# Patient Record
Sex: Male | Born: 1940 | Race: White | Hispanic: No | Marital: Married | Smoking: Former smoker
Health system: Southern US, Community
[De-identification: ages and names within clinical notes are randomized; demographics above are authoritative.]

## PROBLEM LIST (undated history)

## (undated) DIAGNOSIS — Z8679 Personal history of other diseases of the circulatory system: Secondary | ICD-10-CM

## (undated) DIAGNOSIS — H35039 Hypertensive retinopathy, unspecified eye: Secondary | ICD-10-CM

## (undated) DIAGNOSIS — F32A Depression, unspecified: Secondary | ICD-10-CM

## (undated) DIAGNOSIS — I43 Cardiomyopathy in diseases classified elsewhere: Secondary | ICD-10-CM

## (undated) DIAGNOSIS — I5032 Chronic diastolic (congestive) heart failure: Secondary | ICD-10-CM

## (undated) DIAGNOSIS — K219 Gastro-esophageal reflux disease without esophagitis: Secondary | ICD-10-CM

## (undated) DIAGNOSIS — I2510 Atherosclerotic heart disease of native coronary artery without angina pectoris: Secondary | ICD-10-CM

## (undated) DIAGNOSIS — M1990 Unspecified osteoarthritis, unspecified site: Secondary | ICD-10-CM

## (undated) DIAGNOSIS — Z8719 Personal history of other diseases of the digestive system: Secondary | ICD-10-CM

## (undated) DIAGNOSIS — I484 Atypical atrial flutter: Secondary | ICD-10-CM

## (undated) DIAGNOSIS — Q2112 Patent foramen ovale: Secondary | ICD-10-CM

## (undated) DIAGNOSIS — L0390 Cellulitis, unspecified: Secondary | ICD-10-CM

## (undated) DIAGNOSIS — I119 Hypertensive heart disease without heart failure: Secondary | ICD-10-CM

## (undated) DIAGNOSIS — N183 Chronic kidney disease, stage 3 unspecified: Secondary | ICD-10-CM

## (undated) DIAGNOSIS — K5790 Diverticulosis of intestine, part unspecified, without perforation or abscess without bleeding: Secondary | ICD-10-CM

## (undated) DIAGNOSIS — D649 Anemia, unspecified: Secondary | ICD-10-CM

## (undated) DIAGNOSIS — D3500 Benign neoplasm of unspecified adrenal gland: Secondary | ICD-10-CM

## (undated) DIAGNOSIS — J90 Pleural effusion, not elsewhere classified: Secondary | ICD-10-CM

## (undated) DIAGNOSIS — F419 Anxiety disorder, unspecified: Secondary | ICD-10-CM

## (undated) DIAGNOSIS — Z86010 Personal history of colonic polyps: Secondary | ICD-10-CM

## (undated) DIAGNOSIS — Q211 Atrial septal defect: Secondary | ICD-10-CM

## (undated) DIAGNOSIS — E785 Hyperlipidemia, unspecified: Secondary | ICD-10-CM

## (undated) DIAGNOSIS — E119 Type 2 diabetes mellitus without complications: Secondary | ICD-10-CM

## (undated) DIAGNOSIS — G4733 Obstructive sleep apnea (adult) (pediatric): Secondary | ICD-10-CM

## (undated) DIAGNOSIS — M109 Gout, unspecified: Secondary | ICD-10-CM

## (undated) DIAGNOSIS — Z9289 Personal history of other medical treatment: Secondary | ICD-10-CM

## (undated) DIAGNOSIS — Z860101 Personal history of adenomatous and serrated colon polyps: Secondary | ICD-10-CM

## (undated) DIAGNOSIS — J9690 Respiratory failure, unspecified, unspecified whether with hypoxia or hypercapnia: Secondary | ICD-10-CM

## (undated) DIAGNOSIS — Z9889 Other specified postprocedural states: Secondary | ICD-10-CM

## (undated) DIAGNOSIS — C4491 Basal cell carcinoma of skin, unspecified: Secondary | ICD-10-CM

## (undated) DIAGNOSIS — Q263 Partial anomalous pulmonary venous connection: Secondary | ICD-10-CM

## (undated) DIAGNOSIS — I4819 Other persistent atrial fibrillation: Secondary | ICD-10-CM

## (undated) DIAGNOSIS — I10 Essential (primary) hypertension: Secondary | ICD-10-CM

## (undated) DIAGNOSIS — H3530 Unspecified macular degeneration: Secondary | ICD-10-CM

## (undated) DIAGNOSIS — F329 Major depressive disorder, single episode, unspecified: Secondary | ICD-10-CM

## (undated) HISTORY — DX: Unspecified macular degeneration: H35.30

## (undated) HISTORY — DX: Anemia, unspecified: D64.9

## (undated) HISTORY — DX: Unspecified osteoarthritis, unspecified site: M19.90

## (undated) HISTORY — DX: Major depressive disorder, single episode, unspecified: F32.9

## (undated) HISTORY — PX: OTHER SURGICAL HISTORY: SHX169

## (undated) HISTORY — PX: EYE SURGERY: SHX253

## (undated) HISTORY — PX: GREAT TOE ARTHRODESIS, INTERPHALANGEAL JOINT: SUR55

## (undated) HISTORY — DX: Depression, unspecified: F32.A

## (undated) HISTORY — PX: RETINAL DETACHMENT SURGERY: SHX105

## (undated) HISTORY — DX: Obstructive sleep apnea (adult) (pediatric): G47.33

## (undated) HISTORY — DX: Personal history of colonic polyps: Z86.010

## (undated) HISTORY — PX: POLYPECTOMY: SHX149

## (undated) HISTORY — DX: Type 2 diabetes mellitus without complications: E11.9

## (undated) HISTORY — DX: Partial anomalous pulmonary venous connection: Q26.3

## (undated) HISTORY — DX: Chronic diastolic (congestive) heart failure: I50.32

## (undated) HISTORY — DX: Essential (primary) hypertension: I10

## (undated) HISTORY — DX: Other persistent atrial fibrillation: I48.19

## (undated) HISTORY — DX: Diverticulosis of intestine, part unspecified, without perforation or abscess without bleeding: K57.90

## (undated) HISTORY — DX: Hypertensive heart disease without heart failure: I11.9

## (undated) HISTORY — DX: Cardiomyopathy in diseases classified elsewhere: I43

## (undated) HISTORY — DX: Personal history of adenomatous and serrated colon polyps: Z86.0101

## (undated) HISTORY — DX: Hyperlipidemia, unspecified: E78.5

## (undated) HISTORY — DX: Hypertensive retinopathy, unspecified eye: H35.039

## (undated) HISTORY — PX: TRAPEZIUM RESECTION: SHX2576

## (undated) HISTORY — PX: CATARACT EXTRACTION: SUR2

## (undated) HISTORY — PX: CARDIAC CATHETERIZATION: SHX172

## (undated) HISTORY — PX: BASAL CELL CARCINOMA EXCISION: SHX1214

## (undated) HISTORY — DX: Atypical atrial flutter: I48.4

---

## 1997-12-31 ENCOUNTER — Ambulatory Visit: Admission: RE | Admit: 1997-12-31 | Discharge: 1997-12-31 | Payer: Self-pay | Admitting: Internal Medicine

## 1999-01-06 DIAGNOSIS — K5790 Diverticulosis of intestine, part unspecified, without perforation or abscess without bleeding: Secondary | ICD-10-CM

## 1999-01-06 HISTORY — DX: Diverticulosis of intestine, part unspecified, without perforation or abscess without bleeding: K57.90

## 1999-11-03 ENCOUNTER — Encounter (INDEPENDENT_AMBULATORY_CARE_PROVIDER_SITE_OTHER): Payer: Self-pay

## 1999-11-03 ENCOUNTER — Encounter: Payer: Self-pay | Admitting: Gastroenterology

## 1999-11-03 ENCOUNTER — Other Ambulatory Visit: Admission: RE | Admit: 1999-11-03 | Discharge: 1999-11-03 | Payer: Self-pay | Admitting: Gastroenterology

## 1999-11-04 ENCOUNTER — Ambulatory Visit (HOSPITAL_COMMUNITY): Admission: RE | Admit: 1999-11-04 | Discharge: 1999-11-04 | Payer: Self-pay | Admitting: Orthopedic Surgery

## 1999-11-04 ENCOUNTER — Encounter: Payer: Self-pay | Admitting: Orthopedic Surgery

## 1999-12-16 ENCOUNTER — Ambulatory Visit (HOSPITAL_COMMUNITY): Admission: RE | Admit: 1999-12-16 | Discharge: 1999-12-16 | Payer: Self-pay | Admitting: Orthopedic Surgery

## 1999-12-16 ENCOUNTER — Encounter: Payer: Self-pay | Admitting: Orthopedic Surgery

## 2001-03-08 ENCOUNTER — Encounter: Payer: Self-pay | Admitting: Cardiology

## 2001-03-08 ENCOUNTER — Ambulatory Visit (HOSPITAL_COMMUNITY): Admission: RE | Admit: 2001-03-08 | Discharge: 2001-03-08 | Payer: Self-pay | Admitting: Cardiology

## 2002-05-02 ENCOUNTER — Encounter: Payer: Self-pay | Admitting: *Deleted

## 2002-05-02 ENCOUNTER — Emergency Department (HOSPITAL_COMMUNITY): Admission: EM | Admit: 2002-05-02 | Discharge: 2002-05-02 | Payer: Self-pay | Admitting: *Deleted

## 2003-09-07 ENCOUNTER — Encounter: Payer: Self-pay | Admitting: Gastroenterology

## 2004-07-29 ENCOUNTER — Ambulatory Visit (HOSPITAL_COMMUNITY): Admission: RE | Admit: 2004-07-29 | Discharge: 2004-07-29 | Payer: Self-pay | Admitting: Cardiology

## 2005-05-18 ENCOUNTER — Inpatient Hospital Stay (HOSPITAL_COMMUNITY): Admission: AD | Admit: 2005-05-18 | Discharge: 2005-05-22 | Payer: Self-pay | Admitting: Cardiology

## 2006-06-12 ENCOUNTER — Encounter: Admission: RE | Admit: 2006-06-12 | Discharge: 2006-06-12 | Payer: Self-pay | Admitting: Cardiology

## 2006-10-01 ENCOUNTER — Emergency Department (HOSPITAL_COMMUNITY): Admission: EM | Admit: 2006-10-01 | Discharge: 2006-10-01 | Payer: Self-pay | Admitting: Emergency Medicine

## 2006-12-10 ENCOUNTER — Ambulatory Visit (HOSPITAL_COMMUNITY): Admission: RE | Admit: 2006-12-10 | Discharge: 2006-12-10 | Payer: Self-pay | Admitting: Internal Medicine

## 2006-12-11 ENCOUNTER — Encounter: Payer: Self-pay | Admitting: Gastroenterology

## 2006-12-14 ENCOUNTER — Ambulatory Visit: Admission: RE | Admit: 2006-12-14 | Discharge: 2006-12-14 | Payer: Self-pay | Admitting: Internal Medicine

## 2006-12-20 ENCOUNTER — Ambulatory Visit (HOSPITAL_COMMUNITY): Admission: RE | Admit: 2006-12-20 | Discharge: 2006-12-20 | Payer: Self-pay | Admitting: Internal Medicine

## 2006-12-21 ENCOUNTER — Encounter: Payer: Self-pay | Admitting: Gastroenterology

## 2006-12-21 ENCOUNTER — Ambulatory Visit (HOSPITAL_COMMUNITY): Admission: RE | Admit: 2006-12-21 | Discharge: 2006-12-21 | Payer: Self-pay | Admitting: Gastroenterology

## 2006-12-24 ENCOUNTER — Ambulatory Visit: Payer: Self-pay | Admitting: Gastroenterology

## 2007-01-01 ENCOUNTER — Emergency Department (HOSPITAL_COMMUNITY): Admission: EM | Admit: 2007-01-01 | Discharge: 2007-01-02 | Payer: Self-pay | Admitting: Emergency Medicine

## 2007-01-03 ENCOUNTER — Ambulatory Visit: Payer: Self-pay | Admitting: Gastroenterology

## 2007-01-03 LAB — CONVERTED CEMR LAB
Basophils Absolute: 0 10*3/uL (ref 0.0–0.1)
Basophils Relative: 0.4 % (ref 0.0–1.0)
Eosinophils Relative: 0.9 % (ref 0.0–5.0)
HCT: 27.7 % — ABNORMAL LOW (ref 39.0–52.0)
Monocytes Absolute: 0.6 10*3/uL (ref 0.2–0.7)
Neutrophils Relative %: 72.9 % (ref 43.0–77.0)
Platelets: 265 10*3/uL (ref 150–400)
RDW: 12.5 % (ref 11.5–14.6)
WBC: 10.6 10*3/uL — ABNORMAL HIGH (ref 4.5–10.5)

## 2007-01-04 ENCOUNTER — Ambulatory Visit: Payer: Self-pay | Admitting: Gastroenterology

## 2007-01-04 LAB — CONVERTED CEMR LAB
Basophils Absolute: 0 10*3/uL (ref 0.0–0.1)
Calcium: 8.8 mg/dL (ref 8.4–10.5)
Chloride: 104 meq/L (ref 96–112)
Eosinophils Absolute: 0.1 10*3/uL (ref 0.0–0.6)
GFR calc non Af Amer: 89 mL/min
HCT: 26.1 % — ABNORMAL LOW (ref 39.0–52.0)
Monocytes Relative: 4.6 % (ref 3.0–11.0)
RBC: 2.87 M/uL — ABNORMAL LOW (ref 4.22–5.81)
RDW: 12.6 % (ref 11.5–14.6)
WBC: 7.9 10*3/uL (ref 4.5–10.5)

## 2007-02-19 ENCOUNTER — Emergency Department (HOSPITAL_COMMUNITY): Admission: EM | Admit: 2007-02-19 | Discharge: 2007-02-19 | Payer: Self-pay | Admitting: Emergency Medicine

## 2007-03-15 ENCOUNTER — Encounter: Admission: RE | Admit: 2007-03-15 | Discharge: 2007-03-15 | Payer: Self-pay | Admitting: Cardiology

## 2008-08-30 ENCOUNTER — Telehealth: Payer: Self-pay | Admitting: Gastroenterology

## 2008-08-31 ENCOUNTER — Telehealth: Payer: Self-pay | Admitting: Gastroenterology

## 2008-09-03 ENCOUNTER — Ambulatory Visit: Payer: Self-pay | Admitting: Internal Medicine

## 2008-09-03 DIAGNOSIS — Z860100 Personal history of colon polyps, unspecified: Secondary | ICD-10-CM | POA: Insufficient documentation

## 2008-09-03 DIAGNOSIS — E782 Mixed hyperlipidemia: Secondary | ICD-10-CM

## 2008-09-03 DIAGNOSIS — K5730 Diverticulosis of large intestine without perforation or abscess without bleeding: Secondary | ICD-10-CM | POA: Insufficient documentation

## 2008-09-03 DIAGNOSIS — I480 Paroxysmal atrial fibrillation: Secondary | ICD-10-CM

## 2008-09-04 ENCOUNTER — Ambulatory Visit: Payer: Self-pay | Admitting: Gastroenterology

## 2008-09-04 ENCOUNTER — Ambulatory Visit (HOSPITAL_COMMUNITY): Admission: RE | Admit: 2008-09-04 | Discharge: 2008-09-04 | Payer: Self-pay | Admitting: Gastroenterology

## 2008-11-16 ENCOUNTER — Encounter: Payer: Self-pay | Admitting: Gastroenterology

## 2008-12-03 ENCOUNTER — Telehealth: Payer: Self-pay | Admitting: Gastroenterology

## 2008-12-17 ENCOUNTER — Ambulatory Visit: Payer: Self-pay | Admitting: Gastroenterology

## 2009-02-04 ENCOUNTER — Ambulatory Visit (HOSPITAL_COMMUNITY): Admission: RE | Admit: 2009-02-04 | Discharge: 2009-02-04 | Payer: Self-pay | Admitting: Internal Medicine

## 2009-09-03 ENCOUNTER — Ambulatory Visit: Payer: Self-pay | Admitting: Cardiology

## 2009-09-05 ENCOUNTER — Ambulatory Visit: Payer: Self-pay | Admitting: Cardiology

## 2010-03-07 ENCOUNTER — Ambulatory Visit (INDEPENDENT_AMBULATORY_CARE_PROVIDER_SITE_OTHER): Payer: Medicare Other | Admitting: Cardiology

## 2010-03-07 DIAGNOSIS — I10 Essential (primary) hypertension: Secondary | ICD-10-CM

## 2010-03-07 DIAGNOSIS — I4891 Unspecified atrial fibrillation: Secondary | ICD-10-CM

## 2010-05-08 ENCOUNTER — Other Ambulatory Visit: Payer: Self-pay | Admitting: *Deleted

## 2010-05-08 MED ORDER — VALSARTAN 320 MG PO TABS: 320.0000 mg | ORAL_TABLET | Freq: Every day | ORAL | Status: DC

## 2010-05-08 NOTE — Telephone Encounter (Signed)
Fax received from pharmacy. Refill completed. Jodette Marcie Shearon RN  

## 2010-05-20 NOTE — Assessment & Plan Note (Signed)
[name] OFFICE NOTE   NAME:BECKKamali, Meng                         MRN:          [id number]  DATE:01/04/2007                            DOB:          Aug 09, 1940    PRIMARY GASTROENTEROLOGIST:  Dr. Alben Spittle.   Mr. Normoyle feels great today.  He has no lightheadedness, no shortness of  breath, no chest pressures.  He has not had any rectal bleeding in over  24 hours.  The last was about 10 a.m. yesterday before he saw me in the  office.  He got a CBC this morning, and it shows his hemoglobin is 9.1  which is down from 9.8 yesterday and down from 14 over the weekend.  His  basic metabolic profile was normal.  He is out with his friends  currently, and I contacted him on his cell phone.   The bleeding has clinically stopped.  He feels well and has no organ  symptoms from his anemia.  I suspect holding the 4-8 aspirin a day has  made the difference and allowed him to achieve hemostasis.  He knows to  get in touch if he has any further bleeding or if he is just simply  feeling poorly.     Milus Banister, MD  Electronically Signed    DPJ/MedQ  DD: 01/04/2007  DT: 01/04/2007  Job #: [id number]   cc:   Sandy Salaam. Deatra Ina, MD,FACG

## 2010-05-20 NOTE — Assessment & Plan Note (Signed)
[place] OFFICE NOTE   NAME:[name], [name]                         MRN:          [id number]  DATE:01/03/2007                            DOB:          1940-10-16    PRIMARY CARE PHYSICIAN:  [name], M.D.   PRIMARY GASTROENTEROLOGIST:  Sandy Salaam. Deatra Ina, M.D.   GI PROBLEM LIST:  1. Family history of colorectal cancer. Status post colonoscopy      December 2008. A single polyp was removed. I do not have the path      back from that yet.  2. Foreign body removal from colonoscopy December 21, 2006. The      patient swallowed his dental bridge and it was lodged in his      terminal ileum. This was removed with a Roth basket by Dr. Erskine Emery.   INTERVAL HISTORY:  Mr. Mccredie was last seen with weeks ago by Dr. Erskine Emery for foreign body removal from his colon. He had swallowed a  dental bridge and Dr. Deatra Ina removed it with a Jabier Mutton basket from his  terminal ileum. He also removed a 6-mm sessile polyp with snare and  cautery method. Mr. Sedivy takes 4-8 aspirin a day every day for severe  arthritis pains. He began having overt red rectal bleeding two days ago.  He presented to the emergency room at [facility]. Hemoglobin was found  to be 14, platelets 269. A basic metabolic profile was essentially  normal. Complete metabolic profile was normal. He was sent home and  asked to followup here as he was hemodynamically stable. He has  continued to have red rectal bleeding. He believes it is slowing down.  He said he was going every 2 hours for a while but has not had to move  his bowel and has had no urge to move his bowel for about 5-6 hours. He  has no chest pain, no shortness of breath, no light-headedness when  standing and no dizziness. He had a CBC done just prior to this visit  showing his hemoglobin was now 9.8 (down from 14 two days ago).   CURRENT MEDICATIONS:  Amlodipine, clonidine,  glimepiride, Diovan,  metformin, Minoxidil, potassium, Tikosyn, Lipitor, calcium,  diphenhydramine, vitamin D3, aspirin 325 mg 4-8 a day.   PHYSICAL EXAMINATION:  VITAL SIGNS:  Blood pressure sitting 124/66,  supine 120/60, standing 102/60, heart rate 80s.  CONSTITUTIONAL:  Generally well- appearing.  NEUROLOGIC:  Alert and oriented x3.  ABDOMEN:  Soft, nontender, nondistended, normal bowel sounds.  EXTREMITIES:  No lower extremity edema.   ASSESSMENT/PLAN:  This is a 70 year old man with red rectal bleeding  post polypectomy two weeks.   His 4-8 aspirin a day are undoubtedly contributing to his rectal  bleeding. He stopped the aspirin Saturday and has not had anymore. He  has no orthostatic symptoms and although his hemoglobin has dropped 4  grams in 48 hours, he has not had a bloody bowel movement in 5-6 hours  now, feels well and does not feel like being admitted to  the hospital.  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 He knows that  I am on call and I have instructed him to get another CBC as well as a  basic metabolic profile tomorrow morning here at [facility] and he will  call us in the morning with the report on his bleeding. He knows that if  he begins having more frequent bleeding again to get in touch with me  tonight and my plan at that point would be to admit him to the hospital  and potentially observe him versus prepping him for a colonoscopy.     Milus Banister, MD  Electronically Signed    DPJ/MedQ  DD: 01/03/2007  DT: 01/03/2007  Job #: [id number]   cc:   [name], M.D.  Sandy Salaam. Deatra Ina, MD,FACG

## 2010-05-23 NOTE — Discharge Summary (Signed)
[name], WELL NO.:  [id number]   MEDICAL RECORD NO.:  [id number]          PATIENT TYPE:  INP   LOCATION:  3706                         FACILITY:  [facility]   PHYSICIAN:  Ludwig Lean. Doreatha Lew, M.D.DATE OF BIRTH:  1940/08/02   DATE OF ADMISSION:  05/18/2005  DATE OF DISCHARGE:  05/22/2005                                 DISCHARGE SUMMARY   PRIMARY DISCHARGE DIAGNOSIS:  Atrial fibrillation, subsequently loaded with  Tikosyn and with conversion to normal sinus rhythm with first degree AV  block.   SECONDARY DISCHARGE DIAGNOSES:  1.  Hypertensive heart disease.  2.  Chronic Coumadin therapy.  3.  Noninsulin-dependent diabetes.  4.  Hyperlipidemia.  5.  History of atherosclerotic cardiovascular disease, managed medically.  6.  Past history of short-lived results with cardioversion in July 2006.   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  He has had a history of atrial fibrillation in the past  and has had referral to Dr. Tally Due at [facility] for the possibility of  ablation.  It has been suggested a trial of antiarrhythmics with Tikosyn as  well as possible attempts at repeat electrical cardioversion should be  carried out.  He was subsequently planned for admission earlier this year;  however, we have had difficulty in maintaining therapeutic INRs.  Over this  past month his INR has been therapeutic in the 2-3 range and he was  subsequently admitted for elective loading with Tikosyn.   Please see the dictated history and physical for the patient presentation  and profile.   LABORATORY DATA:  On admission his magnesium was 2.3.  INR was 2.6.  CBC was  normal.  Chemistry showed a sodium of 138, potassium was 4.0, BUN 14,  creatinine 1, glucose was 118.  EKG showed atrial fibrillation initially  with satisfactory intervals.  His chest x-ray showed cardiac enlargement  with mild vascular congestion and no  acute heart failure.  There was mild  chronic lung disease.   HOSPITAL COURSE:  The patient was admitted.  Tikosyn orders were initiated  and the patient was subsequently started on 500 mg q.12h.  This dose was  tolerated well.  We did have a drop in potassium level, and this has been  replaced accordingly.  He has had one short run of seven beats of  ventricular tachycardia in the early morning hours on May 21, 2005, without  recurrence.  He has been asymptomatic.  His overall physical exam is  unremarkable.  Today on May 22, 2005, he is doing well without complaints.  He has had no further episodes of arrhythmia.  He has converted to normal  sinus rhythm with first degree AV block, his rate in the 70s.  His Toprol  has been discontinued.  Potassium is at 4.1.  QT intervals are satisfactory,  and he is felt to be a stable candidate for discharge today.   DISCHARGE CONDITION:  Stable.   DISCHARGE MEDICATIONS:  1.  We will be stopping Toprol.  2.  He is to  resume his Coumadin 10 mg Monday, Wednesday, Friday, 7.5 mg the      other days.  3.  Norvasc 5 mg two times a day.  4.  Diovan 320 mg a day.  5.  Amaryl 1 mg a day.  6.  Lipitor 40 mg three times a week.  7.  Minoxidil 10 mg at bedtime.  8.  Glucophage XR 1000 mg b.i.d.  9.  Tikosyn 500 mcg every 12 hours.  10. Potassium 20 mEq b.i.d.   His activity is not to be restricted.  His diet is diabetic heart-healthy.   We will plan on checking his chemistries in the office on Monday.  We will  plan on seeing him back in less than one week with a repeat EKG and repeat  lab work at that time.  He is to call if any problems arise in the interim.      [name], N.P.      Ludwig Lean. Doreatha Lew, M.D.  Electronically Signed    LC/MEDQ  D:  05/22/2005  T:  05/22/2005  Job:  [id number]   cc:   Tally Due, M.D.  [facility]   Tammi Sou, MD  Fax: [phone]

## 2010-05-23 NOTE — Procedures (Signed)
[place]  Patient:    [name], [name] Visit Number: [id number] MRN: [id number]          Service Type: CAT Location: [place] 01 Attending Physician:  Worthy Flank Dictated by:   Ludwig Lean Doreatha Lew, M.D. Proc. Date: 03/08/01 Admit Date:  03/08/2001 Discharge Date: 03/08/2001                             Procedure Report  REFERRING PHYSICIAN:  Kirtland Bouchard, M.D.  INDICATION FOR PROCEDURE:  Atrial fibrillation.  HISTORY OF PRESENT ILLNESS:  The patient is a 70 year old male with hypertensive heart disease. He was referred for evaluation and treatment of atrial fibrillation and flutter. He has been anticoagulated over the last month. He is referred for elective cardioversion.  PROCEDURE:  Elective cardioversion.  ANESTHESIA:  Ala Dach, M.D. with 300 mg Pentothal IV.  DESECRIPTION OF PROCEDURE:  With anterior and posterior paddles and biphasic shocks, 300 watt seconds of energy was delivered with conversion to normal sinus rhythm. The patient tolerated the procedure well. Dictated by:   Ludwig Lean Doreatha Lew, M.D. Attending Physician:  Worthy Flank DD:  03/08/01 TD:  03/08/01 Job: [id number]

## 2010-05-23 NOTE — H&P (Signed)
[name], [name] NO.:  [id number]   MEDICAL RECORD NO.:  [id number]           PATIENT TYPE:   LOCATION:                                 FACILITY:   PHYSICIAN:  Ludwig Lean. Doreatha Lew, M.D.    DATE OF BIRTH:   DATE OF ADMISSION:  05/18/2005  DATE OF DISCHARGE:                                HISTORY & PHYSICAL   CHIEF COMPLAINT:  None.   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  He has had a history of atrial fibrillation in the past.  He  has been reluctant to have antiarrhythmic therapy and has been referred for  ablative procedures at [facility] with Dr. Tally Due.  It has been suggested  to him that a trial with antiarrhythmic therapy with Tikosyn and possible  attempts at repeat electrical cardioversion prior to the ablation be carried  out.  He is now admitted for elective admission for Tikosyn with plans for  subsequent cardioversion.  We have initially attempted this procedure  earlier this year; however, we have had difficulty in maintaining  therapeutic INRs.  His Coumadin has now been therapeutic over the course of  the past one month.   PAST MEDICAL HISTORY:  1.  Atrial fibrillation.  He had previous cardioversion in July, 2006 with      short-lived results.  2.  Hypertensive heart disease with 2D echocardiogram in July, 2006 showing      moderate left atrial enlargement, moderate right atrial enlargement,      severe LVH, and normal LV systolic function.  3.  Chronic Coumadin.  4.  Non-insulin-dependent diabetes.  5.  Bilateral lens implants in 2000.  6.  Retinal surgery on the right eye in 1996.  7.  History of pilonidal cyst.  8.  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   ALLERGIES:  None.   CURRENT MEDICATIONS:  1.   Tylenol twice daily.  2.  Amaryl 1 mg a day.  3.  Minoxidil 10 mg a day.  4.  Diovan 320 a day.  5.  Paxil 10 mg a day.  6.  Norvasc 5 b.i.d.  7.  Toprol 100 b.i.d.  8.  Lipitor 40 mg 3 times a week only.  9.  Coumadin, currently taking 10 mg three days a week and 7.5 four days a      week.  10. Glucophage 1000 b.i.d.   FAMILY HISTORY:  Noncontributory.   SOCIAL HISTORY:  He is a retired Software engineer.  He has not smoked for over 25  years.  He uses social alcohol.   REVIEW OF SYSTEMS:  Otherwise unremarkable.   PHYSICAL EXAMINATION:  GENERAL:  He is a pleasant middle-aged male currently  in no acute distress.  VITAL SIGNS:  Blood pressure 130/90 sitting, 140/90 standing, heart rate 60  and irregular, respirations 18.  He  is afebrile.  SKIN:  Warm and dry.  Color is unremarkable.  LUNGS:  Clear.  HEART:  Irregularly irregular rhythm.  ABDOMEN:  Soft with positive bowel sounds.  Nontender.  EXTREMITIES:  Without edema.  NEUROLOGIC:  Intact.  There are no gross focal deficits.   Labs are pending.   IMPRESSION:  1.  Atrial fibrillation with plans for upcoming possible ablation.  2.  Chronic Coumadin.  3.  Hypertensive heart disease.  4.  History of atherosclerotic cardiovascular disease.  5.  Non-insulin-dependent diabetes.   PLAN:  He will be admitted on Monday, May 18, 2005 for [place] with Tikosyn.  Plans are made to have tentative cardioversion on Wednesday, May 16th, case  [id number].  Home medicines will be continued.      [name], N.P.      Ludwig Lean. Doreatha Lew, M.D.  Electronically Signed    LC/MEDQ  D:  05/06/2005  T:  05/06/2005  Job:  [id number]   cc:   Tammi Sou, MD  Fax: [phone]   Dr. Tally Due  [facility]

## 2010-05-23 NOTE — Cardiovascular Report (Signed)
NAMENEKHI, OEN NO.:  [id number]   MEDICAL RECORD NO.:  [id number]          PATIENT TYPE:  OIB   LOCATION:  2860                         FACILITY:  [facility]   PHYSICIAN:  Ludwig Lean. Doreatha Lew, M.D.DATE OF BIRTH:  Oct 27, 1940   DATE OF PROCEDURE:  07/29/2004  DATE OF DISCHARGE:                              CARDIAC CATHETERIZATION   PROCEDURE:  Cardioversion.   ANESTHESIA:  Dr. Jenita Seashore with 200 mg of Pentothal IV.   PROCEDURE:  Using anterior and posterior paddles with 150-watt seconds of  biphasic energy, the patient has converted from atrial fibrillation into  normal sinus rhythm.  He tolerated the procedure well.       SNT/MEDQ  D:  07/29/2004  T:  07/29/2004  Job:  [id number]

## 2010-05-23 NOTE — H&P (Signed)
NAMEDAUNDRE, HUNTSBERGER NO.:  [id number]   MEDICAL RECORD NO.:  [id number]          PATIENT TYPE:  OIB   LOCATION:                               FACILITY:  [facility]   PHYSICIAN:  Ludwig Lean. Doreatha Lew, M.D.DATE OF BIRTH:  [birth year]   DATE OF ADMISSION:  07/29/2004  DATE OF DISCHARGE:                                HISTORY & PHYSICAL   CHIEF COMPLAINT:  Fatigue.   HISTORY OF PRESENT ILLNESS:  Mr. Baggott is a very pleasant 70 year old white  male. He has had recurrent atrial fibrillation.  He has been placed back on  Coumadin anticoagulation.  He presents for attempts at elective  cardioversion to restore normal sinus rhythm.  Clinically, he has had  problems with ongoing weakness and fatigue.  He has had no lightheadedness,  no dizziness, no syncope. He has had no chest pain or shortness of breath.   PAST MEDICAL HISTORY:  1.  Prior history of atrial fibrillation.  2.  Past history of Coumadin anticoagulation.  3.  Diabetes.  4.  Previous history of cardiac catheterization dating back to 1996      secondary to chest pain.  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  5.  Chronic PVC's.  6.  Hypertension.  7.  Non-insulin dependent diabetes.  8.  ACE intolerance secondary to cough.   ALLERGIES:  NONE KNOWN.   CURRENT MEDICATIONS:  1.  Coumadin 7.5 x6, 5 x1.  2.  Baby aspirin daily.  3.  Lipitor 40 mg three times a week.  4.  Toprol XL 100 mg b.i.d.  5.  Norvasc 5 b.i.d.  6.  Paxil 20 mg a day.  7.  Glucophage 1000 mg b.i.d.  8.  Diovan 320 mg a day.  9.  Minoxidil 10 mg at bedtime.   FAMILY HISTORY:  Father and mother have both lived up into their 63's.  There is no significant cardiovascular disease.  He does have a significant  family history of colon cancer.   SOCIAL HISTORY:  He is  currently retired as a Software engineer.  He is married.   REVIEW OF SYSTEMS:  As noted above.  There may be some degree of shortness  of breath with overexertion.  No symptoms at rest.  He has had no paroxysmal  nocturnal dyspnea, no orthopnea.  No complaints of edema.  He is not  lightheaded or dizzy.  He does have problems with ongoing weakness and  fatigue.  He does have an awareness of his recurrent arrhythmia.   PHYSICAL EXAMINATION:  GENERAL:  On exam, he is a very pleasant male in no  acute distress.  VITAL SIGNS:  Blood pressure is 130/80 sitting, 130/70 standing, heart rate  72 and irregular.  His weight is 210 pounds.  SKIN:  Warm and dry.  Color is unremarkable.  LUNGS:  Basically clear.  CARDIAC:  Exam shows  an irregular irregular rhythm.  There is no murmur.  ABDOMEN:  Soft.  Positive bowel sounds.  Nontender.  EXTREMITIES:  Without edema.  NEUROLOGICAL:  Intact.  There are no gross focal deficits.   PERTINENT LABORATORIES:  Pending.   OVERALL IMPRESSION:  1.  Recurrent atrial fibrillation.  2.  Coumadin anticoagulation.  3.  Past history of cardiac catheterization.  4.  Hypertension, currently controlled.  5.  Diabetes.   PLAN:  Will proceed on with attempts at cardioversion in order to restore  sinus rhythm.  The procedure has been reviewed in full detail and he is  willing to proceed on Tuesday, July 29, 2004.       LC/MEDQ  D:  07/25/2004  T:  07/25/2004  Job:  [id number]   cc:   [name], M.D.  [address], [place]  [place], [place] [address]  Fax: [phone]

## 2010-05-28 ENCOUNTER — Other Ambulatory Visit: Payer: Self-pay | Admitting: *Deleted

## 2010-05-28 MED ORDER — MINOXIDIL 10 MG PO TABS: ORAL_TABLET | ORAL | Status: DC

## 2010-05-28 NOTE — Telephone Encounter (Signed)
Lotensin refilled

## 2010-06-04 ENCOUNTER — Encounter: Payer: Self-pay | Admitting: Physician Assistant

## 2010-06-04 ENCOUNTER — Ambulatory Visit (INDEPENDENT_AMBULATORY_CARE_PROVIDER_SITE_OTHER): Payer: Medicare Other | Admitting: Physician Assistant

## 2010-06-04 ENCOUNTER — Telehealth: Payer: Self-pay | Admitting: Gastroenterology

## 2010-06-04 ENCOUNTER — Other Ambulatory Visit (INDEPENDENT_AMBULATORY_CARE_PROVIDER_SITE_OTHER): Payer: Medicare Other | Admitting: Physician Assistant

## 2010-06-04 ENCOUNTER — Other Ambulatory Visit (INDEPENDENT_AMBULATORY_CARE_PROVIDER_SITE_OTHER): Payer: Medicare Other

## 2010-06-04 VITALS — BP 128/64 | HR 72 | Temp 99.1°F | Ht 71.0 in | Wt 205.0 lb

## 2010-06-04 DIAGNOSIS — K6289 Other specified diseases of anus and rectum: Secondary | ICD-10-CM

## 2010-06-04 DIAGNOSIS — N410 Acute prostatitis: Secondary | ICD-10-CM

## 2010-06-04 LAB — URINALYSIS, ROUTINE W REFLEX MICROSCOPIC
Ketones, ur: NEGATIVE
Specific Gravity, Urine: >=1.03 (ref 1.000–1.030)
Total Protein, Urine: 30
Urine Glucose: 500
pH: 5.5 (ref 5.0–8.0)

## 2010-06-04 LAB — CBC WITH DIFFERENTIAL/PLATELET
Basophils Relative: 0.5 % (ref 0.0–3.0)
Eosinophils Relative: 3.2 % (ref 0.0–5.0)
Hemoglobin: 13.6 g/dL (ref 13.0–17.0)
Lymphocytes Relative: 22 % (ref 12.0–46.0)
MCHC: 34.6 g/dL (ref 30.0–36.0)
Monocytes Relative: 13 % — ABNORMAL HIGH (ref 3.0–12.0)
Neutro Abs: 3.3 10*3/uL (ref 1.4–7.7)
RBC: 4.21 Mil/uL — ABNORMAL LOW (ref 4.22–5.81)

## 2010-06-04 MED ORDER — AMOXICILLIN 250 MG PO CAPS: 250.0000 mg | ORAL_CAPSULE | Freq: Four times a day (QID) | ORAL | Status: AC

## 2010-06-04 NOTE — Patient Instructions (Addendum)
We have sent a prescription for Amoxicillin to [facility]. We will call you once we get the lab results back.

## 2010-06-04 NOTE — Progress Notes (Signed)
Agree with initial assessment and plan 

## 2010-06-04 NOTE — Telephone Encounter (Signed)
Pt states that he is having the same symptoms that he had a couple of years ago when he saw Dr. Deatra Ina. He is c/o fever, perianal pain, lots of gas, and varying forms and types of stools. Pt states that Dr. Deatra Ina had instructed him to call and be seen if this happened again. Pt scheduled to see Amy Esterwood PA 06/04/10@3pm . Pt aware of appt date and time.

## 2010-06-04 NOTE — Progress Notes (Signed)
Subjective:    Patient ID: [name], male    DOB: 1940/10/26, 70 y.o.   MRN: [id number]  HPI [name] is a pleasant 70 year old white male known to Dr. Deatra Ina who has history of diverticulosis internal hemorrhoids and colon polyps. Last colonoscopy was done in August of 2010 showing mild diverticulosis and internal hemorrhoids with no recurrent polyps. Colonoscopy done in 2008 with removal of a dental bridge from the ileum and one polyp was removed which was benign. Patient comes in today as an acute work in after onset of illness on May 25 with fever and a diffuse body aches. He says he had intermittent fevers to 101 over the weekend which then started to subside these weren't this was associated with chills but he did not have any abdominal pain nausea vomiting diarrhea change in his bowel habits no cough or sputum production or any other localizing symptoms. He says he felt better on the 28th and then had fever come back again on the 29th. Today so far has not had a fever but says he feels hot currently here in the office. He noticed yesterday for the first time that he had some mild perirectal discomfort and perfect pressure after he though his lawn on the riding mower. He had  prior episodeS twice in the past with fever and perirectal discomfort with no definitive diagnosis. He says it was felt that he may have had prostatitis in the past and was treated with amoxicillin which alleviated his symptoms. The last episode was a couple of years ago. On further questioning he has had some mild dysuria as well over the past couple of days. He has not had any rectal bleeding.    Review of Systems  Constitutional: Positive for fever and chills.  HENT: Negative.   Eyes: Negative.   Respiratory: Negative.   Cardiovascular: Negative.   Gastrointestinal: Positive for rectal pain.  Genitourinary: Positive for dysuria.  Musculoskeletal: Negative.   Skin: Negative.   Neurological: Negative.   Hematological:  Negative.   Psychiatric/Behavioral: Negative.    Outpatient Encounter Prescriptions as of 06/04/2010  Medication Sig Dispense Refill  . acetaminophen (TYLENOL) 500 MG tablet Take 500 mg by mouth every 2 (two) hours as needed.        . ALPRAZolam (XANAX) 0.25 MG tablet Take 0.25 mg by mouth at bedtime as needed.        [name] AMLODIPINE BESYLATE PO Take 10 mg by mouth 2 (two) times daily.       [name] aspirin 325 MG tablet Take 2,600 mg by mouth daily.       [name] atorvastatin (LIPITOR) 20 MG tablet Take 20 mg by mouth daily. 3 times weekly       . calcium elemental as carbonate (CALCIUM ANTACID ULTRA MAX ST) 400 MG tablet Chew 1,000 mg by mouth daily.        . Cholecalciferol (VITAMIN D) 2000 UNITS CAPS Take 3 capsules by mouth daily.        . citalopram (CELEXA) 40 MG tablet Take 40 mg by mouth daily.        [name] CLONIDINE HCL PO Take 0.2 mg by mouth 3 (three) times daily.       . diphenhydrAMINE (BENADRYL) 25 MG tablet Take 25 mg by mouth every 6 (six) hours as needed.        . dofetilide (TIKOSYN) 500 MCG capsule Take 500 mcg by mouth 2 (two) times daily.       . furosemide (  LASIX) 20 MG tablet Take 20 mg by mouth every morning.       [name] glimepiride (AMARYL) 2 MG tablet Take 2 mg by mouth daily before breakfast.        . HYDROcodone-acetaminophen (VICODIN) 5-500 MG per tablet Take 1 tablet by mouth every 6 (six) hours as needed.        . metFORMIN (GLUCOPHAGE) 500 MG tablet Take 500 mg by mouth 2 (two) times daily with a meal. Take 2 tablets       . minoxidil (LONITEN) 10 MG tablet Take 2 tabs QHS  60 tablet  5  . Multiple Vitamin (MULTIVITAMIN) capsule Take 1 capsule by mouth daily.        . nitroGLYCERIN (NITROSTAT) 0.4 MG SL tablet Place 0.4 mg under the tongue every 5 (five) minutes as needed.        . potassium chloride SA (KLOR-CON M20) 20 MEQ tablet Take 20 mEq by mouth 3 (three) times daily.       . ranitidine (ZANTAC) 150 MG tablet Take 150 mg by mouth. 1 tab by mouth as needed.       . valsartan  (DIOVAN) 320 MG tablet Take 1 tablet (320 mg total) by mouth daily.  30 tablet  11  . Vitamins-Lipotropics (SUPER STRESS B-COMPLEX CR PO) Take by mouth.        [name] amoxicillin (AMOXIL) 250 MG capsule Take 1 capsule (250 mg total) by mouth 4 (four) times daily.  56 capsule  0  . DISCONTD: aliskiren (TEKTURNA) 300 MG tablet Take 300 mg by mouth at bedtime.        [name] DISCONTD: calcium-vitamin D (OSCAL WITH D) 500-200 MG-UNIT per tablet Take 1 tablet by mouth daily.             Objective:   Physical Exam Well-developed white male in no acute distress, pleasant, alert and oriented x3 HEENT nontraumatic normocephalic EOMI PERRLA sclera anicteric nECK; Supple no JVD  Cardiovascular; regular rate and rhythm with S1-S2 no significant murmur rub or gallop   Pulmonary; clear bilaterally   Abdomen; soft nontender nondistended bowel sounds active no palpable mass or hepatosplenomegaly  Rectal exam;  nontender to exam, no evidence for a perirectal inflammation or abscess prostate is somewhat enlarged and tender particularly on the left, stool trace heme positive. Psych; mood and affect are appropriate        Assessment & Plan:  #16 70 year old male with five-day history of fever generalized aching and 24-hour history of vague perirectal discomfort and dysuria. He has no evidence of any perirectal inflammation abscess Judithe Modest and exam is not consistent with diverticulitis. I suspect he does have prostatitis.  Plan; Check CBC and BMET, UA today Start amoxicillin 250 mg 4 times daily x14 days.  suggested patient be seen by urology.  #2 Diverticulosis  #3 History of colon polyps, and family history of colon cancer Last colonoscopy 2010 no recurrent polyps will be due for followup 2015.

## 2010-06-06 ENCOUNTER — Telehealth: Payer: Self-pay | Admitting: *Deleted

## 2010-06-06 NOTE — Telephone Encounter (Signed)
Message copied by Hulan Saas on Fri Jun 06, 2010  4:18 PM ------      Message from: Nicoletta Ba S      Created: Fri Jun 06, 2010  3:45 PM       Please call pt and see how he is feeling. Labs ok. I  Treated him for prostatitis

## 2010-06-06 NOTE — Telephone Encounter (Signed)
Results given. He is not having any more symptoms. He has gotten a sore throat which his PCP is treating.

## 2010-06-19 ENCOUNTER — Telehealth: Payer: Self-pay | Admitting: Gastroenterology

## 2010-06-19 NOTE — Telephone Encounter (Signed)
Pt states that he is still having problems with the pain that he was having and gas. States there is some mucous on the toilet paper when he wipes. Pt does not have a fever. He is requesting to be seen. Pt given an appt for 06/23/10@2pm . Pt aware of appt date and time.

## 2010-06-23 ENCOUNTER — Ambulatory Visit (INDEPENDENT_AMBULATORY_CARE_PROVIDER_SITE_OTHER): Payer: Medicare Other | Admitting: Nurse Practitioner

## 2010-06-23 ENCOUNTER — Encounter: Payer: Self-pay | Admitting: Nurse Practitioner

## 2010-06-23 VITALS — BP 124/60 | HR 80 | Ht 71.0 in | Wt 202.6 lb

## 2010-06-23 DIAGNOSIS — R109 Unspecified abdominal pain: Secondary | ICD-10-CM

## 2010-06-23 DIAGNOSIS — R102 Pelvic and perineal pain: Secondary | ICD-10-CM

## 2010-06-23 NOTE — Patient Instructions (Signed)
Pt is to call us on an as needed basis.

## 2010-06-23 NOTE — Progress Notes (Signed)
SHAYLON MANGAL [id number] 07-07-1940   HISTORY OR PRESENT ILLNESS : Mr. Worthington is a 70 year old male followed by Dr. Deatra Ina for history of diverticulosis and colon polyps. Patient was last seen in our office 06/04/10 for acute fever, vague perirectal discomfort and dysuria felt to be secondary to prostatitis. His CBC was unremarkable, urinalysis was negative for nitrites, leukocytes. He had 0-2 white blood cells and 0-2 RBCs in his urine.The patient was started on Amoxicillin. A couple of days later patient saw his PCP for a sore throat and there was discussion of patient's lower abdominal pain and dysuria as well. PCP apparently agreed that patient had prostatitis. Amoxicillin was discontinued, Doxycycline started. Patient called [place] on Friday with persistent "pelvic pain" and gas. He was given an appointment for today but explains that his symptoms actually abated over the weekend. No fevers, pain or dysuria.   Current Medications, Allergies, Past Medical History, Past Surgical History, Family History and Social History were reviewed in [employer] record.   PHYSICAL EXAMINATION : General: Well developed white male in no acute distress Head: Normocephalic and atraumatic Eyes:  sclerae anicteric,conjunctive pink. Ears: Normal auditory acuity Mouth: No deformity or lesions Neck: Supple, no masses.  Lungs: Clear throughout to auscultation Heart: Regular rate and rhythm; no murmurs heard Abdomen: Soft, nondistended, nontender. No masses or hepatomegaly noted. Normal bowel sounds Rectal: not done Musculoskeletal: Symmetrical with no gross deformities  Skin: No lesions on visible extremities Extremities: No edema or deformities noted Neurological: Alert oriented x 4, grossly nonfocal Cervical Nodes:  No significant cervical adenopathy Psychological:  Alert and cooperative. Normal mood and affect  ASSESSMENT AND PLAN :

## 2010-06-25 ENCOUNTER — Encounter: Payer: Self-pay | Admitting: Nurse Practitioner

## 2010-06-25 NOTE — Assessment & Plan Note (Signed)
Resolution of pelvic pain, dysuria and fevers after being treated empirically for prostatitis (we gave him Amoxicillin but PCP changed to Doxycycline). If symptoms recur patient will contact PCP for further evaluation.

## 2010-06-25 NOTE — Progress Notes (Signed)
Reviewed and agree.

## 2010-07-18 ENCOUNTER — Other Ambulatory Visit (HOSPITAL_COMMUNITY): Payer: Self-pay | Admitting: Internal Medicine

## 2010-07-18 ENCOUNTER — Ambulatory Visit (HOSPITAL_COMMUNITY)
Admission: RE | Admit: 2010-07-18 | Discharge: 2010-07-18 | Disposition: A | Discharge status: Home / Self Care | Payer: Medicare Other | Source: Ambulatory Visit | Attending: Internal Medicine | Admitting: Internal Medicine

## 2010-07-18 DIAGNOSIS — E119 Type 2 diabetes mellitus without complications: Secondary | ICD-10-CM | POA: Insufficient documentation

## 2010-07-18 DIAGNOSIS — I10 Essential (primary) hypertension: Secondary | ICD-10-CM | POA: Insufficient documentation

## 2010-07-18 DIAGNOSIS — Z77090 Contact with and (suspected) exposure to asbestos: Secondary | ICD-10-CM

## 2010-07-18 DIAGNOSIS — Z87891 Personal history of nicotine dependence: Secondary | ICD-10-CM | POA: Insufficient documentation

## 2010-08-05 ENCOUNTER — Other Ambulatory Visit: Payer: Self-pay | Admitting: *Deleted

## 2010-08-05 MED ORDER — DOFETILIDE 500 MCG PO CAPS: 500.0000 ug | ORAL_CAPSULE | Freq: Two times a day (BID) | ORAL | Status: DC

## 2010-08-05 NOTE — Telephone Encounter (Signed)
escribe medication per fax request  

## 2010-08-06 ENCOUNTER — Telehealth: Payer: Self-pay | Admitting: Cardiovascular Disease

## 2010-08-06 MED ORDER — DOFETILIDE 500 MCG PO CAPS: 500.0000 ug | ORAL_CAPSULE | Freq: Two times a day (BID) | ORAL | Status: DC

## 2010-08-06 NOTE — Telephone Encounter (Signed)
Former pt of Dr. Tawnya Crook, will be seeing Dr. Acie Fredrickson, pt states states [facility] in [place] called to tell patient they're not authorized to dispense , a script however it went to the wrong pharmacy at [place] on [address], this authorization needs to go to [facility] in [place], [place], please call pt to clarify explanation

## 2010-08-06 NOTE — Telephone Encounter (Signed)
Pt called and rx sent to other store.Corwin Levins RN

## 2010-08-19 ENCOUNTER — Encounter (INDEPENDENT_AMBULATORY_CARE_PROVIDER_SITE_OTHER): Payer: Medicare Other | Admitting: Ophthalmology

## 2010-08-21 ENCOUNTER — Encounter (INDEPENDENT_AMBULATORY_CARE_PROVIDER_SITE_OTHER): Payer: Medicare Other | Admitting: Ophthalmology

## 2010-08-21 DIAGNOSIS — H3530 Unspecified macular degeneration: Secondary | ICD-10-CM

## 2010-08-21 DIAGNOSIS — H33009 Unspecified retinal detachment with retinal break, unspecified eye: Secondary | ICD-10-CM

## 2010-08-21 DIAGNOSIS — H35379 Puckering of macula, unspecified eye: Secondary | ICD-10-CM

## 2010-08-21 DIAGNOSIS — E11319 Type 2 diabetes mellitus with unspecified diabetic retinopathy without macular edema: Secondary | ICD-10-CM

## 2010-09-04 ENCOUNTER — Encounter: Payer: Self-pay | Admitting: Cardiovascular Disease

## 2010-09-22 ENCOUNTER — Encounter: Payer: Self-pay | Admitting: Cardiovascular Disease

## 2010-09-22 ENCOUNTER — Ambulatory Visit (INDEPENDENT_AMBULATORY_CARE_PROVIDER_SITE_OTHER): Payer: Medicare Other | Admitting: Cardiovascular Disease

## 2010-09-22 VITALS — BP 146/78 | HR 68 | Wt 208.0 lb

## 2010-09-22 DIAGNOSIS — I4891 Unspecified atrial fibrillation: Secondary | ICD-10-CM

## 2010-09-22 NOTE — Progress Notes (Signed)
[name] Date of Birth  1940-05-03 [facility] [address]    [place], [place]  [address] [phone]  Fax  [phone]  History of Present Illness:  [name] is a 70 year old gentleman with a history of hypertension, atrial fibrillation and a myocardial bridge.  He is a previous patient of Dr. Patricia Pesa. He also has a history of diabetes. He has felt fairly well. He has not had any episodes of chest pain or shortness of breath.     Current Outpatient Prescriptions on File Prior to Visit  Medication Sig Dispense Refill  . acetaminophen (TYLENOL) 500 MG tablet Take 500 mg by mouth every 2 (two) hours as needed.        . ALPRAZolam (XANAX) 0.25 MG tablet Take by mouth at bedtime as needed. Taking 0.25mg  to 1mg  PRN      . AMLODIPINE BESYLATE PO Take 10 mg by mouth 2 (two) times daily.       [name] aspirin 325 MG tablet Take 325 mg by mouth. Taking 4 Tab BID      . atorvastatin (LIPITOR) 20 MG tablet Take 20 mg by mouth daily. 3 times weekly      . calcium elemental as carbonate (CALCIUM ANTACID ULTRA MAX ST) 400 MG tablet Chew by mouth. Taking PRN      . Cholecalciferol (VITAMIN D) 2000 UNITS CAPS Take 3 capsules by mouth daily.        . citalopram (CELEXA) 40 MG tablet Take 40 mg by mouth daily.        [name] CLONIDINE HCL PO Take 0.2 mg by mouth 3 (three) times daily.       . diphenhydrAMINE (BENADRYL) 25 MG tablet Take 25 mg by mouth every 6 (six) hours as needed.        . dofetilide (TIKOSYN) 500 MCG capsule Take 1 capsule (500 mcg total) by mouth 2 (two) times daily.  180 capsule  3  . furosemide (LASIX) 20 MG tablet Take 20 mg by mouth every morning.       [name] glimepiride (AMARYL) 2 MG tablet Take 2 mg by mouth daily before breakfast.        . HYDROcodone-acetaminophen (VICODIN) 5-500 MG per tablet Take 1 tablet by mouth every 6 (six) hours as needed.        . metFORMIN (GLUCOPHAGE) 500 MG tablet Take 500 mg by mouth 2 (two) times daily with a meal. Take 2 tablets      .  minoxidil (LONITEN) 10 MG tablet Take 2 tabs QHS  60 tablet  5  . Multiple Vitamin (MULTIVITAMIN) capsule Take 1 capsule by mouth daily.        . nitroGLYCERIN (NITROSTAT) 0.4 MG SL tablet Place 0.4 mg under the tongue every 5 (five) minutes as needed.        . potassium chloride SA (KLOR-CON M20) 20 MEQ tablet Take 20 mEq by mouth 3 (three) times daily.       . ranitidine (ZANTAC) 150 MG tablet Take 150 mg by mouth. 1 tab by mouth as needed.       . valsartan (DIOVAN) 320 MG tablet Take 1 tablet (320 mg total) by mouth daily.  30 tablet  11  . Vitamins-Lipotropics (SUPER STRESS B-COMPLEX CR PO) Take by mouth.          Allergies  Allergen Reactions  . Horse-Derived Products   . Other     Tetanus Shot    Past Medical History  Diagnosis Date  . Hx of adenomatous colonic polyps   . Diabetes mellitus type II   . Hypertension   . Diverticulosis 2001  . Hyperlipidemia   . Atrial fibrillation   . Joint pain   . Depression     Past Surgical History  Procedure Date  . Polinydal cyst     Removed  . Great toe arthrodesis, interphalangeal joint     Right foot  . Retina repair-right   . Cataract extraction     bilateral  . Basal cell carcinoma excision     x3 on face  . Polypectomy   . Cardiac catheterization     History  Smoking status  . Former Smoker  . Types: Cigarettes  . Quit date: 01/05/1981  Smokeless tobacco  . Never Used  Comment: Stopped 1983    History  Alcohol Use  . Yes    1-3 drinks per week    Family History  Problem Relation Age of Onset  . Colon cancer Mother     Family History/Uncle   . Colon polyps Mother     Family History  . Atrial fibrillation Mother   . Hypertension Mother   . Colon polyps Sister     Family history  . Diabetes Maternal Uncle   . Stroke Paternal Uncle   . Dementia Father     Reviw of Systems:  Reviewed in the HPI.  All other systems are negative.  Physical Exam: BP 146/78  Pulse 68  Wt 208 lb (94.348 kg) The  patient is alert and oriented x 3.  The mood and affect are normal.   Skin: warm and dry.  Color is normal.    HEENT:   the sclera are nonicteric.  The mucous membranes are moist.  The carotids are 2+ without bruits.  There is no thyromegaly.  There is no JVD.    Lungs: clear.  The chest wall is non tender.    Heart: regular rate with a normal S1 and S2.  There are no murmurs, gallops, or rubs. The PMI is not displaced.     Abdomen: good bowel sounds.  There is no guarding or rebound.  There is no hepatosplenomegaly or tenderness.  There are no masses.   Extremities:  no clubbing, cyanosis, or edema.  The legs are without rashes.  The distal pulses are intact.   Neuro:  Cranial nerves II - XII are intact.  Motor and sensory functions are intact.    The gait is normal.  ECG: Normal sinus rhythm. He has no ST or T wave changes. Assessment / Plan:

## 2010-09-22 NOTE — Assessment & Plan Note (Addendum)
His atrial fibrillation as well controlled.  His QTC has been normal.  His EKG today still shows a normal QTC.  He has seen Dr. Rolland Porter in [facility]. He will was going to consider an RF ablation. It was recommended that he try taking Tikosyn prior to the ablation.   That has worked so  well that he has not returned to [place] to have the ablation.  We'll continue the same medications. I'll see him again in 6 months.

## 2010-09-26 ENCOUNTER — Other Ambulatory Visit: Payer: Self-pay | Admitting: *Deleted

## 2010-09-26 MED ORDER — NITROGLYCERIN 0.4 MG SL SUBL: 0.4000 mg | SUBLINGUAL_TABLET | SUBLINGUAL | Status: DC | PRN

## 2010-09-26 NOTE — Telephone Encounter (Signed)
Refilled Meds from fax

## 2010-09-30 ENCOUNTER — Telehealth: Payer: Self-pay | Admitting: Cardiovascular Disease

## 2010-09-30 ENCOUNTER — Other Ambulatory Visit: Payer: Self-pay | Admitting: *Deleted

## 2010-09-30 MED ORDER — NITROGLYCERIN 0.4 MG SL SUBL: 0.4000 mg | SUBLINGUAL_TABLET | SUBLINGUAL | Status: DC | PRN

## 2010-09-30 NOTE — Telephone Encounter (Signed)
Original was sent to [facility], resent to [facility] [address], pt called and informed

## 2010-09-30 NOTE — Telephone Encounter (Signed)
Pt called and stated that phar had asked for refill on Friday for N[facility] [address]/[phone].  Please call this in ASAP

## 2010-10-07 ENCOUNTER — Encounter: Payer: Self-pay | Admitting: Cardiovascular Disease

## 2010-10-10 LAB — TYPE AND SCREEN
ABO/RH(D): O POS
Antibody Screen: NEGATIVE

## 2010-10-10 LAB — DIFFERENTIAL
Lymphocytes Relative: 23
Lymphs Abs: 1.6
Monocytes Absolute: 0.6
Monocytes Relative: 8
Neutro Abs: 4.6

## 2010-10-10 LAB — ABO/RH: ABO/RH(D): O POS

## 2010-10-10 LAB — CBC
HCT: 40.7
Hemoglobin: 14
MCHC: 34.5
MCV: 90.7
Platelets: 269
RDW: 13.5

## 2010-10-10 LAB — COMPREHENSIVE METABOLIC PANEL
Albumin: 4.2
BUN: 16
Calcium: 8.6
Creatinine, Ser: 1.03
Glucose, Bld: 276 — ABNORMAL HIGH
Potassium: 3.7
Total Protein: 6.8

## 2010-10-10 LAB — APTT: aPTT: 31

## 2010-11-19 ENCOUNTER — Other Ambulatory Visit: Payer: Self-pay | Admitting: *Deleted

## 2010-11-19 MED ORDER — MINOXIDIL 10 MG PO TABS: ORAL_TABLET | ORAL | Status: DC

## 2010-11-19 NOTE — Telephone Encounter (Signed)
Fax received from pharmacy. Refill completed. Jodette Kieley Akter RN  

## 2010-12-26 ENCOUNTER — Telehealth: Payer: Self-pay | Admitting: Cardiovascular Disease

## 2010-12-26 MED ORDER — DOFETILIDE 500 MCG PO CAPS: 500.0000 ug | ORAL_CAPSULE | Freq: Two times a day (BID) | ORAL | Status: DC

## 2010-12-26 NOTE — Telephone Encounter (Signed)
New Problem:   Patient called wanting a refill of dofetilide (TIKOSYN) 500 MCG capsule [facility] [address] just for this refill.

## 2010-12-26 NOTE — Telephone Encounter (Signed)
Fax Received. Refill Completed. Collin Hendley Chowoe (R.M.A)   

## 2011-01-28 ENCOUNTER — Other Ambulatory Visit: Payer: Self-pay | Admitting: *Deleted

## 2011-01-28 MED ORDER — AMLODIPINE BESYLATE 10 MG PO TABS: 10.0000 mg | ORAL_TABLET | Freq: Two times a day (BID) | ORAL | Status: DC

## 2011-01-28 NOTE — Telephone Encounter (Signed)
Fax Received. Refill Completed. [name] (R.M.A)   

## 2011-02-18 ENCOUNTER — Other Ambulatory Visit: Payer: Self-pay | Admitting: Cardiovascular Disease

## 2011-02-18 MED ORDER — POTASSIUM CHLORIDE CRYS ER 20 MEQ PO TBCR: 20.0000 meq | EXTENDED_RELEASE_TABLET | Freq: Three times a day (TID) | ORAL | Status: DC

## 2011-02-18 NOTE — Telephone Encounter (Signed)
FU Msg: Pt will be out of medication tomorrow and needs refill called in ASAP.

## 2011-02-20 ENCOUNTER — Other Ambulatory Visit: Payer: Self-pay | Admitting: Cardiovascular Disease

## 2011-02-20 MED ORDER — POTASSIUM CHLORIDE CRYS ER 20 MEQ PO TBCR: 20.0000 meq | EXTENDED_RELEASE_TABLET | Freq: Three times a day (TID) | ORAL | Status: DC

## 2011-02-20 NOTE — Telephone Encounter (Signed)
New Msg: Pt wants [facility] a quantity of 270 tablets. Please call RX in asap.

## 2011-02-20 NOTE — Telephone Encounter (Addendum)
Refilled potassium, advised patient we called for #270 tablets

## 2011-03-31 ENCOUNTER — Ambulatory Visit (INDEPENDENT_AMBULATORY_CARE_PROVIDER_SITE_OTHER): Payer: Medicare Other | Admitting: Cardiovascular Disease

## 2011-03-31 ENCOUNTER — Encounter: Payer: Self-pay | Admitting: Cardiovascular Disease

## 2011-03-31 VITALS — BP 158/85 | HR 69 | Ht 71.0 in | Wt 209.1 lb

## 2011-03-31 DIAGNOSIS — I4891 Unspecified atrial fibrillation: Secondary | ICD-10-CM

## 2011-03-31 MED ORDER — DOFETILIDE 500 MCG PO CAPS: 500.0000 ug | ORAL_CAPSULE | Freq: Two times a day (BID) | ORAL | Status: DC

## 2011-03-31 NOTE — Assessment & Plan Note (Signed)
[name] has maintained normal sinus rhythm. He's currently on Tikosyn. His QT interval is normal. We'll continue with the same medications.  He complains of having some palpitations. These episodes only last for about 15 minutes and I think that it is unlikely that this represents recurrent paroxysmal atrial fibrillation since it only lasts for 15 minutes. I would think that it would last for several hours if it was really atrial fibrillation.  I've asked him to call us and we can place an event monitor on him if he starts having more palpitations. He may just be premature ventricular contractions.  We had checked a basic metabolic profile today patient his potassium is okay and we check a magnesium level.

## 2011-03-31 NOTE — Patient Instructions (Signed)
Your physician wants you to follow-up in: 6 MONTHS You will receive a reminder letter in the mail two months in advance. If you don't receive a letter, please call our office to schedule the follow-up appointment.  Your physician recommends that you return for lab work in: [place]

## 2011-03-31 NOTE — Progress Notes (Signed)
[name] Date of Birth  08-May-1940 [facility] [address]    [place], [place]  [address] [phone]  Fax  [phone]  Problem list: 1. Hypertension 2. Atrial fibrillation-maintained on antiarrhythmic therapy 3. Diabetes mellitus 4. Arthritis 5. 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 He has a hyperdynamic LV with systolic anterior motion of the mitral valve leaflet.  History of Present Illness:  [name] is a 71 year old gentleman with a history of hypertension, atrial fibrillation and a myocardial bridge.  He is a previous patient of Dr. Susa Simmonds. He also has a history of diabetes. He has felt fairly well. He has not had any episodes of chest pain or shortness of breath.    He has had episodes of palpitations that he thinks may be due to a-Fib.  These occur spontaneously and last 15-20 minutes. No associated chest pain although he has some dyspnea.  He is a retired Software engineer.   Current Outpatient Prescriptions on File Prior to Visit  Medication Sig Dispense Refill  . acetaminophen (TYLENOL) 500 MG tablet Take 500 mg by mouth every 2 (two) hours as needed.        . ALPRAZolam (XANAX) 0.25 MG tablet Take by mouth at bedtime as needed. Taking 0.25mg  to 1mg  PRN      . amLODipine (NORVASC) 10 MG tablet Take 1 tablet (10 mg total) by mouth 2 (two) times daily.  180 tablet  3  . aspirin 325 MG tablet Take 325 mg by mouth. Taking 4 Tab BID      . atorvastatin (LIPITOR) 20 MG tablet Take 20 mg by mouth daily. 3 times weekly      . Cholecalciferol (VITAMIN D) 2000 UNITS CAPS Take 3 capsules by mouth daily.        . citalopram (CELEXA) 40 MG tablet Take 40 mg by mouth daily.        [name] CLONIDINE HCL PO Take 0.2 mg by mouth 3 (three) times daily.       . diphenhydrAMINE (BENADRYL) 25 MG tablet Take 25 mg by mouth every 6 (six) hours as needed.        . dofetilide (TIKOSYN) 500 MCG capsule Take 1 capsule (500 mcg total) by mouth 2 (two) times daily.  180 capsule  1  . furosemide  (LASIX) 20 MG tablet Take 20 mg by mouth every morning.       [name] glimepiride (AMARYL) 2 MG tablet Take 2 mg by mouth daily before breakfast.        . HYDROcodone-acetaminophen (VICODIN) 5-500 MG per tablet Take 1 tablet by mouth every 6 (six) hours as needed.        . metFORMIN (GLUCOPHAGE) 500 MG tablet Take 500 mg by mouth 2 (two) times daily with a meal. Take 2 tablets      . minoxidil (LONITEN) 10 MG tablet Take 2 tabs QHS  60 tablet  5  . Multiple Vitamin (MULTIVITAMIN) capsule Take 1 capsule by mouth daily.        . nitroGLYCERIN (NITROSTAT) 0.4 MG SL tablet Place 1 tablet (0.4 mg total) under the tongue every 5 (five) minutes as needed.  25 tablet  3  . potassium chloride SA (KLOR-CON M20) 20 MEQ tablet Take 1 tablet (20 mEq total) by mouth 3 (three) times daily.  90 tablet  5  . ranitidine (ZANTAC) 150 MG tablet Take 150 mg by mouth. 1 tab by mouth as needed.       . valsartan (DIOVAN) 320 MG  tablet Take 1 tablet (320 mg total) by mouth daily.  30 tablet  11  . Vitamins-Lipotropics (SUPER STRESS B-COMPLEX CR PO) Take by mouth.          Allergies  Allergen Reactions  . Horse-Derived Products   . Other     Tetanus Shot    Past Medical History  Diagnosis Date  . Hx of adenomatous colonic polyps   . Diabetes mellitus type II   . Hypertension   . Diverticulosis 2001  . Hyperlipidemia   . Atrial fibrillation   . Joint pain   . Depression     Past Surgical History  Procedure Date  . Polinydal cyst     Removed  . Great toe arthrodesis, interphalangeal joint     Right foot  . Retina repair-right   . Cataract extraction     bilateral  . Basal cell carcinoma excision     x3 on face  . Polypectomy   . Cardiac catheterization     History  Smoking status  . Former Smoker  . Types: Cigarettes  . Quit date: 01/05/1981  Smokeless tobacco  . Never Used  Comment: Stopped 1983    History  Alcohol Use  . Yes    1-3 drinks per week    Family History  Problem Relation  Age of Onset  . Colon cancer Mother     Family History/Uncle   . Colon polyps Mother     Family History  . Atrial fibrillation Mother   . Hypertension Mother   . Colon polyps Sister     Family history  . Diabetes Maternal Uncle   . Stroke Paternal Uncle   . Dementia Father     Reviw of Systems:  Reviewed in the HPI.  All other systems are negative.  Physical Exam: BP 158/85  Pulse 69  Ht 5\' 11"  (1.803 m)  Wt 209 lb 1.9 oz (94.856 kg)  BMI 29.17 kg/m2 The patient is alert and oriented x 3.  The mood and affect are normal.   Skin: warm and dry.  Color is normal.    HEENT:   the sclera are nonicteric.  The mucous membranes are moist.  The carotids are 2+ without bruits.  There is no thyromegaly.  There is no JVD.    Lungs: clear.  The chest wall is non tender.    Heart: regular rate with a normal S1 and S2.  He has a hyperdynamic PMI. He has a very soft but brief 2/6 systolic murmur cholesterol border. 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  Abdomen: good bowel sounds.  There is no guarding or rebound.  There is no hepatosplenomegaly or tenderness.  There are no masses.   Extremities:  no clubbing, cyanosis, or edema.  The legs are without rashes.  The distal pulses are intact.   Neuro:  Cranial nerves II - XII are intact.  Motor and sensory functions are intact.    The gait is normal.  ECG: 03/31/2011. 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 His QTC is 444 ms.  Assessment / Plan:

## 2011-04-01 LAB — BASIC METABOLIC PANEL
BUN: 19 mg/dL (ref 6–23)
CO2: 29 mEq/L (ref 19–32)
Calcium: 9.3 mg/dL (ref 8.4–10.5)
GFR: 84.89 mL/min (ref >60.00)
Glucose, Bld: 192 mg/dL — ABNORMAL HIGH (ref 70–99)

## 2011-04-10 ENCOUNTER — Encounter: Payer: Self-pay | Admitting: Cardiovascular Disease

## 2011-05-15 ENCOUNTER — Emergency Department (INDEPENDENT_AMBULATORY_CARE_PROVIDER_SITE_OTHER)
Admission: EM | Admit: 2011-05-15 | Discharge: 2011-05-15 | Disposition: A | Discharge status: Home / Self Care | Payer: Medicare Other | Source: Home / Self Care | Attending: Emergency Medicine | Admitting: Emergency Medicine

## 2011-05-15 ENCOUNTER — Encounter (HOSPITAL_COMMUNITY): Payer: Self-pay | Admitting: Emergency Medicine

## 2011-05-15 DIAGNOSIS — S61209A Unspecified open wound of unspecified finger without damage to nail, initial encounter: Secondary | ICD-10-CM

## 2011-05-15 DIAGNOSIS — S61219A Laceration without foreign body of unspecified finger without damage to nail, initial encounter: Secondary | ICD-10-CM

## 2011-05-15 MED ORDER — "XEROFORM PETROLAT PATCH 2""X2"" EX PADS": 1.0000 | MEDICATED_PAD | CUTANEOUS | Status: DC

## 2011-05-15 MED ORDER — HYDROCODONE-ACETAMINOPHEN 5-500 MG PO TABS: 1.0000 | ORAL_TABLET | Freq: Four times a day (QID) | ORAL | Status: AC | PRN

## 2011-05-15 NOTE — ED Provider Notes (Signed)
History     CSN: [id number]  Arrival date & time 05/15/11  1113   First MD Initiated Contact with Patient 05/15/11 1114      Chief Complaint  Patient presents with  . Laceration    (Consider location/radiation/quality/duration/timing/severity/associated sxs/prior treatment) HPI Comments: Patient was working this morning with a table saw when he accidentally cut the palmar aspect of his left thumb, he's been bleeding since then has been putting a little bit of pressure and a bandage to stop some blood continues to do so when he removes the bandage. Describes throbbing pain but able to move his finger well.  Patient is a 71 y.o. male presenting with skin laceration. The history is provided by the patient.  Laceration  The incident occurred 3 to 5 hours ago. The laceration is 2 cm in size. The laceration mechanism was a a metal edge. The pain is at a severity of 6/10. The pain is moderate. The pain has been constant since onset. He reports no foreign bodies present. His tetanus status is out of date (Patient has been told that he has a contraindication to the tetanus shot as he has had a reaction as a child).    Past Medical History  Diagnosis Date  . Hx of adenomatous colonic polyps   . Diabetes mellitus type II   . Hypertension   . Diverticulosis 2001  . Hyperlipidemia   . Atrial fibrillation   . Joint pain   . Depression     Past Surgical History  Procedure Date  . Polinydal cyst     Removed  . Great toe arthrodesis, interphalangeal joint     Right foot  . Retina repair-right   . Cataract extraction     bilateral  . Basal cell carcinoma excision     x3 on face  . Polypectomy   . Cardiac catheterization     Family History  Problem Relation Age of Onset  . Colon cancer Mother     Family History/Uncle   . Colon polyps Mother     Family History  . Atrial fibrillation Mother   . Hypertension Mother   . Colon polyps Sister     Family history  . Diabetes Maternal  Uncle   . Stroke Paternal Uncle   . Dementia Father     History  Substance Use Topics  . Smoking status: Former Smoker    Types: Cigarettes    Quit date: 01/05/1981  . Smokeless tobacco: Never Used   Comment: Stopped 1983  . Alcohol Use: Yes     1-3 drinks per week      Review of Systems  Skin: Positive for wound.  Neurological: Negative for weakness and numbness.    Allergies  Horse-derived products and Other  Home Medications   Current Outpatient Rx  Name Route Sig Dispense Refill  . ACETAMINOPHEN 500 MG PO TABS Oral Take 500 mg by mouth every 2 (two) hours as needed.      . ALPRAZOLAM 0.25 MG PO TABS Oral Take by mouth at bedtime as needed. Taking 0.25mg  to 1mg  PRN    . AMLODIPINE BESYLATE 10 MG PO TABS Oral Take 1 tablet (10 mg total) by mouth 2 (two) times daily. 180 tablet 3  . ASPIRIN 325 MG PO TABS Oral Take 325 mg by mouth. Taking 4 Tab BID    . ATORVASTATIN CALCIUM 20 MG PO TABS Oral Take 20 mg by mouth daily. 3 times weekly    . XEROFORM PETROLAT  PATCH 2"X2" EX PADS Apply externally Apply 1 patch topically 1 day or 1 dose. 25 each 0  . VITAMIN D 2000 UNITS PO CAPS Oral Take 3 capsules by mouth daily.      [name] CITALOPRAM HYDROBROMIDE 40 MG PO TABS Oral Take 40 mg by mouth daily.      [name] CLONIDINE HCL PO Oral Take 0.2 mg by mouth 3 (three) times daily.     [name] DIPHENHYDRAMINE HCL 25 MG PO TABS Oral Take 25 mg by mouth every 6 (six) hours as needed.      . DOFETILIDE 500 MCG PO CAPS Oral Take 1 capsule (500 mcg total) by mouth 2 (two) times daily. 180 capsule 1    Dr. Doreatha Lew has retired. Further refills will be b ...  . FUROSEMIDE 20 MG PO TABS Oral Take 20 mg by mouth every morning.     [name] GLIMEPIRIDE 2 MG PO TABS Oral Take 2 mg by mouth daily before breakfast.      . HYDROCODONE-ACETAMINOPHEN 5-500 MG PO TABS Oral Take 1 tablet by mouth every 6 (six) hours as needed.      [name] HYDROCODONE-ACETAMINOPHEN 5-500 MG PO TABS Oral Take 1-2 tablets by mouth every 6 (six) hours  as needed for pain. 15 tablet 0  . METFORMIN HCL 500 MG PO TABS Oral Take 500 mg by mouth 2 (two) times daily with a meal. Take 2 tablets    . MINOXIDIL 10 MG PO TABS  Take 2 tabs QHS 60 tablet 5  . MULTIVITAMINS PO CAPS Oral Take 1 capsule by mouth daily.      [name] NITROGLYCERIN 0.4 MG SL SUBL Sublingual Place 1 tablet (0.4 mg total) under the tongue every 5 (five) minutes as needed. 25 tablet 3  . POTASSIUM CHLORIDE CRYS ER 20 MEQ PO TBCR Oral Take 1 tablet (20 mEq total) by mouth 3 (three) times daily. 90 tablet 5  . RANITIDINE HCL 150 MG PO TABS Oral Take 150 mg by mouth. 1 tab by mouth as needed.     [name] VALSARTAN 320 MG PO TABS Oral Take 1 tablet (320 mg total) by mouth daily. 30 tablet 11  . SUPER STRESS B-COMPLEX CR PO Oral Take by mouth.        BP 124/65  Pulse 70  Temp(Src) 99.4 F (37.4 C) (Oral)  Resp 16  SpO2 97%  Physical Exam  Nursing note and vitals reviewed. Constitutional: He appears well-developed and well-nourished.  Musculoskeletal: He exhibits tenderness.       Hands: Neurological: He is alert.  Skin: Laceration noted. No rash noted.    ED Course  Procedures (including critical care time)  Labs Reviewed - No data to display No results found.   1. Laceration of finger       MDM  Left common avulsion type laceration with no epidermal edges to work with. With some capillary bleeding there was stopped with combination of pressure and silver nitrate. Patient able to extend and flex his PIP. Epidermis disruption looked viable and survival. After proper hygiene and cleaning seen with Hibiclens was wrapped with a thumb and nonadherent dressings. Patient was advised to return when him here so I can make sure the ulcer is healing properly        Rosana Hoes, MD 05/15/11 1535

## 2011-05-15 NOTE — Discharge Instructions (Signed)
This type of laceration is not a candidate for suture repair as discussed. Try to keep her hand elevated as much as possible the next 48 hours. Return Monday after 4 or Tuesday morning for recheck is on the be here. Return sooner if any further concerns or changes.

## 2011-05-15 NOTE — ED Notes (Signed)
Pt here with 3cmx  laceration to left thumb s/p injury 31min ago.pt states he cut finger on table saw.moderated bleeding controlled by bandage.denies pain at this time.beefy tissue exposed and intact

## 2011-05-18 ENCOUNTER — Emergency Department (HOSPITAL_COMMUNITY)
Admission: EM | Admit: 2011-05-18 | Discharge: 2011-05-18 | Disposition: A | Discharge status: Home / Self Care | Payer: Medicare Other | Source: Home / Self Care

## 2011-05-18 ENCOUNTER — Encounter (HOSPITAL_COMMUNITY): Payer: Self-pay | Admitting: Emergency Medicine

## 2011-05-18 DIAGNOSIS — S61019A Laceration without foreign body of unspecified thumb without damage to nail, initial encounter: Secondary | ICD-10-CM

## 2011-05-18 DIAGNOSIS — S61209A Unspecified open wound of unspecified finger without damage to nail, initial encounter: Secondary | ICD-10-CM

## 2011-05-18 NOTE — Discharge Instructions (Signed)
Thank you for coming in today. Please followup with your doctor in about a week. Continue daily dressing changes. Let us know if it starts getting red and painful and that may be a sign of infection. You may want to consider getting one of those safety table saws.

## 2011-05-18 NOTE — ED Notes (Addendum)
Left thumb injury, avulsion of skin on anterior aspect of thumb.  Patient here for recheck

## 2011-05-18 NOTE — ED Provider Notes (Signed)
[name] is a 71 y.o. male who presents to Urgent Care today for a lower pole of left thumb laceration.  Patient was seen on May 10 following a table saw accident that caused a superficial laceration of the volar aspect of his left thumb.  In the interim he has had daily dressing changes and feels well. He denies any redness fever or chills.     PMH reviewed. Significant for hypertension with multiple other medical problems ROS as above otherwise neg.  no chest pains, palpitations, fevers, chills, abdominal pain nausea or vomiting. Medications reviewed. No current facility-administered medications for this encounter.   Current Outpatient Prescriptions  Medication Sig Dispense Refill  . acetaminophen (TYLENOL) 500 MG tablet Take 500 mg by mouth every 2 (two) hours as needed.        . ALPRAZolam (XANAX) 0.25 MG tablet Take by mouth at bedtime as needed. Taking 0.25mg  to 1mg  PRN      . amLODipine (NORVASC) 10 MG tablet Take 1 tablet (10 mg total) by mouth 2 (two) times daily.  180 tablet  3  . aspirin 325 MG tablet Take 325 mg by mouth. Taking 4 Tab BID      . atorvastatin (LIPITOR) 20 MG tablet Take 20 mg by mouth daily. 3 times weekly      . Bismuth Tribromoph-Petrolatum (XEROFORM PETROLAT PATCH 2"X2") PADS Apply 1 patch topically 1 day or 1 dose.  25 each  0  . Cholecalciferol (VITAMIN D) 2000 UNITS CAPS Take 3 capsules by mouth daily.        . citalopram (CELEXA) 40 MG tablet Take 40 mg by mouth daily.        [name] CLONIDINE HCL PO Take 0.2 mg by mouth 3 (three) times daily.       . diphenhydrAMINE (BENADRYL) 25 MG tablet Take 25 mg by mouth every 6 (six) hours as needed.        . dofetilide (TIKOSYN) 500 MCG capsule Take 1 capsule (500 mcg total) by mouth 2 (two) times daily.  180 capsule  1  . furosemide (LASIX) 20 MG tablet Take 20 mg by mouth every morning.       [name] glimepiride (AMARYL) 2 MG tablet Take 2 mg by mouth daily before breakfast.        . HYDROcodone-acetaminophen (VICODIN)  5-500 MG per tablet Take 1 tablet by mouth every 6 (six) hours as needed.        [name] HYDROcodone-acetaminophen (VICODIN) 5-500 MG per tablet Take 1-2 tablets by mouth every 6 (six) hours as needed for pain.  15 tablet  0  . metFORMIN (GLUCOPHAGE) 500 MG tablet Take 500 mg by mouth 2 (two) times daily with a meal. Take 2 tablets      . minoxidil (LONITEN) 10 MG tablet Take 2 tabs QHS  60 tablet  5  . Multiple Vitamin (MULTIVITAMIN) capsule Take 1 capsule by mouth daily.        . nitroGLYCERIN (NITROSTAT) 0.4 MG SL tablet Place 1 tablet (0.4 mg total) under the tongue every 5 (five) minutes as needed.  25 tablet  3  . potassium chloride SA (KLOR-CON M20) 20 MEQ tablet Take 1 tablet (20 mEq total) by mouth 3 (three) times daily.  90 tablet  5  . ranitidine (ZANTAC) 150 MG tablet Take 150 mg by mouth. 1 tab by mouth as needed.       . valsartan (DIOVAN) 320 MG tablet Take 1 tablet (320 mg total) by  mouth daily.  30 tablet  11  . Vitamins-Lipotropics (SUPER STRESS B-COMPLEX CR PO) Take by mouth.          Exam:  BP 137/79  Pulse 61  Temp(Src) 98.4 F (36.9 C) (Oral)  Resp 16  SpO2 99% Gen: Well NAD LEFT THUMB: Well healing with 2 areas of sever nitrate application and large areas of good granulation tissue. No surrounding erythema or tenderness. Normal thumb motion No results found for this or any previous visit (from the past 24 hour(s)). No results found.  Assessment and Plan: 71 y.o. male with recheck of thumb laceration.  Appears to be healing well without any signs or symptoms of infection. Recommend daily dressing changes and followup with primary care doctor in one week. Patient expresses understanding.     Gregor Hams, MD 05/18/11 [phone]

## 2011-05-19 ENCOUNTER — Other Ambulatory Visit: Payer: Self-pay | Admitting: Cardiology

## 2011-05-19 ENCOUNTER — Other Ambulatory Visit: Payer: Self-pay | Admitting: Cardiovascular Disease

## 2011-05-19 MED ORDER — MINOXIDIL 10 MG PO TABS: ORAL_TABLET | ORAL | Status: DC

## 2011-05-19 NOTE — ED Provider Notes (Signed)
Medical screening examination/treatment/procedure(s) were performed by PGY-3 FM resident and as supervising physician I was immediately available for consultation/collaboration.   Randa Spike, MD   Randa Spike, MD 05/19/11 [phone]

## 2011-07-16 ENCOUNTER — Encounter: Payer: Self-pay | Admitting: Cardiovascular Disease

## 2011-07-22 ENCOUNTER — Other Ambulatory Visit: Payer: Self-pay | Admitting: Cardiology

## 2011-07-22 DIAGNOSIS — I4891 Unspecified atrial fibrillation: Secondary | ICD-10-CM

## 2011-07-22 MED ORDER — DOFETILIDE 500 MCG PO CAPS: 500.0000 ug | ORAL_CAPSULE | Freq: Two times a day (BID) | ORAL | Status: DC

## 2011-08-21 ENCOUNTER — Encounter (INDEPENDENT_AMBULATORY_CARE_PROVIDER_SITE_OTHER): Payer: Medicare Other | Admitting: Ophthalmology

## 2011-08-21 DIAGNOSIS — I10 Essential (primary) hypertension: Secondary | ICD-10-CM

## 2011-08-21 DIAGNOSIS — E1139 Type 2 diabetes mellitus with other diabetic ophthalmic complication: Secondary | ICD-10-CM

## 2011-08-21 DIAGNOSIS — H33309 Unspecified retinal break, unspecified eye: Secondary | ICD-10-CM

## 2011-08-21 DIAGNOSIS — H3530 Unspecified macular degeneration: Secondary | ICD-10-CM

## 2011-08-21 DIAGNOSIS — E11319 Type 2 diabetes mellitus with unspecified diabetic retinopathy without macular edema: Secondary | ICD-10-CM

## 2011-08-21 DIAGNOSIS — H43819 Vitreous degeneration, unspecified eye: Secondary | ICD-10-CM

## 2011-08-21 DIAGNOSIS — H35039 Hypertensive retinopathy, unspecified eye: Secondary | ICD-10-CM

## 2011-08-24 ENCOUNTER — Ambulatory Visit (HOSPITAL_COMMUNITY)
Admission: RE | Admit: 2011-08-24 | Discharge: 2011-08-24 | Disposition: A | Discharge status: Home / Self Care | Payer: Medicare Other | Source: Ambulatory Visit | Attending: Internal Medicine | Admitting: Internal Medicine

## 2011-08-24 ENCOUNTER — Other Ambulatory Visit (HOSPITAL_COMMUNITY): Payer: Self-pay | Admitting: Internal Medicine

## 2011-08-24 DIAGNOSIS — S90129A Contusion of unspecified lesser toe(s) without damage to nail, initial encounter: Secondary | ICD-10-CM | POA: Insufficient documentation

## 2011-08-24 DIAGNOSIS — M79673 Pain in unspecified foot: Secondary | ICD-10-CM

## 2011-08-24 DIAGNOSIS — M79609 Pain in unspecified limb: Secondary | ICD-10-CM | POA: Insufficient documentation

## 2011-10-12 ENCOUNTER — Telehealth: Payer: Self-pay | Admitting: Cardiovascular Disease

## 2011-10-12 NOTE — Telephone Encounter (Signed)
Called and spoke with wife/ pt at dentist. I will try to call later.

## 2011-10-12 NOTE — Telephone Encounter (Signed)
Pt feels like his heart may be out of rhythm, went to dentist today and bp was 160/80 unsure of pulse, pt cant come tomorrow morning for ekg nor Wednesday, set up for tue afternoon, and if lori gerhardt np is here she will read or DOD, Pt feels his [name] is not working anymore, he has felt his heart out of rhythm off and on this weekend, pt feels ok now, no SOB,  no cp.

## 2011-10-12 NOTE — Telephone Encounter (Signed)
Follow-up:    Patient called returning your call.  Please call back.

## 2011-10-12 NOTE — Telephone Encounter (Signed)
F/u  Pt would like return call at [phone]

## 2011-10-12 NOTE — Telephone Encounter (Signed)
New problem:  C/O think he's in Afib now.

## 2011-10-13 ENCOUNTER — Other Ambulatory Visit: Payer: Self-pay

## 2011-10-13 ENCOUNTER — Telehealth: Payer: Self-pay

## 2011-10-13 ENCOUNTER — Ambulatory Visit (INDEPENDENT_AMBULATORY_CARE_PROVIDER_SITE_OTHER): Payer: Medicare Other

## 2011-10-13 VITALS — BP 136/72 | HR 61 | Ht 71.0 in | Wt 213.8 lb

## 2011-10-13 DIAGNOSIS — I4891 Unspecified atrial fibrillation: Secondary | ICD-10-CM

## 2011-10-13 LAB — BASIC METABOLIC PANEL
BUN: 17 mg/dL (ref 6–23)
CO2: 27 mEq/L (ref 19–32)
Calcium: 9.2 mg/dL (ref 8.4–10.5)
Chloride: 105 mEq/L (ref 96–112)
Creatinine, Ser: 0.9 mg/dL (ref 0.4–1.5)
GFR: 86.91 mL/min (ref >60.00)
Glucose, Bld: 194 mg/dL — ABNORMAL HIGH (ref 70–99)
Potassium: 4.1 mEq/L (ref 3.5–5.1)
Sodium: 139 mEq/L (ref 135–145)

## 2011-10-13 LAB — CBC WITH DIFFERENTIAL/PLATELET
Basophils Absolute: 0.1 10*3/uL (ref 0.0–0.1)
Basophils Relative: 0.7 % (ref 0.0–3.0)
Eosinophils Absolute: 0.3 10*3/uL (ref 0.0–0.7)
Eosinophils Relative: 3.5 % (ref 0.0–5.0)
HCT: 43.8 % (ref 39.0–52.0)
Hemoglobin: 14.7 g/dL (ref 13.0–17.0)
Lymphocytes Relative: 18.4 % (ref 12.0–46.0)
Lymphs Abs: 1.4 10*3/uL (ref 0.7–4.0)
MCHC: 33.6 g/dL (ref 30.0–36.0)
MCV: 95.6 fl (ref 78.0–100.0)
Monocytes Absolute: 0.5 10*3/uL (ref 0.1–1.0)
Monocytes Relative: 6.8 % (ref 3.0–12.0)
Neutro Abs: 5.5 10*3/uL (ref 1.4–7.7)
Neutrophils Relative %: 70.6 % (ref 43.0–77.0)
Platelets: 254 10*3/uL (ref 150.0–400.0)
RBC: 4.58 Mil/uL (ref 4.22–5.81)
RDW: 13.4 % (ref 11.5–14.6)
WBC: 7.8 10*3/uL (ref 4.5–10.5)

## 2011-10-13 LAB — TSH: TSH: 1.02 u[IU]/mL (ref 0.35–5.50)

## 2011-10-13 MED ORDER — APIXABAN 5 MG PO TABS: 5.0000 mg | ORAL_TABLET | Freq: Two times a day (BID) | ORAL | Status: DC

## 2011-10-13 NOTE — Telephone Encounter (Signed)
Patient came by office today 10/13/11 for a EKG.EKG revealed atrial fib rate 61 beats/min.B/P 136/72.EKG was shown to Truitt Merle NP.She advised cbc,bmet,tsh today.Event Monitor today.Decrease aspirin to 1 daily and stop Thursday 10/15/11.Start Eliquis 5 mg twice a day.Echo to be scheduled and appointment with Dr.Nahser to be scheduled.Patient wanted this note to be sent to PCP Dr.McKeown.Note sent to Dr.McKeown.

## 2011-10-13 NOTE — Addendum Note (Signed)
Addended by: Eulis Foster on: 10/13/2011 03:27 PM   Modules accepted: Orders

## 2011-10-13 NOTE — Progress Notes (Signed)
Patient came to office for a EKG.EKG was done and shown to Truitt Merle NP.EKG revealed Atrial Fib rate 61 beats/min.Cecille Rubin advised bmet,cbc,tsh today.Event Monitor today.Schedule Echo.Decrease aspirin to 1 daily and stop on Thursday 10/15/11.Start Eliquis 5 mg twice a day.Samples given.Appointment to be scheduled with Dr.Nahser.

## 2011-10-15 ENCOUNTER — Ambulatory Visit (HOSPITAL_COMMUNITY): Payer: Medicare Other | Attending: Cardiology

## 2011-10-15 DIAGNOSIS — I379 Nonrheumatic pulmonary valve disorder, unspecified: Secondary | ICD-10-CM | POA: Insufficient documentation

## 2011-10-15 DIAGNOSIS — I369 Nonrheumatic tricuspid valve disorder, unspecified: Secondary | ICD-10-CM | POA: Insufficient documentation

## 2011-10-15 DIAGNOSIS — I4891 Unspecified atrial fibrillation: Secondary | ICD-10-CM | POA: Insufficient documentation

## 2011-10-15 DIAGNOSIS — I080 Rheumatic disorders of both mitral and aortic valves: Secondary | ICD-10-CM | POA: Insufficient documentation

## 2011-10-15 DIAGNOSIS — E119 Type 2 diabetes mellitus without complications: Secondary | ICD-10-CM | POA: Insufficient documentation

## 2011-10-15 DIAGNOSIS — I4892 Unspecified atrial flutter: Secondary | ICD-10-CM

## 2011-10-15 NOTE — Progress Notes (Signed)
Echocardiogram performed.  

## 2011-10-20 ENCOUNTER — Encounter: Payer: Self-pay | Admitting: Cardiovascular Disease

## 2011-11-02 ENCOUNTER — Other Ambulatory Visit: Payer: Self-pay | Admitting: Dermatology

## 2011-11-03 ENCOUNTER — Telehealth: Payer: Self-pay | Admitting: *Deleted

## 2011-11-03 MED ORDER — APIXABAN 5 MG PO TABS: 5.0000 mg | ORAL_TABLET | Freq: Two times a day (BID) | ORAL | Status: DC

## 2011-11-03 NOTE — Telephone Encounter (Signed)
Pharmacy called  and said they don't have eliquis need an alternative please give them a call

## 2011-11-03 NOTE — Telephone Encounter (Signed)
Called and spoke with pharmacy, they where told that the manufacturer was at a shortage but after review they did have medication and would be able to fill, problem resolved.

## 2011-11-05 ENCOUNTER — Telehealth: Payer: Self-pay | Admitting: Cardiovascular Disease

## 2011-11-05 NOTE — Telephone Encounter (Signed)
Pt needs coupon card for the last med dr Acie Fredrickson gave him, he lost the one he was given, and forgot name of the med

## 2011-11-05 NOTE — Telephone Encounter (Signed)
Discount card placed at desk, pt aware

## 2011-11-06 ENCOUNTER — Telehealth: Payer: Self-pay | Admitting: Cardiovascular Disease

## 2011-11-06 ENCOUNTER — Other Ambulatory Visit: Payer: Self-pay

## 2011-11-06 MED ORDER — APIXABAN 5 MG PO TABS: 5.0000 mg | ORAL_TABLET | Freq: Two times a day (BID) | ORAL | Status: DC

## 2011-11-06 NOTE — Telephone Encounter (Signed)
Pt needs eliquis called into [facility] [address]

## 2011-11-06 NOTE — Telephone Encounter (Addendum)
We received a fax from [facility] stating that they are out of 5 mg tablets of Eliquis but that they do have 2.5 mg tablets in stock. I contacted pt. to give new dosing directions and the pt. advised me that he does not want to use mail order pharmacy for this medication and wants [facility] [address]. Gina at [employer] advised that pt. will [facility] [place], [place]. She verbalized understanding. [facility] [address]

## 2011-11-11 NOTE — Telephone Encounter (Signed)
Will forward to [facility] for follow -up.

## 2011-11-11 NOTE — Telephone Encounter (Signed)
Pt would like to know what is the status of getting a prior auth for his eliquis. Last time he spoke with Jodette she told him she was [place] check on getting that for him.

## 2011-11-12 NOTE — Telephone Encounter (Signed)
apologized to pt, i have not received any paperwork asking for prior auth, I called pharmacy to request the number to call, [phone]./// needed to call [phone].

## 2011-11-12 NOTE — Telephone Encounter (Signed)
F/u   Patient calling regarding prior authorization regarding Eloquis Med, plz return call to pt at [phone]

## 2011-11-12 NOTE — Telephone Encounter (Signed)
Prior auth done. Pt informed.

## 2011-11-26 ENCOUNTER — Other Ambulatory Visit: Payer: Self-pay | Admitting: *Deleted

## 2011-11-26 MED ORDER — MINOXIDIL 10 MG PO TABS: ORAL_TABLET | ORAL | Status: DC

## 2011-11-26 NOTE — Telephone Encounter (Signed)
Received call from [facility] stating they have faxed numerous times to have his minoxidil refilled, refill completed.

## 2011-11-30 ENCOUNTER — Encounter: Payer: Self-pay | Admitting: *Deleted

## 2011-11-30 ENCOUNTER — Encounter: Payer: Self-pay | Admitting: Cardiovascular Disease

## 2011-11-30 ENCOUNTER — Ambulatory Visit (INDEPENDENT_AMBULATORY_CARE_PROVIDER_SITE_OTHER): Payer: Medicare Other | Admitting: Cardiovascular Disease

## 2011-11-30 VITALS — BP 138/60 | HR 68 | Ht 71.0 in | Wt 203.0 lb

## 2011-11-30 DIAGNOSIS — I4891 Unspecified atrial fibrillation: Secondary | ICD-10-CM

## 2011-11-30 LAB — CBC WITH DIFFERENTIAL/PLATELET
Basophils Relative: 0.7 % (ref 0.0–3.0)
Eosinophils Relative: 3.5 % (ref 0.0–5.0)
Lymphocytes Relative: 13.9 % (ref 12.0–46.0)
MCV: 94.9 fl (ref 78.0–100.0)
Monocytes Absolute: 0.4 10*3/uL (ref 0.1–1.0)
Monocytes Relative: 5.3 % (ref 3.0–12.0)
Neutrophils Relative %: 76.6 % (ref 43.0–77.0)
Platelets: 313 10*3/uL (ref 150.0–400.0)
RBC: 4.76 Mil/uL (ref 4.22–5.81)
WBC: 6.9 10*3/uL (ref 4.5–10.5)

## 2011-11-30 LAB — BASIC METABOLIC PANEL
BUN: 16 mg/dL (ref 6–23)
Chloride: 102 mEq/L (ref 96–112)
Glucose, Bld: 292 mg/dL — ABNORMAL HIGH (ref 70–99)
Potassium: 4 mEq/L (ref 3.5–5.1)

## 2011-11-30 NOTE — Progress Notes (Signed)
[name] Date of Birth  1940-01-30 [facility] [address]    [place], [place]  [id number] [phone]  Fax  [phone]  Problem list: 1. Hypertension 2. HOCM 3. Aortic insuffuciency 4. Atrial fibrillation-maintained on antiarrhythmic therapy 5. Diabetes mellitus 6. 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 He has a hyperdynamic LV with systolic anterior motion of the mitral valve leaflet.  History of Present Illness:  [name] is a 71 year old gentleman with a history of hypertension, atrial fibrillation and a myocardial bridge.  He is a previous patient of Dr. Susa Simmonds. He also has a history of diabetes. He has felt fairly well. He has not had any episodes of chest pain or shortness of breath.    He has had episodes of palpitations that he thinks may be due to a-Fib.  These occur spontaneously and last 15-20 minutes. No associated chest pain although he has some dyspnea.  He is a retired Software engineer.    Nov. 25, 2013: He is very symptomatic with his Atrial fib.  He gets very winded with walking.  He has been on Eliquis and tikosyn 500 BID.  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  He was diagnosed with panic attack.  Enzymes were normal.  He is ready for his cardioversion.   Current Outpatient Prescriptions on File Prior to Visit  Medication Sig Dispense Refill  . acetaminophen (TYLENOL) 500 MG tablet Take 500 mg by mouth every 2 (two) hours as needed.        . ALPRAZolam (XANAX) 0.25 MG tablet Take by mouth at bedtime as needed. Taking 0.25mg  to 1mg  PRN      . amLODipine (NORVASC) 10 MG tablet Take 1 tablet (10 mg total) by mouth 2 (two) times daily.  180 tablet  3  . apixaban (ELIQUIS) 5 MG TABS tablet Take 1 tablet (5 mg total) by mouth 2 (two) times daily.  180 tablet  1  . atorvastatin (LIPITOR) 20 MG tablet Take 20 mg by mouth daily. 3 times weekly      . Cholecalciferol (VITAMIN D) 2000 UNITS CAPS Take 3 capsules by mouth  daily.        [name] CLONIDINE HCL PO Take 0.2 mg by mouth 3 (three) times daily.       [name] DIOVAN 320 MG tablet TAKE 1 TABLET (320 MG TOTAL) BY MOUTH DAILY.  30 tablet  11  . diphenhydrAMINE (BENADRYL) 25 MG tablet Take 25 mg by mouth every 6 (six) hours as needed.        . dofetilide (TIKOSYN) 500 MCG capsule Take 1 capsule (500 mcg total) by mouth 2 (two) times daily.  180 capsule  1  . furosemide (LASIX) 20 MG tablet Take 20 mg by mouth every morning.       [name] glimepiride (AMARYL) 2 MG tablet Take 2 mg by mouth daily before breakfast.        . HYDROcodone-acetaminophen (VICODIN) 5-500 MG per tablet Take 1 tablet by mouth every 6 (six) hours as needed.        . minoxidil (LONITEN) 10 MG tablet Take 2 tabs QHS  60 tablet  5  . Multiple Vitamin (MULTIVITAMIN) capsule Take 1 capsule by mouth daily.        . nitroGLYCERIN (NITROSTAT) 0.4 MG SL tablet Place 1 tablet (0.4 mg total) under the tongue every 5 (five) minutes as needed.  25 tablet  3  . potassium chloride SA (KLOR-CON  M20) 20 MEQ tablet Take 1 tablet (20 mEq total) by mouth 3 (three) times daily.  90 tablet  5  . ranitidine (ZANTAC) 150 MG tablet Take 150 mg by mouth. 1 tab by mouth as needed.       . sertraline (ZOLOFT) 50 MG tablet Take 50 mg by mouth daily.      . Vitamins-Lipotropics (SUPER STRESS B-COMPLEX CR PO) Take by mouth.          Allergies  Allergen Reactions  . Horse-Derived Products   . Other     Tetanus Shot    Past Medical History  Diagnosis Date  . Hx of adenomatous colonic polyps   . Diabetes mellitus type II   . Hypertension   . Diverticulosis 2001  . Hyperlipidemia   . Atrial fibrillation   . Joint pain   . Depression     Past Surgical History  Procedure Date  . Polinydal cyst     Removed  . Great toe arthrodesis, interphalangeal joint     Right foot  . Retina repair-right   . Cataract extraction     bilateral  . Basal cell carcinoma excision     x3 on face  . Polypectomy   . Cardiac  catheterization     History  Smoking status  . Former Smoker  . Types: Cigarettes  . Quit date: 01/05/1981  Smokeless tobacco  . Never Used    Comment: Stopped 1983    History  Alcohol Use  . Yes    Comment: 1-3 drinks per week    Family History  Problem Relation Age of Onset  . Colon cancer Mother     Family History/Uncle   . Colon polyps Mother     Family History  . Atrial fibrillation Mother   . Hypertension Mother   . Colon polyps Sister     Family history  . Diabetes Maternal Uncle   . Stroke Paternal Uncle   . Dementia Father     Reviw of Systems:  Reviewed in the HPI.  All other systems are negative.  Physical Exam: BP 138/60  Pulse 68  Ht 5\' 11"  (1.803 m)  Wt 203 lb (92.08 kg)  BMI 28.31 kg/m2 The patient is alert and oriented x 3.  The mood and affect are normal.   Skin: warm and dry.  Color is normal.    HEENT:   the sclera are nonicteric.  The mucous membranes are moist.  The carotids are 2+ without bruits.  There is no thyromegaly.  There is no JVD.    Lungs: clear.  The chest wall is non tender.    Heart: irregularly irregular with a normal S1 and S2.  He has a hyperdynamic PMI. He has a very soft but brief 2/6 systolic murmur at the left cardiac  border. 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  Abdomen: good bowel sounds.  There is no guarding or rebound.  There is no hepatosplenomegaly or tenderness.  There are no masses.   Extremities:  no clubbing, cyanosis, or edema.  The legs are without rashes.  The distal pulses are intact.   Neuro:  Cranial nerves II - XII are intact.  Motor and sensory functions are intact.    The gait is normal.  ECG: Nov. 25, 2013 :Atrial Fib at 64.  QTC is 457 ms. Otherwise normal.  Assessment / Plan:

## 2011-11-30 NOTE — Patient Instructions (Addendum)
Cardioversion requested for tomorrow. 12/01/11, @ 8:30 with an arrival time of 7 am. /// hosp called back and changed to 1;30pm case  Your physician recommends that you return for lab work in: today cbc, bmet, no pt pt on eliquis.   Your physician recommends that you schedule a follow-up appointment in: 1 month

## 2011-11-30 NOTE — Assessment & Plan Note (Addendum)
Has been on eliquis for a month.  On tikosyn.  Ready for Cardioversion.   Will get blood today and schedule for tomorrow if possible.  Return visit in 1 month with ECG

## 2011-12-01 ENCOUNTER — Encounter: Payer: Self-pay | Admitting: Cardiovascular Disease

## 2011-12-01 ENCOUNTER — Encounter: Payer: Self-pay | Admitting: Physician Assistant

## 2011-12-01 ENCOUNTER — Ambulatory Visit (HOSPITAL_COMMUNITY)
Admission: RE | Admit: 2011-12-01 | Discharge: 2011-12-01 | Disposition: A | Discharge status: Home / Self Care | Payer: Medicare Other | Source: Ambulatory Visit | Attending: Cardiology | Admitting: Cardiology

## 2011-12-01 ENCOUNTER — Encounter (HOSPITAL_COMMUNITY)
Admission: RE | Disposition: A | Discharge status: Home / Self Care | Payer: Self-pay | Source: Ambulatory Visit | Attending: Cardiology

## 2011-12-01 DIAGNOSIS — Z538 Procedure and treatment not carried out for other reasons: Secondary | ICD-10-CM | POA: Insufficient documentation

## 2011-12-01 DIAGNOSIS — I4891 Unspecified atrial fibrillation: Secondary | ICD-10-CM | POA: Insufficient documentation

## 2011-12-01 SURGERY — CARDIOVERSION
Anesthesia: Monitor Anesthesia Care

## 2011-12-01 NOTE — Progress Notes (Signed)
Patient presented today for TEE-DCCV. I was called to assess EKG in endoscopy. Initial EKG suggestive of NSR with PACs. Slightly irregular so rhythm strip EKG repeated to confirm which showed NSR. Discussed EKGs with Dr. Aundra Dubin who confirmed rhythm and OK to go home (continuing current med regimen). Given the patient's recent symptoms (on Tikosyn but still having paroxysmal afib, occ SOB) will move up follow-up appointment from 12/19 to tomorrow 12/02/11 at 9:30am to determine next course of treatment. Plan discussed with patient who is in agreement. [name] Ladouceur PA-C

## 2011-12-01 NOTE — Progress Notes (Signed)
Opened in error  This encounter was created in error - please disregard. 

## 2011-12-02 ENCOUNTER — Ambulatory Visit (INDEPENDENT_AMBULATORY_CARE_PROVIDER_SITE_OTHER): Payer: Medicare Other | Admitting: Cardiovascular Disease

## 2011-12-02 ENCOUNTER — Encounter: Payer: Self-pay | Admitting: Cardiovascular Disease

## 2011-12-02 VITALS — BP 138/66 | HR 66 | Ht 71.0 in | Wt 204.0 lb

## 2011-12-02 DIAGNOSIS — I4891 Unspecified atrial fibrillation: Secondary | ICD-10-CM

## 2011-12-02 NOTE — Progress Notes (Signed)
[name] Date of Birth  10/19/1940 [facility] [address]    [name], [place]  [address] [phone]  Fax  [phone]  Problem list: 1. Hypertension 2. HOCM 3. Aortic insuffuciency 4. Atrial fibrillation-maintained on antiarrhythmic therapy 5. Diabetes mellitus 6. 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 He has a hyperdynamic LV with systolic anterior motion of the mitral valve leaflet.  History of Present Illness:  [name] is a 71 year old gentleman with a history of hypertension, atrial fibrillation and a myocardial bridge.  He is a previous patient of Dr. Susa Simmonds. He also has a history of diabetes. He has felt fairly well. He has not had any episodes of chest pain or shortness of breath.      Nov. 25, 2013: He is very symptomatic with his Atrial fib.  He gets very winded with walking.  He has been on Eliquis and tikosyn 500 BID.  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  He was diagnosed with panic attack.  Enzymes were normal.  He is ready for his cardioversion.  Nov. 27, 2013: Mr. [name] was seen 2 days ago and was found to have atrial fibrillation. He was scheduled for cardioversion. When he showed up for cardioversion yesterday, he was in normal sinus rhythm.  He related to me that he had some medication changes over the past several weeks. He was previously on Tikosyn and citalopram and his atrial fibrillation seemed to be well-controlled on that.  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  It was at that time that he started having increased palpitations, anxiety, and we noted him to be in atrial fibrillation. He then discontinued the Wellbutrin and was started on Zoloft.  He seems to be tolerating this combination. It was at that time that he showed up in the short stay Center and was found to have already converted  to sinus rhythm.   Current Outpatient Prescriptions on File Prior to Visit  Medication Sig Dispense Refill  . acetaminophen (TYLENOL) 500 MG tablet Take 500 mg by mouth every 2 (two) hours as needed.        . ALPRAZolam (XANAX) 0.25 MG tablet Take by mouth at bedtime as needed. Taking 0.25mg  to 1mg  PRN      . amLODipine (NORVASC) 10 MG tablet Take 1 tablet (10 mg total) by mouth 2 (two) times daily.  180 tablet  3  . apixaban (ELIQUIS) 5 MG TABS tablet Take 1 tablet (5 mg total) by mouth 2 (two) times daily.  180 tablet  1  . atorvastatin (LIPITOR) 20 MG tablet Take 20 mg by mouth daily. 3 times weekly      . Cholecalciferol (VITAMIN D) 2000 UNITS CAPS Take 3 capsules by mouth daily.        [name] CLONIDINE HCL PO Take 0.2 mg by mouth 3 (three) times daily.       [name] DIOVAN 320 MG tablet TAKE 1 TABLET (320 MG TOTAL) BY MOUTH DAILY.  30 tablet  11  . diphenhydrAMINE (BENADRYL) 25 MG tablet Take 25 mg by mouth every 6 (six) hours as needed.        . dofetilide (TIKOSYN) 500 MCG capsule Take 1 capsule (500 mcg total) by mouth 2 (two) times daily.  180 capsule  1  . furosemide (LASIX) 20 MG tablet Take 20 mg by mouth every  morning.       [name] glimepiride (AMARYL) 2 MG tablet Take 2 mg by mouth daily before breakfast.        . HYDROcodone-acetaminophen (VICODIN) 5-500 MG per tablet Take 1 tablet by mouth every 6 (six) hours as needed.        . metFORMIN (GLUCOPHAGE-XR) 500 MG 24 hr tablet Take 500 mg by mouth 2 (two) times daily.      . minoxidil (LONITEN) 10 MG tablet Take 2 tabs QHS  60 tablet  5  . Multiple Vitamin (MULTIVITAMIN) capsule Take 1 capsule by mouth daily.        . nitroGLYCERIN (NITROSTAT) 0.4 MG SL tablet Place 1 tablet (0.4 mg total) under the tongue every 5 (five) minutes as needed.  25 tablet  3  . potassium chloride SA (KLOR-CON M20) 20 MEQ tablet Take 1 tablet (20 mEq total) by mouth 3 (three) times daily.  90 tablet  5  . ranitidine (ZANTAC) 150 MG tablet Take 150 mg by mouth. 1 tab by  mouth as needed.       . sertraline (ZOLOFT) 50 MG tablet Take 50 mg by mouth daily.      . Vitamins-Lipotropics (SUPER STRESS B-COMPLEX CR PO) Take by mouth.          Allergies  Allergen Reactions  . Horse-Derived Products   . Other     Tetanus Shot    Past Medical History  Diagnosis Date  . Hx of adenomatous colonic polyps   . Diabetes mellitus type II   . Hypertension   . Diverticulosis 2001  . Hyperlipidemia   . Atrial fibrillation   . Joint pain   . Depression     Past Surgical History  Procedure Date  . Polinydal cyst     Removed  . Great toe arthrodesis, interphalangeal joint     Right foot  . Retina repair-right   . Cataract extraction     bilateral  . Basal cell carcinoma excision     x3 on face  . Polypectomy   . Cardiac catheterization     History  Smoking status  . Former Smoker  . Types: Cigarettes  . Quit date: 01/05/1981  Smokeless tobacco  . Never Used    Comment: Stopped 1983    History  Alcohol Use  . Yes    Comment: 1-3 drinks per week    Family History  Problem Relation Age of Onset  . Colon cancer Mother     Family History/Uncle   . Colon polyps Mother     Family History  . Atrial fibrillation Mother   . Hypertension Mother   . Colon polyps Sister     Family history  . Diabetes Maternal Uncle   . Stroke Paternal Uncle   . Dementia Father     Reviw of Systems:  Reviewed in the HPI.  All other systems are negative.  Physical Exam: BP 138/66  Pulse 66  Ht 5\' 11"  (1.803 m)  Wt 204 lb (92.534 kg)  BMI 28.45 kg/m2 The patient is alert and oriented x 3.  The mood and affect are normal.   Skin: warm and dry.  Color is normal.    HEENT:   the sclera are nonicteric.  The mucous membranes are moist.  The carotids are 2+ without bruits.  There is no thyromegaly.  There is no JVD.    Lungs: clear.  The chest wall is non tender.    Heart: RR with  a normal S1 and S2.  He has a hyperdynamic PMI. He has a very soft but brief  2/6 systolic murmur at the left cardiac  border. 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  Abdomen: good bowel sounds.  There is no guarding or rebound.  There is no hepatosplenomegaly or tenderness.  There are no masses.   Extremities:  no clubbing, cyanosis, or edema.  The legs are without rashes.  The distal pulses are intact.   Neuro:  Cranial nerves II - XII are intact.  Motor and sensory functions are intact.    The gait is normal.  ECG: 12/02/2011-normal sinus rhythm. He has a first degree AV block. His QT C. is 473 ms. EKG is otherwise normal.  Assessment / Plan:

## 2011-12-02 NOTE — Patient Instructions (Addendum)
Your physician wants you to follow-up in: 6 MONTHS OR SOONER IF NEEDED You will receive a reminder letter in the mail two months in advance. If you don't receive a letter, please call our office to schedule the follow-up appointment.  Your physician recommends that you continue on your current medications as directed. Please refer to the Current Medication list given to you today.

## 2011-12-02 NOTE — Assessment & Plan Note (Signed)
Mr. Suire is doing great.  I think that we have figured out the reason why he converted from sinus rhythm back into atrial fibrillation and now has gone back into normal sinus rhythm. I suspect that has to do with an interaction between Tikosyn and the Citalopram / Wellbutrin / Zoloft.  At present his QT interval is normal I think we should continue with exactly the same dose at this time. I'll see him again in 6 months for followup visit.  Think he should stay on Zoloft since it seems to be working well for him along with the [place].

## 2011-12-24 ENCOUNTER — Ambulatory Visit: Payer: Medicare Other | Admitting: Cardiovascular Disease

## 2012-01-05 ENCOUNTER — Telehealth: Payer: Self-pay | Admitting: Cardiovascular Disease

## 2012-01-05 NOTE — Telephone Encounter (Signed)
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

## 2012-01-05 NOTE — Telephone Encounter (Signed)
New PRoblem:     Patient called in because he is having issues with his apixaban (ELIQUIS) 5 MG TABS tablet.  Please call back.

## 2012-01-07 NOTE — Telephone Encounter (Signed)
Pt was called and informed that coumadin clinic will call later today, pt has 10 supply of eliquis left.

## 2012-01-07 NOTE — Telephone Encounter (Signed)
Will forward to Dr Acie Fredrickson to review.

## 2012-01-07 NOTE — Telephone Encounter (Signed)
It is OK with me if he wants to go back on coumadin.  The risk of GI bleeding is lower with Eliquis than with coumadin.

## 2012-01-07 NOTE — Telephone Encounter (Signed)
Talked with pt and instructed will call tomorrow with dose of Lovenox and Coumadin. Pt states he has 8 more days of Eliquis and would like to start coumadin and lovenox on Monday

## 2012-01-07 NOTE — Telephone Encounter (Signed)
Will forward to coumadin clinic, pt will need to be bridged.

## 2012-01-08 MED ORDER — ENOXAPARIN SODIUM 100 MG/ML ~~LOC~~ SOLN: SUBCUTANEOUS | Status: DC

## 2012-01-08 MED ORDER — WARFARIN SODIUM 5 MG PO TABS: ORAL_TABLET | ORAL | Status: DC

## 2012-01-08 NOTE — Telephone Encounter (Signed)
Talked with pt and instructed him to begin Lovenox and Warfarin 12 hours after he takes last dose of Eliquis . Pt states he will take last dose of Eliquis on Sunday night 01/10/2012. Pt instucted to take Lovenox 90 mg subq  Every 12 hours and Warfarin 5mg  each day until seen in coumadin clinic on Friday 01/15/2012. Pt given instruction regarding how to administer Lovenox and how to discard 90 mg from 100 mg  lovenox syringe and pt verbalized understanding Ht 5 feet 11 inches  Wt 92.534  Kg  SrCr 0.9  CrCl 97.06

## 2012-01-15 ENCOUNTER — Ambulatory Visit (INDEPENDENT_AMBULATORY_CARE_PROVIDER_SITE_OTHER): Payer: Medicare Other

## 2012-01-15 DIAGNOSIS — I4891 Unspecified atrial fibrillation: Secondary | ICD-10-CM

## 2012-01-15 DIAGNOSIS — Z7901 Long term (current) use of anticoagulants: Secondary | ICD-10-CM

## 2012-01-15 LAB — POCT INR: INR: 1.1

## 2012-01-15 NOTE — Patient Instructions (Signed)

## 2012-01-19 ENCOUNTER — Ambulatory Visit (INDEPENDENT_AMBULATORY_CARE_PROVIDER_SITE_OTHER): Payer: Medicare Other | Admitting: *Deleted

## 2012-01-19 DIAGNOSIS — Z7901 Long term (current) use of anticoagulants: Secondary | ICD-10-CM

## 2012-01-19 DIAGNOSIS — I4891 Unspecified atrial fibrillation: Secondary | ICD-10-CM

## 2012-01-26 ENCOUNTER — Ambulatory Visit (INDEPENDENT_AMBULATORY_CARE_PROVIDER_SITE_OTHER): Payer: Medicare Other | Admitting: *Deleted

## 2012-01-26 DIAGNOSIS — Z7901 Long term (current) use of anticoagulants: Secondary | ICD-10-CM

## 2012-01-26 DIAGNOSIS — I4891 Unspecified atrial fibrillation: Secondary | ICD-10-CM

## 2012-01-26 MED ORDER — WARFARIN SODIUM 5 MG PO TABS: ORAL_TABLET | ORAL | Status: DC

## 2012-01-27 ENCOUNTER — Other Ambulatory Visit: Payer: Self-pay | Admitting: *Deleted

## 2012-01-27 MED ORDER — AMLODIPINE BESYLATE 10 MG PO TABS: 10.0000 mg | ORAL_TABLET | Freq: Two times a day (BID) | ORAL | Status: DC

## 2012-01-27 NOTE — Telephone Encounter (Signed)
Fax Received. Refill Completed. [name] (R.M.A)   

## 2012-02-01 ENCOUNTER — Telehealth: Payer: Self-pay

## 2012-02-01 DIAGNOSIS — I4891 Unspecified atrial fibrillation: Secondary | ICD-10-CM

## 2012-02-01 MED ORDER — DOFETILIDE 500 MCG PO CAPS: 500.0000 ug | ORAL_CAPSULE | Freq: Two times a day (BID) | ORAL | Status: DC

## 2012-02-02 ENCOUNTER — Ambulatory Visit (INDEPENDENT_AMBULATORY_CARE_PROVIDER_SITE_OTHER): Payer: Medicare Other | Admitting: *Deleted

## 2012-02-02 DIAGNOSIS — I4891 Unspecified atrial fibrillation: Secondary | ICD-10-CM

## 2012-02-02 DIAGNOSIS — Z7901 Long term (current) use of anticoagulants: Secondary | ICD-10-CM

## 2012-02-04 MED ORDER — DOFETILIDE 500 MCG PO CAPS: 500.0000 ug | ORAL_CAPSULE | Freq: Two times a day (BID) | ORAL | Status: DC

## 2012-02-04 NOTE — Telephone Encounter (Signed)
Fax Received. Refill Completed. Meet Weathington Chowoe (R.M.A)   Message left on patients cell phone to inform patient that is med was sent to his pharmacy

## 2012-02-04 NOTE — Telephone Encounter (Signed)
New problem:  tikosyn  500 mg    [facility]     Need to be ship before Tuesday

## 2012-02-15 ENCOUNTER — Ambulatory Visit (INDEPENDENT_AMBULATORY_CARE_PROVIDER_SITE_OTHER): Payer: Medicare Other | Admitting: *Deleted

## 2012-02-15 DIAGNOSIS — Z7901 Long term (current) use of anticoagulants: Secondary | ICD-10-CM

## 2012-02-15 DIAGNOSIS — I4891 Unspecified atrial fibrillation: Secondary | ICD-10-CM

## 2012-02-15 LAB — POCT INR: INR: 2

## 2012-02-20 ENCOUNTER — Other Ambulatory Visit: Payer: Self-pay

## 2012-03-07 ENCOUNTER — Ambulatory Visit (INDEPENDENT_AMBULATORY_CARE_PROVIDER_SITE_OTHER): Payer: Medicare Other | Admitting: *Deleted

## 2012-03-07 DIAGNOSIS — I4891 Unspecified atrial fibrillation: Secondary | ICD-10-CM

## 2012-03-07 DIAGNOSIS — Z7901 Long term (current) use of anticoagulants: Secondary | ICD-10-CM

## 2012-03-07 LAB — POCT INR: INR: 2.1

## 2012-03-21 ENCOUNTER — Telehealth: Payer: Self-pay | Admitting: Cardiovascular Disease

## 2012-03-21 MED ORDER — POTASSIUM CHLORIDE CRYS ER 20 MEQ PO TBCR: 20.0000 meq | EXTENDED_RELEASE_TABLET | Freq: Three times a day (TID) | ORAL | Status: DC

## 2012-03-21 NOTE — Telephone Encounter (Signed)
Pt out of potassium , [facility] [address] requested last week, needs asap

## 2012-03-24 ENCOUNTER — Other Ambulatory Visit: Payer: Self-pay | Admitting: *Deleted

## 2012-03-24 MED ORDER — WARFARIN SODIUM 5 MG PO TABS: ORAL_TABLET | ORAL | Status: DC

## 2012-03-29 ENCOUNTER — Other Ambulatory Visit: Payer: Self-pay

## 2012-03-29 MED ORDER — WARFARIN SODIUM 5 MG PO TABS: ORAL_TABLET | ORAL | Status: DC

## 2012-04-04 ENCOUNTER — Ambulatory Visit (INDEPENDENT_AMBULATORY_CARE_PROVIDER_SITE_OTHER): Payer: Medicare Other | Admitting: Pharmacist

## 2012-04-04 DIAGNOSIS — I4891 Unspecified atrial fibrillation: Secondary | ICD-10-CM

## 2012-04-04 DIAGNOSIS — Z7901 Long term (current) use of anticoagulants: Secondary | ICD-10-CM

## 2012-04-04 LAB — POCT INR: INR: 1.6

## 2012-04-12 ENCOUNTER — Encounter: Payer: Self-pay | Admitting: Cardiology

## 2012-05-02 ENCOUNTER — Ambulatory Visit (INDEPENDENT_AMBULATORY_CARE_PROVIDER_SITE_OTHER): Payer: Medicare Other | Admitting: Pharmacist

## 2012-05-02 DIAGNOSIS — Z7901 Long term (current) use of anticoagulants: Secondary | ICD-10-CM

## 2012-05-02 DIAGNOSIS — I4891 Unspecified atrial fibrillation: Secondary | ICD-10-CM

## 2012-05-02 MED ORDER — WARFARIN SODIUM 5 MG PO TABS: ORAL_TABLET | ORAL | Status: DC

## 2012-05-07 ENCOUNTER — Encounter: Payer: Self-pay | Admitting: Cardiovascular Disease

## 2012-05-09 ENCOUNTER — Telehealth: Payer: Self-pay | Admitting: *Deleted

## 2012-05-09 NOTE — Telephone Encounter (Signed)
 -----   Message from [name] to Thayer Headings, MD sent at 05/07/2012 11:30 AM -----    I have a coumadin appointment at 3:15 PM on May the 20, and an appointment with Dr. Acie Fredrickson at 3:15 PM on May 27. Please reschedule the coumadin appointment to coincide with the appointment on May 27.   Please call me at [phone] to confirm.       Daiva Nakayama

## 2012-05-09 NOTE — Telephone Encounter (Signed)
Can you please change his schedule, thank you.

## 2012-05-09 NOTE — Telephone Encounter (Signed)
Appt changed.  Left pt message and sent MyChart message

## 2012-05-17 ENCOUNTER — Other Ambulatory Visit: Payer: Self-pay | Admitting: *Deleted

## 2012-05-17 MED ORDER — VALSARTAN 320 MG PO TABS: 320.0000 mg | ORAL_TABLET | Freq: Every day | ORAL | Status: DC

## 2012-05-17 NOTE — Telephone Encounter (Signed)
Fax Received. Refill Completed. Relda Agosto Chowoe (R.M.A)   

## 2012-05-25 ENCOUNTER — Other Ambulatory Visit: Payer: Self-pay | Admitting: *Deleted

## 2012-05-25 MED ORDER — MINOXIDIL 10 MG PO TABS: ORAL_TABLET | ORAL | Status: DC

## 2012-05-25 NOTE — Progress Notes (Signed)
Fax received from pharmacy. Refill completed. Jodette Dougles Kimmey RN  

## 2012-05-31 ENCOUNTER — Ambulatory Visit (INDEPENDENT_AMBULATORY_CARE_PROVIDER_SITE_OTHER): Payer: Medicare Other | Admitting: Cardiovascular Disease

## 2012-05-31 ENCOUNTER — Encounter: Payer: Self-pay | Admitting: Cardiovascular Disease

## 2012-05-31 ENCOUNTER — Ambulatory Visit (INDEPENDENT_AMBULATORY_CARE_PROVIDER_SITE_OTHER): Payer: Medicare Other | Admitting: *Deleted

## 2012-05-31 VITALS — BP 126/78 | HR 71 | Ht 71.0 in | Wt 202.0 lb

## 2012-05-31 DIAGNOSIS — Z7901 Long term (current) use of anticoagulants: Secondary | ICD-10-CM

## 2012-05-31 DIAGNOSIS — I4891 Unspecified atrial fibrillation: Secondary | ICD-10-CM

## 2012-05-31 LAB — POCT INR: INR: 2.1

## 2012-05-31 MED ORDER — AMLODIPINE BESYLATE 10 MG PO TABS: 10.0000 mg | ORAL_TABLET | Freq: Every day | ORAL | Status: DC

## 2012-05-31 NOTE — Progress Notes (Signed)
[name] Date of Birth  1941/01/06  [facility] [address]    [place], [place]  [address] [phone]  Fax  [phone]  Problem list: 1. Hypertension 2. HOCM 3. Aortic insuffuciency 4. Atrial fibrillation-maintained on antiarrhythmic therapy 5. Diabetes mellitus 6. 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 He has a hyperdynamic LV with systolic anterior motion of the mitral valve leaflet.  History of Present Illness:  [name] is a 72 year old gentleman with a history of hypertension, atrial fibrillation and a myocardial bridge.  He is a previous patient of Dr. Susa Simmonds. He also has a history of diabetes. He has felt fairly well. He has not had any episodes of chest pain or shortness of breath.      Nov. 25, 2013: He is very symptomatic with his Atrial fib.  He gets very winded with walking.  He has been on Eliquis and tikosyn 500 BID.  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  He was diagnosed with panic attack.  Enzymes were normal.  He is ready for his cardioversion.  Nov. 27, 2013: [name] was seen 2 days ago and was found to have atrial fibrillation. He was scheduled for cardioversion. When he showed up for cardioversion yesterday, he was in normal sinus rhythm.  He related to me that he had some medication changes over the past several weeks. He was previously on Tikosyn and citalopram and his atrial fibrillation seemed to be well-controlled on that.  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  It was at that time that he started having increased palpitations, anxiety, and we noted him to be in atrial fibrillation. He then discontinued the Wellbutrin and was started on Zoloft.  He seems to be tolerating this combination. It was at that time that he showed up in the short stay Center and was found to have already converted  to sinus rhythm.  May 31, 2012:  [name] is doing well.  Still in NSR.  No leg edema.  He sticks to a low sodium diet.   He has occasional CP - typically associated with arm / chest movement.   He wants to stop coumadin - interferes with his arthritis medications.  He has maintained NSR since his last visit.    Current Outpatient Prescriptions on File Prior to Visit  Medication Sig Dispense Refill  . acetaminophen (TYLENOL) 500 MG tablet Take 500 mg by mouth every 2 (two) hours as needed.        . ALPRAZolam (XANAX) 0.25 MG tablet Take by mouth at bedtime as needed. Taking 0.25mg  to 1mg  PRN      . amLODipine (NORVASC) 10 MG tablet Take 1 tablet (10 mg total) by mouth 2 (two) times daily.  180 tablet  3  . atorvastatin (LIPITOR) 20 MG tablet Take 20 mg by mouth daily. 3 times weekly      . Cholecalciferol (VITAMIN D) 2000 UNITS CAPS Take 3 capsules by mouth daily.        [name] CLONIDINE HCL PO Take 0.2 mg by mouth 3 (three) times daily.       . diphenhydrAMINE (BENADRYL) 25 MG tablet Take 25 mg by mouth every 6 (six) hours as needed.        . dofetilide (TIKOSYN) 500 MCG capsule Take 1 capsule (500 mcg total) by mouth 2 (two) times daily.  180 capsule  3  . furosemide (LASIX) 20 MG tablet Take 20 mg by mouth. Taking 3 Times a week      . glimepiride (AMARYL) 2 MG tablet Take 2 mg by mouth daily before breakfast.        . HYDROcodone-acetaminophen (VICODIN) 5-500 MG per tablet Take 1 tablet by mouth every 6 (six) hours as needed.        . metFORMIN (GLUCOPHAGE-XR) 500 MG 24 hr tablet Take 500 mg by mouth 2 (two) times daily.      . minoxidil (LONITEN) 10 MG tablet Take 2 tabs QHS  60 tablet  11  . nitroGLYCERIN (NITROSTAT) 0.4 MG SL tablet Place 1 tablet (0.4 mg total) under the tongue every 5 (five) minutes as needed.  25 tablet  3  . potassium chloride SA (KLOR-CON M20) 20 MEQ tablet Take 1 tablet (20 mEq total) by mouth 3 (three) times daily.  90 tablet  5  . ranitidine (ZANTAC) 150 MG tablet  Take 150 mg by mouth. 1 tab by mouth as needed.       . sertraline (ZOLOFT) 50 MG tablet Take 50 mg by mouth daily.      . valsartan (DIOVAN) 320 MG tablet Take 1 tablet (320 mg total) by mouth daily.  30 tablet  5  . Vitamins-Lipotropics (SUPER STRESS B-COMPLEX CR PO) Take by mouth.        . warfarin (COUMADIN) 5 MG tablet Take as directed by coumadin clinic  60 tablet  2   No current facility-administered medications on file prior to visit.    Allergies  Allergen Reactions  . Horse-Derived Products   . Other     Tetanus Shot    Past Medical History  Diagnosis Date  . Hx of adenomatous colonic polyps   . Diabetes mellitus type II   . Hypertension   . Diverticulosis 2001  . Hyperlipidemia   . Atrial fibrillation   . Joint pain   . Depression     Past Surgical History  Procedure Laterality Date  . Polinydal cyst      Removed  . Great toe arthrodesis, interphalangeal joint      Right foot  . Retina repair-right    . Cataract extraction      bilateral  . Basal cell carcinoma excision      x3 on face  . Polypectomy    . Cardiac catheterization      History  Smoking status  . Former Smoker  . Types: Cigarettes  . Quit date: 01/05/1981  Smokeless tobacco  . Never Used    Comment: Stopped 1983    History  Alcohol Use  . Yes    Comment: 1-3 drinks per week    Family History  Problem Relation Age of Onset  . Colon cancer Mother     Family History/Uncle   . Colon polyps Mother     Family History  . Atrial fibrillation Mother   . Hypertension Mother   . Colon polyps Sister     Family history  . Diabetes Maternal Uncle   . Stroke Paternal Uncle   . Dementia Father     Reviw of Systems:  Reviewed in the HPI.  All other systems are negative.  Physical Exam: BP 126/78  Pulse 71  Ht 5\' 11"  (1.803 m)  Wt 202 lb (91.627 kg)  BMI 28.19 kg/m2  SpO2 97% The patient is alert and oriented x 3.  The mood and affect are  normal.   Skin: warm and dry.  Color  is normal.    HEENT:   the sclera are nonicteric.  The mucous membranes are moist.  The carotids are 2+ without bruits.  There is no thyromegaly.  There is no JVD.    Lungs: clear.  The chest wall is non tender.    Heart: RR with a normal S1 and S2.  He has a hyperdynamic PMI. He has a very soft but brief 2/6 systolic murmur at the left cardiac  border. 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  Abdomen: good bowel sounds.  There is no guarding or rebound.  There is no hepatosplenomegaly or tenderness.  There are no masses.   Extremities:  no clubbing, cyanosis, or edema.  The legs are without rashes.  The distal pulses are intact.   Neuro:  Cranial nerves II - XII are intact.  Motor and sensory functions are intact.    The gait is normal.  ECG: May  27th 2014: Normal sinus rhythm at 70 beats a minute. He has first-degree AV block.  Assessment / Plan:

## 2012-05-31 NOTE — Patient Instructions (Addendum)
Your physician has recommended you make the following change in your medication:   Stop coumadin Decrease amlodipine to 10 mg daily  Your physician wants you to follow-up in: 6 months with ekg You will receive a reminder letter in the mail two months in advance. If you don't receive a letter, please call our office to schedule the follow-up appointment.

## 2012-05-31 NOTE — Assessment & Plan Note (Addendum)
He seems to be doing well. He has maintained sinus rhythm.  He would like to stop the Coumadin. He has tried Eliquis in the past but had lots of bleeding from superficial skin wounds.  I suggested that he could try Xarelto but he was hesitant.    Since he is maintaining sinus rhythm on the current dose of Tikosyn he would like to discontinue the Coumadin until he does. The Coumadin greatly interferes with his ability to take effective arthritis medications and he states that his quality of life is not very good at this point. He understands that he is still at some increased risk for stroke.  I will see him again in 6 months- sooner if needed.  I'll see him  again in 6 months. We'll check an EKG at that time.

## 2012-07-12 ENCOUNTER — Other Ambulatory Visit: Payer: Self-pay | Admitting: Cardiovascular Disease

## 2012-07-12 NOTE — Telephone Encounter (Signed)
Fax Received. Refill Completed. [name] (R.M.A)   

## 2012-08-02 ENCOUNTER — Other Ambulatory Visit: Payer: Self-pay | Admitting: Dermatology

## 2012-08-10 ENCOUNTER — Other Ambulatory Visit: Payer: Self-pay

## 2012-08-22 ENCOUNTER — Ambulatory Visit (INDEPENDENT_AMBULATORY_CARE_PROVIDER_SITE_OTHER): Payer: Medicare Other | Admitting: Ophthalmology

## 2012-08-30 ENCOUNTER — Ambulatory Visit (INDEPENDENT_AMBULATORY_CARE_PROVIDER_SITE_OTHER): Payer: Self-pay | Admitting: Ophthalmology

## 2012-09-01 ENCOUNTER — Ambulatory Visit (INDEPENDENT_AMBULATORY_CARE_PROVIDER_SITE_OTHER): Payer: Medicare Other | Admitting: Ophthalmology

## 2012-09-01 DIAGNOSIS — H33009 Unspecified retinal detachment with retinal break, unspecified eye: Secondary | ICD-10-CM

## 2012-09-01 DIAGNOSIS — I10 Essential (primary) hypertension: Secondary | ICD-10-CM

## 2012-09-01 DIAGNOSIS — E1139 Type 2 diabetes mellitus with other diabetic ophthalmic complication: Secondary | ICD-10-CM

## 2012-09-01 DIAGNOSIS — E11319 Type 2 diabetes mellitus with unspecified diabetic retinopathy without macular edema: Secondary | ICD-10-CM

## 2012-09-01 DIAGNOSIS — H35039 Hypertensive retinopathy, unspecified eye: Secondary | ICD-10-CM

## 2012-09-01 DIAGNOSIS — H43819 Vitreous degeneration, unspecified eye: Secondary | ICD-10-CM

## 2012-09-01 DIAGNOSIS — H3530 Unspecified macular degeneration: Secondary | ICD-10-CM

## 2012-10-25 ENCOUNTER — Encounter: Payer: Self-pay | Admitting: Cardiovascular Disease

## 2012-11-10 ENCOUNTER — Encounter (INDEPENDENT_AMBULATORY_CARE_PROVIDER_SITE_OTHER): Payer: Medicare Other | Admitting: Ophthalmology

## 2012-11-10 ENCOUNTER — Other Ambulatory Visit: Payer: Self-pay

## 2012-11-10 DIAGNOSIS — E11319 Type 2 diabetes mellitus with unspecified diabetic retinopathy without macular edema: Secondary | ICD-10-CM

## 2012-11-10 DIAGNOSIS — E1139 Type 2 diabetes mellitus with other diabetic ophthalmic complication: Secondary | ICD-10-CM

## 2012-11-10 DIAGNOSIS — I10 Essential (primary) hypertension: Secondary | ICD-10-CM

## 2012-11-10 DIAGNOSIS — H348392 Tributary (branch) retinal vein occlusion, unspecified eye, stable: Secondary | ICD-10-CM

## 2012-11-10 DIAGNOSIS — H43819 Vitreous degeneration, unspecified eye: Secondary | ICD-10-CM

## 2012-11-10 DIAGNOSIS — H35039 Hypertensive retinopathy, unspecified eye: Secondary | ICD-10-CM

## 2012-11-10 DIAGNOSIS — H33009 Unspecified retinal detachment with retinal break, unspecified eye: Secondary | ICD-10-CM

## 2012-11-21 ENCOUNTER — Other Ambulatory Visit: Payer: Self-pay | Admitting: Cardiovascular Disease

## 2012-11-28 ENCOUNTER — Ambulatory Visit (INDEPENDENT_AMBULATORY_CARE_PROVIDER_SITE_OTHER): Payer: Medicare Other | Admitting: Cardiovascular Disease

## 2012-11-28 ENCOUNTER — Encounter: Payer: Self-pay | Admitting: Cardiovascular Disease

## 2012-11-28 VITALS — BP 149/83 | HR 58 | Ht 71.0 in

## 2012-11-28 DIAGNOSIS — I4891 Unspecified atrial fibrillation: Secondary | ICD-10-CM

## 2012-11-28 DIAGNOSIS — I10 Essential (primary) hypertension: Secondary | ICD-10-CM

## 2012-11-28 DIAGNOSIS — E785 Hyperlipidemia, unspecified: Secondary | ICD-10-CM

## 2012-11-28 NOTE — Progress Notes (Signed)
[name] Date of Birth  09-18-1940 [facility] [address]    [place], [place]  [address] [phone]  Fax  [phone]  Problem list: 1. Hypertension 2. HOCM 3. Aortic insuffuciency 4. Atrial fibrillation-maintained on antiarrhythmic therapy 5. Diabetes mellitus 6. 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 He has a hyperdynamic LV with systolic anterior motion of the mitral valve leaflet.  History of Present Illness:  [name] is a 71 year old gentleman with a history of hypertension, atrial fibrillation and a myocardial bridge.  He is a previous patient of Dr. Susa Simmonds. He also has a history of diabetes. He has felt fairly well. He has not had any episodes of chest pain or shortness of breath.      Nov. 25, 2013: He is very symptomatic with his Atrial fib.  He gets very winded with walking.  He has been on Eliquis and tikosyn 500 BID.  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  He was diagnosed with panic attack.  Enzymes were normal.  He is ready for his cardioversion.  Nov. 27, 2013: [name] was seen 2 days ago and was found to have atrial fibrillation. He was scheduled for cardioversion. When he showed up for cardioversion yesterday, he was in normal sinus rhythm.  He related to me that he had some medication changes over the past several weeks. He was previously on Tikosyn and citalopram and his atrial fibrillation seemed to be well-controlled on that.  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  It was at that time that he started having increased palpitations, anxiety, and we noted him to be in atrial fibrillation. He then discontinued the Wellbutrin and was started on Zoloft.  He seems to be tolerating this combination. It was at that time that he showed up in the short stay Center and was found to have already converted  to sinus rhythm.  May 31, 2012:  [name] is doing well.  Still in NSR.  No leg edema.  He sticks to a low sodium diet.   He has occasional CP - typically associated with arm / chest movement.   He wants to stop coumadin - interferes with his arthritis medications.  He has maintained NSR since his last visit.    Nov. 24, 2014:  [name] is doing OK.  He has some brief palps - he thinks they are PVCs.  No sustained episodes of Afib.   He has been getting Avastin injections into his eyes.    Current Outpatient Prescriptions on File Prior to Visit  Medication Sig Dispense Refill  . acetaminophen (TYLENOL) 500 MG tablet Take 1,000 mg by mouth every 6 (six) hours as needed.       . ALPRAZolam (XANAX) 0.25 MG tablet Take by mouth at bedtime as needed. Taking 0.25mg  to 1mg  PRN      . atorvastatin (LIPITOR) 20 MG tablet Take 20 mg by mouth daily. 3 times weekly      . Cholecalciferol (VITAMIN D) 2000 UNITS CAPS Take 3 capsules by mouth daily.        [name] CLONIDINE HCL PO Take 0.2 mg by mouth 3 (three) times daily.       [name] DIOVAN 320 MG tablet TAKE 1 TABLET (320 MG TOTAL) BY MOUTH DAILY.  30 tablet  0  . diphenhydrAMINE (BENADRYL) 25 MG tablet Take 25  mg by mouth every 6 (six) hours as needed.        . dofetilide (TIKOSYN) 500 MCG capsule Take 1 capsule (500 mcg total) by mouth 2 (two) times daily.  180 capsule  3  . furosemide (LASIX) 20 MG tablet Take 20 mg by mouth. Taking 3 Times a week      . glimepiride (AMARYL) 2 MG tablet Take 2 mg by mouth daily before breakfast.        . HYDROcodone-acetaminophen (VICODIN) 5-500 MG per tablet Take 1 tablet by mouth every 6 (six) hours as needed.        . metFORMIN (GLUCOPHAGE-XR) 500 MG 24 hr tablet Take 500 mg by mouth 2 (two) times daily.      . minoxidil (LONITEN) 10 MG tablet Take 2 tabs QHS  60 tablet  11  . nitroGLYCERIN (NITROSTAT) 0.4 MG SL tablet Place 1 tablet (0.4 mg total) under the tongue every 5 (five) minutes as needed.  25 tablet  3  . potassium  chloride SA (K-DUR,KLOR-CON) 20 MEQ tablet TAKE 1 TABLET (20 MEQ TOTAL) BY MOUTH 3 (THREE) TIMES DAILY.  270 tablet  4  . ranitidine (ZANTAC) 150 MG tablet Take 150 mg by mouth. 1 tab by mouth as needed.       . sertraline (ZOLOFT) 50 MG tablet Take 50 mg by mouth daily.      . Vitamins-Lipotropics (SUPER STRESS B-COMPLEX CR PO) Take by mouth.         No current facility-administered medications on file prior to visit.    Allergies  Allergen Reactions  . Horse-Derived Products   . Other     Tetanus Shot    Past Medical History  Diagnosis Date  . Hx of adenomatous colonic polyps   . Diabetes mellitus type II   . Hypertension   . Diverticulosis 2001  . Hyperlipidemia   . Atrial fibrillation   . Joint pain   . Depression     Past Surgical History  Procedure Laterality Date  . Polinydal cyst      Removed  . Great toe arthrodesis, interphalangeal joint      Right foot  . Retina repair-right    . Cataract extraction      bilateral  . Basal cell carcinoma excision      x3 on face  . Polypectomy    . Cardiac catheterization      History  Smoking status  . Former Smoker  . Types: Cigarettes  . Quit date: 01/05/1981  Smokeless tobacco  . Never Used    Comment: Stopped 1983    History  Alcohol Use  . Yes    Comment: 1-3 drinks per week    Family History  Problem Relation Age of Onset  . Colon cancer Mother     Family History/Uncle   . Colon polyps Mother     Family History  . Atrial fibrillation Mother   . Hypertension Mother   . Colon polyps Sister     Family history  . Diabetes Maternal Uncle   . Stroke Paternal Uncle   . Dementia Father     Reviw of Systems:  Reviewed in the HPI.  All other systems are negative.  Physical Exam: BP 149/83  Pulse 58  Ht 5\' 11"  (1.803 m) The patient is alert and oriented x 3.  The mood and affect are normal.   Skin: warm and dry.  Color is normal.    HEENT:   the  sclera are nonicteric.  The mucous membranes are  moist.  The carotids are 2+ without bruits.  There is no thyromegaly.  There is no JVD.    Lungs: clear.  The chest wall is non tender.    Heart: RR with a normal S1 and S2.  He has a hyperdynamic PMI. He has a very soft but brief 2/6 systolic murmur at the left cardiac  border. 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  Abdomen: good bowel sounds.  There is no guarding or rebound.  There is no hepatosplenomegaly or tenderness.  There are no masses.   Extremities:  no clubbing, cyanosis, or edema.  The legs are without rashes.  The distal pulses are intact.   Neuro:  Cranial nerves II - XII are intact.  Motor and sensory functions are intact.    The gait is normal.  ECG: May  27th 2014: Normal sinus rhythm at 70 beats a minute. He has first-degree AV block.  Assessment / Plan:

## 2012-11-28 NOTE — Assessment & Plan Note (Signed)
He is likely in AF today.  It appears that the tikosyn is helping him stay in NSR.  He is basically asymptomatic .  If we find that he is still in AF in 6 months, we can consider stopping the Tikosyn and concentrate on rate control and anticoagulation

## 2012-11-28 NOTE — Patient Instructions (Signed)
Your physician wants you to follow-up in: 6 months You will receive a reminder letter in the mail two months in advance. If you don't receive a letter, please call our office to schedule the follow-up appointment.  Your physician recommends that you return for a FASTING lipid profile: 6 months   Your physician recommends that you continue on your current medications as directed. Please refer to the Current Medication list given to you today.

## 2012-12-08 ENCOUNTER — Encounter (INDEPENDENT_AMBULATORY_CARE_PROVIDER_SITE_OTHER): Payer: Medicare Other | Admitting: Ophthalmology

## 2012-12-12 ENCOUNTER — Encounter (INDEPENDENT_AMBULATORY_CARE_PROVIDER_SITE_OTHER): Payer: Medicare Other | Admitting: Ophthalmology

## 2012-12-12 DIAGNOSIS — I10 Essential (primary) hypertension: Secondary | ICD-10-CM

## 2012-12-12 DIAGNOSIS — H35039 Hypertensive retinopathy, unspecified eye: Secondary | ICD-10-CM

## 2012-12-12 DIAGNOSIS — H348392 Tributary (branch) retinal vein occlusion, unspecified eye, stable: Secondary | ICD-10-CM

## 2012-12-12 DIAGNOSIS — H43819 Vitreous degeneration, unspecified eye: Secondary | ICD-10-CM

## 2012-12-12 DIAGNOSIS — E11319 Type 2 diabetes mellitus with unspecified diabetic retinopathy without macular edema: Secondary | ICD-10-CM

## 2012-12-19 ENCOUNTER — Other Ambulatory Visit: Payer: Self-pay | Admitting: Cardiovascular Disease

## 2013-01-09 ENCOUNTER — Other Ambulatory Visit: Payer: Self-pay | Admitting: Cardiovascular Disease

## 2013-01-09 ENCOUNTER — Encounter (INDEPENDENT_AMBULATORY_CARE_PROVIDER_SITE_OTHER): Payer: Medicare Other | Admitting: Ophthalmology

## 2013-01-09 DIAGNOSIS — H43819 Vitreous degeneration, unspecified eye: Secondary | ICD-10-CM

## 2013-01-09 DIAGNOSIS — I10 Essential (primary) hypertension: Secondary | ICD-10-CM

## 2013-01-09 DIAGNOSIS — E1165 Type 2 diabetes mellitus with hyperglycemia: Secondary | ICD-10-CM

## 2013-01-09 DIAGNOSIS — E11319 Type 2 diabetes mellitus with unspecified diabetic retinopathy without macular edema: Secondary | ICD-10-CM

## 2013-01-09 DIAGNOSIS — E1139 Type 2 diabetes mellitus with other diabetic ophthalmic complication: Secondary | ICD-10-CM

## 2013-01-09 DIAGNOSIS — H35039 Hypertensive retinopathy, unspecified eye: Secondary | ICD-10-CM

## 2013-01-09 DIAGNOSIS — H33009 Unspecified retinal detachment with retinal break, unspecified eye: Secondary | ICD-10-CM

## 2013-01-09 DIAGNOSIS — H348392 Tributary (branch) retinal vein occlusion, unspecified eye, stable: Secondary | ICD-10-CM

## 2013-01-16 ENCOUNTER — Encounter: Payer: Self-pay | Admitting: Cardiovascular Disease

## 2013-01-16 ENCOUNTER — Ambulatory Visit (INDEPENDENT_AMBULATORY_CARE_PROVIDER_SITE_OTHER): Payer: Medicare Other | Admitting: Cardiovascular Disease

## 2013-01-16 VITALS — BP 148/68 | HR 69 | Ht 70.5 in | Wt 202.0 lb

## 2013-01-16 DIAGNOSIS — I509 Heart failure, unspecified: Secondary | ICD-10-CM

## 2013-01-16 DIAGNOSIS — I517 Cardiomegaly: Secondary | ICD-10-CM

## 2013-01-16 DIAGNOSIS — I4891 Unspecified atrial fibrillation: Secondary | ICD-10-CM

## 2013-01-16 DIAGNOSIS — I5032 Chronic diastolic (congestive) heart failure: Secondary | ICD-10-CM

## 2013-01-16 NOTE — Patient Instructions (Addendum)
Your physician has requested that you have an echocardiogram. Echocardiography is a painless test that uses sound waves to create images of your heart. It provides your doctor with information about the size and shape of your heart and how well your heart's chambers and valves are working. This procedure takes approximately one hour. There are no restrictions for this procedure.  Your physician has recommended you make the following change in your medication: STOP [place] physician recommends that you schedule a follow-up appointment in:  [place] OFFICE/ 01/30/13 FOR OV AND EKG

## 2013-01-16 NOTE — Progress Notes (Signed)
[name] Date of Birth  Oct 07, 1940 [facility] [address]    [place], [place]  [address] [phone]  Fax  [phone]  Problem list: 1. Hypertension 2. HOCM 3. Aortic insuffuciency 4. Atrial fibrillation-maintained on antiarrhythmic therapy 5. Diabetes mellitus 6. 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 He has a hyperdynamic LV with systolic anterior motion of the mitral valve leaflet.  History of Present Illness:  [name] is a 73 year old gentleman with a history of hypertension, atrial fibrillation and a myocardial bridge.  He is a previous patient of Dr. Susa Simmonds. He also has a history of diabetes. He has felt fairly well. He has not had any episodes of chest pain or shortness of breath.      Nov. 25, 2013: He is very symptomatic with his Atrial fib.  He gets very winded with walking.  He has been on Eliquis and tikosyn 500 BID.  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  He was diagnosed with panic attack.  Enzymes were normal.  He is ready for his cardioversion.  Nov. 27, 2013: [name] was seen 2 days ago and was found to have atrial fibrillation. He was scheduled for cardioversion. When he showed up for cardioversion yesterday, he was in normal sinus rhythm.  He related to me that he had some medication changes over the past several weeks. He was previously on Tikosyn and citalopram and his atrial fibrillation seemed to be well-controlled on that.  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  It was at that time that he started having increased palpitations, anxiety, and we noted him to be in atrial fibrillation. He then discontinued the Wellbutrin and was started on Zoloft.  He seems to be tolerating this combination. It was at that time that he showed up in the short stay Center and was found to have already converted  to sinus rhythm.  May 31, 2012:  [name] is doing well.  Still in NSR.  No leg edema.  He sticks to a low sodium diet.   He has occasional CP - typically associated with arm / chest movement.   He wants to stop coumadin - interferes with his arthritis medications.  He has maintained NSR since his last visit.    Nov. 24, 2014:  [name] is doing OK.  He has some brief palps - he thinks they are PVCs.  No sustained episodes of Afib.   He has been getting Avastin injections into his eyes.    Jan. 12, 2015:  [name] is seen today as a work in visit for worsening ankle edema.   He has not been able to associate his symptoms with change of diet.  .  he has also been noticing some increasing shortness of breath.  He notes short or shortness of breath with any activity-example take the garbage cans out.   He denies any PND. He uses a CPAP mask.     Current Outpatient Prescriptions on File Prior to Visit  Medication Sig Dispense Refill  . acetaminophen (TYLENOL) 500 MG tablet Take 1,000 mg by mouth every 6 (six) hours as needed.       . ALPRAZolam (XANAX) 0.25 MG tablet Take by mouth at bedtime as needed. Taking 0.25mg  to 1mg  PRN      . amLODipine (NORVASC) 10 MG tablet Take 10 mg by mouth  2 (two) times daily.      [name] aspirin 325 MG EC tablet Take 8 tablets daily. Pt sometimes takes 10-12 tablets a day (01/16/13)      . atorvastatin (LIPITOR) 20 MG tablet Take 20 mg by mouth daily. 3 times weekly      . BESIVANCE 0.6 % SUSP Use in left eye 4 times a day after each injection      . Cholecalciferol (VITAMIN D) 2000 UNITS CAPS Take 3 capsules by mouth daily.        [name] CLONIDINE HCL PO Take 0.2 mg by mouth 3 (three) times daily.       . diphenhydrAMINE (BENADRYL) 25 MG tablet Take 25 mg by mouth every 6 (six) hours as needed.        . furosemide (LASIX) 20 MG tablet Take 20 mg by mouth. Taking 3 Times a week      . glimepiride (AMARYL) 2 MG tablet Take 2 mg by mouth daily before breakfast.        .  HYDROcodone-acetaminophen (VICODIN) 5-500 MG per tablet Take 1 tablet by mouth every 6 (six) hours as needed.        . metFORMIN (GLUCOPHAGE-XR) 500 MG 24 hr tablet Take 500 mg by mouth 2 (two) times daily.      . minoxidil (LONITEN) 10 MG tablet Take 2 tabs QHS  60 tablet  11  . nitroGLYCERIN (NITROSTAT) 0.4 MG SL tablet Place 1 tablet (0.4 mg total) under the tongue every 5 (five) minutes as needed.  25 tablet  3  . potassium chloride SA (K-DUR,KLOR-CON) 20 MEQ tablet TAKE 1 TABLET (20 MEQ TOTAL) BY MOUTH 3 (THREE) TIMES DAILY.  270 tablet  4  . ranitidine (ZANTAC) 150 MG tablet Take 150 mg by mouth. 1 tab by mouth as needed.       . sertraline (ZOLOFT) 50 MG tablet Take 50 mg by mouth daily.      [name] TIKOSYN 500 MCG capsule TAKE 1 CAPSULE (500MCG)   BY MOUTH TWICE A DAY  180 capsule  2  . valsartan (DIOVAN) 320 MG tablet TAKE 1 TABLET (320 MG TOTAL) BY MOUTH DAILY.  30 tablet  3  . Vitamins-Lipotropics (SUPER STRESS B-COMPLEX CR PO) Take by mouth.         No current facility-administered medications on file prior to visit.    Allergies  Allergen Reactions  . Horse-Derived Products   . Other     Tetanus Shot    Past Medical History  Diagnosis Date  . Hx of adenomatous colonic polyps   . Diabetes mellitus type II   . Hypertension   . Diverticulosis 2001  . Hyperlipidemia   . Atrial fibrillation   . Joint pain   . Depression     Past Surgical History  Procedure Laterality Date  . Polinydal cyst      Removed  . Great toe arthrodesis, interphalangeal joint      Right foot  . Retina repair-right    . Cataract extraction      bilateral  . Basal cell carcinoma excision      x3 on face  . Polypectomy    . Cardiac catheterization      History  Smoking status  . Former Smoker -- 4.00 packs/day  . Types: Cigarettes  . Quit date: 01/05/1981  Smokeless tobacco  . Never Used    Comment: Stopped 1983    History  Alcohol Use  . Yes  Comment: 1-3 drinks per week     Family History  Problem Relation Age of Onset  . Colon cancer Mother     Family History/Uncle   . Colon polyps Mother     Family History  . Atrial fibrillation Mother   . Hypertension Mother   . Colon polyps Sister     Family history  . Diabetes Maternal Uncle   . Stroke Paternal Uncle   . Dementia Father     Reviw of Systems:  Reviewed in the HPI.  All other systems are negative.  Physical Exam: BP 148/68  Pulse 69  Ht 5' 10.5" (1.791 m)  Wt 202 lb (91.627 kg)  BMI 28.56 kg/m2 The patient is alert and oriented x 3.  The mood and affect are normal.   Skin: warm and dry.  Color is normal.    HEENT:   the sclera are nonicteric.  The mucous membranes are moist.  The carotids are 2+ without bruits.  There is no thyromegaly.  There is no JVD.    Lungs: clear.  The chest wall is non tender.    Heart: irreg. Irreg.   with a normal S1 and S2.  He has a hyperdynamic PMI. He has a very soft but brief 2/6 systolic murmur at the left cardiac  border. 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  Abdomen: good bowel sounds.  There is no guarding or rebound.  There is no hepatosplenomegaly or tenderness.  There are no masses.   Extremities:  no clubbing, cyanosis, or edema.  The legs are without rashes.  The distal pulses are intact.   Neuro:  Cranial nerves II - XII are intact.  Motor and sensory functions are intact.    The gait is normal.  ECG:    Assessment / Plan:

## 2013-01-16 NOTE — Assessment & Plan Note (Signed)
[name] remains in atrial fib.  In addition, he is also having more dyspnea with exertion, more leg edema and general fatigue.  The [name] is not working very well.  He is on the maximal dose of Tikosyn.  At this point, we will DC the tikosyn .  We will get an echo.  I will see him again in 2 weeks to see if he needs any additional meds added on.   He will call me sooner if his HR increases dramatically.

## 2013-01-16 NOTE — Assessment & Plan Note (Signed)
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  Will get an echo for further evaluation      He has not been able to take beta blockers since being on tikosyn  - we may be able to start a beta blocker after stopping the Tikosyn.

## 2013-01-17 ENCOUNTER — Ambulatory Visit (HOSPITAL_COMMUNITY): Payer: Medicare Other | Attending: Cardiovascular Disease | Admitting: Radiology

## 2013-01-17 ENCOUNTER — Encounter: Payer: Self-pay | Admitting: Cardiovascular Disease

## 2013-01-17 DIAGNOSIS — I10 Essential (primary) hypertension: Secondary | ICD-10-CM | POA: Insufficient documentation

## 2013-01-17 DIAGNOSIS — I4891 Unspecified atrial fibrillation: Secondary | ICD-10-CM | POA: Insufficient documentation

## 2013-01-17 DIAGNOSIS — E119 Type 2 diabetes mellitus without complications: Secondary | ICD-10-CM | POA: Insufficient documentation

## 2013-01-17 DIAGNOSIS — I509 Heart failure, unspecified: Secondary | ICD-10-CM | POA: Insufficient documentation

## 2013-01-17 DIAGNOSIS — E785 Hyperlipidemia, unspecified: Secondary | ICD-10-CM | POA: Insufficient documentation

## 2013-01-17 DIAGNOSIS — I517 Cardiomegaly: Secondary | ICD-10-CM

## 2013-01-17 DIAGNOSIS — I359 Nonrheumatic aortic valve disorder, unspecified: Secondary | ICD-10-CM | POA: Insufficient documentation

## 2013-01-17 DIAGNOSIS — Z87891 Personal history of nicotine dependence: Secondary | ICD-10-CM | POA: Insufficient documentation

## 2013-01-17 NOTE — Progress Notes (Signed)
Echocardiogram performed.  

## 2013-01-30 ENCOUNTER — Encounter: Payer: Self-pay | Admitting: Cardiovascular Disease

## 2013-01-30 ENCOUNTER — Ambulatory Visit (INDEPENDENT_AMBULATORY_CARE_PROVIDER_SITE_OTHER): Payer: Medicare Other | Admitting: Cardiovascular Disease

## 2013-01-30 VITALS — BP 151/87 | HR 60 | Ht 71.0 in | Wt 206.5 lb

## 2013-01-30 DIAGNOSIS — I5032 Chronic diastolic (congestive) heart failure: Secondary | ICD-10-CM

## 2013-01-30 DIAGNOSIS — R002 Palpitations: Secondary | ICD-10-CM

## 2013-01-30 DIAGNOSIS — I4891 Unspecified atrial fibrillation: Secondary | ICD-10-CM

## 2013-01-30 DIAGNOSIS — I509 Heart failure, unspecified: Secondary | ICD-10-CM

## 2013-01-30 MED ORDER — TRIAMTERENE-HCTZ 37.5-25 MG PO TABS
1.0000 | ORAL_TABLET | Freq: Every day | ORAL | Status: DC
Start: 2013-01-30 — End: 2013-04-14

## 2013-01-30 NOTE — Progress Notes (Signed)
[name] Date of Birth  1940-06-05 [facility] [address]    [place], [place]  [id number] [phone]  Fax  [phone]  Problem list: 1. Hypertension 2. HOCM 3. Aortic insuffuciency 4. Atrial fibrillation-failed Tikosyn,   Has seen [facility] in [place], [place] .   5. Diabetes mellitus 6. 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 He has a hyperdynamic LV with systolic anterior motion of the mitral valve leaflet.  History of Present Illness:  [name] is a 73 year old gentleman with a history of hypertension, atrial fibrillation and a myocardial bridge.  He is a previous patient of Dr. Susa Simmonds. He also has a history of diabetes. He has felt fairly well. He has not had any episodes of chest pain or shortness of breath.    Nov. 25, 2013: He is very symptomatic with his Atrial fib.  He gets very winded with walking.  He has been on Eliquis and tikosyn 500 BID.  He had an episode of severe dyspnea while at [place] farm in [place].  He was diagnosed with panic attack.  Enzymes were normal.  He is ready for his cardioversion.  Nov. 27, 2013: [name] was seen 2 days ago and was found to have atrial fibrillation. He was scheduled for cardioversion. When he showed up for cardioversion yesterday, he was in normal sinus rhythm.  He related to me that he had some medication changes over the past several weeks. He was previously on Tikosyn and citalopram and his atrial fibrillation seemed to be well-controlled on that.  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  It was at that time that he started having increased palpitations, anxiety, and we noted him to be in atrial fibrillation. He then discontinued the Wellbutrin and was started on Zoloft.  He seems to be tolerating this combination. It was at that time that he showed up in the short stay Center and was found to  have already converted to sinus rhythm.  May 31, 2012:  [name] is doing well.  Still in NSR.  No leg edema.  He sticks to a low sodium diet.   He has occasional CP - typically associated with arm / chest movement.   He wants to stop coumadin - interferes with his arthritis medications.  He has maintained NSR since his last visit.    Nov. 24, 2014:  [name] is doing OK.  He has some brief palps - he thinks they are PVCs.  No sustained episodes of Afib.   He has been getting Avastin injections into his eyes.    Jan. 12, 2015:  [name] is seen today as a work in visit for worsening ankle edema.   He has not been able to associate his symptoms with change of diet.  .  he has also been noticing some increasing shortness of breath.  He notes short or shortness of breath with any activity-example take the garbage cans out.   He denies any PND. He uses a CPAP mask.    Jan. 26, 2015:  [name] was seen 2 weeks ago with symptoms consistent with congestive heart failure. He had been in persistent atrial fibrillation and so his tikosyn  was stopped.  He still atrial fibrillation. His rate is well controlled.     He had an echocardiogram: Left ventricle: Wall thickness was increased in a pattern of moderate LVH. Systolic function was  vigorous. The estimated ejection fraction was in the range of 75% to 80%. Wall motion was normal; there were no regional wall motion abnormalities. - Aortic valve: Trivial regurgitation. - Left atrium: The atrium was mildly to moderately dilated. - Right ventricle: The cavity size was mildly dilated. Systolic function was mildly reduced. - Right atrium: The atrium was moderately dilated. - Pulmonary arteries: Systolic pressure was mildly increased. PA peak pressure: 4mm Hg  He was found to have mild pulmonary hypertension.   He continues to have some dyspnea.  He has ankle edema.  His BP log shows mildly elevated BP.  Current Outpatient Prescriptions on File Prior to  Visit  Medication Sig Dispense Refill  . acetaminophen (TYLENOL) 500 MG tablet Take 1,000 mg by mouth every 6 (six) hours as needed.       . ALPRAZolam (XANAX) 0.25 MG tablet Take by mouth at bedtime as needed. Taking 0.25mg  to 1mg  PRN      . amLODipine (NORVASC) 10 MG tablet Take 10 mg by mouth 2 (two) times daily.      [name] aspirin 325 MG EC tablet Take 8 tablets daily. Pt sometimes takes 10-12 tablets a day (01/16/13)      . atorvastatin (LIPITOR) 20 MG tablet Take 20 mg by mouth daily. 3 times weekly      . BESIVANCE 0.6 % SUSP Use in left eye 4 times a day after each injection      . Cholecalciferol (VITAMIN D) 2000 UNITS CAPS Take 3 capsules by mouth daily.        [name] CINNAMON PO Take 2,000 mg by mouth daily.      [name] CLONIDINE HCL PO Take 0.2 mg by mouth 3 (three) times daily.       . diphenhydrAMINE (BENADRYL) 25 MG tablet Take 25 mg by mouth every 6 (six) hours as needed.        . furosemide (LASIX) 20 MG tablet Take 20 mg by mouth. Taking 3 Times a week      . glimepiride (AMARYL) 2 MG tablet Take 2 mg by mouth daily before breakfast.        . HYDROcodone-acetaminophen (VICODIN) 5-500 MG per tablet Take 1 tablet by mouth every 6 (six) hours as needed.        . metFORMIN (GLUCOPHAGE-XR) 500 MG 24 hr tablet Take 500 mg by mouth 2 (two) times daily.      . minoxidil (LONITEN) 10 MG tablet Take 2 tabs QHS  60 tablet  11  . nitroGLYCERIN (NITROSTAT) 0.4 MG SL tablet Place 1 tablet (0.4 mg total) under the tongue every 5 (five) minutes as needed.  25 tablet  3  . potassium chloride SA (K-DUR,KLOR-CON) 20 MEQ tablet TAKE 1 TABLET (20 MEQ TOTAL) BY MOUTH 3 (THREE) TIMES DAILY.  270 tablet  4  . ranitidine (ZANTAC) 150 MG tablet Take 150 mg by mouth. 1 tab by mouth as needed.       . sertraline (ZOLOFT) 50 MG tablet Take 50 mg by mouth daily.      . valsartan (DIOVAN) 320 MG tablet TAKE 1 TABLET (320 MG TOTAL) BY MOUTH DAILY.  30 tablet  3  . Vitamins-Lipotropics (SUPER STRESS B-COMPLEX CR PO) Take by  mouth.         No current facility-administered medications on file prior to visit.    Allergies  Allergen Reactions  . Horse-Derived Products   . Other     Tetanus Shot    Past  Medical History  Diagnosis Date  . Hx of adenomatous colonic polyps   . Diabetes mellitus type II   . Hypertension   . Diverticulosis 2001  . Hyperlipidemia   . Atrial fibrillation   . Joint pain   . Depression     Past Surgical History  Procedure Laterality Date  . Polinydal cyst      Removed  . Great toe arthrodesis, interphalangeal joint      Right foot  . Retina repair-right    . Cataract extraction      bilateral  . Basal cell carcinoma excision      x3 on face  . Polypectomy    . Cardiac catheterization      History  Smoking status  . Former Smoker -- 4.00 packs/day  . Types: Cigarettes  . Quit date: 01/05/1981  Smokeless tobacco  . Never Used    Comment: Stopped 1983    History  Alcohol Use  . Yes    Comment: 1-3 drinks per week    Family History  Problem Relation Age of Onset  . Colon cancer Mother     Family History/Uncle   . Colon polyps Mother     Family History  . Atrial fibrillation Mother   . Hypertension Mother   . Colon polyps Sister     Family history  . Diabetes Maternal Uncle   . Stroke Paternal Uncle   . Dementia Father     Reviw of Systems:  Reviewed in the HPI.  All other systems are negative.  Physical Exam: BP 151/87  Pulse 60  Ht 5\' 11"  (1.803 m)  Wt 206 lb 8 oz (93.668 kg)  BMI 28.81 kg/m2 The patient is alert and oriented x 3.  The mood and affect are normal.   Skin: warm and dry.  Color is normal.    HEENT:   the sclera are nonicteric.  The mucous membranes are moist.  The carotids are 2+ without bruits.  There is no thyromegaly.  There is no JVD.    Lungs: clear.  The chest wall is non tender.    Heart: irreg. Irreg.   with a normal S1 and S2.  He has a hyperdynamic PMI. He has a very soft but brief 2/6 systolic murmur at the  left cardiac  border. 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  Abdomen: good bowel sounds.  There is no guarding or rebound.  There is no hepatosplenomegaly or tenderness.  There are no masses.   Extremities:  no clubbing, cyanosis, or edema.  The legs are without rashes.  The distal pulses are intact.   Neuro:  Cranial nerves II - XII are intact.  Motor and sensory functions are intact.    The gait is normal.  ECG:    Assessment / Plan:

## 2013-01-30 NOTE — Assessment & Plan Note (Signed)
He was previously well controlled on Tikosyn.  He has previously seen Dr. Rolland Porter but no plans for atrial fibrillation ablation were canceled when he converted and maintained sinus rhythm on Tikosyn.  Will refer him to Dr. Rayann Heman for further consideration of Afib ablation.  I considered starting him on Amiodarone and attempting cardioversion in 1 month but I will leave further management up to Dr. Rayann Heman for now.

## 2013-01-30 NOTE — Patient Instructions (Signed)
Your physician recommends that you return for lab work in:  1 week   Your physician has recommended you make the following change in your medication:  Stop lasix  Start Maxzide 37.5/25 mg once daily   Your physician recommends that you schedule a follow-up appointment in:  3 months   Please see Dr. Rayann Heman for afib/consideration for ablation.

## 2013-01-30 NOTE — Assessment & Plan Note (Addendum)
Estaban's echo shows well-preserved left ventricular systolic function. He does have some left ventricular hypertrophy. There is no clear-cut evidence of a left ventricular outflow tract obstruction. It is difficult to say whether or not he has diastolic dysfunction given his chronic atrial fibrillation but he has been diagnosed with diastolic dysfunction in the past.  He has been treated with Lasix 3 times a week. There has been some concern about his magnesium and potassium levels so he was not treated aggressively with diuretics because he was on Tikosyn.  No w that he is off tikosyn, i think we can be more aggressive with his diuretics.  Will DC lasix, start maxzide 37.5 / 25 mg a day.    Continue current dose of Kdur.  Will check BMP in 1 week.    Have encouraged him to ambulate.

## 2013-02-06 ENCOUNTER — Other Ambulatory Visit (INDEPENDENT_AMBULATORY_CARE_PROVIDER_SITE_OTHER): Payer: Medicare Other

## 2013-02-06 DIAGNOSIS — E785 Hyperlipidemia, unspecified: Secondary | ICD-10-CM

## 2013-02-06 DIAGNOSIS — I10 Essential (primary) hypertension: Secondary | ICD-10-CM

## 2013-02-06 DIAGNOSIS — I4891 Unspecified atrial fibrillation: Secondary | ICD-10-CM

## 2013-02-06 LAB — LIPID PANEL
CHOLESTEROL: 134 mg/dL (ref 0–200)
HDL: 34.8 mg/dL — ABNORMAL LOW (ref >39.00)
LDL Cholesterol: 73 mg/dL (ref 0–99)
Total CHOL/HDL Ratio: 4
Triglycerides: 133 mg/dL (ref 0.0–149.0)
VLDL: 26.6 mg/dL (ref 0.0–40.0)

## 2013-02-06 LAB — HEPATIC FUNCTION PANEL
ALK PHOS: 68 U/L (ref 39–117)
ALT: 17 U/L (ref 0–53)
AST: 15 U/L (ref 0–37)
Albumin: 4.3 g/dL (ref 3.5–5.2)
BILIRUBIN DIRECT: 0.1 mg/dL (ref 0.0–0.3)
BILIRUBIN TOTAL: 1.1 mg/dL (ref 0.3–1.2)
Total Protein: 7.5 g/dL (ref 6.0–8.3)

## 2013-02-09 ENCOUNTER — Encounter (INDEPENDENT_AMBULATORY_CARE_PROVIDER_SITE_OTHER): Payer: Medicare Other | Admitting: Ophthalmology

## 2013-02-09 DIAGNOSIS — I10 Essential (primary) hypertension: Secondary | ICD-10-CM

## 2013-02-09 DIAGNOSIS — H348392 Tributary (branch) retinal vein occlusion, unspecified eye, stable: Secondary | ICD-10-CM

## 2013-02-09 DIAGNOSIS — H35039 Hypertensive retinopathy, unspecified eye: Secondary | ICD-10-CM

## 2013-02-24 ENCOUNTER — Other Ambulatory Visit (INDEPENDENT_AMBULATORY_CARE_PROVIDER_SITE_OTHER): Payer: Medicare Other

## 2013-02-24 ENCOUNTER — Telehealth: Payer: Self-pay | Admitting: *Deleted

## 2013-02-24 DIAGNOSIS — I4891 Unspecified atrial fibrillation: Secondary | ICD-10-CM

## 2013-02-24 DIAGNOSIS — E785 Hyperlipidemia, unspecified: Secondary | ICD-10-CM

## 2013-02-24 DIAGNOSIS — I10 Essential (primary) hypertension: Secondary | ICD-10-CM

## 2013-02-24 LAB — BASIC METABOLIC PANEL
BUN: 14 mg/dL (ref 6–23)
CO2: 30 mEq/L (ref 19–32)
CREATININE: 1 mg/dL (ref 0.4–1.5)
Calcium: 9.2 mg/dL (ref 8.4–10.5)
Chloride: 101 mEq/L (ref 96–112)
GFR: 78.56 mL/min (ref >60.00)
GLUCOSE: 192 mg/dL — AB (ref 70–99)
POTASSIUM: 3.7 meq/L (ref 3.5–5.1)
Sodium: 138 mEq/L (ref 135–145)

## 2013-02-24 NOTE — Telephone Encounter (Signed)
Informed patient that when he had his last lab draw the BMP was somehow missed at the [address] office. Instructed patient to go to the [address] office to have it done. Per [employer] there should not be a copay.

## 2013-02-27 ENCOUNTER — Ambulatory Visit (INDEPENDENT_AMBULATORY_CARE_PROVIDER_SITE_OTHER): Payer: Medicare Other | Admitting: Internal Medicine

## 2013-02-27 ENCOUNTER — Encounter: Payer: Self-pay | Admitting: Internal Medicine

## 2013-02-27 ENCOUNTER — Other Ambulatory Visit: Payer: Self-pay | Admitting: Cardiovascular Disease

## 2013-02-27 VITALS — BP 142/82 | HR 67 | Ht 71.0 in | Wt 208.0 lb

## 2013-02-27 DIAGNOSIS — Z7901 Long term (current) use of anticoagulants: Secondary | ICD-10-CM

## 2013-02-27 DIAGNOSIS — I10 Essential (primary) hypertension: Secondary | ICD-10-CM

## 2013-02-27 DIAGNOSIS — I4891 Unspecified atrial fibrillation: Secondary | ICD-10-CM

## 2013-02-27 NOTE — Patient Instructions (Signed)
You have been referred to Dr Roxy Manns for afib

## 2013-02-27 NOTE — Progress Notes (Addendum)
Primary Care Physician: Alesia Richards, MD Referring Physician:  Dr Park Meo [name] is a 73 y.o. male with a h/o persistent atrial fibrillation who presents for EP consultation.  He reports that he was initially diagnosed with atrial fibrillation more than 10 years ago.  He was cardioverted several times by Dr Doreatha Lew.  He was evaluated by Dr Rolland Porter who recommended tikosyn and follow-up for ablation.  He did not return for follow-up but did very well on tikosyn for 8-9 years.  He reports that around October of 2014 his atrial arrhythmias progressed.  He has been in afib most of the time since October.  During atrial fibrillation, he reports symptoms of shortness of breath and decreased exercise tolerance.  He has occasional palpitations.  He has not tried other AADs.  Today, he denies symptoms of chest pain, orthopnea, PND, lower extremity edema, dizziness, presyncope, syncope, or neurologic sequela. The patient is tolerating medications without difficulties and is otherwise without complaint today.   Past Medical History  Diagnosis Date  . Hx of adenomatous colonic polyps   . Diabetes mellitus type II   . Hypertension   . Diverticulosis 2001  . Hyperlipidemia   . Persistent atrial fibrillation   . DJD (degenerative joint disease)   . Depression   . Obstructive sleep apnea     compliant with CPAP   Past Surgical History  Procedure Laterality Date  . Polinydal cyst      Removed  . Great toe arthrodesis, interphalangeal joint      Right foot  . Retina repair-right    . Cataract extraction      bilateral  . Basal cell carcinoma excision      x3 on face  . Polypectomy    . Cardiac catheterization      myocardial bridge but no cad    Current Outpatient Prescriptions  Medication Sig Dispense Refill  . acetaminophen (TYLENOL) 500 MG tablet Take 1,000 mg by mouth every 6 (six) hours as needed.       . ALPRAZolam (XANAX) 0.25 MG tablet Take 0.25 mg to 1 mg as needed       . amLODipine (NORVASC) 10 MG tablet Take 10 mg by mouth 2 (two) times daily.      [name] aspirin 325 MG EC tablet Take 8 tablets daily. Pt sometimes takes 10-12 tablets a day (01/16/13)      . atorvastatin (LIPITOR) 20 MG tablet Take 1 tablet 3 times a week      . BESIVANCE 0.6 % SUSP Use in left eye 4 times a day after each injection      . Cholecalciferol (VITAMIN D) 2000 UNITS CAPS Take 3 capsules by mouth daily.        [name] CINNAMON PO Take 2,000 mg by mouth daily.      [name] CLONIDINE HCL PO Take 0.2 mg by mouth 3 (three) times daily.       . diphenhydrAMINE (BENADRYL) 25 MG tablet Take 25 mg by mouth every 6 (six) hours as needed.        [name] glimepiride (AMARYL) 2 MG tablet Take 2 mg by mouth daily before breakfast.        . HYDROcodone-acetaminophen (VICODIN) 5-500 MG per tablet Take 1 tablet by mouth every 6 (six) hours as needed.        . metFORMIN (GLUCOPHAGE-XR) 500 MG 24 hr tablet Take 500 mg by mouth 2 (two) times daily.      . minoxidil (  LONITEN) 10 MG tablet Take 2 tabs QHS  60 tablet  11  . nitroGLYCERIN (NITROSTAT) 0.4 MG SL tablet Place 1 tablet (0.4 mg total) under the tongue every 5 (five) minutes as needed.  25 tablet  3  . potassium chloride SA (K-DUR,KLOR-CON) 20 MEQ tablet TAKE 1 TABLET (20 MEQ TOTAL) BY MOUTH 3 (THREE) TIMES DAILY.  270 tablet  4  . ranitidine (ZANTAC) 150 MG tablet 1 tab by mouth as needed.      . sertraline (ZOLOFT) 50 MG tablet Take 50 mg by mouth daily.      [name] triamterene-hydrochlorothiazide (MAXZIDE-25) 37.5-25 MG per tablet Take 1 tablet by mouth daily.  30 tablet  12  . valsartan (DIOVAN) 320 MG tablet TAKE 1 TABLET (320 MG TOTAL) BY MOUTH DAILY.  30 tablet  3  . Vitamins-Lipotropics (SUPER STRESS B-COMPLEX CR PO) Take 1 capsule by mouth daily.        No current facility-administered medications for this visit.    Allergies  Allergen Reactions  . Horse-Derived Products   . Other     Tetanus Shot  . Sunflower Seed [Sunflower Oil]     History    Social History  . Marital Status: Married    Spouse Name: N/A    Number of Children: 1  . Years of Education: N/A   Occupational History  . retired Software engineer    Social History Main Topics  . Smoking status: Former Smoker -- 4.00 packs/day    Types: Cigarettes    Quit date: 01/05/1981  . Smokeless tobacco: Never Used     Comment: Stopped 1983  . Alcohol Use: Yes     Comment: 1-5 drinks per week  . Drug Use: No  . Sexual Activity: Not on file   Other Topics Concern  . Not on file   Social History Narrative   Daily caffeine-yes   Patient gets regular exercise.   Pt lives in [place] with spouse.  Retired Software engineer.  Family History  Problem Relation Age of Onset  . Colon cancer Mother     Family History/Uncle   . Colon polyps Mother     Family History  . Atrial fibrillation Mother   . Hypertension  Mother   . Colon polyps Sister     Family history  . Diabetes Maternal Uncle   . Stroke Paternal Uncle   . Dementia Father     ROS- All systems are reviewed and negative except as per the HPI above  Physical Exam: Filed Vitals:   02/27/13 0826  BP: 142/82  Pulse: 67  Height: 5\' 11"  (1.803 m)  Weight: 208 lb (94.348 kg)    GEN- The patient is well appearing, alert and oriented x 3 today.   Head- normocephalic, atraumatic Eyes-  Sclera clear, conjunctiva pink Ears- hearing intact Oropharynx- clear Neck- supple, no JVP Lymph- no cervical lymphadenopathy Lungs- Clear to ausculation bilaterally, normal work of breathing Heart- irregular rate and rhythm, no murmurs, rubs or gallops, PMI not laterally displaced GI- soft, NT, ND, + BS Extremities- no clubbing, cyanosis, or edema MS- no significant deformity or atrophy Skin- no rash or lesion Psych- euthymic mood, full affect Neuro- strength and sensation are intact  EKG today reveals afib, V rate 67 bpm, septal infarct, nonspecific ST/ T changes Echo is reviewed Epic records including Dr Lanny Hurst notes are reviewed  Assessment and Plan:   1. persistent afib The patient has a h/o progressive afib.  He has failed medical therapy with tikosyn.  He takes very high doses of aspirin daily for his arthritis and is not willing to considered alternatives.  This significantly limits our ability to anticoagulate the patient.  His chads2vasc score is at least three and he should be anticoagulated long term.  He is clearly symptomatic with his afib despite adequate rate control. Therapeutic strategies for afib including medicine and ablation were discussed in detail with the patient today. His anticipated success rates with ablation are significantly reduced (on the order of 60%).  Amiodarone was discussed as an alternative.  He is aware that either approach would require anticoagulation.  We also had a discussion today about surgical  alternatives.  I think that he might be best served with a surgical mini-MAZE and left atrial appendage clip.  This would certainly offer an alternative to anticoagulation long term. He is interested in surgical maze and LAA ligation.  I will therefore refer the patient to Dr Roxy Manns for further assessment.     2. HTN Stable No change required today   [Addendum: I have been asked by Dr. Roxy Manns to perform a cardiac catheterization to exclude progression of CAD prior to MAZE procedure. Addendum to this note in order to place hospital orders for cath.  Darlina Guys, MD at 4:56pm on 03/03/13]

## 2013-03-01 ENCOUNTER — Encounter: Payer: Self-pay | Admitting: Thoracic Surgery (Cardiothoracic Vascular Surgery)

## 2013-03-01 ENCOUNTER — Institutional Professional Consult (permissible substitution) (INDEPENDENT_AMBULATORY_CARE_PROVIDER_SITE_OTHER): Payer: Medicare Other | Admitting: Thoracic Surgery (Cardiothoracic Vascular Surgery)

## 2013-03-01 VITALS — BP 153/77 | HR 73 | Resp 18 | Ht 71.0 in | Wt 201.0 lb

## 2013-03-01 DIAGNOSIS — I4819 Other persistent atrial fibrillation: Secondary | ICD-10-CM

## 2013-03-01 DIAGNOSIS — I509 Heart failure, unspecified: Secondary | ICD-10-CM

## 2013-03-01 DIAGNOSIS — I4891 Unspecified atrial fibrillation: Secondary | ICD-10-CM

## 2013-03-01 DIAGNOSIS — I428 Other cardiomyopathies: Secondary | ICD-10-CM

## 2013-03-01 DIAGNOSIS — I43 Cardiomyopathy in diseases classified elsewhere: Secondary | ICD-10-CM

## 2013-03-01 DIAGNOSIS — I5032 Chronic diastolic (congestive) heart failure: Secondary | ICD-10-CM | POA: Insufficient documentation

## 2013-03-01 DIAGNOSIS — I119 Hypertensive heart disease without heart failure: Secondary | ICD-10-CM | POA: Insufficient documentation

## 2013-03-01 NOTE — H&P (Addendum)
[address]       [place] [address]             [phone]     CARDIOTHORACIC SURGERY CONSULTATION REPORT  Referring Provider is Thompson Grayer, MD PCP is Alesia Richards, MD  Chief Complaint  Patient presents with  . 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 The patient first began to develop palpitations and chest pain nearly 20 years ago. Initially he was diagnosed with PVCs, and cardiac catheterization performed by Dr. Doreatha Lew revealed no significant coronary artery disease but the presence of a coronary myocardial bridge.  Approximately 12 years ago he first developed atrial fibrillation. He underwent DC cardioversion and maintained sinus rhythm for about a year or two, but he subsequently relapsed into atrial fibrillation. He underwent a total of 2 additional cardioversions, each with progressively shorter period of time where he remained in sinus rhythm.  Ultimately he was referred to Dr. Rolland Porter in [place] for possible ablation approximately 8 or 9 years ago. He was started on Tikosyn and did quite well, maintaining sinus rhythm for several years. Ablation was never performed.  More recently he has been followed by Dr. Acie Fredrickson.  He has always been reluctant to consider long-term anticoagulation with Coumadin. He was treated with Eliquis for a period of time, but he had problems with excessive bleeding from minor cuts and fingersticks for blood glucose monitoring, and he developed rectal bleeding after a colonoscopy.  He chronically takes fairly high dose aspirin for arthritis.  This past fall the patient began to  experience episodes of palpitations with worsening exertional shortness of breath. He has remained in persistent atrial fibrillation since that time. He was seen in followup by Dr. Acie Fredrickson and an echocardiogram was performed demonstrating normal left ventricular systolic function with severe left ventricular hypertrophy and significant diastolic dysfunction. There was no significant valvular disease and no sign of significant dynamic left ventricular output tract obstruction. He was referred to Dr. Rayann Heman to consider atrial fibrillation ablation. However, because of the size of his left atrium and his underlying history, Dr. Rayann Heman is concerned that the likely it likelihood of success with ablation may be relatively low.  Furthermore, with concerns regarding the need for long term anticoagulation the possibility of eliminating the left atrial appendage may be of significant benefit.  The patient has been referred to consider surgical maze procedure.  The patient remains relatively active for a gentleman his age. He does have significant arthritis, but this does not seem to limit him to much.  He describes shortness of breath with moderately strenuous physical activity that has progressed over the last several months since he went back into atrial fibrillation. He does not get short of breath with ordinary physical activities, but he does complain that his exertional shortness of breath bothers him to a significant degree.  He denies any history of resting shortness of breath, PND, orthopnea, dizzy spells, or syncope. He has occasional atypical symptoms of chest pain that are not related to physical exertion.    Past Medical History  Diagnosis Date  . Hx of adenomatous colonic polyps   . Diabetes mellitus type II   .  Hypertension   . Diverticulosis 2001  . Hyperlipidemia   . Persistent atrial fibrillation   . DJD (degenerative joint disease)   . Depression   . Obstructive sleep apnea     compliant with  CPAP  . Chronic diastolic congestive heart failure   . Hypertensive cardiomyopathy   . Coronary-myocardial bridge     Past Surgical History  Procedure Laterality Date  . Polinydal cyst      Removed  . Great toe arthrodesis, interphalangeal joint      Right foot  . Retina repair-right    . Cataract extraction      bilateral  . Basal cell carcinoma excision      x3 on face  . Polypectomy    . Cardiac catheterization      myocardial bridge but no cad    Family History  Problem Relation Age of Onset  . Colon cancer Mother     Family History/Uncle   . Colon polyps Mother     Family History  . Atrial fibrillation Mother   . Hypertension Mother   . Colon polyps Sister     Family history  . Diabetes Maternal Uncle   . Stroke Paternal Uncle   . Dementia Father     History   Social History  . Marital Status: Married    Spouse Name: N/A    Number of Children: 1  . Years of Education: N/A   Occupational History  . retired Software engineer    Social History Main Topics  . Smoking status: Former Smoker -- 4.00 packs/day    Types: Cigarettes    Quit date: 01/05/1981  . Smokeless tobacco: Never Used     Comment: Stopped 1983  . Alcohol Use: Yes     Comment: 1-5 drinks per week  . Drug Use: No  . Sexual Activity: Not on file   Other Topics Concern  . Not on file   Social History Narrative   Daily caffeine-yes   Patient gets regular exercise.   Pt lives in [place] with spouse.  Retired Software engineer.  Current Outpatient Prescriptions  Medication Sig Dispense Refill  . acetaminophen (TYLENOL) 500 MG tablet Take 1,000 mg by mouth every 6 (six) hours as needed.       . ALPRAZolam (XANAX) 0.25 MG tablet Take 0.25 mg to 1 mg as needed      . amLODipine (NORVASC) 10 MG tablet TAKE 1 TABLET (10 MG TOTAL) BY MOUTH 2 (TWO) TIMES DAILY.  180 tablet  0  . aspirin 325 MG EC tablet Take 8 tablets daily. Pt sometimes takes 10-12 tablets a day (01/16/13)      . atorvastatin (LIPITOR) 20 MG tablet Take 1 tablet 3 times a week      . BESIVANCE 0.6 % SUSP Use in left eye 4 times a day after each injection      . Cholecalciferol (VITAMIN D) 2000 UNITS CAPS Take 3 capsules by mouth daily.        [name] CINNAMON PO Take 2,000 mg by mouth daily.      [name] CLONIDINE HCL PO Take 0.2 mg by mouth 3 (three) times daily.       . diphenhydrAMINE (BENADRYL) 25 MG tablet Take 25 mg by mouth every 6 (six) hours as needed.        [name] glimepiride (AMARYL) 2 MG tablet Take 2 mg by mouth daily before breakfast.        . HYDROcodone-acetaminophen (VICODIN) 5-500 MG per tablet Take 1 tablet by mouth every 6 (six) hours as needed.        . metFORMIN (GLUCOPHAGE-XR) 500 MG 24 hr tablet Take 500 mg by mouth 2 (two) times daily.      . minoxidil (LONITEN) 10 MG tablet Take 2 tabs QHS  60 tablet  11  . Multiple Vitamins-Minerals (PRESERVISION  AREDS PO) Take 1 capsule by mouth 2 (two) times daily.      . nitroGLYCERIN (NITROSTAT) 0.4 MG SL tablet Place 1 tablet (0.4 mg total) under the tongue every 5 (five) minutes as needed.  25 tablet  3  . potassium chloride SA (K-DUR,KLOR-CON) 20 MEQ tablet TAKE 1 TABLET (20 MEQ TOTAL) BY MOUTH 3 (THREE) TIMES DAILY.  270 tablet  4  . ranitidine (ZANTAC) 150 MG tablet 1 tab by mouth as needed.      . sertraline (ZOLOFT) 50 MG tablet Take 50 mg by mouth daily.      [name] triamterene-hydrochlorothiazide (MAXZIDE-25) 37.5-25 MG per tablet Take 1 tablet by mouth daily.  30 tablet  12  . valsartan (DIOVAN) 320 MG tablet TAKE 1 TABLET (320 MG TOTAL) BY MOUTH DAILY.  30 tablet  3  . Vitamins-Lipotropics (SUPER STRESS B-COMPLEX CR PO) Take 1 capsule by mouth daily.        No current facility-administered medications for this visit.    Allergies  Allergen Reactions  . Horse-Derived Products   . Other     Tetanus Shot  . Sunflower Seed [Sunflower Oil]       Review of Systems:   General:  normal appetite,  normal energy, no weight gain, no weight loss, no fever  Cardiac:  no chest pain with exertion, no chest pain at rest, + SOB with moderately strenous exertion, no resting SOB, no PND, no orthopnea, + palpitations, + arrhythmia, + atrial fibrillation, + mild LE edema, no dizzy spells, no syncope  Respiratory:  + exertional shortness of breath, no home oxygen, no productive cough, no dry cough, no bronchitis, no wheezing, no hemoptysis, no asthma, no pain with inspiration or cough, +  sleep apnea, + CPAP at night  GI:   no difficulty swallowing, + reflux, no frequent heartburn, + hiatal hernia, no abdominal pain, no constipation, no diarrhea, no hematochezia, no hematemesis, no melena  GU:   no dysuria,  no frequency, no urinary tract infection, no hematuria, no enlarged prostate, no kidney stones, no kidney disease  Vascular:  no pain suggestive of claudication, no pain in feet, no leg cramps, no  varicose veins, no DVT, no non-healing foot ulcer  Neuro:   no stroke, no TIA's, no seizures, no headaches, no temporary blindness one eye,  no slurred speech, no peripheral neuropathy, no chronic pain, no instability of gait, no memory/cognitive dysfunction  Musculoskeletal: + arthritis, no joint swelling, no myalgias, no difficulty walking, normal mobility   Skin:   no rash, no itching, no skin infections, no pressure sores or ulcerations  Psych:   no anxiety, + depression, no nervousness, no unusual recent stress  Eyes:   + blurry vision, no floaters, + recent vision changes, + wears glasses for reading  ENT:   no hearing loss, no loose or painful teeth, no dentures, last saw dentist 3 months ago  Hematologic:  no easy bruising, no abnormal bleeding, no clotting disorder, no frequent epistaxis  Endocrine:  + diabetes, routinely checks CBG's at home     Physical Exam:   BP 153/77  Pulse 73  Resp 18  Ht 5\' 11"  (1.803 m)  Wt 201 lb (91.173 kg)  BMI 28.05 kg/m2  SpO2 96%  General:    well-appearing  HEENT:  Unremarkable   Neck:   no JVD, no bruits, no adenopathy   Chest:   clear to auscultation, symmetrical breath sounds, no wheezes, no rhonchi   CV:   RRR, grade I-II systolic flow murmur best at LSB  Abdomen:  soft, non-tender, no masses   Extremities:  warm, well-perfused, pulses diminished but palpable, no LE edema  Rectal/GU  Deferred  Neuro:   Grossly non-focal and symmetrical throughout  Skin:   Clean and dry, no rashes, no breakdown   Diagnostic Tests:  ECG 12 lead ECG erformed 02/27/2013 demonstrates atrial fibrillation   Echocardiography  Patient:    [name], Sheffler MR #:       [id number] Study Date: 01/17/2013 Gender:     M Age:        18 Height:     177.8cm Weight:     89.4kg BSA:        2.32m^2 Pt. Status: Room:    ATTENDING    Nahser, Aundria Mems     Nahser, Jasmine Awe    Nahser, Philip  SONOGRAPHER  Cindy Hazy, RDCS  PERFORMING   Chmg,  Outpatient cc:  ------------------------------------------------------------ LV EF: 75% -   80%  ------------------------------------------------------------ Indications:      Atrial fibrillation - 427.31.  ------------------------------------------------------------ History:   PMH:  Acquired from the patient and from the patient's chart.  PMH:  Aortic Insufficiency. CHF. Atrial Fibrillation. HOCM.  Risk factors:  Former tobacco use. Hypertension. Diabetes mellitus. Dyslipidemia.  ------------------------------------------------------------ Study Conclusions  - Left ventricle: Wall thickness was increased in a pattern   of moderate LVH. Systolic function was vigorous. The   estimated ejection fraction was in the range of 75% to   80%. Wall motion was normal; there were no regional wall   motion abnormalities. - Aortic valve: Trivial regurgitation. - Left atrium: The atrium was mildly to moderately dilated. - Right ventricle: The cavity size was  mildly dilated.   Systolic function was mildly reduced. - Right atrium: The atrium was moderately dilated. - Pulmonary arteries: Systolic pressure was mildly   increased. PA peak pressure: 73mm Hg (S). Echocardiography.  M-mode, complete 2D, spectral Doppler, and color Doppler.  Height:  Height: 177.8cm. Height: 70in. Weight:  Weight: 89.4kg. Weight: 196.6lb.  Body mass index: BMI: 28.3kg/m^2.  Body surface area:    BSA: 2.39m^2.  Blood pressure:     148/68.  Patient status:  Outpatient. Location:  [facility]  ------------------------------------------------------------  ------------------------------------------------------------ Left ventricle:   Wall thickness was increased in a pattern of moderate LVH.   Systolic function was vigorous. The estimated ejection fraction was in the range of 75% to 80%. Wall motion was normal; there were no regional wall  motion abnormalities.  ------------------------------------------------------------ Aortic valve:   Structurally normal valve.   Cusp separation was normal.  Doppler:  Transvalvular velocity was within the normal range. There was no stenosis.  Trivial regurgitation.   ------------------------------------------------------------ Aorta:  Ascending aorta: The ascending aorta was mildly dilated.  ------------------------------------------------------------ Mitral valve:   Structurally normal valve.   Leaflet separation was normal.  Doppler:  Transvalvular velocity was within the normal range. There was no evidence for stenosis.  Trivial regurgitation.    Peak gradient: 71mm Hg (D).  ------------------------------------------------------------ Left atrium:  The atrium was mildly to moderately dilated.   ------------------------------------------------------------ Right ventricle:  The cavity size was mildly dilated. Systolic function was mildly reduced.  ------------------------------------------------------------ Pulmonic valve:    Structurally normal valve.   Cusp separation was normal.  Doppler:  Transvalvular velocity was within the normal range.  Trivial regurgitation.  ------------------------------------------------------------ Tricuspid valve:   Structurally normal valve.   Leaflet separation was normal.  Doppler:  Transvalvular velocity was within the normal range.  Trivial regurgitation.  ------------------------------------------------------------ Pulmonary artery:   Systolic pressure was mildly increased.   ------------------------------------------------------------ Right atrium:  The atrium was moderately dilated.  ------------------------------------------------------------ Pericardium:  There was no pericardial effusion.  ------------------------------------------------------------  2D measurements        Normal  Doppler measurements   Normal Left ventricle                  Main pulmonary LVID ED,   45.3 mm     43-52   artery chord,                         Pressure, S  42 mm Hg  =30 PLAX                           Left ventricle LVID ES,   23.6 mm     23-38   Ea, lat     17. cm/s   ------ chord,                         ann, tiss     9 PLAX                           DP FS, chord,   48 %      >29     E/Ea, lat   6.8        ------ PLAX                           ann, tiss  2 LVPW, ED   16.9 mm     ------  DP IVS/LVPW   1.25        <1.3    Ea, med     12. cm/s   ------ ratio, ED                      ann, tiss     5 Ventricular septum             DP IVS, ED    21.1 mm     ------  E/Ea, med   9.7        ------ LVOT                           ann, tiss     6 Diam, S      20 mm     ------  DP Area       3.14 cm^2   ------  LVOT Diam         20 mm     ------  Peak vel, S 151 cm/s   ------ Aorta                          VTI, S      29. cm     ------ Root diam,   43 mm     ------                3 ED                             Peak          9 mm Hg  ------ AAo AP       42 mm     ------  gradient, S diam, S                        Stroke vol   92 ml     ------ Left atrium                    Stroke      44. ml/m^2 ------ AP dim       47 mm     ------  index         5 AP dim     2.27 cm/m^2 <2.2    Mitral valve index                          Peak E vel  122 cm/s   ------                                Deceleratio 194 ms     150-23                                n time                 0                                Peak  6 mm Hg  ------                                gradient, D                                Tricuspid valve                                Regurg peak 304 cm/s   ------                                vel                                Peak RV-RA   37 mm Hg  ------                                gradient, S                                Systemic veins                                Estimated     5 mm Hg  ------                                 CVP                                Right ventricle                                Pressure, S  42 mm Hg  <30                                Sa vel, lat 25. cm/s   ------                                ann, tiss     3                                DP   ------------------------------------------------------------ Prepared and Electronically Authenticated by  Mertie Moores 2015-01-13T15:07:52.360     Impression:  The patient has long-standing recurrent persistent atrial fibrillation and has failed multiple cardioversions in the distant past as well as long-term treatment with Tikosyn.  He has remained in persistent atrial fibrillation for several months and is symptomatic with significant exertional shortness of breath, New York Heart Association functional class II.  Symptoms are likely further exacerbated by the presence of long-standing hypertension with hypertensive cardiomyopathy and significant diastolic dysfunction. He does not have significant valvular heart disease and there  was no sign of significant dynamic left ventricular output tract obstruction on recent transthoracic echocardiogram.  He is otherwise relatively healthy and appears to be a good candidate for surgical intervention.    Plan:  I've discussed the indications, risks, and potential benefits of Maze procedure with the patient and his wife in the office this afternoon. Alternative treatment strategies for the management of atrial fibrillation have been discussed in detail.  Risks associated with surgery have been outlined at length and alternative surgical approaches have been discussed. The patient is very interested in proceeding with surgery in the near future. Under the circumstances I feel that diagnostic heart catheterization to definitively rule out the possibility of significant coronary artery disease would be wise. We will also obtain CT angiogram of the aorta to assess possible cannulation  strategies for surgery. The patient will return in 2 weeks to review the results of his catheterization and CT angiograms and make final plans for surgery.  All of his questions been addressed.  I spent in excess of 80 minutes during the conduct of this office consultation and >50% of this time involved direct face-to-face encounter with the patient for counseling and/or coordination of their care.  Valentina Gu. Roxy Manns, MD 03/01/2013 4:24 PM

## 2013-03-01 NOTE — Patient Instructions (Signed)
Stop taking aspirin

## 2013-03-02 ENCOUNTER — Other Ambulatory Visit: Payer: Self-pay | Admitting: *Deleted

## 2013-03-02 DIAGNOSIS — I7100 Dissection of unspecified site of aorta: Secondary | ICD-10-CM

## 2013-03-02 DIAGNOSIS — I4891 Unspecified atrial fibrillation: Secondary | ICD-10-CM

## 2013-03-03 ENCOUNTER — Encounter (HOSPITAL_COMMUNITY): Payer: Self-pay | Admitting: Pharmacy Technician

## 2013-03-03 ENCOUNTER — Encounter: Payer: Self-pay | Admitting: *Deleted

## 2013-03-03 ENCOUNTER — Telehealth: Payer: Self-pay | Admitting: *Deleted

## 2013-03-03 DIAGNOSIS — I4891 Unspecified atrial fibrillation: Secondary | ICD-10-CM

## 2013-03-03 NOTE — Addendum Note (Signed)
Addended by: Lauree Chandler D on: 03/03/2013 04:59 PM   Modules accepted: Orders

## 2013-03-03 NOTE — Telephone Encounter (Signed)
Received message from Dr. Angelena Form to schedule pt for catheterization next week.  I spoke with pt and cath scheduled for March 07, 2013 at 7:30 AM. I verbally went over all instructions with pt who verbalizes understanding. Pt will come to office for lab work on March 06, 2013. He will pick up printed copy of instructions at front desk when here for lab work.

## 2013-03-04 ENCOUNTER — Other Ambulatory Visit: Payer: Self-pay | Admitting: Cardiovascular Disease

## 2013-03-06 ENCOUNTER — Other Ambulatory Visit: Payer: Medicare Other

## 2013-03-06 NOTE — Addendum Note (Signed)
Addended by: Thompson Grayer on: 03/06/2013 10:44 AM   Modules accepted: Orders

## 2013-03-07 ENCOUNTER — Ambulatory Visit (HOSPITAL_COMMUNITY)
Admission: RE | Admit: 2013-03-07 | Discharge: 2013-03-07 | Disposition: A | Discharge status: Home / Self Care | Payer: Medicare Other | Source: Ambulatory Visit | Attending: Cardiovascular Disease | Admitting: Cardiovascular Disease

## 2013-03-07 ENCOUNTER — Encounter (HOSPITAL_COMMUNITY)
Admission: RE | Disposition: A | Discharge status: Home / Self Care | Payer: Self-pay | Source: Ambulatory Visit | Attending: Cardiovascular Disease

## 2013-03-07 DIAGNOSIS — I4891 Unspecified atrial fibrillation: Secondary | ICD-10-CM | POA: Insufficient documentation

## 2013-03-07 DIAGNOSIS — I5032 Chronic diastolic (congestive) heart failure: Secondary | ICD-10-CM | POA: Insufficient documentation

## 2013-03-07 DIAGNOSIS — E785 Hyperlipidemia, unspecified: Secondary | ICD-10-CM | POA: Insufficient documentation

## 2013-03-07 DIAGNOSIS — I110 Hypertensive heart disease with heart failure: Secondary | ICD-10-CM | POA: Insufficient documentation

## 2013-03-07 DIAGNOSIS — I2510 Atherosclerotic heart disease of native coronary artery without angina pectoris: Secondary | ICD-10-CM | POA: Insufficient documentation

## 2013-03-07 DIAGNOSIS — K5730 Diverticulosis of large intestine without perforation or abscess without bleeding: Secondary | ICD-10-CM | POA: Insufficient documentation

## 2013-03-07 DIAGNOSIS — Z860100 Personal history of colon polyps, unspecified: Secondary | ICD-10-CM | POA: Insufficient documentation

## 2013-03-07 DIAGNOSIS — I509 Heart failure, unspecified: Secondary | ICD-10-CM | POA: Insufficient documentation

## 2013-03-07 DIAGNOSIS — I10 Essential (primary) hypertension: Secondary | ICD-10-CM

## 2013-03-07 DIAGNOSIS — Z87891 Personal history of nicotine dependence: Secondary | ICD-10-CM | POA: Insufficient documentation

## 2013-03-07 DIAGNOSIS — F329 Major depressive disorder, single episode, unspecified: Secondary | ICD-10-CM | POA: Insufficient documentation

## 2013-03-07 DIAGNOSIS — F3289 Other specified depressive episodes: Secondary | ICD-10-CM | POA: Insufficient documentation

## 2013-03-07 DIAGNOSIS — I43 Cardiomyopathy in diseases classified elsewhere: Secondary | ICD-10-CM | POA: Insufficient documentation

## 2013-03-07 DIAGNOSIS — G4733 Obstructive sleep apnea (adult) (pediatric): Secondary | ICD-10-CM | POA: Insufficient documentation

## 2013-03-07 DIAGNOSIS — Z7982 Long term (current) use of aspirin: Secondary | ICD-10-CM | POA: Insufficient documentation

## 2013-03-07 DIAGNOSIS — M1990 Unspecified osteoarthritis, unspecified site: Secondary | ICD-10-CM | POA: Insufficient documentation

## 2013-03-07 DIAGNOSIS — E119 Type 2 diabetes mellitus without complications: Secondary | ICD-10-CM | POA: Insufficient documentation

## 2013-03-07 HISTORY — PX: LEFT HEART CATHETERIZATION WITH CORONARY ANGIOGRAM: SHX5451

## 2013-03-07 LAB — CBC
HCT: 40.4 % (ref 39.0–52.0)
HEMOGLOBIN: 14 g/dL (ref 13.0–17.0)
MCH: 31.7 pg (ref 26.0–34.0)
MCHC: 34.7 g/dL (ref 30.0–36.0)
MCV: 91.6 fL (ref 78.0–100.0)
Platelets: 191 10*3/uL (ref 150–400)
RBC: 4.41 MIL/uL (ref 4.22–5.81)
RDW: 13.8 % (ref 11.5–15.5)
WBC: 5.9 10*3/uL (ref 4.0–10.5)

## 2013-03-07 LAB — BASIC METABOLIC PANEL
BUN: 22 mg/dL (ref 6–23)
CALCIUM: 9.7 mg/dL (ref 8.4–10.5)
CO2: 25 mEq/L (ref 19–32)
CREATININE: 0.89 mg/dL (ref 0.50–1.35)
Chloride: 105 mEq/L (ref 96–112)
GFR calc non Af Amer: 83 mL/min — ABNORMAL LOW (ref >90)
Glucose, Bld: 195 mg/dL — ABNORMAL HIGH (ref 70–99)
Potassium: 4.2 mEq/L (ref 3.7–5.3)
SODIUM: 144 meq/L (ref 137–147)

## 2013-03-07 LAB — PROTIME-INR
INR: 1.17 (ref 0.00–1.49)
PROTHROMBIN TIME: 14.7 s (ref 11.6–15.2)

## 2013-03-07 LAB — POCT ACTIVATED CLOTTING TIME
ACTIVATED CLOTTING TIME: 182 s
ACTIVATED CLOTTING TIME: 193 s
Activated Clotting Time: 177 seconds

## 2013-03-07 LAB — GLUCOSE, CAPILLARY: Glucose-Capillary: 168 mg/dL — ABNORMAL HIGH (ref 70–99)

## 2013-03-07 SURGERY — LEFT HEART CATHETERIZATION WITH CORONARY ANGIOGRAM
Anesthesia: LOCAL

## 2013-03-07 MED ORDER — SODIUM CHLORIDE 0.9 % IV SOLN: INTRAVENOUS | Status: AC

## 2013-03-07 MED ORDER — LIDOCAINE HCL (PF) 1 % IJ SOLN
INTRAMUSCULAR | Status: AC
  Filled 2013-03-07: qty 30

## 2013-03-07 MED ORDER — ASPIRIN 81 MG PO CHEW
81.0000 mg | CHEWABLE_TABLET | ORAL | Status: AC
  Administered 2013-03-07: 81 mg via ORAL

## 2013-03-07 MED ORDER — HEPARIN SODIUM (PORCINE) 1000 UNIT/ML IJ SOLN
INTRAMUSCULAR | Status: AC
  Filled 2013-03-07: qty 1

## 2013-03-07 MED ORDER — ASPIRIN 81 MG PO CHEW
CHEWABLE_TABLET | ORAL | Status: AC
  Filled 2013-03-07: qty 1

## 2013-03-07 MED ORDER — DIAZEPAM 5 MG PO TABS
ORAL_TABLET | ORAL | Status: AC
  Filled 2013-03-07: qty 1

## 2013-03-07 MED ORDER — SODIUM CHLORIDE 0.9 % IJ SOLN: 3.0000 mL | Freq: Two times a day (BID) | INTRAMUSCULAR | Status: DC

## 2013-03-07 MED ORDER — FENTANYL CITRATE 0.05 MG/ML IJ SOLN
INTRAMUSCULAR | Status: AC
  Filled 2013-03-07: qty 2

## 2013-03-07 MED ORDER — ADENOSINE 12 MG/4ML IV SOLN
16.0000 mL | Freq: Once | INTRAVENOUS | Status: DC
  Filled 2013-03-07: qty 16

## 2013-03-07 MED ORDER — SODIUM CHLORIDE 0.9 % IV SOLN
INTRAVENOUS | Status: DC
  Administered 2013-03-07 (×2): via INTRAVENOUS

## 2013-03-07 MED ORDER — VERAPAMIL HCL 2.5 MG/ML IV SOLN
INTRAVENOUS | Status: AC
  Filled 2013-03-07: qty 2

## 2013-03-07 MED ORDER — MIDAZOLAM HCL 2 MG/2ML IJ SOLN
INTRAMUSCULAR | Status: AC
  Filled 2013-03-07: qty 2

## 2013-03-07 MED ORDER — HEPARIN (PORCINE) IN NACL 2-0.9 UNIT/ML-% IJ SOLN
INTRAMUSCULAR | Status: AC
  Filled 2013-03-07: qty 1000

## 2013-03-07 MED ORDER — NITROGLYCERIN 0.2 MG/ML ON CALL CATH LAB
INTRAVENOUS | Status: AC
  Filled 2013-03-07: qty 1

## 2013-03-07 MED ORDER — SODIUM CHLORIDE 0.9 % IJ SOLN: 3.0000 mL | INTRAMUSCULAR | Status: DC | PRN

## 2013-03-07 MED ORDER — SODIUM CHLORIDE 0.9 % IV SOLN: 250.0000 mL | INTRAVENOUS | Status: DC | PRN

## 2013-03-07 MED ORDER — DIAZEPAM 5 MG PO TABS
5.0000 mg | ORAL_TABLET | ORAL | Status: AC
  Administered 2013-03-07: 5 mg via ORAL

## 2013-03-07 NOTE — CV Procedure (Signed)
Cardiac Catheterization Operative Report  KACEE LUNDAY [id number] 3/3/20158:57 AM MCKEOWN,WILLIAM DAVID, MD  Procedure Performed:  1. Left Heart Catheterization 2. Selective Coronary Angiography 3. Left ventricular angiogram 4. Fractional flow reserve of the LAD  Operator: Lauree Chandler, MD  Arterial access site:  Right radial artery.   Indication: 73 yo male with history of atrial fibrillation, hypertension with hypertensive cardiomyopathy and chronic diastolic congestive heart failure, type 2 diabetes mellitus, hyperlipidemia, and remote history of tobacco use with plans for MAZE procedure per Dr. Roxy Manns. Cardiac cath today to exclude obstructive CAD.                                      Procedure Details: The risks, benefits, complications, treatment options, and expected outcomes were discussed with the patient. The patient and/or family concurred with the proposed plan, giving informed consent. The patient was brought to the cath lab after IV hydration was begun and oral premedication was given. The patient was further sedated with Versed and Fentanyl. The right wrist was assessed with an Allens test which was positive. The right wrist was prepped and draped in a sterile fashion. 1% lidocaine was used for local anesthesia. Using the modified Seldinger access technique, a 5 French sheath was placed in the right radial artery. 3 mg Verapamil was given through the sheath. 5000 units IV heparin was given. Standard diagnostic catheters were used to perform selective coronary angiography. A pigtail catheter was used to perform a left ventricular angiogram. He was found to have stenosis involving the distal left main with severe stenosis ostial and Circumflex and moderate stenosis ostial LAD. I elected to perform FFR of the LAD.   The patient was given a total of 10,000 units IV heparin. ACT was over 195 and the left main was engaged with a XB LAD 3.5 guiding catheter. I then passed  a Volcano pressure wire into the LAD. Baseline FFR was 0.91-0.93. With infusion of IV adenosine for 2 minutes, FFR was 0.93 suggesting the ostial LAD stenosis was not severe.   The sheath was removed from the right radial artery and a Terumo hemostasis band was applied at the arteriotomy site on the right wrist. There were no immediate complications. The patient was taken to the recovery area in stable condition.   Hemodynamic Findings: Central aortic pressure: 110/61 Left ventricular pressure: 106/9/20  Angiographic Findings:  Left main: 50% distal stenosis.   Left Anterior Descending Artery: Large caliber vessel that does not reach the apex. The ostium of the vessel has a hazy, calcified 50% stenosis. This is best seen in the LAD/caudal views. The mid and distal vessel has no obstructive disease. There are two moderate caliber diagonal branches. The first diagonal branch has ostial 70% stenosis. The second diagonal branch appears to have at least 50% ostial stenosis.   Circumflex Artery: Moderate caliber vessel with intermediate branch and several very small caliber obtuse marginal branches. The ostium of the circumflex has a hazy 95% stenosis. The ostium of the intermediate branch has 95% stenosis.   Right Coronary Artery: Very large caliber dominant vessel with mild diffuse disease.   Left Ventricular Angiogram: LVEF=65-70%.   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 FFR of 0.93 in the LAD suggesting flow into the LAD is reduced but not in a severe range at this time.  2. Mild non-obstructive disease in the RCA 3. Normal LV systolic function 4. Atrial fibrillation  Recommendations: The stenosis in the distal left main involving the LAD and Circumflex/intermediate appears to be significant. Although the FFR of the LAD is 0.93, the stenosis involving the distal left main is concerning. He will need  consideration for CABG at the time of his Maze procedure.        Complications:  None. The patient tolerated the procedure well.

## 2013-03-07 NOTE — H&P (View-Only) (Signed)
Primary Care Physician: Alesia Richards, MD Referring Physician:  Dr Park Meo [name] is a 73 y.o. male with a h/o persistent atrial fibrillation who presents for EP consultation.  He reports that he was initially diagnosed with atrial fibrillation more than 10 years ago.  He was cardioverted several times by Dr Doreatha Lew.  He was evaluated by Dr Rolland Porter who recommended tikosyn and follow-up for ablation.  He did not return for follow-up but did very well on tikosyn for 8-9 years.  He reports that around October of 2014 his atrial arrhythmias progressed.  He has been in afib most of the time since October.  During atrial fibrillation, he reports symptoms of shortness of breath and decreased exercise tolerance.  He has occasional palpitations.  He has not tried other AADs.  Today, he denies symptoms of chest pain, orthopnea, PND, lower extremity edema, dizziness, presyncope, syncope, or neurologic sequela. The patient is tolerating medications without difficulties and is otherwise without complaint today.   Past Medical History  Diagnosis Date  . Hx of adenomatous colonic polyps   . Diabetes mellitus type II   . Hypertension   . Diverticulosis 2001  . Hyperlipidemia   . Persistent atrial fibrillation   . DJD (degenerative joint disease)   . Depression   . Obstructive sleep apnea     compliant with CPAP   Past Surgical History  Procedure Laterality Date  . Polinydal cyst      Removed  . Great toe arthrodesis, interphalangeal joint      Right foot  . Retina repair-right    . Cataract extraction      bilateral  . Basal cell carcinoma excision      x3 on face  . Polypectomy    . Cardiac catheterization      myocardial bridge but no cad    Current Outpatient Prescriptions  Medication Sig Dispense Refill  . acetaminophen (TYLENOL) 500 MG tablet Take 1,000 mg by mouth every 6 (six) hours as needed.       . ALPRAZolam (XANAX) 0.25 MG tablet Take 0.25 mg to 1 mg as needed       . amLODipine (NORVASC) 10 MG tablet Take 10 mg by mouth 2 (two) times daily.      [name] aspirin 325 MG EC tablet Take 8 tablets daily. Pt sometimes takes 10-12 tablets a day (01/16/13)      . atorvastatin (LIPITOR) 20 MG tablet Take 1 tablet 3 times a week      . BESIVANCE 0.6 % SUSP Use in left eye 4 times a day after each injection      . Cholecalciferol (VITAMIN D) 2000 UNITS CAPS Take 3 capsules by mouth daily.        [name] CINNAMON PO Take 2,000 mg by mouth daily.      [name] CLONIDINE HCL PO Take 0.2 mg by mouth 3 (three) times daily.       . diphenhydrAMINE (BENADRYL) 25 MG tablet Take 25 mg by mouth every 6 (six) hours as needed.        [name] glimepiride (AMARYL) 2 MG tablet Take 2 mg by mouth daily before breakfast.        . HYDROcodone-acetaminophen (VICODIN) 5-500 MG per tablet Take 1 tablet by mouth every 6 (six) hours as needed.        . metFORMIN (GLUCOPHAGE-XR) 500 MG 24 hr tablet Take 500 mg by mouth 2 (two) times daily.      . minoxidil (  LONITEN) 10 MG tablet Take 2 tabs QHS  60 tablet  11  . nitroGLYCERIN (NITROSTAT) 0.4 MG SL tablet Place 1 tablet (0.4 mg total) under the tongue every 5 (five) minutes as needed.  25 tablet  3  . potassium chloride SA (K-DUR,KLOR-CON) 20 MEQ tablet TAKE 1 TABLET (20 MEQ TOTAL) BY MOUTH 3 (THREE) TIMES DAILY.  270 tablet  4  . ranitidine (ZANTAC) 150 MG tablet 1 tab by mouth as needed.      . sertraline (ZOLOFT) 50 MG tablet Take 50 mg by mouth daily.      [name] triamterene-hydrochlorothiazide (MAXZIDE-25) 37.5-25 MG per tablet Take 1 tablet by mouth daily.  30 tablet  12  . valsartan (DIOVAN) 320 MG tablet TAKE 1 TABLET (320 MG TOTAL) BY MOUTH DAILY.  30 tablet  3  . Vitamins-Lipotropics (SUPER STRESS B-COMPLEX CR PO) Take 1 capsule by mouth daily.        No current facility-administered medications for this visit.    Allergies  Allergen Reactions  . Horse-Derived Products   . Other     Tetanus Shot  . Sunflower Seed [Sunflower Oil]     History    Social History  . Marital Status: Married    Spouse Name: N/A    Number of Children: 1  . Years of Education: N/A   Occupational History  . retired Software engineer    Social History Main Topics  . Smoking status: Former Smoker -- 4.00 packs/day    Types: Cigarettes    Quit date: 01/05/1981  . Smokeless tobacco: Never Used     Comment: Stopped 1983  . Alcohol Use: Yes     Comment: 1-5 drinks per week  . Drug Use: No  . Sexual Activity: Not on file   Other Topics Concern  . Not on file   Social History Narrative   Daily caffeine-yes   Patient gets regular exercise.   Pt lives in [place] with spouse.  Retired Software engineer.  Family History  Problem Relation Age of Onset  . Colon cancer Mother     Family History/Uncle   . Colon polyps Mother     Family History  . Atrial fibrillation Mother   . Hypertension  Mother   . Colon polyps Sister     Family history  . Diabetes Maternal Uncle   . Stroke Paternal Uncle   . Dementia Father     ROS- All systems are reviewed and negative except as per the HPI above  Physical Exam: Filed Vitals:   02/27/13 0826  BP: 142/82  Pulse: 67  Height: 5\' 11"  (1.803 m)  Weight: 208 lb (94.348 kg)    GEN- The patient is well appearing, alert and oriented x 3 today.   Head- normocephalic, atraumatic Eyes-  Sclera clear, conjunctiva pink Ears- hearing intact Oropharynx- clear Neck- supple, no JVP Lymph- no cervical lymphadenopathy Lungs- Clear to ausculation bilaterally, normal work of breathing Heart- irregular rate and rhythm, no murmurs, rubs or gallops, PMI not laterally displaced GI- soft, NT, ND, + BS Extremities- no clubbing, cyanosis, or edema MS- no significant deformity or atrophy Skin- no rash or lesion Psych- euthymic mood, full affect Neuro- strength and sensation are intact  EKG today reveals afib, V rate 67 bpm, septal infarct, nonspecific ST/ T changes Echo is reviewed Epic records including Dr Lanny Hurst notes are reviewed  Assessment and Plan:   1. persistent afib The patient has a h/o progressive afib.  He has failed medical therapy with tikosyn.  He takes very high doses of aspirin daily for his arthritis and is not willing to considered alternatives.  This significantly limits our ability to anticoagulate the patient.  His chads2vasc score is at least three and he should be anticoagulated long term.  He is clearly symptomatic with his afib despite adequate rate control. Therapeutic strategies for afib including medicine and ablation were discussed in detail with the patient today. His anticipated success rates with ablation are significantly reduced (on the order of 60%).  Amiodarone was discussed as an alternative.  He is aware that either approach would require anticoagulation.  We also had a discussion today about surgical  alternatives.  I think that he might be best served with a surgical mini-MAZE and left atrial appendage clip.  This would certainly offer an alternative to anticoagulation long term. He is interested in surgical maze and LAA ligation.  I will therefore refer the patient to Dr Roxy Manns for further assessment.     2. HTN Stable No change required today   [Addendum: I have been asked by Dr. Roxy Manns to perform a cardiac catheterization to exclude progression of CAD prior to MAZE procedure. Addendum to this note in order to place hospital orders for cath.  Darlina Guys, MD at 4:56pm on 03/03/13]

## 2013-03-07 NOTE — Discharge Instructions (Signed)

## 2013-03-07 NOTE — Interval H&P Note (Signed)
History and Physical Interval Note:  03/07/2013 7:38 AM  [name]  has presented today for cardiac cath with the diagnosis of afib, need for MAZE.   The various methods of treatment have been discussed with the patient and family. After consideration of risks, benefits and other options for treatment, the patient has consented to  Procedure(s): LEFT HEART CATHETERIZATION WITH CORONARY ANGIOGRAM (N/A) as a surgical intervention .  The patient's history has been reviewed, patient examined, no change in status, stable for surgery.  I have reviewed the patient's chart and labs.  Questions were answered to the patient's satisfaction.    Cath Lab Visit (complete for each Cath Lab visit)  Clinical Evaluation Leading to the Procedure:   ACS: no  Non-ACS:    Anginal Classification: No Symptoms  Anti-ischemic medical therapy: Minimal Therapy (1 class of medications)  Non-Invasive Test Results: No non-invasive testing performed  Prior CABG: No previous CABG        MCALHANY,CHRISTOPHER

## 2013-03-09 ENCOUNTER — Ambulatory Visit
Admission: RE | Admit: 2013-03-09 | Discharge: 2013-03-09 | Disposition: A | Discharge status: Home / Self Care | Payer: Medicare Other | Source: Ambulatory Visit | Attending: Thoracic Surgery (Cardiothoracic Vascular Surgery) | Admitting: Thoracic Surgery (Cardiothoracic Vascular Surgery)

## 2013-03-09 ENCOUNTER — Encounter (INDEPENDENT_AMBULATORY_CARE_PROVIDER_SITE_OTHER): Payer: Medicare Other | Admitting: Ophthalmology

## 2013-03-09 DIAGNOSIS — H33009 Unspecified retinal detachment with retinal break, unspecified eye: Secondary | ICD-10-CM

## 2013-03-09 DIAGNOSIS — I7100 Dissection of unspecified site of aorta: Secondary | ICD-10-CM

## 2013-03-09 DIAGNOSIS — H348392 Tributary (branch) retinal vein occlusion, unspecified eye, stable: Secondary | ICD-10-CM

## 2013-03-09 DIAGNOSIS — E1165 Type 2 diabetes mellitus with hyperglycemia: Secondary | ICD-10-CM

## 2013-03-09 DIAGNOSIS — I10 Essential (primary) hypertension: Secondary | ICD-10-CM

## 2013-03-09 DIAGNOSIS — H35039 Hypertensive retinopathy, unspecified eye: Secondary | ICD-10-CM

## 2013-03-09 DIAGNOSIS — E1139 Type 2 diabetes mellitus with other diabetic ophthalmic complication: Secondary | ICD-10-CM

## 2013-03-09 DIAGNOSIS — H43819 Vitreous degeneration, unspecified eye: Secondary | ICD-10-CM

## 2013-03-09 DIAGNOSIS — I4891 Unspecified atrial fibrillation: Secondary | ICD-10-CM

## 2013-03-09 DIAGNOSIS — E11319 Type 2 diabetes mellitus with unspecified diabetic retinopathy without macular edema: Secondary | ICD-10-CM

## 2013-03-09 MED ORDER — IOHEXOL 350 MG/ML SOLN
80.0000 mL | Freq: Once | INTRAVENOUS | Status: AC | PRN
  Administered 2013-03-09: 80 mL via INTRAVENOUS

## 2013-03-10 ENCOUNTER — Ambulatory Visit (INDEPENDENT_AMBULATORY_CARE_PROVIDER_SITE_OTHER): Payer: Medicare Other | Admitting: *Deleted

## 2013-03-10 DIAGNOSIS — Z79899 Other long term (current) drug therapy: Secondary | ICD-10-CM

## 2013-03-10 LAB — BASIC METABOLIC PANEL WITH GFR
BUN: 19 mg/dL (ref 6–23)
CO2: 25 mEq/L (ref 19–32)
CREATININE: 1 mg/dL (ref 0.50–1.35)
Calcium: 9.7 mg/dL (ref 8.4–10.5)
Chloride: 103 mEq/L (ref 96–112)
GFR, EST NON AFRICAN AMERICAN: 74 mL/min
GFR, Est African American: 86 mL/min
Glucose, Bld: 189 mg/dL — ABNORMAL HIGH (ref 70–99)
Potassium: 4.1 mEq/L (ref 3.5–5.3)
Sodium: 143 mEq/L (ref 135–145)

## 2013-03-10 NOTE — Progress Notes (Signed)
Patient ID: [name], male   DOB: 1940/07/15, 73 y.o.   MRN: [id number] Patient presents for NV to recheck BMET after recent CT scan with taking Meformin Rx.  Per Dr. Idell Pickles orders BMET ordered STAT.

## 2013-03-10 NOTE — Addendum Note (Signed)
Addended by: Makya Yurko A on: 03/10/2013 10:50 AM   Modules accepted: Orders

## 2013-03-13 ENCOUNTER — Ambulatory Visit (INDEPENDENT_AMBULATORY_CARE_PROVIDER_SITE_OTHER): Payer: Medicare Other | Admitting: Thoracic Surgery (Cardiothoracic Vascular Surgery)

## 2013-03-13 ENCOUNTER — Encounter: Payer: Self-pay | Admitting: Thoracic Surgery (Cardiothoracic Vascular Surgery)

## 2013-03-13 VITALS — BP 164/88 | HR 62 | Resp 20 | Ht 71.0 in | Wt 201.0 lb

## 2013-03-13 DIAGNOSIS — I4891 Unspecified atrial fibrillation: Secondary | ICD-10-CM

## 2013-03-13 DIAGNOSIS — I4819 Other persistent atrial fibrillation: Secondary | ICD-10-CM

## 2013-03-13 DIAGNOSIS — I5032 Chronic diastolic (congestive) heart failure: Secondary | ICD-10-CM

## 2013-03-13 DIAGNOSIS — I509 Heart failure, unspecified: Secondary | ICD-10-CM

## 2013-03-13 DIAGNOSIS — I2510 Atherosclerotic heart disease of native coronary artery without angina pectoris: Secondary | ICD-10-CM

## 2013-03-13 MED ORDER — AMIODARONE HCL 200 MG PO TABS: 200.0000 mg | ORAL_TABLET | Freq: Two times a day (BID) | ORAL | Status: DC

## 2013-03-13 NOTE — Patient Instructions (Signed)
The patient has been instructed to take aspirin 325 mg daily.  The patient has been instructed to begin taking amiodarone 200 mg by mouth twice daily 7 days prior to surgery.

## 2013-03-13 NOTE — Progress Notes (Addendum)
[address]       [place] [address]             [phone]     CARDIOTHORACIC SURGERY OFFICE NOTE  Referring Provider is Thompson Grayer, MD PCP is Alesia Richards, MD   HPI:  Patient returns for followup of long-standing recurrent persistent atrial fibrillation. He was originally seen in consultation on 03/01/2013. Since then he underwent diagnostic cardiac catheterization which revealed left main disease and severe two-vessel coronary artery disease involving high-grade ostial stenosis of the left circumflex coronary artery, a large ramus intermediate branch, and ostial stenosis of the left anterior descending coronary artery.  He returns for followup today to discuss treatment options further. He reports no new problems or complaints over the last 2 weeks. He specifically denies any history of chest discomfort with activity or at rest.   Current Outpatient Prescriptions  Medication Sig Dispense Refill  . acetaminophen (TYLENOL) 500 MG tablet Take 1,000 mg by mouth every 6 (six) hours as needed for mild pain.       [name] ALPRAZolam (XANAX) 0.25 MG tablet Take 0.25-1 mg by mouth at bedtime as needed for sleep.       [name] amLODipine (NORVASC) 10 MG tablet TAKE 1 TABLET (10 MG TOTAL) BY MOUTH 2 (TWO) TIMES DAILY.  180 tablet  0  . atorvastatin (LIPITOR) 20 MG tablet Take 1 tablet 3 times a week      . BESIVANCE 0.6 % SUSP Place 1 drop into the left eye 4 (four) times daily as needed (after injection). Use in left eye 4 times a day after each injection      . Cholecalciferol (VITAMIN D) 2000 UNITS CAPS Take 3 capsules by mouth daily.        [name] CINNAMON PO Take 2,000 mg by mouth daily.      [name] CLONIDINE HCL PO Take 0.2 mg by mouth 3 (three) times daily.       . diphenhydrAMINE (BENADRYL) 25 MG tablet Take 25 mg by mouth every 6 (six) hours as needed for allergies.       [name] glimepiride (AMARYL) 2 MG tablet Take 2 mg by mouth daily before breakfast.        .  HYDROcodone-acetaminophen (VICODIN) 5-500 MG per tablet Take 1 tablet by mouth every 6 (six) hours as needed for pain.       . metFORMIN (GLUCOPHAGE-XR) 500 MG 24 hr tablet Take 1,000 mg by mouth 2 (two) times daily.       . minoxidil (LONITEN) 10 MG tablet Take 2 tabs QHS  60 tablet  11  . Multiple Vitamins-Minerals (PRESERVISION AREDS PO) Take 1 capsule by mouth 2 (two) times daily.      . nitroGLYCERIN (NITROSTAT) 0.4 MG SL tablet Place 1 tablet (0.4 mg total) under the tongue every 5 (five) minutes as needed.  25 tablet  3  . potassium chloride SA (K-DUR,KLOR-CON) 20 MEQ tablet Take 20 mEq by mouth 3 (three) times daily.      . ranitidine (ZANTAC) 150 MG tablet Take 150 mg by mouth daily as needed for heartburn.       . sertraline (ZOLOFT) 100 MG tablet Take 100 mg by mouth daily.      [name] triamterene-hydrochlorothiazide (MAXZIDE-25) 37.5-25 MG per tablet Take 1 tablet by mouth daily.  30 tablet  12  . valsartan (DIOVAN) 320 MG tablet Take 320 mg by mouth daily.      [name]  Vitamins-Lipotropics (SUPER STRESS B-COMPLEX CR PO) Take 1 capsule by mouth daily.        No current facility-administered medications for this visit.      Physical Exam:   BP 164/88  Pulse 62  Resp 20  Ht 5\' 11"  (1.803 m)  Wt 201 lb (91.173 kg)  BMI 28.05 kg/m2  SpO2 97%  General:  Well-appearing  Chest:   Clear to auscultation  CV:   Irregular rate and rhythm without murmur  Incisions:  n/a  Abdomen:  Soft and nontender  Extremities:  Warm and well-perfused   Diagnostic Tests:  Cardiac Catheterization Operative Report  [name]  [id number]  3/3/20158:57 AM  MCKEOWN,WILLIAM DAVID, MD  Procedure Performed:  1. Left Heart Catheterization 2. Selective Coronary Angiography 3. Left ventricular angiogram 4. Fractional flow reserve of the LAD Operator: Lauree Chandler, MD  Arterial access site: Right radial artery.  Indication: 73 yo male with history of atrial fibrillation, hypertension with  hypertensive cardiomyopathy and chronic diastolic congestive heart failure, type 2 diabetes mellitus, hyperlipidemia, and remote history of tobacco use with plans for MAZE procedure per Dr. Roxy Manns. Cardiac cath today to exclude obstructive CAD.  Procedure Details:  The risks, benefits, complications, treatment options, and expected outcomes were discussed with the patient. The patient and/or family concurred with the proposed plan, giving informed consent. The patient was brought to the cath lab after IV hydration was begun and oral premedication was given. The patient was further sedated with Versed and Fentanyl. The right wrist was assessed with an Allens test which was positive. The right wrist was prepped and draped in a sterile fashion. 1% lidocaine was used for local anesthesia. Using the modified Seldinger access technique, a 5 French sheath was placed in the right radial artery. 3 mg Verapamil was given through the sheath. 5000 units IV heparin was given. Standard diagnostic catheters were used to perform selective coronary angiography. A pigtail catheter was used to perform a left ventricular angiogram. He was found to have stenosis involving the distal left main with severe stenosis ostial and Circumflex and moderate stenosis ostial LAD. I elected to perform FFR of the LAD.  The patient was given a total of 10,000 units IV heparin. ACT was over 195 and the left main was engaged with a XB LAD 3.5 guiding catheter. I then passed a Volcano pressure wire into the LAD. Baseline FFR was 0.91-0.93. With infusion of IV adenosine for 2 minutes, FFR was 0.93 suggesting the ostial LAD stenosis was not severe.  The sheath was removed from the right radial artery and a Terumo hemostasis band was applied at the arteriotomy site on the right wrist. There were no immediate complications. The patient was taken to the recovery area in stable condition.  Hemodynamic Findings:  Central aortic pressure: 110/61  Left  ventricular pressure: 106/9/20  Angiographic Findings:  Left main: 50% distal stenosis.  Left Anterior Descending Artery: Large caliber vessel that does not reach the apex. The ostium of the vessel has a hazy, calcified 50% stenosis. This is best seen in the LAD/caudal views. The mid and distal vessel has no obstructive disease. There are two moderate caliber diagonal branches. The first diagonal branch has ostial 70% stenosis. The second diagonal branch appears to have at least 50% ostial stenosis.  Circumflex Artery: Moderate caliber vessel with intermediate branch and several very small caliber obtuse marginal branches. The ostium of the circumflex has a hazy 95% stenosis. The ostium of the intermediate branch has  95% stenosis.  Right Coronary Artery: Very large caliber dominant vessel with mild diffuse disease.  Left Ventricular Angiogram: LVEF=65-70%.  Impression:  1. 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 FFR of 0.93 in the LAD suggesting flow into the LAD is reduced but not in a severe range at this time.  2. Mild non-obstructive disease in the RCA  3. Normal LV systolic function  4. Atrial fibrillation  Recommendations: The stenosis in the distal left main involving the LAD and Circumflex/intermediate appears to be significant. Although the FFR of the LAD is 0.93, the stenosis involving the distal left main is concerning. He will need consideration for CABG at the time of his Maze procedure.  Complications: None. The patient tolerated the procedure well.     CT ANGIOGRAPHY CHEST, ABDOMEN AND PELVIS  TECHNIQUE:  Multidetector CT imaging through the chest, abdomen and pelvis was  performed using the standard protocol during bolus administration of  intravenous contrast. Multiplanar reconstructed images including  MIPs were obtained and reviewed to evaluate the vascular anatomy.  CONTRAST: 55mL OMNIPAQUE IOHEXOL 350 MG/ML  SOLN  COMPARISON: None.  FINDINGS:  CTA CHEST FINDINGS  Atherosclerotic changes of the thoracic aorta. Initial noncontrast  phase demonstrates No hyperdense intramural hemorrhage within the  thoracic aorta. Postcontrast, the thoracic aorta is patent as well  as the major branch vessels. Negative for significant aneurysm or  dissection. No mediastinal hemorrhage or hematoma. Mild cardiac  enlargement. Coronary calcifications noted. No pericardial or  pleural effusion. Mildly prominent subcarinal lymph nodes and  prevascular lymph nodes. No significant adenopathy or pathologically  enlarged nodes appreciated.  Lung windows demonstrate clear lungs. No airspace process, collapse  or consolidation. No interstitial change or edema. Trachea and  central airways are patent. No suspicious pulmonary nodule or mass.  Review of the MIP images confirms the above findings.  CTA ABDOMEN AND PELVIS FINDINGS  Abdominal aortic atherosclerosis noted with mild tortuosity.  Negative for aneurysm, dissection or occlusion. No retroperitoneal  hemorrhage or hematoma. Celiac, SMA, main renal arteries, accessory  left lower pole renal artery, and IMA are all patent. Aorta  bifurcation is patent without occlusive disease.  Iliac atherosclerosis noted with mild tortuosity. Common, internal  and external iliac arteries remain patent throughout the pelvis.  Negative for iliac occlusion, dissection or aneurysm. Visualized  common femoral, proximal superficial femoral and proximal profunda  femoral branches are all patent in the inguinal regions.  Nonvascular: Liver, gallbladder, biliary system, pancreas, spleen,  right adrenal gland, and kidneys are stable and within normal limits  for age. Left adrenal gland demonstrates a stable hypodense 2.5 cm  nodule compatible with an adrenal adenoma. No interval change.  Negative bowel obstruction, dilatation, ileus, or free air. No  abdominal free fluid, fluid collection,  hemorrhage, abscess, or  adenopathy.  Appendix is unremarkable. Extensive sigmoid diverticulosis with  chronic wall thickening. No definite acute inflammatory process to  suggest diverticulitis. No pelvic free fluid, fluid collection,  hemorrhage, abscess, adenopathy, or significant inguinal  abnormality.  Degenerative changes of the spine and pelvis. No acute osseous  finding.  Review of the MIP images confirms the above findings.  IMPRESSION:  CTA CHEST IMPRESSION  Negative for thoracic aorta dissection. Thoracic aortic  atherosclerosis and coronary atherosclerosis.  No acute intra thoracic finding  CTA ABDOMEN AND PELVIS IMPRESSION  Negative for abdominal aortic or iliac dissection. Abdominal aortic  atherosclerosis diffusely.  Negative for mesenteric or renal vascular occlusive process.  No iliac occlusion or inflow disease.  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  On: 03/09/2013 16:35     Impression:  The patient has left main disease with two-vessel coronary artery disease and preserved left ventricular systolic function.  He also has long-standing recurrent persistent atrial fibrillation and has failed multiple cardioversions in the distant past as well as long-term treatment with Tikosyn. He has remained in persistent atrial fibrillation for several months and is symptomatic with significant exertional shortness of breath, New York Heart Association functional class II. Symptoms are likely further exacerbated by the presence of long-standing hypertension with hypertensive cardiomyopathy and significant diastolic dysfunction. He does not have significant valvular heart disease and there was no sign of significant dynamic left ventricular output tract obstruction on recent transthoracic echocardiogram. He does describe some atypical symptoms of left sided chest pain, but these are not related to  physical exertion.  He is otherwise relatively healthy and appears to be a good candidate for surgical intervention.  Given the findings at catheterization I feel strongly the patient should undergo coronary artery bypass grafting with concomitant Maze procedure.   Plan:  I have reviewed the indications, risks, and potential benefits of coronary artery bypass grafting and maze procedure with the patient and his wife.  Alternative treatment strategies have been discussed.  The patient understands and accepts all potential associated risks of surgery including but not limited to risk of death, stroke or other neurologic complication, myocardial infarction, congestive heart failure, respiratory failure, renal failure, bleeding requiring blood transfusion and/or reexploration, aortic dissection or other major vascular complication, arrhythmia, heart block or bradycardia requiring permanent pacemaker, pneumonia, pleural effusion, wound infection, pulmonary embolus or other thromboembolic complication, chronic pain or other delayed complications related to median sternotomy, or the late recurrence of symptomatic ischemic heart disease, atrial fibrillation/atrial flutter and/or congestive heart failure.  The importance of long term risk modification have been emphasized.  All questions answered.  For personal reasons the patient desires to hold off on surgery until after 04/01/2013. We tentatively plan to proceed with surgery on Wednesday, 04/05/2013. The patient will return for followup prior to surgery on Monday, 04/03/2013.  He has been given a prescription for amiodarone to begin one week prior to surgery.  I spent in excess of 30 minutes during the conduct of this office consultation and >50% of this time involved direct face-to-face encounter with the patient for counseling and/or coordination of their care.   Valentina Gu. Roxy Manns, MD 03/13/2013 5:36 PM

## 2013-03-14 ENCOUNTER — Other Ambulatory Visit: Payer: Self-pay | Admitting: *Deleted

## 2013-03-14 DIAGNOSIS — I2510 Atherosclerotic heart disease of native coronary artery without angina pectoris: Secondary | ICD-10-CM

## 2013-03-14 DIAGNOSIS — I4891 Unspecified atrial fibrillation: Secondary | ICD-10-CM

## 2013-03-27 ENCOUNTER — Encounter (HOSPITAL_COMMUNITY): Payer: Self-pay | Admitting: Pharmacy Technician

## 2013-04-02 NOTE — H&P (Signed)
[address]       ,[place] [address]             [phone]          CARDIOTHORACIC SURGERY HISTORY AND PHYSICAL EXAM  Referring Provider is Thompson Grayer, MD PCP is Alesia Richards, MD    Chief Complaint   Patient presents with   .  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 The patient first began to develop palpitations and chest pain nearly 20 years ago. Initially he was diagnosed with PVCs, and cardiac catheterization performed by Dr. Doreatha Lew revealed no significant coronary artery disease but the presence of a coronary myocardial bridge.  Approximately 12 years ago he first developed atrial fibrillation. He underwent DC cardioversion and maintained sinus rhythm for about a year or two, but he subsequently relapsed into atrial fibrillation. He underwent a total of 2 additional cardioversions, each with progressively shorter period of time where he remained in sinus rhythm.  Ultimately he was referred to Dr. Rolland Porter in [place] for possible ablation approximately 8 or 9 years ago. He was started on Tikosyn and did quite well, maintaining sinus rhythm for several years. Ablation was never performed.  More recently he has been followed by Dr. Acie Fredrickson.  He has always been reluctant to consider long-term anticoagulation with Coumadin. He was treated with Eliquis for a period of time, but he had problems with excessive bleeding from minor cuts and fingersticks for blood glucose monitoring, and he developed rectal bleeding after a colonoscopy.  He chronically takes fairly high dose aspirin for arthritis.  This past fall the  patient began to experience episodes of palpitations with worsening exertional shortness of breath. He has remained in persistent atrial fibrillation since that time. He was seen in followup by Dr. Acie Fredrickson and an echocardiogram was performed demonstrating normal left ventricular systolic function with severe left ventricular hypertrophy and significant diastolic dysfunction. There was no significant valvular disease and no sign of significant dynamic left ventricular output tract obstruction. He was referred to Dr. Rayann Heman to consider atrial fibrillation ablation. However, because of the size of his left atrium and his underlying history, Dr. Rayann Heman is concerned that the likely it likelihood of success with ablation may be relatively low.  Furthermore, with concerns regarding the need for long term anticoagulation the possibility of eliminating the left atrial appendage may be of significant benefit.  The patient was referred to consider surgical maze procedure and originally seen in consultation on 03/01/2013. Since then he underwent diagnostic cardiac catheterization which revealed left main disease and severe two-vessel coronary artery disease involving high-grade ostial stenosis of the left circumflex coronary artery, a large ramus intermediate branch, and ostial stenosis of the left anterior descending coronary artery.    The patient remains relatively active for a gentleman his age. He does have significant arthritis, but this does not seem to limit him to much.  He describes shortness of breath with moderately strenuous physical activity that has progressed over the last several months since he went back into atrial fibrillation. He does not get short of breath with ordinary physical activities, but he does complain that his  exertional shortness of breath bothers him to a significant degree.  He denies any history of resting shortness of breath, PND, orthopnea, dizzy spells, or syncope. He has occasional  atypical symptoms of chest pain that are not related to physical exertion.     Past Medical History  Diagnosis Date  . Hx of adenomatous colonic polyps   . Diabetes mellitus type II   . Hypertension   . Diverticulosis 2001  . Hyperlipidemia   . Persistent atrial fibrillation   . DJD (degenerative joint disease)   . Depression   . Obstructive sleep apnea     compliant with CPAP  . Chronic diastolic congestive heart failure   . Hypertensive cardiomyopathy   . Coronary-myocardial bridge   . 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 Coronary artery disease 03/07/2013    Past Surgical History  Procedure Laterality Date  . Polinydal cyst      Removed  . Great toe arthrodesis, interphalangeal joint      Right foot  . Retina repair-right    . Cataract extraction      bilateral  . Basal cell carcinoma excision      x3 on face  . Polypectomy    . Cardiac catheterization      myocardial bridge but no cad    Family History  Problem Relation Age of Onset  . Colon cancer Mother     Family History/Uncle   . Colon polyps Mother     Family History  . Atrial fibrillation Mother   . Hypertension Mother   . Colon polyps Sister     Family history  . Diabetes Maternal Uncle   . Stroke Paternal Uncle   . Dementia Father     Social History History  Substance Use Topics  . Smoking status: Former Smoker -- 4.00 packs/day    Types: Cigarettes    Quit date: 01/05/1981  . Smokeless tobacco: Never Used     Comment: Stopped 1983  . Alcohol Use: Yes     Comment: 1-5 drinks per week    Prior to Admission medications   Medication Sig Start Date End Date Taking? Authorizing Provider  acetaminophen (TYLENOL) 500 MG tablet Take 1,000 mg by mouth every 6 (six) hours as needed for mild pain.    Yes Historical Provider, MD  ALPRAZolam (XANAX) 0.25 MG tablet Take 0.25-1 mg by mouth at bedtime as needed for sleep.    Yes Historical Provider, MD  amLODipine (NORVASC) 10 MG tablet Take 10  mg by mouth 2 (two) times daily.   Yes Historical Provider, MD  atorvastatin (LIPITOR) 20 MG tablet Take 20 mg by mouth 3 (three) times a week. Monday Wednesday and Friday   Yes Historical Provider, MD  BESIVANCE 0.6 % SUSP Place 1 drop into the left eye 4 (four) times daily as needed (after injection). Use in left eye 4 times a day after each injection 11/10/12  Yes Historical Provider, MD  Cholecalciferol (VITAMIN D3) 5000 UNITS CAPS Take 5,000 Units by mouth daily.   Yes Historical Provider, MD  CINNAMON PO Take 2,000 mg by mouth daily.   Yes Historical Provider, MD  CLONIDINE HCL PO Take 0.2 mg by mouth 3 (three) times daily.    Yes Historical Provider, MD  diphenhydrAMINE (BENADRYL) 25 MG tablet Take 25 mg by mouth every 6 (six) hours as needed for allergies.    Yes Historical Provider, MD  glimepiride (AMARYL) 2 MG tablet Take 2 mg by mouth daily before  breakfast.     Yes Historical Provider, MD  HYDROcodone-acetaminophen (VICODIN) 5-500 MG per tablet Take 1 tablet by mouth every 6 (six) hours as needed for pain.    Yes Historical Provider, MD  metFORMIN (GLUCOPHAGE-XR) 500 MG 24 hr tablet Take 1,000 mg by mouth 2 (two) times daily.    Yes Historical Provider, MD  minoxidil (LONITEN) 10 MG tablet Take 20 mg by mouth at bedtime.   Yes Historical Provider, MD  Multiple Vitamins-Minerals (PRESERVISION AREDS PO) Take 1 capsule by mouth 2 (two) times daily.   Yes Historical Provider, MD  nitroGLYCERIN (NITROSTAT) 0.4 MG SL tablet Place 1 tablet (0.4 mg total) under the tongue every 5 (five) minutes as needed. 09/30/10  Yes Thayer Headings, MD  potassium chloride SA (K-DUR,KLOR-CON) 20 MEQ tablet Take 20 mEq by mouth 3 (three) times daily.   Yes Historical Provider, MD  ranitidine (ZANTAC) 150 MG tablet Take 150 mg by mouth daily as needed for heartburn.    Yes Historical Provider, MD  sertraline (ZOLOFT) 100 MG tablet Take 100 mg by mouth daily.   Yes Historical Provider, MD    triamterene-hydrochlorothiazide (MAXZIDE-25) 37.5-25 MG per tablet Take 1 tablet by mouth daily. 01/30/13  Yes Thayer Headings, MD  valsartan (DIOVAN) 320 MG tablet Take 320 mg by mouth daily.   Yes Historical Provider, MD  Vitamins-Lipotropics (SUPER STRESS B-COMPLEX CR PO) Take 1 capsule by mouth daily.    Yes Historical Provider, MD  amiodarone (PACERONE) 200 MG tablet Take 1 tablet (200 mg total) by mouth 2 (two) times daily. Begin 7 days prior to surgery. 03/13/13   Rexene Alberts, MD    Allergies  Allergen Reactions  . Horse-Derived Products Other (See Comments)    unknown  . Other Other (See Comments)    Tetanus Shot   (unknown reaction)  . Sunflower Seed [Sunflower Oil] Swelling      Review of Systems:              General:                      normal appetite,  normal energy, no weight gain, no weight loss, no fever             Cardiac:                      no chest pain with exertion, no chest pain at rest, + SOB with moderately strenous exertion, no resting SOB, no PND, no orthopnea, + palpitations, + arrhythmia, + atrial fibrillation, + mild LE edema, no dizzy spells, no syncope             Respiratory:                + exertional shortness of breath, no home oxygen, no productive cough, no dry cough, no bronchitis, no wheezing, no hemoptysis, no asthma, no pain with inspiration or cough, + sleep apnea, + CPAP at night             GI:                                no difficulty swallowing, + reflux, no frequent heartburn, + hiatal hernia, no abdominal pain, no constipation, no diarrhea, no hematochezia, no hematemesis, no melena             GU:  no dysuria,  no frequency, no urinary tract infection, no hematuria, no enlarged prostate, no kidney stones, no kidney disease             Vascular:                     no pain suggestive of claudication, no pain in feet, no leg cramps, no varicose veins, no DVT, no non-healing foot ulcer             Neuro:                          no stroke, no TIA's, no seizures, no headaches, no temporary blindness one eye,  no slurred speech, no peripheral neuropathy, no chronic pain, no instability of gait, no memory/cognitive dysfunction             Musculoskeletal:         + arthritis, no joint swelling, no myalgias, no difficulty walking, normal mobility               Skin:                            no rash, no itching, no skin infections, no pressure sores or ulcerations             Psych:                         no anxiety, + depression, no nervousness, no unusual recent stress             Eyes:                           + blurry vision, no floaters, + recent vision changes, + wears glasses for reading             ENT:                            no hearing loss, no loose or painful teeth, no dentures, last saw dentist 3 months ago             Hematologic:               no easy bruising, no abnormal bleeding, no clotting disorder, no frequent epistaxis             Endocrine:                   + diabetes, routinely checks CBG's at home                           Physical Exam:              BP 153/77  Pulse 73  Resp 18  Ht 5\' 11"  (1.803 m)  Wt 201 lb (91.173 kg)  BMI 28.05 kg/m2  SpO2 96%             General:                        well-appearing             HEENT:  Unremarkable               Neck:                           no JVD, no bruits, no adenopathy               Chest:                         clear to auscultation, symmetrical breath sounds, no wheezes, no rhonchi               CV:                              RRR, grade I-II systolic flow murmur best at LSB             Abdomen:                    soft, non-tender, no masses               Extremities:                 warm, well-perfused, pulses diminished but palpable, no LE edema             Rectal/GU                   Deferred             Neuro:                         Grossly non-focal and symmetrical throughout              Skin:                            Clean and dry, no rashes, no breakdown   Diagnostic Tests:  ECG    12 lead ECG erformed 02/27/2013 demonstrates atrial fibrillation   Echocardiography  Patient:    Johnaton, Ormond MR #:       [id number] Study Date: 01/17/2013 Gender:     M Age:        28 Height:     177.8cm Weight:     89.4kg BSA:        2.35m^2 Pt. Status: Room:    ATTENDING    Nahser, Aundria Mems     Nahser, Jasmine Awe    Nahser, Philip  SONOGRAPHER  Cindy Hazy, RDCS  PERFORMING   [facility], Outpatient cc:  ------------------------------------------------------------ LV EF: 75% -   80%  ------------------------------------------------------------ Indications:      Atrial fibrillation - 427.31.  ------------------------------------------------------------ History:   PMH:  Acquired from the patient and from the patient's chart.  PMH:  Aortic Insufficiency. CHF. Atrial Fibrillation. HOCM.  Risk factors:  Former tobacco use. Hypertension. Diabetes mellitus. Dyslipidemia.  ------------------------------------------------------------ Study Conclusions  - Left ventricle: Wall thickness was increased in a pattern   of moderate LVH. Systolic function was vigorous. The   estimated ejection fraction was in the range of 75% to   80%. Wall motion was normal; there were no regional wall   motion abnormalities. - Aortic valve: Trivial regurgitation. - Left atrium: The atrium was mildly to moderately dilated. - Right ventricle: The cavity size was mildly dilated.  Systolic function was mildly reduced. - Right atrium: The atrium was moderately dilated. - Pulmonary arteries: Systolic pressure was mildly   increased. PA peak pressure: 79mm Hg (S). Echocardiography.  M-mode, complete 2D, spectral Doppler, and color Doppler.  Height:  Height: 177.8cm. Height: 70in. Weight:  Weight: 89.4kg. Weight: 196.6lb.  Body mass index: BMI: 28.3kg/m^2.  Body surface area:    BSA:  2.33m^2.  Blood pressure:     148/68.  Patient status:  Outpatient. Location:  [facility]  ------------------------------------------------------------  ------------------------------------------------------------ Left ventricle:   Wall thickness was increased in a pattern of moderate LVH.   Systolic function was vigorous. The estimated ejection fraction was in the range of 75% to 80%. Wall motion was normal; there were no regional wall motion abnormalities.  ------------------------------------------------------------ Aortic valve:   Structurally normal valve.   Cusp separation was normal.  Doppler:  Transvalvular velocity was within the normal range. There was no stenosis.  Trivial regurgitation.   ------------------------------------------------------------ Aorta:  Ascending aorta: The ascending aorta was mildly dilated.  ------------------------------------------------------------ Mitral valve:   Structurally normal valve.   Leaflet separation was normal.  Doppler:  Transvalvular velocity was within the normal range. There was no evidence for stenosis.  Trivial regurgitation.    Peak gradient: 26mm Hg (D).  ------------------------------------------------------------ Left atrium:  The atrium was mildly to moderately dilated.   ------------------------------------------------------------ Right ventricle:  The cavity size was mildly dilated. Systolic function was mildly reduced.  ------------------------------------------------------------ Pulmonic valve:    Structurally normal valve.   Cusp separation was normal.  Doppler:  Transvalvular velocity was within the normal range.  Trivial regurgitation.  ------------------------------------------------------------ Tricuspid valve:   Structurally normal valve.   Leaflet separation was normal.  Doppler:  Transvalvular velocity was within the normal range.  Trivial  regurgitation.  ------------------------------------------------------------ Pulmonary artery:   Systolic pressure was mildly increased.   ------------------------------------------------------------ Right atrium:  The atrium was moderately dilated.  ------------------------------------------------------------ Pericardium:  There was no pericardial effusion.  ------------------------------------------------------------  2D measurements        Normal  Doppler measurements   Normal Left ventricle                 Main pulmonary LVID ED,   45.3 mm     43-52   artery chord,                         Pressure, S  42 mm Hg  =30 PLAX                           Left ventricle LVID ES,   23.6 mm     23-38   Ea, lat     17. cm/s   ------ chord,                         ann, tiss     9 PLAX                           DP FS, chord,   48 %      >29     E/Ea, lat   6.8        ------ PLAX                           ann, tiss  2 LVPW, ED   16.9 mm     ------  DP IVS/LVPW   1.25        <1.3    Ea, med     12. cm/s   ------ ratio, ED                      ann, tiss     5 Ventricular septum             DP IVS, ED    21.1 mm     ------  E/Ea, med   9.7        ------ LVOT                           ann, tiss     6 Diam, S      20 mm     ------  DP Area       3.14 cm^2   ------  LVOT Diam         20 mm     ------  Peak vel, S 151 cm/s   ------ Aorta                          VTI, S      29. cm     ------ Root diam,   43 mm     ------                3 ED                             Peak          9 mm Hg  ------ AAo AP       42 mm     ------  gradient, S diam, S                        Stroke vol   92 ml     ------ Left atrium                    Stroke      44. ml/m^2 ------ AP dim       47 mm     ------  index         5 AP dim     2.27 cm/m^2 <2.2    Mitral valve index                          Peak E vel  122 cm/s   ------                                Deceleratio 194 ms     150-23                                 n time                 0                                Peak  6 mm Hg  ------                                gradient, D                                Tricuspid valve                                Regurg peak 304 cm/s   ------                                vel                                Peak RV-RA   37 mm Hg  ------                                gradient, S                                Systemic veins                                Estimated     5 mm Hg  ------                                CVP                                Right ventricle                                Pressure, S  42 mm Hg  <30                                Sa vel, lat 25. cm/s   ------                                ann, tiss     3                                DP   ------------------------------------------------------------ Prepared and Electronically Authenticated by  Mertie Moores 2015-01-13T15:07:52.360    Cardiac Catheterization Operative Report   RONNELL HORNG   [id number]   3/3/20158:57 AM   MCKEOWN,WILLIAM DAVID, MD   Procedure Performed:  1. Left Heart Catheterization 2. Selective Coronary Angiography 3. Left ventricular angiogram 4. Fractional flow reserve of the LAD Operator: Lauree Chandler, MD  Arterial access site: Right radial artery.   Indication: 73 yo male with history of atrial fibrillation, hypertension with hypertensive cardiomyopathy and chronic diastolic congestive heart failure, type 2 diabetes mellitus, hyperlipidemia, and remote  history of tobacco use with plans for MAZE procedure per Dr. Roxy Manns. Cardiac cath today to exclude obstructive CAD.   Procedure Details:   The risks, benefits, complications, treatment options, and expected outcomes were discussed with the patient. The patient and/or family concurred with the proposed plan, giving informed consent. The patient was brought to the cath lab after IV hydration was begun and oral premedication was  given. The patient was further sedated with Versed and Fentanyl. The right wrist was assessed with an Allens test which was positive. The right wrist was prepped and draped in a sterile fashion. 1% lidocaine was used for local anesthesia. Using the modified Seldinger access technique, a 5 French sheath was placed in the right radial artery. 3 mg Verapamil was given through the sheath. 5000 units IV heparin was given. Standard diagnostic catheters were used to perform selective coronary angiography. A pigtail catheter was used to perform a left ventricular angiogram. He was found to have stenosis involving the distal left main with severe stenosis ostial and Circumflex and moderate stenosis ostial LAD. I elected to perform FFR of the LAD.   The patient was given a total of 10,000 units IV heparin. ACT was over 195 and the left main was engaged with a XB LAD 3.5 guiding catheter. I then passed a Volcano pressure wire into the LAD. Baseline FFR was 0.91-0.93. With infusion of IV adenosine for 2 minutes, FFR was 0.93 suggesting the ostial LAD stenosis was not severe.   The sheath was removed from the right radial artery and a Terumo hemostasis band was applied at the arteriotomy site on the right wrist. There were no immediate complications. The patient was taken to the recovery area in stable condition.   Hemodynamic Findings:   Central aortic pressure: 110/61   Left ventricular pressure: 106/9/20   Angiographic Findings:   Left main: 50% distal stenosis.   Left Anterior Descending Artery: Large caliber vessel that does not reach the apex. The ostium of the vessel has a hazy, calcified 50% stenosis. This is best seen in the LAD/caudal views. The mid and distal vessel has no obstructive disease. There are two moderate caliber diagonal branches. The first diagonal branch has ostial 70% stenosis. The second diagonal branch appears to have at least 50% ostial stenosis.   Circumflex Artery: Moderate caliber vessel  with intermediate branch and several very small caliber obtuse marginal branches. The ostium of the circumflex has a hazy 95% stenosis. The ostium of the intermediate branch has 95% stenosis.   Right Coronary Artery: Very large caliber dominant vessel with mild diffuse disease.   Left Ventricular Angiogram: LVEF=65-70%.   Impression:  1. 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 FFR of 0.93 in the LAD suggesting flow into the LAD is reduced but not in a severe range at this time.   2. Mild non-obstructive disease in the RCA   3. Normal LV systolic function   4. Atrial fibrillation   Recommendations: The stenosis in the distal left main involving the LAD and Circumflex/intermediate appears to be significant. Although the FFR of the LAD is 0.93, the stenosis involving the distal left main is concerning. He will need consideration for CABG at the time of his Maze procedure.   Complications: None. The patient tolerated the procedure well.     CT ANGIOGRAPHY CHEST, ABDOMEN AND PELVIS  TECHNIQUE:   Multidetector CT imaging through the chest, abdomen and pelvis was   performed  using the standard protocol during bolus administration of   intravenous contrast. Multiplanar reconstructed images including   MIPs were obtained and reviewed to evaluate the vascular anatomy.   CONTRAST: 54mL OMNIPAQUE IOHEXOL 350 MG/ML SOLN   COMPARISON: None.   FINDINGS:   CTA CHEST FINDINGS   Atherosclerotic changes of the thoracic aorta. Initial noncontrast   phase demonstrates No hyperdense intramural hemorrhage within the   thoracic aorta. Postcontrast, the thoracic aorta is patent as well   as the major branch vessels. Negative for significant aneurysm or   dissection. No mediastinal hemorrhage or hematoma. Mild cardiac   enlargement. Coronary calcifications noted. No pericardial or   pleural effusion. Mildly prominent subcarinal lymph nodes and    prevascular lymph nodes. No significant adenopathy or pathologically   enlarged nodes appreciated.   Lung windows demonstrate clear lungs. No airspace process, collapse   or consolidation. No interstitial change or edema. Trachea and   central airways are patent. No suspicious pulmonary nodule or mass.   Review of the MIP images confirms the above findings.   CTA ABDOMEN AND PELVIS FINDINGS   Abdominal aortic atherosclerosis noted with mild tortuosity.   Negative for aneurysm, dissection or occlusion. No retroperitoneal   hemorrhage or hematoma. Celiac, SMA, main renal arteries, accessory   left lower pole renal artery, and IMA are all patent. Aorta   bifurcation is patent without occlusive disease.   Iliac atherosclerosis noted with mild tortuosity. Common, internal   and external iliac arteries remain patent throughout the pelvis.   Negative for iliac occlusion, dissection or aneurysm. Visualized   common femoral, proximal superficial femoral and proximal profunda   femoral branches are all patent in the inguinal regions.   Nonvascular: Liver, gallbladder, biliary system, pancreas, spleen,   right adrenal gland, and kidneys are stable and within normal limits   for age. Left adrenal gland demonstrates a stable hypodense 2.5 cm   nodule compatible with an adrenal adenoma. No interval change.   Negative bowel obstruction, dilatation, ileus, or free air. No   abdominal free fluid, fluid collection, hemorrhage, abscess, or   adenopathy.   Appendix is unremarkable. Extensive sigmoid diverticulosis with   chronic wall thickening. No definite acute inflammatory process to   suggest diverticulitis. No pelvic free fluid, fluid collection,   hemorrhage, abscess, adenopathy, or significant inguinal   abnormality.   Degenerative changes of the spine and pelvis. No acute osseous   finding.   Review of the MIP images confirms the above findings.   IMPRESSION:   CTA CHEST IMPRESSION   Negative  for thoracic aorta dissection. Thoracic aortic   atherosclerosis and coronary atherosclerosis.   No acute intra thoracic finding   CTA ABDOMEN AND PELVIS IMPRESSION   Negative for abdominal aortic or iliac dissection. Abdominal aortic   atherosclerosis diffusely.   Negative for mesenteric or renal vascular occlusive process.   No iliac occlusion or inflow disease.   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   On: 03/09/2013 16:35     Impression:  The patient has long-standing recurrent persistent atrial fibrillation and has failed multiple cardioversions in the distant past as well as long-term treatment with Tikosyn. He has remained in persistent atrial fibrillation for several months and is symptomatic with significant exertional shortness of breath, New York Heart Association functional class II.  He also describes atypical symptoms of left sided chest pain, although these are  not related to physical exertion.  Cardiac catheterization demonstrates the presence of significant left main disease and two-vessel coronary artery disease with preserved left ventricular systolic function.  Symptoms are likely further exacerbated by the presence of long-standing hypertension with hypertensive cardiomyopathy and significant diastolic dysfunction. He does not have significant valvular heart disease and there was no sign of significant dynamic left ventricular output tract obstruction on recent transthoracic echocardiogram.  He is otherwise relatively healthy and appears to be a good candidate for surgical intervention.  Given the findings at catheterization I feel strongly the patient should undergo coronary artery bypass grafting with concomitant Maze procedure.   Plan:  I have reviewed the indications, risks, and potential benefits of coronary artery bypass grafting and maze procedure with the patient and his wife.   Alternative treatment strategies have been discussed.  The patient understands and accepts all potential associated risks of surgery including but not limited to risk of death, stroke or other neurologic complication, myocardial infarction, congestive heart failure, respiratory failure, renal failure, bleeding requiring blood transfusion and/or reexploration, aortic dissection or other major vascular complication, arrhythmia, heart block or bradycardia requiring permanent pacemaker, pneumonia, pleural effusion, wound infection, pulmonary embolus or other thromboembolic complication, chronic pain or other delayed complications related to median sternotomy, or the late recurrence of symptomatic ischemic heart disease, atrial fibrillation/atrial flutter and/or congestive heart failure.  The importance of long term risk modification have been emphasized.  All questions answered.  For personal reasons the patient desires to hold off on surgery until after 04/01/2013. We tentatively plan to proceed with surgery on Wednesday, 04/05/2013.   He has been given a prescription for amiodarone to begin one week prior to surgery.    Valentina Gu. Roxy Manns, MD

## 2013-04-03 ENCOUNTER — Ambulatory Visit (HOSPITAL_COMMUNITY)
Admission: RE | Admit: 2013-04-03 | Discharge: 2013-04-03 | Disposition: A | Discharge status: Home / Self Care | Payer: Medicare Other | Source: Ambulatory Visit | Attending: Thoracic Surgery (Cardiothoracic Vascular Surgery) | Admitting: Thoracic Surgery (Cardiothoracic Vascular Surgery)

## 2013-04-03 ENCOUNTER — Encounter (HOSPITAL_COMMUNITY)
Admission: RE | Admit: 2013-04-03 | Discharge: 2013-04-03 | Disposition: A | Discharge status: Home / Self Care | Payer: Medicare Other | Source: Ambulatory Visit | Attending: Thoracic Surgery (Cardiothoracic Vascular Surgery) | Admitting: Thoracic Surgery (Cardiothoracic Vascular Surgery)

## 2013-04-03 ENCOUNTER — Ambulatory Visit (INDEPENDENT_AMBULATORY_CARE_PROVIDER_SITE_OTHER): Payer: Medicare Other | Admitting: Thoracic Surgery (Cardiothoracic Vascular Surgery)

## 2013-04-03 ENCOUNTER — Encounter: Payer: Self-pay | Admitting: Thoracic Surgery (Cardiothoracic Vascular Surgery)

## 2013-04-03 ENCOUNTER — Encounter (HOSPITAL_COMMUNITY): Payer: Self-pay

## 2013-04-03 VITALS — BP 132/70 | HR 58 | Resp 16 | Ht 71.0 in | Wt 200.0 lb

## 2013-04-03 DIAGNOSIS — I2510 Atherosclerotic heart disease of native coronary artery without angina pectoris: Secondary | ICD-10-CM

## 2013-04-03 DIAGNOSIS — I4891 Unspecified atrial fibrillation: Secondary | ICD-10-CM

## 2013-04-03 DIAGNOSIS — Z01810 Encounter for preprocedural cardiovascular examination: Secondary | ICD-10-CM

## 2013-04-03 DIAGNOSIS — I4819 Other persistent atrial fibrillation: Secondary | ICD-10-CM

## 2013-04-03 HISTORY — DX: Gastro-esophageal reflux disease without esophagitis: K21.9

## 2013-04-03 HISTORY — DX: Anxiety disorder, unspecified: F41.9

## 2013-04-03 HISTORY — DX: Other specified postprocedural states: Z98.890

## 2013-04-03 HISTORY — DX: Benign neoplasm of unspecified adrenal gland: D35.00

## 2013-04-03 HISTORY — DX: Personal history of other diseases of the digestive system: Z87.19

## 2013-04-03 HISTORY — DX: Personal history of other medical treatment: Z92.89

## 2013-04-03 LAB — PULMONARY FUNCTION TEST
DL/VA % PRED: 136 %
DL/VA: 6.34 ml/min/mmHg/L
DLCO COR: 34.61 ml/min/mmHg
DLCO UNC % PRED: 102 %
DLCO UNC: 34.61 ml/min/mmHg
DLCO cor % pred: 102 %
FEF 25-75 PRE: 2.01 L/s
FEF 25-75 Post: 2.45 L/sec
FEF2575-%Change-Post: 21 %
FEF2575-%Pred-Post: 103 %
FEF2575-%Pred-Pre: 84 %
FEV1-%CHANGE-POST: 2 %
FEV1-%PRED-POST: 77 %
FEV1-%Pred-Pre: 75 %
FEV1-PRE: 2.43 L
FEV1-Post: 2.49 L
FEV1FVC-%Change-Post: 7 %
FEV1FVC-%PRED-PRE: 106 %
FEV6-%Change-Post: -4 %
FEV6-%PRED-POST: 71 %
FEV6-%PRED-PRE: 74 %
FEV6-PRE: 3.12 L
FEV6-Post: 2.97 L
FEV6FVC-%Change-Post: 0 %
FEV6FVC-%PRED-POST: 106 %
FEV6FVC-%PRED-PRE: 106 %
FVC-%Change-Post: -4 %
FVC-%PRED-PRE: 70 %
FVC-%Pred-Post: 67 %
FVC-POST: 2.98 L
FVC-PRE: 3.14 L
POST FEV6/FVC RATIO: 100 %
PRE FEV6/FVC RATIO: 100 %
Post FEV1/FVC ratio: 83 %
Pre FEV1/FVC ratio: 78 %
RV % PRED: 77 %
RV: 2.01 L
TLC % PRED: 76 %
TLC: 5.52 L

## 2013-04-03 LAB — CBC
HCT: 43.7 % (ref 39.0–52.0)
Hemoglobin: 15.1 g/dL (ref 13.0–17.0)
MCH: 32.1 pg (ref 26.0–34.0)
MCHC: 34.6 g/dL (ref 30.0–36.0)
MCV: 92.8 fL (ref 78.0–100.0)
Platelets: 168 10*3/uL (ref 150–400)
RBC: 4.71 MIL/uL (ref 4.22–5.81)
RDW: 13.8 % (ref 11.5–15.5)
WBC: 10 10*3/uL (ref 4.0–10.5)

## 2013-04-03 LAB — BLOOD GAS, ARTERIAL
ACID-BASE EXCESS: 0.1 mmol/L (ref 0.0–2.0)
Bicarbonate: 23.8 mEq/L (ref 20.0–24.0)
Drawn by: 344381
O2 Saturation: 96.2 %
Patient temperature: 98.6
TCO2: 24.9 mmol/L (ref 0–100)
pCO2 arterial: 36.1 mmHg (ref 35.0–45.0)
pH, Arterial: 7.435 (ref 7.350–7.450)
pO2, Arterial: 75.1 mmHg — ABNORMAL LOW (ref 80.0–100.0)

## 2013-04-03 LAB — HEMOGLOBIN A1C
Hgb A1c MFr Bld: 6.9 % — ABNORMAL HIGH (ref <5.7)
Mean Plasma Glucose: 151 mg/dL — ABNORMAL HIGH (ref <117)

## 2013-04-03 LAB — URINALYSIS, ROUTINE W REFLEX MICROSCOPIC
BILIRUBIN URINE: NEGATIVE
Glucose, UA: NEGATIVE mg/dL
Hgb urine dipstick: NEGATIVE
Ketones, ur: NEGATIVE mg/dL
Leukocytes, UA: NEGATIVE
Nitrite: NEGATIVE
Protein, ur: 30 mg/dL — AB
Specific Gravity, Urine: 1.023 (ref 1.005–1.030)
UROBILINOGEN UA: 0.2 mg/dL (ref 0.0–1.0)
pH: 5.5 (ref 5.0–8.0)

## 2013-04-03 LAB — COMPREHENSIVE METABOLIC PANEL
ALBUMIN: 4.3 g/dL (ref 3.5–5.2)
ALT: 24 U/L (ref 0–53)
AST: 23 U/L (ref 0–37)
Alkaline Phosphatase: 60 U/L (ref 39–117)
BUN: 23 mg/dL (ref 6–23)
CO2: 20 mEq/L (ref 19–32)
CREATININE: 0.85 mg/dL (ref 0.50–1.35)
Calcium: 9.3 mg/dL (ref 8.4–10.5)
Chloride: 104 mEq/L (ref 96–112)
GFR calc Af Amer: >90 mL/min (ref >90)
GFR calc non Af Amer: 84 mL/min — ABNORMAL LOW (ref >90)
Glucose, Bld: 250 mg/dL — ABNORMAL HIGH (ref 70–99)
Potassium: 4.3 mEq/L (ref 3.7–5.3)
Sodium: 140 mEq/L (ref 137–147)
TOTAL PROTEIN: 7.6 g/dL (ref 6.0–8.3)
Total Bilirubin: 0.7 mg/dL (ref 0.3–1.2)

## 2013-04-03 LAB — APTT: APTT: 32 s (ref 24–37)

## 2013-04-03 LAB — URINE MICROSCOPIC-ADD ON

## 2013-04-03 LAB — PROTIME-INR
INR: 1.13 (ref 0.00–1.49)
Prothrombin Time: 14.3 seconds (ref 11.6–15.2)

## 2013-04-03 LAB — TYPE AND SCREEN
ABO/RH(D): O POS
ANTIBODY SCREEN: NEGATIVE

## 2013-04-03 LAB — SURGICAL PCR SCREEN
MRSA, PCR: NEGATIVE
STAPHYLOCOCCUS AUREUS: NEGATIVE

## 2013-04-03 MED ORDER — ALBUTEROL SULFATE (2.5 MG/3ML) 0.083% IN NEBU
2.5000 mg | INHALATION_SOLUTION | Freq: Once | RESPIRATORY_TRACT | Status: AC
  Administered 2013-04-03: 2.5 mg via RESPIRATORY_TRACT

## 2013-04-03 NOTE — Progress Notes (Signed)
[address]       [place] [address]             [phone]     CARDIOTHORACIC SURGERY OFFICE NOTE  Referring Provider is Thompson Grayer, MD PCP is Alesia Richards, MD   HPI:  Patient returns for followup of left main disease and coronary artery disease with long-standing recurrent persistent atrial fibrillation. He was originally seen in consultation on 03/01/2013 and he was seen most recently on 03/13/2013.  He returns for followup today with plans to proceed with surgery later this week. He reports no new problems or complaints.    Current Outpatient Prescriptions  Medication Sig Dispense Refill  . acetaminophen (TYLENOL) 500 MG tablet Take 1,000 mg by mouth every 6 (six) hours as needed for mild pain.       [name] ALPRAZolam (XANAX) 0.25 MG tablet Take 0.25-1 mg by mouth at bedtime as needed for sleep.       [name] amiodarone (PACERONE) 200 MG tablet Take 1 tablet (200 mg total) by mouth 2 (two) times daily. Begin 7 days prior to surgery.  30 tablet  0  . amLODipine (NORVASC) 10 MG tablet Take 10 mg by mouth 2 (two) times daily.      [name] atorvastatin (LIPITOR) 20 MG tablet Take 20 mg by mouth 3 (three) times a week. Monday Wednesday and Friday      . BESIVANCE 0.6 % SUSP Place 1 drop into the left eye 4 (four) times daily as needed (after injection). Use in left eye 4 times a day after each injection      . Cholecalciferol (VITAMIN D3) 5000 UNITS CAPS Take 5,000 Units by mouth daily.      [name] CINNAMON PO Take 2,000 mg by mouth daily.      [name] CLONIDINE HCL PO Take 0.2 mg by mouth 3 (three) times daily.       . diphenhydrAMINE (BENADRYL) 25 MG tablet Take 25 mg by mouth every 6 (six) hours as needed for allergies.       [name] glimepiride (AMARYL) 2 MG tablet Take 2 mg by mouth daily before breakfast.        . HYDROcodone-acetaminophen (VICODIN) 5-500 MG per tablet Take 1 tablet by mouth every 6 (six) hours as needed for pain.       . metFORMIN (GLUCOPHAGE-XR) 500 MG 24  hr tablet Take 1,000 mg by mouth 2 (two) times daily.       . minoxidil (LONITEN) 10 MG tablet Take 20 mg by mouth at bedtime.      . Multiple Vitamins-Minerals (PRESERVISION AREDS PO) Take 1 capsule by mouth 2 (two) times daily.      . potassium chloride SA (K-DUR,KLOR-CON) 20 MEQ tablet Take 20 mEq by mouth 3 (three) times daily.      . ranitidine (ZANTAC) 150 MG tablet Take 150 mg by mouth daily as needed for heartburn.       . sertraline (ZOLOFT) 100 MG tablet Take 100 mg by mouth daily.      [name] triamterene-hydrochlorothiazide (MAXZIDE-25) 37.5-25 MG per tablet Take 1 tablet by mouth daily.  30 tablet  12  . valsartan (DIOVAN) 320 MG tablet Take 320 mg by mouth daily.      . Vitamins-Lipotropics (SUPER STRESS B-COMPLEX CR PO) Take 1 capsule by mouth daily.       . nitroGLYCERIN (NITROSTAT) 0.4 MG SL tablet Place 1 tablet (0.4 mg total) under the tongue every  5 (five) minutes as needed.  25 tablet  3   No current facility-administered medications for this visit.      Physical Exam:   BP 132/70  Pulse 58  Resp 16  Ht 5\' 11"  (1.803 m)  Wt 200 lb (90.719 kg)  BMI 27.91 kg/m2  SpO2 98%  General:  Well-appearing  Chest:   Clear to auscultation  CV:   Irregular rate and rhythm  Incisions:  n/a  Abdomen:  Soft and nontender  Extremities:  Warm and well-perfused  Diagnostic Tests:  n/a   Impression:  The patient has left main disease with two-vessel coronary artery disease and preserved left ventricular systolic function. He also has long-standing recurrent persistent atrial fibrillation and has failed multiple cardioversions in the distant past as well as long-term treatment with Tikosyn. He has remained in persistent atrial fibrillation for several months and is symptomatic with significant exertional shortness of breath, New York Heart Association functional class II. Symptoms are likely further exacerbated by the presence of long-standing hypertension with hypertensive  cardiomyopathy and significant diastolic dysfunction. He does not have significant valvular heart disease and there was no sign of significant dynamic left ventricular output tract obstruction on recent transthoracic echocardiogram. He does describe some atypical symptoms of left sided chest pain, but these are not related to physical exertion. He is otherwise relatively healthy and appears to be a good candidate for surgical intervention. Given the findings at catheterization I feel the patient should undergo coronary artery bypass grafting with concomitant Maze procedure.   Plan:  I have again reviewed the indications, risks, and potential benefits of surgery with the patient and his wife. Alternative treatment strategies been discussed. The understand and accept also said risks and desire to proceed as planned  I spent in excess of 15 minutes during the conduct of this office consultation and >50% of this time involved direct face-to-face encounter with the patient for counseling and/or coordination of their care.    Valentina Gu. Roxy Manns, MD 04/03/2013 4:02 PM

## 2013-04-03 NOTE — Progress Notes (Signed)
Pre-op Cardiac Surgery  Carotid Findings:  1-39% ICA stenosis.  Vertebral artery flow is antegrade.  Upper Extremity Right Left  Brachial Pressures 139T 141T  Radial Waveforms T T  Ulnar Waveforms T T  Palmar Arch (Allen's Test) WNL WNL   Findings:  WNL    Lower  Extremity Right Left  Dorsalis Pedis    Anterior Tibial 163T 157T  Posterior Tibial 151T 162T  Ankle/Brachial Indices >1.0 >1.0    Findings:  WNL

## 2013-04-03 NOTE — Pre-Procedure Instructions (Signed)
[name]  04/03/2013   Your procedure is scheduled on: Wednesday, April 05, 2013 at 8:30 AM  Report to [facility] Short Stay (use Main Entrance "A'') at 6:30 AM.  Call this number if you have problems the morning of surgery: [phone]   Remember:   Do not eat food or drink liquids after midnight.   Take these medicines the morning of surgery with A SIP OF WATER: amiodarone (PACERONE),  amLODipine (NORVASC),CLONIDINE, sertraline (ZOLOFT),  BESIVANCE 0.6 % SUSP If needed: Pain medication, nitroGLYCERIN (NITROSTAT), ranitidine (ZANTAC) for heartburn, Stop taking Multivitamins and herbal medications ( Cinnamon ).  Do not take any NSAIDs ie: Ibuprofen, Advil, Naproxen, or Goody powder  Do not wear jewelry, make-up or nail polish.  Do not wear lotions, powders, or perfumes. You may  NOT wear deodorant.  Do not shave 48 hours prior to surgery. Men may shave face and neck.  Do not bring valuables to the hospital.  [facility] is not responsible for any belongings or valuables.               Contacts, dentures or bridgework may not be worn into surgery.  Leave suitcase in the car. After surgery it may be brought to your room.  For patients admitted to the hospital, discharge time is determined by your treatment team.               Patients discharged the day of surgery will not be allowed to drive home.  Name and phone number of your driver:   Special Instructions:  Special Instructions:Special Instructions: [facility] - Preparing for Surgery  Before surgery, you can play an important role.  Because skin is not sterile, your skin needs to be as free of germs as possible.  You can reduce the number of germs on you skin by washing with CHG (chlorahexidine gluconate) soap before surgery.  CHG is an antiseptic cleaner which kills germs and bonds with the skin to continue killing germs even after washing.  Please DO NOT use if you have an allergy to CHG or antibacterial soaps.  If your skin  becomes reddened/irritated stop using the CHG and inform your nurse when you arrive at Short Stay.  Do not shave (including legs and underarms) for at least 48 hours prior to the first CHG shower.  You may shave your face.  Please follow these instructions carefully:   1.  Shower with CHG Soap the night before surgery and the morning of Surgery.  2.  If you choose to wash your hair, wash your hair first as usual with your normal shampoo.  3.  After you shampoo, rinse your hair and body thoroughly to remove the Shampoo.  4.  Use CHG as you would any other liquid soap.  You can apply chg directly  to the skin and wash gently with scrungie or a clean washcloth.  5.  Apply the CHG Soap to your body ONLY FROM THE NECK DOWN.  Do not use on open wounds or open sores.  Avoid contact with your eyes, ears, mouth and genitals (private parts).  Wash genitals (private parts) with your normal soap.  6.  Wash thoroughly, paying special attention to the area where your surgery will be performed.  7.  Thoroughly rinse your body with warm water from the neck down.  8.  DO NOT shower/wash with your normal soap after using and rinsing off the CHG Soap.  9.  Pat yourself dry with a clean  towel.            10.  Wear clean pajamas.            11.  Place clean sheets on your bed the night of your first shower and do not sleep with pets.  Day of Surgery  Do not apply any lotions/deodorants the morning of surgery.  Please wear clean clothes to the hospital/surgery center.   Please read over the following fact sheets that you were given: Pain Booklet, Coughing and Deep Breathing, Blood Transfusion Information, Open Heart Packet, MRSA Information and Surgical Site Infection Prevention

## 2013-04-03 NOTE — Progress Notes (Signed)
Spoke with Manuela Schwartz, nurse at Dr. Guy Sandifer office to make MD aware that pt glucose was 250.

## 2013-04-04 ENCOUNTER — Encounter (INDEPENDENT_AMBULATORY_CARE_PROVIDER_SITE_OTHER): Payer: Medicare Other | Admitting: Ophthalmology

## 2013-04-04 MED ORDER — METOPROLOL TARTRATE 12.5 MG HALF TABLET
12.5000 mg | ORAL_TABLET | ORAL | Status: AC
  Administered 2013-04-05: 12.5 mg via ORAL
  Filled 2013-04-04: qty 1

## 2013-04-04 MED ORDER — EPINEPHRINE HCL 1 MG/ML IJ SOLN
0.5000 ug/min | INTRAVENOUS | Status: DC
  Filled 2013-04-04: qty 4

## 2013-04-04 MED ORDER — DOPAMINE-DEXTROSE 3.2-5 MG/ML-% IV SOLN
2.0000 ug/kg/min | INTRAVENOUS | Status: DC
  Filled 2013-04-04: qty 250

## 2013-04-04 MED ORDER — CEFUROXIME SODIUM 1.5 G IJ SOLR
1.5000 g | INTRAMUSCULAR | Status: AC
  Administered 2013-04-05: 1.5 g via INTRAVENOUS
  Administered 2013-04-05: .75 g via INTRAVENOUS
  Filled 2013-04-04 (×2): qty 1.5

## 2013-04-04 MED ORDER — VANCOMYCIN HCL 1000 MG IV SOLR
INTRAVENOUS | Status: AC
  Administered 2013-04-05: 11:00:00
  Filled 2013-04-04: qty 1000

## 2013-04-04 MED ORDER — SODIUM CHLORIDE 0.9 % IV SOLN
INTRAVENOUS | Status: DC
  Filled 2013-04-04: qty 1

## 2013-04-04 MED ORDER — DEXMEDETOMIDINE HCL IN NACL 400 MCG/100ML IV SOLN
0.1000 ug/kg/h | INTRAVENOUS | Status: DC
  Filled 2013-04-04: qty 100

## 2013-04-04 MED ORDER — POTASSIUM CHLORIDE 2 MEQ/ML IV SOLN
80.0000 meq | INTRAVENOUS | Status: DC
  Filled 2013-04-04: qty 40

## 2013-04-04 MED ORDER — PLASMA-LYTE 148 IV SOLN
INTRAVENOUS | Status: AC
  Administered 2013-04-05: 10:00:00
  Filled 2013-04-04: qty 2.5

## 2013-04-04 MED ORDER — SODIUM CHLORIDE 0.9 % IV SOLN
INTRAVENOUS | Status: DC
  Filled 2013-04-04: qty 30

## 2013-04-04 MED ORDER — DEXTROSE 5 % IV SOLN
750.0000 mg | INTRAVENOUS | Status: DC
  Filled 2013-04-04: qty 750

## 2013-04-04 MED ORDER — MAGNESIUM SULFATE 50 % IJ SOLN
40.0000 meq | INTRAMUSCULAR | Status: DC
  Filled 2013-04-04: qty 10

## 2013-04-04 MED ORDER — SODIUM CHLORIDE 0.9 % IV SOLN
INTRAVENOUS | Status: AC
  Administered 2013-04-05: 70 mL/h via INTRAVENOUS
  Filled 2013-04-04: qty 40

## 2013-04-04 MED ORDER — NITROGLYCERIN IN D5W 200-5 MCG/ML-% IV SOLN
2.0000 ug/min | INTRAVENOUS | Status: DC
  Filled 2013-04-04: qty 250

## 2013-04-04 MED ORDER — VANCOMYCIN HCL 10 G IV SOLR
1500.0000 mg | INTRAVENOUS | Status: AC
  Administered 2013-04-05: 1500 mg via INTRAVENOUS
  Filled 2013-04-04: qty 1500

## 2013-04-04 MED ORDER — CHLORHEXIDINE GLUCONATE 4 % EX LIQD
30.0000 mL | CUTANEOUS | Status: DC
  Filled 2013-04-04: qty 30

## 2013-04-04 MED ORDER — PHENYLEPHRINE HCL 10 MG/ML IJ SOLN
30.0000 ug/min | INTRAMUSCULAR | Status: DC
  Filled 2013-04-04: qty 2

## 2013-04-05 ENCOUNTER — Encounter (HOSPITAL_COMMUNITY): Payer: Self-pay | Admitting: Certified Registered"

## 2013-04-05 ENCOUNTER — Inpatient Hospital Stay (HOSPITAL_COMMUNITY): Payer: Medicare Other

## 2013-04-05 ENCOUNTER — Encounter (HOSPITAL_COMMUNITY)
Admission: RE | Disposition: A | Discharge status: Home / Self Care | Payer: Medicare Other | Source: Ambulatory Visit | Attending: Thoracic Surgery (Cardiothoracic Vascular Surgery)

## 2013-04-05 ENCOUNTER — Inpatient Hospital Stay (HOSPITAL_COMMUNITY)
Admission: RE | Admit: 2013-04-05 | Discharge: 2013-04-12 | DRG: 229 | Disposition: A | Discharge status: Home / Self Care | Payer: Medicare Other | Source: Ambulatory Visit | Attending: Thoracic Surgery (Cardiothoracic Vascular Surgery) | Admitting: Thoracic Surgery (Cardiothoracic Vascular Surgery)

## 2013-04-05 ENCOUNTER — Encounter (HOSPITAL_COMMUNITY): Payer: Medicare Other | Admitting: Certified Registered"

## 2013-04-05 ENCOUNTER — Inpatient Hospital Stay (HOSPITAL_COMMUNITY): Payer: Medicare Other | Admitting: Certified Registered"

## 2013-04-05 DIAGNOSIS — I4891 Unspecified atrial fibrillation: Secondary | ICD-10-CM

## 2013-04-05 DIAGNOSIS — E1169 Type 2 diabetes mellitus with other specified complication: Secondary | ICD-10-CM | POA: Diagnosis present

## 2013-04-05 DIAGNOSIS — Z9889 Other specified postprocedural states: Secondary | ICD-10-CM

## 2013-04-05 DIAGNOSIS — I480 Paroxysmal atrial fibrillation: Secondary | ICD-10-CM | POA: Diagnosis present

## 2013-04-05 DIAGNOSIS — G4733 Obstructive sleep apnea (adult) (pediatric): Secondary | ICD-10-CM | POA: Diagnosis present

## 2013-04-05 DIAGNOSIS — Z951 Presence of aortocoronary bypass graft: Secondary | ICD-10-CM

## 2013-04-05 DIAGNOSIS — I509 Heart failure, unspecified: Secondary | ICD-10-CM | POA: Diagnosis present

## 2013-04-05 DIAGNOSIS — I2510 Atherosclerotic heart disease of native coronary artery without angina pectoris: Principal | ICD-10-CM | POA: Diagnosis present

## 2013-04-05 DIAGNOSIS — E871 Hypo-osmolality and hyponatremia: Secondary | ICD-10-CM | POA: Diagnosis not present

## 2013-04-05 DIAGNOSIS — E785 Hyperlipidemia, unspecified: Secondary | ICD-10-CM | POA: Diagnosis present

## 2013-04-05 DIAGNOSIS — E119 Type 2 diabetes mellitus without complications: Secondary | ICD-10-CM | POA: Diagnosis present

## 2013-04-05 DIAGNOSIS — D62 Acute posthemorrhagic anemia: Secondary | ICD-10-CM | POA: Diagnosis not present

## 2013-04-05 DIAGNOSIS — K219 Gastro-esophageal reflux disease without esophagitis: Secondary | ICD-10-CM | POA: Diagnosis present

## 2013-04-05 DIAGNOSIS — I359 Nonrheumatic aortic valve disorder, unspecified: Secondary | ICD-10-CM | POA: Diagnosis present

## 2013-04-05 DIAGNOSIS — I43 Cardiomyopathy in diseases classified elsewhere: Secondary | ICD-10-CM | POA: Diagnosis present

## 2013-04-05 DIAGNOSIS — D696 Thrombocytopenia, unspecified: Secondary | ICD-10-CM | POA: Diagnosis not present

## 2013-04-05 DIAGNOSIS — I5032 Chronic diastolic (congestive) heart failure: Secondary | ICD-10-CM | POA: Diagnosis present

## 2013-04-05 DIAGNOSIS — F411 Generalized anxiety disorder: Secondary | ICD-10-CM | POA: Diagnosis present

## 2013-04-05 DIAGNOSIS — F3289 Other specified depressive episodes: Secondary | ICD-10-CM | POA: Diagnosis present

## 2013-04-05 DIAGNOSIS — I119 Hypertensive heart disease without heart failure: Secondary | ICD-10-CM | POA: Diagnosis present

## 2013-04-05 DIAGNOSIS — F329 Major depressive disorder, single episode, unspecified: Secondary | ICD-10-CM | POA: Diagnosis present

## 2013-04-05 DIAGNOSIS — Z8679 Personal history of other diseases of the circulatory system: Secondary | ICD-10-CM | POA: Insufficient documentation

## 2013-04-05 DIAGNOSIS — Z87891 Personal history of nicotine dependence: Secondary | ICD-10-CM

## 2013-04-05 DIAGNOSIS — M1990 Unspecified osteoarthritis, unspecified site: Secondary | ICD-10-CM | POA: Diagnosis present

## 2013-04-05 DIAGNOSIS — E782 Mixed hyperlipidemia: Secondary | ICD-10-CM | POA: Diagnosis present

## 2013-04-05 HISTORY — PX: CORONARY ARTERY BYPASS GRAFT: SHX141

## 2013-04-05 HISTORY — DX: Other specified postprocedural states: Z98.890

## 2013-04-05 HISTORY — PX: INTRAOPERATIVE TRANSESOPHAGEAL ECHOCARDIOGRAM: SHX5062

## 2013-04-05 HISTORY — PX: MAZE: SHX5063

## 2013-04-05 HISTORY — DX: Personal history of other diseases of the circulatory system: Z86.79

## 2013-04-05 LAB — POCT I-STAT 4, (NA,K, GLUC, HGB,HCT)
GLUCOSE: 222 mg/dL — AB (ref 70–99)
GLUCOSE: 81 mg/dL (ref 70–99)
Glucose, Bld: 161 mg/dL — ABNORMAL HIGH (ref 70–99)
Glucose, Bld: 206 mg/dL — ABNORMAL HIGH (ref 70–99)
Glucose, Bld: 136 mg/dL — ABNORMAL HIGH (ref 70–99)
Glucose, Bld: 146 mg/dL — ABNORMAL HIGH (ref 70–99)
HCT: 40 % (ref 39.0–52.0)
HCT: 29 % — ABNORMAL LOW (ref 39.0–52.0)
HCT: 37 % — ABNORMAL LOW (ref 39.0–52.0)
HCT: 30 % — ABNORMAL LOW (ref 39.0–52.0)
HCT: 41 % (ref 39.0–52.0)
HEMATOCRIT: 31 % — AB (ref 39.0–52.0)
HEMOGLOBIN: 12.6 g/dL — AB (ref 13.0–17.0)
HEMOGLOBIN: 13.9 g/dL (ref 13.0–17.0)
Hemoglobin: 13.6 g/dL (ref 13.0–17.0)
Hemoglobin: 9.9 g/dL — ABNORMAL LOW (ref 13.0–17.0)
Hemoglobin: 10.2 g/dL — ABNORMAL LOW (ref 13.0–17.0)
Hemoglobin: 10.5 g/dL — ABNORMAL LOW (ref 13.0–17.0)
POTASSIUM: 4.5 meq/L (ref 3.7–5.3)
POTASSIUM: 4.6 meq/L (ref 3.7–5.3)
POTASSIUM: 3.8 meq/L (ref 3.7–5.3)
Potassium: 4.4 mEq/L (ref 3.7–5.3)
Potassium: 4.2 mEq/L (ref 3.7–5.3)
Potassium: 4.2 mEq/L (ref 3.7–5.3)
SODIUM: 142 meq/L (ref 137–147)
SODIUM: 135 meq/L — AB (ref 137–147)
SODIUM: 140 meq/L (ref 137–147)
Sodium: 141 mEq/L (ref 137–147)
Sodium: 142 mEq/L (ref 137–147)
Sodium: 144 mEq/L (ref 137–147)

## 2013-04-05 LAB — POCT I-STAT 3, ART BLOOD GAS (G3+)
Acid-base deficit: 1 mmol/L (ref 0.0–2.0)
Acid-base deficit: 2 mmol/L (ref 0.0–2.0)
Acid-base deficit: 3 mmol/L — ABNORMAL HIGH (ref 0.0–2.0)
Acid-base deficit: 1 mmol/L (ref 0.0–2.0)
BICARBONATE: 25.3 meq/L — AB (ref 20.0–24.0)
BICARBONATE: 23.4 meq/L (ref 20.0–24.0)
Bicarbonate: 24.8 mEq/L — ABNORMAL HIGH (ref 20.0–24.0)
Bicarbonate: 21.5 mEq/L (ref 20.0–24.0)
O2 SAT: 91 %
O2 Saturation: 100 %
O2 Saturation: 87 %
O2 Saturation: 92 %
PCO2 ART: 52.6 mmHg — AB (ref 35.0–45.0)
PCO2 ART: 36.8 mmHg (ref 35.0–45.0)
PCO2 ART: 39.8 mmHg (ref 35.0–45.0)
PH ART: 7.291 — AB (ref 7.350–7.450)
Patient temperature: 37.2
Patient temperature: 37.2
Patient temperature: 37.4
TCO2: 26 mmol/L (ref 0–100)
TCO2: 27 mmol/L (ref 0–100)
TCO2: 23 mmol/L (ref 0–100)
TCO2: 25 mmol/L (ref 0–100)
pCO2 arterial: 46.8 mmHg — ABNORMAL HIGH (ref 35.0–45.0)
pH, Arterial: 7.332 — ABNORMAL LOW (ref 7.350–7.450)
pH, Arterial: 7.376 (ref 7.350–7.450)
pH, Arterial: 7.379 (ref 7.350–7.450)
pO2, Arterial: 373 mmHg — ABNORMAL HIGH (ref 80.0–100.0)
pO2, Arterial: 61 mmHg — ABNORMAL LOW (ref 80.0–100.0)
pO2, Arterial: 63 mmHg — ABNORMAL LOW (ref 80.0–100.0)
pO2, Arterial: 65 mmHg — ABNORMAL LOW (ref 80.0–100.0)

## 2013-04-05 LAB — CBC
HCT: 32.9 % — ABNORMAL LOW (ref 39.0–52.0)
HEMATOCRIT: 39.1 % (ref 39.0–52.0)
HEMOGLOBIN: 13.6 g/dL (ref 13.0–17.0)
HEMOGLOBIN: 11.4 g/dL — AB (ref 13.0–17.0)
MCH: 31.9 pg (ref 26.0–34.0)
MCH: 31.8 pg (ref 26.0–34.0)
MCHC: 34.8 g/dL (ref 30.0–36.0)
MCHC: 34.7 g/dL (ref 30.0–36.0)
MCV: 91.6 fL (ref 78.0–100.0)
MCV: 91.9 fL (ref 78.0–100.0)
PLATELETS: 149 10*3/uL — AB (ref 150–400)
Platelets: 135 10*3/uL — ABNORMAL LOW (ref 150–400)
RBC: 4.27 MIL/uL (ref 4.22–5.81)
RBC: 3.58 MIL/uL — ABNORMAL LOW (ref 4.22–5.81)
RDW: 13.8 % (ref 11.5–15.5)
RDW: 13.8 % (ref 11.5–15.5)
WBC: 28.6 10*3/uL — ABNORMAL HIGH (ref 4.0–10.5)
WBC: 23.8 10*3/uL — ABNORMAL HIGH (ref 4.0–10.5)

## 2013-04-05 LAB — HEMOGLOBIN AND HEMATOCRIT, BLOOD
HEMATOCRIT: 29.5 % — AB (ref 39.0–52.0)
Hemoglobin: 10.4 g/dL — ABNORMAL LOW (ref 13.0–17.0)

## 2013-04-05 LAB — POCT I-STAT, CHEM 8
BUN: 23 mg/dL (ref 6–23)
CHLORIDE: 107 meq/L (ref 96–112)
Calcium, Ion: 1.1 mmol/L — ABNORMAL LOW (ref 1.13–1.30)
Creatinine, Ser: 1 mg/dL (ref 0.50–1.35)
GLUCOSE: 119 mg/dL — AB (ref 70–99)
HCT: 33 % — ABNORMAL LOW (ref 39.0–52.0)
Hemoglobin: 11.2 g/dL — ABNORMAL LOW (ref 13.0–17.0)
Potassium: 4.6 mEq/L (ref 3.7–5.3)
Sodium: 143 mEq/L (ref 137–147)
TCO2: 23 mmol/L (ref 0–100)

## 2013-04-05 LAB — PLATELET COUNT: PLATELETS: 155 10*3/uL (ref 150–400)

## 2013-04-05 LAB — CREATININE, SERUM
Creatinine, Ser: 0.95 mg/dL (ref 0.50–1.35)
GFR calc non Af Amer: 80 mL/min — ABNORMAL LOW (ref >90)

## 2013-04-05 LAB — PROTIME-INR
INR: 1.5 — ABNORMAL HIGH (ref 0.00–1.49)
Prothrombin Time: 17.7 seconds — ABNORMAL HIGH (ref 11.6–15.2)

## 2013-04-05 LAB — GLUCOSE, CAPILLARY
Glucose-Capillary: 115 mg/dL — ABNORMAL HIGH (ref 70–99)
Glucose-Capillary: 165 mg/dL — ABNORMAL HIGH (ref 70–99)

## 2013-04-05 LAB — MAGNESIUM: MAGNESIUM: 2.9 mg/dL — AB (ref 1.5–2.5)

## 2013-04-05 LAB — APTT: aPTT: 33 seconds (ref 24–37)

## 2013-04-05 SURGERY — CORONARY ARTERY BYPASS GRAFTING (CABG)
Anesthesia: General | Site: Chest

## 2013-04-05 MED ORDER — PROPOFOL 10 MG/ML IV BOLUS
INTRAVENOUS | Status: AC
  Filled 2013-04-05: qty 20

## 2013-04-05 MED ORDER — HEPARIN SODIUM (PORCINE) 1000 UNIT/ML IJ SOLN
INTRAMUSCULAR | Status: AC
  Filled 2013-04-05: qty 1

## 2013-04-05 MED ORDER — SODIUM CHLORIDE 0.9 % IV SOLN
100.0000 [IU] | INTRAVENOUS | Status: DC | PRN
  Administered 2013-04-05: 4.9 [IU]/h via INTRAVENOUS

## 2013-04-05 MED ORDER — SODIUM CHLORIDE 0.9 % IV SOLN: INTRAVENOUS | Status: DC

## 2013-04-05 MED ORDER — VECURONIUM BROMIDE 10 MG IV SOLR
INTRAVENOUS | Status: AC
  Filled 2013-04-05: qty 10

## 2013-04-05 MED ORDER — POTASSIUM CHLORIDE 10 MEQ/50ML IV SOLN
10.0000 meq | Freq: Once | INTRAVENOUS | Status: AC
  Administered 2013-04-05: 10 meq via INTRAVENOUS

## 2013-04-05 MED ORDER — PANTOPRAZOLE SODIUM 40 MG PO TBEC
40.0000 mg | DELAYED_RELEASE_TABLET | Freq: Every day | ORAL | Status: DC
  Administered 2013-04-07 – 2013-04-12 (×6): 40 mg via ORAL
  Filled 2013-04-05 (×7): qty 1

## 2013-04-05 MED ORDER — MORPHINE SULFATE 2 MG/ML IJ SOLN
1.0000 mg | INTRAMUSCULAR | Status: AC | PRN
  Administered 2013-04-05: 4 mg via INTRAVENOUS
  Administered 2013-04-05 (×4): 2 mg via INTRAVENOUS
  Filled 2013-04-05 (×3): qty 1
  Filled 2013-04-05: qty 2

## 2013-04-05 MED ORDER — MIDAZOLAM HCL 2 MG/2ML IJ SOLN
2.0000 mg | INTRAMUSCULAR | Status: DC | PRN
  Administered 2013-04-05: 2 mg via INTRAVENOUS

## 2013-04-05 MED ORDER — ACETAMINOPHEN 500 MG PO TABS
1000.0000 mg | ORAL_TABLET | Freq: Four times a day (QID) | ORAL | Status: AC
  Administered 2013-04-06 – 2013-04-10 (×18): 1000 mg via ORAL
  Filled 2013-04-05 (×21): qty 2

## 2013-04-05 MED ORDER — METOPROLOL TARTRATE 12.5 MG HALF TABLET
12.5000 mg | ORAL_TABLET | Freq: Two times a day (BID) | ORAL | Status: DC
  Filled 2013-04-05 (×3): qty 1

## 2013-04-05 MED ORDER — METOPROLOL TARTRATE 1 MG/ML IV SOLN
2.5000 mg | INTRAVENOUS | Status: DC | PRN
  Filled 2013-04-05: qty 5

## 2013-04-05 MED ORDER — SODIUM CHLORIDE 0.9 % IJ SOLN
INTRAMUSCULAR | Status: DC | PRN
  Administered 2013-04-05 (×7): via TOPICAL

## 2013-04-05 MED ORDER — MIDAZOLAM HCL 2 MG/2ML IJ SOLN
INTRAMUSCULAR | Status: AC
  Filled 2013-04-05: qty 2

## 2013-04-05 MED ORDER — ALBUMIN HUMAN 5 % IV SOLN
INTRAVENOUS | Status: DC | PRN
  Administered 2013-04-05 (×2): via INTRAVENOUS

## 2013-04-05 MED ORDER — LIDOCAINE HCL (CARDIAC) 20 MG/ML IV SOLN
INTRAVENOUS | Status: AC
  Filled 2013-04-05: qty 5

## 2013-04-05 MED ORDER — VANCOMYCIN HCL IN DEXTROSE 1-5 GM/200ML-% IV SOLN
1000.0000 mg | Freq: Once | INTRAVENOUS | Status: AC
  Administered 2013-04-06: 1000 mg via INTRAVENOUS
  Filled 2013-04-05: qty 200

## 2013-04-05 MED ORDER — SODIUM CHLORIDE 0.9 % IV SOLN
INTRAVENOUS | Status: DC | PRN
  Administered 2013-04-05: 14:00:00 via INTRAVENOUS

## 2013-04-05 MED ORDER — PROPOFOL 10 MG/ML IV BOLUS
INTRAVENOUS | Status: DC | PRN
  Administered 2013-04-05 (×2): 50 mg via INTRAVENOUS

## 2013-04-05 MED ORDER — LACTATED RINGERS IV SOLN: 500.0000 mL | Freq: Once | INTRAVENOUS | Status: AC | PRN

## 2013-04-05 MED ORDER — SODIUM CHLORIDE 0.9 % IJ SOLN
3.0000 mL | Freq: Two times a day (BID) | INTRAMUSCULAR | Status: DC
  Administered 2013-04-06 – 2013-04-10 (×9): 3 mL via INTRAVENOUS

## 2013-04-05 MED ORDER — NITROGLYCERIN IN D5W 200-5 MCG/ML-% IV SOLN
0.0000 ug/min | INTRAVENOUS | Status: DC
Start: 2013-04-05 — End: 2013-04-06
  Administered 2013-04-05: 5 ug/min via INTRAVENOUS

## 2013-04-05 MED ORDER — LACTATED RINGERS IV SOLN
INTRAVENOUS | Status: DC | PRN
  Administered 2013-04-05: 08:00:00 via INTRAVENOUS

## 2013-04-05 MED ORDER — ASPIRIN 81 MG PO CHEW: 324.0000 mg | CHEWABLE_TABLET | Freq: Every day | ORAL | Status: DC

## 2013-04-05 MED ORDER — SUCCINYLCHOLINE CHLORIDE 20 MG/ML IJ SOLN
INTRAMUSCULAR | Status: AC
  Filled 2013-04-05: qty 1

## 2013-04-05 MED ORDER — DEXTROSE 5 % IV SOLN
1.5000 g | Freq: Two times a day (BID) | INTRAVENOUS | Status: AC
  Administered 2013-04-05 – 2013-04-07 (×4): 1.5 g via INTRAVENOUS
  Filled 2013-04-05 (×5): qty 1.5

## 2013-04-05 MED ORDER — MAGNESIUM SULFATE 4000MG/100ML IJ SOLN
4.0000 g | Freq: Once | INTRAMUSCULAR | Status: AC
  Administered 2013-04-05: 4 g via INTRAVENOUS
  Filled 2013-04-05: qty 100

## 2013-04-05 MED ORDER — SODIUM CHLORIDE 0.9 % IV SOLN
250.0000 mL | INTRAVENOUS | Status: AC
  Administered 2013-04-05: 1000 mL via INTRAVENOUS

## 2013-04-05 MED ORDER — DEXTROSE 5 % IV SOLN
0.0000 ug/min | INTRAVENOUS | Status: DC
  Administered 2013-04-06: 30 ug/min via INTRAVENOUS
  Filled 2013-04-05 (×2): qty 2

## 2013-04-05 MED ORDER — PROTAMINE SULFATE 10 MG/ML IV SOLN
INTRAVENOUS | Status: AC
  Filled 2013-04-05: qty 25

## 2013-04-05 MED ORDER — LACTATED RINGERS IV SOLN
INTRAVENOUS | Status: DC | PRN
  Administered 2013-04-05 (×2): via INTRAVENOUS

## 2013-04-05 MED ORDER — INSULIN REGULAR BOLUS VIA INFUSION
0.0000 [IU] | Freq: Three times a day (TID) | INTRAVENOUS | Status: DC
  Filled 2013-04-05: qty 10

## 2013-04-05 MED ORDER — BISACODYL 10 MG RE SUPP: 10.0000 mg | Freq: Every day | RECTAL | Status: DC

## 2013-04-05 MED ORDER — SODIUM CHLORIDE 0.45 % IV SOLN
INTRAVENOUS | Status: DC
  Administered 2013-04-05: 15:00:00 via INTRAVENOUS

## 2013-04-05 MED ORDER — NITROGLYCERIN IN D5W 200-5 MCG/ML-% IV SOLN
INTRAVENOUS | Status: DC | PRN
  Administered 2013-04-05: 5 ug/min via INTRAVENOUS

## 2013-04-05 MED ORDER — DOCUSATE SODIUM 100 MG PO CAPS
200.0000 mg | ORAL_CAPSULE | Freq: Every day | ORAL | Status: DC
  Administered 2013-04-06 – 2013-04-11 (×6): 200 mg via ORAL
  Filled 2013-04-05 (×7): qty 2

## 2013-04-05 MED ORDER — ASPIRIN EC 325 MG PO TBEC
325.0000 mg | DELAYED_RELEASE_TABLET | Freq: Every day | ORAL | Status: DC
  Administered 2013-04-06 – 2013-04-08 (×3): 325 mg via ORAL
  Filled 2013-04-05 (×4): qty 1

## 2013-04-05 MED ORDER — ARTIFICIAL TEARS OP OINT
TOPICAL_OINTMENT | OPHTHALMIC | Status: DC | PRN
  Administered 2013-04-05: 1 via OPHTHALMIC

## 2013-04-05 MED ORDER — MORPHINE SULFATE 2 MG/ML IJ SOLN
2.0000 mg | INTRAMUSCULAR | Status: DC | PRN
Start: 2013-04-05 — End: 2013-04-06
  Administered 2013-04-06 (×3): 4 mg via INTRAVENOUS
  Filled 2013-04-05 (×3): qty 2

## 2013-04-05 MED ORDER — 0.9 % SODIUM CHLORIDE (POUR BTL) OPTIME
TOPICAL | Status: DC | PRN
  Administered 2013-04-05: 6000 mL

## 2013-04-05 MED ORDER — PROTAMINE SULFATE 10 MG/ML IV SOLN
INTRAVENOUS | Status: DC | PRN
  Administered 2013-04-05 (×2): 110 mg via INTRAVENOUS

## 2013-04-05 MED ORDER — HEPARIN SODIUM (PORCINE) 1000 UNIT/ML IJ SOLN
INTRAMUSCULAR | Status: DC | PRN
  Administered 2013-04-05: 3000 [IU] via INTRAVENOUS
  Administered 2013-04-05: 23000 [IU] via INTRAVENOUS

## 2013-04-05 MED ORDER — PHENYLEPHRINE 40 MCG/ML (10ML) SYRINGE FOR IV PUSH (FOR BLOOD PRESSURE SUPPORT)
PREFILLED_SYRINGE | INTRAVENOUS | Status: AC
  Filled 2013-04-05: qty 10

## 2013-04-05 MED ORDER — MIDAZOLAM HCL 10 MG/2ML IJ SOLN
INTRAMUSCULAR | Status: AC
  Filled 2013-04-05: qty 2

## 2013-04-05 MED ORDER — ACETAMINOPHEN 160 MG/5ML PO SOLN: 1000.0000 mg | Freq: Four times a day (QID) | ORAL | Status: DC

## 2013-04-05 MED ORDER — DEXMEDETOMIDINE HCL IN NACL 200 MCG/50ML IV SOLN
0.1000 ug/kg/h | INTRAVENOUS | Status: DC
  Administered 2013-04-05: 0.7 ug/kg/h via INTRAVENOUS
  Filled 2013-04-05 (×2): qty 50

## 2013-04-05 MED ORDER — GLYCOPYRROLATE 0.2 MG/ML IJ SOLN
INTRAMUSCULAR | Status: DC | PRN
  Administered 2013-04-05: 0.2 mg via INTRAVENOUS

## 2013-04-05 MED ORDER — SODIUM CHLORIDE 0.9 % IV SOLN
INTRAVENOUS | Status: DC
  Administered 2013-04-05: 1.5 [IU]/h via INTRAVENOUS
  Filled 2013-04-05 (×2): qty 1

## 2013-04-05 MED ORDER — POTASSIUM CHLORIDE 10 MEQ/50ML IV SOLN
10.0000 meq | INTRAVENOUS | Status: AC
  Administered 2013-04-05 (×3): 10 meq via INTRAVENOUS

## 2013-04-05 MED ORDER — VECURONIUM BROMIDE 10 MG IV SOLR
INTRAVENOUS | Status: DC | PRN
  Administered 2013-04-05: 3 mg via INTRAVENOUS
  Administered 2013-04-05: 7 mg via INTRAVENOUS
  Administered 2013-04-05: 3 mg via INTRAVENOUS
  Administered 2013-04-05: 2 mg via INTRAVENOUS
  Administered 2013-04-05: 3 mg via INTRAVENOUS
  Administered 2013-04-05: 2 mg via INTRAVENOUS

## 2013-04-05 MED ORDER — ALBUMIN HUMAN 5 % IV SOLN
250.0000 mL | INTRAVENOUS | Status: AC | PRN
  Administered 2013-04-05 (×3): 250 mL via INTRAVENOUS
  Filled 2013-04-05: qty 250

## 2013-04-05 MED ORDER — BISACODYL 5 MG PO TBEC
10.0000 mg | DELAYED_RELEASE_TABLET | Freq: Every day | ORAL | Status: DC
  Administered 2013-04-06 – 2013-04-09 (×4): 10 mg via ORAL
  Filled 2013-04-05 (×4): qty 2

## 2013-04-05 MED ORDER — METOPROLOL TARTRATE 25 MG/10 ML ORAL SUSPENSION
12.5000 mg | Freq: Two times a day (BID) | ORAL | Status: DC
  Filled 2013-04-05 (×3): qty 5

## 2013-04-05 MED ORDER — PHENYLEPHRINE HCL 10 MG/ML IJ SOLN
20.0000 mg | INTRAVENOUS | Status: DC | PRN
  Administered 2013-04-05: 50 ug/min via INTRAVENOUS

## 2013-04-05 MED ORDER — ARTIFICIAL TEARS OP OINT
TOPICAL_OINTMENT | OPHTHALMIC | Status: AC
  Filled 2013-04-05: qty 3.5

## 2013-04-05 MED ORDER — DEXMEDETOMIDINE HCL IN NACL 200 MCG/50ML IV SOLN
INTRAVENOUS | Status: DC | PRN
  Administered 2013-04-05: 0.2 ug/kg/h via INTRAVENOUS

## 2013-04-05 MED ORDER — ONDANSETRON HCL 4 MG/2ML IJ SOLN
4.0000 mg | Freq: Four times a day (QID) | INTRAMUSCULAR | Status: DC | PRN
  Administered 2013-04-06 – 2013-04-08 (×2): 4 mg via INTRAVENOUS
  Filled 2013-04-05 (×2): qty 2

## 2013-04-05 MED ORDER — MIDAZOLAM HCL 2 MG/2ML IJ SOLN
INTRAMUSCULAR | Status: AC
  Administered 2013-04-05: 2 mg via INTRAVENOUS
  Filled 2013-04-05: qty 2

## 2013-04-05 MED ORDER — FAMOTIDINE IN NACL 20-0.9 MG/50ML-% IV SOLN
20.0000 mg | Freq: Two times a day (BID) | INTRAVENOUS | Status: AC
  Administered 2013-04-05: 20 mg via INTRAVENOUS

## 2013-04-05 MED ORDER — ACETAMINOPHEN 160 MG/5ML PO SOLN
650.0000 mg | Freq: Once | ORAL | Status: AC
  Administered 2013-04-05: 650 mg

## 2013-04-05 MED ORDER — OXYCODONE HCL 5 MG PO TABS
5.0000 mg | ORAL_TABLET | ORAL | Status: DC | PRN
  Administered 2013-04-05 (×2): 5 mg via ORAL
  Administered 2013-04-06 – 2013-04-07 (×7): 10 mg via ORAL
  Filled 2013-04-05: qty 2
  Filled 2013-04-05: qty 1
  Filled 2013-04-05 (×6): qty 2
  Filled 2013-04-05: qty 1

## 2013-04-05 MED ORDER — SODIUM CHLORIDE 0.9 % IJ SOLN
3.0000 mL | INTRAMUSCULAR | Status: DC | PRN
  Administered 2013-04-09: 3 mL via INTRAVENOUS

## 2013-04-05 MED ORDER — GLYCOPYRROLATE 0.2 MG/ML IJ SOLN
INTRAMUSCULAR | Status: AC
  Filled 2013-04-05: qty 1

## 2013-04-05 MED ORDER — SUFENTANIL CITRATE 250 MCG/5ML IV SOLN
INTRAVENOUS | Status: AC
  Filled 2013-04-05: qty 5

## 2013-04-05 MED ORDER — LACTATED RINGERS IV SOLN: INTRAVENOUS | Status: DC

## 2013-04-05 MED ORDER — MIDAZOLAM HCL 5 MG/5ML IJ SOLN
INTRAMUSCULAR | Status: DC | PRN
  Administered 2013-04-05 (×2): 2 mg via INTRAVENOUS
  Administered 2013-04-05 (×2): 3 mg via INTRAVENOUS
  Administered 2013-04-05: 2 mg via INTRAVENOUS

## 2013-04-05 MED ORDER — LACTATED RINGERS IV SOLN
INTRAVENOUS | Status: DC | PRN
  Administered 2013-04-05 (×2): via INTRAVENOUS

## 2013-04-05 MED ORDER — LIDOCAINE HCL (CARDIAC) 20 MG/ML IV SOLN
INTRAVENOUS | Status: DC | PRN
  Administered 2013-04-05: 20 mg via INTRAVENOUS

## 2013-04-05 MED ORDER — ACETAMINOPHEN 650 MG RE SUPP: 650.0000 mg | Freq: Once | RECTAL | Status: AC

## 2013-04-05 MED ORDER — SUFENTANIL CITRATE 50 MCG/ML IV SOLN
INTRAVENOUS | Status: DC | PRN
  Administered 2013-04-05: 10 ug via INTRAVENOUS
  Administered 2013-04-05: 20 ug via INTRAVENOUS
  Administered 2013-04-05: 10 ug via INTRAVENOUS
  Administered 2013-04-05: 5 ug via INTRAVENOUS
  Administered 2013-04-05: 20 ug via INTRAVENOUS
  Administered 2013-04-05: 25 ug via INTRAVENOUS
  Administered 2013-04-05: 40 ug via INTRAVENOUS
  Administered 2013-04-05: 10 ug via INTRAVENOUS

## 2013-04-05 MED ORDER — MORPHINE SULFATE 2 MG/ML IJ SOLN
INTRAMUSCULAR | Status: AC
  Administered 2013-04-05: 2 mg via INTRAVENOUS
  Filled 2013-04-05: qty 1

## 2013-04-05 SURGICAL SUPPLY — 155 items
ADAPTER CARDIO PERF ANTE/RETRO (ADAPTER) ×3 IMPLANT
ADH SKN CLS APL DERMABOND .7 (GAUZE/BANDAGES/DRESSINGS) ×2
ADPR PRFSN 84XANTGRD RTRGD (ADAPTER) ×2
APL SKNCLS STERI-STRIP NONHPOA (GAUZE/BANDAGES/DRESSINGS)
APPLIER CLIP 9.375 MED OPEN (MISCELLANEOUS)
APPLIER CLIP 9.375 SM OPEN (CLIP)
APR CLP MED 9.3 20 MLT OPN (MISCELLANEOUS)
APR CLP SM 9.3 20 MLT OPN (CLIP)
ATRICLIP EXCLUSION 45 FLEX HDL (Clip) ×1 IMPLANT
ATTRACTOMAT 16X20 MAGNETIC DRP (DRAPES) ×5 IMPLANT
BAG DECANTER FOR FLEXI CONT (MISCELLANEOUS) ×5 IMPLANT
BANDAGE ELASTIC 4 VELCRO ST LF (GAUZE/BANDAGES/DRESSINGS) ×3 IMPLANT
BANDAGE ELASTIC 6 VELCRO ST LF (GAUZE/BANDAGES/DRESSINGS) ×3 IMPLANT
BANDAGE GAUZE ELAST BULKY 4 IN (GAUZE/BANDAGES/DRESSINGS) ×3 IMPLANT
BASKET HEART (ORDER IN 25'S) (MISCELLANEOUS) ×1
BASKET HEART (ORDER IN 25S) (MISCELLANEOUS) ×2 IMPLANT
BENZOIN TINCTURE PRP APPL 2/3 (GAUZE/BANDAGES/DRESSINGS) ×2 IMPLANT
BLADE STERNUM SYSTEM 6 (BLADE) ×5 IMPLANT
BLADE SURG 11 STRL SS (BLADE) ×4 IMPLANT
BLADE SURG ROTATE 9660 (MISCELLANEOUS) ×1 IMPLANT
CABLE PACING FASLOC BLUE (MISCELLANEOUS) ×1 IMPLANT
CANISTER SUCTION 2500CC (MISCELLANEOUS) ×5 IMPLANT
CANN PRFSN 3/8X14X24FR PCFC (MISCELLANEOUS) ×2
CANN PRFSN 3/8XCNCT ST RT ANG (MISCELLANEOUS)
CANNULA EZ GLIDE AORTIC 21FR (CANNULA) ×3 IMPLANT
CANNULA FEM VENOUS REMOTE 22FR (CANNULA) ×1 IMPLANT
CANNULA GUNDRY RCSP 15FR (MISCELLANEOUS) ×3 IMPLANT
CANNULA PRFSN 3/8X14X24FR PCFC (MISCELLANEOUS) IMPLANT
CANNULA PRFSN 3/8XCNCT RT ANG (MISCELLANEOUS) IMPLANT
CANNULA VEN MTL TIP RT (MISCELLANEOUS) ×3
CANNULA VENOUS LOW PROF 34X46 (CANNULA) ×2 IMPLANT
CANNULA VESSEL 3MM BLUNT TIP (CANNULA) ×1 IMPLANT
CARDIAC SUCTION (MISCELLANEOUS) ×3 IMPLANT
CATH CPB KIT OWEN (MISCELLANEOUS) ×3 IMPLANT
CATH THORACIC 28FR (CATHETERS) IMPLANT
CATH THORACIC 28FR RT ANG (CATHETERS) IMPLANT
CATH THORACIC 36FR (CATHETERS) ×2 IMPLANT
CATH THORACIC 36FR RT ANG (CATHETERS) ×2 IMPLANT
CLAMP ISOLATOR SYNERGY LG (MISCELLANEOUS) ×3 IMPLANT
CLIP APPLIE 9.375 MED OPEN (MISCELLANEOUS) IMPLANT
CLIP APPLIE 9.375 SM OPEN (CLIP) IMPLANT
CLIP FOGARTY SPRING 6M (CLIP) IMPLANT
CLIP RETRACTION 3.0MM CORONARY (MISCELLANEOUS) ×1 IMPLANT
CLIP TI MEDIUM 24 (CLIP) IMPLANT
CLIP TI WIDE RED SMALL 24 (CLIP) IMPLANT
CONN 1/2X1/2X1/2  BEN (MISCELLANEOUS) ×1
CONN 1/2X1/2X1/2 BEN (MISCELLANEOUS) ×2 IMPLANT
CONN 3/8X1/2 ST GISH (MISCELLANEOUS) ×5 IMPLANT
CONN ST 1/4X3/8  BEN (MISCELLANEOUS) ×2
CONN ST 1/4X3/8 BEN (MISCELLANEOUS) IMPLANT
CONN Y 3/8X3/8X3/8  BEN (MISCELLANEOUS) ×1
CONN Y 3/8X3/8X3/8 BEN (MISCELLANEOUS) IMPLANT
COVER SURGICAL LIGHT HANDLE (MISCELLANEOUS) ×6 IMPLANT
CRADLE DONUT ADULT HEAD (MISCELLANEOUS) ×5 IMPLANT
DERMABOND ADVANCED (GAUZE/BANDAGES/DRESSINGS) ×1
DERMABOND ADVANCED .7 DNX12 (GAUZE/BANDAGES/DRESSINGS) IMPLANT
DRAIN CHANNEL 32F RND 10.7 FF (WOUND CARE) ×4 IMPLANT
DRAPE CARDIOVASCULAR INCISE (DRAPES) ×3
DRAPE INCISE IOBAN 66X45 STRL (DRAPES) ×1 IMPLANT
DRAPE SLUSH/WARMER DISC (DRAPES) ×5 IMPLANT
DRAPE SRG 135X102X78XABS (DRAPES) ×2 IMPLANT
DRSG COVADERM 4X14 (GAUZE/BANDAGES/DRESSINGS) ×5 IMPLANT
ELECT BLADE 4.0 EZ CLEAN MEGAD (MISCELLANEOUS) ×3
ELECT REM PT RETURN 9FT ADLT (ELECTROSURGICAL) ×6
ELECTRODE BLDE 4.0 EZ CLN MEGD (MISCELLANEOUS) IMPLANT
ELECTRODE REM PT RTRN 9FT ADLT (ELECTROSURGICAL) ×8 IMPLANT
GLOVE BIO SURGEON STRL SZ 6 (GLOVE) ×3 IMPLANT
GLOVE BIO SURGEON STRL SZ 6.5 (GLOVE) ×2 IMPLANT
GLOVE BIO SURGEON STRL SZ7 (GLOVE) IMPLANT
GLOVE BIO SURGEON STRL SZ7.5 (GLOVE) ×1 IMPLANT
GLOVE BIOGEL PI IND STRL 6 (GLOVE) IMPLANT
GLOVE BIOGEL PI IND STRL 6.5 (GLOVE) IMPLANT
GLOVE BIOGEL PI IND STRL 7.0 (GLOVE) IMPLANT
GLOVE BIOGEL PI INDICATOR 6 (GLOVE) ×1
GLOVE BIOGEL PI INDICATOR 6.5 (GLOVE)
GLOVE BIOGEL PI INDICATOR 7.0 (GLOVE) ×4
GLOVE EUDERMIC 7 POWDERFREE (GLOVE) IMPLANT
GLOVE ORTHO TXT STRL SZ7.5 (GLOVE) ×6 IMPLANT
GOWN STRL REUS W/ TWL LRG LVL3 (GOWN DISPOSABLE) ×16 IMPLANT
GOWN STRL REUS W/TWL LRG LVL3 (GOWN DISPOSABLE) ×30
HEMOSTAT POWDER SURGIFOAM 1G (HEMOSTASIS) ×17 IMPLANT
INSERT FOGARTY 61MM (MISCELLANEOUS) IMPLANT
INSERT FOGARTY XLG (MISCELLANEOUS) ×5 IMPLANT
KIT BASIN OR (CUSTOM PROCEDURE TRAY) ×5 IMPLANT
KIT DILATOR VASC 18G NDL (KITS) ×1 IMPLANT
KIT DRAINAGE VACCUM ASSIST (KITS) ×1 IMPLANT
KIT ROOM TURNOVER OR (KITS) ×5 IMPLANT
KIT SUCTION CATH 14FR (SUCTIONS) ×19 IMPLANT
KIT VASOVIEW W/TROCAR VH 2000 (KITS) ×3 IMPLANT
LEAD PACING MYOCARDI (MISCELLANEOUS) ×3 IMPLANT
LINE VENT (MISCELLANEOUS) ×1 IMPLANT
LOOP VESSEL SUPERMAXI WHITE (MISCELLANEOUS) ×3 IMPLANT
MARKER GRAFT CORONARY BYPASS (MISCELLANEOUS) ×9 IMPLANT
NS IRRIG 1000ML POUR BTL (IV SOLUTION) ×26 IMPLANT
PACK OPEN HEART (CUSTOM PROCEDURE TRAY) ×5 IMPLANT
PAD ARMBOARD 7.5X6 YLW CONV (MISCELLANEOUS) ×7 IMPLANT
PAD ELECT DEFIB RADIOL ZOLL (MISCELLANEOUS) ×3 IMPLANT
PENCIL BUTTON HOLSTER BLD 10FT (ELECTRODE) ×3 IMPLANT
PROBE CRYO2-ABLATION MALLABLE (MISCELLANEOUS) ×1 IMPLANT
PUNCH AORTIC ROTATE 4.0MM (MISCELLANEOUS) IMPLANT
PUNCH AORTIC ROTATE 4.5MM 8IN (MISCELLANEOUS) ×1 IMPLANT
PUNCH AORTIC ROTATE 5MM 8IN (MISCELLANEOUS) IMPLANT
SET CARDIOPLEGIA MPS 5001102 (MISCELLANEOUS) ×1 IMPLANT
SET IRRIG TUBING LAPAROSCOPIC (IRRIGATION / IRRIGATOR) ×2 IMPLANT
SOLUTION ANTI FOG 6CC (MISCELLANEOUS) IMPLANT
SPONGE GAUZE 4X4 12PLY (GAUZE/BANDAGES/DRESSINGS) ×10 IMPLANT
SPONGE INTESTINAL PEANUT (DISPOSABLE) ×1 IMPLANT
SPONGE LAP 18X18 X RAY DECT (DISPOSABLE) IMPLANT
SPONGE LAP 4X18 X RAY DECT (DISPOSABLE) IMPLANT
SUCKER INTRACARDIAC WEIGHTED (SUCKER) ×4 IMPLANT
SUT BONE WAX W31G (SUTURE) ×5 IMPLANT
SUT ETHIBOND 2 0 SH (SUTURE)
SUT ETHIBOND 2 0 SH 36X2 (SUTURE) ×4 IMPLANT
SUT ETHIBOND X763 2 0 SH 1 (SUTURE) ×11 IMPLANT
SUT MNCRL AB 3-0 PS2 18 (SUTURE) ×10 IMPLANT
SUT MNCRL AB 4-0 PS2 18 (SUTURE) IMPLANT
SUT PDS AB 1 CTX 36 (SUTURE) ×10 IMPLANT
SUT PROLENE 2 0 SH DA (SUTURE) IMPLANT
SUT PROLENE 3 0 SH 1 (SUTURE) ×2 IMPLANT
SUT PROLENE 3 0 SH DA (SUTURE) ×4 IMPLANT
SUT PROLENE 3 0 SH1 36 (SUTURE) ×2 IMPLANT
SUT PROLENE 4 0 RB 1 (SUTURE) ×6
SUT PROLENE 4 0 SH DA (SUTURE) ×6 IMPLANT
SUT PROLENE 4-0 RB1 .5 CRCL 36 (SUTURE) ×4 IMPLANT
SUT PROLENE 5 0 C 1 36 (SUTURE) IMPLANT
SUT PROLENE 6 0 C 1 30 (SUTURE) ×1 IMPLANT
SUT PROLENE 7.0 RB 3 (SUTURE) ×9 IMPLANT
SUT PROLENE 8 0 BV175 6 (SUTURE) IMPLANT
SUT PROLENE BLUE 7 0 (SUTURE) ×3 IMPLANT
SUT PROLENE POLY MONO (SUTURE) IMPLANT
SUT SILK  1 MH (SUTURE) ×1
SUT SILK 1 MH (SUTURE) ×4 IMPLANT
SUT STEEL 6MS V (SUTURE) IMPLANT
SUT STEEL STERNAL CCS#1 18IN (SUTURE) IMPLANT
SUT STEEL SZ 6 DBL 3X14 BALL (SUTURE) ×3 IMPLANT
SUT VIC AB 1 CTX 36 (SUTURE)
SUT VIC AB 1 CTX36XBRD ANBCTR (SUTURE) IMPLANT
SUT VIC AB 2-0 CT1 27 (SUTURE) ×3
SUT VIC AB 2-0 CT1 TAPERPNT 27 (SUTURE) IMPLANT
SUT VIC AB 2-0 CTX 27 (SUTURE) IMPLANT
SUT VIC AB 3-0 SH 27 (SUTURE)
SUT VIC AB 3-0 SH 27X BRD (SUTURE) IMPLANT
SUT VIC AB 3-0 X1 27 (SUTURE) ×2 IMPLANT
SUT VICRYL 4-0 PS2 18IN ABS (SUTURE) IMPLANT
SUTURE E-PAK OPEN HEART (SUTURE) ×3 IMPLANT
SYS ATRICLIP LAA EXCLUSION 45 (CLIP) IMPLANT
SYSTEM SAHARA CHEST DRAIN ATS (WOUND CARE) ×5 IMPLANT
TAPE CLOTH SURG 4X10 WHT LF (GAUZE/BANDAGES/DRESSINGS) ×2 IMPLANT
TOWEL OR 17X24 6PK STRL BLUE (TOWEL DISPOSABLE) ×10 IMPLANT
TOWEL OR 17X26 10 PK STRL BLUE (TOWEL DISPOSABLE) ×10 IMPLANT
TRAY FOLEY IC TEMP SENS 14FR (CATHETERS) ×2 IMPLANT
TRAY FOLEY IC TEMP SENS 16FR (CATHETERS) ×3 IMPLANT
TUBING INSUFFLATION 10FT LAP (TUBING) ×3 IMPLANT
UNDERPAD 30X30 INCONTINENT (UNDERPADS AND DIAPERS) ×5 IMPLANT
WATER STERILE IRR 1000ML POUR (IV SOLUTION) ×10 IMPLANT

## 2013-04-05 NOTE — Progress Notes (Signed)
Recruitment maneuver done x's 2 minutes, patient tolerated well. RT will continue to monitor.

## 2013-04-05 NOTE — Progress Notes (Signed)
Utilization Review Completed.Donne Anon T4/01/2013

## 2013-04-05 NOTE — Interval H&P Note (Signed)
History and Physical Interval Note:  04/05/2013 8:03 AM  [name]  has presented today for surgery, with the diagnosis of CAD A-FIB  The various methods of treatment have been discussed with the patient and family. After consideration of risks, benefits and other options for treatment, the patient has consented to  Procedure(s): CORONARY ARTERY BYPASS GRAFTING (CABG) (N/A) MAZE (N/A) INTRAOPERATIVE TRANSESOPHAGEAL ECHOCARDIOGRAM (N/A) as a surgical intervention .  The patient's history has been reviewed, patient examined, no change in status, stable for surgery.  I have reviewed the patient's chart and labs.  Questions were answered to the patient's satisfaction.     Kwan Shellhammer H

## 2013-04-05 NOTE — Progress Notes (Signed)
[facility] BRIEF SICU PROGRESS NOTE  Day of Surgery  S/P Procedure(s) (LRB): CORONARY ARTERY BYPASS GRAFTING (CABG) TIMES TWO USING LEFT INTERNAL MAMMARY ARTERY AND RIGHT SAPHENOUS LEG VEIN HARVESTED ENDOSCOPICALLY (N/A) MAZE (N/A) INTRAOPERATIVE TRANSESOPHAGEAL ECHOCARDIOGRAM (N/A)   Awake and alert on vent, looks like he's nearly ready for extubation AV paced w/ stable hemodynamics on trace dose Neo drip Chest tube output low UOP adequate Labs okay  Plan: Continue routine early postop  Kurt Hoffmeier H 04/05/2013 6:59 PM

## 2013-04-05 NOTE — Anesthesia Procedure Notes (Signed)
Procedure Name: Intubation Date/Time: 04/05/2013 8:53 AM Performed by: Melina Copa, Khori Rosevear R Pre-anesthesia Checklist: Patient identified, Emergency Drugs available, Suction available, Patient being monitored and Timeout performed Patient Re-evaluated:Patient Re-evaluated prior to inductionOxygen Delivery Method: Circle system utilized Preoxygenation: Pre-oxygenation with 100% oxygen Intubation Type: IV induction Ventilation: Mask ventilation with difficulty, Two handed mask ventilation required and Oral airway inserted - appropriate to patient size Grade View: Grade III Tube size: 8.5 mm Number of attempts: 2 Airway Equipment and Method: Rigid stylet and Video-laryngoscopy Placement Confirmation: ETT inserted through vocal cords under direct vision,  positive ETCO2 and breath sounds checked- equal and bilateral Secured at: 23 cm Tube secured with: Tape Dental Injury: Teeth and Oropharynx as per pre-operative assessment  Difficulty Due To: Difficulty was anticipated, Difficult Airway- due to large tongue, Difficult Airway- due to reduced neck mobility, Difficult Airway- due to anterior larynx and Difficult Airway- due to limited oral opening Future Recommendations: Recommend- induction with short-acting agent, and alternative techniques readily available

## 2013-04-05 NOTE — Op Note (Signed)
CARDIOTHORACIC SURGERY OPERATIVE NOTE  Date of Procedure: 04/05/2013  Preoperative Diagnosis:   Left Main Coronary Artery Disease  Recurrent Persistent Atrial Fibrillation  Postoperative Diagnosis:   Same  Procedure:    Coronary Artery Bypass Grafting x 2  Left Internal Mammary Artery to Distal Left Anterior Descending Coronary Artery Saphenous Vein Graft to Ramus Intermediate Branch Coronary Artery Endoscopic Vein Harvest from Right Thigh   Maze Procedure  Complete bilateral atrial lesion set using bipolar radiofrequency and cryothermy ablation Clipping of LA appendage (Atriclip, size 56mm)  Surgeon: Valentina Gu. 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 The patient first began to develop palpitations and chest pain nearly 20 years ago. Initially he was diagnosed with PVCs, and cardiac catheterization performed by Dr. Doreatha Lew revealed no significant coronary artery disease but the presence of a coronary myocardial bridge. Approximately 12 years ago he first developed atrial fibrillation. He underwent DC cardioversion and maintained sinus rhythm for about a year or two, but he subsequently relapsed into atrial fibrillation. He underwent a total of 2 additional cardioversions, each with progressively shorter period of time where he remained in sinus rhythm. Ultimately he was referred to Dr.  Rolland Porter in [place] for possible ablation approximately 8 or 9 years ago. He was started on Tikosyn and did quite well, maintaining sinus rhythm for several years. Ablation was never performed. More recently he has been followed by Dr. Acie Fredrickson. He has always been reluctant to consider long-term anticoagulation with Coumadin. He was treated with Eliquis for a period of time, but he had problems with excessive bleeding from minor cuts and fingersticks for blood glucose monitoring, and he developed rectal bleeding after a colonoscopy. He chronically takes fairly high dose aspirin for arthritis. This past fall the patient began to experience episodes of palpitations with worsening exertional shortness of breath. He has remained in persistent atrial fibrillation since that time. He was seen in followup by Dr. Acie Fredrickson and an echocardiogram was performed demonstrating normal left ventricular systolic function with severe left ventricular hypertrophy and significant diastolic dysfunction. There was no significant valvular disease and no sign of significant dynamic left ventricular output tract obstruction. He was referred to Dr. Rayann Heman to consider atrial fibrillation ablation. However, because of the size of his left atrium and his underlying history, Dr. Rayann Heman is concerned that the likely it likelihood of success with ablation may be relatively low. Furthermore, with concerns regarding the need for long term anticoagulation the possibility of eliminating the left atrial appendage may be of significant benefit. The patient was referred to consider surgical maze procedure and originally seen in consultation on 03/01/2013. Since then he underwent diagnostic cardiac catheterization which revealed left main disease and severe two-vessel coronary artery disease involving high-grade ostial stenosis of the left circumflex coronary artery, a large ramus intermediate branch, and ostial stenosis of the left anterior descending  coronary artery. The patient has been seen in consultation and counseled at length regarding the indications, risks and  potential benefits of surgery.  All questions have been answered, and the patient provides full informed consent for the operation as described.     DETAILS OF THE OPERATIVE PROCEDURE  Preparation:  The patient is brought to the operating room on the above mentioned date and central monitoring was established by the anesthesia team including placement of Swan-Ganz catheter and radial arterial line. The patient is placed in the supine position on the operating table.  Intravenous antibiotics are administered. General endotracheal anesthesia is induced uneventfully. A Foley catheter is placed.  Baseline transesophageal echocardiogram was performed.  Findings were notable for normal LV systolic function with mild LV hypertrophy.  No other significant abnormalities were noted.  The patient's chest, abdomen, both groins, and both lower extremities are prepared and draped in a sterile manner. A time out procedure is performed.   Surgical Approach and Conduit Harvest:  A median sternotomy incision was performed and the left internal mammary artery is dissected from the chest wall and prepared for bypass grafting. The left internal mammary artery is notably good quality conduit. Simultaneously, saphenous vein is obtained from the patient's right thigh using endoscopic vein harvest technique. The saphenous vein is notably good quality conduit. After removal of the saphenous vein, the small surgical incisions in the lower extremity are closed with absorbable suture. Following systemic heparinization, the left internal mammary artery was transected distally noted to have excellent flow.   Extracorporeal Cardiopulmonary Bypass and Myocardial Protection:  The pericardium is opened. The ascending aorta is mildly dilated but otherwise normal in appearance. The right common femoral vein is  cannulated using the Seldinger technique and a guidewire advanced into the right atrium using TEE guidance.  The patient is heparinized systemically and the femoral vein cannulated using a 22 Fr long femoral venous cannula.  The ascending aorta is cannulated for cardiopulmonary bypass.  Adequate heparinization is verified.   A retrograde cardioplegia cannula is placed through the right atrium into the coronary sinus.  The operative field is continuously flooded with carbon dioxide gas.   The entire pre-bypass portion of the operation was notable for stable hemodynamics.  Cardiopulmonary bypass was begun and the surface of the heart is inspected.  Distal target vessels are inspected for coronary artery bypass.  A second venous cannula is placed directly into the superior vena cava.   A cardioplegia cannula is placed in the ascending aorta.  A temperature probe was placed in the interventricular septum.  The patient is cooled to 32C systemic temperature.  The aortic cross clamp is applied and cold blood cardioplegia is delivered initially in an antegrade fashion through the aortic root.   Supplemental cardioplegia is given retrograde through the coronary sinus catheter.  Iced saline slush is applied for topical hypothermia.  The initial cardioplegic arrest is rapid with early diastolic arrest.  Repeat doses of cardioplegia are administered intermittently throughout the entire cross clamp portion of the operation through the aortic root, down subsequently placed vein grafts, and through the coronary sinus catheter in order to maintain completely flat electrocardiogram and septal myocardial temperature below 15C.  Myocardial protection was felt to be excellent.   Coronary Artery Bypass Grafting:   The ramus intermediate branch coronary artery was grafted using a reversed saphenous vein graft in an end-to-side fashion.  At the site of distal anastomosis the target vessel was good quality and measured  approximately 2.0 mm in diameter.  The distal left anterior coronary artery was grafted with the left internal mammary artery in  an end-to-side fashion.  At the site of distal anastomosis the target vessel was good quality and measured approximately 2.0 mm in diameter.   Maze Procedure:  The Atricure Synergy ablation system (bipolar radiofrequency ablation clamp) is used for all radiofrequency ablation lesions for the maze procedure.  The Atricure CryoICE nitrous oxide cryothermy ablation system is utilized for all cryothermy lesions.  The heart is retracted towards the surgeon's side and the left sided pulmonary veins exposed.  An elliptical ablation lesion is created around the base of the left sided pulmonary veins.  A similar elliptical lesion was created around the base of the left atrial appendage.  The left atrial appendage was obliterated by deploying a 45 mm Atricure left atrial appendage clip (Atriclip) across the appendage.  The heart was replaced into the pericardial sac.  A left atriotomy incision was performed through the interatrial groove and extended partially across the back wall of the left atrium after opening the oblique sinus inferiorly.  The floor of the left atrium and the mitral valve were exposed using a self-retaining retractor.   An ablation lesion was placed around the right sided pulmonary veins using the bipolar clamp with one limb of the clamp along the endocardial surface and one along the epicardial surface posteriorly.  A bipolar ablation lesion was placed across the dome of the left atrium from the cephalad apex of the atriotomy incision to reach the cephalad apex of the elliptical lesion around the left sided pulmonary veins.  A similar bipolar lesion was placed across the back wall of the left atrium from the caudad apex of the atriotomy incision to reach the caudad apex of the elliptical lesion around the left sided pulmonary veins, thereby completing a box.   Finally another bipolar lesion was placed across the back wall of the left atrium from the caudad apex of the atriotomy incision towards the posterior mitral valve annulus.  This lesion was completed along the endocardial surface onto the posterior mitral annulus with a 3 minute duration cryothermy lesion, followed by a second cryothermy lesion along the posterior epicardial surface of the left atrium to the coronary sinus.  This completes the entire left side lesion set of the Cox maze procedure.  The left atriotomy incision is closed using a 2 layer closure of running 3-0 Prolene suture after placing a sump drain across the mitral valve to serve as a left ventricular vent.  All proximal vein graft anastomoses were placed directly to the ascending aorta prior to removal of the aortic cross clamp.  The septal myocardial temperature rose rapidly after reperfusion of the left internal mammary artery graft.  One final dose of warm retrograde "hot shot" cardioplegia was administered retrograde through the coronary sinus catheter while all air was evacuated through the aortic root.  The aortic cross clamp was removed after a total cross clamp time of 83 minutes.  The heart began to be spontaneously without any for cardioversion. The retrograde cardioplegia cannula is removed and the small hole in the right atrium is extended for several centimeters towards the lateral wall of the right atrium. The right side lesion set of the Cox maze procedure is performed using the bipolar radiofrequency ablation clamp to create longitudinal lesion extending from the posterior apex of the right atriotomy incision along the lateral wall the right atrium to reach the lateral wall of the superior vena cava. A second lesion is placed in the opposite direction to reach the lateral wall of the inferior vena  cava. A lesion is then placed extending away from the right atriotomy incision along the lateral wall of the right atrium to reach  the apex of the right atrial appendage. One final lesion is performed using cryothermy along the endocardial surface of the right atrium extending from the anterior apex of the right atriotomy incision at the acute margin to reach the tricuspid annulus at the 2 o'clock position. The right atriotomy incision is closed using a 2 layer closure of running 4-0 Prolene suture.   Procedure Completion:  All proximal and distal coronary anastomoses were inspected for hemostasis and appropriate graft orientation. Epicardial pacing wires are fixed to the right ventricular outflow tract and to the right atrial appendage. The patient is rewarmed to 37C temperature. The left ventricular vent is removed. The superior vena cava cannula was removed.  The patient is weaned and disconnected from cardiopulmonary bypass.  The patient's rhythm at separation from bypass was AV paced.  The patient was weaned from cardioplegic bypass without any inotropic support. Total cardiopulmonary bypass time for the operation was 128 minutes.  Followup transesophageal echocardiogram performed after separation from bypass revealed no changes from the preoperative exam.  The aortic cannula was removed uneventfully. Protamine was administered to reverse the anticoagulation. The femoral venous cannula is removed and manual pressure held on the groin for 30 minutes.  The mediastinum and pleural space were inspected for hemostasis and irrigated with saline solution. The mediastinum and the left pleural space were drained using 3 chest tubes placed through separate stab incisions inferiorly.  The soft tissues anterior to the aorta were reapproximated loosely. The sternum is closed with double strength sternal wire. The soft tissues anterior to the sternum were closed in multiple layers and the skin is closed with a running subcuticular skin closure.  The post-bypass portion of the operation was notable for stable rhythm and hemodynamics.  No  blood products were administered during the operation.   Disposition:  The patient tolerated the procedure well and is transported to the surgical intensive care in stable condition. There are no intraoperative complications. All sponge instrument and needle counts are verified correct at completion of the operation.    Valentina Gu. Roxy Manns MD 04/05/2013 2:27 PM

## 2013-04-05 NOTE — Brief Op Note (Signed)
04/05/2013  2:23 PM  PATIENT:  [name]  73 y.o. male  PRE-OPERATIVE DIAGNOSIS:  Corn[place] Artery Disease, Atrial Fibrillation  POST-OPERATIVE DIAGNOSIS:  Cornary Artery Disease, Atrial Fibrillation  PROCEDURE:  Procedure(s): CORONARY ARTERY BYPASS GRAFTING (CABG) TIMES TWO USING LEFT INTERNAL MAMMARY ARTERY AND RIGHT SAPHENOUS LEG VEIN HARVESTED ENDOSCOPICALLY (N/A) MAZE (N/A) INTRAOPERATIVE TRANSESOPHAGEAL ECHOCARDIOGRAM (N/A)  SURGEON:    Rexene Alberts, MD  ASSISTANTS:  John Giovanni, PA-C  ANESTHESIA:   Midge Minium, MD  CROSSCLAMP TIME:   76'  CARDIOPULMONARY BYPASS TIME: 128'  FINDINGS:  Normal LV systolic function  Mild LV hypertrophy  Good quality left internal mammary conduit for grafting  Good quality saphenous vein conduit for grafting  Good quality target vessels for grafting  Maze Procedure  Radiofrequency:  Yes.  Bipolar: Yes.  Cut-and-sew:  No.  Cryo: Yes     Lesions (select all that apply):    1   Pulmonary Vein Isolation,    2   Box Lesion,   3a  Inferior Pulmonary Vein Connecting Lesion,   3b  Superior Pulmonary Vein Connecting Lesion,     4  Posterior Mirtal Annular Line,     6  Mitral Valve Cryo Lesion,     7  LAA Ligation/Removal,     9   Intercaval Line to Tricuspid Annulus ("T" Lesion),    11  Intercaval Line,    13  Tricuspid Cryo Lesion,   15a  RAA Lateral Wall (Short) and   15b  RAA Lateral Wall to "T" Lesion  COMPLICATIONS: None  BASELINE WEIGHT: 90 kg  PATIENT DISPOSITION:   TO SICU IN STABLE CONDITION  [name],[name] 04/05/2013 2:23 PM

## 2013-04-05 NOTE — Anesthesia Preprocedure Evaluation (Addendum)
Anesthesia Evaluation  Patient identified by MRN, date of birth, ID band Patient awake    Reviewed: Allergy & Precautions, H&P , NPO status , Patient's Chart, lab work & pertinent test results, reviewed documented beta blocker date and time   History of Anesthesia Complications Negative for: history of anesthetic complications  Airway Mallampati: III TM Distance: >3 FB Neck ROM: Limited    Dental  (+) Teeth Intact, Dental Advisory Given   Pulmonary shortness of breath, sleep apnea and Continuous Positive Airway Pressure Ventilation , former smoker (quit '83),  breath sounds clear to auscultation        Cardiovascular hypertension, Pt. on medications + angina + CAD (50% LM and 2 vessels dz) and +CHF + dysrhythmias Atrial Fibrillation Rhythm:Irregular Rate:Normal  1/15 ECHO: LVH with EF 75-80%, valves OK   Neuro/Psych negative neurological ROS     GI/Hepatic Neg liver ROS, hiatal hernia, GERD-  Medicated and Controlled,  Endo/Other  diabetes (glu 165), Well Controlled, Type 2, Oral Hypoglycemic Agents  Renal/GU negative Renal ROS     Musculoskeletal   Abdominal   Peds  Hematology   Anesthesia Other Findings   Reproductive/Obstetrics                       Anesthesia Physical Anesthesia Plan  ASA: III  Anesthesia Plan: General   Post-op Pain Management:    Induction: Intravenous  Airway Management Planned: Oral ETT and Video Laryngoscope Planned  Additional Equipment: Arterial line, CVP, PA Cath, TEE and Ultrasound Guidance Line Placement  Intra-op Plan:   Post-operative Plan: Post-operative intubation/ventilation  Informed Consent: I have reviewed the patients History and Physical, chart, labs and discussed the procedure including the risks, benefits and alternatives for the proposed anesthesia with the patient or authorized representative who has indicated his/her understanding and  acceptance.   Dental advisory given  Plan Discussed with: CRNA, Anesthesiologist and Surgeon  Anesthesia Plan Comments: (Plan routine monitors,  A line, PA cath, GETA with TEE and post op ventilation)       Anesthesia Quick Evaluation

## 2013-04-05 NOTE — OR Nursing (Signed)
CHEST INCISION MADE AT [phone]

## 2013-04-05 NOTE — Progress Notes (Signed)
  Echocardiogram Echocardiogram Transesophageal has been performed.  [name] 04/05/2013, 9:29 AM

## 2013-04-05 NOTE — Transfer of Care (Signed)
Immediate Anesthesia Transfer of Care Note  Patient: [name]  Procedure(s) Performed: Procedure(s): CORONARY ARTERY BYPASS GRAFTING (CABG) TIMES TWO USING LEFT INTERNAL MAMMARY ARTERY AND RIGHT SAPHENOUS LEG VEIN HARVESTED ENDOSCOPICALLY (N/A) MAZE (N/A) INTRAOPERATIVE TRANSESOPHAGEAL ECHOCARDIOGRAM (N/A)  Patient Location: ICU  Anesthesia Type:General  Level of Consciousness: unresponsive and Patient remains intubated per anesthesia plan  Airway & Oxygen Therapy: Patient remains intubated per anesthesia plan and Patient placed on Ventilator (see vital sign flow sheet for setting)  Post-op Assessment: Report given to PACU RN and Post -op Vital signs reviewed and stable  Post vital signs: Reviewed and stable  Complications: No apparent anesthesia complications

## 2013-04-05 NOTE — Procedures (Signed)
Extubation Procedure Note  Patient Details:   Name: [name] DOB: 09-17-1940 MRN: [id number]   Airway Documentation:     Evaluation  O2 sats: stable throughout Complications: No apparent complications Patient did tolerate procedure well. Bilateral Breath Sounds: Clear   Yes  NIF -22, VC [place], [place] 04/05/2013, 7:12 PM

## 2013-04-06 ENCOUNTER — Encounter (HOSPITAL_COMMUNITY): Payer: Self-pay | Admitting: Thoracic Surgery (Cardiothoracic Vascular Surgery)

## 2013-04-06 ENCOUNTER — Encounter (INDEPENDENT_AMBULATORY_CARE_PROVIDER_SITE_OTHER): Payer: Medicare Other | Admitting: Ophthalmology

## 2013-04-06 ENCOUNTER — Inpatient Hospital Stay (HOSPITAL_COMMUNITY): Payer: Medicare Other

## 2013-04-06 LAB — BASIC METABOLIC PANEL
BUN: 21 mg/dL (ref 6–23)
CHLORIDE: 103 meq/L (ref 96–112)
CO2: 20 mEq/L (ref 19–32)
Calcium: 7.2 mg/dL — ABNORMAL LOW (ref 8.4–10.5)
Creatinine, Ser: 0.98 mg/dL (ref 0.50–1.35)
GFR calc non Af Amer: 79 mL/min — ABNORMAL LOW (ref >90)
Glucose, Bld: 142 mg/dL — ABNORMAL HIGH (ref 70–99)
Potassium: 4.6 mEq/L (ref 3.7–5.3)
Sodium: 137 mEq/L (ref 137–147)

## 2013-04-06 LAB — CBC
HCT: 33.4 % — ABNORMAL LOW (ref 39.0–52.0)
HEMATOCRIT: 34.8 % — AB (ref 39.0–52.0)
Hemoglobin: 11.4 g/dL — ABNORMAL LOW (ref 13.0–17.0)
Hemoglobin: 11.8 g/dL — ABNORMAL LOW (ref 13.0–17.0)
MCH: 31.7 pg (ref 26.0–34.0)
MCH: 31.7 pg (ref 26.0–34.0)
MCHC: 34.1 g/dL (ref 30.0–36.0)
MCHC: 33.9 g/dL (ref 30.0–36.0)
MCV: 92.8 fL (ref 78.0–100.0)
MCV: 93.5 fL (ref 78.0–100.0)
PLATELETS: 143 10*3/uL — AB (ref 150–400)
Platelets: 138 10*3/uL — ABNORMAL LOW (ref 150–400)
RBC: 3.6 MIL/uL — AB (ref 4.22–5.81)
RBC: 3.72 MIL/uL — ABNORMAL LOW (ref 4.22–5.81)
RDW: 14.2 % (ref 11.5–15.5)
RDW: 14.5 % (ref 11.5–15.5)
WBC: 25.1 10*3/uL — AB (ref 4.0–10.5)
WBC: 29.9 10*3/uL — AB (ref 4.0–10.5)

## 2013-04-06 LAB — MAGNESIUM
MAGNESIUM: 2.6 mg/dL — AB (ref 1.5–2.5)
MAGNESIUM: 2.4 mg/dL (ref 1.5–2.5)

## 2013-04-06 LAB — POCT I-STAT 3, ART BLOOD GAS (G3+)
Acid-base deficit: 2 mmol/L (ref 0.0–2.0)
Acid-base deficit: 3 mmol/L — ABNORMAL HIGH (ref 0.0–2.0)
Bicarbonate: 23.2 mEq/L (ref 20.0–24.0)
Bicarbonate: 21.7 mEq/L (ref 20.0–24.0)
O2 Saturation: 87 %
O2 Saturation: 92 %
PCO2 ART: 39.2 mmHg (ref 35.0–45.0)
PCO2 ART: 38.1 mmHg (ref 35.0–45.0)
PH ART: 7.379 (ref 7.350–7.450)
PH ART: 7.362 (ref 7.350–7.450)
PO2 ART: 53 mmHg — AB (ref 80.0–100.0)
PO2 ART: 63 mmHg — AB (ref 80.0–100.0)
Patient temperature: 36.9
Patient temperature: 98
TCO2: 24 mmol/L (ref 0–100)
TCO2: 23 mmol/L (ref 0–100)

## 2013-04-06 LAB — CREATININE, SERUM
CREATININE: 1.18 mg/dL (ref 0.50–1.35)
GFR, EST AFRICAN AMERICAN: 68 mL/min — AB (ref >90)
GFR, EST NON AFRICAN AMERICAN: 59 mL/min — AB (ref >90)

## 2013-04-06 LAB — GLUCOSE, CAPILLARY
GLUCOSE-CAPILLARY: 109 mg/dL — AB (ref 70–99)
GLUCOSE-CAPILLARY: 115 mg/dL — AB (ref 70–99)
GLUCOSE-CAPILLARY: 123 mg/dL — AB (ref 70–99)
GLUCOSE-CAPILLARY: 83 mg/dL (ref 70–99)
Glucose-Capillary: 83 mg/dL (ref 70–99)
Glucose-Capillary: 85 mg/dL (ref 70–99)
Glucose-Capillary: 95 mg/dL (ref 70–99)
Glucose-Capillary: 117 mg/dL — ABNORMAL HIGH (ref 70–99)
Glucose-Capillary: 129 mg/dL — ABNORMAL HIGH (ref 70–99)
Glucose-Capillary: 106 mg/dL — ABNORMAL HIGH (ref 70–99)
Glucose-Capillary: 86 mg/dL (ref 70–99)
Glucose-Capillary: 102 mg/dL — ABNORMAL HIGH (ref 70–99)
Glucose-Capillary: 110 mg/dL — ABNORMAL HIGH (ref 70–99)
Glucose-Capillary: 137 mg/dL — ABNORMAL HIGH (ref 70–99)
Glucose-Capillary: 167 mg/dL — ABNORMAL HIGH (ref 70–99)
Glucose-Capillary: 106 mg/dL — ABNORMAL HIGH (ref 70–99)

## 2013-04-06 LAB — POCT I-STAT, CHEM 8
BUN: 31 mg/dL — ABNORMAL HIGH (ref 6–23)
CALCIUM ION: 1.06 mmol/L — AB (ref 1.13–1.30)
Chloride: 99 mEq/L (ref 96–112)
Creatinine, Ser: 1.2 mg/dL (ref 0.50–1.35)
Glucose, Bld: 175 mg/dL — ABNORMAL HIGH (ref 70–99)
HCT: 38 % — ABNORMAL LOW (ref 39.0–52.0)
HEMOGLOBIN: 12.9 g/dL — AB (ref 13.0–17.0)
Potassium: 4.9 mEq/L (ref 3.7–5.3)
SODIUM: 130 meq/L — AB (ref 137–147)
TCO2: 21 mmol/L (ref 0–100)

## 2013-04-06 MED ORDER — MORPHINE SULFATE 2 MG/ML IJ SOLN
2.0000 mg | INTRAMUSCULAR | Status: DC | PRN
Start: 2013-04-06 — End: 2013-04-08
  Administered 2013-04-06 (×3): 2 mg via INTRAVENOUS
  Filled 2013-04-06 (×3): qty 1

## 2013-04-06 MED ORDER — SODIUM CHLORIDE 0.45 % IV SOLN
INTRAVENOUS | Status: DC
  Administered 2013-04-06: 20 mL/h via INTRAVENOUS

## 2013-04-06 MED ORDER — WARFARIN SODIUM 2.5 MG PO TABS
2.5000 mg | ORAL_TABLET | Freq: Every day | ORAL | Status: DC
  Administered 2013-04-06 – 2013-04-07 (×2): 2.5 mg via ORAL
  Filled 2013-04-06 (×3): qty 1

## 2013-04-06 MED ORDER — COUMADIN BOOK
Freq: Once | Status: AC
Start: 2013-04-06 — End: 2013-04-06
  Administered 2013-04-06: 09:00:00
  Filled 2013-04-06: qty 1

## 2013-04-06 MED ORDER — INSULIN ASPART 100 UNIT/ML ~~LOC~~ SOLN
0.0000 [IU] | SUBCUTANEOUS | Status: DC
  Administered 2013-04-06: 4 [IU] via SUBCUTANEOUS
  Administered 2013-04-07: 2 [IU] via SUBCUTANEOUS

## 2013-04-06 MED ORDER — ALPRAZOLAM 0.25 MG PO TABS
0.2500 mg | ORAL_TABLET | Freq: Every evening | ORAL | Status: DC | PRN
  Administered 2013-04-06: 1 mg via ORAL
  Filled 2013-04-06: qty 4

## 2013-04-06 MED ORDER — WARFARIN - PHYSICIAN DOSING INPATIENT
Freq: Every day | Status: DC
  Administered 2013-04-10 – 2013-04-11 (×2)

## 2013-04-06 MED ORDER — SERTRALINE HCL 100 MG PO TABS
100.0000 mg | ORAL_TABLET | Freq: Every day | ORAL | Status: DC
  Administered 2013-04-06 – 2013-04-11 (×6): 100 mg via ORAL
  Filled 2013-04-06 (×7): qty 1

## 2013-04-06 MED ORDER — KETOROLAC TROMETHAMINE 30 MG/ML IJ SOLN
INTRAMUSCULAR | Status: AC
  Filled 2013-04-06: qty 1

## 2013-04-06 MED ORDER — INSULIN DETEMIR 100 UNIT/ML ~~LOC~~ SOLN
20.0000 [IU] | Freq: Two times a day (BID) | SUBCUTANEOUS | Status: DC
  Administered 2013-04-06: 20 [IU] via SUBCUTANEOUS
  Filled 2013-04-06 (×4): qty 0.2

## 2013-04-06 MED ORDER — CLONIDINE HCL 0.2 MG PO TABS
0.2000 mg | ORAL_TABLET | Freq: Three times a day (TID) | ORAL | Status: DC
  Administered 2013-04-06 (×2): 0.2 mg via ORAL
  Filled 2013-04-06 (×3): qty 1

## 2013-04-06 MED ORDER — FUROSEMIDE 10 MG/ML IJ SOLN
20.0000 mg | Freq: Four times a day (QID) | INTRAMUSCULAR | Status: AC
  Administered 2013-04-06 (×3): 20 mg via INTRAVENOUS
  Filled 2013-04-06 (×3): qty 2

## 2013-04-06 MED ORDER — AMIODARONE HCL 200 MG PO TABS
200.0000 mg | ORAL_TABLET | Freq: Two times a day (BID) | ORAL | Status: DC
  Administered 2013-04-06 – 2013-04-09 (×7): 200 mg via ORAL
  Filled 2013-04-06 (×8): qty 1

## 2013-04-06 MED ORDER — KETOROLAC TROMETHAMINE 30 MG/ML IJ SOLN
30.0000 mg | INTRAMUSCULAR | Status: AC
  Administered 2013-04-06: 30 mg via INTRAVENOUS

## 2013-04-06 MED ORDER — ATORVASTATIN CALCIUM 20 MG PO TABS
20.0000 mg | ORAL_TABLET | ORAL | Status: DC
  Administered 2013-04-07 – 2013-04-10 (×2): 20 mg via ORAL
  Filled 2013-04-06 (×3): qty 1

## 2013-04-06 MED ORDER — CLONIDINE HCL 0.1 MG PO TABS
0.1000 mg | ORAL_TABLET | Freq: Three times a day (TID) | ORAL | Status: DC
  Administered 2013-04-06 – 2013-04-08 (×4): 0.1 mg via ORAL
  Filled 2013-04-06 (×7): qty 1

## 2013-04-06 MED FILL — Heparin Sodium (Porcine) Inj 1000 Unit/ML: INTRAMUSCULAR | Qty: 60 | Status: AC

## 2013-04-06 MED FILL — Magnesium Sulfate Inj 50%: INTRAMUSCULAR | Qty: 10 | Status: AC

## 2013-04-06 MED FILL — Potassium Chloride Inj 2 mEq/ML: INTRAVENOUS | Qty: 40 | Status: AC

## 2013-04-06 MED FILL — Sodium Bicarbonate IV Soln 8.4%: INTRAVENOUS | Qty: 50 | Status: AC

## 2013-04-06 MED FILL — Heparin Sodium (Porcine) Inj 1000 Unit/ML: INTRAMUSCULAR | Qty: 30 | Status: AC

## 2013-04-06 MED FILL — Sodium Chloride IV Soln 0.9%: INTRAVENOUS | Qty: 3000 | Status: AC

## 2013-04-06 MED FILL — Lidocaine HCl IV Inj 20 MG/ML: INTRAVENOUS | Qty: 5 | Status: AC

## 2013-04-06 MED FILL — Dexmedetomidine HCl IV Soln 200 MCG/2ML: INTRAVENOUS | Qty: 2 | Status: AC

## 2013-04-06 MED FILL — Electrolyte-R (PH 7.4) Solution: INTRAVENOUS | Qty: 1000 | Status: AC

## 2013-04-06 MED FILL — Mannitol IV Soln 20%: INTRAVENOUS | Qty: 500 | Status: AC

## 2013-04-06 NOTE — Progress Notes (Addendum)
      [address]       [place] [address]             [phone]        CARDIOTHORACIC SURGERY PROGRESS NOTE   R1 Day Post-Op Procedure(s) (LRB): CORONARY ARTERY BYPASS GRAFTING (CABG) TIMES TWO USING LEFT INTERNAL MAMMARY ARTERY AND RIGHT SAPHENOUS LEG VEIN HARVESTED ENDOSCOPICALLY (N/A) MAZE (N/A) INTRAOPERATIVE TRANSESOPHAGEAL ECHOCARDIOGRAM (N/A)  Subjective: Looks good.  Feels sore in chest but o/w okay.  No nausea.  Objective: Vital signs: BP Readings from Last 1 Encounters:  04/06/13 97/47   Pulse Readings from Last 1 Encounters:  04/06/13 70   Resp Readings from Last 1 Encounters:  04/06/13 22   Temp Readings from Last 1 Encounters:  04/06/13 98.2 F (36.8 C)     Hemodynamics: PAP: (33-66)/(16-28) 51/20 mmHg CO:  [6.3 L/min-8.5 L/min] 6.3 L/min CI:  [3 L/min/m2-4 L/min/m2] 3 L/min/m2  Physical Exam:  Rhythm:   Junctional 60's,  AAI pacing  Breath sounds: coarse  Heart sounds:  RRR  Incisions:  Dressing dry, intact  Abdomen:  Soft, non-distended, non-tender  Extremities:  Warm, well-perfused    Intake/Output from previous day: 04/01 0701 - 04/02 0700 In: 8245.4 [I.V.:5604.4; Blood:911; NG/GT:80; IV Piggyback:1650] Out: [id number] [Urine:1635; Emesis/NG output:100; Blood:1750; Chest Tube:1060] Intake/Output this shift:    Lab Results:  CBC: Recent Labs  04/05/13 2000 04/05/13 2018 04/06/13 0340  WBC 23.8*  --  25.1*  HGB 11.4* 11.2* 11.4*  HCT 32.9* 33.0* 33.4*  PLT 135*  --  143*    BMET:  Recent Labs  04/03/13 1503  04/05/13 2018 04/06/13 0340  NA 140  < > 143 137  K 4.3  < > 4.6 4.6  CL 104  --  107 103  CO2 20  --   --  20  GLUCOSE 250*  < > 119* 142*  BUN 23  --  23 21  CREATININE 0.85  < > 1.00 0.98  CALCIUM 9.3  --   --  7.2*  < > = values in this interval not displayed.   CBG (last 3)   Recent Labs  04/05/13 2010 04/05/13 2155 04/05/13 2332  GLUCAP 109* 123* 115*    ABG    Component Value  Date/Time   PHART 7.362 04/06/2013 0608   PCO2ART 38.1 04/06/2013 0608   PO2ART 63.0* 04/06/2013 0608   HCO3 21.7 04/06/2013 0608   TCO2 23 04/06/2013 0608   ACIDBASEDEF 3.0* 04/06/2013 0608   O2SAT 92.0 04/06/2013 0608    CXR: Looks good.  Mild bibasilar atelectasis  Assessment/Plan: S/P Procedure(s) (LRB): CORONARY ARTERY BYPASS GRAFTING (CABG) TIMES TWO USING LEFT INTERNAL MAMMARY ARTERY AND RIGHT SAPHENOUS LEG VEIN HARVESTED ENDOSCOPICALLY (N/A) MAZE (N/A) INTRAOPERATIVE TRANSESOPHAGEAL ECHOCARDIOGRAM (N/A)  Overall doing well POD1 Maintaining NSR - accel junctional rhythm, AAI pacing Expected post op acute blood loss anemia, mild, stable Expected post op volume excess, mild Expected post op atelectasis, mild Longstanding hypertension OSA   Mobilize  D/C lines  Leave chest tubes in today  Continue AAI pacing for new  Diuresis  Restart low dose amiodarone  Restart clonidine  Restart valsartan, minoxidil, then amlodipine selectively over the next few days depending on BP  Start coumadin slowly  [name] H 04/06/2013 7:44 AM

## 2013-04-06 NOTE — Anesthesia Postprocedure Evaluation (Signed)
  Anesthesia Post-op Note  Patient: [name]  Procedure(s) Performed: Procedure(s): CORONARY ARTERY BYPASS GRAFTING (CABG) TIMES TWO USING LEFT INTERNAL MAMMARY ARTERY AND RIGHT SAPHENOUS LEG VEIN HARVESTED ENDOSCOPICALLY (N/A) MAZE (N/A) INTRAOPERATIVE TRANSESOPHAGEAL ECHOCARDIOGRAM (N/A)  Patient Location: SICU  Anesthesia Type:General  Level of Consciousness: awake, alert  and oriented  Airway and Oxygen Therapy: Patient Spontanous Breathing and Patient connected to nasal cannula oxygen  Post-op Pain: mild  Post-op Assessment: Post-op Vital signs reviewed, Patient's Cardiovascular Status Stable, Respiratory Function Stable, Patent Airway, No signs of Nausea or vomiting, Adequate PO intake and Pain level controlled  Post-op Vital Signs: Reviewed and stable  Complications: No apparent anesthesia complications

## 2013-04-06 NOTE — Progress Notes (Signed)
Pt noted to be fidgety and restless since shift change.  Pt continuously moving arms above head.  Pt reminded continually to be mindful of cordis in [place].  Responded to IV alarm in pt room reading occluded.  Pt cordis noted to be pulled out and kinked.  Pt VS remained stable and pt not in distress.  Pressure dressing and Vaseline gauze placed over insertion site.  Will continue to monitor.

## 2013-04-06 NOTE — Progress Notes (Signed)
EKG CRITICAL VALUE     12 lead EKG performed.  Critical value noted Mertha Baars, RN notified.   Keyton Bhat L, CCT 04/06/2013 7:53 AM

## 2013-04-06 NOTE — Progress Notes (Signed)
MD made aware that CTs drainage has been 50-75cc/hr, drainage is sero-sang, no changes in consistency or color of drainage.  While rounding at bedside, MD also made aware that patient stated he felt that fingers on the left arm were numb.  Pt has palpable pulses and good circulation. Will monitor

## 2013-04-06 NOTE — Progress Notes (Signed)
CT surgery p.m. Rounds  Patient resting comfortably in bed status post CABG-maze procedure Remains atrially paced for sinus bradycardia underlying rhythm Chest tube output serous-sanguinous 500 cc over 10 hours, evening hematocrit stable Continue current care

## 2013-04-07 ENCOUNTER — Inpatient Hospital Stay (HOSPITAL_COMMUNITY): Payer: Medicare Other

## 2013-04-07 LAB — BASIC METABOLIC PANEL
BUN: 29 mg/dL — AB (ref 6–23)
BUN: 44 mg/dL — AB (ref 6–23)
CALCIUM: 7.4 mg/dL — AB (ref 8.4–10.5)
CHLORIDE: 91 meq/L — AB (ref 96–112)
CO2: 18 meq/L — AB (ref 19–32)
CO2: 18 meq/L — AB (ref 19–32)
CREATININE: 1.41 mg/dL — AB (ref 0.50–1.35)
Calcium: 7.4 mg/dL — ABNORMAL LOW (ref 8.4–10.5)
Chloride: 90 mEq/L — ABNORMAL LOW (ref 96–112)
Creatinine, Ser: 1.72 mg/dL — ABNORMAL HIGH (ref 0.50–1.35)
GFR calc Af Amer: 43 mL/min — ABNORMAL LOW (ref >90)
GFR calc non Af Amer: 48 mL/min — ABNORMAL LOW (ref >90)
GFR, EST AFRICAN AMERICAN: 55 mL/min — AB (ref >90)
GFR, EST NON AFRICAN AMERICAN: 37 mL/min — AB (ref >90)
GLUCOSE: 72 mg/dL (ref 70–99)
GLUCOSE: 164 mg/dL — AB (ref 70–99)
Potassium: 5.2 mEq/L (ref 3.7–5.3)
Potassium: 5 mEq/L (ref 3.7–5.3)
Sodium: 125 mEq/L — ABNORMAL LOW (ref 137–147)
Sodium: 124 mEq/L — ABNORMAL LOW (ref 137–147)

## 2013-04-07 LAB — GLUCOSE, CAPILLARY
GLUCOSE-CAPILLARY: 76 mg/dL (ref 70–99)
GLUCOSE-CAPILLARY: 193 mg/dL — AB (ref 70–99)
GLUCOSE-CAPILLARY: 124 mg/dL — AB (ref 70–99)
Glucose-Capillary: 151 mg/dL — ABNORMAL HIGH (ref 70–99)
Glucose-Capillary: 154 mg/dL — ABNORMAL HIGH (ref 70–99)

## 2013-04-07 LAB — CBC
HCT: 36.1 % — ABNORMAL LOW (ref 39.0–52.0)
Hemoglobin: 12.4 g/dL — ABNORMAL LOW (ref 13.0–17.0)
MCH: 32.1 pg (ref 26.0–34.0)
MCHC: 34.3 g/dL (ref 30.0–36.0)
MCV: 93.5 fL (ref 78.0–100.0)
Platelets: 138 10*3/uL — ABNORMAL LOW (ref 150–400)
RBC: 3.86 MIL/uL — ABNORMAL LOW (ref 4.22–5.81)
RDW: 14.5 % (ref 11.5–15.5)
WBC: 28.1 10*3/uL — ABNORMAL HIGH (ref 4.0–10.5)

## 2013-04-07 LAB — PROTIME-INR
INR: 1.47 (ref 0.00–1.49)
Prothrombin Time: 17.4 seconds — ABNORMAL HIGH (ref 11.6–15.2)

## 2013-04-07 MED ORDER — INSULIN DETEMIR 100 UNIT/ML ~~LOC~~ SOLN
15.0000 [IU] | Freq: Two times a day (BID) | SUBCUTANEOUS | Status: DC
  Administered 2013-04-07 – 2013-04-09 (×6): 15 [IU] via SUBCUTANEOUS
  Filled 2013-04-07 (×8): qty 0.15

## 2013-04-07 MED ORDER — FUROSEMIDE 10 MG/ML IJ SOLN
20.0000 mg | Freq: Two times a day (BID) | INTRAMUSCULAR | Status: AC
  Administered 2013-04-07 – 2013-04-08 (×3): 20 mg via INTRAVENOUS
  Filled 2013-04-07 (×3): qty 2

## 2013-04-07 MED ORDER — ALUM HYDROXIDE-MAG TRISILICATE 80-20 MG PO CHEW: 2.0000 | CHEWABLE_TABLET | Freq: Three times a day (TID) | ORAL | Status: DC | PRN

## 2013-04-07 MED ORDER — TRAMADOL HCL 50 MG PO TABS
50.0000 mg | ORAL_TABLET | Freq: Four times a day (QID) | ORAL | Status: DC | PRN
  Administered 2013-04-08 – 2013-04-12 (×4): 50 mg via ORAL
  Filled 2013-04-07 (×4): qty 1

## 2013-04-07 MED ORDER — ALUM & MAG HYDROXIDE-SIMETH 200-200-20 MG/5ML PO SUSP: 30.0000 mL | Freq: Four times a day (QID) | ORAL | Status: DC | PRN

## 2013-04-07 MED ORDER — GLUCERNA SHAKE PO LIQD
237.0000 mL | Freq: Two times a day (BID) | ORAL | Status: DC
  Administered 2013-04-07: 237 mL via ORAL

## 2013-04-07 MED ORDER — OXYCODONE HCL 5 MG PO TABS: 5.0000 mg | ORAL_TABLET | ORAL | Status: DC | PRN

## 2013-04-07 MED ORDER — INSULIN ASPART 100 UNIT/ML ~~LOC~~ SOLN
0.0000 [IU] | Freq: Three times a day (TID) | SUBCUTANEOUS | Status: DC
  Administered 2013-04-07 – 2013-04-08 (×3): 3 [IU] via SUBCUTANEOUS
  Administered 2013-04-09: 5 [IU] via SUBCUTANEOUS
  Administered 2013-04-10: 2 [IU] via SUBCUTANEOUS
  Administered 2013-04-10 – 2013-04-11 (×2): 3 [IU] via SUBCUTANEOUS
  Administered 2013-04-11 – 2013-04-12 (×2): 2 [IU] via SUBCUTANEOUS

## 2013-04-07 NOTE — Progress Notes (Signed)
INITIAL NUTRITION ASSESSMENT  DOCUMENTATION CODES Per approved criteria  -Not Applicable   INTERVENTION: Glucerna Shake po BID, each supplement provides 220 kcal and 10 grams of protein RD to follow for nutrition care plan  NUTRITION DIAGNOSIS: Inadequate oral intake related to poor appetite as evidenced by wife report  Goal: Pt to meet >/= 90% of their estimated nutrition needs   Monitor:  PO & supplemental intake, weight, labs, I/O's  Reason for Assessment: Malnutrition Screening Tool Report  73 y.o. male  Admitting Dx: S/P CABG x 2  ASSESSMENT: Patient with PMH of HTN, CHF, DM; underwent diagnostic cardiac catheterization which revealed left main disease and severe two-vessel coronary artery disease.  Patient sleeping upon RD visit; spoke with wife at bedside; reports pt has a poor appetite, has been eating very little; no recent unintentional weight loss reported; amenable to pt receiving Glucerna Shakes.  No muscle or subcutaneous fat depletion noticed.  Height: Ht Readings from Last 1 Encounters:  04/05/13 5\' 11"  (1.803 m)    Weight: Wt Readings from Last 1 Encounters:  04/07/13 225 lb 15.5 oz (102.5 kg)    Ideal Body Weight: 172 lb  % Ideal Body Weight: 130%  Wt Readings from Last 10 Encounters:  04/07/13 225 lb 15.5 oz (102.5 kg)  04/07/13 225 lb 15.5 oz (102.5 kg)  04/03/13 200 lb (90.719 kg)  03/13/13 201 lb (91.173 kg)  03/07/13 201 lb (91.173 kg)  03/07/13 201 lb (91.173 kg)  03/01/13 201 lb (91.173 kg)  02/27/13 208 lb (94.348 kg)  01/30/13 206 lb 8 oz (93.668 kg)  01/16/13 202 lb (91.627 kg)    Usual Body Weight: 201 lb  % Usual Body Weight: 112%  BMI:  Body mass index is 31.53 kg/(m^2). likely falsely skewed   Estimated Nutritional Needs (based on UBW): Kcal: 2050-2250 Protein: 105-115 gm Fluid: per MD  Skin: chest surgical incision  Diet Order: Carb Control  EDUCATION NEEDS: -No education needs identified at this  time   Intake/Output Summary (Last 24 hours) at 04/07/13 1329 Last data filed at 04/07/13 1200  Gross per 24 hour  Intake    580 ml  Output   1249 ml  Net   -669 ml    Labs:   Recent Labs Lab 04/03/13 1503  04/05/13 2000  04/06/13 0340 04/06/13 1540 04/06/13 1542 04/07/13 0230  NA 140  < >  --   < > 137  --  130* 125*  K 4.3  < >  --   < > 4.6  --  4.9 5.2  CL 104  --   --   < > 103  --  99 91*  CO2 20  --   --   --  20  --   --  18*  BUN 23  --   --   < > 21  --  31* 29*  CREATININE 0.85  --  0.95  < > 0.98 1.18 1.20 1.41*  CALCIUM 9.3  --   --   --  7.2*  --   --  7.4*  MG  --   --  2.9*  --  2.6* 2.4  --   --   GLUCOSE 250*  < >  --   < > 142*  --  175* 72  < > = values in this interval not displayed.  CBG (last 3)   Recent Labs  04/07/13 0349 04/07/13 0736 04/07/13 1142  GLUCAP 76 151* 154*  Scheduled Meds: . acetaminophen  1,000 mg Oral 4 times per day  . amiodarone  200 mg Oral BID  . aspirin EC  325 mg Oral Daily  . atorvastatin  20 mg Oral Once per day on Mon Wed Fri  . bisacodyl  10 mg Oral Daily   Or  . bisacodyl  10 mg Rectal Daily  . cloNIDine  0.1 mg Oral TID  . docusate sodium  200 mg Oral Daily  . furosemide  20 mg Intravenous BID  . insulin aspart  0-15 Units Subcutaneous TID WC  . insulin detemir  15 Units Subcutaneous BID  . insulin regular  0-10 Units Intravenous TID WC  . pantoprazole  40 mg Oral Daily  . sertraline  100 mg Oral QHS  . sodium chloride  3 mL Intravenous Q12H  . warfarin  2.5 mg Oral q1800  . Warfarin - Physician Dosing Inpatient   Does not apply q1800    Continuous Infusions: . sodium chloride 20 mL/hr at 04/07/13 1200    Past Medical History  Diagnosis Date  . Hx of adenomatous colonic polyps   . Diabetes mellitus type II   . Hypertension   . Diverticulosis 2001  . Hyperlipidemia   . Persistent atrial fibrillation   . DJD (degenerative joint disease)   . Depression   . Obstructive sleep apnea      compliant with CPAP  . Chronic diastolic congestive heart failure   . Hypertensive cardiomyopathy   . Coronary-myocardial bridge   . 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 Coronary artery disease 03/07/2013  . History of cardioversion   . Shortness of breath     Hx: of with exertion  . Anxiety   . GERD (gastroesophageal reflux disease)   . H/O hiatal hernia   . Cancer     Basal cell carcinoma  . Adrenal adenoma   . 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 S/P Maze operation for atrial fibrillation 04/05/2013    Complete bilateral atrial lesion set using cryothermy and bipolar radiofrequency ablation with clipping of LA appendage    Past Surgical History  Procedure Laterality Date  . Polinydal cyst      Removed  . Great toe arthrodesis, interphalangeal joint      Right foot  . Retina repair-right    . Cataract extraction      bilateral  . Basal cell carcinoma excision      x3 on face  . Polypectomy    . Cardiac catheterization      myocardial bridge but no cad  . Coronary artery bypass graft N/A 04/05/2013    Procedure: CORONARY ARTERY BYPASS GRAFTING (CABG) TIMES TWO USING LEFT INTERNAL MAMMARY ARTERY AND RIGHT SAPHENOUS LEG VEIN HARVESTED ENDOSCOPICALLY;  Surgeon: Rexene Alberts, MD;  Location: [facility];  Service: Open Heart Surgery;  Laterality: N/A;  . Maze N/A 04/05/2013    Procedure: MAZE;  Surgeon: Rexene Alberts, MD;  Location: [facility];  Service: Open Heart Surgery;  Laterality: N/A;  . Intraoperative transesophageal echocardiogram N/A 04/05/2013    Procedure: INTRAOPERATIVE TRANSESOPHAGEAL ECHOCARDIOGRAM;  Surgeon: Rexene Alberts, MD;  Location: [facility];  Service: Open Heart Surgery;  Laterality: N/A;    Arthur Holms, RD, LDN Pager #: [phone] After-Hours Pager #: [phone]

## 2013-04-07 NOTE — Progress Notes (Signed)
MD at bedside to show which CT to be removed. CT marked with tape and removed per protocol. PT tolerated procedure well.

## 2013-04-07 NOTE — Progress Notes (Addendum)
[address]       [place] [address]             [phone]        2 Days Post-Op Procedure(s) (LRB): CORONARY ARTERY BYPASS GRAFTING (CABG) TIMES TWO USING LEFT INTERNAL MAMMARY ARTERY AND RIGHT SAPHENOUS LEG VEIN HARVESTED ENDOSCOPICALLY (N/A) MAZE (N/A) INTRAOPERATIVE TRANSESOPHAGEAL ECHOCARDIOGRAM (N/A)  Subjective: Patient somewhat somnolent. He is arousable. Just received Oxy.  Objective: Vital signs in last 24 hours: Temp:  [97.9 F (36.6 C)-98.9 F (37.2 C)] 97.9 F (36.6 C) (04/03 0734) Pulse Rate:  [67-80] 80 (04/03 0700) Cardiac Rhythm:  [-] Atrial paced (04/03 0800) Resp:  [15-27] 21 (04/03 0700) BP: (88-134)/(43-95) 117/59 mmHg (04/03 0700) SpO2:  [86 %-96 %] 86 % (04/03 0700) Arterial Line BP: (117-160)/(41-58) 119/49 mmHg (04/02 1500) Weight:  [225 lb 15.5 oz (102.5 kg)] 225 lb 15.5 oz (102.5 kg) (04/03 0600)  Pre op weight 90 kg Current Weight  04/07/13 225 lb 15.5 oz (102.5 kg)    Hemodynamic parameters for last 24 hours: PAP: (55)/(23) 55/23 mmHg  Intake/Output from previous day: 04/02 0701 - 04/03 0700 In: 374.9 [P.O.:9; I.V.:265.9; IV Piggyback:100] Out: 1720 [Urine:865; Chest Tube:855]   Physical Exam:  Cardiovascular: RRR Pulmonary: Diminished at bases; no rales, wheezes, or rhonchi. Abdomen: Soft, non tender, distended, tympanic, sporadic bowel sounds present. Extremities: SCDs Wounds: Dressing is clean and dry.   Neurologic: Grossly intact without focal deficits  Lab Results: CBC: Recent Labs  04/06/13 1540 04/06/13 1542 04/07/13 0230  WBC 29.9*  --  28.1*  HGB 11.8* 12.9* 12.4*  HCT 34.8* 38.0* 36.1*  PLT 138*  --  138*   BMET:  Recent Labs  04/06/13 0340  04/06/13 1542 04/07/13 0230  NA 137  --  130* 125*  K 4.6  --  4.9 5.2  CL 103  --  99 91*  CO2 20  --   --  18*  GLUCOSE 142*  --  175* 72  BUN 21  --  31* 29*  CREATININE 0.98  < > 1.20 1.41*  CALCIUM 7.2*  --   --  7.4*  < > =  values in this interval not displayed.  PT/INR:  Lab Results  Component Value Date   INR 1.47 04/07/2013   INR 1.50* 04/05/2013   INR 1.13 04/03/2013   ABG:  INR: Will add last result for INR, ABG once components are confirmed Will add last 4 CBG results once components are confirmed  Assessment/Plan:  1. CV - AAI @ 70. On Amiodarone 200 bid, Clonidine 0.1 tid, adn Coumadin. INR 1.47. 2.  Pulmonary - Chest tubes with 865 cc last 24 hours. CXR shows patient rotated to the right, no pneumothorax, pulmonary edema and bilateral atelectasis L>R, and small left pleural effusion. Per Dr. Prescott Gum, remove posterior MT only.Encourage incentive spirometer. 3. Volume Overload - Will discuss Lasix as needs but is hyponatremic 4.  Acute blood loss anemia - H and H stable at 12.4 and 36.1 5. Mild thrombocytopenia-platelets stable at 138,000 6. Creatinine slightly increased form 1.2 to 1.41 7. Hyponatremia-sodium 125  8. DM-CBGs 83/76/151. On Insulin. Not restarted on Metformin or Amaryl (as taken pre op) as has elevated creatinine. 9.GI-tolerating diet without abdominal pain,nausea, or emesis. Monitor for ileus.  10.Ambulate at least bid 11. Use Oxy for severe pain and encourage Ultram.  ZIMMERMAN,DONIELLE MPA-C 04/07/2013,8:28 AM  Patient seen and examined, agree with above Requesting gaviscon for  reflux which he takes at home Changed CBG/ SSI to [facility] and HS

## 2013-04-07 NOTE — Progress Notes (Signed)
Patient examined and record reviewed.Hemodynamics stable,labs satisfactory.Patient had stable day.Continue current care. VAN TRIGT III,PETER 04/07/2013

## 2013-04-07 NOTE — Evaluation (Signed)
Physical Therapy Evaluation Patient Details Name: [name] MRN: [id number] DOB: Aug 08, 1940 Today's Date: 04/07/2013   History of Present Illness  Admitted with L main CAD and recurrent afib, s/p CABG and maze procedure  Clinical Impression  Pt admitted with/for CABG/Maze.  Pt currently limited functionally due to the problems listed below.  (see problems list.)  Pt will benefit from PT to maximize function and safety to be able to get home safely with available assist of family.     Follow Up Recommendations Home health PT    Equipment Recommendations  Rolling walker with 5" wheels    Recommendations for Other Services       Precautions / Restrictions Precautions Precautions: Sternal;Fall      Mobility  Bed Mobility                  Transfers Overall transfer level: Needs assistance   Transfers: Sit to/from Stand Sit to Stand: Min assist         General transfer comment: steady assist  Ambulation/Gait Ambulation/Gait assistance: Min assist Ambulation Distance (Feet): 180 Feet Assistive device:  (pushed w/c) Gait Pattern/deviations: Step-through pattern;Scissoring;Drifts right/left Gait velocity: slow   General Gait Details: moderately unsteady gait with some scissoring and wardering with RW and within [employer]            Wheelchair Mobility    Modified Rankin (Stroke Patients Only)       Balance                                             Pertinent Vitals/Pain VSS    Home Living Family/patient expects to be discharged to:: Private residence Living Arrangements: Spouse/significant other Available Help at Discharge: Family Type of Home: House Home Access: Stairs to enter Entrance Stairs-Rails: Psychiatric nurse of Steps: 2 Home Layout: One level Home Equipment: None Additional Comments: grab bars in the shower    Prior Function Level of Independence: Independent               Hand  Dominance        Extremity/Trunk Assessment   Upper Extremity Assessment: Overall WFL for tasks assessed           Lower Extremity Assessment: Overall WFL for tasks assessed;Generalized weakness         Communication   Communication: No difficulties  Cognition Arousal/Alertness: Awake/alert Behavior During Therapy: WFL for tasks assessed/performed Overall Cognitive Status: Within Functional Limits for tasks assessed                      General Comments      Exercises        Assessment/Plan    PT Assessment Patient needs continued PT services  PT Diagnosis Difficulty walking;Generalized weakness;Other (comment) (decr. activity tolerance)   PT Problem List Decreased strength;Decreased activity tolerance;Decreased balance;Decreased mobility;Decreased knowledge of use of DME;Cardiopulmonary status limiting activity;Decreased knowledge of precautions  PT Treatment Interventions DME instruction;Gait training;Stair training;Functional mobility training;Therapeutic activities;Patient/family education   PT Goals (Current goals can be found in the Care Plan section) Acute Rehab PT Goals Patient Stated Goal: pt did not state PT Goal Formulation: With patient Time For Goal Achievement: 04/07/13 Potential to Achieve Goals: Good    Frequency Min 3X/week   Barriers to discharge        Co-evaluation  End of Session Equipment Utilized During Treatment: Oxygen Activity Tolerance: Patient tolerated treatment well Patient left: in chair;with call bell/phone within reach Nurse Communication: Mobility status         Time: [id number] PT Time Calculation (min): 16 min   Charges:   PT Evaluation $Initial PT Evaluation Tier I: 1 Procedure PT Treatments $Gait Training: 8-22 mins   PT G Codes:          Merrissa Giacobbe, Tessie Fass 04/07/2013, 4:43 PM 04/07/2013  Donnella Sham, [place] [phone]  (pager)

## 2013-04-08 ENCOUNTER — Inpatient Hospital Stay (HOSPITAL_COMMUNITY): Payer: Medicare Other

## 2013-04-08 LAB — GLUCOSE, CAPILLARY
GLUCOSE-CAPILLARY: 76 mg/dL (ref 70–99)
GLUCOSE-CAPILLARY: 146 mg/dL — AB (ref 70–99)
Glucose-Capillary: 118 mg/dL — ABNORMAL HIGH (ref 70–99)
Glucose-Capillary: 155 mg/dL — ABNORMAL HIGH (ref 70–99)
Glucose-Capillary: 100 mg/dL — ABNORMAL HIGH (ref 70–99)

## 2013-04-08 LAB — CBC
HEMATOCRIT: 31.9 % — AB (ref 39.0–52.0)
Hemoglobin: 11.4 g/dL — ABNORMAL LOW (ref 13.0–17.0)
MCH: 32.4 pg (ref 26.0–34.0)
MCHC: 35.7 g/dL (ref 30.0–36.0)
MCV: 90.6 fL (ref 78.0–100.0)
PLATELETS: 132 10*3/uL — AB (ref 150–400)
RBC: 3.52 MIL/uL — ABNORMAL LOW (ref 4.22–5.81)
RDW: 13.7 % (ref 11.5–15.5)
WBC: 13.1 10*3/uL — ABNORMAL HIGH (ref 4.0–10.5)

## 2013-04-08 LAB — BASIC METABOLIC PANEL
BUN: 52 mg/dL — AB (ref 6–23)
CALCIUM: 7.7 mg/dL — AB (ref 8.4–10.5)
CO2: 16 mEq/L — ABNORMAL LOW (ref 19–32)
CREATININE: 1.58 mg/dL — AB (ref 0.50–1.35)
Chloride: 90 mEq/L — ABNORMAL LOW (ref 96–112)
GFR, EST AFRICAN AMERICAN: 48 mL/min — AB (ref >90)
GFR, EST NON AFRICAN AMERICAN: 41 mL/min — AB (ref >90)
Glucose, Bld: 116 mg/dL — ABNORMAL HIGH (ref 70–99)
Potassium: 5 mEq/L (ref 3.7–5.3)
Sodium: 123 mEq/L — ABNORMAL LOW (ref 137–147)

## 2013-04-08 LAB — PROTIME-INR
INR: 1.41 (ref 0.00–1.49)
Prothrombin Time: 16.9 seconds — ABNORMAL HIGH (ref 11.6–15.2)

## 2013-04-08 MED ORDER — ENOXAPARIN SODIUM 40 MG/0.4ML ~~LOC~~ SOLN: 40.0000 mg | SUBCUTANEOUS | Status: DC

## 2013-04-08 MED ORDER — LACTULOSE 10 GM/15ML PO SOLN
20.0000 g | Freq: Every day | ORAL | Status: AC
  Administered 2013-04-08: 20 g via ORAL
  Filled 2013-04-08: qty 30

## 2013-04-08 MED ORDER — FUROSEMIDE 10 MG/ML IJ SOLN
20.0000 mg | Freq: Two times a day (BID) | INTRAMUSCULAR | Status: DC
  Administered 2013-04-08 – 2013-04-09 (×2): 20 mg via INTRAVENOUS
  Filled 2013-04-08: qty 2

## 2013-04-08 MED ORDER — WARFARIN SODIUM 4 MG PO TABS
4.0000 mg | ORAL_TABLET | Freq: Every day | ORAL | Status: DC
  Administered 2013-04-08: 4 mg via ORAL
  Filled 2013-04-08 (×2): qty 1

## 2013-04-08 MED ORDER — ENOXAPARIN SODIUM 40 MG/0.4ML ~~LOC~~ SOLN
40.0000 mg | SUBCUTANEOUS | Status: DC
  Administered 2013-04-08 – 2013-04-11 (×4): 40 mg via SUBCUTANEOUS
  Filled 2013-04-08 (×5): qty 0.4

## 2013-04-08 MED ORDER — CLONIDINE HCL 0.1 MG PO TABS
0.1000 mg | ORAL_TABLET | Freq: Two times a day (BID) | ORAL | Status: DC
  Administered 2013-04-08: 0.1 mg via ORAL
  Filled 2013-04-08 (×3): qty 1

## 2013-04-08 NOTE — Progress Notes (Signed)
EKG CRITICAL VALUE     12 lead EKG performed.  Critical value noted.  Ivin Poot, MD notified.   Evalee Mutton, CCT 04/08/2013 10:09 AM

## 2013-04-08 NOTE — Progress Notes (Signed)
3 Days Post-Op Procedure(s) (LRB): CORONARY ARTERY BYPASS GRAFTING (CABG) TIMES TWO USING LEFT INTERNAL MAMMARY ARTERY AND RIGHT SAPHENOUS LEG VEIN HARVESTED ENDOSCOPICALLY (N/A) MAZE (N/A) INTRAOPERATIVE TRANSESOPHAGEAL ECHOCARDIOGRAM (N/A) Subjective: Cabg, maze for a-fib nsr 72 Looks better, CXR improved  Objective: Vital signs in last 24 hours: Temp:  [97.4 F (36.3 C)-99.3 F (37.4 C)] 98.6 F (37 C) (04/04 0817) Pulse Rate:  [66-70] 70 (04/04 0800) Cardiac Rhythm:  [-] Atrial paced (04/04 0800) Resp:  [12-27] 19 (04/04 0800) BP: (86-131)/(45-88) 114/62 mmHg (04/04 0800) SpO2:  [88 %-95 %] 95 % (04/04 0800) Weight:  [223 lb 5.2 oz (101.3 kg)] 223 lb 5.2 oz (101.3 kg) (04/04 0625)  Hemodynamic parameters for last 24 hours:  afebrile  Intake/Output from previous day: 04/03 0701 - 04/04 0700 In: 720 [P.O.:240; I.V.:480] Out: [id number]; Chest Tube:348] Intake/Output this shift: Total I/O In: 20 [I.V.:20] Out: 345 [Urine:325; Chest Tube:20]  Incisions clean Mild edema- cont lasix  Lab Results:  Recent Labs  04/07/13 0230 04/08/13 0317  WBC 28.1* 13.1*  HGB 12.4* 11.4*  HCT 36.1* 31.9*  PLT 138* 132*   BMET:  Recent Labs  04/07/13 1910 04/08/13 0317  NA 124* 123*  K 5.0 5.0  CL 90* 90*  CO2 18* 16*  GLUCOSE 164* 116*  BUN 44* 52*  CREATININE 1.72* 1.58*  CALCIUM 7.4* 7.7*    PT/INR:  Recent Labs  04/08/13 0317  LABPROT 16.9*  INR 1.41   ABG    Component Value Date/Time   PHART 7.362 04/06/2013 0608   HCO3 21.7 04/06/2013 0608   TCO2 21 04/06/2013 1542   ACIDBASEDEF 3.0* 04/06/2013 0608   O2SAT 92.0 04/06/2013 0608   CBG (last 3)   Recent Labs  04/07/13 1752 04/07/13 2159 04/08/13 0815  GLUCAP 193* 124* 118*    Assessment/Plan: S/P Procedure(s) (LRB): CORONARY ARTERY BYPASS GRAFTING (CABG) TIMES TWO USING LEFT INTERNAL MAMMARY ARTERY AND RIGHT SAPHENOUS LEG VEIN HARVESTED ENDOSCOPICALLY (N/A) MAZE (N/A) INTRAOPERATIVE  TRANSESOPHAGEAL ECHOCARDIOGRAM (N/A) DC chest tube Cont coumadin 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

## 2013-04-09 ENCOUNTER — Inpatient Hospital Stay (HOSPITAL_COMMUNITY): Payer: Medicare Other

## 2013-04-09 LAB — COMPREHENSIVE METABOLIC PANEL
ALT: 19 U/L (ref 0–53)
AST: 20 U/L (ref 0–37)
Albumin: 3.1 g/dL — ABNORMAL LOW (ref 3.5–5.2)
Alkaline Phosphatase: 59 U/L (ref 39–117)
BUN: 43 mg/dL — ABNORMAL HIGH (ref 6–23)
CO2: 26 mEq/L (ref 19–32)
Calcium: 8.3 mg/dL — ABNORMAL LOW (ref 8.4–10.5)
Chloride: 98 mEq/L (ref 96–112)
Creatinine, Ser: 1.34 mg/dL (ref 0.50–1.35)
GFR calc Af Amer: 59 mL/min — ABNORMAL LOW (ref >90)
GFR calc non Af Amer: 51 mL/min — ABNORMAL LOW (ref >90)
Glucose, Bld: 108 mg/dL — ABNORMAL HIGH (ref 70–99)
Potassium: 4.5 mEq/L (ref 3.7–5.3)
Sodium: 137 mEq/L (ref 137–147)
Total Bilirubin: 0.6 mg/dL (ref 0.3–1.2)
Total Protein: 6.5 g/dL (ref 6.0–8.3)

## 2013-04-09 LAB — CBC
HCT: 33.6 % — ABNORMAL LOW (ref 39.0–52.0)
Hemoglobin: 11.7 g/dL — ABNORMAL LOW (ref 13.0–17.0)
MCH: 31.9 pg (ref 26.0–34.0)
MCHC: 34.8 g/dL (ref 30.0–36.0)
MCV: 91.6 fL (ref 78.0–100.0)
Platelets: 178 10*3/uL (ref 150–400)
RBC: 3.67 MIL/uL — ABNORMAL LOW (ref 4.22–5.81)
RDW: 14.1 % (ref 11.5–15.5)
WBC: 8.8 10*3/uL (ref 4.0–10.5)

## 2013-04-09 LAB — GLUCOSE, CAPILLARY
GLUCOSE-CAPILLARY: 225 mg/dL — AB (ref 70–99)
GLUCOSE-CAPILLARY: 111 mg/dL — AB (ref 70–99)
GLUCOSE-CAPILLARY: 139 mg/dL — AB (ref 70–99)
Glucose-Capillary: 90 mg/dL (ref 70–99)

## 2013-04-09 LAB — PROTIME-INR
INR: 1.19 (ref 0.00–1.49)
Prothrombin Time: 14.8 seconds (ref 11.6–15.2)

## 2013-04-09 MED ORDER — FUROSEMIDE 40 MG PO TABS
40.0000 mg | ORAL_TABLET | Freq: Every day | ORAL | Status: DC
  Administered 2013-04-10 – 2013-04-12 (×3): 40 mg via ORAL
  Filled 2013-04-09 (×3): qty 1

## 2013-04-09 MED ORDER — AMIODARONE HCL 200 MG PO TABS
400.0000 mg | ORAL_TABLET | Freq: Two times a day (BID) | ORAL | Status: DC
  Administered 2013-04-09 – 2013-04-12 (×6): 400 mg via ORAL
  Filled 2013-04-09 (×7): qty 2

## 2013-04-09 MED ORDER — WARFARIN SODIUM 5 MG PO TABS
5.0000 mg | ORAL_TABLET | Freq: Every day | ORAL | Status: DC
  Administered 2013-04-09 – 2013-04-11 (×3): 5 mg via ORAL
  Filled 2013-04-09 (×4): qty 1

## 2013-04-09 MED ORDER — METOPROLOL TARTRATE 12.5 MG HALF TABLET
12.5000 mg | ORAL_TABLET | Freq: Two times a day (BID) | ORAL | Status: DC
  Administered 2013-04-09 – 2013-04-12 (×7): 12.5 mg via ORAL
  Filled 2013-04-09 (×8): qty 1

## 2013-04-09 MED ORDER — CLONIDINE HCL 0.2 MG PO TABS
0.2000 mg | ORAL_TABLET | Freq: Two times a day (BID) | ORAL | Status: DC
  Administered 2013-04-09 – 2013-04-12 (×7): 0.2 mg via ORAL
  Filled 2013-04-09 (×8): qty 1

## 2013-04-09 MED ORDER — MOVING RIGHT ALONG BOOK
Freq: Once | Status: AC
  Administered 2013-04-09: 17:00:00
  Filled 2013-04-09: qty 1

## 2013-04-09 MED ORDER — IRBESARTAN 75 MG PO TABS: 75.0000 mg | ORAL_TABLET | Freq: Every day | ORAL | Status: DC

## 2013-04-09 MED ORDER — SODIUM CHLORIDE 0.9 % IV SOLN: 250.0000 mL | INTRAVENOUS | Status: DC | PRN

## 2013-04-09 MED ORDER — SODIUM CHLORIDE 0.9 % IJ SOLN
3.0000 mL | Freq: Two times a day (BID) | INTRAMUSCULAR | Status: DC
  Administered 2013-04-09 – 2013-04-12 (×6): 3 mL via INTRAVENOUS

## 2013-04-09 MED ORDER — MAGNESIUM HYDROXIDE 400 MG/5ML PO SUSP: 30.0000 mL | Freq: Every day | ORAL | Status: DC | PRN

## 2013-04-09 MED ORDER — ASPIRIN EC 81 MG PO TBEC
81.0000 mg | DELAYED_RELEASE_TABLET | Freq: Every day | ORAL | Status: DC
  Administered 2013-04-09 – 2013-04-12 (×4): 81 mg via ORAL
  Filled 2013-04-09 (×4): qty 1

## 2013-04-09 MED ORDER — SODIUM CHLORIDE 0.9 % IJ SOLN: 3.0000 mL | INTRAMUSCULAR | Status: DC | PRN

## 2013-04-09 NOTE — Progress Notes (Signed)
Patient transferred to 2w23. Report given to Chilton Greathouse, RN and care assumed. CPAP, glasses, and cell phone transported with patient. Pt's wife at bedside and helped with transferring of pt belongings.

## 2013-04-09 NOTE — Progress Notes (Signed)
RT Note: Pt has home CPAP at bedside. He stated he does not need further assistance from RT. Will continue to monitor

## 2013-04-09 NOTE — Progress Notes (Signed)
4 Days Post-Op Procedure(s) (LRB): CORONARY ARTERY BYPASS GRAFTING (CABG) TIMES TWO USING LEFT INTERNAL MAMMARY ARTERY AND RIGHT SAPHENOUS LEG VEIN HARVESTED ENDOSCOPICALLY (N/A) MAZE (N/A) INTRAOPERATIVE TRANSESOPHAGEAL ECHOCARDIOGRAM (N/A) Subjective: Overall improved- NSR Will DC remaining chestn tube and transfer to stepdown Slowly resume preop BP meds- hold ARB until  Bun, creat starts to decline Coumadin load in progress Objective: Vital signs in last 24 hours: Temp:  [97.6 F (36.4 C)-98.4 F (36.9 C)] 97.6 F (36.4 C) (04/05 0733) Pulse Rate:  [70-90] 76 (04/05 0700) Cardiac Rhythm:  [-] Normal sinus rhythm (04/05 0700) Resp:  [14-28] 26 (04/05 0700) BP: (105-155)/(57-90) 141/74 mmHg (04/05 0700) SpO2:  [91 %-100 %] 96 % (04/05 0700) Weight:  [211 lb 10.3 oz (96 kg)] 211 lb 10.3 oz (96 kg) (04/05 0359)  Hemodynamic parameters for last 24 hours:    Intake/Output from previous day: 04/04 0701 - 04/05 0700 In: 1280 [P.O.:980; I.V.:300] Out: 7190 [Urine:6630; Chest Tube:560] Intake/Output this shift:    Incisions clean  Lab Results:  Recent Labs  04/08/13 0317 04/09/13 0313  WBC 13.1* 8.8  HGB 11.4* 11.7*  HCT 31.9* 33.6*  PLT 132* 178   BMET:  Recent Labs  04/08/13 0317 04/09/13 0313  NA 123* 137  K 5.0 4.5  CL 90* 98  CO2 16* 26  GLUCOSE 116* 108*  BUN 52* 43*  CREATININE 1.58* 1.34  CALCIUM 7.7* 8.3*    PT/INR:  Recent Labs  04/09/13 0313  LABPROT 14.8  INR 1.19   ABG    Component Value Date/Time   PHART 7.362 04/06/2013 0608   HCO3 21.7 04/06/2013 0608   TCO2 21 04/06/2013 1542   ACIDBASEDEF 3.0* 04/06/2013 0608   O2SAT 92.0 04/06/2013 0608   CBG (last 3)   Recent Labs  04/08/13 2154 04/08/13 2255 04/09/13 0730  GLUCAP 76 146* 90    Assessment/Plan: S/P Procedure(s) (LRB): CORONARY ARTERY BYPASS GRAFTING (CABG) TIMES TWO USING LEFT INTERNAL MAMMARY ARTERY AND RIGHT SAPHENOUS LEG VEIN HARVESTED ENDOSCOPICALLY (N/A) MAZE  (N/A) INTRAOPERATIVE TRANSESOPHAGEAL ECHOCARDIOGRAM (N/A) Plan for transfer to step-down: see transfer orders   LOS: 4 days    VAN TRIGT III,Shontavia Mickel 04/09/2013

## 2013-04-10 ENCOUNTER — Inpatient Hospital Stay (HOSPITAL_COMMUNITY): Payer: Medicare Other

## 2013-04-10 LAB — BASIC METABOLIC PANEL
BUN: 29 mg/dL — ABNORMAL HIGH (ref 6–23)
CO2: 26 mEq/L (ref 19–32)
Calcium: 7.8 mg/dL — ABNORMAL LOW (ref 8.4–10.5)
Chloride: 99 mEq/L (ref 96–112)
Creatinine, Ser: 0.97 mg/dL (ref 0.50–1.35)
GFR calc Af Amer: >90 mL/min (ref >90)
GFR calc non Af Amer: 79 mL/min — ABNORMAL LOW (ref >90)
Glucose, Bld: 225 mg/dL — ABNORMAL HIGH (ref 70–99)
Potassium: 4.9 mEq/L (ref 3.7–5.3)
Sodium: 137 mEq/L (ref 137–147)

## 2013-04-10 LAB — GLUCOSE, CAPILLARY
GLUCOSE-CAPILLARY: 122 mg/dL — AB (ref 70–99)
GLUCOSE-CAPILLARY: 76 mg/dL (ref 70–99)
Glucose-Capillary: 207 mg/dL — ABNORMAL HIGH (ref 70–99)
Glucose-Capillary: 148 mg/dL — ABNORMAL HIGH (ref 70–99)

## 2013-04-10 LAB — CBC
HCT: 31.6 % — ABNORMAL LOW (ref 39.0–52.0)
Hemoglobin: 10.7 g/dL — ABNORMAL LOW (ref 13.0–17.0)
MCH: 31.7 pg (ref 26.0–34.0)
MCHC: 33.9 g/dL (ref 30.0–36.0)
MCV: 93.5 fL (ref 78.0–100.0)
Platelets: 200 10*3/uL (ref 150–400)
RBC: 3.38 MIL/uL — ABNORMAL LOW (ref 4.22–5.81)
RDW: 14.1 % (ref 11.5–15.5)
WBC: 9.5 10*3/uL (ref 4.0–10.5)

## 2013-04-10 LAB — PROTIME-INR
INR: 1.38 (ref 0.00–1.49)
Prothrombin Time: 16.6 seconds — ABNORMAL HIGH (ref 11.6–15.2)

## 2013-04-10 MED ORDER — INSULIN DETEMIR 100 UNIT/ML ~~LOC~~ SOLN
15.0000 [IU] | Freq: Every day | SUBCUTANEOUS | Status: DC
  Administered 2013-04-10: 15 [IU] via SUBCUTANEOUS
  Filled 2013-04-10 (×2): qty 0.15

## 2013-04-10 MED ORDER — METFORMIN HCL ER 500 MG PO TB24
1000.0000 mg | ORAL_TABLET | Freq: Two times a day (BID) | ORAL | Status: DC
  Administered 2013-04-10 – 2013-04-12 (×5): 1000 mg via ORAL
  Filled 2013-04-10 (×7): qty 2

## 2013-04-10 MED ORDER — IRBESARTAN 300 MG PO TABS
300.0000 mg | ORAL_TABLET | Freq: Every day | ORAL | Status: DC
  Administered 2013-04-10 – 2013-04-12 (×3): 300 mg via ORAL
  Filled 2013-04-10 (×3): qty 1

## 2013-04-10 NOTE — Progress Notes (Signed)
CARDIAC REHAB PHASE I   PRE:  Rate/Rhythm: 76 Afib  BP:  Supine:   Sitting: 140/70  Standing:    SaO2: 97 3L 95 RA  MODE:  Ambulation: 350 ft   POST:  Rate/Rhythm: 87 Afib  BP:  Supine:   Sitting: 156/80  Standing:    SaO2: 90-91 RA 0905-0940 On arrival pt in recliner on O2 3L sat 97%. O2 discontinued sat on room air 95%. Assisted X 1 and used walker to ambulate. Gait steady with walker. He denies SOB with walking. RA sat in hall 93%. Pt was able to walk 350 feet. RA sat after walk 90-91%. Left pt in recliner after walk on room air. Reported sats to RN. Encouraged use of IS and two more walks today.  Rodney Langton RN 04/10/2013 9:43 AM

## 2013-04-10 NOTE — Progress Notes (Signed)
Physical Therapy Treatment Patient Details Name: [name] MRN: [id number] DOB: [birth year] Today's Date: May 07, 2013    History of Present Illness Admitted with L main CAD and recurrent afib, s/p CABG and maze procedure    PT Comments    Patient ambulating on room air with one standing rest break, increased distance this pm.   Follow Up Recommendations  Home health PT     Equipment Recommendations  Rolling walker with 5" wheels    Recommendations for Other Services       Precautions / Restrictions Precautions Precautions: Sternal;Fall Restrictions Weight Bearing Restrictions: Yes (sternal precautions)    Mobility  Bed Mobility                  Transfers Overall transfer level: Needs assistance Equipment used: Rolling walker (2 wheeled) Transfers: Sit to/from Stand Sit to Stand: Min guard         General transfer comment: Good compliance with sternal precautions  Ambulation/Gait Ambulation/Gait assistance: Supervision;Min guard Ambulation Distance (Feet): 380 Feet Assistive device: Rolling walker (2 wheeled) Gait Pattern/deviations: Step-through pattern;Scissoring;Drifts right/left Gait velocity: improved  Gait velocity interpretation: Below normal speed for age/gender General Gait Details: continues to have some instability with gait, but no noted LOB, patient drifts on Fish farm manager    Modified Rankin (Stroke Patients Only)       Balance                                    Cognition Arousal/Alertness: Awake/alert Behavior During Therapy: WFL for tasks assessed/performed Overall Cognitive Status: Within Functional Limits for tasks assessed                      Exercises      General Comments        Pertinent Vitals/Pain 3/10 pain reported    Home Living                      Prior Function            PT Goals (current goals can now be found in the care  plan section) Acute Rehab PT Goals Patient Stated Goal: pt did not state PT Goal Formulation: With patient Time For Goal Achievement: 04/07/13 Potential to Achieve Goals: Good Progress towards PT goals: Progressing toward goals    Frequency  Min 3X/week    PT Plan Current plan remains appropriate    Co-evaluation             End of Session Equipment Utilized During Treatment: Gait belt Activity Tolerance: Patient tolerated treatment well Patient left: in chair;with call bell/phone within reach     Time: 1346-1404 PT Time Calculation (min): 18 min  Charges:  $Gait Training: 8-22 mins                    G CodesDuncan Dull 05-07-2013, 3:14 PM Alben Deeds, [place] DPT  [phone]

## 2013-04-10 NOTE — Progress Notes (Addendum)
[address]       [place] [address]             [phone]      5 Days Post-Op  Procedure(s) (LRB): CORONARY ARTERY BYPASS GRAFTING (CABG) TIMES TWO USING LEFT INTERNAL MAMMARY ARTERY AND RIGHT SAPHENOUS LEG VEIN HARVESTED ENDOSCOPICALLY (N/A) MAZE (N/A) INTRAOPERATIVE TRANSESOPHAGEAL ECHOCARDIOGRAM (N/A) Subjective: Feels better each day  Objective  Telemetry afib with CVR  Temp:  [97.8 F (36.6 C)-98.5 F (36.9 C)] 98.2 F (36.8 C) (04/06 0406) Pulse Rate:  [65-87] 79 (04/06 0406) Resp:  [16-35] 19 (04/06 0406) BP: (108-165)/(52-91) 160/91 mmHg (04/06 0406) SpO2:  [94 %-99 %] 94 % (04/06 0406) Weight:  [207 lb 8 oz (94.121 kg)] 207 lb 8 oz (94.121 kg) (04/06 0406)   Intake/Output Summary (Last 24 hours) at 04/10/13 0749 Last data filed at 04/10/13 0500  Gross per 24 hour  Intake    580 ml  Output   1535 ml  Net   -955 ml       General appearance: alert, cooperative and no distress Heart: irregularly irregular rhythm Lungs: clear to auscultation bilaterally Abdomen: benign exam Extremities: + BLE edema Wound: incis healing well  Lab Results:  Recent Labs  04/09/13 0313 04/10/13 0521  NA 137 137  K 4.5 4.9  CL 98 99  CO2 26 26  GLUCOSE 108* 225*  BUN 43* 29*  CREATININE 1.34 0.97  CALCIUM 8.3* 7.8*    Recent Labs  04/09/13 0313  AST 20  ALT 19  ALKPHOS 59  BILITOT 0.6  PROT 6.5  ALBUMIN 3.1*   No results found for this basename: LIPASE, AMYLASE,  in the last 72 hours  Recent Labs  04/09/13 0313 04/10/13 0521  WBC 8.8 9.5  HGB 11.7* 10.7*  HCT 33.6* 31.6*  MCV 91.6 93.5  PLT 178 200   No results found for this basename: CKTOTAL, CKMB, TROPONINI,  in the last 72 hours No components found with this basename: POCBNP,  No results found for this basename: DDIMER,  in the last 72 hours No results found for this basename: HGBA1C,  in the last 72 hours No results found for this basename: CHOL, HDL, LDLCALC,  TRIG, CHOLHDL,  in the last 72 hours No results found for this basename: TSH, T4TOTAL, FREET3, T3FREE, THYROIDAB,  in the last 72 hours No results found for this basename: VITAMINB12, FOLATE, FERRITIN, TIBC, IRON, RETICCTPCT,  in the last 72 hours  Medications: Scheduled . acetaminophen  1,000 mg Oral 4 times per day  . amiodarone  400 mg Oral BID  . aspirin EC  81 mg Oral Daily  . atorvastatin  20 mg Oral Once per day on Mon Wed Fri  . bisacodyl  10 mg Oral Daily   Or  . bisacodyl  10 mg Rectal Daily  . cloNIDine  0.2 mg Oral BID  . docusate sodium  200 mg Oral Daily  . enoxaparin (LOVENOX) injection  40 mg Subcutaneous Q24H  . feeding supplement (GLUCERNA SHAKE)  237 mL Oral BID BM  . furosemide  40 mg Oral Daily  . insulin aspart  0-15 Units Subcutaneous TID WC  . insulin detemir  15 Units Subcutaneous BID  . metoprolol tartrate  12.5 mg Oral BID  . pantoprazole  40 mg Oral Daily  . sertraline  100 mg Oral QHS  . sodium chloride  3 mL Intravenous Q12H  . sodium chloride  3 mL Intravenous  Q12H  . warfarin  5 mg Oral q1800  . Warfarin - Physician Dosing Inpatient   Does not apply q1800     Radiology/Studies:  Dg Chest 2 View  04/10/2013   CLINICAL DATA:  Status post CABG.  EXAM: CHEST  2 VIEW  COMPARISON:  DG CHEST 2 VIEW dated 04/09/2013  FINDINGS: The heart is mildly enlarged. Pulmonary vascular congestion is slightly increased. Aeration at the bases is improved. Small bilateral pleural effusions are stable. The patient is status post median sternotomy for CABG and atrial clipping.  IMPRESSION: 1. Slight increase in pulmonary vascular congestion. 2. Aeration at the lung bases is improved. 3. Stable bilateral pleural effusions.   Electronically Signed   By: Lawrence Santiago M.D.   On: 04/10/2013 07:41   Dg Chest 2 View  04/09/2013   CLINICAL DATA:  CABG  EXAM: CHEST  2 VIEW  COMPARISON:  DG CHEST 1V PORT dated 04/08/2013  FINDINGS: Low lung volumes. Stable postsurgical changes within the  mediastinum. Decreased in the left basilar atelectasis. Blunting of the left costophrenic angle appreciated. There is decreased prominence of the interstitial markings. Decreased atelectasis in right middle lobe. A chest tube is appreciated left lung base no evidence for pneumothorax. Osseous structures unremarkable. The cardiac silhouette is enlarged.  IMPRESSION: Stable postsurgical changes.  Improved edema  Improved atelectasis within the left lung base and right middle lobe.   Electronically Signed   By: Margaree Mackintosh M.D.   On: 04/09/2013 07:10    INR:1.38 Will add last result for INR, ABG once components are confirmed Will add last 4 CBG results once components are confirmed  Assessment/Plan: S/P Procedure(s) (LRB): CORONARY ARTERY BYPASS GRAFTING (CABG) TIMES TWO USING LEFT INTERNAL MAMMARY ARTERY AND RIGHT SAPHENOUS LEG VEIN HARVESTED ENDOSCOPICALLY (N/A) MAZE (N/A) INTRAOPERATIVE TRANSESOPHAGEAL ECHOCARDIOGRAM (N/A)  1 feel stronger each day 2 push pulm toilet/rehab, wean O2 off 3 labs stable, creat normal - restart ARB with HTN at times 4 cont coumadin 5 reduce insulin and restart metformin   LOS: 5 days    [name],[name] 4/6/20157:49 AM  On coumadin, still afib I have seen and examined [name] and agree with the above assessment  and plan.  Grace Isaac MD Beeper [phone] Office [phone] 04/10/2013 9:55 AM

## 2013-04-11 LAB — CBC
HCT: 32.7 % — ABNORMAL LOW (ref 39.0–52.0)
Hemoglobin: 11.1 g/dL — ABNORMAL LOW (ref 13.0–17.0)
MCH: 31.7 pg (ref 26.0–34.0)
MCHC: 33.9 g/dL (ref 30.0–36.0)
MCV: 93.4 fL (ref 78.0–100.0)
Platelets: 222 10*3/uL (ref 150–400)
RBC: 3.5 MIL/uL — ABNORMAL LOW (ref 4.22–5.81)
RDW: 14.2 % (ref 11.5–15.5)
WBC: 10.2 10*3/uL (ref 4.0–10.5)

## 2013-04-11 LAB — BASIC METABOLIC PANEL
BUN: 23 mg/dL (ref 6–23)
CO2: 26 mEq/L (ref 19–32)
Calcium: 8.1 mg/dL — ABNORMAL LOW (ref 8.4–10.5)
Chloride: 100 mEq/L (ref 96–112)
Creatinine, Ser: 0.97 mg/dL (ref 0.50–1.35)
GFR calc Af Amer: >90 mL/min (ref >90)
GFR calc non Af Amer: 79 mL/min — ABNORMAL LOW (ref >90)
Glucose, Bld: 163 mg/dL — ABNORMAL HIGH (ref 70–99)
Potassium: 4.5 mEq/L (ref 3.7–5.3)
Sodium: 141 mEq/L (ref 137–147)

## 2013-04-11 LAB — GLUCOSE, CAPILLARY
Glucose-Capillary: 146 mg/dL — ABNORMAL HIGH (ref 70–99)
Glucose-Capillary: 104 mg/dL — ABNORMAL HIGH (ref 70–99)
Glucose-Capillary: 185 mg/dL — ABNORMAL HIGH (ref 70–99)
Glucose-Capillary: 118 mg/dL — ABNORMAL HIGH (ref 70–99)

## 2013-04-11 LAB — PROTIME-INR
INR: 1.39 (ref 0.00–1.49)
Prothrombin Time: 16.7 seconds — ABNORMAL HIGH (ref 11.6–15.2)

## 2013-04-11 MED ORDER — GLIMEPIRIDE 2 MG PO TABS
2.0000 mg | ORAL_TABLET | Freq: Every day | ORAL | Status: DC
  Administered 2013-04-12: 2 mg via ORAL
  Filled 2013-04-11 (×2): qty 1

## 2013-04-11 MED ORDER — OXYCODONE HCL 5 MG PO TABS
5.0000 mg | ORAL_TABLET | ORAL | Status: DC | PRN
  Administered 2013-04-11: 5 mg via ORAL
  Administered 2013-04-11: 10 mg via ORAL
  Administered 2013-04-11: 5 mg via ORAL
  Filled 2013-04-11: qty 1
  Filled 2013-04-11: qty 2
  Filled 2013-04-11: qty 1

## 2013-04-11 NOTE — Progress Notes (Signed)
Patient has home CPAP unit at bedside but unable to set up.  RT removed machine from bag, no frayed wires observed, plugged unit into outlet.  Unit is within reach and patient is able to placed himself on/off as needed.

## 2013-04-11 NOTE — Progress Notes (Signed)
PT Cancellation Note  Patient Details Name: [name] MRN: [id number] DOB: 10/20/1940   Cancelled Treatment:    Reason Eval/Treat Not Completed: Other (comment) Pt has been walking with caridac rehab.  Will see tomorrow for balance eval to see how he does without the walker. Likely ready to d/c from PT   [name] 04/11/2013, 3:19 PM

## 2013-04-11 NOTE — Care Management Note (Unsigned)
    Page 1 of 1   04/11/2013     3:54:52 PM   CARE MANAGEMENT NOTE 04/11/2013  Patient:  [name], [name]   Account Number:  [id number]  Date Initiated:  04/06/2013  Documentation initiated by:  MAYO,HENRIETTA  Subjective/Objective Assessment:   S/P CABG x 2, MAZE; lives with spouse     Action/Plan:   Anticipated DC Date:  04/12/2013   Anticipated DC Plan:  [place]  CM consult      Choice offered to / List presented to:             Status of service:  In process, will continue to follow Medicare Important Message given?   (If response is "NO", the following Medicare IM given date fields will be blank) Date Medicare IM given:   Date Additional Medicare IM given:    Discharge Disposition:    Per UR Regulation:  Reviewed for med. necessity/level of care/duration of stay  If discussed at [place] of Stay Meetings, dates discussed:   04/11/2013    Comments:  04/11/13 Lareen Mullings,RN,BSN [phone] MET WITH PT TO DISCUSS DC NEEDS; PT STATES WIFE WILL PROVIDE 24H CARE AT DC.  HE HAS ACCESS TO RW IF NEEDED. DENIES ANY OTHER HOME NEEDS.

## 2013-04-11 NOTE — Progress Notes (Signed)
EPW removed per protocol. Ends intact. VSS. Pt informed of bedrest for one hour. Verbalized understanding. Will continue to monitor pt closely.

## 2013-04-11 NOTE — Progress Notes (Addendum)
       [address]       [place] [address]             [phone]          6 Days Post-Op Procedure(s) (LRB): CORONARY ARTERY BYPASS GRAFTING (CABG) TIMES TWO USING LEFT INTERNAL MAMMARY ARTERY AND RIGHT SAPHENOUS LEG VEIN HARVESTED ENDOSCOPICALLY (N/A) MAZE (N/A) INTRAOPERATIVE TRANSESOPHAGEAL ECHOCARDIOGRAM (N/A)  Subjective: Just back from walk. Feels well, no complaints.   Objective: Vital signs in last 24 hours: Patient Vitals for the past 24 hrs:  BP Temp Temp src Pulse Resp SpO2 Weight  04/11/13 0513 - - - - - - 203 lb 14.8 oz (92.5 kg)  04/11/13 0500 148/93 mmHg 98.3 F (36.8 C) Oral 76 20 94 % -  04/10/13 2025 159/89 mmHg 99.2 F (37.3 C) Oral 84 20 93 % -  04/10/13 1322 107/72 mmHg 98.4 F (36.9 C) Oral 69 18 95 % -   Current Weight  04/11/13 203 lb 14.8 oz (92.5 kg)  BASELINE WEIGHT: 90 kg    Intake/Output from previous day: 04/06 0701 - 04/07 0700 In: 480 [P.O.:480] Out: 800 [Urine:800]  CBGs [id number]   PHYSICAL EXAM:  Heart: Irr irr Lungs: Clear Wound: Clean and dry Extremities: Mild LE edema, some ecchymosis R thigh    Lab Results: CBC: Recent Labs  04/10/13 0521 04/11/13 0615  WBC 9.5 10.2  HGB 10.7* 11.1*  HCT 31.6* 32.7*  PLT 200 222   BMET:  Recent Labs  04/10/13 0521 04/11/13 0615  NA 137 141  K 4.9 4.5  CL 99 100  CO2 26 26  GLUCOSE 225* 163*  BUN 29* 23  CREATININE 0.97 0.97  CALCIUM 7.8* 8.1*    PT/INR:  Recent Labs  04/11/13 0615  LABPROT 16.7*  INR 1.39      Assessment/Plan: S/P Procedure(s) (LRB): CORONARY ARTERY BYPASS GRAFTING (CABG) TIMES TWO USING LEFT INTERNAL MAMMARY ARTERY AND RIGHT SAPHENOUS LEG VEIN HARVESTED ENDOSCOPICALLY (N/A) MAZE (N/A) INTRAOPERATIVE TRANSESOPHAGEAL ECHOCARDIOGRAM (N/A)  CV- rate controlled AF, BPs trending up.  Continue low dose beta blocker, Clonidine, ARB, will resume Norvasc for HTN.  Vol overload- diurese.  DM- CBGs generally stable,  A1C=6.9 D/c insulin and resume po meds.  CRPI, pulm toilet. Hopefully home in next few days if he remains stable.    LOS: 6 days    COLLINS,GINA H 04/11/2013  In afib, on coumadin Walking better Wires are out Poss d/c tomorrow I have seen and examined [name] and agree with the above assessment  and plan.  Grace Isaac MD Beeper [phone] Office [phone] 04/11/2013 3:31 PM

## 2013-04-11 NOTE — Progress Notes (Signed)
CARDIAC REHAB PHASE I   PRE:  Rate/Rhythm: 84 Afib  BP:  Supine:   Sitting: 126/70  Standing:    SaO2: 94A  MODE:  Ambulation: 590 ft   POST:  Rate/Rhythm: 85 Afib  BP:  Supine:   Sitting: 138/86  Standing:    SaO2: 95 RA 0835-0900 Assisted X1 and used walker to ambulate.Gait steady with walker. VS stable. Pt back to recliner after walk with call light in reach.  Rodney Langton RN 04/11/2013 8:55 AM

## 2013-04-11 NOTE — Discharge Summary (Signed)
[address]       [place] [address]             [phone]              Discharge Summary  Name: [name] DOB: 1940-08-20 73 y.o. MRN: [id number]   Admission Date: 04/05/2013 Discharge Date:     Admitting Diagnosis: Left main coronary artery disease Recurrent persistent atrial fibrillation   Discharge Diagnosis:  Left main coronary artery disease Recurrent persistent atrial fibrillation Expected postoperative blood loss anemia  Past Medical History  Diagnosis Date  . Hx of adenomatous colonic polyps   . Diabetes mellitus type II   . Hypertension   . Diverticulosis 2001  . Hyperlipidemia   . Persistent atrial fibrillation   . DJD (degenerative joint disease)   . Depression   . Obstructive sleep apnea     compliant with CPAP  . Chronic diastolic congestive heart failure   . Hypertensive cardiomyopathy   . Coronary-myocardial bridge   . 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 Coronary artery disease 03/07/2013  . History of cardioversion   . Shortness of breath     Hx: of with exertion  . Anxiety   . GERD (gastroesophageal reflux disease)   . H/O hiatal hernia   . Cancer     Basal cell carcinoma  . Adrenal adenoma   . 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 S/P Maze operation for atrial fibrillation 04/05/2013    Complete bilateral atrial lesion set using cryothermy and bipolar radiofrequency ablation with clipping of LA appendage     Procedures: CORONARY ARTERY BYPASS GRAFTING x 2 (Left internal mammary artery to distal left anterior descending, saphenous vein graft to ramus intermediate) ENDOSCOPIC VEIN HARVEST RIGHT THIGH MAZE PROCEDURE (Complete bilateral atrial lesion set using bipolar radiofrequency and cryothermy ablation) CLIPPING OF LEFT ATRIAL APPENDAGE - 04/05/2013   HPI:  The patient is a 73 y.o. male with long history of atrial  fibrillation, hypertension with hypertensive cardiomyopathy and chronic diastolic congestive heart failure, type 2 diabetes mellitus, hyperlipidemia, and remote history of tobacco use. The patient developed palpitations and chest pain nearly 20 years ago. Initially, he was diagnosed with PVCs, and cardiac catheterization performed by Dr. Doreatha Lew revealed no significant coronary artery disease, but the presence of a coronary myocardial bridge. Approximately 12 years ago, he first developed atrial fibrillation. He underwent DC cardioversion and maintained sinus rhythm for about a year or two, but he subsequently relapsed into atrial fibrillation. He underwent a total of 2 additional cardioversions, each with progressively shorter period of time where he remained in sinus rhythm. Ultimately he was referred to Dr. Rolland Porter in [place] for possible ablation approximately 8 or 9 years ago. He was started on Tikosyn and did quite well, maintaining sinus rhythm for several years. Ablation was never performed. More recently, he has been followed by Dr. Acie Fredrickson. He has always been reluctant to consider long-term anticoagulation with Coumadin. He was treated with Eliquis for a period of time, but he had problems with excessive bleeding from minor cuts and fingersticks for blood glucose monitoring, and he developed rectal bleeding after a colonoscopy. He chronically takes fairly high dose aspirin for arthritis.   This past fall, the patient began to experience episodes of palpitations with worsening exertional shortness of breath. He has remained in persistent atrial  fibrillation since that time. He was seen in followup by Dr. Acie Fredrickson and an echocardiogram was performed demonstrating normal left ventricular systolic function with severe left ventricular hypertrophy and significant diastolic dysfunction. There was no significant valvular disease and no sign of significant dynamic left ventricular output tract obstruction.  He was referred to Dr. Rayann Heman to consider atrial fibrillation ablation. However, because of the size of his left atrium and his underlying history, Dr. Rayann Heman was concerned that the likelihood of success with ablation would be relatively low. Furthermore, with concerns regarding the need for long term anticoagulation, the possibility of eliminating the left atrial appendage may be of significant benefit.  The patient underwent diagnostic cardiac catheterization which revealed left main disease and severe two-vessel coronary artery disease involving high-grade ostial stenosis of the left circumflex coronary artery, a large ramus intermediate branch, and ostial stenosis of the left anterior descending coronary artery. The patient was referred to Dr. Roxy Manns for cardiac surgical evaluation, and it was felt that he would benefit from coronary artery bypass grafting and maze procedure.  All risks, benefits and alternatives of surgery were explained in detail, and the patient agreed to proceed.      Hospital Course:  The patient was admitted to [facility] on 04/05/2013. The patient was taken to the operating room and underwent the above procedure.    The postoperative course was notable early on for bradycardia, which required atrial pacing.  His underlying heart rate did improve, and the pacer was discontinued. He initially was in sinus rhythm, but reverted back to rate controlled atrial fibrillation.  He has had some hypertension, and has been restarted on his home anti-hypertensive medications.    Overall, he has done well postoperatively.  He has been started back on his home diabetes medications, and blood sugars have been fairly well controlled.  Incisions are healing well.  He is ambulating in the halls without difficulty and tolerating a regular diet.  He has been volume overloaded, and was started on Lasix, to which he is responding well.  He has had a mild postoperative anemia, which has not required  transfusion. He remains in rate controlled atrial fibrillation. We anticipate discharge home in 24-48 hours provided no acute changes occur.    Recent vital signs:  Filed Vitals:   04/11/13 0946  BP: 154/78  Pulse: 76  Temp:   Resp:     Recent laboratory studies:  CBC: Recent Labs  04/10/13 0521 04/11/13 0615  WBC 9.5 10.2  HGB 10.7* 11.1*  HCT 31.6* 32.7*  PLT 200 222   BMET:  Recent Labs  04/10/13 0521 04/11/13 0615  NA 137 141  K 4.9 4.5  CL 99 100  CO2 26 26  GLUCOSE 225* 163*  BUN 29* 23  CREATININE 0.97 0.97  CALCIUM 7.8* 8.1*    PT/INR:  Recent Labs  04/11/13 0615  LABPROT 16.7*  INR 1.39     Discharge Medications:     Medication List    STOP taking these medications       amLODipine 10 MG tablet  Commonly known as:  NORVASC     HYDROcodone-acetaminophen 5-500 MG per tablet  Commonly known as:  VICODIN     minoxidil 10 MG tablet  Commonly known as:  LONITEN     nitroGLYCERIN 0.4 MG SL tablet  Commonly known as:  NITROSTAT     potassium chloride SA 20 MEQ tablet  Commonly known as:  K-DUR,KLOR-CON     SUPER STRESS  B-COMPLEX CR PO      TAKE these medications       acetaminophen 500 MG tablet  Commonly known as:  TYLENOL  Take 1,000 mg by mouth every 6 (six) hours as needed for mild pain.     ALPRAZolam 0.25 MG tablet  Commonly known as:  XANAX  Take 0.25-1 mg by mouth at bedtime as needed for sleep.     amiodarone 400 MG tablet  Commonly known as:  PACERONE  Take 1 tablet (400 mg total) by mouth 2 (two) times daily. For 7 days, then take 400 mg dailt     aspirin 81 MG EC tablet  Take 1 tablet (81 mg total) by mouth daily.     BESIVANCE 0.6 % Susp  Generic drug:  Besifloxacin HCl  Place 1 drop into the left eye 4 (four) times daily as needed (after injection). Use in left eye 4 times a day after each injection     CINNAMON PO  Take 2,000 mg by mouth daily.     CLONIDINE HCL PO  Take 0.2 mg by mouth 3 (three) times  daily.     diphenhydrAMINE 25 MG tablet  Commonly known as:  BENADRYL  Take 25 mg by mouth every 6 (six) hours as needed for allergies.     glimepiride 2 MG tablet  Commonly known as:  AMARYL  Take 2 mg by mouth daily before breakfast.     LIPITOR 20 MG tablet  Generic drug:  atorvastatin  Take 20 mg by mouth 3 (three) times a week. Monday Wednesday and Friday     metFORMIN 500 MG 24 hr tablet  Commonly known as:  GLUCOPHAGE-XR  Take 1,000 mg by mouth 2 (two) times daily.     metoprolol tartrate 12.5 mg Tabs tablet  Commonly known as:  LOPRESSOR  Take 0.5 tablets (12.5 mg total) by mouth 2 (two) times daily.     oxyCODONE 5 MG immediate release tablet  Commonly known as:  Oxy IR/ROXICODONE  Take 1-2 tablets (5-10 mg total) by mouth every 4 (four) hours as needed for moderate pain.     PRESERVISION AREDS PO  Take 1 capsule by mouth 2 (two) times daily.     ranitidine 150 MG tablet  Commonly known as:  ZANTAC  Take 150 mg by mouth daily as needed for heartburn.     sertraline 100 MG tablet  Commonly known as:  ZOLOFT  Take 100 mg by mouth daily.     triamterene-hydrochlorothiazide 37.5-25 MG per tablet  Commonly known as:  MAXZIDE-25  Take 1 tablet by mouth daily.     valsartan 320 MG tablet  Commonly known as:  DIOVAN  Take 320 mg by mouth daily.     Vitamin D3 5000 UNITS Caps  Take 5,000 Units by mouth daily.     warfarin 5 MG tablet  Commonly known as:  COUMADIN  Take 1 tablet (5 mg total) by mouth daily at 6 PM.         Discharge Instructions:  The patient is to refrain from driving, heavy lifting or strenuous activity.  May shower daily and clean incisions with soap and water.  May resume regular diet.   Follow Up:       Future Appointments Provider Department Dept Phone   05/26/2013 3:30 PM Thayer Headings, MD [place] [phone]   10/30/2013 2:00 PM [name], MD [facility] [phone]  Follow-up Information   Follow up with Darden Amber., MD.   Specialty:  Cardiology   Contact information:   [address] [place] [address] [phone]       Follow up with Rexene Alberts, MD On 05/01/2013. (Have a chest x-ray at [facility] at 3:30, then see MD at 4:30)    Specialty:  Cardiothoracic Surgery   Contact information:   [place] [address] [phone]       Follow up with [facility] On 04/14/2013. (Please have blood work for Coumadin (PT/INR) drawn )    Specialty:  Cardiology   Contact information:   [address] [place] [address] [phone]     The patient has been discharged on:  1.Beta Blocker: Yes [ x ]  No [ ]   If No, reason:    2.Ace Inhibitor/ARB: Yes [ x ]  No [  ]  If No, reason:    3.Statin: Yes [ x ]  No [ ]   If No, reason:    4.Ecasa: Yes [ x ]  No [ ]   If No, reason:     COLLINS,GINA H 04/11/2013, 11:11 AM

## 2013-04-11 NOTE — Progress Notes (Signed)
CT sutures per removed per order. Benzoin and 1/2" steri strips applied. Pt educated to let steri strips fall off on own. Pt verbalized understanding. Will continue to monitor pt closely.

## 2013-04-11 NOTE — Progress Notes (Signed)
Pt amb 750 ft pushing walker. Pt did not have any complaints. Will continue to monitor pt closely.

## 2013-04-11 NOTE — Progress Notes (Signed)
Spoke with pt regarding his home cpap for bedtime.  Pt stated he can put it on/off himself when ready for bed.  No obvious defects of frays noted on cord.  Pt stated he already had water for his machine when offered.  Pt was advised that RT is available all night should he need further assistance.

## 2013-04-12 LAB — CBC
HCT: 33 % — ABNORMAL LOW (ref 39.0–52.0)
Hemoglobin: 11 g/dL — ABNORMAL LOW (ref 13.0–17.0)
MCH: 31 pg (ref 26.0–34.0)
MCHC: 33.3 g/dL (ref 30.0–36.0)
MCV: 93 fL (ref 78.0–100.0)
Platelets: 236 10*3/uL (ref 150–400)
RBC: 3.55 MIL/uL — ABNORMAL LOW (ref 4.22–5.81)
RDW: 14 % (ref 11.5–15.5)
WBC: 12.5 10*3/uL — ABNORMAL HIGH (ref 4.0–10.5)

## 2013-04-12 LAB — BASIC METABOLIC PANEL
BUN: 21 mg/dL (ref 6–23)
CO2: 27 mEq/L (ref 19–32)
Calcium: 8.5 mg/dL (ref 8.4–10.5)
Chloride: 99 mEq/L (ref 96–112)
Creatinine, Ser: 1.1 mg/dL (ref 0.50–1.35)
GFR calc Af Amer: 74 mL/min — ABNORMAL LOW (ref >90)
GFR calc non Af Amer: 64 mL/min — ABNORMAL LOW (ref >90)
Glucose, Bld: 170 mg/dL — ABNORMAL HIGH (ref 70–99)
Potassium: 4.7 mEq/L (ref 3.7–5.3)
Sodium: 138 mEq/L (ref 137–147)

## 2013-04-12 LAB — GLUCOSE, CAPILLARY: Glucose-Capillary: 136 mg/dL — ABNORMAL HIGH (ref 70–99)

## 2013-04-12 LAB — PROTIME-INR
INR: 1.61 — ABNORMAL HIGH (ref 0.00–1.49)
Prothrombin Time: 18.7 seconds — ABNORMAL HIGH (ref 11.6–15.2)

## 2013-04-12 MED ORDER — ASPIRIN 81 MG PO TBEC: 81.0000 mg | DELAYED_RELEASE_TABLET | Freq: Every day | ORAL | Status: DC

## 2013-04-12 MED ORDER — ACETAMINOPHEN 500 MG PO TABS: 500.0000 mg | ORAL_TABLET | Freq: Four times a day (QID) | ORAL | Status: DC | PRN

## 2013-04-12 MED ORDER — OXYCODONE HCL 5 MG PO TABS: 5.0000 mg | ORAL_TABLET | ORAL | Status: DC | PRN

## 2013-04-12 MED ORDER — WARFARIN SODIUM 5 MG PO TABS: 5.0000 mg | ORAL_TABLET | Freq: Every day | ORAL | Status: DC

## 2013-04-12 MED ORDER — METOPROLOL TARTRATE 12.5 MG HALF TABLET: 12.5000 mg | ORAL_TABLET | Freq: Two times a day (BID) | ORAL | Status: DC

## 2013-04-12 MED ORDER — AMIODARONE HCL 400 MG PO TABS: 400.0000 mg | ORAL_TABLET | Freq: Two times a day (BID) | ORAL | Status: DC

## 2013-04-12 NOTE — Discharge Instructions (Signed)
Endoscopic Saphenous Vein Harvesting °Care After °Refer to this sheet in the next few weeks. These instructions provide you with information on caring for yourself after your procedure. Your caregiver may also give you more specific instructions. Your treatment has been planned according to current medical practices, but problems sometimes occur. Call your caregiver if you have any problems or questions after your procedure. °HOME CARE INSTRUCTIONS °Medicine °· Take whatever pain medicine your surgeon prescribes. Follow the directions carefully. Do not take over-the-counter pain medicine unless your surgeon says it is okay. Some pain medicine can cause bleeding problems for several weeks after surgery. °· Follow your surgeon's instructions about driving. You will probably not be permitted to drive after heart surgery. °· Take any medicines your surgeon prescribes. Any medicines you took before your heart surgery should be checked with your caregiver before you start taking them again. °Wound care °· Ask your surgeon how long you should keep wearing your elastic bandage or stocking. °· Check the area around your surgical cuts (incisions) whenever your bandages (dressings) are changed. Look for any redness or swelling. °· You will need to return to have the stitches (sutures) or staples taken out. Ask your surgeon when to do that. °· Ask your surgeon when you can shower or bathe. °Activity °· Try to keep your legs raised when you are sitting. °· Do any exercises your caregivers have given you. These may include deep breathing exercises, coughing, walking, or other exercises. °SEEK MEDICAL CARE IF: °· You have any questions about your medicines. °· You have more leg pain, especially if your pain medicine stops working. °· New or growing bruises develop on your leg. °· Your leg swells, feels tight, or becomes red. °· You have numbness in your leg. °SEEK IMMEDIATE MEDICAL CARE IF: °· Your pain gets much worse. °· Blood  or fluid leaks from any of the incisions. °· Your incisions become warm, swollen, or red. °· You have chest pain. °· You have trouble breathing. °· You have a fever. °· You have more pain near your leg incision. °MAKE SURE YOU: °· Understand these instructions. °· Will watch your condition. °· Will get help right away if you are not doing well or get worse. °Document Released: 09/03/2010 Document Revised: 03/16/2011 Document Reviewed: 09/03/2010 °ExitCare® Patient Information ©2014 [employer]. °Coronary Artery Bypass Grafting, Care After °Refer to this sheet in the next few weeks. These instructions provide you with information on caring for yourself after your procedure. Your health care provider may also give you more specific instructions. Your treatment has been planned according to current medical practices, but problems sometimes occur. Call your health care provider if you have any problems or questions after your procedure. °WHAT TO EXPECT AFTER THE PROCEDURE °Recovery from surgery will be different for everyone. Some people feel well after 3 or 4 weeks, while for others it takes longer. After your procedure, it is typical to have the following: °· Nausea and a lack of appetite.   °· Constipation. °· Weakness and fatigue.   °· Depression or irritability.   °· Pain or discomfort at your incision site. °HOME CARE INSTRUCTIONS °· Only take over-the-counter or prescription medicines as directed by your health care provider. Take all medicines exactly as directed. Do not stop taking medicines or start any new medicines without first checking with your health care provider.   °· Take your pulse as directed by your health care provider. °· Perform deep breathing as directed by your health care provider. If you were   given a device called an incentive spirometer, use it to practice deep breathing several times a day. Support your chest with a pillow or your arms when you take deep breaths or cough. °· Keep  incision areas clean, dry, and protected. Remove or change any bandages (dressings) only as directed by your health care provider. You may have skin adhesive strips over the incision areas. Do not take the strips off. They will fall off on their own. °· Check incision areas daily for any swelling, redness, or drainage. °· If incisions were made in your legs, do the following: °· Avoid crossing your legs.   °· Avoid sitting for long periods of time. Change positions every 30 minutes.   °· Elevate your legs when you are sitting.   °· Wear compression stockings as directed by your health care provider. These stockings help keep blood clots from forming in your legs. °· Take showers once your health care provider approves. Until then, only take sponge baths. Pat incisions dry. Do not rub incisions with a washcloth or towel. Do not take tub baths or go swimming until your health care provider approves. °· Eat foods that are high in fiber, such as raw fruits and vegetables, whole grains, beans, and nuts. Meats should be lean cut. Avoid canned, processed, and fried foods. °· Drink enough fluids to keep your urine clear or pale yellow. °· Weigh yourself every day. This helps identify if you are retaining fluid that may make your heart and lungs work harder.   °· Rest and limit activity as directed by your health care provider. You may be instructed to: °· Stop any activity at once if you have chest pain, shortness of breath, irregular heartbeats, or dizziness. Get help right away if you have any of these symptoms. °· Move around frequently for short periods or take short walks as directed by your health care provider. Increase your activities gradually. You may need physical therapy or cardiac rehabilitation to help strengthen your muscles and build your endurance. °· Avoid lifting, pushing, or pulling anything heavier than 10 lb (4.5 kg) for at least 6 weeks after surgery. °· Do not drive until your health care provider  approves.  °· Ask your health care provider when you may return to work and resume sexual activity. °· Follow up with your health care provider as directed.   °SEEK MEDICAL CARE IF: °· You have swelling, redness, increasing pain, or drainage at the site of an incision.   °· You develop a fever.   °· You have swelling in your ankles or legs.   °· You have pain in your legs.   °· You have weight gain of 2 or more pounds a day. °· You are nauseous or vomit. °· You have diarrhea.  °SEEK IMMEDIATE MEDICAL CARE IF: °· You have chest pain that goes to your jaw or arms. °· You have shortness of breath.   °· You have a fast or irregular heartbeat.   °· You notice a "clicking" in your breastbone (sternum) when you move.   °· You have numbness or weakness in your arms or legs. °· You feel dizzy or lightheaded.   °MAKE SURE YOU: °· Understand these instructions. °· Will watch your condition. °· Will get help right away if you are not doing well or get worse. °Document Released: 07/11/2004 Document Revised: 08/24/2012 Document Reviewed: 05/31/2012 °ExitCare® Patient Information ©2014 [employer]. ° °

## 2013-04-12 NOTE — Progress Notes (Signed)
Nursing note 04/12/13 Patient given discharge instructions, AVS and paper prescriptions with medication list. All questions were answered will discharge home as ordered. Bettina Gavia Necola Bluestein RN

## 2013-04-12 NOTE — Progress Notes (Addendum)
[id number] Cardiac Rehab Completed discharge education with pt. He voices understanding. Pt agrees to [place]. CRP in [place], will send referral. Placed recovery from heart surgery video on for him to watch. Deon Pilling, RN 04/12/2013 9:30 AM

## 2013-04-12 NOTE — Progress Notes (Addendum)
[address]       [place] [address]             [phone]      7 Days Post-Op  Procedure(s) (LRB): CORONARY ARTERY BYPASS GRAFTING (CABG) TIMES TWO USING LEFT INTERNAL MAMMARY ARTERY AND RIGHT SAPHENOUS LEG VEIN HARVESTED ENDOSCOPICALLY (N/A) MAZE (N/A) INTRAOPERATIVE TRANSESOPHAGEAL ECHOCARDIOGRAM (N/A) Subjective: conts to do well  Objective  Telemetry afib with cvr  Temp:  [98.4 F (36.9 C)-98.7 F (37.1 C)] 98.7 F (37.1 C) (04/08 [id number]) Pulse Rate:  [76-79] 79 (04/08 0613) Resp:  [18] 18 (04/08 0613) BP: (133-155)/(55-88) 155/88 mmHg (04/08 0613) SpO2:  [99 %] 99 % (04/08 0613) Weight:  [203 lb 4.2 oz (92.2 kg)] 203 lb 4.2 oz (92.2 kg) (04/08 0553)   Intake/Output Summary (Last 24 hours) at 04/12/13 0815 Last data filed at 04/11/13 2000  Gross per 24 hour  Intake   1323 ml  Output      0 ml  Net   1323 ml       General appearance: alert, cooperative and no distress Heart: irregularly irregular rhythm Lungs: dim in left base Abdomen: benign Extremities: no sig edema Wound: incis healing well  Lab Results:  Recent Labs  04/11/13 0615 04/12/13 0313  NA 141 138  K 4.5 4.7  CL 100 99  CO2 26 27  GLUCOSE 163* 170*  BUN 23 21  CREATININE 0.97 1.10  CALCIUM 8.1* 8.5   No results found for this basename: AST, ALT, ALKPHOS, BILITOT, PROT, ALBUMIN,  in the last 72 hours No results found for this basename: LIPASE, AMYLASE,  in the last 72 hours  Recent Labs  04/11/13 0615 04/12/13 0313  WBC 10.2 12.5*  HGB 11.1* 11.0*  HCT 32.7* 33.0*  MCV 93.4 93.0  PLT 222 236   No results found for this basename: CKTOTAL, CKMB, TROPONINI,  in the last 72 hours No components found with this basename: POCBNP,  No results found for this basename: DDIMER,  in the last 72 hours No results found for this basename: HGBA1C,  in the last 72 hours No results found for this basename: CHOL, HDL, LDLCALC, TRIG, CHOLHDL,  in the last 72 hours No  results found for this basename: TSH, T4TOTAL, FREET3, T3FREE, THYROIDAB,  in the last 72 hours No results found for this basename: VITAMINB12, FOLATE, FERRITIN, TIBC, IRON, RETICCTPCT,  in the last 72 hours  Medications: Scheduled . amiodarone  400 mg Oral BID  . aspirin EC  81 mg Oral Daily  . atorvastatin  20 mg Oral Once per day on Mon Wed Fri  . bisacodyl  10 mg Oral Daily   Or  . bisacodyl  10 mg Rectal Daily  . cloNIDine  0.2 mg Oral BID  . docusate sodium  200 mg Oral Daily  . enoxaparin (LOVENOX) injection  40 mg Subcutaneous Q24H  . feeding supplement (GLUCERNA SHAKE)  237 mL Oral BID BM  . furosemide  40 mg Oral Daily  . glimepiride  2 mg Oral Q breakfast  . insulin aspart  0-15 Units Subcutaneous TID WC  . irbesartan  300 mg Oral Daily  . metFORMIN  1,000 mg Oral BID WC  . metoprolol tartrate  12.5 mg Oral BID  . pantoprazole  40 mg Oral Daily  . sertraline  100 mg Oral QHS  . sodium chloride  3 mL Intravenous Q12H  . sodium chloride  3 mL Intravenous Q12H  .  warfarin  5 mg Oral q1800  . Warfarin - Physician Dosing Inpatient   Does not apply q1800     Radiology/Studies:  No results found.  INR: Will add last result for INR, ABG once components are confirmed Will add last 4 CBG results once components are confirmed  Assessment/Plan: S/P Procedure(s) (LRB): CORONARY ARTERY BYPASS GRAFTING (CABG) TIMES TWO USING LEFT INTERNAL MAMMARY ARTERY AND RIGHT SAPHENOUS LEG VEIN HARVESTED ENDOSCOPICALLY (N/A) MAZE (N/A) INTRAOPERATIVE TRANSESOPHAGEAL ECHOCARDIOGRAM (N/A)  1 doing well, stable for d/c     LOS: 7 days    [name] 4/8/20158:15 AM  Wants to have INR chescked at primary care office Dr [name], Friday Home today I have seen and examined [name] and agree with the above assessment  and plan.  Cory Isaac MD Beeper [phone] Office [phone] 04/12/2013 10:10 AM

## 2013-04-13 ENCOUNTER — Encounter (HOSPITAL_COMMUNITY): Payer: Self-pay | Admitting: Emergency Medicine

## 2013-04-13 ENCOUNTER — Other Ambulatory Visit: Payer: Self-pay

## 2013-04-13 ENCOUNTER — Observation Stay (HOSPITAL_COMMUNITY)
Admission: EM | Admit: 2013-04-13 | Discharge: 2013-04-14 | Disposition: A | Discharge status: Home / Self Care | Payer: Medicare Other | Attending: Cardiology | Admitting: Cardiology

## 2013-04-13 ENCOUNTER — Emergency Department (HOSPITAL_COMMUNITY): Payer: Medicare Other

## 2013-04-13 DIAGNOSIS — I5033 Acute on chronic diastolic (congestive) heart failure: Secondary | ICD-10-CM

## 2013-04-13 DIAGNOSIS — E119 Type 2 diabetes mellitus without complications: Secondary | ICD-10-CM | POA: Insufficient documentation

## 2013-04-13 DIAGNOSIS — I110 Hypertensive heart disease with heart failure: Principal | ICD-10-CM | POA: Insufficient documentation

## 2013-04-13 DIAGNOSIS — I2510 Atherosclerotic heart disease of native coronary artery without angina pectoris: Secondary | ICD-10-CM | POA: Insufficient documentation

## 2013-04-13 DIAGNOSIS — I509 Heart failure, unspecified: Secondary | ICD-10-CM | POA: Insufficient documentation

## 2013-04-13 DIAGNOSIS — Z7901 Long term (current) use of anticoagulants: Secondary | ICD-10-CM | POA: Insufficient documentation

## 2013-04-13 DIAGNOSIS — Z87891 Personal history of nicotine dependence: Secondary | ICD-10-CM | POA: Insufficient documentation

## 2013-04-13 DIAGNOSIS — I43 Cardiomyopathy in diseases classified elsewhere: Secondary | ICD-10-CM | POA: Insufficient documentation

## 2013-04-13 DIAGNOSIS — E785 Hyperlipidemia, unspecified: Secondary | ICD-10-CM | POA: Diagnosis present

## 2013-04-13 DIAGNOSIS — R0602 Shortness of breath: Secondary | ICD-10-CM

## 2013-04-13 DIAGNOSIS — Z85828 Personal history of other malignant neoplasm of skin: Secondary | ICD-10-CM | POA: Insufficient documentation

## 2013-04-13 DIAGNOSIS — I4891 Unspecified atrial fibrillation: Secondary | ICD-10-CM | POA: Insufficient documentation

## 2013-04-13 DIAGNOSIS — K219 Gastro-esophageal reflux disease without esophagitis: Secondary | ICD-10-CM | POA: Insufficient documentation

## 2013-04-13 DIAGNOSIS — Z7982 Long term (current) use of aspirin: Secondary | ICD-10-CM | POA: Insufficient documentation

## 2013-04-13 DIAGNOSIS — E782 Mixed hyperlipidemia: Secondary | ICD-10-CM | POA: Diagnosis present

## 2013-04-13 DIAGNOSIS — I480 Paroxysmal atrial fibrillation: Secondary | ICD-10-CM | POA: Diagnosis present

## 2013-04-13 DIAGNOSIS — I9589 Other hypotension: Secondary | ICD-10-CM | POA: Insufficient documentation

## 2013-04-13 DIAGNOSIS — I369 Nonrheumatic tricuspid valve disorder, unspecified: Secondary | ICD-10-CM

## 2013-04-13 DIAGNOSIS — Z951 Presence of aortocoronary bypass graft: Secondary | ICD-10-CM

## 2013-04-13 DIAGNOSIS — G4733 Obstructive sleep apnea (adult) (pediatric): Secondary | ICD-10-CM | POA: Insufficient documentation

## 2013-04-13 HISTORY — DX: Atherosclerotic heart disease of native coronary artery without angina pectoris: I25.10

## 2013-04-13 LAB — BASIC METABOLIC PANEL
BUN: 22 mg/dL (ref 6–23)
CALCIUM: 8.5 mg/dL (ref 8.4–10.5)
CO2: 25 meq/L (ref 19–32)
CREATININE: 1.09 mg/dL (ref 0.50–1.35)
Chloride: 99 mEq/L (ref 96–112)
GFR calc Af Amer: 75 mL/min — ABNORMAL LOW (ref >90)
GFR calc non Af Amer: 65 mL/min — ABNORMAL LOW (ref >90)
Glucose, Bld: 168 mg/dL — ABNORMAL HIGH (ref 70–99)
Potassium: 4.1 mEq/L (ref 3.7–5.3)
Sodium: 139 mEq/L (ref 137–147)

## 2013-04-13 LAB — PROTIME-INR
INR: 1.78 — ABNORMAL HIGH (ref 0.00–1.49)
INR: 1.8 — AB (ref 0.00–1.49)
Prothrombin Time: 20.2 seconds — ABNORMAL HIGH (ref 11.6–15.2)
Prothrombin Time: 20.4 seconds — ABNORMAL HIGH (ref 11.6–15.2)

## 2013-04-13 LAB — CBC WITH DIFFERENTIAL/PLATELET
BASOS PCT: 0 % (ref 0–1)
Basophils Absolute: 0 10*3/uL (ref 0.0–0.1)
Eosinophils Absolute: 0.1 10*3/uL (ref 0.0–0.7)
Eosinophils Relative: 1 % (ref 0–5)
HEMATOCRIT: 30.5 % — AB (ref 39.0–52.0)
Hemoglobin: 10.4 g/dL — ABNORMAL LOW (ref 13.0–17.0)
Lymphocytes Relative: 11 % — ABNORMAL LOW (ref 12–46)
Lymphs Abs: 1.3 10*3/uL (ref 0.7–4.0)
MCH: 31.3 pg (ref 26.0–34.0)
MCHC: 34.1 g/dL (ref 30.0–36.0)
MCV: 91.9 fL (ref 78.0–100.0)
MONO ABS: 1.1 10*3/uL — AB (ref 0.1–1.0)
MONOS PCT: 9 % (ref 3–12)
NEUTROS ABS: 10 10*3/uL — AB (ref 1.7–7.7)
Neutrophils Relative %: 79 % — ABNORMAL HIGH (ref 43–77)
Platelets: 243 10*3/uL (ref 150–400)
RBC: 3.32 MIL/uL — ABNORMAL LOW (ref 4.22–5.81)
RDW: 13.9 % (ref 11.5–15.5)
WBC: 12.4 10*3/uL — ABNORMAL HIGH (ref 4.0–10.5)

## 2013-04-13 LAB — GLUCOSE, CAPILLARY
Glucose-Capillary: 142 mg/dL — ABNORMAL HIGH (ref 70–99)
Glucose-Capillary: 143 mg/dL — ABNORMAL HIGH (ref 70–99)
Glucose-Capillary: 136 mg/dL — ABNORMAL HIGH (ref 70–99)
Glucose-Capillary: 98 mg/dL (ref 70–99)

## 2013-04-13 LAB — I-STAT TROPONIN, ED: TROPONIN I, POC: 0.47 ng/mL — AB (ref 0.00–0.08)

## 2013-04-13 LAB — CBC
HEMATOCRIT: 31.3 % — AB (ref 39.0–52.0)
HEMOGLOBIN: 10.5 g/dL — AB (ref 13.0–17.0)
MCH: 31.3 pg (ref 26.0–34.0)
MCHC: 33.5 g/dL (ref 30.0–36.0)
MCV: 93.2 fL (ref 78.0–100.0)
Platelets: 241 10*3/uL (ref 150–400)
RBC: 3.36 MIL/uL — ABNORMAL LOW (ref 4.22–5.81)
RDW: 14.2 % (ref 11.5–15.5)
WBC: 11.9 10*3/uL — ABNORMAL HIGH (ref 4.0–10.5)

## 2013-04-13 LAB — TROPONIN I
TROPONIN I: 0.45 ng/mL — AB (ref <0.30)
TROPONIN I: 0.38 ng/mL — AB (ref <0.30)
Troponin I: 0.41 ng/mL (ref <0.30)
Troponin I: 0.4 ng/mL (ref <0.30)

## 2013-04-13 LAB — PRO B NATRIURETIC PEPTIDE: Pro B Natriuretic peptide (BNP): 2226 pg/mL — ABNORMAL HIGH (ref 0–125)

## 2013-04-13 MED ORDER — ASPIRIN 81 MG PO CHEW: 324.0000 mg | CHEWABLE_TABLET | ORAL | Status: DC

## 2013-04-13 MED ORDER — AMIODARONE HCL 200 MG PO TABS: 400.0000 mg | ORAL_TABLET | Freq: Every day | ORAL | Status: DC

## 2013-04-13 MED ORDER — WARFARIN SODIUM 5 MG PO TABS
5.0000 mg | ORAL_TABLET | Freq: Every day | ORAL | Status: DC
  Administered 2013-04-13: 5 mg via ORAL
  Filled 2013-04-13 (×2): qty 1

## 2013-04-13 MED ORDER — ASPIRIN 300 MG RE SUPP: 300.0000 mg | RECTAL | Status: DC

## 2013-04-13 MED ORDER — CLONIDINE HCL 0.2 MG PO TABS
0.2000 mg | ORAL_TABLET | Freq: Three times a day (TID) | ORAL | Status: DC
  Administered 2013-04-13: 0.2 mg via ORAL
  Filled 2013-04-13 (×7): qty 1

## 2013-04-13 MED ORDER — ACETAMINOPHEN 500 MG PO TABS
1000.0000 mg | ORAL_TABLET | Freq: Four times a day (QID) | ORAL | Status: DC | PRN
  Administered 2013-04-13: 1000 mg via ORAL
  Filled 2013-04-13: qty 2

## 2013-04-13 MED ORDER — INSULIN ASPART 100 UNIT/ML ~~LOC~~ SOLN: 0.0000 [IU] | Freq: Every day | SUBCUTANEOUS | Status: DC

## 2013-04-13 MED ORDER — FUROSEMIDE 10 MG/ML IJ SOLN: 40.0000 mg | Freq: Once | INTRAMUSCULAR | Status: DC

## 2013-04-13 MED ORDER — ASPIRIN 81 MG PO CHEW
162.0000 mg | CHEWABLE_TABLET | Freq: Once | ORAL | Status: AC
  Administered 2013-04-13: 162 mg via ORAL
  Filled 2013-04-13: qty 2

## 2013-04-13 MED ORDER — GLIMEPIRIDE 2 MG PO TABS
2.0000 mg | ORAL_TABLET | Freq: Every day | ORAL | Status: DC
  Administered 2013-04-14: 2 mg via ORAL
  Filled 2013-04-13 (×3): qty 1

## 2013-04-13 MED ORDER — SODIUM CHLORIDE 0.9 % IJ SOLN
3.0000 mL | Freq: Two times a day (BID) | INTRAMUSCULAR | Status: DC
  Administered 2013-04-13 (×2): 3 mL via INTRAVENOUS

## 2013-04-13 MED ORDER — ALPRAZOLAM 0.25 MG PO TABS
0.2500 mg | ORAL_TABLET | Freq: Every evening | ORAL | Status: DC | PRN
  Administered 2013-04-13: 0.25 mg via ORAL
  Filled 2013-04-13: qty 1

## 2013-04-13 MED ORDER — ATORVASTATIN CALCIUM 20 MG PO TABS
20.0000 mg | ORAL_TABLET | ORAL | Status: DC
  Administered 2013-04-14: 20 mg via ORAL
  Filled 2013-04-13: qty 1

## 2013-04-13 MED ORDER — AMIODARONE HCL 200 MG PO TABS
400.0000 mg | ORAL_TABLET | Freq: Two times a day (BID) | ORAL | Status: DC
  Administered 2013-04-13 – 2013-04-14 (×2): 400 mg via ORAL
  Filled 2013-04-13 (×3): qty 2

## 2013-04-13 MED ORDER — METFORMIN HCL ER 500 MG PO TB24
1000.0000 mg | ORAL_TABLET | Freq: Two times a day (BID) | ORAL | Status: DC
  Administered 2013-04-13 (×2): 1000 mg via ORAL
  Filled 2013-04-13 (×5): qty 2

## 2013-04-13 MED ORDER — AMIODARONE HCL 200 MG PO TABS
400.0000 mg | ORAL_TABLET | Freq: Two times a day (BID) | ORAL | Status: DC
  Filled 2013-04-13 (×2): qty 2

## 2013-04-13 MED ORDER — ASPIRIN EC 81 MG PO TBEC
81.0000 mg | DELAYED_RELEASE_TABLET | Freq: Every day | ORAL | Status: DC
  Administered 2013-04-14: 81 mg via ORAL
  Filled 2013-04-13: qty 1

## 2013-04-13 MED ORDER — WARFARIN - PHYSICIAN DOSING INPATIENT: Freq: Every day | Status: DC

## 2013-04-13 MED ORDER — INSULIN ASPART 100 UNIT/ML ~~LOC~~ SOLN
0.0000 [IU] | Freq: Three times a day (TID) | SUBCUTANEOUS | Status: DC
  Administered 2013-04-13 – 2013-04-14 (×4): 2 [IU] via SUBCUTANEOUS

## 2013-04-13 MED ORDER — AMIODARONE HCL 200 MG PO TABS
400.0000 mg | ORAL_TABLET | Freq: Every day | ORAL | Status: DC
  Administered 2013-04-13: 400 mg via ORAL
  Filled 2013-04-13: qty 2

## 2013-04-13 MED ORDER — METOPROLOL TARTRATE 12.5 MG HALF TABLET
12.5000 mg | ORAL_TABLET | Freq: Two times a day (BID) | ORAL | Status: DC
  Administered 2013-04-13 – 2013-04-14 (×3): 12.5 mg via ORAL
  Filled 2013-04-13 (×4): qty 1

## 2013-04-13 MED ORDER — TRIAMTERENE-HCTZ 37.5-25 MG PO TABS
1.0000 | ORAL_TABLET | Freq: Every day | ORAL | Status: DC
  Administered 2013-04-13: 1 via ORAL
  Filled 2013-04-13 (×2): qty 1

## 2013-04-13 MED ORDER — FAMOTIDINE 20 MG PO TABS
20.0000 mg | ORAL_TABLET | Freq: Every day | ORAL | Status: DC
  Administered 2013-04-13 – 2013-04-14 (×2): 20 mg via ORAL
  Filled 2013-04-13 (×2): qty 1

## 2013-04-13 MED ORDER — NITROGLYCERIN 0.4 MG SL SUBL: 0.4000 mg | SUBLINGUAL_TABLET | SUBLINGUAL | Status: DC | PRN

## 2013-04-13 MED ORDER — IRBESARTAN 75 MG PO TABS
75.0000 mg | ORAL_TABLET | Freq: Every day | ORAL | Status: DC
  Administered 2013-04-13 – 2013-04-14 (×2): 75 mg via ORAL
  Filled 2013-04-13 (×2): qty 1

## 2013-04-13 MED ORDER — AMIODARONE HCL 200 MG PO TABS: 400.0000 mg | ORAL_TABLET | Freq: Two times a day (BID) | ORAL | Status: DC

## 2013-04-13 MED ORDER — SERTRALINE HCL 100 MG PO TABS
100.0000 mg | ORAL_TABLET | Freq: Every day | ORAL | Status: DC
  Administered 2013-04-13 – 2013-04-14 (×2): 100 mg via ORAL
  Filled 2013-04-13 (×2): qty 1

## 2013-04-13 NOTE — Progress Notes (Signed)
UR completed 

## 2013-04-13 NOTE — ED Notes (Signed)
Cardiologist at the bedside

## 2013-04-13 NOTE — H&P (Signed)
Cardiology H&P  Primary Care Povider: Alesia Richards, MD Primary Cardiologist: Dr. Acie Fredrickson    HPI: [name] is a 73 y.o.male with hx below relevant for recent CABG + MAZE procedure who presented to the ED approximately 12 hours after about discharged from hospital. His procedure and post-operative course appears to have been uneventful and he has done well. This evening, after feeling well all day, he went to lie down. Upon lying down, he felt short of breath. He was unable to get into a position where his breathing was comfortable and he could sleep. Finally, he decided to present to the ED. In the ED, his evaluation was notable for a mildly elevated troponin and pro-BNP. CXR showed cardiomegaly and left sided basal opacification. He currently has no active complaints. He specifically denies angina, palpitations, LE edema.    Past Medical History  Diagnosis Date  . Hx of adenomatous colonic polyps   . Diabetes mellitus type II   . Hypertension   . Diverticulosis 2001  . Hyperlipidemia   . Persistent atrial fibrillation   . DJD (degenerative joint disease)   . Depression   . Obstructive sleep apnea     compliant with CPAP  . Chronic diastolic congestive heart failure   . Hypertensive cardiomyopathy   . Coronary-myocardial bridge   . 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 Coronary artery disease 03/07/2013  . History of cardioversion   . Shortness of breath     Hx: of with exertion  . Anxiety   . GERD (gastroesophageal reflux disease)   . H/O hiatal hernia   . Cancer     Basal cell carcinoma  . Adrenal adenoma   . 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 S/P Maze operation for atrial fibrillation 04/05/2013    Complete bilateral atrial lesion set using cryothermy and bipolar radiofrequency ablation with clipping of LA appendage    Past Surgical History  Procedure Laterality Date  . Polinydal cyst       Removed  . Great toe arthrodesis, interphalangeal joint      Right foot  . Retina repair-right    . Cataract extraction      bilateral  . Basal cell carcinoma excision      x3 on face  . Polypectomy    . Cardiac catheterization      myocardial bridge but no cad  . Coronary artery bypass graft N/A 04/05/2013    Procedure: CORONARY ARTERY BYPASS GRAFTING (CABG) TIMES TWO USING LEFT INTERNAL MAMMARY ARTERY AND RIGHT SAPHENOUS LEG VEIN HARVESTED ENDOSCOPICALLY;  Surgeon: Rexene Alberts, MD;  Location: [facility];  Service: Open Heart Surgery;  Laterality: N/A;  . Maze N/A 04/05/2013    Procedure: MAZE;  Surgeon: Rexene Alberts, MD;  Location: [facility];  Service: Open Heart Surgery;  Laterality: N/A;  . Intraoperative transesophageal echocardiogram N/A 04/05/2013    Procedure: INTRAOPERATIVE TRANSESOPHAGEAL ECHOCARDIOGRAM;  Surgeon: Rexene Alberts, MD;  Location: [facility];  Service: Open Heart Surgery;  Laterality: N/A;    Family History  Problem Relation Age of Onset  . Colon cancer Mother     Family History/Uncle   . Colon polyps Mother     Family History  . Atrial fibrillation Mother   . Hypertension Mother   . Colon polyps Sister     Family history  . Diabetes Maternal Uncle   . Stroke Paternal  Uncle   . Dementia Father     Social History:  reports that he quit smoking about 32 years ago. His smoking use included Cigarettes. He smoked 4.00 packs per day. He has never used smokeless tobacco. He reports that he drinks alcohol. He reports that he does not use illicit drugs.  Allergies:  Allergies  Allergen Reactions  . Horse-Derived Products Other (See Comments)    unknown  . Other Other (See Comments)    Tetanus Shot   (unknown reaction)  . Sunflower Seed [Sunflower Oil] Swelling    Current Facility-Administered Medications  Medication Dose Route Frequency Provider Last Rate Last Dose  . insulin aspart (novoLOG) injection 0-15 Units  0-15 Units Subcutaneous TID WC Theressa Stamps, MD       . insulin aspart (novoLOG) injection 0-5 Units  0-5 Units Subcutaneous QHS Theressa Stamps, MD       Current Outpatient Prescriptions  Medication Sig Dispense Refill  . acetaminophen (TYLENOL) 500 MG tablet Take 1,000 mg by mouth every 6 (six) hours as needed for mild pain.       [name] ALPRAZolam (XANAX) 0.25 MG tablet Take 0.25-1 mg by mouth at bedtime as needed for sleep.       [name] amiodarone (PACERONE) 400 MG tablet Take 1 tablet (400 mg total) by mouth 2 (two) times daily. For 7 days, then take 400 mg dailt  70 tablet  1  . aspirin EC 81 MG EC tablet Take 1 tablet (81 mg total) by mouth daily.      [name] atorvastatin (LIPITOR) 20 MG tablet Take 20 mg by mouth 3 (three) times a week. Monday Wednesday and Friday      . Cholecalciferol (VITAMIN D3) 5000 UNITS CAPS Take 5,000 Units by mouth daily.      [name] CINNAMON PO Take 2,000 mg by mouth daily.      [name] CLONIDINE HCL PO Take 0.2 mg by mouth 3 (three) times daily.       . diphenhydrAMINE (BENADRYL) 25 MG tablet Take 25 mg by mouth every 6 (six) hours as needed for allergies.       [name] glimepiride (AMARYL) 2 MG tablet Take 2 mg by mouth daily before breakfast.        . HYDROcodone-acetaminophen (VICODIN) 5-500 MG per tablet Take 1 tablet by mouth every 6 (six) hours as needed for pain.      . metFORMIN (GLUCOPHAGE-XR) 500 MG 24 hr tablet Take 1,000 mg by mouth 2 (two) times daily.       . metoprolol tartrate (LOPRESSOR) 25 MG tablet Take 12.5 mg by mouth 2 (two) times daily.      . minoxidil (LONITEN) 10 MG tablet Take 20 mg by mouth at bedtime.      . Multiple Vitamins-Minerals (PRESERVISION AREDS PO) Take 1 capsule by mouth 2 (two) times daily.      . ranitidine (ZANTAC) 150 MG tablet Take 150 mg by mouth daily as needed for heartburn.       . sertraline (ZOLOFT) 100 MG tablet Take 100 mg by mouth daily.      [name] triamterene-hydrochlorothiazide (MAXZIDE-25) 37.5-25 MG per tablet Take 1 tablet by mouth daily.  30 tablet  12  . valsartan (DIOVAN) 320 MG  tablet Take 320 mg by mouth daily.      [name] warfarin (COUMADIN) 5 MG tablet Take 1 tablet (5 mg total) by mouth daily at 6 PM.  100 tablet  1  . BESIVANCE 0.6 % SUSP Place  1 drop into the left eye 4 (four) times daily as needed (after injection). Use in left eye 4 times a day after each injection        ROS: A full review of systems is obtained and is negative except as noted in the HPI.  Physical Exam: Blood pressure 113/60, pulse 65, temperature 98.7 F (37.1 C), temperature source Oral, resp. rate 19, SpO2 95.00%.  GENERAL: no acute distress.  EYES: Extra ocular movements are intact. There is no lid lag. Sclera is anicteric.  ENT: Oropharynx is clear. Dentition is within normal limits.  NECK: Supple. The thyroid is not enlarged.  LYMPH: There are no masses or lymphadenopathy present.  HEART: Regular rate and rhythm. + Pericardial rub. Somewhat diminished S1/S2. JVP elevated at 10 cm H20.  CHEST/LUNGS: Midline incision healing nicely. Clean dry. Lungs with crackles at bases that improve with cough. ABDOMEN: Soft, non-tender, and non-distended with normoactive bowel sounds. There is no hepatosplenomegaly.  EXTREMITIES: Trace edema bilaterally, R > L. No clubbing, cyanosis. PULSES: Carotids were +2 and equal bilaterally with no bruits.  SKIN: Warm, dry, and intact.  NEUROLOGIC: The patient was oriented to person, place, and time. No overt neurologic deficits were detected.  PSYCH: Normal judgment and insight, mood is appropriate.   Results: Results for orders placed during the hospital encounter of 04/13/13 (from the past 24 hour(s))  CBC WITH DIFFERENTIAL     Status: Abnormal   Collection Time    04/13/13  1:40 AM      Result Value Ref Range   WBC 12.4 (*) 4.0 - 10.5 K/uL   RBC 3.32 (*) 4.22 - 5.81 MIL/uL   Hemoglobin 10.4 (*) 13.0 - 17.0 g/dL   HCT 30.5 (*) 39.0 - 52.0 %   MCV 91.9  78.0 - 100.0 fL   MCH 31.3  26.0 - 34.0 pg   MCHC 34.1  30.0 - 36.0 g/dL   RDW 13.9  11.5 - 15.5 %    Platelets 243  150 - 400 K/uL   Neutrophils Relative % 79 (*) 43 - 77 %   Neutro Abs 10.0 (*) 1.7 - 7.7 K/uL   Lymphocytes Relative 11 (*) 12 - 46 %   Lymphs Abs 1.3  0.7 - 4.0 K/uL   Monocytes Relative 9  3 - 12 %   Monocytes Absolute 1.1 (*) 0.1 - 1.0 K/uL   Eosinophils Relative 1  0 - 5 %   Eosinophils Absolute 0.1  0.0 - 0.7 K/uL   Basophils Relative 0  0 - 1 %   Basophils Absolute 0.0  0.0 - 0.1 K/uL  BASIC METABOLIC PANEL     Status: Abnormal   Collection Time    04/13/13  1:40 AM      Result Value Ref Range   Sodium 139  137 - 147 mEq/L   Potassium 4.1  3.7 - 5.3 mEq/L   Chloride 99  96 - 112 mEq/L   CO2 25  19 - 32 mEq/L   Glucose, Bld 168 (*) 70 - 99 mg/dL   BUN 22  6 - 23 mg/dL   Creatinine, Ser 1.09  0.50 - 1.35 mg/dL   Calcium 8.5  8.4 - 10.5 mg/dL   GFR calc non Af Amer 65 (*) >90 mL/min   GFR calc Af Amer 75 (*) >90 mL/min  PRO B NATRIURETIC PEPTIDE     Status: Abnormal   Collection Time    04/13/13  1:40 AM  Result Value Ref Range   Pro B Natriuretic peptide (BNP) 2226.0 (*) 0 - 125 pg/mL  PROTIME-INR     Status: Abnormal   Collection Time    04/13/13  1:40 AM      Result Value Ref Range   Prothrombin Time 20.2 (*) 11.6 - 15.2 seconds   INR 1.78 (*) 0.00 - 1.49  TROPONIN I     Status: Abnormal   Collection Time    04/13/13  1:40 AM      Result Value Ref Range   Troponin I 0.45 (*) <0.30 ng/mL  I-STAT TROPOININ, ED     Status: Abnormal   Collection Time    04/13/13  1:43 AM      Result Value Ref Range   Troponin i, poc 0.47 (*) 0.00 - 0.08 ng/mL   Comment NOTIFIED PHYSICIAN     Comment 3             EKG: reviewed CXR: reviewed  Assessment: 1. SOB 2. Recent CABG/MAZE 3. Hx of atrial fibrillation on coumadin 4. Chronic ischemic heart disease 5. DM   Plan: - Will place in obs.  - Trend troponins. - Check echo in AM.  - Provide one time dose of IV Lasix.  - Reassess post eval and mgmt above.   Theressa Stamps 04/13/2013, 4:53 AM

## 2013-04-13 NOTE — Progress Notes (Signed)
Please see Dr Roxanne Gates admission note from this am. The patient denies chest pain or shortness of breath. He did have acute shortness of breath last night prompting his emergency room evaluation. He denies chest pain. His exam does reveal a pericardial friction rub. His right leg shows ecchymoses and 2+ edema. The remainder of his exam is unremarkable. Reviewed his serial EKGs and areas mild diffuse ST elevation which could be suggestive of post-pericardiotomy pericarditis. As he is on warfarin, will check an echocardiogram to evaluate for the possibility of pericardial effusion. His troponin is minimally elevated. Will cycle another set of cardiac enzymes but I suspect this is trending down from surgery.  Sherren Mocha 04/13/2013 8:41 AM

## 2013-04-13 NOTE — Progress Notes (Addendum)
[name]       [name] [address]             [phone]           Subjective: Still intermittently SOB  Objective  Telemetry afib with CVR  Temp:  [97.9 F (36.6 C)-98.7 F (37.1 C)] 97.9 F (36.6 C) (04/09 0609) Pulse Rate:  [63-68] 67 (04/09 0609) Resp:  [14-25] 20 (04/09 0609) BP: (102-125)/(56-78) 125/76 mmHg (04/09 0609) SpO2:  [94 %-98 %] 98 % (04/09 0609) Weight:  [203 lb 0.7 oz (92.1 kg)] 203 lb 0.7 oz (92.1 kg) (04/09 0609)  No intake or output data in the 24 hours ending 04/13/13 0958     General appearance: alert, cooperative and no distress Heart: irregularly irregular rhythm Lungs: dim in left base Abdomen: benign Extremities: minor edema Wound: incis healing well  Lab Results:  Recent Labs  04/12/13 0313 04/13/13 0140  NA 138 139  K 4.7 4.1  CL 99 99  CO2 27 25  GLUCOSE 170* 168*  BUN 21 22  CREATININE 1.10 1.09  CALCIUM 8.5 8.5   No results found for this basename: AST, ALT, ALKPHOS, BILITOT, PROT, ALBUMIN,  in the last 72 hours No results found for this basename: LIPASE, AMYLASE,  in the last 72 hours  Recent Labs  04/12/13 0313 04/13/13 0140  WBC 12.5* 12.4*  NEUTROABS  --  10.0*  HGB 11.0* 10.4*  HCT 33.0* 30.5*  MCV 93.0 91.9  PLT 236 243    Recent Labs  04/13/13 0140  TROPONINI 0.45*   No components found with this basename: POCBNP,  No results found for this basename: DDIMER,  in the last 72 hours No results found for this basename: HGBA1C,  in the last 72 hours No results found for this basename: CHOL, HDL, LDLCALC, TRIG, CHOLHDL,  in the last 72 hours No results found for this basename: TSH, T4TOTAL, FREET3, T3FREE, THYROIDAB,  in the last 72 hours No results found for this basename: VITAMINB12, FOLATE, FERRITIN, TIBC, IRON, RETICCTPCT,  in the last 72 hours  Medications: Scheduled . amiodarone  400 mg Oral BID   And  . amiodarone  400 mg Oral Daily  . [START ON 04/14/2013] aspirin  EC  81 mg Oral Daily  . [START ON 04/14/2013] atorvastatin  20 mg Oral Once per day on Mon Wed Fri  . cloNIDine  0.2 mg Oral 3 times per day  . famotidine  20 mg Oral Daily  . furosemide  40 mg Intravenous Once  . glimepiride  2 mg Oral QAC breakfast  . insulin aspart  0-15 Units Subcutaneous TID WC  . insulin aspart  0-5 Units Subcutaneous QHS  . irbesartan  75 mg Oral Daily  . metFORMIN  1,000 mg Oral BID WC  . metoprolol tartrate  12.5 mg Oral BID  . sertraline  100 mg Oral Daily  . triamterene-hydrochlorothiazide  1 tablet Oral Daily  . warfarin  5 mg Oral q1800  . Warfarin - Physician Dosing Inpatient   Does not apply q1800     Radiology/Studies:  Dg Chest Portable 1 View  04/13/2013   CLINICAL DATA:  Shortness of breath.  EXAM: PORTABLE CHEST - 1 VIEW  COMPARISON:  04/10/2013  FINDINGS: Lungs are adequately inflated and demonstrate mild prominence of the perihilar markings suggesting mild vascular congestion. There is continued opacification over the left base/ retrocardiac region which is unchanged to slightly worse. This may  represent a combination of left effusion with atelectasis although cannot exclude infection. There stable cardiomegaly. Remainder the exam is unchanged.  IMPRESSION: Cardiomegaly with mild vascular congestion. Persistent opacification over the left base which is unchanged to slightly worse and may be due to effusion with atelectasis, although cannot exclude infection.   Electronically Signed   By: Marin Olp M.D.   On: 04/13/2013 02:39    INR: Will add last result for INR, ABG once components are confirmed Will add last 4 CBG results once components are confirmed  Assessment/Plan:  1 appears more stable, received some lasix.  2 cardiology to check ECHO today   LOS: 0 days    [name] 4/9/20159:58 AM  Patient seen and examied Readmitted with shortness of breath CXR shows some opacity in left base- looks more like atelectasis/ infiltrate than a  significant effusion Suspect it is mostly CHF as BNP was 2200 Agree with plan for an echo

## 2013-04-13 NOTE — ED Provider Notes (Signed)
CSN: [id number]     Arrival date & time 04/13/13  0122 History   First MD Initiated Contact with Patient 04/13/13 0151     Chief Complaint  Patient presents with  . Shortness of Breath     (Consider location/radiation/quality/duration/timing/severity/associated sxs/prior Treatment) HPI HX per wife and PT bedside.  H/o afib, recent admit to the hospital for ablation, had cardiac cath and found to have sig disease, underwent CABG and Maze procedure. Doing well post op and was discharged home yesterday.  Tonight going to bed developed SOB and presents here. No CP. No new leg pain or swelling. Has some sternal CP in the area of CABG scar unchanged - no new pain. No F/C, no cough.    Past Medical History  Diagnosis Date  . Hx of adenomatous colonic polyps   . Diabetes mellitus type II   . Hypertension   . Diverticulosis 2001  . Hyperlipidemia   . Persistent atrial fibrillation   . DJD (degenerative joint disease)   . Depression   . Obstructive sleep apnea     compliant with CPAP  . Chronic diastolic congestive heart failure   . Hypertensive cardiomyopathy   . Coronary-myocardial bridge   . 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 Coronary artery disease 03/07/2013  . History of cardioversion   . Shortness of breath     Hx: of with exertion  . Anxiety   . GERD (gastroesophageal reflux disease)   . H/O hiatal hernia   . Cancer     Basal cell carcinoma  . Adrenal adenoma   . 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 S/P Maze operation for atrial fibrillation 04/05/2013    Complete bilateral atrial lesion set using cryothermy and bipolar radiofrequency ablation with clipping of LA appendage   Past Surgical History  Procedure Laterality Date  . Polinydal cyst      Removed  . Great toe arthrodesis, interphalangeal joint      Right foot  . Retina repair-right    . Cataract extraction      bilateral  . Basal cell  carcinoma excision      x3 on face  . Polypectomy    . Cardiac catheterization      myocardial bridge but no cad  . Coronary artery bypass graft N/A 04/05/2013    Procedure: CORONARY ARTERY BYPASS GRAFTING (CABG) TIMES TWO USING LEFT INTERNAL MAMMARY ARTERY AND RIGHT SAPHENOUS LEG VEIN HARVESTED ENDOSCOPICALLY;  Surgeon: Rexene Alberts, MD;  Location: [facility];  Service: Open Heart Surgery;  Laterality: N/A;  . Maze N/A 04/05/2013    Procedure: MAZE;  Surgeon: Rexene Alberts, MD;  Location: [facility];  Service: Open Heart Surgery;  Laterality: N/A;  . Intraoperative transesophageal echocardiogram N/A 04/05/2013    Procedure: INTRAOPERATIVE TRANSESOPHAGEAL ECHOCARDIOGRAM;  Surgeon: Rexene Alberts, MD;  Location: [facility];  Service: Open Heart Surgery;  Laterality: N/A;   Family History  Problem Relation Age of Onset  . Colon cancer Mother     Family History/Uncle   . Colon polyps Mother     Family History  . Atrial fibrillation Mother   . Hypertension Mother   . Colon polyps Sister     Family history  . Diabetes Maternal Uncle   . Stroke Paternal Uncle   . Dementia Father    History  Substance Use Topics  . Smoking status: Former  Smoker -- 4.00 packs/day    Types: Cigarettes    Quit date: 01/05/1981  . Smokeless tobacco: Never Used     Comment: Stopped 1983  . Alcohol Use: Yes     Comment: 1-5 drinks per week    Review of Systems  Constitutional: Negative for fever and chills.  Respiratory: Positive for shortness of breath.   Cardiovascular: Positive for chest pain.  Gastrointestinal: Negative for abdominal pain.  Genitourinary: Negative for flank pain.  Musculoskeletal: Negative for back pain, neck pain and neck stiffness.  Skin: Negative for rash.  Neurological: Negative for headaches.  All other systems reviewed and are negative.     Allergies  Horse-derived products; Other; and Sunflower seed  Home Medications   Current Outpatient Rx  Name  Route  Sig  Dispense  Refill   . acetaminophen (TYLENOL) 500 MG tablet   Oral   Take 1,000 mg by mouth every 6 (six) hours as needed for mild pain.          [name] ALPRAZolam (XANAX) 0.25 MG tablet   Oral   Take 0.25-1 mg by mouth at bedtime as needed for sleep.          [name] amiodarone (PACERONE) 400 MG tablet   Oral   Take 1 tablet (400 mg total) by mouth 2 (two) times daily. For 7 days, then take 400 mg dailt   70 tablet   1   . aspirin EC 81 MG EC tablet   Oral   Take 1 tablet (81 mg total) by mouth daily.         [name] atorvastatin (LIPITOR) 20 MG tablet   Oral   Take 20 mg by mouth 3 (three) times a week. Monday Wednesday and Friday         . BESIVANCE 0.6 % SUSP   Left Eye   Place 1 drop into the left eye 4 (four) times daily as needed (after injection). Use in left eye 4 times a day after each injection         . Cholecalciferol (VITAMIN D3) 5000 UNITS CAPS   Oral   Take 5,000 Units by mouth daily.         [name] CINNAMON PO   Oral   Take 2,000 mg by mouth daily.         [name] CLONIDINE HCL PO   Oral   Take 0.2 mg by mouth 3 (three) times daily.          . diphenhydrAMINE (BENADRYL) 25 MG tablet   Oral   Take 25 mg by mouth every 6 (six) hours as needed for allergies.          [name] glimepiride (AMARYL) 2 MG tablet   Oral   Take 2 mg by mouth daily before breakfast.           . metFORMIN (GLUCOPHAGE-XR) 500 MG 24 hr tablet   Oral   Take 1,000 mg by mouth 2 (two) times daily.          . metoprolol tartrate (LOPRESSOR) 12.5 mg TABS tablet   Oral   Take 0.5 tablets (12.5 mg total) by mouth 2 (two) times daily.   60 each   1   . Multiple Vitamins-Minerals (PRESERVISION AREDS PO)   Oral   Take 1 capsule by mouth 2 (two) times daily.         [name] oxyCODONE (OXY IR/ROXICODONE) 5 MG immediate release tablet   Oral   Take 1-2  tablets (5-10 mg total) by mouth every 4 (four) hours as needed for moderate pain.   50 tablet   0   . ranitidine (ZANTAC) 150 MG tablet   Oral   Take 150 mg  by mouth daily as needed for heartburn.          . sertraline (ZOLOFT) 100 MG tablet   Oral   Take 100 mg by mouth daily.         [name] triamterene-hydrochlorothiazide (MAXZIDE-25) 37.5-25 MG per tablet   Oral   Take 1 tablet by mouth daily.   30 tablet   12   . valsartan (DIOVAN) 320 MG tablet   Oral   Take 320 mg by mouth daily.         [name] warfarin (COUMADIN) 5 MG tablet   Oral   Take 1 tablet (5 mg total) by mouth daily at 6 PM.   100 tablet   1    BP 116/69  Temp(Src) 98.7 F (37.1 C) (Oral)  Resp 25  SpO2 94% Physical Exam  Constitutional: He is oriented to person, place, and time. He appears well-developed and well-nourished.  HENT:  Head: Normocephalic and atraumatic.  Eyes: EOM are normal. Pupils are equal, round, and reactive to light.  Neck: Neck supple.  Cardiovascular: Regular rhythm and intact distal pulses.   Pulmonary/Chest: Effort normal and breath sounds normal. No respiratory distress.  Tenderness over area of CABG scar, no wound drainage/ erythema.  Abdominal: Soft. He exhibits no distension. There is no tenderness.  Musculoskeletal: Normal range of motion.  2 plus LE edema  Neurological: He is alert and oriented to person, place, and time.  Skin: Skin is warm and dry.    ED Course  Procedures (including critical care time) Labs Review Labs Reviewed  CBC WITH DIFFERENTIAL - Abnormal; Notable for the following:    WBC 12.4 (*)    RBC 3.32 (*)    Hemoglobin 10.4 (*)    HCT 30.5 (*)    Neutrophils Relative % 79 (*)    Neutro Abs 10.0 (*)    Lymphocytes Relative 11 (*)    Monocytes Absolute 1.1 (*)    All other components within normal limits  BASIC METABOLIC PANEL - Abnormal; Notable for the following:    Glucose, Bld 168 (*)    GFR calc non Af Amer 65 (*)    GFR calc Af Amer 75 (*)    All other components within normal limits  PRO B NATRIURETIC PEPTIDE - Abnormal; Notable for the following:    Pro B Natriuretic peptide (BNP) 2226.0 (*)     All other components within normal limits  PROTIME-INR - Abnormal; Notable for the following:    Prothrombin Time 20.2 (*)    INR 1.78 (*)    All other components within normal limits  TROPONIN I - Abnormal; Notable for the following:    Troponin I 0.45 (*)    All other components within normal limits  I-STAT TROPOININ, ED - Abnormal; Notable for the following:    Troponin i, poc 0.47 (*)    All other components within normal limits   Imaging Review Dg Chest Portable 1 View  04/13/2013   CLINICAL DATA:  Shortness of breath.  EXAM: PORTABLE CHEST - 1 VIEW  COMPARISON:  04/10/2013  FINDINGS: Lungs are adequately inflated and demonstrate mild prominence of the perihilar markings suggesting mild vascular congestion. There is continued opacification over the left base/ retrocardiac region which is unchanged to  slightly worse. This may represent a combination of left effusion with atelectasis although cannot exclude infection. There stable cardiomegaly. Remainder the exam is unchanged.  IMPRESSION: Cardiomegaly with mild vascular congestion. Persistent opacification over the left base which is unchanged to slightly worse and may be due to effusion with atelectasis, although cannot exclude infection.   Electronically Signed   By: Marin Olp M.D.   On: 04/13/2013 02:39     Date: 04/13/2013  Rate: 67  Rhythm: atrial fibrillation  QRS Axis: normal  Intervals: normal  ST/T Wave abnormalities: ST elevations laterally  Conduction Disutrbances:none  Narrative Interpretation:   Old EKG Reviewed: previous ECG 04/08/13 shows similar STE lateral leads  ECG reviewed, PT evaluated, STEMI MD on call consulted. 2:13 AM d/w Dr Martin[place], he reviewed ECG and agrees no code STEMI, plan CAR admit. [place] fellow consulted, will admit  Aspirin provided. Lasix provided. MDM   Diagnosis: Dyspnea, elevated troponin  EKG, labs and imaging reviewed as above.  EMR records reviewed from recent hospitalization. CABG  and Maze procedure 04/05/2013  Medications provided  cardiology consult/ admission  Teressa Lower, MD 04/13/13 [phone]

## 2013-04-13 NOTE — Progress Notes (Signed)
Pt tolerating oob in chair and to the bathroom on today, continues on O2 at 2 liter sats 98 %. States he has been trying to sleep today, complaint of sob just as he drop off to sleep, no acute distress Alfonzo Feller

## 2013-04-13 NOTE — ED Notes (Signed)
MD Marnette Burgess made aware of critical troponin of .45.

## 2013-04-13 NOTE — Progress Notes (Signed)
  Echocardiogram 2D Echocardiogram has been performed.  [name] 04/13/2013, 11:43 AM

## 2013-04-13 NOTE — ED Notes (Signed)
Per EMS, Pt was d/c this morning after being here for one week post CABG. Pt has been ambulatory at home today. When pt went to sleep tonight he woke up with SOB. Pt denies CP at this time other than incision pain.

## 2013-04-14 ENCOUNTER — Encounter (HOSPITAL_COMMUNITY): Payer: Self-pay | Admitting: Nurse Practitioner

## 2013-04-14 DIAGNOSIS — I5033 Acute on chronic diastolic (congestive) heart failure: Secondary | ICD-10-CM

## 2013-04-14 LAB — PROTIME-INR
INR: 1.93 — AB (ref 0.00–1.49)
Prothrombin Time: 21.5 seconds — ABNORMAL HIGH (ref 11.6–15.2)

## 2013-04-14 LAB — GLUCOSE, CAPILLARY
GLUCOSE-CAPILLARY: 130 mg/dL — AB (ref 70–99)
GLUCOSE-CAPILLARY: 141 mg/dL — AB (ref 70–99)

## 2013-04-14 MED ORDER — FUROSEMIDE 40 MG PO TABS: 40.0000 mg | ORAL_TABLET | Freq: Every day | ORAL | Status: DC

## 2013-04-14 MED ORDER — FUROSEMIDE 40 MG PO TABS
40.0000 mg | ORAL_TABLET | Freq: Every day | ORAL | Status: DC
  Filled 2013-04-14: qty 1

## 2013-04-14 MED ORDER — NITROGLYCERIN 0.4 MG SL SUBL: 0.4000 mg | SUBLINGUAL_TABLET | SUBLINGUAL | Status: DC | PRN

## 2013-04-14 NOTE — Progress Notes (Signed)
[address]       ,[place] [address]             [phone]           Subjective: Feels better, not SOB echo results noted, normal EFx  Objective  Telemetry afib with CVR   Temp:  [98 F (36.7 C)-98.6 F (37 C)] 98.5 F (36.9 C) (04/10 0421) Pulse Rate:  [64-71] 64 (04/10 0421) Resp:  [18] 18 (04/10 0421) BP: (115-140)/(72-76) 115/76 mmHg (04/10 0421) SpO2:  [93 %-98 %] 93 % (04/10 0421) Weight:  [200 lb 9.6 oz (90.992 kg)] 200 lb 9.6 oz (90.992 kg) (04/10 0537)   Intake/Output Summary (Last 24 hours) at 04/14/13 0759 Last data filed at 04/14/13 0302  Gross per 24 hour  Intake    480 ml  Output   1800 ml  Net  -1320 ml       General appearance: alert, cooperative and no distress Heart: irregularly irregular rhythm Lungs: clear to auscultation bilaterally Extremities: + Minor LE edema Wound: incis healing well, some drainage from [facility] site-serosang  Lab Results:  Recent Labs  04/12/13 0313 04/13/13 0140  NA 138 139  K 4.7 4.1  CL 99 99  CO2 27 25  GLUCOSE 170* 168*  BUN 21 22  CREATININE 1.10 1.09  CALCIUM 8.5 8.5   No results found for this basename: AST, ALT, ALKPHOS, BILITOT, PROT, ALBUMIN,  in the last 72 hours No results found for this basename: LIPASE, AMYLASE,  in the last 72 hours  Recent Labs  04/13/13 0140 04/13/13 0830  WBC 12.4* 11.9*  NEUTROABS 10.0*  --   HGB 10.4* 10.5*  HCT 30.5* 31.3*  MCV 91.9 93.2  PLT 243 241    Recent Labs  04/13/13 0140 04/13/13 0830 04/13/13 1235 04/13/13 1955  TROPONINI 0.45* 0.41* 0.40* 0.38*   No components found with this basename: POCBNP,  No results found for this basename: DDIMER,  in the last 72 hours No results found for this basename: HGBA1C,  in the last 72 hours No results found for this basename: CHOL, HDL, LDLCALC, TRIG, CHOLHDL,  in the last 72 hours No results found for this basename: TSH, T4TOTAL, FREET3, T3FREE, THYROIDAB,  in the last 72 hours No  results found for this basename: VITAMINB12, FOLATE, FERRITIN, TIBC, IRON, RETICCTPCT,  in the last 72 hours  Medications: Scheduled . [START ON 04/19/2013] amiodarone  400 mg Oral Daily   And  . amiodarone  400 mg Oral BID  . aspirin EC  81 mg Oral Daily  . atorvastatin  20 mg Oral Once per day on Mon Wed Fri  . cloNIDine  0.2 mg Oral 3 times per day  . famotidine  20 mg Oral Daily  . furosemide  40 mg Intravenous Once  . glimepiride  2 mg Oral QAC breakfast  . insulin aspart  0-15 Units Subcutaneous TID WC  . insulin aspart  0-5 Units Subcutaneous QHS  . irbesartan  75 mg Oral Daily  . metFORMIN  1,000 mg Oral BID WC  . metoprolol tartrate  12.5 mg Oral BID  . sertraline  100 mg Oral Daily  . sodium chloride  3 mL Intravenous Q12H  . triamterene-hydrochlorothiazide  1 tablet Oral Daily  . warfarin  5 mg Oral q1800  . Warfarin - Physician Dosing Inpatient   Does not apply q1800     Radiology/Studies:  Dg Chest Portable 1 View  04/13/2013  CLINICAL DATA:  Shortness of breath.  EXAM: PORTABLE CHEST - 1 VIEW  COMPARISON:  04/10/2013  FINDINGS: Lungs are adequately inflated and demonstrate mild prominence of the perihilar markings suggesting mild vascular congestion. There is continued opacification over the left base/ retrocardiac region which is unchanged to slightly worse. This may represent a combination of left effusion with atelectasis although cannot exclude infection. There stable cardiomegaly. Remainder the exam is unchanged.  IMPRESSION: Cardiomegaly with mild vascular congestion. Persistent opacification over the left base which is unchanged to slightly worse and may be due to effusion with atelectasis, although cannot exclude infection.   Electronically Signed   By: Marin Olp M.D.   On: 04/13/2013 02:39    INR: Will add last result for INR, ABG once components are confirmed Will add last 4 CBG results once components are confirmed  Assessment/Plan: 1 good clinical  improvement. He hopes to go home today    LOS: 1 day    John Giovanni 4/10/20157:59 AM

## 2013-04-14 NOTE — Progress Notes (Signed)
CARDIAC REHAB PHASE I   PRE:  Rate/Rhythm: 74  BP:  Supine:   Sitting: 120/80  Standing:    SaO2: 95 RA  MODE:  Ambulation: 890 ft   POST:  Rate/Rhythm: 80  BP:  Supine:   Sitting: 146/80  Standing:    SaO2: 96 RA 1055-1125 Pt tolerated ambulation well without c/o using walker. VS stable pt able to walk 890 feet. Pt back to recliner after walk. RA sats 95-96%.  Rodney Langton RN 04/14/2013 11:26 AM

## 2013-04-14 NOTE — Progress Notes (Signed)
    Subjective:  Feels better. No chest pain or dyspnea. Slept well last PM.  Objective:  Vital Signs in the last 24 hours: Temp:  [98 F (36.7 C)-98.6 F (37 C)] 98.5 F (36.9 C) (04/10 0421) Pulse Rate:  [64-71] 64 (04/10 0421) Resp:  [18] 18 (04/10 0421) BP: (115-140)/(72-76) 115/76 mmHg (04/10 0421) SpO2:  [93 %-98 %] 93 % (04/10 0421) Weight:  [200 lb 9.6 oz (90.992 kg)] 200 lb 9.6 oz (90.992 kg) (04/10 0537)  Intake/Output from previous day: 04/09 0701 - 04/10 0700 In: 480 [P.O.:480] Out: 1800 [Urine:1800]  Physical Exam: Pt is alert and oriented, NAD HEENT: normal Neck: JVP - normal, carotids 2+= without bruits Lungs: CTA bilaterally CV: irregularly irregular without murmur or gallop Abd: soft, NT, Positive BS, no hepatomegaly Ext: trace pretibial edema bilaterally, distal pulses intact and equal Skin: warm/dry no rash   Lab Results:  Recent Labs  04/13/13 0140 04/13/13 0830  WBC 12.4* 11.9*  HGB 10.4* 10.5*  PLT 243 241    Recent Labs  04/12/13 0313 04/13/13 0140  NA 138 139  K 4.7 4.1  CL 99 99  CO2 27 25  GLUCOSE 170* 168*  BUN 21 22  CREATININE 1.10 1.09    Recent Labs  04/13/13 1235 04/13/13 1955  TROPONINI 0.40* 0.38*    Cardiac Studies: 2D Echo: Study Conclusions  - Left ventricle: The cavity size was normal. Wall thickness was increased in a pattern of severe LVH. Systolic function was normal. The estimated ejection fraction was in the range of 55% to 60%. Wall motion was normal; there were no regional wall motion abnormalities. - Right atrium: The atrium was mildly dilated. - Atrial septum: No subcostal images done - Pericardium, extracardiac: NO pericardial effusion seen on apical or parasternal views No subcostal views done    Tele: Atrial fibrillation, rate-controlled.  Assessment/Plan:  1. Shortness of breath, likely combination of acute diastolic CHF/anxiety, improved with single dose of IV lasix 2. Post-op  CABG/Maze 3. Diabetes, Type 2 4. Atrial fibrillation, persistent in early post-op period after Maze  Echo/CXR reviewed. Clinically improved. Stable for discharge today. Follow-up scheduled with Richardson Dopp 4/22 and Dr Acie Fredrickson in May. Medications reviewed: recommend STOP Maxzide and START furosemide 40 mg daily. Check BMET at f/u 4/22. Otherwise continue home med Rx.   Sherren Mocha, M.D. 04/14/2013, 9:16 AM

## 2013-04-14 NOTE — Discharge Summary (Addendum)
Discharge Summary   Patient ID: [name],  MRN: [id number], DOB/AGE: Nov 18, 1940 73 y.o.  Admit date: 04/13/2013 Discharge date: 04/14/2013  Primary Care Provider: MCKEOWN,WILLIAM DAVID Primary Cardiologist: Joaquim Nam, MD  Thoracic Surgeon: C. Roxy Manns, MD  Discharge Diagnoses Principal Problem:   Acute on chronic diastolic heart failure  **Net negative diuresis 1.3 L this admission.  **Discharge weight 200 lbs (down from 203 lbs).  Active Problems:   CAD (coronary artery disease)   S/P CABG x 2 and maze procedure 04/05/2013   DIABETES MELLITUS, TYPE II   HYPERTENSION   HYPERLIPIDEMIA   Atrial fibrillation  **On amio/coumadin.  **S/P Maze.  Allergies Allergies  Allergen Reactions  . Horse-Derived Products Other (See Comments)    unknown  . Other Other (See Comments)    Tetanus Shot   (unknown reaction)  . Sunflower Seed [Sunflower Oil] Swelling    Procedures  2D Echocardiogram 4.9.2015  Study Conclusions  - Left ventricle: The cavity size was normal. Wall thickness   was increased in a pattern of severe LVH. Systolic   function was normal. The estimated ejection fraction was   in the range of 55% to 60%. Wall motion was normal; there   were no regional wall motion abnormalities. - Right atrium: The atrium was mildly dilated. - Atrial septum: No subcostal images done - Pericardium, extracardiac: NO pericardial effusion seen on   apical or parasternal views No subcostal views done _____________   History of Present Illness  73 year old male with prior history of coronary artery disease status post recent coronary artery bypass grafting x2 on 04/05/2013 with placement of a LIMA to the LAD and a vein graft to the ramus intermedius. Patient also underwent MAZE procedure at that time. His post operative course was relatively uneventful and he was discharged home on April 7. Unfortunately, on the evening of April 8 he developed orthopnea and dyspnea at rest. He presented  to the [facility] where his proBNP was mildly elevated at 2226 with a point-of-care troponin of 0.47. His chest x-ray showed cardiomegaly and left-sided basal opacification. He was felt to have volume overload on exam and was treated with one dose of IV Lasix in the emergency department with good response and symptomatic improvement. He was observed for further evaluation.  Hospital Course  Subsequent troponins trended in a downward fashion and it was felt that elevation was more likely secondary to recent surgery than an acute event. His chest x-ray was reviewed it was felt that the left basal opacity is more likely secondary to atelectasis than a significant effusion. A 2-D echocardiogram was carried out and showed normal LV function with an EF of 55-60% with severe LVH. Ultimately it was felt that his presentation was most consistent with acute on chronic diastolic congestive heart failure. He did not require any further diuresis since leaving the emergency department. He has been ambulating without recurrent symptoms or limitations and will be discharged home today in good condition. We have discontinued his previous home dose of Maxzide and instead have placed him on Lasix 40 mg daily. We will followup a basic metabolic profile when he is seen back in clinic on the 22nd.  Discharge Vitals Blood pressure 115/76, pulse 64, temperature 98.5 F (36.9 C), temperature source Oral, resp. rate 18, weight 200 lb 9.6 oz (90.992 kg), SpO2 93.00%.  Filed Weights   04/13/13 0609 04/14/13 0537  Weight: 203 lb 0.7 oz (92.1 kg) 200 lb 9.6 oz (90.992 kg)  Labs  CBC  Recent Labs  04/13/13 0140 04/13/13 0830  WBC 12.4* 11.9*  NEUTROABS 10.0*  --   HGB 10.4* 10.5*  HCT 30.5* 31.3*  MCV 91.9 93.2  PLT 243 [id number]   Basic Metabolic Panel  Recent Labs  04/12/13 0313 04/13/13 0140  NA 138 139  K 4.7 4.1  CL 99 99  CO2 27 25  GLUCOSE 170* 168*  BUN 21 22  CREATININE 1.10 1.09  CALCIUM 8.5 8.5    Cardiac Enzymes  Recent Labs  04/13/13 0830 04/13/13 1235 04/13/13 1955  TROPONINI 0.41* 0.40* 0.38*   Disposition  Pt is being discharged home today in good condition.  Follow-up Plans & Appointments  Follow-up Information   Follow up with Richardson Dopp, PA-C On 04/26/2013. (11:10 AM)    Specialty:  Physician Assistant   Contact information:   [address] [place] [address] [phone]       Follow up with Rexene Alberts, MD On 05/01/2013. (4:30 PM)    Specialty:  Cardiothoracic Surgery   Contact information:   [address] [place] [address] [phone]       Follow up with Darden Amber., MD On 05/26/2013. (3:30 PM)    Specialty:  Cardiology   Contact information:   [address] [place] [address] [phone]       Follow up with [place] On 04/18/2013. (3:15 PM)    Specialty:  Cardiology   Contact information:   [address] [place] [address] [place] [address] [phone]      Discharge Medications    Medication List    STOP taking these medications       triamterene-hydrochlorothiazide 37.5-25 MG per tablet  Commonly known as:  MAXZIDE-25      TAKE these medications       acetaminophen 500 MG tablet  Commonly known as:  TYLENOL  Take 1,000 mg by mouth every 6 (six) hours as needed for mild pain.     ALPRAZolam 0.25 MG tablet  Commonly known as:  XANAX  Take 0.25-1 mg by mouth at bedtime as needed for sleep.     amiodarone 400 MG tablet  Commonly known as:  PACERONE  Take 1 tablet (400 mg total) by mouth 2 (two) times daily. For 7 days, then take 400 mg dailt     aspirin 81 MG EC tablet  Take 1 tablet (81 mg total) by mouth daily.     BESIVANCE 0.6 % Susp  Generic drug:  Besifloxacin HCl  Place 1 drop into the left eye 4 (four) times daily as needed (after injection). Use in left eye 4 times a day after each injection     CINNAMON PO  Take  2,000 mg by mouth daily.     CLONIDINE HCL PO  Take 0.2 mg by mouth 3 (three) times daily.     diphenhydrAMINE 25 MG tablet  Commonly known as:  BENADRYL  Take 25 mg by mouth every 6 (six) hours as needed for allergies.     furosemide 40 MG tablet  Commonly known as:  LASIX  Take 1 tablet (40 mg total) by mouth daily.     glimepiride 2 MG tablet  Commonly known as:  AMARYL  Take 2 mg by mouth daily before breakfast.     LIPITOR 20 MG tablet  Generic drug:  atorvastatin  Take 20 mg by mouth 3 (three) times  a week. Monday Wednesday and Friday     metFORMIN 500 MG 24 hr tablet  Commonly known as:  GLUCOPHAGE-XR  Take 1,000 mg by mouth 2 (two) times daily.     metoprolol tartrate 25 MG tablet  Commonly known as:  LOPRESSOR  Take 12.5 mg by mouth 2 (two) times daily.     minoxidil 10 MG tablet  Commonly known as:  LONITEN  Take 20 mg by mouth at bedtime.     nitroGLYCERIN 0.4 MG SL tablet  Commonly known as:  NITROSTAT  Place 1 tablet (0.4 mg total) under the tongue every 5 (five) minutes x 3 doses as needed for chest pain.     PRESERVISION AREDS PO  Take 1 capsule by mouth 2 (two) times daily.     ranitidine 150 MG tablet  Commonly known as:  ZANTAC  Take 150 mg by mouth daily as needed for heartburn.     sertraline 100 MG tablet  Commonly known as:  ZOLOFT  Take 100 mg by mouth daily.     valsartan 320 MG tablet  Commonly known as:  DIOVAN  Take 320 mg by mouth daily.     VICODIN 5-500 MG per tablet  Generic drug:  HYDROcodone-acetaminophen  Take 1 tablet by mouth every 6 (six) hours as needed for pain.     Vitamin D3 5000 UNITS Caps  Take 5,000 Units by mouth daily.     warfarin 5 MG tablet  Commonly known as:  COUMADIN  Take 1 tablet (5 mg total) by mouth daily at 6 PM.       Outstanding Labs/Studies  bmet @ f/u 4/22. INR @ coumadin clinic f/u next week.  Duration of Discharge Encounter   Greater than 30 minutes including physician  time.  Signed, Rogelia Mire NP 04/14/2013, 10:04 AM

## 2013-04-14 NOTE — Discharge Instructions (Signed)
***  PLEASE REMEMBER TO BRING ALL OF YOUR MEDICATIONS TO EACH OF YOUR FOLLOW-UP OFFICE VISITS.  

## 2013-04-17 DIAGNOSIS — N184 Chronic kidney disease, stage 4 (severe): Secondary | ICD-10-CM

## 2013-04-17 DIAGNOSIS — K2100 Gastro-esophageal reflux disease with esophagitis, without bleeding: Secondary | ICD-10-CM | POA: Insufficient documentation

## 2013-04-17 DIAGNOSIS — E1122 Type 2 diabetes mellitus with diabetic chronic kidney disease: Secondary | ICD-10-CM | POA: Insufficient documentation

## 2013-04-17 DIAGNOSIS — M1990 Unspecified osteoarthritis, unspecified site: Secondary | ICD-10-CM | POA: Insufficient documentation

## 2013-04-17 DIAGNOSIS — Z794 Long term (current) use of insulin: Secondary | ICD-10-CM

## 2013-04-18 ENCOUNTER — Encounter: Payer: Self-pay | Admitting: Physician Assistant

## 2013-04-18 ENCOUNTER — Ambulatory Visit (INDEPENDENT_AMBULATORY_CARE_PROVIDER_SITE_OTHER): Payer: Medicare Other | Admitting: Physician Assistant

## 2013-04-18 VITALS — BP 102/60 | HR 64 | Temp 98.1°F | Resp 16 | Ht 71.0 in | Wt 208.0 lb

## 2013-04-18 DIAGNOSIS — I2510 Atherosclerotic heart disease of native coronary artery without angina pectoris: Secondary | ICD-10-CM

## 2013-04-18 DIAGNOSIS — I5033 Acute on chronic diastolic (congestive) heart failure: Secondary | ICD-10-CM

## 2013-04-18 DIAGNOSIS — Z79899 Other long term (current) drug therapy: Secondary | ICD-10-CM

## 2013-04-18 DIAGNOSIS — I10 Essential (primary) hypertension: Secondary | ICD-10-CM

## 2013-04-18 LAB — MAGNESIUM: Magnesium: 2 mg/dL (ref 1.5–2.5)

## 2013-04-18 LAB — CBC WITH DIFFERENTIAL/PLATELET
BASOS PCT: 0 % (ref 0–1)
Basophils Absolute: 0 10*3/uL (ref 0.0–0.1)
Eosinophils Absolute: 0 10*3/uL (ref 0.0–0.7)
Eosinophils Relative: 0 % (ref 0–5)
HCT: 31.6 % — ABNORMAL LOW (ref 39.0–52.0)
Hemoglobin: 10.7 g/dL — ABNORMAL LOW (ref 13.0–17.0)
Lymphocytes Relative: 5 % — ABNORMAL LOW (ref 12–46)
Lymphs Abs: 0.7 10*3/uL (ref 0.7–4.0)
MCH: 30.8 pg (ref 26.0–34.0)
MCHC: 33.9 g/dL (ref 30.0–36.0)
MCV: 91.1 fL (ref 78.0–100.0)
Monocytes Absolute: 0.6 10*3/uL (ref 0.1–1.0)
Monocytes Relative: 4 % (ref 3–12)
NEUTROS ABS: 12.6 10*3/uL — AB (ref 1.7–7.7)
NEUTROS PCT: 91 % — AB (ref 43–77)
PLATELETS: 413 10*3/uL — AB (ref 150–400)
RBC: 3.47 MIL/uL — ABNORMAL LOW (ref 4.22–5.81)
RDW: 14.4 % (ref 11.5–15.5)
WBC: 13.9 10*3/uL — AB (ref 4.0–10.5)

## 2013-04-18 LAB — HEPATIC FUNCTION PANEL
ALT: 23 U/L (ref 0–53)
AST: 19 U/L (ref 0–37)
Albumin: 3.3 g/dL — ABNORMAL LOW (ref 3.5–5.2)
Alkaline Phosphatase: 90 U/L (ref 39–117)
BILIRUBIN DIRECT: 0.2 mg/dL (ref 0.0–0.3)
Indirect Bilirubin: 0.4 mg/dL (ref 0.2–1.2)
TOTAL PROTEIN: 5.9 g/dL — AB (ref 6.0–8.3)
Total Bilirubin: 0.6 mg/dL (ref 0.2–1.2)

## 2013-04-18 LAB — BASIC METABOLIC PANEL WITH GFR
BUN: 24 mg/dL — AB (ref 6–23)
CO2: 29 mEq/L (ref 19–32)
Calcium: 8.5 mg/dL (ref 8.4–10.5)
Chloride: 100 mEq/L (ref 96–112)
Creat: 1.27 mg/dL (ref 0.50–1.35)
GFR, EST AFRICAN AMERICAN: 64 mL/min
GFR, Est Non African American: 55 mL/min — ABNORMAL LOW
Glucose, Bld: 196 mg/dL — ABNORMAL HIGH (ref 70–99)
Potassium: 4 mEq/L (ref 3.5–5.3)
SODIUM: 137 meq/L (ref 135–145)

## 2013-04-18 LAB — PROTIME-INR
INR: 3.21 — ABNORMAL HIGH (ref <1.50)
Prothrombin Time: 31.9 seconds — ABNORMAL HIGH (ref 11.6–15.2)

## 2013-04-18 NOTE — Progress Notes (Signed)
Hospital follow up 73 y.o. presents for hospital follow up. S/P CABG x2 on 04/05/2013 with placement of a LIMA to the LAD and a vein graft to the ramus intermedius with a MAZE procedure at the same time. He presented to the ER with SOB/orthopnea/PND and slightly elevated BNP and troponin at that time. He had a normal echo and it was felt to be diastolic dysfunction. His maxide was discontinued and he was placed on Lasix 40mg  daily. He presents today for an INR and BMP check s/p hospital. He follows up with his cardiologist on the 22nd. His weight is up 8 lbs since his hospital discharge on 04/14/2013. He states his mediasternotomy is looking well, his harvest site on left medial leg has some bloody discharge but no erythema,tenderness or pus. He is doing his spirometer 10 times every hour but has not reached 1250 yet, walking 5 mins 3 times a day and he is very tired with dyspnea afterwards. He states when he walks he has exertional dyspnea, he has some edema bilateral legs, he can lay down flat and denies PND, his weight is up 8 lbs from the hospital. He is on 5mg  Coumadin daily. He has a dry cough but states that it feels like it is his throat and states it is allergies.  Patient's last INR is  Lab Results  Component Value Date   INR 1.93* 04/14/2013   INR 1.80* 04/13/2013   INR 1.78* 04/13/2013    Wt Readings from Last 3 Encounters:  04/18/13 208 lb (94.348 kg)  04/14/13 200 lb 9.6 oz (90.992 kg)  04/12/13 203 lb 4.2 oz (92.2 kg)     Current Outpatient Prescriptions on File Prior to Visit  Medication Sig Dispense Refill  . acetaminophen (TYLENOL) 500 MG tablet Take 1,000 mg by mouth every 6 (six) hours as needed for mild pain.       [name] ALPRAZolam (XANAX) 0.25 MG tablet Take 0.25-1 mg by mouth at bedtime as needed for sleep.       [name] amiodarone (PACERONE) 400 MG tablet Take 1 tablet (400 mg total) by mouth 2 (two) times daily. For 7 days, then take 400 mg dailt  70 tablet  1  . aspirin EC 81 MG EC  tablet Take 1 tablet (81 mg total) by mouth daily.      [name] atorvastatin (LIPITOR) 20 MG tablet Take 20 mg by mouth 3 (three) times a week. Monday Wednesday and Friday      . BESIVANCE 0.6 % SUSP Place 1 drop into the left eye 4 (four) times daily as needed (after injection). Use in left eye 4 times a day after each injection      . Cholecalciferol (VITAMIN D3) 5000 UNITS CAPS Take 5,000 Units by mouth daily.      [name] CINNAMON PO Take 2,000 mg by mouth daily.      [name] CLONIDINE HCL PO Take 0.2 mg by mouth 3 (three) times daily.       . diphenhydrAMINE (BENADRYL) 25 MG tablet Take 25 mg by mouth every 6 (six) hours as needed for allergies.       . furosemide (LASIX) 40 MG tablet Take 1 tablet (40 mg total) by mouth daily.  30 tablet  6  . glimepiride (AMARYL) 2 MG tablet Take 2 mg by mouth daily before breakfast.        . HYDROcodone-acetaminophen (VICODIN) 5-500 MG per tablet Take 1 tablet by mouth every 6 (six) hours as needed for  pain.      . metFORMIN (GLUCOPHAGE-XR) 500 MG 24 hr tablet Take 1,000 mg by mouth 2 (two) times daily.       . metoprolol tartrate (LOPRESSOR) 25 MG tablet Take 12.5 mg by mouth 2 (two) times daily.      . minoxidil (LONITEN) 10 MG tablet Take 20 mg by mouth at bedtime.      . Multiple Vitamins-Minerals (PRESERVISION AREDS PO) Take 1 capsule by mouth 2 (two) times daily.      . nitroGLYCERIN (NITROSTAT) 0.4 MG SL tablet Place 1 tablet (0.4 mg total) under the tongue every 5 (five) minutes x 3 doses as needed for chest pain.  25 tablet  3  . ranitidine (ZANTAC) 150 MG tablet Take 150 mg by mouth daily as needed for heartburn.       . sertraline (ZOLOFT) 100 MG tablet Take 100 mg by mouth daily.      . valsartan (DIOVAN) 320 MG tablet Take 320 mg by mouth daily.      [name] warfarin (COUMADIN) 5 MG tablet Take 1 tablet (5 mg total) by mouth daily at 6 PM.  100 tablet  1   No current facility-administered medications on file prior to visit.   Past Medical History  Diagnosis Date   . Hx of adenomatous colonic polyps   . Diabetes mellitus type II   . Hypertension   . Diverticulosis 2001  . Hyperlipidemia   . Persistent atrial fibrillation     a. on amio and coumadin;  b. s/p MAZE 04/2013 in setting of CABG.  . Obstructive sleep apnea     compliant with CPAP  . Chronic diastolic congestive heart failure   . Hypertensive cardiomyopathy   . History of cardioversion   . H/O hiatal hernia   . Cancer     Basal cell carcinoma  . Adrenal adenoma   . CAD (coronary artery disease)     a. 04/2013 CABG x 2: LIMA to LAD, SVG to RI, EVH via R thigh.  . S/P Maze operation for atrial fibrillation     a. 04/2013: Complete bilateral atrial lesion set using cryothermy and bipolar radiofrequency ablation with clipping of LA appendage (@ time of CABG)  . Type II or unspecified type diabetes mellitus without mention of complication, not stated as uncontrolled   . GERD (gastroesophageal reflux disease)   . Depression   . Anxiety   . DJD (degenerative joint disease)    Allergies  Allergen Reactions  . Horse-Derived Products Other (See Comments)    unknown  . Other Other (See Comments)    Tetanus Shot   (unknown reaction)  . Sunflower Seed [Sunflower Oil] Swelling    ROS Constitutional: + Fatigue Denies fever, chills, headaches Cardio: + dyspnea, edema Denies chest pain, palpitations, irregular heartbeat, syncope, diaphoresis, orthopnea, PND, claudication Respiratory: denies cough, dyspnea, DOE, pleurisy, hoarseness, laryngitis, wheezing.  Gastrointestinal: Denies dysphagia, heartburn, reflux, pain, cramps, nausea, diarrhea, constipation, hematemesis, melena, hematochezia Genitourinary: Denies dysuria, frequency, hematuria, flank pain Musculoskeletal: Denies arthralgia, myalgia, stiffness, Jt. Swelling, pain, limp, strain/sprain. Skin: Denies rash, ecchymosis, petechial. Neuro: Denies Weakness, tremor, incoordination, spasms, paresthesia, pain Heme/Lymph: Denies Excessive  bleeding, bruising, enlarged lymph nodes  Physical: Blood pressure 102/60, pulse 64, temperature 98.1 F (36.7 C), resp. rate 16, height 5\' 11"  (1.803 m), weight 208 lb (94.348 kg). Filed Weights   04/18/13 1053  Weight: 208 lb (94.348 kg)    General Appearance: Well nourished, in no apparent distress. ENT/Mouth: Nares clear  with no erythema, swelling, mucus on turbinates. No ulcers, cracking, on lips. No erythema, swelling, or exudate on post pharynx.  Neck: Supple, thyroid normal.  Chest: well healing medisternotomy scar Respiratory: CTAB, decrease breath sounds left lower lobe greater than right lower lobe.  Cardio: RRR, holosystolic murmur, no rubs or gallops. 2+ bilateral edema  Abdomen: Soft, with bowl sounds. Non tender, no guarding, rebound, hernias, masses, or organomegaly.  Skin: Warm, dry without rashes, lesions, ecchymosis. Right medial leg harvest site with some erythema, serious sanguinous discharge without warmth, tenderness.  Neuro: Unremarkable  Assessment and plan: Diastolic CHF- Decrease minoxidil to 1/2 pill daily due to swelling, check BMP and Mag, increase lasix to BID for the time being.  If any increasing shortness of breath, swelling, or chest pressure go to ER immediately. Decrease your sodium intake to less than 2000 mg daily, decrease your fluid intake to less than 2 L daily, elevate legs, weight yourself daily and please remember to always increase your potassium intake with any increase of your fluid pill.  CAD s/p CABG-  Cont spirometer, increase walking, decreased BS left lung base with weight up 8 lbs so lasix is increased, sites look good, follow up with cardio 1 week and will get CXR then Afib s/p maze on Chronic anticoagulation- check INR and will adjust medication according to labs.  Discussed if patient falls to immediately contact office or go to ER. Discussed foods that can increase or decrease Coumadin levels. Patient understands to call the office  before starting a new medication.Follow up in one month.

## 2013-04-18 NOTE — Patient Instructions (Signed)
Decrease minoxidil to 1/2 pill daily Increase lasix to 2 pills daily  Do the following things EVERYDAY: 1) Weigh yourself in the morning before breakfast. Write it down and keep it in a log. 2) Take your medicines as prescribed 3) Eat low salt foods-Limit salt (sodium) to 2000 mg per day.  4) Stay as active as you can everyday 5) Limit all fluids for the day to less than 2 liters   Coronary Artery Bypass Grafting, Care After Refer to this sheet in the next few weeks. These instructions provide you with information on caring for yourself after your procedure. Your health care provider may also give you more specific instructions. Your treatment has been planned according to current medical practices, but problems sometimes occur. Call your health care provider if you have any problems or questions after your procedure. WHAT TO EXPECT AFTER THE PROCEDURE Recovery from surgery will be different for everyone. Some people feel well after 3 or 4 weeks, while for others it takes longer. After your procedure, it is typical to have the following:  Nausea and a lack of appetite.   Constipation.  Weakness and fatigue.   Depression or irritability.   Pain or discomfort at your incision site. HOME CARE INSTRUCTIONS  Only take over-the-counter or prescription medicines as directed by your health care provider. Take all medicines exactly as directed. Do not stop taking medicines or start any new medicines without first checking with your health care provider.   Take your pulse as directed by your health care provider.  Perform deep breathing as directed by your health care provider. If you were given a device called an incentive spirometer, use it to practice deep breathing several times a day. Support your chest with a pillow or your arms when you take deep breaths or cough.  Keep incision areas clean, dry, and protected. Remove or change any bandages (dressings) only as directed by your  health care provider. You may have skin adhesive strips over the incision areas. Do not take the strips off. They will fall off on their own.  Check incision areas daily for any swelling, redness, or drainage.  If incisions were made in your legs, do the following:  Avoid crossing your legs.   Avoid sitting for long periods of time. Change positions every 30 minutes.   Elevate your legs when you are sitting.   Wear compression stockings as directed by your health care provider. These stockings help keep blood clots from forming in your legs.  Take showers once your health care provider approves. Until then, only take sponge baths. Pat incisions dry. Do not rub incisions with a washcloth or towel. Do not take tub baths or go swimming until your health care provider approves.  Eat foods that are high in fiber, such as raw fruits and vegetables, whole grains, beans, and nuts. Meats should be lean cut. Avoid canned, processed, and fried foods.  Drink enough fluids to keep your urine clear or pale yellow.  Weigh yourself every day. This helps identify if you are retaining fluid that may make your heart and lungs work harder.   Rest and limit activity as directed by your health care provider. You may be instructed to:  Stop any activity at once if you have chest pain, shortness of breath, irregular heartbeats, or dizziness. Get help right away if you have any of these symptoms.  Move around frequently for short periods or take short walks as directed by your health care provider.  Increase your activities gradually. You may need physical therapy or cardiac rehabilitation to help strengthen your muscles and build your endurance.  Avoid lifting, pushing, or pulling anything heavier than 10 lb (4.5 kg) for at least 6 weeks after surgery.  Do not drive until your health care provider approves.  Ask your health care provider when you may return to work and resume sexual activity.  Follow  up with your health care provider as directed.  SEEK MEDICAL CARE IF:  You have swelling, redness, increasing pain, or drainage at the site of an incision.   You develop a fever.   You have swelling in your ankles or legs.   You have pain in your legs.   You have weight gain of 2 or more pounds a day.  You are nauseous or vomit.  You have diarrhea. SEEK IMMEDIATE MEDICAL CARE IF:  You have chest pain that goes to your jaw or arms.  You have shortness of breath.   You have a fast or irregular heartbeat.   You notice a "clicking" in your breastbone (sternum) when you move.   You have numbness or weakness in your arms or legs.  You feel dizzy or lightheaded.  MAKE SURE YOU:  Understand these instructions.  Will watch your condition.  Will get help right away if you are not doing well or get worse. Document Released: 07/11/2004 Document Revised: 08/24/2012 Document Reviewed: 05/31/2012 [facility] Patient Information 2014 [place].

## 2013-04-21 ENCOUNTER — Other Ambulatory Visit: Payer: Self-pay | Admitting: Internal Medicine

## 2013-04-25 ENCOUNTER — Other Ambulatory Visit: Payer: Self-pay | Admitting: Thoracic Surgery (Cardiothoracic Vascular Surgery)

## 2013-04-25 DIAGNOSIS — I5032 Chronic diastolic (congestive) heart failure: Secondary | ICD-10-CM

## 2013-04-26 ENCOUNTER — Encounter: Payer: Self-pay | Admitting: Physician Assistant

## 2013-04-26 ENCOUNTER — Ambulatory Visit
Admission: RE | Admit: 2013-04-26 | Discharge: 2013-04-26 | Disposition: A | Discharge status: Home / Self Care | Payer: Medicare Other | Source: Ambulatory Visit | Attending: Thoracic Surgery (Cardiothoracic Vascular Surgery) | Admitting: Thoracic Surgery (Cardiothoracic Vascular Surgery)

## 2013-04-26 ENCOUNTER — Ambulatory Visit (INDEPENDENT_AMBULATORY_CARE_PROVIDER_SITE_OTHER): Payer: Medicare Other | Admitting: Physician Assistant

## 2013-04-26 ENCOUNTER — Other Ambulatory Visit: Payer: Self-pay | Admitting: *Deleted

## 2013-04-26 ENCOUNTER — Ambulatory Visit (INDEPENDENT_AMBULATORY_CARE_PROVIDER_SITE_OTHER): Payer: Medicare Other | Admitting: Thoracic Surgery (Cardiothoracic Vascular Surgery)

## 2013-04-26 VITALS — BP 94/59 | HR 56 | Resp 16

## 2013-04-26 VITALS — BP 110/69 | HR 59 | Ht 71.0 in | Wt 204.0 lb

## 2013-04-26 DIAGNOSIS — Z9889 Other specified postprocedural states: Secondary | ICD-10-CM

## 2013-04-26 DIAGNOSIS — Z8679 Personal history of other diseases of the circulatory system: Secondary | ICD-10-CM

## 2013-04-26 DIAGNOSIS — J90 Pleural effusion, not elsewhere classified: Secondary | ICD-10-CM

## 2013-04-26 DIAGNOSIS — I4891 Unspecified atrial fibrillation: Secondary | ICD-10-CM

## 2013-04-26 DIAGNOSIS — Z951 Presence of aortocoronary bypass graft: Secondary | ICD-10-CM

## 2013-04-26 DIAGNOSIS — I509 Heart failure, unspecified: Secondary | ICD-10-CM

## 2013-04-26 DIAGNOSIS — S91009A Unspecified open wound, unspecified ankle, initial encounter: Secondary | ICD-10-CM

## 2013-04-26 DIAGNOSIS — I2510 Atherosclerotic heart disease of native coronary artery without angina pectoris: Secondary | ICD-10-CM

## 2013-04-26 DIAGNOSIS — E785 Hyperlipidemia, unspecified: Secondary | ICD-10-CM

## 2013-04-26 DIAGNOSIS — I5032 Chronic diastolic (congestive) heart failure: Secondary | ICD-10-CM

## 2013-04-26 DIAGNOSIS — I4819 Other persistent atrial fibrillation: Secondary | ICD-10-CM

## 2013-04-26 DIAGNOSIS — I119 Hypertensive heart disease without heart failure: Secondary | ICD-10-CM

## 2013-04-26 DIAGNOSIS — S81801A Unspecified open wound, right lower leg, initial encounter: Secondary | ICD-10-CM

## 2013-04-26 DIAGNOSIS — S81009A Unspecified open wound, unspecified knee, initial encounter: Secondary | ICD-10-CM

## 2013-04-26 DIAGNOSIS — S81809A Unspecified open wound, unspecified lower leg, initial encounter: Secondary | ICD-10-CM

## 2013-04-26 LAB — BASIC METABOLIC PANEL
BUN: 20 mg/dL (ref 6–23)
CALCIUM: 8.7 mg/dL (ref 8.4–10.5)
CHLORIDE: 103 meq/L (ref 96–112)
CO2: 28 mEq/L (ref 19–32)
CREATININE: 1.3 mg/dL (ref 0.4–1.5)
GFR: 58.38 mL/min — ABNORMAL LOW (ref >60.00)
Glucose, Bld: 194 mg/dL — ABNORMAL HIGH (ref 70–99)
Potassium: 4.4 mEq/L (ref 3.5–5.1)
Sodium: 138 mEq/L (ref 135–145)

## 2013-04-26 LAB — BRAIN NATRIURETIC PEPTIDE: BRAIN NATRIURETIC PEPTIDE: 337.6 pg/mL — AB (ref 0.0–100.0)

## 2013-04-26 MED ORDER — FUROSEMIDE 40 MG PO TABS: 80.0000 mg | ORAL_TABLET | Freq: Two times a day (BID) | ORAL | Status: DC

## 2013-04-26 MED ORDER — AMIODARONE HCL 200 MG PO TABS: 200.0000 mg | ORAL_TABLET | Freq: Two times a day (BID) | ORAL | Status: DC

## 2013-04-26 NOTE — Patient Instructions (Signed)
Increase Lasix to 80 mg by mouth twice daily. Continue to record your weight on a daily basis.

## 2013-04-26 NOTE — Progress Notes (Signed)
[place] [place], [place]  [address] Phone: [phone] Fax:  [phone]  Date:  04/26/2013   ID:  [name], DOB 01-12-1940, MRN [id number]  PCP:  Alesia Richards, MD  Cardiologist:  Dr. Liam Rogers     History of Present Illness: [name] is a 73 y.o. male with a hx of AFib, hypertensive heart disease, HL, diabetes.  He was seen by Dr. Rolland Porter at [facility] in the past but did not pursue ablation for AFib.  He has failed Tikosyn in the past.  He has been reluctant to take anticoagulation in the past due to bleeding problems.   He was seen by Dr. Thompson Grayer recently for alternate therapies for symptomatic AFib.  It was ultimately felt he would benefit from surgical MAZE and was referred to Dr. Roxy Manns.  He underwent cardiac cath as part of his workup for surgical MAZE and was found to have distal LM, ostial LAD, ostial RI, and ostial CFX disease.  He was referred for CABG as well.    He was admitted 4/1-4/8 and underwent CABG (LIMA-LAD, SVG-RI), MAZE procedure and clipping of left atrial appendage with Dr. Roxy Manns.  In the postoperative period, he had sinus rhythm briefly but then reverted back to atrial fibrillation. The remainder of his postoperative course was fairly uneventful. He was readmitted 4/9-4/10 with acute on chronic diastolic CHF. He presented with orthopnea and dyspnea at rest. Point-of-care troponin was mildly elevated at 0.47. This was felt to be related to his recent open-heart surgery and not an acute event. He had symptomatic improvement with one dose of IV Lasix. Echocardiogram demonstrated normal LV function with severe LVH.  He remained in AFib with controlled rate.  Patient notes little improvement since discharge in the hospital. His weights have been stable. He did not feel he has had significant improvement in his LE edema. He has a wound at his right leg SVG harvesting site that continues to ooze. He denies any fever. He does note occasional dyspnea.  He has slept on an incline for years. He denies PND. He uses CPAP at night. His chest is sore but is improving. He denies cough.  Studies:  - LHC (03/07/13):  dLM 50%, ostial LAD 50%, ostial D1 70%, ostial D2 50%, ostial CFX 95%, ostial RI 95%, EF 65-70%.    - Echo (04/13/13):  Severe LVH, EF 55-60%, no RWMA, mild RAE.    - Carotid US (04/03/13):  Bilateral ICA 1-39%.   Recent Labs: 02/06/2013: HDL Cholesterol 34.80*; LDL (calc) 73  04/13/2013: Pro B Natriuretic peptide (BNP) 2226.0*  04/18/2013: ALT 23; Creatinine 1.27; Hemoglobin 10.7*; Potassium 4.0   Wt Readings from Last 3 Encounters:  04/18/13 208 lb (94.348 kg)  04/14/13 200 lb 9.6 oz (90.992 kg)  04/12/13 203 lb 4.2 oz (92.2 kg)     Past Medical History  Diagnosis Date  . Hx of adenomatous colonic polyps   . Diabetes mellitus type II   . Hypertension   . Diverticulosis 2001  . Hyperlipidemia   . Persistent atrial fibrillation     a. on amio and coumadin;  b. s/p MAZE 04/2013 in setting of CABG.  . Obstructive sleep apnea     compliant with CPAP  . Chronic diastolic congestive heart failure   . Hypertensive cardiomyopathy   . History of cardioversion   . H/O hiatal hernia   . Cancer     Basal cell carcinoma  . Adrenal adenoma   .  CAD (coronary artery disease)     a. 04/2013 CABG x 2: LIMA to LAD, SVG to RI, EVH via R thigh.  . S/P Maze operation for atrial fibrillation     a. 04/2013: Complete bilateral atrial lesion set using cryothermy and bipolar radiofrequency ablation with clipping of LA appendage (@ time of CABG)  . Type II or unspecified type diabetes mellitus without mention of complication, not stated as uncontrolled   . GERD (gastroesophageal reflux disease)   . Depression   . Anxiety   . DJD (degenerative joint disease)     Current Outpatient Prescriptions  Medication Sig Dispense Refill  . acetaminophen (TYLENOL) 500 MG tablet Take 1,000 mg by mouth every 6 (six) hours as needed for mild pain.       [name]  ALPRAZolam (XANAX) 0.25 MG tablet Take 0.25-1 mg by mouth at bedtime as needed for sleep.       [name] amiodarone (PACERONE) 400 MG tablet Take 1 tablet (400 mg total) by mouth 2 (two) times daily. For 7 days, then take 400 mg dailt  70 tablet  1  . aspirin EC 81 MG EC tablet Take 1 tablet (81 mg total) by mouth daily.      [name] atorvastatin (LIPITOR) 20 MG tablet Take 20 mg by mouth 3 (three) times a week. Monday Wednesday and Friday      . BESIVANCE 0.6 % SUSP Place 1 drop into the left eye 4 (four) times daily as needed (after injection). Use in left eye 4 times a day after each injection      . Cholecalciferol (VITAMIN D3) 5000 UNITS CAPS Take 5,000 Units by mouth daily.      [name] CINNAMON PO Take 2,000 mg by mouth daily.      [name] CLONIDINE HCL PO Take 0.2 mg by mouth 3 (three) times daily.       . diphenhydrAMINE (BENADRYL) 25 MG tablet Take 25 mg by mouth every 6 (six) hours as needed for allergies.       . furosemide (LASIX) 40 MG tablet Take 1 tablet (40 mg total) by mouth daily.  30 tablet  6  . glimepiride (AMARYL) 2 MG tablet Take 2 mg by mouth daily before breakfast.        . HYDROcodone-acetaminophen (VICODIN) 5-500 MG per tablet Take 1 tablet by mouth every 6 (six) hours as needed for pain.      . metFORMIN (GLUCOPHAGE-XR) 500 MG 24 hr tablet TAKE 1 TO 2 TABLETS TWICE DAILY  360 tablet  4  . metoprolol tartrate (LOPRESSOR) 25 MG tablet Take 12.5 mg by mouth 2 (two) times daily.      . minoxidil (LONITEN) 10 MG tablet Take 20 mg by mouth at bedtime.      . Multiple Vitamins-Minerals (PRESERVISION AREDS PO) Take 1 capsule by mouth 2 (two) times daily.      . nitroGLYCERIN (NITROSTAT) 0.4 MG SL tablet Place 1 tablet (0.4 mg total) under the tongue every 5 (five) minutes x 3 doses as needed for chest pain.  25 tablet  3  . ranitidine (ZANTAC) 150 MG tablet Take 150 mg by mouth daily as needed for heartburn.       . sertraline (ZOLOFT) 100 MG tablet Take 100 mg by mouth daily.      . valsartan  (DIOVAN) 320 MG tablet Take 320 mg by mouth daily.      [name] warfarin (COUMADIN) 5 MG tablet Take 1 tablet (5 mg total) by  mouth daily at 6 PM.  100 tablet  1   No current facility-administered medications for this visit.    Allergies:   Horse-derived products; Other; and Sunflower seed   Social History:  The patient  reports that he quit smoking about 32 years ago. His smoking use included Cigarettes. He smoked 4.00 packs per day. He has never used smokeless tobacco. He reports that he drinks alcohol. He reports that he does not use illicit drugs.   Family History:  The patient's family history includes Atrial fibrillation in his mother; Colon cancer in his mother; Colon polyps in his mother and sister; Dementia in his father; Diabetes in his maternal uncle; Hypertension in his mother; Stroke in his paternal uncle.   ROS:  Please see the history of present illness.      All other systems reviewed and negative.   PHYSICAL EXAM: VS:  BP 110/69  Pulse 59  Ht 5\' 11"  (1.803 m)  Wt 204 lb (92.534 kg)  BMI 28.46 kg/m2 Well nourished, well developed, in no acute distress HEENT: normal Neck:  + JVD Cardiac:  normal S1, S2; irreg irreg rhythm; no murmur Lungs:  Decreased breath sounds at the L base with E->A changes, no rales Abd: soft, nontender, no hepatomegaly Ext: 1-2+ bilateral LE edema; large area of ecchymosis and hematoma surrounding SVG harvesting site; wound is somewhat open with serosanguinous drainage. No purulent drainage noted. Skin: warm and dry Neuro:  CNs 2-12 intact, no focal abnormalities noted  EKG:  AFib, HR 59, rightward axis, inferolateral TWI, no change from prior tracing     ASSESSMENT AND PLAN:  1. Coronary atherosclerosis of native coronary artery:  He is making slow progress after recent CABG. He continues to have LE edema. He has follow up with Dr. Roxy Manns next week.  Continue aspirin, statin. Refer to cardiac rehabilitation. 2. Chronic diastolic CHF (congestive heart  failure): He continues to have significant LE edema. He had a left pleural effusion on recent chest x-ray. His exam suggests that he continues to have this. His PCP recently increased his Lasix to twice a day. I have recommended he continue this dose for now. We will check a follow up basic metabolic panel today. He has follow up with Dr. Acie Fredrickson 5/22. He will keep this appointment. I have asked him to contact our office over the next week if his edema is not improving. 3. Atrial fibrillation:  He remains in atrial fibrillation with controlled rate. He is now on amiodarone 200 mg twice a day. He is status post MAZE procedure.  He will likely have return of NSR as he heals from his surgery. We can certainly consider cardioversion in the next couple of months if he does not return to NSR. 4. Hypertensive Heart Disease:  Blood pressure is controlled. 5. Hyperlipidemia : Continue statin. 6. Leg Wound:  I do not suspect infection. However, I will have him seen at Dr. Guy Sandifer office today to further assess his leg wound. 7. Disposition: Keep follow up with Dr. Acie Fredrickson next month as planned. As noted above, the patient will contact us next week if his edema is not improving. We can see him back sooner at that point.  Signed, Richardson Dopp, PA-C  04/26/2013 11:01 AM

## 2013-04-26 NOTE — Progress Notes (Signed)
[name]       [place] [address]             [phone]     CARDIOTHORACIC SURGERY OFFICE NOTE  Referring Provider is Thompson Grayer, MD PCP is Alesia Richards, MD   HPI:  Patient returns for followup status post coronary artery bypass grafting x2 and Maze procedure on 04/05/2013.  His early postoperative course in the hospital was uncomplicated although he did go back into rate-controlled atrial fibrillation.  After his initial hospital discharge she was readmitted to the hospital briefly on April 9 with increased shortness of breath and orthopnea. He was noted to have a small left pleural effusion on portable chest x-ray at that time and increased lower extremity edema. A followup echocardiogram was performed demonstrating normal left ventricular function with no pericardial effusion. Symptoms improved with intravenous Lasix and he was again discharged home. He was seen in followup last week at his primary care physician's office. His weight was up 8 pounds and he was in instructed to increase his Lasix to 40 mg twice daily.  He was seen earlier today by [name] at the cardiology office and noted to have some drainage from his right thigh saphenous vein harvest incision. He was sent to our office to be seen.  He states that over the past 2 weeks he has made some gradual improvement but he still has exertional shortness of breath and fatigue. He denies resting shortness of breath or orthopnea. He still has significant bilateral lower extremity edema which is considerably worse on the right side in comparison with the left. He has been draining some thin serosanguineous fluid from the incision in his right thigh. He has not had fevers or chills. He has no chest discomfort.   Current Outpatient Prescriptions  Medication Sig Dispense Refill  . acetaminophen (TYLENOL) 500 MG tablet Take 1,000 mg by mouth every 6 (six) hours as needed for mild pain.       [name]  ALPRAZolam (XANAX) 0.25 MG tablet Take 0.25-1 mg by mouth at bedtime as needed for sleep.       [name] amiodarone (PACERONE) 200 MG tablet Take 1 tablet (200 mg total) by mouth 2 (two) times daily.      [name] amLODipine (NORVASC) 10 MG tablet Take 10 mg by mouth 2 (two) times daily.       [name] aspirin EC 81 MG EC tablet Take 1 tablet (81 mg total) by mouth daily.      [name] atorvastatin (LIPITOR) 20 MG tablet Take 20 mg by mouth 3 (three) times a week. Monday Wednesday and Friday      . BESIVANCE 0.6 % SUSP Place 1 drop into the left eye 4 (four) times daily as needed (after injection). Use in left eye 4 times a day after each injection      . Cholecalciferol (VITAMIN D3) 5000 UNITS CAPS Take 5,000 Units by mouth daily.      [name] CINNAMON PO Take 2,000 mg by mouth daily.      [name] CLONIDINE HCL PO Take 0.2 mg by mouth 3 (three) times daily.       . diphenhydrAMINE (BENADRYL) 25 MG tablet Take 25 mg by mouth every 6 (six) hours as needed for allergies.       . furosemide (LASIX) 40 MG tablet Take 40 mg by mouth 2 (two) times daily.      [name] glimepiride (AMARYL) 2 MG tablet Take 2  mg by mouth daily before breakfast.        . HYDROcodone-acetaminophen (VICODIN) 5-500 MG per tablet Take 1 tablet by mouth every 6 (six) hours as needed for pain.      . metFORMIN (GLUCOPHAGE-XR) 500 MG 24 hr tablet TAKE 1 TO 2 TABLETS TWICE DAILY  360 tablet  4  . metoprolol tartrate (LOPRESSOR) 25 MG tablet Take 12.5 mg by mouth 2 (two) times daily.      . minoxidil (LONITEN) 10 MG tablet Take 10 mg by mouth at bedtime.       . Multiple Vitamins-Minerals (PRESERVISION AREDS PO) Take 1 capsule by mouth 2 (two) times daily.      . nitroGLYCERIN (NITROSTAT) 0.4 MG SL tablet Place 1 tablet (0.4 mg total) under the tongue every 5 (five) minutes x 3 doses as needed for chest pain.  25 tablet  3  . ranitidine (ZANTAC) 150 MG tablet Take 150 mg by mouth daily as needed for heartburn.       . sertraline (ZOLOFT) 100 MG tablet Take 100 mg by mouth  daily.      . valsartan (DIOVAN) 320 MG tablet Take 320 mg by mouth daily.      [name] warfarin (COUMADIN) 5 MG tablet Take 5 mg by mouth daily at 6 PM. TAKE 5 MG S,M,W,F,S  AND  1/2 TAB 2.5  T,THURS..OR AS DIRECTED       No current facility-administered medications for this visit.      Physical Exam:   BP 94/59  Pulse 56  Resp 16  SpO2 96%  General:  Well-appearing  Chest:   Slightly diminished breath sounds left lung base, otherwise clear  CV:   Regular rate and rhythm  Incisions:  Sternotomy incision is healing nicely, sternum is stable  Abdomen:  Soft nontender  Extremities:  Warm and well-perfused. There is moderate-severe right lower extremity edema and mild-moderate left lower extremity edema.  There is serosanguineous drainage from one of the small endoscopic vein harvest incisions in the right thigh. There is no surrounding cellulitis. With probing the incision there is hematoma in the subcutaneous tract. There is no sign of purulent drainage nor other signs of infection.    Diagnostic Tests:  PORTABLE CHEST - 1 VIEW   COMPARISON:  04/10/2013   FINDINGS: Lungs are adequately inflated and demonstrate mild prominence of the perihilar markings suggesting mild vascular congestion. There is continued opacification over the left base/ retrocardiac region which is unchanged to slightly worse. This may represent a combination of left effusion with atelectasis although cannot exclude infection. There stable cardiomegaly. Remainder the exam is unchanged.   IMPRESSION: Cardiomegaly with mild vascular congestion. Persistent opacification over the left base which is unchanged to slightly worse and may be due to effusion with atelectasis, although cannot exclude infection.     Electronically Signed   By: Marin Olp M.D.   On: 04/13/2013 02:39   Transthoracic Echocardiography  Patient:    [name], [name] MR #:       [id number] Study Date: 04/13/2013 Gender:     M Age:         73 Height:     180.3cm Weight:     91.2kg BSA:        2.61m^2 Pt. Status: Room:       2W10C    ADMITTING    [place], Marne    [place], Peter  ORDERING     Hermann, Daniel  REFERRING  Theressa Stamps  SONOGRAPHER  Gates, Inpatient cc:  ------------------------------------------------------------ LV EF: 55% -   60%  ------------------------------------------------------------ Indications:      Shortness of breath 786.05.  ------------------------------------------------------------ History:   PMH:  Pericardial Rub.  Atrial fibrillation. Coronary artery disease.  Congestive heart failure.  Risk factors:  Hypertension. Diabetes mellitus. Dyslipidemia.  ------------------------------------------------------------ Study Conclusions  - Left ventricle: The cavity size was normal. Wall thickness   was increased in a pattern of severe LVH. Systolic   function was normal. The estimated ejection fraction was   in the range of 55% to 60%. Wall motion was normal; there   were no regional wall motion abnormalities. - Right atrium: The atrium was mildly dilated. - Atrial septum: No subcostal images done - Pericardium, extracardiac: NO pericardial effusion seen on   apical or parasternal views No subcostal views done  ------------------------------------------------------------ Labs, prior tests, procedures, and surgery: Coronary artery bypass grafting.  Transthoracic echocardiography.  M-mode, complete 2D, spectral Doppler, and color Doppler.  Height:  Height: 180.3cm. Height: 71in.  Weight:  Weight: 91.2kg. Weight: 200.6lb.  Body mass index:  BMI: 28kg/m^2.  Body surface area:    BSA: 2.2m^2.  Patient status:  Inpatient.  ------------------------------------------------------------  ------------------------------------------------------------ Left ventricle:  The cavity size was normal. Wall thickness was increased in a pattern of severe  LVH. Systolic function was normal. The estimated ejection fraction was in the range of 55% to 60%. Wall motion was normal; there were no regional wall motion abnormalities.  ------------------------------------------------------------ Aortic valve:   Trileaflet; normal thickness leaflets. Mobility was not restricted.  Doppler:  Transvalvular velocity was within the normal range. There was no stenosis.  No regurgitation.  ------------------------------------------------------------ Aorta:  Aortic root: The aortic root was normal in size.  ------------------------------------------------------------ Mitral valve:   Structurally normal valve.   Mobility was not restricted.  Doppler:  Transvalvular velocity was within the normal range. There was no evidence for stenosis.  No regurgitation.  ------------------------------------------------------------ Left atrium:  The atrium was normal in size.  ------------------------------------------------------------ Atrial septum:  No subcostal images done  ------------------------------------------------------------ Right ventricle:  The cavity size was normal. Wall thickness was normal. Systolic function was normal.  ------------------------------------------------------------ Pulmonic valve:    Doppler:  Transvalvular velocity was within the normal range. There was no evidence for stenosis.   ------------------------------------------------------------ Tricuspid valve:   Structurally normal valve.    Doppler: Transvalvular velocity was within the normal range.  Mild regurgitation.  ------------------------------------------------------------ Pulmonary artery:   The main pulmonary artery was normal-sized. Systolic pressure was within the normal range.   ------------------------------------------------------------ Right atrium:  The atrium was mildly dilated.  ------------------------------------------------------------ Pericardium:   NO pericardial effusion seen on apical or parasternal views No subcostal views done There was no pericardial effusion.  ------------------------------------------------------------ Systemic veins: Inferior vena cava: The vessel was normal in size.  ------------------------------------------------------------  2D measurements        Normal  Doppler measurements   Normal Left ventricle                 Tricuspid valve LVID ED,   40.2 mm     43-52   Regurg peak   317 cm/s ------ chord,                         vel PLAX  Peak RV-RA     40 mm   ------ LVID ES,   25.1 mm     23-38   gradient, S       Hg chord, PLAX FS, chord,   38 %      >29 PLAX LVPW, ED   15.6 mm     ------ IVS/LVPW   1.17        <1.3 ratio, ED Ventricular septum IVS, ED    18.2 mm     ------ Aorta Root diam,   45 mm     ------ ED Left atrium AP dim       32 mm     ------ AP dim     1.52 cm/m^2 <2.2 index   ------------------------------------------------------------ Prepared and Electronically Authenticated by  Jenkins Rouge 2015-04-09T13:42:12.210   Impression:  Patient has persistent long overload with acute exacerbation of chronic diastolic congestive heart failure. He also has hematoma in the right thigh endoscopic vein harvest tract with associated increased swelling and serosanguineous drainage in his right lower extremity. There is no sign of ongoing infection.  He probably still has a left pleural effusion   Plan:  We will obtain followup chest x-ray and blood work today. I've instructed the patient increase his dose of Lasix to 80 mg twice daily. He will return to the office this coming Monday for followup. Between now and then he will be seen every day by one of our nurses for wound checks.   [place] Gu. Roxy Manns, MD 04/26/2013 1:38 PM

## 2013-04-26 NOTE — Patient Instructions (Addendum)
PER SCOTT WEAVER, PAC WE ARE HAVING YOU SEE DR. [name] TODAY TO LOOK AT YOUR RIGHT LEG AFTER YOUR APPT HERE WITH [place].  LAB WORK TODAY; BMET  You have been referred to [place] IF YOUR SWELLING IS NOT ANY BETTER AFTER 1 WEEK [phone]  MAKE SURE TO KEEP YOUR FOLLOW UP WITH DR. Acie Fredrickson 05/26/13

## 2013-04-27 ENCOUNTER — Telehealth: Payer: Self-pay | Admitting: *Deleted

## 2013-04-27 ENCOUNTER — Other Ambulatory Visit: Payer: Self-pay | Admitting: *Deleted

## 2013-04-27 LAB — CBC
HCT: 33.7 % — ABNORMAL LOW (ref 39.0–52.0)
Hemoglobin: 11 g/dL — ABNORMAL LOW (ref 13.0–17.0)
MCH: 30.1 pg (ref 26.0–34.0)
MCHC: 32.6 g/dL (ref 30.0–36.0)
MCV: 92.3 fL (ref 78.0–100.0)
Platelets: 483 10*3/uL — ABNORMAL HIGH (ref 150–400)
RBC: 3.65 MIL/uL — ABNORMAL LOW (ref 4.22–5.81)
RDW: 14.6 % (ref 11.5–15.5)
WBC: 7.6 10*3/uL (ref 4.0–10.5)

## 2013-04-27 NOTE — Telephone Encounter (Signed)
pt notified about lab results with verbal understanding  

## 2013-04-27 NOTE — Progress Notes (Signed)
ERRONEOUS ENCOUNTER

## 2013-05-01 ENCOUNTER — Ambulatory Visit
Admission: RE | Admit: 2013-05-01 | Discharge: 2013-05-01 | Disposition: A | Discharge status: Home / Self Care | Payer: Medicare Other | Source: Ambulatory Visit | Attending: Thoracic Surgery (Cardiothoracic Vascular Surgery) | Admitting: Thoracic Surgery (Cardiothoracic Vascular Surgery)

## 2013-05-01 ENCOUNTER — Ambulatory Visit (INDEPENDENT_AMBULATORY_CARE_PROVIDER_SITE_OTHER): Payer: Medicare Other | Admitting: Physician Assistant

## 2013-05-01 ENCOUNTER — Ambulatory Visit: Payer: Medicare Other | Admitting: Thoracic Surgery (Cardiothoracic Vascular Surgery)

## 2013-05-01 VITALS — BP 114/72 | HR 67 | Resp 16 | Ht 71.0 in | Wt 208.0 lb

## 2013-05-01 DIAGNOSIS — I2510 Atherosclerotic heart disease of native coronary artery without angina pectoris: Secondary | ICD-10-CM

## 2013-05-01 DIAGNOSIS — I4819 Other persistent atrial fibrillation: Secondary | ICD-10-CM

## 2013-05-01 DIAGNOSIS — I5032 Chronic diastolic (congestive) heart failure: Secondary | ICD-10-CM

## 2013-05-01 DIAGNOSIS — I4891 Unspecified atrial fibrillation: Secondary | ICD-10-CM

## 2013-05-01 DIAGNOSIS — Z9889 Other specified postprocedural states: Secondary | ICD-10-CM

## 2013-05-01 DIAGNOSIS — Z8679 Personal history of other diseases of the circulatory system: Secondary | ICD-10-CM

## 2013-05-01 DIAGNOSIS — Z951 Presence of aortocoronary bypass graft: Secondary | ICD-10-CM

## 2013-05-01 NOTE — Progress Notes (Signed)
HPI:  [name] is S/P CABG x 2 and MAZE procedure, that was performed 04/05/13.   He was evaluated by Dr. Roxy Manns last Monday 04/26/2013 with complaints of oozing from his [facility] site in his right thigh.  He was also complaining of LE edema and some shortness of breath.  He was instructed to pack wound daily and to increase his Lasix to 80 mg BID.  The patient presents to the office today for 1 week follow up.  He states he right EVH site is still draining, but he thinks that improved.  His lower extremity edema remains the same.  However patient states that he feels dry, being that he is unable to open his mouth in the morning since it is so dry.  Therefore, he decreased his Lasix dose to 80 mg once a day.  In regards to being short of breath the patient states that he would say he gets winded instead of shortness of breath.  He states he is able to ambulate with minimal difficulty and can sleep flat with no complaints.   Current Outpatient Prescriptions  Medication Sig Dispense Refill  . acetaminophen (TYLENOL) 500 MG tablet Take 1,000 mg by mouth every 6 (six) hours as needed for mild pain.       [name] ALPRAZolam (XANAX) 0.25 MG tablet Take 0.25-1 mg by mouth at bedtime as needed for sleep.       [name] amiodarone (PACERONE) 200 MG tablet Take 1 tablet (200 mg total) by mouth 2 (two) times daily.      [name] amLODipine (NORVASC) 10 MG tablet Take 10 mg by mouth 2 (two) times daily.       [name] aspirin EC 81 MG EC tablet Take 1 tablet (81 mg total) by mouth daily.      [name] atorvastatin (LIPITOR) 20 MG tablet Take 20 mg by mouth 3 (three) times a week. Monday Wednesday and Friday      . BESIVANCE 0.6 % SUSP Place 1 drop into the left eye 4 (four) times daily as needed (after injection). Use in left eye 4 times a day after each injection      . Cholecalciferol (VITAMIN D3) 5000 UNITS CAPS Take 5,000 Units by mouth daily.      [name] CINNAMON PO Take 2,000 mg by mouth daily.      [name] CLONIDINE HCL PO Take 0.2 mg by mouth 3 (three)  times daily.       . diphenhydrAMINE (BENADRYL) 25 MG tablet Take 25 mg by mouth every 6 (six) hours as needed for allergies.       . furosemide (LASIX) 40 MG tablet Take 2 tablets (80 mg total) by mouth 2 (two) times daily.  30 tablet  1  . glimepiride (AMARYL) 2 MG tablet Take 2 mg by mouth daily before breakfast.        . HYDROcodone-acetaminophen (VICODIN) 5-500 MG per tablet Take 1 tablet by mouth every 6 (six) hours as needed for pain.      . metFORMIN (GLUCOPHAGE-XR) 500 MG 24 hr tablet TAKE 1 TO 2 TABLETS TWICE DAILY  360 tablet  4  . metoprolol tartrate (LOPRESSOR) 25 MG tablet Take 12.5 mg by mouth 2 (two) times daily.      . minoxidil (LONITEN) 10 MG tablet Take 10 mg by mouth at bedtime.       . Multiple Vitamins-Minerals (PRESERVISION AREDS PO) Take 1 capsule by mouth 2 (two) times daily.      . nitroGLYCERIN (  NITROSTAT) 0.4 MG SL tablet Place 1 tablet (0.4 mg total) under the tongue every 5 (five) minutes x 3 doses as needed for chest pain.  25 tablet  3  . Potassium Chloride (KCL-20 PO) Take 20 mEq by mouth 3 (three) times daily.      . ranitidine (ZANTAC) 150 MG tablet Take 150 mg by mouth daily as needed for heartburn.       . sertraline (ZOLOFT) 100 MG tablet Take 100 mg by mouth daily.      . valsartan (DIOVAN) 320 MG tablet Take 320 mg by mouth daily.      [name] warfarin (COUMADIN) 5 MG tablet Take 5 mg by mouth daily at 6 PM. TAKE 5 MG S,M,W,F,S  AND  1/2 TAB 2.5  T,THURS..OR AS DIRECTED       No current facility-administered medications for this visit.    Physical Exam:  BP 114/72  Pulse 67  Resp 16  Ht 5\' 11"  (1.803 m)  Wt 208 lb (94.348 kg)  BMI 29.02 kg/m2  SpO2 96%  Gen: no apparent distress Heart: RRR Lungs: Diminished on Left Skin: right EVH site with 1-2 cm opening, some fatty necrosis vs. Old hematoma break down present, wound tracks 1-2 inches, minimally erythematous, no foul smell present Incision: Sternotomy well healed  Diagnostic Tests:  Moderate  Left sided pleural effusion, no significant change from 04/26/13   A/P:  1. Right EVH site with fatty necrosis and breakdown from old hematoma- no acute signs of infection, continue to hold ABX, continue daily wound care with packing 2. Left Moderate pleural effusion- would benefit from Thoracentesis, however on coumadin and will need to hold prior to proceeding  3. Bilateral LE edema- on Lasix 80 mg daily, will adjust regimen to 80 mg in the morning and 40 mg in the evening, recommend patient place some TED hose as well 4. RTC in 2 weeks, will reassess wound, repeat CXR and can schedule Thoracentesis at that time if no improvement in effusion

## 2013-05-06 ENCOUNTER — Other Ambulatory Visit: Payer: Self-pay | Admitting: Cardiovascular Disease

## 2013-05-08 ENCOUNTER — Ambulatory Visit
Admission: RE | Admit: 2013-05-08 | Discharge: 2013-05-08 | Disposition: A | Discharge status: Home / Self Care | Payer: Medicare Other | Source: Ambulatory Visit | Attending: Thoracic Surgery (Cardiothoracic Vascular Surgery) | Admitting: Thoracic Surgery (Cardiothoracic Vascular Surgery)

## 2013-05-08 ENCOUNTER — Other Ambulatory Visit: Payer: Self-pay | Admitting: *Deleted

## 2013-05-08 ENCOUNTER — Other Ambulatory Visit: Payer: Self-pay

## 2013-05-08 DIAGNOSIS — Z7901 Long term (current) use of anticoagulants: Secondary | ICD-10-CM

## 2013-05-08 DIAGNOSIS — J90 Pleural effusion, not elsewhere classified: Secondary | ICD-10-CM

## 2013-05-08 LAB — PROTIME-INR
INR: 1.78 — ABNORMAL HIGH (ref <1.50)
PROTHROMBIN TIME: 20.3 s — AB (ref 11.6–15.2)

## 2013-05-09 ENCOUNTER — Other Ambulatory Visit: Payer: Self-pay | Admitting: *Deleted

## 2013-05-09 DIAGNOSIS — J90 Pleural effusion, not elsewhere classified: Secondary | ICD-10-CM

## 2013-05-10 ENCOUNTER — Ambulatory Visit (HOSPITAL_COMMUNITY)
Admission: RE | Admit: 2013-05-10 | Discharge: 2013-05-10 | Disposition: A | Discharge status: Home / Self Care | Payer: Medicare Other | Source: Ambulatory Visit | Attending: Thoracic Surgery (Cardiothoracic Vascular Surgery) | Admitting: Thoracic Surgery (Cardiothoracic Vascular Surgery)

## 2013-05-10 ENCOUNTER — Ambulatory Visit (INDEPENDENT_AMBULATORY_CARE_PROVIDER_SITE_OTHER): Payer: Medicare Other | Admitting: Ophthalmology

## 2013-05-10 ENCOUNTER — Ambulatory Visit (HOSPITAL_COMMUNITY)
Admission: RE | Admit: 2013-05-10 | Discharge: 2013-05-10 | Disposition: A | Discharge status: Home / Self Care | Payer: Medicare Other | Source: Ambulatory Visit | Attending: Radiology | Admitting: Radiology

## 2013-05-10 DIAGNOSIS — H43819 Vitreous degeneration, unspecified eye: Secondary | ICD-10-CM

## 2013-05-10 DIAGNOSIS — H35039 Hypertensive retinopathy, unspecified eye: Secondary | ICD-10-CM

## 2013-05-10 DIAGNOSIS — J90 Pleural effusion, not elsewhere classified: Secondary | ICD-10-CM | POA: Insufficient documentation

## 2013-05-10 DIAGNOSIS — H348392 Tributary (branch) retinal vein occlusion, unspecified eye, stable: Secondary | ICD-10-CM

## 2013-05-10 DIAGNOSIS — I10 Essential (primary) hypertension: Secondary | ICD-10-CM

## 2013-05-10 DIAGNOSIS — H3530 Unspecified macular degeneration: Secondary | ICD-10-CM

## 2013-05-10 NOTE — Procedures (Signed)
US guided therapeutic left thoracentesis performed yielding 1.1 liters blood-tinged fluid. F/u CXR pending. No immediate complications.

## 2013-05-12 ENCOUNTER — Other Ambulatory Visit: Payer: Self-pay | Admitting: Thoracic Surgery (Cardiothoracic Vascular Surgery)

## 2013-05-12 DIAGNOSIS — I5033 Acute on chronic diastolic (congestive) heart failure: Secondary | ICD-10-CM

## 2013-05-15 ENCOUNTER — Ambulatory Visit
Admission: RE | Admit: 2013-05-15 | Discharge: 2013-05-15 | Disposition: A | Discharge status: Home / Self Care | Payer: Medicare Other | Source: Ambulatory Visit | Attending: Thoracic Surgery (Cardiothoracic Vascular Surgery) | Admitting: Thoracic Surgery (Cardiothoracic Vascular Surgery)

## 2013-05-15 ENCOUNTER — Encounter: Payer: Self-pay | Admitting: Thoracic Surgery (Cardiothoracic Vascular Surgery)

## 2013-05-15 ENCOUNTER — Ambulatory Visit (INDEPENDENT_AMBULATORY_CARE_PROVIDER_SITE_OTHER): Payer: Medicare Other | Admitting: Thoracic Surgery (Cardiothoracic Vascular Surgery)

## 2013-05-15 VITALS — BP 104/66 | HR 62 | Resp 20 | Ht 71.0 in | Wt 191.0 lb

## 2013-05-15 DIAGNOSIS — Z9889 Other specified postprocedural states: Principal | ICD-10-CM

## 2013-05-15 DIAGNOSIS — I5033 Acute on chronic diastolic (congestive) heart failure: Secondary | ICD-10-CM

## 2013-05-15 DIAGNOSIS — Z8679 Personal history of other diseases of the circulatory system: Secondary | ICD-10-CM

## 2013-05-15 DIAGNOSIS — J90 Pleural effusion, not elsewhere classified: Secondary | ICD-10-CM | POA: Insufficient documentation

## 2013-05-15 DIAGNOSIS — Z951 Presence of aortocoronary bypass graft: Secondary | ICD-10-CM

## 2013-05-15 NOTE — Patient Instructions (Signed)
The patient may return to driving an automobile as long as they are no longer requiring oral narcotic pain relievers during the daytime.  It would be wise to start driving only short distances during the daylight and gradually increase from there as they feel comfortable.  The patient is encouraged to enroll and participate in the outpatient cardiac rehab program beginning as soon as practical.  Decrease amiodarone to 200 mg daily

## 2013-05-15 NOTE — Progress Notes (Signed)
[address]       [place] [address]             [phone]     CARDIOTHORACIC SURGERY OFFICE NOTE  Referring Provider is Nahser, Wonda Cheng, MD PCP is Alesia Richards, MD   HPI:  Patient returns for followup status post coronary artery bypass grafting x2 and Maze procedure on 04/05/2013. His early postoperative course in the hospital was uncomplicated although he did go back into rate-controlled atrial fibrillation.  He later developed shortness of breath, orthopnea, and the left pleural effusion associated with acute exacerbation of chronic diastolic congestive heart failure. He has responded nicely to diuretic therapy, although he did undergo left thoracentesis for his pleural effusion last week yielding 1.2 L of fluid. Since then he has done quite well. He states that his breathing is much improved and his lower extremity edema has now nearly completely resolved. He has no significant pain. His appetite is good. His exercise tolerance is improving. He hopes to start the outpatient cardiac rehabilitation program.    Current Outpatient Prescriptions  Medication Sig Dispense Refill  . acetaminophen (TYLENOL) 500 MG tablet Take 1,000 mg by mouth every 6 (six) hours as needed for mild pain.       [name] ALPRAZolam (XANAX) 0.25 MG tablet Take 0.25-1 mg by mouth at bedtime as needed for sleep.       [name] amiodarone (PACERONE) 200 MG tablet Take 1 tablet (200 mg total) by mouth 2 (two) times daily.      [name] amLODipine (NORVASC) 10 MG tablet Take 10 mg by mouth 2 (two) times daily.       [name] aspirin EC 81 MG EC tablet Take 1 tablet (81 mg total) by mouth daily.      [name] atorvastatin (LIPITOR) 20 MG tablet Take 20 mg by mouth 3 (three) times a week. Monday Wednesday and Friday      . BESIVANCE 0.6 % SUSP Place 1 drop into the left eye 4 (four) times daily as needed (after injection). Use in left eye 4 times a day after each injection      . CLONIDINE HCL PO Take 0.2 mg by mouth  3 (three) times daily.       . diphenhydrAMINE (BENADRYL) 25 MG tablet Take 25 mg by mouth every 6 (six) hours as needed for allergies.       . furosemide (LASIX) 40 MG tablet Take 40 mg by mouth 3 (three) times daily.      [name] glimepiride (AMARYL) 2 MG tablet Take 2 mg by mouth daily before breakfast.        . HYDROcodone-acetaminophen (VICODIN) 5-500 MG per tablet Take 1 tablet by mouth every 6 (six) hours as needed for pain.      . metFORMIN (GLUCOPHAGE-XR) 500 MG 24 hr tablet TAKE 1 TO 2 TABLETS TWICE DAILY  360 tablet  4  . metoprolol tartrate (LOPRESSOR) 25 MG tablet Take 12.5 mg by mouth 2 (two) times daily.      . minoxidil (LONITEN) 10 MG tablet Take 10 mg by mouth at bedtime.       . Multiple Vitamins-Minerals (PRESERVISION AREDS PO) Take 1 capsule by mouth 2 (two) times daily.      . nitroGLYCERIN (NITROSTAT) 0.4 MG SL tablet Place 1 tablet (0.4 mg total) under the tongue every 5 (five) minutes x 3 doses as needed for chest pain.  25 tablet  3  . Potassium Chloride (  KCL-20 PO) Take 20 mEq by mouth 3 (three) times daily.      . ranitidine (ZANTAC) 150 MG tablet Take 150 mg by mouth daily as needed for heartburn.       . sertraline (ZOLOFT) 100 MG tablet Take 100 mg by mouth daily.      . valsartan (DIOVAN) 320 MG tablet TAKE 1 TABLET (320 MG TOTAL) BY MOUTH DAILY.  30 tablet  0  . warfarin (COUMADIN) 5 MG tablet Take 5 mg by mouth daily at 6 PM. TAKE 5 MG S,M,W,F,S  AND  1/2 TAB 2.5  T,THURS..OR AS DIRECTED       No current facility-administered medications for this visit.      Physical Exam:   BP 104/66  Pulse 62  Resp 20  Ht 5\' 11"  (1.803 m)  Wt 191 lb (86.637 kg)  BMI 26.65 kg/m2  SpO2 95%  General:  Well-appearing  Chest:   Clear to auscultation  CV:   Irregular rate and rhythm  Incisions:  Healing nicely, sternum is stable, scant drainage from saphenous vein harvest incision right thigh  Abdomen:  Soft nontender  Extremities:  Warm and well-perfused with trace lower  extremity edema  Diagnostic Tests:  CHEST 2 VIEW  COMPARISON: 05/10/2013  FINDINGS:  Enlargement of cardiac silhouette post CABG.  LEFT atrial appendage clip noted.  Pulmonary vascular congestion.  Mediastinal contour stable.  Bibasilar pleural effusions and atelectasis.  Underlying emphysematous changes suspected.  Upper lungs clear.  No definite pulmonary edema, segmental consolidation, or  pneumothorax.  Bones unremarkable.  IMPRESSION:  Enlargement of cardiac silhouette with pulmonary vascular congestion  post CABG.  Probable COPD changes with bibasilar pleural effusions and  atelectasis.  Electronically Signed  By: Lavonia Dana M.D.  On: 05/15/2013 13:47    Impression:  Patient is now progressing nicely following coronary artery bypass grafting x2 and Maze procedure on 04/05/2013. His pleural effusion is now almost completely gone and his lower extremity edema has resolved. He is probably approaching his baseline dry weight.  He might benefit from DC cardioversion if he remains in atrial fibrillation at the time of his followup appointment with Dr. Acie Fredrickson later this month.    Plan:  I've encouraged patient to continue to increase his physical activity as tolerated. In particular, I think he would benefit from getting started in the outpatient cardiac rehabilitation program. I've instructed him to decrease his dose of amiodarone to 200 mg daily. We otherwise have not made any changes to his current medications.  We would favor checking a 12 lead electrocardiogram at the time of this appointment with Dr. Acie Fredrickson later this month with consideration for possible DC cardioversion if he remains in atrial fibrillation at that time. The patient will return in 2 months for routine followup and rhythm check.   Valentina Gu. Roxy Manns, MD 05/15/2013 2:23 PM

## 2013-05-19 ENCOUNTER — Encounter: Payer: Self-pay | Admitting: Internal Medicine

## 2013-05-19 ENCOUNTER — Ambulatory Visit (INDEPENDENT_AMBULATORY_CARE_PROVIDER_SITE_OTHER): Payer: Medicare Other | Admitting: Internal Medicine

## 2013-05-19 VITALS — BP 108/60 | HR 66 | Temp 98.2°F | Resp 16 | Ht 71.0 in | Wt 191.0 lb

## 2013-05-19 DIAGNOSIS — I10 Essential (primary) hypertension: Secondary | ICD-10-CM

## 2013-05-19 DIAGNOSIS — E1129 Type 2 diabetes mellitus with other diabetic kidney complication: Secondary | ICD-10-CM

## 2013-05-19 DIAGNOSIS — E782 Mixed hyperlipidemia: Secondary | ICD-10-CM

## 2013-05-19 DIAGNOSIS — Z7901 Long term (current) use of anticoagulants: Secondary | ICD-10-CM

## 2013-05-19 DIAGNOSIS — Z79899 Other long term (current) drug therapy: Secondary | ICD-10-CM

## 2013-05-19 DIAGNOSIS — Z5181 Encounter for therapeutic drug level monitoring: Secondary | ICD-10-CM | POA: Insufficient documentation

## 2013-05-19 DIAGNOSIS — E559 Vitamin D deficiency, unspecified: Secondary | ICD-10-CM

## 2013-05-19 LAB — CBC WITH DIFFERENTIAL/PLATELET
BASOS PCT: 0 % (ref 0–1)
Basophils Absolute: 0 10*3/uL (ref 0.0–0.1)
EOS ABS: 0.2 10*3/uL (ref 0.0–0.7)
Eosinophils Relative: 2 % (ref 0–5)
HCT: 36.5 % — ABNORMAL LOW (ref 39.0–52.0)
HEMOGLOBIN: 12 g/dL — AB (ref 13.0–17.0)
Lymphocytes Relative: 9 % — ABNORMAL LOW (ref 12–46)
Lymphs Abs: 0.8 10*3/uL (ref 0.7–4.0)
MCH: 29.3 pg (ref 26.0–34.0)
MCHC: 32.9 g/dL (ref 30.0–36.0)
MCV: 89 fL (ref 78.0–100.0)
MONOS PCT: 7 % (ref 3–12)
Monocytes Absolute: 0.6 10*3/uL (ref 0.1–1.0)
NEUTROS PCT: 82 % — AB (ref 43–77)
Neutro Abs: 7.4 10*3/uL (ref 1.7–7.7)
Platelets: 325 10*3/uL (ref 150–400)
RBC: 4.1 MIL/uL — ABNORMAL LOW (ref 4.22–5.81)
RDW: 14.8 % (ref 11.5–15.5)
WBC: 9 10*3/uL (ref 4.0–10.5)

## 2013-05-19 LAB — HEMOGLOBIN A1C
HEMOGLOBIN A1C: 7 % — AB (ref <5.7)
MEAN PLASMA GLUCOSE: 154 mg/dL — AB (ref <117)

## 2013-05-19 NOTE — Progress Notes (Signed)
Subjective:    Patient ID: [name], male    DOB: Nov 16, 1940, 73 y.o.   MRN: [id number]  HPI Patient is a very nice 73 yo MWM now about 6 week s/p CABG x 2v and MAZE procedure (Apr 05, 2013 by Dr Roxy Manns)  who had recent 1(+) liter Left thoracentesis with improvement in dyspnea. Patient says he has now been released by Dr Roxy Manns to start in the cardiac rehab program. Patient remains for the time being on Coumadin and is here today for monitoring. He denies any problems with undue bleeding or bruising. Likewise he denies any exertional type CP, palpitations or PND/orthopnea. He does admit general fatigability which he appropriately attributes to deconditioning since his limited activity since surgery.    He reports his FBG's range 125-135 mg% since surgery and he's had no diabetic poly's, paresthesias, visual blurring or hypoglycemic reactions.    He is interested in trying to begin tapering of his BP meds and this was discussed    Medication Sig  . acetaminophen (TYLENOL) 500 MG tablet Take 1,000 mgevery 6 (six) hours as needed   . ALPRAZolam (XANAX) 0.25 MG tablet Take 0.25-1 mg at bedtime as needed  . amLODipine 10 MG tablet Take 10 mg  2  times daily.   [name] aspirin EC 81 MG EC tablet Take 1 tablet  daily.  [name] atorvastatin ( 20 MG tablet Take 20 mg  3  times a week. Mon Wed and Fri  . BESIVANCE 0.6 % SUSP Use in left eye 4 times a day after each injection  . CLONIDINE 0.2 mg Take 0.2 mg  3  times daily.   . diphenhydrAMINE  25 MG tablet Take 25 mg  every 6 (six) hours as needed for allergies.   . furosemide  40 MG  Take 40 mg  3 (three) times daily.  [name] glimepiride 2 MG  Take 2 mg daily before breakfast.    . VICODIN 5-500 MG  Take 1 tab every 6 (six) hours as needed for pain.  . metFORMIN-XR 500 MG 24 hr tab TAKE 1 TO 2 TABLETS TWICE DAILY  . metoprolol tartrate  25 MG tablet Take 12.5 mg  2  times daily.  . minoxidil  10 MG tablet Take 10 mg  at bedtime.   . nitroGLYCERIN  0.4 MG SL tablet  Place 1 tablet  as needed for chest pain.  [name] KCL-20  Take 20 mEq 3 ( times daily.  . ranitidine (ZANTAC) 150 MG  Take 150 mg  daily as needed  . sertraline (ZOLOFT) 100 MG Take 100 mgdaily.  . valsartan (DIOVAN) 320 MG tablet TAKE 1 TAB DAILY.      [name] warfarin (COUMADIN) 5 MG tablet Take 5 mg  daily at 6 PM. TAKE 5 MG S,M,W,F,S  AND  1/2 TAB 2.5  T,THURS.    Allergies  Allergen Reactions  . Horse-Derived Products Other (See Comments)    unknown  . Other Other (See Comments)    Tetanus Shot   (unknown reaction)  . Sunflower Seed [Sunflower Oil] Swelling   Past Medical History  Diagnosis Date  . Hx of adenomatous colonic polyps   . Diabetes mellitus type II   . Hypertension   . Diverticulosis 2001  . Hyperlipidemia   . Persistent atrial fibrillation     a. on amio and coumadin;  b. s/p MAZE 04/2013 in setting of CABG.  . Obstructive sleep apnea     compliant with CPAP  .  Chronic diastolic congestive heart failure   . Hypertensive cardiomyopathy   . History of cardioversion   . H/O hiatal hernia   . Cancer     Basal cell carcinoma  . Adrenal adenoma   . CAD (coronary artery disease)     a. 04/2013 CABG x 2: LIMA to LAD, SVG to RI, EVH via R thigh.  . S/P Maze operation for atrial fibrillation     a. 04/2013: Complete bilateral atrial lesion set using cryothermy and bipolar radiofrequency ablation with clipping of LA appendage (@ time of CABG)  . Type II or unspecified type diabetes mellitus without mention of complication, not stated as uncontrolled   . GERD (gastroesophageal reflux disease)   . Depression   . Anxiety   . DJD (degenerative joint disease)    Past Surgical History  Procedure Laterality Date  . Polinydal cyst      Removed  . Great toe arthrodesis, interphalangeal joint      Right foot  . Retina repair-right    . Cataract extraction      bilateral  . Basal cell carcinoma excision      x3 on face  . Polypectomy    . Cardiac catheterization       myocardial bridge but no cad  . Coronary artery bypass graft N/A 04/05/2013    Procedure: CORONARY ARTERY BYPASS GRAFTING (CABG) TIMES TWO USING LEFT INTERNAL MAMMARY ARTERY AND RIGHT SAPHENOUS LEG VEIN HARVESTED ENDOSCOPICALLY;  Surgeon: Rexene Alberts, MD;  Location: [facility];  Service: Open Heart Surgery;  Laterality: N/A;  . Maze N/A 04/05/2013    Procedure: MAZE;  Surgeon: Rexene Alberts, MD;  Location: [facility];  Service: Open Heart Surgery;  Laterality: N/A;  . Intraoperative transesophageal echocardiogram N/A 04/05/2013    Procedure: INTRAOPERATIVE TRANSESOPHAGEAL ECHOCARDIOGRAM;  Surgeon: Rexene Alberts, MD;  Location: [facility];  Service: Open Heart Surgery;  Laterality: N/A;   Review of Systems In addition to the HPI above,  No Fever-chills,  No Headache, No changes with Vision or hearing,  No problems swallowing food or Liquids,  No Chest pain or productive Cough or Shortness of Breath,  No Abdominal pain, No Nausea or Vommitting, Bowel movements are regular,  No Blood in stool or Urine,  No dysuria,  No new skin rashes or bruises,  No new joints pains-aches,  No new weakness, tingling, numbness in any extremity,  No recent weight loss,  No polyuria, polydypsia or polyphagia,  No significant Mental Stressors.  A full 10 point Review of Systems was done, except as stated above, all other Review of Systems were negative  Objective:   Physical Exam  BP 108/60  Pulse 66  Temp98.2 F   Resp 16  Ht 5\' 11"    Wt 191 lb   BMI 26.65 kg/m2  HEENT - Eac's patent. TM's Nl.EOM's full. PERRLA. NasoOroPharynx clear. Neck - supple. Nl Thyroid. No bruits nodes JVD Chest - Clear equal BS Cor - Nl HS. Regular Rate & Rhythm w/o sig MGR. PP 1(+) No edema. Abd - No palpable organomegaly, masses or tenderness. BS nl. MS- FROM. w/o deformities. Muscle power tone and bulk Nl. Gait Nl. Neuro - No obvious Cr N abnormalities. Sensory, motor and Cerebellar functions appear Nl w/o focal  abnormalities.  Assessment & Plan:   1. Hypertension - TSH  2. Hyperlipidemia - Lipid panel  3. 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 Vitamin D Deficiency -  Vit D  25 hydroxy (rtn osteoporosis monitoring)  5. Encounter for long-term (current) use of other medications - CBC with Differential - BASIC METABOLIC PANEL WITH GFR - Hepatic function panel - Magnesium  6. Long term (current) use of anticoagulants - Protime-INR  Discussed with patient (because of sedation & dry mouth)  to begin taper of Clonidine 0.2 mg to 1/2 tab tid for 1-2 weeks and then to 1/2 tab bid for 1-2 weeks,  then d/c if BP's stay down.  Then if BP's stay down (because of dependent edema)  to next try to decrease Amlodipine to 1/2 bid for 2 weeks and the 1/2 qd for 2 weeks and stop.  Then in aperfect world if BP stays down - may next be able to taper his minoxidil.

## 2013-05-19 NOTE — Patient Instructions (Signed)
Warfarin: What You Need to Know Warfarin is an anticoagulant. Anticoagulants help prevent the formation of blood clots. They also help stop the growth of blood clots. Warfarin is sometimes referred to as a "blood thinner."  Normally, when body tissues are cut or damaged, the blood clots in order to prevent blood loss. Sometimes clots form inside your blood vessels and obstruct the flow of blood through your circulatory system (thrombosis). These clots may travel through your bloodstream and become lodged in smaller blood vessels in your brain, which can cause a stroke, or your lungs (pulmonary embolism). WHO SHOULD USE WARFARIN? Warfarin is prescribed for people at risk of developing harmful blood clots:  People with surgically implanted mechanical heart valves, irregular heart rhythms called atrial fibrillation, and certain clotting disorders.  People who have developed harmful blood clotting in the past, including those who have had a stroke or a pulmonary embolism, or thrombosis in their legs (deep vein thrombosis [DVT]).  People with an existing blood clot such as a pulmonary embolism. WARFARIN DOSING Warfarin tablets come in different strengths. Each tablet strength is a different color, with the amount of warfarin (in milligrams) clearly printed on the tablet. If the color of your tablet is different than usual when you receive a new prescription, report it immediately to your pharmacist or health care provider. WARFARIN MONITORING The goal of warfarin therapy is to lessen the clotting tendency of blood but not to prevent clotting completely. Your health care provider will monitor the anticoagulation effect of warfarin closely and adjust your dose as needed. For your safety, blood tests called prothrombin time (PT) or international normalized ratio (INR) are used to measure the effects of warfarin. Both of these tests can be done with a finger stick or a blood draw. The longer it takes the blood  to clot, the higher the PT or INR. Your health care provider will inform you of your "target" PT or INR range. If, at any time, your PT or INR is above the target range, there is a risk of bleeding. If your PT or INR is below the target range, there is a risk of clotting. Whether you are started on warfarin while you are in the hospital, or in your health care provider's office, you will need to have your PT or INR checked within one week of starting the medicine. Initially, some people are asked to have their PT or INR checked as much as twice a week. Once you are on a stable maintenance dose, the PT or INR is checked less often, usually once every 2 to 4 weeks. The warfarin dose may be adjusted if the PT or INR is not within the target range. It is important to keep all laboratory and health care provider follow-up appointments.  WHAT ARE THE SIDE EFFECTS OF WARFARIN?  Too much warfarin can cause bleeding (hemorrhage) from any part of the body. This may include bleeding from the gums, blood in the urine, bloody or dark stools, a nosebleed that is not easily stopped, coughing up blood, or vomiting blood.  Too little warfarin can increase the risk of blood clots.  Too little or too much warfarin can also increase the risk of a stroke.  Warfarin use may cause a skin rash or irritation, an unusual fever, continual nausea or stomach upset, or severe pain in your joints or back. SPECIAL PRECAUTIONS WHILE TAKING WARFARIN Warfarin should be taken exactly as directed:  Take your medicine at the same time every day.   If you forget to take your dose, you can take it if it is within 6 hours of when it was due.  Do not change the dose of warfarin on your own to make up for missed or extra doses.  If you miss more than 2 doses in a row, you should contact your health care provider for advice. Avoid situations that cause bleeding. You may have a tendency to bleed more easily than usual while taking warfarin.  The following actions can limit bleeding:  Using a softer toothbrush.  Flossing with waxed floss rather than unwaxed floss.  Shaving with an electric razor rather than a blade.  Limiting the use of sharp objects.  Avoiding potentially harmful activities such as contact sports. Warfarin and Pregnancy or Breastfeeding  Warfarin is not advised during the first trimester of pregnancy due to an increased risk of birth defects. In certain situations, a woman may take warfarin after her first trimester of pregnancy. A woman who becomes pregnant or plans to become pregnant while taking warfarin should notify her health care provider immediately.  Although warfarin does not pass into breast milk, a woman who wishes to breastfeed while taking warfarin should also consult with her health care provider. Alcohol, Smoking, and Illicit Drug Use  Alcohol affects how warfarin works in the body. It is best to avoid alcoholic drinks or consume very small amounts while taking warfarin. In general, alcohol intake should be limited to 1 oz (30 mL) of liquor, 6 oz (180 mL) of wine, or 12 oz (360 mL) of beer each day. Notify your health care provider if you change your alcohol intake.  Smoking affects how warfarin works. It is best to avoid smoking while taking warfarin. Notify your health care provider if you change your smoking habits.  It is best to avoid all illicit drugs while taking warfarin since there are few studies that show how warfarin interacts with these drugs. Other Medicines and Dietary Supplements Many prescription and over-the-counter medicines can interfere with warfarin. Be sure all of your health care providers know you are taking warfarin. Notify your health care provider who prescribed warfarin for you before starting or stopping any new medicines, including over-the-counter vitamins, dietary supplements, and pain medicines. Your warfarin dose may need to be adjusted. Some common  over-the-counter medicines that may increase the risk of bleeding while taking warfarin include:   Acetaminophen.  Aspirin.  Nonsteroidal anti-inflammatory medicines such as ibuprofen or naproxen.  Vitamin E. Dietary Considerations  Foods that have moderate or high amounts of vitamin K can interfere with warfarin. Avoid major changes in your diet or notify your health care provider before changing your diet. Eat a consistent amount of foods that have moderate or high amounts of vitamin K.Eating less foods containing vitamin K can increase the risk of bleeding. Eating more foods containing vitamin K can increase the risk of blood clots. Additional questions about dietary considerations can be discussed with a dietitian. The serving size for foods containing moderate or high amounts of vitamin K are  cup cooked (120 mL or noted gram weight) or 1 cup raw (240 mL or noted gram weight), unless otherwise noted. These foods include: Proteins  Beef liver, 3.5 oz (100 g).  Pork liver, 3.5 oz (100 g). Legumes  Soybean oil.  Soybeans.  Garbanzo beans.  Green peas.  Black-eyed peas. Leafy green vegetables  Kale.  Spinach.  Nettle greens.  Swiss chard.  Watercress.  Endive.  Parsley, 1 tbsp (4 g).    Turnip greens.  Collard greens.  Seaweed, limit 2 sheets.  Beet greens.  Dandelion greens.  Mustard greens.  Green Lead and Romaine lettuce. Cruciferous vegetables  Broccoli.  Cabbage (green or Chinese).  Brussels sprouts.  Cauliflower.  Asparagus. Miscellaneous  Onions, green onions, or spring onions.  Green tea made with  oz (14 g) or more of dried tea.  Herbal teas containing coumarin.  Spinach noodles.  Okra.  Prunes.  Pickles. CALL YOUR CLINIC OR HEALTH CARE PROVIDER IF YOU:  Plan to have any surgery or procedure.  Feel sick, especially if you have diarrhea or vomiting.  Experience or anticipate any major changes in your diet.  Start or  stop a prescription or over-the-counter medicine.  Become, plan to become, or think you may be pregnant.  Are having heavier than usual menstrual periods.  Have had a fall, accident, or any symptoms of bleeding or unusual bruising.  An unusual fever. CALL 911 IN THE [place] OR GO TO THE EMERGENCY DEPARTMENT IF YOU:   Think you may be having an allergic reaction to warfarin. The signs of an allergic reaction could include itching, rash, hives, swelling, chest tightness, or trouble breathing.  See signs of blood in your urine. The signs could include reddish, pinkish, or tea-colored urine.  See signs of blood in your stools. The signs could include bright red or black stools.  Vomit or cough up blood. In these instances, the blood could have either a bright red or a "coffee-grounds" appearance.  Have bleeding that will not stop after applying pressure for 30 minutes such as cuts, nosebleeds, other injuries.  Have severe pain in your joints or back.  Have a new and severe headache.  Have sudden weakness or numbness of your face, arm, or leg, especially on one side of your body.  Have sudden confusion or trouble understanding.  Have sudden trouble seeing in one or both eyes.  Have sudden trouble walking, dizziness, loss of balance, or coordination.  Have aphasia. Document Released: 12/22/2004 Document Revised: 09/16/2011 Document Reviewed: 06/17/2012 [employer] Patient Information 2014 [employer]   Hypertension As your heart beats, it forces blood through your arteries. This force is your blood pressure. If the pressure is too high, it is called hypertension (HTN) or high blood pressure. HTN is dangerous because you may have it and not know it. High blood pressure may mean that your heart has to work harder to pump blood. Your arteries may be narrow or stiff. The extra work puts you at risk for heart disease, stroke, and other problems.  Blood pressure consists of two numbers, a  higher number over a lower, 110/72, for example. It is stated as "110 over 72." The ideal is below 120 for the top number (systolic) and under 80 for the bottom (diastolic). Write down your blood pressure today. You should pay close attention to your blood pressure if you have certain conditions such as:  Heart failure.  Prior heart attack.  Diabetes  Chronic kidney disease.  Prior stroke.  Multiple risk factors for heart disease. To see if you have HTN, your blood pressure should be measured while you are seated with your arm held at the level of the heart. It should be measured at least twice. A one-time elevated blood pressure reading (especially in the Emergency Department) does not mean that you need treatment. There may be conditions in which the blood pressure is different between your right and left arms. It is important to see your   caregiver soon for a recheck. Most people have essential hypertension which means that there is not a specific cause. This type of high blood pressure may be lowered by changing lifestyle factors such as:  Stress.  Smoking.  Lack of exercise.  Excessive weight.  Drug/tobacco/alcohol use.  Eating less salt. Most people do not have symptoms from high blood pressure until it has caused damage to the body. Effective treatment can often prevent, delay or reduce that damage. TREATMENT  When a cause has been identified, treatment for high blood pressure is directed at the cause. There are a large number of medications to treat HTN. These fall into several categories, and your caregiver will help you select the medicines that are best for you. Medications may have side effects. You should review side effects with your caregiver. If your blood pressure stays high after you have made lifestyle changes or started on medicines,   Your medication(s) may need to be changed.  Other problems may need to be addressed.  Be certain you understand your  prescriptions, and know how and when to take your medicine.  Be sure to follow up with your caregiver within the time frame advised (usually within two weeks) to have your blood pressure rechecked and to review your medications.  If you are taking more than one medicine to lower your blood pressure, make sure you know how and at what times they should be taken. Taking two medicines at the same time can result in blood pressure that is too low. SEEK IMMEDIATE MEDICAL CARE IF:  You develop a severe headache, blurred or changing vision, or confusion.  You have unusual weakness or numbness, or a faint feeling.  You have severe chest or abdominal pain, vomiting, or breathing problems. MAKE SURE YOU:   Understand these instructions.  Will watch your condition.  Will get help right away if you are not doing well or get worse.   Diabetes and Exercise Exercising regularly is important. It is not just about losing weight. It has many health benefits, such as:  Improving your overall fitness, flexibility, and endurance.  Increasing your bone density.  Helping with weight control.  Decreasing your body fat.  Increasing your muscle strength.  Reducing stress and tension.  Improving your overall health. People with diabetes who exercise gain additional benefits because exercise:  Reduces appetite.  Improves the body's use of blood sugar (glucose).  Helps lower or control blood glucose.  Decreases blood pressure.  Helps control blood lipids (such as cholesterol and triglycerides).  Improves the body's use of the hormone insulin by:  Increasing the body's insulin sensitivity.  Reducing the body's insulin needs.  Decreases the risk for heart disease because exercising:  Lowers cholesterol and triglycerides levels.  Increases the levels of good cholesterol (such as high-density lipoproteins [HDL]) in the body.  Lowers blood glucose levels. YOUR ACTIVITY PLAN  Choose an  activity that you enjoy and set realistic goals. Your health care provider or diabetes educator can help you make an activity plan that works for you. You can break activities into 2 or 3 sessions throughout the day. Doing so is as good as one long session. Exercise ideas include:  Taking the dog for a walk.  Taking the stairs instead of the elevator.  Dancing to your favorite song.  Doing your favorite exercise with a friend. RECOMMENDATIONS FOR EXERCISING WITH TYPE 1 OR TYPE 2 DIABETES   Check your blood glucose before exercising. If blood glucose levels are   greater than 240 mg/dL, check for urine ketones. Do not exercise if ketones are present.  Avoid injecting insulin into areas of the body that are going to be exercised. For example, avoid injecting insulin into:  The arms when playing tennis.  The legs when jogging.  Keep a record of:  Food intake before and after you exercise.  Expected peak times of insulin action.  Blood glucose levels before and after you exercise.  The type and amount of exercise you have done.  Review your records with your health care provider. Your health care provider will help you to develop guidelines for adjusting food intake and insulin amounts before and after exercising.  If you take insulin or oral hypoglycemic agents, watch for signs and symptoms of hypoglycemia. They include:  Dizziness.  Shaking.  Sweating.  Chills.  Confusion.  Drink plenty of water while you exercise to prevent dehydration or heat stroke. Body water is lost during exercise and must be replaced.  Talk to your health care provider before starting an exercise program to make sure it is safe for you. Remember, almost any type of activity is better than none.    Cholesterol Cholesterol is a white, waxy, fat-like protein needed by your body in small amounts. The liver makes all the cholesterol you need. It is carried from the liver by the blood through the blood  vessels. Deposits (plaque) may build up on blood vessel walls. This makes the arteries narrower and stiffer. Plaque increases the risk for heart attack and stroke. You cannot feel your cholesterol level even if it is very high. The only way to know is by a blood test to check your lipid (fats) levels. Once you know your cholesterol levels, you should keep a record of the test results. Work with your caregiver to to keep your levels in the desired range. WHAT THE RESULTS MEAN:  Total cholesterol is a rough measure of all the cholesterol in your blood.  LDL is the so-called bad cholesterol. This is the type that deposits cholesterol in the walls of the arteries. You want this level to be low.  HDL is the good cholesterol because it cleans the arteries and carries the LDL away. You want this level to be high.  Triglycerides are fat that the body can either burn for energy or store. High levels are closely linked to heart disease. DESIRED LEVELS:  Total cholesterol below 200.  LDL below 100 for people at risk, below 70 for very high risk.  HDL above 50 is good, above 60 is best.  Triglycerides below 150. HOW TO LOWER YOUR CHOLESTEROL:  Diet.  Choose fish or white meat chicken and turkey, roasted or baked. Limit fatty cuts of red meat, fried foods, and processed meats, such as sausage and lunch meat.  Eat lots of fresh fruits and vegetables. Choose whole grains, beans, pasta, potatoes and cereals.  Use only small amounts of olive, corn or canola oils. Avoid butter, mayonnaise, shortening or palm kernel oils. Avoid foods with trans-fats.  Use skim/nonfat milk and low-fat/nonfat yogurt and cheeses. Avoid whole milk, cream, ice cream, egg yolks and cheeses. Healthy desserts include angel food cake, ginger snaps, animal crackers, hard candy, popsicles, and low-fat/nonfat frozen yogurt. Avoid pastries, cakes, pies and cookies.  Exercise.  A regular program helps decrease LDL and raises  HDL.  Helps with weight control.  Do things that increase your activity level like gardening, walking, or taking the stairs.  Medication.  May be prescribed   by your caregiver to help lowering cholesterol and the risk for heart disease.  You may need medicine even if your levels are normal if you have several risk factors. HOME CARE INSTRUCTIONS   Follow your diet and exercise programs as suggested by your caregiver.  Take medications as directed.  Have blood work done when your caregiver feels it is necessary. MAKE SURE YOU:   Understand these instructions.  Will watch your condition.  Will get help right away if you are not doing well or get worse.      Vitamin D Deficiency Vitamin D is an important vitamin that your body needs. Having too little of it in your body is called a deficiency. A very bad deficiency can make your bones soft and can cause a condition called rickets.  Vitamin D is important to your body for different reasons, such as:   It helps your body absorb 2 minerals called calcium and phosphorus.  It helps make your bones healthy.  It may prevent some diseases, such as diabetes and multiple sclerosis.  It helps your muscles and heart. You can get vitamin D in several ways. It is a natural part of some foods. The vitamin is also added to some dairy products and cereals. Some people take vitamin D supplements. Also, your body makes vitamin D when you are in the sun. It changes the sun's rays into a form of the vitamin that your body can use. CAUSES   Not eating enough foods that contain vitamin D.  Not getting enough sunlight.  Having certain digestive system diseases that make it hard to absorb vitamin D. These diseases include Crohn's disease, chronic pancreatitis, and cystic fibrosis.  Having a surgery in which part of the stomach or small intestine is removed.  Being obese. Fat cells pull vitamin D out of your blood. That means that obese people  may not have enough vitamin D left in their blood and in other body tissues.  Having chronic kidney or liver disease. RISK FACTORS Risk factors are things that make you more likely to develop a vitamin D deficiency. They include:  Being older.  Not being able to get outside very much.  Living in a nursing home.  Having had broken bones.  Having weak or thin bones (osteoporosis).  Having a disease or condition that changes how your body absorbs vitamin D.  Having dark skin.  Some medicines such as seizure medicines or steroids.  Being overweight or obese. SYMPTOMS Mild cases of vitamin D deficiency may not have any symptoms. If you have a very bad case, symptoms may include:  Bone pain.  Muscle pain.  Falling often.  Broken bones caused by a minor injury, due to osteoporosis. DIAGNOSIS A blood test is the best way to tell if you have a vitamin D deficiency. TREATMENT Vitamin D deficiency can be treated in different ways. Treatment for vitamin D deficiency depends on what is causing it. Options include:  Taking vitamin D supplements.  Taking a calcium supplement. Your caregiver will suggest what dose is best for you. HOME CARE INSTRUCTIONS  Take any supplements that your caregiver prescribes. Follow the directions carefully. Take only the suggested amount.  Have your blood tested 2 months after you start taking supplements.  Eat foods that contain vitamin D. Healthy choices include:  Fortified dairy products, cereals, or juices. Fortified means vitamin D has been added to the food. Check the label on the package to be sure.  Fatty fish like   salmon or trout.  Eggs.  Oysters.  Do not use a tanning bed.  Keep your weight at a healthy level. Lose weight if you need to.  Keep all follow-up appointments. Your caregiver will need to perform blood tests to make sure your vitamin D deficiency is going away. SEEK MEDICAL CARE IF:  You have any questions about your  treatment.  You continue to have symptoms of vitamin D deficiency.  You have nausea or vomiting.  You are constipated.  You feel confused.  You have severe abdominal or back pain. MAKE SURE YOU:  Understand these instructions.  Will watch your condition.  Will get help right away if you are not doing well or get worse.   

## 2013-05-20 ENCOUNTER — Other Ambulatory Visit: Payer: Self-pay | Admitting: Internal Medicine

## 2013-05-20 LAB — TSH: TSH: 2.049 u[IU]/mL (ref 0.350–4.500)

## 2013-05-20 LAB — LIPID PANEL
Cholesterol: 112 mg/dL (ref 0–200)
HDL: 31 mg/dL — ABNORMAL LOW (ref >39)
LDL Cholesterol: 61 mg/dL (ref 0–99)
Total CHOL/HDL Ratio: 3.6 Ratio
Triglycerides: 102 mg/dL (ref <150)
VLDL: 20 mg/dL (ref 0–40)

## 2013-05-20 LAB — BASIC METABOLIC PANEL WITH GFR
BUN: 20 mg/dL (ref 6–23)
CALCIUM: 9.4 mg/dL (ref 8.4–10.5)
CO2: 27 mEq/L (ref 19–32)
CREATININE: 1.13 mg/dL (ref 0.50–1.35)
Chloride: 102 mEq/L (ref 96–112)
GFR, EST AFRICAN AMERICAN: 74 mL/min
GFR, Est Non African American: 64 mL/min
GLUCOSE: 239 mg/dL — AB (ref 70–99)
Potassium: 3.9 mEq/L (ref 3.5–5.3)
SODIUM: 139 meq/L (ref 135–145)

## 2013-05-20 LAB — HEPATIC FUNCTION PANEL
ALT: 16 U/L (ref 0–53)
AST: 16 U/L (ref 0–37)
Albumin: 4.2 g/dL (ref 3.5–5.2)
Alkaline Phosphatase: 141 U/L — ABNORMAL HIGH (ref 39–117)
BILIRUBIN DIRECT: 0.1 mg/dL (ref 0.0–0.3)
Indirect Bilirubin: 0.6 mg/dL (ref 0.2–1.2)
TOTAL PROTEIN: 7.2 g/dL (ref 6.0–8.3)
Total Bilirubin: 0.7 mg/dL (ref 0.2–1.2)

## 2013-05-20 LAB — INSULIN, FASTING: Insulin fasting, serum: 12 u[IU]/mL (ref 3–28)

## 2013-05-20 LAB — MAGNESIUM: MAGNESIUM: 1.9 mg/dL (ref 1.5–2.5)

## 2013-05-20 LAB — PROTIME-INR
INR: 1.67 — ABNORMAL HIGH (ref <1.50)
PROTHROMBIN TIME: 19.4 s — AB (ref 11.6–15.2)

## 2013-05-20 LAB — VITAMIN D 25 HYDROXY (VIT D DEFICIENCY, FRACTURES): VIT D 25 HYDROXY: 55 ng/mL (ref 30–89)

## 2013-05-25 ENCOUNTER — Ambulatory Visit
Admission: RE | Admit: 2013-05-25 | Discharge: 2013-05-25 | Disposition: A | Discharge status: Home / Self Care | Payer: Medicare Other | Source: Ambulatory Visit | Attending: Cardiothoracic Surgery | Admitting: Cardiothoracic Surgery

## 2013-05-25 ENCOUNTER — Telehealth (HOSPITAL_COMMUNITY): Payer: Self-pay | Admitting: *Deleted

## 2013-05-25 ENCOUNTER — Encounter (HOSPITAL_COMMUNITY)
Admission: RE | Admit: 2013-05-25 | Discharge: 2013-05-25 | Disposition: A | Discharge status: Home / Self Care | Payer: Medicare Other | Source: Ambulatory Visit | Attending: Cardiovascular Disease | Admitting: Cardiovascular Disease

## 2013-05-25 ENCOUNTER — Other Ambulatory Visit: Payer: Self-pay | Admitting: *Deleted

## 2013-05-25 ENCOUNTER — Encounter: Payer: Self-pay | Admitting: *Deleted

## 2013-05-25 ENCOUNTER — Telehealth: Payer: Self-pay | Admitting: *Deleted

## 2013-05-25 DIAGNOSIS — I5032 Chronic diastolic (congestive) heart failure: Secondary | ICD-10-CM | POA: Insufficient documentation

## 2013-05-25 DIAGNOSIS — Z5189 Encounter for other specified aftercare: Secondary | ICD-10-CM | POA: Insufficient documentation

## 2013-05-25 DIAGNOSIS — I110 Hypertensive heart disease with heart failure: Secondary | ICD-10-CM | POA: Insufficient documentation

## 2013-05-25 DIAGNOSIS — Z8709 Personal history of other diseases of the respiratory system: Secondary | ICD-10-CM

## 2013-05-25 DIAGNOSIS — I509 Heart failure, unspecified: Secondary | ICD-10-CM

## 2013-05-25 DIAGNOSIS — E782 Mixed hyperlipidemia: Secondary | ICD-10-CM | POA: Insufficient documentation

## 2013-05-25 DIAGNOSIS — Z951 Presence of aortocoronary bypass graft: Secondary | ICD-10-CM | POA: Insufficient documentation

## 2013-05-25 DIAGNOSIS — I4891 Unspecified atrial fibrillation: Secondary | ICD-10-CM | POA: Insufficient documentation

## 2013-05-25 NOTE — Telephone Encounter (Signed)
Carlette, R.N. Called from Cardiac Rehab at [facility] with concerns related to [name] pulse ox readings at orientation. It was as low as 87% on arrival, but with rest increased to 90%. All readings were on room air.  Breath sounds were good except diminished in the left base.  Dr. 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 He is on daily Lasix and K.  I  suggested a chest xray at GI after  orientation and have him come to our office for the reading. She agreed.

## 2013-05-25 NOTE — Telephone Encounter (Signed)
Message copied by Rowe Pavy on Thu May 25, 2013 12:44 PM ------      Message from: Darylene Price MD H      Created: Thu May 25, 2013 12:22 PM      Regarding: RE: low o2 sat today at cardiac rehab orientation       No restrictions other than sternal precautions            ----- Message -----         From: Rowe Pavy, RN         Sent: 05/25/2013  11:59 AM           To: Rexene Alberts, MD      Subject: low o2 sat today at cardiac rehab orientation             Pt in today for orientation(no exercise) for cardiac rehab.  Pt with noted low o2 sat 88-90% on room air.  Able to increase O2 to 94-95% with deep breathing.  Lung sounds clear on right diminished on left lower lobe, denies sob.  Due to recent thoracentesis earlier this month, I called and spoke to [employer].  Pt instructed to come over for chest x ray and then to office for plan of care.  Pt sent over to office as directed.  Due to no none pulmonary disease, encouraged and instructed on good pulmonary toilet - incentive spirometer, deep breath.  Pt demonstrated appropriately and had a nice/quick response to O2 saturation increased to 95%.  Subsequent spontaneous checks O2 sat about 90% at rest.            Due to the holiday, pt will start exercise on Wednesday. We will monitor his O2 during exercise            Any restrictions for exercise?            Thanks      [employer] RN       ------

## 2013-05-25 NOTE — Progress Notes (Unsigned)
Patient ID: [name], male   DOB: 04/05/1940, 73 y.o.   MRN: [id number] Mr. Diebel had his chest xray wihich was stable and showed no progression of pleural effusions.  He was breathing easily and had minimal edema of his lower extremities. Dr. Servando Snare was made aware of the chest xray results. He will be seen as scheduled in July.

## 2013-05-25 NOTE — Telephone Encounter (Signed)
Pt in today for orientation ( no exercise).  Pt O2 sat upon arrival was 87%.  Pt was able to get his O2 sat up to 90% with deep breathing.  Subsequent spontaneous checks revealed O2 sat 90%.  Encouraged PLB and deep breathing able to get O2 sat up to 95% but subsequent O2 sat checks showed 90%.   Pt remarked that he had a thoracentesis the first of May.  Lung sounds clear on right but diminished on the left lower base.  Marlana Latus - Dr. Roxy Manns called and message left on voicemail.  Requested call back to cardiac rehab for plan of care.

## 2013-05-25 NOTE — Progress Notes (Signed)
Cardiac Rehab Medication Review by a Pharmacist  Does the patient  feel that his/her medications are working for him/her?  yes  Has the patient been experiencing any side effects to the medications prescribed?  yes  Does the patient measure his/her own blood pressure or blood glucose at home?  yes   Does the patient have any problems obtaining medications due to transportation or finances?   no  Understanding of regimen: good Understanding of indications: good Potential of compliance: good    Pharmacist comments:  Pleasant 73 yo gentleman with great understanding of his medication regimen. He states that he uses a pillbox to remember to take his medications and rarely misses any doses of his medications. He admits to having some weakness/lethargy after using clonidine for which the dose was recently decreased and he does not take it prior to driving. Otherwise, no medication issues noted.    [name] 05/25/2013 8:19 AM

## 2013-05-26 ENCOUNTER — Ambulatory Visit (INDEPENDENT_AMBULATORY_CARE_PROVIDER_SITE_OTHER): Payer: Medicare Other | Admitting: Cardiovascular Disease

## 2013-05-26 ENCOUNTER — Encounter: Payer: Self-pay | Admitting: Cardiovascular Disease

## 2013-05-26 VITALS — BP 116/69 | HR 67 | Ht 71.0 in | Wt 185.5 lb

## 2013-05-26 DIAGNOSIS — I4891 Unspecified atrial fibrillation: Secondary | ICD-10-CM

## 2013-05-26 DIAGNOSIS — I509 Heart failure, unspecified: Secondary | ICD-10-CM

## 2013-05-26 DIAGNOSIS — I5032 Chronic diastolic (congestive) heart failure: Secondary | ICD-10-CM

## 2013-05-26 DIAGNOSIS — Z951 Presence of aortocoronary bypass graft: Secondary | ICD-10-CM

## 2013-05-26 NOTE — Progress Notes (Signed)
[name] Date of Birth  1940/05/27 [facility] [address]    [place], [place]  [address] [phone]  Fax  [phone]  Problem list: 1. Hypertension 2. HOCM 3. Aortic insuffuciency 4. Atrial fibrillation-failed Tikosyn,   Has seen [facility] in [place], [place] .   5. Diabetes mellitus 6. 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 He has a hyperdynamic LV with systolic anterior motion of the mitral valve leaflet.  History of Present Illness:  [name] is a 73 year old gentleman with a history of hypertension, atrial fibrillation and a myocardial bridge.  He is a previous patient of Dr. Susa Simmonds. He also has a history of diabetes. He has felt fairly well. He has not had any episodes of chest pain or shortness of breath.    Nov. 25, 2013: He is very symptomatic with his Atrial fib.  He gets very winded with walking.  He has been on Eliquis and tikosyn 500 BID.  He had an episode of severe dyspnea while at [place] farm in [place].  He was diagnosed with panic attack.  Enzymes were normal.  He is ready for his cardioversion.  Nov. 27, 2013: [name] was seen 2 days ago and was found to have atrial fibrillation. He was scheduled for cardioversion. When he showed up for cardioversion yesterday, he was in normal sinus rhythm.  He related to me that he had some medication changes over the past several weeks. He was previously on Tikosyn and citalopram and his atrial fibrillation seemed to be well-controlled on that.  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  It was at that time that he started having increased palpitations, anxiety, and we noted him to be in atrial fibrillation. He then discontinued the Wellbutrin and was started on Zoloft.  He seems to be tolerating this combination. It was at that time that he showed up in the short stay Center and was found to  have already converted to sinus rhythm.  May 31, 2012:  [name] is doing well.  Still in NSR.  No leg edema.  He sticks to a low sodium diet.   He has occasional CP - typically associated with arm / chest movement.   He wants to stop coumadin - interferes with his arthritis medications.  He has maintained NSR since his last visit.    Nov. 24, 2014:  [name] is doing OK.  He has some brief palps - he thinks they are PVCs.  No sustained episodes of Afib.   He has been getting Avastin injections into his eyes.    Jan. 12, 2015:  [name] is seen today as a work in visit for worsening ankle edema.   He has not been able to associate his symptoms with change of diet.  .  he has also been noticing some increasing shortness of breath.  He notes short or shortness of breath with any activity-example take the garbage cans out.   He denies any PND. He uses a CPAP mask.    Jan. 26, 2015:  [name] was seen 2 weeks ago with symptoms consistent with congestive heart failure. He had been in persistent atrial fibrillation and so his tikosyn  was stopped.  He still atrial fibrillation. His rate is well controlled.     He had an echocardiogram: Left ventricle: Wall thickness was increased in a pattern of moderate LVH. Systolic function was  vigorous. The estimated ejection fraction was in the range of 75% to 80%. Wall motion was normal; there were no regional wall motion abnormalities. - Aortic valve: Trivial regurgitation. - Left atrium: The atrium was mildly to moderately dilated. - Right ventricle: The cavity size was mildly dilated. Systolic function was mildly reduced. - Right atrium: The atrium was moderately dilated. - Pulmonary arteries: Systolic pressure was mildly increased. PA peak pressure: 63mm Hg  He was found to have mild pulmonary hypertension.   He continues to have some dyspnea.  He has ankle edema.  His BP log shows mildly elevated BP.  May 26, 2013: [name] has had 2 V CABG and MAZE   (April 05, 2013)  since I last saw him.   . He has done well He is having some tenderness along the right thigh SVG harvest site.  Also c/o left 4th and 5th finger tingling / numbness.    He has otherwise done well.  No cardiac complaints.     Current Outpatient Prescriptions on File Prior to Visit  Medication Sig Dispense Refill  . acetaminophen (TYLENOL) 500 MG tablet Take 1,000 mg by mouth every 6 (six) hours as needed for mild pain.       [name] ALPRAZolam (XANAX) 0.25 MG tablet Take 0.25 mg by mouth at bedtime as needed for sleep.       [name] amiodarone (PACERONE) 200 MG tablet Take 200 mg by mouth daily.      [name] amLODipine (NORVASC) 10 MG tablet Take 10 mg by mouth 2 (two) times daily.       [name] aspirin EC 81 MG EC tablet Take 1 tablet (81 mg total) by mouth daily.      [name] atorvastatin (LIPITOR) 20 MG tablet Take 20 mg by mouth 3 (three) times a week. Monday Wednesday and Friday      . BESIVANCE 0.6 % SUSP Place 1 drop into the left eye 4 (four) times daily as needed (after injection).       . cloNIDine (CATAPRES) 0.2 MG tablet Take 0.2 mg by mouth 2 (two) times daily.      . diphenhydrAMINE (BENADRYL) 25 MG tablet Take 25 mg by mouth every 6 (six) hours as needed for allergies.       . furosemide (LASIX) 40 MG tablet Take 40 mg by mouth 3 (three) times daily.      [name] glimepiride (AMARYL) 2 MG tablet Take 2 mg by mouth daily with breakfast.      . HYDROcodone-acetaminophen (VICODIN) 5-500 MG per tablet Take 1 tablet by mouth every 6 (six) hours as needed for pain.      . metFORMIN (GLUMETZA) 500 MG (MOD) 24 hr tablet Take 1,000 mg by mouth daily with breakfast.      . metoprolol tartrate (LOPRESSOR) 25 MG tablet Take 12.5 mg by mouth 2 (two) times daily.      . minoxidil (LONITEN) 10 MG tablet Take 10 mg by mouth at bedtime.       . nitroGLYCERIN (NITROSTAT) 0.4 MG SL tablet Place 1 tablet (0.4 mg total) under the tongue every 5 (five) minutes x 3 doses as needed for chest pain.  25 tablet  3  .  Potassium Chloride (KCL-20 PO) Take 20 mEq by mouth 3 (three) times daily.      . ranitidine (ZANTAC) 150 MG tablet Take 150 mg by mouth daily as needed for heartburn.       . sertraline (ZOLOFT) 100 MG tablet Take  100 mg by mouth daily.      . valsartan (DIOVAN) 320 MG tablet Take 320 mg by mouth daily.      [name] warfarin (COUMADIN) 5 MG tablet Take 5 mg by mouth daily at 6 PM.        No current facility-administered medications on file prior to visit.    Allergies  Allergen Reactions  . Horse-Derived Products Other (See Comments)    Based on skin test reaction  . Other Other (See Comments)    Tetanus Shot -- skin test reaction  . Sunflower Seed [Sunflower Oil] Swelling    Tongue and lip swelling    Past Medical History  Diagnosis Date  . Hx of adenomatous colonic polyps   . Diabetes mellitus type II   . Hypertension   . Diverticulosis 2001  . Hyperlipidemia   . Persistent atrial fibrillation     a. on amio and coumadin;  b. s/p MAZE 04/2013 in setting of CABG.  . Obstructive sleep apnea     compliant with CPAP  . Chronic diastolic congestive heart failure   . Hypertensive cardiomyopathy   . History of cardioversion   . H/O hiatal hernia   . Cancer     Basal cell carcinoma  . Adrenal adenoma   . CAD (coronary artery disease)     a. 04/2013 CABG x 2: LIMA to LAD, SVG to RI, EVH via R thigh.  . S/P Maze operation for atrial fibrillation     a. 04/2013: Complete bilateral atrial lesion set using cryothermy and bipolar radiofrequency ablation with clipping of LA appendage (@ time of CABG)  . Type II or unspecified type diabetes mellitus without mention of complication, not stated as uncontrolled   . GERD (gastroesophageal reflux disease)   . Depression   . Anxiety   . DJD (degenerative joint disease)     Past Surgical History  Procedure Laterality Date  . Polinydal cyst      Removed  . Great toe arthrodesis, interphalangeal joint      Right foot  . Retina repair-right     . Cataract extraction      bilateral  . Basal cell carcinoma excision      x3 on face  . Polypectomy    . Cardiac catheterization      myocardial bridge but no cad  . Coronary artery bypass graft N/A 04/05/2013    Procedure: CORONARY ARTERY BYPASS GRAFTING (CABG) TIMES TWO USING LEFT INTERNAL MAMMARY ARTERY AND RIGHT SAPHENOUS LEG VEIN HARVESTED ENDOSCOPICALLY;  Surgeon: Rexene Alberts, MD;  Location: [facility];  Service: Open Heart Surgery;  Laterality: N/A;  . Maze N/A 04/05/2013    Procedure: MAZE;  Surgeon: Rexene Alberts, MD;  Location: [facility];  Service: Open Heart Surgery;  Laterality: N/A;  . Intraoperative transesophageal echocardiogram N/A 04/05/2013    Procedure: INTRAOPERATIVE TRANSESOPHAGEAL ECHOCARDIOGRAM;  Surgeon: Rexene Alberts, MD;  Location: [facility];  Service: Open Heart Surgery;  Laterality: N/A;    History  Smoking status  . Former Smoker -- 4.00 packs/day  . Types: Cigarettes  . Quit date: 01/05/1981  Smokeless tobacco  . Never Used    Comment: Stopped 1983    History  Alcohol Use  . Yes    Comment: 1-5 drinks per week    Family History  Problem Relation Age of Onset  . Colon cancer Mother     Family History/Uncle   . Colon polyps Mother     Family History  .  Atrial fibrillation Mother   . Hypertension Mother   . Colon polyps Sister     Family history  . Diabetes Maternal Uncle   . Stroke Paternal Uncle   . Dementia Father     Reviw of Systems:  Reviewed in the HPI.  All other systems are negative.  Physical Exam: BP 116/69  Pulse 67  Ht 5\' 11"  (1.803 m)  Wt 185 lb 8 oz (84.142 kg)  BMI 25.88 kg/m2 The patient is alert and oriented x 3.  The mood and affect are normal.   Skin: warm and dry.  Color is normal.    HEENT:   the sclera are nonicteric.  The mucous membranes are moist.  The carotids are 2+ without bruits.  There is no thyromegaly.  There is no JVD.   Lungs: clear.  The chest wall is non tender.   Heart: RR.   with a normal S1 and S2.   He has a hyperdynamic PMI. He has a very soft but brief 2/6 systolic murmur at the left cardiac  border. 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 Abdomen: good bowel sounds.  There is no guarding or rebound.  There is no hepatosplenomegaly or tenderness.  There are no masses.  Extremities:  no clubbing, cyanosis, or edema.  The legs are without rashes.  The distal pulses are intact.  Neuro:  Cranial nerves II - XII are intact.  Motor and sensory functions are intact.   The gait is normal.  ECG:  Probably NSR at 67 ( poor baseline, lots of artifact).  NS ST abnl.   Assessment / Plan:

## 2013-05-26 NOTE — Patient Instructions (Addendum)
Your physician recommends that you schedule a follow-up appointment in:  3 months with Dr. Acie Fredrickson  We will do an EKG at that time

## 2013-05-26 NOTE — Assessment & Plan Note (Signed)
He seems to be stable.  ECG has lots of artifact but he appears to have some P waves c/w NSR.   He has recovered nicely from surgery.    I will see him in 3 months with repeat ECG. Continue current meds

## 2013-05-31 ENCOUNTER — Encounter (HOSPITAL_COMMUNITY)
Admission: RE | Admit: 2013-05-31 | Discharge: 2013-05-31 | Disposition: A | Discharge status: Home / Self Care | Payer: Medicare Other | Source: Ambulatory Visit | Attending: Cardiovascular Disease | Admitting: Cardiovascular Disease

## 2013-05-31 DIAGNOSIS — I110 Hypertensive heart disease with heart failure: Secondary | ICD-10-CM | POA: Diagnosis not present

## 2013-05-31 DIAGNOSIS — Z5189 Encounter for other specified aftercare: Secondary | ICD-10-CM | POA: Diagnosis not present

## 2013-05-31 DIAGNOSIS — E782 Mixed hyperlipidemia: Secondary | ICD-10-CM | POA: Diagnosis not present

## 2013-05-31 DIAGNOSIS — I4891 Unspecified atrial fibrillation: Secondary | ICD-10-CM | POA: Diagnosis present

## 2013-05-31 DIAGNOSIS — Z951 Presence of aortocoronary bypass graft: Secondary | ICD-10-CM | POA: Diagnosis not present

## 2013-05-31 DIAGNOSIS — I5032 Chronic diastolic (congestive) heart failure: Secondary | ICD-10-CM | POA: Diagnosis not present

## 2013-05-31 LAB — GLUCOSE, CAPILLARY
Glucose-Capillary: 187 mg/dL — ABNORMAL HIGH (ref 70–99)
Glucose-Capillary: 230 mg/dL — ABNORMAL HIGH (ref 70–99)

## 2013-05-31 NOTE — Progress Notes (Signed)
Pt in today for his first day of exercise in cardiac rehab phase II.  Monitor shows sr with no noted ectopy (wavy baseline with artifact)   Pt tolerated light exercise with no complaints.  Medication list reconciled.  PHQ2 score 0.  Pt  Remarks that improvement has been great in the last 8-9 days.  He can now see himself getting back to before surgery condition.  Short term goal - to increase endurance.  Will be able to track this with MET level reviews. Long term goal is to be at 100%.  Will continue to track pt progress toward this goal.  Maurice Small RN, BSN

## 2013-06-01 ENCOUNTER — Ambulatory Visit (INDEPENDENT_AMBULATORY_CARE_PROVIDER_SITE_OTHER): Payer: Medicare Other

## 2013-06-01 ENCOUNTER — Telehealth: Payer: Self-pay | Admitting: Nurse Practitioner

## 2013-06-01 DIAGNOSIS — Z7901 Long term (current) use of anticoagulants: Secondary | ICD-10-CM

## 2013-06-01 LAB — PROTIME-INR
INR: 1.65 — ABNORMAL HIGH (ref <1.50)
PROTHROMBIN TIME: 19.2 s — AB (ref 11.6–15.2)

## 2013-06-01 NOTE — Telephone Encounter (Signed)
Received fax from [facility] with patient's monitor tracing from exercise on 5/21.  Dr. Acie Fredrickson has noted acknowledgment and no issues

## 2013-06-01 NOTE — Progress Notes (Signed)
Patient ID: [name], male   DOB: 02-Jan-1941, 73 y.o.   MRN: [id number] OP Patient here today to recheck PT-INR. Patient verifies current dosage as Coumadin 5 mg daily.

## 2013-06-02 ENCOUNTER — Other Ambulatory Visit: Payer: Self-pay | Admitting: Thoracic Surgery (Cardiothoracic Vascular Surgery)

## 2013-06-03 ENCOUNTER — Other Ambulatory Visit: Payer: Self-pay | Admitting: Cardiovascular Disease

## 2013-06-05 ENCOUNTER — Encounter (HOSPITAL_COMMUNITY)
Admission: RE | Admit: 2013-06-05 | Discharge: 2013-06-05 | Disposition: A | Discharge status: Home / Self Care | Payer: Medicare Other | Source: Ambulatory Visit | Attending: Cardiovascular Disease | Admitting: Cardiovascular Disease

## 2013-06-05 DIAGNOSIS — Z5189 Encounter for other specified aftercare: Secondary | ICD-10-CM | POA: Insufficient documentation

## 2013-06-05 DIAGNOSIS — I4891 Unspecified atrial fibrillation: Secondary | ICD-10-CM | POA: Insufficient documentation

## 2013-06-05 DIAGNOSIS — Z951 Presence of aortocoronary bypass graft: Secondary | ICD-10-CM | POA: Insufficient documentation

## 2013-06-05 DIAGNOSIS — I2510 Atherosclerotic heart disease of native coronary artery without angina pectoris: Secondary | ICD-10-CM | POA: Insufficient documentation

## 2013-06-05 DIAGNOSIS — E119 Type 2 diabetes mellitus without complications: Secondary | ICD-10-CM | POA: Insufficient documentation

## 2013-06-05 DIAGNOSIS — E785 Hyperlipidemia, unspecified: Secondary | ICD-10-CM | POA: Insufficient documentation

## 2013-06-05 DIAGNOSIS — I10 Essential (primary) hypertension: Secondary | ICD-10-CM | POA: Insufficient documentation

## 2013-06-05 LAB — GLUCOSE, CAPILLARY
Glucose-Capillary: 166 mg/dL — ABNORMAL HIGH (ref 70–99)
Glucose-Capillary: 146 mg/dL — ABNORMAL HIGH (ref 70–99)

## 2013-06-06 ENCOUNTER — Other Ambulatory Visit: Payer: Self-pay | Admitting: Dermatology

## 2013-06-06 ENCOUNTER — Other Ambulatory Visit: Payer: Self-pay | Admitting: Thoracic Surgery (Cardiothoracic Vascular Surgery)

## 2013-06-07 ENCOUNTER — Other Ambulatory Visit: Payer: Self-pay | Admitting: *Deleted

## 2013-06-07 ENCOUNTER — Encounter (HOSPITAL_COMMUNITY): Payer: Medicare Other

## 2013-06-07 ENCOUNTER — Encounter (INDEPENDENT_AMBULATORY_CARE_PROVIDER_SITE_OTHER): Payer: Medicare Other | Admitting: Ophthalmology

## 2013-06-07 DIAGNOSIS — H348392 Tributary (branch) retinal vein occlusion, unspecified eye, stable: Secondary | ICD-10-CM

## 2013-06-07 DIAGNOSIS — H43819 Vitreous degeneration, unspecified eye: Secondary | ICD-10-CM

## 2013-06-07 DIAGNOSIS — H35039 Hypertensive retinopathy, unspecified eye: Secondary | ICD-10-CM

## 2013-06-07 DIAGNOSIS — I10 Essential (primary) hypertension: Secondary | ICD-10-CM

## 2013-06-07 DIAGNOSIS — R609 Edema, unspecified: Secondary | ICD-10-CM

## 2013-06-07 DIAGNOSIS — H3530 Unspecified macular degeneration: Secondary | ICD-10-CM

## 2013-06-07 DIAGNOSIS — H33009 Unspecified retinal detachment with retinal break, unspecified eye: Secondary | ICD-10-CM

## 2013-06-07 MED ORDER — FUROSEMIDE 40 MG PO TABS: 40.0000 mg | ORAL_TABLET | Freq: Three times a day (TID) | ORAL | Status: DC

## 2013-06-12 ENCOUNTER — Encounter (HOSPITAL_COMMUNITY)
Admission: RE | Admit: 2013-06-12 | Discharge: 2013-06-12 | Disposition: A | Discharge status: Home / Self Care | Payer: Medicare Other | Source: Ambulatory Visit | Attending: Cardiovascular Disease | Admitting: Cardiovascular Disease

## 2013-06-12 LAB — GLUCOSE, CAPILLARY: GLUCOSE-CAPILLARY: 186 mg/dL — AB (ref 70–99)

## 2013-06-12 NOTE — Progress Notes (Signed)
Reviewed home exercise with pt today.  Pt plans to continue walking and going to [facility] for exercise.  Reviewed THR, pulse, RPE, sign and symptoms, and when to call 911 or MD.  Pt voiced understanding. Alberteen Sam, MA, ACSM RCEP

## 2013-06-14 ENCOUNTER — Encounter (HOSPITAL_COMMUNITY)
Admission: RE | Admit: 2013-06-14 | Discharge: 2013-06-14 | Disposition: A | Discharge status: Home / Self Care | Payer: Medicare Other | Source: Ambulatory Visit | Attending: Cardiovascular Disease | Admitting: Cardiovascular Disease

## 2013-06-19 ENCOUNTER — Encounter (HOSPITAL_COMMUNITY)
Admission: RE | Admit: 2013-06-19 | Discharge: 2013-06-19 | Disposition: A | Discharge status: Home / Self Care | Payer: Medicare Other | Source: Ambulatory Visit | Attending: Cardiovascular Disease | Admitting: Cardiovascular Disease

## 2013-06-19 NOTE — Progress Notes (Signed)
[name] 73 y.o. male Nutrition Note Spoke with pt. Nutrition Plan and Nutrition Survey goals reviewed with pt. Pt is following Step 2 of the Therapeutic Lifestyle Changes diet. Pt is diabetic. Last A1c indicates blood glucose well-controlled. Pt states he checks his CBG's 3 times/week "unless I notice a trend up or down." Pt reports his fasting CBG's are "usually in the mid-120's." This writer went over Diabetes Education test results. Pt is taking Coumadin. Pt aware of the need to follow a diet consistent in vitamin K intake. Pt expressed understanding of the information reviewed. Pt aware of nutrition education classes offered and plans on attending nutrition classes.  Nutrition Diagnosis   Food-and nutrition-related knowledge deficit related to lack of exposure to information as related to diagnosis of: ? CVD ? DM (A1c 7.0)  Nutrition RX/ Estimated Daily Nutrition Needs for: wt maintenance 2250-2600 Kcal, 75-85 gm fat, 15-17 gm sat fat, 2.2-2.6 gm trans-fat, <1500 mg sodium, 325 gm CHO   Nutrition Intervention   Pt's individual nutrition plan reviewed with pt.   Benefits of adopting Therapeutic Lifestyle Changes discussed when Medficts reviewed.   Pt to attend the Portion Distortion class   Pt to attend the  ? Nutrition I class                     ? Nutrition II class - met; 06/13/13        ? Diabetes Blitz class   Continue client-centered nutrition education by RD, as part of interdisciplinary care. Goal(s)   Pt to describe the benefit of including fruits, vegetables, whole grains, and low-fat dairy products in a heart healthy meal plan.   CBG concentrations in the normal range or as close to normal as is safely possible. Monitor and Evaluate progress toward nutrition goal with team. Nutrition Risk: Change to Moderate [name], M.Ed, RD, LDN, CDE 06/19/2013 12:15 PM

## 2013-06-20 ENCOUNTER — Ambulatory Visit (INDEPENDENT_AMBULATORY_CARE_PROVIDER_SITE_OTHER): Payer: Medicare Other | Admitting: Physician Assistant

## 2013-06-20 ENCOUNTER — Encounter: Payer: Self-pay | Admitting: Physician Assistant

## 2013-06-20 ENCOUNTER — Other Ambulatory Visit: Payer: Self-pay | Admitting: Emergency Medicine

## 2013-06-20 VITALS — BP 118/62 | HR 64 | Temp 98.2°F | Resp 16 | Ht 71.5 in | Wt 192.0 lb

## 2013-06-20 DIAGNOSIS — I4891 Unspecified atrial fibrillation: Secondary | ICD-10-CM

## 2013-06-20 LAB — PROTIME-INR
INR: 1.49 (ref <1.50)
PROTHROMBIN TIME: 17.8 s — AB (ref 11.6–15.2)

## 2013-06-20 NOTE — Progress Notes (Signed)
Coumadin follow up  Patient is on Coumadin for Primary Diagnosis: Atrial fibrillation [427.31] Patient's last INR is  Lab Results  Component Value Date   INR 1.65* 06/01/2013   INR 1.67* 05/19/2013   INR 1.78* 05/08/2013    Patient denies SOB, CP, dizziness, nose bleeds, easy bleeding, and blood in stool/urine. His coumadin dose was changed last visit to 1.5 tab=7.5 mg 3 x week on MWF and 1 tab= 5mg  the other 4 days/week TThSS  He is doing cardiac rehab currently and feels like it is helping.  He states he has left sided lateral hand feels numb, and some shoulder pain.    Current Outpatient Prescriptions on File Prior to Visit  Medication Sig Dispense Refill  . acetaminophen (TYLENOL) 500 MG tablet Take 1,000 mg by mouth every 6 (six) hours as needed for mild pain.       [name] ALPRAZolam (XANAX) 0.25 MG tablet Take 0.25 mg by mouth at bedtime as needed for sleep.       [name] amiodarone (PACERONE) 200 MG tablet Take 200 mg by mouth daily.      [name] amLODipine (NORVASC) 10 MG tablet Take 10 mg by mouth 2 (two) times daily.       [name] aspirin EC 81 MG EC tablet Take 1 tablet (81 mg total) by mouth daily.      [name] atorvastatin (LIPITOR) 20 MG tablet Take 20 mg by mouth 3 (three) times a week. Monday Wednesday and Friday      . BESIVANCE 0.6 % SUSP Place 1 drop into the left eye 4 (four) times daily as needed (after injection).       . cloNIDine (CATAPRES) 0.2 MG tablet Take 0.2 mg by mouth 2 (two) times daily.      . diphenhydrAMINE (BENADRYL) 25 MG tablet Take 25 mg by mouth every 6 (six) hours as needed for allergies.       [name] glimepiride (AMARYL) 2 MG tablet Take 2 mg by mouth daily with breakfast.      . HYDROcodone-acetaminophen (VICODIN) 5-500 MG per tablet Take 1 tablet by mouth every 6 (six) hours as needed for pain.      . metFORMIN (GLUMETZA) 500 MG (MOD) 24 hr tablet Take 1,000 mg by mouth daily with breakfast.      . metoprolol tartrate (LOPRESSOR) 25 MG tablet Take 12.5 mg by mouth 2 (two) times  daily.      . minoxidil (LONITEN) 10 MG tablet Take 10 mg by mouth at bedtime.       . nitroGLYCERIN (NITROSTAT) 0.4 MG SL tablet Place 1 tablet (0.4 mg total) under the tongue every 5 (five) minutes x 3 doses as needed for chest pain.  25 tablet  3  . Potassium Chloride (KCL-20 PO) Take 20 mEq by mouth 3 (three) times daily.      . ranitidine (ZANTAC) 150 MG tablet Take 150 mg by mouth daily as needed for heartburn.       . sertraline (ZOLOFT) 100 MG tablet Take 100 mg by mouth daily.      . valsartan (DIOVAN) 320 MG tablet TAKE 1 TABLET (320 MG TOTAL) BY MOUTH DAILY.  30 tablet  2  . warfarin (COUMADIN) 5 MG tablet Take 5 mg by mouth daily at 6 PM.        No current facility-administered medications on file prior to visit.   Past Medical History  Diagnosis Date  . Hx of adenomatous colonic polyps   . Diabetes  mellitus type II   . Hypertension   . Diverticulosis 2001  . Hyperlipidemia   . Persistent atrial fibrillation     a. on amio and coumadin;  b. s/p MAZE 04/2013 in setting of CABG.  . Obstructive sleep apnea     compliant with CPAP  . Chronic diastolic congestive heart failure   . Hypertensive cardiomyopathy   . History of cardioversion   . H/O hiatal hernia   . Cancer     Basal cell carcinoma  . Adrenal adenoma   . CAD (coronary artery disease)     a. 04/2013 CABG x 2: LIMA to LAD, SVG to RI, EVH via R thigh.  . S/P Maze operation for atrial fibrillation     a. 04/2013: Complete bilateral atrial lesion set using cryothermy and bipolar radiofrequency ablation with clipping of LA appendage (@ time of CABG)  . Type II or unspecified type diabetes mellitus without mention of complication, not stated as uncontrolled   . GERD (gastroesophageal reflux disease)   . Depression   . Anxiety   . DJD (degenerative joint disease)    Allergies  Allergen Reactions  . Horse-Derived Products Other (See Comments)    Based on skin test reaction  . Other Other (See Comments)    Tetanus  Shot -- skin test reaction  . Sunflower Seed [Sunflower Oil] Swelling    Tongue and lip swelling    ROS Constitutional: Denies fever, chills, headaches, fatigue. Cardio: Denies chest pain, palpitations, irregular heartbeat, syncope, dyspnea, diaphoresis, orthopnea, PND, claudication, edema Respiratory: denies cough, dyspnea, DOE, pleurisy, hoarseness, laryngitis, wheezing.  Gastrointestinal: Denies dysphagia, heartburn, reflux, pain, cramps, nausea, diarrhea, constipation, hematemesis, melena, hematochezia Genitourinary: Denies dysuria, frequency, hematuria, flank pain Musculoskeletal: + arthralgia left shoulder and paresthesia left hand Denies myalgia, stiffness, Jt. Swelling, pain, limp, strain/sprain. Skin: Denies rash, ecchymosis, petechial. Neuro: Denies Weakness, tremor, incoordination, spasms, pain Heme/Lymph: Denies Excessive bleeding, bruising, enlarged lymph nodes  Physical: Blood pressure 118/62, pulse 64, temperature 98.2 F (36.8 C), resp. rate 16, height 5' 11.5" (1.816 m), weight 192 lb (87.091 kg). Filed Weights   06/20/13 0901  Weight: 192 lb (87.091 kg)    General Appearance: Well nourished, in no apparent distress. ENT/Mouth: Nares clear with no erythema, swelling, mucus on turbinates. No ulcers, cracking, on lips. No erythema, swelling, or exudate on post pharynx.  Neck: Supple, thyroid normal.  Respiratory: CTAB   Cardio: RRR 3/6 systolic murmur,no  rubs or gallops. mild edema  Abdomen: Soft, obese, with bowl sounds. Non tender, no guarding, rebound, hernias, masses, or organomegaly.  Skin: Warm, dry without rashes, lesions, ecchymosis.  Neuro: Unremarkable  Assessment and plan: Chronic anticoagulation- check INR and will adjust medication according to labs.  Discussed if patient falls to immediately contact office or go to ER. Discussed foods that can increase or decrease Coumadin levels. Patient understands to call the office before starting a new  medication. Follow up in one month.

## 2013-06-20 NOTE — Patient Instructions (Signed)
Your PT/INR is the test we use to check your coumadin level.  Your goal for your INR is between 2-3.  If you number is below 2 than your blood is thick and you need more coumadin. You are at risk for stroke or clotting during this time period.  If you number is above 3 than your blood is too thin and you need less coumadin or can eat more greens at this time. You are at risk for stomach or head bleeds and need to be careful.   Warfarin Coagulopathy Warfarin (Coumadin) coagulopathy refers to bleeding that may occur as a complication of the medicine warfarin. Warfarin is an oral blood thinner (anticoagulant). Warfarin is used for medical conditions where thinning of the blood is needed to prevent blood clots.  CAUSES Bleeding is the most common and most serious complication of warfarin. The amount of bleeding is related to the warfarin dose and length of treatment. In addition, bleeding complications can also occur due to:  Intentional or accidental warfarin overdose.  Underlying medical conditions.  Dietary changes.  Medicine, herbal, supplement, or alcohol interactions. SYMPTOMS Severe bleeding while on warfarin may occur from any tissue or organ. Symptoms of the blood being too thin may include:  Bleeding from the nose or gums.  Blood in bowel movements which may appear as bright red, dark, or black tarry stools.  Blood in the urine which may appear as pink, red, or brown urine.  Unusual bruising or bruising easily.  A cut that does not stop bleeding within 10 minutes.  Vomiting blood or continuous nausea for more than 1 day.  Coughing up blood.  Broken blood vessels in your eye (subconjunctival hemorrhage).  Abdominal or back pain with or without flank bruising.  Sudden, severe headache.  Sudden weakness or numbness of the face, arm, or leg, especially on one side of the body.  Sudden confusion.  Trouble speaking (aphasia) or understanding.  Sudden trouble seeing in  one or both eyes.  Sudden trouble walking.  Dizziness.  Loss of balance or coordination.  Vaginal bleeding.  Swelling or pain at an injection site.  Superficial fat tissue death (necrosis) which may cause skin scarring. This is more common in women and may first present as pain in the waist, thighs, and buttocks.  Fever. HOME CARE INSTRUCTIONS  Always contact your caregiver of any concerns or signs of possible warfarin coagulopathy as soon as possible.  Take warfarin exactly as directed by your caregiver. It is recommended that you take your warfarin dose at the same time of the day. It is preferred that you take warfarin in the late afternoon. If you have been told to stop taking warfarin, do not resume taking warfarin until directed to do so by your caregiver. Follow your caregiver's instructions if you accidentally take an extra dose or miss a dose of warfarin. It is very important to take warfarin as directed since bleeding or blood clots could result in chronic or permanent injury, pain, or disability.  Keep all follow-up appointments with your caregiver as directed. It is very important to keep your appointments. Not keeping appointments could result in a chronic or permanent injury, pain, or disability because warfarin is a medicine that requires close monitoring.  While taking warfarin, you will need to have regular blood tests to measure your blood clotting time. These blood tests usually include both the prothrombin time (PT) and International Normalized Ratio (INR) tests. The PT and INR results allow your caregiver to adjust  your dose of warfarin. The dose can change for many reasons. It is critically important that you have your PT and INR levels drawn exactly as directed. PT and INR lab draws are usually done in the morning. Your warfarin dose may stay the same or change depending on what the PT and INR results are. Be sure to follow up with your caregiver regarding your PT and  INR test results and what your warfarin dosage should be.  Many medicines can interfere with warfarin and affect the PT and INR results. You must tell your caregiver about any and all medicines you take, this includes all vitamins and supplements. Ask your caregiver before taking these. Prescription and over-the-counter medicine consistency is critical to warfarin management. It is important that potential interactions are checked before you start a new medicine. Be especially cautious with aspirin and anti-inflammatory medicines. Ask your caregiver before taking these. Medicines such as antibiotics and acid-reducing medicine can interact with warfarin and can cause an increased warfarin effect. Warfarin can also interfere with the effectiveness of medicines you are taking. Do not take or discontinue any prescribed or over-the-counter medicine except on the advice of your caregiver or pharmacist.  Some vitamins, supplements, and herbal products interfere with the effectiveness of warfarin. Vitamin E may increase the anticoagulant effects of warfarin. Vitamin K may can cause warfarin to be less effective. Do not take or discontinue any vitamin, supplement, or herbal product except on the advice of your caregiver or pharmacist.  Some foods, especially foods high in vitamin K can interfere with the effectiveness of warfarin and affect the PT and INR results. A diet too high in vitamin K can cause warfarin to be less effective. A diet too low in foods containing vitamin K may lead to an excessive warfarin effect. Foods high in vitamin K include spinach, kale, broccoli, cabbage, collard and turnip greens, brussels sprouts, peas, cauliflower, seaweed, and parsley as well as beef and pork liver, green tea, and soybean oil. Eat what you normally eat and keep the vitamin K content of your diet consistent. Avoid major changes in your diet, or notify your caregiver before changing your diet. Arrange a visit with a  dietitian to answer your questions.  If you have a loss of appetite or get the stomach flu (viral gastroenteritis), talk to your caregiver as soon as possible. A decrease in your normal vitamin K intake can make you more sensitive to your usual dose of warfarin.  Some medical conditions may increase your risk for bleeding while you are taking warfarin. A fever, diarrhea lasting more than a day, worsening heart failure, or worsening liver function are some medical conditions that could affect warfarin. Contact your caregiver if you have any of these medical conditions.  Be careful not to cut yourself when using sharp objects or while shaving.  Alcohol can change the body's ability to handle warfarin. It is best to avoid alcoholic drinks or consume only very small amounts while taking warfarin. Notify your caregiver if you change your alcohol intake. A sudden increase in alcohol use can increase your risk of bleeding. Chronic alcohol use can cause warfarin to be less effective.  Limit physical activities or sports that could result in a fall or cause injury.  Do not use warfarin if you are pregnant.  Inform all your caregivers and your dentist that you take warfarin.  Inform all caregivers if you are taking warfarin and aspirin or platelet inhibitor medicines such as clopidogrel, ticagrelor, or  prasugrel. Use of these medicines in conjunction with warfarin can increase your risk of bleeding or death. Taking these medicines together should only be done under the direct care of your caregiver. SEEK IMMEDIATE MEDICAL CARE IF:  You cough up blood.  You have dark or black stools or there is bright red blood coming from your rectum.  You vomit blood or have nausea for more than 1 day.  You have blood in the urine or pink colored urine.  You have unusual bruising or have increased bruising.  You have bleeding from the nose or gums that does not stop quickly.  You have a cut that does not stop  bleeding within a 2 3 minutes.  You have sudden weakness or numbness of the face, arm, or leg, especially on one side of the body.  You have sudden confusion.  You have trouble speaking (aphasia) or understanding.  You have sudden trouble seeing in one or both eyes.  You have sudden trouble walking.  You have dizziness.  You have a loss of balance or coordination.  You have a sudden, severe headache.  You have a serious fall or head injury, even if you are not bleeding.  You have swelling or pain at an injection site.  You have unexplained tenderness or pain in the abdomen, back, waist, thighs or buttocks.  You have a fever. Any of these symptoms may represent a serious problem that is an emergency. Do not wait to see if the symptoms will go away. Get medical help right away. Call your local emergency services (911 in [place] Do not drive yourself to the hospital. Document Released: 11/30/2005 Document Revised: 06/23/2011 Document Reviewed: 06/02/2011 [facility] Patient Information 2014 [place].

## 2013-06-21 ENCOUNTER — Encounter (HOSPITAL_COMMUNITY)
Admission: RE | Admit: 2013-06-21 | Discharge: 2013-06-21 | Disposition: A | Discharge status: Home / Self Care | Payer: Medicare Other | Source: Ambulatory Visit | Attending: Cardiovascular Disease | Admitting: Cardiovascular Disease

## 2013-06-21 LAB — GLUCOSE, CAPILLARY: GLUCOSE-CAPILLARY: 143 mg/dL — AB (ref 70–99)

## 2013-06-26 ENCOUNTER — Encounter (HOSPITAL_COMMUNITY)
Admission: RE | Admit: 2013-06-26 | Discharge: 2013-06-26 | Disposition: A | Discharge status: Home / Self Care | Payer: Medicare Other | Source: Ambulatory Visit | Attending: Cardiovascular Disease | Admitting: Cardiovascular Disease

## 2013-06-28 ENCOUNTER — Encounter: Payer: Self-pay | Admitting: Gastroenterology

## 2013-06-28 ENCOUNTER — Encounter (HOSPITAL_COMMUNITY)
Admission: RE | Admit: 2013-06-28 | Discharge: 2013-06-28 | Disposition: A | Discharge status: Home / Self Care | Payer: Medicare Other | Source: Ambulatory Visit | Attending: Cardiovascular Disease | Admitting: Cardiovascular Disease

## 2013-07-02 ENCOUNTER — Other Ambulatory Visit: Payer: Self-pay | Admitting: Surgical

## 2013-07-03 ENCOUNTER — Encounter (HOSPITAL_COMMUNITY)
Admission: RE | Admit: 2013-07-03 | Discharge: 2013-07-03 | Disposition: A | Discharge status: Home / Self Care | Payer: Medicare Other | Source: Ambulatory Visit | Attending: Cardiovascular Disease | Admitting: Cardiovascular Disease

## 2013-07-03 LAB — GLUCOSE, CAPILLARY: GLUCOSE-CAPILLARY: 221 mg/dL — AB (ref 70–99)

## 2013-07-04 ENCOUNTER — Ambulatory Visit (INDEPENDENT_AMBULATORY_CARE_PROVIDER_SITE_OTHER): Payer: Medicare Other

## 2013-07-04 DIAGNOSIS — Z79899 Other long term (current) drug therapy: Secondary | ICD-10-CM

## 2013-07-04 DIAGNOSIS — I4891 Unspecified atrial fibrillation: Secondary | ICD-10-CM

## 2013-07-04 NOTE — Progress Notes (Signed)
Patient ID: [name], male   DOB: 08-Apr-1940, 73 y.o.   MRN: [id number] Patient here today to recheck PT/INR, patient takes 7.5 mg Coumadin seven days a week.

## 2013-07-05 ENCOUNTER — Encounter (HOSPITAL_COMMUNITY)
Admission: RE | Admit: 2013-07-05 | Discharge: 2013-07-05 | Disposition: A | Discharge status: Home / Self Care | Payer: Medicare Other | Source: Ambulatory Visit | Attending: Cardiovascular Disease | Admitting: Cardiovascular Disease

## 2013-07-05 ENCOUNTER — Encounter (INDEPENDENT_AMBULATORY_CARE_PROVIDER_SITE_OTHER): Payer: Medicare Other | Admitting: Ophthalmology

## 2013-07-05 DIAGNOSIS — E785 Hyperlipidemia, unspecified: Secondary | ICD-10-CM | POA: Diagnosis not present

## 2013-07-05 DIAGNOSIS — H35039 Hypertensive retinopathy, unspecified eye: Secondary | ICD-10-CM

## 2013-07-05 DIAGNOSIS — I10 Essential (primary) hypertension: Secondary | ICD-10-CM | POA: Diagnosis not present

## 2013-07-05 DIAGNOSIS — H348392 Tributary (branch) retinal vein occlusion, unspecified eye, stable: Secondary | ICD-10-CM

## 2013-07-05 DIAGNOSIS — I4891 Unspecified atrial fibrillation: Secondary | ICD-10-CM | POA: Insufficient documentation

## 2013-07-05 DIAGNOSIS — Z5189 Encounter for other specified aftercare: Secondary | ICD-10-CM | POA: Insufficient documentation

## 2013-07-05 DIAGNOSIS — I2510 Atherosclerotic heart disease of native coronary artery without angina pectoris: Secondary | ICD-10-CM | POA: Diagnosis not present

## 2013-07-05 DIAGNOSIS — Z951 Presence of aortocoronary bypass graft: Secondary | ICD-10-CM | POA: Diagnosis not present

## 2013-07-05 DIAGNOSIS — E119 Type 2 diabetes mellitus without complications: Secondary | ICD-10-CM | POA: Diagnosis not present

## 2013-07-05 LAB — PROTIME-INR
INR: 1.85 — ABNORMAL HIGH (ref <1.50)
PROTHROMBIN TIME: 21.3 s — AB (ref 11.6–15.2)

## 2013-07-10 ENCOUNTER — Encounter: Payer: Self-pay | Admitting: Thoracic Surgery (Cardiothoracic Vascular Surgery)

## 2013-07-10 ENCOUNTER — Ambulatory Visit (INDEPENDENT_AMBULATORY_CARE_PROVIDER_SITE_OTHER): Payer: Medicare Other | Admitting: Thoracic Surgery (Cardiothoracic Vascular Surgery)

## 2013-07-10 ENCOUNTER — Encounter (HOSPITAL_COMMUNITY)
Admission: RE | Admit: 2013-07-10 | Discharge: 2013-07-10 | Disposition: A | Discharge status: Home / Self Care | Payer: Medicare Other | Source: Ambulatory Visit | Attending: Cardiovascular Disease | Admitting: Cardiovascular Disease

## 2013-07-10 VITALS — BP 122/65 | HR 60 | Resp 20 | Ht 71.5 in | Wt 190.0 lb

## 2013-07-10 DIAGNOSIS — Z9889 Other specified postprocedural states: Principal | ICD-10-CM

## 2013-07-10 DIAGNOSIS — Z8679 Personal history of other diseases of the circulatory system: Secondary | ICD-10-CM

## 2013-07-10 DIAGNOSIS — Z951 Presence of aortocoronary bypass graft: Secondary | ICD-10-CM

## 2013-07-10 DIAGNOSIS — Z5189 Encounter for other specified aftercare: Secondary | ICD-10-CM | POA: Diagnosis not present

## 2013-07-10 DIAGNOSIS — I2510 Atherosclerotic heart disease of native coronary artery without angina pectoris: Secondary | ICD-10-CM

## 2013-07-10 NOTE — Patient Instructions (Signed)
Stop taking amiodarone when your current prescription runs out

## 2013-07-10 NOTE — Progress Notes (Signed)
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  He was last seen here in the office on 05/15/2013.  Since then he has continued to do very well. He is actively participating in the cardiac rehabilitation program he reports that his exercise tolerance is improving nicely. He never had much pain in his chest. His breathing has returned to normal since he underwent thoracentesis in early May. This exercise tolerance is quite good. He denies any symptoms of tachypalpitations and to his knowledge she has remained in sinus rhythm. Overall he feels well and has no complaints.   Current Outpatient Prescriptions  Medication Sig Dispense Refill  . acetaminophen (TYLENOL) 500 MG tablet Take 1,000 mg by mouth every 6 (six) hours as needed for mild pain.       [name] ALPRAZolam (XANAX) 0.25 MG tablet Take 0.25 mg by mouth at bedtime as needed for sleep.       [name] amiodarone (PACERONE) 200 MG tablet Take 200 mg by mouth daily.      [name] amLODipine (NORVASC) 10 MG tablet Take 10 mg by mouth 2 (two) times daily.       [name] aspirin EC 81 MG EC tablet Take 1 tablet (81 mg total) by mouth daily.      [name] atorvastatin (LIPITOR) 20 MG tablet Take 20 mg by mouth 3 (three) times a week. Monday Wednesday and Friday      . BESIVANCE 0.6 % SUSP Place 1 drop into the left eye 4 (four) times daily as needed (after injection).       . cloNIDine (CATAPRES) 0.2 MG tablet Take 0.2 mg by mouth 2 (two) times daily.      . diphenhydrAMINE (BENADRYL) 25 MG tablet Take 25 mg by mouth every 6 (six) hours as needed for allergies.       . furosemide (LASIX) 40 MG tablet Take 40 mg by mouth 3 (three) times daily.      [name] glimepiride (AMARYL) 2 MG tablet Take 2 mg by mouth  daily with breakfast.      . HYDROcodone-acetaminophen (VICODIN) 5-500 MG per tablet Take 1 tablet by mouth every 6 (six) hours as needed for pain.      . metoprolol tartrate (LOPRESSOR) 25 MG tablet Take 12.5 mg by mouth 2 (two) times daily.      . minoxidil (LONITEN) 10 MG tablet Take 10 mg by mouth at bedtime.       . nitroGLYCERIN (NITROSTAT) 0.4 MG SL tablet Place 1 tablet (0.4 mg total) under the tongue every 5 (five) minutes x 3 doses as needed for chest pain.  25 tablet  3  . Potassium Chloride (KCL-20 PO) Take 20 mEq by mouth 3 (three) times daily.      . ranitidine (ZANTAC) 150 MG tablet Take 150 mg by mouth daily as needed for heartburn.       . sertraline (ZOLOFT) 100 MG tablet Take 100 mg by mouth daily.      . valsartan (DIOVAN) 320 MG tablet TAKE 1 TABLET (320 MG TOTAL) BY MOUTH DAILY.  30 tablet  2  . warfarin (COUMADIN) 5 MG tablet  Take 5 mg by mouth daily at 6 PM.        No current facility-administered medications for this visit.      Physical Exam:   BP 122/65  Pulse 60  Resp 20  Ht 5' 11.5" (1.816 m)  Wt 190 lb (86.183 kg)  BMI 26.13 kg/m2  SpO2 94%  General:  Well-appearing  Chest:   Clear to auscultation  CV:   Regular rate and rhythm without murmur  Incisions:  Completely healed, sternum is stable  Abdomen:  Soft nontender  Extremities:  Warm and well-perfused  Diagnostic Tests:  2 channel telemetry rhythm strip demonstrates normal sinus rhythm   Impression:  Patient is doing well 3 months following coronary artery bypass grafting x2 and Maze procedure. He is maintaining sinus rhythm. He is anticoagulated with Coumadin.    Plan:  I've instructed the patient to stop taking amiodarone when his current prescription runs out. He may resume unrestricted physical activity. The patient will followup in 3 months for rhythm check.    I spent in excess of 15 minutes during the conduct of this office consultation and >50% of this time involved direct  face-to-face encounter with the patient for counseling and/or coordination of their care.   Valentina Gu. Roxy Manns, MD 07/10/2013 2:30 PM

## 2013-07-12 ENCOUNTER — Encounter (HOSPITAL_COMMUNITY)
Admission: RE | Admit: 2013-07-12 | Discharge: 2013-07-12 | Disposition: A | Discharge status: Home / Self Care | Payer: Medicare Other | Source: Ambulatory Visit | Attending: Cardiovascular Disease | Admitting: Cardiovascular Disease

## 2013-07-12 DIAGNOSIS — Z5189 Encounter for other specified aftercare: Secondary | ICD-10-CM | POA: Diagnosis not present

## 2013-07-17 ENCOUNTER — Other Ambulatory Visit: Payer: Self-pay | Admitting: Cardiovascular Disease

## 2013-07-17 ENCOUNTER — Encounter (HOSPITAL_COMMUNITY)
Admission: RE | Admit: 2013-07-17 | Discharge: 2013-07-17 | Disposition: A | Discharge status: Home / Self Care | Payer: Medicare Other | Source: Ambulatory Visit | Attending: Cardiovascular Disease | Admitting: Cardiovascular Disease

## 2013-07-17 DIAGNOSIS — Z5189 Encounter for other specified aftercare: Secondary | ICD-10-CM | POA: Diagnosis not present

## 2013-07-19 ENCOUNTER — Encounter (HOSPITAL_COMMUNITY)
Admission: RE | Admit: 2013-07-19 | Discharge: 2013-07-19 | Disposition: A | Discharge status: Home / Self Care | Payer: Medicare Other | Source: Ambulatory Visit | Attending: Cardiovascular Disease | Admitting: Cardiovascular Disease

## 2013-07-19 DIAGNOSIS — Z5189 Encounter for other specified aftercare: Secondary | ICD-10-CM | POA: Diagnosis not present

## 2013-07-21 ENCOUNTER — Ambulatory Visit (INDEPENDENT_AMBULATORY_CARE_PROVIDER_SITE_OTHER): Payer: Medicare Other | Admitting: Internal Medicine

## 2013-07-21 ENCOUNTER — Encounter: Payer: Self-pay | Admitting: Internal Medicine

## 2013-07-21 VITALS — BP 142/74 | HR 64 | Temp 98.1°F | Resp 16 | Ht 71.0 in | Wt 196.6 lb

## 2013-07-21 DIAGNOSIS — Z79899 Other long term (current) drug therapy: Secondary | ICD-10-CM

## 2013-07-21 DIAGNOSIS — E782 Mixed hyperlipidemia: Secondary | ICD-10-CM

## 2013-07-21 DIAGNOSIS — E559 Vitamin D deficiency, unspecified: Secondary | ICD-10-CM

## 2013-07-21 DIAGNOSIS — Z7901 Long term (current) use of anticoagulants: Secondary | ICD-10-CM

## 2013-07-21 DIAGNOSIS — I10 Essential (primary) hypertension: Secondary | ICD-10-CM

## 2013-07-21 DIAGNOSIS — E1129 Type 2 diabetes mellitus with other diabetic kidney complication: Secondary | ICD-10-CM

## 2013-07-21 LAB — PROTIME-INR
INR: 1.57 — AB (ref <1.50)
Prothrombin Time: 18.8 seconds — ABNORMAL HIGH (ref 11.6–15.2)

## 2013-07-21 NOTE — Patient Instructions (Signed)

## 2013-07-21 NOTE — Progress Notes (Signed)
Patient ID: [name], male   DOB: 03-Oct-1940, 73 y.o.   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   Patient continues in Cardiac Rehab 2 x week and exercise at home 5 x week. BP has been controlled and today's BP: 142/74 mmHg. Patient denies any exertional cardiac type chest pain, palpitations, dyspnea/orthopnea/PND, dizziness, claudication, or dependent edema. Patient states he has been off his Amiodarone since his appt last week with Dr Ricard Dillon and has not had any noted palpitations.    Hyperlipidemia is controlled with diet & meds. Patient denies myalgias or other med SE's. Last Lipids were at goal in May with Chol 112 & LDL 61.      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 Patient denies any symptoms of reactive hypoglycemia, diabetic polys, paresthesias or visual blurring.   Further, Patient has history of Vitamin D Deficiency and last vitamin D was 65 in May 2015. Patient supplements vitamin D without any suspected side-effects.  Allergies  Allergen Reactions  . Horse-Derived Products Other (See Comments)    Based on skin test reaction  . Other Other (See Comments)    Tetanus Shot -- skin test reaction  . Sunflower Seed [Sunflower Oil] Swelling    Tongue and lip swelling   PMHx:   Past Medical History  Diagnosis Date  . Hx of adenomatous colonic polyps   . Diabetes mellitus type II   . Hypertension   . Diverticulosis 2001  . Hyperlipidemia   . Persistent atrial fibrillation     a. on amio and coumadin;  b. s/p MAZE 04/2013 in setting of CABG.  . Obstructive sleep apnea     compliant with CPAP  . Chronic diastolic congestive heart failure   . Hypertensive cardiomyopathy   . History of cardioversion   . H/O hiatal hernia   . Cancer     Basal cell carcinoma  . Adrenal adenoma   . CAD (coronary artery disease)      a. 04/2013 CABG x 2: LIMA to LAD, SVG to RI, EVH via R thigh.  . S/P Maze operation for atrial fibrillation     a. 04/2013: Complete bilateral atrial lesion set using cryothermy and bipolar radiofrequency ablation with clipping of LA appendage (@ time of CABG)  . Type II or unspecified type diabetes mellitus without mention of complication, not stated as uncontrolled   . GERD (gastroesophageal reflux disease)   . Depression   . Anxiety   . DJD (degenerative joint disease)    FHx:    Reviewed / unchanged  SHx:    Reviewed / unchanged  Systems Review:  Constitutional: Denies fever, chills, wt changes, headaches, insomnia, fatigue, night sweats, change in appetite. Eyes: Denies redness, blurred vision, diplopia, discharge, itchy, watery eyes.  ENT: Denies discharge, congestion, post nasal drip, epistaxis, sore throat, earache, hearing loss, dental pain, tinnitus, vertigo, sinus pain, snoring.  CV: Denies chest pain, palpitations, irregular heartbeat, syncope, dyspnea, diaphoresis, orthopnea, PND, claudication or edema. Respiratory: denies cough, dyspnea, DOE, pleurisy, hoarseness, laryngitis, wheezing.  Gastrointestinal: Denies dysphagia, odynophagia, heartburn, reflux, water brash, abdominal pain or cramps, nausea, vomiting, bloating, diarrhea, constipation, hematemesis, melena, hematochezia  or hemorrhoids. Genitourinary: Denies dysuria, frequency, urgency, nocturia, hesitancy, discharge, hematuria or flank pain. Musculoskeletal: Denies arthralgias, myalgias, stiffness, jt. swelling, pain, limping or strain/sprain.  Skin: Denies pruritus, rash, hives, warts, acne, eczema or change in skin lesion(s). Neuro: No weakness, tremor, incoordination, spasms, paresthesia or pain. Psychiatric: Denies confusion, memory loss or sensory loss. Endo: Denies change in weight, skin or hair change.  Heme/Lymph: No excessive bleeding, bruising or enlarged lymph nodes.  Exam:  BP 142/74  Pulse 64  T  98.1 F   R 16  Ht 5\' 11"    Wt 196 lb 9.6 oz   BMI 27.43 kg/m2  Appears well nourished and in no distress. Eyes: PERRLA, EOMs, conjunctiva no swelling or erythema. Sinuses: No frontal/maxillary tenderness ENT/Mouth: EAC's clear, TM's nl w/o erythema, bulging. Nares clear w/o erythema, swelling, exudates. Oropharynx clear without erythema or exudates. Oral hygiene is good. Tongue normal, non obstructing. Hearing intact.  Neck: Supple. Thyroid nl. Car 2+/2+ without bruits, nodes or JVD. Chest: Respirations nl with BS clear & equal w/o rales, rhonchi, wheezing or stridor.  Cor: Heart sounds normal w/ regular rate and rhythm without sig. murmurs, gallops, clicks, or rubs. Peripheral pulses normal and equal  without edema.  Abdomen: Soft & bowel sounds normal. Non-tender w/o guarding, rebound, hernias, masses, or organomegaly.  Lymphatics: Unremarkable.  Musculoskeletal: Full ROM all peripheral extremities, joint stability, 5/5 strength, and normal gait.  Skin: Warm, dry without exposed rashes, lesions or ecchymosis apparent.  Neuro: Cranial nerves intact, reflexes equal bilaterally. Sensory-motor testing grossly intact. Tendon reflexes grossly intact.  Pysch: Alert & oriented x 3. Insight and judgement nl & appropriate. No ideations.  Assessment and Plan:  1. Hypertension - Continue monitor blood pressure at home. Continue diet/meds same.  2. Hyperlipidemia - Continue diet/meds, exercise,& lifestyle modifications. Continue monitor periodic cholesterol/liver & renal functions   3. 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  4. Vitamin D Deficiency - Continue supplementation.  5. ASHD/CABG/Afib- Hx  Recommended regular exercise, BP monitoring, weight control, and discussed med and SE's. Recommended labs to assess and monitor clinical status. Further disposition pending results of  labs.

## 2013-07-23 ENCOUNTER — Other Ambulatory Visit: Payer: Self-pay | Admitting: Physician Assistant

## 2013-07-23 ENCOUNTER — Encounter: Payer: Self-pay | Admitting: Internal Medicine

## 2013-07-23 MED ORDER — WARFARIN SODIUM 5 MG PO TABS: 5.0000 mg | ORAL_TABLET | Freq: Every day | ORAL | Status: DC

## 2013-07-24 ENCOUNTER — Encounter (HOSPITAL_COMMUNITY)
Admission: RE | Admit: 2013-07-24 | Discharge: 2013-07-24 | Disposition: A | Discharge status: Home / Self Care | Payer: Medicare Other | Source: Ambulatory Visit | Attending: Cardiovascular Disease | Admitting: Cardiovascular Disease

## 2013-07-24 DIAGNOSIS — Z5189 Encounter for other specified aftercare: Secondary | ICD-10-CM | POA: Diagnosis not present

## 2013-07-26 ENCOUNTER — Encounter (HOSPITAL_COMMUNITY)
Admission: RE | Admit: 2013-07-26 | Discharge: 2013-07-26 | Disposition: A | Discharge status: Home / Self Care | Payer: Medicare Other | Source: Ambulatory Visit | Attending: Cardiovascular Disease | Admitting: Cardiovascular Disease

## 2013-07-26 DIAGNOSIS — Z5189 Encounter for other specified aftercare: Secondary | ICD-10-CM | POA: Diagnosis not present

## 2013-07-31 ENCOUNTER — Encounter (HOSPITAL_COMMUNITY)
Admission: RE | Admit: 2013-07-31 | Discharge: 2013-07-31 | Disposition: A | Discharge status: Home / Self Care | Payer: Medicare Other | Source: Ambulatory Visit | Attending: Cardiovascular Disease | Admitting: Cardiovascular Disease

## 2013-07-31 ENCOUNTER — Other Ambulatory Visit: Payer: Self-pay | Admitting: Cardiovascular Disease

## 2013-07-31 ENCOUNTER — Other Ambulatory Visit: Payer: Self-pay | Admitting: Surgical

## 2013-07-31 DIAGNOSIS — Z5189 Encounter for other specified aftercare: Secondary | ICD-10-CM | POA: Diagnosis not present

## 2013-08-02 ENCOUNTER — Encounter (HOSPITAL_COMMUNITY)
Admission: RE | Admit: 2013-08-02 | Discharge: 2013-08-02 | Disposition: A | Discharge status: Home / Self Care | Payer: Medicare Other | Source: Ambulatory Visit | Attending: Cardiovascular Disease | Admitting: Cardiovascular Disease

## 2013-08-02 DIAGNOSIS — Z5189 Encounter for other specified aftercare: Secondary | ICD-10-CM | POA: Diagnosis not present

## 2013-08-07 ENCOUNTER — Encounter (HOSPITAL_COMMUNITY)
Admission: RE | Admit: 2013-08-07 | Discharge: 2013-08-07 | Disposition: A | Discharge status: Home / Self Care | Payer: Medicare Other | Source: Ambulatory Visit | Attending: Cardiovascular Disease | Admitting: Cardiovascular Disease

## 2013-08-07 DIAGNOSIS — Z951 Presence of aortocoronary bypass graft: Secondary | ICD-10-CM | POA: Diagnosis not present

## 2013-08-07 DIAGNOSIS — I10 Essential (primary) hypertension: Secondary | ICD-10-CM | POA: Diagnosis not present

## 2013-08-07 DIAGNOSIS — I4891 Unspecified atrial fibrillation: Secondary | ICD-10-CM | POA: Diagnosis not present

## 2013-08-07 DIAGNOSIS — I2510 Atherosclerotic heart disease of native coronary artery without angina pectoris: Secondary | ICD-10-CM | POA: Diagnosis not present

## 2013-08-07 DIAGNOSIS — E785 Hyperlipidemia, unspecified: Secondary | ICD-10-CM | POA: Insufficient documentation

## 2013-08-07 DIAGNOSIS — Z5189 Encounter for other specified aftercare: Secondary | ICD-10-CM | POA: Diagnosis present

## 2013-08-07 DIAGNOSIS — E119 Type 2 diabetes mellitus without complications: Secondary | ICD-10-CM | POA: Insufficient documentation

## 2013-08-08 ENCOUNTER — Encounter (INDEPENDENT_AMBULATORY_CARE_PROVIDER_SITE_OTHER): Payer: Medicare Other | Admitting: Ophthalmology

## 2013-08-08 DIAGNOSIS — H35039 Hypertensive retinopathy, unspecified eye: Secondary | ICD-10-CM

## 2013-08-08 DIAGNOSIS — H33009 Unspecified retinal detachment with retinal break, unspecified eye: Secondary | ICD-10-CM

## 2013-08-08 DIAGNOSIS — H348392 Tributary (branch) retinal vein occlusion, unspecified eye, stable: Secondary | ICD-10-CM

## 2013-08-08 DIAGNOSIS — I10 Essential (primary) hypertension: Secondary | ICD-10-CM

## 2013-08-08 DIAGNOSIS — H43819 Vitreous degeneration, unspecified eye: Secondary | ICD-10-CM

## 2013-08-09 ENCOUNTER — Encounter (HOSPITAL_COMMUNITY)
Admission: RE | Admit: 2013-08-09 | Discharge: 2013-08-09 | Disposition: A | Discharge status: Home / Self Care | Payer: Medicare Other | Source: Ambulatory Visit | Attending: Cardiovascular Disease | Admitting: Cardiovascular Disease

## 2013-08-09 DIAGNOSIS — Z5189 Encounter for other specified aftercare: Secondary | ICD-10-CM | POA: Diagnosis not present

## 2013-08-14 ENCOUNTER — Encounter (HOSPITAL_COMMUNITY)
Admission: RE | Admit: 2013-08-14 | Discharge: 2013-08-14 | Disposition: A | Discharge status: Home / Self Care | Payer: Medicare Other | Source: Ambulatory Visit | Attending: Cardiovascular Disease | Admitting: Cardiovascular Disease

## 2013-08-14 DIAGNOSIS — Z5189 Encounter for other specified aftercare: Secondary | ICD-10-CM | POA: Diagnosis not present

## 2013-08-16 ENCOUNTER — Encounter (HOSPITAL_COMMUNITY)
Admission: RE | Admit: 2013-08-16 | Discharge: 2013-08-16 | Disposition: A | Discharge status: Home / Self Care | Payer: Medicare Other | Source: Ambulatory Visit | Attending: Cardiovascular Disease | Admitting: Cardiovascular Disease

## 2013-08-16 DIAGNOSIS — Z5189 Encounter for other specified aftercare: Secondary | ICD-10-CM | POA: Diagnosis not present

## 2013-08-16 LAB — GLUCOSE, CAPILLARY: Glucose-Capillary: 186 mg/dL — ABNORMAL HIGH (ref 70–99)

## 2013-08-21 ENCOUNTER — Ambulatory Visit (HOSPITAL_COMMUNITY)
Admission: RE | Admit: 2013-08-21 | Discharge: 2013-08-21 | Disposition: A | Discharge status: Home / Self Care | Payer: Medicare Other | Source: Ambulatory Visit | Attending: Cardiovascular Disease | Admitting: Cardiovascular Disease

## 2013-08-21 ENCOUNTER — Inpatient Hospital Stay (HOSPITAL_COMMUNITY)
Admission: EM | Admit: 2013-08-21 | Discharge: 2013-08-24 | DRG: 308 | Disposition: A | Discharge status: Home / Self Care | Payer: Medicare Other | Attending: Cardiovascular Disease | Admitting: Cardiovascular Disease

## 2013-08-21 ENCOUNTER — Encounter (HOSPITAL_COMMUNITY)
Admission: RE | Admit: 2013-08-21 | Discharge: 2013-08-21 | Disposition: A | Discharge status: Home / Self Care | Payer: Medicare Other | Source: Ambulatory Visit | Attending: Cardiovascular Disease | Admitting: Cardiovascular Disease

## 2013-08-21 ENCOUNTER — Emergency Department (HOSPITAL_COMMUNITY): Payer: Medicare Other

## 2013-08-21 ENCOUNTER — Encounter (HOSPITAL_COMMUNITY): Payer: Self-pay | Admitting: Emergency Medicine

## 2013-08-21 DIAGNOSIS — Z6827 Body mass index (BMI) 27.0-27.9, adult: Secondary | ICD-10-CM

## 2013-08-21 DIAGNOSIS — N184 Chronic kidney disease, stage 4 (severe): Secondary | ICD-10-CM

## 2013-08-21 DIAGNOSIS — I483 Typical atrial flutter: Secondary | ICD-10-CM

## 2013-08-21 DIAGNOSIS — I484 Atypical atrial flutter: Secondary | ICD-10-CM | POA: Diagnosis present

## 2013-08-21 DIAGNOSIS — F3289 Other specified depressive episodes: Secondary | ICD-10-CM | POA: Diagnosis present

## 2013-08-21 DIAGNOSIS — E785 Hyperlipidemia, unspecified: Secondary | ICD-10-CM | POA: Diagnosis present

## 2013-08-21 DIAGNOSIS — N182 Chronic kidney disease, stage 2 (mild): Secondary | ICD-10-CM | POA: Diagnosis present

## 2013-08-21 DIAGNOSIS — I4892 Unspecified atrial flutter: Principal | ICD-10-CM

## 2013-08-21 DIAGNOSIS — K219 Gastro-esophageal reflux disease without esophagitis: Secondary | ICD-10-CM | POA: Diagnosis present

## 2013-08-21 DIAGNOSIS — Z7901 Long term (current) use of anticoagulants: Secondary | ICD-10-CM

## 2013-08-21 DIAGNOSIS — I43 Cardiomyopathy in diseases classified elsewhere: Secondary | ICD-10-CM | POA: Diagnosis present

## 2013-08-21 DIAGNOSIS — F329 Major depressive disorder, single episode, unspecified: Secondary | ICD-10-CM | POA: Diagnosis present

## 2013-08-21 DIAGNOSIS — Z85828 Personal history of other malignant neoplasm of skin: Secondary | ICD-10-CM

## 2013-08-21 DIAGNOSIS — E782 Mixed hyperlipidemia: Secondary | ICD-10-CM | POA: Diagnosis present

## 2013-08-21 DIAGNOSIS — G4733 Obstructive sleep apnea (adult) (pediatric): Secondary | ICD-10-CM | POA: Diagnosis present

## 2013-08-21 DIAGNOSIS — I2510 Atherosclerotic heart disease of native coronary artery without angina pectoris: Secondary | ICD-10-CM | POA: Diagnosis present

## 2013-08-21 DIAGNOSIS — Z794 Long term (current) use of insulin: Secondary | ICD-10-CM | POA: Diagnosis present

## 2013-08-21 DIAGNOSIS — Q2111 Secundum atrial septal defect: Secondary | ICD-10-CM

## 2013-08-21 DIAGNOSIS — N189 Chronic kidney disease, unspecified: Secondary | ICD-10-CM

## 2013-08-21 DIAGNOSIS — Q211 Atrial septal defect: Secondary | ICD-10-CM

## 2013-08-21 DIAGNOSIS — Z8249 Family history of ischemic heart disease and other diseases of the circulatory system: Secondary | ICD-10-CM

## 2013-08-21 DIAGNOSIS — I509 Heart failure, unspecified: Secondary | ICD-10-CM

## 2013-08-21 DIAGNOSIS — F411 Generalized anxiety disorder: Secondary | ICD-10-CM | POA: Diagnosis present

## 2013-08-21 DIAGNOSIS — M1990 Unspecified osteoarthritis, unspecified site: Secondary | ICD-10-CM | POA: Diagnosis present

## 2013-08-21 DIAGNOSIS — I119 Hypertensive heart disease without heart failure: Secondary | ICD-10-CM | POA: Diagnosis present

## 2013-08-21 DIAGNOSIS — E119 Type 2 diabetes mellitus without complications: Secondary | ICD-10-CM | POA: Diagnosis present

## 2013-08-21 DIAGNOSIS — Z951 Presence of aortocoronary bypass graft: Secondary | ICD-10-CM

## 2013-08-21 DIAGNOSIS — I130 Hypertensive heart and chronic kidney disease with heart failure and stage 1 through stage 4 chronic kidney disease, or unspecified chronic kidney disease: Secondary | ICD-10-CM | POA: Diagnosis present

## 2013-08-21 DIAGNOSIS — Z91018 Allergy to other foods: Secondary | ICD-10-CM

## 2013-08-21 DIAGNOSIS — I5032 Chronic diastolic (congestive) heart failure: Secondary | ICD-10-CM | POA: Diagnosis present

## 2013-08-21 DIAGNOSIS — I5033 Acute on chronic diastolic (congestive) heart failure: Secondary | ICD-10-CM

## 2013-08-21 DIAGNOSIS — R0602 Shortness of breath: Secondary | ICD-10-CM | POA: Diagnosis not present

## 2013-08-21 DIAGNOSIS — I498 Other specified cardiac arrhythmias: Secondary | ICD-10-CM | POA: Diagnosis not present

## 2013-08-21 DIAGNOSIS — Z87891 Personal history of nicotine dependence: Secondary | ICD-10-CM

## 2013-08-21 DIAGNOSIS — E1129 Type 2 diabetes mellitus with other diabetic kidney complication: Secondary | ICD-10-CM

## 2013-08-21 DIAGNOSIS — I440 Atrioventricular block, first degree: Secondary | ICD-10-CM | POA: Diagnosis not present

## 2013-08-21 DIAGNOSIS — E1169 Type 2 diabetes mellitus with other specified complication: Secondary | ICD-10-CM | POA: Diagnosis present

## 2013-08-21 DIAGNOSIS — I4891 Unspecified atrial fibrillation: Secondary | ICD-10-CM | POA: Diagnosis present

## 2013-08-21 DIAGNOSIS — Z833 Family history of diabetes mellitus: Secondary | ICD-10-CM

## 2013-08-21 DIAGNOSIS — I110 Hypertensive heart disease with heart failure: Secondary | ICD-10-CM

## 2013-08-21 DIAGNOSIS — I480 Paroxysmal atrial fibrillation: Secondary | ICD-10-CM | POA: Diagnosis present

## 2013-08-21 DIAGNOSIS — I421 Obstructive hypertrophic cardiomyopathy: Secondary | ICD-10-CM | POA: Diagnosis present

## 2013-08-21 DIAGNOSIS — Z800 Family history of malignant neoplasm of digestive organs: Secondary | ICD-10-CM

## 2013-08-21 DIAGNOSIS — E1122 Type 2 diabetes mellitus with diabetic chronic kidney disease: Secondary | ICD-10-CM | POA: Diagnosis present

## 2013-08-21 LAB — TSH: TSH: 1.93 u[IU]/mL (ref 0.350–4.500)

## 2013-08-21 LAB — TROPONIN I: Troponin I: <0.3 ng/mL

## 2013-08-21 LAB — CBC WITH DIFFERENTIAL/PLATELET
BASOS PCT: 0 % (ref 0–1)
Basophils Absolute: 0 10*3/uL (ref 0.0–0.1)
EOS PCT: 1 % (ref 0–5)
Eosinophils Absolute: 0.1 10*3/uL (ref 0.0–0.7)
HCT: 37.9 % — ABNORMAL LOW (ref 39.0–52.0)
HEMOGLOBIN: 12.5 g/dL — AB (ref 13.0–17.0)
LYMPHS ABS: 0.9 10*3/uL (ref 0.7–4.0)
Lymphocytes Relative: 11 % — ABNORMAL LOW (ref 12–46)
MCH: 30.5 pg (ref 26.0–34.0)
MCHC: 33 g/dL (ref 30.0–36.0)
MCV: 92.4 fL (ref 78.0–100.0)
MONO ABS: 0.5 10*3/uL (ref 0.1–1.0)
MONOS PCT: 6 % (ref 3–12)
NEUTROS PCT: 82 % — AB (ref 43–77)
Neutro Abs: 6.7 10*3/uL (ref 1.7–7.7)
Platelets: 213 10*3/uL (ref 150–400)
RBC: 4.1 MIL/uL — AB (ref 4.22–5.81)
RDW: 16.7 % — ABNORMAL HIGH (ref 11.5–15.5)
WBC: 8.1 10*3/uL (ref 4.0–10.5)

## 2013-08-21 LAB — BASIC METABOLIC PANEL WITH GFR
Anion gap: 12 (ref 5–15)
BUN: 19 mg/dL (ref 6–23)
CO2: 29 meq/L (ref 19–32)
Calcium: 9.4 mg/dL (ref 8.4–10.5)
Chloride: 105 meq/L (ref 96–112)
Creatinine, Ser: 0.92 mg/dL (ref 0.50–1.35)
GFR calc Af Amer: >90 mL/min
GFR calc non Af Amer: 81 mL/min — ABNORMAL LOW
Glucose, Bld: 187 mg/dL — ABNORMAL HIGH (ref 70–99)
Potassium: 4.1 meq/L (ref 3.7–5.3)
Sodium: 146 meq/L (ref 137–147)

## 2013-08-21 LAB — PRO B NATRIURETIC PEPTIDE: Pro B Natriuretic peptide (BNP): 902.6 pg/mL — ABNORMAL HIGH (ref 0–125)

## 2013-08-21 LAB — PROTIME-INR
INR: 1.57 — ABNORMAL HIGH (ref 0.00–1.49)
Prothrombin Time: 18.8 seconds — ABNORMAL HIGH (ref 11.6–15.2)

## 2013-08-21 LAB — MAGNESIUM: Magnesium: 2.2 mg/dL (ref 1.5–2.5)

## 2013-08-21 LAB — GLUCOSE, CAPILLARY: Glucose-Capillary: 128 mg/dL — ABNORMAL HIGH (ref 70–99)

## 2013-08-21 MED ORDER — SERTRALINE HCL 100 MG PO TABS
100.0000 mg | ORAL_TABLET | Freq: Every day | ORAL | Status: DC
  Administered 2013-08-22 – 2013-08-24 (×3): 100 mg via ORAL
  Filled 2013-08-21 (×3): qty 1

## 2013-08-21 MED ORDER — MINOXIDIL 10 MG PO TABS
10.0000 mg | ORAL_TABLET | Freq: Every day | ORAL | Status: DC
  Administered 2013-08-21 – 2013-08-24 (×4): 10 mg via ORAL
  Filled 2013-08-21 (×4): qty 1

## 2013-08-21 MED ORDER — ATORVASTATIN CALCIUM 20 MG PO TABS
20.0000 mg | ORAL_TABLET | ORAL | Status: DC
  Administered 2013-08-21 – 2013-08-23 (×2): 20 mg via ORAL
  Filled 2013-08-21 (×4): qty 1

## 2013-08-21 MED ORDER — FAMOTIDINE 20 MG PO TABS
20.0000 mg | ORAL_TABLET | Freq: Every day | ORAL | Status: DC | PRN
  Filled 2013-08-21: qty 1

## 2013-08-21 MED ORDER — POTASSIUM CHLORIDE CRYS ER 20 MEQ PO TBCR
20.0000 meq | EXTENDED_RELEASE_TABLET | Freq: Three times a day (TID) | ORAL | Status: DC
  Administered 2013-08-21 – 2013-08-24 (×8): 20 meq via ORAL
  Filled 2013-08-21 (×11): qty 1

## 2013-08-21 MED ORDER — ACETAMINOPHEN 500 MG PO TABS
1000.0000 mg | ORAL_TABLET | Freq: Four times a day (QID) | ORAL | Status: DC | PRN
  Administered 2013-08-21 – 2013-08-23 (×4): 1000 mg via ORAL
  Filled 2013-08-21 (×4): qty 2

## 2013-08-21 MED ORDER — HYDROCODONE-ACETAMINOPHEN 5-325 MG PO TABS
1.0000 | ORAL_TABLET | Freq: Four times a day (QID) | ORAL | Status: DC | PRN
  Administered 2013-08-22: 1 via ORAL
  Filled 2013-08-21: qty 1

## 2013-08-21 MED ORDER — GLIMEPIRIDE 2 MG PO TABS
2.0000 mg | ORAL_TABLET | Freq: Every day | ORAL | Status: DC
  Administered 2013-08-22 – 2013-08-24 (×2): 2 mg via ORAL
  Filled 2013-08-21 (×4): qty 1

## 2013-08-21 MED ORDER — SODIUM CHLORIDE 0.9 % IJ SOLN
3.0000 mL | Freq: Two times a day (BID) | INTRAMUSCULAR | Status: DC
  Administered 2013-08-21 – 2013-08-23 (×5): 3 mL via INTRAVENOUS

## 2013-08-21 MED ORDER — WARFARIN - PHARMACIST DOSING INPATIENT
Freq: Every day | Status: DC
  Administered 2013-08-22 – 2013-08-23 (×2)

## 2013-08-21 MED ORDER — FUROSEMIDE 10 MG/ML IJ SOLN
40.0000 mg | Freq: Once | INTRAMUSCULAR | Status: AC
  Administered 2013-08-21: 40 mg via INTRAVENOUS
  Filled 2013-08-21: qty 4

## 2013-08-21 MED ORDER — ONDANSETRON HCL 4 MG/2ML IJ SOLN
4.0000 mg | Freq: Four times a day (QID) | INTRAMUSCULAR | Status: DC | PRN
Start: 2013-08-21 — End: 2013-08-24

## 2013-08-21 MED ORDER — METOPROLOL TARTRATE 25 MG PO TABS
25.0000 mg | ORAL_TABLET | Freq: Two times a day (BID) | ORAL | Status: DC
  Administered 2013-08-21 – 2013-08-24 (×6): 25 mg via ORAL
  Filled 2013-08-21 (×7): qty 1

## 2013-08-21 MED ORDER — DIPHENHYDRAMINE HCL 25 MG PO TABS
25.0000 mg | ORAL_TABLET | Freq: Four times a day (QID) | ORAL | Status: DC | PRN
  Filled 2013-08-21: qty 1

## 2013-08-21 MED ORDER — SODIUM CHLORIDE 0.9 % IV SOLN
250.0000 mL | INTRAVENOUS | Status: DC | PRN
  Administered 2013-08-23: 500 mL via INTRAVENOUS

## 2013-08-21 MED ORDER — DILTIAZEM HCL 100 MG IV SOLR
5.0000 mg/h | INTRAVENOUS | Status: DC
  Administered 2013-08-21: 5 mg/h via INTRAVENOUS
  Filled 2013-08-21 (×2): qty 100

## 2013-08-21 MED ORDER — WARFARIN SODIUM 10 MG PO TABS
10.0000 mg | ORAL_TABLET | Freq: Once | ORAL | Status: AC
  Administered 2013-08-21: 10 mg via ORAL
  Filled 2013-08-21 (×2): qty 1

## 2013-08-21 MED ORDER — ALPRAZOLAM 0.25 MG PO TABS
0.2500 mg | ORAL_TABLET | Freq: Every evening | ORAL | Status: DC | PRN
  Administered 2013-08-23 (×2): 0.25 mg via ORAL
  Filled 2013-08-21 (×2): qty 1

## 2013-08-21 MED ORDER — SODIUM CHLORIDE 0.9 % IJ SOLN: 3.0000 mL | INTRAMUSCULAR | Status: DC | PRN

## 2013-08-21 MED ORDER — IRBESARTAN 150 MG PO TABS
150.0000 mg | ORAL_TABLET | Freq: Every day | ORAL | Status: DC
  Administered 2013-08-22 – 2013-08-24 (×3): 150 mg via ORAL
  Filled 2013-08-21 (×4): qty 1

## 2013-08-21 MED ORDER — CLONIDINE HCL 0.1 MG PO TABS
0.1000 mg | ORAL_TABLET | Freq: Two times a day (BID) | ORAL | Status: DC
  Administered 2013-08-21 – 2013-08-24 (×6): 0.1 mg via ORAL
  Filled 2013-08-21 (×7): qty 1

## 2013-08-21 MED ORDER — DILTIAZEM LOAD VIA INFUSION
15.0000 mg | Freq: Once | INTRAVENOUS | Status: AC
  Administered 2013-08-21: 15 mg via INTRAVENOUS
  Filled 2013-08-21: qty 15

## 2013-08-21 MED ORDER — INSULIN ASPART 100 UNIT/ML ~~LOC~~ SOLN
0.0000 [IU] | Freq: Three times a day (TID) | SUBCUTANEOUS | Status: DC
  Administered 2013-08-22: 1 [IU] via SUBCUTANEOUS
  Administered 2013-08-22: 2 [IU] via SUBCUTANEOUS
  Administered 2013-08-23: 5 [IU] via SUBCUTANEOUS
  Administered 2013-08-24: 1 [IU] via SUBCUTANEOUS

## 2013-08-21 NOTE — ED Notes (Signed)
PT noted to be in NSR. Repeat EKG performed

## 2013-08-21 NOTE — ED Notes (Signed)
Dr. Campos at bedside   

## 2013-08-21 NOTE — ED Provider Notes (Addendum)
CSN: [id number]     Arrival date & time 08/21/13  1221 History   First MD Initiated Contact with Patient 08/21/13 1224     Chief Complaint  Patient presents with  . Irregular Heart Beat     HPI Patient with increasing exertional shortness of breath over the past week.  He denies fevers and chills.  No productive cough.  Denies orthopnea.  Denies unilateral leg swelling.  Denies history of congestive heart failure.  He is status post CABG.  He's had atrial fibrillation before in the past.  He was found to be in atrial flutter at cardiac rehabilitation today and was sent to the ER for evaluation.  He denies palpitations.  He is on Coumadin.  He previously was on amiodarone and this was recently stopped.   Past Medical History  Diagnosis Date  . Hx of adenomatous colonic polyps   . Diabetes mellitus type II   . Hypertension   . Diverticulosis 2001  . Hyperlipidemia   . Persistent atrial fibrillation     a. on amio and coumadin;  b. s/p MAZE 04/2013 in setting of CABG.  . Obstructive sleep apnea     compliant with CPAP  . Chronic diastolic congestive heart failure   . Hypertensive cardiomyopathy   . History of cardioversion   . H/O hiatal hernia   . Cancer     Basal cell carcinoma  . Adrenal adenoma   . CAD (coronary artery disease)     a. 04/2013 CABG x 2: LIMA to LAD, SVG to RI, EVH via R thigh.  . S/P Maze operation for atrial fibrillation     a. 04/2013: Complete bilateral atrial lesion set using cryothermy and bipolar radiofrequency ablation with clipping of LA appendage (@ time of CABG)  . Type II or unspecified type diabetes mellitus without mention of complication, not stated as uncontrolled   . GERD (gastroesophageal reflux disease)   . Depression   . Anxiety   . DJD (degenerative joint disease)    Past Surgical History  Procedure Laterality Date  . Polinydal cyst      Removed  . Great toe arthrodesis, interphalangeal joint      Right foot  . Retina repair-right    .  Cataract extraction      bilateral  . Basal cell carcinoma excision      x3 on face  . Polypectomy    . Cardiac catheterization      myocardial bridge but no cad  . Coronary artery bypass graft N/A 04/05/2013    Procedure: CORONARY ARTERY BYPASS GRAFTING (CABG) TIMES TWO USING LEFT INTERNAL MAMMARY ARTERY AND RIGHT SAPHENOUS LEG VEIN HARVESTED ENDOSCOPICALLY;  Surgeon: Rexene Alberts, MD;  Location: [facility];  Service: Open Heart Surgery;  Laterality: N/A;  . Maze N/A 04/05/2013    Procedure: MAZE;  Surgeon: Rexene Alberts, MD;  Location: [place];  Service: Open Heart Surgery;  Laterality: N/A;  . Intraoperative transesophageal echocardiogram N/A 04/05/2013    Procedure: INTRAOPERATIVE TRANSESOPHAGEAL ECHOCARDIOGRAM;  Surgeon: Rexene Alberts, MD;  Location: [facility];  Service: Open Heart Surgery;  Laterality: N/A;   Family History  Problem Relation Age of Onset  . Colon cancer Mother     Family History/Uncle   . Colon polyps Mother     Family History  . Atrial fibrillation Mother   . Hypertension Mother   . Colon polyps Sister     Family history  . Diabetes Maternal Uncle   .  Stroke Paternal Uncle   . Dementia Father    History  Substance Use Topics  . Smoking status: Former Smoker -- 4.00 packs/day    Types: Cigarettes    Quit date: 01/05/1981  . Smokeless tobacco: Never Used     Comment: Stopped 1983  . Alcohol Use: Yes     Comment: 1-5 drinks per week    Review of Systems  All other systems reviewed and are negative.     Allergies  Horse-derived products; Other; and Sunflower seed  Home Medications   Prior to Admission medications   Medication Sig Start Date End Date Taking? Authorizing Provider  acetaminophen (TYLENOL) 500 MG tablet Take 1,000 mg by mouth every 6 (six) hours as needed for mild pain.    Yes Historical Provider, MD  ALPRAZolam Duanne Moron) 0.25 MG tablet Take 0.25 mg by mouth at bedtime as needed for sleep.    Yes Historical Provider, MD  amLODipine (NORVASC)  10 MG tablet Take 5 mg by mouth 2 (two) times daily. 06/03/13  Yes Historical Provider, MD  atorvastatin (LIPITOR) 20 MG tablet Take 20 mg by mouth 3 (three) times a week. Monday Wednesday and Friday   Yes Historical Provider, MD  BESIVANCE 0.6 % SUSP Place 1 drop into the left eye 4 (four) times daily as needed (after injection).  11/10/12  Yes Historical Provider, MD  cloNIDine (CATAPRES) 0.2 MG tablet Take 0.1 mg by mouth 2 (two) times daily.    Yes Historical Provider, MD  diphenhydrAMINE (BENADRYL) 25 MG tablet Take 25 mg by mouth every 6 (six) hours as needed for allergies.    Yes Historical Provider, MD  furosemide (LASIX) 40 MG tablet Take 40 mg by mouth 2 (two) times daily.  06/07/13  Yes Rexene Alberts, MD  glimepiride (AMARYL) 2 MG tablet Take 2 mg by mouth daily with breakfast.   Yes Historical Provider, MD  HYDROcodone-acetaminophen (VICODIN) 5-500 MG per tablet Take 1 tablet by mouth every 6 (six) hours as needed for pain.   Yes Historical Provider, MD  KLOR-CON M20 20 MEQ tablet Take 20 mEq by mouth 3 (three) times daily.  07/11/13  Yes Historical Provider, MD  metFORMIN (GLUCOPHAGE-XR) 500 MG 24 hr tablet Take 1,000 mg by mouth 2 (two) times daily before a meal.  07/17/13  Yes Historical Provider, MD  metoprolol tartrate (LOPRESSOR) 25 MG tablet Take 12.5 mg by mouth 2 (two) times daily.   Yes Historical Provider, MD  minoxidil (LONITEN) 10 MG tablet Take 10 mg by mouth daily.   Yes Historical Provider, MD  ranitidine (ZANTAC) 150 MG tablet Take 150 mg by mouth daily as needed for heartburn.    Yes Historical Provider, MD  sertraline (ZOLOFT) 100 MG tablet Take 100 mg by mouth daily.   Yes Historical Provider, MD  valsartan (DIOVAN) 320 MG tablet Take 160 mg by mouth daily.   Yes Historical Provider, MD  warfarin (COUMADIN) 5 MG tablet Take 7.5-10 mg by mouth daily. Take 7.5 mg daily except take 10 mg on sundays   Yes Historical Provider, MD   BP 156/76  Pulse 65  Resp 27  SpO2  96% Physical Exam  Nursing note and vitals reviewed. Constitutional: He is oriented to person, place, and time. He appears well-developed and well-nourished.  HENT:  Head: Normocephalic and atraumatic.  Eyes: EOM are normal.  Neck: Normal range of motion.  Cardiovascular: Intact distal pulses.   Irregular heart rhythm  Pulmonary/Chest: Effort normal and breath sounds normal.  No respiratory distress.  Abdominal: Soft. He exhibits no distension. There is no tenderness.  Musculoskeletal: Normal range of motion.  Neurological: He is alert and oriented to person, place, and time.  Skin: Skin is warm and dry.  Psychiatric: He has a normal mood and affect. Judgment normal.    ED Course  Procedures (including critical care time)  CRITICAL CARE Performed by: Hoy Morn Total critical care time: 32 Critical care time was exclusive of separately billable procedures and treating other patients. Critical care was necessary to treat or prevent imminent or life-threatening deterioration. Critical care was time spent personally by me on the following activities: development of treatment plan with patient and/or surrogate as well as nursing, discussions with consultants, evaluation of patient's response to treatment, examination of patient, obtaining history from patient or surrogate, ordering and performing treatments and interventions, ordering and review of laboratory studies, ordering and review of radiographic studies, pulse oximetry and re-evaluation of patient's condition.   Labs Review Labs Reviewed  CBC WITH DIFFERENTIAL - Abnormal; Notable for the following:    RBC 4.10 (*)    Hemoglobin 12.5 (*)    HCT 37.9 (*)    RDW 16.7 (*)    Neutrophils Relative % 82 (*)    Lymphocytes Relative 11 (*)    All other components within normal limits  BASIC METABOLIC PANEL - Abnormal; Notable for the following:    Glucose, Bld 187 (*)    GFR calc non Af Amer 81 (*)    All other components  within normal limits  PRO B NATRIURETIC PEPTIDE - Abnormal; Notable for the following:    Pro B Natriuretic peptide (BNP) 902.6 (*)    All other components within normal limits  PROTIME-INR - Abnormal; Notable for the following:    Prothrombin Time 18.8 (*)    INR 1.57 (*)    All other components within normal limits  TROPONIN I  TSH    Imaging Review Dg Chest Portable 1 View  08/21/2013   CLINICAL DATA:  Irregular heartbeat.  EXAM: PORTABLE CHEST - 1 VIEW  COMPARISON:  05/25/2013.  FINDINGS: The heart is enlarged. There has been prior CABG. Moderate pulmonary vascular congestion appears slightly worse from priors, likely pulmonary edema. Trace RIGHT effusion. No pneumothorax.  IMPRESSION: Slight worsening aeration, probable mild pulmonary edema. Cardiomegaly.   Electronically Signed   By: Rolla Flatten M.D.   On: 08/21/2013 14:51  I personally reviewed the imaging tests through PACS system I reviewed available ER/hospitalization records through the EMR    EKG Interpretation   Date/Time:  Monday August 21 2013 12:29:26 EDT Ventricular Rate:  79 PR Interval:    QRS Duration: 110 QT Interval:  458 QTC Calculation: 525 R Axis:   105 Text Interpretation:  Atrial flutter Probable lateral infarct, age  indeterminate Prolonged QT interval changed from baseline ecg Confirmed by  Evana Runnels  MD, Lennette Bihari ([id number]) on 08/21/2013 2:01:19 PM      ECG interpretation  Date: 08/21/2013  Rate: 64  Rhythm:atrial flutter with 4:1 block  QRS Axis: normal  Intervals: normal  ST/T Wave abnormalities: normal  Conduction Disutrbances: none  Narrative Interpretation:   Old EKG Reviewed: No significant changes noted     MDM   Final diagnoses:  Typical atrial flutter    Atrial flutter noted on EKG.  Unclear how long he has been in this.  I likely think this is the cause of his shortness of breath.  Patient be started on a Cardizem drip.  Cardiology consultation.  BMP will be added as well.  Will  continue monitor the patient's cardiac arrhythmia closely now that he is on a Cardizem drip.  Low suspicion for pulmonary embolism.  Doubt aortic dissection.    Hoy Morn, MD 08/21/13 [place], MD 08/21/13 [place], MD 08/21/13 [phone]

## 2013-08-21 NOTE — Progress Notes (Signed)
[name] reports feeling increasing short of breath for past ten ten days. Telemetry rhythm appears to be irregular rate 80-100.Will obtain 12 lead EKG. Oxygen saturation 91-93% on room air. Upon assessment diminished breath sound left base. No Exercise today. Upon assessment diminished breath sounds left posterior base 12 lead show Atrial aflutter rate 76. [place] card master called and notified. Patient taken to the ED for further evaluation via stretcher on 2L/min of oxygen. Patient called his wife to notify her of today's events. Report given to ED RN.

## 2013-08-21 NOTE — ED Notes (Signed)
MD at bedside. 

## 2013-08-21 NOTE — Consult Note (Signed)
ANTICOAGULATION CONSULT NOTE - Initial Consult  Pharmacy Consult for Coumadin Indication: afib, aflutter  Allergies  Allergen Reactions  . Horse-Derived Products Other (See Comments)    Based on skin test reaction  . Other Other (See Comments)    Tetanus Shot -- skin test reaction  . Sunflower Seed [Sunflower Oil] Swelling    Tongue and lip swelling   Vital Signs: BP: 149/67 mmHg (08/17 1700) Pulse Rate: 79 (08/17 1700)  Labs:  Recent Labs  08/21/13 1241 08/21/13 1242 08/21/13 1245  HGB 12.5*  --   --   HCT 37.9*  --   --   PLT 213  --   --   LABPROT  --   --  18.8*  INR  --   --  1.57*  CREATININE 0.92  --   --   TROPONINI  --  <0.30  --     The CrCl is unknown because both a height and weight (above a minimum accepted value) are required for this calculation.   Medical History: Past Medical History  Diagnosis Date  . Hx of adenomatous colonic polyps   . Diabetes mellitus type II   . Hypertension   . Diverticulosis 2001  . Hyperlipidemia   . Persistent atrial fibrillation     a. on amio and coumadin;  b. s/p MAZE 04/2013 in setting of CABG.  . Obstructive sleep apnea     compliant with CPAP  . Chronic diastolic congestive heart failure   . Hypertensive cardiomyopathy   . History of cardioversion     x3 (years uncertain)  . H/O hiatal hernia   . Cancer     Basal cell carcinoma  . Adrenal adenoma   . CAD (coronary artery disease)     a. 04/2013 CABG x 2: LIMA to LAD, SVG to RI, EVH via R thigh.  . S/P Maze operation for atrial fibrillation     a. 04/2013: Complete bilateral atrial lesion set using cryothermy and bipolar radiofrequency ablation with clipping of LA appendage (@ time of CABG)  . Type II or unspecified type diabetes mellitus without mention of complication, not stated as uncontrolled   . GERD (gastroesophageal reflux disease)   . Depression   . Anxiety   . DJD (degenerative joint disease)   . Atrial flutter 2015   Assessment: 73yom on  coumadin pta for afib presents to the ED with new onset aflutter. INR on admission subtherapeutic at 1.57 - apparently is a chronic issue for him. May change to NOAC if patient is agreeable, but coumadin to continue for now.  Home dose: 7.5mg  daily except 10mg  on Sunday - last dose 8/16  Goal of Therapy:  INR 2-3 Monitor platelets by anticoagulation protocol: Yes   Plan:  1) Coumadin 10mg  x 1 tonight 2) Daily INR  Deboraha Sprang 08/21/2013,6:12 PM

## 2013-08-21 NOTE — H&P (Signed)
Cardiology H&P Note  Patient ID: [name], MRN: [id number], DOB/AGE: March 31, 1940 73 y.o. Admit date: 08/21/2013   Date of Consult: 08/21/2013 Primary Physician: Alesia Richards, MD Primary Cardiologist: Dr. Acie Fredrickson, MD  Chief Complaint: Increasing SOB over the past 1 week Reason for Consult: A-flutter  HPI:  73 y/o M with h/o CAD s/p CABG 04/2013, HOCM, chronic diastolic CHF, chronic atrial fib s/p MAZE 04/2013 in the setting of CABG (has failed Tikosyn and amiodarone in the past, he has also been reluctant to take [facility] 2/2 bleeding problems), aortic insufficiency, OSA on CPAP, DM2 who presents to [facility] today with new onset a-flutter with variable 2:1 and 3:1 conduction rates.   Recently seen by Dr. Rayann Heman for alternate therapies for symptomatic a-fib. It was ultimately felt he would benefit from surgical MAZE and was referred to Dr. Roxy Manns. As part of this work up he underwent cardiac cath on 03/07/13 and was found to have 50% stenosis LM, LAD calcified 50% stenosis, D1 ostial 70% stenosis, D2 50% ostial stenosis, LCx ostial 95% stenosis, RCA mild diffuse disease, EF 65-70%. It was decided at that time to proceed with MAZE, as well as obtain CBAG consideration. On 04/05/13 he underwent CABG x 2 LIMA-->LAD, SVG-->RI, and EVH. He also underwent MAZE procedure at this time. He maintained NSR until 04/11/13 when he went back into rate controlled AF. He was continued on amiodarone 400 mg bid and metoprolol 12.5 mg bid. He was discharged on 4/8 and returned back to the ED on 4/9 with SOB upon lying down. Echo without pericardial effusion. Symptoms felt to be 2/2 acute diastolic CHF/anxiety and improved with single dose of lasix. D/c'd 4/10.     Today he comes with increased SOB over the past week. While at cardiac rehab today he complained of increasing SOB and was found to be in a-flutter. At that time he was brought to the ED for further evaluation. He has done well from a cardiac standpoint since mid  April, continuing with cardiac rehab and advancing daily activity as tolerated. This morning he could not even make it back to his house from watering the plants on the deck and getting the paper from the mailbox 2/2 SOB. He denies any chest pain, palpitations, nausea, vomiting, diaphoresis, presyncope, or syncope. In the ED he received IV Diltiazem 15 mg gtt. He remained in rate controlled a-flutter. INR has been chronically subtherapeutic, currently 1.57. Ranging between 1.57 to max 1.85 one month ago, this has been a chronic problem. Pro BNP improved to 902 from 2226 four months ago. TSH nl, K+ 4.1, troponin negative x 1, EKG a-flutter variable 2:1 and 3:1 conduction, 79, no st/t changes. Telemetry with rate controlled a-flutter, HR 60s. CXR with slight worsening aeration, probable mild pulmonary edema. Cardiomegaly.             Past Medical History  Diagnosis Date  . Hx of adenomatous colonic polyps   . Diabetes mellitus type II   . Hypertension   . Diverticulosis 2001  . Hyperlipidemia   . Persistent atrial fibrillation     a. on amio and coumadin;  b. s/p MAZE 04/2013 in setting of CABG.  . Obstructive sleep apnea     compliant with CPAP  . Chronic diastolic congestive heart failure   . Hypertensive cardiomyopathy   . History of cardioversion   . H/O hiatal hernia   . Cancer     Basal cell carcinoma  . Adrenal adenoma   .  CAD (coronary artery disease)     a. 04/2013 CABG x 2: LIMA to LAD, SVG to RI, EVH via R thigh.  . S/P Maze operation for atrial fibrillation     a. 04/2013: Complete bilateral atrial lesion set using cryothermy and bipolar radiofrequency ablation with clipping of LA appendage (@ time of CABG)  . Type II or unspecified type diabetes mellitus without mention of complication, not stated as uncontrolled   . GERD (gastroesophageal reflux disease)   . Depression   . Anxiety   . DJD (degenerative joint disease)       Most Recent Cardiac Studies: Cardiac Catheterization  Operative Report  [name]  [id number]  3/3/20158:57 AM  MCKEOWN,WILLIAM DAVID, MD  Procedure Performed:  1. Left Heart Catheterization 2. Selective Coronary Angiography 3. Left ventricular angiogram 4. Fractional flow reserve of the LAD Operator: Lauree Chandler, MD  Arterial access site: Right radial artery.  Indication: 73 yo male with history of atrial fibrillation, hypertension with hypertensive cardiomyopathy and chronic diastolic congestive heart failure, type 2 diabetes mellitus, hyperlipidemia, and remote history of tobacco use with plans for MAZE procedure per Dr. Roxy Manns. Cardiac cath today to exclude obstructive CAD.  Procedure Details:  The risks, benefits, complications, treatment options, and expected outcomes were discussed with the patient. The patient and/or family concurred with the proposed plan, giving informed consent. The patient was brought to the cath lab after IV hydration was begun and oral premedication was given. The patient was further sedated with Versed and Fentanyl. The right wrist was assessed with an Allens test which was positive. The right wrist was prepped and draped in a sterile fashion. 1% lidocaine was used for local anesthesia. Using the modified Seldinger access technique, a 5 French sheath was placed in the right radial artery. 3 mg Verapamil was given through the sheath. 5000 units IV heparin was given. Standard diagnostic catheters were used to perform selective coronary angiography. A pigtail catheter was used to perform a left ventricular angiogram. He was found to have stenosis involving the distal left main with severe stenosis ostial and Circumflex and moderate stenosis ostial LAD. I elected to perform FFR of the LAD.  The patient was given a total of 10,000 units IV heparin. ACT was over 195 and the left main was engaged with a XB LAD 3.5 guiding catheter. I then passed a Volcano pressure wire into the LAD. Baseline FFR was 0.91-0.93. With  infusion of IV adenosine for 2 minutes, FFR was 0.93 suggesting the ostial LAD stenosis was not severe.  The sheath was removed from the right radial artery and a Terumo hemostasis band was applied at the arteriotomy site on the right wrist. There were no immediate complications. The patient was taken to the recovery area in stable condition.  Hemodynamic Findings:  Central aortic pressure: 110/61  Left ventricular pressure: 106/9/20  Angiographic Findings:  Left main: 50% distal stenosis.  Left Anterior Descending Artery: Large caliber vessel that does not reach the apex. The ostium of the vessel has a hazy, calcified 50% stenosis. This is best seen in the LAD/caudal views. The mid and distal vessel has no obstructive disease. There are two moderate caliber diagonal branches. The first diagonal branch has ostial 70% stenosis. The second diagonal branch appears to have at least 50% ostial stenosis.  Circumflex Artery: Moderate caliber vessel with intermediate branch and several very small caliber obtuse marginal branches. The ostium of the circumflex has a hazy 95% stenosis. The ostium of the intermediate  branch has 95% stenosis.  Right Coronary Artery: Very large caliber dominant vessel with mild diffuse disease.  Left Ventricular Angiogram: LVEF=65-70%.  Impression:  1. 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 FFR of 0.93 in the LAD suggesting flow into the LAD is reduced but not in a severe range at this time.  2. Mild non-obstructive disease in the RCA  3. Normal LV systolic function  4. Atrial fibrillation  Recommendations: The stenosis in the distal left main involving the LAD and Circumflex/intermediate appears to be significant. Although the FFR of the LAD is 0.93, the stenosis involving the distal left main is concerning. He will need consideration for CABG at the time of his Maze procedure.  Complications: None. The patient  tolerated the procedure well.     Echo 04/13/13:  [facility]* [address] [place], [place] [address] [phone]  ------------------------------------------------------------ Transthoracic Echocardiography  Patient: [name], [name] MR #: [id number] Study Date: 04/13/2013 Gender: M Age: 39 Height: 180.3cm Weight: 91.2kg BSA: 2.20m^2 Pt. Status: Room: 2W10C  ADMITTING Martinique, Peter ATTENDING Martinique, Peter ORDERING Hermann, Daniel REFERRING Hermann, Daniel SONOGRAPHER Donata Clay PERFORMING [facility], Inpatient cc:  ------------------------------------------------------------ LV EF: 55% - 60%  ------------------------------------------------------------ Indications: Shortness of breath 786.05.  ------------------------------------------------------------ History: PMH: Pericardial Rub. Atrial fibrillation. Coronary artery disease. Congestive heart failure. Risk factors: Hypertension. Diabetes mellitus. Dyslipidemia.  ------------------------------------------------------------ Study Conclusions  - Left ventricle: The cavity size was normal. Wall thickness was increased in a pattern of severe LVH. Systolic function was normal. The estimated ejection fraction was in the range of 55% to 60%. Wall motion was normal; there were no regional wall motion abnormalities. - Right atrium: The atrium was mildly dilated. - Atrial septum: No subcostal images done - Pericardium, extracardiac: NO pericardial effusion seen on apical or parasternal views No subcostal views done      Surgical History:  Past Surgical History  Procedure Laterality Date  . Polinydal cyst      Removed  . Great toe arthrodesis, interphalangeal joint      Right foot  . Retina repair-right    . Cataract extraction      bilateral  . Basal cell carcinoma excision      x3 on face  . Polypectomy    . Cardiac catheterization      myocardial bridge but no cad  .  Coronary artery bypass graft N/A 04/05/2013    Procedure: CORONARY ARTERY BYPASS GRAFTING (CABG) TIMES TWO USING LEFT INTERNAL MAMMARY ARTERY AND RIGHT SAPHENOUS LEG VEIN HARVESTED ENDOSCOPICALLY;  Surgeon: Rexene Alberts, MD;  Location: [place];  Service: Open Heart Surgery;  Laterality: N/A;  . Maze N/A 04/05/2013    Procedure: MAZE;  Surgeon: Rexene Alberts, MD;  Location: [place];  Service: Open Heart Surgery;  Laterality: N/A;  . Intraoperative transesophageal echocardiogram N/A 04/05/2013    Procedure: INTRAOPERATIVE TRANSESOPHAGEAL ECHOCARDIOGRAM;  Surgeon: Rexene Alberts, MD;  Location: [place];  Service: Open Heart Surgery;  Laterality: N/A;     Home Meds: Prior to Admission medications   Medication Sig Start Date End Date Taking? Authorizing Provider  acetaminophen (TYLENOL) 500 MG tablet Take 1,000 mg by mouth every 6 (six) hours as needed for mild pain.    Yes Historical Provider, MD  ALPRAZolam Duanne Moron) 0.25 MG tablet Take 0.25 mg by mouth at bedtime as needed for sleep.    Yes Historical Provider, MD  amLODipine (NORVASC) 10 MG tablet  Take 5 mg by mouth 2 (two) times daily. 06/03/13  Yes Historical Provider, MD  atorvastatin (LIPITOR) 20 MG tablet Take 20 mg by mouth 3 (three) times a week. Monday Wednesday and Friday   Yes Historical Provider, MD  BESIVANCE 0.6 % SUSP Place 1 drop into the left eye 4 (four) times daily as needed (after injection).  11/10/12  Yes Historical Provider, MD  cloNIDine (CATAPRES) 0.2 MG tablet Take 0.1 mg by mouth 2 (two) times daily.    Yes Historical Provider, MD  diphenhydrAMINE (BENADRYL) 25 MG tablet Take 25 mg by mouth every 6 (six) hours as needed for allergies.    Yes Historical Provider, MD  furosemide (LASIX) 40 MG tablet Take 40 mg by mouth 2 (two) times daily.  06/07/13  Yes Rexene Alberts, MD  glimepiride (AMARYL) 2 MG tablet Take 2 mg by mouth daily with breakfast.   Yes Historical Provider, MD  HYDROcodone-acetaminophen (VICODIN) 5-500 MG per tablet  Take 1 tablet by mouth every 6 (six) hours as needed for pain.   Yes Historical Provider, MD  KLOR-CON M20 20 MEQ tablet Take 20 mEq by mouth 3 (three) times daily.  07/11/13  Yes Historical Provider, MD  metFORMIN (GLUCOPHAGE-XR) 500 MG 24 hr tablet Take 1,000 mg by mouth 2 (two) times daily before a meal.  07/17/13  Yes Historical Provider, MD  metoprolol tartrate (LOPRESSOR) 25 MG tablet Take 12.5 mg by mouth 2 (two) times daily.   Yes Historical Provider, MD  minoxidil (LONITEN) 10 MG tablet Take 10 mg by mouth daily.   Yes Historical Provider, MD  ranitidine (ZANTAC) 150 MG tablet Take 150 mg by mouth daily as needed for heartburn.    Yes Historical Provider, MD  sertraline (ZOLOFT) 100 MG tablet Take 100 mg by mouth daily.   Yes Historical Provider, MD  valsartan (DIOVAN) 320 MG tablet Take 160 mg by mouth daily.   Yes Historical Provider, MD  warfarin (COUMADIN) 5 MG tablet Take 7.5-10 mg by mouth daily. Take 7.5 mg daily except take 10 mg on sundays   Yes Historical Provider, MD    Inpatient Medications:    . diltiazem (CARDIZEM) infusion 5 mg/hr (08/21/13 1321)    Allergies:  Allergies  Allergen Reactions  . Horse-Derived Products Other (See Comments)    Based on skin test reaction  . Other Other (See Comments)    Tetanus Shot -- skin test reaction  . Sunflower Seed [Sunflower Oil] Swelling    Tongue and lip swelling    History   Social History  . Marital Status: Married    Spouse Name: N/A    Number of Children: 1  . Years of Education: N/A   Occupational History  . retired Software engineer    Social History Main Topics  . Smoking status: Former Smoker -- 4.00 packs/day    Types: Cigarettes    Quit date: 01/05/1981  . Smokeless tobacco: Never Used     Comment: Stopped 1983  . Alcohol Use: Yes     Comment: 1-5 drinks per week  . Drug Use: No  . Sexual Activity: Not on file   Other Topics Concern  . Not on file   Social History Narrative   Daily caffeine-yes    Patient gets regular exercise.   Pt lives in [place] with spouse.  Retired Software engineer.  Family History  Problem Relation Age of Onset  . Colon cancer Mother     Family History/Uncle   . Colon polyps Mother     Family History  . Atrial fibrillation Mother   . Hypertension Mother   . Colon polyps Sister     Family history  . Diabetes Maternal Uncle   . Stroke Paternal Uncle   . Dementia Father      Review of Systems: General: negative for chills, fever, night sweats or weight changes.  Cardiovascular: negative for chest pain, edema, orthopnea, palpitations, or paroxysmal nocturnal dyspnea Dermatological: negative for rash Respiratory: negative for cough or wheezing Urologic: negative for hematuria Abdominal: negative for nausea, vomiting, diarrhea, bright red blood per rectum, melena, or hematemesis Neurologic:  negative for visual changes, syncope, or dizziness All other systems reviewed and are otherwise negative except as noted above.  Labs:  Recent Labs  08/21/13 1242  TROPONINI <0.30   Lab Results  Component Value Date   WBC 8.1 08/21/2013   HGB 12.5* 08/21/2013   HCT 37.9* 08/21/2013   MCV 92.4 08/21/2013   PLT 213 08/21/2013    Recent Labs Lab 08/21/13 1241  NA 146  K 4.1  CL 105  CO2 29  BUN 19  CREATININE 0.92  CALCIUM 9.4  GLUCOSE 187*   Lab Results  Component Value Date   CHOL 112 05/19/2013   HDL 31* 05/19/2013   LDLCALC 61 05/19/2013   TRIG 102 05/19/2013   No results found for this basename: DDIMER    Radiology/Studies:  No results found.  EKG: atypical a-flutter (p-wave upright in inferior lead along with v1). Variable conduction rates of 2:1/3:1, 79, prolonged QTc 525, no st/t changes  Physical Exam: Blood pressure 156/76, pulse 65, resp. rate 27, SpO2 96.00%. General: Well developed, well nourished, in no acute distress. Head: Normocephalic, atraumatic, sclera non-icteric, no xanthomas, nares are without discharge.  Neck: Negative for carotid bruits. JVD not elevated. Lungs: Clear bilaterally to auscultation without wheezes, rales, or rhonchi. Breathing is unlabored. Heart: RRR with S1 S2. No murmurs, rubs, or gallops appreciated. Abdomen: Soft, non-tender, non-distended with normoactive bowel sounds. No hepatomegaly. No rebound/guarding. No obvious abdominal masses. Msk:  Strength and tone appear normal for age. Extremities: No clubbing or cyanosis. No edema.  Distal pedal pulses are 2+ and equal bilaterally. Neuro: Alert and oriented X 3. No facial asymmetry. No focal deficit. Moves all extremities spontaneously. Psych:  Responds to questions appropriately with a normal affect.     Assessment and Plan:  73 year old male with h/o CAD s/p CABG 04/2013, HOCM, chronic diastolic CHF, chronic atrial fib s/p MAZE 04/2013 in the setting of CABG, aortic  insufficiency, OSA on CPAP, DM2 who presents to [facility] today with new onset a-flutter with variable 2:1 and 3:1 conduction rates.  1) New onset atypical a-flutter with variable conduction, currently rate controlled: -Received IV Diltiazem 15 mg in ED -May not be a good candidate for ablation 2/2 atypical presentation of a-flutter. Dr. Rayann Heman has discussed this with him regarding his a fib in 02/2013.  -INR subtherapeutic 1.57, this is a chronic issue for him. He is reluctant to consider alternative AC. Would be a good candidate for NOAC if he is amenable to taking. Nl renal function.  CHADSVASc=4 -Would continue medical therapy with IV Diltiazem 15 mg gtt. Increase Lopressor to 25 mg BID.    2) Chronic a-fib s/p MAZE 04/2013: -Remained in NSR while inpatient until 4/8 then redeveloped rate controlled  AF -It is unclear how long he remained in AF. In AF on 4/22, in NSR 5/22.  -Stopped taking Amiodarone mid July   3) Possible mild acute on chronic diastolic CHF: -? Slight volume overload on CXR -Pro BNP 902, this is down from April's 2226 -IV Lasix 40 mg daily  4) CAD s/p CABG -Aspirin 81 mg, b-blocker, Lipitor 20 mg  -No anginal symptoms  5) OSA -CPAP  6) DM2 -Stop metformin -SSI   Signed, DUNN,RYAN PA-C 08/21/2013, 2:02 PM  I have personally seen and examined this patient with Christell Faith, PA-C. I agree with the assessment and plan as outlined above. He is admitted with profound dyspnea on exertion. BNP is elevated. Examination c/w mild volume overload. He has a history of atrial fib s/p MAZE but now in atrial flutter. Rate is controlled on IV cardizem. Will admit to telemetry. Continue IV diltiazem tonight. Will likely convert to po Cardizem in am. Hold Norvasc. Increase Lopressor to 25 mg po BID. IV Lasix tonight. Reassess in am. (He takes 40 mg Lasix po daily at home). He is sub-therapeutic on coumadin and does not wish to try a NOAC again (had easy bleeding on Eliquis, not pathological  but unable to control bleeding from cuts on his fingers). Will adjust coumadin dosing. If he does not convert to sinus, will need consideration for DCCV after 4 weeks of anti-coagulation.   MCALHANY,CHRISTOPHER 08/21/2013 4:47 PM

## 2013-08-21 NOTE — ED Notes (Signed)
Attempted report 

## 2013-08-21 NOTE — ED Notes (Signed)
Care transferred, report received Tamera Punt, RN.

## 2013-08-21 NOTE — Progress Notes (Signed)
Pt has his home CPAP and put it on his self tonight.

## 2013-08-21 NOTE — ED Notes (Signed)
Pt presents from cardiac rehab with c/o irregular heartbeat. Pt is a recent CABG patient with history of Afib prior to the surgery. Pt has been in NSR since the surgery but was noted to be back in Afib in Cardiac Rehab this afternoon. Pt denies chest pain or SOB.

## 2013-08-22 DIAGNOSIS — Z91018 Allergy to other foods: Secondary | ICD-10-CM | POA: Diagnosis not present

## 2013-08-22 DIAGNOSIS — E785 Hyperlipidemia, unspecified: Secondary | ICD-10-CM | POA: Diagnosis present

## 2013-08-22 DIAGNOSIS — I2510 Atherosclerotic heart disease of native coronary artery without angina pectoris: Secondary | ICD-10-CM | POA: Diagnosis present

## 2013-08-22 DIAGNOSIS — F3289 Other specified depressive episodes: Secondary | ICD-10-CM | POA: Diagnosis present

## 2013-08-22 DIAGNOSIS — I421 Obstructive hypertrophic cardiomyopathy: Secondary | ICD-10-CM | POA: Diagnosis present

## 2013-08-22 DIAGNOSIS — I110 Hypertensive heart disease with heart failure: Secondary | ICD-10-CM

## 2013-08-22 DIAGNOSIS — F411 Generalized anxiety disorder: Secondary | ICD-10-CM | POA: Diagnosis present

## 2013-08-22 DIAGNOSIS — Q2111 Secundum atrial septal defect: Secondary | ICD-10-CM | POA: Diagnosis not present

## 2013-08-22 DIAGNOSIS — I4891 Unspecified atrial fibrillation: Secondary | ICD-10-CM | POA: Diagnosis present

## 2013-08-22 DIAGNOSIS — Z87891 Personal history of nicotine dependence: Secondary | ICD-10-CM | POA: Diagnosis not present

## 2013-08-22 DIAGNOSIS — Z6827 Body mass index (BMI) 27.0-27.9, adult: Secondary | ICD-10-CM | POA: Diagnosis not present

## 2013-08-22 DIAGNOSIS — Z951 Presence of aortocoronary bypass graft: Secondary | ICD-10-CM

## 2013-08-22 DIAGNOSIS — F329 Major depressive disorder, single episode, unspecified: Secondary | ICD-10-CM | POA: Diagnosis present

## 2013-08-22 DIAGNOSIS — Z7901 Long term (current) use of anticoagulants: Secondary | ICD-10-CM | POA: Diagnosis not present

## 2013-08-22 DIAGNOSIS — R0602 Shortness of breath: Secondary | ICD-10-CM | POA: Diagnosis present

## 2013-08-22 DIAGNOSIS — I5033 Acute on chronic diastolic (congestive) heart failure: Secondary | ICD-10-CM | POA: Diagnosis present

## 2013-08-22 DIAGNOSIS — M1990 Unspecified osteoarthritis, unspecified site: Secondary | ICD-10-CM | POA: Diagnosis present

## 2013-08-22 DIAGNOSIS — Z85828 Personal history of other malignant neoplasm of skin: Secondary | ICD-10-CM | POA: Diagnosis not present

## 2013-08-22 DIAGNOSIS — I509 Heart failure, unspecified: Secondary | ICD-10-CM

## 2013-08-22 DIAGNOSIS — I4892 Unspecified atrial flutter: Secondary | ICD-10-CM | POA: Diagnosis present

## 2013-08-22 DIAGNOSIS — K219 Gastro-esophageal reflux disease without esophagitis: Secondary | ICD-10-CM | POA: Diagnosis present

## 2013-08-22 DIAGNOSIS — I43 Cardiomyopathy in diseases classified elsewhere: Secondary | ICD-10-CM | POA: Diagnosis present

## 2013-08-22 DIAGNOSIS — I5032 Chronic diastolic (congestive) heart failure: Secondary | ICD-10-CM

## 2013-08-22 DIAGNOSIS — I440 Atrioventricular block, first degree: Secondary | ICD-10-CM | POA: Diagnosis not present

## 2013-08-22 DIAGNOSIS — I498 Other specified cardiac arrhythmias: Secondary | ICD-10-CM | POA: Diagnosis not present

## 2013-08-22 DIAGNOSIS — N182 Chronic kidney disease, stage 2 (mild): Secondary | ICD-10-CM | POA: Diagnosis present

## 2013-08-22 DIAGNOSIS — G4733 Obstructive sleep apnea (adult) (pediatric): Secondary | ICD-10-CM | POA: Diagnosis present

## 2013-08-22 DIAGNOSIS — Z800 Family history of malignant neoplasm of digestive organs: Secondary | ICD-10-CM | POA: Diagnosis not present

## 2013-08-22 DIAGNOSIS — Z8249 Family history of ischemic heart disease and other diseases of the circulatory system: Secondary | ICD-10-CM | POA: Diagnosis not present

## 2013-08-22 DIAGNOSIS — E119 Type 2 diabetes mellitus without complications: Secondary | ICD-10-CM | POA: Diagnosis present

## 2013-08-22 DIAGNOSIS — I130 Hypertensive heart and chronic kidney disease with heart failure and stage 1 through stage 4 chronic kidney disease, or unspecified chronic kidney disease: Secondary | ICD-10-CM | POA: Diagnosis present

## 2013-08-22 DIAGNOSIS — Q211 Atrial septal defect: Secondary | ICD-10-CM | POA: Diagnosis not present

## 2013-08-22 DIAGNOSIS — Z833 Family history of diabetes mellitus: Secondary | ICD-10-CM | POA: Diagnosis not present

## 2013-08-22 LAB — BASIC METABOLIC PANEL
Anion gap: 10 (ref 5–15)
BUN: 19 mg/dL (ref 6–23)
CHLORIDE: 103 meq/L (ref 96–112)
CO2: 30 meq/L (ref 19–32)
Calcium: 9 mg/dL (ref 8.4–10.5)
Creatinine, Ser: 0.98 mg/dL (ref 0.50–1.35)
GFR calc Af Amer: >90 mL/min (ref >90)
GFR calc non Af Amer: 79 mL/min — ABNORMAL LOW (ref >90)
GLUCOSE: 150 mg/dL — AB (ref 70–99)
Potassium: 3.8 mEq/L (ref 3.7–5.3)
Sodium: 143 mEq/L (ref 137–147)

## 2013-08-22 LAB — PROTIME-INR
INR: 1.69 — ABNORMAL HIGH (ref 0.00–1.49)
Prothrombin Time: 19.9 seconds — ABNORMAL HIGH (ref 11.6–15.2)

## 2013-08-22 LAB — GLUCOSE, CAPILLARY
GLUCOSE-CAPILLARY: 144 mg/dL — AB (ref 70–99)
Glucose-Capillary: 155 mg/dL — ABNORMAL HIGH (ref 70–99)
Glucose-Capillary: 113 mg/dL — ABNORMAL HIGH (ref 70–99)
Glucose-Capillary: 127 mg/dL — ABNORMAL HIGH (ref 70–99)

## 2013-08-22 LAB — TROPONIN I: Troponin I: <0.3 ng/mL (ref <0.30)

## 2013-08-22 LAB — HEPARIN LEVEL (UNFRACTIONATED): Heparin Unfractionated: <0.1 IU/mL — ABNORMAL LOW (ref 0.30–0.70)

## 2013-08-22 MED ORDER — HEPARIN BOLUS VIA INFUSION
2500.0000 [IU] | Freq: Once | INTRAVENOUS | Status: AC
  Administered 2013-08-22: 2500 [IU] via INTRAVENOUS
  Filled 2013-08-22: qty 2500

## 2013-08-22 MED ORDER — FUROSEMIDE 40 MG PO TABS
40.0000 mg | ORAL_TABLET | Freq: Every day | ORAL | Status: DC
  Administered 2013-08-22 – 2013-08-24 (×3): 40 mg via ORAL
  Filled 2013-08-22 (×3): qty 1

## 2013-08-22 MED ORDER — HEPARIN (PORCINE) IN NACL 100-0.45 UNIT/ML-% IJ SOLN
2000.0000 [IU]/h | INTRAMUSCULAR | Status: DC
  Administered 2013-08-22 (×2): 1500 [IU]/h via INTRAVENOUS
  Administered 2013-08-22: 1200 [IU]/h via INTRAVENOUS
  Administered 2013-08-23 (×2): 2000 [IU]/h via INTRAVENOUS
  Filled 2013-08-22 (×4): qty 250

## 2013-08-22 MED ORDER — WARFARIN SODIUM 10 MG PO TABS
10.0000 mg | ORAL_TABLET | Freq: Once | ORAL | Status: AC
  Administered 2013-08-22: 10 mg via ORAL
  Filled 2013-08-22 (×2): qty 1

## 2013-08-22 MED ORDER — AMIODARONE HCL 200 MG PO TABS
400.0000 mg | ORAL_TABLET | Freq: Three times a day (TID) | ORAL | Status: AC
  Administered 2013-08-22 (×3): 400 mg via ORAL
  Filled 2013-08-22 (×3): qty 2

## 2013-08-22 MED ORDER — DILTIAZEM HCL ER COATED BEADS 120 MG PO CP24
120.0000 mg | ORAL_CAPSULE | Freq: Every day | ORAL | Status: DC
  Filled 2013-08-22: qty 1

## 2013-08-22 MED ORDER — AMIODARONE HCL 200 MG PO TABS
400.0000 mg | ORAL_TABLET | Freq: Two times a day (BID) | ORAL | Status: DC
  Administered 2013-08-23 – 2013-08-24 (×3): 400 mg via ORAL
  Filled 2013-08-22 (×4): qty 2

## 2013-08-22 NOTE — Progress Notes (Signed)
ANTICOAGULATION CONSULT NOTE - Follow Up Consult  Pharmacy Consult for Heparin Indication: aflutter  Allergies  Allergen Reactions  . Horse-Derived Products Other (See Comments)    Based on skin test reaction  . Other Other (See Comments)    Tetanus Shot -- skin test reaction  . Sunflower Seed [Sunflower Oil] Swelling    Tongue and lip swelling    Patient Measurements: Height: 5\' 11"  (180.3 cm) Weight: 194 lb 3.2 oz (88.089 kg) IBW/kg (Calculated) : 75.3 Heparin Dosing Weight: 88.1 kg  Vital Signs: Temp: 98.4 F (36.9 C) (08/18 1453) Temp src: Oral (08/18 1453) BP: 132/73 mmHg (08/18 1453) Pulse Rate: 53 (08/18 1453)  Labs:  Recent Labs  08/21/13 1241  08/21/13 1245 08/21/13 2200 08/22/13 0424 08/22/13 1023 08/22/13 1638  HGB 12.5*  --   --   --   --   --   --   HCT 37.9*  --   --   --   --   --   --   PLT 213  --   --   --   --   --   --   LABPROT  --   --  18.8*  --  19.9*  --   --   INR  --   --  1.57*  --  1.69*  --   --   HEPARINUNFRC  --   --   --   --   --   --  <0.10*  CREATININE 0.92  --   --   --  0.98  --   --   TROPONINI  --   < >  --  <0.30 <0.30 <0.30  --   < > = values in this interval not displayed.  Estimated Creatinine Clearance: 70.4 ml/min (by C-G formula based on Cr of 0.98).   Assessment: Aflutter with heparin>>Coumadin overlap while awaiting therapeutic INR. Heparin level <0.1.   Goal of Therapy:  Heparin level 0.3-0.7 units/ml Monitor platelets by anticoagulation protocol: Yes   Plan:  Increase IV heparin to 1500 units/hr and recheck level in 6 hrs.   Jaceion Aday S. Alford Highland, PharmD, BCPS Clinical Staff Pharmacist Pager [phone]  Eilene Ghazi Stillinger 08/22/2013,5:31 PM

## 2013-08-22 NOTE — Consult Note (Signed)
ANTICOAGULATION CONSULT NOTE - Initial Consult  Pharmacy Consult for Coumadin / Heparin bridge Indication: afib, aflutter  Allergies  Allergen Reactions  . Horse-Derived Products Other (See Comments)    Based on skin test reaction  . Other Other (See Comments)    Tetanus Shot -- skin test reaction  . Sunflower Seed [Sunflower Oil] Swelling    Tongue and lip swelling   Vital Signs: Temp: 98.1 F (36.7 C) (08/18 0703) Temp src: Oral (08/18 0703) BP: 136/64 mmHg (08/18 0703) Pulse Rate: 63 (08/18 0703)  Labs:  Recent Labs  08/21/13 1241 08/21/13 1242 08/21/13 1245 08/21/13 2200 08/22/13 0424  HGB 12.5*  --   --   --   --   HCT 37.9*  --   --   --   --   PLT 213  --   --   --   --   LABPROT  --   --  18.8*  --  19.9*  INR  --   --  1.57*  --  1.69*  CREATININE 0.92  --   --   --  0.98  TROPONINI  --  <0.30  --  <0.30 <0.30    Estimated Creatinine Clearance: 70.4 ml/min (by C-G formula based on Cr of 0.98).   Medical History: Past Medical History  Diagnosis Date  . Hx of adenomatous colonic polyps   . Diabetes mellitus type II   . Hypertension   . Diverticulosis 2001  . Hyperlipidemia   . Persistent atrial fibrillation     a. on amio and coumadin;  b. s/p MAZE 04/2013 in setting of CABG.  . Obstructive sleep apnea     compliant with CPAP  . Chronic diastolic congestive heart failure   . Hypertensive cardiomyopathy   . History of cardioversion     x3 (years uncertain)  . H/O hiatal hernia   . Cancer     Basal cell carcinoma  . Adrenal adenoma   . CAD (coronary artery disease)     a. 04/2013 CABG x 2: LIMA to LAD, SVG to RI, EVH via R thigh.  . S/P Maze operation for atrial fibrillation     a. 04/2013: Complete bilateral atrial lesion set using cryothermy and bipolar radiofrequency ablation with clipping of LA appendage (@ time of CABG)  . Type II or unspecified type diabetes mellitus without mention of complication, not stated as uncontrolled   . GERD  (gastroesophageal reflux disease)   . Depression   . Anxiety   . DJD (degenerative joint disease)   . Atrial flutter 2015   Assessment: 73yom on coumadin pta for afib presents to the ED with new onset aflutter. INR on admission subtherapeutic at 1.57 - apparently is a chronic issue for him. May change to NOAC if patient is agreeable, but coumadin to continue for now.  Home dose: 7.5mg  daily except 10mg  on Sunday - last dose 8/16  INR still low today at 1.69 --> adding heparin bridge  Goal of Therapy:  INR 2-3 Monitor platelets by anticoagulation protocol: Yes Heparin level = 0.3-0.7   Plan:  1) Repeat Coumadin 10mg  x 1 tonight 2) Heparin 2500 unit iv bolus x 1, drip at 1200 units / hr 3) 6 hr heparin level 4) Daily heparin level, CBC, INR  Thank you. Anette Guarneri, PharmD [phone]  08/22/2013,9:55 AM

## 2013-08-22 NOTE — Progress Notes (Signed)
UR completed 

## 2013-08-22 NOTE — Progress Notes (Signed)
Entered room to see if patient was ready to be placed on CPAP. Patient says he places himself on CPAP when he is ready before sleeping.

## 2013-08-22 NOTE — Progress Notes (Addendum)
Subjective:  He feels much better this am. Less SOB  Objective:  Vital Signs in the last 24 hours: Temp:  [98.1 F (36.7 C)-98.8 F (37.1 C)] 98.1 F (36.7 C) (08/18 0703) Pulse Rate:  [54-86] 63 (08/18 0703) Resp:  [12-31] 17 (08/18 0703) BP: (104-167)/(47-99) 136/64 mmHg (08/18 0703) SpO2:  [91 %-99 %] 97 % (08/18 0703) Weight:  [194 lb 3.2 oz (88.089 kg)] 194 lb 3.2 oz (88.089 kg) (08/18 0703)  Intake/Output from previous day:  Intake/Output Summary (Last 24 hours) at 08/22/13 0835 Last data filed at 08/22/13 0755  Gross per 24 hour  Intake 386.08 ml  Output   1845 ml  Net -1458.92 ml    Physical Exam: General appearance: alert, cooperative and no distress Lungs: clear to auscultation bilaterally Heart: irregularly irregular rhythm   Rate: 64  Rhythm: atrial flutter  Lab Results:  Recent Labs  08/21/13 1241  WBC 8.1  HGB 12.5*  PLT 213    Recent Labs  08/21/13 1241 08/22/13 0424  NA 146 143  K 4.1 3.8  CL 105 103  CO2 29 30  GLUCOSE 187* 150*  BUN 19 19  CREATININE 0.92 0.98    Recent Labs  08/21/13 2200 08/22/13 0424  TROPONINI <0.30 <0.30    Recent Labs  08/22/13 0424  INR 1.69*    Imaging: Imaging results have been reviewed  Cardiac Studies:  Assessment/Plan:  73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 He is s/p CABG with Maze 04/2013. He has HTN cardiomyopathy with severe LVH and NL LVF April 2015. He has failed Tikosyn and amiodarone in the past. He has been reluctant to take NOAC secondary to fear of bleeding problems. He is on Coumadin but is chronically sub therapeutic. He presented 08/21/13 with dyspnea, CHF, A flutter with RVR. Norvasc was changed to Diltiazem and his rate is improved. He diuresed 1.4L.    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 He was instructed to stop his Amiodarone when his Rx ran (a few weeks ago).  IV Diltiazem was stopped early this am secondary to low HR. Will start low dose PO Diltiazem (120 mg daily) and ambulate to see what his HR does when up. Most of his pre admission symptoms were exertional. Check 12 lead this am. Resume home dose po Lasix. Coumadin per pharmacy. ? Resume Amiodarone and plan for DCCV in 4 weeks, ? TEE CV- will review with MD.   Kerin Ransom PA-C Beeper [phone] 08/22/2013, 8:35 AM   Personally seen and examined. Agree with above.  73 year old male with bypass surgery in April of 2015, hypertrophic cardiomyopathy, diastolic heart failure, maze procedure, prior ablative procedure in [place], subtherapeutic INR. He has failed Tikosyn in the past.  1. Paroxysmal atrial flutter - a noticeably symptomatic. Status post maze. Reinitiate amiodarone by mouth loading 400 mg 3 times a day. Blood pressure should be able to tolerate. Stop  diltiazem. Heart rate had decreased during the night. Continue with low-dose beta blocker metoprolol 25 mg twice a day. Starting on IV heparin, need therapeutic for cardioversion. Plan n.p.o. past midnight. TEE cardioversion tomorrow. Despite ligation of left atrial appendage, there is a small risk of thrombi development along the left atrial free wall.  After cardioversion tomorrow, continue with Coumadin. He has been subtherapeutic. Continue with amiodarone for at least 2 more months.  2. Coronary artery disease-doing well post bypass. No anginal symptoms.  3. Chronic diastolic heart failure-atrial flutter  exacerbating. Noticeably short of breath when getting his newspaper. Cardiac rehabilitation.  4. Obstructive sleep apnea-continue with CPAP.  5. Hypertrophic cardiomyopathy-severe LVH noted on echocardiogram. It is important for [place] to maintain sinus rhythm.  Candee Furbish, MD

## 2013-08-22 NOTE — Progress Notes (Signed)
Nutrition Brief Note  Patient identified on the Malnutrition Screening Tool (MST) Report  Wt Readings from Last 15 Encounters:  08/22/13 194 lb 3.2 oz (88.089 kg)  07/21/13 196 lb 9.6 oz (89.177 kg)  07/10/13 190 lb (86.183 kg)  06/20/13 192 lb (87.091 kg)  05/26/13 185 lb 8 oz (84.142 kg)  05/25/13 189 lb 2.5 oz (85.8 kg)  05/19/13 191 lb (86.637 kg)  05/15/13 191 lb (86.637 kg)  05/01/13 208 lb (94.348 kg)  04/26/13 204 lb (92.534 kg)  04/18/13 208 lb (94.348 kg)  04/14/13 200 lb 9.6 oz (90.992 kg)  04/12/13 203 lb 4.2 oz (92.2 kg)  04/12/13 203 lb 4.2 oz (92.2 kg)  04/03/13 200 lb (90.719 kg)    Body mass index is 27.1 kg/(m^2). Patient meets criteria for Overweight based on current BMI. Pt reports that after his CABG surgery in April he had a poor appetite for 5 weeks and lost from 208 lbs to 170 lbs. He reports that since then his appetite has been good and he has been maintaining his weight around 190 lbs.   Current diet order is Heart Healthy/Carb Modifed, patient is consuming approximately 80-100% of meals at this time. Labs and medications reviewed.   No nutrition interventions warranted at this time. If nutrition issues arise, please consult RD.   Pryor Ochoa RD, LDN Inpatient Clinical Dietitian Pager: [phone] After Hours Pager: [phone]

## 2013-08-23 ENCOUNTER — Inpatient Hospital Stay (HOSPITAL_COMMUNITY): Payer: Medicare Other | Admitting: Certified Registered"

## 2013-08-23 ENCOUNTER — Encounter (HOSPITAL_COMMUNITY)
Admission: EM | Disposition: A | Discharge status: Home / Self Care | Payer: Self-pay | Source: Home / Self Care | Attending: Cardiovascular Disease

## 2013-08-23 ENCOUNTER — Encounter (HOSPITAL_COMMUNITY): Payer: Medicare Other | Admitting: Certified Registered"

## 2013-08-23 ENCOUNTER — Encounter (HOSPITAL_COMMUNITY): Admission: RE | Admit: 2013-08-23 | Payer: Medicare Other | Source: Ambulatory Visit

## 2013-08-23 ENCOUNTER — Encounter (HOSPITAL_COMMUNITY): Payer: Self-pay | Admitting: *Deleted

## 2013-08-23 HISTORY — PX: TEE WITHOUT CARDIOVERSION: SHX5443

## 2013-08-23 HISTORY — PX: CARDIOVERSION: SHX1299

## 2013-08-23 LAB — GLUCOSE, CAPILLARY
GLUCOSE-CAPILLARY: 121 mg/dL — AB (ref 70–99)
Glucose-Capillary: 130 mg/dL — ABNORMAL HIGH (ref 70–99)
Glucose-Capillary: 168 mg/dL — ABNORMAL HIGH (ref 70–99)
Glucose-Capillary: 136 mg/dL — ABNORMAL HIGH (ref 70–99)
Glucose-Capillary: 287 mg/dL — ABNORMAL HIGH (ref 70–99)

## 2013-08-23 LAB — CBC
HCT: 36.8 % — ABNORMAL LOW (ref 39.0–52.0)
HEMOGLOBIN: 12.5 g/dL — AB (ref 13.0–17.0)
MCH: 30.7 pg (ref 26.0–34.0)
MCHC: 34 g/dL (ref 30.0–36.0)
MCV: 90.4 fL (ref 78.0–100.0)
Platelets: 228 10*3/uL (ref 150–400)
RBC: 4.07 MIL/uL — ABNORMAL LOW (ref 4.22–5.81)
RDW: 16.4 % — ABNORMAL HIGH (ref 11.5–15.5)
WBC: 7.8 10*3/uL (ref 4.0–10.5)

## 2013-08-23 LAB — BASIC METABOLIC PANEL
Anion gap: 14 (ref 5–15)
BUN: 24 mg/dL — ABNORMAL HIGH (ref 6–23)
CALCIUM: 9.3 mg/dL (ref 8.4–10.5)
CO2: 24 mEq/L (ref 19–32)
Chloride: 101 mEq/L (ref 96–112)
Creatinine, Ser: 1.02 mg/dL (ref 0.50–1.35)
GFR calc Af Amer: 82 mL/min — ABNORMAL LOW (ref >90)
GFR, EST NON AFRICAN AMERICAN: 70 mL/min — AB (ref >90)
Glucose, Bld: 153 mg/dL — ABNORMAL HIGH (ref 70–99)
Potassium: 3.9 mEq/L (ref 3.7–5.3)
SODIUM: 139 meq/L (ref 137–147)

## 2013-08-23 LAB — PROTIME-INR
INR: 1.63 — ABNORMAL HIGH (ref 0.00–1.49)
PROTHROMBIN TIME: 19.3 s — AB (ref 11.6–15.2)

## 2013-08-23 LAB — HEPARIN LEVEL (UNFRACTIONATED)
HEPARIN UNFRACTIONATED: 0.27 [IU]/mL — AB (ref 0.30–0.70)
Heparin Unfractionated: 0.2 IU/mL — ABNORMAL LOW (ref 0.30–0.70)
Heparin Unfractionated: <0.1 IU/mL — ABNORMAL LOW (ref 0.30–0.70)

## 2013-08-23 SURGERY — ECHOCARDIOGRAM, TRANSESOPHAGEAL
Anesthesia: Monitor Anesthesia Care

## 2013-08-23 MED ORDER — PROPOFOL 10 MG/ML IV BOLUS
INTRAVENOUS | Status: AC
  Filled 2013-08-23: qty 20

## 2013-08-23 MED ORDER — ONDANSETRON HCL 4 MG/2ML IJ SOLN: 4.0000 mg | Freq: Once | INTRAMUSCULAR | Status: AC | PRN

## 2013-08-23 MED ORDER — DILTIAZEM HCL ER COATED BEADS 120 MG PO CP24
120.0000 mg | ORAL_CAPSULE | Freq: Every day | ORAL | Status: DC
  Administered 2013-08-23 – 2013-08-24 (×2): 120 mg via ORAL
  Filled 2013-08-23 (×2): qty 1

## 2013-08-23 MED ORDER — HEPARIN (PORCINE) IN NACL 100-0.45 UNIT/ML-% IJ SOLN
2150.0000 [IU]/h | INTRAMUSCULAR | Status: DC
  Administered 2013-08-24: 2150 [IU]/h via INTRAVENOUS
  Filled 2013-08-23 (×4): qty 250

## 2013-08-23 MED ORDER — WARFARIN SODIUM 10 MG PO TABS
10.0000 mg | ORAL_TABLET | Freq: Once | ORAL | Status: AC
  Administered 2013-08-23: 10 mg via ORAL
  Filled 2013-08-23: qty 1

## 2013-08-23 MED ORDER — PROPOFOL INFUSION 10 MG/ML OPTIME
INTRAVENOUS | Status: DC | PRN
  Administered 2013-08-23: 100 ug/kg/min via INTRAVENOUS

## 2013-08-23 MED ORDER — BUTAMBEN-TETRACAINE-BENZOCAINE 2-2-14 % EX AERO
INHALATION_SPRAY | CUTANEOUS | Status: DC | PRN
  Administered 2013-08-23: 2 via TOPICAL

## 2013-08-23 MED ORDER — FENTANYL CITRATE 0.05 MG/ML IJ SOLN: 25.0000 ug | INTRAMUSCULAR | Status: DC | PRN

## 2013-08-23 MED ORDER — HEPARIN BOLUS VIA INFUSION
2000.0000 [IU] | Freq: Once | INTRAVENOUS | Status: AC
  Administered 2013-08-23: 2000 [IU] via INTRAVENOUS
  Filled 2013-08-23: qty 2000

## 2013-08-23 MED ORDER — HEPARIN BOLUS VIA INFUSION
1000.0000 [IU] | INTRAVENOUS | Status: AC
  Administered 2013-08-23: 1000 [IU] via INTRAVENOUS
  Filled 2013-08-23: qty 1000

## 2013-08-23 MED ORDER — SODIUM CHLORIDE 0.9 % IV SOLN
INTRAVENOUS | Status: DC | PRN
  Administered 2013-08-23: 12:00:00 via INTRAVENOUS

## 2013-08-23 MED ORDER — ONDANSETRON HCL 4 MG/2ML IJ SOLN
INTRAMUSCULAR | Status: DC | PRN
  Administered 2013-08-23: 4 mg via INTRAVENOUS

## 2013-08-23 MED ORDER — PROPOFOL 10 MG/ML IV BOLUS
INTRAVENOUS | Status: DC | PRN
  Administered 2013-08-23 (×2): 40 mg via INTRAVENOUS

## 2013-08-23 NOTE — Consult Note (Signed)
ANTICOAGULATION CONSULT NOTE - Initial Consult  Pharmacy Consult for Coumadin / Heparin bridge Indication: afib, aflutter  Allergies  Allergen Reactions  . Horse-Derived Products Other (See Comments)    Based on skin test reaction  . Other Other (See Comments)    Tetanus Shot -- skin test reaction  . Sunflower Seed [Sunflower Oil] Swelling    Tongue and lip swelling    Labs:  Recent Labs  08/21/13 1241  08/21/13 1245 08/21/13 2200 08/22/13 0424 08/22/13 1023 08/22/13 1638 08/23/13 0004 08/23/13 0931  HGB 12.5*  --   --   --   --   --   --  12.5*  --   HCT 37.9*  --   --   --   --   --   --  36.8*  --   PLT 213  --   --   --   --   --   --  228  --   LABPROT  --   --  18.8*  --  19.9*  --   --  19.3*  --   INR  --   --  1.57*  --  1.69*  --   --  1.63*  --   HEPARINUNFRC  --   --   --   --   --   --  <0.10* <0.10* 0.20*  CREATININE 0.92  --   --   --  0.98  --   --  1.02  --   TROPONINI  --   < >  --  <0.30 <0.30 <0.30  --   --   --   < > = values in this interval not displayed.  Estimated Creatinine Clearance: 67.7 ml/min (by C-G formula based on Cr of 1.02).   Assessment: 73yom on coumadin pta for afib presents to the ED with new onset aflutter. INR on admission subtherapeutic at 1.57 - apparently is a chronic issue for him. May change to NOAC if patient is agreeable, but coumadin to continue for now.  Home dose: 7.5mg  daily except 10mg  on Sunday - last dose 8/16  INR still low today at 1.63 Heparin level still low at 0.2  TEE/DCCV planned for today  Goal of Therapy:  INR 2-3 Monitor platelets by anticoagulation protocol: Yes Heparin level = 0.3-0.7   Plan:  1) Repeat Coumadin 10mg  x 1 tonight 2) Increase heparin to 2000 units / hr  3) 8 hr heparin level 4) Daily heparin level, CBC, INR  Thank you. Anette Guarneri, PharmD [phone]  08/23/2013,10:28 AM

## 2013-08-23 NOTE — Anesthesia Preprocedure Evaluation (Addendum)
Anesthesia Evaluation  Patient identified by MRN, date of birth, ID band Patient awake    Reviewed: Allergy & Precautions, H&P , NPO status , Patient's Chart, lab work & pertinent test results  Airway Mallampati: II TM Distance: >3 FB Neck ROM: Full    Dental  (+) Teeth Intact, Dental Advisory Given   Pulmonary sleep apnea and Continuous Positive Airway Pressure Ventilation , former smoker,  breath sounds clear to auscultation        Cardiovascular hypertension, Pt. on home beta blockers + CAD and +CHF Rhythm:Regular Rate:Normal     Neuro/Psych    GI/Hepatic hiatal hernia, GERD-  Medicated and Controlled,  Endo/Other  diabetes, Well Controlled, Type 2, Oral Hypoglycemic Agents  Renal/GU      Musculoskeletal   Abdominal   Peds  Hematology   Anesthesia Other Findings   Reproductive/Obstetrics                      Anesthesia Physical Anesthesia Plan  ASA: III  Anesthesia Plan: General   Post-op Pain Management:    Induction: Intravenous  Airway Management Planned: Mask  Additional Equipment:   Intra-op Plan:   Post-operative Plan:   Informed Consent: I have reviewed the patients History and Physical, chart, labs and discussed the procedure including the risks, benefits and alternatives for the proposed anesthesia with the patient or authorized representative who has indicated his/her understanding and acceptance.   Dental advisory given  Plan Discussed with: CRNA, Anesthesiologist and Surgeon  Anesthesia Plan Comments:         Anesthesia Quick Evaluation

## 2013-08-23 NOTE — Transfer of Care (Signed)
Immediate Anesthesia Transfer of Care Note  Patient: [name]  Procedure(s) Performed: Procedure(s): TRANSESOPHAGEAL ECHOCARDIOGRAM (TEE) (N/A) CARDIOVERSION (N/A)  Patient Location: Endoscopy Unit  Anesthesia Type:MAC  Level of Consciousness: awake, alert  and oriented  Airway & Oxygen Therapy: Patient Spontanous Breathing  Post-op Assessment: Report given to PACU RN  Post vital signs: Reviewed and stable  Complications: No apparent anesthesia complications

## 2013-08-23 NOTE — Progress Notes (Signed)
Report given to receiving RN. Patient sitting in recliner. No verbal complaints and no signs or symptoms of distress or discomfort noted.

## 2013-08-23 NOTE — Interval H&P Note (Signed)
History and Physical Interval Note:  08/23/2013 12:25 PM  [name]  has presented today for surgery, with the diagnosis of A FIB   The various methods of treatment have been discussed with the patient and family. After consideration of risks, benefits and other options for treatment, the patient has consented to  Procedure(s): TRANSESOPHAGEAL ECHOCARDIOGRAM (TEE) (N/A) CARDIOVERSION (N/A) as a surgical intervention .  The patient's history has been reviewed, patient examined, no change in status, stable for surgery.  I have reviewed the patient's chart and labs.  Questions were answered to the patient's satisfaction.     Virgilene Stryker

## 2013-08-23 NOTE — Anesthesia Procedure Notes (Signed)
Procedure Name: MAC Date/Time: 08/23/2013 12:37 PM Performed by: Barrington Ellison Pre-anesthesia Checklist: Patient identified, Emergency Drugs available, Suction available, Patient being monitored and Timeout performed Patient Re-evaluated:Patient Re-evaluated prior to inductionOxygen Delivery Method: Nasal cannula

## 2013-08-23 NOTE — Progress Notes (Signed)
ANTICOAGULATION CONSULT NOTE - Follow Up Consult  Pharmacy Consult for Heparin  Indication: A-flutter  Allergies  Allergen Reactions  . Horse-Derived Products Other (See Comments)    Based on skin test reaction  . Other Other (See Comments)    Tetanus Shot -- skin test reaction  . Sunflower Seed [Sunflower Oil] Swelling    Tongue and lip swelling    Patient Measurements: Height: 5\' 11"  (180.3 cm) Weight: 194 lb 3.2 oz (88.089 kg) IBW/kg (Calculated) : 75.3  Vital Signs: Temp: 98.7 F (37.1 C) (08/18 2104) Temp src: Oral (08/18 2104) BP: 149/72 mmHg (08/18 2210) Pulse Rate: 60 (08/18 2104)  Labs:  Recent Labs  08/21/13 1241  08/21/13 1245 08/21/13 2200 08/22/13 0424 08/22/13 1023 08/22/13 1638 08/23/13 0004  HGB 12.5*  --   --   --   --   --   --  12.5*  HCT 37.9*  --   --   --   --   --   --  36.8*  PLT 213  --   --   --   --   --   --  228  LABPROT  --   --  18.8*  --  19.9*  --   --  19.3*  INR  --   --  1.57*  --  1.69*  --   --  1.63*  HEPARINUNFRC  --   --   --   --   --   --  <0.10* <0.10*  CREATININE 0.92  --   --   --  0.98  --   --  1.02  TROPONINI  --   < >  --  <0.30 <0.30 <0.30  --   --   < > = values in this interval not displayed.  Estimated Creatinine Clearance: 67.7 ml/min (by C-G formula based on Cr of 1.02).   Assessment: Sub-therapeutic heparin level, other labs as above.   Goal of Therapy:  Heparin level 0.3-0.7 units/ml Monitor platelets by anticoagulation protocol: Yes   Plan:  -Heparin 2000 units BOLUS -Increase heparin drip to 1750 units/hr -0930 HL -Daily CBC/HL -Monitor for bleeding  Narda Bonds 08/23/2013,1:01 AM

## 2013-08-23 NOTE — Anesthesia Postprocedure Evaluation (Signed)
  Anesthesia Post-op Note  Patient: [name]  Procedure(s) Performed: Procedure(s): TRANSESOPHAGEAL ECHOCARDIOGRAM (TEE) (N/A) CARDIOVERSION (N/A)  Patient Location: PACU  Anesthesia Type: General   Level of Consciousness: awake, alert  and oriented  Airway and Oxygen Therapy: Patient Spontanous Breathing  Post-op Pain: none  Post-op Assessment: Post-op Vital signs reviewed  Post-op Vital Signs: Reviewed  Last Vitals:  Filed Vitals:   08/23/13 1335  BP:   Pulse: 77  Temp:   Resp: 16    Complications: No apparent anesthesia complications

## 2013-08-23 NOTE — Op Note (Signed)
INDICATIONS: atrial flutter  PROCEDURE:   Informed consent was obtained prior to the procedure. The risks, benefits and alternatives for the procedure were discussed and the patient comprehended these risks.  Risks include, but are not limited to, cough, sore throat, vomiting, nausea, somnolence, esophageal and stomach trauma or perforation, bleeding, low blood pressure, aspiration, pneumonia, infection, trauma to the teeth and death.    After a procedural time-out, the oropharynx was anesthetized with 20% benzocaine spray. The patient was given propofol IV infusion (Anesthesiology, Dr. Al Corpus) for deep sedation.   The transesophageal probe was inserted in the esophagus and stomach without difficulty and multiple views were obtained.  The patient was kept under observation until the patient left the procedure room.  The patient left the procedure room in stable condition.   Agitated microbubble saline contrast was not administered.  COMPLICATIONS:    There were no immediate complications.  FINDINGS:  Partially ligated left atrial appendage - the appendage base is open. No clot is seen and flutter appendage emptying velocities are normal. Normal LVEF. No significant valvular abnormalities. Small PFO with tiny bidirectional shunt  RECOMMENDATIONS:     Proceed with cardioversion   Time Spent Directly with the Patient:  30 minutes   [name] 08/23/2013, 12:35 PM

## 2013-08-23 NOTE — Progress Notes (Signed)
    Subjective:  No complaints of SOB  Objective:  Vital Signs in the last 24 hours: Temp:  [97.4 F (36.3 C)-98.7 F (37.1 C)] 97.8 F (36.6 C) (08/19 0623) Pulse Rate:  [53-89] 57 (08/19 0623) Resp:  [16-18] 16 (08/19 0623) BP: (131-168)/(71-88) 139/71 mmHg (08/19 0623) SpO2:  [96 %-99 %] 98 % (08/19 0623) Weight:  [190 lb 7.6 oz (86.4 kg)] 190 lb 7.6 oz (86.4 kg) (08/19 0623)  Intake/Output from previous day:  Intake/Output Summary (Last 24 hours) at 08/23/13 [id number] Last data filed at 08/23/13 0630  Gross per 24 hour  Intake    884 ml  Output   1470 ml  Net   -586 ml    Physical Exam: General appearance: alert, cooperative and no distress Lungs: clear to auscultation bilaterally Heart: irregularly irregular rhythm   Rate: 66  Rhythm: atrial flutter  Lab Results:  Recent Labs  08/21/13 1241 08/23/13 0004  WBC 8.1 7.8  HGB 12.5* 12.5*  PLT 213 228    Recent Labs  08/22/13 0424 08/23/13 0004  NA 143 139  K 3.8 3.9  CL 103 101  CO2 30 24  GLUCOSE 150* 153*  BUN 19 24*  CREATININE 0.98 1.02    Recent Labs  08/22/13 0424 08/22/13 1023  TROPONINI <0.30 <0.30    Recent Labs  08/23/13 0004  INR 1.63*    Imaging: Imaging results have been reviewed  Cardiac Studies:  Assessment/Plan:  73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 He failed Tikosyn fall of 2014. He underwent CABG with Maze and LA ligation 04/24/13. He was put on Amiodarone pre CABG and this was later discontinued in July as he was holding NSR post op. He has HTN cardiomyopathy with severe LVH and NL LVF by echo April 2015.  Marland KitchenHe has been reluctant to take NOAC secondary to fear of bleeding problems. He is on Coumadin but is chronically sub therapeutic. He presented 08/21/13 with dyspnea, CHF, and A flutter with RVR. Norvasc was changed to Diltiazem and his rate is improved. He diuresed 1.4L.    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 I stressed importance of keeping his INR therapeutic after cardioversion (Dr Melford Aase follows his INR).   Kerin Ransom PA-C Beeper [id number] 08/23/2013, 8:22 AM   Personally seen and examined. Agree with above. Cardioversion. Postop maze. IV heparin, INR has been subtherapeutic on Coumadin Continue with treatment of diastolic heart failure, chronic. Hypertrophic cardiomyopathy playing a role. Candee Furbish, MD

## 2013-08-23 NOTE — Progress Notes (Addendum)
PA notified and made aware about patient's heart increasing to the 140s and 150s with activity after TEE and cardioversion today. BP is 151/91 and HR is 72. Patient is asymptomatic with no verbal complaints and no signs of distress or discomfort. No new orders given at this time. Will continue to monitor patient for further changes.

## 2013-08-23 NOTE — CV Procedure (Signed)
Procedure: Electrical Cardioversion Indications:  Atrial Flutter  Procedure Details:  Consent: Risks of procedure as well as the alternatives and risks of each were explained to the (patient/caregiver).  Consent for procedure obtained.  Time Out: Verified patient identification, verified procedure, site/side was marked, verified correct patient position, special equipment/implants available, medications/allergies/relevent history reviewed, required imaging and test results available.  Performed  Patient placed on cardiac monitor, pulse oximetry, supplemental oxygen as necessary.  Sedation given: IV propofol infusion, Dr. Al Corpus Pacer pads placed anterior and posterior chest.  Cardioverted 1 time(s).  Cardioverted at 120J. Sync biphasic  Evaluation: Findings: Post procedure EKG shows: NSR. Brief atrial tachycardia after cardioversion Complications: None Patient did tolerate procedure well.  Time Spent Directly with the Patient:  30 minutes   Deandre Stansel 08/23/2013, 1:00 PM

## 2013-08-23 NOTE — Progress Notes (Signed)
  Echocardiogram Echocardiogram Transesophageal has been performed.  [name] 08/23/2013, 1:32 PM

## 2013-08-23 NOTE — Progress Notes (Signed)
Called because pt's HR up to 140 when up. I suspect he is back in A flutter. Continue Amiodarone loading and Lopressor. Add Diltiazem CD 120 mg daily.   Kerin Ransom PA-C 08/23/2013 4:26 PM

## 2013-08-23 NOTE — Consult Note (Signed)
ANTICOAGULATION CONSULT NOTE - Follow up  Pharmacy Consult for Coumadin / Heparin bridge Indication: afib, aflutter  Allergies  Allergen Reactions  . Horse-Derived Products Other (See Comments)    Based on skin test reaction  . Other Other (See Comments)    Tetanus Shot -- skin test reaction  . Sunflower Seed [Sunflower Oil] Swelling    Tongue and lip swelling   Patient Measurements: Height: 5\' 11"  (180.3 cm) Weight: 190 lb 7.6 oz (86.4 kg) IBW/kg (Calculated) : 75.3 Heparin Dosing Weight: 86.4 kg  Labs:  Recent Labs  08/21/13 1241  08/21/13 1245 08/21/13 2200 08/22/13 0424 08/22/13 1023  08/23/13 0004 08/23/13 0931 08/23/13 1945  HGB 12.5*  --   --   --   --   --   --  12.5*  --   --   HCT 37.9*  --   --   --   --   --   --  36.8*  --   --   PLT 213  --   --   --   --   --   --  228  --   --   LABPROT  --   --  18.8*  --  19.9*  --   --  19.3*  --   --   INR  --   --  1.57*  --  1.69*  --   --  1.63*  --   --   HEPARINUNFRC  --   --   --   --   --   --   < > <0.10* 0.20* 0.27*  CREATININE 0.92  --   --   --  0.98  --   --  1.02  --   --   TROPONINI  --   < >  --  <0.30 <0.30 <0.30  --   --   --   --   < > = values in this interval not displayed.  Estimated Creatinine Clearance: 67.7 ml/min (by C-G formula based on Cr of 1.02).   Assessment: Heparin level 0.27 on IV heparin drip 2000 units/hr in this 73yo male who was on coumadin pta for afib and presented to the ED on 08/21/13 with new onset aflutter.  Heparin level increasing toward therapeutic range 0.3-0.7 after rate increased earlier today. No bleeding noted.  S/p Electrical Cardioversion today- NSR but cardiologist suspects he is back in A flutter. MD is continuing the amiodarone loading, lopressor and added diltiazem.    INR on admission subtherapeutic at 1.57 - apparently is a chronic issue for him. May change to NOAC if patient is agreeable, but coumadin to continue for now. INR today at 1.63.  Started on  amiodarone load 08/22/13.  Home dose: 7.5mg  daily except 10mg  on Sunday - last dose 8/16   Goal of Therapy:  INR 2-3 Monitor platelets by anticoagulation protocol: Yes Heparin level = 0.3-0.7   Plan:  Give 1000 unit IV heparin bolus x1. Increase heparin to 2150 units / hr  8 hr heparin level (with AM labs) Daily heparin level, CBC, INR  Nicole Cella, RPh Clinical Pharmacist Pager: [phone] 08/23/2013,8:48 PM

## 2013-08-24 ENCOUNTER — Encounter (HOSPITAL_COMMUNITY): Payer: Self-pay | Admitting: Cardiovascular Disease

## 2013-08-24 DIAGNOSIS — E1129 Type 2 diabetes mellitus with other diabetic kidney complication: Secondary | ICD-10-CM

## 2013-08-24 LAB — CBC
HCT: 34.3 % — ABNORMAL LOW (ref 39.0–52.0)
HEMOGLOBIN: 11.2 g/dL — AB (ref 13.0–17.0)
MCH: 30 pg (ref 26.0–34.0)
MCHC: 32.7 g/dL (ref 30.0–36.0)
MCV: 92 fL (ref 78.0–100.0)
Platelets: 203 10*3/uL (ref 150–400)
RBC: 3.73 MIL/uL — AB (ref 4.22–5.81)
RDW: 16.8 % — ABNORMAL HIGH (ref 11.5–15.5)
WBC: 7.3 10*3/uL (ref 4.0–10.5)

## 2013-08-24 LAB — GLUCOSE, CAPILLARY
GLUCOSE-CAPILLARY: 144 mg/dL — AB (ref 70–99)
Glucose-Capillary: 108 mg/dL — ABNORMAL HIGH (ref 70–99)

## 2013-08-24 LAB — PROTIME-INR
INR: 1.97 — ABNORMAL HIGH (ref 0.00–1.49)
PROTHROMBIN TIME: 22.4 s — AB (ref 11.6–15.2)

## 2013-08-24 LAB — BASIC METABOLIC PANEL
Anion gap: 10 (ref 5–15)
BUN: 28 mg/dL — AB (ref 6–23)
CHLORIDE: 104 meq/L (ref 96–112)
CO2: 26 meq/L (ref 19–32)
Calcium: 9.3 mg/dL (ref 8.4–10.5)
Creatinine, Ser: 1.25 mg/dL (ref 0.50–1.35)
GFR calc Af Amer: 64 mL/min — ABNORMAL LOW (ref >90)
GFR calc non Af Amer: 55 mL/min — ABNORMAL LOW (ref >90)
GLUCOSE: 149 mg/dL — AB (ref 70–99)
POTASSIUM: 4.2 meq/L (ref 3.7–5.3)
SODIUM: 140 meq/L (ref 137–147)

## 2013-08-24 LAB — HEPARIN LEVEL (UNFRACTIONATED): Heparin Unfractionated: 0.27 IU/mL — ABNORMAL LOW (ref 0.30–0.70)

## 2013-08-24 MED ORDER — AMIODARONE HCL 200 MG PO TABS: ORAL_TABLET | ORAL | Status: DC

## 2013-08-24 MED ORDER — ENOXAPARIN SODIUM 100 MG/ML ~~LOC~~ SOLN
90.0000 mg | Freq: Once | SUBCUTANEOUS | Status: AC
  Administered 2013-08-24: 90 mg via SUBCUTANEOUS
  Filled 2013-08-24: qty 1

## 2013-08-24 MED ORDER — METOPROLOL TARTRATE 25 MG PO TABS
25.0000 mg | ORAL_TABLET | Freq: Two times a day (BID) | ORAL | Status: DC
Start: 2013-08-24 — End: 2013-09-19

## 2013-08-24 MED ORDER — DILTIAZEM HCL ER COATED BEADS 120 MG PO CP24: 120.0000 mg | ORAL_CAPSULE | Freq: Every day | ORAL | Status: DC

## 2013-08-24 MED ORDER — WARFARIN SODIUM 5 MG PO TABS: ORAL_TABLET | ORAL | Status: DC

## 2013-08-24 MED ORDER — WARFARIN SODIUM 10 MG PO TABS
10.0000 mg | ORAL_TABLET | Freq: Once | ORAL | Status: DC
  Filled 2013-08-24: qty 1

## 2013-08-24 NOTE — Progress Notes (Signed)
DC IV, DC Tele, DC Home. Discharge instructions and home medications discussed with patient. Patient denied any questions or concerns at this time. Patient leaving unit via wheelchair and appears in no acute distress.  

## 2013-08-24 NOTE — Progress Notes (Signed)
Pt's HR 46-58 , sinus brady, asymptomatic. Paged MD on call, still awaiting. Will continue to monitor pt

## 2013-08-24 NOTE — Addendum Note (Signed)
Addendum created 08/24/13 1245 by Josephine Igo, CRNA   Modules edited: Anesthesia Medication Administration

## 2013-08-24 NOTE — Consult Note (Addendum)
ANTICOAGULATION CONSULT NOTE - Follow up  Pharmacy Consult for Coumadin / Heparin bridge-->Lovenox Indication: afib, aflutter  Allergies  Allergen Reactions  . Horse-Derived Products Other (See Comments)    Based on skin test reaction  . Other Other (See Comments)    Tetanus Shot -- skin test reaction  . Sunflower Seed [Sunflower Oil] Swelling    Tongue and lip swelling    Patient Measurements: Height: 5\' 11"  (180.3 cm) Weight: 190 lb 7.6 oz (86.4 kg) IBW/kg (Calculated) : 75.3 Heparin Dosing Weight: 86.4 kg  Labs:  Recent Labs  08/21/13 1241  08/21/13 2200 08/22/13 0424 08/22/13 1023  08/23/13 0004 08/23/13 0931 08/23/13 1945 08/24/13 0520  HGB 12.5*  --   --   --   --   --  12.5*  --   --  11.2*  HCT 37.9*  --   --   --   --   --  36.8*  --   --  34.3*  PLT 213  --   --   --   --   --  228  --   --  203  LABPROT  --   < >  --  19.9*  --   --  19.3*  --   --  22.4*  INR  --   < >  --  1.69*  --   --  1.63*  --   --  1.97*  HEPARINUNFRC  --   --   --   --   --   < > <0.10* 0.20* 0.27*  --   CREATININE 0.92  --   --  0.98  --   --  1.02  --   --  1.25  TROPONINI  --   < > <0.30 <0.30 <0.30  --   --   --   --   --   < > = values in this interval not displayed.  Estimated Creatinine Clearance: 55.2 ml/min (by C-G formula based on Cr of 1.25).   Assessment: 73yo male who was on coumadin pta for afib and presented to the ED on 08/21/13 with new onset aflutter.  Heparin level increasing toward therapeutic range 0.3-0.7 after rate increased earlier today. No bleeding noted.  S/p Electrical Cardioversion today- NSR but cardiologist suspects he is back in A flutter. MD is continuing the amiodarone loading, lopressor and added diltiazem.    INR on admission subtherapeutic at 1.57 - apparently is a chronic issue for him. May change to NOAC if patient is agreeable, but coumadin to continue for now. INR today at 1.97.  Started on amiodarone load 08/22/13.  Home dose: 7.5mg  daily  except 10mg  on Sunday - last dose 8/16   Goal of Therapy:  INR 2-3 Monitor platelets by anticoagulation protocol: Yes Heparin level = 0.3-0.7   Plan:  Continue heparin at 2150 units / hr Follow up AM heparin level Repeat Coumadin 10 mg today  If home, recommend Coumadin 10 mg TTSS, 7.5 mg MWF  Thank you. Anette Guarneri, PharmD [phone] 08/24/2013,8:47 AM   Lovenox 90 mg sq x 1 dose ordered

## 2013-08-24 NOTE — Discharge Summary (Signed)
Patient ID: [name],  MRN: [id number], DOB/AGE: [birth year] 72 y.o.  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 He has HTN cardiomyopathy with severe LVH and NL LVF by echo April 2015. He failed Tikosyn fall of 2014. He underwent CABG with Maze and LA ligation 04/24/13. He was put on Amiodarone pre CABG. Amiodarone was discontinued in July as he was holding NSR post op. He has been reluctant to take NOAC secondary to fear of bleeding problems. He is on Coumadin but is chronically sub therapeutic. He presented 08/21/13 with dyspnea, CHF, and A flutter with RVR. Amiodarone was resumed with loading and he underwent TEE CV 8/19 to NSR. There was reported tachycardia to 140 later on the 19th after his cardioversion and we suspected he may have had recurrent atrial flutter but we were unable to locate any telemetry strip showing this. We added low dose Diltiazem as he seemed to respond well to this on admission. EKG on the am of discharge shows NSR 59 with 1st AVB, PR 2.4. Dr Marlou Porch feels we can discharge him today. His INR is 1.97 but has been trending up. We'll give him a dose of Lovenox this am and then discharge him later this morning. He will continue Amiodarone 400 mg BID till 8/24, then cut back to 200 mg BID till he sees Dr Acie Fredrickson in  clinic 8/26. He has been instructed to get his INR checked Monday with Dr Melford Aase We stressed the importance of keeping an INR > 2 post cardioversion.    Discharge Vitals:  Blood pressure 116/52, pulse 55, temperature 98.3 F (36.8 C), temperature source Oral, resp. rate 20, height 5\' 11"  (1.803 m), weight 192 lb 9.6 oz (87.363 kg), SpO2 98.00%.    Labs: Results for orders placed during the hospital encounter of 08/21/13 (from the past 24 hour(s))  HEPARIN LEVEL (UNFRACTIONATED)     Status: Abnormal   Collection Time    08/23/13  9:31 AM      Result Value Ref Range   Heparin Unfractionated 0.20 (*) 0.30 - 0.70 IU/mL  GLUCOSE, CAPILLARY     Status: Abnormal   Collection Time    08/23/13 11:12 AM      Result Value Ref Range   Glucose-Capillary 168 (*) 70 - 99 mg/dL   Comment 1 Documented in Chart     Comment 2 Notify RN    GLUCOSE, CAPILLARY     Status: Abnormal   Collection Time    08/23/13  2:08 PM      Result Value Ref Range   Glucose-Capillary 136 (*) 70 - 99 mg/dL   Comment 1 Notify RN    GLUCOSE, CAPILLARY     Status: Abnormal   Collection Time    08/23/13  4:20 PM      Result Value  Ref Range   Glucose-Capillary 287 (*) 70 - 99 mg/dL   Comment 1 Documented in Chart     Comment 2 Notify RN    HEPARIN LEVEL (UNFRACTIONATED)     Status: Abnormal   Collection Time    08/23/13  7:45 PM      Result Value Ref Range   Heparin Unfractionated 0.27 (*) 0.30 - 0.70 IU/mL  GLUCOSE, CAPILLARY     Status: Abnormal   Collection Time    08/23/13  8:59 PM      Result Value Ref Range   Glucose-Capillary 121 (*) 70 - 99 mg/dL   Comment 1 Notify RN     Comment 2 Documented in Chart    BASIC METABOLIC PANEL     Status: Abnormal   Collection Time    08/24/13  5:20 AM      Result Value Ref Range   Sodium 140  137 - 147 mEq/L   Potassium 4.2  3.7 - 5.3 mEq/L   Chloride 104  96 - 112 mEq/L   CO2 26  19 - 32 mEq/L   Glucose, Bld 149 (*) 70 - 99 mg/dL   BUN 28 (*) 6 - 23 mg/dL    Creatinine, Ser 1.25  0.50 - 1.35 mg/dL   Calcium 9.3  8.4 - 10.5 mg/dL   GFR calc non Af Amer 55 (*) >90 mL/min   GFR calc Af Amer 64 (*) >90 mL/min   Anion gap 10  5 - 15  PROTIME-INR     Status: Abnormal   Collection Time    08/24/13  5:20 AM      Result Value Ref Range   Prothrombin Time 22.4 (*) 11.6 - 15.2 seconds   INR 1.97 (*) 0.00 - 1.49  CBC     Status: Abnormal   Collection Time    08/24/13  5:20 AM      Result Value Ref Range   WBC 7.3  4.0 - 10.5 K/uL   RBC 3.73 (*) 4.22 - 5.81 MIL/uL   Hemoglobin 11.2 (*) 13.0 - 17.0 g/dL   HCT 34.3 (*) 39.0 - 52.0 %   MCV 92.0  78.0 - 100.0 fL   MCH 30.0  26.0 - 34.0 pg   MCHC 32.7  30.0 - 36.0 g/dL   RDW 16.8 (*) 11.5 - 15.5 %   Platelets 203  150 - 400 K/uL  GLUCOSE, CAPILLARY     Status: Abnormal   Collection Time    08/24/13  6:01 AM      Result Value Ref Range   Glucose-Capillary 144 (*) 70 - 99 mg/dL   Comment 1 Notify RN     Comment 2 Documented in Chart      Disposition:  Follow-up Information   Follow up with Darden Amber., MD On 08/30/2013. (11:15)    Specialty:  Cardiology   Contact information:   [address] [place] [address] [phone]       Discharge Medications:    Medication List    STOP taking these medications       amLODipine 10 MG tablet  Commonly known as:  NORVASC      TAKE these medications       acetaminophen 500 MG tablet  Commonly known as:  TYLENOL  Take 1,000 mg by mouth every 6 (six) hours as needed for mild pain.     ALPRAZolam 0.25 MG tablet  Commonly known as:  XANAX  Take  0.25 mg by mouth at bedtime as needed for sleep.     amiodarone 200 MG tablet  Commonly known as:  PACERONE  Take 400 mg twice a day through 08/28/13- then start 200 mg twice a day on 8/25 till seen by Dr Acie Fredrickson.     BESIVANCE 0.6 % Susp  Generic drug:  Besifloxacin HCl  Place 1 drop into the left eye 4 (four) times daily as needed (after injection).     cloNIDine 0.2 MG  tablet  Commonly known as:  CATAPRES  Take 0.1 mg by mouth 2 (two) times daily.     diltiazem 120 MG 24 hr capsule  Commonly known as:  CARDIZEM CD  Take 1 capsule (120 mg total) by mouth daily.     diphenhydrAMINE 25 MG tablet  Commonly known as:  BENADRYL  Take 25 mg by mouth every 6 (six) hours as needed for allergies.     furosemide 40 MG tablet  Commonly known as:  LASIX  Take 40 mg by mouth 2 (two) times daily. Takes one tab daily, but sometimes takes extra tab as needed for swelling     glimepiride 2 MG tablet  Commonly known as:  AMARYL  Take 2 mg by mouth daily with breakfast.     KLOR-CON M20 20 MEQ tablet  Generic drug:  potassium chloride SA  Take 20 mEq by mouth 3 (three) times daily.     LIPITOR 20 MG tablet  Generic drug:  atorvastatin  Take 20 mg by mouth 3 (three) times a week. Monday Wednesday and Friday     metFORMIN 500 MG 24 hr tablet  Commonly known as:  GLUCOPHAGE-XR  Take 1,000 mg by mouth 2 (two) times daily before a meal.     metoprolol tartrate 25 MG tablet  Commonly known as:  LOPRESSOR  Take 1 tablet (25 mg total) by mouth 2 (two) times daily.     minoxidil 10 MG tablet  Commonly known as:  LONITEN  Take 10 mg by mouth daily.     ranitidine 150 MG tablet  Commonly known as:  ZANTAC  Take 150 mg by mouth daily as needed for heartburn.     sertraline 100 MG tablet  Commonly known as:  ZOLOFT  Take 100 mg by mouth daily.     valsartan 320 MG tablet  Commonly known as:  DIOVAN  Take 160 mg by mouth daily.     VICODIN 5-500 MG per tablet  Generic drug:  HYDROcodone-acetaminophen  Take 1 tablet by mouth every 6 (six) hours as needed for pain.     warfarin 5 MG tablet  Commonly known as:  COUMADIN  Take 7.5 mg MWF, 10 mg other days         Duration of Discharge Encounter: Greater than 30 minutes including physician time.  Angelena Form PA-C 08/24/2013 9:13 AM

## 2013-08-24 NOTE — Progress Notes (Addendum)
Subjective:  No complaints this am- he wants to go home today.  Objective:  Vital Signs in the last 24 hours: Temp:  [98.2 F (36.8 C)-98.6 F (37 C)] 98.3 F (36.8 C) (08/20 0720) Pulse Rate:  [55-87] 55 (08/20 0720) Resp:  [12-26] 20 (08/20 0720) BP: (116-166)/(52-91) 116/52 mmHg (08/20 0720) SpO2:  [91 %-100 %] 98 % (08/20 0720) Weight:  [192 lb 9.6 oz (87.363 kg)] 192 lb 9.6 oz (87.363 kg) (08/20 0720)  Intake/Output from previous day:  Intake/Output Summary (Last 24 hours) at 08/24/13 0750 Last data filed at 08/24/13 0659  Gross per 24 hour  Intake   1158 ml  Output    860 ml  Net    298 ml    Physical Exam: General appearance: alert, cooperative and no distress Lungs: clear to auscultation bilaterally Heart: regular rate and rhythm   Rate: 60  Rhythm: normal sinus rhythm and sinus bradycardia  Lab Results:  Recent Labs  08/23/13 0004 08/24/13 0520  WBC 7.8 7.3  HGB 12.5* 11.2*  PLT 228 203    Recent Labs  08/23/13 0004 08/24/13 0520  NA 139 140  K 3.9 4.2  CL 101 104  CO2 24 26  GLUCOSE 153* 149*  BUN 24* 28*  CREATININE 1.02 1.25    Recent Labs  08/22/13 0424 08/22/13 1023  TROPONINI <0.30 <0.30    Recent Labs  08/24/13 0520  INR 1.97*    Imaging: Imaging results have been reviewed  Cardiac Studies:  Assessment/Plan:  73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 He has HTN cardiomyopathy with severe LVH and NL LVF by echo April 2015. He failed Tikosyn fall of 2014. He underwent CABG with Maze and LA ligation 04/24/13. He was put on Amiodarone pre CABG. Amiodarone was discontinued in July as he was holding NSR post op. He has been reluctant to take NOAC secondary to fear of bleeding problems. He is on Coumadin but is chronically sub therapeutic. He presented 08/21/13 with dyspnea, CHF, and A flutter with RVR. Amiodarone was resumed and he underwent  TEE CV 8/19 to NSR.    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 I suspected he was back in A flutter and I added low dose Diltiazem.  This am I could find no strips showing a HR of 140. Document EKG this am- INR is still not therapeutic and he has had "uncomfortable reaction to Lovenox- injection site pain". I briefly discussed trying a NOAC and I think he may be willing to give this another try. He said he bled freely on Eliquis in the past.   Kerin Ransom PA-C Beeper [id number] 08/24/2013, 7:50 AM  Scheduled Meds: . amiodarone  400 mg Oral BID  . atorvastatin  20 mg Oral Once per day on Mon Wed Fri  . cloNIDine  0.1 mg Oral BID  . diltiazem  120 mg Oral Daily  . furosemide  40 mg Oral Daily  . glimepiride  2 mg Oral Q breakfast  . insulin aspart  0-9 Units Subcutaneous TID WC  . irbesartan  150 mg Oral Daily  . metoprolol tartrate  25 mg Oral BID  . minoxidil  10 mg Oral Daily  . potassium chloride SA  20 mEq Oral TID  . sertraline  100 mg Oral Daily  . sodium chloride  3 mL Intravenous Q12H  . Warfarin - Pharmacist Dosing Inpatient   Does not apply q1800   Continuous Infusions: . heparin 2,150 Units/hr (08/24/13 0357)   PRN Meds:.sodium chloride, acetaminophen, ALPRAZolam, diphenhydrAMINE, famotidine, fentaNYL, HYDROcodone-acetaminophen, ondansetron (ZOFRAN) IV, sodium chloride   Personally seen and examined. Agree with above. I think that it is reasonable to send him home today after injection of Lovenox given the trajectory of his INR increasing. He states  that his primary physician checks his PT/INR and he will be seeing him on Monday. He has had a problem in the past of maintaining therapeutic INRs. Stressed the importance of this. He is very eager to go home. I offered him an extra day in the hospital with continued heparin however he truly does not want to pursue this. He seems fine with the injection of Lovenox to cover him.  I agree with Lurena Joiner, I do not see any evidence of tachycardia on telemetry. Could he have been back in a brief period atrial flutter?. Agree with low-dose diltiazem. Currently in sinus rhythm.  EKG performed this morning does show sinus bradycardia rate 59 with first degree AV block approximately 220-240 ms. He responded quite well to diltiazem previously and we will continue this with his low-dose metoprolol. We will need to be careful with AV nodal blocking agents, increasing with his first degree AV block. If he shows signs of worsening bradycardia, we will need to change our medication strategy.  Candee Furbish, MD

## 2013-08-24 NOTE — Discharge Instructions (Signed)
Atrial Fibrillation °Atrial fibrillation is a type of irregular heart rhythm (arrhythmia). During atrial fibrillation, the upper chambers of the heart (atria) quiver continuously in a chaotic pattern. This causes an irregular and often rapid heart rate.  °Atrial fibrillation is the result of the heart becoming overloaded with disorganized signals that tell it to beat. These signals are normally released one at a time by a part of the right atrium called the sinoatrial node. They then travel from the atria to the lower chambers of the heart (ventricles), causing the atria and ventricles to contract and pump blood as they pass. In atrial fibrillation, parts of the atria outside of the sinoatrial node also release these signals. This results in two problems. First, the atria receive so many signals that they do not have time to fully contract. Second, the ventricles, which can only receive one signal at a time, beat irregularly and out of rhythm with the atria.  °There are three types of atrial fibrillation:  °· Paroxysmal. Paroxysmal atrial fibrillation starts suddenly and stops on its own within a week. °· Persistent. Persistent atrial fibrillation lasts for more than a week. It may stop on its own or with treatment. °· Permanent. Permanent atrial fibrillation does not go away. Episodes of atrial fibrillation may lead to permanent atrial fibrillation. °Atrial fibrillation can prevent your heart from pumping blood normally. It increases your risk of stroke and can lead to heart failure.  °CAUSES  °· Heart conditions, including a heart attack, heart failure, coronary artery disease, and heart valve conditions.   °· Inflammation of the sac that surrounds the heart (pericarditis). °· Blockage of an artery in the lungs (pulmonary embolism). °· Pneumonia or other infections. °· Chronic lung disease. °· Thyroid problems, especially if the thyroid is overactive (hyperthyroidism). °· Caffeine, excessive alcohol use, and use  of some illegal drugs.   °· Use of some medicines, including certain decongestants and diet pills. °· Heart surgery.   °· Birth defects.   °Sometimes, no cause can be found. When this happens, the atrial fibrillation is called lone atrial fibrillation. The risk of complications from atrial fibrillation increases if you have lone atrial fibrillation and you are age 60 years or older. °RISK FACTORS °· Heart failure. °· Coronary artery disease. °· Diabetes mellitus.   °· High blood pressure (hypertension).   °· Obesity.   °· Other arrhythmias.   °· Increased age. °SIGNS AND SYMPTOMS  °· A feeling that your heart is beating rapidly or irregularly.   °· A feeling of discomfort or pain in your chest.   °· Shortness of breath.   °· Sudden light-headedness or weakness.   °· Getting tired easily when exercising.   °· Urinating more often than normal (mainly when atrial fibrillation first begins).   °In paroxysmal atrial fibrillation, symptoms may start and suddenly stop. °DIAGNOSIS  °Your health care provider may be able to detect atrial fibrillation when taking your pulse. Your health care provider may have you take a test called an ambulatory electrocardiogram (ECG). An ECG records your heartbeat patterns over a 24-hour period. You may also have other tests, such as: °· Transthoracic echocardiogram (TTE). During echocardiography, sound waves are used to evaluate how blood flows through your heart. °· Transesophageal echocardiogram (TEE). °· Stress test. There is more than one type of stress test. If a stress test is needed, ask your health care provider about which type is best for you. °· Chest X-ray exam. °· Blood tests. °· Computed tomography (CT). °TREATMENT  °Treatment may include: °· Treating any underlying conditions. For example, if you   have an overactive thyroid, treating the condition may correct atrial fibrillation.  Taking medicine. Medicines may be given to control a rapid heart rate or to prevent blood  clots, heart failure, or a stroke.  Having a procedure to correct the rhythm of the heart:  Electrical cardioversion. During electrical cardioversion, a controlled, low-energy shock is delivered to the heart through your skin. If you have chest pain, very low blood pressure, or sudden heart failure, this procedure may need to be done as an emergency.  Catheter ablation. During this procedure, heart tissues that send the signals that cause atrial fibrillation are destroyed.  Surgical ablation. During this surgery, thin lines of heart tissue that carry the abnormal signals are destroyed. This procedure can either be an open-heart surgery or a minimally invasive surgery. With the minimally invasive surgery, small cuts are made to access the heart instead of a large opening.  Pulmonary venous isolation. During this surgery, tissue around the veins that carry blood from the lungs (pulmonary veins) is destroyed. This tissue is thought to carry the abnormal signals. HOME CARE INSTRUCTIONS   Take medicines only as directed by your health care provider. Some medicines can make atrial fibrillation worse or recur.  If blood thinners were prescribed by your health care provider, take them exactly as directed. Too much blood-thinning medicine can cause bleeding. If you take too little, you will not have the needed protection against stroke and other problems.  Perform blood tests at home if directed by your health care provider. Perform blood tests exactly as directed.  Quit smoking if you smoke.  Do not drink alcohol.  Do not drink caffeinated beverages such as coffee, soda, and some teas. You may drink decaffeinated coffee, soda, or tea.   Maintain a healthy weight.Do not use diet pills unless your health care provider approves. They may make heart problems worse.   Follow diet instructions as directed by your health care provider.  Exercise regularly as directed by your health care  provider.  Keep all follow-up visits as directed by your health care provider. This is important. PREVENTION  The following substances can cause atrial fibrillation to recur:   Caffeinated beverages.  Alcohol.  Certain medicines, especially those used for breathing problems.  Certain herbs and herbal medicines, such as those containing ephedra or ginseng.  Illegal drugs, such as cocaine and amphetamines. Sometimes medicines are given to prevent atrial fibrillation from recurring. Proper treatment of any underlying condition is also important in helping prevent recurrence.  SEEK MEDICAL CARE IF:  You notice a change in the rate, rhythm, or strength of your heartbeat.  You suddenly begin urinating more frequently.  You tire more easily when exerting yourself or exercising. SEEK IMMEDIATE MEDICAL CARE IF:   You have chest pain, abdominal pain, sweating, or weakness.  You feel nauseous.  You have shortness of breath.  You suddenly have swollen feet and ankles.  You feel dizzy.  Your face or limbs feel numb or weak.  You have a change in your vision or speech. MAKE SURE YOU:   Understand these instructions.  Will watch your condition.  Will get help right away if you are not doing well or get worse. Document Released: 12/22/2004 Document Revised: 05/08/2013 Document Reviewed: 02/02/2012 [facility] Patient Information 2015 [place], [place]. This information is not intended to replace advice given to you by your health care provider. Make sure you discuss any questions you have with your health care provider.   Have INR checked Monday

## 2013-08-25 ENCOUNTER — Ambulatory Visit: Payer: Self-pay | Admitting: Physician Assistant

## 2013-08-25 NOTE — Discharge Summary (Signed)
Personally seen and examined. Agree with above. Anticoagulation followed by PCP.  Candee Furbish, MD'

## 2013-08-28 ENCOUNTER — Encounter (HOSPITAL_COMMUNITY): Payer: Medicare Other

## 2013-08-28 ENCOUNTER — Ambulatory Visit (INDEPENDENT_AMBULATORY_CARE_PROVIDER_SITE_OTHER): Payer: Medicare Other | Admitting: *Deleted

## 2013-08-28 DIAGNOSIS — Z7901 Long term (current) use of anticoagulants: Secondary | ICD-10-CM

## 2013-08-28 NOTE — Progress Notes (Signed)
Patient ID: [name], male   DOB: 1940-06-20, 73 y.o.   MRN: [id number] Patient presents for recheck PT/INR.  Currently taking 7.5 mg Coumadin 3 days a week and 10 mg Coumadin 4 days per week.

## 2013-08-29 LAB — PROTIME-INR
INR: 3.32 — ABNORMAL HIGH (ref <1.50)
Prothrombin Time: 33.7 seconds — ABNORMAL HIGH (ref 11.6–15.2)

## 2013-08-30 ENCOUNTER — Encounter: Payer: Self-pay | Admitting: Cardiovascular Disease

## 2013-08-30 ENCOUNTER — Encounter (HOSPITAL_COMMUNITY): Payer: Medicare Other

## 2013-08-30 ENCOUNTER — Ambulatory Visit (INDEPENDENT_AMBULATORY_CARE_PROVIDER_SITE_OTHER): Payer: Medicare Other | Admitting: Cardiovascular Disease

## 2013-08-30 VITALS — BP 138/76 | HR 49 | Ht 71.0 in | Wt 196.0 lb

## 2013-08-30 DIAGNOSIS — I483 Typical atrial flutter: Secondary | ICD-10-CM

## 2013-08-30 DIAGNOSIS — I480 Paroxysmal atrial fibrillation: Secondary | ICD-10-CM

## 2013-08-30 DIAGNOSIS — I4891 Unspecified atrial fibrillation: Secondary | ICD-10-CM

## 2013-08-30 DIAGNOSIS — I4892 Unspecified atrial flutter: Secondary | ICD-10-CM

## 2013-08-30 MED ORDER — AMIODARONE HCL 200 MG PO TABS: 200.0000 mg | ORAL_TABLET | Freq: Two times a day (BID) | ORAL | Status: DC

## 2013-08-30 NOTE — Progress Notes (Signed)
[name] Date of Birth  Aug 22, 1940 [facility] [address]    [place], [place]  [address] [phone]  Fax  [phone]  Problem list: 1. Hypertension 2. HOCM 3. Aortic insuffuciency 4. Atrial fibrillation-failed Tikosyn,   Has seen [facility] in [place], [place] .   5. Diabetes mellitus 6. 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 He has a hyperdynamic LV with systolic anterior motion of the mitral valve leaflet. 8. CABG and MAZE 04-05-13  History of Present Illness:  [name] is a 73 year old gentleman with a history of hypertension, atrial fibrillation and a myocardial bridge.  He is a previous patient of Dr. Susa Simmonds. He also has a history of diabetes. He has felt fairly well. He has not had any episodes of chest pain or shortness of breath.    Nov. 25, 2013: He is very symptomatic with his Atrial fib.  He gets very winded with walking.  He has been on Eliquis and tikosyn 500 BID.  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  He was diagnosed with panic attack.  Enzymes were normal.  He is ready for his cardioversion.  Nov. 27, 2013: [name] was seen 2 days ago and was found to have atrial fibrillation. He was scheduled for cardioversion. When he showed up for cardioversion yesterday, he was in normal sinus rhythm.  He related to me that he had some medication changes over the past several weeks. He was previously on Tikosyn and citalopram and his atrial fibrillation seemed to be well-controlled on that.  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  It was at that time that he started having increased palpitations, anxiety, and we noted him to be in atrial fibrillation. He then discontinued the Wellbutrin and was started on Zoloft.  He seems to be tolerating this combination. It was at that time that he showed up in the short  stay Center and was found to have already converted to sinus rhythm.  May 31, 2012:  [name] is doing well.  Still in NSR.  No leg edema.  He sticks to a low sodium diet.   He has occasional CP - typically associated with arm / chest movement.   He wants to stop coumadin - interferes with his arthritis medications.  He has maintained NSR since his last visit.    Nov. 24, 2014:  [name] is doing OK.  He has some brief palps - he thinks they are PVCs.  No sustained episodes of Afib.   He has been getting Avastin injections into his eyes.    Jan. 12, 2015:  [name] is seen today as a work in visit for worsening ankle edema.   He has not been able to associate his symptoms with change of diet.  .  he has also been noticing some increasing shortness of breath.  He notes short or shortness of breath with any activity-example take the garbage cans out.   He denies any PND. He uses a CPAP mask.    Jan. 26, 2015:  [name] is seen back today . He had CABG and MAZE on April 05, 2013 - slow recovery following surgery Had a left pleural tap ~ 4 weeks after surgery - 1.2 liters taken off - did much better after that  Had elective cardioversioin in early August, 2015.   [name] was seen 2  weeks ago with symptoms consistent with congestive heart failure. He had been in persistent atrial fibrillation and so his tikosyn  was stopped.  He is still in  atrial fibrillation. His rate is well controlled.        He had an echocardiogram: Left ventricle: Wall thickness was increased in a pattern of moderate LVH. Systolic function was vigorous. The estimated ejection fraction was in the range of 75% to 80%. Wall motion was normal; there were no regional wall motion abnormalities. - Aortic valve: Trivial regurgitation. - Left atrium: The atrium was mildly to moderately dilated. - Right ventricle: The cavity size was mildly dilated. Systolic function was mildly reduced. - Right atrium: The atrium was moderately  dilated. - Pulmonary arteries: Systolic pressure was mildly increased. PA peak pressure: 67mm Hg  He was found to have mild pulmonary hypertension.   He continues to have some dyspnea.  He has ankle edema.  His BP log shows mildly elevated BP.  May 26, 2013: [name] has had 2 V CABG and MAZE  (April 05, 2013)  since I last saw him.   . He has done well He is having some tenderness along the right thigh SVG harvest site.  Also c/o left 4th and 5th finger tingling / numbness.    He has otherwise done well.  No cardiac complaints.    August 30, 2013:  He was cardioverted several weeks ago.  After the cardioversion, his metoprolol was doubled to 25 BID, and diltizem was added    Current Outpatient Prescriptions on File Prior to Visit  Medication Sig Dispense Refill  . acetaminophen (TYLENOL) 500 MG tablet Take 1,000 mg by mouth every 6 (six) hours as needed for mild pain.       [name] ALPRAZolam (XANAX) 0.25 MG tablet Take 0.25 mg by mouth at bedtime as needed for sleep.       [name] amiodarone (PACERONE) 200 MG tablet Take 400 mg twice a day through 08/28/13- then start 200 mg twice a day on 8/25 till seen by Dr Acie Fredrickson.  60 tablet  5  . atorvastatin (LIPITOR) 20 MG tablet Take 20 mg by mouth 3 (three) times a week. Monday Wednesday and Friday      . BESIVANCE 0.6 % SUSP Place 1 drop into the left eye 4 (four) times daily as needed (after injection).       . cloNIDine (CATAPRES) 0.2 MG tablet Take 0.1 mg by mouth 2 (two) times daily.       [name] diltiazem (CARDIZEM CD) 120 MG 24 hr capsule Take 1 capsule (120 mg total) by mouth daily.  30 capsule  11  . diphenhydrAMINE (BENADRYL) 25 MG tablet Take 25 mg by mouth every 6 (six) hours as needed for allergies.       . furosemide (LASIX) 40 MG tablet Take 40 mg by mouth 2 (two) times daily. Takes one tab daily, but sometimes takes extra tab as needed for swelling      . glimepiride (AMARYL) 2 MG tablet Take 2 mg by mouth daily with breakfast.      .  HYDROcodone-acetaminophen (VICODIN) 5-500 MG per tablet Take 1 tablet by mouth every 6 (six) hours as needed for pain.      [name] KLOR-CON M20 20 MEQ tablet Take 20 mEq by mouth 3 (three) times daily.       . metFORMIN (GLUCOPHAGE-XR) 500 MG 24 hr tablet Take 1,000 mg by mouth 2 (two) times daily before a meal.       .  metoprolol tartrate (LOPRESSOR) 25 MG tablet Take 1 tablet (25 mg total) by mouth 2 (two) times daily.  60 tablet  11  . minoxidil (LONITEN) 10 MG tablet Take 10 mg by mouth daily.      . ranitidine (ZANTAC) 150 MG tablet Take 150 mg by mouth daily as needed for heartburn.       . sertraline (ZOLOFT) 100 MG tablet Take 100 mg by mouth daily.      . valsartan (DIOVAN) 320 MG tablet Take 160 mg by mouth daily.       No current facility-administered medications on file prior to visit.    Allergies  Allergen Reactions  . Horse-Derived Products Other (See Comments)    Based on skin test reaction  . Other Other (See Comments)    Tetanus Shot -- skin test reaction  . Sunflower Seed [Sunflower Oil] Swelling    Tongue and lip swelling    Past Medical History  Diagnosis Date  . Hx of adenomatous colonic polyps   . Diabetes mellitus type II   . Hypertension   . Diverticulosis 2001  . Hyperlipidemia   . Persistent atrial fibrillation     a. on amio and coumadin;  b. s/p MAZE 04/2013 in setting of CABG.  . Obstructive sleep apnea     compliant with CPAP  . Chronic diastolic congestive heart failure   . Hypertensive cardiomyopathy   . History of cardioversion     x3 (years uncertain)  . H/O hiatal hernia   . Cancer     Basal cell carcinoma  . Adrenal adenoma   . CAD (coronary artery disease)     a. 04/2013 CABG x 2: LIMA to LAD, SVG to RI, EVH via R thigh.  . S/P Maze operation for atrial fibrillation     a. 04/2013: Complete bilateral atrial lesion set using cryothermy and bipolar radiofrequency ablation with clipping of LA appendage (@ time of CABG)  . Type II or unspecified  type diabetes mellitus without mention of complication, not stated as uncontrolled   . GERD (gastroesophageal reflux disease)   . Depression   . Anxiety   . DJD (degenerative joint disease)   . Atrial flutter 2015    Past Surgical History  Procedure Laterality Date  . Polinydal cyst      Removed  . Great toe arthrodesis, interphalangeal joint      Right foot  . Retina repair-right    . Cataract extraction      bilateral  . Basal cell carcinoma excision      x3 on face  . Polypectomy    . Cardiac catheterization      myocardial bridge but no cad  . Coronary artery bypass graft N/A 04/05/2013    Procedure: CORONARY ARTERY BYPASS GRAFTING (CABG) TIMES TWO USING LEFT INTERNAL MAMMARY ARTERY AND RIGHT SAPHENOUS LEG VEIN HARVESTED ENDOSCOPICALLY;  Surgeon: Rexene Alberts, MD;  Location: [facility];  Service: Open Heart Surgery;  Laterality: N/A;  . Maze N/A 04/05/2013    Procedure: MAZE;  Surgeon: Rexene Alberts, MD;  Location: [facility];  Service: Open Heart Surgery;  Laterality: N/A;  . Intraoperative transesophageal echocardiogram N/A 04/05/2013    Procedure: INTRAOPERATIVE TRANSESOPHAGEAL ECHOCARDIOGRAM;  Surgeon: Rexene Alberts, MD;  Location: [facility];  Service: Open Heart Surgery;  Laterality: N/A;  . Tee without cardioversion N/A 08/23/2013    Procedure: TRANSESOPHAGEAL ECHOCARDIOGRAM (TEE);  Surgeon: Sanda Klein, MD;  Location: [facility];  Service: Cardiovascular;  Laterality:  N/A;  . Cardioversion N/A 08/23/2013    Procedure: CARDIOVERSION;  Surgeon: Sanda Klein, MD;  Location: [facility];  Service: Cardiovascular;  Laterality: N/A;    History  Smoking status  . Former Smoker -- 4.00 packs/day  . Types: Cigarettes  . Quit date: 01/05/1981  Smokeless tobacco  . Never Used    Comment: Stopped 1983    History  Alcohol Use  . Yes    Comment: 1-5 drinks per week    Family History  Problem Relation Age of Onset  . Colon cancer Mother     Family History/Uncle   . Colon  polyps Mother     Family History  . Atrial fibrillation Mother   . Hypertension Mother   . Colon polyps Sister     Family history  . Diabetes Maternal Uncle   . Stroke Paternal Uncle   . Dementia Father     Reviw of Systems:  Reviewed in the HPI.  All other systems are negative.  Physical Exam: BP 138/76  Pulse 49  Ht 5\' 11"  (1.803 m)  Wt 196 lb (88.905 kg)  BMI 27.35 kg/m2 The patient is alert and oriented x 3.  The mood and affect are normal.   Skin: warm and dry.  Color is normal.    HEENT:   the sclera are nonicteric.  The mucous membranes are moist.  The carotids are 2+ without bruits.  There is no thyromegaly.  There is no JVD.   Lungs: clear.  The chest wall is non tender.   Heart: RR.   with a normal S1 and S2.  He has a hyperdynamic PMI. He has a very soft but brief 2/6 systolic murmur at the left cardiac  border. 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 Abdomen: good bowel sounds.  There is no guarding or rebound.  There is no hepatosplenomegaly or tenderness.  There are no masses.  Extremities:  no clubbing, cyanosis, or edema.  The legs are without rashes.  The distal pulses are intact.  Neuro:  Cranial nerves II - XII are intact.  Motor and sensory functions are intact.   The gait is normal.  ECG: August 30, 2013:  Sinus bradycardia at 49.    Assessment / Plan:

## 2013-08-30 NOTE — Assessment & Plan Note (Signed)
Remains in   sinus rhythm - sinus bradycardia with a very slow heart rate. I think that this hasn't somewhat fatigued. He thought that he may be back in atrial fibrillation that we are able to see P waves on his EKG today.  We will decrease his amiodarone to 200 mg twice a day. We'll continue the current dose of metoprolol but discontinued the diltiazem. Hopefully his heart rate will increase up to the 60 range.  I'll see him back in one month. At that time I anticipate further decreasing the amiodarone to 200 mg a day.  He's had a very rough and slow recovery following his bypass surgery and maze procedure. I've encouraged him to get back out and exercise. At present he still too winded to exercise and gets short of breath and fatigue going out to get the newspaper. We will send back to cardiac rehabilitation once he is made some progress and is feeling a little bit more like exercising.

## 2013-08-30 NOTE — Patient Instructions (Signed)
Your physician has recommended you make the following change in your medication:  Stop Diltiazem  Decrease Amiodarone to 200 mg twice daily    Your physician recommends that you schedule a follow-up appointment in:  1 month with Dr. Acie Fredrickson

## 2013-08-31 ENCOUNTER — Other Ambulatory Visit: Payer: Self-pay | Admitting: Cardiovascular Disease

## 2013-09-04 ENCOUNTER — Encounter (HOSPITAL_COMMUNITY): Payer: Medicare Other

## 2013-09-04 ENCOUNTER — Ambulatory Visit (INDEPENDENT_AMBULATORY_CARE_PROVIDER_SITE_OTHER): Payer: Medicare Other | Admitting: *Deleted

## 2013-09-04 DIAGNOSIS — Z7901 Long term (current) use of anticoagulants: Secondary | ICD-10-CM

## 2013-09-04 NOTE — Progress Notes (Signed)
Patient ID: BLONG PIKE, male   DOB: 1940-01-14, 73 y.o.   MRN: [id number] Patient presents for recheck PT/INR.  Patient currently taking 7.5 mg Coumadin QD.

## 2013-09-05 ENCOUNTER — Other Ambulatory Visit: Payer: Self-pay | Admitting: Internal Medicine

## 2013-09-05 LAB — PROTIME-INR
INR: 1.73 — AB (ref <1.50)
Prothrombin Time: 20.3 seconds — ABNORMAL HIGH (ref 11.6–15.2)

## 2013-09-06 ENCOUNTER — Ambulatory Visit: Payer: Self-pay

## 2013-09-06 ENCOUNTER — Encounter (HOSPITAL_COMMUNITY): Payer: Medicare Other

## 2013-09-07 ENCOUNTER — Telehealth (HOSPITAL_COMMUNITY): Payer: Self-pay | Admitting: *Deleted

## 2013-09-07 NOTE — Telephone Encounter (Signed)
Checking in with pt to see how he is feeling and when he felt he was ready to return to rehab.  Contact information given.

## 2013-09-08 ENCOUNTER — Telehealth (HOSPITAL_COMMUNITY): Payer: Self-pay | Admitting: *Deleted

## 2013-09-08 NOTE — Telephone Encounter (Signed)
Message copied by Rowe Pavy on Fri Sep 08, 2013  3:30 PM ------      Message from: Thayer Headings      Created: Fri Sep 08, 2013  9:08 AM      Regarding: RE: ok to return to rehab       [name] may start back in cardiac rehab at this point since he feels that he is ready.            [name],                   ----- Message -----         From: Rowe Pavy, RN         Sent: 09/08/2013   8:46 AM           To: Thayer Headings, MD      Subject: ok to return to rehab                                          Dr. Cathie Olden,            Pt called and expressed his desire to return to cardiac rehab next Wednesday. I reviewed your office note from 8/26.  Pt feels he is ready to return to finish his participation.  His end date is 9/25.            Thanks for your input, we truly enjoy working with him!            Carlette       ------

## 2013-09-12 ENCOUNTER — Encounter (INDEPENDENT_AMBULATORY_CARE_PROVIDER_SITE_OTHER): Payer: Medicare Other | Admitting: Ophthalmology

## 2013-09-12 DIAGNOSIS — H43819 Vitreous degeneration, unspecified eye: Secondary | ICD-10-CM

## 2013-09-12 DIAGNOSIS — H348392 Tributary (branch) retinal vein occlusion, unspecified eye, stable: Secondary | ICD-10-CM

## 2013-09-12 DIAGNOSIS — H35039 Hypertensive retinopathy, unspecified eye: Secondary | ICD-10-CM

## 2013-09-12 DIAGNOSIS — H33009 Unspecified retinal detachment with retinal break, unspecified eye: Secondary | ICD-10-CM

## 2013-09-12 DIAGNOSIS — I10 Essential (primary) hypertension: Secondary | ICD-10-CM

## 2013-09-13 ENCOUNTER — Encounter (HOSPITAL_COMMUNITY)
Admission: RE | Admit: 2013-09-13 | Discharge: 2013-09-13 | Disposition: A | Discharge status: Home / Self Care | Payer: Medicare Other | Source: Ambulatory Visit | Attending: Cardiovascular Disease | Admitting: Cardiovascular Disease

## 2013-09-13 DIAGNOSIS — Z5189 Encounter for other specified aftercare: Secondary | ICD-10-CM | POA: Insufficient documentation

## 2013-09-13 DIAGNOSIS — I10 Essential (primary) hypertension: Secondary | ICD-10-CM | POA: Insufficient documentation

## 2013-09-13 DIAGNOSIS — E785 Hyperlipidemia, unspecified: Secondary | ICD-10-CM | POA: Diagnosis not present

## 2013-09-13 DIAGNOSIS — I4891 Unspecified atrial fibrillation: Secondary | ICD-10-CM | POA: Insufficient documentation

## 2013-09-13 DIAGNOSIS — Z951 Presence of aortocoronary bypass graft: Secondary | ICD-10-CM | POA: Insufficient documentation

## 2013-09-13 DIAGNOSIS — E119 Type 2 diabetes mellitus without complications: Secondary | ICD-10-CM | POA: Diagnosis not present

## 2013-09-13 DIAGNOSIS — I2510 Atherosclerotic heart disease of native coronary artery without angina pectoris: Secondary | ICD-10-CM | POA: Diagnosis not present

## 2013-09-13 LAB — GLUCOSE, CAPILLARY: GLUCOSE-CAPILLARY: 175 mg/dL — AB (ref 70–99)

## 2013-09-18 ENCOUNTER — Telehealth: Payer: Self-pay | Admitting: Cardiovascular Disease

## 2013-09-18 ENCOUNTER — Other Ambulatory Visit: Payer: Self-pay | Admitting: Cardiovascular Disease

## 2013-09-18 ENCOUNTER — Encounter (HOSPITAL_COMMUNITY)
Admission: RE | Admit: 2013-09-18 | Discharge: 2013-09-18 | Disposition: A | Discharge status: Home / Self Care | Payer: Medicare Other | Source: Ambulatory Visit | Attending: Cardiovascular Disease | Admitting: Cardiovascular Disease

## 2013-09-18 ENCOUNTER — Other Ambulatory Visit: Payer: Self-pay | Admitting: Surgical

## 2013-09-18 DIAGNOSIS — Z5189 Encounter for other specified aftercare: Secondary | ICD-10-CM | POA: Diagnosis not present

## 2013-09-18 NOTE — Telephone Encounter (Signed)
I spoke with Carletta, RN in Cardiac Rehab who states that insurance requires patients to complete their cardiac rehab program 18 weeks after start date.  I called patient who states he talked with a representative from [facility] who told him that he could file an extension.  Patient states he believes our office will receive this paperwork.  I advised him that if and when we do receive, I will have Dr. Acie Fredrickson sign and fax back and we will notify the patient.  Patient verbalized understanding and gratitude.

## 2013-09-18 NOTE — Telephone Encounter (Signed)
New message     Pt missed approx  3wks of cardiac rehab because he was in the hosp.  He want to know if we will extend his cardiac rehab order to go an extra 3 weeks to make up for the time he was out?

## 2013-09-20 ENCOUNTER — Encounter (HOSPITAL_COMMUNITY)
Admission: RE | Admit: 2013-09-20 | Discharge: 2013-09-20 | Disposition: A | Discharge status: Home / Self Care | Payer: Medicare Other | Source: Ambulatory Visit | Attending: Cardiovascular Disease | Admitting: Cardiovascular Disease

## 2013-09-20 ENCOUNTER — Ambulatory Visit (HOSPITAL_COMMUNITY)
Admission: RE | Admit: 2013-09-20 | Discharge: 2013-09-20 | Disposition: A | Discharge status: Home / Self Care | Payer: Medicare Other | Source: Ambulatory Visit | Attending: Internal Medicine | Admitting: Internal Medicine

## 2013-09-20 ENCOUNTER — Telehealth: Payer: Self-pay | Admitting: Cardiology

## 2013-09-20 DIAGNOSIS — I4892 Unspecified atrial flutter: Secondary | ICD-10-CM | POA: Insufficient documentation

## 2013-09-20 DIAGNOSIS — I4891 Unspecified atrial fibrillation: Secondary | ICD-10-CM | POA: Insufficient documentation

## 2013-09-20 NOTE — Progress Notes (Signed)
Pt returned to cardiac rehab on 09/13/13 after hospitalization for the return of Afib.  On today during exercise, it was difficult to interpret pt's rhythm.  Pulse palpated regular but difficult to see p waves. Pt placed on Zoll for further clarification.  Still unable to determine rhythm. P waves present but did not appear to correlate to the QRS completed.  Pt asymptomatic but did feel some shortness of breath on exertion.  o2 sat checked on RA - 90-93%. O2 applied at 2lncc. Stat 12 lead EKG obtained.  Unable to determine rhythm on 12 lead.  Trish, card master called.  Received immediate return of call.  Advised to contact [employer] PA.  Luke paged, returned call.  Updated with events.  Reviewed 12 lead.  Aflutter with flutter waves most notable in V1.  No further treatment warranted.  Pt may be d/c home and to see Dr. Acie Fredrickson as scheduled on 10/2.  Ok to return to rehab.  Will in basket Dr. Acie Fredrickson and advise him of today's event. Cherre Huger, BSN

## 2013-09-20 NOTE — Telephone Encounter (Signed)
Called from rehab- pt's EKG looks abnormal. I reviewed, he is in A flutter with CVR. He is not symptomatic. I suggested he continue the same medications and follow up with Dr Acie Fredrickson as scheduled Oct 2nd. He knows to call if he develops tachycardia or syncope.   Kerin Ransom PA-C 09/20/2013 12:38 PM

## 2013-09-22 ENCOUNTER — Encounter (HOSPITAL_COMMUNITY)
Admission: RE | Admit: 2013-09-22 | Discharge: 2013-09-22 | Disposition: A | Discharge status: Home / Self Care | Payer: Medicare Other | Source: Ambulatory Visit | Attending: Cardiovascular Disease | Admitting: Cardiovascular Disease

## 2013-09-22 DIAGNOSIS — Z5189 Encounter for other specified aftercare: Secondary | ICD-10-CM | POA: Diagnosis not present

## 2013-09-22 LAB — GLUCOSE, CAPILLARY: GLUCOSE-CAPILLARY: 221 mg/dL — AB (ref 70–99)

## 2013-09-25 ENCOUNTER — Encounter (HOSPITAL_COMMUNITY)
Admission: RE | Admit: 2013-09-25 | Discharge: 2013-09-25 | Disposition: A | Discharge status: Home / Self Care | Payer: Medicare Other | Source: Ambulatory Visit | Attending: Cardiovascular Disease | Admitting: Cardiovascular Disease

## 2013-09-25 DIAGNOSIS — Z5189 Encounter for other specified aftercare: Secondary | ICD-10-CM | POA: Diagnosis not present

## 2013-09-25 NOTE — Progress Notes (Signed)
Pt given copy of his rehab report for his appointment on Tuesday with his primary MD.  Pt plans to pursue possible pulmonary work up due to symptoms of sob and decrease o2 sat on exertion. Cherre Huger, BSN

## 2013-09-26 ENCOUNTER — Ambulatory Visit (INDEPENDENT_AMBULATORY_CARE_PROVIDER_SITE_OTHER): Payer: Medicare Other | Admitting: Physician Assistant

## 2013-09-26 ENCOUNTER — Encounter: Payer: Self-pay | Admitting: Physician Assistant

## 2013-09-26 ENCOUNTER — Ambulatory Visit (HOSPITAL_COMMUNITY)
Admission: RE | Admit: 2013-09-26 | Discharge: 2013-09-26 | Disposition: A | Discharge status: Home / Self Care | Payer: Medicare Other | Source: Ambulatory Visit | Attending: Physician Assistant | Admitting: Physician Assistant

## 2013-09-26 VITALS — BP 140/80 | HR 72 | Temp 97.9°F | Resp 16 | Ht 71.0 in | Wt 198.0 lb

## 2013-09-26 DIAGNOSIS — F329 Major depressive disorder, single episode, unspecified: Secondary | ICD-10-CM

## 2013-09-26 DIAGNOSIS — F419 Anxiety disorder, unspecified: Secondary | ICD-10-CM

## 2013-09-26 DIAGNOSIS — Z23 Encounter for immunization: Secondary | ICD-10-CM

## 2013-09-26 DIAGNOSIS — R0602 Shortness of breath: Secondary | ICD-10-CM | POA: Diagnosis present

## 2013-09-26 DIAGNOSIS — I5033 Acute on chronic diastolic (congestive) heart failure: Secondary | ICD-10-CM

## 2013-09-26 DIAGNOSIS — F32A Depression, unspecified: Secondary | ICD-10-CM

## 2013-09-26 DIAGNOSIS — K5730 Diverticulosis of large intestine without perforation or abscess without bleeding: Secondary | ICD-10-CM

## 2013-09-26 DIAGNOSIS — E782 Mixed hyperlipidemia: Secondary | ICD-10-CM

## 2013-09-26 DIAGNOSIS — Z789 Other specified health status: Secondary | ICD-10-CM

## 2013-09-26 DIAGNOSIS — Z0000 Encounter for general adult medical examination without abnormal findings: Secondary | ICD-10-CM

## 2013-09-26 DIAGNOSIS — I43 Cardiomyopathy in diseases classified elsewhere: Secondary | ICD-10-CM

## 2013-09-26 DIAGNOSIS — J9819 Other pulmonary collapse: Secondary | ICD-10-CM | POA: Insufficient documentation

## 2013-09-26 DIAGNOSIS — J811 Chronic pulmonary edema: Secondary | ICD-10-CM | POA: Insufficient documentation

## 2013-09-26 DIAGNOSIS — Z79899 Other long term (current) drug therapy: Secondary | ICD-10-CM

## 2013-09-26 DIAGNOSIS — K2100 Gastro-esophageal reflux disease with esophagitis, without bleeding: Secondary | ICD-10-CM

## 2013-09-26 DIAGNOSIS — J90 Pleural effusion, not elsewhere classified: Secondary | ICD-10-CM | POA: Diagnosis not present

## 2013-09-26 DIAGNOSIS — I4891 Unspecified atrial fibrillation: Secondary | ICD-10-CM

## 2013-09-26 DIAGNOSIS — I119 Hypertensive heart disease without heart failure: Secondary | ICD-10-CM

## 2013-09-26 DIAGNOSIS — E559 Vitamin D deficiency, unspecified: Secondary | ICD-10-CM

## 2013-09-26 DIAGNOSIS — Z7901 Long term (current) use of anticoagulants: Secondary | ICD-10-CM

## 2013-09-26 DIAGNOSIS — Z951 Presence of aortocoronary bypass graft: Secondary | ICD-10-CM

## 2013-09-26 DIAGNOSIS — I480 Paroxysmal atrial fibrillation: Secondary | ICD-10-CM

## 2013-09-26 DIAGNOSIS — E1129 Type 2 diabetes mellitus with other diabetic kidney complication: Secondary | ICD-10-CM

## 2013-09-26 DIAGNOSIS — M1990 Unspecified osteoarthritis, unspecified site: Secondary | ICD-10-CM

## 2013-09-26 MED ORDER — WARFARIN SODIUM 5 MG PO TABS: 5.0000 mg | ORAL_TABLET | Freq: Two times a day (BID) | ORAL | Status: DC

## 2013-09-26 NOTE — Progress Notes (Signed)
MEDICARE ANNUAL WELLNESS VISIT AND FOLLOW UP Assessment:   1. Acute on chronic diastolic CHF (congestive heart failure), NYHA class 3 Weight stable.   2. Hypertensive cardiomyopathy, without heart failure - CBC with Differential - BASIC METABOLIC PANEL WITH GFR - Hepatic function panel - TSH  3. PAF (paroxysmal atrial fibrillation) - Protime-INR  4. Pleural effusion on left With continuing SOB and some decreased breath sounds left, get CXR and patient requesting pulmonary referral.  He is on amiodarone, will request PFT/pulmonary referrral. - DG Chest 2 View; Future - Ambulatory referral to Pulmonology  5. DIVERTICULOSIS-COLON Increase fiber  6. GERD Controlled diet, occ medication  7. 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 - Hemoglobin A1c - Insulin, fasting - HM DIABETES FOOT EXAM  8. Osteoarthritis, unspecified osteoarthritis type, unspecified site controlled  9. Anxiety Controlled with zoloft  10. Chronic anticoagulation Check INR  11. Depression Remission with zoloft  12. Encounter for long-term (current) use of other medications - Magnesium  13. Hyperlipidemia - Lipid panel  14. Vitamin D Deficiency - Vit D  25 hydroxy (rtn osteoporosis monitoring)  15. 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   16. Need for prophylactic vaccination and inoculation against influenza - Flu vaccine HIGH DOSE PF   Plan:   During the course of the visit the patient was educated and counseled about appropriate screening and preventive services including:    Pneumococcal vaccine   Influenza vaccine  Td vaccine  Screening electrocardiogram  Colorectal cancer  screening  Diabetes screening  Glaucoma screening  Nutrition counseling   Screening recommendations, referrals: Vaccinations: Tdap vaccine needs next visit Influenza vaccine ordered Pneumococcal vaccine needs prevnar next visit Shingles vaccine not indicated Hep B vaccine not indicated  Nutrition assessed and recommended  Colonoscopy due this year Recommended yearly ophthalmology/optometry visit for glaucoma screening and checkup Recommended yearly dental visit for hygiene and checkup Advanced directives - requested  Conditions/risks identified: BMI: Discussed weight loss, diet, and increase physical activity.  Increase physical activity: AHA recommends 150 minutes of physical activity a week.  Medications reviewed Diabetes is not at goal, ACE/ARB therapy: Yes. Urinary Incontinence is not an issue: discussed non pharmacology and pharmacology options.  Fall risk: low- discussed PT, home fall assessment, medications.    Subjective:  [name] is a 73 y.o. male who presents for Medicare Annual Wellness Visit and 3 month follow up for HTN, hyperlipidemia, prediabetes, and vitamin D Def.  Date of last medicare wellness visit was is unknown.  His blood pressure has not been controlled at home running 130's-110's at rehab, today their BP is BP: 140/80 mmHg He has ASHD sp CABG and MAZE in April 2015, he has cardioversion Aug 2015 for Afib and has been in NSR and doing cardiac rehab. He states he can not get his O2 above 93%. He is on Coumadin, he takes 5 mg tablets, 7.5 mg daily. He gets his coumadin checked here, denies any bleeding, bruising.  Lab Results  Component Value Date   INR 1.73* 09/04/2013   INR 3.32* 08/28/2013   INR 1.97* 08/24/2013   He does not workout, but he is doing cardiac rehab. He denies chest pain, dizziness. He has been having mild dyspnea at rehab and has  been unable to get his O2 above 93%, he did have a left pleural effusion and had 1.5 L drained from  this.   He is on cholesterol medication and denies myalgias. His cholesterol is at goal. The cholesterol last visit was:   Lab Results  Component Value Date   CHOL 112 05/19/2013   HDL 31* 05/19/2013   LDLCALC 61 05/19/2013   TRIG 102 05/19/2013   CHOLHDL 3.6 05/19/2013   He has been working on diet and exercise for diabetes, he has CKD due to DM and HTN, he is on MF, amaryl with sugars at home running 110's-120's, he is on Diovan, and denies polydipsia and polyuria. Last A1C in the office was:  Lab Results  Component Value Date   HGBA1C 7.0* 05/19/2013   Patient is on Vitamin D supplement.   Lab Results  Component Value Date   VD25OH 55 05/19/2013     He has macular degeneration and gets injections monthly with Dr. Zigmund Daniel.   Names of Other Physician/Practitioners you currently use: 1. [facility] here for primary care Patient Care Team: [name], MD as PCP - General (Internal Medicine) Inda Castle, MD as Consulting Physician (Gastroenterology) Peter M Martinique, MD as Consulting Physician (Cardiology) Rexene Alberts, MD as Consulting Physician (Cardiothoracic Surgery) Ina Homes, DDS as Referring Physician Hayden Pedro, MD as Consulting Physician (Ophthalmology) Lavonna Monarch, MD as Consulting Physician (Dermatology) Josue Hector, MD as Consulting Physician (Cardiology)  Medication Review: Current Outpatient Prescriptions on File Prior to Visit  Medication Sig Dispense Refill  . acetaminophen (TYLENOL) 500 MG tablet Take 1,000 mg by mouth every 6 (six) hours as needed for mild pain.       [name] ALPRAZolam (XANAX) 0.25 MG tablet Take 0.25 mg by mouth at bedtime as needed for sleep.       [name] amiodarone (PACERONE) 200 MG tablet Take 1 tablet (200 mg total) by mouth 2 (two) times daily.  60 tablet  5  . atorvastatin (LIPITOR) 20 MG tablet Take 20 mg by mouth 3 (three) times a week. Monday Wednesday and Friday      . BESIVANCE  0.6 % SUSP Place 1 drop into the left eye 4 (four) times daily as needed (after injection).       . cloNIDine (CATAPRES) 0.2 MG tablet Take 0.1 mg by mouth 2 (two) times daily.       . diphenhydrAMINE (BENADRYL) 25 MG tablet Take 25 mg by mouth every 6 (six) hours as needed for allergies.       . furosemide (LASIX) 40 MG tablet Take 40 mg by mouth 2 (two) times daily. Takes one tab daily, but sometimes takes extra tab as needed for swelling      . glimepiride (AMARYL) 2 MG tablet Take 2 mg by mouth daily with breakfast.      . HYDROcodone-acetaminophen (VICODIN) 5-500 MG per tablet Take 1 tablet by mouth every 6 (six) hours as needed for pain.      [name] KLOR-CON M20 20 MEQ tablet Take 20 mEq by mouth 3 (three) times daily.       . metFORMIN (GLUCOPHAGE-XR) 500 MG 24 hr tablet Take 1,000 mg by mouth 2 (two) times daily before a meal.       . metoprolol tartrate (LOPRESSOR) 25 MG tablet TAKE 1/2 TABLET BY MOUTH TWICE DAILY  60 tablet  2  . minoxidil (LONITEN) 10 MG tablet Take 10 mg by mouth  daily.      . ranitidine (ZANTAC) 150 MG tablet Take 150 mg by mouth daily as needed for heartburn.       . sertraline (ZOLOFT) 100 MG tablet Take 100 mg by mouth daily.      . valsartan (DIOVAN) 320 MG tablet Take 160 mg by mouth daily.      [name] warfarin (COUMADIN) 5 MG tablet as directed.       No current facility-administered medications on file prior to visit.    Current Problems (verified) Patient Active Problem List   Diagnosis Date Noted  . Chronic anticoagulation 08/22/2013  . Atrial flutter 08/22/2013  . Acute on chronic diastolic CHF (congestive heart failure), NYHA class 3 08/21/2013  . Atrial flutter with rapid ventricular response 08/21/2013  . Vitamin D Deficiency 05/19/2013  . Encounter for long-term (current) use of other medications 05/19/2013  . Pleural effusion on left 05/15/2013  . 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 GERD   . Depression   . Anxiety   . DJD (degenerative joint  disease)   . 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 Chronic diastolic congestive heart failure   . Hypertensive cardiomyopathy- LVH, Nl LVF April 2015   . Hyperlipidemia 09/03/2008  . PAF (paroxysmal atrial fibrillation) 09/03/2008  . DIVERTICULOSIS-COLON 09/03/2008  . Colonic Polyps 09/03/2008    Screening Tests Health Maintenance  Topic Date Due  . Foot Exam  03/31/1950  . Ophthalmology Exam  03[birth year]31/1949  . Zostavax  04/01/1999  . Tetanus/tdap  01/05/2010  . Urine Microalbumin  10/19/2012  . Influenza Vaccine  08/05/2013  . Hemoglobin A1c  11/19/2013  . Colonoscopy  12/10/2016  . Pneumococcal Polysaccharide Vaccine Age 34 And Over  Completed    Immunization History  Administered Date(s) Administered  . Influenza Split 10/25/2012  . Pneumococcal Polysaccharide-23 10/20/2011  . Td 01/06/2000   Preventative care: Last colonoscopy: 2010  Prior vaccinations: TD or Tdap: 2002 Influenza: 2014 DUE Pneumococcal: 2013 Shingles/Zostavax: declines  History reviewed: allergies, current medications, past family history, past medical history, past social history, past surgical history and problem list   Risk Factors: Tobacco History  Substance Use Topics  . Smoking status: Former Smoker -- 4.00 packs/day    Types: Cigarettes    Quit date: 01/05/1981  . Smokeless tobacco: Never Used     Comment: Stopped 1983  . Alcohol Use: Yes     Comment: 1-5 drinks per week   He does not smoke.  Patient is a former smoker. Are there smokers in your home (other than you)?  No  Alcohol Current alcohol use: social drinker  Caffeine Current caffeine use: coffee 3 /day  Exercise Current exercise: doing cardiac rehab  Nutrition/Diet Current diet: in general, a "healthy" diet    Cardiac risk factors: advanced age (older than 41 for men, 1 for women), diabetes mellitus, dyslipidemia, family history of premature cardiovascular disease, hypertension, male gender,  obesity (BMI >= 30 kg/m2) and sedentary lifestyle.  Depression Screen (Note: if answer to either of the following is "Yes", a more complete depression screening is indicated)   Q1: Over the past two weeks, have you felt down, depressed or hopeless? No  Q2: Over the past two weeks, have you felt little interest or pleasure in doing things? No  Have you lost interest or pleasure in daily life? No  Do you often feel hopeless? No  Do you cry easily over simple problems? No  Activities of Daily Living In your present state of health, do you have any difficulty performing the following activities?:  Driving? No Managing money?  No Feeding yourself? No Getting from bed to chair? No Climbing a flight of stairs? No Preparing food and eating?: No Bathing or showering? No Getting dressed: No Getting to the toilet? No Using the toilet:No Moving around from place to place: No In the past year have you fallen or had a near fall?:No   Are you sexually active?  No  Do you have more than one partner?  No  Vision Difficulties: No  Hearing Difficulties: No Do you often ask people to speak up or repeat themselves? No Do you experience ringing or noises in your ears? No Do you have difficulty understanding soft or whispered voices? No  Cognition  Do you feel that you have a problem with memory?No  Do you often misplace items? No  Do you feel safe at home?  Yes  Advanced directives Does patient have a [place]? No Does patient have a Living Will? No   Objective:   Blood pressure 140/80, pulse 72, temperature 97.9 F (36.6 C), resp. rate 16, height 5\' 11"  (1.803 m), weight 198 lb (89.812 kg). Body mass index is 27.63 kg/(m^2).  General appearance: alert, no distress, WD/WN, male Cognitive Testing  Alert? Yes  Normal Appearance?Yes  Oriented to person? Yes  Place? Yes   Time? Yes  Recall of three objects?  Yes  Can perform simple calculations? Yes  Displays  appropriate judgment?Yes  Can read the correct time from a watch face?Yes  HEENT: normocephalic, sclerae anicteric, TMs pearly, nares patent, no discharge or erythema, pharynx normal Oral cavity: MMM, no lesions Neck: supple, no lymphadenopathy, no thyromegaly, no masses Heart: RRR, normal S1, S2, systolic murmur Lungs: CTA bilaterally, with decreased breath sounds LLL.  Abdomen: +bs, soft, non tender, non distended, no masses, no hepatomegaly, no splenomegaly Musculoskeletal: nontender, no swelling, no obvious deformity Extremities: mild bilateral edema with stasis dermatitis, no cyanosis, no clubbing Pulses: 2+ symmetric, upper and lower extremities, normal cap refill Neurological: alert, oriented x 3, CN2-12 intact, strength normal upper extremities and lower extremities, sensation normal throughout, DTRs 2+ throughout, no cerebellar signs, gait normal Psychiatric: normal affect, behavior normal, pleasant   Medicare Attestation I have personally reviewed: The patient's medical and social history Their use of alcohol, tobacco or illicit drugs Their current medications and supplements The patient's functional ability including ADLs,fall risks, home safety risks, cognitive, and hearing and visual impairment Diet and physical activities Evidence for depression or mood disorders  The patient's weight, height, BMI, and visual acuity have been recorded in the chart.  I have made referrals, counseling, and provided education to the patient based on review of the above and I have provided the patient with a written personalized care plan for preventive services.     Vicie Mutters, PA-C   09/26/2013

## 2013-09-27 ENCOUNTER — Encounter (HOSPITAL_COMMUNITY)
Admission: RE | Admit: 2013-09-27 | Discharge: 2013-09-27 | Disposition: A | Discharge status: Home / Self Care | Payer: Medicare Other | Source: Ambulatory Visit | Attending: Cardiovascular Disease | Admitting: Cardiovascular Disease

## 2013-09-27 DIAGNOSIS — Z5189 Encounter for other specified aftercare: Secondary | ICD-10-CM | POA: Diagnosis not present

## 2013-09-27 LAB — HEPATIC FUNCTION PANEL
ALT: 16 U/L (ref 0–53)
AST: 16 U/L (ref 0–37)
Albumin: 4.1 g/dL (ref 3.5–5.2)
Alkaline Phosphatase: 74 U/L (ref 39–117)
BILIRUBIN TOTAL: 0.7 mg/dL (ref 0.2–1.2)
Bilirubin, Direct: 0.2 mg/dL (ref 0.0–0.3)
Indirect Bilirubin: 0.5 mg/dL (ref 0.2–1.2)
Total Protein: 6.9 g/dL (ref 6.0–8.3)

## 2013-09-27 LAB — TSH: TSH: 1.393 u[IU]/mL (ref 0.350–4.500)

## 2013-09-27 LAB — BASIC METABOLIC PANEL WITH GFR
BUN: 30 mg/dL — ABNORMAL HIGH (ref 6–23)
CALCIUM: 9.3 mg/dL (ref 8.4–10.5)
CO2: 29 meq/L (ref 19–32)
Chloride: 105 mEq/L (ref 96–112)
Creat: 1.21 mg/dL (ref 0.50–1.35)
GFR, EST AFRICAN AMERICAN: 68 mL/min
GFR, Est Non African American: 59 mL/min — ABNORMAL LOW
GLUCOSE: 178 mg/dL — AB (ref 70–99)
Potassium: 4.4 mEq/L (ref 3.5–5.3)
Sodium: 143 mEq/L (ref 135–145)

## 2013-09-27 LAB — HEMOGLOBIN A1C
HEMOGLOBIN A1C: 6.1 % — AB (ref <5.7)
MEAN PLASMA GLUCOSE: 128 mg/dL — AB (ref <117)

## 2013-09-27 LAB — CBC WITH DIFFERENTIAL/PLATELET
Basophils Absolute: 0 K/uL (ref 0.0–0.1)
Basophils Relative: 0 % (ref 0–1)
Eosinophils Absolute: 0.2 K/uL (ref 0.0–0.7)
Eosinophils Relative: 2 % (ref 0–5)
HCT: 39.5 % (ref 39.0–52.0)
Hemoglobin: 12.8 g/dL — ABNORMAL LOW (ref 13.0–17.0)
Lymphocytes Relative: 13 % (ref 12–46)
Lymphs Abs: 1 K/uL (ref 0.7–4.0)
MCH: 30.4 pg (ref 26.0–34.0)
MCHC: 32.4 g/dL (ref 30.0–36.0)
MCV: 93.8 fL (ref 78.0–100.0)
Monocytes Absolute: 0.5 K/uL (ref 0.1–1.0)
Monocytes Relative: 6 % (ref 3–12)
Neutro Abs: 6 K/uL (ref 1.7–7.7)
Neutrophils Relative %: 79 % — ABNORMAL HIGH (ref 43–77)
Platelets: 212 K/uL (ref 150–400)
RBC: 4.21 MIL/uL — ABNORMAL LOW (ref 4.22–5.81)
RDW: 16 % — ABNORMAL HIGH (ref 11.5–15.5)
WBC: 7.6 K/uL (ref 4.0–10.5)

## 2013-09-27 LAB — GLUCOSE, CAPILLARY: Glucose-Capillary: 263 mg/dL — ABNORMAL HIGH (ref 70–99)

## 2013-09-27 LAB — LIPID PANEL
Cholesterol: 100 mg/dL (ref 0–200)
HDL: 31 mg/dL — ABNORMAL LOW
LDL Cholesterol: 51 mg/dL (ref 0–99)
Total CHOL/HDL Ratio: 3.2 ratio
Triglycerides: 90 mg/dL
VLDL: 18 mg/dL (ref 0–40)

## 2013-09-27 LAB — MAGNESIUM: MAGNESIUM: 2 mg/dL (ref 1.5–2.5)

## 2013-09-27 LAB — VITAMIN D 25 HYDROXY (VIT D DEFICIENCY, FRACTURES): Vit D, 25-Hydroxy: 71 ng/mL (ref 30–89)

## 2013-09-27 LAB — PROTIME-INR
INR: 2.31 — ABNORMAL HIGH
Prothrombin Time: 25.4 s — ABNORMAL HIGH (ref 11.6–15.2)

## 2013-09-27 LAB — INSULIN, FASTING: Insulin fasting, serum: 11.2 u[IU]/mL (ref 2.0–19.6)

## 2013-09-27 NOTE — Progress Notes (Signed)
Pt will graduate on Friday 9/25 with the completion of 29 exercise sessions during 18 week period of participation in cardiac rehab.  Pt made some progress however this was slow due to attending 2 x week and hospitalization. Overall he feels good about his progress with his heart but is eager to determine the cause of his shortness of breath on exertion.  Pt plans to pursue pulmonary work up in the near future.  Pt  Plans to focus attention toward his right shoulder with physical therapy.  Pt will continue home exercise with walking and possible silver sneakers at the local [employer].  Encouraged pt to consider cardiac rehab maintenance if he feels he is having difficulty with attendance consistency.  Pt would benefit from a group setting for exercise due to the social component.  Repeat PHQ2 score 0.  Pt has met his goals of increased endurance and feels his is making progress toward being at 100%.  Pt is a delightful pt and was a joy to work with.  Cherre Huger, BSN

## 2013-09-29 ENCOUNTER — Encounter (HOSPITAL_COMMUNITY)
Admission: RE | Admit: 2013-09-29 | Discharge: 2013-09-29 | Disposition: A | Discharge status: Home / Self Care | Payer: Medicare Other | Source: Ambulatory Visit | Attending: Cardiovascular Disease | Admitting: Cardiovascular Disease

## 2013-09-29 ENCOUNTER — Ambulatory Visit: Payer: Self-pay | Admitting: Internal Medicine

## 2013-09-29 DIAGNOSIS — Z5189 Encounter for other specified aftercare: Secondary | ICD-10-CM | POA: Diagnosis not present

## 2013-09-29 NOTE — Progress Notes (Signed)
[name]'s entry blood pressure was 152/72 today. Patient reports taking all of his medications this morning.  [name] denied any complaints during exercise today. [name] reported that he took his medications today. Maximum blood pressure noted at 162/80 on the track. Patient instructed to exercise at a lower work load.Subsequent blood pressures improved.  Exit blood pressure 132/78.  [facility] graduates today and will continue to monitor his blood pressures at home.

## 2013-10-02 ENCOUNTER — Other Ambulatory Visit: Payer: Self-pay | Admitting: Internal Medicine

## 2013-10-05 ENCOUNTER — Ambulatory Visit (INDEPENDENT_AMBULATORY_CARE_PROVIDER_SITE_OTHER)
Admission: RE | Admit: 2013-10-05 | Discharge: 2013-10-05 | Disposition: A | Discharge status: Home / Self Care | Payer: Medicare Other | Source: Ambulatory Visit | Attending: Internal Medicine | Admitting: Internal Medicine

## 2013-10-05 ENCOUNTER — Encounter: Payer: Self-pay | Admitting: Internal Medicine

## 2013-10-05 ENCOUNTER — Ambulatory Visit (INDEPENDENT_AMBULATORY_CARE_PROVIDER_SITE_OTHER): Payer: Medicare Other | Admitting: Internal Medicine

## 2013-10-05 ENCOUNTER — Other Ambulatory Visit (INDEPENDENT_AMBULATORY_CARE_PROVIDER_SITE_OTHER): Payer: Medicare Other

## 2013-10-05 VITALS — BP 150/82 | HR 68 | Temp 99.0°F | Ht 71.0 in | Wt 198.0 lb

## 2013-10-05 DIAGNOSIS — J90 Pleural effusion, not elsewhere classified: Secondary | ICD-10-CM

## 2013-10-05 DIAGNOSIS — R0600 Dyspnea, unspecified: Secondary | ICD-10-CM

## 2013-10-05 DIAGNOSIS — J948 Other specified pleural conditions: Secondary | ICD-10-CM

## 2013-10-05 LAB — BRAIN NATRIURETIC PEPTIDE: Pro B Natriuretic peptide (BNP): 309 pg/mL — ABNORMAL HIGH (ref 0.0–100.0)

## 2013-10-05 LAB — SEDIMENTATION RATE: Sed Rate: 22 mm/hr (ref 0–22)

## 2013-10-05 NOTE — Progress Notes (Signed)
Subjective:    Patient ID: [name], male    DOB: 1940-05-06,   MRN: [id number]  HPI  84 yowm quit smoking 1983 no resp problems then s/p MAZE 99991111 complicated by bloody L effusion s/p tap 1.1 liters 05/10/13 and did well with rehab until mid August 2015 and then  gradually worse doe since  so referred to pulmonary clinic 10/05/2013   by Dr Nahser/ [place]   10/05/2013 1st [facility] office visit/ [facility]   Chief Complaint  Patient presents with  . Pulmonary Consult    Referred by Dr. [name] for eval of pleural effusion.  Pt c/o SOB since June 2015- worse for the past 2 wks. He states that he is mainly only SOB with exertion- out of breath walking approx 150 ft.   occ pnd despite cpap better if gets off back and rotates to L side down/ leg swelling post op better with lasix Also now on amiodarone  Doe is variable - sometimes gets up from street to house much better than others but overall activity tol /abilitiy to do rehab steadily worse. Onset was gradual.  No obvious   patterns in day to day or daytime variabilty or assoc chronic cough or cp or chest tightness, subjective wheeze overt sinus or hb symptoms. No unusual exp hx or h/o childhood pna/ asthma or knowledge of premature birth.  Sleeping ok without nocturnal  or early am exacerbation  of respiratory  c/o's or need for noct saba. Also denies any obvious fluctuation of symptoms with weather or environmental changes or other aggravating or alleviating factors except as outlined above   Current Medications, Allergies, Complete Past Medical History, Past Surgical History, Family History, and Social History were reviewed in [employer] record.             Review of Systems  Constitutional: Negative for fever, chills, activity change, appetite change and unexpected weight change.  HENT: Positive for congestion. Negative for dental problem, postnasal drip, rhinorrhea, sneezing, sore throat,  trouble swallowing and voice change.   Eyes: Negative for visual disturbance.  Respiratory: Positive for shortness of breath. Negative for cough and choking.   Cardiovascular: Negative for chest pain and leg swelling.  Gastrointestinal: Negative for nausea, vomiting and abdominal pain.  Genitourinary: Negative for difficulty urinating.  Musculoskeletal: Positive for arthralgias.  Skin: Negative for rash.  Psychiatric/Behavioral: Negative for behavioral problems and confusion.       Objective:   Physical Exam  slt hoarse wm nad  Wt Readings from Last 3 Encounters:  10/06/13 198 lb (89.812 kg)  10/05/13 198 lb (89.812 kg)  09/26/13 198 lb (89.812 kg)     HEENT: nl dentition, turbinates, and orophanx. Nl external ear canals without cough reflex   NECK :  without JVD/Nodes/TM/ nl carotid upstrokes bilaterally   LUNGS: no acc muscle use, decreased bs with dulless L Base    CV:  RRR  no s3 or murmur or increase in P2, no edema   ABD:  soft and nontender with nl excursion in the supine position. No bruits or organomegaly, bowel sounds nl  MS:  warm without deformities, calf tenderness, cyanosis or clubbing  SKIN: warm and dry without lesions    NEURO:  alert, approp, no deficits     CXR  10/05/2013 : Cardiomegaly and pulmonary edema. Some increase in a small left effusion and basilar airspace disease, likely atelectasis.  No results found for this basename: NA, K, CL, CO2,  BUN, CREATININE, GLUCOSE,  in the last 168 hours No results found for this basename: HGB, HCT, WBC, PLT,  in the last 168 hours   Lab Results  Component Value Date   TSH 1.393 09/26/2013     Lab Results  Component Value Date   PROBNP 309.0* 10/05/2013     Lab Results  Component Value Date   ESRSEDRATE 22 10/05/2013             Assessment & Plan:

## 2013-10-05 NOTE — Patient Instructions (Addendum)
Please remember to go to the lab and x-ray department downstairs for your tests - we will call you with the results when they are available.    Pace yourself so you don't push your 02 sats much below 90%   Options for managing the fluid include removing it again vs placing a pleurex catheter in to drain it all permanently - since you have to be off coumadin first for each procedure I would recommend you discuss this with Dr Roxy Manns so you only stop the coumadin once.   I will write Dr Roxy Manns with my recs once I review your studies

## 2013-10-05 NOTE — Progress Notes (Signed)
Quick Note:  Patient notified of lab results. Verbalized understanding. ______

## 2013-10-06 ENCOUNTER — Ambulatory Visit (INDEPENDENT_AMBULATORY_CARE_PROVIDER_SITE_OTHER): Payer: Medicare Other | Admitting: Cardiovascular Disease

## 2013-10-06 ENCOUNTER — Encounter: Payer: Self-pay | Admitting: Cardiovascular Disease

## 2013-10-06 VITALS — BP 144/90 | HR 65 | Ht 71.0 in | Wt 198.0 lb

## 2013-10-06 DIAGNOSIS — I483 Typical atrial flutter: Secondary | ICD-10-CM

## 2013-10-06 DIAGNOSIS — I480 Paroxysmal atrial fibrillation: Secondary | ICD-10-CM

## 2013-10-06 NOTE — Assessment & Plan Note (Signed)
He is s/p MAZE procedure. Is back in A-fib. He did not want to have a cardioversion but will call us back if he decides to.

## 2013-10-06 NOTE — Assessment & Plan Note (Signed)
[name]  is back in atrial fibrillation. His rate is 60. He continues on amiodarone 200 mg a day. He's had more problems with shortness of breath recently. He has been to see Dr. Melvyn Novas .  I offered to do a cardioversion but he wanted to wait and see what Dr. Melvyn Novas has to say about his lungs .  His pro-BNP is 309 - only minimally elevated.  Pro-BNP was 902 in August which suggests that this decompensation is not due to worsening CHF.    He had a CXR yesterday and has cardiomegaly - no significant left pleural effusion.  Mild  pulmonary edema.   I will see him again in 3 months.  He will see what Dr. Melvyn Novas says and he will call us back if he wants to have a cardioversion.   His rate is well controlled but he the a-fib may be the etiology of his recent decompensation.

## 2013-10-06 NOTE — Progress Notes (Addendum)
[name] Date of Birth  Feb 13, 1940 [facility] [address]    [place], [place]  [address] [phone]  Fax  [phone]  Problem list: 1. Hypertension 2. HOCM 3. Aortic insuffuciency 4. Atrial fibrillation-failed Tikosyn,   Has seen [facility] in [place], [place] .   5. Diabetes mellitus 6. 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 He has a hyperdynamic LV with systolic anterior motion of the mitral valve leaflet. 8. CABG and MAZE 04-05-13  History of Present Illness:  [name] is a 73 year old gentleman with a history of hypertension, atrial fibrillation and a myocardial bridge.  He is a previous patient of Dr. Susa Simmonds. He also has a history of diabetes. He has felt fairly well. He has not had any episodes of chest pain or shortness of breath.    Nov. 25, 2013: He is very symptomatic with his Atrial fib.  He gets very winded with walking.  He has been on Eliquis and tikosyn 500 BID.  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  He was diagnosed with panic attack.  Enzymes were normal.  He is ready for his cardioversion.  Nov. 27, 2013: Mr. [name] was seen 2 days ago and was found to have atrial fibrillation. He was scheduled for cardioversion. When he showed up for cardioversion yesterday, he was in normal sinus rhythm.  He related to me that he had some medication changes over the past several weeks. He was previously on Tikosyn and citalopram and his atrial fibrillation seemed to be well-controlled on that.  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  It was at that time that he started having increased palpitations, anxiety, and we noted him to be in atrial fibrillation. He then discontinued the Wellbutrin and was started on Zoloft.  He seems to be tolerating this combination. It was at that time that he showed up in the short  stay Center and was found to have already converted to sinus rhythm.  May 31, 2012:  [name] is doing well.  Still in NSR.  No leg edema.  He sticks to a low sodium diet.   He has occasional CP - typically associated with arm / chest movement.   He wants to stop coumadin - interferes with his arthritis medications.  He has maintained NSR since his last visit.    Nov. 24, 2014:  [name] is doing OK.  He has some brief palps - he thinks they are PVCs.  No sustained episodes of Afib.   He has been getting Avastin injections into his eyes.    Jan. 12, 2015:  [name] is seen today as a work in visit for worsening ankle edema.   He has not been able to associate his symptoms with change of diet.  .  he has also been noticing some increasing shortness of breath.  He notes short or shortness of breath with any activity-example take the garbage cans out.   He denies any PND. He uses a CPAP mask.    Jan. 26, 2015:  [name] is seen back today . He had CABG and MAZE on April 05, 2013 - slow recovery following surgery Had a left pleural tap ~ 4 weeks after surgery - 1.2 liters taken off - did much better after that  Had elective cardioversioin in early August, 2015.   [name] was seen 2  weeks ago with symptoms consistent with congestive heart failure. He had been in persistent atrial fibrillation and so his tikosyn  was stopped.  He is still in  atrial fibrillation. His rate is well controlled.     He had an echocardiogram: Left ventricle: Wall thickness was increased in a pattern of moderate LVH. Systolic function was vigorous. The estimated ejection fraction was in the range of 75% to 80%. Wall motion was normal; there were no regional wall motion abnormalities. - Aortic valve: Trivial regurgitation. - Left atrium: The atrium was mildly to moderately dilated. - Right ventricle: The cavity size was mildly dilated. Systolic function was mildly reduced. - Right atrium: The atrium was moderately dilated. -  Pulmonary arteries: Systolic pressure was mildly increased. PA peak pressure: 51mm Hg  He was found to have mild pulmonary hypertension.   He continues to have some dyspnea.  He has ankle edema.  His BP log shows mildly elevated BP.  May 26, 2013: [name] has had 2 V CABG and MAZE  (April 05, 2013)  since I last saw him.   . He has done well He is having some tenderness along the right thigh SVG harvest site.  Also c/o left 4th and 5th finger tingling / numbness.    He has otherwise done well.  No cardiac complaints.    August 30, 2013:  He was cardioverted several weeks ago.  After the cardioversion, his metoprolol was doubled to 25 BID, and diltizem was added   Oct. 2, 2015:  He has gone back into atrial fib.  He has known left pleural effusion. He has had more dyspnea.  Saw Dr. Melvyn Novas yesterday.  He is more lethargic.  Has hypoxemia ( O2 sat of 88% with walking 50 feet)     Current Outpatient Prescriptions on File Prior to Visit  Medication Sig Dispense Refill  . acetaminophen (TYLENOL) 500 MG tablet Take 1,000 mg by mouth every 6 (six) hours as needed for mild pain.       [name] ALPRAZolam (XANAX) 1 MG tablet TAKE ONE-HALF TO ONE TABLET BY MOUTH THREE TIMES DAILY  90 tablet  0  . amiodarone (PACERONE) 200 MG tablet Take 200 mg by mouth daily.      [name] atorvastatin (LIPITOR) 20 MG tablet Take 20 mg by mouth 3 (three) times a week. Monday Wednesday and Friday      . BESIVANCE 0.6 % SUSP Place 1 drop into the left eye 4 (four) times daily as needed (after injection).       . cloNIDine (CATAPRES) 0.2 MG tablet Take 0.1 mg by mouth 2 (two) times daily.       . diphenhydrAMINE (BENADRYL) 25 MG tablet Take 25 mg by mouth every 6 (six) hours as needed for allergies.       . furosemide (LASIX) 40 MG tablet Take 40 mg by mouth 2 (two) times daily. Takes one tab daily, but sometimes takes extra tab as needed for swelling      . glimepiride (AMARYL) 2 MG tablet Take 2 mg by mouth daily with breakfast.  If am fasting blood glucose is less than 120 hold amaryl dose.  For fasting blood glucose over 120 take 1/2 amaryl (1mg ).      . HYDROcodone-acetaminophen (VICODIN) 5-500 MG per tablet Take 1 tablet by mouth every 6 (six) hours as needed for pain.      [name] KLOR-CON M20 20 MEQ tablet Take 20 mEq by mouth 3 (three) times daily.       [name]  metFORMIN (GLUCOPHAGE-XR) 500 MG 24 hr tablet Take 1,000 mg by mouth 2 (two) times daily before a meal.       . metoprolol tartrate (LOPRESSOR) 25 MG tablet TAKE 1/2 TABLET BY MOUTH TWICE DAILY  60 tablet  2  . minoxidil (LONITEN) 10 MG tablet Take 10 mg by mouth daily.      . ranitidine (ZANTAC) 150 MG tablet Take 150 mg by mouth daily as needed for heartburn.       . sertraline (ZOLOFT) 100 MG tablet Take 100 mg by mouth daily.      . valsartan (DIOVAN) 320 MG tablet Take 160 mg by mouth daily.      [name] warfarin (COUMADIN) 5 MG tablet Take 5 mg by mouth 2 (two) times daily. 7.5 mg daily according to inr results       No current facility-administered medications on file prior to visit.    Allergies  Allergen Reactions  . Horse-Derived Products Other (See Comments)    Based on skin test reaction  . Other Other (See Comments)    Tetanus Shot -- skin test reaction  . Sunflower Seed [Sunflower Oil] Swelling    Tongue and lip swelling    Past Medical History  Diagnosis Date  . Hx of adenomatous colonic polyps   . Diabetes mellitus type II   . Hypertension   . Diverticulosis 2001  . Hyperlipidemia   . Persistent atrial fibrillation     a. on amio and coumadin;  b. s/p MAZE 04/2013 in setting of CABG.  . Obstructive sleep apnea     compliant with CPAP  . Chronic diastolic congestive heart failure   . Hypertensive cardiomyopathy   . History of cardioversion     x3 (years uncertain)  . H/O hiatal hernia   . Cancer     Basal cell carcinoma  . Adrenal adenoma   . CAD (coronary artery disease)     a. 04/2013 CABG x 2: LIMA to LAD, SVG to RI, EVH via R thigh.   . S/P Maze operation for atrial fibrillation     a. 04/2013: Complete bilateral atrial lesion set using cryothermy and bipolar radiofrequency ablation with clipping of LA appendage (@ time of CABG)  . Type II or unspecified type diabetes mellitus without mention of complication, not stated as uncontrolled   . GERD (gastroesophageal reflux disease)   . Depression   . Anxiety   . DJD (degenerative joint disease)   . Atrial flutter 2015    Past Surgical History  Procedure Laterality Date  . Polinydal cyst      Removed  . Great toe arthrodesis, interphalangeal joint      Right foot  . Retina repair-right    . Cataract extraction      bilateral  . Basal cell carcinoma excision      x3 on face  . Polypectomy    . Cardiac catheterization      myocardial bridge but no cad  . Coronary artery bypass graft N/A 04/05/2013    Procedure: CORONARY ARTERY BYPASS GRAFTING (CABG) TIMES TWO USING LEFT INTERNAL MAMMARY ARTERY AND RIGHT SAPHENOUS LEG VEIN HARVESTED ENDOSCOPICALLY;  Surgeon: Rexene Alberts, MD;  Location: [facility];  Service: Open Heart Surgery;  Laterality: N/A;  . Maze N/A 04/05/2013    Procedure: MAZE;  Surgeon: Rexene Alberts, MD;  Location: [facility];  Service: Open Heart Surgery;  Laterality: N/A;  . Intraoperative transesophageal echocardiogram N/A 04/05/2013    Procedure: INTRAOPERATIVE  TRANSESOPHAGEAL ECHOCARDIOGRAM;  Surgeon: Rexene Alberts, MD;  Location: [facility];  Service: Open Heart Surgery;  Laterality: N/A;  . Tee without cardioversion N/A 08/23/2013    Procedure: TRANSESOPHAGEAL ECHOCARDIOGRAM (TEE);  Surgeon: Sanda Klein, MD;  Location: [facility];  Service: Cardiovascular;  Laterality: N/A;  . Cardioversion N/A 08/23/2013    Procedure: CARDIOVERSION;  Surgeon: Sanda Klein, MD;  Location: [facility];  Service: Cardiovascular;  Laterality: N/A;    History  Smoking status  . Former Smoker -- 4.00 packs/day for 25 years  . Types: Cigarettes  . Quit date: 01/05/1981   Smokeless tobacco  . Never Used    History  Alcohol Use  . Yes    Comment: 1-5 drinks per week    Family History  Problem Relation Age of Onset  . Colon cancer Mother     Family History/Uncle   . Colon polyps Mother     Family History  . Atrial fibrillation Mother   . Hypertension Mother   . Colon polyps Sister     Family history  . Diabetes Maternal Uncle   . Stroke Paternal Uncle   . Dementia Father     Reviw of Systems:  Reviewed in the HPI.  All other systems are negative.  Physical Exam: BP 144/90  Pulse 65  Ht 5\' 11"  (1.803 m)  Wt 198 lb (89.812 kg)  BMI 27.63 kg/m2 The patient is alert and oriented x 3.  The mood and affect are normal.   Skin: warm and dry.  Color is normal.    HEENT:   the sclera are nonicteric.  The mucous membranes are moist.  The carotids are 2+ without bruits.  There is no thyromegaly.  There is no JVD.   Lungs: clear.  The chest wall is non tender.   Heart: RR.   with a normal S1 and S2.  He has a hyperdynamic PMI. He has a very soft but brief 2/6 systolic murmur at the left cardiac  border. 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 Abdomen: good bowel sounds.  There is no guarding or rebound.  There is no hepatosplenomegaly or tenderness.  There are no masses.  Extremities:  no clubbing, cyanosis, or edema.  The legs are without rashes.  The distal pulses are intact.  Neuro:  Cranial nerves II - XII are intact.  Motor and sensory functions are intact.   The gait is normal.  ECG: 10/06/2013: Atrial Fibrillation with a ventricular rate of 61.  Assessment / Plan:

## 2013-10-06 NOTE — Patient Instructions (Signed)
Your physician recommends that you schedule a follow-up appointment in: 3 months. Your physician recommends that you continue on your current medications as directed. Please refer to the Current Medication list given to you today. 

## 2013-10-07 ENCOUNTER — Encounter: Payer: Self-pay | Admitting: Internal Medicine

## 2013-10-07 NOTE — Assessment & Plan Note (Addendum)
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  Would send fluid either waty for cell count and diff, glucose, protein, LDH, cytology to be complete.

## 2013-10-07 NOTE — Assessment & Plan Note (Signed)
Always concerned about amio but believe most of his problem is related to effusion/ atx on L

## 2013-10-08 ENCOUNTER — Other Ambulatory Visit: Payer: Self-pay | Admitting: Cardiovascular Disease

## 2013-10-16 ENCOUNTER — Encounter: Payer: Self-pay | Admitting: Thoracic Surgery (Cardiothoracic Vascular Surgery)

## 2013-10-16 ENCOUNTER — Ambulatory Visit (INDEPENDENT_AMBULATORY_CARE_PROVIDER_SITE_OTHER): Payer: Medicare Other | Admitting: Thoracic Surgery (Cardiothoracic Vascular Surgery)

## 2013-10-16 VITALS — BP 140/71 | HR 60 | Resp 16 | Ht 71.0 in | Wt 198.0 lb

## 2013-10-16 DIAGNOSIS — Z9889 Other specified postprocedural states: Secondary | ICD-10-CM

## 2013-10-16 DIAGNOSIS — I484 Atypical atrial flutter: Secondary | ICD-10-CM

## 2013-10-16 DIAGNOSIS — I481 Persistent atrial fibrillation: Secondary | ICD-10-CM

## 2013-10-16 DIAGNOSIS — Z951 Presence of aortocoronary bypass graft: Secondary | ICD-10-CM

## 2013-10-16 DIAGNOSIS — I43 Cardiomyopathy in diseases classified elsewhere: Secondary | ICD-10-CM

## 2013-10-16 DIAGNOSIS — Z8679 Personal history of other diseases of the circulatory system: Secondary | ICD-10-CM

## 2013-10-16 DIAGNOSIS — I429 Cardiomyopathy, unspecified: Secondary | ICD-10-CM

## 2013-10-16 DIAGNOSIS — I110 Hypertensive heart disease with heart failure: Secondary | ICD-10-CM

## 2013-10-16 DIAGNOSIS — I2510 Atherosclerotic heart disease of native coronary artery without angina pectoris: Secondary | ICD-10-CM

## 2013-10-16 DIAGNOSIS — I4819 Other persistent atrial fibrillation: Secondary | ICD-10-CM

## 2013-10-16 DIAGNOSIS — I5032 Chronic diastolic (congestive) heart failure: Secondary | ICD-10-CM

## 2013-10-16 NOTE — Progress Notes (Signed)
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 His early postoperative course in the hospital was uncomplicated although he did go back into rate-controlled atrial fibrillation or atrial flutter. He later developed shortness of breath, orthopnea, and the left pleural effusion associated with acute exacerbation of chronic diastolic congestive heart failure. He underwent left thoracentesis for his pleural effusion in early May yielding 1.2 L of fluid and he spontaneously converted to sinus rhythm prior to his appointment with Dr. Acie Fredrickson on 05/26/2013.  Over the next 3 months the patient did quite well. He participated actively in the cardiac rehabilitation program and made good progress.  He was last seen here in our office on 07/10/2013 at which time he was maintaining sinus rhythm and doing quite well.  However, he subsequently developed atrial flutter which was associated with acute exacerbation of chronic diastolic congestive heart failure. He was hospitalized briefly in August, during which time he underwent DC cardioversion.  He initially felt better again after cardioversion, but within a few weeks he began to experience worsening symptoms of exertional shortness of breath. He was referred to Dr. Melvyn Novas from the Pulmonary Medicine team because of his pleural effusion who felt that a small pleural effusion was might most likely related to his previous surgery and recommended followup with [place]. The patient has also been seen in followup recently by Dr. Acie Fredrickson who felt that he had gone back into atrial fibrillation and discussed possible repeat cardioversion.   The patient returns to our  office for further followup today.  He describes persistent exertional shortness of breath. He denies resting shortness of breath, PND, or orthopnea. He has not had any chest pain or chest tightness either with activity or at rest. He has not had any dizzy spells, nor syncope. He has not been checking his weight on a regular basis, but he states that his weight had gone up a few pounds and last week he increased his dose of Lasix accordingly.  He denies any fevers chills or productive cough.   Current Outpatient Prescriptions  Medication Sig Dispense Refill  . acetaminophen (TYLENOL) 500 MG tablet Take 1,000 mg by mouth every 6 (six) hours as needed for mild pain.       [name] ALPRAZolam (XANAX) 1 MG tablet TAKE ONE-HALF TO ONE TABLET BY MOUTH THREE TIMES DAILY  90 tablet  0  . amiodarone (PACERONE) 200 MG tablet Take 200 mg by mouth daily.      [name] atorvastatin (LIPITOR) 20 MG tablet Take 20 mg by mouth 3 (three) times a week. Monday Wednesday and Friday      . BESIVANCE 0.6 % SUSP Place 1 drop into the left eye 4 (four) times daily as needed (after injection).       . cloNIDine (CATAPRES) 0.2 MG tablet Take 0.2 mg by mouth 2 (two) times daily.       . diphenhydrAMINE (BENADRYL) 25 MG tablet Take 25 mg by mouth every 6 (six) hours as needed for allergies.       . furosemide (LASIX) 40 MG tablet Take 40 mg by mouth 2 (  two) times daily. Takes one tab daily, but sometimes takes extra tab as needed for swelling      . glimepiride (AMARYL) 2 MG tablet Take 2 mg by mouth daily with breakfast. If am fasting blood glucose is less than 120 hold amaryl dose.  For fasting blood glucose over 120 take 1/2 amaryl (1mg ).      . HYDROcodone-acetaminophen (VICODIN) 5-500 MG per tablet Take 1 tablet by mouth every 6 (six) hours as needed for pain.      [name] KLOR-CON M20 20 MEQ tablet Take 20 mEq by mouth 3 (three) times daily.       . metFORMIN (GLUCOPHAGE-XR) 500 MG 24 hr tablet Take 1,000 mg by mouth 2 (two) times daily  before a meal.       . metoprolol tartrate (LOPRESSOR) 25 MG tablet TAKE 1/2 TABLET BY MOUTH TWICE DAILY  60 tablet  2  . minoxidil (LONITEN) 10 MG tablet Take 20 mg by mouth.       . ranitidine (ZANTAC) 150 MG tablet Take 150 mg by mouth daily as needed for heartburn.       . sertraline (ZOLOFT) 100 MG tablet Take 100 mg by mouth daily.      . valsartan (DIOVAN) 320 MG tablet Take 160 mg by mouth daily.      [name] warfarin (COUMADIN) 5 MG tablet Take 5 mg by mouth 2 (two) times daily. 7.5 mg daily according to inr results..*OR AS DIRECTED       No current facility-administered medications for this visit.      Physical Exam:   BP 140/71  Pulse 60  Resp 16  Ht 5\' 11"  (1.803 m)  Wt 198 lb (89.812 kg)  BMI 27.63 kg/m2  SpO2 94%  General:  Well-appearing  Chest:   Few bibasilar inspiratory crackles, slightly diminished breath sounds left lung base  CV:   Irregular rate and rhythm  Incisions:  Completely healed, sternum is stable  Abdomen:  Soft and nontender  Extremities:  Warm and well-perfused, no lower extremity edema  Diagnostic Tests:  Transesophageal Echocardiography  Patient: [name], [name] MR #: [id number] Study Date: 08/23/2013 Gender: M Age: 73 Height: 180.3 cm Weight: BSA: Pt. Status: Room: 3E13C  Bernette Redbird K ADMITTING Darlina Guys, MD ATTENDING Darlina Guys, MD PERFORMING Sanda Klein, MD SONOGRAPHER Donata Clay  cc:  ------------------------------------------------------------------- LV EF: 65% - 70%  ------------------------------------------------------------------- Indications: Atrial flutter 427.32.  ------------------------------------------------------------------- History: PMH: Atrial fibrillation. Congestive heart failure. Risk factors: Hypertension. Dyslipidemia.  ------------------------------------------------------------------- Study Conclusions  - Left ventricle: The cavity size was normal.  Wall thickness was increased in a pattern of moderate LVH. Systolic function was normal. The estimated ejection fraction was in the range of 65% to 70%. There was no dynamic obstruction. - Aortic valve: No evidence of vegetation. There was trivial regurgitation. - Mitral valve: There was mild systolic anterior motion of the anterior leaflet. No evidence of vegetation. - Left atrium: The atrium was mildly to moderately dilated. No evidence of thrombus in the atrial cavity or appendage. No spontaneous echo contrast was observed. - Right atrium: No evidence of thrombus in the atrial cavity or appendage. No evidence of thrombus in the atrial cavity or appendage. - Atrial septum: There was a very small patent foramen ovale. 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 - Tricuspid valve: No evidence of vegetation. No evidence of vegetation. - Pulmonic valve: No evidence of vegetation. - Pulmonary arteries:  PA peak pressure: 35 mm Hg (S). - Pericardium, extracardiac: There was a left pleural effusion.  ------------------------------------------------------------------- Labs, prior tests, procedures, and surgery: Coronary artery bypass grafting.  Diagnostic transesophageal echocardiography. 2D and color Doppler. Birthdate: Patient birthdate: 04-Nov-1940. Age: Patient is 73 yr old. Sex: Gender: male. Blood pressure: 162/80 Patient status: Inpatient. Study date: Study date: 08/23/2013. Study time: 12:24 PM. Location: Endoscopy.  -------------------------------------------------------------------  ------------------------------------------------------------------- Left ventricle: The cavity size was normal. Wall thickness was increased in a pattern of moderate LVH. Systolic function was normal. The estimated ejection fraction was in the range of 65% to 70%. There was no dynamic obstruction.  ------------------------------------------------------------------- Aortic  valve: Structurally normal valve. Cusp separation was normal. No evidence of vegetation. Doppler: There was trivial regurgitation.  ------------------------------------------------------------------- Aorta: The aorta was normal, not dilated, and non-diseased.  ------------------------------------------------------------------- Mitral valve: Structurally normal valve. Leaflet separation was normal. There was mild systolic anterior motion of the anterior leaflet. There was no LVOT obstruction. No evidence of vegetation. Doppler: There was trivial regurgitation.  ------------------------------------------------------------------- Left atrium: The left atrial appendage is ligated in its mid portion. The basal appendage is still patent. The atrium was mildly to moderately dilated. No evidence of thrombus in the atrial cavity or appendage. No spontaneous echo contrast was observed. Emptying velocity was normal.  ------------------------------------------------------------------- Atrial septum: There was a very small patent foramen ovale. 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  ------------------------------------------------------------------- Right ventricle: The cavity size was normal. Wall thickness was normal. Systolic function was normal.  ------------------------------------------------------------------- Pulmonic valve: Structurally normal valve. Cusp separation was normal. No evidence of vegetation.  ------------------------------------------------------------------- Tricuspid valve: Structurally normal valve. Leaflet separation was normal. No evidence of vegetation. No evidence of vegetation. Doppler: There was mild regurgitation.  ------------------------------------------------------------------- Right atrium: The atrium was normal in size. No evidence of thrombus in the atrial cavity or appendage. No evidence of thrombus in the atrial cavity  or appendage.  ------------------------------------------------------------------- Pericardium: There was no pericardial effusion.  ------------------------------------------------------------------- Pleura: There was a left pleural effusion.  ------------------------------------------------------------------- Post procedure conclusions Ascending Aorta:  - The aorta was normal, not dilated, and non-diseased.  ------------------------------------------------------------------- Measurements  Pulmonary arteries Value 04/13/2013 Reference PA pressure, S, DP (H) 35 mm Hg ---------- <=30  Tricuspid valve Value 04/13/2013 Reference Tricuspid regurg peak 284.31 cm/s 317 --------- velocity Tricuspid peak RV-RA 32 mm Hg 40 --------- gradient Tricuspid maximal regurg 284.31 cm/s ---------- --------- velocity, PISA  Systemic veins Value 04/13/2013 Reference Estimated CVP 3 mm Hg ---------- ---------  Right ventricle Value 04/13/2013 Reference RV pressure, S, DP (H) 35 mm Hg ---------- <=30  Legend: (L) and (H) mark values outside specified reference range.  ------------------------------------------------------------------- Prepared and Electronically Authenticated by  Sanda Klein, MD 2015-08-19T18:18:49    CHEST 2 VIEW  COMPARISON: PA and lateral chest 09/26/2013. Single view of the  chest 08/21/2013. CT chest 03/09/2013.  FINDINGS:  The patient is status post CABG and clipping of the atrial  appendage. There is cardiomegaly and pulmonary edema. Left pleural  effusion and basilar airspace disease persist and appear slightly  worsened.  IMPRESSION:  Cardiomegaly and pulmonary edema.  Some increase in a small left effusion and basilar airspace disease,  likely atelectasis.  Electronically Signed  By: Inge Rise M.D.  On: 10/05/2013 13:27    2 channel telemetry rhythm strip demonstrates what appears to be atrial flutter with heart rate stable in the  60s   Impression:  The patient remains in atrial flutter which I suspect may be  atypical, perhaps left-sided.  He failed an attempt at cardioversion in August despite amiodarone therapy. He remains very symptomatic with persistent symptoms of exertional shortness of breath and fluid retention consistent with an acute exacerbation of chronic diastolic congestive heart failure. The patient has previously demonstrated the fact that he does not do well clinically when he is out of sinus rhythm.  I would favor an aggressive approach to attempt to reestablish sinus rhythm.  Followup chest x-ray performed 10/05/2013 revealed pulmonary edema and a relatively small persistent left pleural effusion.  I do not feel that the patient's small pleural effusion is large enough to mandate drainage at this time.    Plan:  We will plan to have the patient return in one month with repeat chest x-ray to make certain that his pleural effusion does not reaccumulate. I have instructed the patient to weigh himself every day to assist with long-term management of diuretic therapy.  We have not made any recommendations to change his medications at this time, but the patient understands that keeping his blood pressure under satisfactory control will be mandatory.  He will continue to followup with Dr. Acie Fredrickson for further adjustment in his medical therapy. Finally, we will refer the patient to Dr. Rayann Heman to consider possible EP mapping and ablation for his refractory atypical atrial flutter.   I spent in excess of 30 minutes during the conduct of this office consultation and >50% of this time involved direct face-to-face encounter with the patient for counseling and/or coordination of their care.  Valentina Gu. Roxy Manns, MD 10/16/2013 2:48 PM

## 2013-10-17 ENCOUNTER — Encounter (INDEPENDENT_AMBULATORY_CARE_PROVIDER_SITE_OTHER): Payer: Medicare Other | Admitting: Ophthalmology

## 2013-10-17 DIAGNOSIS — H338 Other retinal detachments: Secondary | ICD-10-CM

## 2013-10-17 DIAGNOSIS — I10 Essential (primary) hypertension: Secondary | ICD-10-CM

## 2013-10-17 DIAGNOSIS — H35033 Hypertensive retinopathy, bilateral: Secondary | ICD-10-CM

## 2013-10-17 DIAGNOSIS — H34832 Tributary (branch) retinal vein occlusion, left eye: Secondary | ICD-10-CM

## 2013-10-17 DIAGNOSIS — H43813 Vitreous degeneration, bilateral: Secondary | ICD-10-CM

## 2013-10-18 ENCOUNTER — Ambulatory Visit (INDEPENDENT_AMBULATORY_CARE_PROVIDER_SITE_OTHER): Payer: Medicare Other | Admitting: Internal Medicine

## 2013-10-18 ENCOUNTER — Other Ambulatory Visit: Payer: Self-pay | Admitting: Internal Medicine

## 2013-10-18 ENCOUNTER — Encounter: Payer: Self-pay | Admitting: Internal Medicine

## 2013-10-18 ENCOUNTER — Encounter (HOSPITAL_COMMUNITY): Payer: Self-pay | Admitting: Pharmacy Technician

## 2013-10-18 ENCOUNTER — Encounter: Payer: Self-pay | Admitting: *Deleted

## 2013-10-18 VITALS — BP 122/74 | HR 70 | Ht 71.0 in | Wt 192.0 lb

## 2013-10-18 DIAGNOSIS — Z951 Presence of aortocoronary bypass graft: Secondary | ICD-10-CM

## 2013-10-18 DIAGNOSIS — I480 Paroxysmal atrial fibrillation: Secondary | ICD-10-CM

## 2013-10-18 DIAGNOSIS — J90 Pleural effusion, not elsewhere classified: Secondary | ICD-10-CM

## 2013-10-18 DIAGNOSIS — Z7901 Long term (current) use of anticoagulants: Secondary | ICD-10-CM

## 2013-10-18 DIAGNOSIS — I119 Hypertensive heart disease without heart failure: Secondary | ICD-10-CM

## 2013-10-18 DIAGNOSIS — I43 Cardiomyopathy in diseases classified elsewhere: Secondary | ICD-10-CM

## 2013-10-18 DIAGNOSIS — I429 Cardiomyopathy, unspecified: Secondary | ICD-10-CM

## 2013-10-18 DIAGNOSIS — I483 Typical atrial flutter: Secondary | ICD-10-CM

## 2013-10-18 DIAGNOSIS — J948 Other specified pleural conditions: Secondary | ICD-10-CM

## 2013-10-18 LAB — BASIC METABOLIC PANEL
BUN: 30 mg/dL — AB (ref 6–23)
CO2: 31 mEq/L (ref 19–32)
CREATININE: 1.3 mg/dL (ref 0.4–1.5)
Calcium: 9.3 mg/dL (ref 8.4–10.5)
Chloride: 101 mEq/L (ref 96–112)
GFR: 55.78 mL/min — ABNORMAL LOW (ref >60.00)
Glucose, Bld: 174 mg/dL — ABNORMAL HIGH (ref 70–99)
Potassium: 3.6 mEq/L (ref 3.5–5.1)
Sodium: 139 mEq/L (ref 135–145)

## 2013-10-18 LAB — CBC WITH DIFFERENTIAL/PLATELET
BASOS ABS: 0 10*3/uL (ref 0.0–0.1)
Basophils Relative: 0.3 % (ref 0.0–3.0)
EOS ABS: 0.2 10*3/uL (ref 0.0–0.7)
Eosinophils Relative: 2.3 % (ref 0.0–5.0)
HEMATOCRIT: 38.7 % — AB (ref 39.0–52.0)
Hemoglobin: 12.7 g/dL — ABNORMAL LOW (ref 13.0–17.0)
Lymphocytes Relative: 9.7 % — ABNORMAL LOW (ref 12.0–46.0)
Lymphs Abs: 0.8 10*3/uL (ref 0.7–4.0)
MCHC: 32.8 g/dL (ref 30.0–36.0)
MCV: 93.6 fl (ref 78.0–100.0)
MONO ABS: 0.5 10*3/uL (ref 0.1–1.0)
Monocytes Relative: 6.6 % (ref 3.0–12.0)
Neutro Abs: 6.6 10*3/uL (ref 1.4–7.7)
Neutrophils Relative %: 81.1 % — ABNORMAL HIGH (ref 43.0–77.0)
PLATELETS: 323 10*3/uL (ref 150.0–400.0)
RBC: 4.13 Mil/uL — ABNORMAL LOW (ref 4.22–5.81)
RDW: 15.8 % — AB (ref 11.5–15.5)
WBC: 8.2 10*3/uL (ref 4.0–10.5)

## 2013-10-18 LAB — PROTIME-INR
INR: 2.1 ratio — AB (ref 0.8–1.0)
Prothrombin Time: 23.2 s — ABNORMAL HIGH (ref 9.6–13.1)

## 2013-10-18 NOTE — Progress Notes (Signed)
Primary Care Physician: Alesia Richards, MD Primary Cardiologist:  Dr Acie Fredrickson Referring Physician: Dr Juanda Crumble [name] is a 73 y.o. male with a h/o persistent atrial fibrillation s/p MAZE and LAA clipping 4/15 who presents for EP evaluation of atypical atrial flutter.  He reports that he was initially diagnosed with atrial fibrillation more than 10 years ago.  He was cardioverted several times by Dr Doreatha Lew.  He was evaluated by Dr Rolland Porter who recommended tikosyn and follow-up for ablation.  He did not return for follow-up but did very well on tikosyn for 8-9 years.  His afib became persistent and he underwent surgical MAZE and LAA clipping with CABG 4/15.  He was discharged on coumadin and amiodarone.  He has done reasonably well since that time.  He had L pleural effusion with subsequent drainage.  He has ongoing difficulty with SOB.  He has a small residual effusion.  8/15 he presented with atypical atrial flutter.  He was subsequently cardioverted and has improvement clinically.  Unfortunately, he has since returned to what appears to be the same atypical atrial flutter.  Though he is rate controlled, he remains symptomatic.  He is therefore referral for further EP evaluation. Today, he denies symptoms of chest pain, orthopnea, PND, lower extremity edema, dizziness, presyncope, syncope, or neurologic sequela. The patient is tolerating medications without difficulties and is otherwise without complaint today.    04/05/13  MAZE description (per Dr Guy Sandifer surgical note):  The Atricure Synergy ablation system (bipolar radiofrequency ablation clamp) is used for all radiofrequency ablation lesions for the maze procedure. The Atricure CryoICE nitrous oxide cryothermy ablation system is utilized for all cryothermy lesions.   An elliptical ablation lesion is created around the base of the left sided pulmonary veins. A similar elliptical lesion was created around the base of the left atrial appendage.    The left atrial appendage was obliterated by deploying a 45 mm Atricure left atrial appendage clip (Atriclip) across the appendage.  A left atriotomy incision was performed through the interatrial groove and extended partially across the back wall of the left atrium after opening the oblique sinus inferiorly.   An ablation lesion was placed around the right sided pulmonary veins using the bipolar clamp with one limb of the clamp along the endocardial surface and one along the epicardial surface posteriorly. A bipolar ablation lesion was placed across the dome of the left atrium from the cephalad apex of the atriotomy incision to reach the cephalad apex of the elliptical lesion around the left sided pulmonary veins. A similar bipolar lesion was placed across the back wall of the left atrium from the caudad apex of the atriotomy incision to reach the caudad apex of the elliptical lesion around the left sided pulmonary veins, thereby completing a box. Finally another bipolar lesion was placed across the back wall of the left atrium from the caudad apex of the atriotomy incision towards the posterior mitral valve annulus. This lesion was completed along the endocardial surface onto the posterior mitral annulus with a 3 minute duration cryothermy lesion, followed by a second cryothermy lesion along the posterior epicardial surface of the left atrium to the coronary sinus. This completes the entire left side lesion set of the Cox maze procedure.   A small hole was created in the right atrium and extended for several centimeters towards the lateral wall of the right atrium. The right side lesion set of the Cox maze procedure was performed using the bipolar radiofrequency ablation clamp  to create longitudinal lesion extending from the posterior apex of the right atriotomy incision along the lateral wall the right atrium to reach the lateral wall of the superior vena cava. A second lesion was placed in the opposite  direction to reach the lateral wall of the inferior vena cava. A lesion was then placed extending away from the right atriotomy incision along the lateral wall of the right atrium to reach the apex of the right atrial appendage. One final lesion was performed using cryothermy along the endocardial surface of the right atrium extending from the anterior apex of the right atriotomy incision at the acute margin to reach the tricuspid annulus at the 2 o'clock position.     Past Medical History  Diagnosis Date  . Hx of adenomatous colonic polyps   . Diabetes mellitus type II   . Hypertension   . Diverticulosis 2001  . Hyperlipidemia   . Persistent atrial fibrillation     a. on amio and coumadin;  b. s/p MAZE 04/2013 in setting of CABG.  . Obstructive sleep apnea     compliant with CPAP  . Chronic diastolic congestive heart failure   . Hypertensive cardiomyopathy   . History of cardioversion     x3 (years uncertain)  . H/O hiatal hernia   . Cancer     Basal cell carcinoma  . Adrenal adenoma   . CAD (coronary artery disease)     a. 04/2013 CABG x 2: LIMA to LAD, SVG to RI, EVH via R thigh.  . S/P Maze operation for atrial fibrillation     a. 04/2013: Complete bilateral atrial lesion set using cryothermy and bipolar radiofrequency ablation with clipping of LA appendage (@ time of CABG)  . Type II or unspecified type diabetes mellitus without mention of complication, not stated as uncontrolled   . GERD (gastroesophageal reflux disease)   . Depression   . Anxiety   . DJD (degenerative joint disease)   . Atypical atrial flutter 8/15, 10/15   Past Surgical History  Procedure Laterality Date  . Polinydal cyst      Removed  . Great toe arthrodesis, interphalangeal joint      Right foot  . Retina repair-right    . Cataract extraction      bilateral  . Basal cell carcinoma excision      x3 on face  . Polypectomy    . Cardiac catheterization      myocardial bridge but no cad  . Coronary  artery bypass graft N/A 04/05/2013    Procedure: CORONARY ARTERY BYPASS GRAFTING (CABG) TIMES TWO USING LEFT INTERNAL MAMMARY ARTERY AND RIGHT SAPHENOUS LEG VEIN HARVESTED ENDOSCOPICALLY;  Surgeon: Rexene Alberts, MD;  Location: [facility];  Service: Open Heart Surgery;  Laterality: N/A;  . Maze N/A 04/05/2013    Procedure: MAZE;  Surgeon: Rexene Alberts, MD;  Location: [facility];  Service: Open Heart Surgery;  Laterality: N/A;  . Intraoperative transesophageal echocardiogram N/A 04/05/2013    Procedure: INTRAOPERATIVE TRANSESOPHAGEAL ECHOCARDIOGRAM;  Surgeon: Rexene Alberts, MD;  Location: [facility];  Service: Open Heart Surgery;  Laterality: N/A;  . Tee without cardioversion N/A 08/23/2013    Procedure: TRANSESOPHAGEAL ECHOCARDIOGRAM (TEE);  Surgeon: Sanda Klein, MD;  Location: [facility];  Service: Cardiovascular;  Laterality: N/A;  . Cardioversion N/A 08/23/2013    Procedure: CARDIOVERSION;  Surgeon: Sanda Klein, MD;  Location: [facility];  Service: Cardiovascular;  Laterality: N/A;    Current Outpatient Prescriptions  Medication Sig Dispense Refill  .  acetaminophen (TYLENOL) 500 MG tablet Take 1,000 mg by mouth every 6 (six) hours as needed for mild pain.       [name] amiodarone (PACERONE) 200 MG tablet Take 200 mg by mouth daily.      [name] atorvastatin (LIPITOR) 20 MG tablet Take 20 mg by mouth 3 (three) times a week. Monday Wednesday and Friday      . BESIVANCE 0.6 % SUSP Place 1 drop into the left eye 4 (four) times daily as needed (after injection).       . cloNIDine (CATAPRES) 0.2 MG tablet Take 0.2 mg by mouth 2 (two) times daily.       . diphenhydrAMINE (BENADRYL) 25 MG tablet Take 25 mg by mouth every 6 (six) hours as needed for allergies.       . furosemide (LASIX) 40 MG tablet Take 40-80 mg by mouth 2 (two) times daily. Take 80 mg in the morning and 40 mg in the evening      . glimepiride (AMARYL) 2 MG tablet Take 2 mg by mouth daily with breakfast. If am fasting blood glucose is less than 120  hold amaryl dose.  For fasting blood glucose over 120 take 1/2 amaryl (1mg ).      . HYDROcodone-acetaminophen (VICODIN) 5-500 MG per tablet Take 1 tablet by mouth every 6 (six) hours as needed for pain.      . metFORMIN (GLUCOPHAGE-XR) 500 MG 24 hr tablet Take 1,000 mg by mouth 2 (two) times daily before a meal.       . minoxidil (LONITEN) 10 MG tablet Take 20 mg by mouth daily.       . ranitidine (ZANTAC) 150 MG tablet Take 150 mg by mouth daily as needed for heartburn.       . sertraline (ZOLOFT) 100 MG tablet Take 100 mg by mouth daily.      . valsartan (DIOVAN) 320 MG tablet Take 160 mg by mouth daily.      [name] warfarin (COUMADIN) 5 MG tablet Take 7.5 mg by mouth daily.       [name] ALPRAZolam (XANAX) 1 MG tablet Take 1 mg by mouth at bedtime as needed for anxiety or sleep.      . metoprolol tartrate (LOPRESSOR) 25 MG tablet Take 12.5 mg by mouth 2 (two) times daily.      . potassium chloride SA (K-DUR,KLOR-CON) 20 MEQ tablet Take 20 mEq by mouth 3 (three) times daily.       No current facility-administered medications for this visit.    Allergies  Allergen Reactions  . Sunflower Seed [Sunflower Oil] Swelling and Other (See Comments)    Tongue and lip swelling  . Horse-Derived Products Other (See Comments)    Based on skin test reaction  . Other Other (See Comments)    Tetanus Shot -- skin test reaction    History   Social History  . Marital Status: Married    Spouse Name: N/A    Number of Children: 1  . Years of Education: N/A   Occupational History  . retired Software engineer    Social History Main Topics  . Smoking status: Former Smoker -- 4.00 packs/day for 25 years    Types: Cigarettes    Quit date: 01/05/1981  . Smokeless tobacco: Never Used  . Alcohol Use: Yes     Comment: 1-5 drinks per week  . Drug Use: No  . Sexual Activity: Not on file   Other Topics Concern  . Not on file  Social History Narrative   Daily caffeine-yes   Patient gets regular exercise.   Pt lives  in [place] with spouse.  Retired Software engineer.  Family History  Problem Relation Age of Onset  . Colon cancer Mother     Family History/Uncle   . Colon polyps Mother     Family History  . Atrial fibrillation Mother   . Hypertension Mother   . Colon polyps Sister     Family history  . Diabetes Maternal Uncle   . Stroke Paternal Uncle   . Dementia Father     ROS- All systems are reviewed and negative except as per the HPI above  Physical Exam: Filed Vitals:   10/18/13 1028  BP: 122/74  Pulse: 70  Height: 5\' 11"  (1.803 m)  Weight: 192 lb (87.091 kg)  SpO2: 94%    GEN- The patient is well appearing, alert and oriented x 3 today.   Head- normocephalic, atraumatic Eyes-  Sclera clear, conjunctiva pink Ears- hearing intact Oropharynx- clear Neck- supple, no JVP Lungs- decreased BS at the L base with dullness  to percussion about 1/4 way up suggesting a small effusion, normal work of breathing Heart- irregular rate and rhythm, no murmurs, rubs or gallops, PMI not laterally displaced GI- soft, NT, ND, + BS Extremities- no clubbing, cyanosis, or edema MS- no significant deformity or atrophy Skin- no rash or lesion Psych- euthymic mood, full affect Neuro- strength and sensation are intact  EKG today reveals atypical atrial flutter  CXR 10/05/13 is personally reviewed and reveals a small effusion Echo 4/15 and TEE 8/15 are reviewed Epic records including Drs Roxy Manns, Melvyn Novas, and Nahsers notes are reviewed  Assessment and Plan:  1. Atypical atrial flutter post maze The patient presents for further EP evaluation of atypical atrial flutter.  He is s/p surgical maze (lesion set as described above).  I do not see any documented afib post ablation but have reviewed ekgs from 8/15 and 10/15 which reveal likely the same atypical atrial flutter.  His arrhythmia persists despite amiodarone.  He is symptomatic.  He is appropriately anticoagulated. I would advise EP study with ablation for his atrial arrhythmia at this time.  Therapeutic strategies including medicine and ablation were discussed in detail with the patient and his spouse today. Risk, benefits, and alternatives to EP study and radiofrequency ablation were also discussed in detail today. These risks include but are not limited to stroke, bleeding, vascular damage, tamponade, perforation, damage to the esophagus, lungs, and other structures, pulmonary vein stenosis, worsening renal function, radiation exposure, and death. The patient understands these risk and wishes to proceed.  We will therefore proceed with catheter ablation at the next available time.  Prior to catheter manipulation within the left atrium, TEE will again be performed.  2. Hypertensive cardiomyopathy Stable No change required today  3. CAD No ischemic symptoms No changes today  4.  Left pleural effusion Small by exam today Management per Dr Loetta Rough

## 2013-10-18 NOTE — Patient Instructions (Addendum)
Your physician has recommended that you have an ablation. Catheter ablation is a medical procedure used to treat some cardiac arrhythmias (irregular heartbeats). During catheter ablation, a long, thin, flexible tube is put into a blood vessel in your groin (upper thigh), or neck. This tube is called an ablation catheter. It is then guided to your heart through the blood vessel. Radio frequency waves destroy small areas of heart tissue where abnormal heartbeats may cause an arrhythmia to start. Please see the instruction sheet given to you today.  See instruction sheet for procedure  INR Wed 10/25/13  Your physician recommends that you return for lab work today BMP/CBC/INR   Cardiac Ablation Cardiac ablation is a procedure to disable a small amount of heart tissue in very specific places. The heart has many electrical connections. Sometimes these connections are abnormal and can cause the heart to beat very fast or irregularly. By disabling some of the problem areas, heart rhythm can be improved or made normal. Ablation is done for people who:   Have Wolff-Parkinson-White syndrome.   Have other fast heart rhythms (tachycardia).   Have taken medicines for an abnormal heart rhythm (arrhythmia) that resulted in:   No success.   Side effects.   May have a high-risk heartbeat that could result in death.  LET [facility] CARE PROVIDER KNOW ABOUT:   Any allergies you have or any previous reactions you have had to X-ray dye, food (such as seafood), medicine, or tape.   All medicines you are taking, including vitamins, herbs, eye drops, creams, and over-the-counter medicines.   Previous problems you or members of your family have had with the use of anesthetics.   Any blood disorders you have.   Previous surgeries or procedures (such as a kidney transplant) you have had.   Medical conditions you have (such as kidney failure).  RISKS AND COMPLICATIONS Generally, cardiac ablation  is a safe procedure. However, problems can occur and include:   Increased risk of cancer. Depending on how long it takes to do the ablation, the dose of radiation can be high.  Bruising and bleeding where a thin, flexible tube (catheter) was inserted during the procedure.   Bleeding into the chest, especially into the sac that surrounds the heart (serious).  Need for a permanent pacemaker if the normal electrical system is damaged.   The procedure may not be fully effective, and this may not be recognized for months. Repeat ablation procedures are sometimes required. BEFORE THE PROCEDURE   Follow any instructions from your health care provider regarding eating and drinking before the procedure.   Take your medicines as directed at regular times with water, unless instructed otherwise by your health care provider. If you are taking diabetes medicine, including insulin, ask how you are to take it and if there are any special instructions you should follow. It is common to adjust insulin dosing the day of the ablation.  PROCEDURE  An ablation is usually performed in a catheterization laboratory with the guidance of fluoroscopy. Fluoroscopy is a type of X-ray that helps your health care provider see images of your heart during the procedure.   An ablation is a minimally invasive procedure. This means a small cut (incision) is made in either your neck or groin. Your health care provider will decide where to make the incision based on your medical history and physical exam.  An IV tube will be started before the procedure begins. You will be given an anesthetic or medicine to  help you relax (sedative).  The skin on your neck or groin will be numbed. A needle will be inserted into a large vein in your neck or groin and catheters will be threaded to your heart.  A special dye that shows up on fluoroscopy pictures may be injected through the catheter. The dye helps your health care provider  see the area of the heart that needs treatment.  The catheter has electrodes on the tip. When the area of heart tissue that is causing the arrhythmia is found, the catheter tip will send an electrical current to the area and "scar" the tissue. Three types of energy can be used to ablate the heart tissue:   Heat (radiofrequency energy).   Laser energy.   Extreme cold (cryoablation).   When the area of the heart has been ablated, the catheter will be taken out. Pressure will be held on the insertion site. This will help the insertion site clot and keep it from bleeding. A bandage will be placed on the insertion site.  AFTER THE PROCEDURE   After the procedure, you will be taken to a recovery area where your vital signs (blood pressure, heart rate, and breathing) will be monitored. The insertion site will also be monitored for bleeding.   You will need to lie still for 4-6 hours. This is to ensure you do not bleed from the catheter insertion site.  Document Released: 05/10/2008 Document Revised: 05/08/2013 Document Reviewed: 05/16/2012 [facility] Patient Information 2015 [place], [place]. This information is not intended to replace advice given to you by your health care provider. Make sure you discuss any questions you have with your health care provider.

## 2013-10-21 ENCOUNTER — Encounter: Payer: Self-pay | Admitting: Internal Medicine

## 2013-10-23 ENCOUNTER — Ambulatory Visit (INDEPENDENT_AMBULATORY_CARE_PROVIDER_SITE_OTHER): Payer: Medicare Other | Admitting: Ophthalmology

## 2013-10-25 ENCOUNTER — Ambulatory Visit (INDEPENDENT_AMBULATORY_CARE_PROVIDER_SITE_OTHER): Payer: Medicare Other | Admitting: Internal Medicine

## 2013-10-25 ENCOUNTER — Other Ambulatory Visit: Payer: Self-pay | Admitting: *Deleted

## 2013-10-25 ENCOUNTER — Encounter: Payer: Self-pay | Admitting: Internal Medicine

## 2013-10-25 VITALS — BP 142/78 | HR 64 | Temp 98.1°F | Resp 16 | Ht 71.0 in | Wt 201.6 lb

## 2013-10-25 DIAGNOSIS — I4891 Unspecified atrial fibrillation: Secondary | ICD-10-CM

## 2013-10-25 DIAGNOSIS — I10 Essential (primary) hypertension: Secondary | ICD-10-CM

## 2013-10-25 DIAGNOSIS — Z7901 Long term (current) use of anticoagulants: Secondary | ICD-10-CM

## 2013-10-25 DIAGNOSIS — I483 Typical atrial flutter: Secondary | ICD-10-CM

## 2013-10-25 LAB — PROTIME-INR
INR: 2.22 — ABNORMAL HIGH (ref <1.50)
Prothrombin Time: 24.6 seconds — ABNORMAL HIGH (ref 11.6–15.2)

## 2013-10-25 NOTE — Progress Notes (Signed)
Patient ID: [name], male   DOB: Sep 16, 1940, 73 y.o.   MRN: [id number] Coumadin follow up Patient uis s/p CABG and MAZE for Multivessel CAD and Afib and has developed AFlutter and is scheduled for an Ablation tomorrow.  Patient is on Coumadin for Primary Diagnosis: Essential hypertension [I10] Patient's last INR is  Lab Results  Component Value Date   INR 2.1* 10/18/2013   INR 2.31* 09/26/2013   INR 1.73* 09/04/2013    Patient is self monitoring O2sats and reports resting O2 sat usu about 93%, but quickly drops to 86-88% if walks an incline or carries trash to the street. Patient denies SOB, CP, dizziness, nose bleeds, easy bleeding, and blood in stool/urine. His coumadin dose was not changed last visit.    Medication Sig  . acetaminophen 500 MG tablet Take 1,000 mg every 6  hours as needed   . ALPRAZolam  1 MG tablet Take 1 mg at bedtime as needed for anxiety.  [name] amiodarone  200 MG tablet Take 200 mg daily.  [name] atorvastatin  20 MG tablet Take 20 mg  3  times a week. Monday Wednesday and Friday  . BESIVANCE 0.6 % SUSP Place 1 drop into the left eye 4  times daily as needed (after injection).   . cloNIDine  0.2 MG tablet Take 0.2 mg  2 (two) times daily.   . diphenhydrAMINE 25 MG tablet Take 25 mg  every 6  hours as needed   . furosemide (LASIX) 40 MG tablet Take 80 mg in the morning and 40 mg in the evening  . glimepiride  2 MG tablet Take 2 mg by mouth daily with breakfast.  .  VICODIN 5-500  Take 1 tablet  every 6  hours as needed for pain.  . metFORMIN -XR 500 MG  Take 1,000 mg  2  times daily before a meal.   . metoprolol tartrate  25 MG tablet Take 12.5 mg  2 times daily.  . minoxidil 10 MG tablet Take 20 mg  daily.   . KCl  20 MEQ tablet Take 20 mEq  3  times daily.  . ranitidine 150 MG tablet Take 150 mg  daily as needed  . sertraline ( 100 MG tablet Take 100 mg  daily.  . valsartan  320 MG tablet Take 160 mgdaily.  [name] warfarin 5 MG tablet Take 7.5 mg  daily.    Past Medical  History  Diagnosis Date  . Hx of adenomatous colonic polyps   . Diabetes mellitus type II   . Hypertension   . Diverticulosis 2001  . Hyperlipidemia   . Persistent atrial fibrillation     a. on amio and coumadin;  b. s/p MAZE 04/2013 in setting of CABG.  . Obstructive sleep apnea     compliant with CPAP  . Chronic diastolic congestive heart failure   . Hypertensive cardiomyopathy   . History of cardioversion     x3 (years uncertain)  . H/O hiatal hernia   . Cancer     Basal cell carcinoma  . Adrenal adenoma   . CAD (coronary artery disease)     a. 04/2013 CABG x 2: LIMA to LAD, SVG to RI, EVH via R thigh.  . S/P Maze operation for atrial fibrillation     a. 04/2013: Complete bilateral atrial lesion set using cryothermy and bipolar radiofrequency ablation with clipping of LA appendage (@ time of CABG)  . Type II or unspecified type diabetes mellitus without mention  of complication, not stated as uncontrolled   . GERD (gastroesophageal reflux disease)   . Depression   . Anxiety   . DJD (degenerative joint disease)   . Atypical atrial flutter 8/15, 10/15   Allergies  Allergen Reactions  . Sunflower Seed [Sunflower Oil] Swelling and Other (See Comments)    Tongue and lip swelling  . Horse-Derived Products Other (See Comments)    Based on skin test reaction  . Other Other (See Comments)    Tetanus Shot -- skin test reaction   ROS Constitutional: Denies fever, chills, headaches, fatigue. Cardio: Denies chest pain, palpitations, irregular heartbeat, syncope, dyspnea, diaphoresis, orthopnea, PND, claudication, edema Respiratory: denies cough, pleurisy, hoarseness, laryngitis, wheezing.  Gastrointestinal: Denies dysphagia, heartburn, reflux, pain, cramps, nausea, diarrhea, constipation, hematemesis, melena, hematochezia Genitourinary: Denies dysuria, frequency, hematuria, flank pain Musculoskeletal: Denies arthralgia, myalgia, stiffness, Jt. Swelling, pain, limp,  strain/sprain. Skin: Denies rash, ecchymosis, petechial. Neuro: Denies Weakness, tremor, incoordination, spasms, paresthesia, pain Heme/Lymph: Denies Excessive bleeding, bruising, enlarged lymph nodes  Physical: Blood pressure 142/78, pulse 64, temperature 98.1 F (36.7 C), temperature source Temporal, resp. rate 16, height 5\' 11"  (1.803 m), weight 201 lb 9.6 oz (91.445 kg). Filed Weights   10/25/13 0901  Weight: 201 lb 9.6 oz (91.445 kg)    General Appearance: Well nourished, in no apparent distress. ENT/Mouth: Nares clear with no erythema, swelling, mucus on turbinates. No ulcers, cracking, on lips. No erythema, swelling, or exudate on post pharynx.  Neck: Supple, thyroid normal.  Respiratory: CTAB   Cardio: RRR, Gr 2 Sys  Murmurs & 1(+) ankle edema  Abdomen: Soft, with bowl sounds. Non tender, no guarding, rebound, hernias, masses, or organomegaly.  Skin: Warm, dry without rashes, lesions, ecchymosis.  Neuro: Unremarkable  Assessment and plan:  HTN ASHD s/p CABG & MAZE procedure A. Flutter Chronic anticoagulation- check INR and will adjust medication according to labs.  Discussed if patient falls to immediately contact office or go to ER. Discussed foods that can increase or decrease Coumadin levels. Patient understands to call the office before starting a new medication. Follow up in one month.

## 2013-10-26 ENCOUNTER — Ambulatory Visit (HOSPITAL_COMMUNITY): Payer: Medicare Other

## 2013-10-26 ENCOUNTER — Inpatient Hospital Stay (HOSPITAL_COMMUNITY)
Admission: RE | Admit: 2013-10-26 | Discharge: 2013-10-30 | DRG: 228 | Disposition: A | Discharge status: Home / Self Care | Payer: Medicare Other | Source: Ambulatory Visit | Attending: Internal Medicine | Admitting: Internal Medicine

## 2013-10-26 ENCOUNTER — Encounter (HOSPITAL_COMMUNITY)
Admission: RE | Disposition: A | Discharge status: Home / Self Care | Payer: Self-pay | Source: Ambulatory Visit | Attending: Internal Medicine

## 2013-10-26 ENCOUNTER — Ambulatory Visit (HOSPITAL_COMMUNITY): Payer: Medicare Other | Admitting: Certified Registered"

## 2013-10-26 ENCOUNTER — Encounter (HOSPITAL_COMMUNITY): Payer: Medicare Other | Admitting: Certified Registered"

## 2013-10-26 ENCOUNTER — Encounter (HOSPITAL_COMMUNITY): Payer: Self-pay | Admitting: Certified Registered"

## 2013-10-26 DIAGNOSIS — I5033 Acute on chronic diastolic (congestive) heart failure: Secondary | ICD-10-CM

## 2013-10-26 DIAGNOSIS — Q2112 Patent foramen ovale: Secondary | ICD-10-CM

## 2013-10-26 DIAGNOSIS — I2510 Atherosclerotic heart disease of native coronary artery without angina pectoris: Secondary | ICD-10-CM | POA: Diagnosis present

## 2013-10-26 DIAGNOSIS — I481 Persistent atrial fibrillation: Secondary | ICD-10-CM

## 2013-10-26 DIAGNOSIS — I4891 Unspecified atrial fibrillation: Secondary | ICD-10-CM

## 2013-10-26 DIAGNOSIS — Z794 Long term (current) use of insulin: Secondary | ICD-10-CM

## 2013-10-26 DIAGNOSIS — I484 Atypical atrial flutter: Secondary | ICD-10-CM

## 2013-10-26 DIAGNOSIS — I4892 Unspecified atrial flutter: Secondary | ICD-10-CM

## 2013-10-26 DIAGNOSIS — J90 Pleural effusion, not elsewhere classified: Secondary | ICD-10-CM

## 2013-10-26 DIAGNOSIS — J9621 Acute and chronic respiratory failure with hypoxia: Secondary | ICD-10-CM | POA: Diagnosis not present

## 2013-10-26 DIAGNOSIS — E876 Hypokalemia: Secondary | ICD-10-CM | POA: Diagnosis not present

## 2013-10-26 DIAGNOSIS — I43 Cardiomyopathy in diseases classified elsewhere: Secondary | ICD-10-CM | POA: Diagnosis present

## 2013-10-26 DIAGNOSIS — Z7901 Long term (current) use of anticoagulants: Secondary | ICD-10-CM

## 2013-10-26 DIAGNOSIS — D649 Anemia, unspecified: Secondary | ICD-10-CM | POA: Diagnosis present

## 2013-10-26 DIAGNOSIS — I110 Hypertensive heart disease with heart failure: Secondary | ICD-10-CM | POA: Diagnosis present

## 2013-10-26 DIAGNOSIS — F419 Anxiety disorder, unspecified: Secondary | ICD-10-CM | POA: Diagnosis present

## 2013-10-26 DIAGNOSIS — I480 Paroxysmal atrial fibrillation: Secondary | ICD-10-CM | POA: Diagnosis present

## 2013-10-26 DIAGNOSIS — E1121 Type 2 diabetes mellitus with diabetic nephropathy: Secondary | ICD-10-CM | POA: Diagnosis present

## 2013-10-26 DIAGNOSIS — F329 Major depressive disorder, single episode, unspecified: Secondary | ICD-10-CM | POA: Diagnosis present

## 2013-10-26 DIAGNOSIS — J9610 Chronic respiratory failure, unspecified whether with hypoxia or hypercapnia: Secondary | ICD-10-CM | POA: Diagnosis present

## 2013-10-26 DIAGNOSIS — E785 Hyperlipidemia, unspecified: Secondary | ICD-10-CM | POA: Diagnosis present

## 2013-10-26 DIAGNOSIS — E782 Mixed hyperlipidemia: Secondary | ICD-10-CM | POA: Diagnosis present

## 2013-10-26 DIAGNOSIS — I501 Left ventricular failure: Secondary | ICD-10-CM | POA: Diagnosis present

## 2013-10-26 DIAGNOSIS — Z833 Family history of diabetes mellitus: Secondary | ICD-10-CM

## 2013-10-26 DIAGNOSIS — I10 Essential (primary) hypertension: Secondary | ICD-10-CM | POA: Diagnosis present

## 2013-10-26 DIAGNOSIS — Z800 Family history of malignant neoplasm of digestive organs: Secondary | ICD-10-CM

## 2013-10-26 DIAGNOSIS — I341 Nonrheumatic mitral (valve) prolapse: Secondary | ICD-10-CM

## 2013-10-26 DIAGNOSIS — Z85828 Personal history of other malignant neoplasm of skin: Secondary | ICD-10-CM

## 2013-10-26 DIAGNOSIS — G4733 Obstructive sleep apnea (adult) (pediatric): Secondary | ICD-10-CM | POA: Diagnosis present

## 2013-10-26 DIAGNOSIS — N184 Chronic kidney disease, stage 4 (severe): Secondary | ICD-10-CM

## 2013-10-26 DIAGNOSIS — R0602 Shortness of breath: Secondary | ICD-10-CM

## 2013-10-26 DIAGNOSIS — E1169 Type 2 diabetes mellitus with other specified complication: Secondary | ICD-10-CM | POA: Diagnosis present

## 2013-10-26 DIAGNOSIS — I119 Hypertensive heart disease without heart failure: Secondary | ICD-10-CM

## 2013-10-26 DIAGNOSIS — K219 Gastro-esophageal reflux disease without esophagitis: Secondary | ICD-10-CM | POA: Diagnosis present

## 2013-10-26 DIAGNOSIS — Z8249 Family history of ischemic heart disease and other diseases of the circulatory system: Secondary | ICD-10-CM

## 2013-10-26 DIAGNOSIS — Q211 Atrial septal defect: Secondary | ICD-10-CM

## 2013-10-26 DIAGNOSIS — E1122 Type 2 diabetes mellitus with diabetic chronic kidney disease: Secondary | ICD-10-CM

## 2013-10-26 DIAGNOSIS — M1990 Unspecified osteoarthritis, unspecified site: Secondary | ICD-10-CM | POA: Diagnosis present

## 2013-10-26 DIAGNOSIS — Z951 Presence of aortocoronary bypass graft: Secondary | ICD-10-CM

## 2013-10-26 DIAGNOSIS — Z823 Family history of stroke: Secondary | ICD-10-CM

## 2013-10-26 HISTORY — DX: Atrial septal defect: Q21.1

## 2013-10-26 HISTORY — DX: Respiratory failure, unspecified, unspecified whether with hypoxia or hypercapnia: J96.90

## 2013-10-26 HISTORY — PX: TEE WITHOUT CARDIOVERSION: SHX5443

## 2013-10-26 HISTORY — DX: Basal cell carcinoma of skin, unspecified: C44.91

## 2013-10-26 HISTORY — DX: Patent foramen ovale: Q21.12

## 2013-10-26 HISTORY — DX: Pleural effusion, not elsewhere classified: J90

## 2013-10-26 HISTORY — PX: ATRIAL FIBRILLATION ABLATION: SHX5456

## 2013-10-26 LAB — POCT ACTIVATED CLOTTING TIME
ACTIVATED CLOTTING TIME: 180 s
Activated Clotting Time: 264 seconds
Activated Clotting Time: 259 seconds
Activated Clotting Time: 281 seconds

## 2013-10-26 LAB — GLUCOSE, CAPILLARY: GLUCOSE-CAPILLARY: 159 mg/dL — AB (ref 70–99)

## 2013-10-26 LAB — PROTIME-INR
INR: 2.24 — ABNORMAL HIGH (ref 0.00–1.49)
PROTHROMBIN TIME: 25 s — AB (ref 11.6–15.2)

## 2013-10-26 LAB — MRSA PCR SCREENING: MRSA by PCR: NEGATIVE

## 2013-10-26 SURGERY — ECHOCARDIOGRAM, TRANSESOPHAGEAL
Anesthesia: Moderate Sedation

## 2013-10-26 SURGERY — ATRIAL FIBRILLATION ABLATION
Anesthesia: General

## 2013-10-26 MED ORDER — FENTANYL CITRATE 0.05 MG/ML IJ SOLN
INTRAMUSCULAR | Status: AC
  Filled 2013-10-26: qty 2

## 2013-10-26 MED ORDER — SODIUM CHLORIDE 0.9 % IV SOLN
2.0000 ug/min | INTRAVENOUS | Status: DC
  Filled 2013-10-26: qty 2

## 2013-10-26 MED ORDER — WARFARIN SODIUM 7.5 MG PO TABS
7.5000 mg | ORAL_TABLET | Freq: Once | ORAL | Status: AC
  Administered 2013-10-26: 7.5 mg via ORAL
  Filled 2013-10-26: qty 1

## 2013-10-26 MED ORDER — MIDAZOLAM HCL 5 MG/5ML IJ SOLN
INTRAMUSCULAR | Status: AC
  Filled 2013-10-26: qty 5

## 2013-10-26 MED ORDER — OXYCODONE HCL 5 MG PO TABS: 5.0000 mg | ORAL_TABLET | Freq: Once | ORAL | Status: AC | PRN

## 2013-10-26 MED ORDER — MIDAZOLAM HCL 5 MG/ML IJ SOLN
INTRAMUSCULAR | Status: AC
  Filled 2013-10-26: qty 2

## 2013-10-26 MED ORDER — FUROSEMIDE 80 MG PO TABS
80.0000 mg | ORAL_TABLET | Freq: Every day | ORAL | Status: DC
  Filled 2013-10-26 (×2): qty 1

## 2013-10-26 MED ORDER — WARFARIN - PHYSICIAN DOSING INPATIENT: Freq: Every day | Status: DC

## 2013-10-26 MED ORDER — SODIUM CHLORIDE 0.9 % IV SOLN
INTRAVENOUS | Status: DC
  Administered 2013-10-26: 20 mL/h via INTRAVENOUS
  Administered 2013-10-26: 10:00:00 via INTRAVENOUS

## 2013-10-26 MED ORDER — ALPRAZOLAM 0.5 MG PO TABS
1.0000 mg | ORAL_TABLET | Freq: Every evening | ORAL | Status: DC | PRN
  Administered 2013-10-26 – 2013-10-29 (×4): 1 mg via ORAL
  Filled 2013-10-26 (×4): qty 2

## 2013-10-26 MED ORDER — PROTAMINE SULFATE 10 MG/ML IV SOLN
INTRAVENOUS | Status: DC | PRN
  Administered 2013-10-26: 30 mg via INTRAVENOUS

## 2013-10-26 MED ORDER — FUROSEMIDE 10 MG/ML IJ SOLN
INTRAMUSCULAR | Status: AC
  Administered 2013-10-26: 80 mg via INTRAVENOUS
  Filled 2013-10-26: qty 8

## 2013-10-26 MED ORDER — HEPARIN SODIUM (PORCINE) 1000 UNIT/ML IJ SOLN
INTRAMUSCULAR | Status: AC
  Filled 2013-10-26: qty 1

## 2013-10-26 MED ORDER — FENTANYL CITRATE 0.05 MG/ML IJ SOLN: 25.0000 ug | INTRAMUSCULAR | Status: DC | PRN

## 2013-10-26 MED ORDER — LIDOCAINE HCL (CARDIAC) 20 MG/ML IV SOLN
INTRAVENOUS | Status: DC | PRN
  Administered 2013-10-26: 60 mg via INTRAVENOUS

## 2013-10-26 MED ORDER — BUTAMBEN-TETRACAINE-BENZOCAINE 2-2-14 % EX AERO
INHALATION_SPRAY | CUTANEOUS | Status: DC | PRN
  Administered 2013-10-26: 2 via TOPICAL

## 2013-10-26 MED ORDER — SODIUM CHLORIDE 0.9 % IJ SOLN
3.0000 mL | Freq: Two times a day (BID) | INTRAMUSCULAR | Status: DC
  Administered 2013-10-26 – 2013-10-30 (×8): 3 mL via INTRAVENOUS

## 2013-10-26 MED ORDER — SODIUM CHLORIDE 0.9 % IV SOLN: 250.0000 mL | INTRAVENOUS | Status: DC | PRN

## 2013-10-26 MED ORDER — ONDANSETRON HCL 4 MG/2ML IJ SOLN
INTRAMUSCULAR | Status: DC | PRN
  Administered 2013-10-26: 4 mg via INTRAVENOUS

## 2013-10-26 MED ORDER — POTASSIUM CHLORIDE CRYS ER 20 MEQ PO TBCR
20.0000 meq | EXTENDED_RELEASE_TABLET | Freq: Three times a day (TID) | ORAL | Status: DC
  Administered 2013-10-26 – 2013-10-28 (×8): 20 meq via ORAL
  Filled 2013-10-26 (×11): qty 1

## 2013-10-26 MED ORDER — CLONIDINE HCL 0.2 MG PO TABS
0.2000 mg | ORAL_TABLET | Freq: Two times a day (BID) | ORAL | Status: DC
  Administered 2013-10-27 – 2013-10-30 (×7): 0.2 mg via ORAL
  Filled 2013-10-26 (×8): qty 1

## 2013-10-26 MED ORDER — MIDAZOLAM HCL 10 MG/2ML IJ SOLN
INTRAMUSCULAR | Status: DC | PRN
Start: 2013-10-26 — End: 2013-10-26
  Administered 2013-10-26 (×2): 1 mg via INTRAVENOUS
  Administered 2013-10-26: 2 mg via INTRAVENOUS

## 2013-10-26 MED ORDER — SODIUM CHLORIDE 0.9 % IJ SOLN
3.0000 mL | INTRAMUSCULAR | Status: DC | PRN
  Administered 2013-10-29: 3 mL via INTRAVENOUS

## 2013-10-26 MED ORDER — HEPARIN SODIUM (PORCINE) 1000 UNIT/ML IJ SOLN
INTRAMUSCULAR | Status: DC | PRN
  Administered 2013-10-26: 3000 [IU] via INTRAVENOUS
  Administered 2013-10-26: 4000 [IU] via INTRAVENOUS
  Administered 2013-10-26: 12000 [IU] via INTRAVENOUS

## 2013-10-26 MED ORDER — ACETAMINOPHEN 325 MG PO TABS
650.0000 mg | ORAL_TABLET | ORAL | Status: DC | PRN
  Administered 2013-10-28: 650 mg via ORAL
  Administered 2013-10-28: 325 mg via ORAL
  Filled 2013-10-26 (×3): qty 2

## 2013-10-26 MED ORDER — FUROSEMIDE 10 MG/ML IJ SOLN
80.0000 mg | Freq: Once | INTRAMUSCULAR | Status: AC
  Administered 2013-10-26: 80 mg via INTRAVENOUS

## 2013-10-26 MED ORDER — OXYCODONE HCL 5 MG/5ML PO SOLN: 5.0000 mg | Freq: Once | ORAL | Status: AC | PRN

## 2013-10-26 MED ORDER — FUROSEMIDE 40 MG PO TABS
40.0000 mg | ORAL_TABLET | Freq: Every day | ORAL | Status: DC
  Administered 2013-10-26 – 2013-10-27 (×2): 40 mg via ORAL
  Filled 2013-10-26 (×3): qty 1

## 2013-10-26 MED ORDER — FUROSEMIDE 10 MG/ML IJ SOLN
INTRAMUSCULAR | Status: AC
  Filled 2013-10-26: qty 4

## 2013-10-26 MED ORDER — HYDROCODONE-ACETAMINOPHEN 5-325 MG PO TABS
1.0000 | ORAL_TABLET | ORAL | Status: DC | PRN
  Administered 2013-10-26 – 2013-10-27 (×4): 2 via ORAL
  Administered 2013-10-27 – 2013-10-28 (×2): 1 via ORAL
  Administered 2013-10-28 (×2): 2 via ORAL
  Administered 2013-10-29: 1 via ORAL
  Filled 2013-10-26 (×3): qty 2
  Filled 2013-10-26 (×3): qty 1
  Filled 2013-10-26 (×3): qty 2

## 2013-10-26 MED ORDER — ONDANSETRON HCL 4 MG/2ML IJ SOLN: 4.0000 mg | Freq: Four times a day (QID) | INTRAMUSCULAR | Status: DC | PRN

## 2013-10-26 MED ORDER — SERTRALINE HCL 100 MG PO TABS
100.0000 mg | ORAL_TABLET | Freq: Every day | ORAL | Status: DC
  Administered 2013-10-26 – 2013-10-30 (×5): 100 mg via ORAL
  Filled 2013-10-26 (×5): qty 1

## 2013-10-26 MED ORDER — FUROSEMIDE 10 MG/ML IJ SOLN
INTRAMUSCULAR | Status: DC | PRN
  Administered 2013-10-26: 20 mg via INTRAMUSCULAR

## 2013-10-26 MED ORDER — GLIMEPIRIDE 2 MG PO TABS
2.0000 mg | ORAL_TABLET | Freq: Every day | ORAL | Status: DC
  Administered 2013-10-27 – 2013-10-30 (×4): 2 mg via ORAL
  Filled 2013-10-26 (×5): qty 1

## 2013-10-26 MED ORDER — PROPOFOL 10 MG/ML IV BOLUS
INTRAVENOUS | Status: DC | PRN
  Administered 2013-10-26: 150 mg via INTRAVENOUS
  Administered 2013-10-26: 50 mg via INTRAVENOUS

## 2013-10-26 MED ORDER — FENTANYL CITRATE 0.05 MG/ML IJ SOLN
INTRAMUSCULAR | Status: DC | PRN
  Administered 2013-10-26: 25 ug via INTRAVENOUS
  Administered 2013-10-26 (×2): 12.5 ug via INTRAVENOUS

## 2013-10-26 MED ORDER — ISOPROTERENOL HCL 0.2 MG/ML IJ SOLN
2.0000 ug/min | INTRAVENOUS | Status: DC
  Filled 2013-10-26: qty 5

## 2013-10-26 MED ORDER — BUPIVACAINE HCL (PF) 0.25 % IJ SOLN
INTRAMUSCULAR | Status: AC
  Filled 2013-10-26: qty 30

## 2013-10-26 MED ORDER — FUROSEMIDE 40 MG PO TABS: 40.0000 mg | ORAL_TABLET | Freq: Two times a day (BID) | ORAL | Status: DC

## 2013-10-26 NOTE — Anesthesia Preprocedure Evaluation (Addendum)
Anesthesia Evaluation  Patient identified by MRN, date of birth, ID band Patient awake    Reviewed: Allergy & Precautions, H&P , NPO status , Patient's Chart, lab work & pertinent test results  Airway Mallampati: II  Neck ROM: full    Dental  (+) Dental Advisory Given   Pulmonary shortness of breath, sleep apnea , former smoker,          Cardiovascular hypertension, + CAD, + CABG and +CHF     Neuro/Psych Anxiety Depression    GI/Hepatic hiatal hernia, GERD-  ,  Endo/Other  diabetes, Type 2  Renal/GU      Musculoskeletal  (+) Arthritis -,   Abdominal   Peds  Hematology   Anesthesia Other Findings   Reproductive/Obstetrics                          Anesthesia Physical Anesthesia Plan  ASA: III  Anesthesia Plan: General   Post-op Pain Management:    Induction: Intravenous  Airway Management Planned: LMA  Additional Equipment:   Intra-op Plan:   Post-operative Plan:   Informed Consent: I have reviewed the patients History and Physical, chart, labs and discussed the procedure including the risks, benefits and alternatives for the proposed anesthesia with the patient or authorized representative who has indicated his/her understanding and acceptance.     Plan Discussed with: CRNA, Anesthesiologist and Surgeon  Anesthesia Plan Comments:         Anesthesia Quick Evaluation

## 2013-10-26 NOTE — Progress Notes (Signed)
Site area: RFV Site Prior to Removal:  Level 0 Pressure Applied For: 20MIN Manual:   YES Patient Status During Pull:  STABLE Post Pull Site:  Level0 Post Pull Instructions Given:  YES Post Pull Pulses Present: YES PALPABLE RT DP  Dressing Applied:  YES Bedrest begins @ 14:50 Comments:

## 2013-10-26 NOTE — Anesthesia Postprocedure Evaluation (Signed)
  Anesthesia Post-op Note  Patient: [name]  Procedure(s) Performed: Procedure(s): ATRIAL FIBRILLATION ABLATION (N/A)  Patient Location: PACU  Anesthesia Type:General  Level of Consciousness: awake, alert  and oriented  Airway and Oxygen Therapy: Patient Spontanous Breathing  Post-op Pain: none  Post-op Assessment: Post-op Vital signs reviewed, Patient's Cardiovascular Status Stable and Respiratory Function Stable  Post-op Vital Signs: Reviewed and stable  Last Vitals:  Filed Vitals:   10/26/13 1600  BP: 161/79  Pulse:   Temp:   Resp: 27    Complications: No apparent anesthesia complications

## 2013-10-26 NOTE — CV Procedure (Signed)
    PROCEDURE NOTE  Procedure:Transesophageal echocardiogram Operator: Fransico Him, MD Indications:  Afib ablation IV Meds:Versed 3mg , Fentanyl [id number] IV Complications:None  Results: Normal LV size and function Normal RV size and function Normal TV with mild TR Normal MV with mild MR Normal PV with mild PR Normal trileaflet AV with no AR RA is moderately dilated with no evidence of thrombus. LA is moderately dilated.  No evidence of thrombus.  The LA appendage was clipped during his MAZE procedure but he still has a small residual appendage with no thrombus noted.   The interatrial septum is hypermobile with evidence of small PFO by color flow doppler and agitated saline contrast injection with bidirectional flow. Moderate sized left pleural effusion Normal thoracic and ascending aorta  The patient tolerated the procedure well with no complications and was transferred back to his room in stable condition.  Will check a chest xray prior to ablation to further assess pleural effusion.  Signed: Fransico Him, MD [facility] 10/26/2013

## 2013-10-26 NOTE — Progress Notes (Signed)
Pt sent to radiology for chest xray, and then to cath lab for ablation.VSS, denies pain...sats 93%

## 2013-10-26 NOTE — Interval H&P Note (Signed)
History and Physical Interval Note:  10/26/2013 8:03 AM  [name]  has presented today for surgery, with the diagnosis of afib  The various methods of treatment have been discussed with the patient and family. After consideration of risks, benefits and other options for treatment, the patient has consented to  Procedure(s): ATRIAL FIBRILLATION ABLATION (N/A) as a surgical intervention .  The patient's history has been reviewed, patient examined, no change in status, stable for surgery.  I have reviewed the patient's chart and labs.  Questions were answered to the patient's satisfaction.     Thompson Grayer

## 2013-10-26 NOTE — H&P (View-Only) (Signed)
Primary Care Physician: Alesia Richards, MD Primary Cardiologist:  Dr Acie Fredrickson Referring Physician: Dr Juanda Crumble [name] is a 73 y.o. male with a h/o persistent atrial fibrillation s/p MAZE and LAA clipping 4/15 who presents for EP evaluation of atypical atrial flutter.  He reports that he was initially diagnosed with atrial fibrillation more than 10 years ago.  He was cardioverted several times by Dr Doreatha Lew.  He was evaluated by Dr Rolland Porter who recommended tikosyn and follow-up for ablation.  He did not return for follow-up but did very well on tikosyn for 8-9 years.  His afib became persistent and he underwent surgical MAZE and LAA clipping with CABG 4/15.  He was discharged on coumadin and amiodarone.  He has done reasonably well since that time.  He had L pleural effusion with subsequent drainage.  He has ongoing difficulty with SOB.  He has a small residual effusion.  8/15 he presented with atypical atrial flutter.  He was subsequently cardioverted and has improvement clinically.  Unfortunately, he has since returned to what appears to be the same atypical atrial flutter.  Though he is rate controlled, he remains symptomatic.  He is therefore referral for further EP evaluation. Today, he denies symptoms of chest pain, orthopnea, PND, lower extremity edema, dizziness, presyncope, syncope, or neurologic sequela. The patient is tolerating medications without difficulties and is otherwise without complaint today.    04/05/13  MAZE description (per Dr Guy Sandifer surgical note):  The Atricure Synergy ablation system (bipolar radiofrequency ablation clamp) is used for all radiofrequency ablation lesions for the maze procedure. The Atricure CryoICE nitrous oxide cryothermy ablation system is utilized for all cryothermy lesions.   An elliptical ablation lesion is created around the base of the left sided pulmonary veins. A similar elliptical lesion was created around the base of the left atrial appendage.    The left atrial appendage was obliterated by deploying a 45 mm Atricure left atrial appendage clip (Atriclip) across the appendage.  A left atriotomy incision was performed through the interatrial groove and extended partially across the back wall of the left atrium after opening the oblique sinus inferiorly.   An ablation lesion was placed around the right sided pulmonary veins using the bipolar clamp with one limb of the clamp along the endocardial surface and one along the epicardial surface posteriorly. A bipolar ablation lesion was placed across the dome of the left atrium from the cephalad apex of the atriotomy incision to reach the cephalad apex of the elliptical lesion around the left sided pulmonary veins. A similar bipolar lesion was placed across the back wall of the left atrium from the caudad apex of the atriotomy incision to reach the caudad apex of the elliptical lesion around the left sided pulmonary veins, thereby completing a box. Finally another bipolar lesion was placed across the back wall of the left atrium from the caudad apex of the atriotomy incision towards the posterior mitral valve annulus. This lesion was completed along the endocardial surface onto the posterior mitral annulus with a 3 minute duration cryothermy lesion, followed by a second cryothermy lesion along the posterior epicardial surface of the left atrium to the coronary sinus. This completes the entire left side lesion set of the Cox maze procedure.   A small hole was created in the right atrium and extended for several centimeters towards the lateral wall of the right atrium. The right side lesion set of the Cox maze procedure was performed using the bipolar radiofrequency ablation clamp  to create longitudinal lesion extending from the posterior apex of the right atriotomy incision along the lateral wall the right atrium to reach the lateral wall of the superior vena cava. A second lesion was placed in the opposite  direction to reach the lateral wall of the inferior vena cava. A lesion was then placed extending away from the right atriotomy incision along the lateral wall of the right atrium to reach the apex of the right atrial appendage. One final lesion was performed using cryothermy along the endocardial surface of the right atrium extending from the anterior apex of the right atriotomy incision at the acute margin to reach the tricuspid annulus at the 2 o'clock position.     Past Medical History  Diagnosis Date  . Hx of adenomatous colonic polyps   . Diabetes mellitus type II   . Hypertension   . Diverticulosis 2001  . Hyperlipidemia   . Persistent atrial fibrillation     a. on amio and coumadin;  b. s/p MAZE 04/2013 in setting of CABG.  . Obstructive sleep apnea     compliant with CPAP  . Chronic diastolic congestive heart failure   . Hypertensive cardiomyopathy   . History of cardioversion     x3 (years uncertain)  . H/O hiatal hernia   . Cancer     Basal cell carcinoma  . Adrenal adenoma   . CAD (coronary artery disease)     a. 04/2013 CABG x 2: LIMA to LAD, SVG to RI, EVH via R thigh.  . S/P Maze operation for atrial fibrillation     a. 04/2013: Complete bilateral atrial lesion set using cryothermy and bipolar radiofrequency ablation with clipping of LA appendage (@ time of CABG)  . Type II or unspecified type diabetes mellitus without mention of complication, not stated as uncontrolled   . GERD (gastroesophageal reflux disease)   . Depression   . Anxiety   . DJD (degenerative joint disease)   . Atypical atrial flutter 8/15, 10/15   Past Surgical History  Procedure Laterality Date  . Polinydal cyst      Removed  . Great toe arthrodesis, interphalangeal joint      Right foot  . Retina repair-right    . Cataract extraction      bilateral  . Basal cell carcinoma excision      x3 on face  . Polypectomy    . Cardiac catheterization      myocardial bridge but no cad  . Coronary  artery bypass graft N/A 04/05/2013    Procedure: CORONARY ARTERY BYPASS GRAFTING (CABG) TIMES TWO USING LEFT INTERNAL MAMMARY ARTERY AND RIGHT SAPHENOUS LEG VEIN HARVESTED ENDOSCOPICALLY;  Surgeon: Rexene Alberts, MD;  Location: [facility];  Service: Open Heart Surgery;  Laterality: N/A;  . Maze N/A 04/05/2013    Procedure: MAZE;  Surgeon: Rexene Alberts, MD;  Location: [facility];  Service: Open Heart Surgery;  Laterality: N/A;  . Intraoperative transesophageal echocardiogram N/A 04/05/2013    Procedure: INTRAOPERATIVE TRANSESOPHAGEAL ECHOCARDIOGRAM;  Surgeon: Rexene Alberts, MD;  Location: [facility];  Service: Open Heart Surgery;  Laterality: N/A;  . Tee without cardioversion N/A 08/23/2013    Procedure: TRANSESOPHAGEAL ECHOCARDIOGRAM (TEE);  Surgeon: Sanda Klein, MD;  Location: [facility];  Service: Cardiovascular;  Laterality: N/A;  . Cardioversion N/A 08/23/2013    Procedure: CARDIOVERSION;  Surgeon: Sanda Klein, MD;  Location: [facility];  Service: Cardiovascular;  Laterality: N/A;    Current Outpatient Prescriptions  Medication Sig Dispense Refill  .  acetaminophen (TYLENOL) 500 MG tablet Take 1,000 mg by mouth every 6 (six) hours as needed for mild pain.       [name] amiodarone (PACERONE) 200 MG tablet Take 200 mg by mouth daily.      [name] atorvastatin (LIPITOR) 20 MG tablet Take 20 mg by mouth 3 (three) times a week. Monday Wednesday and Friday      . BESIVANCE 0.6 % SUSP Place 1 drop into the left eye 4 (four) times daily as needed (after injection).       . cloNIDine (CATAPRES) 0.2 MG tablet Take 0.2 mg by mouth 2 (two) times daily.       . diphenhydrAMINE (BENADRYL) 25 MG tablet Take 25 mg by mouth every 6 (six) hours as needed for allergies.       . furosemide (LASIX) 40 MG tablet Take 40-80 mg by mouth 2 (two) times daily. Take 80 mg in the morning and 40 mg in the evening      . glimepiride (AMARYL) 2 MG tablet Take 2 mg by mouth daily with breakfast. If am fasting blood glucose is less than 120  hold amaryl dose.  For fasting blood glucose over 120 take 1/2 amaryl (1mg ).      . HYDROcodone-acetaminophen (VICODIN) 5-500 MG per tablet Take 1 tablet by mouth every 6 (six) hours as needed for pain.      . metFORMIN (GLUCOPHAGE-XR) 500 MG 24 hr tablet Take 1,000 mg by mouth 2 (two) times daily before a meal.       . minoxidil (LONITEN) 10 MG tablet Take 20 mg by mouth daily.       . ranitidine (ZANTAC) 150 MG tablet Take 150 mg by mouth daily as needed for heartburn.       . sertraline (ZOLOFT) 100 MG tablet Take 100 mg by mouth daily.      . valsartan (DIOVAN) 320 MG tablet Take 160 mg by mouth daily.      [name] warfarin (COUMADIN) 5 MG tablet Take 7.5 mg by mouth daily.       [name] ALPRAZolam (XANAX) 1 MG tablet Take 1 mg by mouth at bedtime as needed for anxiety or sleep.      . metoprolol tartrate (LOPRESSOR) 25 MG tablet Take 12.5 mg by mouth 2 (two) times daily.      . potassium chloride SA (K-DUR,KLOR-CON) 20 MEQ tablet Take 20 mEq by mouth 3 (three) times daily.       No current facility-administered medications for this visit.    Allergies  Allergen Reactions  . Sunflower Seed [Sunflower Oil] Swelling and Other (See Comments)    Tongue and lip swelling  . Horse-Derived Products Other (See Comments)    Based on skin test reaction  . Other Other (See Comments)    Tetanus Shot -- skin test reaction    History   Social History  . Marital Status: Married    Spouse Name: N/A    Number of Children: 1  . Years of Education: N/A   Occupational History  . retired Software engineer    Social History Main Topics  . Smoking status: Former Smoker -- 4.00 packs/day for 25 years    Types: Cigarettes    Quit date: 01/05/1981  . Smokeless tobacco: Never Used  . Alcohol Use: Yes     Comment: 1-5 drinks per week  . Drug Use: No  . Sexual Activity: Not on file   Other Topics Concern  . Not on file  Social History Narrative   Daily caffeine-yes   Patient gets regular exercise.   Pt lives  in [place] with spouse.  Retired Software engineer.  Family History  Problem Relation Age of Onset  . Colon cancer Mother     Family History/Uncle   . Colon polyps Mother     Family History  . Atrial fibrillation Mother   . Hypertension Mother   . Colon polyps Sister     Family history  . Diabetes Maternal Uncle   . Stroke Paternal Uncle   . Dementia Father     ROS- All systems are reviewed and negative except as per the HPI above  Physical Exam: Filed Vitals:   10/18/13 1028  BP: 122/74  Pulse: 70  Height: 5\' 11"  (1.803 m)  Weight: 192 lb (87.091 kg)  SpO2: 94%    GEN- The patient is well appearing, alert and oriented x 3 today.   Head- normocephalic, atraumatic Eyes-  Sclera clear, conjunctiva pink Ears- hearing intact Oropharynx- clear Neck- supple, no JVP Lungs- decreased BS at the L base with dullness  to percussion about 1/4 way up suggesting a small effusion, normal work of breathing Heart- irregular rate and rhythm, no murmurs, rubs or gallops, PMI not laterally displaced GI- soft, NT, ND, + BS Extremities- no clubbing, cyanosis, or edema MS- no significant deformity or atrophy Skin- no rash or lesion Psych- euthymic mood, full affect Neuro- strength and sensation are intact  EKG today reveals atypical atrial flutter  CXR 10/05/13 is personally reviewed and reveals a small effusion Echo 4/15 and TEE 8/15 are reviewed Epic records including Drs Roxy Manns, Melvyn Novas, and Nahsers notes are reviewed  Assessment and Plan:  1. Atypical atrial flutter post maze The patient presents for further EP evaluation of atypical atrial flutter.  He is s/p surgical maze (lesion set as described above).  I do not see any documented afib post ablation but have reviewed ekgs from 8/15 and 10/15 which reveal likely the same atypical atrial flutter.  His arrhythmia persists despite amiodarone.  He is symptomatic.  He is appropriately anticoagulated. I would advise EP study with ablation for his atrial arrhythmia at this time.  Therapeutic strategies including medicine and ablation were discussed in detail with the patient and his spouse today. Risk, benefits, and alternatives to EP study and radiofrequency ablation were also discussed in detail today. These risks include but are not limited to stroke, bleeding, vascular damage, tamponade, perforation, damage to the esophagus, lungs, and other structures, pulmonary vein stenosis, worsening renal function, radiation exposure, and death. The patient understands these risk and wishes to proceed.  We will therefore proceed with catheter ablation at the next available time.  Prior to catheter manipulation within the left atrium, TEE will again be performed.  2. Hypertensive cardiomyopathy Stable No change required today  3. CAD No ischemic symptoms No changes today  4.  Left pleural effusion Small by exam today Management per Dr Loetta Rough

## 2013-10-26 NOTE — Progress Notes (Signed)
Utilization Review Completed.Tija Biss T10/22/2015  

## 2013-10-26 NOTE — Progress Notes (Signed)
Pt arrived to Holding Area for a EP study to be done alert and oriented X4.  Pt denies any discomfort at this time. Consent signed on chart , Name , DOB and procedure verified.  IV in left hand redressed and infusing with NS at 20cc /hr per MD order.  Pt was able to void 250 cc of clear yellow urine.  EP staff in to see pt.

## 2013-10-26 NOTE — Interval H&P Note (Signed)
History and Physical Interval Note:  10/26/2013 8:06 AM  [name]  has presented today for surgery, with the diagnosis of AFIB  The various methods of treatment have been discussed with the patient and family. After consideration of risks, benefits and other options for treatment, the patient has consented to  Procedure(s): TRANSESOPHAGEAL ECHOCARDIOGRAM (TEE) (N/A) as a surgical intervention .  The patient's history has been reviewed, patient examined, no change in status, stable for surgery.  I have reviewed the patient's chart and labs.  Questions were answered to the patient's satisfaction.     [name] R

## 2013-10-26 NOTE — Anesthesia Procedure Notes (Signed)
Procedure Name: LMA Insertion Date/Time: 10/26/2013 10:58 AM Performed by: Octavio Graves Pre-anesthesia Checklist: Patient identified, Timeout performed, Emergency Drugs available, Suction available and Patient being monitored Patient Re-evaluated:Patient Re-evaluated prior to inductionOxygen Delivery Method: Circle system utilized Preoxygenation: Pre-oxygenation with 100% oxygen Intubation Type: IV induction Ventilation: Mask ventilation without difficulty LMA: LMA inserted LMA Size: 4.0 Tube type: Oral Number of attempts: 1 Placement Confirmation: positive ETCO2 and breath sounds checked- equal and bilateral Tube secured with: Tape Dental Injury: Teeth and Oropharynx as per pre-operative assessment  Comments: IV induction Hodierene- LMA insertion AM CRNA- atraumatic teeth and mouth as preop

## 2013-10-26 NOTE — Progress Notes (Addendum)
  Echocardiogram Transesophageal echocardiogram has been performed.  [name] 10/26/2013, 9:06 AM

## 2013-10-26 NOTE — Op Note (Signed)
SURGEON:  Thompson Grayer, MD  PREPROCEDURE DIAGNOSES: 1. Persistent atrial fibrillation. 2. Atypical appearing atrial flutter  POSTPROCEDURE DIAGNOSES: 1. Persistent atrial fibrillation. 2. Left atrial flutter  PROCEDURES: 1. Comprehensive electrophysiologic study. 2. Coronary sinus pacing and recording. 3. Three-dimensional mapping of atrial fibrillation with additional mapping and ablation of a second discrete focus (left atrial flutter) 4. Ablation of atrial fibrillation with additional mapping and ablation of a second discrete focus (left atrial flutter) 5. Intracardiac echocardiography. 6. Transseptal puncture of an intact septum. 7.  Rotational Angiography with processing at an independent workstation 8. Arrhythmia induction with pacing  INTRODUCTION:  [name] is a 73 y.o. male with a history of persistent atrial fibrillation s/p MAZE by Dr Roxy Manns who presents today for EPS and ablation of atypical appearing atrial flutter.  The patient reports initially being diagnosed with atrial fibrillation after presenting with symptomatic palpitations and fatgiue.  The patient has failed medical therapy with amiodarone.  He underwent CABG and MAZE by Dr Roxy Manns.  He has since had atypical atrial flutter for which cardioversion was unsuccessful in maintaining sinus rhythm. The patient therefore presents today for catheter ablation of atrial fibrillation and atrial flutter.  DESCRIPTION OF PROCEDURE:  Informed written consent was obtained, and the patient was brought to the electrophysiology lab in a fasting state.  The patient was adequately sedated with intravenous medications as outlined in the anesthesia report.  The patient's left and right groins were prepped and draped in the usual sterile fashion by the EP lab staff.  Using a percutaneous Seldinger technique, two 7-French and one 8-French hemostasis sheaths were placed into the right common femoral vein.  A 4- [place] hemostasis sheath was placed  in the right common femoral artery for blood pressure monitoring.  An 11-French hemostasis sheath was placed into the left common femoral vein.   3 Dimensional Rotational Angiography: A 5 french pigtail catheter was introduced through the right common femoral vein and advanced into the inferior venocava.  3 demential rotational angiography was then performed by power injection of 100cc of nonionic contrast.  Reprocessing at an independent work station was then performed.   This demonstrated a moderate sized left atrium with 4 separate pulmonary veins which were also moderate in size.  There were no anomalous veins or significant abnormalities.  A 3 dimensional rendering of the left atrium was then merged using [employer] onto the [employer] system and registered with intracardiac echo (see below).  The pigtail catheter was then removed.  Catheter Placement:  A 7-French Biosense Webster Decapolar coronary sinus catheter was introduced through the right common femoral vein and advanced into the coronary sinus for recording and pacing from this location.  A 6-French quadripolar catheter was introduced through the right common femoral vein and advanced into the right ventricle for recording and pacing.  This catheter was then pulled back to the His bundle location.    Initial Measurements: The patient presented to the electrophysiology lab in atrial flutter.  His average RR interval measured 856 msec.  The HV interval 53 msec.   The coronary sinus had very low voltage signal.  Distal to proximal activation was observed suggesting left atrial flutter.  The atrial flutter cycle length was 325 msec.  Intracardiac Echocardiography: A 10-French [employer] Webster AcuNav intracardiac echocardiography catheter was introduced through the left common femoral vein and advanced into the right atrium. Intracardiac echocardiography was performed of the left atrium, and a three-dimensional anatomical  rendering of the left atrium  was performed using CARTO sound technology.  The patient was noted to have a moderate sized left atrium.  The interatrial septum was prominent but not aneurysmal. All 4 pulmonary veins were visualized and noted to have separate ostia.  The pulmonary veins were moderate in size.  The left atrial appendage was not well visualized as he has had prior clipping of the appendage.   There was no evidence of pulmonary vein stenosis.   Transseptal Puncture: The middle right common femoral vein sheath was exchanged for an 8.5 [place] SL2 transseptal sheath and transseptal access was achieved in a standard fashion using a Brockenbrough needle under biplane fluoroscopy with intracardiac echocardiography confirmation of the transseptal puncture.  Once transseptal access had been achieved, heparin was administered intravenously and intra- arterially in order to maintain an ACT of greater than 300 seconds throughout the procedure.   3D Mapping and Ablation: A 3.5 mm Biosense Webster SmartTouch irrigated ablation catheter was advanced into the right atrium.  The transseptal sheath was pulled back into the IVC over a guidewire.  The ablation catheter was advanced across the transseptal hole using the wire as a guide.  The transseptal sheath was then re-advanced over the guidewire into the left atrium.  A duodecapolar Biosense Webster circular mapping catheter was introduced through the transseptal sheath and positioned over the mouth of all 4 pulmonary veins.  Three-dimensional electroanatomical mapping was performed using CARTO technology.  This demonstrated very low voltage across the posterior wall of the left atrium.  There was low voltage within the left inferior, right superior and right inferior pulmonary veins.  There was preserved voltage anterioseptally suggesting that this area was not isolated.  There was also some preserved voltage along the roof of the left atrium and within the  superior aspect of the left superior pulmonary vein suggesting that this area was also not isolated.    An activation map was performed during tachycardia and the majority of the tachycardia could be mapped from the left atrium confirming left atrial flutter.  The activation map suggested that the isthmus of the left atrial flutter was along the anteroseptal portion of the left atrium where preserved voltage was also found.  Extensive ablation was performed within the area.  Ablation was performed anterioseptally from the mitral valve annulus and this lesion was continued along the septum posteriorly to the area of scar.  The tachycardia slowed and then terminated during ablation.  The patient remained in sinus rhythm thereafter.  The left superior pulmonary vein was then reisolated using RF along the superior aspect of the vein.  This was a very small area.  Additonal ablation was performed along the roof of the left atrium.  The right PVs and left inferior pulmonary veins were quiescent from the prior ablation and did not require additional ablation today.  Measurements Following Ablation: In sinus rhythm with RR interval was 890, with PR 268msec, QRS 148 msec, and Qtc 524 msec.  Following ablation the AH interval measured 159msec with an HV interval of 56 msec. Ventricular pacing was performed, which revealed VA dissociation when pacing at 600 msec.  Rapid atrial pacing was performed, which revealed an AV Wenckebach cycle length of 600 msec  Pacing was continued down to a cycle length of 250 msec with no arrhythmias induced.  Electroisolation was then again confirmed in all four pulmonary veins. The procedure was therefore considered completed.  All catheters were removed, and the sheaths were aspirated and flushed.  The patient was transferred  to the recovery area for sheath removal per protocol.  A limited bedside transthoracic echocardiogram revealed no pericardial effusion. EBL<29ml.  There were no early  apparent complications.  CONCLUSIONS: 1. Left atrial flutter upon presentation.   2. Rotational Angiography reveals a moderate sized left atrium with four separate pulmonary veins without evidence of pulmonary vein stenosis. 3. Left atrial flutter successfully ablated along the anteroseptal aspect of the left atrium 4. Successful re-isolation of the left superior pulmonary vein.  The right PVs and left inferior pulmonary veins were quiescent from the prior ablation and did not require additional ablation today. 5. No inducible arrhythmias following ablation 6. No early apparent complications.   Brandilyn Nanninga,MD 1:45 PM 10/26/2013

## 2013-10-26 NOTE — Progress Notes (Signed)
Pt to Lab for EP study via strechter

## 2013-10-27 ENCOUNTER — Encounter (HOSPITAL_COMMUNITY): Payer: Self-pay | Admitting: Cardiology

## 2013-10-27 DIAGNOSIS — Z823 Family history of stroke: Secondary | ICD-10-CM | POA: Diagnosis not present

## 2013-10-27 DIAGNOSIS — I4891 Unspecified atrial fibrillation: Secondary | ICD-10-CM

## 2013-10-27 DIAGNOSIS — I5033 Acute on chronic diastolic (congestive) heart failure: Secondary | ICD-10-CM

## 2013-10-27 DIAGNOSIS — Z85828 Personal history of other malignant neoplasm of skin: Secondary | ICD-10-CM | POA: Diagnosis not present

## 2013-10-27 DIAGNOSIS — I501 Left ventricular failure: Secondary | ICD-10-CM | POA: Diagnosis present

## 2013-10-27 DIAGNOSIS — I43 Cardiomyopathy in diseases classified elsewhere: Secondary | ICD-10-CM | POA: Diagnosis present

## 2013-10-27 DIAGNOSIS — J9621 Acute and chronic respiratory failure with hypoxia: Secondary | ICD-10-CM | POA: Diagnosis not present

## 2013-10-27 DIAGNOSIS — F419 Anxiety disorder, unspecified: Secondary | ICD-10-CM | POA: Diagnosis present

## 2013-10-27 DIAGNOSIS — K219 Gastro-esophageal reflux disease without esophagitis: Secondary | ICD-10-CM | POA: Diagnosis present

## 2013-10-27 DIAGNOSIS — E876 Hypokalemia: Secondary | ICD-10-CM | POA: Diagnosis not present

## 2013-10-27 DIAGNOSIS — G4733 Obstructive sleep apnea (adult) (pediatric): Secondary | ICD-10-CM | POA: Diagnosis present

## 2013-10-27 DIAGNOSIS — D649 Anemia, unspecified: Secondary | ICD-10-CM | POA: Diagnosis present

## 2013-10-27 DIAGNOSIS — I429 Cardiomyopathy, unspecified: Secondary | ICD-10-CM

## 2013-10-27 DIAGNOSIS — I484 Atypical atrial flutter: Secondary | ICD-10-CM | POA: Diagnosis present

## 2013-10-27 DIAGNOSIS — Z951 Presence of aortocoronary bypass graft: Secondary | ICD-10-CM

## 2013-10-27 DIAGNOSIS — E1121 Type 2 diabetes mellitus with diabetic nephropathy: Secondary | ICD-10-CM | POA: Diagnosis present

## 2013-10-27 DIAGNOSIS — I481 Persistent atrial fibrillation: Secondary | ICD-10-CM | POA: Diagnosis present

## 2013-10-27 DIAGNOSIS — Z8249 Family history of ischemic heart disease and other diseases of the circulatory system: Secondary | ICD-10-CM | POA: Diagnosis not present

## 2013-10-27 DIAGNOSIS — Z800 Family history of malignant neoplasm of digestive organs: Secondary | ICD-10-CM | POA: Diagnosis not present

## 2013-10-27 DIAGNOSIS — I4892 Unspecified atrial flutter: Secondary | ICD-10-CM

## 2013-10-27 DIAGNOSIS — I110 Hypertensive heart disease with heart failure: Secondary | ICD-10-CM | POA: Diagnosis present

## 2013-10-27 DIAGNOSIS — J9610 Chronic respiratory failure, unspecified whether with hypoxia or hypercapnia: Secondary | ICD-10-CM | POA: Diagnosis present

## 2013-10-27 DIAGNOSIS — E785 Hyperlipidemia, unspecified: Secondary | ICD-10-CM | POA: Diagnosis present

## 2013-10-27 DIAGNOSIS — Z833 Family history of diabetes mellitus: Secondary | ICD-10-CM | POA: Diagnosis not present

## 2013-10-27 DIAGNOSIS — Z7901 Long term (current) use of anticoagulants: Secondary | ICD-10-CM

## 2013-10-27 DIAGNOSIS — M1990 Unspecified osteoarthritis, unspecified site: Secondary | ICD-10-CM | POA: Diagnosis present

## 2013-10-27 DIAGNOSIS — I2510 Atherosclerotic heart disease of native coronary artery without angina pectoris: Secondary | ICD-10-CM | POA: Diagnosis present

## 2013-10-27 DIAGNOSIS — F329 Major depressive disorder, single episode, unspecified: Secondary | ICD-10-CM | POA: Diagnosis present

## 2013-10-27 LAB — GLUCOSE, CAPILLARY
GLUCOSE-CAPILLARY: 186 mg/dL — AB (ref 70–99)
Glucose-Capillary: 174 mg/dL — ABNORMAL HIGH (ref 70–99)
Glucose-Capillary: 145 mg/dL — ABNORMAL HIGH (ref 70–99)
Glucose-Capillary: 148 mg/dL — ABNORMAL HIGH (ref 70–99)

## 2013-10-27 LAB — BASIC METABOLIC PANEL
Anion gap: 14 (ref 5–15)
BUN: 23 mg/dL (ref 6–23)
CO2: 29 meq/L (ref 19–32)
Calcium: 8.3 mg/dL — ABNORMAL LOW (ref 8.4–10.5)
Chloride: 101 mEq/L (ref 96–112)
Creatinine, Ser: 1.21 mg/dL (ref 0.50–1.35)
GFR calc Af Amer: 66 mL/min — ABNORMAL LOW (ref >90)
GFR calc non Af Amer: 57 mL/min — ABNORMAL LOW (ref >90)
GLUCOSE: 176 mg/dL — AB (ref 70–99)
POTASSIUM: 4 meq/L (ref 3.7–5.3)
SODIUM: 144 meq/L (ref 137–147)

## 2013-10-27 LAB — PROTIME-INR
INR: 2.34 — ABNORMAL HIGH (ref 0.00–1.49)
PROTHROMBIN TIME: 25.8 s — AB (ref 11.6–15.2)

## 2013-10-27 MED ORDER — WARFARIN - PHARMACIST DOSING INPATIENT: Freq: Every day | Status: DC

## 2013-10-27 MED ORDER — WARFARIN SODIUM 7.5 MG PO TABS
7.5000 mg | ORAL_TABLET | Freq: Once | ORAL | Status: AC
  Administered 2013-10-27: 7.5 mg via ORAL
  Filled 2013-10-27: qty 1

## 2013-10-27 MED ORDER — FUROSEMIDE 10 MG/ML IJ SOLN
INTRAMUSCULAR | Status: AC
  Filled 2013-10-27: qty 8

## 2013-10-27 MED ORDER — FUROSEMIDE 10 MG/ML IJ SOLN
80.0000 mg | Freq: Once | INTRAMUSCULAR | Status: AC
  Administered 2013-10-27: 80 mg via INTRAVENOUS
  Filled 2013-10-27: qty 8

## 2013-10-27 NOTE — Progress Notes (Signed)
T[facility] BRIEF PROGRESS NOTE  1 Day Post-Op  S/P Procedure(s) (LRB): ATRIAL FIBRILLATION ABLATION (N/A)   [name] is well known to me He is doing very well following ablation and maintaining NSR CXR performed yesterday reveals further decrease in size of left pleural effusion, which is now fairly small  Plan: Will plan repeat CXR when he comes to see me in the office in a few weeks  [name],Cory H 10/27/2013 4:29 PM

## 2013-10-27 NOTE — Transfer of Care (Addendum)
Immediate Anesthesia Transfer of Care Note  Patient: [name]  Procedure(s) Performed: Procedure(s): ATRIAL FIBRILLATION ABLATION (N/A)  Patient Location: PACU and Cath Lab  Anesthesia Type:GA  Level of Consciousness: sedated  Airway & Oxygen Therapy: Patient Spontanous Breathing and Patient connected to nasal cannula oxygen  Post-op Assessment: Report given to PACU RN and Post -op Vital signs reviewed and stable  Post vital signs: Reviewed and stable  Complications: No apparent anesthesia complications

## 2013-10-27 NOTE — Progress Notes (Addendum)
Subjective:  SOB improved a little after IV Lasix and 3L diuresis.  Remains quite dypsneic.  Denies CP.  Objective:  Vital Signs in the last 24 hours: Temp:  [97.9 F (36.6 C)-98.7 F (37.1 C)] 98.1 F (36.7 C) (10/23 0300) Pulse Rate:  [63-93] 75 (10/23 0300) Resp:  [0-40] 23 (10/23 0700) BP: (117-202)/(57-128) 182/90 mmHg (10/23 0700) SpO2:  [79 %-100 %] 100 % (10/23 0700) Weight:  [198 lb 10.2 oz (90.1 kg)] 198 lb 10.2 oz (90.1 kg) (10/22 2015)  Intake/Output from previous day:  Intake/Output Summary (Last 24 hours) at 10/27/13 0802 Last data filed at 10/27/13 0600  Gross per 24 hour  Intake    435 ml  Output   3625 ml  Net  -3190 ml    Physical Exam: Physical Exam: Filed Vitals:   10/27/13 0400 10/27/13 0600 10/27/13 0700 10/27/13 0800  BP: 146/117 160/77 182/90   Pulse:      Temp:    98.4 F (36.9 C)  TempSrc:    Oral  Resp: 19 25 23    Height:      Weight:      SpO2: 100% 91% 100%     GEN- The patient is ill appearing, alert and oriented x 3 today.   Head- normocephalic, atraumatic Eyes-  Sclera clear, conjunctiva pink Ears- hearing intact Oropharynx- clear Neck- supple,+++ JVP Lungs- tachypneic, diffuse rales, decreased BS at the bases, no wheezes Heart- Regular rate and rhythm  GI- soft, NT, ND, + BS Extremities- no clubbing, cyanosis, or edema  MS- no significant deformity or atrophy Skin- no rash or lesion Psych- euthymic mood, full affect Neuro- strength and sensation are intact     Rate: 78  Rhythm: normal sinus rhythm and 1st degree AVB  Lab Results: No results found for this basename: WBC, HGB, PLT,  in the last 72 hours  Recent Labs  10/27/13 0305  NA 144  K 4.0  CL 101  CO2 29  GLUCOSE 176*  BUN 23  CREATININE 1.21   No results found for this basename: TROPONINI, CK, MB,  in the last 72 hours  Recent Labs  10/27/13 0305  INR 2.34*    Imaging: Dg Chest 2 View  10/26/2013   CLINICAL DATA:  Shortness of breath.  Atrial fibrillation. Pleural effusion. Preprocedural for cardiac catheterization. Prior CABG.  EXAM: CHEST  2 VIEW  COMPARISON:  10/05/2013  FINDINGS: Prior CABG and atrial clip noted. Moderately enlarged cardiopericardial silhouette. Bilateral interstitial and perihilar airspace opacities favoring edema. Small to moderate left pleural effusion.  IMPRESSION: 1. Cardiomegaly with mild to moderate pulmonary edema and left pleural effusion.   Electronically Signed   By: Sherryl Barters M.D.   On: 10/26/2013 09:49    Cardiac Studies:  Assessment/Plan:  73 y.o. male with a history of persistent atrial fibrillation s/p CABG and MAZE April 2015 admitted 10/26/13 with symptomatic atrial flutter. He underwent successful RFA 10/26/13. He has had CHF post procedure.    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  Possible discharge in AM.  CM consult as pt may require home O2 at discharge.   Kerin Ransom PA-C Beeper [id number] 10/27/2013, 8:02 AM   I have seen, examined the patient, and reviewed the above assessment and plan.  Changes to above are made where necessary.  The patient is s/p atrial flutter ablation.  He has had progressive dypsnea post procedure but was very dypsneic prior to as well.  He has significant volume overload on exam despite a 3L diuresis yesterday.  I am very worried about his acute on chronic hypoxic respiratory failure and feel that his risks of intubation/ decompensation are very high.  I will keep in stepdown today.  Change status to inpatient.   Aggressive diuresis and close management.  He has acute on chronic diastolic dysfunction.  He remains on very high O2 requirement today.  He will likely require home O2 at discharge.  If he decompensates, I would return to bipap and consider pulmonary consultation though I think that presently diuresis is the most effective therapy.  Continue coumadin post ablation without interruption.  Amiodarone and metoprolol have been discontinued.  Will monitor very closely while here.  Hypertensive cardiovascular disease- BP is very elevated.  Will diurese and follow.  CAD s/p CABG is stable though I am actively following this also.  Co Sign: Thompson Grayer, MD 10/27/2013 8:18 AM

## 2013-10-27 NOTE — Progress Notes (Signed)
Patient refused Bi-Pap and has his home CPAP which he stated he would place on himself.

## 2013-10-27 NOTE — Progress Notes (Signed)
ANTICOAGULATION CONSULT NOTE - Initial Consult  Pharmacy Consult for Coumadin Indication: atrial fibrillation  Allergies  Allergen Reactions  . Sunflower Seed [Sunflower Oil] Swelling and Other (See Comments)    Tongue and lip swelling  . Horse-Derived Products Other (See Comments)    Based on skin test reaction  . Other Other (See Comments)    Tetanus Shot -- skin test reaction    Patient Measurements: Height: 5\' 11"  (180.3 cm) Weight: 198 lb 10.2 oz (90.1 kg) IBW/kg (Calculated) : 75.3 Heparin Dosing Weight: n/a  Vital Signs: Temp: 99 F (37.2 C) (10/23 1200) Temp Source: Oral (10/23 1200) BP: 117/52 mmHg (10/23 1200) Pulse Rate: 75 (10/23 0300)  Labs:  Recent Labs  10/25/13 0930 10/26/13 0725 10/27/13 0305  LABPROT 24.6* 25.0* 25.8*  INR 2.22* 2.24* 2.34*  CREATININE  --   --  1.21    Estimated Creatinine Clearance: 57 ml/min (by C-G formula based on Cr of 1.21).   Medical History: Past Medical History  Diagnosis Date  . Hx of adenomatous colonic polyps   . Diabetes mellitus type II   . Hypertension   . Diverticulosis 2001  . Hyperlipidemia   . Persistent atrial fibrillation     a. on amio and coumadin;  b. s/p MAZE 04/2013 in setting of CABG.  . Obstructive sleep apnea     compliant with CPAP  . Chronic diastolic congestive heart failure   . Hypertensive cardiomyopathy   . History of cardioversion     x3 (years uncertain)  . H/O hiatal hernia   . Cancer     Basal cell carcinoma  . Adrenal adenoma   . CAD (coronary artery disease)     a. 04/2013 CABG x 2: LIMA to LAD, SVG to RI, EVH via R thigh.  . S/P Maze operation for atrial fibrillation     a. 04/2013: Complete bilateral atrial lesion set using cryothermy and bipolar radiofrequency ablation with clipping of LA appendage (@ time of CABG)  . Type II or unspecified type diabetes mellitus without mention of complication, not stated as uncontrolled   . GERD (gastroesophageal reflux disease)   .  Depression   . Anxiety   . DJD (degenerative joint disease)   . Atypical atrial flutter 8/15, 10/15    Medications:  PTA Coumadin dose 7.5 mg daily  Assessment: 73 yo male admitted for afib ablation on chronic Coumadin.  Pharmacy asked to dose Coumadin while here.  INR therapeutic on home dose of Coumadin.  Pt notes bruise on hand, but no other bleeding or complications noted.  Goal of Therapy:  INR 2-3 Monitor platelets by anticoagulation protocol: Yes   Plan:  1. Coumadin 7.5 mg x 1 tonight. 2. Continue daily PT/INR.  Uvaldo Rising, BCPS  Clinical Pharmacist Pager [phone]  10/27/2013 1:40 PM

## 2013-10-27 NOTE — Discharge Instructions (Signed)

## 2013-10-27 NOTE — Care Management Note (Addendum)
  Page 2 of 2   10/30/2013     5:14:20 PM CARE MANAGEMENT NOTE 10/30/2013  Patient:  [name], [name]   Account Number:  [id number]  Date Initiated:  10/27/2013  Documentation initiated by:  Elissa Hefty  Subjective/Objective Assessment:   adm w at flutter, fld overload     Action/Plan:   lives w wife, pcp dr [name]   Anticipated DC Date:  10/30/2013   Anticipated DC Plan:  [place]  CM consult      PAC Choice  DURABLE MEDICAL EQUIPMENT   Choice offered to / List presented to:     DME arranged  [facility]      DME agency  [facility]        Status of service:  Completed, signed off Medicare Important Message given?  YES (If response is "NO", the following Medicare IM given date fields will be blank) Date Medicare IM given:  10/30/2013 Medicare IM given by:  [facility] Date Additional Medicare IM given:   Additional Medicare IM given by:    Discharge Disposition:  HOME/SELF CARE  Per UR Regulation:  Reviewed for med. necessity/level of care/duration of stay  If discussed at [place] of Stay Meetings, dates discussed:   10/31/2013    Comments:  Mariann Laster RN, BSN, MSHL, CCM  Nurse - Case Manager,  (Unit [facility])  [phone]  10/30/2013 per nursing:  O2 sats checked and does not qualify for any home oxygen orders.  O2 order cancelled with [facility]/Stephanie.   10/29/13 Fuller Mandril, RN, BSN, NCM [phone] Pt to d/c home today.  10/29/13 08:30 Cm called DME delivery rep, [name] to please deliver O2 to room prior to discharge 09:00 this am.  No other CM needs were communicated.  Mariane Masters, BSN, [place].  10/23 0825 debbie dowell rn,bsn saw order that will prob need home o2. [facility] gets order and will ck w them to be sure ready for dc on 10-24

## 2013-10-28 ENCOUNTER — Inpatient Hospital Stay (HOSPITAL_COMMUNITY): Payer: Medicare Other

## 2013-10-28 DIAGNOSIS — J948 Other specified pleural conditions: Secondary | ICD-10-CM

## 2013-10-28 LAB — GLUCOSE, CAPILLARY
GLUCOSE-CAPILLARY: 181 mg/dL — AB (ref 70–99)
GLUCOSE-CAPILLARY: 155 mg/dL — AB (ref 70–99)
Glucose-Capillary: 112 mg/dL — ABNORMAL HIGH (ref 70–99)
Glucose-Capillary: 160 mg/dL — ABNORMAL HIGH (ref 70–99)

## 2013-10-28 LAB — BASIC METABOLIC PANEL
Anion gap: 11 (ref 5–15)
BUN: 26 mg/dL — ABNORMAL HIGH (ref 6–23)
CO2: 29 mEq/L (ref 19–32)
Calcium: 8.6 mg/dL (ref 8.4–10.5)
Chloride: 103 mEq/L (ref 96–112)
Creatinine, Ser: 1.09 mg/dL (ref 0.50–1.35)
GFR calc Af Amer: 75 mL/min — ABNORMAL LOW (ref >90)
GFR calc non Af Amer: 65 mL/min — ABNORMAL LOW (ref >90)
Glucose, Bld: 118 mg/dL — ABNORMAL HIGH (ref 70–99)
Potassium: 4 mEq/L (ref 3.7–5.3)
Sodium: 143 mEq/L (ref 137–147)

## 2013-10-28 LAB — PROTIME-INR
INR: 3 — AB (ref 0.00–1.49)
PROTHROMBIN TIME: 31.4 s — AB (ref 11.6–15.2)

## 2013-10-28 MED ORDER — PANTOPRAZOLE SODIUM 40 MG PO TBEC
40.0000 mg | DELAYED_RELEASE_TABLET | Freq: Every day | ORAL | Status: DC
  Administered 2013-10-28 – 2013-10-30 (×3): 40 mg via ORAL
  Filled 2013-10-28 (×2): qty 1

## 2013-10-28 MED ORDER — WARFARIN SODIUM 2.5 MG PO TABS
2.5000 mg | ORAL_TABLET | Freq: Once | ORAL | Status: AC
  Administered 2013-10-28: 2.5 mg via ORAL
  Filled 2013-10-28: qty 1

## 2013-10-28 MED ORDER — CETYLPYRIDINIUM CHLORIDE 0.05 % MT LIQD
7.0000 mL | Freq: Two times a day (BID) | OROMUCOSAL | Status: DC
  Administered 2013-10-28 – 2013-10-29 (×3): 7 mL via OROMUCOSAL

## 2013-10-28 MED ORDER — CHLORHEXIDINE GLUCONATE 0.12 % MT SOLN
15.0000 mL | Freq: Two times a day (BID) | OROMUCOSAL | Status: DC
  Administered 2013-10-28 – 2013-10-30 (×4): 15 mL via OROMUCOSAL
  Filled 2013-10-28 (×7): qty 15

## 2013-10-28 MED ORDER — FUROSEMIDE 10 MG/ML IJ SOLN
80.0000 mg | Freq: Once | INTRAMUSCULAR | Status: AC
  Administered 2013-10-28: 80 mg via INTRAVENOUS

## 2013-10-28 MED ORDER — FUROSEMIDE 10 MG/ML IJ SOLN
INTRAMUSCULAR | Status: AC
  Filled 2013-10-28: qty 8

## 2013-10-28 NOTE — Progress Notes (Signed)
Patient desats into high 80's. Ran O2 bleed through home CPAP rather than Nasal Cannula.  Patient SPO2 recovered to 95-97%.  RT will continue to monitor.

## 2013-10-28 NOTE — Progress Notes (Signed)
Pt ambulate around entire unit on 3L/Golden. Initially SpO2 was 93, SpO2 dropped to 79% during ambulation with the 3Liters. Pt reported a slight increase in SOB, but was not visibly SOB and was able to carry on a conversation. Pt recovered well. BP after walking was 151/121 and SpO2 came up to 94% after about 3 minutes of resting. Comminicated via phone with Mariane Masters in Case Management regarding [name] requirement of O2 and the request that Dr. Rayann Heman would like to see the pt go home tomorrow.

## 2013-10-28 NOTE — Progress Notes (Signed)
ANTICOAGULATION CONSULT NOTE - Initial Consult  Pharmacy Consult for Coumadin Indication: atrial fibrillation  Allergies  Allergen Reactions  . Sunflower Seed [Sunflower Oil] Swelling and Other (See Comments)    Tongue and lip swelling  . Horse-Derived Products Other (See Comments)    Based on skin test reaction  . Other Other (See Comments)    Tetanus Shot -- skin test reaction    Patient Measurements: Height: 5\' 11"  (180.3 cm) Weight: 198 lb 10.2 oz (90.1 kg) IBW/kg (Calculated) : 75.3 Heparin Dosing Weight: n/a  Vital Signs: Temp: 99.2 F (37.3 C) (10/24 0800) Temp Source: Oral (10/24 0800) BP: 161/85 mmHg (10/24 0800) Pulse Rate: 78 (10/24 0300)  Labs:  Recent Labs  10/26/13 0725 10/27/13 0305 10/28/13 0219  LABPROT 25.0* 25.8* 31.4*  INR 2.24* 2.34* 3.00*  CREATININE  --  1.21 1.09    Estimated Creatinine Clearance: 63.3 ml/min (by C-G formula based on Cr of 1.09).   Medical History: Past Medical History  Diagnosis Date  . Hx of adenomatous colonic polyps   . Diabetes mellitus type II   . Hypertension   . Diverticulosis 2001  . Hyperlipidemia   . Persistent atrial fibrillation     a. on amio and coumadin;  b. s/p MAZE 04/2013 in setting of CABG.  . Obstructive sleep apnea     compliant with CPAP  . Chronic diastolic congestive heart failure   . Hypertensive cardiomyopathy   . History of cardioversion     x3 (years uncertain)  . H/O hiatal hernia   . Cancer     Basal cell carcinoma  . Adrenal adenoma   . CAD (coronary artery disease)     a. 04/2013 CABG x 2: LIMA to LAD, SVG to RI, EVH via R thigh.  . S/P Maze operation for atrial fibrillation     a. 04/2013: Complete bilateral atrial lesion set using cryothermy and bipolar radiofrequency ablation with clipping of LA appendage (@ time of CABG)  . Type II or unspecified type diabetes mellitus without mention of complication, not stated as uncontrolled   . GERD (gastroesophageal reflux disease)   .  Depression   . Anxiety   . DJD (degenerative joint disease)   . Atypical atrial flutter 8/15, 10/15    Medications:  PTA Coumadin dose 7.5 mg daily  Assessment: 73 yo male admitted for afib ablation on chronic Coumadin.  Pharmacy asked to dose Coumadin while here.  INR therapeutic on admit.  Pt notes bruise on hand, but no other bleeding or complications noted. INR today has risen sharply to 3.00 on home dose.   Goal of Therapy:  INR 2-3 Monitor platelets by anticoagulation protocol: Yes   Plan:  1. Coumadin 2.5 mg x 1 tonight 2. Continue daily PT/INR  Harolyn Rutherford, PharmD Clinical Pharmacist - Resident Pager: [phone] Pharmacy: [phone] 10/28/2013 10:00 AM

## 2013-10-28 NOTE — Progress Notes (Signed)
SUBJECTIVE: The patient is doing well today.  States shortness of breath is slow to improve.  No chest pain.   He remains markedly volume overloaded.  I/O -4.3L since admission.   CURRENT MEDICATIONS: . antiseptic oral rinse  7 mL Mouth Rinse q12n4p  . chlorhexidine  15 mL Mouth Rinse BID  . cloNIDine  0.2 mg Oral BID  . furosemide  40 mg Oral q1800  . glimepiride  2 mg Oral Q breakfast  . potassium chloride SA  20 mEq Oral TID  . sertraline  100 mg Oral Daily  . sodium chloride  3 mL Intravenous Q12H  . Warfarin - Pharmacist Dosing Inpatient   Does not apply q1800      OBJECTIVE: Physical Exam: Filed Vitals:   10/28/13 0300 10/28/13 0400 10/28/13 0500 10/28/13 0800  BP: 114/69 120/52 115/52 161/85  Pulse: 78     Temp: 97.6 F (36.4 C)   99.2 F (37.3 C)  TempSrc: Oral   Oral  Resp: 16   16  Height:      Weight:      SpO2: 98% 93% 93% 97%    Intake/Output Summary (Last 24 hours) at 10/28/13 0848 Last data filed at 10/28/13 0400  Gross per 24 hour  Intake   1040 ml  Output   2225 ml  Net  -1185 ml    Telemetry reveals sinus rhythm  GEN- The patient is ill appearing, tachypneic alert and oriented x 3 today.   Head- normocephalic, atraumatic Eyes-  Sclera clear, conjunctiva pink Ears- hearing intact Oropharynx- clear Neck- supple, markedly elevated Lungs- bibasilar rales, decreased BS at the bases, increased work of breathing Heart- Regular rate and rhythm  GI- soft, NT, ND, + BS Extremities- no clubbing, cyanosis, or edema Skin- no rash or lesion Psych- euthymic mood, full affect Neuro- strength and sensation are intact  LABS: Basic Metabolic Panel:  Recent Labs  10/27/13 0305 10/28/13 0219  NA 144 143  K 4.0 4.0  CL 101 103  CO2 29 29  GLUCOSE 176* 118*  BUN 23 26*  CREATININE 1.21 1.09  CALCIUM 8.3* 8.6    RADIOLOGY: Dg Chest 2 View 10/26/2013   CLINICAL DATA:  Shortness of breath. Atrial fibrillation. Pleural effusion. Preprocedural  for cardiac catheterization. Prior CABG.  EXAM: CHEST  2 VIEW  COMPARISON:  10/05/2013  FINDINGS: Prior CABG and atrial clip noted. Moderately enlarged cardiopericardial silhouette. Bilateral interstitial and perihilar airspace opacities favoring edema. Small to moderate left pleural effusion.  IMPRESSION: 1. Cardiomegaly with mild to moderate pulmonary edema and left pleural effusion.   Electronically Signed   By: Sherryl Barters M.D.   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  Atypical atrial flutter Maintaining SR post ablation INR 3.0 today.   2.  Acute on chronic diastolic heart failure Continue IV diuresis today due to marked volume overload Daily weights Strict Is and Os repeat CXR I am very worried about his acute on chronic hypoxic respiratory  failure and feel that his risks of intubation/ decompensation are very high. I will keep in stepdown another day.  3.  CAD s/p CABG No ischemic symptoms  4. Hypertensive cardiovascular disease with CHF Restart valsartan if creatinine remains stable tomorrow Treat with lasix for now

## 2013-10-28 NOTE — Progress Notes (Signed)
Patient walked around unit x2 on 4L [place]. Patient tolerated well. No SOB. Sats maintained around 91%. Lowest noted O2 sat was 87%.

## 2013-10-29 DIAGNOSIS — I2510 Atherosclerotic heart disease of native coronary artery without angina pectoris: Secondary | ICD-10-CM | POA: Diagnosis present

## 2013-10-29 LAB — PROTIME-INR
INR: 2.66 — AB (ref 0.00–1.49)
Prothrombin Time: 28.6 seconds — ABNORMAL HIGH (ref 11.6–15.2)

## 2013-10-29 LAB — CBC
HCT: 30.2 % — ABNORMAL LOW (ref 39.0–52.0)
Hemoglobin: 10 g/dL — ABNORMAL LOW (ref 13.0–17.0)
MCH: 30.9 pg (ref 26.0–34.0)
MCHC: 33.1 g/dL (ref 30.0–36.0)
MCV: 93.2 fL (ref 78.0–100.0)
PLATELETS: 175 10*3/uL (ref 150–400)
RBC: 3.24 MIL/uL — ABNORMAL LOW (ref 4.22–5.81)
RDW: 14.4 % (ref 11.5–15.5)
WBC: 9.8 10*3/uL (ref 4.0–10.5)

## 2013-10-29 LAB — BASIC METABOLIC PANEL
Anion gap: 14 (ref 5–15)
BUN: 30 mg/dL — ABNORMAL HIGH (ref 6–23)
CALCIUM: 8.6 mg/dL (ref 8.4–10.5)
CO2: 28 mEq/L (ref 19–32)
Chloride: 96 mEq/L (ref 96–112)
Creatinine, Ser: 1.13 mg/dL (ref 0.50–1.35)
GFR calc Af Amer: 72 mL/min — ABNORMAL LOW (ref >90)
GFR, EST NON AFRICAN AMERICAN: 62 mL/min — AB (ref >90)
Glucose, Bld: 173 mg/dL — ABNORMAL HIGH (ref 70–99)
Potassium: 3.4 mEq/L — ABNORMAL LOW (ref 3.7–5.3)
SODIUM: 138 meq/L (ref 137–147)

## 2013-10-29 LAB — GLUCOSE, CAPILLARY
GLUCOSE-CAPILLARY: 138 mg/dL — AB (ref 70–99)
GLUCOSE-CAPILLARY: 140 mg/dL — AB (ref 70–99)
Glucose-Capillary: 186 mg/dL — ABNORMAL HIGH (ref 70–99)
Glucose-Capillary: 76 mg/dL (ref 70–99)

## 2013-10-29 MED ORDER — WARFARIN SODIUM 7.5 MG PO TABS
7.5000 mg | ORAL_TABLET | Freq: Once | ORAL | Status: AC
  Administered 2013-10-29: 7.5 mg via ORAL
  Filled 2013-10-29: qty 1

## 2013-10-29 MED ORDER — FUROSEMIDE 80 MG PO TABS: 80.0000 mg | ORAL_TABLET | Freq: Every day | ORAL | Status: DC

## 2013-10-29 MED ORDER — INSULIN ASPART 100 UNIT/ML ~~LOC~~ SOLN
0.0000 [IU] | Freq: Three times a day (TID) | SUBCUTANEOUS | Status: DC
Start: 2013-10-29 — End: 2013-10-30
  Administered 2013-10-29: 2 [IU] via SUBCUTANEOUS
  Administered 2013-10-29: 3 [IU] via SUBCUTANEOUS
  Administered 2013-10-29: 2 [IU] via SUBCUTANEOUS
  Administered 2013-10-30: 3 [IU] via SUBCUTANEOUS
  Administered 2013-10-30: 2 [IU] via SUBCUTANEOUS

## 2013-10-29 MED ORDER — FUROSEMIDE 40 MG PO TABS: 40.0000 mg | ORAL_TABLET | Freq: Every day | ORAL | Status: DC

## 2013-10-29 MED ORDER — FUROSEMIDE 10 MG/ML IJ SOLN: 40.0000 mg | Freq: Once | INTRAMUSCULAR | Status: DC

## 2013-10-29 MED ORDER — POTASSIUM CHLORIDE CRYS ER 20 MEQ PO TBCR
40.0000 meq | EXTENDED_RELEASE_TABLET | Freq: Three times a day (TID) | ORAL | Status: DC
  Administered 2013-10-29 – 2013-10-30 (×4): 40 meq via ORAL
  Filled 2013-10-29 (×6): qty 2

## 2013-10-29 MED ORDER — FUROSEMIDE 10 MG/ML IJ SOLN
80.0000 mg | Freq: Two times a day (BID) | INTRAMUSCULAR | Status: DC
  Administered 2013-10-29 – 2013-10-30 (×3): 80 mg via INTRAVENOUS
  Filled 2013-10-29 (×5): qty 8

## 2013-10-29 MED ORDER — ATORVASTATIN CALCIUM 20 MG PO TABS
20.0000 mg | ORAL_TABLET | Freq: Every day | ORAL | Status: DC
  Administered 2013-10-29: 20 mg via ORAL
  Filled 2013-10-29 (×3): qty 1

## 2013-10-29 NOTE — Progress Notes (Signed)
Patient transferred to 3E19 from [facility]  Patient alert and oriented x4, vital signs stable.  Patient voices no questions or concerns at this time.  Will continue to monitor.

## 2013-10-29 NOTE — Progress Notes (Signed)
ANTICOAGULATION CONSULT NOTE - Initial Consult  Pharmacy Consult for Coumadin Indication: atrial fibrillation  Allergies  Allergen Reactions  . Sunflower Seed [Sunflower Oil] Swelling and Other (See Comments)    Tongue and lip swelling  . Horse-Derived Products Other (See Comments)    Based on skin test reaction  . Other Other (See Comments)    Tetanus Shot -- skin test reaction    Patient Measurements: Height: 5\' 11"  (180.3 cm) Weight: 189 lb 2.5 oz (85.8 kg) (scale c) IBW/kg (Calculated) : 75.3 Heparin Dosing Weight: n/a  Vital Signs: Temp: 98.6 F (37 C) (10/25 1049) Temp Source: Oral (10/25 1049) BP: 127/68 mmHg (10/25 1049) Pulse Rate: 65 (10/25 1049)  Labs:  Recent Labs  10/27/13 0305 10/28/13 0219 10/29/13 0238  HGB  --   --  10.0*  HCT  --   --  30.2*  PLT  --   --  175  LABPROT 25.8* 31.4* 28.6*  INR 2.34* 3.00* 2.66*  CREATININE 1.21 1.09 1.13    Estimated Creatinine Clearance: 61.1 ml/min (by C-G formula based on Cr of 1.13).   Medical History: Past Medical History  Diagnosis Date  . Hx of adenomatous colonic polyps   . Diabetes mellitus type II   . Hypertension   . Diverticulosis 2001  . Hyperlipidemia   . Persistent atrial fibrillation     a. on amio and coumadin;  b. s/p MAZE 04/2013 in setting of CABG.  . Obstructive sleep apnea     compliant with CPAP  . Chronic diastolic congestive heart failure   . Hypertensive cardiomyopathy   . History of cardioversion     x3 (years uncertain)  . H/O hiatal hernia   . Cancer     Basal cell carcinoma  . Adrenal adenoma   . CAD (coronary artery disease)     a. 04/2013 CABG x 2: LIMA to LAD, SVG to RI, EVH via R thigh.  . S/P Maze operation for atrial fibrillation     a. 04/2013: Complete bilateral atrial lesion set using cryothermy and bipolar radiofrequency ablation with clipping of LA appendage (@ time of CABG)  . Type II or unspecified type diabetes mellitus without mention of complication, not  stated as uncontrolled   . GERD (gastroesophageal reflux disease)   . Depression   . Anxiety   . DJD (degenerative joint disease)   . Atypical atrial flutter 8/15, 10/15    Medications:  PTA Coumadin dose 7.5 mg daily  Assessment: 73 yo male admitted for afib ablation on chronic Coumadin.  Pharmacy asked to dose Coumadin while here.  INR therapeutic on admit.  Pt notes bruise on hand, but no other bleeding or complications noted. INR back down to 2.66 today.    Goal of Therapy:  INR 2-3 Monitor platelets by anticoagulation protocol: Yes   Plan:   Coumadin 7.5mg  PO x1 Continue daily PT/INR  Onnie Boer, PharmD Pager: [phone] 10/29/2013 12:32 PM

## 2013-10-29 NOTE — Progress Notes (Signed)
Patient Name: [name] Date of Encounter: 10/29/2013    Principal Problem:   Atrial flutter with rapid ventricular response- s/p RFA 10/26/13 Active Problems:   Hypertensive cardiomyopathy- LVH, Nl LVF April 2015   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   Acute on chronic diastolic CHF (congestive heart failure), NYHA class 3   Acute on chronic respiratory failure   CAD (coronary artery disease)   Type 2 diabetes with nephropathy   Pleural effusion on left   Hyperlipidemia   PAF (paroxysmal atrial fibrillation)   Chronic anticoagulation    SUBJECTIVE  Ambulated yesterday x 2 and dropped sats - 79% on 3lpm yesterday afternoon and 91% on 4lpm late last night.  -2.4L net negative diuresis yesterday (-6.8 overall).  Wt is down 7 lbs since admission.  Previously as low as 186 lbs in May, currently 192.  Maintaining sinus rhythm.  HR/BP, renal fxn stable.  No chest pain or dyspnea at rest this AM.  CURRENT MEDS . antiseptic oral rinse  7 mL Mouth Rinse q12n4p  . chlorhexidine  15 mL Mouth Rinse BID  . cloNIDine  0.2 mg Oral BID  . glimepiride  2 mg Oral Q breakfast  . pantoprazole  40 mg Oral Q0600  . potassium chloride SA  20 mEq Oral TID  . sertraline  100 mg Oral Daily  . sodium chloride  3 mL Intravenous Q12H  . Warfarin - Pharmacist Dosing Inpatient   Does not apply q1800    OBJECTIVE  Filed Vitals:   10/29/13 0100 10/29/13 0200 10/29/13 0300 10/29/13 0330  BP:  122/66  99/43  Pulse:    61  Temp:    97.9 F (36.6 C)  TempSrc:    Oral  Resp:    16  Height:      Weight:    191 lb 8 oz (86.864 kg)  SpO2: 89% 94% 88% 96%    Intake/Output Summary (Last 24 hours) at 10/29/13 0717 Last data filed at 10/29/13 0500  Gross per 24 hour  Intake    360 ml  Output   2830 ml  Net  -2470 ml   Filed Weights   10/26/13 2015 10/29/13 0330  Weight: 198 lb 10.2 oz (90.1 kg) 191 lb 8 oz (86.864 kg)    PHYSICAL EXAM  General: Pleasant, NAD. Neuro: Alert and  oriented X 3. Moves all extremities spontaneously. Psych: Normal affect. HEENT:  Normal  Neck: Supple without bruits.  Mod elevated JVP ~ 12 cm. Lungs:  Resp regular and unlabored, Diminished 1/2 up on left.  Fine R basilar crackles. Heart: RRR no s3, s4, or murmurs. Abdomen: Semi-firm, non-tender,BS + x 4.  Extremities: No clubbing, cyanosis or edema. DP/PT/Radials 2+ and equal bilaterally.  R groin w/o bleeding/bruit/hematoma.  Accessory Clinical Findings  CBC  Recent Labs  10/29/13 0238  WBC 9.8  HGB 10.0*  HCT 30.2*  MCV 93.2  PLT 0000000   Basic Metabolic Panel  Recent Labs  10/28/13 0219 10/29/13 0238  NA 143 138  K 4.0 3.4*  CL 103 96  CO2 29 28  GLUCOSE 118* 173*  BUN 26* 30*  CREATININE 1.09 1.13  CALCIUM 8.6 8.6   Lab Results  Component Value Date   INR 2.66* 10/29/2013   INR 3.00* 10/28/2013   INR 2.34* 10/27/2013    TELE  Rsr, 1st deg avb.  Radiology/Studies  Dg Chest Port 1 View  10/28/2013   CLINICAL DATA:  Shortness of  breath, personal history of diabetes mellitus type 2, hypertension, hypertensive cardiomyopathy, coronary artery disease  EXAM: PORTABLE CHEST - 1 VIEW  COMPARISON:  Portable exam 1532 hr compared to 10/26/2013  FINDINGS: Enlargement of cardiac silhouette post CABG.  Mediastinal contours normal.  Pulmonary vascular congestion.  Perihilar infiltrates compatible with pulmonary edema.  Increased LEFT pleural effusion and basilar atelectasis.  Minimal RIGHT base atelectasis.  No pneumothorax.  Bones demineralized.  IMPRESSION: CHF with increased LEFT pleural effusion and basilar atelectasis.   Electronically Signed   By: Lavonia Dana M.D.   On: 10/28/2013 17:40    ASSESSMENT AND PLAN  1.  Atypical Atrial Flutter/PAF:  S/p TEE/EPS and RFCA for aflutter on 10/22.  Maintaining sinus rhythm.   Off of amio and bb.  INR rx.  2.  Acute on chronic diastolic CHF/Hypertensive cardiomyopathy:  As above, wt down 7lbs since admission.  Net negative  6.8L overall.  Ambulated twice yesterday and on both occasions he requires supplemental O2 and still desaturated to the low 90's on 4lpm.  He was not previously on home O2.  He has markedly diminished breath sounds on the left with known prior h/o L pleural effusion s/p thoracentesis in 05/2013.  CXR yesterday showed increased L pleural effusion and CHF.  Still with R basilar crackles and JVD.  Wt is 191 today with prior dry wt of 186 in May.  Renal fxn stable., though BUN now beginning to bump.  Bicarb stable.  I think he'd benefit from another dose of IV lasix with subsequent resumption of his prior home dose of 80mg  in the AM and 40mg  in the PM.  Hopefully his volume will remain more stable now that he is in sinus rhythm.  BP stable on clonidine.  3.  Acute Respiratory Failure:  See #2.  Was not prev on home O2.  Diurese more today.  Ambulate.  Hopefully wean O2.  4.  Chronic L pleural effusion:  Worse yesterday in setting of volume overload.  S/P thoracentesis in May.  Dr. Roxy Manns plans to f/u as outpt.  If effusion and hypoxia persist despite adequate diuresis, we will need to consider repeat thoracentesis.  5.  HTN:  Stable on clonidine only.  Was also on high dose minoxidil (20mg ) and diovan @ home previously.  Will have to follow closely as BP has been labile here - trending in 160's - 170's during the day yesterday and currently in the high 90's to low 100's.  6.  DM:  A1c 6.1 in Sept.  Cont amaryl.  Also on metformin @ home.  7.  HL:  On atorvastatin 20 @ home.  Resume.  8.  Hypokalemia:  Supp.  Signed, Murray Hodgkins NP   I have seen, examined the patient, and reviewed the above assessment and plan.  Changes to above are made where necessary.  The patient continues to have a very tenuous respiratory state.  He is at high risk for decompensation.  His pleural effusion is worse.  He is quite ill.  He is willing to remain in the hospital for additional diuresis/ medical management of his  complex medical issues.  Co Sign: Thompson Grayer, MD 10/29/2013 7:58 AM

## 2013-10-30 ENCOUNTER — Ambulatory Visit: Payer: Self-pay | Admitting: Internal Medicine

## 2013-10-30 ENCOUNTER — Encounter: Payer: Self-pay | Admitting: Internal Medicine

## 2013-10-30 ENCOUNTER — Encounter (HOSPITAL_COMMUNITY): Payer: Self-pay | Admitting: Physician Assistant

## 2013-10-30 ENCOUNTER — Telehealth: Payer: Self-pay | Admitting: Internal Medicine

## 2013-10-30 ENCOUNTER — Inpatient Hospital Stay (HOSPITAL_COMMUNITY): Payer: Medicare Other

## 2013-10-30 DIAGNOSIS — Q211 Atrial septal defect: Secondary | ICD-10-CM

## 2013-10-30 DIAGNOSIS — Q2112 Patent foramen ovale: Secondary | ICD-10-CM

## 2013-10-30 LAB — GLUCOSE, CAPILLARY
GLUCOSE-CAPILLARY: 188 mg/dL — AB (ref 70–99)
Glucose-Capillary: 129 mg/dL — ABNORMAL HIGH (ref 70–99)

## 2013-10-30 LAB — BASIC METABOLIC PANEL
Anion gap: 11 (ref 5–15)
BUN: 27 mg/dL — AB (ref 6–23)
CALCIUM: 8.9 mg/dL (ref 8.4–10.5)
CO2: 31 mEq/L (ref 19–32)
Chloride: 100 mEq/L (ref 96–112)
Creatinine, Ser: 0.99 mg/dL (ref 0.50–1.35)
GFR calc Af Amer: >90 mL/min (ref >90)
GFR, EST NON AFRICAN AMERICAN: 79 mL/min — AB (ref >90)
Glucose, Bld: 127 mg/dL — ABNORMAL HIGH (ref 70–99)
Potassium: 3.8 mEq/L (ref 3.7–5.3)
Sodium: 142 mEq/L (ref 137–147)

## 2013-10-30 LAB — PROTIME-INR
INR: 2.32 — ABNORMAL HIGH (ref 0.00–1.49)
Prothrombin Time: 25.7 seconds — ABNORMAL HIGH (ref 11.6–15.2)

## 2013-10-30 MED ORDER — WARFARIN SODIUM 7.5 MG PO TABS
7.5000 mg | ORAL_TABLET | Freq: Once | ORAL | Status: DC
  Filled 2013-10-30: qty 1

## 2013-10-30 MED ORDER — PANTOPRAZOLE SODIUM 40 MG PO TBEC: 40.0000 mg | DELAYED_RELEASE_TABLET | Freq: Every day | ORAL | Status: DC

## 2013-10-30 MED ORDER — FUROSEMIDE 40 MG PO TABS: 80.0000 mg | ORAL_TABLET | Freq: Two times a day (BID) | ORAL | Status: DC

## 2013-10-30 MED ORDER — POTASSIUM CHLORIDE CRYS ER 20 MEQ PO TBCR: 40.0000 meq | EXTENDED_RELEASE_TABLET | Freq: Two times a day (BID) | ORAL | Status: DC

## 2013-10-30 NOTE — Progress Notes (Addendum)
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 antiseptic oral rinse  7 mL Mouth Rinse q12n4p  . atorvastatin  20 mg Oral q1800  . chlorhexidine  15 mL Mouth Rinse BID  . cloNIDine  0.2 mg Oral BID  . furosemide  80 mg Intravenous BID  . glimepiride  2 mg Oral Q breakfast  . insulin aspart  0-15 Units Subcutaneous TID WC  . pantoprazole  40 mg Oral Q0600  . potassium chloride SA  40 mEq Oral TID  . sertraline  100 mg Oral Daily  . sodium chloride  3 mL Intravenous Q12H  . Warfarin - Pharmacist Dosing Inpatient   Does not apply q1800    OBJECTIVE  Filed Vitals:   10/29/13 1736 10/29/13 2159 10/30/13 0100 10/30/13 0651  BP: 151/74 157/91 164/88 154/80  Pulse: 74 73 63 66  Temp: 98.1 F (36.7 C) 99.1 F (37.3 C) 98.1 F (36.7 C) 97.2 F (36.2 C)  TempSrc: Oral Oral Oral Oral  Resp: 18 18 18 17   Height:      Weight:    188 lb 0.8 oz (85.3 kg)  SpO2: 99% 98% 98% 99%    Intake/Output Summary (Last 24 hours) at 10/30/13 0831 Last data filed at 10/30/13 0740  Gross per 24 hour  Intake   1113 ml  Output   3145 ml  Net  -2032 ml   Filed Weights   10/29/13 0330 10/29/13 1049 10/30/13 0651  Weight: 191 lb 8 oz (86.864 kg) 189 lb 2.5 oz (85.8 kg) 188 lb 0.8 oz (85.3 kg)    PHYSICAL EXAM  General: Pleasant, NAD. Neuro: Alert and oriented X 3. Moves all extremities  spontaneously. Psych: Normal affect. HEENT:  Normal  Neck: Supple without bruits.  Mod elevated JVP ~ 10cm. Lungs:  Resp regular and unlabored, Diminished 1/4 up on left.  Fine R basilar crackles. Heart: RRR no s3, s4, or murmurs. Abdomen: Semi-firm, non-tender,BS + x 4.  Extremities: No clubbing, cyanosis or edema. DP/PT/Radials 2+ and equal bilaterally.  R groin w/o bleeding/bruit/hematoma.  Accessory Clinical Findings  CBC  Recent Labs  10/29/13 0238  WBC 9.8  HGB 10.0*  HCT 30.2*  MCV 93.2  PLT 0000000   Basic Metabolic Panel  Recent Labs  10/29/13 0238 10/30/13 0530  NA 138 142  K 3.4* 3.8  CL 96 100  CO2 28 31  GLUCOSE 173* 127*  BUN 30* 27*  CREATININE 1.13 0.99  CALCIUM 8.6 8.9   Lab Results  Component Value Date   INR 2.32* 10/30/2013   INR 2.66* 10/29/2013   INR 3.00* 10/28/2013    TELE  Rsr, 1st deg avb.  Radiology/Studies  Dg Chest Port 1 View  10/28/2013   CLINICAL DATA:  Shortness of breath, personal history of diabetes mellitus type 2, hypertension, hypertensive cardiomyopathy, coronary artery disease  EXAM: PORTABLE  CHEST - 1 VIEW  COMPARISON:  Portable exam 1532 hr compared to 10/26/2013  FINDINGS: Enlargement of cardiac silhouette post CABG.  Mediastinal contours normal.  Pulmonary vascular congestion.  Perihilar infiltrates compatible with pulmonary edema.  Increased LEFT pleural effusion and basilar atelectasis.  Minimal RIGHT base atelectasis.  No pneumothorax.  Bones demineralized.  IMPRESSION: CHF with increased LEFT pleural effusion and basilar atelectasis.   Electronically Signed   By: Lavonia Dana M.D.   On: 10/28/2013 17:40    ASSESSMENT AND PLAN  1.  Atypical Atrial Flutter/PAF:  S/p TEE/EPS and RFCA for aflutter on 10/22.  Maintaining sinus rhythm.   Off of amio and bb.  INR rx.  2.  Acute on chronic diastolic CHF/Hypertensive cardiomyopathy:  As above, wt down 10lbs since admission.   JVP is still up.  I suspect he has 5 more lbs of  fluid.  Will give IV lasix this am.  Will discharge on lasix 80mg  PO BID.  Will need transition of care visit.  3.  Acute Respiratory Failure:  See #2.  Was not prev on home O2.  Diurese more today.  Ambulate.  Hopefully wean O2.  4.  Chronic L pleural effusion:  Worse yesterday in setting of volume overload.  S/P thoracentesis in May.  Dr. Roxy Manns plans to f/u as outpt.  If effusion and hypoxia persist despite adequate diuresis, we will need to consider repeat thoracentesis.  5.  HTN:  Stable on clonidine only.  Stop minoxidil long term  6.  DM:  A1c 6.1 in Sept.  Cont amaryl.  Also on metformin @ home.  OK to resume.  7.  HL:  On atorvastatin 20 @ home.  Resume.  8.  Hypokalemia:  Supp.  Will need transition of care visit Follow-up with me in 4 weeks  Thompson Grayer MD 10/30/2013 8:31 AM

## 2013-10-30 NOTE — Progress Notes (Signed)
ANTICOAGULATION CONSULT NOTE - Follow Up Consult  Pharmacy Consult:  Coumadin Indication:  Afib  Allergies  Allergen Reactions  . Sunflower Seed [Sunflower Oil] Swelling and Other (See Comments)    Tongue and lip swelling  . Horse-Derived Products Other (See Comments)    Based on skin test reaction  . Other Other (See Comments)    Tetanus Shot -- skin test reaction    Patient Measurements: Height: 5\' 11"  (180.3 cm) Weight: 188 lb 0.8 oz (85.3 kg) IBW/kg (Calculated) : 75.3  Vital Signs: Temp: 98.4 F (36.9 C) (10/26 1404) Temp Source: Oral (10/26 1404) BP: 130/63 mmHg (10/26 1404) Pulse Rate: 70 (10/26 1404)  Labs:  Recent Labs  10/28/13 0219 10/29/13 0238 10/30/13 0530  HGB  --  10.0*  --   HCT  --  30.2*  --   PLT  --  175  --   LABPROT 31.4* 28.6* 25.7*  INR 3.00* 2.66* 2.32*  CREATININE 1.09 1.13 0.99    Estimated Creatinine Clearance: 69.7 ml/min (by C-G formula based on Cr of 0.99).     Assessment: 42 YOM admitted on 10/26/2013 for ablation.  He is on on chronic warfarin for a long, complex history of Afib.  Patient is s/p MAZE, LAA clipping, and multiple cardioversions.  INR therapeutic; no bleeding reported.   Goal of Therapy:  INR 2-3    Plan:  - Coumadin 7.5mg  PO today - Daily PT / INR if not discharged    Tishanna Dunford D. Mina Marble, PharmD, BCPS Pager:  [phone] 10/30/2013, 2:46 PM

## 2013-10-30 NOTE — Telephone Encounter (Signed)
New message    TCM appt with Tera Helper on  11/2 @ 2 pm

## 2013-10-30 NOTE — Discharge Summary (Signed)
Discharge Summary   Patient ID: [name] MRN: [id number], DOB/AGE: [birth year] 73 y.o. Admit date: 10/26/2013 D/C date:     10/30/2013  Primary Care Provider: Alesia Richards, MD Primary Cardiologist: Nahser / EP- Tanita Palinkas  Primary Discharge Diagnoses:  1. Paroxysmal atypical atrial flutter s/p RFA 10/26/13 2. Acute on chronic diastolic CHF 3. Acute on chronic respiratory failure  4. Left pleural effusion s/p thoracentesis 05/2013 5. Paroxysmal atrial fibrillation s/p MAZE and LAA clipping 04/2013 6. Hypertensive heart disease  7. Type 2 diabetes mellitus 8. CAD (coronary artery disease) s/p CABG x 2 (LIMA to LAD, SVG to RI) and maze procedure- 04/2013  9. Small PFO by TEE 10. Hypertension  11. Anemia  Secondary Discharge Diagnoses:  1. Hyperlipidemia 2. Hx of adenomatous colonic polyps  3. Diverticulosis 2001  4. OSA, compliant with CPAP 5. H/O hiatal hernia   6. Basal cell carcinoma  7. Adrenal adenoma  8. GERD (gastroesophageal reflux disease)   9. Depression   10. Anxiety  11. DJD (degenerative joint disease)   Hospital Course: [name] is a 73 y.o. male with a h/o CAD s/p CABGx2 in 04/2013, DM, HTN, anxiety, HL, persistent atrial fibrillation s/p MAZE and LAA clipping 4/15, and paroxysmal atrial flutter who present to [facility] 10/26/2013 for planned atrial flutter ablation. He was initially diagnosed with atrial fibrillation more than 10 years ago. He was cardioverted several times by Dr Doreatha Lew. He was evaluated by Dr Rolland Porter who recommended Tikosyn and follow-up for ablation. He did not return for follow-up but did very well on Tikosyn for 8-9 years. His afib became persistent and he underwent surgical MAZE and LAA clipping with CABG 4/15. He was discharged on Coumadin and amiodarone. He has done reasonably well since that time. He had L pleural effusion with subsequent drainage. He has had ongoing difficulty with SOB. In 8/15, he presented with atypical atrial  flutter. He was subsequently cardioverted and had improvement clinically. Unfortunately, he returned to what appeared to be the same atypical atrial flutter. Though he was rate controlled, he remained symptomatic. He was therefore referred to EP Dr. Rayann Heman who recommended atrial flutter ablation. He was brought into the hospital 10/26/13 for this procedure.  Prior to ablation, he underwent TEE on 10/26/13 showing normal LV/RV size and function, mild MR/TR/PR, RA moderately dilated, LA moderately dilated, no evidence of thrombus, the LA appendage was clipped during his MAZE procedure but he still has a small residual appendage with no thrombus noted;  interatrial septum is hypermobile with evidence of small PFO by color flow doppler and agitated saline contrast injection with bidirectional flow, moderate sized left pleural effusion. He subsequently underwent: 1. Left atrial flutter upon presentation.  2. Rotational Angiography reveals a moderate sized left atrium with four separate pulmonary veins without evidence of pulmonary vein stenosis.  3. Left atrial flutter successfully ablated along the anteroseptal aspect of the left atrium  4. Successful re-isolation of the left superior pulmonary vein. The right PVs and left inferior pulmonary veins were quiescent from the prior ablation and did not require additional ablation today. There were no inducible arrhythmias post-ablation. Amiodarone and metoprolol were discontinued and he continued on Coumadin. CXR did demonstrate cardiomegaly with mild to moderate pulmonary edema and left pleural effusion, so in addition to monitoring post-ablation, he was treated with IV diuresis for acute on chronic diastolic CHF and acute respiratory failure requiring supplemental O2 (desats into the 70s on RA). With several days of IV diuresis his weight  went from 198 on admission to 188 lb, - 8.8L net. Dr. Rayann Heman feels that he has about 5 more lbs of fluid to go. He received  another dose of IV lasix this morning and then will go home on Lasix 80mg  BID. Dr. Roxy Manns saw the patient briefly and noted that he will plan repeat CXR in the office in a few weeks. If effusion persists despite adequate diuresis, we will need to consider repeat thoracentesis (not able to stop anticoagulation at present time due to recent ablation). His hypoxia has improved - today he ambulated on RA and did not desaturate below 91% thus will not qualify for home O2 at this time. Dr. Rayann Heman has seen and examined the patient today and feels he is stable for discharge.  The patient scheduling line was busy - I have left a message on our office's scheduling voicemail requesting a transition of care 7 day follow-up appointment, and our office will call the patient with this appointment. He already had one on 11/6 with Roderic Palau in the AF clinic and we will leave this (but it is too far out from this hospitalization to serve as his f/u appt). We will likely check a BMET at this visit.  Of note, his most recent Hgb was 10.0. He has no evidence of bleeding. 1 week prior it was 12.7 - question dilutional. Would consider repeating at f/u appointment. Med changes made this admission: stopping amiodarone, metoprolol, minoxidil; resuming valsartan today (held due to need for diuresis). He was given a prescription for Protonix, sending home on Lasix 80mg  BID and KCL 28meq BID. Pharmacy recommends to continue home Coumadin dose at 7.5mg  daily - even though his amiodarone was discontinued he does not appear to require a higher dose at present. He will have it checked at PCP's office this week.   Discharge Vitals: Blood pressure 154/80, pulse 66, temperature 97.2 F (36.2 C), temperature source Oral, resp. rate 17, height 5\' 11"  (1.803 m), weight 188 lb 0.8 oz (85.3 kg), SpO2 99.00%.  Labs: Lab Results  Component Value Date   WBC 9.8 10/29/2013   HGB 10.0* 10/29/2013   HCT 30.2* 10/29/2013   MCV 93.2 10/29/2013    PLT 175 10/29/2013     Recent Labs Lab 10/30/13 0530  NA 142  K 3.8  CL 100  CO2 31  BUN 27*  CREATININE 0.99  CALCIUM 8.9  GLUCOSE 127*    Lab Results  Component Value Date   CHOL 100 09/26/2013   HDL 31* 09/26/2013   LDLCALC 51 09/26/2013   TRIG 90 09/26/2013     Diagnostic Studies/Procedures   Dg Chest 2 View 10/30/2013   CLINICAL DATA:  Left pleural effusion, followup  EXAM: CHEST  2 VIEW  COMPARISON:  Portable chest x-ray of 10/28/2013  FINDINGS: There is still considerable opacity at the left lung base with air bronchograms, suspicious for left lower lobe pneumonia and possibly left effusion. Cardiomegaly and minimal pulmonary vascular congestion again is noted. Median sternotomy sutures are present from prior CABG and a left atrial appendage exclusion device is noted.  IMPRESSION: 1. Opacity in the left lower lobe suspicious for pneumonia and possible small effusion. 2. Cardiomegaly.  Question mild pulmonary vascular congestion.   Electronically Signed   By: Ivar Drape M.D.   On: 10/30/2013 08:08   Dg Chest 2 View 10/26/2013   CLINICAL DATA:  Shortness of breath. Atrial fibrillation. Pleural effusion. Preprocedural for cardiac catheterization. Prior CABG.  EXAM: CHEST  2 VIEW  COMPARISON:  10/05/2013  FINDINGS: Prior CABG and atrial clip noted. Moderately enlarged cardiopericardial silhouette. Bilateral interstitial and perihilar airspace opacities favoring edema. Small to moderate left pleural effusion.  IMPRESSION: 1. Cardiomegaly with mild to moderate pulmonary edema and left pleural effusion.   Electronically Signed   By: Sherryl Barters M.D.   On: 10/26/2013 09:49   Dg Chest Port 1 View 10/28/2013   CLINICAL DATA:  Shortness of breath, personal history of diabetes mellitus type 2, hypertension, hypertensive cardiomyopathy, coronary artery disease  EXAM: PORTABLE CHEST - 1 VIEW  COMPARISON:  Portable exam 1532 hr compared to 10/26/2013  FINDINGS: Enlargement of cardiac  silhouette post CABG.  Mediastinal contours normal.  Pulmonary vascular congestion.  Perihilar infiltrates compatible with pulmonary edema.  Increased LEFT pleural effusion and basilar atelectasis.  Minimal RIGHT base atelectasis.  No pneumothorax.  Bones demineralized.  IMPRESSION: CHF with increased LEFT pleural effusion and basilar atelectasis.   Electronically Signed   By: Lavonia Dana M.D.   On: 10/28/2013 17:40   TEE 10/26/13 - Left ventricle: Systolic function was normal. The estimated ejection fraction was in the range of 60% to 65%. Wall motion was normal; there were no regional wall motion abnormalities. - Aortic valve: No evidence of vegetation. There was trivial regurgitation. - Mitral valve: There was mild regurgitation. - Left atrium: No evidence of thrombus in the atrial cavity or appendage. No evidence of thrombus in the atrial cavity or appendage. - Right atrium: No evidence of thrombus in the atrial cavity or appendage. - Atrial septum: There was increased thickness of the septum, consistent with lipomatous hypertrophy. There was a small patent foramen ovale. Agitated saline contrast study showed a very small bidirectional shunt through a patent foramen ovale, in the baseline state. - Pulmonic valve: There was trivial regurgitation. - Pericardium, extracardiac: There was a right pleural effusion. There was a left pleural effusion.   Discharge Medications   Current Discharge Medication List    START taking these medications   Details  pantoprazole (PROTONIX) 40 MG tablet Take 1 tablet (40 mg total) by mouth daily. Qty: 30 tablet, Refills: 1      CONTINUE these medications which have CHANGED   Details  furosemide (LASIX) 40 MG tablet Take 2 tablets (80 mg total) by mouth 2 (two) times daily. Qty: 120 tablet, Refills: 1    potassium chloride SA (K-DUR,KLOR-CON) 20 MEQ tablet Take 2 tablets (40 mEq total) by mouth 2 (two) times daily. Qty: 120 tablet, Refills: 1        CONTINUE these medications which have NOT CHANGED   Details  acetaminophen (TYLENOL) 500 MG tablet Take 1,000 mg by mouth every 6 (six) hours as needed for mild pain.     ALPRAZolam (XANAX) 1 MG tablet Take 1 mg by mouth at bedtime as needed for anxiety or sleep.    atorvastatin (LIPITOR) 20 MG tablet Take 20 mg by mouth 3 (three) times a week. Monday Wednesday and Friday    BESIVANCE 0.6 % SUSP Place 1 drop into the left eye 4 (four) times daily as needed (after injection).     cloNIDine (CATAPRES) 0.2 MG tablet Take 0.2 mg by mouth 2 (two) times daily.     diphenhydrAMINE (BENADRYL) 25 MG tablet Take 25 mg by mouth every 6 (six) hours as needed for allergies.     glimepiride (AMARYL) 2 MG tablet Take 2 mg by mouth daily with breakfast. If am fasting blood glucose is less than  120 hold amaryl dose.  For fasting blood glucose over 120 take 1/2 amaryl (1mg ).    HYDROcodone-acetaminophen (VICODIN) 5-500 MG per tablet Take 1 tablet by mouth every 6 (six) hours as needed for pain.    metFORMIN (GLUCOPHAGE-XR) 500 MG 24 hr tablet Take 1,000 mg by mouth 2 (two) times daily before a meal.     ranitidine (ZANTAC) 150 MG tablet Take 150 mg by mouth daily as needed for heartburn.     sertraline (ZOLOFT) 100 MG tablet Take 100 mg by mouth daily.    valsartan (DIOVAN) 320 MG tablet Take 160 mg by mouth daily.    warfarin (COUMADIN) 5 MG tablet Take 7.5 mg by mouth daily.       STOP taking these medications     amiodarone (PACERONE) 200 MG tablet      metoprolol tartrate (LOPRESSOR) 25 MG tablet      minoxidil (LONITEN) 10 MG tablet         Disposition   The patient will be discharged in stable condition to home. Discharge Instructions   Diet - low sodium heart healthy    Complete by:  As directed      Increase activity slowly    Complete by:  As directed   No driving for 2 days (unless otherwise instructed by your doctor). No lifting over 5 lbs for 1 week. No sexual  activity for 1 week. Keep procedure site clean & dry. If you notice increased pain, swelling, bleeding or pus, call/return!  You may shower, but no soaking baths/hot tubs/pools for 1 week.  Stop minoxidil, metoprolol, and amiodarone. Start protonix. Your doses of Lasix and potassium have changed.          Follow-up Information   Follow up with Alesia Richards, MD. (Please get your Coumadin level checked on Thursday 11/02/13 at 2:45pm.)    Specialty:  Internal Medicine   Contact information:   [address] [place] [address] [phone]       Follow up with [facility]. (Office will call you for your followup appointment. Call office if you have not heard back in 3 days.)    Specialty:  Cardiology   Contact information:   [address], [place] [address] [phone]        Duration of Discharge Encounter: Greater than 30 minutes including physician and PA time.  Signed, Lisbeth Renshaw Dunn PA-C 10/30/2013, 9:30 AM  Thompson Grayer MD

## 2013-10-31 ENCOUNTER — Other Ambulatory Visit: Payer: Self-pay | Admitting: *Deleted

## 2013-10-31 DIAGNOSIS — Z951 Presence of aortocoronary bypass graft: Secondary | ICD-10-CM

## 2013-10-31 NOTE — Telephone Encounter (Signed)
Patient contacted regarding discharge from [facility] on 10/30/2013.  Patient understands to follow up with provider Truitt Merle on 11/06/13 at 2 pm at [facility]. Patient understands discharge instructions? yes Patient understands medications and regiment? yes Patient understands to bring all medications to this visit? yes  Doesn't want to take protonix - states he has been on all the same meds for yrs and hasn't had any problems before.

## 2013-11-02 ENCOUNTER — Other Ambulatory Visit: Payer: Self-pay | Admitting: Cardiovascular Disease

## 2013-11-02 ENCOUNTER — Ambulatory Visit (INDEPENDENT_AMBULATORY_CARE_PROVIDER_SITE_OTHER): Payer: Medicare Other | Admitting: Physician Assistant

## 2013-11-02 VITALS — BP 104/64 | HR 64 | Temp 97.9°F | Resp 16 | Ht 71.0 in | Wt 192.0 lb

## 2013-11-02 DIAGNOSIS — J948 Other specified pleural conditions: Secondary | ICD-10-CM

## 2013-11-02 DIAGNOSIS — J90 Pleural effusion, not elsewhere classified: Secondary | ICD-10-CM

## 2013-11-02 DIAGNOSIS — Z7901 Long term (current) use of anticoagulants: Secondary | ICD-10-CM

## 2013-11-02 DIAGNOSIS — I480 Paroxysmal atrial fibrillation: Secondary | ICD-10-CM

## 2013-11-02 LAB — CBC WITH DIFFERENTIAL/PLATELET
Basophils Absolute: 0.1 10*3/uL (ref 0.0–0.1)
Basophils Relative: 1 % (ref 0–1)
EOS ABS: 0.1 10*3/uL (ref 0.0–0.7)
Eosinophils Relative: 2 % (ref 0–5)
HCT: 34.2 % — ABNORMAL LOW (ref 39.0–52.0)
HEMOGLOBIN: 11.3 g/dL — AB (ref 13.0–17.0)
LYMPHS ABS: 1 10*3/uL (ref 0.7–4.0)
LYMPHS PCT: 14 % (ref 12–46)
MCH: 29.7 pg (ref 26.0–34.0)
MCHC: 33 g/dL (ref 30.0–36.0)
MCV: 90 fL (ref 78.0–100.0)
Monocytes Absolute: 0.4 10*3/uL (ref 0.1–1.0)
Monocytes Relative: 6 % (ref 3–12)
NEUTROS ABS: 5.6 10*3/uL (ref 1.7–7.7)
NEUTROS PCT: 77 % (ref 43–77)
Platelets: 341 10*3/uL (ref 150–400)
RBC: 3.8 MIL/uL — AB (ref 4.22–5.81)
RDW: 14.5 % (ref 11.5–15.5)
WBC: 7.3 10*3/uL (ref 4.0–10.5)

## 2013-11-02 LAB — BASIC METABOLIC PANEL WITH GFR
BUN: 27 mg/dL — ABNORMAL HIGH (ref 6–23)
CALCIUM: 8.8 mg/dL (ref 8.4–10.5)
CO2: 29 meq/L (ref 19–32)
Chloride: 100 mEq/L (ref 96–112)
Creat: 1.37 mg/dL — ABNORMAL HIGH (ref 0.50–1.35)
GFR, Est African American: 58 mL/min — ABNORMAL LOW
GFR, Est Non African American: 50 mL/min — ABNORMAL LOW
Glucose, Bld: 220 mg/dL — ABNORMAL HIGH (ref 70–99)
POTASSIUM: 4.4 meq/L (ref 3.5–5.3)
Sodium: 138 mEq/L (ref 135–145)

## 2013-11-02 LAB — PROTIME-INR
INR: 1.87 — ABNORMAL HIGH (ref <1.50)
PROTHROMBIN TIME: 21.5 s — AB (ref 11.6–15.2)

## 2013-11-02 NOTE — Patient Instructions (Signed)
Atelectasis Atelectasis is a collapse of the small air sacs in the lungs (alveoli). When this occurs, all or part of a lung collapses and becomes airless. It can be caused by various things and is a common problem after surgery. The severity of atelectasis will vary depending on the size of the area involved and the underlying cause of the condition. CAUSES  There are multiple causes for atelectasis:   Shallow breathing, particularly if there is an injury to your chest wall or abdomen that makes it painful to take a deep breath. This commonly occurs after surgery.  Obstruction of your airways (bronchi or bronchioles). This may be caused by a buildup of mucus (mucus plug), tumors, blood clots (pulmonary embolus), or inhaled foreign bodies. Mucus plugs occur when the lungs do not expand enough to get rid of mucus.  Outside pressure on the lung. This may be caused by tumors, fluid (pleural effusion), or a leakage of air between the lung and rib cage (pneumothorax).   Infections such as pneumonia.  Scarring in lung tissue left over from previous infection or injury.  Some diseases such as cystic fibrosis. SIGNS AND SYMPTOMS  Often, atelectasis will have no symptoms. When symptoms occur, they include:  Shortness of breath.   Bluish color to your nails, lips, or mouth (cyanosis). DIAGNOSIS  Your health care provider may suspect atelectasis based on symptoms and physical findings. A chest X-ray may be done to confirm the diagnosis. More specialized X-ray exams are sometimes required.  TREATMENT  Treatment will depend on the cause of the atelectasis. Treatment may include:  Purposeful coughing to loosen mucus plugs in the lungs.  Chest physiotherapy. This consists of clapping or percussion on the chest over the lungs to further loosen mucus plugs.  Postural drainage techniques. This involves positioning your body so your head is lower than your chest. [place]  relaxed deep breathing whenever you are sitting down. A good technique is to take a few relaxed deep breaths each time a commercial comes on if you are watching television.  If you were given a deep breathing device (such as an incentive spirometer) or a mucus clearance device, use this regularly as directed by your health care provider.  Try to cough several times a day as directed by your health care provider.  Perform any chest physiotherapy or postural drainage techniques as directed by your health care provider. If necessary, have someone (such as a family member) assist you with these techniques.  When lying down, lie on the unaffected side to encourage mucus drainage.  Stay physically active as much as possible. SEEK IMMEDIATE MEDICAL CARE IF:   You develop increasing problems with your breathing.   You develop severe chest pain.   You develop severe coughing, or you cough up blood.   You have a fever or persistent symptoms for more than 2-3 days.   You have a fever and your symptoms suddenly get worse.  MAKE SURE YOU:  Understand these instructions.  Will watch your condition.  Will get help right away if you are not doing well or get worse. Document Released: 12/22/2004 Document Revised: 12/27/2012 Document Reviewed: 06/29/2012 [facility] Patient Information 2015 [place], [place]. This information is not intended to replace advice given to you by your health care provider. Make sure you discuss any questions you have with your health care provider.

## 2013-11-02 NOTE — Progress Notes (Signed)
Coumadin follow up Patient is s/p CABG and MAZE for Multivessel CAD and Afib on 4/15 and developed atypical Aflutter and left pleural effusion. He had a thoracentesis of his L. Effusion, his O2 in the office today is 97% RA.  He has been very symptomatic due to the atrial flutter/plerual effusion, having DOE,fatigue. After failing cardioversion on 08/15, he had a successful ablation with Dr. Rayann Heman on 10/26/2013. While in the hospital he was dieresised and has continued on lasix 80mg  BID out patient. He states that he is doing much better since the procedure, he has increased exercise capacity and has been doing spirometer at home with a 20% improvement.  Patient's last INR is  Lab Results  Component Value Date   INR 2.32* 10/30/2013   INR 2.66* 10/29/2013   INR 3.00* 10/28/2013    Patient denies SOB, CP, dizziness, nose bleeds, easy bleeding, and blood in stool/urine. He is on Lasix 80mg  twice daily, he is on Coumadin 7.5mg  Daily.  His coumadin dose was not changed last visit. He has not taken ABX, has not missed any doses.  Wt Readings from Last 3 Encounters:  11/02/13 192 lb (87.091 kg)  10/30/13 188 lb 0.8 oz (85.3 kg)  10/30/13 188 lb 0.8 oz (85.3 kg)     Current Outpatient Prescriptions on File Prior to Visit  Medication Sig Dispense Refill  . acetaminophen (TYLENOL) 500 MG tablet Take 1,000 mg by mouth every 6 (six) hours as needed for mild pain.       [name] ALPRAZolam (XANAX) 1 MG tablet Take 1 mg by mouth at bedtime as needed for anxiety or sleep.      [name] atorvastatin (LIPITOR) 20 MG tablet Take 20 mg by mouth 3 (three) times a week. Monday Wednesday and Friday      . BESIVANCE 0.6 % SUSP Place 1 drop into the left eye 4 (four) times daily as needed (after injection).       . cloNIDine (CATAPRES) 0.2 MG tablet Take 0.2 mg by mouth 2 (two) times daily.       . diphenhydrAMINE (BENADRYL) 25 MG tablet Take 25 mg by mouth every 6 (six) hours as needed for allergies.       . furosemide  (LASIX) 40 MG tablet Take 2 tablets (80 mg total) by mouth 2 (two) times daily.  120 tablet  1  . glimepiride (AMARYL) 2 MG tablet Take 2 mg by mouth daily with breakfast. If am fasting blood glucose is less than 120 hold amaryl dose.  For fasting blood glucose over 120 take 1/2 amaryl (1mg ).      . HYDROcodone-acetaminophen (VICODIN) 5-500 MG per tablet Take 1 tablet by mouth every 6 (six) hours as needed for pain.      . metFORMIN (GLUCOPHAGE-XR) 500 MG 24 hr tablet Take 1,000 mg by mouth 2 (two) times daily before a meal.       . pantoprazole (PROTONIX) 40 MG tablet Take 1 tablet (40 mg total) by mouth daily.  30 tablet  1  . potassium chloride SA (K-DUR,KLOR-CON) 20 MEQ tablet Take 2 tablets (40 mEq total) by mouth 2 (two) times daily.  120 tablet  1  . ranitidine (ZANTAC) 150 MG tablet Take 150 mg by mouth daily as needed for heartburn.       . sertraline (ZOLOFT) 100 MG tablet Take 100 mg by mouth daily.      . valsartan (DIOVAN) 320 MG tablet Take 160 mg by mouth daily.      [name]  warfarin (COUMADIN) 5 MG tablet Take 7.5 mg by mouth daily.        No current facility-administered medications on file prior to visit.   Past Medical History  Diagnosis Date  . Hx of adenomatous colonic polyps   . Diabetes mellitus type II   . Hypertension   . Diverticulosis 2001  . Hyperlipidemia   . Persistent atrial fibrillation     a. s/p MAZE 04/2013 in setting of CABG. b. Amio stopped in 10/2013 after flutter ablation.  . Obstructive sleep apnea     compliant with CPAP  . Chronic diastolic congestive heart failure   . Hypertensive cardiomyopathy   . History of cardioversion     x3 (years uncertain)  . H/O hiatal hernia   . Basal cell carcinoma   . Adrenal adenoma   . CAD (coronary artery disease)     a. 04/2013 CABG x 2: LIMA to LAD, SVG to RI, EVH via R thigh.  . S/P Maze operation for atrial fibrillation     a. 04/2013: Complete bilateral atrial lesion set using cryothermy and bipolar  radiofrequency ablation with clipping of LA appendage (@ time of CABG)  . GERD (gastroesophageal reflux disease)   . Depression   . Anxiety   . DJD (degenerative joint disease)   . 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 DCCV 08/2013. b. s/p RFA 10/2013.  [name] Respiratory failure     a. Hypoxia 10/2013 - required supp O2 as inpatient, did not require it at discharge.  . Pleural effusion, left     a. s/p thoracentesis 05/2013.  [name] PFO (patent foramen ovale)     a. Small PFO by TEE 10/2013.   Allergies  Allergen Reactions  . Sunflower Seed [Sunflower Oil] Swelling and Other (See Comments)    Tongue and lip swelling  . Horse-Derived Products Other (See Comments)    Based on skin test reaction  . Other Other (See Comments)    Tetanus Shot -- skin test reaction    ROS Constitutional: Denies fever, chills, headaches, fatigue. Cardio: Denies chest pain, palpitations, irregular heartbeat, syncope, dyspnea, diaphoresis, orthopnea, PND, claudication, edema Respiratory: denies cough, dyspnea, DOE, pleurisy, hoarseness, laryngitis, wheezing.  Gastrointestinal: Denies dysphagia, heartburn, reflux, pain, cramps, nausea, diarrhea, constipation, hematemesis, melena, hematochezia Genitourinary: Denies dysuria, frequency, hematuria, flank pain Musculoskeletal: Denies arthralgia, myalgia, stiffness, Jt. Swelling, pain, limp, strain/sprain. Skin: Denies rash, ecchymosis, petechial. Neuro: Denies Weakness, tremor, incoordination, spasms, paresthesia, pain Heme/Lymph: Denies Excessive bleeding, bruising, enlarged lymph nodes  Physical: Blood pressure 104/64, pulse 64, temperature 97.9 F (36.6 C), resp. rate 16, height 5\' 11"  (1.803 m), weight 192 lb (87.091 kg). Filed Weights   11/02/13 1450  Weight: 192 lb (87.091 kg)    General Appearance: Well nourished, in no apparent distress. ENT/Mouth: Nares clear with no erythema, swelling, mucus on turbinates. No ulcers, cracking, on lips. No erythema,  swelling, or exudate on post pharynx.  Neck: Supple, thyroid normal.  Respiratory: CTAB, decreased breath sounds LLL.    Cardio: RRR, with holosystolic murmur, decreased heart sounds RSB. No edema  Abdomen: Soft, with bowl sounds. Non tender, no guarding, rebound, hernias, masses, or organomegaly.  Skin: Warm, dry without rashes, lesions, ecchymosis.  Neuro: Unremarkable  Assessment and plan: Chronic anticoagulation- check INR and will adjust medication according to labs.  Discussed if patient falls to immediately contact office or go to ER. Discussed foods that can increase or decrease Coumadin levels. Patient understands to call the office before starting  a new medication. Follow up in one month.   CKD/hyponatremia, on lasix for pleural effusion/diastolic dysfunction- recheck BMP, will follow up with Dr. Roxy Manns for CXR, 02 97% RA.   Anemia- recheck CBC  Will send labs to Dr. Rayann Heman and Dr. Roxy Manns

## 2013-11-03 ENCOUNTER — Other Ambulatory Visit: Payer: Self-pay | Admitting: *Deleted

## 2013-11-06 ENCOUNTER — Encounter: Payer: Self-pay | Admitting: Nurse Practitioner

## 2013-11-06 ENCOUNTER — Telehealth: Payer: Self-pay | Admitting: *Deleted

## 2013-11-06 ENCOUNTER — Ambulatory Visit (INDEPENDENT_AMBULATORY_CARE_PROVIDER_SITE_OTHER): Payer: Medicare Other | Admitting: Nurse Practitioner

## 2013-11-06 ENCOUNTER — Other Ambulatory Visit: Payer: Self-pay | Admitting: *Deleted

## 2013-11-06 VITALS — BP 136/74 | HR 72 | Ht 71.0 in | Wt 185.0 lb

## 2013-11-06 DIAGNOSIS — R0600 Dyspnea, unspecified: Secondary | ICD-10-CM

## 2013-11-06 DIAGNOSIS — Z951 Presence of aortocoronary bypass graft: Secondary | ICD-10-CM

## 2013-11-06 DIAGNOSIS — Z9889 Other specified postprocedural states: Secondary | ICD-10-CM

## 2013-11-06 DIAGNOSIS — Z8679 Personal history of other diseases of the circulatory system: Secondary | ICD-10-CM

## 2013-11-06 DIAGNOSIS — I483 Typical atrial flutter: Secondary | ICD-10-CM

## 2013-11-06 DIAGNOSIS — I5032 Chronic diastolic (congestive) heart failure: Secondary | ICD-10-CM

## 2013-11-06 DIAGNOSIS — I10 Essential (primary) hypertension: Secondary | ICD-10-CM

## 2013-11-06 LAB — CBC
HCT: 35.3 % — ABNORMAL LOW (ref 39.0–52.0)
Hemoglobin: 11.5 g/dL — ABNORMAL LOW (ref 13.0–17.0)
MCHC: 32.6 g/dL (ref 30.0–36.0)
MCV: 92.1 fl (ref 78.0–100.0)
Platelets: 411 10*3/uL — ABNORMAL HIGH (ref 150.0–400.0)
RBC: 3.83 Mil/uL — ABNORMAL LOW (ref 4.22–5.81)
RDW: 14.4 % (ref 11.5–15.5)
WBC: 8.5 10*3/uL (ref 4.0–10.5)

## 2013-11-06 LAB — BASIC METABOLIC PANEL
BUN: 26 mg/dL — ABNORMAL HIGH (ref 6–23)
CO2: 28 mEq/L (ref 19–32)
Calcium: 9.3 mg/dL (ref 8.4–10.5)
Chloride: 102 mEq/L (ref 96–112)
Creatinine, Ser: 1.4 mg/dL (ref 0.4–1.5)
GFR: 54.82 mL/min — ABNORMAL LOW (ref >60.00)
Glucose, Bld: 87 mg/dL (ref 70–99)
Potassium: 4 mEq/L (ref 3.5–5.1)
Sodium: 140 mEq/L (ref 135–145)

## 2013-11-06 LAB — BRAIN NATRIURETIC PEPTIDE: Pro B Natriuretic peptide (BNP): 235 pg/mL — ABNORMAL HIGH (ref 0.0–100.0)

## 2013-11-06 NOTE — Telephone Encounter (Signed)
Patient called and requested RX for Acsensia meter, test strips and lancets be sent to [facility]([phone]) .  RX sent for 1 meter and #100 each of the test strips and lancets to check glucose 1 time daily.

## 2013-11-06 NOTE — Progress Notes (Signed)
[name] Date of Birth: January 10, 1940 Medical Record [id number]  History of Present Illness: [name] is seen back today for a post hospital/TOC visit. Seen for Dr. Rayann Heman and Dr. Acie Fredrickson. Former patient of Dr. Susa Simmonds. He has HLD, diverticulosis, OSA on CPAP, hiatal hernia, adrenal adenoma, GERD, depression, DJD and paroxysmal atrial flutter. He has chronic diastolic HF, HTN, type 2 DM and CAD with prior CABG x 2 with LIMA to LAD and SVG to RM with a MAZE & LAA clipping in April of 2015.   He has had issues with atrial fib for over 10 years - has had prior cardioversion - evaluated by Dr. Rolland Porter who recommended Tikosyn and follow up ablation - he did not follow up but did well on Tikosyn for 8 to 9 years. He had his MAZE and clipping associated with his CABG back in April - did have issues with post op dyspnea - had to have thoracentesis. He had persistent atrial flutter and was referred to EP. He has now had RFA by Dr. Rayann Heman on 10/26/13 - taken off of his antiarrhythmic (amiodarone) as well as metoprolol. His coumadin was continued. He has had a persistent effusion - seen again by Dr. Roxy Manns - not able to have repeat thoracentesis due to recent ablation and not being able to stop his anticoagulation.   Comes back today. Here alone. He is doing "so much better". No chest pain. Not short of breath. No feelings of palpitations. Has actually used a back pack blower yesterday for 30 minutes. Has been up on his roof as well. Not lightheaded or dizzy. He is STILL taking amiodarone, minoxidil, and metoprolol - says he was not told to stop these medicines but would be happy to discontinue. Coumadin monitored by his PCP - he would like to continue this due to more affordability.   Current Outpatient Prescriptions  Medication Sig Dispense Refill  . acetaminophen (TYLENOL) 500 MG tablet Take 1,000 mg by mouth every 6 (six) hours as needed for mild pain.     [name] ALPRAZolam (XANAX) 1 MG tablet Take 1 mg by mouth  at bedtime as needed for anxiety or sleep.    [name] atorvastatin (LIPITOR) 20 MG tablet Take 20 mg by mouth 3 (three) times a week. Monday Wednesday and Friday    . BESIVANCE 0.6 % SUSP Place 1 drop into the left eye 4 (four) times daily as needed (after injection).     . cloNIDine (CATAPRES) 0.2 MG tablet Take 0.2 mg by mouth 2 (two) times daily.     . diphenhydrAMINE (BENADRYL) 25 MG tablet Take 25 mg by mouth every 6 (six) hours as needed for allergies.     . furosemide (LASIX) 40 MG tablet Take 2 tablets (80 mg total) by mouth 2 (two) times daily. 120 tablet 1  . glimepiride (AMARYL) 2 MG tablet Take 2 mg by mouth daily with breakfast. If am fasting blood glucose is less than 120 hold amaryl dose.  For fasting blood glucose over 120 take 1/2 amaryl (1mg ).    . HYDROcodone-acetaminophen (VICODIN) 5-500 MG per tablet Take 1 tablet by mouth every 6 (six) hours as needed for pain.    . metFORMIN (GLUCOPHAGE-XR) 500 MG 24 hr tablet Take 1,000 mg by mouth 2 (two) times daily before a meal.     . potassium chloride SA (K-DUR,KLOR-CON) 20 MEQ tablet Take 2 tablets (40 mEq total) by mouth 2 (two) times daily. 120 tablet 1  . ranitidine (ZANTAC) 150 MG  tablet Take 150 mg by mouth daily as needed for heartburn.     . sertraline (ZOLOFT) 100 MG tablet Take 100 mg by mouth daily.    . valsartan (DIOVAN) 160 MG tablet Take 160 mg by mouth daily.    [name] warfarin (COUMADIN) 5 MG tablet Take 7.5 mg by mouth daily. On Sunday 10 mg  Rest of the rest of week 7.5 mg     No current facility-administered medications for this visit.    Allergies  Allergen Reactions  . Sunflower Seed [Sunflower Oil] Swelling and Other (See Comments)    Tongue and lip swelling  . Horse-Derived Products Other (See Comments)    Based on skin test reaction  . Other Other (See Comments)    Tetanus Shot -- skin test reaction    Past Medical History  Diagnosis Date  . Hx of adenomatous colonic polyps   . Diabetes mellitus type II   .  Hypertension   . Diverticulosis 2001  . Hyperlipidemia   . Persistent atrial fibrillation     a. s/p MAZE 04/2013 in setting of CABG. b. Amio stopped in 10/2013 after flutter ablation.  . Obstructive sleep apnea     compliant with CPAP  . Chronic diastolic congestive heart failure   . Hypertensive cardiomyopathy   . History of cardioversion     x3 (years uncertain)  . H/O hiatal hernia   . Basal cell carcinoma   . Adrenal adenoma   . CAD (coronary artery disease)     a. 04/2013 CABG x 2: LIMA to LAD, SVG to RI, EVH via R thigh.  . S/P Maze operation for atrial fibrillation     a. 04/2013: Complete bilateral atrial lesion set using cryothermy and bipolar radiofrequency ablation with clipping of LA appendage (@ time of CABG)  . GERD (gastroesophageal reflux disease)   . Depression   . Anxiety   . DJD (degenerative joint disease)   . 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 DCCV 08/2013. b. s/p RFA 10/2013.  [name] Respiratory failure     a. Hypoxia 10/2013 - required supp O2 as inpatient, did not require it at discharge.  . Pleural effusion, left     a. s/p thoracentesis 05/2013.  [name] PFO (patent foramen ovale)     a. Small PFO by TEE 10/2013.    Past Surgical History  Procedure Laterality Date  . Polinydal cyst      Removed  . Great toe arthrodesis, interphalangeal joint      Right foot  . Retina repair-right    . Cataract extraction      bilateral  . Basal cell carcinoma excision      x3 on face  . Polypectomy    . Cardiac catheterization      myocardial bridge but no cad  . Coronary artery bypass graft N/A 04/05/2013    Procedure: CORONARY ARTERY BYPASS GRAFTING (CABG) TIMES TWO USING LEFT INTERNAL MAMMARY ARTERY AND RIGHT SAPHENOUS LEG VEIN HARVESTED ENDOSCOPICALLY;  Surgeon: Rexene Alberts, MD;  Location: [place];  Service: Open Heart Surgery;  Laterality: N/A;  . Maze N/A 04/05/2013    Procedure: MAZE;  Surgeon: Rexene Alberts, MD;  Location: [place];  Service: Open Heart Surgery;   Laterality: N/A;  . Intraoperative transesophageal echocardiogram N/A 04/05/2013    Procedure: INTRAOPERATIVE TRANSESOPHAGEAL ECHOCARDIOGRAM;  Surgeon: Rexene Alberts, MD;  Location: [place];  Service: Open Heart Surgery;  Laterality: N/A;  . Tee without cardioversion  N/A 08/23/2013    Procedure: TRANSESOPHAGEAL ECHOCARDIOGRAM (TEE);  Surgeon: Sanda Klein, MD;  Location: [place];  Service: Cardiovascular;  Laterality: N/A;  . Cardioversion N/A 08/23/2013    Procedure: CARDIOVERSION;  Surgeon: Sanda Klein, MD;  Location: [facility];  Service: Cardiovascular;  Laterality: N/A;  . Tee without cardioversion N/A 10/26/2013    Procedure: TRANSESOPHAGEAL ECHOCARDIOGRAM (TEE);  Surgeon: Sueanne Margarita, MD;  Location: [facility] ENDOSCOPY;  Service: Cardiovascular;  Laterality: N/A;    History  Smoking status  . Former Smoker -- 4.00 packs/day for 25 years  . Types: Cigarettes  . Quit date: 01/05/1981  Smokeless tobacco  . Never Used    History  Alcohol Use  . Yes    Comment: 1-5 drinks per week    Family History  Problem Relation Age of Onset  . Colon cancer Mother     Family History/Uncle   . Colon polyps Mother     Family History  . Atrial fibrillation Mother   . Hypertension Mother   . Colon polyps Sister     Family history  . Diabetes Maternal Uncle   . Stroke Paternal Uncle   . Dementia Father     Review of Systems: The review of systems is per the HPI.  All other systems were reviewed and are negative.  Physical Exam: BP 136/74 mmHg  Pulse 72  Ht 5\' 11"  (1.803 m)  Wt 185 lb (83.915 kg)  BMI 25.81 kg/m2 Patient is very pleasant and in no acute distress. Weight is down. Skin is warm and dry. Color is normal.  HEENT is unremarkable. Normocephalic/atraumatic. PERRL. Sclera are nonicteric. Neck is supple. No masses. No JVD. Lungs are clear. Cardiac exam shows a regular rate and rhythm. Abdomen is soft. Extremities are without edema. Gait and ROM are intact. No gross  neurologic deficits noted.  Wt Readings from Last 3 Encounters:  11/06/13 185 lb (83.915 kg)  11/02/13 192 lb (87.091 kg)  10/30/13 188 lb 0.8 oz (85.3 kg)    LABORATORY DATA/PROCEDURES: EKG today shows sinus with a 1st degree AV block. Tracing reviewed with Dr. Lovena Le.   Lab Results  Component Value Date   WBC 7.3 11/02/2013   HGB 11.3* 11/02/2013   HCT 34.2* 11/02/2013   PLT 341 11/02/2013   GLUCOSE 220* 11/02/2013   CHOL 100 09/26/2013   TRIG 90 09/26/2013   HDL 31* 09/26/2013   LDLCALC 51 09/26/2013   ALT 16 09/26/2013   AST 16 09/26/2013   NA 138 11/02/2013   K 4.4 11/02/2013   CL 100 11/02/2013   CREATININE 1.37* 11/02/2013   BUN 27* 11/02/2013   CO2 29 11/02/2013   TSH 1.393 09/26/2013   INR 1.87* 11/02/2013   HGBA1C 6.1* 09/26/2013    BNP (last 3 results)  Recent Labs  04/13/13 0140 08/21/13 1245 10/05/13 1118  PROBNP 2226.0* 902.6* 309.0*   EXAM: CHEST 2 VIEW  COMPARISON: Portable chest x-ray of 10/28/2013  FINDINGS: There is still considerable opacity at the left lung base with air bronchograms, suspicious for left lower lobe pneumonia and possibly left effusion. Cardiomegaly and minimal pulmonary vascular congestion again is noted. Median sternotomy sutures are present from prior CABG and a left atrial appendage exclusion device is noted.  IMPRESSION: 1. Opacity in the left lower lobe suspicious for pneumonia and possible small effusion. 2. Cardiomegaly. Question mild pulmonary vascular congestion.   Electronically Signed  By: Ivar Drape M.D.  On: 10/30/2013 08:08  Assessment / Plan: 1. S/P RFA for atrial flutter - remains in sinus - I have stopped his metoprolol and amiodarone.   2. Chronic coumadin anticoagulation -  Have recommended an INR with his PCP later this week for possible dose adjustment.   3. CAD - recent CABG x 2 back in April of 2015 with persistent pleural effusion  4.HTN -  Stopping Minoxidil -  I have asked him to monitor his BP  5. HLD  6. DM  Needs repeat labs today. He is not interested in taking his PPI therapy.  See back in 10 days - repeat EKG today. Rechecking BMET, BNP and CBC today as well.   Patient is agreeable to this plan and will call if any problems develop in the interim.   Burtis Junes, RN, [place] [address] [place], [place]  [address] [phone]

## 2013-11-06 NOTE — Patient Instructions (Addendum)
We will be checking the following labs today BMET, BNP and CBC  Get your coumadin checked later this week with your primary care doctor  Stay on your current medicines but we are stopping Amiodarone, metoprolol and Minoxidil  I need to see you in about 10 days - on a day that Dr. Rayann Heman is here  Call the [place] office at [phone] if you have any questions, problems or concerns.

## 2013-11-10 ENCOUNTER — Ambulatory Visit (INDEPENDENT_AMBULATORY_CARE_PROVIDER_SITE_OTHER): Payer: Medicare Other | Admitting: *Deleted

## 2013-11-10 ENCOUNTER — Encounter (HOSPITAL_COMMUNITY): Payer: Medicare Other | Admitting: Nurse Practitioner

## 2013-11-10 DIAGNOSIS — Z7901 Long term (current) use of anticoagulants: Secondary | ICD-10-CM

## 2013-11-10 DIAGNOSIS — I480 Paroxysmal atrial fibrillation: Secondary | ICD-10-CM

## 2013-11-10 NOTE — Progress Notes (Signed)
Patient ID: [name], male   DOB: 10/29/1940, 73 y.o.   MRN: [id number] Patient presents for recheck PT/INR.  Patient states she currently is taking Coumadin 7.5 mg times 6 days and 10 mg on Sundays.

## 2013-11-11 LAB — PROTIME-INR
INR: 2.13 — ABNORMAL HIGH (ref <1.50)
PROTHROMBIN TIME: 23.8 s — AB (ref 11.6–15.2)

## 2013-11-17 ENCOUNTER — Encounter: Payer: Self-pay | Admitting: Nurse Practitioner

## 2013-11-17 ENCOUNTER — Ambulatory Visit (INDEPENDENT_AMBULATORY_CARE_PROVIDER_SITE_OTHER): Payer: Medicare Other | Admitting: Nurse Practitioner

## 2013-11-17 ENCOUNTER — Telehealth: Payer: Self-pay | Admitting: *Deleted

## 2013-11-17 VITALS — BP 130/80 | HR 57 | Ht 71.5 in | Wt 186.1 lb

## 2013-11-17 DIAGNOSIS — I10 Essential (primary) hypertension: Secondary | ICD-10-CM

## 2013-11-17 DIAGNOSIS — J90 Pleural effusion, not elsewhere classified: Secondary | ICD-10-CM

## 2013-11-17 DIAGNOSIS — J948 Other specified pleural conditions: Secondary | ICD-10-CM

## 2013-11-17 DIAGNOSIS — Z9889 Other specified postprocedural states: Secondary | ICD-10-CM

## 2013-11-17 DIAGNOSIS — I5032 Chronic diastolic (congestive) heart failure: Secondary | ICD-10-CM

## 2013-11-17 DIAGNOSIS — Z8679 Personal history of other diseases of the circulatory system: Secondary | ICD-10-CM

## 2013-11-17 NOTE — Progress Notes (Signed)
[name] Date of Birth: 17-Dec-1940 Medical Record [id number]  History of Present Illness: [name] is seen back today for a follow up visit. Seen for Dr. Rayann Heman and Dr. Acie Fredrickson. Former patient of Dr. Susa Simmonds. He has HLD, diverticulosis, OSA on CPAP, hiatal hernia, adrenal adenoma, GERD, depression, DJD and paroxysmal atrial flutter. He has chronic diastolic HF, HTN, type 2 DM and CAD with prior CABG x 2 with LIMA to LAD and SVG to RM with a MAZE & LAA clipping in April of 2015.   He has had issues with atrial fib for over 10 years - has had prior cardioversion - evaluated by Dr. Rolland Porter who recommended Tikosyn and follow up ablation - he did not follow up but did well on Tikosyn for 8 to 9 years. He had his MAZE and clipping associated with his CABG back in April - did have issues with post op dyspnea - had to have thoracentesis. He had persistent atrial flutter and was referred to EP. He has now had RFA by Dr. Rayann Heman on 10/26/13 - taken off of his antiarrhythmic (amiodarone) as well as metoprolol. His coumadin was continued. He has had a persistent effusion - seen again by Dr. Roxy Manns - not able to have repeat thoracentesis due to recent ablation and not being able to stop his anticoagulation.   I saw him back about 10 days ago for his post hospital visit - he was doing well - was STILL taking his medicines that were to be discontinued at his discharge. I ended up stopping the amiodarone, minoxidil and metoprolol. He is back checking his coumadin with his PCP due to lower cost.   Comes back today. Here alone. He says he is doing "even better" than he was at his last visit. No chest pain. Breathing has improved. He can take his garbage can out to the street. He is continuing to blow leaves with his back pack blower. Has even been up on his roof. BP has been pretty good - he did have one incident where his BP was high - he is not sure if his cuff is right. His readings have otherwise been good.    Current Outpatient Prescriptions  Medication Sig Dispense Refill  . acetaminophen (TYLENOL) 500 MG tablet Take 1,000 mg by mouth every 6 (six) hours as needed for mild pain.     [name] ALPRAZolam (XANAX) 1 MG tablet Take 1 mg by mouth at bedtime as needed for anxiety or sleep.    [name] atorvastatin (LIPITOR) 20 MG tablet Take 20 mg by mouth 3 (three) times a week. Monday Wednesday and Friday    . BESIVANCE 0.6 % SUSP Place 1 drop into the left eye 4 (four) times daily as needed (after injection).     . cloNIDine (CATAPRES) 0.2 MG tablet Take 0.2 mg by mouth 2 (two) times daily.     . diphenhydrAMINE (BENADRYL) 25 MG tablet Take 25 mg by mouth every 6 (six) hours as needed for allergies.     . furosemide (LASIX) 40 MG tablet Take 2 tablets (80 mg total) by mouth 2 (two) times daily. 120 tablet 1  . glimepiride (AMARYL) 2 MG tablet Take 2 mg by mouth daily with breakfast. If am fasting blood glucose is less than 120 hold amaryl dose.  For fasting blood glucose over 120 take 1/2 amaryl (1mg ).    . HYDROcodone-acetaminophen (VICODIN) 5-500 MG per tablet Take 1 tablet by mouth every 6 (six) hours as needed for pain.    [name]  metFORMIN (GLUCOPHAGE-XR) 500 MG 24 hr tablet Take 1,000 mg by mouth 2 (two) times daily before a meal.     . potassium chloride SA (K-DUR,KLOR-CON) 20 MEQ tablet Take 2 tablets (40 mEq total) by mouth 2 (two) times daily. 120 tablet 1  . ranitidine (ZANTAC) 150 MG tablet Take 150 mg by mouth daily as needed for heartburn.     . sertraline (ZOLOFT) 100 MG tablet Take 100 mg by mouth daily.    . valsartan (DIOVAN) 160 MG tablet Take 160 mg by mouth daily.    [name] warfarin (COUMADIN) 5 MG tablet Take 7.5 mg by mouth daily. On Sunday 10 mg  Rest of the rest of week 7.5 mg     No current facility-administered medications for this visit.    Allergies  Allergen Reactions  . Sunflower Seed [Sunflower Oil] Swelling and Other (See Comments)    Tongue and lip swelling  . Horse-Derived Products  Other (See Comments)    Based on skin test reaction  . Other Other (See Comments)    Tetanus Shot -- skin test reaction    Past Medical History  Diagnosis Date  . Hx of adenomatous colonic polyps   . Diabetes mellitus type II   . Hypertension   . Diverticulosis 2001  . Hyperlipidemia   . Persistent atrial fibrillation     a. s/p MAZE 04/2013 in setting of CABG. b. Amio stopped in 10/2013 after flutter ablation.  . Obstructive sleep apnea     compliant with CPAP  . Chronic diastolic congestive heart failure   . Hypertensive cardiomyopathy   . History of cardioversion     x3 (years uncertain)  . H/O hiatal hernia   . Basal cell carcinoma   . Adrenal adenoma   . CAD (coronary artery disease)     a. 04/2013 CABG x 2: LIMA to LAD, SVG to RI, EVH via R thigh.  . S/P Maze operation for atrial fibrillation     a. 04/2013: Complete bilateral atrial lesion set using cryothermy and bipolar radiofrequency ablation with clipping of LA appendage (@ time of CABG)  . GERD (gastroesophageal reflux disease)   . Depression   . Anxiety   . DJD (degenerative joint disease)   . 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 DCCV 08/2013. b. s/p RFA 10/2013.  [name] Respiratory failure     a. Hypoxia 10/2013 - required supp O2 as inpatient, did not require it at discharge.  . Pleural effusion, left     a. s/p thoracentesis 05/2013.  [name] PFO (patent foramen ovale)     a. Small PFO by TEE 10/2013.    Past Surgical History  Procedure Laterality Date  . Polinydal cyst      Removed  . Great toe arthrodesis, interphalangeal joint      Right foot  . Retina repair-right    . Cataract extraction      bilateral  . Basal cell carcinoma excision      x3 on face  . Polypectomy    . Cardiac catheterization      myocardial bridge but no cad  . Coronary artery bypass graft N/A 04/05/2013    Procedure: CORONARY ARTERY BYPASS GRAFTING (CABG) TIMES TWO USING LEFT INTERNAL MAMMARY ARTERY AND RIGHT SAPHENOUS LEG VEIN  HARVESTED ENDOSCOPICALLY;  Surgeon: Rexene Alberts, MD;  Location: [place];  Service: Open Heart Surgery;  Laterality: N/A;  . Maze N/A 04/05/2013    Procedure: MAZE;  Surgeon: Braulio Conte  Keturah Barre, MD;  Location: [place];  Service: Open Heart Surgery;  Laterality: N/A;  . Intraoperative transesophageal echocardiogram N/A 04/05/2013    Procedure: INTRAOPERATIVE TRANSESOPHAGEAL ECHOCARDIOGRAM;  Surgeon: Rexene Alberts, MD;  Location: [place];  Service: Open Heart Surgery;  Laterality: N/A;  . Tee without cardioversion N/A 08/23/2013    Procedure: TRANSESOPHAGEAL ECHOCARDIOGRAM (TEE);  Surgeon: Sanda Klein, MD;  Location: [place];  Service: Cardiovascular;  Laterality: N/A;  . Cardioversion N/A 08/23/2013    Procedure: CARDIOVERSION;  Surgeon: Sanda Klein, MD;  Location: [facility] ENDOSCOPY;  Service: Cardiovascular;  Laterality: N/A;  . Tee without cardioversion N/A 10/26/2013    Procedure: TRANSESOPHAGEAL ECHOCARDIOGRAM (TEE);  Surgeon: Sueanne Margarita, MD;  Location: [facility] ENDOSCOPY;  Service: Cardiovascular;  Laterality: N/A;    History  Smoking status  . Former Smoker -- 4.00 packs/day for 25 years  . Types: Cigarettes  . Quit date: 01/05/1981  Smokeless tobacco  . Never Used    History  Alcohol Use  . Yes    Comment: 1-5 drinks per week    Family History  Problem Relation Age of Onset  . Colon cancer Mother     Family History/Uncle   . Colon polyps Mother     Family History  . Atrial fibrillation Mother   . Hypertension Mother   . Colon polyps Sister     Family history  . Diabetes Maternal Uncle   . Stroke Paternal Uncle   . Dementia Father     Review of Systems: The review of systems is per the HPI.  All other systems were reviewed and are negative.  Physical Exam: BP 130/80 mmHg  Pulse 57  Ht 5' 11.5" (1.816 m)  Wt 186 lb 1.9 oz (84.423 kg)  BMI 25.60 kg/m2  SpO2 97% Patient is very pleasant and in no acute distress. Skin is warm and dry. Color is normal.  HEENT is  unremarkable. Normocephalic/atraumatic. PERRL. Sclera are nonicteric. Neck is supple. No masses. No JVD. Lungs are clear but he is still diminished on the left. Cardiac exam shows a regular rate and rhythm. Abdomen is soft. Extremities are without edema. Gait and ROM are intact. No gross neurologic deficits noted.  Wt Readings from Last 3 Encounters:  11/17/13 186 lb 1.9 oz (84.423 kg)  11/06/13 185 lb (83.915 kg)  11/02/13 192 lb (87.091 kg)    LABORATORY DATA/PROCEDURES:  Lab Results  Component Value Date   WBC 8.5 11/06/2013   HGB 11.5* 11/06/2013   HCT 35.3* 11/06/2013   PLT 411.0* 11/06/2013   GLUCOSE 87 11/06/2013   CHOL 100 09/26/2013   TRIG 90 09/26/2013   HDL 31* 09/26/2013   LDLCALC 51 09/26/2013   ALT 16 09/26/2013   AST 16 09/26/2013   NA 140 11/06/2013   K 4.0 11/06/2013   CL 102 11/06/2013   CREATININE 1.4 11/06/2013   BUN 26* 11/06/2013   CO2 28 11/06/2013   TSH 1.393 09/26/2013   INR 2.13* 11/10/2013   HGBA1C 6.1* 09/26/2013    BNP (last 3 results)  Recent Labs  08/21/13 1245 10/05/13 1118 11/06/13 1502  PROBNP 902.6* 309.0* 235.0*   TEE Study Conclusions from October 2015  - Left ventricle: Systolic function was normal. The estimated ejection fraction was in the range of 60% to 65%. Wall motion was normal; there were no regional wall motion abnormalities. - Aortic valve: No evidence of vegetation. There was trivial regurgitation. - Mitral valve: There was mild regurgitation. - Left atrium: No  evidence of thrombus in the atrial cavity or appendage. No evidence of thrombus in the atrial cavity or appendage. - Right atrium: No evidence of thrombus in the atrial cavity or appendage. - Atrial septum: There was increased thickness of the septum, consistent with lipomatous hypertrophy. There was a small patent foramen ovale. Agitated saline contrast study showed a very small bidirectional shunt through a patent foramen ovale, in  the baseline state. - Pulmonic valve: There was trivial regurgitation. - Pericardium, extracardiac: There was a right pleural effusion. There was a left pleural effusion.   Assessment / Plan: 1. S/P RFA for atrial flutter - remains in sinus by exam today - Now off of his metoprolol and amiodarone.   2. Chronic coumadin anticoagulation - followed by his PCP.   3. CAD - recent CABG x 2 back in April of 2015 with persistent pleural effusion - no current symptoms. He is seeing Dr. Roxy Manns next week - no real symptoms noted.   4.HTN - off Minoxidil - he will continue to monitor.   5. HLD  We will see him back in January as planned.   Patient is agreeable to this plan and will call if any problems develop in the interim.   Burtis Junes, RN, [place] [address] [place], [place]  [address] [phone]

## 2013-11-17 NOTE — Telephone Encounter (Signed)
Pt aware of not qualifying for cardiac rehab

## 2013-11-17 NOTE — Telephone Encounter (Signed)
-----   Message from Burtis Junes, NP sent at 11/17/2013 12:41 PM EST ----- Will you call Mr. Filtz and let him know that he does not qualify for cardiac rehab - as we suspected - but at least we know.  Cecille Rubin ----- Message -----    From: Magda Kiel, RN    Sent: 11/17/2013  11:55 AM      To: Burtis Junes, NP  Hello Cecille Rubin,  Mr Abney would not be able to participate in the phase 2 program. He can come to the non monitored maintenance program if he would like.  Thanks  Let me know if he wants to participate.   Verdis Frederickson   ----- Message -----    From: Burtis Junes, NP    Sent: 11/17/2013  10:18 AM      To: Magda Kiel, RN  Good morning Verdis Frederickson  I am seeing Taisean this morning. He has had a recent cardiac ablation.   He is asking about returning to cardiac rehab again - I told him I did not feel like it would be covered, but we would ask. Your advice?  Thanks  Cecille Rubin

## 2013-11-17 NOTE — Patient Instructions (Addendum)
Stay on your current medicines  Keep a check on your blood pressure for [place].   See Dr. Acie Fredrickson back as planned in January  Call the [place] office at [phone] if you have any questions, problems or concerns.

## 2013-11-20 ENCOUNTER — Ambulatory Visit (INDEPENDENT_AMBULATORY_CARE_PROVIDER_SITE_OTHER): Payer: Medicare Other | Admitting: Thoracic Surgery (Cardiothoracic Vascular Surgery)

## 2013-11-20 ENCOUNTER — Ambulatory Visit
Admission: RE | Admit: 2013-11-20 | Discharge: 2013-11-20 | Disposition: A | Discharge status: Home / Self Care | Payer: Medicare Other | Source: Ambulatory Visit | Attending: Thoracic Surgery (Cardiothoracic Vascular Surgery) | Admitting: Thoracic Surgery (Cardiothoracic Vascular Surgery)

## 2013-11-20 ENCOUNTER — Encounter: Payer: Self-pay | Admitting: Thoracic Surgery (Cardiothoracic Vascular Surgery)

## 2013-11-20 VITALS — BP 148/83 | HR 77 | Resp 16 | Ht 71.5 in | Wt 179.5 lb

## 2013-11-20 DIAGNOSIS — Z951 Presence of aortocoronary bypass graft: Secondary | ICD-10-CM

## 2013-11-20 DIAGNOSIS — Z9889 Other specified postprocedural states: Secondary | ICD-10-CM

## 2013-11-20 DIAGNOSIS — Z8679 Personal history of other diseases of the circulatory system: Secondary | ICD-10-CM

## 2013-11-20 DIAGNOSIS — I2510 Atherosclerotic heart disease of native coronary artery without angina pectoris: Secondary | ICD-10-CM

## 2013-11-20 DIAGNOSIS — I4819 Other persistent atrial fibrillation: Secondary | ICD-10-CM

## 2013-11-20 DIAGNOSIS — I481 Persistent atrial fibrillation: Secondary | ICD-10-CM

## 2013-11-20 NOTE — Progress Notes (Signed)
[address]       [place] [address]             [phone]     CARDIOTHORACIC SURGERY OFFICE NOTE  Referring Provider is Thompson Grayer, MD Primary Cardiologist is Nahser, Wonda Cheng, MD PCP is Alesia Richards, MD   HPI:  Patient returns for followup status post coronary artery bypass grafting x2 and Maze procedure on 04/05/2013. His early postoperative course in the hospital was uncomplicated although he did go back into rate-controlled atrial flutter. He later developed shortness of breath, orthopnea, and the left pleural effusion associated with acute exacerbation of chronic diastolic congestive heart failure. He has long-standing hypertension with severe diastolic dysfunction.  He underwent left thoracentesis for his pleural effusion in early May yielding 1.2 L of fluid and he spontaneously converted to sinus rhythm prior to his appointment with Dr. Acie Fredrickson on 05/26/2013. Over the next 3 months the patient did quite well. He participated actively in the cardiac rehabilitation program and made good progress. He was last seen here in our office on 07/10/2013 at which time he was maintaining sinus rhythm and doing quite well. However, he subsequently developed atrial flutter which was associated with acute exacerbation of chronic diastolic congestive heart failure. He was hospitalized briefly in August, during which time he underwent DC cardioversion. He initially felt better again after cardioversion, but within a few weeks he began to experience worsening symptoms of exertional shortness of breath. He was last seen here in our office on 10/16/2013. At that time he remained in rate controlled atrial flutter with ongoing symptoms of chronic diastolic congestive heart failure.  He was referred back to Dr. Joylene Grapes who subsequently performed a comprehensive EP study with successful ablation of atypical left sided atrial flutter on 10/26/2013.  Since then he has maintained  sinus rhythm and done remarkably well. He has been seen in follow-up by Truitt Merle at [facility] on several occasions, most recently 11/17/2013.  He remains chronically anticoagulated on warfarin. He has been taken off of amiodarone. He returns to our office today reporting that he feels much better than he has since last December. He denies any symptoms of shortness of breath. His weight has continued to gradually trend down, and at this point he states that he feels as though he may be somewhat dehydrated. He is not having any tachycardia palpitations. His exercise tolerance is quite good. He reports no complaints.   Current Outpatient Prescriptions  Medication Sig Dispense Refill  . acetaminophen (TYLENOL) 500 MG tablet Take 1,000 mg by mouth every 6 (six) hours as needed for mild pain.     [name] ALPRAZolam (XANAX) 1 MG tablet Take 1 mg by mouth at bedtime as needed for anxiety or sleep.    [name] atorvastatin (LIPITOR) 20 MG tablet Take 20 mg by mouth 3 (three) times a week. Monday Wednesday and Friday    . BESIVANCE 0.6 % SUSP Place 1 drop into the left eye 4 (four) times daily as needed (after injection).     . cloNIDine (CATAPRES) 0.2 MG tablet Take 0.2 mg by mouth 2 (two) times daily.     . diphenhydrAMINE (BENADRYL) 25 MG tablet Take 25 mg by mouth every 6 (six) hours as needed for allergies.     . furosemide (LASIX) 40 MG tablet Take 2 tablets (80 mg total) by mouth 2 (two) times daily. 120 tablet 1  . glimepiride (AMARYL) 2 MG tablet Take 2 mg  by mouth daily with breakfast. If am fasting blood glucose is less than 120 hold amaryl dose.  For fasting blood glucose over 120 take 1/2 amaryl (1mg ).    . HYDROcodone-acetaminophen (VICODIN) 5-500 MG per tablet Take 1 tablet by mouth every 6 (six) hours as needed for pain.    . metFORMIN (GLUCOPHAGE-XR) 500 MG 24 hr tablet Take 1,000 mg by mouth 2 (two) times daily before a meal.     . potassium chloride SA (K-DUR,KLOR-CON) 20 MEQ tablet Take 2  tablets (40 mEq total) by mouth 2 (two) times daily. 120 tablet 1  . ranitidine (ZANTAC) 150 MG tablet Take 150 mg by mouth daily as needed for heartburn.     . sertraline (ZOLOFT) 100 MG tablet Take 100 mg by mouth daily.    . valsartan (DIOVAN) 160 MG tablet Take 160 mg by mouth daily.    [name] warfarin (COUMADIN) 5 MG tablet Take 7.5 mg by mouth daily. On Sunday 10 mg  Rest of the rest of week 7.5 mg     No current facility-administered medications for this visit.      Physical Exam:   BP 148/83 mmHg  Pulse 77  Resp 16  Ht 5' 11.5" (1.816 m)  Wt 179 lb 8 oz (81.421 kg)  BMI 24.69 kg/m2  SpO2 97%  General:  Well-appearing  Chest:   Clear with symmetrical breath sounds  CV:   Regular rate and rhythm without murmur  Incisions:  Completely healed, sternum is stable  Abdomen:  Soft and nontender  Extremities:  Warm and well-perfused  Diagnostic Tests:  2 channel telemetry rhythm strip demonstrates normal sinus rhythm    CHEST 2 VIEW  COMPARISON: Several prior exams most recent 10/30/2013.  FINDINGS: Post atrial clipping and CABG with heart size top-normal.  Interval partial clearing of left-sided pleural effusion. Small pleural effusion remains.  Central pulmonary vascular prominence.  No segmental consolidation or pneumothorax.  Calcified tortuous aorta.  IMPRESSION: Interval improvement with partial clearing of left-sided pleural effusion. Small left-sided pleural effusion remains.  Decrease in degree of pulmonary vascular congestion.  Please see above.   Electronically Signed  By: Chauncey Cruel M.D.  On: 11/20/2013 13:47   Impression:  Patient is doing very well and maintaining sinus rhythm since he underwent ablation for atypical left-sided atrial flutter by Dr. Rayann Heman last month. Symptoms of chronic diastolic congestive heart failure have dramatically improved and the patient's left-sided pleural effusion has nearly completely  resolved.    Plan:  We have not made any changes to the patient's current medications at this time. It might be reasonable to consider tapering his dose of diuretics to some degree, although I suspect that he may need to remain on at least a small dose of diuretic indefinitely. At this point I encouraged the patient to continue to increase his physical activity without any particular limitations. He will continue to follow-up with Dr. Acie Fredrickson, Dr. Rayann Heman, and other members of the team at [facility] for further adjustment to his long-term medical therapy. He will return to our office for routine follow-up and rhythm check in 6 months.  I spent in excess of 15 minutes during the conduct of this office consultation and >50% of this time involved direct face-to-face encounter with the patient for counseling and/or coordination of their care.  Valentina Gu. Roxy Manns, MD 11/20/2013 2:29 PM

## 2013-11-21 ENCOUNTER — Encounter (INDEPENDENT_AMBULATORY_CARE_PROVIDER_SITE_OTHER): Payer: Medicare Other | Admitting: Ophthalmology

## 2013-11-21 DIAGNOSIS — H338 Other retinal detachments: Secondary | ICD-10-CM

## 2013-11-21 DIAGNOSIS — H34832 Tributary (branch) retinal vein occlusion, left eye: Secondary | ICD-10-CM

## 2013-11-21 DIAGNOSIS — I10 Essential (primary) hypertension: Secondary | ICD-10-CM

## 2013-11-21 DIAGNOSIS — H43813 Vitreous degeneration, bilateral: Secondary | ICD-10-CM

## 2013-11-21 DIAGNOSIS — H35033 Hypertensive retinopathy, bilateral: Secondary | ICD-10-CM

## 2013-11-27 ENCOUNTER — Ambulatory Visit (INDEPENDENT_AMBULATORY_CARE_PROVIDER_SITE_OTHER): Payer: Medicare Other | Admitting: Internal Medicine

## 2013-11-27 ENCOUNTER — Encounter: Payer: Self-pay | Admitting: Internal Medicine

## 2013-11-27 VITALS — BP 126/80 | HR 68 | Temp 98.2°F | Resp 18 | Wt 192.2 lb

## 2013-11-27 DIAGNOSIS — I480 Paroxysmal atrial fibrillation: Secondary | ICD-10-CM

## 2013-11-27 DIAGNOSIS — Z7901 Long term (current) use of anticoagulants: Secondary | ICD-10-CM

## 2013-11-27 DIAGNOSIS — I4892 Unspecified atrial flutter: Secondary | ICD-10-CM

## 2013-11-27 NOTE — Progress Notes (Signed)
Patient ID: [name], male   DOB: 21-Mar-1940, 73 y.o.   MRN: [id number]  L A B     O N L Y

## 2013-11-28 DIAGNOSIS — Z9889 Other specified postprocedural states: Secondary | ICD-10-CM

## 2013-11-28 DIAGNOSIS — Z951 Presence of aortocoronary bypass graft: Secondary | ICD-10-CM

## 2013-11-28 DIAGNOSIS — I5032 Chronic diastolic (congestive) heart failure: Secondary | ICD-10-CM

## 2013-11-28 DIAGNOSIS — R0600 Dyspnea, unspecified: Secondary | ICD-10-CM

## 2013-11-28 DIAGNOSIS — I10 Essential (primary) hypertension: Secondary | ICD-10-CM

## 2013-11-28 LAB — PROTIME-INR
INR: 2.2 — ABNORMAL HIGH (ref <1.50)
Prothrombin Time: 24.4 seconds — ABNORMAL HIGH (ref 11.6–15.2)

## 2013-12-03 ENCOUNTER — Other Ambulatory Visit: Payer: Self-pay | Admitting: Physician Assistant

## 2013-12-05 ENCOUNTER — Other Ambulatory Visit: Payer: Self-pay | Admitting: *Deleted

## 2013-12-05 ENCOUNTER — Ambulatory Visit (INDEPENDENT_AMBULATORY_CARE_PROVIDER_SITE_OTHER): Payer: Medicare Other | Admitting: Internal Medicine

## 2013-12-05 ENCOUNTER — Encounter: Payer: Self-pay | Admitting: Internal Medicine

## 2013-12-05 VITALS — BP 142/80 | HR 80 | Temp 98.8°F | Resp 18 | Ht 71.0 in | Wt 191.0 lb

## 2013-12-05 DIAGNOSIS — Z7901 Long term (current) use of anticoagulants: Secondary | ICD-10-CM

## 2013-12-05 DIAGNOSIS — Z951 Presence of aortocoronary bypass graft: Secondary | ICD-10-CM

## 2013-12-05 DIAGNOSIS — I10 Essential (primary) hypertension: Secondary | ICD-10-CM

## 2013-12-05 DIAGNOSIS — I480 Paroxysmal atrial fibrillation: Secondary | ICD-10-CM

## 2013-12-05 MED ORDER — CLONIDINE HCL 0.2 MG PO TABS: 0.2000 mg | ORAL_TABLET | Freq: Three times a day (TID) | ORAL | Status: DC

## 2013-12-05 NOTE — Progress Notes (Signed)
Subjective:    Patient ID: [name], male    DOB: 1940/10/02, 73 y.o.   MRN: [id number]  HPI  Very nice 73 yo MWM w/ HTN, HLD,T2DM, ASCAD/CABG& MAZE (04/2013), hx/o pAtrial arrhythmias & on chronic anti-coagulation, who most recently had RFA for AFlutter on Oct 22  And presents today for f/u of recent visit with PT/INR 2.33 therapeutic. CV systems review is negative.  Medication Sig  . acetaminophen (TYLENOL) 500 MG tablet Take 1,000 mg by mouth every 6 (six) hours as needed for mild pain.   [name] ALPRAZolam (XANAX) 1 MG tablet Take 1 mg by mouth at bedtime as needed for anxiety or sleep.  [name] atorvastatin (LIPITOR) 20 MG tablet Take 20 mg by mouth 3 (three) times a week. Monday Wednesday and Friday  . BESIVANCE 0.6 % SUSP Place 1 drop into the left eye 4 (four) times daily as needed (after injection).   . cloNIDine (CATAPRES) 0.2 MG tablet Take 1 tablet (0.2 mg total) by mouth 3 (three) times daily.  . diphenhydrAMINE (BENADRYL) 25 MG tablet Take 25 mg by mouth every 6 (six) hours as needed for allergies.   . furosemide (LASIX) 40 MG tablet Take 2 tablets (80 mg total) by mouth 2 (two) times daily.  [name] glimepiride (AMARYL) 2 MG tablet Take 2 mg by mouth daily with breakfast. If am fasting blood glucose is less than 120 hold amaryl dose.  For fasting blood glucose over 120 take 1/2 amaryl (1mg ).  . metFORMIN (GLUCOPHAGE-XR) 500 MG 24 hr tablet Take 1,000 mg by mouth 2 (two) times daily before a meal.   . potassium chloride SA (K-DUR,KLOR-CON) 20 MEQ tablet Take 2 tablets (40 mEq total) by mouth 2 (two) times daily.  . ranitidine (ZANTAC) 150 MG tablet Take 150 mg by mouth daily as needed for heartburn.   . sertraline (ZOLOFT) 100 MG tablet TAKE 1 TABLET BY MOUTH DAILY  . valsartan (DIOVAN) 160 MG tablet Take 160 mg by mouth daily.  [name] warfarin (COUMADIN) 5 MG tablet Take 7.5 mg by mouth daily. On Sunday 10 mg  Rest of the rest of week 7.5 mg  . HYDROcodone-acetaminophen (VICODIN) 5-500 MG per tablet  Take 1 tablet by mouth every 6 (six) hours as needed for pain.   Allergies  Allergen Reactions  . Sunflower Seed [Sunflower Oil] Swelling and Other (See Comments)    Tongue and lip swelling  . Horse-Derived Products Other (See Comments)    Based on skin test reaction  . Other Other (See Comments)    Tetanus Shot -- skin test reaction   Past Medical History  Diagnosis Date  . Hx of adenomatous colonic polyps   . Diabetes mellitus type II   . Hypertension   . Diverticulosis 2001  . Hyperlipidemia   . Persistent atrial fibrillation     a. s/p MAZE 04/2013 in setting of CABG. b. Amio stopped in 10/2013 after flutter ablation.  . Obstructive sleep apnea     compliant with CPAP  . Chronic diastolic congestive heart failure   . Hypertensive cardiomyopathy   . History of cardioversion     x3 (years uncertain)  . H/O hiatal hernia   . Basal cell carcinoma   . Adrenal adenoma   . CAD (coronary artery disease)     a. 04/2013 CABG x 2: LIMA to LAD, SVG to RI, EVH via R thigh.  . S/P Maze operation for atrial fibrillation     a. 04/2013: Complete bilateral atrial  lesion set using cryothermy and bipolar radiofrequency ablation with clipping of LA appendage (@ time of CABG)  . GERD (gastroesophageal reflux disease)   . Depression   . Anxiety   . DJD (degenerative joint disease)   . 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 DCCV 08/2013. b. s/p RFA 10/2013.  [name] Respiratory failure     a. Hypoxia 10/2013 - required supp O2 as inpatient, did not require it at discharge.  . Pleural effusion, left     a. s/p thoracentesis 05/2013.  [name] PFO (patent foramen ovale)     a. Small PFO by TEE 10/2013.   Review of Systems  In addition to the HPI above,  No Fever-chills,  No Headache, No changes with Vision or hearing,  No problems swallowing food or Liquids,  No Chest pain or productive Cough or Shortness of Breath,  No Abdominal pain, No Nausea or Vomitting, Bowel movements are regular,  No Blood in  stool or Urine,  No dysuria,  No new skin rashes or bruises,  No new joints pains-aches,  No new weakness, tingling, numbness in any extremity,  No recent weight loss,  No polyuria, polydypsia or polyphagia,  No significant Mental Stressors.  A full 10 point Review of Systems was done, except as stated above, all other Review of Systems were negative     Objective:   Physical Exam  BP 142/80  Pulse 80  Temp 98.8 F   Resp 18  Ht 5\' 11"    Wt 191 lb   BMI 26.65   HEENT - Eac's patent. TM's Nl. EOM's full. PERRLA. NasoOroPharynx clear. Neck - supple. Nl Thyroid. Carotids 2+ & No bruits, nodes, JVD Chest - Clear equal BS w/o Rales, rhonchi, wheezes. Cor - Nl HS. RRR w/o sig MGR. PP 1(+). No edema. MS- FROM w/o deformities. Muscle power, tone and bulk Nl. Gait Nl. Neuro - No obvious Cr N abnormalities. Sensory, motor and Cerebellar functions appear Nl w/o focal abnormalities. Psyche - Mental status normal & appropriate.      Assessment & Plan:   1. Hypertension  - controlled  2. PAFib / PAFlutter , hx/o  - s/p RFAblation 10/26/2013 (Dr Allred)  3. 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   4. Chronic anticoagulation  - Therapeutic

## 2013-12-14 ENCOUNTER — Ambulatory Visit (INDEPENDENT_AMBULATORY_CARE_PROVIDER_SITE_OTHER): Payer: Medicare Other | Admitting: Internal Medicine

## 2013-12-14 ENCOUNTER — Encounter (HOSPITAL_COMMUNITY): Payer: Self-pay | Admitting: Cardiovascular Disease

## 2013-12-14 VITALS — BP 156/82 | HR 64 | Temp 98.1°F | Resp 16 | Ht 71.0 in | Wt 194.6 lb

## 2013-12-14 DIAGNOSIS — M5030 Other cervical disc degeneration, unspecified cervical region: Secondary | ICD-10-CM

## 2013-12-14 DIAGNOSIS — M5412 Radiculopathy, cervical region: Secondary | ICD-10-CM

## 2013-12-14 MED ORDER — HYDROCODONE-ACETAMINOPHEN 5-325 MG PO TABS: 1.0000 | ORAL_TABLET | Freq: Four times a day (QID) | ORAL | Status: DC | PRN

## 2013-12-14 MED ORDER — PREDNISONE 20 MG PO TABS: ORAL_TABLET | ORAL | Status: DC

## 2013-12-14 NOTE — Progress Notes (Signed)
Subjective:    Patient ID: [name], male    DOB: 1940/06/12, 73 y.o.   MRN: [id number]  HPI Nice spry 73 yoMWM s/p recent CABG and MAZE procedure with more recent ablation fro recurrent aFlutter who now seems to be doing well with respect to that - presents with c/o neck pains. Has hx/o 10-15 yrs remote of (+) Cx MRI -> Cx DDD/HNP with Lt rCx radiculitis and evaluated by Dr Ellene Route with  Conservative mgmt and gradual recovery. Now relates 6 +/- mo progression of occipital /nuchal HA & Rt Cx radicular sx's radiating to the R scapular area and pain markedly increscendo over the last several weeks.  Medication Sig  . acetaminophen (TYLENOL) 500 MG tablet Take 1,000 mg by mouth every 6 (six) hours as needed for mild pain.   [name] ALPRAZolam (XANAX) 1 MG tablet Take 1 mg by mouth at bedtime as needed for anxiety or sleep.  [name] atorvastatin (LIPITOR) 20 MG tablet Take 20 mg by mouth 3 (three) times a week. Monday Wednesday and Friday  . BESIVANCE 0.6 % SUSP Place 1 drop into the left eye 4 (four) times daily as needed (after injection).   . cloNIDine (CATAPRES) 0.2 MG tablet Take 1 tablet (0.2 mg total) by mouth 3 (three) times daily.  . diphenhydrAMINE (BENADRYL) 25 MG tablet Take 25 mg by mouth every 6 (six) hours as needed for allergies.   . furosemide (LASIX) 40 MG tablet Take 2 tablets (80 mg total) by mouth 2 (two) times daily.  [name] glimepiride (AMARYL) 2 MG tablet Take 2 mg by mouth daily with breakfast. If am fasting blood glucose is less than 120 hold amaryl dose.  For fasting blood glucose over 120 take 1/2 amaryl (1mg ).  . HYDROcodone-acetaminophen (NORCO/VICODIN) 5-325 MG per tablet Take 1 tablet by mouth every 6 (six) hours as needed for moderate pain.  . metFORMIN (GLUCOPHAGE-XR) 500 MG 24 hr tablet Take 1,000 mg by mouth 2 (two) times daily before a meal.   . potassium chloride SA (K-DUR,KLOR-CON) 20 MEQ tablet Take 2 tablets (40 mEq total) by mouth 2 (two) times daily.  . ranitidine  (ZANTAC) 150 MG tablet Take 150 mg by mouth daily as needed for heartburn.   . sertraline (ZOLOFT) 100 MG tablet TAKE 1 TABLET BY MOUTH DAILY  . valsartan (DIOVAN) 160 MG tablet Take 160 mg by mouth daily.  [name] warfarin (COUMADIN) 5 MG tablet Take 7.5 mg by mouth daily. On Sunday 10 mg  Rest of the rest of week 7.5 mg   Allergies  Allergen Reactions  . Sunflower Seed [Sunflower Oil] Swelling and Other (See Comments)    Tongue and lip swelling  . Horse-Derived Products Other (See Comments)    Based on skin test reaction  . Other Other (See Comments)    Tetanus Shot -- skin test reaction   Past Medical History  Diagnosis Date  . Hx of adenomatous colonic polyps   . Diabetes mellitus type II   . Hypertension   . Diverticulosis 2001  . Hyperlipidemia   . Persistent atrial fibrillation     a. s/p MAZE 04/2013 in setting of CABG. b. Amio stopped in 10/2013 after flutter ablation.  . Obstructive sleep apnea     compliant with CPAP  . Chronic diastolic congestive heart failure   . Hypertensive cardiomyopathy   . History of cardioversion     x3 (years uncertain)  . H/O hiatal hernia   . Basal cell carcinoma   .  Adrenal adenoma   . CAD (coronary artery disease)     a. 04/2013 CABG x 2: LIMA to LAD, SVG to RI, EVH via R thigh.  . S/P Maze operation for atrial fibrillation     a. 04/2013: Complete bilateral atrial lesion set using cryothermy and bipolar radiofrequency ablation with clipping of LA appendage (@ time of CABG)  . GERD (gastroesophageal reflux disease)   . Depression   . Anxiety   . DJD (degenerative joint disease)   . 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 DCCV 08/2013. b. s/p RFA 10/2013.  [name] Respiratory failure     a. Hypoxia 10/2013 - required supp O2 as inpatient, did not require it at discharge.  . Pleural effusion, left     a. s/p thoracentesis 05/2013.  [name] PFO (patent foramen ovale)     a. Small PFO by TEE 10/2013.   Past Surgical History  Procedure Laterality Date   . Polinydal cyst      Removed  . Great toe arthrodesis, interphalangeal joint      Right foot  . Retina repair-right    . Cataract extraction      bilateral  . Basal cell carcinoma excision      x3 on face  . Polypectomy    . Cardiac catheterization      myocardial bridge but no cad  . Coronary artery bypass graft N/A 04/05/2013    Procedure: CORONARY ARTERY BYPASS GRAFTING (CABG) TIMES TWO USING LEFT INTERNAL MAMMARY ARTERY AND RIGHT SAPHENOUS LEG VEIN HARVESTED ENDOSCOPICALLY;  Surgeon: Rexene Alberts, MD;  Location: [place];  Service: Open Heart Surgery;  Laterality: N/A;  . Maze N/A 04/05/2013    Procedure: MAZE;  Surgeon: Rexene Alberts, MD;  Location: [place];  Service: Open Heart Surgery;  Laterality: N/A;  . Intraoperative transesophageal echocardiogram N/A 04/05/2013    Procedure: INTRAOPERATIVE TRANSESOPHAGEAL ECHOCARDIOGRAM;  Surgeon: Rexene Alberts, MD;  Location: [place];  Service: Open Heart Surgery;  Laterality: N/A;  . Tee without cardioversion N/A 08/23/2013    Procedure: TRANSESOPHAGEAL ECHOCARDIOGRAM (TEE);  Surgeon: Sanda Klein, MD;  Location: [place];  Service: Cardiovascular;  Laterality: N/A;  . Cardioversion N/A 08/23/2013    Procedure: CARDIOVERSION;  Surgeon: Sanda Klein, MD;  Location: [facility] ENDOSCOPY;  Service: Cardiovascular;  Laterality: N/A;  . Tee without cardioversion N/A 10/26/2013    Procedure: TRANSESOPHAGEAL ECHOCARDIOGRAM (TEE);  Surgeon: Sueanne Margarita, MD;  Location[facility] ENDOSCOPY;  Service: Cardiovascular;  Laterality: N/A;  . Left heart catheterization with coronary angiogram N/A 03/07/2013    Procedure: LEFT HEART CATHETERIZATION WITH CORONARY ANGIOGRAM;  Surgeon: Burnell Blanks, MD;  Location: [facility] CATH LAB;  Service: Cardiovascular;  Laterality: N/A;  . Atrial fibrillation ablation N/A 10/26/2013    Procedure: ATRIAL FIBRILLATION ABLATION;  Surgeon: Coralyn Mark, MD;  Location: [facility] CATH LAB;  Service: Cardiovascular;  Laterality: N/A;    Review of Systems (+) & pertinent as above  Objective:   Physical Exam  BP 156/82 mmHg  Pulse 64  Temp(Src) 98.1 F (36.7 C)  Resp 16  Ht 5\' 11"  (1.803 m)  Wt 194 lb 9.6 oz (88.27 kg)  BMI 27.15 kg/m2  HEENT - Unremarkable. Neck - sl decreased ROM in all positions with noted tender paracervical spasm. DTR's & sensorimotor testing of UE's is felt WNL.  Hest - Cear Cor - RRR w/o Sig MGR Neuro w/o focal abnormalities evident. Gait   Nl.  Assessment & Plan:   1.  DDD (degenerative disc disease), cervical   2. Cervical radiculitis  - predniSONE (DELTASONE) 20 MG tablet; 1 tab 3 x day for 3 days, then 1 tab 2 x day for 3 days, then 1 tab 1 x day for 5 days  Dispense: 20 tablet; Refill: 0 - HYDROcodone-acetaminophen (NORCO/VICODIN) 5-325 MG per tablet; Take 1 tablet by mouth every 6 (six) hours as needed for moderate pain.  Dispense: 30 tablet  - Discussed meds & SE's. - Advised if no improvement 8-10 days - call to schedule Cx MRI or sooner if developed any unusual neurological Sx's.

## 2013-12-18 ENCOUNTER — Encounter: Payer: Self-pay | Admitting: Cardiovascular Disease

## 2013-12-18 ENCOUNTER — Ambulatory Visit (INDEPENDENT_AMBULATORY_CARE_PROVIDER_SITE_OTHER): Payer: Medicare Other | Admitting: Cardiovascular Disease

## 2013-12-18 VITALS — BP 160/90 | HR 63 | Ht 71.0 in | Wt 186.8 lb

## 2013-12-18 DIAGNOSIS — I10 Essential (primary) hypertension: Secondary | ICD-10-CM

## 2013-12-18 DIAGNOSIS — I480 Paroxysmal atrial fibrillation: Secondary | ICD-10-CM

## 2013-12-18 MED ORDER — POTASSIUM CHLORIDE CRYS ER 20 MEQ PO TBCR: 20.0000 meq | EXTENDED_RELEASE_TABLET | Freq: Every day | ORAL | Status: DC

## 2013-12-18 MED ORDER — FUROSEMIDE 40 MG PO TABS: 80.0000 mg | ORAL_TABLET | Freq: Every day | ORAL | Status: DC

## 2013-12-18 MED ORDER — POTASSIUM CHLORIDE CRYS ER 20 MEQ PO TBCR: 40.0000 meq | EXTENDED_RELEASE_TABLET | Freq: Every day | ORAL | Status: DC

## 2013-12-18 NOTE — Assessment & Plan Note (Signed)
He's doing well after his A. fib ablation. He is maintaining sinus rhythm.

## 2013-12-18 NOTE — Patient Instructions (Addendum)
Your physician has recommended you make the following change in your medication:  Decrease Lasix to 80 mg once daily  Decrease Potassium to 40 meq once daily   Your physician wants you to follow-up in: 6 months with Dr. Acie Fredrickson. You will receive a reminder letter in the mail two months in advance. If you don't receive a letter, please call our office to schedule the follow-up appointment.

## 2013-12-18 NOTE — Assessment & Plan Note (Signed)
BP is elevated today but his readings are typically in the normal range.  He has not had his meds today.

## 2013-12-18 NOTE — Progress Notes (Signed)
[name] Date of Birth  [birth year] [facility] [address]    [place], [place]  [address] [phone]  Fax  [phone]  Problem list: 1. Hypertension 2. HOCM 3. Aortic insuffuciency 4. Atrial fibrillation-failed Tikosyn,   Has seen [name] in [place], [place] .   5. Diabetes mellitus 6. 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 He has a hyperdynamic LV with systolic anterior motion of the mitral valve leaflet. 8. CABG and MAZE 04-05-13  History of Present Illness:  [name] is a 73 year old gentleman with a history of hypertension, atrial fibrillation and a myocardial bridge.  He is a previous patient of Dr. Susa [name]. He also has a history of diabetes. He has felt fairly well. He has not had any episodes of chest pain or shortness of breath.    Nov. 25, 2013: He is very symptomatic with his Atrial fib.  He gets very winded with walking.  He has been on Eliquis and tikosyn 500 BID.  He had an episode of severe dyspnea while at [place] in [place].  He was diagnosed with panic attack.  Enzymes were normal.  He is ready for his cardioversion.  Nov. 27, 2013: [name] was seen 2 days ago and was found to have atrial fibrillation. He was scheduled for cardioversion. When he showed up for cardioversion yesterday, he was in normal sinus rhythm.  He related to me that he had some medication changes over the past several weeks. He was previously on Tikosyn and citalopram and his atrial fibrillation seemed to be well-controlled on that.  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  It was at that time that he started having increased palpitations, anxiety, and we noted him to be in atrial fibrillation. He then discontinued the Wellbutrin and was started on Zoloft.  He seems to be tolerating this combination. It was at that time that he showed up in the short  stay Center and was found to have already converted to sinus rhythm.  May 31, 2012:  [name] is doing well.  Still in NSR.  No leg edema.  He sticks to a low sodium diet.   He has occasional CP - typically associated with arm / chest movement.   He wants to stop coumadin - interferes with his arthritis medications.  He has maintained NSR since his last visit.    Nov. 24, 2014:  [name] is doing OK.  He has some brief palps - he thinks they are PVCs.  No sustained episodes of Afib.   He has been getting Avastin injections into his eyes.    Jan. 12, 2015:  [name] is seen today as a work in visit for worsening ankle edema.   He has not been able to associate his symptoms with change of diet.  .  he has also been noticing some increasing shortness of breath.  He notes short or shortness of breath with any activity-example take the garbage cans out.   He denies any PND. He uses a CPAP mask.    Jan. 26, 2015:  [name] is seen back today . He had CABG and MAZE on April 05, 2013 - slow recovery following surgery Had a left pleural tap ~ 4 weeks after surgery - 1.2 liters taken off - did much better after that  Had elective cardioversioin in early August, 2015.   [name] was seen 2  weeks ago with symptoms consistent with congestive heart failure. He had been in persistent atrial fibrillation and so his tikosyn  was stopped.  He is still in  atrial fibrillation. His rate is well controlled.     He had an echocardiogram: Left ventricle: Wall thickness was increased in a pattern of moderate LVH. Systolic function was vigorous. The estimated ejection fraction was in the range of 75% to 80%. Wall motion was normal; there were no regional wall motion abnormalities. - Aortic valve: Trivial regurgitation. - Left atrium: The atrium was mildly to moderately dilated. - Right ventricle: The cavity size was mildly dilated. Systolic function was mildly reduced. - Right atrium: The atrium was moderately dilated. -  Pulmonary arteries: Systolic pressure was mildly increased. PA peak pressure: 68mm Hg  He was found to have mild pulmonary hypertension.   He continues to have some dyspnea.  He has ankle edema.  His BP log shows mildly elevated BP.  May 26, 2013: [name] has had 2 V CABG and MAZE  (April 05, 2013)  since I last saw him.   . He has done well He is having some tenderness along the right thigh SVG harvest site.  Also c/o left 4th and 5th finger tingling / numbness.    He has otherwise done well.  No cardiac complaints.    August 30, 2013:  He was cardioverted several weeks ago.  After the cardioversion, his metoprolol was doubled to 25 BID, and diltizem was added   Oct. 2, 2015:  He has gone back into atrial fib.  He has known left pleural effusion. He has had more dyspnea.  Saw [name] yesterday.  He is more lethargic.  Has hypoxemia ( O2 sat of 88% with walking 50 feet)    Dec. 14, 2015:  [name] is followed for atrial fib.  He has had a MAZE procedure which was unsuccessful.  He had an A-fib ablation which has helped.  No A-fib. Swelling has resolved. Dyspnea has resolved.   Doing well from a cardiac standpoint  Needs to have right thumb surgery. Also has bulging discs in his neck with pain radiation down his right arm/ shoulder   Current Outpatient Prescriptions on File Prior to Visit  Medication Sig Dispense Refill  . acetaminophen (TYLENOL) 500 MG tablet Take 1,000 mg by mouth every 6 (six) hours as needed for mild pain.     [name] ALPRAZolam (XANAX) 1 MG tablet Take 1 mg by mouth at bedtime as needed for anxiety or sleep.    [name] atorvastatin (LIPITOR) 20 MG tablet Take 20 mg by mouth 3 (three) times a week. Monday Wednesday and Friday    . B Complex Vitamins (VITAMIN B COMPLEX PO) Take 1 tablet by mouth daily.    [name] BESIVANCE 0.6 % SUSP Place 1 drop into the left eye 4 (four) times daily as needed (after injection).     . Cholecalciferol (VITAMIN D PO) Take 5,000 Units by mouth daily.     . cloNIDine (CATAPRES) 0.2 MG tablet Take 1 tablet (0.2 mg total) by mouth 3 (three) times daily. 270 tablet 0  . diphenhydrAMINE (BENADRYL) 25 MG tablet Take 25 mg by mouth every 6 (six) hours as needed for allergies.     . furosemide (LASIX) 40 MG tablet Take 2 tablets (80 mg total) by mouth 2 (two) times daily. 120 tablet 1  . glimepiride (AMARYL) 2 MG tablet Take 2 mg by mouth daily with breakfast. If am fasting blood  glucose is less than 120 hold amaryl dose.  For fasting blood glucose over 120 take 1/2 amaryl (1mg ).    . HYDROcodone-acetaminophen (NORCO/VICODIN) 5-325 MG per tablet Take 1 tablet by mouth every 6 (six) hours as needed for moderate pain. 30 tablet   . metFORMIN (GLUCOPHAGE-XR) 500 MG 24 hr tablet Take 1,000 mg by mouth 2 (two) times daily before a meal.     . Multiple Vitamin (MULTIVITAMIN) tablet Take 1 tablet by mouth daily.    . potassium chloride SA (K-DUR,KLOR-CON) 20 MEQ tablet Take 2 tablets (40 mEq total) by mouth 2 (two) times daily. 120 tablet 1  . predniSONE (DELTASONE) 20 MG tablet 1 tab 3 x day for 3 days, then 1 tab 2 x day for 3 days, then 1 tab 1 x day for 5 days 20 tablet 0  . ranitidine (ZANTAC) 150 MG tablet Take 150 mg by mouth daily as needed for heartburn.     . sertraline (ZOLOFT) 100 MG tablet TAKE 1 TABLET BY MOUTH DAILY 30 tablet 11  . valsartan (DIOVAN) 160 MG tablet Take 160 mg by mouth daily.    [name] warfarin (COUMADIN) 5 MG tablet Take 7.5 mg by mouth daily. On Sunday 10 mg  Rest of the rest of week 7.5 mg     No current facility-administered medications on file prior to visit.    Allergies  Allergen Reactions  . Sunflower Seed [Sunflower Oil] Swelling and Other (See Comments)    Tongue and lip swelling  . Horse-Derived Products Other (See Comments)    Based on skin test reaction  . Other Other (See Comments)    Tetanus Shot -- skin test reaction    Past Medical History  Diagnosis Date  . Hx of adenomatous colonic polyps   . Diabetes  mellitus type II   . Hypertension   . Diverticulosis 2001  . Hyperlipidemia   . Persistent atrial fibrillation     a. s/p MAZE 04/2013 in setting of CABG. b. Amio stopped in 10/2013 after flutter ablation.  . Obstructive sleep apnea     compliant with CPAP  . Chronic diastolic congestive heart failure   . Hypertensive cardiomyopathy   . History of cardioversion     x3 (years uncertain)  . H/O hiatal hernia   . Basal cell carcinoma   . Adrenal adenoma   . CAD (coronary artery disease)     a. 04/2013 CABG x 2: LIMA to LAD, SVG to RI, EVH via R thigh.  . S/P Maze operation for atrial fibrillation     a. 04/2013: Complete bilateral atrial lesion set using cryothermy and bipolar radiofrequency ablation with clipping of LA appendage (@ time of CABG)  . GERD (gastroesophageal reflux disease)   . Depression   . Anxiety   . DJD (degenerative joint disease)   . 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 DCCV 08/2013. b. s/p RFA 10/2013.  [name] Respiratory failure     a. Hypoxia 10/2013 - required supp O2 as inpatient, did not require it at discharge.  . Pleural effusion, left     a. s/p thoracentesis 05/2013.  [name] PFO (patent foramen ovale)     a. Small PFO by TEE 10/2013.    Past Surgical History  Procedure Laterality Date  . Polinydal cyst      Removed  . Great toe arthrodesis, interphalangeal joint      Right foot  . Retina repair-right    . Cataract extraction  bilateral  . Basal cell carcinoma excision      x3 on face  . Polypectomy    . Cardiac catheterization      myocardial bridge but no cad  . Coronary artery bypass graft N/A 04/05/2013    Procedure: CORONARY ARTERY BYPASS GRAFTING (CABG) TIMES TWO USING LEFT INTERNAL MAMMARY ARTERY AND RIGHT SAPHENOUS LEG VEIN HARVESTED ENDOSCOPICALLY;  Surgeon: Rexene Alberts, MD;  Location: [facility];  Service: Open Heart Surgery;  Laterality: N/A;  . Maze N/A 04/05/2013    Procedure: MAZE;  Surgeon: Rexene Alberts, MD;  Location: [facility];   Service: Open Heart Surgery;  Laterality: N/A;  . Intraoperative transesophageal echocardiogram N/A 04/05/2013    Procedure: INTRAOPERATIVE TRANSESOPHAGEAL ECHOCARDIOGRAM;  Surgeon: Rexene Alberts, MD;  Location: [facility];  Service: Open Heart Surgery;  Laterality: N/A;  . Tee without cardioversion N/A 08/23/2013    Procedure: TRANSESOPHAGEAL ECHOCARDIOGRAM (TEE);  Surgeon: Sanda Klein, MD;  Location: [facility];  Service: Cardiovascular;  Laterality: N/A;  . Cardioversion N/A 08/23/2013    Procedure: CARDIOVERSION;  Surgeon: Sanda Klein, MD;  Location: [facility] ENDOSCOPY;  Service: Cardiovascular;  Laterality: N/A;  . Tee without cardioversion N/A 10/26/2013    Procedure: TRANSESOPHAGEAL ECHOCARDIOGRAM (TEE);  Surgeon: Sueanne Margarita, MD;  Location: [facility] ENDOSCOPY;  Service: Cardiovascular;  Laterality: N/A;  . Left heart catheterization with coronary angiogram N/A 03/07/2013    Procedure: LEFT HEART CATHETERIZATION WITH CORONARY ANGIOGRAM;  Surgeon: Burnell Blanks, MD;  Location: [facility] CATH LAB;  Service: Cardiovascular;  Laterality: N/A;  . Atrial fibrillation ablation N/A 10/26/2013    Procedure: ATRIAL FIBRILLATION ABLATION;  Surgeon: Coralyn Mark, MD;  Location: [facility] CATH LAB;  Service: Cardiovascular;  Laterality: N/A;    History  Smoking status  . Former Smoker -- 4.00 packs/day for 25 years  . Types: Cigarettes  . Quit date: 01/05/1981  Smokeless tobacco  . Never Used    History  Alcohol Use  . Yes    Comment: 1-5 drinks per week    Family History  Problem Relation Age of Onset  . Colon cancer Mother     Family History/Uncle   . Colon polyps Mother     Family History  . Atrial fibrillation Mother   . Hypertension Mother   . Colon polyps Sister     Family history  . Diabetes Maternal Uncle   . Stroke Paternal Uncle   . Dementia Father     Reviw of Systems:  Reviewed in the HPI.  All other systems are negative.  Physical Exam: BP 160/90 mmHg  Pulse 63  Ht 5\' 11"   (1.803 m)  Wt 186 lb 12 oz (84.709 kg)  BMI 26.06 kg/m2 The patient is alert and oriented x 3.  The mood and affect are normal.   Skin: warm and dry.  Color is normal.    HEENT:   the sclera are nonicteric.  The mucous membranes are moist.  The carotids are 2+ without bruits.  There is no thyromegaly.  There is no JVD.   Lungs: clear.  The chest wall is non tender.   Heart: RR.   with a normal S1 and S2.  He has a hyperdynamic PMI. He has a very soft but brief 2/6 systolic murmur at the left cardiac  border. 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 Abdomen: good bowel sounds.  There is no guarding or rebound.  There is no hepatosplenomegaly or tenderness.  There are no masses.  Extremities:  no clubbing, cyanosis, or edema.  The legs are without rashes.  The distal pulses are intact.  Neuro:  Cranial nerves II - XII are intact.  Motor and sensory functions are intact.   The gait is normal.  ECG: Dec. 14, 2015:  Sinus rhythm as 63.  RVH.   Assessment / Plan:

## 2013-12-18 NOTE — Assessment & Plan Note (Signed)
[name] seems to be doing well. His diastolic dysfunction seems to be much better now that he is in sinus rhythm. We will reduce his Lasix to 80 mg once a day and his potassium chloride to 20 mEq once a day. He will call us if he has recurrent any recurrent episodes of swelling.

## 2013-12-19 ENCOUNTER — Telehealth: Payer: Self-pay | Admitting: Cardiovascular Disease

## 2013-12-19 NOTE — Telephone Encounter (Signed)
Left message for call back. Need MD name, fax number,location of surgery, and type of anesthesia.

## 2013-12-19 NOTE — Telephone Encounter (Signed)
Spoke with Judson Roch at [place] office and advised that Dr.Nahser advised that the patient is at low risk for CV complication with thumb surgery. Office note and handwritten addition faxed to [phone].

## 2013-12-19 NOTE — Telephone Encounter (Addendum)
Lyn called back with Dr.Weingold. Thumb surgery scheduled for 12/16/ general anesthesia at the day surgical center and will take about one hour. Looking for surgical clearance to be faxed to [phone]. Advised will ask Dr.Nahser and call her back.

## 2013-12-19 NOTE — Telephone Encounter (Signed)
New message      Pt was seen yesterday---need clearance to have hand surgery.  Please call and give the ok.  Surgery is scheduled for tomorrow.

## 2013-12-20 ENCOUNTER — Encounter (HOSPITAL_BASED_OUTPATIENT_CLINIC_OR_DEPARTMENT_OTHER): Payer: Self-pay | Admitting: Certified Registered"

## 2013-12-25 ENCOUNTER — Other Ambulatory Visit: Payer: Self-pay | Admitting: Internal Medicine

## 2013-12-29 ENCOUNTER — Encounter (HOSPITAL_BASED_OUTPATIENT_CLINIC_OR_DEPARTMENT_OTHER): Admission: RE | Payer: Self-pay | Source: Ambulatory Visit

## 2013-12-29 SURGERY — CARPOMETACARPEL (CMC) SUSPENSION PLASTY
Anesthesia: General | Laterality: Left

## 2014-01-02 ENCOUNTER — Encounter (INDEPENDENT_AMBULATORY_CARE_PROVIDER_SITE_OTHER): Payer: Medicare Other | Admitting: Ophthalmology

## 2014-01-02 DIAGNOSIS — I10 Essential (primary) hypertension: Secondary | ICD-10-CM

## 2014-01-02 DIAGNOSIS — H35033 Hypertensive retinopathy, bilateral: Secondary | ICD-10-CM

## 2014-01-02 DIAGNOSIS — H34832 Tributary (branch) retinal vein occlusion, left eye: Secondary | ICD-10-CM

## 2014-01-02 DIAGNOSIS — H43813 Vitreous degeneration, bilateral: Secondary | ICD-10-CM

## 2014-01-02 DIAGNOSIS — H338 Other retinal detachments: Secondary | ICD-10-CM

## 2014-01-03 ENCOUNTER — Encounter: Payer: Self-pay | Admitting: Cardiovascular Disease

## 2014-01-03 ENCOUNTER — Ambulatory Visit (INDEPENDENT_AMBULATORY_CARE_PROVIDER_SITE_OTHER): Payer: Medicare Other | Admitting: Cardiovascular Disease

## 2014-01-03 ENCOUNTER — Ambulatory Visit: Payer: Self-pay | Admitting: Internal Medicine

## 2014-01-03 VITALS — BP 170/92 | HR 62 | Ht 71.0 in | Wt 188.8 lb

## 2014-01-03 DIAGNOSIS — I2510 Atherosclerotic heart disease of native coronary artery without angina pectoris: Secondary | ICD-10-CM

## 2014-01-03 DIAGNOSIS — I10 Essential (primary) hypertension: Secondary | ICD-10-CM

## 2014-01-03 DIAGNOSIS — I480 Paroxysmal atrial fibrillation: Secondary | ICD-10-CM

## 2014-01-03 LAB — PROTIME-INR
INR: 2 ratio — AB (ref 0.8–1.0)
PROTHROMBIN TIME: 21.3 s — AB (ref 9.6–13.1)

## 2014-01-03 MED ORDER — VALSARTAN 320 MG PO TABS: 320.0000 mg | ORAL_TABLET | Freq: Every day | ORAL | Status: DC

## 2014-01-03 NOTE — Assessment & Plan Note (Signed)
Continue coumadin Will check INR today. Rhythm is stable.

## 2014-01-03 NOTE — Assessment & Plan Note (Signed)
Bp is elevated. He has not had his lasix for 2 days. And also has been on predinisone Will increase diovan to 320 a day. Will see him in 3 months.

## 2014-01-03 NOTE — Patient Instructions (Signed)
Your physician recommends that you return for lab work in: TODAY (INR)  Your physician has recommended you make the following change in your medication:  1) INCREASE Diovan to 320mg  daily  Your physician recommends that you schedule a follow-up appointment in: 3 months with Dr. Acie Fredrickson

## 2014-01-03 NOTE — Progress Notes (Signed)
[name] Date of Birth  [birth year] [facility] [address]    [name], [place]  [address] [phone]  Fax  [phone]  Problem list: 1. Hypertension 2. HOCM 3. Aortic insuffuciency 4. Atrial fibrillation-failed Tikosyn,   Has seen [facility] in [place], [place] .   5. Diabetes mellitus 6. 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 He has a hyperdynamic LV with systolic anterior motion of the mitral valve leaflet. 8. CABG and MAZE 04-05-13  History of Present Illness:  [name] is a 73 year old gentleman with a history of hypertension, atrial fibrillation and a myocardial bridge.  He is a previous patient of Dr. Susa Simmonds. He also has a history of diabetes. He has felt fairly well. He has not had any episodes of chest pain or shortness of breath.    Nov. 25, 2013: He is very symptomatic with his Atrial fib.  He gets very winded with walking.  He has been on Eliquis and tikosyn 500 BID.  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  He was diagnosed with panic attack.  Enzymes were normal.  He is ready for his cardioversion.  Nov. 27, 2013: Mr. Loup was seen 2 days ago and was found to have atrial fibrillation. He was scheduled for cardioversion. When he showed up for cardioversion yesterday, he was in normal sinus rhythm.  He related to me that he had some medication changes over the past several weeks. He was previously on Tikosyn and citalopram and his atrial fibrillation seemed to be well-controlled on that.  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  It was at that time that he started having increased palpitations, anxiety, and we noted him to be in atrial fibrillation. He then discontinued the Wellbutrin and was started on Zoloft.  He seems to be tolerating this combination. It was at that time that he showed up in the short  stay Center and was found to have already converted to sinus rhythm.  May 31, 2012:  [name] is doing well.  Still in NSR.  No leg edema.  He sticks to a low sodium diet.   He has occasional CP - typically associated with arm / chest movement.   He wants to stop coumadin - interferes with his arthritis medications.  He has maintained NSR since his last visit.    Nov. 24, 2014:  [name] is doing OK.  He has some brief palps - he thinks they are PVCs.  No sustained episodes of Afib.   He has been getting Avastin injections into his eyes.    Jan. 12, 2015:  [name] is seen today as a work in visit for worsening ankle edema.   He has not been able to associate his symptoms with change of diet.  .  he has also been noticing some increasing shortness of breath.  He notes short or shortness of breath with any activity-example take the garbage cans out.   He denies any PND. He uses a CPAP mask.    Jan. 26, 2015:  [name] is seen back today . He had CABG and MAZE on April 05, 2013 - slow recovery following surgery Had a left pleural tap ~ 4 weeks after surgery - 1.2 liters taken off - did much better after that  Had elective cardioversioin in early August, 2015.   [name] was seen 2  weeks ago with symptoms consistent with congestive heart failure. He had been in persistent atrial fibrillation and so his tikosyn  was stopped.  He is still in  atrial fibrillation. His rate is well controlled.     He had an echocardiogram: Left ventricle: Wall thickness was increased in a pattern of moderate LVH. Systolic function was vigorous. The estimated ejection fraction was in the range of 75% to 80%. Wall motion was normal; there were no regional wall motion abnormalities. - Aortic valve: Trivial regurgitation. - Left atrium: The atrium was mildly to moderately dilated. - Right ventricle: The cavity size was mildly dilated. Systolic function was mildly reduced. - Right atrium: The atrium was moderately dilated. -  Pulmonary arteries: Systolic pressure was mildly increased. PA peak pressure: 70mm Hg  He was found to have mild pulmonary hypertension.   He continues to have some dyspnea.  He has ankle edema.  His BP log shows mildly elevated BP.  May 26, 2013: [name] has had 2 V CABG and MAZE  (April 05, 2013)  since I last saw him.   . He has done well He is having some tenderness along the right thigh SVG harvest site.  Also c/o left 4th and 5th finger tingling / numbness.    He has otherwise done well.  No cardiac complaints.    August 30, 2013:  He was cardioverted several weeks ago.  After the cardioversion, his metoprolol was doubled to 25 BID, and diltizem was added   Oct. 2, 2015:  He has gone back into atrial fib.  He has known left pleural effusion. He has had more dyspnea.  Saw Dr. Melvyn Novas yesterday.  He is more lethargic.  Has hypoxemia ( O2 sat of 88% with walking 50 feet)    Dec. 14, 2015:  [name] is followed for atrial fib.  He has had a MAZE procedure which was unsuccessful.  He had an A-fib ablation which has helped.  No A-fib. Swelling has resolved. Dyspnea has resolved.   Doing well from a cardiac standpoint  Needs to have right thumb surgery. Also has bulging discs in his neck with pain radiation down his right arm/ shoulder   Dec. 30, 2015:  [name]'s BP has been elevated. He has had bronchitis and prednisone ( 2 rounds ) for the past several weeks.  Still has not had his thumb surgery.  Current Outpatient Prescriptions on File Prior to Visit  Medication Sig Dispense Refill  . acetaminophen (TYLENOL) 500 MG tablet Take 1,000 mg by mouth every 6 (six) hours as needed for mild pain.     [name] ALPRAZolam (XANAX) 1 MG tablet Take 1 mg by mouth at bedtime as needed for anxiety or sleep.    [name] atorvastatin (LIPITOR) 20 MG tablet Take 20 mg by mouth 3 (three) times a week. Monday Wednesday and Friday    . B Complex Vitamins (VITAMIN B COMPLEX PO) Take 1 tablet by mouth daily.    [name]  BESIVANCE 0.6 % SUSP Place 1 drop into the left eye 4 (four) times daily as needed (after injection).     . Cholecalciferol (VITAMIN D PO) Take 5,000 Units by mouth daily.    . cloNIDine (CATAPRES) 0.2 MG tablet Take 0.2 mg by mouth 2 (two) times daily.    . diphenhydrAMINE (BENADRYL) 25 MG tablet Take 25 mg by mouth every 6 (six) hours as needed for allergies.     . furosemide (LASIX) 40 MG tablet Take 2 tablets (80 mg  total) by mouth daily. 60 tablet 6  . glimepiride (AMARYL) 2 MG tablet Take 2 mg by mouth daily with breakfast. If am fasting blood glucose is less than 120 hold amaryl dose.  For fasting blood glucose over 120 take 1/2 amaryl (1mg ).    [name] HYDROcodone-acetaminophen (NORCO/VICODIN) 5-325 MG per tablet Take 1 tablet by mouth every 6 (six) hours as needed for moderate pain. 30 tablet   . metFORMIN (GLUCOPHAGE-XR) 500 MG 24 hr tablet Take 1,000 mg by mouth 2 (two) times daily before a meal.     . Multiple Vitamin (MULTIVITAMIN) tablet Take 1 tablet by mouth daily.    . potassium chloride SA (K-DUR,KLOR-CON) 20 MEQ tablet Take 2 tablets (40 mEq total) by mouth daily. 30 tablet 6  . ranitidine (ZANTAC) 150 MG tablet Take 150 mg by mouth daily as needed for heartburn.     . sertraline (ZOLOFT) 100 MG tablet TAKE 1 TABLET BY MOUTH DAILY 30 tablet 11  . valsartan (DIOVAN) 160 MG tablet Take 160 mg by mouth daily.    [name] warfarin (COUMADIN) 5 MG tablet Take 7.5 mg by mouth daily. On Sunday 10 mg  Rest of the rest of week 7.5 mg     No current facility-administered medications on file prior to visit.    Allergies  Allergen Reactions  . Sunflower Seed [Sunflower Oil] Swelling and Other (See Comments)    Tongue and lip swelling  . Horse-Derived Products Other (See Comments)    Based on skin test reaction  . Other Other (See Comments)    Tetanus Shot -- skin test reaction    Past Medical History  Diagnosis Date  . Hx of adenomatous colonic polyps   . Diabetes mellitus type II   .  Hypertension   . Diverticulosis 2001  . Hyperlipidemia   . Persistent atrial fibrillation     a. s/p MAZE 04/2013 in setting of CABG. b. Amio stopped in 10/2013 after flutter ablation.  . Obstructive sleep apnea     compliant with CPAP  . Chronic diastolic congestive heart failure   . Hypertensive cardiomyopathy   . History of cardioversion     x3 (years uncertain)  . H/O hiatal hernia   . Basal cell carcinoma   . Adrenal adenoma   . CAD (coronary artery disease)     a. 04/2013 CABG x 2: LIMA to LAD, SVG to RI, EVH via R thigh.  . S/P Maze operation for atrial fibrillation     a. 04/2013: Complete bilateral atrial lesion set using cryothermy and bipolar radiofrequency ablation with clipping of LA appendage (@ time of CABG)  . GERD (gastroesophageal reflux disease)   . Depression   . Anxiety   . DJD (degenerative joint disease)   . 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 DCCV 08/2013. b. s/p RFA 10/2013.  [name] Respiratory failure     a. Hypoxia 10/2013 - required supp O2 as inpatient, did not require it at discharge.  . Pleural effusion, left     a. s/p thoracentesis 05/2013.  [name] PFO (patent foramen ovale)     a. Small PFO by TEE 10/2013.    Past Surgical History  Procedure Laterality Date  . Polinydal cyst      Removed  . Great toe arthrodesis, interphalangeal joint      Right foot  . Retina repair-right    . Cataract extraction      bilateral  . Basal cell carcinoma excision  x3 on face  . Polypectomy    . Cardiac catheterization      myocardial bridge but no cad  . Coronary artery bypass graft N/A 04/05/2013    Procedure: CORONARY ARTERY BYPASS GRAFTING (CABG) TIMES TWO USING LEFT INTERNAL MAMMARY ARTERY AND RIGHT SAPHENOUS LEG VEIN HARVESTED ENDOSCOPICALLY;  Surgeon: Rexene Alberts, MD;  Location: [facility];  Service: Open Heart Surgery;  Laterality: N/A;  . Maze N/A 04/05/2013    Procedure: MAZE;  Surgeon: Rexene Alberts, MD;  Location: [facility];  Service: Open Heart Surgery;   Laterality: N/A;  . Intraoperative transesophageal echocardiogram N/A 04/05/2013    Procedure: INTRAOPERATIVE TRANSESOPHAGEAL ECHOCARDIOGRAM;  Surgeon: Rexene Alberts, MD;  Location: [facility];  Service: Open Heart Surgery;  Laterality: N/A;  . Tee without cardioversion N/A 08/23/2013    Procedure: TRANSESOPHAGEAL ECHOCARDIOGRAM (TEE);  Surgeon: Sanda Klein, MD;  Location: [facility];  Service: Cardiovascular;  Laterality: N/A;  . Cardioversion N/A 08/23/2013    Procedure: CARDIOVERSION;  Surgeon: Sanda Klein, MD;  Location: [facility] ENDOSCOPY;  Service: Cardiovascular;  Laterality: N/A;  . Tee without cardioversion N/A 10/26/2013    Procedure: TRANSESOPHAGEAL ECHOCARDIOGRAM (TEE);  Surgeon: Sueanne Margarita, MD;  Location: [facility] ENDOSCOPY;  Service: Cardiovascular;  Laterality: N/A;  . Left heart catheterization with coronary angiogram N/A 03/07/2013    Procedure: LEFT HEART CATHETERIZATION WITH CORONARY ANGIOGRAM;  Surgeon: Burnell Blanks, MD;  Location: [facility] CATH LAB;  Service: Cardiovascular;  Laterality: N/A;  . Atrial fibrillation ablation N/A 10/26/2013    Procedure: ATRIAL FIBRILLATION ABLATION;  Surgeon: Coralyn Mark, MD;  Location: [facility] CATH LAB;  Service: Cardiovascular;  Laterality: N/A;    History  Smoking status  . Former Smoker -- 4.00 packs/day for 25 years  . Types: Cigarettes  . Quit date: 01/05/1981  Smokeless tobacco  . Never Used    History  Alcohol Use  . Yes    Comment: 1-5 drinks per week    Family History  Problem Relation Age of Onset  . Colon cancer Mother     Family History/Uncle   . Colon polyps Mother     Family History  . Atrial fibrillation Mother   . Hypertension Mother   . Colon polyps Sister     Family history  . Diabetes Maternal Uncle   . Stroke Paternal Uncle   . Dementia Father     Reviw of Systems:  Reviewed in the HPI.  All other systems are negative.  Physical Exam: BP 170/92 mmHg  Pulse 62  Ht 5\' 11"  (1.803 m)  Wt 188 lb 12.8  oz (85.639 kg)  BMI 26.34 kg/m2  SpO2 98% The patient is alert and oriented x 3.  The mood and affect are normal.   Skin: warm and dry.  Color is normal.    HEENT:   the sclera are nonicteric.  The mucous membranes are moist.  The carotids are 2+ without bruits.  There is no thyromegaly.  There is no JVD.   Lungs: clear.  The chest wall is non tender.   Heart: RR.   with a normal S1 and S2.  He has a hyperdynamic PMI. He has a very soft but brief 2/6 systolic murmur at the left cardiac  border. 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 Abdomen: good bowel sounds.  There is no guarding or rebound.  There is no hepatosplenomegaly or tenderness.  There are no masses.  Extremities:  no clubbing, cyanosis, or edema.  The legs are without rashes.  The distal pulses are intact.  Neuro:  Cranial nerves II - XII are intact.  Motor and sensory functions are intact.   The gait is normal.  ECG:   Assessment / Plan:

## 2014-01-08 ENCOUNTER — Ambulatory Visit (HOSPITAL_BASED_OUTPATIENT_CLINIC_OR_DEPARTMENT_OTHER): Admission: RE | Admit: 2014-01-08 | Payer: Medicare Other | Source: Ambulatory Visit | Admitting: Orthopedic Surgery

## 2014-01-10 ENCOUNTER — Ambulatory Visit: Payer: Medicare Other | Admitting: Cardiovascular Disease

## 2014-01-11 ENCOUNTER — Ambulatory Visit: Payer: Self-pay | Admitting: Internal Medicine

## 2014-01-11 ENCOUNTER — Ambulatory Visit: Payer: Self-pay

## 2014-01-18 ENCOUNTER — Encounter (HOSPITAL_COMMUNITY): Payer: Self-pay | Admitting: Cardiology

## 2014-01-29 ENCOUNTER — Other Ambulatory Visit: Payer: Self-pay | Admitting: Orthopedic Surgery

## 2014-01-30 ENCOUNTER — Encounter: Payer: Self-pay | Admitting: Internal Medicine

## 2014-01-30 ENCOUNTER — Ambulatory Visit (INDEPENDENT_AMBULATORY_CARE_PROVIDER_SITE_OTHER): Payer: Medicare Other | Admitting: Internal Medicine

## 2014-01-30 VITALS — BP 136/74 | HR 88 | Temp 99.5°F | Resp 16 | Ht 71.0 in | Wt 199.8 lb

## 2014-01-30 DIAGNOSIS — Z23 Encounter for immunization: Secondary | ICD-10-CM

## 2014-01-30 DIAGNOSIS — G4733 Obstructive sleep apnea (adult) (pediatric): Secondary | ICD-10-CM

## 2014-01-30 DIAGNOSIS — Z951 Presence of aortocoronary bypass graft: Secondary | ICD-10-CM

## 2014-01-30 DIAGNOSIS — I10 Essential (primary) hypertension: Secondary | ICD-10-CM

## 2014-01-30 DIAGNOSIS — R6889 Other general symptoms and signs: Secondary | ICD-10-CM | POA: Diagnosis not present

## 2014-01-30 DIAGNOSIS — Z1212 Encounter for screening for malignant neoplasm of rectum: Secondary | ICD-10-CM

## 2014-01-30 DIAGNOSIS — Z125 Encounter for screening for malignant neoplasm of prostate: Secondary | ICD-10-CM | POA: Diagnosis not present

## 2014-01-30 DIAGNOSIS — E559 Vitamin D deficiency, unspecified: Secondary | ICD-10-CM

## 2014-01-30 DIAGNOSIS — Z7901 Long term (current) use of anticoagulants: Secondary | ICD-10-CM

## 2014-01-30 DIAGNOSIS — E782 Mixed hyperlipidemia: Secondary | ICD-10-CM

## 2014-01-30 DIAGNOSIS — Z9989 Dependence on other enabling machines and devices: Secondary | ICD-10-CM

## 2014-01-30 DIAGNOSIS — Z0000 Encounter for general adult medical examination without abnormal findings: Secondary | ICD-10-CM

## 2014-01-30 DIAGNOSIS — Z0001 Encounter for general adult medical examination with abnormal findings: Secondary | ICD-10-CM

## 2014-01-30 DIAGNOSIS — Z79899 Other long term (current) drug therapy: Secondary | ICD-10-CM

## 2014-01-30 DIAGNOSIS — N058 Unspecified nephritic syndrome with other morphologic changes: Secondary | ICD-10-CM | POA: Diagnosis not present

## 2014-01-30 DIAGNOSIS — F324 Major depressive disorder, single episode, in partial remission: Secondary | ICD-10-CM

## 2014-01-30 DIAGNOSIS — F325 Major depressive disorder, single episode, in full remission: Secondary | ICD-10-CM

## 2014-01-30 DIAGNOSIS — E1129 Type 2 diabetes mellitus with other diabetic kidney complication: Secondary | ICD-10-CM | POA: Diagnosis not present

## 2014-01-30 DIAGNOSIS — Z1331 Encounter for screening for depression: Secondary | ICD-10-CM

## 2014-01-30 LAB — CBC WITH DIFFERENTIAL/PLATELET
Basophils Absolute: 0 10*3/uL (ref 0.0–0.1)
Basophils Relative: 0 % (ref 0–1)
Eosinophils Absolute: 0.1 10*3/uL (ref 0.0–0.7)
Eosinophils Relative: 1 % (ref 0–5)
HCT: 41.9 % (ref 39.0–52.0)
Hemoglobin: 13.8 g/dL (ref 13.0–17.0)
Lymphocytes Relative: 14 % (ref 12–46)
Lymphs Abs: 0.9 10*3/uL (ref 0.7–4.0)
MCH: 30.5 pg (ref 26.0–34.0)
MCHC: 32.9 g/dL (ref 30.0–36.0)
MCV: 92.5 fL (ref 78.0–100.0)
MPV: 8.3 fL — ABNORMAL LOW (ref 8.6–12.4)
Monocytes Absolute: 0.5 10*3/uL (ref 0.1–1.0)
Monocytes Relative: 7 % (ref 3–12)
Neutro Abs: 5.1 10*3/uL (ref 1.7–7.7)
Neutrophils Relative %: 78 % — ABNORMAL HIGH (ref 43–77)
Platelets: 255 10*3/uL (ref 150–400)
RBC: 4.53 MIL/uL (ref 4.22–5.81)
RDW: 16 % — ABNORMAL HIGH (ref 11.5–15.5)
WBC: 6.5 10*3/uL (ref 4.0–10.5)

## 2014-01-30 LAB — HEMOGLOBIN A1C
Hgb A1c MFr Bld: 7.8 % — ABNORMAL HIGH (ref <5.7)
Mean Plasma Glucose: 177 mg/dL — ABNORMAL HIGH (ref <117)

## 2014-01-30 LAB — PROTIME-INR
INR: 1.56 — ABNORMAL HIGH
Prothrombin Time: 18.7 s — ABNORMAL HIGH (ref 11.6–15.2)

## 2014-01-30 NOTE — Patient Instructions (Signed)
 Recommend the book "The END of DIETING" by Dr Joel Fuhrman   & the book "The END of DIABETES " by Dr Joel Fuhrman  At [employer] - get book & Audio CD's      Being diabetic has a  300% increased risk for heart attack, stroke, cancer, and alzheimer- type vascular dementia. It is very important that you work harder with diet by avoiding all foods that are white except chicken & fish. Avoid white rice (brown & wild rice is OK), white potatoes (sweetpotatoes in moderation is OK), White bread or wheat bread or anything made out of white flour like bagels, donuts, rolls, buns, biscuits, cakes, pastries, cookies, pizza crust, and pasta (made from white flour & egg whites) - vegetarian pasta or spinach or wheat pasta is OK. Multigrain breads like Arnold's or [employer], or multigrain sandwich thins or flatbreads.  Diet, exercise and weight loss can reverse and cure diabetes in the early stages.  Diet, exercise and weight loss is very important in the control and prevention of complications of diabetes which affects every system in your body, ie. Brain - dementia/stroke, eyes - glaucoma/blindness, heart - heart attack/heart failure, kidneys - dialysis, stomach - gastric paralysis, intestines - malabsorption, nerves - severe painful neuritis, circulation - gangrene & loss of a leg(s), and finally cancer and Alzheimers.    I recommend avoid fried & greasy foods,  sweets/candy, white rice (brown or wild rice or Quinoa is OK), white potatoes (sweet potatoes are OK) - anything made from white flour - bagels, doughnuts, rolls, buns, biscuits,white and wheat breads, pizza crust and traditional pasta made of white flour & egg white(vegetarian pasta or spinach or wheat pasta is OK).  Multi-grain bread is OK - like multi-grain flat bread or sandwich thins. Avoid alcohol in excess. Exercise is also important.    Eat all the vegetables you want - avoid meat, especially red meat and dairy - especially cheese.  Cheese  is the most concentrated form of trans-fats which is the worst thing to clog up our arteries. Veggie cheese is OK which can be found in the fresh produce section at [employer] or [employer] or [employer]  Preventive Care for Adults A healthy lifestyle and preventive care can promote health and wellness. Preventive health guidelines for men include the following key practices:  A routine yearly physical is a good way to check with your health care provider about your health and preventative screening. It is a chance to share any concerns and updates on your health and to receive a thorough exam.  Visit your dentist for a routine exam and preventative care every 6 months. Brush your teeth twice a day and floss once a day. Good oral hygiene prevents tooth decay and gum disease.  The frequency of eye exams is based on your age, health, family medical history, use of contact lenses, and other factors. Follow your health care provider's recommendations for frequency of eye exams.  Eat a healthy diet. Foods such as vegetables, fruits, whole grains, low-fat dairy products, and lean protein foods contain the nutrients you need without too many calories. Decrease your intake of foods high in solid fats, added sugars, and salt. Eat the right amount of calories for you.Get information about a proper diet from your health care provider, if necessary.  Regular physical exercise is one of the most important things you can do for your health. Most adults should get at least 150 minutes of moderate-intensity exercise (any activity that   increases your heart rate and causes you to sweat) each week. In addition, most adults need muscle-strengthening exercises on 2 or more days a week.  Maintain a healthy weight. The body mass index (BMI) is a screening tool to identify possible weight problems. It provides an estimate of body fat based on height and weight. Your health care provider can find your BMI and can help you  achieve or maintain a healthy weight.For adults 20 years and older:  A BMI below 18.5 is considered underweight.  A BMI of 18.5 to 24.9 is normal.  A BMI of 25 to 29.9 is considered overweight.  A BMI of 30 and above is considered obese.  Maintain normal blood lipids and cholesterol levels by exercising and minimizing your intake of saturated fat. Eat a balanced diet with plenty of fruit and vegetables. Blood tests for lipids and cholesterol should begin at age 20 and be repeated every 5 years. If your lipid or cholesterol levels are high, you are over 50, or you are at high risk for heart disease, you may need your cholesterol levels checked more frequently.Ongoing high lipid and cholesterol levels should be treated with medicines if diet and exercise are not working.  If you smoke, find out from your health care provider how to quit. If you do not use tobacco, do not start.  Lung cancer screening is recommended for adults aged 55-80 years who are at high risk for developing lung cancer because of a history of smoking. A yearly low-dose CT scan of the lungs is recommended for people who have at least a 30-pack-year history of smoking and are a current smoker or have quit within the past 15 years. A pack year of smoking is smoking an average of 1 pack of cigarettes a day for 1 year (for example: 1 pack a day for 30 years or 2 packs a day for 15 years). Yearly screening should continue until the smoker has stopped smoking for at least 15 years. Yearly screening should be stopped for people who develop a health problem that would prevent them from having lung cancer treatment.  If you choose to drink alcohol, do not have more than 2 drinks per day. One drink is considered to be 12 ounces (355 mL) of beer, 5 ounces (148 mL) of wine, or 1.5 ounces (44 mL) of liquor.  Avoid use of street drugs. Do not share needles with anyone. Ask for help if you need support or instructions about stopping the use of  drugs.  High blood pressure causes heart disease and increases the risk of stroke. Your blood pressure should be checked at least every 1-2 years. Ongoing high blood pressure should be treated with medicines, if weight loss and exercise are not effective.  If you are 45-79 years old, ask your health care provider if you should take aspirin to prevent heart disease.  Diabetes screening involves taking a blood sample to check your fasting blood sugar level. Testing should be considered at a younger age or be carried out more frequently if you are overweight and have at least 1 risk factor for diabetes.  Colorectal cancer can be detected and often prevented. Most routine colorectal cancer screening begins at the age of 50 and continues through age 75. However, your health care provider may recommend screening at an earlier age if you have risk factors for colon cancer. On a yearly basis, your health care provider may provide home test kits to check for hidden blood in   the stool. Use of a small camera at the end of a tube to directly examine the colon (sigmoidoscopy or colonoscopy) can detect the earliest forms of colorectal cancer. Talk to your health care provider about this at age 50, when routine screening begins. Direct exam of the colon should be repeated every 5-10 years through age 75, unless early forms of precancerous polyps or small growths are found.  Hepatitis C blood testing is recommended for all people born from 1945 through 1965 and any individual with known risks for hepatitis C.  Screening for abdominal aortic aneurysm (AAA)  by ultrasound is recommended for people who have history of high blood pressure or who are current or former smokers.  Healthy men should  receive prostate-specific antigen (PSA) blood tests as part of routine cancer screening. Talk with your health care provider about prostate cancer screening.  Testicular cancer screening is  recommended for adult males.  Screening includes self-exam, a health care provider exam, and other screening tests. Consult with your health care provider about any symptoms you have or any concerns you have about testicular cancer.  Use sunscreen. Apply sunscreen liberally and repeatedly throughout the day. You should seek shade when your shadow is shorter than you. Protect yourself by wearing long sleeves, pants, a wide-brimmed hat, and sunglasses year round, whenever you are outdoors.  Once a month, do a whole-body skin exam, using a mirror to look at the skin on your back. Tell your health care provider about new moles, moles that have irregular borders, moles that are larger than a pencil eraser, or moles that have changed in shape or color.  Stay current with required vaccines (immunizations).  Influenza vaccine. All adults should be immunized every year.  Tetanus, diphtheria, and acellular pertussis (Td, Tdap) vaccine. An adult who has not previously received Tdap or who does not know his vaccine status should receive 1 dose of Tdap. This initial dose should be followed by tetanus and diphtheria toxoids (Td) booster doses every 10 years. Adults with an unknown or incomplete history of completing a 3-dose immunization series with Td-containing vaccines should begin or complete a primary immunization series including a Tdap dose. Adults should receive a Td booster every 10 years.  Zoster vaccine. One dose is recommended for adults aged 60 years or older unless certain conditions are present.    PREVNAR - Pneumococcal 13-valent conjugate (PCV13) vaccine. When indicated, a person who is uncertain of his immunization history and has no record of immunization should receive the PCV13 vaccine. An adult aged 19 years or older who has certain medical conditions and has not been previously immunized should receive 1 dose of PCV13 vaccine. This PCV13 should be followed with a dose of pneumococcal polysaccharide (PPSV23) vaccine. The  PPSV23 vaccine dose should be obtained at least 8 weeks after the dose of PCV13 vaccine. An adult aged 19 years or older who has certain medical conditions and previously received 1 or more doses of PPSV23 vaccine should receive 1 dose of PCV13. The PCV13 vaccine dose should be obtained 1 or more years after the last PPSV23 vaccine dose.    PNEUMOVAX - Pneumococcal polysaccharide (PPSV23) vaccine. When PCV13 is also indicated, PCV13 should be obtained first. All adults aged 65 years and older should be immunized. An adult younger than age 65 years who has certain medical conditions should be immunized. Any person who resides in a nursing home or long-term care facility should be immunized. An adult smoker should be immunized. People   with an immunocompromised condition and certain other conditions should receive both PCV13 and PPSV23 vaccines. People with human immunodeficiency virus (HIV) infection should be immunized as soon as possible after diagnosis. Immunization during chemotherapy or radiation therapy should be avoided. Routine use of PPSV23 vaccine is not recommended for American Indians, [place] Natives, or people younger than 65 years unless there are medical conditions that require PPSV23 vaccine. When indicated, people who have unknown immunization and have no record of immunization should receive PPSV23 vaccine. One-time revaccination 5 years after the first dose of PPSV23 is recommended for people aged 19-64 years who have chronic kidney failure, nephrotic syndrome, asplenia, or immunocompromised conditions. People who received 1-2 doses of PPSV23 before age 65 years should receive another dose of PPSV23 vaccine at age 65 years or later if at least 5 years have passed since the previous dose. Doses of PPSV23 are not needed for people immunized with PPSV23 at or after age 65 years.    Hepatitis A vaccine. Adults who wish to be protected from this disease, have certain high-risk conditions, work  with hepatitis A-infected animals, work in hepatitis A research labs, or travel to or work in countries with a high rate of hepatitis A should be immunized. Adults who were previously unvaccinated and who anticipate close contact with an international adoptee during the first 60 days after arrival in the [place] from a country with a high rate of hepatitis A should be immunized.    Hepatitis B vaccine. Adults should be immunized if they wish to be protected from this disease, have certain high-risk conditions, may be exposed to blood or other infectious body fluids, are household contacts or sex partners of hepatitis B positive people, are clients or workers in certain care facilities, or travel to or work in countries with a high rate of hepatitis B.   Preventive Service / Frequency   Ages 65 and over  Blood pressure check.  Lipid and cholesterol check.  Lung cancer screening. / Every year if you are aged 55-80 years and have a 30-pack-year history of smoking and currently smoke or have quit within the past 15 years. Yearly screening is stopped once you have quit smoking for at least 15 years or develop a health problem that would prevent you from having lung cancer treatment.  Fecal occult blood test (FOBT) of stool. You may not have to do this test if you get a colonoscopy every 10 years.  Flexible sigmoidoscopy** or colonoscopy.** / Every 5 years for a flexible sigmoidoscopy or every 10 years for a colonoscopy beginning at age 50 and continuing until age 75.  Hepatitis C blood test.** / For all people born from 1945 through 1965 and any individual with known risks for hepatitis C.  Abdominal aortic aneurysm (AAA) screening./ Screening current or former smokers or have Hypertension.  Skin self-exam. / Monthly.  Influenza vaccine. / Every year.  Tetanus, diphtheria, and acellular pertussis (Tdap/Td) vaccine.** / 1 dose of Td every 10 years.   Zoster vaccine.** / 1 dose for  adults aged 60 years or older.         Pneumococcal 13-valent conjugate (PCV13) vaccine.    Pneumococcal polysaccharide (PPSV23) vaccine.     Hepatitis A vaccine.** / Consult your health care provider.  Hepatitis B vaccine.** / Consult your health care provider. Screening for abdominal aortic aneurysm (AAA)  by ultrasound is recommended for people who have history of high blood pressure or who are current or former smokers. 

## 2014-01-30 NOTE — Progress Notes (Signed)
Patient ID: [name], male   DOB: 08-22-1940, 74 y.o.   MRN: [id number]  [facility] VISIT AND CPE  Assessment:   1. Essential hypertension  - Microalbumin / creatinine urine ratio - [place], RETROPERITNL ABD,  LTD  2. 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 Hyperlipidemia  - Lipid panel  4. 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 Vitamin D deficiency  - Vit D  25 hydroxy   6. Screening for rectal cancer  - POC Hemoccult Bld/Stl   7. Prostate cancer screening  - PSA  8. Depression screen   9. Positive depression screening   10. Medication management  - Urine Microscopic - CBC with Differential/Platelet - BASIC METABOLIC PANEL WITH GFR - Hepatic function panel - Magnesium - TSH  11. Major depression in remission   12. Chronic anticoagulation  - Protime-INR  13. Need for prophylactic vaccination against Streptococcus pneumoniae (pneumococcus)  - Pneumococcal conjugate vaccine 13-valent  14. Routine general medical examination at a health care facility  15. OSA on CPAP -   Plan:   During the course of the visit the patient was educated and counseled about appropriate screening and preventive services including:    Pneumococcal vaccine   Influenza vaccine  Td vaccine  Screening electrocardiogram  Bone densitometry screening  Colorectal cancer screening  Diabetes screening  Glaucoma screening  Nutrition counseling   Advanced directives: requested  Screening recommendations, referrals: Vaccinations: DT vaccine 2002 & recc repeat booster Influenza vaccine HD 09/26/13 Pneumococcal vaccine 10/20/2011 Prevnar vaccine 01/30/2014 Shingles vaccine undecided Hep B vaccine not indicated  Nutrition assessed and recommended  Colonoscopy Dec 2008 Dr Deatra Ina - recc 10 yr f/u Recommended yearly  ophthalmology/optometry visit for glaucoma screening and checkup Recommended yearly dental visit for hygiene and checkup Advanced directives - no - offored forms  Conditions/risks identified: BMI: Discussed weight loss, diet, and increase physical activity.  Increase physical activity: AHA recommends 150 minutes of physical activity a week.  Medications reviewed Diabetes is near goal, ACE/ARB therapy: Yes. Urinary Incontinence is not an issue: discussed non pharmacology and pharmacology options.  Fall risk: low- discussed PT, home fall assessment, medications.    Subjective:  [name] is a 74 y.o. male who presents for Medicare Annual Wellness Visit and complete physical.  Date of last medicare wellness visit is 09/26/2013.  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    Patient has been treated for HTN the 1990's & in 1996 he a heart cath showing  Myocardial bridging. . In 2010 he had a negative Cardiolytes scan.In Apr 2015 he underwent CABG and a MAZE procedure for refractory ch Afib. Then in Aug 2015 he failed DCCV of refractory Aflutter with relapse and in Oct 2015 he had RFA with no recurrence of sx's.He remains on coumadin.  He is followed by Dr's Nahser & Allred. Today's BP was 136/74 mmHg. Patient  no complaints of any cardiac type chest pain, palpitations, orthopnea/PND, dizziness, claudication, or dependent edema. He does admit some mild DOE with base O2sat ~ 97% dropping to 92% which recovers promptly. He has completed Cardiac Rehab and plans to continue with "Silver Sneakers".   Hyperlipidemia is controlled with diet & meds. Patient denies myalgias or other med SE's. Last  Lipids were at goal -  Total Chol 100; HDL  31; LDL 51; Trig 90 on 09/26/2013:   Also, the patient has history of T2_NIDDM since 1995 initially controlled with diet and then treatment started in 2003.  and has had no  symptoms of reactive hypoglycemia, diabetic polys, paresthesias or visual blurring.  Last A1c was  6.1% on  09/26/2013.   Further, the patient also has history of Vitamin D Deficiency of 39 in 2008and supplements vitamin D without any suspected side-effects. Last vitamin D was  71 on  09/26/2013.   Names of Other Physician/Practitioners you currently use: 1. [facility] here for primary care 2. Dr Tempie Hoist, eye doctor, every 6 weeks fo eye injection for diabetic eye dz. 3. Dr Lanell Persons, dentist, last visit Nov 2015  Patient Care Team: [name], MD as PCP - General (Internal Medicine) Inda Castle, MD as Consulting Physician (Gastroenterology) Peter M Martinique, MD as Consulting Physician (Cardiology) Rexene Alberts, MD as Consulting Physician (Cardiothoracic Surgery) Ina Homes, DDS as Referring Physician Hayden Pedro, MD as Consulting Physician (Ophthalmology) Lavonna Monarch, MD as Consulting Physician (Dermatology) Josue Hector, MD as Consulting Physician (Cardiology) Thompson Grayer, MD as Consulting Physician (Cardiology)  Medication Review: Medication Sig  . acetaminophen (TYLENOL) 500 MG tablet Take 1,000 mg by mouth every 6 (six) hours as needed for mild pain.   [name] ALPRAZolam (XANAX) 1 MG tablet Take 1 mg by mouth at bedtime as needed for anxiety or sleep.  . B Complex Vitamins (VITAMIN B COMPLEX PO) Take 1 tablet by mouth daily.  [name] BESIVANCE 0.6 % SUSP Place 1 drop into the left eye 4 (four) times daily as needed (after injection).   . Cholecalciferol (VITAMIN D PO) Take 5,000 Units by mouth daily.  . cloNIDine (CATAPRES) 0.2 MG tablet Take 0.2 mg by mouth 2 (two) times daily.  . diphenhydrAMINE (BENADRYL) 25 MG tablet Take 25 mg by mouth every 6 (six) hours as needed for allergies.   . furosemide (LASIX) 40 MG tablet Take 2 tablets (80 mg total) by mouth daily.  [name] glimepiride (AMARYL) 2 MG tablet Take 2 mg by  mouth daily with breakfast. If am fasting blood glucose is less than 120 hold amaryl dose.  For fasting blood glucose over 120 take 1/2 amaryl (1mg ).  . HYDROcodone-acetaminophen (NORCO/VICODIN) 5-325 MG per tablet Take 1 tablet by mouth every 6 (six) hours as needed for moderate pain.  . metFORMIN (GLUCOPHAGE-XR) 500 MG 24 hr tablet Take 1,000 mg by mouth 2 (two) times daily before a meal.   . Multiple Vitamin (MULTIVITAMIN) tablet Take 1 tablet by mouth daily.  . potassium chloride SA (K-DUR,KLOR-CON) 20 MEQ tablet Take 2 tablets (40 mEq total) by mouth daily.  . ranitidine (ZANTAC) 150 MG tablet Take 150 mg by mouth daily as needed for heartburn.   . sertraline (ZOLOFT) 100 MG tablet TAKE 1 TABLET BY MOUTH DAILY  . valsartan (DIOVAN) 320 MG tablet Take 1 tablet (320 mg total) by mouth daily.  [name] warfarin (COUMADIN) 5 MG tablet Take 7.5 mg by mouth daily. On Sunday 10 mg  Rest of the rest of week 7.5 mg   Current Problems (verified) Patient Active Problem List   Diagnosis Date Noted  . HTN (hypertension) 10/25/2013    Priority: Medium  . Major depression in remission 01/30/2014  . PFO (patent foramen ovale) 10/30/2013  . Anemia 10/30/2013  . CAD (coronary artery disease) 10/29/2013  .  Acute on chronic respiratory failure 10/27/2013  . Chronic anticoagulation 08/22/2013  . Acute on chronic diastolic CHF (congestive heart failure), NYHA class 3 08/21/2013  . Atypical atrial flutter s/p RFA 10/26/13 08/21/2013  . Vitamin D deficiency 05/19/2013  . Medication management 05/19/2013  . Pleural effusion on left 05/15/2013  . 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 GERD   . Anxiety   . DJD (degenerative joint disease)   . 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 Chronic diastolic congestive heart failure   . Hypertensive cardiomyopathy   . Hyperlipidemia 09/03/2008  . PAF (paroxysmal atrial fibrillation) 09/03/2008  . DIVERTICULOSIS-COLON 09/03/2008  . Colonic Polyps  09/03/2008    Screening Tests Health Maintenance  Topic Date Due  . OPHTHALMOLOGY EXAM  03/31/1950  . ZOSTAVAX  04/01/1999  . TETANUS/TDAP  01/05/2010  . URINE MICROALBUMIN  10/19/2012  . HEMOGLOBIN A1C  03/27/2014  . INFLUENZA VACCINE  08/06/2014  . FOOT EXAM  01/31/2015  . COLONOSCOPY  12/10/2016  . PNEUMOCOCCAL POLYSACCHARIDE VACCINE AGE 60 AND OVER  Completed    Immunization History  Administered Date(s) Administered  . Influenza Split 10/25/2012  . Influenza, High Dose Seasonal PF 09/26/2013  . Pneumococcal Conjugate-13 01/30/2014  . Pneumococcal Polysaccharide-23 10/20/2011  . Td 01/06/2000    Preventative care: Last colonoscopy: Dec 2008 Dr Deatra Ina recc 10 yr f/u  Prior vaccinations: TD : 2002  Influenza: HD 09/26/2013  Pneumococcal: 10/20/2011 Prevnar: 01/30/2014 Shingles/Zostavax: deferred  History reviewed: allergies, current medications, past family history, past medical history, past social history, past surgical history and problem list  Risk Factors: Tobacco History  Substance Use Topics  . Smoking status: Former Smoker -- 4.00 packs/day for 25 years    Types: Cigarettes    Quit date: 01/05/1981  . Smokeless tobacco: Never Used  . Alcohol Use: Yes     Comment: 1-5 drinks per week   He does not smoke.  Patient is a former smoker. Are there smokers in your home (other than you)?  No  Alcohol Current alcohol use: social drinker  Caffeine Current caffeine use: denies use  Exercise Current exercise: cardiovascular workout on exercise equipment and walking  Nutrition/Diet Current diet: in general, a "healthy" diet    Cardiac risk factors: advanced age (older than 15 for men, 62 for women), diabetes mellitus, dyslipidemia, hypertension, male gender and smoking/ tobacco exposure.  Depression Screen (Note: if answer to either of the following is "Yes", a more complete depression screening is indicated)   Q1: Over the past two weeks, have you felt  down, depressed or hopeless? No  Q2: Over the past two weeks, have you felt little interest or pleasure in doing things? No  Have you lost interest or pleasure in daily life? No  Do you often feel hopeless? No  Do you cry easily over simple problems? No  Activities of Daily Living In your present state of health, do you have any difficulty performing the following activities?:  Driving? No Managing money?  No Feeding yourself? No Getting from bed to chair? No Climbing a flight of stairs? No Preparing food and eating?: No Bathing or showering? No Getting dressed: No Getting to the toilet? No Using the toilet:No Moving around from place to place: No In the past year have you fallen or had a near fall?:No   Are you sexually active?  Yes  Do you have more than one partner?  No  Vision Difficulties: No  Hearing Difficulties:  No Do you often ask people to speak up or repeat themselves? No Do you experience ringing or noises in your ears? No Do you have difficulty understanding soft or whispered voices? No  Cognition  Do you feel that you have a problem with memory?No  Do you often misplace items? No  Do you feel safe at home?  Yes  Advanced directives Does patient have a [place]? No Does patient have a Living Will? No   Objective:     BP 136/74 mmHg  Pulse 88  Temp(Src) 99.5 F (37.5 C)  Resp 16  Ht 5\' 11"  (1.803 m)  Wt 199 lb 12.8 oz (90.629 kg)  BMI 27.88 kg/m2  General appearance: alert, no distress, WD/WN, male Cognitive Testing  Alert? Yes  Normal Appearance? Yes  Oriented to person? Yes  Place? Yes   Time? Yes  Recall of three objects?  Yes  Can perform simple calculations? Yes  Displays appropriate judgment? Yes  Can read the correct time from a watch/clock? Yes  HEENT: normocephalic, sclerae anicteric, TMs pearly, nares patent, no discharge or erythema, pharynx normal Oral cavity: MMM, no lesions Neck: supple, no  lymphadenopathy, no thyromegaly, no masses Heart: RRR, normal S1, S2, no murmurs Chest: Median sternotomy scar.  Lungs: CTA bilaterally, no wheezes, rhonchi, or rales Abdomen: +bs, soft, non tender, non distended, no masses, no hepatomegaly, no splenomegaly GU: Nl male DRE - prostate Nl for age w/o nodules. Hemoccult Negative. Musculoskeletal: nontender, no swelling, no obvious deformity Extremities: no edema, no cyanosis, no clubbing Pulses: 2+ symmetric, upper and lower extremities, normal cap refill Neurological: alert, oriented x 3, CN2-12 intact, strength normal upper extremities and lower extremities, sensation normal throughout, DTRs 2+ throughout, no cerebellar signs, gait normal Psychiatric: normal affect, behavior normal, pleasant   Medicare Attestation I have personally reviewed: The patient's medical and social history Their use of alcohol, tobacco or illicit drugs Their current medications and supplements The patient's functional ability including ADLs,fall risks, home safety risks, cognitive, and hearing and visual impairment Diet and physical activities Evidence for depression or mood disorders  The patient's weight, height, BMI, and visual acuity have been recorded in the chart.  I have made referrals, counseling, and provided education to the patient based on review of the above and I have provided the patient with a written personalized care plan for preventive services.    Maebel Marasco DAVID, MD   01/30/2014

## 2014-01-31 LAB — LIPID PANEL
CHOL/HDL RATIO: 3.8 ratio
Cholesterol: 161 mg/dL (ref 0–200)
HDL: 42 mg/dL (ref >39)
LDL Cholesterol: 88 mg/dL (ref 0–99)
Triglycerides: 154 mg/dL — ABNORMAL HIGH (ref <150)
VLDL: 31 mg/dL (ref 0–40)

## 2014-01-31 LAB — URINALYSIS, MICROSCOPIC ONLY
BACTERIA UA: NONE SEEN
Casts: NONE SEEN
Crystals: NONE SEEN
SQUAMOUS EPITHELIAL / LPF: NONE SEEN

## 2014-01-31 LAB — TSH: TSH: 1.629 u[IU]/mL (ref 0.350–4.500)

## 2014-01-31 LAB — HEPATIC FUNCTION PANEL
ALK PHOS: 90 U/L (ref 39–117)
ALT: 14 U/L (ref 0–53)
AST: 15 U/L (ref 0–37)
Albumin: 4.7 g/dL (ref 3.5–5.2)
BILIRUBIN DIRECT: 0.2 mg/dL (ref 0.0–0.3)
Indirect Bilirubin: 0.8 mg/dL (ref 0.2–1.2)
Total Bilirubin: 1 mg/dL (ref 0.2–1.2)
Total Protein: 7.4 g/dL (ref 6.0–8.3)

## 2014-01-31 LAB — MAGNESIUM: Magnesium: 1.8 mg/dL (ref 1.5–2.5)

## 2014-01-31 LAB — BASIC METABOLIC PANEL WITH GFR
BUN: 20 mg/dL (ref 6–23)
CHLORIDE: 102 meq/L (ref 96–112)
CO2: 32 mEq/L (ref 19–32)
Calcium: 9.3 mg/dL (ref 8.4–10.5)
Creat: 0.94 mg/dL (ref 0.50–1.35)
GFR, Est African American: >89 mL/min
GFR, Est Non African American: 80 mL/min
Glucose, Bld: 228 mg/dL — ABNORMAL HIGH (ref 70–99)
Potassium: 3.9 mEq/L (ref 3.5–5.3)
SODIUM: 141 meq/L (ref 135–145)

## 2014-01-31 LAB — INSULIN, FASTING: INSULIN FASTING, SERUM: 10.1 u[IU]/mL (ref 2.0–19.6)

## 2014-01-31 LAB — VITAMIN D 25 HYDROXY (VIT D DEFICIENCY, FRACTURES): Vit D, 25-Hydroxy: 50 ng/mL (ref 30–100)

## 2014-01-31 LAB — PSA: PSA: 0.69 ng/mL (ref <=4.00)

## 2014-01-31 LAB — MICROALBUMIN / CREATININE URINE RATIO
CREATININE, URINE: 24.4 mg/dL
Microalb Creat Ratio: 106.6 mg/g — ABNORMAL HIGH (ref 0.0–30.0)
Microalb, Ur: 2.6 mg/dL — ABNORMAL HIGH (ref <2.0)

## 2014-02-09 ENCOUNTER — Encounter (HOSPITAL_BASED_OUTPATIENT_CLINIC_OR_DEPARTMENT_OTHER): Payer: Self-pay | Admitting: *Deleted

## 2014-02-09 NOTE — Progress Notes (Signed)
Reviewed with dr crews-ok-do not need to repeat inr since not stopping coumadin-last 01/30/14- 1.56

## 2014-02-09 NOTE — Progress Notes (Signed)
Had labs done 01/30/14-ekg 12/15-was told he did not need to stop coumadin

## 2014-02-13 ENCOUNTER — Encounter (INDEPENDENT_AMBULATORY_CARE_PROVIDER_SITE_OTHER): Payer: Medicare Other | Admitting: Ophthalmology

## 2014-02-13 DIAGNOSIS — H34832 Tributary (branch) retinal vein occlusion, left eye: Secondary | ICD-10-CM

## 2014-02-13 DIAGNOSIS — H43813 Vitreous degeneration, bilateral: Secondary | ICD-10-CM | POA: Diagnosis not present

## 2014-02-13 DIAGNOSIS — H338 Other retinal detachments: Secondary | ICD-10-CM

## 2014-02-13 DIAGNOSIS — I10 Essential (primary) hypertension: Secondary | ICD-10-CM

## 2014-02-13 DIAGNOSIS — H35033 Hypertensive retinopathy, bilateral: Secondary | ICD-10-CM

## 2014-02-13 NOTE — Progress Notes (Signed)
Seen by dr Al Corpus ,  Pulse 90  o2 sat 95   bp 157/73  Cleared for surgery

## 2014-02-13 NOTE — Consult Note (Signed)
Pt seen today in preparation for surgery on 02/14/14.  He can walk up 3 flights of stairs without being SOB. SpO2 today on room air varied between 95-99%.  Regular heart rhythm and clear lungs were noted.  He has had no further episodes of Afib by his history. BP 157/73.  He is hoping to have a block for his case but understands he may need to have a general anesthetic as well. I think he will tolerate the surgery reasonably well.

## 2014-02-14 ENCOUNTER — Ambulatory Visit (HOSPITAL_BASED_OUTPATIENT_CLINIC_OR_DEPARTMENT_OTHER): Payer: Medicare Other | Admitting: Anesthesiology

## 2014-02-14 ENCOUNTER — Encounter (HOSPITAL_BASED_OUTPATIENT_CLINIC_OR_DEPARTMENT_OTHER)
Admission: RE | Disposition: A | Discharge status: Home / Self Care | Payer: Self-pay | Source: Ambulatory Visit | Attending: Orthopedic Surgery

## 2014-02-14 ENCOUNTER — Ambulatory Visit (HOSPITAL_BASED_OUTPATIENT_CLINIC_OR_DEPARTMENT_OTHER)
Admission: RE | Admit: 2014-02-14 | Discharge: 2014-02-14 | Disposition: A | Discharge status: Home / Self Care | Payer: Medicare Other | Source: Ambulatory Visit | Attending: Orthopedic Surgery | Admitting: Orthopedic Surgery

## 2014-02-14 ENCOUNTER — Encounter (HOSPITAL_BASED_OUTPATIENT_CLINIC_OR_DEPARTMENT_OTHER): Payer: Self-pay

## 2014-02-14 DIAGNOSIS — M12842 Other specific arthropathies, not elsewhere classified, left hand: Secondary | ICD-10-CM | POA: Diagnosis not present

## 2014-02-14 DIAGNOSIS — F419 Anxiety disorder, unspecified: Secondary | ICD-10-CM | POA: Diagnosis not present

## 2014-02-14 DIAGNOSIS — M654 Radial styloid tenosynovitis [de Quervain]: Secondary | ICD-10-CM | POA: Diagnosis not present

## 2014-02-14 DIAGNOSIS — I10 Essential (primary) hypertension: Secondary | ICD-10-CM | POA: Insufficient documentation

## 2014-02-14 DIAGNOSIS — E119 Type 2 diabetes mellitus without complications: Secondary | ICD-10-CM | POA: Diagnosis not present

## 2014-02-14 DIAGNOSIS — M19041 Primary osteoarthritis, right hand: Secondary | ICD-10-CM | POA: Diagnosis not present

## 2014-02-14 DIAGNOSIS — Z87891 Personal history of nicotine dependence: Secondary | ICD-10-CM | POA: Diagnosis not present

## 2014-02-14 DIAGNOSIS — F329 Major depressive disorder, single episode, unspecified: Secondary | ICD-10-CM | POA: Diagnosis not present

## 2014-02-14 DIAGNOSIS — G4733 Obstructive sleep apnea (adult) (pediatric): Secondary | ICD-10-CM | POA: Insufficient documentation

## 2014-02-14 DIAGNOSIS — M79645 Pain in left finger(s): Secondary | ICD-10-CM | POA: Diagnosis present

## 2014-02-14 DIAGNOSIS — K219 Gastro-esophageal reflux disease without esophagitis: Secondary | ICD-10-CM | POA: Diagnosis not present

## 2014-02-14 DIAGNOSIS — I2510 Atherosclerotic heart disease of native coronary artery without angina pectoris: Secondary | ICD-10-CM | POA: Diagnosis not present

## 2014-02-14 DIAGNOSIS — I481 Persistent atrial fibrillation: Secondary | ICD-10-CM | POA: Insufficient documentation

## 2014-02-14 DIAGNOSIS — Z85828 Personal history of other malignant neoplasm of skin: Secondary | ICD-10-CM | POA: Diagnosis not present

## 2014-02-14 DIAGNOSIS — M1812 Unilateral primary osteoarthritis of first carpometacarpal joint, left hand: Secondary | ICD-10-CM | POA: Diagnosis not present

## 2014-02-14 DIAGNOSIS — E785 Hyperlipidemia, unspecified: Secondary | ICD-10-CM | POA: Insufficient documentation

## 2014-02-14 DIAGNOSIS — Z951 Presence of aortocoronary bypass graft: Secondary | ICD-10-CM | POA: Insufficient documentation

## 2014-02-14 HISTORY — PX: CARPOMETACARPEL SUSPENSION PLASTY: SHX5005

## 2014-02-14 LAB — GLUCOSE, CAPILLARY
Glucose-Capillary: 166 mg/dL — ABNORMAL HIGH (ref 70–99)
Glucose-Capillary: 147 mg/dL — ABNORMAL HIGH (ref 70–99)

## 2014-02-14 SURGERY — CARPOMETACARPEL (CMC) SUSPENSION PLASTY
Anesthesia: Monitor Anesthesia Care | Site: Wrist | Laterality: Left

## 2014-02-14 MED ORDER — LIDOCAINE HCL (CARDIAC) 20 MG/ML IV SOLN
INTRAVENOUS | Status: DC | PRN
  Administered 2014-02-14: 20 mg via INTRAVENOUS

## 2014-02-14 MED ORDER — LACTATED RINGERS IV SOLN
INTRAVENOUS | Status: DC
  Administered 2014-02-14 (×2): via INTRAVENOUS

## 2014-02-14 MED ORDER — ONDANSETRON HCL 4 MG/2ML IJ SOLN: 4.0000 mg | Freq: Four times a day (QID) | INTRAMUSCULAR | Status: DC | PRN

## 2014-02-14 MED ORDER — OXYCODONE HCL 5 MG PO TABS: 5.0000 mg | ORAL_TABLET | Freq: Once | ORAL | Status: DC | PRN

## 2014-02-14 MED ORDER — MIDAZOLAM HCL 2 MG/2ML IJ SOLN
INTRAMUSCULAR | Status: AC
  Filled 2014-02-14: qty 2

## 2014-02-14 MED ORDER — MIDAZOLAM HCL 2 MG/2ML IJ SOLN
1.0000 mg | INTRAMUSCULAR | Status: DC | PRN
  Administered 2014-02-14: 1 mg via INTRAVENOUS

## 2014-02-14 MED ORDER — FENTANYL CITRATE 0.05 MG/ML IJ SOLN
50.0000 ug | INTRAMUSCULAR | Status: DC | PRN
  Administered 2014-02-14: 50 ug via INTRAVENOUS

## 2014-02-14 MED ORDER — FENTANYL CITRATE 0.05 MG/ML IJ SOLN
INTRAMUSCULAR | Status: AC
  Filled 2014-02-14: qty 6

## 2014-02-14 MED ORDER — FENTANYL CITRATE 0.05 MG/ML IJ SOLN
INTRAMUSCULAR | Status: AC
  Filled 2014-02-14: qty 2

## 2014-02-14 MED ORDER — CEFAZOLIN SODIUM-DEXTROSE 2-3 GM-% IV SOLR
INTRAVENOUS | Status: AC
  Filled 2014-02-14: qty 50

## 2014-02-14 MED ORDER — CEFAZOLIN SODIUM-DEXTROSE 2-3 GM-% IV SOLR
2.0000 g | INTRAVENOUS | Status: AC
  Administered 2014-02-14: 2 g via INTRAVENOUS

## 2014-02-14 MED ORDER — PROPOFOL INFUSION 10 MG/ML OPTIME
INTRAVENOUS | Status: DC | PRN
  Administered 2014-02-14: 50 ug/kg/min via INTRAVENOUS

## 2014-02-14 MED ORDER — 0.9 % SODIUM CHLORIDE (POUR BTL) OPTIME
TOPICAL | Status: DC | PRN
  Administered 2014-02-14: 200 mL

## 2014-02-14 MED ORDER — CHLORHEXIDINE GLUCONATE 4 % EX LIQD: 60.0000 mL | Freq: Once | CUTANEOUS | Status: DC

## 2014-02-14 MED ORDER — PROPOFOL 10 MG/ML IV EMUL
INTRAVENOUS | Status: AC
  Filled 2014-02-14: qty 50

## 2014-02-14 MED ORDER — BUPIVACAINE HCL (PF) 0.25 % IJ SOLN
INTRAMUSCULAR | Status: AC
  Filled 2014-02-14: qty 30

## 2014-02-14 MED ORDER — FENTANYL CITRATE 0.05 MG/ML IJ SOLN: 25.0000 ug | INTRAMUSCULAR | Status: DC | PRN

## 2014-02-14 MED ORDER — BUPIVACAINE-EPINEPHRINE (PF) 0.5% -1:200000 IJ SOLN
INTRAMUSCULAR | Status: DC | PRN
  Administered 2014-02-14: 30 mL via PERINEURAL

## 2014-02-14 MED ORDER — OXYCODONE HCL 5 MG/5ML PO SOLN: 5.0000 mg | Freq: Once | ORAL | Status: DC | PRN

## 2014-02-14 MED ORDER — ONDANSETRON HCL 4 MG/2ML IJ SOLN
INTRAMUSCULAR | Status: DC | PRN
  Administered 2014-02-14: 4 mg via INTRAVENOUS

## 2014-02-14 MED ORDER — OXYCODONE-ACETAMINOPHEN 5-325 MG PO TABS: 1.0000 | ORAL_TABLET | ORAL | Status: DC | PRN

## 2014-02-14 SURGICAL SUPPLY — 66 items
APL SKNCLS STERI-STRIP NONHPOA (GAUZE/BANDAGES/DRESSINGS) ×1
BAG DECANTER FOR FLEXI CONT (MISCELLANEOUS) IMPLANT
BANDAGE ELASTIC 4 VELCRO ST LF (GAUZE/BANDAGES/DRESSINGS) ×3 IMPLANT
BENZOIN TINCTURE PRP APPL 2/3 (GAUZE/BANDAGES/DRESSINGS) ×3 IMPLANT
BLADE MINI RND TIP GREEN BEAV (BLADE) ×2 IMPLANT
BLADE SURG 15 STRL LF DISP TIS (BLADE) ×1 IMPLANT
BLADE SURG 15 STRL SS (BLADE) ×3
BNDG CMPR 9X4 STRL LF SNTH (GAUZE/BANDAGES/DRESSINGS) ×1
BNDG CMPR MD 5X2 ELC HKLP STRL (GAUZE/BANDAGES/DRESSINGS)
BNDG ELASTIC 2 VLCR STRL LF (GAUZE/BANDAGES/DRESSINGS) IMPLANT
BNDG ESMARK 4X9 LF (GAUZE/BANDAGES/DRESSINGS) ×3 IMPLANT
BNDG GAUZE ELAST 4 BULKY (GAUZE/BANDAGES/DRESSINGS) ×3 IMPLANT
CLOSURE WOUND 1/2 X4 (GAUZE/BANDAGES/DRESSINGS) ×1
CORDS BIPOLAR (ELECTRODE) ×3 IMPLANT
COVER BACK TABLE 60X90IN (DRAPES) ×3 IMPLANT
CUFF TOURNIQUET SINGLE 18IN (TOURNIQUET CUFF) ×2 IMPLANT
DECANTER SPIKE VIAL GLASS SM (MISCELLANEOUS) IMPLANT
DRAPE EXTREMITY T 121X128X90 (DRAPE) ×3 IMPLANT
DRAPE OEC MINIVIEW 54X84 (DRAPES) ×2 IMPLANT
DRAPE SURG 17X23 STRL (DRAPES) ×3 IMPLANT
DURAPREP 26ML APPLICATOR (WOUND CARE) ×3 IMPLANT
GAUZE SPONGE 4X4 12PLY STRL (GAUZE/BANDAGES/DRESSINGS) ×3 IMPLANT
GAUZE SPONGE 4X4 16PLY XRAY LF (GAUZE/BANDAGES/DRESSINGS) IMPLANT
GAUZE XEROFORM 1X8 LF (GAUZE/BANDAGES/DRESSINGS) ×2 IMPLANT
GLOVE BIOGEL PI IND STRL 7.0 (GLOVE) IMPLANT
GLOVE BIOGEL PI INDICATOR 7.0 (GLOVE) ×2
GLOVE ECLIPSE 6.5 STRL STRAW (GLOVE) ×2 IMPLANT
GLOVE SURG SYN 8.0 (GLOVE) ×3 IMPLANT
GLOVE SURG SYN 8.0 PF PI (GLOVE) ×2 IMPLANT
GOWN STRL REUS W/ TWL LRG LVL3 (GOWN DISPOSABLE) ×1 IMPLANT
GOWN STRL REUS W/TWL LRG LVL3 (GOWN DISPOSABLE) ×6
GOWN STRL REUS W/TWL XL LVL3 (GOWN DISPOSABLE) ×2 IMPLANT
K-WIRE .035X4 (WIRE) ×2 IMPLANT
KIT ASCP FXDISP 3X8XBTNDS (KITS) IMPLANT
KIT BIO-TENODESIS 3X8 DISP (KITS) ×3
NDL HYPO 25X1 1.5 SAFETY (NEEDLE) IMPLANT
NEEDLE HYPO 25X1 1.5 SAFETY (NEEDLE) IMPLANT
NS IRRIG 1000ML POUR BTL (IV SOLUTION) ×3 IMPLANT
PACK BASIN DAY SURGERY FS (CUSTOM PROCEDURE TRAY) ×3 IMPLANT
PAD CAST 3X4 CTTN HI CHSV (CAST SUPPLIES) ×1 IMPLANT
PADDING CAST ABS 3INX4YD NS (CAST SUPPLIES)
PADDING CAST ABS 4INX4YD NS (CAST SUPPLIES) ×2
PADDING CAST ABS COTTON 3X4 (CAST SUPPLIES) IMPLANT
PADDING CAST ABS COTTON 4X4 ST (CAST SUPPLIES) ×1 IMPLANT
PADDING CAST COTTON 3X4 STRL (CAST SUPPLIES) ×3
PADDING UNDERCAST 2 STRL (CAST SUPPLIES)
PADDING UNDERCAST 2X4 STRL (CAST SUPPLIES) IMPLANT
PASSER SUT SWANSON 36MM LOOP (INSTRUMENTS) ×3 IMPLANT
SCREW BIOCOM TENODESIS 4X10M (Screw) ×4 IMPLANT
SHEET MEDIUM DRAPE 40X70 STRL (DRAPES) ×3 IMPLANT
SLING ARM LRG ADULT FOAM STRAP (SOFTGOODS) ×2 IMPLANT
SPLINT PLASTER CAST XFAST 4X15 (CAST SUPPLIES) ×5 IMPLANT
SPLINT PLASTER XTRA FAST SET 4 (CAST SUPPLIES) ×24
STOCKINETTE 4X48 STRL (DRAPES) ×3 IMPLANT
STRIP CLOSURE SKIN 1/2X4 (GAUZE/BANDAGES/DRESSINGS) ×2 IMPLANT
SUT ETHILON 3 0 PS 1 (SUTURE) IMPLANT
SUT FIBERWIRE 2-0 18 17.9 3/8 (SUTURE)
SUT PROLENE 3 0 PS 2 (SUTURE) ×2 IMPLANT
SUT VICRYL 4-0 PS2 18IN ABS (SUTURE) ×4 IMPLANT
SUT VICRYL RAPIDE 4-0 (SUTURE) IMPLANT
SUT VICRYL RAPIDE 4/0 PS 2 (SUTURE) IMPLANT
SUTURE FIBERWR 2-0 18 17.9 3/8 (SUTURE) IMPLANT
SYR BULB 3OZ (MISCELLANEOUS) ×3 IMPLANT
SYRINGE 10CC LL (SYRINGE) ×1 IMPLANT
TOWEL OR 17X24 6PK STRL BLUE (TOWEL DISPOSABLE) ×5 IMPLANT
UNDERPAD 30X30 INCONTINENT (UNDERPADS AND DIAPERS) ×3 IMPLANT

## 2014-02-14 NOTE — Op Note (Signed)
See note [id number]

## 2014-02-14 NOTE — Progress Notes (Signed)
Assisted Dr. Marcie Bal with left, ultrasound guided, infraclavicular block. Side rails up, monitors on throughout procedure. See vital signs in flow sheet. Tolerated Procedure well.

## 2014-02-14 NOTE — Anesthesia Procedure Notes (Addendum)
Procedure Name: MAC Date/Time: 02/14/2014 8:49 AM Performed by: Baxter Flattery Pre-anesthesia Checklist: Patient identified, Emergency Drugs available, Suction available and Patient being monitored Patient Re-evaluated:Patient Re-evaluated prior to inductionOxygen Delivery Method: Simple face mask Preoxygenation: Pre-oxygenation with 100% oxygen Intubation Type: IV induction   Anesthesia Regional Block:  Infraclavicular brachial plexus block  Pre-Anesthetic Checklist: ,, timeout performed, Correct Patient, Correct Site, Correct Laterality, Correct Procedure, Correct Position, site marked, Risks and benefits discussed,  Surgical consent,  Pre-op evaluation,  At surgeon's request and post-op pain management  Laterality: Left  Prep: chloraprep       Needles:  Injection technique: Single-shot  Needle Type: Echogenic Stimulator Needle     Needle Length: 9cm 9 cm Needle Gauge: 21 and 21 G    Additional Needles:  Procedures: ultrasound guided (picture in chart) and nerve stimulator Infraclavicular brachial plexus block  Nerve Stimulator or Paresthesia:  Response: biceps flexion, 0.45 mA,   Additional Responses:   Narrative:  Start time: 02/14/2014 8:04 AM End time: 02/14/2014 8:16 AM Injection made incrementally with aspirations every 5 mL.  Performed by: Personally  Anesthesiologist: Tai Skelly  Additional Notes: Functioning IV was confirmed and monitors were applied.  A 29mm 21ga Arrow echogenic stimulator needle was used. Sterile prep and drape,hand hygiene and sterile gloves were used.  Negative aspiration and negative test dose prior to incremental administration of local anesthetic. The patient tolerated the procedure well.  Ultrasound guidance: relevent anatomy identified, needle position confirmed, local anesthetic spread visualized around nerve(s), vascular puncture avoided.  Image printed for medical record.

## 2014-02-14 NOTE — Transfer of Care (Signed)
Immediate Anesthesia Transfer of Care Note  Patient: [name]  Procedure(s) Performed: Procedure(s): CARPOMETACARPEL ([place]) SUSPENSIONPLASTY THUMB  WITH  ABDUCTOR POLLICIS LONGUS TRANSFER AND STENOSING TENOSYNOVITIS RELEASE LEFT WRIST (Left)  Patient Location: PACU  Anesthesia Type:MAC combined with regional for post-op pain  Level of Consciousness: awake, alert , oriented and patient cooperative  Airway & Oxygen Therapy: Patient Spontanous Breathing and Patient connected to face mask oxygen  Post-op Assessment: Report given to RN, Post -op Vital signs reviewed and stable and Patient moving all extremities  Post vital signs: Reviewed and stable  Last Vitals:  Filed Vitals:   02/14/14 0840  BP:   Pulse: 77  Temp:   Resp: 17    Complications: No apparent anesthesia complications

## 2014-02-14 NOTE — Anesthesia Preprocedure Evaluation (Signed)
Anesthesia Evaluation  Patient identified by MRN, date of birth, ID band Patient awake    Reviewed: Allergy & Precautions, NPO status , Patient's Chart, lab work & pertinent test results  Airway Mallampati: II   Neck ROM: full    Dental   Pulmonary sleep apnea , former smoker,          Cardiovascular hypertension, + CAD, + CABG and +CHF + dysrhythmias Atrial Fibrillation  S/p MAZE procedure   Neuro/Psych Anxiety Depression    GI/Hepatic hiatal hernia, GERD-  ,  Endo/Other  diabetes, Type 2  Renal/GU      Musculoskeletal  (+) Arthritis -,   Abdominal   Peds  Hematology   Anesthesia Other Findings   Reproductive/Obstetrics                             Anesthesia Physical Anesthesia Plan  ASA: III  Anesthesia Plan: MAC and Regional   Post-op Pain Management: MAC Combined w/ Regional for Post-op pain   Induction: Intravenous  Airway Management Planned:   Additional Equipment:   Intra-op Plan:   Post-operative Plan:   Informed Consent: I have reviewed the patients History and Physical, chart, labs and discussed the procedure including the risks, benefits and alternatives for the proposed anesthesia with the patient or authorized representative who has indicated his/her understanding and acceptance.     Plan Discussed with: CRNA, Anesthesiologist and Surgeon  Anesthesia Plan Comments:         Anesthesia Quick Evaluation

## 2014-02-14 NOTE — Discharge Instructions (Signed)
Regional Anesthesia Blocks ° °1. Numbness or the inability to move the "blocked" extremity may last from 3-48 hours after placement. The length of time depends on the medication injected and your individual response to the medication. If the numbness is not going away after 48 hours, call your surgeon. ° °2. The extremity that is blocked will need to be protected until the numbness is gone and the  Strength has returned. Because you cannot feel it, you will need to take extra care to avoid injury. Because it may be weak, you may have difficulty moving it or using it. You may not know what position it is in without looking at it while the block is in effect. ° °3. For blocks in the legs and feet, returning to weight bearing and walking needs to be done carefully. You will need to wait until the numbness is entirely gone and the strength has returned. You should be able to move your leg and foot normally before you try and bear weight or walk. You will need someone to be with you when you first try to ensure you do not fall and possibly risk injury. ° °4. Bruising and tenderness at the needle site are common side effects and will resolve in a few days. ° °5. Persistent numbness or new problems with movement should be communicated to the surgeon or the [facility] ([phone])/ [facility] ([phone]). ° ° ° °Post Anesthesia Home Care Instructions ° °Activity: °Get plenty of rest for the remainder of the day. A responsible adult should stay with you for 24 hours following the procedure.  °For the next 24 hours, DO NOT: °-Drive a car °-Operate machinery °-Drink alcoholic beverages °-Take any medication unless instructed by your physician °-Make any legal decisions or sign important papers. ° °Meals: °Start with liquid foods such as gelatin or soup. Progress to regular foods as tolerated. Avoid greasy, spicy, heavy foods. If nausea and/or vomiting occur, drink only clear liquids until the  nausea and/or vomiting subsides. Call your physician if vomiting continues. ° °Special Instructions/Symptoms: °Your throat may feel dry or sore from the anesthesia or the breathing tube placed in your throat during surgery. If this causes discomfort, gargle with warm salt water. The discomfort should disappear within 24 hours. ° °

## 2014-02-14 NOTE — Anesthesia Postprocedure Evaluation (Signed)
Anesthesia Post Note  Patient: [name]  Procedure(s) Performed: Procedure(s) (LRB): CARPOMETACARPEL ([place]) SUSPENSIONPLASTY THUMB  WITH  ABDUCTOR POLLICIS LONGUS TRANSFER AND STENOSING TENOSYNOVITIS RELEASE LEFT WRIST (Left)  Anesthesia type: MAC and nerve block  Patient location: PACU  Post pain: Pain level controlled and Adequate analgesia  Post assessment: Post-op Vital signs reviewed, Patient's Cardiovascular Status Stable and Respiratory Function Stable  Last Vitals:  Filed Vitals:   02/14/14 1115  BP: 129/72  Pulse: 75  Temp:   Resp: 19    Post vital signs: Reviewed and stable  Level of consciousness: awake, alert  and oriented  Complications: No apparent anesthesia complications

## 2014-02-14 NOTE — H&P (Signed)
[name] is an 74 y.o. male.   Chief Complaint: left thumb pain HPI: as above with h/o chronic left thumb pain with xr positive for cmc djd  Past Medical History  Diagnosis Date  . Hx of adenomatous colonic polyps   . Diabetes mellitus type II   . Hypertension   . Diverticulosis 2001  . Hyperlipidemia   . Persistent atrial fibrillation     a. s/p MAZE 04/2013 in setting of CABG. b. Amio stopped in 10/2013 after flutter ablation.  . Obstructive sleep apnea     compliant with CPAP  . Chronic diastolic congestive heart failure   . Hypertensive cardiomyopathy   . History of cardioversion     x3 (years uncertain)  . H/O hiatal hernia   . Basal cell carcinoma   . Adrenal adenoma   . CAD (coronary artery disease)     a. 04/2013 CABG x 2: LIMA to LAD, SVG to RI, EVH via R thigh.  . S/P Maze operation for atrial fibrillation     a. 04/2013: Complete bilateral atrial lesion set using cryothermy and bipolar radiofrequency ablation with clipping of LA appendage (@ time of CABG)  . GERD (gastroesophageal reflux disease)   . Depression   . Anxiety   . DJD (degenerative joint disease)   . 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 DCCV 08/2013. b. s/p RFA 10/2013.  [name] Respiratory failure     a. Hypoxia 10/2013 - required supp O2 as inpatient, did not require it at discharge.  . Pleural effusion, left     a. s/p thoracentesis 05/2013.  [name] PFO (patent foramen ovale)     a. Small PFO by TEE 10/2013.    Past Surgical History  Procedure Laterality Date  . Polinydal cyst      Removed  . Great toe arthrodesis, interphalangeal joint      Right foot  . Retina repair-right    . Cataract extraction      bilateral  . Basal cell carcinoma excision      x3 on face  . Polypectomy    . Cardiac catheterization      myocardial bridge but no cad  . Coronary artery bypass graft N/A 04/05/2013    Procedure: CORONARY ARTERY BYPASS GRAFTING (CABG) TIMES TWO USING LEFT INTERNAL MAMMARY ARTERY AND RIGHT  SAPHENOUS LEG VEIN HARVESTED ENDOSCOPICALLY;  Surgeon: Rexene Alberts, MD;  Location: [place];  Service: Open Heart Surgery;  Laterality: N/A;  . Maze N/A 04/05/2013    Procedure: MAZE;  Surgeon: Rexene Alberts, MD;  Location: [place];  Service: Open Heart Surgery;  Laterality: N/A;  . Intraoperative transesophageal echocardiogram N/A 04/05/2013    Procedure: INTRAOPERATIVE TRANSESOPHAGEAL ECHOCARDIOGRAM;  Surgeon: Rexene Alberts, MD;  Location: [place];  Service: Open Heart Surgery;  Laterality: N/A;  . Tee without cardioversion N/A 08/23/2013    Procedure: TRANSESOPHAGEAL ECHOCARDIOGRAM (TEE);  Surgeon: Sanda Klein, MD;  Location: [place];  Service: Cardiovascular;  Laterality: N/A;  . Cardioversion N/A 08/23/2013    Procedure: CARDIOVERSION;  Surgeon: Sanda Klein, MD;  Location: [facility] ENDOSCOPY;  Service: Cardiovascular;  Laterality: N/A;  . Tee without cardioversion N/A 10/26/2013    Procedure: TRANSESOPHAGEAL ECHOCARDIOGRAM (TEE);  Surgeon: Sueanne Margarita, MD;  Location: [facility] ENDOSCOPY;  Service: Cardiovascular;  Laterality: N/A;  . Left heart catheterization with coronary angiogram N/A 03/07/2013    Procedure: LEFT HEART CATHETERIZATION WITH CORONARY ANGIOGRAM;  Surgeon: Burnell Blanks, MD;  Location: [facility] CATH  LAB;  Service: Cardiovascular;  Laterality: N/A;  . Atrial fibrillation ablation N/A 10/26/2013    Procedure: ATRIAL FIBRILLATION ABLATION;  Surgeon: Coralyn Mark, MD;  Location: [place] CATH LAB;  Service: Cardiovascular;  Laterality: N/A;    Family History  Problem Relation Age of Onset  . Colon cancer Mother     Family History/Uncle   . Colon polyps Mother     Family History  . Atrial fibrillation Mother   . Hypertension Mother   . Colon polyps Sister     Family history  . Diabetes Maternal Uncle   . Stroke Paternal Uncle   . Dementia Father    Social History:  reports that he quit smoking about 33 years ago. His smoking use included Cigarettes. He has a 100 pack-year  smoking history. He has never used smokeless tobacco. He reports that he drinks alcohol. He reports that he does not use illicit drugs.  Allergies:  Allergies  Allergen Reactions  . Sunflower Seed [Sunflower Oil] Swelling and Other (See Comments)    Tongue and lip swelling  . Horse-Derived Products Other (See Comments)    Based on skin test reaction  . Other Other (See Comments)    Tetanus Shot -- skin test reaction    Medications Prior to Admission  Medication Sig Dispense Refill  . acetaminophen (TYLENOL) 500 MG tablet Take 1,000 mg by mouth every 6 (six) hours as needed for mild pain.     [name] ALPRAZolam (XANAX) 1 MG tablet Take 1 mg by mouth at bedtime as needed for anxiety or sleep.    [name] atorvastatin (LIPITOR) 40 MG tablet Take 40 mg by mouth daily.    . B Complex Vitamins (VITAMIN B COMPLEX PO) Take 1 tablet by mouth daily.    [name] BESIVANCE 0.6 % SUSP Place 1 drop into the left eye 4 (four) times daily as needed (after injection).     . Cholecalciferol (VITAMIN D PO) Take 5,000 Units by mouth daily.    . cloNIDine (CATAPRES) 0.2 MG tablet Take 0.2 mg by mouth 2 (two) times daily.    . diphenhydrAMINE (BENADRYL) 25 MG tablet Take 25 mg by mouth every 6 (six) hours as needed for allergies.     . furosemide (LASIX) 40 MG tablet Take 2 tablets (80 mg total) by mouth daily. 60 tablet 6  . glimepiride (AMARYL) 2 MG tablet Take 2 mg by mouth daily with breakfast. If am fasting blood glucose is less than 120 hold amaryl dose.  For fasting blood glucose over 120 take 1/2 amaryl (1mg ).    . metFORMIN (GLUCOPHAGE-XR) 500 MG 24 hr tablet Take 1,000 mg by mouth 2 (two) times daily before a meal.     . minoxidil (LONITEN) 10 MG tablet Take 20 mg by mouth at bedtime.     . Multiple Vitamin (MULTIVITAMIN) tablet Take 1 tablet by mouth daily.    . potassium chloride SA (K-DUR,KLOR-CON) 20 MEQ tablet Take 2 tablets (40 mEq total) by mouth daily. 30 tablet 6  . ranitidine (ZANTAC) 150 MG tablet Take 150  mg by mouth daily as needed for heartburn.     . sertraline (ZOLOFT) 100 MG tablet TAKE 1 TABLET BY MOUTH DAILY 30 tablet 11  . valsartan (DIOVAN) 320 MG tablet Take 1 tablet (320 mg total) by mouth daily. 90 tablet 3  . warfarin (COUMADIN) 5 MG tablet Take 7.5 mg by mouth daily. On Sunday 10 mg  Rest of the rest of week 7.5 mg    .  HYDROcodone-acetaminophen (NORCO/VICODIN) 5-325 MG per tablet Take 1 tablet by mouth every 6 (six) hours as needed for moderate pain. 30 tablet     No results found for this or any previous visit (from the past 48 hour(s)). No results found.  Review of Systems  All other systems reviewed and are negative.   Blood pressure 143/80, pulse 76, temperature 98.4 F (36.9 C), temperature source Oral, resp. rate 20, height 5\' 11"  (1.803 m), weight 88.451 kg (195 lb), SpO2 99 %. Physical Exam  Constitutional: He is oriented to person, place, and time. He appears well-developed and well-nourished.  HENT:  Head: Normocephalic and atraumatic.  Cardiovascular: Normal rate.   Respiratory: Effort normal.  Musculoskeletal:       Left hand: He exhibits tenderness, bony tenderness and swelling.  Left thumb cmc djd with positive grind test  Neurological: He is alert and oriented to person, place, and time.  Skin: Skin is warm.  Psychiatric: He has a normal mood and affect. His behavior is normal. Judgment and thought content normal.     Assessment/Plan As above   Plan left thumb cmc aplasty with apl transfer and sts release  Linell Meldrum A 02/14/2014, 8:08 AM

## 2014-02-15 ENCOUNTER — Encounter (HOSPITAL_BASED_OUTPATIENT_CLINIC_OR_DEPARTMENT_OTHER): Payer: Self-pay | Admitting: Orthopedic Surgery

## 2014-02-16 NOTE — Op Note (Signed)
[name], [name] NO.:  [id number]  MEDICAL RECORD NO.:  [id number]  LOCATION:                                 FACILITY:  PHYSICIAN:  Sheral Apley. Linard Daft, M.D.DATE OF BIRTH:  28-Oct-1940  DATE OF PROCEDURE:  02/14/2014 DATE OF DISCHARGE:  02/14/2014                              OPERATIVE REPORT   PREOPERATIVE DIAGNOSIS:  Left thumb carpometacarpal arthropathy, de Quervain's tenosynovitis.  POSTOPERATIVE DIAGNOSIS:  Left thumb carpometacarpal arthropathy, de Quervain's tenosynovitis.  PROCEDURE:  Left thumb suspension plasty using APL tendon transfer and two 4 x 10 mm Bio-Tenodesis screws as well as de Quervain release, and tenosynovectomy, APL and APB.  SURGEON:  Sheral Apley. Burney Gauze, MD  ASSISTANT:  Marily Lente Dasnoit, PA  ANESTHESIA:  Axillary block and monitored anesthesia care.  COMPLICATION:  No complication.  DRAINS:  No drains.  Patient was taken to the operating suite.  After induction of adequate axillary block analgesia and light sedation, left upper extremity was prepped and draped in sterile fashion.  An Esmarch was used to exsanguinate the limb.  Tourniquet was inflated to 265 mmHg.  At this point in time, incision was made over the thumb metacarpal base, and first dorsal compartment area.  Skin was incised about 5-6 cm.  The first dorsal compartment was identified and released as dorsal most extent freeing up APL and EPB.  There was a separate sheath of EPB, this was incised.  We did a limited synovectomy of the APL and EPB tendons. Then, I carefully identified the snuffbox artery, retracted it dorsally, performed a CMC arthrotomy, did a subperiosteal dissection of thumb metacarpal base and trapezium.  The trapezium was then removed in piecemeal using curettes, osteotomes, and rongeurs.  Complete CMC synovectomy was performed.  Once we did this, we harvested the radial most slip of the APL tendon, transected it proximally at the  muscular tendon junction during the distal wound into the air around the thumb metacarpal base.  Next, using the Arthrex 4 x 10 mm Bio-Tenodesis screws set, we created transosseous canal and thumb metacarpal base exiting dorsally and distally 2 cm in the joint surface and in the midline of the joint surface.  We then retracted dorsally, identified the index metacarpal base and at the metaphyseal diaphyseal flare, created a drill hole using again the 4 mm Arthrex Bio-Tenodesis screw set.  Once we did that, we sutured the APL tendon, we passed it by a suture passer from the thumb metacarpal base dorsally out volarly.  We then took a [employer] forces training [employer] education officer, dropped it into the index metacarpal from dorsal to volar,  retrieved it and drew the tendon up into the index metacarpal base on the appropriate tension two 4 x 10 mm Bio-Tenodesis screws were placed to create the suspension.  The wound was then thoroughly irrigated.  The capsule was closed with 4-0 Vicryl and the skin with a 3-0 Prolene subcuticular stitch.  Steri-Strips, 4x4s, fluffs, and a radial gutter splint was applied.  The patient tolerated procedure well in a concealed fashion.     Sheral Apley Burney Gauze, M.D.     MAW/MEDQ  D:  02/14/2014  T:  02/14/2014  Job:  [id number]

## 2014-02-20 DIAGNOSIS — M1812 Unilateral primary osteoarthritis of first carpometacarpal joint, left hand: Secondary | ICD-10-CM | POA: Diagnosis not present

## 2014-03-05 ENCOUNTER — Ambulatory Visit (INDEPENDENT_AMBULATORY_CARE_PROVIDER_SITE_OTHER): Payer: Medicare Other | Admitting: Physician Assistant

## 2014-03-05 ENCOUNTER — Encounter: Payer: Self-pay | Admitting: Physician Assistant

## 2014-03-05 VITALS — BP 138/72 | HR 88 | Temp 98.6°F | Resp 16 | Ht 71.0 in | Wt 196.0 lb

## 2014-03-05 DIAGNOSIS — I480 Paroxysmal atrial fibrillation: Secondary | ICD-10-CM | POA: Diagnosis not present

## 2014-03-05 DIAGNOSIS — R350 Frequency of micturition: Secondary | ICD-10-CM

## 2014-03-05 DIAGNOSIS — Z7901 Long term (current) use of anticoagulants: Secondary | ICD-10-CM | POA: Diagnosis not present

## 2014-03-05 LAB — PROTIME-INR
INR: 1.5 — ABNORMAL HIGH
Prothrombin Time: 18.1 s — ABNORMAL HIGH (ref 11.6–15.2)

## 2014-03-05 NOTE — Patient Instructions (Signed)
Your PT/INR is the test we use to check your coumadin level.  Your goal for your INR is between 2-3.  If you number is below 2 than your blood is thick and you need more coumadin. You are at risk for stroke or clotting during this time period.  If you number is above 3 than your blood is too thin and you need less coumadin or can eat more greens at this time. You are at risk for stomach or head bleeds and need to be careful.   Shortness of Breath Shortness of breath means you have trouble breathing. It could also mean that you have a medical problem. You should get immediate medical care for shortness of breath. CAUSES   Not enough oxygen in the air such as with high altitudes or a smoke-filled room.  Certain lung diseases, infections, or problems.  Heart disease or conditions, such as angina or heart failure.  Low red blood cells (anemia).  Poor physical fitness, which can cause shortness of breath when you exercise.  Chest or back injuries or stiffness.  Being overweight.  Smoking.  Anxiety, which can make you feel like you are not getting enough air. DIAGNOSIS  Serious medical problems can often be found during your physical exam. Tests may also be done to determine why you are having shortness of breath. Tests may include:  Chest X-rays.  Lung function tests.  Blood tests.  An electrocardiogram (ECG).  An ambulatory electrocardiogram. An ambulatory ECG records your heartbeat patterns over a 24-hour period.  Exercise testing.  A transthoracic echocardiogram (TTE). During echocardiography, sound waves are used to evaluate how blood flows through your heart.  A transesophageal echocardiogram (TEE).  Imaging scans. Your health care provider may not be able to find a cause for your shortness of breath after your exam. In this case, it is important to have a follow-up exam with your health care provider as directed.  TREATMENT  Treatment for shortness of breath depends  on the cause of your symptoms and can vary greatly. HOME CARE INSTRUCTIONS   Do not smoke. Smoking is a common cause of shortness of breath. If you smoke, ask for help to quit.  Avoid being around chemicals or things that may bother your breathing, such as paint fumes and dust.  Rest as needed. Slowly resume your usual activities.  If medicines were prescribed, take them as directed for the full length of time directed. This includes oxygen and any inhaled medicines.  Keep all follow-up appointments as directed by your health care provider. SEEK MEDICAL CARE IF:   Your condition does not improve in the time expected.  You have a hard time doing your normal activities even with rest.  You have any new symptoms. SEEK IMMEDIATE MEDICAL CARE IF:   Your shortness of breath gets worse.  You feel light-headed, faint, or develop a cough not controlled with medicines.  You start coughing up blood.  You have pain with breathing.  You have chest pain or pain in your arms, shoulders, or abdomen.  You have a fever.  You are unable to walk up stairs or exercise the way you normally do. MAKE SURE YOU:  Understand these instructions.  Will watch your condition.  Will get help right away if you are not doing well or get worse. Document Released: 09/16/2000 Document Revised: 12/27/2012 Document Reviewed: 03/09/2011 [facility] Patient Information 2015 [place], [place]. This information is not intended to replace advice given to you by your health care  provider. Make sure you discuss any questions you have with your health care provider.

## 2014-03-05 NOTE — Progress Notes (Signed)
Coumadin follow up  Patient is on Coumadin for Primary Diagnosis: PAF (paroxysmal atrial fibrillation) [I48.0] Patient's last INR is  Lab Results  Component Value Date   INR 1.56* 01/30/2014   INR 2.0* 01/03/2014   INR 2.20* 11/27/2013    Patient denies  CP, dizziness, nose bleeds, easy bleeding, and blood in stool.  He has had some suprapubic pain last few days, and some painful urination last few days.  He has some SOB and palpitations without CP, dizziness, cough, wheezing, mucus.  His coumadin dose was not changed last visit. He has not taken ABX, has not missed any doses and denies a fall.   S/p 3 weeks left 1st metacarpal surgery with Dr. Oneta Rack. He stayed on his coumadin for the procedure.  Current Outpatient Prescriptions on File Prior to Visit  Medication Sig Dispense Refill  . acetaminophen (TYLENOL) 500 MG tablet Take 1,000 mg by mouth every 6 (six) hours as needed for mild pain.     [name] ALPRAZolam (XANAX) 1 MG tablet Take 1 mg by mouth at bedtime as needed for anxiety or sleep.    [name] atorvastatin (LIPITOR) 40 MG tablet Take 40 mg by mouth daily.    . B Complex Vitamins (VITAMIN B COMPLEX PO) Take 1 tablet by mouth daily.    [name] BESIVANCE 0.6 % SUSP Place 1 drop into the left eye 4 (four) times daily as needed (after injection).     . Cholecalciferol (VITAMIN D PO) Take 5,000 Units by mouth daily.    . cloNIDine (CATAPRES) 0.2 MG tablet Take 0.2 mg by mouth 2 (two) times daily.    . diphenhydrAMINE (BENADRYL) 25 MG tablet Take 25 mg by mouth every 6 (six) hours as needed for allergies.     . furosemide (LASIX) 40 MG tablet Take 2 tablets (80 mg total) by mouth daily. 60 tablet 6  . glimepiride (AMARYL) 2 MG tablet Take 2 mg by mouth daily with breakfast. If am fasting blood glucose is less than 120 hold amaryl dose.  For fasting blood glucose over 120 take 1/2 amaryl (1mg ).    [name] HYDROcodone-acetaminophen (NORCO/VICODIN) 5-325 MG per tablet Take 1 tablet by mouth every 6 (six) hours  as needed for moderate pain. 30 tablet   . metFORMIN (GLUCOPHAGE-XR) 500 MG 24 hr tablet Take 1,000 mg by mouth 2 (two) times daily before a meal.     . minoxidil (LONITEN) 10 MG tablet Take 20 mg by mouth at bedtime.     . Multiple Vitamin (MULTIVITAMIN) tablet Take 1 tablet by mouth daily.    . potassium chloride SA (K-DUR,KLOR-CON) 20 MEQ tablet Take 2 tablets (40 mEq total) by mouth daily. 30 tablet 6  . ranitidine (ZANTAC) 150 MG tablet Take 150 mg by mouth daily as needed for heartburn.     . sertraline (ZOLOFT) 100 MG tablet TAKE 1 TABLET BY MOUTH DAILY 30 tablet 11  . valsartan (DIOVAN) 320 MG tablet Take 1 tablet (320 mg total) by mouth daily. 90 tablet 3  . warfarin (COUMADIN) 5 MG tablet Take 7.5 mg by mouth daily. On Sunday 10 mg  Rest of the rest of week 7.5 mg    . oxyCODONE-acetaminophen (ROXICET) 5-325 MG per tablet Take 1 tablet by mouth every 4 (four) hours as needed for severe pain. (Patient not taking: Reported on 03/05/2014) 30 tablet 0   No current facility-administered medications on file prior to visit.   Past Medical History  Diagnosis Date  . Hx of  adenomatous colonic polyps   . Diabetes mellitus type II   . Hypertension   . Diverticulosis 2001  . Hyperlipidemia   . Persistent atrial fibrillation     a. s/p MAZE 04/2013 in setting of CABG. b. Amio stopped in 10/2013 after flutter ablation.  . Obstructive sleep apnea     compliant with CPAP  . Chronic diastolic congestive heart failure   . Hypertensive cardiomyopathy   . History of cardioversion     x3 (years uncertain)  . H/O hiatal hernia   . Basal cell carcinoma   . Adrenal adenoma   . CAD (coronary artery disease)     a. 04/2013 CABG x 2: LIMA to LAD, SVG to RI, EVH via R thigh.  . S/P Maze operation for atrial fibrillation     a. 04/2013: Complete bilateral atrial lesion set using cryothermy and bipolar radiofrequency ablation with clipping of LA appendage (@ time of CABG)  . GERD (gastroesophageal reflux  disease)   . Depression   . Anxiety   . DJD (degenerative joint disease)   . 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 DCCV 08/2013. b. s/p RFA 10/2013.  [name] Respiratory failure     a. Hypoxia 10/2013 - required supp O2 as inpatient, did not require it at discharge.  . Pleural effusion, left     a. s/p thoracentesis 05/2013.  [name] PFO (patent foramen ovale)     a. Small PFO by TEE 10/2013.   Allergies  Allergen Reactions  . Sunflower Seed [Sunflower Oil] Swelling and Other (See Comments)    Tongue and lip swelling  . Horse-Derived Products Other (See Comments)    Based on skin test reaction  . Other Other (See Comments)    Tetanus Shot -- skin test reaction    ROS Review of Systems  Constitutional: Negative.   HENT: Negative.   Respiratory: Positive for shortness of breath. Negative for cough, hemoptysis, sputum production and wheezing.   Cardiovascular: Positive for palpitations. Negative for chest pain, orthopnea, claudication, leg swelling and PND.  Gastrointestinal: Negative.   Genitourinary: Positive for dysuria. Negative for urgency, frequency, hematuria and flank pain.       Suprapubic pain  Musculoskeletal: Positive for joint pain (right hip). Negative for myalgias, back pain, falls and neck pain.  Skin: Negative.   Neurological: Negative.   Endo/Heme/Allergies: Negative for environmental allergies. Bruises/bleeds easily.  Psychiatric/Behavioral: Negative.    Physical: Blood pressure 138/72, pulse 88, temperature 98.6 F (37 C), resp. rate 16, height 5\' 11"  (1.803 m), weight 196 lb (88.905 kg). Filed Weights   03/05/14 1551  Weight: 196 lb (88.905 kg)    General Appearance: Well nourished, in no apparent distress. ENT/Mouth: Nares clear with no erythema, swelling, mucus on turbinates. No ulcers, cracking, on lips. No erythema, swelling, or exudate on post pharynx.  Neck: Supple, thyroid normal.  Respiratory: CTAB   Cardio:  RRR with holosystolic murmur, decreased  heart sounds RSB., rubs or gallops. No edema  Abdomen: Soft, with bowl sounds, obese Non tender, no guarding, rebound, hernias, masses, or organomegaly.  Skin: Warm, dry without rashes, lesions, ecchymosis.  Neuro: Unremarkable  Assessment and plan: Chronic anticoagulation- check INR and will adjust medication according to labs.  Discussed if patient falls to immediately contact office or go to ER. Discussed foods that can increase or decrease Coumadin levels. Patient understands to call the office before starting a new medication. Follow up in one month.   Urinary frequency- check CBC, BMP,  UA C&S

## 2014-03-06 LAB — CBC WITH DIFFERENTIAL/PLATELET
BASOS PCT: 0 % (ref 0–1)
Basophils Absolute: 0 10*3/uL (ref 0.0–0.1)
EOS ABS: 0.1 10*3/uL (ref 0.0–0.7)
EOS PCT: 1 % (ref 0–5)
HEMATOCRIT: 38.5 % — AB (ref 39.0–52.0)
Hemoglobin: 12.5 g/dL — ABNORMAL LOW (ref 13.0–17.0)
LYMPHS ABS: 0.9 10*3/uL (ref 0.7–4.0)
Lymphocytes Relative: 8 % — ABNORMAL LOW (ref 12–46)
MCH: 30.5 pg (ref 26.0–34.0)
MCHC: 32.5 g/dL (ref 30.0–36.0)
MCV: 93.9 fL (ref 78.0–100.0)
MONO ABS: 0.9 10*3/uL (ref 0.1–1.0)
MPV: 8.1 fL — ABNORMAL LOW (ref 8.6–12.4)
Monocytes Relative: 8 % (ref 3–12)
NEUTROS PCT: 83 % — AB (ref 43–77)
Neutro Abs: 9.6 10*3/uL — ABNORMAL HIGH (ref 1.7–7.7)
Platelets: 294 10*3/uL (ref 150–400)
RBC: 4.1 MIL/uL — ABNORMAL LOW (ref 4.22–5.81)
RDW: 14.3 % (ref 11.5–15.5)
WBC: 11.6 10*3/uL — ABNORMAL HIGH (ref 4.0–10.5)

## 2014-03-06 LAB — BASIC METABOLIC PANEL WITH GFR
BUN: 17 mg/dL (ref 6–23)
CO2: 27 meq/L (ref 19–32)
Calcium: 9.7 mg/dL (ref 8.4–10.5)
Chloride: 102 mEq/L (ref 96–112)
Creat: 0.96 mg/dL (ref 0.50–1.35)
GFR, Est Non African American: 78 mL/min
GLUCOSE: 189 mg/dL — AB (ref 70–99)
POTASSIUM: 3.9 meq/L (ref 3.5–5.3)
Sodium: 139 mEq/L (ref 135–145)

## 2014-03-06 LAB — URINALYSIS, ROUTINE W REFLEX MICROSCOPIC
Bilirubin Urine: NEGATIVE
Glucose, UA: NEGATIVE mg/dL
HGB URINE DIPSTICK: NEGATIVE
KETONES UR: NEGATIVE mg/dL
Leukocytes, UA: NEGATIVE
NITRITE: NEGATIVE
PROTEIN: NEGATIVE mg/dL
SPECIFIC GRAVITY, URINE: 1.02 (ref 1.005–1.030)
Urobilinogen, UA: 0.2 mg/dL (ref 0.0–1.0)
pH: 5 (ref 5.0–8.0)

## 2014-03-07 ENCOUNTER — Encounter: Payer: Self-pay | Admitting: Physician Assistant

## 2014-03-07 ENCOUNTER — Other Ambulatory Visit: Payer: Self-pay | Admitting: Physician Assistant

## 2014-03-07 LAB — URINE CULTURE
Colony Count: NO GROWTH
Organism ID, Bacteria: NO GROWTH

## 2014-03-07 MED ORDER — CIPROFLOXACIN HCL 250 MG PO TABS: 250.0000 mg | ORAL_TABLET | Freq: Two times a day (BID) | ORAL | Status: AC

## 2014-03-12 ENCOUNTER — Ambulatory Visit (INDEPENDENT_AMBULATORY_CARE_PROVIDER_SITE_OTHER): Payer: Medicare Other | Admitting: Cardiovascular Disease

## 2014-03-12 ENCOUNTER — Encounter: Payer: Self-pay | Admitting: Cardiovascular Disease

## 2014-03-12 VITALS — BP 108/60 | HR 92 | Ht 71.0 in | Wt 201.8 lb

## 2014-03-12 DIAGNOSIS — I119 Hypertensive heart disease without heart failure: Secondary | ICD-10-CM

## 2014-03-12 DIAGNOSIS — I429 Cardiomyopathy, unspecified: Secondary | ICD-10-CM

## 2014-03-12 DIAGNOSIS — I480 Paroxysmal atrial fibrillation: Secondary | ICD-10-CM | POA: Diagnosis not present

## 2014-03-12 DIAGNOSIS — I2510 Atherosclerotic heart disease of native coronary artery without angina pectoris: Secondary | ICD-10-CM

## 2014-03-12 DIAGNOSIS — I10 Essential (primary) hypertension: Secondary | ICD-10-CM | POA: Diagnosis not present

## 2014-03-12 DIAGNOSIS — I43 Cardiomyopathy in diseases classified elsewhere: Secondary | ICD-10-CM

## 2014-03-12 MED ORDER — CARVEDILOL 3.125 MG PO TABS: 3.1250 mg | ORAL_TABLET | Freq: Two times a day (BID) | ORAL | Status: DC

## 2014-03-12 MED ORDER — CLONIDINE HCL 0.1 MG PO TABS: 0.1000 mg | ORAL_TABLET | Freq: Two times a day (BID) | ORAL | Status: DC

## 2014-03-12 NOTE — Progress Notes (Signed)
Cardiology Office Note    Date:  03/12/2014   ID:  [name], DOB 1940-06-04, MRN [id number]  PCP:  Alesia Richards, MD  Cardiologist:   Thayer Headings, MD   Chief Complaint  Patient presents with  . Coronary Artery Disease  . Atrial Fibrillation   Problem List 1. Hypertension 2. HOCM 3. Aortic insuffuciency 4. Atrial fibrillation-failed Tikosyn, Has seen [facility] in [place], [place] .  5. Diabetes mellitus 6. 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 He has a hyperdynamic LV with systolic anterior motion of the mitral valve leaflet. 8. CABG and MAZE 04-05-13  History of Present Illness:  [name] is a 74 year old gentleman with a history of hypertension, atrial fibrillation and a myocardial bridge. He is a previous patient of Dr. Susa Simmonds. He also has a history of diabetes. He has felt fairly well. He has not had any episodes of chest pain or shortness of breath.   Nov. 25, 2013: He is very symptomatic with his Atrial fib. He gets very winded with walking. He has been on Eliquis and tikosyn 500 BID. 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 He was diagnosed with panic attack. Enzymes were normal. He is ready for his cardioversion.  Nov. 27, 2013: [name] was seen 2 days ago and was found to have atrial fibrillation. He was scheduled for cardioversion. When he showed up for cardioversion yesterday, he was in normal sinus rhythm.  He related to me that he had some medication changes over the past several weeks. He was previously on Tikosyn and citalopram and his atrial fibrillation seemed to be well-controlled on that. He's retired Software engineer and noted that the citalopram and Tikosyn interacted so his medical doctor changed him to Wellbutrin and stopped the citalopram. It was at that time that he started having increased palpitations, anxiety, and we noted him to be in atrial fibrillation. He then discontinued the Wellbutrin and  was started on Zoloft. He seems to be tolerating this combination. It was at that time that he showed up in the short stay Center and was found to have already converted to sinus rhythm.  May 31, 2012:  [name] is doing well. Still in NSR. No leg edema. He sticks to a low sodium diet. He has occasional CP - typically associated with arm / chest movement.   He wants to stop coumadin - interferes with his arthritis medications. He has maintained NSR since his last visit.   Nov. 24, 2014:  [name] is doing OK. He has some brief palps - he thinks they are PVCs. No sustained episodes of Afib. He has been getting Avastin injections into his eyes.   Jan. 12, 2015:  [name] is seen today as a work in visit for worsening ankle edema. He has not been able to associate his symptoms with change of diet. . he has also been noticing some increasing shortness of breath. He notes short or shortness of breath with any activity-example take the garbage cans out. He denies any PND. He uses a CPAP mask.   Jan. 26, 2015:  [name] is seen back today . He had CABG and MAZE on April 05, 2013 - slow recovery following surgery Had a left pleural tap ~ 4 weeks after surgery - 1.2 liters taken off - did much better after that  Had elective cardioversioin in early August, 2015.   [name] was seen 2 weeks ago with symptoms consistent with congestive heart failure. He  had been in persistent atrial fibrillation and so his tikosyn was stopped. He is still in atrial fibrillation. His rate is well controlled.   He had an echocardiogram: Left ventricle: Wall thickness was increased in a pattern of moderate LVH. Systolic function was vigorous. The estimated ejection fraction was in the range of 75% to 80%. Wall motion was normal; there were no regional wall motion abnormalities. - Aortic valve: Trivial regurgitation. - Left atrium: The atrium was mildly to moderately dilated. - Right ventricle: The  cavity size was mildly dilated. Systolic function was mildly reduced. - Right atrium: The atrium was moderately dilated. - Pulmonary arteries: Systolic pressure was mildly increased. PA peak pressure: 6mm Hg  He was found to have mild pulmonary hypertension.  He continues to have some dyspnea. He has ankle edema. His BP log shows mildly elevated BP.  May 26, 2013: [name] has had 2 V CABG and MAZE (April 05, 2013) since I last saw him. . He has done well He is having some tenderness along the right thigh SVG harvest site. Also c/o left 4th and 5th finger tingling / numbness.   He has otherwise done well. No cardiac complaints.   August 30, 2013:  He was cardioverted several weeks ago. After the cardioversion, his metoprolol was doubled to 25 BID, and diltizem was added   Oct. 2, 2015:  He has gone back into atrial fib. He has known left pleural effusion. He has had more dyspnea. Saw Dr. Melvyn Novas yesterday.  He is more lethargic. Has hypoxemia ( O2 sat of 88% with walking 50 feet)   Dec. 14, 2015:  [name] is followed for atrial fib. He has had a MAZE procedure which was unsuccessful. He had an A-fib ablation which has helped.  No A-fib. Swelling has resolved. Dyspnea has resolved. Doing well from a cardiac standpoint  Needs to have right thumb surgery. Also has bulging discs in his neck with pain radiation down his right arm/ shoulder   Dec. 30, 2015:  [name]'s BP has been elevated. He has had bronchitis and prednisone ( 2 rounds ) for the past several weeks.  Still has not had his thumb surgery.   March 12, 2014:   [name] is a 74 y.o. male who presents for follow-up of his coronary artery disease and atrial fibrillation.  He has had some DOE.  Was diagnosied with bronchitis several months ago.   thinks he had maintained NSR  No CP , sternotomy is healing up Has had his left thumb surgery   Past Medical History  Diagnosis Date  . Hx of  adenomatous colonic polyps   . Diabetes mellitus type II   . Hypertension   . Diverticulosis 2001  . Hyperlipidemia   . Persistent atrial fibrillation     a. s/p MAZE 04/2013 in setting of CABG. b. Amio stopped in 10/2013 after flutter ablation.  . Obstructive sleep apnea     compliant with CPAP  . Chronic diastolic congestive heart failure   . Hypertensive cardiomyopathy   . History of cardioversion     x3 (years uncertain)  . H/O hiatal hernia   . Basal cell carcinoma   . Adrenal adenoma   . CAD (coronary artery disease)     a. 04/2013 CABG x 2: LIMA to LAD, SVG to RI, EVH via R thigh.  . S/P Maze operation for atrial fibrillation     a. 04/2013: Complete bilateral atrial lesion set using cryothermy and bipolar radiofrequency ablation  with clipping of LA appendage (@ time of CABG)  . GERD (gastroesophageal reflux disease)   . Depression   . Anxiety   . DJD (degenerative joint disease)   . 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 DCCV 08/2013. b. s/p RFA 10/2013.  [name] Respiratory failure     a. Hypoxia 10/2013 - required supp O2 as inpatient, did not require it at discharge.  . Pleural effusion, left     a. s/p thoracentesis 05/2013.  [name] PFO (patent foramen ovale)     a. Small PFO by TEE 10/2013.    Past Surgical History  Procedure Laterality Date  . Polinydal cyst      Removed  . Great toe arthrodesis, interphalangeal joint      Right foot  . Retina repair-right    . Cataract extraction      bilateral  . Basal cell carcinoma excision      x3 on face  . Polypectomy    . Cardiac catheterization      myocardial bridge but no cad  . Coronary artery bypass graft N/A 04/05/2013    Procedure: CORONARY ARTERY BYPASS GRAFTING (CABG) TIMES TWO USING LEFT INTERNAL MAMMARY ARTERY AND RIGHT SAPHENOUS LEG VEIN HARVESTED ENDOSCOPICALLY;  Surgeon: Rexene Alberts, MD;  Location: [facility];  Service: Open Heart Surgery;  Laterality: N/A;  . Maze N/A 04/05/2013    Procedure: MAZE;  Surgeon:  Rexene Alberts, MD;  Location: [facility];  Service: Open Heart Surgery;  Laterality: N/A;  . Intraoperative transesophageal echocardiogram N/A 04/05/2013    Procedure: INTRAOPERATIVE TRANSESOPHAGEAL ECHOCARDIOGRAM;  Surgeon: Rexene Alberts, MD;  Location: [facility];  Service: Open Heart Surgery;  Laterality: N/A;  . Tee without cardioversion N/A 08/23/2013    Procedure: TRANSESOPHAGEAL ECHOCARDIOGRAM (TEE);  Surgeon: Sanda Klein, MD;  Location: [facility];  Service: Cardiovascular;  Laterality: N/A;  . Cardioversion N/A 08/23/2013    Procedure: CARDIOVERSION;  Surgeon: Sanda Klein, MD;  Location: [facility] ENDOSCOPY;  Service: Cardiovascular;  Laterality: N/A;  . Tee without cardioversion N/A 10/26/2013    Procedure: TRANSESOPHAGEAL ECHOCARDIOGRAM (TEE);  Surgeon: Sueanne Margarita, MD;  Location: [facility] ENDOSCOPY;  Service: Cardiovascular;  Laterality: N/A;  . Left heart catheterization with coronary angiogram N/A 03/07/2013    Procedure: LEFT HEART CATHETERIZATION WITH CORONARY ANGIOGRAM;  Surgeon: Burnell Blanks, MD;  Location: [facility] CATH LAB;  Service: Cardiovascular;  Laterality: N/A;  . Atrial fibrillation ablation N/A 10/26/2013    Procedure: ATRIAL FIBRILLATION ABLATION;  Surgeon: Coralyn Mark, MD;  Location: [facility] CATH LAB;  Service: Cardiovascular;  Laterality: N/A;  . Carpometacarpel suspension plasty Left 02/14/2014    Procedure: CARPOMETACARPEL ([place]) SUSPENSIONPLASTY THUMB  WITH  ABDUCTOR POLLICIS LONGUS TRANSFER AND STENOSING TENOSYNOVITIS RELEASE LEFT WRIST;  Surgeon: Charlotte Crumb, MD;  Location: [facility];  Service: Orthopedics;  Laterality: Left;     Current Outpatient Prescriptions  Medication Sig Dispense Refill  . acetaminophen (TYLENOL) 500 MG tablet Take 1,000 mg by mouth every 6 (six) hours as needed for mild pain.     [name] ALPRAZolam (XANAX) 1 MG tablet Take 1 mg by mouth at bedtime as needed for anxiety or sleep.    [name] atorvastatin (LIPITOR) 40 MG tablet Take 40 mg by  mouth daily.    . B Complex Vitamins (VITAMIN B COMPLEX PO) Take 1 tablet by mouth daily.    [name] BESIVANCE 0.6 % SUSP Place 1 drop into the left eye 4 (four) times daily as needed (  after injection).     . Cholecalciferol (VITAMIN D PO) Take 5,000 Units by mouth daily.    . ciprofloxacin (CIPRO) 250 MG tablet Take 1 tablet (250 mg total) by mouth 2 (two) times daily. 28 tablet 0  . cloNIDine (CATAPRES) 0.2 MG tablet Take 0.2 mg by mouth 2 (two) times daily.    . diphenhydrAMINE (BENADRYL) 25 MG tablet Take 25 mg by mouth every 6 (six) hours as needed for allergies.     . furosemide (LASIX) 40 MG tablet Take 2 tablets (80 mg total) by mouth daily. 60 tablet 6  . glimepiride (AMARYL) 2 MG tablet Take 2 mg by mouth daily with breakfast. If am fasting blood glucose is less than 120 hold amaryl dose.  For fasting blood glucose over 120 take 1/2 amaryl (1mg ).    . HYDROcodone-acetaminophen (NORCO/VICODIN) 5-325 MG per tablet Take 1 tablet by mouth every 6 (six) hours as needed for moderate pain. 30 tablet   . metFORMIN (GLUCOPHAGE-XR) 500 MG 24 hr tablet Take 1,000 mg by mouth 2 (two) times daily before a meal.     . minoxidil (LONITEN) 10 MG tablet Take 20 mg by mouth at bedtime.     . Multiple Vitamin (MULTIVITAMIN) tablet Take 1 tablet by mouth daily.    . potassium chloride SA (K-DUR,KLOR-CON) 20 MEQ tablet Take 2 tablets (40 mEq total) by mouth daily. 30 tablet 6  . ranitidine (ZANTAC) 150 MG tablet Take 150 mg by mouth daily as needed for heartburn.     . sertraline (ZOLOFT) 100 MG tablet TAKE 1 TABLET BY MOUTH DAILY 30 tablet 11  . valsartan (DIOVAN) 320 MG tablet Take 1 tablet (320 mg total) by mouth daily. 90 tablet 3  . warfarin (COUMADIN) 5 MG tablet Take 7.5 mg by mouth daily. On Sunday 10 mg  Rest of the rest of week 7.5 mg    . oxyCODONE-acetaminophen (ROXICET) 5-325 MG per tablet Take 1 tablet by mouth every 4 (four) hours as needed for severe pain. (Patient not taking: Reported on 03/05/2014)  30 tablet 0   No current facility-administered medications for this visit.    Allergies:   Sunflower seed; Horse-derived products; and Other    Social History:  The patient  reports that he quit smoking about 33 years ago. His smoking use included Cigarettes. He has a 100 pack-year smoking history. He has never used smokeless tobacco. He reports that he drinks alcohol. He reports that he does not use illicit drugs.   Family History:  The patient's family history includes Atrial fibrillation in his mother; Colon cancer in his mother; Colon polyps in his mother and sister; Dementia in his father; Diabetes in his maternal uncle; Hypertension in his mother; Stroke in his paternal uncle.    ROS:  Please see the history of present illness.    Review of Systems: Constitutional:  denies fever, chills, diaphoresis, appetite change and fatigue.  HEENT: denies photophobia, eye pain, redness, hearing loss, ear pain, congestion, sore throat, rhinorrhea, sneezing, neck pain, neck stiffness and tinnitus.  Respiratory: admits to   DOE,    Cardiovascular: denies chest pain, palpitations and leg swelling.  Gastrointestinal: denies nausea, vomiting, abdominal pain, diarrhea, constipation, blood in stool.  Genitourinary: denies dysuria, urgency, frequency, hematuria, flank pain and difficulty urinating.  Musculoskeletal: denies  myalgias, back pain, joint swelling, arthralgias and gait problem.   Skin: denies pallor, rash and wound.  Neurological: denies dizziness, seizures, syncope, weakness, light-headedness, numbness and headaches.  Hematological: denies adenopathy, easy bruising, personal or family bleeding history.  Psychiatric/ Behavioral: denies suicidal ideation, mood changes, confusion, nervousness, sleep disturbance and agitation.       All other systems are reviewed and negative.    PHYSICAL EXAM: VS:  BP 108/60 mmHg  Pulse 92  Ht 5\' 11"  (1.803 m)  Wt 201 lb 12.8 oz (91.536 kg)  BMI  28.16 kg/m2 , BMI Body mass index is 28.16 kg/(m^2). GEN: Well nourished, well developed, in no acute distress HEENT: normal Neck: no JVD, carotid bruits, or masses Cardiac: RRR; 2/6 systolic  Murmur,   Hyperdynamic PMI  rubs, or gallops,no edema  Respiratory:  clear to auscultation bilaterally, normal work of breathing GI: soft, nontender, nondistended, + BS MS: no deformity or atrophy Skin: warm and dry, no rash Neuro:  Strength and sensation are intact Psych: normal   EKG:  EKG is not ordered today.    Recent Labs: 11/06/2013: Pro B Natriuretic peptide (BNP) 235.0* 01/30/2014: ALT 14; Magnesium 1.8; TSH 1.629 03/05/2014: BUN 17; Creatinine 0.96; Hemoglobin 12.5*; Platelets 294; Potassium 3.9; Sodium 139    Lipid Panel    Component Value Date/Time   CHOL 161 01/30/2014 1214   TRIG 154* 01/30/2014 1214   HDL 42 01/30/2014 1214   CHOLHDL 3.8 01/30/2014 1214   VLDL 31 01/30/2014 1214   LDLCALC 88 01/30/2014 1214      Wt Readings from Last 3 Encounters:  03/12/14 201 lb 12.8 oz (91.536 kg)  03/05/14 196 lb (88.905 kg)  02/14/14 195 lb (88.451 kg)      Other studies Reviewed: Additional studies/ records that were reviewed today include: . Review of the above records demonstrates:    ASSESSMENT AND PLAN:  1. Hypertension- blood pressures fairly well-controlled. He's slightly hyperdynamic. I would like to start him on low-dose carvedilol. We have tried him on metoprolol in the past but he became too bradycardic. We will decrease his clonidine to 0.1 mg twice a day and start him on carvedilol 3.125 mg twice a day. I'll see me again in 2-3 weeks for follow-up visit.  2. HOCM - he has a systolic murmur consistent with hypertrophic obstructive cardio mildly. We will try adding low-dose carvedilol. 3. Aortic insuffuciency 4. Atrial fibrillation-failed Tikosyn, Has seen [facility] in [place], [place] .  So far he seems to be maintaining normal sinus rhythm. 5. Diabetes  mellitus 6. 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 He has a hyperdynamic LV with systolic anterior motion of the mitral valve leaflet. 8. CABG and MAZE 04-05-13 - no recent angina. He is healing quite nicely.   Current medicines are reviewed at length with the patient today.  The patient does not have concerns regarding medicines.  The following changes have been made:  See above.   Disposition:   FU with me in 2-3 weeks      Signed, Nahser, Wonda Cheng, MD  03/12/2014 9:54 AM    [facility] [place], [place], [place]  [address] Phone: [phone]; Fax: [phone]

## 2014-03-12 NOTE — Patient Instructions (Signed)
Please decrease your clonidine to 0.1 mg twice daily  Please start Coreg 3.125 mg twice daily  Your physician recommends that you schedule a follow-up appointment in: 2-3 weeks

## 2014-03-20 ENCOUNTER — Encounter (INDEPENDENT_AMBULATORY_CARE_PROVIDER_SITE_OTHER): Payer: Medicare Other | Admitting: Ophthalmology

## 2014-03-20 ENCOUNTER — Ambulatory Visit (INDEPENDENT_AMBULATORY_CARE_PROVIDER_SITE_OTHER): Payer: Medicare Other | Admitting: *Deleted

## 2014-03-20 DIAGNOSIS — H43813 Vitreous degeneration, bilateral: Secondary | ICD-10-CM | POA: Diagnosis not present

## 2014-03-20 DIAGNOSIS — Z79899 Other long term (current) drug therapy: Secondary | ICD-10-CM | POA: Diagnosis not present

## 2014-03-20 DIAGNOSIS — Z7901 Long term (current) use of anticoagulants: Secondary | ICD-10-CM | POA: Diagnosis not present

## 2014-03-20 DIAGNOSIS — H34832 Tributary (branch) retinal vein occlusion, left eye: Secondary | ICD-10-CM

## 2014-03-20 DIAGNOSIS — I10 Essential (primary) hypertension: Secondary | ICD-10-CM | POA: Diagnosis not present

## 2014-03-20 DIAGNOSIS — H35033 Hypertensive retinopathy, bilateral: Secondary | ICD-10-CM | POA: Diagnosis not present

## 2014-03-20 DIAGNOSIS — H338 Other retinal detachments: Secondary | ICD-10-CM | POA: Diagnosis not present

## 2014-03-20 LAB — CBC WITH DIFFERENTIAL/PLATELET
Basophils Absolute: 0 10*3/uL (ref 0.0–0.1)
Basophils Relative: 0 % (ref 0–1)
Eosinophils Absolute: 0.1 10*3/uL (ref 0.0–0.7)
Eosinophils Relative: 1 % (ref 0–5)
HEMATOCRIT: 35.2 % — AB (ref 39.0–52.0)
HEMOGLOBIN: 11.7 g/dL — AB (ref 13.0–17.0)
LYMPHS ABS: 0.8 10*3/uL (ref 0.7–4.0)
LYMPHS PCT: 12 % (ref 12–46)
MCH: 30.5 pg (ref 26.0–34.0)
MCHC: 33.2 g/dL (ref 30.0–36.0)
MCV: 91.7 fL (ref 78.0–100.0)
MONO ABS: 0.5 10*3/uL (ref 0.1–1.0)
MONOS PCT: 7 % (ref 3–12)
MPV: 8.5 fL — ABNORMAL LOW (ref 8.6–12.4)
NEUTROS ABS: 5.5 10*3/uL (ref 1.7–7.7)
NEUTROS PCT: 80 % — AB (ref 43–77)
Platelets: 214 10*3/uL (ref 150–400)
RBC: 3.84 MIL/uL — ABNORMAL LOW (ref 4.22–5.81)
RDW: 14.1 % (ref 11.5–15.5)
WBC: 6.9 10*3/uL (ref 4.0–10.5)

## 2014-03-20 NOTE — Progress Notes (Signed)
Patient ID: [name], male   DOB: [birth year], 74 y.o.   MRN: [id number] Patient presents for 2 week recheck CBC and PT/INR per Vicie Mutters, PA-C orders.

## 2014-03-21 LAB — PROTIME-INR
INR: 1.6 — ABNORMAL HIGH (ref <1.50)
Prothrombin Time: 19.1 seconds — ABNORMAL HIGH (ref 11.6–15.2)

## 2014-03-25 ENCOUNTER — Other Ambulatory Visit: Payer: Self-pay | Admitting: Physician Assistant

## 2014-03-29 ENCOUNTER — Ambulatory Visit (INDEPENDENT_AMBULATORY_CARE_PROVIDER_SITE_OTHER): Payer: Medicare Other | Admitting: Cardiovascular Disease

## 2014-03-29 ENCOUNTER — Encounter: Payer: Self-pay | Admitting: Cardiovascular Disease

## 2014-03-29 ENCOUNTER — Encounter: Payer: Self-pay | Admitting: *Deleted

## 2014-03-29 ENCOUNTER — Ambulatory Visit: Payer: Self-pay | Admitting: Cardiovascular Disease

## 2014-03-29 VITALS — BP 130/78 | HR 86 | Ht 71.0 in | Wt 200.5 lb

## 2014-03-29 DIAGNOSIS — I484 Atypical atrial flutter: Secondary | ICD-10-CM | POA: Diagnosis not present

## 2014-03-29 DIAGNOSIS — I509 Heart failure, unspecified: Secondary | ICD-10-CM | POA: Diagnosis not present

## 2014-03-29 DIAGNOSIS — R002 Palpitations: Secondary | ICD-10-CM | POA: Diagnosis not present

## 2014-03-29 DIAGNOSIS — I10 Essential (primary) hypertension: Secondary | ICD-10-CM

## 2014-03-29 DIAGNOSIS — Z951 Presence of aortocoronary bypass graft: Secondary | ICD-10-CM | POA: Diagnosis not present

## 2014-03-29 DIAGNOSIS — R0602 Shortness of breath: Secondary | ICD-10-CM | POA: Diagnosis not present

## 2014-03-29 DIAGNOSIS — I480 Paroxysmal atrial fibrillation: Secondary | ICD-10-CM

## 2014-03-29 DIAGNOSIS — I2510 Atherosclerotic heart disease of native coronary artery without angina pectoris: Secondary | ICD-10-CM

## 2014-03-29 DIAGNOSIS — I5032 Chronic diastolic (congestive) heart failure: Secondary | ICD-10-CM | POA: Diagnosis not present

## 2014-03-29 DIAGNOSIS — J90 Pleural effusion, not elsewhere classified: Secondary | ICD-10-CM | POA: Diagnosis not present

## 2014-03-29 MED ORDER — CARVEDILOL 6.25 MG PO TABS: 6.2500 mg | ORAL_TABLET | Freq: Two times a day (BID) | ORAL | Status: DC

## 2014-03-29 MED ORDER — CLONIDINE HCL 0.1 MG PO TABS: 0.0500 mg | ORAL_TABLET | Freq: Two times a day (BID) | ORAL | Status: DC

## 2014-03-29 NOTE — Progress Notes (Addendum)
Cardiology Office Note    Date:  03/29/2014   ID:  [name], DOB 1940-10-19, MRN [id number]  PCP:  Alesia Richards, MD  Cardiologist:   Thayer Headings, MD   Chief Complaint  Patient presents with  . other    C/o heart palpitations and sob. Meds reviewed verbally with pt.   Problem List 1. Hypertension 2. HOCM 3. Aortic insuffuciency 4. Atrial fibrillation-failed Tikosyn, Has seen [facility] in [place], [place] .  5. Diabetes mellitus 6. 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 He has a hyperdynamic LV with systolic anterior motion of the mitral valve leaflet. 8. CABG and MAZE 04-05-13  History of Present Illness:  [name] is a 74 year old gentleman with a history of hypertension, atrial fibrillation and a myocardial bridge. He is a previous patient of Dr. Susa Simmonds. He also has a history of diabetes. He has felt fairly well. He has not had any episodes of chest pain or shortness of breath.   Nov. 25, 2013: He is very symptomatic with his Atrial fib. He gets very winded with walking. He has been on Eliquis and tikosyn 500 BID. 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 He was diagnosed with panic attack. Enzymes were normal. He is ready for his cardioversion.  Nov. 27, 2013: Mr. Lasek was seen 2 days ago and was found to have atrial fibrillation. He was scheduled for cardioversion. When he showed up for cardioversion yesterday, he was in normal sinus rhythm.  He related to me that he had some medication changes over the past several weeks. He was previously on Tikosyn and citalopram and his atrial fibrillation seemed to be well-controlled on that. He's retired Software engineer and noted that the citalopram and Tikosyn interacted so his medical doctor changed him to Wellbutrin and stopped the citalopram. It was at that time that he started having increased palpitations, anxiety, and we noted him to be in atrial fibrillation. He then  discontinued the Wellbutrin and was started on Zoloft. He seems to be tolerating this combination. It was at that time that he showed up in the short stay Center and was found to have already converted to sinus rhythm.  May 31, 2012:  [name] is doing well. Still in NSR. No leg edema. He sticks to a low sodium diet. He has occasional CP - typically associated with arm / chest movement.   He wants to stop coumadin - interferes with his arthritis medications. He has maintained NSR since his last visit.   Nov. 24, 2014:  [name] is doing OK. He has some brief palps - he thinks they are PVCs. No sustained episodes of Afib. He has been getting Avastin injections into his eyes.   Jan. 12, 2015:  [name] is seen today as a work in visit for worsening ankle edema. He has not been able to associate his symptoms with change of diet. . he has also been noticing some increasing shortness of breath. He notes short or shortness of breath with any activity-example take the garbage cans out. He denies any PND. He uses a CPAP mask.   Jan. 26, 2015:  [name] is seen back today . He had CABG and MAZE on April 05, 2013 - slow recovery following surgery Had a left pleural tap ~ 4 weeks after surgery - 1.2 liters taken off - did much better after that  Had elective cardioversioin in early August, 2015.   [name] was seen 2 weeks ago with  symptoms consistent with congestive heart failure. He had been in persistent atrial fibrillation and so his tikosyn was stopped. He is still in atrial fibrillation. His rate is well controlled.   He had an echocardiogram: Left ventricle: Wall thickness was increased in a pattern of moderate LVH. Systolic function was vigorous. The estimated ejection fraction was in the range of 75% to 80%. Wall motion was normal; there were no regional wall motion abnormalities. - Aortic valve: Trivial regurgitation. - Left atrium: The atrium was mildly to moderately  dilated. - Right ventricle: The cavity size was mildly dilated. Systolic function was mildly reduced. - Right atrium: The atrium was moderately dilated. - Pulmonary arteries: Systolic pressure was mildly increased. PA peak pressure: 46mm Hg  He was found to have mild pulmonary hypertension.  He continues to have some dyspnea. He has ankle edema. His BP log shows mildly elevated BP.  May 26, 2013: [name] has had 2 V CABG and MAZE (April 05, 2013) since I last saw him. . He has done well He is having some tenderness along the right thigh SVG harvest site. Also c/o left 4th and 5th finger tingling / numbness.   He has otherwise done well. No cardiac complaints.   August 30, 2013:  He was cardioverted several weeks ago. After the cardioversion, his metoprolol was doubled to 25 BID, and diltizem was added   Oct. 2, 2015:  He has gone back into atrial fib. He has known left pleural effusion. He has had more dyspnea. Saw Dr. Melvyn Novas yesterday.  He is more lethargic. Has hypoxemia ( O2 sat of 88% with walking 50 feet)   Dec. 14, 2015:  [name] is followed for atrial fib. He has had a MAZE procedure which was unsuccessful. He had an A-fib ablation which has helped.  No A-fib. Swelling has resolved. Dyspnea has resolved. Doing well from a cardiac standpoint  Needs to have right thumb surgery. Also has bulging discs in his neck with pain radiation down his right arm/ shoulder   Dec. 30, 2015:  [name]'s BP has been elevated. He has had bronchitis and prednisone ( 2 rounds ) for the past several weeks.  Still has not had his thumb surgery.   March 12, 2014:   [name] is a 74 y.o. male who presents for follow-up of his coronary artery disease and atrial fibrillation.  He has had some DOE.  Was diagnosied with bronchitis several months ago.   thinks he had maintained NSR  No CP , sternotomy is healing up Has had his left thumb surgery    March 29, 2014: He is  still very short of breath with any exertion. He has an oxymeter - O2 dropped to 88% after walking back from the trash cans. Was 94% at rest. Has seen pulmonary, no evidence of COPD.   Has had a URI .  Has albuterol which does not seem to help.    Past Medical History  Diagnosis Date  . Hx of adenomatous colonic polyps   . Diabetes mellitus type II   . Hypertension   . Diverticulosis 2001  . Hyperlipidemia   . Persistent atrial fibrillation     a. s/p MAZE 04/2013 in setting of CABG. b. Amio stopped in 10/2013 after flutter ablation.  . Obstructive sleep apnea     compliant with CPAP  . Chronic diastolic congestive heart failure   . Hypertensive cardiomyopathy   . History of cardioversion     x3 (years uncertain)  .  H/O hiatal hernia   . Basal cell carcinoma   . Adrenal adenoma   . CAD (coronary artery disease)     a. 04/2013 CABG x 2: LIMA to LAD, SVG to RI, EVH via R thigh.  . S/P Maze operation for atrial fibrillation     a. 04/2013: Complete bilateral atrial lesion set using cryothermy and bipolar radiofrequency ablation with clipping of LA appendage (@ time of CABG)  . GERD (gastroesophageal reflux disease)   . Depression   . Anxiety   . DJD (degenerative joint disease)   . 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 DCCV 08/2013. b. s/p RFA 10/2013.  [name] Respiratory failure     a. Hypoxia 10/2013 - required supp O2 as inpatient, did not require it at discharge.  . Pleural effusion, left     a. s/p thoracentesis 05/2013.  [name] PFO (patent foramen ovale)     a. Small PFO by TEE 10/2013.    Past Surgical History  Procedure Laterality Date  . Polinydal cyst      Removed  . Great toe arthrodesis, interphalangeal joint      Right foot  . Retina repair-right    . Cataract extraction      bilateral  . Basal cell carcinoma excision      x3 on face  . Polypectomy    . Cardiac catheterization      myocardial bridge but no cad  . Coronary artery bypass graft N/A 04/05/2013     Procedure: CORONARY ARTERY BYPASS GRAFTING (CABG) TIMES TWO USING LEFT INTERNAL MAMMARY ARTERY AND RIGHT SAPHENOUS LEG VEIN HARVESTED ENDOSCOPICALLY;  Surgeon: Rexene Alberts, MD;  Location: [facility];  Service: Open Heart Surgery;  Laterality: N/A;  . Maze N/A 04/05/2013    Procedure: MAZE;  Surgeon: Rexene Alberts, MD;  Location: [facility];  Service: Open Heart Surgery;  Laterality: N/A;  . Intraoperative transesophageal echocardiogram N/A 04/05/2013    Procedure: INTRAOPERATIVE TRANSESOPHAGEAL ECHOCARDIOGRAM;  Surgeon: Rexene Alberts, MD;  Location: [facility];  Service: Open Heart Surgery;  Laterality: N/A;  . Tee without cardioversion N/A 08/23/2013    Procedure: TRANSESOPHAGEAL ECHOCARDIOGRAM (TEE);  Surgeon: Sanda Klein, MD;  Location: [facility];  Service: Cardiovascular;  Laterality: N/A;  . Cardioversion N/A 08/23/2013    Procedure: CARDIOVERSION;  Surgeon: Sanda Klein, MD;  Location: [facility] ENDOSCOPY;  Service: Cardiovascular;  Laterality: N/A;  . Tee without cardioversion N/A 10/26/2013    Procedure: TRANSESOPHAGEAL ECHOCARDIOGRAM (TEE);  Surgeon: Sueanne Margarita, MD;  Location: [facility] ENDOSCOPY;  Service: Cardiovascular;  Laterality: N/A;  . Left heart catheterization with coronary angiogram N/A 03/07/2013    Procedure: LEFT HEART CATHETERIZATION WITH CORONARY ANGIOGRAM;  Surgeon: Burnell Blanks, MD;  Location: [facility] CATH LAB;  Service: Cardiovascular;  Laterality: N/A;  . Atrial fibrillation ablation N/A 10/26/2013    Procedure: ATRIAL FIBRILLATION ABLATION;  Surgeon: Coralyn Mark, MD;  Location: [facility] CATH LAB;  Service: Cardiovascular;  Laterality: N/A;  . Carpometacarpel suspension plasty Left 02/14/2014    Procedure: CARPOMETACARPEL ([place]) SUSPENSIONPLASTY THUMB  WITH  ABDUCTOR POLLICIS LONGUS TRANSFER AND STENOSING TENOSYNOVITIS RELEASE LEFT WRIST;  Surgeon: Charlotte Crumb, MD;  Location: [facility];  Service: Orthopedics;  Laterality: Left;  . Trapezium resection        Current Outpatient Prescriptions  Medication Sig Dispense Refill  . acetaminophen (TYLENOL) 500 MG tablet Take 1,000 mg by mouth every 6 (six) hours as needed for mild pain.     [name]  ALPRAZolam (XANAX) 1 MG tablet Take 1 mg by mouth at bedtime as needed for anxiety or sleep.    [name] atorvastatin (LIPITOR) 40 MG tablet Take 40 mg by mouth daily.    . B Complex Vitamins (VITAMIN B COMPLEX PO) Take 1 tablet by mouth daily.    [name] BESIVANCE 0.6 % SUSP Place 1 drop into the left eye 4 (four) times daily as needed (after injection).     . carvedilol (COREG) 3.125 MG tablet Take 1 tablet (3.125 mg total) by mouth 2 (two) times daily. 180 tablet 3  . Cholecalciferol (VITAMIN D PO) Take 5,000 Units by mouth daily.    . cloNIDine (CATAPRES) 0.1 MG tablet Take 1 tablet (0.1 mg total) by mouth 2 (two) times daily. 60 tablet 11  . diphenhydrAMINE (BENADRYL) 25 MG tablet Take 25 mg by mouth every 6 (six) hours as needed for allergies.     . furosemide (LASIX) 40 MG tablet Take 2 tablets (80 mg total) by mouth daily. 60 tablet 6  . glimepiride (AMARYL) 2 MG tablet Take 2 mg by mouth daily with breakfast. If am fasting blood glucose is less than 120 hold amaryl dose.  For fasting blood glucose over 120 take 1/2 amaryl (1mg ).    [name] HYDROcodone-acetaminophen (NORCO/VICODIN) 5-325 MG per tablet Take 1 tablet by mouth every 6 (six) hours as needed for moderate pain. 30 tablet   . metFORMIN (GLUCOPHAGE-XR) 500 MG 24 hr tablet Take 1,000 mg by mouth 2 (two) times daily before a meal.     . minoxidil (LONITEN) 10 MG tablet Take 20 mg by mouth at bedtime.     . Multiple Vitamin (MULTIVITAMIN) tablet Take 1 tablet by mouth daily.    [name] oxyCODONE-acetaminophen (ROXICET) 5-325 MG per tablet Take 1 tablet by mouth every 4 (four) hours as needed for severe pain. 30 tablet 0  . potassium chloride SA (K-DUR,KLOR-CON) 20 MEQ tablet Take 2 tablets (40 mEq total) by mouth daily. 30 tablet 6  . ranitidine (ZANTAC) 150 MG tablet Take  150 mg by mouth daily as needed for heartburn.     . sertraline (ZOLOFT) 100 MG tablet TAKE 1 TABLET BY MOUTH DAILY 30 tablet 11  . valsartan (DIOVAN) 320 MG tablet Take 1 tablet (320 mg total) by mouth daily. 90 tablet 3  . warfarin (COUMADIN) 5 MG tablet Take 7.5 mg by mouth daily. On Sunday 10 mg  Rest of the rest of week 7.5 mg     No current facility-administered medications for this visit.    Allergies:   Sunflower seed; Horse-derived products; and Other    Social History:  The patient  reports that he quit smoking about 33 years ago. His smoking use included Cigarettes. He has a 100 pack-year smoking history. He has never used smokeless tobacco. He reports that he drinks alcohol. He reports that he does not use illicit drugs.   Family History:  The patient's family history includes Atrial fibrillation in his mother; Colon cancer in his mother; Colon polyps in his mother and sister; Dementia in his father; Diabetes in his maternal uncle; Hypertension in his mother; Stroke in his paternal uncle.    ROS:  Please see the history of present illness.    Review of Systems: Constitutional:  denies fever, chills, diaphoresis, appetite change and fatigue.  HEENT: denies photophobia, eye pain, redness, hearing loss, ear pain, congestion, sore throat, rhinorrhea, sneezing, neck pain, neck stiffness and tinnitus.  Respiratory: admits to   DOE,  Extreme shortness of breath with any walking   Cardiovascular: denies chest pain, palpitations and leg swelling.  Gastrointestinal: denies nausea, vomiting, abdominal pain, diarrhea, constipation, blood in stool.  Genitourinary: denies dysuria, urgency, frequency, hematuria, flank pain and difficulty urinating.  Musculoskeletal: denies  myalgias, back pain, joint swelling, arthralgias and gait problem.   Skin: denies pallor, rash and wound.  Neurological: denies dizziness, seizures, syncope, weakness, light-headedness, numbness and headaches.    Hematological: denies adenopathy, easy bruising, personal or family bleeding history.  Psychiatric/ Behavioral: denies suicidal ideation, mood changes, confusion, nervousness, sleep disturbance and agitation.       All other systems are reviewed and negative.    PHYSICAL EXAM: VS:  BP 130/78 mmHg  Pulse 86  Ht 5\' 11"  (1.803 m)  Wt 200 lb 8 oz (90.946 kg)  BMI 27.98 kg/m2 , BMI Body mass index is 27.98 kg/(m^2). GEN: Well nourished, well developed, in no acute distress HEENT: normal Neck: no JVD, carotid bruits, or masses Cardiac: RRR; 2/6 systolic  Murmur,   Hyperdynamic PMI  rubs, or gallops,no edema  Respiratory:  Decreased breath sounds in left base, right side sounds ok GI: soft, nontender, nondistended, + BS MS: no deformity or atrophy Skin: warm and dry, no rash Neuro:  Strength and sensation are intact Psych: normal   EKG:  EKG is ordered today. ECG reveals probable NSR with 1st degree AV block , HR of 86   Recent Labs: 11/06/2013: Pro B Natriuretic peptide (BNP) 235.0* 01/30/2014: ALT 14; Magnesium 1.8; TSH 1.629 03/05/2014: BUN 17; Creatinine 0.96; Potassium 3.9; Sodium 139 03/20/2014: Hemoglobin 11.7*; Platelets 214    Lipid Panel    Component Value Date/Time   CHOL 161 01/30/2014 1214   TRIG 154* 01/30/2014 1214   HDL 42 01/30/2014 1214   CHOLHDL 3.8 01/30/2014 1214   VLDL 31 01/30/2014 1214   LDLCALC 88 01/30/2014 1214      Wt Readings from Last 3 Encounters:  03/29/14 200 lb 8 oz (90.946 kg)  03/12/14 201 lb 12.8 oz (91.536 kg)  03/05/14 196 lb (88.905 kg)      Other studies Reviewed: Additional studies/ records that were reviewed today include: . Review of the above records demonstrates:    ASSESSMENT AND PLAN:  1. Hypertension- blood pressures fairly well-controlled. He's slightly hyperdynamic.  Will increase his Coreg to 6. 25 bid and cut his clonidine to 0.05 mg BID.   2. HOCM - he has a systolic murmur consistent with hypertrophic  obstructive cardio mildly.   3. Aortic insuffuciency 4. Atrial fibrillation-failed Tikosyn, Has seen [facility] in [place], [place] .  So far he seems to be maintaining normal sinus rhythm. 5. Diabetes mellitus 6. 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 He has a hyperdynamic LV with systolic anterior motion of the mitral valve leaflet. 8. CABG and MAZE 04-05-13 - no recent angina. He is healing quite nicely. 9. Dyspnea:  He still has significant dyspnea that seems to be out of proportion to his cardiac issues.  Possibilities include persistent diastolic dysfunction from his severe LVH.  Increasing the coreg should help with that. - lack of effective atrial kick , even in sinus rhythm, his PR interval is prolonged which may not be providing an effective atrial kick.  - persistent pleural effusion -   Will see him in 3 months .  Current medicines are reviewed at length with the patient today.  The patient does not have concerns regarding medicines.  The following changes have been made:  See above.   Disposition:   FU with me in 2-3 weeks      Signed, Nahser, Wonda Cheng, MD  03/29/2014 9:10 AM    [address], [place], [place]  [address] Phone: [phone]; Fax: [phone]   Addendum:  Pt had a cardiopulmonary stress test.  Patient's O2 sat 88% on room air  at rest   He will need home O2.   Suggest starting 2 LPM and may titrate as needed to keep O2  sats >  90%.      Nahser, Wonda Cheng, MD  05/08/2014 9:50 AM    [place],  [place], [place]  [address] Pager [phone] Phone: [phone]; Fax: [phone]   [facility]  [address] [place],   [address] [phone]    Fax [phone]

## 2014-03-29 NOTE — Patient Instructions (Addendum)
Your physician has recommended that you have a cardiopulmonary stress test (CPX). CPX testing is a non-invasive measurement of heart and lung function. It replaces a traditional treadmill stress test. This type of test provides a tremendous amount of information that relates not only to your present condition but also for future outcomes. This test combines measurements of you ventilation, respiratory gas exchange in the lungs, electrocardiogram (EKG), blood pressure and physical response before, during, and following an exercise protocol. 04/18/14 at 0800 at [facility] the night before and the morning of procedure  No food tobacco and alcohol 3 before test  No heavy exertion 24 hrs before  Take your other medications  Wear comfortable cloths and walking shoes   A chest x-ray takes a picture of the organs and structures inside the chest, including the heart, lungs, and blood vessels. This test can show several things, including, whether the heart is enlarges; whether fluid is building up in the lungs; and whether pacemaker / defibrillator leads are still in place.   Your physician has requested that you have an echocardiogram. Echocardiography is a painless test that uses sound waves to create images of your heart. It provides your doctor with information about the size and shape of your heart and how well your heart's chambers and valves are working. This procedure takes approximately one hour. There are no restrictions for this procedure.  Your physician has recommended you make the following change in your medication:  Increase the Coreg to 6.25 mg twice daily  Decreasing Clonidine to 0.05 mg (1/2 tablet) twice daily   Your physician recommends that you schedule a follow-up appointment in:  3 months

## 2014-03-30 ENCOUNTER — Encounter: Payer: Self-pay | Admitting: Cardiovascular Disease

## 2014-03-30 ENCOUNTER — Telehealth: Payer: Self-pay | Admitting: *Deleted

## 2014-03-30 ENCOUNTER — Encounter: Payer: Self-pay | Admitting: Internal Medicine

## 2014-03-30 ENCOUNTER — Other Ambulatory Visit: Payer: Self-pay | Admitting: *Deleted

## 2014-03-30 DIAGNOSIS — I5032 Chronic diastolic (congestive) heart failure: Secondary | ICD-10-CM

## 2014-03-30 DIAGNOSIS — R0602 Shortness of breath: Secondary | ICD-10-CM

## 2014-03-30 NOTE — Telephone Encounter (Signed)
-----   Message from Thayer Headings, MD sent at 03/30/2014  1:40 PM EDT ----- CXR shows some pulmonary edema. Have him try taking the lasix and potassium TID for the next 2 days to see if that helps.  Report back to [place] on Monday to let us know how he is doing

## 2014-03-30 NOTE — Telephone Encounter (Signed)
Spoke with Dr. Acie Fredrickson regarding his order  He gave me a verbal correction:  Have patient take and extra 80 mg of lasix bid for 2-3 days and an extra potassium tablet at night  Have patient call on Monday to let us know if symptoms have improved   Patient verbalized understanding

## 2014-04-02 ENCOUNTER — Other Ambulatory Visit (INDEPENDENT_AMBULATORY_CARE_PROVIDER_SITE_OTHER): Payer: Medicare Other

## 2014-04-02 ENCOUNTER — Other Ambulatory Visit (HOSPITAL_COMMUNITY): Payer: Self-pay | Admitting: Cardiovascular Disease

## 2014-04-02 ENCOUNTER — Telehealth: Payer: Self-pay | Admitting: Cardiovascular Disease

## 2014-04-02 ENCOUNTER — Ambulatory Visit (HOSPITAL_COMMUNITY): Payer: Medicare Other | Attending: Internal Medicine | Admitting: Radiology

## 2014-04-02 DIAGNOSIS — R0602 Shortness of breath: Secondary | ICD-10-CM

## 2014-04-02 DIAGNOSIS — I5030 Unspecified diastolic (congestive) heart failure: Secondary | ICD-10-CM

## 2014-04-02 DIAGNOSIS — M6281 Muscle weakness (generalized): Secondary | ICD-10-CM | POA: Diagnosis not present

## 2014-04-02 DIAGNOSIS — M79642 Pain in left hand: Secondary | ICD-10-CM | POA: Diagnosis not present

## 2014-04-02 DIAGNOSIS — M25642 Stiffness of left hand, not elsewhere classified: Secondary | ICD-10-CM | POA: Diagnosis not present

## 2014-04-02 DIAGNOSIS — Z1212 Encounter for screening for malignant neoplasm of rectum: Secondary | ICD-10-CM

## 2014-04-02 DIAGNOSIS — M1812 Unilateral primary osteoarthritis of first carpometacarpal joint, left hand: Secondary | ICD-10-CM | POA: Diagnosis not present

## 2014-04-02 LAB — POC HEMOCCULT BLD/STL (HOME/3-CARD/SCREEN)
Card #2 Fecal Occult Blod, POC: NEGATIVE
Card #3 Fecal Occult Blood, POC: NEGATIVE
FECAL OCCULT BLD: NEGATIVE

## 2014-04-02 NOTE — Progress Notes (Signed)
Echocardiogram performed.  

## 2014-04-02 NOTE — Telephone Encounter (Signed)
New message      Pt c/o medication issue:  1. Name of Medication: lasix 2. How are you currently taking this medication (dosage and times per day)?80mg  bid 3. Are you having a reaction (difficulty breathing--STAT)? no  4. What is your medication issue? Calling to give update on medication change--- Still have sob on exertion---loss approx 4 lbs

## 2014-04-02 NOTE — Telephone Encounter (Signed)
Spoke with patient who called to report how he is feeling since increasing the Lasix to 80 mg TID on Friday; states he has lost 4 lbs even though he has eaten a lot over the weekend due to celebrations he attended.  Patient states he does not note a significant difference in the SOB; states he continues to be SOB with activity.  Patient had echocardiogram today.  I advised patient to continue current medications and that Dr. Acie Fredrickson can review the echo tomorrow morning.  I advised him I will call him with Dr. Elmarie Shiley advice once he has reviewed the echo.  Patient verbalized understanding and agreement.

## 2014-04-03 ENCOUNTER — Other Ambulatory Visit (HOSPITAL_COMMUNITY): Payer: Self-pay | Admitting: Radiology

## 2014-04-03 ENCOUNTER — Ambulatory Visit (INDEPENDENT_AMBULATORY_CARE_PROVIDER_SITE_OTHER)
Admission: RE | Admit: 2014-04-03 | Discharge: 2014-04-03 | Disposition: A | Discharge status: Home / Self Care | Payer: Medicare Other | Source: Ambulatory Visit | Attending: Cardiovascular Disease | Admitting: Cardiovascular Disease

## 2014-04-03 ENCOUNTER — Other Ambulatory Visit (INDEPENDENT_AMBULATORY_CARE_PROVIDER_SITE_OTHER): Payer: Medicare Other

## 2014-04-03 ENCOUNTER — Telehealth: Payer: Self-pay | Admitting: Nurse Practitioner

## 2014-04-03 DIAGNOSIS — R0602 Shortness of breath: Secondary | ICD-10-CM

## 2014-04-03 LAB — BASIC METABOLIC PANEL
BUN: 19 mg/dL (ref 6–23)
CHLORIDE: 102 meq/L (ref 96–112)
CO2: 32 meq/L (ref 19–32)
Calcium: 9.6 mg/dL (ref 8.4–10.5)
Creatinine, Ser: 1.06 mg/dL (ref 0.40–1.50)
GFR: 72.39 mL/min (ref >60.00)
Glucose, Bld: 218 mg/dL — ABNORMAL HIGH (ref 70–99)
Potassium: 4.1 mEq/L (ref 3.5–5.1)
Sodium: 140 mEq/L (ref 135–145)

## 2014-04-03 LAB — D-DIMER, QUANTITATIVE: D-Dimer, Quant: 1.17 ug/mL-FEU — ABNORMAL HIGH (ref 0.00–0.48)

## 2014-04-03 MED ORDER — IOHEXOL 350 MG/ML SOLN
75.0000 mL | Freq: Once | INTRAVENOUS | Status: AC | PRN
  Administered 2014-04-03: 75 mL via INTRAVENOUS

## 2014-04-03 NOTE — Telephone Encounter (Signed)
Agree with note from Michelle Swinyer, RN  

## 2014-04-03 NOTE — Telephone Encounter (Signed)
Spoke with patient and reviewed Dr. Elmarie Shiley advice with patient to get bmet and d-dimer lab work and CT Angio to r/o PE.  Patient verbalized understanding and agreement with plan.  I advised patient to go to [facility] on [address] for stat lab work as they will be processed more quickly there.  Patient is scheduled for CT at 3:30 today here at [facility].

## 2014-04-03 NOTE — Telephone Encounter (Signed)
Labs entered stat for [facility] lab to collect

## 2014-04-05 ENCOUNTER — Encounter: Payer: Self-pay | Admitting: Internal Medicine

## 2014-04-05 ENCOUNTER — Ambulatory Visit (INDEPENDENT_AMBULATORY_CARE_PROVIDER_SITE_OTHER): Payer: Medicare Other | Admitting: Internal Medicine

## 2014-04-05 VITALS — BP 122/60 | HR 76 | Temp 97.5°F | Resp 16 | Ht 71.0 in | Wt 196.8 lb

## 2014-04-05 DIAGNOSIS — Z7901 Long term (current) use of anticoagulants: Secondary | ICD-10-CM

## 2014-04-05 DIAGNOSIS — I10 Essential (primary) hypertension: Secondary | ICD-10-CM | POA: Diagnosis not present

## 2014-04-05 DIAGNOSIS — Z79899 Other long term (current) drug therapy: Secondary | ICD-10-CM | POA: Diagnosis not present

## 2014-04-05 DIAGNOSIS — I480 Paroxysmal atrial fibrillation: Secondary | ICD-10-CM | POA: Diagnosis not present

## 2014-04-05 LAB — BASIC METABOLIC PANEL WITH GFR
BUN: 18 mg/dL (ref 6–23)
CO2: 29 meq/L (ref 19–32)
CREATININE: 1.05 mg/dL (ref 0.50–1.35)
Calcium: 9.2 mg/dL (ref 8.4–10.5)
Chloride: 103 mEq/L (ref 96–112)
GFR, Est African American: 80 mL/min
GFR, Est Non African American: 69 mL/min
Glucose, Bld: 212 mg/dL — ABNORMAL HIGH (ref 70–99)
Potassium: 4.1 mEq/L (ref 3.5–5.3)
Sodium: 141 mEq/L (ref 135–145)

## 2014-04-05 NOTE — Progress Notes (Signed)
Subjective:    Patient ID: [name], male    DOB: 26-Sep-1940, 74 y.o.   MRN: [id number]  HPI This very nice 74 yo MWM with Multiple co-morbidities including, HTN, ASCAD, pSVT's, s/p CABG & Maze procedure (10/2013) presents for monthly coag monitoring. Other problems include T2_NIDDM / CKD3 and vit D Deficiency. He just had a negative CT angio scan yesterday and is withholding his Metformin pending f/u post dye procedure. He denies any cardiac sx's or problems with bruising or bleeding  Medication Sig  . acetaminophen (TYLENOL) 500 MG tablet Take 1,000 mg by mouth every 6 (six) hours as needed for mild pain.   [name] ALPRAZolam (XANAX) 1 MG tablet Take 1 mg by mouth at bedtime as needed for anxiety or sleep.  [name] atorvastatin (LIPITOR) 40 MG tablet Take 40 mg by mouth daily.  . B Complex Vitamins (VITAMIN B COMPLEX PO) Take 1 tablet by mouth daily.  [name] BESIVANCE 0.6 % SUSP Place 1 drop into the left eye 4 (four) times daily as needed (after injection).   . Cholecalciferol (VITAMIN D PO) Take 5,000 Units by mouth daily.  . cloNIDine (CATAPRES) 0.1 MG tablet Take 0.5 tablets (0.05 mg total) by mouth 2 (two) times daily.  . diphenhydrAMINE (BENADRYL) 25 MG tablet Take 25 mg by mouth every 6 (six) hours as needed for allergies.   . furosemide (LASIX) 40 MG tablet Take 2 tablets (80 mg total) by mouth daily.  [name] glimepiride (AMARYL) 2 MG tablet Take 2 mg by mouth daily with breakfast. If am fasting blood glucose is less than 120 hold amaryl dose.  For fasting blood glucose over 120 take 1/2 amaryl (1mg ).  . HYDROcodone-acetaminophen (NORCO/VICODIN) 5-325 MG per tablet Take 1 tablet by mouth every 6 (six) hours as needed for moderate pain.  . metFORMIN (GLUCOPHAGE-XR) 500 MG 24 hr tablet Take 1,000 mg by mouth 2 (two) times daily before a meal.   . minoxidil (LONITEN) 10 MG tablet Take 20 mg by mouth at bedtime.   . Multiple Vitamin (MULTIVITAMIN) tablet Take 1 tablet by mouth daily.  . potassium chloride  SA (K-DUR,KLOR-CON) 20 MEQ tablet Take 2 tablets (40 mEq total) by mouth daily.  . ranitidine (ZANTAC) 150 MG tablet Take 150 mg by mouth daily as needed for heartburn.   . sertraline (ZOLOFT) 100 MG tablet TAKE 1 TABLET BY MOUTH DAILY  . valsartan (DIOVAN) 320 MG tablet Take 1 tablet (320 mg total) by mouth daily.  [name] warfarin (COUMADIN) 5 MG tablet Take 7.5 mg by mouth daily. On Sunday 10 mg  Rest of the rest of week 7.5 mg  . carvedilol (COREG) 6.25 MG tablet Take 1 tablet (6.25 mg total) by mouth 2 (two) times daily.  [name] oxyCODONE-acetaminophen (ROXICET) 5-325 MG per tablet Take 1 tablet by mouth every 4 (four) hours as needed for severe pain.   Allergies  Allergen Reactions  . Sunflower Seed [Sunflower Oil] Swelling and Other (See Comments)    Tongue and lip swelling  . Horse-Derived Products Other (See Comments)    Based on skin test reaction  . Other Other (See Comments)    Tetanus Shot -- skin test reaction   Past Medical History  Diagnosis Date  . Hx of adenomatous colonic polyps   . Diabetes mellitus type II   . Hypertension   . Diverticulosis 2001  . Hyperlipidemia   . Persistent atrial fibrillation     a. s/p MAZE 04/2013 in setting of CABG. b. Amio  stopped in 10/2013 after flutter ablation.  . Obstructive sleep apnea     compliant with CPAP  . Chronic diastolic congestive heart failure   . Hypertensive cardiomyopathy   . History of cardioversion     x3 (years uncertain)  . H/O hiatal hernia   . Basal cell carcinoma   . Adrenal adenoma   . CAD (coronary artery disease)     a. 04/2013 CABG x 2: LIMA to LAD, SVG to RI, EVH via R thigh.  . S/P Maze operation for atrial fibrillation     a. 04/2013: Complete bilateral atrial lesion set using cryothermy and bipolar radiofrequency ablation with clipping of LA appendage (@ time of CABG)  . GERD (gastroesophageal reflux disease)   . Depression   . Anxiety   . DJD (degenerative joint disease)   . Atypical atrial flutter 8/15,  10/15    a. DCCV 08/2013. b. s/p RFA 10/2013.  [name] Respiratory failure     a. Hypoxia 10/2013 - required supp O2 as inpatient, did not require it at discharge.  . Pleural effusion, left     a. s/p thoracentesis 05/2013.  [name] PFO (patent foramen ovale)     a. Small PFO by TEE 10/2013.   Past Surgical History  Procedure Laterality Date  . Polinydal cyst      Removed  . Great toe arthrodesis, interphalangeal joint      Right foot  . Retina repair-right    . Cataract extraction      bilateral  . Basal cell carcinoma excision      x3 on face  . Polypectomy    . Cardiac catheterization      myocardial bridge but no cad  . Coronary artery bypass graft N/A 04/05/2013    Procedure: CORONARY ARTERY BYPASS GRAFTING (CABG) TIMES TWO USING LEFT INTERNAL MAMMARY ARTERY AND RIGHT SAPHENOUS LEG VEIN HARVESTED ENDOSCOPICALLY;  Surgeon: Rexene Alberts, MD;  Location: [place];  Service: Open Heart Surgery;  Laterality: N/A;  . Maze N/A 04/05/2013    Procedure: MAZE;  Surgeon: Rexene Alberts, MD;  Location: [place];  Service: Open Heart Surgery;  Laterality: N/A;  . Intraoperative transesophageal echocardiogram N/A 04/05/2013    Procedure: INTRAOPERATIVE TRANSESOPHAGEAL ECHOCARDIOGRAM;  Surgeon: Rexene Alberts, MD;  Location: [place];  Service: Open Heart Surgery;  Laterality: N/A;  . Tee without cardioversion N/A 08/23/2013    Procedure: TRANSESOPHAGEAL ECHOCARDIOGRAM (TEE);  Surgeon: Sanda Klein, MD;  Location: [place];  Service: Cardiovascular;  Laterality: N/A;  . Cardioversion N/A 08/23/2013    Procedure: CARDIOVERSION;  Surgeon: Sanda Klein, MD;  Location: [facility] ENDOSCOPY;  Service: Cardiovascular;  Laterality: N/A;  . Tee without cardioversion N/A 10/26/2013    Procedure: TRANSESOPHAGEAL ECHOCARDIOGRAM (TEE);  Surgeon: Sueanne Margarita, MD;  Location[facility] ENDOSCOPY;  Service: Cardiovascular;  Laterality: N/A;  . Left heart catheterization with coronary angiogram N/A 03/07/2013    Procedure: LEFT HEART  CATHETERIZATION WITH CORONARY ANGIOGRAM;  Surgeon: Burnell Blanks, MD;  Location: [facility] CATH LAB;  Service: Cardiovascular;  Laterality: N/A;  . Atrial fibrillation ablation N/A 10/26/2013    Procedure: ATRIAL FIBRILLATION ABLATION;  Surgeon: Coralyn Mark, MD;  Location: [place] CATH LAB;  Service: Cardiovascular;  Laterality: N/A;  . Carpometacarpel suspension plasty Left 02/14/2014    Procedure: CARPOMETACARPEL ([place]) SUSPENSIONPLASTY THUMB  WITH  ABDUCTOR POLLICIS LONGUS TRANSFER AND STENOSING TENOSYNOVITIS RELEASE LEFT WRIST;  Surgeon: Charlotte Crumb, MD;  Location: [place];  Service: Orthopedics;  Laterality: Left;  .  Trapezium resection     Review of Systems 10 point systems review negative except as above.    Objective:   Physical Exam BP 122/60 mmHg  Pulse 76  Temp(Src) 97.5 F (36.4 C)  Resp 16  Ht 5\' 11"  (1.803 m)  Wt 196 lb 12.8 oz (89.268 kg)  BMI 27.46 kg/m2  HEENT - Eac's patent. TM's Nl. EOM's full. PERRLA. NasoOroPharynx clear. Neck - supple. Nl Thyroid. Carotids 2+ & No bruits, nodes, JVD Chest - Clear equal BS w/o Rales, rhonchi, wheezes. Cor - Nl HS. RRR w/o sig MGR. PP 1(+). No edema. Abd - No palpable organomegaly, masses or tenderness. BS nl. MS- FROM w/o deformities. Muscle power, tone and bulk Nl. Gait Nl. Neuro - No obvious Cr N abnormalities. Sensory, motor and Cerebellar functions appear Nl w/o focal abnormalities. Psyche - Mental status normal & appropriate.  No delusions, ideations or obvious mood abnormalities.    Assessment & Plan:   1. Essential hypertension   2. PAF (paroxysmal atrial fibrillation)   3. Chronic anticoagulation  - Protime-INR  4. Medication management  - BASIC METABOLIC PANEL WITH GFR

## 2014-04-06 ENCOUNTER — Encounter: Payer: Self-pay | Admitting: Internal Medicine

## 2014-04-06 LAB — PROTIME-INR
INR: 1.72 — AB (ref <1.50)
Prothrombin Time: 20.2 seconds — ABNORMAL HIGH (ref 11.6–15.2)

## 2014-04-17 DIAGNOSIS — M1812 Unilateral primary osteoarthritis of first carpometacarpal joint, left hand: Secondary | ICD-10-CM | POA: Diagnosis not present

## 2014-04-18 ENCOUNTER — Ambulatory Visit (HOSPITAL_COMMUNITY): Payer: Medicare Other | Attending: Cardiovascular Disease

## 2014-04-18 DIAGNOSIS — R0600 Dyspnea, unspecified: Secondary | ICD-10-CM | POA: Diagnosis not present

## 2014-04-18 DIAGNOSIS — I5032 Chronic diastolic (congestive) heart failure: Secondary | ICD-10-CM | POA: Diagnosis not present

## 2014-04-18 DIAGNOSIS — R0602 Shortness of breath: Secondary | ICD-10-CM | POA: Diagnosis not present

## 2014-04-19 ENCOUNTER — Encounter: Payer: Self-pay | Admitting: Cardiovascular Disease

## 2014-04-23 ENCOUNTER — Ambulatory Visit: Payer: Medicare Other | Admitting: Thoracic Surgery (Cardiothoracic Vascular Surgery)

## 2014-04-24 ENCOUNTER — Encounter (INDEPENDENT_AMBULATORY_CARE_PROVIDER_SITE_OTHER): Payer: Medicare Other | Admitting: Ophthalmology

## 2014-04-24 ENCOUNTER — Telehealth: Payer: Self-pay | Admitting: Cardiovascular Disease

## 2014-04-24 DIAGNOSIS — H34832 Tributary (branch) retinal vein occlusion, left eye: Secondary | ICD-10-CM | POA: Diagnosis not present

## 2014-04-24 DIAGNOSIS — H338 Other retinal detachments: Secondary | ICD-10-CM

## 2014-04-24 DIAGNOSIS — H43813 Vitreous degeneration, bilateral: Secondary | ICD-10-CM | POA: Diagnosis not present

## 2014-04-24 DIAGNOSIS — I10 Essential (primary) hypertension: Secondary | ICD-10-CM

## 2014-04-24 DIAGNOSIS — H35033 Hypertensive retinopathy, bilateral: Secondary | ICD-10-CM

## 2014-04-24 NOTE — Telephone Encounter (Signed)
New Message   Pt c/o Shortness Of Breath: STAT if SOB developed within the last 24 hours or pt is noticeably SOB on the phone  1. Are you currently SOB (can you hear that pt is SOB on the phone)? Yes  2. How long have you been experiencing SOB? Couple of months  3. Are you SOB when sitting or when up moving around? Both   4. Are you currently experiencing any other symptoms? No

## 2014-04-24 NOTE — Telephone Encounter (Signed)
Spoke with patient who states he is feeling about 60% today.  I advised patient that I have reviewed his complaints and also directed question from previous email to Dr. Acie Fredrickson about holding Carvedilol to determine if it is cause of SOB; Dr. Acie Fredrickson is in agreement that patient can hold Carvedilol to see if there is improvement in symptoms.  I advised patient that we have not received final interpretation of CPX yet and that I will call him back with those results when they are available.  Patient verbalized understanding and agreement.

## 2014-04-25 NOTE — Telephone Encounter (Signed)
We are still waiting on the official reading. He may need a right and left heart cath. Will call him when the results are available

## 2014-04-26 NOTE — Telephone Encounter (Signed)
Called patient back. He is asking about the final results of the CPX test. Advised him that Dr.Nahser was still waiting for the final reading. He is requesting that Dr.Nahser call him tomorrow for plan of action. Patient states that he still has SOB. Advised will forward message to Dr.Nahser for follow up tomorrow.

## 2014-04-26 NOTE — Telephone Encounter (Signed)
Follow Up       Pt calling back to speak to [facility] about results. Please call back and advise.

## 2014-04-26 NOTE — Telephone Encounter (Signed)
I called and talked to Mr. [name] a message back to Sharyn Lull ( I wrote my note prior to seeing this note)

## 2014-04-30 ENCOUNTER — Ambulatory Visit (INDEPENDENT_AMBULATORY_CARE_PROVIDER_SITE_OTHER): Payer: Medicare Other | Admitting: Thoracic Surgery (Cardiothoracic Vascular Surgery)

## 2014-04-30 ENCOUNTER — Encounter: Payer: Self-pay | Admitting: Thoracic Surgery (Cardiothoracic Vascular Surgery)

## 2014-04-30 VITALS — BP 136/75 | HR 100 | Resp 20 | Ht 71.0 in | Wt 191.5 lb

## 2014-04-30 DIAGNOSIS — Z951 Presence of aortocoronary bypass graft: Secondary | ICD-10-CM

## 2014-04-30 DIAGNOSIS — I480 Paroxysmal atrial fibrillation: Secondary | ICD-10-CM

## 2014-04-30 DIAGNOSIS — I2510 Atherosclerotic heart disease of native coronary artery without angina pectoris: Secondary | ICD-10-CM | POA: Diagnosis not present

## 2014-04-30 NOTE — Patient Instructions (Signed)
Follow up with Dr [name] and Dr Acie Fredrickson as previously arranged

## 2014-04-30 NOTE — Progress Notes (Signed)
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  His early postoperative course in the hospital was uncomplicated although he developed rate-controlled atrial flutter. He later developed shortness of breath, orthopnea, and the left pleural effusion associated with acute exacerbation of chronic diastolic congestive heart failure. He has long-standing hypertension with severe diastolic dysfunction. He spontaneously converted to sinus rhythm prior to his appointment with Dr. Acie Fredrickson on 05/26/2013. Over the next 3 months the patient did quite well. He participated actively in the cardiac rehabilitation program and made good progress. He developed recurrent atypical atrial flutter associated with acute exacerbation of chronic diastolic congestive heart failure and was hospitalized briefly in August, during which time he underwent DC cardioversion. He initially felt better again after cardioversion, but within a few weeks he began to experience worsening symptoms of exertional shortness of breath. He was last seen here in our office on 10/16/2013. At that time he remained in rate controlled atrial flutter with ongoing symptoms of chronic diastolic congestive heart failure. He was referred back to Dr. Joylene Grapes who subsequently performed a comprehensive EP study with successful ablation of atypical left sided atrial flutter on 10/26/2013.   He was last seen here in our office on 11/20/2013 at which time he was doing well.  According to the patient he continued to do well until February of this year when he began to experience worsening exertional shortness of breath.  He was  seen in follow-up by Dr. Acie Fredrickson and a routine follow-up echocardiogram was performed 04/02/2014 which demonstrated vigorous left ventricular systolic function with ejection fraction estimated 65-70%, moderate left ventricular hypertrophy with severe focal basal hypertrophy of the septum, systolic anterior motion of the mitral valve, and signs of pulmonary hypertension with severe right ventricular chamber enlargement and moderate tricuspid regurgitation.  CT angiogram of the chest revealed no evidence for pulmonary embolus. The patient subsequently underwent a cardiopulmonary exercise test on 04/18/2014 that revealed Moderate to severely reduced functional capacity consistent with moderate obstructive and restrictive lung disease and significant component of heart failure.  The patient has been referred to [name] to consider possible right heart catheterization.  The patient states that symptoms seemed to acutely get worse around the end of February and early part of March of this year he now gets short of breath with mild activity. His activities are significantly limited, but he does not get short of breath at rest. He denies any history of chest pain or chest tightness other with activity or at rest. He has not had palpitations but he has noted that his resting heart rate is faster than it had been previously. He denies any PND, orthopnea, dizzy spells, or syncope. He has had mild lower extremity edema and he occasionally takes extra Lasix as needed.   Current Outpatient Prescriptions  Medication Sig Dispense Refill  . acetaminophen (TYLENOL) 500 MG tablet Take 1,000 mg by mouth every 6 (six) hours as needed for mild pain.     [name] ALPRAZolam (XANAX) 1 MG tablet Take 1 mg by mouth at bedtime as needed for anxiety or sleep.    [name]  atorvastatin (LIPITOR) 40 MG tablet Take 40 mg by mouth daily.    . B Complex Vitamins (VITAMIN B COMPLEX PO) Take 1 tablet by mouth daily.    [name] BESIVANCE 0.6 % SUSP Place 1  drop into the left eye 4 (four) times daily as needed (after injection).     . carvedilol (COREG) 6.25 MG tablet Take 6.25 mg by mouth 2 (two) times daily with a meal.    . Cholecalciferol (VITAMIN D PO) Take 5,000 Units by mouth daily.    . cloNIDine (CATAPRES) 0.1 MG tablet Take 0.5 tablets (0.05 mg total) by mouth 2 (two) times daily. 30 tablet 6  . diphenhydrAMINE (BENADRYL) 25 MG tablet Take 25 mg by mouth every 6 (six) hours as needed for allergies.     . furosemide (LASIX) 40 MG tablet Take 2 tablets (80 mg total) by mouth daily. 60 tablet 6  . glimepiride (AMARYL) 2 MG tablet Take 2 mg by mouth daily with breakfast. If am fasting blood glucose is less than 120 hold amaryl dose.  For fasting blood glucose over 120 take 1/2 amaryl (1mg ).    . HYDROcodone-acetaminophen (NORCO/VICODIN) 5-325 MG per tablet Take 1 tablet by mouth every 6 (six) hours as needed for moderate pain. 30 tablet   . metFORMIN (GLUCOPHAGE-XR) 500 MG 24 hr tablet Take 1,000 mg by mouth 2 (two) times daily before a meal.     . minoxidil (LONITEN) 10 MG tablet Take 20 mg by mouth at bedtime.     . Multiple Vitamin (MULTIVITAMIN) tablet Take 1 tablet by mouth daily.    . potassium chloride SA (K-DUR,KLOR-CON) 20 MEQ tablet Take 2 tablets (40 mEq total) by mouth daily. 30 tablet 6  . ranitidine (ZANTAC) 150 MG tablet Take 150 mg by mouth daily as needed for heartburn.     . sertraline (ZOLOFT) 100 MG tablet TAKE 1 TABLET BY MOUTH DAILY 30 tablet 11  . valsartan (DIOVAN) 320 MG tablet Take 1 tablet (320 mg total) by mouth daily. 90 tablet 3  . warfarin (COUMADIN) 5 MG tablet Take 7.5 mg by mouth daily. On Sunday 10 mg  Rest of the rest of week 7.5 mg..* No surgery found * AS DIRECTED     No current facility-administered medications for this visit.      Physical Exam:   BP 136/75 mmHg  Pulse 100  Resp 20  Ht 5\' 11" (1.803 m)  Wt 191 lb 8 oz (86.864 kg)  BMI 26.72 kg/m2  SpO2  92%  General:  Well-appearing  Chest:   Clear to auscultation  CV:   Regular rate and rhythm with soft systolic murmur  Incisions:  n/a  Abdomen:  Soft and nontender  Extremities:  Warm and well perfused, no lower extremity edema  Diagnostic Tests:  Transthoracic Echocardiography  Patient:    Biddle, Merlen O MR #:       [id number] Study Date: 04/02/2014 Gender:     M Age:        74 Height:     180.3 cm Weight:     90 .7 kg BSA:        2.15 m^2 Pt. Status: Room:   REFERRING    Mertie Moores, M.D.  SONOGRAPHER  [facility], [facility]  ATTENDING    [facility], Provider [phone]  [place], [place]  PERFORMING   [facility], Outpatient  ORDERING     Nahser, Jr  cc:  ------------------------------------------------------------------- LV EF: 65% -  70%  ------------------------------------------------------------------- Indications:      SOB (R06.02).  ------------------------------------------------------------------- History:   PMH:  History of HOCM. Acquired from the patient and from the patient&'s chart.  Atrial fibrillation.  Congestive heart failure, with an ejection fraction of 60%by echocardiography. The dysfunction is primarily diastolic.  Risk factors:  Hypertension. Diabetes mellitus.  ------------------------------------------------------------------- Study Conclusions  - Left ventricle: The cavity size was normal. Wall thickness was   increased in a pattern of moderate LVH. There was severe focal   basal hypertrophy of the septum (measures 2.2 cm). Systolic   function was vigorous. The estimated ejection fraction was in the   range of 65% to 70%. The study is not technically sufficient to   allow evaluation of LV diastolic function. - Aortic root: The aortic root is dilated. - Mitral valve: Mildly thickened leaflets. SAM. Trace to mild   regurgitation. - Left atrium: Severely dilated at 50 ml/m2. - Right ventricle: The cavity size was severely dilated.  Systolic   function was normal. Lateral annulus peak S velocity: 12.62 cm/s. - Right atrium: Severely dilated at 30 cm2. - Atrial septum: There was increased thickness of the septum,   consistent with lipomatous hypertrophy. - Tricuspid valve: Dilated annulus at 5.07 cm. There was moderate   regurgitation. - Pulmonic valve: The valve appears to be grossly normal. There was   mild regurgitation. - Pulmonary arteries: PA peak pressure: 40 mm Hg (S). - Inferior vena cava: The vessel was normal in size. The   respirophasic diameter changes were in the normal range (= 50%),   consistent with normal central venous pressure.  Impressions:  - Compared to the prior echo in 10/2013, the sinotubular junction   measures 4.5 cm. The most distally visualized aortic root   structure measures 4.3 cm. There is severe bilateral atrial   enlargment and severe RV dilitation, moderate TR and RVSP of 40   mmHg.  ------------------------------------------------------------------- Labs, prior tests, procedures, and surgery: Echocardiography (April 2015).     EF was 60%.  Cardioversion. Electrophysiology study with ablation. Coronary artery bypass grafting (April 2015).    Maze procedure. Transthoracic echocardiography.  M-mode, complete 2D, spectral Doppler, and color Doppler.  Birthdate:  Patient birthdate: [birth year].  Age:  Patient is 74 yr old.  Sex:  Gender: male. BMI: 27.9 kg/m^2.  Blood pressure:     130/78  Patient status: Outpatient.  Study date:  Study date: 04/02/2014. Study time: 04:24 PM.  Location:  [facility]  -------------------------------------------------------------------  ------------------------------------------------------------------- Left ventricle:  The cavity size was normal. Wall thickness was increased in a pattern of moderate LVH. There was severe focal basal hypertrophy of the septum (measures 2.2 cm). Systolic function was vigorous. The estimated ejection  fraction was in the range of 65% to 70%. Images were inadequate for LV wall motion assessment. The study is not technically sufficient to allow evaluation of LV diastolic function.  ------------------------------------------------------------------- Aortic valve:   Structurally normal valve. Trileaflet. Cusp separation was normal.  Doppler:  Transvalvular velocity was within the normal range. There was no stenosis. There was no regurgitation.  ------------------------------------------------------------------- Aorta:  Aortic root: The aortic root is dilated. Ascending aorta: The ascending aorta was normal in size.  ------------------------------------------------------------------- Mitral valve:  Mildly thickened leaflets. SAM. Trace to mild regurgitation.  ------------------------------------------------------------------- Left atrium:  Severely dilated at 50 ml/m2.  ------------------------------------------------------------------- Atrial septum:  There was increased thickness of the septum, consistent with lipomatous hypertrophy.  ------------------------------------------------------------------- Right ventricle:  The cavity size was severely  dilated. Systolic function was normal.  ------------------------------------------------------------------- Pulmonic valve:    The valve appears to be grossly normal. Doppler:  There was mild regurgitation.  ------------------------------------------------------------------- Tricuspid valve:  Dilated annulus at 5.07 cm.  Doppler:  There was moderate regurgitation.  ------------------------------------------------------------------- Pulmonary artery:   The main pulmonary artery was normal-sized.  ------------------------------------------------------------------- Right atrium:  Severely dilated at 30 cm2.  ------------------------------------------------------------------- Pericardium:  There was no pericardial  effusion.  ------------------------------------------------------------------- Systemic veins: Inferior vena cava: The vessel was normal in size. The respirophasic diameter changes were in the normal range (= 50%), consistent with normal central venous pressure.  ------------------------------------------------------------------- Measurements   Left ventricle                              Value        Reference  LV ID, ED, PLAX chordal                     45.2  mm     43 - 52  LV ID, ES, PLAX chordal                     23.3  mm     23 - 38  LV fx shortening, PLAX chordal              48    %      >=29  LV PW thickness, ED                         15.9  mm     ---------  IVS/LV PW ratio, ED                 (H)     1.38         <=1.3  Stroke volume, 2D                           91    ml     ---------  Stroke volume/bsa, 2D                       42    ml/m^2 ---------  LV e&', lateral                              8.81  cm/s   ---------  LV e&', medial                               6.05  cm/s   ---------  LV e&', average                              7.43  cm/s   ---------    Ventricular septum                          Value        Reference  IVS thickness, ED                           22.02 mm     ---------    LVOT  Value        Reference  LVOT ID, S                                  23    mm     ---------  LVOT area                                   4.15  cm^2   ---------  LVOT ID                                     23    mm     ---------  LVOT peak velocity, S                       108   cm/s   ---------  LVOT mean velocity, S                       81.5  cm/s   ---------  LVOT VTI, S                                 22    cm     ---------  Stroke volume (SV), LVOT DP                 91.4  ml     ---------  Stroke index (SV/bsa), LVOT DP              42.5  ml/m^2 ---------    Aorta                                       Value        Reference   Aortic root ID, M-L, ED                     42.96 mm     ---------    Left atrium                                 Value        Reference  LA ID, A-P, ES                              48    mm     ---------  LA ID/bsa, A-P                      (H)     2.23  cm/m^2 <=2.2  LA volume, S                                105   ml     ---------  LA volume/bsa, S                            48.9  ml/m^2 ---------  LA volume, ES, 1-p A4C                      133   ml     ---------  LA volume/bsa, ES, 1-p A4C                  61.9  ml/m^2 ---------  LA volume, ES, 1-p A2C                      81    ml     ---------  LA volume/bsa, ES, 1-p A2C                  37.7  ml/m^2 ---------    Pulmonary arteries                          Value        Reference  PA pressure, S, DP                  (H)     40    mm Hg  <=30    Tricuspid valve                             Value        Reference  Tricuspid regurg peak velocity              304   cm/s   ---------  Tricuspid peak RV-RA gradient               37    mm Hg  ---------  Tricuspid maximal regurg velocity,          304   cm/s   ---------  PISA    Systemic veins                              Value        Reference  Estimated CVP                               3     mm Hg  ---------    Right ventricle                             Value        Reference  RV pressure, S, DP                  (H)     40    mm Hg  <=30  RV s&', lateral, S                           12.62 cm/s   ---------  Legend: (L)  and  (H)  mark values outside specified reference range.  ------------------------------------------------------------------- Prepared and Electronically Authenticated by  Lyman Bishop MD 2016-03-28T17:30:55    CT ANGIOGRAPHY CHEST WITH CONTRAST   TECHNIQUE: Multidetector CT imaging of the chest was performed using the standard protocol during bolus administration of intravenous contrast. Multiplanar CT image reconstructions and MIPs were obtained to  evaluate the vascular anatomy.   CONTRAST:  66mL OMNIPAQUE IOHEXOL 350 MG/ML SOLN   COMPARISON:  03/28/2013.   FINDINGS: The lungs are well aerated bilaterally. No focal confluent infiltrate is seen. A small left-sided pleural effusion is noted and stable from the prior exam.   The thoracic aorta and its branches are within normal limits. Atherosclerotic calcifications are noted within the aorta. There are changes consistent with prior clipping of the left atrium. No residual opacification of the left atrial appendage is noted. The pulmonary artery is well visualized and demonstrates a normal branching pattern. No filling defects to suggest pulmonary embolism are identified. Small mediastinal lymph nodes are noted. These are not significant by size criteria.   Scanning into the upper abdomen demonstrates a left adrenal lesion which is stable from a previous exam from 2015.   Review of the MIP images confirms the above findings.   IMPRESSION: No evidence of pulmonary embolism.   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   Small left-sided pleural effusion which is new from the previous CT but stable from recent chest x-ray.   Postsurgical changes.     Electronically Signed   By: Inez Catalina M.D.   On: 04/03/2014 16:04    CARDIOPULMONARY EXERCISE TEST  Referred for: Dyspnea  Procedure: This patient underwent staged symptom-limited exercise treadmill testing using a modified Naughton protocol with expired gas analysis metabolic evaluation during exercise.  Demographics  Age: 41 Ht. (in.) 91 Wt. (lb) 201 BMI: 28      Predicted Peak VO2: 25.3 ml/kg/min  Gender: Male Ht (cm) 180.3 Wt. (kg) 91.2    Results  Pre-Exercise PFTs   FVC 2.05 (48%)       FEV1 1.54 (46%)         FEV1/FVC 75 (98%)         MVV 72 (57%)          Exercise Time:    6:31   Speed (mph): 2.0       Grade (%): 7.0      RPE: 15  Reason stopped: Patient ended  test due to dyspnea (7/10)  Additional symptoms: None reported  Resting HR: 72 Peak HR: 101   (70% age predicted max HR)  BP rest: 102/60 BP peak: 158/70  Peak VO2: 12.1 (47.8% predicted peak VO2)  VE/VCO2 slope:  38.6  OUES: 1.53  Peak RER: 1.10  Ventilatory Threshold: 10.1 (40% predicted and 83.5% measured peak VO2)  Peak RR 38  Peak Ventilation:  48.5  VE/MVV:  67.4%  PETCO2 at peak:  32  O2pulse:  11   (69% predicted O2pulse)   Interpretation  Notes: Patient gave a good effort. Pulse oximetry began at 95% and desaturated to 88% at peak exercise and recovered to 91%.   ECG:  Resting ECG in sinus rhythm with 1st degree AV block. The HR response was blunted. There were no sustained arrhythmias and no ST-T changes throughout the exercise. The BP response was appropriate for the work performed.   PFT:  Pre-exercise spirometry demonstrates a moderate obstruction. The MVV was well below normal.   CPX:  Exercise testing with gas exchange demonstrates a moderately reduced peak VO2 of 12.1 ml/kg/min (47.8% of the age/gender/weight matched sedentary norms). The RER of 1.10 indicates a maximum effort. When adjusted to the patient's ideal body weight of 180.3 lb (81.8 kg) the peak VO2 is 13.5 ml/kg (ibw)/min (53% of the ibw-adjusted predicted). The VE/VCO2 slope is elevated and indicates excessive dead space ventilation. The oxygen uptake efficiency slope (OUES) is moderately reduced and is reflective of patient's measured  functional capacity. The VO2 at the ventilatory threshold was normal at 40% of the predicted peak VO2. At peak exercise, the ventilation reached 67.4% of the measured MVV indicating ventilatory reserve remained. The O2pulse (a surrogate for stroke volume) increased throughout the exercise reaching 11 ml/beat. (69% predicted).    Conclusion: Exercise testing with gas-exchange demonstrates a moderate to severely reduced functional capacity when  compared to matched sedentary norms. Pre-exercise spirometry suggests moderate obstruction/restriction. There is evidence of a significant circulatory/HF limitation as well as pulmonary limitation with exertional desaturation.  This combination can be seen in HF with underlying lung disease as well as pulmonary HTN. There was also chronotropic incompetence noted. Would consider full PFTs with DLCO and RHC at rest and possibly with exercise.   Test, report and preliminary impression by: Landis Martins, MS, ACSM-RCEP 04/23/2014 2:56 PM  Preliminary CPX Results, Finalized results will be forwarded when completed by interpreting physician.  I have edited the results with my interpretation.   Daniel Bensimhon,MD 4:29 PM     Impression:  The patient is now approximately one year status post coronary artery bypass grafting 2 and maze procedure. His early postoperative recovery was slow and he experienced significant problems with recurrent atypical atrial flutter for which he eventually underwent ablation by Dr. Rayann Heman last fall.  Over the past 2 months the patient has had a somewhat acute worsening of symptoms of exertional shortness of breath consistent with chronic diastolic congestive heart failure. He has not had any documented recurrence of atrial fibrillation or atrial flutter, although his resting pulse is somewhat elevated in our office today.  Recent transthoracic echocardiogram demonstrates the presence of severe right ventricular chamber enlargement with at least moderate tricuspid regurgitation and pulmonary hypertension. The patient has underlying moderate left ventricular hypertrophy with hyperdynamic left ventricular systolic function.  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 The patient has not had symptoms to suggest the presence of ongoing myocardial  ischemia, and recent cardiopulmonary excise testing was suggestive of symptoms related to congestive heart failure.   Plan:  The patient plans to follow-up with [name] in the advanced heart failure clinic in the near future. Repeat 12-lead electrocardiogram might be prudent to make certain the patient has not developed a recurrence of atrial flutter.  Right heart catheterization and transesophageal echocardiogram might be useful.  We have not recommended any changes in the patient's current medications at this time.  I spent in excess of 30 minutes during the conduct of this office consultation and >50% of this time involved direct face-to-face encounter with the patient for counseling and/or coordination of their care.   Valentina Gu. Roxy Manns, MD 04/30/2014 2:20 PM

## 2014-05-01 ENCOUNTER — Other Ambulatory Visit (INDEPENDENT_AMBULATORY_CARE_PROVIDER_SITE_OTHER): Payer: Medicare Other | Admitting: *Deleted

## 2014-05-01 ENCOUNTER — Telehealth: Payer: Self-pay | Admitting: Nurse Practitioner

## 2014-05-01 ENCOUNTER — Encounter: Payer: Self-pay | Admitting: Nurse Practitioner

## 2014-05-01 DIAGNOSIS — I5032 Chronic diastolic (congestive) heart failure: Secondary | ICD-10-CM

## 2014-05-01 LAB — CBC WITH DIFFERENTIAL/PLATELET
BASOS ABS: 0 10*3/uL (ref 0.0–0.1)
BASOS PCT: 0.2 % (ref 0.0–3.0)
EOS ABS: 0.1 10*3/uL (ref 0.0–0.7)
EOS PCT: 1 % (ref 0.0–5.0)
HCT: 33.1 % — ABNORMAL LOW (ref 39.0–52.0)
HEMOGLOBIN: 11 g/dL — AB (ref 13.0–17.0)
LYMPHS ABS: 0.6 10*3/uL — AB (ref 0.7–4.0)
LYMPHS PCT: 9.1 % — AB (ref 12.0–46.0)
MCHC: 33.3 g/dL (ref 30.0–36.0)
MCV: 92.1 fl (ref 78.0–100.0)
MONO ABS: 0.4 10*3/uL (ref 0.1–1.0)
Monocytes Relative: 5.8 % (ref 3.0–12.0)
NEUTROS ABS: 6 10*3/uL (ref 1.4–7.7)
Neutrophils Relative %: 83.9 % — ABNORMAL HIGH (ref 43.0–77.0)
Platelets: 257 10*3/uL (ref 150.0–400.0)
RBC: 3.59 Mil/uL — AB (ref 4.22–5.81)
RDW: 15.1 % (ref 11.5–15.5)
WBC: 7.1 10*3/uL (ref 4.0–10.5)

## 2014-05-01 LAB — BASIC METABOLIC PANEL
BUN: 25 mg/dL — AB (ref 6–23)
CALCIUM: 9.3 mg/dL (ref 8.4–10.5)
CO2: 27 meq/L (ref 19–32)
Chloride: 106 mEq/L (ref 96–112)
Creatinine, Ser: 1.11 mg/dL (ref 0.40–1.50)
GFR: 68.62 mL/min (ref >60.00)
Glucose, Bld: 313 mg/dL — ABNORMAL HIGH (ref 70–99)
Potassium: 3.9 mEq/L (ref 3.5–5.1)
Sodium: 139 mEq/L (ref 135–145)

## 2014-05-01 LAB — PROTIME-INR
INR: 1.9 ratio — AB (ref 0.8–1.0)
Prothrombin Time: 20.5 s — ABNORMAL HIGH (ref 9.6–13.1)

## 2014-05-01 NOTE — Telephone Encounter (Signed)
Spoke with patient who is scheduled for right heart cath on Thursday 4/28 with [name] per Dr. Acie Fredrickson for results of CPX and persistent SOB.  Patient is coming to [place] today for lab work.  I advised him to have the front desk call me when he arrives so that I may review his pre-cath instructions with him.  Lab orders are in epic.  Patient verbalized understanding and agreement with plan of care.

## 2014-05-01 NOTE — Addendum Note (Signed)
Addended by: Eulis Foster on: 05/01/2014 11:21 AM   Modules accepted: Orders

## 2014-05-02 ENCOUNTER — Other Ambulatory Visit: Payer: Self-pay | Admitting: Cardiovascular Disease

## 2014-05-02 DIAGNOSIS — R0600 Dyspnea, unspecified: Secondary | ICD-10-CM

## 2014-05-03 ENCOUNTER — Telehealth: Payer: Self-pay | Admitting: Cardiovascular Disease

## 2014-05-03 ENCOUNTER — Ambulatory Visit (HOSPITAL_COMMUNITY)
Admission: RE | Admit: 2014-05-03 | Discharge: 2014-05-03 | Disposition: A | Discharge status: Home / Self Care | Payer: Medicare Other | Source: Ambulatory Visit | Attending: Internal Medicine | Admitting: Internal Medicine

## 2014-05-03 ENCOUNTER — Encounter (HOSPITAL_COMMUNITY): Payer: Self-pay | Admitting: Internal Medicine

## 2014-05-03 ENCOUNTER — Encounter (HOSPITAL_COMMUNITY)
Admission: RE | Disposition: A | Discharge status: Home / Self Care | Payer: Self-pay | Source: Ambulatory Visit | Attending: Internal Medicine

## 2014-05-03 ENCOUNTER — Other Ambulatory Visit: Payer: Self-pay | Admitting: *Deleted

## 2014-05-03 DIAGNOSIS — Z7901 Long term (current) use of anticoagulants: Secondary | ICD-10-CM | POA: Diagnosis not present

## 2014-05-03 DIAGNOSIS — Z951 Presence of aortocoronary bypass graft: Secondary | ICD-10-CM | POA: Insufficient documentation

## 2014-05-03 DIAGNOSIS — F329 Major depressive disorder, single episode, unspecified: Secondary | ICD-10-CM | POA: Insufficient documentation

## 2014-05-03 DIAGNOSIS — Z91018 Allergy to other foods: Secondary | ICD-10-CM | POA: Insufficient documentation

## 2014-05-03 DIAGNOSIS — Z87891 Personal history of nicotine dependence: Secondary | ICD-10-CM | POA: Diagnosis not present

## 2014-05-03 DIAGNOSIS — Z887 Allergy status to serum and vaccine status: Secondary | ICD-10-CM | POA: Diagnosis not present

## 2014-05-03 DIAGNOSIS — Z86010 Personal history of colonic polyps: Secondary | ICD-10-CM | POA: Insufficient documentation

## 2014-05-03 DIAGNOSIS — I272 Other secondary pulmonary hypertension: Secondary | ICD-10-CM | POA: Diagnosis not present

## 2014-05-03 DIAGNOSIS — E119 Type 2 diabetes mellitus without complications: Secondary | ICD-10-CM | POA: Diagnosis not present

## 2014-05-03 DIAGNOSIS — K5730 Diverticulosis of large intestine without perforation or abscess without bleeding: Secondary | ICD-10-CM | POA: Insufficient documentation

## 2014-05-03 DIAGNOSIS — G4733 Obstructive sleep apnea (adult) (pediatric): Secondary | ICD-10-CM | POA: Insufficient documentation

## 2014-05-03 DIAGNOSIS — Z981 Arthrodesis status: Secondary | ICD-10-CM | POA: Diagnosis not present

## 2014-05-03 DIAGNOSIS — I110 Hypertensive heart disease with heart failure: Secondary | ICD-10-CM | POA: Diagnosis not present

## 2014-05-03 DIAGNOSIS — R0600 Dyspnea, unspecified: Secondary | ICD-10-CM

## 2014-05-03 DIAGNOSIS — I4892 Unspecified atrial flutter: Secondary | ICD-10-CM | POA: Insufficient documentation

## 2014-05-03 DIAGNOSIS — I2510 Atherosclerotic heart disease of native coronary artery without angina pectoris: Secondary | ICD-10-CM | POA: Insufficient documentation

## 2014-05-03 DIAGNOSIS — I5032 Chronic diastolic (congestive) heart failure: Secondary | ICD-10-CM | POA: Insufficient documentation

## 2014-05-03 DIAGNOSIS — F419 Anxiety disorder, unspecified: Secondary | ICD-10-CM | POA: Insufficient documentation

## 2014-05-03 DIAGNOSIS — Z85828 Personal history of other malignant neoplasm of skin: Secondary | ICD-10-CM | POA: Insufficient documentation

## 2014-05-03 DIAGNOSIS — Q211 Atrial septal defect: Secondary | ICD-10-CM | POA: Insufficient documentation

## 2014-05-03 DIAGNOSIS — I43 Cardiomyopathy in diseases classified elsewhere: Secondary | ICD-10-CM | POA: Diagnosis not present

## 2014-05-03 DIAGNOSIS — K219 Gastro-esophageal reflux disease without esophagitis: Secondary | ICD-10-CM | POA: Diagnosis not present

## 2014-05-03 DIAGNOSIS — I270 Primary pulmonary hypertension: Secondary | ICD-10-CM | POA: Diagnosis not present

## 2014-05-03 DIAGNOSIS — E785 Hyperlipidemia, unspecified: Secondary | ICD-10-CM | POA: Diagnosis not present

## 2014-05-03 DIAGNOSIS — I481 Persistent atrial fibrillation: Secondary | ICD-10-CM | POA: Insufficient documentation

## 2014-05-03 DIAGNOSIS — R0902 Hypoxemia: Secondary | ICD-10-CM

## 2014-05-03 HISTORY — PX: RIGHT HEART CATHETERIZATION: SHX5447

## 2014-05-03 LAB — POCT I-STAT 3, VENOUS BLOOD GAS (G3P V)
ACID-BASE DEFICIT: 1 mmol/L (ref 0.0–2.0)
ACID-BASE DEFICIT: 2 mmol/L (ref 0.0–2.0)
ACID-BASE DEFICIT: 3 mmol/L — AB (ref 0.0–2.0)
Acid-base deficit: 1 mmol/L (ref 0.0–2.0)
Acid-base deficit: 1 mmol/L (ref 0.0–2.0)
Acid-base deficit: 1 mmol/L (ref 0.0–2.0)
Acid-base deficit: 2 mmol/L (ref 0.0–2.0)
Acid-base deficit: 2 mmol/L (ref 0.0–2.0)
Acid-base deficit: 2 mmol/L (ref 0.0–2.0)
BICARBONATE: 22.1 meq/L (ref 20.0–24.0)
BICARBONATE: 22.5 meq/L (ref 20.0–24.0)
BICARBONATE: 22.6 meq/L (ref 20.0–24.0)
BICARBONATE: 23 meq/L (ref 20.0–24.0)
Bicarbonate: 21.8 mEq/L (ref 20.0–24.0)
Bicarbonate: 22.4 mEq/L (ref 20.0–24.0)
Bicarbonate: 22.6 mEq/L (ref 20.0–24.0)
Bicarbonate: 22.7 mEq/L (ref 20.0–24.0)
Bicarbonate: 21.6 mEq/L (ref 20.0–24.0)
O2 SAT: 66 %
O2 SAT: 68 %
O2 SAT: 56 %
O2 Saturation: 54 %
O2 Saturation: 55 %
O2 Saturation: 68 %
O2 Saturation: 69 %
O2 Saturation: 79 %
O2 Saturation: 81 %
PCO2 VEN: 33 mmHg — AB (ref 45.0–50.0)
PCO2 VEN: 34.4 mmHg — AB (ref 45.0–50.0)
PCO2 VEN: 34.4 mmHg — AB (ref 45.0–50.0)
PCO2 VEN: 36.8 mmHg — AB (ref 45.0–50.0)
PCO2 VEN: 34.6 mmHg — AB (ref 45.0–50.0)
PH VEN: 7.404 — AB (ref 7.250–7.300)
PH VEN: 7.423 — AB (ref 7.250–7.300)
PH VEN: 7.425 — AB (ref 7.250–7.300)
PH VEN: 7.429 — AB (ref 7.250–7.300)
PH VEN: 7.431 — AB (ref 7.250–7.300)
PO2 VEN: 28 mmHg — AB (ref 30.0–45.0)
PO2 VEN: 33 mmHg (ref 30.0–45.0)
PO2 VEN: 29 mmHg — AB (ref 30.0–45.0)
TCO2: 23 mmol/L (ref 0–100)
TCO2: 23 mmol/L (ref 0–100)
TCO2: 23 mmol/L (ref 0–100)
TCO2: 24 mmol/L (ref 0–100)
TCO2: 24 mmol/L (ref 0–100)
TCO2: 24 mmol/L (ref 0–100)
TCO2: 24 mmol/L (ref 0–100)
TCO2: 24 mmol/L (ref 0–100)
TCO2: 23 mmol/L (ref 0–100)
pCO2, Ven: 33.6 mmHg — ABNORMAL LOW (ref 45.0–50.0)
pCO2, Ven: 33.8 mmHg — ABNORMAL LOW (ref 45.0–50.0)
pCO2, Ven: 34.5 mmHg — ABNORMAL LOW (ref 45.0–50.0)
pCO2, Ven: 36.2 mmHg — ABNORMAL LOW (ref 45.0–50.0)
pH, Ven: 7.404 — ABNORMAL HIGH (ref 7.250–7.300)
pH, Ven: 7.424 — ABNORMAL HIGH (ref 7.250–7.300)
pH, Ven: 7.426 — ABNORMAL HIGH (ref 7.250–7.300)
pH, Ven: 7.403 — ABNORMAL HIGH (ref 7.250–7.300)
pO2, Ven: 29 mmHg — CL (ref 30.0–45.0)
pO2, Ven: 34 mmHg (ref 30.0–45.0)
pO2, Ven: 34 mmHg (ref 30.0–45.0)
pO2, Ven: 35 mmHg (ref 30.0–45.0)
pO2, Ven: 42 mmHg (ref 30.0–45.0)
pO2, Ven: 43 mmHg (ref 30.0–45.0)

## 2014-05-03 LAB — GLUCOSE, CAPILLARY
GLUCOSE-CAPILLARY: 144 mg/dL — AB (ref 70–99)
Glucose-Capillary: 128 mg/dL — ABNORMAL HIGH (ref 70–99)

## 2014-05-03 SURGERY — RIGHT HEART CATH

## 2014-05-03 MED ORDER — SODIUM CHLORIDE 0.9 % IV SOLN
INTRAVENOUS | Status: DC
  Administered 2014-05-03: 09:00:00 via INTRAVENOUS

## 2014-05-03 MED ORDER — SODIUM CHLORIDE 0.9 % IJ SOLN: 3.0000 mL | Freq: Two times a day (BID) | INTRAMUSCULAR | Status: DC

## 2014-05-03 MED ORDER — SODIUM CHLORIDE 0.9 % IJ SOLN: 3.0000 mL | INTRAMUSCULAR | Status: DC | PRN

## 2014-05-03 MED ORDER — ASPIRIN 81 MG PO CHEW
81.0000 mg | CHEWABLE_TABLET | ORAL | Status: AC
  Administered 2014-05-03: 81 mg via ORAL

## 2014-05-03 MED ORDER — ONDANSETRON HCL 4 MG/2ML IJ SOLN: 4.0000 mg | Freq: Four times a day (QID) | INTRAMUSCULAR | Status: DC | PRN

## 2014-05-03 MED ORDER — SODIUM CHLORIDE 0.9 % IV SOLN: 250.0000 mL | INTRAVENOUS | Status: DC | PRN

## 2014-05-03 MED ORDER — ACETAMINOPHEN 325 MG PO TABS: 650.0000 mg | ORAL_TABLET | ORAL | Status: DC | PRN

## 2014-05-03 MED ORDER — HEPARIN (PORCINE) IN NACL 2-0.9 UNIT/ML-% IJ SOLN
INTRAMUSCULAR | Status: AC
  Filled 2014-05-03: qty 1000

## 2014-05-03 MED ORDER — ASPIRIN 81 MG PO CHEW
CHEWABLE_TABLET | ORAL | Status: AC
  Filled 2014-05-03: qty 1

## 2014-05-03 MED ORDER — LIDOCAINE HCL (PF) 1 % IJ SOLN
INTRAMUSCULAR | Status: AC
  Filled 2014-05-03: qty 30

## 2014-05-03 NOTE — Progress Notes (Signed)
Site area: rt neck Site Prior to Removal:  Level  0 Pressure Applied For:  20 minutes Manual:   yes Patient Status During Pull:  stable Post Pull Site:  Level  0 Post Pull Instructions Given:  yes Post Pull Pulses Present: NA Dressing Applied:  Small tegaderm Bedrest begins @  1120  Comments:  none

## 2014-05-03 NOTE — Discharge Instructions (Signed)
CALL IF ANY PROBLEMS,QUESTIONS, OR CONCERNS; CALL IF ANY REDNESS,DRAINAGE,FEVER,PAIN, OR SWELLING AT RIGHT NECK SITE

## 2014-05-03 NOTE — Interval H&P Note (Signed)
History and Physical Interval Note:  05/03/2014 9:32 AM  [name]  has presented today for surgery, with the diagnosis of hf/shortness of breath  The various methods of treatment have been discussed with the patient and family. After consideration of risks, benefits and other options for treatment, the patient has consented to  Procedure(s): RIGHT HEART CATH (N/A) with exercise as a surgical intervention .  The patient's history has been reviewed, patient examined, no change in status, stable for surgery.  I have reviewed the patient's chart and labs.  Questions were answered to the patient's satisfaction.     Daniel Bensimhon

## 2014-05-03 NOTE — Telephone Encounter (Signed)
Notes from [name] from cath this am  Mr. 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  He will be scheduled to have a cardiac CT to look for the shunt.  O2 sats are 88-90% at rest.   He will need to have home health O2.   Please set up for 2 lpm ( or higher if needed to keeps sats up > 90% with exertion.  Will arrange .  Thayer Headings, Brooke Bonito., MD, [facility] 05/03/2014, 11:25 AM [address]  [place] Pager [phone]

## 2014-05-03 NOTE — Consult Note (Signed)
Advanced Heart Failure Team Consult Note  Reason for Consultation: Dyspnea  HPI:    74 y/o male with CAD s/p CABG and AF with progressive dyspnea and hypoxemia over past 3 months.  Has had extensive work-up which has been unrevealing. Recent CPX test has suggested possible PAH. He is referred for RHC +/- exercise.   Review of Systems: [y] = yes, [ ]  = no   General: Weight gain [ ] ; Weight loss [ ] ; Anorexia [ ] ; Fatigue [ y]; Fever [ ] ; Chills [ ] ; Weakness [ y]  Cardiac: Chest pain/pressure [ ] ; Resting SOB [ y]; Exertional SOB [ y]; Orthopnea [ ] ; Pedal Edema [ y]; Palpitations [ ] ; Syncope [ ] ; Presyncope [ ] ; Paroxysmal nocturnal dyspnea[ ]   Pulmonary: Cough [ ] ; Wheezing[ ] ; Hemoptysis[ ] ; Sputum [ ] ; Snoring [ ]   GI: Vomiting[ ] ; Dysphagia[ ] ; Melena[ ] ; Hematochezia [ ] ; Heartburn[ ] ; Abdominal pain [ ] ; Constipation [ ] ; Diarrhea [ ] ; BRBPR [ ]   GU: Hematuria[ ] ; Dysuria [ ] ; Nocturia[ ]   Vascular: Pain in legs with walking [ ] ; Pain in feet with lying flat [ ] ; Non-healing sores [ ] ; Stroke [ ] ; TIA [ ] ; Slurred speech [ ] ;  Neuro: Headaches[ ] ; Vertigo[ ] ; Seizures[ ] ; Paresthesias[ ] ;Blurred vision [ ] ; Diplopia [ ] ; Vision changes [ ]   Ortho/Skin: Arthritis Blue.Reese ]; Joint pain Blue.Reese ]; Muscle pain [ ] ; Joint swelling [ ] ; Back Pain [ ] ; Rash [ ]   Psych: Depression[ ] ; Anxiety[ ]   Heme: Bleeding problems [ ] ; Clotting disorders [ ] ; Anemia [ ]   Endocrine: Diabetes [ ] ; Thyroid dysfunction[ ]   Home Medications Prior to Admission medications   Medication Sig Start Date End Date Taking? Authorizing Provider  acetaminophen (TYLENOL) 500 MG tablet Take 1,000 mg by mouth every 6 (six) hours as needed for mild pain.    Yes Historical Provider, MD  ALPRAZolam Duanne Moron) 1 MG tablet Take 0.5 mg by mouth at bedtime as needed for sleep.    Yes Historical Provider, MD  atorvastatin (LIPITOR) 40 MG tablet Take 20 mg by mouth every Monday, Wednesday, and Friday.    Yes Historical Provider, MD  B  Complex Vitamins (VITAMIN B COMPLEX PO) Take 1 tablet by mouth daily.   Yes Historical Provider, MD  Besifloxacin HCl 0.6 % SUSP Place 1 drop into the left eye See admin instructions. Instill 1 drop into the left eye 4 times daily for 2 days after injection done at Dr. Tempie Hoist office (last injection 04/24/14) - Injections are done every 7 weeks   Yes Historical Provider, MD  carvedilol (COREG) 6.25 MG tablet Take 6.25 mg by mouth 2 (two) times daily with a meal.   Yes Historical Provider, MD  cholecalciferol (VITAMIN D) 1000 UNITS tablet Take 5,000 Units by mouth daily.   Yes Historical Provider, MD  cloNIDine (CATAPRES) 0.1 MG tablet Take 0.5 tablets (0.05 mg total) by mouth 2 (two) times daily. 03/29/14  Yes Thayer Headings, MD  diphenhydrAMINE (BENADRYL) 25 MG tablet Take 25 mg by mouth at bedtime as needed for allergies.    Yes Historical Provider, MD  furosemide (LASIX) 40 MG tablet Take 2 tablets (80 mg total) by mouth daily. Patient taking differently: Take 80 mg by mouth See admin instructions. Take 2 tablets (80 mg) every morning, may take 1 additional tablet in the afternoon as needed for ankle swelling 12/18/13  Yes Thayer Headings, MD  glimepiride (AMARYL) 2 MG tablet Take 1  mg by mouth daily with breakfast.    Yes Historical Provider, MD  HYDROcodone-acetaminophen (NORCO/VICODIN) 5-325 MG per tablet Take 1 tablet by mouth every 6 (six) hours as needed for moderate pain. Patient taking differently: Take 1 tablet by mouth at bedtime as needed for moderate pain.  12/14/13  Yes [name], MD  metFORMIN (GLUCOPHAGE-XR) 500 MG 24 hr tablet Take 1,000 mg by mouth 2 (two) times daily before a meal.  07/17/13  Yes Historical Provider, MD  minoxidil (LONITEN) 10 MG tablet Take 20 mg by mouth at bedtime.    Yes Historical Provider, MD  Multiple Vitamin (MULTIVITAMIN WITH MINERALS) TABS tablet Take 1 tablet by mouth daily.   Yes Historical Provider, MD  potassium chloride SA (K-DUR,KLOR-CON)  20 MEQ tablet Take 2 tablets (40 mEq total) by mouth daily. Patient taking differently: Take 20 mEq by mouth 2 (two) times daily.  12/18/13  Yes Thayer Headings, MD  sertraline (ZOLOFT) 100 MG tablet TAKE 1 TABLET BY MOUTH DAILY 12/04/13  Yes [name], MD  valsartan (DIOVAN) 320 MG tablet Take 1 tablet (320 mg total) by mouth daily. 01/03/14  Yes Thayer Headings, MD  warfarin (COUMADIN) 5 MG tablet Take 7.5-10 mg by mouth daily after supper. Take 2 tablets (10 mg) Monday, Wednesday, Friday, take 1 1/2 tablets (7.5 mg) on Sunday, Tuesday, Thursday, Saturday 09/26/13  Yes Vicie Mutters, PA-C  ranitidine (ZANTAC) 150 MG tablet Take 150 mg by mouth daily as needed for heartburn.     Historical Provider, MD    Past Medical History: Past Medical History  Diagnosis Date  . Hx of adenomatous colonic polyps   . Diabetes mellitus type II   . Hypertension   . Diverticulosis 2001  . Hyperlipidemia   . Persistent atrial fibrillation     a. s/p MAZE 04/2013 in setting of CABG. b. Amio stopped in 10/2013 after flutter ablation.  . Obstructive sleep apnea     compliant with CPAP  . Chronic diastolic congestive heart failure   . Hypertensive cardiomyopathy   . History of cardioversion     x3 (years uncertain)  . H/O hiatal hernia   . Basal cell carcinoma   . Adrenal adenoma   . CAD (coronary artery disease)     a. 04/2013 CABG x 2: LIMA to LAD, SVG to RI, EVH via R thigh.  . S/P Maze operation for atrial fibrillation     a. 04/2013: Complete bilateral atrial lesion set using cryothermy and bipolar radiofrequency ablation with clipping of LA appendage (@ time of CABG)  . GERD (gastroesophageal reflux disease)   . Depression   . Anxiety   . DJD (degenerative joint disease)   . 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 DCCV 08/2013. b. s/p RFA 10/2013.  [name] Respiratory failure     a. Hypoxia 10/2013 - required supp O2 as inpatient, did not require it at discharge.  . Pleural effusion, left      a. s/p thoracentesis 05/2013.  [name] PFO (patent foramen ovale)     a. Small PFO by TEE 10/2013.    Past Surgical History: Past Surgical History  Procedure Laterality Date  . Polinydal cyst      Removed  . Great toe arthrodesis, interphalangeal joint      Right foot  . Retina repair-right    . Cataract extraction      bilateral  . Basal cell carcinoma excision      x3 on face  .  Polypectomy    . Cardiac catheterization      myocardial bridge but no cad  . Coronary artery bypass graft N/A 04/05/2013    Procedure: CORONARY ARTERY BYPASS GRAFTING (CABG) TIMES TWO USING LEFT INTERNAL MAMMARY ARTERY AND RIGHT SAPHENOUS LEG VEIN HARVESTED ENDOSCOPICALLY;  Surgeon: Rexene Alberts, MD;  Location: [place];  Service: Open Heart Surgery;  Laterality: N/A;  . Maze N/A 04/05/2013    Procedure: MAZE;  Surgeon: Rexene Alberts, MD;  Location: [place];  Service: Open Heart Surgery;  Laterality: N/A;  . Intraoperative transesophageal echocardiogram N/A 04/05/2013    Procedure: INTRAOPERATIVE TRANSESOPHAGEAL ECHOCARDIOGRAM;  Surgeon: Rexene Alberts, MD;  Location: [place];  Service: Open Heart Surgery;  Laterality: N/A;  . Tee without cardioversion N/A 08/23/2013    Procedure: TRANSESOPHAGEAL ECHOCARDIOGRAM (TEE);  Surgeon: Sanda Klein, MD;  Location: [place];  Service: Cardiovascular;  Laterality: N/A;  . Cardioversion N/A 08/23/2013    Procedure: CARDIOVERSION;  Surgeon: Sanda Klein, MD;  Location: [facility] ENDOSCOPY;  Service: Cardiovascular;  Laterality: N/A;  . Tee without cardioversion N/A 10/26/2013    Procedure: TRANSESOPHAGEAL ECHOCARDIOGRAM (TEE);  Surgeon: Sueanne Margarita, MD;  Location: [facility] ENDOSCOPY;  Service: Cardiovascular;  Laterality: N/A;  . Left heart catheterization with coronary angiogram N/A 03/07/2013    Procedure: LEFT HEART CATHETERIZATION WITH CORONARY ANGIOGRAM;  Surgeon: Burnell Blanks, MD;  Location: [facility] CATH LAB;  Service: Cardiovascular;  Laterality: N/A;  . Atrial fibrillation  ablation N/A 10/26/2013    Procedure: ATRIAL FIBRILLATION ABLATION;  Surgeon: Coralyn Mark, MD;  Location: [place] CATH LAB;  Service: Cardiovascular;  Laterality: N/A;  . Carpometacarpel suspension plasty Left 02/14/2014    Procedure: CARPOMETACARPEL ([place]) SUSPENSIONPLASTY THUMB  WITH  ABDUCTOR POLLICIS LONGUS TRANSFER AND STENOSING TENOSYNOVITIS RELEASE LEFT WRIST;  Surgeon: Charlotte Crumb, MD;  Location: [place];  Service: Orthopedics;  Laterality: Left;  . Trapezium resection      Family History: Family History  Problem Relation Age of Onset  . Colon cancer Mother     Family History/Uncle   . Colon polyps Mother     Family History  . Atrial fibrillation Mother   . Hypertension Mother   . Colon polyps Sister     Family history  . Diabetes Maternal Uncle   . Stroke Paternal Uncle   . Dementia Father     Social History: History   Social History  . Marital Status: Married    Spouse Name: N/A  . Number of Children: 1  . Years of Education: N/A   Occupational History  . retired Software engineer    Social History Main Topics  . Smoking status: Former Smoker -- 4.00 packs/day for 25 years    Types: Cigarettes    Quit date: 01/05/1981  . Smokeless tobacco: Never Used  . Alcohol Use: Yes     Comment: 1-5 drinks per week  . Drug Use: No  . Sexual Activity: Not on file   Other Topics Concern  . Not on file   Social History Narrative   Daily caffeine-yes   Patient gets regular exercise.   Pt lives in [place] with spouse.  Retired Software engineer.  Allergies:  Allergies  Allergen Reactions  . Sunflower Seed [Sunflower Oil] Swelling and Other (See Comments)    Tongue and lip swelling  . Horse-Derived Products Other (See Comments)    Per allergy skin test  . Tetanus Toxoids Other (See Comments)    Per allergy skin test    Objective:    Vital Signs:   Temp:  [98.1 F (36.7 C)] 98.1 F (36.7 C) (04/28 0658) Pulse Rate:  [71] 71 (04/28 0658) Resp:  [18] 18 (04/28 0658) BP: (130)/(67) 130/67 mmHg (04/28 0658) SpO2:  [93 %] 93 % (04/28 0658) Weight:  [86.24 kg (190 lb 2 oz)] 86.24 kg (190 lb 2 oz) (04/28 0658)    Weight change: Filed Weights   05/03/14 0658  Weight: 86.24 kg (190 lb 2 oz)    Intake/Output:  No intake or output data in the 24 hours ending 05/03/14 0926   Physical Exam: General:  Elderly Well appearing. No resp difficulty HEENT: normal Neck: supple. JVP 8 . Carotids 2+ bilat; no bruits. No lymphadenopathy or thryomegaly appreciated. Cor: PMI nondisplaced. Regular rate & rhythm. No rubs, gallops or murmurs. Lungs: clear Abdomen: soft, nontender, nondistended. No hepatosplenomegaly. No bruits or masses. Good bowel sounds. Extremities: no cyanosis, clubbing, rash, edema Neuro: alert & orientedx3, cranial nerves grossly intact. moves all 4 extremities w/o  difficulty. Affect pleasant  Telemetry: Sinus 70s  Labs: Basic Metabolic Panel:  Recent Labs Lab 05/01/14 1122  NA 139  K 3.9  CL 106  CO2 27  GLUCOSE 313*  BUN 25*  CREATININE 1.11  CALCIUM 9.3    Liver Function Tests: No results for input(s): AST, ALT, ALKPHOS, BILITOT, PROT, ALBUMIN in the last 168 hours. No results for input(s): LIPASE, AMYLASE in the last 168 hours. No results for input(s): AMMONIA in the last 168 hours.  CBC:  Recent Labs Lab 05/01/14 1122  WBC 7.1  NEUTROABS 6.0  HGB 11.0*  HCT 33.1*  MCV 92.1  PLT 257.0    Cardiac Enzymes: No results for input(s): CKTOTAL, CKMB, CKMBINDEX, TROPONINI in the last 168 hours.  BNP: BNP (last 3 results) No results for input(s): BNP in the last 8760 hours.  ProBNP (last 3 results)  Recent Labs  08/21/13 1245 10/05/13 1118 11/06/13 1502  PROBNP 902.6* 309.0* 235.0*     CBG:  Recent Labs Lab 05/03/14 0701  GLUCAP 128*    Coagulation Studies:  Recent Labs  05/01/14 1122  LABPROT 20.5*  INR 1.9*    Other results:   Imaging:  No results found.   Medications:     Current Medications: . aspirin      . sodium chloride  3 mL Intravenous Q12H     Infusions: . [START ON 05/04/2014] sodium chloride 50 mL/hr at 05/03/14 0834      Assessment:   1. Progressive exertional dyspnea and hypxemia 2. CAD s/p CABG 3. PAF  4. DM2  Plan/Discussion:     Patient with progressive dyspnea and hypoxemia of unclear etiology. Typically would be unusual for cardiac disease to cause hypoxemia unless patient with over pulmonary edema. However, CPX test suggests possible PAH. Will proceed with RHC +/- exercise to further evaluate.     Length of Stay:   Glori Bickers MD 05/03/2014, 9:26 AM  Advanced Heart Failure Team Pager [phone] (M-F; 7a - 4p)  Please contact [facility] for night-coverage after hours (4p -7a ) and weekends on [email or web address]

## 2014-05-03 NOTE — H&P (View-Only) (Signed)
Advanced Heart Failure Team Consult Note  Reason for Consultation: Dyspnea  HPI:    74 y/o male with CAD s/p CABG and AF with progressive dyspnea and hypoxemia over past 3 months.  Has had extensive work-up which has been unrevealing. Recent CPX test has suggested possible PAH. He is referred for RHC +/- exercise.   Review of Systems: [y] = yes, [ ]  = no   General: Weight gain [ ] ; Weight loss [ ] ; Anorexia [ ] ; Fatigue [ y]; Fever [ ] ; Chills [ ] ; Weakness [ y]  Cardiac: Chest pain/pressure [ ] ; Resting SOB [ y]; Exertional SOB [ y]; Orthopnea [ ] ; Pedal Edema [ y]; Palpitations [ ] ; Syncope [ ] ; Presyncope [ ] ; Paroxysmal nocturnal dyspnea[ ]   Pulmonary: Cough [ ] ; Wheezing[ ] ; Hemoptysis[ ] ; Sputum [ ] ; Snoring [ ]   GI: Vomiting[ ] ; Dysphagia[ ] ; Melena[ ] ; Hematochezia [ ] ; Heartburn[ ] ; Abdominal pain [ ] ; Constipation [ ] ; Diarrhea [ ] ; BRBPR [ ]   GU: Hematuria[ ] ; Dysuria [ ] ; Nocturia[ ]   Vascular: Pain in legs with walking [ ] ; Pain in feet with lying flat [ ] ; Non-healing sores [ ] ; Stroke [ ] ; TIA [ ] ; Slurred speech [ ] ;  Neuro: Headaches[ ] ; Vertigo[ ] ; Seizures[ ] ; Paresthesias[ ] ;Blurred vision [ ] ; Diplopia [ ] ; Vision changes [ ]   Ortho/Skin: Arthritis Blue.Reese ]; Joint pain Blue.Reese ]; Muscle pain [ ] ; Joint swelling [ ] ; Back Pain [ ] ; Rash [ ]   Psych: Depression[ ] ; Anxiety[ ]   Heme: Bleeding problems [ ] ; Clotting disorders [ ] ; Anemia [ ]   Endocrine: Diabetes [ ] ; Thyroid dysfunction[ ]   Home Medications Prior to Admission medications   Medication Sig Start Date End Date Taking? Authorizing Provider  acetaminophen (TYLENOL) 500 MG tablet Take 1,000 mg by mouth every 6 (six) hours as needed for mild pain.    Yes Historical Provider, MD  ALPRAZolam Duanne Moron) 1 MG tablet Take 0.5 mg by mouth at bedtime as needed for sleep.    Yes Historical Provider, MD  atorvastatin (LIPITOR) 40 MG tablet Take 20 mg by mouth every Monday, Wednesday, and Friday.    Yes Historical Provider, MD  B  Complex Vitamins (VITAMIN B COMPLEX PO) Take 1 tablet by mouth daily.   Yes Historical Provider, MD  Besifloxacin HCl 0.6 % SUSP Place 1 drop into the left eye See admin instructions. Instill 1 drop into the left eye 4 times daily for 2 days after injection done at Dr. Tempie Hoist office (last injection 04/24/14) - Injections are done every 7 weeks   Yes Historical Provider, MD  carvedilol (COREG) 6.25 MG tablet Take 6.25 mg by mouth 2 (two) times daily with a meal.   Yes Historical Provider, MD  cholecalciferol (VITAMIN D) 1000 UNITS tablet Take 5,000 Units by mouth daily.   Yes Historical Provider, MD  cloNIDine (CATAPRES) 0.1 MG tablet Take 0.5 tablets (0.05 mg total) by mouth 2 (two) times daily. 03/29/14  Yes Thayer Headings, MD  diphenhydrAMINE (BENADRYL) 25 MG tablet Take 25 mg by mouth at bedtime as needed for allergies.    Yes Historical Provider, MD  furosemide (LASIX) 40 MG tablet Take 2 tablets (80 mg total) by mouth daily. Patient taking differently: Take 80 mg by mouth See admin instructions. Take 2 tablets (80 mg) every morning, may take 1 additional tablet in the afternoon as needed for ankle swelling 12/18/13  Yes Thayer Headings, MD  glimepiride (AMARYL) 2 MG tablet Take 1  mg by mouth daily with breakfast.    Yes Historical Provider, MD  HYDROcodone-acetaminophen (NORCO/VICODIN) 5-325 MG per tablet Take 1 tablet by mouth every 6 (six) hours as needed for moderate pain. Patient taking differently: Take 1 tablet by mouth at bedtime as needed for moderate pain.  12/14/13  Yes [name], MD  metFORMIN (GLUCOPHAGE-XR) 500 MG 24 hr tablet Take 1,000 mg by mouth 2 (two) times daily before a meal.  07/17/13  Yes Historical Provider, MD  minoxidil (LONITEN) 10 MG tablet Take 20 mg by mouth at bedtime.    Yes Historical Provider, MD  Multiple Vitamin (MULTIVITAMIN WITH MINERALS) TABS tablet Take 1 tablet by mouth daily.   Yes Historical Provider, MD  potassium chloride SA (K-DUR,KLOR-CON)  20 MEQ tablet Take 2 tablets (40 mEq total) by mouth daily. Patient taking differently: Take 20 mEq by mouth 2 (two) times daily.  12/18/13  Yes Thayer Headings, MD  sertraline (ZOLOFT) 100 MG tablet TAKE 1 TABLET BY MOUTH DAILY 12/04/13  Yes [name], MD  valsartan (DIOVAN) 320 MG tablet Take 1 tablet (320 mg total) by mouth daily. 01/03/14  Yes Thayer Headings, MD  warfarin (COUMADIN) 5 MG tablet Take 7.5-10 mg by mouth daily after supper. Take 2 tablets (10 mg) Monday, Wednesday, Friday, take 1 1/2 tablets (7.5 mg) on Sunday, Tuesday, Thursday, Saturday 09/26/13  Yes Vicie Mutters, PA-C  ranitidine (ZANTAC) 150 MG tablet Take 150 mg by mouth daily as needed for heartburn.     Historical Provider, MD    Past Medical History: Past Medical History  Diagnosis Date  . Hx of adenomatous colonic polyps   . Diabetes mellitus type II   . Hypertension   . Diverticulosis 2001  . Hyperlipidemia   . Persistent atrial fibrillation     a. s/p MAZE 04/2013 in setting of CABG. b. Amio stopped in 10/2013 after flutter ablation.  . Obstructive sleep apnea     compliant with CPAP  . Chronic diastolic congestive heart failure   . Hypertensive cardiomyopathy   . History of cardioversion     x3 (years uncertain)  . H/O hiatal hernia   . Basal cell carcinoma   . Adrenal adenoma   . CAD (coronary artery disease)     a. 04/2013 CABG x 2: LIMA to LAD, SVG to RI, EVH via R thigh.  . S/P Maze operation for atrial fibrillation     a. 04/2013: Complete bilateral atrial lesion set using cryothermy and bipolar radiofrequency ablation with clipping of LA appendage (@ time of CABG)  . GERD (gastroesophageal reflux disease)   . Depression   . Anxiety   . DJD (degenerative joint disease)   . 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 DCCV 08/2013. b. s/p RFA 10/2013.  [name] Respiratory failure     a. Hypoxia 10/2013 - required supp O2 as inpatient, did not require it at discharge.  . Pleural effusion, left      a. s/p thoracentesis 05/2013.  [name] PFO (patent foramen ovale)     a. Small PFO by TEE 10/2013.    Past Surgical History: Past Surgical History  Procedure Laterality Date  . Polinydal cyst      Removed  . Great toe arthrodesis, interphalangeal joint      Right foot  . Retina repair-right    . Cataract extraction      bilateral  . Basal cell carcinoma excision      x3 on face  .  Polypectomy    . Cardiac catheterization      myocardial bridge but no cad  . Coronary artery bypass graft N/A 04/05/2013    Procedure: CORONARY ARTERY BYPASS GRAFTING (CABG) TIMES TWO USING LEFT INTERNAL MAMMARY ARTERY AND RIGHT SAPHENOUS LEG VEIN HARVESTED ENDOSCOPICALLY;  Surgeon: Rexene Alberts, MD;  Location: [facility];  Service: Open Heart Surgery;  Laterality: N/A;  . Maze N/A 04/05/2013    Procedure: MAZE;  Surgeon: Rexene Alberts, MD;  Location: [facility];  Service: Open Heart Surgery;  Laterality: N/A;  . Intraoperative transesophageal echocardiogram N/A 04/05/2013    Procedure: INTRAOPERATIVE TRANSESOPHAGEAL ECHOCARDIOGRAM;  Surgeon: Rexene Alberts, MD;  Location: [facility];  Service: Open Heart Surgery;  Laterality: N/A;  . Tee without cardioversion N/A 08/23/2013    Procedure: TRANSESOPHAGEAL ECHOCARDIOGRAM (TEE);  Surgeon: Sanda Klein, MD;  Location: [facility];  Service: Cardiovascular;  Laterality: N/A;  . Cardioversion N/A 08/23/2013    Procedure: CARDIOVERSION;  Surgeon: Sanda Klein, MD;  Location: [facility] ENDOSCOPY;  Service: Cardiovascular;  Laterality: N/A;  . Tee without cardioversion N/A 10/26/2013    Procedure: TRANSESOPHAGEAL ECHOCARDIOGRAM (TEE);  Surgeon: Sueanne Margarita, MD;  Location: [facility] ENDOSCOPY;  Service: Cardiovascular;  Laterality: N/A;  . Left heart catheterization with coronary angiogram N/A 03/07/2013    Procedure: LEFT HEART CATHETERIZATION WITH CORONARY ANGIOGRAM;  Surgeon: Burnell Blanks, MD;  Location: [facility] CATH LAB;  Service: Cardiovascular;  Laterality: N/A;  . Atrial fibrillation  ablation N/A 10/26/2013    Procedure: ATRIAL FIBRILLATION ABLATION;  Surgeon: Coralyn Mark, MD;  Location: [place] CATH LAB;  Service: Cardiovascular;  Laterality: N/A;  . Carpometacarpel suspension plasty Left 02/14/2014    Procedure: CARPOMETACARPEL ([place]) SUSPENSIONPLASTY THUMB  WITH  ABDUCTOR POLLICIS LONGUS TRANSFER AND STENOSING TENOSYNOVITIS RELEASE LEFT WRIST;  Surgeon: Charlotte Crumb, MD;  Location: [place];  Service: Orthopedics;  Laterality: Left;  . Trapezium resection      Family History: Family History  Problem Relation Age of Onset  . Colon cancer Mother     Family History/Uncle   . Colon polyps Mother     Family History  . Atrial fibrillation Mother   . Hypertension Mother   . Colon polyps Sister     Family history  . Diabetes Maternal Uncle   . Stroke Paternal Uncle   . Dementia Father     Social History: History   Social History  . Marital Status: Married    Spouse Name: N/A  . Number of Children: 1  . Years of Education: N/A   Occupational History  . retired Software engineer    Social History Main Topics  . Smoking status: Former Smoker -- 4.00 packs/day for 25 years    Types: Cigarettes    Quit date: 01/05/1981  . Smokeless tobacco: Never Used  . Alcohol Use: Yes     Comment: 1-5 drinks per week  . Drug Use: No  . Sexual Activity: Not on file   Other Topics Concern  . Not on file   Social History Narrative   Daily caffeine-yes   Patient gets regular exercise.   Pt lives in [place] with spouse.  Retired Software engineer.  Allergies:  Allergies  Allergen Reactions  . Sunflower Seed [Sunflower Oil] Swelling and Other (See Comments)    Tongue and lip swelling  . Horse-Derived Products Other (See Comments)    Per allergy skin test  . Tetanus Toxoids Other (See Comments)    Per allergy skin test    Objective:    Vital Signs:   Temp:  [98.1 F (36.7 C)] 98.1 F (36.7 C) (04/28 0658) Pulse Rate:  [71] 71 (04/28 0658) Resp:  [18] 18 (04/28 0658) BP: (130)/(67) 130/67 mmHg (04/28 0658) SpO2:  [93 %] 93 % (04/28 0658) Weight:  [86.24 kg (190 lb 2 oz)] 86.24 kg (190 lb 2 oz) (04/28 0658)    Weight change: Filed Weights   05/03/14 0658  Weight: 86.24 kg (190 lb 2 oz)    Intake/Output:  No intake or output data in the 24 hours ending 05/03/14 0926   Physical Exam: General:  Elderly Well appearing. No resp difficulty HEENT: normal Neck: supple. JVP 8 . Carotids 2+ bilat; no bruits. No lymphadenopathy or thryomegaly appreciated. Cor: PMI nondisplaced. Regular rate & rhythm. No rubs, gallops or murmurs. Lungs: clear Abdomen: soft, nontender, nondistended. No hepatosplenomegaly. No bruits or masses. Good bowel sounds. Extremities: no cyanosis, clubbing, rash, edema Neuro: alert & orientedx3, cranial nerves grossly intact. moves all 4 extremities w/o  difficulty. Affect pleasant  Telemetry: Sinus 70s  Labs: Basic Metabolic Panel:  Recent Labs Lab 05/01/14 1122  NA 139  K 3.9  CL 106  CO2 27  GLUCOSE 313*  BUN 25*  CREATININE 1.11  CALCIUM 9.3    Liver Function Tests: No results for input(s): AST, ALT, ALKPHOS, BILITOT, PROT, ALBUMIN in the last 168 hours. No results for input(s): LIPASE, AMYLASE in the last 168 hours. No results for input(s): AMMONIA in the last 168 hours.  CBC:  Recent Labs Lab 05/01/14 1122  WBC 7.1  NEUTROABS 6.0  HGB 11.0*  HCT 33.1*  MCV 92.1  PLT 257.0    Cardiac Enzymes: No results for input(s): CKTOTAL, CKMB, CKMBINDEX, TROPONINI in the last 168 hours.  BNP: BNP (last 3 results) No results for input(s): BNP in the last 8760 hours.  ProBNP (last 3 results)  Recent Labs  08/21/13 1245 10/05/13 1118 11/06/13 1502  PROBNP 902.6* 309.0* 235.0*     CBG:  Recent Labs Lab 05/03/14 0701  GLUCAP 128*    Coagulation Studies:  Recent Labs  05/01/14 1122  LABPROT 20.5*  INR 1.9*    Other results:   Imaging:  No results found.   Medications:     Current Medications: . aspirin      . sodium chloride  3 mL Intravenous Q12H     Infusions: . [START ON 05/04/2014] sodium chloride 50 mL/hr at 05/03/14 0834      Assessment:   1. Progressive exertional dyspnea and hypxemia 2. CAD s/p CABG 3. PAF  4. DM2  Plan/Discussion:     Patient with progressive dyspnea and hypoxemia of unclear etiology. Typically would be unusual for cardiac disease to cause hypoxemia unless patient with over pulmonary edema. However, CPX test suggests possible PAH. Will proceed with RHC +/- exercise to further evaluate.     Length of Stay:   Glori Bickers MD 05/03/2014, 9:26 AM  Advanced Heart Failure Team Pager [phone] (M-F; 7a - 4p)  Please contact [facility] for night-coverage after hours (4p -7a ) and weekends on [email or web address]

## 2014-05-03 NOTE — CV Procedure (Signed)
Cardiac Cath Procedure Note:  Indication:  Dyspnea  Procedures performed:  1) Right heart catheterization 2) Shunt run 3) SVC venogram  Description of procedure:   The risks and indication of the procedure were explained. Consent was signed and placed on the chart. An appropriate timeout was taken prior to the procedure. The right neck was prepped and draped in the routine sterile fashion and anesthetized with 1% local lidocaine.   A 7 FR venous sheath was placed in the right internal jugular vein using a modified Seldinger technique. A standard Swan-Ganz catheter was used for the procedure.   A complete shunt run was performed as well as a SVC venogram.   Complications: None apparent.  Findings:  RA = 18 RV = 72/4/17 PA = 76/27 (46) PCW = 21 Fick cardiac output/index (using PA sat)  = 9.2/4.45 Thermo CO/CI =  10.0/4.87 PVR = 2.2 WU Fick cardiac output/index (using high SVC sat) = 5.2/2.5 Pulse-ox saturation  = 89%  High SVC sat = 54% Low SVC sat = 81%n (at SVC/RA junction) RA sat = 68% RV sat = 66% PA sat =  68%, 69% IVC sat =56%    Assessment: 1. 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   Glori Bickers MD 10:51 AM

## 2014-05-04 ENCOUNTER — Other Ambulatory Visit: Payer: Self-pay | Admitting: Nurse Practitioner

## 2014-05-04 ENCOUNTER — Telehealth: Payer: Self-pay | Admitting: Cardiovascular Disease

## 2014-05-04 NOTE — Telephone Encounter (Signed)
New message      Talk to [facility] regarding order for oxygen.  She sent you a message thru the computer.  Need to talk before 5 if possible

## 2014-05-07 ENCOUNTER — Telehealth: Payer: Self-pay | Admitting: Cardiovascular Disease

## 2014-05-07 NOTE — Telephone Encounter (Signed)
New problem    Need oxygen saturations. Please call [facility].

## 2014-05-07 NOTE — Telephone Encounter (Signed)
I have routed separate note to Dr. Acie Fredrickson for the appropriate order as directed by Darlina Guys, with [facility]

## 2014-05-08 ENCOUNTER — Ambulatory Visit: Payer: Medicare Other | Admitting: Internal Medicine

## 2014-05-08 ENCOUNTER — Encounter: Payer: Self-pay | Admitting: *Deleted

## 2014-05-08 ENCOUNTER — Encounter: Payer: Self-pay | Admitting: Internal Medicine

## 2014-05-08 ENCOUNTER — Other Ambulatory Visit (HOSPITAL_COMMUNITY): Payer: Self-pay | Admitting: Cardiology

## 2014-05-08 VITALS — BP 108/60 | HR 70 | Temp 98.2°F | Resp 16 | Ht 71.0 in | Wt 198.0 lb

## 2014-05-08 DIAGNOSIS — R0902 Hypoxemia: Secondary | ICD-10-CM

## 2014-05-08 DIAGNOSIS — I270 Primary pulmonary hypertension: Secondary | ICD-10-CM

## 2014-05-08 DIAGNOSIS — Z7901 Long term (current) use of anticoagulants: Secondary | ICD-10-CM | POA: Diagnosis not present

## 2014-05-08 DIAGNOSIS — R0609 Other forms of dyspnea: Secondary | ICD-10-CM | POA: Diagnosis not present

## 2014-05-08 NOTE — Telephone Encounter (Signed)
This encounter was created in error - please disregard.

## 2014-05-08 NOTE — Patient Instructions (Signed)
Oxygen Use at Home Oxygen can be prescribed for home use. The prescription will show the flow rate. This is how much oxygen is to be used per minute. This will be listed in liters per minute (LPM or L/M). A liter is a metric measurement of volume. You will use oxygen therapy as directed. It can be used while exercising, sleeping, or at rest. You may need oxygen continuously. Your health care provider may order a blood oxygen test (arterial blood gas or pulse oximetry test) that will show what your oxygen level is. Your health care provider will use these measurements to learn about your needs and follow your progress. Home oxygen therapy is commonly used on patients with various lung (pulmonary) related conditions. Some of these conditions include:  Asthma.  Lung cancer.  Pneumonia.  Emphysema.  Chronic bronchitis.  Cystic fibrosis.  Other lung diseases.  Pulmonary fibrosis.  Occupational lung disease.  Heart failure.  Chronic obstructive pulmonary disease (COPD). 3 COMMON WAYS OF PROVIDING OXYGEN THERAPY  Gas: The gas form of oxygen is put into variously sized cylinders or tanks. The cylinders or oxygen tanks contain compressed oxygen. The cylinder is equipped with a regulator that controls the flow rate. Because the flow of oxygen out of the cylinder is constant, an oxygen conserving device may be attached to the system to avoid waste. This device releases the gas only when you inhale and cuts it off when you exhale. Oxygen can be provided in a small cylinder that can be carried with you. Large tanks are heavy and are only for stationary use. After use, empty tanks must be exchanged for full tanks.  Liquid: The liquid form of oxygen is put into a container similar to a thermos. When released, the liquid converts to a gas and you breathe it in just like the compressed gas. This storage method takes up less space than the compressed gas cylinder, and you can transfer the liquid to a  small, portable vessel at home. Liquid oxygen is more expensive than the compressed gas, and the vessel vents when not in use. An oxygen conserving device may be built into the vessel to conserve the oxygen. Liquid oxygen is very cold, around 297 below zero.  Oxygen concentrator: This medical device filters oxygen from room air and gives almost 100% oxygen to the patient. Oxygen concentrators are powered by electricity. Benefits of this system are:  It does not need to be resupplied.  It is not as costly as liquid oxygen.  Extra tubing permits the user to move around easier. There are several types of small, portable oxygen systems available which can help you remain active and mobile. You must have a cylinder of oxygen as a backup in the event of a power failure. Advise your electric power company that you are on oxygen therapy in order to get priority service when there is a power failure. OXYGEN DELIVERY DEVICES There are 3 common ways to deliver oxygen to your body.  Nasal cannula. This is a 2-pronged device inserted in the nostrils that is connected to tubing carrying the oxygen. The tubing can rest on the ears or be attached to the frame of eyeglasses.  Mask. People who need a high flow of oxygen generally use a mask.  Transtracheal catheter. Transtracheal oxygen therapy requires the insertion of a small, flexible tube (catheter) in the windpipe (trachea). This catheter is held in place by a necklace. Since transtracheal oxygen bypasses the mouth, nose, and throat, a humidifier is   absolutely required at flow rates of 1 LPM or greater. OXYGEN USE SAFETY TIPS  Never smoke while using oxygen. Oxygen does not burn or explode, but flammable materials will burn faster in the presence of oxygen.  Keep a fire extinguisher close by. Let your fire department know that you have oxygen in your home.  Warn visitors not to smoke near you when you are using oxygen. Put up "no smoking" signs in your  home where you most often use the oxygen.  When you go to a restaurant with your portable oxygen source, ask to be seated in the nonsmoking section.  Stay at least 5 feet away from gas stoves, candles, lighted fireplaces, or other heat sources.  Do not use materials that burn easily (flammable) while using your oxygen.  If you use an oxygen cylinder, make sure it is secured to some fixed object or in a stand. If you use liquid oxygen, make sure the vessel is kept upright to keep the oxygen from pouring out. Liquid oxygen is so cold it can hurt your skin.  If you use an oxygen concentrator, call your electric company so you will be given priority service if your power goes out. Avoid using extension cords, if possible.  Regularly test your smoke detectors at home to make sure they work. If you receive care in your home from a nurse or other health care provider, he or she may also check to make sure your smoke detectors work. GUIDELINES FOR CLEANING YOUR EQUIPMENT  Wash the nasal prongs with a liquid soap. Thoroughly rinse them once or twice a week.  Replace the prongs every 2 to 4 weeks. If you have an infection (cold, pneumonia) change them when you are well.  Your health care provider will give you instructions on how to clean your transtracheal catheter.  The humidifier bottle should be washed with soap and warm water and rinsed thoroughly between each refill. Air-dry the bottle before filling it with sterile or distilled water. The bottle and its top should be disinfected after they are cleaned.  If you use an oxygen concentrator, unplug the unit. Then wipe down the cabinet with a damp cloth and dry it daily. The air filter should be cleaned at least twice a week.  Follow your home medical equipment and service company's directions for cleaning the compressor filter. HOME CARE INSTRUCTIONS   Do not change the flow of oxygen unless directed by your health care provider.  Do not use  alcohol or other sedating drugs unless instructed. They slow your breathing rate.  Do not use materials that burn easily (flammable) while using your oxygen.  Always keep a spare tank of oxygen. Plan ahead for holidays when you may not be able to get a prescription filled.  Use water-based lubricants on your lips or nostrils. Do not use an oil-based product like petroleum jelly.  To prevent your cheeks or the skin behind your ears from becoming irritated, tuck some gauze under the tubing.  If you have persistent redness under your nose, call your health care provider.  When you no longer need oxygen, your doctor will have the oxygen discontinued. Oxygen is not addicting or habit forming.  Use the oxygen as instructed. Too much oxygen can be harmful and too little will not give you the benefit you need.  Shortness of breath is not always from a lack of oxygen. If your oxygen level is not the cause of your shortness of breath, taking oxygen will   not help. SEEK MEDICAL CARE IF:   You have frequent headaches.  You have shortness of breath or a lasting cough.  You have anxiety.  You are confused.  You are drowsy or sleepy all the time.  You develop an illness which aggravates your breathing.  You cannot exercise.  You are restless.  You have blue lips or fingernails.  You have difficult or irregular breathing and it is getting worse.  You have a fever. Document Released: 03/14/2003 Document Revised: 05/08/2013 Document Reviewed: 08/03/2012 [employer] Patient Information 2015 [employer]. This information is not intended to replace advice given to you by your health care provider. Make sure you discuss any questions you have with your health care provider.  

## 2014-05-08 NOTE — Progress Notes (Signed)
Patient ID: ROSHUN BAUCOM, male   DOB: 1940/04/04, 74 y.o.   MRN: [id number]  Assessment and Plan:   1. Exertional dyspnea -likely due to Pulmonary HTN and shunting -Patient to have cardiac MRI/CT and to follow with [facility]   2. Hypoxemia -cards working on getting supplmental O2  3. Long term current use of anticoagulant therapy -INR continues to be subtherapeutic -Patient would like to see cards before meds get adjusted again -see back in 1 month for recheck      HPI 74 y.o.male presents for 1 month follow up of exertional dyspnea and hypoxemia.  HE reports that he has recently had a workup including a right sided cardiac catheterization which found some shunting of the heart from left to right at the SVC RA level, pulmonary HTN.  Patient reports that he is due to have a cardiac MRI/CT and is waiting to hear from that.  He did have his INR when he was in the hospital.  His INR was 1.9 on 05/01/14.  He has been taking his coumadin.  He reports no missed doses.  He reports no falls or hitting his head.  He does not want to do a whole lot of changing of his coumadin as he thinks that they will likely do some surgery.  Patient reports that they have been doing well.  male is taking their medication.  They are not having difficulty with their medications.  They report no adverse reactions.  He reports that Dr. Tempie Hoist is trying to get his insurance to pay for oxygen due to his hypoxia.  He is not sure when his next appointment is.    Past Medical History  Diagnosis Date  . Hx of adenomatous colonic polyps   . Diabetes mellitus type II   . Hypertension   . Diverticulosis 2001  . Hyperlipidemia   . Persistent atrial fibrillation     a. s/p MAZE 04/2013 in setting of CABG. b. Amio stopped in 10/2013 after flutter ablation.  . Obstructive sleep apnea     compliant with CPAP  . Chronic diastolic congestive heart failure   . Hypertensive cardiomyopathy   . History of cardioversion     x3  (years uncertain)  . H/O hiatal hernia   . Basal cell carcinoma   . Adrenal adenoma   . CAD (coronary artery disease)     a. 04/2013 CABG x 2: LIMA to LAD, SVG to RI, EVH via R thigh.  . S/P Maze operation for atrial fibrillation     a. 04/2013: Complete bilateral atrial lesion set using cryothermy and bipolar radiofrequency ablation with clipping of LA appendage (@ time of CABG)  . GERD (gastroesophageal reflux disease)   . Depression   . Anxiety   . DJD (degenerative joint disease)   . 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 DCCV 08/2013. b. s/p RFA 10/2013.  [name] Respiratory failure     a. Hypoxia 10/2013 - required supp O2 as inpatient, did not require it at discharge.  . Pleural effusion, left     a. s/p thoracentesis 05/2013.  [name] PFO (patent foramen ovale)     a. Small PFO by TEE 10/2013.     Allergies  Allergen Reactions  . Sunflower Seed [Sunflower Oil] Swelling and Other (See Comments)    Tongue and lip swelling  . Horse-Derived Products Other (See Comments)    Per allergy skin test  . Tetanus Toxoids Other (See Comments)    Per allergy  skin test      Current Outpatient Prescriptions on File Prior to Visit  Medication Sig Dispense Refill  . acetaminophen (TYLENOL) 500 MG tablet Take 1,000 mg by mouth every 6 (six) hours as needed for mild pain.     [name] ALPRAZolam (XANAX) 1 MG tablet Take 0.5 mg by mouth at bedtime as needed for sleep.     [name] atorvastatin (LIPITOR) 40 MG tablet Take 20 mg by mouth every Monday, Wednesday, and Friday.     . B Complex Vitamins (VITAMIN B COMPLEX PO) Take 1 tablet by mouth daily.    [name] Besifloxacin HCl 0.6 % SUSP Place 1 drop into the left eye See admin instructions. Instill 1 drop into the left eye 4 times daily for 2 days after injection done at Dr. Tempie Hoist office (last injection 04/24/14) - Injections are done every 7 weeks    . carvedilol (COREG) 6.25 MG tablet Take 6.25 mg by mouth 2 (two) times daily with a meal.    . cholecalciferol  (VITAMIN D) 1000 UNITS tablet Take 5,000 Units by mouth daily.    . cloNIDine (CATAPRES) 0.1 MG tablet Take 0.5 tablets (0.05 mg total) by mouth 2 (two) times daily. 30 tablet 6  . diphenhydrAMINE (BENADRYL) 25 MG tablet Take 25 mg by mouth at bedtime as needed for allergies.     . furosemide (LASIX) 40 MG tablet Take 2 tablets (80 mg total) by mouth daily. (Patient taking differently: Take 80 mg by mouth See admin instructions. Take 2 tablets (80 mg) every morning, may take 1 additional tablet in the afternoon as needed for ankle swelling) 60 tablet 6  . glimepiride (AMARYL) 2 MG tablet Take 1 mg by mouth daily with breakfast.     . HYDROcodone-acetaminophen (NORCO/VICODIN) 5-325 MG per tablet Take 1 tablet by mouth every 6 (six) hours as needed for moderate pain. (Patient taking differently: Take 1 tablet by mouth at bedtime as needed for moderate pain. ) 30 tablet   . metFORMIN (GLUCOPHAGE-XR) 500 MG 24 hr tablet Take 1,000 mg by mouth 2 (two) times daily before a meal.     . minoxidil (LONITEN) 10 MG tablet Take 20 mg by mouth at bedtime.     . Multiple Vitamin (MULTIVITAMIN WITH MINERALS) TABS tablet Take 1 tablet by mouth daily.    . potassium chloride SA (K-DUR,KLOR-CON) 20 MEQ tablet Take 2 tablets (40 mEq total) by mouth daily. (Patient taking differently: Take 20 mEq by mouth 2 (two) times daily. ) 30 tablet 6  . ranitidine (ZANTAC) 150 MG tablet Take 150 mg by mouth daily as needed for heartburn.     . sertraline (ZOLOFT) 100 MG tablet TAKE 1 TABLET BY MOUTH DAILY 30 tablet 11  . valsartan (DIOVAN) 320 MG tablet Take 1 tablet (320 mg total) by mouth daily. 90 tablet 3  . warfarin (COUMADIN) 5 MG tablet Take 7.5-10 mg by mouth daily after supper. Take 2 tablets (10 mg) Monday, Wednesday, Friday, take 1 1/2 tablets (7.5 mg) on Sunday, Tuesday, Thursday, Saturday     No current facility-administered medications on file prior to visit.    Review of Systems  Constitutional: Positive for  malaise/fatigue. Negative for fever and chills.  Respiratory: Positive for shortness of breath. Negative for cough and wheezing.   Cardiovascular: Positive for leg swelling (unchanged from prior). Negative for chest pain, orthopnea and PND.  Skin: Negative.   Neurological: Positive for weakness. Negative for dizziness, sensory change and loss of consciousness.  Physical Exam: Filed Weights   05/08/14 1037  Weight: 198 lb (89.812 kg)   BP 108/60 mmHg  Pulse 70  Temp(Src) 98.2 F (36.8 C) (Temporal)  Resp 16  Ht 5\' 11"  (1.803 m)  Wt 198 lb (89.812 kg)  BMI 27.63 kg/m2  SpO2 93% General Appearance: Well developed well nourished, non-toxic appearing in no apparent distress. Eyes: PERRLA, EOMs, conjunctiva w/ no swelling or erythema or discharge Sinuses: No Frontal/maxillary tenderness ENT/Mouth: Ear canals clear without swelling or erythema.  TM's normal bilaterally with no retractions, bulging, or loss of landmarks.   Neck: Supple, thyroid normal, no notable JVD, mild bruising around the neck secondary to recent cath.  Site is clean and without infection Respiratory: Respiratory effort normal, Clear breath sounds anteriorly and posteriorly bilaterally without rales, rhonchi, wheezing or stridor. No retractions or accessory muscle usage. Cardio: RRR with no RGs.  There is a 2/6 murmur heard best at left sternal border. Abdomen: Soft, + BS.  Non tender, no guarding, rebound, hernias, masses.  Musculoskeletal: Full ROM, 5/5 strength, normal gait.  Skin: Warm, dry without rashes  Neuro: Awake and oriented X 3, Cranial nerves intact. Normal muscle tone, no cerebellar symptoms. Sensation intact.  Psych: normal affect, Insight and Judgment appropriate.     FORCUCCI, Avalin Briley, PA-C 11:01 AM [facility]

## 2014-05-09 NOTE — Telephone Encounter (Signed)
Dr. Acie Fredrickson has written oxygen order as addendum on 3/24 office visit

## 2014-05-10 ENCOUNTER — Telehealth: Payer: Self-pay | Admitting: Cardiovascular Disease

## 2014-05-10 ENCOUNTER — Ambulatory Visit (HOSPITAL_COMMUNITY)
Admission: RE | Admit: 2014-05-10 | Discharge: 2014-05-10 | Disposition: A | Discharge status: Home / Self Care | Payer: Medicare Other | Source: Ambulatory Visit | Attending: Internal Medicine | Admitting: Internal Medicine

## 2014-05-10 DIAGNOSIS — I2510 Atherosclerotic heart disease of native coronary artery without angina pectoris: Secondary | ICD-10-CM | POA: Diagnosis not present

## 2014-05-10 DIAGNOSIS — J90 Pleural effusion, not elsewhere classified: Secondary | ICD-10-CM | POA: Insufficient documentation

## 2014-05-10 DIAGNOSIS — Q263 Partial anomalous pulmonary venous connection: Secondary | ICD-10-CM

## 2014-05-10 DIAGNOSIS — I270 Primary pulmonary hypertension: Secondary | ICD-10-CM | POA: Insufficient documentation

## 2014-05-10 DIAGNOSIS — R918 Other nonspecific abnormal finding of lung field: Secondary | ICD-10-CM | POA: Insufficient documentation

## 2014-05-10 DIAGNOSIS — R590 Localized enlarged lymph nodes: Secondary | ICD-10-CM | POA: Insufficient documentation

## 2014-05-10 HISTORY — DX: Partial anomalous pulmonary venous connection: Q26.3

## 2014-05-10 MED ORDER — METOPROLOL TARTRATE 1 MG/ML IV SOLN
5.0000 mg | INTRAVENOUS | Status: DC | PRN
  Administered 2014-05-10: 5 mg via INTRAVENOUS

## 2014-05-10 MED ORDER — NITROGLYCERIN 0.4 MG SL SUBL
0.4000 mg | SUBLINGUAL_TABLET | SUBLINGUAL | Status: DC | PRN
  Administered 2014-05-10: 0.4 mg via SUBLINGUAL

## 2014-05-10 MED ORDER — METOPROLOL TARTRATE 1 MG/ML IV SOLN
INTRAVENOUS | Status: AC
  Filled 2014-05-10: qty 10

## 2014-05-10 MED ORDER — NITROGLYCERIN 0.4 MG SL SUBL
SUBLINGUAL_TABLET | SUBLINGUAL | Status: AC
  Filled 2014-05-10: qty 1

## 2014-05-10 MED ORDER — METOPROLOL TARTRATE 1 MG/ML IV SOLN
5.0000 mg | Freq: Once | INTRAVENOUS | Status: AC
  Administered 2014-05-10: 2.5 mg via INTRAVENOUS

## 2014-05-10 MED ORDER — IOHEXOL 350 MG/ML SOLN
80.0000 mL | Freq: Once | INTRAVENOUS | Status: AC | PRN
Start: 2014-05-10 — End: 2014-05-10
  Administered 2014-05-10: 80 mL via INTRAVENOUS

## 2014-05-10 NOTE — Telephone Encounter (Signed)
New Message   Pt wanted to speak w/ RN- would not specify. Please call bak and discuss.

## 2014-05-10 NOTE — Telephone Encounter (Signed)
Spoke with patient who called to ask about getting home oxygen.  I advised patient that we have been working with [facility] on this issue since last week and that the message I got today states that the PA from patient's PCP office was going to schedule patient for a nurse visit to get documentation necessary for oxygen to be started.  Patient verbalized understanding and I advised him to call back with future questions or concerns.

## 2014-05-11 ENCOUNTER — Encounter: Payer: Self-pay | Admitting: Internal Medicine

## 2014-05-11 ENCOUNTER — Ambulatory Visit: Payer: Medicare Other | Admitting: Internal Medicine

## 2014-05-11 VITALS — BP 124/62 | HR 86 | Temp 98.6°F | Resp 18 | Ht 71.0 in | Wt 204.0 lb

## 2014-05-11 DIAGNOSIS — J9621 Acute and chronic respiratory failure with hypoxia: Secondary | ICD-10-CM

## 2014-05-11 DIAGNOSIS — I2510 Atherosclerotic heart disease of native coronary artery without angina pectoris: Secondary | ICD-10-CM | POA: Diagnosis not present

## 2014-05-11 DIAGNOSIS — I480 Paroxysmal atrial fibrillation: Secondary | ICD-10-CM | POA: Diagnosis not present

## 2014-05-11 DIAGNOSIS — R0609 Other forms of dyspnea: Secondary | ICD-10-CM

## 2014-05-11 NOTE — Patient Instructions (Signed)
Oxygen Use at Home Oxygen can be prescribed for home use. The prescription will show the flow rate. This is how much oxygen is to be used per minute. This will be listed in liters per minute (LPM or L/M). A liter is a metric measurement of volume. You will use oxygen therapy as directed. It can be used while exercising, sleeping, or at rest. You may need oxygen continuously. Your health care provider may order a blood oxygen test (arterial blood gas or pulse oximetry test) that will show what your oxygen level is. Your health care provider will use these measurements to learn about your needs and follow your progress. Home oxygen therapy is commonly used on patients with various lung (pulmonary) related conditions. Some of these conditions include:  Asthma.  Lung cancer.  Pneumonia.  Emphysema.  Chronic bronchitis.  Cystic fibrosis.  Other lung diseases.  Pulmonary fibrosis.  Occupational lung disease.  Heart failure.  Chronic obstructive pulmonary disease (COPD). 3 COMMON WAYS OF PROVIDING OXYGEN THERAPY  Gas: The gas form of oxygen is put into variously sized cylinders or tanks. The cylinders or oxygen tanks contain compressed oxygen. The cylinder is equipped with a regulator that controls the flow rate. Because the flow of oxygen out of the cylinder is constant, an oxygen conserving device may be attached to the system to avoid waste. This device releases the gas only when you inhale and cuts it off when you exhale. Oxygen can be provided in a small cylinder that can be carried with you. Large tanks are heavy and are only for stationary use. After use, empty tanks must be exchanged for full tanks.  Liquid: The liquid form of oxygen is put into a container similar to a thermos. When released, the liquid converts to a gas and you breathe it in just like the compressed gas. This storage method takes up less space than the compressed gas cylinder, and you can transfer the liquid to a  small, portable vessel at home. Liquid oxygen is more expensive than the compressed gas, and the vessel vents when not in use. An oxygen conserving device may be built into the vessel to conserve the oxygen. Liquid oxygen is very cold, around 297 below zero.  Oxygen concentrator: This medical device filters oxygen from room air and gives almost 100% oxygen to the patient. Oxygen concentrators are powered by electricity. Benefits of this system are:  It does not need to be resupplied.  It is not as costly as liquid oxygen.  Extra tubing permits the user to move around easier. There are several types of small, portable oxygen systems available which can help you remain active and mobile. You must have a cylinder of oxygen as a backup in the event of a power failure. Advise your electric power company that you are on oxygen therapy in order to get priority service when there is a power failure. OXYGEN DELIVERY DEVICES There are 3 common ways to deliver oxygen to your body.  Nasal cannula. This is a 2-pronged device inserted in the nostrils that is connected to tubing carrying the oxygen. The tubing can rest on the ears or be attached to the frame of eyeglasses.  Mask. People who need a high flow of oxygen generally use a mask.  Transtracheal catheter. Transtracheal oxygen therapy requires the insertion of a small, flexible tube (catheter) in the windpipe (trachea). This catheter is held in place by a necklace. Since transtracheal oxygen bypasses the mouth, nose, and throat, a humidifier is   absolutely required at flow rates of 1 LPM or greater. OXYGEN USE SAFETY TIPS  Never smoke while using oxygen. Oxygen does not burn or explode, but flammable materials will burn faster in the presence of oxygen.  Keep a fire extinguisher close by. Let your fire department know that you have oxygen in your home.  Warn visitors not to smoke near you when you are using oxygen. Put up "no smoking" signs in your  home where you most often use the oxygen.  When you go to a restaurant with your portable oxygen source, ask to be seated in the nonsmoking section.  Stay at least 5 feet away from gas stoves, candles, lighted fireplaces, or other heat sources.  Do not use materials that burn easily (flammable) while using your oxygen.  If you use an oxygen cylinder, make sure it is secured to some fixed object or in a stand. If you use liquid oxygen, make sure the vessel is kept upright to keep the oxygen from pouring out. Liquid oxygen is so cold it can hurt your skin.  If you use an oxygen concentrator, call your electric company so you will be given priority service if your power goes out. Avoid using extension cords, if possible.  Regularly test your smoke detectors at home to make sure they work. If you receive care in your home from a nurse or other health care provider, he or she may also check to make sure your smoke detectors work. GUIDELINES FOR CLEANING YOUR EQUIPMENT  Wash the nasal prongs with a liquid soap. Thoroughly rinse them once or twice a week.  Replace the prongs every 2 to 4 weeks. If you have an infection (cold, pneumonia) change them when you are well.  Your health care provider will give you instructions on how to clean your transtracheal catheter.  The humidifier bottle should be washed with soap and warm water and rinsed thoroughly between each refill. Air-dry the bottle before filling it with sterile or distilled water. The bottle and its top should be disinfected after they are cleaned.  If you use an oxygen concentrator, unplug the unit. Then wipe down the cabinet with a damp cloth and dry it daily. The air filter should be cleaned at least twice a week.  Follow your home medical equipment and service company's directions for cleaning the compressor filter. HOME CARE INSTRUCTIONS   Do not change the flow of oxygen unless directed by your health care provider.  Do not use  alcohol or other sedating drugs unless instructed. They slow your breathing rate.  Do not use materials that burn easily (flammable) while using your oxygen.  Always keep a spare tank of oxygen. Plan ahead for holidays when you may not be able to get a prescription filled.  Use water-based lubricants on your lips or nostrils. Do not use an oil-based product like petroleum jelly.  To prevent your cheeks or the skin behind your ears from becoming irritated, tuck some gauze under the tubing.  If you have persistent redness under your nose, call your health care provider.  When you no longer need oxygen, your doctor will have the oxygen discontinued. Oxygen is not addicting or habit forming.  Use the oxygen as instructed. Too much oxygen can be harmful and too little will not give you the benefit you need.  Shortness of breath is not always from a lack of oxygen. If your oxygen level is not the cause of your shortness of breath, taking oxygen will   not help. SEEK MEDICAL CARE IF:   You have frequent headaches.  You have shortness of breath or a lasting cough.  You have anxiety.  You are confused.  You are drowsy or sleepy all the time.  You develop an illness which aggravates your breathing.  You cannot exercise.  You are restless.  You have blue lips or fingernails.  You have difficult or irregular breathing and it is getting worse.  You have a fever. Document Released: 03/14/2003 Document Revised: 05/08/2013 Document Reviewed: 08/03/2012 [employer] Patient Information 2015 [employer]. This information is not intended to replace advice given to you by your health care provider. Make sure you discuss any questions you have with your health care provider.  

## 2014-05-11 NOTE — Progress Notes (Signed)
Patient ID: [name], male   DOB: January 09, 1940, 74 y.o.   MRN: [id number]  Subjective:  HPI:  Patient is a 74 y.o. Male with PMH of PAF, chronic CHF, HOCM, aflutter, PFO, HTN, CAD, HL, and is status post CABG who presents to the office at the urging of cardiology for dyspnea on exertion and testing for oxygen.  He reports that his cardiologist called this morning and would like to go ahead with surgery to fix the left to right shunting of his heart.  He feels that his shortness of breath is increasing with activity.  He has a hard time getting around his house secondary to shortness of breath.    Review of Systems  Constitutional: Negative for fever, chills and malaise/fatigue.  Respiratory: Positive for shortness of breath. Negative for cough and wheezing.   Cardiovascular: Positive for orthopnea. Negative for chest pain, palpitations, claudication and leg swelling.  Gastrointestinal: Negative for heartburn, nausea, vomiting, abdominal pain, diarrhea, constipation, blood in stool and melena.  Genitourinary: Negative for dysuria, urgency and frequency.  Neurological: Positive for dizziness (occasionally). Negative for sensory change and loss of consciousness.   Objective:  Filed Vitals:   05/11/14 0938  BP: 124/62  Pulse: 86  Temp: 98.6 F (37 C)  Resp: 18    SATURATION QUALIFICATIONS: (This note is used to comply with regulatory documentation for home oxygen)  Patient Saturations on Room Air at Rest =  92%  Patient Saturations on Room Air while Ambulating = 83%,   Patient Saturations on 4  Liters of oxygen while Ambulating = 92%  Please briefly explain why patient needs home oxygen:  Patient has had some dyspnea on exertion for the past several months which has severely increased in the last 2-3 weeks.  He had a right heart catherterization done on 05/03/14 by 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  They believe that this is the likely cause of O2 decrease.  He does drop to 83% on room air with walking less than 50 ft.  Patient improved to >92% on 4 liters.    Physical Exam  Constitutional: He is oriented to person, place, and time and well-developed, well-nourished, and in no distress. No distress.  HENT:  Head: Normocephalic.  Mouth/Throat: Oropharynx is clear and moist. No oropharyngeal exudate.  Eyes: Conjunctivae and EOM are normal. Pupils are equal, round, and reactive to light. No scleral icterus.  Neck: Normal range of motion. Neck supple. No JVD present. No thyromegaly present.  Cardiovascular: Intact distal pulses.  Exam reveals no gallop and no friction rub.   Murmur heard. Pulmonary/Chest: Breath sounds normal.  Increased respiratory effort, especially with movement.   Musculoskeletal: Normal range of motion.  Lymphadenopathy:    He has no cervical adenopathy.  Neurological: He is alert and oriented to person, place, and time. He has normal motor skills. No cranial nerve deficit. Coordination normal. GCS score is 15.  Skin: Skin is warm and dry. He is not diaphoretic.  Psychiatric: Mood, memory, affect and judgment normal.  Nursing note and vitals reviewed.   Assessment and Plan:  -oxygen order placed by cards, -home health has been informed -have routed note to [facility]

## 2014-05-16 ENCOUNTER — Other Ambulatory Visit (HOSPITAL_COMMUNITY): Payer: Self-pay

## 2014-05-16 ENCOUNTER — Encounter: Payer: Self-pay | Admitting: Thoracic Surgery (Cardiothoracic Vascular Surgery)

## 2014-05-16 ENCOUNTER — Other Ambulatory Visit: Payer: Self-pay | Admitting: *Deleted

## 2014-05-16 ENCOUNTER — Ambulatory Visit
Admission: RE | Admit: 2014-05-16 | Discharge: 2014-05-16 | Disposition: A | Discharge status: Home / Self Care | Payer: Medicare Other | Source: Ambulatory Visit | Attending: Thoracic Surgery (Cardiothoracic Vascular Surgery) | Admitting: Thoracic Surgery (Cardiothoracic Vascular Surgery)

## 2014-05-16 ENCOUNTER — Encounter (HOSPITAL_COMMUNITY): Payer: Medicare Other

## 2014-05-16 ENCOUNTER — Ambulatory Visit (INDEPENDENT_AMBULATORY_CARE_PROVIDER_SITE_OTHER): Payer: Medicare Other | Admitting: Thoracic Surgery (Cardiothoracic Vascular Surgery)

## 2014-05-16 VITALS — BP 144/83 | HR 76 | Resp 16 | Ht 71.0 in | Wt 192.0 lb

## 2014-05-16 DIAGNOSIS — J948 Other specified pleural conditions: Secondary | ICD-10-CM

## 2014-05-16 DIAGNOSIS — R0602 Shortness of breath: Secondary | ICD-10-CM | POA: Diagnosis not present

## 2014-05-16 DIAGNOSIS — I5020 Unspecified systolic (congestive) heart failure: Secondary | ICD-10-CM

## 2014-05-16 DIAGNOSIS — J90 Pleural effusion, not elsewhere classified: Secondary | ICD-10-CM

## 2014-05-16 DIAGNOSIS — Z951 Presence of aortocoronary bypass graft: Secondary | ICD-10-CM

## 2014-05-16 DIAGNOSIS — Z9889 Other specified postprocedural states: Secondary | ICD-10-CM | POA: Diagnosis not present

## 2014-05-16 DIAGNOSIS — Q263 Partial anomalous pulmonary venous connection: Secondary | ICD-10-CM | POA: Diagnosis not present

## 2014-05-16 DIAGNOSIS — Z8679 Personal history of other diseases of the circulatory system: Secondary | ICD-10-CM

## 2014-05-16 NOTE — Patient Instructions (Addendum)
Continue to monitor your weight and adjust dose of lasix as needed per Dr Elmarie Shiley recommendations  Check your blood pressure daily and keep a log  Schedule TEE as soon as practical with Dr [name]

## 2014-05-16 NOTE — Progress Notes (Addendum)
[address]       ,[place] [address]             [phone]     CARDIOTHORACIC SURGERY OFFICE NOTE  Referring Provider is Nahser, Wonda Cheng, MD PCP is Alesia Richards, MD   HPI:  Patient returns to the office today for follow-up of worsening symptoms of exertional shortness of breath consistent with chronic diastolic congestive heart failure now more than one year status post coronary artery bypass grafting 2 and maze procedure. Recent transthoracic echocardiogram demonstrates the presence of severe right ventricular chamber enlargement with at least moderate tricuspid regurgitation and pulmonary hypertension. 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  He has experienced progressive symptoms of exertional shortness of breath with documented baseline hypoxemia and oxygen desaturation with exercise.  Since the patient's last office visit he underwent right heart catheterization by [name] on 05/03/2014.  He was found to have moderate pulmonary hypertension with PA pressures measured 76/27 mmHg.  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 Fick cardiac output measured using pulmonary artery saturation was 9.2 L/m, whereas Fick cardiac output measured using oxygen saturation obtained in the high superior vena cava above the step up measured only 5.2 L/m. Mean central venous pressure was 18 mmHg. Pulmonary vascular resistance was measured 2.2 Woods units.  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 The patient was referred back for surgical consultation.  The patient states that he has had continued gradual progression of symptoms of exertional shortness of breath. He states that his baseline oxygen saturation however between 89 and 91%, and oxygen saturations dropped as low 80s with any exercise. He now has home oxygen therapy and this is helped him symptomatically. He monitors his weight daily and states that it goes up and down somewhat sporadically. He adjusts his Lasix dose occasionally. He has not had any chest pain or chest tightness with activity. He has not had any palpitations to suggest recurrent tachycardia arrhythmias.   Current Outpatient Prescriptions  Medication Sig Dispense Refill  . acetaminophen (TYLENOL) 500 MG tablet Take 1,000 mg by mouth every 6 (six) hours as needed for mild pain.     [name] ALPRAZolam (XANAX) 1 MG tablet Take 0.5 mg by mouth at bedtime as needed for sleep.     [name] atorvastatin (LIPITOR) 40 MG tablet Take 20 mg by mouth every Monday, Wednesday, and Friday.     . B Complex Vitamins (VITAMIN B COMPLEX PO) Take 1 tablet by mouth daily.    [name] Besifloxacin HCl 0.6 % SUSP Place 1 drop into the left eye See admin instructions. Instill 1 drop into the left eye 4 times daily for 2 days after injection done at Dr. Tempie Hoist office (last injection 04/24/14) - Injections are done every 7 weeks    . carvedilol (COREG) 6.25 MG tablet Take 6.25 mg by mouth 2 (two) times daily with a meal.    . cholecalciferol (VITAMIN D) 1000 UNITS tablet Take 5,000 Units by mouth daily.    [name]  cloNIDine (CATAPRES) 0.1 MG tablet Take 0.5 tablets (0.05 mg total) by mouth 2 (two) times daily. 30 tablet 6  . diphenhydrAMINE (BENADRYL) 25 MG tablet Take 25 mg by mouth at bedtime as needed for allergies.     . furosemide (LASIX) 40 MG tablet Take 2 tablets (80 mg total) by mouth daily. (Patient taking differently: Take 80 mg by mouth  See admin instructions. Take 2 tablets (80 mg) every morning, may take 1 additional tablet in the afternoon as needed for ankle swelling) 60 tablet 6  . glimepiride (AMARYL) 2 MG tablet Take 1 mg by mouth daily with breakfast.     . HYDROcodone-acetaminophen (NORCO/VICODIN) 5-325 MG per tablet Take 1 tablet by mouth every 6 (six) hours as needed for moderate pain. (Patient taking differently: Take 1 tablet by mouth at bedtime as needed for moderate pain. ) 30 tablet   . metFORMIN (GLUCOPHAGE-XR) 500 MG 24 hr tablet Take 1,000 mg by mouth 2 (two) times daily before a meal.     . minoxidil (LONITEN) 10 MG tablet Take 20 mg by mouth at bedtime.     . Multiple Vitamin (MULTIVITAMIN WITH MINERALS) TABS tablet Take 1 tablet by mouth daily.    . potassium chloride SA (K-DUR,KLOR-CON) 20 MEQ tablet Take 2 tablets (40 mEq total) by mouth daily. (Patient taking differently: Take 20 mEq by mouth 2 (two) times daily. ) 30 tablet 6  . ranitidine (ZANTAC) 150 MG tablet Take 150 mg by mouth daily as needed for heartburn.     . sertraline (ZOLOFT) 100 MG tablet TAKE 1 TABLET BY MOUTH DAILY 30 tablet 11  . valsartan (DIOVAN) 320 MG tablet Take 1 tablet (320 mg total) by mouth daily. 90 tablet 3  . warfarin (COUMADIN) 5 MG tablet Take 7.5-10 mg by mouth daily after supper. Take 2 tablets (10 mg) Monday, Wednesday, Friday, take 1 1/2 tablets (7.5 mg) on Sunday, Tuesday, Thursday, Saturday     No current facility-administered medications for this visit.      Physical Exam:   BP 144/83 mmHg  Pulse 76  Resp 16  Ht 5\' 11"  (1.803 m)  Wt 192 lb (87.091 kg)  BMI 26.79 kg/m2  SpO2 97%  General:  Well-appearing  Chest:   Few bibasilar inspiratory crackles, breath sounds symmetrical  CV:   Regular rate and rhythm without murmur  Incisions:  n/a  Abdomen:  Soft and nontender  Extremities:  Warm and well-perfused, mild lower extremity edema  Diagnostic Tests:  Cardiac Cath Procedure Note:  Indication:  Dyspnea  Procedures performed:  1) Right heart catheterization 2) Shunt run 3) SVC venogram  Description of procedure:   The risks and indication of the procedure were explained. Consent was signed and placed on the chart. An appropriate timeout was taken prior to the procedure. The right neck was prepped and draped in the routine sterile fashion and anesthetized with 1% local lidocaine.   A 7 FR venous sheath was placed in the right internal jugular vein using a modified Seldinger technique. A standard Swan-Ganz catheter was used for the procedure.   A complete shunt run was performed as well as a SVC venogram.   Complications: None apparent.  Findings:  RA = 18 RV = 72/4/17 PA = 76/27 (46) PCW = 21 Fick cardiac output/index (using PA sat) = 9.2/4.45 Thermo CO/CI = 10.0/4.87 PVR = 2.2 WU Fick cardiac output/index (using high SVC sat) = 5.2/2.5 Pulse-ox saturation = 89%  High SVC sat = 54%  Low SVC sat = 81%n (at SVC/RA junction) RA sat = 68% RV sat = 66% PA sat = 68%, 69% IVC sat =56%    Assessment: 1. 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   Glori Bickers MD 10:51 AM    Cardiac/Coronary CT  COMPARISON: Chest CTA 04/03/2014 and 03/09/2013  TECHNIQUE: The patient was scanned on a Philips 256 scanner.  FINDINGS: A 120 kV prospective scan was triggered in the descending thoracic aorta at 111 HU's. Axial non-contrast 3 mm slices were carried out through the heart. The data set was analyzed on a dedicated work station and scored using the [place]. Gantry rotation speed was 270 msecs and collimation was .9 mm. 5 mg of iv metoprolol and 0.4 mg of sl NTG was given. The 3D data set was reconstructed in 5% intervals of the  67-82 % of the R-R cycle. Diastolic phases were analyzed on a dedicated work station using MPR, MIP and VRT modes. The patient received 80 cc of contrast.  Aorta: Normal caliber, no calcifications, no dissection in the visualized portion of the ascending aorta and proximal portion of the arch.  Aortic Valve: Trileaflet, no calcifications.  There are four pulmonary veins on the right and two pulmonary veins on the left.  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 This anomalous vein drains high into SVC with the distance between the anomalous vein and RA-SVC junction measuring 6 cm.  This anomalous drain provides drainage from the right upper lobe.  There is no associated atrial septal defect.  Coronary Arteries: Normal coronary origin, right dominance.  RCA is dominant vessel with mild calcified plaque in the proximal and distal segment. PDA has no significant plaque.  LM has mild diffuse calcified plaque in its distal portion.  LAD has severe mixed plaque in its proximal and mid portion.  RI has severe mixed plaque.  LCX is a small non-dominant vessel with mild calcified plaque.  LIMA to LAD is patent.  SVG to ramus intermedius is patent.  Other findings:  Moderately dilated right ventricle, severely dilated right atrium.  Dilated pulmonary artery measuring 41.5 x 41.5 mm consistent with pulmonary hypertension.  Atriclip is seen in the left atrial appendage.  IMPRESSION: 1. 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 This anomalous vein drains high into SVC with the long distance between the anomalous vein and RA-SVC junction measuring 6 cm.  This anomalous drain provides drainage from the right upper lung lobe. There is no associated atrial septal defect.  2. Moderately dilated right ventricle, severely dilated right atrium.  Dilated pulmonary artery measuring 41.5 x 41.5 mm consistent with pulmonary hypertension.  3. Severe two vessel CAD (LAD, RI). Patent LIMA to LAD and SVG to RI.  Ena Dawley   Electronically Signed  By: Ena Dawley  On: 05/10/2014 18:17      Study Result     EXAM: OVER-READ INTERPRETATION CT CHEST  The following report is an over-read performed by radiologist Dr. Rebekah Chesterfield [facility] on 05/10/2014. This over-read does not include interpretation of cardiac or coronary anatomy or pathology. The coronary calcium score/coronary CTA interpretation by the cardiologist  is attached.  COMPARISON: Chest CT 04/03/2014.  FINDINGS: Moderate left pleural effusion and small right pleural effusion layering dependently. These are associated with some passive subsegmental atelectasis in the lower lobes of the lungs bilaterally (left greater than right). Areas of ground-glass attenuation and interlobular septal thickening are noted throughout the lungs bilaterally, suggesting a background of mild interstitial pulmonary edema. 4 mm subpleural nodule in the periphery of the lateral segment of the right middle lobe (image 36 of series 204) unchanged compared to prior exam 03/09/2013, likely a subpleural lymph node. No other larger more suspicious appearing pulmonary nodules or masses are noted in the visualized portions of the thorax. Numerous borderline enlarged and mildly enlarged mediastinal lymph nodes are noted, measuring up to 13 mm in short axis in the subcarinal nodal station. Left atrial appendage ligation clip noted. Visualized portions of the upper abdomen are unremarkable. Postoperative changes of median sternotomy for CABG are noted. There are no aggressive appearing lytic or blastic lesions noted in the visualized portions of the skeleton.  IMPRESSION: 1. Small right and moderate left-sided pleural effusions layering dependently.  In addition, there is evidence of mild interstitial pulmonary edema. Together, these findings suggest underlying congestive heart failure. 2. Numerous borderline enlarged and mildly enlarged mediastinal lymph nodes are noted measuring up to 13 mm in the subcarinal nodal station. This appears increased compared to the prior examination from 04/03/2014, and is nonspecific, but may simply be a reflection of underlying congestive heart failure. Attention on follow-up imaging is recommended to ensure resolution of these enlarged lymph nodes. 3. Additional incidental findings, as above, similar prior examinations  Electronically Signed: By: Vinnie Langton M.D. On: 05/10/2014 11:43     CHEST 2 VIEW  COMPARISON: 05/10/2014  FINDINGS: Mild-to-moderate enlargement of the cardiopericardial silhouette. Left atrial appendage clip. Prior CABG.  Cephalization of blood flow. Small right and trace left pleural effusions with continued airspace opacity at the left lung base.  IMPRESSION: 1. Moderate enlargement of the cardiopericardial silhouette with pulmonary venous hypertension. 2. Small right and trace left pleural effusions. 3. Nonspecific airspace opacity at the left lung base may reflect confluent edema or pneumonia.   Electronically Signed  By: Van Clines M.D.  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  Based upon recent right heart catheterization the Qp/Qs measured between 1.5 and 1.7 to 1.  The patient has been living with this shunt physiology his entire life, but there is no question that he has developed significant associated complications including moderate pulmonary hypertension and severe right ventricular chamber  enlargement.   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  Unfortunately, none of these options seem reasonable for this patient.  Both of the first 2 options would probably be  technically extremely complicated because the patient has previously had coronary artery bypass grafting with complete biatrial maze procedure.  The previous maze procedure would make either of the first two options difficult because of the associated scarring at the border of the left atrium and along the superior vena cava.  The very high location of the anomalous vein on the superior vena cava would make an attempt at [facility] baffle very difficult even if he hadn't had previous surgery.  Lung resection could be considered, but recent spirometry performed at the time of the patient's cardiopulmonary exercise testing documented a significant fall in the patient's FEV1 and FVC over the past year in comparison with pulmonary function tests performed in March 2015.  Because of this and the patient's baseline hypoxemia, I would be very concerned that he would do poorly following right upper and middle bilobectomy.  I don't think that simple ligation or embolization of the anomalous vein should be considered because of the potential for severe complications to  the right lung related to inadequate venous drainage.  I am afraid that there may not be much to offer this gentleman from a surgical standpoint, and I don't feel that he would likely be considered candidate for combined heart lung transplantation.  Follow-up chest x-ray performed today demonstrates findings consistent with mild pulmonary edema and small bilateral oral effusions. In my opinion the pleural effusions do not appear to be large enough to suggest that the patient might benefit from repeat thoracentesis.    Plan:  I have discussed the nature of this problem at length with the patient and his wife in the office today. I have additionally discussed the patient's dilemma at length with [name].  As a next step it seems reasonable to proceed with transesophageal echocardiogram to make sure there are no signs of intracardiac shunting to explain the patient's baseline hypoxemia. However, the patient did not have an interatrial communication at the time of his surgery one year ago, and recent cardiac gated CT angiogram did not reveal any signs of intracardiac shunt. Transesophageal echocardiogram may also be helpful to further characterize the presence and functional significance of possible hypertrophic obstructive cardiomyopathy and SAM.  We have not recommended any changes to the patient's current medications at this time. I have reminded the patient that it would be best for him to continue to monitor his weight and blood pressure on a daily basis. We will plan to see him back in 4 weeks after transesophageal echocardiogram has been completed. All of his questions have been addressed.  I spent in excess of 35 minutes during the conduct of this office consultation and >50% of this time involved direct face-to-face encounter with the patient for counseling and/or coordination of their care.    Valentina Gu. Roxy Manns, MD 05/16/2014 5:52 PM

## 2014-05-17 ENCOUNTER — Other Ambulatory Visit: Payer: Self-pay | Admitting: Physician Assistant

## 2014-05-17 ENCOUNTER — Other Ambulatory Visit: Payer: Self-pay

## 2014-05-17 MED ORDER — METFORMIN HCL ER 500 MG PO TB24: 500.0000 mg | ORAL_TABLET | Freq: Two times a day (BID) | ORAL | Status: DC

## 2014-05-24 ENCOUNTER — Ambulatory Visit (INDEPENDENT_AMBULATORY_CARE_PROVIDER_SITE_OTHER): Payer: Medicare Other | Admitting: Cardiovascular Disease

## 2014-05-24 ENCOUNTER — Encounter: Payer: Self-pay | Admitting: Cardiovascular Disease

## 2014-05-24 VITALS — BP 132/68 | HR 88 | Ht 71.0 in | Wt 198.5 lb

## 2014-05-24 DIAGNOSIS — R0602 Shortness of breath: Secondary | ICD-10-CM | POA: Diagnosis not present

## 2014-05-24 DIAGNOSIS — I429 Cardiomyopathy, unspecified: Secondary | ICD-10-CM

## 2014-05-24 DIAGNOSIS — I10 Essential (primary) hypertension: Secondary | ICD-10-CM

## 2014-05-24 DIAGNOSIS — Q263 Partial anomalous pulmonary venous connection: Secondary | ICD-10-CM

## 2014-05-24 DIAGNOSIS — I2510 Atherosclerotic heart disease of native coronary artery without angina pectoris: Secondary | ICD-10-CM

## 2014-05-24 DIAGNOSIS — I119 Hypertensive heart disease without heart failure: Secondary | ICD-10-CM

## 2014-05-24 DIAGNOSIS — I5032 Chronic diastolic (congestive) heart failure: Secondary | ICD-10-CM | POA: Diagnosis not present

## 2014-05-24 DIAGNOSIS — I43 Cardiomyopathy in diseases classified elsewhere: Secondary | ICD-10-CM

## 2014-05-24 DIAGNOSIS — I480 Paroxysmal atrial fibrillation: Secondary | ICD-10-CM

## 2014-05-24 NOTE — Patient Instructions (Signed)
Medication Instructions:  Continue current meds  Labwork: None  Testing/Procedures: Your physician has requested that you have a TEE. During a TEE, sound waves are used to create images of your heart. It provides your doctor with information about the size and shape of your heart and how well your heart's chambers and valves are working. In this test, a transducer is attached to the end of a flexible tube that's guided down your throat and into your esophagus (the tube leading from you mouth to your stomach) to get a more detailed image of your heart. You are not awake for the procedure.   Please arrive at the main entrance at [facility] @ 7:30 on May 31  Follow-Up: Your physician recommends that you schedule a follow-up appointment in: 3 months   Any Other Special Instructions Will Be Listed Below (If Applicable).    Transesophageal Echocardiogram Transesophageal echocardiography (TEE) is a picture test of your heart using sound waves. The pictures taken can give very detailed pictures of your heart. This can help your doctor see if there are problems with your heart. TEE can check:  If your heart has blood clots in it.  How well your heart valves are working.  If you have an infection on the inside of your heart.  Some of the major arteries of your heart.  If your heart valve is working after a Office [employer].  Your heart before a procedure that uses a shock to your heart to get the rhythm back to normal. BEFORE THE PROCEDURE  Do not eat or drink for 6 hours before the procedure or as told by your doctor.  Make plans to have someone drive you home after the procedure. Do not drive yourself home.  An IV tube will be put in your arm. PROCEDURE  You will be given a medicine to help you relax (sedative). It will be given through the IV tube.  A numbing medicine will be sprayed or gargled in the back of your throat to help numb it.  The tip of the probe is placed into the  back of your mouth. You will be asked to swallow. This helps to pass the probe into your esophagus.  Once the tip of the probe is in the right place, your doctor can take pictures of your heart.  You may feel pressure at the back of your throat. AFTER THE PROCEDURE  You will be taken to a recovery area so the sedative can wear off.  Your throat may be sore and scratchy. This will go away slowly over time.  You will go home when you are fully awake and able to swallow liquids.  You should have someone stay with you for the next 24 hours.  Do not drive or operate machinery for the next 24 hours. Document Released: 10/19/2008 Document Revised: 12/27/2012 Document Reviewed: 06/23/2012 [facility] Patient Information 2015 [place], [place]. This information is not intended to replace advice given to you by your health care provider. Make sure you discuss any questions you have with your health care provider.

## 2014-05-24 NOTE — Progress Notes (Signed)
Cardiology Office Note    Date:  05/24/2014   ID:  [name], DOB Jun 12, 1940, MRN [id number]  PCP:  Alesia Richards, MD  Cardiologist:   Thayer Headings, MD   Chief Complaint  Patient presents with  . other    3 month follow up as well as echo and stress test. Meds reviewed by the patient verbally. Needs to discuss having a TEE.    Problem List 1. Hypertension 2. HOCM 3. Aortic insuffuciency 4. Atrial fibrillation-failed Tikosyn, Has seen [facility] in [name], [name] .  5. Diabetes mellitus 6. 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 He has a hyperdynamic LV with systolic anterior motion of the mitral valve leaflet. 8. CABG and MAZE 04-05-13 9. Anomalous pulmonary venous return  History of Present Illness:  [name] is a 74 year old gentleman with a history of hypertension, atrial fibrillation and a myocardial bridge. He is a previous patient of [name]. He also has a history of diabetes. He has felt fairly well. He has not had any episodes of chest pain or shortness of breath.   Nov. 25, 2013: He is very symptomatic with his Atrial fib. He gets very winded with walking. He has been on Eliquis and tikosyn 500 BID. He had an episode of severe dyspnea while at his farm in [place]. He was diagnosed with panic attack. Enzymes were normal. He is ready for his cardioversion.  Nov. 27, 2013: Mr. Voit was seen 2 days ago and was found to have atrial fibrillation. He was scheduled for cardioversion. When he showed up for cardioversion yesterday, he was in normal sinus rhythm.  He related to me that he had some medication changes over the past several weeks. He was previously on Tikosyn and citalopram and his atrial fibrillation seemed to be well-controlled on that. He's retired Software engineer and noted that the citalopram and Tikosyn interacted so his medical doctor changed him to Wellbutrin and stopped the citalopram. It was at that time that he started  having increased palpitations, anxiety, and we noted him to be in atrial fibrillation. He then discontinued the Wellbutrin and was started on Zoloft. He seems to be tolerating this combination. It was at that time that he showed up in the short stay Center and was found to have already converted to sinus rhythm.  May 31, 2012:  [name] is doing well. Still in NSR. No leg edema. He sticks to a low sodium diet. He has occasional CP - typically associated with arm / chest movement.   He wants to stop coumadin - interferes with his arthritis medications. He has maintained NSR since his last visit.   Nov. 24, 2014:  [name] is doing OK. He has some brief palps - he thinks they are PVCs. No sustained episodes of Afib. He has been getting Avastin injections into his eyes.   Jan. 12, 2015:  [name] is seen today as a work in visit for worsening ankle edema. He has not been able to associate his symptoms with change of diet. . he has also been noticing some increasing shortness of breath. He notes short or shortness of breath with any activity-example take the garbage cans out. He denies any PND. He uses a CPAP mask.   Jan. 26, 2015:  [name] is seen back today . He had CABG and MAZE on April 05, 2013 - slow recovery following surgery Had a left pleural tap ~ 4 weeks after surgery - 1.2 liters taken off - did much better  after that  Had elective cardioversioin in early August, 2015.   [name] was seen 2 weeks ago with symptoms consistent with congestive heart failure. He had been in persistent atrial fibrillation and so his tikosyn was stopped. He is still in atrial fibrillation. His rate is well controlled.   He had an echocardiogram: Left ventricle: Wall thickness was increased in a pattern of moderate LVH. Systolic function was vigorous. The estimated ejection fraction was in the range of 75% to 80%. Wall motion was normal; there were no regional wall motion abnormalities. -  Aortic valve: Trivial regurgitation. - Left atrium: The atrium was mildly to moderately dilated. - Right ventricle: The cavity size was mildly dilated. Systolic function was mildly reduced. - Right atrium: The atrium was moderately dilated. - Pulmonary arteries: Systolic pressure was mildly increased. PA peak pressure: 18mm Hg  He was found to have mild pulmonary hypertension.  He continues to have some dyspnea. He has ankle edema. His BP log shows mildly elevated BP.  May 26, 2013: [name] has had 2 V CABG and MAZE (April 05, 2013) since I last saw him. . He has done well He is having some tenderness along the right thigh SVG harvest site. Also c/o left 4th and 5th finger tingling / numbness.   He has otherwise done well. No cardiac complaints.   August 30, 2013:  He was cardioverted several weeks ago. After the cardioversion, his metoprolol was doubled to 25 BID, and diltizem was added   Oct. 2, 2015:  He has gone back into atrial fib. He has known left pleural effusion. He has had more dyspnea. Saw Dr. Melvyn Novas yesterday.  He is more lethargic. Has hypoxemia ( O2 sat of 88% with walking 50 feet)   Dec. 14, 2015:  [name] is followed for atrial fib. He has had a MAZE procedure which was unsuccessful. He had an A-fib ablation which has helped.  No A-fib. Swelling has resolved. Dyspnea has resolved. Doing well from a cardiac standpoint  Needs to have right thumb surgery. Also has bulging discs in his neck with pain radiation down his right arm/ shoulder   Dec. 30, 2015:  [name]'s BP has been elevated. He has had bronchitis and prednisone ( 2 rounds ) for the past several weeks.  Still has not had his thumb surgery.   March 12, 2014:   [name] is a 74 y.o. male who presents for follow-up of his coronary artery disease and atrial fibrillation.  He has had some DOE.  Was diagnosied with bronchitis several months ago.   thinks he had maintained NSR  No  CP , sternotomy is healing up Has had his left thumb surgery    March 29, 2014: He is still very short of breath with any exertion. He has an oxymeter - O2 dropped to 88% after walking back from the trash cans. Was 94% at rest. Has seen pulmonary, no evidence of COPD.   Has had a URI .  Has albuterol which does not seem to help.   05/24/2014:  [name] returns for follow-up visit. He's had a cardiac catheter station which revealed right to left shunting at the level of the SVC. 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 He has seen Dr. Roxy Manns but there are no optimal surgical options. The plan is to proceed with a transesophageal echo to make sure that he doesn't have any intracardiac shunting and to further evaluate the dynamic LV obstruction and the systolic anterior motion  of the mitral valve.   Past Medical History  Diagnosis Date  . Hx of adenomatous colonic polyps   . Diabetes mellitus type II   . Hypertension   . Diverticulosis 2001  . Hyperlipidemia   . Persistent atrial fibrillation     a. s/p MAZE 04/2013 in setting of CABG. b. Amio stopped in 10/2013 after flutter ablation.  . Obstructive sleep apnea     compliant with CPAP  . Chronic diastolic congestive heart failure   . Hypertensive cardiomyopathy   . History of cardioversion     x3 (years uncertain)  . H/O hiatal hernia   . Basal cell carcinoma   . Adrenal adenoma   . CAD (coronary artery disease)     a. 04/2013 CABG x 2: LIMA to LAD, SVG to RI, EVH via R thigh.  . S/P Maze operation for atrial fibrillation     a. 04/2013: Complete bilateral atrial lesion set using cryothermy and bipolar radiofrequency ablation with clipping of LA appendage (@ time of CABG)  . GERD (gastroesophageal reflux disease)   . Depression   . Anxiety   . DJD (degenerative joint disease)   . 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 DCCV 08/2013. b. s/p RFA 10/2013.  [name] Respiratory failure     a. Hypoxia 10/2013 - required supp O2 as  inpatient, did not require it at discharge.  . Pleural effusion, left     a. s/p thoracentesis 05/2013.  [name] PFO (patent foramen ovale)     a. Small PFO by TEE 10/2013.  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 S/P Maze operation for atrial fibrillation 04/05/2013    Complete bilateral atrial lesion set using cryothermy and bipolar radiofrequency ablation with clipping of LA appendage via median sternotomy approach     Past Surgical History  Procedure Laterality Date  . Polinydal cyst      Removed  . Great toe arthrodesis, interphalangeal joint      Right foot  . Retina repair-right    . Cataract extraction      bilateral  . Basal cell carcinoma excision      x3 on face  . Polypectomy    . Cardiac catheterization      myocardial bridge but no cad  . Coronary artery bypass graft N/A 04/05/2013    Procedure: CORONARY ARTERY BYPASS GRAFTING (CABG) TIMES TWO USING LEFT INTERNAL MAMMARY ARTERY AND RIGHT SAPHENOUS LEG VEIN HARVESTED ENDOSCOPICALLY;  Surgeon: Rexene Alberts, MD;  Location: [facility];  Service: Open Heart Surgery;  Laterality: N/A;  . Maze N/A 04/05/2013    Procedure: MAZE;  Surgeon: Rexene Alberts, MD;  Location: [facility];  Service: Open Heart Surgery;  Laterality: N/A;  . Intraoperative transesophageal echocardiogram N/A 04/05/2013    Procedure: INTRAOPERATIVE TRANSESOPHAGEAL ECHOCARDIOGRAM;  Surgeon: Rexene Alberts, MD;  Location: [facility];  Service: Open Heart Surgery;  Laterality: N/A;  . Tee without cardioversion N/A 08/23/2013    Procedure: TRANSESOPHAGEAL ECHOCARDIOGRAM (TEE);  Surgeon: Sanda Klein, MD;  Location: [facility];  Service: Cardiovascular;  Laterality: N/A;  . Cardioversion N/A 08/23/2013    Procedure: CARDIOVERSION;  Surgeon: Sanda Klein, MD;  Location: [facility];  Service: Cardiovascular;  Laterality: N/A;  . Tee without cardioversion N/A 10/26/2013     Procedure: TRANSESOPHAGEAL ECHOCARDIOGRAM (TEE);  Surgeon: Sueanne Margarita, MD;  Location: [facility];  Service: Cardiovascular;  Laterality: N/A;  . Left heart catheterization with coronary angiogram N/A 03/07/2013    Procedure: LEFT HEART CATHETERIZATION WITH CORONARY ANGIOGRAM;  Surgeon: Burnell Blanks, MD;  Location: [facility] CATH LAB;  Service: Cardiovascular;  Laterality: N/A;  . Atrial fibrillation ablation N/A 10/26/2013    Procedure: ATRIAL FIBRILLATION ABLATION;  Surgeon: Coralyn Mark, MD;  Location: [facility] CATH LAB;  Service: Cardiovascular;  Laterality: N/A;  . Carpometacarpel suspension plasty Left 02/14/2014    Procedure: CARPOMETACARPEL ([place]) SUSPENSIONPLASTY THUMB  WITH  ABDUCTOR POLLICIS LONGUS TRANSFER AND STENOSING TENOSYNOVITIS RELEASE LEFT WRIST;  Surgeon: Charlotte Crumb, MD;  Location: [facility];  Service: Orthopedics;  Laterality: Left;  . Trapezium resection    . Right heart catheterization N/A 05/03/2014    Procedure: RIGHT HEART CATH;  Surgeon: Jolaine Artist, MD;  Location: [facility] CATH LAB;  Service: Cardiovascular;  Laterality: N/A;     Current Outpatient Prescriptions  Medication Sig Dispense Refill  . acetaminophen (TYLENOL) 500 MG tablet Take 1,000 mg by mouth every 6 (six) hours as needed for mild pain.     [name] ALPRAZolam (XANAX) 1 MG tablet Take 0.5 mg by mouth at bedtime as needed for sleep.     [name] atorvastatin (LIPITOR) 40 MG tablet Take 20 mg by mouth every Monday, Wednesday, and Friday.     . B Complex Vitamins (VITAMIN B COMPLEX PO) Take 1 tablet by mouth daily.    [name] Besifloxacin HCl 0.6 % SUSP Place 1 drop into the left eye See admin instructions. Instill 1 drop into the left eye 4 times daily for 2 days after injection done at Dr. Tempie Hoist office (last injection 04/24/14) - Injections are done every 7 weeks    . carvedilol (COREG) 6.25 MG tablet Take 6.25 mg by mouth 2 (two) times daily with a meal.    . cholecalciferol (VITAMIN D) 1000  UNITS tablet Take 5,000 Units by mouth daily.    . cloNIDine (CATAPRES) 0.1 MG tablet Take 0.5 tablets (0.05 mg total) by mouth 2 (two) times daily. 30 tablet 6  . diphenhydrAMINE (BENADRYL) 25 MG tablet Take 25 mg by mouth at bedtime as needed for allergies.     . furosemide (LASIX) 40 MG tablet Take 2 tablets (80 mg total) by mouth daily. (Patient taking differently: Take 80 mg by mouth See admin instructions. Take 2 tablets (80 mg) every morning, may take 1 additional tablet in the afternoon as needed for ankle swelling) 60 tablet 6  . glimepiride (AMARYL) 2 MG tablet Take 1 mg by mouth daily with breakfast.     . HYDROcodone-acetaminophen (NORCO/VICODIN) 5-325 MG per tablet Take 1 tablet by mouth every 6 (six) hours as needed for moderate pain. (Patient taking differently: Take 1 tablet by mouth at bedtime as needed for moderate pain. ) 30 tablet   . metFORMIN (GLUCOPHAGE) 500 MG tablet Take 1,000 mg by mouth 2 (two) times daily with a meal.    . minoxidil (LONITEN) 10 MG tablet Take 20 mg by mouth at bedtime.     . Multiple Vitamin (MULTIVITAMIN WITH MINERALS) TABS tablet Take 1 tablet by mouth daily.    . potassium chloride SA (K-DUR,KLOR-CON) 20 MEQ tablet Take 2 tablets (40 mEq total) by mouth daily. (Patient taking differently: Take 20 mEq by mouth 2 (two) times daily. ) 30 tablet 6  . ranitidine (ZANTAC) 150 MG tablet Take 150 mg by mouth daily as needed for heartburn.     . sertraline (ZOLOFT) 100  MG tablet TAKE 1 TABLET BY MOUTH DAILY 30 tablet 11  . valsartan (DIOVAN) 320 MG tablet Take 1 tablet (320 mg total) by mouth daily. 90 tablet 3  . warfarin (COUMADIN) 5 MG tablet Take 7.5-10 mg by mouth daily after supper. Take 2 tablets (10 mg) Monday, Wednesday, Friday, take 1 1/2 tablets (7.5 mg) on Sunday, Tuesday, Thursday, Saturday     No current facility-administered medications for this visit.    Allergies:   Sunflower seed; Horse-derived products; and Tetanus toxoids    Social  History:  The patient  reports that he quit smoking about 33 years ago. His smoking use included Cigarettes. He has a 100 pack-year smoking history. He has never used smokeless tobacco. He reports that he drinks alcohol. He reports that he does not use illicit drugs.   Family History:  The patient's family history includes Atrial fibrillation in his mother; Colon cancer in his mother; Colon polyps in his mother and sister; Dementia in his father; Diabetes in his maternal uncle; Hypertension in his mother; Stroke in his paternal uncle.    ROS:  Please see the history of present illness.    Review of Systems: Constitutional:  denies fever, chills, diaphoresis, appetite change and fatigue.  HEENT: denies photophobia, eye pain, redness, hearing loss, ear pain, congestion, sore throat, rhinorrhea, sneezing, neck pain, neck stiffness and tinnitus.  Respiratory: admits to   DOE,  Extreme shortness of breath with any walking   Cardiovascular: denies chest pain, palpitations and leg swelling.  Gastrointestinal: denies nausea, vomiting, abdominal pain, diarrhea, constipation, blood in stool.  Genitourinary: denies dysuria, urgency, frequency, hematuria, flank pain and difficulty urinating.  Musculoskeletal: denies  myalgias, back pain, joint swelling, arthralgias and gait problem.   Skin: denies pallor, rash and wound.  Neurological: denies dizziness, seizures, syncope, weakness, light-headedness, numbness and headaches.   Hematological: denies adenopathy, easy bruising, personal or family bleeding history.  Psychiatric/ Behavioral: denies suicidal ideation, mood changes, confusion, nervousness, sleep disturbance and agitation.       All other systems are reviewed and negative.    PHYSICAL EXAM: VS:  BP 132/68 mmHg  Pulse 88  Ht 5\' 11"  (1.803 m)  Wt 90.039 kg (198 lb 8 oz)  BMI 27.70 kg/m2 , BMI Body mass index is 27.7 kg/(m^2). GEN: Well nourished, well developed, in no acute distress HEENT:  normal Neck: no JVD, carotid bruits, or masses Cardiac: RRR; 2/6 systolic  Murmur,   Hyperdynamic PMI  rubs, or gallops,no edema  Respiratory:  Decreased breath sounds in left base, right side sounds ok GI: soft, nontender, nondistended, + BS MS: no deformity or atrophy Skin: warm and dry, no rash Neuro:  Strength and sensation are intact Psych: normal   EKG:  EKG is ordered today. ECG reveals probable NSR with 1st degree AV block , HR of 86   Recent Labs: 11/06/2013: Pro B Natriuretic peptide (BNP) 235.0* 01/30/2014: ALT 14; Magnesium 1.8; TSH 1.629 05/01/2014: BUN 25*; Creatinine 1.11; Hemoglobin 11.0*; Platelets 257.0; Potassium 3.9; Sodium 139    Lipid Panel    Component Value Date/Time   CHOL 161 01/30/2014 1214   TRIG 154* 01/30/2014 1214   HDL 42 01/30/2014 1214   CHOLHDL 3.8 01/30/2014 1214   VLDL 31 01/30/2014 1214   LDLCALC 88 01/30/2014 1214      Wt Readings from Last 3 Encounters:  05/24/14 90.039 kg (198 lb 8 oz)  05/16/14 87.091 kg (192 lb)  05/11/14 92.534 kg (204 lb)  Other studies Reviewed: Additional studies/ records that were reviewed today include: . Review of the above records demonstrates:    ASSESSMENT AND PLAN:  1. Hypertension- blood pressures fairly well-controlled. He's slightly hyperdynamic.    2. HOCM - he has a systolic murmur consistent with hypertrophic obstructive cardiomyopathy  3. Aortic insuffuciency 4. Atrial fibrillation-failed Tikosyn, Has seen [facility] in [place], [name] .  So far he seems to be maintaining normal sinus rhythm. 5. Diabetes mellitus 6. 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 He has a hyperdynamic LV with systolic anterior motion of the mitral valve leaflet. 8. CABG and MAZE 04-05-13 - no recent angina. He is healing quite nicely. 9. Anomalous pulmonary vein - he's been evaluated by Dr. Roxy Manns. The options are somewhat limited. Dr. Roxy Manns has requested a transesophageal echo for further evaluation of  the heart and to make sure that there is not an intracardiac shunt. We'll schedule a transesophageal echo on May 31. -   Will see him in 3 months .  Current medicines are reviewed at length with the patient today.  The patient does not have concerns regarding medicines.  The following changes have been made:  See above.   Disposition:   FU with me in 3 months      Signed, Nahser, Wonda Cheng, MD  05/24/2014 10:07 AM    [facility] [place] [place], [place], [place]  [address] Phone: [phone]; Fax: [phone]   Addendum:  Pt had a cardiopulmonary stress test.  Patient's O2 sat 88% on room air  at rest   He will need home O2.   Suggest starting 2 LPM and may titrate as needed to keep O2  sats >  90%.      Nahser, Wonda Cheng, MD  05/24/2014 10:07 AM    [facility] [place],  [place], [place]  [address] Pager [phone] Phone: [phone]; Fax: [phone]   [facility]  [address] [place], [place]  [address] [phone]    Fax [phone]

## 2014-06-01 ENCOUNTER — Encounter (HOSPITAL_COMMUNITY): Payer: Self-pay | Admitting: Pharmacy Technician

## 2014-06-05 ENCOUNTER — Encounter (HOSPITAL_COMMUNITY)
Admission: RE | Disposition: A | Discharge status: Home / Self Care | Payer: Self-pay | Source: Ambulatory Visit | Attending: Cardiovascular Disease

## 2014-06-05 ENCOUNTER — Ambulatory Visit (HOSPITAL_COMMUNITY): Payer: Medicare Other

## 2014-06-05 ENCOUNTER — Encounter (HOSPITAL_COMMUNITY): Payer: Self-pay

## 2014-06-05 ENCOUNTER — Ambulatory Visit (HOSPITAL_COMMUNITY)
Admission: RE | Admit: 2014-06-05 | Discharge: 2014-06-05 | Disposition: A | Discharge status: Home / Self Care | Payer: Medicare Other | Source: Ambulatory Visit | Attending: Cardiovascular Disease | Admitting: Cardiovascular Disease

## 2014-06-05 DIAGNOSIS — I119 Hypertensive heart disease without heart failure: Secondary | ICD-10-CM | POA: Diagnosis not present

## 2014-06-05 DIAGNOSIS — Z91048 Other nonmedicinal substance allergy status: Secondary | ICD-10-CM | POA: Insufficient documentation

## 2014-06-05 DIAGNOSIS — E785 Hyperlipidemia, unspecified: Secondary | ICD-10-CM | POA: Diagnosis not present

## 2014-06-05 DIAGNOSIS — Z951 Presence of aortocoronary bypass graft: Secondary | ICD-10-CM | POA: Diagnosis not present

## 2014-06-05 DIAGNOSIS — I484 Atypical atrial flutter: Secondary | ICD-10-CM | POA: Insufficient documentation

## 2014-06-05 DIAGNOSIS — F329 Major depressive disorder, single episode, unspecified: Secondary | ICD-10-CM | POA: Diagnosis not present

## 2014-06-05 DIAGNOSIS — I5033 Acute on chronic diastolic (congestive) heart failure: Secondary | ICD-10-CM

## 2014-06-05 DIAGNOSIS — Z85828 Personal history of other malignant neoplasm of skin: Secondary | ICD-10-CM | POA: Insufficient documentation

## 2014-06-05 DIAGNOSIS — K219 Gastro-esophageal reflux disease without esophagitis: Secondary | ICD-10-CM | POA: Insufficient documentation

## 2014-06-05 DIAGNOSIS — E119 Type 2 diabetes mellitus without complications: Secondary | ICD-10-CM | POA: Insufficient documentation

## 2014-06-05 DIAGNOSIS — I481 Persistent atrial fibrillation: Secondary | ICD-10-CM | POA: Diagnosis not present

## 2014-06-05 DIAGNOSIS — Z7901 Long term (current) use of anticoagulants: Secondary | ICD-10-CM | POA: Insufficient documentation

## 2014-06-05 DIAGNOSIS — F419 Anxiety disorder, unspecified: Secondary | ICD-10-CM | POA: Diagnosis not present

## 2014-06-05 DIAGNOSIS — I272 Other secondary pulmonary hypertension: Secondary | ICD-10-CM | POA: Insufficient documentation

## 2014-06-05 DIAGNOSIS — Z887 Allergy status to serum and vaccine status: Secondary | ICD-10-CM | POA: Insufficient documentation

## 2014-06-05 DIAGNOSIS — Z981 Arthrodesis status: Secondary | ICD-10-CM | POA: Insufficient documentation

## 2014-06-05 DIAGNOSIS — I340 Nonrheumatic mitral (valve) insufficiency: Secondary | ICD-10-CM | POA: Diagnosis not present

## 2014-06-05 DIAGNOSIS — Z87891 Personal history of nicotine dependence: Secondary | ICD-10-CM | POA: Diagnosis not present

## 2014-06-05 DIAGNOSIS — Z79899 Other long term (current) drug therapy: Secondary | ICD-10-CM | POA: Insufficient documentation

## 2014-06-05 DIAGNOSIS — G4733 Obstructive sleep apnea (adult) (pediatric): Secondary | ICD-10-CM | POA: Diagnosis not present

## 2014-06-05 DIAGNOSIS — I5032 Chronic diastolic (congestive) heart failure: Secondary | ICD-10-CM | POA: Diagnosis not present

## 2014-06-05 DIAGNOSIS — Q211 Atrial septal defect: Secondary | ICD-10-CM | POA: Diagnosis not present

## 2014-06-05 DIAGNOSIS — M1990 Unspecified osteoarthritis, unspecified site: Secondary | ICD-10-CM | POA: Insufficient documentation

## 2014-06-05 DIAGNOSIS — I351 Nonrheumatic aortic (valve) insufficiency: Secondary | ICD-10-CM | POA: Insufficient documentation

## 2014-06-05 HISTORY — PX: TEE WITHOUT CARDIOVERSION: SHX5443

## 2014-06-05 LAB — GLUCOSE, CAPILLARY: GLUCOSE-CAPILLARY: 126 mg/dL — AB (ref 65–99)

## 2014-06-05 SURGERY — ECHOCARDIOGRAM, TRANSESOPHAGEAL
Anesthesia: Moderate Sedation

## 2014-06-05 MED ORDER — MIDAZOLAM HCL 5 MG/ML IJ SOLN
INTRAMUSCULAR | Status: AC
  Filled 2014-06-05: qty 2

## 2014-06-05 MED ORDER — BUTAMBEN-TETRACAINE-BENZOCAINE 2-2-14 % EX AERO
INHALATION_SPRAY | CUTANEOUS | Status: DC | PRN
  Administered 2014-06-05: 2 via TOPICAL

## 2014-06-05 MED ORDER — FENTANYL CITRATE (PF) 100 MCG/2ML IJ SOLN
INTRAMUSCULAR | Status: DC | PRN
  Administered 2014-06-05: 50 ug via INTRAVENOUS

## 2014-06-05 MED ORDER — FENTANYL CITRATE (PF) 100 MCG/2ML IJ SOLN
INTRAMUSCULAR | Status: AC
  Filled 2014-06-05: qty 2

## 2014-06-05 MED ORDER — MIDAZOLAM HCL 10 MG/2ML IJ SOLN
INTRAMUSCULAR | Status: DC | PRN
  Administered 2014-06-05 (×2): 2 mg via INTRAVENOUS

## 2014-06-05 MED ORDER — DIPHENHYDRAMINE HCL 50 MG/ML IJ SOLN
INTRAMUSCULAR | Status: AC
  Filled 2014-06-05: qty 1

## 2014-06-05 MED ORDER — SODIUM CHLORIDE 0.9 % IV SOLN: INTRAVENOUS | Status: DC

## 2014-06-05 NOTE — CV Procedure (Signed)
    Transesophageal Echocardiogram Note  KIEREN GORDIN [id number] 05-01-1940  Procedure: Transesophageal Echocardiogram Indications: PFO, anomalous pulmonary venous return   Procedure Details Consent: Obtained Time Out: Verified patient identification, verified procedure, site/side was marked, verified correct patient position, special equipment/implants available, Radiology Safety Procedures followed,  medications/allergies/relevent history reviewed, required imaging and test results available.  Performed  Medications: Fentanyl: 50 mcg iv  Versed:  4 mg iv   Left Ventrical:  Marked LVH,  Normal LV systolic function   Mitral Valve: normal structure,  Mild- moderate MR   Aortic Valve: normal valve, trivial central AI  Tricuspid Valve: normal   Pulmonic Valve: normal, trivial PI  Left Atrium/ Left atrial appendage: no thrombi   Atrial septum: there is a PFO with evidence of left to right shunting by color and right to left shunting by bubble contrast.   Aorta: normal    Complications: No apparent complications Patient did tolerate procedure well.   Thayer Headings, Brooke Bonito., MD, [facility] 06/05/2014, 9:26 AM

## 2014-06-05 NOTE — Discharge Instructions (Signed)
Transesophageal Echocardiogram °Transesophageal echocardiography (TEE) is a special type of test that produces images of the heart by using sound waves (echocardiogram). This type of echocardiography can obtain better images of the heart than standard echocardiography. TEE is done by passing a flexible tube down the esophagus. The heart is located in front of the esophagus. Because the heart and esophagus are close to one another, your health care provider can take very clear, detailed pictures of the heart via ultrasound waves. °TEE may be done: °· If your health care provider needs more information based on standard echocardiography findings. °· If you had a stroke. This might have happened because a clot formed in your heart. TEE can visualize different areas of the heart and check for clots. °· To check valve anatomy and function. °· To check for infection on the inside of your heart (endocarditis). °· To evaluate the dividing wall (septum) of the heart and presence of a hole that did not close after birth (patent foramen ovale or atrial septal defect). °· To help diagnose a tear in the wall of the aorta (aortic dissection). °· During cardiac valve surgery. This allows the surgeon to assess the valve repair before closing the chest. °· During a variety of other cardiac procedures to guide positioning of catheters. °· Sometimes before a cardioversion, which is a shock to convert heart rhythm back to normal. °LET YOUR HEALTH CARE PROVIDER KNOW ABOUT:  °· Any allergies you have. °· All medicines you are taking, including vitamins, herbs, eye drops, creams, and over-the-counter medicines. °· Previous problems you or members of your family have had with the use of anesthetics. °· Any blood disorders you have. °· Previous surgeries you have had. °· Medical conditions you have. °· Swallowing difficulties. °· An esophageal obstruction. °RISKS AND COMPLICATIONS  °Generally, TEE is a safe procedure. However, as with any  procedure, complications can occur. Possible complications include an esophageal tear (rupture). °BEFORE THE PROCEDURE  °· Do not eat or drink for 6 hours before the procedure or as directed by your health care provider. °· Arrange for someone to drive you home after the procedure. Do not drive yourself home. During the procedure, you will be given medicines that can continue to make you feel drowsy and can impair your reflexes. °· An IV access tube will be started in the arm. °PROCEDURE  °· A medicine to help you relax (sedative) will be given through the IV access tube. °· A medicine may be sprayed or gargled to numb the back of the throat. °· Your blood pressure, heart rate, and breathing (vital signs) will be monitored during the procedure. °· The TEE probe is a long, flexible tube. The tip of the probe is placed into the back of the mouth, and you will be asked to swallow. This helps to pass the tip of the probe into the esophagus. Once the tip of the probe is in the correct area, your health care provider can take pictures of the heart. °· TEE is usually not a painful procedure. You may feel the probe press against the back of the throat. The probe does not enter the trachea and does not affect your breathing. °AFTER THE PROCEDURE  °· You will be in bed, resting, until you have fully returned to consciousness. °· When you first awaken, your throat may feel slightly sore and will probably still feel numb. This will improve slowly over time. °· You will not be allowed to eat or drink until it   is clear that the numbness has improved. °· Once you have been able to drink, urinate, and sit on the edge of the bed without feeling sick to your stomach (nausea) or dizzy, you may be cleared to go home. °· You should have a friend or family member with you for the next 24 hours after your procedure. °Document Released: 03/14/2002 Document Revised: 12/27/2012 Document Reviewed: 06/23/2012 °[employer]® Patient Information  ©2015 [employer]. This information is not intended to replace advice given to you by your health care provider. Make sure you discuss any questions you have with your health care provider. ° °

## 2014-06-05 NOTE — H&P (View-Only) (Signed)
Cardiology Office Note    Date:  05/24/2014   ID:  [name], DOB [birth year], MRN [id number]  PCP:  Cory Richards, MD  Cardiologist:   Cory Headings, MD   Chief Complaint  Patient presents with  . other    3 month follow up as well as echo and stress test. Meds reviewed by the patient verbally. Needs to discuss having a TEE.    Problem List 1. Hypertension 2. HOCM 3. Aortic insuffuciency 4. Atrial fibrillation-failed Tikosyn, Has seen [name] in [place], [place] .  5. Diabetes mellitus 6. 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 He has a hyperdynamic LV with systolic anterior motion of the mitral valve leaflet. 8. CABG and MAZE 04-05-13 9. Anomalous pulmonary venous return  History of Present Illness:  [name] is a 74 year old gentleman with a history of hypertension, atrial fibrillation and a myocardial bridge. He is a previous patient of [name]. He also has a history of diabetes. He has felt fairly well. He has not had any episodes of chest pain or shortness of breath.   Nov. 25, 2013: He is very symptomatic with his Atrial fib. He gets very winded with walking. He has been on Eliquis and tikosyn 500 BID. 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 He was diagnosed with panic attack. Enzymes were normal. He is ready for his cardioversion.  Nov. 27, 2013: [name] was seen 2 days ago and was found to have atrial fibrillation. He was scheduled for cardioversion. When he showed up for cardioversion yesterday, he was in normal sinus rhythm.  He related to me that he had some medication changes over the past several weeks. He was previously on Tikosyn and citalopram and his atrial fibrillation seemed to be well-controlled on that. He's retired Software engineer and noted that the citalopram and Tikosyn interacted so his medical doctor changed him to Wellbutrin and stopped the citalopram. It was at that time that he started  having increased palpitations, anxiety, and we noted him to be in atrial fibrillation. He then discontinued the Wellbutrin and was started on Zoloft. He seems to be tolerating this combination. It was at that time that he showed up in the short stay Center and was found to have already converted to sinus rhythm.  May 31, 2012:  [name] is doing well. Still in NSR. No leg edema. He sticks to a low sodium diet. He has occasional CP - typically associated with arm / chest movement.   He wants to stop coumadin - interferes with his arthritis medications. He has maintained NSR since his last visit.   Nov. 24, 2014:  [name] is doing OK. He has some brief palps - he thinks they are PVCs. No sustained episodes of Afib. He has been getting [name] injections into his eyes.   Jan. 12, 2015:  [name] is seen today as a work in visit for worsening ankle edema. He has not been able to associate his symptoms with change of diet. . he has also been noticing some increasing shortness of breath. He notes short or shortness of breath with any activity-example take the garbage cans out. He denies any PND. He uses a CPAP mask.   Jan. 26, 2015:  [name] is seen back today . He had CABG and MAZE on April 05, 2013 - slow recovery following surgery Had a left pleural tap ~ 4 weeks after surgery - 1.2 liters taken off - did much better  after that  Had elective cardioversioin in early August, 2015.   [name] was seen 2 weeks ago with symptoms consistent with congestive heart failure. He had been in persistent atrial fibrillation and so his tikosyn was stopped. He is still in atrial fibrillation. His rate is well controlled.   He had an echocardiogram: Left ventricle: Wall thickness was increased in a pattern of moderate LVH. Systolic function was vigorous. The estimated ejection fraction was in the range of 75% to 80%. Wall motion was normal; there were no regional wall motion abnormalities. -  Aortic valve: Trivial regurgitation. - Left atrium: The atrium was mildly to moderately dilated. - Right ventricle: The cavity size was mildly dilated. Systolic function was mildly reduced. - Right atrium: The atrium was moderately dilated. - Pulmonary arteries: Systolic pressure was mildly increased. PA peak pressure: 61mm Hg  He was found to have mild pulmonary hypertension.  He continues to have some dyspnea. He has ankle edema. His BP log shows mildly elevated BP.  May 26, 2013: [name] has had 2 V CABG and MAZE (April 05, 2013) since I last saw him. . He has done well He is having some tenderness along the right thigh SVG harvest site. Also c/o left 4th and 5th finger tingling / numbness.   He has otherwise done well. No cardiac complaints.   August 30, 2013:  He was cardioverted several weeks ago. After the cardioversion, his metoprolol was doubled to 25 BID, and diltizem was added   Oct. 2, 2015:  He has gone back into atrial fib. He has known left pleural effusion. He has had more dyspnea. Saw Dr. Melvyn [name] yesterday.  He is more lethargic. Has hypoxemia ( O2 sat of 88% with walking 50 feet)   Dec. 14, 2015:  [name] is followed for atrial fib. He has had a MAZE procedure which was unsuccessful. He had an A-fib ablation which has helped.  No A-fib. Swelling has resolved. Dyspnea has resolved. Doing well from a cardiac standpoint  Needs to have right thumb surgery. Also has bulging discs in his neck with pain radiation down his right arm/ shoulder   Dec. 30, 2015:  [name]'s BP has been elevated. He has had bronchitis and prednisone ( 2 rounds ) for the past several weeks.  Still has not had his thumb surgery.   March 12, 2014:   [name] is a 74 y.o. male who presents for follow-up of his coronary artery disease and atrial fibrillation.  He has had some DOE.  Was diagnosied with bronchitis several months ago.   thinks he had maintained NSR  No  CP , sternotomy is healing up Has had his left thumb surgery    March 29, 2014: He is still very short of breath with any exertion. He has an oxymeter - O2 dropped to 88% after walking back from the trash cans. Was 94% at rest. Has seen pulmonary, no evidence of COPD.   Has had a URI .  Has albuterol which does not seem to help.   05/24/2014:  [name] returns for follow-up visit. He's had a cardiac catheter station which revealed right to left shunting at the level of the SVC. 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 He has seen Dr. Roxy Manns but there are no optimal surgical options. The plan is to proceed with a transesophageal echo to make sure that he doesn't have any intracardiac shunting and to further evaluate the dynamic LV obstruction and the systolic anterior motion  of the mitral valve.   Past Medical History  Diagnosis Date  . Hx of adenomatous colonic polyps   . Diabetes mellitus type II   . Hypertension   . Diverticulosis 2001  . Hyperlipidemia   . Persistent atrial fibrillation     a. s/p MAZE 04/2013 in setting of CABG. b. Amio stopped in 10/2013 after flutter ablation.  . Obstructive sleep apnea     compliant with CPAP  . Chronic diastolic congestive heart failure   . Hypertensive cardiomyopathy   . History of cardioversion     x3 (years uncertain)  . H/O hiatal hernia   . Basal cell carcinoma   . Adrenal adenoma   . CAD (coronary artery disease)     a. 04/2013 CABG x 2: LIMA to LAD, SVG to RI, EVH via R thigh.  . S/P Maze operation for atrial fibrillation     a. 04/2013: Complete bilateral atrial lesion set using cryothermy and bipolar radiofrequency ablation with clipping of LA appendage (@ time of CABG)  . GERD (gastroesophageal reflux disease)   . Depression   . Anxiety   . DJD (degenerative joint disease)   . 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 DCCV 08/2013. b. s/p RFA 10/2013.  [name] Respiratory failure     a. Hypoxia 10/2013 - required supp O2 as  inpatient, did not require it at discharge.  . Pleural effusion, left     a. s/p thoracentesis 05/2013.  [name] PFO (patent foramen ovale)     a. Small PFO by TEE 10/2013.  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 S/P Maze operation for atrial fibrillation 04/05/2013    Complete bilateral atrial lesion set using cryothermy and bipolar radiofrequency ablation with clipping of LA appendage via median sternotomy approach     Past Surgical History  Procedure Laterality Date  . Polinydal cyst      Removed  . Great toe arthrodesis, interphalangeal joint      Right foot  . Retina repair-right    . Cataract extraction      bilateral  . Basal cell carcinoma excision      x3 on face  . Polypectomy    . Cardiac catheterization      myocardial bridge but no cad  . Coronary artery bypass graft N/A 04/05/2013    Procedure: CORONARY ARTERY BYPASS GRAFTING (CABG) TIMES TWO USING LEFT INTERNAL MAMMARY ARTERY AND RIGHT SAPHENOUS LEG VEIN HARVESTED ENDOSCOPICALLY;  Surgeon: Rexene Alberts, MD;  Location: [facility];  Service: Open Heart Surgery;  Laterality: N/A;  . Maze N/A 04/05/2013    Procedure: MAZE;  Surgeon: Rexene Alberts, MD;  Location: [facility];  Service: Open Heart Surgery;  Laterality: N/A;  . Intraoperative transesophageal echocardiogram N/A 04/05/2013    Procedure: INTRAOPERATIVE TRANSESOPHAGEAL ECHOCARDIOGRAM;  Surgeon: Rexene Alberts, MD;  Location: [facility];  Service: Open Heart Surgery;  Laterality: N/A;  . Tee without cardioversion N/A 08/23/2013    Procedure: TRANSESOPHAGEAL ECHOCARDIOGRAM (TEE);  Surgeon: Sanda Klein, MD;  Location: [facility];  Service: Cardiovascular;  Laterality: N/A;  . Cardioversion N/A 08/23/2013    Procedure: CARDIOVERSION;  Surgeon: Sanda Klein, MD;  Location: [facility];  Service: Cardiovascular;  Laterality: N/A;  . Tee without cardioversion N/A 10/26/2013     Procedure: TRANSESOPHAGEAL ECHOCARDIOGRAM (TEE);  Surgeon: Sueanne Margarita, MD;  Location: [facility];  Service: Cardiovascular;  Laterality: N/A;  . Left heart catheterization with coronary angiogram N/A 03/07/2013    Procedure: LEFT HEART CATHETERIZATION WITH CORONARY ANGIOGRAM;  Surgeon: Burnell Blanks, MD;  Location: [facility] CATH LAB;  Service: Cardiovascular;  Laterality: N/A;  . Atrial fibrillation ablation N/A 10/26/2013    Procedure: ATRIAL FIBRILLATION ABLATION;  Surgeon: Coralyn Mark, MD;  Location: [facility] CATH LAB;  Service: Cardiovascular;  Laterality: N/A;  . Carpometacarpel suspension plasty Left 02/14/2014    Procedure: CARPOMETACARPEL ([place]) SUSPENSIONPLASTY THUMB  WITH  ABDUCTOR POLLICIS LONGUS TRANSFER AND STENOSING TENOSYNOVITIS RELEASE LEFT WRIST;  Surgeon: Charlotte Crumb, MD;  Location: [facility];  Service: Orthopedics;  Laterality: Left;  . Trapezium resection    . Right heart catheterization N/A 05/03/2014    Procedure: RIGHT HEART CATH;  Surgeon: Jolaine Artist, MD;  Location: [facility] CATH LAB;  Service: Cardiovascular;  Laterality: N/A;     Current Outpatient Prescriptions  Medication Sig Dispense Refill  . acetaminophen (TYLENOL) 500 MG tablet Take 1,000 mg by mouth every 6 (six) hours as needed for mild pain.     [name] ALPRAZolam (XANAX) 1 MG tablet Take 0.5 mg by mouth at bedtime as needed for sleep.     [name] atorvastatin (LIPITOR) 40 MG tablet Take 20 mg by mouth every Monday, Wednesday, and Friday.     . B Complex Vitamins (VITAMIN B COMPLEX PO) Take 1 tablet by mouth daily.    [name] Besifloxacin HCl 0.6 % SUSP Place 1 drop into the left eye See admin instructions. Instill 1 drop into the left eye 4 times daily for 2 days after injection done at Dr. Tempie Hoist office (last injection 04/24/14) - Injections are done every 7 weeks    . carvedilol (COREG) 6.25 MG tablet Take 6.25 mg by mouth 2 (two) times daily with a meal.    . cholecalciferol (VITAMIN D) 1000  UNITS tablet Take 5,000 Units by mouth daily.    . cloNIDine (CATAPRES) 0.1 MG tablet Take 0.5 tablets (0.05 mg total) by mouth 2 (two) times daily. 30 tablet 6  . diphenhydrAMINE (BENADRYL) 25 MG tablet Take 25 mg by mouth at bedtime as needed for allergies.     . furosemide (LASIX) 40 MG tablet Take 2 tablets (80 mg total) by mouth daily. (Patient taking differently: Take 80 mg by mouth See admin instructions. Take 2 tablets (80 mg) every morning, may take 1 additional tablet in the afternoon as needed for ankle swelling) 60 tablet 6  . glimepiride (AMARYL) 2 MG tablet Take 1 mg by mouth daily with breakfast.     . HYDROcodone-acetaminophen (NORCO/VICODIN) 5-325 MG per tablet Take 1 tablet by mouth every 6 (six) hours as needed for moderate pain. (Patient taking differently: Take 1 tablet by mouth at bedtime as needed for moderate pain. ) 30 tablet   . metFORMIN (GLUCOPHAGE) 500 MG tablet Take 1,000 mg by mouth 2 (two) times daily with a meal.    . minoxidil (LONITEN) 10 MG tablet Take 20 mg by mouth at bedtime.     . Multiple Vitamin (MULTIVITAMIN WITH MINERALS) TABS tablet Take 1 tablet by mouth daily.    . potassium chloride SA (K-DUR,KLOR-CON) 20 MEQ tablet Take 2 tablets (40 mEq total) by mouth daily. (Patient taking differently: Take 20 mEq by mouth 2 (two) times daily. ) 30 tablet 6  . ranitidine (ZANTAC) 150 MG tablet Take 150 mg by mouth daily as needed for heartburn.     . sertraline (ZOLOFT) 100  MG tablet TAKE 1 TABLET BY MOUTH DAILY 30 tablet 11  . valsartan (DIOVAN) 320 MG tablet Take 1 tablet (320 mg total) by mouth daily. 90 tablet 3  . warfarin (COUMADIN) 5 MG tablet Take 7.5-10 mg by mouth daily after supper. Take 2 tablets (10 mg) Monday, Wednesday, Friday, take 1 1/2 tablets (7.5 mg) on Sunday, Tuesday, Thursday, Saturday     No current facility-administered medications for this visit.    Allergies:   Sunflower seed; Horse-derived products; and Tetanus toxoids    Social  History:  The patient  reports that he quit smoking about 33 years ago. His smoking use included Cigarettes. He has a 100 pack-year smoking history. He has never used smokeless tobacco. He reports that he drinks alcohol. He reports that he does not use illicit drugs.   Family History:  The patient's family history includes Atrial fibrillation in his mother; Colon cancer in his mother; Colon polyps in his mother and sister; Dementia in his father; Diabetes in his maternal uncle; Hypertension in his mother; Stroke in his paternal uncle.    ROS:  Please see the history of present illness.    Review of Systems: Constitutional:  denies fever, chills, diaphoresis, appetite change and fatigue.  HEENT: denies photophobia, eye pain, redness, hearing loss, ear pain, congestion, sore throat, rhinorrhea, sneezing, neck pain, neck stiffness and tinnitus.  Respiratory: admits to   DOE,  Extreme shortness of breath with any walking   Cardiovascular: denies chest pain, palpitations and leg swelling.  Gastrointestinal: denies nausea, vomiting, abdominal pain, diarrhea, constipation, blood in stool.  Genitourinary: denies dysuria, urgency, frequency, hematuria, flank pain and difficulty urinating.  Musculoskeletal: denies  myalgias, back pain, joint swelling, arthralgias and gait problem.   Skin: denies pallor, rash and wound.  Neurological: denies dizziness, seizures, syncope, weakness, light-headedness, numbness and headaches.   Hematological: denies adenopathy, easy bruising, personal or family bleeding history.  Psychiatric/ Behavioral: denies suicidal ideation, mood changes, confusion, nervousness, sleep disturbance and agitation.       All other systems are reviewed and negative.    PHYSICAL EXAM: VS:  BP 132/68 mmHg  Pulse 88  Ht 5\' 11"  (1.803 m)  Wt 90.039 kg (198 lb 8 oz)  BMI 27.70 kg/m2 , BMI Body mass index is 27.7 kg/(m^2). GEN: Well nourished, well developed, in no acute distress HEENT:  normal Neck: no JVD, carotid bruits, or masses Cardiac: RRR; 2/6 systolic  Murmur,   Hyperdynamic PMI  rubs, or gallops,no edema  Respiratory:  Decreased breath sounds in left base, right side sounds ok GI: soft, nontender, nondistended, + BS MS: no deformity or atrophy Skin: warm and dry, no rash Neuro:  Strength and sensation are intact Psych: normal   EKG:  EKG is ordered today. ECG reveals probable NSR with 1st degree AV block , HR of 86   Recent Labs: 11/06/2013: Pro B Natriuretic peptide (BNP) 235.0* 01/30/2014: ALT 14; Magnesium 1.8; TSH 1.629 05/01/2014: BUN 25*; Creatinine 1.11; Hemoglobin 11.0*; Platelets 257.0; Potassium 3.9; Sodium 139    Lipid Panel    Component Value Date/Time   CHOL 161 01/30/2014 1214   TRIG 154* 01/30/2014 1214   HDL 42 01/30/2014 1214   CHOLHDL 3.8 01/30/2014 1214   VLDL 31 01/30/2014 1214   LDLCALC 88 01/30/2014 1214      Wt Readings from Last 3 Encounters:  05/24/14 90.039 kg (198 lb 8 oz)  05/16/14 87.091 kg (192 lb)  05/11/14 92.534 kg (204 lb)  Other studies Reviewed: Additional studies/ records that were reviewed today include: . Review of the above records demonstrates:    ASSESSMENT AND PLAN:  1. Hypertension- blood pressures fairly well-controlled. He's slightly hyperdynamic.    2. HOCM - he has a systolic murmur consistent with hypertrophic obstructive cardiomyopathy  3. Aortic insuffuciency 4. Atrial fibrillation-failed Tikosyn, Has seen [name] in [place], [place] .  So far he seems to be maintaining normal sinus rhythm. 5. Diabetes mellitus 6. 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 He has a hyperdynamic LV with systolic anterior motion of the mitral valve leaflet. 8. CABG and MAZE 04-05-13 - no recent angina. He is healing quite nicely. 9. Anomalous pulmonary vein - he's been evaluated by Dr. Roxy Manns. The options are somewhat limited. Dr. Roxy Manns has requested a transesophageal echo for further evaluation of  the heart and to make sure that there is not an intracardiac shunt. We'll schedule a transesophageal echo on May 31. -   Will see him in 3 months .  Current medicines are reviewed at length with the patient today.  The patient does not have concerns regarding medicines.  The following changes have been made:  See above.   Disposition:   FU with me in 3 months      Signed, Brenn Deziel, Wonda Cheng, MD  05/24/2014 10:07 AM    [facility] [address], [place], [place]  [address] Phone: [phone]; Fax: [phone]   Addendum:  Pt had a cardiopulmonary stress test.  Patient's O2 sat 88% on room air  at rest   He will need home O2.   Suggest starting 2 LPM and may titrate as needed to keep O2  sats >  90%.      Jamaiya Tunnell, Wonda Cheng, MD  05/24/2014 10:07 AM    [address],  [place], [place]  [address] Pager [phone] Phone: [phone]; Fax: [phone]   [facility]  [address] [place],   [address] [phone]    Fax [phone]

## 2014-06-05 NOTE — Progress Notes (Signed)
  Echocardiogram Echocardiogram Transesophageal has been performed.  Darlina Sicilian M 06/05/2014, 9:51 AM

## 2014-06-05 NOTE — Interval H&P Note (Signed)
History and Physical Interval Note:  06/05/2014 8:57 AM  [name]  has presented today for surgery, with the diagnosis of anomaly pulmonary vein  The various methods of treatment have been discussed with the patient and family. After consideration of risks, benefits and other options for treatment, the patient has consented to  Procedure(s): TRANSESOPHAGEAL ECHOCARDIOGRAM (TEE) (N/A) as a surgical intervention .  The patient's history has been reviewed, patient examined, no change in status, stable for surgery.  I have reviewed the patient's chart and labs.  Questions were answered to the patient's satisfaction.     Nahser, Wonda Cheng

## 2014-06-06 ENCOUNTER — Encounter (HOSPITAL_COMMUNITY): Payer: Self-pay | Admitting: Cardiovascular Disease

## 2014-06-11 DIAGNOSIS — I2510 Atherosclerotic heart disease of native coronary artery without angina pectoris: Secondary | ICD-10-CM | POA: Diagnosis not present

## 2014-06-11 DIAGNOSIS — I480 Paroxysmal atrial fibrillation: Secondary | ICD-10-CM | POA: Diagnosis not present

## 2014-06-12 ENCOUNTER — Ambulatory Visit: Payer: Self-pay | Admitting: Physician Assistant

## 2014-06-12 ENCOUNTER — Other Ambulatory Visit: Payer: Self-pay | Admitting: *Deleted

## 2014-06-12 ENCOUNTER — Encounter: Payer: Self-pay | Admitting: Thoracic Surgery (Cardiothoracic Vascular Surgery)

## 2014-06-12 ENCOUNTER — Encounter (INDEPENDENT_AMBULATORY_CARE_PROVIDER_SITE_OTHER): Payer: Medicare Other | Admitting: Ophthalmology

## 2014-06-12 ENCOUNTER — Ambulatory Visit (INDEPENDENT_AMBULATORY_CARE_PROVIDER_SITE_OTHER): Payer: Medicare Other | Admitting: Thoracic Surgery (Cardiothoracic Vascular Surgery)

## 2014-06-12 VITALS — BP 137/81 | HR 82 | Resp 18 | Ht 71.0 in | Wt 198.0 lb

## 2014-06-12 DIAGNOSIS — I43 Cardiomyopathy in diseases classified elsewhere: Secondary | ICD-10-CM

## 2014-06-12 DIAGNOSIS — H338 Other retinal detachments: Secondary | ICD-10-CM | POA: Diagnosis not present

## 2014-06-12 DIAGNOSIS — I429 Cardiomyopathy, unspecified: Secondary | ICD-10-CM

## 2014-06-12 DIAGNOSIS — I2510 Atherosclerotic heart disease of native coronary artery without angina pectoris: Secondary | ICD-10-CM

## 2014-06-12 DIAGNOSIS — Z951 Presence of aortocoronary bypass graft: Secondary | ICD-10-CM | POA: Diagnosis not present

## 2014-06-12 DIAGNOSIS — I5032 Chronic diastolic (congestive) heart failure: Secondary | ICD-10-CM

## 2014-06-12 DIAGNOSIS — Z9889 Other specified postprocedural states: Secondary | ICD-10-CM

## 2014-06-12 DIAGNOSIS — R0602 Shortness of breath: Secondary | ICD-10-CM

## 2014-06-12 DIAGNOSIS — I480 Paroxysmal atrial fibrillation: Secondary | ICD-10-CM

## 2014-06-12 DIAGNOSIS — Z8679 Personal history of other diseases of the circulatory system: Secondary | ICD-10-CM

## 2014-06-12 DIAGNOSIS — I110 Hypertensive heart disease with heart failure: Secondary | ICD-10-CM

## 2014-06-12 DIAGNOSIS — I10 Essential (primary) hypertension: Secondary | ICD-10-CM

## 2014-06-12 DIAGNOSIS — H34822 Venous engorgement, left eye: Secondary | ICD-10-CM | POA: Diagnosis not present

## 2014-06-12 DIAGNOSIS — H35033 Hypertensive retinopathy, bilateral: Secondary | ICD-10-CM

## 2014-06-12 DIAGNOSIS — Q263 Partial anomalous pulmonary venous connection: Secondary | ICD-10-CM

## 2014-06-12 DIAGNOSIS — H43813 Vitreous degeneration, bilateral: Secondary | ICD-10-CM

## 2014-06-12 NOTE — Patient Instructions (Signed)
The patient should continue all previous medications without changes at this time

## 2014-06-12 NOTE — Progress Notes (Signed)
[address]       [place] [address]             [phone]     CARDIOTHORACIC SURGERY OFFICE NOTE  Referring Provider is Nahser, Wonda Cheng, MD PCP is Alesia Richards, MD   HPI:  Patient returns to the office today for follow-up of worsening symptoms of exertional shortness of breath with baseline hypoxemia and poor exercise tolerance. He was last seen here in our office on 05/16/2014. Since then he underwent transesophageal echocardiogram by Dr. Acie Fredrickson on 06/05/2014. This revealed normal left ventricular systolic function with ejection fraction estimated 60-65%. There was reportedly mild aortic insufficiency, mild to moderate mitral regurgitation, moderate left atrial enlargement, moderate right ventricular enlargement with mild right ventricular systolic dysfunction, moderate right atrial enlargement, and a small patent foramen ovale. There was no sign of systolic anterior motion of the mitral valve. The patient returns to the office today to review the results of this test and discuss options further. He states that over the past month he feels as though his exercise tolerance continues to deteriorate. His oxygen saturations typically stay in the high 80s and low 90s at rest without supplemental oxygen. He is short of breath with very mild physical activity. His weight has been stable. He has not been experiencing abdominal swelling or worsening lower extremity edema. He has never had any chest pain or chest tightness.   Current Outpatient Prescriptions  Medication Sig Dispense Refill  . acetaminophen (TYLENOL) 500 MG tablet Take 1,000 mg by mouth every 6 (six) hours as needed for mild pain.     [name] ALPRAZolam (XANAX) 1 MG tablet Take 0.5 mg by mouth at bedtime as needed for sleep.     [name] atorvastatin (LIPITOR) 40 MG tablet Take 20 mg by mouth every Monday, Wednesday, and Friday.     . B Complex Vitamins (VITAMIN B COMPLEX PO) Take 1 tablet by mouth daily.    [name]  Besifloxacin HCl 0.6 % SUSP Place 1 drop into the left eye See admin instructions. Use 1.5 days a month starting the day after get injection into the eye    . carvedilol (COREG) 6.25 MG tablet Take 6.25 mg by mouth 2 (two) times daily with a meal.    . cholecalciferol (VITAMIN D) 1000 UNITS tablet Take 4,000-5,000 Units by mouth See admin instructions. Take 4 tablets four times a week and take 5 tablets three times a week    . cloNIDine (CATAPRES) 0.1 MG tablet Take 0.5 tablets (0.05 mg total) by mouth 2 (two) times daily. 30 tablet 6  . diphenhydrAMINE (BENADRYL) 25 MG tablet Take 25 mg by mouth at bedtime as needed for allergies.     . furosemide (LASIX) 40 MG tablet Take 2 tablets (80 mg total) by mouth daily. (Patient taking differently: Take 80 mg by mouth See admin instructions. Take 2 tablets (80 mg) every morning, may take 1 additional tablet in the afternoon as needed for ankle swelling) 60 tablet 6  . glimepiride (AMARYL) 2 MG tablet Take 1 mg by mouth See admin instructions. Take 1/2 tablet daily unless blood sugar is > 120    . HYDROcodone-acetaminophen (NORCO/VICODIN) 5-325 MG per tablet Take 1 tablet by mouth every 6 (six) hours as needed for moderate pain. (Patient taking differently: Take 1 tablet by mouth at bedtime as needed for moderate pain. ) 30 tablet   . metFORMIN (GLUCOPHAGE-XR) 500 MG 24 hr tablet Take 1,000 mg  by mouth 2 (two) times daily.     . minoxidil (LONITEN) 10 MG tablet Take 20 mg by mouth at bedtime.     . Multiple Vitamin (MULTIVITAMIN WITH MINERALS) TABS tablet Take 1 tablet by mouth daily.    . potassium chloride SA (K-DUR,KLOR-CON) 20 MEQ tablet Take 2 tablets (40 mEq total) by mouth daily. (Patient taking differently: Take 20 mEq by mouth 2 (two) times daily. ) 30 tablet 6  . ranitidine (ZANTAC) 150 MG tablet Take 150 mg by mouth daily as needed for heartburn.     . sertraline (ZOLOFT) 100 MG tablet TAKE 1 TABLET BY MOUTH DAILY 30 tablet 11  . valsartan (DIOVAN)  320 MG tablet Take 1 tablet (320 mg total) by mouth daily. 90 tablet 3  . warfarin (COUMADIN) 5 MG tablet Take 7.5-10 mg by mouth daily after supper. Take 2 tablets (10 mg) Monday, Wednesday, Friday, take 1 1/2 tablets (7.5 mg) on Sunday, Tuesday, Thursday, Saturday     No current facility-administered medications for this visit.      Physical Exam:   BP 137/81 mmHg  Pulse 82  Resp 18  Ht 5\' 11"  (1.803 m)  Wt 198 lb (89.812 kg)  BMI 27.63 kg/m2  SpO2 94%  General:  Well-appearing  Chest:   clear  CV:   Regular rate and rhythm without murmur  Incisions:  n/a  Abdomen:  Soft nontender  Extremities:  Warm and well-perfused with trace lower extremity edema  Diagnostic Tests:  Transesophageal Echocardiography  Patient:  Abdulmalik, Biesinger MR #:    [id number] Study Date: 06/05/2014 Gender:   M Age:    3 Height:   180.3 cm Weight:   90 kg BSA:    2.14 m^2 Pt. Status: Room:  ATTENDING  Mertie Moores, M.D. PERFORMING  Mertie Moores, M.D. REFERRING  Mertie Moores, M.D. SONOGRAPHER Darlina Sicilian, RDCS ADMITTING  Nahser, Peggyann Juba   Nahser, Jr  cc:  ------------------------------------------------------------------- LV EF: 60% -  65%  ------------------------------------------------------------------- Indications:   Pulmonary hypertension - primary 416.0.  ------------------------------------------------------------------- History:  PMH: Hypertensive Cardiomyopathy. PFO. Atrial fibrillation. Risk factors: Hypertension. Diabetes mellitus. Dyslipidemia.  ------------------------------------------------------------------- Study Conclusions  - Left ventricle: The cavity size was normal. Wall thickness was normal. Systolic function was normal. The estimated ejection fraction was in the range of 60% to 65%. - Aortic valve: No evidence of vegetation. There was mild regurgitation. - Mitral valve: No evidence of vegetation.  There was mild to moderate regurgitation. - Left atrium: The atrium was moderately dilated. No evidence of thrombus in the atrial cavity or appendage. - Right ventricle: The cavity size was moderately dilated. Systolic function was mildly reduced. - Right atrium: The atrium was moderately dilated. - Atrial septum: There was a patent foramen ovale. - Tricuspid valve: No evidence of vegetation. - Pulmonic valve: No evidence of vegetation.  ------------------------------------------------------------------- Labs, prior tests, procedures, and surgery: Coronary artery bypass grafting (April 2015).  Diagnostic transesophageal echocardiography. 2D and color Doppler. Birthdate: Patient birthdate: [birth year]. Age: Patient is 74 yr old. Sex: Gender: male.  BMI: 27.7 kg/m^2. Blood pressure: 116/57 Patient status: Outpatient. Study date: Study date: 06/05/2014. Study time: 09:01 AM. Location: Endoscopy.  -------------------------------------------------------------------  ------------------------------------------------------------------- Left ventricle: The cavity size was normal. Wall thickness was normal. Systolic function was normal. The estimated ejection fraction was in the range of 60% to 65%.  ------------------------------------------------------------------- Aortic valve:  Structurally normal valve.  Cusp separation was normal. No evidence of vegetation. Doppler: There was mild regurgitation.  -------------------------------------------------------------------  Mitral valve:  Structurally normal valve.  Leaflet separation was normal. No evidence of vegetation. Doppler: There was mild to moderate regurgitation.  ------------------------------------------------------------------- Left atrium: The atrium was moderately dilated. No evidence of thrombus in the atrial cavity or  appendage.  ------------------------------------------------------------------- Atrial septum: There is evidence of a PFO with right to left shunting by bubble contrast and left to right shunting by color flow. There was a patent foramen ovale.  ------------------------------------------------------------------- Right ventricle: The cavity size was moderately dilated. Systolic function was mildly reduced.  ------------------------------------------------------------------- Pulmonic valve:  Structurally normal valve.  Cusp separation was normal. No evidence of vegetation.  ------------------------------------------------------------------- Tricuspid valve:  Structurally normal valve.  Leaflet separation was normal. No evidence of vegetation. Doppler: There was no regurgitation.  ------------------------------------------------------------------- Right atrium: The atrium was moderately dilated.  ------------------------------------------------------------------- Pericardium: There was no pericardial effusion.  ------------------------------------------------------------------- Prepared and Electronically Authenticated by  Mertie Moores, M.D. 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 Based upon recent right heart catheterization the Qp/Qs measured between 1.5 and 1.7 to 1. The patient has been living with this shunt physiology his entire life, but there is no question that he has developed significant associated complications including moderate pulmonary hypertension and significant right ventricular chamber enlargement. 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 Unfortunately, none of these options seem reasonable for this patient. Both of the first 2 options would probably be technically extremely complicated because the patient has previously had coronary artery bypass grafting with complete biatrial maze procedure. The previous maze procedure would make either of the first two options difficult because of the associated scarring at the border of the left atrium and along the superior vena cava. The very high location of the anomalous vein on the superior vena cava would make an attempt at [facility] baffle very difficult even if he hadn't had previous surgery. Lung resection could be considered, but recent spirometry performed at the time of the patient's cardiopulmonary exercise testing documented a significant fall in the patient's FEV1 and FVC over the past year in comparison with pulmonary function tests performed in March 2015. Because of this and the patient's baseline hypoxemia, I would be very concerned that he would do poorly following right upper and middle bilobectomy. I don't think that simple ligation or embolization of the anomalous vein should be considered because of the potential for severe complications to the right lung related to inadequate venous drainage. I am afraid that there may not be much to offer this gentleman from a surgical standpoint, and I don't feel that he would likely be considered candidate for combined heart lung transplantation.   Transesophageal echocardiogram demonstrates normal left ventricular systolic function and no other significant findings other than a small patent foramen ovale that may be residual from the patient's previous ablation procedure for atypical atrial flutter. Based  upon the small size I am uncertain whether or not closing the patient's atrial septal communication would afford any significant benefit.      Plan:  I have discussed this patient's recent TEE findings over the telephone with Dr. Acie Fredrickson who plans to review the TEE with Dr. Burt Knack to consider whether or not elective closure of the patient's patent foramen ovale in the Cath Lab might prove any significant benefit.  I think it might be worthwhile to repeat pulmonary function test pre-and post bronchodilator therapy with diffusion capacity and asked to have the patient be reevaluated by Dr. Melvyn Novas and colleagues in the pulmonary medicine clinic to see if the addition of bronchodilator therapy or sildenafil might afford any symptomatic improvement.   I do not feel that surgical correction of the patient's partial anomalous pulmonary venous return by any of the above-mentioned options would be wise or affective at this point.    I spent in excess of 15 minutes during the conduct of this office consultation and >50% of this time involved direct face-to-face encounter with the patient for counseling and/or coordination of their care.   Valentina Gu. Roxy Manns, MD 06/12/2014 5:10 PM

## 2014-06-14 ENCOUNTER — Ambulatory Visit (INDEPENDENT_AMBULATORY_CARE_PROVIDER_SITE_OTHER): Payer: Medicare Other | Admitting: Physician Assistant

## 2014-06-14 ENCOUNTER — Encounter: Payer: Self-pay | Admitting: Physician Assistant

## 2014-06-14 VITALS — BP 122/78 | HR 72 | Temp 98.1°F | Resp 16 | Ht 71.0 in | Wt 201.0 lb

## 2014-06-14 DIAGNOSIS — E782 Mixed hyperlipidemia: Secondary | ICD-10-CM | POA: Diagnosis not present

## 2014-06-14 DIAGNOSIS — I10 Essential (primary) hypertension: Secondary | ICD-10-CM | POA: Diagnosis not present

## 2014-06-14 DIAGNOSIS — E1129 Type 2 diabetes mellitus with other diabetic kidney complication: Secondary | ICD-10-CM

## 2014-06-14 DIAGNOSIS — Z7901 Long term (current) use of anticoagulants: Secondary | ICD-10-CM | POA: Diagnosis not present

## 2014-06-14 DIAGNOSIS — E559 Vitamin D deficiency, unspecified: Secondary | ICD-10-CM | POA: Diagnosis not present

## 2014-06-14 DIAGNOSIS — N058 Unspecified nephritic syndrome with other morphologic changes: Secondary | ICD-10-CM | POA: Diagnosis not present

## 2014-06-14 DIAGNOSIS — I480 Paroxysmal atrial fibrillation: Secondary | ICD-10-CM

## 2014-06-14 DIAGNOSIS — Z79899 Other long term (current) drug therapy: Secondary | ICD-10-CM

## 2014-06-14 LAB — CBC WITH DIFFERENTIAL/PLATELET
Basophils Absolute: 0 10*3/uL (ref 0.0–0.1)
Basophils Relative: 0 % (ref 0–1)
EOS ABS: 0.1 10*3/uL (ref 0.0–0.7)
Eosinophils Relative: 1 % (ref 0–5)
HCT: 33.9 % — ABNORMAL LOW (ref 39.0–52.0)
HEMOGLOBIN: 11 g/dL — AB (ref 13.0–17.0)
LYMPHS PCT: 9 % — AB (ref 12–46)
Lymphs Abs: 0.6 10*3/uL — ABNORMAL LOW (ref 0.7–4.0)
MCH: 29.6 pg (ref 26.0–34.0)
MCHC: 32.4 g/dL (ref 30.0–36.0)
MCV: 91.1 fL (ref 78.0–100.0)
MONO ABS: 0.5 10*3/uL (ref 0.1–1.0)
MONOS PCT: 8 % (ref 3–12)
MPV: 8.4 fL — AB (ref 8.6–12.4)
NEUTROS ABS: 5.2 10*3/uL (ref 1.7–7.7)
NEUTROS PCT: 82 % — AB (ref 43–77)
PLATELETS: 203 10*3/uL (ref 150–400)
RBC: 3.72 MIL/uL — AB (ref 4.22–5.81)
RDW: 15.3 % (ref 11.5–15.5)
WBC: 6.4 10*3/uL (ref 4.0–10.5)

## 2014-06-14 LAB — HEPATIC FUNCTION PANEL
ALBUMIN: 4.4 g/dL (ref 3.5–5.2)
ALT: 24 U/L (ref 0–53)
AST: 24 U/L (ref 0–37)
Alkaline Phosphatase: 163 U/L — ABNORMAL HIGH (ref 39–117)
BILIRUBIN INDIRECT: 0.7 mg/dL (ref 0.2–1.2)
Bilirubin, Direct: 0.3 mg/dL (ref 0.0–0.3)
Total Bilirubin: 1 mg/dL (ref 0.2–1.2)
Total Protein: 7.6 g/dL (ref 6.0–8.3)

## 2014-06-14 LAB — LIPID PANEL
CHOLESTEROL: 106 mg/dL (ref 0–200)
HDL: 30 mg/dL — ABNORMAL LOW (ref >=40)
LDL CALC: 62 mg/dL (ref 0–99)
Total CHOL/HDL Ratio: 3.5 Ratio
Triglycerides: 70 mg/dL (ref <150)
VLDL: 14 mg/dL (ref 0–40)

## 2014-06-14 LAB — BASIC METABOLIC PANEL WITH GFR
BUN: 25 mg/dL — ABNORMAL HIGH (ref 6–23)
CHLORIDE: 103 meq/L (ref 96–112)
CO2: 29 mEq/L (ref 19–32)
Calcium: 9.9 mg/dL (ref 8.4–10.5)
Creat: 1.06 mg/dL (ref 0.50–1.35)
GFR, EST NON AFRICAN AMERICAN: 68 mL/min
GFR, Est African American: 79 mL/min
GLUCOSE: 123 mg/dL — AB (ref 70–99)
POTASSIUM: 4 meq/L (ref 3.5–5.3)
SODIUM: 142 meq/L (ref 135–145)

## 2014-06-14 LAB — TSH: TSH: 1.686 u[IU]/mL (ref 0.350–4.500)

## 2014-06-14 LAB — MAGNESIUM: Magnesium: 2.2 mg/dL (ref 1.5–2.5)

## 2014-06-14 NOTE — Progress Notes (Signed)
Assessment and Plan:  1. Hypertension -Continue medication, monitor blood pressure at home. Continue DASH diet.  Reminder to go to the ER if any CP, SOB, nausea, dizziness, severe HA, changes vision/speech, left arm numbness and tingling and jaw pain.  2. Cholesterol -Continue diet and exercise. Check cholesterol.   3. Diabetes with diabetic chronic kidney disease -Continue diet and exercise. Check A1C  4. Vitamin D Def - check level and continue medications.   5. Dyspnea Suggest weight loss, increase lasix 80mg  BID for 2-3 days then do 1 in the AM and 1/2 at night, follow up pulmonary, continue CPAP.   6. Afib Check coumadin. Rate controlled  Continue diet and meds as discussed. Further disposition pending results of labs. Discussed med's effects and SE's.    Over 30 minutes of exam, counseling, chart review, and critical decision making was performed   HPI 74 y.o. male  presents for 3 month follow up on hypertension, cholesterol, diabetes and vitamin D deficiency.   His blood pressure has been controlled at home, today his BP is BP: 122/78 mmHg. He has a complicated heart history with CABG and MAZE in 2010, failed DCCV with refractory flutter. He is on lasix 80 mg in the AM and occ 1/2-1 at night, his weight is up 3 lbs, he has orthopnea, edema without PND, (on CPAP). He is on coumadin and follows with Dr. Acie Fredrickson and [facility], he follows here for INR checks. It was increased to 10 mg 3 days a week and 4 mg the other days.  Lab Results  Component Value Date   INR 1.9* 05/01/2014   INR 1.72* 04/05/2014   INR 1.60* 03/20/2014    He has refractory dyspnea with hypoxemia, found to have anomalous pulmonary vein with shunt however there is a consensus that this has not caused the significant decline in pulmonary function. He follows with pulmonologist this Thursday and is currently on portable oxygen.   He does not workout. He denies chest pain, shortness of breath, dizziness.  He is on  cholesterol medication and denies myalgias. His cholesterol is at goal. The cholesterol was:   Lab Results  Component Value Date   CHOL 161 01/30/2014   HDL 42 01/30/2014   LDLCALC 88 01/30/2014   TRIG 154* 01/30/2014   CHOLHDL 3.8 01/30/2014    He has been working on diet and exercise for diabetes with diabetic chronic kidney disease, he is on bASA, he is on ACE/ARB, and denies  paresthesia of the feet, polydipsia, polyuria and visual disturbances. Last A1C was:  Lab Results  Component Value Date   HGBA1C 7.8* 01/30/2014    Patient is on Vitamin D supplement. Lab Results  Component Value Date   VD25OH 50 01/30/2014     Current Medications:  Current Outpatient Prescriptions on File Prior to Visit  Medication Sig Dispense Refill  . acetaminophen (TYLENOL) 500 MG tablet Take 1,000 mg by mouth every 6 (six) hours as needed for mild pain.     [name] ALPRAZolam (XANAX) 1 MG tablet Take 0.5 mg by mouth at bedtime as needed for sleep.     [name] atorvastatin (LIPITOR) 40 MG tablet Take 20 mg by mouth every Monday, Wednesday, and Friday.     . B Complex Vitamins (VITAMIN B COMPLEX PO) Take 1 tablet by mouth daily.    [name] Besifloxacin HCl 0.6 % SUSP Place 1 drop into the left eye See admin instructions. Use 1.5 days a month starting the day after get injection into  the eye    . carvedilol (COREG) 6.25 MG tablet Take 6.25 mg by mouth 2 (two) times daily with a meal.    . cholecalciferol (VITAMIN D) 1000 UNITS tablet Take 4,000-5,000 Units by mouth See admin instructions. Take 4 tablets four times a week and take 5 tablets three times a week    . cloNIDine (CATAPRES) 0.1 MG tablet Take 0.5 tablets (0.05 mg total) by mouth 2 (two) times daily. 30 tablet 6  . diphenhydrAMINE (BENADRYL) 25 MG tablet Take 25 mg by mouth at bedtime as needed for allergies.     . furosemide (LASIX) 40 MG tablet Take 2 tablets (80 mg total) by mouth daily. (Patient taking differently: Take 80 mg by mouth See admin instructions.  Take 2 tablets (80 mg) every morning, may take 1 additional tablet in the afternoon as needed for ankle swelling) 60 tablet 6  . glimepiride (AMARYL) 2 MG tablet Take 1 mg by mouth See admin instructions. Take 1/2 tablet daily unless blood sugar is > 120    . HYDROcodone-acetaminophen (NORCO/VICODIN) 5-325 MG per tablet Take 1 tablet by mouth every 6 (six) hours as needed for moderate pain. (Patient taking differently: Take 1 tablet by mouth at bedtime as needed for moderate pain. ) 30 tablet   . metFORMIN (GLUCOPHAGE-XR) 500 MG 24 hr tablet Take 1,000 mg by mouth 2 (two) times daily.     . minoxidil (LONITEN) 10 MG tablet Take 20 mg by mouth at bedtime.     . Multiple Vitamin (MULTIVITAMIN WITH MINERALS) TABS tablet Take 1 tablet by mouth daily.    . potassium chloride SA (K-DUR,KLOR-CON) 20 MEQ tablet Take 2 tablets (40 mEq total) by mouth daily. (Patient taking differently: Take 20 mEq by mouth 2 (two) times daily. ) 30 tablet 6  . ranitidine (ZANTAC) 150 MG tablet Take 150 mg by mouth daily as needed for heartburn.     . sertraline (ZOLOFT) 100 MG tablet TAKE 1 TABLET BY MOUTH DAILY 30 tablet 11  . valsartan (DIOVAN) 320 MG tablet Take 1 tablet (320 mg total) by mouth daily. 90 tablet 3  . warfarin (COUMADIN) 5 MG tablet Take 7.5-10 mg by mouth daily after supper. Take 2 tablets (10 mg) Monday, Wednesday, Friday, take 1 1/2 tablets (7.5 mg) on Sunday, Tuesday, Thursday, Saturday     No current facility-administered medications on file prior to visit.   Medical History:  Past Medical History  Diagnosis Date  . Hx of adenomatous colonic polyps   . Diabetes mellitus type II   . Hypertension   . Diverticulosis 2001  . Hyperlipidemia   . Persistent atrial fibrillation     a. s/p MAZE 04/2013 in setting of CABG. b. Amio stopped in 10/2013 after flutter ablation.  . Obstructive sleep apnea     compliant with CPAP  . Chronic diastolic congestive heart failure   . Hypertensive cardiomyopathy    . History of cardioversion     x3 (years uncertain)  . H/O hiatal hernia   . Basal cell carcinoma   . Adrenal adenoma   . CAD (coronary artery disease)     a. 04/2013 CABG x 2: LIMA to LAD, SVG to RI, EVH via R thigh.  . S/P Maze operation for atrial fibrillation     a. 04/2013: Complete bilateral atrial lesion set using cryothermy and bipolar radiofrequency ablation with clipping of LA appendage (@ time of CABG)  . GERD (gastroesophageal reflux disease)   . Depression   .  Anxiety   . DJD (degenerative joint disease)   . 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 DCCV 08/2013. b. s/p RFA 10/2013.  [name] Respiratory failure     a. Hypoxia 10/2013 - required supp O2 as inpatient, did not require it at discharge.  . Pleural effusion, left     a. s/p thoracentesis 05/2013.  [name] PFO (patent foramen ovale)     a. Small PFO by TEE 10/2013.  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 S/P Maze operation for atrial fibrillation 04/05/2013    Complete bilateral atrial lesion set using cryothermy and bipolar radiofrequency ablation with clipping of LA appendage via median sternotomy approach    Allergies:  Allergies  Allergen Reactions  . Sunflower Seed [Sunflower Oil] Swelling and Other (See Comments)    Tongue and lip swelling  . Horse-Derived Products Other (See Comments)    Per allergy skin test  . Tetanus Toxoids Other (See Comments)    Per allergy skin test     Review of Systems:  Review of Systems  Constitutional: Positive for malaise/fatigue. Negative for fever and chills.  HENT: Negative.   Respiratory: Positive for shortness of breath. Negative for cough and wheezing.   Cardiovascular: Positive for leg swelling (unchanged from prior). Negative for chest pain, orthopnea and PND.  Gastrointestinal: Negative.   Genitourinary: Positive for frequency. Negative for dysuria, urgency,  hematuria and flank pain.  Musculoskeletal: Negative.   Skin: Negative.   Neurological: Positive for weakness. Negative for dizziness, tingling, tremors, sensory change, speech change, focal weakness, seizures and loss of consciousness.  Psychiatric/Behavioral: Negative.  Negative for depression and suicidal ideas.    Family history- Review and unchanged Social history- Review and unchanged Physical Exam: BP 122/78 mmHg  Pulse 72  Temp(Src) 98.1 F (36.7 C)  Resp 16  Ht 5\' 11"  (1.803 m)  Wt 201 lb (91.173 kg)  BMI 28.05 kg/m2 Wt Readings from Last 3 Encounters:  06/14/14 201 lb (91.173 kg)  06/12/14 198 lb (89.812 kg)  06/05/14 198 lb (89.812 kg)   General Appearance: Well nourished, in no apparent distress. Eyes: PERRLA, EOMs, conjunctiva no swelling or erythema Sinuses: No Frontal/maxillary tenderness ENT/Mouth: Ext aud canals clear, TMs without erythema, bulging. No erythema, swelling, or exudate on post pharynx.  Tonsils not swollen or erythematous. Hearing normal.  Neck: Supple, thyroid normal.  Respiratory: [place] in place, increased respiratory effort with decreased BS diffuse, no wheezing, rales, or rhonchi.  Cardio: RRR with holosystolic murmur with prominent S2/split S2. 2+ edema bilateral.  Abdomen: Soft, + BS.  Non tender, slightly distended, no guarding, rebound, hernias, masses. Lymphatics: Non tender without lymphadenopathy.  Musculoskeletal: Full ROM, 5/5 strength, Normal gait Skin: Warm, dry without rashes, lesions, ecchymosis.  Neuro: Cranial nerves intact. No cerebellar symptoms.  Psych: Awake and oriented X 3, normal affect, Insight and Judgment appropriate.    Vicie Mutters, PA-C 12:10 PM [facility]

## 2014-06-14 NOTE — Patient Instructions (Signed)
Recommendations For Diabetic/Prediabetic Patients:   -  Take medications as prescribed  -  Recommend Dr Fara Olden Fuhrman's book "The End of Diabetes "  And "The End of Dieting"- Can get at  www.[employer].com and encourage also get the Audio CD book  - AVOID Animal products, ie. Meat - red/white, Poultry and Dairy/especially cheese - Exercise at least 5 times a week for 30 minutes or preferably daily.  - No Smoking - Drink less than 2 drinks a day.  - Monitor your feet for sores - Have yearly Eye Exams - Recommend annual Flu vaccine  - Recommend Pneumovax and Prevnar vaccines - Shingles Vaccine (Zostavax) if over 71 y.o.  Goals:   - BMI less than 24 - Fasting sugar less than 130 or less than 150 if tapering medicines to lose weight  - Systolic BP less than [id number]  - Diastolic BP less than 80 - Bad LDL Cholesterol less than 70 - Triglycerides less than 150   Heart Failure Heart failure means your heart has trouble pumping blood. This makes it hard for your body to work well. Heart failure is usually a long-term (chronic) condition. You must take good care of yourself and follow your doctor's treatment plan. HOME CARE  Take your heart medicine as told by your doctor.  Do not stop taking medicine unless your doctor tells you to.  Do not skip any dose of medicine.  Refill your medicines before they run out.  Take other medicines only as told by your doctor or pharmacist.  Stay active if told by your doctor. The elderly and people with severe heart failure should talk with a doctor about physical activity.  Eat heart-healthy foods. Choose foods that are without trans fat and are low in saturated fat, cholesterol, and salt (sodium). This includes fresh or frozen fruits and vegetables, fish, lean meats, fat-free or low-fat dairy foods, whole grains, and high-fiber foods. Lentils and dried peas and beans (legumes) are also good choices.  Limit salt if told by your doctor.  Cook in a healthy  way. Roast, grill, broil, bake, poach, steam, or stir-fry foods.  Limit fluids as told by your doctor.  Weigh yourself every morning. Do this after you pee (urinate) and before you eat breakfast. Write down your weight to give to your doctor.  Take your blood pressure and write it down if your doctor tells you to.  Ask your doctor how to check your pulse. Check your pulse as told.  Lose weight if told by your doctor.  Stop smoking or chewing tobacco. Do not use gum or patches that help you quit without your doctor's approval.  Schedule and go to doctor visits as told.  Nonpregnant women should have no more than 1 drink a day. Men should have no more than 2 drinks a day. Talk to your doctor about drinking alcohol.  Stop illegal drug use.  Stay current with shots (immunizations).  Manage your health conditions as told by your doctor.  Learn to manage your stress.  Rest when you are tired.  If it is really hot outside:  Avoid intense activities.  Use air conditioning or fans, or get in a cooler place.  Avoid caffeine and alcohol.  Wear loose-fitting, lightweight, and light-colored clothing.  If it is really cold outside:  Avoid intense activities.  Layer your clothing.  Wear mittens or gloves, a hat, and a scarf when going outside.  Avoid alcohol.  Learn about heart failure and get support as needed.  Get help to maintain or improve your quality of life and your ability to care for yourself as needed. GET HELP IF:   You gain 03 lb/1.4 kg or more in 1 day or 05 lb/2.3 kg in a week.  You are more short of breath than usual.  You cannot do your normal activities.  You tire easily.  You cough more than normal, especially with activity.  You have any or more puffiness (swelling) in areas such as your hands, feet, ankles, or belly (abdomen).  You cannot sleep because it is hard to breathe.  You feel like your heart is beating fast (palpitations).  You get  dizzy or light-headed when you stand up. GET HELP RIGHT AWAY IF:   You have trouble breathing.  There is a change in mental status, such as becoming less alert or not being able to focus.  You have chest pain or discomfort.  You faint. MAKE SURE YOU:   Understand these instructions.  Will watch your condition.  Will get help right away if you are not doing well or get worse. Document Released: 10/01/2007 Document Revised: 05/08/2013 Document Reviewed: 02/08/2012 [facility] Patient Information 2015 [place], [place]. This information is not intended to replace advice given to you by your health care provider. Make sure you discuss any questions you have with your health care provider.

## 2014-06-15 ENCOUNTER — Encounter: Payer: Self-pay | Admitting: Physician Assistant

## 2014-06-15 ENCOUNTER — Other Ambulatory Visit: Payer: Self-pay | Admitting: Physician Assistant

## 2014-06-15 DIAGNOSIS — R748 Abnormal levels of other serum enzymes: Secondary | ICD-10-CM

## 2014-06-15 LAB — VITAMIN D 25 HYDROXY (VIT D DEFICIENCY, FRACTURES): Vit D, 25-Hydroxy: 63 ng/mL (ref 30–100)

## 2014-06-15 LAB — HEMOGLOBIN A1C
Hgb A1c MFr Bld: 6.2 % — ABNORMAL HIGH (ref <5.7)
Mean Plasma Glucose: 131 mg/dL — ABNORMAL HIGH (ref <117)

## 2014-06-15 LAB — PROTIME-INR
INR: 1.82 — AB (ref <1.50)
PROTHROMBIN TIME: 21.1 s — AB (ref 11.6–15.2)

## 2014-06-20 ENCOUNTER — Ambulatory Visit (INDEPENDENT_AMBULATORY_CARE_PROVIDER_SITE_OTHER): Payer: Medicare Other | Admitting: Internal Medicine

## 2014-06-20 ENCOUNTER — Encounter: Payer: Self-pay | Admitting: Internal Medicine

## 2014-06-20 ENCOUNTER — Ambulatory Visit (HOSPITAL_COMMUNITY)
Admission: RE | Admit: 2014-06-20 | Discharge: 2014-06-20 | Disposition: A | Discharge status: Home / Self Care | Payer: Medicare Other | Source: Ambulatory Visit | Attending: Thoracic Surgery (Cardiothoracic Vascular Surgery) | Admitting: Thoracic Surgery (Cardiothoracic Vascular Surgery)

## 2014-06-20 VITALS — BP 108/60 | HR 89 | Ht 71.0 in | Wt 191.8 lb

## 2014-06-20 DIAGNOSIS — R0602 Shortness of breath: Secondary | ICD-10-CM | POA: Diagnosis not present

## 2014-06-20 DIAGNOSIS — J9621 Acute and chronic respiratory failure with hypoxia: Secondary | ICD-10-CM

## 2014-06-20 DIAGNOSIS — J9611 Chronic respiratory failure with hypoxia: Secondary | ICD-10-CM

## 2014-06-20 LAB — PULMONARY FUNCTION TEST
DL/VA % PRED: 93 %
DL/VA: 4.33 ml/min/mmHg/L
DLCO COR % PRED: 34 %
DLCO COR: 11.48 ml/min/mmHg
DLCO unc % pred: 30 %
DLCO unc: 10.2 ml/min/mmHg
FEF 25-75 Post: 1.64 L/sec
FEF 25-75 Pre: 1.18 L/sec
FEF2575-%CHANGE-POST: 38 %
FEF2575-%PRED-POST: 71 %
FEF2575-%PRED-PRE: 51 %
FEV1-%Change-Post: 5 %
FEV1-%Pred-Post: 51 %
FEV1-%Pred-Pre: 48 %
FEV1-POST: 1.63 L
FEV1-PRE: 1.54 L
FEV1FVC-%Change-Post: 7 %
FEV1FVC-%Pred-Pre: 104 %
FEV6-%Change-Post: 0 %
FEV6-%PRED-PRE: 47 %
FEV6-%Pred-Post: 47 %
FEV6-Post: 1.97 L
FEV6-Pre: 1.96 L
FEV6FVC-%Change-Post: 2 %
FEV6FVC-%PRED-PRE: 103 %
FEV6FVC-%Pred-Post: 106 %
FVC-%CHANGE-POST: -1 %
FVC-%Pred-Post: 44 %
FVC-%Pred-Pre: 45 %
FVC-Post: 1.98 L
FVC-Pre: 2.01 L
POST FEV1/FVC RATIO: 82 %
POST FEV6/FVC RATIO: 100 %
Pre FEV1/FVC ratio: 77 %
Pre FEV6/FVC Ratio: 98 %
RV % pred: 103 %
RV: 2.7 L
TLC % pred: 65 %
TLC: 4.72 L

## 2014-06-20 LAB — BLOOD GAS, ARTERIAL
ACID-BASE EXCESS: 2.9 mmol/L — AB (ref 0.0–2.0)
Bicarbonate: 26.3 mEq/L — ABNORMAL HIGH (ref 20.0–24.0)
DRAWN BY: 205171
O2 Content: 2 L/min
O2 SAT: 97.1 %
PO2 ART: 76.3 mmHg — AB (ref 80.0–100.0)
Patient temperature: 98.6
TCO2: 27.4 mmol/L (ref 0–100)
pCO2 arterial: 36.3 mmHg (ref 35.0–45.0)
pH, Arterial: 7.473 — ABNORMAL HIGH (ref 7.350–7.450)

## 2014-06-20 MED ORDER — ALBUTEROL SULFATE (2.5 MG/3ML) 0.083% IN NEBU
2.5000 mg | INHALATION_SOLUTION | Freq: Once | RESPIRATORY_TRACT | Status: AC
  Administered 2014-06-20: 2.5 mg via RESPIRATORY_TRACT

## 2014-06-20 NOTE — Patient Instructions (Signed)
02 should be 24/7 at 2lpm sitting /sleeping/ 5 lpm walking then reduce back to 2lpm when sit  Pace yourself to walking half the pace you did for [place] if you need to walk more than 200 ft.  We will set you up to see Dr Lake Bells on return in 4-6 weeks for evaluation of pulmonary hypertension and hypoxemia

## 2014-06-20 NOTE — Progress Notes (Signed)
Subjective:    Patient ID: [name], male    DOB: 1940/06/18,   MRN: [id number]    Brief patient profile:  69 yowm quit smoking 1983 no resp problems then s/p MAZE [id number] complicated by bloody L effusion s/p tap 1.1 liters 05/10/13 and did well with rehab until mid August 2015 and then  gradually worse doe since  so referred to pulmonary clinic 10/05/2013   by Dr Nahser/ [place]    History of Present Illness  10/05/2013 1st [facility] office visit/ [facility]   Chief Complaint  Patient presents with  . Pulmonary Consult    Referred by Dr. [name] for eval of pleural effusion.  Pt c/o SOB since June 2015- worse for the past 2 wks. He states that he is mainly only SOB with exertion- out of breath walking approx 150 ft.   occ pnd despite cpap better if gets off back and rotates to L side down/ leg swelling post op better with lasix Also now on amiodarone  Doe is variable - sometimes gets up from street to house much better than others but overall activity tol /abilitiy to do rehab steadily worse. Onset was gradual. rec Please remember to go to the lab and x-ray department downstairs for your tests - we will call you with the results when they are available. Pace yourself so you don't push your 02 sats much below 90%  Options for managing the fluid include removing it again vs placing a pleurex catheter in to drain it all permanently - since you have to be off coumadin first for each procedure I would recommend you discuss this with Dr Roxy Manns so you only stop the coumadin once.    06/20/2014 f/u ov/Wert re:  Chief Complaint  Patient presents with  . Follow-up    PFT done today. Pt c/o increased SOB since Feb 2016- progressively getting worse. He states that he is SOB walking from room to room at home. He has been on supplemental o2 for the past 6 wks- 2lpm 24/7.     Body mass index is 26.76 kg/(m^2).   Does ok on 2lpm at rest/ ok hs on cpap/ 2lpm/ does 3-4 liters after gives out  walking x room room, sometimes on 2lpm sometimes on RA No correlation between breathing and ankle swelling and wt / very little correlation between sats and sob.  No obvious day to day or daytime variabilty or assoc chronic cough or cp or chest tightness, subjective wheeze overt sinus or hb symptoms. No unusual exp hx or h/o childhood pna/ asthma or knowledge of premature birth.  Sleeping ok without nocturnal  or early am exacerbation  of respiratory  c/o's or need for noct saba. Also denies any obvious fluctuation of symptoms with weather or environmental changes or other aggravating or alleviating factors except as outlined above   Current Medications, Allergies, Complete Past Medical History, Past Surgical History, Family History, and Social History were reviewed in [employer] record.  ROS  The following are not active complaints unless bolded sore throat, dysphagia, dental problems, itching, sneezing,  nasal congestion or excess/ purulent secretions, ear ache,   fever, chills, sweats, unintended wt loss, pleuritic or exertional cp, hemoptysis,  orthopnea pnd or leg swelling, presyncope, palpitations, heartburn, abdominal pain, anorexia, nausea, vomiting, diarrhea  or change in bowel or urinary habits, change in stools or urine, dysuria,hematuria,  rash, arthralgias, visual complaints, headache, numbness weakness or ataxia or problems with walking or coordination,  change in mood/affect or memory.                               Objective:   Physical Exam  slt hoarse wm nad  06/20/2014        191  Wt Readings from Last 3 Encounters:  10/06/13 198 lb (89.812 kg)  10/05/13 198 lb (89.812 kg)  09/26/13 198 lb (89.812 kg)     HEENT: nl dentition, turbinates, and orophanx. Nl external ear canals without cough reflex   NECK :  without JVD/Nodes/TM/ nl carotid upstrokes bilaterally   LUNGS: no acc muscle use,  Min decreased bs with dulless L > R Base     CV:  RRR  no s3 or murmur or increase in P2, no edema   ABD:  soft and nontender with nl excursion in the supine position. No bruits or organomegaly, bowel sounds nl  MS:  warm without deformities, calf tenderness, cyanosis or clubbing  SKIN: warm and dry without lesions    NEURO:  alert, approp, no deficits             I personally reviewed images and agree with radiology impression as follows:   CT cardiac 05/07/14  1. 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 This anomalous vein drains high into SVC with the long distance between the anomalous vein and RA-SVC junction measuring 6 cm.  This anomalous drain provides drainage from the right upper lung lobe. There is no associated atrial septal defect.  2. Moderately dilated right ventricle, severely dilated right atrium. Dilated pulmonary artery measuring 41.5 x 41.5 mm consistent with pulmonary hypertension.  3. Severe two vessel CAD (LAD, RI). Patent LIMA to LAD and SVG to RI.    Assessment & Plan:

## 2014-06-21 ENCOUNTER — Encounter: Payer: Self-pay | Admitting: Internal Medicine

## 2014-06-21 NOTE — Assessment & Plan Note (Addendum)
05/03/14 RHC  RA = 18 RV = 72/4/17 PA = 76/27 (46) PCW = 21 Fick cardiac output/index (using PA sat) = 9.2/4.45 Thermo CO/CI = 10.0/4.87 PVR = 2.2 WU Fick cardiac output/index (using high SVC sat) = 5.2/2.5 Pulse-ox saturation = 89%  High SVC sat = 54% Low SVC sat = 81%n (at SVC/RA junction) RA sat = 68% RV sat = 66% PA sat = 68%, 69% IVC sat =56%  06/20/14    PFTs  FEV1  1.63(51%) ratio 82 and DLCO 30 corrects to 93% for vol 06/20/2014   Walked 2lpm x 20 ft  stopped due to  desat to 73%, walked one lap fast pace on 5lpm x 185 ft then on second lap dropped again to 70%   I had an extended discussion with the patient reviewing all relevant studies completed to date and  lasting  66 m He appears to have a purely restrictive problem with 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  He has been on amiodarone in the past and has lost a lot of lung volume since surgery but did not have bad ards or obvious interstitial changes on CT to suggest any permanent injury so I don't think an HRCT will help here.  Of note the desats are out of proportion to sob and do favor a R to L shunt being the problem.  The effusions L > R are residual post of and of no significance at this point .  For now would rec slower pacing and always walk on 5 lpm pulsed and see dr Lake Bells to see him in consultaton for hypoxemia.  Each maintenance medication was reviewed in detail including most importantly the difference between maintenance and as needed and under what circumstances the prns are to be used.  Please see instructions for details which were reviewed in writing and the patient given a copy.

## 2014-06-26 ENCOUNTER — Ambulatory Visit (HOSPITAL_COMMUNITY)
Admission: RE | Admit: 2014-06-26 | Discharge: 2014-06-26 | Disposition: A | Discharge status: Home / Self Care | Payer: Medicare Other | Source: Ambulatory Visit | Attending: Physician Assistant | Admitting: Physician Assistant

## 2014-06-26 DIAGNOSIS — K828 Other specified diseases of gallbladder: Secondary | ICD-10-CM | POA: Diagnosis not present

## 2014-06-26 DIAGNOSIS — R188 Other ascites: Secondary | ICD-10-CM | POA: Insufficient documentation

## 2014-06-26 DIAGNOSIS — J90 Pleural effusion, not elsewhere classified: Secondary | ICD-10-CM | POA: Insufficient documentation

## 2014-06-26 DIAGNOSIS — R748 Abnormal levels of other serum enzymes: Secondary | ICD-10-CM

## 2014-07-02 ENCOUNTER — Other Ambulatory Visit: Payer: Medicare Other

## 2014-07-02 DIAGNOSIS — R748 Abnormal levels of other serum enzymes: Secondary | ICD-10-CM

## 2014-07-02 LAB — HEPATIC FUNCTION PANEL
ALBUMIN: 4.4 g/dL (ref 3.5–5.2)
ALT: 22 U/L (ref 0–53)
AST: 20 U/L (ref 0–37)
Alkaline Phosphatase: 190 U/L — ABNORMAL HIGH (ref 39–117)
BILIRUBIN TOTAL: 0.9 mg/dL (ref 0.2–1.2)
Bilirubin, Direct: 0.3 mg/dL (ref 0.0–0.3)
Indirect Bilirubin: 0.6 mg/dL (ref 0.2–1.2)
Total Protein: 7.4 g/dL (ref 6.0–8.3)

## 2014-07-02 LAB — GAMMA GT: GGT: 362 U/L — ABNORMAL HIGH (ref 7–51)

## 2014-07-03 ENCOUNTER — Encounter (HOSPITAL_COMMUNITY): Payer: Medicare Other

## 2014-07-03 ENCOUNTER — Ambulatory Visit (HOSPITAL_COMMUNITY): Payer: Medicare Other

## 2014-07-05 LAB — ALKALINE PHOSPHATASE ISOENZYMES
ALKALINE PHOSPHATASE (ALP ISO): 184 U/L — AB (ref 40–115)
Bone Isoenzymes: 3 % — ABNORMAL LOW (ref 28–66)
INTESTINAL ISOENZYMES (ALP ISO): 6 % (ref 1–24)
Liver Isoenzymes: 73 % — ABNORMAL HIGH (ref 25–69)
Macrohepatic isoenzymes: 18 % — ABNORMAL HIGH

## 2014-07-11 ENCOUNTER — Encounter: Payer: Self-pay | Admitting: Physician Assistant

## 2014-07-11 DIAGNOSIS — I480 Paroxysmal atrial fibrillation: Secondary | ICD-10-CM | POA: Diagnosis not present

## 2014-07-11 DIAGNOSIS — I2510 Atherosclerotic heart disease of native coronary artery without angina pectoris: Secondary | ICD-10-CM | POA: Diagnosis not present

## 2014-07-15 ENCOUNTER — Other Ambulatory Visit: Payer: Self-pay | Admitting: Internal Medicine

## 2014-07-18 ENCOUNTER — Encounter: Payer: Self-pay | Admitting: Physician Assistant

## 2014-07-19 ENCOUNTER — Encounter: Payer: Self-pay | Admitting: Internal Medicine

## 2014-07-19 ENCOUNTER — Ambulatory Visit (INDEPENDENT_AMBULATORY_CARE_PROVIDER_SITE_OTHER): Payer: Medicare Other | Admitting: Internal Medicine

## 2014-07-19 VITALS — BP 96/44 | HR 96 | Temp 97.7°F | Resp 20 | Ht 71.0 in | Wt 204.8 lb

## 2014-07-19 DIAGNOSIS — Z7901 Long term (current) use of anticoagulants: Secondary | ICD-10-CM | POA: Diagnosis not present

## 2014-07-19 DIAGNOSIS — Z79899 Other long term (current) drug therapy: Secondary | ICD-10-CM

## 2014-07-19 DIAGNOSIS — I10 Essential (primary) hypertension: Secondary | ICD-10-CM | POA: Diagnosis not present

## 2014-07-19 DIAGNOSIS — I480 Paroxysmal atrial fibrillation: Secondary | ICD-10-CM | POA: Diagnosis not present

## 2014-07-19 LAB — CBC WITH DIFFERENTIAL/PLATELET
BASOS PCT: 1 % (ref 0–1)
Basophils Absolute: 0.1 10*3/uL (ref 0.0–0.1)
Eosinophils Absolute: 0.1 10*3/uL (ref 0.0–0.7)
Eosinophils Relative: 1 % (ref 0–5)
HCT: 34.1 % — ABNORMAL LOW (ref 39.0–52.0)
HEMOGLOBIN: 10.9 g/dL — AB (ref 13.0–17.0)
LYMPHS ABS: 0.7 10*3/uL (ref 0.7–4.0)
Lymphocytes Relative: 11 % — ABNORMAL LOW (ref 12–46)
MCH: 29.6 pg (ref 26.0–34.0)
MCHC: 32 g/dL (ref 30.0–36.0)
MCV: 92.7 fL (ref 78.0–100.0)
MONOS PCT: 8 % (ref 3–12)
MPV: 8.7 fL (ref 8.6–12.4)
Monocytes Absolute: 0.5 10*3/uL (ref 0.1–1.0)
NEUTROS PCT: 79 % — AB (ref 43–77)
Neutro Abs: 4.8 10*3/uL (ref 1.7–7.7)
PLATELETS: 225 10*3/uL (ref 150–400)
RBC: 3.68 MIL/uL — ABNORMAL LOW (ref 4.22–5.81)
RDW: 15.4 % (ref 11.5–15.5)
WBC: 6.1 10*3/uL (ref 4.0–10.5)

## 2014-07-19 LAB — HEPATIC FUNCTION PANEL
ALBUMIN: 4.2 g/dL (ref 3.5–5.2)
ALT: 44 U/L (ref 0–53)
AST: 38 U/L — AB (ref 0–37)
Alkaline Phosphatase: 221 U/L — ABNORMAL HIGH (ref 39–117)
Bilirubin, Direct: 0.4 mg/dL — ABNORMAL HIGH (ref 0.0–0.3)
Indirect Bilirubin: 0.6 mg/dL (ref 0.2–1.2)
Total Bilirubin: 1 mg/dL (ref 0.2–1.2)
Total Protein: 7.2 g/dL (ref 6.0–8.3)

## 2014-07-19 LAB — BASIC METABOLIC PANEL WITH GFR
BUN: 24 mg/dL — AB (ref 6–23)
CO2: 28 meq/L (ref 19–32)
Calcium: 9.2 mg/dL (ref 8.4–10.5)
Chloride: 104 mEq/L (ref 96–112)
Creat: 1.1 mg/dL (ref 0.50–1.35)
GFR, EST AFRICAN AMERICAN: 76 mL/min
GFR, Est Non African American: 65 mL/min
Glucose, Bld: 159 mg/dL — ABNORMAL HIGH (ref 70–99)
Potassium: 4.1 mEq/L (ref 3.5–5.3)
Sodium: 142 mEq/L (ref 135–145)

## 2014-07-19 NOTE — Progress Notes (Signed)
Patient ID: [name], male   DOB: December 15, 1940, 74 y.o.   MRN: [id number]   This very nice 74 y.o. MWM presents for follow up with Hypertension, ASCAD, HHLD, Pre-Diabetes and Vitamin D Deficiency.    Patient is treated for HTN since the 1980's & BP has been controlled at home. Today's BP: (!) 96/44 mmHg (96/44-left and 96/48 right) and is rechecked sitting at 107/64 and standing at 121/76.     Patient has hx/o ASCAD and p[Afib/flutter & in Apr 2015 underwent CABG & MAZE procedure. In Aug 2015 he had a CV and in Aug a RFA for Aflutter.  Patient is experiencing severe hypoxemia wit O2 sats dropping to the 70's with minimal activity and is currently undergoing evaluations by Cardiology, CVTS and Pulmonary to determine the etiology and co-ordinate best approach for management of his severe hypoxia.    Hyperlipidemia is controlled with diet & meds. Patient denies myalgias or other med SE's. Last Lipids were 06/14/2014: Cholesterol 106; HDL 30*; LDL Cholesterol 62; Triglycerides 70   Also, the patient has history of T2_NIDDM PreDiabetes and has had no symptoms of reactive hypoglycemia, diabetic polys, paresthesias or visual blurring.  Last A1c was 06/14/2014: Hgb A1c MFr Bld 6.2*    Further, the patient also has history of Vitamin D Deficiency and supplements vitamin D without any suspected side-effects. Last vitamin D was 06/14/2014: Vit D, 25-Hydroxy 63    Lab Results  Component Value Date   VD25OH 63 06/14/2014    Medication Sig  . acetaminophen (TYLENOL) 500 MG tablet Take 1,000 mg by mouth every 6 (six) hours as needed for mild pain.   [name] ALPRAZolam (XANAX) 1 MG tablet Take 0.5 mg by mouth at bedtime as needed for sleep.   [name] atorvastatin (LIPITOR) 40 MG tablet Take 20 mg by mouth every Monday, Wednesday, and Friday.   . B Complex Vitamins (VITAMIN B COMPLEX PO) Take 1 tablet by mouth daily.  [name] Besifloxacin HCl 0.6 % SUSP Place 1 drop into the left eye See admin instructions. Use 1.5 days a month  starting the day after get injection into the eye  . carvedilol (COREG) 6.25 MG tablet Take 6.25 mg by mouth 2 (two) times daily with a meal.  . cholecalciferol (VITAMIN D) 1000 UNITS tablet Take 4,000-5,000 Units by mouth See admin instructions. Take 4 tablets four times a week and take 5 tablets three times a week  . cloNIDine (CATAPRES) 0.1 MG tablet Take 0.5 tablets (0.05 mg total) by mouth 2 (two) times daily.  . diphenhydrAMINE (BENADRYL) 25 MG tablet Take 25 mg by mouth at bedtime as needed for allergies.   . furosemide (LASIX) 40 MG tablet Take 2 tablets (80 mg total) by mouth daily. (Patient taking differently: Take 80 mg by mouth See admin instructions. Take 2 tablets (80 mg) every morning, may take 1 additional tablet in the afternoon as needed for ankle swelling)  . glimepiride (AMARYL) 2 MG tablet Take 1 mg by mouth See admin instructions. Take 1/2 tablet daily unless blood sugar is > 120  . glimepiride (AMARYL) 2 MG tablet TAKE 1 TABLET BY MOUTH TWICE A DAY AS DIRECTED  . HYDROcodone-acetaminophen (NORCO/VICODIN) 5-325 MG per tablet Take 1 tablet by mouth every 6 (six) hours as needed for moderate pain. (Patient taking differently: Take 1 tablet by mouth at bedtime as needed for moderate pain. )  . metFORMIN (GLUCOPHAGE-XR) 500 MG 24 hr tablet Take 1,000 mg by mouth 2 (two) times daily.   [name]  minoxidil (LONITEN) 10 MG tablet Take 20 mg by mouth at bedtime.   . Multiple Vitamin (MULTIVITAMIN WITH MINERALS) TABS tablet Take 1 tablet by mouth daily.  . potassium chloride SA (K-DUR,KLOR-CON) 20 MEQ tablet Take 2 tablets (40 mEq total) by mouth daily. (Patient taking differently: Take 20 mEq by mouth 2 (two) times daily. )  . ranitidine (ZANTAC) 150 MG tablet Take 150 mg by mouth daily as needed for heartburn.   . sertraline (ZOLOFT) 100 MG tablet TAKE 1 TABLET BY MOUTH DAILY  . valsartan (DIOVAN) 320 MG tablet Take 1 tablet (320 mg total) by mouth daily.  [name] warfarin (COUMADIN) 5 MG tablet  Take 7.5-10 mg by mouth daily after supper. Take 2 tablets (10 mg) Monday, Wednesday, Friday, take 1 1/2 tablets (7.5 mg) on Sunday, Tuesday, Thursday, Saturday   Allergies  Allergen Reactions  . Sunflower Seed [Sunflower Oil] Swelling and Other (See Comments)    Tongue and lip swelling  . Horse-Derived Products Other (See Comments)    Per allergy skin test  . Tetanus Toxoids Other (See Comments)    Per allergy skin test   PMHx:   Past Medical History  Diagnosis Date  . Hx of adenomatous colonic polyps   . Diabetes mellitus type II   . Hypertension   . Diverticulosis 2001  . Hyperlipidemia   . Persistent atrial fibrillation     a. s/p MAZE 04/2013 in setting of CABG. b. Amio stopped in 10/2013 after flutter ablation.  . Obstructive sleep apnea     compliant with CPAP  . Chronic diastolic congestive heart failure   . Hypertensive cardiomyopathy   . History of cardioversion     x3 (years uncertain)  . H/O hiatal hernia   . Basal cell carcinoma   . Adrenal adenoma   . CAD (coronary artery disease)     a. 04/2013 CABG x 2: LIMA to LAD, SVG to RI, EVH via R thigh.  . S/P Maze operation for atrial fibrillation     a. 04/2013: Complete bilateral atrial lesion set using cryothermy and bipolar radiofrequency ablation with clipping of LA appendage (@ time of CABG)  . GERD (gastroesophageal reflux disease)   . Depression   . Anxiety   . DJD (degenerative joint disease)   . 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 DCCV 08/2013. b. s/p RFA 10/2013.  [name] Respiratory failure     a. Hypoxia 10/2013 - required supp O2 as inpatient, did not require it at discharge.  . Pleural effusion, left     a. s/p thoracentesis 05/2013.  [name] PFO (patent foramen ovale)     a. Small PFO by TEE 10/2013.  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 S/P Maze operation for atrial fibrillation 04/05/2013     Complete bilateral atrial lesion set using cryothermy and bipolar radiofrequency ablation with clipping of LA appendage via median sternotomy approach    Immunization History  Administered Date(s) Administered  . Influenza Split 10/25/2012  . Influenza, High Dose Seasonal PF 09/26/2013  . Pneumococcal Conjugate-13 01/30/2014  . Pneumococcal Polysaccharide-23 10/20/2011  . Td 01/06/2000   Past Surgical History  Procedure Laterality Date  . Polinydal cyst      Removed  . Great toe arthrodesis, interphalangeal joint      Right foot  . Retina repair-right    . Cataract extraction  bilateral  . Basal cell carcinoma excision      x3 on face  . Polypectomy    . Cardiac catheterization      myocardial bridge but no cad  . Coronary artery bypass graft N/A 04/05/2013    Procedure: CORONARY ARTERY BYPASS GRAFTING (CABG) TIMES TWO USING LEFT INTERNAL MAMMARY ARTERY AND RIGHT SAPHENOUS LEG VEIN HARVESTED ENDOSCOPICALLY;  Surgeon: Rexene Alberts, MD;  Location: [facility];  Service: Open Heart Surgery;  Laterality: N/A;  . Maze N/A 04/05/2013    Procedure: MAZE;  Surgeon: Rexene Alberts, MD;  Location: [facility];  Service: Open Heart Surgery;  Laterality: N/A;  . Intraoperative transesophageal echocardiogram N/A 04/05/2013    Procedure: INTRAOPERATIVE TRANSESOPHAGEAL ECHOCARDIOGRAM;  Surgeon: Rexene Alberts, MD;  Location: [facility];  Service: Open Heart Surgery;  Laterality: N/A;  . Tee without cardioversion N/A 08/23/2013    Procedure: TRANSESOPHAGEAL ECHOCARDIOGRAM (TEE);  Surgeon: Sanda Klein, MD;  Location: [facility] [place];  Service: Cardiovascular;  Laterality: N/A;  . Cardioversion N/A 08/23/2013    Procedure: CARDIOVERSION;  Surgeon: Sanda Klein, MD;  Location: [facility] ENDOSCOPY;  Service: Cardiovascular;  Laterality: N/A;  . Tee without cardioversion N/A 10/26/2013    Procedure: TRANSESOPHAGEAL ECHOCARDIOGRAM (TEE);  Surgeon: Sueanne Margarita, MD;  Location: [facility] ENDOSCOPY;  Service: Cardiovascular;   Laterality: N/A;  . Left heart catheterization with coronary angiogram N/A 03/07/2013    Procedure: LEFT HEART CATHETERIZATION WITH CORONARY ANGIOGRAM;  Surgeon: Burnell Blanks, MD;  Location: [facility] CATH LAB;  Service: Cardiovascular;  Laterality: N/A;  . Atrial fibrillation ablation N/A 10/26/2013    Procedure: ATRIAL FIBRILLATION ABLATION;  Surgeon: Coralyn Mark, MD;  Location: [facility] CATH LAB;  Service: Cardiovascular;  Laterality: N/A;  . Carpometacarpel suspension plasty Left 02/14/2014    Procedure: CARPOMETACARPEL (Letts) SUSPENSIONPLASTY THUMB  WITH  ABDUCTOR POLLICIS LONGUS TRANSFER AND STENOSING TENOSYNOVITIS RELEASE LEFT WRIST;  Surgeon: Charlotte Crumb, MD;  Location: [place];  Service: Orthopedics;  Laterality: Left;  . Trapezium resection    . Right heart catheterization N/A 05/03/2014    Procedure: RIGHT HEART CATH;  Surgeon: Jolaine Artist, MD;  Location: [facility] CATH LAB;  Service: Cardiovascular;  Laterality: N/A;  . Tee without cardioversion N/A 06/05/2014    Procedure: TRANSESOPHAGEAL ECHOCARDIOGRAM (TEE);  Surgeon: Thayer Headings, MD;  Location: [facility] ENDOSCOPY;  Service: Cardiovascular;  Laterality: N/A;   FHx:    Reviewed / unchanged  SHx:    Reviewed / unchanged  Systems Review:  Constitutional: Denies fever, chills, wt changes, headaches, insomnia, fatigue, night sweats, change in appetite. Eyes: Denies redness, blurred vision, diplopia, discharge, itchy, watery eyes.  ENT: Denies discharge, congestion, post nasal drip, epistaxis, sore throat, earache, hearing loss, dental pain, tinnitus, vertigo, sinus pain, snoring.  CV: Denies chest pain, palpitations, irregular heartbeat, syncope, dyspnea, diaphoresis, orthopnea, PND, claudication or edema. Respiratory: denies cough,  pleurisy, hoarseness, laryngitis, wheezing. Has DOE and turns O2 up to 5 L/M. Gastrointestinal: Denies dysphagia, odynophagia, heartburn, reflux, water brash, abdominal pain or cramps,  nausea, vomiting, bloating, diarrhea, constipation, hematemesis, melena, hematochezia  or hemorrhoids. Genitourinary: Denies dysuria, frequency, urgency, nocturia, hesitancy, discharge, hematuria or flank pain. Musculoskeletal: Denies arthralgias, myalgias, stiffness, jt. swelling, pain, limping or strain/sprain.  Skin: Denies pruritus, rash, hives, warts, acne, eczema or change in skin lesion(s). Neuro: No weakness, tremor, incoordination, spasms, paresthesia or pain. Psychiatric: Denies confusion, memory loss or sensory loss. Endo: Denies change in weight, skin or hair change.  Heme/Lymph: No excessive bleeding, bruising  or enlarged lymph nodes.  Physical Exam  BP 96/44 by nurse  - rechecked at 107/64 sitting and 121/76 standing      P 96  T 97.7 F  R 20  Ht 5\' 11"    Wt 204 lb 12.8 oz    BMI 28.58   Appears well nourished and in no distress. On Nasal O2 @ 3&1/2 L/M.  Eyes: PERRLA, EOMs, conjunctiva no swelling or erythema. Sinuses: No frontal/maxillary tenderness ENT/Mouth: EAC's clear, TM's nl w/o erythema, bulging. Nares clear w/o erythema, swelling, exudates. Oropharynx clear without erythema or exudates. Oral hygiene is good. Tongue normal, non obstructing. Hearing intact.  Neck: Supple. Thyroid nl. Car 2+/2+ without bruits, nodes or JVD. Chest: Respirations nl with BS clear & equal w/o rales, rhonchi, wheezing or stridor.  Cor: Heart sounds normal w/ regular rate and rhythm without sig. murmurs, gallops, clicks, or rubs. Peripheral pulses normal and equal  With 1 + ankle edema.  Abdomen: Soft & bowel sounds normal. Non-tender w/o guarding, rebound, hernias, masses, or organomegaly.  Lymphatics: Unremarkable.  Musculoskeletal: Full ROM all peripheral extremities, joint stability, 5/5 strength, and normal gait.  Skin: Warm, dry without exposed rashes, lesions or ecchymosis apparent.  Neuro: Cranial nerves intact, reflexes equal bilaterally. Sensory-motor testing grossly intact.  Tendon reflexes grossly intact.  Pysch: Alert & oriented x 3.  Insight and judgement nl & appropriate. No ideations.  Assessment and Plan:  1. Essential hypertension   2. PAF (paroxysmal atrial fibrillation)   3. Chronic anticoagulation  - Protime-INR  4. Medication management  - CBC with Differential/Platelet - BASIC METABOLIC PANEL WITH GFR - Hepatic function panel   Recommended regular exercise, BP monitoring, weight control, and discussed med and SE's. Recommended labs to assess and monitor clinical status. Further disposition pending results of labs. Over 30 minutes of exam, counseling, chart review was performed

## 2014-07-19 NOTE — Patient Instructions (Signed)

## 2014-07-20 ENCOUNTER — Other Ambulatory Visit: Payer: Self-pay | Admitting: Thoracic Surgery (Cardiothoracic Vascular Surgery)

## 2014-07-20 DIAGNOSIS — I480 Paroxysmal atrial fibrillation: Secondary | ICD-10-CM

## 2014-07-20 LAB — PROTIME-INR
INR: 1.72 — ABNORMAL HIGH (ref <1.50)
Prothrombin Time: 20.2 seconds — ABNORMAL HIGH (ref 11.6–15.2)

## 2014-07-23 ENCOUNTER — Ambulatory Visit: Payer: Medicare Other | Admitting: Thoracic Surgery (Cardiothoracic Vascular Surgery)

## 2014-07-24 ENCOUNTER — Ambulatory Visit (INDEPENDENT_AMBULATORY_CARE_PROVIDER_SITE_OTHER)
Admission: RE | Admit: 2014-07-24 | Discharge: 2014-07-24 | Disposition: A | Discharge status: Home / Self Care | Payer: Medicare Other | Source: Ambulatory Visit | Attending: Pulmonary Disease | Admitting: Pulmonary Disease

## 2014-07-24 ENCOUNTER — Ambulatory Visit (INDEPENDENT_AMBULATORY_CARE_PROVIDER_SITE_OTHER): Payer: Medicare Other | Admitting: Pulmonary Disease

## 2014-07-24 ENCOUNTER — Encounter: Payer: Self-pay | Admitting: Pulmonary Disease

## 2014-07-24 VITALS — BP 126/70 | HR 70 | Ht 71.0 in | Wt 205.0 lb

## 2014-07-24 DIAGNOSIS — J9611 Chronic respiratory failure with hypoxia: Secondary | ICD-10-CM | POA: Diagnosis not present

## 2014-07-24 DIAGNOSIS — R0902 Hypoxemia: Secondary | ICD-10-CM

## 2014-07-24 DIAGNOSIS — J90 Pleural effusion, not elsewhere classified: Secondary | ICD-10-CM | POA: Diagnosis not present

## 2014-07-24 DIAGNOSIS — J948 Other specified pleural conditions: Secondary | ICD-10-CM | POA: Diagnosis not present

## 2014-07-24 DIAGNOSIS — I501 Left ventricular failure: Secondary | ICD-10-CM | POA: Diagnosis not present

## 2014-07-24 NOTE — Patient Instructions (Signed)
We will schedule a pulmonary function test as well as a CXR and call you with the results I will call you after we have gone over the results in more detail We will see you back in 3-4 weeks or sooner if needed

## 2014-07-24 NOTE — Assessment & Plan Note (Signed)
Small left sided effusion s/p thoracentesis without studies sent, plan repeat CXR to evaluate today.

## 2014-07-24 NOTE — Assessment & Plan Note (Addendum)
This is a challenging case.  Objectively he has mild restrictive lung disease, no airflow obstruction, profoundly low diffusion capacity. On my review of the images from his CT chest from this year there is no pulmonary parenchymal disease but there is significant mosaicism particularly in the right lung. He has pulmonary hypertension with a normal pulmonary vascular resistance, and elevated pulmonary capillary wedge pressure. He has a dilated right ventricle and is showing signs of right heart failure. When he walks he has significant desaturation.  I can find no convincing evidence that he has a pulmonary parenchymal abnormality at this time despite his lengthy smoking history. He does not have COPD. Despite the mild airflow restriction there is no evidence of pulmonary fibrosis.  I worry that his hypoxemia is due in part to a right to left shunt considering its profound nature. My suspicion is that his pulmonary hypertension worsens on exertion leading to worsening right to left shunt. I am concerned about the possibility of pulmonary venoocclusive disease considering the profound hypoxemia, mild pleural effusion, recent catheter ablation, and the complicating congenital scimitar syndrome.  Plan: Agree with 2 L of oxygen at rest and 5 L on exertion We will order a VQ scan to help sort out the mosaicism seen on CT chest (is this small airways obstruction versus more likely abnormal pulmonary blood flow) and to rule out the remote chance of CTEPH Will order a traditional shunt study through the respiratory department Given my concern for pulmonary vaso-occlusive disease I would be reluctant to try pulmonary vasodilator set this time as this could worsen his hypoxemia Would recommend referral to Dr. Rich Reining and Dr. Christy Sartorius Test at [facility]

## 2014-07-24 NOTE — Progress Notes (Signed)
Subjective:    Patient ID: [name], male    DOB: [birth year], 74 y.o.   MRN: [id number]  HPI Chief Complaint  Patient presents with  . Follow-up    Pt c/o PND, sob.  Denies any cough or chest tightness. Per MW evaluate for pulm htn.     This is a pleasant 74 year old male has come to my office today for a second opinion on an etiology of hypoxemic respiratory failure. He has been experiencing worsening dyspnea since March 2016 but his history goes back much further than that. The child he had no respiratory illnesses or no known lung illnesses throughout his life. He smoked 3-4 packs of cigarettes daily when he quit smoking in 1983 after smoking for 24 years. However, in 2006 he was diagnosed with "a myocardial bridge" after a left heart catheterization in the setting of some chest pain. Over the years after that he developed atrial fibrillation. In April 2015 he underwent a MAZE procedure for atrial fibrillation. In September 2015 he was found to be back in a flutter so he underwent cardioversion. Unfortunately he was back in a flutter again in October 2015 so he underwent an ablation on 10/27/2014.  The following procedures were performed:  PROCEDURES: 1. Comprehensive electrophysiologic study. 2. Coronary sinus pacing and recording. 3. Three-dimensional mapping of atrial fibrillation with additional mapping and ablation of a second discrete focus (left atrial flutter) 4. Ablation of atrial fibrillation with additional mapping and ablation of a second discrete focus (left atrial flutter) 5. Intracardiac echocardiography. 6. Transseptal puncture of an intact septum. 7. Rotational Angiography with processing at an independent workstation 8. Arrhythmia induction with pacing.    In February 2016 he underwent Left thumb suspension plasty using APL tendon transfer and two 4 x 10 mm Bio-Tenodesis screws as well as de Quervain release, and tenosynovectomy, APL and APB. This was performed under  local/block anesthesia.  In March 2016 he started developing dyspnea. He says that prior to that he only had some minor shortness of breath on exertion. However the symptoms started somewhat abruptly and have been progressive and worsening since then. He says now he can only walk 50 feet before becoming short of breath. During this time he has become progressively more hypoxemic. He is currently using 2 L of oxygen at rest and per the advice of my partner after his last visit he is using 5 L on exertion. He says that this helps but he still has worsening dyspnea. He has also undergone thoracentesis for left-sided pleural effusion.  This dyspnea has been associated with leg swelling. He has been taking diuretics. He also has significant orthopnea. He denies chest pain or weight loss.  In March 2016 he had a CT angiogram of the chest which showed no pulmonary embolism, a small left pleural effusion, and an adrenal adenoma. My review those images show significant mosaicism particularly in the right lung.  In 2015 prior to having his maze procedure he had lung function testing which showed a ratio of 78%, FEV1 of 2.49 L (77% predicted), FVC 2.98 L (67% predicted), total lung capacity 5.52 L (76% predicted), DLCO 34.61 (102% predicted). In June 2016 he had lung function testing that showed a ratio of 82%, FEV1 1.63 L (51% predicted), FVC 1.98 L (44% predicted), total lung capacity 4.72 L (65% predicted), DLCO 10.20 (30% predicted)  In August 2015 he had an echocardiogram which showed normal LV size and function, a small patent foramen ovale, normal RV  size and function. PA pressure estimate on that study was 35 mmHg.  He had a right heart catheterization in April 2016 which showed a mean pulmonary artery pressure 46, pulmonary capillary wedge pressure 21, cardiac output 9.2, cardiac index 4.5, pulmonary vascular resistance only 2.2 Woods units. An oxygen step up was noted between the "high SVC" low SVC/RA  junction".  A May 2016 CT scan of the coronary and cardiac anatomy showed a right upper pulmonary vein which drained to the high SVC. There is not felt to be an atrial septal defect. He had moderately dilated RV, severely dilated right a trim. There is also severe 2 vessel coronary artery disease.  A transthoracic echocardiogram was performed in 03/2014 which showed a, severely dilated right atrium and severely dilated right ventricle.  Shortly thereafter a transesophageal echocardiogram was performed which confirmed the presence of patent foramen ovale, moderate RV dilatation and mild RV systolic function decreased. Normal LV function in both studies. Both studies were suggestive of pulmonary hypertension.     Past Medical History  Diagnosis Date  . Hx of adenomatous colonic polyps   . Diabetes mellitus type II   . Hypertension   . Diverticulosis 2001  . Hyperlipidemia   . Persistent atrial fibrillation     a. s/p MAZE 04/2013 in setting of CABG. b. Amio stopped in 10/2013 after flutter ablation.  . Obstructive sleep apnea     compliant with CPAP  . Chronic diastolic congestive heart failure   . Hypertensive cardiomyopathy   . History of cardioversion     x3 (years uncertain)  . H/O hiatal hernia   . Basal cell carcinoma   . Adrenal adenoma   . CAD (coronary artery disease)     a. 04/2013 CABG x 2: LIMA to LAD, SVG to RI, EVH via R thigh.  . S/P Maze operation for atrial fibrillation     a. 04/2013: Complete bilateral atrial lesion set using cryothermy and bipolar radiofrequency ablation with clipping of LA appendage (@ time of CABG)  . GERD (gastroesophageal reflux disease)   . Depression   . Anxiety   . DJD (degenerative joint disease)   . 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 DCCV 08/2013. b. s/p RFA 10/2013.  [name] Respiratory failure     a. Hypoxia 10/2013 - required supp O2 as inpatient, did not require it at discharge.  . Pleural effusion, left     a. s/p thoracentesis  05/2013.  [name] PFO (patent foramen ovale)     a. Small PFO by TEE 10/2013.  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 S/P Maze operation for atrial fibrillation 04/05/2013    Complete bilateral atrial lesion set using cryothermy and bipolar radiofrequency ablation with clipping of LA appendage via median sternotomy approach      Family History  Problem Relation Age of Onset  . Colon cancer Mother     Family History/Uncle   . Colon polyps Mother     Family History  . Atrial fibrillation Mother   . Hypertension Mother   . Colon polyps Sister     Family history  . Diabetes Maternal Uncle   . Stroke Paternal Uncle   . Dementia Father      History   Social History  . Marital Status: Married    Spouse Name: N/A  . Number of Children: 1  . Years  of Education: N/A   Occupational History  . retired Software engineer    Social History Main Topics  . Smoking status: Former Smoker -- 4.00 packs/day for 25 years    Types: Cigarettes    Quit date: 01/05/1981  . Smokeless tobacco: Never Used  . Alcohol Use: Yes     Comment: 1-5 drinks per week  . Drug Use: No  . Sexual Activity: Not on file   Other Topics Concern  . Not on file   Social History Narrative   Daily caffeine-yes   Patient gets regular exercise.   Pt lives in [place] with spouse.  Retired Software engineer.  Allergies  Allergen Reactions  . Sunflower Seed [Sunflower Oil] Swelling and Other (See Comments)    Tongue and lip swelling  . Horse-Derived Products Other (See Comments)    Per allergy skin test  . Tetanus Toxoids Other (See Comments)    Per allergy skin test     Outpatient Prescriptions Prior to Visit  Medication Sig Dispense Refill  . acetaminophen (TYLENOL) 500 MG tablet Take 1,000 mg by mouth every 6 (six) hours as needed for mild pain.     [name] ALPRAZolam (XANAX) 1 MG tablet Take 0.5 mg by mouth at bedtime as needed for sleep.     [name] atorvastatin (LIPITOR) 40 MG tablet Take 20 mg by mouth every Monday, Wednesday, and Friday.     . B Complex Vitamins (VITAMIN B COMPLEX PO) Take 1 tablet by mouth daily.    [name] Besifloxacin HCl 0.6 % SUSP Place 1 drop into the left eye See admin instructions. Use 1.5 days a month starting the day after get injection into the eye    . carvedilol (COREG) 6.25 MG tablet Take 6.25 mg by mouth 2 (two) times daily with a meal.    . cholecalciferol (VITAMIN D) 1000 UNITS tablet Take 4,000-5,000 Units by mouth See admin instructions. Take 4 tablets four times a week and take 5 tablets three times a week    . diphenhydrAMINE (BENADRYL) 25 MG tablet Take 25 mg by mouth at bedtime as needed for allergies.     . furosemide (LASIX) 40 MG tablet Take 2 tablets (80 mg total) by mouth daily. (Patient taking differently:  Take 80 mg by mouth See admin instructions. Take 2 tablets (80 mg) every morning, may take 1 additional tablet in the afternoon as needed for ankle swelling) 60 tablet 6  . glimepiride (AMARYL) 2 MG tablet Take 1 mg by mouth See admin instructions. Take 1/2 tablet daily unless blood sugar is > 120    . glimepiride (AMARYL) 2 MG tablet TAKE 1 TABLET BY MOUTH TWICE A DAY AS DIRECTED 180 tablet 1  . HYDROcodone-acetaminophen (NORCO/VICODIN) 5-325 MG per tablet Take 1 tablet by mouth every 6 (six) hours as needed for moderate pain. (Patient taking differently: Take 1 tablet by mouth at bedtime as needed for moderate pain. ) 30 tablet   . metFORMIN (GLUCOPHAGE-XR) 500 MG 24 hr tablet Take 1,000 mg by mouth 2 (two) times daily.     . minoxidil (LONITEN) 10 MG tablet Take 20 mg by mouth at bedtime.     . Multiple Vitamin (MULTIVITAMIN WITH MINERALS) TABS tablet Take 1 tablet by mouth daily.    . potassium chloride SA (K-DUR,KLOR-CON) 20 MEQ tablet Take 2 tablets (40 mEq total) by mouth daily. (Patient taking differently: Take 20 mEq by mouth 2 (two) times daily. ) 30 tablet 6  . ranitidine (ZANTAC) 150 MG tablet Take 150 mg by mouth daily as needed for heartburn.     . sertraline (ZOLOFT) 100 MG tablet TAKE 1 TABLET BY MOUTH DAILY 30 tablet 11  . valsartan (DIOVAN) 320 MG tablet Take 1 tablet (320 mg total) by mouth daily. 90 tablet 3  . warfarin (COUMADIN) 5 MG tablet Take 7.5-10 mg by mouth daily after supper. Take 2 tablets (10 mg) Monday, Wednesday, Friday, take 1 1/2 tablets (7.5 mg) on Sunday, Tuesday, Thursday, Saturday    . cloNIDine (CATAPRES) 0.1 MG tablet Take 0.5 tablets (0.05 mg total) by mouth 2 (two) times daily. (Patient not taking: Reported on 07/24/2014) 30 tablet  6   No facility-administered medications prior to visit.       Review of Systems  Constitutional: Positive for fatigue. Negative for fever, chills, activity change and appetite change.  HENT: Negative for congestion, ear  pain, hearing loss, postnasal drip, rhinorrhea, sinus pressure and sneezing.   Eyes: Negative for redness, itching and visual disturbance.  Respiratory: Positive for chest tightness and shortness of breath. Negative for cough and wheezing.   Cardiovascular: Positive for leg swelling. Negative for chest pain and palpitations.  Gastrointestinal: Negative for nausea, vomiting, abdominal pain, diarrhea, constipation, blood in stool and abdominal distention.  Musculoskeletal: Negative for myalgias, joint swelling, arthralgias, gait problem, neck pain and neck stiffness.  Skin: Negative for rash.  Neurological: Negative for dizziness, light-headedness, numbness and headaches.  Hematological: Does not bruise/bleed easily.  Psychiatric/Behavioral: Negative for confusion and dysphoric mood.       Objective:   Physical Exam Filed Vitals:   07/24/14 1406  BP: 126/70  Pulse: 70  Height: 5\' 11"  (1.803 m)  Weight: 205 lb (92.987 kg)  SpO2: 93%   2L [place]  Gen: well appearing, no acute distress HENT: NCAT, OP clear, neck supple without masses Eyes: PERRL, EOMi Lymph: no cervical lymphadenopathy PULM: Crackles left base> r base, good air movement, no wheezing CV: RRR, fixed split S2, no JVD GI: BS+, soft, nontender, no hsm Derm: no rash or skin breakdown MSK: normal bulk and tone Neuro: A&Ox4, CN II-XII intact, strength 5/5 in all 4 extremities Psyche: normal mood and affect   Recent lab work reviewed including a mild anemia (hemoglobin 10.6), normal kidney function and an elevated alkaline phosphatase.       Assessment & Plan:  Chronic respiratory failure This is a challenging case.  Objectively he has mild restrictive lung disease, no airflow obstruction, profoundly low diffusion capacity. On my review of the images from his CT chest from this year there is no pulmonary parenchymal disease but there is significant mosaicism particularly in the right lung. He has pulmonary hypertension  with a normal pulmonary vascular resistance, and elevated pulmonary capillary wedge pressure. He has a dilated right ventricle and is showing signs of right heart failure. When he walks he has significant desaturation.  I can find no convincing evidence that he has a pulmonary parenchymal abnormality at this time despite his lengthy smoking history. He does not have COPD. Despite the mild airflow restriction there is no evidence of pulmonary fibrosis.  I worry that his hypoxemia is due in part to a right to left shunt considering its profound nature. My suspicion is that his pulmonary hypertension worsens on exertion leading to worsening right to left shunt. I am concerned about the possibility of pulmonary venoocclusive disease considering the profound hypoxemia, mild pleural effusion, recent catheter ablation, and the complicating congenital scimitar syndrome.  Plan: Agree with 2 L of oxygen at rest and 5 L on exertion We will order a VQ scan to help sort out the mosaicism seen on CT chest (is this small airways obstruction versus more likely abnormal pulmonary blood flow) and to rule out the remote chance of CTEPH Will order a traditional shunt study through the respiratory department Given my concern for pulmonary vaso-occlusive disease I would be reluctant to try pulmonary vasodilator set this time as this could worsen his hypoxemia Would recommend referral to Dr. Rich Reining and Dr. Christy Sartorius Test at [facility]  Pleural effusion on left Small left sided effusion s/p thoracentesis without studies sent, plan  repeat CXR to evaluate today.     Current outpatient prescriptions:  .  acetaminophen (TYLENOL) 500 MG tablet, Take 1,000 mg by mouth every 6 (six) hours as needed for mild pain. , Disp: , Rfl:  .  ALPRAZolam (XANAX) 1 MG tablet, Take 0.5 mg by mouth at bedtime as needed for sleep. , Disp: , Rfl:  .  atorvastatin (LIPITOR) 40 MG tablet, Take 20 mg by mouth every  Monday, Wednesday, and Friday. , Disp: , Rfl:  .  B Complex Vitamins (VITAMIN B COMPLEX PO), Take 1 tablet by mouth daily., Disp: , Rfl:  .  Besifloxacin HCl 0.6 % SUSP, Place 1 drop into the left eye See admin instructions. Use 1.5 days a month starting the day after get injection into the eye, Disp: , Rfl:  .  carvedilol (COREG) 6.25 MG tablet, Take 6.25 mg by mouth 2 (two) times daily with a meal., Disp: , Rfl:  .  cholecalciferol (VITAMIN D) 1000 UNITS tablet, Take 4,000-5,000 Units by mouth See admin instructions. Take 4 tablets four times a week and take 5 tablets three times a week, Disp: , Rfl:  .  diphenhydrAMINE (BENADRYL) 25 MG tablet, Take 25 mg by mouth at bedtime as needed for allergies. , Disp: , Rfl:  .  furosemide (LASIX) 40 MG tablet, Take 2 tablets (80 mg total) by mouth daily. (Patient taking differently: Take 80 mg by mouth See admin instructions. Take 2 tablets (80 mg) every morning, may take 1 additional tablet in the afternoon as needed for ankle swelling), Disp: 60 tablet, Rfl: 6 .  glimepiride (AMARYL) 2 MG tablet, Take 1 mg by mouth See admin instructions. Take 1/2 tablet daily unless blood sugar is > 120, Disp: , Rfl:  .  glimepiride (AMARYL) 2 MG tablet, TAKE 1 TABLET BY MOUTH TWICE A DAY AS DIRECTED, Disp: 180 tablet, Rfl: 1 .  HYDROcodone-acetaminophen (NORCO/VICODIN) 5-325 MG per tablet, Take 1 tablet by mouth every 6 (six) hours as needed for moderate pain. (Patient taking differently: Take 1 tablet by mouth at bedtime as needed for moderate pain. ), Disp: 30 tablet, Rfl:  .  metFORMIN (GLUCOPHAGE-XR) 500 MG 24 hr tablet, Take 1,000 mg by mouth 2 (two) times daily. , Disp: , Rfl:  .  minoxidil (LONITEN) 10 MG tablet, Take 20 mg by mouth at bedtime. , Disp: , Rfl:  .  Multiple Vitamin (MULTIVITAMIN WITH MINERALS) TABS tablet, Take 1 tablet by mouth daily., Disp: , Rfl:  .  potassium chloride SA (K-DUR,KLOR-CON) 20 MEQ tablet, Take 2 tablets (40 mEq total) by mouth daily.  (Patient taking differently: Take 20 mEq by mouth 2 (two) times daily. ), Disp: 30 tablet, Rfl: 6 .  ranitidine (ZANTAC) 150 MG tablet, Take 150 mg by mouth daily as needed for heartburn. , Disp: , Rfl:  .  sertraline (ZOLOFT) 100 MG tablet, TAKE 1 TABLET BY MOUTH DAILY, Disp: 30 tablet, Rfl: 11 .  valsartan (DIOVAN) 320 MG tablet, Take 1 tablet (320 mg total) by mouth daily., Disp: 90 tablet, Rfl: 3 .  warfarin (COUMADIN) 5 MG tablet, Take 7.5-10 mg by mouth daily after supper. Take 2 tablets (10 mg) Monday, Wednesday, Friday, take 1 1/2 tablets (7.5 mg) on Sunday, Tuesday, Thursday, Saturday, Disp: , Rfl:

## 2014-07-25 ENCOUNTER — Telehealth: Payer: Self-pay

## 2014-07-25 DIAGNOSIS — J90 Pleural effusion, not elsewhere classified: Secondary | ICD-10-CM

## 2014-07-25 NOTE — Telephone Encounter (Signed)
Order placed. Nothing further needed. 

## 2014-07-25 NOTE — Telephone Encounter (Signed)
-----   Message from Juanito Doom, MD sent at 07/24/2014  5:48 PM EDT ----- A, He needs a shunt study through respiratory:  ABG on Room air Placed on 100% FiO2 for 10 minutes Then repeat ABG on 100% FiO2 Thanks Ruby Cola

## 2014-07-25 NOTE — Addendum Note (Signed)
Addended by: Len Blalock on: 07/25/2014 04:26 PM   Modules accepted: Orders

## 2014-07-25 NOTE — Progress Notes (Signed)
Quick Note:  Called and spoke to pt. Reviewed results and recs. Pt voiced understanding and had no further questions. ______ 

## 2014-07-25 NOTE — Addendum Note (Signed)
Addended by: Len Blalock on: 07/25/2014 04:02 PM   Modules accepted: Orders

## 2014-07-25 NOTE — Addendum Note (Signed)
Addended by: Len Blalock on: 07/25/2014 09:16 AM   Modules accepted: Orders

## 2014-07-27 ENCOUNTER — Ambulatory Visit (HOSPITAL_COMMUNITY)
Admission: RE | Admit: 2014-07-27 | Discharge: 2014-07-27 | Disposition: A | Discharge status: Home / Self Care | Payer: Medicare Other | Source: Ambulatory Visit | Attending: Pulmonary Disease | Admitting: Pulmonary Disease

## 2014-07-27 ENCOUNTER — Ambulatory Visit (HOSPITAL_COMMUNITY)
Admission: RE | Admit: 2014-07-27 | Discharge: 2014-07-27 | Disposition: A | Discharge status: Home / Self Care | Payer: Medicare Other | Source: Ambulatory Visit | Attending: Thoracic Surgery (Cardiothoracic Vascular Surgery) | Admitting: Thoracic Surgery (Cardiothoracic Vascular Surgery)

## 2014-07-27 DIAGNOSIS — I509 Heart failure, unspecified: Secondary | ICD-10-CM | POA: Insufficient documentation

## 2014-07-27 DIAGNOSIS — J90 Pleural effusion, not elsewhere classified: Secondary | ICD-10-CM

## 2014-07-27 DIAGNOSIS — I517 Cardiomegaly: Secondary | ICD-10-CM | POA: Diagnosis not present

## 2014-07-27 DIAGNOSIS — R0602 Shortness of breath: Secondary | ICD-10-CM | POA: Diagnosis not present

## 2014-07-27 DIAGNOSIS — Z951 Presence of aortocoronary bypass graft: Secondary | ICD-10-CM | POA: Insufficient documentation

## 2014-07-27 DIAGNOSIS — J811 Chronic pulmonary edema: Secondary | ICD-10-CM | POA: Diagnosis not present

## 2014-07-27 DIAGNOSIS — I272 Other secondary pulmonary hypertension: Secondary | ICD-10-CM | POA: Diagnosis not present

## 2014-07-27 DIAGNOSIS — J9611 Chronic respiratory failure with hypoxia: Secondary | ICD-10-CM | POA: Insufficient documentation

## 2014-07-27 DIAGNOSIS — I480 Paroxysmal atrial fibrillation: Secondary | ICD-10-CM

## 2014-07-27 MED ORDER — TECHNETIUM TO 99M ALBUMIN AGGREGATED: 6.3000 | Freq: Once | INTRAVENOUS | Status: AC | PRN

## 2014-07-27 MED ORDER — TECHNETIUM TC 99M DIETHYLENETRIAME-PENTAACETIC ACID: 43.8000 | Freq: Once | INTRAVENOUS | Status: AC | PRN

## 2014-07-30 ENCOUNTER — Ambulatory Visit (HOSPITAL_COMMUNITY)
Admission: RE | Admit: 2014-07-30 | Discharge: 2014-07-30 | Disposition: A | Discharge status: Home / Self Care | Payer: Medicare Other | Source: Ambulatory Visit | Attending: Pulmonary Disease | Admitting: Pulmonary Disease

## 2014-07-30 DIAGNOSIS — J90 Pleural effusion, not elsewhere classified: Secondary | ICD-10-CM | POA: Diagnosis not present

## 2014-07-30 DIAGNOSIS — J9611 Chronic respiratory failure with hypoxia: Secondary | ICD-10-CM | POA: Diagnosis not present

## 2014-07-30 DIAGNOSIS — I509 Heart failure, unspecified: Secondary | ICD-10-CM | POA: Diagnosis not present

## 2014-07-30 DIAGNOSIS — R0902 Hypoxemia: Secondary | ICD-10-CM

## 2014-07-30 DIAGNOSIS — I517 Cardiomegaly: Secondary | ICD-10-CM | POA: Diagnosis not present

## 2014-07-30 LAB — PULMONARY FUNCTION TEST
DL/VA % pred: 75 %
DL/VA: 3.53 ml/min/mmHg/L
DLCO UNC % PRED: 31 %
DLCO UNC: 10.55 ml/min/mmHg
FEF 25-75 PRE: 1.5 L/s
FEF 25-75 Post: 1.8 L/sec
FEF2575-%CHANGE-POST: 19 %
FEF2575-%PRED-POST: 78 %
FEF2575-%Pred-Pre: 65 %
FEV1-%Change-Post: 2 %
FEV1-%PRED-PRE: 45 %
FEV1-%Pred-Post: 46 %
FEV1-PRE: 1.45 L
FEV1-Post: 1.49 L
FEV1FVC-%Change-Post: 1 %
FEV1FVC-%PRED-PRE: 112 %
FEV6-%CHANGE-POST: 1 %
FEV6-%PRED-POST: 42 %
FEV6-%Pred-Pre: 41 %
FEV6-PRE: 1.72 L
FEV6-Post: 1.74 L
FEV6FVC-%CHANGE-POST: 2 %
FEV6FVC-%PRED-POST: 106 %
FEV6FVC-%PRED-PRE: 103 %
FVC-%Change-Post: 1 %
FVC-%Pred-Post: 40 %
FVC-%Pred-Pre: 40 %
FVC-POST: 1.8 L
FVC-Pre: 1.77 L
POST FEV6/FVC RATIO: 100 %
PRE FEV6/FVC RATIO: 97 %
Post FEV1/FVC ratio: 83 %
Pre FEV1/FVC ratio: 82 %
RV % pred: 59 %
RV: 1.55 L
TLC % PRED: 46 %
TLC: 3.36 L

## 2014-07-30 MED ORDER — ALBUTEROL SULFATE (2.5 MG/3ML) 0.083% IN NEBU
2.5000 mg | INHALATION_SOLUTION | Freq: Once | RESPIRATORY_TRACT | Status: AC
Start: 2014-07-30 — End: 2014-07-30
  Administered 2014-07-30: 2.5 mg via RESPIRATORY_TRACT

## 2014-07-31 ENCOUNTER — Ambulatory Visit: Payer: Medicare Other | Admitting: Thoracic Surgery (Cardiothoracic Vascular Surgery)

## 2014-08-02 ENCOUNTER — Ambulatory Visit (INDEPENDENT_AMBULATORY_CARE_PROVIDER_SITE_OTHER): Payer: Medicare Other | Admitting: Physician Assistant

## 2014-08-02 ENCOUNTER — Telehealth: Payer: Self-pay | Admitting: Pulmonary Disease

## 2014-08-02 ENCOUNTER — Encounter: Payer: Self-pay | Admitting: Physician Assistant

## 2014-08-02 VITALS — BP 98/44 | HR 84 | Ht 71.0 in | Wt 205.4 lb

## 2014-08-02 DIAGNOSIS — I50810 Right heart failure, unspecified: Secondary | ICD-10-CM

## 2014-08-02 DIAGNOSIS — R140 Abdominal distension (gaseous): Secondary | ICD-10-CM

## 2014-08-02 DIAGNOSIS — I509 Heart failure, unspecified: Secondary | ICD-10-CM | POA: Diagnosis not present

## 2014-08-02 DIAGNOSIS — R194 Change in bowel habit: Secondary | ICD-10-CM

## 2014-08-02 DIAGNOSIS — I272 Pulmonary hypertension, unspecified: Secondary | ICD-10-CM

## 2014-08-02 DIAGNOSIS — I270 Primary pulmonary hypertension: Secondary | ICD-10-CM | POA: Diagnosis not present

## 2014-08-02 DIAGNOSIS — R1012 Left upper quadrant pain: Secondary | ICD-10-CM

## 2014-08-02 DIAGNOSIS — R1011 Right upper quadrant pain: Secondary | ICD-10-CM | POA: Diagnosis not present

## 2014-08-02 NOTE — Progress Notes (Signed)
Reviewed and agree with management. Lotta Frankenfield D. Gauri Galvao, M.D., FACG  

## 2014-08-02 NOTE — Telephone Encounter (Signed)
I called Mr. [name] today to go over the results of his pulmonary function testing which did not show a significant change from last month's test. Further, his nuclear medicine scan did not show a significant lack of air or blood flow to the right upper lobe.  Based on the complexity of his problem with pulmonary hypertension and anomalous venous return think that it would be wise for him to breathe for her to a tertiary care center like to [facility] for a second evaluation. I have discussed this case at length with his cardiologist, his cardiothoracic surgeon, and his other pulmonologist and all are in agreement.  We will refer him to Dr. Rich Reining to [facility] Cardiology/Pulmonary Hypertension clinic.

## 2014-08-02 NOTE — Progress Notes (Signed)
Patient ID: [name], male   DOB: [birth year], 74 y.o.   MRN: [id number]   Subjective:    Patient ID: [name], male    DOB: [birth year], 74 y.o.   MRN: [id number]  HPI History is a pleasant 74 year old white male known previously to Dr. Deatra Ina from prior procedures who is referred today by primary care for evaluation of an elevated alkaline phosphatase level. Patient had labs done in June 2016 LFTs were normal with the exception of an alkaline phosphatase at 163, labs were repeated on 714 and alkaline phosphatase up to 221 GGT of 362 AST 38 a LT of 44 and total bilirubin of 0.4. Upper abdominal ultrasound was done and shows no gallstones there is some gallbladder wall thickening common bile duct 3 mm,normal appearing liver , and there is mild ascites. Patient has history of coronary artery disease, chronic anticoagulation with Coumadin with history of atrial fib and atrial flutter previously he is status post CABG and maze procedure April 2015, has known congestive heart failure and hypertensive cardiomyopathy. He has recently undergone extensive pulmonary evaluation with Dr. Lake Bells  For progressive hypoxemic respiratory failure. Patient has had worsening of symptoms since March 2016. This is felt secondary to pulmonary hypertension. He has a severely dilated right ventricle and is showing signs of progressive right heart failure. He has oxygen desaturation with walking. He is currently O2 dependent with 2 L at rest and 5 L with exertion. There is concern that his hypoxemia is due in part to a right to left shunt and the possibility of pulmonary venoocclusive disease is being can centered. Further workup is in progress he is to have a shunt study done,VQ scan and is to be referred to [facility] to Dr. Rich Reining. From a GI perspective patient says he did develop abdominal chin a few months ago which has been persistent. He says he does not feel that this is worsened over the past few weeks  but has been fairly constant. He says he feels full and bloated all the time and hasn't been able to eat as much. He also has had some constipation though has to make frequent trips to the bathroom feeling urgency but then just passes small amounts of stool at a time.  Review of Systems Pertinent positive and negative review of systems were noted in the above HPI section.  All other review of systems was otherwise negative.  Outpatient Encounter Prescriptions as of 08/02/2014  Medication Sig  . ALPRAZolam (XANAX) 1 MG tablet Take 0.5 mg by mouth at bedtime as needed for sleep.   [name] atorvastatin (LIPITOR) 40 MG tablet Take 20 mg by mouth every Monday, Wednesday, and Friday.   . B Complex Vitamins (VITAMIN B COMPLEX PO) Take 1 tablet by mouth daily.  [name] Besifloxacin HCl 0.6 % SUSP Place 1 drop into the left eye See admin instructions. Use 1.5 days a month starting the day after get injection into the eye  . carvedilol (COREG) 6.25 MG tablet Take 6.25 mg by mouth 2 (two) times daily with a meal.  . cholecalciferol (VITAMIN D) 1000 UNITS tablet Take 4,000-5,000 Units by mouth See admin instructions. Take 4 tablets four times a week and take 5 tablets three times a week  . diphenhydrAMINE (BENADRYL) 25 MG tablet Take 25 mg by mouth at bedtime as needed for allergies.   . furosemide (LASIX) 40 MG tablet Take 2 tablets (80 mg total) by mouth daily. (Patient taking differently: Take 80 mg  by mouth See admin instructions. Take 2 tablets (80 mg) every morning, may take 1 additional tablet in the afternoon as needed for ankle swelling)  . glimepiride (AMARYL) 2 MG tablet Take 1 mg by mouth See admin instructions. Take 1/2 tablet daily unless blood sugar is > 120  . HYDROcodone-acetaminophen (NORCO/VICODIN) 5-325 MG per tablet Take 1 tablet by mouth every 6 (six) hours as needed for moderate pain. (Patient taking differently: Take 1 tablet by mouth at bedtime as needed for moderate pain. )  . metFORMIN  (GLUCOPHAGE-XR) 500 MG 24 hr tablet Take 1,000 mg by mouth 2 (two) times daily.   . minoxidil (LONITEN) 10 MG tablet Take 20 mg by mouth at bedtime.   . Multiple Vitamin (MULTIVITAMIN WITH MINERALS) TABS tablet Take 1 tablet by mouth daily.  . OXYGEN Inhale 2 L into the lungs continuous.  . potassium chloride SA (K-DUR,KLOR-CON) 20 MEQ tablet Take 2 tablets (40 mEq total) by mouth daily. (Patient taking differently: Take 20 mEq by mouth 2 (two) times daily. )  . ranitidine (ZANTAC) 150 MG tablet Take 150 mg by mouth daily as needed for heartburn.   . sertraline (ZOLOFT) 100 MG tablet TAKE 1 TABLET BY MOUTH DAILY  . valsartan (DIOVAN) 320 MG tablet Take 1 tablet (320 mg total) by mouth daily.  [name] warfarin (COUMADIN) 5 MG tablet Take 7.5-10 mg by mouth daily after supper. Take 2 tablets (10 mg) Monday, Wednesday, Friday, take 1 1/2 tablets (7.5 mg) on Sunday, Tuesday, Thursday, Saturday  . [DISCONTINUED] acetaminophen (TYLENOL) 500 MG tablet Take 1,000 mg by mouth every 6 (six) hours as needed for mild pain.   . [DISCONTINUED] glimepiride (AMARYL) 2 MG tablet TAKE 1 TABLET BY MOUTH TWICE A DAY AS DIRECTED   No facility-administered encounter medications on file as of 08/02/2014.   Allergies  Allergen Reactions  . Sunflower Seed [Sunflower Oil] Swelling and Other (See Comments)    Tongue and lip swelling  . Horse-Derived Products Other (See Comments)    Per allergy skin test  . Tetanus Toxoids Other (See Comments)    Per allergy skin test   Patient Active Problem List   Diagnosis Date Noted  . Pleural effusion 07/24/2014  . Medicare annual wellness visit, subsequent 07/19/2014  . 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 Depression, controlled 01/30/2014  . OSA on CPAP 01/30/2014  . PFO (patent foramen ovale) 10/30/2013  . Anemia 10/30/2013  . CAD (coronary artery disease) 10/29/2013  . Chronic respiratory failure 10/27/2013  . HTN  (hypertension) 10/25/2013  . Chronic anticoagulation 08/22/2013  . Acute on chronic diastolic CHF (congestive heart failure), NYHA class 3 08/21/2013  . Atypical atrial flutter s/p RFA 10/26/13 08/21/2013  . Vitamin D deficiency 05/19/2013  . Medication management 05/19/2013  . Pleural effusion on left 05/15/2013  . 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 GERD   . Anxiety   . DJD (degenerative joint disease)   . 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 S/P Maze operation for atrial fibrillation 04/05/2013  . Chronic diastolic congestive heart failure   . Hypertensive cardiomyopathy   . Hyperlipidemia 09/03/2008  . PAF (paroxysmal atrial fibrillation) 09/03/2008  . DIVERTICULOSIS-COLON 09/03/2008  . Colonic Polyps 09/03/2008   History   Social History  . Marital Status: Married    Spouse Name: N/A  . Number of Children: 1  . Years of Education: N/A   Occupational History  .  retired Software engineer    Social History Main Topics  . Smoking status: Former Smoker -- 4.00 packs/day for 25 years    Types: Cigarettes    Quit date: 01/05/1981  . Smokeless tobacco: Never Used  . Alcohol Use: Yes     Comment: 1-5 drinks per week  . Drug Use: No  . Sexual Activity: Not on file   Other Topics Concern  . Not on file   Social History Narrative   Daily caffeine-yes   Patient gets regular exercise.   Pt lives in [place] with spouse.  Retired Software engineer.  Mr. Fidalgo family history includes Atrial fibrillation in his mother; Colon cancer in his mother; Colon polyps in his mother and sister; Dementia in his father; Diabetes in his maternal uncle; Hypertension in his mother; Stroke in his paternal uncle.      Objective:    Filed Vitals:   08/02/14 0859  BP: 98/44  Pulse: 84    Physical Exam  well-developed elderly white male in no acute distress, he is on portable oxygen, pleasant blood pressure 98/44 pulse 84 height 5 foot 11 weight 205. HEENT; nontraumatic normocephalic EOMI PERRLA sclera anicteric, neckSupple, no JVD; Cardiovascular; regular rate and rhythm with S1-S2, soft murmur, Pulmonary ;with decreased breath sounds bilateral bases, Abdomen ;protuberant though non-tense ascites no palpable hepatosplenomegaly, nontender bowel sounds are present, Rectal exam not done, Extremities ;trace edema bilateral ankles, Neuropsych ;mood and affect appropriate       Assessment & Plan:   #1 74 yo WM with elevated alk phos, and GGT in setting of progressing right heart failure and pulm HTN. Workup in progress with concern for right to left shunt, and pulmonary vaso-occlusive disease  Cholestatic pattern of  elevation of liver enzymes is seen with right heart failure #2 PFO #3 respiratory failure- o2 dependent #4 CAD s/p CABG #5 PAF-s/p maze #6NIDDM  #7pleiral effusions #8 abdominal distention  secondary to ascites  Plan; will pursue CT abdomen/pelvis with contrast to r/o other intra-abdominal pathology-cirrhosis etc complicated the the situation  He will be best served by referral to [place], and  any treatment that will improve his progressive right heart failure   We discussed paracentesis if needed for comfort in the near future - understanding that the fluid will reaccumulate     Amy S Esterwood PA-C 08/02/2014   Cc: Vicie Mutters, PA-C

## 2014-08-02 NOTE — Telephone Encounter (Signed)
-----   Message from Alfredia Ferguson, PA-C sent at 08/02/2014  1:22 PM EDT ----- Regarding: referral to [facility] Dr. Lake Bells- I saw thisvery nice  pt today in consult for GI- referred to [place] by PCP for elevated alkphos and GGT which are very likely on the basis of right heart failure.  I see from your extensive evaluation that he is being referred to [place] says he had not been told and hasn't heard about an appointment- just wanted you to know so his referral can be expedited!...  Thanks, Amy Esterwood PA-C/[facility]

## 2014-08-02 NOTE — Patient Instructions (Signed)
  You have been scheduled for a CT scan of the abdomen and pelvis at [facility] ([address]---this is in the same building as [facility]).   You are scheduled on Wednesday 08-08-2014 at 9:30 am. You should arrive at 9:15 to your appointment time for registration. Please follow the written instructions below on the day of your exam:  WARNING: IF YOU ARE ALLERGIC TO IODINE/X-RAY DYE, PLEASE NOTIFY RADIOLOGY IMMEDIATELY AT [phone]! YOU WILL BE GIVEN A 13 HOUR PREMEDICATION PREP.  1) Do not eat or drink anything after 5:30 am (4 hours prior to your test) 2) You have been given 2 bottles of oral contrast to drink. The solution may taste better if refrigerated, but do NOT add ice or any other liquid to this solution. Shake  well before drinking.    Drink 1 bottle of contrast @ 7:30 am  (2 hours prior to your exam)  Drink 1 bottle of contrast @ 8:30 am (1 hour prior to your exam)  You may take any medications as prescribed with a small amount of water except for the following: Metformin, Glucophage, Glucovance, Avandamet, Riomet, Fortamet, Actoplus Met, Janumet, Glumetza or Metaglip. The above medications must be held the day of the exam AND 48 hours after the exam.  The purpose of you drinking the oral contrast is to aid in the visualization of your intestinal tract. The contrast solution may cause some diarrhea. Before your exam is started, you will be given a small amount of fluid to drink. Depending on your individual set of symptoms, you may also receive an intravenous injection of x-ray contrast/dye. Plan on being at [facility] for 30 minutes or long, depending on the type of exam you are having performed.  If you have any questions regarding your exam or if you need to reschedule, you may call the CT department at [phone] between the hours of 8:00 am and 5:00 pm, Monday-Friday.  ________________________________________________________________________

## 2014-08-02 NOTE — Telephone Encounter (Signed)
[phone], pt cb

## 2014-08-02 NOTE — Progress Notes (Signed)
Reviewed and agree with management. Kishan Wachsmuth D. Maryfer Tauzin, M.D., FACG  

## 2014-08-03 NOTE — Addendum Note (Signed)
Addended by: Len Blalock on: 08/03/2014 11:08 AM   Modules accepted: Orders

## 2014-08-03 NOTE — Telephone Encounter (Signed)
Order placed.  Dr. Lake Bells do I need to make the pt aware of this referral, or did you discuss this with him already?  Thanks!

## 2014-08-05 NOTE — Telephone Encounter (Signed)
I discussed this with him

## 2014-08-08 ENCOUNTER — Ambulatory Visit (INDEPENDENT_AMBULATORY_CARE_PROVIDER_SITE_OTHER)
Admission: RE | Admit: 2014-08-08 | Discharge: 2014-08-08 | Disposition: A | Discharge status: Home / Self Care | Payer: Medicare Other | Source: Ambulatory Visit | Attending: Physician Assistant | Admitting: Physician Assistant

## 2014-08-08 DIAGNOSIS — I270 Primary pulmonary hypertension: Secondary | ICD-10-CM

## 2014-08-08 DIAGNOSIS — R194 Change in bowel habit: Secondary | ICD-10-CM

## 2014-08-08 DIAGNOSIS — K5900 Constipation, unspecified: Secondary | ICD-10-CM | POA: Diagnosis not present

## 2014-08-08 DIAGNOSIS — R1011 Right upper quadrant pain: Secondary | ICD-10-CM | POA: Diagnosis not present

## 2014-08-08 DIAGNOSIS — K5730 Diverticulosis of large intestine without perforation or abscess without bleeding: Secondary | ICD-10-CM | POA: Diagnosis not present

## 2014-08-08 DIAGNOSIS — I509 Heart failure, unspecified: Secondary | ICD-10-CM | POA: Diagnosis not present

## 2014-08-08 DIAGNOSIS — I50810 Right heart failure, unspecified: Secondary | ICD-10-CM

## 2014-08-08 DIAGNOSIS — R140 Abdominal distension (gaseous): Secondary | ICD-10-CM

## 2014-08-08 DIAGNOSIS — I272 Pulmonary hypertension, unspecified: Secondary | ICD-10-CM

## 2014-08-08 DIAGNOSIS — R1012 Left upper quadrant pain: Secondary | ICD-10-CM

## 2014-08-08 MED ORDER — IOHEXOL 300 MG/ML  SOLN
100.0000 mL | Freq: Once | INTRAMUSCULAR | Status: AC | PRN
  Administered 2014-08-08: 100 mL via INTRAVENOUS

## 2014-08-09 ENCOUNTER — Telehealth: Payer: Self-pay | Admitting: Physician Assistant

## 2014-08-10 ENCOUNTER — Telehealth: Payer: Self-pay | Admitting: Cardiovascular Disease

## 2014-08-10 DIAGNOSIS — I272 Pulmonary hypertension, unspecified: Secondary | ICD-10-CM

## 2014-08-10 DIAGNOSIS — I421 Obstructive hypertrophic cardiomyopathy: Secondary | ICD-10-CM

## 2014-08-10 NOTE — Telephone Encounter (Signed)
Ok- unfortunately he is very complicated, and also anticoagulated with Coumadin. His ascites is from right heart failure- I am not comfortable just sending him for a large volume paracentesis  He needs to be seen by cardiologist -Dr. Cathie Olden early next week - please call them and get him worked in . Pt may need to go to ER at [facility] over weekend if gets worse or more SOB   Please fax copies of my note and Dr Rayetta Pigg notes to Cardiologist

## 2014-08-10 NOTE — Telephone Encounter (Signed)
Message left for the nurse at Cardiology

## 2014-08-10 NOTE — Telephone Encounter (Signed)
See note from [place] with patient who states he is having SOB and difficulty taking a deep breath.  He states he is able to sleep pretty well due to the CPAP machine but he is uncomfortable.  I asked if he feels he needs immediate medical care and he denies.  He is scheduled to go to [facility] to see a pulmonary hypertension specialist but soonest appointment is 9/1.  He states he would appreciate any relief Dr. Acie Fredrickson can offer.  He is currently taking Lasix 80 mg BID.  He states he has some mild lower extremity swelling.  I advised him I will send message to Dr. Acie Fredrickson for advice.  Patient verbalized understanding and agreement.

## 2014-08-10 NOTE — Telephone Encounter (Signed)
New message      Pt is complaining of sob, cannot lie down flat, fluid build up in abdomen.  He sees Dr Acie Fredrickson and is seen by Roderic Palau.  [place] thinks pt needs to be seen sooner than his 8-18 appt but not sure if we should see him or AFIB clinic.  Please call

## 2014-08-10 NOTE — Telephone Encounter (Signed)
Spoke with patient and reviewed Dr. Elmarie Shiley advice.  He states he would like to try Lasix 80 mg TID through the weekend.  I advised him to call me Monday to report how he is feeling and we will schedule lab appointment for bmet at that time.  Patient verbalized understanding and agreement.

## 2014-08-10 NOTE — Telephone Encounter (Signed)
He feels he is to the point that he needs a paracentesis done. He cannot lean forward and talk, lying down in certain positions is uncomfortable and his belly button is poking out now. Please advise.

## 2014-08-10 NOTE — Telephone Encounter (Signed)
Patient was contacted by Cardiology. He states he will be increasing his "fluid pill".

## 2014-08-10 NOTE — Telephone Encounter (Signed)
If the lasix is still working , My may try lasix 80 TID and see if that helps. If he is not getting any significant diuresis with the lasix , we can try Demedex 40 mg BID or 50 mg BID  (  1/2 of a 100 mg tablet BID - TID  We can check BMP in a week after he is on the higher dose to make sure his potassium is ok Unfortunately, I do not have any other suggestions.  I do not think paracentesis would be of any significant long term help.  I think it would recur very quickly and could also cause significant hypotension ( has his intravascular volume shifts to that extra vascular space )

## 2014-08-11 DIAGNOSIS — I480 Paroxysmal atrial fibrillation: Secondary | ICD-10-CM | POA: Diagnosis not present

## 2014-08-11 DIAGNOSIS — I2510 Atherosclerotic heart disease of native coronary artery without angina pectoris: Secondary | ICD-10-CM | POA: Diagnosis not present

## 2014-08-12 ENCOUNTER — Other Ambulatory Visit: Payer: Self-pay | Admitting: Cardiovascular Disease

## 2014-08-13 MED ORDER — TORSEMIDE 100 MG PO TABS: 50.0000 mg | ORAL_TABLET | Freq: Two times a day (BID) | ORAL | Status: DC

## 2014-08-13 MED ORDER — MINOXIDIL 10 MG PO TABS: 20.0000 mg | ORAL_TABLET | Freq: Every day | ORAL | Status: DC

## 2014-08-13 MED ORDER — METOLAZONE 5 MG PO TABS: ORAL_TABLET | ORAL | Status: DC

## 2014-08-13 NOTE — Telephone Encounter (Signed)
Spoke with patient who reports he is not feeling better and has gained 2 lbs over the weekend.  I advised that Dr. Acie Fredrickson wants patient to take Demadex 50 mg twice daily.  He asked if he should take any Lasix.  I advised him that I will discuss with Dr. Acie Fredrickson and call him back with his advice.   I spoke with Dr. Acie Fredrickson and he advised patient take Demadex 50 mg twice daily, d/c Lasix and add Metolazone 5 mg every 3 days.  He advised patient continue to monitor daily weight and to call Wednesday to report if he is feeling better.  He will need a bmet in 1 week.    I called and reviewed advice with patient who verbalized understanding and agreement.  He agrees to call back Wednesday.

## 2014-08-14 ENCOUNTER — Encounter (INDEPENDENT_AMBULATORY_CARE_PROVIDER_SITE_OTHER): Payer: Medicare Other | Admitting: Ophthalmology

## 2014-08-14 DIAGNOSIS — H338 Other retinal detachments: Secondary | ICD-10-CM | POA: Diagnosis not present

## 2014-08-14 DIAGNOSIS — H34832 Tributary (branch) retinal vein occlusion, left eye: Secondary | ICD-10-CM | POA: Diagnosis not present

## 2014-08-14 DIAGNOSIS — H43813 Vitreous degeneration, bilateral: Secondary | ICD-10-CM

## 2014-08-14 DIAGNOSIS — I10 Essential (primary) hypertension: Secondary | ICD-10-CM

## 2014-08-14 DIAGNOSIS — H35033 Hypertensive retinopathy, bilateral: Secondary | ICD-10-CM | POA: Diagnosis not present

## 2014-08-14 DIAGNOSIS — H35371 Puckering of macula, right eye: Secondary | ICD-10-CM | POA: Diagnosis not present

## 2014-08-15 NOTE — Telephone Encounter (Signed)
Called patient to check on how he is feeling.  He states he is feeling better; reports weight loss of 4 lbs, less pain in his upper abdomen and using less oxygen because he feels that he can breath better.  He is scheduled for a bmet Tuesday 8/16 to check kidney function and electrolytes.  I advised him to continue current therapy and to call if he has questions or concerns.  He verbalized understanding and agreement and thanked me for the call.

## 2014-08-20 ENCOUNTER — Encounter: Payer: Self-pay | Admitting: Physician Assistant

## 2014-08-20 ENCOUNTER — Ambulatory Visit (INDEPENDENT_AMBULATORY_CARE_PROVIDER_SITE_OTHER): Payer: Medicare Other | Admitting: Physician Assistant

## 2014-08-20 VITALS — BP 118/58 | HR 80 | Temp 98.1°F | Resp 18 | Ht 71.0 in | Wt 194.0 lb

## 2014-08-20 DIAGNOSIS — Z79899 Other long term (current) drug therapy: Secondary | ICD-10-CM | POA: Diagnosis not present

## 2014-08-20 DIAGNOSIS — I480 Paroxysmal atrial fibrillation: Secondary | ICD-10-CM | POA: Diagnosis not present

## 2014-08-20 DIAGNOSIS — I4891 Unspecified atrial fibrillation: Secondary | ICD-10-CM | POA: Diagnosis not present

## 2014-08-20 NOTE — Progress Notes (Signed)
Coumadin follow up  Patient is on Coumadin for Primary Diagnosis: PAF (paroxysmal atrial fibrillation) [I48.0] Patient's last INR is  Lab Results  Component Value Date   INR 1.72* 07/19/2014   INR 1.82* 06/14/2014   INR 1.9* 05/01/2014    Patient denies SOB, CP, dizziness, nose bleeds, easy bleeding, and blood in stool/urine. His coumadin dose was changed last visit, he is on 10mg  every day at this time.  He has not taken ABX, has not missed any doses and denies a fall.   He also has complicated heart history with history of CABG and MAZE in 2010, failed DCCV with refractory flutter. Has been evaluated and followed closely for refractory dyspnea and hypoxemia. He has had some edema and ascites, rather than have paracentesis, his lasix was switched to toresmide 50mg  BID and zaroxyln 5mg  TID. He has lost 14 lbs and states he is able to breath better and using less oxygen. Denies leg cramps, dizziness, SOB, CP. Has follow up with [facility] in Sept. Follows Dr. Sondra Come Thursday.      Current Outpatient Prescriptions on File Prior to Visit  Medication Sig Dispense Refill  . ALPRAZolam (XANAX) 1 MG tablet Take 0.5 mg by mouth at bedtime as needed for sleep.     [name] atorvastatin (LIPITOR) 40 MG tablet Take 20 mg by mouth every Monday, Wednesday, and Friday.     . B Complex Vitamins (VITAMIN B COMPLEX PO) Take 1 tablet by mouth daily.    [name] Besifloxacin HCl 0.6 % SUSP Place 1 drop into the left eye See admin instructions. Use 1.5 days a month starting the day after get injection into the eye    . carvedilol (COREG) 6.25 MG tablet Take 6.25 mg by mouth 2 (two) times daily with a meal.    . cholecalciferol (VITAMIN D) 1000 UNITS tablet Take 4,000-5,000 Units by mouth See admin instructions. Take 4 tablets four times a week and take 5 tablets three times a week    . diphenhydrAMINE (BENADRYL) 25 MG tablet Take 25 mg by mouth at bedtime as needed for allergies.     [name] glimepiride (AMARYL) 2 MG tablet Take 1 mg by  mouth See admin instructions. Take 1/2 tablet daily unless blood sugar is > 120    . HYDROcodone-acetaminophen (NORCO/VICODIN) 5-325 MG per tablet Take 1 tablet by mouth every 6 (six) hours as needed for moderate pain. (Patient taking differently: Take 1 tablet by mouth at bedtime as needed for moderate pain. ) 30 tablet   . metFORMIN (GLUCOPHAGE-XR) 500 MG 24 hr tablet Take 1,000 mg by mouth 2 (two) times daily.     . metolazone (ZAROXOLYN) 5 MG tablet Take 1 pill every 3 days 30 minutes before Demadex 20 tablet 11  . minoxidil (LONITEN) 10 MG tablet Take 2 tablets (20 mg total) by mouth at bedtime. 90 tablet 3  . Multiple Vitamin (MULTIVITAMIN WITH MINERALS) TABS tablet Take 1 tablet by mouth daily.    . OXYGEN Inhale 2 L into the lungs continuous.    . potassium chloride SA (K-DUR,KLOR-CON) 20 MEQ tablet Take 2 tablets (40 mEq total) by mouth daily. (Patient taking differently: Take 20 mEq by mouth 2 (two) times daily. ) 30 tablet 6  . ranitidine (ZANTAC) 150 MG tablet Take 150 mg by mouth daily as needed for heartburn.     . sertraline (ZOLOFT) 100 MG tablet TAKE 1 TABLET BY MOUTH DAILY 30 tablet 11  . torsemide (DEMADEX) 100 MG tablet Take 0.5  tablets (50 mg total) by mouth 2 (two) times daily. 31 tablet 11  . valsartan (DIOVAN) 320 MG tablet Take 1 tablet (320 mg total) by mouth daily. 90 tablet 3  . warfarin (COUMADIN) 5 MG tablet Take 7.5-10 mg by mouth daily after supper. Take 2 tablets (10 mg) Monday, Wednesday, Friday, take 1 1/2 tablets (7.5 mg) on Sunday, Tuesday, Thursday, Saturday     No current facility-administered medications on file prior to visit.   Past Medical History  Diagnosis Date  . Hx of adenomatous colonic polyps   . Diabetes mellitus type II   . Hypertension   . Diverticulosis 2001  . Hyperlipidemia   . Persistent atrial fibrillation     a. s/p MAZE 04/2013 in setting of CABG. b. Amio stopped in 10/2013 after flutter ablation.  . Obstructive sleep apnea      compliant with CPAP  . Chronic diastolic congestive heart failure   . Hypertensive cardiomyopathy   . History of cardioversion     x3 (years uncertain)  . H/O hiatal hernia   . Basal cell carcinoma   . Adrenal adenoma   . CAD (coronary artery disease)     a. 04/2013 CABG x 2: LIMA to LAD, SVG to RI, EVH via R thigh.  . S/P Maze operation for atrial fibrillation     a. 04/2013: Complete bilateral atrial lesion set using cryothermy and bipolar radiofrequency ablation with clipping of LA appendage (@ time of CABG)  . GERD (gastroesophageal reflux disease)   . Depression   . Anxiety   . DJD (degenerative joint disease)   . 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 DCCV 08/2013. b. s/p RFA 10/2013.  [name] Respiratory failure     a. Hypoxia 10/2013 - required supp O2 as inpatient, did not require it at discharge.  . Pleural effusion, left     a. s/p thoracentesis 05/2013.  [name] PFO (patent foramen ovale)     a. Small PFO by TEE 10/2013.  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 S/P Maze operation for atrial fibrillation 04/05/2013    Complete bilateral atrial lesion set using cryothermy and bipolar radiofrequency ablation with clipping of LA appendage via median sternotomy approach    Allergies  Allergen Reactions  . Sunflower Seed [Sunflower Oil] Swelling and Other (See Comments)    Tongue and lip swelling  . Horse-Derived Products Other (See Comments)    Per allergy skin test  . Tetanus Toxoids Other (See Comments)    Per allergy skin test    Review of Systems  Constitutional: Positive for malaise/fatigue. Negative for fever and chills.  HENT: Negative.   Respiratory: Positive for shortness of breath (better). Negative for cough and wheezing.   Cardiovascular: Negative for chest pain, orthopnea, leg swelling (improved) and PND.  Gastrointestinal: Negative.   Genitourinary:  Positive for frequency. Negative for dysuria, urgency, hematuria and flank pain.  Musculoskeletal: Negative.   Skin: Negative.   Neurological: Negative for dizziness, tingling, tremors, sensory change, speech change, focal weakness, seizures, loss of consciousness and weakness.  Psychiatric/Behavioral: Negative.  Negative for depression and suicidal ideas.     Physical: Blood pressure 118/58, pulse 80, temperature 98.1 F (36.7 C), resp. rate 18, height 5\' 11"  (1.803 m), weight 194 lb (87.998 kg). Filed Weights   08/20/14 1027  Weight: 194 lb (87.998 kg)    General Appearance: Well  nourished, in no apparent distress. ENT/Mouth: Nares clear with no erythema, swelling, mucus on turbinates. No ulcers, cracking, on lips. No erythema, swelling, or exudate on post pharynx.  Neck: Supple, thyroid normal.  Respiratory: [place] in place, lungs with some diffuse decreased air movement but CTAB   Cardio:  Irregular, irregular rhythm, with holosystolic murmur with prominent S2, rubs or gallops. No edema  Abdomen: distened and firm to palpation, with bowl sounds. Non tender, no guarding, rebound, hernias, masses, or organomegaly.  Skin: Warm, dry without rashes, lesions, ecchymosis.  Neuro: Unremarkable  Assessment and plan: Chronic anticoagulation- check INR and will adjust medication according to labs.  Discussed if patient falls to immediately contact office or go to ER. Discussed foods that can increase or decrease Coumadin levels. Patient understands to call the office before starting a new medication. Follow up in one month.   Dyspnea- still with firm AB and likely ascites but has improved symptomatically with increase in diuretics, will check BMP/electrolytes and send to Dr. Sondra Come.

## 2014-08-21 ENCOUNTER — Other Ambulatory Visit: Payer: Self-pay

## 2014-08-21 ENCOUNTER — Telehealth: Payer: Self-pay | Admitting: Cardiovascular Disease

## 2014-08-21 LAB — HEPATIC FUNCTION PANEL
ALT: 29 U/L (ref 9–46)
AST: 27 U/L (ref 10–35)
Albumin: 4.3 g/dL (ref 3.6–5.1)
Alkaline Phosphatase: 219 U/L — ABNORMAL HIGH (ref 40–115)
BILIRUBIN INDIRECT: 0.6 mg/dL (ref 0.2–1.2)
Bilirubin, Direct: 0.3 mg/dL — ABNORMAL HIGH (ref <=0.2)
TOTAL PROTEIN: 7.6 g/dL (ref 6.1–8.1)
Total Bilirubin: 0.9 mg/dL (ref 0.2–1.2)

## 2014-08-21 LAB — BASIC METABOLIC PANEL WITH GFR
BUN: 59 mg/dL — ABNORMAL HIGH (ref 7–25)
CALCIUM: 9.6 mg/dL (ref 8.6–10.3)
CHLORIDE: 93 mmol/L — AB (ref 98–110)
CO2: 36 mmol/L — AB (ref 20–31)
Creat: 1.97 mg/dL — ABNORMAL HIGH (ref 0.70–1.18)
GFR, Est African American: 37 mL/min — ABNORMAL LOW (ref >=60)
GFR, Est Non African American: 32 mL/min — ABNORMAL LOW (ref >=60)
Glucose, Bld: 156 mg/dL — ABNORMAL HIGH (ref 65–99)
Potassium: 3.3 mmol/L — ABNORMAL LOW (ref 3.5–5.3)
Sodium: 142 mmol/L (ref 135–146)

## 2014-08-21 LAB — PROTIME-INR
INR: 2.24 — ABNORMAL HIGH (ref <1.50)
PROTHROMBIN TIME: 24.8 s — AB (ref 11.6–15.2)

## 2014-08-21 LAB — CBC WITH DIFFERENTIAL/PLATELET
Basophils Absolute: 0.1 10*3/uL (ref 0.0–0.1)
Basophils Relative: 1 % (ref 0–1)
Eosinophils Absolute: 0.1 10*3/uL (ref 0.0–0.7)
Eosinophils Relative: 2 % (ref 0–5)
HCT: 32.9 % — ABNORMAL LOW (ref 39.0–52.0)
HEMOGLOBIN: 10.4 g/dL — AB (ref 13.0–17.0)
LYMPHS PCT: 11 % — AB (ref 12–46)
Lymphs Abs: 0.8 10*3/uL (ref 0.7–4.0)
MCH: 29 pg (ref 26.0–34.0)
MCHC: 31.6 g/dL (ref 30.0–36.0)
MCV: 91.6 fL (ref 78.0–100.0)
MONOS PCT: 9 % (ref 3–12)
MPV: 8.8 fL (ref 8.6–12.4)
Monocytes Absolute: 0.6 10*3/uL (ref 0.1–1.0)
NEUTROS ABS: 5.5 10*3/uL (ref 1.7–7.7)
Neutrophils Relative %: 77 % (ref 43–77)
Platelets: 233 10*3/uL (ref 150–400)
RBC: 3.59 MIL/uL — ABNORMAL LOW (ref 4.22–5.81)
RDW: 16.1 % — ABNORMAL HIGH (ref 11.5–15.5)
WBC: 7.2 10*3/uL (ref 4.0–10.5)

## 2014-08-21 LAB — MAGNESIUM: Magnesium: 2.3 mg/dL (ref 1.5–2.5)

## 2014-08-21 NOTE — Telephone Encounter (Signed)
I spoke with [name] and told him he did not need blood work today (done yesterday at primary care).  [name] reports message was sent to him from primary care via my chart with instructions based on results of lab work. He has appt with Dr. Acie Fredrickson on Thursday in [place] office and follow up lab work to be done at that appt.

## 2014-08-21 NOTE — Telephone Encounter (Signed)
New message    Patient calling had lab work done at primary on yesterday. Does he need to come to this appt.

## 2014-08-23 ENCOUNTER — Encounter: Payer: Self-pay | Admitting: Cardiovascular Disease

## 2014-08-23 ENCOUNTER — Ambulatory Visit (INDEPENDENT_AMBULATORY_CARE_PROVIDER_SITE_OTHER): Payer: Medicare Other | Admitting: Cardiovascular Disease

## 2014-08-23 VITALS — BP 85/45 | HR 63 | Ht 71.0 in | Wt 197.5 lb

## 2014-08-23 DIAGNOSIS — I159 Secondary hypertension, unspecified: Secondary | ICD-10-CM | POA: Diagnosis not present

## 2014-08-23 DIAGNOSIS — I272 Pulmonary hypertension, unspecified: Secondary | ICD-10-CM | POA: Insufficient documentation

## 2014-08-23 DIAGNOSIS — I270 Primary pulmonary hypertension: Secondary | ICD-10-CM

## 2014-08-23 DIAGNOSIS — Q263 Partial anomalous pulmonary venous connection: Secondary | ICD-10-CM

## 2014-08-23 MED ORDER — TORSEMIDE 100 MG PO TABS: 50.0000 mg | ORAL_TABLET | Freq: Every day | ORAL | Status: DC

## 2014-08-23 MED ORDER — POTASSIUM CHLORIDE CRYS ER 20 MEQ PO TBCR: 20.0000 meq | EXTENDED_RELEASE_TABLET | Freq: Every day | ORAL | Status: DC

## 2014-08-23 NOTE — Patient Instructions (Signed)
Medication Instructions:  Your physician has recommended you make the following change in your medication:  DECREASE  Potassium to 43mEq once per day DECREASE torsemide to 50mg  once per day   Labwork: Your physician recommends that you have labs today: BMET   Testing/Procedures: none  Follow-Up: Your physician recommends that you schedule a follow-up appointment in: three months with Dr. Acie Fredrickson   Any Other Special Instructions Will Be Listed Below (If Applicable).

## 2014-08-23 NOTE — Progress Notes (Signed)
Cardiology Office Note    Date:  08/23/2014   ID:  [name], DOB 04-14-1940, MRN [id number]  PCP:  Cory Richards, MD  Cardiologist:   Cory Headings, MD   Chief Complaint  Patient presents with  . other    3 month follow up. Meds reviewed by the patient verbally. "doing well."    Problem List 1. Hypertension 2. HOCM 3. Aortic insuffuciency 4. Atrial fibrillation-failed Tikosyn, Has seen [facility] in [place], [place] .  5. Diabetes mellitus 6. 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 He has a hyperdynamic LV with systolic anterior motion of the mitral valve leaflet. 8. CABG and MAZE 04-05-13 9. Anomalous pulmonary venous return  History of Present Illness:  [name] is a 74 year old gentleman with a history of hypertension, atrial fibrillation and a myocardial bridge. He is a previous patient of Dr. Susa [name]. He also has a history of diabetes. He has felt fairly well. He has not had any episodes of chest pain or shortness of breath.   Nov. 25, 2013: He is very symptomatic with his Atrial fib. He gets very winded with walking. He has been on Eliquis and tikosyn 500 BID. He had an episode of severe dyspnea while at his farm in [place]. He was diagnosed with panic attack. Enzymes were normal. He is ready for his cardioversion.  Nov. 27, 2013: [name] was seen 2 days ago and was found to have atrial fibrillation. He was scheduled for cardioversion. When he showed up for cardioversion yesterday, he was in normal sinus rhythm.  He related to me that he had some medication changes over the past several weeks. He was previously on Tikosyn and citalopram and his atrial fibrillation seemed to be well-controlled on that. He's retired Software engineer and noted that the citalopram and Tikosyn interacted so his medical doctor changed him to Wellbutrin and stopped the citalopram. It was at that time that he started having increased palpitations, anxiety, and we noted  him to be in atrial fibrillation. He then discontinued the Wellbutrin and was started on Zoloft. He seems to be tolerating this combination. It was at that time that he showed up in the short stay Center and was found to have already converted to sinus rhythm.  May 31, 2012:  [name] is doing well. Still in NSR. No leg edema. He sticks to a low sodium diet. He has occasional CP - typically associated with arm / chest movement.   He wants to stop coumadin - interferes with his arthritis medications. He has maintained NSR since his last visit.   Nov. 24, 2014:  [name] is doing OK. He has some brief palps - he thinks they are PVCs. No sustained episodes of Afib. He has been getting Avastin injections into his eyes.   Jan. 12, 2015:  [name] is seen today as a work in visit for worsening ankle edema. He has not been able to associate his symptoms with change of diet. . he has also been noticing some increasing shortness of breath. He notes short or shortness of breath with any activity-example take the garbage cans out. He denies any PND. He uses a CPAP mask.   Jan. 26, 2015:  [name] is seen back today . He had CABG and MAZE on April 05, 2013 - slow recovery following surgery Had a left pleural tap ~ 4 weeks after surgery - 1.2 liters taken off - did much better after that  Had elective cardioversioin in early August, 2015.  [name] was seen 2 weeks ago with symptoms consistent with congestive heart failure. He had been in persistent atrial fibrillation and so his tikosyn was stopped. He is still in atrial fibrillation. His rate is well controlled.   He had an echocardiogram: Left ventricle: Wall thickness was increased in a pattern of moderate LVH. Systolic function was vigorous. The estimated ejection fraction was in the range of 75% to 80%. Wall motion was normal; there were no regional wall motion abnormalities. - Aortic valve: Trivial regurgitation. - Left atrium:  The atrium was mildly to moderately dilated. - Right ventricle: The cavity size was mildly dilated. Systolic function was mildly reduced. - Right atrium: The atrium was moderately dilated. - Pulmonary arteries: Systolic pressure was mildly increased. PA peak pressure: 73mm Hg  He was found to have mild pulmonary hypertension.  He continues to have some dyspnea. He has ankle edema. His BP log shows mildly elevated BP.  May 26, 2013: [name] has had 2 V CABG and MAZE (April 05, 2013) since I last saw him. . He has done well He is having some tenderness along the right thigh SVG harvest site. Also c/o left 4th and 5th finger tingling / numbness.   He has otherwise done well. No cardiac complaints.   August 30, 2013:  He was cardioverted several weeks ago. After the cardioversion, his metoprolol was doubled to 25 BID, and diltizem was added   Oct. 2, 2015:  He has gone back into atrial fib. He has known left pleural effusion. He has had more dyspnea. Saw [name] yesterday.  He is more lethargic. Has hypoxemia ( O2 sat of 88% with walking 50 feet)   Dec. 14, 2015:  [name] is followed for atrial fib. He has had a MAZE procedure which was unsuccessful. He had an A-fib ablation which has helped.  No A-fib. Swelling has resolved. Dyspnea has resolved. Doing well from a cardiac standpoint  Needs to have right thumb surgery. Also has bulging discs in his neck with pain radiation down his right arm/ shoulder   Dec. 30, 2015:  [name]'s BP has been elevated. He has had bronchitis and prednisone ( 2 rounds ) for the past several weeks.  Still has not had his thumb surgery.   March 12, 2014:   [name] is a 74 y.o. male who presents for follow-up of his coronary artery disease and atrial fibrillation.  He has had some DOE.  Was diagnosied with bronchitis several months ago.   thinks he had maintained NSR  No CP , sternotomy is healing up Has had his left thumb  surgery    March 29, 2014: He is still very short of breath with any exertion. He has an oxymeter - O2 dropped to 88% after walking back from the trash cans. Was 94% at rest. Has seen pulmonary, no evidence of COPD.   Has had a URI .  Has albuterol which does not seem to help.   05/24/2014:  [name] returns for follow-up visit. He's had a cardiac catheter station which revealed right to left shunting at the level of the SVC. 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 He has seen Dr. Roxy Manns but there are no optimal surgical options. The plan is to proceed with a transesophageal echo to make sure that he doesn't have any intracardiac shunting and to further evaluate the dynamic LV obstruction and the systolic anterior motion of the mitral valve.  August 23, 2014:  Koden called several  weeks ago with severe shortness of breath and fluid retention. Was resistant to lasix at high doses.   We started on Demedex 50 BID with metalazone 5 every 3rd day    Past Medical History  Diagnosis Date  . Hx of adenomatous colonic polyps   . Diabetes mellitus type II   . Hypertension   . Diverticulosis 2001  . Hyperlipidemia   . Persistent atrial fibrillation     a. s/p MAZE 04/2013 in setting of CABG. b. Amio stopped in 10/2013 after flutter ablation.  . Obstructive sleep apnea     compliant with CPAP  . Chronic diastolic congestive heart failure   . Hypertensive cardiomyopathy   . History of cardioversion     x3 (years uncertain)  . H/O hiatal hernia   . Basal cell carcinoma   . Adrenal adenoma   . CAD (coronary artery disease)     a. 04/2013 CABG x 2: LIMA to LAD, SVG to RI, EVH via R thigh.  . S/P Maze operation for atrial fibrillation     a. 04/2013: Complete bilateral atrial lesion set using cryothermy and bipolar radiofrequency ablation with clipping of LA appendage (@ time of CABG)  . GERD (gastroesophageal reflux disease)   . Depression   . Anxiety   . DJD (degenerative joint  disease)   . 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 DCCV 08/2013. b. s/p RFA 10/2013.  [name] Respiratory failure     a. Hypoxia 10/2013 - required supp O2 as inpatient, did not require it at discharge.  . Pleural effusion, left     a. s/p thoracentesis 05/2013.  [name] PFO (patent foramen ovale)     a. Small PFO by TEE 10/2013.  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 S/P Maze operation for atrial fibrillation 04/05/2013    Complete bilateral atrial lesion set using cryothermy and bipolar radiofrequency ablation with clipping of LA appendage via median sternotomy approach     Past Surgical History  Procedure Laterality Date  . Polinydal cyst      Removed  . Great toe arthrodesis, interphalangeal joint      Right foot  . Retina repair-right    . Cataract extraction      bilateral  . Basal cell carcinoma excision      x3 on face  . Polypectomy    . Cardiac catheterization      myocardial bridge but no cad  . Coronary artery bypass graft N/A 04/05/2013    Procedure: CORONARY ARTERY BYPASS GRAFTING (CABG) TIMES TWO USING LEFT INTERNAL MAMMARY ARTERY AND RIGHT SAPHENOUS LEG VEIN HARVESTED ENDOSCOPICALLY;  Surgeon: Rexene Alberts, MD;  Location: [facility];  Service: Open Heart Surgery;  Laterality: N/A;  . Maze N/A 04/05/2013    Procedure: MAZE;  Surgeon: Rexene Alberts, MD;  Location: [facility];  Service: Open Heart Surgery;  Laterality: N/A;  . Intraoperative transesophageal echocardiogram N/A 04/05/2013    Procedure: INTRAOPERATIVE TRANSESOPHAGEAL ECHOCARDIOGRAM;  Surgeon: Rexene Alberts, MD;  Location: [facility];  Service: Open Heart Surgery;  Laterality: N/A;  . Tee without cardioversion N/A 08/23/2013    Procedure: TRANSESOPHAGEAL ECHOCARDIOGRAM (TEE);  Surgeon: Sanda Klein, MD;  Location: [facility];  Service: Cardiovascular;  Laterality: N/A;  . Cardioversion N/A 08/23/2013    Procedure:  CARDIOVERSION;  Surgeon: Sanda Klein, MD;  Location: [facility];  Service: Cardiovascular;  Laterality:  N/A;  Darden Dates without cardioversion N/A 10/26/2013    Procedure: TRANSESOPHAGEAL ECHOCARDIOGRAM (TEE);  Surgeon: Sueanne Margarita, MD;  Location: [facility] ENDOSCOPY;  Service: Cardiovascular;  Laterality: N/A;  . Left heart catheterization with coronary angiogram N/A 03/07/2013    Procedure: LEFT HEART CATHETERIZATION WITH CORONARY ANGIOGRAM;  Surgeon: Burnell Blanks, MD;  Location: [facility] CATH LAB;  Service: Cardiovascular;  Laterality: N/A;  . Atrial fibrillation ablation N/A 10/26/2013    Procedure: ATRIAL FIBRILLATION ABLATION;  Surgeon: Coralyn Mark, MD;  Location: [facility] CATH LAB;  Service: Cardiovascular;  Laterality: N/A;  . Carpometacarpel suspension plasty Left 02/14/2014    Procedure: CARPOMETACARPEL ([place]) SUSPENSIONPLASTY THUMB  WITH  ABDUCTOR POLLICIS LONGUS TRANSFER AND STENOSING TENOSYNOVITIS RELEASE LEFT WRIST;  Surgeon: Charlotte Crumb, MD;  Location: [facility];  Service: Orthopedics;  Laterality: Left;  . Trapezium resection    . Right heart catheterization N/A 05/03/2014    Procedure: RIGHT HEART CATH;  Surgeon: Jolaine Artist, MD;  Location: [facility] CATH LAB;  Service: Cardiovascular;  Laterality: N/A;  . Tee without cardioversion N/A 06/05/2014    Procedure: TRANSESOPHAGEAL ECHOCARDIOGRAM (TEE);  Surgeon: Cory Headings, MD;  Location: [facility] ENDOSCOPY;  Service: Cardiovascular;  Laterality: N/A;     Current Outpatient Prescriptions  Medication Sig Dispense Refill  . ALPRAZolam (XANAX) 1 MG tablet Take 0.5 mg by mouth at bedtime as needed for sleep.     [name] atorvastatin (LIPITOR) 40 MG tablet Take 20 mg by mouth every Monday, Wednesday, and Friday.     . B Complex Vitamins (VITAMIN B COMPLEX PO) Take 1 tablet by mouth daily.    [name] Besifloxacin HCl 0.6 % SUSP Place 1 drop into the left eye See admin instructions. Use 1.5 days a month starting the day after get injection  into the eye    . carvedilol (COREG) 6.25 MG tablet Take 6.25 mg by mouth 2 (two) times daily with a meal.    . cholecalciferol (VITAMIN D) 1000 UNITS tablet Take 4,000-5,000 Units by mouth See admin instructions. Take 4 tablets four times a week and take 5 tablets three times a week    . diphenhydrAMINE (BENADRYL) 25 MG tablet Take 25 mg by mouth at bedtime as needed for allergies.     [name] glimepiride (AMARYL) 2 MG tablet Take 1 mg by mouth See admin instructions. Take 1/2 tablet daily unless blood sugar is > 120    . HYDROcodone-acetaminophen (NORCO/VICODIN) 5-325 MG per tablet Take 1 tablet by mouth every 6 (six) hours as needed for moderate pain. (Patient taking differently: Take 1 tablet by mouth at bedtime as needed for moderate pain. ) 30 tablet   . metFORMIN (GLUCOPHAGE-XR) 500 MG 24 hr tablet Take 1,000 mg by mouth 2 (two) times daily.     . metolazone (ZAROXOLYN) 5 MG tablet Take 1 pill every 3 days 30 minutes before Demadex 20 tablet 11  . minoxidil (LONITEN) 10 MG tablet Take 2 tablets (20 mg total) by mouth at bedtime. 90 tablet 3  . Multiple Vitamin (MULTIVITAMIN WITH MINERALS) TABS tablet Take 1 tablet by mouth daily.    . OXYGEN Inhale 2 L into the lungs continuous.    . potassium chloride (KLOR-CON) 20 MEQ packet Take 40 mEq by mouth 2 (two) times daily.    . ranitidine (ZANTAC) 150 MG tablet Take 150 mg by mouth daily as needed for heartburn.     . sertraline (ZOLOFT) 100 MG tablet TAKE 1 TABLET BY MOUTH DAILY  30 tablet 11  . torsemide (DEMADEX) 100 MG tablet Take 0.5 tablets (50 mg total) by mouth 2 (two) times daily. 31 tablet 11  . valsartan (DIOVAN) 320 MG tablet Take 1 tablet (320 mg total) by mouth daily. 90 tablet 3  . warfarin (COUMADIN) 5 MG tablet Take 7.5-10 mg by mouth daily after supper. Take 2 tablets (10 mg) Monday, Wednesday, Friday, take 1 1/2 tablets (7.5 mg) on Sunday, Tuesday, Thursday, Saturday     No current facility-administered medications for this visit.     Allergies:   Sunflower seed; Horse-derived products; and Tetanus toxoids    Social History:  The patient  reports that he quit smoking about 33 years ago. His smoking use included Cigarettes. He has a 100 pack-year smoking history. He has never used smokeless tobacco. He reports that he drinks alcohol. He reports that he does not use illicit drugs.   Family History:  The patient's family history includes Atrial fibrillation in his mother; Colon cancer in his mother; Colon polyps in his mother and sister; Dementia in his father; Diabetes in his maternal uncle; Hypertension in his mother; Stroke in his paternal uncle.    ROS:  Please see the history of present illness.    Review of Systems: Constitutional:  denies fever, chills, diaphoresis, appetite change and fatigue.  HEENT: denies photophobia, eye pain, redness, hearing loss, ear pain, congestion, sore throat, rhinorrhea, sneezing, neck pain, neck stiffness and tinnitus.  Respiratory: admits to   DOE,  Extreme shortness of breath with any walking   Cardiovascular: denies chest pain, palpitations and leg swelling.  Gastrointestinal: denies nausea, vomiting, abdominal pain, diarrhea, constipation, blood in stool.  Genitourinary: denies dysuria, urgency, frequency, hematuria, flank pain and difficulty urinating.  Musculoskeletal: denies  myalgias, back pain, joint swelling, arthralgias and gait problem.   Skin: denies pallor, rash and wound.  Neurological: denies dizziness, seizures, syncope, weakness, light-headedness, numbness and headaches.   Hematological: denies adenopathy, easy bruising, personal or family bleeding history.  Psychiatric/ Behavioral: denies suicidal ideation, mood changes, confusion, nervousness, sleep disturbance and agitation.       All other systems are reviewed and negative.    PHYSICAL EXAM: VS:  BP 90/58 mmHg  Pulse 63  Ht 5\' 11"  (1.803 m)  Wt 89.585 kg (197 lb 8 oz)  BMI 27.56 kg/m2  SpO2 95% ,  BMI Body mass index is 27.56 kg/(m^2). GEN: Well nourished, well developed, in no acute distress HEENT: normal Neck: no JVD, carotid bruits, or masses Cardiac: RRR; 2/6 systolic  Murmur,   Hyperdynamic PMI  rubs, or gallops,no edema  Respiratory:  Decreased breath sounds in left base, right side sounds ok GI: soft, nontender, distended, + BS MS: no deformity or atrophy Skin: warm and dry, no rash Neuro:  Strength and sensation are intact Psych: normal   EKG:  EKG is ordered today. ECG reveals probable NSR with 1st degree AV block , HR of 86   Recent Labs: 11/06/2013: Pro B Natriuretic peptide (BNP) 235.0* 06/14/2014: TSH 1.686 08/20/2014: ALT 29; BUN 59*; Creat 1.97*; Hemoglobin 10.4*; Magnesium 2.3; Platelets 233; Potassium 3.3*; Sodium 142    Lipid Panel    Component Value Date/Time   CHOL 106 06/14/2014 1232   TRIG 70 06/14/2014 1232   HDL 30* 06/14/2014 1232   CHOLHDL 3.5 06/14/2014 1232   VLDL 14 06/14/2014 1232   LDLCALC 62 06/14/2014 1232      Wt Readings from Last 3 Encounters:  08/23/14 89.585 kg (197 lb  8 oz)  08/20/14 87.998 kg (194 lb)  08/02/14 93.157 kg (205 lb 6 oz)      Other studies Reviewed: Additional studies/ records that were reviewed today include: . Review of the above records demonstrates:    ASSESSMENT AND PLAN:  1. Hypertension- blood pressures fairly well-controlled. He's slightly hyperdynamic.    2. HOCM - he has a systolic murmur consistent with hypertrophic obstructive cardiomyopathy  3. Aortic insuffuciency 4. Atrial fibrillation-failed Tikosyn, Has seen [facility] in [place], [place] .  So far he seems to be maintaining normal sinus rhythm. 5. Diabetes mellitus 6. 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 He has a hyperdynamic LV with systolic anterior motion of the mitral valve leaflet. 8. CABG and MAZE 04-05-13 - no recent angina. He is healing quite nicely. 9. Anomalous pulmonary vein - he's been evaluated by Dr. Roxy Manns. The  options are somewhat limited. Dr. Roxy Manns has requested a transesophageal echo for further evaluation of the heart and to make sure that there is not an intracardiac shunt. We'll schedule a transesophageal echo on May 31. -   Will see him in 3 months .  Current medicines are reviewed at length with the patient today.  The patient does not have concerns regarding medicines.  The following changes have been made:  See above.   Disposition:   FU with me in 3 months      Signed, Nahser, Wonda Cheng, MD  08/23/2014 10:15 AM    [facility] [place], [place], [place]  [address] Phone: [phone]; Fax: [phone]   Addendum:  Pt had a cardiopulmonary stress test.  Patient's O2 sat 88% on room air  at rest   He will need home O2.   Suggest starting 2 LPM and may titrate as needed to keep O2  sats >  90%.      Nahser, Wonda Cheng, MD  08/23/2014 10:15 AM    [facility],  [place], [place]  [address] Pager [phone] Phone: [phone]; Fax: [phone]   [facility]  [address] [place], [place]  [address] [phone]    Fax [phone]

## 2014-08-23 NOTE — Progress Notes (Signed)
Cardiology Office Note    Date:  08/23/2014   ID:  [name], DOB [birth year], MRN [id number]  PCP:  Cory Richards, MD  Cardiologist:   Cory Headings, MD   Chief Complaint  Patient presents with  . other    3 month follow up. Meds reviewed by the patient verbally. "doing well."    Problem List 1. Hypertension 2. HOCM 3. Aortic insuffuciency 4. Atrial fibrillation-failed Tikosyn, Has seen [name] in [place], [place] .  5. Diabetes mellitus 6. 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 He has a hyperdynamic LV with systolic anterior motion of the mitral valve leaflet. 8. CABG and MAZE 04-05-13 9. Anomalous pulmonary venous return  History of Present Illness:  [name] is a 74 year old gentleman with a history of hypertension, atrial fibrillation and a myocardial bridge. He is a previous patient of [name]. He also has a history of diabetes. He has felt fairly well. He has not had any episodes of chest pain or shortness of breath.   Nov. 25, 2013: He is very symptomatic with his Atrial fib. He gets very winded with walking. He has been on Eliquis and tikosyn 500 BID. He had an episode of severe dyspnea while at his farm in [place]. He was diagnosed with panic attack. Enzymes were normal. He is ready for his cardioversion.  Nov. 27, 2013: [name] was seen 2 days ago and was found to have atrial fibrillation. He was scheduled for cardioversion. When he showed up for cardioversion yesterday, he was in normal sinus rhythm.  He related to me that he had some medication changes over the past several weeks. He was previously on Tikosyn and citalopram and his atrial fibrillation seemed to be well-controlled on that. He's retired Software engineer and noted that the citalopram and Tikosyn interacted so his medical doctor changed him to Wellbutrin and stopped the citalopram. It was at that time that he started having increased palpitations, anxiety, and we noted  him to be in atrial fibrillation. He then discontinued the Wellbutrin and was started on Zoloft. He seems to be tolerating this combination. It was at that time that he showed up in the short stay Center and was found to have already converted to sinus rhythm.  May 31, 2012:  [name] is doing well. Still in NSR. No leg edema. He sticks to a low sodium diet. He has occasional CP - typically associated with arm / chest movement.   He wants to stop coumadin - interferes with his arthritis medications. He has maintained NSR since his last visit.   Nov. 24, 2014:  [name] is doing OK. He has some brief palps - he thinks they are PVCs. No sustained episodes of Afib. He has been getting Avastin injections into his eyes.   Jan. 12, 2015:  [name] is seen today as a work in visit for worsening ankle edema. He has not been able to associate his symptoms with change of diet. . he has also been noticing some increasing shortness of breath. He notes short or shortness of breath with any activity-example take the garbage cans out. He denies any PND. He uses a CPAP mask.   Jan. 26, 2015:  [name] is seen back today . He had CABG and MAZE on April 05, 2013 - slow recovery following surgery Had a left pleural tap ~ 4 weeks after surgery - 1.2 liters taken off - did much better after that  Had elective cardioversioin in early August, 2015.  [name] was seen 2 weeks ago with symptoms consistent with congestive heart failure. He had been in persistent atrial fibrillation and so his tikosyn was stopped. He is still in atrial fibrillation. His rate is well controlled.   He had an echocardiogram: Left ventricle: Wall thickness was increased in a pattern of moderate LVH. Systolic function was vigorous. The estimated ejection fraction was in the range of 75% to 80%. Wall motion was normal; there were no regional wall motion abnormalities. - Aortic valve: Trivial regurgitation. - Left atrium:  The atrium was mildly to moderately dilated. - Right ventricle: The cavity size was mildly dilated. Systolic function was mildly reduced. - Right atrium: The atrium was moderately dilated. - Pulmonary arteries: Systolic pressure was mildly increased. PA peak pressure: 84mm Hg  He was found to have mild pulmonary hypertension.  He continues to have some dyspnea. He has ankle edema. His BP log shows mildly elevated BP.  May 26, 2013: [name] has had 2 V CABG and MAZE (April 05, 2013) since I last saw him. . He has done well He is having some tenderness along the right thigh SVG harvest site. Also c/o left 4th and 5th finger tingling / numbness.   He has otherwise done well. No cardiac complaints.   August 30, 2013:  He was cardioverted several weeks ago. After the cardioversion, his metoprolol was doubled to 25 BID, and diltizem was added   Oct. 2, 2015:  He has gone back into atrial fib. He has known left pleural effusion. He has had more dyspnea. Saw [name] yesterday.  He is more lethargic. Has hypoxemia ( O2 sat of 88% with walking 50 feet)   Dec. 14, 2015:  [name] is followed for atrial fib. He has had a MAZE procedure which was unsuccessful. He had an A-fib ablation which has helped.  No A-fib. Swelling has resolved. Dyspnea has resolved. Doing well from a cardiac standpoint  Needs to have right thumb surgery. Also has bulging discs in his neck with pain radiation down his right arm/ shoulder   Dec. 30, 2015:  [name]'s BP has been elevated. He has had bronchitis and prednisone ( 2 rounds ) for the past several weeks.  Still has not had his thumb surgery.   March 12, 2014:   [name] is a 74 y.o. male who presents for follow-up of his coronary artery disease and atrial fibrillation.  He has had some DOE.  Was diagnosied with bronchitis several months ago.   thinks he had maintained NSR  No CP , sternotomy is healing up Has had his left thumb  surgery    March 29, 2014: He is still very short of breath with any exertion. He has an oxymeter - O2 dropped to 88% after walking back from the trash cans. Was 94% at rest. Has seen pulmonary, no evidence of COPD.   Has had a URI .  Has albuterol which does not seem to help.   05/24/2014:  [name] returns for follow-up visit. He's had a cardiac catheter station which revealed right to left shunting at the level of the SVC. 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 He has seen Dr. Roxy Manns but there are no optimal surgical options. The plan is to proceed with a transesophageal echo to make sure that he doesn't have any intracardiac shunting and to further evaluate the dynamic LV obstruction and the systolic anterior motion of the mitral valve.   Past Medical History  Diagnosis Date  .  Hx of adenomatous colonic polyps   . Diabetes mellitus type II   . Hypertension   . Diverticulosis 2001  . Hyperlipidemia   . Persistent atrial fibrillation     a. s/p MAZE 04/2013 in setting of CABG. b. Amio stopped in 10/2013 after flutter ablation.  . Obstructive sleep apnea     compliant with CPAP  . Chronic diastolic congestive heart failure   . Hypertensive cardiomyopathy   . History of cardioversion     x3 (years uncertain)  . H/O hiatal hernia   . Basal cell carcinoma   . Adrenal adenoma   . CAD (coronary artery disease)     a. 04/2013 CABG x 2: LIMA to LAD, SVG to RI, EVH via R thigh.  . S/P Maze operation for atrial fibrillation     a. 04/2013: Complete bilateral atrial lesion set using cryothermy and bipolar radiofrequency ablation with clipping of LA appendage (@ time of CABG)  . GERD (gastroesophageal reflux disease)   . Depression   . Anxiety   . DJD (degenerative joint disease)   . 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 DCCV 08/2013. b. s/p RFA 10/2013.  [name] Respiratory failure     a. Hypoxia 10/2013 - required supp O2 as inpatient, did not require it at discharge.  . Pleural  effusion, left     a. s/p thoracentesis 05/2013.  [name] PFO (patent foramen ovale)     a. Small PFO by TEE 10/2013.  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 S/P Maze operation for atrial fibrillation 04/05/2013    Complete bilateral atrial lesion set using cryothermy and bipolar radiofrequency ablation with clipping of LA appendage via median sternotomy approach     Past Surgical History  Procedure Laterality Date  . Polinydal cyst      Removed  . Great toe arthrodesis, interphalangeal joint      Right foot  . Retina repair-right    . Cataract extraction      bilateral  . Basal cell carcinoma excision      x3 on face  . Polypectomy    . Cardiac catheterization      myocardial bridge but no cad  . Coronary artery bypass graft N/A 04/05/2013    Procedure: CORONARY ARTERY BYPASS GRAFTING (CABG) TIMES TWO USING LEFT INTERNAL MAMMARY ARTERY AND RIGHT SAPHENOUS LEG VEIN HARVESTED ENDOSCOPICALLY;  Surgeon: Rexene Alberts, MD;  Location: [facility];  Service: Open Heart Surgery;  Laterality: N/A;  . Maze N/A 04/05/2013    Procedure: MAZE;  Surgeon: Rexene Alberts, MD;  Location: [facility];  Service: Open Heart Surgery;  Laterality: N/A;  . Intraoperative transesophageal echocardiogram N/A 04/05/2013    Procedure: INTRAOPERATIVE TRANSESOPHAGEAL ECHOCARDIOGRAM;  Surgeon: Rexene Alberts, MD;  Location: [facility];  Service: Open Heart Surgery;  Laterality: N/A;  . Tee without cardioversion N/A 08/23/2013    Procedure: TRANSESOPHAGEAL ECHOCARDIOGRAM (TEE);  Surgeon: Sanda Klein, MD;  Location: [facility];  Service: Cardiovascular;  Laterality: N/A;  . Cardioversion N/A 08/23/2013    Procedure: CARDIOVERSION;  Surgeon: Sanda Klein, MD;  Location: [facility] ENDOSCOPY;  Service: Cardiovascular;  Laterality: N/A;  . Tee without cardioversion N/A 10/26/2013    Procedure: TRANSESOPHAGEAL ECHOCARDIOGRAM (TEE);   Surgeon: Sueanne Margarita, MD;  Location: [facility] ENDOSCOPY;  Service: Cardiovascular;  Laterality: N/A;  . Left heart catheterization with coronary angiogram N/A 03/07/2013  Procedure: LEFT HEART CATHETERIZATION WITH CORONARY ANGIOGRAM;  Surgeon: Burnell Blanks, MD;  Location: [facility] CATH LAB;  Service: Cardiovascular;  Laterality: N/A;  . Atrial fibrillation ablation N/A 10/26/2013    Procedure: ATRIAL FIBRILLATION ABLATION;  Surgeon: Coralyn Mark, MD;  Location: [facility] CATH LAB;  Service: Cardiovascular;  Laterality: N/A;  . Carpometacarpel suspension plasty Left 02/14/2014    Procedure: CARPOMETACARPEL (Gonzalez) SUSPENSIONPLASTY THUMB  WITH  ABDUCTOR POLLICIS LONGUS TRANSFER AND STENOSING TENOSYNOVITIS RELEASE LEFT WRIST;  Surgeon: Charlotte Crumb, MD;  Location: [facility];  Service: Orthopedics;  Laterality: Left;  . Trapezium resection    . Right heart catheterization N/A 05/03/2014    Procedure: RIGHT HEART CATH;  Surgeon: Jolaine Artist, MD;  Location[facility] C[facility] LAB;  Service: Cardiovascular;  Laterality: N/A;  . Tee without cardioversion N/A 06/05/2014    Procedure: TRANSESOPHAGEAL ECHOCARDIOGRAM (TEE);  Surgeon: Cory Headings, MD;  Location: [facility] ENDOSCOPY;  Service: Cardiovascular;  Laterality: N/A;     Current Outpatient Prescriptions  Medication Sig Dispense Refill  . ALPRAZolam (XANAX) 1 MG tablet Take 0.5 mg by mouth at bedtime as needed for sleep.     [name] atorvastatin (LIPITOR) 40 MG tablet Take 20 mg by mouth every Monday, Wednesday, and Friday.     . B Complex Vitamins (VITAMIN B COMPLEX PO) Take 1 tablet by mouth daily.    [name] Besifloxacin HCl 0.6 % SUSP Place 1 drop into the left eye See admin instructions. Use 1.5 days a month starting the day after get injection into the eye    . carvedilol (COREG) 6.25 MG tablet Take 6.25 mg by mouth 2 (two) times daily with a meal.    . cholecalciferol (VITAMIN D) 1000 UNITS tablet Take 4,000-5,000 Units by mouth See admin  instructions. Take 4 tablets four times a week and take 5 tablets three times a week    . diphenhydrAMINE (BENADRYL) 25 MG tablet Take 25 mg by mouth at bedtime as needed for allergies.     [name] glimepiride (AMARYL) 2 MG tablet Take 1 mg by mouth See admin instructions. Take 1/2 tablet daily unless blood sugar is > 120    . HYDROcodone-acetaminophen (NORCO/VICODIN) 5-325 MG per tablet Take 1 tablet by mouth every 6 (six) hours as needed for moderate pain. (Patient taking differently: Take 1 tablet by mouth at bedtime as needed for moderate pain. ) 30 tablet   . metFORMIN (GLUCOPHAGE-XR) 500 MG 24 hr tablet Take 1,000 mg by mouth 2 (two) times daily.     . metolazone (ZAROXOLYN) 5 MG tablet Take 1 pill every 3 days 30 minutes before Demadex 20 tablet 11  . minoxidil (LONITEN) 10 MG tablet Take 2 tablets (20 mg total) by mouth at bedtime. 90 tablet 3  . Multiple Vitamin (MULTIVITAMIN WITH MINERALS) TABS tablet Take 1 tablet by mouth daily.    . OXYGEN Inhale 2 L into the lungs continuous.    . potassium chloride (KLOR-CON) 20 MEQ packet Take 40 mEq by mouth 2 (two) times daily.    . ranitidine (ZANTAC) 150 MG tablet Take 150 mg by mouth daily as needed for heartburn.     . sertraline (ZOLOFT) 100 MG tablet TAKE 1 TABLET BY MOUTH DAILY 30 tablet 11  . torsemide (DEMADEX) 100 MG tablet Take 0.5 tablets (50 mg total) by mouth 2 (two) times daily. 31 tablet 11  . valsartan (DIOVAN) 320 MG tablet Take 1 tablet (320 mg total) by mouth daily. 90 tablet 3  .  warfarin (COUMADIN) 5 MG tablet Take 7.5-10 mg by mouth daily after supper. Take 2 tablets (10 mg) Monday, Wednesday, Friday, take 1 1/2 tablets (7.5 mg) on Sunday, Tuesday, Thursday, Saturday     No current facility-administered medications for this visit.    Allergies:   Sunflower seed; Horse-derived products; and Tetanus toxoids    Social History:  The patient  reports that he quit smoking about 33 years ago. His smoking use included Cigarettes. He  has a 100 pack-year smoking history. He has never used smokeless tobacco. He reports that he drinks alcohol. He reports that he does not use illicit drugs.   Family History:  The patient's family history includes Atrial fibrillation in his mother; Colon cancer in his mother; Colon polyps in his mother and sister; Dementia in his father; Diabetes in his maternal uncle; Hypertension in his mother; Stroke in his paternal uncle.    ROS:  Please see the history of present illness.    Review of Systems: Constitutional:  denies fever, chills, diaphoresis, appetite change and fatigue.  HEENT: denies photophobia, eye pain, redness, hearing loss, ear pain, congestion, sore throat, rhinorrhea, sneezing, neck pain, neck stiffness and tinnitus.  Respiratory: admits to   DOE,  Extreme shortness of breath with any walking   Cardiovascular: denies chest pain, palpitations and leg swelling.  Gastrointestinal: denies nausea, vomiting, abdominal pain, diarrhea, constipation, blood in stool.  Genitourinary: denies dysuria, urgency, frequency, hematuria, flank pain and difficulty urinating.  Musculoskeletal: denies  myalgias, back pain, joint swelling, arthralgias and gait problem.   Skin: denies pallor, rash and wound.  Neurological: denies dizziness, seizures, syncope, weakness, light-headedness, numbness and headaches.   Hematological: denies adenopathy, easy bruising, personal or family bleeding history.  Psychiatric/ Behavioral: denies suicidal ideation, mood changes, confusion, nervousness, sleep disturbance and agitation.       All other systems are reviewed and negative.    PHYSICAL EXAM: VS:  BP 90/58 mmHg  Pulse 63  Ht 5\' 11"  (1.803 m)  Wt 89.585 kg (197 lb 8 oz)  BMI 27.56 kg/m2  SpO2 95% , BMI Body mass index is 27.56 kg/(m^2). GEN: Well nourished, well developed, in no acute distress HEENT: normal Neck: no JVD, carotid bruits, or masses Cardiac: RRR; 2/6 systolic  Murmur,   Hyperdynamic  PMI  rubs, or gallops,no edema  Respiratory:  Decreased breath sounds in left base, right side sounds ok GI: soft, nontender, nondistended, + BS MS: no deformity or atrophy Skin: warm and dry, no rash Neuro:  Strength and sensation are intact Psych: normal   EKG:  EKG is ordered today. ECG reveals probable NSR with 1st degree AV block , HR of 86   Recent Labs: 11/06/2013: Pro B Natriuretic peptide (BNP) 235.0* 06/14/2014: TSH 1.686 08/20/2014: ALT 29; BUN 59*; Creat 1.97*; Hemoglobin 10.4*; Magnesium 2.3; Platelets 233; Potassium 3.3*; Sodium 142    Lipid Panel    Component Value Date/Time   CHOL 106 06/14/2014 1232   TRIG 70 06/14/2014 1232   HDL 30* 06/14/2014 1232   CHOLHDL 3.5 06/14/2014 1232   VLDL 14 06/14/2014 1232   LDLCALC 62 06/14/2014 1232      Wt Readings from Last 3 Encounters:  08/23/14 89.585 kg (197 lb 8 oz)  08/20/14 87.998 kg (194 lb)  08/02/14 93.157 kg (205 lb 6 oz)      Other studies Reviewed: Additional studies/ records that were reviewed today include: . Review of the above records demonstrates:    ASSESSMENT AND PLAN:  1. Hypertension- blood pressures fairly well-controlled. He's slightly hyperdynamic.    2. HOCM - he has a systolic murmur consistent with hypertrophic obstructive cardiomyopathy  3. Aortic insuffuciency 4. Atrial fibrillation-failed Tikosyn, Has seen [name] in [place], [place] .  So far he seems to be maintaining normal sinus rhythm. 5. Diabetes mellitus 6. 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 He has a hyperdynamic LV with systolic anterior motion of the mitral valve leaflet. 8. CABG and MAZE 04-05-13 - no recent angina. He is healing quite nicely. 9. Anomalous pulmonary vein -  Has right sided pressure with pulmonary hypertension. He became very volume overloaded over the past week or so. Lasix was no longer working. We changed him to Demadex 50 mg by mouth twice a day with metolazone 5 g every 3 days. He diuresed  about 14 pounds and is feeling quite a bit better. He had lab work last Monday and I suspect that he's a little bit volume repleted. We will decrease the Demadex to 50 mg once a day and increase the potassium chloride to 40 mEq once a day. He has an appointment at [facility] in approximately 2 weeks.   Will see him in 3 months .  Current medicines are reviewed at length with the patient today.  The patient does not have concerns regarding medicines.  The following changes have been made:  See above.   Disposition:   FU with me in 3 months      Signed, Nahser, Wonda Cheng, MD  08/23/2014 10:13 AM    [facility] [place], [place], [place]  [address] Phone: [phone]; Fax: [phone]   Addendum:  Pt had a cardiopulmonary stress test.  Patient's O2 sat 88% on room air  at rest   He will need home O2.   Suggest starting 2 LPM and may titrate as needed to keep O2  sats >  90%.      Nahser, Wonda Cheng, MD  08/23/2014 10:13 AM    [facility],  [place], [place]  [address] Pager [phone] Phone: [phone]; Fax: [phone]   [facility]  [address] [place], [place]  [address] [phone]    Fax [phone]

## 2014-08-24 ENCOUNTER — Encounter: Payer: Self-pay | Admitting: Pulmonary Disease

## 2014-08-24 ENCOUNTER — Ambulatory Visit (INDEPENDENT_AMBULATORY_CARE_PROVIDER_SITE_OTHER): Payer: Medicare Other | Admitting: Pulmonary Disease

## 2014-08-24 VITALS — BP 124/62 | HR 65 | Ht 71.0 in | Wt 198.0 lb

## 2014-08-24 DIAGNOSIS — J9611 Chronic respiratory failure with hypoxia: Secondary | ICD-10-CM | POA: Diagnosis not present

## 2014-08-24 DIAGNOSIS — Q263 Partial anomalous pulmonary venous connection: Secondary | ICD-10-CM | POA: Diagnosis not present

## 2014-08-24 LAB — BASIC METABOLIC PANEL
BUN/Creatinine Ratio: 35 — ABNORMAL HIGH (ref 10–22)
BUN: 52 mg/dL — ABNORMAL HIGH (ref 8–27)
CO2: 28 mmol/L (ref 18–29)
CREATININE: 1.48 mg/dL — AB (ref 0.76–1.27)
Calcium: 9.7 mg/dL (ref 8.6–10.2)
Chloride: 100 mmol/L (ref 97–108)
GFR, EST AFRICAN AMERICAN: 53 mL/min/{1.73_m2} — AB (ref >59)
GFR, EST NON AFRICAN AMERICAN: 46 mL/min/{1.73_m2} — AB (ref >59)
Glucose: 223 mg/dL — ABNORMAL HIGH (ref 65–99)
POTASSIUM: 5 mmol/L (ref 3.5–5.2)
Sodium: 145 mmol/L — ABNORMAL HIGH (ref 134–144)

## 2014-08-24 NOTE — Patient Instructions (Signed)
Continue using her oxygen as you're doing Continue your diuretics as directed by cardiology Follow-up with [facility] See me back at the end of September or beginning of October

## 2014-08-24 NOTE — Assessment & Plan Note (Signed)
I believe that his chronic hypoxemic respiratory failure is primarily due to the partial anomalous pulmonary venous return. I have been unable to identify a new or worsening pulmonary problem as he has normal-appearing pulmonary parenchyma and his recent pleural effusion is not that large. It still unclear as to why this has caused a problem in the last several several months. He has associated pulmonary hypertension, but I do not feel that increasing his pulmonary perfusion and decreasing pulmonary arterial resistance is going to fix this problem.  He has a PFO, but it's unclear exactly how large that is as echocardiogram studies have shown different findings.  Plan: After speaking with many subspecialists and surgeons have been involved with his care here in town I have decided to refer him to [facility] for consideration of surgical repair for his partial anomalous pulmonary venous return

## 2014-08-24 NOTE — Progress Notes (Signed)
Subjective:    Patient ID: [name], male    DOB: [birth year], 74 y.o.   MRN: [id number]  Synopsis: Referred the [facility] in 2016 in the setting of chronic respiratory hypoxemia which is fairly quickly progressive. 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 Symptoms developed several months after a maze procedure and CABG in 2015.  HPI Chief Complaint  Patient presents with  . Follow-up    review VQ scan and pft.  pt states he is doing better since last visit.  pt states he breathes well as long as he keeps his 02 on.  also notes sinus congestion, pnd.     [name] says he has been doing OK. He said that his blood pressure was low yesterday when he saw his cardiologist so his diuretics.  He had been feeling a little light headed prior to this visit. He has had some belly, but no ankle swelling.  Shortness of breath is about the same as before, no significant cough.  Past Medical History  Diagnosis Date  . Hx of adenomatous colonic polyps   . Diabetes mellitus type II   . Hypertension   . Diverticulosis 2001  . Hyperlipidemia   . Persistent atrial fibrillation     a. s/p MAZE 04/2013 in setting of CABG. b. Amio stopped in 10/2013 after flutter ablation.  . Obstructive sleep apnea     compliant with CPAP  . Chronic diastolic congestive heart failure   . Hypertensive cardiomyopathy   . History of cardioversion     x3 (years uncertain)  . H/O hiatal hernia   . Basal cell carcinoma   . Adrenal adenoma   . CAD (coronary artery disease)     a. 04/2013 CABG x 2: LIMA to LAD, SVG to RI, EVH via R thigh.  . S/P Maze operation for atrial fibrillation     a. 04/2013: Complete bilateral atrial lesion set using cryothermy and bipolar radiofrequency ablation with clipping of LA appendage (@ time of CABG)  . GERD (gastroesophageal reflux disease)   . Depression   . Anxiety   . DJD (degenerative joint disease)   . 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 DCCV  08/2013. b. s/p RFA 10/2013.  [name] Respiratory failure     a. Hypoxia 10/2013 - required supp O2 as inpatient, did not require it at discharge.  . Pleural effusion, left     a. s/p thoracentesis 05/2013.  [name] PFO (patent foramen ovale)     a. Small PFO by TEE 10/2013.  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 S/P Maze operation for atrial fibrillation 04/05/2013    Complete bilateral atrial lesion set using cryothermy and bipolar radiofrequency ablation with clipping of LA appendage via median sternotomy approach      Review of Systems  Constitutional: Positive for fatigue. Negative for diaphoresis.  HENT: Negative for postnasal drip, rhinorrhea and sinus pressure.   Respiratory: Positive for shortness of breath. Negative for cough and wheezing.   Cardiovascular: Negative for chest pain, palpitations and leg swelling.       Objective:   Physical Exam Filed Vitals:   08/24/14 0953  BP: 124/62  Pulse: 65  Height: 5\' 11"  (1.803 m)  Weight: 198 lb (89.812 kg)  SpO2: 96%   4L [place]  Gen: chronically ill appearing HENT: OP clear, TM's clear, neck supple  PULM: CTA B, normal percussion CV: RRR, no mgr, trace edema GI: BS+, soft, nontender Derm: no cyanosis or rash Psyche: normal mood and affect  VQ scan from last month images personally reviewed, there is a focal ventilation and perfusion defect in the right upper lobe, no unmatched defects interpretation from radiology is very low probability for pulmonary embolism Yesterday's cardiology records from Dr. 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 I have been unable to identify a new  or worsening pulmonary problem as he has normal-appearing pulmonary parenchyma and his recent pleural effusion is not that large. It still unclear as to why this has caused a problem in the last several several months. He has associated pulmonary hypertension, but I do not feel that increasing his pulmonary perfusion and decreasing pulmonary arterial resistance is going to fix this problem.  He has a PFO, but it's unclear exactly how large that is as echocardiogram studies have shown different findings.  Plan: After speaking with many subspecialists and surgeons have been involved with his care here in town I have decided to refer him to [facility] for consideration of surgical repair for his partial anomalous pulmonary venous return  Chronic respiratory failure His nuclear medicine scan did not show evidence of a pulmonary embolism recently. He does not have evidence of pulmonary parenchymal abnormality on CT imaging. His recent pulmonary function testing showed a markedly diminished diffusion capacity which is in keeping with the study from June 2016. Again, I do not feel that he has pulmonary parenchymal disease but feel that this is primarily a vascular issue.  Plan: Continue 4 L of oxygen at rest, 5 L with exertion     Current outpatient prescriptions:  .  ALPRAZolam (XANAX) 1 MG tablet, Take 0.5 mg by mouth at bedtime as needed for sleep. , Disp: , Rfl:  .  atorvastatin (LIPITOR) 40 MG tablet, Take 20 mg by mouth every Monday, Wednesday, and Friday. , Disp: , Rfl:  .  B Complex Vitamins (VITAMIN B COMPLEX PO), Take 1 tablet by mouth daily., Disp: , Rfl:  .  Besifloxacin HCl 0.6 % SUSP, Place 1 drop into the left eye See admin instructions. Use 1.5 days a month starting the day after get injection into the eye, Disp: , Rfl:  .  carvedilol (COREG) 6.25 MG tablet, Take 6.25 mg by mouth 2 (two) times daily with a meal., Disp: , Rfl:  .  cholecalciferol (VITAMIN D) 1000 UNITS tablet, Take  4,000-5,000 Units by mouth See admin instructions. Take 4 tablets four times a week and take 5 tablets three times a week, Disp: , Rfl:  .  diphenhydrAMINE (BENADRYL) 25 MG tablet, Take 25 mg by mouth at bedtime as needed for allergies. , Disp: , Rfl:  .  glimepiride (AMARYL) 2 MG tablet, Take 1 mg by mouth See admin instructions. Take 1/2 tablet daily unless blood sugar is > 120, Disp: , Rfl:  .  HYDROcodone-acetaminophen (NORCO/VICODIN) 5-325 MG per tablet, Take 1 tablet by mouth every 6 (six) hours as needed for moderate pain. (Patient taking differently: Take 1 tablet by mouth at bedtime as needed for moderate pain. ), Disp: 30 tablet, Rfl:  .  metFORMIN (GLUCOPHAGE-XR) 500 MG 24 hr tablet, Take 1,000 mg by mouth  2 (two) times daily. , Disp: , Rfl:  .  metolazone (ZAROXOLYN) 5 MG tablet, Take 1 pill every 3 days 30 minutes before Demadex, Disp: 20 tablet, Rfl: 11 .  minoxidil (LONITEN) 10 MG tablet, Take 2 tablets (20 mg total) by mouth at bedtime., Disp: 90 tablet, Rfl: 3 .  Multiple Vitamin (MULTIVITAMIN WITH MINERALS) TABS tablet, Take 1 tablet by mouth daily., Disp: , Rfl:  .  OXYGEN, Inhale 2 L into the lungs continuous., Disp: , Rfl:  .  potassium chloride SA (K-DUR,KLOR-CON) 20 MEQ tablet, Take 1 tablet (20 mEq total) by mouth daily. (Patient taking differently: Take 20 mEq by mouth 2 (two) times daily. ), Disp: 30 tablet, Rfl: 11 .  ranitidine (ZANTAC) 150 MG tablet, Take 150 mg by mouth daily as needed for heartburn. , Disp: , Rfl:  .  sertraline (ZOLOFT) 100 MG tablet, TAKE 1 TABLET BY MOUTH DAILY, Disp: 30 tablet, Rfl: 11 .  torsemide (DEMADEX) 100 MG tablet, Take 0.5 tablets (50 mg total) by mouth daily., Disp: 30 tablet, Rfl: 11 .  valsartan (DIOVAN) 320 MG tablet, Take 1 tablet (320 mg total) by mouth daily., Disp: 90 tablet, Rfl: 3 .  warfarin (COUMADIN) 5 MG tablet, Take 7.5-10 mg by mouth daily after supper. Take 2 tablets (10 mg) Monday, Wednesday, Friday, take 1 1/2 tablets (7.5  mg) on Sunday, Tuesday, Thursday, Saturday, Disp: , Rfl:

## 2014-08-24 NOTE — Assessment & Plan Note (Signed)
His nuclear medicine scan did not show evidence of a pulmonary embolism recently. He does not have evidence of pulmonary parenchymal abnormality on CT imaging. His recent pulmonary function testing showed a markedly diminished diffusion capacity which is in keeping with the study from June 2016. Again, I do not feel that he has pulmonary parenchymal disease but feel that this is primarily a vascular issue.  Plan: Continue 4 L of oxygen at rest, 5 L with exertion

## 2014-09-03 DIAGNOSIS — Q262 Total anomalous pulmonary venous connection: Secondary | ICD-10-CM | POA: Insufficient documentation

## 2014-09-03 DIAGNOSIS — I5032 Chronic diastolic (congestive) heart failure: Secondary | ICD-10-CM | POA: Insufficient documentation

## 2014-09-03 DIAGNOSIS — J984 Other disorders of lung: Secondary | ICD-10-CM | POA: Insufficient documentation

## 2014-09-06 DIAGNOSIS — I517 Cardiomegaly: Secondary | ICD-10-CM | POA: Diagnosis not present

## 2014-09-06 DIAGNOSIS — I270 Primary pulmonary hypertension: Secondary | ICD-10-CM | POA: Diagnosis not present

## 2014-09-06 DIAGNOSIS — R0609 Other forms of dyspnea: Secondary | ICD-10-CM | POA: Diagnosis not present

## 2014-09-06 DIAGNOSIS — I081 Rheumatic disorders of both mitral and tricuspid valves: Secondary | ICD-10-CM | POA: Diagnosis not present

## 2014-09-06 DIAGNOSIS — J984 Other disorders of lung: Secondary | ICD-10-CM | POA: Diagnosis not present

## 2014-09-06 DIAGNOSIS — R0602 Shortness of breath: Secondary | ICD-10-CM | POA: Diagnosis not present

## 2014-09-06 DIAGNOSIS — I5032 Chronic diastolic (congestive) heart failure: Secondary | ICD-10-CM | POA: Diagnosis not present

## 2014-09-06 DIAGNOSIS — I371 Nonrheumatic pulmonary valve insufficiency: Secondary | ICD-10-CM | POA: Diagnosis not present

## 2014-09-06 DIAGNOSIS — Z114 Encounter for screening for human immunodeficiency virus [HIV]: Secondary | ICD-10-CM | POA: Diagnosis not present

## 2014-09-06 DIAGNOSIS — Z79899 Other long term (current) drug therapy: Secondary | ICD-10-CM | POA: Diagnosis not present

## 2014-09-06 DIAGNOSIS — Q262 Total anomalous pulmonary venous connection: Secondary | ICD-10-CM | POA: Diagnosis not present

## 2014-09-11 DIAGNOSIS — I480 Paroxysmal atrial fibrillation: Secondary | ICD-10-CM | POA: Diagnosis not present

## 2014-09-11 DIAGNOSIS — I2510 Atherosclerotic heart disease of native coronary artery without angina pectoris: Secondary | ICD-10-CM | POA: Diagnosis not present

## 2014-09-27 ENCOUNTER — Encounter: Payer: Self-pay | Admitting: Internal Medicine

## 2014-09-27 ENCOUNTER — Ambulatory Visit (INDEPENDENT_AMBULATORY_CARE_PROVIDER_SITE_OTHER): Payer: Medicare Other | Admitting: Internal Medicine

## 2014-09-27 VITALS — BP 90/52 | HR 76 | Temp 97.7°F | Resp 20 | Ht 71.0 in | Wt 209.4 lb

## 2014-09-27 DIAGNOSIS — Z6829 Body mass index (BMI) 29.0-29.9, adult: Secondary | ICD-10-CM | POA: Diagnosis not present

## 2014-09-27 DIAGNOSIS — N058 Unspecified nephritic syndrome with other morphologic changes: Secondary | ICD-10-CM

## 2014-09-27 DIAGNOSIS — Z5181 Encounter for therapeutic drug level monitoring: Secondary | ICD-10-CM

## 2014-09-27 DIAGNOSIS — Z7901 Long term (current) use of anticoagulants: Secondary | ICD-10-CM | POA: Diagnosis not present

## 2014-09-27 DIAGNOSIS — Z79899 Other long term (current) drug therapy: Secondary | ICD-10-CM

## 2014-09-27 DIAGNOSIS — Z23 Encounter for immunization: Secondary | ICD-10-CM | POA: Diagnosis not present

## 2014-09-27 DIAGNOSIS — I10 Essential (primary) hypertension: Secondary | ICD-10-CM | POA: Diagnosis not present

## 2014-09-27 DIAGNOSIS — E559 Vitamin D deficiency, unspecified: Secondary | ICD-10-CM | POA: Diagnosis not present

## 2014-09-27 DIAGNOSIS — E782 Mixed hyperlipidemia: Secondary | ICD-10-CM

## 2014-09-27 DIAGNOSIS — E1129 Type 2 diabetes mellitus with other diabetic kidney complication: Secondary | ICD-10-CM

## 2014-09-27 DIAGNOSIS — I480 Paroxysmal atrial fibrillation: Secondary | ICD-10-CM

## 2014-09-27 DIAGNOSIS — I5032 Chronic diastolic (congestive) heart failure: Secondary | ICD-10-CM | POA: Diagnosis not present

## 2014-09-27 LAB — BASIC METABOLIC PANEL WITH GFR
BUN: 36 mg/dL — ABNORMAL HIGH (ref 7–25)
CO2: 31 mmol/L (ref 20–31)
CREATININE: 1.44 mg/dL — AB (ref 0.70–1.18)
Calcium: 9.4 mg/dL (ref 8.6–10.3)
Chloride: 97 mmol/L — ABNORMAL LOW (ref 98–110)
GFR, EST AFRICAN AMERICAN: 55 mL/min — AB (ref >=60)
GFR, Est Non African American: 47 mL/min — ABNORMAL LOW (ref >=60)
GLUCOSE: 262 mg/dL — AB (ref 65–99)
Potassium: 3.8 mmol/L (ref 3.5–5.3)
SODIUM: 140 mmol/L (ref 135–146)

## 2014-09-27 LAB — CBC WITH DIFFERENTIAL/PLATELET
Basophils Absolute: 0 10*3/uL (ref 0.0–0.1)
Basophils Relative: 0 % (ref 0–1)
EOS ABS: 0.1 10*3/uL (ref 0.0–0.7)
Eosinophils Relative: 1 % (ref 0–5)
HCT: 30.2 % — ABNORMAL LOW (ref 39.0–52.0)
HEMOGLOBIN: 9.7 g/dL — AB (ref 13.0–17.0)
Lymphocytes Relative: 8 % — ABNORMAL LOW (ref 12–46)
Lymphs Abs: 0.7 10*3/uL (ref 0.7–4.0)
MCH: 29.6 pg (ref 26.0–34.0)
MCHC: 32.1 g/dL (ref 30.0–36.0)
MCV: 92.1 fL (ref 78.0–100.0)
MONO ABS: 0.7 10*3/uL (ref 0.1–1.0)
MONOS PCT: 9 % (ref 3–12)
MPV: 8.3 fL — ABNORMAL LOW (ref 8.6–12.4)
Neutro Abs: 6.7 10*3/uL (ref 1.7–7.7)
Neutrophils Relative %: 82 % — ABNORMAL HIGH (ref 43–77)
Platelets: 245 10*3/uL (ref 150–400)
RBC: 3.28 MIL/uL — ABNORMAL LOW (ref 4.22–5.81)
RDW: 16.8 % — ABNORMAL HIGH (ref 11.5–15.5)
WBC: 8.2 10*3/uL (ref 4.0–10.5)

## 2014-09-27 LAB — LIPID PANEL
CHOLESTEROL: 107 mg/dL — AB (ref 125–200)
HDL: 23 mg/dL — ABNORMAL LOW (ref >=40)
LDL Cholesterol: 61 mg/dL (ref <130)
TRIGLYCERIDES: 116 mg/dL (ref <150)
Total CHOL/HDL Ratio: 4.7 Ratio (ref <=5.0)
VLDL: 23 mg/dL (ref <30)

## 2014-09-27 LAB — HEPATIC FUNCTION PANEL
ALT: 26 U/L (ref 9–46)
AST: 25 U/L (ref 10–35)
Albumin: 4.2 g/dL (ref 3.6–5.1)
Alkaline Phosphatase: 230 U/L — ABNORMAL HIGH (ref 40–115)
BILIRUBIN DIRECT: 0.4 mg/dL — AB (ref <=0.2)
BILIRUBIN INDIRECT: 0.7 mg/dL (ref 0.2–1.2)
TOTAL PROTEIN: 7.3 g/dL (ref 6.1–8.1)
Total Bilirubin: 1.1 mg/dL (ref 0.2–1.2)

## 2014-09-27 LAB — MAGNESIUM: MAGNESIUM: 2.3 mg/dL (ref 1.5–2.5)

## 2014-09-27 LAB — TSH: TSH: 2.469 u[IU]/mL (ref 0.350–4.500)

## 2014-09-27 NOTE — Patient Instructions (Signed)

## 2014-09-27 NOTE — Progress Notes (Deleted)
   Subjective:    Patient ID: [name], male    DOB: 05/29/1940, 74 y.o.   MRN: [id number]  HPI    Review of Systems     Objective:   Physical Exam        Assessment & Plan:

## 2014-09-27 NOTE — Progress Notes (Signed)
Patient ID: [name], male   DOB: [birth year], 74 y.o.   MRN: [id number]   This very nice 74 y.o. 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 Patient is being evaluated for worsening respiratory insufficiency and chronic Rt Ht failure.   Patient is treated for HTN since 1999  & BP has been controlled at home. Today's BP is low at 90/50. In Apr 2015 , patient underwent CABG x 2V and Maze procedure. In Aug 2015, he had CV and in Oct 2015 he had RFA for recurrent Aflutter. In Oct 2015 , he was found to have a small PFO by TEE.  Patient has had a down hill course over the course of this year and been followed closely by Dr's  McQuaid & Nasher for worsening respiratory status with apparent consensus of cause due to an anomalous pulm venous return to [facility] and a PFO and patient is referred to [facility] for further evaluation. Patient reports O2 sats 90-92 at rest on 2-3 lpm and quickly desat's to mid 80's walking even short distances. Patient also is felt to have diastolic /rt heart failure and has worsening dependent edema and ascites.    Hyperlipidemia is controlled with diet & meds. Patient denies myalgias or other med SE's. Last Lipids were at goal - Cholesterol 106; HDL 30*; LDL 62; Triglycerides 70 on 06/14/2014.   Also, the patient has history of T2_NIDDM since 1995 and recently kidney functions have worsened - CKD 3 (GFR 50 ml/min) and has had no symptoms of reactive hypoglycemia, diabetic polys, paresthesias or visual blurring. Pt reports CBG's range 100-110.  Last A1c was  6.2% on 06/14/2014.   Further, the patient also has history of Vitamin D Deficiency and supplements vitamin D without any suspected side-effects. Last vitamin D was 63 on 06/14/2014.  Medication Sig  . ALPRAZolam (XANAX) 1 MG tablet Take 0.5 mg by mouth at bedtime as needed for sleep.   [name] atorvastatin (LIPITOR) 40 MG tablet Take 20 mg by mouth every Monday, Wednesday, and  Friday.   . B Complex Vitamins  Take 1 tablet by mouth daily.  [name] Besifloxacin HCl 0.6 % SUSP Place 1 drop into the left eye See admin instructions. Use 1.5 days a month starting the day after get injection into the eye  . carvedilol (COREG) 6.25 MG tablet Take 6.25 mg by mouth 2 (two) times daily with a meal.  . VITAMIN D 1000 UNITS Take 4,000-5,000 Units by mouth See admin instructions. Take 4 tablets four times a week and take 5 tablets three times a week  . diphenhydrAMINE  25 MG tablet Take 25 mg by mouth at bedtime as needed for allergies.   [name] glimepiride (AMARYL) 2 MG tablet Take 1 mg by mouth See admin instructions. Take 1/2 tablet daily unless blood sugar is > 120  . NORCO 5-325 MG  Take 1 tablet by mouth every 6 (six) hours as needed for moderate pain. (Patient taking differently: Take 1 tablet by mouth at bedtime as needed for moderate pain. )  . metFORMIN -XR) 500 MG 24 hr tablet Take 1,000 mg by mouth 2 (two) times daily.   . metolazone (ZAROXOLYN) 5 MG tablet Take 1 pill every 3 days 30 minutes before Demadex  . minoxidil (LONITEN) 10 MG tablet Take 2 tablets (20 mg total) by mouth at bedtime.  . MULTIVITAMIN WITH MINERALS Take 1 tablet by mouth daily.  . OXYGEN  Inhale 2 - 3 L into the lungs continuous.  . potassium chloride SA 20 MEQ tablet Take 20 mEq by mouth 2  times daily.   . ranitidine (ZANTAC) 150 MG tablet Take 150 mg by mouth daily as needed for heartburn.   . sertraline (ZOLOFT) 100 MG tablet TAKE 1 TABLET BY MOUTH DAILY  . torsemide (DEMADEX) 100 MG tablet Take 0.5 tablets (50 mg total) by mouth daily.  . valsartan (DIOVAN) 320 MG tablet Take 1 tablet (320 mg total) by mouth daily.  [name] warfarin (COUMADIN) 5 MG tablet Take 2 tablets (10 mg) daily   PMHx:   Past Medical History  Diagnosis Date  . Hx of adenomatous colonic polyps   . Diabetes mellitus type II   . Hypertension   . Diverticulosis 2001  . Hyperlipidemia   . Persistent atrial fibrillation     a. s/p MAZE  04/2013 in setting of CABG. b. Amio stopped in 10/2013 after flutter ablation.  . Obstructive sleep apnea     compliant with CPAP  . Chronic diastolic congestive heart failure   . Hypertensive cardiomyopathy   . History of cardioversion     x3 (years uncertain)  . H/O hiatal hernia   . Basal cell carcinoma   . Adrenal adenoma   . CAD (coronary artery disease)     a. 04/2013 CABG x 2: LIMA to LAD, SVG to RI, EVH via R thigh.  . S/P Maze operation for atrial fibrillation     a. 04/2013: Complete bilateral atrial lesion set using cryothermy and bipolar radiofrequency ablation with clipping of LA appendage (@ time of CABG)  . GERD (gastroesophageal reflux disease)   . Depression   . Anxiety   . DJD (degenerative joint disease)   . 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 DCCV 08/2013. b. s/p RFA 10/2013.  [name] Respiratory failure     a. Hypoxia 10/2013 - required supp O2 as inpatient, did not require it at discharge.  . Pleural effusion, left     a. s/p thoracentesis 05/2013.  [name] PFO (patent foramen ovale)     a. Small PFO by TEE 10/2013.  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 S/P Maze operation for atrial fibrillation 04/05/2013    Complete bilateral atrial lesion set using cryothermy and bipolar radiofrequency ablation with clipping of LA appendage via median sternotomy approach    Immunization History  Administered Date(s) Administered  . Influenza Split 10/25/2012  . Influenza, High Dose Seasonal PF 09/26/2013, 09/27/2014  . Pneumococcal Conjugate-13 01/30/2014  . Pneumococcal Polysaccharide-23 10/20/2011  . Td 01/06/2000   Past Surgical History  Procedure Laterality Date  . Polinydal cyst      Removed  . Great toe arthrodesis, interphalangeal joint      Right foot  . Retina repair-right    . Cataract extraction      bilateral  . Basal cell carcinoma excision      x3  on face  . Polypectomy    . Cardiac catheterization      myocardial bridge but no cad  . Coronary artery bypass graft N/A 04/05/2013    Procedure: CORONARY ARTERY BYPASS GRAFTING (CABG) TIMES TWO USING LEFT INTERNAL MAMMARY ARTERY AND RIGHT SAPHENOUS LEG VEIN HARVESTED ENDOSCOPICALLY;  Surgeon: Rexene Alberts, MD;  Location: [place];  Service: Open Heart Surgery;  Laterality: N/A;  . Maze N/A  04/05/2013    Procedure: MAZE;  Surgeon: Rexene Alberts, MD;  Location: [place];  Service: Open Heart Surgery;  Laterality: N/A;  . Intraoperative transesophageal echocardiogram N/A 04/05/2013    Procedure: INTRAOPERATIVE TRANSESOPHAGEAL ECHOCARDIOGRAM;  Surgeon: Rexene Alberts, MD;  Location: [employer] [facility];  Service: Open Heart Surgery;  Laterality: N/A;  . Tee without cardioversion N/A 08/23/2013    Procedure: TRANSESOPHAGEAL ECHOCARDIOGRAM (TEE);  Surgeon: Sanda Klein, MD;  Location: [place];  Service: Cardiovascular;  Laterality: N/A;  . Cardioversion N/A 08/23/2013    Procedure: CARDIOVERSION;  Surgeon: Sanda Klein, MD;  Location: [facility] ENDOSCOPY;  Service: Cardiovascular;  Laterality: N/A;  . Tee without cardioversion N/A 10/26/2013    Procedure: TRANSESOPHAGEAL ECHOCARDIOGRAM (TEE);  Surgeon: Sueanne Margarita, MD;  Location: [facility] ENDOSCOPY;  Service: Cardiovascular;  Laterality: N/A;  . Left heart catheterization with coronary angiogram N/A 03/07/2013    Procedure: LEFT HEART CATHETERIZATION WITH CORONARY ANGIOGRAM;  Surgeon: Burnell Blanks, MD;  Location: [facility] C[facility] LAB;  Service: Cardiovascular;  Laterality: N/A;  . Atrial fibrillation ablation N/A 10/26/2013    Procedure: ATRIAL FIBRILLATION ABLATION;  Surgeon: Coralyn Mark, MD;  Location: [place] CATH LAB;  Service: Cardiovascular;  Laterality: N/A;  . Carpometacarpel suspension plasty Left 02/14/2014    Procedure: CARPOMETACARPEL ([place]) SUSPENSIONPLASTY THUMB  WITH  ABDUCTOR POLLICIS LONGUS TRANSFER AND STENOSING TENOSYNOVITIS RELEASE LEFT WRIST;  Surgeon:  Charlotte Crumb, MD;  Location: [place];  Service: Orthopedics;  Laterality: Left;  . Trapezium resection    . Right heart catheterization N/A 05/03/2014    Procedure: RIGHT HEART CATH;  Surgeon: Jolaine Artist, MD;  Location: [facility] CATH LAB;  Service: Cardiovascular;  Laterality: N/A;  . Tee without cardioversion N/A 06/05/2014    Procedure: TRANSESOPHAGEAL ECHOCARDIOGRAM (TEE);  Surgeon: Thayer Headings, MD;  Location: [facility] ENDOSCOPY;  Service: Cardiovascular;  Laterality: N/A;   FHx:    Reviewed / unchanged  SHx:    Reviewed / unchanged  Systems Review:  Constitutional: Denies fever, chills, wt changes, headaches, insomnia, fatigue, night sweats, change in appetite. Eyes: Denies redness, blurred vision, diplopia, discharge, itchy, watery eyes.  ENT: Denies discharge, congestion, post nasal drip, epistaxis, sore throat, earache, hearing loss, dental pain, tinnitus, vertigo, sinus pain, snoring.  CV: Denies chest pain, palpitations, irregular heartbeat, syncope, dyspnea, diaphoresis, orthopnea, PND, claudication or edema. Respiratory: denies cough, dyspnea, DOE, pleurisy, hoarseness, laryngitis, wheezing.  Gastrointestinal: Denies dysphagia, odynophagia, heartburn, reflux, water brash, abdominal pain or cramps, nausea, vomiting, bloating, diarrhea, constipation, hematemesis, melena, hematochezia  or hemorrhoids. Genitourinary: Denies dysuria, frequency, urgency, nocturia, hesitancy, discharge, hematuria or flank pain. Musculoskeletal: Denies arthralgias, myalgias, stiffness, jt. swelling, pain, limping or strain/sprain.  Skin: Denies pruritus, rash, hives, warts, acne, eczema or change in skin lesion(s). Neuro: No weakness, tremor, incoordination, spasms, paresthesia or pain. Psychiatric: Denies confusion, memory loss or sensory loss. Endo: Denies change in weight, skin or hair change.  Heme/Lymph: No excessive bleeding, bruising or enlarged lymph nodes.  Physical  Exam  BP 90/52 mmHg  Pulse 76  Temp(Src) 97.7 F (36.5 C)  Resp 20  Ht 5\' 11"  (1.803 m)  Wt 209 lb 6.4 oz (94.983 kg)  BMI 29.22 kg/m2  Appears  in no distress. Eyes: PERRLA, EOMs, conjunctiva no swelling or erythema. Sinuses: No frontal/maxillary tenderness ENT/Mouth: EAC's clear, TM's nl w/o erythema, bulging. Nares clear w/o erythema, swelling, exudates. Oropharynx clear without erythema or exudates. Oral hygiene is good. Tongue normal, non obstructing. Hearing intact.  Neck: Supple. Thyroid nl.  Car 2+/2+ without bruits, nodes or JVD. Chest: Respirations nl with BS clear & equal w/o rales, rhonchi, wheezing or stridor.  Cor: Heart sounds soft & sl irreg rate/rhythm. Peripheral pulses obscured by 1-2 (+) ankle/pedal edema.  Abdomen: Soft & bowel sounds normal. Non-tender w/o guarding, rebound, hernias, masses, or organomegaly.  Lymphatics: Unremarkable.  Musculoskeletal: Full ROM all peripheral extremities, joint stability, 5/5 strength, and normal gait.  Skin: Warm, dry without exposed rashes, lesions or ecchymosis apparent.  Neuro: Cranial nerves intact, reflexes equal bilaterally. Sensory-motor testing grossly intact. Tendon reflexes grossly intact.  Pysch: Alert & oriented x 3.  Insight and judgement nl & appropriate. No ideations.  Assessment and Plan:  1. Essential hypertension  - TSH  2. Hyperlipidemia  - Lipid panel  3. 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 PAF (paroxysmal atrial fibrillation)   5. Vitamin D deficiency  - Vit D  25 hydroxy   6. BMI 29.0-29.9,adult   7. Medication management  - Protime-INR - CBC with Differential/Platelet - BASIC METABOLIC PANEL WITH GFR - Hepatic function panel - Magnesium  8. Chronic diastolic congestive heart failure  - Brain natriuretic peptide   9. Encounter for monitoring coumadin therapy  - Protime-INR  10. Need for prophylactic vaccination and inoculation against  influenza  - Flu vaccine HIGH DOSE PF (Fluzone High dose)   Recommended regular exercise, BP monitoring, weight control, and discussed med and SE's. Recommended labs to assess and monitor clinical status. Further disposition pending results of labs. Over 30 minutes of exam, counseling, chart review was performed

## 2014-09-28 LAB — PROTIME-INR
INR: 2.35 — ABNORMAL HIGH (ref <1.50)
PROTHROMBIN TIME: 26.1 s — AB (ref 11.6–15.2)

## 2014-09-28 LAB — INSULIN, RANDOM: Insulin: 7.8 u[IU]/mL (ref 2.0–19.6)

## 2014-09-28 LAB — VITAMIN D 25 HYDROXY (VIT D DEFICIENCY, FRACTURES): Vit D, 25-Hydroxy: 97 ng/mL (ref 30–100)

## 2014-09-28 LAB — HEMOGLOBIN A1C
Hgb A1c MFr Bld: 6.8 % — ABNORMAL HIGH (ref <5.7)
Mean Plasma Glucose: 148 mg/dL — ABNORMAL HIGH (ref <117)

## 2014-10-05 ENCOUNTER — Telehealth: Payer: Self-pay | Admitting: Pulmonary Disease

## 2014-10-05 NOTE — Telephone Encounter (Signed)
No, I have not and there is no note in Care Everywhere but I can see where he was seen there  Please contact Dr. Sharion Balloon office to try to get her progress/consult note  Alisia Ferrari (Attending) [phone] (Work) [phone] (Fax)  [facility] [address] [place], [place] [address]

## 2014-10-05 NOTE — Telephone Encounter (Signed)
Called and spoke with pt Pt states going to [facility] recently per Dr Anastasia Pall request  Pt concerned because he states that he has called [facility] numerous times over the past weeks to get results and find out what the next step will be and no one is getting back to him He was calling today to see if Dr Lake Bells had been in contact with anyone from [place] about his case  Pt states that he saw Dr Gilles Chiquito at [facility]  Pt requested that message be sent to Dr Lake Bells to see if he could help with matter  Dr Lake Bells, have you heard anything about this pt? Please advise

## 2014-10-05 NOTE — Telephone Encounter (Signed)
Consult note, labs, etc were requested from Dr Sharion Balloon office  Will forward to Dr Lake Bells as an FYI  Nothing further is needed

## 2014-10-11 DIAGNOSIS — I480 Paroxysmal atrial fibrillation: Secondary | ICD-10-CM | POA: Diagnosis not present

## 2014-10-11 DIAGNOSIS — I2510 Atherosclerotic heart disease of native coronary artery without angina pectoris: Secondary | ICD-10-CM | POA: Diagnosis not present

## 2014-10-15 ENCOUNTER — Ambulatory Visit (INDEPENDENT_AMBULATORY_CARE_PROVIDER_SITE_OTHER): Payer: Medicare Other | Admitting: Pulmonary Disease

## 2014-10-15 ENCOUNTER — Encounter: Payer: Self-pay | Admitting: Pulmonary Disease

## 2014-10-15 VITALS — BP 126/64 | HR 71 | Ht 71.0 in | Wt 208.0 lb

## 2014-10-15 DIAGNOSIS — Q263 Partial anomalous pulmonary venous connection: Secondary | ICD-10-CM | POA: Diagnosis not present

## 2014-10-15 NOTE — Assessment & Plan Note (Signed)
We still are awaiting the results of his evaluation at [facility] 7 weeks ago. We have tried contacting their office but unfortunately we've been unable to see the clinic notes. Today I called  Dr. Sharion Balloon office to see if they had a plan for Mr. back. I'm concerned that his condition is worsening. For now he will continue using oxygen at his current prescription. If I have not heard anything from [place] in the next 24 hours then we may need to refer him to [facility] because I feel that his overall condition is worsening and we need to act soon.

## 2014-10-15 NOTE — Patient Instructions (Signed)
We will contact the [facility] pulmonary hypertension clinic and let you know with their plans are, however, if we are unable to get an answer within 24 hours we will make referral to [place] using her oxygen as you're doing

## 2014-10-15 NOTE — Progress Notes (Signed)
Subjective:    Patient ID: [name], male    DOB: July 06, 1940, 74 y.o.   MRN: [id number]  Synopsis: Referred the [facility] in 2016 in the setting of chronic respiratory hypoxemia which is fairly quickly progressive. 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 Symptoms developed several months after a maze procedure and CABG in 2015.  HPI Chief Complaint  Patient presents with  . Follow-up    pt c/o PND, nonprod cough, shortness of breath.  states his breathing is stable since last ov.     He feels that his breathing is worse. He is short of breath much faster than before with less activity.  Just today with minimal activity he got dyspneic with minimal activity.   They saw Dr. Gilles Chiquito for this study.  Apparently there was a PFO noted. He is gaining weight to some degree, prehaps 3-4 pounds over 3-4 days, but nothing "radical".  His abdomen remains distended.  He continues to struggle with urinary frequency and urgency.  He continues to struggle with constipation.   Past Medical History  Diagnosis Date  . Hx of adenomatous colonic polyps   . Diabetes mellitus type II   . Hypertension   . Diverticulosis 2001  . Hyperlipidemia   . Persistent atrial fibrillation ([place])     a. s/p MAZE 04/2013 in setting of CABG. b. Amio stopped in 10/2013 after flutter ablation.  . Obstructive sleep apnea     compliant with CPAP  . Chronic diastolic congestive heart failure ([place])   . Hypertensive cardiomyopathy ([place])   . History of cardioversion     x3 (years uncertain)  . H/O hiatal hernia   . Basal cell carcinoma   . Adrenal adenoma   . CAD (coronary artery disease)     a. 04/2013 CABG x 2: LIMA to LAD, SVG to RI, EVH via R thigh.  . S/P Maze operation for atrial fibrillation     a. 04/2013: Complete bilateral atrial lesion set using cryothermy and bipolar radiofrequency ablation with clipping of LA appendage (@ time of CABG)  . GERD (gastroesophageal reflux disease)    . Depression   . Anxiety   . DJD (degenerative joint disease)   . Atypical atrial flutter ([place]) 8/15, 10/15    a. DCCV 08/2013. b. s/p RFA 10/2013.  [name] Respiratory failure ([place])     a. Hypoxia 10/2013 - required supp O2 as inpatient, did not require it at discharge.  . Pleural effusion, left     a. s/p thoracentesis 05/2013.  [name] PFO (patent foramen ovale)     a. Small PFO by TEE 10/2013.  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 S/P Maze operation for atrial fibrillation 04/05/2013    Complete bilateral atrial lesion set using cryothermy and bipolar radiofrequency ablation with clipping of LA appendage via median sternotomy approach      Review of Systems  Constitutional: Positive for fatigue. Negative for diaphoresis.  HENT: Negative for postnasal drip, rhinorrhea and sinus pressure.   Respiratory: Positive for shortness of breath. Negative for cough and wheezing.   Cardiovascular: Negative for chest pain, palpitations and leg swelling.       Objective:   Physical Exam Filed Vitals:   10/15/14 1550  BP: 126/64  Pulse: 71  Height: 5\' 11"  (1.803 m)  Weight: 208 lb (94.348 kg)  SpO2: 92%  4L [place]  Gen: chronically ill appearing HENT: OP clear, TM's clear, neck supple PULM: CTA B, normal percussion CV: RRR, no mgr, trace edema GI: BS+, soft, nontender, distended abdomen Derm: no cyanosis or rash Psyche: normal mood and affect  Records from his visit at [facility] were reviewed where he had an echocardiogram showing a very enlarged right ventricle, but unfortunately no clinic visits are available for my review     Assessment & Plan:  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 We have tried contacting their office but unfortunately we've been unable to see the clinic  notes. Today I called  Dr. Sharion Balloon office to see if they had a plan for Mr. back. I'm concerned that his condition is worsening. For now he will continue using oxygen at his current prescription. If I have not heard anything from [place] in the next 24 hours then we may need to refer him to [facility] because I feel that his overall condition is worsening and we need to act soon.      Current outpatient prescriptions:  .  ALPRAZolam (XANAX) 1 MG tablet, Take 0.5 mg by mouth at bedtime as needed for sleep. , Disp: , Rfl:  .  atorvastatin (LIPITOR) 40 MG tablet, Take 20 mg by mouth every Monday, Wednesday, and Friday. , Disp: , Rfl:  .  B Complex Vitamins (VITAMIN B COMPLEX PO), Take 1 tablet by mouth daily., Disp: , Rfl:  .  Besifloxacin HCl 0.6 % SUSP, Place 1 drop into the left eye See admin instructions. Use 1.5 days a month starting the day after get injection into the eye, Disp: , Rfl:  .  carvedilol (COREG) 6.25 MG tablet, Take 6.25 mg by mouth 2 (two) times daily with a meal., Disp: , Rfl:  .  cholecalciferol (VITAMIN D) 1000 UNITS tablet, Take 4,000-5,000 Units by mouth See admin instructions. Take 4 tablets four times a week and take 5 tablets three times a week, Disp: , Rfl:  .  diphenhydrAMINE (BENADRYL) 25 MG tablet, Take 25 mg by mouth at bedtime as needed for allergies. , Disp: , Rfl:  .  glimepiride (AMARYL) 2 MG tablet, Take 1 mg by mouth See admin instructions. Take 1/2 tablet daily unless blood sugar is > 120, Disp: , Rfl:  .  HYDROcodone-acetaminophen (NORCO/VICODIN) 5-325 MG per tablet, Take 1 tablet by mouth every 6 (six) hours as needed for moderate pain. (Patient taking differently: Take 1 tablet by mouth at bedtime as needed for moderate pain. ), Disp: 30 tablet, Rfl:  .  metFORMIN (GLUCOPHAGE-XR) 500 MG 24 hr tablet, Take 1,000 mg by mouth 2 (two) times daily. , Disp: , Rfl:  .  metolazone (ZAROXOLYN) 5 MG tablet, Take 1 pill every 3 days 30 minutes before Demadex,  Disp: 20 tablet, Rfl: 11 .  minoxidil (LONITEN) 10 MG tablet, Take 2 tablets (20 mg total) by mouth at bedtime., Disp: 90 tablet, Rfl: 3 .  Multiple Vitamin (MULTIVITAMIN WITH MINERALS) TABS tablet, Take 1 tablet by mouth daily., Disp: , Rfl:  .  OXYGEN, Inhale 2 L into the lungs continuous., Disp: , Rfl:  .  potassium chloride SA (K-DUR,KLOR-CON) 20 MEQ tablet, Take 1 tablet (20 mEq total) by mouth daily. (Patient taking differently: Take 20 mEq by mouth 2 (two) times daily. ), Disp: 30 tablet, Rfl: 11 .  ranitidine (ZANTAC) 150 MG tablet, Take 150  mg by mouth daily as needed for heartburn. , Disp: , Rfl:  .  sertraline (ZOLOFT) 100 MG tablet, TAKE 1 TABLET BY MOUTH DAILY, Disp: 30 tablet, Rfl: 11 .  torsemide (DEMADEX) 100 MG tablet, Take 0.5 tablets (50 mg total) by mouth daily., Disp: 30 tablet, Rfl: 11 .  valsartan (DIOVAN) 320 MG tablet, Take 1 tablet (320 mg total) by mouth daily., Disp: 90 tablet, Rfl: 3 .  warfarin (COUMADIN) 5 MG tablet, Take 10 mg by mouth daily after supper. , Disp: , Rfl:

## 2014-10-16 ENCOUNTER — Telehealth: Payer: Self-pay | Admitting: Pulmonary Disease

## 2014-10-16 ENCOUNTER — Telehealth: Payer: Self-pay

## 2014-10-16 DIAGNOSIS — I272 Pulmonary hypertension, unspecified: Secondary | ICD-10-CM

## 2014-10-16 NOTE — Telephone Encounter (Signed)
A,  I want to refer him to cardiothoracic surgery at [facility]. I called the folks at [facility] yesterday and they still haven't called me back. Enough is enough, we need to get him seen somewhere else.    The reason for the referral is for his anomolous pulmonary venous return, PFO, and pulmonary hypertension. He needs to see cardiothoracic surgery in [place] at [place].   Please make the referral and let him know. He knew this would be the plan if I had not heard anything back yet from [place] by now.   Thanks  Ruby Cola  ---------------------- Spoke with pt, aware of recs.  Referral placed.  Nothing further needed at this time.

## 2014-10-16 NOTE — Telephone Encounter (Signed)
After sending this order and speaking to pt, [facility] doctors called BQ back. Per BQ, the [place] doctors will be calling pt directly to discuss results and recs.  Referral to [facility] can be cancelled. Spoke to pt to make aware.    Sending to [facility] to make aware that referral to cardiothoracic surgery needs to be cancelled.  I cannot cancel this myself.  Thanks!

## 2014-10-17 ENCOUNTER — Other Ambulatory Visit: Payer: Self-pay

## 2014-10-17 MED ORDER — METFORMIN HCL ER 500 MG PO TB24: 1000.0000 mg | ORAL_TABLET | Freq: Two times a day (BID) | ORAL | Status: DC

## 2014-10-17 NOTE — Telephone Encounter (Signed)
I called [name] to explain that I had discussed his case with Rich Reining at [facility] yesterday.  She plans to review his echocardiogram again and call him back about potential interventions.

## 2014-10-17 NOTE — Telephone Encounter (Signed)
Patient calling to get more information regarding his visit to [facility].  Wants to know if Dr. Lake Bells got anymore information from the doctors at [facility].  Records from [place] visit on 9/1 are in [place].  Dr. Lake Bells, please advise.

## 2014-10-22 ENCOUNTER — Telehealth: Payer: Self-pay | Admitting: Cardiovascular Disease

## 2014-10-22 NOTE — Patient Outreach (Signed)
[place] [facility]) Care Management  10/22/2014  [name] 11-06-1940 [id number]   Referral from [place], assigned Mariann Laster, RN to outreach for [place] Management services.  Thanks, [name]. [place], [place] Assistant Phone: [phone] Fax: [phone]

## 2014-10-22 NOTE — Telephone Encounter (Signed)
New message      STAT if patient is at the pharmacy , call can be transferred to refill team.   1. Which medications need to be refilled? Torsemide 100mg   2. Which pharmacy/location is medication [facility] [facility]. Do they need a 30 day or 90 day supply? 30 day supply

## 2014-10-23 MED ORDER — TORSEMIDE 100 MG PO TABS: ORAL_TABLET | ORAL | Status: DC

## 2014-10-23 NOTE — Telephone Encounter (Signed)
He may take extra torsemide as needed. Lets check a BMP in a month. Copy of this to Christen Bame, RN  and Baltazar Apo , ,CMA

## 2014-10-23 NOTE — Telephone Encounter (Signed)
Spoke with patient who states he needs refill of Torsemide due to taking extra 1/2 tab 3-4 times per week as advised by Dr. Acie Fredrickson.  I refilled medication with these instructions and advised him that bmet is needed at next office visit on 11/23 in [place].  He verbalized understanding and agreement and thanked me for the call.

## 2014-10-23 NOTE — Telephone Encounter (Signed)
Spoke with pharmacy and was made aware that the patient has eleven refills on file. Called the patient to make him aware of this and he stated that per Dr Acie Fredrickson, he was fine to take an extra half if needed. He stated that three to four times a week, he is having to do this. Please advise. Thanks, MI

## 2014-10-30 ENCOUNTER — Other Ambulatory Visit: Payer: Self-pay

## 2014-10-30 ENCOUNTER — Encounter (INDEPENDENT_AMBULATORY_CARE_PROVIDER_SITE_OTHER): Payer: Medicare Other | Admitting: Ophthalmology

## 2014-10-30 DIAGNOSIS — H348322 Tributary (branch) retinal vein occlusion, left eye, stable: Secondary | ICD-10-CM | POA: Diagnosis not present

## 2014-10-30 DIAGNOSIS — H338 Other retinal detachments: Secondary | ICD-10-CM

## 2014-10-30 DIAGNOSIS — H35033 Hypertensive retinopathy, bilateral: Secondary | ICD-10-CM | POA: Diagnosis not present

## 2014-10-30 DIAGNOSIS — H43813 Vitreous degeneration, bilateral: Secondary | ICD-10-CM | POA: Diagnosis not present

## 2014-10-30 DIAGNOSIS — H35371 Puckering of macula, right eye: Secondary | ICD-10-CM

## 2014-10-30 DIAGNOSIS — I10 Essential (primary) hypertension: Secondary | ICD-10-CM | POA: Diagnosis not present

## 2014-10-30 DIAGNOSIS — D3131 Benign neoplasm of right choroid: Secondary | ICD-10-CM

## 2014-10-30 NOTE — Patient Outreach (Signed)
[place] [facility]) Care Management  10/30/2014  RANGER MIDURA 13-Jul-1940 [id number]   Referral Date:  10/22/14 Referral Source:  [facility] Tier 4 Referral Issue:  CHF, CKD, DM, HTN with 2 admissions  Outreach call #1 to patient for [facility] screening.  Patient not reached.  RN CM left HIPAA compliant voice message with name and number for call back.  RN CM will reschedule for next outreach call within 1-2 weeks.   Mariann Laster, RN, BSN, [facility], CCM  [employer] Management Coordinator [phone] Direct [phone] Cell [phone] Office [phone] Fax

## 2014-10-31 ENCOUNTER — Telehealth: Payer: Self-pay | Admitting: Pulmonary Disease

## 2014-10-31 NOTE — Telephone Encounter (Signed)
Attempted to call pt [place] twice and then line disconnected Will call back later

## 2014-11-01 ENCOUNTER — Other Ambulatory Visit: Payer: Self-pay

## 2014-11-01 DIAGNOSIS — I5032 Chronic diastolic (congestive) heart failure: Secondary | ICD-10-CM

## 2014-11-01 NOTE — Telephone Encounter (Signed)
Called spoke with pt. He reports his breathing is getting worse and feel his health is not doing well. He reports he received a call from [facility] that they want to run another test (but does not know the name of test) that involved many physicians in the room.  Pt wants to know if Dr. Lake Bells has heard from anyone at [facility]. Please advise thanks

## 2014-11-01 NOTE — Telephone Encounter (Signed)
As above.

## 2014-11-01 NOTE — Telephone Encounter (Signed)
Called and spoke with Camila at [place] failure clinic  She will speak with nurse and have someone call the pt with appt to see Dr [name] Pt is aware of this  I will forward to [facility] to f/u on this and be sure pt is contacted with an appt

## 2014-11-01 NOTE — Telephone Encounter (Signed)
I have spoken to Dr. Tempie Hoist locally and Dr. Gilles Chiquito in [place].  [name] is going to discuss her case with Dr. Burt Knack (both cardiologists).  They want to see him in clinic.  Please help arrange a follow up appointment with [name] in clinic ASAP.

## 2014-11-01 NOTE — Patient Outreach (Signed)
[place] [facility]) Care Management  11/01/2014  [name] Jul 25, 1940 [id number]  Referral Date: 10/22/14 Referral Source: [facility] Tier 4 Referral Issue: CHF, CKD, DM, HTN with 2 admissions  Outreach call #2 to patient.  Patient reached and [facility] Screening Assessment completed.   Primary MD:  Dr. [name] - last appt 09/27/2014 - next appt 11/02/14  Cardiologist:  Dr. Cathie Olden:  Last appt about 6 weeks ago. Next appt 11/28/14.  Pulmonologist:  Juanito Doom, MD - last appt 10/15/2014  Social:   Patient lives in his home with his wife of 29 years.   Mobility:  Ambulates with oxygen to manage shortness of breath but requires no other assistance.   Falls:  None Caregiver: wife/Rita Specht. DME:  BP cuff, scales, Oxygen ([facility]), Cpap ([employer]).   CHF  Patient states he has shortness of breath requiring 2 L at rest and 3-4 L with ambulation.   States he also has a heart shut.  States he had a past right heart catheterization and was seen by Dr. Mahalia Longest once.  Patient completed evaluation at [facility] 09/06/14.  Patient states he is still waiting on update regarding a possible treatment plan and / or surgery plan.   Patient states he has understanding that he may needs one more test before decision made about surgery but patient is not able to state the name of the test.  Weight:  198.7  Patient states his last known weight.  States he has not weighed in about 3 days and out of the habit of weighing daily.  (Per Epic: weight 208 on 10/15/14) Patient states his hands and feet are not swollen like they have been in the past but his abdomen is very distended.  States his Cardiologist did not want to complete a paracentesis but his Pulmonologist and GI doctor agreed it would be ok.  Patient states he may need to go to [facility] in [place], [place] due to all the delays with [facility] and his condition continues to worsen.    Consent: Patient agreed to [facility]  services.   Plan: RN CM advised to weigh daily and log; take log to every MD appt.  RN CM encouraged to self monitor BP and log. RN CM advised patient should see Cardiologist prior to scheduled appt currently scheduled for 11/28/14 due to increased abdominal girth.  Patient has pending appt tomorrow 11/02/2014 to see PCP.  RN CM encouraged to keep appt and follow MDs recommendation for Cardiology follow-up.  RN CM will follow-up with [facility] to advocate for patient to receive update following 09/06/14 appt. RN CM will schedule for next contact call within 30 days for Initial Assessment.   RN CM sent referral for [place].   -10 medications  RN CM notified [place] Assistant: agreed to services/case opened. RN CM sent successful outreach letter, [facility] Introductory package and consent for service form to patient 11/01/14.  RN CM advised to please notify MD of any changes in condition prior to scheduled appt's.   RN CM provided contact name and # [phone] or main office # [phone] and 24-hour nurse line # [phone].  RN CM confirmed patient is aware of 911 services for urgent emergency needs. (especially difficulty breathing)  Mariann Laster, RN, BSN, [facility], [place] Management Care Management Coordinator [phone] Direct [phone] Cell [phone] Office [phone] Fax

## 2014-11-02 ENCOUNTER — Ambulatory Visit (INDEPENDENT_AMBULATORY_CARE_PROVIDER_SITE_OTHER): Payer: Medicare Other | Admitting: Internal Medicine

## 2014-11-02 ENCOUNTER — Encounter: Payer: Self-pay | Admitting: Internal Medicine

## 2014-11-02 ENCOUNTER — Other Ambulatory Visit: Payer: Self-pay

## 2014-11-02 VITALS — BP 102/60 | HR 76 | Temp 98.2°F | Resp 18 | Ht 71.0 in | Wt 213.0 lb

## 2014-11-02 DIAGNOSIS — Z9889 Other specified postprocedural states: Secondary | ICD-10-CM

## 2014-11-02 DIAGNOSIS — Z8679 Personal history of other diseases of the circulatory system: Secondary | ICD-10-CM | POA: Diagnosis not present

## 2014-11-02 DIAGNOSIS — Z79899 Other long term (current) drug therapy: Secondary | ICD-10-CM

## 2014-11-02 DIAGNOSIS — Z7901 Long term (current) use of anticoagulants: Secondary | ICD-10-CM | POA: Diagnosis not present

## 2014-11-02 LAB — CBC WITH DIFFERENTIAL/PLATELET
BASOS PCT: 0 % (ref 0–1)
Basophils Absolute: 0 10*3/uL (ref 0.0–0.1)
EOS ABS: 0.1 10*3/uL (ref 0.0–0.7)
Eosinophils Relative: 1 % (ref 0–5)
HCT: 29 % — ABNORMAL LOW (ref 39.0–52.0)
Hemoglobin: 9.2 g/dL — ABNORMAL LOW (ref 13.0–17.0)
Lymphocytes Relative: 10 % — ABNORMAL LOW (ref 12–46)
Lymphs Abs: 0.7 10*3/uL (ref 0.7–4.0)
MCH: 29.3 pg (ref 26.0–34.0)
MCHC: 31.7 g/dL (ref 30.0–36.0)
MCV: 92.4 fL (ref 78.0–100.0)
MONO ABS: 0.5 10*3/uL (ref 0.1–1.0)
MONOS PCT: 7 % (ref 3–12)
MPV: 8.4 fL — ABNORMAL LOW (ref 8.6–12.4)
NEUTROS ABS: 5.9 10*3/uL (ref 1.7–7.7)
Neutrophils Relative %: 82 % — ABNORMAL HIGH (ref 43–77)
PLATELETS: 225 10*3/uL (ref 150–400)
RBC: 3.14 MIL/uL — AB (ref 4.22–5.81)
RDW: 15.8 % — AB (ref 11.5–15.5)
WBC: 7.2 10*3/uL (ref 4.0–10.5)

## 2014-11-02 LAB — COMPREHENSIVE METABOLIC PANEL
ALBUMIN: 4.2 g/dL (ref 3.6–5.1)
ALK PHOS: 194 U/L — AB (ref 40–115)
ALT: 20 U/L (ref 9–46)
AST: 22 U/L (ref 10–35)
BILIRUBIN TOTAL: 1.1 mg/dL (ref 0.2–1.2)
BUN: 41 mg/dL — ABNORMAL HIGH (ref 7–25)
CALCIUM: 8.9 mg/dL (ref 8.6–10.3)
CO2: 28 mmol/L (ref 20–31)
Chloride: 103 mmol/L (ref 98–110)
Creat: 1.57 mg/dL — ABNORMAL HIGH (ref 0.70–1.18)
GLUCOSE: 181 mg/dL — AB (ref 65–99)
POTASSIUM: 4.4 mmol/L (ref 3.5–5.3)
Sodium: 144 mmol/L (ref 135–146)
TOTAL PROTEIN: 7.1 g/dL (ref 6.1–8.1)

## 2014-11-02 NOTE — Patient Outreach (Signed)
[place] [facility]) Care Management  11/02/2014  KERMITT MARCELLO [birth year] [id number]   Request from Mariann Laster, RN to assign Pharmacy, assigned Harlow Asa, PharmD.  Thanks, [name]. [place], [place] Assistant Phone: [phone] Fax: [phone]

## 2014-11-02 NOTE — Telephone Encounter (Signed)
appt has been scheduled for 11/08/14 w/ [name].  Called spoke with pt. He has been made aware of appt. nothing further needed

## 2014-11-02 NOTE — Patient Outreach (Signed)
[place] [facility]) Care Management  11/02/2014  [name] 06-17-1940 [id number]   Outreach call to [facility] Dr. Alisia Ferrari (Pulmonary/Vascular) [address] [place], [place] [address]  [phone]  Contact: Masaji Billups gives contact # for Dr. Gilles Chiquito [phone]. (Call transferred to contact: Prentiss Bells) Prentiss Bells states Dr. Sharion Balloon patients not covered at this clinic and directed RN CM back to [facility] Operator [phone]. Operator was able to provide Dr. Sharion Balloon office phone [phone] and fax # [phone].  Call unsuccessful after waiting 15-20 minutes.   Issue:  Patient waiting on call back regarding treatment recommendations for treatment following 09/06/2014 appt.   Plan:  RN CM will attempt communication with MD via fax.  RN CM will attempt e-mail communication to MD at [email or web address] to confirm fax # prior to sending any patient PHI.  Dr. Gilles Chiquito,  Do you have a confidential fax # that I may send patient information to.  Please verify if Fax # [phone] is ok to send patient PHI information to.  Respectfully,  Mariann Laster, RN, BSN, [facility], [place] Management Care Management Coordinator [phone] Direct [phone] Cell  [phone] Office [phone] Fax

## 2014-11-02 NOTE — Progress Notes (Signed)
   Subjective:    Patient ID: [name], male    DOB: Dec 19, 1940, 74 y.o.   MRN: [id number]  HPI  Patient presents to the office for 1 month recheck of dyspnea which has been monitored by both Cardiology and also by pulmonology.  Patient is considering going to [facility] for a second opinion.  He reports that his breathing has deteriorated.  He cannot do any exertional activity without severe shortness of breath.    He reports that he has been having normal blood pressure low to mid 120's/65.  He reports that he has been having lower pressures at home.  He reports that he has been urinating well on his diuretics lately.    He has been having some bleeding from his nose and he reports  Some bleeding from a spot on his arm.   He is taking coumadin 10 mg nightly.      Review of Systems  Respiratory: Positive for cough, shortness of breath and wheezing. Negative for chest tightness.   Genitourinary: Positive for hematuria. Negative for dysuria, urgency, frequency and scrotal swelling.       Objective:   Physical Exam  Constitutional: He is oriented to person, place, and time.  Cardiovascular: Normal rate, regular rhythm, normal heart sounds and intact distal pulses.  Exam reveals no gallop and no friction rub.   No murmur heard. Pulmonary/Chest: Effort normal and breath sounds normal. No respiratory distress. He has no wheezes. He has no rales. He exhibits no tenderness.  On 2 L [place].  Lung sounds distant.    Abdominal: He exhibits distension. He exhibits no mass. There is no tenderness. There is no rebound and no guarding.  Musculoskeletal: Normal range of motion.  Neurological: He is alert and oriented to person, place, and time.  Skin: Skin is warm and dry.  Psychiatric: He has a normal mood and affect. His behavior is normal. Judgment and thought content normal.  Nursing note and vitals reviewed.   Filed Vitals:   11/02/14 1034  BP: 102/60  Pulse: 76  Temp: 98.2 F (36.8 C)   Resp: 18          Assessment & Plan:    1. S/P Maze operation for atrial fibrillation -due to follow-up with [name] next week -breathing declined, likely needs second opinion at [facility]  2. Medication management  - CMP w [place] (STAT/Sunquest-performed on-site) - CBC with Differential/Platelet  3. Long term current use of anticoagulant therapy -cont coumadin - Protime-INR

## 2014-11-03 LAB — PROTIME-INR
INR: 2.35 — ABNORMAL HIGH (ref <1.50)
PROTHROMBIN TIME: 26.1 s — AB (ref 11.6–15.2)

## 2014-11-04 ENCOUNTER — Other Ambulatory Visit: Payer: Self-pay | Admitting: Cardiovascular Disease

## 2014-11-05 ENCOUNTER — Ambulatory Visit: Payer: Medicare Other | Admitting: Thoracic Surgery (Cardiothoracic Vascular Surgery)

## 2014-11-06 ENCOUNTER — Other Ambulatory Visit: Payer: Self-pay

## 2014-11-06 NOTE — Patient Outreach (Addendum)
[place] [facility]) Care Management  11/06/2014  [name] 07-05-1940 [id number]   Care Coordination Note  E-mail confirmation received back from Dr. Gilles Chiquito, [facility] (11/06/14 10:22am)  MD verified [facility] confidential fax # [phone].  Plan: RN CM faxed / Routed Epic Note 11/01/2014 to Dr. Gilles Chiquito. (CHF Note) RN CM request clarification on patient's treatment plan as patient is still waiting for outcome of plan.    Mariann Laster, RN, BSN, [facility], CCM  [employer] Management Coordinator [phone] Direct [phone] Cell [phone] Office [phone] Fax

## 2014-11-06 NOTE — Patient Outreach (Signed)
[place] [facility]) Care Management  11/06/2014  [name] [birth year] [id number]  Care Coordination Note  RN CM faxed / Routed Epic Note 11/01/2014 to Dr. Gilles Chiquito. (CHF Note) RN CM request clarification on patient's treatment plan as patient is still waiting for outcome of plan.    Hi Dr. Gilles Chiquito,   Please review my last patient note on 11/01/14 (Section on CHF).  I need to verify that patient does not have any pending plans for appt's, diagnostic testing or surgery plans.  Patient is under the idea that things are not complete.  I am not sure if this is correct or if patient may not have a clear understanding of what was last discussed with him.   Patient does have a Heart Failure Clinic appt this week in [place] with Dr. Mahalia Longest.   Thanks,   Mariann Laster, RN, BSN, [facility], [place] Management Care Management Coordinator [phone] Direct [phone] Cell [phone] Office [phone] Fax

## 2014-11-07 ENCOUNTER — Other Ambulatory Visit: Payer: Self-pay | Admitting: *Deleted

## 2014-11-07 MED ORDER — GLUCOSE BLOOD VI STRP
ORAL_STRIP | Status: DC
Start: 2014-11-07 — End: 2015-02-08

## 2014-11-08 ENCOUNTER — Encounter (HOSPITAL_COMMUNITY): Payer: Self-pay | Admitting: Internal Medicine

## 2014-11-08 ENCOUNTER — Ambulatory Visit (HOSPITAL_COMMUNITY)
Admission: RE | Admit: 2014-11-08 | Discharge: 2014-11-08 | Disposition: A | Discharge status: Home / Self Care | Payer: Medicare Other | Source: Ambulatory Visit | Attending: Internal Medicine | Admitting: Internal Medicine

## 2014-11-08 VITALS — BP 98/56 | HR 75 | Wt 213.0 lb

## 2014-11-08 DIAGNOSIS — Q264 Anomalous pulmonary venous connection, unspecified: Secondary | ICD-10-CM | POA: Insufficient documentation

## 2014-11-08 DIAGNOSIS — I5033 Acute on chronic diastolic (congestive) heart failure: Secondary | ICD-10-CM

## 2014-11-08 DIAGNOSIS — E1122 Type 2 diabetes mellitus with diabetic chronic kidney disease: Secondary | ICD-10-CM | POA: Insufficient documentation

## 2014-11-08 DIAGNOSIS — G4733 Obstructive sleep apnea (adult) (pediatric): Secondary | ICD-10-CM | POA: Insufficient documentation

## 2014-11-08 DIAGNOSIS — I2510 Atherosclerotic heart disease of native coronary artery without angina pectoris: Secondary | ICD-10-CM | POA: Diagnosis not present

## 2014-11-08 DIAGNOSIS — Z951 Presence of aortocoronary bypass graft: Secondary | ICD-10-CM | POA: Insufficient documentation

## 2014-11-08 DIAGNOSIS — E785 Hyperlipidemia, unspecified: Secondary | ICD-10-CM | POA: Insufficient documentation

## 2014-11-08 DIAGNOSIS — J984 Other disorders of lung: Secondary | ICD-10-CM | POA: Diagnosis not present

## 2014-11-08 DIAGNOSIS — I130 Hypertensive heart and chronic kidney disease with heart failure and stage 1 through stage 4 chronic kidney disease, or unspecified chronic kidney disease: Secondary | ICD-10-CM | POA: Diagnosis not present

## 2014-11-08 DIAGNOSIS — J9610 Chronic respiratory failure, unspecified whether with hypoxia or hypercapnia: Secondary | ICD-10-CM | POA: Diagnosis not present

## 2014-11-08 DIAGNOSIS — I272 Other secondary pulmonary hypertension: Secondary | ICD-10-CM | POA: Diagnosis not present

## 2014-11-08 DIAGNOSIS — N183 Chronic kidney disease, stage 3 (moderate): Secondary | ICD-10-CM | POA: Insufficient documentation

## 2014-11-08 DIAGNOSIS — I5032 Chronic diastolic (congestive) heart failure: Secondary | ICD-10-CM | POA: Diagnosis not present

## 2014-11-08 DIAGNOSIS — Q263 Partial anomalous pulmonary venous connection: Secondary | ICD-10-CM

## 2014-11-08 DIAGNOSIS — J9611 Chronic respiratory failure with hypoxia: Secondary | ICD-10-CM | POA: Diagnosis not present

## 2014-11-08 DIAGNOSIS — K219 Gastro-esophageal reflux disease without esophagitis: Secondary | ICD-10-CM | POA: Insufficient documentation

## 2014-11-08 DIAGNOSIS — R188 Other ascites: Secondary | ICD-10-CM | POA: Diagnosis not present

## 2014-11-08 LAB — CBC
HEMATOCRIT: 30.1 % — AB (ref 39.0–52.0)
HEMOGLOBIN: 9.6 g/dL — AB (ref 13.0–17.0)
MCH: 29.8 pg (ref 26.0–34.0)
MCHC: 31.9 g/dL (ref 30.0–36.0)
MCV: 93.5 fL (ref 78.0–100.0)
PLATELETS: 223 10*3/uL (ref 150–400)
RBC: 3.22 MIL/uL — ABNORMAL LOW (ref 4.22–5.81)
RDW: 16 % — ABNORMAL HIGH (ref 11.5–15.5)
WBC: 8.9 10*3/uL (ref 4.0–10.5)

## 2014-11-08 LAB — BASIC METABOLIC PANEL
ANION GAP: 12 (ref 5–15)
BUN: 29 mg/dL — AB (ref 6–20)
CHLORIDE: 102 mmol/L (ref 101–111)
CO2: 28 mmol/L (ref 22–32)
Calcium: 9.5 mg/dL (ref 8.9–10.3)
Creatinine, Ser: 1.54 mg/dL — ABNORMAL HIGH (ref 0.61–1.24)
GFR calc Af Amer: 49 mL/min — ABNORMAL LOW (ref >60)
GFR, EST NON AFRICAN AMERICAN: 42 mL/min — AB (ref >60)
GLUCOSE: 150 mg/dL — AB (ref 65–99)
POTASSIUM: 3.7 mmol/L (ref 3.5–5.1)
Sodium: 142 mmol/L (ref 135–145)

## 2014-11-08 LAB — PROTIME-INR
INR: 2.28 — ABNORMAL HIGH (ref 0.00–1.49)
PROTHROMBIN TIME: 24.9 s — AB (ref 11.6–15.2)

## 2014-11-08 LAB — BRAIN NATRIURETIC PEPTIDE: B NATRIURETIC PEPTIDE 5: 945.6 pg/mL — AB (ref 0.0–100.0)

## 2014-11-08 MED ORDER — METOLAZONE 5 MG PO TABS: 5.0000 mg | ORAL_TABLET | ORAL | Status: DC

## 2014-11-08 MED ORDER — TORSEMIDE 100 MG PO TABS: 100.0000 mg | ORAL_TABLET | Freq: Every day | ORAL | Status: DC

## 2014-11-08 MED ORDER — POTASSIUM CHLORIDE CRYS ER 20 MEQ PO TBCR: 40.0000 meq | EXTENDED_RELEASE_TABLET | Freq: Two times a day (BID) | ORAL | Status: DC

## 2014-11-08 NOTE — Progress Notes (Signed)
[facility]   Patient ID: [name], male   DOB: 1940-09-25, 74 y.o.   MRN: [id number]   Subjective:   [name] is a 74 y/o male with COPD , DM, PAF, 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, CKD, AFL s/p ablation in 10/15. 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 Had surgery in 4/15. Did well for awhile went to cardiac rehab and was feeling fine. Could do almost anything he wanted to do. Developed AFL in 10/15 and underwent RFA. Felt good.   In 3/16 began to develop severe SOB. Started O2. Says his symptoms got worse almost overnight. 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 He has seen Dr. Roxy Manns but felt to have no optimal surgical options for repair. His case was also presented to Dr. Michaelle Birks at [facility] who agreed that there was no way to baffle or reroute the anomalous PV flow to the LA. He had a TEE which showed LVEF 60-65% with a dilated right side and a small PFO. He has also been seen by Dr. Lake Bells who performed PFTs that showed significant restrictive lung disease with a low DLCO. He had f/u with Dr. Gilles Chiquito in the [facility] who felt his symptoms were multifactorial. Unclear why symptom onset so quick   He presents today to the [facility] Clinic for follow-up. He feels that he is continuing to deteriorate. He feels that his breathing is worse. He is short of breath much faster than before with less activity.  He now gets dyspneic with minimal activityJust today with minimal activity he got dyspneic with minimal activity.  Since august has gained 20 pounds. + severe abdominal bloating. And moderate LE edema. Takes torsemide 50 bid. Takes metolazone about 2x week but can't can't keep up with the fluid gain. Denies CP. No orthopnea or PND.     PFTs (7/16) FEV1 1.45 L (45%) FVC 1.77 L (40%) DLCO 46%  RHC 4/16 RA = 18 RV = 72/4/17 PA = 76/27 (46) PCW = 21 Fick  cardiac output/index (using PA sat) = 9.2/4.45 Thermo CO/CI = 10.0/4.87 PVR = 2.2 WU Fick cardiac output/index (using high SVC sat) = 5.2/2.5 Pulse-ox saturation = 89%  High SVC sat = 54% Low SVC sat = 81% (at SVC/RA junction) RA sat = 68% RV sat = 66% PA sat = 68%, 69% IVC sat =56%   EF 60%. RV dilated with moderately reduced systolic function. RVSP 70. + bubble.  VQ/CT negative for PE   TEE 10/15 small PFO  Ab u/s 6/16 liver normal + ascites. Medico renal kidney disease.   HPI  Past Medical History  Diagnosis Date  . Hx of adenomatous colonic polyps   . Diabetes mellitus type II   . Hypertension   . Diverticulosis 2001  . Hyperlipidemia   . Persistent atrial fibrillation ([place])     a. s/p MAZE 04/2013 in setting of CABG. b. Amio stopped in 10/2013 after flutter ablation.  . Obstructive sleep apnea     compliant with CPAP  . Chronic diastolic congestive heart failure ([place])   . Hypertensive cardiomyopathy ([place])   . History of cardioversion     x3 (years uncertain)  . H/O hiatal hernia   . Basal cell carcinoma   . Adrenal adenoma   . CAD (coronary artery disease)     a. 04/2013 CABG  x 2: LIMA to LAD, SVG to RI, EVH via R thigh.  . S/P Maze operation for atrial fibrillation     a. 04/2013: Complete bilateral atrial lesion set using cryothermy and bipolar radiofrequency ablation with clipping of LA appendage (@ time of CABG)  . GERD (gastroesophageal reflux disease)   . Depression   . Anxiety   . DJD (degenerative joint disease)   . Atypical atrial flutter ([place]) 8/15, 10/15    a. DCCV 08/2013. b. s/p RFA 10/2013.  [name] Respiratory failure ([place])     a. Hypoxia 10/2013 - required supp O2 as inpatient, did not require it at discharge.  . Pleural effusion, left     a. s/p thoracentesis 05/2013.  [name] PFO (patent foramen ovale)     a. Small PFO by TEE 10/2013.  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 S/P Maze operation for atrial fibrillation 04/05/2013    Complete bilateral atrial lesion set using cryothermy and bipolar radiofrequency ablation with clipping of LA appendage via median sternotomy approach      Review of Systems  Constitutional: Positive for fatigue. Negative for diaphoresis.  HENT: Negative for postnasal drip, rhinorrhea and sinus pressure.   Respiratory: Positive for shortness of breath. Negative for cough and wheezing.   Cardiovascular: Negative for chest pain, palpitations and leg swelling.       Objective:   Physical Exam Filed Vitals:   11/08/14 1106  BP: 98/56  Pulse: 75  Weight: 213 lb (96.616 kg)  SpO2: 95%   4L [place]  General:  Chronically ill appearing. No resp difficulty HEENT: normal Neck: supple. JVP to ear Carotids 2+ bilat; no bruits. No lymphadenopathy or thryomegaly appreciated. Cor: PMI nondisplaced. Regular rate & rhythm. 2/6 TR Lungs: clear with decreased BS throughout Abdomen: soft, nontender, + markedly distended. No hepatosplenomegaly. No bruits or masses. Good bowel sounds. Extremities: no cyanosis, clubbing, rash, 2+ edema Neuro: alert & orientedx3, cranial nerves grossly intact. moves all 4 extremities w/o difficulty. Affect pleasant     Assessment & Plan:   1. 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 RV failure 3. Chronic respiratory failure 4. Pulmonary HTN 5. 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 Ascites 7. CAD s/p CABG 8. AF s/ Maze.  9. AFL s/p ablation 10/15 10. CKD, stage 3  Plan/Discussion:   I agree with Dr. Gilles Chiquito, I suspect his respiratory failure is multifactorial and his decompensation over the past few months is related to the fact that he has hit a tipping point.There does not appear to be any reasonable fix for his rerouting his anomalous PV and thus I suspect the best way to proceed is to attempt to optimize his volume status and any  other factors we can fix.   We will:  1) Increase torsemide to 100mg  bid 2) Increase potassium to 40mg  twice a day. 3) take metolazone every other day. 4) schedule paracentesis 5) refer to pulmonary rehab  See back in 1 week.   Total time spent 45 minutes. Over half that time spent discussing above.   Bensimhon, Daniel,MD 1:31 PM

## 2014-11-08 NOTE — Patient Instructions (Signed)
INCREASE Torsemide to 100mg  twice a day.  INCREASE Potassium to 40mg  twice a day.  TAKE Metolazone every other day.  Your provider scheduled you for a Paracentesis : Friday 11/09/2014 at 2pm please arrive at 1:45pm  Address: [place]. (1st floor radiology)

## 2014-11-09 ENCOUNTER — Ambulatory Visit (HOSPITAL_COMMUNITY)
Admission: RE | Admit: 2014-11-09 | Discharge: 2014-11-09 | Disposition: A | Discharge status: Home / Self Care | Payer: Medicare Other | Source: Ambulatory Visit | Attending: Internal Medicine | Admitting: Internal Medicine

## 2014-11-09 DIAGNOSIS — R188 Other ascites: Secondary | ICD-10-CM | POA: Insufficient documentation

## 2014-11-09 DIAGNOSIS — I429 Cardiomyopathy, unspecified: Secondary | ICD-10-CM | POA: Diagnosis not present

## 2014-11-09 DIAGNOSIS — I5032 Chronic diastolic (congestive) heart failure: Secondary | ICD-10-CM

## 2014-11-09 DIAGNOSIS — I509 Heart failure, unspecified: Secondary | ICD-10-CM | POA: Diagnosis not present

## 2014-11-09 NOTE — Procedures (Signed)
US guided therapeutic paracentesis performed yielding 4.1 L slightly hazy, yellow fluid. No immediate complications.

## 2014-11-11 DIAGNOSIS — I2510 Atherosclerotic heart disease of native coronary artery without angina pectoris: Secondary | ICD-10-CM | POA: Diagnosis not present

## 2014-11-11 DIAGNOSIS — I480 Paroxysmal atrial fibrillation: Secondary | ICD-10-CM | POA: Diagnosis not present

## 2014-11-12 ENCOUNTER — Other Ambulatory Visit: Payer: Self-pay | Admitting: Pharmacist

## 2014-11-12 ENCOUNTER — Other Ambulatory Visit (HOSPITAL_COMMUNITY): Payer: Self-pay | Admitting: *Deleted

## 2014-11-12 MED ORDER — METOLAZONE 5 MG PO TABS: 5.0000 mg | ORAL_TABLET | ORAL | Status: DC

## 2014-11-12 NOTE — Patient Outreach (Signed)
[facility]) Care Management  [place]   11/12/2014  [name] 1940-09-17 [id number]  Subjective: [name] is a 74 y.o. male referred to pharmacy for medication review, as patient is taking greater than 10 medications. Reviewed patient's medications as noted below. Note per EPIC record, most recent eGFR of 42 ml/min on 11/08/14.  Called to follow up with patient following this medication review. Patient reports that he just had a paracentesis last week, during which he had about a gallon of fluid removed. Reports that his breathing is much better as a result. Reports that his fasting blood sugars have been running a bit lower than normal, between about 80 and 105 mg/dL. However, patient reports that he has not had any lows. Review with patient signs of and how to treat low blood sugars. Patient reports that he sees his PCP every month when he has his INR checked. Advise patient for his next appointment to bring his blood sugar log with him for his PCP to review. Reports that, as directed, he is taking his half dose of glimepiride since his fasting blood sugars have been below 120 mg/dL.  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 Discuss with [name] the potential diphenhydramine interaction with both his alprazolam and potassium. Mr. Tosh states that he very rarely uses either alprazolam or diphenhydramine. However, he does verbalize understanding of these potential interactions. States that will take this into consideration and reports that he has not fallen within the past year, reports that he cannot remember the last time that he had a fall.  Provide [name] with my contact information in case he has any questions in the future.  Objective:   Current Medications: Current Outpatient Prescriptions  Medication Sig Dispense Refill  . ALPRAZolam (XANAX) 1 MG tablet Take 0.5 mg by mouth at bedtime as needed for sleep.     [name]  atorvastatin (LIPITOR) 40 MG tablet Take 20 mg by mouth every Monday, Wednesday, and Friday.     . B Complex Vitamins (VITAMIN B COMPLEX PO) Take 1 tablet by mouth daily.    [name] Besifloxacin HCl 0.6 % SUSP Place 1 drop into the left eye See admin instructions. Use 1.5 days a month starting the day after get injection into the eye    . carvedilol (COREG) 6.25 MG tablet Take 6.25 mg by mouth 2 (two) times daily with a meal.    . cholecalciferol (VITAMIN D) 1000 UNITS tablet Take 4,000-5,000 Units by mouth See admin instructions. Take 4 tablets four times a week and take 5 tablets three times a week    . diphenhydrAMINE (BENADRYL) 25 MG tablet Take 25 mg by mouth at bedtime as needed for allergies.     [name] glimepiride (AMARYL) 2 MG tablet Take 1 mg by mouth See admin instructions. Take 1/2 tablet daily unless blood sugar is > 120    . glucose blood (ACCU-CHEK AVIVA PLUS) test strip Check blood glucose 1 time daily.   DX-E11.29 100 each 12  . HYDROcodone-acetaminophen (NORCO/VICODIN) 5-325 MG per tablet Take 1 tablet by mouth every 6 (six) hours as needed for moderate pain. (Patient not taking: Reported on 11/08/2014) 30 tablet   . metFORMIN (GLUCOPHAGE-XR) 500 MG 24 hr tablet Take 2 tablets (1,000 mg total) by mouth 2 (two) times daily. 120 tablet 4  . metolazone (ZAROXOLYN) 5 MG tablet Take 1 tablet (5 mg total) by mouth every other day.  Take 1 pill every 3 days 30 minutes before Demadex 15 tablet 11  . minoxidil (LONITEN) 10 MG tablet Take 2 tablets (20 mg total) by mouth at bedtime. 90 tablet 3  . Multiple Vitamin (MULTIVITAMIN WITH MINERALS) TABS tablet Take 1 tablet by mouth daily.    . OXYGEN Inhale 2 L into the lungs continuous.    . potassium chloride SA (K-DUR,KLOR-CON) 20 MEQ tablet Take 2 tablets (40 mEq total) by mouth 2 (two) times daily. 120 tablet 3  . ranitidine (ZANTAC) 150 MG tablet Take 150 mg by mouth daily as needed for heartburn.     . sertraline (ZOLOFT) 100 MG tablet TAKE 1 TABLET BY  MOUTH DAILY 30 tablet 11  . torsemide (DEMADEX) 100 MG tablet Take 1 tablet (100 mg total) by mouth daily. 60 tablet 11  . valsartan (DIOVAN) 320 MG tablet Take 1 tablet (320 mg total) by mouth daily. 90 tablet 3  . warfarin (COUMADIN) 5 MG tablet Take 10 mg by mouth daily after supper.      No current facility-administered medications for this visit.    Functional Status: In your present state of health, do you have any difficulty performing the following activities: 05/03/2014 01/30/2014  Hearing? N Y  Vision? N N  Difficulty concentrating or making decisions? N N  Walking or climbing stairs? Y N  Dressing or bathing? Y N  Doing errands, shopping? - N    Fall/Depression Screening: PHQ 2/9 Scores 11/01/2014 01/30/2014 09/27/2013 09/26/2013 05/31/2013  PHQ - 2 Score 0 0 0 0 0    Assessment:  Drugs sorted by system:  Neurologic/Psychologic: alprazolam, sertraline,   Cardiovascular: atorvastatin, carvedilol, metolazone, minoxidil, torsemide, valsartan, warfarin  Pulmonary/Allergy: diphenhydramine  Gastrointestinal: ranitidine  Endocrine: glimepiride, metformin  Renal: none  Topical: none  Pain: none  Vitamins/Minerals: B Complex Vitamins, cholecalciferol, multiple vitamin, potassium chloride  Infectious Diseases: none  Miscellaneous: none   Duplications in therapy: none noted  Gaps in therapy: none noted  Medications to avoid in the elderly: alprazolam and diphenhydramine, sedating medications of particular concern in patients with dementia, cognitive impairment or history of falls.  Drug interactions: . Diphenhydramine and potassium: Anticholinergic Agents may enhance the ulcerogenic effect of potassium chloride . Alprazolam and diphenhydramine: CNS Depressants may enhance the adverse/toxic effect of other CNS Depressants Other issues noted: none noted  Plan:  Call patient and discuss potential diphenhydramine interaction with both his alprazolam and potassium.  Discuss with patient alternative medication options for managing his allergy symptoms. 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 States that he very rarely uses either alprazolam or diphenhydramine. However, he does verbalize understanding of these potential interactions.   Harlow Asa, PharmD Clinical Pharmacist [place] Management [phone]

## 2014-11-14 ENCOUNTER — Telehealth (HOSPITAL_COMMUNITY): Payer: Self-pay

## 2014-11-14 ENCOUNTER — Ambulatory Visit (HOSPITAL_COMMUNITY)
Admission: RE | Admit: 2014-11-14 | Discharge: 2014-11-14 | Disposition: A | Discharge status: Home / Self Care | Payer: Medicare Other | Source: Ambulatory Visit | Attending: Internal Medicine | Admitting: Internal Medicine

## 2014-11-14 VITALS — BP 84/46 | HR 76 | Wt 196.4 lb

## 2014-11-14 DIAGNOSIS — I5032 Chronic diastolic (congestive) heart failure: Secondary | ICD-10-CM | POA: Diagnosis not present

## 2014-11-14 DIAGNOSIS — Z7984 Long term (current) use of oral hypoglycemic drugs: Secondary | ICD-10-CM | POA: Diagnosis not present

## 2014-11-14 DIAGNOSIS — J9611 Chronic respiratory failure with hypoxia: Secondary | ICD-10-CM | POA: Diagnosis not present

## 2014-11-14 DIAGNOSIS — I130 Hypertensive heart and chronic kidney disease with heart failure and stage 1 through stage 4 chronic kidney disease, or unspecified chronic kidney disease: Secondary | ICD-10-CM | POA: Insufficient documentation

## 2014-11-14 DIAGNOSIS — Z79899 Other long term (current) drug therapy: Secondary | ICD-10-CM | POA: Diagnosis not present

## 2014-11-14 DIAGNOSIS — J9610 Chronic respiratory failure, unspecified whether with hypoxia or hypercapnia: Secondary | ICD-10-CM | POA: Insufficient documentation

## 2014-11-14 DIAGNOSIS — E1122 Type 2 diabetes mellitus with diabetic chronic kidney disease: Secondary | ICD-10-CM | POA: Insufficient documentation

## 2014-11-14 DIAGNOSIS — N183 Chronic kidney disease, stage 3 (moderate): Secondary | ICD-10-CM | POA: Diagnosis not present

## 2014-11-14 DIAGNOSIS — I2510 Atherosclerotic heart disease of native coronary artery without angina pectoris: Secondary | ICD-10-CM | POA: Diagnosis not present

## 2014-11-14 DIAGNOSIS — Z951 Presence of aortocoronary bypass graft: Secondary | ICD-10-CM | POA: Insufficient documentation

## 2014-11-14 DIAGNOSIS — Q262 Total anomalous pulmonary venous connection: Secondary | ICD-10-CM | POA: Diagnosis not present

## 2014-11-14 DIAGNOSIS — R188 Other ascites: Secondary | ICD-10-CM | POA: Diagnosis not present

## 2014-11-14 DIAGNOSIS — J984 Other disorders of lung: Secondary | ICD-10-CM

## 2014-11-14 DIAGNOSIS — I272 Other secondary pulmonary hypertension: Secondary | ICD-10-CM | POA: Insufficient documentation

## 2014-11-14 DIAGNOSIS — E785 Hyperlipidemia, unspecified: Secondary | ICD-10-CM | POA: Diagnosis not present

## 2014-11-14 DIAGNOSIS — K219 Gastro-esophageal reflux disease without esophagitis: Secondary | ICD-10-CM | POA: Diagnosis not present

## 2014-11-14 DIAGNOSIS — I5033 Acute on chronic diastolic (congestive) heart failure: Secondary | ICD-10-CM

## 2014-11-14 DIAGNOSIS — Z7901 Long term (current) use of anticoagulants: Secondary | ICD-10-CM | POA: Insufficient documentation

## 2014-11-14 DIAGNOSIS — G4733 Obstructive sleep apnea (adult) (pediatric): Secondary | ICD-10-CM | POA: Insufficient documentation

## 2014-11-14 DIAGNOSIS — F329 Major depressive disorder, single episode, unspecified: Secondary | ICD-10-CM | POA: Insufficient documentation

## 2014-11-14 DIAGNOSIS — F419 Anxiety disorder, unspecified: Secondary | ICD-10-CM | POA: Diagnosis not present

## 2014-11-14 LAB — BASIC METABOLIC PANEL
ANION GAP: 10 (ref 5–15)
BUN: 33 mg/dL — ABNORMAL HIGH (ref 6–20)
CALCIUM: 9.2 mg/dL (ref 8.9–10.3)
CHLORIDE: 97 mmol/L — AB (ref 101–111)
CO2: 32 mmol/L (ref 22–32)
Creatinine, Ser: 1.63 mg/dL — ABNORMAL HIGH (ref 0.61–1.24)
GFR calc Af Amer: 46 mL/min — ABNORMAL LOW (ref >60)
GFR calc non Af Amer: 40 mL/min — ABNORMAL LOW (ref >60)
GLUCOSE: 272 mg/dL — AB (ref 65–99)
Potassium: 3.4 mmol/L — ABNORMAL LOW (ref 3.5–5.1)
Sodium: 139 mmol/L (ref 135–145)

## 2014-11-14 MED ORDER — SPIRONOLACTONE 25 MG PO TABS: 25.0000 mg | ORAL_TABLET | Freq: Every day | ORAL | Status: DC

## 2014-11-14 MED ORDER — TORSEMIDE 10 MG PO TABS: ORAL_TABLET | ORAL | Status: DC

## 2014-11-14 MED ORDER — POTASSIUM CHLORIDE CRYS ER 20 MEQ PO TBCR: 40.0000 meq | EXTENDED_RELEASE_TABLET | Freq: Every day | ORAL | Status: DC

## 2014-11-14 NOTE — Addendum Note (Signed)
Encounter addended by: Patton Salles, RN on: 11/14/2014  3:02 PM<BR>     Documentation filed: Patient Instructions Section

## 2014-11-14 NOTE — Progress Notes (Signed)
Patient ID: YAKOV LAUGHTON, male   DOB: 03-25-1940, 74 y.o.   MRN: [id number]  [facility]   Patient ID: BODE BRYANT, male   DOB: 01-09-1940, 74 y.o.   MRN: [id number]   Subjective:   Jeronimo is a 74 y/o male with COPD , DM, PAF, 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, CKD, AFL s/p ablation in 10/15. 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 Had surgery in 4/15. Did well for awhile went to cardiac rehab and was feeling fine. Could do almost anything he wanted to do. Developed AFL in 10/15 and underwent RFA. Felt good.   In 3/16 began to develop severe SOB. Started O2. Says his symptoms got worse almost overnight. 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 He has seen Dr. Roxy Manns but felt to have no optimal surgical options for repair. His case was also presented to Dr. Michaelle Birks at [facility] who agreed that there was no way to baffle or reroute the anomalous PV flow to the LA. He had a TEE which showed LVEF 60-65% with a dilated right side and a small PFO. He has also been seen by Dr. Lake Bells who performed PFTs that showed significant restrictive lung disease with a low DLCO. He had f/u with Dr. Gilles Chiquito in the [facility] who felt his symptoms were multifactorial. Unclear why symptom onset so quick   He presents today to the HF Clinic for follow-up. At last visit we increased his torsemide to 100 bid and added metolazone qod. We also referred him for paracentesis which was done on 11/4 with 4.1 L off. Now taking torsemide once daily and metolazone prn. Weight down 17 pounds. Feels so much better after paracentesis. Has cut back demadex to 100 daily with 50 or 100 at night depending on what ankles look like. Takes metolazone prn. Breathing much better. Appetite much improved. Says he feels like he did in March prior to his surgery. Has cut his valsartan to 160 daily due to low  BP.    PFTs (7/16) FEV1 1.45 L (45%) FVC 1.77 L (40%) DLCO 46%  RHC 4/16 RA = 18 RV = 72/4/17 PA = 76/27 (46) PCW = 21 Fick cardiac output/index (using PA sat) = 9.2/4.45 Thermo CO/CI = 10.0/4.87 PVR = 2.2 WU Fick cardiac output/index (using high SVC sat) = 5.2/2.5 Pulse-ox saturation = 89%  High SVC sat = 54% Low SVC sat = 81% (at SVC/RA junction) RA sat = 68% RV sat = 66% PA sat = 68%, 69% IVC sat =56%   EF 60%. RV dilated with moderately reduced systolic function. RVSP 70. + bubble.  VQ/CT negative for PE   TEE 10/15 small PFO  Ab u/s 6/16 liver normal + ascites. Medico renal kidney disease.   HPI  Past Medical History  Diagnosis Date  . Hx of adenomatous colonic polyps   . Diabetes mellitus type II   . Hypertension   . Diverticulosis 2001  . Hyperlipidemia   . Persistent atrial fibrillation ([place])     a. s/p MAZE 04/2013 in setting of CABG. b. Amio stopped in 10/2013 after flutter ablation.  . Obstructive sleep apnea     compliant with CPAP  . Chronic diastolic congestive heart failure ([place])   . Hypertensive cardiomyopathy ([place])   . History of cardioversion     x3 (years uncertain)  .  H/O hiatal hernia   . Basal cell carcinoma   . Adrenal adenoma   . CAD (coronary artery disease)     a. 04/2013 CABG x 2: LIMA to LAD, SVG to RI, EVH via R thigh.  . S/P Maze operation for atrial fibrillation     a. 04/2013: Complete bilateral atrial lesion set using cryothermy and bipolar radiofrequency ablation with clipping of LA appendage (@ time of CABG)  . GERD (gastroesophageal reflux disease)   . Depression   . Anxiety   . DJD (degenerative joint disease)   . Atypical atrial flutter ([place]) 8/15, 10/15    a. DCCV 08/2013. b. s/p RFA 10/2013.  [name] Respiratory failure ([place])     a. Hypoxia 10/2013 - required supp O2 as inpatient, did not require it at discharge.  . Pleural effusion, left     a. s/p thoracentesis 05/2013.  [name] PFO (patent foramen ovale)     a. Small  PFO by TEE 10/2013.  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 S/P Maze operation for atrial fibrillation 04/05/2013    Complete bilateral atrial lesion set using cryothermy and bipolar radiofrequency ablation with clipping of LA appendage via median sternotomy approach    Current Outpatient Prescriptions on File Prior to Encounter  Medication Sig Dispense Refill  . ALPRAZolam (XANAX) 1 MG tablet Take 0.5 mg by mouth at bedtime as needed for sleep.     [name] atorvastatin (LIPITOR) 40 MG tablet Take 20 mg by mouth every Monday, Wednesday, and Friday.     . B Complex Vitamins (VITAMIN B COMPLEX PO) Take 1 tablet by mouth daily.    [name] Besifloxacin HCl 0.6 % SUSP Place 1 drop into the left eye See admin instructions. Use 1.5 days a month starting the day after get injection into the eye    . carvedilol (COREG) 6.25 MG tablet Take 6.25 mg by mouth 2 (two) times daily with a meal.    . cholecalciferol (VITAMIN D) 1000 UNITS tablet Take 4,000-5,000 Units by mouth See admin instructions. Take 4 tablets four times a week and take 5 tablets three times a week    . diphenhydrAMINE (BENADRYL) 25 MG tablet Take 25 mg by mouth at bedtime as needed for allergies.     [name] glimepiride (AMARYL) 2 MG tablet Take 1 mg by mouth See admin instructions. Take 1/2 tablet daily unless blood sugar is > 120    . glucose blood (ACCU-CHEK AVIVA PLUS) test strip Check blood glucose 1 time daily.   DX-E11.29 100 each 12  . HYDROcodone-acetaminophen (NORCO/VICODIN) 5-325 MG per tablet Take 1 tablet by mouth every 6 (six) hours as needed for moderate pain. 30 tablet   . metFORMIN (GLUCOPHAGE-XR) 500 MG 24 hr tablet Take 2 tablets (1,000 mg total) by mouth 2 (two) times daily. 120 tablet 4  . minoxidil (LONITEN) 10 MG tablet Take 2 tablets (20 mg total) by mouth at bedtime. 90 tablet 3  . Multiple Vitamin (MULTIVITAMIN WITH  MINERALS) TABS tablet Take 1 tablet by mouth daily.    . OXYGEN Inhale 2 L into the lungs continuous.    . potassium chloride SA (K-DUR,KLOR-CON) 20 MEQ tablet Take 2 tablets (40 mEq total) by mouth 2 (two) times daily. 120 tablet 3  . ranitidine (ZANTAC) 150 MG tablet Take 150 mg by mouth daily as needed for heartburn.     . sertraline (  ZOLOFT) 100 MG tablet TAKE 1 TABLET BY MOUTH DAILY 30 tablet 11  . torsemide (DEMADEX) 100 MG tablet Take 1 tablet (100 mg total) by mouth daily. 60 tablet 11  . valsartan (DIOVAN) 320 MG tablet Take 1 tablet (320 mg total) by mouth daily. (Patient taking differently: Take 160 mg by mouth daily. ) 90 tablet 3  . warfarin (COUMADIN) 5 MG tablet Take 10 mg by mouth daily after supper.      No current facility-administered medications on file prior to encounter.     Review of Systems  Constitutional: Positive for fatigue. Negative for diaphoresis.  HENT: Negative for postnasal drip, rhinorrhea and sinus pressure.   Respiratory: Positive for shortness of breath. Negative for cough and wheezing.   Cardiovascular: Negative for chest pain, palpitations and leg swelling.       Objective:   Physical Exam Filed Vitals:   11/14/14 1357  BP: 84/46  Pulse: 76  Weight: 196 lb 6.4 oz (89.086 kg)  SpO2: 96%   4L [place]  General:  Chronically ill appearing. No resp difficulty HEENT: normal Neck: supple. JVP to ear Carotids 2+ bilat; no bruits. No lymphadenopathy or thryomegaly appreciated. Cor: PMI nondisplaced. Regular rate & rhythm. 2/6 TR Lungs: clear with decreased BS throughout Abdomen: soft, nontender, + distended. No hepatosplenomegaly. No bruits or masses. Good bowel sounds. Extremities: no cyanosis, clubbing, rash, edema Neuro: alert & orientedx3, cranial nerves grossly intact. moves all 4 extremities w/o difficulty. Affect pleasant     Assessment & Plan:   1. 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 RV failure 3. Chronic respiratory failure 4.  Pulmonary HTN 5. 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 Ascites 7. CAD s/p CABG 8. AF s/ Maze.  9. AFL s/p ablation 10/15 10. CKD, stage 3 11. HTN  Plan/Discussion:   Volume status and symptoms much improved particularly with paracentesis but still with fluid on board. Will add spironolactone 25 daily and change torsemide to 100/50 (and he can continue to adjust as needed). Drop KCL to 40 meq daily. Use metolazone prn. Can also refer for repeat paracentesis as needed. Consider increase spiro to 25 bid at next visit. Refer to pulmonary rehab. Check labs today and in 1 week. Will stop Minoxidil.   See back in 2-3 weeks.    Bensimhon, Daniel,MD 2:12 PM

## 2014-11-14 NOTE — Telephone Encounter (Signed)
[facility] called to verify Torsemide dose

## 2014-11-14 NOTE — Patient Instructions (Addendum)
Decrease Torsemide 100 mg in the am and 50 mg in the evening  Start Spironolactone 25 mg daily  Decrease Potassium 40 meq daily  Stop Monoxdil  Labs today will call if abnormal   Recheck labs in one week  Refer to pulmonary rehab  Follow up 2-3 weeks

## 2014-11-14 NOTE — Addendum Note (Signed)
Encounter addended by: Patton Salles, RN on: 11/14/2014  2:44 PM<BR>     Documentation filed: Medications, Visit Diagnoses, Dx Association, Patient Instructions Section, Orders

## 2014-11-15 ENCOUNTER — Encounter: Payer: Self-pay | Admitting: Internal Medicine

## 2014-11-19 ENCOUNTER — Other Ambulatory Visit: Payer: Self-pay | Admitting: Physician Assistant

## 2014-11-20 ENCOUNTER — Other Ambulatory Visit: Payer: Self-pay | Admitting: *Deleted

## 2014-11-20 MED ORDER — ACCU-CHEK AVIVA PLUS W/DEVICE KIT: PACK | Status: DC

## 2014-11-21 ENCOUNTER — Ambulatory Visit (HOSPITAL_COMMUNITY)
Admission: RE | Admit: 2014-11-21 | Discharge: 2014-11-21 | Disposition: A | Discharge status: Home / Self Care | Payer: Medicare Other | Source: Ambulatory Visit | Attending: Cardiology | Admitting: Cardiology

## 2014-11-21 DIAGNOSIS — I272 Other secondary pulmonary hypertension: Secondary | ICD-10-CM | POA: Diagnosis not present

## 2014-11-21 LAB — BASIC METABOLIC PANEL
ANION GAP: 7 (ref 5–15)
BUN: 27 mg/dL — AB (ref 6–20)
CALCIUM: 9.2 mg/dL (ref 8.9–10.3)
CO2: 33 mmol/L — ABNORMAL HIGH (ref 22–32)
CREATININE: 1.41 mg/dL — AB (ref 0.61–1.24)
Chloride: 101 mmol/L (ref 101–111)
GFR calc Af Amer: 55 mL/min — ABNORMAL LOW (ref >60)
GFR, EST NON AFRICAN AMERICAN: 47 mL/min — AB (ref >60)
GLUCOSE: 221 mg/dL — AB (ref 65–99)
Potassium: 3.8 mmol/L (ref 3.5–5.1)
Sodium: 141 mmol/L (ref 135–145)

## 2014-11-28 ENCOUNTER — Ambulatory Visit: Payer: Self-pay | Admitting: Cardiovascular Disease

## 2014-11-28 ENCOUNTER — Ambulatory Visit: Payer: Medicare Other

## 2014-11-30 ENCOUNTER — Other Ambulatory Visit: Payer: Medicare Other

## 2014-11-30 NOTE — Patient Outreach (Signed)
[facility]) Care Management  11/30/2014  COWAN SOLAZZO 03-Jun-1940 [id number]   Outreach call #1 to patient for Initial Assessment.  Patient not reached.   RN CM left HIPAA compliant voice message with name and #.  RN CM scheduled for next outreach call within one week.   Mariann Laster, RN, BSN, [facility], CCM  [employer] Management Coordinator [phone] Direct [phone] Cell [phone] Office [phone] Fax

## 2014-12-04 ENCOUNTER — Ambulatory Visit: Payer: Medicare Other

## 2014-12-05 ENCOUNTER — Ambulatory Visit (HOSPITAL_COMMUNITY)
Admission: RE | Admit: 2014-12-05 | Discharge: 2014-12-05 | Disposition: A | Discharge status: Home / Self Care | Payer: Medicare Other | Source: Ambulatory Visit | Attending: Internal Medicine | Admitting: Internal Medicine

## 2014-12-05 ENCOUNTER — Other Ambulatory Visit: Payer: Self-pay | Admitting: Cardiovascular Disease

## 2014-12-05 ENCOUNTER — Ambulatory Visit (INDEPENDENT_AMBULATORY_CARE_PROVIDER_SITE_OTHER): Payer: Medicare Other | Admitting: Physician Assistant

## 2014-12-05 ENCOUNTER — Encounter: Payer: Self-pay | Admitting: Physician Assistant

## 2014-12-05 ENCOUNTER — Ambulatory Visit: Payer: Medicare Other

## 2014-12-05 VITALS — BP 100/64 | HR 57 | Temp 98.1°F | Resp 14 | Ht 71.0 in | Wt 191.0 lb

## 2014-12-05 VITALS — BP 118/68 | HR 80 | Wt 182.2 lb

## 2014-12-05 DIAGNOSIS — I484 Atypical atrial flutter: Secondary | ICD-10-CM | POA: Diagnosis not present

## 2014-12-05 DIAGNOSIS — E785 Hyperlipidemia, unspecified: Secondary | ICD-10-CM | POA: Insufficient documentation

## 2014-12-05 DIAGNOSIS — I480 Paroxysmal atrial fibrillation: Secondary | ICD-10-CM | POA: Diagnosis not present

## 2014-12-05 DIAGNOSIS — R7989 Other specified abnormal findings of blood chemistry: Secondary | ICD-10-CM

## 2014-12-05 DIAGNOSIS — R188 Other ascites: Secondary | ICD-10-CM | POA: Insufficient documentation

## 2014-12-05 DIAGNOSIS — M1990 Unspecified osteoarthritis, unspecified site: Secondary | ICD-10-CM | POA: Diagnosis not present

## 2014-12-05 DIAGNOSIS — Z7901 Long term (current) use of anticoagulants: Secondary | ICD-10-CM | POA: Insufficient documentation

## 2014-12-05 DIAGNOSIS — I481 Persistent atrial fibrillation: Secondary | ICD-10-CM | POA: Insufficient documentation

## 2014-12-05 DIAGNOSIS — K219 Gastro-esophageal reflux disease without esophagitis: Secondary | ICD-10-CM | POA: Insufficient documentation

## 2014-12-05 DIAGNOSIS — Z7984 Long term (current) use of oral hypoglycemic drugs: Secondary | ICD-10-CM | POA: Insufficient documentation

## 2014-12-05 DIAGNOSIS — Z452 Encounter for adjustment and management of vascular access device: Secondary | ICD-10-CM | POA: Insufficient documentation

## 2014-12-05 DIAGNOSIS — J449 Chronic obstructive pulmonary disease, unspecified: Secondary | ICD-10-CM | POA: Insufficient documentation

## 2014-12-05 DIAGNOSIS — Z888 Allergy status to other drugs, medicaments and biological substances status: Secondary | ICD-10-CM | POA: Insufficient documentation

## 2014-12-05 DIAGNOSIS — G4733 Obstructive sleep apnea (adult) (pediatric): Secondary | ICD-10-CM | POA: Insufficient documentation

## 2014-12-05 DIAGNOSIS — I2510 Atherosclerotic heart disease of native coronary artery without angina pectoris: Secondary | ICD-10-CM | POA: Diagnosis not present

## 2014-12-05 DIAGNOSIS — J9610 Chronic respiratory failure, unspecified whether with hypoxia or hypercapnia: Secondary | ICD-10-CM | POA: Diagnosis not present

## 2014-12-05 DIAGNOSIS — I4891 Unspecified atrial fibrillation: Secondary | ICD-10-CM | POA: Diagnosis not present

## 2014-12-05 DIAGNOSIS — F419 Anxiety disorder, unspecified: Secondary | ICD-10-CM | POA: Diagnosis not present

## 2014-12-05 DIAGNOSIS — R799 Abnormal finding of blood chemistry, unspecified: Secondary | ICD-10-CM | POA: Diagnosis not present

## 2014-12-05 DIAGNOSIS — I5032 Chronic diastolic (congestive) heart failure: Secondary | ICD-10-CM | POA: Diagnosis not present

## 2014-12-05 DIAGNOSIS — I5033 Acute on chronic diastolic (congestive) heart failure: Secondary | ICD-10-CM | POA: Diagnosis not present

## 2014-12-05 DIAGNOSIS — I129 Hypertensive chronic kidney disease with stage 1 through stage 4 chronic kidney disease, or unspecified chronic kidney disease: Secondary | ICD-10-CM | POA: Diagnosis not present

## 2014-12-05 DIAGNOSIS — Z85828 Personal history of other malignant neoplasm of skin: Secondary | ICD-10-CM | POA: Diagnosis not present

## 2014-12-05 DIAGNOSIS — L570 Actinic keratosis: Secondary | ICD-10-CM | POA: Diagnosis not present

## 2014-12-05 DIAGNOSIS — N183 Chronic kidney disease, stage 3 (moderate): Secondary | ICD-10-CM | POA: Diagnosis not present

## 2014-12-05 DIAGNOSIS — E119 Type 2 diabetes mellitus without complications: Secondary | ICD-10-CM | POA: Diagnosis not present

## 2014-12-05 DIAGNOSIS — Z79899 Other long term (current) drug therapy: Secondary | ICD-10-CM | POA: Insufficient documentation

## 2014-12-05 DIAGNOSIS — Z86010 Personal history of colonic polyps: Secondary | ICD-10-CM | POA: Insufficient documentation

## 2014-12-05 DIAGNOSIS — F329 Major depressive disorder, single episode, unspecified: Secondary | ICD-10-CM | POA: Diagnosis not present

## 2014-12-05 DIAGNOSIS — Z87891 Personal history of nicotine dependence: Secondary | ICD-10-CM | POA: Insufficient documentation

## 2014-12-05 DIAGNOSIS — Z951 Presence of aortocoronary bypass graft: Secondary | ICD-10-CM | POA: Insufficient documentation

## 2014-12-05 DIAGNOSIS — J90 Pleural effusion, not elsewhere classified: Secondary | ICD-10-CM | POA: Insufficient documentation

## 2014-12-05 DIAGNOSIS — I272 Other secondary pulmonary hypertension: Secondary | ICD-10-CM | POA: Diagnosis not present

## 2014-12-05 DIAGNOSIS — R945 Abnormal results of liver function studies: Secondary | ICD-10-CM

## 2014-12-05 DIAGNOSIS — J984 Other disorders of lung: Secondary | ICD-10-CM | POA: Diagnosis not present

## 2014-12-05 LAB — HEPATIC FUNCTION PANEL
ALT: 20 U/L (ref 9–46)
AST: 20 U/L (ref 10–35)
Albumin: 4 g/dL (ref 3.6–5.1)
Alkaline Phosphatase: 156 U/L — ABNORMAL HIGH (ref 40–115)
BILIRUBIN DIRECT: 0.2 mg/dL (ref <=0.2)
Indirect Bilirubin: 0.6 mg/dL (ref 0.2–1.2)
TOTAL PROTEIN: 7.6 g/dL (ref 6.1–8.1)
Total Bilirubin: 0.8 mg/dL (ref 0.2–1.2)

## 2014-12-05 LAB — BASIC METABOLIC PANEL
ANION GAP: 8 (ref 5–15)
BUN: 26 mg/dL — ABNORMAL HIGH (ref 6–20)
CALCIUM: 9.5 mg/dL (ref 8.9–10.3)
CO2: 30 mmol/L (ref 22–32)
CREATININE: 1.15 mg/dL (ref 0.61–1.24)
Chloride: 99 mmol/L — ABNORMAL LOW (ref 101–111)
GFR calc non Af Amer: >60 mL/min (ref >60)
Glucose, Bld: 149 mg/dL — ABNORMAL HIGH (ref 65–99)
Potassium: 4 mmol/L (ref 3.5–5.1)
SODIUM: 137 mmol/L (ref 135–145)

## 2014-12-05 NOTE — Progress Notes (Signed)
Advanced Heart Failure Clinic Note   Patient ID: [name], male   DOB: Mar 28, 1940, 74 y.o.   MRN: [id number]  Primary Cardiologist: [name]   Subjective:  [name] is a 74 y/o male with COPD , DM, PAF, 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, CKD, AFL s/p ablation in 10/15. 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 Had surgery in 4/15. Did well for awhile went to cardiac rehab and was feeling fine. Could do almost anything he wanted to do. Developed AFL in 10/15 and underwent RFA. Felt good.   In 3/16 began to develop severe SOB. Started O2. Says his symptoms got worse almost overnight. 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 He has seen Dr. Roxy Manns but felt to have no optimal surgical options for repair. His case was also presented to Dr. Michaelle Birks at [facility] who agreed that there was no way to baffle or reroute the anomalous PV flow to the LA. He had a TEE which showed LVEF 60-65% with a dilated right side and a small PFO. He has also been seen by Dr. Lake Bells who performed PFTs that showed significant restrictive lung disease with a low DLCO. He had f/u with Dr. Gilles Chiquito in the [facility] who felt his symptoms were multifactorial. Unclear why symptom onset so quick   He presents today for regular follow-up. At last visit we added spiro, decreased his torsemide and K, and stopped minoxidil. Metolazone changed from qod to prn. Weight down another 14 pounds.  Breathing much better. Not requiring oxygen hardly at all. Feels great.  No edema, orthopnea or PND. Taking torsemide 100/50. Has been more careful with diet. Weight has plateaued.    Labs 11/21/14 K 3.8, Cr 1.41   PFTs (7/16) FEV1 1.45 L (45%) FVC 1.77 L (40%) DLCO 46%  RHC 4/16 RA = 18 RV = 72/4/17 PA = 76/27 (46) PCW = 21 Fick cardiac output/index (using PA sat) = 9.2/4.45 Thermo CO/CI =  10.0/4.87 PVR = 2.2 WU Fick cardiac output/index (using high SVC sat) = 5.2/2.5 Pulse-ox saturation = 89%  High SVC sat = 54% Low SVC sat = 81% (at SVC/RA junction) RA sat = 68% RV sat = 66% PA sat = 68%, 69% IVC sat =56%   EF 60%. RV dilated with moderately reduced systolic function. RVSP 70. + bubble.  VQ/CT negative for PE   TEE 10/15 small PFO  Ab u/s 6/16 liver normal + ascites. Medico renal kidney disease.  Past Medical History  Diagnosis Date  . Hx of adenomatous colonic polyps   . Diabetes mellitus type II   . Hypertension   . Diverticulosis 2001  . Hyperlipidemia   . Persistent atrial fibrillation ([place])     a. s/p MAZE 04/2013 in setting of CABG. b. Amio stopped in 10/2013 after flutter ablation.  . Obstructive sleep apnea     compliant with CPAP  . Chronic diastolic congestive heart failure ([place])   . Hypertensive cardiomyopathy ([place])   . History of cardioversion     x3 (years uncertain)  . H/O hiatal hernia   . Basal cell carcinoma   . Adrenal adenoma   . CAD (coronary artery disease)     a. 04/2013 CABG x 2: LIMA to LAD, SVG to RI, EVH via R thigh.  . S/P Maze operation for atrial fibrillation  a. 04/2013: Complete bilateral atrial lesion set using cryothermy and bipolar radiofrequency ablation with clipping of LA appendage (@ time of CABG)  . GERD (gastroesophageal reflux disease)   . Depression   . Anxiety   . DJD (degenerative joint disease)   . Atypical atrial flutter ([place]) 8/15, 10/15    a. DCCV 08/2013. b. s/p RFA 10/2013.  [name] Respiratory failure ([place])     a. Hypoxia 10/2013 - required supp O2 as inpatient, did not require it at discharge.  . Pleural effusion, left     a. s/p thoracentesis 05/2013.  [name] PFO (patent foramen ovale)     a. Small PFO by TEE 10/2013.  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 S/P Maze operation for atrial  fibrillation 04/05/2013    Complete bilateral atrial lesion set using cryothermy and bipolar radiofrequency ablation with clipping of LA appendage via median sternotomy approach     Current Outpatient Prescriptions  Medication Sig Dispense Refill  . ALPRAZolam (XANAX) 1 MG tablet Take 0.5 mg by mouth at bedtime as needed for sleep.     [name] atorvastatin (LIPITOR) 40 MG tablet Take 20 mg by mouth every Monday, Wednesday, and Friday.     . B Complex Vitamins (VITAMIN B COMPLEX PO) Take 1 tablet by mouth daily.    [name] Besifloxacin HCl 0.6 % SUSP Place 1 drop into the left eye See admin instructions. Use 1.5 days a month starting the day after get injection into the eye    . Blood Glucose Monitoring Suppl (ACCU-CHEK AVIVA PLUS) W/DEVICE KIT Check blood sugar 1 time daily-DX-E11.29 1 kit 0  . carvedilol (COREG) 6.25 MG tablet Take 6.25 mg by mouth 2 (two) times daily with a meal.    . cholecalciferol (VITAMIN D) 1000 UNITS tablet Take 4,000-5,000 Units by mouth See admin instructions. Take 4 tablets four times a week and take 5 tablets three times a week    . diphenhydrAMINE (BENADRYL) 25 MG tablet Take 25 mg by mouth at bedtime as needed for allergies.     [name] glimepiride (AMARYL) 2 MG tablet Take 1 mg by mouth See admin instructions. Take 1/2 tablet daily unless blood sugar is > 120    . glucose blood (ACCU-CHEK AVIVA PLUS) test strip Check blood glucose 1 time daily.   DX-E11.29 100 each 12  . HYDROcodone-acetaminophen (NORCO/VICODIN) 5-325 MG per tablet Take 1 tablet by mouth every 6 (six) hours as needed for moderate pain. 30 tablet   . metFORMIN (GLUCOPHAGE-XR) 500 MG 24 hr tablet Take 2 tablets (1,000 mg total) by mouth 2 (two) times daily. 120 tablet 4  . metolazone (ZAROXOLYN) 5 MG tablet Take 5 mg by mouth daily as needed (weight gain and edema).    . Multiple Vitamin (MULTIVITAMIN WITH MINERALS) TABS tablet Take 1 tablet by mouth daily.    . OXYGEN Inhale 2 L into the lungs continuous.    .  potassium chloride SA (K-DUR,KLOR-CON) 20 MEQ tablet Take 2 tablets (40 mEq total) by mouth daily. 90 tablet 3  . ranitidine (ZANTAC) 150 MG tablet Take 150 mg by mouth daily as needed for heartburn.     . sertraline (ZOLOFT) 100 MG tablet TAKE 1 TABLET BY MOUTH DAILY 30 tablet 11  . spironolactone (ALDACTONE) 25 MG tablet Take 1 tablet (25 mg total) by mouth daily. 90 tablet 3  . torsemide (DEMADEX) 10 MG  tablet Take 100 mg (1 tablet) in the morning and 50 mg (1/2 tablet)  in the evening 90 tablet 3  . valsartan (DIOVAN) 320 MG tablet Take 1 tablet (320 mg total) by mouth daily. (Patient taking differently: Take 160 mg by mouth daily. ) 90 tablet 3  . warfarin (COUMADIN) 5 MG tablet TAKE 1 TABLET BY MOUTH TWICE A DAY 180 tablet 3   No current facility-administered medications for this encounter.    Allergies  Allergen Reactions  . Sunflower Seed [Sunflower Oil] Swelling and Other (See Comments)    Tongue and lip swelling  . Horse-Derived Products Other (See Comments)    Per allergy skin test  . Tetanus Toxoids Other (See Comments)    Per allergy skin test      Social History   Social History  . Marital Status: Married    Spouse Name: N/A  . Number of Children: 1  . Years of Education: N/A   Occupational History  . retired Software engineer    Social History Main Topics  . Smoking status: Former Smoker -- 4.00 packs/day for 25 years    Types: Cigarettes    Quit date: 01/05/1981  . Smokeless tobacco: Never Used  . Alcohol Use: Yes     Comment: 1-5 drinks per week  . Drug Use: No  . Sexual Activity: Not on file   Other Topics Concern  . Not on file   Social History Narrative   Daily caffeine-yes   Patient gets regular exercise.   Pt lives in [place] with spouse.  Retired Software engineer.  Family History  Problem Relation Age of Onset  . Colon cancer Mother     Family History/Uncle   . Colon polyps Mother     Family History  . Atrial fibrillation Mother   . Hypertension Mother   . Colon polyps Sister     Family history  . Diabetes Maternal Uncle   . Stroke Paternal Uncle   . Dementia Father     Danley Danker Vitals:   12/05/14 1403  BP: 118/68  Pulse: 80  Weight: 182 lb 4 oz (82.668 kg)  SpO2: 95%    PHYSICAL EXAM: General: Looks great.  No resp difficulty HEENT: normal Neck: supple. JVP 8. Carotids 2+ bilat; no bruits. No thyromegaly or nodule noted Cor: PMI nondisplaced. RRR. 2/6 TR Lungs: CTAB, normal effort Abdomen: soft, NT, ND, no HSM noted. No bruits or masses. +BS Extremities: no cyanosis, clubbing, rash, edema Neuro: alert & orientedx3, cranial nerves grossly intact. moves all 4 extremities w/o difficulty. Affect pleasant  ASSESSMENT  1. 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 RV failure 3. Chronic respiratory failure 4. Pulmonary HTN 5. Left to right shunting  through large anomalous pulmonary vein into SVC 6. Ascites 7. CAD s/p CABG 8. AF s/ Maze   --on coumadin 9. AFL s/p ablation 10/15 10. CKD, stage 3 11. HTN  PLAN  Looks great. Much improved. Now euvolemic. Will continue current regimen. Check labs today. My main concern now is getting him too dry. If weight down can hold afternoon dose of torsemide as needed. Will continue to follow closely over next few months to ensure stability. Will refer to Pulmonary Rehab. Check echo at next visit.   Bensimhon, Daniel,MD 2:33 PM

## 2014-12-05 NOTE — Patient Instructions (Addendum)
If the nodule on your head or left arm does not go away then schedule removal with Dr. Denna Haggard    Abscess An abscess (boil or furuncle) is an infected area on or under the skin. This area is filled with yellowish-white fluid (pus) and other material (debris). HOME CARE   Only take medicines as told by your doctor.  If you were given antibiotic medicine, take it as directed. Finish the medicine even if you start to feel better.  If gauze is used, follow your doctor's directions for changing the gauze.  To avoid spreading the infection:  Keep your abscess covered with a bandage.  Wash your hands well.  Do not share personal care items, towels, or whirlpools with others.  Avoid skin contact with others.  Keep your skin and clothes clean around the abscess.  Keep all doctor visits as told. GET HELP RIGHT AWAY IF:   You have more pain, puffiness (swelling), or redness in the wound site.  You have more fluid or blood coming from the wound site.  You have muscle aches, chills, or you feel sick.  You have a fever. MAKE SURE YOU:   Understand these instructions.  Will watch your condition.  Will get help right away if you are not doing well or get worse.   This information is not intended to replace advice given to you by your health care provider. Make sure you discuss any questions you have with your health care provider.   Document Released: 06/10/2007 Document Revised: 06/23/2011 Document Reviewed: 03/07/2011 Elsevier Interactive Patient Education [employer].

## 2014-12-05 NOTE — Patient Instructions (Signed)
Lab today  Your physician has requested that you have an echocardiogram. Echocardiography is a painless test that uses sound waves to create images of your heart. It provides your doctor with information about the size and shape of your heart and how well your heart's chambers and valves are working. This procedure takes approximately one hour. There are no restrictions for this procedure.  You have been referred to Pulmonary Rehab, they will contact you to schedule  Your physician recommends that you schedule a follow-up appointment in: 1 month

## 2014-12-05 NOTE — Addendum Note (Signed)
Encounter addended by: Scarlette Calico, RN on: 12/05/2014  2:46 PM<BR>     Documentation filed: Visit Diagnoses, Dx Association, Patient Instructions Section, Orders

## 2014-12-05 NOTE — Progress Notes (Signed)
Coumadin follow up  Patient is on Coumadin for Primary Diagnosis: PAF (paroxysmal atrial fibrillation) ([place]) [I48.0] Patient's last INR is  Lab Results  Component Value Date   INR 2.28* 11/08/2014   INR 2.35* 11/02/2014   INR 2.35* 09/27/2014    Patient denies SOB, CP, dizziness, nose bleeds, easy bleeding, and blood in stool/urine. His coumadin dose was not changed last visit, he is on 48m every day at this time.  He has not taken ABX, has not missed any doses and denies a fall.  He has had minor nose bleeds every morning for a week except for today. [place] and did not bring the adaptor for his CPAP.   He also has complicated heart history with history of CABG and MAZE in 2010, failed DCCV with refractory flutter. Has been evaluated and followed closely for refractory dyspnea and hypoxemia. He is being follows by Dr. BHaroldine Laws with diuresis and paracentesis, took off 4.1 L of fluid, he is down 28 lbs from Nov and is currently walking without oxygen and feeling as he did prior to his first surgery.   lasix was switched to toresmide 1049m1 in the AM and 1/2 in the PM, spirolactone 2525maily and zaroxyln 5mg40mN.  He is going to start pulmonary rehab. Denies leg cramps, dizziness, SOB, CP.   Wt Readings from Last 5 Encounters:  12/05/14 191 lb (86.637 kg)  11/14/14 196 lb 6.4 oz (89.086 kg)  11/08/14 213 lb (96.616 kg)  11/02/14 213 lb (96.616 kg)  10/15/14 208 lb (94.348 kg)     Current Outpatient Prescriptions on File Prior to Visit  Medication Sig Dispense Refill  . ALPRAZolam (XANAX) 1 MG tablet Take 0.5 mg by mouth at bedtime as needed for sleep.     . atMarland Kitchenrvastatin (LIPITOR) 40 MG tablet Take 20 mg by mouth every Monday, Wednesday, and Friday.     . B Complex Vitamins (VITAMIN B COMPLEX PO) Take 1 tablet by mouth daily.    . BeMarland Kitchenifloxacin HCl 0.6 % SUSP Place 1 drop into the left eye See admin instructions. Use 1.5 days a month starting the  day after get injection into the eye    . Blood Glucose Monitoring Suppl (ACCU-CHEK AVIVA PLUS) W/DEVICE KIT Check blood sugar 1 time daily-DX-E11.29 1 kit 0  . carvedilol (COREG) 6.25 MG tablet Take 6.25 mg by mouth 2 (two) times daily with a meal.    . cholecalciferol (VITAMIN D) 1000 UNITS tablet Take 4,000-5,000 Units by mouth See admin instructions. Take 4 tablets four times a week and take 5 tablets three times a week    . diphenhydrAMINE (BENADRYL) 25 MG tablet Take 25 mg by mouth at bedtime as needed for allergies.     . glMarland Kitchenmepiride (AMARYL) 2 MG tablet Take 1 mg by mouth See admin instructions. Take 1/2 tablet daily unless blood sugar is > 120    . glucose blood (ACCU-CHEK AVIVA PLUS) test strip Check blood glucose 1 time daily.   DX-E11.29 100 each 12  . HYDROcodone-acetaminophen (NORCO/VICODIN) 5-325 MG per tablet Take 1 tablet by mouth every 6 (six) hours as needed for moderate pain. 30 tablet   . metFORMIN (GLUCOPHAGE-XR) 500 MG 24 hr tablet Take 2 tablets (1,000 mg total) by mouth 2 (two) times daily. 120 tablet 4  . metolazone (ZAROXOLYN) 5 MG tablet Take 5 mg by mouth daily as needed (weight gain and edema).    . Multiple Vitamin (  MULTIVITAMIN WITH MINERALS) TABS tablet Take 1 tablet by mouth daily.    . OXYGEN Inhale 2 L into the lungs continuous.    . potassium chloride SA (K-DUR,KLOR-CON) 20 MEQ tablet Take 2 tablets (40 mEq total) by mouth daily. 90 tablet 3  . ranitidine (ZANTAC) 150 MG tablet Take 150 mg by mouth daily as needed for heartburn.     . sertraline (ZOLOFT) 100 MG tablet TAKE 1 TABLET BY MOUTH DAILY 30 tablet 11  . spironolactone (ALDACTONE) 25 MG tablet Take 1 tablet (25 mg total) by mouth daily. 90 tablet 3  . torsemide (DEMADEX) 10 MG tablet Take 100 mg (1 tablet) in the morning and 50 mg (1/2 tablet)  in the evening 90 tablet 3  . valsartan (DIOVAN) 320 MG tablet Take 1 tablet (320 mg total) by mouth daily. (Patient taking differently: Take 160 mg by mouth  daily. ) 90 tablet 3  . warfarin (COUMADIN) 5 MG tablet TAKE 1 TABLET BY MOUTH TWICE A DAY 180 tablet 3   No current facility-administered medications on file prior to visit.   Past Medical History  Diagnosis Date  . Hx of adenomatous colonic polyps   . Diabetes mellitus type II   . Hypertension   . Diverticulosis 2001  . Hyperlipidemia   . Persistent atrial fibrillation ([place])     a. s/p MAZE 04/2013 in setting of CABG. b. Amio stopped in 10/2013 after flutter ablation.  . Obstructive sleep apnea     compliant with CPAP  . Chronic diastolic congestive heart failure ([place])   . Hypertensive cardiomyopathy ([place])   . History of cardioversion     x3 (years uncertain)  . H/O hiatal hernia   . Basal cell carcinoma   . Adrenal adenoma   . CAD (coronary artery disease)     a. 04/2013 CABG x 2: LIMA to LAD, SVG to RI, EVH via R thigh.  . S/P Maze operation for atrial fibrillation     a. 04/2013: Complete bilateral atrial lesion set using cryothermy and bipolar radiofrequency ablation with clipping of LA appendage (@ time of CABG)  . GERD (gastroesophageal reflux disease)   . Depression   . Anxiety   . DJD (degenerative joint disease)   . Atypical atrial flutter ([place]) 8/15, 10/15    a. DCCV 08/2013. b. s/p RFA 10/2013.  [name] Respiratory failure ([place])     a. Hypoxia 10/2013 - required supp O2 as inpatient, did not require it at discharge.  . Pleural effusion, left     a. s/p thoracentesis 05/2013.  [name] PFO (patent foramen ovale)     a. Small PFO by TEE 10/2013.  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 S/P Maze operation for atrial fibrillation 04/05/2013    Complete bilateral atrial lesion set using cryothermy and bipolar radiofrequency ablation with clipping of LA appendage via median sternotomy approach    Allergies  Allergen Reactions  . Sunflower Seed [Sunflower Oil] Swelling and  Other (See Comments)    Tongue and lip swelling  . Horse-Derived Products Other (See Comments)    Per allergy skin test  . Tetanus Toxoids Other (See Comments)    Per allergy skin test    Review of Systems  Constitutional: Positive for malaise/fatigue. Negative for fever and chills.  HENT: Negative.   Respiratory: Positive for shortness of breath (better). Negative for cough and wheezing.  Cardiovascular: Negative for chest pain, orthopnea, leg swelling (improved) and PND.  Gastrointestinal: Negative.   Genitourinary: Positive for frequency. Negative for dysuria, urgency, hematuria and flank pain.  Musculoskeletal: Negative.   Skin: Negative.   Neurological: Negative for dizziness, tingling, tremors, sensory change, speech change, focal weakness, seizures, loss of consciousness and weakness.  Psychiatric/Behavioral: Negative.  Negative for depression and suicidal ideas.     Physical: Blood pressure 100/64, pulse 57, temperature 98.1 F (36.7 C), temperature source Temporal, resp. rate 14, height 5' 11"  (1.803 m), weight 191 lb (86.637 kg), SpO2 97 %. Filed Weights   12/05/14 1143  Weight: 191 lb (86.637 kg)    General Appearance: Well nourished, in no apparent distress. ENT/Mouth: Nares clear with no erythema, swelling, mucus on turbinates. No ulcers, cracking, on lips. No erythema, swelling, or exudate on post pharynx.  Neck: Supple, thyroid normal.  Respiratory: lungs with some diffuse decreased air movement, left lower lung with rhonchi but at O2 97 RA   Cardio:  Irregular, irregular rhythm, with holosystolic murmur without rubs or gallops. No edema  Abdomen: distended, with bowl sounds. Non tender, no guarding, rebound, hernias, masses, or organomegaly.  Skin: Scaly erythematous plaque on scalp and left arm, mild jaundice appearance. Warm, dry without rashes, lesions, ecchymosis.  Neuro: Unremarkable  Assessment and plan: Chronic anticoagulation- check INR and will adjust  medication according to labs.  Discussed if patient falls to immediately contact office or go to ER. Discussed foods that can increase or decrease Coumadin levels. Patient understands to call the office before starting a new medication. Follow up in one month.   Healing abscess on right buttocks Continue ABX gel, heating pad, will avoid ABX at this time  Actinic keratosis  Right arm, scalp- 3 freeze and thaw, if not better follow up Dr. Denna Haggard for removal.

## 2014-12-06 ENCOUNTER — Ambulatory Visit: Payer: Medicare Other

## 2014-12-06 LAB — PROTIME-INR
INR: 2.31 — ABNORMAL HIGH (ref <1.50)
PROTHROMBIN TIME: 25.7 s — AB (ref 11.6–15.2)

## 2014-12-07 ENCOUNTER — Other Ambulatory Visit: Payer: Medicare Other

## 2014-12-07 NOTE — Patient Outreach (Signed)
[facility]) Care Management  12/07/2014  [name] 02/12/1940 [id number]   Outreach call #2 to patient for Initial Assessment.  Patient not reached. Contact states patient is outside. RN CM left HIPAA compliant voice message with name and #.  RN CM scheduled for next outreach call within one week.   Mariann Laster, RN, BSN, [facility], CCM  [employer] Management Coordinator [phone] Direct [phone] Cell [phone] Office [phone] Fax

## 2014-12-11 ENCOUNTER — Other Ambulatory Visit: Payer: Self-pay

## 2014-12-11 DIAGNOSIS — I480 Paroxysmal atrial fibrillation: Secondary | ICD-10-CM | POA: Diagnosis not present

## 2014-12-11 DIAGNOSIS — I2510 Atherosclerotic heart disease of native coronary artery without angina pectoris: Secondary | ICD-10-CM | POA: Diagnosis not present

## 2014-12-11 NOTE — Patient Outreach (Signed)
[facility]) Care Management  12/11/2014  [name] Jul 19, 1940 [id number]   Care Coordination:  Contact call to [name], [place] [phone] (Advanced Heart Failure Clinic) Issue:  follow up on 12/05/14 plan to refer patient to Pulmonary Rehab.  Patient reported on 12/11/14 he has not received any calls regarding pulmonary rehab.   Call transferred to Triage.  RN CM left the above update.   RN CM requested call back with update on progress and plan.   RN CM left name and number for call back.    Mariann Laster, RN, BSN, [facility], CCM  [employer] Management Coordinator [phone] Direct [phone] Cell [phone] Office [phone] Fax

## 2014-12-11 NOTE — Patient Outreach (Addendum)
[facility]) Care Management  12/11/2014  JAYZEN AUSTERMAN 03/04/1940 [id number]  Referral Date: 10/22/14 Referral Source: [facility] Tier 4 Referral Issue: CHF, CKD, DM, HTN with 2 admissions  Providers: Primary MD: Dr. [name] - last  appt 11/02/14 - next appt 01/08/2015 Cardiologist: Dr. Cathie Olden: Last appt 11/28/14.  Pulmonologist: Juanito Doom, MD - last appt 10/15/2014 CHF Clinic:  Dr. Shaune Pascal Bensimhon - last appt 12/05/14 - next appt 01/10/15 Opthamologist:  Dr. Zigmund Daniel:  Next appt 12/18/14 [facility] Telephonic RN CM:  Jyll Tomaro RN, BSN, MSHL, CCM  Insurance:  [employer] Medicare Complete  Social:  Patient lives in his home with his wife of 32 years.  Mobility: Ambulates with oxygen to manage shortness of breath but requires no other assistance.  Falls: None Caregiver: wife/Rita Tuch. DME: BP cuff, scales, Oxygen ([facility]), CPap ([facility]).   CHF  H/o heart shut and past right heart catheterization. Home oxygen 2 L at rest and 3-4 with ambulation as needed.  H/o Patient completed evaluation at [facility] 09/06/14 but never heard back from [place] with treatment plan even after RN CM attempted to facilitate communication with Dr. Gilles Chiquito.  Patient confirms MD made no attempt to contact him following RN CM communication with Dr. Gilles Chiquito on 11/06/2014.  Patient has since completed evaluation with Dr. Shaune Pascal Bensimhon - last appt 12/05/14 at the [facility].  Weight this morning 171.3 and weighing daily.  Patient reports substantial improvement in the way he feels, more energy, less shortness of breath and improved diet/appetite.  States he felt better instantly following the paracentesis.   Patient confirms he is happy with the care [name] is providing and feels good about the interventions and medication regime and discussion on his condition management.   Patient is avoiding salt, caffeine and reports improved compliance  with self management interventions.  Patient verbalized clear understanding of yellow zone/call your doctor today guidelines.   Patient states [name] discussed plan for OP Pulmonary Rehab but he has not heard from anyone yet.    Medications: Patient states he has forgotten his medications on Monday for 2 weeks in a row but he knows why.  Patient has started leaving his med box out in view to avoid this from occurring.   Consent: Patient agreed to [facility] services.  Patient received [facility] consent but has not signed or returned.   Plan: Screening 11/01/14 Telephonic RN CM services 11/01/14 Program: CHF Initial Assessment 12/11/15  CHF RN CM advised to weigh daily and log; take log to every MD appt.  RN CM encouraged to self monitor BP and log. RN CM reviewed YELLOW and RED HF Zones.  RN CM will follow-up with [name] on OP Pulmonary Rehab plan.   Educational Materials mailed 12/11/2014 -Heart Failure Green, Yellow, Red Chart -Know Before You Go -Low/High Salt Foods chart -[facility] Care Management Health / Calendar Book  Medications: RN CM reviewed tips for remembering to take medications:  Leave by morning coffee or by toothbrush.   RN CM reviewed the importance of not missing any medication dosages.     RN CM sent Primary MD; Dr. Melford Aase Barriers letter and Initial Assessment 12/11/2014   RN CM instructed in next contact call within the next 30 days for Telephonic Monthly Assessment and care coordination services as needed.  RN CM advised to please notify MD of any changes in condition prior to scheduled appt's.  RN CM provided contact name and # [phone]  or main office # [phone] and 24-hour nurse line # [phone].  RN CM confirmed patient is aware of 911 services for urgent emergency needs. (especially difficulty breathing)  Mariann Laster, RN, BSN, [facility], [place] Management Care Management Coordinator [phone]  Direct [phone] Cell [phone] Office [phone] Fax

## 2014-12-12 ENCOUNTER — Other Ambulatory Visit: Payer: Self-pay | Admitting: *Deleted

## 2014-12-12 MED ORDER — METFORMIN HCL ER 500 MG PO TB24: 1000.0000 mg | ORAL_TABLET | Freq: Two times a day (BID) | ORAL | Status: DC

## 2014-12-18 ENCOUNTER — Encounter (INDEPENDENT_AMBULATORY_CARE_PROVIDER_SITE_OTHER): Payer: Medicare Other | Admitting: Ophthalmology

## 2014-12-18 ENCOUNTER — Other Ambulatory Visit: Payer: Self-pay | Admitting: Internal Medicine

## 2014-12-18 DIAGNOSIS — H35033 Hypertensive retinopathy, bilateral: Secondary | ICD-10-CM | POA: Diagnosis not present

## 2014-12-18 DIAGNOSIS — D3131 Benign neoplasm of right choroid: Secondary | ICD-10-CM

## 2014-12-18 DIAGNOSIS — H43813 Vitreous degeneration, bilateral: Secondary | ICD-10-CM | POA: Diagnosis not present

## 2014-12-18 DIAGNOSIS — H348322 Tributary (branch) retinal vein occlusion, left eye, stable: Secondary | ICD-10-CM

## 2014-12-18 DIAGNOSIS — I10 Essential (primary) hypertension: Secondary | ICD-10-CM | POA: Diagnosis not present

## 2014-12-18 DIAGNOSIS — H338 Other retinal detachments: Secondary | ICD-10-CM

## 2014-12-24 ENCOUNTER — Telehealth (HOSPITAL_COMMUNITY): Payer: Self-pay

## 2015-01-08 ENCOUNTER — Encounter: Payer: Self-pay | Admitting: Internal Medicine

## 2015-01-08 ENCOUNTER — Ambulatory Visit (INDEPENDENT_AMBULATORY_CARE_PROVIDER_SITE_OTHER): Payer: Medicare Other | Admitting: Internal Medicine

## 2015-01-08 ENCOUNTER — Ambulatory Visit: Payer: Medicare Other

## 2015-01-08 VITALS — BP 106/68 | HR 64 | Temp 97.5°F | Resp 16 | Ht 71.0 in | Wt 179.7 lb

## 2015-01-08 DIAGNOSIS — E782 Mixed hyperlipidemia: Secondary | ICD-10-CM | POA: Diagnosis not present

## 2015-01-08 DIAGNOSIS — I480 Paroxysmal atrial fibrillation: Secondary | ICD-10-CM

## 2015-01-08 DIAGNOSIS — Z79899 Other long term (current) drug therapy: Secondary | ICD-10-CM

## 2015-01-08 DIAGNOSIS — Z7901 Long term (current) use of anticoagulants: Secondary | ICD-10-CM

## 2015-01-08 DIAGNOSIS — N183 Chronic kidney disease, stage 3 unspecified: Secondary | ICD-10-CM

## 2015-01-08 DIAGNOSIS — E1129 Type 2 diabetes mellitus with other diabetic kidney complication: Secondary | ICD-10-CM | POA: Diagnosis not present

## 2015-01-08 DIAGNOSIS — I5032 Chronic diastolic (congestive) heart failure: Secondary | ICD-10-CM | POA: Diagnosis not present

## 2015-01-08 DIAGNOSIS — E1122 Type 2 diabetes mellitus with diabetic chronic kidney disease: Secondary | ICD-10-CM | POA: Diagnosis not present

## 2015-01-08 DIAGNOSIS — I10 Essential (primary) hypertension: Secondary | ICD-10-CM

## 2015-01-08 DIAGNOSIS — E559 Vitamin D deficiency, unspecified: Secondary | ICD-10-CM | POA: Diagnosis not present

## 2015-01-08 LAB — CBC WITH DIFFERENTIAL/PLATELET
BASOS ABS: 0 10*3/uL (ref 0.0–0.1)
BASOS PCT: 0 % (ref 0–1)
EOS ABS: 0.2 10*3/uL (ref 0.0–0.7)
EOS PCT: 2 % (ref 0–5)
HCT: 39 % (ref 39.0–52.0)
Hemoglobin: 12.7 g/dL — ABNORMAL LOW (ref 13.0–17.0)
Lymphocytes Relative: 14 % (ref 12–46)
Lymphs Abs: 1.1 10*3/uL (ref 0.7–4.0)
MCH: 28.9 pg (ref 26.0–34.0)
MCHC: 32.6 g/dL (ref 30.0–36.0)
MCV: 88.6 fL (ref 78.0–100.0)
MPV: 8.6 fL (ref 8.6–12.4)
Monocytes Absolute: 0.6 10*3/uL (ref 0.1–1.0)
Monocytes Relative: 7 % (ref 3–12)
NEUTROS PCT: 77 % (ref 43–77)
Neutro Abs: 6.1 10*3/uL (ref 1.7–7.7)
PLATELETS: 204 10*3/uL (ref 150–400)
RBC: 4.4 MIL/uL (ref 4.22–5.81)
RDW: 15.7 % — AB (ref 11.5–15.5)
WBC: 7.9 10*3/uL (ref 4.0–10.5)

## 2015-01-08 LAB — LIPID PANEL
Cholesterol: 161 mg/dL (ref 125–200)
HDL: 25 mg/dL — AB (ref >=40)
LDL CALC: 91 mg/dL (ref <130)
TRIGLYCERIDES: 224 mg/dL — AB (ref <150)
Total CHOL/HDL Ratio: 6.4 Ratio — ABNORMAL HIGH (ref <=5.0)
VLDL: 45 mg/dL — ABNORMAL HIGH (ref <30)

## 2015-01-08 LAB — BASIC METABOLIC PANEL WITH GFR
BUN: 40 mg/dL — AB (ref 7–25)
CO2: 33 mmol/L — ABNORMAL HIGH (ref 20–31)
Calcium: 9.5 mg/dL (ref 8.6–10.3)
Chloride: 96 mmol/L — ABNORMAL LOW (ref 98–110)
Creat: 1.36 mg/dL — ABNORMAL HIGH (ref 0.70–1.18)
GFR, EST AFRICAN AMERICAN: 58 mL/min — AB (ref >=60)
GFR, EST NON AFRICAN AMERICAN: 51 mL/min — AB (ref >=60)
Glucose, Bld: 257 mg/dL — ABNORMAL HIGH (ref 65–99)
Potassium: 4 mmol/L (ref 3.5–5.3)
SODIUM: 140 mmol/L (ref 135–146)

## 2015-01-08 LAB — HEPATIC FUNCTION PANEL
ALBUMIN: 4.4 g/dL (ref 3.6–5.1)
ALK PHOS: 109 U/L (ref 40–115)
ALT: 23 U/L (ref 9–46)
AST: 22 U/L (ref 10–35)
BILIRUBIN DIRECT: 0.2 mg/dL (ref <=0.2)
BILIRUBIN TOTAL: 0.9 mg/dL (ref 0.2–1.2)
Indirect Bilirubin: 0.7 mg/dL (ref 0.2–1.2)
Total Protein: 7.4 g/dL (ref 6.1–8.1)

## 2015-01-08 LAB — TSH: TSH: 1.467 u[IU]/mL (ref 0.350–4.500)

## 2015-01-08 LAB — MAGNESIUM: MAGNESIUM: 2.2 mg/dL (ref 1.5–2.5)

## 2015-01-08 NOTE — Patient Instructions (Signed)
Recommend Adult Low Dose Aspirin or   coated  Aspirin 81 mg daily   To reduce risk of Colon Cancer 20 %,   Skin Cancer 26 % ,   Melanoma 46%   and   Pancreatic cancer 60%   ++++++++++++++++++++++++++++++++++++++++++++++++++++++  Vitamin D goal   is between 70-100.   Please make sure that you are taking your Vitamin D as directed.   It is very important as a natural anti-inflammatory   helping hair, skin, and nails, as well as reducing stroke and heart attack risk.   It helps your bones and helps with mood.  It also decreases numerous cancer risks so please take it as directed.   Low Vit D is associated with a 200-300% higher risk for CANCER   and 200-300% higher risk for HEART   ATTACK  &  STROKE.   ......................................  It is also associated with higher death rate at younger ages,   autoimmune diseases like Rheumatoid arthritis, Lupus, Multiple Sclerosis.     Also many other serious conditions, like depression, Alzheimer's  Dementia, infertility, muscle aches, fatigue, fibromyalgia - just to name a few.  ++++++++++++++++++++++++++++++++++++++++++++++++  Recommend the book "The END of DIETING" by Dr Joel Fuhrman   & the book "The END of DIABETES " by Dr Joel Fuhrman  At [employer] - get book & Audio CD's     Being diabetic has a  300% increased risk for heart attack, stroke, cancer, and alzheimer- type vascular dementia. It is very important that you work harder with diet by avoiding all foods that are white. Avoid white rice (brown & wild rice is OK), white potatoes (sweetpotatoes in moderation is OK), White bread or wheat bread or anything made out of white flour like bagels, donuts, rolls, buns, biscuits, cakes, pastries, cookies, pizza crust, and pasta (made from white flour & egg whites) - vegetarian pasta or spinach or wheat pasta is OK. Multigrain breads like Arnold's or Pepperidge [employer], or multigrain sandwich thins or flatbreads.  Diet,  exercise and weight loss can reverse and cure diabetes in the early stages.  Diet, exercise and weight loss is very important in the control and prevention of complications of diabetes which affects every system in your body, ie. Brain - dementia/stroke, eyes - glaucoma/blindness, heart - heart attack/heart failure, kidneys - dialysis, stomach - gastric paralysis, intestines - malabsorption, nerves - severe painful neuritis, circulation - gangrene & loss of a leg(s), and finally cancer and Alzheimers.    I recommend avoid fried & greasy foods,  sweets/candy, white rice (brown or wild rice or Quinoa is OK), white potatoes (sweet potatoes are OK) - anything made from white flour - bagels, doughnuts, rolls, buns, biscuits,white and wheat breads, pizza crust and traditional pasta made of white flour & egg white(vegetarian pasta or spinach or wheat pasta is OK).  Multi-grain bread is OK - like multi-grain flat bread or sandwich thins. Avoid alcohol in excess. Exercise is also important.    Eat all the vegetables you want - avoid meat, especially red meat and dairy - especially cheese.  Cheese is the most concentrated form of trans-fats which is the worst thing to clog up our arteries. Veggie cheese is OK which can be found in the fresh produce section at [employer] or [employer] or [employer]  ++++++++++++++++++++++++++++++++++++++++++++++++++ DASH Eating Plan  DASH stands for "Dietary Approaches to Stop Hypertension."   The DASH eating plan is a healthy eating plan that has been shown to reduce high   blood pressure (hypertension). Additional health benefits may include reducing the risk of type 2 diabetes mellitus, heart disease, and stroke. The DASH eating plan may also help with weight loss.  WHAT DO I NEED TO KNOW ABOUT THE DASH EATING PLAN? For the DASH eating plan, you will follow these general guidelines:  Choose foods with a percent daily value for sodium of less than 5% (as listed on the food  label).  Use salt-free seasonings or herbs instead of table salt or sea salt.  Check with your health care provider or pharmacist before using salt substitutes.  Eat lower-sodium products, often labeled as "lower sodium" or "no salt added."  Eat fresh foods.  Eat more vegetables, fruits, and low-fat dairy products.    Choose whole grains. Look for the word "whole" as the first word in the ingredient list.  Choose fish   Limit sweets, desserts, sugars, and sugary drinks.  Choose heart-healthy fats.  Eat veggie cheese   Eat more home-cooked food and less restaurant, buffet, and fast food.  Limit fried foods.  Cook foods using methods other than frying.  Limit canned vegetables. If you do use them, rinse them well to decrease the sodium.  When eating at a restaurant, ask that your food be prepared with less salt, or no salt if possible.                      WHAT FOODS CAN I EAT?  Seek help from a dietitian for individual calorie needs.  Grains Whole grain or whole wheat bread. Brown rice. Whole grain or whole wheat pasta. Quinoa, bulgur, and whole grain cereals. Low-sodium cereals. Corn or whole wheat flour tortillas. Whole grain cornbread. Whole grain crackers. Low-sodium crackers.  Vegetables Fresh or frozen vegetables (raw, steamed, roasted, or grilled). Low-sodium or reduced-sodium tomato and vegetable juices. Low-sodium or reduced-sodium tomato sauce and paste. Low-sodium or reduced-sodium canned vegetables.   Fruits All fresh, canned (in natural juice), or frozen fruits.  Protein Products  All fish and seafood.  Dried beans, peas, or lentils. Unsalted nuts and seeds. Unsalted canned beans.  Dairy Low-fat dairy products, such as skim or 1% milk, 2% or reduced-fat cheeses, low-fat ricotta or cottage cheese, or plain low-fat yogurt. Low-sodium or reduced-sodium cheeses.  Fats and Oils Tub margarines without trans fats. Light or reduced-fat mayonnaise and salad  dressings (reduced sodium). Avocado. Safflower, olive, or canola oils. Natural peanut or almond butter.  Other Unsalted popcorn and pretzels. The items listed above may not be a complete list of recommended foods or beverages. Contact your dietitian for more options.  +++++++++++++++++++++++++++++++++++++++++++  WHAT FOODS ARE NOT RECOMMENDED?  Grains/ White flour or wheat flour  White bread. White pasta. White rice. Refined cornbread. Bagels and croissants. Crackers that contain trans fat.  Vegetables  Creamed or fried vegetables. Vegetables in a . Regular canned vegetables. Regular canned tomato sauce and paste. Regular tomato and vegetable juices.  Fruits Dried fruits. Canned fruit in light or heavy syrup. Fruit juice.  Meat and Other Protein Products Meat in general. Fatty cuts of meat. Ribs, chicken wings, bacon, sausage, bologna, salami, chitterlings, fatback, hot dogs, bratwurst, and packaged luncheon meats.  Dairy Whole or 2% milk, cream, half-and-half, and cream cheese. Whole-fat or sweetened yogurt. Full-fat cheeses or blue cheese. Nondairy creamers and whipped toppings. Processed cheese, cheese spreads, or cheese curds.  Condiments Onion and garlic salt, seasoned salt, table salt, and sea salt. Canned and packaged gravies. Worcestershire sauce. Tartar sauce.   Barbecue sauce. Teriyaki sauce. Soy sauce, including reduced sodium. Steak sauce. Fish sauce. Oyster sauce. Cocktail sauce. Horseradish. Ketchup and mustard. Meat flavorings and tenderizers. Bouillon cubes. Hot sauce. Tabasco sauce. Marinades. Taco seasonings. Relishes.  Fats and Oils Butter, stick margarine, lard, shortening, ghee, and bacon fat. Coconut, palm kernel, or palm oils. Regular salad dressings.  Pickles and olives. Salted popcorn and pretzels. The items listed above may not be a complete list of foods and beverages to avoid.   

## 2015-01-08 NOTE — Progress Notes (Signed)
Patient ID: [name], male   DOB: Sep 24, 1940, 75 y.o.   MRN: [id number]     This very nice 75 y.o. MWM presents for  follow up with Hypertension, ASHD/CHF Hyperlipidemia, T2_DM and Vitamin D Deficiency.      Patient is treated for HTN (1980's) and ASHD (2003) & BP has been controlled at home. Today's BP: 106/68 mmHg. Pt underwent CABG w/Maze in 2010and in 2015 underwent RFA for AFL. 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  Patient currently improved dramatically from 6-9 months ago & has had no complaints of any cardiac type chest pain, palpitations, dyspnea/orthopnea/PND, dizziness, claudication, or dependent edema.     Hyperlipidemia is controlled with diet & meds. Patient denies myalgias or other med SE's. Last Lipids were at goal with Cholesterol 107*; HDL 23*; LDL 61; Triglycerides 116 on 09/27/2014.     Also, the patient has history of T2_NIDDM w/CKD3 predating dx since 1995 & treated since 2003 and has had no symptoms of reactive hypoglycemia, diabetic polys, paresthesias or visual blurring.  Last A1c was  6.8% on 09/27/2014.     Further, the patient also has history of Vitamin D Deficiency of 35 in 2008 and supplements vitamin D without any suspected side-effects. Last vitamin D was  97 on 09/27/2014.  Medication Sig  . ALPRAZolam (XANAX) 1 MG tablet Take 0.5 mg by mouth at bedtime as needed for sleep.   [name] atorvastatin (LIPITOR) 40 MG tablet Take 20 mg by mouth every Monday, Wednesday, and Friday.   . B Complex Vitamins (VITAMIN B COMPLEX PO) Take 1 tablet by mouth daily.  [name] Besifloxacin HCl 0.6 % SUSP Place 1 drop into the left eye See admin instructions. Use 1.5 days a month starting the day after get injection into the eye  . Blood Glucose Monitoring Suppl (ACCU-CHEK AVIVA PLUS) W/DEVICE KIT Check blood sugar 1 time daily-DX-E11.29  .  carvedilol (COREG) 6.25 MG tablet TAKE 1 TABLET (6.25 MG TOTAL) BY MOUTH 2 (TWO) TIMES DAILY.  . cholecalciferol (VITAMIN D) 1000 UNITS tablet Take 4,000-5,000 Units by mouth See admin instructions. Take 4 tablets four times a week and take 5 tablets three times a week  . diphenhydrAMINE (BENADRYL) 25 MG tablet Take 25 mg by mouth at bedtime as needed for allergies.   [name] glimepiride (AMARYL) 2 MG tablet Take 1 mg by mouth See admin instructions. Take 1/2 tablet daily unless blood sugar is > 120  . glucose blood (ACCU-CHEK AVIVA PLUS) test strip Check blood glucose 1 time daily.   DX-E11.29  . HYDROcodone-acetaminophen (NORCO/VICODIN) 5-325 MG per tablet Take 1 tablet by mouth every 6 (six) hours as needed for moderate pain.  . metFORMIN (GLUCOPHAGE-XR) 500 MG 24 hr tablet Take 2 tablets (1,000 mg total) by mouth 2 (two) times daily.  . metolazone (ZAROXOLYN) 5 MG tablet Take 5 mg by mouth daily as needed (weight gain and edema).  . Multiple Vitamin (MULTIVITAMIN WITH MINERALS) TABS tablet Take 1 tablet by mouth daily.  . OXYGEN Inhale 2 L into the lungs as needed.   . potassium chloride SA (K-DUR,KLOR-CON) 20 MEQ tablet Take 2 tablets (40 mEq total) by mouth daily.  . ranitidine (ZANTAC) 150 MG tablet Take 150 mg by mouth daily as needed for heartburn.   . sertraline (ZOLOFT) 100 MG tablet TAKE 1  TABLET BY MOUTH DAILY  . spironolactone (ALDACTONE) 25 MG tablet Take 1 tablet (25 mg total) by mouth daily.  [name] torsemide (DEMADEX) 100 MG tablet Take 100 mg in AM and 50 mg in PM  . valsartan (DIOVAN) 320 MG tablet Take 160 mg by mouth daily.  [name] warfarin (COUMADIN) 5 MG tablet TAKE 1 TABLET BY MOUTH TWICE A DAY  . carvedilol (COREG) 6.25 MG tablet Take 6.25 mg by mouth 2 (two) times daily with a meal.   Allergies  Allergen Reactions  . Sunflower Seed [Sunflower Oil] Swelling and Other (See Comments)    Tongue and lip swelling  . Horse-Derived Products Other (See Comments)    Per allergy skin test  .  Tetanus Toxoids Other (See Comments)    Per allergy skin test   PMHx:   Past Medical History  Diagnosis Date  . Hx of adenomatous colonic polyps   . Diabetes mellitus type II   . Hypertension   . Diverticulosis 2001  . Hyperlipidemia   . Persistent atrial fibrillation ([place])     a. s/p MAZE 04/2013 in setting of CABG. b. Amio stopped in 10/2013 after flutter ablation.  . Obstructive sleep apnea     compliant with CPAP  . Chronic diastolic congestive heart failure ([place])   . Hypertensive cardiomyopathy ([place])   . History of cardioversion     x3 (years uncertain)  . H/O hiatal hernia   . Basal cell carcinoma   . Adrenal adenoma   . CAD (coronary artery disease)     a. 04/2013 CABG x 2: LIMA to LAD, SVG to RI, EVH via R thigh.  . S/P Maze operation for atrial fibrillation     a. 04/2013: Complete bilateral atrial lesion set using cryothermy and bipolar radiofrequency ablation with clipping of LA appendage (@ time of CABG)  . GERD (gastroesophageal reflux disease)   . Depression   . Anxiety   . DJD (degenerative joint disease)   . Atypical atrial flutter ([place]) 8/15, 10/15    a. DCCV 08/2013. b. s/p RFA 10/2013.  [name] Respiratory failure ([place])     a. Hypoxia 10/2013 - required supp O2 as inpatient, did not require it at discharge.  . Pleural effusion, left     a. s/p thoracentesis 05/2013.  [name] PFO (patent foramen ovale)     a. Small PFO by TEE 10/2013.  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 S/P Maze operation for atrial fibrillation 04/05/2013    Complete bilateral atrial lesion set using cryothermy and bipolar radiofrequency ablation with clipping of LA appendage via median sternotomy approach    Immunization History  Administered Date(s) Administered  . Influenza Split 10/25/2012  . Influenza, High Dose Seasonal PF 09/26/2013, 09/27/2014  . Pneumococcal Conjugate-13  01/30/2014  . Pneumococcal Polysaccharide-23 10/20/2011  . Td 01/06/2000   Past Surgical History  Procedure Laterality Date  . Polinydal cyst      Removed  . Great toe arthrodesis, interphalangeal joint      Right foot  . Retina repair-right    . Cataract extraction      bilateral  . Basal cell carcinoma excision      x3 on face  . Polypectomy    . Cardiac catheterization      myocardial bridge but no cad  . Coronary artery bypass graft N/A 04/05/2013    Procedure: CORONARY ARTERY BYPASS  GRAFTING (CABG) TIMES TWO USING LEFT INTERNAL MAMMARY ARTERY AND RIGHT SAPHENOUS LEG VEIN HARVESTED ENDOSCOPICALLY;  Surgeon: Rexene Alberts, MD;  Location: [facility];  Service: Open Heart Surgery;  Laterality: N/A;  . Maze N/A 04/05/2013    Procedure: MAZE;  Surgeon: Rexene Alberts, MD;  Location: [place];  Service: Open Heart Surgery;  Laterality: N/A;  . Intraoperative transesophageal echocardiogram N/A 04/05/2013    Procedure: INTRAOPERATIVE TRANSESOPHAGEAL ECHOCARDIOGRAM;  Surgeon: Rexene Alberts, MD;  Location: [place];  Service: Open Heart Surgery;  Laterality: N/A;  . Tee without cardioversion N/A 08/23/2013    Procedure: TRANSESOPHAGEAL ECHOCARDIOGRAM (TEE);  Surgeon: Sanda Klein, MD;  Location: [place];  Service: Cardiovascular;  Laterality: N/A;  . Cardioversion N/A 08/23/2013    Procedure: CARDIOVERSION;  Surgeon: Sanda Klein, MD;  Location: [facility] ENDOSCOPY;  Service: Cardiovascular;  Laterality: N/A;  . Tee without cardioversion N/A 10/26/2013    Procedure: TRANSESOPHAGEAL ECHOCARDIOGRAM (TEE);  Surgeon: Sueanne Margarita, MD;  Location: [facility] ENDOSCOPY;  Service: Cardiovascular;  Laterality: N/A;  . Left heart catheterization with coronary angiogram N/A 03/07/2013    Procedure: LEFT HEART CATHETERIZATION WITH CORONARY ANGIOGRAM;  Surgeon: Burnell Blanks, MD;  Location: [facility] CATH LAB;  Service: Cardiovascular;  Laterality: N/A;  . Atrial fibrillation ablation N/A 10/26/2013    Procedure: ATRIAL  FIBRILLATION ABLATION;  Surgeon: Coralyn Mark, MD;  Location: [place] CATH LAB;  Service: Cardiovascular;  Laterality: N/A;  . Carpometacarpel suspension plasty Left 02/14/2014    Procedure: CARPOMETACARPEL ([place]) SUSPENSIONPLASTY THUMB  WITH  ABDUCTOR POLLICIS LONGUS TRANSFER AND STENOSING TENOSYNOVITIS RELEASE LEFT WRIST;  Surgeon: Charlotte Crumb, MD;  Location: [place];  Service: Orthopedics;  Laterality: Left;  . Trapezium resection    . Right heart catheterization N/A 05/03/2014    Procedure: RIGHT HEART CATH;  Surgeon: Jolaine Artist, MD;  Location: [facility] CATH LAB;  Service: Cardiovascular;  Laterality: N/A;  . Tee without cardioversion N/A 06/05/2014    Procedure: TRANSESOPHAGEAL ECHOCARDIOGRAM (TEE);  Surgeon: Thayer Headings, MD;  Location: [facility] ENDOSCOPY;  Service: Cardiovascular;  Laterality: N/A;   FHx:    Reviewed / unchanged  SHx:    Reviewed / unchanged  Systems Review:  Constitutional: Denies fever, chills, wt changes, headaches, insomnia, fatigue, night sweats, change in appetite. Eyes: Denies redness, blurred vision, diplopia, discharge, itchy, watery eyes.  ENT: Denies discharge, congestion, post nasal drip, epistaxis, sore throat, earache, hearing loss, dental pain, tinnitus, vertigo, sinus pain, snoring.  CV: Denies chest pain, palpitations, irregular heartbeat, syncope, dyspnea, diaphoresis, orthopnea, PND, claudication or edema. Respiratory: denies cough, dyspnea, DOE, pleurisy, hoarseness, laryngitis, wheezing.  Gastrointestinal: Denies dysphagia, odynophagia, heartburn, reflux, water brash, abdominal pain or cramps, nausea, vomiting, bloating, diarrhea, constipation, hematemesis, melena, hematochezia  or hemorrhoids. Genitourinary: Denies dysuria, frequency, urgency, nocturia, hesitancy, discharge, hematuria or flank pain. Musculoskeletal: Denies arthralgias, myalgias, stiffness, jt. swelling, pain, limping or strain/sprain.  Skin: Denies pruritus, rash,  hives, warts, acne, eczema or change in skin lesion(s). Neuro: No weakness, tremor, incoordination, spasms, paresthesia or pain. Psychiatric: Denies confusion, memory loss or sensory loss. Endo: Denies change in weight, skin or hair change.  Heme/Lymph: No excessive bleeding, bruising or enlarged lymph nodes.  Physical Exam  BP 106/68 mmHg  Pulse 64  Temp(Src) 97.5 F (36.4 C)  Resp 16  Ht 5' 11"  (1.803 m)  Wt 179 lb 11.2 oz (81.511 kg)  BMI 25.07 kg/m2  Appears well nourished and in no distress. Eyes: PERRLA, EOMs, conjunctiva no swelling or  erythema. Sinuses: No frontal/maxillary tenderness ENT/Mouth: EAC's clear, TM's nl w/o erythema, bulging. Nares clear w/o erythema, swelling, exudates. Oropharynx clear without erythema or exudates. Oral hygiene is good. Tongue normal, non obstructing. Hearing intact.  Neck: Supple. Thyroid nl. Car 2+/2+ without bruits, nodes or JVD. Chest:  Median sternotomy scar. Respirations nl with BS clear & equal w/o rales, rhonchi, wheezing or stridor.  Cor: Heart sounds normal w/ regular rate and rhythm without sig. murmurs, gallops, clicks, or rubs. Peripheral pulses normal and equal  without edema.  Abdomen: Soft & bowel sounds normal. Non-tender w/o guarding, rebound, hernias, masses, or organomegaly.  Lymphatics: Unremarkable.  Musculoskeletal: Full ROM all peripheral extremities, joint stability, 5/5 strength, and normal gait.  Skin: Warm, dry without exposed rashes, lesions or ecchymosis apparent.  Neuro: Cranial nerves intact, reflexes equal bilaterally. Sensory-motor testing grossly intact. Tendon reflexes grossly intact.  Pysch: Alert & oriented x 3.  Insight and judgement nl & appropriate. No ideations.  Assessment and Plan:  1. Essential hypertension   2. Hyperlipidemia  - Lipid panel - TSH  3. Vitamin D deficiency  - VITAMIN D 25 Hydroxy (Vit-D Deficiency, Fractures)  4. Medication management  - CBC with Differential/Platelet -  BASIC METABOLIC PANEL WITH GFR - Hepatic function panel - Magnesium  5. Chronic diastolic congestive heart failure ([place])   6. PAF (paroxysmal atrial fibrillation) ([place])   7. Chronic anticoagulation  - Protime-INR  8. Controlled type 2 diabetes mellitus with stage 3 chronic kidney disease, without long-term current use of insulin (HCC)  - Hemoglobin A1c - Insulin, random   Recommended regular exercise, BP monitoring, weight control, and discussed med and SE's. Recommended labs to assess and monitor clinical status. Further disposition pending results of labs. Over 30 minutes of exam, counseling, chart review was performed

## 2015-01-09 ENCOUNTER — Other Ambulatory Visit: Payer: Self-pay

## 2015-01-09 ENCOUNTER — Other Ambulatory Visit: Payer: Self-pay | Admitting: Internal Medicine

## 2015-01-09 LAB — HEMOGLOBIN A1C
Hgb A1c MFr Bld: 8.5 % — ABNORMAL HIGH (ref <5.7)
Mean Plasma Glucose: 197 mg/dL — ABNORMAL HIGH (ref <117)

## 2015-01-09 LAB — PROTIME-INR
INR: 1.79 — AB (ref <1.50)
PROTHROMBIN TIME: 21 s — AB (ref 11.6–15.2)

## 2015-01-09 LAB — INSULIN, RANDOM: INSULIN: 8.9 u[IU]/mL (ref 2.0–19.6)

## 2015-01-09 LAB — VITAMIN D 25 HYDROXY (VIT D DEFICIENCY, FRACTURES): VIT D 25 HYDROXY: 64 ng/mL (ref 30–100)

## 2015-01-09 NOTE — Patient Outreach (Signed)
[facility]) Care Management  01/09/2015  [name] [birth year] [id number]   Telephonic Monthly Assessment  Outreach call #1 to patient.  Patient not reached for scheduled phone appt.   Per Epic MR review:  H/o Primary MD appt completed with Dr. [name] on 01/08/2015. Wt 179 lb.  Plan: RN CM will schedule for next outreach call within one week.   Mariann Laster, RN, BSN, [facility], CCM  [employer] Management Coordinator [phone] Direct [phone] Cell [phone] Office [phone] Fax

## 2015-01-10 ENCOUNTER — Ambulatory Visit (HOSPITAL_COMMUNITY)
Admission: RE | Admit: 2015-01-10 | Discharge: 2015-01-10 | Disposition: A | Discharge status: Home / Self Care | Payer: Medicare Other | Source: Ambulatory Visit | Attending: Internal Medicine | Admitting: Internal Medicine

## 2015-01-10 ENCOUNTER — Other Ambulatory Visit: Payer: Self-pay | Admitting: Internal Medicine

## 2015-01-10 ENCOUNTER — Encounter (HOSPITAL_COMMUNITY): Payer: Self-pay | Admitting: Internal Medicine

## 2015-01-10 ENCOUNTER — Ambulatory Visit (HOSPITAL_BASED_OUTPATIENT_CLINIC_OR_DEPARTMENT_OTHER)
Admission: RE | Admit: 2015-01-10 | Discharge: 2015-01-10 | Disposition: A | Discharge status: Home / Self Care | Payer: Medicare Other | Source: Ambulatory Visit | Attending: Internal Medicine | Admitting: Internal Medicine

## 2015-01-10 VITALS — BP 112/62 | HR 68 | Wt 181.2 lb

## 2015-01-10 DIAGNOSIS — N183 Chronic kidney disease, stage 3 unspecified: Secondary | ICD-10-CM

## 2015-01-10 DIAGNOSIS — E119 Type 2 diabetes mellitus without complications: Secondary | ICD-10-CM | POA: Insufficient documentation

## 2015-01-10 DIAGNOSIS — I340 Nonrheumatic mitral (valve) insufficiency: Secondary | ICD-10-CM | POA: Insufficient documentation

## 2015-01-10 DIAGNOSIS — I5032 Chronic diastolic (congestive) heart failure: Secondary | ICD-10-CM | POA: Diagnosis not present

## 2015-01-10 DIAGNOSIS — I272 Other secondary pulmonary hypertension: Secondary | ICD-10-CM | POA: Insufficient documentation

## 2015-01-10 DIAGNOSIS — E785 Hyperlipidemia, unspecified: Secondary | ICD-10-CM | POA: Insufficient documentation

## 2015-01-10 DIAGNOSIS — I480 Paroxysmal atrial fibrillation: Secondary | ICD-10-CM | POA: Insufficient documentation

## 2015-01-10 DIAGNOSIS — Z7901 Long term (current) use of anticoagulants: Secondary | ICD-10-CM | POA: Insufficient documentation

## 2015-01-10 DIAGNOSIS — Z951 Presence of aortocoronary bypass graft: Secondary | ICD-10-CM | POA: Insufficient documentation

## 2015-01-10 DIAGNOSIS — Z79899 Other long term (current) drug therapy: Secondary | ICD-10-CM | POA: Insufficient documentation

## 2015-01-10 DIAGNOSIS — Z7984 Long term (current) use of oral hypoglycemic drugs: Secondary | ICD-10-CM | POA: Insufficient documentation

## 2015-01-10 DIAGNOSIS — Q262 Total anomalous pulmonary venous connection: Secondary | ICD-10-CM | POA: Diagnosis not present

## 2015-01-10 DIAGNOSIS — I509 Heart failure, unspecified: Secondary | ICD-10-CM

## 2015-01-10 DIAGNOSIS — Z87891 Personal history of nicotine dependence: Secondary | ICD-10-CM | POA: Insufficient documentation

## 2015-01-10 DIAGNOSIS — I517 Cardiomegaly: Secondary | ICD-10-CM | POA: Diagnosis not present

## 2015-01-10 DIAGNOSIS — I351 Nonrheumatic aortic (valve) insufficiency: Secondary | ICD-10-CM | POA: Insufficient documentation

## 2015-01-10 DIAGNOSIS — R188 Other ascites: Secondary | ICD-10-CM | POA: Insufficient documentation

## 2015-01-10 DIAGNOSIS — I2510 Atherosclerotic heart disease of native coronary artery without angina pectoris: Secondary | ICD-10-CM | POA: Diagnosis not present

## 2015-01-10 DIAGNOSIS — I4891 Unspecified atrial fibrillation: Secondary | ICD-10-CM | POA: Insufficient documentation

## 2015-01-10 NOTE — Patient Instructions (Signed)
Labs every month at primary care MD office  We will contact you in 3 months to schedule your next appointment.

## 2015-01-10 NOTE — Progress Notes (Signed)
Advanced Heart Failure Clinic Note   Patient ID: [name], male   DOB: 08/09/1940, 75 y.o.   MRN: [id number]  Primary Cardiologist: [name]   Subjective:  [name] is a 75 y/o male with COPD , DM, PAF, 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, CKD, AFL s/p ablation in 10/15. 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 Had surgery in 4/15. Did well for awhile went to cardiac rehab and was feeling fine. Could do almost anything he wanted to do. Developed AFL in 10/15 and underwent RFA. Felt good.   In 3/16 began to develop severe SOB. Started O2. Says his symptoms got worse almost overnight. 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 He has seen Dr. Roxy Manns but felt to have no optimal surgical options for repair. His case was also presented to Dr. Michaelle Birks at [facility] who agreed that there was no way to baffle or reroute the anomalous PV flow to the LA. He had a TEE which showed LVEF 60-65% with a dilated right side and a small PFO. He has also been seen by Dr. [place] Bells who performed PFTs that showed significant restrictive lung disease with a low DLCO. He had f/u with Dr. Gilles Chiquito in the [facility] who felt his symptoms were multifactorial. Unclear why symptom onset so quick   He presents today for regular follow-up. Feels good. Recently saw Dr. Melford Aase and renal function up slightly. Creatinine 1.15 -> 1.36 (previously was 1.4->1.6 range). Told to liberalize fluid intake. Still taking torsemide 100 in the am and several times a week takes 50 mg in pm. Takes the extra dose based on weight or how many stadium cups of water he drinks per day. Weight stable 172-175. Breathing much better. Occasional DOE if carrying heavy objects. Not requiring oxygen hardly at all.  No edema, orthopnea or PND. Still waiting to start Pulmonary Rehab.   Echo reviewed today LVEF 55-60% RV moderately  dilated mild HK. RVSP 41mHG   Labs 11/21/14 K 3.8, Cr 1.41 Labs  12/05/14  K 4.0 1.15 Labs  01/08/15  K 4.0 1.36    PFTs (7/16) FEV1 1.45 L (45%) FVC 1.77 L (40%) DLCO 46%  RHC 4/16 RA = 18 RV = 72/4/17 PA = 76/27 (46) PCW = 21 Fick cardiac output/index (using PA sat) = 9.2/4.45 Thermo CO/CI = 10.0/4.87 PVR = 2.2 WU Fick cardiac output/index (using high SVC sat) = 5.2/2.5 Pulse-ox saturation = 89%  High SVC sat = 54% Low SVC sat = 81% (at SVC/RA junction) RA sat = 68% RV sat = 66% PA sat = 68%, 69% IVC sat =56%   EF 60%. RV dilated with moderately reduced systolic function. RVSP 70. + bubble.  VQ/CT negative for PE   TEE 10/15 small PFO  Ab u/s 6/16 liver normal + ascites. Medico renal kidney disease.  Past Medical History  Diagnosis Date  . Hx of adenomatous colonic polyps   . Diabetes mellitus type II   . Hypertension   . Diverticulosis 2001  . Hyperlipidemia   . [place]     a. s/p MAZE 04/2013 in setting of CABG. b. Amio stopped in 10/2013 after flutter ablation.  . Obstructive sleep apnea     compliant with CPAP  . Chronic diastolic congestive heart failure ([place]   . Hypertensive cardiomyopathy ([place]   .  History of cardioversion     x3 (years uncertain)  . H/O hiatal hernia   . Basal cell carcinoma   . Adrenal adenoma   . CAD (coronary artery disease)     a. 04/2013 CABG x 2: LIMA to LAD, SVG to RI, EVH via R thigh.  . S/P Maze operation for atrial fibrillation     a. 04/2013: Complete bilateral atrial lesion set using cryothermy and bipolar radiofrequency ablation with clipping of LA appendage (@ time of CABG)  . GERD (gastroesophageal reflux disease)   . Depression   . Anxiety   . DJD (degenerative joint disease)   . Atypical atrial flutter ([place]) 8/15, 10/15    a. DCCV 08/2013. b. s/p RFA 10/2013.  [name] Respiratory failure ([place])     a. Hypoxia 10/2013 - required supp O2 as inpatient, did not require it at discharge.  .  Pleural effusion, left     a. s/p thoracentesis 05/2013.  [name] PFO (patent foramen ovale)     a. Small PFO by TEE 10/2013.  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 S/P Maze operation for atrial fibrillation 04/05/2013    Complete bilateral atrial lesion set using cryothermy and bipolar radiofrequency ablation with clipping of LA appendage via median sternotomy approach     Current Outpatient Prescriptions  Medication Sig Dispense Refill  . atorvastatin (LIPITOR) 40 MG tablet Take 20 mg by mouth every Monday, Wednesday, and Friday.     . B Complex Vitamins (VITAMIN B COMPLEX PO) Take 1 tablet by mouth daily.    [name] Besifloxacin HCl 0.6 % SUSP Place 1 drop into the left eye See admin instructions. Use 1.5 days a month starting the day after get injection into the eye    . Blood Glucose Monitoring Suppl (ACCU-CHEK AVIVA PLUS) W/DEVICE KIT Check blood sugar 1 time daily-DX-E11.29 1 kit 0  . carvedilol (COREG) 6.25 MG tablet TAKE 1 TABLET (6.25 MG TOTAL) BY MOUTH 2 (TWO) TIMES DAILY. 60 tablet 1  . cholecalciferol (VITAMIN D) 1000 UNITS tablet Take 4,000-5,000 Units by mouth See admin instructions. Take 4 tablets four times a week and take 5 tablets three times a week    . diphenhydrAMINE (BENADRYL) 25 MG tablet Take 25 mg by mouth at bedtime as needed for allergies.     [name] glimepiride (AMARYL) 2 MG tablet Take 1 mg by mouth See admin instructions. Take 1/2 tablet daily unless blood sugar is > 120    . glucose blood (ACCU-CHEK AVIVA PLUS) test strip Check blood glucose 1 time daily.   DX-E11.29 100 each 12  . HYDROcodone-acetaminophen (NORCO/VICODIN) 5-325 MG per tablet Take 1 tablet by mouth every 6 (six) hours as needed for moderate pain. 30 tablet   . metFORMIN (GLUCOPHAGE-XR) 500 MG 24 hr tablet Take 2 tablets (1,000 mg total) by mouth 2 (two) times daily. 360 tablet 4  . metolazone (ZAROXOLYN)  5 MG tablet Take 5 mg by mouth daily as needed (weight gain and edema).    . Multiple Vitamin (MULTIVITAMIN WITH MINERALS) TABS tablet Take 1 tablet by mouth daily.    . OXYGEN Inhale 2 L into the lungs as needed.     . potassium chloride SA (K-DUR,KLOR-CON) 20 MEQ tablet Take 2 tablets (40 mEq total) by mouth daily. 90 tablet 3  . ranitidine (ZANTAC) 150 MG tablet Take 150 mg by mouth daily as needed  for heartburn.     . sertraline (ZOLOFT) 100 MG tablet TAKE 1 TABLET BY MOUTH DAILY 30 tablet 11  . spironolactone (ALDACTONE) 25 MG tablet Take 1 tablet (25 mg total) by mouth daily. 90 tablet 3  . torsemide (DEMADEX) 100 MG tablet Take 100 mg in AM and 50 mg in PM    . valsartan (DIOVAN) 320 MG tablet Take 160 mg by mouth daily.    [name] warfarin (COUMADIN) 5 MG tablet TAKE 1 TABLET BY MOUTH TWICE A DAY 180 tablet 3  . ALPRAZolam (XANAX) 1 MG tablet Take 0.5 mg by mouth at bedtime as needed for sleep. Reported on 01/10/2015     No current facility-administered medications for this encounter.    Allergies  Allergen Reactions  . Sunflower Seed [Sunflower Oil] Swelling and Other (See Comments)    Tongue and lip swelling  . Horse-Derived Products Other (See Comments)    Per allergy skin test  . Tetanus Toxoids Other (See Comments)    Per allergy skin test      Social History   Social History  . Marital Status: Married    Spouse Name: N/A  . Number of Children: 1  . Years of Education: N/A   Occupational History  . retired Software engineer    Social History Main Topics  . Smoking status: Former Smoker -- 4.00 packs/day for 25 years    Types: Cigarettes    Quit date: 01/05/1981  . Smokeless tobacco: Never Used  . Alcohol Use: Yes     Comment: 1-5 drinks per week  . Drug Use: No  . Sexual Activity: Not on file   Other Topics Concern  . Not on file   Social History Narrative   Daily caffeine-yes   Patient gets regular exercise.   Pt lives in [place] with spouse.  Retired  Software engineer.  Family History  Problem Relation Age of Onset  . Colon cancer Mother     Family History/Uncle   . Colon polyps Mother     Family History  . Atrial fibrillation Mother   . Hypertension Mother   . Colon polyps Sister     Family history  . Diabetes Maternal Uncle   . Stroke Paternal Uncle   . Dementia Father     Danley Danker Vitals:   01/10/15 1028  BP: 112/62  Pulse: 68  Weight: 181 lb 4 oz (82.214 kg)  SpO2: 97%    PHYSICAL EXAM: General: Looks great.  No resp difficulty HEENT: normal Neck: supple. JVP 7. Carotids 2+ bilat; no bruits. No thyromegaly or nodule noted Cor: PMI nondisplaced. RRR. 2/6 TR Lungs: CTAB, normal effort Abdomen: soft, NT, ND, no HSM noted. No bruits or masses. +BS Extremities: no cyanosis, clubbing, rash, edema Neuro: alert & orientedx3, cranial nerves grossly intact. moves  all 4 extremities w/o difficulty. Affect pleasant  ASSESSMENT  1. 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 RV failure 3. Chronic respiratory failure 4. Pulmonary HTN 5. 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 Ascites 7. CAD s/p CABG 8. AF s/p Maze   --on coumadin 9. AFL s/p ablation 10/15 10. CKD, stage 3 11. HTN  PLAN  Looks great.  Much improved. Now euvolemic. Echo reviewed personally and PA pressures down 50% from previous. RV dilation expected due to high R-side flow from anomalous PV. Will continue current regimen. We discussed need to keep weight in tight range. Will watch renal function closely with monthly BMETs. Avoid overdiuresis. Remains on coumadin for PAF has tried Eliquis in past but had too much bleeding. Pulmonary Rehab pending - we called to check on status.    Haruo Stepanek,MD 11:19 AM

## 2015-01-10 NOTE — Progress Notes (Signed)
  Echocardiogram 2D Echocardiogram has been performed.  [name] 01/10/2015, 10:10 AM

## 2015-01-11 DIAGNOSIS — I2510 Atherosclerotic heart disease of native coronary artery without angina pectoris: Secondary | ICD-10-CM | POA: Diagnosis not present

## 2015-01-11 DIAGNOSIS — I480 Paroxysmal atrial fibrillation: Secondary | ICD-10-CM | POA: Diagnosis not present

## 2015-01-11 DIAGNOSIS — N183 Chronic kidney disease, stage 3 (moderate): Secondary | ICD-10-CM

## 2015-01-11 DIAGNOSIS — E1122 Type 2 diabetes mellitus with diabetic chronic kidney disease: Secondary | ICD-10-CM | POA: Insufficient documentation

## 2015-01-16 ENCOUNTER — Other Ambulatory Visit: Payer: Self-pay

## 2015-01-16 NOTE — Patient Outreach (Addendum)
[facility]) Care Management  01/16/2015  [name] [birth year] [id number]  Telephonic Monthly Assessment   Outreach call #2. Patient not reached.  RN CM left HIPAA compliant voice message with name and number.  RN CM will schedule for next outreach call within one week.   Mariann Laster, RN, BSN, [facility], CCM  [employer] Management Coordinator [phone] Direct [phone] Cell [phone] Office [phone] Fax

## 2015-01-18 ENCOUNTER — Other Ambulatory Visit: Payer: Self-pay

## 2015-01-18 NOTE — Patient Outreach (Addendum)
[name]  01/18/2015  [name] April 09, 1940 [id number]  Telephonic Monthly Assessment     (Patient prefers to be contacted via cell # [phone]).  Providers: Primary MD: Dr. [name] - last appt 01/08/2015 CHF Clinic: Dr. Shaune Pascal Bensimhon - last appt 01/10/15 - next appt 3 months (04/2015) or as needed. Cardiologist: Dr. Cathie Olden: Last appt 11/28/14.  Pulmonologist: Juanito Doom, MD - last appt 10/15/2014 Opthamologist: Dr. Zigmund Daniel: Next appt 12/18/14 Insurance: UHC/AARP Medicare Complete  Social:  Patient lives in his home with his wife of 93 years.  Mobility: Ambulates with oxygen to manage shortness of breath but requires no other assistance. States able to walk a certain distance but will then need to stop to recover shortness of breath.  Falls: None Caregiver: wife/[name]. Advanced Directives:  None  DME: BP cuff, scales, Oxygen ([facility]), CPap ([employer]).   CHF  Patient under CHF Clinic: Dr. Clayborne Dana services.  Patient is avoiding salt, caffeine and reports improved compliance with self management interventions.  Patient verbalized clear understanding of yellow zone/call your doctor today guidelines.  OP Pulmonary Rehab orientation date:  01/31/2014.  Morning weight:  174 (up 3 lbs over the past one month)  Medications: Taking as ordered and has no questions.   Consent: Patient agreed to [facility] services.  Patient received [facility] consent but has not signed or returned.   Plan: Referral Date: 10/22/14 Screening 11/01/14 Telephonic RN CM services 11/01/14 Program: CHF Initial Assessment 12/11/15  CHF RN CM advised to weigh daily and BP readings and log; take log to every MD appt. Encouraged to use [facility] calendar or any other calendar that works and reminds him to log.  RN CM reviewed YELLOW and RED HF Zones; advised to report any symptoms which present and notify [name].     Educational Materials mailed 12/11/2014 and reviewed 01/18/2015 -Heart Failure Green, Yellow, Red Chart -Know Before You Go -Low/High Salt Foods chart -[place] / Calendar Book  RN CM instructed in next contact call within the next 30 days for Telephonic Monthly Assessment and care coordination services as needed.  RN CM will continue to assess for  -compliance with OP Pulmonary Rehab services -weigh management; encouraged to work over the next month to get the 3 lbs off patient gained over the past month.  Encouraged to pay close attention to low salt diet needs and tips provided.  -worsening shortness of breath -compliance and remembering to take medications on time and daily.   RN CM advised to please notify MD of any changes in condition prior to scheduled appt's.  RN CM provided contact name and # [phone] or main office # [phone] and 24-hour nurse line # [phone].  RN CM confirmed patient is aware of 911 services for urgent emergency needs. (especially difficulty breathing)  Mariann Laster, RN, BSN, [facility], [place] Management Care Management Coordinator [phone] Direct [phone] Cell [phone] Office [phone] Fax

## 2015-01-24 ENCOUNTER — Ambulatory Visit (INDEPENDENT_AMBULATORY_CARE_PROVIDER_SITE_OTHER): Payer: Medicare Other | Admitting: *Deleted

## 2015-01-24 DIAGNOSIS — Z7901 Long term (current) use of anticoagulants: Secondary | ICD-10-CM

## 2015-01-24 DIAGNOSIS — I4892 Unspecified atrial flutter: Secondary | ICD-10-CM | POA: Diagnosis not present

## 2015-01-24 DIAGNOSIS — Z5181 Encounter for therapeutic drug level monitoring: Secondary | ICD-10-CM | POA: Diagnosis not present

## 2015-01-24 DIAGNOSIS — Z79899 Other long term (current) drug therapy: Secondary | ICD-10-CM | POA: Diagnosis not present

## 2015-01-24 LAB — BASIC METABOLIC PANEL
BUN: 30 mg/dL — AB (ref 7–25)
CHLORIDE: 96 mmol/L — AB (ref 98–110)
CO2: 30 mmol/L (ref 20–31)
Calcium: 9.8 mg/dL (ref 8.6–10.3)
Creat: 1.35 mg/dL — ABNORMAL HIGH (ref 0.70–1.18)
Glucose, Bld: 255 mg/dL — ABNORMAL HIGH (ref 65–99)
POTASSIUM: 4.1 mmol/L (ref 3.5–5.3)
SODIUM: 137 mmol/L (ref 135–146)

## 2015-01-24 NOTE — Addendum Note (Signed)
Addended by: Starlyn Skeans A on: 01/24/2015 02:08 PM   Modules accepted: Orders

## 2015-01-25 ENCOUNTER — Encounter: Payer: Self-pay | Admitting: Internal Medicine

## 2015-01-25 ENCOUNTER — Other Ambulatory Visit: Payer: Self-pay | Admitting: Internal Medicine

## 2015-01-25 LAB — PROTIME-INR
INR: 2.77 — AB (ref <1.50)
Prothrombin Time: 29.7 seconds — ABNORMAL HIGH (ref 11.6–15.2)

## 2015-02-01 ENCOUNTER — Encounter (HOSPITAL_COMMUNITY)
Admission: RE | Admit: 2015-02-01 | Discharge: 2015-02-01 | Disposition: A | Discharge status: Home / Self Care | Payer: Medicare Other | Source: Ambulatory Visit | Attending: Cardiology | Admitting: Cardiology

## 2015-02-01 ENCOUNTER — Encounter (HOSPITAL_COMMUNITY): Payer: Self-pay

## 2015-02-01 VITALS — BP 106/68 | HR 85 | Resp 18 | Ht 70.0 in | Wt 179.9 lb

## 2015-02-01 DIAGNOSIS — I272 Other secondary pulmonary hypertension: Secondary | ICD-10-CM | POA: Diagnosis not present

## 2015-02-01 NOTE — Progress Notes (Signed)
[name] 75 y.o. male Pulmonary Rehab Orientation Note Patient arrived today in Cardiac and Pulmonary Rehab for orientation to Pulmonary Rehab. He ambulated from the [facility] without difficulty. He does not carry portable oxygen. Per pt, he has not used his home oxygen since his hospitalization, but his cardiologist requested he not give it back until he completes pulmonary rehab. His resting saturations were 99% on RA. Color good, skin warm and dry. Patient is oriented to time and place. Patient's medical history, psychosocial health, and medications reviewed. Psychosocial assessment reveals pt lives with their spouse. Pt is currently retired. Pt hobbies include gardening, woodworking, fishing from his boat, and photography. He has not been able to do these things since his open heart surgery in August 2015. Pt reports his stress level is low. Areas of stress/anxiety include Health.  Pt does not exhibit signs of depression. PHQ2/9 score 0/na. Pt shows good  coping skills with positive outlook . He was offered emotional support and reassurance. Will continue to monitor and evaluate progress toward psychosocial goal(s) of continuing to maintain a positive attitude about his diagnosis of pulmonary hypertension. Physical assessment reveals heart rate is normal, S1S2 present. Breath sounds clear but deminished. Course rhonchi heard in the left lower lobe. Does not clear with cough. Grip strength equal, strong. Distal pulses palpable. Trace amount of pitting edema to ankles bilat. Patient reports he does take medications as prescribed. Patient states he follows a Low Sodium diet, fluid restricted, diabetic diet. The patient reports no specific efforts to gain or lose weight. He has lost a significant amount of weight since his surgical hospitalization.  Patient's weight will be monitored closely. Demonstration and practice of PLB using pulse oximeter. Patient able to return demonstration  satisfactorily. Safety and hand hygiene in the exercise area reviewed with patient. Patient voices understanding of the information reviewed. Department expectations discussed with patient and achievable goals were set. The patient shows enthusiasm about attending the program and we look forward to working with this nice gentleman. The patient is scheduled for a 6 min walk test on Tuesday 1/31 at 3:30 and to begin exercise on Thursday 2/2 in the 10:30 class.   45 minutes was spent on a variety of activities such as assessment of the patient, obtaining baseline data including height, weight, BMI, and grip strength, verifying medical history, allergies, and current medications, and teaching patient strategies for performing tasks with less respiratory effort with emphasis on pursed lip breathing.

## 2015-02-03 ENCOUNTER — Other Ambulatory Visit: Payer: Self-pay | Admitting: Cardiovascular Disease

## 2015-02-03 ENCOUNTER — Other Ambulatory Visit: Payer: Self-pay | Admitting: Internal Medicine

## 2015-02-05 ENCOUNTER — Encounter (HOSPITAL_COMMUNITY)
Admission: RE | Admit: 2015-02-05 | Discharge: 2015-02-05 | Disposition: A | Discharge status: Home / Self Care | Payer: Medicare Other | Source: Ambulatory Visit | Attending: Cardiology | Admitting: Cardiology

## 2015-02-05 ENCOUNTER — Encounter (INDEPENDENT_AMBULATORY_CARE_PROVIDER_SITE_OTHER): Payer: Medicare Other | Admitting: Ophthalmology

## 2015-02-05 DIAGNOSIS — I272 Other secondary pulmonary hypertension: Secondary | ICD-10-CM | POA: Diagnosis not present

## 2015-02-05 DIAGNOSIS — I10 Essential (primary) hypertension: Secondary | ICD-10-CM

## 2015-02-05 DIAGNOSIS — H43813 Vitreous degeneration, bilateral: Secondary | ICD-10-CM | POA: Diagnosis not present

## 2015-02-05 DIAGNOSIS — E113393 Type 2 diabetes mellitus with moderate nonproliferative diabetic retinopathy without macular edema, bilateral: Secondary | ICD-10-CM | POA: Diagnosis not present

## 2015-02-05 DIAGNOSIS — H348322 Tributary (branch) retinal vein occlusion, left eye, stable: Secondary | ICD-10-CM

## 2015-02-05 DIAGNOSIS — E11319 Type 2 diabetes mellitus with unspecified diabetic retinopathy without macular edema: Secondary | ICD-10-CM

## 2015-02-05 DIAGNOSIS — H35033 Hypertensive retinopathy, bilateral: Secondary | ICD-10-CM

## 2015-02-05 NOTE — Progress Notes (Signed)
Rylan completed a Six-Minute Walk Test on 02/05/15 . Cordaryl walked 1226 feet with no rest breaks.  The patient's lowest oxygen saturation was 92 %, highest heart rate was 90 bpm , and highest blood pressure was 128/60. The patient was on room air. Patient stated that nothing hindered his walk test.   Alberteen Sam, MA, ACSM RCEP

## 2015-02-07 ENCOUNTER — Encounter (HOSPITAL_COMMUNITY)
Admission: RE | Admit: 2015-02-07 | Discharge: 2015-02-07 | Disposition: A | Discharge status: Home / Self Care | Payer: Medicare Other | Source: Ambulatory Visit | Attending: Cardiology | Admitting: Cardiology

## 2015-02-07 DIAGNOSIS — I272 Other secondary pulmonary hypertension: Secondary | ICD-10-CM | POA: Insufficient documentation

## 2015-02-07 NOTE — Progress Notes (Signed)
Today, Javarri exercised at [employer]. [facility]. Service time was from 1030 to 1220.  The patient exercised by performing aerobic, strengthening, and stretching exercises. Oxygen saturation, heart rate, blood pressure, rate of perceived exertion, and shortness of breath were all monitored before, during, and after exercise. [name] presented with no problems at today's exercise session. He attended class today on PLB, diaphragmatic breathing and pulmonary medications.  The patient did not have an increase in workload intensity during today's exercise session.  Pre-exercise vitals: . Weight kg: 83.3 . Liters of O2: RA . SpO2: 98 . HR: 71 . BP: 90/50 . CBG: 254  Exercise vitals: . Highest heartrate:  78 . Lowest oxygen saturation: 94 . Highest blood pressure: 110/60 . Liters of 02: RA  Post-exercise vitals: . SpO2: 98 . HR: 62 . BP: 96/53 . Liters of O2: RA . CBG: 204 .     Dr. Rush Farmer, Medical Director Dr. Jerilee Hoh is immediately available during today's Pulmonary Rehab session for [name] on 02/07/2015  at 1030 class time  .

## 2015-02-08 ENCOUNTER — Ambulatory Visit (INDEPENDENT_AMBULATORY_CARE_PROVIDER_SITE_OTHER): Payer: Medicare Other | Admitting: Internal Medicine

## 2015-02-08 ENCOUNTER — Encounter: Payer: Self-pay | Admitting: Internal Medicine

## 2015-02-08 VITALS — BP 118/66 | HR 76 | Temp 97.7°F | Resp 16 | Ht 71.0 in | Wt 186.0 lb

## 2015-02-08 DIAGNOSIS — N183 Chronic kidney disease, stage 3 unspecified: Secondary | ICD-10-CM

## 2015-02-08 DIAGNOSIS — Z0000 Encounter for general adult medical examination without abnormal findings: Secondary | ICD-10-CM | POA: Diagnosis not present

## 2015-02-08 DIAGNOSIS — E559 Vitamin D deficiency, unspecified: Secondary | ICD-10-CM

## 2015-02-08 DIAGNOSIS — E1122 Type 2 diabetes mellitus with diabetic chronic kidney disease: Secondary | ICD-10-CM

## 2015-02-08 DIAGNOSIS — Z7901 Long term (current) use of anticoagulants: Secondary | ICD-10-CM

## 2015-02-08 DIAGNOSIS — E782 Mixed hyperlipidemia: Secondary | ICD-10-CM

## 2015-02-08 DIAGNOSIS — G4733 Obstructive sleep apnea (adult) (pediatric): Secondary | ICD-10-CM

## 2015-02-08 DIAGNOSIS — Z79899 Other long term (current) drug therapy: Secondary | ICD-10-CM | POA: Diagnosis not present

## 2015-02-08 DIAGNOSIS — I10 Essential (primary) hypertension: Secondary | ICD-10-CM | POA: Diagnosis not present

## 2015-02-08 DIAGNOSIS — Z0001 Encounter for general adult medical examination with abnormal findings: Secondary | ICD-10-CM

## 2015-02-08 DIAGNOSIS — K2100 Gastro-esophageal reflux disease with esophagitis, without bleeding: Secondary | ICD-10-CM

## 2015-02-08 DIAGNOSIS — Z9989 Dependence on other enabling machines and devices: Secondary | ICD-10-CM

## 2015-02-08 DIAGNOSIS — I5033 Acute on chronic diastolic (congestive) heart failure: Secondary | ICD-10-CM

## 2015-02-08 DIAGNOSIS — J984 Other disorders of lung: Secondary | ICD-10-CM

## 2015-02-08 DIAGNOSIS — I272 Pulmonary hypertension, unspecified: Secondary | ICD-10-CM

## 2015-02-08 DIAGNOSIS — F329 Major depressive disorder, single episode, unspecified: Secondary | ICD-10-CM

## 2015-02-08 DIAGNOSIS — F32A Depression, unspecified: Secondary | ICD-10-CM

## 2015-02-08 DIAGNOSIS — Z1331 Encounter for screening for depression: Secondary | ICD-10-CM

## 2015-02-08 DIAGNOSIS — Z951 Presence of aortocoronary bypass graft: Secondary | ICD-10-CM

## 2015-02-08 LAB — CBC WITH DIFFERENTIAL/PLATELET
BASOS PCT: 0 % (ref 0–1)
Basophils Absolute: 0 10*3/uL (ref 0.0–0.1)
EOS ABS: 0.2 10*3/uL (ref 0.0–0.7)
Eosinophils Relative: 2 % (ref 0–5)
HCT: 37.6 % — ABNORMAL LOW (ref 39.0–52.0)
HEMOGLOBIN: 12.3 g/dL — AB (ref 13.0–17.0)
Lymphocytes Relative: 12 % (ref 12–46)
Lymphs Abs: 0.9 10*3/uL (ref 0.7–4.0)
MCH: 29.3 pg (ref 26.0–34.0)
MCHC: 32.7 g/dL (ref 30.0–36.0)
MCV: 89.5 fL (ref 78.0–100.0)
MONO ABS: 0.7 10*3/uL (ref 0.1–1.0)
MPV: 8.9 fL (ref 8.6–12.4)
Monocytes Relative: 9 % (ref 3–12)
NEUTROS ABS: 6 10*3/uL (ref 1.7–7.7)
Neutrophils Relative %: 77 % (ref 43–77)
PLATELETS: 190 10*3/uL (ref 150–400)
RBC: 4.2 MIL/uL — AB (ref 4.22–5.81)
RDW: 16.4 % — AB (ref 11.5–15.5)
WBC: 7.8 10*3/uL (ref 4.0–10.5)

## 2015-02-08 LAB — BASIC METABOLIC PANEL WITH GFR
BUN: 38 mg/dL — ABNORMAL HIGH (ref 7–25)
CALCIUM: 9.1 mg/dL (ref 8.6–10.3)
CO2: 30 mmol/L (ref 20–31)
Chloride: 98 mmol/L (ref 98–110)
Creat: 1.57 mg/dL — ABNORMAL HIGH (ref 0.70–1.18)
GFR, EST AFRICAN AMERICAN: 49 mL/min — AB (ref >=60)
GFR, EST NON AFRICAN AMERICAN: 42 mL/min — AB (ref >=60)
Glucose, Bld: 262 mg/dL — ABNORMAL HIGH (ref 65–99)
Potassium: 4.2 mmol/L (ref 3.5–5.3)
SODIUM: 137 mmol/L (ref 135–146)

## 2015-02-08 LAB — PROTIME-INR
INR: 3.38 — AB (ref <1.50)
PROTHROMBIN TIME: 34.7 s — AB (ref 11.6–15.2)

## 2015-02-08 MED ORDER — GLIMEPIRIDE 4 MG PO TABS: ORAL_TABLET | ORAL | Status: DC

## 2015-02-08 MED ORDER — GLUCOSE BLOOD VI STRP: ORAL_STRIP | Status: DC

## 2015-02-08 MED ORDER — GLUCOSE BLOOD VI STRP
ORAL_STRIP | Status: DC
Start: 2015-02-08 — End: 2016-04-06

## 2015-02-08 NOTE — Patient Instructions (Signed)

## 2015-02-08 NOTE — Progress Notes (Addendum)
Patient ID: [name], male   DOB: 05/15/1940, 75 y.o.   MRN: [id number]  Annual  Screening/Preventative Visit And Comprehensive Evaluation & Examination  This very nice 75 y.o.male presents for presents for a Wellness/Preventative Visit & comprehensive evaluation and management of multiple medical co-morbidities.  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   HTN predates since 28.Today's BP: 118/66 mmHg. Patient has hx/o ASCAD and pAfib/flutter & in Apr 2015 underwent CABG w/Maze  in 2015 underwent RFA for AFL. 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 In Aug 2015 he had a CV and in Aug a RFA for Aflutter.  Patient had decompensated with consequent ascites felt cardiac and with [name]'s management has improved dramatically with resolution of his ascites and is off of O2 with resting sat's about 96-97 and dropping only to 93% with activity.    Patient's hyperlipidemia is controlled with diet and medications. Patient denies myalgias or other medication SE's. Last lipids were 01/08/2015: Cholesterol 161; HDL 25*; LDL 91; but with elevatedTriglycerides 224 on    Patient has T2_NIDDM since 1995 initially controlled with diet & the treatment started in 2003 with Metformin. He does have Stage 3 Diabetic Kidney Dz.   He does report over the last month CBG's are running in the 200's. Patient denies reactive hypoglycemic symptoms, visual blurring, diabetic polys or paresthesias. Last A1c was high at 8.5% on 01/08/2015.   Finally, patient has history of Vitamin D Deficiency of "1" in 2008 and last vitamin D recently was 52 on 01/08/2015.    Medication Sig  . ALPRAZolam  1 MG tablet Take 0.5 mg by mouth at bedtime as needed for sleep. Reported on 01/10/2015  .  atorvastatin  40 MG tablet TAKE 1 TABLET BY MOUTH EVERY DAY  . VITAMIN B COMPLEX  Take 1 tablet by mouth daily.  [name] Besifloxacin  0.6 % SUSP Place 1 drop into the left eye See admin instructions. Use 1.5 days a month starting the day after get injection into the eye  . carvedilol  6.25 MG ta TAKE 1 TABLET (6.25 MG TOTAL) BY MOUTH 2 (TWO) TIMES DAILY.  [name] VITAMIN D 1000 UNITS  Take 4,000-5,000 Units by mouth See admin instructions. Take 4 tablets four times a week and take 5 tablets three times a week  . diphenhydrAMINE  25 MG  Take 25 mg by mouth at bedtime as needed for allergies.   [name] 5-325 Take 1 tablet by mouth every 6 (six) hours as needed for moderate pain.  . metFORMIN -XR 500 MG  Take 2 tablets (1,000 mg total) by mouth 2 (two) times daily.  . metolazone  5 MG  Take 5 mg by mouth daily as needed (weight gain and edema).  . MULTIVITAMIN WITH MINERALS Take 1 tablet by mouth daily.  . OXYGEN Inhale 2 L into the lungs as needed. - off of O2 & O2 sts running 96-97% at rest & 93% with activity  . K-DUR  20 MEQ  Take 2 tablets (40 mEq total) by mouth daily. (Patient taking differently: Take 20 mEq by mouth 2 (two) times daily. )  . ranitidine  150 MG  Take 150 mg by mouth daily as needed for heartburn.   [name]  sertraline  100 MG  TAKE 1 TABLET BY MOUTH DAILY  . spironolactone  25 MG  Take 1 tablet (25 mg total) by mouth daily.  [name] torsemide  100 MG  Take 100 mg in AM and 50 mg in PM if needed for weight gain  . valsartan  320 MG  TAKE 1 TAB DAILY.  [name] warfarin  5 MG Patient taking differently: 10mg  Monday, Wednesday, Friday, 12.5 mg Sunday, Tuesday, Thurs, Sat)    Allergies  Allergen Reactions  . Sunflower Seed [Sunflower Oil] Swelling and Other (See Comments)    Tongue and lip swelling  . Horse-Derived Products Other (See Comments)    Per allergy skin test  . Tetanus Toxoids Other (See Comments)    Per allergy skin test   Past Medical History  Diagnosis Date  . Hx of adenomatous colonic  polyps   . Diabetes mellitus type II   . Hypertension   . Diverticulosis 2001  . Hyperlipidemia   . Persistent atrial fibrillation ([place])     a. s/p MAZE 04/2013 in setting of CABG. b. Amio stopped in 10/2013 after flutter ablation.  . Obstructive sleep apnea     compliant with CPAP  . Chronic diastolic congestive heart failure ([place])   . Hypertensive cardiomyopathy ([place])   . History of cardioversion     x3 (years uncertain)  . H/O hiatal hernia   . Basal cell carcinoma   . Adrenal adenoma   . CAD (coronary artery disease)     a. 04/2013 CABG x 2: LIMA to LAD, SVG to RI, EVH via R thigh.  . S/P Maze operation for atrial fibrillation     a. 04/2013: Complete bilateral atrial lesion set using cryothermy and bipolar radiofrequency ablation with clipping of LA appendage (@ time of CABG)  . GERD (gastroesophageal reflux disease)   . Depression   . Anxiety   . DJD (degenerative joint disease)   . Atypical atrial flutter ([place]) 8/15, 10/15    a. DCCV 08/2013. b. s/p RFA 10/2013.  [name] Respiratory failure ([place])     a. Hypoxia 10/2013 - required supp O2 as inpatient, did not require it at discharge.  . Pleural effusion, left     a. s/p thoracentesis 05/2013.  [name] PFO (patent foramen ovale)     a. Small PFO by TEE 10/2013.  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 S/P Maze operation for atrial fibrillation 04/05/2013    Complete bilateral atrial lesion set using cryothermy and bipolar radiofrequency ablation with clipping of LA appendage via median sternotomy approach    Health Maintenance  Topic Date Due  . OPHTHALMOLOGY EXAM  03/31/1949  . ZOSTAVAX  04/01/1999  . TETANUS/TDAP  01/05/2010  . FOOT EXAM  01/31/2015  . HEMOGLOBIN A1C  07/08/2015  . INFLUENZA VACCINE  08/06/2015  . COLONOSCOPY  12/10/2016  . PNA vac Low Risk Adult  Completed   Immunization History  Administered Date(s)  Administered  . Influenza Split 10/25/2012  . Influenza, High Dose Seasonal PF 09/26/2013, 09/27/2014  . Pneumococcal Conjugate-13 01/30/2014  . Pneumococcal Polysaccharide-23 10/20/2011  . Td 01/06/2000   Past Surgical History  Procedure Laterality Date  . Polinydal cyst      Removed  . Great toe arthrodesis, interphalangeal joint      Right foot  . Retina repair-right    . Cataract extraction  bilateral  . Basal cell carcinoma excision      x3 on face  . Polypectomy    . Cardiac catheterization      myocardial bridge but no cad  . Coronary artery bypass graft N/A 04/05/2013    Procedure: CORONARY ARTERY BYPASS GRAFTING (CABG) TIMES TWO USING LEFT INTERNAL MAMMARY ARTERY AND RIGHT SAPHENOUS LEG VEIN HARVESTED ENDOSCOPICALLY;  Surgeon: Rexene Alberts, MD;  Location: [facility];  Service: Open Heart Surgery;  Laterality: N/A;  . Maze N/A 04/05/2013    Procedure: MAZE;  Surgeon: Rexene Alberts, MD;  Location: [facility];  Service: Open Heart Surgery;  Laterality: N/A;  . Intraoperative transesophageal echocardiogram N/A 04/05/2013    Procedure: INTRAOPERATIVE TRANSESOPHAGEAL ECHOCARDIOGRAM;  Surgeon: Rexene Alberts, MD;  Location: [facility];  Service: Open Heart Surgery;  Laterality: N/A;  . Tee without cardioversion N/A 08/23/2013    Procedure: TRANSESOPHAGEAL ECHOCARDIOGRAM (TEE);  Surgeon: Sanda Klein, MD;  Location: [place];  Service: Cardiovascular;  Laterality: N/A;  . Cardioversion N/A 08/23/2013    Procedure: CARDIOVERSION;  Surgeon: Sanda Klein, MD;  Location: [facility] ENDOSCOPY;  Service: Cardiovascular;  Laterality: N/A;  . Tee without cardioversion N/A 10/26/2013    Procedure: TRANSESOPHAGEAL ECHOCARDIOGRAM (TEE);  Surgeon: Sueanne Margarita, MD;  Location: [facility] ENDOSCOPY;  Service: Cardiovascular;  Laterality: N/A;  . Left heart catheterization with coronary angiogram N/A 03/07/2013    Procedure: LEFT HEART CATHETERIZATION WITH CORONARY ANGIOGRAM;  Surgeon: Burnell Blanks, MD;   Location: [facility] CATH LAB;  Service: Cardiovascular;  Laterality: N/A;  . Atrial fibrillation ablation N/A 10/26/2013    Procedure: ATRIAL FIBRILLATION ABLATION;  Surgeon: Coralyn Mark, MD;  Location: [place] CATH LAB;  Service: Cardiovascular;  Laterality: N/A;  . Carpometacarpel suspension plasty Left 02/14/2014    Procedure: CARPOMETACARPEL ([place]) SUSPENSIONPLASTY THUMB  WITH  ABDUCTOR POLLICIS LONGUS TRANSFER AND STENOSING TENOSYNOVITIS RELEASE LEFT WRIST;  Surgeon: Charlotte Crumb, MD;  Location: [facility] [facility];  Service: Orthopedics;  Laterality: Left;  . Trapezium resection    . Right heart catheterization N/A 05/03/2014    Procedure: RIGHT HEART CATH;  Surgeon: Jolaine Artist, MD;  Location: [facility] CATH LAB;  Service: Cardiovascular;  Laterality: N/A;  . Tee without cardioversion N/A 06/05/2014    Procedure: TRANSESOPHAGEAL ECHOCARDIOGRAM (TEE);  Surgeon: Thayer Headings, MD;  Location: [facility] ENDOSCOPY;  Service: Cardiovascular;  Laterality: N/A;   Family History  Problem Relation Age of Onset  . Colon cancer Mother     Family History/Uncle   . Colon polyps Mother     Family History  . Atrial fibrillation Mother   . Hypertension Mother   . Colon polyps Sister     Family history  . Diabetes Maternal Uncle   . Stroke Paternal Uncle   . Dementia Father    Social History   Social History  . Marital Status: Married    Spouse Name: N/A  . Number of Children: 1  . Years of Education: N/A   Occupational History  . retired Software engineer    Social History Main Topics  . Smoking status: Former Smoker -- 4.00 packs/day for 25 years    Types: Cigarettes    Quit date: 01/05/1981  . Smokeless tobacco: Never Used  . Alcohol Use: 0.6 oz/week    1 Shots of liquor per week  - Comment: 1-5 drinks per week  . Drug Use: No  . Sexual Activity: Not on file   Social History Narrative   Daily caffeine-yes  Patient gets regular exercise.   Pt lives in [place] with spouse.                                                                      ROS Constitutional: Denies fever, chills, weight loss/gain, headaches, insomnia,  night sweats or change in appetite. Does c/o fatigue. Eyes: Denies redness, blurred vision, diplopia, discharge, itchy or watery eyes.  ENT: Denies discharge, congestion, post nasal drip, epistaxis, sore throat, earache, hearing loss, dental pain, Tinnitus, Vertigo, Sinus pain or snoring.  Cardio: Denies chest pain, palpitations, irregular heartbeat, syncope, dyspnea, diaphoresis, orthopnea, PND, claudication or edema Respiratory: denies cough, dyspnea, DOE, pleurisy, hoarseness, laryngitis or wheezing.  Gastrointestinal: Denies dysphagia, heartburn, reflux, water brash, pain, cramps, nausea, vomiting, bloating, diarrhea, constipation, hematemesis, melena, hematochezia, jaundice or hemorrhoids Genitourinary: Denies dysuria, frequency, urgency, nocturia, hesitancy, discharge, hematuria or flank pain Musculoskeletal: Denies arthralgia, myalgia, stiffness, Jt. Swelling, pain, limp or strain/sprain. Denies Falls. Skin: Denies puritis, rash, hives, warts, acne, eczema or change in skin lesion Neuro: No weakness, tremor, incoordination, spasms, paresthesia or pain Psychiatric: Denies confusion, memory loss or sensory loss. Denies Depression. Endocrine: Denies change in weight, skin, hair change, nocturia, and paresthesia, diabetic polys, visual blurring or hyper / hypo glycemic episodes.  Heme/Lymph: No excessive bleeding, bruising or enlarged lymph nodes.  Physical Exam  BP 118/66 mmHg  Pulse 76  Temp(Src) 97.7 F (36.5 C)  Resp 16  Ht 5\' 11"  (1.803 m)  Wt 186 lb (84.369 kg)  BMI 25.95 kg/m2  General Appearance: Appears chronically ill & in no apparent distress. Eyes: PERRLA, EOMs, conjunctiva no swelling or erythema, normal fundi and vessels. Sinuses: No frontal/maxillary tenderness ENT/Mouth: EACs patent / TMs  nl. Nares clear without erythema, swelling,  mucoid exudates. Oral hygiene is good. No erythema, swelling, or exudate. Tongue normal, non-obstructing. Tonsils not swollen or erythematous. Hearing normal.  Neck: Supple, thyroid normal. No bruits, nodes or JVD. Respiratory: Respiratory effort normal.  BS equal and clear bilateral without rales, rhonci, wheezing or stridor. Cardio: Heart sounds are normal with regular rate and rhythm and no murmurs, rubs or gallops. Peripheral pulses are normal and equal bilaterally without edema. No aortic or femoral bruits. Chest: symmetric with normal excursions and percussion.  Abdomen: Soft, with Nl bowel sounds. Nontender, no guarding, rebound, hernias, masses, or organomegaly.  Lymphatics: Non tender without lymphadenopathy.  Genitourinary: No hernias.Testes nl. DRE - prostate nl for age - smooth & firm w/o nodules. Musculoskeletal: Full ROM all peripheral extremities, joint stability, 5/5 strength, and normal gait. Skin: Warm and dry without rashes, lesions, cyanosis, clubbing or  ecchymosis.  Neuro: Cranial nerves intact, reflexes equal bilaterally. Normal muscle tone, no cerebellar symptoms. Sensation intact.  Pysch: Alert and oriented X 3 with normal affect, insight and judgment appropriate.   Assessment and Plan  1. Annual Preventative/Screening Exam   1. Encounter for general adult medical examination with abnormal findings  - EKG 12-Lead - CBC with Differential/Platelet - BASIC METABOLIC PANEL WITH GFR - Protime-INR - glimepiride (AMARYL) 4 MG tablet; Take 1/2 to 1 tablet 2 x day for Diabetes  Dispense: 180 tablet; Refill: 99 - glucose blood (ACCU-CHEK AVIVA PLUS) test strip; Check blood glucose 3 xdaily.   DX-E11.29 - fluctuating glucoses -  uncontrolled Diabetes  Dispense: 300 each; Refill: 12  2. Essential hypertension   3. Hyperlipidemia  - EKG 12-Lead  4. Controlled type 2 diabetes mellitus with stage 3 chronic kidney disease, without long-term current use of insulin (HCC)  -  glimepiride (AMARYL) 4 MG tablet; Take 1/2 to 1 tablet 2 x day for Diabetes  Dispense: 180 tablet; Refill: 99 - glucose blood (ACCU-CHEK AVIVA PLUS) test strip; Check blood glucose 3 xdaily.   DX-E11.29 - fluctuating glucoses - uncontrolled Diabetes  Dispense: 300 each; Refill: 12  5. Vitamin D deficiency   6. GERD   7. 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   8. Acute on chronic diastolic CHF (congestive heart failure), NYHA class 3 ([place])   9. CKD (chronic kidney disease) stage 3, GFR 30-59 ml/min  - increasing Amaryl / glimepiride  to 4 mg at 1/2 to 1 tab bid til glucose less than 140.  10. Chronic restrictive lung disease   11. Pulmonary hypertension ([place])   12. OSA on CPAP   13. Depression screen  - Screen Negative  14. Depression, controlled   15. Chronic anticoagulation  - Protime-INR  16. Medication management  - CBC with Differential/Platelet - BASIC METABOLIC PANEL WITH GFR   Continue prudent diet as discussed, weight control, BP monitoring, regular exercise, and medications as discussed.  Discussed med effects and SE's. Routine screening labs and tests as requested with regular follow-up as recommended. Over 40 minutes of exam, counseling, chart review and high complex critical decision making was performed

## 2015-02-11 DIAGNOSIS — I480 Paroxysmal atrial fibrillation: Secondary | ICD-10-CM | POA: Diagnosis not present

## 2015-02-11 DIAGNOSIS — I2510 Atherosclerotic heart disease of native coronary artery without angina pectoris: Secondary | ICD-10-CM | POA: Diagnosis not present

## 2015-02-12 ENCOUNTER — Other Ambulatory Visit: Payer: Self-pay | Admitting: Internal Medicine

## 2015-02-12 ENCOUNTER — Encounter (HOSPITAL_COMMUNITY)
Admission: RE | Admit: 2015-02-12 | Discharge: 2015-02-12 | Disposition: A | Discharge status: Home / Self Care | Payer: Medicare Other | Source: Ambulatory Visit | Attending: Cardiology | Admitting: Cardiology

## 2015-02-12 NOTE — Progress Notes (Signed)
Pulmonary Rehab Progress Note: [name] presented to pulmonary rehab with a CBG of 421. He stated that his am fasting at 0924 was 308, he took 1000mg  of metformin and 2mg  of glimepiride. He preceded to eat a peanut butter sandwich with whole grain bread and had water to drink. As previously stated his CBG just prior to exercise at 1045 was 421. Notified and spoke with Dr. Glenda Chroman. Dr. Melford Aase requested patient begin taking 4mg  glimepiride daily starting when he gets home. Dr. Melford Aase also requested that patient be allowed to exercise. Informed patient that police states patients cant exercise if their CBG is greater than 300. Patient verbalized medication change, denied s/s of hyperglycemia, and was discharged home in stable condition denying complaints. Patient encouraged to continue to check his blood sugars frequently and notify MD with anyextreme highs or lows as previously discussed with MD at Fridays visit.

## 2015-02-13 ENCOUNTER — Telehealth (HOSPITAL_COMMUNITY): Payer: Self-pay | Admitting: Cardiology

## 2015-02-13 NOTE — Telephone Encounter (Signed)
[name] heart failure program Patient enrolled will need most recent OV with BP and HR And most recent echo/ EF  OV and echo done on 01/10/15 faxed to fax number provided

## 2015-02-14 ENCOUNTER — Encounter (HOSPITAL_COMMUNITY)
Admission: RE | Admit: 2015-02-14 | Discharge: 2015-02-14 | Disposition: A | Discharge status: Home / Self Care | Payer: Medicare Other | Source: Ambulatory Visit | Attending: Cardiology | Admitting: Cardiology

## 2015-02-14 DIAGNOSIS — I272 Other secondary pulmonary hypertension: Secondary | ICD-10-CM | POA: Diagnosis not present

## 2015-02-14 NOTE — Progress Notes (Signed)
Today, [name] exercised at [employer]. Service time was from 1030 to 1230.  The patient exercised by performing aerobic, strengthening, and stretching exercises. Oxygen saturation, heart rate, blood pressure, rate of perceived exertion, and shortness of breath were all monitored before, during, and after exercise. [name] presented with no problems at today's exercise session. Patient attended the [place] home care class today.  The patient did not have an increase in workload intensity during today's exercise session.  Pre-exercise vitals: . Weight kg: 84.3 . Liters of O2: ra . SpO2: 97 . HR: 64 . BP: 100/54 . CBG: 264  Exercise vitals: . Highest heartrate:  82 . Lowest oxygen saturation: 89 . Highest blood pressure: 142/70 . Liters of 02: ra  Post-exercise vitals: . SpO2: 97 . HR: 64 . BP: 98/62 . Liters of O2: ra . CBG:  Dr. Rush Farmer, Medical Director Dr. Waldron Labs is immediately available during today's Pulmonary Rehab session for [name] on 02/14/2015  at 1030 class time.  [name]

## 2015-02-14 NOTE — Progress Notes (Addendum)
I have reviewed a Home Exercise Prescription with [name] . [name] is walking currently exercising at home.  The patient was advised to walk 2-3 days a week for 30-45 minutes.  [name] and I discussed how to progress their exercise prescription.  The patient stated that their goals were build stamina and muscle tone.  The patient stated that they understand the exercise prescription.  We reviewed exercise guidelines, target heart rate during exercise, oxygen use, weather, home pulse oximeter, endpoints for exercise, and goals.  Patient is encouraged to come to me with any questions. I will continue to follow up with the patient to assist them with progression and safety.   Alberteen Sam, [place], ACSM RCEP [phone]

## 2015-02-15 ENCOUNTER — Other Ambulatory Visit: Payer: Self-pay

## 2015-02-15 DIAGNOSIS — E1110 Type 2 diabetes mellitus with ketoacidosis without coma: Secondary | ICD-10-CM

## 2015-02-15 NOTE — Patient Outreach (Signed)
[name]) Care Management  02/15/2015  [name] Aug 18, 1940 [id number]   Telephonic Monthly Assessment  (Patient prefers to be contacted via cell # [phone]).  Best time to be reached is morning.  Providers: Primary MD: Dr. Unk [name] - last appt 02/08/2015 CHF Clinic: Dr. Shaune Pascal [name] - last appt 01/10/15 - next appt 3 months (04/2015) or as needed. Cardiologist: Dr. Cathie [name]: Last appt 11/28/14.  Pulmonologist: Cory Doom, MD - last appt 10/15/2014 Opthamologist: Dr. Zigmund [name]: Next appt 12/18/14 OP Cardiac Rehab:  active Insurance: UHC/AARP Medicare Complete  Social:  Patient lives in his home with his wife of 73 years.  Mobility: Ambulates with oxygen to manage shortness of breath but requires no other assistance. Activity level improving with OP Cardiac Rehab.  Patient is walking, riding bike and using bands.  Falls: None Caregiver: wife/[name]. Advanced Directives: None  DME: BP cuff, scales, Oxygen ([facility]), CPap ([facility]).   CHF  Patient under CHF Clinic: Dr. Clayborne [name] services.  Patient is avoiding salt, caffeine and reports improved compliance with self management interventions.  Patient has clear understanding of red/yellow/green zones.  Wife does not know weight today but h/o Weight: 186 on recent appt with Dr. Melford [name] on 02/08/15 and 181 lbs on last heart failure clinic appt 01/10/15 (Weight up 5 lbs > 1 month.) RN CM discussed noting in MR that A1C is 8.5.  Wife states MD and rehab are both working with patient on his elevated A1c.   Plan: Screening 11/01/14 Initial Assessment 12/11/15 Telephonic RN CM services 11/01/14 - 02/15/15 Program: CHF 11/01/14 - 02/15/15  CHF RN CM advised to weigh daily and BP readings and log; take log to every MD appt. Patient has a [facility] calendar.  RN CM reviewed YELLOW and RED HF Zones; advised to report any symptoms which present and notify Dr.  Haroldine [name]. RN CM advised to report weight changes to heart failure clinic.    Educational Materials mailed 12/11/2014 (reviewed 01/18/2015) -Heart Failure Green, Yellow, Red Chart -Know Before You Go -Low/High Salt Foods chart -[facility] Care Management Health / [place] Referral  -A1C 8.5.   H/o patient just completed CHF Program with Telephonic RN CM; goals of care met.  Patient remains active with OP Cardiac Rehab.  Patient receiving education on getting A1C down at MD office and Cardiac Rehab.  RN CM encouraged patient would benefit further with Health Coach and encouraged participation.   Please contact patient for screening and patient will confirm his decisions to participate on that contact call.  Patient has made improvements in his CHF management and health outcomes over the past several months.  (Best time to reach patient is morning).    RN CM advised to please notify MD of any changes in condition prior to scheduled appt's.  RN CM provided contact name and # [phone] or main office # [phone] and 24-hour nurse line # [phone].  RN CM confirmed patient is aware of 911 services for urgent emergency needs. (especially difficulty breathing)  Mariann Laster, RN, BSN, [facility], [place] Management Care Management Coordinator [phone] Direct [phone] Cell [phone] Office [phone] Fax

## 2015-02-17 ENCOUNTER — Other Ambulatory Visit: Payer: Self-pay | Admitting: Cardiovascular Disease

## 2015-02-18 ENCOUNTER — Other Ambulatory Visit: Payer: Self-pay | Admitting: *Deleted

## 2015-02-18 MED ORDER — ACCU-CHEK AVIVA PLUS W/DEVICE KIT: PACK | Status: DC

## 2015-02-18 NOTE — Telephone Encounter (Signed)
PT  SEEN DR  [name]  IN JAN 2017   NOT SURE   WHY MED  WAS NOT  REFILLED. REFILL DONE  AS  REQUESTED./CY

## 2015-02-18 NOTE — Telephone Encounter (Signed)
Please review for refill. Thanks!  

## 2015-02-19 ENCOUNTER — Encounter (HOSPITAL_COMMUNITY)
Admission: RE | Admit: 2015-02-19 | Discharge: 2015-02-19 | Disposition: A | Discharge status: Home / Self Care | Payer: Medicare Other | Source: Ambulatory Visit | Attending: Cardiology | Admitting: Cardiology

## 2015-02-19 DIAGNOSIS — I272 Other secondary pulmonary hypertension: Secondary | ICD-10-CM | POA: Diagnosis not present

## 2015-02-19 NOTE — Progress Notes (Signed)
Today, Arhaan exercised at [employer]. Service time was from 1030 to 1215.  The patient exercised by performing aerobic, strengthening, and stretching exercises. Oxygen saturation, heart rate, blood pressure, rate of perceived exertion, and shortness of breath were all monitored before, during, and after exercise. Jaxston presented with no problems at today's exercise session.  The patient did have an increase in workload intensity during today's exercise session, but did not tolerated due to knee pain. Workload decreased back to L3 on NuStep  Pre-exercise vitals: . Weight kg: 84.2 . Liters of O2: ra . SpO2: 93 . HR: 75 . BP: 122/64 . CBG: 273  Exercise vitals: . Highest heartrate:  90 . Lowest oxygen saturation: 93 . Highest blood pressure: 100/58 . Liters of 02: ra  Post-exercise vitals: . SpO2: 95 . HR: 85 . BP: 80/52, increased to 98/56 with water . Liters of O2: ra . CBG: 366, Patient asymptomatic. Primary MD aware of sugars running 300s-500s. Patient stopping by office for further orders on hyperglycemic management.  Dr. Rush Farmer, Medical Director Dr. Marily Memos is immediately available during today's Pulmonary Rehab session for [name] on 02/19/2015 at 1030 class time

## 2015-02-20 ENCOUNTER — Ambulatory Visit (INDEPENDENT_AMBULATORY_CARE_PROVIDER_SITE_OTHER): Payer: Medicare Other | Admitting: Internal Medicine

## 2015-02-20 ENCOUNTER — Encounter: Payer: Self-pay | Admitting: Internal Medicine

## 2015-02-20 VITALS — BP 116/64 | HR 76 | Temp 97.3°F | Resp 16 | Ht 71.0 in | Wt 187.4 lb

## 2015-02-20 DIAGNOSIS — N183 Chronic kidney disease, stage 3 (moderate): Secondary | ICD-10-CM | POA: Diagnosis not present

## 2015-02-20 DIAGNOSIS — Z79899 Other long term (current) drug therapy: Secondary | ICD-10-CM

## 2015-02-20 DIAGNOSIS — E1122 Type 2 diabetes mellitus with diabetic chronic kidney disease: Secondary | ICD-10-CM

## 2015-02-20 DIAGNOSIS — I10 Essential (primary) hypertension: Secondary | ICD-10-CM | POA: Diagnosis not present

## 2015-02-20 NOTE — Progress Notes (Signed)
Subjective:    Patient ID: [name], male    DOB: December 10, 1940, 75 y.o.   MRN: [id number]  HPI  Thuis very nice 75 yo MWM with HTN, ASHD, CKD3 (GFR 50 ml/min) , COPD & T2_DM was recently note in Pulm rehab to have some elevated glucoses in the 3-400+ raange and he was advised to increase his low dose Glimepiride supplementing his Metformin base. He relates FBG 285 yesterday at [facility] and was 175 this am and stat check now in the office is 214 mg%. He denies any diabetic polys or visual blurring.   Medication Sig  . ALPRAZolam (XANAX) 1 MG tablet Take 0.5 mg by mouth at bedtime as needed for sleep. Reported on 01/10/2015  . atorvastatin (LIPITOR) 40 MG tablet TAKE 1 TABLET BY MOUTH EVERY DAY  . B Complex Vitamins (VITAMIN B COMPLEX PO) Take 1 tablet by mouth daily.  [name] Besifloxacin HCl 0.6 % SUSP Place 1 drop into the left eye See admin instructions. Use 1.5 days a month starting the day after get injection into the eye  . Blood Glucose Monitoring Suppl (ACCU-CHEK AVIVA PLUS) w/Device KIT Check blood sugar 1 time daily-DX-E11.29  . carvedilol (COREG) 6.25 MG tablet TAKE 1 TABLET (6.25 MG TOTAL) BY MOUTH 2 (TWO) TIMES DAILY.  . cholecalciferol (VITAMIN D) 1000 UNITS tablet Take 4,000-5,000 Units by mouth See admin instructions. Take 4 tablets four times a week and take 5 tablets three times a week  . diphenhydrAMINE (BENADRYL) 25 MG tablet Take 25 mg by mouth at bedtime as needed for allergies.   [name] glimepiride (AMARYL) 4 MG tablet Take 1/2 to 1 tablet 2 x day for Diabetes  . glucose blood (ACCU-CHEK AVIVA PLUS) test strip Check blood glucose 3 xdaily.   DX-E11.29 - fluctuating glucoses - uncontrolled Diabetes  . HYDROcodone-acetaminophen (NORCO/VICODIN) 5-325 MG per tablet Take 1 tablet by mouth every 6 (six) hours as needed for moderate pain.  . metFORMIN (GLUCOPHAGE-XR) 500 MG 24 hr tablet Take 2 tablets (1,000 mg total) by mouth 2 (two) times daily.  . metolazone (ZAROXOLYN) 5 MG tablet Take 5 mg  by mouth daily as needed (weight gain and edema).  . Multiple Vitamin (MULTIVITAMIN WITH MINERALS) TABS tablet Take 1 tablet by mouth daily.  . OXYGEN Inhale 2 L into the lungs as needed.   . potassium chloride SA (K-DUR,KLOR-CON) 20 MEQ tablet Take 2 tablets (40 mEq total) by mouth daily. (Patient taking differently: Take 20 mEq by mouth 2 (two) times daily. )  . ranitidine (ZANTAC) 150 MG tablet Take 150 mg by mouth daily as needed for heartburn.   . sertraline (ZOLOFT) 100 MG tablet TAKE 1 TABLET BY MOUTH DAILY  . spironolactone (ALDACTONE) 25 MG tablet Take 1 tablet (25 mg total) by mouth daily.  [name] torsemide (DEMADEX) 100 MG tablet Take 100 mg in AM and 50 mg in PM if needed for weight gain  . valsartan (DIOVAN) 320 MG tablet TAKE 1 TABLET (320 MG TOTAL) BY MOUTH DAILY.  [name] warfarin (COUMADIN) 5 MG tablet TAKE 1 TABLET BY MOUTH TWICE A DAY (Patient taking differently: 86m Monday, Wednesday, Friday, 10.535mSunday, Tuesday, Thurs, Sat)   Allergies  Allergen Reactions  . Sunflower Seed [Sunflower Oil] Swelling and Other (See Comments)    Tongue and lip swelling  . Horse-Derived Products Other (See Comments)    Per allergy skin test  . Tetanus Toxoids Other (See Comments)    Per allergy skin test   Past Medical  History  Diagnosis Date  . Hx of adenomatous colonic polyps   . Diabetes mellitus type II   . Hypertension   . Diverticulosis 2001  . Hyperlipidemia   . Persistent atrial fibrillation ([place])     a. s/p MAZE 04/2013 in setting of CABG. b. Amio stopped in 10/2013 after flutter ablation.  . Obstructive sleep apnea     compliant with CPAP  . Chronic diastolic congestive heart failure ([place])   . Hypertensive cardiomyopathy ([place])   . History of cardioversion     x3 (years uncertain)  . H/O hiatal hernia   . Basal cell carcinoma   . Adrenal adenoma   . CAD (coronary artery disease)     a. 04/2013 CABG x 2: LIMA to LAD, SVG to RI, EVH via R thigh.  . S/P Maze operation for atrial  fibrillation     a. 04/2013: Complete bilateral atrial lesion set using cryothermy and bipolar radiofrequency ablation with clipping of LA appendage (@ time of CABG)  . GERD (gastroesophageal reflux disease)   . Depression   . Anxiety   . DJD (degenerative joint disease)   . Atypical atrial flutter ([place]) 8/15, 10/15    a. DCCV 08/2013. b. s/p RFA 10/2013.  [name] Respiratory failure ([place])     a. Hypoxia 10/2013 - required supp O2 as inpatient, did not require it at discharge.  . Pleural effusion, left     a. s/p thoracentesis 05/2013.  [name] PFO (patent foramen ovale)     a. Small PFO by TEE 10/2013.  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 S/P Maze operation for atrial fibrillation 04/05/2013    Complete bilateral atrial lesion set using cryothermy and bipolar radiofrequency ablation with clipping of LA appendage via median sternotomy approach    Past Surgical History  Procedure Laterality Date  . Polinydal cyst      Removed  . Great toe arthrodesis, interphalangeal joint      Right foot  . Retina repair-right    . Cataract extraction      bilateral  . Basal cell carcinoma excision      x3 on face  . Polypectomy    . Cardiac catheterization      myocardial bridge but no cad  . Coronary artery bypass graft N/A 04/05/2013    Procedure: CORONARY ARTERY BYPASS GRAFTING (CABG) TIMES TWO USING LEFT INTERNAL MAMMARY ARTERY AND RIGHT SAPHENOUS LEG VEIN HARVESTED ENDOSCOPICALLY;  Surgeon: Rexene Alberts, MD;  Location: [place];  Service: Open Heart Surgery;  Laterality: N/A;  . Maze N/A 04/05/2013    Procedure: MAZE;  Surgeon: Rexene Alberts, MD;  Location: [place];  Service: Open Heart Surgery;  Laterality: N/A;  . Intraoperative transesophageal echocardiogram N/A 04/05/2013    Procedure: INTRAOPERATIVE TRANSESOPHAGEAL ECHOCARDIOGRAM;  Surgeon: Rexene Alberts, MD;  Location: [place];  Service: Open  Heart Surgery;  Laterality: N/A;  . Tee without cardioversion N/A 08/23/2013    Procedure: TRANSESOPHAGEAL ECHOCARDIOGRAM (TEE);  Surgeon: Sanda Klein, MD;  Location: [place];  Service: Cardiovascular;  Laterality: N/A;  . Cardioversion N/A 08/23/2013    Procedure: CARDIOVERSION;  Surgeon: Sanda Klein, MD;  Location: [facility];  Service: Cardiovascular;  Laterality: N/A;  . Tee without cardioversion N/A 10/26/2013    Procedure: TRANSESOPHAGEAL ECHOCARDIOGRAM (TEE);  Surgeon: Sueanne Margarita, MD;  Location: [place];  Service: Cardiovascular;  Laterality: N/A;  .  Left heart catheterization with coronary angiogram N/A 03/07/2013    Procedure: LEFT HEART CATHETERIZATION WITH CORONARY ANGIOGRAM;  Surgeon: Burnell Blanks, MD;  Location: [facility];  Service: Cardiovascular;  Laterality: N/A;  . Atrial fibrillation ablation N/A 10/26/2013    Procedure: ATRIAL FIBRILLATION ABLATION;  Surgeon: Coralyn Mark, MD;  Location: [place] [facility];  Service: Cardiovascular;  Laterality: N/A;  . Carpometacarpel suspension plasty Left 02/14/2014    Procedure: CARPOMETACARPEL ([place]) SUSPENSIONPLASTY THUMB  WITH  ABDUCTOR POLLICIS LONGUS TRANSFER AND STENOSING TENOSYNOVITIS RELEASE LEFT WRIST;  Surgeon: Charlotte Crumb, MD;  Location: [place];  Service: Orthopedics;  Laterality: Left;  . Trapezium resection    . Right heart catheterization N/A 05/03/2014    Procedure: RIGHT HEART CATH;  Surgeon: Jolaine Artist, MD;  Location: [facility];  Service: Cardiovascular;  Laterality: N/A;  . Tee without cardioversion N/A 06/05/2014    Procedure: TRANSESOPHAGEAL ECHOCARDIOGRAM (TEE);  Surgeon: Thayer Headings, MD;  Location: [facility];  Service: Cardiovascular;  Laterality: N/A;   Review of Systems  10 point systems review negative except as above.    Objective:   Physical Exam  BP 116/64 mmHg  Pulse 76  Temp(Src) 97.3 F (36.3 C)  Resp 16  Ht 5' 11"  (1.803 m)  Wt 187 lb 6.4  oz (85.004 kg)  BMI 26.15 kg/m2 O2 sat = 97% at rest  HEENT - Eac's patent. TM's Nl. EOM's full. PERRLA. NasoOroPharynx clear. Neck - supple. Nl Thyroid. Carotids 2+ & No bruits, nodes, JVD Chest - Clear equal BS w/o Rales, rhonchi, wheezes. Cor - Nl HS. RRR w/o sig MGR. PP 1(+). No edema. Abd - No palpable organomegaly, masses or tenderness. BS nl. MS- FROM w/o deformities. Muscle power, tone and bulk Nl. Gait Nl. Neuro - No obvious Cr N abnormalities. Sensory, motor and Cerebellar functions appear Nl w/o focal abnormalities. Psyche - Mental status normal & appropriate.    Assessment & Plan:   1. Essential hypertension  2. Controlled type 2 diabetes mellitus with stage 3 chronic kidney disease, without long-term current use of insulin (HCC)  - recc increase Glimepiride to 4 mg bid ( up to 8 mg bid) if glucoses greater 200 with hypoglycemic precautions.   3. Medication management  - BASIC METABOLIC PANEL WITH GFR

## 2015-02-21 ENCOUNTER — Other Ambulatory Visit: Payer: Self-pay | Admitting: *Deleted

## 2015-02-21 ENCOUNTER — Encounter (HOSPITAL_COMMUNITY)
Admission: RE | Admit: 2015-02-21 | Discharge: 2015-02-21 | Disposition: A | Discharge status: Home / Self Care | Payer: Medicare Other | Source: Ambulatory Visit | Attending: Cardiology | Admitting: Cardiology

## 2015-02-21 DIAGNOSIS — I272 Other secondary pulmonary hypertension: Secondary | ICD-10-CM | POA: Diagnosis not present

## 2015-02-21 LAB — BASIC METABOLIC PANEL WITH GFR
BUN: 40 mg/dL — ABNORMAL HIGH (ref 7–25)
CALCIUM: 9.1 mg/dL (ref 8.6–10.3)
CHLORIDE: 95 mmol/L — AB (ref 98–110)
CO2: 31 mmol/L (ref 20–31)
Creat: 1.61 mg/dL — ABNORMAL HIGH (ref 0.70–1.18)
GFR, EST NON AFRICAN AMERICAN: 41 mL/min — AB (ref >=60)
GFR, Est African American: 48 mL/min — ABNORMAL LOW (ref >=60)
GLUCOSE: 213 mg/dL — AB (ref 65–99)
POTASSIUM: 3.4 mmol/L — AB (ref 3.5–5.3)
SODIUM: 138 mmol/L (ref 135–146)

## 2015-02-21 NOTE — Patient Outreach (Signed)
[facility]) Care Management  02/21/2015  FILEMON GIMLIN 11/05/1940 [id number]   RN Health Coach attempted #1 introduction outreach call to patient.  Patient was unavailable. HIPPA compliance voicemail message was left with return callback number.    [place] Care Management [phone]

## 2015-02-21 NOTE — Progress Notes (Signed)
Today, [name] exercised at [employer]. Service time was from 1030 to 1200.  The patient exercised by performing aerobic, strengthening, and stretching exercises. Oxygen saturation, heart rate, blood pressure, rate of perceived exertion, and shortness of breath were all monitored before, during, and after exercise. [name] presented with no problems at today's exercise session. He attended warning signs and symptoms class today.  The patient did not have an increase in workload intensity during today's exercise session.  Pre-exercise vitals: . Weight kg: 83.3 . Liters of O2: RA . SpO2: 96 . HR: 66 . BP: 100/42 . CBG: 287  Exercise vitals: . Highest heartrate:  82 . Lowest oxygen saturation: 95 . Highest blood pressure: 114/82 . Liters of 02: RA  Post-exercise vitals: . SpO2: 95 . HR: 60 . BP: 102/68 . Liters of O2: RA . CBG: 320 Dr. Rush Farmer, Medical Director Dr. Eliseo Squires is immediately available during today's Pulmonary Rehab session for [name] on 02/21/2015  at 1030 class time  .

## 2015-02-26 ENCOUNTER — Encounter (HOSPITAL_COMMUNITY)
Admission: RE | Admit: 2015-02-26 | Discharge: 2015-02-26 | Disposition: A | Discharge status: Home / Self Care | Payer: Medicare Other | Source: Ambulatory Visit | Attending: Cardiology | Admitting: Cardiology

## 2015-02-26 DIAGNOSIS — I272 Other secondary pulmonary hypertension: Secondary | ICD-10-CM | POA: Diagnosis not present

## 2015-02-26 NOTE — Progress Notes (Signed)
Today, [name] exercised at [employer]. Service time was from 1030 to 1200.  The patient exercised by performing aerobic, strengthening, and stretching exercises. Oxygen saturation, heart rate, blood pressure, rate of perceived exertion, and shortness of breath were all monitored before, during, and after exercise. [name] presented with no problems at today's exercise session.  The patient did not have an increase in workload intensity during today's exercise session.  Pre-exercise vitals: . Weight kg: 85.6 . Liters of O2: RA . SpO2: 95 . HR: 75 . BP: 124/62 . CBG: 208  Exercise vitals: . Highest heartrate:  90 . Lowest oxygen saturation: 93 . Highest blood pressure: 130/78 . Liters of 02: RA  Post-exercise vitals: . SpO2: 96 . HR: 77 . BP: 92/44 drank water rechecked BP 124/58 . Liters of O2: RA . CBG: 282 Dr. Rush Farmer, Medical Director Dr. Marily Memos is immediately available during today's Pulmonary Rehab session for [name] on 02/26/2015  at 1030 class time  .

## 2015-02-28 ENCOUNTER — Encounter (HOSPITAL_COMMUNITY)
Admission: RE | Admit: 2015-02-28 | Discharge: 2015-02-28 | Disposition: A | Discharge status: Home / Self Care | Payer: Medicare Other | Source: Ambulatory Visit | Attending: Cardiology | Admitting: Cardiology

## 2015-02-28 ENCOUNTER — Other Ambulatory Visit: Payer: Self-pay | Admitting: *Deleted

## 2015-02-28 DIAGNOSIS — I272 Other secondary pulmonary hypertension: Secondary | ICD-10-CM | POA: Diagnosis not present

## 2015-02-28 NOTE — Patient Outreach (Signed)
[facility]) Care Management  02/28/2015  [name] 1940-05-07 [id number]  RN Health Coach  Attempted #2 intial Telephone introductory call to patient.  Patient was unavailable. HIPPA compliance voicemail message was left with return callback number.    Johny Shock, BSN, RN [facility] Care Management RN Health Coach Phone: [facility] complies with applicable Federal civil rights laws and does not discriminate on the basis of race, color, national origin, age, disability, or sex. Espaol (Spanish)  [place] cumple con las leyes federales de derechos civiles aplicables y no discrimina por motivos de raza, color, nacionalidad, edad, discapacidad o sexo.    Ti?ng Vi?t ([place])  [place] tun th? lu?t dn quy?n hi?n hnh c?a Lin bang v khng phn bi?t ?i x? d?a trn ch?ng t?c, mu da, ngu?n g?c qu?c gia, ? tu?i, khuy?t t?t, ho?c gi?i tnh.    (Arabic)    [place]                      .

## 2015-02-28 NOTE — Progress Notes (Addendum)
Today, [name] exercised at [employer]. Service time was from 1030 to 1215.  The patient exercised by performing aerobic, strengthening, and stretching exercises. Oxygen saturation, heart rate, blood pressure, rate of perceived exertion, and shortness of breath were all monitored before, during, and after exercise. [name] presented with no problems at today's exercise session. He attended exercise  for the pulmonary patient today.  The patient did not have an increase in workload intensity during today's exercise session.  Pre-exercise vitals: . Weight kg: 84.2 . Liters of O2: RA . SpO2: 97 . HR: 74 . BP: 100/56 . CBG: 210  Exercise vitals: . Highest heartrate:  97 . Lowest oxygen saturation: 88 . Highest blood pressure: 110/66 . Liters of 02: RA  Post-exercise vitals: . SpO2: 97 . HR: 71 . BP: 112/72 . Liters of O2: RA . CBG: 280 Dr. Rush Farmer, Medical Director Dr. Trellis Moment is immediately available during today's Pulmonary Rehab session for [name] on 02/28/2015  at 1030 class time  .

## 2015-03-04 ENCOUNTER — Ambulatory Visit: Payer: Medicare Other | Admitting: Cardiovascular Disease

## 2015-03-05 ENCOUNTER — Encounter (HOSPITAL_COMMUNITY)
Admission: RE | Admit: 2015-03-05 | Discharge: 2015-03-05 | Disposition: A | Discharge status: Home / Self Care | Payer: Medicare Other | Source: Ambulatory Visit | Attending: Cardiology | Admitting: Cardiology

## 2015-03-05 DIAGNOSIS — I272 Other secondary pulmonary hypertension: Secondary | ICD-10-CM | POA: Diagnosis not present

## 2015-03-05 NOTE — Progress Notes (Signed)
Daily Session Note  Patient Details  Name: [name] MRN: [id number] Date of Birth: 11/27/1940 Referring Provider:  [name], MD  Encounter Date: 03/05/2015  Check In:     Session Check In - 03/05/15 1029    Check-In   Location [facility]   Staff Present Rosebud Poles, RN, Levie Heritage, MA, ACSM RCEP, Exercise Physiologist;Annedrea Rosezella [place], RN, MHA;Portia Rollene Rotunda, RN, BSN   Supervising physician immediately available to respond to emergencies [facility] immediately available   Physician(s) Dr. Marily Memos   Medication changes reported     No   Fall or balance concerns reported    No   Warm-up and Cool-down Performed as group-led instruction   Resistance Training Performed Yes   Pain Assessment   Currently in Pain? Yes   Pain Score 2    Pain Location Knee   Pain Orientation Right   Pain Descriptors / Indicators Aching;Constant   Pain Type Chronic pain   Pain Onset In the past 7 days      Capillary Blood Glucose: No results found for this or any previous visit (from the past 24 hour(s)).     POCT Glucose - 03/05/15 1245    POCT Blood Glucose   Pre-Exercise 205 mg/dL         Exercise Prescription Changes - 03/05/15 1200    Exercise Review   Progression Yes   Response to Exercise   Blood Pressure (Admit) 100/62 mmHg   Blood Pressure (Exercise) 114/62 mmHg   Blood Pressure (Exit) 100/60 mmHg   Heart Rate (Admit) 79 bpm   Heart Rate (Exercise) 84 bpm   Heart Rate (Exit) 84 bpm   Oxygen Saturation (Admit) 96 %   Oxygen Saturation (Exercise) 95 %   Oxygen Saturation (Exit) 84 %   Rating of Perceived Exertion (Exercise) 13   Perceived Dyspnea (Exercise) 1   Symptoms --  knee pain   Duration Progress to 45 minutes of aerobic exercise without signs/symptoms of physical distress   Intensity Other (comment)  40-80% THR   Progression   Progression Continue to progress workloads to maintain intensity without signs/symptoms of  physical distress.   Resistance Training   Training Prescription Yes   Bike   Level 1   Minutes 15   NuStep   Level 4   Minutes 15   METs 3.1   Track   Laps 9   Minutes 15     Goals Met:  Using PLB without cueing & demonstrates good technique Exercise tolerated well Strength training completed today  Goals Unmet:  Not Applicable  Comments:    Dr. Rush Farmer is Medical Director for Pulmonary Rehab at [facility].

## 2015-03-05 NOTE — Progress Notes (Addendum)
[name] 75 y.o. male Nutrition Note Spoke with pt. Pt is overweight. Pt noted with recent wt loss from Nov 2016 to 01/08/15. Pt feels wt loss due to s/p paracentesis. Pt wt now trending closer to pt's UBW range. Pt denies decreased appetite at this time. Making healthy food choices the majority of the time.  Pt's Rate Your Plate results reviewed with pt. Pt avoids most salty food; does not use canned/ convenience food ofent. Pt does not add salt to food. The role of sodium in lung disease reviewed with pt. Pt is diabetic. Last A1c significantly increased from 09/27/14 to 01/08/15. Last A1c indicates blood glucose poorly controlled.  Pt checks CBG's  2-3 times a day.  Fasting CBG's reportedly 205-287 mg/dL before breakfast. Pt ate eggs and bacon; little or no carbs typically consumed. Post-exercise CBG's have increased. Post-exercise CBG's have ranged from 280-366 mg/dL. Suspect CBG's increasing with exercise likely due to pt not having enough insulin to absorb blood sugar. 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 Pt is on Coumadin and follows a diet with consistent vitamin K intake. Pt expressed understanding of the information reviewed via feedback method.    Lab Results  Component Value Date   HGBA1C 8.5* 01/08/2015   Nutrition Diagnosis ? Food-and nutrition-related knowledge deficit related to lack of exposure to information as related to diagnosis of pulmonary disease ?  Nutrition Rx/Est. Daily Nutrition Needs for: ? wt maintenance 2250-2600 Kcal  105-130 gm protein   1500 mg or less sodium     250-325 gm CHO Nutrition Intervention ? Pt's individual nutrition plan and goals reviewed with pt. ? Benefits of adopting healthy eating habits discussed when pt's Rate Your Plate reviewed. ? Will contact pt's PCP re: ? If pt may benefit from insulin and discontinuing Glimepiride. ? Pt to attend the Nutrition and Lung Disease class ? Continual  client-centered nutrition education by RD, as part of interdisciplinary care. Goal(s) 1. Use pre-/post meal and/or exercise CBG's and A1c to determine whether adjustments in food/meal planning will be beneficial or if any meds need to be combined with nutrition therapy. Monitor and Evaluate progress toward nutrition goal with team.   Derek Mound, M.Ed, RD, LDN, CDE 03/05/2015 11:48 AM

## 2015-03-07 ENCOUNTER — Encounter (HOSPITAL_COMMUNITY)
Admission: RE | Admit: 2015-03-07 | Discharge: 2015-03-07 | Disposition: A | Discharge status: Home / Self Care | Payer: Medicare Other | Source: Ambulatory Visit | Attending: Cardiology | Admitting: Cardiology

## 2015-03-07 DIAGNOSIS — I272 Other secondary pulmonary hypertension: Secondary | ICD-10-CM | POA: Insufficient documentation

## 2015-03-07 NOTE — Progress Notes (Signed)
Daily Session Note  Patient Details  Name: [name] MRN: [id number] Date of Birth: 1940-10-28 Referring Provider:  [name], MD  Encounter Date: 03/07/2015  Check In:     Session Check In - 03/07/15 1244    Pain Assessment   Currently in Pain? Yes   Pain Score 2    Pain Location Knee   Pain Orientation Right   Pain Descriptors / Indicators Aching;Constant   Pain Type Chronic pain   Pain Onset Other (comment)  chronic post exercise      Capillary Blood Glucose: No results found for this or any previous visit (from the past 24 hour(s)).     POCT Glucose - 03/07/15 1239    POCT Blood Glucose   Pre-Exercise 266 mg/dL   Post-Exercise 254 mg/dL         Exercise Prescription Changes - 03/07/15 1200    Response to Exercise   Blood Pressure (Admit) 140/70 mmHg   Blood Pressure (Exercise) 130/80 mmHg   Blood Pressure (Exit) 100/60 mmHg   Heart Rate (Admit) 82 bpm   Heart Rate (Exercise) 89 bpm   Heart Rate (Exit) 70 bpm   Oxygen Saturation (Admit) 96 %   Oxygen Saturation (Exercise) 94 %   Oxygen Saturation (Exit) 94 %   Rating of Perceived Exertion (Exercise) 13   Perceived Dyspnea (Exercise) 2   Duration Progress to 45 minutes of aerobic exercise without signs/symptoms of physical distress   Progression   Progression Continue to progress workloads to maintain intensity without signs/symptoms of physical distress.   Resistance Training   Training Prescription Yes   Weight orange bands   Reps 10-12   Bike   Level 1   Minutes 15   NuStep   Level 4   Minutes 15   METs 2.4     Goals Met:  Independence with exercise equipment Improved SOB with ADL's Using PLB without cueing & demonstrates good technique Exercise tolerated well No report of cardiac concerns or symptoms Strength training completed today  Goals Unmet:  Not Applicable  Comments: Service time is from 1030 to 1230, see paper ITP for educations   Dr. Rush Farmer is Medical  Director for Pulmonary Rehab at [facility].

## 2015-03-08 ENCOUNTER — Other Ambulatory Visit: Payer: Self-pay | Admitting: *Deleted

## 2015-03-08 NOTE — Patient Outreach (Signed)
[facility]) Care Management  03/08/2015  BORUCH MASCORRO 12-Dec-1940 [id number]   RN Health Coach telephone call to patient.  [employer] compliance verified. RN Health Coach  introduced self to patient. Patient was very agreeable to follow up out reach calls. Patient was referred by Mariann Laster  Plan: [place] will do follow up call within a month for assessment. Patient agreed to follow up call Best time to call patient is before 11 am or after 3pm on phone number [phone].  Johny Shock, BSN, RN [facility] Care Management RN Health Coach Phone: [place] complies with applicable Federal civil rights laws and does not discriminate on the basis of race, color, national origin, age, disability, or sex. Espaol (Spanish)  [place] cumple con las leyes federales de derechos civiles aplicables y no discrimina por motivos de raza, color, nacionalidad, edad, discapacidad o sexo.    [place] ([place]  [place] tun th? lu?t dn quy?n hi?n hnh c?a Lin bang v khng phn bi?t ?i x? d?a trn ch?ng t?c, mu da, ngu?n g?c qu?c gia, ? tu?i, khuy?t t?t, ho?c gi?i tnh.    (Arabic)    [place]                      .

## 2015-03-11 ENCOUNTER — Ambulatory Visit (INDEPENDENT_AMBULATORY_CARE_PROVIDER_SITE_OTHER): Payer: Medicare Other | Admitting: Internal Medicine

## 2015-03-11 ENCOUNTER — Encounter: Payer: Self-pay | Admitting: Internal Medicine

## 2015-03-11 VITALS — BP 102/60 | HR 76 | Temp 98.2°F | Resp 16 | Ht 71.0 in | Wt 185.0 lb

## 2015-03-11 DIAGNOSIS — I480 Paroxysmal atrial fibrillation: Secondary | ICD-10-CM | POA: Diagnosis not present

## 2015-03-11 DIAGNOSIS — I2510 Atherosclerotic heart disease of native coronary artery without angina pectoris: Secondary | ICD-10-CM | POA: Diagnosis not present

## 2015-03-11 DIAGNOSIS — Z7901 Long term (current) use of anticoagulants: Secondary | ICD-10-CM

## 2015-03-11 DIAGNOSIS — I4891 Unspecified atrial fibrillation: Secondary | ICD-10-CM | POA: Diagnosis not present

## 2015-03-11 NOTE — Progress Notes (Signed)
Assessment and Plan:   1. PAF (paroxysmal atrial fibrillation) (HCC) -cont coumadin -dose adjust as necessary based on INR - Protime-INR  2. Chronic anticoagulation  - Protime-INR     HPI 75 y.o.male presents for 1 month follow up of coumadin for PAF. Patient reports that they have been doing well.  male is taking their medication.  They are not having difficulty with their medications.  They report no adverse reactions.  He has had no issues with bleeding.  No melena, hematochezia, nose bleeds.    Past Medical History  Diagnosis Date  . Hx of adenomatous colonic polyps   . Diabetes mellitus type II   . Hypertension   . Diverticulosis 2001  . Hyperlipidemia   . Persistent atrial fibrillation ([place])     a. s/p MAZE 04/2013 in setting of CABG. b. Amio stopped in 10/2013 after flutter ablation.  . Obstructive sleep apnea     compliant with CPAP  . Chronic diastolic congestive heart failure ([place])   . Hypertensive cardiomyopathy ([place])   . History of cardioversion     x3 (years uncertain)  . H/O hiatal hernia   . Basal cell carcinoma   . Adrenal adenoma   . CAD (coronary artery disease)     a. 04/2013 CABG x 2: LIMA to LAD, SVG to RI, EVH via R thigh.  . S/P Maze operation for atrial fibrillation     a. 04/2013: Complete bilateral atrial lesion set using cryothermy and bipolar radiofrequency ablation with clipping of LA appendage (@ time of CABG)  . GERD (gastroesophageal reflux disease)   . Depression   . Anxiety   . DJD (degenerative joint disease)   . Atypical atrial flutter ([place]) 8/15, 10/15    a. DCCV 08/2013. b. s/p RFA 10/2013.  [name] Respiratory failure ([place])     a. Hypoxia 10/2013 - required supp O2 as inpatient, did not require it at discharge.  . Pleural effusion, left     a. s/p thoracentesis 05/2013.  [name] PFO (patent foramen ovale)     a. Small PFO by TEE 10/2013.  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 S/P Maze operation for atrial fibrillation 04/05/2013    Complete bilateral atrial lesion set using cryothermy and bipolar radiofrequency ablation with clipping of LA appendage via median sternotomy approach      Allergies  Allergen Reactions  . Sunflower Seed [Sunflower Oil] Swelling and Other (See Comments)    Tongue and lip swelling  . Horse-Derived Products Other (See Comments)    Per allergy skin test  . Tetanus Toxoids Other (See Comments)    Per allergy skin test      Current Outpatient Prescriptions on File Prior to Visit  Medication Sig Dispense Refill  . ALPRAZolam (XANAX) 1 MG tablet Take 0.5 mg by mouth at bedtime as needed for sleep. Reported on 01/10/2015    . atorvastatin (LIPITOR) 40 MG tablet TAKE 1 TABLET BY MOUTH EVERY DAY 30 tablet 1  . B Complex Vitamins (VITAMIN B COMPLEX PO) Take 1 tablet by mouth daily.    [name] Besifloxacin HCl 0.6 % SUSP Place 1 drop into the left eye See admin instructions. Use 1.5 days a month starting the day after get injection into the eye    . Blood Glucose Monitoring Suppl (ACCU-CHEK AVIVA PLUS) w/Device KIT Check blood sugar 1 time daily-DX-E11.29 1 kit 0  .  carvedilol (COREG) 6.25 MG tablet TAKE 1 TABLET (6.25 MG TOTAL) BY MOUTH 2 (TWO) TIMES DAILY. 60 tablet 6  . cholecalciferol (VITAMIN D) 1000 UNITS tablet Take 4,000-5,000 Units by mouth See admin instructions. Take 4 tablets four times a week and take 5 tablets three times a week    . diphenhydrAMINE (BENADRYL) 25 MG tablet Take 25 mg by mouth at bedtime as needed for allergies.     [name] glimepiride (AMARYL) 4 MG tablet Take 1/2 to 1 tablet 2 x day for Diabetes 180 tablet 99  . glucose blood (ACCU-CHEK AVIVA PLUS) test strip Check blood glucose 3 xdaily.   DX-E11.29 - fluctuating glucoses - uncontrolled Diabetes 300 each 12  . HYDROcodone-acetaminophen (NORCO/VICODIN) 5-325 MG per tablet Take 1 tablet by mouth every 6 (six) hours as needed for  moderate pain. 30 tablet   . metFORMIN (GLUCOPHAGE-XR) 500 MG 24 hr tablet Take 2 tablets (1,000 mg total) by mouth 2 (two) times daily. 360 tablet 4  . metolazone (ZAROXOLYN) 5 MG tablet Take 5 mg by mouth daily as needed (weight gain and edema).    . Multiple Vitamin (MULTIVITAMIN WITH MINERALS) TABS tablet Take 1 tablet by mouth daily.    . OXYGEN Inhale 2 L into the lungs as needed.     . potassium chloride SA (K-DUR,KLOR-CON) 20 MEQ tablet Take 2 tablets (40 mEq total) by mouth daily. (Patient taking differently: Take 20 mEq by mouth 2 (two) times daily. ) 90 tablet 3  . ranitidine (ZANTAC) 150 MG tablet Take 150 mg by mouth daily as needed for heartburn.     . sertraline (ZOLOFT) 100 MG tablet TAKE 1 TABLET BY MOUTH DAILY 30 tablet 11  . torsemide (DEMADEX) 100 MG tablet Take 100 mg in AM and 50 mg in PM if needed for weight gain    . valsartan (DIOVAN) 320 MG tablet TAKE 1 TABLET (320 MG TOTAL) BY MOUTH DAILY. 90 tablet 0  . warfarin (COUMADIN) 5 MG tablet TAKE 1 TABLET BY MOUTH TWICE A DAY (Patient taking differently: 42m Monday, Wednesday, Friday, 10.573mSunday, Tuesday, Thurs, Sat) 180 tablet 3   No current facility-administered medications on file prior to visit.    ROS: all negative except above.   Physical Exam: Filed Weights   03/11/15 1532  Weight: 185 lb (83.915 kg)   BP 102/60 mmHg  Pulse 76  Temp(Src) 98.2 F (36.8 C) (Temporal)  Resp 16  Ht 5' 11"  (1.803 m)  Wt 185 lb (83.915 kg)  BMI 25.81 kg/m2 General Appearance: Well developed well nourished, non-toxic appearing in no apparent distress. Eyes: PERRLA, EOMs, conjunctiva w/ no swelling or erythema or discharge Sinuses: No Frontal/maxillary tenderness ENT/Mouth: Ear canals clear without swelling or erythema.  TM's normal bilaterally with no retractions, bulging, or loss of landmarks.   Neck: Supple, thyroid normal, no notable JVD  Respiratory: Respiratory effort normal, Clear breath sounds anteriorly and  posteriorly bilaterally without rales, rhonchi, wheezing or stridor. No retractions or accessory muscle usage. Cardio: RRR with no MRGs.   Abdomen: Soft, + BS.  Non tender, no guarding, rebound, hernias, masses.  Musculoskeletal: Full ROM, 5/5 strength, normal gait.  Skin: Warm, dry without rashes  Neuro: Awake and oriented X 3, Cranial nerves intact. Normal muscle tone, no cerebellar symptoms. Sensation intact.  Psych: normal affect, Insight and Judgment appropriate.     CoStarlyn SkeansPA-C 3:55 PM [facility]

## 2015-03-12 ENCOUNTER — Encounter (HOSPITAL_COMMUNITY)
Admission: RE | Admit: 2015-03-12 | Discharge: 2015-03-12 | Disposition: A | Discharge status: Home / Self Care | Payer: Medicare Other | Source: Ambulatory Visit | Attending: Cardiology | Admitting: Cardiology

## 2015-03-12 DIAGNOSIS — I272 Other secondary pulmonary hypertension: Secondary | ICD-10-CM | POA: Diagnosis not present

## 2015-03-12 LAB — PROTIME-INR
INR: 2.24 — ABNORMAL HIGH (ref <1.50)
PROTHROMBIN TIME: 25.1 s — AB (ref 11.6–15.2)

## 2015-03-12 NOTE — Progress Notes (Signed)
Daily Session Note  Patient Details  Name: [name] MRN: [id number] Date of Birth: 1940-07-11 Referring Provider:  [name], MD  Encounter Date: 03/12/2015  Check In:     Session Check In - 03/12/15 1036    Check-In   Location [facility]   Staff Present Rosebud Poles, RN, Levie Heritage, MA, ACSM RCEP, Exercise Physiologist;Zosia Lucchese Rollene Rotunda, RN, BSN   Supervising physician immediately available to respond to emergencies [facility] immediately available   Physician(s) Marily Memos   Medication changes reported     No   Fall or balance concerns reported    No   Warm-up and Cool-down Performed as group-led Location manager Performed Yes   VAD Patient? No   Pain Assessment   Currently in Pain? No/denies      Capillary Blood Glucose: Results for orders placed or performed in visit on 03/11/15 (from the past 24 hour(s))  Protime-INR     Status: Abnormal   Collection Time: 03/11/15  4:18 PM  Result Value Ref Range   Prothrombin Time 25.1 (H) 11.6 - 15.2 seconds   INR 2.24 (H) <1.50   Narrative   Performed at:  [address], [address]                [place], [place] [address] SOURCE: [facility]        Exercise Prescription Changes - 03/12/15 1201    Exercise Review   Progression Yes   Response to Exercise   Blood Pressure (Admit) 100/56 mmHg   Blood Pressure (Exercise) 98/56 mmHg   Blood Pressure (Exit) 82/47 mmHg  reck after hydration 94/59   Heart Rate (Admit) 85 bpm   Heart Rate (Exercise) 97 bpm   Heart Rate (Exit) 71 bpm   Oxygen Saturation (Admit) 96 %   Oxygen Saturation (Exercise) 92 %   Oxygen Saturation (Exit) 96 %   Rating of Perceived Exertion (Exercise) 12   Perceived Dyspnea (Exercise) 1   Symptoms hypotension at discharge   Comments given h2o   Duration Progress to 45 minutes of aerobic exercise without signs/symptoms of physical distress   Intensity THRR unchanged   Progression   Progression Continue to progress workloads to maintain intensity without signs/symptoms of physical distress.   Resistance Training   Training Prescription Yes   Weight orange bands   Reps 10-12   Bike   Level 1   Minutes 15   NuStep   Level 5   Minutes 15   METs 2.4   Track   Laps 12   Minutes 15     Goals Met:  Independence with exercise equipment Improved SOB with ADL's Using PLB without cueing & demonstrates good technique Exercise tolerated well No report of cardiac concerns or symptoms Strength training completed today  Goals Unmet:  BP, BP 87/47 at checkout, reck 94/59  Comments: Service time is from 1030 to 1200   Dr. Rush Farmer is Medical Director for Pulmonary Rehab at [facility].

## 2015-03-14 ENCOUNTER — Encounter (HOSPITAL_COMMUNITY)
Admission: RE | Admit: 2015-03-14 | Discharge: 2015-03-14 | Disposition: A | Discharge status: Home / Self Care | Payer: Medicare Other | Source: Ambulatory Visit | Attending: Cardiology | Admitting: Cardiology

## 2015-03-14 DIAGNOSIS — I272 Other secondary pulmonary hypertension: Secondary | ICD-10-CM | POA: Diagnosis not present

## 2015-03-14 NOTE — Progress Notes (Signed)
Daily Session Note  Patient Details  Name: [name] MRN: [id number] Date of Birth: Jun 17, 1940 Referring Provider:  [name], MD  Encounter Date: 03/14/2015  Check In:     Session Check In - 03/14/15 1107    Check-In   Location [facility]   Staff Present Rosebud Poles, RN, Levie Heritage, MA, ACSM RCEP, Exercise Physiologist;Portia Rollene Rotunda, RN, BSN   Supervising physician immediately available to respond to emergencies [facility] immediately available   Physician(s)  Dr. Waldron Labs   Medication changes reported     No   Fall or balance concerns reported    No   Warm-up and Cool-down Performed as group-led instruction   Resistance Training Performed Yes   VAD Patient? No   Pain Assessment   Currently in Pain? No/denies   Multiple Pain Sites No      Capillary Blood Glucose: No results found for this or any previous visit (from the past 24 hour(s)).     POCT Glucose - 03/14/15 1254    POCT Blood Glucose   Pre-Exercise 222 mg/dL   Post-Exercise 175 mg/dL         Exercise Prescription Changes - 03/14/15 1200    Exercise Review   Progression No   Response to Exercise   Blood Pressure (Admit) 104/60 mmHg   Blood Pressure (Exercise) 122/70 mmHg   Blood Pressure (Exit) 100/50 mmHg   Heart Rate (Admit) 63 bpm   Heart Rate (Exercise) 92 bpm   Heart Rate (Exit) 64 bpm   Oxygen Saturation (Admit) 97 %   Oxygen Saturation (Exercise) 93 %   Oxygen Saturation (Exit) 96 %   Rating of Perceived Exertion (Exercise) 11   Perceived Dyspnea (Exercise) 1   Duration Progress to 45 minutes of aerobic exercise without signs/symptoms of physical distress   Intensity THRR unchanged   Progression   Progression Continue to progress workloads to maintain intensity without signs/symptoms of physical distress.   Resistance Training   Training Prescription Yes   Weight orange bands   Reps 10-12   Interval Training   Interval Training No   NuStep   Level 5   Minutes 15   METs 2.9   Track   Laps 15   Minutes 15     Goals Met:  Proper associated with RPD/PD & O2 Sat Independence with exercise equipment Using PLB without cueing & demonstrates good technique Exercise tolerated well Strength training completed today  Goals Unmet:  Not Applicable  Comments: Service time is from 1030 to 1215 See paper documentation for education topic today.  Dr. Rush Farmer is Medical Director for Pulmonary Rehab at [facility].

## 2015-03-18 ENCOUNTER — Other Ambulatory Visit: Payer: Self-pay | Admitting: *Deleted

## 2015-03-18 ENCOUNTER — Ambulatory Visit: Payer: Self-pay | Admitting: *Deleted

## 2015-03-19 ENCOUNTER — Encounter (HOSPITAL_COMMUNITY)
Admission: RE | Admit: 2015-03-19 | Discharge: 2015-03-19 | Disposition: A | Discharge status: Home / Self Care | Payer: Medicare Other | Source: Ambulatory Visit | Attending: Cardiology | Admitting: Cardiology

## 2015-03-19 DIAGNOSIS — I272 Other secondary pulmonary hypertension: Secondary | ICD-10-CM | POA: Diagnosis not present

## 2015-03-19 NOTE — Progress Notes (Signed)
Daily Session Note  Patient Details  Name: [name] MRN: [id number] Date of Birth: 1940/11/13 Referring Provider:  [name], MD  Encounter Date: 03/19/2015  Check In:     Session Check In - 03/19/15 1058    Check-In   [facility]   Staff Present Rosebud Poles, RN, Levie Heritage, MA, ACSM RCEP, Exercise Physiologist;Portia Rollene Rotunda, RN, BSN;Ramon Dredge, RN, [facility]   Supervising physician immediately available to respond to emergencies [facility] immediately available   Physician(s) Dr. Marily Memos   Medication changes reported     No   Fall or balance concerns reported    No   Warm-up and Cool-down Performed as group-led instruction   Resistance Training Performed Yes   VAD Patient? No   Pain Assessment   Currently in Pain? No/denies      Capillary Blood Glucose: No results found for this or any previous visit (from the past 24 hour(s)).     POCT Glucose - 03/19/15 1246    POCT Blood Glucose   Pre-Exercise 290 mg/dL         Exercise Prescription Changes - 03/19/15 1200    Exercise Review   Progression No   Response to Exercise   Blood Pressure (Admit) 120/4 mmHg   Blood Pressure (Exercise) 132/76 mmHg   Blood Pressure (Exit) 118/62 mmHg   Heart Rate (Admit) 74 bpm   Heart Rate (Exercise) 87 bpm   Heart Rate (Exit) 64 bpm   Oxygen Saturation (Admit) 92 %   Oxygen Saturation (Exercise) 93 %   Oxygen Saturation (Exit) 96 %   Rating of Perceived Exertion (Exercise) 13   Perceived Dyspnea (Exercise) 1   Duration Progress to 45 minutes of aerobic exercise without signs/symptoms of physical distress   Intensity THRR unchanged   Progression   Progression Continue progressive overload as per policy without signs/symptoms or physical distress.   Resistance Training   Training Prescription Yes   Weight orange bands   Reps 10-12   Interval Training   Interval Training No   Bike   Level 1   Minutes 15   NuStep   Level  5   Minutes 15   METs 2.8   Track   Laps 17   Minutes 15     Goals Met:  Using PLB without cueing & demonstrates good technique Exercise tolerated well No report of cardiac concerns or symptoms Strength training completed today  Goals Unmet:  Not Applicable  Comments: Service time is from 1030 to 1230    Dr. Rush Farmer is Medical Director for Pulmonary Rehab at [facility]

## 2015-03-20 ENCOUNTER — Other Ambulatory Visit: Payer: Self-pay | Admitting: *Deleted

## 2015-03-20 DIAGNOSIS — Z1212 Encounter for screening for malignant neoplasm of rectum: Secondary | ICD-10-CM

## 2015-03-20 LAB — POC HEMOCCULT BLD/STL (HOME/3-CARD/SCREEN)
Card #3 Fecal Occult Blood, POC: NEGATIVE
FECAL OCCULT BLD: NEGATIVE
Fecal Occult Blood, POC: NEGATIVE

## 2015-03-21 ENCOUNTER — Encounter (HOSPITAL_COMMUNITY)
Admission: RE | Admit: 2015-03-21 | Discharge: 2015-03-21 | Disposition: A | Discharge status: Home / Self Care | Payer: Medicare Other | Source: Ambulatory Visit | Attending: Cardiology | Admitting: Cardiology

## 2015-03-21 DIAGNOSIS — I272 Other secondary pulmonary hypertension: Secondary | ICD-10-CM | POA: Diagnosis not present

## 2015-03-21 NOTE — Progress Notes (Signed)
Daily Session Note  Patient Details  Name: [name] MRN: [id number] Date of Birth: [birth year] Referring Provider:  [name], MD  Encounter Date: 03/21/2015  Check In:     Session Check In - 03/21/15 1059    Check-In   [facility]   Staff Present Rosebud Poles, RN, Fletcher Anon, M.Ed., RD, LDN, CDE, Clinical Nutritionist Lamar Sprinkles, MA, ACSM RCEP, Exercise Physiologist;Lisa Ysidro Evert, Felipe Drone, RN, MHA;Portia Rollene Rotunda, RN, BSN   Supervising physician immediately available to respond to emergencies [facility] immediately available   Physician(s) Dr. Jerilee Hoh   Medication changes reported     No   Fall or balance concerns reported    No   Warm-up and Cool-down Performed as group-led instruction   Resistance Training Performed Yes   VAD Patient? No   Pain Assessment   Currently in Pain? No/denies   Multiple Pain Sites No      Capillary Blood Glucose: Results for orders placed or performed in visit on 03/20/15 (from the past 24 hour(s))  POC Hemoccult Bld/Stl (3-Cd Home Screen)     Status: Normal   Collection Time: 03/20/15  3:20 PM  Result Value Ref Range   Card #1 Date     Fecal Occult Blood, POC Negative Negative   Card #2 Date     Card #2 Fecal Occult Blod, POC Negative    Card #3 Date     Card #3 Fecal Occult Blood, POC Negative         Exercise Prescription Changes - 03/21/15 1200    Exercise Review   Progression No   Response to Exercise   Blood Pressure (Admit) 90/50 mmHg   Blood Pressure (Exercise) 110/62 mmHg   Blood Pressure (Exit) 104/60 mmHg   Heart Rate (Admit) 76 bpm   Heart Rate (Exercise) 97 bpm   Heart Rate (Exit) 77 bpm   Oxygen Saturation (Admit) 94 %   Oxygen Saturation (Exercise) 91 %   Oxygen Saturation (Exit) 94 %   Rating of Perceived Exertion (Exercise) 12   Perceived Dyspnea (Exercise) 1   Duration Progress to 45 minutes of aerobic exercise without signs/symptoms of physical  distress   Intensity THRR unchanged   Progression   Progression Continue progressive overload as per policy without signs/symptoms or physical distress.   Resistance Training   Training Prescription Yes   Weight orange bands   Reps 10-12   Interval Training   Interval Training No   Bike   Level 1   Minutes 15   Track   Laps 19   Minutes 15     Goals Met:  Proper associated with RPD/PD & O2 Sat Independence with exercise equipment Exercise tolerated well Strength training completed today  Goals Unmet:  Not Applicable  Comments: Service time is from 1030 to 1240  See paper documentation for education topic today.  Dr. Rush Farmer is Medical Director for Pulmonary Rehab at [facility].

## 2015-03-22 ENCOUNTER — Encounter: Payer: Self-pay | Admitting: *Deleted

## 2015-03-22 NOTE — Patient Outreach (Signed)
[facility]) Care Management  [place]  03/22/2015   [name] 1940-08-30 [id number]  Subjective: RN Health Coach telephone call to patient.  Hipaa compliance verified.Per patient his A1C is 8.5.   He has a target range of 6.1. Per patient his hardest thing to overcome is the craving of carbohydrates. He is checking his blood sugar as per Dr order. Per patient he is taking his medications as per order.   Patient has not had any symptoms of hyperglycemia but he has had symptoms of hypoglycemia. He stated he feels weak, shaky and his heart rate goes up.Patient is also currently in pulmonary rehab. Patient is agreeable to follow up outreach calls and education.  Objective:   Current Medications:  Current Outpatient Prescriptions  Medication Sig Dispense Refill  . ALPRAZolam (XANAX) 1 MG tablet Take 0.5 mg by mouth at bedtime as needed for sleep. Reported on 01/10/2015    . atorvastatin (LIPITOR) 40 MG tablet TAKE 1 TABLET BY MOUTH EVERY DAY 30 tablet 1  . B Complex Vitamins (VITAMIN B COMPLEX PO) Take 1 tablet by mouth daily.    [name] Besifloxacin HCl 0.6 % SUSP Place 1 drop into the left eye See admin instructions. Use 1.5 days a month starting the day after get injection into the eye    . Blood Glucose Monitoring Suppl (ACCU-CHEK AVIVA PLUS) w/Device KIT Check blood sugar 1 time daily-DX-E11.29 1 kit 0  . carvedilol (COREG) 6.25 MG tablet TAKE 1 TABLET (6.25 MG TOTAL) BY MOUTH 2 (TWO) TIMES DAILY. 60 tablet 6  . cholecalciferol (VITAMIN D) 1000 UNITS tablet Take 4,000-5,000 Units by mouth See admin instructions. Take 4 tablets four times a week and take 5 tablets three times a week    . diphenhydrAMINE (BENADRYL) 25 MG tablet Take 25 mg by mouth at bedtime as needed for allergies.     . fluticasone (FLONASE) 50 MCG/ACT nasal spray Place into both nostrils daily.    [name] glimepiride (AMARYL) 4 MG tablet Take 1/2 to 1 tablet 2 x day for Diabetes 180 tablet 99  . glucose  blood (ACCU-CHEK AVIVA PLUS) test strip Check blood glucose 3 xdaily.   DX-E11.29 - fluctuating glucoses - uncontrolled Diabetes 300 each 12  . HYDROcodone-acetaminophen (NORCO/VICODIN) 5-325 MG per tablet Take 1 tablet by mouth every 6 (six) hours as needed for moderate pain. 30 tablet   . loratadine (CLARITIN) 10 MG tablet Take 10 mg by mouth daily.    . metFORMIN (GLUCOPHAGE-XR) 500 MG 24 hr tablet Take 2 tablets (1,000 mg total) by mouth 2 (two) times daily. 360 tablet 4  . metolazone (ZAROXOLYN) 5 MG tablet Take 5 mg by mouth daily as needed (weight gain and edema).    . Multiple Vitamin (MULTIVITAMIN WITH MINERALS) TABS tablet Take 1 tablet by mouth daily.    . OXYGEN Inhale 2 L into the lungs as needed.     . potassium chloride SA (K-DUR,KLOR-CON) 20 MEQ tablet Take 2 tablets (40 mEq total) by mouth daily. (Patient taking differently: Take 20 mEq by mouth 2 (two) times daily. ) 90 tablet 3  . ranitidine (ZANTAC) 150 MG tablet Take 150 mg by mouth daily as needed for heartburn.     . sertraline (ZOLOFT) 100 MG tablet TAKE 1 TABLET BY MOUTH DAILY 30 tablet 11  . torsemide (DEMADEX) 100 MG tablet Take 100 mg in AM and 50 mg in PM if needed for weight gain    . valsartan (DIOVAN)  320 MG tablet TAKE 1 TABLET (320 MG TOTAL) BY MOUTH DAILY. 90 tablet 0  . warfarin (COUMADIN) 5 MG tablet TAKE 1 TABLET BY MOUTH TWICE A DAY (Patient taking differently: 10mg Monday, Wednesday, Friday, 10.5mg Sunday, Tuesday, Thurs, Sat) 180 tablet 3   No current facility-administered medications for this visit.    Functional Status:  In your present state of health, do you have any difficulty performing the following activities: 03/18/2015 02/08/2015  Hearing? N N  Vision? N N  Difficulty concentrating or making decisions? N N  Walking or climbing stairs? Y N  Dressing or bathing? N N  Doing errands, shopping? N N  Preparing Food and eating ? N -  Using the Toilet? N -  In the past six months, have you accidently  leaked urine? N -  Do you have problems with loss of bowel control? N -  Managing your Medications? N -  Managing your Finances? N -  Housekeeping or managing your Housekeeping? N -    Fall/Depression Screening: PHQ 2/9 Scores 03/18/2015 02/08/2015 02/01/2015 01/18/2015 01/08/2015 12/11/2014 11/01/2014  PHQ - 2 Score 0 0 0 0 0 0 0   THN CM Care Plan Problem One        Most Recent Value   Care Plan Problem One  Knowlwdge deficit in self management of Diabetes   Role Documenting the Problem One  Health Coach   Care Plan for Problem One  Active   THN Long Term Goal (31-90 days)  Patient will show a decrease in A1C by one point in 90 days   THN Long Term Goal Start Date  03/18/15   Interventions for Problem One Long Term Goal  RN will send educational material on your goal of A1C, How the A1C helps , The A1C test.  Controlling your blood sugar, RN will follow up with a month for teachback and discussion   THN CM Short Term Goal #1 (0-30 days)  Patient will be able to verbalize healthy snacks within the 30 days   THN CM Short Term Goal #1 Start Date  03/18/15   Interventions for Short Term Goal #1  RN will give patient a list of healthy snacks. RN will send patient infomation on Healthy meal plan,    THN CM Short Term Goal #2 (0-30 days)  Patient will be able to verbalize hypo and hyperglycemia symptoms and action plan within the next 30 days   THN CM Short Term Goal #2 Start Date  03/18/15   Interventions for Short Term Goal #2  RN discussed with patient signs and symptoms of hypo and hyper glycemia, RN will send patient a picture chart along with written EMMI information . RN will follow up within a month for discussionm.   THN CM Short Term Goal #3 (0-30 days)  Patient will be able to verbalize blood sugar range under 200 within the next 30 days   THN CM Short Term Goal #3 Start Date  03/18/15   Interventions for Short Tern Goal #3  RN will send patiient infomation on controling your diabetes. RN  will discuss the range needed for the A1C to decrease to the patient desired level. RN will follow up with discussion      Assessment:  Patient would benefit from Health Coach telephonic outreach for education and support for diabetes self management.  Plan: RN sent educational picture chart on Hypo and Hyperglycemia RN sent EMMI educational written information on hyper and hypoglycemia RN sent a   Healthy snack list RN sent educational information on Creating a healthy meal plan RN sent EMMI information on Controlling your blood sugar RN sent education material on How the A1C helps RN will follow up within a month for discussion and teach back    Johny Shock, BSN, [facility] RN Health Coach Phone: [facility] complies with applicable Federal civil rights laws and does not discriminate on the basis of race, color, national origin, age, disability, or sex. Espaol (Spanish)  [facility] cumple con las leyes federales de derechos civiles aplicables y no discrimina por motivos de raza, color, nacionalidad, edad, discapacidad o sexo.    [place] ([place]  [place] tun th? lu?t dn quy?n hi?n hnh c?a Lin bang v khng phn bi?t ?i x? d?a trn ch?ng t?c, mu da, ngu?n g?c qu?c gia, ? tu?i, khuy?t t?t, ho?c gi?i tnh.    (Arabic)    [place]                      .

## 2015-03-26 ENCOUNTER — Encounter (HOSPITAL_COMMUNITY): Payer: Medicare Other

## 2015-03-28 ENCOUNTER — Other Ambulatory Visit: Payer: Self-pay | Admitting: Internal Medicine

## 2015-03-28 ENCOUNTER — Encounter (HOSPITAL_COMMUNITY)
Admission: RE | Admit: 2015-03-28 | Discharge: 2015-03-28 | Disposition: A | Discharge status: Home / Self Care | Payer: Medicare Other | Source: Ambulatory Visit | Attending: Cardiology | Admitting: Cardiology

## 2015-03-28 ENCOUNTER — Encounter: Payer: Self-pay | Admitting: Internal Medicine

## 2015-03-28 DIAGNOSIS — I272 Other secondary pulmonary hypertension: Secondary | ICD-10-CM | POA: Diagnosis not present

## 2015-03-28 NOTE — Progress Notes (Addendum)
Daily Session Note  Patient Details  Name: [name] MRN: [id number] Date of Birth: 12-07-1940 Referring Provider:  [name], MD  Encounter Date: 03/28/2015  Check In:     Session Check In - 03/28/15 1104    Check-In   Location [facility]   Staff Present Rosebud Poles, RN, Luisa Hart, RN, BSN;Ramon Dredge, RN, MHA;Jessica Luan Pulling, MA, ACSM RCEP, Exercise Physiologist;Maria Venetia Maxon, RN, BSN   Supervising physician immediately available to respond to emergencies [facility] immediately available   Physician(s) Dr. Waldron Labs   Medication changes reported     No   Fall or balance concerns reported    No   Warm-up and Cool-down Performed as group-led instruction   Resistance Training Performed Yes   VAD Patient? No   Pain Assessment   Currently in Pain? No/denies   Multiple Pain Sites No      Capillary Blood Glucose: No results found for this or any previous visit (from the past 24 hour(s)).     POCT Glucose - 03/28/15 1235    POCT Blood Glucose   Pre-Exercise 287 mg/dL   Post-Exercise 343 mg/dL         Exercise Prescription Changes - 03/28/15 1200    Exercise Review   Progression Yes   Response to Exercise   Blood Pressure (Admit) 100/60 mmHg   Blood Pressure (Exercise) 100/60 mmHg   Blood Pressure (Exit) 108/70 mmHg   Heart Rate (Admit) 59 bpm   Heart Rate (Exercise) 103 bpm   Heart Rate (Exit) 77 bpm   Oxygen Saturation (Admit) 97 %   Oxygen Saturation (Exercise) 88 %   Oxygen Saturation (Exit) 94 %   Rating of Perceived Exertion (Exercise) 11   Perceived Dyspnea (Exercise) 1   Duration Progress to 45 minutes of aerobic exercise without signs/symptoms of physical distress   Intensity THRR unchanged   Progression   Progression Continue progressive overload as per policy without signs/symptoms or physical distress.   Resistance Training   Training Prescription Yes   Weight orange bands   Reps 10-12   Interval  Training   Interval Training No   NuStep   Level 6   Minutes 15   METs 2.9   Track   Laps 17   Minutes 15     Goals Met:  Independence with exercise equipment Improved SOB with ADL's Using PLB without cueing & demonstrates good technique Exercise tolerated well No report of cardiac concerns or symptoms Strength training completed today  Goals Unmet:  Not Applicable  Comments: Service time is from 1030 to 1220. See paper ITP for education documentation.   Dr. Rush Farmer is Medical Director for Pulmonary Rehab at [facility].

## 2015-04-01 ENCOUNTER — Ambulatory Visit (INDEPENDENT_AMBULATORY_CARE_PROVIDER_SITE_OTHER): Payer: Medicare Other

## 2015-04-01 DIAGNOSIS — Z719 Counseling, unspecified: Secondary | ICD-10-CM | POA: Diagnosis not present

## 2015-04-02 ENCOUNTER — Encounter (HOSPITAL_COMMUNITY)
Admission: RE | Admit: 2015-04-02 | Discharge: 2015-04-02 | Disposition: A | Discharge status: Home / Self Care | Payer: Medicare Other | Source: Ambulatory Visit | Attending: Cardiology | Admitting: Cardiology

## 2015-04-02 ENCOUNTER — Other Ambulatory Visit: Payer: Self-pay | Admitting: Internal Medicine

## 2015-04-02 VITALS — Wt 191.1 lb

## 2015-04-02 DIAGNOSIS — I272 Other secondary pulmonary hypertension: Secondary | ICD-10-CM | POA: Diagnosis not present

## 2015-04-02 MED ORDER — NYSTATIN 100000 UNIT/ML MT SUSP: OROMUCOSAL | Status: DC

## 2015-04-02 NOTE — Progress Notes (Signed)
Daily Session Note  Patient Details  Name: [name] MRN: [id number] Date of Birth: Sep 25, 1940 Referring Provider:  [name], MD  Encounter Date: 04/02/2015  Check In:     Session Check In - 04/02/15 1212    Check-In   [facility]   Staff Present Rosebud Poles, RN, Luisa Hart, RN, BSN;Ramon Dredge, RN, MHA;Jessica Luan Pulling, MA, ACSM RCEP, Exercise Physiologist   Supervising physician immediately available to respond to emergencies [facility] immediately available   Physician(s) Dr. Marily Memos   Medication changes reported     No   Fall or balance concerns reported    No   Warm-up and Cool-down Performed as group-led instruction   Resistance Training Performed Yes   VAD Patient? No   Pain Assessment   Currently in Pain? No/denies   Multiple Pain Sites No      Capillary Blood Glucose: No results found for this or any previous visit (from the past 24 hour(s)).      Exercise Prescription Changes - 04/02/15 1200    Exercise Review   Progression No   Response to Exercise   Blood Pressure (Admit) 136/62 mmHg   Blood Pressure (Exercise) 120/60 mmHg   Blood Pressure (Exit) 118/74 mmHg   Heart Rate (Admit) 79 bpm   Heart Rate (Exercise) 104 bpm   Heart Rate (Exit) 83 bpm   Oxygen Saturation (Admit) 94 %   Oxygen Saturation (Exercise) 92 %   Oxygen Saturation (Exit) 95 %   Rating of Perceived Exertion (Exercise) 13   Perceived Dyspnea (Exercise) 1   Symptoms --  peri-orbital edema, weight up 1.7 kg, does not feel well, ap   Comments heart failure clinic appt made for today   Duration Progress to 45 minutes of aerobic exercise without signs/symptoms of physical distress   Intensity THRR unchanged   Progression   Progression Continue to progress workloads to maintain intensity without signs/symptoms of physical distress.   Resistance Training   Training Prescription Yes   Weight orange bands   Reps 10-12   Interval  Training   Interval Training No   Bike   Level 1   Minutes 15   NuStep   Level 6   Minutes 15   METs 2.7   Track   Laps 12   Minutes 15     Goals Met:  Proper associated with RPD/PD & O2 Sat Independence with exercise equipment Using PLB without cueing & demonstrates good technique Strength training completed today  Goals Unmet:  Not Applicable  Comments: Service time is from 1030 to 1200 Weight up 1.7 kg, exhibited peri-orbital edema, and does not feel well, appt made in heart failure clinic for today.   Dr. Rush Farmer is Medical Director for Pulmonary Rehab at [facility].

## 2015-04-03 ENCOUNTER — Encounter (INDEPENDENT_AMBULATORY_CARE_PROVIDER_SITE_OTHER): Payer: Medicare Other | Admitting: Ophthalmology

## 2015-04-04 ENCOUNTER — Encounter (HOSPITAL_COMMUNITY)
Admission: RE | Admit: 2015-04-04 | Discharge: 2015-04-04 | Disposition: A | Discharge status: Home / Self Care | Payer: Medicare Other | Source: Ambulatory Visit | Attending: Cardiology | Admitting: Cardiology

## 2015-04-04 VITALS — Wt 186.5 lb

## 2015-04-04 DIAGNOSIS — I272 Other secondary pulmonary hypertension: Secondary | ICD-10-CM | POA: Diagnosis not present

## 2015-04-04 NOTE — Progress Notes (Signed)
Daily Session Note  Patient Details  Name: [name] MRN: [id number] Date of Birth: 1940/06/14 Referring Provider:  [name], MD  Encounter Date: 04/04/2015  Check In:     Session Check In - 04/04/15 1558    Check-In   [facility]   Staff Present Su Hilt, MS, ACSM RCEP, Exercise Physiologist;Joan Leonia Reeves, RN, Luisa Hart, RN, Roque Cash, RN   Supervising physician immediately available to respond to emergencies [facility] immediately available   Physician(s) Dr. Marily Memos   Medication changes reported     No   Fall or balance concerns reported    No   Warm-up and Cool-down Performed as group-led instruction   Resistance Training Performed No   VAD Patient? No   Pain Assessment   Currently in Pain? No/denies   Multiple Pain Sites No      Capillary Blood Glucose: No results found for this or any previous visit (from the past 24 hour(s)).     POCT Glucose - 04/04/15 1606    POCT Blood Glucose   Pre-Exercise 202 mg/dL   Post-Exercise 253 mg/dL         Exercise Prescription Changes - 04/04/15 1230    Exercise Review   Progression No   Response to Exercise   Blood Pressure (Admit) 100/60 mmHg   Blood Pressure (Exercise) 140/80 mmHg   Blood Pressure (Exit) 96/74 mmHg   Heart Rate (Admit) 93 bpm   Heart Rate (Exercise) 92 bpm   Heart Rate (Exit) 81 bpm   Oxygen Saturation (Admit) 96 %   Oxygen Saturation (Exercise) 94 %   Oxygen Saturation (Exit) 95 %   Rating of Perceived Exertion (Exercise) 13   Perceived Dyspnea (Exercise) 1   Symptoms --  peri-orbital edema, weight up 1.7 kg, does not feel well, ap   Comments heart failure clinic appt made for today   Duration Progress to 45 minutes of aerobic exercise without signs/symptoms of physical distress   Intensity THRR unchanged   Progression   Progression Continue to progress workloads to maintain intensity without signs/symptoms of physical distress.   Resistance Training   Training Prescription Yes   Weight orange bands   Reps 10-12   Interval Training   Interval Training No   Bike   Level 1   Minutes 15   Track   Laps 19   Minutes 15     Goals Met:  Exercise tolerated well No report of cardiac concerns or symptoms Strength training completed today  Goals Unmet:  Not Applicable  Comments: Service time is from 10:30am to 12:20pm    Dr. Rush Farmer is Medical Director for Pulmonary Rehab at [facility].

## 2015-04-08 ENCOUNTER — Encounter: Payer: Self-pay | Admitting: Internal Medicine

## 2015-04-09 ENCOUNTER — Encounter (HOSPITAL_COMMUNITY)
Admission: RE | Admit: 2015-04-09 | Discharge: 2015-04-09 | Disposition: A | Discharge status: Home / Self Care | Payer: Medicare Other | Source: Ambulatory Visit | Attending: Cardiology | Admitting: Cardiology

## 2015-04-09 DIAGNOSIS — I272 Other secondary pulmonary hypertension: Secondary | ICD-10-CM | POA: Insufficient documentation

## 2015-04-09 NOTE — Progress Notes (Signed)
Daily Session Note  Patient Details  Name: [name] MRN: [id number] Date of Birth: 1940/05/20 Referring Provider:  [name], MD  Encounter Date: 04/09/2015  Check In:     Session Check In - 04/09/15 1028    Check-In   [facility]   Staff Present Rodney Langton, Felipe Drone, RN, MHA;Jackie Russman Rollene Rotunda, RN, Levie Heritage, MA, ACSM RCEP, Exercise Physiologist   Supervising physician immediately available to respond to emergencies [facility] immediately available   Medication changes reported     No   Fall or balance concerns reported    No   Warm-up and Cool-down Performed as group-led instruction   Resistance Training Performed Yes   VAD Patient? No   Pain Assessment   Currently in Pain? No/denies   Multiple Pain Sites No      Capillary Blood Glucose: No results found for this or any previous visit (from the past 24 hour(s)).     POCT Glucose - 04/09/15 1303    POCT Blood Glucose   Pre-Exercise 235 mg/dL   Post-Exercise 348 mg/dL         Exercise Prescription Changes - 04/09/15 1303    Exercise Review   Progression No   Response to Exercise   Blood Pressure (Admit) 98/60 mmHg   Blood Pressure (Exercise) 116/60 mmHg   Blood Pressure (Exit) 96/59 mmHg   Heart Rate (Admit) 80 bpm   Heart Rate (Exercise) 92 bpm   Heart Rate (Exit) 86 bpm   Oxygen Saturation (Admit) 94 %   Oxygen Saturation (Exercise) 93 %   Oxygen Saturation (Exit) 95 %   Rating of Perceived Exertion (Exercise) 13   Perceived Dyspnea (Exercise) 1   Symptoms --  peri-orbital edema, weight up 1.7 kg, does not feel well, ap   Duration Progress to 45 minutes of aerobic exercise without signs/symptoms of physical distress   Intensity THRR unchanged   Progression   Progression Continue to progress workloads to maintain intensity without signs/symptoms of physical distress.   Resistance Training   Training Prescription Yes   Weight orange bands   Reps 10-12   Interval Training   Interval Training No   Bike   Level 1   Minutes 15   NuStep   Level 4   Minutes 15   METs 2.4   Track   Laps 14   Minutes 15     Goals Met:  Using PLB without cueing & demonstrates good technique Changing diet to healthy choices, watching portion sizes Exercise tolerated well No report of cardiac concerns or symptoms Strength training completed today  Goals Unmet:  Not Applicable  Comments: Service time is from 1030 to 1210   Dr. Rush Farmer is Medical Director for Pulmonary Rehab at [facility].

## 2015-04-11 ENCOUNTER — Encounter (HOSPITAL_COMMUNITY)
Admission: RE | Admit: 2015-04-11 | Discharge: 2015-04-11 | Disposition: A | Discharge status: Home / Self Care | Payer: Medicare Other | Source: Ambulatory Visit | Attending: Cardiology | Admitting: Cardiology

## 2015-04-11 VITALS — Wt 183.2 lb

## 2015-04-11 DIAGNOSIS — I480 Paroxysmal atrial fibrillation: Secondary | ICD-10-CM | POA: Diagnosis not present

## 2015-04-11 DIAGNOSIS — I2510 Atherosclerotic heart disease of native coronary artery without angina pectoris: Secondary | ICD-10-CM | POA: Diagnosis not present

## 2015-04-11 DIAGNOSIS — I272 Other secondary pulmonary hypertension: Secondary | ICD-10-CM | POA: Diagnosis not present

## 2015-04-11 NOTE — Progress Notes (Signed)
Daily Session Note  Patient Details  Name: [name] MRN: [id number] Date of Birth: [birth year] Referring Provider:  [name], MD  Encounter Date: 04/11/2015  Check In:     Session Check In - 04/11/15 1206    Check-In   [facility]   Staff Present Rosebud Poles, RN, BSN;Denim Kalmbach, MS, ACSM RCEP, Exercise Physiologist;Portia Rollene Rotunda, RN, Roque Cash, RN   Supervising physician immediately available to respond to emergencies [facility] immediately available   Physician(s) DR, Loleta Books   Medication changes reported     No   Fall or balance concerns reported    No   Warm-up and Cool-down Performed as group-led instruction   Resistance Training Performed Yes   VAD Patient? No   Pain Assessment   Currently in Pain? No/denies   Multiple Pain Sites No      Capillary Blood Glucose: No results found for this or any previous visit (from the past 24 hour(s)).     POCT Glucose - 04/11/15 1254    POCT Blood Glucose   Pre-Exercise 156 mg/dL   Post-Exercise 285 mg/dL         Exercise Prescription Changes - 04/11/15 1200    Exercise Review   Progression No   Response to Exercise   Blood Pressure (Admit) 70/24 mmHg   Blood Pressure (Exercise) 118/74 mmHg   Blood Pressure (Exit) 90/54 mmHg   Heart Rate (Admit) 71 bpm   Heart Rate (Exercise) 89 bpm   Heart Rate (Exit) 96 bpm   Oxygen Saturation (Admit) 98 %   Oxygen Saturation (Exercise) 93 %   Oxygen Saturation (Exit) 94 %   Rating of Perceived Exertion (Exercise) 10   Perceived Dyspnea (Exercise) 1   Symptoms Hypotension given water  peri-orbital edema, weight up 1.7 kg, does not feel well, ap   Duration Progress to 30 minutes of continuous aerobic without signs/symptoms of physical distress   Intensity THRR unchanged   Progression   Progression Continue to progress workloads to maintain intensity without signs/symptoms of physical distress.   Resistance Training   Training  Prescription Yes   Weight orange bands   Reps 10-12   Interval Training   Interval Training No   Bike   Level 1   Minutes 15   Track   Laps 16   Minutes 15     Goals Met:  Exercise tolerated well Strength training completed today  Goals Unmet:  BP  Comments: Service time is from 10:30am to 12:30pm    Dr. Rush Farmer is Medical Director for Pulmonary Rehab at [facility].

## 2015-04-12 ENCOUNTER — Encounter (INDEPENDENT_AMBULATORY_CARE_PROVIDER_SITE_OTHER): Payer: Medicare Other | Admitting: Ophthalmology

## 2015-04-12 DIAGNOSIS — I10 Essential (primary) hypertension: Secondary | ICD-10-CM

## 2015-04-12 DIAGNOSIS — H35033 Hypertensive retinopathy, bilateral: Secondary | ICD-10-CM

## 2015-04-12 DIAGNOSIS — H338 Other retinal detachments: Secondary | ICD-10-CM

## 2015-04-12 DIAGNOSIS — H353131 Nonexudative age-related macular degeneration, bilateral, early dry stage: Secondary | ICD-10-CM

## 2015-04-12 DIAGNOSIS — H43813 Vitreous degeneration, bilateral: Secondary | ICD-10-CM | POA: Diagnosis not present

## 2015-04-12 DIAGNOSIS — D3131 Benign neoplasm of right choroid: Secondary | ICD-10-CM | POA: Diagnosis not present

## 2015-04-12 DIAGNOSIS — H34832 Tributary (branch) retinal vein occlusion, left eye, with macular edema: Secondary | ICD-10-CM | POA: Diagnosis not present

## 2015-04-16 ENCOUNTER — Other Ambulatory Visit: Payer: Self-pay | Admitting: *Deleted

## 2015-04-16 ENCOUNTER — Encounter (HOSPITAL_COMMUNITY)
Admission: RE | Admit: 2015-04-16 | Discharge: 2015-04-16 | Disposition: A | Discharge status: Home / Self Care | Payer: Medicare Other | Source: Ambulatory Visit | Attending: Cardiology | Admitting: Cardiology

## 2015-04-16 ENCOUNTER — Ambulatory Visit: Payer: Self-pay | Admitting: *Deleted

## 2015-04-16 VITALS — Wt 188.7 lb

## 2015-04-16 DIAGNOSIS — L570 Actinic keratosis: Secondary | ICD-10-CM | POA: Diagnosis not present

## 2015-04-16 DIAGNOSIS — I272 Other secondary pulmonary hypertension: Secondary | ICD-10-CM | POA: Diagnosis not present

## 2015-04-16 DIAGNOSIS — S8010XA Contusion of unspecified lower leg, initial encounter: Secondary | ICD-10-CM | POA: Diagnosis not present

## 2015-04-16 DIAGNOSIS — L723 Sebaceous cyst: Secondary | ICD-10-CM | POA: Diagnosis not present

## 2015-04-16 NOTE — Patient Outreach (Signed)
[facility]) Care Management  04/16/2015  CROWLEY KRASNER 1940/09/25 [id number]   RN Health Coach attempted #1  Follow up outreach call to patient.  Patient was unavailable. HIPPA compliance voicemail message was left with return callback number.    [facility] [phone]

## 2015-04-16 NOTE — Progress Notes (Signed)
Daily Session Note  Patient Details  Name: [name] MRN: [id number] Date of Birth: 10/30/1940 Referring Provider:  [name], MD  Encounter Date: 04/16/2015  Check In:     Session Check In - 04/16/15 1109    Check-In   [facility]   Staff Present Rodney Langton, RN;Portia Rollene Rotunda, RN, Deland Pretty, MS, ACSM CEP, Exercise Physiologist;Kasi Lasky, MS, ACSM RCEP, Exercise Physiologist   Supervising physician immediately available to respond to emergencies [facility] immediately available   Physician(s) Dr. Marily Memos   Medication changes reported     No   Fall or balance concerns reported    No   Warm-up and Cool-down Performed as group-led instruction   Resistance Training Performed Yes   VAD Patient? No   Pain Assessment   Currently in Pain? No/denies   Multiple Pain Sites No      Capillary Blood Glucose: No results found for this or any previous visit (from the past 24 hour(s)).     POCT Glucose - 04/16/15 1206    POCT Blood Glucose   Pre-Exercise 174 mg/dL   Post-Exercise 242 mg/dL         Exercise Prescription Changes - 04/16/15 1200    Response to Exercise   Blood Pressure (Admit) 102/60 mmHg   Blood Pressure (Exercise) 124/64 mmHg   Blood Pressure (Exit) 116/54 mmHg   Heart Rate (Admit) 72 bpm   Heart Rate (Exercise) 95 bpm   Heart Rate (Exit) 84 bpm   Oxygen Saturation (Admit) 98 %   Oxygen Saturation (Exercise) 92 %   Oxygen Saturation (Exit) 97 %   Rating of Perceived Exertion (Exercise) 10   Perceived Dyspnea (Exercise) 1   Symptoms --  peri-orbital edema, weight up 1.7 kg, does not feel well, ap   Duration Progress to 30 minutes of continuous aerobic without signs/symptoms of physical distress   Intensity THRR unchanged   Progression   Progression Continue to progress workloads to maintain intensity without signs/symptoms of physical distress.   Resistance Training   Training Prescription Yes   Weight Blue bands   Reps 10-12   Interval Training   Interval Training No   Bike   Level 1   Minutes 15   NuStep   Level 6   Minutes 15   METs 2.8   Track   Laps 14   Minutes 15     Goals Met:  Using PLB without cueing & demonstrates good technique Exercise tolerated well No report of cardiac concerns or symptoms Strength training completed today  Goals Unmet:  Not Applicable  Comments: Service time is from 10:30am to 12:05pm    Dr. Rush Farmer is Medical Director for Pulmonary Rehab at [facility].

## 2015-04-17 ENCOUNTER — Other Ambulatory Visit: Payer: Self-pay | Admitting: *Deleted

## 2015-04-17 ENCOUNTER — Ambulatory Visit: Payer: Self-pay | Admitting: Physician Assistant

## 2015-04-18 ENCOUNTER — Encounter (HOSPITAL_COMMUNITY)
Admission: RE | Admit: 2015-04-18 | Discharge: 2015-04-18 | Disposition: A | Discharge status: Home / Self Care | Payer: Medicare Other | Source: Ambulatory Visit | Attending: Cardiology | Admitting: Cardiology

## 2015-04-18 VITALS — Wt 186.7 lb

## 2015-04-18 DIAGNOSIS — I272 Other secondary pulmonary hypertension: Secondary | ICD-10-CM | POA: Diagnosis not present

## 2015-04-18 NOTE — Progress Notes (Signed)
Daily Session Note  Patient Details  Name: [name] MRN: [id number] Date of Birth: 03/05/1940 Referring Provider:    Encounter Date: 04/18/2015  Check In:     Session Check In - 04/18/15 1058    Check-In   Location [facility]   Staff Present Molly diVincenzo, MS, ACSM RCEP, Exercise Physiologist;Maria Whitaker, RN, BSN;Annedrea Stackhouse, RN, MHA;Portia Payne, RN, BSN   Supervising physician immediately available to respond to emergencies [facility] immediately available   Physician(s) Dr. Hobbs   Medication changes reported     No   Fall or balance concerns reported    No   Warm-up and Cool-down Performed as group-led instruction   Resistance Training Performed No   VAD Patient? No   Pain Assessment   Currently in Pain? No/denies      Capillary Blood Glucose: No results found for this or any previous visit (from the past 24 hour(s)).     POCT Glucose - 04/18/15 1338    POCT Blood Glucose   Pre-Exercise 174 mg/dL   Post-Exercise 228 mg/dL         Exercise Prescription Changes - 04/18/15 1300    Exercise Review   Progression No   Response to Exercise   Blood Pressure (Admit) 96/52 mmHg   Blood Pressure (Exercise) 128/66 mmHg   Blood Pressure (Exit) 90/50 mmHg   Heart Rate (Admit) 62 bpm   Heart Rate (Exercise) 79 bpm   Heart Rate (Exit) 67 bpm   Oxygen Saturation (Admit) 94 %   Oxygen Saturation (Exercise) 95 %   Oxygen Saturation (Exit) 96 %   Rating of Perceived Exertion (Exercise) 11   Perceived Dyspnea (Exercise) 1   Duration Progress to 45 minutes of aerobic exercise without signs/symptoms of physical distress   Intensity THRR unchanged   Progression   Progression Continue to progress workloads to maintain intensity without signs/symptoms of physical distress.   Resistance Training   Training Prescription Yes   Weight blue bands   Reps 10-12   Interval Training   Interval Training No   Bike   Level 1   Minutes 15   Track   Laps 20   Minutes 15     Goals Met:  Exercise tolerated well No report of cardiac concerns or symptoms Strength training completed today  Goals Unmet:  Not Applicable  Comments: Service time is from 1030 to 1215    Dr. Wesam G. Yacoub is Medical Director for Pulmonary Rehab at [facility]. 

## 2015-04-22 ENCOUNTER — Other Ambulatory Visit: Payer: Self-pay | Admitting: Internal Medicine

## 2015-04-22 ENCOUNTER — Encounter: Payer: Self-pay | Admitting: Internal Medicine

## 2015-04-22 MED ORDER — DULAGLUTIDE 0.75 MG/0.5ML ~~LOC~~ SOAJ: 1.0000 "pen " | SUBCUTANEOUS | Status: DC

## 2015-04-23 ENCOUNTER — Encounter (HOSPITAL_COMMUNITY)
Admission: RE | Admit: 2015-04-23 | Discharge: 2015-04-23 | Disposition: A | Discharge status: Home / Self Care | Payer: Medicare Other | Source: Ambulatory Visit | Attending: Cardiology | Admitting: Cardiology

## 2015-04-23 VITALS — Wt 186.7 lb

## 2015-04-23 DIAGNOSIS — I272 Other secondary pulmonary hypertension: Secondary | ICD-10-CM | POA: Diagnosis not present

## 2015-04-23 NOTE — Progress Notes (Signed)
Daily Session Note  Patient Details  Name: [name] MRN: [id number] Date of Birth: 1940-06-13 Referring Provider:    Encounter Date: 04/23/2015  Check In:     Session Check In - 04/23/15 1030    Check-In   Staff Present Rosebud Poles, RN, BSN;Ramon Dredge, RN, MHA;Molly diVincenzo, MS, ACSM RCEP, Exercise Physiologist;Broxton Broady Rollene Rotunda, RN, BSN   Supervising physician immediately available to respond to emergencies [facility] immediately available   Physician(s) Dr. Marily Memos   Medication changes reported     No   Fall or balance concerns reported    No   Warm-up and Cool-down Performed as group-led instruction   Resistance Training Performed Yes   VAD Patient? No   Pain Assessment   Currently in Pain? No/denies      Capillary Blood Glucose: No results found for this or any previous visit (from the past 24 hour(s)).     POCT Glucose - 04/23/15 1239    POCT Blood Glucose   Pre-Exercise 133 mg/dL   Post-Exercise 242 mg/dL         Exercise Prescription Changes - 04/23/15 1240    Response to Exercise   Blood Pressure (Admit) 92/50 mmHg   Blood Pressure (Exercise) 102/70 mmHg   Blood Pressure (Exit) 92/58 mmHg   Heart Rate (Admit) 80 bpm   Heart Rate (Exercise) 92 bpm   Heart Rate (Exit) 70 bpm   Oxygen Saturation (Admit) 96 %   Oxygen Saturation (Exercise) 94 %   Oxygen Saturation (Exit) 97 %   Rating of Perceived Exertion (Exercise) 12   Perceived Dyspnea (Exercise) 1   Duration Progress to 45 minutes of aerobic exercise without signs/symptoms of physical distress   Intensity THRR unchanged   Progression   Progression Continue to progress workloads to maintain intensity without signs/symptoms of physical distress.   Resistance Training   Training Prescription Yes   Weight blue bands   Reps 10-12   Interval Training   Interval Training No   Bike   Level 1   Minutes 15   NuStep   Level 6   Minutes 15   METs 3   Track   Laps 17   Minutes 15      Goals Met:  Independence with exercise equipment Improved SOB with ADL's Using PLB without cueing & demonstrates good technique Exercise tolerated well No report of cardiac concerns or symptoms Strength training completed today  Goals Unmet:  Not Applicable  Comments: Service time is from 1030 to 1210   Dr. Rush Farmer is Medical Director for Pulmonary Rehab at [facility].

## 2015-04-25 ENCOUNTER — Encounter (HOSPITAL_COMMUNITY)
Admission: RE | Admit: 2015-04-25 | Discharge: 2015-04-25 | Disposition: A | Discharge status: Home / Self Care | Payer: Medicare Other | Source: Ambulatory Visit | Attending: Cardiology | Admitting: Cardiology

## 2015-04-25 VITALS — Wt 188.9 lb

## 2015-04-25 DIAGNOSIS — I272 Other secondary pulmonary hypertension: Secondary | ICD-10-CM | POA: Diagnosis not present

## 2015-04-25 NOTE — Progress Notes (Signed)
Daily Session Note  Patient Details  Name: [name] MRN: [id number] Date of Birth: [birth year] Referring Provider:    Encounter Date: 04/25/2015  Check In:     Session Check In - 04/25/15 1058    Check-In   [facility]   Staff Present Rosebud Poles, RN, BSN;Molly diVincenzo, MS, ACSM RCEP, Exercise Physiologist;Honore Wipperfurth Ysidro Evert, RN;Portia Rollene Rotunda, RN, BSN   Supervising physician immediately available to respond to emergencies [facility] immediately available   Physician(s) Dr. Marily Memos   Medication changes reported     No   Fall or balance concerns reported    No   Warm-up and Cool-down Performed as group-led instruction   Resistance Training Performed Yes   VAD Patient? No   Pain Assessment   Currently in Pain? No/denies   Multiple Pain Sites No      Capillary Blood Glucose: No results found for this or any previous visit (from the past 24 hour(s)).     POCT Glucose - 04/25/15 1236    POCT Blood Glucose   Pre-Exercise 157 mg/dL   Post-Exercise 203 mg/dL         Exercise Prescription Changes - 04/25/15 1200    Response to Exercise   Blood Pressure (Admit) 114/58 mmHg   Blood Pressure (Exercise) 118/70 mmHg   Blood Pressure (Exit) 104/56 mmHg   Heart Rate (Admit) 67 bpm   Heart Rate (Exercise) 85 bpm   Heart Rate (Exit) 73 bpm   Oxygen Saturation (Admit) 96 %   Oxygen Saturation (Exercise) 93 %   Oxygen Saturation (Exit) 93 %   Rating of Perceived Exertion (Exercise) 11   Perceived Dyspnea (Exercise) 1.5   Duration Progress to 45 minutes of aerobic exercise without signs/symptoms of physical distress   Intensity THRR unchanged   Progression   Progression Continue to progress workloads to maintain intensity without signs/symptoms of physical distress.   Resistance Training   Training Prescription Yes   Weight blue bands   Reps 10-12   Interval Training   Interval Training No   NuStep   Level 6   Minutes 15   METs 3.1   Track    Laps 21   Minutes 15     Goals Met:  Independence with exercise equipment Exercise tolerated well No report of cardiac concerns or symptoms Strength training completed today  Goals Unmet:  Not Applicable  Comments: Service time is from 1030 to 1230    Dr. Rush Farmer is Medical Director for Pulmonary Rehab at [facility]

## 2015-04-30 ENCOUNTER — Encounter (HOSPITAL_COMMUNITY)
Admission: RE | Admit: 2015-04-30 | Discharge: 2015-04-30 | Disposition: A | Discharge status: Home / Self Care | Payer: Medicare Other | Source: Ambulatory Visit | Attending: Internal Medicine | Admitting: Internal Medicine

## 2015-04-30 VITALS — Wt 188.5 lb

## 2015-04-30 DIAGNOSIS — I272 Other secondary pulmonary hypertension: Secondary | ICD-10-CM | POA: Diagnosis not present

## 2015-04-30 NOTE — Progress Notes (Signed)
Daily Session Note  Patient Details  Name: [name] MRN: [id number] Date of Birth: May 28, 1940 Referring Provider:    Encounter Date: 04/30/2015  Check In:     Session Check In - 04/30/15 1030    Check-In   [facility]   Staff Present Rosebud Poles, RN, BSN;Lisa Ysidro Evert, RN;Joann Rion, RN, Luisa Hart, RN, [facility] diVincenzo, MS, ACSM RCEP, Exercise Physiologist   Supervising physician immediately available to respond to emergencies [facility] immediately available   Physician(s) Dr. Marily Memos   Medication changes reported     No   Fall or balance concerns reported    No   Warm-up and Cool-down Performed as group-led instruction   Resistance Training Performed Yes   VAD Patient? No   Pain Assessment   Currently in Pain? No/denies   Multiple Pain Sites No      Capillary Blood Glucose: No results found for this or any previous visit (from the past 24 hour(s)).      Exercise Prescription Changes - 04/30/15 1200    Response to Exercise   Blood Pressure (Admit) 130/70 mmHg   Blood Pressure (Exercise) 124/74 mmHg   Blood Pressure (Exit) 102/50 mmHg   Heart Rate (Admit) 74 bpm   Heart Rate (Exercise) 103 bpm   Heart Rate (Exit) 61 bpm   Oxygen Saturation (Admit) 98 %   Oxygen Saturation (Exercise) 92 %   Oxygen Saturation (Exit) 97 %   Rating of Perceived Exertion (Exercise) 11   Perceived Dyspnea (Exercise) 1   Duration Progress to 45 minutes of aerobic exercise without signs/symptoms of physical distress   Intensity THRR unchanged   Progression   Progression Continue to progress workloads to maintain intensity without signs/symptoms of physical distress.   Resistance Training   Training Prescription Yes   Weight blue bands   Reps 10-12   Interval Training   Interval Training No   Bike   Level 1   Minutes 15   NuStep   Level 6   Minutes 15   METs 2.9   Track   Laps 19   Minutes 15     Goals Met:  Independence with  exercise equipment Improved SOB with ADL's Exercise tolerated well Strength training completed today  Goals Unmet:  Not Applicable  Comments: Service time is from 1030 to 1205    Dr. Rush Farmer is Medical Director for Pulmonary Rehab at [facility].

## 2015-05-02 ENCOUNTER — Encounter (HOSPITAL_COMMUNITY)
Admission: RE | Admit: 2015-05-02 | Discharge: 2015-05-02 | Disposition: A | Discharge status: Home / Self Care | Payer: Medicare Other | Source: Ambulatory Visit | Attending: Internal Medicine | Admitting: Internal Medicine

## 2015-05-02 DIAGNOSIS — I272 Other secondary pulmonary hypertension: Secondary | ICD-10-CM | POA: Diagnosis not present

## 2015-05-07 ENCOUNTER — Encounter (HOSPITAL_COMMUNITY)
Admission: RE | Admit: 2015-05-07 | Discharge: 2015-05-07 | Disposition: A | Discharge status: Home / Self Care | Payer: Medicare Other | Source: Ambulatory Visit | Attending: Cardiology | Admitting: Cardiology

## 2015-05-07 ENCOUNTER — Ambulatory Visit (HOSPITAL_COMMUNITY)
Admission: RE | Admit: 2015-05-07 | Discharge: 2015-05-07 | Disposition: A | Discharge status: Home / Self Care | Payer: Medicare Other | Source: Ambulatory Visit | Attending: Internal Medicine | Admitting: Internal Medicine

## 2015-05-07 ENCOUNTER — Encounter (HOSPITAL_COMMUNITY): Payer: Self-pay | Admitting: Internal Medicine

## 2015-05-07 ENCOUNTER — Encounter (HOSPITAL_COMMUNITY): Payer: Self-pay | Admitting: Nurse Practitioner

## 2015-05-07 ENCOUNTER — Ambulatory Visit (HOSPITAL_BASED_OUTPATIENT_CLINIC_OR_DEPARTMENT_OTHER)
Admission: RE | Admit: 2015-05-07 | Discharge: 2015-05-07 | Disposition: A | Discharge status: Home / Self Care | Payer: Medicare Other | Source: Ambulatory Visit | Attending: Nurse Practitioner | Admitting: Nurse Practitioner

## 2015-05-07 VITALS — BP 118/62 | HR 74 | Ht 71.0 in | Wt 191.4 lb

## 2015-05-07 VITALS — BP 118/62 | HR 74 | Wt 191.2 lb

## 2015-05-07 DIAGNOSIS — Z8249 Family history of ischemic heart disease and other diseases of the circulatory system: Secondary | ICD-10-CM | POA: Diagnosis not present

## 2015-05-07 DIAGNOSIS — Q339 Congenital malformation of lung, unspecified: Secondary | ICD-10-CM | POA: Insufficient documentation

## 2015-05-07 DIAGNOSIS — I272 Other secondary pulmonary hypertension: Secondary | ICD-10-CM | POA: Insufficient documentation

## 2015-05-07 DIAGNOSIS — I2510 Atherosclerotic heart disease of native coronary artery without angina pectoris: Secondary | ICD-10-CM | POA: Diagnosis not present

## 2015-05-07 DIAGNOSIS — I480 Paroxysmal atrial fibrillation: Secondary | ICD-10-CM | POA: Diagnosis not present

## 2015-05-07 DIAGNOSIS — Z79899 Other long term (current) drug therapy: Secondary | ICD-10-CM | POA: Diagnosis not present

## 2015-05-07 DIAGNOSIS — E1122 Type 2 diabetes mellitus with diabetic chronic kidney disease: Secondary | ICD-10-CM | POA: Diagnosis not present

## 2015-05-07 DIAGNOSIS — Q263 Partial anomalous pulmonary venous connection: Secondary | ICD-10-CM

## 2015-05-07 DIAGNOSIS — Z7901 Long term (current) use of anticoagulants: Secondary | ICD-10-CM | POA: Diagnosis not present

## 2015-05-07 DIAGNOSIS — G4733 Obstructive sleep apnea (adult) (pediatric): Secondary | ICD-10-CM | POA: Insufficient documentation

## 2015-05-07 DIAGNOSIS — N183 Chronic kidney disease, stage 3 (moderate): Secondary | ICD-10-CM | POA: Insufficient documentation

## 2015-05-07 DIAGNOSIS — Z85828 Personal history of other malignant neoplasm of skin: Secondary | ICD-10-CM | POA: Diagnosis not present

## 2015-05-07 DIAGNOSIS — Z7984 Long term (current) use of oral hypoglycemic drugs: Secondary | ICD-10-CM | POA: Diagnosis not present

## 2015-05-07 DIAGNOSIS — K219 Gastro-esophageal reflux disease without esophagitis: Secondary | ICD-10-CM | POA: Diagnosis not present

## 2015-05-07 DIAGNOSIS — Z951 Presence of aortocoronary bypass graft: Secondary | ICD-10-CM | POA: Insufficient documentation

## 2015-05-07 DIAGNOSIS — I130 Hypertensive heart and chronic kidney disease with heart failure and stage 1 through stage 4 chronic kidney disease, or unspecified chronic kidney disease: Secondary | ICD-10-CM | POA: Insufficient documentation

## 2015-05-07 DIAGNOSIS — E785 Hyperlipidemia, unspecified: Secondary | ICD-10-CM | POA: Diagnosis not present

## 2015-05-07 DIAGNOSIS — Z87891 Personal history of nicotine dependence: Secondary | ICD-10-CM | POA: Diagnosis not present

## 2015-05-07 DIAGNOSIS — I5032 Chronic diastolic (congestive) heart failure: Secondary | ICD-10-CM | POA: Insufficient documentation

## 2015-05-07 DIAGNOSIS — I4891 Unspecified atrial fibrillation: Secondary | ICD-10-CM | POA: Diagnosis not present

## 2015-05-07 NOTE — Addendum Note (Signed)
Encounter addended by: Scarlette Calico, RN on: 05/07/2015  9:33 AM<BR>     Documentation filed: Patient Instructions Section

## 2015-05-07 NOTE — Progress Notes (Signed)
Patient ID: [name], male   DOB: [birth year], 75 y.o.   MRN: [id number]     Primary Care Physician: Alesia Richards, MD Referring Physician: Dr. Roxy Manns EP: Dr. Shirlee Latch [name] is a 75 y.o. male with a h/o s/p CAD, afib with bypass and maze procedure,4/15, congenital lung disease and chronic diastolic heart failure,followed by pulmonary/advanced HF clinic respectively. This appointment is for one year f/u for Dr. Roxy Manns to track outcome of maze procedure for afib. Pt did have to have a AFL ablation 10/15 but has been doing well since then with no sustained aflutter. Few irregular heart beats from time to time. He has been doing much better from lung disease/HF perspective with diuretic use. He has finished cardiac rehab and continues to exercise at the [facility]. Feels his quality of life is much improved from last year. He continues on warfarin. Amiodarone was stopped after  surgery.  Today, he denies symptoms of palpitations, chest pain, shortness of breath, orthopnea, PND, lower extremity edema, dizziness, presyncope, syncope, or neurologic sequela. The patient is tolerating medications without difficulties and is otherwise without complaint today.   Past Medical History  Diagnosis Date  . Hx of adenomatous colonic polyps   . Diabetes mellitus type II   . Hypertension   . Diverticulosis 2001  . Hyperlipidemia   . Persistent atrial fibrillation ([place])     a. s/p MAZE 04/2013 in setting of CABG. b. Amio stopped in 10/2013 after flutter ablation.  . Obstructive sleep apnea     compliant with CPAP  . Chronic diastolic congestive heart failure ([place])   . Hypertensive cardiomyopathy ([place])   . History of cardioversion     x3 (years uncertain)  . H/O hiatal hernia   . Basal cell carcinoma   . Adrenal adenoma   . CAD (coronary artery disease)     a. 04/2013 CABG x 2: LIMA to LAD, SVG to RI, EVH via R thigh.  . S/P Maze operation for atrial fibrillation     a. 04/2013: Complete bilateral  atrial lesion set using cryothermy and bipolar radiofrequency ablation with clipping of LA appendage (@ time of CABG)  . GERD (gastroesophageal reflux disease)   . Depression   . Anxiety   . DJD (degenerative joint disease)   . Atypical atrial flutter ([place]) 8/15, 10/15    a. DCCV 08/2013. b. s/p RFA 10/2013.  [name] Respiratory failure ([place])     a. Hypoxia 10/2013 - required supp O2 as inpatient, did not require it at discharge.  . Pleural effusion, left     a. s/p thoracentesis 05/2013.  [name] PFO (patent foramen ovale)     a. Small PFO by TEE 10/2013.  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 S/P Maze operation for atrial fibrillation 04/05/2013    Complete bilateral atrial lesion set using cryothermy and bipolar radiofrequency ablation with clipping of LA appendage via median sternotomy approach    Past Surgical History  Procedure Laterality Date  . Polinydal cyst      Removed  . Great toe arthrodesis, interphalangeal joint      Right foot  . Retina repair-right    . Cataract extraction      bilateral  . Basal cell carcinoma excision      x3 on face  . Polypectomy    . Cardiac catheterization      myocardial  bridge but no cad  . Coronary artery bypass graft N/A 04/05/2013    Procedure: CORONARY ARTERY BYPASS GRAFTING (CABG) TIMES TWO USING LEFT INTERNAL MAMMARY ARTERY AND RIGHT SAPHENOUS LEG VEIN HARVESTED ENDOSCOPICALLY;  Surgeon: Rexene Alberts, MD;  Location: [place];  Service: Open Heart Surgery;  Laterality: N/A;  . Maze N/A 04/05/2013    Procedure: MAZE;  Surgeon: Rexene Alberts, MD;  Location: [place];  Service: Open Heart Surgery;  Laterality: N/A;  . Intraoperative transesophageal echocardiogram N/A 04/05/2013    Procedure: INTRAOPERATIVE TRANSESOPHAGEAL ECHOCARDIOGRAM;  Surgeon: Rexene Alberts, MD;  Location: [place];  Service: Open Heart Surgery;  Laterality: N/A;  . Tee without  cardioversion N/A 08/23/2013    Procedure: TRANSESOPHAGEAL ECHOCARDIOGRAM (TEE);  Surgeon: Sanda Klein, MD;  Location: [place];  Service: Cardiovascular;  Laterality: N/A;  . Cardioversion N/A 08/23/2013    Procedure: CARDIOVERSION;  Surgeon: Sanda Klein, MD;  [facility];  Service: Cardiovascular;  Laterality: N/A;  . Tee without cardioversion N/A 10/26/2013    Procedure: TRANSESOPHAGEAL ECHOCARDIOGRAM (TEE);  Surgeon: Sueanne Margarita, MD;  Location: [facility] ENDOSCOPY;  Service: Cardiovascular;  Laterality: N/A;  . Left heart catheterization with coronary angiogram N/A 03/07/2013    Procedure: LEFT HEART CATHETERIZATION WITH CORONARY ANGIOGRAM;  Surgeon: Burnell Blanks, MD;  Location: [facility] CATH LAB;  Service: Cardiovascular;  Laterality: N/A;  . Atrial fibrillation ablation N/A 10/26/2013    Procedure: ATRIAL FIBRILLATION ABLATION;  Surgeon: Coralyn Mark, MD;  Location: [place] CATH LAB;  Service: Cardiovascular;  Laterality: N/A;  . Carpometacarpel suspension plasty Left 02/14/2014    Procedure: CARPOMETACARPEL ([place]) SUSPENSIONPLASTY THUMB  WITH  ABDUCTOR POLLICIS LONGUS TRANSFER AND STENOSING TENOSYNOVITIS RELEASE LEFT WRIST;  Surgeon: Charlotte Crumb, MD;  Location: [place];  Service: Orthopedics;  Laterality: Left;  . Trapezium resection    . Right heart catheterization N/A 05/03/2014    Procedure: RIGHT HEART CATH;  Surgeon: Jolaine Artist, MD;  Location[facility] CATH LAB;  Service: Cardiovascular;  Laterality: N/A;  . Tee without cardioversion N/A 06/05/2014    Procedure: TRANSESOPHAGEAL ECHOCARDIOGRAM (TEE);  Surgeon: Thayer Headings, MD;  Location[facility] ENDOSCOPY;  Service: Cardiovascular;  Laterality: N/A;    Current Outpatient Prescriptions  Medication Sig Dispense Refill  . ALPRAZolam (XANAX) 1 MG tablet Take 0.5 mg by mouth at bedtime as needed for sleep. Reported on 01/10/2015    . atorvastatin (LIPITOR) 40 MG tablet TAKE 1 TABLET BY MOUTH EVERY DAY 30  tablet 1  . B Complex Vitamins (VITAMIN B COMPLEX PO) Take 1 tablet by mouth daily.    . Blood Glucose Monitoring Suppl (ACCU-CHEK AVIVA PLUS) w/Device KIT Check blood sugar 1 time daily-DX-E11.29 1 kit 0  . carvedilol (COREG) 6.25 MG tablet TAKE 1 TABLET (6.25 MG TOTAL) BY MOUTH 2 (TWO) TIMES DAILY. 60 tablet 6  . cholecalciferol (VITAMIN D) 1000 UNITS tablet Take 4,000-5,000 Units by mouth See admin instructions. Take 4 tablets four times a week and take 5 tablets three times a week    . diphenhydrAMINE (BENADRYL) 25 MG tablet Take 25 mg by mouth at bedtime as needed for allergies.     . Dulaglutide (TRULICITY) 3.57 SV/7.7LT SOPN Inject 1 pen into the skin once a week. 0.5 mL 3  . fluticasone (FLONASE) 50 MCG/ACT nasal spray Place into both nostrils daily.    [name] glimepiride (AMARYL) 4 MG tablet Take 1/2 to 1 tablet 2 x day for Diabetes (Patient taking differently: Take 4 mg  by mouth 2 (two) times daily. ) 180 tablet 99  . glucose blood (ACCU-CHEK AVIVA PLUS) test strip Check blood glucose 3 xdaily.   DX-E11.29 - fluctuating glucoses - uncontrolled Diabetes 300 each 12  . HYDROcodone-acetaminophen (NORCO/VICODIN) 5-325 MG per tablet Take 1 tablet by mouth every 6 (six) hours as needed for moderate pain. 30 tablet   . loratadine (CLARITIN) 10 MG tablet Take 10 mg by mouth daily.    . metFORMIN (GLUCOPHAGE-XR) 500 MG 24 hr tablet Take 2 tablets (1,000 mg total) by mouth 2 (two) times daily. 360 tablet 4  . metolazone (ZAROXOLYN) 5 MG tablet Take 5 mg by mouth daily as needed (weight gain and edema).    . Multiple Vitamin (MULTIVITAMIN WITH MINERALS) TABS tablet Take 1 tablet by mouth daily.    [name] nystatin (MYCOSTATIN) 100000 UNIT/ML suspension Please use 4-6 mL QID swish and gargle for as long as possible and then spit out. 60 mL 0  . potassium chloride SA (K-DUR,KLOR-CON) 20 MEQ tablet Take 2 tablets (40 mEq total) by mouth daily. (Patient taking differently: Take 20 mEq by mouth 2 (two) times daily. )  90 tablet 3  . ranitidine (ZANTAC) 150 MG tablet Take 150 mg by mouth daily as needed for heartburn.     . sertraline (ZOLOFT) 100 MG tablet TAKE 1 TABLET BY MOUTH DAILY 30 tablet 11  . torsemide (DEMADEX) 100 MG tablet Take 100 mg in AM and 50 mg in PM if needed for weight gain    . valsartan (DIOVAN) 320 MG tablet TAKE 1 TABLET (320 MG TOTAL) BY MOUTH DAILY. 90 tablet 0  . warfarin (COUMADIN) 5 MG tablet TAKE 1 TABLET BY MOUTH TWICE A DAY (Patient taking differently: 22m Monday, Wednesday, Friday, 10.564mSunday, Tuesday, Thurs, Sat) 180 tablet 3   No current facility-administered medications for this encounter.    Allergies  Allergen Reactions  . Sunflower Seed [Sunflower Oil] Swelling and Other (See Comments)    Tongue and lip swelling  . Horse-Derived Products Other (See Comments)    Per allergy skin test  . Tetanus Toxoids Other (See Comments)    Per allergy skin test    Social History   Social History  . Marital Status: Married    Spouse Name: N/A  . Number of Children: 1  . Years of Education: N/A   Occupational History  . retired phSoftware engineer  Social History Main Topics  . Smoking status: Former Smoker -- 4.00 packs/day for 25 years    Types: Cigarettes    Quit date: 01/05/1981  . Smokeless tobacco: Never Used  . Alcohol Use: 0.6 oz/week    1 Shots of liquor per week     Comment: 1-5 drinks per week  . Drug Use: No  . Sexual Activity: Not on file   Other Topics Concern  . Not on file   Social History Narrative   Daily caffeine-yes   Patient gets regular exercise.   [place]  Retired phSoftware engineer  Family History  Problem Relation Age of Onset  . Colon cancer Mother     Family History/Uncle   . Colon polyps Mother     Family History  . Atrial fibrillation Mother   . Hypertension Mother   . Colon polyps Sister     Family history  . Diabetes Maternal Uncle   . Stroke Paternal Uncle   . Dementia Father     ROS- All systems are reviewed and negative except as per the HPI above  Physical Exam: Filed Vitals:   05/07/15 0839  BP: 118/62  Pulse: 74  Height: _0  (1.803 m)  Weight: 191 lb 6.4 oz (86.818 kg)    GEN- The patient is well appearing, alert and oriented x 3 today.   Head- normocephalic, atraumatic Eyes-  Sclera clear, conjunctiva pink Ears- hearing intact Oropharynx- clear Neck- supple, no JVP Lymph- no cervical lymphadenopathy Lungs- Clear to ausculation bilaterally, normal work of breathing Heart- Regular rate and rhythm, 1/78mrmur, no rubs or gallops, PMI not laterally displaced GI- soft, NT, ND, + BS Extremities- no clubbing, cyanosis, or edema MS- no significant deformity or atrophy Skin- no rash or lesion Psych- euthymic mood, full affect Neuro- strength and sensation are intact  EKG- SR with first degree AVB at 74  bpm, pr int 372 ms, NS st/t wave abnormality Epic records reviewed  Assessment and Plan: 1. AF/AFL S/p maze procedure and is doing well without recurrent arrhyhtmia Continue warfarin for chadsvasc of at least 6 Continue BB  2. Congential lung disease Per pulmonology  3. Diastolic HF Per HF clinic  4. HTN Well managed   F/u long term surveillance of maze procedure with afib clinic in one year  DButch PennyC. Marylen Zuk, AHalls[facility] Hospital[address]GLakeside [place] [phone]

## 2015-05-07 NOTE — Progress Notes (Signed)
Pulmonary Rehab Discharge Note: [name] has been discharged after completing 23 exercise and education sessions. [name] increased his stamina and strength while in the program as evidanced by his ability to walk an additional 67f on his discharge 6 min walk test as compared to his admission. HRamzyalso made significant improvement in his shortness of breath and grip strength. His shortness of breath score decreased from 28 to 11 and his grip strength increased by 4. He also met his personal goals of increasing his stamina and strength, build some upper body mass, and he feels he is able to enjoy gardening and woodworking again. HKeyaanmade significant improvements while in the program and it was a pleasure working with him. [facility]

## 2015-05-07 NOTE — Progress Notes (Signed)
Patient ID: [name], male   DOB: 1940/08/27, 75 y.o.   MRN: [id number]   Advanced Heart Failure Clinic Note   Patient ID: [name], male   DOB: 1940/06/13, 75 y.o.   MRN: [id number]  Primary Cardiologist: [name]   Subjective:  [name] is a 75 y/o male with COPD , DM, PAF, 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, CKD, AFL s/p ablation in 10/15. 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 Had surgery in 4/15. Did well for awhile went to cardiac rehab and was feeling fine. Could do almost anything he wanted to do. Developed AFL in 10/15 and underwent RFA. Felt good.   In 3/16 began to develop severe SOB. Started O2. Says his symptoms got worse almost overnight. 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 He has seen Dr. Roxy Manns but felt to have no optimal surgical options for repair. His case was also presented to Dr. Michaelle Birks at [facility] who agreed that there was no way to baffle or reroute the anomalous PV flow to the LA. He had a TEE which showed LVEF 60-65% with a dilated right side and a small PFO. He has also been seen by Dr. Lake Bells who performed PFTs that showed significant restrictive lung disease with a low DLCO. He had f/u with Dr. Gilles Chiquito in the [facility] who felt his symptoms were multifactorial. Unclear why symptom onset so quick   He presents today for regular follow-up. Feels good. Just graduated [facility] and about to join the [employer]. Very active around the yard. NHo CP or SOB. Fluid stable on torsemide. Takes metolazone about 2x/month. Weight up 3-5 pounds. Doesn't think it is fluid.   Echo reviewed today LVEF 55-60% RV moderately dilated mild HK. RVSP 62mHG   Labs 11/21/14 K 3.8, Cr 1.41 Labs  12/05/14  K 4.0 1.15 Labs  01/08/15  K 4.0 1.36    PFTs (7/16) FEV1 1.45 L (45%) FVC 1.77 L (40%) DLCO 46%  RHC 4/16 RA = 18 RV = 72/4/17 PA = 76/27 (46) PCW =  21 Fick cardiac output/index (using PA sat) = 9.2/4.45 Thermo CO/CI = 10.0/4.87 PVR = 2.2 WU Fick cardiac output/index (using high SVC sat) = 5.2/2.5 Pulse-ox saturation = 89%  High SVC sat = 54% Low SVC sat = 81% (at SVC/RA junction) RA sat = 68% RV sat = 66% PA sat = 68%, 69% IVC sat =56%   ECGO 1/17: EF 60%. RV dilated with moderately reduced systolic function. RVSP 70. + bubble.  VQ/CT negative for PE   TEE 10/15 small PFO  Ab u/s 6/16 liver normal + ascites. Medico renal kidney disease.  Past Medical History  Diagnosis Date  . Hx of adenomatous colonic polyps   . Diabetes mellitus type II   . Hypertension   . Diverticulosis 2001  . Hyperlipidemia   . Persistent atrial fibrillation ([place]     a. s/p MAZE 04/2013 in setting of CABG. b. Amio stopped in 10/2013 after flutter ablation.  . Obstructive sleep apnea     compliant with CPAP  . Chronic diastolic congestive heart failure ([place]   . Hypertensive cardiomyopathy ([place]   . History of cardioversion     x3 (years uncertain)  . H/O hiatal hernia   . Basal cell carcinoma   . Adrenal adenoma   . CAD (coronary artery  disease)     a. 04/2013 CABG x 2: LIMA to LAD, SVG to RI, EVH via R thigh.  . S/P Maze operation for atrial fibrillation     a. 04/2013: Complete bilateral atrial lesion set using cryothermy and bipolar radiofrequency ablation with clipping of LA appendage (@ time of CABG)  . GERD (gastroesophageal reflux disease)   . Depression   . Anxiety   . DJD (degenerative joint disease)   . Atypical atrial flutter (HCC) 8/15, 10/15    a. DCCV 08/2013. b. s/p RFA 10/2013.  . Respiratory failure (HCC)     a. Hypoxia 10/2013 - required supp O2 as inpatient, did not require it at discharge.  . Pleural effusion, left     a. s/p thoracentesis 05/2013.  . PFO (patent foramen ovale)     a. Small PFO by TEE 10/2013.  . 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 S/P Maze operation for atrial fibrillation 04/05/2013    Complete bilateral atrial lesion set using cryothermy and bipolar radiofrequency ablation with clipping of LA appendage via median sternotomy approach     Current Outpatient Prescriptions  Medication Sig Dispense Refill  . ALPRAZolam (XANAX) 1 MG tablet Take 0.5 mg by mouth at bedtime as needed for sleep. Reported on 01/10/2015    . atorvastatin (LIPITOR) 40 MG tablet TAKE 1 TABLET BY MOUTH EVERY DAY 30 tablet 1  . B Complex Vitamins (VITAMIN B COMPLEX PO) Take 1 tablet by mouth daily.    . Blood Glucose Monitoring Suppl (ACCU-CHEK AVIVA PLUS) w/Device KIT Check blood sugar 1 time daily-DX-E11.29 1 kit 0  . carvedilol (COREG) 6.25 MG tablet TAKE 1 TABLET (6.25 MG TOTAL) BY MOUTH 2 (TWO) TIMES DAILY. 60 tablet 6  . cholecalciferol (VITAMIN D) 1000 UNITS tablet Take 4,000-5,000 Units by mouth See admin instructions. Take 4 tablets four times a week and take 5 tablets three times a week    . diphenhydrAMINE (BENADRYL) 25 MG tablet Take 25 mg by mouth at bedtime as needed for allergies.     . Dulaglutide (TRULICITY) 0.75 MG/0.5ML SOPN Inject 1 pen into the skin once a week. 0.5 mL 3  . fluticasone (FLONASE) 50 MCG/ACT nasal spray Place into both nostrils daily.    . glimepiride (AMARYL) 4 MG tablet Take 4 mg by mouth 2 (two) times daily.    . glucose blood (ACCU-CHEK AVIVA PLUS) test strip Check blood glucose 3 xdaily.   DX-E11.29 - fluctuating glucoses - uncontrolled Diabetes 300 each 12  . loratadine (CLARITIN) 10 MG tablet Take 10 mg by mouth daily.    . metFORMIN (GLUCOPHAGE-XR) 500 MG 24 hr tablet Take 2 tablets (1,000 mg total) by mouth 2 (two) times daily. 360 tablet 4  . metolazone (ZAROXOLYN) 5 MG tablet Take 5 mg by mouth daily as needed (weight gain and edema).    . Multiple Vitamin (MULTIVITAMIN WITH MINERALS) TABS tablet Take 1 tablet by mouth daily.    . nystatin (MYCOSTATIN) 100000  UNIT/ML suspension Please use 4-6 mL QID swish and gargle for as long as possible and then spit out. 60 mL 0  . potassium chloride SA (K-DUR,KLOR-CON) 20 MEQ tablet Take 20 mEq by mouth 2 (two) times daily.    . ranitidine (ZANTAC) 150 MG tablet Take 150 mg by mouth daily as needed for heartburn.     . sertraline (ZOLOFT) 100 MG tablet TAKE   1 TABLET BY MOUTH DAILY 30 tablet 11  . torsemide (DEMADEX) 100 MG tablet Take 100 mg in AM and 50 mg in PM if needed for weight gain    . valsartan (DIOVAN) 320 MG tablet Take 160 mg by mouth daily.    . warfarin (COUMADIN) 5 MG tablet Take 10 mg by mouth daily.     No current facility-administered medications for this encounter.    Allergies  Allergen Reactions  . Sunflower Seed [Sunflower Oil] Swelling and Other (See Comments)    Tongue and lip swelling  . Horse-Derived Products Other (See Comments)    Per allergy skin test  . Tetanus Toxoids Other (See Comments)    Per allergy skin test      Social History   Social History  . Marital Status: Married    Spouse Name: N/A  . Number of Children: 1  . Years of Education: N/A   Occupational History  . retired pharmacist    Social History Main Topics  . Smoking status: Former Smoker -- 4.00 packs/day for 25 years    Types: Cigarettes    Quit date: 01/05/1981  . Smokeless tobacco: Never Used  . Alcohol Use: 0.6 oz/week    1 Shots of liquor per week     Comment: 1-5 drinks per week  . Drug Use: No  . Sexual Activity: Not on file   Other Topics Concern  . Not on file   Social History Narrative   Daily caffeine-yes   Patient gets regular exercise.   Pt lives in [place] with spouse.  Retired pharmacist.                                                                                                                                                                                                                                                                                                                                                                                                                                                                                                                                                                                                                                                                                                                                                                                                                                                                                                                                                                                                                                                                                                                                                 Family History  Problem Relation Age of Onset  . Colon cancer Mother     Family History/Uncle   . Colon polyps Mother     Family History  . Atrial fibrillation Mother   . Hypertension Mother   . Colon polyps Sister     Family history  . Diabetes Maternal Uncle   . Stroke Paternal Uncle   . Dementia Father     Danley Danker Vitals:   05/07/15 0916  BP: 118/62  Pulse: 74  Weight: 191 lb 4 oz (86.75 kg)  SpO2: 98%    PHYSICAL EXAM: General: Looks great.  No resp difficulty HEENT: normal Neck: supple. JVP 7. Carotids 2+ bilat; no bruits. No thyromegaly or nodule noted Cor: PMI nondisplaced. RRR. 2/6 TR Lungs: CTAB, normal effort Abdomen: soft, NT, ND, no HSM noted. No bruits or masses. +BS Extremities: no cyanosis, clubbing, rash, edema Neuro: alert & orientedx3, cranial nerves grossly intact. moves all 4 extremities w/o difficulty. Affect pleasant  ASSESSMENT  1. 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 RV failure 3. Pulmonary HTN 4. 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 DM2 6. CAD s/p CABG 7. AF s/p Maze   --on coumadin 8. AFL s/p ablation  10/15 9. CKD, stage 3 10. HTN  PLAN  Looks great.  Much improved. Now euvolemic. Will have labs soon with Dr. Melford Aase. Echo from 1/17 reviewed personally and PA pressures down 50% from previous. RV dilation expected due to high R-side flow from anomalous PV. Will continue current regimen. Remains on coumadin for PAF has tried Eliquis in past but had too much bleeding. Encouraged him to join Y now that CR is complete.     See back 6 months.   Eleaner Dibartolo,MD 9:25 AM

## 2015-05-07 NOTE — Patient Instructions (Signed)
We will contact you in 6 months to schedule your next appointment.  

## 2015-05-08 ENCOUNTER — Telehealth (HOSPITAL_COMMUNITY): Payer: Self-pay

## 2015-05-08 NOTE — Telephone Encounter (Signed)
Patient called CHF clinic triage line to ask for discontinuation of oxygen and supplies from [facility]. Patient states he has not used it for months and per [name] this was ok to go ahead and discontinue. Order sent to [facility] via fax # [phone]  Renee Pain

## 2015-05-09 ENCOUNTER — Encounter (HOSPITAL_COMMUNITY): Payer: Medicare Other

## 2015-05-11 DIAGNOSIS — I480 Paroxysmal atrial fibrillation: Secondary | ICD-10-CM | POA: Diagnosis not present

## 2015-05-11 DIAGNOSIS — I2510 Atherosclerotic heart disease of native coronary artery without angina pectoris: Secondary | ICD-10-CM | POA: Diagnosis not present

## 2015-05-12 ENCOUNTER — Other Ambulatory Visit: Payer: Self-pay | Admitting: Internal Medicine

## 2015-05-14 ENCOUNTER — Encounter (HOSPITAL_COMMUNITY): Payer: Medicare Other

## 2015-05-16 ENCOUNTER — Encounter (HOSPITAL_COMMUNITY): Payer: Medicare Other

## 2015-05-21 ENCOUNTER — Encounter (HOSPITAL_COMMUNITY): Payer: Medicare Other

## 2015-05-22 ENCOUNTER — Encounter: Payer: Self-pay | Admitting: *Deleted

## 2015-05-22 ENCOUNTER — Other Ambulatory Visit: Payer: Self-pay | Admitting: *Deleted

## 2015-05-22 NOTE — Patient Outreach (Signed)
[facility] [facility]) Care Management  04/16/2015  [name] [birth year] [id number]   RN Health Coach attempted  Follow up outreach call to patient.  Patient was unavailable. HIPPA compliance voicemail message was left with return callback number.    [place] [phone]

## 2015-05-22 NOTE — Patient Outreach (Signed)
[facility] [facility]) Care Management  [place]  05/22/2015   GRIFFIN GERRARD 1940-08-07 [id number]  Subjective: RN Health Coach telephone call to patient.  Hipaa compliance verified.Per patient He is feeling the best he has in a long time. Patient blood sugar today is 140. Per patient it has not went over 200. He has not had any symptoms of hypoglycemia since our last outreach talk,  Per patient he is exercising regular at the [facility]. When he is not exercising at the [facility] he is walking. Patient knows the symptoms and action plan for hyper and hypoglycemia. Patient has a Dr appointment on 05/27/2015 and will get his A1C checked then. Per patient he is doing pretty good with his snacks. Patient agrees to follow up outreach.    Objective:   Encounter Medications:  Outpatient Encounter Prescriptions as of 05/22/2015  Medication Sig  . ALPRAZolam (XANAX) 1 MG tablet Take 0.5 mg by mouth at bedtime as needed for sleep. Reported on 01/10/2015  . atorvastatin (LIPITOR) 40 MG tablet TAKE 1 TABLET BY MOUTH EVERY DAY  . B Complex Vitamins (VITAMIN B COMPLEX PO) Take 1 tablet by mouth daily.  [name] BAYER MICROLET LANCETS lancets Test once a day  . Blood Glucose Monitoring Suppl (ACCU-CHEK AVIVA PLUS) w/Device KIT Check blood sugar 1 time daily-DX-E11.29  . carvedilol (COREG) 6.25 MG tablet TAKE 1 TABLET (6.25 MG TOTAL) BY MOUTH 2 (TWO) TIMES DAILY.  . cholecalciferol (VITAMIN D) 1000 UNITS tablet Take 4,000-5,000 Units by mouth See admin instructions. Take 4 tablets four times a week and take 5 tablets three times a week  . diphenhydrAMINE (BENADRYL) 25 MG tablet Take 25 mg by mouth at bedtime as needed for allergies.   . Dulaglutide (TRULICITY) 3.23 FT/7.3UK SOPN Inject 1 pen into the skin once a week.  . fluticasone (FLONASE) 50 MCG/ACT nasal spray Place into both nostrils daily.  [name] glimepiride (AMARYL) 4 MG tablet Take 4 mg by mouth 2 (two) times daily.  [name] glucose blood (ACCU-CHEK AVIVA  PLUS) test strip Check blood glucose 3 xdaily.   DX-E11.29 - fluctuating glucoses - uncontrolled Diabetes  . loratadine (CLARITIN) 10 MG tablet Take 10 mg by mouth daily.  . metFORMIN (GLUCOPHAGE-XR) 500 MG 24 hr tablet Take 2 tablets (1,000 mg total) by mouth 2 (two) times daily.  . metolazone (ZAROXOLYN) 5 MG tablet Take 5 mg by mouth daily as needed (weight gain and edema).  . Multiple Vitamin (MULTIVITAMIN WITH MINERALS) TABS tablet Take 1 tablet by mouth daily.  [name] nystatin (MYCOSTATIN) 100000 UNIT/ML suspension Please use 4-6 mL QID swish and gargle for as long as possible and then spit out.  . potassium chloride SA (K-DUR,KLOR-CON) 20 MEQ tablet Take 20 mEq by mouth 2 (two) times daily.  . ranitidine (ZANTAC) 150 MG tablet Take 150 mg by mouth daily as needed for heartburn.   . sertraline (ZOLOFT) 100 MG tablet TAKE 1 TABLET BY MOUTH DAILY  . torsemide (DEMADEX) 100 MG tablet Take 100 mg in AM and 50 mg in PM if needed for weight gain  . valsartan (DIOVAN) 320 MG tablet Take 160 mg by mouth daily.  [name] warfarin (COUMADIN) 5 MG tablet Take 10 mg by mouth daily.   No facility-administered encounter medications on file as of 05/22/2015.    Functional Status:  In your present state of health, do you have any difficulty performing the following activities: 05/22/2015 03/18/2015  Hearing? N N  Vision? N N  Difficulty concentrating  or making decisions? N N  Walking or climbing stairs? Y Y  Dressing or bathing? N N  Doing errands, shopping? N N  Preparing Food and eating ? N N  Using the Toilet? N N  In the past six months, have you accidently leaked urine? N N  Do you have problems with loss of bowel control? N N  Managing your Medications? N N  Managing your Finances? N N  Housekeeping or managing your Housekeeping? N N    Fall/Depression Screening: PHQ 2/9 Scores 05/22/2015 05/02/2015 03/18/2015 02/08/2015 02/01/2015 01/18/2015 01/08/2015  PHQ - 2 Score 0 0 0 0 0 0 0   THN CM Care Plan  Problem One        Most Recent Value   Care Plan Problem One  Knowlwdge deficit in self management of Diabetes   Role Documenting the Problem One  Health Coach   Care Plan for Problem One  Active   THN Long Term Goal (31-90 days)  Patient will show a decrease in A1C by one point in 90 days   THN Long Term Goal Start Date  05/22/15 [patient goes to Dr on 05/27/2015 ]   Interventions for Problem One Long Term Goal  RN sent educational material on your goal of A1C, How the A1C helps , The A1C test.  Controlling your blood sugar, RN will follow up with a month for teachback and discussion   THN CM Short Term Goal #1 (0-30 days)  Patient will be able to verbalize healthy snacks within the 30 days   THN CM Short Term Goal #1 Start Date  05/22/15 [Patient needs encouracgement that you do have to alter your diet]   Interventions for Short Term Goal #1  RN sent patient a list of healthy snacks. RN sent patient infomation on Healthy meal plan, RN will send educational material on creating a plan   THN CM Short Term Goal #2 (0-30 days)  Patient will be able to verbalize hypo and hyperglycemia symptoms and action plan within the next 30 days   THN CM Short Term Goal #2 Start Date  05/22/15   Interventions for Short Term Goal #2  RN discussed with patient signs and symptoms of hypo and hyper glycemia, RN sent patient a picture chart along with written EMMI information . RN will follow up within a month for discussionm.   THN CM Short Term Goal #3 (0-30 days)  Patient will be able to verbalize blood sugar range under 200 within the next 30 days   THN CM Short Term Goal #3 Start Date  05/22/15 [patient stated the highes so foar is 180 but will continue to monitor]   Interventions for Short Tern Goal #3  RN sent patient infomation on controlling your diabetes. RN  discussed the range needed for the A1C to decrease to the patient desired level. RN will follow up with discussion. RN sent patient a calendar book for  documentation on blood sugars     Assessment:  Patient will continue to  benefit from [employer] telephonic outreach for education and support for diabetes self management.  Plan:  RN will send patient a 2017 Calendar book for documentation RN will send patient educational material on Creating a Healthy Meal Plan RN will send patient educational material on Why High Blood Glucose is a Problem RN will send patient educational material on Track Your Readings and what they mean RN will follow up within 30 days tor discussion and teach back  [place] Care Management [phone]

## 2015-05-23 ENCOUNTER — Encounter (HOSPITAL_COMMUNITY): Payer: Medicare Other

## 2015-05-27 ENCOUNTER — Encounter: Payer: Self-pay | Admitting: Internal Medicine

## 2015-05-27 ENCOUNTER — Ambulatory Visit (INDEPENDENT_AMBULATORY_CARE_PROVIDER_SITE_OTHER): Payer: Medicare Other | Admitting: Internal Medicine

## 2015-05-27 VITALS — BP 136/68 | HR 62 | Temp 98.2°F | Resp 16 | Ht 71.0 in | Wt 186.0 lb

## 2015-05-27 DIAGNOSIS — Z79899 Other long term (current) drug therapy: Secondary | ICD-10-CM

## 2015-05-27 DIAGNOSIS — E1129 Type 2 diabetes mellitus with other diabetic kidney complication: Secondary | ICD-10-CM | POA: Diagnosis not present

## 2015-05-27 DIAGNOSIS — N183 Chronic kidney disease, stage 3 (moderate): Secondary | ICD-10-CM

## 2015-05-27 DIAGNOSIS — E782 Mixed hyperlipidemia: Secondary | ICD-10-CM | POA: Diagnosis not present

## 2015-05-27 DIAGNOSIS — I480 Paroxysmal atrial fibrillation: Secondary | ICD-10-CM

## 2015-05-27 DIAGNOSIS — Z7901 Long term (current) use of anticoagulants: Secondary | ICD-10-CM

## 2015-05-27 DIAGNOSIS — E559 Vitamin D deficiency, unspecified: Secondary | ICD-10-CM | POA: Diagnosis not present

## 2015-05-27 DIAGNOSIS — I10 Essential (primary) hypertension: Secondary | ICD-10-CM

## 2015-05-27 DIAGNOSIS — E1122 Type 2 diabetes mellitus with diabetic chronic kidney disease: Secondary | ICD-10-CM | POA: Diagnosis not present

## 2015-05-27 LAB — PROTIME-INR
INR: 1.97 — ABNORMAL HIGH (ref <1.50)
PROTHROMBIN TIME: 22.7 s — AB (ref 11.6–15.2)

## 2015-05-27 LAB — BASIC METABOLIC PANEL WITH GFR
BUN: 34 mg/dL — AB (ref 7–25)
CALCIUM: 9.5 mg/dL (ref 8.6–10.3)
CO2: 34 mmol/L — AB (ref 20–31)
CREATININE: 1.59 mg/dL — AB (ref 0.70–1.18)
Chloride: 95 mmol/L — ABNORMAL LOW (ref 98–110)
GFR, Est African American: 48 mL/min — ABNORMAL LOW (ref >=60)
GFR, Est Non African American: 42 mL/min — ABNORMAL LOW (ref >=60)
GLUCOSE: 237 mg/dL — AB (ref 65–99)
Potassium: 3.3 mmol/L — ABNORMAL LOW (ref 3.5–5.3)
Sodium: 141 mmol/L (ref 135–146)

## 2015-05-27 LAB — CBC WITH DIFFERENTIAL/PLATELET
BASOS ABS: 0 {cells}/uL (ref 0–200)
Basophils Relative: 0 %
Eosinophils Absolute: 146 cells/uL (ref 15–500)
Eosinophils Relative: 2 %
HEMATOCRIT: 38.7 % (ref 38.5–50.0)
Hemoglobin: 13.1 g/dL — ABNORMAL LOW (ref 13.2–17.1)
LYMPHS ABS: 949 {cells}/uL (ref 850–3900)
Lymphocytes Relative: 13 %
MCH: 31.6 pg (ref 27.0–33.0)
MCHC: 33.9 g/dL (ref 32.0–36.0)
MCV: 93.3 fL (ref 80.0–100.0)
MONO ABS: 584 {cells}/uL (ref 200–950)
MPV: 8.4 fL (ref 7.5–12.5)
Monocytes Relative: 8 %
NEUTROS PCT: 77 %
Neutro Abs: 5621 cells/uL (ref 1500–7800)
Platelets: 169 10*3/uL (ref 140–400)
RBC: 4.15 MIL/uL — ABNORMAL LOW (ref 4.20–5.80)
RDW: 14.6 % (ref 11.0–15.0)
WBC: 7.3 10*3/uL (ref 3.8–10.8)

## 2015-05-27 LAB — HEPATIC FUNCTION PANEL
ALT: 23 U/L (ref 9–46)
AST: 19 U/L (ref 10–35)
Albumin: 4.6 g/dL (ref 3.6–5.1)
Alkaline Phosphatase: 70 U/L (ref 40–115)
BILIRUBIN INDIRECT: 0.6 mg/dL (ref 0.2–1.2)
Bilirubin, Direct: 0.2 mg/dL (ref <=0.2)
TOTAL PROTEIN: 7.6 g/dL (ref 6.1–8.1)
Total Bilirubin: 0.8 mg/dL (ref 0.2–1.2)

## 2015-05-27 LAB — TSH: TSH: 1.69 mIU/L (ref 0.40–4.50)

## 2015-05-27 LAB — LIPID PANEL
CHOLESTEROL: 137 mg/dL (ref 125–200)
HDL: 26 mg/dL — ABNORMAL LOW (ref >=40)
LDL Cholesterol: 67 mg/dL (ref <130)
Total CHOL/HDL Ratio: 5.3 Ratio — ABNORMAL HIGH (ref <=5.0)
Triglycerides: 219 mg/dL — ABNORMAL HIGH (ref <150)
VLDL: 44 mg/dL — AB (ref <30)

## 2015-05-27 LAB — URIC ACID: Uric Acid, Serum: 10.2 mg/dL — ABNORMAL HIGH (ref 4.0–7.8)

## 2015-05-27 LAB — MAGNESIUM: MAGNESIUM: 2.2 mg/dL (ref 1.5–2.5)

## 2015-05-27 NOTE — Patient Instructions (Signed)

## 2015-05-27 NOTE — Progress Notes (Signed)
Patient ID: [name], male   DOB: 03-11-1940, 75 y.o.   MRN: [id number]  [facility] ADULT & ADOLESCENT INTERNAL MEDICINE                       [name], M.D.        Uvaldo Bristle. Silverio Lay, P.A.-C       Starlyn Skeans, P.A.-C   [facility]                [address] [place] [id number] Telephone [phone] Telefax [phone] ________________________________________________________________________   This very nice 75 y.o. 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 Patient is also on CPAP for OSA.   Patient is treated for HTN (1980) & BP has been controlled and today's BP: 136/68 mmHg. Patient is s/pCABG & MAZE in 2015  And also RFA for AFL. 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 Patient denies any current complaints of any cardiac type chest pain, palpitations, dyspnea/orthopnea/PND, dizziness, claudication, or dependent edema. He reports O2 sats range betw 93-97% on Rm Air.    Hyperlipidemia is controlled with diet & meds. Patient denies myalgias or other med SE's. Last Lipids were  Cholesterol 161; HDL 25*; LDL 91; Triglycerides 224 on 01/08/2015.   Also, the patient has history of T2_NIDDM (1995) and has had no symptoms of reactive hypoglycemia, diabetic polys, paresthesias or visual blurring.  He reports CBG's range betw 120-180 mg%.  Last A1c was 8.5% on 01/08/2015.     Further, the patient also has history of Vitamin D Deficiency of "46" in 2008 and supplements vitamin D without any suspected side-effects. Last vitamin D was64 on  01/08/2015.   Medication Sig  . ALPRAZolam  1 MG tablet Take 0.5 mg by mouth at bedtime  as needed for sleep. Reported on 01/10/2015  . atorvastatin  40 MG tablet TAKE 1 TABLET BY MOUTH EVERY DAY  . VITAMIN B COMPLEX  Take 1 tablet by mouth daily.  . carvedilol (COREG) 6.25 MG tablet TAKE 1 TABLET (6.25 MG TOTAL) BY MOUTH 2 (TWO) TIMES DAILY.  [name] VITAMIN D 1000 UNITS tablet Take 4,000-5,000 Units by mouth See admin instructions. Take 4 tablets four times a week and take 5 tablets three times a week  . diphenhydrAMINE  25 MG tablet Take 25 mg by mouth at bedtime as needed for allergies.   . TRULICITY [id number]  Inject 1 pen into the skin once a week.  [name] FLONASE nasal spray Place into both nostrils daily.  [name] glimepiride  4 MG tablet Take 4 mg by mouth 2 (two) times daily.  [name] loratadine (CLARITIN) 10 MG tablet Take 10 mg by mouth daily.  . metFORMIN -XR 500 MG  Take 2 tablets (1,000 mg total) by mouth 2 (two) times daily.  . metolazone  5 MG tablet Take 5 mg by mouth daily as needed (weight gain and edema).  . MultiVit  w/ MINERALS  Take 1 tablet by mouth daily.  . potassium chloride 20 MEQ tablet Take 20 mEq by  mouth 2 (two) times daily.  . ranitidine  150 MG tablet Take 150 mg by mouth daily as needed for heartburn.   . sertraline  100 MG tablet TAKE 1 TABLET BY MOUTH DAILY  . torsemide  100 MG tablet Take 100 mg in AM and 50 mg in PM if needed for weight gain  . valsartan  320 MG tablet Take 160 mg by mouth daily.  [name] warfarin  5 MG tablet Take 10 mg by mouth daily.   Allergies  Allergen Reactions  . Sunflower Seed [Sunflower Oil] Swelling and Other (See Comments)    Tongue and lip swelling  . Horse-Derived Products Other (See Comments)    Per allergy skin test  . Tetanus Toxoids Other (See Comments)    Per allergy skin test   PMHx:   Past Medical History  Diagnosis Date  . Hx of adenomatous colonic polyps   . Diabetes mellitus type II   . Hypertension   . Diverticulosis 2001  . Hyperlipidemia   . Persistent atrial fibrillation ([place])     a. s/p MAZE 04/2013 in  setting of CABG. b. Amio stopped in 10/2013 after flutter ablation.  . Obstructive sleep apnea     compliant with CPAP  . Chronic diastolic congestive heart failure ([place])   . Hypertensive cardiomyopathy ([place])   . History of cardioversion     x3 (years uncertain)  . H/O hiatal hernia   . Basal cell carcinoma   . Adrenal adenoma   . CAD (coronary artery disease)     a. 04/2013 CABG x 2: LIMA to LAD, SVG to RI, EVH via R thigh.  . S/P Maze operation for atrial fibrillation     a. 04/2013: Complete bilateral atrial lesion set using cryothermy and bipolar radiofrequency ablation with clipping of LA appendage (@ time of CABG)  . GERD (gastroesophageal reflux disease)   . Depression   . Anxiety   . DJD (degenerative joint disease)   . Atypical atrial flutter ([place]) 8/15, 10/15    a. DCCV 08/2013. b. s/p RFA 10/2013.  [name] Respiratory failure ([place])     a. Hypoxia 10/2013 - required supp O2 as inpatient, did not require it at discharge.  . Pleural effusion, left     a. s/p thoracentesis 05/2013.  [name] PFO (patent foramen ovale)     a. Small PFO by TEE 10/2013.  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 S/P Maze operation for atrial fibrillation 04/05/2013    Complete bilateral atrial lesion set using cryothermy and bipolar radiofrequency ablation with clipping of LA appendage via median sternotomy approach    Immunization History  Administered Date(s) Administered  . Influenza Split 10/25/2012  . Influenza, High Dose Seasonal PF 09/26/2013, 09/27/2014  . Pneumococcal Conjugate-13 01/30/2014  . Pneumococcal Polysaccharide-23 10/20/2011  . Td 01/06/2000   Past Surgical History  Procedure Laterality Date  . Polinydal cyst      Removed  . Great toe arthrodesis, interphalangeal joint      Right foot  . Retina repair-right    . Cataract extraction      bilateral  . Basal cell carcinoma  excision      x3 on face  . Polypectomy    . Cardiac catheterization      myocardial bridge but no cad  . Coronary artery bypass graft N/A 04/05/2013    Procedure: CORONARY ARTERY BYPASS  GRAFTING (CABG) TIMES TWO USING LEFT INTERNAL MAMMARY ARTERY AND RIGHT SAPHENOUS LEG VEIN HARVESTED ENDOSCOPICALLY;  Surgeon: Rexene Alberts, MD;  Location: [place];  Service: Open Heart Surgery;  Laterality: N/A;  . Maze N/A 04/05/2013    Procedure: MAZE;  Surgeon: Rexene Alberts, MD;  Location: [place];  Service: Open Heart Surgery;  Laterality: N/A;  . Intraoperative transesophageal echocardiogram N/A 04/05/2013    Procedure: INTRAOPERATIVE TRANSESOPHAGEAL ECHOCARDIOGRAM;  Surgeon: Rexene Alberts, MD;  Location: [place];  Service: Open Heart Surgery;  Laterality: N/A;  . Tee without cardioversion N/A 08/23/2013    Procedure: TRANSESOPHAGEAL ECHOCARDIOGRAM (TEE);  Surgeon: Sanda Klein, MD;  Location: [place];  Service: Cardiovascular;  Laterality: N/A;  . Cardioversion N/A 08/23/2013    Procedure: CARDIOVERSION;  Surgeon: Sanda Klein, MD;  Location: [facility] ENDOSCOPY;  Service: Cardiovascular;  Laterality: N/A;  . Tee without cardioversion N/A 10/26/2013    Procedure: TRANSESOPHAGEAL ECHOCARDIOGRAM (TEE);  Surgeon: Sueanne Margarita, MD;  Location: [facility] ENDOSCOPY;  Service: Cardiovascular;  Laterality: N/A;  . Left heart catheterization with coronary angiogram N/A 03/07/2013    Procedure: LEFT HEART CATHETERIZATION WITH CORONARY ANGIOGRAM;  Surgeon: Burnell Blanks, MD;  Location: [facility] CATH LAB;  Service: Cardiovascular;  Laterality: N/A;  . Atrial fibrillation ablation N/A 10/26/2013    Procedure: ATRIAL FIBRILLATION ABLATION;  Surgeon: Coralyn Mark, MD;  Location: [place] CATH LAB;  Service: Cardiovascular;  Laterality: N/A;  . Carpometacarpel suspension plasty Left 02/14/2014    Procedure: CARPOMETACARPEL ([place]) SUSPENSIONPLASTY THUMB  WITH  ABDUCTOR POLLICIS LONGUS TRANSFER AND STENOSING TENOSYNOVITIS RELEASE LEFT  WRIST;  Surgeon: Charlotte Crumb, MD;  Location: [place];  Service: Orthopedics;  Laterality: Left;  . Trapezium resection    . Right heart catheterization N/A 05/03/2014    Procedure: RIGHT HEART CATH;  Surgeon: Jolaine Artist, MD;  Location: [facility] CATH LAB;  Service: Cardiovascular;  Laterality: N/A;  . Tee without cardioversion N/A 06/05/2014    Procedure: TRANSESOPHAGEAL ECHOCARDIOGRAM (TEE);  Surgeon: Thayer Headings, MD;  Location: [facility] ENDOSCOPY;  Service: Cardiovascular;  Laterality: N/A;   FHx:    Reviewed / unchanged  SHx:    Reviewed / unchanged  Systems Review:  Constitutional: Denies fever, chills, wt changes, headaches, insomnia, fatigue, night sweats, change in appetite. Eyes: Denies redness, blurred vision, diplopia, discharge, itchy, watery eyes.  ENT: Denies discharge, congestion, post nasal drip, epistaxis, sore throat, earache, hearing loss, dental pain, tinnitus, vertigo, sinus pain, snoring.  CV: Denies chest pain, palpitations, irregular heartbeat, syncope, dyspnea, diaphoresis, orthopnea, PND, claudication or edema. Respiratory: denies cough, dyspnea, DOE, pleurisy, hoarseness, laryngitis, wheezing.  Gastrointestinal: Denies dysphagia, odynophagia, heartburn, reflux, water brash, abdominal pain or cramps, nausea, vomiting, bloating, diarrhea, constipation, hematemesis, melena, hematochezia  or hemorrhoids. Genitourinary: Denies dysuria, frequency, urgency, nocturia, hesitancy, discharge, hematuria or flank pain. Musculoskeletal: Denies arthralgias, myalgias, stiffness, jt. swelling, pain, limping or strain/sprain.  Skin: Denies pruritus, rash, hives, warts, acne, eczema or change in skin lesion(s). Neuro: No weakness, tremor, incoordination, spasms, paresthesia or pain. Psychiatric: Denies confusion, memory loss or sensory loss. Endo: Denies change in weight, skin or hair change.  Heme/Lymph: No excessive bleeding, bruising or enlarged lymph  nodes.  Physical Exam  BP 136/68   Pulse 62  Temp 98.2 F Resp 16  Ht 5\' 11"    Wt 186 lb   BMI 25.95   O2 sat 97% on Rm Air  Appears well nourished and in no distress. Eyes: PERRLA, EOMs, conjunctiva no swelling or  erythema. Sinuses: No frontal/maxillary tenderness ENT/Mouth: EAC's clear, TM's nl w/o erythema, bulging. Nares clear w/o erythema, swelling, exudates. Oropharynx clear without erythema or exudates. Oral hygiene is good. Tongue normal, non obstructing. Hearing intact.  Neck: Supple. Thyroid nl. Car 2+/2+ without bruits, nodes or JVD. Chest: Respirations nl with BS clear & equal w/o rales, rhonchi, wheezing or stridor.  Cor: Heart sounds normal w/ regular rate and rhythm without sig. murmurs, gallops, clicks, or rubs. Peripheral pulses normal and equal  With trace ankle edema.  Abdomen: Soft & bowel sounds normal. Non-tender w/o guarding, rebound, hernias, masses, or organomegaly.  Lymphatics: Unremarkable.  Musculoskeletal: Full ROM all peripheral extremities, joint stability, 5/5 strength, and normal gait.  Skin: Warm, dry without exposed rashes, lesions or ecchymosis apparent.  Neuro: Cranial nerves intact, reflexes equal bilaterally. Sensory-motor testing grossly intact. Tendon reflexes grossly intact.  Pysch: Alert & oriented x 3.  Insight and judgement nl & appropriate. No ideations.  Assessment and Plan:  1. Essential hypertension  - TSH  2. Hyperlipidemia  - Lipid panel - TSH  3. 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 Vitamin D deficiency  - VITAMIN D 25 Hydroxy  5. PAF (paroxysmal atrial fibrillation) ([place])   6. Medication management  - Uric acid - CBC with Differential/Platelet - BASIC METABOLIC PANEL WITH GFR - Hepatic function panel - Magnesium   Recommended regular exercise, BP monitoring, weight control, and discussed med and SE's. Recommended labs to assess and monitor clinical status. Further disposition  pending results of labs. Over 30 minutes of exam, counseling, chart review was performed

## 2015-05-28 ENCOUNTER — Encounter (HOSPITAL_COMMUNITY): Payer: Medicare Other

## 2015-05-28 LAB — INSULIN, RANDOM: Insulin: 9.3 u[IU]/mL (ref 2.0–19.6)

## 2015-05-28 LAB — VITAMIN D 25 HYDROXY (VIT D DEFICIENCY, FRACTURES): Vit D, 25-Hydroxy: 64 ng/mL (ref 30–100)

## 2015-05-28 LAB — HEMOGLOBIN A1C
HEMOGLOBIN A1C: 8.3 % — AB (ref <5.7)
MEAN PLASMA GLUCOSE: 192 mg/dL

## 2015-05-30 ENCOUNTER — Encounter (HOSPITAL_COMMUNITY): Payer: Medicare Other

## 2015-06-02 ENCOUNTER — Other Ambulatory Visit: Payer: Self-pay | Admitting: Cardiovascular Disease

## 2015-06-04 ENCOUNTER — Encounter (HOSPITAL_COMMUNITY): Payer: Medicare Other

## 2015-06-04 NOTE — Telephone Encounter (Signed)
valsartan (DIOVAN) 320 MG tablet  Medication   Ordering: Harvie Junior, CMA  Authorizing: Historical Provider, MD   Date: 05/07/2015  Department: [place]      Order Providers    Documenting Provider Encounter Provider   Historical Provider, MD Jolaine Artist, MD    Medication Detail      Disp Refills Start End     valsartan (DIOVAN) 320 MG tablet        Sig - Route: Take 160 mg by mouth daily. - Oral    Class: Historical Med    FILLED AT CHF, WILL ROUTE TO JASMINE

## 2015-06-06 ENCOUNTER — Encounter (HOSPITAL_COMMUNITY): Payer: Medicare Other

## 2015-06-11 ENCOUNTER — Encounter (HOSPITAL_COMMUNITY): Payer: Medicare Other

## 2015-06-14 ENCOUNTER — Encounter (INDEPENDENT_AMBULATORY_CARE_PROVIDER_SITE_OTHER): Payer: Medicare Other | Admitting: Ophthalmology

## 2015-06-14 DIAGNOSIS — H353112 Nonexudative age-related macular degeneration, right eye, intermediate dry stage: Secondary | ICD-10-CM

## 2015-06-14 DIAGNOSIS — H34832 Tributary (branch) retinal vein occlusion, left eye, with macular edema: Secondary | ICD-10-CM | POA: Diagnosis not present

## 2015-06-14 DIAGNOSIS — H43813 Vitreous degeneration, bilateral: Secondary | ICD-10-CM

## 2015-06-14 DIAGNOSIS — H35033 Hypertensive retinopathy, bilateral: Secondary | ICD-10-CM | POA: Diagnosis not present

## 2015-06-14 DIAGNOSIS — I10 Essential (primary) hypertension: Secondary | ICD-10-CM | POA: Diagnosis not present

## 2015-06-14 DIAGNOSIS — H353121 Nonexudative age-related macular degeneration, left eye, early dry stage: Secondary | ICD-10-CM

## 2015-06-24 ENCOUNTER — Encounter: Payer: Self-pay | Admitting: *Deleted

## 2015-06-24 ENCOUNTER — Other Ambulatory Visit: Payer: Self-pay | Admitting: *Deleted

## 2015-06-24 NOTE — Patient Outreach (Signed)
[facility]) Care Management  [place]  06/25/2015   [name] 11/08/1940 [id number]  Subjective:  RN Health Coach telephone call to patient.  [place] compliance verified. Per patient he is doing very good. Patient is exercising on a regular basis. Patient A1C was 8.5 now it is 8.3. Patient blood sugar today is 97. Per patient he has been started on trulicity and it is usually 120 or less. Patient has changed his diet significantly by eating more raw vegetables such as carrots, raw turnips. Per patient he does not eat much in the way of fried foods. He mostly grills and bakes. Patient stated he tries to eat low sodium foods. RN discussed with patient about foods low in salt and agreed to send him a picture chare and more information.  RN discussed with the patient about what the A1C meant with his numbers. Patient also stated that his cholesterol panel was up a little. RN discussed with patient about what the levels mean and will send the patient additional information. Patient agreed to follow up outreach calls.     Objective:   Encounter Medications:  Outpatient Encounter Prescriptions as of 06/24/2015  Medication Sig  . ALPRAZolam (XANAX) 1 MG tablet Take 0.5 mg by mouth at bedtime as needed for sleep. Reported on 01/10/2015  . atorvastatin (LIPITOR) 40 MG tablet TAKE 1 TABLET BY MOUTH EVERY DAY  . B Complex Vitamins (VITAMIN B COMPLEX PO) Take 1 tablet by mouth daily.  [name] BAYER MICROLET LANCETS lancets Test once a day  . Blood Glucose Monitoring Suppl (ACCU-CHEK AVIVA PLUS) w/Device KIT Check blood sugar 1 time daily-DX-E11.29  . carvedilol (COREG) 6.25 MG tablet TAKE 1 TABLET (6.25 MG TOTAL) BY MOUTH 2 (TWO) TIMES DAILY.  . cholecalciferol (VITAMIN D) 1000 UNITS tablet Take 4,000-5,000 Units by mouth See admin instructions. Take 4 tablets four times a week and take 5 tablets three times a week  . diphenhydrAMINE (BENADRYL) 25 MG tablet Take 25 mg by mouth at  bedtime as needed for allergies.   . Dulaglutide (TRULICITY) 5.40 GQ/6.7YP SOPN Inject 1 pen into the skin once a week.  . fluticasone (FLONASE) 50 MCG/ACT nasal spray Place into both nostrils daily.  [name] glimepiride (AMARYL) 4 MG tablet Take 4 mg by mouth 2 (two) times daily.  [name] glucose blood (ACCU-CHEK AVIVA PLUS) test strip Check blood glucose 3 xdaily.   DX-E11.29 - fluctuating glucoses - uncontrolled Diabetes  . loratadine (CLARITIN) 10 MG tablet Take 10 mg by mouth daily.  . metFORMIN (GLUCOPHAGE-XR) 500 MG 24 hr tablet Take 2 tablets (1,000 mg total) by mouth 2 (two) times daily.  . metolazone (ZAROXOLYN) 5 MG tablet Take 5 mg by mouth daily as needed (weight gain and edema).  . Multiple Vitamin (MULTIVITAMIN WITH MINERALS) TABS tablet Take 1 tablet by mouth daily.  . potassium chloride SA (K-DUR,KLOR-CON) 20 MEQ tablet Take 20 mEq by mouth 2 (two) times daily.  . ranitidine (ZANTAC) 150 MG tablet Take 150 mg by mouth daily as needed for heartburn.   . sertraline (ZOLOFT) 100 MG tablet TAKE 1 TABLET BY MOUTH DAILY  . torsemide (DEMADEX) 100 MG tablet Take 100 mg in AM and 50 mg in PM if needed for weight gain  . valsartan (DIOVAN) 320 MG tablet Take 160 mg by mouth daily.  . valsartan (DIOVAN) 320 MG tablet Take 0.5 tablets (160 mg total) by mouth daily.  [name] warfarin (COUMADIN) 5 MG tablet Take 10 mg by  mouth daily.   No facility-administered encounter medications on file as of 06/24/2015.    Functional Status:  In your present state of health, do you have any difficulty performing the following activities: 06/24/2015 05/27/2015  Hearing? N N  Vision? N N  Difficulty concentrating or making decisions? N N  Walking or climbing stairs? Y N  Dressing or bathing? N N  Doing errands, shopping? N N  Preparing Food and eating ? N -  Using the Toilet? N -  In the past six months, have you accidently leaked urine? - -  Do you have problems with loss of bowel control? N -  Managing your  Medications? - -  Managing your Finances? N -  Housekeeping or managing your Housekeeping? N -    Fall/Depression Screening: PHQ 2/9 Scores 06/24/2015 05/27/2015 05/22/2015 05/02/2015 03/18/2015 02/08/2015 02/01/2015  PHQ - 2 Score 0 0 0 0 0 0 0   THN CM Care Plan Problem One        Most Recent Value   Care Plan Problem One  Knowlwdge deficit in self management of Diabetes   Role Documenting the Problem One  [place] for Problem One  Active   THN Long Term Goal (31-90 days)  Patient will show a decrease in A1C by one point in 90 days   THN Long Term Goal Start Date  06/24/15 [not met ongoing. Patient A1c was 8.5 decreased to 8.3]   Interventions for Problem One Long Term Goal  RN sent educational material on your goal of A1C, How the A1C helps , The A1C test.  Controlling your blood sugar, Patient medicationa has been changed and patient feels it is controlling his blood sugars better. RN will follow up with a month for teachback and discussion   THN CM Short Term Goal #1 (0-30 days)  Patient will be able to verbalize healthy snacks within the 30 days   THN CM Short Term Goal #1 Start Date  06/24/15   Interventions for Short Term Goal #1  RN sent patient a list of healthy snacks. RN sent patient infomation on Healthy meal plan, RN will send educational material on creating a plan. RN discussed healthy foods and diet tips with patient   THN CM Short Term Goal #2 (0-30 days)  Patient will be able to verbalize hypo and hyperglycemia symptoms and action plan within the next 30 days   THN CM Short Term Goal #2 Start Date  06/24/15 [ongoing. Patient is not experiencing any symptoms so they  need to be reiviewed ]   Interventions for Short Term Goal #2  RN discussed with patient signs and symptoms of hypo and hyper glycemia, RN sent patient a picture chart along with written EMMI information . RN will follow up within a month for discussionm.   THN CM Short Term Goal #3 (0-30 days)  Patient  will be able to verbalize blood sugar range under 200 within the next 30 days   THN CM Short Term Goal #3 Start Date  06/24/15 [patient blood sugars are running 120 or less per patient stated]   [facility] CM Short Term Goal #3 Met Date  06/24/15   Interventions for Short Tern Goal #3  RN sent patient infomation on controlling your diabetes. RN  discussed the range needed for the A1C to decrease to the patient desired level. RN will follow up with discussion. RN sent patient a calendar book for documentation on blood sugars  THN CM Short Term Goal #4 (0-30 days)  Patient will be able to verbalize what his cholesterol level results mean within 30 days   THN CM Short Term Goal #4 Start Date  06/24/15   Interventions for Short Term Goal #4  RN sent patient EMMI information about cholesterol and lifestyle, Cholesterol testing and results. RN will follow up with discussion and teachback regarding his results.      Assessment:  Patient Hgb A1C came down from 8.5 to 8.3 Patient blood sugars are running 120 and less Patient is eating much healthier Patient will continue to  benefit from [place] telephonic outreach for education and support for diabetes self management.  Plan:  RN sent patient EMMI information on Cholesterol testing results and Cholesterol and lifestyle RN sent patient a picture food chart on foods high and low in sodium RN sent educational material on cooking with less salt, and a low sodium eating plan RN encouraged patient to keep blood sugars down  [place] [facility] [phone]

## 2015-07-05 ENCOUNTER — Ambulatory Visit: Payer: Self-pay | Admitting: Internal Medicine

## 2015-07-11 ENCOUNTER — Ambulatory Visit (INDEPENDENT_AMBULATORY_CARE_PROVIDER_SITE_OTHER): Payer: Medicare Other | Admitting: Internal Medicine

## 2015-07-11 ENCOUNTER — Encounter: Payer: Self-pay | Admitting: Internal Medicine

## 2015-07-11 VITALS — BP 108/60 | HR 78 | Temp 98.2°F | Resp 16 | Ht 71.0 in | Wt 188.0 lb

## 2015-07-11 DIAGNOSIS — Z7901 Long term (current) use of anticoagulants: Secondary | ICD-10-CM

## 2015-07-11 DIAGNOSIS — Z79899 Other long term (current) drug therapy: Secondary | ICD-10-CM | POA: Diagnosis not present

## 2015-07-11 LAB — PROTIME-INR
INR: 2.4 — ABNORMAL HIGH
Prothrombin Time: 24.3 s — ABNORMAL HIGH (ref 9.0–11.5)

## 2015-07-11 MED ORDER — ERYTHROMYCIN 5 MG/GM OP OINT: TOPICAL_OINTMENT | Freq: Three times a day (TID) | OPHTHALMIC | Status: AC

## 2015-07-11 NOTE — Progress Notes (Signed)
Assessment and Plan:   1. Long term current use of anticoagulant therapy -cont coumadin - Protime-INR  2. Medication management  - Hepatic function panel - BASIC METABOLIC PANEL WITH GFR     HPI 75 y.o.male presents for 1 month follow up of long term anticoagulations secondary to PFO. Patient reports that they have been doing well.  He notes that he has not missed any doses of his coumadin.  He has not had any bleeding issues.  He has not fallen recently.  male is taking their medication.  They are having difficulty with their medications.  They report no adverse reactions.      His blood sugars have decreased significantly.  He reports that he is doing well with the trulicity.    Past Medical History  Diagnosis Date  . Hx of adenomatous colonic polyps   . Diabetes mellitus type II   . Hypertension   . Diverticulosis 2001  . Hyperlipidemia   . Persistent atrial fibrillation ([place])     a. s/p MAZE 04/2013 in setting of CABG. b. Amio stopped in 10/2013 after flutter ablation.  . Obstructive sleep apnea     compliant with CPAP  . Chronic diastolic congestive heart failure ([place])   . Hypertensive cardiomyopathy ([place])   . History of cardioversion     x3 (years uncertain)  . H/O hiatal hernia   . Basal cell carcinoma   . Adrenal adenoma   . CAD (coronary artery disease)     a. 04/2013 CABG x 2: LIMA to LAD, SVG to RI, EVH via R thigh.  . S/P Maze operation for atrial fibrillation     a. 04/2013: Complete bilateral atrial lesion set using cryothermy and bipolar radiofrequency ablation with clipping of LA appendage (@ time of CABG)  . GERD (gastroesophageal reflux disease)   . Depression   . Anxiety   . DJD (degenerative joint disease)   . 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 DCCV 08/2013. b. s/p RFA 10/2013.  [name] Respiratory failure ([place])     a. Hypoxia 10/2013 - required supp O2 as inpatient, did not require it at discharge.  . Pleural effusion, left     a. s/p  thoracentesis 05/2013.  [name] PFO (patent foramen ovale)     a. Small PFO by TEE 10/2013.  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 S/P Maze operation for atrial fibrillation 04/05/2013    Complete bilateral atrial lesion set using cryothermy and bipolar radiofrequency ablation with clipping of LA appendage via median sternotomy approach      Allergies  Allergen Reactions  . Sunflower Seed [Sunflower Oil] Swelling and Other (See Comments)    Tongue and lip swelling  . Horse-Derived Products Other (See Comments)    Per allergy skin test  . Tetanus Toxoids Other (See Comments)    Per allergy skin test      Current Outpatient Prescriptions on File Prior to Visit  Medication Sig Dispense Refill  . ALPRAZolam (XANAX) 1 MG tablet Take 0.5 mg by mouth at bedtime as needed for sleep. Reported on 01/10/2015    . atorvastatin (LIPITOR) 40 MG tablet TAKE 1 TABLET BY MOUTH EVERY DAY 30 tablet 1  . B Complex Vitamins (VITAMIN B COMPLEX PO) Take 1 tablet by mouth daily.    [name] BAYER MICROLET LANCETS lancets Test once a day 100 each 3  .  Blood Glucose Monitoring Suppl (ACCU-CHEK AVIVA PLUS) w/Device KIT Check blood sugar 1 time daily-DX-E11.29 1 kit 0  . carvedilol (COREG) 6.25 MG tablet TAKE 1 TABLET (6.25 MG TOTAL) BY MOUTH 2 (TWO) TIMES DAILY. 60 tablet 6  . cholecalciferol (VITAMIN D) 1000 UNITS tablet Take 4,000-5,000 Units by mouth See admin instructions. Take 4 tablets four times a week and take 5 tablets three times a week    . diphenhydrAMINE (BENADRYL) 25 MG tablet Take 25 mg by mouth at bedtime as needed for allergies.     . Dulaglutide (TRULICITY) 7.90 WI/0.9BD SOPN Inject 1 pen into the skin once a week. 0.5 mL 3  . fluticasone (FLONASE) 50 MCG/ACT nasal spray Place into both nostrils daily.    [name] glimepiride (AMARYL) 4 MG tablet Take 4 mg by mouth 2 (two) times daily.    [name] glucose blood  (ACCU-CHEK AVIVA PLUS) test strip Check blood glucose 3 xdaily.   DX-E11.29 - fluctuating glucoses - uncontrolled Diabetes 300 each 12  . loratadine (CLARITIN) 10 MG tablet Take 10 mg by mouth daily.    . metFORMIN (GLUCOPHAGE-XR) 500 MG 24 hr tablet Take 2 tablets (1,000 mg total) by mouth 2 (two) times daily. 360 tablet 4  . metolazone (ZAROXOLYN) 5 MG tablet Take 5 mg by mouth daily as needed (weight gain and edema).    . Multiple Vitamin (MULTIVITAMIN WITH MINERALS) TABS tablet Take 1 tablet by mouth daily.    . potassium chloride SA (K-DUR,KLOR-CON) 20 MEQ tablet Take 20 mEq by mouth 2 (two) times daily.    . ranitidine (ZANTAC) 150 MG tablet Take 150 mg by mouth daily as needed for heartburn.     . sertraline (ZOLOFT) 100 MG tablet TAKE 1 TABLET BY MOUTH DAILY 30 tablet 11  . torsemide (DEMADEX) 100 MG tablet Take 100 mg in AM and 50 mg in PM if needed for weight gain    . valsartan (DIOVAN) 320 MG tablet Take 160 mg by mouth daily.    [name] warfarin (COUMADIN) 5 MG tablet Take 10 mg by mouth daily.     No current facility-administered medications on file prior to visit.    ROS: all negative except above.   Physical Exam: Filed Weights   07/11/15 1444  Weight: 188 lb (85.276 kg)   BP 108/60 mmHg  Pulse 78  Temp(Src) 98.2 F (36.8 C) (Temporal)  Resp 16  Ht 5' 11"  (1.803 m)  Wt 188 lb (85.276 kg)  BMI 26.23 kg/m2 General Appearance: Well developed well nourished, non-toxic appearing in no apparent distress. Eyes: PERRLA, EOMs, conjunctiva w/ no swelling or erythema or discharge Sinuses: No Frontal/maxillary tenderness ENT/Mouth: Ear canals clear without swelling or erythema.  TM's normal bilaterally with no retractions, bulging, or loss of landmarks.   Neck: Supple, thyroid normal, no notable JVD  Respiratory: Respiratory effort normal, Clear breath sounds anteriorly and posteriorly bilaterally without rales, rhonchi, wheezing or stridor. No retractions or accessory muscle  usage. Cardio: RRR with no RGs.  He does have a 3+ murmur heard best on LSB.     Abdomen: Soft, + BS.  Non tender, no guarding, rebound, hernias, masses.  Musculoskeletal: Full ROM, 5/5 strength, normal gait.  Skin: Warm, dry without rashes  Neuro: Awake and oriented X 3, Cranial nerves intact. Normal muscle tone, no cerebellar symptoms. Sensation intact.  Psych: normal affect, Insight and Judgment appropriate.     Starlyn Skeans, PA-C 3:03 PM [facility]

## 2015-07-12 ENCOUNTER — Other Ambulatory Visit: Payer: Self-pay | Admitting: Internal Medicine

## 2015-07-12 LAB — BASIC METABOLIC PANEL WITH GFR
BUN: 44 mg/dL — ABNORMAL HIGH (ref 7–25)
CALCIUM: 9 mg/dL (ref 8.6–10.3)
CO2: 28 mmol/L (ref 20–31)
CREATININE: 2.05 mg/dL — AB (ref 0.70–1.18)
Chloride: 97 mmol/L — ABNORMAL LOW (ref 98–110)
GFR, Est African American: 35 mL/min — ABNORMAL LOW (ref >=60)
GFR, Est Non African American: 31 mL/min — ABNORMAL LOW (ref >=60)
Glucose, Bld: 221 mg/dL — ABNORMAL HIGH (ref 65–99)
Potassium: 3.5 mmol/L (ref 3.5–5.3)
SODIUM: 140 mmol/L (ref 135–146)

## 2015-07-12 LAB — HEPATIC FUNCTION PANEL
ALBUMIN: 4.3 g/dL (ref 3.6–5.1)
ALT: 16 U/L (ref 9–46)
AST: 14 U/L (ref 10–35)
Alkaline Phosphatase: 59 U/L (ref 40–115)
Bilirubin, Direct: 0.1 mg/dL (ref <=0.2)
Indirect Bilirubin: 0.7 mg/dL (ref 0.2–1.2)
Total Bilirubin: 0.8 mg/dL (ref 0.2–1.2)
Total Protein: 7.2 g/dL (ref 6.1–8.1)

## 2015-07-24 ENCOUNTER — Encounter: Payer: Self-pay | Admitting: *Deleted

## 2015-07-24 ENCOUNTER — Other Ambulatory Visit: Payer: Self-pay | Admitting: *Deleted

## 2015-07-24 NOTE — Patient Outreach (Signed)
[place] [facility]) Care Management  [place]  07/24/2015   NICKOLAOS BRALLIER 11-Nov-1940 [id number]  Subjective: RN Health Coach telephone call to patient.  [employer] compliance verified. Per patient his blood sugar today fasting 107. Patient has not had any hypo or hyperglycemic reactions since our last conversation. Patient is doing very well trying to eat healthy foods fresh vegetables and snacks. Patient is in a structured exercise class. Patient goal is to get his A1C down by 1 point the next blood draw. The patient stated he is doing so much better with the trulicity. His blood sugars are staying down. RN discussed with patient about follow up every 2 month. Patient has agreed.   Objective:   Encounter Medications:  Outpatient Encounter Prescriptions as of 07/24/2015  Medication Sig  . ALPRAZolam (XANAX) 1 MG tablet Take 0.5 mg by mouth at bedtime as needed for sleep. Reported on 01/10/2015  . atorvastatin (LIPITOR) 40 MG tablet TAKE 1 TABLET BY MOUTH EVERY DAY  . B Complex Vitamins (VITAMIN B COMPLEX PO) Take 1 tablet by mouth daily.  [name] BAYER MICROLET LANCETS lancets Test once a day  . Blood Glucose Monitoring Suppl (ACCU-CHEK AVIVA PLUS) w/Device KIT Check blood sugar 1 time  daily  . carvedilol (COREG) 6.25 MG tablet TAKE 1 TABLET (6.25 MG TOTAL) BY MOUTH 2 (TWO) TIMES DAILY.  . cholecalciferol (VITAMIN D) 1000 UNITS tablet Take 4,000-5,000 Units by mouth See admin instructions. Take 4 tablets four times a week and take 5 tablets three times a week  . diphenhydrAMINE (BENADRYL) 25 MG tablet Take 25 mg by mouth at bedtime as needed for allergies.   . Dulaglutide (TRULICITY) 3.55 HR/4.1UL SOPN Inject 1 pen into the skin once a week.  . fluticasone (FLONASE) 50 MCG/ACT nasal spray Place into both nostrils daily.  [name] glimepiride (AMARYL) 4 MG tablet Take 4 mg by mouth 2 (two) times daily.  [name] glucose blood (ACCU-CHEK AVIVA PLUS) test strip Check blood glucose 3 xdaily.    DX-E11.29 - fluctuating glucoses - uncontrolled Diabetes  . loratadine (CLARITIN) 10 MG tablet Take 10 mg by mouth daily.  . metFORMIN (GLUCOPHAGE-XR) 500 MG 24 hr tablet Take 2 tablets (1,000 mg total) by mouth 2 (two) times daily.  . metolazone (ZAROXOLYN) 5 MG tablet Take 5 mg by mouth daily as needed (weight gain and edema).  . Multiple Vitamin (MULTIVITAMIN WITH MINERALS) TABS tablet Take 1 tablet by mouth daily.  . Multiple Vitamins-Minerals (PRESERVISION AREDS 2) CAPS Take by mouth 2 (two) times daily.  . potassium chloride SA (K-DUR,KLOR-CON) 20 MEQ tablet Take 20 mEq by mouth 2 (two) times daily.  . ranitidine (ZANTAC) 150 MG tablet Take 150 mg by mouth daily as needed for heartburn.   . sertraline (ZOLOFT) 100 MG tablet TAKE 1 TABLET BY MOUTH DAILY  . torsemide (DEMADEX) 100 MG tablet Take 100 mg in AM and 50 mg in PM if needed for weight gain  . valsartan (DIOVAN) 320 MG tablet Take 160 mg by mouth daily.  [name] warfarin (COUMADIN) 5 MG tablet Take 10 mg by mouth daily.   No facility-administered encounter medications on file as of 07/24/2015.    Functional Status:  In your present state of health, do you have any difficulty performing the following activities: 07/24/2015 06/24/2015  Hearing? N N  Vision? N N  Difficulty concentrating or making decisions? N N  Walking or climbing stairs? Y Y  Dressing or bathing? N N  Doing errands, shopping?  N N  Preparing Food and eating ? N N  Using the Toilet? N N  In the past six months, have you accidently leaked urine? N -  Do you have problems with loss of bowel control? N N  Managing your Medications? N -  Managing your Finances? N N  Housekeeping or managing your Housekeeping? N N    Fall/Depression Screening: PHQ 2/9 Scores 07/24/2015 06/24/2015 05/27/2015 05/22/2015 05/02/2015 03/18/2015 02/08/2015  PHQ - 2 Score 0 0 0 0 0 0 0    Assessment:  Patient will continue to  benefit from [employer] telephonic outreach for education and  support for diabetes self management.  Plan:  RN sent patient educational material on Your Physical Health  Have Regular Checkups RN sent patient education material on Diabetes and the Cardiovascular Risks RN sent educational material on Exercise to Help Your Blood Glucose RN will follow up outreach to patient with the month of September  Johny Shock BSN RN [place] [facility] [phone]

## 2015-08-05 ENCOUNTER — Ambulatory Visit (INDEPENDENT_AMBULATORY_CARE_PROVIDER_SITE_OTHER): Payer: Medicare Other | Admitting: Physician Assistant

## 2015-08-05 ENCOUNTER — Encounter: Payer: Self-pay | Admitting: Physician Assistant

## 2015-08-05 VITALS — BP 132/74 | HR 72 | Temp 98.4°F | Resp 16 | Ht 71.0 in | Wt 189.0 lb

## 2015-08-05 DIAGNOSIS — I272 Other secondary pulmonary hypertension: Secondary | ICD-10-CM | POA: Diagnosis not present

## 2015-08-05 DIAGNOSIS — Z0000 Encounter for general adult medical examination without abnormal findings: Secondary | ICD-10-CM

## 2015-08-05 DIAGNOSIS — I43 Cardiomyopathy in diseases classified elsewhere: Secondary | ICD-10-CM

## 2015-08-05 DIAGNOSIS — Z79899 Other long term (current) drug therapy: Secondary | ICD-10-CM

## 2015-08-05 DIAGNOSIS — I480 Paroxysmal atrial fibrillation: Secondary | ICD-10-CM | POA: Diagnosis not present

## 2015-08-05 DIAGNOSIS — M1990 Unspecified osteoarthritis, unspecified site: Secondary | ICD-10-CM

## 2015-08-05 DIAGNOSIS — I484 Atypical atrial flutter: Secondary | ICD-10-CM | POA: Diagnosis not present

## 2015-08-05 DIAGNOSIS — I110 Hypertensive heart disease with heart failure: Secondary | ICD-10-CM

## 2015-08-05 DIAGNOSIS — J90 Pleural effusion, not elsewhere classified: Secondary | ICD-10-CM

## 2015-08-05 DIAGNOSIS — I10 Essential (primary) hypertension: Secondary | ICD-10-CM

## 2015-08-05 DIAGNOSIS — K2100 Gastro-esophageal reflux disease with esophagitis, without bleeding: Secondary | ICD-10-CM

## 2015-08-05 DIAGNOSIS — Q211 Atrial septal defect: Secondary | ICD-10-CM

## 2015-08-05 DIAGNOSIS — Z0001 Encounter for general adult medical examination with abnormal findings: Secondary | ICD-10-CM

## 2015-08-05 DIAGNOSIS — J984 Other disorders of lung: Secondary | ICD-10-CM

## 2015-08-05 DIAGNOSIS — Z23 Encounter for immunization: Secondary | ICD-10-CM

## 2015-08-05 DIAGNOSIS — N183 Chronic kidney disease, stage 3 unspecified: Secondary | ICD-10-CM

## 2015-08-05 DIAGNOSIS — G4733 Obstructive sleep apnea (adult) (pediatric): Secondary | ICD-10-CM

## 2015-08-05 DIAGNOSIS — I2510 Atherosclerotic heart disease of native coronary artery without angina pectoris: Secondary | ICD-10-CM | POA: Diagnosis not present

## 2015-08-05 DIAGNOSIS — J9611 Chronic respiratory failure with hypoxia: Secondary | ICD-10-CM | POA: Diagnosis not present

## 2015-08-05 DIAGNOSIS — I5032 Chronic diastolic (congestive) heart failure: Secondary | ICD-10-CM | POA: Diagnosis not present

## 2015-08-05 DIAGNOSIS — Z951 Presence of aortocoronary bypass graft: Secondary | ICD-10-CM

## 2015-08-05 DIAGNOSIS — Z8679 Personal history of other diseases of the circulatory system: Secondary | ICD-10-CM

## 2015-08-05 DIAGNOSIS — Z9889 Other specified postprocedural states: Secondary | ICD-10-CM

## 2015-08-05 DIAGNOSIS — Z9989 Dependence on other enabling machines and devices: Secondary | ICD-10-CM

## 2015-08-05 DIAGNOSIS — K5730 Diverticulosis of large intestine without perforation or abscess without bleeding: Secondary | ICD-10-CM

## 2015-08-05 DIAGNOSIS — Z7901 Long term (current) use of anticoagulants: Secondary | ICD-10-CM

## 2015-08-05 DIAGNOSIS — E559 Vitamin D deficiency, unspecified: Secondary | ICD-10-CM

## 2015-08-05 DIAGNOSIS — Q2112 Patent foramen ovale: Secondary | ICD-10-CM

## 2015-08-05 DIAGNOSIS — Z86010 Personal history of colonic polyps: Secondary | ICD-10-CM

## 2015-08-05 DIAGNOSIS — I4892 Unspecified atrial flutter: Secondary | ICD-10-CM

## 2015-08-05 DIAGNOSIS — I5033 Acute on chronic diastolic (congestive) heart failure: Secondary | ICD-10-CM | POA: Diagnosis not present

## 2015-08-05 DIAGNOSIS — J948 Other specified pleural conditions: Secondary | ICD-10-CM

## 2015-08-05 DIAGNOSIS — R6889 Other general symptoms and signs: Secondary | ICD-10-CM | POA: Diagnosis not present

## 2015-08-05 DIAGNOSIS — E782 Mixed hyperlipidemia: Secondary | ICD-10-CM

## 2015-08-05 DIAGNOSIS — E1122 Type 2 diabetes mellitus with diabetic chronic kidney disease: Secondary | ICD-10-CM

## 2015-08-05 LAB — PROTIME-INR
INR: 2.2 — ABNORMAL HIGH
PROTHROMBIN TIME: 22.9 s — AB (ref 9.0–11.5)

## 2015-08-05 MED ORDER — POTASSIUM CHLORIDE CRYS ER 20 MEQ PO TBCR: 20.0000 meq | EXTENDED_RELEASE_TABLET | Freq: Two times a day (BID) | ORAL | 4 refills | Status: DC

## 2015-08-05 NOTE — Progress Notes (Signed)
MEDICARE ANNUAL WELLNESS VISIT AND FOLLOW UP Assessment:   PAF (paroxysmal atrial fibrillation) ([place]) Continue follow up cardio, continue coumadin -     CBC with Differential/Platelet  Chronic diastolic congestive heart failure (HCC) Weight stable, continue medications, refill potassium -     potassium chloride SA (K-DUR,KLOR-CON) 20 MEQ tablet; Take 1 tablet (20 mEq total) by mouth 2 (two) times daily.  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 Call if greater than 130/80.  -     BASIC METABOLIC PANEL WITH GFR  Coronary artery disease involving native coronary artery of native heart without angina pectoris Control blood pressure, cholesterol, glucose, increase exercise.   PFO (patent foramen ovale) Monitoring with cardio  Pulmonary hypertension (HCC) Monitored by cardio and pulmonary, doing much better  Atrial flutter, unspecified type ([place]) Continue follow up cardio, continue coumadin  Chronic respiratory failure with hypoxia (HCC) Monitored by pulmonary  OSA on CPAP Continue CPAP  Chronic restrictive lung disease Monitored by pulmonary  Pleural effusion on left Monitored by pulmonary, lungs CTAB at this time  Diverticulosis of large intestine without hemorrhage  No tenderness, continue fiber  GERD Continue PPI/H2 blocker, diet discussed  Controlled type 2 diabetes mellitus with stage 3 chronic kidney disease, without long-term current use of insulin ([place]) Discussed general issues about diabetes pathophysiology and management., Educational material distributed., Suggested  low cholesterol diet., Encouraged aerobic exercise., Discussed foot care., Reminded to get yearly retinal exam.  Osteoarthritis, unspecified osteoarthritis type, unspecified site RICE, NSAIDS, exercises given, if not better get xray and PT referral or ortho referral.   CKD (chronic kidney disease) stage 3, GFR 30-59 ml/min  avoid NSAIDS, monitor sugars, will monitor -     BASIC METABOLIC PANEL WITH GFR  Hyperlipidemia -continue medications, check lipids, decrease fatty foods, increase activity.   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   Chronic anticoagulation Monitor INR  Colonic Polyps Up to date  Vitamin D deficiency Continue supplement  Medication management  S/P Maze operation for atrial fibrillation Continue follow up cardio  Medicare annual wellness visit, subsequent  Need for tetanus booster -     Dt vaccine greater than 7yo IM    Over 30 minutes of exam, counseling, chart review, and critical decision making was performed  Future Appointments Date Time Provider [place]  08/16/2015 8:15 AM Hayden Pedro, MD [facility] None  09/13/2015 10:45 AM [name], MD [facility] None  09/23/2015 9:30 AM Eppie Gibson [place], RN [facility]COM None  02/25/2016 9:00 AM [name], MD [facility] None     Plan:   During the course of the visit the patient was educated and counseled about appropriate screening and preventive services including:    Pneumococcal vaccine   Influenza vaccine  Prevnar 13  Td vaccine  Screening electrocardiogram  Colorectal cancer screening  Diabetes screening  Glaucoma screening  Nutrition counseling    Subjective:  [name] is a 75 y.o. male who presents for Medicare Annual Wellness Visit and 3 month follow up for HTN, hyperlipidemia, diabetes with CKD, and vitamin D Def.   His blood pressure has been controlled at  home, today their BP is BP:  132/74 He does workout, walks and works out in the yard. He denies chest pain, shortness of breath, dizziness.  He has a complicated heart history, had ASCAD and pfib/flutter in Apr 2015 with subsequent CABG with MAZE. 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. August 2015 he had CV and had RFA for flutter. Patient had great improvement after paracentesis for ascites, he is off O2 at this time. He is still on coumadin at this time, denies any bleeding, black stools, etc. He is on 20m daily, may have missed 1 day.  He did have a fall last week, no LOC/did not hit his head, tripped in a parking lot, fell on right side, has right sided chest pain, has iced it, pain with sneezing, coughing, movement. No SOB, took hydrocodone that has helped.  Lab Results  Component Value Date   INR 2.4 (H) 07/11/2015   INR 1.97 (H) 05/27/2015   INR 2.24 (H) 03/11/2015   He is on cholesterol medication and denies myalgias. His cholesterol is at goal. The cholesterol last visit was:   Lab Results  Component Value Date   CHOL 137 05/27/2015   HDL 26 (L) 05/27/2015   LDLCALC 67 05/27/2015   TRIG 219 (H) 05/27/2015   CHOLHDL 5.3 (H) 05/27/2015   He has been working on diet and exercise for Diabetes with diabetic chronic kidney disease and with other circulatory complications, he is not on bASA, he is on ACE/ARB, and denies polydipsia, polyuria and visual disturbances. Last A1C was:  Lab Results  Component Value Date   HGBA1C 8.3 (H) 05/27/2015   Last GFR Lab Results  Component Value Date   GFRNONAA 31 (L) 07/11/2015    Patient is on Vitamin D supplement.   Lab Results  Component Value Date   VD25OH 62005/22/2017     He is on xanax as needed for sleep and zoloft for depression/anxiety which is in remission.  BMI is Body mass index is 26.36 kg/m., he is working on diet and exercise. Wt Readings from Last 3  Encounters:  08/05/15 189 lb (85.7 kg)  07/11/15 188 lb (85.3 kg)  05/27/15 186 lb (84.4 kg)    Medication Review: Current Outpatient Prescriptions on File Prior to Visit  Medication Sig Dispense Refill  . ALPRAZolam (XANAX) 1 MG tablet Take 0.5 mg by mouth at bedtime as needed for sleep. Reported on 01/10/2015    . atorvastatin (LIPITOR) 40 MG tablet TAKE 1 TABLET BY MOUTH EVERY DAY 30 tablet 1  . B Complex Vitamins (VITAMIN B COMPLEX PO) Take 1 tablet by mouth daily.    .Marland KitchenBAYER MICROLET LANCETS lancets Test once a day 100 each 3  . Blood Glucose Monitoring Suppl (ACCU-CHEK AVIVA PLUS) w/Device KIT Check blood sugar 1 time  daily 1 kit 0  . carvedilol (COREG) 6.25 MG tablet TAKE 1 TABLET (6.25 MG TOTAL) BY MOUTH 2 (TWO) TIMES DAILY. 60 tablet 6  . cholecalciferol (VITAMIN D) 1000 UNITS tablet Take 4,000-5,000 Units by mouth See admin instructions. Take 4 tablets four times a week and take 5 tablets three times a week    . diphenhydrAMINE (BENADRYL) 25 MG tablet Take 25 mg by mouth at bedtime as needed for allergies.     . Dulaglutide (TRULICITY) 01.44MRX/5.4MGSOPN Inject 1 pen into the skin once a week. 0.5 mL 3  .  fluticasone (FLONASE) 50 MCG/ACT nasal spray Place into both nostrils daily.    [name] glimepiride (AMARYL) 4 MG tablet Take 4 mg by mouth 2 (two) times daily.    [name] glucose blood (ACCU-CHEK AVIVA PLUS) test strip Check blood glucose 3 xdaily.   DX-E11.29 - fluctuating glucoses - uncontrolled Diabetes 300 each 12  . loratadine (CLARITIN) 10 MG tablet Take 10 mg by mouth daily.    . metFORMIN (GLUCOPHAGE-XR) 500 MG 24 hr tablet Take 2 tablets (1,000 mg total) by mouth 2 (two) times daily. 360 tablet 4  . metolazone (ZAROXOLYN) 5 MG tablet Take 5 mg by mouth daily as needed (weight gain and edema).    . Multiple Vitamin (MULTIVITAMIN WITH MINERALS) TABS tablet Take 1 tablet by mouth daily.    . Multiple Vitamins-Minerals (PRESERVISION AREDS 2) CAPS Take by mouth 2 (two) times daily.    .  ranitidine (ZANTAC) 150 MG tablet Take 150 mg by mouth daily as needed for heartburn.     . sertraline (ZOLOFT) 100 MG tablet TAKE 1 TABLET BY MOUTH DAILY 30 tablet 11  . torsemide (DEMADEX) 100 MG tablet Take 100 mg in AM and 50 mg in PM if needed for weight gain    . valsartan (DIOVAN) 320 MG tablet Take 160 mg by mouth daily.    [name] warfarin (COUMADIN) 5 MG tablet Take 10 mg by mouth daily.     No current facility-administered medications on file prior to visit.     Allergies: Allergies  Allergen Reactions  . Sunflower Seed [Sunflower Oil] Swelling and Other (See Comments)    Tongue and lip swelling  . Horse-Derived Products Other (See Comments)    Per allergy skin test  . 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; T2_NIDDM w/CKD3 (GFR 50 ml/min); GERD; DJD (degenerative joint disease); Pleural effusion on left; Vitamin D deficiency; Medication management; Acute on chronic diastolic CHF (congestive heart failure), NYHA class 3 ([name]); Atypical atrial flutter s/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/13; Chronic anticoagulation; HTN (hypertension); Chronic respiratory failure (HCC); CAD (coronary artery disease); PFO (patent foramen ovale); 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  Screening Tests Immunization History  Administered  Date(s) Administered  . Influenza Split 10/25/2012  . Influenza, High Dose Seasonal PF 09/26/2013, 09/27/2014  . Pneumococcal Conjugate-13 01/30/2014  . Pneumococcal Polysaccharide-23 10/20/2011  . Td 01/06/2000   Preventative care: Last colonoscopy: 2008, due next year  Prior vaccinations: TD or Tdap: 2002 Influenza: 2016 Pneumococcal: 2013 Prevnar13: 2016 Shingles/Zostavax: undecided  Names of Other Physician/Practitioners you currently use: 1. [facility] here for primary care 2. Dr. Zigmund Daniel, eye doctor, last visit 06/2015 and again in 8 weeks.  3. Dr. Nicki Reaper, dentist, last visit last year Patient Care Team: [name], MD as PCP - General (Internal Medicine) Inda Castle, MD as Consulting Physician (Gastroenterology) Peter M Martinique, MD as Consulting Physician (Cardiology) Rexene Alberts, MD as Consulting Physician (Cardiothoracic Surgery)  Ina Homes, DDS as Referring Physician Hayden Pedro, MD as Consulting Physician (Ophthalmology) Lavonna [place]arch, MD as Consulting Physician (Dermatology) Josue Hector, MD as Consulting Physician (Cardiology) Thompson Grayer, MD as Consulting Physician (Cardiology) Verlin Grills, RN as [place] [facility]  Surgical: He  has a past surgical history that includes Polinydal cyst; Great toe arthrodesis, interphalangeal joint; Retina repair-right; Cataract extraction; Excision basal cell carcinoma; Polypectomy; Cardiac catheterization; Coronary artery bypass graft (N/A, 04/05/2013); MAZE (N/A, 04/05/2013); Intraoprative transesophageal echocardiogram (N/A, 04/05/2013); TEE without cardioversion (N/A, 08/23/2013); Cardioversion (N/A, 08/23/2013); TEE without cardioversion (N/A, 10/26/2013); left heart catheterization with coronary angiogram (N/A, 03/07/2013); atrial fibrillation ablation (N/A, 10/26/2013); Carpometacarpel [facility]) suspension plasty (Left, 02/14/2014); Trapezium  resection; right heart catheterization (N/A, 05/03/2014); and TEE without cardioversion (N/A, 06/05/2014). Family His family history includes Atrial fibrillation in his mother; Colon cancer in his mother; Colon polyps in his mother and sister; Dementia in his father; Diabetes in his maternal uncle; Hypertension in his mother; Stroke in his paternal uncle. Social history  He reports that he quit smoking about 34 years ago. His smoking use included Cigarettes. He has a 100.00 pack-year smoking history. He has never used smokeless tobacco. He reports that he drinks about 0.6 oz of alcohol per week . He reports that he does not use drugs.  MEDICARE WELLNESS OBJECTIVES: Physical activity: Current Exercise Habits: Home exercise routine, Type of exercise: walking, Time (Minutes): 20, Frequency (Times/Week): 3, Weekly Exercise (Minutes/Week): 60 Cardiac risk factors: Cardiac Risk Factors include: advanced age (>68mn, >>43women);diabetes mellitus;dyslipidemia;hypertension;male gender;sedentary lifestyle;obesity (BMI >30kg/m2);family history of premature cardiovascular disease Depression/mood screen:   [facility]  Decreased Interest 0  Down, Depressed, Hopeless 0  PHQ - 2 Score 0  Some recent data might be hidden    ADLs:  In your present state of health, do you have any difficulty performing the following activities: 08/05/2015 07/24/2015  Hearing? N N  Vision? N N  Difficulty concentrating or making decisions? N N  Walking or climbing stairs? Y Y  Dressing or bathing? N N  Doing errands, shopping? N N  Preparing Food and eating ? N N  Using the Toilet? N N  In the past six months, have you accidently leaked urine? N N  Do you have problems with loss of bowel control? N N  Managing your Medications? N N  Managing your Finances? N N  Housekeeping or managing your Housekeeping? N N  Some recent data might be hidden     Cognitive Testing  Alert? Yes  Normal  Appearance?Yes  Oriented to person? Yes  Place? Yes   Time? Yes  Recall of three objects?  Yes  Can perform simple calculations? Yes  Displays appropriate judgment?Yes  Can read the correct time from a watch face?Yes  EOL planning: Does patient have an advance directive?: Yes Type of Advance Directive: Healthcare Power of Attorney, Living will Does patient want to make changes to advanced directive?: No - Patient declined Copy of advanced directive(s) in chart?: No - copy requested  Review of Systems  Constitutional: Positive for malaise/fatigue. Negative for chills and fever.  HENT: Negative.   Respiratory: Positive for shortness of breath (better). Negative for cough and wheezing.   Cardiovascular: Negative for chest pain, orthopnea, leg swelling (improved) and PND.  Gastrointestinal: Negative.   Genitourinary: Positive for frequency. Negative for dysuria, flank pain, hematuria and urgency.  Musculoskeletal: Negative.   Skin: Negative.   Neurological: Negative for dizziness, tingling,  tremors, sensory change, speech change, focal weakness, seizures, loss of consciousness and weakness.  Psychiatric/Behavioral: Negative.  Negative for depression and suicidal ideas.     Objective:   Today's Vitals   08/05/15 1055  BP: 132/74  Pulse: 72  Resp: 16  Temp: 98.4 F (36.9 C)  TempSrc: Temporal  SpO2: 97%  Weight: 189 lb (85.7 kg)  Height: 5' 11"  (1.803 m)   Body mass index is 26.36 kg/m.  General appearance: alert, no distress, WD/WN, male HEENT: normocephalic, sclerae anicteric, TMs pearly, nares patent, no discharge or erythema, pharynx normal Oral cavity: MMM, no lesions Neck: supple, no lymphadenopathy, no thyromegaly, no masses Heart: RRR, normal S1, S2, no murmurs Lungs: CTA bilaterally, no wheezes, rhonchi, or rales Abdomen: +bs, soft, non tender, non distended, no masses, no hepatomegaly, no splenomegaly Musculoskeletal: nontender, no swelling, no obvious  deformity Extremities: no edema, no cyanosis, no clubbing Pulses: 2+ symmetric, upper and lower extremities, normal cap refill Neurological: alert, oriented x 3, CN2-12 intact, strength normal upper extremities and lower extremities, sensation normal throughout, DTRs 2+ throughout, no cerebellar signs, gait normal Psychiatric: normal affect, behavior normal, pleasant   Medicare Attestation I have personally reviewed: The patient's medical and social history Their use of alcohol, tobacco or illicit drugs Their current medications and supplements The patient's functional ability including ADLs,fall risks, home safety risks, cognitive, and hearing and visual impairment Diet and physical activities Evidence for depression or mood disorders  The patient's weight, height, BMI, and visual acuity have been recorded in the chart.  I have made referrals, counseling, and provided education to the patient based on review of the above and I have provided the patient with a written personalized care plan for preventive services.     Vicie Mutters, PA-C   08/05/2015

## 2015-08-05 NOTE — Patient Instructions (Signed)
Diphtheria Toxoid; Tetanus Toxoid Adsorbed, DT, Td What is this medicine? DIPHTHERIA AND TETANUS TOXOIDS ADSORBED (dif THEER ee uh and TET n [place] TOK soids ad SAWRB) is a vaccine. It is used to prevent infections of diphtheria and tetanus (lockjaw). This medicine may be used for other purposes; ask your health care provider or pharmacist if you have questions. What should I tell my health care provider before I take this medicine? They need to know if you have any of these conditions: -bleeding disorder -immune system problems -infection with fever -low levels of platelets in the blood -an unusual or allergic reaction to diphtheria or tetanus toxoid, latex, thimerosal, other medicines, foods, dyes, or preservatives -pregnant or trying to get pregnant -breast-feeding How should I use this medicine? This vaccine is for injection into a muscle. It is given by a health care professional. A copy of Vaccine Information Statements will be given before each vaccination. Read this sheet carefully each time. The sheet may change frequently. Talk to your pediatrician regarding the use of this medicine in children. While this drug may be prescribed for selected conditions, precautions do apply. Overdosage: If you think you have taken too much of this medicine contact a poison control center or emergency room at once. NOTE: This medicine is only for you. Do not share this medicine with others. What if I miss a dose? Keep appointments for follow-up (booster) doses as directed. It is important not to miss your dose. Call your doctor or health care professional if you are unable to keep an appointment. What may interact with this medicine? -adalimumab -anakinra -infliximab -live vaccines -medicines that suppress your immune system -medicines to treat cancer -medicines that treat or prevent blood clots like daily aspirin, enoxaparin, heparin, ticlopidine, warfarin -radiopharmaceuticals like iodine I-125 or  I-131 This list may not describe all possible interactions. Give your health care provider a list of all the medicines, herbs, non-prescription drugs, or dietary supplements you use. Also tell them if you smoke, drink alcohol, or use illegal drugs. Some items may interact with your medicine. What should I watch for while using this medicine? Contact your doctor or health care professional and seek emergency medical care if any serious side effects occur. This vaccine, like all vaccines, may not fully protect everyone. What side effects may I notice from receiving this medicine? Side effects that you should report to your doctor or health care professional as soon as possible: -allergic reactions like skin rash, itching or hives, swelling of the face, lips, or tongue -arthritis pain -breathing problems -changes in hearing -extreme changes in behavior -fast, irregular heartbeat -fever over 100 degrees F -pain, tingling, numbness in the hands or feet -seizures -unusually weak or tired Side effects that usually do not require medical attention (report to your doctor or health care professional if they continue or are bothersome): -aches or pains -bruising, pain, swelling at site where injected -headache -loss of appetite -low-grade fever of 100 degrees F or less -nausea, vomiting -sleepy -swollen glands This list may not describe all possible side effects. Call your doctor for medical advice about side effects. You may report side effects to FDA at 1-800-FDA-1088. Where should I keep my medicine? This drug is given in a hospital or clinic and will not be stored at home. NOTE: This sheet is a summary. It may not cover all possible information. If you have questions about this medicine, talk to your doctor, pharmacist, or health care provider.    2016, Elsevier/Gold Standard. (  2007-04-21 13:57:36)  

## 2015-08-06 LAB — CBC WITH DIFFERENTIAL/PLATELET
BASOS ABS: 0 {cells}/uL (ref 0–200)
Basophils Relative: 0 %
EOS ABS: 97 {cells}/uL (ref 15–500)
EOS PCT: 1 %
HCT: 36.4 % — ABNORMAL LOW (ref 38.5–50.0)
Hemoglobin: 12.4 g/dL — ABNORMAL LOW (ref 13.2–17.1)
LYMPHS PCT: 10 %
Lymphs Abs: 970 cells/uL (ref 850–3900)
MCH: 32.5 pg (ref 27.0–33.0)
MCHC: 34.1 g/dL (ref 32.0–36.0)
MCV: 95.5 fL (ref 80.0–100.0)
MONOS PCT: 6 %
MPV: 8.5 fL (ref 7.5–12.5)
Monocytes Absolute: 582 cells/uL (ref 200–950)
NEUTROS ABS: 8051 {cells}/uL — AB (ref 1500–7800)
NEUTROS PCT: 83 %
PLATELETS: 197 10*3/uL (ref 140–400)
RBC: 3.81 MIL/uL — ABNORMAL LOW (ref 4.20–5.80)
RDW: 14.4 % (ref 11.0–15.0)
WBC: 9.7 10*3/uL (ref 3.8–10.8)

## 2015-08-06 LAB — BASIC METABOLIC PANEL WITH GFR
BUN: 29 mg/dL — AB (ref 7–25)
CALCIUM: 9.1 mg/dL (ref 8.6–10.3)
CHLORIDE: 100 mmol/L (ref 98–110)
CO2: 31 mmol/L (ref 20–31)
CREATININE: 1.63 mg/dL — AB (ref 0.70–1.18)
GFR, Est African American: 47 mL/min — ABNORMAL LOW (ref >=60)
GFR, Est Non African American: 40 mL/min — ABNORMAL LOW (ref >=60)
Glucose, Bld: 279 mg/dL — ABNORMAL HIGH (ref 65–99)
Potassium: 3.9 mmol/L (ref 3.5–5.3)
SODIUM: 141 mmol/L (ref 135–146)

## 2015-08-16 ENCOUNTER — Encounter (INDEPENDENT_AMBULATORY_CARE_PROVIDER_SITE_OTHER): Payer: Medicare Other | Admitting: Ophthalmology

## 2015-08-16 DIAGNOSIS — H35033 Hypertensive retinopathy, bilateral: Secondary | ICD-10-CM | POA: Diagnosis not present

## 2015-08-16 DIAGNOSIS — I10 Essential (primary) hypertension: Secondary | ICD-10-CM | POA: Diagnosis not present

## 2015-08-16 DIAGNOSIS — H35371 Puckering of macula, right eye: Secondary | ICD-10-CM | POA: Diagnosis not present

## 2015-08-16 DIAGNOSIS — H348322 Tributary (branch) retinal vein occlusion, left eye, stable: Secondary | ICD-10-CM

## 2015-08-16 DIAGNOSIS — H338 Other retinal detachments: Secondary | ICD-10-CM | POA: Diagnosis not present

## 2015-08-16 DIAGNOSIS — H43813 Vitreous degeneration, bilateral: Secondary | ICD-10-CM

## 2015-08-20 ENCOUNTER — Other Ambulatory Visit: Payer: Self-pay | Admitting: Internal Medicine

## 2015-09-02 ENCOUNTER — Encounter: Payer: Self-pay | Admitting: Physician Assistant

## 2015-09-02 ENCOUNTER — Ambulatory Visit (INDEPENDENT_AMBULATORY_CARE_PROVIDER_SITE_OTHER): Payer: Medicare Other | Admitting: Physician Assistant

## 2015-09-02 VITALS — BP 130/64 | HR 65 | Temp 98.1°F | Resp 14 | Ht 71.0 in | Wt 187.6 lb

## 2015-09-02 DIAGNOSIS — I5032 Chronic diastolic (congestive) heart failure: Secondary | ICD-10-CM

## 2015-09-02 DIAGNOSIS — I110 Hypertensive heart disease with heart failure: Secondary | ICD-10-CM | POA: Diagnosis not present

## 2015-09-02 DIAGNOSIS — I5033 Acute on chronic diastolic (congestive) heart failure: Secondary | ICD-10-CM

## 2015-09-02 DIAGNOSIS — I480 Paroxysmal atrial fibrillation: Secondary | ICD-10-CM

## 2015-09-02 DIAGNOSIS — I43 Cardiomyopathy in diseases classified elsewhere: Secondary | ICD-10-CM

## 2015-09-02 LAB — PROTIME-INR
INR: 2.5 — AB
PROTHROMBIN TIME: 26 s — AB (ref 9.0–11.5)

## 2015-09-02 NOTE — Progress Notes (Signed)
Coumadin follow up  Patient is on Coumadin for PAF (paroxysmal atrial fibrillation) ([place]) [I48.0] Patient's last INR is  Lab Results  Component Value Date   INR 2.2 (H) 08/05/2015   INR 2.4 (H) 07/11/2015   INR 1.97 (H) 05/27/2015    Patient denies SOB, CP, dizziness, nose bleeds, easy bleeding, and blood in stool/urine. His coumadin dose was not changed last visit, he is on 29m every day at this time.  He has not taken ABX, has not missed any doses and denies a fall.    He also has complicated heart history with history of CABG and MAZE in 2010, failed DCCV with refractory flutter, following with Dr. BHaroldine Lawsand has done well with diueresis. .   Wt Readings from Last 5 Encounters:  09/02/15 187 lb 9.6 oz (85.1 kg)  08/05/15 189 lb (85.7 kg)  07/11/15 188 lb (85.3 kg)  05/27/15 186 lb (84.4 kg)  05/07/15 191 lb 4 oz (86.8 kg)     Current Outpatient Prescriptions on File Prior to Visit  Medication Sig Dispense Refill  . ALPRAZolam (XANAX) 1 MG tablet Take 0.5 mg by mouth at bedtime as needed for sleep. Reported on 01/10/2015    . atorvastatin (LIPITOR) 40 MG tablet TAKE 1 TABLET BY MOUTH EVERY DAY 30 tablet 1  . B Complex Vitamins (VITAMIN B COMPLEX PO) Take 1 tablet by mouth daily.    .Marland KitchenBAYER MICROLET LANCETS lancets Test once a day 100 each 3  . Blood Glucose Monitoring Suppl (ACCU-CHEK AVIVA PLUS) w/Device KIT Check blood sugar 1 time  daily 1 kit 0  . carvedilol (COREG) 6.25 MG tablet TAKE 1 TABLET (6.25 MG TOTAL) BY MOUTH 2 (TWO) TIMES DAILY. 60 tablet 6  . cholecalciferol (VITAMIN D) 1000 UNITS tablet Take 4,000-5,000 Units by mouth See admin instructions. Take 4 tablets four times a week and take 5 tablets three times a week    . diphenhydrAMINE (BENADRYL) 25 MG tablet Take 25 mg by mouth at bedtime as needed for allergies.     . fluticasone (FLONASE) 50 MCG/ACT nasal spray Place into both nostrils daily.    .Marland Kitchenglimepiride (AMARYL) 4 MG tablet Take 4 mg by mouth 2 (two) times  daily.    .Marland Kitchenglucose blood (ACCU-CHEK AVIVA PLUS) test strip Check blood glucose 3 xdaily.   DX-E11.29 - fluctuating glucoses - uncontrolled Diabetes 300 each 12  . loratadine (CLARITIN) 10 MG tablet Take 10 mg by mouth daily.    . metFORMIN (GLUCOPHAGE-XR) 500 MG 24 hr tablet Take 2 tablets (1,000 mg total) by mouth 2 (two) times daily. 360 tablet 4  . metolazone (ZAROXOLYN) 5 MG tablet Take 5 mg by mouth daily as needed (weight gain and edema).    . Multiple Vitamin (MULTIVITAMIN WITH MINERALS) TABS tablet Take 1 tablet by mouth daily.    . Multiple Vitamins-Minerals (PRESERVISION AREDS 2) CAPS Take by mouth 2 (two) times daily.    . potassium chloride SA (K-DUR,KLOR-CON) 20 MEQ tablet Take 1 tablet (20 mEq total) by mouth 2 (two) times daily. 180 tablet 4  . ranitidine (ZANTAC) 150 MG tablet Take 150 mg by mouth daily as needed for heartburn.     . sertraline (ZOLOFT) 100 MG tablet TAKE 1 TABLET BY MOUTH DAILY 30 tablet 11  . torsemide (DEMADEX) 100 MG tablet Take 100 mg in AM and 50 mg in PM if needed for weight gain    . TRULICITY 03.50MKX/3.8HWSOPN INJECT 1 PEN INTO THE  SKIN ONCE A WEEK. 0.5 mL 3  . valsartan (DIOVAN) 320 MG tablet Take 160 mg by mouth daily.    [name] warfarin (COUMADIN) 5 MG tablet Take 10 mg by mouth daily.     No current facility-administered medications on file prior to visit.    Past Medical History:  Diagnosis Date  . Adrenal adenoma   . Anxiety   . Atypical atrial flutter ([place]) 8/15, 10/15   a. DCCV 08/2013. b. s/p RFA 10/2013.  [name] Basal cell carcinoma   . CAD (coronary artery disease)    a. 04/2013 CABG x 2: LIMA to LAD, SVG to RI, EVH via R thigh.  . Chronic diastolic congestive heart failure ([place])   . Depression   . Diabetes mellitus type II   . Diverticulosis 2001  . DJD (degenerative joint disease)   . GERD (gastroesophageal reflux disease)   . H/O hiatal hernia   . History of cardioversion    x3 (years uncertain)  . Hx of adenomatous colonic polyps   .  Hyperlipidemia   . Hypertension   . Hypertensive cardiomyopathy ([place])   . Obstructive sleep apnea    compliant with CPAP  . 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 Persistent atrial fibrillation ([place])    a. s/p MAZE 04/2013 in setting of CABG. b. Amio stopped in 10/2013 after flutter ablation.  [name] PFO (patent foramen ovale)    a. Small PFO by TEE 10/2013.  [name] Pleural effusion, left    a. s/p thoracentesis 05/2013.  [name] Respiratory failure ([place])    a. Hypoxia 10/2013 - required supp O2 as inpatient, did not require it at discharge.  . S/P Maze operation for atrial fibrillation    a. 04/2013: Complete bilateral atrial lesion set using cryothermy and bipolar radiofrequency ablation with clipping of LA appendage (@ time of CABG)  . S/P Maze operation for atrial fibrillation 04/05/2013   Complete bilateral atrial lesion set using cryothermy and bipolar radiofrequency ablation with clipping of LA appendage via median sternotomy approach    Allergies  Allergen Reactions  . Sunflower Seed [Sunflower Oil] Swelling and Other (See Comments)    Tongue and lip swelling  . Horse-Derived Products Other (See Comments)    Per allergy skin test  . Tetanus Toxoids Other (See Comments)    Per allergy skin test    Review of Systems  Constitutional: Positive for malaise/fatigue. Negative for chills and fever.  HENT: Negative.   Respiratory: Positive for shortness of breath (better). Negative for cough and wheezing.   Cardiovascular: Negative for chest pain, orthopnea, leg swelling (improved) and PND.  Gastrointestinal: Negative.   Genitourinary: Positive for frequency. Negative for dysuria, flank pain, hematuria and urgency.  Musculoskeletal: Negative.   Skin: Negative.   Neurological: Negative for dizziness, tingling, tremors, sensory change, speech change, focal weakness, seizures, loss of  consciousness and weakness.  Psychiatric/Behavioral: Negative.  Negative for depression and suicidal ideas.     Physical: Blood pressure 130/64, pulse 65, temperature 98.1 F (36.7 C), resp. rate 14, height 5' 11"  (1.803 m), weight 187 lb 9.6 oz (85.1 kg), SpO2 97 %. Filed Weights   09/02/15 1056  Weight: 187 lb 9.6 oz (85.1 kg)    General Appearance: Well nourished, in no apparent distress. ENT/Mouth: Nares clear with no erythema, swelling, mucus on turbinates. No ulcers, cracking, on lips. No erythema, swelling, or exudate on post pharynx.  Neck: Supple, thyroid normal.  Respiratory: lungs CTAB  Cardio:  Irregular, irregular rhythm, with holosystolic murmur without rubs or gallops. No edema  Abdomen: distended, with bowl sounds. Non tender, no guarding, rebound, hernias, masses, or organomegaly.  Skin: Warm, dry without rashes, lesions, ecchymosis.  Neuro: Unremarkable  Assessment and plan: Chronic anticoagulation- check INR and will adjust medication according to labs.  Discussed if patient falls to immediately contact office or go to ER. Discussed foods that can increase or decrease Coumadin levels. Patient understands to call the office before starting a new medication. Follow up in one month.   PAF (paroxysmal atrial fibrillation) (HCC) Rate controlled -     Protime-INR  Chronic diastolic congestive heart failure (HCC) Weight stable, continue follow up  Hypertensive cardiomyopathy, with heart failure (HCC) Weight stable, continue follow up  Acute on chronic diastolic CHF (congestive heart failure), NYHA class 3 ([place]) Weight stable, continue follow up Weight daily    Future Appointments Date Time Provider [place]  09/13/2015 10:45 AM [name], MD [facility] None  09/23/2015 9:30 AM Verlin Grills, RN [facility] None  11/08/2015 8:15 AM Hayden Pedro, MD [facility] None  02/25/2016 9:00 AM [name], MD [facility] None

## 2015-09-09 ENCOUNTER — Other Ambulatory Visit: Payer: Self-pay | Admitting: Cardiovascular Disease

## 2015-09-13 ENCOUNTER — Ambulatory Visit: Payer: Self-pay | Admitting: Internal Medicine

## 2015-09-21 ENCOUNTER — Other Ambulatory Visit: Payer: Self-pay | Admitting: Internal Medicine

## 2015-09-23 ENCOUNTER — Other Ambulatory Visit: Payer: Self-pay | Admitting: *Deleted

## 2015-09-23 ENCOUNTER — Other Ambulatory Visit (HOSPITAL_COMMUNITY): Payer: Self-pay | Admitting: *Deleted

## 2015-09-23 MED ORDER — CARVEDILOL 6.25 MG PO TABS: ORAL_TABLET | ORAL | 3 refills | Status: DC

## 2015-09-24 ENCOUNTER — Encounter: Payer: Self-pay | Admitting: *Deleted

## 2015-09-24 ENCOUNTER — Other Ambulatory Visit: Payer: Self-pay | Admitting: Internal Medicine

## 2015-09-24 NOTE — Patient Outreach (Signed)
[facility]) Care Management  09/24/2015   ALVESTER EADS [birth year] [id number]  Subjective: RN Health Coach telephone call to patient.  [employer] compliance verified. Patient is trying to eat fresh foods. His fasting blood sugar is 122. Per patient his weight is 185.4. Patient is documenting all blood sugars and weight. Patient stated that he is having some shortness of breath with moderate exercise. Per patient she got out of the truck and tripped on the curb and hurt his ribs.  Per patient the soreness is much better. Patient has not had any hypo or hyperglycemic reactions since  last outreach  call. Patient agreed to follow up with outreach call  Objective:   Current Medications:  Current Outpatient Prescriptions  Medication Sig Dispense Refill  . ALPRAZolam (XANAX) 1 MG tablet Take 0.5 mg by mouth at bedtime as needed for sleep. Reported on 01/10/2015    . atorvastatin (LIPITOR) 40 MG tablet TAKE 1 TABLET BY MOUTH EVERY DAY 30 tablet 1  . B Complex Vitamins (VITAMIN B COMPLEX PO) Take 1 tablet by mouth daily.    [name] BAYER MICROLET LANCETS lancets Test once a day 100 each 3  . Blood Glucose Monitoring Suppl (ACCU-CHEK AVIVA PLUS) w/Device KIT Check blood sugar 1 time  daily 1 kit 0  . carvedilol (COREG) 6.25 MG tablet TAKE 1 TABLET (6.25 MG TOTAL) BY MOUTH 2 (TWO) TIMES DAILY. 180 tablet 3  . cholecalciferol (VITAMIN D) 1000 UNITS tablet Take 4,000-5,000 Units by mouth See admin instructions. Take 4 tablets four times a week and take 5 tablets three times a week    . diphenhydrAMINE (BENADRYL) 25 MG tablet Take 25 mg by mouth at bedtime as needed for allergies.     . fluticasone (FLONASE) 50 MCG/ACT nasal spray Place into both nostrils daily.    [name] glimepiride (AMARYL) 4 MG tablet Take 4 mg by mouth 2 (two) times daily.    [name] glucose blood (ACCU-CHEK AVIVA PLUS) test strip Check blood glucose 3 xdaily.   DX-E11.29 - fluctuating glucoses - uncontrolled Diabetes 300 each 12  .  loratadine (CLARITIN) 10 MG tablet Take 10 mg by mouth daily.    . metFORMIN (GLUCOPHAGE-XR) 500 MG 24 hr tablet Take 2 tablets (1,000 mg total) by mouth 2 (two) times daily. 360 tablet 4  . metolazone (ZAROXOLYN) 5 MG tablet Take 5 mg by mouth daily as needed (weight gain and edema).    . metolazone (ZAROXOLYN) 5 MG tablet Take 5 mg by mouth daily as needed (weight gain and edema). 20 tablet 1  . Multiple Vitamin (MULTIVITAMIN WITH MINERALS) TABS tablet Take 1 tablet by mouth daily.    . Multiple Vitamins-Minerals (PRESERVISION AREDS 2) CAPS Take by mouth 2 (two) times daily.    . potassium chloride SA (K-DUR,KLOR-CON) 20 MEQ tablet Take 1 tablet (20 mEq total) by mouth 2 (two) times daily. 180 tablet 4  . ranitidine (ZANTAC) 150 MG tablet Take 150 mg by mouth daily as needed for heartburn.     . sertraline (ZOLOFT) 100 MG tablet TAKE 1 TABLET BY MOUTH DAILY 30 tablet 11  . torsemide (DEMADEX) 100 MG tablet Take 100 mg in AM and 50 mg in PM if needed for weight gain    . TRULICITY 5.36 IW/8.0HO SOPN INJECT 1 PEN INTO THE SKIN ONCE A WEEK. 0.5 mL 3  . valsartan (DIOVAN) 320 MG tablet Take 160 mg by mouth daily.    [name] warfarin (COUMADIN) 5 MG tablet Take 10  mg by mouth daily.     No current facility-administered medications for this visit.     Functional Status:  In your present state of health, do you have any difficulty performing the following activities: 09/24/2015 08/05/2015  Hearing? N N  Vision? N N  Difficulty concentrating or making decisions? N N  Walking or climbing stairs? Y Y  Dressing or bathing? N N  Doing errands, shopping? N N  Preparing Food and eating ? N N  Using the Toilet? N N  In the past six months, have you accidently leaked urine? N N  Do you have problems with loss of bowel control? N N  Managing your Medications? N N  Managing your Finances? N N  Housekeeping or managing your Housekeeping? - N  Some recent data might be hidden    Fall/Depression  Screening: PHQ 2/9 Scores 09/24/2015 08/05/2015 07/24/2015 06/24/2015 05/27/2015 05/22/2015 05/02/2015  PHQ - 2 Score 0 0 0 0 0 0 0   [facility] CM Care Plan Problem One   Flowsheet Row Most Recent Value  Care Plan Problem One  Knowlwdge deficit in self management of Diabetes  Role Documenting the Problem One  [place] for Problem One  Active  [facility]N Long Term Goal (31-90 days)  Patient will show a decrease in A1C by one point in 90 days  [facility] Long Term Goal Start Date  09/24/15  Interventions for Problem One Long Term Goal  RN sent educational material on your goal of A1C, How the A1C helps , The A1C test.  Controlling your blood sugar, Patient medicationa has been changed and patient feels it is controlling his blood sugars better. RN will follow up with a month for teachback and discussion  [facility] CM Short Term Goal #3 (0-30 days)  Patient will be able to verbalize blood sugar range under 200 within the next 30 days  THN CM Short Term Goal #4 (0-30 days)  Patient will be able to verbalize what his cholesterol level results mean within 30 days  THN CM Short Term Goal #4 Start Date  09/24/15  Interventions for Short Term Goal #4  RN sent patient EMMI information about cholesterol and lifestyle, Cholesterol testing and results. RN will follow up with discussion and teachback regarding his results.      Assessment:  Patient will continue  benefit from Health Coach telephonic outreach for education and support for diabetes self management.  Plan:  RN will send patient new calendar book for 2017-2018 for documentation RN sent educational material when you are sick RN sent educational material on Falls prevention RN will follow up with patient outreach in the month of October  Mahiya Kercheval [place] [facility] [phone]

## 2015-10-03 ENCOUNTER — Ambulatory Visit (INDEPENDENT_AMBULATORY_CARE_PROVIDER_SITE_OTHER): Payer: Medicare Other | Admitting: Physician Assistant

## 2015-10-03 ENCOUNTER — Encounter: Payer: Self-pay | Admitting: Physician Assistant

## 2015-10-03 VITALS — BP 100/56 | HR 62 | Temp 98.6°F | Resp 16 | Ht 71.0 in | Wt 185.0 lb

## 2015-10-03 DIAGNOSIS — I110 Hypertensive heart disease with heart failure: Secondary | ICD-10-CM

## 2015-10-03 DIAGNOSIS — I480 Paroxysmal atrial fibrillation: Secondary | ICD-10-CM | POA: Diagnosis not present

## 2015-10-03 DIAGNOSIS — I5032 Chronic diastolic (congestive) heart failure: Secondary | ICD-10-CM

## 2015-10-03 DIAGNOSIS — I43 Cardiomyopathy in diseases classified elsewhere: Secondary | ICD-10-CM

## 2015-10-03 DIAGNOSIS — I5033 Acute on chronic diastolic (congestive) heart failure: Secondary | ICD-10-CM

## 2015-10-03 DIAGNOSIS — Z23 Encounter for immunization: Secondary | ICD-10-CM | POA: Diagnosis not present

## 2015-10-03 LAB — BASIC METABOLIC PANEL WITH GFR
BUN: 38 mg/dL — AB (ref 7–25)
CHLORIDE: 98 mmol/L (ref 98–110)
CO2: 29 mmol/L (ref 20–31)
CREATININE: 2.04 mg/dL — AB (ref 0.70–1.18)
Calcium: 9.4 mg/dL (ref 8.6–10.3)
GFR, Est African American: 36 mL/min — ABNORMAL LOW (ref >=60)
GFR, Est Non African American: 31 mL/min — ABNORMAL LOW (ref >=60)
GLUCOSE: 209 mg/dL — AB (ref 65–99)
Potassium: 3.5 mmol/L (ref 3.5–5.3)
Sodium: 140 mmol/L (ref 135–146)

## 2015-10-03 MED ORDER — ALPRAZOLAM 1 MG PO TABS: 0.5000 mg | ORAL_TABLET | Freq: Every evening | ORAL | 0 refills | Status: DC | PRN

## 2015-10-03 NOTE — Patient Instructions (Signed)
Warfarin Coagulopathy °Warfarin (Coumadin®) coagulopathy refers to bleeding that may occur as a complication of the medicine warfarin. Warfarin is an oral blood thinner (anticoagulant). Warfarin is used for medical conditions where thinning of the blood is needed to prevent blood clots.  °CAUSES °Bleeding is the most common and most serious complication of warfarin. The amount of bleeding is related to the warfarin dose and length of treatment. In addition, bleeding complications can also occur due to: °· Intentional or accidental warfarin overdose. °· Underlying medical conditions. °· Dietary changes. °· Medicine, herbal, supplement, or alcohol interactions. °SYMPTOMS °Severe bleeding while on warfarin may occur from any tissue or organ. Symptoms of the blood being too thin may include: °· Bleeding from the nose or gums. °· Blood in bowel movements which may appear as bright red, dark, or black tarry stools. °· Blood in the urine which may appear as pink, red, or brown urine. °· Unusual bruising or bruising easily. °· A cut that does not stop bleeding within 10 minutes. °· Vomiting blood or continuous nausea for more than 1 day. °· Coughing up blood. °· Broken blood vessels in your eye (subconjunctival hemorrhage). °· Abdominal or back pain with or without flank bruising. °· Sudden, severe headache. °· Sudden weakness or numbness of the face, arm, or leg, especially on one side of the body. °· Sudden confusion. °· Trouble speaking (aphasia) or understanding. °· Sudden trouble seeing in one or both eyes. °· Sudden trouble walking. °· Dizziness. °· Loss of balance or coordination. °· Vaginal bleeding. °· Swelling or pain at an injection site. °· Superficial fat tissue death (necrosis) which may cause skin scarring. This is more common in women and may first present as pain in the waist, thighs, or buttocks. °HOME CARE INSTRUCTIONS °· Always contact your health care provider of any concerns or signs of possible  warfarin coagulopathy as soon as possible. °· Take warfarin exactly as directed by your health care provider. It is recommended that you take your warfarin dose at the same time of the day. If you have been told to stop taking warfarin, do not resume taking warfarin until directed to do so by your health care provider. Follow your health care provider's instructions if you accidentally take an extra dose or miss a dose of warfarin. It is very important to take warfarin as directed since bleeding or blood clots could result in chronic or permanent injury, pain, or disability. °· Keep all follow-up appointments with your health care provider as directed. It is very important to keep your appointments. Not keeping appointments could result in a chronic or permanent injury, pain, or disability because warfarin is a medicine that requires close monitoring. °· While taking warfarin, you will need to have regular blood tests to measure your blood clotting time. These blood tests usually include both the prothrombin time (PT) and International Normalized Ratio (INR) tests. The PT and INR results allow your health care provider to adjust your dose of warfarin. The dose can change for many reasons. It is critically important that you have your PT and INR levels drawn exactly as directed. Your warfarin dose may stay the same or change depending on what the PT and INR results are. Be sure to follow up with your health care provider regarding your PT and INR test results and what your warfarin dosage should be. °· Many medicines can interfere with warfarin and affect the PT and INR results. You must tell your health care provider about any and   all medicines you take. This includes all vitamins and supplements. Ask your health care provider before taking these. Prescription and over-the-counter medicine consistency is critical to warfarin management. It is important that potential interactions are checked before you start a new  medicine. Be especially cautious with aspirin and anti-inflammatory medicines. Ask your health care provider before taking these. Medicines such as antibiotics and acid-reducing medicine can interact with warfarin and can cause an increased warfarin effect. Warfarin can also interfere with the effectiveness of medicines you are taking. Do not take or discontinue any prescribed or over-the-counter medicine except on the advice of your health care provider or pharmacist. °· Some vitamins, supplements, and herbal products interfere with the effectiveness of warfarin. Vitamin E may increase the anticoagulant effects of warfarin. Vitamin K can cause warfarin to be less effective. Do not take or discontinue any vitamin, supplement, or herbal product except on the advice of your health care provider or pharmacist. °· Eat what you normally eat and keep the vitamin K content of your diet consistent. Avoid major changes in your diet, or notify your health care provider before changing your diet. Suddenly getting a lot more vitamin K could cause your blood to clot too quickly. A sudden decrease in vitamin K intake could cause your blood to clot too slowly. These changes in vitamin K intake could lead to dangerous blood clots or to bleeding. To keep your vitamin K intake consistent, you must be aware of which foods contain moderate or high amounts of vitamin K. Some foods that are high in vitamin K include spinach, kale, broccoli, cabbage, greens, Brussels sprouts, asparagus, bok choy, coleslaw, and parsley. If you drink green tea, drink the same amount each day. Arrange a visit with a dietitian to answer your questions. °· If you have a loss of appetite or get the stomach flu (viral gastroenteritis), talk to your health care provider as soon as possible. A decrease in your normal vitamin K intake can make you more sensitive to your usual dose of warfarin. °· Some medical conditions may increase your risk for bleeding while you  are taking warfarin. A fever, diarrhea lasting more than a day, worsening heart failure, or worsening liver function are some medical conditions that could affect warfarin. Contact your health care provider if you have any of these medical conditions. °· Be careful not to cut yourself when using sharp objects or while shaving. °· Alcohol can change the body's ability to handle warfarin. It is best to avoid alcoholic drinks or consume only very small amounts while taking warfarin. Notify your health care provider if you change your alcohol intake. A sudden increase in alcohol use can increase your risk of bleeding. Chronic alcohol use can cause warfarin to be less effective. °· Limit physical activities or sports that could result in a fall or cause injury. °· Do not use warfarin if you are pregnant. °· Inform all your health care providers and your dentist that you take warfarin. °· Inform all health care providers if you are taking warfarin and aspirin or platelet inhibitor medicines such as clopidogrel, ticagrelor, or prasugrel. Use of these medicines in addition to warfarin can increase your risk of bleeding or death. Taking these medicines together should only be done under the direct care of your health care providers. °SEEK IMMEDIATE MEDICAL CARE IF: °· You cough up blood. °· You have dark or black stools or there is bright red blood coming from your rectum. °· You vomit blood or have   nausea for more than 1 day. °· You have blood in the urine or pink-colored urine. °· You have unusual bruising or have increased bruising. °· You have bleeding from the nose or gums that does not stop quickly. °· You have a cut that does not stop bleeding within 2-3 minutes. °· You have sudden weakness or numbness of the face, arm, or leg, especially on one side of the body. °· You have sudden confusion. °· You have trouble speaking (aphasia) or understanding. °· You have sudden trouble seeing in one or both eyes. °· You have  sudden trouble walking. °· You have dizziness. °· You have a loss of balance or coordination. °· You have a sudden, severe headache. °· You have a serious fall or head injury, even if you are not bleeding. °· You have swelling or pain at an injection site. °· You have unexplained tenderness or pain in the abdomen, back, waist, thighs, or buttocks. °Any of these symptoms may represent a serious problem that is an emergency. Do not wait to see if the symptoms will go away. Get medical help right away. Call your local emergency services (911 in [place] Do not drive yourself to the hospital. °  °This information is not intended to replace advice given to you by your health care provider. Make sure you discuss any questions you have with your health care provider. °  °Document Released: 11/30/2005 Document Revised: 05/08/2014 Document Reviewed: 06/02/2011 °Elsevier Interactive Patient Education ©2016 [employer] ° °

## 2015-10-03 NOTE — Progress Notes (Signed)
Coumadin follow up  Patient is on Coumadin for PAF (paroxysmal atrial fibrillation) ([place]) [I48.0] Patient's last INR is  Lab Results  Component Value Date   INR 2.5 (H) 09/02/2015   INR 2.2 (H) 08/05/2015   INR 2.4 (H) 07/11/2015    Patient denies SOB, CP, dizziness, nose bleeds, easy bleeding, and blood in stool/urine. His coumadin dose was not changed last visit, he is on 33m every day at this time.  He has not taken ABX, has not missed any doses and denies a fall.    Currently in donut hole, wants trulicity samples.  He also has complicated heart history with history of CABG and MAZE in 2010, failed DCCV with refractory flutter, following with Dr. BHaroldine Lawsand has done well with diueresis. .   Wt Readings from Last 5 Encounters:  10/03/15 185 lb (83.9 kg)  09/02/15 187 lb 9.6 oz (85.1 kg)  08/05/15 189 lb (85.7 kg)  07/11/15 188 lb (85.3 kg)  05/27/15 186 lb (84.4 kg)     Current Outpatient Prescriptions on File Prior to Visit  Medication Sig Dispense Refill  . ALPRAZolam (XANAX) 1 MG tablet Take 0.5 mg by mouth at bedtime as needed for sleep. Reported on 01/10/2015    . atorvastatin (LIPITOR) 40 MG tablet TAKE 1 TABLET BY MOUTH EVERY DAY 30 tablet 1  . B Complex Vitamins (VITAMIN B COMPLEX PO) Take 1 tablet by mouth daily.    .Marland KitchenBAYER MICROLET LANCETS lancets Test once a day 100 each 3  . Blood Glucose Monitoring Suppl (ACCU-CHEK AVIVA PLUS) w/Device KIT Check blood sugar 1 time  daily 1 kit 0  . carvedilol (COREG) 6.25 MG tablet TAKE 1 TABLET (6.25 MG TOTAL) BY MOUTH 2 (TWO) TIMES DAILY. 180 tablet 3  . carvedilol (COREG) 6.25 MG tablet TAKE 1 TABLET (6.25 MG TOTAL) BY MOUTH 2 (TWO) TIMES DAILY. 60 tablet 6  . cholecalciferol (VITAMIN D) 1000 UNITS tablet Take 4,000-5,000 Units by mouth See admin instructions. Take 4 tablets four times a week and take 5 tablets three times a week    . diphenhydrAMINE (BENADRYL) 25 MG tablet Take 25 mg by mouth at bedtime as needed for allergies.      . fluticasone (FLONASE) 50 MCG/ACT nasal spray Place into both nostrils daily.    .Marland Kitchenglimepiride (AMARYL) 4 MG tablet Take 4 mg by mouth 2 (two) times daily.    .Marland Kitchenglucose blood (ACCU-CHEK AVIVA PLUS) test strip Check blood glucose 3 xdaily.   DX-E11.29 - fluctuating glucoses - uncontrolled Diabetes 300 each 12  . loratadine (CLARITIN) 10 MG tablet Take 10 mg by mouth daily.    . metFORMIN (GLUCOPHAGE-XR) 500 MG 24 hr tablet Take 2 tablets (1,000 mg total) by mouth 2 (two) times daily. 360 tablet 4  . metolazone (ZAROXOLYN) 5 MG tablet Take 5 mg by mouth daily as needed (weight gain and edema). 20 tablet 1  . Multiple Vitamin (MULTIVITAMIN WITH MINERALS) TABS tablet Take 1 tablet by mouth daily.    . Multiple Vitamins-Minerals (PRESERVISION AREDS 2) CAPS Take by mouth 2 (two) times daily.    . potassium chloride SA (K-DUR,KLOR-CON) 20 MEQ tablet Take 1 tablet (20 mEq total) by mouth 2 (two) times daily. 180 tablet 4  . ranitidine (ZANTAC) 150 MG tablet Take 150 mg by mouth daily as needed for heartburn.     . sertraline (ZOLOFT) 100 MG tablet TAKE 1 TABLET BY MOUTH DAILY 30 tablet 11  . torsemide (DEMADEX) 100 MG  tablet Take 100 mg in AM and 50 mg in PM if needed for weight gain    . TRULICITY 9.47 ML/4.6TK SOPN INJECT 1 PEN INTO THE SKIN ONCE A WEEK. 0.5 mL 3  . valsartan (DIOVAN) 320 MG tablet Take 160 mg by mouth daily.    [name] warfarin (COUMADIN) 5 MG tablet Take 10 mg by mouth daily.     No current facility-administered medications on file prior to visit.    Past Medical History:  Diagnosis Date  . Adrenal adenoma   . Anxiety   . Atypical atrial flutter ([place]) 8/15, 10/15   a. DCCV 08/2013. b. s/p RFA 10/2013.  [name] Basal cell carcinoma   . CAD (coronary artery disease)    a. 04/2013 CABG x 2: LIMA to LAD, SVG to RI, EVH via R thigh.  . Chronic diastolic congestive heart failure ([place])   . Depression   . Diabetes mellitus type II   . Diverticulosis 2001  . DJD (degenerative joint  disease)   . GERD (gastroesophageal reflux disease)   . H/O hiatal hernia   . History of cardioversion    x3 (years uncertain)  . Hx of adenomatous colonic polyps   . Hyperlipidemia   . Hypertension   . Hypertensive cardiomyopathy ([place])   . Obstructive sleep apnea    compliant with CPAP  . 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 Persistent atrial fibrillation ([place])    a. s/p MAZE 04/2013 in setting of CABG. b. Amio stopped in 10/2013 after flutter ablation.  [name] PFO (patent foramen ovale)    a. Small PFO by TEE 10/2013.  [name] Pleural effusion, left    a. s/p thoracentesis 05/2013.  [name] Respiratory failure ([place])    a. Hypoxia 10/2013 - required supp O2 as inpatient, did not require it at discharge.  . S/P Maze operation for atrial fibrillation    a. 04/2013: Complete bilateral atrial lesion set using cryothermy and bipolar radiofrequency ablation with clipping of LA appendage (@ time of CABG)  . S/P Maze operation for atrial fibrillation 04/05/2013   Complete bilateral atrial lesion set using cryothermy and bipolar radiofrequency ablation with clipping of LA appendage via median sternotomy approach    Allergies  Allergen Reactions  . Sunflower Seed [Sunflower Oil] Swelling and Other (See Comments)    Tongue and lip swelling  . Horse-Derived Products Other (See Comments)    Per allergy skin test  . Tetanus Toxoids Other (See Comments)    Per allergy skin test    Review of Systems  Constitutional: Positive for malaise/fatigue. Negative for chills and fever.  HENT: Negative.   Respiratory: Positive for shortness of breath (better). Negative for cough and wheezing.   Cardiovascular: Negative for chest pain, orthopnea, leg swelling (improved) and PND.  Gastrointestinal: Negative.   Genitourinary: Positive for frequency. Negative for dysuria, flank pain, hematuria and urgency.   Musculoskeletal: Negative.   Skin: Negative.   Neurological: Negative for dizziness, tingling, tremors, sensory change, speech change, focal weakness, seizures, loss of consciousness and weakness.  Psychiatric/Behavioral: Negative.  Negative for depression and suicidal ideas.     Physical: Blood pressure (!) 100/56, pulse 62, temperature 98.6 F (37 C), temperature source Temporal, resp. rate 16, height 5' 11"  (1.803 m), weight 185 lb (83.9 kg). Filed Weights   10/03/15 1130  Weight: 185 lb (83.9 kg)    General Appearance: Well nourished, in no  apparent distress. ENT/Mouth: Nares clear with no erythema, swelling, mucus on turbinates. No ulcers, cracking, on lips. No erythema, swelling, or exudate on post pharynx.  Neck: Supple, thyroid normal.  Respiratory: lungs CTAB  Cardio:  Irregular, irregular rhythm, with holosystolic murmur without rubs or gallops. No edema  Abdomen: distended, with bowl sounds. Non tender, no guarding, rebound, hernias, masses, or organomegaly.  Skin: Warm, dry without rashes, lesions, ecchymosis.  Neuro: Unremarkable  Assessment and plan: Chronic anticoagulation- check INR and will adjust medication according to labs.  Discussed if patient falls to immediately contact office or go to ER. Discussed foods that can increase or decrease Coumadin levels. Patient understands to call the office before starting a new medication. Follow up in one month.   PAF (paroxysmal atrial fibrillation) (HCC) Rate controlled -     Protime-INR  Chronic diastolic congestive heart failure (HCC) Weight stable, continue follow up  Hypertensive cardiomyopathy, with heart failure (HCC) Weight stable, continue follow up  Acute on chronic diastolic CHF (congestive heart failure), NYHA class 3 (HCC) Weight stable, continue follow up Weight daily  Need for influenza  Will follow up 2 months for 3 months, will see HF clinic in 69monthand get INR then.   Future  Appointments Date Time Provider [place] 10/24/2015 9:30 AM FVerlin Grills RN [facility] None  10/31/2015 11:00 AM DJolaine Artist MD [facility] None  11/08/2015 8:15 AM JHayden Pedro MD [facility] None  02/25/2016 9:00 AM WUnk Pinto MD [facility] None

## 2015-10-04 LAB — PROTIME-INR
INR: 2.3 — ABNORMAL HIGH
PROTHROMBIN TIME: 24 s — AB (ref 9.0–11.5)

## 2015-10-24 ENCOUNTER — Other Ambulatory Visit: Payer: Self-pay | Admitting: *Deleted

## 2015-10-24 ENCOUNTER — Encounter: Payer: Self-pay | Admitting: *Deleted

## 2015-10-24 NOTE — Patient Outreach (Signed)
[facility]) Care Management  10/24/2015   [name] January 14, 1940 [id number]  Subjective: RN Health Coach telephone call to patient.  [employer] compliance verified. Per patient he is having some generalize chronic pain from arthritis. Patient stated he also has a little swelling in his legs and his weight today was 183 pounds. Patient does daily weights monitored buy [employer]. Patient has a hx of Afib and CHF. Patient blood sugar today was 187 fasting. Patient stated some days it is a lower. Per patient it is better that it was. Patient has had not symptoms of hypo or hyperglycemia since last outreach.  Patient has agreed to follow up outreach.   Objective:   Current Medications:  Current Outpatient Prescriptions  Medication Sig Dispense Refill  . ALPRAZolam (XANAX) 1 MG tablet Take 0.5 tablets (0.5 mg total) by mouth at bedtime as needed for sleep. 60 tablet 0  . atorvastatin (LIPITOR) 40 MG tablet TAKE 1 TABLET BY MOUTH EVERY DAY 30 tablet 1  . B Complex Vitamins (VITAMIN B COMPLEX PO) Take 1 tablet by mouth daily.    [name] BAYER MICROLET LANCETS lancets Test once a day 100 each 3  . Blood Glucose Monitoring Suppl (ACCU-CHEK AVIVA PLUS) w/Device KIT Check blood sugar 1 time  daily 1 kit 0  . carvedilol (COREG) 6.25 MG tablet TAKE 1 TABLET (6.25 MG TOTAL) BY MOUTH 2 (TWO) TIMES DAILY. 180 tablet 3  . carvedilol (COREG) 6.25 MG tablet TAKE 1 TABLET (6.25 MG TOTAL) BY MOUTH 2 (TWO) TIMES DAILY. 60 tablet 6  . cholecalciferol (VITAMIN D) 1000 UNITS tablet Take 4,000-5,000 Units by mouth See admin instructions. Take 4 tablets four times a week and take 5 tablets three times a week    . diphenhydrAMINE (BENADRYL) 25 MG tablet Take 25 mg by mouth at bedtime as needed for allergies.     . fluticasone (FLONASE) 50 MCG/ACT nasal spray Place into both nostrils daily.    [name] glimepiride (AMARYL) 4 MG tablet Take 4 mg by mouth 2 (two) times daily.    [name] glucose blood (ACCU-CHEK AVIVA PLUS) test  strip Check blood glucose 3 xdaily.   DX-E11.29 - fluctuating glucoses - uncontrolled Diabetes 300 each 12  . HYDROcodone-acetaminophen (NORCO/VICODIN) 5-325 MG tablet Take 1 tablet by mouth every 6 (six) hours as needed for moderate pain.    [name] loratadine (CLARITIN) 10 MG tablet Take 10 mg by mouth daily.    . metFORMIN (GLUCOPHAGE-XR) 500 MG 24 hr tablet Take 2 tablets (1,000 mg total) by mouth 2 (two) times daily. 360 tablet 4  . metolazone (ZAROXOLYN) 5 MG tablet Take 5 mg by mouth daily as needed (weight gain and edema). 20 tablet 1  . Multiple Vitamin (MULTIVITAMIN WITH MINERALS) TABS tablet Take 1 tablet by mouth daily.    . Multiple Vitamins-Minerals (PRESERVISION AREDS 2) CAPS Take by mouth 2 (two) times daily.    . potassium chloride SA (K-DUR,KLOR-CON) 20 MEQ tablet Take 1 tablet (20 mEq total) by mouth 2 (two) times daily. 180 tablet 4  . ranitidine (ZANTAC) 150 MG tablet Take 150 mg by mouth daily as needed for heartburn.     . sertraline (ZOLOFT) 100 MG tablet TAKE 1 TABLET BY MOUTH DAILY 30 tablet 11  . torsemide (DEMADEX) 100 MG tablet Take 100 mg in AM and 50 mg in PM if needed for weight gain    . TRULICITY 4.69 GE/9.5MW SOPN INJECT 1 PEN INTO THE SKIN ONCE A WEEK. 0.5 mL  3  . valsartan (DIOVAN) 320 MG tablet Take 160 mg by mouth daily.    [name] warfarin (COUMADIN) 5 MG tablet Take 10 mg by mouth daily.     No current facility-administered medications for this visit.     Functional Status:  In your present state of health, do you have any difficulty performing the following activities: 09/24/2015 08/05/2015  Hearing? N N  Vision? N N  Difficulty concentrating or making decisions? N N  Walking or climbing stairs? Y Y  Dressing or bathing? N N  Doing errands, shopping? N N  Preparing Food and eating ? N N  Using the Toilet? N N  In the past six months, have you accidently leaked urine? N N  Do you have problems with loss of bowel control? N N  Managing your Medications? N N   Managing your Finances? N N  Housekeeping or managing your Housekeeping? - N  Some recent data might be hidden    Fall/Depression Screening: PHQ 2/9 Scores 10/24/2015 09/24/2015 08/05/2015 07/24/2015 06/24/2015 05/27/2015 05/22/2015  PHQ - 2 Score 0 0 0 0 0 0 0   [facility] CM Care Plan Problem One   Flowsheet Row Most Recent Value  Care Plan Problem One  Knowlwdge deficit in self management of Diabetes  Role Documenting the Problem One  [place] for Problem One  Active  [facility] Long Term Goal (31-90 days)  Patient will show a decrease in A1C by one point in 90 days  Interventions for Problem One Long Term Goal  RN sent educational material on your goal of A1C, How the A1C helps , The A1C test.  Controlling your blood sugar, Patient medicationa has been changed and patient feels it is controlling his blood sugars better. RN will follow up with a month for teachback and discussion  [facility] CM Short Term Goal #4 (0-30 days)  Patient will be able to verbalize what his cholesterol level results mean within 30 days  [facility] CM Short Term Goal #4 Start Date  10/24/15  Interventions for Short Term Goal #4  RN sent patient EMMI information about cholesterol and lifestyle, Cholesterol testing and results. RN will follow up with discussion and teachback regarding his results.  [facility] CM Short Term Goal #5 (0-30 days)  Patient will verbalize his am fasting blood suguars are under 150 within the 30-60 days  THN CM Short Term Goal #5 Start Date  10/24/15  Interventions for Short Term Goal #5  RN discussed with patient how the blood sugars are affecting the A1c level. RN sent a living well with diabetes book to patient. RN will follow up to check on ranges.      Assessment:  Patient fasting blood sugars are elevated Patient will continue to  benefit from [place] telephonic outreach for education and support for diabetes self management.  Plan: RN sent educational material on living with diabetes RN sent  educational booklet on Atrial Fib RN sent educational booklet on living better with heart failure RN discussed fasting blood sugar RN will follow up within the month of November for follow up discussion and teach back.  [place] [facility] [phone]

## 2015-10-31 ENCOUNTER — Ambulatory Visit (HOSPITAL_COMMUNITY)
Admission: RE | Admit: 2015-10-31 | Discharge: 2015-10-31 | Disposition: A | Discharge status: Home / Self Care | Payer: Medicare Other | Source: Ambulatory Visit | Attending: Internal Medicine | Admitting: Internal Medicine

## 2015-10-31 ENCOUNTER — Encounter (HOSPITAL_COMMUNITY): Payer: Self-pay | Admitting: Internal Medicine

## 2015-10-31 VITALS — BP 110/72 | HR 78 | Wt 190.8 lb

## 2015-10-31 DIAGNOSIS — Z9109 Other allergy status, other than to drugs and biological substances: Secondary | ICD-10-CM | POA: Diagnosis not present

## 2015-10-31 DIAGNOSIS — Z9889 Other specified postprocedural states: Secondary | ICD-10-CM | POA: Diagnosis not present

## 2015-10-31 DIAGNOSIS — Z87891 Personal history of nicotine dependence: Secondary | ICD-10-CM | POA: Insufficient documentation

## 2015-10-31 DIAGNOSIS — E1122 Type 2 diabetes mellitus with diabetic chronic kidney disease: Secondary | ICD-10-CM | POA: Diagnosis not present

## 2015-10-31 DIAGNOSIS — Z8249 Family history of ischemic heart disease and other diseases of the circulatory system: Secondary | ICD-10-CM | POA: Diagnosis not present

## 2015-10-31 DIAGNOSIS — I272 Pulmonary hypertension, unspecified: Secondary | ICD-10-CM | POA: Insufficient documentation

## 2015-10-31 DIAGNOSIS — I480 Paroxysmal atrial fibrillation: Secondary | ICD-10-CM | POA: Insufficient documentation

## 2015-10-31 DIAGNOSIS — K219 Gastro-esophageal reflux disease without esophagitis: Secondary | ICD-10-CM | POA: Diagnosis not present

## 2015-10-31 DIAGNOSIS — I509 Heart failure, unspecified: Secondary | ICD-10-CM | POA: Diagnosis not present

## 2015-10-31 DIAGNOSIS — N183 Chronic kidney disease, stage 3 (moderate): Secondary | ICD-10-CM | POA: Insufficient documentation

## 2015-10-31 DIAGNOSIS — I130 Hypertensive heart and chronic kidney disease with heart failure and stage 1 through stage 4 chronic kidney disease, or unspecified chronic kidney disease: Secondary | ICD-10-CM | POA: Insufficient documentation

## 2015-10-31 DIAGNOSIS — Z85828 Personal history of other malignant neoplasm of skin: Secondary | ICD-10-CM | POA: Diagnosis not present

## 2015-10-31 DIAGNOSIS — F418 Other specified anxiety disorders: Secondary | ICD-10-CM | POA: Diagnosis not present

## 2015-10-31 DIAGNOSIS — Z7901 Long term (current) use of anticoagulants: Secondary | ICD-10-CM | POA: Diagnosis not present

## 2015-10-31 DIAGNOSIS — Z86010 Personal history of colonic polyps: Secondary | ICD-10-CM | POA: Insufficient documentation

## 2015-10-31 DIAGNOSIS — Z7984 Long term (current) use of oral hypoglycemic drugs: Secondary | ICD-10-CM | POA: Insufficient documentation

## 2015-10-31 DIAGNOSIS — I5032 Chronic diastolic (congestive) heart failure: Secondary | ICD-10-CM | POA: Diagnosis not present

## 2015-10-31 DIAGNOSIS — Z91018 Allergy to other foods: Secondary | ICD-10-CM | POA: Diagnosis not present

## 2015-10-31 DIAGNOSIS — Z8371 Family history of colonic polyps: Secondary | ICD-10-CM | POA: Insufficient documentation

## 2015-10-31 DIAGNOSIS — E785 Hyperlipidemia, unspecified: Secondary | ICD-10-CM | POA: Insufficient documentation

## 2015-10-31 DIAGNOSIS — I5022 Chronic systolic (congestive) heart failure: Secondary | ICD-10-CM

## 2015-10-31 DIAGNOSIS — Z800 Family history of malignant neoplasm of digestive organs: Secondary | ICD-10-CM | POA: Insufficient documentation

## 2015-10-31 DIAGNOSIS — Z887 Allergy status to serum and vaccine status: Secondary | ICD-10-CM | POA: Insufficient documentation

## 2015-10-31 DIAGNOSIS — I2510 Atherosclerotic heart disease of native coronary artery without angina pectoris: Secondary | ICD-10-CM | POA: Diagnosis not present

## 2015-10-31 DIAGNOSIS — Z951 Presence of aortocoronary bypass graft: Secondary | ICD-10-CM | POA: Diagnosis not present

## 2015-10-31 DIAGNOSIS — Z823 Family history of stroke: Secondary | ICD-10-CM | POA: Insufficient documentation

## 2015-10-31 DIAGNOSIS — Z833 Family history of diabetes mellitus: Secondary | ICD-10-CM | POA: Insufficient documentation

## 2015-10-31 DIAGNOSIS — G4733 Obstructive sleep apnea (adult) (pediatric): Secondary | ICD-10-CM | POA: Insufficient documentation

## 2015-10-31 DIAGNOSIS — I50810 Right heart failure, unspecified: Secondary | ICD-10-CM

## 2015-10-31 DIAGNOSIS — I4892 Unspecified atrial flutter: Secondary | ICD-10-CM

## 2015-10-31 LAB — CBC
HCT: 36.6 % — ABNORMAL LOW (ref 39.0–52.0)
Hemoglobin: 12.4 g/dL — ABNORMAL LOW (ref 13.0–17.0)
MCH: 32.1 pg (ref 26.0–34.0)
MCHC: 33.9 g/dL (ref 30.0–36.0)
MCV: 94.8 fL (ref 78.0–100.0)
Platelets: 165 10*3/uL (ref 150–400)
RBC: 3.86 MIL/uL — ABNORMAL LOW (ref 4.22–5.81)
RDW: 14 % (ref 11.5–15.5)
WBC: 9 10*3/uL (ref 4.0–10.5)

## 2015-10-31 LAB — BASIC METABOLIC PANEL
ANION GAP: 12 (ref 5–15)
BUN: 38 mg/dL — ABNORMAL HIGH (ref 6–20)
CALCIUM: 10.1 mg/dL (ref 8.9–10.3)
CHLORIDE: 98 mmol/L — AB (ref 101–111)
CO2: 31 mmol/L (ref 22–32)
CREATININE: 1.98 mg/dL — AB (ref 0.61–1.24)
GFR calc non Af Amer: 31 mL/min — ABNORMAL LOW (ref >60)
GFR, EST AFRICAN AMERICAN: 36 mL/min — AB (ref >60)
Glucose, Bld: 275 mg/dL — ABNORMAL HIGH (ref 65–99)
Potassium: 3.8 mmol/L (ref 3.5–5.1)
SODIUM: 141 mmol/L (ref 135–145)

## 2015-10-31 LAB — PROTIME-INR
INR: 2.11
PROTHROMBIN TIME: 24 s — AB (ref 11.4–15.2)

## 2015-10-31 MED ORDER — TORSEMIDE 100 MG PO TABS: ORAL_TABLET | ORAL | 6 refills | Status: DC

## 2015-10-31 MED ORDER — TORSEMIDE 100 MG PO TABS: ORAL_TABLET | ORAL | 3 refills | Status: DC

## 2015-10-31 NOTE — Addendum Note (Signed)
Encounter addended by: Scarlette Calico, RN on: 10/31/2015  4:43 PM<BR>    Actions taken: Pharmacy for encounter modified, Order Entry activity accessed

## 2015-10-31 NOTE — Addendum Note (Signed)
Encounter addended by: Scarlette Calico, RN on: 10/31/2015 11:56 AM<BR>    Actions taken: Order Entry activity accessed

## 2015-10-31 NOTE — Addendum Note (Signed)
Encounter addended by: Scarlette Calico, RN on: 10/31/2015 11:54 AM<BR>    Actions taken: Visit diagnoses modified, Sign clinical note, Order Entry activity accessed, Diagnosis association updated

## 2015-10-31 NOTE — Patient Instructions (Signed)
Labs today  Your physician recommends that you schedule a follow-up appointment in: 4 weeks with echocardiogram

## 2015-10-31 NOTE — Progress Notes (Signed)
Patient ID: [name], male   DOB: 05-07-1940, 75 y.o.   MRN: [id number]   Advanced Heart Failure Clinic Note   Patient ID: [name], male   DOB: 12-23-1940, 75 y.o.   MRN: [id number]  Primary Cardiologist: Dr. Haroldine [name]   Subjective:  [name] is a 75 y/o male with COPD , DM, PAF, 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, CKD, AFL s/p ablation in 10/15. 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 Had surgery in 4/15. Did well for awhile went to cardiac rehab and was feeling fine. Could do almost anything he wanted to do. Developed AFL in 10/15 and underwent RFA. Felt good.   In 3/16 began to develop severe SOB. Started O2. Says his symptoms got worse almost overnight. 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 He has seen Dr. Roxy [name] but felt to have no optimal surgical options for repair. His case was also presented to Dr. Michaelle [name] at [facility] who agreed that there was no way to baffle or reroute the anomalous PV flow to the LA. He had a TEE which showed LVEF 60-65% with a dilated right side and a small PFO. He has also been seen by Dr. Lake [name] who performed PFTs that showed significant restrictive lung disease with a low DLCO. He had f/u with Dr. Gilles [name] in the [facility] who felt his symptoms were multifactorial. Unclear why symptom onset so quick   He presents today for regular follow-up. Feels good. Remains active around the yard and house without too much problem. Says he gets tired at times and takes a break when needed[place] Walks  1.25-1.5 miles in 25 mins.Fluid stable on torsemide. Takes a metolazone or extra torsemide about 1x/week. Weight up 3-5 pounds. At home 182-183  Echo reviewed today LVEF 55-60% RV moderately dilated mild HK. RVSP 30mHG   Labs 11/21/14 K 3.8, Cr 1.41 Labs  12/05/14  K 4.0 1.15 Labs  01/08/15  K 4.0 1.36  Labs  7/17 K 3.9 Cr  1.6 Labs 9/17 K 3.5 cr 2.04   PFTs (7/16) FEV1 1.45 L (45%) FVC 1.77 L (40%) DLCO 46%  RHC 4/16 RA = 18 RV = 72/4/17 PA = 76/27 (46) PCW = 21 Fick cardiac output/index (using PA sat) = 9.2/4.45 Thermo CO/CI = 10.0/4.87 PVR = 2.2 WU Fick cardiac output/index (using high SVC sat) = 5.2/2.5 Pulse-ox saturation = 89%  High SVC sat = 54% Low SVC sat = 81% (at SVC/RA junction) RA sat = 68% RV sat = 66% PA sat = 68%, 69% IVC sat =56%   ECGO 1/17: EF 60%. RV dilated with moderately reduced systolic function. RVSP 70. + bubble.  VQ/CT negative for PE   TEE 10/15 small PFO  Ab u/s 6/16 liver normal + ascites. Medico renal kidney disease.  Past Medical History:  Diagnosis Date  . Adrenal adenoma   . Anxiety   . [place]ical [place]utter [place], 10/15   a. DCCV 08/2013. b. s/p RFA 10/2013.  .Marland KitchenBasal cell carcinoma   . CAD (coronary artery disease)    a. 04/2013 CABG x 2: LIMA to LAD, SVG to RI, EVH via R thigh.  . Chronic diastolic congestive heart failure ([place]   . Depression   . Diabetes mellitus type II   . Diverticulosis 2001  . DJD (degenerative  joint disease)   . GERD (gastroesophageal reflux disease)   . H/O hiatal hernia   . History of cardioversion    x3 (years uncertain)  . Hx of adenomatous colonic polyps   . Hyperlipidemia   . Hypertension   . Hypertensive cardiomyopathy ([place])   . Obstructive sleep apnea    compliant with CPAP  . 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 Persistent atrial fibrillation ([place])    a. s/p MAZE 04/2013 in setting of CABG. b. Amio stopped in 10/2013 after flutter ablation.  [name] PFO (patent foramen ovale)    a. Small PFO by TEE 10/2013.  [name] Pleural effusion, left    a. s/p thoracentesis 05/2013.  [name] Respiratory failure ([place])    a. Hypoxia 10/2013 - required supp O2 as inpatient, did not require it at discharge.  . S/P  Maze operation for atrial fibrillation    a. 04/2013: Complete bilateral atrial lesion set using cryothermy and bipolar radiofrequency ablation with clipping of LA appendage (@ time of CABG)  . S/P Maze operation for atrial fibrillation 04/05/2013   Complete bilateral atrial lesion set using cryothermy and bipolar radiofrequency ablation with clipping of LA appendage via median sternotomy approach     Current Outpatient Prescriptions  Medication Sig Dispense Refill  . ALPRAZolam (XANAX) 1 MG tablet Take 0.5 tablets (0.5 mg total) by mouth at bedtime as needed for sleep. 60 tablet 0  . atorvastatin (LIPITOR) 40 MG tablet TAKE 1 TABLET BY MOUTH EVERY DAY 30 tablet 1  . B Complex Vitamins (VITAMIN B COMPLEX PO) Take 1 tablet by mouth daily.    [name] BAYER MICROLET LANCETS lancets Test once a day 100 each 3  . Blood Glucose Monitoring Suppl (ACCU-CHEK AVIVA PLUS) w/Device KIT Check blood sugar 1 time  daily 1 kit 0  . carvedilol (COREG) 6.25 MG tablet TAKE 1 TABLET (6.25 MG TOTAL) BY MOUTH 2 (TWO) TIMES DAILY. 180 tablet 3  . cholecalciferol (VITAMIN D) 1000 UNITS tablet Take 4,000-5,000 Units by mouth See admin instructions. Take 4 tablets four times a week and take 5 tablets three times a week    . diphenhydrAMINE (BENADRYL) 25 MG tablet Take 25 mg by mouth at bedtime as needed for allergies.     . fluticasone (FLONASE) 50 MCG/ACT nasal spray Place into both nostrils daily.    [name] glimepiride (AMARYL) 4 MG tablet Take 4 mg by mouth 2 (two) times daily.    [name] glucose blood (ACCU-CHEK AVIVA PLUS) test strip Check blood glucose 3 xdaily.   [id number] - fluctuating glucoses - uncontrolled Diabetes 300 each 12  . loratadine (CLARITIN) 10 MG tablet Take 10 mg by mouth daily.    . metFORMIN (GLUCOPHAGE-XR) 500 MG 24 hr tablet Take 2 tablets (1,000 mg total) by mouth 2 (two) times daily. 360 tablet 4  . Multiple Vitamin (MULTIVITAMIN WITH MINERALS) TABS tablet Take 1 tablet by mouth daily.    . Multiple  Vitamins-Minerals (PRESERVISION AREDS 2) CAPS Take by mouth 2 (two) times daily.    . potassium chloride SA (K-DUR,KLOR-CON) 20 MEQ tablet Take 1 tablet (20 mEq total) by mouth 2 (two) times daily. 180 tablet 4  . ranitidine (ZANTAC) 150 MG tablet Take 150 mg by mouth daily as needed for heartburn.     . sertraline (ZOLOFT) 100 MG tablet TAKE 1 TABLET BY MOUTH DAILY 30 tablet 11  . torsemide (DEMADEX)  100 MG tablet Take 100 mg in AM and 50 mg in PM if needed for weight gain    . TRULICITY 7.03 JK/0.9FG SOPN INJECT 1 PEN INTO THE SKIN ONCE A WEEK. 0.5 mL 3  . valsartan (DIOVAN) 320 MG tablet Take 160 mg by mouth daily.    [name] warfarin (COUMADIN) 5 MG tablet Take 10 mg by mouth daily.    [name] HYDROcodone-acetaminophen (NORCO/VICODIN) 5-325 MG tablet Take 1 tablet by mouth every 6 (six) hours as needed for moderate pain.    . metolazone (ZAROXOLYN) 5 MG tablet Take 5 mg by mouth daily as needed (weight gain and edema). (Patient not taking: Reported on 10/31/2015) 20 tablet 1   No current facility-administered medications for this encounter.     Allergies  Allergen Reactions  . Sunflower Seed [Sunflower Oil] Swelling and Other (See Comments)    Tongue and lip swelling  . Horse-Derived Products Other (See Comments)    Per allergy skin test  . Tetanus Toxoids Other (See Comments)    Per allergy skin test      Social History   Social History  . Marital status: Married    Spouse name: N/A  . Number of children: 1  . Years of education: N/A   Occupational History  . retired Software engineer    Social History Main Topics  . Smoking status: Former Smoker    Packs/day: 4.00    Years: 25.00    Types: Cigarettes    Quit date: 01/05/1981  . Smokeless tobacco: Never Used  . Alcohol use 0.6 oz/week    1 Shots of liquor per week     Comment: 1-5 drinks per week  . Drug use: No  . Sexual activity: Not on file   Other Topics Concern  . Not on file   Social History Narrative   Daily caffeine-yes     Patient gets regular exercise.   Pt lives in [place] with spouse.  Retired Software engineer.  Family History  Problem Relation Age of Onset  . Dementia Father   . Colon cancer Mother     Family History/Uncle   . Colon polyps Mother     Family History  . Atrial fibrillation Mother   . Hypertension Mother   . Colon polyps Sister     Family history  . Diabetes Maternal Uncle   . Stroke Paternal Uncle     Vitals:   10/31/15 1116  BP: 110/72  Pulse: 78  SpO2: 97%  Weight: 190 lb 12 oz (86.5 kg)   Wt Readings from Last 3 Encounters:  10/31/15 190 lb 12 oz (86.5 kg)  10/03/15 185 lb (83.9 kg)  09/02/15 187 lb 9.6 oz (85.1 kg)     PHYSICAL EXAM: General: Looks great.  No resp difficulty HEENT: normal Neck: supple. JVP 6. Carotids 2+ bilat; no bruits. No thyromegaly or nodule noted Cor: PMI nondisplaced.  RRR. 2/6 TR Lungs: CTAB, normal effort Abdomen: soft, NT, ND, no HSM noted. No bruits or masses. +BS Extremities: no cyanosis, clubbing, rash, edema Neuro: alert & orientedx3, cranial nerves grossly intact. moves all 4 extremities w/o difficulty. Affect pleasant  ASSESSMENT  1. 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 RV failure 3. Pulmonary HTN 4. 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 DM2 6. CAD s/p CABG 7. AF s/p Maze   --Now NSR. on coumadin 8. AFL s/p ablation 10/15 9. CKD, stage 3 10. HTN  PLAN  Looks great. Volume status much improved. Continue sliding-scale diuretic regimen. Check labs today.  Much improved. Now euvolemic. Echo from 1/17show PA pressures down 50% from previous. RV dilation expected due to high R-side flow from anomalous PV. Will continue current regimen. Remains on coumadin for PAF has tried Eliquis in past but had too much bleeding. Encouraged to remain active. RTC in 4 months with ECHO.    Kharson Rasmusson,MD 11:42 AM

## 2015-11-01 IMAGING — CT CT CTA ABD/PEL W/CM AND/OR W/O CM
1 of 6 series · 12 of 46 positions shown, 17 images · IV contrast (80CC OMNI 350)
Comparison: None.

CLINICAL DATA: Atrial fibrillation, left chest pain, prior smoker

EXAM:
CT ANGIOGRAPHY CHEST, ABDOMEN AND PELVIS
TECHNIQUE: Multidetector CT imaging through the chest, abdomen and pelvis was
performed using the standard protocol during bolus administration of
intravenous contrast. Multiplanar reconstructed images including
MIPs were obtained and reviewed to evaluate the vascular anatomy.
CONTRAST:  80mL OMNIPAQUE IOHEXOL 350 MG/ML SOLN

[Series 4: angio · axial · 0.84mm/px · z∈[-601,-6]mm · 12 of 273 slices shown, 17 images]
[im 18/273  soft-tissue]
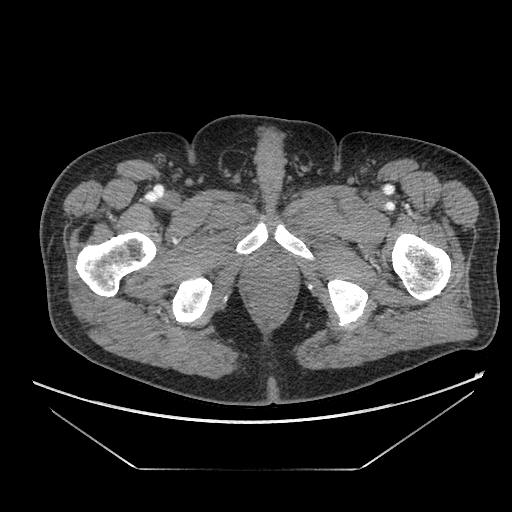
[im 18/273  bone]
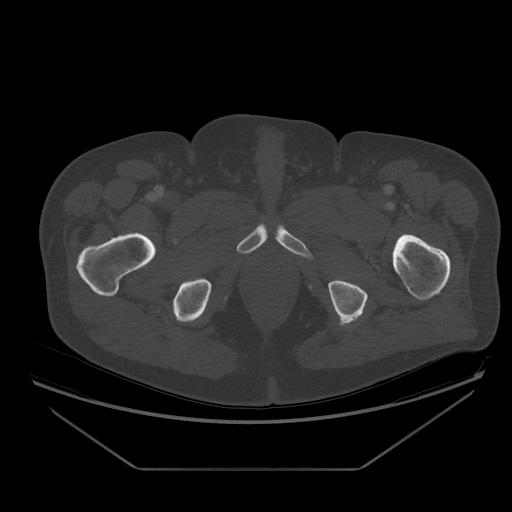
[im 52/273  soft-tissue]
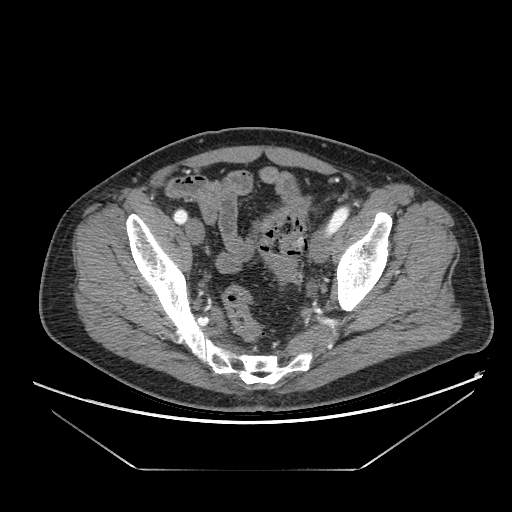
[im 69/273  soft-tissue]
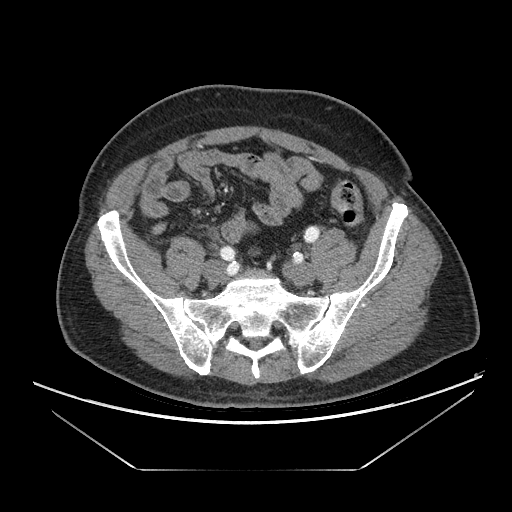
[im 86/273  soft-tissue]
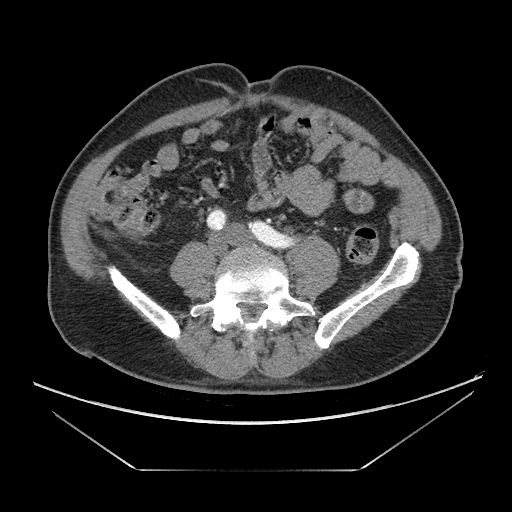
[im 120/273  soft-tissue]
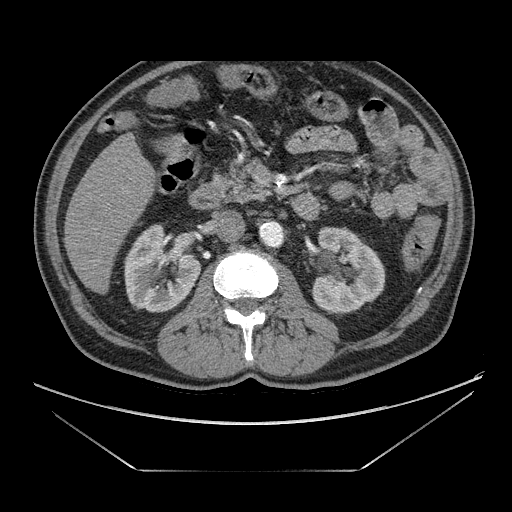
[im 137/273  soft-tissue]
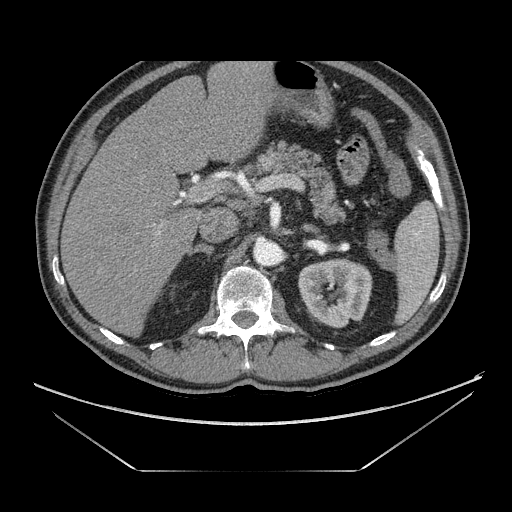
[im 154/273  soft-tissue]
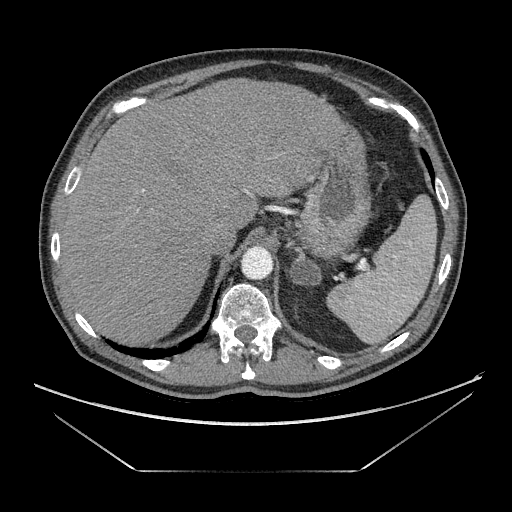
[im 188/273  soft-tissue]
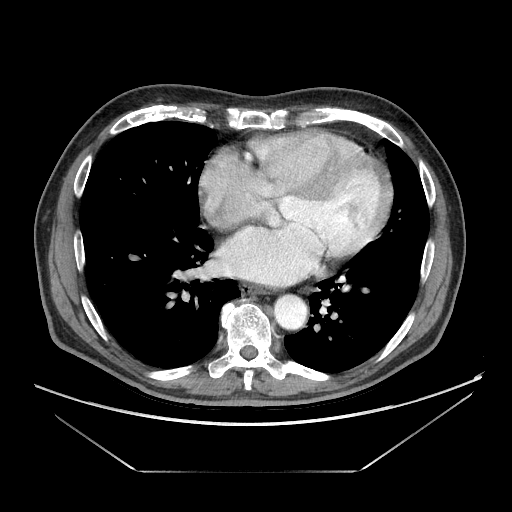
[im 205/273  soft-tissue]
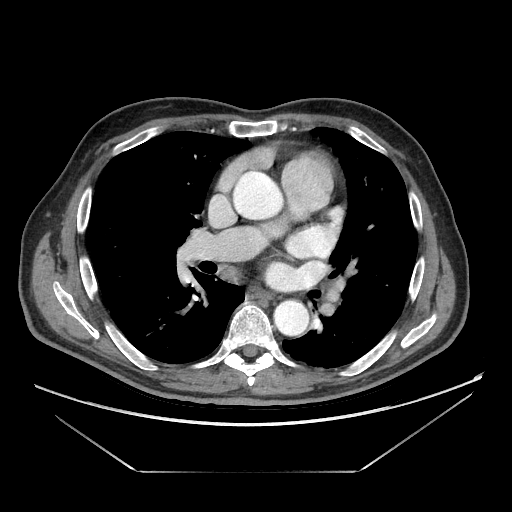
[im 205/273  lung]
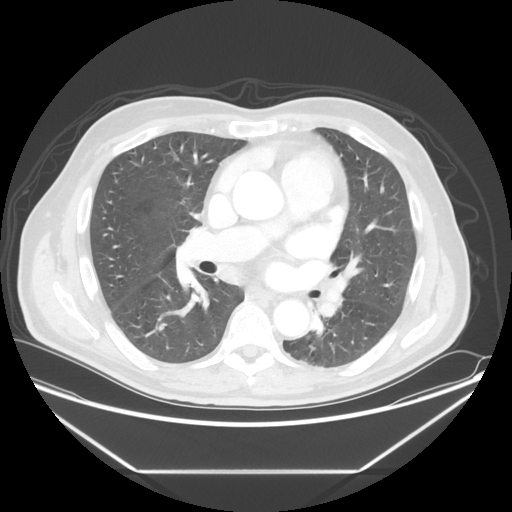
[im 205/273  bone]
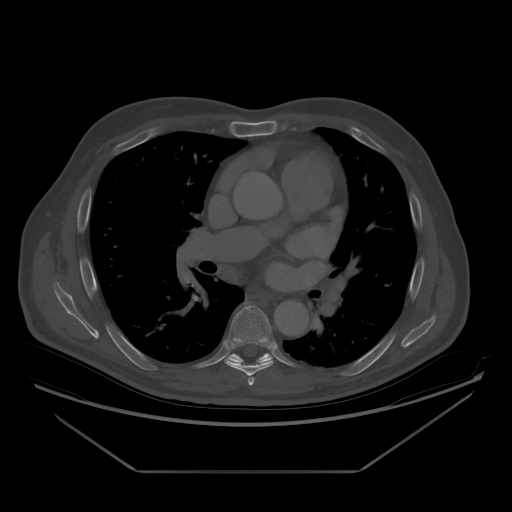
[im 222/273  soft-tissue]
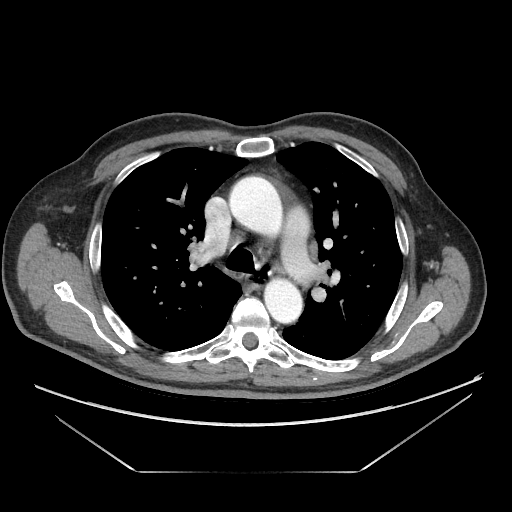
[im 222/273  lung]
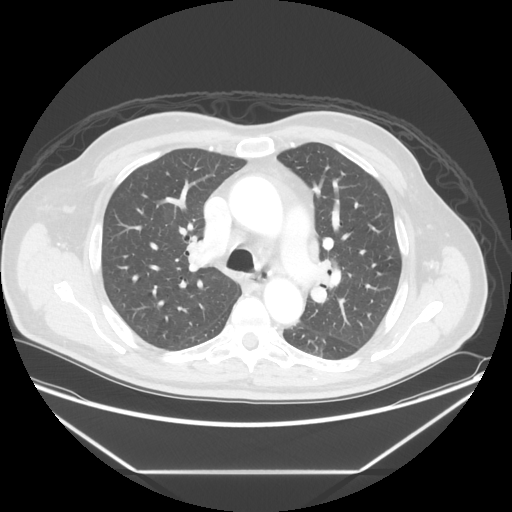
[im 239/273  lung]
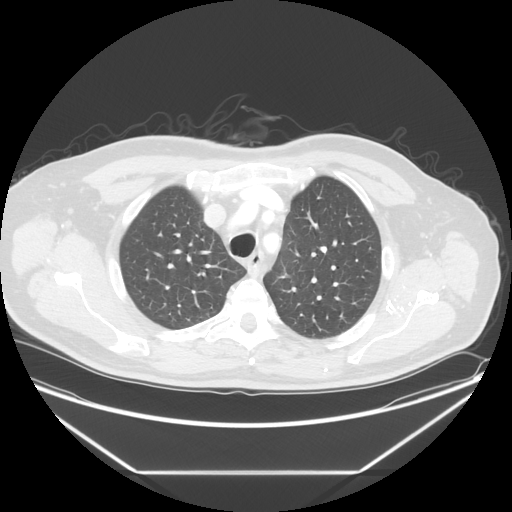
[im 256/273  soft-tissue]
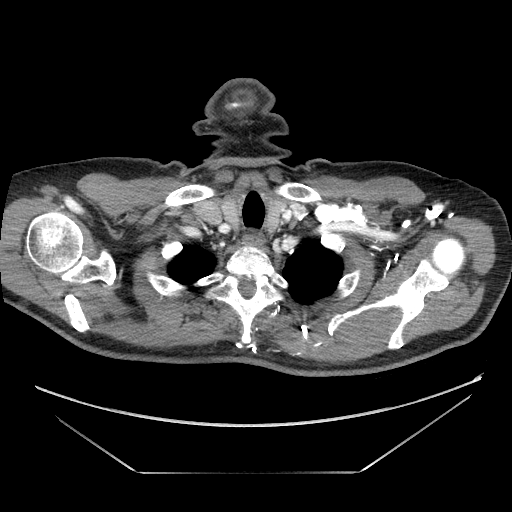
[im 256/273  lung]
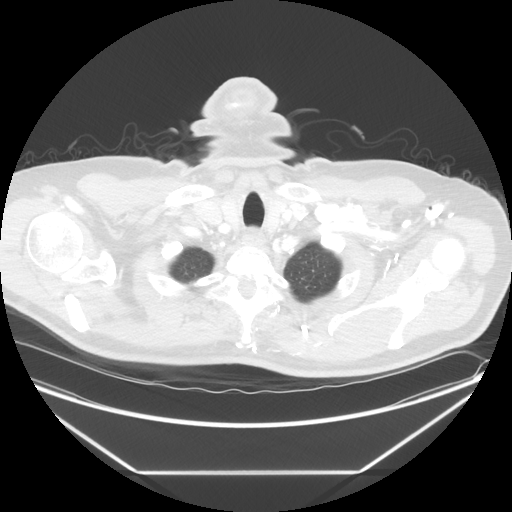

[12 of 46 positions shown; findings below may reference images not displayed]

FINDINGS: CTA CHEST FINDINGS

Atherosclerotic changes of the thoracic aorta. Initial noncontrast
phase demonstrates No hyperdense intramural hemorrhage within the
thoracic aorta. Postcontrast, the thoracic aorta is patent as well
as the major branch vessels. Negative for significant aneurysm or
dissection. No mediastinal hemorrhage or hematoma. Mild cardiac
enlargement. Coronary calcifications noted. No pericardial or
pleural effusion. Mildly prominent subcarinal lymph nodes and
prevascular lymph nodes. No significant adenopathy or pathologically
enlarged nodes appreciated.

Lung windows demonstrate clear lungs. No airspace process, collapse
or consolidation. No interstitial change or edema. Trachea and
central airways are patent. No suspicious pulmonary nodule or mass.

Review of the MIP images confirms the above findings.

CTA ABDOMEN AND PELVIS FINDINGS

Abdominal aortic atherosclerosis noted with mild tortuosity.
Negative for aneurysm, dissection or occlusion. No retroperitoneal
hemorrhage or hematoma. Celiac, SMA, main renal arteries, accessory
left lower pole renal artery, and IMA are all patent. Aorta
bifurcation is patent without occlusive disease.

Iliac atherosclerosis noted with mild tortuosity. Common, internal
and external iliac arteries remain patent throughout the pelvis.
Negative for iliac occlusion, dissection or aneurysm. Visualized
common femoral, proximal superficial femoral and proximal profunda
femoral branches are all patent in the inguinal regions.

Nonvascular: Liver, gallbladder, biliary system, pancreas, spleen,
right adrenal gland, and kidneys are stable and within normal limits
for age. Left adrenal gland demonstrates a stable hypodense 2.5 cm
nodule compatible with an adrenal adenoma. No interval change.
Negative bowel obstruction, dilatation, ileus, or free air. No
abdominal free fluid, fluid collection, hemorrhage, abscess, or
adenopathy.

Appendix is unremarkable. Extensive sigmoid diverticulosis with
chronic wall thickening. No definite acute inflammatory process to
suggest diverticulitis. No pelvic free fluid, fluid collection,
hemorrhage, abscess, adenopathy, or significant inguinal
abnormality.

Degenerative changes of the spine and pelvis. No acute osseous
finding.

Review of the MIP images confirms the above findings.
IMPRESSION: CTA CHEST IMPRESSION

Negative for thoracic aorta dissection. Thoracic aortic
atherosclerosis and coronary atherosclerosis.

No acute intra thoracic finding

CTA ABDOMEN AND PELVIS IMPRESSION

Negative for abdominal aortic or iliac dissection. Abdominal aortic
atherosclerosis diffusely.

Negative for mesenteric or renal vascular occlusive process.

No iliac occlusion or inflow disease.

Stable 2.5 cm and left adrenal adenomas

Extensive sigmoid diverticulosis

## 2015-11-08 ENCOUNTER — Encounter (INDEPENDENT_AMBULATORY_CARE_PROVIDER_SITE_OTHER): Payer: Medicare Other | Admitting: Ophthalmology

## 2015-11-08 DIAGNOSIS — H35033 Hypertensive retinopathy, bilateral: Secondary | ICD-10-CM | POA: Diagnosis not present

## 2015-11-08 DIAGNOSIS — H43813 Vitreous degeneration, bilateral: Secondary | ICD-10-CM | POA: Diagnosis not present

## 2015-11-08 DIAGNOSIS — H353121 Nonexudative age-related macular degeneration, left eye, early dry stage: Secondary | ICD-10-CM | POA: Diagnosis not present

## 2015-11-08 DIAGNOSIS — H338 Other retinal detachments: Secondary | ICD-10-CM | POA: Diagnosis not present

## 2015-11-08 DIAGNOSIS — I10 Essential (primary) hypertension: Secondary | ICD-10-CM

## 2015-11-08 DIAGNOSIS — H34832 Tributary (branch) retinal vein occlusion, left eye, with macular edema: Secondary | ICD-10-CM | POA: Diagnosis not present

## 2015-11-12 ENCOUNTER — Encounter: Payer: Self-pay | Admitting: Internal Medicine

## 2015-11-12 ENCOUNTER — Ambulatory Visit (INDEPENDENT_AMBULATORY_CARE_PROVIDER_SITE_OTHER): Payer: Medicare Other | Admitting: Internal Medicine

## 2015-11-12 VITALS — BP 102/64 | HR 76 | Temp 98.0°F | Resp 16 | Ht 71.0 in | Wt 192.0 lb

## 2015-11-12 DIAGNOSIS — J069 Acute upper respiratory infection, unspecified: Secondary | ICD-10-CM

## 2015-11-12 MED ORDER — AZELASTINE HCL 0.1 % NA SOLN: 2.0000 | Freq: Two times a day (BID) | NASAL | 2 refills | Status: DC

## 2015-11-12 MED ORDER — DEXAMETHASONE SODIUM PHOSPHATE 10 MG/ML IJ SOLN
10.0000 mg | Freq: Once | INTRAMUSCULAR | Status: AC
  Administered 2015-11-12: 10 mg via INTRAMUSCULAR

## 2015-11-12 MED ORDER — AZITHROMYCIN 250 MG PO TABS: ORAL_TABLET | ORAL | 0 refills | Status: DC

## 2015-11-12 NOTE — Progress Notes (Signed)
HPI  Patient presents to the office for evaluation of cough, congestion, and sore throat.  It has been going on for 3 days.  Patient reports dry throat clearing cough.  They also endorse change in voice, postnasal drip and sore throat.  .  They have tried hydrocodone and benadryl and robitussin.  They report that nothing has worked.  They denies other sick contacts.  He does take claritin daily and flonase daily.  He reports that he did run out of the flonase briefly.    Blood sugars are running in the 180s or less.  He reports that generally they do a little bit better than this.    Review of Systems  Constitutional: Positive for malaise/fatigue. Negative for chills and fever.  HENT: Positive for congestion, ear pain, hearing loss and sore throat.   Respiratory: Positive for cough. Negative for sputum production, shortness of breath and wheezing.   Cardiovascular: Negative for chest pain, palpitations and leg swelling.  Neurological: Positive for headaches.    PE:  Vitals:   11/12/15 0945  BP: 102/64  Pulse: 76  Resp: 16  Temp: 98 F (36.7 C)   General:  Alert and non-toxic, WDWN, NAD HEENT: NCAT, PERLA, EOM normal, no occular discharge or erythema.  Nasal mucosal edema with sinus tenderness to palpation.  Oropharynx clear with minimal oropharyngeal edema and erythema.  Mucous membranes moist and pink. Neck:  Cervical adenopathy Chest:  RRR no MRGs.  Lungs clear to auscultation A&P with no wheezes rhonchi or rales.   Abdomen: +BS x 4 quadrants, soft, non-tender, no guarding, rigidity, or rebound. Skin: warm and dry no rash Neuro: A&Ox4, CN II-XII grossly intact  Assessment and Plan:   1. Acute URI -astelin -cont flonase -cont daily antihistamine -cont ranitidine BID -zpak if fevers or worsening signs of infection -patient declined cough medication - dexamethasone (DECADRON) injection 10 mg; Inject 1 mL (10 mg total) into the muscle once.

## 2015-11-19 ENCOUNTER — Telehealth: Payer: Self-pay | Admitting: Pulmonary Disease

## 2015-11-19 NOTE — Telephone Encounter (Signed)
atc [employer] regarding forms, was placed on hold over 10 minutes.  Wcb.  *please note that I have not received any forms on this patient.

## 2015-11-21 NOTE — Telephone Encounter (Signed)
Attempted to call the number listed but could not get through.  Will try back later.

## 2015-11-24 ENCOUNTER — Other Ambulatory Visit: Payer: Self-pay | Admitting: Physician Assistant

## 2015-11-25 NOTE — Telephone Encounter (Signed)
Attempted to call the number provided. I received a fast busy signal x3. Will try back later.

## 2015-11-27 NOTE — Telephone Encounter (Signed)
We have attempted to contact [name] on several occassions with no success. Per triage protocol, message will be closed.

## 2015-12-01 ENCOUNTER — Other Ambulatory Visit: Payer: Self-pay | Admitting: Internal Medicine

## 2015-12-03 ENCOUNTER — Ambulatory Visit (INDEPENDENT_AMBULATORY_CARE_PROVIDER_SITE_OTHER): Payer: Medicare Other | Admitting: Internal Medicine

## 2015-12-03 ENCOUNTER — Encounter: Payer: Self-pay | Admitting: Internal Medicine

## 2015-12-03 VITALS — BP 116/68 | HR 68 | Temp 97.5°F | Resp 16 | Ht 71.0 in | Wt 190.0 lb

## 2015-12-03 DIAGNOSIS — I10 Essential (primary) hypertension: Secondary | ICD-10-CM

## 2015-12-03 DIAGNOSIS — I2510 Atherosclerotic heart disease of native coronary artery without angina pectoris: Secondary | ICD-10-CM | POA: Diagnosis not present

## 2015-12-03 DIAGNOSIS — E782 Mixed hyperlipidemia: Secondary | ICD-10-CM

## 2015-12-03 DIAGNOSIS — Z79899 Other long term (current) drug therapy: Secondary | ICD-10-CM

## 2015-12-03 DIAGNOSIS — I480 Paroxysmal atrial fibrillation: Secondary | ICD-10-CM

## 2015-12-03 DIAGNOSIS — N183 Chronic kidney disease, stage 3 (moderate): Secondary | ICD-10-CM

## 2015-12-03 DIAGNOSIS — E559 Vitamin D deficiency, unspecified: Secondary | ICD-10-CM | POA: Diagnosis not present

## 2015-12-03 DIAGNOSIS — E1122 Type 2 diabetes mellitus with diabetic chronic kidney disease: Secondary | ICD-10-CM | POA: Diagnosis not present

## 2015-12-03 DIAGNOSIS — Z7901 Long term (current) use of anticoagulants: Secondary | ICD-10-CM

## 2015-12-03 LAB — CBC WITH DIFFERENTIAL/PLATELET
Basophils Absolute: 63 cells/uL (ref 0–200)
Basophils Relative: 1 %
EOS ABS: 126 {cells}/uL (ref 15–500)
Eosinophils Relative: 2 %
HEMATOCRIT: 37.8 % — AB (ref 38.5–50.0)
Hemoglobin: 12.6 g/dL — ABNORMAL LOW (ref 13.2–17.1)
LYMPHS PCT: 12 %
Lymphs Abs: 756 cells/uL — ABNORMAL LOW (ref 850–3900)
MCH: 31.9 pg (ref 27.0–33.0)
MCHC: 33.3 g/dL (ref 32.0–36.0)
MCV: 95.7 fL (ref 80.0–100.0)
MONO ABS: 504 {cells}/uL (ref 200–950)
MPV: 8.7 fL (ref 7.5–12.5)
Monocytes Relative: 8 %
NEUTROS PCT: 77 %
Neutro Abs: 4851 cells/uL (ref 1500–7800)
Platelets: 173 10*3/uL (ref 140–400)
RBC: 3.95 MIL/uL — ABNORMAL LOW (ref 4.20–5.80)
RDW: 13.7 % (ref 11.0–15.0)
WBC: 6.3 10*3/uL (ref 3.8–10.8)

## 2015-12-03 LAB — HEPATIC FUNCTION PANEL
ALBUMIN: 4.3 g/dL (ref 3.6–5.1)
ALK PHOS: 61 U/L (ref 40–115)
ALT: 20 U/L (ref 9–46)
AST: 24 U/L (ref 10–35)
Bilirubin, Direct: 0.1 mg/dL (ref <=0.2)
Indirect Bilirubin: 0.6 mg/dL (ref 0.2–1.2)
TOTAL PROTEIN: 7.2 g/dL (ref 6.1–8.1)
Total Bilirubin: 0.7 mg/dL (ref 0.2–1.2)

## 2015-12-03 LAB — TSH: TSH: 1.43 m[IU]/L (ref 0.40–4.50)

## 2015-12-03 LAB — LIPID PANEL
Cholesterol: 129 mg/dL (ref <200)
HDL: 23 mg/dL — ABNORMAL LOW (ref >40)
LDL CALC: 49 mg/dL (ref <100)
TRIGLYCERIDES: 287 mg/dL — AB (ref <150)
Total CHOL/HDL Ratio: 5.6 Ratio — ABNORMAL HIGH (ref <5.0)
VLDL: 57 mg/dL — ABNORMAL HIGH (ref <30)

## 2015-12-03 LAB — BASIC METABOLIC PANEL WITH GFR
BUN: 40 mg/dL — AB (ref 7–25)
CO2: 32 mmol/L — AB (ref 20–31)
Calcium: 9.1 mg/dL (ref 8.6–10.3)
Chloride: 100 mmol/L (ref 98–110)
Creat: 1.72 mg/dL — ABNORMAL HIGH (ref 0.70–1.18)
GFR, EST AFRICAN AMERICAN: 44 mL/min — AB (ref >=60)
GFR, Est Non African American: 38 mL/min — ABNORMAL LOW (ref >=60)
GLUCOSE: 218 mg/dL — AB (ref 65–99)
POTASSIUM: 3.4 mmol/L — AB (ref 3.5–5.3)
Sodium: 144 mmol/L (ref 135–146)

## 2015-12-03 LAB — MAGNESIUM: Magnesium: 2.2 mg/dL (ref 1.5–2.5)

## 2015-12-03 NOTE — Progress Notes (Signed)
[name] [name], M.D.        Uvaldo Bristle. Silverio Lay, P.A.-C       Starlyn Skeans, P.A.-C  [facility]                [address] [place], [place] [address] Telephone [phone] Telefax [phone] ______________________________________________________________________     This very nice 75 y.o. 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 Patient also presents for Coumadin surveillance. Patient reports compliance with CPAP and improved sleep hygiene and O2 sats usu ranging 96% at rest and may drop to 93 with walking.      Patient is treated for HTN circa  1980 - s/p CABG 7 MAZE 2015 & also hx RFA of AFL & is followed closely by Dr Missy Sabins at the [facility]. BP has been controlled at home. Today's BP: 116/68. Patient has had no complaints of any cardiac type chest pain, palpitations, dyspnea/orthopnea/PND, dizziness, claudication, or dependent edema. Patient is on Coumadin for his hx/o pAfib.      Hyperlipidemia is controlled with diet & meds. Patient denies myalgias or other med SE's. Last Lipids were at goal albeit elevated Trig's: Lab Results  Component Value Date   CHOL 137 05/27/2015   HDL 26 (L) 05/27/2015   LDLCALC 67 05/27/2015   TRIG 219 (H) 05/27/2015   CHOLHDL 5.3 (H) 05/27/2015      Also, the patient has history of T2_NIDDM circa 1995 with CKD3 on therapy with Trulicity weekly, Metformin and Glimepiride.  Patient admits poor diet compliance, with CBG's ranging in the 120-200's range. He denies  symptoms of reactive hypoglycemia, diabetic polys, paresthesias or visual blurring.  Last A1c was still not at goal: Lab Results  Component Value Date   HGBA1C 8.3 (H) 05/27/2015      Further, the patient also has history of Vitamin D Deficiency in 2008 of "39" on  supplements and supplements vitamin D without any suspected side-effects. Last vitamin D was at goal: Lab Results  Component Value Date   VD25OH 64 05/27/2015   Current Outpatient Prescriptions on File Prior to Visit  Medication Sig  . ALPRAZolam 1 MG tablet Take 0.5 tablets  at bedtime as needed for sleep.  [name] atorvastatin  40 MG tablet TAKE 1 TAB EVERY DAY - takes 1/2 tab=20 mg  3 x/week   . ASTELIN 0.1 % nasal spray  2 sprays into  nostril 2 x/da  . VITAMIN B COMPLEX  Take 1 tablet by mouth daily.  . Carvedilol 6.25 MG tablet TAKE 1 TAB 2  TIMES DAILY.  [name] VITAMIN D  Takes 5,000 u daily   . diphenhydrAMINE  25 MG  Take 25 mg by mouth at bedtime as needed for allergies.   [name] FLONASE nasal spray Place into both nostrils daily.  [name] glimepiride  4 MG tablet Take 4 mg by mouth 2 (two) times daily.  . NORCO 5-325 MG tablet Take 1 tab every 6 (six) hrs as needed   . loratadine  10 MG tablet Take 10 mg by mouth daily.  . metFORMIN-XR 500 MG TAKE 2 TAB TWO TIMES DAILY  . metolazone  5 MG tablet Take 5 mg by mouth daily as needed   . MULTIVIT W/ MINERALS Take 1 tablet by mouth daily.  [name]  PRESERVISION AREDS 2 Take by mouth 2 (two) times daily.  [name] K-DUR 20 MEQ tablet Take 1 tablet (20 mEq total) by mouth 2 (two) times daily.  . ranitidine  150 MG tablet Take 150 mg by mouth daily as needed for heartburn.   . sertraline  100 MG tablet TAKE 1 TABLET BY MOUTH DAILY  . torsemide  100 MG tablet Take 100 mg in AM and 50 mg in PM if needed for weight gain  . TRULICITY  INJECT 1 PEN INTO THE SKIN ONCE A WEEK.  . valsartan  320 MG tablet Take 160 mg by mouth daily.  [name] warfarin  5 MG tablet TAKE 1 TABLET BY MOUTH TWICE A DAY (10 mg)    Allergies  Allergen Reactions  . Sunflower Seed [Sunflower Oil] Swelling and Other (See Comments)    Tongue and lip swelling  . Horse-Derived Products Other (See Comments)    Per allergy skin test  . Tetanus Toxoids Other (See Comments)    Per allergy skin test   PMHx:    Past Medical History:  Diagnosis Date  . Adrenal adenoma   . Anxiety   . Atypical atrial flutter ([place]) 8/15, 10/15   a. DCCV 08/2013. b. s/p RFA 10/2013.  [name] Basal cell carcinoma   . CAD (coronary artery disease)    a. 04/2013 CABG x 2: LIMA to LAD, SVG to RI, EVH via R thigh.  . Chronic diastolic congestive heart failure ([place])   . Depression   . Diabetes mellitus type II   . Diverticulosis 2001  . DJD (degenerative joint disease)   . GERD (gastroesophageal reflux disease)   . H/O hiatal hernia   . History of cardioversion    x3 (years uncertain)  . Hx of adenomatous colonic polyps   . Hyperlipidemia   . Hypertension   . Hypertensive cardiomyopathy ([place])   . Obstructive sleep apnea    compliant with CPAP  . 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 Persistent atrial fibrillation ([place])    a. s/p MAZE 04/2013 in setting of CABG. b. Amio stopped in 10/2013 after flutter ablation.  [name] PFO (patent foramen ovale)    a. Small PFO by TEE 10/2013.  [name] Pleural effusion, left    a. s/p thoracentesis 05/2013.  [name] Respiratory failure ([place])    a. Hypoxia 10/2013 - required supp O2 as inpatient, did not require it at discharge.  . S/P Maze operation for atrial fibrillation    a. 04/2013: Complete bilateral atrial lesion set using cryothermy and bipolar radiofrequency ablation with clipping of LA appendage (@ time of CABG)  . S/P Maze operation for atrial fibrillation 04/05/2013   Complete bilateral atrial lesion set using cryothermy and bipolar radiofrequency ablation with clipping of LA appendage via median sternotomy approach    Immunization History  Administered Date(s) Administered  . DT 08/05/2015  . Influenza Split 10/25/2012  . Influenza, High Dose Seasonal PF 09/26/2013, 09/27/2014, 10/03/2015  . Pneumococcal Conjugate-13 01/30/2014  . Pneumococcal Polysaccharide-23 10/20/2011  . Td  01/06/2000   Past Surgical History:  Procedure Laterality Date  . ATRIAL FIBRILLATION ABLATION N/A 10/26/2013   Procedure: ATRIAL FIBRILLATION ABLATION;  Surgeon: Coralyn Mark, MD;  Location: [facility] CATH LAB;  Service: Cardiovascular;  Laterality: N/A;  . BASAL CELL CARCINOMA EXCISION     x3 on face  . CARDIAC CATHETERIZATION     myocardial bridge  but no cad  . CARDIOVERSION N/A 08/23/2013   Procedure: CARDIOVERSION;  Surgeon: Sanda Klein, MD;  Location: [facility];  Service: Cardiovascular;  Laterality: N/A;  . CARPOMETACARPEL SUSPENSION PLASTY Left 02/14/2014   Procedure: CARPOMETACARPEL ([place]) SUSPENSIONPLASTY THUMB  WITH  ABDUCTOR POLLICIS LONGUS TRANSFER AND STENOSING TENOSYNOVITIS RELEASE LEFT WRIST;  Surgeon: Charlotte Crumb, MD;  Location: [facility];  Service: Orthopedics;  Laterality: Left;  . CATARACT EXTRACTION     bilateral  . CORONARY ARTERY BYPASS GRAFT N/A 04/05/2013   Procedure: CORONARY ARTERY BYPASS GRAFTING (CABG) TIMES TWO USING LEFT INTERNAL MAMMARY ARTERY AND RIGHT SAPHENOUS LEG VEIN HARVESTED ENDOSCOPICALLY;  Surgeon: Rexene Alberts, MD;  Location: [facility];  Service: Open Heart Surgery;  Laterality: N/A;  . GREAT TOE ARTHRODESIS, INTERPHALANGEAL JOINT     Right foot  . INTRAOPERATIVE TRANSESOPHAGEAL ECHOCARDIOGRAM N/A 04/05/2013   Procedure: INTRAOPERATIVE TRANSESOPHAGEAL ECHOCARDIOGRAM;  Surgeon: Rexene Alberts, MD;  Location: [facility];  Service: Open Heart Surgery;  Laterality: N/A;  . LEFT HEART CATHETERIZATION WITH CORONARY ANGIOGRAM N/A 03/07/2013   Procedure: LEFT HEART CATHETERIZATION WITH CORONARY ANGIOGRAM;  Surgeon: Burnell Blanks, MD;  Location[facility] CATH LAB;  Service: Cardiovascular;  Laterality: N/A;  . MAZE N/A 04/05/2013   Procedure: MAZE;  Surgeon: Rexene Alberts, MD;  Location: [facility];  Service: Open Heart Surgery;  Laterality: N/A;  . Polinydal cyst     Removed  . POLYPECTOMY    . Retina repair-right    . RIGHT HEART CATHETERIZATION  N/A 05/03/2014   Procedure: RIGHT HEART CATH;  Surgeon: Jolaine Artist, MD;  Location: [facility] CATH LAB;  Service: Cardiovascular;  Laterality: N/A;  . TEE WITHOUT CARDIOVERSION N/A 08/23/2013   Procedure: TRANSESOPHAGEAL ECHOCARDIOGRAM (TEE);  Surgeon: Sanda Klein, MD;  Location[facility] ENDOSCOPY;  Service: Cardiovascular;  Laterality: N/A;  . TEE WITHOUT CARDIOVERSION N/A 10/26/2013   Procedure: TRANSESOPHAGEAL ECHOCARDIOGRAM (TEE);  Surgeon: Sueanne Margarita, MD;  Location: [facility] ENDOSCOPY;  Service: Cardiovascular;  Laterality: N/A;  . TEE WITHOUT CARDIOVERSION N/A 06/05/2014   Procedure: TRANSESOPHAGEAL ECHOCARDIOGRAM (TEE);  Surgeon: Thayer Headings, MD;  Location: [facility] ENDOSCOPY;  Service: Cardiovascular;  Laterality: N/A;  . TRAPEZIUM RESECTION     FHx:    Reviewed / unchanged  SHx:    Reviewed / unchanged  Systems Review:  Constitutional: Denies fever, chills, wt changes, headaches, insomnia, fatigue, night sweats, change in appetite. Eyes: Denies redness, blurred vision, diplopia, discharge, itchy, watery eyes.  ENT: Denies discharge, congestion, post nasal drip, epistaxis, sore throat, earache, hearing loss, dental pain, tinnitus, vertigo, sinus pain, snoring.  CV: Denies chest pain, palpitations, irregular heartbeat, syncope, dyspnea, diaphoresis, orthopnea, PND, claudication or edema. Respiratory: denies cough, dyspnea, DOE, pleurisy, hoarseness, laryngitis, wheezing.  Gastrointestinal: Denies dysphagia, odynophagia, heartburn, reflux, water brash, abdominal pain or cramps, nausea, vomiting, bloating, diarrhea, constipation, hematemesis, melena, hematochezia  or hemorrhoids. Genitourinary: Denies dysuria, frequency, urgency, nocturia, hesitancy, discharge, hematuria or flank pain. Musculoskeletal: Denies arthralgias, myalgias, stiffness, jt. swelling, pain, limping or strain/sprain.  Skin: Denies pruritus, rash, hives, warts, acne, eczema or change in skin lesion(s). Neuro: No weakness,  tremor, incoordination, spasms, paresthesia or pain. Psychiatric: Denies confusion, memory loss or sensory loss. Endo: Denies change in weight, skin or hair change.  Heme/Lymph: No excessive bleeding, bruising or enlarged lymph nodes.  Physical Exam BP 116/68   Pulse 68   Temp 97.5 F (36.4 C)   Resp 16   Ht 5\' 11"  (1.803 m)   Wt 190 lb (86.2  kg)   BMI 26.50 kg/m   Appears well nourished and in no distress.  Eyes: PERRLA, EOMs, conjunctiva no swelling or erythema. Sinuses: No frontal/maxillary tenderness ENT/Mouth: EAC's clear, TM's nl w/o erythema, bulging. Nares clear w/o erythema, swelling, exudates. Oropharynx clear without erythema or exudates. Oral hygiene is good. Tongue normal, non obstructing. Hearing intact.  Neck: Supple. Thyroid nl. Car 2+/2+ without bruits, nodes or JVD. Chest: Respirations nl with BS clear & equal w/o rales, rhonchi, wheezing or stridor.  Cor: Heart sounds normal w/ regular rate and rhythm without sig. murmurs, gallops, clicks, or rubs. Peripheral pulses normal and equal  without edema.  Abdomen: Soft & bowel sounds normal. Non-tender w/o guarding, rebound, hernias, masses, or organomegaly.  Lymphatics: Unremarkable.  Musculoskeletal: Full ROM all peripheral extremities, joint stability, 5/5 strength, and normal gait.  Skin: Warm, dry without exposed rashes, lesions or ecchymosis apparent.  Neuro: Cranial nerves intact, reflexes equal bilaterally. Sensory-motor testing grossly intact. Tendon reflexes grossly intact.  Pysch: Alert & oriented x 3.  Insight and judgement nl & appropriate. No ideations.  Assessment and Plan:   1. Essential hypertension  - Continue medication, monitor blood pressure at home.  - Continue DASH diet. Reminder to go to the ER if any CP,  SOB, nausea, dizziness, severe HA, changes vision/speech,  left arm numbness and tingling and jaw pain. - TSH  2. Hyperlipidemia  - Continue diet/meds, exercise,& lifestyle  modifications.  - Continue monitor periodic cholesterol/liver & renal functions  - Lipid panel - TSH  3. Controlled type 2 diabetes mellitus with stage 3 chronic kidney disease (HCC)  - Continue diet, exercise, lifestyle modifications. Monitor appropriate labs. - Hemoglobin A1c - Insulin, random  4. Vitamin D deficiency  - Continue supplementation. - VITAMIN D 25 Hydroxy   5. Coronary artery disease    6. PAF (paroxysmal atrial fibrillation) ([place])  - Protime-INR  7. Chronic anticoagulation  - Protime-INR  8. Medication management   - CBC with Differential/Platelet - BASIC METABOLIC PANEL WITH GFR - Hepatic function panel - Magnesium      Recommended regular exercise, BP monitoring, weight control, and discussed med and SE's. Recommended labs to assess and monitor clinical status. Further disposition pending results of labs. Over 30 minutes of exam, counseling, chart review was performed

## 2015-12-03 NOTE — Patient Instructions (Signed)

## 2015-12-04 ENCOUNTER — Other Ambulatory Visit: Payer: Self-pay | Admitting: *Deleted

## 2015-12-04 LAB — PROTIME-INR
INR: 2.5 — AB
Prothrombin Time: 25.4 s — ABNORMAL HIGH (ref 9.0–11.5)

## 2015-12-04 LAB — HEMOGLOBIN A1C
HEMOGLOBIN A1C: 8 % — AB (ref <5.7)
MEAN PLASMA GLUCOSE: 183 mg/dL

## 2015-12-04 LAB — VITAMIN D 25 HYDROXY (VIT D DEFICIENCY, FRACTURES): Vit D, 25-Hydroxy: 64 ng/mL (ref 30–100)

## 2015-12-04 LAB — INSULIN, RANDOM: INSULIN: 6.6 u[IU]/mL (ref 2.0–19.6)

## 2015-12-04 MED ORDER — GLIMEPIRIDE 4 MG PO TABS: ORAL_TABLET | ORAL | 1 refills | Status: DC

## 2015-12-06 ENCOUNTER — Telehealth: Payer: Self-pay | Admitting: Pulmonary Disease

## 2015-12-06 NOTE — Telephone Encounter (Signed)
lmtcb X1 for [name]. These forms have been received and I'm working with Kathlee Nations as to why this needs to be filled out.  This is a billing form.

## 2015-12-12 ENCOUNTER — Ambulatory Visit (HOSPITAL_COMMUNITY)
Admission: RE | Admit: 2015-12-12 | Discharge: 2015-12-12 | Disposition: A | Discharge status: Home / Self Care | Payer: Medicare Other | Source: Ambulatory Visit | Attending: Internal Medicine | Admitting: Internal Medicine

## 2015-12-12 ENCOUNTER — Other Ambulatory Visit: Payer: Self-pay

## 2015-12-12 ENCOUNTER — Ambulatory Visit: Payer: Self-pay | Admitting: *Deleted

## 2015-12-12 DIAGNOSIS — I2510 Atherosclerotic heart disease of native coronary artery without angina pectoris: Secondary | ICD-10-CM | POA: Insufficient documentation

## 2015-12-12 DIAGNOSIS — I272 Pulmonary hypertension, unspecified: Secondary | ICD-10-CM | POA: Insufficient documentation

## 2015-12-12 DIAGNOSIS — E119 Type 2 diabetes mellitus without complications: Secondary | ICD-10-CM | POA: Diagnosis not present

## 2015-12-12 DIAGNOSIS — I110 Hypertensive heart disease with heart failure: Secondary | ICD-10-CM | POA: Diagnosis not present

## 2015-12-12 DIAGNOSIS — I5022 Chronic systolic (congestive) heart failure: Secondary | ICD-10-CM | POA: Diagnosis not present

## 2015-12-12 DIAGNOSIS — I340 Nonrheumatic mitral (valve) insufficiency: Secondary | ICD-10-CM | POA: Diagnosis not present

## 2015-12-12 DIAGNOSIS — I509 Heart failure, unspecified: Secondary | ICD-10-CM | POA: Diagnosis present

## 2015-12-12 DIAGNOSIS — I77810 Thoracic aortic ectasia: Secondary | ICD-10-CM | POA: Diagnosis not present

## 2015-12-12 NOTE — Progress Notes (Signed)
Echocardiogram 2D Echocardiogram has been performed.  Aggie Cosier 12/12/2015, 11:47 AM

## 2015-12-12 NOTE — Patient Outreach (Signed)
[facility]) Care Management  12/12/2015  ARIZONA NORDQUIST [birth year] [phone]   Telephone call to patient for monthly outreach. No answer.  HIPAA compliant voice message left.    Plan: RN Health Coach will attempt patient again in the month December.    Jone Baseman, RN, MSN [place] [phone]

## 2015-12-13 NOTE — Telephone Encounter (Signed)
Called [employer] and spoke with [name] Pier. She is aware that we are working on these forms. [name] Pier states that they need these forms back no later than 12/20/15.

## 2015-12-17 ENCOUNTER — Other Ambulatory Visit: Payer: Self-pay | Admitting: *Deleted

## 2015-12-17 NOTE — Telephone Encounter (Signed)
Forms have been faxed. Nothing further needed.  

## 2015-12-23 ENCOUNTER — Encounter (HOSPITAL_COMMUNITY): Payer: Self-pay | Admitting: Neurology

## 2015-12-23 ENCOUNTER — Emergency Department (HOSPITAL_COMMUNITY): Payer: Medicare Other

## 2015-12-23 ENCOUNTER — Inpatient Hospital Stay (HOSPITAL_COMMUNITY)
Admission: EM | Admit: 2015-12-23 | Discharge: 2015-12-27 | DRG: 603 | Disposition: A | Discharge status: Home / Self Care | Payer: Medicare Other | Attending: Internal Medicine | Admitting: Internal Medicine

## 2015-12-23 DIAGNOSIS — K219 Gastro-esophageal reflux disease without esophagitis: Secondary | ICD-10-CM | POA: Diagnosis not present

## 2015-12-23 DIAGNOSIS — Q211 Atrial septal defect: Secondary | ICD-10-CM | POA: Diagnosis not present

## 2015-12-23 DIAGNOSIS — I5032 Chronic diastolic (congestive) heart failure: Secondary | ICD-10-CM | POA: Diagnosis not present

## 2015-12-23 DIAGNOSIS — L03116 Cellulitis of left lower limb: Secondary | ICD-10-CM | POA: Diagnosis not present

## 2015-12-23 DIAGNOSIS — I2510 Atherosclerotic heart disease of native coronary artery without angina pectoris: Secondary | ICD-10-CM | POA: Diagnosis not present

## 2015-12-23 DIAGNOSIS — Z7901 Long term (current) use of anticoagulants: Secondary | ICD-10-CM | POA: Diagnosis not present

## 2015-12-23 DIAGNOSIS — Z87891 Personal history of nicotine dependence: Secondary | ICD-10-CM

## 2015-12-23 DIAGNOSIS — Z833 Family history of diabetes mellitus: Secondary | ICD-10-CM

## 2015-12-23 DIAGNOSIS — M109 Gout, unspecified: Secondary | ICD-10-CM | POA: Diagnosis present

## 2015-12-23 DIAGNOSIS — Z951 Presence of aortocoronary bypass graft: Secondary | ICD-10-CM | POA: Diagnosis not present

## 2015-12-23 DIAGNOSIS — Z85828 Personal history of other malignant neoplasm of skin: Secondary | ICD-10-CM | POA: Diagnosis not present

## 2015-12-23 DIAGNOSIS — L0390 Cellulitis, unspecified: Secondary | ICD-10-CM | POA: Diagnosis present

## 2015-12-23 DIAGNOSIS — Z794 Long term (current) use of insulin: Secondary | ICD-10-CM

## 2015-12-23 DIAGNOSIS — E782 Mixed hyperlipidemia: Secondary | ICD-10-CM

## 2015-12-23 DIAGNOSIS — N184 Chronic kidney disease, stage 4 (severe): Secondary | ICD-10-CM

## 2015-12-23 DIAGNOSIS — Z823 Family history of stroke: Secondary | ICD-10-CM

## 2015-12-23 DIAGNOSIS — Z79899 Other long term (current) drug therapy: Secondary | ICD-10-CM

## 2015-12-23 DIAGNOSIS — Z7984 Long term (current) use of oral hypoglycemic drugs: Secondary | ICD-10-CM | POA: Diagnosis not present

## 2015-12-23 DIAGNOSIS — E785 Hyperlipidemia, unspecified: Secondary | ICD-10-CM | POA: Diagnosis present

## 2015-12-23 DIAGNOSIS — N183 Chronic kidney disease, stage 3 unspecified: Secondary | ICD-10-CM | POA: Diagnosis present

## 2015-12-23 DIAGNOSIS — F419 Anxiety disorder, unspecified: Secondary | ICD-10-CM | POA: Diagnosis present

## 2015-12-23 DIAGNOSIS — Z888 Allergy status to other drugs, medicaments and biological substances status: Secondary | ICD-10-CM

## 2015-12-23 DIAGNOSIS — I484 Atypical atrial flutter: Secondary | ICD-10-CM | POA: Diagnosis not present

## 2015-12-23 DIAGNOSIS — E1169 Type 2 diabetes mellitus with other specified complication: Secondary | ICD-10-CM | POA: Diagnosis present

## 2015-12-23 DIAGNOSIS — I480 Paroxysmal atrial fibrillation: Secondary | ICD-10-CM

## 2015-12-23 DIAGNOSIS — I10 Essential (primary) hypertension: Secondary | ICD-10-CM | POA: Diagnosis present

## 2015-12-23 DIAGNOSIS — Z8679 Personal history of other diseases of the circulatory system: Secondary | ICD-10-CM

## 2015-12-23 DIAGNOSIS — K449 Diaphragmatic hernia without obstruction or gangrene: Secondary | ICD-10-CM | POA: Diagnosis present

## 2015-12-23 DIAGNOSIS — Q263 Partial anomalous pulmonary venous connection: Secondary | ICD-10-CM

## 2015-12-23 DIAGNOSIS — G4733 Obstructive sleep apnea (adult) (pediatric): Secondary | ICD-10-CM | POA: Diagnosis not present

## 2015-12-23 DIAGNOSIS — E1122 Type 2 diabetes mellitus with diabetic chronic kidney disease: Secondary | ICD-10-CM | POA: Diagnosis present

## 2015-12-23 DIAGNOSIS — M79672 Pain in left foot: Secondary | ICD-10-CM | POA: Diagnosis not present

## 2015-12-23 DIAGNOSIS — I43 Cardiomyopathy in diseases classified elsewhere: Secondary | ICD-10-CM | POA: Diagnosis present

## 2015-12-23 DIAGNOSIS — F329 Major depressive disorder, single episode, unspecified: Secondary | ICD-10-CM | POA: Diagnosis present

## 2015-12-23 DIAGNOSIS — Z8249 Family history of ischemic heart disease and other diseases of the circulatory system: Secondary | ICD-10-CM

## 2015-12-23 DIAGNOSIS — I130 Hypertensive heart and chronic kidney disease with heart failure and stage 1 through stage 4 chronic kidney disease, or unspecified chronic kidney disease: Secondary | ICD-10-CM | POA: Diagnosis not present

## 2015-12-23 DIAGNOSIS — Z91018 Allergy to other foods: Secondary | ICD-10-CM

## 2015-12-23 DIAGNOSIS — M7989 Other specified soft tissue disorders: Secondary | ICD-10-CM | POA: Diagnosis not present

## 2015-12-23 DIAGNOSIS — Z9889 Other specified postprocedural states: Secondary | ICD-10-CM

## 2015-12-23 DIAGNOSIS — K2100 Gastro-esophageal reflux disease with esophagitis, without bleeding: Secondary | ICD-10-CM | POA: Diagnosis present

## 2015-12-23 DIAGNOSIS — Z9989 Dependence on other enabling machines and devices: Secondary | ICD-10-CM

## 2015-12-23 HISTORY — DX: Chronic kidney disease, stage 3 unspecified: N18.30

## 2015-12-23 HISTORY — DX: Cellulitis, unspecified: L03.90

## 2015-12-23 HISTORY — DX: Chronic kidney disease, stage 3 (moderate): N18.3

## 2015-12-23 LAB — BASIC METABOLIC PANEL
Anion gap: 13 (ref 5–15)
BUN: 38 mg/dL — AB (ref 6–20)
CHLORIDE: 99 mmol/L — AB (ref 101–111)
CO2: 27 mmol/L (ref 22–32)
Calcium: 8.8 mg/dL — ABNORMAL LOW (ref 8.9–10.3)
Creatinine, Ser: 1.97 mg/dL — ABNORMAL HIGH (ref 0.61–1.24)
GFR calc Af Amer: 36 mL/min — ABNORMAL LOW (ref >60)
GFR calc non Af Amer: 31 mL/min — ABNORMAL LOW (ref >60)
GLUCOSE: 290 mg/dL — AB (ref 65–99)
POTASSIUM: 4 mmol/L (ref 3.5–5.1)
Sodium: 139 mmol/L (ref 135–145)

## 2015-12-23 LAB — CBC WITH DIFFERENTIAL/PLATELET
Basophils Absolute: 0 10*3/uL (ref 0.0–0.1)
Basophils Relative: 0 %
Eosinophils Absolute: 0 10*3/uL (ref 0.0–0.7)
Eosinophils Relative: 0 %
HEMATOCRIT: 32.2 % — AB (ref 39.0–52.0)
HEMOGLOBIN: 10.8 g/dL — AB (ref 13.0–17.0)
LYMPHS ABS: 0.6 10*3/uL — AB (ref 0.7–4.0)
LYMPHS PCT: 6 %
MCH: 32 pg (ref 26.0–34.0)
MCHC: 33.5 g/dL (ref 30.0–36.0)
MCV: 95.3 fL (ref 78.0–100.0)
MONOS PCT: 7 %
Monocytes Absolute: 0.8 10*3/uL (ref 0.1–1.0)
NEUTROS PCT: 87 %
Neutro Abs: 10 10*3/uL — ABNORMAL HIGH (ref 1.7–7.7)
Platelets: 149 10*3/uL — ABNORMAL LOW (ref 150–400)
RBC: 3.38 MIL/uL — AB (ref 4.22–5.81)
RDW: 13.6 % (ref 11.5–15.5)
WBC: 11.4 10*3/uL — AB (ref 4.0–10.5)

## 2015-12-23 LAB — PROTIME-INR
INR: 2.1
PROTHROMBIN TIME: 23.9 s — AB (ref 11.4–15.2)

## 2015-12-23 LAB — GLUCOSE, CAPILLARY: Glucose-Capillary: 133 mg/dL — ABNORMAL HIGH (ref 65–99)

## 2015-12-23 MED ORDER — ACETAMINOPHEN 650 MG RE SUPP: 650.0000 mg | Freq: Four times a day (QID) | RECTAL | Status: DC | PRN

## 2015-12-23 MED ORDER — SERTRALINE HCL 100 MG PO TABS
100.0000 mg | ORAL_TABLET | Freq: Every day | ORAL | Status: DC
  Administered 2015-12-24 – 2015-12-27 (×4): 100 mg via ORAL
  Filled 2015-12-23 (×4): qty 1

## 2015-12-23 MED ORDER — ONDANSETRON HCL 4 MG PO TABS: 4.0000 mg | ORAL_TABLET | Freq: Four times a day (QID) | ORAL | Status: DC | PRN

## 2015-12-23 MED ORDER — ALPRAZOLAM 0.5 MG PO TABS: 0.5000 mg | ORAL_TABLET | Freq: Every evening | ORAL | Status: DC | PRN

## 2015-12-23 MED ORDER — HYDROMORPHONE HCL 2 MG/ML IJ SOLN
1.0000 mg | Freq: Once | INTRAMUSCULAR | Status: AC
  Administered 2015-12-23: 1 mg via INTRAVENOUS
  Filled 2015-12-23: qty 1

## 2015-12-23 MED ORDER — TORSEMIDE 20 MG PO TABS
100.0000 mg | ORAL_TABLET | Freq: Every day | ORAL | Status: DC
  Administered 2015-12-24 – 2015-12-27 (×4): 100 mg via ORAL
  Filled 2015-12-23 (×4): qty 5

## 2015-12-23 MED ORDER — LORATADINE 10 MG PO TABS
10.0000 mg | ORAL_TABLET | Freq: Every day | ORAL | Status: DC
  Administered 2015-12-24 – 2015-12-27 (×4): 10 mg via ORAL
  Filled 2015-12-23 (×4): qty 1

## 2015-12-23 MED ORDER — ADULT MULTIVITAMIN W/MINERALS CH
1.0000 | ORAL_TABLET | Freq: Every day | ORAL | Status: DC
  Administered 2015-12-23 – 2015-12-26 (×4): 1 via ORAL
  Filled 2015-12-23 (×4): qty 1

## 2015-12-23 MED ORDER — POLYVINYL ALCOHOL 1.4 % OP SOLN: 1.0000 [drp] | Freq: Every day | OPHTHALMIC | Status: DC | PRN

## 2015-12-23 MED ORDER — SODIUM CHLORIDE 0.9% FLUSH
3.0000 mL | Freq: Two times a day (BID) | INTRAVENOUS | Status: DC
  Administered 2015-12-23 – 2015-12-26 (×6): 3 mL via INTRAVENOUS

## 2015-12-23 MED ORDER — FLUTICASONE PROPIONATE 50 MCG/ACT NA SUSP
1.0000 | Freq: Every day | NASAL | Status: DC | PRN
  Administered 2015-12-24: 1 via NASAL
  Filled 2015-12-23: qty 16

## 2015-12-23 MED ORDER — ATORVASTATIN CALCIUM 40 MG PO TABS
40.0000 mg | ORAL_TABLET | Freq: Every day | ORAL | Status: DC
  Administered 2015-12-23 – 2015-12-26 (×4): 40 mg via ORAL
  Filled 2015-12-23 (×4): qty 1

## 2015-12-23 MED ORDER — INSULIN ASPART 100 UNIT/ML ~~LOC~~ SOLN
0.0000 [IU] | Freq: Three times a day (TID) | SUBCUTANEOUS | Status: DC
  Administered 2015-12-24: 2 [IU] via SUBCUTANEOUS
  Administered 2015-12-24: 1 [IU] via SUBCUTANEOUS
  Administered 2015-12-24: 2 [IU] via SUBCUTANEOUS
  Administered 2015-12-25: 1 [IU] via SUBCUTANEOUS
  Administered 2015-12-25: 2 [IU] via SUBCUTANEOUS
  Administered 2015-12-25: 5 [IU] via SUBCUTANEOUS
  Administered 2015-12-26: 2 [IU] via SUBCUTANEOUS
  Administered 2015-12-26: 3 [IU] via SUBCUTANEOUS
  Administered 2015-12-26: 2 [IU] via SUBCUTANEOUS
  Administered 2015-12-27: 7 [IU] via SUBCUTANEOUS
  Administered 2015-12-27: 9 [IU] via SUBCUTANEOUS

## 2015-12-23 MED ORDER — ACETAMINOPHEN 325 MG PO TABS
650.0000 mg | ORAL_TABLET | Freq: Four times a day (QID) | ORAL | Status: DC | PRN
  Administered 2015-12-24: 650 mg via ORAL
  Filled 2015-12-23: qty 2

## 2015-12-23 MED ORDER — VITAMIN D 1000 UNITS PO TABS
5000.0000 [IU] | ORAL_TABLET | Freq: Every day | ORAL | Status: DC
  Administered 2015-12-23 – 2015-12-26 (×4): 5000 [IU] via ORAL
  Filled 2015-12-23 (×5): qty 5

## 2015-12-23 MED ORDER — VANCOMYCIN HCL IN DEXTROSE 1-5 GM/200ML-% IV SOLN
1000.0000 mg | INTRAVENOUS | Status: DC
  Administered 2015-12-23 – 2015-12-26 (×4): 1000 mg via INTRAVENOUS
  Filled 2015-12-23 (×5): qty 200

## 2015-12-23 MED ORDER — TORSEMIDE 20 MG PO TABS: 100.0000 mg | ORAL_TABLET | ORAL | Status: DC

## 2015-12-23 MED ORDER — CARVEDILOL 6.25 MG PO TABS
6.2500 mg | ORAL_TABLET | Freq: Two times a day (BID) | ORAL | Status: DC
  Administered 2015-12-24 – 2015-12-27 (×7): 6.25 mg via ORAL
  Filled 2015-12-23 (×7): qty 1

## 2015-12-23 MED ORDER — WARFARIN SODIUM 5 MG PO TABS
10.0000 mg | ORAL_TABLET | ORAL | Status: AC
  Administered 2015-12-23: 10 mg via ORAL
  Filled 2015-12-23: qty 2

## 2015-12-23 MED ORDER — IRBESARTAN 300 MG PO TABS
150.0000 mg | ORAL_TABLET | Freq: Every day | ORAL | Status: DC
  Administered 2015-12-24 – 2015-12-27 (×4): 150 mg via ORAL
  Filled 2015-12-23 (×4): qty 1

## 2015-12-23 MED ORDER — OCUVITE-LUTEIN PO CAPS
1.0000 | ORAL_CAPSULE | Freq: Two times a day (BID) | ORAL | Status: DC
  Administered 2015-12-23: 1 via ORAL
  Filled 2015-12-23 (×2): qty 1

## 2015-12-23 MED ORDER — ONDANSETRON HCL 4 MG/2ML IJ SOLN: 4.0000 mg | Freq: Four times a day (QID) | INTRAMUSCULAR | Status: DC | PRN

## 2015-12-23 MED ORDER — MORPHINE SULFATE (PF) 2 MG/ML IV SOLN
1.0000 mg | INTRAVENOUS | Status: DC | PRN
  Administered 2015-12-24: 1 mg via INTRAVENOUS
  Filled 2015-12-23: qty 1

## 2015-12-23 MED ORDER — POTASSIUM CHLORIDE CRYS ER 20 MEQ PO TBCR
20.0000 meq | EXTENDED_RELEASE_TABLET | Freq: Two times a day (BID) | ORAL | Status: DC
  Administered 2015-12-23 – 2015-12-27 (×8): 20 meq via ORAL
  Filled 2015-12-23 (×8): qty 1

## 2015-12-23 MED ORDER — ACETAMINOPHEN 500 MG PO TABS
500.0000 mg | ORAL_TABLET | Freq: Four times a day (QID) | ORAL | Status: DC | PRN
  Administered 2015-12-26: 1000 mg via ORAL
  Filled 2015-12-23: qty 2

## 2015-12-23 MED ORDER — WARFARIN - PHARMACIST DOSING INPATIENT
Freq: Every day | Status: DC
  Administered 2015-12-26: 18:00:00

## 2015-12-23 MED ORDER — HYDROCODONE-ACETAMINOPHEN 5-325 MG PO TABS
1.0000 | ORAL_TABLET | Freq: Four times a day (QID) | ORAL | Status: DC | PRN
  Administered 2015-12-23 – 2015-12-24 (×2): 1 via ORAL
  Administered 2015-12-24 – 2015-12-25 (×3): 2 via ORAL
  Filled 2015-12-23 (×3): qty 2
  Filled 2015-12-23: qty 1
  Filled 2015-12-23: qty 2

## 2015-12-23 MED ORDER — ALBUTEROL SULFATE (2.5 MG/3ML) 0.083% IN NEBU: 2.5000 mg | INHALATION_SOLUTION | RESPIRATORY_TRACT | Status: DC | PRN

## 2015-12-23 NOTE — Progress Notes (Signed)
Pt BP 98/45, HR 75, Temp 100.4. Baltazar Najjar, NP notified at 2357. Pt asymptomatic.

## 2015-12-23 NOTE — H&P (Signed)
HISTORY AND PHYSICAL       PATIENT DETAILS Name: [name] Age: 75 y.o. Sex: male Date of Birth: 09/20/1940 Admit Date: 12/23/2015 FKC:[id number],[email or web address] DAVID, MD   Patient coming from: Home   CHIEF COMPLAINT:  Left foot erythema, swelling and pain for the past 3 days  HPI: [name] is a 75 y.o. 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 Per patient, over the weekend he noticed some pain in his left medial-mid foot area. This gradually progressed, he also noticed some erythema that progressed over the past few days. When he woke up this morning, pain was very severe and he was unable to bear weight on the left lower extremity. As a result he was brought to the emergency room, and I was asked to admit this patient for further evaluation and treatment.  Patient denies any history of fever. Patient denies any history of cuts/scratches to the left foot.  No history of nausea, vomiting or diarrhea.  ED Course:  X-ray of the left foot negative for fracture or any other acute abnormalities. Patient given intravenous vancomycin.  Note: Lives at: Home Mobility: Independent Chronic Indwelling Foley:no   REVIEW OF SYSTEMS:  Constitutional:   No  weight loss, night sweats,  Fevers, chills, fatigue.  HEENT:    No headaches, Dysphagia,Tooth/dental problems,Sore throat,  No sneezing, itching, ear ache, nasal congestion, post nasal drip  Cardio-vascular: No chest pain,Orthopnea, PND,lower extremity edema, anasarca, palpitations  GI:  No heartburn, indigestion, abdominal pain, nausea, vomiting, diarrhea, melena or hematochezia  Resp: No shortness of breath, cough, hemoptysis,plueritic chest pain.   Skin:  No rash or lesions.  GU:  No dysuria, change in color of urine, no urgency  or frequency.  No flank pain.  Musculoskeletal: No decreased range of motion.  No back pain.  Endocrine: No heat intolerance, no cold intolerance, no polyuria, no polydipsia  Psych: No change in mood or affect. No depression or anxiety.  No memory loss.   ALLERGIES:   Allergies  Allergen Reactions  . Sunflower Seed [Sunflower Oil] Swelling and Other (See Comments)    Tongue and lip swelling  . Horse-Derived Products Other (See Comments)    Per allergy skin test  . Tetanus Toxoids Other (See Comments)    Per allergy skin test    PAST MEDICAL HISTORY: Past Medical History:  Diagnosis Date  . Adrenal adenoma   . Anxiety   . Atypical atrial flutter ([place]) 8/15, 10/15   a. DCCV 08/2013. b. s/p RFA 10/2013.  [name] Basal cell carcinoma   . CAD (coronary artery disease)    a. 04/2013 CABG x 2: LIMA to LAD, SVG to RI, EVH via R thigh.  . Chronic diastolic congestive heart failure ([place])   . Depression   . Diabetes mellitus type II   . Diverticulosis 2001  . DJD (degenerative joint disease)   . GERD (gastroesophageal reflux disease)   . H/O hiatal hernia   . History of cardioversion    x3 (years uncertain)  . Hx of adenomatous colonic polyps   . Hyperlipidemia   . Hypertension   . Hypertensive cardiomyopathy ([place])   . Obstructive sleep apnea    compliant with CPAP  . 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 Persistent atrial fibrillation ([place])    a. s/p MAZE 04/2013 in setting of CABG. b. Amio stopped in 10/2013 after flutter ablation.  [name] PFO (patent foramen ovale)    a. Small PFO by TEE 10/2013.  [name] Pleural effusion, left    a. s/p thoracentesis 05/2013.  [name] Respiratory failure ([place])    a. Hypoxia 10/2013 - required supp O2 as inpatient, did not require it at discharge.  . S/P Maze operation for atrial fibrillation    a. 04/2013: Complete bilateral atrial lesion set using  cryothermy and bipolar radiofrequency ablation with clipping of LA appendage (@ time of CABG)  . S/P Maze operation for atrial fibrillation 04/05/2013   Complete bilateral atrial lesion set using cryothermy and bipolar radiofrequency ablation with clipping of LA appendage via median sternotomy approach     PAST SURGICAL HISTORY: Past Surgical History:  Procedure Laterality Date  . ATRIAL FIBRILLATION ABLATION N/A 10/26/2013   Procedure: ATRIAL FIBRILLATION ABLATION;  Surgeon: Coralyn Mark, MD;  Location: [place] CATH LAB;  Service: Cardiovascular;  Laterality: N/A;  . BASAL CELL CARCINOMA EXCISION     x3 on face  . CARDIAC CATHETERIZATION     myocardial bridge but no cad  . CARDIOVERSION N/A 08/23/2013   Procedure: CARDIOVERSION;  Surgeon: Sanda Klein, MD;  Location: [place];  Service: Cardiovascular;  Laterality: N/A;  . CARPOMETACARPEL SUSPENSION PLASTY Left 02/14/2014   Procedure: CARPOMETACARPEL ([place]) SUSPENSIONPLASTY THUMB  WITH  ABDUCTOR POLLICIS LONGUS TRANSFER AND STENOSING TENOSYNOVITIS RELEASE LEFT WRIST;  Surgeon: Charlotte Crumb, MD;  Location: [place];  Service: Orthopedics;  Laterality: Left;  . CATARACT EXTRACTION     bilateral  . CORONARY ARTERY BYPASS GRAFT N/A 04/05/2013   Procedure: CORONARY ARTERY BYPASS GRAFTING (CABG) TIMES TWO USING LEFT INTERNAL MAMMARY ARTERY AND RIGHT SAPHENOUS LEG VEIN HARVESTED ENDOSCOPICALLY;  Surgeon: Rexene Alberts, MD;  Location: [place];  Service: Open Heart Surgery;  Laterality: N/A;  . GREAT TOE ARTHRODESIS, INTERPHALANGEAL JOINT     Right foot  . INTRAOPERATIVE TRANSESOPHAGEAL ECHOCARDIOGRAM N/A 04/05/2013   Procedure: INTRAOPERATIVE TRANSESOPHAGEAL ECHOCARDIOGRAM;  Surgeon: Rexene Alberts, MD;  Location: [place];  Service: Open Heart Surgery;  Laterality: N/A;  . LEFT HEART CATHETERIZATION WITH CORONARY ANGIOGRAM N/A 03/07/2013   Procedure: LEFT HEART CATHETERIZATION WITH CORONARY ANGIOGRAM;  Surgeon: Burnell Blanks,  MD;  Location: [facility] CATH LAB;  Service: Cardiovascular;  Laterality: N/A;  . MAZE N/A 04/05/2013   Procedure: MAZE;  Surgeon: Rexene Alberts, MD;  Location: [place];  Service: Open Heart Surgery;  Laterality: N/A;  . Polinydal cyst     Removed  . POLYPECTOMY    . Retina repair-right    . RIGHT HEART CATHETERIZATION N/A 05/03/2014   Procedure: RIGHT HEART CATH;  Surgeon: Jolaine Artist, MD;  Location: [facility] CATH LAB;  Service: Cardiovascular;  Laterality: N/A;  . TEE WITHOUT CARDIOVERSION N/A 08/23/2013   Procedure: TRANSESOPHAGEAL ECHOCARDIOGRAM (TEE);  Surgeon: Sanda Klein, MD;  [facility];  Service: Cardiovascular;  Laterality: N/A;  . TEE WITHOUT CARDIOVERSION N/A 10/26/2013   Procedure: TRANSESOPHAGEAL ECHOCARDIOGRAM (TEE);  Surgeon: Sueanne Margarita, MD;  Location: [facility] ENDOSCOPY;  Service: Cardiovascular;  Laterality: N/A;  . TEE WITHOUT CARDIOVERSION N/A 06/05/2014   Procedure: TRANSESOPHAGEAL ECHOCARDIOGRAM (TEE);  Surgeon: Thayer Headings, MD;  Location: [place];  Service: Cardiovascular;  Laterality: N/A;  . TRAPEZIUM RESECTION  MEDICATIONS AT HOME: Prior to Admission medications   Medication Sig Start Date End Date Taking? Authorizing Provider  acetaminophen (TYLENOL) 500 MG tablet Take 500-1,000 mg by mouth every 6 (six) hours as needed (pain).   Yes Historical Provider, MD  ALPRAZolam Duanne Moron) 1 MG tablet Take 0.5 tablets (0.5 mg total) by mouth at bedtime as needed for sleep. Patient taking differently: Take 0.5-1 mg by mouth at bedtime as needed for sleep.  10/03/15  Yes Vicie Mutters, PA-C  Alum Hydroxide-Mag Carbonate (GAVISCON PO) Take 1 tablet by mouth daily as needed (acid reflux).   Yes Historical Provider, MD  atorvastatin (LIPITOR) 40 MG tablet TAKE 1 TABLET BY MOUTH EVERY DAY Patient taking differently: TAKE 1/2 TABLET BY MOUTH ON MONDAY, WEDNESDAY, [place] AT BEDTIME 11/25/15  Yes [name], MD  azelastine (ASTELIN) 0.1 % nasal spray Place 2 sprays  into both nostrils 2 (two) times daily. Use in each nostril as directed Patient taking differently: Place 2 sprays into both nostrils 2 (two) times daily as needed for allergies. Use in each nostril as directed 11/12/15 11/11/16 Yes Courtney Forcucci, PA-C  B Complex Vitamins (VITAMIN B COMPLEX PO) Take 1 tablet by mouth at bedtime.    Yes Historical Provider, MD  carvedilol (COREG) 6.25 MG tablet TAKE 1 TABLET (6.25 MG TOTAL) BY MOUTH 2 (TWO) TIMES DAILY. 09/23/15  Yes Jolaine Artist, MD  Cholecalciferol (VITAMIN D-3) 5000 units TABS Take 5,000 Units by mouth at bedtime.   Yes Historical Provider, MD  diphenhydrAMINE (BENADRYL) 25 MG tablet Take 25 mg by mouth at bedtime as needed for allergies.    Yes Historical Provider, MD  fluticasone (FLONASE) 50 MCG/ACT nasal spray Place 1 spray into both nostrils daily as needed for allergies.    Yes Historical Provider, MD  glimepiride (AMARYL) 4 MG tablet Take 1 tablet 3 times daily. Patient taking differently: Take 4 mg by mouth 3 (three) times daily with meals. Take 1 tablet 3 times daily 12/04/15  Yes [name], MD  HYDROcodone-acetaminophen (NORCO/VICODIN) 5-325 MG tablet Take 1-2 tablets by mouth every 6 (six) hours as needed for moderate pain.    Yes Historical Provider, MD  lidocaine (LIDODERM) 5 % Place 1 patch onto the skin daily as needed (pain). Remove & Discard patch within 12 hours or as directed by MD   Yes Historical Provider, MD  loratadine (CLARITIN) 10 MG tablet Take 10 mg by mouth daily.   Yes Historical Provider, MD  metFORMIN (GLUCOPHAGE-XR) 500 MG 24 hr tablet TAKE 2 TABLETS BY MOUTH TWO TIMES DAILY 12/02/15  Yes [name], MD  metolazone (ZAROXOLYN) 5 MG tablet Take 5 mg by mouth daily as needed (weight gain and edema). Patient taking differently: Take 5 mg by mouth daily as needed (weight gain of >2 lbs/edema). Take 5 mg by mouth daily as needed (weight gain and edema) 09/10/15  Yes Larey Dresser, MD  Multiple Vitamin  (MULTIVITAMIN WITH MINERALS) TABS tablet Take 1 tablet by mouth at bedtime.    Yes Historical Provider, MD  Multiple Vitamins-Minerals (PRESERVISION AREDS 2) CAPS Take 1 capsule by mouth 2 (two) times daily.    Yes Historical Provider, MD  polyvinyl alcohol (ARTIFICIAL TEARS) 1.4 % ophthalmic solution Place 1 drop into both eyes daily as needed for dry eyes.   Yes Historical Provider, MD  potassium chloride SA (K-DUR,KLOR-CON) 20 MEQ tablet Take 1 tablet (20 mEq total) by mouth 2 (two) times daily. Patient taking differently: Take 20 mEq by mouth See  admin instructions. Take 1 tablet (20 meq) by mouth twice daily, also take an extra tablet the morning after a metolazone tablet 08/05/15  Yes Vicie Mutters, PA-C  PRESCRIPTION MEDICATION Inhale into the lungs at bedtime. CPAP   Yes Historical Provider, MD  ranitidine (ZANTAC) 150 MG tablet Take 150 mg by mouth daily as needed for heartburn.    Yes Historical Provider, MD  sertraline (ZOLOFT) 100 MG tablet TAKE 1 TABLET BY MOUTH DAILY 12/18/14  Yes [name], MD  torsemide (DEMADEX) 100 MG tablet Take 100 mg in AM and 50 mg in PM if needed for weight gain Patient taking differently: Take 50-100 mg by mouth See admin instructions. Take 1 tablet (100 mg) by mouth every morning, may take an additional 1/2 tablet (50 mg) if needed for weight gain of 2 lbs/24 hours 10/31/15  Yes Shaune Pascal Bensimhon, MD  TRULICITY 3.33 LK/5.6YB SOPN INJECT 1 PEN INTO THE SKIN ONCE A WEEK. Patient taking differently: Inject 0.75 mg into the skin every Tuesday.  08/21/15  Yes [name], MD  valsartan (DIOVAN) 320 MG tablet Take 160 mg by mouth daily.   Yes Historical Provider, MD  warfarin (COUMADIN) 5 MG tablet TAKE 1 TABLET BY MOUTH TWICE A DAY 12/02/15  Yes [name], MD  BAYER MICROLET LANCETS lancets Test once a day 05/12/15   Vicie Mutters, PA-C  Blood Glucose Monitoring Suppl (ACCU-CHEK AVIVA PLUS) w/Device KIT Check blood sugar 1 time  daily 07/12/15    [name], MD  glucose blood (ACCU-CHEK AVIVA PLUS) test strip Check blood glucose 3 xdaily.   [id number] - fluctuating glucoses - uncontrolled Diabetes 02/08/15 02/08/16  [name], MD    FAMILY HISTORY: Family History  Problem Relation Age of Onset  . Dementia Father   . Colon cancer Mother     Family History/Uncle   . Colon polyps Mother     Family History  . Atrial fibrillation Mother   . Hypertension Mother   . Colon polyps Sister     Family history  . Diabetes Maternal Uncle   . Stroke Paternal Uncle      SOCIAL HISTORY:  reports that he quit smoking about 34 years ago. His smoking use included Cigarettes. He has a 100.00 pack-year smoking history. He has never used smokeless tobacco. He reports that he drinks about 0.6 oz of alcohol per week . He reports that he does not use drugs.  PHYSICAL EXAM: Blood pressure 136/73, pulse 81, temperature 100.4 F (38 C), temperature source Rectal, resp. rate 17, SpO2 98 %.  General appearance :Awake, alert, not in any distress. Speech Clear. Not toxic Looking Eyes:, pupils equally reactive to light and accomodation,no scleral icterus.Pink conjunctiva HEENT: Atraumatic and Normocephalic Neck: supple, no JVD. No cervical lymphadenopathy. No thyromegaly Resp:Good air entry bilaterally, no added sounds  CVS: S1 S2 regular GI: Bowel sounds present, Non tender and not distended with no gaurding, rigidity or rebound.No organomegaly Extremities: Left foot-see picture below. Right foot/right lower extremity without any edema. Neurology:  speech clear,Non focal, sensation is grossly intact. Psychiatric: Normal judgment and insight. Alert and oriented x 3. Normal mood. Musculoskeletal:No digital cyanosis Skin:No Rash, warm and dry Wounds:N/A    LABS ON ADMISSION:  I have personally reviewed following labs and imaging studies  CBC:  Recent Labs Lab 12/23/15 1201  WBC 11.4*  NEUTROABS 10.0*  HGB 10.8*  HCT 32.2*  MCV 95.3    PLT 149*    Basic Metabolic Panel:  Recent Labs  Lab 12/23/15 1201  NA 139  K 4.0  CL 99*  CO2 27  GLUCOSE 290*  BUN 38*  CREATININE 1.97*  CALCIUM 8.8*    GFR: CrCl cannot be calculated (Unknown ideal weight.).  Liver Function Tests: No results for input(s): AST, ALT, ALKPHOS, BILITOT, PROT, ALBUMIN in the last 168 hours. No results for input(s): LIPASE, AMYLASE in the last 168 hours. No results for input(s): AMMONIA in the last 168 hours.  Coagulation Profile: No results for input(s): INR, PROTIME in the last 168 hours.  Cardiac Enzymes: No results for input(s): CKTOTAL, CKMB, CKMBINDEX, TROPONINI in the last 168 hours.  BNP (last 3 results) No results for input(s): PROBNP in the last 8760 hours.  HbA1C: No results for input(s): HGBA1C in the last 72 hours.  CBG: No results for input(s): GLUCAP in the last 168 hours.  Lipid Profile: No results for input(s): CHOL, HDL, LDLCALC, TRIG, CHOLHDL, LDLDIRECT in the last 72 hours.  Thyroid Function Tests: No results for input(s): TSH, T4TOTAL, FREET4, T3FREE, THYROIDAB in the last 72 hours.  Anemia Panel: No results for input(s): VITAMINB12, FOLATE, FERRITIN, TIBC, IRON, RETICCTPCT in the last 72 hours.  Urine analysis:    Component Value Date/Time   COLORURINE YELLOW 03/05/2014 1627   APPEARANCEUR CLEAR 03/05/2014 1627   LABSPEC 1.020 03/05/2014 1627   PHURINE 5.0 03/05/2014 1627   GLUCOSEU NEG 03/05/2014 1627   GLUCOSEU 500 06/04/2010 1626   HGBUR NEG 03/05/2014 1627   BILIRUBINUR NEG 03/05/2014 1627   KETONESUR NEG 03/05/2014 1627   PROTEINUR NEG 03/05/2014 1627   UROBILINOGEN 0.2 03/05/2014 1627   NITRITE NEG 03/05/2014 1627   LEUKOCYTESUR NEG 03/05/2014 1627    Sepsis Labs: Lactic Acid, Venous No results found for: [place]   Microbiology: No results found for this or any previous visit (from the past 240 hour(s)).    RADIOLOGIC STUDIES ON ADMISSION: Dg Foot Complete Left  Result  Date: 12/23/2015 CLINICAL DATA:  75 year old male with left foot pain and swelling for 4 days. No known injury. Initial encounter. EXAM: LEFT FOOT - COMPLETE 3+ VIEW COMPARISON:  None. FINDINGS: Calcified peripheral vascular disease.   No subcutaneous gas. Calcaneus appears intact with minimal degenerative spurring. Small chronic ossific fragment adjacent to degenerative spurring at the neck of the talus. Other tarsal bones appear intact. First MTP osteoarthritis with mild to moderate joint space loss and subchondral sclerosis. Healed chronic fracture of the fifth proximal phalanx suspected. No acute osseous abnormality identified. IMPRESSION: 1.  No acute osseous abnormality identified about the left foot. 2. Calcified peripheral vascular disease. Electronically Signed   By: Genevie Ann M.D.   On: 12/23/2015 12:17   I have personally reviewed images of chest xray Of the left foot   ASSESSMENT AND PLAN: Present on Admission: . Cellulitis of the left foot: Continue intravenous Vancomycin. Elevate left lower extremity. Follow cultures and clinical course. Awaiting PT/INR-if subtherapeutic, we'll obtain a lower extremity Doppler-but low suspicion for DVT at this point.  . T2_NIDDM w/CKD3: Hold home medications-start SSI and follow CBGs.   . Hyperlipidemia: Continue statin  . HTN (hypertension): Controlled, continue Coreg, and valsartan. Follow electrolytes, if any further worsening in renal function may need to hold losartan.  [name] GERD: Continue PPI  . CKD (chronic kidney disease) stage 3, GFR 30-59 ml/min: creatinine close to usual baseline, follow.  . Chronic diastolic congestive heart failure: Compensated, continue with Demadex.  . Atypical atrial flutter s/p RFA 10/26/13: Rate controlled with Coreg, pharmacy to  dose Coumadin while inpatient. Awaiting INR.  Further plan will depend as patient's clinical course evolves and further radiologic and laboratory data become available. Patient will be  monitored closely.  Above noted plan was discussed with spouse face to face at bedside, they were in agreement.   CONSULTS: None  DVT Prophylaxis: Coumadin  Code Status: Full Code-but does not want to kept on life support for a prolonged period of time  Disposition Plan:  Discharge back home  in 2 days, depending on clinical course  Admission status: Inpatient going to tele  Total time spent  55 minutes.Greater than 50% of this time was spent in counseling, explanation of diagnosis, planning of further management, and coordination of care.  [facility] Pager [phone]  If 7PM-7AM, please contact night-coverage [email or web address] Password [email or web address] 12/23/2015, 5:45 PM

## 2015-12-23 NOTE — Progress Notes (Signed)
Pharmacy Antibiotic Note  [name] is a 75 y.o. male admitted on 12/23/2015 with cellulitis.  Pharmacy has been consulted for vancomycin dosing.  Plan: Vancomycin 1g IV every 24 hours.  Goal trough 10-15 mcg/mL.  Monitor culture data, renal function and clinical course VT at SS prn     Temp (24hrs), Avg:99.1 F (37.3 C), Min:99 F (37.2 C), Max:99.3 F (37.4 C)   Recent Labs Lab 12/23/15 1201  WBC 11.4*  CREATININE 1.97*    CrCl cannot be calculated (Unknown ideal weight.).    Allergies  Allergen Reactions  . Sunflower Seed [Sunflower Oil] Swelling and Other (See Comments)    Tongue and lip swelling  . Horse-Derived Products Other (See Comments)    Per allergy skin test  . Tetanus Toxoids Other (See Comments)    Per allergy skin test    Antimicrobials this admission: Vanc 12/18 >>   Dose adjustments this admission: n/a  Microbiology results:  BCx:   UCx:    Sputum:    MRSA PCR:    Andrey Cota. Diona Foley, PharmD, BCPS Clinical Pharmacist Pager [phone] 12/23/2015 4:45 PM

## 2015-12-23 NOTE — ED Provider Notes (Signed)
[name] DEPT Provider Note   CSN: [id number] Arrival date & time: 12/23/15  1056   History   Chief Complaint Chief Complaint  Patient presents with  . Foot Pain    HPI [name] is a 75 y.o. male.  Patient with PMH of PAF on chronic coumadin therapy, T2DM, CKD, CHF, CAD s/p CABG presenting with progressive 4 day history of left foot pain, swelling and redness. Patient states that the pain began in his lateral foot but now is widespread across the dorsum of his foot and ankle. Patient states that he was unable to bear weight on foot due to pain. He denies trauma, injury or noticing of wounds/cuts to his foot. He endorses subjective fevers.      Past Medical History:  Diagnosis Date  . Adrenal adenoma   . Anxiety   . Atypical atrial flutter ([place]) 8/15, 10/15   a. DCCV 08/2013. b. s/p RFA 10/2013.  [name] Basal cell carcinoma   . CAD (coronary artery disease)    a. 04/2013 CABG x 2: LIMA to LAD, SVG to RI, EVH via R thigh.  . Chronic diastolic congestive heart failure ([place])   . Depression   . Diabetes mellitus type II   . Diverticulosis 2001  . DJD (degenerative joint disease)   . GERD (gastroesophageal reflux disease)   . H/O hiatal hernia   . History of cardioversion    x3 (years uncertain)  . Hx of adenomatous colonic polyps   . Hyperlipidemia   . Hypertension   . Hypertensive cardiomyopathy ([place])   . Obstructive sleep apnea    compliant with CPAP  . 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 Persistent atrial fibrillation ([place])    a. s/p MAZE 04/2013 in setting of CABG. b. Amio stopped in 10/2013 after flutter ablation.  [name] PFO (patent foramen ovale)    a. Small PFO by TEE 10/2013.  [name] Pleural effusion, left    a. s/p thoracentesis 05/2013.  [name] Respiratory failure ([place])    a. Hypoxia 10/2013 - required supp O2 as inpatient, did not require it at discharge.    . S/P Maze operation for atrial fibrillation    a. 04/2013: Complete bilateral atrial lesion set using cryothermy and bipolar radiofrequency ablation with clipping of LA appendage (@ time of CABG)  . S/P Maze operation for atrial fibrillation 04/05/2013   Complete bilateral atrial lesion set using cryothermy and bipolar radiofrequency ablation with clipping of LA appendage via median sternotomy approach     Patient Active Problem List   Diagnosis Date Noted  . CKD (chronic kidney disease) stage 3, GFR 30-59 ml/min 01/11/2015  . 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 Chronic restrictive lung disease 09/03/2014  . Pulmonary hypertension 08/23/2014  . Medicare annual wellness visit, subsequent 07/19/2014  . 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[place]) 01/30/2014  . OSA on CPAP 01/30/2014  . PFO (patent foramen ovale) 10/30/2013  . CAD (coronary artery disease) 10/29/2013  . Chronic respiratory failure ([place]) 10/27/2013  . HTN (hypertension) 10/25/2013  . Chronic anticoagulation 08/22/2013  . Acute on chronic diastolic CHF (congestive heart failure), NYHA class 3 ([place]) 08/21/2013  . Atypical atrial flutter s/p RFA 10/26/13 08/21/2013  . Atrial flutter ([place]) 08/21/2013  . Vitamin D deficiency 05/19/2013  . Medication management 05/19/2013  .  Pleural effusion on left 05/15/2013  . 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 GERD   . DJD (degenerative joint disease)   . 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 S/P Maze operation for atrial fibrillation 04/05/2013  . Chronic diastolic congestive heart failure ([place])   . Hypertensive cardiomyopathy ([place])   . Hyperlipidemia 09/03/2008  . PAF (paroxysmal atrial fibrillation) ([place]) 09/03/2008  . Diverticulosis of large intestine 09/03/2008  . Colonic Polyps 09/03/2008    Past Surgical History:  Procedure Laterality  Date  . ATRIAL FIBRILLATION ABLATION N/A 10/26/2013   Procedure: ATRIAL FIBRILLATION ABLATION;  Surgeon: Coralyn Mark, MD;  Location: [place] CATH LAB;  Service: Cardiovascular;  Laterality: N/A;  . BASAL CELL CARCINOMA EXCISION     x3 on face  . CARDIAC CATHETERIZATION     myocardial bridge but no cad  . CARDIOVERSION N/A 08/23/2013   Procedure: CARDIOVERSION;  Surgeon: Sanda Klein, MD;  Location: [place];  Service: Cardiovascular;  Laterality: N/A;  . CARPOMETACARPEL SUSPENSION PLASTY Left 02/14/2014   Procedure: CARPOMETACARPEL ([place]) SUSPENSIONPLASTY THUMB  WITH  ABDUCTOR POLLICIS LONGUS TRANSFER AND STENOSING TENOSYNOVITIS RELEASE LEFT WRIST;  Surgeon: Charlotte Crumb, MD;  Location: [place];  Service: Orthopedics;  Laterality: Left;  . CATARACT EXTRACTION     bilateral  . CORONARY ARTERY BYPASS GRAFT N/A 04/05/2013   Procedure: CORONARY ARTERY BYPASS GRAFTING (CABG) TIMES TWO USING LEFT INTERNAL MAMMARY ARTERY AND RIGHT SAPHENOUS LEG VEIN HARVESTED ENDOSCOPICALLY;  Surgeon: Rexene Alberts, MD;  Location: [place];  Service: Open Heart Surgery;  Laterality: N/A;  . GREAT TOE ARTHRODESIS, INTERPHALANGEAL JOINT     Right foot  . INTRAOPERATIVE TRANSESOPHAGEAL ECHOCARDIOGRAM N/A 04/05/2013   Procedure: INTRAOPERATIVE TRANSESOPHAGEAL ECHOCARDIOGRAM;  Surgeon: Rexene Alberts, MD;  Location: [place];  Service: Open Heart Surgery;  Laterality: N/A;  . LEFT HEART CATHETERIZATION WITH CORONARY ANGIOGRAM N/A 03/07/2013   Procedure: LEFT HEART CATHETERIZATION WITH CORONARY ANGIOGRAM;  Surgeon: Burnell Blanks, MD;  Location[name] CATH LAB;  Service: Cardiovascular;  Laterality: N/A;  . MAZE N/A 04/05/2013   Procedure: MAZE;  Surgeon: Rexene Alberts, MD;  Location: [place];  Service: Open Heart Surgery;  Laterality: N/A;  . Polinydal cyst     Removed  . POLYPECTOMY    . Retina repair-right    . RIGHT HEART CATHETERIZATION N/A 05/03/2014   Procedure: RIGHT HEART CATH;  Surgeon:  Jolaine Artist, MD;  Location[facility] LAB;  Service: Cardiovascular;  Laterality: N/A;  . TEE WITHOUT CARDIOVERSION N/A 08/23/2013   Procedure: TRANSESOPHAGEAL ECHOCARDIOGRAM (TEE);  Surgeon: Sanda Klein, MD;  Location: [facility] ENDOSCOPY;  Service: Cardiovascular;  Laterality: N/A;  . TEE WITHOUT CARDIOVERSION N/A 10/26/2013   Procedure: TRANSESOPHAGEAL ECHOCARDIOGRAM (TEE);  Surgeon: Sueanne Margarita, MD;  Location: [facility] ENDOSCOPY;  Service: Cardiovascular;  Laterality: N/A;  . TEE WITHOUT CARDIOVERSION N/A 06/05/2014   Procedure: TRANSESOPHAGEAL ECHOCARDIOGRAM (TEE);  Surgeon: Thayer Headings, MD;  Location: [facility] ENDOSCOPY;  Service: Cardiovascular;  Laterality: N/A;  . TRAPEZIUM RESECTION         Home Medications    Prior to Admission medications   Medication Sig Start Date End Date Taking? Authorizing Provider  acetaminophen (TYLENOL) 500 MG tablet Take 500-1,000 mg by mouth every 6 (six) hours as needed (pain).   Yes Historical Provider, MD  ALPRAZolam Duanne Moron) 1 MG tablet Take 0.5 tablets (0.5 mg total) by mouth at bedtime as needed for sleep. Patient taking differently: Take  0.5-1 mg by mouth at bedtime as needed for sleep.  10/03/15  Yes Vicie Mutters, PA-C  Alum Hydroxide-Mag Carbonate (GAVISCON PO) Take 1 tablet by mouth daily as needed (acid reflux).   Yes Historical Provider, MD  atorvastatin (LIPITOR) 40 MG tablet TAKE 1 TABLET BY MOUTH EVERY DAY Patient taking differently: TAKE 1/2 TABLET BY MOUTH ON MONDAY, WEDNESDAY, [place] AT BEDTIME 11/25/15  Yes [name], MD  azelastine (ASTELIN) 0.1 % nasal spray Place 2 sprays into both nostrils 2 (two) times daily. Use in each nostril as directed Patient taking differently: Place 2 sprays into both nostrils 2 (two) times daily as needed for allergies. Use in each nostril as directed 11/12/15 11/11/16 Yes Courtney Forcucci, PA-C  B Complex Vitamins (VITAMIN B COMPLEX PO) Take 1 tablet by mouth at bedtime.    Yes Historical Provider, MD    carvedilol (COREG) 6.25 MG tablet TAKE 1 TABLET (6.25 MG TOTAL) BY MOUTH 2 (TWO) TIMES DAILY. 09/23/15  Yes Jolaine Artist, MD  Cholecalciferol (VITAMIN D-3) 5000 units TABS Take 5,000 Units by mouth at bedtime.   Yes Historical Provider, MD  diphenhydrAMINE (BENADRYL) 25 MG tablet Take 25 mg by mouth at bedtime as needed for allergies.    Yes Historical Provider, MD  fluticasone (FLONASE) 50 MCG/ACT nasal spray Place 1 spray into both nostrils daily as needed for allergies.    Yes Historical Provider, MD  glimepiride (AMARYL) 4 MG tablet Take 1 tablet 3 times daily. Patient taking differently: Take 4 mg by mouth 3 (three) times daily with meals. Take 1 tablet 3 times daily 12/04/15  Yes [name], MD  HYDROcodone-acetaminophen (NORCO/VICODIN) 5-325 MG tablet Take 1-2 tablets by mouth every 6 (six) hours as needed for moderate pain.    Yes Historical Provider, MD  lidocaine (LIDODERM) 5 % Place 1 patch onto the skin daily as needed (pain). Remove & Discard patch within 12 hours or as directed by MD   Yes Historical Provider, MD  loratadine (CLARITIN) 10 MG tablet Take 10 mg by mouth daily.   Yes Historical Provider, MD  metFORMIN (GLUCOPHAGE-XR) 500 MG 24 hr tablet TAKE 2 TABLETS BY MOUTH TWO TIMES DAILY 12/02/15  Yes [name], MD  metolazone (ZAROXOLYN) 5 MG tablet Take 5 mg by mouth daily as needed (weight gain and edema). Patient taking differently: Take 5 mg by mouth daily as needed (weight gain of >2 lbs/edema). Take 5 mg by mouth daily as needed (weight gain and edema) 09/10/15  Yes Larey Dresser, MD  Multiple Vitamin (MULTIVITAMIN WITH MINERALS) TABS tablet Take 1 tablet by mouth at bedtime.    Yes Historical Provider, MD  Multiple Vitamins-Minerals (PRESERVISION AREDS 2) CAPS Take 1 capsule by mouth 2 (two) times daily.    Yes Historical Provider, MD  polyvinyl alcohol (ARTIFICIAL TEARS) 1.4 % ophthalmic solution Place 1 drop into both eyes daily as needed for dry eyes.   Yes  Historical Provider, MD  potassium chloride SA (K-DUR,KLOR-CON) 20 MEQ tablet Take 1 tablet (20 mEq total) by mouth 2 (two) times daily. Patient taking differently: Take 20 mEq by mouth See admin instructions. Take 1 tablet (20 meq) by mouth twice daily, also take an extra tablet the morning after a metolazone tablet 08/05/15  Yes Vicie Mutters, PA-C  PRESCRIPTION MEDICATION Inhale into the lungs at bedtime. CPAP   Yes Historical Provider, MD  ranitidine (ZANTAC) 150 MG tablet Take 150 mg by mouth daily as needed for heartburn.    Yes Historical  Provider, MD  sertraline (ZOLOFT) 100 MG tablet TAKE 1 TABLET BY MOUTH DAILY 12/18/14  Yes [name], MD  torsemide (DEMADEX) 100 MG tablet Take 100 mg in AM and 50 mg in PM if needed for weight gain Patient taking differently: Take 50-100 mg by mouth See admin instructions. Take 1 tablet (100 mg) by mouth every morning, may take an additional 1/2 tablet (50 mg) if needed for weight gain of 2 lbs/24 hours 10/31/15  Yes Shaune Pascal Bensimhon, MD  TRULICITY 4.07 WK/0.8UP SOPN INJECT 1 PEN INTO THE SKIN ONCE A WEEK. Patient taking differently: Inject 0.75 mg into the skin every Tuesday.  08/21/15  Yes [name], MD  valsartan (DIOVAN) 320 MG tablet Take 160 mg by mouth daily.   Yes Historical Provider, MD  warfarin (COUMADIN) 5 MG tablet TAKE 1 TABLET BY MOUTH TWICE A DAY 12/02/15  Yes [name], MD  BAYER MICROLET LANCETS lancets Test once a day 05/12/15   Vicie Mutters, PA-C  Blood Glucose Monitoring Suppl (ACCU-CHEK AVIVA PLUS) w/Device KIT Check blood sugar 1 time  daily 07/12/15   [name], MD  glucose blood (ACCU-CHEK AVIVA PLUS) test strip Check blood glucose 3 xdaily.   [id number] - fluctuating glucoses - uncontrolled Diabetes 02/08/15 02/08/16  [name], MD    Family History Family History  Problem Relation Age of Onset  . Dementia Father   . Colon cancer Mother     Family History/Uncle   . Colon polyps Mother     Family  History  . Atrial fibrillation Mother   . Hypertension Mother   . Colon polyps Sister     Family history  . Diabetes Maternal Uncle   . Stroke Paternal Uncle     Social History Social History  Substance Use Topics  . Smoking status: Former Smoker    Packs/day: 4.00    Years: 25.00    Types: Cigarettes    Quit date: 01/05/1981  . Smokeless tobacco: Never Used  . Alcohol use 0.6 oz/week    1 Shots of liquor per week     Comment: 1-5 drinks per week     Allergies   Sunflower seed [sunflower oil]; Horse-derived products; and Tetanus toxoids   Review of Systems Review of Systems  Constitutional: Negative for chills and fever.  HENT: Negative.   Eyes: Negative for visual disturbance.  Respiratory: Negative for cough, chest tightness, shortness of breath and wheezing.   Cardiovascular: Positive for leg swelling (left lower leg). Negative for chest pain and palpitations.  Gastrointestinal: Negative for abdominal distention, abdominal pain, constipation, diarrhea, nausea and vomiting.  Genitourinary: Negative.   Musculoskeletal: Positive for arthralgias (chronic arthritis) and joint swelling (left foot).  Skin: Positive for color change (redness to left leg). Negative for rash and wound.  Neurological: Negative for dizziness, weakness, light-headedness and numbness.  Psychiatric/Behavioral: Negative for decreased concentration. The patient is not nervous/anxious.      Physical Exam Updated Vital Signs BP 136/73   Pulse 81   Temp 99 F (37.2 C) (Oral)   Resp 17   SpO2 98%   Physical Exam  Constitutional: He is oriented to person, place, and time. He appears well-developed and well-nourished. He appears distressed.  HENT:  Head: Normocephalic and atraumatic.  Eyes: Conjunctivae and EOM are normal.  Neck: Normal range of motion.  Cardiovascular: Normal rate, regular rhythm, S1 normal, S2 normal and intact distal pulses.   Murmur heard.  Systolic murmur is present    Pulmonary/Chest: No  accessory muscle usage. No respiratory distress. He has no decreased breath sounds. He has no wheezes. He has no rhonchi. He has no rales.  Abdominal: Soft. Normal appearance and bowel sounds are normal. There is no tenderness.  Musculoskeletal: Normal range of motion. He exhibits edema and tenderness.       Left ankle: He exhibits swelling. He exhibits no deformity, no laceration and normal pulse. Tenderness.       Feet:  Feet:  Left Foot:  Skin Integrity: Positive for erythema and warmth. Negative for ulcer, blister or skin breakdown.  Neurological: He is alert and oriented to person, place, and time. No sensory deficit.  Skin: Skin is warm and dry. Capillary refill takes less than 2 seconds. No rash noted. He is not diaphoretic.  Psychiatric: He has a normal mood and affect. His behavior is normal. Judgment and thought content normal.     ED Treatments / Results  Labs (all labs ordered are listed, but only abnormal results are displayed) Labs Reviewed  CBC WITH DIFFERENTIAL/PLATELET - Abnormal; Notable for the following:       Result Value   WBC 11.4 (*)    RBC 3.38 (*)    Hemoglobin 10.8 (*)    HCT 32.2 (*)    Platelets 149 (*)    Neutro Abs 10.0 (*)    Lymphs Abs 0.6 (*)    All other components within normal limits  BASIC METABOLIC PANEL - Abnormal; Notable for the following:    Chloride 99 (*)    Glucose, Bld 290 (*)    BUN 38 (*)    Creatinine, Ser 1.97 (*)    Calcium 8.8 (*)    GFR calc non Af Amer 31 (*)    GFR calc Af Amer 36 (*)    All other components within normal limits  CULTURE, BLOOD (ROUTINE X 2)  CULTURE, BLOOD (ROUTINE X 2)    EKG  EKG Interpretation None       Radiology Dg Foot Complete Left  Result Date: 12/23/2015 CLINICAL DATA:  75 year old male with left foot pain and swelling for 4 days. No known injury. Initial encounter. EXAM: LEFT FOOT - COMPLETE 3+ VIEW COMPARISON:  None. FINDINGS: Calcified peripheral vascular  disease.   No subcutaneous gas. Calcaneus appears intact with minimal degenerative spurring. Small chronic ossific fragment adjacent to degenerative spurring at the neck of the talus. Other tarsal bones appear intact. First MTP osteoarthritis with mild to moderate joint space loss and subchondral sclerosis. Healed chronic fracture of the fifth proximal phalanx suspected. No acute osseous abnormality identified. IMPRESSION: 1.  No acute osseous abnormality identified about the left foot. 2. Calcified peripheral vascular disease. Electronically Signed   By: Genevie Ann M.D.   On: 12/23/2015 12:17    Procedures Procedures (including critical care time)  Medications Ordered in ED Medications  vancomycin (VANCOCIN) IVPB 1000 mg/200 mL premix (not administered)  HYDROmorphone (DILAUDID) injection 1 mg (1 mg Intravenous Given 12/23/15 1658)    Initial Impression / Assessment and Plan / ED Course  I have reviewed the triage vital signs and the nursing notes.  Pertinent labs & imaging results that were available during my care of the patient were reviewed by me and considered in my medical decision making (see chart for details).  Clinical Course    Mr. Scallon is a 75yo male with PMH of T2DM, CKD stage 3, PAF on chronic coumadin, CAD s/p CABG, CHF, PFO, OSA presenting with progressive left foot pain, swelling  and redness for 4 days. On arrival, patient has low-grade fever, with otherwise stable vital signs. Exam of LLE suspicious for cellulitis with erythema streaking from great toe to medial malleolus. CBC shows leukocytosis with neutrophil predominance. BMP shows stable CKD. XR of foot does not indicate osteomyelitis or fracture. Patient discussed with [facility] hospitalist who agree with admission for parenteral antibiotics.  Patient updated on plan for admission and in agreement. Patient started given IV Dilaudid for pain control; blood cultures taken x 2, and will start Vancomycin IV per pharmacy dosing.  Patient with no further questions or concerns.   Final Clinical Impressions(s) / ED Diagnoses   Final diagnoses:  Cellulitis of left lower extremity   New Prescriptions New Prescriptions   No medications on file     Alphonzo Grieve, MD 12/23/15 1730    Duffy Bruce, MD 12/24/15 1014

## 2015-12-23 NOTE — ED Triage Notes (Signed)
Pt here for left foot pain since Saturday that came on suddenly. Has pain to left great toe and inner foot along his arch. Is very tender to bear weight. Foot appears swollen and warm to touch. Sensation intact. Has DM.

## 2015-12-23 NOTE — Progress Notes (Signed)
ANTICOAGULATION CONSULT NOTE - Initial Consult  Pharmacy Consult for Coumadin Indication: atrial fibrillation  Allergies  Allergen Reactions  . Sunflower Seed [Sunflower Oil] Swelling and Other (See Comments)    Tongue and lip swelling  . Horse-Derived Products Other (See Comments)    Per allergy skin test  . Tetanus Toxoids Other (See Comments)    Per allergy skin test    Patient Measurements: Height: 5' 11"  (180.3 cm) Weight: 191 lb 3.2 oz (86.7 kg) IBW/kg (Calculated) : 75.3  Vital Signs: Temp: 98.7 F (37.1 C) (12/18 1857) Temp Source: Oral (12/18 1857) BP: 117/61 (12/18 1857) Pulse Rate: 82 (12/18 1857)  Labs:  Recent Labs  12/23/15 1201 12/23/15 1845  HGB 10.8*  --   HCT 32.2*  --   PLT 149*  --   LABPROT  --  23.9*  INR  --  2.10  CREATININE 1.97*  --     Estimated Creatinine Clearance: 34 mL/min (by C-G formula based on SCr of 1.97 mg/dL (H)).   Medical History: Past Medical History:  Diagnosis Date  . Adrenal adenoma   . Anxiety   . Atypical atrial flutter ([place]) 8/15, 10/15   a. DCCV 08/2013. b. s/p RFA 10/2013.  [name] Basal cell carcinoma   . CAD (coronary artery disease)    a. 04/2013 CABG x 2: LIMA to LAD, SVG to RI, EVH via R thigh.  . Chronic diastolic congestive heart failure ([place])   . Depression   . Diabetes mellitus type II   . Diverticulosis 2001  . DJD (degenerative joint disease)   . GERD (gastroesophageal reflux disease)   . H/O hiatal hernia   . History of cardioversion    x3 (years uncertain)  . Hx of adenomatous colonic polyps   . Hyperlipidemia   . Hypertension   . Hypertensive cardiomyopathy ([place])   . Obstructive sleep apnea    compliant with CPAP  . 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 Persistent atrial fibrillation ([place])    a. s/p MAZE 04/2013 in setting of CABG. b. Amio stopped in 10/2013 after flutter ablation.   [name] PFO (patent foramen ovale)    a. Small PFO by TEE 10/2013.  [name] Pleural effusion, left    a. s/p thoracentesis 05/2013.  [name] Respiratory failure ([place])    a. Hypoxia 10/2013 - required supp O2 as inpatient, did not require it at discharge.  . S/P Maze operation for atrial fibrillation    a. 04/2013: Complete bilateral atrial lesion set using cryothermy and bipolar radiofrequency ablation with clipping of LA appendage (@ time of CABG)  . S/P Maze operation for atrial fibrillation 04/05/2013   Complete bilateral atrial lesion set using cryothermy and bipolar radiofrequency ablation with clipping of LA appendage via median sternotomy approach     Medications:  Prescriptions Prior to Admission  Medication Sig Dispense Refill Last Dose  . acetaminophen (TYLENOL) 500 MG tablet Take 500-1,000 mg by mouth every 6 (six) hours as needed (pain).   12/21/2015  . ALPRAZolam (XANAX) 1 MG tablet Take 0.5 tablets (0.5 mg total) by mouth at bedtime as needed for sleep. (Patient taking differently: Take 0.5-1 mg by mouth at bedtime as needed for sleep. ) 60 tablet 0 12/23/2015 at 300  . Alum Hydroxide-Mag Carbonate (GAVISCON PO) Take 1 tablet by mouth daily as needed (acid reflux).   6 months ago  . atorvastatin (LIPITOR) 40 MG  tablet TAKE 1 TABLET BY MOUTH EVERY DAY (Patient taking differently: TAKE 1/2 TABLET BY MOUTH ON MONDAY, WEDNESDAY, FRIDAY AT BEDTIME) 30 tablet 1 12/20/2015 at bedtime  . azelastine (ASTELIN) 0.1 % nasal spray Place 2 sprays into both nostrils 2 (two) times daily. Use in each nostril as directed (Patient taking differently: Place 2 sprays into both nostrils 2 (two) times daily as needed for allergies. Use in each nostril as directed) 30 mL 2 week ago  . B Complex Vitamins (VITAMIN B COMPLEX PO) Take 1 tablet by mouth at bedtime.    12/22/2015 at Unknown time  . carvedilol (COREG) 6.25 MG tablet TAKE 1 TABLET (6.25 MG TOTAL) BY MOUTH 2 (TWO) TIMES DAILY. 180 tablet 3 12/22/2015 at 2100  .  Cholecalciferol (VITAMIN D-3) 5000 units TABS Take 5,000 Units by mouth at bedtime.   12/22/2015 at Unknown time  . diphenhydrAMINE (BENADRYL) 25 MG tablet Take 25 mg by mouth at bedtime as needed for allergies.    week ago at Unknown time  . fluticasone (FLONASE) 50 MCG/ACT nasal spray Place 1 spray into both nostrils daily as needed for allergies.    2-3 weeks ago  . glimepiride (AMARYL) 4 MG tablet Take 1 tablet 3 times daily. (Patient taking differently: Take 4 mg by mouth 3 (three) times daily with meals. Take 1 tablet 3 times daily) 270 tablet 1 12/22/2015 at supper  . HYDROcodone-acetaminophen (NORCO/VICODIN) 5-325 MG tablet Take 1-2 tablets by mouth every 6 (six) hours as needed for moderate pain.    12/23/2015 at 300  . lidocaine (LIDODERM) 5 % Place 1 patch onto the skin daily as needed (pain). Remove & Discard patch within 12 hours or as directed by MD   12/22/2015 at Unknown time  . loratadine (CLARITIN) 10 MG tablet Take 10 mg by mouth daily.   12/22/2015 at Unknown time  . metFORMIN (GLUCOPHAGE-XR) 500 MG 24 hr tablet TAKE 2 TABLETS BY MOUTH TWO TIMES DAILY 360 tablet 1 12/22/2015 at pm  . metolazone (ZAROXOLYN) 5 MG tablet Take 5 mg by mouth daily as needed (weight gain and edema). (Patient taking differently: Take 5 mg by mouth daily as needed (weight gain of >2 lbs/edema). Take 5 mg by mouth daily as needed (weight gain and edema)) 20 tablet 1 2 weeks ago  . Multiple Vitamin (MULTIVITAMIN WITH MINERALS) TABS tablet Take 1 tablet by mouth at bedtime.    12/22/2015 at Unknown time  . Multiple Vitamins-Minerals (PRESERVISION AREDS 2) CAPS Take 1 capsule by mouth 2 (two) times daily.    12/22/2015 at pm  . polyvinyl alcohol (ARTIFICIAL TEARS) 1.4 % ophthalmic solution Place 1 drop into both eyes daily as needed for dry eyes.   12/22/2015 at Unknown time  . potassium chloride SA (K-DUR,KLOR-CON) 20 MEQ tablet Take 1 tablet (20 mEq total) by mouth 2 (two) times daily. (Patient taking  differently: Take 20 mEq by mouth See admin instructions. Take 1 tablet (20 meq) by mouth twice daily, also take an extra tablet the morning after a metolazone tablet) 180 tablet 4 12/22/2015 at pm  . PRESCRIPTION MEDICATION Inhale into the lungs at bedtime. CPAP   12/22/2015 at Unknown time  . ranitidine (ZANTAC) 150 MG tablet Take 150 mg by mouth daily as needed for heartburn.    6 months ago  . sertraline (ZOLOFT) 100 MG tablet TAKE 1 TABLET BY MOUTH DAILY 30 tablet 11 12/22/2015 at Unknown time  . torsemide (DEMADEX) 100 MG tablet Take 100 mg in  AM and 50 mg in PM if needed for weight gain (Patient taking differently: Take 50-100 mg by mouth See admin instructions. Take 1 tablet (100 mg) by mouth every morning, may take an additional 1/2 tablet (50 mg) if needed for weight gain of 2 lbs/24 hours) 180 tablet 3 12/22/2015 at Unknown time  . TRULICITY 7.35 DI/9.7OE SOPN INJECT 1 PEN INTO THE SKIN ONCE A WEEK. (Patient taking differently: Inject 0.75 mg into the skin every Tuesday. ) 0.5 mL 3 12/17/2015  . valsartan (DIOVAN) 320 MG tablet Take 160 mg by mouth daily.   12/22/2015 at Unknown time  . warfarin (COUMADIN) 5 MG tablet TAKE 1 TABLET BY MOUTH TWICE A DAY 180 tablet 3 12/22/2015 at 2200  . BAYER MICROLET LANCETS lancets Test once a day 100 each 3 Taking  . Blood Glucose Monitoring Suppl (ACCU-CHEK AVIVA PLUS) w/Device KIT Check blood sugar 1 time  daily 1 kit 0 Taking  . glucose blood (ACCU-CHEK AVIVA PLUS) test strip Check blood glucose 3 xdaily.   DX-E11.29 - fluctuating glucoses - uncontrolled Diabetes 300 each 12 Taking    Assessment: 75 yo M admitted 12/23/2015 with cellulitis and started on IV antibiotics.  Pt on Coumadin PTA for hx afib.  Pharmacy asked to resume dosing.    PTA Mr. More took his Coumadin as 36m BID (as opposed to 140mdaily).  He missed his morning dose today.  INR is therapeutic.  Goal of Therapy:  INR 2-3 Monitor platelets by anticoagulation protocol: Yes    Plan:  Coumadin 1066mO x 1 tonight Daily INR  KimManpower Incharm.D., BCPS Clinical Pharmacist Pager [phone]/18/2017 8:50 PM

## 2015-12-24 ENCOUNTER — Encounter: Payer: Self-pay | Admitting: *Deleted

## 2015-12-24 ENCOUNTER — Encounter (HOSPITAL_COMMUNITY): Payer: Self-pay | Admitting: General Practice

## 2015-12-24 DIAGNOSIS — E1122 Type 2 diabetes mellitus with diabetic chronic kidney disease: Secondary | ICD-10-CM

## 2015-12-24 LAB — BASIC METABOLIC PANEL
ANION GAP: 8 (ref 5–15)
BUN: 32 mg/dL — ABNORMAL HIGH (ref 6–20)
CHLORIDE: 101 mmol/L (ref 101–111)
CO2: 30 mmol/L (ref 22–32)
Calcium: 8.6 mg/dL — ABNORMAL LOW (ref 8.9–10.3)
Creatinine, Ser: 1.81 mg/dL — ABNORMAL HIGH (ref 0.61–1.24)
GFR calc non Af Amer: 35 mL/min — ABNORMAL LOW (ref >60)
GFR, EST AFRICAN AMERICAN: 40 mL/min — AB (ref >60)
GLUCOSE: 99 mg/dL (ref 65–99)
Potassium: 4.3 mmol/L (ref 3.5–5.1)
Sodium: 139 mmol/L (ref 135–145)

## 2015-12-24 LAB — GLUCOSE, CAPILLARY
GLUCOSE-CAPILLARY: 133 mg/dL — AB (ref 65–99)
GLUCOSE-CAPILLARY: 172 mg/dL — AB (ref 65–99)
Glucose-Capillary: 173 mg/dL — ABNORMAL HIGH (ref 65–99)
Glucose-Capillary: 156 mg/dL — ABNORMAL HIGH (ref 65–99)

## 2015-12-24 LAB — CBC
HCT: 30.3 % — ABNORMAL LOW (ref 39.0–52.0)
HEMOGLOBIN: 10.1 g/dL — AB (ref 13.0–17.0)
MCH: 31.9 pg (ref 26.0–34.0)
MCHC: 33.3 g/dL (ref 30.0–36.0)
MCV: 95.6 fL (ref 78.0–100.0)
Platelets: 146 10*3/uL — ABNORMAL LOW (ref 150–400)
RBC: 3.17 MIL/uL — AB (ref 4.22–5.81)
RDW: 13.6 % (ref 11.5–15.5)
WBC: 10.3 10*3/uL (ref 4.0–10.5)

## 2015-12-24 LAB — PROTIME-INR
INR: 2.11
Prothrombin Time: 24 seconds — ABNORMAL HIGH (ref 11.4–15.2)

## 2015-12-24 MED ORDER — WARFARIN SODIUM 5 MG PO TABS
10.0000 mg | ORAL_TABLET | Freq: Once | ORAL | Status: AC
  Administered 2015-12-24: 10 mg via ORAL
  Filled 2015-12-24: qty 2

## 2015-12-24 MED ORDER — SALINE SPRAY 0.65 % NA SOLN
1.0000 | NASAL | Status: DC | PRN
  Filled 2015-12-24: qty 44

## 2015-12-24 MED ORDER — PROSIGHT PO TABS
1.0000 | ORAL_TABLET | Freq: Two times a day (BID) | ORAL | Status: DC
  Administered 2015-12-24 – 2015-12-27 (×7): 1 via ORAL
  Filled 2015-12-24 (×7): qty 1

## 2015-12-24 NOTE — Progress Notes (Signed)
Provider notified patient is first degree block with two missed beats 5 minutes apart with RR 2.1.

## 2015-12-24 NOTE — Care Management Note (Addendum)
Case Management Note  Patient Details  Name: BURK HOCTOR MRN: [id number] Date of Birth: 15-Dec-1940  Subjective/Objective:                 Patient admitted from home with wife with cellulitis. DM educator consult while IP. IV Abx. RW to be delivered to room. Pt unable to tolerate weight to foot.  Pulm [facility] Cards [facility] PCP [place]    Action/Plan:  No home needs identified, will continue to monitor.  Expected Discharge Date:                  Expected Discharge Plan:  Home/Self Care  In-House Referral:     Discharge planning Services  CM Consult  Post Acute Care Choice:    Choice offered to:     DME Arranged:    DME Agency:     HH Arranged:    HH Agency:     Status of Service:  In process, will continue to follow  If discussed at Long Length of Stay Meetings, dates discussed:    Additional Comments:  Carles Collet, RN 12/24/2015, 11:19 AM

## 2015-12-24 NOTE — Progress Notes (Signed)
PROGRESS NOTE        PATIENT DETAILS Name: [name] Age: 75 y.o. Sex: male Date of Birth: October 28, 1940 Admit Date: 12/23/2015 Admitting Physician Evalee Mutton Kristeen Mans, MD URK:[id number] DAVID, MD  Brief Narrative: Patient is a 75 y.o. 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 Found to have cellulitis and started on intravenous vancomycin. Clinically improving, see below for further details  Subjective: Decreasing edema and pain in the left foot. Febrile overnight.  Assessment/Plan: Cellulitis of the left foot: Improved, though still tender-has significantly decreased edema and erythema. Continue to elevate left leg, continue intravenous vancomycin. Blood cultures negative so far.  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 Follow electrolytes, if any further worsening in renal function may need to hold losartan.  Chronic diastolic congestive heart failure: Compensated, continue with Demadex.  CKD (chronic kidney disease) stage 3, GFR 30-59 ml/min: creatinine close to usual baseline, follow.  GERD: Continue PPI  Atypical atrial flutter s/p RFA 10/26/13: Rate controlled with Coreg, pharmacy to dose Coumadin while inpatient.INR therapeutic.  DVT Prophylaxis: Full dose anticoagulation with Coumadin  Code Status: Full code  Family Communication: None at bedside  Disposition Plan: Remain inpatient-home in next few days  Antimicrobial agents: See below  Procedures: None  CONSULTS:  None  Time spent: 25 minutes-Greater than 50% of this time was spent in counseling, explanation of diagnosis, planning of further management, and coordination of  care.  MEDICATIONS: Anti-infectives    Start     Dose/Rate Route Frequency Ordered Stop   12/23/15 1700  vancomycin (VANCOCIN) IVPB 1000 mg/200 mL premix     1,000 mg 200 mL/hr over 60 Minutes Intravenous Every 24 hours 12/23/15 1647        Scheduled Meds: . atorvastatin  40 mg Oral q1800  . carvedilol  6.25 mg Oral BID WC  . cholecalciferol  5,000 Units Oral QHS  . insulin aspart  0-9 Units Subcutaneous TID WC  . irbesartan  150 mg Oral Daily  . loratadine  10 mg Oral Daily  . multivitamin  1 tablet Oral BID  . multivitamin with minerals  1 tablet Oral QHS  . potassium chloride SA  20 mEq Oral BID  . sertraline  100 mg Oral Daily  . sodium chloride flush  3 mL Intravenous Q12H  . torsemide  100 mg Oral Daily  . vancomycin  1,000 mg Intravenous Q24H  . warfarin  10 mg Oral ONCE-1800  . Warfarin - Pharmacist Dosing Inpatient   Does not apply q1800   Continuous Infusions: PRN Meds:.acetaminophen **OR** acetaminophen, acetaminophen, albuterol, ALPRAZolam, fluticasone, HYDROcodone-acetaminophen, morphine injection, ondansetron **OR** ondansetron (ZOFRAN) IV, polyvinyl alcohol, sodium chloride   PHYSICAL EXAM: Vital signs: Vitals:   12/24/15 0500 12/24/15 0601 12/24/15 0715 12/24/15 0858  BP:  (!) 100/46 123/69 129/68  Pulse:  71    Resp:      Temp:  99.2 F (37.3 C)    TempSrc:  Oral    SpO2:  99%    Weight: 87.1 kg (192 lb 1.6 oz)     Height:  Filed Weights   12/23/15 1852 12/24/15 0500  Weight: 86.7 kg (191 lb 3.2 oz) 87.1 kg (192 lb 1.6 oz)   Body mass index is 26.79 kg/m.   General appearance :Awake, alert, not in any distress. Speech Clear. Not toxic Looking Eyes:, pupils equally reactive to light and accomodation,no scleral icterus.Pink conjunctiva HEENT: Atraumatic and Normocephalic Neck: supple, no JVD. No cervical lymphadenopathy. No thyromegaly Resp:Good air entry bilaterally, no added sounds  CVS: S1 S2 regular, no murmurs.  GI: Bowel sounds  present, Non tender and not distended with no gaurding, rigidity or rebound.No organomegaly Extremities: Left foot with decreased edema-erythema in the medial mid foot has decreased substantially. Continues to be tender. Left foot is not tense, good capillary refill. Right lower extremity is within normal limits. Neurology:  speech clear,Non focal, sensation is grossly intact. Psychiatric: Normal judgment and insight. Alert and oriented x 3. Normal mood. Musculoskeletal:No digital cyanosis Skin:No Rash, warm and dry Wounds:N/A  I have personally reviewed following labs and imaging studies  LABORATORY DATA: CBC:  Recent Labs Lab 12/23/15 1201 12/24/15 0501  WBC 11.4* 10.3  NEUTROABS 10.0*  --   HGB 10.8* 10.1*  HCT 32.2* 30.3*  MCV 95.3 95.6  PLT 149* 146*    Basic Metabolic Panel:  Recent Labs Lab 12/23/15 1201 12/24/15 0501  NA 139 139  K 4.0 4.3  CL 99* 101  CO2 27 30  GLUCOSE 290* 99  BUN 38* 32*  CREATININE 1.97* 1.81*  CALCIUM 8.8* 8.6*    GFR: Estimated Creatinine Clearance: 37 mL/min (by C-G formula based on SCr of 1.81 mg/dL (H)).  Liver Function Tests: No results for input(s): AST, ALT, ALKPHOS, BILITOT, PROT, ALBUMIN in the last 168 hours. No results for input(s): LIPASE, AMYLASE in the last 168 hours. No results for input(s): AMMONIA in the last 168 hours.  Coagulation Profile:  Recent Labs Lab 12/23/15 1845 12/24/15 0501  INR 2.10 2.11    Cardiac Enzymes: No results for input(s): CKTOTAL, CKMB, CKMBINDEX, TROPONINI in the last 168 hours.  BNP (last 3 results) No results for input(s): PROBNP in the last 8760 hours.  HbA1C: No results for input(s): HGBA1C in the last 72 hours.  CBG:  Recent Labs Lab 12/23/15 2307 12/24/15 0805 12/24/15 1225  GLUCAP 133* 133* 173*    Lipid Profile: No results for input(s): CHOL, HDL, LDLCALC, TRIG, CHOLHDL, LDLDIRECT in the last 72 hours.  Thyroid Function Tests: No results for input(s): TSH,  T4TOTAL, FREET4, T3FREE, THYROIDAB in the last 72 hours.  Anemia Panel: No results for input(s): VITAMINB12, FOLATE, FERRITIN, TIBC, IRON, RETICCTPCT in the last 72 hours.  Urine analysis:    Component Value Date/Time   COLORURINE YELLOW 03/05/2014 1627   APPEARANCEUR CLEAR 03/05/2014 1627   LABSPEC 1.020 03/05/2014 1627   PHURINE 5.0 03/05/2014 1627   GLUCOSEU NEG 03/05/2014 1627   GLUCOSEU 500 06/04/2010 1626   HGBUR NEG 03/05/2014 1627   BILIRUBINUR NEG 03/05/2014 1627   KETONESUR NEG 03/05/2014 1627   PROTEINUR NEG 03/05/2014 1627   UROBILINOGEN 0.2 03/05/2014 1627   NITRITE NEG 03/05/2014 1627   LEUKOCYTESUR NEG 03/05/2014 1627    Sepsis Labs: Lactic Acid, Venous No results found for: LATICACIDVEN  MICROBIOLOGY: Recent Results (from the past 240 hour(s))  Culture, blood (routine x 2)     Status: None (Preliminary result)   Collection Time: 12/23/15  5:00 PM  Result Value Ref Range Status   Specimen Description BLOOD RIGHT FOREARM  Final   Special  Requests BOTTLES DRAWN AEROBIC AND ANAEROBIC 5CC  Final   Culture NO GROWTH < 24 HOURS  Final   Report Status PENDING  Incomplete  Culture, blood (routine x 2)     Status: None (Preliminary result)   Collection Time: 12/23/15  6:56 PM  Result Value Ref Range Status   Specimen Description BLOOD RIGHT ANTECUBITAL  Final   Special Requests BOTTLES DRAWN AEROBIC AND ANAEROBIC 5CC  Final   Culture NO GROWTH < 24 HOURS  Final   Report Status PENDING  Incomplete    RADIOLOGY STUDIES/RESULTS: Dg Foot Complete Left  Result Date: 12/23/2015 CLINICAL DATA:  75 year old male with left foot pain and swelling for 4 days. No known injury. Initial encounter. EXAM: LEFT FOOT - COMPLETE 3+ VIEW COMPARISON:  None. FINDINGS: Calcified peripheral vascular disease.   No subcutaneous gas. Calcaneus appears intact with minimal degenerative spurring. Small chronic ossific fragment adjacent to degenerative spurring at the neck of the talus. Other  tarsal bones appear intact. First MTP osteoarthritis with mild to moderate joint space loss and subchondral sclerosis. Healed chronic fracture of the fifth proximal phalanx suspected. No acute osseous abnormality identified. IMPRESSION: 1.  No acute osseous abnormality identified about the left foot. 2. Calcified peripheral vascular disease. Electronically Signed   By: Genevie Ann M.D.   On: 12/23/2015 12:17     LOS: 1 day   Oren Binet, MD  [facility] Pager:[phone]  If 7PM-7AM, please contact night-coverage [email or web address] Password [facility] 12/24/2015, 4:34 PM

## 2015-12-24 NOTE — Progress Notes (Signed)
Inpatient Diabetes Program Recommendations  AACE/ADA: New Consensus Statement on Inpatient Glycemic Control (2015)  Target Ranges:  Prepandial:   less than 140 mg/dL      Peak postprandial:   less than 180 mg/dL (1-2 hours)      Critically ill patients:  140 - 180 mg/dL   Lab Results  Component Value Date   GLUCAP 133 (H) 12/24/2015   HGBA1C 8.0 (H) 12/03/2015    Review of Glycemic Control  Diabetes history:     DM2 Outpatient Diabetes medications:     Trulicity 7.01 mg once per week on Tuesday - due today, however should not be given based on side effects     Metformin XR 1000 mg BID    Amaryl 4 mg TIDAC Current orders for Inpatient glycemic control:     Novolog 0-9 units TIDAC sensitive correction  Inpatient Diabetes Program Recommendations:     Since patient was on 2 oral medications, may want to consider moderate corrections if CBG's trend upward (Novolog 0-15 units TIDAC and 0-5 units QHS).     Will watch trends.  Thank you,  Windy Carina, RN, BSN Diabetes Coordinator Inpatient Diabetes Program [phone] (Team Pager)

## 2015-12-24 NOTE — Patient Outreach (Signed)
[facility]) Care Management  12/17/2015  [name] Jul 01, 1940 [id number]  Subjective: RN Health Coach telephone call to patient.  [place] compliance verified. Per patient he is doing good. Per patient his A1C is down some 8.0 from 8.3. Patient is maintaining diet. Patient blood sugars are running @ 140 fasting. RN discussed with patient about eating Hs snacks. Patient is checking feet daily. Patient has not had any hypo or hyperglycemic reactions since last outreach call. Patient is maintaining his exercise routine. Patient agreed to follow up outreach calls   Objective:   Current Medications:  No current facility-administered medications for this visit.    No current outpatient prescriptions on file.   Facility-Administered Medications Ordered in Other Visits  Medication Dose Route Frequency Provider Last Rate Last Dose  . acetaminophen (TYLENOL) tablet 650 mg  650 mg Oral Q6H PRN Jonetta Osgood, MD   650 mg at 12/24/15 0035   Or  . acetaminophen (TYLENOL) suppository 650 mg  650 mg Rectal Q6H PRN Jonetta Osgood, MD      . acetaminophen (TYLENOL) tablet 500-1,000 mg  500-1,000 mg Oral Q6H PRN Shanker Kristeen Mans, MD      . albuterol (PROVENTIL) (2.5 MG/3ML) 0.083% nebulizer solution 2.5 mg  2.5 mg Nebulization Q2H PRN Shanker Kristeen Mans, MD      . ALPRAZolam Duanne Moron) tablet 0.5 mg  0.5 mg Oral QHS PRN Jonetta Osgood, MD      . atorvastatin (LIPITOR) tablet 40 mg  40 mg Oral q1800 Jonetta Osgood, MD   40 mg at 12/23/15 2104  . carvedilol (COREG) tablet 6.25 mg  6.25 mg Oral BID WC Jonetta Osgood, MD   6.25 mg at 12/24/15 0910  . cholecalciferol (VITAMIN D) tablet 5,000 Units  5,000 Units Oral QHS Jonetta Osgood, MD   5,000 Units at 12/23/15 2103  . fluticasone (FLONASE) 50 MCG/ACT nasal spray 1 spray  1 spray Each Nare Daily PRN Jonetta Osgood, MD   1 spray at 12/24/15 0029  . HYDROcodone-acetaminophen (NORCO/VICODIN) 5-325 MG per tablet 1-2 tablet   1-2 tablet Oral Q6H PRN Jonetta Osgood, MD   1 tablet at 12/23/15 2102  . insulin aspart (novoLOG) injection 0-9 Units  0-9 Units Subcutaneous TID WC Jonetta Osgood, MD   1 Units at 12/24/15 0910  . irbesartan (AVAPRO) tablet 150 mg  150 mg Oral Daily Jonetta Osgood, MD   150 mg at 12/24/15 0909  . loratadine (CLARITIN) tablet 10 mg  10 mg Oral Daily Jonetta Osgood, MD   10 mg at 12/24/15 0910  . morphine 2 MG/ML injection 1 mg  1 mg Intravenous Q4H PRN Jonetta Osgood, MD   1 mg at 12/24/15 0909  . multivitamin (PROSIGHT) tablet 1 tablet  1 tablet Oral BID Jonetta Osgood, MD   1 tablet at 12/24/15 0908  . multivitamin with minerals tablet 1 tablet  1 tablet Oral QHS Jonetta Osgood, MD   1 tablet at 12/23/15 2105  . ondansetron (ZOFRAN) tablet 4 mg  4 mg Oral Q6H PRN Shanker Kristeen Mans, MD       Or  . ondansetron (ZOFRAN) injection 4 mg  4 mg Intravenous Q6H PRN Shanker Kristeen Mans, MD      . polyvinyl alcohol (LIQUIFILM TEARS) 1.4 % ophthalmic solution 1 drop  1 drop Both Eyes Daily PRN Shanker Kristeen Mans, MD      . potassium chloride SA (K-DUR,KLOR-CON) CR  tablet 20 mEq  20 mEq Oral BID Jonetta Osgood, MD   20 mEq at 12/24/15 0910  . sertraline (ZOLOFT) tablet 100 mg  100 mg Oral Daily Jonetta Osgood, MD   100 mg at 12/24/15 0910  . sodium chloride (OCEAN) 0.65 % nasal spray 1 spray  1 spray Each Nare PRN Shanker Kristeen Mans, MD      . sodium chloride flush (NS) 0.9 % injection 3 mL  3 mL Intravenous Q12H Jonetta Osgood, MD   3 mL at 12/24/15 0922  . torsemide (DEMADEX) tablet 100 mg  100 mg Oral Daily Jonetta Osgood, MD   100 mg at 12/24/15 0910  . vancomycin (VANCOCIN) IVPB 1000 mg/200 mL premix  1,000 mg Intravenous Q24H Rebecka Apley, RPH 200 mL/hr at 12/23/15 1822 1,000 mg at 12/23/15 3790  . Warfarin - Pharmacist Dosing Inpatient   Does not apply q1800 Terrace Arabia, RPH        Functional Status:  In your present state of health, do you have any difficulty  performing the following activities: 12/24/2015 12/17/2015  Hearing? N N  Vision? N N  Difficulty concentrating or making decisions? N N  Walking or climbing stairs? Y Y  Dressing or bathing? N N  Doing errands, shopping? - Scientist, forensic and eating ? - N  Using the Toilet? - N  In the past six months, have you accidently leaked urine? - N  Do you have problems with loss of bowel control? - N  Managing your Medications? - N  Managing your Finances? - N  Housekeeping or managing your Housekeeping? - N  Some recent data might be hidden    Fall/Depression Screening: PHQ 2/9 Scores 12/17/2015 12/03/2015 10/24/2015 09/24/2015 08/05/2015 07/24/2015 06/24/2015  PHQ - 2 Score 0 0 0 0 0 0 0   THN CM Care Plan Problem One   Flowsheet Row Most Recent Value  Care Plan Problem One  Knowlwdge deficit in self management of Diabetes  Role Documenting the Problem One  [place] for Problem One  Active  THN Long Term Goal (31-90 days)  Patient will show a decrease in A1C by one point in 90 days  Interventions for Problem One Long Term Goal  RN sent educational material on your goal of A1C, How the A1C helps , The A1C test.  Controlling your blood sugar, Patient medicationa has been changed and patient feels it is controlling his blood sugars better. RN will follow up with a month for teachback and discussion  THN CM Short Term Goal #4 (0-30 days)  Patient will be able to verbalize what his cholesterol level results mean within 30 days  THN CM Short Term Goal #4 Start Date  12/17/15  Interventions for Short Term Goal #4  RN sent patient EMMI information about cholesterol and lifestyle, Cholesterol testing and results. RN will follow up with discussion and teachback regarding his results.  THN CM Short Term Goal #5 (0-30 days)  Patient will verbalize his am fasting blood suguars are under 150 within the 30-60 days  THN CM Short Term Goal #5 Met Date  12/17/15  Interventions for Short Term  Goal #5  RN discussed with patient how the blood sugars are affecting the A1c level. RN sent a living well with diabetes book to patient. RN will follow up to check on ranges.      Assessment: A1C is 8.0  decreased by .3  Patient  will continue to benefit from [place] telephonic outreach for education and support for diabetes self management. Plan:  RN discussed A1C results and reiterated  how the diet and regulation of blood sugar affects the results RN discussed Holiday eating and maintaining diet RN will follow up within the month of January  Dontea Corlew [place] [facility] [phone]

## 2015-12-24 NOTE — Progress Notes (Signed)
[place] for Coumadin Indication: atrial fibrillation  Allergies  Allergen Reactions  . Sunflower Seed [Sunflower Oil] Swelling and Other (See Comments)    Tongue and lip swelling  . Horse-Derived Products Other (See Comments)    Per allergy skin test  . Tetanus Toxoids Other (See Comments)    Per allergy skin test    Patient Measurements: Height: 5\' 11"  (180.3 cm) Weight: 192 lb 1.6 oz (87.1 kg) IBW/kg (Calculated) : 75.3  Vital Signs: Temp: 99.2 F (37.3 C) (12/19 0601) Temp Source: Oral (12/19 0601) BP: 129/68 (12/19 0858) Pulse Rate: 71 (12/19 0601)  Labs:  Recent Labs  12/23/15 1201 12/23/15 1845 12/24/15 0501  HGB 10.8*  --  10.1*  HCT 32.2*  --  30.3*  PLT 149*  --  146*  LABPROT  --  23.9* 24.0*  INR  --  2.10 2.11  CREATININE 1.97*  --  1.81*    Estimated Creatinine Clearance: 37 mL/min (by C-G formula based on SCr of 1.81 mg/dL (H)).    Assessment: 75 yo M admitted 12/24/2015 with cellulitis and started on IV antibiotics.  Pt on Coumadin PTA for hx afib.      PTA Mr. Slivinski took his Coumadin as 5mg  BID (as opposed to 10mg  daily).  He missed his morning dose today.  INR is therapeutic on admission and today  Goal of Therapy:  INR 2-3 Monitor platelets by anticoagulation protocol: Yes   Plan:  Coumadin 10mg  PO x 1 tonight Daily INR  Thank you Anette Guarneri, PharmD [phone] 12/24/2015 11:13 AM

## 2015-12-24 NOTE — Progress Notes (Signed)
Baltazar Najjar, NP notified about pt vital signs at [place].

## 2015-12-24 NOTE — Consult Note (Signed)
   [facility] CM Inpatient Consult   12/24/2015  [name] 10-07-1940 [id number]     [name] has been active with [place]. Please see chart review then notes for patient outreach details. Spoke with [name] and wife at bedside. They are agreeable to ongoing [place] [facility] follow up. Written consent obtained. Explained that he will receive post hospital discharge calls. [name] is in with cellulitis. Spoke with inpatient RNCM aware that [name] will be followed by [facility] RNCM post hospitalization.   Cory Rolling, MSN-Ed, RN,BSN [facility] Liaison [phone]

## 2015-12-25 ENCOUNTER — Other Ambulatory Visit: Payer: Self-pay

## 2015-12-25 DIAGNOSIS — K210 Gastro-esophageal reflux disease with esophagitis: Secondary | ICD-10-CM

## 2015-12-25 DIAGNOSIS — Z9989 Dependence on other enabling machines and devices: Secondary | ICD-10-CM

## 2015-12-25 DIAGNOSIS — G4733 Obstructive sleep apnea (adult) (pediatric): Secondary | ICD-10-CM

## 2015-12-25 LAB — BASIC METABOLIC PANEL
Anion gap: 12 (ref 5–15)
BUN: 42 mg/dL — AB (ref 6–20)
CO2: 25 mmol/L (ref 22–32)
CREATININE: 2.17 mg/dL — AB (ref 0.61–1.24)
Calcium: 8.3 mg/dL — ABNORMAL LOW (ref 8.9–10.3)
Chloride: 100 mmol/L — ABNORMAL LOW (ref 101–111)
GFR calc Af Amer: 32 mL/min — ABNORMAL LOW (ref >60)
GFR calc non Af Amer: 28 mL/min — ABNORMAL LOW (ref >60)
Glucose, Bld: 145 mg/dL — ABNORMAL HIGH (ref 65–99)
POTASSIUM: 4.1 mmol/L (ref 3.5–5.1)
SODIUM: 137 mmol/L (ref 135–145)

## 2015-12-25 LAB — GLUCOSE, CAPILLARY
GLUCOSE-CAPILLARY: 252 mg/dL — AB (ref 65–99)
GLUCOSE-CAPILLARY: 169 mg/dL — AB (ref 65–99)
Glucose-Capillary: 150 mg/dL — ABNORMAL HIGH (ref 65–99)
Glucose-Capillary: 173 mg/dL — ABNORMAL HIGH (ref 65–99)

## 2015-12-25 LAB — PROTIME-INR
INR: 1.77
PROTHROMBIN TIME: 20.9 s — AB (ref 11.4–15.2)

## 2015-12-25 LAB — URIC ACID: URIC ACID, SERUM: 9 mg/dL — AB (ref 4.4–7.6)

## 2015-12-25 MED ORDER — WARFARIN SODIUM 7.5 MG PO TABS
12.5000 mg | ORAL_TABLET | Freq: Once | ORAL | Status: AC
  Administered 2015-12-25: 19:00:00 12.5 mg via ORAL
  Filled 2015-12-25: qty 1

## 2015-12-25 NOTE — Procedures (Signed)
Patient did not want to wear CPAP tonight. Patient is aware to ask RN to call Respiratory if he changes his mind

## 2015-12-25 NOTE — Progress Notes (Signed)
[place] for Coumadin Indication: atrial fibrillation  Allergies  Allergen Reactions  . Sunflower Seed [Sunflower Oil] Swelling and Other (See Comments)    Tongue and lip swelling  . Horse-Derived Products Other (See Comments)    Per allergy skin test  . Tetanus Toxoids Other (See Comments)    Per allergy skin test    Patient Measurements: Height: 5\' 11"  (180.3 cm) Weight: 196 lb 13.9 oz (89.3 kg) IBW/kg (Calculated) : 75.3  Vital Signs: Temp: 99.4 F (37.4 C) (12/20 0551) Temp Source: Oral (12/20 0551) BP: 119/64 (12/20 0825) Pulse Rate: 83 (12/20 0825)  Labs:  Recent Labs  12/23/15 1201 12/23/15 1845 12/24/15 0501 12/25/15 0611  HGB 10.8*  --  10.1*  --   HCT 32.2*  --  30.3*  --   PLT 149*  --  146*  --   LABPROT  --  23.9* 24.0* 20.9*  INR  --  2.10 2.11 1.77  CREATININE 1.97*  --  1.81* 2.17*    Estimated Creatinine Clearance: 30.8 mL/min (by C-G formula based on SCr of 2.17 mg/dL (H)).    Assessment: 75 yo M admitted  with cellulitis and started on IV antibiotics. Now on Day # 3 of vancomycin with cellulitis improvement   Pt on Coumadin PTA for hx afib.      PTA Mr. Hemann took his Coumadin as 5mg  BID (as opposed to 10mg  daily).  He missed his morning dose today.  INR is therapeutic on admission.  INR decreased today to 1.77, no bleeding noted  Goal of Therapy:  INR 2-3 Monitor platelets by anticoagulation protocol: Yes   Plan:  Coumadin 12.5mg  PO x 1 tonight Daily INR  Continue Vancomycin 1 gram iv Q 24 (noted increase in Scr)  Thank you Anette Guarneri, PharmD [phone] 12/25/2015 11:03 AM

## 2015-12-25 NOTE — Progress Notes (Signed)
PROGRESS NOTE        PATIENT DETAILS Name: [name] Age: 75 y.o. Sex: male Date of Birth: 09-21-1940 Admit Date: 12/23/2015 Admitting Physician Evalee Mutton Kristeen Mans, MD GUR:[id number],JSEGBTD DAVID, MD  Brief Narrative: Patient is a 75 y.o. 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 Found to have cellulitis and started on intravenous vancomycin. Clinically improving, see below for further details  Subjective: Fever of 100.2 last night, overall he feels he is improving still not able to bear weight on it  Assessment/Plan:  Cellulitis of the left foot:  Improved, though still tender-has significantly decreased edema and erythema.  Continue to elevate left leg, continue intravenous vancomycin. Blood cultures negative so far.  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 Follow electrolytes, if any further worsening in renal function may need to hold losartan.  Chronic diastolic congestive heart failure:  Compensated, on Demadex, continued.  CKD (chronic kidney disease) stage 3, GFR 30-59 ml/min:  creatinine close to usual baseline, follow-up closely, check BMP in a.m. 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  DVT Prophylaxis: Full dose anticoagulation with Coumadin, subtherapeutic INR of 1.7.  Code Status: Full code  Family Communication: None at bedside  Disposition Plan: Remain inpatient-home in next few days  Antimicrobial agents: See below  Procedures: None  CONSULTS:  None  Time spent: 25 minutes-Greater than  50% of this time was spent in counseling, explanation of diagnosis, planning of further management, and coordination of care.  MEDICATIONS: Anti-infectives    Start     Dose/Rate Route Frequency Ordered Stop   12/23/15 1700  vancomycin (VANCOCIN) IVPB 1000 mg/200 mL premix     1,000 mg 200 mL/hr over 60 Minutes Intravenous Every 24 hours 12/23/15 1647        Scheduled Meds: . atorvastatin  40 mg Oral q1800  . carvedilol  6.25 mg Oral BID WC  . cholecalciferol  5,000 Units Oral QHS  . insulin aspart  0-9 Units Subcutaneous TID WC  . irbesartan  150 mg Oral Daily  . loratadine  10 mg Oral Daily  . multivitamin  1 tablet Oral BID  . multivitamin with minerals  1 tablet Oral QHS  . potassium chloride SA  20 mEq Oral BID  . sertraline  100 mg Oral Daily  . sodium chloride flush  3 mL Intravenous Q12H  . torsemide  100 mg Oral Daily  . vancomycin  1,000 mg Intravenous Q24H  . warfarin  12.5 mg Oral ONCE-1800  . Warfarin - Pharmacist Dosing Inpatient   Does not apply q1800   Continuous Infusions: PRN Meds:.acetaminophen **OR** acetaminophen, acetaminophen, albuterol, ALPRAZolam, fluticasone, HYDROcodone-acetaminophen, morphine injection, ondansetron **OR** ondansetron (ZOFRAN) IV, polyvinyl alcohol, sodium chloride   PHYSICAL EXAM: Vital signs: Vitals:   12/24/15 2111 12/25/15 0500 12/25/15 0551 12/25/15 0825  BP: (!) 103/53  109/65 119/64  Pulse: 65  72 83  Resp: 16  18   Temp: 100.2 F (37.9 C)  99.4 F (37.4 C)  TempSrc: Oral  Oral   SpO2: 93%  96% 98%  Weight:  89.3 kg (196 lb 13.9 oz)    Height:       Filed Weights   12/23/15 1852 12/24/15 0500 12/25/15 0500  Weight: 86.7 kg (191 lb 3.2 oz) 87.1 kg (192 lb 1.6 oz) 89.3 kg (196 lb 13.9 oz)   Body mass index is 27.46 kg/m.   General appearance :Awake, alert, not in any distress. Speech Clear. Not toxic Looking Eyes:, pupils equally reactive to light and accomodation,no scleral icterus.Pink conjunctiva HEENT:  Atraumatic and Normocephalic Neck: supple, no JVD. No cervical lymphadenopathy. No thyromegaly Resp:Good air entry bilaterally, no added sounds  CVS: S1 S2 regular, no murmurs.  GI: Bowel sounds present, Non tender and not distended with no gaurding, rigidity or rebound.No organomegaly Extremities: Left foot with decreased edema-erythema in the medial mid foot has decreased substantially. Continues to be tender. Left foot is not tense, good capillary refill. Right lower extremity is within normal limits. Neurology:  speech clear,Non focal, sensation is grossly intact. Psychiatric: Normal judgment and insight. Alert and oriented x 3. Normal mood. Musculoskeletal:No digital cyanosis Skin:No Rash, warm and dry Wounds:N/A  I have personally reviewed following labs and imaging studies  LABORATORY DATA: CBC:  Recent Labs Lab 12/23/15 1201 12/24/15 0501  WBC 11.4* 10.3  NEUTROABS 10.0*  --   HGB 10.8* 10.1*  HCT 32.2* 30.3*  MCV 95.3 95.6  PLT 149* 146*    Basic Metabolic Panel:  Recent Labs Lab 12/23/15 1201 12/24/15 0501 12/25/15 0611  NA 139 139 137  K 4.0 4.3 4.1  CL 99* 101 100*  CO2 27 30 25   GLUCOSE 290* 99 145*  BUN 38* 32* 42*  CREATININE 1.97* 1.81* 2.17*  CALCIUM 8.8* 8.6* 8.3*    GFR: Estimated Creatinine Clearance: 30.8 mL/min (by C-G formula based on SCr of 2.17 mg/dL (H)).  Liver Function Tests: No results for input(s): AST, ALT, ALKPHOS, BILITOT, PROT, ALBUMIN in the last 168 hours. No results for input(s): LIPASE, AMYLASE in the last 168 hours. No results for input(s): AMMONIA in the last 168 hours.  Coagulation Profile:  Recent Labs Lab 12/23/15 1845 12/24/15 0501 12/25/15 0611  INR 2.10 2.11 1.77    Cardiac Enzymes: No results for input(s): CKTOTAL, CKMB, CKMBINDEX, TROPONINI in the last 168 hours.  BNP (last 3 results) No results for input(s): PROBNP in the last 8760 hours.  HbA1C: No results for input(s): HGBA1C in the last 72  hours.  CBG:  Recent Labs Lab 12/24/15 1225 12/24/15 1734 12/24/15 2151 12/25/15 0754 12/25/15 1150  GLUCAP 173* 156* 172* 150* 252*    Lipid Profile: No results for input(s): CHOL, HDL, LDLCALC, TRIG, CHOLHDL, LDLDIRECT in the last 72 hours.  Thyroid Function Tests: No results for input(s): TSH, T4TOTAL, FREET4, T3FREE, THYROIDAB in the last 72 hours.  Anemia Panel: No results for input(s): VITAMINB12, FOLATE, FERRITIN, TIBC, IRON, RETICCTPCT in the last 72 hours.  Urine analysis:    Component Value Date/Time   COLORURINE YELLOW 03/05/2014 1627   APPEARANCEUR CLEAR 03/05/2014 1627   LABSPEC 1.020 03/05/2014 1627   PHURINE 5.0 03/05/2014 1627   GLUCOSEU NEG 03/05/2014 1627   GLUCOSEU 500 06/04/2010 1626   HGBUR NEG 03/05/2014 1627   BILIRUBINUR NEG 03/05/2014 1627   KETONESUR NEG 03/05/2014 1627   PROTEINUR NEG 03/05/2014 1627   UROBILINOGEN 0.2 03/05/2014 1627   NITRITE NEG 03/05/2014 1627   LEUKOCYTESUR NEG 03/05/2014 1627    Sepsis Labs:  Lactic Acid, Venous No results found for: LATICACIDVEN  MICROBIOLOGY: Recent Results (from the past 240 hour(s))  Culture, blood (routine x 2)     Status: None (Preliminary result)   Collection Time: 12/23/15  5:00 PM  Result Value Ref Range Status   Specimen Description BLOOD RIGHT FOREARM  Final   Special Requests BOTTLES DRAWN AEROBIC AND ANAEROBIC 5CC  Final   Culture NO GROWTH 2 DAYS  Final   Report Status PENDING  Incomplete  Culture, blood (routine x 2)     Status: None (Preliminary result)   Collection Time: 12/23/15  6:56 PM  Result Value Ref Range Status   Specimen Description BLOOD RIGHT ANTECUBITAL  Final   Special Requests BOTTLES DRAWN AEROBIC AND ANAEROBIC 5CC  Final   Culture NO GROWTH 2 DAYS  Final   Report Status PENDING  Incomplete    RADIOLOGY STUDIES/RESULTS: Dg Foot Complete Left  Result Date: 12/23/2015 CLINICAL DATA:  75 year old male with left foot pain and swelling for 4 days. No known  injury. Initial encounter. EXAM: LEFT FOOT - COMPLETE 3+ VIEW COMPARISON:  None. FINDINGS: Calcified peripheral vascular disease.   No subcutaneous gas. Calcaneus appears intact with minimal degenerative spurring. Small chronic ossific fragment adjacent to degenerative spurring at the neck of the talus. Other tarsal bones appear intact. First MTP osteoarthritis with mild to moderate joint space loss and subchondral sclerosis. Healed chronic fracture of the fifth proximal phalanx suspected. No acute osseous abnormality identified. IMPRESSION: 1.  No acute osseous abnormality identified about the left foot. 2. Calcified peripheral vascular disease. Electronically Signed   By: Genevie Ann M.D.   On: 12/23/2015 12:17     LOS: 2 days   Anderson Middlebrooks A, MD  [facility] Pager:[phone]  If 7PM-7AM, please contact night-coverage [email or web address] Password [email or web address] 12/25/2015, 1:10 PM

## 2015-12-25 NOTE — Patient Outreach (Addendum)
[facility]) Care Management  12/25/2015  [name] [birth year] [id number]   Referral Date:  12/24/15 Source:  [name]  Issue:  H/o active with [place] prior to admission.  Written consent obtained.  Currently  at [facility].   Admissions: 1 Multiple co-morbidities.   Patient remains in-patient.  RN CM will continue to monitor for discharge notification for Transition of Care Services.   [name], BSN, RN, [place] Care Management Care Management Coordinator [phone] Direct [phone] Cell [phone] Office [phone] Fax [name]

## 2015-12-26 ENCOUNTER — Other Ambulatory Visit: Payer: Self-pay

## 2015-12-26 ENCOUNTER — Ambulatory Visit: Payer: Self-pay | Admitting: *Deleted

## 2015-12-26 DIAGNOSIS — I10 Essential (primary) hypertension: Secondary | ICD-10-CM

## 2015-12-26 DIAGNOSIS — E782 Mixed hyperlipidemia: Secondary | ICD-10-CM

## 2015-12-26 LAB — GLUCOSE, CAPILLARY
GLUCOSE-CAPILLARY: 160 mg/dL — AB (ref 65–99)
GLUCOSE-CAPILLARY: 249 mg/dL — AB (ref 65–99)
GLUCOSE-CAPILLARY: 163 mg/dL — AB (ref 65–99)
GLUCOSE-CAPILLARY: 292 mg/dL — AB (ref 65–99)

## 2015-12-26 LAB — PROTIME-INR
INR: 2.56
Prothrombin Time: 28 seconds — ABNORMAL HIGH (ref 11.4–15.2)

## 2015-12-26 LAB — BASIC METABOLIC PANEL
ANION GAP: 7 (ref 5–15)
BUN: 44 mg/dL — AB (ref 6–20)
CHLORIDE: 102 mmol/L (ref 101–111)
CO2: 29 mmol/L (ref 22–32)
Calcium: 8 mg/dL — ABNORMAL LOW (ref 8.9–10.3)
Creatinine, Ser: 2 mg/dL — ABNORMAL HIGH (ref 0.61–1.24)
GFR calc Af Amer: 36 mL/min — ABNORMAL LOW (ref >60)
GFR calc non Af Amer: 31 mL/min — ABNORMAL LOW (ref >60)
GLUCOSE: 147 mg/dL — AB (ref 65–99)
POTASSIUM: 3.8 mmol/L (ref 3.5–5.1)
Sodium: 138 mmol/L (ref 135–145)

## 2015-12-26 MED ORDER — COLCHICINE 0.6 MG PO TABS
0.6000 mg | ORAL_TABLET | Freq: Every day | ORAL | Status: DC
  Administered 2015-12-26: 0.6 mg via ORAL
  Filled 2015-12-26 (×3): qty 1

## 2015-12-26 MED ORDER — WARFARIN SODIUM 7.5 MG PO TABS
7.5000 mg | ORAL_TABLET | Freq: Once | ORAL | Status: AC
  Administered 2015-12-26: 7.5 mg via ORAL
  Filled 2015-12-26: qty 1

## 2015-12-26 MED ORDER — METHYLPREDNISOLONE SODIUM SUCC 125 MG IJ SOLR
60.0000 mg | Freq: Three times a day (TID) | INTRAMUSCULAR | Status: DC
  Administered 2015-12-26 – 2015-12-27 (×4): 60 mg via INTRAVENOUS
  Filled 2015-12-26 (×4): qty 2

## 2015-12-26 NOTE — Progress Notes (Signed)
PROGRESS NOTE        PATIENT DETAILS Name: [name] Age: 75 y.o. Sex: male Date of Birth: [birth year] Admit Date: 12/23/2015 Admitting Physician Evalee Mutton Kristeen Mans, MD EXB:[id number] DAVID, MD  Brief Narrative: Patient is a 75 y.o. 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 Found to have cellulitis and started on intravenous vancomycin. Clinically improving, see below for further details  Subjective: Patient feels okay, denies any complaints.  Assessment/Plan:  Cellulitis of the left foot:  Improved, though still tender-has significantly decreased edema and erythema.  Continue to elevate left leg, continue intravenous vancomycin. Blood cultures negative so far. Uric acid is elevated, patient still have some pain I don't know gouty arthritis complicating this. I will start him on steroids and colchicine.  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 Follow electrolytes, if any further worsening in renal function may need to hold losartan.  Chronic diastolic congestive heart failure:  Compensated, on Demadex, continued.  CKD (chronic kidney disease) stage 3, GFR 30-59 ml/min:  creatinine close to usual baseline, follow-up closely, check BMP in a.m. 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  DVT Prophylaxis: Full dose anticoagulation with Coumadin, subtherapeutic INR of 1.7.  Code Status: Full code  Family Communication: None at bedside  Disposition Plan: Remain inpatient-home in next few days  Antimicrobial  agents: See below  Procedures: None  CONSULTS:  None  Time spent: 25 minutes-Greater than 50% of this time was spent in counseling, explanation of diagnosis, planning of further management, and coordination of care.  MEDICATIONS: Anti-infectives    Start     Dose/Rate Route Frequency Ordered Stop   12/23/15 1700  vancomycin (VANCOCIN) IVPB 1000 mg/200 mL premix     1,000 mg 200 mL/hr over 60 Minutes Intravenous Every 24 hours 12/23/15 1647        Scheduled Meds: . atorvastatin  40 mg Oral q1800  . carvedilol  6.25 mg Oral BID WC  . cholecalciferol  5,000 Units Oral QHS  . insulin aspart  0-9 Units Subcutaneous TID WC  . irbesartan  150 mg Oral Daily  . loratadine  10 mg Oral Daily  . multivitamin  1 tablet Oral BID  . multivitamin with minerals  1 tablet Oral QHS  . potassium chloride SA  20 mEq Oral BID  . sertraline  100 mg Oral Daily  . sodium chloride flush  3 mL Intravenous Q12H  . torsemide  100 mg Oral Daily  . vancomycin  1,000 mg Intravenous Q24H  . warfarin  7.5 mg Oral ONCE-1800  . Warfarin - Pharmacist Dosing Inpatient   Does not apply q1800   Continuous Infusions: PRN Meds:.acetaminophen **OR** acetaminophen, acetaminophen, albuterol, ALPRAZolam, fluticasone, HYDROcodone-acetaminophen, morphine injection, ondansetron **OR** ondansetron (ZOFRAN) IV, polyvinyl alcohol, sodium chloride   PHYSICAL EXAM: Vital signs: Vitals:   12/25/15 1426 12/25/15 2210 12/26/15 0433 12/26/15 0500  BP: (!) 97/51 140/79 130/65   Pulse: 86 76 68   Resp: 18 20 18    Temp:  98.6 F (37 C) 98 F (36.7 C)   TempSrc:  Oral Oral   SpO2: 95% 100% 97%   Weight:    87.8 kg (193 lb 8 oz)  Height:       Filed Weights   12/24/15 0500 12/25/15 0500 12/26/15 0500  Weight: 87.1 kg (192 lb 1.6 oz) 89.3 kg (196 lb 13.9 oz) 87.8 kg (193 lb 8 oz)   Body mass index is 26.99 kg/m.   General appearance :Awake, alert, not in any distress. Speech Clear. Not toxic Looking Eyes:, pupils  equally reactive to light and accomodation,no scleral icterus.Pink conjunctiva HEENT: Atraumatic and Normocephalic Neck: supple, no JVD. No cervical lymphadenopathy. No thyromegaly Resp:Good air entry bilaterally, no added sounds  CVS: S1 S2 regular, no murmurs.  GI: Bowel sounds present, Non tender and not distended with no gaurding, rigidity or rebound.No organomegaly Extremities: Left foot with decreased edema-erythema in the medial mid foot has decreased substantially. Continues to be tender. Left foot is not tense, good capillary refill. Right lower extremity is within normal limits. Neurology:  speech clear,Non focal, sensation is grossly intact. Psychiatric: Normal judgment and insight. Alert and oriented x 3. Normal mood. Musculoskeletal:No digital cyanosis Skin:No Rash, warm and dry Wounds:N/A  I have personally reviewed following labs and imaging studies  LABORATORY DATA: CBC:  Recent Labs Lab 12/23/15 1201 12/24/15 0501  WBC 11.4* 10.3  NEUTROABS 10.0*  --   HGB 10.8* 10.1*  HCT 32.2* 30.3*  MCV 95.3 95.6  PLT 149* 146*    Basic Metabolic Panel:  Recent Labs Lab 12/23/15 1201 12/24/15 0501 12/25/15 0611 12/26/15 0537  NA 139 139 137 138  K 4.0 4.3 4.1 3.8  CL 99* 101 100* 102  CO2 27 30 25 29   GLUCOSE 290* 99 145* 147*  BUN 38* 32* 42* 44*  CREATININE 1.97* 1.81* 2.17* 2.00*  CALCIUM 8.8* 8.6* 8.3* 8.0*    GFR: Estimated Creatinine Clearance: 33.5 mL/min (by C-G formula based on SCr of 2 mg/dL (H)).  Liver Function Tests: No results for input(s): AST, ALT, ALKPHOS, BILITOT, PROT, ALBUMIN in the last 168 hours. No results for input(s): LIPASE, AMYLASE in the last 168 hours. No results for input(s): AMMONIA in the last 168 hours.  Coagulation Profile:  Recent Labs Lab 12/23/15 1845 12/24/15 0501 12/25/15 0611 12/26/15 0537  INR 2.10 2.11 1.77 2.56    Cardiac Enzymes: No results for input(s): CKTOTAL, CKMB, CKMBINDEX, TROPONINI in the last  168 hours.  BNP (last 3 results) No results for input(s): PROBNP in the last 8760 hours.  HbA1C: No results for input(s): HGBA1C in the last 72 hours.  CBG:  Recent Labs Lab 12/25/15 1150 12/25/15 1749 12/25/15 2207 12/26/15 0717 12/26/15 1133  GLUCAP 252* 169* 173* 160* 249*    Lipid Profile: No results for input(s): CHOL, HDL, LDLCALC, TRIG, CHOLHDL, LDLDIRECT in the last 72 hours.  Thyroid Function Tests: No results for input(s): TSH, T4TOTAL, FREET4, T3FREE, THYROIDAB in the last 72 hours.  Anemia Panel: No results for input(s): VITAMINB12, FOLATE, FERRITIN, TIBC, IRON, RETICCTPCT in the last 72 hours.  Urine analysis:    Component Value Date/Time   COLORURINE YELLOW 03/05/2014 1627   APPEARANCEUR CLEAR 03/05/2014 1627   LABSPEC 1.020 03/05/2014 1627   PHURINE 5.0 03/05/2014 1627   GLUCOSEU NEG 03/05/2014 1627   GLUCOSEU 500 06/04/2010 1626   HGBUR NEG 03/05/2014 1627   BILIRUBINUR NEG 03/05/2014 1627   KETONESUR NEG 03/05/2014 1627   PROTEINUR NEG 03/05/2014 1627  UROBILINOGEN 0.2 03/05/2014 1627   NITRITE NEG 03/05/2014 1627   LEUKOCYTESUR NEG 03/05/2014 1627    Sepsis Labs: Lactic Acid, Venous No results found for: LATICACIDVEN  MICROBIOLOGY: Recent Results (from the past 240 hour(s))  Culture, blood (routine x 2)     Status: None (Preliminary result)   Collection Time: 12/23/15  5:00 PM  Result Value Ref Range Status   Specimen Description BLOOD RIGHT FOREARM  Final   Special Requests BOTTLES DRAWN AEROBIC AND ANAEROBIC 5CC  Final   Culture NO GROWTH 2 DAYS  Final   Report Status PENDING  Incomplete  Culture, blood (routine x 2)     Status: None (Preliminary result)   Collection Time: 12/23/15  6:56 PM  Result Value Ref Range Status   Specimen Description BLOOD RIGHT ANTECUBITAL  Final   Special Requests BOTTLES DRAWN AEROBIC AND ANAEROBIC 5CC  Final   Culture NO GROWTH 2 DAYS  Final   Report Status PENDING  Incomplete    RADIOLOGY  STUDIES/RESULTS: Dg Foot Complete Left  Result Date: 12/23/2015 CLINICAL DATA:  75 year old male with left foot pain and swelling for 4 days. No known injury. Initial encounter. EXAM: LEFT FOOT - COMPLETE 3+ VIEW COMPARISON:  None. FINDINGS: Calcified peripheral vascular disease.   No subcutaneous gas. Calcaneus appears intact with minimal degenerative spurring. Small chronic ossific fragment adjacent to degenerative spurring at the neck of the talus. Other tarsal bones appear intact. First MTP osteoarthritis with mild to moderate joint space loss and subchondral sclerosis. Healed chronic fracture of the fifth proximal phalanx suspected. No acute osseous abnormality identified. IMPRESSION: 1.  No acute osseous abnormality identified about the left foot. 2. Calcified peripheral vascular disease. Electronically Signed   By: Genevie Ann M.D.   On: 12/23/2015 12:17     LOS: 3 days   Dakota Vanwart A, MD  [facility] Pager:[phone]  If 7PM-7AM, please contact night-coverage [email or web address] Password [email or web address] 12/26/2015, 2:03 PM

## 2015-12-26 NOTE — Progress Notes (Signed)
Pharmacy Antibiotic Note  [name] is a 75 y.o. male admitted on 12/23/2015 with cellulitis.  Pharmacy has been consulted for vancomycin dosing - day #4. Afebrile, WBC wnl. SCr back down to 2 (~baseline per MD note), CrCl~33. Cultures negative to date.  Plan: Vancomycin 1g IV every 24 hours.  Goal trough 10-15 mcg/mL.  Monitor culture data, renal function and clinical course Vancomycin trough as indicated  Height: 5\' 11"  (180.3 cm) Weight: 193 lb 8 oz (87.8 kg) IBW/kg (Calculated) : 75.3  Temp (24hrs), Avg:98.3 F (36.8 C), Min:98 F (36.7 C), Max:98.6 F (37 C)   Recent Labs Lab 12/23/15 1201 12/24/15 0501 12/25/15 0611 12/26/15 0537  WBC 11.4* 10.3  --   --   CREATININE 1.97* 1.81* 2.17* 2.00*    Estimated Creatinine Clearance: 33.5 mL/min (by C-G formula based on SCr of 2 mg/dL (H)).    Allergies  Allergen Reactions  . Sunflower Seed [Sunflower Oil] Swelling and Other (See Comments)    Tongue and lip swelling  . Horse-Derived Products Other (See Comments)    Per allergy skin test  . Tetanus Toxoids Other (See Comments)    Per allergy skin test    Antimicrobials this admission: Vanc 12/18 >>   Dose adjustments this admission: n/a  Microbiology results:  BCx: NGTD  Elicia Lamp, PharmD, BCPS Clinical Pharmacist 12/26/2015 8:37 AM

## 2015-12-26 NOTE — Progress Notes (Signed)
[place] for Coumadin Indication: atrial fibrillation  Allergies  Allergen Reactions  . Sunflower Seed [Sunflower Oil] Swelling and Other (See Comments)    Tongue and lip swelling  . Horse-Derived Products Other (See Comments)    Per allergy skin test  . Tetanus Toxoids Other (See Comments)    Per allergy skin test    Patient Measurements: Height: 5\' 11"  (180.3 cm) Weight: 193 lb 8 oz (87.8 kg) IBW/kg (Calculated) : 75.3  Vital Signs: Temp: 98 F (36.7 C) (12/21 0433) Temp Source: Oral (12/21 0433) BP: 130/65 (12/21 0433) Pulse Rate: 68 (12/21 0433)  Labs:  Recent Labs  12/23/15 1201  12/24/15 0501 12/25/15 0611 12/26/15 0537  HGB 10.8*  --  10.1*  --   --   HCT 32.2*  --  30.3*  --   --   PLT 149*  --  146*  --   --   LABPROT  --   < > 24.0* 20.9* 28.0*  INR  --   < > 2.11 1.77 2.56  CREATININE 1.97*  --  1.81* 2.17* 2.00*  < > = values in this interval not displayed.  Estimated Creatinine Clearance: 33.5 mL/min (by C-G formula based on SCr of 2 mg/dL (H)).    Assessment: 75 yo M admitted  with cellulitis and started on IV antibiotics. Pt on Coumadin PTA for hx afib.      PTA Mr. Meddings took his Coumadin as 5mg  BID (as opposed to 10mg  daily).  He missed his morning dose 12/18. INR therapeutic on admission.  INR fluctuating, now back up to 2.56. CBC stable, no bleeding noted.  Goal of Therapy:  INR 2-3 Monitor platelets by anticoagulation protocol: Yes   Plan:  Coumadin 7.5mg  PO x 1 tonight Daily INR Monitor CBC, s/sx bleeding   Elicia Lamp, PharmD, BCPS Clinical Pharmacist 12/26/2015 8:33 AM

## 2015-12-26 NOTE — Procedures (Signed)
Patient refused CPAP for tonight.  Patient is aware to ask RN to call Respiratory if he changes his mind.

## 2015-12-26 NOTE — Patient Outreach (Signed)
[facility]) Care Management  12/26/2015  KASHMERE STAFFA 1940/04/17 [id number]   Referral Date:  12/24/15 Source:  [name]  Issue:  H/o active with [facility] prior to admission.  Written consent obtained.  Currently  at [facility].   Admissions: 1 Multiple co-morbidities.   Patient remains in-patient.  RN CM will continue to monitor for discharge notification for Transition of Care Services.   [name], BSN, RN, [facility] Care Management Care Management Coordinator [phone] Direct [phone] Cell [phone] Office [phone] Fax [email or web address]

## 2015-12-27 ENCOUNTER — Ambulatory Visit: Payer: Self-pay

## 2015-12-27 DIAGNOSIS — Z951 Presence of aortocoronary bypass graft: Secondary | ICD-10-CM

## 2015-12-27 LAB — PROTIME-INR
INR: 2.84
Prothrombin Time: 30.5 seconds — ABNORMAL HIGH (ref 11.4–15.2)

## 2015-12-27 LAB — GLUCOSE, CAPILLARY
GLUCOSE-CAPILLARY: 314 mg/dL — AB (ref 65–99)
GLUCOSE-CAPILLARY: 360 mg/dL — AB (ref 65–99)

## 2015-12-27 LAB — BASIC METABOLIC PANEL
ANION GAP: 10 (ref 5–15)
BUN: 48 mg/dL — ABNORMAL HIGH (ref 6–20)
CALCIUM: 7.9 mg/dL — AB (ref 8.9–10.3)
CO2: 27 mmol/L (ref 22–32)
Chloride: 103 mmol/L (ref 101–111)
Creatinine, Ser: 2.2 mg/dL — ABNORMAL HIGH (ref 0.61–1.24)
GFR, EST AFRICAN AMERICAN: 32 mL/min — AB (ref >60)
GFR, EST NON AFRICAN AMERICAN: 27 mL/min — AB (ref >60)
Glucose, Bld: 327 mg/dL — ABNORMAL HIGH (ref 65–99)
Potassium: 4.1 mmol/L (ref 3.5–5.1)
Sodium: 140 mmol/L (ref 135–145)

## 2015-12-27 MED ORDER — DOXYCYCLINE HYCLATE 100 MG PO TABS: 100.0000 mg | ORAL_TABLET | Freq: Two times a day (BID) | ORAL | 0 refills | Status: DC

## 2015-12-27 MED ORDER — COLCHICINE 0.6 MG PO TABS: 0.6000 mg | ORAL_TABLET | Freq: Every day | ORAL | 0 refills | Status: DC

## 2015-12-27 MED ORDER — CEPHALEXIN 500 MG PO CAPS: 500.0000 mg | ORAL_CAPSULE | Freq: Three times a day (TID) | ORAL | 0 refills | Status: DC

## 2015-12-27 MED ORDER — PREDNISONE 10 MG (21) PO TBPK: ORAL_TABLET | ORAL | 0 refills | Status: DC

## 2015-12-27 NOTE — Discharge Summary (Signed)
Physician Discharge Summary  [name] OIB:[id number] DOB: [birth year] DOA: 12/23/2015  PCP: Alesia Richards, MD  Admit date: 12/23/2015 Discharge date: 12/27/2015  Admitted From: Home Disposition: Home  Recommendations for Outpatient Follow-up:  1. Follow up with PCP in 1-2 weeks 2. Please obtain BMP/CBC in one week. 3. Doxycycline and Keflex for 5 more days, prednisone taper. 4. Colchicine for 2 weeks at 0.6, please consider lower dose of 0.3 for prophylaxis.  Home Health: NA Equipment/Devices:NA  Discharge Condition: Stable CODE STATUS: Full Code Diet recommendation: Diet heart healthy/carb modified Room service appropriate? Yes; Fluid consistency: Thin; Fluid restriction: 1500 mL Fluid Diet - low sodium heart healthy  Brief/Interim Summary: Patient is a 75 y.o. 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 Found to have cellulitis and started on intravenous vancomycin. 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 -Treated with vancomycin and leg elevation, blood cultures negative. -Uric acid is elevated at 9.0, no previous diagnosis of gout although he had elevated uric acid in May 2017. -I gave him colchicine and 1 dose of IV  Solu-Medrol pain improved immediately. -Discharged home on doxycycline and Keflex for 4 more days, also on prednisone taper and colchicine.  ? Acute gouty arthritis -Cannot rule out acute gouty arthritis, no previous diagnosis of gout although he had elevated uric acid previously. -Discharged on prednisone taper and colchicine. Consider to continue colchicine at lower dose at 0.3 as outpatient.  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 Follow electrolytes, if any further worsening in renal function may need to hold losartan.  Chronic diastolic congestive heart failure: Compensated, on Demadex, continued.  CKD (chronic kidney disease) stage 3, GFR 30-59 ml/min: creatinine close to usual baseline, follow-up closely, check BMP in a.m. Creatinine 2.1 today  GERD:Continue PPI  Atypical atrial flutter s/p RFA 10/26/13: Rate controlled with Coreg, pharmacy to dose Coumadin while inpatient.INR therapeutic.  Discharge Instructions  Discharge Instructions    AMB Referral to [facility]    Complete by:  As directed    Please assign to Telephonic RNCM. He has been followed by [place] prior to admit. Written consent obtained. Currently at [facility]. Has had x1 hospitalization with multiple co-morbidities. [name], [place], [facility] [phone]   Reason for consult:  Please assign to Telephonic [facility]   Expected date of contact:  1-3 days (reserved for hospital discharges)   Diet - low sodium heart healthy    Complete by:  As directed    Increase activity slowly    Complete by:  As directed      Allergies as of 12/27/2015  Reactions   Sunflower Seed [sunflower Oil] Swelling, Other (See Comments)   Tongue and lip swelling   Horse-derived Products Other (See Comments)   Per allergy skin test   Tetanus Toxoids  Other (See Comments)   Per allergy skin test      Medication List    TAKE these medications   ACCU-CHEK AVIVA PLUS w/Device Kit Check blood sugar 1 time  daily   acetaminophen 500 MG tablet Commonly known as:  TYLENOL Take 500-1,000 mg by mouth every 6 (six) hours as needed (pain).   ALPRAZolam 1 MG tablet Commonly known as:  XANAX Take 0.5 tablets (0.5 mg total) by mouth at bedtime as needed for sleep. What changed:  how much to take   ARTIFICIAL TEARS 1.4 % ophthalmic solution Generic drug:  polyvinyl alcohol Place 1 drop into both eyes daily as needed for dry eyes.   atorvastatin 40 MG tablet Commonly known as:  LIPITOR TAKE 1 TABLET BY MOUTH EVERY DAY What changed:  See the new instructions.   azelastine 0.1 % nasal spray Commonly known as:  ASTELIN Place 2 sprays into both nostrils 2 (two) times daily. Use in each nostril as directed What changed:  when to take this  reasons to take this  additional instructions   BAYER MICROLET LANCETS lancets Test once a day   carvedilol 6.25 MG tablet Commonly known as:  COREG TAKE 1 TABLET (6.25 MG TOTAL) BY MOUTH 2 (TWO) TIMES DAILY.   cephALEXin 500 MG capsule Commonly known as:  KEFLEX Take 1 capsule (500 mg total) by mouth 3 (three) times daily.   CLARITIN 10 MG tablet Generic drug:  loratadine Take 10 mg by mouth daily.   colchicine 0.6 MG tablet Take 1 tablet (0.6 mg total) by mouth daily.   diphenhydrAMINE 25 MG tablet Commonly known as:  BENADRYL Take 25 mg by mouth at bedtime as needed for allergies.   doxycycline 100 MG tablet Commonly known as:  VIBRA-TABS Take 1 tablet (100 mg total) by mouth 2 (two) times daily.   fluticasone 50 MCG/ACT nasal spray Commonly known as:  FLONASE Place 1 spray into both nostrils daily as needed for allergies.   GAVISCON PO Take 1 tablet by mouth daily as needed (acid reflux).   glimepiride 4 MG tablet Commonly known as:  AMARYL Take 1 tablet 3 times  daily. What changed:  how much to take  how to take this  when to take this  additional instructions   glucose blood test strip Commonly known as:  ACCU-CHEK AVIVA PLUS Check blood glucose 3 xdaily.   DX-E11.29 - fluctuating glucoses - uncontrolled Diabetes   HYDROcodone-acetaminophen 5-325 MG tablet Commonly known as:  NORCO/VICODIN Take 1-2 tablets by mouth every 6 (six) hours as needed for moderate pain.   lidocaine 5 % Commonly known as:  LIDODERM Place 1 patch onto the skin daily as needed (pain). Remove & Discard patch within 12 hours or as directed by MD   metFORMIN 500 MG 24 hr tablet Commonly known as:  GLUCOPHAGE-XR TAKE 2 TABLETS BY MOUTH TWO TIMES DAILY   metolazone 5 MG tablet Commonly known as:  ZAROXOLYN Take 5 mg by mouth daily as needed (weight gain and edema). What changed:  how much to take  how to take this  when to take this  reasons to take this  additional instructions   multivitamin with minerals Tabs tablet Take 1 tablet by mouth at bedtime.   potassium chloride SA 20 MEQ  tablet Commonly known as:  K-DUR,KLOR-CON Take 1 tablet (20 mEq total) by mouth 2 (two) times daily. What changed:  when to take this  additional instructions   predniSONE 10 MG (21) Tbpk tablet Commonly known as:  STERAPRED UNI-PAK 21 TAB Take 6-5-4-3-2-1 PO orally till gone   PRESCRIPTION MEDICATION Inhale into the lungs at bedtime. CPAP   PRESERVISION AREDS 2 Caps Take 1 capsule by mouth 2 (two) times daily.   ranitidine 150 MG tablet Commonly known as:  ZANTAC Take 150 mg by mouth daily as needed for heartburn.   sertraline 100 MG tablet Commonly known as:  ZOLOFT TAKE 1 TABLET BY MOUTH DAILY   torsemide 100 MG tablet Commonly known as:  DEMADEX Take 100 mg in AM and 50 mg in PM if needed for weight gain What changed:  how much to take  how to take this  when to take this  additional instructions   TRULICITY 3.54 SF/6.8LE Sopn Generic  drug:  Dulaglutide INJECT 1 PEN INTO THE SKIN ONCE A WEEK. What changed:  how much to take  when to take this   valsartan 320 MG tablet Commonly known as:  DIOVAN Take 160 mg by mouth daily.   VITAMIN B COMPLEX PO Take 1 tablet by mouth at bedtime.   Vitamin D-3 5000 units Tabs Take 5,000 Units by mouth at bedtime.   warfarin 5 MG tablet Commonly known as:  COUMADIN TAKE 1 TABLET BY MOUTH TWICE A DAY            Durable Medical Equipment        Start     Ordered   12/24/15 1308  For home use only DME Walker rolling  Once    Question:  Patient needs a walker to treat with the following condition  Answer:  Foot infection   12/24/15 1308     [facility] Follow up.   Why:  Rolling walker to be delivered to room Contact information: [address] [address] [phone]          Allergies  Allergen Reactions  . Sunflower Seed [Sunflower Oil] Swelling and Other (See Comments)    Tongue and lip swelling  . Horse-Derived Products Other (See Comments)    Per allergy skin test  . Tetanus Toxoids Other (See Comments)    Per allergy skin test    Consultations:   None  Procedures (Echo, Carotid, EGD, Colonoscopy, ERCP)   Radiological studies: Dg Foot Complete Left  Result Date: 12/23/2015 CLINICAL DATA:  75 year old male with left foot pain and swelling for 4 days. No known injury. Initial encounter. EXAM: LEFT FOOT - COMPLETE 3+ VIEW COMPARISON:  None. FINDINGS: Calcified peripheral vascular disease.   No subcutaneous gas. Calcaneus appears intact with minimal degenerative spurring. Small chronic ossific fragment adjacent to degenerative spurring at the neck of the talus. Other tarsal bones appear intact. First MTP osteoarthritis with mild to moderate joint space loss and subchondral sclerosis. Healed chronic fracture of the fifth proximal phalanx suspected. No acute osseous abnormality identified.  IMPRESSION: 1.  No acute osseous abnormality identified about the left foot. 2. Calcified peripheral vascular disease. Electronically Signed   By: Genevie Ann M.D.   On: 12/23/2015 12:17     Subjective:  Discharge Exam: Vitals:   12/26/15 1429 12/26/15 2150 12/27/15 0527 12/27/15 0617  BP: 123/68 130/72  (!) 113/49  Pulse: 69 70  69  Resp: 18 18  18   Temp: 97.6 F (36.4 C) 98.6 F (37 C)  98.6 F (37 C)  TempSrc: Oral Oral  Oral  SpO2: 98% 99%  100%  Weight:   86.2 kg (190 lb 1.6 oz)   Height:       General: Pt is alert, awake, not in acute distress Cardiovascular: RRR, S1/S2 +, no rubs, no gallops Respiratory: CTA bilaterally, no wheezing, no rhonchi Abdominal: Soft, NT, ND, bowel sounds + Extremities: no edema, no cyanosis   The results of significant diagnostics from this hospitalization (including imaging, microbiology, ancillary and laboratory) are listed below for reference.    Microbiology: Recent Results (from the past 240 hour(s))  Culture, blood (routine x 2)     Status: None (Preliminary result)   Collection Time: 12/23/15  5:00 PM  Result Value Ref Range Status   Specimen Description BLOOD RIGHT FOREARM  Final   Special Requests BOTTLES DRAWN AEROBIC AND ANAEROBIC 5CC  Final   Culture NO GROWTH 3 DAYS  Final   Report Status PENDING  Incomplete  Culture, blood (routine x 2)     Status: None (Preliminary result)   Collection Time: 12/23/15  6:56 PM  Result Value Ref Range Status   Specimen Description BLOOD RIGHT ANTECUBITAL  Final   Special Requests BOTTLES DRAWN AEROBIC AND ANAEROBIC 5CC  Final   Culture NO GROWTH 3 DAYS  Final   Report Status PENDING  Incomplete     Labs: BNP (last 3 results) No results for input(s): BNP in the last 8760 hours. Basic Metabolic Panel:  Recent Labs Lab 12/23/15 1201 12/24/15 0501 12/25/15 0611 12/26/15 0537 12/27/15 0633  NA 139 139 137 138 140  K 4.0 4.3 4.1 3.8 4.1  CL 99* 101 100* 102 103  CO2 27 30 25 29 27    GLUCOSE 290* 99 145* 147* 327*  BUN 38* 32* 42* 44* 48*  CREATININE 1.97* 1.81* 2.17* 2.00* 2.20*  CALCIUM 8.8* 8.6* 8.3* 8.0* 7.9*   Liver Function Tests: No results for input(s): AST, ALT, ALKPHOS, BILITOT, PROT, ALBUMIN in the last 168 hours. No results for input(s): LIPASE, AMYLASE in the last 168 hours. No results for input(s): AMMONIA in the last 168 hours. CBC:  Recent Labs Lab 12/23/15 1201 12/24/15 0501  WBC 11.4* 10.3  NEUTROABS 10.0*  --   HGB 10.8* 10.1*  HCT 32.2* 30.3*  MCV 95.3 95.6  PLT 149* 146*   Cardiac Enzymes: No results for input(s): CKTOTAL, CKMB, CKMBINDEX, TROPONINI in the last 168 hours. BNP: Invalid input(s): POCBNP CBG:  Recent Labs Lab 12/26/15 0717 12/26/15 1133 12/26/15 1654 12/26/15 2147 12/27/15 0800  GLUCAP 160* 249* 163* 292* 314*   D-Dimer No results for input(s): DDIMER in the last 72 hours. Hgb A1c No results for input(s): HGBA1C in the last 72 hours. Lipid Profile No results for input(s): CHOL, HDL, LDLCALC, TRIG, CHOLHDL, LDLDIRECT in the last 72 hours. Thyroid function studies No results for input(s): TSH, T4TOTAL, T3FREE, THYROIDAB in the last 72 hours.  Invalid input(s): FREET3 Anemia work up No results for input(s): VITAMINB12, FOLATE, FERRITIN, TIBC, IRON, RETICCTPCT in the last 72 hours. Urinalysis    Component Value Date/Time   COLORURINE YELLOW 03/05/2014 1627   APPEARANCEUR CLEAR 03/05/2014 1627   LABSPEC 1.020 03/05/2014 1627   PHURINE 5.0 03/05/2014 1627   GLUCOSEU NEG 03/05/2014 1627   GLUCOSEU 500 06/04/2010 1626   HGBUR NEG 03/05/2014 1627   BILIRUBINUR NEG 03/05/2014 [place]  03/05/2014 1627   PROTEINUR NEG 03/05/2014 1627   UROBILINOGEN 0.2 03/05/2014 1627   NITRITE NEG 03/05/2014 1627   LEUKOCYTESUR NEG 03/05/2014 1627   Sepsis Labs Invalid input(s): PROCALCITONIN,  WBC,  LACTICIDVEN Microbiology Recent Results (from the past 240 hour(s))  Culture, blood (routine x 2)      Status: None (Preliminary result)   Collection Time: 12/23/15  5:00 PM  Result Value Ref Range Status   Specimen Description BLOOD RIGHT FOREARM  Final   Special Requests BOTTLES DRAWN AEROBIC AND ANAEROBIC 5CC  Final   Culture NO GROWTH 3 DAYS  Final   Report Status PENDING  Incomplete  Culture, blood (routine x 2)     Status: None (Preliminary result)   Collection Time: 12/23/15  6:56 PM  Result Value Ref Range Status   Specimen Description BLOOD RIGHT ANTECUBITAL  Final   Special Requests BOTTLES DRAWN AEROBIC AND ANAEROBIC 5CC  Final   Culture NO GROWTH 3 DAYS  Final   Report Status PENDING  Incomplete     Time coordinating discharge: Over 30 minutes  SIGNED:   Birdie Hopes, MD  [facility] 12/27/2015, 11:55 AM Pager   If 7PM-7AM, please contact night-coverage [email or web address] Password [email or web address]

## 2015-12-27 NOTE — Progress Notes (Signed)
PT Cancellation Note  Patient Details Name: KHALFANI WEIDEMAN MRN: [id number] DOB: 1940-11-26   Cancelled Treatment:    Reason Eval/Treat Not Completed: PT screened, no needs identified, will sign off. Nsg providing d/c instructions. Pt states he has been mobilizing with RW well. No acute needs. Will sign off.  Duncan Dull 12/27/2015, 2:45 PM

## 2015-12-27 NOTE — Care Management Important Message (Signed)
Important Message  Patient Details  Name: [name] MRN: [id number] Date of Birth: January 14, 1940   Medicare Important Message Given:  Yes    Ebany Bowermaster Abena 12/27/2015, 11:01 AM

## 2015-12-27 NOTE — Discharge Instructions (Signed)
Information on my medicine - Coumadin   (Warfarin)-- you were prescribed/taking Coumadin (Warfarin) prior to this hospital admission.   Why was Coumadin prescribed for you?- you were taking coumadin prior to this hospital admission.  Coumadin was prescribed for you because you have a blood clot or a medical condition that can cause an increased risk of forming blood clots. Blood clots can cause serious health problems by blocking the flow of blood to the heart, lung, or brain. Coumadin can prevent harmful blood clots from forming. As a reminder your indication for Coumadin is:   Stroke Prevention Because Of Atrial Fibrillation  What test will check on my response to Coumadin? While on Coumadin (warfarin) you will need to have an INR test regularly to ensure that your dose is keeping you in the desired range. The INR (international normalized ratio) number is calculated from the result of the laboratory test called prothrombin time (PT).  If an INR APPOINTMENT HAS NOT ALREADY BEEN MADE FOR YOU please schedule an appointment to have this lab work done by your health care provider within 7 days. Your INR goal is usually a number between:  2 to 3 or your provider may give you a more narrow range like 2-2.5.  Ask your health care provider during an office visit what your goal INR is.  What  do you need to  know  About  COUMADIN? Take Coumadin (warfarin) exactly as prescribed by your healthcare provider about the same time each day.  DO NOT stop taking without talking to the doctor who prescribed the medication.  Stopping without other blood clot prevention medication to take the place of Coumadin may increase your risk of developing a new clot or stroke.  Get refills before you run out.  What do you do if you miss a dose? If you miss a dose, take it as soon as you remember on the same day then continue your regularly scheduled regimen the next day.  Do not take two doses of Coumadin at the same  time.  Important Safety Information A possible side effect of Coumadin (Warfarin) is an increased risk of bleeding. You should call your healthcare provider right away if you experience any of the following: ? Bleeding from an injury or your nose that does not stop. ? Unusual colored urine (red or dark brown) or unusual colored stools (red or black). ? Unusual bruising for unknown reasons. ? A serious fall or if you hit your head (even if there is no bleeding).  Some foods or medicines interact with Coumadin (warfarin) and might alter your response to warfarin. To help avoid this: ? Eat a balanced diet, maintaining a consistent amount of Vitamin K. ? Notify your provider about major diet changes you plan to make. ? Avoid alcohol or limit your intake to 1 drink for women and 2 drinks for men per day. (1 drink is 5 oz. wine, 12 oz. beer, or 1.5 oz. liquor.)  Make sure that ANY health care provider who prescribes medication for you knows that you are taking Coumadin (warfarin).  Also make sure the healthcare provider who is monitoring your Coumadin knows when you have started a new medication including herbals and non-prescription products.  Coumadin (Warfarin)  Major Drug Interactions  Increased Warfarin Effect Decreased Warfarin Effect  Alcohol (large quantities) Antibiotics (esp. Septra/Bactrim, Flagyl, Cipro) Amiodarone (Cordarone) Aspirin (ASA) Cimetidine (Tagamet) Megestrol (Megace) NSAIDs (ibuprofen, naproxen, etc.) Piroxicam (Feldene) Propafenone (Rythmol SR) Propranolol (Inderal) Isoniazid (INH) Posaconazole (Noxafil) Barbiturates (  Phenobarbital) Carbamazepine (Tegretol) Chlordiazepoxide (Librium) Cholestyramine (Questran) Griseofulvin Oral Contraceptives Rifampin Sucralfate (Carafate) Vitamin K   Coumadin (Warfarin) Major Herbal Interactions  Increased Warfarin Effect Decreased Warfarin Effect  Garlic Ginseng Ginkgo biloba Coenzyme Q10 Green tea St. Johns wort     Coumadin (Warfarin) FOOD Interactions  Eat a consistent number of servings per week of foods HIGH in Vitamin K (1 serving =  cup)  Collards (cooked, or boiled & drained) Kale (cooked, or boiled & drained) Mustard greens (cooked, or boiled & drained) Parsley *serving size only =  cup Spinach (cooked, or boiled & drained) Swiss chard (cooked, or boiled & drained) Turnip greens (cooked, or boiled & drained)  Eat a consistent number of servings per week of foods MEDIUM-HIGH in Vitamin K (1 serving = 1 cup)  Asparagus (cooked, or boiled & drained) Broccoli (cooked, boiled & drained, or raw & chopped) Brussel sprouts (cooked, or boiled & drained) *serving size only =  cup Lettuce, raw (green leaf, endive, romaine) Spinach, raw Turnip greens, raw & chopped   These websites have more information on Coumadin (warfarin):  FailFactory.se; VeganReport.com.au;

## 2015-12-27 NOTE — Progress Notes (Signed)
Patient discharged home with belongings, ivs and tele removed. AVS given, tech back performed. Patient stable at time of discharge.

## 2015-12-28 LAB — CULTURE, BLOOD (ROUTINE X 2)
CULTURE: NO GROWTH
Culture: NO GROWTH

## 2015-12-31 ENCOUNTER — Other Ambulatory Visit: Payer: Self-pay

## 2015-12-31 NOTE — Patient Outreach (Signed)
[facility]) Care Management  12/31/2015  [name] 08/04/1940 [id number]  Transition of Care Services  Referral Date:  12/24/15 Source:  [name]  Issue:  H/o active with [facility] prior to admission.  Written consent obtained.   Admissions: 1  12/23/2015 - 12/27/2015  Cellulitis Left Foot Insurance: UHC/[employer] Medicare Complete  [address]  [place] [address] [phone] (M) [phone] (H)  Outreach call #1 to patient.  Patient completed call.   (Patient prefers to be contacted via cell # [phone]).  Best time to be reached is morning.  Providers: Primary MD: Dr. [name] -  CHF Clinic: Dr. Shaune Pascal Bensimhon -  Cardiologist: Dr. Cathie Olden: Pulmonologist: Juanito Doom, MD -  Opthamologist: Dr. Zigmund Daniel:   Social:  Patient lives in his home with his wife of 64 years.  Falls: None Caregiver: wife/Rita Kilker. Advanced Directives:yes DME: BP cuff, scales, Oxygen ([facility]), CPap ([employer]).   Admissions: 1  12/23/2015 - 12/27/2015  Cellulitis Left Foot States swelling has improved.  Continues to wrap and states feels better with this intervention.   Patient has not scheduled MD follow-up appt yet.    CHF  Patient under CHF Clinic: Dr. Clayborne Dana services.  Patient is avoiding salt, caffeine and reports improved compliance with self management interventions.  Patient has clear understanding of red/yellow/green zones.   Encounter Medications:  Outpatient Encounter Prescriptions as of 12/31/2015  Medication Sig Note  . acetaminophen (TYLENOL) 500 MG tablet Take 500-1,000 mg by mouth every 6 (six) hours as needed (pain).   [name] ALPRAZolam (XANAX) 1 MG tablet Take 0.5 tablets (0.5 mg total) by mouth at bedtime as needed for sleep. (Patient taking differently: Take 0.5-1 mg by mouth at bedtime as needed for sleep. ) 12/23/2015: Pt states that last night he took 2 tablets at 11pm and 1 tablet  at 3am but that is an unusual dose.   [name] Alum Hydroxide-Mag Carbonate (GAVISCON PO) Take 1 tablet by mouth daily as needed (acid reflux).   [name] atorvastatin (LIPITOR) 40 MG tablet TAKE 1 TABLET BY MOUTH EVERY DAY (Patient taking differently: TAKE 1/2 TABLET BY MOUTH ON MONDAY, WEDNESDAY, FRIDAY AT BEDTIME)   . azelastine (ASTELIN) 0.1 % nasal spray Place 2 sprays into both nostrils 2 (two) times daily. Use in each nostril as directed (Patient taking differently: Place 2 sprays into both nostrils 2 (two) times daily as needed for allergies. Use in each nostril as directed)   . B Complex Vitamins (VITAMIN B COMPLEX PO) Take 1 tablet by mouth at bedtime.    [name] BAYER MICROLET LANCETS lancets Test once a day   . Blood Glucose Monitoring Suppl (ACCU-CHEK AVIVA PLUS) w/Device KIT Check blood sugar 1 time  daily   . carvedilol (COREG) 6.25 MG tablet TAKE 1 TABLET (6.25 MG TOTAL) BY MOUTH 2 (TWO) TIMES DAILY.   . cephALEXin (KEFLEX) 500 MG capsule Take 1 capsule (500 mg total) by mouth 3 (three) times daily.   . Cholecalciferol (VITAMIN D-3) 5000 units TABS Take 5,000 Units by mouth at bedtime.   . colchicine 0.6 MG tablet Take 1 tablet (0.6 mg total) by mouth daily.   . diphenhydrAMINE (BENADRYL) 25 MG tablet Take 25 mg by mouth at bedtime as needed for allergies.    [name] doxycycline (VIBRA-TABS) 100 MG tablet Take 1 tablet (100 mg total) by mouth 2 (two) times daily.   . fluticasone (FLONASE) 50 MCG/ACT nasal spray Place 1 spray into  both nostrils daily as needed for allergies.    [name] glimepiride (AMARYL) 4 MG tablet Take 1 tablet 3 times daily. (Patient taking differently: Take 4 mg by mouth 3 (three) times daily with meals. Take 1 tablet 3 times daily)   . glucose blood (ACCU-CHEK AVIVA PLUS) test strip Check blood glucose 3 xdaily.   DX-E11.29 - fluctuating glucoses - uncontrolled Diabetes   . HYDROcodone-acetaminophen (NORCO/VICODIN) 5-325 MG tablet Take 1-2 tablets by mouth every 6 (six) hours as needed for  moderate pain.    [name] lidocaine (LIDODERM) 5 % Place 1 patch onto the skin daily as needed (pain). Remove & Discard patch within 12 hours or as directed by MD   . loratadine (CLARITIN) 10 MG tablet Take 10 mg by mouth daily.   . metFORMIN (GLUCOPHAGE-XR) 500 MG 24 hr tablet TAKE 2 TABLETS BY MOUTH TWO TIMES DAILY   . metolazone (ZAROXOLYN) 5 MG tablet Take 5 mg by mouth daily as needed (weight gain and edema). (Patient taking differently: Take 5 mg by mouth daily as needed (weight gain of >2 lbs/edema). Take 5 mg by mouth daily as needed (weight gain and edema))   . Multiple Vitamin (MULTIVITAMIN WITH MINERALS) TABS tablet Take 1 tablet by mouth at bedtime.    . Multiple Vitamins-Minerals (PRESERVISION AREDS 2) CAPS Take 1 capsule by mouth 2 (two) times daily.    . polyvinyl alcohol (ARTIFICIAL TEARS) 1.4 % ophthalmic solution Place 1 drop into both eyes daily as needed for dry eyes.   . potassium chloride SA (K-DUR,KLOR-CON) 20 MEQ tablet Take 1 tablet (20 mEq total) by mouth 2 (two) times daily. (Patient taking differently: Take 20 mEq by mouth See admin instructions. Take 1 tablet (20 meq) by mouth twice daily, also take an extra tablet the morning after a metolazone tablet)   . predniSONE (STERAPRED UNI-PAK 21 TAB) 10 MG (21) TBPK tablet Take 6-5-4-3-2-1 PO orally till gone   . PRESCRIPTION MEDICATION Inhale into the lungs at bedtime. CPAP   . ranitidine (ZANTAC) 150 MG tablet Take 150 mg by mouth daily as needed for heartburn.    . sertraline (ZOLOFT) 100 MG tablet TAKE 1 TABLET BY MOUTH DAILY   . torsemide (DEMADEX) 100 MG tablet Take 100 mg in AM and 50 mg in PM if needed for weight gain (Patient taking differently: Take 50-100 mg by mouth See admin instructions. Take 1 tablet (100 mg) by mouth every morning, may take an additional 1/2 tablet (50 mg) if needed for weight gain of 2 lbs/24 hours)   . TRULICITY 3.49 ZP/9.1TA SOPN INJECT 1 PEN INTO THE SKIN ONCE A WEEK. (Patient taking differently:  Inject 0.75 mg into the skin every Tuesday. )   . valsartan (DIOVAN) 320 MG tablet Take 160 mg by mouth daily.   [name] warfarin (COUMADIN) 5 MG tablet TAKE 1 TABLET BY MOUTH TWICE A DAY 12/23/2015: Pt verified twice daily dose   No facility-administered encounter medications on file as of 12/31/2015.     Functional Status:  In your present state of health, do you have any difficulty performing the following activities: 12/24/2015 12/17/2015  Hearing? N N  Vision? N N  Difficulty concentrating or making decisions? N N  Walking or climbing stairs? Y Y  Dressing or bathing? N N  Doing errands, shopping? N N  Preparing Food and eating ? - N  Using the Toilet? - N  In the past six months, have you accidently leaked urine? - N  Do  you have problems with loss of bowel control? - N  Managing your Medications? - N  Managing your Finances? - N  Housekeeping or managing your Housekeeping? - N  Some recent data might be hidden    Fall/Depression Screening: PHQ 2/9 Scores 12/17/2015 12/03/2015 10/24/2015 09/24/2015 08/05/2015 07/24/2015 06/24/2015  PHQ - 2 Score 0 0 0 0 0 0 0    Assessment / Plan: Referral Date:  12/24/15 Transition of Care Services, Initial Assessment 12/31/15 Telephonic RN CM Services:  12/31/15 Program:  TOC  12/31/15  (+14 day)  RN CM advised in signs and symptoms of infection.  RN CM encouraged to keep elevated to control swelling which will improve healing and pain management.  RN CM advised in hospital discharge orders to include PCP follow-up appt and lab work.  Patient stated he would call and schedule appt.   RN CM advised to please notify MD of any changes in condition prior to scheduled appt's.  RN CM provided contact name and # [phone] or main office # [phone] and 24-hour nurse line # [phone].  RN CM confirmed patient is aware of 911 services for urgent emergency needs. (especially difficulty breathing)  RN CM advised in next [facility] scheduled  contact call within one week for Transition of Care services.  RN CM advised to please notify MD of any changes in condition prior to scheduled appt's.   RN CM provided contact name and # [phone] or main office # [phone] and 24-hour nurse line # [phone].  RN CM confirmed patient is aware of 911 services for urgent emergency needs.  RN CM sent successful outreach letter and  [facility] Introductory package. RN CM sent Physician Enrollment/Barriers Letter and Initial Assessment to Primary MD RN CM notified [place] Management Assistant: agreed to services/case opened.  Mariann Laster, RN, BSN, [facility], CCM  [employer] Management Coordinator [phone] Direct [phone] Cell [phone] Office [phone] Fax

## 2016-01-05 ENCOUNTER — Other Ambulatory Visit: Payer: Self-pay | Admitting: Internal Medicine

## 2016-01-07 ENCOUNTER — Ambulatory Visit: Payer: Self-pay

## 2016-01-07 ENCOUNTER — Ambulatory Visit (INDEPENDENT_AMBULATORY_CARE_PROVIDER_SITE_OTHER): Payer: Medicare Other | Admitting: Internal Medicine

## 2016-01-07 ENCOUNTER — Encounter: Payer: Self-pay | Admitting: Internal Medicine

## 2016-01-07 VITALS — BP 116/58 | HR 54 | Temp 98.0°F | Resp 16 | Ht 71.0 in | Wt 186.0 lb

## 2016-01-07 DIAGNOSIS — M109 Gout, unspecified: Secondary | ICD-10-CM | POA: Diagnosis not present

## 2016-01-07 DIAGNOSIS — Z7901 Long term (current) use of anticoagulants: Secondary | ICD-10-CM

## 2016-01-07 DIAGNOSIS — I4892 Unspecified atrial flutter: Secondary | ICD-10-CM

## 2016-01-07 DIAGNOSIS — L03116 Cellulitis of left lower limb: Secondary | ICD-10-CM

## 2016-01-07 MED ORDER — COLCHICINE 0.6 MG PO TABS: 0.6000 mg | ORAL_TABLET | Freq: Every day | ORAL | 0 refills | Status: DC

## 2016-01-07 NOTE — Progress Notes (Signed)
Assessment and Plan: [phone] (all) [phone] (if 7 days high complexity)  hospital visit follow up for :    1. Cellulitis of left lower extremity -appears to be resolved -recommended continuing elevation of the foot and also wearing compression stockings  2. Acute gout of left foot, unspecified cause -cont colchicine 0.6 mg x 1 month then decrease to 0.3 mg for prophylaxis -consider starting allopurinol in 2 months.  Will avoid start for now because it will like initiate another flare -discussed diet that is appropriate for gout  3. Atrial flutter, unspecified type ([place]) -cont rate control -cont coumadin use  4. Long term current use of anticoagulant therapy -cont coumadin -dose adjust if necessary -recommended use of vaseline in nose - Protime-INR    Over 40 minutes of exam, counseling, chart review, and complex, high/moderate level critical decision making was performed this visit.   HPI 76 y.o.male presents for follow up from the hospital. Admit date to the hospital was 12/23/15, patient was discharged from the hospital on 12/27/15 and our office contacted the office the day after discharge to set up a follow up appointment, patient was admitted for: left foot pain and swelling.  He was diagnosed with cellulitis and was admitted.  He was given 4 days of vancomycin, IV solumedrol and also colchicine.  He clinically improved.  He did have elevated uric acid levels while in the hospital.  He was discharged home with prednisone taper and also doxycycline and keflex for an additional 4 days.  He reports that since he has been home he still has some foot pain. He reports that it is better than it was.  He reports that at this time it is tolerable.  He reports that he was eating a lot more oysters recently in his diet.  He is still taking 0.6 mg of the colchicine.  He is also elevating his feet as much as possible and wrapping his ankle at night to help relieve the swelling which is now very  minimal.  He is not wearing compression stockings.  He has no further redness in his foot.  He is taking his coumadin at 10 mg daily.  He has had no bleeding issues other than minimal leakage from his nose after blowing it frequently.  He feels that his allergies are suffering.    He reports that he has been having some cough and post nasal drainage.  He is having some pink drainage.  He reports that he is using saline at bedtime.  He forgot it.  He has been taking claritin.  He has been using afrin only when he blows his nose.    Images while in the hospital: Dg Foot Complete Left  Result Date: 12/23/2015 CLINICAL DATA:  76 year old male with left foot pain and swelling for 4 days. No known injury. Initial encounter. EXAM: LEFT FOOT - COMPLETE 3+ VIEW COMPARISON:  None. FINDINGS: Calcified peripheral vascular disease.   No subcutaneous gas. Calcaneus appears intact with minimal degenerative spurring. Small chronic ossific fragment adjacent to degenerative spurring at the neck of the talus. Other tarsal bones appear intact. First MTP osteoarthritis with mild to moderate joint space loss and subchondral sclerosis. Healed chronic fracture of the fifth proximal phalanx suspected. No acute osseous abnormality identified. IMPRESSION: 1.  No acute osseous abnormality identified about the left foot. 2. Calcified peripheral vascular disease. Electronically Signed   By: Genevie Ann M.D.   On: 12/23/2015 12:17    Past Medical History:  Diagnosis Date  .  Adrenal adenoma   . Anxiety   . Atypical atrial flutter ([place]) 8/15, 10/15   a. DCCV 08/2013. b. s/p RFA 10/2013.  [name] Basal cell carcinoma   . CAD (coronary artery disease)    a. 04/2013 CABG x 2: LIMA to LAD, SVG to RI, EVH via R thigh.  . Cellulitis 12/2015   left leg  . Chronic diastolic congestive heart failure ([place])   . CKD (chronic kidney disease), stage III   . Depression   . Diabetes mellitus type II   . Diverticulosis 2001  . DJD (degenerative joint  disease)   . GERD (gastroesophageal reflux disease)   . H/O hiatal hernia   . History of cardioversion    x3 (years uncertain)  . Hx of adenomatous colonic polyps   . Hyperlipidemia   . Hypertension   . Hypertensive cardiomyopathy ([place])   . Obstructive sleep apnea    compliant with CPAP  . 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 Persistent atrial fibrillation ([place])    a. s/p MAZE 04/2013 in setting of CABG. b. Amio stopped in 10/2013 after flutter ablation.  [name] PFO (patent foramen ovale)    a. Small PFO by TEE 10/2013.  [name] Pleural effusion, left    a. s/p thoracentesis 05/2013.  [name] Respiratory failure ([place])    a. Hypoxia 10/2013 - required supp O2 as inpatient, did not require it at discharge.  . S/P Maze operation for atrial fibrillation    a. 04/2013: Complete bilateral atrial lesion set using cryothermy and bipolar radiofrequency ablation with clipping of LA appendage (@ time of CABG)  . S/P Maze operation for atrial fibrillation 04/05/2013   Complete bilateral atrial lesion set using cryothermy and bipolar radiofrequency ablation with clipping of LA appendage via median sternotomy approach      Allergies  Allergen Reactions  . Sunflower Seed [Sunflower Oil] Swelling and Other (See Comments)    Tongue and lip swelling  . Horse-Derived Products Other (See Comments)    Per allergy skin test  . Tetanus Toxoids Other (See Comments)    Per allergy skin test      Current Outpatient Prescriptions on File Prior to Visit  Medication Sig Dispense Refill  . acetaminophen (TYLENOL) 500 MG tablet Take 500-1,000 mg by mouth every 6 (six) hours as needed (pain).    [name] ALPRAZolam (XANAX) 1 MG tablet Take 0.5 tablets (0.5 mg total) by mouth at bedtime as needed for sleep. (Patient taking differently: Take 0.5-1 mg by mouth at bedtime as needed for sleep. ) 60 tablet 0  . Alum Hydroxide-Mag  Carbonate (GAVISCON PO) Take 1 tablet by mouth daily as needed (acid reflux).    [name] atorvastatin (LIPITOR) 40 MG tablet TAKE 1 TABLET BY MOUTH EVERY DAY (Patient taking differently: TAKE 1/2 TABLET BY MOUTH ON MONDAY, WEDNESDAY, FRIDAY AT BEDTIME) 30 tablet 1  . azelastine (ASTELIN) 0.1 % nasal spray Place 2 sprays into both nostrils 2 (two) times daily. Use in each nostril as directed (Patient taking differently: Place 2 sprays into both nostrils 2 (two) times daily as needed for allergies. Use in each nostril as directed) 30 mL 2  . B Complex Vitamins (VITAMIN B COMPLEX PO) Take 1 tablet by mouth at bedtime.     [name] BAYER MICROLET LANCETS lancets Test once a day 100 each 3  . Blood Glucose Monitoring Suppl (ACCU-CHEK AVIVA PLUS) w/Device KIT Check  blood sugar 1 time  daily 1 kit 0  . carvedilol (COREG) 6.25 MG tablet TAKE 1 TABLET (6.25 MG TOTAL) BY MOUTH 2 (TWO) TIMES DAILY. 180 tablet 3  . Cholecalciferol (VITAMIN D-3) 5000 units TABS Take 5,000 Units by mouth at bedtime.    . colchicine 0.6 MG tablet Take 1 tablet (0.6 mg total) by mouth daily. 14 tablet 0  . diphenhydrAMINE (BENADRYL) 25 MG tablet Take 25 mg by mouth at bedtime as needed for allergies.     . fluticasone (FLONASE) 50 MCG/ACT nasal spray Place 1 spray into both nostrils daily as needed for allergies.     [name] glimepiride (AMARYL) 4 MG tablet Take 1 tablet 3 times daily. (Patient taking differently: Take 4 mg by mouth 3 (three) times daily with meals. Take 1 tablet 3 times daily) 270 tablet 1  . glucose blood (ACCU-CHEK AVIVA PLUS) test strip Check blood glucose 3 xdaily.   DX-E11.29 - fluctuating glucoses - uncontrolled Diabetes 300 each 12  . HYDROcodone-acetaminophen (NORCO/VICODIN) 5-325 MG tablet Take 1-2 tablets by mouth every 6 (six) hours as needed for moderate pain.     [name] lidocaine (LIDODERM) 5 % Place 1 patch onto the skin daily as needed (pain). Remove & Discard patch within 12 hours or as directed by MD    . loratadine  (CLARITIN) 10 MG tablet Take 10 mg by mouth daily.    . metFORMIN (GLUCOPHAGE-XR) 500 MG 24 hr tablet TAKE 2 TABLETS BY MOUTH TWO TIMES DAILY 360 tablet 1  . metolazone (ZAROXOLYN) 5 MG tablet Take 5 mg by mouth daily as needed (weight gain and edema). (Patient taking differently: Take 5 mg by mouth daily as needed (weight gain of >2 lbs/edema). Take 5 mg by mouth daily as needed (weight gain and edema)) 20 tablet 1  . Multiple Vitamin (MULTIVITAMIN WITH MINERALS) TABS tablet Take 1 tablet by mouth at bedtime.     . Multiple Vitamins-Minerals (PRESERVISION AREDS 2) CAPS Take 1 capsule by mouth 2 (two) times daily.     . polyvinyl alcohol (ARTIFICIAL TEARS) 1.4 % ophthalmic solution Place 1 drop into both eyes daily as needed for dry eyes.    . potassium chloride SA (K-DUR,KLOR-CON) 20 MEQ tablet Take 1 tablet (20 mEq total) by mouth 2 (two) times daily. (Patient taking differently: Take 20 mEq by mouth See admin instructions. Take 1 tablet (20 meq) by mouth twice daily, also take an extra tablet the morning after a metolazone tablet) 180 tablet 4  . PRESCRIPTION MEDICATION Inhale into the lungs at bedtime. CPAP    . ranitidine (ZANTAC) 150 MG tablet Take 150 mg by mouth daily as needed for heartburn.     . sertraline (ZOLOFT) 100 MG tablet TAKE 1 TABLET BY MOUTH DAILY 90 tablet 1  . torsemide (DEMADEX) 100 MG tablet Take 100 mg in AM and 50 mg in PM if needed for weight gain (Patient taking differently: Take 50-100 mg by mouth See admin instructions. Take 1 tablet (100 mg) by mouth every morning, may take an additional 1/2 tablet (50 mg) if needed for weight gain of 2 lbs/24 hours) 180 tablet 3  . TRULICITY 1.61 WR/6.0AV SOPN INJECT 1 PEN INTO THE SKIN ONCE A WEEK. (Patient taking differently: Inject 0.75 mg into the skin every Tuesday. ) 0.5 mL 3  . valsartan (DIOVAN) 320 MG tablet Take 160 mg by mouth daily.    [name] warfarin (COUMADIN) 5 MG tablet TAKE 1 TABLET BY MOUTH TWICE A DAY  180 tablet 3   No  current facility-administered medications on file prior to visit.     Review of Systems  Constitutional: Negative for chills, fever and malaise/fatigue.  HENT: Positive for congestion and nosebleeds. Negative for hearing loss.   Respiratory: Positive for cough. Negative for sputum production, shortness of breath and wheezing.   Cardiovascular: Negative for chest pain, palpitations and leg swelling.  Gastrointestinal: Negative for blood in stool, constipation, diarrhea, heartburn, melena, nausea and vomiting.  Genitourinary: Negative for dysuria, flank pain, frequency, hematuria and urgency.  Musculoskeletal: Negative for falls.  Skin: Negative for itching and rash.  Neurological: Negative for dizziness, tremors, sensory change and loss of consciousness.  Psychiatric/Behavioral: Negative for depression. The patient is not nervous/anxious and does not have insomnia.      Physical Exam: Filed Weights   01/07/16 1113  Weight: 186 lb (84.4 kg)   BP (!) 116/58   Pulse (!) 54   Temp 98 F (36.7 C) (Temporal)   Resp 16   Ht 5' 11"  (1.803 m)   Wt 186 lb (84.4 kg)   BMI 25.94 kg/m  General Appearance: Well nourished, in no apparent distress. Eyes: PERRLA, EOMs, conjunctiva no swelling or erythema Sinuses: No Frontal/maxillary tenderness ENT/Mouth: Ext aud canals clear, TMs without erythema, bulging. No erythema, swelling, or exudate on post pharynx.  Tonsils not swollen or erythematous. Hearing normal.  Neck: Supple, thyroid normal.  Respiratory: Respiratory effort normal, BS equal bilaterally without rales, rhonchi, wheezing or stridor.  Cardio: Ireg ireg with no 3/6 murmur to the left sternal border with radiation to the 2nd R ICS. Brisk peripheral pulses without edema.  Abdomen: Soft, + BS.  Non tender, no guarding, rebound, hernias, masses. Lymphatics: Non tender without lymphadenopathy.  Musculoskeletal: Full ROM, 5/5 strength, normal gait. Left foot with no swelling.  Still mildly  tender to palpation at the left tarsometatarsal junction. Skin: Warm, dry without rashes, lesions, ecchymosis.  Neuro: Cranial nerves intact. Normal muscle tone, no cerebellar symptoms. Sensation intact.  Psych: Awake and oriented X 3, normal affect, Insight and Judgment appropriate.     Starlyn Skeans, PA-C 11:19 AM [facility]

## 2016-01-08 ENCOUNTER — Ambulatory Visit: Payer: Self-pay

## 2016-01-08 LAB — PROTIME-INR
INR: 2.8 — ABNORMAL HIGH
Prothrombin Time: 28 s — ABNORMAL HIGH (ref 9.0–11.5)

## 2016-01-09 ENCOUNTER — Ambulatory Visit: Payer: Self-pay

## 2016-01-13 ENCOUNTER — Other Ambulatory Visit: Payer: Self-pay

## 2016-01-13 NOTE — Telephone Encounter (Signed)
This encounter was created in error - please disregard.

## 2016-01-13 NOTE — Patient Outreach (Signed)
[facility]) Care Management  01/13/2016  [name] 09/16/1940 [id number]   Referral Date:  12/24/15 Transition of Care Services, Initial Assessment 12/31/15 Telephonic RN CM Services:  12/31/15 Program:  TOC  12/31/15  (short term)  Outreach call #1 to patient.  No answer following several rings.  H/o patient completed Internal Medicine hospital follow-up appt 01/07/15 since last contact with RN CM.   RN CM scheduled for next outreach call within one week.   [name], BSN, RN, [place] Management Care Management Coordinator [phone] Direct [phone] Cell [phone] Office [phone] Fax [email or web address]

## 2016-01-14 ENCOUNTER — Other Ambulatory Visit: Payer: Self-pay

## 2016-01-14 NOTE — Patient Outreach (Addendum)
[name]) Care Management  01/14/2016  [name] 12/11/1940 [id number]   Referral Date:  12/24/15 Transition of Care Services, Initial Assessment 12/31/15 Telephonic RN CM Services:  12/31/15 Program:  TOC  12/31/15  (short term)  Outreach call #2 to patient.  Cell #.  Patient not reached.   RN CM left voice message with name and number.  H/o patient completed Internal Medicine hospital follow-up appt 01/07/15 since last contact with RN CM.   RN CM scheduled for next outreach call within one week.   [name], BSN, RN, [place] Management Care Management Coordinator [phone] Direct [phone] Cell [phone] Office [phone] Fax [email or web address]) Care Management  01/14/2016  [name] [birth year] [id number]  Transition of Care Services  Referral Date:  12/24/15 Source:  [name]  Issue:  H/o active with [place] prior to admission.  Written consent obtained.   Admissions: 1  12/23/2015 - 12/27/2015  Cellulitis Left Foot Insurance: UHC/A[employer] Medicare Complete  [address]  [place] [address] [phone] (M) [phone] (H)  Outreach call #1 to patient.  Patient completed call.   (Patient prefers to be contacted via cell # [phone]).  Best time to be reached is morning.  Providers: Primary MD: Dr. [name] -  CHF Clinic: Dr. Shaune Pascal Bensimhon -  Cardiologist: Dr. Cathie Olden: Pulmonologist: Juanito Doom, MD -  Opthamologist: Dr. Zigmund Daniel:   Social:  Patient lives in his home with his wife of 12 years.  Falls: None Caregiver: wife/Rita Sheer. Advanced Directives:yes DME: BP cuff, scales, Oxygen ([facility]), CPap ([facility]).   Co-morbidities: HTN, Atril fibrillation, Chronic diastolic CHF, CAD, Chronic kidney disease stage 3, GERD, OSA on CPAP Admissions: 1  12/23/2015 -  12/27/2015  Cellulitis Left Foot  Diabetes:   Results for [name], [name] (MRN [id number]) as of 01/14/2016 13:36  Ref. Range 06/14/2014 12:32 09/27/2014 12:31 01/08/2015 12:32 05/27/2015 10:19 12/03/2015 11:25  Hemoglobin A1C Latest Ref Range: <5.7 % 6.2 (H) 6.8 (H) 8.5 (H) 8.3 (H) 8.0 (H)     CHF  Patient under CHF Clinic: Dr. Clayborne Dana services.  Patient is avoiding salt, caffeine and reports improved compliance with self management interventions.  Patient has clear understanding of red/yellow/green zones.   BP 116/58 01/07/2016 Weight 186 lb (84 kg) 01/07/2016 Height 71 in (180 cm) 01/07/2016 BMI 26.00 (Overweight, Pre-obe 01/07/2016  Lipid Panel completed 12/03/2015 HDL 23.000 12/03/2015 LDL 49.000 12/03/2015 Cholesterol, total 129.000 12/03/2015 Triglycerides 287.000 12/03/2015 A1C 8.000 12/03/2015 Glucose Random 327.000 12/27/2015  Medications: Prevnar (PCV13) 01/30/2014 Pneumovax (PPS 10/20/2011 Flu Vaccine 10/03/2015 tDAP Vaccine 01/06/2000  Encounter Medications:  Outpatient Encounter Prescriptions as of 01/14/2016  Medication Sig Note  . acetaminophen (TYLENOL) 500 MG tablet Take 500-1,000 mg by mouth every 6 (six) hours as needed (pain).   [name] ALPRAZolam (XANAX) 1 MG tablet Take 0.5 tablets (0.5 mg total) by mouth at bedtime as needed for sleep. (Patient taking differently: Take 0.5-1 mg by mouth at bedtime as needed for sleep. ) 12/23/2015: Pt states that last night he took 2 tablets at 11pm and 1 tablet at 3am but that is an unusual dose.   [name] Alum Hydroxide-Mag Carbonate (GAVISCON PO) Take 1 tablet by mouth daily as needed (acid reflux).   [name] atorvastatin (LIPITOR) 40 MG tablet TAKE 1 TABLET BY MOUTH EVERY DAY (Patient taking differently: TAKE 1/2 TABLET BY MOUTH ON MONDAY, WEDNESDAY, FRIDAY AT BEDTIME)   .  azelastine (ASTELIN) 0.1 % nasal spray Place 2 sprays into both nostrils 2 (two) times daily. Use in each nostril as directed (Patient taking differently: Place 2 sprays into both  nostrils 2 (two) times daily as needed for allergies. Use in each nostril as directed)   . B Complex Vitamins (VITAMIN B COMPLEX PO) Take 1 tablet by mouth at bedtime.    [name] BAYER MICROLET LANCETS lancets Test once a day   . Blood Glucose Monitoring Suppl (ACCU-CHEK AVIVA PLUS) w/Device KIT Check blood sugar 1 time  daily   . carvedilol (COREG) 6.25 MG tablet TAKE 1 TABLET (6.25 MG TOTAL) BY MOUTH 2 (TWO) TIMES DAILY.   [name] Cholecalciferol (VITAMIN D-3) 5000 units TABS Take 5,000 Units by mouth at bedtime.   . colchicine 0.6 MG tablet Take 1 tablet (0.6 mg total) by mouth daily.   . diphenhydrAMINE (BENADRYL) 25 MG tablet Take 25 mg by mouth at bedtime as needed for allergies.    . fluticasone (FLONASE) 50 MCG/ACT nasal spray Place 1 spray into both nostrils daily as needed for allergies.    [name] glimepiride (AMARYL) 4 MG tablet Take 1 tablet 3 times daily. (Patient taking differently: Take 4 mg by mouth 3 (three) times daily with meals. Take 1 tablet 3 times daily)   . glucose blood (ACCU-CHEK AVIVA PLUS) test strip Check blood glucose 3 xdaily.   DX-E11.29 - fluctuating glucoses - uncontrolled Diabetes   . HYDROcodone-acetaminophen (NORCO/VICODIN) 5-325 MG tablet Take 1-2 tablets by mouth every 6 (six) hours as needed for moderate pain.    [name] lidocaine (LIDODERM) 5 % Place 1 patch onto the skin daily as needed (pain). Remove & Discard patch within 12 hours or as directed by MD   . loratadine (CLARITIN) 10 MG tablet Take 10 mg by mouth daily.   . metFORMIN (GLUCOPHAGE-XR) 500 MG 24 hr tablet TAKE 2 TABLETS BY MOUTH TWO TIMES DAILY   . metolazone (ZAROXOLYN) 5 MG tablet Take 5 mg by mouth daily as needed (weight gain and edema). (Patient taking differently: Take 5 mg by mouth daily as needed (weight gain of >2 lbs/edema). Take 5 mg by mouth daily as needed (weight gain and edema))   . Multiple Vitamin (MULTIVITAMIN WITH MINERALS) TABS tablet Take 1 tablet by mouth at bedtime.    . Multiple  Vitamins-Minerals (PRESERVISION AREDS 2) CAPS Take 1 capsule by mouth 2 (two) times daily.    . polyvinyl alcohol (ARTIFICIAL TEARS) 1.4 % ophthalmic solution Place 1 drop into both eyes daily as needed for dry eyes.   . potassium chloride SA (K-DUR,KLOR-CON) 20 MEQ tablet Take 1 tablet (20 mEq total) by mouth 2 (two) times daily. (Patient taking differently: Take 20 mEq by mouth See admin instructions. Take 1 tablet (20 meq) by mouth twice daily, also take an extra tablet the morning after a metolazone tablet)   . PRESCRIPTION MEDICATION Inhale into the lungs at bedtime. CPAP   . ranitidine (ZANTAC) 150 MG tablet Take 150 mg by mouth daily as needed for heartburn.    . sertraline (ZOLOFT) 100 MG tablet TAKE 1 TABLET BY MOUTH DAILY   . torsemide (DEMADEX) 100 MG tablet Take 100 mg in AM and 50 mg in PM if needed for weight gain (Patient taking differently: Take 50-100 mg by mouth See admin instructions. Take 1 tablet (100 mg) by mouth every morning, may take an additional 1/2 tablet (50 mg) if needed for weight gain of 2 lbs/24 hours)   . TRULICITY  0.75 MG/0.5ML SOPN INJECT 1 PEN INTO THE SKIN ONCE A WEEK. (Patient taking differently: Inject 0.75 mg into the skin every Tuesday. )   . valsartan (DIOVAN) 320 MG tablet Take 160 mg by mouth daily.   [name] warfarin (COUMADIN) 5 MG tablet TAKE 1 TABLET BY MOUTH TWICE A DAY 01/07/2016: Takes 10 mg QD   No facility-administered encounter medications on file as of 01/14/2016.     Functional Status:  In your present state of health, do you have any difficulty performing the following activities: 12/31/2015 12/24/2015  Hearing? N N  Vision? N N  Difficulty concentrating or making decisions? N N  Walking or climbing stairs? Y Y  Dressing or bathing? N N  Doing errands, shopping? N N  Preparing Food and eating ? N -  Using the Toilet? N -  In the past six months, have you accidently leaked urine? N -  Do you have problems with loss of bowel control? N -   Managing your Medications? N -  Managing your Finances? N -  Housekeeping or managing your Housekeeping? N -  Some recent data might be hidden   Fall Risk  01/14/2016 12/31/2015 12/17/2015 12/03/2015 10/24/2015  Falls in the past year? Yes Yes Yes No Yes  Number falls in past yr: 1 1 1  - 1  Injury with Fall? Yes Yes Yes - Yes  Risk Factor Category  High Fall Risk High Fall Risk High Fall Risk - High Fall Risk  Risk for fall due to : - History of fall(s);Impaired balance/gait;Impaired mobility History of fall(s);Impaired balance/gait - History of fall(s);Impaired balance/gait  Follow up Falls evaluation completed;Education provided;Falls prevention discussed Falls evaluation completed;Education provided;Falls prevention discussed Falls evaluation completed;Education provided;Falls prevention discussed - -    Assessment / Plan: Referral Date:  12/24/15 Transition of Care Services, Initial Assessment 12/31/15 Telephonic RN CM Services:  12/31/15 - 01/14/16 Program:  TOC  12/31/15  (+14 day) - 01/14/2016  No further Care Management needs identified.  Referral for [place]:  01/14/16  RN CM advised to please notify MD of any changes in condition prior to scheduled appt's.  RN CM provided contact name and # [phone] or main office # [phone] and 24-hour nurse line # [phone].  RN CM confirmed patient is aware of 911 services for urgent emergency needs. (especially difficulty breathing)  RN CM advised in next [name] scheduled contact call within 30 days for health coach services.  RN CM advised to please notify MD of any changes in condition prior to scheduled appt's.   RN CM provided contact name and # [phone] or main office # [phone] and 24-hour nurse line # [phone].  RN CM confirmed patient is aware of 911 services for urgent emergency needs.  Mariann Laster, RN, BSN, [facility], CCM  [employer] Management  Coordinator [phone] Direct [phone] Cell [phone] Office [phone] Fax

## 2016-01-24 ENCOUNTER — Encounter (INDEPENDENT_AMBULATORY_CARE_PROVIDER_SITE_OTHER): Payer: Medicare Other | Admitting: Ophthalmology

## 2016-01-24 ENCOUNTER — Other Ambulatory Visit: Payer: Self-pay | Admitting: Internal Medicine

## 2016-01-27 ENCOUNTER — Encounter: Payer: Self-pay | Admitting: Internal Medicine

## 2016-01-27 ENCOUNTER — Other Ambulatory Visit: Payer: Self-pay | Admitting: Internal Medicine

## 2016-01-27 MED ORDER — DULAGLUTIDE 0.75 MG/0.5ML ~~LOC~~ SOAJ: 1.0000 "pen " | SUBCUTANEOUS | 4 refills | Status: DC

## 2016-02-05 DIAGNOSIS — M79673 Pain in unspecified foot: Secondary | ICD-10-CM | POA: Diagnosis not present

## 2016-02-05 DIAGNOSIS — M1990 Unspecified osteoarthritis, unspecified site: Secondary | ICD-10-CM | POA: Diagnosis not present

## 2016-02-05 DIAGNOSIS — M19041 Primary osteoarthritis, right hand: Secondary | ICD-10-CM | POA: Diagnosis not present

## 2016-02-05 DIAGNOSIS — M79643 Pain in unspecified hand: Secondary | ICD-10-CM | POA: Diagnosis not present

## 2016-02-05 DIAGNOSIS — M109 Gout, unspecified: Secondary | ICD-10-CM | POA: Diagnosis not present

## 2016-02-05 DIAGNOSIS — M19042 Primary osteoarthritis, left hand: Secondary | ICD-10-CM | POA: Diagnosis not present

## 2016-02-06 ENCOUNTER — Encounter (INDEPENDENT_AMBULATORY_CARE_PROVIDER_SITE_OTHER): Payer: Medicare Other | Admitting: Ophthalmology

## 2016-02-06 DIAGNOSIS — H348322 Tributary (branch) retinal vein occlusion, left eye, stable: Secondary | ICD-10-CM

## 2016-02-06 DIAGNOSIS — H35033 Hypertensive retinopathy, bilateral: Secondary | ICD-10-CM

## 2016-02-06 DIAGNOSIS — H353121 Nonexudative age-related macular degeneration, left eye, early dry stage: Secondary | ICD-10-CM

## 2016-02-06 DIAGNOSIS — E11319 Type 2 diabetes mellitus with unspecified diabetic retinopathy without macular edema: Secondary | ICD-10-CM | POA: Diagnosis not present

## 2016-02-06 DIAGNOSIS — H43813 Vitreous degeneration, bilateral: Secondary | ICD-10-CM

## 2016-02-06 DIAGNOSIS — I10 Essential (primary) hypertension: Secondary | ICD-10-CM | POA: Diagnosis not present

## 2016-02-06 DIAGNOSIS — E113392 Type 2 diabetes mellitus with moderate nonproliferative diabetic retinopathy without macular edema, left eye: Secondary | ICD-10-CM

## 2016-02-11 ENCOUNTER — Encounter: Payer: Self-pay | Admitting: Physician Assistant

## 2016-02-11 ENCOUNTER — Ambulatory Visit (INDEPENDENT_AMBULATORY_CARE_PROVIDER_SITE_OTHER): Payer: Medicare Other | Admitting: Physician Assistant

## 2016-02-11 VITALS — BP 130/70 | HR 73 | Temp 98.1°F | Resp 14 | Ht 71.0 in | Wt 190.4 lb

## 2016-02-11 DIAGNOSIS — M109 Gout, unspecified: Secondary | ICD-10-CM

## 2016-02-11 DIAGNOSIS — I43 Cardiomyopathy in diseases classified elsewhere: Secondary | ICD-10-CM | POA: Diagnosis not present

## 2016-02-11 DIAGNOSIS — Z7901 Long term (current) use of anticoagulants: Secondary | ICD-10-CM

## 2016-02-11 DIAGNOSIS — I480 Paroxysmal atrial fibrillation: Secondary | ICD-10-CM | POA: Diagnosis not present

## 2016-02-11 DIAGNOSIS — I4892 Unspecified atrial flutter: Secondary | ICD-10-CM | POA: Diagnosis not present

## 2016-02-11 DIAGNOSIS — I5032 Chronic diastolic (congestive) heart failure: Secondary | ICD-10-CM | POA: Diagnosis not present

## 2016-02-11 DIAGNOSIS — I110 Hypertensive heart disease with heart failure: Secondary | ICD-10-CM

## 2016-02-11 DIAGNOSIS — I5033 Acute on chronic diastolic (congestive) heart failure: Secondary | ICD-10-CM

## 2016-02-11 DIAGNOSIS — M1A9XX0 Chronic gout, unspecified, without tophus (tophi): Secondary | ICD-10-CM | POA: Insufficient documentation

## 2016-02-11 LAB — CBC WITH DIFFERENTIAL/PLATELET
BASOS PCT: 0 %
Basophils Absolute: 0 cells/uL (ref 0–200)
Eosinophils Absolute: 152 cells/uL (ref 15–500)
Eosinophils Relative: 2 %
HEMATOCRIT: 34.9 % — AB (ref 38.5–50.0)
HEMOGLOBIN: 11.6 g/dL — AB (ref 13.2–17.1)
LYMPHS ABS: 760 {cells}/uL — AB (ref 850–3900)
Lymphocytes Relative: 10 %
MCH: 31.4 pg (ref 27.0–33.0)
MCHC: 33.2 g/dL (ref 32.0–36.0)
MCV: 94.3 fL (ref 80.0–100.0)
MONO ABS: 532 {cells}/uL (ref 200–950)
MPV: 8.5 fL (ref 7.5–12.5)
Monocytes Relative: 7 %
NEUTROS ABS: 6156 {cells}/uL (ref 1500–7800)
Neutrophils Relative %: 81 %
Platelets: 190 10*3/uL (ref 140–400)
RBC: 3.7 MIL/uL — ABNORMAL LOW (ref 4.20–5.80)
RDW: 14.6 % (ref 11.0–15.0)
WBC: 7.6 10*3/uL (ref 3.8–10.8)

## 2016-02-11 LAB — BASIC METABOLIC PANEL WITH GFR
BUN: 24 mg/dL (ref 7–25)
CHLORIDE: 103 mmol/L (ref 98–110)
CO2: 31 mmol/L (ref 20–31)
Calcium: 9.5 mg/dL (ref 8.6–10.3)
Creat: 1.62 mg/dL — ABNORMAL HIGH (ref 0.70–1.18)
GFR, EST AFRICAN AMERICAN: 47 mL/min — AB (ref >=60)
GFR, EST NON AFRICAN AMERICAN: 41 mL/min — AB (ref >=60)
GLUCOSE: 193 mg/dL — AB (ref 65–99)
POTASSIUM: 4.2 mmol/L (ref 3.5–5.3)
Sodium: 144 mmol/L (ref 135–146)

## 2016-02-11 MED ORDER — GLIMEPIRIDE 4 MG PO TABS: ORAL_TABLET | ORAL | 1 refills | Status: DC

## 2016-02-11 NOTE — Progress Notes (Signed)
Coumadin follow up  Patient is on Coumadin for Atrial flutter, unspecified type ([place]) [I48.92] Patient's last INR is  Lab Results  Component Value Date   INR 2.8 (H) 01/07/2016   INR 2.84 12/27/2015   INR 2.56 12/26/2015    Patient denies SOB, CP, dizziness, nose bleeds, easy bleeding, and blood in stool/urine. His coumadin dose was not changed last visit, he is on 47m every day at this time.  He has not taken ABX, has not missed any doses and denies a fall.    He also has complicated heart history with history of CABG and MAZE in 2010, failed DCCV with refractory flutter, following with Dr. BHaroldine Lawsand has done well with diueresis.  He had cellulitis or gout left leg in Dec, had IV ABX at that time but no ABX after that, he is still on colchicine he is on 1/2 tablet but he is on allopurinol 535m Lab Results  Component Value Date   LABURIC 9.0 (H) 12/25/2015    Wt Readings from Last 5 Encounters:  02/11/16 190 lb 6.4 oz (86.4 kg)  01/07/16 186 lb (84.4 kg)  12/27/15 190 lb 1.6 oz (86.2 kg)  12/03/15 190 lb (86.2 kg)  11/12/15 192 lb (87.1 kg)     Current Outpatient Prescriptions on File Prior to Visit  Medication Sig Dispense Refill  . acetaminophen (TYLENOL) 500 MG tablet Take 500-1,000 mg by mouth every 6 (six) hours as needed (pain).    . Marland KitchenLPRAZolam (XANAX) 1 MG tablet Take 0.5 tablets (0.5 mg total) by mouth at bedtime as needed for sleep. (Patient taking differently: Take 0.5-1 mg by mouth at bedtime as needed for sleep. ) 60 tablet 0  . Alum Hydroxide-Mag Carbonate (GAVISCON PO) Take 1 tablet by mouth daily as needed (acid reflux).    . Marland Kitchentorvastatin (LIPITOR) 40 MG tablet TAKE 1 TABLET BY MOUTH EVERY DAY (Patient taking differently: TAKE 1/2 TABLET BY MOUTH ON MONDAY, WEDNESDAY, FRIDAY AT BEDTIME) 30 tablet 1  . azelastine (ASTELIN) 0.1 % nasal spray Place 2 sprays into both nostrils 2 (two) times daily. Use in each nostril as directed (Patient taking differently: Place 2  sprays into both nostrils 2 (two) times daily as needed for allergies. Use in each nostril as directed) 30 mL 2  . B Complex Vitamins (VITAMIN B COMPLEX PO) Take 1 tablet by mouth at bedtime.     . Marland KitchenAYER MICROLET LANCETS lancets Test once a day 100 each 3  . Blood Glucose Monitoring Suppl (ACCU-CHEK AVIVA PLUS) w/Device KIT Check blood sugar 1 time  daily 1 kit 0  . carvedilol (COREG) 6.25 MG tablet TAKE 1 TABLET (6.25 MG TOTAL) BY MOUTH 2 (TWO) TIMES DAILY. 180 tablet 3  . Cholecalciferol (VITAMIN D-3) 5000 units TABS Take 5,000 Units by mouth at bedtime.    . colchicine 0.6 MG tablet Take 1 tablet (0.6 mg total) by mouth daily. 90 tablet 0  . diphenhydrAMINE (BENADRYL) 25 MG tablet Take 25 mg by mouth at bedtime as needed for allergies.     . Dulaglutide (TRULICITY) 0.7.51GZG/0.1VCOPN Inject 1 pen into the skin once a week. 3 pen 4  . fluticasone (FLONASE) 50 MCG/ACT nasal spray Place 1 spray into both nostrils daily as needed for allergies.     . Marland Kitchenlimepiride (AMARYL) 4 MG tablet Take 1 tablet 3 times daily. (Patient taking differently: Take 4 mg by mouth 3 (three) times daily with meals. Take 1 tablet 3 times daily) 270 tablet  1  . HYDROcodone-acetaminophen (NORCO/VICODIN) 5-325 MG tablet Take 1-2 tablets by mouth every 6 (six) hours as needed for moderate pain.     [name] lidocaine (LIDODERM) 5 % Place 1 patch onto the skin daily as needed (pain). Remove & Discard patch within 12 hours or as directed by MD    . loratadine (CLARITIN) 10 MG tablet Take 10 mg by mouth daily.    . metFORMIN (GLUCOPHAGE-XR) 500 MG 24 hr tablet TAKE 2 TABLETS BY MOUTH TWO TIMES DAILY 360 tablet 1  . metolazone (ZAROXOLYN) 5 MG tablet Take 5 mg by mouth daily as needed (weight gain and edema). (Patient taking differently: Take 5 mg by mouth daily as needed (weight gain of >2 lbs/edema). Take 5 mg by mouth daily as needed (weight gain and edema)) 20 tablet 1  . Multiple Vitamin (MULTIVITAMIN WITH MINERALS) TABS tablet Take 1  tablet by mouth at bedtime.     . Multiple Vitamins-Minerals (PRESERVISION AREDS 2) CAPS Take 1 capsule by mouth 2 (two) times daily.     . polyvinyl alcohol (ARTIFICIAL TEARS) 1.4 % ophthalmic solution Place 1 drop into both eyes daily as needed for dry eyes.    . potassium chloride SA (K-DUR,KLOR-CON) 20 MEQ tablet Take 1 tablet (20 mEq total) by mouth 2 (two) times daily. (Patient taking differently: Take 20 mEq by mouth See admin instructions. Take 1 tablet (20 meq) by mouth twice daily, also take an extra tablet the morning after a metolazone tablet) 180 tablet 4  . PRESCRIPTION MEDICATION Inhale into the lungs at bedtime. CPAP    . ranitidine (ZANTAC) 150 MG tablet Take 150 mg by mouth daily as needed for heartburn.     . sertraline (ZOLOFT) 100 MG tablet TAKE 1 TABLET BY MOUTH DAILY 90 tablet 1  . torsemide (DEMADEX) 100 MG tablet Take 100 mg in AM and 50 mg in PM if needed for weight gain (Patient taking differently: Take 50-100 mg by mouth See admin instructions. Take 1 tablet (100 mg) by mouth every morning, may take an additional 1/2 tablet (50 mg) if needed for weight gain of 2 lbs/24 hours) 180 tablet 3  . valsartan (DIOVAN) 320 MG tablet Take 160 mg by mouth daily.    [name] warfarin (COUMADIN) 5 MG tablet TAKE 1 TABLET BY MOUTH TWICE A DAY 180 tablet 3   No current facility-administered medications on file prior to visit.    Past Medical History:  Diagnosis Date  . Adrenal adenoma   . Anxiety   . Atypical atrial flutter ([place]) 8/15, 10/15   a. DCCV 08/2013. b. s/p RFA 10/2013.  [name] Basal cell carcinoma   . CAD (coronary artery disease)    a. 04/2013 CABG x 2: LIMA to LAD, SVG to RI, EVH via R thigh.  . Cellulitis 12/2015   left leg  . Chronic diastolic congestive heart failure ([place])   . CKD (chronic kidney disease), stage III   . Depression   . Diabetes mellitus type II   . Diverticulosis 2001  . DJD (degenerative joint disease)   . GERD (gastroesophageal reflux disease)   . H/O  hiatal hernia   . History of cardioversion    x3 (years uncertain)  . Hx of adenomatous colonic polyps   . Hyperlipidemia   . Hypertension   . Hypertensive cardiomyopathy ([place])   . Obstructive sleep apnea    compliant with CPAP  . 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 Persistent atrial fibrillation ([place])    a. s/p MAZE 04/2013 in setting of CABG. b. Amio stopped in 10/2013 after flutter ablation.  [name] PFO (patent foramen ovale)    a. Small PFO by TEE 10/2013.  [name] Pleural effusion, left    a. s/p thoracentesis 05/2013.  [name] Respiratory failure ([place])    a. Hypoxia 10/2013 - required supp O2 as inpatient, did not require it at discharge.  . S/P Maze operation for atrial fibrillation    a. 04/2013: Complete bilateral atrial lesion set using cryothermy and bipolar radiofrequency ablation with clipping of LA appendage (@ time of CABG)  . S/P Maze operation for atrial fibrillation 04/05/2013   Complete bilateral atrial lesion set using cryothermy and bipolar radiofrequency ablation with clipping of LA appendage via median sternotomy approach    Allergies  Allergen Reactions  . Sunflower Seed [Sunflower Oil] Swelling and Other (See Comments)    Tongue and lip swelling  . Horse-Derived Products Other (See Comments)    Per allergy skin test  . Tetanus Toxoids Other (See Comments)    Per allergy skin test    Review of Systems  Constitutional: Positive for malaise/fatigue. Negative for chills and fever.  HENT: Negative.   Respiratory: Positive for shortness of breath (better). Negative for cough and wheezing.   Cardiovascular: Negative for chest pain, orthopnea, leg swelling (improved) and PND.  Gastrointestinal: Negative.   Genitourinary: Positive for frequency. Negative for dysuria, flank pain, hematuria and urgency.  Musculoskeletal: Negative.   Skin: Negative.   Neurological:  Negative for dizziness, tingling, tremors, sensory change, speech change, focal weakness, seizures, loss of consciousness and weakness.  Psychiatric/Behavioral: Negative.  Negative for depression and suicidal ideas.     Physical: Blood pressure 130/70, pulse 73, temperature 98.1 F (36.7 C), resp. rate 14, height 5' 11"  (1.803 m), weight 190 lb 6.4 oz (86.4 kg), SpO2 95 %. Filed Weights   02/11/16 1033  Weight: 190 lb 6.4 oz (86.4 kg)    General Appearance: Well nourished, in no apparent distress. ENT/Mouth: Nares clear with no erythema, swelling, mucus on turbinates. No ulcers, cracking, on lips. No erythema, swelling, or exudate on post pharynx.  Neck: Supple, thyroid normal.  Respiratory: lungs CTAB  Cardio:  Irregular, irregular rhythm, with holosystolic murmur without rubs or gallops. No edema  Abdomen: distended, with bowl sounds. Non tender, no guarding, rebound, hernias, masses, or organomegaly.  Skin: Warm, dry without rashes, lesions, ecchymosis.  Neuro: Unremarkable  Assessment and plan: Chronic anticoagulation- check INR and will adjust medication according to labs.  Discussed if patient falls to immediately contact office or go to ER. Discussed foods that can increase or decrease Coumadin levels. Patient understands to call the office before starting a new medication. Follow up in one month.   PAF (paroxysmal atrial fibrillation) (HCC) Rate controlled -     Protime-INR  Chronic diastolic congestive heart failure (HCC) Weight stable, continue follow up  Hypertensive cardiomyopathy, with heart failure (HCC) Weight stable, continue follow up  Acute on chronic diastolic CHF (congestive heart failure), NYHA class 3 (HCC) Weight stable, continue follow up Weight daily  Atrial Flutter Control blood pressure, cholesterol, glucose, increase exercise.  Continue coumadin  Future Appointments Date Time Provider [place]  03/23/2016 2:00 PM [name], MD  [facility] None  04/23/2016 9:30 AM Hayden Pedro, MD [facility] None

## 2016-02-12 LAB — PROTIME-INR
INR: 2.6 — ABNORMAL HIGH
Prothrombin Time: 26.4 s — ABNORMAL HIGH (ref 9.0–11.5)

## 2016-02-24 ENCOUNTER — Other Ambulatory Visit: Payer: Self-pay | Admitting: Physician Assistant

## 2016-02-24 ENCOUNTER — Telehealth: Payer: Self-pay | Admitting: Internal Medicine

## 2016-02-24 MED ORDER — GLIMEPIRIDE 4 MG PO TABS: ORAL_TABLET | ORAL | 1 refills | Status: DC

## 2016-02-24 NOTE — Telephone Encounter (Signed)
Requesting new rx, use local Pharm, [facility] Glyiperide 4mg ? 1 BID, insurane wont pay for TID, out of medicine.

## 2016-02-25 ENCOUNTER — Encounter: Payer: Self-pay | Admitting: Internal Medicine

## 2016-03-05 DIAGNOSIS — M1990 Unspecified osteoarthritis, unspecified site: Secondary | ICD-10-CM | POA: Diagnosis not present

## 2016-03-05 DIAGNOSIS — M109 Gout, unspecified: Secondary | ICD-10-CM | POA: Diagnosis not present

## 2016-03-05 DIAGNOSIS — E790 Hyperuricemia without signs of inflammatory arthritis and tophaceous disease: Secondary | ICD-10-CM | POA: Diagnosis not present

## 2016-03-05 DIAGNOSIS — M79643 Pain in unspecified hand: Secondary | ICD-10-CM | POA: Diagnosis not present

## 2016-03-22 NOTE — Progress Notes (Signed)
[facility]   [name], M.D.    Uvaldo Bristle. Silverio Lay, P.A.-C      Starlyn Skeans, P.A.-C  [facility]                [address] [place] [address] Telephone [phone] Telefax [phone] Annual  Screening/Preventative Visit  & Comprehensive Evaluation & Examination     This very nice 76 y.o. MWM presents for a Screening/Preventative Visit & comprehensive evaluation and management of multiple medical co-morbidities.  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     HTN predates since 61. Patient's BP has been controlled at home.  Today's BP is at goal -  120/72.  In 2015, he underwent CABG & MAZE and also RFA for Afib and he's [facility] by Dr Missy Sabins.  Patient has been on Coumadin w/o any untoward bleeding. Patient denies any cardiac symptoms as chest pain, palpitations, shortness of breath, dizziness or ankle swelling.     Patient's hyperlipidemia is controlled with diet and medications. Patient denies myalgias or other medication SE's. 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  Patient endorses very poor dietary compliance and consequent A1c's have not been in control with CBG's ranging up to the 200+ range despite therapy with Metformin, Glimepiride and Trulicity inj weekly. Patient denies reactive hypoglycemic symptoms, visual blurring, diabetic polys or paresthesias. Last A1c was as expected not at goal:  Lab Results  Component Value Date   HGBA1C 8.0 (H) 12/03/2015       Finally, patient has history of Vitamin D Deficiency ("39" in 2008) and last vitamin D  was at goal: Lab Results  Component Value Date   VD25OH 64 12/03/2015   Current Outpatient Prescriptions on File Prior to Visit  Medication Sig  . acetaminophen  500 MG tablet Take 500-1,000 mg  every 6 hrs as needed  . allopurinol  100 MG tablet TAKES 1/2 TABDAILY.  [name] ALPRAZolam1 MG tablet Take 0.5 tab at bedtime as needed   . GAVISCON Take 1 tabl daily  . atorvastatin 40 MG tablet AKE 1/2 TAB 3x / week - MWF  . azelastine  0.1 % nasal spray 2 sprays into  nostrils 2 xdaily.   [name] VIT B COMPLEX Take 1 tablet by mouth at bedtime.   . carvedilol  6.25 MG tablet TAKE 1 TAB 2 x DAILY.  [name] VITAMIN D 5000 units  Take  at bedtime.  . colchicine 0.6 MG  Take 1 tablet (0.6 mg total) by mouth daily.  . diphenhydrAMINE  25 MG  Take 25 mg by mouth at bedtime as needed for allergies.   . TRULICITY SOPN Inject 1 pen into the skin once a week.  [name] FLONASE  nasal spray Place 1 spray into both nostrils daily as needed for allergies.   [name] glimepiride  4 MG tablet Take 1 tablet 2 times daily.  . NORCO 5-325 MG  Take 1-2 tablets by mouth every 6 (six) hours as needed for moderate pain.   [name]  lidocaine  5 % Place 1 patch onto the skin daily as needed   . loratadine  10 MG tablet Take 10 mg by mouth daily.  . metFORMIN-XR 500 MG  TAKE 2 TAB 2 x DAILY  . metolazone  5 MG tablet Take 5 mg by mouth daily as needed   . Multi-Vit w/Min Take 1 tablet by mouth at bedtime.   [name] PRESERVISION AREDS 2 Take 1 capsule by mouth 2  X daily.   [name] K-DUR  20 MEQ Take 1 tab 2 x daily  . ranitidine 150 MG tablet Take 150 mg by mouth daily   . sertraline100 MG tablet TAKE 1 TAB DAILY  . torsemide  100 MG tablet Take 100 mg in AM and 50 mg in PM   . valsartan  320 MG tablet Take 160 mg  daily.  [name] warfarin  5 MG tablet TAKE 1 TAB 2 x A DAY   Allergies  Allergen Reactions  . Sunflower Seed [Sunflower Oil] Swelling and Other (See Comments)    Tongue and lip swelling  . Horse-Derived Products Other (See Comments)    Per allergy  skin test  . Tetanus Toxoids Other (See Comments)    Per allergy skin test   Past Medical History:  Diagnosis Date  . Adrenal adenoma   . Anxiety   . Atypical atrial flutter ([place]) 8/15, 10/15   a. DCCV 08/2013. b. s/p RFA 10/2013.  [name] Basal cell carcinoma   . CAD (coronary artery disease)    a. 04/2013 CABG x 2: LIMA to LAD, SVG to RI, EVH via R thigh.  . Cellulitis 12/2015   left leg  . Chronic diastolic congestive heart failure ([place])   . CKD (chronic kidney disease), stage III   . Depression   . Diabetes mellitus type II   . Diverticulosis 2001  . DJD (degenerative joint disease)   . GERD (gastroesophageal reflux disease)   . H/O hiatal hernia   . History of cardioversion    x3 (years uncertain)  . Hx of adenomatous colonic polyps   . Hyperlipidemia   . Hypertension   . Hypertensive cardiomyopathy ([place])   . Obstructive sleep apnea    compliant with CPAP  . 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 Persistent atrial fibrillation ([place])    a. s/p MAZE 04/2013 in setting of CABG. b. Amio stopped in 10/2013 after flutter ablation.  [name] PFO (patent foramen ovale)    a. Small PFO by TEE 10/2013.  [name] Pleural effusion, left    a. s/p thoracentesis 05/2013.  [name] Respiratory failure ([place])    a. Hypoxia 10/2013 - required supp O2 as inpatient, did not require it at discharge.  . S/P Maze operation for atrial fibrillation    a. 04/2013: Complete bilateral atrial lesion set using cryothermy and bipolar radiofrequency ablation with clipping of LA appendage (@ time of CABG)  . S/P Maze operation for atrial fibrillation 04/05/2013   Complete bilateral atrial lesion set using cryothermy and bipolar radiofrequency ablation with clipping of LA appendage via median sternotomy approach    Health Maintenance  Topic Date Due  . HEMOGLOBIN A1C  06/01/2016  . OPHTHALMOLOGY EXAM  06/10/2016  . FOOT  EXAM  08/04/2016  . TETANUS/TDAP  07/05/2025  . INFLUENZA VACCINE  Completed  . PNA vac Low Risk Adult  Completed   Past Surgical History:  Procedure Laterality  Date  . ATRIAL FIBRILLATION ABLATION N/A 10/26/2013   Procedure: ATRIAL FIBRILLATION ABLATION;  Surgeon: Coralyn Mark, MD;  Location: [place] [facility];  Service: Cardiovascular;  Laterality: N/A;  . BASAL CELL CARCINOMA EXCISION     x3 on face  . CARDIAC CATHETERIZATION     myocardial bridge but no cad  . CARDIOVERSION N/A 08/23/2013   Procedure: CARDIOVERSION;  Surgeon: Sanda Klein, MD;  Location: [facility];  Service: Cardiovascular;  Laterality: N/A;  . CARPOMETACARPEL SUSPENSION PLASTY Left 02/14/2014   Procedure: CARPOMETACARPEL ([place]) SUSPENSIONPLASTY THUMB  WITH  ABDUCTOR POLLICIS LONGUS TRANSFER AND STENOSING TENOSYNOVITIS RELEASE LEFT WRIST;  Surgeon: Charlotte Crumb, MD;  Location: [facility];  Service: Orthopedics;  Laterality: Left;  . CATARACT EXTRACTION     bilateral  . CORONARY ARTERY BYPASS GRAFT N/A 04/05/2013   Procedure: CORONARY ARTERY BYPASS GRAFTING (CABG) TIMES TWO USING LEFT INTERNAL MAMMARY ARTERY AND RIGHT SAPHENOUS LEG VEIN HARVESTED ENDOSCOPICALLY;  Surgeon: Rexene Alberts, MD;  Location: [facility];  Service: Open Heart Surgery;  Laterality: N/A;  . GREAT TOE ARTHRODESIS, INTERPHALANGEAL JOINT     Right foot  . INTRAOPERATIVE TRANSESOPHAGEAL ECHOCARDIOGRAM N/A 04/05/2013   Procedure: INTRAOPERATIVE TRANSESOPHAGEAL ECHOCARDIOGRAM;  Surgeon: Rexene Alberts, MD;  Location: [facility];  Service: Open Heart Surgery;  Laterality: N/A;  . LEFT HEART CATHETERIZATION WITH CORONARY ANGIOGRAM N/A 03/07/2013   Procedure: LEFT HEART CATHETERIZATION WITH CORONARY ANGIOGRAM;  Surgeon: Burnell Blanks, MD;  Location: [facility] CATH LAB;  Service: Cardiovascular;  Laterality: N/A;  . MAZE N/A 04/05/2013   Procedure: MAZE;  Surgeon: Rexene Alberts, MD;  Location: [facility];  Service: Open Heart Surgery;  Laterality: N/A;   . Polinydal cyst     Removed  . POLYPECTOMY    . Retina repair-right    . RIGHT HEART CATHETERIZATION N/A 05/03/2014   Procedure: RIGHT HEART CATH;  Surgeon: Jolaine Artist, MD;  Location: [facility] CATH LAB;  Service: Cardiovascular;  Laterality: N/A;  . TEE WITHOUT CARDIOVERSION N/A 08/23/2013   Procedure: TRANSESOPHAGEAL ECHOCARDIOGRAM (TEE);  Surgeon: Sanda Klein, MD;  Location: [facility] ENDOSCOPY;  Service: Cardiovascular;  Laterality: N/A;  . TEE WITHOUT CARDIOVERSION N/A 10/26/2013   Procedure: TRANSESOPHAGEAL ECHOCARDIOGRAM (TEE);  Surgeon: Sueanne Margarita, MD;  Location: [facility] ENDOSCOPY;  Service: Cardiovascular;  Laterality: N/A;  . TEE WITHOUT CARDIOVERSION N/A 06/05/2014   Procedure: TRANSESOPHAGEAL ECHOCARDIOGRAM (TEE);  Surgeon: Thayer Headings, MD;  Location: [facility] ENDOSCOPY;  Service: Cardiovascular;  Laterality: N/A;  . TRAPEZIUM RESECTION     Family History  Problem Relation Age of Onset  . Dementia Father   . Colon cancer Mother     Family History/Uncle   . Colon polyps Mother     Family History  . Atrial fibrillation Mother   . Hypertension Mother   . Colon polyps Sister     Family history  . Diabetes Maternal Uncle   . Stroke Paternal Uncle    Social History   Social History  . Marital status: Married    Spouse name: N/A  . Number of children: 1  . Years of education: N/A   Occupational History  . retired Software engineer    Social History Main Topics  . Smoking status: Former Smoker    Packs/day: 4.00    Years: 25.00    Types: Cigarettes    Quit date: 01/05/1981  . Smokeless tobacco: Never Used  . Alcohol use 0.6 oz/week    1 Shots of liquor per week  Comment: 1-5 drinks per week  . Drug use: No  . Sexual activity: Not on file   Social History Narrative   Daily caffeine-yes   Patient gets regular exercise.   Pt lives in [place] with spouse.  Retired Software engineer.                                                                                                             ROS Constitutional: Denies fever, chills, weight loss/gain, headaches, insomnia,  night sweats or change in appetite. Does c/o fatigue. Eyes: Denies redness, blurred vision, diplopia, discharge, itchy or watery eyes.  ENT: Denies discharge, congestion, post nasal drip, epistaxis, sore throat, earache, hearing loss, dental pain, Tinnitus, Vertigo, Sinus pain or snoring.  Cardio: Denies chest pain, palpitations, irregular heartbeat, syncope, dyspnea, diaphoresis, orthopnea, PND, claudication or edema Respiratory: denies cough, dyspnea, DOE, pleurisy, hoarseness, laryngitis or wheezing.  Gastrointestinal: Denies dysphagia, heartburn, reflux, water brash, pain, cramps, nausea, vomiting, bloating, diarrhea, constipation, hematemesis, melena, hematochezia, jaundice or hemorrhoids Genitourinary: Denies dysuria, frequency, urgency, nocturia, hesitancy, discharge, hematuria or flank pain Musculoskeletal: Denies arthralgia, myalgia, stiffness, Jt. Swelling, pain, limp or strain/sprain. Denies Falls. Skin: Denies puritis, rash, hives, warts, acne, eczema or change in skin lesion Neuro: No weakness, tremor, incoordination, spasms, paresthesia or pain Psychiatric: Denies confusion, memory loss or sensory loss. Denies Depression. Endocrine: Denies change in weight, skin, hair change, nocturia, and paresthesia, diabetic polys, visual blurring or hyper / hypo glycemic episodes.  Heme/Lymph: No excessive bleeding, bruising or enlarged lymph nodes.  Physical Exam  BP 120/72   Pulse 72   Temp 97.6 F (36.4 C)   Resp 16   Ht 5\' 11"  (1.803 m)   Wt 184 lb 12.8 oz (83.8 kg)   BMI 25.77 kg/m   General Appearance: Well nourished, in no apparent distress.  Eyes: PERRLA, EOMs, conjunctiva no swelling or erythema, normal fundi and vessels. Sinuses: No frontal/maxillary tenderness ENT/Mouth: EACs patent / TMs  nl. Nares clear without erythema, swelling, mucoid exudates. Oral hygiene is good. No  erythema, swelling, or exudate. Tongue normal, non-obstructing. Tonsils not swollen or erythematous. Hearing normal.  Neck: Supple, thyroid normal. No bruits, nodes or JVD. Respiratory: Respiratory effort normal.  BS equal and clear bilateral without rales, rhonci, wheezing or stridor. Cardio: Heart sounds are normal with regular rate and rhythm and no murmurs, rubs or gallops. Peripheral pulses are normal and equal bilaterally without edema. No aortic or femoral bruits. Chest: symmetric with normal excursions and percussion.  Abdomen: Soft, with Nl bowel sounds. Nontender, no guarding, rebound, hernias, masses, or organomegaly.  Lymphatics: Non tender without lymphadenopathy.  Genitourinary: No hernias.Testes nl. DRE - prostate nl for age - smooth & firm w/o nodules. Musculoskeletal: Full ROM all peripheral extremities, joint stability, 5/5 strength, and normal gait. Skin: Warm and dry without rashes, lesions, cyanosis, clubbing or  ecchymosis. Tender sl inflamed seb cyst R mid buttock.  Neuro: Cranial nerves intact, reflexes equal bilaterally. Normal muscle tone, no cerebellar symptoms. Sensation intact.  Pysch: Alert and oriented X 3 with normal  affect, insight and judgment appropriate.   Assessment and Plan  1. Annual Preventative/Screening Exam    2. Essential hypertension  - Microalbumin / creatinine urine ratio - EKG 12-Lead - [place], RETROPERITNL ABD,  LTD - Urinalysis, Routine w reflex microscopic - CBC with Differential/Platelet - BASIC METABOLIC PANEL WITH GFR - Magnesium - TSH  3. Mixed hyperlipidemia  - EKG 12-Lead - [place], RETROPERITNL ABD,  LTD - Hepatic function panel - Lipid panel - TSH  4. T2_NIDDM w/CKD2  (HCC)  - Microalbumin / creatinine urine ratio - EKG 12-Lead - [place], RETROPERITNL ABD,  LTD - Hemoglobin A1c - Insulin, random  5. Vitamin D deficiency  - VITAMIN D 25 Hydroxy  6. S/P CABG x 2  - EKG 12-Lead  7. PAF (paroxysmal atrial fibrillation)  (HCC)  - EKG 12-Lead - Protime-INR  8. Gout  - Uric acid  9. OSA on CPAP   10. GERD   11. Screening for ischemic heart disease  - EKG 12-Lead  12. Screening for AAA (aortic abdominal aneurysm)  - [place], RETROPERITNL ABD,  LTD  13. Encounter for colorectal cancer screening  - POC Hemoccult Bld/Stl   14. Prostate cancer screening  - PSA  15. Long term current use of anticoagulant therapy  - Protime-INR  16. Medication management  - Urinalysis, Routine w reflex microscopic  17. Chronic diastolic congestive heart failure (HCC)  - potassium chloride SA (K-DUR,KLOR-CON) 20 MEQ tablet; Take 1 tablet (20 mEq total) by mouth 2 (two) times daily.  Dispense: 180 tablet; Refill: 4       Continue prudent diet as discussed, weight control, BP monitoring, regular exercise, and medications as discussed.  Discussed med effects and SE's. Routine screening labs and tests as requested with regular follow-up as recommended. Over 40 minutes of exam, counseling, chart review and high complex critical decision making was performed ,

## 2016-03-22 NOTE — Patient Instructions (Signed)

## 2016-03-23 ENCOUNTER — Encounter: Payer: Self-pay | Admitting: Internal Medicine

## 2016-03-23 ENCOUNTER — Ambulatory Visit (INDEPENDENT_AMBULATORY_CARE_PROVIDER_SITE_OTHER): Payer: Medicare Other | Admitting: Internal Medicine

## 2016-03-23 VITALS — BP 120/72 | HR 72 | Temp 97.6°F | Resp 16 | Ht 71.0 in | Wt 184.8 lb

## 2016-03-23 DIAGNOSIS — M109 Gout, unspecified: Secondary | ICD-10-CM

## 2016-03-23 DIAGNOSIS — E782 Mixed hyperlipidemia: Secondary | ICD-10-CM | POA: Diagnosis not present

## 2016-03-23 DIAGNOSIS — Z0000 Encounter for general adult medical examination without abnormal findings: Secondary | ICD-10-CM

## 2016-03-23 DIAGNOSIS — I5032 Chronic diastolic (congestive) heart failure: Secondary | ICD-10-CM

## 2016-03-23 DIAGNOSIS — E1122 Type 2 diabetes mellitus with diabetic chronic kidney disease: Secondary | ICD-10-CM

## 2016-03-23 DIAGNOSIS — Z125 Encounter for screening for malignant neoplasm of prostate: Secondary | ICD-10-CM

## 2016-03-23 DIAGNOSIS — Z951 Presence of aortocoronary bypass graft: Secondary | ICD-10-CM

## 2016-03-23 DIAGNOSIS — G4733 Obstructive sleep apnea (adult) (pediatric): Secondary | ICD-10-CM

## 2016-03-23 DIAGNOSIS — I480 Paroxysmal atrial fibrillation: Secondary | ICD-10-CM | POA: Diagnosis not present

## 2016-03-23 DIAGNOSIS — Z136 Encounter for screening for cardiovascular disorders: Secondary | ICD-10-CM

## 2016-03-23 DIAGNOSIS — I10 Essential (primary) hypertension: Secondary | ICD-10-CM

## 2016-03-23 DIAGNOSIS — N183 Chronic kidney disease, stage 3 unspecified: Secondary | ICD-10-CM

## 2016-03-23 DIAGNOSIS — E559 Vitamin D deficiency, unspecified: Secondary | ICD-10-CM | POA: Diagnosis not present

## 2016-03-23 DIAGNOSIS — Z7901 Long term (current) use of anticoagulants: Secondary | ICD-10-CM

## 2016-03-23 DIAGNOSIS — Z1212 Encounter for screening for malignant neoplasm of rectum: Secondary | ICD-10-CM

## 2016-03-23 DIAGNOSIS — Z1211 Encounter for screening for malignant neoplasm of colon: Secondary | ICD-10-CM

## 2016-03-23 DIAGNOSIS — Z79899 Other long term (current) drug therapy: Secondary | ICD-10-CM

## 2016-03-23 DIAGNOSIS — K2100 Gastro-esophageal reflux disease with esophagitis, without bleeding: Secondary | ICD-10-CM

## 2016-03-23 DIAGNOSIS — Z0001 Encounter for general adult medical examination with abnormal findings: Secondary | ICD-10-CM

## 2016-03-23 DIAGNOSIS — Z9989 Dependence on other enabling machines and devices: Secondary | ICD-10-CM

## 2016-03-23 LAB — BASIC METABOLIC PANEL WITH GFR
BUN: 25 mg/dL (ref 7–25)
CALCIUM: 9.6 mg/dL (ref 8.6–10.3)
CO2: 32 mmol/L — ABNORMAL HIGH (ref 20–31)
Chloride: 94 mmol/L — ABNORMAL LOW (ref 98–110)
Creat: 1.75 mg/dL — ABNORMAL HIGH (ref 0.70–1.18)
GFR, EST AFRICAN AMERICAN: 43 mL/min — AB (ref >=60)
GFR, Est Non African American: 37 mL/min — ABNORMAL LOW (ref >=60)
GLUCOSE: 218 mg/dL — AB (ref 65–99)
Potassium: 3.5 mmol/L (ref 3.5–5.3)
Sodium: 139 mmol/L (ref 135–146)

## 2016-03-23 LAB — HEPATIC FUNCTION PANEL
ALBUMIN: 4.8 g/dL (ref 3.6–5.1)
ALK PHOS: 84 U/L (ref 40–115)
ALT: 18 U/L (ref 9–46)
AST: 17 U/L (ref 10–35)
Bilirubin, Direct: 0.2 mg/dL (ref <=0.2)
Indirect Bilirubin: 0.9 mg/dL (ref 0.2–1.2)
TOTAL PROTEIN: 7.7 g/dL (ref 6.1–8.1)
Total Bilirubin: 1.1 mg/dL (ref 0.2–1.2)

## 2016-03-23 LAB — CBC WITH DIFFERENTIAL/PLATELET
Basophils Absolute: 0 cells/uL (ref 0–200)
Basophils Relative: 0 %
EOS ABS: 91 {cells}/uL (ref 15–500)
Eosinophils Relative: 1 %
HEMATOCRIT: 40.3 % (ref 38.5–50.0)
Hemoglobin: 13.4 g/dL (ref 13.2–17.1)
LYMPHS PCT: 11 %
Lymphs Abs: 1001 cells/uL (ref 850–3900)
MCH: 31.2 pg (ref 27.0–33.0)
MCHC: 33.3 g/dL (ref 32.0–36.0)
MCV: 93.7 fL (ref 80.0–100.0)
MONO ABS: 546 {cells}/uL (ref 200–950)
MPV: 8.8 fL (ref 7.5–12.5)
Monocytes Relative: 6 %
NEUTROS PCT: 82 %
Neutro Abs: 7462 cells/uL (ref 1500–7800)
Platelets: 232 10*3/uL (ref 140–400)
RBC: 4.3 MIL/uL (ref 4.20–5.80)
RDW: 14.1 % (ref 11.0–15.0)
WBC: 9.1 10*3/uL (ref 3.8–10.8)

## 2016-03-23 LAB — LIPID PANEL
CHOLESTEROL: 148 mg/dL (ref <200)
HDL: 26 mg/dL — ABNORMAL LOW (ref >40)
LDL CALC: 63 mg/dL (ref <100)
TRIGLYCERIDES: 293 mg/dL — AB (ref <150)
Total CHOL/HDL Ratio: 5.7 Ratio — ABNORMAL HIGH (ref <5.0)
VLDL: 59 mg/dL — ABNORMAL HIGH (ref <30)

## 2016-03-23 LAB — TSH: TSH: 1.55 mIU/L (ref 0.40–4.50)

## 2016-03-23 MED ORDER — AZELASTINE HCL 0.1 % NA SOLN: 2.0000 | Freq: Two times a day (BID) | NASAL | 2 refills | Status: DC

## 2016-03-23 MED ORDER — POTASSIUM CHLORIDE CRYS ER 20 MEQ PO TBCR: 20.0000 meq | EXTENDED_RELEASE_TABLET | Freq: Two times a day (BID) | ORAL | 4 refills | Status: DC

## 2016-03-23 MED ORDER — METOLAZONE 5 MG PO TABS: ORAL_TABLET | ORAL | 1 refills | Status: DC

## 2016-03-23 MED ORDER — ATORVASTATIN CALCIUM 40 MG PO TABS: 40.0000 mg | ORAL_TABLET | Freq: Every day | ORAL | 1 refills | Status: DC

## 2016-03-23 MED ORDER — COLCHICINE 0.6 MG PO TABS: 0.6000 mg | ORAL_TABLET | Freq: Every day | ORAL | 0 refills | Status: DC

## 2016-03-23 MED ORDER — ALPRAZOLAM 1 MG PO TABS: 0.5000 mg | ORAL_TABLET | Freq: Every evening | ORAL | 0 refills | Status: DC | PRN

## 2016-03-23 MED ORDER — TORSEMIDE 100 MG PO TABS: ORAL_TABLET | ORAL | 3 refills | Status: DC

## 2016-03-24 LAB — MICROALBUMIN / CREATININE URINE RATIO
Creatinine, Urine: 35 mg/dL (ref 20–370)
Microalb Creat Ratio: 80 mcg/mg creat — ABNORMAL HIGH (ref <30)
Microalb, Ur: 2.8 mg/dL

## 2016-03-24 LAB — URINALYSIS, ROUTINE W REFLEX MICROSCOPIC
Bilirubin Urine: NEGATIVE
GLUCOSE, UA: NEGATIVE
Hgb urine dipstick: NEGATIVE
Ketones, ur: NEGATIVE
LEUKOCYTES UA: NEGATIVE
Nitrite: NEGATIVE
PH: 6.5 (ref 5.0–8.0)
PROTEIN: NEGATIVE
SPECIFIC GRAVITY, URINE: 1.008 (ref 1.001–1.035)

## 2016-03-24 LAB — PROTIME-INR
INR: 2.3 — ABNORMAL HIGH
Prothrombin Time: 23.3 s — ABNORMAL HIGH (ref 9.0–11.5)

## 2016-03-24 LAB — PSA: PSA: 1 ng/mL (ref <=4.0)

## 2016-03-24 LAB — INSULIN, RANDOM: INSULIN: 11.6 u[IU]/mL (ref 2.0–19.6)

## 2016-03-24 LAB — MAGNESIUM: Magnesium: 2.2 mg/dL (ref 1.5–2.5)

## 2016-03-24 LAB — HEMOGLOBIN A1C
Hgb A1c MFr Bld: 7.6 % — ABNORMAL HIGH (ref <5.7)
MEAN PLASMA GLUCOSE: 171 mg/dL

## 2016-03-24 LAB — VITAMIN D 25 HYDROXY (VIT D DEFICIENCY, FRACTURES): Vit D, 25-Hydroxy: 65 ng/mL (ref 30–100)

## 2016-03-24 LAB — URIC ACID: URIC ACID, SERUM: 7.4 mg/dL (ref 4.0–8.0)

## 2016-04-01 DIAGNOSIS — E790 Hyperuricemia without signs of inflammatory arthritis and tophaceous disease: Secondary | ICD-10-CM | POA: Diagnosis not present

## 2016-04-01 DIAGNOSIS — M1990 Unspecified osteoarthritis, unspecified site: Secondary | ICD-10-CM | POA: Diagnosis not present

## 2016-04-01 DIAGNOSIS — M109 Gout, unspecified: Secondary | ICD-10-CM | POA: Diagnosis not present

## 2016-04-01 DIAGNOSIS — M79643 Pain in unspecified hand: Secondary | ICD-10-CM | POA: Diagnosis not present

## 2016-04-06 ENCOUNTER — Other Ambulatory Visit: Payer: Self-pay | Admitting: Internal Medicine

## 2016-04-06 DIAGNOSIS — N183 Chronic kidney disease, stage 3 unspecified: Secondary | ICD-10-CM

## 2016-04-06 DIAGNOSIS — E1122 Type 2 diabetes mellitus with diabetic chronic kidney disease: Secondary | ICD-10-CM

## 2016-04-06 DIAGNOSIS — Z0001 Encounter for general adult medical examination with abnormal findings: Secondary | ICD-10-CM

## 2016-04-23 ENCOUNTER — Encounter (INDEPENDENT_AMBULATORY_CARE_PROVIDER_SITE_OTHER): Payer: Medicare Other | Admitting: Ophthalmology

## 2016-04-23 DIAGNOSIS — H348312 Tributary (branch) retinal vein occlusion, right eye, stable: Secondary | ICD-10-CM | POA: Diagnosis not present

## 2016-04-23 DIAGNOSIS — H353122 Nonexudative age-related macular degeneration, left eye, intermediate dry stage: Secondary | ICD-10-CM

## 2016-04-23 DIAGNOSIS — I10 Essential (primary) hypertension: Secondary | ICD-10-CM | POA: Diagnosis not present

## 2016-04-23 DIAGNOSIS — H43813 Vitreous degeneration, bilateral: Secondary | ICD-10-CM

## 2016-04-23 DIAGNOSIS — E11311 Type 2 diabetes mellitus with unspecified diabetic retinopathy with macular edema: Secondary | ICD-10-CM | POA: Diagnosis not present

## 2016-04-23 DIAGNOSIS — E113392 Type 2 diabetes mellitus with moderate nonproliferative diabetic retinopathy without macular edema, left eye: Secondary | ICD-10-CM

## 2016-04-23 DIAGNOSIS — H35033 Hypertensive retinopathy, bilateral: Secondary | ICD-10-CM

## 2016-04-23 DIAGNOSIS — H338 Other retinal detachments: Secondary | ICD-10-CM

## 2016-04-23 DIAGNOSIS — E113311 Type 2 diabetes mellitus with moderate nonproliferative diabetic retinopathy with macular edema, right eye: Secondary | ICD-10-CM

## 2016-04-23 DIAGNOSIS — H35371 Puckering of macula, right eye: Secondary | ICD-10-CM

## 2016-04-25 NOTE — Progress Notes (Signed)
Coumadin follow up  Patient is on Coumadin for PAF (paroxysmal atrial fibrillation) ([place]) [I48.0] Patient's last INR is  Lab Results  Component Value Date   INR 2.3 (H) 03/23/2016   INR 2.6 (H) 02/11/2016   INR 2.8 (H) 01/07/2016    Patient denies SOB, CP, dizziness, nose bleeds, easy bleeding, and blood in stool/urine. His coumadin dose was not changed last visit, he is on 70m every day at this time.  He has not taken ABX, has not missed any doses and denies a fall.    He also has complicated heart history with history of CABG and MAZE in 2010, failed DCCV with refractory flutter, following with Dr. BHaroldine Lawsand has done well with diueresis.  He had cellulitis or gout left leg, had recent gout flare but had recent increase to allopurinol to 1573m   Lab Results  Component Value Date   LABURIC 7.4 03/23/2016    Wt Readings from Last 5 Encounters:  04/27/16 190 lb 9.6 oz (86.5 kg)  03/23/16 184 lb 12.8 oz (83.8 kg)  02/11/16 190 lb 6.4 oz (86.4 kg)  01/07/16 186 lb (84.4 kg)  12/27/15 190 lb 1.6 oz (86.2 kg)     Current Outpatient Prescriptions on File Prior to Visit  Medication Sig Dispense Refill  . ACCU-CHEK AVIVA PLUS test strip CHECK BLOOD GLUCOSE 3 TIMES DAILY. 300 each 1  . acetaminophen (TYLENOL) 500 MG tablet Take 500-1,000 mg by mouth every 6 (six) hours as needed (pain).    . Marland Kitchenllopurinol (ZYLOPRIM) 100 MG tablet Take 100 mg by mouth daily.     . Marland KitchenLPRAZolam (XANAX) 1 MG tablet Take 0.5 tablets (0.5 mg total) by mouth at bedtime as needed for sleep. 60 tablet 0  . Alum Hydroxide-Mag Carbonate (GAVISCON PO) Take 1 tablet by mouth daily as needed (acid reflux).    . Marland Kitchentorvastatin (LIPITOR) 40 MG tablet Take 1 tablet (40 mg total) by mouth daily. 30 tablet 1  . azelastine (ASTELIN) 0.1 % nasal spray Place 2 sprays into both nostrils 2 (two) times daily. Use in each nostril as directed 30 mL 2  . B Complex Vitamins (VITAMIN B COMPLEX PO) Take 1 tablet by mouth at bedtime.      . Marland KitchenAYER MICROLET LANCETS lancets Test once a day 100 each 3  . Blood Glucose Monitoring Suppl (ACCU-CHEK AVIVA PLUS) w/Device KIT Check blood sugar 1 time  daily 1 kit 0  . carvedilol (COREG) 6.25 MG tablet TAKE 1 TABLET (6.25 MG TOTAL) BY MOUTH 2 (TWO) TIMES DAILY. 180 tablet 3  . Cholecalciferol (VITAMIN D-3) 5000 units TABS Take 5,000 Units by mouth at bedtime.    . colchicine 0.6 MG tablet Take 1 tablet (0.6 mg total) by mouth daily. 90 tablet 0  . diphenhydrAMINE (BENADRYL) 25 MG tablet Take 25 mg by mouth at bedtime as needed for allergies.     . Dulaglutide (TRULICITY) 0.6.38GLH/7.3SKOPN Inject 1 pen into the skin once a week. 3 pen 4  . fluticasone (FLONASE) 50 MCG/ACT nasal spray Place 1 spray into both nostrils daily as needed for allergies.     . Marland Kitchenlimepiride (AMARYL) 4 MG tablet Take 1 tablet 2 times daily. 180 tablet 1  . HYDROcodone-acetaminophen (NORCO/VICODIN) 5-325 MG tablet Take 1-2 tablets by mouth every 6 (six) hours as needed for moderate pain.     . Marland Kitchenidocaine (LIDODERM) 5 % Place 1 patch onto the skin daily as needed (pain). Remove & Discard patch within 12 hours or  as directed by MD    . loratadine (CLARITIN) 10 MG tablet Take 10 mg by mouth daily.    . metFORMIN (GLUCOPHAGE-XR) 500 MG 24 hr tablet TAKE 2 TABLETS BY MOUTH TWO TIMES DAILY 360 tablet 1  . metolazone (ZAROXOLYN) 5 MG tablet Take 5 mg by mouth daily as needed (weight gain and edema). 20 tablet 1  . Multiple Vitamin (MULTIVITAMIN WITH MINERALS) TABS tablet Take 1 tablet by mouth at bedtime.     . Multiple Vitamins-Minerals (PRESERVISION AREDS 2) CAPS Take 1 capsule by mouth 2 (two) times daily.     . polyvinyl alcohol (ARTIFICIAL TEARS) 1.4 % ophthalmic solution Place 1 drop into both eyes daily as needed for dry eyes.    . potassium chloride SA (K-DUR,KLOR-CON) 20 MEQ tablet Take 1 tablet (20 mEq total) by mouth 2 (two) times daily. 180 tablet 4  . PRESCRIPTION MEDICATION Inhale into the lungs at bedtime. CPAP     . ranitidine (ZANTAC) 150 MG tablet Take 150 mg by mouth daily as needed for heartburn.     . sertraline (ZOLOFT) 100 MG tablet TAKE 1 TABLET BY MOUTH DAILY 90 tablet 1  . torsemide (DEMADEX) 100 MG tablet Take 100 mg in AM and 50 mg in PM if needed for weight gain 180 tablet 3  . valsartan (DIOVAN) 320 MG tablet Take 160 mg by mouth daily.    [name] warfarin (COUMADIN) 5 MG tablet TAKE 1 TABLET BY MOUTH TWICE A DAY 180 tablet 3   No current facility-administered medications on file prior to visit.    Past Medical History:  Diagnosis Date  . Adrenal adenoma   . Anxiety   . Atypical atrial flutter ([place]) 8/15, 10/15   a. DCCV 08/2013. b. s/p RFA 10/2013.  [name] Basal cell carcinoma   . CAD (coronary artery disease)    a. 04/2013 CABG x 2: LIMA to LAD, SVG to RI, EVH via R thigh.  . Cellulitis 12/2015   left leg  . Chronic diastolic congestive heart failure ([place])   . CKD (chronic kidney disease), stage III   . Depression   . Diabetes mellitus type II   . Diverticulosis 2001  . DJD (degenerative joint disease)   . GERD (gastroesophageal reflux disease)   . H/O hiatal hernia   . History of cardioversion    x3 (years uncertain)  . Hx of adenomatous colonic polyps   . Hyperlipidemia   . Hypertension   . Hypertensive cardiomyopathy ([place])   . Obstructive sleep apnea    compliant with CPAP  . 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 Persistent atrial fibrillation ([place])    a. s/p MAZE 04/2013 in setting of CABG. b. Amio stopped in 10/2013 after flutter ablation.  [name] PFO (patent foramen ovale)    a. Small PFO by TEE 10/2013.  [name] Pleural effusion, left    a. s/p thoracentesis 05/2013.  [name] Respiratory failure ([place])    a. Hypoxia 10/2013 - required supp O2 as inpatient, did not require it at discharge.  . S/P Maze operation for atrial fibrillation    a. 04/2013: Complete bilateral atrial lesion  set using cryothermy and bipolar radiofrequency ablation with clipping of LA appendage (@ time of CABG)  . S/P Maze operation for atrial fibrillation 04/05/2013   Complete bilateral atrial lesion set using cryothermy and bipolar radiofrequency ablation with clipping of LA appendage via median  sternotomy approach    Allergies  Allergen Reactions  . Sunflower Seed [Sunflower Oil] Swelling and Other (See Comments)    Tongue and lip swelling  . Horse-Derived Products Other (See Comments)    Per allergy skin test  . Tetanus Toxoids Other (See Comments)    Per allergy skin test    Review of Systems  Constitutional: Positive for malaise/fatigue. Negative for chills and fever.  HENT: Negative.   Respiratory: Positive for shortness of breath (better). Negative for cough and wheezing.   Cardiovascular: Negative for chest pain, orthopnea, leg swelling (improved) and PND.  Gastrointestinal: Negative.   Genitourinary: Positive for frequency. Negative for dysuria, flank pain, hematuria and urgency.  Musculoskeletal: Negative.   Skin: Negative.   Neurological: Negative for dizziness, tingling, tremors, sensory change, speech change, focal weakness, seizures, loss of consciousness and weakness.  Psychiatric/Behavioral: Negative.  Negative for depression and suicidal ideas.     Physical: Blood pressure 122/80, pulse 73, temperature 97.3 F (36.3 C), resp. rate 16, height 5' 11"  (1.803 m), weight 190 lb 9.6 oz (86.5 kg), SpO2 95 %. Filed Weights   04/27/16 1523  Weight: 190 lb 9.6 oz (86.5 kg)    General Appearance: Well nourished, in no apparent distress. ENT/Mouth: Nares clear with no erythema, swelling, mucus on turbinates. No ulcers, cracking, on lips. No erythema, swelling, or exudate on post pharynx.  Neck: Supple, thyroid normal.  Respiratory: lungs CTAB  Cardio:  Irregular, irregular rhythm, with holosystolic murmur without rubs or gallops. No edema  Abdomen: distended, with bowl sounds.  Non tender, no guarding, rebound, hernias, masses, or organomegaly.  Skin: Warm, dry without rashes, lesions, ecchymosis.  Neuro: Unremarkable  Assessment and plan: Chronic anticoagulation- check INR and will adjust medication according to labs.  Discussed if patient falls to immediately contact office or go to ER. Discussed foods that can increase or decrease Coumadin levels. Patient understands to call the office before starting a new medication. Follow up in one month.   PAF (paroxysmal atrial fibrillation) (HCC) Rate controlled -     Protime-INR  Chronic diastolic congestive heart failure (HCC) Weight stable, continue follow up  Hypertensive cardiomyopathy, with heart failure (HCC) Weight stable, continue follow up Weight daily  Gout - recheck Uric acid as needed, Diet discussed, continue medications.   Future Appointments Date Time Provider [place]  05/28/2016 11:00 AM Vicie Mutters, PA-C [facility] None  05/29/2016 11:15 AM [name], MD [facility] None  07/01/2016 4:30 PM [name], MD [facility] None  07/16/2016 8:15 AM Hayden Pedro, MD [facility] None  04/21/2017 2:00 PM [name], MD [facility] None

## 2016-04-27 ENCOUNTER — Ambulatory Visit (INDEPENDENT_AMBULATORY_CARE_PROVIDER_SITE_OTHER): Payer: Medicare Other | Admitting: Physician Assistant

## 2016-04-27 ENCOUNTER — Ambulatory Visit: Payer: Self-pay | Admitting: Internal Medicine

## 2016-04-27 ENCOUNTER — Encounter: Payer: Self-pay | Admitting: Physician Assistant

## 2016-04-27 VITALS — BP 122/80 | HR 73 | Temp 97.3°F | Resp 16 | Ht 71.0 in | Wt 190.6 lb

## 2016-04-27 DIAGNOSIS — I5032 Chronic diastolic (congestive) heart failure: Secondary | ICD-10-CM | POA: Diagnosis not present

## 2016-04-27 DIAGNOSIS — M109 Gout, unspecified: Secondary | ICD-10-CM

## 2016-04-27 DIAGNOSIS — I110 Hypertensive heart disease with heart failure: Secondary | ICD-10-CM

## 2016-04-27 DIAGNOSIS — I43 Cardiomyopathy in diseases classified elsewhere: Secondary | ICD-10-CM | POA: Diagnosis not present

## 2016-04-27 DIAGNOSIS — I480 Paroxysmal atrial fibrillation: Secondary | ICD-10-CM | POA: Diagnosis not present

## 2016-04-27 LAB — BASIC METABOLIC PANEL WITHOUT GFR
BUN: 29 mg/dL — ABNORMAL HIGH (ref 7–25)
CO2: 26 mmol/L (ref 20–31)
Calcium: 9.3 mg/dL (ref 8.6–10.3)
Chloride: 100 mmol/L (ref 98–110)
Creat: 1.75 mg/dL — ABNORMAL HIGH (ref 0.70–1.18)
GFR, Est African American: 42 mL/min — ABNORMAL LOW
GFR, Est Non African American: 37 mL/min — ABNORMAL LOW
Glucose, Bld: 169 mg/dL — ABNORMAL HIGH (ref 65–99)
Potassium: 3.9 mmol/L (ref 3.5–5.3)
Sodium: 140 mmol/L (ref 135–146)

## 2016-04-27 LAB — CBC WITH DIFFERENTIAL/PLATELET
Basophils Absolute: 0 {cells}/uL (ref 0–200)
Basophils Relative: 0 %
Eosinophils Absolute: 74 {cells}/uL (ref 15–500)
Eosinophils Relative: 1 %
HCT: 37.6 % — ABNORMAL LOW (ref 38.5–50.0)
Hemoglobin: 12.3 g/dL — ABNORMAL LOW (ref 13.2–17.1)
Lymphocytes Relative: 15 %
Lymphs Abs: 1110 {cells}/uL (ref 850–3900)
MCH: 30.6 pg (ref 27.0–33.0)
MCHC: 32.7 g/dL (ref 32.0–36.0)
MCV: 93.5 fL (ref 80.0–100.0)
MPV: 8.6 fL (ref 7.5–12.5)
Monocytes Absolute: 592 {cells}/uL (ref 200–950)
Monocytes Relative: 8 %
Neutro Abs: 5624 {cells}/uL (ref 1500–7800)
Neutrophils Relative %: 76 %
Platelets: 245 10*3/uL (ref 140–400)
RBC: 4.02 MIL/uL — ABNORMAL LOW (ref 4.20–5.80)
RDW: 14.6 % (ref 11.0–15.0)
WBC: 7.4 10*3/uL (ref 3.8–10.8)

## 2016-04-27 NOTE — Patient Instructions (Signed)

## 2016-04-28 LAB — PROTIME-INR
INR: 1.9 — AB
PROTHROMBIN TIME: 20 s — AB (ref 9.0–11.5)

## 2016-04-28 LAB — URIC ACID: URIC ACID, SERUM: 7 mg/dL (ref 4.0–8.0)

## 2016-04-29 ENCOUNTER — Other Ambulatory Visit: Payer: Self-pay

## 2016-04-29 DIAGNOSIS — E790 Hyperuricemia without signs of inflammatory arthritis and tophaceous disease: Secondary | ICD-10-CM | POA: Diagnosis not present

## 2016-04-29 DIAGNOSIS — N189 Chronic kidney disease, unspecified: Secondary | ICD-10-CM | POA: Diagnosis not present

## 2016-04-29 DIAGNOSIS — Z1211 Encounter for screening for malignant neoplasm of colon: Secondary | ICD-10-CM

## 2016-04-29 DIAGNOSIS — Z1212 Encounter for screening for malignant neoplasm of rectum: Principal | ICD-10-CM

## 2016-04-29 DIAGNOSIS — M109 Gout, unspecified: Secondary | ICD-10-CM | POA: Diagnosis not present

## 2016-04-29 DIAGNOSIS — M1990 Unspecified osteoarthritis, unspecified site: Secondary | ICD-10-CM | POA: Diagnosis not present

## 2016-04-29 LAB — POC HEMOCCULT BLD/STL (HOME/3-CARD/SCREEN)
Card #3 Fecal Occult Blood, POC: NEGATIVE
FECAL OCCULT BLD: NEGATIVE
Fecal Occult Blood, POC: NEGATIVE

## 2016-05-18 ENCOUNTER — Other Ambulatory Visit: Payer: Self-pay | Admitting: *Deleted

## 2016-05-18 MED ORDER — COLCHICINE 0.6 MG PO TABS: 0.6000 mg | ORAL_TABLET | Freq: Every day | ORAL | 1 refills | Status: DC

## 2016-05-19 ENCOUNTER — Other Ambulatory Visit: Payer: Self-pay

## 2016-05-19 MED ORDER — COLCHICINE 0.6 MG PO TABS
0.6000 mg | ORAL_TABLET | Freq: Every day | ORAL | 1 refills | Status: DC
Start: 2016-05-19 — End: 2016-05-27

## 2016-05-25 ENCOUNTER — Telehealth: Payer: Self-pay | Admitting: *Deleted

## 2016-05-25 NOTE — Telephone Encounter (Signed)
Per Dr Melford Aase, patient was advised to stop his Coumadin for 4 days prior to his cyst removal in our office.

## 2016-05-27 ENCOUNTER — Other Ambulatory Visit: Payer: Self-pay

## 2016-05-27 MED ORDER — COLCHICINE 0.6 MG PO TABS: 0.6000 mg | ORAL_TABLET | Freq: Every day | ORAL | 1 refills | Status: DC

## 2016-05-27 NOTE — Progress Notes (Signed)
Coumadin follow up  Patient is on Coumadin for PAF (paroxysmal atrial fibrillation) ([place]) [I48.0] Patient's last INR is  Lab Results  Component Value Date   INR 1.9 (H) 04/27/2016   INR 2.3 (H) 03/23/2016   INR 2.6 (H) 02/11/2016    Patient denies SOB, CP, dizziness, nose bleeds, easy bleeding, and blood in stool/urine. His coumadin dose was not changed last visit, he is on 72m every day NORMALLY but going to have cyst removal right buttocks tomorrow and has been off coumadin x 4 days. He has not taken ABX. Did have a fall 1 month ago, hit left ear, no LOC, tripped down stairs. Denies dizziness, nausea, headaches, confusion.   He also has complicated heart history with history of CABG and MAZE in 2010, failed DCCV with refractory flutter, following with Dr. BHaroldine Lawsand has done well with diueresis.  He had cellulitis or gout left leg, had recent gout flare but had recent increase to allopurinol to 2045m   Lab Results  Component Value Date   LABURIC 7.0 04/27/2016    Wt Readings from Last 5 Encounters:  05/28/16 190 lb 3.2 oz (86.3 kg)  04/27/16 190 lb 9.6 oz (86.5 kg)  03/23/16 184 lb 12.8 oz (83.8 kg)  02/11/16 190 lb 6.4 oz (86.4 kg)  01/07/16 186 lb (84.4 kg)     Current Outpatient Prescriptions on File Prior to Visit  Medication Sig Dispense Refill  . ACCU-CHEK AVIVA PLUS test strip CHECK BLOOD GLUCOSE 3 TIMES DAILY. 300 each 1  . acetaminophen (TYLENOL) 500 MG tablet Take 500-1,000 mg by mouth every 6 (six) hours as needed (pain).    . Marland Kitchenllopurinol (ZYLOPRIM) 100 MG tablet Take 100 mg by mouth daily.     . Marland KitchenLPRAZolam (XANAX) 1 MG tablet Take 0.5 tablets (0.5 mg total) by mouth at bedtime as needed for sleep. 60 tablet 0  . Alum Hydroxide-Mag Carbonate (GAVISCON PO) Take 1 tablet by mouth daily as needed (acid reflux).    . Marland Kitchentorvastatin (LIPITOR) 40 MG tablet Take 1 tablet (40 mg total) by mouth daily. 30 tablet 1  . azelastine (ASTELIN) 0.1 % nasal spray Place 2 sprays  into both nostrils 2 (two) times daily. Use in each nostril as directed 30 mL 2  . B Complex Vitamins (VITAMIN B COMPLEX PO) Take 1 tablet by mouth at bedtime.     . Marland KitchenAYER MICROLET LANCETS lancets Test once a day 100 each 3  . Blood Glucose Monitoring Suppl (ACCU-CHEK AVIVA PLUS) w/Device KIT Check blood sugar 1 time  daily 1 kit 0  . carvedilol (COREG) 6.25 MG tablet TAKE 1 TABLET (6.25 MG TOTAL) BY MOUTH 2 (TWO) TIMES DAILY. 180 tablet 3  . Cholecalciferol (VITAMIN D-3) 5000 units TABS Take 5,000 Units by mouth at bedtime.    . colchicine 0.6 MG tablet Take 1 tablet (0.6 mg total) by mouth daily. 90 tablet 1  . diphenhydrAMINE (BENADRYL) 25 MG tablet Take 25 mg by mouth at bedtime as needed for allergies.     . Dulaglutide (TRULICITY) 0.5.46GFK/8.1EXOPN Inject 1 pen into the skin once a week. 3 pen 4  . fluticasone (FLONASE) 50 MCG/ACT nasal spray Place 1 spray into both nostrils daily as needed for allergies.     . Marland Kitchenlimepiride (AMARYL) 4 MG tablet Take 1 tablet 2 times daily. 180 tablet 1  . HYDROcodone-acetaminophen (NORCO/VICODIN) 5-325 MG tablet Take 1-2 tablets by mouth every 6 (six) hours as needed for moderate pain.     .[name]  lidocaine (LIDODERM) 5 % Place 1 patch onto the skin daily as needed (pain). Remove & Discard patch within 12 hours or as directed by MD    . loratadine (CLARITIN) 10 MG tablet Take 10 mg by mouth daily.    . metFORMIN (GLUCOPHAGE-XR) 500 MG 24 hr tablet TAKE 2 TABLETS BY MOUTH TWO TIMES DAILY 360 tablet 1  . metolazone (ZAROXOLYN) 5 MG tablet Take 5 mg by mouth daily as needed (weight gain and edema). 20 tablet 1  . Multiple Vitamin (MULTIVITAMIN WITH MINERALS) TABS tablet Take 1 tablet by mouth at bedtime.     . Multiple Vitamins-Minerals (PRESERVISION AREDS 2) CAPS Take 1 capsule by mouth 2 (two) times daily.     . polyvinyl alcohol (ARTIFICIAL TEARS) 1.4 % ophthalmic solution Place 1 drop into both eyes daily as needed for dry eyes.    . potassium chloride SA  (K-DUR,KLOR-CON) 20 MEQ tablet Take 1 tablet (20 mEq total) by mouth 2 (two) times daily. 180 tablet 4  . PRESCRIPTION MEDICATION Inhale into the lungs at bedtime. CPAP    . ranitidine (ZANTAC) 150 MG tablet Take 150 mg by mouth daily as needed for heartburn.     . sertraline (ZOLOFT) 100 MG tablet TAKE 1 TABLET BY MOUTH DAILY 90 tablet 1  . torsemide (DEMADEX) 100 MG tablet Take 100 mg in AM and 50 mg in PM if needed for weight gain 180 tablet 3  . valsartan (DIOVAN) 320 MG tablet Take 160 mg by mouth daily.    [name] warfarin (COUMADIN) 5 MG tablet TAKE 1 TABLET BY MOUTH TWICE A DAY 180 tablet 3   No current facility-administered medications on file prior to visit.    Past Medical History:  Diagnosis Date  . Adrenal adenoma   . Anxiety   . Atypical atrial flutter ([place]) 8/15, 10/15   a. DCCV 08/2013. b. s/p RFA 10/2013.  [name] Basal cell carcinoma   . CAD (coronary artery disease)    a. 04/2013 CABG x 2: LIMA to LAD, SVG to RI, EVH via R thigh.  . Cellulitis 12/2015   left leg  . Chronic diastolic congestive heart failure ([place])   . CKD (chronic kidney disease), stage III   . Depression   . Diabetes mellitus type II   . Diverticulosis 2001  . DJD (degenerative joint disease)   . GERD (gastroesophageal reflux disease)   . H/O hiatal hernia   . History of cardioversion    x3 (years uncertain)  . Hx of adenomatous colonic polyps   . Hyperlipidemia   . Hypertension   . Hypertensive cardiomyopathy ([place])   . Obstructive sleep apnea    compliant with CPAP  . 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 Persistent atrial fibrillation ([place])    a. s/p MAZE 04/2013 in setting of CABG. b. Amio stopped in 10/2013 after flutter ablation.  [name] PFO (patent foramen ovale)    a. Small PFO by TEE 10/2013.  [name] Pleural effusion, left    a. s/p thoracentesis 05/2013.  [name] Respiratory failure ([place])    a.  Hypoxia 10/2013 - required supp O2 as inpatient, did not require it at discharge.  . S/P Maze operation for atrial fibrillation    a. 04/2013: Complete bilateral atrial lesion set using cryothermy and bipolar radiofrequency ablation with clipping of LA appendage (@ time of CABG)  . S/P Maze operation for atrial  fibrillation 04/05/2013   Complete bilateral atrial lesion set using cryothermy and bipolar radiofrequency ablation with clipping of LA appendage via median sternotomy approach    Allergies  Allergen Reactions  . Sunflower Seed [Sunflower Oil] Swelling and Other (See Comments)    Tongue and lip swelling  . Horse-Derived Products Other (See Comments)    Per allergy skin test  . Tetanus Toxoids Other (See Comments)    Per allergy skin test    Review of Systems  Constitutional: Positive for malaise/fatigue. Negative for chills and fever.  HENT: Negative.   Respiratory: Positive for shortness of breath (better). Negative for cough and wheezing.   Cardiovascular: Negative for chest pain, orthopnea, leg swelling (improved) and PND.  Gastrointestinal: Negative.   Genitourinary: Positive for frequency. Negative for dysuria, flank pain, hematuria and urgency.  Musculoskeletal: Negative.   Skin: Negative.   Neurological: Negative for dizziness, tingling, tremors, sensory change, speech change, focal weakness, seizures, loss of consciousness and weakness.  Psychiatric/Behavioral: Negative.  Negative for depression and suicidal ideas.     Physical: Blood pressure 124/80, pulse 75, temperature 97.7 F (36.5 C), resp. rate 14, height _0  (1.803 m), weight 190 lb 3.2 oz (86.3 kg), SpO2 96 %. Filed Weights   05/28/16 1102  Weight: 190 lb 3.2 oz (86.3 kg)    General Appearance: Well nourished, in no apparent distress. ENT/Mouth: Nares clear with no erythema, swelling, mucus on turbinates. No ulcers, cracking, on lips. No erythema, swelling, or exudate on post pharynx.  Neck: Supple,  thyroid normal.  Respiratory: lungs CTAB  Cardio:  Irregular, irregular rhythm, with holosystolic murmur without rubs or gallops. No edema  Abdomen: distended, with bowl sounds. Non tender, no guarding, rebound, hernias, masses, or organomegaly.  Skin: Warm, dry without rashes, lesions, ecchymosis.  Neuro: Unremarkable  Assessment and plan: Chronic anticoagulation- check INR and will adjust medication according to labs.  Discussed if patient falls to immediately contact office or go to ER. Discussed foods that can increase or decrease Coumadin levels. Patient understands to call the office before starting a new medication. Follow up in one month.   PAF (paroxysmal atrial fibrillation) (HCC) Rate controlled -     Protime-INR  Chronic diastolic congestive heart failure (HCC) Weight stable, continue follow up  Hypertensive cardiomyopathy, with heart failure ([place]) Weight stable, continue follow up Weight daily    Future Appointments Date Time Provider [place]  05/29/2016 11:15 AM [name], MD [facility] None  06/18/2016 10:40 AM Bensimhon, Shaune Pascal, MD [facility] None  07/01/2016 4:30 PM [name], MD [facility] None  07/16/2016 8:15 AM Hayden Pedro, MD [facility] None  04/21/2017 2:00 PM [name], MD [facility] None

## 2016-05-28 ENCOUNTER — Encounter: Payer: Self-pay | Admitting: Physician Assistant

## 2016-05-28 ENCOUNTER — Ambulatory Visit (INDEPENDENT_AMBULATORY_CARE_PROVIDER_SITE_OTHER): Payer: Medicare Other | Admitting: Physician Assistant

## 2016-05-28 VITALS — BP 124/80 | HR 75 | Temp 97.7°F | Resp 14 | Ht 71.0 in | Wt 190.2 lb

## 2016-05-28 DIAGNOSIS — I110 Hypertensive heart disease with heart failure: Secondary | ICD-10-CM | POA: Diagnosis not present

## 2016-05-28 DIAGNOSIS — Z7901 Long term (current) use of anticoagulants: Secondary | ICD-10-CM

## 2016-05-28 DIAGNOSIS — I480 Paroxysmal atrial fibrillation: Secondary | ICD-10-CM | POA: Diagnosis not present

## 2016-05-28 DIAGNOSIS — I5032 Chronic diastolic (congestive) heart failure: Secondary | ICD-10-CM | POA: Diagnosis not present

## 2016-05-28 DIAGNOSIS — I43 Cardiomyopathy in diseases classified elsewhere: Secondary | ICD-10-CM

## 2016-05-28 LAB — CBC WITH DIFFERENTIAL/PLATELET
BASOS ABS: 0 {cells}/uL (ref 0–200)
Basophils Relative: 0 %
EOS PCT: 1 %
Eosinophils Absolute: 88 cells/uL (ref 15–500)
HCT: 37.3 % — ABNORMAL LOW (ref 38.5–50.0)
HEMOGLOBIN: 12.6 g/dL — AB (ref 13.2–17.1)
LYMPHS ABS: 880 {cells}/uL (ref 850–3900)
Lymphocytes Relative: 10 %
MCH: 31.7 pg (ref 27.0–33.0)
MCHC: 33.8 g/dL (ref 32.0–36.0)
MCV: 93.7 fL (ref 80.0–100.0)
MPV: 9 fL (ref 7.5–12.5)
Monocytes Absolute: 704 cells/uL (ref 200–950)
Monocytes Relative: 8 %
NEUTROS ABS: 7128 {cells}/uL (ref 1500–7800)
Neutrophils Relative %: 81 %
Platelets: 203 10*3/uL (ref 140–400)
RBC: 3.98 MIL/uL — AB (ref 4.20–5.80)
RDW: 14.5 % (ref 11.0–15.0)
WBC: 8.8 10*3/uL (ref 3.8–10.8)

## 2016-05-29 ENCOUNTER — Ambulatory Visit (INDEPENDENT_AMBULATORY_CARE_PROVIDER_SITE_OTHER): Payer: Medicare Other | Admitting: Internal Medicine

## 2016-05-29 VITALS — BP 102/58 | HR 68 | Temp 97.5°F | Resp 16 | Ht 71.0 in | Wt 189.0 lb

## 2016-05-29 DIAGNOSIS — I10 Essential (primary) hypertension: Secondary | ICD-10-CM

## 2016-05-29 DIAGNOSIS — L0231 Cutaneous abscess of buttock: Secondary | ICD-10-CM

## 2016-05-29 LAB — PROTIME-INR
INR: 1.2 — AB
PROTHROMBIN TIME: 12.2 s — AB (ref 9.0–11.5)

## 2016-05-30 ENCOUNTER — Encounter: Payer: Self-pay | Admitting: Internal Medicine

## 2016-05-30 NOTE — Progress Notes (Addendum)
Subjective:    Patient ID: [name], male    DOB: 02/11/1940, 77 y.o.   MRN: [id number]  HPI  Very nice 76 yo MWM with multiple co-morbidities including HTN, ASHD, pAfib On Coumadin and  T2_DM presents for f/u with no c/o HA's , dizziness,CP, palpitations, orthop/PND or dep Edema. He also has c/o of several months hx/o of draining boils of his Rt buttock not resolving with several courses of antibiotics.   Medication Sig  . acetaminophen (TYLENOL) 500 MG tablet Take 500-1,000 mg by mouth every 6 (six) hours as needed (pain).  [name] allopurinol (ZYLOPRIM) 100 MG tablet Take 100 mg by mouth daily.   [name] ALPRAZolam (XANAX) 1 MG tablet Take 0.5 tablets (0.5 mg total) by mouth at bedtime as needed for sleep.  [name] Alum Hydroxide-Mag Carbonate (GAVISCON PO) Take 1 tablet by mouth daily as needed (acid reflux).  [name] atorvastatin (LIPITOR) 40 MG tablet Take 1 tablet (40 mg total) by mouth daily.  [name] azelastine (ASTELIN) 0.1 % nasal spray Place 2 sprays into both nostrils 2 (two) times daily. Use in each nostril as directed  . B Complex Vitamins (VITAMIN B COMPLEX PO) Take 1 tablet by mouth at bedtime.   . carvedilol (COREG) 6.25 MG tablet TAKE 1 TABLET (6.25 MG TOTAL) BY MOUTH 2 (TWO) TIMES DAILY.  [name] Cholecalciferol (VITAMIN D-3) 5000 units TABS Take 5,000 Units by mouth at bedtime.  . colchicine 0.6 MG tablet Take 1 tablet (0.6 mg total) by mouth daily.  . diphenhydrAMINE (BENADRYL) 25 MG tablet Take 25 mg by mouth at bedtime as needed for allergies.   . Dulaglutide (TRULICITY) 9.21 JH/4.1DE SOPN Inject 1 pen into the skin once a week.  . fluticasone (FLONASE) 50 MCG/ACT nasal spray Place 1 spray into both nostrils daily as needed for allergies.   [name] glimepiride (AMARYL) 4 MG tablet Take 1 tablet 2 times daily.  [name] HYDROcodone-acetaminophen (NORCO/VICODIN) 5-325 MG tablet Take 1-2 tablets by mouth every 6 (six) hours as needed for moderate pain.   [name] lidocaine (LIDODERM) 5 % Place 1 patch onto the skin daily as  needed (pain). Remove & Discard patch within 12 hours or as directed by MD  . loratadine (CLARITIN) 10 MG tablet Take 10 mg by mouth daily.  . metFORMIN (GLUCOPHAGE-XR) 500 MG 24 hr tablet TAKE 2 TABLETS BY MOUTH TWO TIMES DAILY  . metolazone (ZAROXOLYN) 5 MG tablet Take 5 mg by mouth daily as needed (weight gain and edema).  . Multiple Vitamin (MULTIVITAMIN WITH MINERALS) TABS tablet Take 1 tablet by mouth at bedtime.   . Multiple Vitamins-Minerals (PRESERVISION AREDS 2) CAPS Take 1 capsule by mouth 2 (two) times daily.   . polyvinyl alcohol (ARTIFICIAL TEARS) 1.4 % ophthalmic solution Place 1 drop into both eyes daily as needed for dry eyes.  . potassium chloride SA (K-DUR,KLOR-CON) 20 MEQ tablet Take 1 tablet (20 mEq total) by mouth 2 (two) times daily.  [name] PRESCRIPTION MEDICATION Inhale into the lungs at bedtime. CPAP  . ranitidine (ZANTAC) 150 MG tablet Take 150 mg by mouth daily as needed for heartburn.   . sertraline (ZOLOFT) 100 MG tablet TAKE 1 TABLET BY MOUTH DAILY  . torsemide (DEMADEX) 100 MG tablet Take 100 mg in AM and 50 mg in PM if needed for weight gain  . valsartan (DIOVAN) 320 MG tablet Take 160 mg by mouth daily.  [name] warfarin (COUMADIN) 5 MG tablet TAKE 1 TABLET BY MOUTH TWICE A DAY   Allergies  Allergen Reactions  . Sunflower Seed [Sunflower Oil] Swelling and Other (See Comments)    Tongue and lip swelling  . Horse-Derived Products Other (See Comments)    Per allergy skin test  . Tetanus Toxoids Other (See Comments)    Per allergy skin test   Past Medical History:  Diagnosis Date  . Adrenal adenoma   . Anxiety   . Atypical atrial flutter ([place]) 8/15, 10/15   a. DCCV 08/2013. b. s/p RFA 10/2013.  [name] Basal cell carcinoma   . CAD (coronary artery disease)    a. 04/2013 CABG x 2: LIMA to LAD, SVG to RI, EVH via R thigh.  . Cellulitis 12/2015   left leg  . Chronic diastolic congestive heart failure ([place])   . CKD (chronic kidney disease), stage III   . Depression   .  Diabetes mellitus type II   . Diverticulosis 2001  . DJD (degenerative joint disease)   . GERD (gastroesophageal reflux disease)   . H/O hiatal hernia   . History of cardioversion    x3 (years uncertain)  . Hx of adenomatous colonic polyps   . Hyperlipidemia   . Hypertension   . Hypertensive cardiomyopathy ([place])   . Obstructive sleep apnea    compliant with CPAP  . 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 Persistent atrial fibrillation ([place])    a. s/p MAZE 04/2013 in setting of CABG. b. Amio stopped in 10/2013 after flutter ablation.  [name] PFO (patent foramen ovale)    a. Small PFO by TEE 10/2013.  [name] Pleural effusion, left    a. s/p thoracentesis 05/2013.  [name] Respiratory failure ([place])    a. Hypoxia 10/2013 - required supp O2 as inpatient, did not require it at discharge.  . S/P Maze operation for atrial fibrillation    a. 04/2013: Complete bilateral atrial lesion set using cryothermy and bipolar radiofrequency ablation with clipping of LA appendage (@ time of CABG)  . S/P Maze operation for atrial fibrillation 04/05/2013   Complete bilateral atrial lesion set using cryothermy and bipolar radiofrequency ablation with clipping of LA appendage via median sternotomy approach    Past Surgical History:  Procedure Laterality Date  . ATRIAL FIBRILLATION ABLATION N/A 10/26/2013   Procedure: ATRIAL FIBRILLATION ABLATION;  Surgeon: Coralyn Mark, MD;  Location: [facility] CATH LAB;  Service: Cardiovascular;  Laterality: N/A;  . BASAL CELL CARCINOMA EXCISION     x3 on face  . CARDIAC CATHETERIZATION     myocardial bridge but no cad  . CARDIOVERSION N/A 08/23/2013   Procedure: CARDIOVERSION;  Surgeon: Sanda Klein, MD;  Location: [facility];  Service: Cardiovascular;  Laterality: N/A;  . CARPOMETACARPEL SUSPENSION PLASTY Left 02/14/2014   Procedure: CARPOMETACARPEL ([place]) SUSPENSIONPLASTY THUMB  WITH   ABDUCTOR POLLICIS LONGUS TRANSFER AND STENOSING TENOSYNOVITIS RELEASE LEFT WRIST;  Surgeon: Charlotte Crumb, MD;  Location: [facility];  Service: Orthopedics;  Laterality: Left;  . CATARACT EXTRACTION     bilateral  . CORONARY ARTERY BYPASS GRAFT N/A 04/05/2013   Procedure: CORONARY ARTERY BYPASS GRAFTING (CABG) TIMES TWO USING LEFT INTERNAL MAMMARY ARTERY AND RIGHT SAPHENOUS LEG VEIN HARVESTED ENDOSCOPICALLY;  Surgeon: Rexene Alberts, MD;  Location: [facility];  Service: Open Heart Surgery;  Laterality: N/A;  . GREAT TOE ARTHRODESIS, INTERPHALANGEAL JOINT     Right foot  . INTRAOPERATIVE TRANSESOPHAGEAL ECHOCARDIOGRAM N/A 04/05/2013   Procedure: INTRAOPERATIVE TRANSESOPHAGEAL ECHOCARDIOGRAM;  Surgeon:  Rexene Alberts, MD;  Location: [facility];  Service: Open Heart Surgery;  Laterality: N/A;  . LEFT HEART CATHETERIZATION WITH CORONARY ANGIOGRAM N/A 03/07/2013   Procedure: LEFT HEART CATHETERIZATION WITH CORONARY ANGIOGRAM;  Surgeon: Burnell Blanks, MD;  Location: [facility] CATH LAB;  Service: Cardiovascular;  Laterality: N/A;  . MAZE N/A 04/05/2013   Procedure: MAZE;  Surgeon: Rexene Alberts, MD;  Location: [facility];  Service: Open Heart Surgery;  Laterality: N/A;  . Polinydal cyst     Removed  . POLYPECTOMY    . Retina repair-right    . RIGHT HEART CATHETERIZATION N/A 05/03/2014   Procedure: RIGHT HEART CATH;  Surgeon: Jolaine Artist, MD;  Location: [facility] CATH LAB;  Service: Cardiovascular;  Laterality: N/A;  . TEE WITHOUT CARDIOVERSION N/A 08/23/2013   Procedure: TRANSESOPHAGEAL ECHOCARDIOGRAM (TEE);  Surgeon: Sanda Klein, MD;  Location: [facility] ENDOSCOPY;  Service: Cardiovascular;  Laterality: N/A;  . TEE WITHOUT CARDIOVERSION N/A 10/26/2013   Procedure: TRANSESOPHAGEAL ECHOCARDIOGRAM (TEE);  Surgeon: Sueanne Margarita, MD;  Location: [facility] ENDOSCOPY;  Service: Cardiovascular;  Laterality: N/A;  . TEE WITHOUT CARDIOVERSION N/A 06/05/2014   Procedure: TRANSESOPHAGEAL ECHOCARDIOGRAM (TEE);  Surgeon:  Thayer Headings, MD;  Location: [facility] ENDOSCOPY;  Service: Cardiovascular;  Laterality: N/A;  . TRAPEZIUM RESECTION     Review of Systems  10 point systems review negative except as above.    Objective:   Physical Exam  BP (!) 102/58   Pulse 68   Temp 97.5 F (36.4 C)   Resp 16   Ht 5\' 11"  (1.803 m)   Wt 189 lb (85.7 kg)   BMI 26.36 kg/m   HEENT - WNL. Neck - supple.  Chest - Clear equal BS. Cor - Nl HS. RRR w/o sig MGR. PP 1(+). No edema. MS- FROM w/o deformities.  Gait Nl. Neuro -  Nl w/o focal abnormalities. Shin- There is  A tender indurated area of the medial mid Rt buttock with fer open sinus tracts.   Procedure (CPT- 70929) After informed consent and aseptic prep with alcohol and local anesthesia with 6 ml of Marcaine 0.5% infiltrated into the indurated area. Then with a #10 scalpel the area of induration measuring 2 cm x 4 cm was sharply excised full thickness to healthy fat tissue in an elliptical fashion.the removed tissue block appeared to be multiple loculated abscesses and was discarded. Then with 3-0 Nylon # 4 vertical mattress sutures were placed to collapse the wound cavity and approximate the wound edges. Then # 8 interrupted sutures of Nylon 3-0 were placed to align and evert the opposing wound edges producing a 6 cm vertically oriented wound. 2" x # " Tegaderm was applied.     Assessment & Plan:   1. Essential hypertension   2. Abscess of buttock, right  - excised in toto (Procedure: CPT -10061)

## 2016-06-03 ENCOUNTER — Ambulatory Visit: Payer: Medicare Other | Admitting: Internal Medicine

## 2016-06-03 ENCOUNTER — Encounter: Payer: Self-pay | Admitting: Internal Medicine

## 2016-06-03 VITALS — BP 110/64 | HR 80 | Temp 97.0°F | Resp 16 | Ht 71.0 in | Wt 189.0 lb

## 2016-06-03 DIAGNOSIS — L0231 Cutaneous abscess of buttock: Secondary | ICD-10-CM

## 2016-06-03 NOTE — Progress Notes (Signed)
   Patient returns 5 days post excision of a loculated multiloculated  Infected sebaceous cyst of the Rt buttock. Denies problems, drainage or bleeding.   Wound appears healing well w/o signs of infection.  # 6 vertical mattress sutures removed and healing ridge appears intact.  ROV in 5-6 days for final #6 suture removal

## 2016-06-08 ENCOUNTER — Ambulatory Visit: Payer: Medicare Other | Admitting: Internal Medicine

## 2016-06-08 VITALS — BP 106/60 | HR 80 | Temp 97.0°F | Resp 18 | Ht 71.0 in | Wt 191.0 lb

## 2016-06-08 DIAGNOSIS — L0231 Cutaneous abscess of buttock: Secondary | ICD-10-CM

## 2016-06-08 MED ORDER — DOXYCYCLINE HYCLATE 100 MG PO CAPS: ORAL_CAPSULE | ORAL | 0 refills | Status: DC

## 2016-06-08 NOTE — Progress Notes (Signed)
Patient returns for 2sd wound re-check s/p excision (05/29/2016)  of a multi-loculated cyst of the R buttock.   BP 106/60   Pulse 80   Temp 97 F (36.1 C)   Resp 18   Ht 5\' 11"  (1.803 m)   Wt 191 lb (86.6 kg)   BMI 26.64 kg/m   Healing ridge not competent and only #1 stitch removed .  5 sutures remain.  1 " lateral to op site appears a small pustule.  Advised "hot" tub soaks daily and dry dressing.  ROV - Fri - in 5 days to re-check.   Rx- Doxycycline

## 2016-06-10 ENCOUNTER — Other Ambulatory Visit: Payer: Self-pay | Admitting: Physician Assistant

## 2016-06-10 ENCOUNTER — Other Ambulatory Visit: Payer: Self-pay | Admitting: Internal Medicine

## 2016-06-10 DIAGNOSIS — M109 Gout, unspecified: Secondary | ICD-10-CM | POA: Diagnosis not present

## 2016-06-10 DIAGNOSIS — M1990 Unspecified osteoarthritis, unspecified site: Secondary | ICD-10-CM | POA: Diagnosis not present

## 2016-06-10 DIAGNOSIS — N189 Chronic kidney disease, unspecified: Secondary | ICD-10-CM | POA: Diagnosis not present

## 2016-06-10 DIAGNOSIS — E790 Hyperuricemia without signs of inflammatory arthritis and tophaceous disease: Secondary | ICD-10-CM | POA: Diagnosis not present

## 2016-06-10 DIAGNOSIS — I5032 Chronic diastolic (congestive) heart failure: Secondary | ICD-10-CM

## 2016-06-12 ENCOUNTER — Ambulatory Visit: Payer: Medicare Other | Admitting: Internal Medicine

## 2016-06-12 VITALS — BP 114/62 | HR 72 | Temp 97.9°F | Resp 16 | Ht 71.0 in | Wt 191.0 lb

## 2016-06-12 DIAGNOSIS — L0231 Cutaneous abscess of buttock: Secondary | ICD-10-CM

## 2016-06-12 NOTE — Progress Notes (Signed)
    Sutures removed with apparent adequate healing ridge with a central area of separation approx 3 mm appearing clean & granulated and palpable "thickening" in the sub-cut under  the 5 cm vertical scar. No changes to suggest cellulitis    Advise warm/hot tub soaks 1-2 x / day and also to refill & stay on the Doxycycline  til  F/U quarterly OV in about 10 days.

## 2016-06-15 ENCOUNTER — Other Ambulatory Visit: Payer: Self-pay | Admitting: Internal Medicine

## 2016-06-15 DIAGNOSIS — L03317 Cellulitis of buttock: Secondary | ICD-10-CM

## 2016-06-15 MED ORDER — DOXYCYCLINE HYCLATE 100 MG PO CAPS: ORAL_CAPSULE | ORAL | 1 refills | Status: DC

## 2016-06-18 ENCOUNTER — Encounter (HOSPITAL_COMMUNITY): Payer: Self-pay | Admitting: Internal Medicine

## 2016-06-18 ENCOUNTER — Ambulatory Visit (HOSPITAL_COMMUNITY)
Admission: RE | Admit: 2016-06-18 | Discharge: 2016-06-18 | Disposition: A | Discharge status: Home / Self Care | Payer: Medicare Other | Source: Ambulatory Visit | Attending: Internal Medicine | Admitting: Internal Medicine

## 2016-06-18 VITALS — BP 110/64 | HR 73 | Wt 187.8 lb

## 2016-06-18 DIAGNOSIS — I272 Pulmonary hypertension, unspecified: Secondary | ICD-10-CM | POA: Insufficient documentation

## 2016-06-18 DIAGNOSIS — F419 Anxiety disorder, unspecified: Secondary | ICD-10-CM | POA: Diagnosis not present

## 2016-06-18 DIAGNOSIS — F329 Major depressive disorder, single episode, unspecified: Secondary | ICD-10-CM | POA: Insufficient documentation

## 2016-06-18 DIAGNOSIS — N183 Chronic kidney disease, stage 3 unspecified: Secondary | ICD-10-CM

## 2016-06-18 DIAGNOSIS — G4733 Obstructive sleep apnea (adult) (pediatric): Secondary | ICD-10-CM | POA: Diagnosis not present

## 2016-06-18 DIAGNOSIS — Z9889 Other specified postprocedural states: Secondary | ICD-10-CM | POA: Insufficient documentation

## 2016-06-18 DIAGNOSIS — J449 Chronic obstructive pulmonary disease, unspecified: Secondary | ICD-10-CM | POA: Insufficient documentation

## 2016-06-18 DIAGNOSIS — E1122 Type 2 diabetes mellitus with diabetic chronic kidney disease: Secondary | ICD-10-CM | POA: Diagnosis not present

## 2016-06-18 DIAGNOSIS — I509 Heart failure, unspecified: Secondary | ICD-10-CM

## 2016-06-18 DIAGNOSIS — I5032 Chronic diastolic (congestive) heart failure: Secondary | ICD-10-CM | POA: Insufficient documentation

## 2016-06-18 DIAGNOSIS — M109 Gout, unspecified: Secondary | ICD-10-CM | POA: Diagnosis not present

## 2016-06-18 DIAGNOSIS — I480 Paroxysmal atrial fibrillation: Secondary | ICD-10-CM | POA: Insufficient documentation

## 2016-06-18 DIAGNOSIS — Z951 Presence of aortocoronary bypass graft: Secondary | ICD-10-CM | POA: Insufficient documentation

## 2016-06-18 DIAGNOSIS — M1990 Unspecified osteoarthritis, unspecified site: Secondary | ICD-10-CM | POA: Diagnosis not present

## 2016-06-18 DIAGNOSIS — I50810 Right heart failure, unspecified: Secondary | ICD-10-CM

## 2016-06-18 DIAGNOSIS — Z7984 Long term (current) use of oral hypoglycemic drugs: Secondary | ICD-10-CM | POA: Diagnosis not present

## 2016-06-18 DIAGNOSIS — I130 Hypertensive heart and chronic kidney disease with heart failure and stage 1 through stage 4 chronic kidney disease, or unspecified chronic kidney disease: Secondary | ICD-10-CM | POA: Insufficient documentation

## 2016-06-18 DIAGNOSIS — K219 Gastro-esophageal reflux disease without esophagitis: Secondary | ICD-10-CM | POA: Diagnosis not present

## 2016-06-18 DIAGNOSIS — Z85828 Personal history of other malignant neoplasm of skin: Secondary | ICD-10-CM | POA: Insufficient documentation

## 2016-06-18 DIAGNOSIS — Z7901 Long term (current) use of anticoagulants: Secondary | ICD-10-CM | POA: Insufficient documentation

## 2016-06-18 DIAGNOSIS — I2510 Atherosclerotic heart disease of native coronary artery without angina pectoris: Secondary | ICD-10-CM | POA: Insufficient documentation

## 2016-06-18 DIAGNOSIS — K5790 Diverticulosis of intestine, part unspecified, without perforation or abscess without bleeding: Secondary | ICD-10-CM | POA: Diagnosis not present

## 2016-06-18 DIAGNOSIS — Z8371 Family history of colonic polyps: Secondary | ICD-10-CM | POA: Diagnosis not present

## 2016-06-18 DIAGNOSIS — Z87891 Personal history of nicotine dependence: Secondary | ICD-10-CM | POA: Insufficient documentation

## 2016-06-18 DIAGNOSIS — E785 Hyperlipidemia, unspecified: Secondary | ICD-10-CM | POA: Diagnosis not present

## 2016-06-18 DIAGNOSIS — I484 Atypical atrial flutter: Secondary | ICD-10-CM | POA: Insufficient documentation

## 2016-06-18 DIAGNOSIS — Q263 Partial anomalous pulmonary venous connection: Secondary | ICD-10-CM

## 2016-06-18 LAB — BASIC METABOLIC PANEL
ANION GAP: 10 (ref 5–15)
BUN: 49 mg/dL — ABNORMAL HIGH (ref 6–20)
CALCIUM: 9 mg/dL (ref 8.9–10.3)
CO2: 29 mmol/L (ref 22–32)
Chloride: 98 mmol/L — ABNORMAL LOW (ref 101–111)
Creatinine, Ser: 2.18 mg/dL — ABNORMAL HIGH (ref 0.61–1.24)
GFR calc non Af Amer: 27 mL/min — ABNORMAL LOW (ref >60)
GFR, EST AFRICAN AMERICAN: 32 mL/min — AB (ref >60)
Glucose, Bld: 473 mg/dL — ABNORMAL HIGH (ref 65–99)
POTASSIUM: 3.6 mmol/L (ref 3.5–5.1)
Sodium: 137 mmol/L (ref 135–145)

## 2016-06-18 NOTE — Progress Notes (Signed)
Advanced Heart Failure Medication Review by a Pharmacist  Does the patient  feel that his/her medications are working for him/her?  yes  Has the patient been experiencing any side effects to the medications prescribed?  no  Does the patient measure his/her own blood pressure or blood glucose at home?  yes   Does the patient have any problems obtaining medications due to transportation or finances?   no  Understanding of regimen: excellent Understanding of indications: excellent Potential of compliance: excellent Patient understands to avoid NSAIDs. Patient understands to avoid decongestants.  Issues to address at subsequent visits: none   Pharmacist comments: [name] is a pleasant 76 yo male presenting without medications bottles but has an excellent understanding of medication regimens. 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 Per patient, only has ~4-5 days left on doxycycline. Added bacitracin - using PRN for scrapes. No other issues.   Carlean Jews, Pharm.D. PGY1 Pharmacy Resident 6/14/201811:06 AM Pager [phone]   Time with patient: 10 mins Preparation and documentation time: 3 mins Total time: 13 mins

## 2016-06-18 NOTE — Addendum Note (Signed)
Encounter addended by: Carlean Jews, [facility] on: 06/18/2016 11:07 AM<BR>    Actions taken: Order list changed, Medication note saved, Medication taking status modified, Reconcile Outside Information medication data discarded, Order Reconciliation Section accessed, Home Medications modified, Sign clinical note

## 2016-06-18 NOTE — Addendum Note (Signed)
Encounter addended by: Scarlette Calico, RN on: 06/18/2016 10:53 AM<BR>    Actions taken: Visit diagnoses modified, Order list changed, Diagnosis association updated, Sign clinical note

## 2016-06-18 NOTE — Progress Notes (Signed)
Patient ID: [name], male   DOB: 07/09/1940, 76 y.o.   MRN: [id number]   Advanced Heart Failure Clinic Note   Patient ID: [name], male   DOB: 1940-11-02, 76 y.o.   MRN: [id number]  Primary Cardiologist: [name]   Subjective:  [name] is a 76 y/o male with COPD , DM, PAF, 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, CKD, AFL s/p ablation in 10/15. 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 Had surgery in 4/15. Did well for awhile went to cardiac rehab and was feeling fine. Could do almost anything he wanted to do. Developed AFL in 10/15 and underwent RFA. Felt good.   In 3/16 began to develop severe SOB. Started O2. Says his symptoms got worse almost overnight. 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 He has seen Dr. Roxy Manns but felt to have no optimal surgical options for repair. His case was also presented to Dr. Michaelle Birks at [facility] who agreed that there was no way to baffle or reroute the anomalous PV flow to the LA. He had a TEE which showed LVEF 60-65% with a dilated right side and a small PFO. He has also been seen by Dr. Lake Bells who performed PFTs that showed significant restrictive lung disease with a low DLCO. He had f/u with Dr. Gilles Chiquito in the [facility] who felt his symptoms were multifactorial. Unclear why symptom onset so quick   Admitted 12/17 for gout and LE cellulitis   He presents today for regular follow-up. Feels good. Remains active. [facility] but has taken a few weeks off due to hip pain. Fluid status stable on torsemide. No orthopnea or PND. Takes a metolazone or extra torsemide about 1x/month. Weight at home 182-186. No bleeding on warfarin   Echo 12/17 LVEF 55-60% RV moderately dilated mild HK. RVSP 41mHG   Labs 11/21/14 K 3.8, Cr 1.41 Labs  12/05/14  K 4.0 1.15 Labs  01/08/15  K 4.0 1.36  Labs  7/17 K 3.9 Cr  1.6 Labs 9/17 K 3.5 cr 2.04   PFTs (7/16) FEV1 1.45 L (45%) FVC 1.77 L (40%) DLCO 46%  RHC 4/16 RA = 18 RV = 72/4/17 PA = 76/27 (46) PCW = 21 Fick cardiac output/index (using PA sat) = 9.2/4.45 Thermo CO/CI = 10.0/4.87 PVR = 2.2 WU Fick cardiac output/index (using high SVC sat) = 5.2/2.5 Pulse-ox saturation = 89%  High SVC sat = 54% Low SVC sat = 81% (at SVC/RA junction) RA sat = 68% RV sat = 66% PA sat = 68%, 69% IVC sat =56%   ECGO 1/17: EF 60%. RV dilated with moderately reduced systolic function. RVSP 70. + bubble.  VQ/CT negative for PE   TEE 10/15 small PFO  Ab u/s 6/16 liver normal + ascites. Medico renal kidney disease.  Past Medical History:  Diagnosis Date  . Adrenal adenoma   . Anxiety   . [place]   a. DCCV 08/2013. b. s/p RFA 10/2013.  .Marland KitchenBasal cell carcinoma   . CAD (coronary artery disease)    a. 04/2013 CABG x 2: LIMA to LAD, SVG to RI, EVH via R thigh.  . Cellulitis 12/2015   left leg  . [place] diastolic [place]ive heart [place]   . CKD (chronic kidney disease), stage III   .  Depression   . Diabetes mellitus type II   . Diverticulosis 2001  . DJD (degenerative joint disease)   . GERD (gastroesophageal reflux disease)   . H/O hiatal hernia   . History of cardioversion    x3 (years uncertain)  . Hx of adenomatous colonic polyps   . Hyperlipidemia   . Hypertension   . Hypertensive cardiomyopathy ([place])   . Obstructive sleep apnea    compliant with CPAP  . 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 Persistent atrial fibrillation ([place])    a. s/p MAZE 04/2013 in setting of CABG. b. Amio stopped in 10/2013 after flutter ablation.  [name] PFO (patent foramen ovale)    a. Small PFO by TEE 10/2013.  [name] Pleural effusion, left    a. s/p thoracentesis 05/2013.  [name] Respiratory failure ([place])    a. Hypoxia  10/2013 - required supp O2 as inpatient, did not require it at discharge.  . S/P Maze operation for atrial fibrillation    a. 04/2013: Complete bilateral atrial lesion set using cryothermy and bipolar radiofrequency ablation with clipping of LA appendage (@ time of CABG)  . S/P Maze operation for atrial fibrillation 04/05/2013   Complete bilateral atrial lesion set using cryothermy and bipolar radiofrequency ablation with clipping of LA appendage via median sternotomy approach     Current Outpatient Prescriptions  Medication Sig Dispense Refill  . ACCU-CHEK AVIVA PLUS test strip CHECK BLOOD GLUCOSE 3 TIMES DAILY. 300 each 1  . acetaminophen (TYLENOL) 500 MG tablet Take 500-1,000 mg by mouth every 6 (six) hours as needed (pain).    [name] allopurinol (ZYLOPRIM) 100 MG tablet Take 100 mg by mouth daily. Takes 2.5 tablets daily    . ALPRAZolam (XANAX) 1 MG tablet Take 0.5 tablets (0.5 mg total) by mouth at bedtime as needed for sleep. 60 tablet 0  . Alum Hydroxide-Mag Carbonate (GAVISCON PO) Take 1 tablet by mouth daily as needed (acid reflux).    [name] atorvastatin (LIPITOR) 40 MG tablet Take 1 tablet (40 mg total) by mouth daily. 30 tablet 1  . azelastine (ASTELIN) 0.1 % nasal spray Place 2 sprays into both nostrils 2 (two) times daily. Use in each nostril as directed 30 mL 2  . B Complex Vitamins (VITAMIN B COMPLEX PO) Take 1 tablet by mouth at bedtime.     [name] BAYER MICROLET LANCETS lancets Test once a day 100 each 3  . Blood Glucose Monitoring Suppl (ACCU-CHEK AVIVA PLUS) w/Device KIT Check blood sugar 1 time  daily 1 kit 0  . carvedilol (COREG) 6.25 MG tablet TAKE 1 TABLET (6.25 MG TOTAL) BY MOUTH 2 (TWO) TIMES DAILY. 180 tablet 3  . Cholecalciferol (VITAMIN D-3) 5000 units TABS Take 5,000 Units by mouth at bedtime.    . colchicine 0.6 MG tablet Take 1 tablet (0.6 mg total) by mouth daily. (Patient taking differently: Take 0.6 mg by mouth daily. Takes every other day.) 90 tablet 1  . diphenhydrAMINE  (BENADRYL) 25 MG tablet Take 25 mg by mouth at bedtime as needed for allergies.     [name] doxycycline (VIBRAMYCIN) 100 MG capsule Take 1 capsule 2 x / day with food for infection 30 capsule 1  . Dulaglutide (TRULICITY) 6.50 PT/4.6FK SOPN Inject 1 pen into the skin once a week. 3 pen 4  . fluticasone (FLONASE) 50 MCG/ACT nasal spray Place 1 spray into both nostrils daily as needed for  allergies.     [name] glimepiride (AMARYL) 4 MG tablet Take 1 tablet 2 times daily. 180 tablet 1  . HYDROcodone-acetaminophen (NORCO/VICODIN) 5-325 MG tablet Take 1-2 tablets by mouth every 6 (six) hours as needed for moderate pain.     [name] lidocaine (LIDODERM) 5 % Place 1 patch onto the skin daily as needed (pain). Remove & Discard patch within 12 hours or as directed by MD    . loratadine (CLARITIN) 10 MG tablet Take 10 mg by mouth daily.    . metFORMIN (GLUCOPHAGE-XR) 500 MG 24 hr tablet TAKE 2 TABLETS BY MOUTH TWO TIMES DAILY 360 tablet 1  . metolazone (ZAROXOLYN) 5 MG tablet Take 5 mg by mouth daily as needed (weight gain and edema). 20 tablet 1  . Multiple Vitamin (MULTIVITAMIN WITH MINERALS) TABS tablet Take 1 tablet by mouth at bedtime.     . Multiple Vitamins-Minerals (PRESERVISION AREDS 2) CAPS Take 1 capsule by mouth 2 (two) times daily.     . polyvinyl alcohol (ARTIFICIAL TEARS) 1.4 % ophthalmic solution Place 1 drop into both eyes daily as needed for dry eyes.    . potassium chloride SA (K-DUR,KLOR-CON) 20 MEQ tablet Take 1 tablet (20 mEq total) by mouth 2 (two) times daily. 180 tablet 4  . potassium chloride SA (K-DUR,KLOR-CON) 20 MEQ tablet TAKE 1 TABLET BY MOUTH TWO  TIMES DAILY 180 tablet 0  . PRESCRIPTION MEDICATION Inhale into the lungs at bedtime. CPAP    . ranitidine (ZANTAC) 150 MG tablet Take 150 mg by mouth daily as needed for heartburn.     . sertraline (ZOLOFT) 100 MG tablet TAKE 1 TABLET BY MOUTH DAILY 90 tablet 1  . torsemide (DEMADEX) 100 MG tablet Take 100 mg in AM and 50 mg in PM if needed for  weight gain 180 tablet 3  . valsartan (DIOVAN) 320 MG tablet Take 160 mg by mouth daily.    [name] warfarin (COUMADIN) 5 MG tablet TAKE 1 TABLET BY MOUTH TWICE A DAY 180 tablet 3   No current facility-administered medications for this encounter.     Allergies  Allergen Reactions  . Sunflower Seed [Sunflower Oil] Swelling and Other (See Comments)    Tongue and lip swelling  . Horse-Derived Products Other (See Comments)    Per allergy skin test  . Tetanus Toxoids Other (See Comments)    Per allergy skin test      Social History   Social History  . Marital status: Married    Spouse name: N/A  . Number of children: 1  . Years of education: N/A   Occupational History  . retired Software engineer    Social History Main Topics  . Smoking status: Former Smoker    Packs/day: 4.00    Years: 25.00    Types: Cigarettes    Quit date: 01/05/1981  . Smokeless tobacco: Never Used  . Alcohol use 0.6 oz/week    1 Shots of liquor per week     Comment: 1-5 drinks per week  . Drug use: No  . Sexual activity: Not on file   Other Topics Concern  . Not on file   Social History Narrative   Daily caffeine-yes   Patient gets regular exercise.   Pt lives in [place] with spouse.  Retired Software engineer.  Family History  Problem Relation Age of Onset  . Dementia Father   . Colon cancer Mother        Family History/Uncle   . Colon polyps Mother        Family History  . Atrial fibrillation Mother   . Hypertension Mother   . Colon polyps Sister        Family history  . Diabetes Maternal Uncle   . Stroke Paternal Uncle     Vitals:   06/18/16 1041  BP: 110/64  Pulse: 73  SpO2: 98%  Weight: 187 lb 12.8 oz (85.2 kg)   Wt Readings from Last 3 Encounters:  06/18/16 187 lb 12.8 oz (85.2 kg)  06/12/16 191 lb (86.6 kg)  06/08/16 191 lb (86.6 kg)     PHYSICAL EXAM: General:  Well appearing. No resp difficulty HEENT: normal Neck: supple. no JVD. Carotids 2+ bilat; no bruits. No lymphadenopathy or thryomegaly appreciated. Cor: PMI nondisplaced. Regular rate & rhythm. No rubs, gallop. 2/6 TR Lungs: clear Abdomen: soft, nontender, nondistended. No hepatosplenomegaly. No bruits or masses. Good bowel sounds. Extremities: no cyanosis, clubbing, rash, edema Neuro: alert & orientedx3, cranial nerves grossly intact. moves all 4 extremities w/o difficulty. Affect pleasant  ASSESSMENT  1. 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 RV failure 3. Pulmonary HTN 4. 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 DM2 6. CAD s/p CABG 7. AF s/p Maze   --Now NSR. on coumadin 8. AFL s/p ablation 10/15 9. CKD, stage 3, baseline creatinine 1.7-1.8 10. HTN  PLAN  Looks very good. NYHA I-II. Volume status stable. Continue sliding-scale diuretic regimen. Check labs today. Echo from 1/17show PA  pressures down 50% from previous. RV dilation expected due to high R-side flow from anomalous PV. Will continue current regimen. Remains on coumadin for PAF has tried Eliquis in past but had too much bleeding. Tolerating coumadin well.  Encouraged to remain active.    Cory Veals,MD 10:43 AM

## 2016-06-18 NOTE — Patient Instructions (Signed)
Lab today  We will contact you in 6 months to schedule your next appointment.  

## 2016-06-24 DIAGNOSIS — G4733 Obstructive sleep apnea (adult) (pediatric): Secondary | ICD-10-CM | POA: Diagnosis not present

## 2016-07-01 ENCOUNTER — Other Ambulatory Visit: Payer: Self-pay | Admitting: *Deleted

## 2016-07-01 ENCOUNTER — Encounter: Payer: Self-pay | Admitting: Internal Medicine

## 2016-07-01 ENCOUNTER — Ambulatory Visit (INDEPENDENT_AMBULATORY_CARE_PROVIDER_SITE_OTHER): Payer: Medicare Other | Admitting: Internal Medicine

## 2016-07-01 VITALS — BP 120/74 | HR 81 | Temp 97.5°F | Resp 16 | Ht 71.0 in | Wt 188.8 lb

## 2016-07-01 DIAGNOSIS — I10 Essential (primary) hypertension: Secondary | ICD-10-CM | POA: Diagnosis not present

## 2016-07-01 DIAGNOSIS — M109 Gout, unspecified: Secondary | ICD-10-CM | POA: Diagnosis not present

## 2016-07-01 DIAGNOSIS — E782 Mixed hyperlipidemia: Secondary | ICD-10-CM

## 2016-07-01 DIAGNOSIS — Z7901 Long term (current) use of anticoagulants: Secondary | ICD-10-CM

## 2016-07-01 DIAGNOSIS — N183 Chronic kidney disease, stage 3 (moderate): Principal | ICD-10-CM

## 2016-07-01 DIAGNOSIS — Z79899 Other long term (current) drug therapy: Secondary | ICD-10-CM

## 2016-07-01 DIAGNOSIS — E559 Vitamin D deficiency, unspecified: Secondary | ICD-10-CM

## 2016-07-01 DIAGNOSIS — E1122 Type 2 diabetes mellitus with diabetic chronic kidney disease: Secondary | ICD-10-CM | POA: Diagnosis not present

## 2016-07-01 DIAGNOSIS — Z0001 Encounter for general adult medical examination with abnormal findings: Secondary | ICD-10-CM

## 2016-07-01 DIAGNOSIS — I480 Paroxysmal atrial fibrillation: Secondary | ICD-10-CM

## 2016-07-01 LAB — CBC WITH DIFFERENTIAL/PLATELET
BASOS ABS: 0 {cells}/uL (ref 0–200)
Basophils Relative: 0 %
EOS ABS: 88 {cells}/uL (ref 15–500)
EOS PCT: 1 %
HEMATOCRIT: 36.1 % — AB (ref 38.5–50.0)
HEMOGLOBIN: 11.9 g/dL — AB (ref 13.2–17.1)
LYMPHS ABS: 1056 {cells}/uL (ref 850–3900)
Lymphocytes Relative: 12 %
MCH: 31.5 pg (ref 27.0–33.0)
MCHC: 33 g/dL (ref 32.0–36.0)
MCV: 95.5 fL (ref 80.0–100.0)
MPV: 9 fL (ref 7.5–12.5)
Monocytes Absolute: 440 cells/uL (ref 200–950)
Monocytes Relative: 5 %
NEUTROS ABS: 7216 {cells}/uL (ref 1500–7800)
NEUTROS PCT: 82 %
Platelets: 174 10*3/uL (ref 140–400)
RBC: 3.78 MIL/uL — ABNORMAL LOW (ref 4.20–5.80)
RDW: 14.4 % (ref 11.0–15.0)
WBC: 8.8 10*3/uL (ref 3.8–10.8)

## 2016-07-01 MED ORDER — GLUCOSE BLOOD VI STRP: ORAL_STRIP | 1 refills | Status: DC

## 2016-07-01 NOTE — Patient Instructions (Signed)

## 2016-07-01 NOTE — Progress Notes (Signed)
This very nice 76 y.o. 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 Also, he's on CPAP for OSA.      Patient is treated for HTN (1980) & BP has been controlled at home. Today's BP 120/74.  Patient has ASCAD s/p CABG & MAZE 33825. He's also s/p RFA for pAfib and he's followed for chronic diastolic HF  in [facility] by Dr Missy Sabins.  Patient has had no complaints of any cardiac type chest pain, palpitations, dyspnea/orthopnea/PND, dizziness, claudication, or dependent edema. He's on coumadin for his Afib and has had no untoward events. He has recent;y been on Doxycycline for a slowly healig bittock acscess and has tapered his coumadin to 1/2 dose.      Hyperlipidemia is controlled with diet & meds. Patient denies myalgias or other med SE's. Last Lipids were at goal albeit elevated Trig's: Lab Results  Component Value Date   CHOL 148 03/23/2016   HDL 26 (L) 03/23/2016   LDLCALC 63 03/23/2016   TRIG 293 (H) 03/23/2016   CHOLHDL 5.7 (H) 03/23/2016      Also, the patient has history of T2_NIDDM (1995) w/CKD3 (GFR 32 ml/min) and has had no symptoms of reactive hypoglycemia, diabetic polys, paresthesias or visual blurring.  Last A1c was not at goal:  Lab Results  Component Value Date   HGBA1C 7.6 (H) 03/23/2016      Further, the patient also has history of Vitamin D Deficiency("39" in 2008)  and supplements vitamin D without any suspected side-effects. Last vitamin D was at goal:  Lab Results  Component Value Date   VD25OH 65 03/23/2016   Current Outpatient Prescriptions on File Prior to Visit  Medication Sig  . acetaminophen (TYLENOL) 500 MG tablet Take 500-1,000 mg by mouth every 6 (six) hours as needed (pain).  [name] allopurinol (ZYLOPRIM) 100 MG tablet Take 100 mg by mouth daily. Takes 2.5 tablets daily  . ALPRAZolam (XANAX) 1 MG tablet Take 0.5 tablets (0.5 mg total) by mouth at bedtime as needed for sleep.    [name] Alum Hydroxide-Mag Carbonate (GAVISCON PO) Take 1 tablet by mouth daily as needed (acid reflux).  [name] atorvastatin (LIPITOR) 40 MG tablet Take 1 tablet (40 mg total) by mouth daily.  [name] azelastine (ASTELIN) 0.1 % nasal spray Place 2 sprays into both nostrils 2 (two) times daily. Use in each nostril as directed  . B Complex Vitamins (VITAMIN B COMPLEX PO) Take 1 tablet by mouth at bedtime.   . bacitracin 500 UNIT/GM ointment Apply 1 application topically daily as needed for wound care.  . Blood Glucose Monitoring Suppl (ACCU-CHEK AVIVA PLUS) w/Device KIT Check blood sugar 1 time  daily  . carvedilol (COREG) 6.25 MG tablet TAKE 1 TABLET (6.25 MG TOTAL) BY MOUTH 2 (TWO) TIMES DAILY.  [name] Cholecalciferol (VITAMIN D-3) 5000 units TABS Take 5,000 Units by mouth at bedtime.  . diphenhydrAMINE (BENADRYL) 25 MG tablet Take 25 mg by mouth at bedtime as needed for allergies.   [name] doxycycline (DORYX) 100 MG EC tablet Take 100 mg by mouth daily.  . Dulaglutide (TRULICITY) 0.53 ZJ/6.7HA SOPN Inject 1 pen into the skin once a week.  . fluticasone (FLONASE) 50 MCG/ACT nasal spray Place 1 spray into both nostrils daily as needed for allergies.   [name] glimepiride (AMARYL) 4 MG tablet Take 1 tablet 2 times daily.  [name] HYDROcodone-acetaminophen (NORCO/VICODIN) 5-325 MG tablet Take 1-2 tablets by mouth every 6 (  six) hours as needed for moderate pain.   [name] lidocaine (LIDODERM) 5 % Place 1 patch onto the skin daily as needed (pain). Remove & Discard patch within 12 hours or as directed by MD  . loratadine (CLARITIN) 10 MG tablet Take 10 mg by mouth daily.  . metFORMIN (GLUCOPHAGE-XR) 500 MG 24 hr tablet TAKE 2 TABLETS BY MOUTH TWO TIMES DAILY  . metolazone (ZAROXOLYN) 5 MG tablet Take 5 mg by mouth daily as needed (weight gain and edema).  . Multiple Vitamin (MULTIVITAMIN WITH MINERALS) TABS tablet Take 1 tablet by mouth at bedtime.   . Multiple Vitamins-Minerals (PRESERVISION AREDS 2) CAPS Take 1 capsule by mouth 2 (two) times  daily.   . polyvinyl alcohol (ARTIFICIAL TEARS) 1.4 % ophthalmic solution Place 1 drop into both eyes daily as needed for dry eyes.  . potassium chloride SA (K-DUR,KLOR-CON) 20 MEQ tablet Take 1 tablet (20 mEq total) by mouth 2 (two) times daily.  . potassium chloride SA (K-DUR,KLOR-CON) 20 MEQ tablet TAKE 1 TABLET BY MOUTH TWO  TIMES DAILY  . PRESCRIPTION MEDICATION Inhale into the lungs at bedtime. CPAP  . ranitidine (ZANTAC) 150 MG tablet Take 150 mg by mouth daily as needed for heartburn.   . sertraline (ZOLOFT) 100 MG tablet TAKE 1 TABLET BY MOUTH DAILY  . torsemide (DEMADEX) 100 MG tablet Take 100 mg in AM and 50 mg in PM if needed for weight gain  . valsartan (DIOVAN) 320 MG tablet Take 160 mg by mouth daily.  [name] warfarin (COUMADIN) 5 MG tablet TAKE 1 TABLET BY MOUTH TWICE A DAY   No current facility-administered medications on file prior to visit.    Allergies  Allergen Reactions  . Sunflower Seed [Sunflower Oil] Swelling and Other (See Comments)    Tongue and lip swelling  . Horse-Derived Products Other (See Comments)    Per allergy skin test  . Tetanus Toxoids Other (See Comments)    Per allergy skin test   PMHx:   Past Medical History:  Diagnosis Date  . Adrenal adenoma   . Anxiety   . Atypical atrial flutter ([place]) 8/15, 10/15   a. DCCV 08/2013. b. s/p RFA 10/2013.  [name] Basal cell carcinoma   . CAD (coronary artery disease)    a. 04/2013 CABG x 2: LIMA to LAD, SVG to RI, EVH via R thigh.  . Cellulitis 12/2015   left leg  . Chronic diastolic congestive heart failure ([place])   . CKD (chronic kidney disease), stage III   . Depression   . Diabetes mellitus type II   . Diverticulosis 2001  . DJD (degenerative joint disease)   . GERD (gastroesophageal reflux disease)   . H/O hiatal hernia   . History of cardioversion    x3 (years uncertain)  . Hx of adenomatous colonic polyps   . Hyperlipidemia   . Hypertension   . Hypertensive cardiomyopathy ([place])   . Obstructive sleep  apnea    compliant with CPAP  . 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 Persistent atrial fibrillation ([place])    a. s/p MAZE 04/2013 in setting of CABG. b. Amio stopped in 10/2013 after flutter ablation.  [name] PFO (patent foramen ovale)    a. Small PFO by TEE 10/2013.  [name] Pleural effusion, left    a. s/p thoracentesis 05/2013.  [name] Respiratory failure ([place])    a. Hypoxia 10/2013 - required supp  O2 as inpatient, did not require it at discharge.  . S/P Maze operation for atrial fibrillation    a. 04/2013: Complete bilateral atrial lesion set using cryothermy and bipolar radiofrequency ablation with clipping of LA appendage (@ time of CABG)  . S/P Maze operation for atrial fibrillation 04/05/2013   Complete bilateral atrial lesion set using cryothermy and bipolar radiofrequency ablation with clipping of LA appendage via median sternotomy approach    Immunization History  Administered Date(s) Administered  . DT 08/05/2015  . Influenza Split 10/25/2012  . Influenza, High Dose Seasonal PF 09/26/2013, 09/27/2014, 10/03/2015  . Pneumococcal Conjugate-13 01/30/2014  . Pneumococcal Polysaccharide-23 10/20/2011  . Td 01/06/2000   Past Surgical History:  Procedure Laterality Date  . ATRIAL FIBRILLATION ABLATION N/A 10/26/2013   Procedure: ATRIAL FIBRILLATION ABLATION;  Surgeon: Coralyn Mark, MD;  Location: [place] [facility] CATH LAB;  Service: Cardiovascular;  Laterality: N/A;  . BASAL CELL CARCINOMA EXCISION     x3 on face  . CARDIAC CATHETERIZATION     myocardial bridge but no cad  . CARDIOVERSION N/A 08/23/2013   Procedure: CARDIOVERSION;  Surgeon: Sanda Klein, MD;  Location: [place];  Service: Cardiovascular;  Laterality: N/A;  . CARPOMETACARPEL SUSPENSION PLASTY Left 02/14/2014   Procedure: CARPOMETACARPEL (Cylinder) SUSPENSIONPLASTY THUMB  WITH  ABDUCTOR POLLICIS LONGUS TRANSFER AND STENOSING  TENOSYNOVITIS RELEASE LEFT WRIST;  Surgeon: Charlotte Crumb, MD;  Location: [place];  Service: Orthopedics;  Laterality: Left;  . CATARACT EXTRACTION     bilateral  . CORONARY ARTERY BYPASS GRAFT N/A 04/05/2013   Procedure: CORONARY ARTERY BYPASS GRAFTING (CABG) TIMES TWO USING LEFT INTERNAL MAMMARY ARTERY AND RIGHT SAPHENOUS LEG VEIN HARVESTED ENDOSCOPICALLY;  Surgeon: Rexene Alberts, MD;  Location: [place];  Service: Open Heart Surgery;  Laterality: N/A;  . GREAT TOE ARTHRODESIS, INTERPHALANGEAL JOINT     Right foot  . INTRAOPERATIVE TRANSESOPHAGEAL ECHOCARDIOGRAM N/A 04/05/2013   Procedure: INTRAOPERATIVE TRANSESOPHAGEAL ECHOCARDIOGRAM;  Surgeon: Rexene Alberts, MD;  Location: [place];  Service: Open Heart Surgery;  Laterality: N/A;  . LEFT HEART CATHETERIZATION WITH CORONARY ANGIOGRAM N/A 03/07/2013   Procedure: LEFT HEART CATHETERIZATION WITH CORONARY ANGIOGRAM;  Surgeon: Burnell Blanks, MD;  Location: [facility] CATH LAB;  Service: Cardiovascular;  Laterality: N/A;  . MAZE N/A 04/05/2013   Procedure: MAZE;  Surgeon: Rexene Alberts, MD;  Location: [place];  Service: Open Heart Surgery;  Laterality: N/A;  . Polinydal cyst     Removed  . POLYPECTOMY    . Retina repair-right    . RIGHT HEART CATHETERIZATION N/A 05/03/2014   Procedure: RIGHT HEART CATH;  Surgeon: Jolaine Artist, MD;  Location: [facility] CATH LAB;  Service: Cardiovascular;  Laterality: N/A;  . TEE WITHOUT CARDIOVERSION N/A 08/23/2013   Procedure: TRANSESOPHAGEAL ECHOCARDIOGRAM (TEE);  Surgeon: Sanda Klein, MD;  Location: [facility] ENDOSCOPY;  Service: Cardiovascular;  Laterality: N/A;  . TEE WITHOUT CARDIOVERSION N/A 10/26/2013   Procedure: TRANSESOPHAGEAL ECHOCARDIOGRAM (TEE);  Surgeon: Sueanne Margarita, MD;  Location: [facility] ENDOSCOPY;  Service: Cardiovascular;  Laterality: N/A;  . TEE WITHOUT CARDIOVERSION N/A 06/05/2014   Procedure: TRANSESOPHAGEAL ECHOCARDIOGRAM (TEE);  Surgeon: Thayer Headings, MD;  Location: [facility] ENDOSCOPY;   Service: Cardiovascular;  Laterality: N/A;  . TRAPEZIUM RESECTION     FHx:    Reviewed / unchanged  SHx:    Reviewed / unchanged  Systems Review:  Constitutional: Denies fever, chills, wt changes, headaches, insomnia, fatigue, night sweats, change in appetite. Eyes: Denies redness, blurred vision, diplopia, discharge, itchy, watery  eyes.  ENT: Denies discharge, congestion, post nasal drip, epistaxis, sore throat, earache, hearing loss, dental pain, tinnitus, vertigo, sinus pain, snoring.  CV: Denies chest pain, palpitations, irregular heartbeat, syncope, dyspnea, diaphoresis, orthopnea, PND, claudication or edema. Respiratory: denies cough, dyspnea, DOE, pleurisy, hoarseness, laryngitis, wheezing.  Gastrointestinal: Denies dysphagia, odynophagia, heartburn, reflux, water brash, abdominal pain or cramps, nausea, vomiting, bloating, diarrhea, constipation, hematemesis, melena, hematochezia  or hemorrhoids. Genitourinary: Denies dysuria, frequency, urgency, nocturia, hesitancy, discharge, hematuria or flank pain. Musculoskeletal: Denies arthralgias, myalgias, stiffness, jt. swelling, pain, limping or strain/sprain.  Skin: Denies pruritus, rash, hives, warts, acne, eczema or change in skin lesion(s). Neuro: No weakness, tremor, incoordination, spasms, paresthesia or pain. Psychiatric: Denies confusion, memory loss or sensory loss. Endo: Denies change in weight, skin or hair change.  Heme/Lymph: No excessive bleeding, bruising or enlarged lymph nodes.  Physical Exam  BP 120/74   Pulse 81   Temp 97.5 F (36.4 C)   Resp 16   Ht 5' 11"  (1.803 m)   Wt 188 lb 12.8 oz (85.6 kg)   BMI 26.33 kg/m   Appears well nourished, well groomed  and in no distress.  Eyes: PERRLA, EOMs, conjunctiva no swelling or erythema. Sinuses: No frontal/maxillary tenderness ENT/Mouth: EAC's clear, TM's nl w/o erythema, bulging. Nares clear w/o erythema, swelling, exudates. Oropharynx clear without erythema or  exudates. Oral hygiene is good. Tongue normal, non obstructing. Hearing intact.  Neck: Supple. Thyroid nl. Car 2+/2+ without bruits, nodes or JVD. Chest: Respirations nl with BS clear & equal w/o rales, rhonchi, wheezing or stridor.  Cor: Heart sounds normal w/ regular rate and rhythm without sig. murmurs, gallops, clicks or rubs. Peripheral pulses normal and equal  without edema.  Abdomen: Soft & bowel sounds normal. Non-tender w/o guarding, rebound, hernias, masses or organomegaly.  Lymphatics: Unremarkable.  Musculoskeletal: Full ROM all peripheral extremities, joint stability, 5/5 strength and normal gait.  Skin: Warm, dry without exposed rashes, lesions or ecchymosis apparent.  Neuro: Cranial nerves intact, reflexes equal bilaterally. Sensory-motor testing grossly intact. Tendon reflexes grossly intact.  Pysch: Alert & oriented x 3.  Insight and judgement nl & appropriate. No ideations.  Assessment and Plan:  - Continue medication, monitor blood pressure at home.  - Continue DASH diet. Reminder to go to the ER if any CP,  SOB, nausea, dizziness, severe HA, changes vision/speech,  left arm numbness and tingling and jaw pain.  - Continue diet/meds, exercise,& lifestyle modifications.  - Continue monitor periodic cholesterol/liver & renal functions   - Continue diet, exercise, lifestyle modifications.  - Monitor appropriate labs. - Continue supplementation.      Discussed  regular exercise, BP monitoring, weight control to achieve/maintain BMI less than 25 and discussed med and SE's. Recommended labs to assess and monitor clinical status with further disposition pending results of labs. Over 30 minutes of exam, counseling, chart review was performed.

## 2016-07-02 LAB — LIPID PANEL
CHOL/HDL RATIO: 6.2 ratio — AB (ref <5.0)
CHOLESTEROL: 137 mg/dL (ref <200)
HDL: 22 mg/dL — ABNORMAL LOW (ref >40)
Triglycerides: 404 mg/dL — ABNORMAL HIGH (ref <150)

## 2016-07-02 LAB — TSH: TSH: 1.27 m[IU]/L (ref 0.40–4.50)

## 2016-07-02 LAB — INSULIN, RANDOM: Insulin: 30.3 u[IU]/mL — ABNORMAL HIGH (ref 2.0–19.6)

## 2016-07-02 LAB — HEMOGLOBIN A1C
HEMOGLOBIN A1C: 9.6 % — AB (ref <5.7)
Mean Plasma Glucose: 229 mg/dL

## 2016-07-02 LAB — PROTIME-INR
INR: 1.2 — ABNORMAL HIGH
Prothrombin Time: 12.3 s — ABNORMAL HIGH (ref 9.0–11.5)

## 2016-07-02 LAB — HEPATIC FUNCTION PANEL
ALT: 16 U/L (ref 9–46)
AST: 17 U/L (ref 10–35)
Albumin: 4.1 g/dL (ref 3.6–5.1)
Alkaline Phosphatase: 96 U/L (ref 40–115)
BILIRUBIN DIRECT: 0.1 mg/dL (ref <=0.2)
Indirect Bilirubin: 0.7 mg/dL (ref 0.2–1.2)
TOTAL PROTEIN: 7.1 g/dL (ref 6.1–8.1)
Total Bilirubin: 0.8 mg/dL (ref 0.2–1.2)

## 2016-07-02 LAB — BASIC METABOLIC PANEL WITH GFR
BUN: 31 mg/dL — AB (ref 7–25)
CALCIUM: 9 mg/dL (ref 8.6–10.3)
CHLORIDE: 91 mmol/L — AB (ref 98–110)
CO2: 28 mmol/L (ref 20–31)
CREATININE: 1.91 mg/dL — AB (ref 0.70–1.18)
GFR, Est African American: 38 mL/min — ABNORMAL LOW (ref >=60)
GFR, Est Non African American: 33 mL/min — ABNORMAL LOW (ref >=60)
GLUCOSE: 410 mg/dL — AB (ref 65–99)
Potassium: 3.5 mmol/L (ref 3.5–5.3)
Sodium: 136 mmol/L (ref 135–146)

## 2016-07-02 LAB — MAGNESIUM: MAGNESIUM: 1.9 mg/dL (ref 1.5–2.5)

## 2016-07-02 LAB — VITAMIN D 25 HYDROXY (VIT D DEFICIENCY, FRACTURES): VIT D 25 HYDROXY: 61 ng/mL (ref 30–100)

## 2016-07-02 LAB — URIC ACID: Uric Acid, Serum: 6.2 mg/dL (ref 4.0–8.0)

## 2016-07-05 ENCOUNTER — Other Ambulatory Visit: Payer: Self-pay | Admitting: Cardiology

## 2016-07-12 ENCOUNTER — Other Ambulatory Visit: Payer: Self-pay | Admitting: Cardiology

## 2016-07-16 ENCOUNTER — Encounter (INDEPENDENT_AMBULATORY_CARE_PROVIDER_SITE_OTHER): Payer: Medicare Other | Admitting: Ophthalmology

## 2016-07-16 DIAGNOSIS — H353111 Nonexudative age-related macular degeneration, right eye, early dry stage: Secondary | ICD-10-CM | POA: Diagnosis not present

## 2016-07-16 DIAGNOSIS — H34832 Tributary (branch) retinal vein occlusion, left eye, with macular edema: Secondary | ICD-10-CM | POA: Diagnosis not present

## 2016-07-16 DIAGNOSIS — H43813 Vitreous degeneration, bilateral: Secondary | ICD-10-CM | POA: Diagnosis not present

## 2016-07-16 DIAGNOSIS — H35033 Hypertensive retinopathy, bilateral: Secondary | ICD-10-CM | POA: Diagnosis not present

## 2016-07-16 DIAGNOSIS — I10 Essential (primary) hypertension: Secondary | ICD-10-CM

## 2016-07-16 DIAGNOSIS — E113291 Type 2 diabetes mellitus with mild nonproliferative diabetic retinopathy without macular edema, right eye: Secondary | ICD-10-CM | POA: Diagnosis not present

## 2016-07-16 DIAGNOSIS — E113312 Type 2 diabetes mellitus with moderate nonproliferative diabetic retinopathy with macular edema, left eye: Secondary | ICD-10-CM | POA: Diagnosis not present

## 2016-07-16 DIAGNOSIS — E11311 Type 2 diabetes mellitus with unspecified diabetic retinopathy with macular edema: Secondary | ICD-10-CM | POA: Diagnosis not present

## 2016-07-25 ENCOUNTER — Other Ambulatory Visit: Payer: Self-pay | Admitting: Internal Medicine

## 2016-08-03 NOTE — Progress Notes (Signed)
MEDICARE ANNUAL WELLNESS VISIT AND FOLLOW UP Assessment:   PAF (paroxysmal atrial fibrillation) ([place]) Continue follow up cardio, continue coumadin -     CBC with Differential/Platelet  Chronic diastolic congestive heart failure (HCC) Weight stable, continue medications, refill potassium -     potassium chloride SA (K-DUR,KLOR-CON) 20 MEQ tablet; Take 1 tablet (20 mEq total) by mouth 2 (two) times daily.  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 Call if greater than 130/80.  -     BASIC METABOLIC PANEL WITH GFR  Coronary artery disease involving native coronary artery of native heart without angina pectoris Control blood pressure, cholesterol, glucose, increase exercise.   PFO (patent foramen ovale) Monitoring with cardio  Pulmonary hypertension (HCC) Monitored by cardio and pulmonary, doing much better  Atrial flutter, unspecified type ([place]) Continue follow up cardio, continue coumadin  Chronic respiratory failure with hypoxia (HCC) Monitored by pulmonary  OSA on CPAP Continue CPAP  Chronic restrictive lung disease Monitored by pulmonary  Diverticulosis of large intestine without hemorrhage  No tenderness, continue fiber  GERD Continue PPI/H2 blocker, diet discussed  Controlled type 2 diabetes mellitus with stage 3 chronic kidney disease, without long-term current use of insulin ([place]) Discussed general issues about diabetes pathophysiology and management., Educational material distributed., Suggested low cholesterol diet., Encouraged aerobic exercise., Discussed foot care.,  Reminded to get yearly retinal exam. WILL SWITCH TO GLIPIZIDE BUT IF SUGARS NOT BETTER WILL SWITCH TO 70/30 NEXT OV  Osteoarthritis, unspecified osteoarthritis type, unspecified site RICE, NSAIDS, exercises given, if not better get xray and PT referral or ortho referral.   CKD (chronic kidney disease) stage 3, GFR 30-59 ml/min  avoid NSAIDS, monitor sugars, will monitor -     BASIC METABOLIC PANEL WITH GFR  Hyperlipidemia -continue medications, check lipids, decrease fatty foods, increase activity.   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   Chronic anticoagulation Monitor INR  Colonic Polyps Up to date  Vitamin D deficiency Continue supplement  Medication management  S/P Maze operation for atrial fibrillation Continue follow up cardio  Medicare annual wellness visit, subsequent [name] was seen today for medicare wellness and follow-up.  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   Gout, unspecified cause, unspecified chronicity, unspecified site monitor  Advanced care planning and counseling discussion Has living will scanned into epic 04/2014, no changes Has yellow DNR at the house  Over 30 minutes of exam, counseling, chart review, and critical decision making was performed  Future Appointments Date Time Provider [place]  09/04/2016 11:15 AM Vicie Mutters, PA-C [facility] None  10/05/2016 10:30 AM [name], MD [facility] None  10/22/2016 8:15 AM Hayden Pedro, MD [facility] None  04/21/2017 2:00 PM [name], MD [facility] None     Plan:   During the course of the visit the patient was educated and counseled about  appropriate screening and preventive services including:    Pneumococcal vaccine   Influenza vaccine  Prevnar 13  Td vaccine  Screening electrocardiogram  Colorectal cancer screening  Diabetes screening  Glaucoma screening  Nutrition counseling    Subjective:  [name] is a 76 y.o. male who presents for Medicare Annual Wellness Visit and 3 month follow up for HTN, hyperlipidemia, diabetes with CKD, and vitamin D Def.   His blood pressure has been controlled at home, today their BP is BP: 118/76 He does workout, walks and works out in the yard. He denies chest pain, shortness of breath, dizziness.  He has a complicated heart history, had ASCAD and pfib/flutter in Apr 2015 with subsequent CABG with MAZE. 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. Weight is stable.  He is still on coumadin at this time, denies any bleeding, black stools, etc. He is on 32m daily.  Lab Results  Component Value Date   INR 1.2 (H) 07/01/2016   INR 1.2 (H) 05/28/2016   INR 1.9 (H) 04/27/2016   He is on cholesterol medication and denies myalgias. His cholesterol is at goal. The cholesterol last visit was:   Lab Results  Component Value Date   CHOL 137 07/01/2016   HDL 22 (L) 07/01/2016   LDLCALC NOT CALC 07/01/2016   TRIG 404 (H) 07/01/2016   CHOLHDL 6.2 (H) 07/01/2016   He has been working on diet and exercise for Diabetes with diabetic chronic kidney disease and with other circulatory complications, he is not on bASA, he is on ACE/ARB, and denies polydipsia, polyuria and visual disturbances. Last A1C was:  Lab Results  Component Value Date   HGBA1C 9.6 (H) 07/01/2016   Last GFR Lab Results  Component Value Date   GFRNONAA 33 (L) 07/01/2016    Patient is on Vitamin D supplement.   Lab Results  Component Value Date   VD25OH 64006/27/2018     He is on xanax as  needed for sleep and zoloft for depression/anxiety which is in remission.  BMI is Body mass index is 25.72 kg/m., he is working on diet and exercise. Wt Readings from Last 3 Encounters:  08/04/16 184 lb 6.4 oz (83.6 kg)  07/01/16 188 lb 12.8 oz (85.6 kg)  06/18/16 187 lb 12.8 oz (85.2 kg)    Medication Review: Current Outpatient Prescriptions on File Prior to Visit  Medication Sig  . acetaminophen (TYLENOL) 500 MG tablet Take 500-1,000 mg by mouth every 6 (six) hours as needed (pain).  .Marland Kitchenallopurinol (ZYLOPRIM) 100 MG tablet Take 100 mg by mouth daily. Takes 2.5 tablets daily  . ALPRAZolam (XANAX) 1 MG tablet Take 0.5 tablets (0.5 mg total) by mouth at bedtime as needed for sleep.  .Marland KitchenAlum Hydroxide-Mag Carbonate (GAVISCON PO) Take 1 tablet by mouth daily as needed (acid reflux).  .Marland Kitchenatorvastatin (LIPITOR) 40 MG tablet Take 1 tablet (40 mg total) by mouth daily.  .Marland Kitchenazelastine (ASTELIN) 0.1 % nasal spray Place 2 sprays into both nostrils 2 (two) times daily. Use in each nostril as directed  . B Complex Vitamins (VITAMIN B COMPLEX PO) Take 1 tablet by mouth at bedtime.   . bacitracin 500 UNIT/GM ointment Apply 1 application topically daily as needed for wound care.  . Blood Glucose Monitoring Suppl (ACCU-CHEK AVIVA PLUS) w/Device KIT Check blood sugar 1 time  daily  . carvedilol (COREG) 6.25 MG tablet TAKE 1 TABLET (6.25 MG TOTAL) BY MOUTH 2 (TWO) TIMES  DAILY.  . Cholecalciferol (VITAMIN D-3) 5000 units TABS Take 5,000 Units by mouth at bedtime.  . colchicine 0.6 MG tablet   . diphenhydrAMINE (BENADRYL) 25 MG tablet Take 25 mg by mouth at bedtime as needed for allergies.   . Dulaglutide (TRULICITY) 9.62 EZ/6.6QH SOPN Inject 1 pen into the skin once a week.  . fluticasone (FLONASE) 50 MCG/ACT nasal spray Place 1 spray into both nostrils daily as needed for allergies.   [name] glimepiride (AMARYL) 4 MG tablet Take 1 tablet 2 times daily.  [name] glucose blood (ACCU-CHEK AVIVA PLUS) test strip CHECK BLOOD  GLUCOSE 3 TIMES DAILY.  [name] HYDROcodone-acetaminophen (NORCO/VICODIN) 5-325 MG tablet Take 1-2 tablets by mouth every 6 (six) hours as needed for moderate pain.   [name] lidocaine (LIDODERM) 5 % Place 1 patch onto the skin daily as needed (pain). Remove & Discard patch within 12 hours or as directed by MD  . loratadine (CLARITIN) 10 MG tablet Take 10 mg by mouth daily.  . metFORMIN (GLUCOPHAGE-XR) 500 MG 24 hr tablet TAKE 2 TABLETS BY MOUTH TWO TIMES DAILY  . metolazone (ZAROXOLYN) 5 MG tablet Take 5 mg by mouth daily as needed (weight gain and edema).  . Multiple Vitamin (MULTIVITAMIN WITH MINERALS) TABS tablet Take 1 tablet by mouth at bedtime.   . Multiple Vitamins-Minerals (PRESERVISION AREDS 2) CAPS Take 1 capsule by mouth 2 (two) times daily.   . polyvinyl alcohol (ARTIFICIAL TEARS) 1.4 % ophthalmic solution Place 1 drop into both eyes daily as needed for dry eyes.  . potassium chloride SA (K-DUR,KLOR-CON) 20 MEQ tablet Take 1 tablet (20 mEq total) by mouth 2 (two) times daily.  [name] PRESCRIPTION MEDICATION Inhale into the lungs at bedtime. CPAP  . ranitidine (ZANTAC) 150 MG tablet Take 150 mg by mouth daily as needed for heartburn.   . sertraline (ZOLOFT) 100 MG tablet TAKE 1 TABLET BY MOUTH DAILY  . torsemide (DEMADEX) 100 MG tablet Take 100 mg in AM and 50 mg in PM if needed for weight gain  . valsartan (DIOVAN) 320 MG tablet TAKE 0.5 TABLETS (160 MG TOTAL) BY MOUTH DAILY.  [name] warfarin (COUMADIN) 5 MG tablet TAKE 1 TABLET BY MOUTH TWICE A DAY   No current facility-administered medications on file prior to visit.     Allergies: Allergies  Allergen Reactions  . Sunflower Seed [Sunflower Oil] Swelling and Other (See Comments)    Tongue and lip swelling  . Horse-Derived Products Other (See Comments)    Per allergy skin test  . 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; T2_NIDDM w/CKD3 (GFR 50 ml/min); GERD; DJD (degenerative joint disease); Pleural effusion on left; Vitamin D deficiency; Medication management; Acute on chronic diastolic CHF (congestive heart failure), NYHA class 3 ([place]); Chronic anticoagulation; HTN (hypertension); Chronic respiratory failure (HCC); CAD (coronary artery disease); PFO (patent foramen ovale); 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[place]); OSA on CPAP; S/P Maze operation for atrial fibrillation; Medicare annual wellness visit, subsequent; Pulmonary hypertension ([place]); 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  Screening Tests Immunization History  Administered Date(s) Administered  . DT 08/05/2015  . Influenza Split  10/25/2012  . Influenza, High Dose Seasonal PF 09/26/2013, 09/27/2014, 10/03/2015  . Pneumococcal Conjugate-13 01/30/2014  . Pneumococcal Polysaccharide-23 10/20/2011  . Td 01/06/2000   Preventative care: Last colonoscopy: 2008  Prior vaccinations: TD or Tdap: 2002 Influenza: 2016 Pneumococcal: 2013 Prevnar13: 2016 Shingles/Zostavax: undecided  Names of Other Physician/Practitioners you currently use: 1. [facility] here for primary care 2. Dr. Zigmund Daniel, eye doctor, last visit 07/2016 3. Dr. Nicki Reaper, dentist, last visit 06/2016 Patient Care Team: [name], MD as PCP - General (Internal Medicine) Inda Castle, MD as Consulting Physician (Gastroenterology) [place], Peter M, MD as Consulting Physician (Cardiology) Rexene Alberts, MD as Consulting Physician (Cardiothoracic Surgery) Szott, Prescott Gum, DDS as Referring  Physician Hayden Pedro, MD as Consulting Physician (Ophthalmology) Lavonna Monarch, MD as Consulting Physician (Dermatology) Josue Hector, MD as Consulting Physician (Cardiology) Thompson Grayer, MD as Consulting Physician (Cardiology) Pleasant, Eppie Gibson, RN as [place] Management  Surgical: He  has a past surgical history that includes Polinydal cyst; Great toe arthrodesis, interphalangeal joint; Retina repair-right; Cataract extraction; Excision basal cell carcinoma; Polypectomy; Cardiac catheterization; Coronary artery bypass graft (N/A, 04/05/2013); MAZE (N/A, 04/05/2013); Intraoprative transesophageal echocardiogram (N/A, 04/05/2013); TEE without cardioversion (N/A, 08/23/2013); Cardioversion (N/A, 08/23/2013); TEE without cardioversion (N/A, 10/26/2013); left heart catheterization with coronary angiogram (N/A, 03/07/2013); atrial fibrillation ablation (N/A, 10/26/2013); Carpometacarpel [facility]) suspension plasty (Left, 02/14/2014); Trapezium resection; right heart catheterization (N/A, 05/03/2014); and TEE without cardioversion (N/A, 06/05/2014). Family His family history includes Atrial fibrillation in his mother; Colon cancer in his mother; Colon polyps in his mother and sister; Dementia in his father; Diabetes in his maternal uncle; Hypertension in his mother; Stroke in his paternal uncle. Social history  He reports that he quit smoking about 35 years ago. His smoking use included Cigarettes. He has a 100.00 pack-year smoking history. He has never used smokeless tobacco. He reports that he drinks about 0.6 oz of alcohol per week . He reports that he does not use drugs.  MEDICARE WELLNESS OBJECTIVES: Physical activity: Current Exercise Habits: Home exercise routine, Type of exercise: walking, Time (Minutes): 25, Frequency (Times/Week): 3, Weekly Exercise (Minutes/Week): 75, Intensity: Mild Cardiac risk factors: Cardiac Risk Factors include: advanced age (>48mn, >>91women);diabetes  mellitus;hypertension;sedentary lifestyle;male gender;dyslipidemia Depression/mood screen:   [facility]  Decreased Interest 0  Down, Depressed, Hopeless 0  PHQ - 2 Score 0  Some recent data might be hidden    ADLs:  In your present state of health, do you have any difficulty performing the following activities: 08/04/2016 07/01/2016  Hearing? N N  Vision? N N  Difficulty concentrating or making decisions? N N  Walking or climbing stairs? N N  Dressing or bathing? N N  Doing errands, shopping? N N  Preparing Food and eating ? - -  Using the Toilet? - -  In the past six months, have you accidently leaked urine? - -  Do you have problems with loss of bowel control? - -  Managing your Medications? - -  Managing your Finances? - -  Housekeeping or managing your Housekeeping? - -  Some recent data might be hidden     Cognitive Testing  Alert? Yes  Normal Appearance?Yes  Oriented to person? Yes  Place? Yes   Time? Yes  Recall of three objects?  Yes  Can perform simple calculations? Yes  Displays appropriate judgment?Yes  Can read the correct time from a watch face?Yes  EOL planning: Does Patient  Have a Medical Advance Directive?: Yes Type of Advance Directive: Healthcare Power of Attorney, Living will [place] in Chart?: No - copy requested  Review of Systems  Constitutional: Negative for chills, fever and malaise/fatigue.  HENT: Negative.   Respiratory: Positive for shortness of breath (better). Negative for cough and wheezing.   Cardiovascular: Negative for chest pain, orthopnea, leg swelling (improved) and PND.  Gastrointestinal: Negative.   Genitourinary: Negative for dysuria, flank pain, frequency, hematuria and urgency.  Musculoskeletal: Negative.   Skin: Negative.   Neurological: Negative for dizziness, tingling, tremors, sensory change, speech change, focal weakness, seizures, loss of consciousness and weakness.   Psychiatric/Behavioral: Negative.  Negative for depression and suicidal ideas.     Objective:   Today's Vitals   08/04/16 1113  BP: 118/76  Pulse: 88  Resp: 14  Temp: (!) 97.3 F (36.3 C)  SpO2: 98%  Weight: 184 lb 6.4 oz (83.6 kg)  Height: 5' 11"  (1.803 m)  PainSc: 2   PainLoc: Shoulder   Body mass index is 25.72 kg/m.  General appearance: alert, no distress, WD/WN, male HEENT: normocephalic, sclerae anicteric, TMs pearly, nares patent, no discharge or erythema, pharynx normal Oral cavity: MMM, no lesions Neck: supple, no lymphadenopathy, no thyromegaly, no masses Heart: RRR, normal S1, S2, no murmurs Lungs: CTA bilaterally, no wheezes, rhonchi, or rales Abdomen: +bs, soft, non tender, non distended, no masses, no hepatomegaly, no splenomegaly Musculoskeletal: nontender, no swelling, no obvious deformity Extremities: no edema, no cyanosis, no clubbing Pulses: 2+ symmetric, upper and lower extremities, normal cap refill Neurological: alert, oriented x 3, CN2-12 intact, strength normal upper extremities and lower extremities, sensation normal throughout, DTRs 2+ throughout, no cerebellar signs, gait normal Psychiatric: normal affect, behavior normal, pleasant   Medicare Attestation I have personally reviewed: The patient's medical and social history Their use of alcohol, tobacco or illicit drugs Their current medications and supplements The patient's functional ability including ADLs,fall risks, home safety risks, cognitive, and hearing and visual impairment Diet and physical activities Evidence for depression or mood disorders  The patient's weight, height, BMI, and visual acuity have been recorded in the chart.  I have made referrals, counseling, and provided education to the patient based on review of the above and I have provided the patient with a written personalized care plan for preventive services.     Vicie Mutters, PA-C   08/04/2016

## 2016-08-04 ENCOUNTER — Ambulatory Visit (INDEPENDENT_AMBULATORY_CARE_PROVIDER_SITE_OTHER): Payer: Medicare Other | Admitting: Physician Assistant

## 2016-08-04 ENCOUNTER — Encounter: Payer: Self-pay | Admitting: Physician Assistant

## 2016-08-04 VITALS — BP 118/76 | HR 88 | Temp 97.3°F | Resp 14 | Ht 71.0 in | Wt 184.4 lb

## 2016-08-04 DIAGNOSIS — I5033 Acute on chronic diastolic (congestive) heart failure: Secondary | ICD-10-CM | POA: Diagnosis not present

## 2016-08-04 DIAGNOSIS — N183 Chronic kidney disease, stage 3 unspecified: Secondary | ICD-10-CM

## 2016-08-04 DIAGNOSIS — Z7189 Other specified counseling: Secondary | ICD-10-CM | POA: Diagnosis not present

## 2016-08-04 DIAGNOSIS — J9611 Chronic respiratory failure with hypoxia: Secondary | ICD-10-CM

## 2016-08-04 DIAGNOSIS — I272 Pulmonary hypertension, unspecified: Secondary | ICD-10-CM

## 2016-08-04 DIAGNOSIS — Z0001 Encounter for general adult medical examination with abnormal findings: Secondary | ICD-10-CM | POA: Diagnosis not present

## 2016-08-04 DIAGNOSIS — G4733 Obstructive sleep apnea (adult) (pediatric): Secondary | ICD-10-CM

## 2016-08-04 DIAGNOSIS — I110 Hypertensive heart disease with heart failure: Secondary | ICD-10-CM

## 2016-08-04 DIAGNOSIS — E1122 Type 2 diabetes mellitus with diabetic chronic kidney disease: Secondary | ICD-10-CM

## 2016-08-04 DIAGNOSIS — Z86010 Personal history of colonic polyps: Secondary | ICD-10-CM

## 2016-08-04 DIAGNOSIS — E782 Mixed hyperlipidemia: Secondary | ICD-10-CM

## 2016-08-04 DIAGNOSIS — I480 Paroxysmal atrial fibrillation: Secondary | ICD-10-CM

## 2016-08-04 DIAGNOSIS — K2100 Gastro-esophageal reflux disease with esophagitis, without bleeding: Secondary | ICD-10-CM

## 2016-08-04 DIAGNOSIS — Q2112 Patent foramen ovale: Secondary | ICD-10-CM

## 2016-08-04 DIAGNOSIS — M1990 Unspecified osteoarthritis, unspecified site: Secondary | ICD-10-CM

## 2016-08-04 DIAGNOSIS — Z8679 Personal history of other diseases of the circulatory system: Secondary | ICD-10-CM

## 2016-08-04 DIAGNOSIS — Q211 Atrial septal defect: Secondary | ICD-10-CM

## 2016-08-04 DIAGNOSIS — Z0000 Encounter for general adult medical examination without abnormal findings: Secondary | ICD-10-CM

## 2016-08-04 DIAGNOSIS — M109 Gout, unspecified: Secondary | ICD-10-CM

## 2016-08-04 DIAGNOSIS — I4892 Unspecified atrial flutter: Secondary | ICD-10-CM | POA: Diagnosis not present

## 2016-08-04 DIAGNOSIS — I10 Essential (primary) hypertension: Secondary | ICD-10-CM | POA: Diagnosis not present

## 2016-08-04 DIAGNOSIS — F3342 Major depressive disorder, recurrent, in full remission: Secondary | ICD-10-CM

## 2016-08-04 DIAGNOSIS — Z9989 Dependence on other enabling machines and devices: Secondary | ICD-10-CM

## 2016-08-04 DIAGNOSIS — Q262 Total anomalous pulmonary venous connection: Secondary | ICD-10-CM

## 2016-08-04 DIAGNOSIS — I5032 Chronic diastolic (congestive) heart failure: Secondary | ICD-10-CM | POA: Diagnosis not present

## 2016-08-04 DIAGNOSIS — Z7901 Long term (current) use of anticoagulants: Secondary | ICD-10-CM

## 2016-08-04 DIAGNOSIS — E559 Vitamin D deficiency, unspecified: Secondary | ICD-10-CM

## 2016-08-04 DIAGNOSIS — I43 Cardiomyopathy in diseases classified elsewhere: Secondary | ICD-10-CM

## 2016-08-04 DIAGNOSIS — Z951 Presence of aortocoronary bypass graft: Secondary | ICD-10-CM

## 2016-08-04 DIAGNOSIS — R6889 Other general symptoms and signs: Secondary | ICD-10-CM

## 2016-08-04 DIAGNOSIS — I2510 Atherosclerotic heart disease of native coronary artery without angina pectoris: Secondary | ICD-10-CM

## 2016-08-04 DIAGNOSIS — K5730 Diverticulosis of large intestine without perforation or abscess without bleeding: Secondary | ICD-10-CM

## 2016-08-04 DIAGNOSIS — Z9889 Other specified postprocedural states: Secondary | ICD-10-CM

## 2016-08-04 DIAGNOSIS — Z79899 Other long term (current) drug therapy: Secondary | ICD-10-CM

## 2016-08-04 DIAGNOSIS — J984 Other disorders of lung: Secondary | ICD-10-CM

## 2016-08-04 LAB — CBC WITH DIFFERENTIAL/PLATELET
BASOS ABS: 0 {cells}/uL (ref 0–200)
Basophils Relative: 0 %
EOS PCT: 2 %
Eosinophils Absolute: 174 cells/uL (ref 15–500)
HCT: 35 % — ABNORMAL LOW (ref 38.5–50.0)
Hemoglobin: 11.7 g/dL — ABNORMAL LOW (ref 13.2–17.1)
Lymphocytes Relative: 9 %
Lymphs Abs: 783 cells/uL — ABNORMAL LOW (ref 850–3900)
MCH: 32.1 pg (ref 27.0–33.0)
MCHC: 33.4 g/dL (ref 32.0–36.0)
MCV: 95.9 fL (ref 80.0–100.0)
MPV: 8.6 fL (ref 7.5–12.5)
Monocytes Absolute: 696 cells/uL (ref 200–950)
Monocytes Relative: 8 %
NEUTROS PCT: 81 %
Neutro Abs: 7047 cells/uL (ref 1500–7800)
Platelets: 197 10*3/uL (ref 140–400)
RBC: 3.65 MIL/uL — ABNORMAL LOW (ref 4.20–5.80)
RDW: 14.7 % (ref 11.0–15.0)
WBC: 8.7 10*3/uL (ref 3.8–10.8)

## 2016-08-04 MED ORDER — GLIPIZIDE 5 MG PO TABS: 5.0000 mg | ORAL_TABLET | Freq: Three times a day (TID) | ORAL | 4 refills | Status: DC

## 2016-08-04 NOTE — Patient Instructions (Signed)
Your A1C is a measure of your sugar over the past 3 months and is not affected by what you have eaten over the past few days. Diabetes increases your chances of stroke and heart attack over 300 % and is the leading cause of blindness and kidney failure in the [place]. Please make sure you decrease bad carbs like white bread, white rice, potatoes, corn, soft drinks, pasta, cereals, refined sugars, sweet tea, dried fruits, and fruit juice. Good carbs are okay to eat in moderation like sweet potatoes, brown rice, whole grain pasta/bread, most fruit (except dried fruit) and you can eat as many veggies as you want.   Greater than 6.5 is considered diabetic. Between 6.4 and 5.7 is prediabetic If your A1C is less than 5.7 you are NOT diabetic.  Targets for Glucose Readings: Time of Check Target for patients WITHOUT Diabetes Target for DIABETICS  Before Meals Less than 100  less than 150  Two hours after meals Less than 200  Less than 250    If your morning sugar is always below 100 then the issue is with your sugar spiking after meals. Try to take your blood sugar approximately 2 hours after eating, this number should be less than 200. If it is not, think about the foods that you ate and better choices you can make.   Please be careful with glipizide (Glucotrol). This medication forces your blood sugar down no matter what it is starting at.If at any time you start to have low blood sugars in the morning or during the day please stop this medication. Please never take this medication if you are sick or can not eat. A low blood sugar is much more dangerous than a high blood sugar. Your brain needs two things, sugar and oxygen.   If this does not help your sugars we will switch you to a 70/30 insulin mix to start twice a day and call the office for an appointment   Hypoglycemia Hypoglycemia occurs when the level of sugar (glucose) in the blood is too low. Glucose is a type of sugar that provides the  body's main source of energy. Certain hormones (insulin and glucagon) control the level of glucose in the blood. Insulin lowers blood glucose, and glucagon increases blood glucose. Hypoglycemia can result from having too much insulin in the bloodstream, or from not eating enough food that contains glucose. Hypoglycemia can happen in people who do or do not have diabetes. It can develop quickly, and it can be a medical emergency. What are the causes? Hypoglycemia occurs most often in people who have diabetes. If you have diabetes, hypoglycemia may be caused by:  Diabetes medicine.  Not eating enough, or not eating often enough.  Increased physical activity.  Drinking alcohol, especially when you have not eaten recently.  If you do not have diabetes, hypoglycemia may be caused by:  A tumor in the pancreas. The pancreas is the organ that makes insulin.  Not eating enough, or not eating for long periods at a time (fasting).  Severe infection or illness that affects the liver, heart, or kidneys.  Certain medicines.  You may also have reactive hypoglycemia. This condition causes hypoglycemia within 4 hours of eating a meal. This may occur after having stomach surgery. Sometimes, the cause of reactive hypoglycemia is not known. What increases the risk? Hypoglycemia is more likely to develop in:  People who have diabetes and take medicines to lower blood glucose.  People who abuse alcohol.  People who have a severe illness.  What are the signs or symptoms? Hypoglycemia may not cause any symptoms. If you have symptoms, they may include:  Hunger.  Anxiety.  Sweating and feeling clammy.  Confusion.  Dizziness or feeling light-headed.  Sleepiness.  Nausea.  Increased heart rate.  Headache.  Blurry vision.  Seizure.  Nightmares.  Tingling or numbness around the mouth, lips, or tongue.  A change in speech.  Decreased ability to concentrate.  A change in  coordination.  Restless sleep.  Tremors or shakes.  Fainting.  Irritability.  How is this diagnosed? Hypoglycemia is diagnosed with a blood test to measure your blood glucose level. This blood test is done while you are having symptoms. Your health care provider may also do a physical exam and review your medical history. If you do not have diabetes, other tests may be done to find the cause of your hypoglycemia. How is this treated? This condition can often be treated by immediately eating or drinking something that contains glucose, such as:  3-4 sugar tablets (glucose pills).  Glucose gel, 15-gram tube.  Fruit juice, 4 oz (120 mL).  Regular soda (not diet soda), 4 oz (120 mL).  Low-fat milk, 4 oz (120 mL).  Several pieces of hard candy.  Sugar or honey, 1 Tbsp.  Treating Hypoglycemia If You Have Diabetes  If you are alert and able to swallow safely, follow the 15:15 rule:  Take 15 grams of a rapid-acting carbohydrate. Rapid-acting options include: ? 1 tube of glucose gel. ? 3 glucose pills. ? 6-8 pieces of hard candy. ? 4 oz (120 mL) of fruit juice. ? 4 oz (120 ml) of regular (not diet) soda.  Check your blood glucose 15 minutes after you take the carbohydrate.  If the repeat blood glucose level is still at or below 70 mg/dL (3.9 mmol/L), take 15 grams of a carbohydrate again.  If your blood glucose level does not increase above 70 mg/dL (3.9 mmol/L) after 3 tries, seek emergency medical care.  After your blood glucose level returns to normal, eat a meal or a snack within 1 hour.  Treating Severe Hypoglycemia Severe hypoglycemia is when your blood glucose level is at or below 54 mg/dL (3 mmol/L). Severe hypoglycemia is an emergency. Do not wait to see if the symptoms will go away. Get medical help right away. Call your local emergency services (911 in the [place] Do not drive yourself to the hospital. If you have severe hypoglycemia and you cannot eat or drink,  you may need an injection of glucagon. A family member or close friend should learn how to check your blood glucose and how to give you a glucagon injection. Ask your health care provider if you need to have an emergency glucagon injection kit available. Severe hypoglycemia may need to be treated in a hospital. The treatment may include getting glucose through an IV tube. You may also need treatment for the cause of your hypoglycemia. Follow these instructions at home: General instructions  Avoid any diets that cause you to not eat enough food. Talk with your health care provider before you start any new diet.  Take over-the-counter and prescription medicines only as told by your health care provider.  Limit alcohol intake to no more than 1 drink per day for nonpregnant women and 2 drinks per day for men. One drink equals 12 oz of beer, 5 oz of wine, or 1 oz of hard liquor.  Keep all follow-up visits as  told by your health care provider. This is important. If You Have Diabetes:   Make sure you know the symptoms of hypoglycemia.  Always have a rapid-acting carbohydrate snack with you to treat low blood sugar.  Follow your diabetes management plan, as told by your health care provider. Make sure you: ? Take your medicines as directed. ? Follow your exercise plan. ? Follow your meal plan. Eat on time, and do not skip meals. ? Check your blood glucose as often as directed. Make sure to check your blood glucose before and after exercise. If you exercise longer or in a different way than usual, check your blood glucose more often. ? Follow your sick day plan whenever you cannot eat or drink normally. Make this plan in advance with your health care provider.  Share your diabetes management plan with people in your workplace, school, and household.  Check your urine for ketones when you are ill and as told by your health care provider.  Carry a medical alert card or wear medical alert  jewelry. If You Have Reactive Hypoglycemia or Low Blood Sugar From Other Causes:  Monitor your blood glucose as told by your health care provider.  Follow instructions from your health care provider about eating or drinking restrictions. Contact a health care provider if:  You have problems keeping your blood glucose in your target range.  You have frequent episodes of hypoglycemia. Get help right away if:  You continue to have hypoglycemia symptoms after eating or drinking something containing glucose.  Your blood glucose is at or below 54 mg/dL (3 mmol/L).  You have a seizure.  You faint. These symptoms may represent a serious problem that is an emergency. Do not wait to see if the symptoms will go away. Get medical help right away. Call your local emergency services (911 in the [place] Do not drive yourself to the hospital. This information is not intended to replace advice given to you by your health care provider. Make sure you discuss any questions you have with your health care provider. Document Released: 12/22/2004 Document Revised: 06/05/2015 Document Reviewed: 01/25/2015 Elsevier Interactive Patient Education  Shafer Schein.

## 2016-08-05 LAB — PROTIME-INR
INR: 1.8 — AB
PROTHROMBIN TIME: 19 s — AB (ref 9.0–11.5)

## 2016-08-05 LAB — BASIC METABOLIC PANEL WITH GFR
BUN: 41 mg/dL — ABNORMAL HIGH (ref 7–25)
CALCIUM: 9.4 mg/dL (ref 8.6–10.3)
CO2: 29 mmol/L (ref 20–31)
CREATININE: 2.17 mg/dL — AB (ref 0.70–1.18)
Chloride: 93 mmol/L — ABNORMAL LOW (ref 98–110)
GFR, Est African American: 33 mL/min — ABNORMAL LOW (ref >=60)
GFR, Est Non African American: 28 mL/min — ABNORMAL LOW (ref >=60)
Glucose, Bld: 241 mg/dL — ABNORMAL HIGH (ref 65–99)
Potassium: 3.5 mmol/L (ref 3.5–5.3)
SODIUM: 139 mmol/L (ref 135–146)

## 2016-08-07 ENCOUNTER — Other Ambulatory Visit (HOSPITAL_COMMUNITY): Payer: Self-pay | Admitting: Internal Medicine

## 2016-08-26 ENCOUNTER — Encounter: Payer: Self-pay | Admitting: *Deleted

## 2016-09-03 NOTE — Progress Notes (Signed)
[facility]   [name], M.D.       Uvaldo Bristle. Silverio Lay, P.A.-C [facility]                [address] [place] [address] Telephone [phone] Telefax [phone]  Subjective:    Patient ID: [name], male    DOB: 1940/10/12, 76 y.o.   MRN: [id number]  HPI  This nice 76 yo MWM with HTN, HLD, ASHD/cAfib, s/p PYKD/XIPJ(8250),NLZJQBH Diastolic HF, presents for routine Coumadin surveillance and has been doing well denying c/o unusual bruising or bleeding. Likewise HT / CV systems review is negative  Buttock abscess apparently resolved. Today, he reports soreness of the Lt 1st MTP jt. He admits sugars ar running betw 180's - low 300's.    Patient's DM has not been at goal with A1c 9.6% in June. Patient admits gluttony eating.  Medication Sig  . acetaminophen (TYLENOL) 500 MG tablet Take 500-1,000 mg by mouth every 6 (six) hours as needed (pain).  [name] allopurinol (ZYLOPRIM) 100 MG tablet Take 100 mg by mouth daily. Takes 2.5 tablets daily  . ALPRAZolam (XANAX) 1 MG tablet Take 0.5 tablets (0.5 mg total) by mouth at bedtime as needed for sleep.  [name] Alum Hydroxide-Mag Carbonate (GAVISCON PO) Take 1 tablet by mouth daily as needed (acid reflux).  [name] atorvastatin (LIPITOR) 40 MG tablet Take 1 tablet (40 mg total) by mouth daily.  [name] azelastine (ASTELIN) 0.1 % nasal spray Place 2 sprays into both nostrils 2 (two) times daily. Use in each nostril as directed  . B Complex Vitamins (VITAMIN B COMPLEX PO) Take 1 tablet by mouth at bedtime.   . bacitracin 500 UNIT/GM ointment Apply 1 application topically daily as needed for wound care.  . Blood Glucose Monitoring Suppl (ACCU-CHEK AVIVA PLUS) w/Device KIT Check blood sugar 1 time  daily  . carvedilol (COREG) 6.25 MG tablet TAKE 1 TABLET BY MOUTH TWO  TIMES DAILY  . Cholecalciferol (VITAMIN D-3) 5000 units TABS Take 5,000 Units by mouth at bedtime.  .  colchicine 0.6 MG tablet   . diphenhydrAMINE (BENADRYL) 25 MG tablet Take 25 mg by mouth at bedtime as needed for allergies.   . Dulaglutide (TRULICITY) 4.19 FX/9.0WI SOPN Inject 1 pen into the skin once a week.  . fluticasone (FLONASE) 50 MCG/ACT nasal spray Place 1 spray into both nostrils daily as needed for allergies.   [name] glipiZIDE (GLUCOTROL) 5 MG tablet Take 1 tablet (5 mg total) by mouth 3 (three) times daily with meals.  [name] glucose blood (ACCU-CHEK AVIVA PLUS) test strip CHECK BLOOD GLUCOSE 3 TIMES DAILY.  [name] HYDROcodone-acetaminophen (NORCO/VICODIN) 5-325 MG tablet Take 1-2 tablets by mouth every 6 (six) hours as needed for moderate pain.   [name] lidocaine (LIDODERM) 5 % Place 1 patch onto the skin daily as needed (pain). Remove & Discard patch within 12 hours or as directed by MD  . loratadine (CLARITIN) 10 MG tablet Take 10 mg by mouth daily.  . metFORMIN (GLUCOPHAGE-XR) 500 MG 24 hr tablet TAKE 2 TABLETS BY MOUTH TWO TIMES DAILY  . metolazone (ZAROXOLYN) 5 MG tablet Take 5 mg by mouth daily as needed (weight gain and edema).  . Multiple Vitamin (MULTIVITAMIN WITH MINERALS) TABS tablet Take 1 tablet by mouth at bedtime.   . Multiple Vitamins-Minerals (PRESERVISION AREDS 2) CAPS Take 1 capsule by mouth  2 (two) times daily.   . polyvinyl alcohol (ARTIFICIAL TEARS) 1.4 % ophthalmic solution Place 1 drop into both eyes daily as needed for dry eyes.  . potassium chloride SA (K-DUR,KLOR-CON) 20 MEQ tablet Take 1 tablet (20 mEq total) by mouth 2 (two) times daily.  [name] PRESCRIPTION MEDICATION Inhale into the lungs at bedtime. CPAP  . ranitidine (ZANTAC) 150 MG tablet Take 150 mg by mouth daily as needed for heartburn.   . sertraline (ZOLOFT) 100 MG tablet TAKE 1 TABLET BY MOUTH DAILY  . torsemide (DEMADEX) 100 MG tablet Take 100 mg in AM and 50 mg in PM if needed for weight gain  . valsartan (DIOVAN) 320 MG tablet TAKE 0.5 TABLETS (160 MG TOTAL) BY MOUTH DAILY.  [name] warfarin (COUMADIN) 5 MG tablet TAKE  1 TABLET BY MOUTH TWICE A DAY (Patient taking differently: takes 2 tablets daily)   No facility-administered medications prior to visit.     Allergies  Allergen Reactions  . Sunflower Seed [Sunflower Oil] Swelling and Other (See Comments)    Tongue and lip swelling  . Horse-Derived Products Other (See Comments)    Per allergy skin test  . Tetanus Toxoids Other (See Comments)    Per allergy skin test   Past Medical History:  Diagnosis Date  . Adrenal adenoma   . Anxiety   . Atypical atrial flutter ([place]) 8/15, 10/15   a. DCCV 08/2013. b. s/p RFA 10/2013.  [name] Basal cell carcinoma   . CAD (coronary artery disease)    a. 04/2013 CABG x 2: LIMA to LAD, SVG to RI, EVH via R thigh.  . Cellulitis 12/2015   left leg  . Chronic diastolic congestive heart failure ([place])   . CKD (chronic kidney disease), stage III   . Depression   . Diabetes mellitus type II   . Diverticulosis 2001  . DJD (degenerative joint disease)   . GERD (gastroesophageal reflux disease)   . H/O hiatal hernia   . History of cardioversion    x3 (years uncertain)  . Hx of adenomatous colonic polyps   . Hyperlipidemia   . Hypertension   . Hypertensive cardiomyopathy ([place])   . Obstructive sleep apnea    compliant with CPAP  . 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 Persistent atrial fibrillation ([place])    a. s/p MAZE 04/2013 in setting of CABG. b. Amio stopped in 10/2013 after flutter ablation.  [name] PFO (patent foramen ovale)    a. Small PFO by TEE 10/2013.  [name] Pleural effusion, left    a. s/p thoracentesis 05/2013.  [name] Respiratory failure ([place])    a. Hypoxia 10/2013 - required supp O2 as inpatient, did not require it at discharge.  . S/P Maze operation for atrial fibrillation    a. 04/2013: Complete bilateral atrial lesion set using cryothermy and bipolar radiofrequency ablation with clipping of LA appendage (@  time of CABG)  . S/P Maze operation for atrial fibrillation 04/05/2013   Complete bilateral atrial lesion set using cryothermy and bipolar radiofrequency ablation with clipping of LA appendage via median sternotomy approach    Review of Systems  10 point systems review negative except as above.    Objective:   Physical Exam  BP 92/74   Pulse 80   Temp 98.1 F (36.7 C)   Resp 18   Ht 5' 11"  (1.803 m)   Wt 187 lb 12.8 oz (  85.2 kg)   BMI 26.19 kg/m   HEENT - WNL. Neck - supple.  Chest - Clear equal BS. Cor - Nl HS. RRR w/o sig MGR. PP 1(+). No edema. MS- FROM w/o deformities.  Gait Nl. Sl tender Lt 1st MTP jt.  Neuro -  Nl w/o focal abnormalities.    Assessment & Plan:   1. Essential hypertension  - CBC with Differential/Platelet - BASIC METABOLIC PANEL WITH GFR  2. Coronary artery disease involving native coronary artery of native heart without angina pectoris   3. Acute on chronic diastolic CHF (congestive heart failure), NYHA class 3 (HCC)  - BASIC METABOLIC PANEL WITH GFR  4. PAF (paroxysmal atrial fibrillation) ([place])   5. Gout, rule out  - Uric acid  6. Controlled type 2 diabetes mellitus with stage 3 chronic kidney disease, without long-term current use of insulin (HCC) - poor control   - BASIC METABOLIC PANEL WITH GFR - Fructosamine  7. Chronic anticoagulation  - CBC with Differential/Platelet - Protime-INR  8. Medication management  - CBC with Differential/Platelet - Protime-INR - BASIC METABOLIC PANEL WITH GFR - Fructosamine

## 2016-09-03 NOTE — Patient Instructions (Signed)
Bleeding Precautions When on Anticoagulant Therapy  WHAT IS ANTICOAGULANT THERAPY?  Anticoagulant therapy is taking medicine to prevent or reduce blood clots. It is also called blood thinner therapy. Blood clots that form in your blood vessels can be dangerous. They can break loose and travel to your heart, lungs, or brain. This increases your risk of a heart attack or stroke. Anticoagulant therapy causes blood to clot more slowly.  You may need anticoagulant therapy if you have:   A medical condition that increases the likelihood that blood clots will form.   A heart defect or a problem with heart rhythm.  It is also a common treatment after heart surgery, such as valve replacement.  WHAT ARE COMMON TYPES OF ANTICOAGULANT THERAPY?  Anticoagulant medicine can be injected or taken by mouth.If you need anticoagulant therapy quickly at the hospital, the medicine may be injected under your skin or given through an IV tube. Heparin is a common example of an anticoagulant that you may get at the hospital.  Most anticoagulant therapy is in the form of pills that you take at home every day. These may include:   Aspirin. This common blood thinner works by preventing blood cells (platelets) from sticking together to form a clot. Aspirin is not as strong as anticoagulants that slow down the time that it takes for your body to form a clot.   Clopidogrel. This is a newer type of drug that affects platelets. It is stronger than aspirin.   Warfarin. This is the most common anticoagulant. It changes the way your body uses vitamin K, a vitamin that helps your blood to clot. The risk of bleeding is higher with warfarin than with aspirin. You will need frequent blood tests to make sure you are taking the safest amount.   New anticoagulants. Several new drugs have been approved. They are all taken by mouth. Studies show that these drugs work as well as warfarin. They do not require blood testing. They may cause less bleeding  risk than warfarin.  WHAT DO I NEED TO REMEMBER WHEN TAKING ANTICOAGULANT THERAPY?  Anticoagulant therapy decreases your risk of forming a blood clot, but it increases your risk of bleeding. Work closely with your health care provider to make sure you are taking your medicine safely. These tips can help:   Learn ways to reduce your risk of bleeding.   If you are taking warfarin:  ? Have blood tests as ordered by your health care provider.  ? Do not make any sudden changes to your diet. Vitamin K in your diet can make warfarin less effective.  ? Do not get pregnant. This medicine may cause birth defects.   Take your medicine at the same time every day. If you forget to take your medicine, take it as soon as you remember. If you miss a whole day, do not double your dose of medicine. Take your normal dose and call your health care provider to check in.   Do not stop taking your medicine on your own.   Tell your health care provider before you start taking any new medicine, vitamin, or herbal product. Some of these could interfere with your therapy.   Tell all of your health care providers that you are on anticoagulant therapy.   Do not have surgery, medical procedures, or dental work until you tell your health care provider that you are on anticoagulant therapy.  WHAT CAN AFFECT HOW ANTICOAGULANTS WORK?  Certain foods, vitamins, medicines, supplements, and herbal   medicines change the way that anticoagulant therapy works. They may increase or decrease the effects of your anticoagulant therapy. Either result can be dangerous for you.   Many over-the-counter medicines for pain, colds, or stomach problems interfere with anticoagulant therapy. Take these only as told by your health care provider.   Do not drink alcohol. It can interfere with your medicine and increase your risk of an injury that causes bleeding.   If you are taking warfarin, do not begin eating more foods that contain vitamin K. These include  leafy green vegetables. Ask your health care provider if you should avoid any foods.  WHAT ARE SOME WAYS TO PREVENT BLEEDING?  You can prevent bleeding by taking certain precautions:   Be extra careful when you use knives, scissors, or other sharp objects.   Use an electric razor instead of a blade.   Do not use toothpicks.   Use a soft toothbrush.   Wear shoes that have nonskid soles.   Use bath mats and handrails in your bathroom.   Wear gloves while you do yard work.   Wear a helmet when you ride a bike.   Wear your seat belt.   Prevent falls by removing loose rugs and extension cords from areas where you walk.   Do not play contact sports or participate in other activities that have a high risk of injury.  WHEN SHOULD I CONTACT MY HEALTH CARE PROVIDER?  Call your health care provider if:   You miss a dose of medicine:  ? And you are not sure what to do.  ? For more than one day.   You have:  ? Menstrual bleeding that is heavier than normal.  ? Blood in your urine.  ? A bloody nose or bleeding gums.  ? Easy bruising.  ? Blood in your stool (feces) or have black and tarry stool.  ? Side effects from your medicine.   You feel weak or dizzy.   You become pregnant.  Seek immediate medical care if:   You have bleeding that will not stop.   You have sudden and severe headache or belly pain.   You vomit or you cough up bright red blood.   You have a severe blow to your head.  WHAT ARE SOME QUESTIONS TO ASK MY HEALTH CARE PROVIDER?   What is the best anticoagulant therapy for my condition?   What side effects should I watch for?   When should I take my medicine? What should I do if I forget to take it?   Will I need to have regular blood tests?   Do I need to change my diet? Are there foods or drinks that I should avoid?   What activities are safe for me?   What should I do if I want to get pregnant?  This information is not intended to replace advice given to you by your health care provider.  Make sure you discuss any questions you have with your health care provider.  Document Released: 12/03/2014 Document Reviewed: 12/03/2014  Elsevier Interactive Patient Education  2017 [employer]

## 2016-09-04 ENCOUNTER — Encounter: Payer: Self-pay | Admitting: Internal Medicine

## 2016-09-04 ENCOUNTER — Ambulatory Visit (INDEPENDENT_AMBULATORY_CARE_PROVIDER_SITE_OTHER): Payer: Medicare Other | Admitting: Internal Medicine

## 2016-09-04 ENCOUNTER — Ambulatory Visit: Payer: Self-pay | Admitting: Physician Assistant

## 2016-09-04 VITALS — BP 92/74 | HR 80 | Temp 98.1°F | Resp 18 | Ht 71.0 in | Wt 187.8 lb

## 2016-09-04 DIAGNOSIS — Z7901 Long term (current) use of anticoagulants: Secondary | ICD-10-CM

## 2016-09-04 DIAGNOSIS — E1122 Type 2 diabetes mellitus with diabetic chronic kidney disease: Secondary | ICD-10-CM | POA: Diagnosis not present

## 2016-09-04 DIAGNOSIS — I10 Essential (primary) hypertension: Secondary | ICD-10-CM | POA: Diagnosis not present

## 2016-09-04 DIAGNOSIS — M109 Gout, unspecified: Secondary | ICD-10-CM | POA: Diagnosis not present

## 2016-09-04 DIAGNOSIS — I480 Paroxysmal atrial fibrillation: Secondary | ICD-10-CM | POA: Diagnosis not present

## 2016-09-04 DIAGNOSIS — N183 Chronic kidney disease, stage 3 (moderate): Secondary | ICD-10-CM | POA: Diagnosis not present

## 2016-09-04 DIAGNOSIS — I5033 Acute on chronic diastolic (congestive) heart failure: Secondary | ICD-10-CM

## 2016-09-04 DIAGNOSIS — I2510 Atherosclerotic heart disease of native coronary artery without angina pectoris: Secondary | ICD-10-CM | POA: Diagnosis not present

## 2016-09-04 DIAGNOSIS — Z79899 Other long term (current) drug therapy: Secondary | ICD-10-CM

## 2016-09-04 LAB — URIC ACID: URIC ACID, SERUM: 7.9 mg/dL (ref 4.0–8.0)

## 2016-09-05 ENCOUNTER — Other Ambulatory Visit: Payer: Self-pay | Admitting: Internal Medicine

## 2016-09-05 ENCOUNTER — Encounter: Payer: Self-pay | Admitting: Internal Medicine

## 2016-09-05 DIAGNOSIS — I480 Paroxysmal atrial fibrillation: Secondary | ICD-10-CM

## 2016-09-05 DIAGNOSIS — Z7901 Long term (current) use of anticoagulants: Secondary | ICD-10-CM

## 2016-09-05 DIAGNOSIS — Z79899 Other long term (current) drug therapy: Secondary | ICD-10-CM

## 2016-09-05 DIAGNOSIS — N289 Disorder of kidney and ureter, unspecified: Secondary | ICD-10-CM

## 2016-09-05 DIAGNOSIS — M109 Gout, unspecified: Secondary | ICD-10-CM

## 2016-09-05 MED ORDER — ALLOPURINOL 300 MG PO TABS: ORAL_TABLET | ORAL | 1 refills | Status: DC

## 2016-09-08 ENCOUNTER — Other Ambulatory Visit: Payer: Self-pay | Admitting: *Deleted

## 2016-09-08 DIAGNOSIS — I5032 Chronic diastolic (congestive) heart failure: Secondary | ICD-10-CM

## 2016-09-08 LAB — BASIC METABOLIC PANEL WITH GFR
BUN / CREAT RATIO: 18 (calc) (ref 6–22)
BUN: 52 mg/dL — AB (ref 7–25)
CALCIUM: 9.5 mg/dL (ref 8.6–10.3)
CO2: 25 mmol/L (ref 20–32)
Chloride: 92 mmol/L — ABNORMAL LOW (ref 98–110)
Creat: 2.84 mg/dL — ABNORMAL HIGH (ref 0.70–1.18)
GFR, EST AFRICAN AMERICAN: 24 mL/min/{1.73_m2} — AB (ref >=60)
GFR, EST NON AFRICAN AMERICAN: 20 mL/min/{1.73_m2} — AB (ref >=60)
Glucose, Bld: 383 mg/dL — ABNORMAL HIGH (ref 65–99)
Potassium: 3.8 mmol/L (ref 3.5–5.3)
SODIUM: 136 mmol/L (ref 135–146)

## 2016-09-08 LAB — CBC WITH DIFFERENTIAL/PLATELET
BASOS ABS: 49 {cells}/uL (ref 0–200)
BASOS PCT: 0.5 %
EOS ABS: 88 {cells}/uL (ref 15–500)
Eosinophils Relative: 0.9 %
HEMATOCRIT: 35 % — AB (ref 38.5–50.0)
Hemoglobin: 11.7 g/dL — ABNORMAL LOW (ref 13.2–17.1)
LYMPHS ABS: 764 {cells}/uL — AB (ref 850–3900)
MCH: 31.6 pg (ref 27.0–33.0)
MCHC: 33.4 g/dL (ref 32.0–36.0)
MCV: 94.6 fL (ref 80.0–100.0)
MPV: 9.1 fL (ref 7.5–12.5)
Monocytes Relative: 6.3 %
Neutro Abs: 8281 cells/uL — ABNORMAL HIGH (ref 1500–7800)
Neutrophils Relative %: 84.5 %
Platelets: 209 10*3/uL (ref 140–400)
RBC: 3.7 10*6/uL — ABNORMAL LOW (ref 4.20–5.80)
RDW: 13.7 % (ref 11.0–15.0)
Total Lymphocyte: 7.8 %
WBC mixed population: 617 cells/uL (ref 200–950)
WBC: 9.8 10*3/uL (ref 3.8–10.8)

## 2016-09-08 LAB — FRUCTOSAMINE: Fructosamine: 589 umol/L — ABNORMAL HIGH (ref 190–270)

## 2016-09-08 LAB — PROTIME-INR
INR: 3.1 — ABNORMAL HIGH
Prothrombin Time: 32.4 s — ABNORMAL HIGH (ref 9.0–11.5)

## 2016-09-08 MED ORDER — POTASSIUM CHLORIDE CRYS ER 20 MEQ PO TBCR: 20.0000 meq | EXTENDED_RELEASE_TABLET | Freq: Two times a day (BID) | ORAL | 4 refills | Status: DC

## 2016-09-14 DIAGNOSIS — M1990 Unspecified osteoarthritis, unspecified site: Secondary | ICD-10-CM | POA: Diagnosis not present

## 2016-09-14 DIAGNOSIS — E790 Hyperuricemia without signs of inflammatory arthritis and tophaceous disease: Secondary | ICD-10-CM | POA: Diagnosis not present

## 2016-09-14 DIAGNOSIS — N189 Chronic kidney disease, unspecified: Secondary | ICD-10-CM | POA: Diagnosis not present

## 2016-09-14 DIAGNOSIS — M109 Gout, unspecified: Secondary | ICD-10-CM | POA: Diagnosis not present

## 2016-09-15 ENCOUNTER — Other Ambulatory Visit: Payer: Self-pay | Admitting: Internal Medicine

## 2016-09-15 MED ORDER — ATORVASTATIN CALCIUM 40 MG PO TABS: 40.0000 mg | ORAL_TABLET | Freq: Every day | ORAL | 3 refills | Status: DC

## 2016-09-30 ENCOUNTER — Encounter: Payer: Self-pay | Admitting: Internal Medicine

## 2016-10-04 NOTE — Progress Notes (Signed)
This very nice 76 y.o. MWM presents for 6 month follow up with Hypertension, ASHD/pAfib, Pulm HTN,  Hyperlipidemia, Pre-Diabetes and Vitamin D Deficiency. Patient has OSA & is on CPAP with improved restorative sleep and daytime alertness. He also has hx/o Gout apparently controlled with his Allopurinol.      Patient is treated for HTN (1980)  & BP has been controlled. And today's BP is at goal - 112/64.  In 2015, he underwent CABG & MAZE procedure. He's also had RF Ablation for Afib and is on chronic Coumadin. He's followed by Dr Missy Sabins in the HF clinic. 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  He does admit exertional dyspnea with moderate activity.  Patient has had no complaints of any cardiac type chest pain, palpitations, orthopnea/PND, dizziness, claudication, or dependent edema.     Hyperlipidemia is controlled with diet & meds. Patient denies myalgias or other med SE's. 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 He reports CBG averages are in the 250-280's range on his current Metformin and Glipizide 5 mg tid. He is aware he will to taper or d/c Metformin with a continued downward trend with GFR. Last A1c was not at goal:  Lab Results  Component Value Date   HGBA1C 9.6 (H) 07/01/2016      Further, the patient also has history of Vitamin D Deficiency  ("39" in 2008) and supplements vitamin D without any suspected side-effects. Last vitamin D was near goal (70-100):   Lab Results  Component Value Date   VD25OH 61 07/01/2016   Current Outpatient Prescriptions on File Prior to Visit  Medication Sig  . acetaminophen  (TYLENOL) 500 MG tablet Take 500-1,000 mg by mouth every 6 (six) hours as needed (pain).  [name] allopurinol (ZYLOPRIM) 300 MG tablet Take 1 tablet daily to prevent gout  . ALPRAZolam (XANAX) 1 MG tablet Take 0.5 tablets (0.5 mg total) by mouth at bedtime as needed for sleep.  [name] Alum Hydroxide-Mag Carbonate (GAVISCON PO) Take 1 tablet by mouth daily as needed (acid reflux).  [name] atorvastatin (LIPITOR) 40 MG tablet Take 1 tablet (40 mg total) by mouth daily.  [name] azelastine (ASTELIN) 0.1 % nasal spray Place 2 sprays into both nostrils 2 (two) times daily. Use in each nostril as directed  . B Complex Vitamins (VITAMIN B COMPLEX PO) Take 1 tablet by mouth at bedtime.   . bacitracin 500 UNIT/GM ointment Apply 1 application topically daily as needed for wound care.  . Blood Glucose Monitoring Suppl (ACCU-CHEK AVIVA PLUS) w/Device KIT Check blood sugar 1 time  daily  . carvedilol (COREG) 6.25 MG tablet TAKE 1 TABLET BY MOUTH TWO  TIMES DAILY  . Cholecalciferol (VITAMIN D-3) 5000 units TABS Take 5,000 Units by mouth at bedtime.  . colchicine 0.6 MG tablet   . diphenhydrAMINE (BENADRYL) 25 MG tablet Take 25 mg by mouth at bedtime as needed for allergies.   . fluticasone (FLONASE) 50 MCG/ACT nasal spray Place 1 spray into both nostrils daily as needed for allergies.   [name] glipiZIDE (GLUCOTROL) 5 MG tablet Take  1 tablet (5 mg total) by mouth 3 (three) times daily with meals.  [name] glucose blood (ACCU-CHEK AVIVA PLUS) test strip CHECK BLOOD GLUCOSE 3 TIMES DAILY.  [name] HYDROcodone-acetaminophen (NORCO/VICODIN) 5-325 MG tablet Take 1-2 tablets by mouth every 6 (six) hours as needed for moderate pain.   [name] lidocaine (LIDODERM) 5 % Place 1 patch onto the skin daily as needed (pain). Remove & Discard patch within 12 hours or as directed by MD  . loratadine (CLARITIN) 10 MG tablet Take 10 mg by mouth daily.  . metFORMIN (GLUCOPHAGE-XR) 500 MG 24 hr tablet TAKE 2 TABLETS BY MOUTH TWO TIMES DAILY  . metolazone (ZAROXOLYN) 5 MG  tablet Take 5 mg by mouth daily as needed (weight gain and edema).  . Multiple Vitamin (MULTIVITAMIN WITH MINERALS) TABS tablet Take 1 tablet by mouth at bedtime.   . Multiple Vitamins-Minerals (PRESERVISION AREDS 2) CAPS Take 1 capsule by mouth 2 (two) times daily.   . polyvinyl alcohol (ARTIFICIAL TEARS) 1.4 % ophthalmic solution Place 1 drop into both eyes daily as needed for dry eyes.  . potassium chloride SA (K-DUR,KLOR-CON) 20 MEQ tablet Take 1 tablet (20 mEq total) by mouth 2 (two) times daily.  [name] PRESCRIPTION MEDICATION Inhale into the lungs at bedtime. CPAP  . ranitidine (ZANTAC) 150 MG tablet Take 150 mg by mouth daily as needed for heartburn.   . sertraline (ZOLOFT) 100 MG tablet TAKE 1 TABLET BY MOUTH DAILY  . torsemide (DEMADEX) 100 MG tablet Take 100 mg in AM and 50 mg in PM if needed for weight gain  . valsartan (DIOVAN) 320 MG tablet TAKE 0.5 TABLETS (160 MG TOTAL) BY MOUTH DAILY.  [name] warfarin (COUMADIN) 5 MG tablet TAKE 1 TABLET BY MOUTH TWICE A DAY (Patient taking differently: takes 2 tablets daily)   No current facility-administered medications on file prior to visit.    Allergies  Allergen Reactions  . Sunflower Seed [Sunflower Oil] Swelling and Other (See Comments)    Tongue and lip swelling  . Horse-Derived Products Other (See Comments)    Per allergy skin test  . Tetanus Toxoids Other (See Comments)    Per allergy skin test   PMHx:   Past Medical History:  Diagnosis Date  . Adrenal adenoma   . Anxiety   . Atypical atrial flutter ([place]) 8/15, 10/15   a. DCCV 08/2013. b. s/p RFA 10/2013.  [name] Basal cell carcinoma   . CAD (coronary artery disease)    a. 04/2013 CABG x 2: LIMA to LAD, SVG to RI, EVH via R thigh.  . Cellulitis 12/2015   left leg  . Chronic diastolic congestive heart failure ([place])   . CKD (chronic kidney disease), stage III   . Depression   . Diabetes mellitus type II   . Diverticulosis 2001  . DJD (degenerative joint disease)   . GERD  (gastroesophageal reflux disease)   . H/O hiatal hernia   . History of cardioversion    x3 (years uncertain)  . Hx of adenomatous colonic polyps   . Hyperlipidemia   . Hypertension   . Hypertensive cardiomyopathy ([place])   . Obstructive sleep apnea    compliant with CPAP  . 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 Persistent atrial fibrillation ([place])    a. s/p MAZE 04/2013 in setting of CABG. b. Amio stopped in 10/2013 after flutter ablation.  [name] PFO (patent foramen ovale)  a. Small PFO by TEE 10/2013.  [name] Pleural effusion, left    a. s/p thoracentesis 05/2013.  [name] Respiratory failure ([place])    a. Hypoxia 10/2013 - required supp O2 as inpatient, did not require it at discharge.  . S/P Maze operation for atrial fibrillation    a. 04/2013: Complete bilateral atrial lesion set using cryothermy and bipolar radiofrequency ablation with clipping of LA appendage (@ time of CABG)  . S/P Maze operation for atrial fibrillation 04/05/2013   Complete bilateral atrial lesion set using cryothermy and bipolar radiofrequency ablation with clipping of LA appendage via median sternotomy approach    Immunization History  Administered Date(s) Administered  . DT 08/05/2015  . Influenza Split 10/25/2012  . Influenza, High Dose Seasonal PF 09/26/2013, 09/27/2014, 10/03/2015  . Pneumococcal Conjugate-13 01/30/2014  . Pneumococcal Polysaccharide-23 10/20/2011  . Td 01/06/2000   Past Surgical History:  Procedure Laterality Date  . ATRIAL FIBRILLATION ABLATION N/A 10/26/2013   Procedure: ATRIAL FIBRILLATION ABLATION;  Surgeon: Coralyn Mark, MD;  Location: [facility] CATH LAB;  Service: Cardiovascular;  Laterality: N/A;  . BASAL CELL CARCINOMA EXCISION     x3 on face  . CARDIAC CATHETERIZATION     myocardial bridge but no cad  . CARDIOVERSION N/A 08/23/2013   Procedure: CARDIOVERSION;  Surgeon: Sanda Klein,  MD;  Location: [facility];  Service: Cardiovascular;  Laterality: N/A;  . CARPOMETACARPEL SUSPENSION PLASTY Left 02/14/2014   Procedure: CARPOMETACARPEL ([place]) SUSPENSIONPLASTY THUMB  WITH  ABDUCTOR POLLICIS LONGUS TRANSFER AND STENOSING TENOSYNOVITIS RELEASE LEFT WRIST;  Surgeon: Charlotte Crumb, MD;  Location: [facility];  Service: Orthopedics;  Laterality: Left;  . CATARACT EXTRACTION     bilateral  . CORONARY ARTERY BYPASS GRAFT N/A 04/05/2013   Procedure: CORONARY ARTERY BYPASS GRAFTING (CABG) TIMES TWO USING LEFT INTERNAL MAMMARY ARTERY AND RIGHT SAPHENOUS LEG VEIN HARVESTED ENDOSCOPICALLY;  Surgeon: Rexene Alberts, MD;  Location: [facility];  Service: Open Heart Surgery;  Laterality: N/A;  . GREAT TOE ARTHRODESIS, INTERPHALANGEAL JOINT     Right foot  . INTRAOPERATIVE TRANSESOPHAGEAL ECHOCARDIOGRAM N/A 04/05/2013   Procedure: INTRAOPERATIVE TRANSESOPHAGEAL ECHOCARDIOGRAM;  Surgeon: Rexene Alberts, MD;  Location: [facility];  Service: Open Heart Surgery;  Laterality: N/A;  . LEFT HEART CATHETERIZATION WITH CORONARY ANGIOGRAM N/A 03/07/2013   Procedure: LEFT HEART CATHETERIZATION WITH CORONARY ANGIOGRAM;  Surgeon: Burnell Blanks, MD;  Location: [facility] CATH LAB;  Service: Cardiovascular;  Laterality: N/A;  . MAZE N/A 04/05/2013   Procedure: MAZE;  Surgeon: Rexene Alberts, MD;  Location: [facility];  Service: Open Heart Surgery;  Laterality: N/A;  . Polinydal cyst     Removed  . POLYPECTOMY    . Retina repair-right    . RIGHT HEART CATHETERIZATION N/A 05/03/2014   Procedure: RIGHT HEART CATH;  Surgeon: Jolaine Artist, MD;  Location: [facility] CATH LAB;  Service: Cardiovascular;  Laterality: N/A;  . TEE WITHOUT CARDIOVERSION N/A 08/23/2013   Procedure: TRANSESOPHAGEAL ECHOCARDIOGRAM (TEE);  Surgeon: Sanda Klein, MD;  Location: [facility] ENDOSCOPY;  Service: Cardiovascular;  Laterality: N/A;  . TEE WITHOUT CARDIOVERSION N/A 10/26/2013   Procedure: TRANSESOPHAGEAL ECHOCARDIOGRAM (TEE);  Surgeon: Sueanne Margarita, MD;  Location: [facility] ENDOSCOPY;  Service: Cardiovascular;  Laterality: N/A;  . TEE WITHOUT CARDIOVERSION N/A 06/05/2014   Procedure: TRANSESOPHAGEAL ECHOCARDIOGRAM (TEE);  Surgeon: Thayer Headings, MD;  Location: [facility];  Service: Cardiovascular;  Laterality: N/A;  . TRAPEZIUM RESECTION     FHx:    Reviewed / unchanged  SHx:  Reviewed / unchanged  Systems Review:  Constitutional: Denies fever, chills, wt changes, headaches, insomnia, fatigue, night sweats, change in appetite. Eyes: Denies redness, blurred vision, diplopia, discharge, itchy, watery eyes.  ENT: Denies discharge, congestion, post nasal drip, epistaxis, sore throat, earache, hearing loss, dental pain, tinnitus, vertigo, sinus pain, snoring.  CV: Denies chest pain, palpitations, irregular heartbeat, syncope, dyspnea, diaphoresis, orthopnea, PND, claudication or edema. Respiratory: denies cough, dyspnea, DOE, pleurisy, hoarseness, laryngitis, wheezing.  Gastrointestinal: Denies dysphagia, odynophagia, heartburn, reflux, water brash, abdominal pain or cramps, nausea, vomiting, bloating, diarrhea, constipation, hematemesis, melena, hematochezia  or hemorrhoids. Genitourinary: Denies dysuria, frequency, urgency, nocturia, hesitancy, discharge, hematuria or flank pain. Musculoskeletal: Denies arthralgias, myalgias, stiffness, jt. swelling, pain, limping or strain/sprain.  Skin: Denies pruritus, rash, hives, warts, acne, eczema or change in skin lesion(s). Neuro: No weakness, tremor, incoordination, spasms, paresthesia or pain. Psychiatric: Denies confusion, memory loss or sensory loss. Endo: Denies change in weight, skin or hair change.  Heme/Lymph: No excessive bleeding, bruising or enlarged lymph nodes.  Physical Exam  BP 112/64   Pulse 76   Temp (!) 97.5 F (36.4 C)   Resp 18   Ht _0  (1.803 m)   Wt 190 lb 3.2 oz (86.3 kg)   BMI 26.53 kg/m   Appears well nourished, well groomed  and in no  distress.  Eyes: PERRLA, EOMs, conjunctiva no swelling or erythema. Sinuses: No frontal/maxillary tenderness ENT/Mouth: EAC's clear, TM's nl w/o erythema, bulging. Nares clear w/o erythema, swelling, exudates. Oropharynx clear without erythema or exudates. Oral hygiene is good. Tongue normal, non obstructing. Hearing intact.  Neck: Supple. Thyroid nl. Car 2+/2+ without bruits, nodes or JVD. Chest: Respirations nl with BS clear & equal w/o rales, rhonchi, wheezing or stridor.  Cor: Heart sounds normal w/ regular rate and rhythm without sig. murmurs, gallops, clicks or rubs. Peripheral pulses normal and equal  without edema.  Abdomen: Soft & bowel sounds normal. Non-tender w/o guarding, rebound, hernias, masses or organomegaly.  Lymphatics: Unremarkable.  Musculoskeletal: Full ROM all peripheral extremities, joint stability, 5/5 strength and normal gait.  Skin: Warm, dry without exposed rashes, lesions or ecchymosis apparent.  Neuro: Cranial nerves intact, reflexes equal bilaterally. Sensory-motor testing grossly intact. Tendon reflexes grossly intact.  Pysch: Alert & oriented x 3.  Insight and judgement nl & appropriate. No ideations.  Assessment and Plan:  1. Essential hypertension  - Continue medication, monitor blood pressure at home.  - Continue DASH diet. Reminder to go to the ER if any CP,  SOB, nausea, dizziness, severe HA, changes vision/speech.  - CBC with Differential/Platelet - BASIC METABOLIC PANEL WITH GFR - Magnesium - TSH - DG Chest 2 View; Future  2. Hyperlipidemia, mixed  - Continue diet/meds, exercise,& lifestyle modifications.  - Continue monitor periodic cholesterol/liver & renal functions   - Hepatic function panel - Lipid panel - TSH  3. Controlled type 2 diabetes mellitus with stage 4 chronic kidney disease, without long-term current use of insulin (HCC)  - Continue diet, exercise, lifestyle modifications.  - Monitor appropriate labs.  - Hemoglobin  A1c - Insulin, random  4. Vitamin D deficiency  - Continue supplementation.  - VITAMIN D 25 Hydroxy   5. Gout  - Uric acid  6. PAF (paroxysmal atrial fibrillation) ([place])  - Protime-INR  7. Chronic anticoagulation  - Protime-INR  8. Medication management  - CBC with Differential/Platelet - BASIC METABOLIC PANEL WITH GFR - Hepatic function panel - Magnesium - Lipid panel - TSH - Hemoglobin A1c -  Insulin, random - VITAMIN D 25 Hydroxy  - Uric acid  9. Controlled type 2 diabetes mellitus with stage 3/4 chronic kidney disease, without long-term current use of insulin (HCC)  - increase from Glipizide 5 mg to   - glipiZIDE 10 MG tab; Take 1 tab 3 x/ day before meals  Disp: 270 tab; Refill: 1  - may need to d/c Metformin and switch to Novolin 70/30  Pending renal functions.  10. Need for immunization against influenza  - Flu vaccine HIGH DOSE PF (Fluzone High dose)       Discussed  regular exercise, BP monitoring, weight control to achieve/maintain BMI less than 25 and discussed med and SE's. Recommended labs to assess and monitor clinical status with further disposition pending results of labs. Over 30 minutes of exam, counseling, chart review was performed.

## 2016-10-04 NOTE — Patient Instructions (Signed)
Bleeding Precautions When on Anticoagulant Therapy  WHAT IS ANTICOAGULANT THERAPY? Anticoagulant therapy is taking medicine to prevent or reduce blood clots. It is also called blood thinner therapy. Blood clots that form in your blood vessels can be dangerous. They can break loose and travel to your heart, lungs, or brain. This increases your risk of a heart attack or stroke. Anticoagulant therapy causes blood to clot more slowly. You may need anticoagulant therapy if you have:  A medical condition that increases the likelihood that blood clots will form.  A heart defect or a problem with heart rhythm. It is also a common treatment after heart surgery, such as valve replacement. WHAT ARE COMMON TYPES OF ANTICOAGULANT THERAPY? Anticoagulant medicine can be injected or taken by mouth.If you need anticoagulant therapy quickly at the hospital, the medicine may be injected under your skin or given through an IV tube. Heparin is a common example of an anticoagulant that you may get at the hospital. Most anticoagulant therapy is in the form of pills that you take at home every day. These may include:  Aspirin. This common blood thinner works by preventing blood cells (platelets) from sticking together to form a clot. Aspirin is not as strong as anticoagulants that slow down the time that it takes for your body to form a clot.  Clopidogrel. This is a newer type of drug that affects platelets. It is stronger than aspirin.  Warfarin. This is the most common anticoagulant. It changes the way your body uses vitamin K, a vitamin that helps your blood to clot. The risk of bleeding is higher with warfarin than with aspirin. You will need frequent blood tests to make sure you are taking the safest amount.  New anticoagulants. Several new drugs have been approved. They are all taken by mouth. Studies show that these drugs work as well as warfarin. They do not require blood testing. They may cause less bleeding  risk than warfarin. WHAT DO I NEED TO REMEMBER WHEN TAKING ANTICOAGULANT THERAPY? Anticoagulant therapy decreases your risk of forming a blood clot, but it increases your risk of bleeding. Work closely with your health care provider to make sure you are taking your medicine safely. These tips can help:  Learn ways to reduce your risk of bleeding.  If you are taking warfarin: ? Have blood tests as ordered by your health care provider. ? Do not make any sudden changes to your diet. Vitamin K in your diet can make warfarin less effective. ? Do not get pregnant. This medicine may cause birth defects.  Take your medicine at the same time every day. If you forget to take your medicine, take it as soon as you remember. If you miss a whole day, do not double your dose of medicine. Take your normal dose and call your health care provider to check in.  Do not stop taking your medicine on your own.  Tell your health care provider before you start taking any new medicine, vitamin, or herbal product. Some of these could interfere with your therapy.  Tell all of your health care providers that you are on anticoagulant therapy.  Do not have surgery, medical procedures, or dental work until you tell your health care provider that you are on anticoagulant therapy. WHAT CAN AFFECT HOW ANTICOAGULANTS WORK? Certain foods, vitamins, medicines, supplements, and herbal medicines change the way that anticoagulant therapy works. They may increase or decrease the effects of your anticoagulant therapy. Either result can be dangerous for you.    Many over-the-counter medicines for pain, colds, or stomach problems interfere with anticoagulant therapy. Take these only as told by your health care provider.  Do not drink alcohol. It can interfere with your medicine and increase your risk of an injury that causes bleeding.  If you are taking warfarin, do not begin eating more foods that contain vitamin K. These include  leafy green vegetables. Ask your health care provider if you should avoid any foods. WHAT ARE SOME WAYS TO PREVENT BLEEDING? You can prevent bleeding by taking certain precautions:  Be extra careful when you use knives, scissors, or other sharp objects.  Use an electric razor instead of a blade.  Do not use toothpicks.  Use a soft toothbrush.  Wear shoes that have nonskid soles.  Use bath mats and handrails in your bathroom.  Wear gloves while you do yard work.  Wear a helmet when you ride a bike.  Wear your seat belt.  Prevent falls by removing loose rugs and extension cords from areas where you walk.  Do not play contact sports or participate in other activities that have a high risk of injury. WHEN SHOULD I CONTACT MY HEALTH CARE PROVIDER? Call your health care provider if:  You miss a dose of medicine: ? And you are not sure what to do. ? For more than one day.  You have: ? Menstrual bleeding that is heavier than normal. ? Blood in your urine. ? A bloody nose or bleeding gums. ? Easy bruising. ? Blood in your stool (feces) or have black and tarry stool. ? Side effects from your medicine.  You feel weak or dizzy.  You become pregnant. Seek immediate medical care if:  You have bleeding that will not stop.  You have sudden and severe headache or belly pain.  You vomit or you cough up bright red blood.  You have a severe blow to your head. WHAT ARE SOME QUESTIONS TO ASK MY HEALTH CARE PROVIDER?  What is the best anticoagulant therapy for my condition?  What side effects should I watch for?  When should I take my medicine? What should I do if I forget to take it?  Will I need to have regular blood tests?  Do I need to change my diet? Are there foods or drinks that I should avoid?  What activities are safe for me?  What should I do if I want to get pregnant? This information is not intended to replace advice given to you by your health care provider.  Make sure you discuss any questions you have with your health care provider. Document Released: 12/03/2014 Document Reviewed: 12/03/2014 Elsevier Interactive Patient Education  2017 [employer]  ++++++++++++++++++++++++++++++++++ Recommend Adult Low Dose Aspirin or  coated  Aspirin 81 mg daily  To reduce risk of Colon Cancer 20 %,  Skin Cancer 26 % ,  Melanoma 46%  and  Pancreatic cancer 60% +++++++++++++++++++++++++ Vitamin D goal  is between 70-100.  Please make sure that you are taking your Vitamin D as directed.  It is very important as a natural anti-inflammatory  helping hair, skin, and nails, as well as reducing stroke and heart attack risk.  It helps your bones and helps with mood. It also decreases numerous cancer risks so please take it as directed.  Low Vit D is associated with a 200-300% higher risk for CANCER  and 200-300% higher risk for HEART   ATTACK  &  STROKE.   ...................................... It is also associated with   higher death rate at younger ages,  autoimmune diseases like Rheumatoid arthritis, Lupus, Multiple Sclerosis.    Also many other serious conditions, like depression, Alzheimer's Dementia, infertility, muscle aches, fatigue, fibromyalgia - just to name a few. ++++++++++++++++++++ Recommend the book "The END of DIETING" by Dr Joel Fuhrman  & the book "The END of DIABETES " by Dr Joel Fuhrman At [employer] - get book & Audio CD's    Being diabetic has a  300% increased risk for heart attack, stroke, cancer, and alzheimer- type vascular dementia. It is very important that you work harder with diet by avoiding all foods that are white. Avoid white rice (brown & wild rice is OK), white potatoes (sweetpotatoes in moderation is OK), White bread or wheat bread or anything made out of white flour like bagels, donuts, rolls, buns, biscuits, cakes, pastries, cookies, pizza crust, and pasta (made from white flour & egg whites) - vegetarian pasta or  spinach or wheat pasta is OK. Multigrain breads like Arnold's or [employer], or multigrain sandwich thins or flatbreads.  Diet, exercise and weight loss can reverse and cure diabetes in the early stages.  Diet, exercise and weight loss is very important in the control and prevention of complications of diabetes which affects every system in your body, ie. Brain - dementia/stroke, eyes - glaucoma/blindness, heart - heart attack/heart failure, kidneys - dialysis, stomach - gastric paralysis, intestines - malabsorption, nerves - severe painful neuritis, circulation - gangrene & loss of a leg(s), and finally cancer and Alzheimers.    I recommend avoid fried & greasy foods,  sweets/candy, white rice (brown or wild rice or Quinoa is OK), white potatoes (sweet potatoes are OK) - anything made from white flour - bagels, doughnuts, rolls, buns, biscuits,white and wheat breads, pizza crust and traditional pasta made of white flour & egg white(vegetarian pasta or spinach or wheat pasta is OK).  Multi-grain bread is OK - like multi-grain flat bread or sandwich thins. Avoid alcohol in excess. Exercise is also important.    Eat all the vegetables you want - avoid meat, especially red meat and dairy - especially cheese.  Cheese is the most concentrated form of trans-fats which is the worst thing to clog up our arteries. Veggie cheese is OK which can be found in the fresh produce section at [employer] or [employer] or [employer]  +++++++++++++++++++++ DASH Eating Plan  DASH stands for "Dietary Approaches to Stop Hypertension."   The DASH eating plan is a healthy eating plan that has been shown to reduce high blood pressure (hypertension). Additional health benefits may include reducing the risk of type 2 diabetes mellitus, heart disease, and stroke. The DASH eating plan may also help with weight loss. WHAT DO I NEED TO KNOW ABOUT THE DASH EATING PLAN? For the DASH eating plan, you will follow these general  guidelines:  Choose foods with a percent daily value for sodium of less than 5% (as listed on the food label).  Use salt-free seasonings or herbs instead of table salt or sea salt.  Check with your health care provider or pharmacist before using salt substitutes.  Eat lower-sodium products, often labeled as "lower sodium" or "no salt added."  Eat fresh foods.  Eat more vegetables, fruits, and low-fat dairy products.  Choose whole grains. Look for the word "whole" as the first word in the ingredient list.  Choose fish   Limit sweets, desserts, sugars, and sugary drinks.  Choose heart-healthy fats.  Eat veggie cheese     Eat more home-cooked food and less restaurant, buffet, and fast food.  Limit fried foods.  Cook foods using methods other than frying.  Limit canned vegetables. If you do use them, rinse them well to decrease the sodium.  When eating at a restaurant, ask that your food be prepared with less salt, or no salt if possible.                      WHAT FOODS CAN I EAT? Read Dr Joel Fuhrman's books on The End of Dieting & The End of Diabetes  Grains Whole grain or whole wheat bread. Brown rice. Whole grain or whole wheat pasta. Quinoa, bulgur, and whole grain cereals. Low-sodium cereals. Corn or whole wheat flour tortillas. Whole grain cornbread. Whole grain crackers. Low-sodium crackers.  Vegetables Fresh or frozen vegetables (raw, steamed, roasted, or grilled). Low-sodium or reduced-sodium tomato and vegetable juices. Low-sodium or reduced-sodium tomato sauce and paste. Low-sodium or reduced-sodium canned vegetables.   Fruits All fresh, canned (in natural juice), or frozen fruits.  Protein Products  All fish and seafood.  Dried beans, peas, or lentils. Unsalted nuts and seeds. Unsalted canned beans.  Dairy Low-fat dairy products, such as skim or 1% milk, 2% or reduced-fat cheeses, low-fat ricotta or cottage cheese, or plain low-fat yogurt. Low-sodium or  reduced-sodium cheeses.  Fats and Oils Tub margarines without trans fats. Light or reduced-fat mayonnaise and salad dressings (reduced sodium). Avocado. Safflower, olive, or canola oils. Natural peanut or almond butter.  Other Unsalted popcorn and pretzels. The items listed above may not be a complete list of recommended foods or beverages. Contact your dietitian for more options.  +++++++++++++++  WHAT FOODS ARE NOT RECOMMENDED? Grains/ White flour or wheat flour White bread. White pasta. White rice. Refined cornbread. Bagels and croissants. Crackers that contain trans fat.  Vegetables  Creamed or fried vegetables. Vegetables in a . Regular canned vegetables. Regular canned tomato sauce and paste. Regular tomato and vegetable juices.  Fruits Dried fruits. Canned fruit in light or heavy syrup. Fruit juice.  Meat and Other Protein Products Meat in general - RED meat & White meat.  Fatty cuts of meat. Ribs, chicken wings, all processed meats as bacon, sausage, bologna, salami, fatback, hot dogs, bratwurst and packaged luncheon meats.  Dairy Whole or 2% milk, cream, half-and-half, and cream cheese. Whole-fat or sweetened yogurt. Full-fat cheeses or blue cheese. Non-dairy creamers and whipped toppings. Processed cheese, cheese spreads, or cheese curds.  Condiments Onion and garlic salt, seasoned salt, table salt, and sea salt. Canned and packaged gravies. Worcestershire sauce. Tartar sauce. Barbecue sauce. Teriyaki sauce. Soy sauce, including reduced sodium. Steak sauce. Fish sauce. Oyster sauce. Cocktail sauce. Horseradish. Ketchup and mustard. Meat flavorings and tenderizers. Bouillon cubes. Hot sauce. Tabasco sauce. Marinades. Taco seasonings. Relishes.  Fats and Oils Butter, stick margarine, lard, shortening and bacon fat. Coconut, palm kernel, or palm oils. Regular salad dressings.  Pickles and olives. Salted popcorn and pretzels.  The items listed above may not be a complete  list of foods and beverages to avoid.   

## 2016-10-05 ENCOUNTER — Ambulatory Visit (INDEPENDENT_AMBULATORY_CARE_PROVIDER_SITE_OTHER): Payer: Medicare Other | Admitting: Internal Medicine

## 2016-10-05 ENCOUNTER — Ambulatory Visit (HOSPITAL_COMMUNITY)
Admission: RE | Admit: 2016-10-05 | Discharge: 2016-10-05 | Disposition: A | Discharge status: Home / Self Care | Payer: Medicare Other | Source: Ambulatory Visit | Attending: Internal Medicine | Admitting: Internal Medicine

## 2016-10-05 ENCOUNTER — Encounter: Payer: Self-pay | Admitting: Internal Medicine

## 2016-10-05 VITALS — BP 112/64 | HR 76 | Temp 97.5°F | Resp 18 | Ht 71.0 in | Wt 190.2 lb

## 2016-10-05 DIAGNOSIS — N184 Chronic kidney disease, stage 4 (severe): Secondary | ICD-10-CM | POA: Diagnosis not present

## 2016-10-05 DIAGNOSIS — E559 Vitamin D deficiency, unspecified: Secondary | ICD-10-CM

## 2016-10-05 DIAGNOSIS — J449 Chronic obstructive pulmonary disease, unspecified: Secondary | ICD-10-CM | POA: Diagnosis not present

## 2016-10-05 DIAGNOSIS — Z23 Encounter for immunization: Secondary | ICD-10-CM

## 2016-10-05 DIAGNOSIS — R0600 Dyspnea, unspecified: Secondary | ICD-10-CM | POA: Insufficient documentation

## 2016-10-05 DIAGNOSIS — I119 Hypertensive heart disease without heart failure: Secondary | ICD-10-CM | POA: Insufficient documentation

## 2016-10-05 DIAGNOSIS — R0602 Shortness of breath: Secondary | ICD-10-CM | POA: Diagnosis not present

## 2016-10-05 DIAGNOSIS — J984 Other disorders of lung: Secondary | ICD-10-CM | POA: Diagnosis not present

## 2016-10-05 DIAGNOSIS — I10 Essential (primary) hypertension: Secondary | ICD-10-CM

## 2016-10-05 DIAGNOSIS — E1122 Type 2 diabetes mellitus with diabetic chronic kidney disease: Secondary | ICD-10-CM

## 2016-10-05 DIAGNOSIS — I480 Paroxysmal atrial fibrillation: Secondary | ICD-10-CM

## 2016-10-05 DIAGNOSIS — N183 Chronic kidney disease, stage 3 (moderate): Secondary | ICD-10-CM

## 2016-10-05 DIAGNOSIS — Z79899 Other long term (current) drug therapy: Secondary | ICD-10-CM

## 2016-10-05 DIAGNOSIS — Z7901 Long term (current) use of anticoagulants: Secondary | ICD-10-CM

## 2016-10-05 DIAGNOSIS — E782 Mixed hyperlipidemia: Secondary | ICD-10-CM | POA: Diagnosis not present

## 2016-10-05 DIAGNOSIS — M109 Gout, unspecified: Secondary | ICD-10-CM

## 2016-10-05 MED ORDER — GLIPIZIDE 10 MG PO TABS: ORAL_TABLET | ORAL | 1 refills | Status: DC

## 2016-10-06 LAB — BASIC METABOLIC PANEL WITH GFR
BUN/Creatinine Ratio: 17 (calc) (ref 6–22)
BUN: 36 mg/dL — ABNORMAL HIGH (ref 7–25)
CALCIUM: 9 mg/dL (ref 8.6–10.3)
CHLORIDE: 101 mmol/L (ref 98–110)
CO2: 30 mmol/L (ref 20–32)
Creat: 2.11 mg/dL — ABNORMAL HIGH (ref 0.70–1.18)
GFR, EST AFRICAN AMERICAN: 34 mL/min/{1.73_m2} — AB (ref >=60)
GFR, Est Non African American: 29 mL/min/{1.73_m2} — ABNORMAL LOW (ref >=60)
Glucose, Bld: 396 mg/dL — ABNORMAL HIGH (ref 65–99)
POTASSIUM: 4.1 mmol/L (ref 3.5–5.3)
Sodium: 141 mmol/L (ref 135–146)

## 2016-10-06 LAB — CBC WITH DIFFERENTIAL/PLATELET
BASOS ABS: 30 {cells}/uL (ref 0–200)
Basophils Relative: 0.4 %
EOS PCT: 0.9 %
Eosinophils Absolute: 67 cells/uL (ref 15–500)
HCT: 32.7 % — ABNORMAL LOW (ref 38.5–50.0)
Hemoglobin: 10.9 g/dL — ABNORMAL LOW (ref 13.2–17.1)
LYMPHS ABS: 770 {cells}/uL — AB (ref 850–3900)
MCH: 32.2 pg (ref 27.0–33.0)
MCHC: 33.3 g/dL (ref 32.0–36.0)
MCV: 96.5 fL (ref 80.0–100.0)
MONOS PCT: 6.2 %
MPV: 9.1 fL (ref 7.5–12.5)
NEUTROS ABS: 6075 {cells}/uL (ref 1500–7800)
Neutrophils Relative %: 82.1 %
Platelets: 174 10*3/uL (ref 140–400)
RBC: 3.39 10*6/uL — AB (ref 4.20–5.80)
RDW: 13.2 % (ref 11.0–15.0)
Total Lymphocyte: 10.4 %
WBC mixed population: 459 cells/uL (ref 200–950)
WBC: 7.4 10*3/uL (ref 3.8–10.8)

## 2016-10-06 LAB — LIPID PANEL
Cholesterol: 136 mg/dL (ref <200)
HDL: 27 mg/dL — ABNORMAL LOW (ref >40)
LDL Cholesterol (Calc): 78 mg/dL (calc)
Non-HDL Cholesterol (Calc): 109 mg/dL (calc) (ref <130)
TRIGLYCERIDES: 226 mg/dL — AB (ref <150)
Total CHOL/HDL Ratio: 5 (calc) — ABNORMAL HIGH (ref <5.0)

## 2016-10-06 LAB — HEPATIC FUNCTION PANEL
AG Ratio: 1.6 (calc) (ref 1.0–2.5)
ALBUMIN MSPROF: 4.3 g/dL (ref 3.6–5.1)
ALKALINE PHOSPHATASE (APISO): 93 U/L (ref 40–115)
ALT: 23 U/L (ref 9–46)
AST: 16 U/L (ref 10–35)
BILIRUBIN DIRECT: 0.2 mg/dL (ref 0.0–0.2)
Globulin: 2.7 g/dL (calc) (ref 1.9–3.7)
Indirect Bilirubin: 0.6 mg/dL (calc) (ref 0.2–1.2)
Total Bilirubin: 0.8 mg/dL (ref 0.2–1.2)
Total Protein: 7 g/dL (ref 6.1–8.1)

## 2016-10-06 LAB — HEMOGLOBIN A1C
Hgb A1c MFr Bld: 10.9 % of total Hgb — ABNORMAL HIGH (ref <5.7)
Mean Plasma Glucose: 266 (calc)
eAG (mmol/L): 14.7 (calc)

## 2016-10-06 LAB — PROTIME-INR
INR: 1.4 — ABNORMAL HIGH
PROTHROMBIN TIME: 15 s — AB (ref 9.0–11.5)

## 2016-10-06 LAB — VITAMIN D 25 HYDROXY (VIT D DEFICIENCY, FRACTURES): Vit D, 25-Hydroxy: 61 ng/mL (ref 30–100)

## 2016-10-06 LAB — MAGNESIUM: MAGNESIUM: 2.4 mg/dL (ref 1.5–2.5)

## 2016-10-06 LAB — INSULIN, RANDOM: Insulin: 4.2 u[IU]/mL (ref 2.0–19.6)

## 2016-10-06 LAB — TSH: TSH: 1.55 m[IU]/L (ref 0.40–4.50)

## 2016-10-06 LAB — URIC ACID: Uric Acid, Serum: 6.3 mg/dL (ref 4.0–8.0)

## 2016-10-12 ENCOUNTER — Encounter: Payer: Self-pay | Admitting: Adult Health

## 2016-10-12 ENCOUNTER — Ambulatory Visit (INDEPENDENT_AMBULATORY_CARE_PROVIDER_SITE_OTHER): Payer: Medicare Other | Admitting: Adult Health

## 2016-10-12 ENCOUNTER — Other Ambulatory Visit (HOSPITAL_COMMUNITY): Payer: Self-pay

## 2016-10-12 VITALS — BP 124/78 | HR 81 | Temp 97.4°F | Resp 16 | Ht 71.0 in | Wt 180.4 lb

## 2016-10-12 DIAGNOSIS — M2540 Effusion, unspecified joint: Secondary | ICD-10-CM

## 2016-10-12 MED ORDER — WARFARIN SODIUM 5 MG PO TABS: 5.0000 mg | ORAL_TABLET | Freq: Two times a day (BID) | ORAL | 3 refills | Status: DC

## 2016-10-12 MED ORDER — SERTRALINE HCL 100 MG PO TABS: 100.0000 mg | ORAL_TABLET | Freq: Every day | ORAL | 1 refills | Status: DC

## 2016-10-12 MED ORDER — METOLAZONE 5 MG PO TABS: ORAL_TABLET | ORAL | 0 refills | Status: DC

## 2016-10-12 NOTE — Patient Instructions (Signed)
Knee Effusion Knee effusion means that you have excess fluid in your knee joint. This can cause pain and swelling in your knee. This may make your knee more difficult to bend and move. That is because there is increased pain and pressure in the joint. If there is fluid in your knee, it often means that something is wrong inside your knee, such as severe arthritis, abnormal inflammation, or an infection. Another common cause of knee effusion is an injury to the knee muscles, ligaments, or cartilage. Follow these instructions at home:  Use crutches as directed by your health care provider.  Wear a knee brace as directed by your health care provider.  Apply ice to the swollen area: ? Put ice in a plastic bag. ? Place a towel between your skin and the bag. ? Leave the ice on for 20 minutes, 2-3 times per day.  Keep your knee raised (elevated) when you are sitting or lying down.  Take medicines only as directed by your health care provider.  Do any rehabilitation or strengthening exercises as directed by your health care provider.  Rest your knee as directed by your health care provider. You may start doing your normal activities again when your health care provider approves.  Keep all follow-up visits as directed by your health care provider. This is important. Contact a health care provider if:  You have ongoing (persistent) pain in your knee. Get help right away if:  You have increased swelling or redness of your knee.  You have severe pain in your knee.  You have a fever. This information is not intended to replace advice given to you by your health care provider. Make sure you discuss any questions you have with your health care provider. Document Released: 03/14/2003 Document Revised: 05/30/2015 Document Reviewed: 08/07/2013 Elsevier Interactive Patient Education  2018 [employer]  

## 2016-10-12 NOTE — Telephone Encounter (Signed)
Pt wanted a med Rx sent to another pharmacy. Also gave him his follow up appt on 12/6.
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
## 2016-10-13 DIAGNOSIS — M25561 Pain in right knee: Secondary | ICD-10-CM | POA: Diagnosis not present

## 2016-10-15 DIAGNOSIS — M25561 Pain in right knee: Secondary | ICD-10-CM | POA: Diagnosis not present

## 2016-10-15 DIAGNOSIS — M7041 Prepatellar bursitis, right knee: Secondary | ICD-10-CM | POA: Diagnosis not present

## 2016-10-20 ENCOUNTER — Other Ambulatory Visit (HOSPITAL_COMMUNITY): Payer: Self-pay | Admitting: Internal Medicine

## 2016-10-22 ENCOUNTER — Encounter (INDEPENDENT_AMBULATORY_CARE_PROVIDER_SITE_OTHER): Payer: Medicare Other | Admitting: Ophthalmology

## 2016-10-22 DIAGNOSIS — H353132 Nonexudative age-related macular degeneration, bilateral, intermediate dry stage: Secondary | ICD-10-CM | POA: Diagnosis not present

## 2016-10-22 DIAGNOSIS — H43813 Vitreous degeneration, bilateral: Secondary | ICD-10-CM | POA: Diagnosis not present

## 2016-10-22 DIAGNOSIS — E11311 Type 2 diabetes mellitus with unspecified diabetic retinopathy with macular edema: Secondary | ICD-10-CM

## 2016-10-22 DIAGNOSIS — H35033 Hypertensive retinopathy, bilateral: Secondary | ICD-10-CM

## 2016-10-22 DIAGNOSIS — I10 Essential (primary) hypertension: Secondary | ICD-10-CM | POA: Diagnosis not present

## 2016-10-22 DIAGNOSIS — H34832 Tributary (branch) retinal vein occlusion, left eye, with macular edema: Secondary | ICD-10-CM

## 2016-10-22 DIAGNOSIS — E113291 Type 2 diabetes mellitus with mild nonproliferative diabetic retinopathy without macular edema, right eye: Secondary | ICD-10-CM

## 2016-10-26 DIAGNOSIS — M25561 Pain in right knee: Secondary | ICD-10-CM | POA: Diagnosis not present

## 2016-10-26 DIAGNOSIS — M7041 Prepatellar bursitis, right knee: Secondary | ICD-10-CM | POA: Diagnosis not present

## 2016-10-27 DIAGNOSIS — E790 Hyperuricemia without signs of inflammatory arthritis and tophaceous disease: Secondary | ICD-10-CM | POA: Diagnosis not present

## 2016-10-27 DIAGNOSIS — M79643 Pain in unspecified hand: Secondary | ICD-10-CM | POA: Diagnosis not present

## 2016-10-27 DIAGNOSIS — M1990 Unspecified osteoarthritis, unspecified site: Secondary | ICD-10-CM | POA: Diagnosis not present

## 2016-10-27 DIAGNOSIS — M109 Gout, unspecified: Secondary | ICD-10-CM | POA: Diagnosis not present

## 2016-10-27 DIAGNOSIS — N189 Chronic kidney disease, unspecified: Secondary | ICD-10-CM | POA: Diagnosis not present

## 2016-11-03 DIAGNOSIS — M7041 Prepatellar bursitis, right knee: Secondary | ICD-10-CM | POA: Diagnosis not present

## 2016-11-03 DIAGNOSIS — M25561 Pain in right knee: Secondary | ICD-10-CM | POA: Diagnosis not present

## 2016-11-09 DIAGNOSIS — M25561 Pain in right knee: Secondary | ICD-10-CM | POA: Diagnosis not present

## 2016-11-09 DIAGNOSIS — M7041 Prepatellar bursitis, right knee: Secondary | ICD-10-CM | POA: Diagnosis not present

## 2016-11-11 ENCOUNTER — Encounter: Payer: Self-pay | Admitting: Physician Assistant

## 2016-11-11 ENCOUNTER — Ambulatory Visit (INDEPENDENT_AMBULATORY_CARE_PROVIDER_SITE_OTHER): Payer: Medicare Other | Admitting: Physician Assistant

## 2016-11-11 VITALS — BP 122/68 | HR 87 | Temp 97.3°F | Resp 14 | Ht 71.0 in | Wt 185.0 lb

## 2016-11-11 DIAGNOSIS — F3342 Major depressive disorder, recurrent, in full remission: Secondary | ICD-10-CM

## 2016-11-11 DIAGNOSIS — I480 Paroxysmal atrial fibrillation: Secondary | ICD-10-CM | POA: Diagnosis not present

## 2016-11-11 DIAGNOSIS — I43 Cardiomyopathy in diseases classified elsewhere: Secondary | ICD-10-CM

## 2016-11-11 DIAGNOSIS — Z7901 Long term (current) use of anticoagulants: Secondary | ICD-10-CM | POA: Diagnosis not present

## 2016-11-11 DIAGNOSIS — I110 Hypertensive heart disease with heart failure: Secondary | ICD-10-CM | POA: Diagnosis not present

## 2016-11-11 DIAGNOSIS — D692 Other nonthrombocytopenic purpura: Secondary | ICD-10-CM | POA: Diagnosis not present

## 2016-11-11 DIAGNOSIS — N183 Chronic kidney disease, stage 3 unspecified: Secondary | ICD-10-CM

## 2016-11-11 DIAGNOSIS — E1122 Type 2 diabetes mellitus with diabetic chronic kidney disease: Secondary | ICD-10-CM

## 2016-11-11 DIAGNOSIS — I4892 Unspecified atrial flutter: Secondary | ICD-10-CM | POA: Diagnosis not present

## 2016-11-11 DIAGNOSIS — J9611 Chronic respiratory failure with hypoxia: Secondary | ICD-10-CM

## 2016-11-11 DIAGNOSIS — I5032 Chronic diastolic (congestive) heart failure: Secondary | ICD-10-CM | POA: Diagnosis not present

## 2016-11-11 NOTE — Progress Notes (Signed)
[name] follow up  Patient is on [name] for PAF (paroxysmal atrial fibrillation) ([place]) [I48.0] Patient's last INR is  Lab Results  Component Value Date   INR 1.4 (H) 10/05/2016   INR 3.1 (H) 09/04/2016   INR 1.8 (H) 08/04/2016    Patient denies SOB, CP, dizziness, nose bleeds, easy bleeding, and blood in stool/urine. His [name] dose was changed last visit, 39m 4 day and 7.560m3 days.  He has not taken ABX, no falls, and as not missed any doses.  Denies dizziness, nausea, headaches, confusion. Does have senile purpura.  Going to PT for his knee, it is doing better.  He also has complicated heart history with history of CABG and MAZE in 2010, failed DCCV with refractory flutter, following with Dr. BeHaroldine Lawsnd has done well with diueresis.  Wt Readings from Last 5 Encounters:  11/11/16 185 lb (83.9 kg)  10/12/16 180 lb 6.4 oz (81.8 kg)  10/05/16 190 lb 3.2 oz (86.3 kg)  09/04/16 187 lb 12.8 oz (85.2 kg)  08/04/16 184 lb 6.4 oz (83.6 kg)     Current Outpatient Medications on File Prior to Visit  Medication Sig Dispense Refill  . acetaminophen (TYLENOL) 500 MG tablet Take 500-1,000 mg by mouth every 6 (six) hours as needed (pain).    . Marland Kitchenllopurinol (ZYLOPRIM) 300 MG tablet Take 1 tablet daily to prevent gout 90 tablet 1  . ALPRAZolam (XANAX) 1 MG tablet Take 0.5 tablets (0.5 mg total) by mouth at bedtime as needed for sleep. 60 tablet 0  . Alum Hydroxide-Mag Carbonate (GAVISCON PO) Take 1 tablet by mouth daily as needed (acid reflux).    . Marland Kitchentorvastatin (LIPITOR) 40 MG tablet Take 1 tablet (40 mg total) by mouth daily. 90 tablet 3  . azelastine (ASTELIN) 0.1 % nasal spray Place 2 sprays into both nostrils 2 (two) times daily. Use in each nostril as directed 30 mL 2  . B Complex Vitamins (VITAMIN B COMPLEX PO) Take 1 tablet by mouth at bedtime.     . bacitracin 500 UNIT/GM ointment Apply 1 application topically daily as needed for wound care.    . Blood Glucose Monitoring Suppl  (ACCU-CHEK AVIVA PLUS) w/Device KIT Check blood sugar 1 time  daily 1 kit 0  . carvedilol (COREG) 6.25 MG tablet TAKE 1 TABLET BY MOUTH TWO  TIMES DAILY 180 tablet 3  . Cholecalciferol (VITAMIN D-3) 5000 units TABS Take 5,000 Units by mouth at bedtime.    . colchicine 0.6 MG tablet     . diphenhydrAMINE (BENADRYL) 25 MG tablet Take 25 mg by mouth at bedtime as needed for allergies.     . fluticasone (FLONASE) 50 MCG/ACT nasal spray Place 1 spray into both nostrils daily as needed for allergies.     . Marland KitchenlipiZIDE (GLUCOTROL) 10 MG tablet Take 1 tablet 3 x/ day before meals 270 tablet 1  . glucose blood (ACCU-CHEK AVIVA PLUS) test strip CHECK BLOOD GLUCOSE 3 TIMES DAILY. 300 each 1  . HYDROcodone-acetaminophen (NORCO/VICODIN) 5-325 MG tablet Take 1-2 tablets by mouth every 6 (six) hours as needed for moderate pain.     . Marland Kitchenidocaine (LIDODERM) 5 % Place 1 patch onto the skin daily as needed (pain). Remove & Discard patch within 12 hours or as directed by MD    . loratadine (CLARITIN) 10 MG tablet Take 10 mg by mouth daily.    . metFORMIN (GLUCOPHAGE-XR) 500 MG 24 hr tablet TAKE 2 TABLETS BY MOUTH TWO TIMES DAILY 360 tablet 1  .  metolazone (ZAROXOLYN) 5 MG tablet Take 5 mg by mouth daily as needed (weight gain and edema). 20 tablet 0  . Multiple Vitamin (MULTIVITAMIN WITH MINERALS) TABS tablet Take 1 tablet by mouth at bedtime.     . Multiple Vitamins-Minerals (PRESERVISION AREDS 2) CAPS Take 1 capsule by mouth 2 (two) times daily.     . polyvinyl alcohol (ARTIFICIAL TEARS) 1.4 % ophthalmic solution Place 1 drop into both eyes daily as needed for dry eyes.    . potassium chloride SA (K-DUR,KLOR-CON) 20 MEQ tablet Take 1 tablet (20 mEq total) by mouth 2 (two) times daily. 180 tablet 4  . PRESCRIPTION MEDICATION Inhale into the lungs at bedtime. CPAP    . ranitidine (ZANTAC) 150 MG tablet Take 150 mg by mouth daily as needed for heartburn.     . sertraline (ZOLOFT) 100 MG tablet Take 1 tablet (100 mg  total) by mouth daily. 90 tablet 1  . torsemide (DEMADEX) 100 MG tablet Take 100 mg in AM and 50 mg in PM if needed for weight gain 180 tablet 3  . valsartan (DIOVAN) 320 MG tablet TAKE 0.5 TABLETS (160 MG TOTAL) BY MOUTH DAILY. 45 tablet 3  . warfarin ([name]) 5 MG tablet Take 1 tablet (5 mg total) by mouth 2 (two) times daily. 180 tablet 3   No current facility-administered medications on file prior to visit.    Past Medical History:  Diagnosis Date  . Adrenal adenoma   . Anxiety   . Atypical atrial flutter ([place]) 8/15, 10/15   a. DCCV 08/2013. b. s/p RFA 10/2013.  [name] Basal cell carcinoma   . CAD (coronary artery disease)    a. 04/2013 CABG x 2: LIMA to LAD, SVG to RI, EVH via R thigh.  . Cellulitis 12/2015   left leg  . Chronic diastolic congestive heart failure ([place])   . CKD (chronic kidney disease), stage III ([place])   . Depression   . Diabetes mellitus type II   . Diverticulosis 2001  . DJD (degenerative joint disease)   . GERD (gastroesophageal reflux disease)   . H/O hiatal hernia   . History of cardioversion    x3 (years uncertain)  . Hx of adenomatous colonic polyps   . Hyperlipidemia   . Hypertension   . Hypertensive cardiomyopathy ([place])   . Obstructive sleep apnea    compliant with CPAP  . 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 Persistent atrial fibrillation ([place])    a. s/p MAZE 04/2013 in setting of CABG. b. Amio stopped in 10/2013 after flutter ablation.  [name] PFO (patent foramen ovale)    a. Small PFO by TEE 10/2013.  [name] Pleural effusion, left    a. s/p thoracentesis 05/2013.  [name] Respiratory failure ([place])    a. Hypoxia 10/2013 - required supp O2 as inpatient, did not require it at discharge.  . S/P Maze operation for atrial fibrillation    a. 04/2013: Complete bilateral atrial lesion set using cryothermy and bipolar radiofrequency ablation with clipping of LA  appendage (@ time of CABG)  . S/P Maze operation for atrial fibrillation 04/05/2013   Complete bilateral atrial lesion set using cryothermy and bipolar radiofrequency ablation with clipping of LA appendage via median sternotomy approach    Allergies  Allergen Reactions  . Sunflower Seed [Sunflower Oil] Swelling and Other (See Comments)    Tongue and lip swelling  . Horse-Derived Products  Other (See Comments)    Per allergy skin test  . Tetanus Toxoids Other (See Comments)    Per allergy skin test    Review of Systems  Constitutional: Positive for malaise/fatigue. Negative for chills and fever.  HENT: Negative.   Respiratory: Positive for shortness of breath (better). Negative for cough and wheezing.   Cardiovascular: Negative for chest pain, orthopnea, leg swelling (improved) and PND.  Gastrointestinal: Negative.   Genitourinary: Negative for dysuria, flank pain, frequency, hematuria and urgency.  Musculoskeletal: Negative.   Skin: Negative.   Neurological: Negative for dizziness, tingling, tremors, sensory change, speech change, focal weakness, seizures, loss of consciousness and weakness.  Psychiatric/Behavioral: Negative.  Negative for depression and suicidal ideas.     Physical: Blood pressure 122/68, pulse 87, temperature (!) 97.3 F (36.3 C), resp. rate 14, height 5' 11"  (1.803 m), weight 185 lb (83.9 kg), SpO2 95 %. Filed Weights   11/11/16 1048  Weight: 185 lb (83.9 kg)    General Appearance: Well nourished, in no apparent distress. ENT/Mouth: Nares clear with no erythema, swelling, mucus on turbinates. No ulcers, cracking, on lips. No erythema, swelling, or exudate on post pharynx.  Neck: Supple, thyroid normal.  Respiratory: lungs CTAB  Cardio:  Irregular, irregular rhythm, with holosystolic murmur without rubs or gallops. No edema  Abdomen: distended, with bowl sounds. Non tender, no guarding, rebound, hernias, masses, or organomegaly.  Skin: Warm, dry without  rashes, lesions, ecchymosis.  Neuro: Unremarkable  Assessment and plan: Chronic anticoagulation- check INR and will adjust medication according to labs.  Discussed if patient falls to immediately contact office or go to ER. Discussed foods that can increase or decrease [name] levels. Patient understands to call the office before starting a new medication. Follow up in one month.   PAF (paroxysmal atrial fibrillation) (HCC) Rate controlled -     Protime-INR  Chronic diastolic congestive heart failure (HCC) Weight stable, continue follow up  Hypertensive cardiomyopathy, with heart failure ([facility]) Weight stable, continue follow up Weight daily  Senile purpura ([place]) -     Protime-INR - monitor, wear long sleeves  Atrial flutter, unspecified type ([place]) Continue meds and cardio FU  Chronic respiratory failure with hypoxia ([place]) Improved, monitor weight  Controlled type 2 diabetes mellitus with stage 3 chronic kidney disease, without long-term current use of insulin ([place]) Discussed general issues about diabetes pathophysiology and management., Educational material distributed., Suggested low cholesterol diet., Encouraged aerobic exercise., Discussed foot care., Reminded to get yearly retinal exam.  -     CBC with Differential/Platelet -     BASIC METABOLIC PANEL WITH GFR  Recurrent major depressive disorder, in full remission ([place]) Continue meds, in remission  Chronic anticoagulation -     Protime-INR      Future Appointments  Date Time Provider [place]  12/09/2016  1:40 PM Bensimhon, Shaune Pascal, MD [facility] None  12/11/2016 11:15 AM Liane Comber, NP [facility] None  02/04/2017  8:15 AM Hayden Pedro, MD [facility] None  04/21/2017  2:00 PM [name], MD [facility] None

## 2016-11-12 DIAGNOSIS — M7041 Prepatellar bursitis, right knee: Secondary | ICD-10-CM | POA: Diagnosis not present

## 2016-11-12 DIAGNOSIS — M25561 Pain in right knee: Secondary | ICD-10-CM | POA: Diagnosis not present

## 2016-11-12 LAB — CBC WITH DIFFERENTIAL/PLATELET
BASOS ABS: 32 {cells}/uL (ref 0–200)
BASOS PCT: 0.4 %
EOS PCT: 1.1 %
Eosinophils Absolute: 87 cells/uL (ref 15–500)
HEMATOCRIT: 34.2 % — AB (ref 38.5–50.0)
HEMOGLOBIN: 11.1 g/dL — AB (ref 13.2–17.1)
LYMPHS ABS: 727 {cells}/uL — AB (ref 850–3900)
MCH: 30.7 pg (ref 27.0–33.0)
MCHC: 32.5 g/dL (ref 32.0–36.0)
MCV: 94.5 fL (ref 80.0–100.0)
MONOS PCT: 6.3 %
MPV: 9.3 fL (ref 7.5–12.5)
NEUTROS ABS: 6557 {cells}/uL (ref 1500–7800)
Neutrophils Relative %: 83 %
Platelets: 209 10*3/uL (ref 140–400)
RBC: 3.62 10*6/uL — ABNORMAL LOW (ref 4.20–5.80)
RDW: 13 % (ref 11.0–15.0)
Total Lymphocyte: 9.2 %
WBC mixed population: 498 cells/uL (ref 200–950)
WBC: 7.9 10*3/uL (ref 3.8–10.8)

## 2016-11-12 LAB — BASIC METABOLIC PANEL WITH GFR
BUN/Creatinine Ratio: 16 (calc) (ref 6–22)
BUN: 30 mg/dL — AB (ref 7–25)
CALCIUM: 9.1 mg/dL (ref 8.6–10.3)
CO2: 29 mmol/L (ref 20–32)
CREATININE: 1.83 mg/dL — AB (ref 0.70–1.18)
Chloride: 100 mmol/L (ref 98–110)
GFR, EST NON AFRICAN AMERICAN: 35 mL/min/{1.73_m2} — AB (ref >=60)
GFR, Est African American: 40 mL/min/{1.73_m2} — ABNORMAL LOW (ref >=60)
GLUCOSE: 287 mg/dL — AB (ref 65–99)
Potassium: 3.8 mmol/L (ref 3.5–5.3)
SODIUM: 139 mmol/L (ref 135–146)

## 2016-11-12 LAB — PROTIME-INR
INR: 1.9 — AB
Prothrombin Time: 19.7 s — ABNORMAL HIGH (ref 9.0–11.5)

## 2016-12-09 ENCOUNTER — Other Ambulatory Visit: Payer: Self-pay

## 2016-12-09 ENCOUNTER — Ambulatory Visit (HOSPITAL_COMMUNITY)
Admission: RE | Admit: 2016-12-09 | Discharge: 2016-12-09 | Disposition: A | Discharge status: Home / Self Care | Payer: Medicare Other | Source: Ambulatory Visit | Attending: Internal Medicine | Admitting: Internal Medicine

## 2016-12-09 ENCOUNTER — Encounter (HOSPITAL_COMMUNITY): Payer: Self-pay | Admitting: Internal Medicine

## 2016-12-09 VITALS — BP 140/84 | HR 68 | Wt 190.8 lb

## 2016-12-09 DIAGNOSIS — E785 Hyperlipidemia, unspecified: Secondary | ICD-10-CM | POA: Insufficient documentation

## 2016-12-09 DIAGNOSIS — I493 Ventricular premature depolarization: Secondary | ICD-10-CM | POA: Diagnosis not present

## 2016-12-09 DIAGNOSIS — N183 Chronic kidney disease, stage 3 (moderate): Secondary | ICD-10-CM | POA: Insufficient documentation

## 2016-12-09 DIAGNOSIS — I484 Atypical atrial flutter: Secondary | ICD-10-CM | POA: Diagnosis not present

## 2016-12-09 DIAGNOSIS — Z79899 Other long term (current) drug therapy: Secondary | ICD-10-CM | POA: Insufficient documentation

## 2016-12-09 DIAGNOSIS — I272 Pulmonary hypertension, unspecified: Secondary | ICD-10-CM | POA: Diagnosis not present

## 2016-12-09 DIAGNOSIS — I440 Atrioventricular block, first degree: Secondary | ICD-10-CM | POA: Insufficient documentation

## 2016-12-09 DIAGNOSIS — Z9889 Other specified postprocedural states: Secondary | ICD-10-CM | POA: Insufficient documentation

## 2016-12-09 DIAGNOSIS — Z7984 Long term (current) use of oral hypoglycemic drugs: Secondary | ICD-10-CM | POA: Insufficient documentation

## 2016-12-09 DIAGNOSIS — I5032 Chronic diastolic (congestive) heart failure: Secondary | ICD-10-CM | POA: Diagnosis not present

## 2016-12-09 DIAGNOSIS — I4510 Unspecified right bundle-branch block: Secondary | ICD-10-CM | POA: Diagnosis not present

## 2016-12-09 DIAGNOSIS — I130 Hypertensive heart and chronic kidney disease with heart failure and stage 1 through stage 4 chronic kidney disease, or unspecified chronic kidney disease: Secondary | ICD-10-CM | POA: Diagnosis not present

## 2016-12-09 DIAGNOSIS — Z87891 Personal history of nicotine dependence: Secondary | ICD-10-CM | POA: Insufficient documentation

## 2016-12-09 DIAGNOSIS — Z7901 Long term (current) use of anticoagulants: Secondary | ICD-10-CM | POA: Insufficient documentation

## 2016-12-09 DIAGNOSIS — Z85828 Personal history of other malignant neoplasm of skin: Secondary | ICD-10-CM | POA: Insufficient documentation

## 2016-12-09 DIAGNOSIS — K219 Gastro-esophageal reflux disease without esophagitis: Secondary | ICD-10-CM | POA: Diagnosis not present

## 2016-12-09 DIAGNOSIS — I480 Paroxysmal atrial fibrillation: Secondary | ICD-10-CM | POA: Diagnosis not present

## 2016-12-09 DIAGNOSIS — G4733 Obstructive sleep apnea (adult) (pediatric): Secondary | ICD-10-CM | POA: Diagnosis not present

## 2016-12-09 DIAGNOSIS — F329 Major depressive disorder, single episode, unspecified: Secondary | ICD-10-CM | POA: Insufficient documentation

## 2016-12-09 DIAGNOSIS — Z951 Presence of aortocoronary bypass graft: Secondary | ICD-10-CM | POA: Insufficient documentation

## 2016-12-09 DIAGNOSIS — I481 Persistent atrial fibrillation: Secondary | ICD-10-CM | POA: Insufficient documentation

## 2016-12-09 DIAGNOSIS — E1122 Type 2 diabetes mellitus with diabetic chronic kidney disease: Secondary | ICD-10-CM | POA: Insufficient documentation

## 2016-12-09 DIAGNOSIS — F419 Anxiety disorder, unspecified: Secondary | ICD-10-CM | POA: Insufficient documentation

## 2016-12-09 DIAGNOSIS — I2510 Atherosclerotic heart disease of native coronary artery without angina pectoris: Secondary | ICD-10-CM | POA: Insufficient documentation

## 2016-12-09 DIAGNOSIS — Z8371 Family history of colonic polyps: Secondary | ICD-10-CM | POA: Diagnosis not present

## 2016-12-09 DIAGNOSIS — J449 Chronic obstructive pulmonary disease, unspecified: Secondary | ICD-10-CM | POA: Insufficient documentation

## 2016-12-09 LAB — BASIC METABOLIC PANEL
ANION GAP: 11 (ref 5–15)
BUN: 37 mg/dL — ABNORMAL HIGH (ref 6–20)
CALCIUM: 8.9 mg/dL (ref 8.9–10.3)
CHLORIDE: 100 mmol/L — AB (ref 101–111)
CO2: 25 mmol/L (ref 22–32)
CREATININE: 1.94 mg/dL — AB (ref 0.61–1.24)
GFR calc non Af Amer: 32 mL/min — ABNORMAL LOW (ref >60)
GFR, EST AFRICAN AMERICAN: 37 mL/min — AB (ref >60)
Glucose, Bld: 351 mg/dL — ABNORMAL HIGH (ref 65–99)
Potassium: 4.1 mmol/L (ref 3.5–5.1)
SODIUM: 136 mmol/L (ref 135–145)

## 2016-12-09 LAB — CBC
HCT: 30.7 % — ABNORMAL LOW (ref 39.0–52.0)
Hemoglobin: 9.8 g/dL — ABNORMAL LOW (ref 13.0–17.0)
MCH: 30.4 pg (ref 26.0–34.0)
MCHC: 31.9 g/dL (ref 30.0–36.0)
MCV: 95.3 fL (ref 78.0–100.0)
Platelets: 219 10*3/uL (ref 150–400)
RBC: 3.22 MIL/uL — ABNORMAL LOW (ref 4.22–5.81)
RDW: 14.3 % (ref 11.5–15.5)
WBC: 8.2 10*3/uL (ref 4.0–10.5)

## 2016-12-09 NOTE — Progress Notes (Signed)
Patient ID: [name], male   DOB: 1940-08-04, 76 y.o.   MRN: [id number]   Advanced Heart Failure Clinic Note   Patient ID: [name], male   DOB: 1940-01-28, 76 y.o.   MRN: [id number]  Primary Cardiologist: [name]   Subjective: [name] is a 76 y/o male with COPD , DM, PAF, 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, CKD, AFL s/p ablation in 10/15. 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 Had surgery in 4/15. Did well for awhile went to cardiac rehab and was feeling fine.  Developed AFL in 10/15 and underwent RFA. Felt good.   In 3/16 began to develop severe SOB. Started O2. Says his symptoms got worse almost overnight. 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 He has seen Dr. Roxy Manns but felt to have no optimal surgical options for repair. His case was also presented to Dr. Michaelle Birks at [facility] who agreed that there was no way to baffle or reroute the anomalous PV flow to the LA. He had a TEE which showed LVEF 60-65% with a dilated right side and a small PFO. He has also been seen by Dr. Lake Bells who performed PFTs that showed significant restrictive lung disease with a low DLCO. He had f/u with Dr. Gilles Chiquito in the [facility] who felt his symptoms were multifactorial. Unclear why symptom onset so quick   Admitted 12/17 for gout and LE cellulitis   Today he returns for HF follow up. Overall feels ok but says he is getting tired easily. Over the last 6-8 weeks he has noticed increased dyspnea with steps and when he is carrying items. Having back discomfort between shoulder blades. Complaining of leg fatigue. He reports black stools for 3 days last week. Weight has been stable at home.  Appetite ok. No fever or chills. Taking all medications. Rarely takes metolazone. Rarely takes extra 50 mg of torsemide.   Echo 12/17 LVEF 55-60% RV moderately dilated mild HK. RVSP 68mHG    PFTs (7/16) FEV1 1.45 L (45%) FVC 1.77 L (40%) DLCO 46%  RHC 4/16 RA = 18 RV = 72/4/17 PA = 76/27 (46) PCW = 21 Fick cardiac output/index (using PA sat) = 9.2/4.45 Thermo CO/CI = 10.0/4.87 PVR = 2.2 WU Fick cardiac output/index (using high SVC sat) = 5.2/2.5 Pulse-ox saturation = 89%  High SVC sat = 54% Low SVC sat = 81% (at SVC/RA junction) RA sat = 68% RV sat = 66% PA sat = 68%, 69% IVC sat =56%   ECGO 1/17: EF 60%. RV dilated with moderately reduced systolic function. RVSP 70. + bubble.  VQ/CT negative for PE   TEE 10/15 small PFO  Ab u/s 6/16 liver normal + ascites. Medico renal kidney disease.  Past Medical History:  Diagnosis Date  . Adrenal adenoma   . Anxiety   . Atypical atrial flutter ([place] 8/[place], 10/15   a. DCCV 08/2013. b. s/p RFA 10/2013.  .Marland KitchenBasal cell carcinoma   . CAD (coronary artery disease)    a. 04/2013 CABG x 2: LIMA to LAD, SVG to RI, EVH via R thigh.  . Cellulitis 12/2015   left leg  . Chronic diastolic congestive heart failure ([place]   . CKD (chronic kidney disease), stage III ([place]   . Depression   . Diabetes mellitus type II   . Diverticulosis 2001  .  DJD (degenerative joint disease)   . GERD (gastroesophageal reflux disease)   . H/O hiatal hernia   . History of cardioversion    x3 (years uncertain)  . Hx of adenomatous colonic polyps   . Hyperlipidemia   . Hypertension   . Hypertensive cardiomyopathy ([place])   . Obstructive sleep apnea    compliant with CPAP  . 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 Persistent atrial fibrillation ([place])    a. s/p MAZE 04/2013 in setting of CABG. b. Amio stopped in 10/2013 after flutter ablation.  [name] PFO (patent foramen ovale)    a. Small PFO by TEE 10/2013.  [name] Pleural effusion, left    a. s/p thoracentesis 05/2013.  [name] Respiratory failure ([place])    a. Hypoxia 10/2013 - required supp O2 as  inpatient, did not require it at discharge.  . S/P Maze operation for atrial fibrillation    a. 04/2013: Complete bilateral atrial lesion set using cryothermy and bipolar radiofrequency ablation with clipping of LA appendage (@ time of CABG)  . S/P Maze operation for atrial fibrillation 04/05/2013   Complete bilateral atrial lesion set using cryothermy and bipolar radiofrequency ablation with clipping of LA appendage via median sternotomy approach     Current Outpatient Medications  Medication Sig Dispense Refill  . acetaminophen (TYLENOL) 500 MG tablet Take 500-1,000 mg by mouth every 6 (six) hours as needed (pain).    [name] allopurinol (ZYLOPRIM) 300 MG tablet Take 1 tablet daily to prevent gout 90 tablet 1  . ALPRAZolam (XANAX) 1 MG tablet Take 0.5 tablets (0.5 mg total) by mouth at bedtime as needed for sleep. 60 tablet 0  . Alum Hydroxide-Mag Carbonate (GAVISCON PO) Take 1 tablet by mouth daily as needed (acid reflux).    [name] atorvastatin (LIPITOR) 40 MG tablet Take 1 tablet (40 mg total) by mouth daily. 90 tablet 3  . azelastine (ASTELIN) 0.1 % nasal spray Place 2 sprays into both nostrils 2 (two) times daily. Use in each nostril as directed 30 mL 2  . B Complex Vitamins (VITAMIN B COMPLEX PO) Take 1 tablet by mouth at bedtime.     . bacitracin 500 UNIT/GM ointment Apply 1 application topically daily as needed for wound care.    . Blood Glucose Monitoring Suppl (ACCU-CHEK AVIVA PLUS) w/Device KIT Check blood sugar 1 time  daily 1 kit 0  . carvedilol (COREG) 6.25 MG tablet TAKE 1 TABLET BY MOUTH TWO  TIMES DAILY 180 tablet 3  . Cholecalciferol (VITAMIN D-3) 5000 units TABS Take 5,000 Units by mouth at bedtime.    . colchicine 0.6 MG tablet as needed.     . diphenhydrAMINE (BENADRYL) 25 MG tablet Take 25 mg by mouth at bedtime as needed for allergies.     . fluticasone (FLONASE) 50 MCG/ACT nasal spray Place 1 spray into both nostrils daily as needed for allergies.     [name] glipiZIDE (GLUCOTROL) 10 MG  tablet Take 1 tablet 3 x/ day before meals 270 tablet 1  . glucose blood (ACCU-CHEK AVIVA PLUS) test strip CHECK BLOOD GLUCOSE 3 TIMES DAILY. 300 each 1  . HYDROcodone-acetaminophen (NORCO/VICODIN) 5-325 MG tablet Take 1-2 tablets by mouth every 6 (six) hours as needed for moderate pain.     [name] lidocaine (LIDODERM) 5 % Place 1 patch onto the skin daily as needed (pain). Remove & Discard patch within 12 hours or as directed by  MD    . loratadine (CLARITIN) 10 MG tablet Take 10 mg by mouth daily.    . metFORMIN (GLUCOPHAGE-XR) 500 MG 24 hr tablet TAKE 2 TABLETS BY MOUTH TWO TIMES DAILY 360 tablet 1  . metolazone (ZAROXOLYN) 5 MG tablet Take 5 mg by mouth daily as needed (weight gain and edema). 20 tablet 0  . Multiple Vitamin (MULTIVITAMIN WITH MINERALS) TABS tablet Take 1 tablet by mouth at bedtime.     . Multiple Vitamins-Minerals (PRESERVISION AREDS 2) CAPS Take 1 capsule by mouth 2 (two) times daily.     . polyvinyl alcohol (ARTIFICIAL TEARS) 1.4 % ophthalmic solution Place 1 drop into both eyes daily as needed for dry eyes.    . potassium chloride SA (K-DUR,KLOR-CON) 20 MEQ tablet Take 1 tablet (20 mEq total) by mouth 2 (two) times daily. 180 tablet 4  . PRESCRIPTION MEDICATION Inhale into the lungs at bedtime. CPAP    . ranitidine (ZANTAC) 150 MG tablet Take 150 mg by mouth daily as needed for heartburn.     . sertraline (ZOLOFT) 100 MG tablet Take 1 tablet (100 mg total) by mouth daily. 90 tablet 1  . torsemide (DEMADEX) 100 MG tablet Take 100 mg in AM and 50 mg in PM if needed for weight gain (Patient taking differently: 50 mg as needed. Take 100 mg in AM and 50 mg in PM if needed for weight gain) 180 tablet 3  . valsartan (DIOVAN) 320 MG tablet TAKE 0.5 TABLETS (160 MG TOTAL) BY MOUTH DAILY. 45 tablet 3  . warfarin (COUMADIN) 5 MG tablet Take 1 tablet (5 mg total) by mouth 2 (two) times daily. 180 tablet 3   No current facility-administered medications for this encounter.     Allergies    Allergen Reactions  . Sunflower Seed [Sunflower Oil] Swelling and Other (See Comments)    Tongue and lip swelling  . Horse-Derived Products Other (See Comments)    Per allergy skin test  . Tetanus Toxoids Other (See Comments)    Per allergy skin test      Social History   Socioeconomic History  . Marital status: Married    Spouse name: Not on file  . Number of children: 1  . Years of education: Not on file  . Highest education level: Not on file  Social Needs  . Financial resource strain: Not on file  . Food insecurity - worry: Not on file  . Food insecurity - inability: Not on file  . Transportation needs - medical: Not on file  . Transportation needs - non-medical: Not on file  Occupational History  . Occupation: retired Software engineer  Tobacco Use  . Smoking status: Former Smoker    Packs/day: 4.00    Years: 25.00    Pack years: 100.00    Types: Cigarettes    Last attempt to quit: 01/05/1981    Years since quitting: 35.9  . Smokeless tobacco: Never Used  Substance and Sexual Activity  . Alcohol use: Yes    Alcohol/week: 0.6 oz    Types: 1 Shots of liquor per week    Comment: 1-5 drinks per week  . Drug use: No  . Sexual activity: Not on file  Other Topics Concern  . Not on file  Social History Narrative   Daily caffeine-yes   Patient gets regular exercise.   Pt lives in [place] with spouse.  Retired Software engineer.  Family History  Problem Relation Age of Onset  . Dementia Father   . Colon cancer Mother        Family History/Uncle   . Colon polyps Mother        Family History  . Atrial fibrillation Mother   . Hypertension Mother   . Colon polyps Sister        Family history  . Diabetes Maternal Uncle   . Stroke Paternal Uncle     Vitals:   12/09/16 1355  BP: 140/84  Pulse: 68  SpO2: 97%  Weight: 190 lb 12.8 oz (86.5 kg)   Wt Readings from Last 3 Encounters:  12/09/16 190 lb 12.8 oz (86.5 kg)  11/11/16 185 lb (83.9 kg)  10/12/16 180 lb 6.4 oz (81.8 kg)     PHYSICAL EXAM: General:  Well appearing. No resp difficulty HEENT: normal Neck: supple. JVP ear . Carotids 2+ bilat; no bruits. No lymphadenopathy or thryomegaly appreciated. Cor: PMI nondisplaced. Regular rate & rhythm. No rubs, gallops or murmurs. Lungs: clear Abdomen: soft, nontender, + distended. No hepatosplenomegaly. No bruits or masses. Good bowel sounds. Extremities: no cyanosis, clubbing, rash, edema Neuro: alert & orientedx3, cranial nerves grossly intact. moves all 4 extremities w/o difficulty. Affect pleasant  EKG : SR 1st degree heart block with PVCs. RBBB  ASSESSMENT  1. Chronic diastolic HF with R>>L symptoms Volume status elevated.  Continue torsemide 100 mg daily with metolazone for the next 2 days  2. RV failure 3. Pulmonary HTN 4. Left to right shunting through large anomalous  pulmonary vein into SVC 5. DM2 6. CAD s/p CABG On coumadin.  7. AF s/p Maze   --In NSR today.  --On coumadin.  8. AFL s/p ablation 10/15 9. CKD, stage 3, baseline creatinine 1.7-1.8 Creatinine 1.8 on 11/11/2016  10. HTN 11. RBBB- new. May need repeat cath.    Amy Clegg NP-C  2:08 PM  Patient seen and examined with Darrick Grinder, NP. We discussed all aspects of the encounter. I agree with the assessment and plan as stated above.   He has been doing very well but feels worse over the last week or two. Does have mild volume overload on exam but weight unchanged. Will try metolazone for 2 days and see him back next week to assess response and need for further intensification of work-up.   Labs back today with progressive anemia and I think this is major factor. Will need to check iron studies and stool cards.   Total time spent 35 minutes. Over half that time spent discussing above.   Glori Bickers, MD  10:23 PM

## 2016-12-09 NOTE — Patient Instructions (Signed)
Take Metolazone for 2 days  Labs today  Your physician recommends that you schedule a follow-up appointment in: 1 week with echocardiogram

## 2016-12-10 NOTE — Progress Notes (Signed)
FOLLOW UP  Assessment and Plan:   Diagnoses and all orders for this visit:  PAF (paroxysmal atrial fibrillation) ([place]) Check INR and will adjust medication according to labs.  Discussed if patient falls to immediately contact office or go to ER. Discussed foods that can increase or decrease Coumadin levels. Patient understands to call the office before starting a new medication. Follow up in one month.   Senile purpura (HCC) Fragile secondary to age; monitor closely for excessive bleeding r/t anticoagulation  CHF Recent exacerbation - was seen by cardiology 2 days ago with medication change - 9lb fluid lost - scheduled for ECHO next week for recent increased dyspnea with exertion Emphasized salt restriction, less than 2032m a day. Encouraged daily monitoring of the patient's weight, call office if 5 lb weight loss or gain in a day.  Encouraged regular exercise. If any increasing shortness of breath, swelling, or chest pressure go to ER immediately.  decrease your fluid intake to less than 2 L daily please remember to always increase your potassium intake with any increase of your fluid pill.   CKD (chronic kidney disease) stage 3, GFR 30-59 ml/min (HCC) Increase fluids, avoid NSAIDS, monitor sugars, will monitor  Hypertension Well controlled with current medications  Monitor blood pressure at home; patient to call if consistently greater than 130/80 Continue DASH diet.   Reminder to go to the ER if any CP, SOB, nausea, dizziness, severe HA, changes vision/speech, left arm numbness and tingling and jaw pain.  Cholesterol Continue medication  Continue low cholesterol diet and exercise.  Check lipid panel.   Controlled type 2 diabetes mellitus with stage 3 chronic kidney disease, without long-term current use of insulin (HCC) Continue medications - if A1C remains above 9 will bring back to start insulin Continue diet and exercise.  Perform daily foot/skin check, notify office  of any concerning changes.  Check A1C  Vitamin D Def/ osteoporosis prevention Continue supplementation Check Vit D level  Recurrent major depressive disorder, in full remission ([place] Well managed with current regimen; Continue medications  Lifestyle discussed: diet/exerise, sleep hygiene, stress management, hydration  Continue diet and meds as discussed. Further disposition pending results of labs. Discussed med's effects and SE's.   Over 30 minutes of exam, counseling, chart review, and critical decision making was performed.   Future Appointments  Date Time Provider [place] 12/16/2016  9:00 AM [facility] 12/16/2016 10:00 AM Bensimhon, DShaune Pascal MD MC-HVSC None  02/04/2017  8:15 AM MHayden Pedro MD TRE-TRE None  04/21/2017  2:00 PM MUnk Pinto MD GAAM-GAAIM None    ----------------------------------------------------------------------------------------------------------------------  HPI 76y.o. male  presents for 3 month follow up on chronic medical conditions - htn, CHF, T2DM, hyperlipidemia, CKD stage 3, and recurrent major depression currently in remission on treatment, and 4 week follow up for coumadin monitoring.  The patient is s/p CABG & MAZE procedure in 2015; has also had RF Ablation for Afib, followed by Dr BMissy Sabinsin the HF clinic. 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  He does admit exertional dyspnea with moderate activity. T2DM has been poorly controlled on review of A1Cs over this past year; at the last visit it was discussed that  if sugars didn't improve by doubling Glipizide to 10 mg 3 x / day by this visit - will need to switch to Novolin 70/30 mix taking 2 x / day, with possible D/C of metformin secondary to declining kidney function.  Home blood glucose - 120- 180 fasting   he has a diagnosis of recurrent major depression and is currently on zoloft 100 mg, reports symptoms are well controlled on  current regimen.   Patient is on Coumadin for PAF (paroxysmal atrial fibrillation) ([place]) [I48.0] Patient's last INR is  Lab Results  Component Value Date   INR 1.9 (H) 11/11/2016   INR 1.4 (H) 10/05/2016   INR 3.1 (H) 09/04/2016    Patient denies SOB, CP, dizziness, nose bleeds, easy bleeding, and blood in stool/urine. His coumadin dose was not changed last visit. He has not taken ABX, has not missed any doses and denies a fall.   2 tabs (10 mg) 4 x /week on TThSS & 1.5 tab (7.5 mg) 3 x / week  ( 12.5 tab = 62.5 mg / week)   BMI is Body mass index is 25.24 kg/m., he has been working on diet and exercise. Wt Readings from Last 3 Encounters:  12/11/16 181 lb (82.1 kg)  12/09/16 190 lb 12.8 oz (86.5 kg)  11/11/16 185 lb (83.9 kg)   His blood pressure has been controlled at home, today their BP is BP: 110/64  He does not workout. He denies chest pain, dizziness. He endorses some SOB with exertion from CHF exacerbation over the last 2 months- he followed up with cardiology 2 days ago and was stated on metolazone x 2 days - has lost 9 lb  - he is scheduled for ECHO next week.    He is on cholesterol medication and denies myalgias. His cholesterol is not at goal. The cholesterol last visit was:   Lab Results  Component Value Date   CHOL 136 10/05/2016   HDL 27 (L) 10/05/2016   LDLCALC NOT CALC 07/01/2016   TRIG 226 (H) 10/05/2016   CHOLHDL 5.0 (H) 10/05/2016    He has been working on diet and exercise for type 2 diabetes, and denies increased appetite, nausea, paresthesia of the feet, polydipsia, polyuria, visual disturbances and vomiting. Last A1C in the office was:  Lab Results  Component Value Date   HGBA1C 10.9 (H) 10/05/2016   Patient is on Vitamin D supplement and was below goal at the last visit:    Lab Results  Component Value Date   VD25OH 61 10/05/2016        Current Medications:  Current Outpatient Medications on File Prior to Visit  Medication Sig  .  acetaminophen (TYLENOL) 500 MG tablet Take 500-1,000 mg by mouth every 6 (six) hours as needed (pain).  [name] allopurinol (ZYLOPRIM) 300 MG tablet Take 1 tablet daily to prevent gout  . ALPRAZolam (XANAX) 1 MG tablet Take 0.5 tablets (0.5 mg total) by mouth at bedtime as needed for sleep.  [name] Alum Hydroxide-Mag Carbonate (GAVISCON PO) Take 1 tablet by mouth daily as needed (acid reflux).  [name] atorvastatin (LIPITOR) 40 MG tablet Take 1 tablet (40 mg total) by mouth daily.  [name] azelastine (ASTELIN) 0.1 % nasal spray Place 2 sprays into both nostrils 2 (two) times daily. Use in each nostril as directed  . B Complex Vitamins (VITAMIN B COMPLEX PO) Take 1 tablet by mouth at bedtime.   . bacitracin 500 UNIT/GM ointment Apply 1 application topically daily as needed for wound care.  . Blood Glucose Monitoring Suppl (ACCU-CHEK AVIVA PLUS) w/Device KIT Check blood sugar 1 time  daily  . carvedilol (COREG) 6.25 MG tablet TAKE 1 TABLET BY MOUTH TWO  TIMES DAILY  . Cholecalciferol (VITAMIN D-3) 5000  units TABS Take 5,000 Units by mouth at bedtime.  . colchicine 0.6 MG tablet as needed.   . diphenhydrAMINE (BENADRYL) 25 MG tablet Take 25 mg by mouth at bedtime as needed for allergies.   . fluticasone (FLONASE) 50 MCG/ACT nasal spray Place 1 spray into both nostrils daily as needed for allergies.   [name] glipiZIDE (GLUCOTROL) 10 MG tablet Take 1 tablet 3 x/ day before meals  . glucose blood (ACCU-CHEK AVIVA PLUS) test strip CHECK BLOOD GLUCOSE 3 TIMES DAILY.  [name] HYDROcodone-acetaminophen (NORCO/VICODIN) 5-325 MG tablet Take 1-2 tablets by mouth every 6 (six) hours as needed for moderate pain.   [name] lidocaine (LIDODERM) 5 % Place 1 patch onto the skin daily as needed (pain). Remove & Discard patch within 12 hours or as directed by MD  . loratadine (CLARITIN) 10 MG tablet Take 10 mg by mouth daily.  . metFORMIN (GLUCOPHAGE-XR) 500 MG 24 hr tablet TAKE 2 TABLETS BY MOUTH TWO TIMES DAILY  . metolazone (ZAROXOLYN) 5 MG tablet Take  5 mg by mouth daily as needed (weight gain and edema).  . Multiple Vitamin (MULTIVITAMIN WITH MINERALS) TABS tablet Take 1 tablet by mouth at bedtime.   . Multiple Vitamins-Minerals (PRESERVISION AREDS 2) CAPS Take 1 capsule by mouth 2 (two) times daily.   . polyvinyl alcohol (ARTIFICIAL TEARS) 1.4 % ophthalmic solution Place 1 drop into both eyes daily as needed for dry eyes.  . potassium chloride SA (K-DUR,KLOR-CON) 20 MEQ tablet Take 1 tablet (20 mEq total) by mouth 2 (two) times daily.  [name] PRESCRIPTION MEDICATION Inhale into the lungs at bedtime. CPAP  . ranitidine (ZANTAC) 150 MG tablet Take 150 mg by mouth daily as needed for heartburn.   . sertraline (ZOLOFT) 100 MG tablet Take 1 tablet (100 mg total) by mouth daily.  [name] torsemide (DEMADEX) 100 MG tablet Take 100 mg in AM and 50 mg in PM if needed for weight gain (Patient taking differently: 50 mg as needed. Take 100 mg in AM and 50 mg in PM if needed for weight gain)  . valsartan (DIOVAN) 320 MG tablet TAKE 0.5 TABLETS (160 MG TOTAL) BY MOUTH DAILY.  [name] warfarin (COUMADIN) 5 MG tablet Take 1 tablet (5 mg total) by mouth 2 (two) times daily.   No current facility-administered medications on file prior to visit.      Allergies:  Allergies  Allergen Reactions  . Sunflower Seed [Sunflower Oil] Swelling and Other (See Comments)    Tongue and lip swelling  . Horse-Derived Products Other (See Comments)    Per allergy skin test  . Tetanus Toxoids Other (See Comments)    Per allergy skin test     Medical History:  Past Medical History:  Diagnosis Date  . Adrenal adenoma   . Anxiety   . Atypical atrial flutter ([place]) 8/15, 10/15   a. DCCV 08/2013. b. s/p RFA 10/2013.  [name] Basal cell carcinoma   . CAD (coronary artery disease)    a. 04/2013 CABG x 2: LIMA to LAD, SVG to RI, EVH via R thigh.  . Cellulitis 12/2015   left leg  . Chronic diastolic congestive heart failure ([place])   . CKD (chronic kidney disease), stage III ([place])   .  Depression   . Diabetes mellitus type II   . Diverticulosis 2001  . DJD (degenerative joint disease)   . GERD (gastroesophageal reflux disease)   . H/O hiatal hernia   . History of cardioversion    x3 (years uncertain)  .  Hx of adenomatous colonic polyps   . Hyperlipidemia   . Hypertension   . Hypertensive cardiomyopathy ([place])   . Obstructive sleep apnea    compliant with CPAP  . 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 Persistent atrial fibrillation ([place])    a. s/p MAZE 04/2013 in setting of CABG. b. Amio stopped in 10/2013 after flutter ablation.  [name] PFO (patent foramen ovale)    a. Small PFO by TEE 10/2013.  [name] Pleural effusion, left    a. s/p thoracentesis 05/2013.  [name] Respiratory failure ([place])    a. Hypoxia 10/2013 - required supp O2 as inpatient, did not require it at discharge.  . S/P Maze operation for atrial fibrillation    a. 04/2013: Complete bilateral atrial lesion set using cryothermy and bipolar radiofrequency ablation with clipping of LA appendage (@ time of CABG)  . S/P Maze operation for atrial fibrillation 04/05/2013   Complete bilateral atrial lesion set using cryothermy and bipolar radiofrequency ablation with clipping of LA appendage via median sternotomy approach    Family history- Reviewed and unchanged Social history- Reviewed and unchanged   Review of Systems:  Review of Systems  Constitutional: Negative for malaise/fatigue and weight loss.  HENT: Negative for hearing loss and tinnitus.   Eyes: Negative for blurred vision and double vision.  Respiratory: Positive for shortness of breath (With exertion x 2 months - followed up with cardiology 2 days ago with medication change- ECHO scheduled next week). Negative for cough and wheezing.   Cardiovascular: Negative for chest pain, palpitations, orthopnea, claudication and leg swelling.  Gastrointestinal:  Negative for abdominal pain, blood in stool, constipation, diarrhea, heartburn, melena, nausea and vomiting.  Genitourinary: Negative.   Musculoskeletal: Negative for joint pain and myalgias.  Skin: Negative for rash.  Neurological: Negative for dizziness, tingling, sensory change, weakness and headaches.  Endo/Heme/Allergies: Negative for polydipsia.  Psychiatric/Behavioral: Negative.   All other systems reviewed and are negative.     Physical Exam: BP 110/64   Pulse 89   Temp 98.1 F (36.7 C)   Ht _0  (1.803 m)   Wt 181 lb (82.1 kg)   SpO2 97%   BMI 25.24 kg/m  Wt Readings from Last 3 Encounters:  12/11/16 181 lb (82.1 kg)  12/09/16 190 lb 12.8 oz (86.5 kg)  11/11/16 185 lb (83.9 kg)   General Appearance: Well nourished, in no apparent distress. Eyes: PERRLA, EOMs, conjunctiva no swelling or erythema Sinuses: No Frontal/maxillary tenderness ENT/Mouth: Ext aud canals clear, TMs without erythema, bulging. No erythema, swelling, or exudate on post pharynx.  Tonsils not swollen or erythematous. Hearing normal.  Neck: Supple, thyroid normal.  Respiratory: Respiratory effort normal, BS equal bilaterally without rales, rhonchi, wheezing or stridor.  Cardio: Rate irregular, PVCs heard with no murmurs. 1+ peripheral pulses without edema.  Abdomen: Soft, + BS.  Non tender, no guarding, rebound, hernias, masses. Lymphatics: Non tender without lymphadenopathy.  Musculoskeletal: Full ROM, 5/5 strength, Normal gait Skin: Warm, dry without rashes; scattered flat dark brown discoloration to neck and forearms - some scattered small bruises.  Neuro: Cranial nerves intact. No cerebellar symptoms.  Psych: Awake and oriented X 3, normal affect, Insight and Judgment appropriate.    Izora Ribas, NP 11:32 AM [facility]

## 2016-12-11 ENCOUNTER — Encounter: Payer: Self-pay | Admitting: Adult Health

## 2016-12-11 ENCOUNTER — Ambulatory Visit (INDEPENDENT_AMBULATORY_CARE_PROVIDER_SITE_OTHER): Payer: Medicare Other | Admitting: Adult Health

## 2016-12-11 VITALS — BP 110/64 | HR 89 | Temp 98.1°F | Ht 71.0 in | Wt 181.0 lb

## 2016-12-11 DIAGNOSIS — I10 Essential (primary) hypertension: Secondary | ICD-10-CM

## 2016-12-11 DIAGNOSIS — D692 Other nonthrombocytopenic purpura: Secondary | ICD-10-CM

## 2016-12-11 DIAGNOSIS — F3342 Major depressive disorder, recurrent, in full remission: Secondary | ICD-10-CM

## 2016-12-11 DIAGNOSIS — N183 Chronic kidney disease, stage 3 unspecified: Secondary | ICD-10-CM

## 2016-12-11 DIAGNOSIS — I480 Paroxysmal atrial fibrillation: Secondary | ICD-10-CM

## 2016-12-11 DIAGNOSIS — E782 Mixed hyperlipidemia: Secondary | ICD-10-CM | POA: Diagnosis not present

## 2016-12-11 DIAGNOSIS — E1122 Type 2 diabetes mellitus with diabetic chronic kidney disease: Secondary | ICD-10-CM | POA: Diagnosis not present

## 2016-12-11 DIAGNOSIS — I5032 Chronic diastolic (congestive) heart failure: Secondary | ICD-10-CM

## 2016-12-11 DIAGNOSIS — Z79899 Other long term (current) drug therapy: Secondary | ICD-10-CM | POA: Diagnosis not present

## 2016-12-11 DIAGNOSIS — E559 Vitamin D deficiency, unspecified: Secondary | ICD-10-CM | POA: Diagnosis not present

## 2016-12-11 NOTE — Patient Instructions (Signed)

## 2016-12-12 LAB — LIPID PANEL
CHOL/HDL RATIO: 4.8 (calc) (ref <5.0)
Cholesterol: 130 mg/dL (ref <200)
HDL: 27 mg/dL — AB (ref >40)
LDL CHOLESTEROL (CALC): 75 mg/dL
NON-HDL CHOLESTEROL (CALC): 103 mg/dL (ref <130)
TRIGLYCERIDES: 184 mg/dL — AB (ref <150)

## 2016-12-12 LAB — HEMOGLOBIN A1C
EAG (MMOL/L): 13 (calc)
HEMOGLOBIN A1C: 9.8 %{Hb} — AB (ref <5.7)
MEAN PLASMA GLUCOSE: 235 (calc)

## 2016-12-12 LAB — BASIC METABOLIC PANEL WITH GFR
BUN / CREAT RATIO: 20 (calc) (ref 6–22)
BUN: 44 mg/dL — AB (ref 7–25)
CO2: 31 mmol/L (ref 20–32)
CREATININE: 2.17 mg/dL — AB (ref 0.70–1.18)
Calcium: 9.6 mg/dL (ref 8.6–10.3)
Chloride: 96 mmol/L — ABNORMAL LOW (ref 98–110)
GFR, EST AFRICAN AMERICAN: 33 mL/min/{1.73_m2} — AB (ref >=60)
GFR, EST NON AFRICAN AMERICAN: 28 mL/min/{1.73_m2} — AB (ref >=60)
Glucose, Bld: 275 mg/dL — ABNORMAL HIGH (ref 65–99)
Potassium: 3.5 mmol/L (ref 3.5–5.3)
SODIUM: 139 mmol/L (ref 135–146)

## 2016-12-12 LAB — CBC WITH DIFFERENTIAL/PLATELET
BASOS PCT: 0.5 %
Basophils Absolute: 44 cells/uL (ref 0–200)
EOS PCT: 1 %
Eosinophils Absolute: 88 cells/uL (ref 15–500)
HEMATOCRIT: 32.7 % — AB (ref 38.5–50.0)
HEMOGLOBIN: 10.5 g/dL — AB (ref 13.2–17.1)
LYMPHS ABS: 651 {cells}/uL — AB (ref 850–3900)
MCH: 29.9 pg (ref 27.0–33.0)
MCHC: 32.1 g/dL (ref 32.0–36.0)
MCV: 93.2 fL (ref 80.0–100.0)
MPV: 9.2 fL (ref 7.5–12.5)
Monocytes Relative: 7.1 %
NEUTROS ABS: 7392 {cells}/uL (ref 1500–7800)
NEUTROS PCT: 84 %
Platelets: 288 10*3/uL (ref 140–400)
RBC: 3.51 10*6/uL — AB (ref 4.20–5.80)
RDW: 13.6 % (ref 11.0–15.0)
Total Lymphocyte: 7.4 %
WBC mixed population: 625 cells/uL (ref 200–950)
WBC: 8.8 10*3/uL (ref 3.8–10.8)

## 2016-12-12 LAB — PROTIME-INR
INR: 1.6 — ABNORMAL HIGH
PROTHROMBIN TIME: 16.5 s — AB (ref 9.0–11.5)

## 2016-12-12 LAB — HEPATIC FUNCTION PANEL
AG RATIO: 1.5 (calc) (ref 1.0–2.5)
ALT: 16 U/L (ref 9–46)
AST: 14 U/L (ref 10–35)
Albumin: 4.2 g/dL (ref 3.6–5.1)
Alkaline phosphatase (APISO): 99 U/L (ref 40–115)
BILIRUBIN DIRECT: 0.2 mg/dL (ref 0.0–0.2)
BILIRUBIN INDIRECT: 0.6 mg/dL (ref 0.2–1.2)
BILIRUBIN TOTAL: 0.8 mg/dL (ref 0.2–1.2)
GLOBULIN: 2.8 g/dL (ref 1.9–3.7)
Total Protein: 7 g/dL (ref 6.1–8.1)

## 2016-12-12 LAB — TSH: TSH: 1.61 mIU/L (ref 0.40–4.50)

## 2016-12-12 LAB — VITAMIN D 25 HYDROXY (VIT D DEFICIENCY, FRACTURES): Vit D, 25-Hydroxy: 78 ng/mL (ref 30–100)

## 2016-12-14 ENCOUNTER — Other Ambulatory Visit: Payer: Self-pay | Admitting: Internal Medicine

## 2016-12-14 ENCOUNTER — Other Ambulatory Visit: Payer: Self-pay | Admitting: Physician Assistant

## 2016-12-14 DIAGNOSIS — N183 Chronic kidney disease, stage 3 unspecified: Secondary | ICD-10-CM

## 2016-12-14 DIAGNOSIS — E1122 Type 2 diabetes mellitus with diabetic chronic kidney disease: Secondary | ICD-10-CM

## 2016-12-14 DIAGNOSIS — Z0001 Encounter for general adult medical examination with abnormal findings: Secondary | ICD-10-CM

## 2016-12-15 ENCOUNTER — Encounter: Payer: Self-pay | Admitting: *Deleted

## 2016-12-16 ENCOUNTER — Encounter (HOSPITAL_COMMUNITY): Payer: Self-pay | Admitting: Internal Medicine

## 2016-12-16 ENCOUNTER — Other Ambulatory Visit: Payer: Self-pay

## 2016-12-16 ENCOUNTER — Ambulatory Visit (HOSPITAL_BASED_OUTPATIENT_CLINIC_OR_DEPARTMENT_OTHER)
Admission: RE | Admit: 2016-12-16 | Discharge: 2016-12-16 | Disposition: A | Discharge status: Home / Self Care | Payer: Medicare Other | Source: Ambulatory Visit | Attending: Internal Medicine | Admitting: Internal Medicine

## 2016-12-16 ENCOUNTER — Ambulatory Visit (HOSPITAL_COMMUNITY)
Admission: RE | Admit: 2016-12-16 | Discharge: 2016-12-16 | Disposition: A | Discharge status: Home / Self Care | Payer: Medicare Other | Source: Ambulatory Visit | Attending: Internal Medicine | Admitting: Internal Medicine

## 2016-12-16 VITALS — BP 113/76 | HR 76 | Wt 184.0 lb

## 2016-12-16 DIAGNOSIS — I4891 Unspecified atrial fibrillation: Secondary | ICD-10-CM | POA: Diagnosis not present

## 2016-12-16 DIAGNOSIS — E119 Type 2 diabetes mellitus without complications: Secondary | ICD-10-CM | POA: Diagnosis not present

## 2016-12-16 DIAGNOSIS — Q262 Total anomalous pulmonary venous connection: Secondary | ICD-10-CM | POA: Diagnosis not present

## 2016-12-16 DIAGNOSIS — N183 Chronic kidney disease, stage 3 unspecified: Secondary | ICD-10-CM

## 2016-12-16 DIAGNOSIS — Z951 Presence of aortocoronary bypass graft: Secondary | ICD-10-CM | POA: Diagnosis not present

## 2016-12-16 DIAGNOSIS — D649 Anemia, unspecified: Secondary | ICD-10-CM

## 2016-12-16 DIAGNOSIS — I2510 Atherosclerotic heart disease of native coronary artery without angina pectoris: Secondary | ICD-10-CM | POA: Diagnosis not present

## 2016-12-16 DIAGNOSIS — J449 Chronic obstructive pulmonary disease, unspecified: Secondary | ICD-10-CM | POA: Insufficient documentation

## 2016-12-16 DIAGNOSIS — I5032 Chronic diastolic (congestive) heart failure: Secondary | ICD-10-CM

## 2016-12-16 DIAGNOSIS — I272 Pulmonary hypertension, unspecified: Secondary | ICD-10-CM | POA: Diagnosis not present

## 2016-12-16 LAB — BASIC METABOLIC PANEL
ANION GAP: 14 (ref 5–15)
BUN: 64 mg/dL — ABNORMAL HIGH (ref 6–20)
CHLORIDE: 93 mmol/L — AB (ref 101–111)
CO2: 29 mmol/L (ref 22–32)
Calcium: 9.3 mg/dL (ref 8.9–10.3)
Creatinine, Ser: 2.59 mg/dL — ABNORMAL HIGH (ref 0.61–1.24)
GFR calc Af Amer: 26 mL/min — ABNORMAL LOW (ref >60)
GFR, EST NON AFRICAN AMERICAN: 22 mL/min — AB (ref >60)
GLUCOSE: 336 mg/dL — AB (ref 65–99)
POTASSIUM: 3.4 mmol/L — AB (ref 3.5–5.1)
Sodium: 136 mmol/L (ref 135–145)

## 2016-12-16 LAB — CBC
HEMATOCRIT: 33 % — AB (ref 39.0–52.0)
HEMOGLOBIN: 10.4 g/dL — AB (ref 13.0–17.0)
MCH: 29.2 pg (ref 26.0–34.0)
MCHC: 31.5 g/dL (ref 30.0–36.0)
MCV: 92.7 fL (ref 78.0–100.0)
Platelets: 240 10*3/uL (ref 150–400)
RBC: 3.56 MIL/uL — ABNORMAL LOW (ref 4.22–5.81)
RDW: 13.9 % (ref 11.5–15.5)
WBC: 7.8 10*3/uL (ref 4.0–10.5)

## 2016-12-16 LAB — IRON: Iron: 58 ug/dL (ref 45–182)

## 2016-12-16 NOTE — Patient Instructions (Signed)
Labs today  Please use Hemoccult card and return to [place]  Your physician recommends that you schedule a follow-up appointment in: 3 months

## 2016-12-16 NOTE — Progress Notes (Signed)
Patient ID: [name], male   DOB: [birth year], 76 y.o.   MRN: [id number]   Advanced Heart Failure Clinic Note   Patient ID: [name], male   DOB: 1940-01-08, 76 y.o.   MRN: [id number]  Primary Cardiologist: [name]   Subjective: [name] is a 76 y/o male with COPD , DM, PAF, 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, CKD, AFL s/p ablation in 10/15. 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 Had surgery in 4/15. Did well for a while went to cardiac rehab and was feeling fine.  Developed AFL in 10/15 and underwent RFA.   In 3/16 began to develop severe SOB. Started O2. Says his symptoms got worse almost overnight. 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 He has seen Dr. Roxy Manns but felt to have no optimal surgical options for repair. His case was also presented to Dr. Michaelle Birks at [facility] who agreed that there was no way to baffle or reroute the anomalous PV flow to the LA. He had a TEE which showed LVEF 60-65% with a dilated right side and a small PFO. He has also been seen by Dr. Lake Bells who performed PFTs that showed significant restrictive lung disease with a low DLCO. He had f/u with Dr. Gilles Chiquito in the [facility] who felt his symptoms were multifactorial.  Admitted 12/17 for gout and LE cellulitis   Today he returns for HF follow up. We saw him last week and was feeling poorly. More SOB and bloated. Gave metolazone 2.5 for two days. Labs also showed hgb down 11.1 -> 9.8 but back to 10.5 on Friday with PCP which his baseline. Over Thanksgiving had dark stools for 3 days. Now resolved. Now feels better. Back to baseline. Weight down 6-7 pounds. No orthopnea or PND. Had a day this week when he was very orthostatic but now better.   Echo today LVEF 60% RV dilated mildly HK  Echo 12/17 LVEF 55-60% RV moderately dilated mild HK. RVSP 21mHG   PFTs (7/16) FEV1 1.45 L  (45%) FVC 1.77 L (40%) DLCO 46%  RHC 4/16 RA = 18 RV = 72/4/17 PA = 76/27 (46) PCW = 21 Fick cardiac output/index (using PA sat) = 9.2/4.45 Thermo CO/CI = 10.0/4.87 PVR = 2.2 WU Fick cardiac output/index (using high SVC sat) = 5.2/2.5 Pulse-ox saturation = 89%  High SVC sat = 54% Low SVC sat = 81% (at SVC/RA junction) RA sat = 68% RV sat = 66% PA sat = 68%, 69% IVC sat =56%   ECGO 1/17: EF 60%. RV dilated with moderately reduced systolic function. RVSP 70. + bubble.  VQ/CT negative for PE   TEE 10/15 small PFO  Ab u/s 6/16 liver normal + ascites. Medico renal kidney disease.  Past Medical History:  Diagnosis Date  . Adrenal adenoma   . Anxiety   . [place]   a. DCCV 08/2013. b. s/p RFA 10/2013.  .Marland KitchenBasal cell carcinoma   . CAD (coronary artery disease)    a. 04/2013 CABG x 2: LIMA to LAD, SVG to RI, EVH via R thigh.  . Cellulitis 12/2015   left leg  . Chronic diastolic congestive heart [place]   . CKD (chronic kidney disease), stage III ([place]   . Depression   . Diabetes mellitus type II   . Diverticulosis  2001  . DJD (degenerative joint disease)   . GERD (gastroesophageal reflux disease)   . H/O hiatal hernia   . History of cardioversion    x3 (years uncertain)  . Hx of adenomatous colonic polyps   . Hyperlipidemia   . Hypertension   . Hypertensive cardiomyopathy ([place])   . Obstructive sleep apnea    compliant with CPAP  . 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 Persistent atrial fibrillation ([place])    a. s/p MAZE 04/2013 in setting of CABG. b. Amio stopped in 10/2013 after flutter ablation.  [name] PFO (patent foramen ovale)    a. Small PFO by TEE 10/2013.  [name] Pleural effusion, left    a. s/p thoracentesis 05/2013.  [name] Respiratory failure ([place])    a. Hypoxia 10/2013 - required supp O2 as inpatient, did not require  it at discharge.  . S/P Maze operation for atrial fibrillation    a. 04/2013: Complete bilateral atrial lesion set using cryothermy and bipolar radiofrequency ablation with clipping of LA appendage (@ time of CABG)  . S/P Maze operation for atrial fibrillation 04/05/2013   Complete bilateral atrial lesion set using cryothermy and bipolar radiofrequency ablation with clipping of LA appendage via median sternotomy approach     Current Outpatient Medications  Medication Sig Dispense Refill  . ACCU-CHEK AVIVA PLUS test strip CHECK BLOOD GLUCOSE 3 TIMES DAILY. 300 each 1  . acetaminophen (TYLENOL) 500 MG tablet Take 500-1,000 mg by mouth every 6 (six) hours as needed (pain).    [name] allopurinol (ZYLOPRIM) 300 MG tablet Take 1 tablet daily to prevent gout 90 tablet 1  . ALPRAZolam (XANAX) 1 MG tablet Take 0.5 tablets (0.5 mg total) by mouth at bedtime as needed for sleep. 60 tablet 0  . Alum Hydroxide-Mag Carbonate (GAVISCON PO) Take 1 tablet by mouth daily as needed (acid reflux).    [name] atorvastatin (LIPITOR) 40 MG tablet Take 1 tablet 3 times weekly    . azelastine (ASTELIN) 0.1 % nasal spray Place 2 sprays into both nostrils 2 (two) times daily. Use in each nostril as directed 30 mL 2  . B Complex Vitamins (VITAMIN B COMPLEX PO) Take 1 tablet by mouth at bedtime.     . bacitracin 500 UNIT/GM ointment Apply 1 application topically daily as needed for wound care.    . Blood Glucose Monitoring Suppl (ACCU-CHEK AVIVA PLUS) w/Device KIT Check blood sugar 1 time  daily 1 kit 0  . carvedilol (COREG) 6.25 MG tablet TAKE 1 TABLET BY MOUTH TWO  TIMES DAILY 180 tablet 3  . Cholecalciferol (VITAMIN D-3) 5000 units TABS Take 5,000 Units by mouth at bedtime.    . colchicine 0.6 MG tablet as needed.     . diphenhydrAMINE (BENADRYL) 25 MG tablet Take 25 mg by mouth at bedtime as needed for allergies.     . fluticasone (FLONASE) 50 MCG/ACT nasal spray Place 1 spray into both nostrils daily as needed for allergies.      [name] glipiZIDE (GLUCOTROL) 10 MG tablet Take 1 tablet 3 x/ day before meals 270 tablet 1  . HYDROcodone-acetaminophen (NORCO/VICODIN) 5-325 MG tablet Take 1-2 tablets by mouth every 6 (six) hours as needed for moderate pain.     [name] lidocaine (LIDODERM) 5 % Place 1 patch onto the skin daily as needed (pain). Remove & Discard patch within 12 hours or as directed by MD    .  loratadine (CLARITIN) 10 MG tablet Take 10 mg by mouth daily.    . metFORMIN (GLUCOPHAGE-XR) 500 MG 24 hr tablet TAKE 2 TABLETS BY MOUTH TWO TIMES DAILY 360 tablet 1  . metolazone (ZAROXOLYN) 5 MG tablet Take 5 mg by mouth daily as needed (weight gain and edema). 20 tablet 0  . Multiple Vitamin (MULTIVITAMIN WITH MINERALS) TABS tablet Take 1 tablet by mouth at bedtime.     . Multiple Vitamins-Minerals (PRESERVISION AREDS 2) CAPS Take 1 capsule by mouth 2 (two) times daily.     . polyvinyl alcohol (ARTIFICIAL TEARS) 1.4 % ophthalmic solution Place 1 drop into both eyes daily as needed for dry eyes.    . potassium chloride SA (K-DUR,KLOR-CON) 20 MEQ tablet Take 1 tablet (20 mEq total) by mouth 2 (two) times daily. 180 tablet 4  . PRESCRIPTION MEDICATION Inhale into the lungs at bedtime. CPAP    . ranitidine (ZANTAC) 150 MG tablet Take 150 mg by mouth daily as needed for heartburn.     . sertraline (ZOLOFT) 100 MG tablet Take 1 tablet (100 mg total) by mouth daily. 90 tablet 1  . torsemide (DEMADEX) 100 MG tablet Take 100 mg in AM and 50 mg in PM if needed for weight gain 180 tablet 3  . valsartan (DIOVAN) 320 MG tablet TAKE 0.5 TABLETS (160 MG TOTAL) BY MOUTH DAILY. 45 tablet 3  . warfarin (COUMADIN) 5 MG tablet Take 1 tablet (5 mg total) by mouth 2 (two) times daily. 180 tablet 3   No current facility-administered medications for this encounter.     Allergies  Allergen Reactions  . Sunflower Seed [Sunflower Oil] Swelling and Other (See Comments)    Tongue and lip swelling  . Horse-Derived Products Other (See Comments)    Per  allergy skin test  . Tetanus Toxoids Other (See Comments)    Per allergy skin test      Social History   Socioeconomic History  . Marital status: Married    Spouse name: Not on file  . Number of children: 1  . Years of education: Not on file  . Highest education level: Not on file  Social Needs  . Financial resource strain: Not on file  . Food insecurity - worry: Not on file  . Food insecurity - inability: Not on file  . Transportation needs - medical: Not on file  . Transportation needs - non-medical: Not on file  Occupational History  . Occupation: retired Software engineer  Tobacco Use  . Smoking status: Former Smoker    Packs/day: 4.00    Years: 25.00    Pack years: 100.00    Types: Cigarettes    Last attempt to quit: 01/05/1981    Years since quitting: 35.9  . Smokeless tobacco: Never Used  Substance and Sexual Activity  . Alcohol use: Yes    Alcohol/week: 0.6 oz    Types: 1 Shots of liquor per week    Comment: 1-5 drinks per week  . Drug use: No  . Sexual activity: Not on file  Other Topics Concern  . Not on file  Social History Narrative   Daily caffeine-yes   Patient gets regular exercise.   Pt lives in [place] with spouse.  Retired Software engineer.  Family History  Problem Relation Age of Onset  . Dementia Father   . Colon cancer Mother        Family History/Uncle   . Colon polyps Mother        Family History  . Atrial fibrillation Mother   . Hypertension Mother   . Colon polyps Sister        Family history  . Diabetes Maternal Uncle   . Stroke Paternal Uncle     Vitals:   12/16/16 1027  BP: 113/76  Pulse: 76  SpO2: 98%  Weight: 184 lb (83.5 kg)   Wt Readings from Last 3 Encounters:  12/16/16 184 lb (83.5 kg)  12/11/16 181 lb (82.1 kg)  12/09/16 190 lb 12.8 oz (86.5 kg)     PHYSICAL EXAM: General:  Well appearing. No resp difficulty HEENT: normal Neck: supple. JVP 7 Carotids 2+ bilat; no bruits. No lymphadenopathy or thryomegaly appreciated. Cor: PMI nondisplaced. Regular rate & rhythm. Occasional PVCs  No rubs, gallops or murmurs. Lungs: clear Abdomen: soft, nontender, nondistended. No hepatosplenomegaly. No bruits or masses. Good bowel sounds. Extremities: no cyanosis, clubbing, rash, edema Neuro: alert & orientedx3, cranial nerves grossly intact. moves all 4 extremities w/o difficulty. Affect pleasant  EKG : SR 1st degree heart block with PVCs. RBBB  ASSESSMENT  1. Chronic diastolic HF with R>>L symptoms - Volume status now improved.  - Continue torsemide 100 mg daily. Use metolazone only prn. Reinforced need for daily weights and reviewed use of sliding scale diuretics. - Check labs today 2. RV failure & Pulmonary HTN - Has large left to right shunt through large anomalous pulmonary vein into SVC - Have reviewed with TCTS and Dr. Michaelle Birks at [facility]. No way  to repair or baffle. Continue medical therapy 3. CAD s/p CABG - No s/s ischemia - Continue statin. Off ASA due to warfarin 4. AF s/p Maze  - In NSR today.   - On coumadin. Has refused DOAC in past 5. AFL s/p ablation 10/15 6. CKD, stage 3, baseline creatinine 1.7-1.8 - Recheck today 7. Anemia - Up from previous. Reports 3 days of dark stools over holidays. Now resolved. - Check iron stores and stool cards. Likely will need to see GI 8. RBBB - Stable   Glori Bickers, MD  10:38 AM

## 2016-12-16 NOTE — Progress Notes (Signed)
  Echocardiogram 2D Echocardiogram has been performed.  Merrie Roof F 12/16/2016, 10:09 AM

## 2016-12-18 ENCOUNTER — Ambulatory Visit (HOSPITAL_COMMUNITY)
Admission: RE | Admit: 2016-12-18 | Discharge: 2016-12-18 | Disposition: A | Discharge status: Home / Self Care | Payer: Medicare Other | Source: Ambulatory Visit | Attending: Cardiology | Admitting: Cardiology

## 2016-12-18 DIAGNOSIS — D649 Anemia, unspecified: Secondary | ICD-10-CM

## 2016-12-18 LAB — OCCULT BLOOD X 1 CARD TO LAB, STOOL: FECAL OCCULT BLD: NEGATIVE

## 2016-12-22 ENCOUNTER — Telehealth (HOSPITAL_COMMUNITY): Payer: Self-pay | Admitting: *Deleted

## 2016-12-22 DIAGNOSIS — E876 Hypokalemia: Secondary | ICD-10-CM

## 2016-12-22 NOTE — Telephone Encounter (Signed)
-----   Message from Jolaine Artist, MD sent at 12/17/2016 12:37 AM EST ----- Have him take extra kcl 20. Repeat bmet 1 week. He is now off metolazone. Hgb improved

## 2016-12-22 NOTE — Telephone Encounter (Signed)
Notes recorded by Scarlette Calico, RN on 12/22/2016 at 12:35 PM EST Pt aware and states he received message through [place] and increased his KCL for 2 days, he will repeat labs on Fri 12/21 ------  Notes recorded by Scarlette Calico, RN on 12/18/2016 at 4:29 PM EST Left message to call back ------  Notes recorded by Harvie Junior, CMA on 12/18/2016 at 2:53 PM EST Left VM for pt to call for lab results . ------  Notes recorded by Jolaine Artist, MD on 12/17/2016 at 12:37 AM EST Have him take extra kcl 20. Repeat bmet 1 week. He is now off metolazone. Hgb improved

## 2016-12-25 ENCOUNTER — Ambulatory Visit (HOSPITAL_COMMUNITY)
Admission: RE | Admit: 2016-12-25 | Discharge: 2016-12-25 | Disposition: A | Discharge status: Home / Self Care | Payer: Medicare Other | Source: Ambulatory Visit | Attending: Internal Medicine | Admitting: Internal Medicine

## 2016-12-25 DIAGNOSIS — E876 Hypokalemia: Secondary | ICD-10-CM | POA: Insufficient documentation

## 2016-12-25 LAB — BASIC METABOLIC PANEL
ANION GAP: 9 (ref 5–15)
BUN: 51 mg/dL — ABNORMAL HIGH (ref 6–20)
CALCIUM: 8.9 mg/dL (ref 8.9–10.3)
CHLORIDE: 103 mmol/L (ref 101–111)
CO2: 26 mmol/L (ref 22–32)
Creatinine, Ser: 2.62 mg/dL — ABNORMAL HIGH (ref 0.61–1.24)
GFR calc non Af Amer: 22 mL/min — ABNORMAL LOW (ref >60)
GFR, EST AFRICAN AMERICAN: 25 mL/min — AB (ref >60)
Glucose, Bld: 340 mg/dL — ABNORMAL HIGH (ref 65–99)
Potassium: 3.7 mmol/L (ref 3.5–5.1)
SODIUM: 138 mmol/L (ref 135–145)

## 2017-01-03 ENCOUNTER — Other Ambulatory Visit: Payer: Self-pay | Admitting: Internal Medicine

## 2017-01-03 DIAGNOSIS — N183 Chronic kidney disease, stage 3 (moderate): Principal | ICD-10-CM

## 2017-01-03 DIAGNOSIS — E1122 Type 2 diabetes mellitus with diabetic chronic kidney disease: Secondary | ICD-10-CM

## 2017-01-04 ENCOUNTER — Telehealth (HOSPITAL_COMMUNITY): Payer: Self-pay

## 2017-01-04 ENCOUNTER — Ambulatory Visit (INDEPENDENT_AMBULATORY_CARE_PROVIDER_SITE_OTHER): Payer: Medicare Other | Admitting: Adult Health

## 2017-01-04 ENCOUNTER — Encounter: Payer: Self-pay | Admitting: Adult Health

## 2017-01-04 VITALS — BP 106/56 | HR 84 | Temp 97.7°F | Ht 71.0 in | Wt 184.0 lb

## 2017-01-04 DIAGNOSIS — D649 Anemia, unspecified: Secondary | ICD-10-CM

## 2017-01-04 DIAGNOSIS — R195 Other fecal abnormalities: Secondary | ICD-10-CM | POA: Diagnosis not present

## 2017-01-04 DIAGNOSIS — Z1212 Encounter for screening for malignant neoplasm of rectum: Secondary | ICD-10-CM

## 2017-01-04 DIAGNOSIS — I5032 Chronic diastolic (congestive) heart failure: Secondary | ICD-10-CM

## 2017-01-04 DIAGNOSIS — Z1211 Encounter for screening for malignant neoplasm of colon: Secondary | ICD-10-CM

## 2017-01-04 MED ORDER — VALSARTAN 320 MG PO TABS: 160.0000 mg | ORAL_TABLET | Freq: Every day | ORAL | 3 refills | Status: DC

## 2017-01-04 MED ORDER — POTASSIUM CHLORIDE CRYS ER 20 MEQ PO TBCR: 20.0000 meq | EXTENDED_RELEASE_TABLET | Freq: Two times a day (BID) | ORAL | 3 refills | Status: DC

## 2017-01-04 NOTE — Progress Notes (Signed)
Assessment and Plan:  [name] was seen today for melena.  Diagnoses and all orders for this visit:  Dark stools Intermittent ongoing; resolved today; will have him check 3 part hemoccult at home which may catch intermittent bleed better than check here in office  -     CBC with Differential/Platelet -     POC Hemoccult Bld/Stl (3-Cd Home Screen); Future -     Iron,Total/Total Iron Binding Cap -     Ferritin  Anemia, unspecified type -     CBC with Differential/Platelet -     Iron,Total/Total Iron Binding Cap -     Ferritin  Screening for colorectal cancer Due for 10 year follow up colonoscopy - will go ahead and refer in light of possible GI bleed -     Ambulatory referral to Gastroenterology  Further disposition pending results of labs. Discussed med's effects and SE's.   Over 15 minutes of exam, counseling, chart review, and critical decision making was performed.   Future Appointments  Date Time Provider [place]  01/19/2017 11:30 AM [name], MD [facility] None  02/04/2017  8:15 AM Hayden Pedro, MD [facility] None  03/18/2017 10:00 AM Bensimhon, Shaune Pascal, MD [facility] None  04/21/2017  2:00 PM [name], MD [facility] None    ------------------------------------------------------------------------------------------------------------------   HPI BP (!) 106/56   Pulse 84   Temp 97.7 F (36.5 C)   Ht _0  (1.803 m)   Wt 184 lb (83.5 kg)   SpO2 98%   BMI 25.66 kg/m   76 y.o.male presents for concerns of darks stools; he reports intermittent dark dry stools x 3 days. He reports this is resolved today. Recently reported similar darks stools x 3 days over Thanksgiving that resolved spontaneously.  He denies frank blood, taking peptobismol, iron supplement.  He is on coumadin with INR levels below goal 3 weeks ago - dose was adjusted up to 2 tabs (10 mg) 6 days a week and 1.5 tab (7.5 mg) 1 day a week. He has follow up for this dose change in 2 weeks  with Dr. Melford Aase. He endorses intermittent malaise. He denies weakness, CP, dizziness, palpitations - he endorses SOB not significantly worse than baseline for CHF; he reports saturations are stable at home as well. Denies N/V/D, hemorroids, acid reflux flare, frank blood in stools. He reports stool shape/character/frequency are normal for him other than color.   Last colonoscopy noted in 2008 with Dr. Deatra Ina- recommended possible follow up in 2013 due to polypectomy which does not appear was completed.   Past Medical History:  Diagnosis Date  . Adrenal adenoma   . Anxiety   . Atypical atrial flutter ([place]) 8/15, 10/15   a. DCCV 08/2013. b. s/p RFA 10/2013.  [name] Basal cell carcinoma   . CAD (coronary artery disease)    a. 04/2013 CABG x 2: LIMA to LAD, SVG to RI, EVH via R thigh.  . Cellulitis 12/2015   left leg  . Chronic diastolic congestive heart failure ([place])   . CKD (chronic kidney disease), stage III ([place])   . Depression   . Diabetes mellitus type II   . Diverticulosis 2001  . DJD (degenerative joint disease)   . GERD (gastroesophageal reflux disease)   . H/O hiatal hernia   . History of cardioversion    x3 (years uncertain)  . Hx of adenomatous colonic polyps   . Hyperlipidemia   . Hypertension   . Hypertensive cardiomyopathy ([place])   . Obstructive sleep  apnea    compliant with CPAP  . 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 Persistent atrial fibrillation ([place])    a. s/p MAZE 04/2013 in setting of CABG. b. Amio stopped in 10/2013 after flutter ablation.  [name] PFO (patent foramen ovale)    a. Small PFO by TEE 10/2013.  [name] Pleural effusion, left    a. s/p thoracentesis 05/2013.  [name] Respiratory failure ([place])    a. Hypoxia 10/2013 - required supp O2 as inpatient, did not require it at discharge.  . S/P Maze operation for atrial fibrillation    a. 04/2013: Complete bilateral  atrial lesion set using cryothermy and bipolar radiofrequency ablation with clipping of LA appendage (@ time of CABG)  . S/P Maze operation for atrial fibrillation 04/05/2013   Complete bilateral atrial lesion set using cryothermy and bipolar radiofrequency ablation with clipping of LA appendage via median sternotomy approach      Allergies  Allergen Reactions  . Sunflower Seed [Sunflower Oil] Swelling and Other (See Comments)    Tongue and lip swelling  . Horse-Derived Products Other (See Comments)    Per allergy skin test  . Tetanus Toxoids Other (See Comments)    Per allergy skin test    Current Outpatient Medications on File Prior to Visit  Medication Sig  . ACCU-CHEK AVIVA PLUS test strip CHECK BLOOD GLUCOSE 3 TIMES DAILY.  [name] acetaminophen (TYLENOL) 500 MG tablet Take 500-1,000 mg by mouth every 6 (six) hours as needed (pain).  [name] allopurinol (ZYLOPRIM) 300 MG tablet Take 1 tablet daily to prevent gout  . ALPRAZolam (XANAX) 1 MG tablet Take 0.5 tablets (0.5 mg total) by mouth at bedtime as needed for sleep.  [name] Alum Hydroxide-Mag Carbonate (GAVISCON PO) Take 1 tablet by mouth daily as needed (acid reflux).  [name] atorvastatin (LIPITOR) 40 MG tablet Take 1 tablet 3 times weekly  . azelastine (ASTELIN) 0.1 % nasal spray Place 2 sprays into both nostrils 2 (two) times daily. Use in each nostril as directed  . B Complex Vitamins (VITAMIN B COMPLEX PO) Take 1 tablet by mouth at bedtime.   . bacitracin 500 UNIT/GM ointment Apply 1 application topically daily as needed for wound care.  . Blood Glucose Monitoring Suppl (ACCU-CHEK AVIVA PLUS) w/Device KIT Check blood sugar 1 time  daily  . carvedilol (COREG) 6.25 MG tablet TAKE 1 TABLET BY MOUTH TWO  TIMES DAILY  . Cholecalciferol (VITAMIN D-3) 5000 units TABS Take 5,000 Units by mouth at bedtime.  . colchicine 0.6 MG tablet as needed.   . diphenhydrAMINE (BENADRYL) 25 MG tablet Take 25 mg by mouth at bedtime as needed for allergies.   .  fluticasone (FLONASE) 50 MCG/ACT nasal spray Place 1 spray into both nostrils daily as needed for allergies.   [name] glipiZIDE (GLUCOTROL) 10 MG tablet TAKE 1 TABLET 3 TIMES DAILY BEFORE MEALS  . HYDROcodone-acetaminophen (NORCO/VICODIN) 5-325 MG tablet Take 1-2 tablets by mouth every 6 (six) hours as needed for moderate pain.   [name] lidocaine (LIDODERM) 5 % Place 1 patch onto the skin daily as needed (pain). Remove & Discard patch within 12 hours or as directed by MD  . loratadine (CLARITIN) 10 MG tablet Take 10 mg by mouth daily.  . metFORMIN (GLUCOPHAGE-XR) 500 MG 24 hr tablet TAKE 2 TABLETS BY MOUTH TWO TIMES DAILY  . metolazone (ZAROXOLYN) 5 MG tablet Take 5 mg by mouth daily as  needed (weight gain and edema).  . Multiple Vitamin (MULTIVITAMIN WITH MINERALS) TABS tablet Take 1 tablet by mouth at bedtime.   . Multiple Vitamins-Minerals (PRESERVISION AREDS 2) CAPS Take 1 capsule by mouth 2 (two) times daily.   . polyvinyl alcohol (ARTIFICIAL TEARS) 1.4 % ophthalmic solution Place 1 drop into both eyes daily as needed for dry eyes.  . potassium chloride SA (K-DUR,KLOR-CON) 20 MEQ tablet Take 1 tablet (20 mEq total) by mouth 2 (two) times daily. (Patient taking differently: Take 20 mEq by mouth 3 (three) times daily. )  . PRESCRIPTION MEDICATION Inhale into the lungs at bedtime. CPAP  . ranitidine (ZANTAC) 150 MG tablet Take 150 mg by mouth daily as needed for heartburn.   . sertraline (ZOLOFT) 100 MG tablet Take 1 tablet (100 mg total) by mouth daily.  [name] torsemide (DEMADEX) 100 MG tablet Take 100 mg in AM and 50 mg in PM if needed for weight gain  . valsartan (DIOVAN) 320 MG tablet Take 0.5 tablets (160 mg total) by mouth daily.  [name] warfarin (COUMADIN) 5 MG tablet Take 1 tablet (5 mg total) by mouth 2 (two) times daily.   No current facility-administered medications on file prior to visit.     ROS: all negative except above.   Physical Exam:  BP (!) 106/56   Pulse 84   Temp 97.7 F (36.5 C)    Ht _0  (1.803 m)   Wt 184 lb (83.5 kg)   SpO2 98%   BMI 25.66 kg/m   General Appearance: Well nourished, in no apparent distress. Neck: Supple, thyroid normal.  Respiratory: Respiratory effort normal, BS equal bilaterally without rales, rhonchi, wheezing or stridor.  Cardio: S1 with split, S2 normal with no murmur heard. 1+ symmetrical peripheral pulses without edema.  Abdomen: Soft, + BS.  Non tender, no guarding, rebound, no palpable hernias, masses. Lymphatics: Non tender without lymphadenopathy.  Musculoskeletal: Full ROM, 5/5 strength, normal gait.  Skin: Warm, dry without rashes, lesions, ecchymosis.  Neuro: Cranial nerves intact. Normal muscle tone, no cerebellar symptoms. Sensation intact.  Psych: Awake and oriented X 3, normal affect, Insight and Judgment appropriate.     Izora Ribas, NP 4:15 PM [facility]

## 2017-01-04 NOTE — Telephone Encounter (Signed)
Patient calling for refills. Sent to preferred pharmacy electronically.  Renee Pain, RN

## 2017-01-04 NOTE — Progress Notes (Signed)
Assessment and Plan:    Further disposition pending results of labs. Discussed med's effects and SE's.   Over 15 minutes of exam, counseling, chart review, and critical decision making was performed.   Future Appointments  Date Time Provider [place]  01/04/2017  3:30 PM Liane Comber, NP [facility] None  02/04/2017  8:15 AM Hayden Pedro, MD [facility] None  03/18/2017 10:00 AM Bensimhon, Shaune Pascal, MD [facility] None  04/21/2017  2:00 PM [name], MD [facility] None    ------------------------------------------------------------------------------------------------------------------   HPI There were no vitals taken for this visit.  76 y.o.male presents for concerns of darks stools; he reports intermittent dark stools x 3 days. He reports this is resolved today. Recently reported similar darks stools x 3 days over Thanksgiving that   Last colonoscopy noted in 2008 with Dr. Deatra Ina- recommended possible follow up in 2013 due to polypectomy which does not appear was completed.   Past Medical History:  Diagnosis Date  . Adrenal adenoma   . Anxiety   . Atypical atrial flutter ([place]) 8/15, 10/15   a. DCCV 08/2013. b. s/p RFA 10/2013.  [name] Basal cell carcinoma   . CAD (coronary artery disease)    a. 04/2013 CABG x 2: LIMA to LAD, SVG to RI, EVH via R thigh.  . Cellulitis 12/2015   left leg  . Chronic diastolic congestive heart failure ([place])   . CKD (chronic kidney disease), stage III ([place])   . Depression   . Diabetes mellitus type II   . Diverticulosis 2001  . DJD (degenerative joint disease)   . GERD (gastroesophageal reflux disease)   . H/O hiatal hernia   . History of cardioversion    x3 (years uncertain)  . Hx of adenomatous colonic polyps   . Hyperlipidemia   . Hypertension   . Hypertensive cardiomyopathy ([place])   . Obstructive sleep apnea    compliant with CPAP  . 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 Persistent atrial fibrillation ([place])    a. s/p MAZE 04/2013 in setting of CABG. b. Amio stopped in 10/2013 after flutter ablation.  [name] PFO (patent foramen ovale)    a. Small PFO by TEE 10/2013.  [name] Pleural effusion, left    a. s/p thoracentesis 05/2013.  [name] Respiratory failure ([place])    a. Hypoxia 10/2013 - required supp O2 as inpatient, did not require it at discharge.  . S/P Maze operation for atrial fibrillation    a. 04/2013: Complete bilateral atrial lesion set using cryothermy and bipolar radiofrequency ablation with clipping of LA appendage (@ time of CABG)  . S/P Maze operation for atrial fibrillation 04/05/2013   Complete bilateral atrial lesion set using cryothermy and bipolar radiofrequency ablation with clipping of LA appendage via median sternotomy approach      Allergies  Allergen Reactions  . Sunflower Seed [Sunflower Oil] Swelling and Other (See Comments)    Tongue and lip swelling  . Horse-Derived Products Other (See Comments)    Per allergy skin test  . Tetanus Toxoids Other (See Comments)    Per allergy skin test    Current Outpatient Medications on File Prior to Visit  Medication Sig  . ACCU-CHEK AVIVA PLUS test strip CHECK BLOOD GLUCOSE 3 TIMES DAILY.  [name] acetaminophen (TYLENOL) 500 MG tablet Take 500-1,000 mg by mouth every 6 (six) hours as needed (pain).  [name] allopurinol (ZYLOPRIM) 300 MG tablet Take  1 tablet daily to prevent gout  . ALPRAZolam (XANAX) 1 MG tablet Take 0.5 tablets (0.5 mg total) by mouth at bedtime as needed for sleep.  [name] Alum Hydroxide-Mag Carbonate (GAVISCON PO) Take 1 tablet by mouth daily as needed (acid reflux).  [name] atorvastatin (LIPITOR) 40 MG tablet Take 1 tablet 3 times weekly  . azelastine (ASTELIN) 0.1 % nasal spray Place 2 sprays into both nostrils 2 (two) times daily. Use in each nostril as directed  . B Complex Vitamins (VITAMIN B COMPLEX PO) Take 1 tablet by mouth at bedtime.   .  bacitracin 500 UNIT/GM ointment Apply 1 application topically daily as needed for wound care.  . Blood Glucose Monitoring Suppl (ACCU-CHEK AVIVA PLUS) w/Device KIT Check blood sugar 1 time  daily  . carvedilol (COREG) 6.25 MG tablet TAKE 1 TABLET BY MOUTH TWO  TIMES DAILY  . Cholecalciferol (VITAMIN D-3) 5000 units TABS Take 5,000 Units by mouth at bedtime.  . colchicine 0.6 MG tablet as needed.   . diphenhydrAMINE (BENADRYL) 25 MG tablet Take 25 mg by mouth at bedtime as needed for allergies.   . fluticasone (FLONASE) 50 MCG/ACT nasal spray Place 1 spray into both nostrils daily as needed for allergies.   [name] glipiZIDE (GLUCOTROL) 10 MG tablet TAKE 1 TABLET 3 TIMES DAILY BEFORE MEALS  . HYDROcodone-acetaminophen (NORCO/VICODIN) 5-325 MG tablet Take 1-2 tablets by mouth every 6 (six) hours as needed for moderate pain.   [name] lidocaine (LIDODERM) 5 % Place 1 patch onto the skin daily as needed (pain). Remove & Discard patch within 12 hours or as directed by MD  . loratadine (CLARITIN) 10 MG tablet Take 10 mg by mouth daily.  . metFORMIN (GLUCOPHAGE-XR) 500 MG 24 hr tablet TAKE 2 TABLETS BY MOUTH TWO TIMES DAILY  . metolazone (ZAROXOLYN) 5 MG tablet Take 5 mg by mouth daily as needed (weight gain and edema).  . Multiple Vitamin (MULTIVITAMIN WITH MINERALS) TABS tablet Take 1 tablet by mouth at bedtime.   . Multiple Vitamins-Minerals (PRESERVISION AREDS 2) CAPS Take 1 capsule by mouth 2 (two) times daily.   . polyvinyl alcohol (ARTIFICIAL TEARS) 1.4 % ophthalmic solution Place 1 drop into both eyes daily as needed for dry eyes.  . potassium chloride SA (K-DUR,KLOR-CON) 20 MEQ tablet Take 1 tablet (20 mEq total) by mouth 2 (two) times daily.  [name] PRESCRIPTION MEDICATION Inhale into the lungs at bedtime. CPAP  . ranitidine (ZANTAC) 150 MG tablet Take 150 mg by mouth daily as needed for heartburn.   . sertraline (ZOLOFT) 100 MG tablet Take 1 tablet (100 mg total) by mouth daily.  [name] torsemide (DEMADEX) 100 MG  tablet Take 100 mg in AM and 50 mg in PM if needed for weight gain  . valsartan (DIOVAN) 320 MG tablet Take 0.5 tablets (160 mg total) by mouth daily.  [name] warfarin (COUMADIN) 5 MG tablet Take 1 tablet (5 mg total) by mouth 2 (two) times daily.   No current facility-administered medications on file prior to visit.     ROS: all negative except above.   Physical Exam:  There were no vitals taken for this visit.  General Appearance: Well nourished, in no apparent distress. Eyes: PERRLA, EOMs, conjunctiva no swelling or erythema Sinuses: No Frontal/maxillary tenderness ENT/Mouth: Ext aud canals clear, TMs without erythema, bulging. No erythema, swelling, or exudate on post pharynx.  Tonsils not swollen or erythematous. Hearing normal.  Neck: Supple, thyroid normal.  Respiratory: Respiratory effort normal, BS equal bilaterally without  rales, rhonchi, wheezing or stridor.  Cardio: RRR with no MRGs. Brisk peripheral pulses without edema.  Abdomen: Soft, + BS.  Non tender, no guarding, rebound, hernias, masses. Lymphatics: Non tender without lymphadenopathy.  Musculoskeletal: Full ROM, 5/5 strength, normal gait.  Skin: Warm, dry without rashes, lesions, ecchymosis.  Neuro: Cranial nerves intact. Normal muscle tone, no cerebellar symptoms. Sensation intact.  Psych: Awake and oriented X 3, normal affect, Insight and Judgment appropriate.     Izora Ribas, NP 3:04 PM [facility]

## 2017-01-05 LAB — CBC WITH DIFFERENTIAL/PLATELET
BASOS PCT: 0.4 %
Basophils Absolute: 32 cells/uL (ref 0–200)
EOS PCT: 1.1 %
Eosinophils Absolute: 89 cells/uL (ref 15–500)
HEMATOCRIT: 25.6 % — AB (ref 38.5–50.0)
Hemoglobin: 8.4 g/dL — ABNORMAL LOW (ref 13.2–17.1)
LYMPHS ABS: 859 {cells}/uL (ref 850–3900)
MCH: 30.3 pg (ref 27.0–33.0)
MCHC: 32.8 g/dL (ref 32.0–36.0)
MCV: 92.4 fL (ref 80.0–100.0)
MPV: 9.2 fL (ref 7.5–12.5)
Monocytes Relative: 6.6 %
NEUTROS ABS: 6585 {cells}/uL (ref 1500–7800)
Neutrophils Relative %: 81.3 %
Platelets: 257 10*3/uL (ref 140–400)
RBC: 2.77 10*6/uL — AB (ref 4.20–5.80)
RDW: 15.3 % — ABNORMAL HIGH (ref 11.0–15.0)
Total Lymphocyte: 10.6 %
WBC: 8.1 10*3/uL (ref 3.8–10.8)
WBCMIX: 535 {cells}/uL (ref 200–950)

## 2017-01-05 LAB — IRON, TOTAL/TOTAL IRON BINDING CAP
%SAT: 6 % — AB (ref 15–60)
Iron: 25 ug/dL — ABNORMAL LOW (ref 50–180)
TIBC: 436 ug/dL — AB (ref 250–425)

## 2017-01-05 LAB — FERRITIN: Ferritin: 46 ng/mL (ref 20–380)

## 2017-01-06 ENCOUNTER — Telehealth: Payer: Self-pay | Admitting: Adult Health

## 2017-01-06 ENCOUNTER — Ambulatory Visit (INDEPENDENT_AMBULATORY_CARE_PROVIDER_SITE_OTHER): Payer: Medicare Other

## 2017-01-06 VITALS — BP 102/58 | HR 76 | Temp 97.5°F

## 2017-01-06 DIAGNOSIS — D649 Anemia, unspecified: Secondary | ICD-10-CM | POA: Diagnosis not present

## 2017-01-06 LAB — CBC WITH DIFFERENTIAL/PLATELET
BASOS ABS: 28 {cells}/uL (ref 0–200)
BASOS PCT: 0.4 %
EOS ABS: 107 {cells}/uL (ref 15–500)
EOS PCT: 1.5 %
HEMATOCRIT: 25.9 % — AB (ref 38.5–50.0)
HEMOGLOBIN: 8.2 g/dL — AB (ref 13.2–17.1)
LYMPHS ABS: 767 {cells}/uL — AB (ref 850–3900)
MCH: 29.4 pg (ref 27.0–33.0)
MCHC: 31.7 g/dL — AB (ref 32.0–36.0)
MCV: 92.8 fL (ref 80.0–100.0)
MPV: 9 fL (ref 7.5–12.5)
Monocytes Relative: 7.7 %
NEUTROS ABS: 5652 {cells}/uL (ref 1500–7800)
Neutrophils Relative %: 79.6 %
Platelets: 220 10*3/uL (ref 140–400)
RBC: 2.79 10*6/uL — ABNORMAL LOW (ref 4.20–5.80)
RDW: 15 % (ref 11.0–15.0)
Total Lymphocyte: 10.8 %
WBC mixed population: 547 cells/uL (ref 200–950)
WBC: 7.1 10*3/uL (ref 3.8–10.8)

## 2017-01-06 NOTE — Telephone Encounter (Signed)
Called to notify of Hgb drop 10.4 to 8.4; requested he stop taking coumadin, come in for NV for VS and stat CBC. Discussed will have him wait on the results and present to ER for possible transfusion if Hgb below 8. He expresses understanding, denies further signs of melena or hematochezia when questioned.

## 2017-01-06 NOTE — Progress Notes (Signed)
Patient presents to the office for a STAT CBC and vital check. Vitals were as follows: BP-102/58, P-76, O2-96%,  T-97.5

## 2017-01-07 ENCOUNTER — Ambulatory Visit: Payer: Medicare Other | Admitting: Gastroenterology

## 2017-01-07 ENCOUNTER — Telehealth: Payer: Self-pay

## 2017-01-07 ENCOUNTER — Encounter: Payer: Self-pay | Admitting: Gastroenterology

## 2017-01-07 VITALS — BP 120/70 | HR 76 | Ht 70.0 in | Wt 183.1 lb

## 2017-01-07 DIAGNOSIS — I480 Paroxysmal atrial fibrillation: Secondary | ICD-10-CM | POA: Diagnosis not present

## 2017-01-07 DIAGNOSIS — D509 Iron deficiency anemia, unspecified: Secondary | ICD-10-CM

## 2017-01-07 DIAGNOSIS — Z9989 Dependence on other enabling machines and devices: Secondary | ICD-10-CM | POA: Diagnosis not present

## 2017-01-07 DIAGNOSIS — I2510 Atherosclerotic heart disease of native coronary artery without angina pectoris: Secondary | ICD-10-CM

## 2017-01-07 DIAGNOSIS — N183 Chronic kidney disease, stage 3 unspecified: Secondary | ICD-10-CM

## 2017-01-07 DIAGNOSIS — G4733 Obstructive sleep apnea (adult) (pediatric): Secondary | ICD-10-CM

## 2017-01-07 DIAGNOSIS — Z7901 Long term (current) use of anticoagulants: Secondary | ICD-10-CM | POA: Diagnosis not present

## 2017-01-07 DIAGNOSIS — I272 Pulmonary hypertension, unspecified: Secondary | ICD-10-CM | POA: Diagnosis not present

## 2017-01-07 MED ORDER — PEG-KCL-NACL-NASULF-NA ASC-C 140 G PO SOLR: 140.0000 g | ORAL | 0 refills | Status: DC

## 2017-01-07 NOTE — Progress Notes (Signed)
   [facility] Progress Note  Chief Complaint: Anemia  Subjective  History:  [name] was referred by primary care for evaluation of recent reports of "dark stool" and decreased hemoglobin.  He was last seen in our clinic in July 2016. Recently, [name] reported an episode of dark stool that occurred around Thanksgiving.  He is not sure the cause, perhaps was something he ate or change in stool character that sometimes occurs when he increase his dose of diuretics.  He saw cardiology and when he mentioned this, they check his hemoglobin and was found to be 12.3.  He tells me he had another 2 or 3-day episode of some "dark stool" last week. He saw primary care earlier this week, and hemoglobin had dropped some more.  A recent outpatient FOBT test was negative. [name] denies abdominal pain, nausea, vomiting, early satiety.  His weight fluctuates with his volume status.  ROS: Cardiovascular:  no chest pain Respiratory: Dyspnea with exertion  The patient's Past Medical, Family and Social History were reviewed and are on file in the EMR.  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 Objective:  Med list reviewed  Current Outpatient Medications:  .  ACCU-CHEK AVIVA PLUS test strip, CHECK BLOOD GLUCOSE 3 TIMES DAILY., Disp: 300 each, Rfl: 1 .  acetaminophen (TYLENOL) 500 MG tablet, Take 500-1,000 mg by mouth every 6 (six) hours as needed (pain)., Disp: , Rfl:  .  allopurinol (ZYLOPRIM) 300 MG tablet, Take 1 tablet daily to prevent gout, Disp: 90 tablet, Rfl: 1 .  ALPRAZolam (XANAX) 1 MG tablet, Take 0.5 tablets (0.5 mg total) by mouth at bedtime as needed for sleep., Disp: 60 tablet, Rfl: 0 .  Alum Hydroxide-Mag Carbonate (GAVISCON PO), Take 1 tablet by mouth daily as needed (acid reflux)., Disp: , Rfl:  .  atorvastatin (LIPITOR) 40 MG tablet, Take 1 tablet  3 times weekly, Disp: , Rfl:  .  azelastine (ASTELIN) 0.1 % nasal spray, Place 2 sprays into both nostrils 2 (two) times daily. Use in each nostril as directed, Disp: 30 mL, Rfl: 2 .  B Complex Vitamins (VITAMIN B COMPLEX PO), Take 1 tablet by mouth at bedtime. , Disp: , Rfl:  .  bacitracin 500 UNIT/GM ointment, Apply 1 application topically daily as needed for wound care., Disp: , Rfl:  .  Blood Glucose Monitoring Suppl (ACCU-CHEK AVIVA PLUS) w/Device KIT, Check blood sugar 1 time  daily, Disp: 1 kit, Rfl: 0 .  carvedilol (COREG) 6.25 MG tablet, TAKE 1 TABLET BY MOUTH TWO  TIMES DAILY, Disp: 180 tablet, Rfl: 3 .  Cholecalciferol (VITAMIN D-3) 5000 units TABS, Take 5,000 Units by mouth at bedtime., Disp: , Rfl:  .  colchicine 0.6 MG tablet, as needed. , Disp: , Rfl:  .  diphenhydrAMINE (BENADRYL) 25 MG tablet, Take 25 mg by mouth at bedtime as needed for allergies. , Disp: , Rfl:  .  fluticasone (FLONASE) 50 MCG/ACT nasal spray, Place 1 spray into both nostrils daily as needed for allergies. , Disp: , Rfl:  .  glipiZIDE (GLUCOTROL) 10 MG tablet, TAKE 1 TABLET 3 TIMES DAILY BEFORE MEALS, Disp: 270 tablet, Rfl: 1 .  HYDROcodone-acetaminophen (NORCO/VICODIN) 5-325 MG tablet, Take 1-2 tablets by mouth every 6 (six) hours as needed for moderate pain. , Disp: , Rfl:  .  lidocaine (LIDODERM) 5 %, Place 1 patch   onto the skin daily as needed (pain). Remove & Discard patch within 12 hours or as directed by MD, Disp: , Rfl:  .  loratadine (CLARITIN) 10 MG tablet, Take 10 mg by mouth daily., Disp: , Rfl:  .  metFORMIN (GLUCOPHAGE-XR) 500 MG 24 hr tablet, TAKE 2 TABLETS BY MOUTH TWO TIMES DAILY, Disp: 360 tablet, Rfl: 1 .  metolazone (ZAROXOLYN) 5 MG tablet, Take 5 mg by mouth daily as needed (weight gain and edema)., Disp: 20 tablet, Rfl: 0 .  Multiple Vitamin (MULTIVITAMIN WITH MINERALS) TABS tablet, Take 1 tablet by mouth at bedtime. , Disp: , Rfl:  .  Multiple Vitamins-Minerals (PRESERVISION AREDS 2) CAPS,  Take 1 capsule by mouth 2 (two) times daily. , Disp: , Rfl:  .  polyvinyl alcohol (ARTIFICIAL TEARS) 1.4 % ophthalmic solution, Place 1 drop into both eyes daily as needed for dry eyes., Disp: , Rfl:  .  potassium chloride SA (K-DUR,KLOR-CON) 20 MEQ tablet, Take 1 tablet (20 mEq total) by mouth 2 (two) times daily. (Patient taking differently: Take 20 mEq by mouth 3 (three) times daily. ), Disp: 180 tablet, Rfl: 3 .  PRESCRIPTION MEDICATION, Inhale into the lungs at bedtime. CPAP, Disp: , Rfl:  .  ranitidine (ZANTAC) 150 MG tablet, Take 150 mg by mouth daily as needed for heartburn. , Disp: , Rfl:  .  sertraline (ZOLOFT) 100 MG tablet, Take 1 tablet (100 mg total) by mouth daily., Disp: 90 tablet, Rfl: 1 .  torsemide (DEMADEX) 100 MG tablet, Take 100 mg in AM and 50 mg in PM if needed for weight gain, Disp: 180 tablet, Rfl: 3 .  valsartan (DIOVAN) 320 MG tablet, Take 0.5 tablets (160 mg total) by mouth daily., Disp: 45 tablet, Rfl: 3 .  PEG-KCl-NaCl-NaSulf-Na Asc-C (PLENVU) 140 g SOLR, Take 140 g by mouth as directed., Disp: 1 each, Rfl: 0   Vital signs in last 24 hrs: Vitals:   01/07/17 0934  BP: 120/70  Pulse: 76    Physical Exam  Pleasant elderly man, no acute distress, conversational and appropriate.  HEENT: sclera anicteric, oral mucosa moist without lesions  Neck: supple, no thyromegaly, JVD or lymphadenopathy  Cardiac: RRR without murmurs, elevated JVP , S1S2 heard, no peripheral edema  Pulm: clear to auscultation bilaterally, normal RR and effort noted  Abdomen: soft, no tenderness, with active bowel sounds. No guarding or palpable hepatosplenomegaly.  No obvious ascites  Skin; warm and dry, no jaundice or rash  Recent Labs:  CBC Latest Ref Rng & Units 01/06/2017 01/04/2017 12/16/2016  WBC 3.8 - 10.8 Thousand/uL 7.1 8.1 7.8  Hemoglobin 13.2 - 17.1 g/dL 8.2(L) 8.4(L) 10.4(L)  Hematocrit 38.5 - 50.0 % 25.9(L) 25.6(L) 33.0(L)  Platelets 140 - 400 Thousand/uL 220 257 240    MCV/RDW nml  CMP Latest Ref Rng & Units 12/25/2016 12/16/2016 12/11/2016  Glucose 65 - 99 mg/dL 340(H) 336(H) 275(H)  BUN 6 - 20 mg/dL 51(H) 64(H) 44(H)  Creatinine 0.61 - 1.24 mg/dL 2.62(H) 2.59(H) 2.17(H)  Sodium 135 - 145 mmol/L 138 136 139  Potassium 3.5 - 5.1 mmol/L 3.7 3.4(L) 3.5  Chloride 101 - 111 mmol/L 103 93(L) 96(L)  CO2 22 - 32 mmol/L 26 29 31  Calcium 8.9 - 10.3 mg/dL 8.9 9.3 9.6  Total Protein 6.1 - 8.1 g/dL - - 7.0  Total Bilirubin 0.2 - 1.2 mg/dL - - 0.8  Alkaline Phos 40 - 115 U/L - - -  AST 10 - 35 U/L - - 14  ALT   9 - 46 U/L - - 16    FOBT negative 12/18/16 Also negative April 2018 and march 2017   Iron 25, tibc high at 436 6 % sat Ferritin 46   Sigmoidoscopy Dr. Kaplan 08/2008 - diverticulosis Colonoscopy to ileum 12/2006 (for swallowed dental bridge) - "polyp" in cecum - 6mm normal colon tissue  @[email or web address]@ Assessment: Encounter Diagnoses  Name Primary?  . Iron deficiency anemia, unspecified iron deficiency anemia type Yes  . Coronary artery disease involving native coronary artery of native heart without angina pectoris   . Pulmonary hypertension (HCC)   . PAF (paroxysmal atrial fibrillation) (HCC)   . OSA on CPAP   . CKD (chronic kidney disease) stage 3, GFR 30-59 ml/min (HCC)   . Chronic anticoagulation     Despite his recent reports of change in stool character, it is not clear to me that his iron deficiency anemia is from chronic GI blood loss.  His stool was negative for occult blood, though perhaps he is losing blood intermittently.  If so, one would not expect that to cause a 2 g hemoglobin drop in a short period of time.  Overall, I suspect the majority if not all of his anemia is due to chronic kidney disease. At his age, and on anticoagulation, with his cardiopulmonary issues, I feel we should be certain he is not having a source of GI blood loss contributing to this anemia. Plan: I had him start iron sulfate 325 mg twice  daily. We scheduled him an EGD and colonoscopy in a few weeks, allowing time for his hemoglobin to improve on iron.  He needs to be off warfarin several days prior, and we will clear this with his cardiologist.   The benefits and risks of the planned procedure were described in detail with the patient or (when appropriate) their health care proxy.  Risks were outlined as including, but not limited to, bleeding, infection, perforation, adverse medication reaction leading to cardiac or pulmonary decompensation, or pancreatitis (if ERCP).  The limitation of incomplete mucosal visualization was also discussed.  No guarantees or warranties were given.  Patient at increased risk for cardiopulmonary complications of procedure due to medical comorbidities.  I will plan to perform them in the hospital endoscopy lab.  I think primary care should consider erythropoietin once his iron stores are repleted.   Total time 45 minutes, over half spent in counseling and coordination of care.   [name] III   CC. Dan Bensimohn, MD Ashley Corbett, NP  

## 2017-01-07 NOTE — Patient Instructions (Addendum)
If you are age 78 or older, your body mass index should be between 23-30. Your Body mass index is 26.28 kg/m. If this is out of the aforementioned range listed, please consider follow up with your Primary Care Provider.  If you are age 16 or younger, your body mass index should be between 19-25. Your Body mass index is 26.28 kg/m. If this is out of the aformentioned range listed, please consider follow up with your Primary Care Provider.   You have been scheduled for an endoscopy and colonoscopy. Please follow the written instructions given to you at your visit today. Please pick up your prep supplies at the pharmacy within the next 1-3 days. If you use inhalers (even only as needed), please bring them with you on the day of your procedure. Your physician has requested that you go to www.startemmi.com and enter the access code given to you at your visit today. This web site gives a general overview about your procedure. However, you should still follow specific instructions given to you by our office regarding your preparation for the procedure.  You will be contacted by our office prior to your procedure for directions on holding your coumadin.  If you do not hear from our office 1 week prior to your scheduled procedure, please call [phone] to discuss.   Thank you for choosing [facility]  Dr Wilfrid Lund III

## 2017-01-07 NOTE — Telephone Encounter (Signed)
We do not have warfarin on patient's medication list - please clarify that patient is actually taking this. Will also need to find out who is managing his warfarin if he's taking it. Dr [name] does have warfarin listed in his most recent note.  Pt has atrial flutter and atrial fibrillation with CHADS2VASc score of 6 (age x2, CHF, HTN, CAD, DM). Also with hx of small PFO and pulmonary HTN. Would recommend holding warfarin for 3-4 days prior due to elevated cardiac risk, although risk is not high enough to warrant Lovenox bridge per our protocol.

## 2017-01-07 NOTE — H&P (View-Only) (Signed)
[facility] Progress Note  Chief Complaint: Anemia  Subjective  History:  [name] was referred by primary care for evaluation of recent reports of "dark stool" and decreased hemoglobin.  He was last seen in our clinic in July 2016. Recently, [name] reported an episode of dark stool that occurred around Thanksgiving.  He is not sure the cause, perhaps was something he ate or change in stool character that sometimes occurs when he increase his dose of diuretics.  He saw cardiology and when he mentioned this, they check his hemoglobin and was found to be 12.3.  He tells me he had another 2 or 3-day episode of some "dark stool" last week. He saw primary care earlier this week, and hemoglobin had dropped some more.  A recent outpatient FOBT test was negative. [name] denies abdominal pain, nausea, vomiting, early satiety.  His weight fluctuates with his volume status.  ROS: Cardiovascular:  no chest pain Respiratory: Dyspnea with exertion  The patient's Past Medical, Family and Social History were reviewed and are on file in the EMR.  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 Objective:  Med list reviewed  Current Outpatient Medications:  .  ACCU-CHEK AVIVA PLUS test strip, CHECK BLOOD GLUCOSE 3 TIMES DAILY., Disp: 300 each, Rfl: 1 .  acetaminophen (TYLENOL) 500 MG tablet, Take 500-1,000 mg by mouth every 6 (six) hours as needed (pain)., Disp: , Rfl:  .  allopurinol (ZYLOPRIM) 300 MG tablet, Take 1 tablet daily to prevent gout, Disp: 90 tablet, Rfl: 1 .  ALPRAZolam (XANAX) 1 MG tablet, Take 0.5 tablets (0.5 mg total) by mouth at bedtime as needed for sleep., Disp: 60 tablet, Rfl: 0 .  Alum Hydroxide-Mag Carbonate (GAVISCON PO), Take 1 tablet by mouth daily as needed (acid reflux)., Disp: , Rfl:  .  atorvastatin (LIPITOR) 40 MG tablet, Take 1 tablet  3 times weekly, Disp: , Rfl:  .  azelastine (ASTELIN) 0.1 % nasal spray, Place 2 sprays into both nostrils 2 (two) times daily. Use in each nostril as directed, Disp: 30 mL, Rfl: 2 .  B Complex Vitamins (VITAMIN B COMPLEX PO), Take 1 tablet by mouth at bedtime. , Disp: , Rfl:  .  bacitracin 500 UNIT/GM ointment, Apply 1 application topically daily as needed for wound care., Disp: , Rfl:  .  Blood Glucose Monitoring Suppl (ACCU-CHEK AVIVA PLUS) w/Device KIT, Check blood sugar 1 time  daily, Disp: 1 kit, Rfl: 0 .  carvedilol (COREG) 6.25 MG tablet, TAKE 1 TABLET BY MOUTH TWO  TIMES DAILY, Disp: 180 tablet, Rfl: 3 .  Cholecalciferol (VITAMIN D-3) 5000 units TABS, Take 5,000 Units by mouth at bedtime., Disp: , Rfl:  .  colchicine 0.6 MG tablet, as needed. , Disp: , Rfl:  .  diphenhydrAMINE (BENADRYL) 25 MG tablet, Take 25 mg by mouth at bedtime as needed for allergies. , Disp: , Rfl:  .  fluticasone (FLONASE) 50 MCG/ACT nasal spray, Place 1 spray into both nostrils daily as needed for allergies. , Disp: , Rfl:  .  glipiZIDE (GLUCOTROL) 10 MG tablet, TAKE 1 TABLET 3 TIMES DAILY BEFORE MEALS, Disp: 270 tablet, Rfl: 1 .  HYDROcodone-acetaminophen (NORCO/VICODIN) 5-325 MG tablet, Take 1-2 tablets by mouth every 6 (six) hours as needed for moderate pain. , Disp: , Rfl:  .  lidocaine (LIDODERM) 5 %, Place 1 patch  onto the skin daily as needed (pain). Remove & Discard patch within 12 hours or as directed by MD, Disp: , Rfl:  .  loratadine (CLARITIN) 10 MG tablet, Take 10 mg by mouth daily., Disp: , Rfl:  .  metFORMIN (GLUCOPHAGE-XR) 500 MG 24 hr tablet, TAKE 2 TABLETS BY MOUTH TWO TIMES DAILY, Disp: 360 tablet, Rfl: 1 .  metolazone (ZAROXOLYN) 5 MG tablet, Take 5 mg by mouth daily as needed (weight gain and edema)., Disp: 20 tablet, Rfl: 0 .  Multiple Vitamin (MULTIVITAMIN WITH MINERALS) TABS tablet, Take 1 tablet by mouth at bedtime. , Disp: , Rfl:  .  Multiple Vitamins-Minerals (PRESERVISION AREDS 2) CAPS,  Take 1 capsule by mouth 2 (two) times daily. , Disp: , Rfl:  .  polyvinyl alcohol (ARTIFICIAL TEARS) 1.4 % ophthalmic solution, Place 1 drop into both eyes daily as needed for dry eyes., Disp: , Rfl:  .  potassium chloride SA (K-DUR,KLOR-CON) 20 MEQ tablet, Take 1 tablet (20 mEq total) by mouth 2 (two) times daily. (Patient taking differently: Take 20 mEq by mouth 3 (three) times daily. ), Disp: 180 tablet, Rfl: 3 .  PRESCRIPTION MEDICATION, Inhale into the lungs at bedtime. CPAP, Disp: , Rfl:  .  ranitidine (ZANTAC) 150 MG tablet, Take 150 mg by mouth daily as needed for heartburn. , Disp: , Rfl:  .  sertraline (ZOLOFT) 100 MG tablet, Take 1 tablet (100 mg total) by mouth daily., Disp: 90 tablet, Rfl: 1 .  torsemide (DEMADEX) 100 MG tablet, Take 100 mg in AM and 50 mg in PM if needed for weight gain, Disp: 180 tablet, Rfl: 3 .  valsartan (DIOVAN) 320 MG tablet, Take 0.5 tablets (160 mg total) by mouth daily., Disp: 45 tablet, Rfl: 3 .  PEG-KCl-NaCl-NaSulf-Na Asc-C (PLENVU) 140 g SOLR, Take 140 g by mouth as directed., Disp: 1 each, Rfl: 0   Vital signs in last 24 hrs: Vitals:   01/07/17 0934  BP: 120/70  Pulse: 76    Physical Exam  Pleasant elderly man, no acute distress, conversational and appropriate.  HEENT: sclera anicteric, oral mucosa moist without lesions  Neck: supple, no thyromegaly, JVD or lymphadenopathy  Cardiac: RRR without murmurs, elevated JVP , S1S2 heard, no peripheral edema  Pulm: clear to auscultation bilaterally, normal RR and effort noted  Abdomen: soft, no tenderness, with active bowel sounds. No guarding or palpable hepatosplenomegaly.  No obvious ascites  Skin; warm and dry, no jaundice or rash  Recent Labs:  CBC Latest Ref Rng & Units 01/06/2017 01/04/2017 12/16/2016  WBC 3.8 - 10.8 Thousand/uL 7.1 8.1 7.8  Hemoglobin 13.2 - 17.1 g/dL 8.2(L) 8.4(L) 10.4(L)  Hematocrit 38.5 - 50.0 % 25.9(L) 25.6(L) 33.0(L)  Platelets 140 - 400 Thousand/uL 220 257 240    MCV/RDW nml  CMP Latest Ref Rng & Units 12/25/2016 12/16/2016 12/11/2016  Glucose 65 - 99 mg/dL 340(H) 336(H) 275(H)  BUN 6 - 20 mg/dL 51(H) 64(H) 44(H)  Creatinine 0.61 - 1.24 mg/dL 2.62(H) 2.59(H) 2.17(H)  Sodium 135 - 145 mmol/L 138 136 139  Potassium 3.5 - 5.1 mmol/L 3.7 3.4(L) 3.5  Chloride 101 - 111 mmol/L 103 93(L) 96(L)  CO2 22 - 32 mmol/L _0 Calcium 8.9 - 10.3 mg/dL 8.9 9.3 9.6  Total Protein 6.1 - 8.1 g/dL - - 7.0  Total Bilirubin 0.2 - 1.2 mg/dL - - 0.8  Alkaline Phos 40 - 115 U/L - - -  AST 10 - 35 U/L - - 14  ALT  9 - 46 U/L - - 16    FOBT negative 12/18/16 Also negative April 2018 and march 2017   Iron 25, tibc high at 436 6 % sat Ferritin 46   Sigmoidoscopy Dr. Deatra Ina 08/2008 - diverticulosis Colonoscopy to ileum 12/2006 (for swallowed dental bridge) - "polyp" in cecum - 52m normal colon tissue  _0 @ Assessment: Encounter Diagnoses  Name Primary?  . Iron deficiency anemia, unspecified iron deficiency anemia type Yes  . Coronary artery disease involving native coronary artery of native heart without angina pectoris   . Pulmonary hypertension ([facility]   . PAF (paroxysmal atrial fibrillation) ([facility]   . OSA on CPAP   . CKD (chronic kidney disease) stage 3, GFR 30-59 ml/min (HCC)   . Chronic anticoagulation     Despite his recent reports of change in stool character, it is not clear to me that his iron deficiency anemia is from chronic GI blood loss.  His stool was negative for occult blood, though perhaps he is losing blood intermittently.  If so, one would not expect that to cause a 2 g hemoglobin drop in a short period of time.  Overall, I suspect the majority if not all of his anemia is due to chronic kidney disease. At his age, and on anticoagulation, with his cardiopulmonary issues, I feel we should be certain he is not having a source of GI blood loss contributing to this anemia. Plan: I had him start iron sulfate 325 mg twice  daily. We scheduled him an EGD and colonoscopy in a few weeks, allowing time for his hemoglobin to improve on iron.  He needs to be off warfarin several days prior, and we will clear this with his cardiologist.   The benefits and risks of the planned procedure were described in detail with the patient or (when appropriate) their health care proxy.  Risks were outlined as including, but not limited to, bleeding, infection, perforation, adverse medication reaction leading to cardiac or pulmonary decompensation, or pancreatitis (if ERCP).  The limitation of incomplete mucosal visualization was also discussed.  No guarantees or warranties were given.  Patient at increased risk for cardiopulmonary complications of procedure due to medical comorbidities.  I will plan to perform them in the hospital endoscopy lab.  I think primary care should consider erythropoietin once his iron stores are repleted.   Total time 45 minutes, over half spent in counseling and coordination of care.   HNelida MeuseIII   CC. DBeverley Fiedler MD ALiane Comber NP

## 2017-01-07 NOTE — Telephone Encounter (Signed)
  We have scheduled the above patient for an endoscopic procedure (Colon/EGD). Our records show that he is on anticoagulation therapy.   Please advise as to how long the patient may come off his therapy of coumadin prior to the procedure, which is scheduled for 01-26-2017.

## 2017-01-08 ENCOUNTER — Telehealth: Payer: Self-pay | Admitting: *Deleted

## 2017-01-08 ENCOUNTER — Other Ambulatory Visit: Payer: Self-pay

## 2017-01-08 NOTE — Telephone Encounter (Signed)
Spoke with Magdalene River, CMA @ [facility]. She has been made aware that we don't follow pts Coumadin and that she will need to follow up with pts PCP to see if they do.  Will advise Vivien Rota, per pharmacy recommendations on pts coumadin.

## 2017-01-08 NOTE — Telephone Encounter (Signed)
Spoke with Magdalene River, CMA @ [facility], she has been advised that we do not manage pts Coumadin and she would need to reach out to pts pcp re clearance.  She thanked me for the call.

## 2017-01-08 NOTE — Telephone Encounter (Signed)
Patient calling to speak with someone regarding this.

## 2017-01-08 NOTE — Telephone Encounter (Signed)
Placed call to pt to clarify who is managing his Coumadin. I left a message for him to call back.

## 2017-01-08 NOTE — Telephone Encounter (Signed)
New Message ° ° °Pt returning call °

## 2017-01-08 NOTE — Telephone Encounter (Signed)
Patient is currently holding his coumadin due to his anemia. He was ordered to stop it on 01-06-2017.

## 2017-01-11 ENCOUNTER — Telehealth: Payer: Self-pay | Admitting: Gastroenterology

## 2017-01-11 NOTE — Telephone Encounter (Signed)
Pt has been notified and aware to continue holding the coumadin as previously instructed by his primary care. Pt agrees and states understanding.

## 2017-01-11 NOTE — Telephone Encounter (Signed)
Patient aware of note about bridge therapy as discussed. Patient states that he is scheduled for endoscopy and colonoscopy at the end of the month.

## 2017-01-11 NOTE — Telephone Encounter (Signed)
Please call patient and let him know that Dr. Melford Aase and I have discussed this, and in light of recent dramatic drop in hemoglobin we recommend he proceed without bridge therapy until GI clears him of tumor or bleeding ulcer. We can restart coumadin at previous dose with close follow up once they verify this on endoscopy/colonoscopy.

## 2017-01-11 NOTE — Telephone Encounter (Signed)
[name],    Thank you for asking me to clarify the anti-coagulation plan for [name] upcoming procedures.  I reviewed your message stream with other clinical staff.  There seems to be some misunderstanding, as I did not instruct [name] to stop his coumadin at last week's visit.  To clarify: he only needs to stop coumadin 4 days prior to his upcoming endoscopic procedures.  Per usual, we need to inform whomever manages his anti-coagulation so they can arrange bridging therapy, if needed,  and communicate final plan back to the patient.  - HD

## 2017-01-18 ENCOUNTER — Other Ambulatory Visit: Payer: Self-pay

## 2017-01-18 ENCOUNTER — Encounter (HOSPITAL_COMMUNITY): Payer: Self-pay | Admitting: Emergency Medicine

## 2017-01-19 ENCOUNTER — Ambulatory Visit: Payer: Self-pay | Admitting: Internal Medicine

## 2017-01-25 ENCOUNTER — Telehealth: Payer: Self-pay | Admitting: Gastroenterology

## 2017-01-25 ENCOUNTER — Other Ambulatory Visit: Payer: Self-pay

## 2017-01-25 MED ORDER — NA SULFATE-K SULFATE-MG SULF 17.5-3.13-1.6 GM/177ML PO SOLN: 1.0000 | Freq: Once | ORAL | 0 refills | Status: AC

## 2017-01-25 NOTE — Telephone Encounter (Signed)
Rx changed to Suprep. Pt aware of $45 copay.

## 2017-01-26 ENCOUNTER — Ambulatory Visit (HOSPITAL_COMMUNITY): Payer: Medicare Other | Admitting: Certified Registered Nurse Anesthetist

## 2017-01-26 ENCOUNTER — Encounter: Payer: Self-pay | Admitting: Adult Health

## 2017-01-26 ENCOUNTER — Ambulatory Visit (HOSPITAL_COMMUNITY)
Admission: RE | Admit: 2017-01-26 | Discharge: 2017-01-26 | Disposition: A | Discharge status: Home / Self Care | Payer: Medicare Other | Source: Ambulatory Visit | Attending: Gastroenterology | Admitting: Gastroenterology

## 2017-01-26 ENCOUNTER — Encounter (HOSPITAL_COMMUNITY): Payer: Self-pay

## 2017-01-26 ENCOUNTER — Other Ambulatory Visit: Payer: Self-pay

## 2017-01-26 ENCOUNTER — Encounter (HOSPITAL_COMMUNITY)
Admission: RE | Disposition: A | Discharge status: Home / Self Care | Payer: Self-pay | Source: Ambulatory Visit | Attending: Gastroenterology

## 2017-01-26 DIAGNOSIS — K640 First degree hemorrhoids: Secondary | ICD-10-CM | POA: Diagnosis not present

## 2017-01-26 DIAGNOSIS — I272 Pulmonary hypertension, unspecified: Secondary | ICD-10-CM | POA: Insufficient documentation

## 2017-01-26 DIAGNOSIS — G4733 Obstructive sleep apnea (adult) (pediatric): Secondary | ICD-10-CM | POA: Diagnosis not present

## 2017-01-26 DIAGNOSIS — N183 Chronic kidney disease, stage 3 unspecified: Secondary | ICD-10-CM

## 2017-01-26 DIAGNOSIS — F419 Anxiety disorder, unspecified: Secondary | ICD-10-CM | POA: Insufficient documentation

## 2017-01-26 DIAGNOSIS — Z7901 Long term (current) use of anticoagulants: Secondary | ICD-10-CM | POA: Diagnosis not present

## 2017-01-26 DIAGNOSIS — I2510 Atherosclerotic heart disease of native coronary artery without angina pectoris: Secondary | ICD-10-CM | POA: Diagnosis not present

## 2017-01-26 DIAGNOSIS — K5730 Diverticulosis of large intestine without perforation or abscess without bleeding: Secondary | ICD-10-CM | POA: Diagnosis not present

## 2017-01-26 DIAGNOSIS — Z79899 Other long term (current) drug therapy: Secondary | ICD-10-CM | POA: Diagnosis not present

## 2017-01-26 DIAGNOSIS — F329 Major depressive disorder, single episode, unspecified: Secondary | ICD-10-CM | POA: Insufficient documentation

## 2017-01-26 DIAGNOSIS — I509 Heart failure, unspecified: Secondary | ICD-10-CM | POA: Diagnosis not present

## 2017-01-26 DIAGNOSIS — D631 Anemia in chronic kidney disease: Secondary | ICD-10-CM | POA: Insufficient documentation

## 2017-01-26 DIAGNOSIS — Z7951 Long term (current) use of inhaled steroids: Secondary | ICD-10-CM | POA: Diagnosis not present

## 2017-01-26 DIAGNOSIS — G4730 Sleep apnea, unspecified: Secondary | ICD-10-CM | POA: Insufficient documentation

## 2017-01-26 DIAGNOSIS — K31811 Angiodysplasia of stomach and duodenum with bleeding: Secondary | ICD-10-CM

## 2017-01-26 DIAGNOSIS — E1122 Type 2 diabetes mellitus with diabetic chronic kidney disease: Secondary | ICD-10-CM | POA: Insufficient documentation

## 2017-01-26 DIAGNOSIS — Z87891 Personal history of nicotine dependence: Secondary | ICD-10-CM | POA: Insufficient documentation

## 2017-01-26 DIAGNOSIS — Z9989 Dependence on other enabling machines and devices: Secondary | ICD-10-CM

## 2017-01-26 DIAGNOSIS — I130 Hypertensive heart and chronic kidney disease with heart failure and stage 1 through stage 4 chronic kidney disease, or unspecified chronic kidney disease: Secondary | ICD-10-CM | POA: Insufficient documentation

## 2017-01-26 DIAGNOSIS — K219 Gastro-esophageal reflux disease without esophagitis: Secondary | ICD-10-CM | POA: Diagnosis not present

## 2017-01-26 DIAGNOSIS — D509 Iron deficiency anemia, unspecified: Secondary | ICD-10-CM | POA: Diagnosis not present

## 2017-01-26 DIAGNOSIS — Z7984 Long term (current) use of oral hypoglycemic drugs: Secondary | ICD-10-CM | POA: Insufficient documentation

## 2017-01-26 DIAGNOSIS — K31819 Angiodysplasia of stomach and duodenum without bleeding: Secondary | ICD-10-CM

## 2017-01-26 DIAGNOSIS — K6289 Other specified diseases of anus and rectum: Secondary | ICD-10-CM

## 2017-01-26 DIAGNOSIS — I480 Paroxysmal atrial fibrillation: Secondary | ICD-10-CM | POA: Insufficient documentation

## 2017-01-26 DIAGNOSIS — D121 Benign neoplasm of appendix: Secondary | ICD-10-CM | POA: Insufficient documentation

## 2017-01-26 DIAGNOSIS — Z951 Presence of aortocoronary bypass graft: Secondary | ICD-10-CM | POA: Diagnosis not present

## 2017-01-26 HISTORY — PX: ESOPHAGOGASTRODUODENOSCOPY (EGD) WITH PROPOFOL: SHX5813

## 2017-01-26 HISTORY — PX: COLONOSCOPY WITH PROPOFOL: SHX5780

## 2017-01-26 LAB — GLUCOSE, CAPILLARY: GLUCOSE-CAPILLARY: 233 mg/dL — AB (ref 65–99)

## 2017-01-26 SURGERY — ESOPHAGOGASTRODUODENOSCOPY (EGD) WITH PROPOFOL
Anesthesia: Monitor Anesthesia Care

## 2017-01-26 MED ORDER — SODIUM CHLORIDE 0.9 % IV SOLN
INTRAVENOUS | Status: DC
  Administered 2017-01-26: 1000 mL via INTRAVENOUS

## 2017-01-26 MED ORDER — PROPOFOL 10 MG/ML IV BOLUS
INTRAVENOUS | Status: DC | PRN
  Administered 2017-01-26 (×3): 20 mg via INTRAVENOUS

## 2017-01-26 MED ORDER — ONDANSETRON HCL 4 MG/2ML IJ SOLN
INTRAMUSCULAR | Status: DC | PRN
  Administered 2017-01-26: 4 mg via INTRAVENOUS

## 2017-01-26 MED ORDER — LIDOCAINE 2% (20 MG/ML) 5 ML SYRINGE
INTRAMUSCULAR | Status: DC | PRN
  Administered 2017-01-26: 80 mg via INTRAVENOUS

## 2017-01-26 MED ORDER — PROPOFOL 10 MG/ML IV BOLUS
INTRAVENOUS | Status: AC
Start: 2017-01-26 — End: 2017-01-26
  Filled 2017-01-26: qty 20

## 2017-01-26 MED ORDER — SODIUM CHLORIDE 0.9 % IJ SOLN
INTRAMUSCULAR | Status: DC | PRN
  Administered 2017-01-26: 2 mL

## 2017-01-26 MED ORDER — PROPOFOL 10 MG/ML IV BOLUS
INTRAVENOUS | Status: AC
  Filled 2017-01-26: qty 40

## 2017-01-26 MED ORDER — PROPOFOL 10 MG/ML IV BOLUS
INTRAVENOUS | Status: AC
  Filled 2017-01-26: qty 20

## 2017-01-26 MED ORDER — PHENYLEPHRINE HCL 10 MG/ML IJ SOLN
INTRAMUSCULAR | Status: DC | PRN
  Administered 2017-01-26 (×2): 80 ug via INTRAVENOUS

## 2017-01-26 MED ORDER — PROPOFOL 500 MG/50ML IV EMUL
INTRAVENOUS | Status: DC | PRN
  Administered 2017-01-26: 150 ug/kg/min via INTRAVENOUS

## 2017-01-26 MED ORDER — EPINEPHRINE PF 1 MG/10ML IJ SOSY
PREFILLED_SYRINGE | INTRAMUSCULAR | Status: AC
  Filled 2017-01-26: qty 10

## 2017-01-26 SURGICAL SUPPLY — 25 items

## 2017-01-26 NOTE — Anesthesia Postprocedure Evaluation (Signed)
Anesthesia Post Note  Patient: JEREL SARDINA  Procedure(s) Performed: ESOPHAGOGASTRODUODENOSCOPY (EGD) WITH PROPOFOL (N/A ) COLONOSCOPY WITH PROPOFOL (N/A )     Patient location during evaluation: PACU Anesthesia Type: MAC Level of consciousness: awake and alert Pain management: pain level controlled Vital Signs Assessment: post-procedure vital signs reviewed and stable Respiratory status: spontaneous breathing, nonlabored ventilation and respiratory function stable Cardiovascular status: stable and blood pressure returned to baseline Anesthetic complications: no    Last Vitals:  Vitals:   01/26/17 1229 01/26/17 1230  BP:  129/63  Pulse: 68 68  Resp: 18 20  Temp:    SpO2: 96% 95%    Last Pain:  Vitals:   01/26/17 1158  TempSrc: Oral                 Audry Pili

## 2017-01-26 NOTE — Op Note (Signed)
[facility] Patient Name: [name] Procedure Date: 01/26/2017 MRN: [id number] Attending MD: Estill Cotta. [name] , MD Date of Birth: [birth year] CSN: [id number] Age: 77 Admit Type: Outpatient Procedure:                Colonoscopy Indications:              Iron deficiency anemia (heme negative, chronic                            kidney disease) Providers:                Estill Cotta. [name], MD, Cleda Daub, RN, Nevin Bloodgood, Technician, Christell Faith, CRNA Referring MD:             Liane Comber, NP Medicines:                Monitored Anesthesia Care Complications:            No immediate complications. Estimated Blood Loss:     Estimated blood loss was minimal. Procedure:                Pre-Anesthesia Assessment:                           - Prior to the procedure, a History and Physical                            was performed, and patient medications and                            allergies were reviewed. The patient's tolerance of                            previous anesthesia was also reviewed. The risks                            and benefits of the procedure and the sedation                            options and risks were discussed with the patient.                            All questions were answered, and informed consent                            was obtained. Prior Anticoagulants: The patient has                            taken Coumadin (warfarin), last dose was 4 days                            prior to procedure. ASA Grade Assessment: III - A  patient with severe systemic disease. After                            reviewing the risks and benefits, the patient was                            deemed in satisfactory condition to undergo the                            procedure.                           After obtaining informed consent, the colonoscope                            was passed under direct vision.  Throughout the                            procedure, the patient's blood pressure, pulse, and                            oxygen saturations were monitored continuously. The                            Colonoscope was introduced through the anus and                            advanced to the the cecum, identified by                            appendiceal orifice and ileocecal valve. The                            colonoscopy was performed without difficulty. The                            patient tolerated the procedure well. The quality                            of the bowel preparation was good. The ileocecal                            valve, appendiceal orifice, and rectum were                            photographed. The quality of the bowel preparation                            was evaluated using the BBPS [facility] Bowel                            Preparation Scale) with scores of: Right Colon = 2,  Transverse Colon = 2 and Left Colon = 2. The total                            BBPS score equals 6. Scope In: 11:26:21 AM Scope Out: 22:97:98 AM Scope Withdrawal Time: 0 hours 14 minutes 28 seconds  Total Procedure Duration: 0 hours 19 minutes 43 seconds  Findings:      The digital rectal exam findings include decreased sphincter tone.      Diverticula were found in the left colon.      A polyp at least 3m in size (incompletely visualized due to location)       was found in the appendiceal orifice. The polyp was sessile. Biopsies       were taken with a cold forceps for histology.      Internal hemorrhoids were found. The hemorrhoids were Grade I (internal       hemorrhoids that do not prolapse).      The exam was otherwise without abnormality on direct and retroflexion       views. Impression:               - Decreased sphincter tone found on digital rectal                            exam.                           - Diverticulosis in the left colon.                            - One 15 mm polyp at the appendiceal orifice.                            Biopsied.                           - Internal hemorrhoids.                           - The examination was otherwise normal on direct                            and retroflexion views. Moderate Sedation:      MAC sedation used Recommendation:           - Patient has a contact number available for                            emergencies. The signs and symptoms of potential                            delayed complications were discussed with the                            patient. Return to normal activities tomorrow.                            Written discharge instructions were provided to the  patient.                           - Resume previous diet.                           - Resume Coumadin (warfarin) at prior dose tomorrow.                           - Await pathology results.                           - Repeat colonoscopy is recommended for                            surveillance. The colonoscopy date will be                            determined after pathology results from today's                            exam become available for review.                           - Probable surgical referral depending upon                            pathology result. Procedure Code(s):        --- Professional ---                           [phone], Colonoscopy, flexible; with biopsy, single                            or multiple Diagnosis Code(s):        --- Professional ---                           K62.89, Other specified diseases of anus and rectum                           K64.0, First degree hemorrhoids                           D12.1, Benign neoplasm of appendix                           D50.9, Iron deficiency anemia, unspecified                           K57.30, Diverticulosis of large intestine without                            perforation or abscess without  bleeding CPT copyright 2016 [employer]. All rights reserved. The codes documented in this report are preliminary and upon coder review may  be revised to meet current compliance requirements. Liberty L. [name], MD 01/26/2017 11:58:56 AM This  report has been signed electronically. Number of Addenda: 0

## 2017-01-26 NOTE — Anesthesia Procedure Notes (Signed)
Procedure Name: MAC Date/Time: 01/26/2017 10:56 AM Performed by: West Pugh, CRNA Pre-anesthesia Checklist: Patient identified, Emergency Drugs available, Suction available, Patient being monitored and Timeout performed Patient Re-evaluated:Patient Re-evaluated prior to induction Oxygen Delivery Method: Nasal cannula Placement Confirmation: positive ETCO2 and CO2 detector Dental Injury: Teeth and Oropharynx as per pre-operative assessment

## 2017-01-26 NOTE — Interval H&P Note (Signed)
History and Physical Interval Note:  01/26/2017 10:15 AM  [name]  has presented today for surgery, with the diagnosis of iron def anemia  The various methods of treatment have been discussed with the patient and family. After consideration of risks, benefits and other options for treatment, the patient has consented to  Procedure(s): ESOPHAGOGASTRODUODENOSCOPY (EGD) WITH PROPOFOL (N/A) COLONOSCOPY WITH PROPOFOL (N/A) as a surgical intervention .  The patient's history has been reviewed, patient examined, no change in status, stable for surgery.  I have reviewed the patient's chart and labs.  Questions were answered to the patient's satisfaction.     Nelida Meuse III

## 2017-01-26 NOTE — Op Note (Signed)
[facility] Patient Name: [name] Procedure Date: 01/26/2017 MRN: [id number] Attending MD: Estill Cotta. [name] , MD Date of Birth: June 08, 1940 CSN: [id number] Age: 76 Admit Type: Outpatient Procedure:                Upper GI endoscopy Indications:              Iron deficiency anemia Providers:                [name] L. [name], MD, Cleda Daub, RN, Nevin Bloodgood, Technician, Christell Faith, CRNA Referring MD:             Liane Comber, NP Medicines:                Monitored Anesthesia Care Complications:            No immediate complications. Estimated Blood Loss:     Estimated blood loss was minimal. Procedure:                Pre-Anesthesia Assessment:                           - Prior to the procedure, a History and Physical                            was performed, and patient medications and                            allergies were reviewed. The patient's tolerance of                            previous anesthesia was also reviewed. The risks                            and benefits of the procedure and the sedation                            options and risks were discussed with the patient.                            All questions were answered, and informed consent                            was obtained. Prior Anticoagulants: The patient has                            taken Coumadin (warfarin), last dose was 4 days                            prior to procedure. ASA Grade Assessment: III - A                            patient with severe systemic disease. After  reviewing the risks and benefits, the patient was                            deemed in satisfactory condition to undergo the                            procedure.                           After obtaining informed consent, the endoscope was                            passed under direct vision. Throughout the                            procedure, the  patient's blood pressure, pulse, and                            oxygen saturations were monitored continuously. The                            [id number] ([id number]) scope was introduced through the                            mouth, and advanced to the second part of duodenum.                            The upper GI endoscopy was accomplished without                            difficulty. The patient tolerated the procedure                            well. Scope In: Scope Out: Findings:      The esophagus was normal.      A single 4 mm bleeding angioectasia was found on the greater curvature       of the gastric body. Area was successfully injected with 2 mL of a       1:10,000 solution of epinephrine for hemostasis. Coagulation for       hemostasis using argon plasma at 1 liter/minute and 40 watts was       successful. To prevent bleeding post-intervention, one hemostatic clip       was successfully placed (MR conditional). There was no bleeding at the       end of the procedure.      The exam of the stomach was otherwise normal.      The cardia and gastric fundus were normal on retroflexion.      The examined duodenum was normal. Impression:               - Normal esophagus.                           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  Injected. Treated with argon plasma coagulation                            (APC). Clip (MR conditional) was placed.                           - Normal examined duodenum.                           - No specimens collected. Moderate Sedation:      N/A- Per Anesthesia Care Recommendation:           - Patient has a contact number available for                            emergencies. The signs and symptoms of potential                            delayed complications were discussed with the                            patient. Return to normal activities tomorrow.                            Written discharge instructions  were provided to the                            patient.                           - Resume previous diet.                           - Resume Coumadin (warfarin) at prior dose tomorrow.                           - See the other procedure note for documentation of                            additional recommendations. Procedure Code(s):        --- Professional ---                           [phone], Esophagogastroduodenoscopy, flexible,                            transoral; with control of bleeding, any method Diagnosis Code(s):        --- Professional ---                           V89.381, Angiodysplasia of stomach and duodenum                            with bleeding                           D50.9, Iron deficiency anemia, unspecified CPT  copyright 2016 American Medical Association. All rights reserved. The codes documented in this report are preliminary and upon coder review may  be revised to meet current compliance requirements. Rasaan L. [name], MD 01/26/2017 11:53:24 AM This report has been signed electronically. Number of Addenda: 0

## 2017-01-26 NOTE — Discharge Instructions (Signed)
YOU HAD AN ENDOSCOPIC PROCEDURE TODAY: Refer to the procedure report and other information in the discharge instructions given to you for any specific questions about what was found during the examination. If this information does not answer your questions, please call [place] office at [phone] to clarify.  ° °YOU SHOULD EXPECT: Some feelings of bloating in the abdomen. Passage of more gas than usual. Walking can help get rid of the air that was put into your GI tract during the procedure and reduce the bloating. If you had a lower endoscopy (such as a colonoscopy or flexible sigmoidoscopy) you may notice spotting of blood in your stool or on the toilet paper. Some abdominal soreness may be present for a day or two, also. ° °DIET: Your first meal following the procedure should be a light meal and then it is ok to progress to your normal diet. A half-sandwich or bowl of soup is an example of a good first meal. Heavy or fried foods are harder to digest and may make you feel nauseous or bloated. Drink plenty of fluids but you should avoid alcoholic beverages for 24 hours. If you had a esophageal dilation, please see attached instructions for diet.   ° °ACTIVITY: Your care partner should take you home directly after the procedure. You should plan to take it easy, moving slowly for the rest of the day. You can resume normal activity the day after the procedure however YOU SHOULD NOT DRIVE, use power tools, machinery or perform tasks that involve climbing or major physical exertion for 24 hours (because of the sedation medicines used during the test).  ° °SYMPTOMS TO REPORT IMMEDIATELY: °A gastroenterologist can be reached at any hour. Please call [phone]  for any of the following symptoms:  °Following lower endoscopy (colonoscopy, flexible sigmoidoscopy) °Excessive amounts of blood in the stool  °Significant tenderness, worsening of abdominal pains  °Swelling of the abdomen that is new, acute  °Fever of 100° or  higher  °Following upper endoscopy (EGD, EUS, ERCP, esophageal dilation) °Vomiting of blood or coffee ground material  °New, significant abdominal pain  °New, significant chest pain or pain under the shoulder blades  °Painful or persistently difficult swallowing  °New shortness of breath  °Black, tarry-looking or red, bloody stools ° °FOLLOW UP:  °If any biopsies were taken you will be contacted by phone or by letter within the next 1-3 weeks. Call [phone]  if you have not heard about the biopsies in 3 weeks.  °Please also call with any specific questions about appointments or follow up tests. ° °

## 2017-01-26 NOTE — Transfer of Care (Signed)
Immediate Anesthesia Transfer of Care Note  Patient: [name]  Procedure(s) Performed: ESOPHAGOGASTRODUODENOSCOPY (EGD) WITH PROPOFOL (N/A ) COLONOSCOPY WITH PROPOFOL (N/A )  Patient Location: PACU  Anesthesia Type:MAC  Level of Consciousness: awake, alert  and patient cooperative  Airway & Oxygen Therapy: Patient Spontanous Breathing and Patient connected to nasal cannula oxygen  Post-op Assessment: Report given to RN, Post -op Vital signs reviewed and stable and Patient moving all extremities X 4  Post vital signs: Reviewed and stable  Last Vitals:  Vitals:   01/26/17 1011  BP: (!) 101/38  Pulse: 72  Resp: 15  Temp: 37.1 C  SpO2: 95%    Last Pain:  Vitals:   01/26/17 1011  TempSrc: Oral         Complications: No apparent anesthesia complications

## 2017-01-26 NOTE — Anesthesia Preprocedure Evaluation (Addendum)
Anesthesia Evaluation  Patient identified by MRN, date of birth, ID band Patient awake    Reviewed: Allergy & Precautions, NPO status , Patient's Chart, lab work & pertinent test results, reviewed documented beta blocker date and time   Airway Mallampati: II   Neck ROM: Full    Dental  (+) Dental Advisory Given   Pulmonary sleep apnea and Continuous Positive Airway Pressure Ventilation , former smoker,  Restrictive lung dz per PFTs   breath sounds clear to auscultation       Cardiovascular hypertension, Pt. on medications and Pt. on home beta blockers + CAD, + CABG and +CHF  + dysrhythmias Atrial Fibrillation  Rhythm:Irregular Rate:Normal  S/p MAZE procedure  TTE 12/2016 - Moderate concentric hypertrophy. EF was in the range of 60% to 65%. Ascending aorta 4.1 cm. Mild MR. LA was severely dilated. RV was mildly dilated. RA was severely dilated. Mild TR. PASP was moderately increased. PA peak pressure: 54 mmHg.  Per Dr. Clayborne Dana note - In 3/16 began to develop severe SOB. Started O2. Symptoms got worse almost overnight. 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 He has seen Dr. Roxy Manns but felt to have no optimal surgical options for repair. PFTs that showed significant restrictive lung disease with a low DLCO.     Neuro/Psych Anxiety Depression    GI/Hepatic hiatal hernia, GERD  Medicated,  Endo/Other  diabetes, Type 2, Oral Hypoglycemic Agents  Renal/GU CRFRenal disease     Musculoskeletal  (+) Arthritis ,   Abdominal   Peds  Hematology   Anesthesia Other Findings   Reproductive/Obstetrics                            Anesthesia Physical  Anesthesia Plan  ASA: III  Anesthesia Plan: MAC   Post-op Pain Management:  Regional for Post-op pain   Induction: Intravenous  PONV Risk Score and Plan:  Propofol infusion and Treatment may vary due to age or medical condition  Airway Management Planned: Nasal Cannula  Additional Equipment: None  Intra-op Plan:   Post-operative Plan:   Informed Consent: I have reviewed the patients History and Physical, chart, labs and discussed the procedure including the risks, benefits and alternatives for the proposed anesthesia with the patient or authorized representative who has indicated his/her understanding and acceptance.   Dental advisory given  Plan Discussed with: CRNA  Anesthesia Plan Comments:        Anesthesia Quick Evaluation

## 2017-01-28 ENCOUNTER — Telehealth: Payer: Self-pay

## 2017-01-28 ENCOUNTER — Encounter (HOSPITAL_COMMUNITY): Payer: Self-pay | Admitting: Gastroenterology

## 2017-01-28 NOTE — Telephone Encounter (Signed)
Just received notification today that patient is scheduled to see Dr. Hulen Skains tomorrow, 01/29/17 at 3:20 for [facility] consult appointment.

## 2017-01-28 NOTE — Telephone Encounter (Signed)
Faxed surgical referral to [facility] on 01/27/17.

## 2017-01-29 ENCOUNTER — Ambulatory Visit: Payer: Self-pay | Admitting: General Surgery

## 2017-01-29 DIAGNOSIS — D121 Benign neoplasm of appendix: Secondary | ICD-10-CM | POA: Diagnosis not present

## 2017-02-04 ENCOUNTER — Encounter (INDEPENDENT_AMBULATORY_CARE_PROVIDER_SITE_OTHER): Payer: Medicare Other | Admitting: Ophthalmology

## 2017-02-04 DIAGNOSIS — H43813 Vitreous degeneration, bilateral: Secondary | ICD-10-CM

## 2017-02-04 DIAGNOSIS — I10 Essential (primary) hypertension: Secondary | ICD-10-CM

## 2017-02-04 DIAGNOSIS — H353132 Nonexudative age-related macular degeneration, bilateral, intermediate dry stage: Secondary | ICD-10-CM | POA: Diagnosis not present

## 2017-02-04 DIAGNOSIS — E113291 Type 2 diabetes mellitus with mild nonproliferative diabetic retinopathy without macular edema, right eye: Secondary | ICD-10-CM

## 2017-02-04 DIAGNOSIS — H348322 Tributary (branch) retinal vein occlusion, left eye, stable: Secondary | ICD-10-CM | POA: Diagnosis not present

## 2017-02-04 DIAGNOSIS — E11319 Type 2 diabetes mellitus with unspecified diabetic retinopathy without macular edema: Secondary | ICD-10-CM

## 2017-02-04 DIAGNOSIS — E113392 Type 2 diabetes mellitus with moderate nonproliferative diabetic retinopathy without macular edema, left eye: Secondary | ICD-10-CM

## 2017-02-04 DIAGNOSIS — H35033 Hypertensive retinopathy, bilateral: Secondary | ICD-10-CM

## 2017-02-04 DIAGNOSIS — H338 Other retinal detachments: Secondary | ICD-10-CM

## 2017-02-04 LAB — HM DIABETES EYE EXAM

## 2017-02-10 ENCOUNTER — Other Ambulatory Visit: Payer: Self-pay | Admitting: Internal Medicine

## 2017-02-11 ENCOUNTER — Telehealth (HOSPITAL_COMMUNITY): Payer: Self-pay | Admitting: *Deleted

## 2017-02-11 NOTE — Telephone Encounter (Signed)
Received surgical clearance from [facility] for patient to have an appendectomy.  Per [name], "patient is a moderate to high risk for peri-op complications with general anesthesia.  Would proceed only if absolutely medically necessary."    Clearance faxed today to [phone].

## 2017-02-15 ENCOUNTER — Other Ambulatory Visit: Payer: Self-pay | Admitting: Adult Health

## 2017-02-16 ENCOUNTER — Ambulatory Visit: Payer: Medicare Other | Admitting: Gastroenterology

## 2017-02-17 ENCOUNTER — Encounter: Payer: Self-pay | Admitting: *Deleted

## 2017-02-26 NOTE — Pre-Procedure Instructions (Signed)
[name]  02/26/2017      [id number] [address], [place] - [place] [address] Phone: [phone] Fax: [phone]  [place], [address] [place] [address] Phone: [phone] Fax: [phone]    Your procedure is scheduled on March 05, 2017.  Report to [facility] Admitting at 815 AM.  Call this number if you have problems the morning of surgery:  [phone]   Remember:  Do not eat food or drink liquids after midnight.  Take these medicines the morning of surgery with A SIP OF WATER carvedilol (coreg), acetaminophen (tylenol)-if needed, allopurinol (zyloprim), nasal spray-if needed, colchicine-if needed, hydrocodone-acetaminophen (norco)-if needed for pain, loratadine (claritin)-if needed,  Eye drops, ranitidine (zantac), sertraline (zoloft).  Stop/resume coumadin as instructed by your surgeon.   Beginning now, STOP taking any Aspirin (unless otherwise instructed by your surgeon), Aleve, Naproxen, Ibuprofen, Motrin, Advil, Goody's, BC's, all herbal medications, fish oil, and all vitamins  Continue all other medications as instructed by your physician except follow the above medication instructions before surgery  WHAT DO I DO ABOUT MY DIABETES MEDICATION?  [name] Do not take oral diabetes medicines (pills) the morning of surgery: metformin (glucophage) or glipizide (glucotrol).  . The day of surgery, do not take other diabetes injectables, including Byetta (exenatide), Bydureon (exenatide ER), Victoza (liraglutide), or Trulicity (dulaglutide).   Reviewed and Endorsed by [facility] Patient Education Committee, August 2015   How to Manage Your Diabetes Before and After Surgery  Why is it important to control my blood sugar before and after surgery? . Improving blood sugar levels before and after surgery helps healing and can limit  problems. . A way of improving blood sugar control is eating a healthy diet by: o  Eating less sugar and carbohydrates o  Increasing activity/exercise o  Talking with your doctor about reaching your blood sugar goals . High blood sugars (greater than 180 mg/dL) can raise your risk of infections and slow your recovery, so you will need to focus on controlling your diabetes during the weeks before surgery. . Make sure that the doctor who takes care of your diabetes knows about your planned surgery including the date and location.  How do I manage my blood sugar before surgery? . Check your blood sugar at least 4 times a day, starting 2 days before surgery, to make sure that the level is not too high or low. o Check your blood sugar the morning of your surgery when you wake up and every 2 hours until you get to the Short Stay unit. . If your blood sugar is less than 70 mg/dL, you will need to treat for low blood sugar: o Do not take insulin. o Treat a low blood sugar (less than 70 mg/dL) with  cup of clear juice (cranberry or apple), 4 glucose tablets, OR glucose gel. Recheck blood sugar in 15 minutes after treatment (to make sure it is greater than 70 mg/dL). If your blood sugar is not greater than 70 mg/dL on recheck, call [phone] o  for further instructions. . Report your blood sugar to the short stay nurse when you get to Short Stay.  . If you are admitted to the hospital after surgery: o Your blood sugar will be checked by the staff and you will probably be given insulin after surgery (instead of oral diabetes medicines) to make  sure you have good blood sugar levels. o The goal for blood sugar control after surgery is 80-180 mg/dL.   Do not wear jewelry, make-up or nail polish.  Do not wear lotions, powders, or perfumes, or deodorant.  Do not shave 48 hours prior to surgery.  Men may shave face and neck.  Do not bring valuables to the hospital.  [facility] is not responsible for  any belongings or valuables.  Contacts, dentures or bridgework may not be worn into surgery.  Leave your suitcase in the car.  After surgery it may be brought to your room.  For patients admitted to the hospital, discharge time will be determined by your treatment team.  Patients discharged the day of surgery will not be allowed to drive home.   Special instructions:  [name]- Preparing For Surgery  Before surgery, you can play an important role. Because skin is not sterile, your skin needs to be as free of germs as possible. You can reduce the number of germs on your skin by washing with CHG (chlorahexidine gluconate) Soap before surgery.  CHG is an antiseptic cleaner which kills germs and bonds with the skin to continue killing germs even after washing.  Please do not use if you have an allergy to CHG or antibacterial soaps. If your skin becomes reddened/irritated stop using the CHG.  Do not shave (including legs and underarms) for at least 48 hours prior to first CHG shower. It is OK to shave your face.  Please follow these instructions carefully.   1. Shower the NIGHT BEFORE SURGERY and the MORNING OF SURGERY with CHG.   2. If you chose to wash your hair, wash your hair first as usual with your normal shampoo.  3. After you shampoo, rinse your hair and body thoroughly to remove the shampoo.  4. Use CHG as you would any other liquid soap. You can apply CHG directly to the skin and wash gently with a scrungie or a clean washcloth.   5. Apply the CHG Soap to your body ONLY FROM THE NECK DOWN.  Do not use on open wounds or open sores. Avoid contact with your eyes, ears, mouth and genitals (private parts). Wash Face and genitals (private parts)  with your normal soap.  6. Wash thoroughly, paying special attention to the area where your surgery will be performed.  7. Thoroughly rinse your body with warm water from the neck down.  8. DO NOT shower/wash with your normal soap after  using and rinsing off the CHG Soap.  9. Pat yourself dry with a CLEAN TOWEL.  10. Wear CLEAN PAJAMAS to bed the night before surgery, wear comfortable clothes the morning of surgery  11. Place CLEAN SHEETS on your bed the night of your first shower and DO NOT SLEEP WITH PETS.  Day of Surgery: Do not apply any deodorants/lotions. Please wear clean clothes to the hospital/surgery center.    Please read over the following fact sheets that you were given. Pain Booklet, Coughing and Deep Breathing and Surgical Site Infection Prevention

## 2017-03-01 ENCOUNTER — Encounter (HOSPITAL_COMMUNITY)
Admission: RE | Admit: 2017-03-01 | Discharge: 2017-03-01 | Disposition: A | Discharge status: Home / Self Care | Payer: Medicare Other | Source: Ambulatory Visit | Attending: General Surgery | Admitting: General Surgery

## 2017-03-01 ENCOUNTER — Ambulatory Visit (INDEPENDENT_AMBULATORY_CARE_PROVIDER_SITE_OTHER): Payer: Medicare Other

## 2017-03-01 ENCOUNTER — Encounter (HOSPITAL_COMMUNITY): Payer: Self-pay

## 2017-03-01 DIAGNOSIS — Z87891 Personal history of nicotine dependence: Secondary | ICD-10-CM | POA: Diagnosis not present

## 2017-03-01 DIAGNOSIS — I10 Essential (primary) hypertension: Secondary | ICD-10-CM | POA: Insufficient documentation

## 2017-03-01 DIAGNOSIS — I739 Peripheral vascular disease, unspecified: Secondary | ICD-10-CM | POA: Insufficient documentation

## 2017-03-01 DIAGNOSIS — I2510 Atherosclerotic heart disease of native coronary artery without angina pectoris: Secondary | ICD-10-CM | POA: Insufficient documentation

## 2017-03-01 DIAGNOSIS — Z79899 Other long term (current) drug therapy: Secondary | ICD-10-CM

## 2017-03-01 DIAGNOSIS — K219 Gastro-esophageal reflux disease without esophagitis: Secondary | ICD-10-CM | POA: Diagnosis not present

## 2017-03-01 DIAGNOSIS — Z951 Presence of aortocoronary bypass graft: Secondary | ICD-10-CM | POA: Insufficient documentation

## 2017-03-01 DIAGNOSIS — Z01812 Encounter for preprocedural laboratory examination: Secondary | ICD-10-CM | POA: Insufficient documentation

## 2017-03-01 DIAGNOSIS — E119 Type 2 diabetes mellitus without complications: Secondary | ICD-10-CM | POA: Insufficient documentation

## 2017-03-01 DIAGNOSIS — I509 Heart failure, unspecified: Secondary | ICD-10-CM | POA: Diagnosis not present

## 2017-03-01 DIAGNOSIS — Z7901 Long term (current) use of anticoagulants: Secondary | ICD-10-CM | POA: Diagnosis not present

## 2017-03-01 HISTORY — DX: Gout, unspecified: M10.9

## 2017-03-01 LAB — CBC WITH DIFFERENTIAL/PLATELET
Basophils Absolute: 0 10*3/uL (ref 0.0–0.1)
Basophils Relative: 0 %
Eosinophils Absolute: 0.1 10*3/uL (ref 0.0–0.7)
Eosinophils Relative: 1 %
HEMATOCRIT: 40 % (ref 39.0–52.0)
Hemoglobin: 12.6 g/dL — ABNORMAL LOW (ref 13.0–17.0)
LYMPHS PCT: 9 %
Lymphs Abs: 0.8 10*3/uL (ref 0.7–4.0)
MCH: 29.7 pg (ref 26.0–34.0)
MCHC: 31.5 g/dL (ref 30.0–36.0)
MCV: 94.3 fL (ref 78.0–100.0)
MONO ABS: 0.3 10*3/uL (ref 0.1–1.0)
MONOS PCT: 4 %
NEUTROS ABS: 7 10*3/uL (ref 1.7–7.7)
Neutrophils Relative %: 86 %
Platelets: 171 10*3/uL (ref 150–400)
RBC: 4.24 MIL/uL (ref 4.22–5.81)
RDW: 17.5 % — AB (ref 11.5–15.5)
WBC: 8.2 10*3/uL (ref 4.0–10.5)

## 2017-03-01 LAB — TYPE AND SCREEN
ABO/RH(D): O POS
Antibody Screen: NEGATIVE

## 2017-03-01 LAB — BASIC METABOLIC PANEL
ANION GAP: 13 (ref 5–15)
BUN: 25 mg/dL — ABNORMAL HIGH (ref 6–20)
CALCIUM: 9.3 mg/dL (ref 8.9–10.3)
CO2: 26 mmol/L (ref 22–32)
Chloride: 100 mmol/L — ABNORMAL LOW (ref 101–111)
Creatinine, Ser: 1.58 mg/dL — ABNORMAL HIGH (ref 0.61–1.24)
GFR calc Af Amer: 47 mL/min — ABNORMAL LOW (ref >60)
GFR calc non Af Amer: 41 mL/min — ABNORMAL LOW (ref >60)
GLUCOSE: 332 mg/dL — AB (ref 65–99)
Potassium: 3.7 mmol/L (ref 3.5–5.1)
Sodium: 139 mmol/L (ref 135–145)

## 2017-03-01 LAB — GLUCOSE, CAPILLARY: Glucose-Capillary: 300 mg/dL — ABNORMAL HIGH (ref 65–99)

## 2017-03-01 NOTE — Progress Notes (Signed)
Patient presents to the office for a nurse visit for labs to check his PT-INR. Patient is having an appendectomy on 03/05/17.

## 2017-03-01 NOTE — Progress Notes (Signed)
CBG 300 this morning,pt states he took his medication this AM,but has Breakfast @ 8:00 AM.  Last recorded A1C was 9.8  12/2016  Pt. States that he only checks his sugar once a day in the morning.

## 2017-03-01 NOTE — Progress Notes (Addendum)
Anesthesia PAT Evaluation: Patient is a 77 year old male scheduled for laparoscopic appendectomy on 03/05/17 by Dr. Judeth Horn. He has a 15 mm polyp in the appendiceal orifice. Although biopsies during colonoscopy showed no cancer, GI recommended that the polyp still be removed to be certain no cancer in other portions of the polyp or develop in the cancer over time. GI referred patient for appendectomy.  History includes CAD (s/p CABG LIMA-LAD, SVG-Ramus INT 04/05/13), afib/flutter (DCCV x3 '06-'15; s/p MAZE 04/05/13; s/p ablation 10/26/13), hypertensive cardiomyopathy, chronic diastolic CHF, PFO (left to right shunting 06/05/14 TEE), left pleural effusion (s/p thoracentesis 05/10/13), CKD, DM2, HTN, HLD, OSA (CPAP), partial anomalous pulmonary venous return (right superior pulmonary vein drains into SVC; 11/08/14 [name], "There does not appear to be any reasonable fix for his rerouting his anomalous PV and thus I suspect the best way to proceed is to attempt to optimize his volume status and any other factors we can fix."), pulmonary hypertension with right heart failure 11/2014 (required diuresis, paracentesis 11/09/14; off supplement O2 by 12/2014) adrenal adenoma, GERD, depression, anxiety, anemia, arthritis, gout, former smoker, skin cancer excision (BCC), LLE cellulitis 12/2015.  - PCP is Dr. [name].  - HF cardiologist is Dr. Glori Bickers. Last visit 12/09/16. His primary cardiologist was Dr. Romeo Apple then Dr. Mertie Moores, but he is now primarily seen by [name]. Per 02/11/17 telephone encounter by Donnamae Jude, RN, "Per [name], "patient is a moderate to high risk for peri-op complications with general anesthesia.  Would proceed only if absolutely medically necessary."   - GI is Dr. Wilfrid Lund. S/P EGD (normal esophagus and examined duodenum, single bleeding angioectasia in the stomach s/p injection/APC/clip) and colonoscopy (decreased sphincter tone, left colon  diverticula, 15 mm polyp in the appendiceal orifice, internal hemorrhoids) 01/26/17. Appendectomy recommended due to polyp. - He has seen pulmonologists Dr. Christinia Gully and Dr. Simonne Maffucci in the past, last in 2016. Referral to Dr. Rich Reining to [facility]Pulmonary Hypertension clinic recommended at that time given the "complexity of his problem with pulmonary hypertension and anomalous venous return." He was seen by Dr. Gilles Chiquito on 09/06/14 (see detailed note in Care Everywhere); however, Dr. Sung Amabile took over his care following his communication with Dr. Gilles Chiquito.  Meds include allopurinol, Xanax, ASA 81 mg (he will review instructions given to him by Dr. Hulen Skains on papers at home), Lipitor, Astelin nasal spray PRN, Coreg, colchicine, Benadryl, ferrous sulfate, Flonase, glipizide, Norco, Lidoderm, Claritin, metformin, Zaroxolyn PRN edema/weight gain, KCl, Zantac, Zoloft, torsemide daily and PRN edema/weight gain, valsartan, warfarin (last dose 02/28/17 AM.  BP 132/71   Pulse 83   Temp 36.7 C   Resp 20   Ht 5\' 10"  (1.778 m)   Wt 189 lb 12.8 oz (86.1 kg)   SpO2 95%   BMI 27.23 kg/m  Patient is a pleasant Caucasian male in NAD. 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 He feels well from a cardiopulmonary standpoint. No new symptoms since last cardiology visit. No SOB at rest or with walking at stores. He primarily notices exertional dyspnea if he is carrying something heavy. He was going to the gym on occasion and cycling for 20-30 minutes, but has not done this in six months due to hip pain (he thinkgs maybe bursitis). Overall weights have been stable, although HHRN called today and reported he may have to take Zaroxolyn today based on his morning weight. He denied any significant edema. He does not  see a nephrologist. Dr. Melford Aase follows and reportedly numbers have been stable. Denied urinary retention. He reports that DM medication was increased after his last A1c and says home  readings have been better (reports typically 180 or less).  Exam with heart overall with regular rhythm but with occasional ectopic beat. Lungs diminished but clear. No ankle edema. Mild restriction with side to side neck movement. Good mouth opening.   EKG 12/09/16 (reviewed by Dr. Sung Amabile): SR, first degree AV block, occasional PVC, right BBB. RBBB appears new when compared to 03/1916 tracing.  Echo 12/16/16: Study Conclusions - Left ventricle: The cavity size was normal. There was moderate   concentric hypertrophy. Systolic function was normal. The   estimated ejection fraction was in the range of 60% to 65%. Wall   motion was normal; there were no regional wall motion   abnormalities. The study is not technically sufficient to allow   evaluation of LV diastolic function. - Aorta: Ascending aorta 4.1 cm. - Ascending aorta: The ascending aorta was mildly dilated. - Mitral valve: Transvalvular velocity was within the normal range.   There was no evidence for stenosis. There was mild regurgitation. - Left atrium: The atrium was severely dilated. - Right ventricle: The cavity size was mildly dilated. Wall   thickness was normal. Systolic function was normal. - Right atrium: The atrium was severely dilated. - Tricuspid valve: There was mild regurgitation. - Pulmonary arteries: Systolic pressure was moderately increased.   PA peak pressure: 54 mm Hg (S). (TEE 06/05/14: smallPFO with left to right shunting)   PFTs 07/30/14: FEV1 1.45 L (45%) FVC 1.77 L (40%) DLCO 46%  Cardiac CT 05/10/14: IMPRESSION: 1. 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 This anomalous vein drains high into SVC with the long distance between the anomalous vein and RA-SVC junction measuring 6 cm. This anomalous drain provides drainage from the right upper lung lobe. There is no associated atrial septal defect. 2. Moderately dilated right ventricle, severely  dilated right atrium. Dilated pulmonary artery measuring 41.5 x 41.5 mm consistent with pulmonary hypertension. 3. Severe two vessel CAD (LAD, RI). Patent LIMA to LAD and SVG to RI.  RHC 05/03/14: RA = 18 RV = 72/4/17 PA = 76/27 (46) PCW = 21 Fick cardiac output/index (using PA sat) = 9.2/4.45 Thermo CO/CI = 10.0/4.87 PVR = 2.2 WU Fick cardiac output/index (using high SVC sat) = 5.2/2.5 Pulse-ox saturation = 89%  High SVC sat = 54% Low SVC sat = 81% (at SVC/RA junction) RA sat = 68% RV sat = 66% PA sat = 68%, 69% IVC sat =56%  Assessment: 1. Moderate pulmonary HTN secondary to high output HF due to left to right shunting at the level of the junction of the SVC/RA - suspect anomalous pulmonary venous return Plan/Discussion: Will proceed with cardiac CT to further evaluate left to right shunt etiology.   CPX 04/18/14: Exercise testing with gas exchange demonstrates a moderately reduced peak VO2 of 12.1 ml/kg/min (47.8% of the age/gender/weight matched sedentary norms). The RER of 1.10 indicates a maximum effort. When adjusted to the patient's ideal body weight of 180.3 lb (81.8 kg) the peak VO2 is 13.5 ml/kg (ibw)/min (53% of the ibw-adjusted predicted). The VE/VCO2 slope is elevated and indicates excessive dead space ventilation. The oxygen uptake efficiency slope (OUES) is moderately reduced and is reflective of patient's measured functional capacity. The VO2 at the ventilatory threshold was normal at 40% of the  predicted peak VO2. At peak exercise, the ventilation reached 67.4% of the measured MVV indicating ventilatory reserve remained. The O2pulse (a surrogate for stroke volume) increased throughout the exercise reaching 11 ml/beat. (69% predicted). (He was referred for RHC.)  Carotid U/S 04/03/13: Summary: BIlateral: mild mixed plaque origin and proximal ICA. 1-39% ICA stenosis. Vertebral artery flow is antegrade.  CXR 10/05/16: IMPRESSION: Mild cardiomegaly and  left lung scarring.  No active disease.  Preoperative labs noted. BUN 25, Cr 1.58 (down from 51/2.62 on 12/25/16). Glucose 332 (non-fasting). H/H 12.6/40.0, PLT 171. A1c is still in progress (last was 9.8 on 12/11/16). He is for PT/INR on the day of surgery. Discussed with patient that I would let Dr. Hulen Skains know if A1c was > 9 and that an elevated fasting glucose on the morning of surgery could cancel or delay his surgery. He reports fasting home CBGs typically < 200 though.  Patient appeared well at PAT. No conversational dyspnea. No acute CHF symptoms. Renal function and anemia improved since 12/2016. Reviewed above with anesthesiologist Dr. Roberts Gaudy. If no acute changes then it is anticipated that he can proceed as planned; however, he recommended a preoperative CXR to evaluate for changes of pulmonary edema. Will place order.  I'll follow-up on A1c results. Anesthesiologist to re-evaluate on the day of surgery.   George Hugh [facility] Short Stay Center/Anesthesiology Phone [phone] 03/01/2017 3:33 PM  Addendum: Unfortunately, Mr. Mattie A1c is back up to 10.2 (previously 10.9 on 10/05/16, 9.8 on 12/11/16). I notified [facility] triage nurse Sunday Spillers who will notify Dr. Hulen Skains. Discussed that if patient arrived with a significantly elevated fasting glucose on the day of surgery case could be delayed or cancelled. If case remains as scheduled then patient will get a fasting CBG on arrival. I will route A1c result to Dr. Melford Aase.   George Hugh [facility] Short Stay Center/Anesthesiology Phone [phone] 03/02/2017 9:53 AM

## 2017-03-02 LAB — HEMOGLOBIN A1C
HEMOGLOBIN A1C: 10.2 % — AB (ref 4.8–5.6)
MEAN PLASMA GLUCOSE: 246 mg/dL

## 2017-03-02 LAB — PROTIME-INR
INR: 1.8 — ABNORMAL HIGH
Prothrombin Time: 19.1 s — ABNORMAL HIGH (ref 9.0–11.5)

## 2017-03-05 ENCOUNTER — Encounter (HOSPITAL_COMMUNITY): Payer: Self-pay | Admitting: General Practice

## 2017-03-05 ENCOUNTER — Observation Stay (HOSPITAL_COMMUNITY)
Admission: RE | Admit: 2017-03-05 | Discharge: 2017-03-06 | Disposition: A | Discharge status: Home / Self Care | Payer: Medicare Other | Source: Ambulatory Visit | Attending: General Surgery | Admitting: General Surgery

## 2017-03-05 ENCOUNTER — Encounter (HOSPITAL_COMMUNITY)
Admission: RE | Disposition: A | Discharge status: Home / Self Care | Payer: Self-pay | Source: Ambulatory Visit | Attending: General Surgery

## 2017-03-05 ENCOUNTER — Ambulatory Visit (HOSPITAL_COMMUNITY): Payer: Medicare Other | Admitting: Vascular Surgery

## 2017-03-05 ENCOUNTER — Other Ambulatory Visit: Payer: Self-pay

## 2017-03-05 ENCOUNTER — Ambulatory Visit (HOSPITAL_COMMUNITY): Payer: Medicare Other | Admitting: Certified Registered"

## 2017-03-05 ENCOUNTER — Ambulatory Visit (HOSPITAL_COMMUNITY): Payer: Medicare Other

## 2017-03-05 DIAGNOSIS — Z951 Presence of aortocoronary bypass graft: Secondary | ICD-10-CM | POA: Diagnosis not present

## 2017-03-05 DIAGNOSIS — I739 Peripheral vascular disease, unspecified: Secondary | ICD-10-CM | POA: Insufficient documentation

## 2017-03-05 DIAGNOSIS — E1122 Type 2 diabetes mellitus with diabetic chronic kidney disease: Secondary | ICD-10-CM | POA: Diagnosis not present

## 2017-03-05 DIAGNOSIS — I130 Hypertensive heart and chronic kidney disease with heart failure and stage 1 through stage 4 chronic kidney disease, or unspecified chronic kidney disease: Secondary | ICD-10-CM | POA: Insufficient documentation

## 2017-03-05 DIAGNOSIS — I272 Pulmonary hypertension, unspecified: Secondary | ICD-10-CM

## 2017-03-05 DIAGNOSIS — D121 Benign neoplasm of appendix: Secondary | ICD-10-CM | POA: Diagnosis not present

## 2017-03-05 DIAGNOSIS — Z01818 Encounter for other preprocedural examination: Secondary | ICD-10-CM

## 2017-03-05 DIAGNOSIS — E7800 Pure hypercholesterolemia, unspecified: Secondary | ICD-10-CM | POA: Diagnosis not present

## 2017-03-05 DIAGNOSIS — Z7901 Long term (current) use of anticoagulants: Secondary | ICD-10-CM | POA: Insufficient documentation

## 2017-03-05 DIAGNOSIS — Z79899 Other long term (current) drug therapy: Secondary | ICD-10-CM | POA: Diagnosis not present

## 2017-03-05 DIAGNOSIS — N189 Chronic kidney disease, unspecified: Secondary | ICD-10-CM | POA: Diagnosis not present

## 2017-03-05 DIAGNOSIS — I2510 Atherosclerotic heart disease of native coronary artery without angina pectoris: Secondary | ICD-10-CM | POA: Insufficient documentation

## 2017-03-05 DIAGNOSIS — K219 Gastro-esophageal reflux disease without esophagitis: Secondary | ICD-10-CM | POA: Diagnosis not present

## 2017-03-05 DIAGNOSIS — I5032 Chronic diastolic (congestive) heart failure: Secondary | ICD-10-CM | POA: Diagnosis not present

## 2017-03-05 DIAGNOSIS — I509 Heart failure, unspecified: Secondary | ICD-10-CM | POA: Diagnosis not present

## 2017-03-05 DIAGNOSIS — I4891 Unspecified atrial fibrillation: Secondary | ICD-10-CM | POA: Diagnosis not present

## 2017-03-05 DIAGNOSIS — J9811 Atelectasis: Secondary | ICD-10-CM | POA: Diagnosis not present

## 2017-03-05 DIAGNOSIS — Z7984 Long term (current) use of oral hypoglycemic drugs: Secondary | ICD-10-CM | POA: Insufficient documentation

## 2017-03-05 DIAGNOSIS — I110 Hypertensive heart disease with heart failure: Secondary | ICD-10-CM | POA: Insufficient documentation

## 2017-03-05 DIAGNOSIS — M1990 Unspecified osteoarthritis, unspecified site: Secondary | ICD-10-CM | POA: Diagnosis not present

## 2017-03-05 DIAGNOSIS — N183 Chronic kidney disease, stage 3 (moderate): Secondary | ICD-10-CM | POA: Diagnosis not present

## 2017-03-05 DIAGNOSIS — Z87891 Personal history of nicotine dependence: Secondary | ICD-10-CM | POA: Insufficient documentation

## 2017-03-05 DIAGNOSIS — G4730 Sleep apnea, unspecified: Secondary | ICD-10-CM | POA: Insufficient documentation

## 2017-03-05 HISTORY — PX: APPENDECTOMY: SHX54

## 2017-03-05 HISTORY — PX: LAPAROSCOPIC APPENDECTOMY: SHX408

## 2017-03-05 LAB — PROTIME-INR
INR: 1.16
Prothrombin Time: 14.7 seconds (ref 11.4–15.2)

## 2017-03-05 LAB — GLUCOSE, CAPILLARY
GLUCOSE-CAPILLARY: 248 mg/dL — AB (ref 65–99)
Glucose-Capillary: 189 mg/dL — ABNORMAL HIGH (ref 65–99)
Glucose-Capillary: 270 mg/dL — ABNORMAL HIGH (ref 65–99)

## 2017-03-05 SURGERY — APPENDECTOMY, LAPAROSCOPIC
Anesthesia: General | Site: Abdomen

## 2017-03-05 MED ORDER — BUPIVACAINE-EPINEPHRINE 0.5% -1:200000 IJ SOLN
INTRAMUSCULAR | Status: DC | PRN
  Administered 2017-03-05: 10 mL

## 2017-03-05 MED ORDER — INSULIN ASPART 100 UNIT/ML ~~LOC~~ SOLN
6.0000 [IU] | Freq: Once | SUBCUTANEOUS | Status: AC
  Administered 2017-03-05: 6 [IU] via SUBCUTANEOUS

## 2017-03-05 MED ORDER — INSULIN ASPART 100 UNIT/ML ~~LOC~~ SOLN
SUBCUTANEOUS | Status: AC
  Filled 2017-03-05: qty 1

## 2017-03-05 MED ORDER — ONDANSETRON HCL 4 MG/2ML IJ SOLN: 4.0000 mg | Freq: Four times a day (QID) | INTRAMUSCULAR | Status: DC | PRN

## 2017-03-05 MED ORDER — HYDROCODONE-ACETAMINOPHEN 5-325 MG PO TABS: 1.0000 | ORAL_TABLET | Freq: Four times a day (QID) | ORAL | Status: DC | PRN

## 2017-03-05 MED ORDER — PROPOFOL 10 MG/ML IV BOLUS
INTRAVENOUS | Status: AC
  Filled 2017-03-05: qty 40

## 2017-03-05 MED ORDER — VITAMIN D3 25 MCG (1000 UNIT) PO TABS
5000.0000 [IU] | ORAL_TABLET | Freq: Every day | ORAL | Status: DC
  Administered 2017-03-05: 5000 [IU] via ORAL
  Filled 2017-03-05 (×2): qty 5

## 2017-03-05 MED ORDER — SUGAMMADEX SODIUM 200 MG/2ML IV SOLN
INTRAVENOUS | Status: DC | PRN
  Administered 2017-03-05: 200 mg via INTRAVENOUS

## 2017-03-05 MED ORDER — ONDANSETRON 4 MG PO TBDP: 4.0000 mg | ORAL_TABLET | Freq: Four times a day (QID) | ORAL | Status: DC | PRN

## 2017-03-05 MED ORDER — ACETAMINOPHEN 500 MG PO TABS
1000.0000 mg | ORAL_TABLET | Freq: Three times a day (TID) | ORAL | Status: DC
  Administered 2017-03-05 – 2017-03-06 (×3): 1000 mg via ORAL
  Filled 2017-03-05 (×3): qty 2

## 2017-03-05 MED ORDER — ASPIRIN EC 81 MG PO TBEC
81.0000 mg | DELAYED_RELEASE_TABLET | Freq: Every day | ORAL | Status: DC
  Administered 2017-03-06: 81 mg via ORAL
  Filled 2017-03-05: qty 1

## 2017-03-05 MED ORDER — SODIUM CHLORIDE 0.9 % IV SOLN: INTRAVENOUS | Status: DC

## 2017-03-05 MED ORDER — CHLORHEXIDINE GLUCONATE CLOTH 2 % EX PADS: 6.0000 | MEDICATED_PAD | Freq: Once | CUTANEOUS | Status: DC

## 2017-03-05 MED ORDER — GABAPENTIN 300 MG PO CAPS
300.0000 mg | ORAL_CAPSULE | Freq: Two times a day (BID) | ORAL | Status: DC
  Administered 2017-03-05 – 2017-03-06 (×3): 300 mg via ORAL
  Filled 2017-03-05 (×3): qty 1

## 2017-03-05 MED ORDER — ALPRAZOLAM 0.5 MG PO TABS: 0.5000 mg | ORAL_TABLET | Freq: Every evening | ORAL | Status: DC | PRN

## 2017-03-05 MED ORDER — MIDAZOLAM HCL 2 MG/2ML IJ SOLN
INTRAMUSCULAR | Status: AC
  Filled 2017-03-05: qty 2

## 2017-03-05 MED ORDER — DIPHENHYDRAMINE HCL 25 MG PO CAPS: 25.0000 mg | ORAL_CAPSULE | Freq: Every evening | ORAL | Status: DC | PRN

## 2017-03-05 MED ORDER — COLCHICINE 0.6 MG PO TABS: 0.6000 mg | ORAL_TABLET | Freq: Every day | ORAL | Status: DC | PRN

## 2017-03-05 MED ORDER — METFORMIN HCL ER 500 MG PO TB24
500.0000 mg | ORAL_TABLET | Freq: Two times a day (BID) | ORAL | Status: DC
  Administered 2017-03-05 – 2017-03-06 (×2): 500 mg via ORAL
  Filled 2017-03-05 (×2): qty 1

## 2017-03-05 MED ORDER — LIDOCAINE HCL (CARDIAC) 20 MG/ML IV SOLN
INTRAVENOUS | Status: DC | PRN
  Administered 2017-03-05: 100 mg via INTRAVENOUS

## 2017-03-05 MED ORDER — GLIPIZIDE 5 MG PO TABS
10.0000 mg | ORAL_TABLET | Freq: Three times a day (TID) | ORAL | Status: DC
  Administered 2017-03-05 – 2017-03-06 (×2): 10 mg via ORAL
  Filled 2017-03-05 (×2): qty 2

## 2017-03-05 MED ORDER — SODIUM CHLORIDE 0.9 % IR SOLN
Status: DC | PRN
  Administered 2017-03-05: 1000 mL

## 2017-03-05 MED ORDER — VASOPRESSIN 20 UNIT/ML IV SOLN
INTRAVENOUS | Status: AC
  Filled 2017-03-05: qty 1

## 2017-03-05 MED ORDER — ENOXAPARIN SODIUM 30 MG/0.3ML ~~LOC~~ SOLN: 30.0000 mg | SUBCUTANEOUS | Status: DC

## 2017-03-05 MED ORDER — ROCURONIUM BROMIDE 100 MG/10ML IV SOLN
INTRAVENOUS | Status: DC | PRN
  Administered 2017-03-05: 50 mg via INTRAVENOUS

## 2017-03-05 MED ORDER — 0.9 % SODIUM CHLORIDE (POUR BTL) OPTIME
TOPICAL | Status: DC | PRN
  Administered 2017-03-05: 1000 mL

## 2017-03-05 MED ORDER — FERROUS SULFATE 325 (65 FE) MG PO TABS
325.0000 mg | ORAL_TABLET | Freq: Two times a day (BID) | ORAL | Status: DC
  Administered 2017-03-05 – 2017-03-06 (×2): 325 mg via ORAL
  Filled 2017-03-05 (×2): qty 1

## 2017-03-05 MED ORDER — PROPOFOL 10 MG/ML IV BOLUS
INTRAVENOUS | Status: DC | PRN
  Administered 2017-03-05: 160 mg via INTRAVENOUS

## 2017-03-05 MED ORDER — HYDROCODONE-ACETAMINOPHEN 7.5-325 MG PO TABS: 1.0000 | ORAL_TABLET | Freq: Once | ORAL | Status: DC | PRN

## 2017-03-05 MED ORDER — METOLAZONE 5 MG PO TABS
5.0000 mg | ORAL_TABLET | Freq: Every day | ORAL | Status: DC | PRN
  Filled 2017-03-05: qty 1

## 2017-03-05 MED ORDER — HYDROMORPHONE HCL 1 MG/ML IJ SOLN
0.2500 mg | INTRAMUSCULAR | Status: DC | PRN
  Administered 2017-03-05 (×2): 0.25 mg via INTRAVENOUS

## 2017-03-05 MED ORDER — CEFOTETAN DISODIUM-DEXTROSE 2-2.08 GM-%(50ML) IV SOLR
2.0000 g | INTRAVENOUS | Status: AC
  Administered 2017-03-05: 2 g via INTRAVENOUS
  Filled 2017-03-05: qty 50

## 2017-03-05 MED ORDER — ACETAMINOPHEN 10 MG/ML IV SOLN: 1000.0000 mg | Freq: Once | INTRAVENOUS | Status: DC | PRN

## 2017-03-05 MED ORDER — EPHEDRINE SULFATE 50 MG/ML IJ SOLN
INTRAMUSCULAR | Status: DC | PRN
  Administered 2017-03-05 (×2): 10 mg via INTRAVENOUS
  Administered 2017-03-05: 5 mg via INTRAVENOUS

## 2017-03-05 MED ORDER — HYDROMORPHONE HCL 1 MG/ML IJ SOLN
INTRAMUSCULAR | Status: AC
  Filled 2017-03-05: qty 1

## 2017-03-05 MED ORDER — BUPIVACAINE-EPINEPHRINE (PF) 0.5% -1:200000 IJ SOLN
INTRAMUSCULAR | Status: AC
  Filled 2017-03-05: qty 30

## 2017-03-05 MED ORDER — ONDANSETRON HCL 4 MG/2ML IJ SOLN
INTRAMUSCULAR | Status: DC | PRN
  Administered 2017-03-05: 4 mg via INTRAVENOUS

## 2017-03-05 MED ORDER — ATORVASTATIN CALCIUM 20 MG PO TABS
20.0000 mg | ORAL_TABLET | ORAL | Status: DC
Start: 2017-03-05 — End: 2017-03-06
  Administered 2017-03-05: 20 mg via ORAL
  Filled 2017-03-05: qty 1

## 2017-03-05 MED ORDER — FENTANYL CITRATE (PF) 100 MCG/2ML IJ SOLN
INTRAMUSCULAR | Status: DC | PRN
  Administered 2017-03-05: 100 ug via INTRAVENOUS
  Administered 2017-03-05: 50 ug via INTRAVENOUS

## 2017-03-05 MED ORDER — ALLOPURINOL 300 MG PO TABS
300.0000 mg | ORAL_TABLET | Freq: Every day | ORAL | Status: DC
  Administered 2017-03-05 – 2017-03-06 (×2): 300 mg via ORAL
  Filled 2017-03-05 (×2): qty 1

## 2017-03-05 MED ORDER — PROMETHAZINE HCL 25 MG/ML IJ SOLN: 6.2500 mg | INTRAMUSCULAR | Status: DC | PRN

## 2017-03-05 MED ORDER — SODIUM CHLORIDE 0.9 % IV BOLUS (SEPSIS): 250.0000 mL | Freq: Once | INTRAVENOUS | Status: DC

## 2017-03-05 MED ORDER — SODIUM CHLORIDE 0.9 % IV SOLN
INTRAVENOUS | Status: DC
  Administered 2017-03-05: 09:00:00 via INTRAVENOUS

## 2017-03-05 MED ORDER — FLUTICASONE PROPIONATE 50 MCG/ACT NA SUSP: 1.0000 | Freq: Every day | NASAL | Status: DC | PRN

## 2017-03-05 MED ORDER — MEPERIDINE HCL 50 MG/ML IJ SOLN: 6.2500 mg | INTRAMUSCULAR | Status: DC | PRN

## 2017-03-05 MED ORDER — AZELASTINE HCL 0.1 % NA SOLN: 2.0000 | Freq: Two times a day (BID) | NASAL | Status: DC | PRN

## 2017-03-05 MED ORDER — LACTATED RINGERS IV SOLN
INTRAVENOUS | Status: DC | PRN
  Administered 2017-03-05: 10:00:00 via INTRAVENOUS

## 2017-03-05 MED ORDER — CARVEDILOL 6.25 MG PO TABS: 6.2500 mg | ORAL_TABLET | Freq: Two times a day (BID) | ORAL | Status: DC

## 2017-03-05 MED ORDER — PHENYLEPHRINE HCL 10 MG/ML IJ SOLN
INTRAMUSCULAR | Status: DC | PRN
  Administered 2017-03-05 (×5): 100 ug via INTRAVENOUS

## 2017-03-05 MED ORDER — FENTANYL CITRATE (PF) 250 MCG/5ML IJ SOLN
INTRAMUSCULAR | Status: AC
  Filled 2017-03-05: qty 5

## 2017-03-05 SURGICAL SUPPLY — 46 items
ADH SKN CLS APL DERMABOND .7 (GAUZE/BANDAGES/DRESSINGS) ×1
APPLIER CLIP ROT 10 11.4 M/L (STAPLE)
APR CLP MED LRG 11.4X10 (STAPLE)
BAG SPEC RTRVL LRG 6X4 10 (ENDOMECHANICALS) ×1
BLADE CLIPPER SURG (BLADE) ×2 IMPLANT
CANISTER SUCT 3000ML PPV (MISCELLANEOUS) ×3 IMPLANT
CHLORAPREP W/TINT 26ML (MISCELLANEOUS) ×3 IMPLANT
CLIP APPLIE ROT 10 11.4 M/L (STAPLE) IMPLANT
CLOSURE WOUND 1/2 X4 (GAUZE/BANDAGES/DRESSINGS) ×1
COVER SURGICAL LIGHT HANDLE (MISCELLANEOUS) ×3 IMPLANT
CUTTER FLEX LINEAR 45M (STAPLE) ×3 IMPLANT
DERMABOND ADVANCED (GAUZE/BANDAGES/DRESSINGS) ×2
DERMABOND ADVANCED .7 DNX12 (GAUZE/BANDAGES/DRESSINGS) ×1 IMPLANT
DRSG TEGADERM 2-3/8X2-3/4 SM (GAUZE/BANDAGES/DRESSINGS) ×9 IMPLANT
ELECT REM PT RETURN 9FT ADLT (ELECTROSURGICAL) ×3
ELECTRODE REM PT RTRN 9FT ADLT (ELECTROSURGICAL) ×1 IMPLANT
ENDOLOOP SUT PDS II  0 18 (SUTURE)
ENDOLOOP SUT PDS II 0 18 (SUTURE) IMPLANT
GLOVE BIOGEL PI IND STRL 8 (GLOVE) ×1 IMPLANT
GLOVE BIOGEL PI INDICATOR 8 (GLOVE) ×2
GLOVE ECLIPSE 7.5 STRL STRAW (GLOVE) ×3 IMPLANT
GOWN STRL REUS W/ TWL LRG LVL3 (GOWN DISPOSABLE) ×3 IMPLANT
GOWN STRL REUS W/TWL LRG LVL3 (GOWN DISPOSABLE) ×9
KIT BASIN OR (CUSTOM PROCEDURE TRAY) ×3 IMPLANT
KIT ROOM TURNOVER OR (KITS) ×3 IMPLANT
NS IRRIG 1000ML POUR BTL (IV SOLUTION) ×3 IMPLANT
PAD ARMBOARD 7.5X6 YLW CONV (MISCELLANEOUS) ×6 IMPLANT
POUCH SPECIMEN RETRIEVAL 10MM (ENDOMECHANICALS) ×3 IMPLANT
RELOAD 45 VASCULAR/THIN (ENDOMECHANICALS) IMPLANT
RELOAD STAPLE 45 2.5 WHT GRN (ENDOMECHANICALS) IMPLANT
RELOAD STAPLE 45 3.5 BLU ETS (ENDOMECHANICALS) IMPLANT
RELOAD STAPLE TA45 3.5 REG BLU (ENDOMECHANICALS) ×3 IMPLANT
SET IRRIG TUBING LAPAROSCOPIC (IRRIGATION / IRRIGATOR) ×3 IMPLANT
SHEARS HARMONIC ACE PLUS 36CM (ENDOMECHANICALS) ×3 IMPLANT
SLEEVE ENDOPATH XCEL 5M (ENDOMECHANICALS) ×3 IMPLANT
SPECIMEN JAR SMALL (MISCELLANEOUS) ×3 IMPLANT
STRIP CLOSURE SKIN 1/2X4 (GAUZE/BANDAGES/DRESSINGS) ×2 IMPLANT
SUT MNCRL AB 4-0 PS2 18 (SUTURE) ×3 IMPLANT
TOWEL OR 17X24 6PK STRL BLUE (TOWEL DISPOSABLE) ×3 IMPLANT
TOWEL OR 17X26 10 PK STRL BLUE (TOWEL DISPOSABLE) ×3 IMPLANT
TRAY FOLEY CATH SILVER 16FR (SET/KITS/TRAYS/PACK) ×3 IMPLANT
TRAY LAPAROSCOPIC MC (CUSTOM PROCEDURE TRAY) ×3 IMPLANT
TROCAR XCEL BLUNT TIP 100MML (ENDOMECHANICALS) ×3 IMPLANT
TROCAR XCEL NON-BLD 5MMX100MML (ENDOMECHANICALS) ×3 IMPLANT
TUBING INSUFFLATION (TUBING) ×3 IMPLANT
WATER STERILE IRR 1000ML POUR (IV SOLUTION) ×3 IMPLANT

## 2017-03-05 NOTE — H&P (Signed)
[name] Documented: 01/29/2017 3:25 PM Location: [facility] Patient #: [id number] DOB: 1940-09-27 Married / Language: [name] / Race: White Male   History of Present Illness [name]. Hulen Skains MD; 01/29/2017 4:12 PM) Patient words: Here after colonoscopy demonstrating an appendiceal polyp, tubular adenoma, confined to the appendix. No prior abdominal surgery. On coumadin.  The patient is a 77 year old male.   Past Surgical History [name], [place]; 01/29/2017 3:25 PM) Cataract Surgery  Bilateral. Colon Polyp Removal - Colonoscopy  Coronary Artery Bypass Graft  Foot Surgery  Right.  Diagnostic Studies History [name], [place]; 01/29/2017 3:25 PM) Colonoscopy  within last year  Allergies [name], RMA; 01/29/2017 3:30 PM) Tetanus Toxoid Adsorbed *TOXOIDS*  Horse Derived Products  SUNFLOWER SEED  Allergies Reconciled   Medication History [name], RMA; 01/29/2017 3:41 PM) Allopurinol (300MG  Tablet, Oral) Active. Xanax (1MG  Tablet, Oral) Active. Gaviscon Extra Strength (160-105MG  Tablet Chewable, Oral) Active. Lipitor (40MG  Tablet, Oral) Active. Azelastine HCl (0.1% Solution, Nasal) Active. Bactericin (500UNIT/GM Ointment, External) Active. Coreg (6.25MG  Tablet, Oral) Active. Vitamin D3 (5000UNIT Capsule, Oral) Active. Colchicine (0.6MG  Capsule, Oral) Active. Ferretts (325 (106 Fe)MG Tablet, Oral) Active. Flonase (50MCG/ACT Suspension, Nasal) Active. GlipiZIDE (10MG  Tablet, Oral) Active. Claritin (10MG  Capsule, Oral) Active. MetFORMIN HCl (500MG  Tablet, Oral) Active. MetOLazone (5MG  Tablet, Oral) Active. Multivitamin Adults (Oral) Active. Polyvinyl Alcohol (1.4% Solution, Ophthalmic) Active. Potassium Chloride (20MEQ Tablet ER, Oral) Active. Zantac (150MG  Tablet, Oral) Active. Zoloft (100MG  Tablet, Oral) Active. Diovan HCT (320-25MG  Tablet, Oral) Active. Warfarin Sodium (5MG  Tablet, Oral) Active. Medications  Reconciled  Social History [name], [place]; 01/29/2017 3:25 PM) Alcohol use  Occasional alcohol use. No caffeine use  No drug use  Tobacco use  Former smoker.  Family History [name], [place]; 01/29/2017 3:25 PM) Arthritis  Father. Colon Cancer  Mother. Heart Disease  Mother. Hypertension  Mother. Rectal Cancer  Mother. Thyroid problems  Sister.  Other Problems [name], [place]; 01/29/2017 3:25 PM) Arthritis  Atrial Fibrillation  Chronic Renal Failure Syndrome  Congestive Heart Failure  Depression  Diabetes Mellitus  Gastroesophageal Reflux Disease  High blood pressure  Hypercholesterolemia  Melanoma  Sleep Apnea     Review of Systems [name] RMA; 01/29/2017 3:25 PM) General Present- Fatigue. Not Present- Appetite Loss, Chills, Fever, Night Sweats, Weight Gain and Weight Loss. HEENT Present- Wears glasses/contact lenses. Not Present- Earache, Hearing Loss, Hoarseness, Nose Bleed, Oral Ulcers, Ringing in the Ears, Seasonal Allergies, Sinus Pain, Sore Throat, Visual Disturbances and Yellow Eyes. Respiratory Present- Difficulty Breathing. Not Present- Bloody sputum, Chronic Cough, Snoring and Wheezing. Cardiovascular Present- Shortness of Breath. Not Present- Chest Pain, Difficulty Breathing Lying Down, Leg Cramps, Palpitations, Rapid Heart Rate and Swelling of Extremities. Gastrointestinal Present- Hemorrhoids. Not Present- Abdominal Pain, Bloating, Bloody Stool, Change in Bowel Habits, Chronic diarrhea, Constipation, Difficulty Swallowing, Excessive gas, Gets full quickly at meals, Indigestion, Nausea, Rectal Pain and Vomiting. Male Genitourinary Present- Urgency. Not Present- Blood in Urine, Change in Urinary Stream, Frequency, Impotence, Nocturia, Painful Urination and Urine Leakage. Musculoskeletal Present- Joint Pain and Muscle Weakness. Not Present- Back Pain, Joint Stiffness, Muscle Pain and Swelling of Extremities. Psychiatric Present-  Depression. Not Present- Anxiety, Bipolar, Change in Sleep Pattern, Fearful and Frequent crying. Hematology Present- Blood Thinners. Not Present- Easy Bruising, Excessive bleeding, Gland problems, HIV and Persistent Infections.  Vitals [name] RMA; 01/29/2017 3:26 PM) 01/29/2017 3:26 PM Weight: 187 lb Height: 70in Body Surface Area: 2.03 m Body Mass Index: 26.83 kg/m  Temp.: 97.74F  BP: 140/90 (Sitting, Left Arm, Standard) Today, P 74, BP 106/64   Physical Exam (Sabrin Dunlevy O. Hulen Skains MD; 01/29/2017 4:13 PM) General Mental Status-Alert. General Appearance-Well groomed. Orientation-Oriented X4. Build & Nutrition-Asthenic and Well nourished. Voice-Hoarse(Very slight).  Chest and Lung Exam Chest and lung exam reveals -normal excursion with symmetric chest walls, normal resonance, no flatness or dullness and on auscultation, normal breath sounds, no adventitious sounds and normal vocal resonance.  Abdomen Inspection Inspection of the abdomen reveals - No Visible peristalsis and No Hernias. Palpation/Percussion Palpation and Percussion of the abdomen reveal - Non Tender. No abdominal pain.   Assessment & Plan [name] O. 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 Also had some possible bleeding AVMs that were cauterized. No referred for appendiceal polyp for removal  Laparoscopic appendectomy show be adequate. Pathology is benign.  Needs cardiac clearance. EF 65% by report  Cardiac clearance performed and the patient is at high risk, but he has a potential appendiceal tumor.  Will go ahead with laparoscopic appendectomy  [name]. Dahlia Bailiff, MD, [place] [phone] [facility]

## 2017-03-05 NOTE — Anesthesia Preprocedure Evaluation (Addendum)
Anesthesia Evaluation  Patient identified by MRN, date of birth, ID band Patient awake    Reviewed: Allergy & Precautions, NPO status , Patient's Chart, lab work & pertinent test results  Airway Mallampati: II  TM Distance: >3 FB Neck ROM: Full    Dental no notable dental hx.    Pulmonary former smoker,    Pulmonary exam normal breath sounds clear to auscultation       Cardiovascular hypertension, + CAD, + Peripheral Vascular Disease and +CHF  Normal cardiovascular exam Rhythm:Regular Rate:Normal     Neuro/Psych    GI/Hepatic GERD  ,  Endo/Other  diabetes  Renal/GU      Musculoskeletal   Abdominal   Peds  Hematology   Anesthesia Other Findings   Reproductive/Obstetrics                            Anesthesia Physical Anesthesia Plan  ASA: III  Anesthesia Plan: General   Post-op Pain Management:    Induction: Intravenous  PONV Risk Score and Plan: 2 and Treatment may vary due to age or medical condition and Ondansetron  Airway Management Planned: Oral ETT  Additional Equipment:   Intra-op Plan:   Post-operative Plan: Extubation in OR  Informed Consent: I have reviewed the patients History and Physical, chart, labs and discussed the procedure including the risks, benefits and alternatives for the proposed anesthesia with the patient or authorized representative who has indicated his/her understanding and acceptance.   Dental advisory given  Plan Discussed with: CRNA  Anesthesia Plan Comments:         Anesthesia Quick Evaluation

## 2017-03-05 NOTE — Progress Notes (Signed)
SBP 86. p 64 patient asymptomatic. MD made aware.

## 2017-03-05 NOTE — Op Note (Signed)
OPERATIVE REPORT  DATE OF OPERATION: 03/05/2017  PATIENT:  [name]  77 y.o. male  PRE-OPERATIVE DIAGNOSIS:  APPENDICEAL ORIFICE POLYP  POST-OPERATIVE DIAGNOSIS:  APPENDICEAL ORIFICE POLYP  INDICATION(S) FOR OPERATION: The patient underwent colonoscopy which demonstrated a polyp in the appendiceal orifice.  FINDINGS: We were able to get below the origin of the polyp with a 3-4 cm margin.  The specimen did demonstrate the polyp to be within the appendiceal orifice.  PROCEDURE:  Procedure(s): APPENDECTOMY LAPAROSCOPIC  SURGEON:  Surgeon(s): Judeth Horn, MD  ASSISTANT: None  ANESTHESIA:   general  COMPLICATIONS: None  EBL: <20 ml  BLOOD ADMINISTERED: none  DRAINS: none   SPECIMEN:  Source of Specimen:  Appendix with base of cecum  COUNTS CORRECT:  YES  PROCEDURE DETAILS: The patient was taken to the operating room and placed on table in supine position.  After an adequate general endotracheal anesthetic was administered, he was prepped and draped in the usual sterile manner exposing his abdomen.  A proper timeout was performed identifying the patient and procedure to be performed.  We started with an infraumbilical midline incision with a 15 blade down to and through the midline fascia.  We bluntly dissected down to the peritoneal cavity then placed a pursestring suture of 0 Vicryl around the fascial opening.  A Hassan cannula was passed through the fascial opening and secured in place with a pursestring suture.  We then insufflated carbon dioxide gas up to a maximal intra-abdominal pressure of 15 mmHg.  We subsequently passed the right upper quadrant and then a left lower quadrant 5 mm cannula under direct vision.  The patient was placed in Trendelenburg and the left side was tilted down.  Were able to localize the appendix in the right midportion of the abdomen mobilized from its lateral peritoneal attachments using a harmonic scalpel.  We also dissected out the  mesoappendix and took down its attachments using the harmonic scalpel.  We skeletonized the base of the cecum and then subsequently used a blue cartridge Endo GIA come across the base of the cecum approximately 2 cm away from the orifice of the appendix.  We detached the specimen and was treated using an Endo Catch bag.  Once this was done the surgeon took the specimen off the field and opened it to determined that the appendiceal orifice polyp was removed.  This confirmed that the actual polyp was removed.  Hemostasis was obtained using electrocautery and then was subsequently aspirated all fluid and gas from the peritoneal cavity.  The infraumbilical fascial site was closed using the pursestring suture which was in place.  We aspirated all fluid and gas from about the liver as the patient was placed back in the neutral position.  We we then removed all cannulas.  We injected 0.25% Marcaine at all sites.  We then closed using Monocryl at the infraumbilical skin site.  Dermabond Steri-Strips and Tegaderms were used to complete all dressings all needle counts, sponge counts, and instrument counts were correct.  PATIENT DISPOSITION:  PACU - hemodynamically stable.   Judeth Horn 3/1/201911:32 AM

## 2017-03-05 NOTE — Anesthesia Procedure Notes (Signed)
Procedure Name: Intubation Date/Time: 03/05/2017 10:43 AM Performed by: Adalberto Ill, CRNA Pre-anesthesia Checklist: Patient identified, Emergency Drugs available, Suction available, Patient being monitored and Timeout performed Patient Re-evaluated:Patient Re-evaluated prior to induction Oxygen Delivery Method: Circle system utilized Preoxygenation: Pre-oxygenation with 100% oxygen Induction Type: IV induction Ventilation: Mask ventilation without difficulty Laryngoscope Size: Glidescope and 3 Grade View: Grade I Tube type: Oral Tube size: 7.5 mm Number of attempts: 1 Airway Equipment and Method: Stylet Placement Confirmation: ETT inserted through vocal cords under direct vision,  positive ETCO2 and breath sounds checked- equal and bilateral Secured at: 24 cm Tube secured with: Tape Dental Injury: Teeth and Oropharynx as per pre-operative assessment

## 2017-03-05 NOTE — Transfer of Care (Signed)
Immediate Anesthesia Transfer of Care Note  Patient: [name]  Procedure(s) Performed: APPENDECTOMY LAPAROSCOPIC (N/A Abdomen)  Patient Location: PACU  Anesthesia Type:General  Level of Consciousness: awake, alert  and oriented  Airway & Oxygen Therapy: Patient Spontanous Breathing and Patient connected to face mask oxygen  Post-op Assessment: Report given to RN and Post -op Vital signs reviewed and stable  Post vital signs: Reviewed and stable  Last Vitals:  Vitals:   03/05/17 0833 03/05/17 1147  BP: 106/64   Pulse: 74   Resp: 18   Temp: 36.8 C (P) 36.7 C  SpO2: 96%     Last Pain:  Vitals:   03/05/17 0833  TempSrc: Oral         Complications: No apparent anesthesia complications

## 2017-03-05 NOTE — Anesthesia Postprocedure Evaluation (Signed)
Anesthesia Post Note  Patient: [name]  Procedure(s) Performed: APPENDECTOMY LAPAROSCOPIC (N/A Abdomen)     Patient location during evaluation: PACU Anesthesia Type: General Level of consciousness: awake and alert Pain management: pain level controlled Vital Signs Assessment: post-procedure vital signs reviewed and stable Respiratory status: spontaneous breathing, nonlabored ventilation, respiratory function stable and patient connected to nasal cannula oxygen Cardiovascular status: blood pressure returned to baseline and stable Postop Assessment: no apparent nausea or vomiting Anesthetic complications: no    Last Vitals:  Vitals:   03/05/17 0833 03/05/17 1147  BP: 106/64   Pulse: 74   Resp: 18   Temp: 36.8 C 36.7 C  SpO2: 96%     Last Pain:  Vitals:   03/05/17 1315  TempSrc:   PainSc: 6                  Barnet Glasgow

## 2017-03-06 ENCOUNTER — Encounter (HOSPITAL_COMMUNITY): Payer: Self-pay | Admitting: General Surgery

## 2017-03-06 DIAGNOSIS — N189 Chronic kidney disease, unspecified: Secondary | ICD-10-CM | POA: Diagnosis not present

## 2017-03-06 DIAGNOSIS — Z7901 Long term (current) use of anticoagulants: Secondary | ICD-10-CM | POA: Diagnosis not present

## 2017-03-06 DIAGNOSIS — D121 Benign neoplasm of appendix: Secondary | ICD-10-CM | POA: Diagnosis not present

## 2017-03-06 DIAGNOSIS — E7800 Pure hypercholesterolemia, unspecified: Secondary | ICD-10-CM | POA: Diagnosis not present

## 2017-03-06 DIAGNOSIS — K219 Gastro-esophageal reflux disease without esophagitis: Secondary | ICD-10-CM | POA: Diagnosis not present

## 2017-03-06 DIAGNOSIS — Z87891 Personal history of nicotine dependence: Secondary | ICD-10-CM | POA: Diagnosis not present

## 2017-03-06 DIAGNOSIS — I130 Hypertensive heart and chronic kidney disease with heart failure and stage 1 through stage 4 chronic kidney disease, or unspecified chronic kidney disease: Secondary | ICD-10-CM | POA: Diagnosis not present

## 2017-03-06 DIAGNOSIS — Z951 Presence of aortocoronary bypass graft: Secondary | ICD-10-CM | POA: Diagnosis not present

## 2017-03-06 DIAGNOSIS — Z79899 Other long term (current) drug therapy: Secondary | ICD-10-CM | POA: Diagnosis not present

## 2017-03-06 DIAGNOSIS — M1990 Unspecified osteoarthritis, unspecified site: Secondary | ICD-10-CM | POA: Diagnosis not present

## 2017-03-06 DIAGNOSIS — I509 Heart failure, unspecified: Secondary | ICD-10-CM | POA: Diagnosis not present

## 2017-03-06 DIAGNOSIS — I2510 Atherosclerotic heart disease of native coronary artery without angina pectoris: Secondary | ICD-10-CM | POA: Diagnosis not present

## 2017-03-06 DIAGNOSIS — E1122 Type 2 diabetes mellitus with diabetic chronic kidney disease: Secondary | ICD-10-CM | POA: Diagnosis not present

## 2017-03-06 DIAGNOSIS — I739 Peripheral vascular disease, unspecified: Secondary | ICD-10-CM | POA: Diagnosis not present

## 2017-03-06 DIAGNOSIS — G4730 Sleep apnea, unspecified: Secondary | ICD-10-CM | POA: Diagnosis not present

## 2017-03-06 DIAGNOSIS — I110 Hypertensive heart disease with heart failure: Secondary | ICD-10-CM | POA: Diagnosis not present

## 2017-03-06 DIAGNOSIS — Z7984 Long term (current) use of oral hypoglycemic drugs: Secondary | ICD-10-CM | POA: Diagnosis not present

## 2017-03-06 DIAGNOSIS — I4891 Unspecified atrial fibrillation: Secondary | ICD-10-CM | POA: Diagnosis not present

## 2017-03-06 MED ORDER — SODIUM CHLORIDE 0.9 % IV BOLUS (SEPSIS)
500.0000 mL | Freq: Once | INTRAVENOUS | Status: AC
  Administered 2017-03-06: 500 mL via INTRAVENOUS

## 2017-03-06 NOTE — Progress Notes (Signed)
BP=83/48 other VSS and pt asymptomatic. Paged Dr. Grandville Silos, 500cc NS bolus ordered. Will continue to monitor pt.

## 2017-03-06 NOTE — Discharge Instructions (Signed)
[facility] ______[facility] °LAPAROSCOPIC SURGERY: POST OP INSTRUCTIONS °Always review your discharge instruction sheet given to you by the facility where your surgery was performed. °IF YOU HAVE DISABILITY OR FAMILY LEAVE FORMS, YOU MUST BRING THEM TO THE OFFICE FOR PROCESSING.   °DO NOT GIVE THEM TO YOUR DOCTOR. ° °1. A prescription for pain medication may be given to you upon discharge.  Take your pain medication as prescribed, if needed.  If narcotic pain medicine is not needed, then you may take acetaminophen (Tylenol) or ibuprofen (Advil) as needed. °2. Take your usually prescribed medications unless otherwise directed. °3. If you need a refill on your pain medication, please contact your pharmacy.  They will contact our office to request authorization. Prescriptions will not be filled after 5pm or on week-ends. °4. You should follow a light diet the first few days after arrival home, such as soup and crackers, etc.  Be sure to include lots of fluids daily. °5. Most patients will experience some swelling and bruising in the area of the incisions.  Ice packs will help.  Swelling and bruising can take several days to resolve.  °6. It is common to experience some constipation if taking pain medication after surgery.  Increasing fluid intake and taking a stool softener (such as Colace) will usually help or prevent this problem from occurring.  A mild laxative (Milk of Magnesia or Miralax) should be taken according to package instructions if there are no bowel movements after 48 hours. °7. Unless discharge instructions indicate otherwise, you may remove your bandages 24-48 hours after surgery, and you may shower at that time.  You may have steri-strips (small skin tapes) in place directly over the incision.  These strips should be left on the skin for 7-10 days.  If your surgeon used skin glue on the incision, you may shower in 24 hours.  The glue will flake off over the next 2-3 weeks.  Any sutures or  staples will be removed at the office during your follow-up visit. °8. ACTIVITIES:  You may resume regular (light) daily activities beginning the next day--such as daily self-care, walking, climbing stairs--gradually increasing activities as tolerated.  You may have sexual intercourse when it is comfortable.  Refrain from any heavy lifting or straining until approved by your doctor. °a. You may drive when you are no longer taking prescription pain medication, you can comfortably wear a seatbelt, and you can safely maneuver your car and apply brakes. °b. RETURN TO WORK:  __________________________________________________________ °9. You should see your doctor in the office for a follow-up appointment approximately 2-3 weeks after your surgery.  Make sure that you call for this appointment within a day or two after you arrive home to insure a convenient appointment time. °10. OTHER INSTRUCTIONS: __________________________________________________________________________________________________________________________ __________________________________________________________________________________________________________________________ °WHEN TO CALL YOUR DOCTOR: °1. Fever over 101.0 °2. Inability to urinate °3. Continued bleeding from incision. °4. Increased pain, redness, or drainage from the incision. °5. Increasing abdominal pain ° °The clinic staff is available to answer your questions during regular business hours.  Please don’t hesitate to call and ask to speak to one of the nurses for clinical concerns.  If you have a medical emergency, go to the nearest emergency room or call 911.  A surgeon from [facility] is always on call at the hospital. °[address], [address], [place], [place]  [address] ? [address] [address], [place], [place]   [address] °[phone] ? [phone] ? FAX [phone] °Web site:   [email or web address] °

## 2017-03-06 NOTE — Progress Notes (Signed)
Discharge home. Home discharge instructions given to patient, no question verbalized.

## 2017-03-06 NOTE — Progress Notes (Signed)
Patient ID: [name], male   DOB: December 16, 1940, 77 y.o.   MRN: [id number]  Doing well Awake and alert No complaints Abdomen soft Lungs clear CV RRR  Plan: D/c home

## 2017-03-06 NOTE — Discharge Summary (Signed)
Physician Discharge Summary  Patient ID: [name] MRN: [id number] DOB/AGE: 04-12-1940 77 y.o.  Admit date: 03/05/2017 Discharge date: 03/06/2017  Admission Diagnoses:  Discharge Diagnoses:  Active Problems:   Adenoma of appendix   Discharged Condition: good  Hospital Course: uneventful post op recovery.  Discharged home POD#1  Consults: None  Significant Diagnostic Studies:   Treatments: surgery: laparoscopic appendectomy  Discharge Exam: Blood pressure (!) 103/56, pulse 67, temperature 98.8 F (37.1 C), temperature source Oral, resp. rate 18, height _0  (1.778 m), weight 85.7 kg (188 lb 15 oz), SpO2 97 %. General appearance: alert, cooperative and no distress Resp: clear to auscultation bilaterally Cardio: regular rate and rhythm, S1, S2 normal, no murmur, click, rub or gallop Incision/Wound: abdomen soft, incisions clean  Disposition: 01-Home or Self Care   Allergies as of 03/06/2017      Reactions   Sunflower Seed [sunflower Oil] Swelling, Other (See Comments)   Tongue and lip swelling   Horse-derived Products Other (See Comments)   Per allergy skin test UNSPECIFIED REACTION    Tetanus Toxoids Other (See Comments)   Per allergy skin test   Tetanus Toxoid    Other reaction(s): Other (See Comments) Rash(horse serum)      Medication List    TAKE these medications   ACCU-CHEK AVIVA PLUS test strip Generic drug:  glucose blood CHECK BLOOD GLUCOSE 3 TIMES DAILY.   ACCU-CHEK AVIVA PLUS w/Device Kit Check blood sugar 1 time  daily   acetaminophen 500 MG tablet Commonly known as:  TYLENOL Take 1,000 mg by mouth every 6 (six) hours as needed for moderate pain or headache.   allopurinol 300 MG tablet Commonly known as:  ZYLOPRIM Take 1 tablet daily to prevent gout What changed:    how much to take  how to take this  when to take this  additional instructions   ALPRAZolam 1 MG tablet Commonly known as:  XANAX Take 0.5 tablets (0.5 mg total) by  mouth at bedtime as needed for sleep.   ARTIFICIAL TEARS 1.4 % ophthalmic solution Generic drug:  polyvinyl alcohol Place 1 drop into both eyes daily as needed for dry eyes.   aspirin EC 81 MG tablet Take 81 mg by mouth daily.   atorvastatin 40 MG tablet Commonly known as:  LIPITOR Take 20 mg by mouth every Monday, Wednesday, and Friday.   azelastine 0.1 % nasal spray Commonly known as:  ASTELIN Place 2 sprays into both nostrils 2 (two) times daily. Use in each nostril as directed What changed:    when to take this  reasons to take this  additional instructions   bacitracin 500 UNIT/GM ointment Apply 1 application topically daily as needed for wound care.   carvedilol 6.25 MG tablet Commonly known as:  COREG TAKE 1 TABLET BY MOUTH TWO  TIMES DAILY What changed:    how much to take  how to take this  when to take this   CLARITIN 10 MG tablet Generic drug:  loratadine Take 10 mg by mouth daily as needed for allergies.   colchicine 0.6 MG tablet Take 0.6 mg by mouth daily as needed (for gout flare up).   diphenhydrAMINE 25 MG tablet Commonly known as:  BENADRYL Take 25 mg by mouth at bedtime as needed for allergies.   ferrous sulfate 325 (65 FE) MG tablet Take 325 mg by mouth 2 (two) times daily with a meal.   fluticasone 50 MCG/ACT nasal spray Commonly known as:  FLONASE Place 1  spray into both nostrils daily as needed for allergies.   GAVISCON PO Take 4 tablets by mouth daily as needed (acid reflux).   glipiZIDE 10 MG tablet Commonly known as:  GLUCOTROL TAKE 1 TABLET 3 TIMES DAILY BEFORE MEALS What changed:    how much to take  how to take this  when to take this  additional instructions   HYDROcodone-acetaminophen 5-325 MG tablet Commonly known as:  NORCO/VICODIN Take 1-2 tablets by mouth every 6 (six) hours as needed for moderate pain.   lidocaine 5 % Commonly known as:  LIDODERM Place 1 patch onto the skin daily as needed (for pain).  Remove & Discard patch within 12 hours or as directed by MD   metFORMIN 500 MG 24 hr tablet Commonly known as:  GLUCOPHAGE-XR TAKE 2 TABLETS BY MOUTH TWO TIMES DAILY What changed:  See the new instructions.   metolazone 5 MG tablet Commonly known as:  ZAROXOLYN Take 5 mg by mouth daily as needed (weight gain and edema).   multivitamin with minerals Tabs tablet Take 1 tablet by mouth at bedtime.   PEG-KCl-NaCl-NaSulf-Na Asc-C 140 g Solr Commonly known as:  PLENVU Take 140 g by mouth as directed.   potassium chloride SA 20 MEQ tablet Commonly known as:  K-DUR,KLOR-CON Take 1 tablet (20 mEq total) by mouth 2 (two) times daily. What changed:  when to take this   [place] into the lungs at bedtime. CPAP   PRESERVISION AREDS 2 Caps Take 1 capsule by mouth 2 (two) times daily.   ranitidine 150 MG tablet Commonly known as:  ZANTAC Take 150 mg by mouth daily as needed for heartburn.   sertraline 100 MG tablet Commonly known as:  ZOLOFT TAKE 1 TABLET BY MOUTH  DAILY   torsemide 100 MG tablet Commonly known as:  DEMADEX Take 100 mg in AM and 50 mg in PM if needed for weight gain What changed:    how much to take  how to take this  when to take this  additional instructions   valsartan 320 MG tablet Commonly known as:  DIOVAN Take 0.5 tablets (160 mg total) by mouth daily.   VITAMIN B COMPLEX PO Take 1 tablet by mouth at bedtime.   Vitamin D-3 5000 units Tabs Take 5,000 Units by mouth at bedtime.   warfarin 5 MG tablet Commonly known as:  COUMADIN Take 7.5-10 mg by mouth See admin instructions. Take 7.5 mg by mouth daily on Sunday. Take 10 mg by mouth daily on all other days        Signed: Delvin Hedeen A 03/06/2017, 8:24 AM

## 2017-03-18 ENCOUNTER — Ambulatory Visit (HOSPITAL_COMMUNITY)
Admission: RE | Admit: 2017-03-18 | Discharge: 2017-03-18 | Disposition: A | Discharge status: Home / Self Care | Payer: Medicare Other | Source: Ambulatory Visit | Attending: Internal Medicine | Admitting: Internal Medicine

## 2017-03-18 VITALS — BP 115/68 | HR 70 | Ht 70.0 in | Wt 185.8 lb

## 2017-03-18 DIAGNOSIS — J449 Chronic obstructive pulmonary disease, unspecified: Secondary | ICD-10-CM | POA: Diagnosis not present

## 2017-03-18 DIAGNOSIS — L03119 Cellulitis of unspecified part of limb: Secondary | ICD-10-CM | POA: Diagnosis not present

## 2017-03-18 DIAGNOSIS — Z7901 Long term (current) use of anticoagulants: Secondary | ICD-10-CM | POA: Insufficient documentation

## 2017-03-18 DIAGNOSIS — J984 Other disorders of lung: Secondary | ICD-10-CM | POA: Diagnosis not present

## 2017-03-18 DIAGNOSIS — I130 Hypertensive heart and chronic kidney disease with heart failure and stage 1 through stage 4 chronic kidney disease, or unspecified chronic kidney disease: Secondary | ICD-10-CM | POA: Diagnosis not present

## 2017-03-18 DIAGNOSIS — I5032 Chronic diastolic (congestive) heart failure: Secondary | ICD-10-CM | POA: Diagnosis not present

## 2017-03-18 DIAGNOSIS — I480 Paroxysmal atrial fibrillation: Secondary | ICD-10-CM | POA: Diagnosis not present

## 2017-03-18 DIAGNOSIS — J90 Pleural effusion, not elsewhere classified: Secondary | ICD-10-CM | POA: Diagnosis not present

## 2017-03-18 DIAGNOSIS — M109 Gout, unspecified: Secondary | ICD-10-CM | POA: Insufficient documentation

## 2017-03-18 DIAGNOSIS — K219 Gastro-esophageal reflux disease without esophagitis: Secondary | ICD-10-CM | POA: Diagnosis not present

## 2017-03-18 DIAGNOSIS — Z7984 Long term (current) use of oral hypoglycemic drugs: Secondary | ICD-10-CM | POA: Insufficient documentation

## 2017-03-18 DIAGNOSIS — E1122 Type 2 diabetes mellitus with diabetic chronic kidney disease: Secondary | ICD-10-CM | POA: Insufficient documentation

## 2017-03-18 DIAGNOSIS — F329 Major depressive disorder, single episode, unspecified: Secondary | ICD-10-CM | POA: Diagnosis not present

## 2017-03-18 DIAGNOSIS — Z79899 Other long term (current) drug therapy: Secondary | ICD-10-CM | POA: Insufficient documentation

## 2017-03-18 DIAGNOSIS — Z951 Presence of aortocoronary bypass graft: Secondary | ICD-10-CM | POA: Insufficient documentation

## 2017-03-18 DIAGNOSIS — G4733 Obstructive sleep apnea (adult) (pediatric): Secondary | ICD-10-CM | POA: Diagnosis not present

## 2017-03-18 DIAGNOSIS — F419 Anxiety disorder, unspecified: Secondary | ICD-10-CM | POA: Diagnosis not present

## 2017-03-18 DIAGNOSIS — Z9889 Other specified postprocedural states: Secondary | ICD-10-CM | POA: Insufficient documentation

## 2017-03-18 DIAGNOSIS — M1990 Unspecified osteoarthritis, unspecified site: Secondary | ICD-10-CM | POA: Diagnosis not present

## 2017-03-18 DIAGNOSIS — I484 Atypical atrial flutter: Secondary | ICD-10-CM | POA: Diagnosis not present

## 2017-03-18 DIAGNOSIS — D649 Anemia, unspecified: Secondary | ICD-10-CM | POA: Diagnosis not present

## 2017-03-18 DIAGNOSIS — I2510 Atherosclerotic heart disease of native coronary artery without angina pectoris: Secondary | ICD-10-CM | POA: Diagnosis not present

## 2017-03-18 DIAGNOSIS — I4510 Unspecified right bundle-branch block: Secondary | ICD-10-CM | POA: Insufficient documentation

## 2017-03-18 DIAGNOSIS — Z8371 Family history of colonic polyps: Secondary | ICD-10-CM | POA: Insufficient documentation

## 2017-03-18 DIAGNOSIS — E785 Hyperlipidemia, unspecified: Secondary | ICD-10-CM | POA: Insufficient documentation

## 2017-03-18 NOTE — Progress Notes (Signed)
Patient ID: DAISHON CHUI, male   DOB: 04/25/1940, 77 y.o.   MRN: [id number]   Advanced Heart Failure Clinic Note   Patient ID: AAIDEN DEPOY, male   DOB: June 17, 1940, 77 y.o.   MRN: [id number]  Primary Cardiologist: [name]   Subjective: Tauren is a 77 y/o male with COPD , DM, PAF, 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, CKD, AFL s/p ablation in 10/15. 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 Had surgery in 4/15. Did well for a while went to cardiac rehab and was feeling fine.  Developed AFL in 10/15 and underwent RFA.   In 3/16 began to develop severe SOB. Started O2. Says his symptoms got worse almost overnight. 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 He has seen Dr. Roxy Manns but felt to have no optimal surgical options for repair. His case was also presented to Dr. Michaelle Birks at [facility] who agreed that there was no way to baffle or reroute the anomalous PV flow to the LA. He had a TEE which showed LVEF 60-65% with a dilated right side and a small PFO. He has also been seen by Dr. Lake Bells who performed PFTs that showed significant restrictive lung disease with a low DLCO. He had f/u with Dr. Gilles Chiquito in the [facility] who felt his symptoms were multifactorial.  Admitted 12/17 for gout and LE cellulitis   Today he returns for HF follow up. Recently had colonoscopy and found to have a polyp and what sounds like AVMs. Subsequently had a laparoscopic removal of appendiceal adenoma which was benign. Feeling good. No swelling, orthopnea or PND. Taking torsemide 100 daily. Takes metolazone on occasion. Active in yard. Weight went down after surgery now back up.   Echo 12/18 LVEF 60% RV dilated mildly HK  Echo 12/17 LVEF 55-60% RV moderately dilated mild HK. RVSP 37mHG   PFTs (7/16) FEV1 1.45 L (45%) FVC 1.77 L (40%) DLCO 46%  RHC 4/16 RA = 18 RV = 72/4/17 PA = 76/27  (46) PCW = 21 Fick cardiac output/index (using PA sat) = 9.2/4.45 Thermo CO/CI = 10.0/4.87 PVR = 2.2 WU Fick cardiac output/index (using high SVC sat) = 5.2/2.5 Pulse-ox saturation = 89%  High SVC sat = 54% Low SVC sat = 81% (at SVC/RA junction) RA sat = 68% RV sat = 66% PA sat = 68%, 69% IVC sat =56%   ECGO 1/17: EF 60%. RV dilated with moderately reduced systolic function. RVSP 70. + bubble.  VQ/CT negative for PE   TEE 10/15 small PFO  Ab u/s 6/16 liver normal + ascites. Medico renal kidney disease.  Past Medical History:  Diagnosis Date  . Adrenal adenoma   . Anemia   . Anxiety   . Arthritis   . Atypical atrial flutter ([place] 8/15, 10/15   a. DCCV 08/2013. b. s/p RFA 10/2013.  .Marland KitchenBasal cell carcinoma   . CAD (coronary artery disease)    a. 04/2013 CABG x 2: LIMA to LAD, SVG to RI, EVH via R thigh.  . Cellulitis 12/2015   left leg  . Chronic diastolic congestive heart failure ([place]   . CKD (chronic kidney disease), stage III ([place]   . Depression   . Diabetes mellitus type II   . Diverticulosis 2001  . DJD (degenerative joint disease)   . GERD (gastroesophageal reflux disease)   .  Gout   . H/O hiatal hernia   . History of cardioversion    x3 (years uncertain)  . Hx of adenomatous colonic polyps   . Hyperlipidemia   . Hypertension   . Hypertensive cardiomyopathy ([place])   . Obstructive sleep apnea    compliant with CPAP  . 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 Persistent atrial fibrillation ([place])    a. s/p MAZE 04/2013 in setting of CABG. b. Amio stopped in 10/2013 after flutter ablation.  [name] PFO (patent foramen ovale)    a. Small PFO by TEE 10/2013.  [name] Pleural effusion, left    a. s/p thoracentesis 05/2013.  [name] Respiratory failure ([place])    a. Hypoxia 10/2013 - required supp O2 as inpatient, did not require it at discharge.  . S/P Maze operation for  atrial fibrillation    a. 04/2013: Complete bilateral atrial lesion set using cryothermy and bipolar radiofrequency ablation with clipping of LA appendage (@ time of CABG)  . S/P Maze operation for atrial fibrillation 04/05/2013   Complete bilateral atrial lesion set using cryothermy and bipolar radiofrequency ablation with clipping of LA appendage via median sternotomy approach     Current Outpatient Medications  Medication Sig Dispense Refill  . ACCU-CHEK AVIVA PLUS test strip CHECK BLOOD GLUCOSE 3 TIMES DAILY. 300 each 1  . acetaminophen (TYLENOL) 500 MG tablet Take 1,000 mg by mouth every 6 (six) hours as needed for moderate pain or headache.     . allopurinol (ZYLOPRIM) 300 MG tablet Take 1 tablet daily to prevent gout (Patient taking differently: Take 300 mg by mouth daily. ) 90 tablet 1  . ALPRAZolam (XANAX) 1 MG tablet Take 0.5 tablets (0.5 mg total) by mouth at bedtime as needed for sleep. 60 tablet 0  . Alum Hydroxide-Mag Carbonate (GAVISCON PO) Take 4 tablets by mouth daily as needed (acid reflux).     [name] aspirin EC 81 MG tablet Take 81 mg by mouth daily.    [name] atorvastatin (LIPITOR) 40 MG tablet Take 20 mg by mouth every Monday, Wednesday, and Friday.     [name] azelastine (ASTELIN) 0.1 % nasal spray Place 2 sprays into both nostrils 2 (two) times daily. Use in each nostril as directed (Patient taking differently: Place 2 sprays into both nostrils 2 (two) times daily as needed for rhinitis or allergies. Use in each nostril as directed) 30 mL 2  . B Complex Vitamins (VITAMIN B COMPLEX PO) Take 1 tablet by mouth at bedtime.     . bacitracin 500 UNIT/GM ointment Apply 1 application topically daily as needed for wound care.    . Blood Glucose Monitoring Suppl (ACCU-CHEK AVIVA PLUS) w/Device KIT Check blood sugar 1 time  daily 1 kit 0  . carvedilol (COREG) 6.25 MG tablet TAKE 1 TABLET BY MOUTH TWO  TIMES DAILY (Patient taking differently: TAKE 6.25 MG BY MOUTH TWO  TIMES DAILY) 180 tablet 3  .  Cholecalciferol (VITAMIN D-3) 5000 units TABS Take 5,000 Units by mouth at bedtime.    . colchicine 0.6 MG tablet Take 0.6 mg by mouth daily as needed (for gout flare up).     . diphenhydrAMINE (BENADRYL) 25 MG tablet Take 25 mg by mouth at bedtime as needed for allergies.     . ferrous sulfate 325 (65 FE) MG tablet Take 325 mg by mouth 2 (two) times daily with a meal.    .  fluticasone (FLONASE) 50 MCG/ACT nasal spray Place 1 spray into both nostrils daily as needed for allergies.     [name] glipiZIDE (GLUCOTROL) 10 MG tablet TAKE 1 TABLET 3 TIMES DAILY BEFORE MEALS (Patient taking differently: Take 10 mg by mouth 3 (three) times daily before meals. ) 270 tablet 1  . HYDROcodone-acetaminophen (NORCO/VICODIN) 5-325 MG tablet Take 1-2 tablets by mouth every 6 (six) hours as needed for moderate pain.     [name] lidocaine (LIDODERM) 5 % Place 1 patch onto the skin daily as needed (for pain). Remove & Discard patch within 12 hours or as directed by MD     . loratadine (CLARITIN) 10 MG tablet Take 10 mg by mouth daily as needed for allergies.     . metFORMIN (GLUCOPHAGE-XR) 500 MG 24 hr tablet TAKE 2 TABLETS BY MOUTH TWO TIMES DAILY (Patient taking differently: TAKE 1000 MG BY MOUTH TWO TIMES DAILY) 360 tablet 1  . Multiple Vitamin (MULTIVITAMIN WITH MINERALS) TABS tablet Take 1 tablet by mouth at bedtime.     . Multiple Vitamins-Minerals (PRESERVISION AREDS 2) CAPS Take 1 capsule by mouth 2 (two) times daily.     [name] PEG-KCl-NaCl-NaSulf-Na Asc-C (PLENVU) 140 g SOLR Take 140 g by mouth as directed. 1 each 0  . polyvinyl alcohol (ARTIFICIAL TEARS) 1.4 % ophthalmic solution Place 1 drop into both eyes daily as needed for dry eyes.    . potassium chloride SA (K-DUR,KLOR-CON) 20 MEQ tablet Take 20 mEq by mouth 3 (three) times daily.    [name] PRESCRIPTION MEDICATION Inhale into the lungs at bedtime. CPAP    . ranitidine (ZANTAC) 150 MG tablet Take 150 mg by mouth daily as needed for heartburn.     . sertraline (ZOLOFT) 100 MG  tablet TAKE 1 TABLET BY MOUTH  DAILY 90 tablet 1  . torsemide (DEMADEX) 100 MG tablet Take 100 mg in AM and 50 mg in PM if needed for weight gain (Patient taking differently: Take 50-100 mg by mouth See admin instructions. TAKE 1 TABLET (100 MG) BY MOUTH SCHEDULED IN THE MORNING, AND MAY TAKE 0.5 TABLET (50 MG) IN THE EVENING IF NEEDED FOR SWELLING/WEIGHT GAIN) 180 tablet 3  . valsartan (DIOVAN) 320 MG tablet Take 0.5 tablets (160 mg total) by mouth daily. 45 tablet 3  . warfarin (COUMADIN) 5 MG tablet Take 7.5-10 mg by mouth See admin instructions. Take 7.5 mg by mouth daily on Sunday. Take 10 mg by mouth daily on all other days    . metolazone (ZAROXOLYN) 5 MG tablet Take 5 mg by mouth daily as needed (weight gain and edema). (Patient not taking: Reported on 03/18/2017) 20 tablet 0   No current facility-administered medications for this encounter.     Allergies  Allergen Reactions  . Sunflower Seed [Sunflower Oil] Swelling and Other (See Comments)    Tongue and lip swelling  . Horse-Derived Products Other (See Comments)    Per allergy skin test UNSPECIFIED REACTION   . Tetanus Toxoids Other (See Comments)    Per allergy skin test  . Tetanus Toxoid     Other reaction(s): Other (See Comments) Rash(horse serum)      Social History   Socioeconomic History  . Marital status: Married    Spouse name: Not on file  . Number of children: 1  . Years of education: Not on file  . Highest education level: Not on file  Social Needs  . Financial resource strain: Not on file  . Food insecurity - worry:  Not on file  . Food insecurity - inability: Not on file  . Transportation needs - medical: Not on file  . Transportation needs - non-medical: Not on file  Occupational History  . Occupation: retired Software engineer  Tobacco Use  . Smoking status: Former Smoker    Packs/day: 4.00    Years: 25.00    Pack years: 100.00    Types: Cigarettes    Last attempt to quit: 01/05/1981    Years since  quitting: 36.2  . Smokeless tobacco: Never Used  Substance and Sexual Activity  . Alcohol use: Yes    Alcohol/week: 0.6 oz    Types: 1 Shots of liquor per week    Comment: 1-5 drinks per week  . Drug use: No  . Sexual activity: Not on file  Other Topics Concern  . Not on file  Social History Narrative   Daily caffeine-yes   Patient gets regular exercise.   Pt lives in [place] with spouse.  Retired Software engineer.  Family History  Problem Relation Age of Onset  . Dementia Father   . Colon cancer Mother        Family History/Uncle   . Colon polyps Mother        Family History  . Atrial fibrillation Mother   . Hypertension Mother   . Colon polyps Sister        Family history  . Diabetes Maternal Uncle   . Stroke Paternal Uncle     Vitals:   03/18/17 0955  BP: 115/68  Pulse: 70  SpO2: 100%  Weight: 185 lb  12.8 oz (84.3 kg)  Height: 5' 10"  (1.778 m)   Wt Readings from Last 3 Encounters:  03/18/17 185 lb 12.8 oz (84.3 kg)  03/05/17 188 lb 15 oz (85.7 kg)  03/01/17 189 lb 12.8 oz (86.1 kg)     PHYSICAL EXAM: General:  Well appearing. No resp difficulty HEENT: normal Neck: supple. JVP 7. Carotids 2+ bilat; no bruits. No lymphadenopathy or thryomegaly appreciated. Cor: PMI nondisplaced. Regular rate & rhythm. No rubs, gallops or murmurs. Lungs: clear Abdomen: soft, nontender, nondistended. No hepatosplenomegaly. No bruits or masses. Good bowel sounds. Extremities: no cyanosis, clubbing, rash, edema Neuro: alert & orientedx3, cranial nerves grossly intact. moves all 4 extremities w/o difficulty. Affect pleasant   ASSESSMENT  1. Chronic diastolic HF with R>>L symptoms - NYHA II - Volume status looks good.  - Continue torsemide 100 mg daily. Continu metolazone as needed.  - Check labs today 2. RV failure & Pulmonary HTN - Has large left to right shunt through large anomalous pulmonary vein into SVC - Have reviewed with TCTS and Dr. Michaelle Birks at [facility]. No way to repair or baffle. Continue medical therapy 3. CAD s/p CABG - No s/s ischemia - Continue statin. Off ASA due to warfarin 4. AF s/p Maze  - In NSR today.   - On coumadin. Has been on DOAC in past and didn't like it because of bleeding 5. AFL s/p ablation 10/15 6. CKD, stage 3, baseline creatinine 1.7-1.8 - Recent check 1.6 7. Anemia - Up from previous. Now 12.6 - s/p recent colon/EGD. Followed by GI 8. RBBB - Stable   Glori Bickers, MD  10:18 AM

## 2017-03-18 NOTE — Patient Instructions (Signed)
We will contact you in 6 months to schedule your next appointment.  

## 2017-03-18 NOTE — Addendum Note (Signed)
Encounter addended by: Scarlette Calico, RN on: 03/18/2017 10:27 AM  Actions taken: Sign clinical note

## 2017-03-21 ENCOUNTER — Other Ambulatory Visit: Payer: Self-pay | Admitting: Internal Medicine

## 2017-03-28 DIAGNOSIS — J019 Acute sinusitis, unspecified: Secondary | ICD-10-CM | POA: Diagnosis not present

## 2017-03-28 DIAGNOSIS — R51 Headache: Secondary | ICD-10-CM | POA: Diagnosis not present

## 2017-03-29 ENCOUNTER — Other Ambulatory Visit: Payer: Self-pay | Admitting: *Deleted

## 2017-03-29 MED ORDER — AZELASTINE HCL 0.1 % NA SOLN: 2.0000 | Freq: Two times a day (BID) | NASAL | 2 refills | Status: DC

## 2017-04-20 DIAGNOSIS — M1990 Unspecified osteoarthritis, unspecified site: Secondary | ICD-10-CM | POA: Diagnosis not present

## 2017-04-20 DIAGNOSIS — E790 Hyperuricemia without signs of inflammatory arthritis and tophaceous disease: Secondary | ICD-10-CM | POA: Diagnosis not present

## 2017-04-20 DIAGNOSIS — M79643 Pain in unspecified hand: Secondary | ICD-10-CM | POA: Diagnosis not present

## 2017-04-20 DIAGNOSIS — M109 Gout, unspecified: Secondary | ICD-10-CM | POA: Diagnosis not present

## 2017-04-20 DIAGNOSIS — N189 Chronic kidney disease, unspecified: Secondary | ICD-10-CM | POA: Diagnosis not present

## 2017-04-21 ENCOUNTER — Ambulatory Visit (INDEPENDENT_AMBULATORY_CARE_PROVIDER_SITE_OTHER): Payer: Medicare Other | Admitting: Internal Medicine

## 2017-04-21 VITALS — BP 118/64 | HR 64 | Temp 97.5°F | Resp 16 | Ht 71.0 in | Wt 184.2 lb

## 2017-04-21 DIAGNOSIS — N183 Chronic kidney disease, stage 3 (moderate): Secondary | ICD-10-CM | POA: Diagnosis not present

## 2017-04-21 DIAGNOSIS — N401 Enlarged prostate with lower urinary tract symptoms: Secondary | ICD-10-CM | POA: Diagnosis not present

## 2017-04-21 DIAGNOSIS — Z136 Encounter for screening for cardiovascular disorders: Secondary | ICD-10-CM

## 2017-04-21 DIAGNOSIS — I480 Paroxysmal atrial fibrillation: Secondary | ICD-10-CM

## 2017-04-21 DIAGNOSIS — N138 Other obstructive and reflux uropathy: Secondary | ICD-10-CM

## 2017-04-21 DIAGNOSIS — E559 Vitamin D deficiency, unspecified: Secondary | ICD-10-CM | POA: Diagnosis not present

## 2017-04-21 DIAGNOSIS — I2510 Atherosclerotic heart disease of native coronary artery without angina pectoris: Secondary | ICD-10-CM

## 2017-04-21 DIAGNOSIS — I10 Essential (primary) hypertension: Secondary | ICD-10-CM

## 2017-04-21 DIAGNOSIS — Z87891 Personal history of nicotine dependence: Secondary | ICD-10-CM

## 2017-04-21 DIAGNOSIS — E1122 Type 2 diabetes mellitus with diabetic chronic kidney disease: Secondary | ICD-10-CM

## 2017-04-21 DIAGNOSIS — E782 Mixed hyperlipidemia: Secondary | ICD-10-CM

## 2017-04-21 DIAGNOSIS — Z0000 Encounter for general adult medical examination without abnormal findings: Secondary | ICD-10-CM | POA: Diagnosis not present

## 2017-04-21 DIAGNOSIS — I5032 Chronic diastolic (congestive) heart failure: Secondary | ICD-10-CM

## 2017-04-21 DIAGNOSIS — Z7901 Long term (current) use of anticoagulants: Secondary | ICD-10-CM

## 2017-04-21 DIAGNOSIS — Z8249 Family history of ischemic heart disease and other diseases of the circulatory system: Secondary | ICD-10-CM

## 2017-04-21 DIAGNOSIS — Z0001 Encounter for general adult medical examination with abnormal findings: Secondary | ICD-10-CM

## 2017-04-21 DIAGNOSIS — Z5181 Encounter for therapeutic drug level monitoring: Secondary | ICD-10-CM

## 2017-04-21 DIAGNOSIS — M109 Gout, unspecified: Secondary | ICD-10-CM

## 2017-04-21 DIAGNOSIS — Z79899 Other long term (current) drug therapy: Secondary | ICD-10-CM

## 2017-04-21 NOTE — Patient Instructions (Signed)

## 2017-04-21 NOTE — Progress Notes (Signed)
[facility]   [name], M.D.     Uvaldo Bristle. Silverio Lay, P.A.-C Liane Comber, [place]                [address] [place], [place] [address] Telephone [phone] Telefax [phone] Annual  Screening/Preventative Visit  & Comprehensive Evaluation & Examination     This very nice 77 y.o. MWM presents for a Screening/Preventative Visit & comprehensive evaluation and management of multiple medical co-morbidities.  Patient has been followed for HTN, HLD, T2_NIDDM  Prediabetes and Vitamin D Deficiency. Patient is on CPAP for OSA  ~ 8 hr /nite reporting improved restorative sleep. He also has hx/o Gout on Allopurinol .      HTN predates since 76. Patient's BP has been controlled at home.  Today's BP is at goal - 118/64. Patient underwent CABG & MAZE procedure in 2015 and had RFA for Afib/flutter (on Coumadin). 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 Patient denies any cardiac symptoms as chest pain, palpitations, shortness of breath, dizziness or ankle swelling.     Patient's hyperlipidemia is controlled with diet and medications. Patient denies myalgias or other medication SE's. 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 Diabetic control has been gradually worsening and current A1c is not at goal and patient is aware of impending need to switch from Metformin to insulin: Lab Results  Component Value Date   HGBA1C 10.7 (H) 04/21/2017       Finally, patient has history of Vitamin D Deficiency  ("39"/2008)  and last vitamin D was near goal: Lab Results  Component Value Date   VD25OH 55 04/21/2017   Current Outpatient Medications on File Prior to Visit  Medication Sig  . ACCU-CHEK AVIVA PLUS test strip CHECK BLOOD GLUCOSE 3 TIMES DAILY.  [name] acetaminophen (TYLENOL) 500 MG tablet Take 1,000 mg by mouth every 6 (six) hours as needed for moderate pain or headache.   . ALPRAZolam (XANAX) 1 MG tablet Take 0.5 tablets (0.5 mg total) by mouth at bedtime as needed for sleep.  [name] Alum Hydroxide-Mag Carbonate (GAVISCON PO) Take 4 tablets by mouth daily as needed (acid reflux).   [name] aspirin EC 81 MG tablet Take 81 mg by mouth daily.  [name] atorvastatin (LIPITOR) 40 MG tablet Take 20 mg by mouth every Monday, Wednesday, and Friday.   . B Complex Vitamins (VITAMIN B COMPLEX PO) Take 1 tablet by mouth at bedtime.   . bacitracin 500 UNIT/GM ointment Apply 1 application topically daily as needed for wound care.  . Blood Glucose Monitoring Suppl (ACCU-CHEK AVIVA PLUS) w/Device KIT Check blood sugar 1 time  daily  . carvedilol (COREG) 6.25 MG tablet TAKE 1 TABLET BY MOUTH TWO  TIMES DAILY (Patient taking differently: TAKE 6.25 MG BY MOUTH TWO  TIMES DAILY)  . Cholecalciferol (VITAMIN D-3) 5000 units TABS Take 5,000 Units by mouth at bedtime.  . colchicine 0.6 MG tablet Take 0.6 mg by mouth  daily as needed (for gout flare up).   . diphenhydrAMINE (BENADRYL) 25 MG tablet Take 25 mg by mouth at bedtime as needed for allergies.   . ferrous sulfate 325 (65 FE) MG tablet Take 325 mg by mouth 2 (two) times daily with a meal.  . fluticasone (FLONASE) 50 MCG/ACT nasal spray Place 1 spray into both nostrils daily as needed for allergies.   [name] glipiZIDE (GLUCOTROL) 10 MG tablet TAKE 1 TABLET 3 TIMES DAILY BEFORE MEALS (Patient taking differently: Take 10 mg by mouth 3 (three) times daily before meals. )  . HYDROcodone-acetaminophen (NORCO/VICODIN) 5-325 MG tablet Take 1-2 tablets by mouth every 6 (six) hours as needed for  moderate pain.   [name] lidocaine (LIDODERM) 5 % Place 1 patch onto the skin daily as needed (for pain). Remove & Discard patch within 12 hours or as directed by MD   . loratadine (CLARITIN) 10 MG tablet Take 10 mg by mouth daily as needed for allergies.   . metFORMIN (GLUCOPHAGE-XR) 500 MG 24 hr tablet TAKE 2 TABLETS BY MOUTH TWO TIMES DAILY  . metolazone (ZAROXOLYN) 5 MG tablet Take 5 mg by mouth daily as needed (weight gain and edema).  . Multiple Vitamin (MULTIVITAMIN WITH MINERALS) TABS tablet Take 1 tablet by mouth at bedtime.   . Multiple Vitamins-Minerals (PRESERVISION AREDS 2) CAPS Take 1 capsule by mouth 2 (two) times daily.   . polyvinyl alcohol (ARTIFICIAL TEARS) 1.4 % ophthalmic solution Place 1 drop into both eyes daily as needed for dry eyes.  . potassium chloride SA (K-DUR,KLOR-CON) 20 MEQ tablet Take 20 mEq by mouth 3 (three) times daily.  [name] PRESCRIPTION MEDICATION Inhale into the lungs at bedtime. CPAP  . ranitidine (ZANTAC) 150 MG tablet Take 150 mg by mouth daily as needed for heartburn.   . sertraline (ZOLOFT) 100 MG tablet TAKE 1 TABLET BY MOUTH  DAILY  . torsemide (DEMADEX) 100 MG tablet Take 100 mg in AM and 50 mg in PM if needed for weight gain (Patient taking differently: Take 50-100 mg by mouth See admin instructions. TAKE 1 TABLET (100 MG) BY MOUTH SCHEDULED IN THE MORNING, AND MAY TAKE 0.5 TABLET (50 MG) IN THE EVENING IF NEEDED FOR SWELLING/WEIGHT GAIN)  . valsartan (DIOVAN) 320 MG tablet Take 0.5 tablets (160 mg total) by mouth daily.  [name] warfarin (COUMADIN) 5 MG tablet Take 7.5-10 mg by mouth See admin instructions. Take 7.5 mg by mouth daily on Sunday. Take 10 mg by mouth daily on all other days  . allopurinol (ZYLOPRIM) 300 MG tablet Take 1 tablet daily to prevent gout (Patient taking differently: Take 300 mg by mouth daily. )   No current facility-administered medications on file prior to visit.    Allergies  Allergen Reactions  . Sunflower Seed [Sunflower Oil]  Swelling and Other (See Comments)    Tongue and lip swelling  . Horse-Derived Products Other (See Comments)    Per allergy skin test UNSPECIFIED REACTION   . Tetanus Toxoids Other (See Comments)    Per allergy skin test  . Tetanus Toxoid     Other reaction(s): Other (See Comments) Rash(horse serum)   Past Medical History:  Diagnosis Date  . Adrenal adenoma   . Anemia   . Anxiety   . Arthritis   . Atypical atrial flutter ([place]) 8/15, 10/15   a. DCCV 08/2013. b. s/p RFA 10/2013.  [name] Basal cell carcinoma   . CAD (coronary artery disease)    a. 04/2013 CABG x 2:  LIMA to LAD, SVG to RI, EVH via R thigh.  . Cellulitis 12/2015   left leg  . Chronic diastolic congestive heart failure ([place])   . CKD (chronic kidney disease), stage III ([place])   . Depression   . Diabetes mellitus type II   . Diverticulosis 2001  . DJD (degenerative joint disease)   . GERD (gastroesophageal reflux disease)   . Gout   . H/O hiatal hernia   . History of cardioversion    x3 (years uncertain)  . Hx of adenomatous colonic polyps   . Hyperlipidemia   . Hypertension   . Hypertensive cardiomyopathy ([place])   . Obstructive sleep apnea    compliant with CPAP  . 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 Persistent atrial fibrillation ([place])    a. s/p MAZE 04/2013 in setting of CABG. b. Amio stopped in 10/2013 after flutter ablation.  [name] PFO (patent foramen ovale)    a. Small PFO by TEE 10/2013.  [name] Pleural effusion, left    a. s/p thoracentesis 05/2013.  [name] Respiratory failure ([place])    a. Hypoxia 10/2013 - required supp O2 as inpatient, did not require it at discharge.  . S/P Maze operation for atrial fibrillation    a. 04/2013: Complete bilateral atrial lesion set using cryothermy and bipolar radiofrequency ablation with clipping of LA appendage (@ time of CABG)  . S/P Maze operation for atrial fibrillation  04/05/2013   Complete bilateral atrial lesion set using cryothermy and bipolar radiofrequency ablation with clipping of LA appendage via median sternotomy approach    Health Maintenance  Topic Date Due  . INFLUENZA VACCINE  08/05/2017  . HEMOGLOBIN A1C  10/21/2017  . OPHTHALMOLOGY EXAM  02/04/2018  . FOOT EXAM  04/22/2018  . TETANUS/TDAP  07/05/2025  . PNA vac Low Risk Adult  Completed   Immunization History  Administered Date(s) Administered  . DT 08/05/2015  . Influenza Split 10/25/2012  . Influenza, High Dose Seasonal PF 09/26/2013, 09/27/2014, 10/03/2015, 10/05/2016  . Pneumococcal Conjugate-13 01/30/2014  . Pneumococcal Polysaccharide-23 10/20/2011  . Td 01/06/2000   Last Colon -  Past Surgical History:  Procedure Laterality Date  . APPENDECTOMY  03/05/2017   laproscopic  . ATRIAL FIBRILLATION ABLATION N/A 10/26/2013   Procedure: ATRIAL FIBRILLATION ABLATION;  Surgeon: Coralyn Mark, MD;  Location: [facility];  Service: Cardiovascular;  Laterality: N/A;  . BASAL CELL CARCINOMA EXCISION     x3 on face  . CARDIAC CATHETERIZATION     myocardial bridge but no cad  . CARDIOVERSION N/A 08/23/2013   Procedure: CARDIOVERSION;  Surgeon: Sanda Klein, MD;  Location: [facility];  Service: Cardiovascular;  Laterality: N/A;  . CARPOMETACARPEL SUSPENSION PLASTY Left 02/14/2014   Procedure: CARPOMETACARPEL ([place]) SUSPENSIONPLASTY THUMB  WITH  ABDUCTOR POLLICIS LONGUS TRANSFER AND STENOSING TENOSYNOVITIS RELEASE LEFT WRIST;  Surgeon: Charlotte Crumb, MD;  Location: [facility];  Service: Orthopedics;  Laterality: Left;  . CATARACT EXTRACTION     bilateral  . COLONOSCOPY WITH PROPOFOL N/A 01/26/2017   Procedure: COLONOSCOPY WITH PROPOFOL;  Surgeon: Doran Stabler, MD;  Location: [facility];  Service: Gastroenterology;  Laterality: N/A;  . CORONARY ARTERY BYPASS GRAFT N/A 04/05/2013   Procedure: CORONARY ARTERY BYPASS GRAFTING (CABG) TIMES TWO USING LEFT INTERNAL  MAMMARY ARTERY AND RIGHT SAPHENOUS LEG VEIN HARVESTED ENDOSCOPICALLY;  Surgeon: Rexene Alberts, MD;  Location: [facility];  Service: Open  Heart Surgery;  Laterality: N/A;  . ESOPHAGOGASTRODUODENOSCOPY (EGD) WITH PROPOFOL N/A 01/26/2017   Procedure: ESOPHAGOGASTRODUODENOSCOPY (EGD) WITH PROPOFOL;  Surgeon: Doran Stabler, MD;  Location: [facility];  Service: Gastroenterology;  Laterality: N/A;  . GREAT TOE ARTHRODESIS, INTERPHALANGEAL JOINT     Right foot  . INTRAOPERATIVE TRANSESOPHAGEAL ECHOCARDIOGRAM N/A 04/05/2013   Procedure: INTRAOPERATIVE TRANSESOPHAGEAL ECHOCARDIOGRAM;  Surgeon: Rexene Alberts, MD;  Location: [facility];  Service: Open Heart Surgery;  Laterality: N/A;  . LAPAROSCOPIC APPENDECTOMY N/A 03/05/2017   Procedure: APPENDECTOMY LAPAROSCOPIC;  Surgeon: Judeth Horn, MD;  Location: [facility];  Service: General;  Laterality: N/A;  . LEFT HEART CATHETERIZATION WITH CORONARY ANGIOGRAM N/A 03/07/2013   Procedure: LEFT HEART CATHETERIZATION WITH CORONARY ANGIOGRAM;  Surgeon: Burnell Blanks, MD;  [facility];  Service: Cardiovascular;  Laterality: N/A;  . MAZE N/A 04/05/2013   Procedure: MAZE;  Surgeon: Rexene Alberts, MD;  Location: [facility];  Service: Open Heart Surgery;  Laterality: N/A;  . Polinydal cyst     Removed  . POLYPECTOMY    . Retina repair-right    . RIGHT HEART CATHETERIZATION N/A 05/03/2014   Procedure: RIGHT HEART CATH;  Surgeon: Jolaine Artist, MD;  Location: [facility] CATH LAB;  Service: Cardiovascular;  Laterality: N/A;  . TEE WITHOUT CARDIOVERSION N/A 08/23/2013   Procedure: TRANSESOPHAGEAL ECHOCARDIOGRAM (TEE);  Surgeon: Sanda Klein, MD;  Location: [facility] ENDOSCOPY;  Service: Cardiovascular;  Laterality: N/A;  . TEE WITHOUT CARDIOVERSION N/A 10/26/2013   Procedure: TRANSESOPHAGEAL ECHOCARDIOGRAM (TEE);  Surgeon: Sueanne Margarita, MD;  Location: [facility] ENDOSCOPY;  Service: Cardiovascular;  Laterality: N/A;  . TEE WITHOUT CARDIOVERSION N/A 06/05/2014   Procedure:  TRANSESOPHAGEAL ECHOCARDIOGRAM (TEE);  Surgeon: Thayer Headings, MD;  Location: [facility] ENDOSCOPY;  Service: Cardiovascular;  Laterality: N/A;  . TRAPEZIUM RESECTION     Family History  Problem Relation Age of Onset  . Dementia Father   . Colon cancer Mother        Family History/Uncle   . Colon polyps Mother        Family History  . Atrial fibrillation Mother   . Hypertension Mother   . Colon polyps Sister        Family history  . Diabetes Maternal Uncle   . Stroke Paternal Uncle    Social History   Socioeconomic History  . Marital status: Married    Spouse name: Reta  . Number of children: 1  . Years of education: Not on file  . Highest education level: Not on file  Occupational History  . Occupation: retired Software engineer  Tobacco Use  . Smoking status: Former Smoker    Packs/day: 4.00    Years: 25.00    Pack years: 100.00    Types: Cigarettes    Last attempt to quit: 01/05/1981    Years since quitting: 36.3  . Smokeless tobacco: Never Used  Substance and Sexual Activity  . Alcohol use: Yes    Alcohol/week: 0.6 oz    Types: 1 Shots of liquor per week    Comment: 1-5 drinks per week  . Drug use: No  . Sexual activity: Not on file  Social History Narrative   Daily caffeine-yes   Patient gets regular exercise.   Pt lives in [place] with spouse.  Retired Software engineer.  ROS Constitutional: Denies fever, chills, weight loss/gain, headaches, insomnia,  night sweats or change in appetite. Does c/o fatigue. Eyes: Denies redness, blurred vision, diplopia, discharge, itchy or watery eyes.  ENT: Denies discharge, congestion, post nasal drip, epistaxis, sore throat, earache, hearing loss, dental pain, Tinnitus, Vertigo, Sinus pain or snoring.  Cardio: Denies chest pain, palpitations, irregular heartbeat, syncope, dyspnea, diaphoresis, orthopnea, PND, claudication or edema Respiratory: denies cough, dyspnea,  DOE, pleurisy, hoarseness, laryngitis or wheezing.  Gastrointestinal: Denies dysphagia, heartburn, reflux, water brash, pain, cramps, nausea, vomiting, bloating, diarrhea, constipation, hematemesis, melena, hematochezia, jaundice or hemorrhoids Genitourinary: Denies dysuria, frequency, urgency, nocturia, hesitancy, discharge, hematuria or flank pain Musculoskeletal: Denies arthralgia, myalgia, stiffness, Jt. Swelling, pain, limp or strain/sprain. Denies Falls. Skin: Denies puritis, rash, hives, warts, acne, eczema or change in skin lesion Neuro: No weakness, tremor, incoordination, spasms, paresthesia or pain Psychiatric: Denies confusion, memory loss or sensory loss. Denies Depression. Endocrine: Denies change in weight, skin, hair change, nocturia, and paresthesia, diabetic polys, visual blurring or hyper / hypo glycemic episodes.  Heme/Lymph: No excessive bleeding, bruising or enlarged lymph nodes.  Physical Exam  BP 118/64   Pulse 64   Temp (!) 97.5 F (36.4 C)   Resp 16   Ht 5' 11"  (1.803 m)   Wt 184 lb 3.2 oz (83.6 kg)   BMI 25.69 kg/m   General Appearance: Well nourished and well groomed and in no apparent distress.  Eyes: PERRLA, EOMs, conjunctiva no swelling or erythema, normal fundi and vessels. Sinuses: No frontal/maxillary tenderness ENT/Mouth: EACs patent / TMs  nl. Nares clear without erythema, swelling, mucoid exudates. Oral hygiene is good. No erythema, swelling, or exudate. Tongue normal, non-obstructing. Tonsils not swollen or erythematous. Hearing normal.  Neck: Supple, thyroid not palpable. No bruits, nodes or JVD. Respiratory: Respiratory effort normal.  BS equal and clear bilateral without rales, rhonci, wheezing or stridor. Cardio: Heart sounds are normal with regular rate and rhythm and no murmurs, rubs or gallops. Peripheral pulses are normal and equal bilaterally without edema. No aortic or femoral bruits. Chest: symmetric with normal excursions and percussion.   Abdomen: Soft, with Nl bowel sounds. Nontender, no guarding, rebound, hernias, masses, or organomegaly.  Lymphatics: Non tender without lymphadenopathy.  Genitourinary: No hernias.Testes nl. DRE - prostate nl for age - smooth & firm w/o nodules. Musculoskeletal: Full ROM all peripheral extremities, joint stability, 5/5 strength, and normal gait. Skin: Warm and dry without rashes, lesions, cyanosis, clubbing or  ecchymosis.  Neuro: Cranial nerves intact, reflexes equal bilaterally. Normal muscle tone, no cerebellar symptoms. Sensation intact to touch, vibratory and Monofilament to the toes bilaterally.  Pysch: Alert and oriented X 3 with normal affect, insight and judgment appropriate.   Assessment and Plan  1. Annual Preventative/Screening Exam   2. Essential hypertension  - EKG 12-Lead - [place], RETROPERITNL ABD,  LTD - Urinalysis, Routine w reflex microscopic - Microalbumin / creatinine urine ratio - CBC with Differential/Platelet - COMPLETE METABOLIC PANEL WITH GFR - Magnesium - TSH  3. Hyperlipidemia, mixed  - EKG 12-Lead - [place], RETROPERITNL ABD,  LTD - Lipid panel - TSH  4. Controlled type 2 diabetes mellitus with stage 3 chronic kidney disease, without long-term current use of insulin (HCC)  - EKG 12-Lead - [place], RETROPERITNL ABD,  LTD - Urinalysis, Routine w reflex microscopic - Microalbumin / creatinine urine ratio - Hemoglobin A1c - Insulin, random - HM DIABETES FOOT EXAM - LOW EXTREMITY NEUR EXAM DOCUM  5. Vitamin D deficiency  - VITAMIN D  25 Hydroxy  6. PAF (paroxysmal atrial fibrillation) (HCC)  - EKG 12-Lead - Protime-INR  7. Gout, unspecified cause, unspecified chronicity, unspecified site  - Uric acid  8. Coronary artery disease involving native coronary artery of native heart without angina pectoris  - EKG 12-Lead  9. Chronic diastolic congestive heart failure ([place])  10. Screening for ischemic heart disease  - EKG 12-Lead  11. Screening for  AAA (aortic abdominal aneurysm)  - [place], RETROPERITNL ABD,  LTD  12. Former smoker  - EKG 12-Lead - [place], RETROPERITNL ABD,  LTD  13. FHx: heart disease  - EKG 12-Lead - [place], RETROPERITNL ABD,  LTD  14. Medication management  - Urinalysis, Routine w reflex microscopic - Microalbumin / creatinine urine ratio - CBC with Differential/Platelet - COMPLETE METABOLIC PANEL WITH GFR  15. BPH with urinary obstruction  - PSA  16. Encounter for monitoring Coumadin therapy  - Protime-INR            Patient was counseled in prudent diet, weight control to achieve/maintain BMI less than 25, BP monitoring, regular exercise and medications as discussed.  Discussed med effects and SE's. Routine screening labs and tests as requested with regular follow-up as recommended. Over 40 minutes of exam, counseling, chart review and high complex critical decision making was performed

## 2017-04-22 ENCOUNTER — Other Ambulatory Visit: Payer: Self-pay | Admitting: Internal Medicine

## 2017-04-22 DIAGNOSIS — N183 Chronic kidney disease, stage 3 unspecified: Secondary | ICD-10-CM

## 2017-04-22 LAB — URINALYSIS, ROUTINE W REFLEX MICROSCOPIC
BILIRUBIN URINE: NEGATIVE
Hgb urine dipstick: NEGATIVE
Ketones, ur: NEGATIVE
LEUKOCYTES UA: NEGATIVE
Nitrite: NEGATIVE
PROTEIN: NEGATIVE
Specific Gravity, Urine: 1.014 (ref 1.001–1.03)
pH: 5.5 (ref 5.0–8.0)

## 2017-04-22 LAB — COMPLETE METABOLIC PANEL WITH GFR
AG RATIO: 1.7 (calc) (ref 1.0–2.5)
ALT: 17 U/L (ref 9–46)
AST: 15 U/L (ref 10–35)
Albumin: 4.3 g/dL (ref 3.6–5.1)
Alkaline phosphatase (APISO): 126 U/L — ABNORMAL HIGH (ref 40–115)
BILIRUBIN TOTAL: 0.6 mg/dL (ref 0.2–1.2)
BUN / CREAT RATIO: 21 (calc) (ref 6–22)
BUN: 41 mg/dL — ABNORMAL HIGH (ref 7–25)
CHLORIDE: 98 mmol/L (ref 98–110)
CO2: 29 mmol/L (ref 20–32)
Calcium: 9.2 mg/dL (ref 8.6–10.3)
Creat: 1.97 mg/dL — ABNORMAL HIGH (ref 0.70–1.18)
GFR, EST AFRICAN AMERICAN: 37 mL/min/{1.73_m2} — AB (ref >=60)
GFR, Est Non African American: 32 mL/min/{1.73_m2} — ABNORMAL LOW (ref >=60)
Globulin: 2.6 g/dL (calc) (ref 1.9–3.7)
Glucose, Bld: 285 mg/dL — ABNORMAL HIGH (ref 65–99)
Potassium: 3.8 mmol/L (ref 3.5–5.3)
Sodium: 136 mmol/L (ref 135–146)
TOTAL PROTEIN: 6.9 g/dL (ref 6.1–8.1)

## 2017-04-22 LAB — CBC WITH DIFFERENTIAL/PLATELET
BASOS PCT: 0.4 %
Basophils Absolute: 34 cells/uL (ref 0–200)
Eosinophils Absolute: 94 cells/uL (ref 15–500)
Eosinophils Relative: 1.1 %
HCT: 35.1 % — ABNORMAL LOW (ref 38.5–50.0)
HEMOGLOBIN: 12 g/dL — AB (ref 13.2–17.1)
Lymphs Abs: 1343 cells/uL (ref 850–3900)
MCH: 30.9 pg (ref 27.0–33.0)
MCHC: 34.2 g/dL (ref 32.0–36.0)
MCV: 90.5 fL (ref 80.0–100.0)
MONOS PCT: 6.7 %
MPV: 9.2 fL (ref 7.5–12.5)
Neutro Abs: 6460 cells/uL (ref 1500–7800)
Neutrophils Relative %: 76 %
Platelets: 197 10*3/uL (ref 140–400)
RBC: 3.88 10*6/uL — AB (ref 4.20–5.80)
RDW: 15.9 % — AB (ref 11.0–15.0)
TOTAL LYMPHOCYTE: 15.8 %
WBC mixed population: 570 cells/uL (ref 200–950)
WBC: 8.5 10*3/uL (ref 3.8–10.8)

## 2017-04-22 LAB — PROTIME-INR
INR: 2.6 — ABNORMAL HIGH
Prothrombin Time: 27.2 s — ABNORMAL HIGH (ref 9.0–11.5)

## 2017-04-22 LAB — TSH: TSH: 1.37 mIU/L (ref 0.40–4.50)

## 2017-04-22 LAB — HEMOGLOBIN A1C
HEMOGLOBIN A1C: 10.7 %{Hb} — AB (ref <5.7)
Mean Plasma Glucose: 260 (calc)
eAG (mmol/L): 14.4 (calc)

## 2017-04-22 LAB — PSA: PSA: 0.7 ng/mL (ref <=4.0)

## 2017-04-22 LAB — MAGNESIUM: Magnesium: 2.1 mg/dL (ref 1.5–2.5)

## 2017-04-22 LAB — LIPID PANEL
Cholesterol: 134 mg/dL (ref <200)
HDL: 26 mg/dL — AB (ref >40)
LDL Cholesterol (Calc): 71 mg/dL (calc)
NON-HDL CHOLESTEROL (CALC): 108 mg/dL (ref <130)
Total CHOL/HDL Ratio: 5.2 (calc) — ABNORMAL HIGH (ref <5.0)
Triglycerides: 308 mg/dL — ABNORMAL HIGH (ref <150)

## 2017-04-22 LAB — MICROALBUMIN / CREATININE URINE RATIO
CREATININE, URINE: 28 mg/dL (ref 20–320)
MICROALB UR: 2.6 mg/dL
Microalb Creat Ratio: 93 mcg/mg creat — ABNORMAL HIGH (ref <30)

## 2017-04-22 LAB — INSULIN, RANDOM: INSULIN: 11.5 u[IU]/mL (ref 2.0–19.6)

## 2017-04-22 LAB — VITAMIN D 25 HYDROXY (VIT D DEFICIENCY, FRACTURES): Vit D, 25-Hydroxy: 55 ng/mL (ref 30–100)

## 2017-04-22 LAB — URIC ACID: Uric Acid, Serum: 6.7 mg/dL (ref 4.0–8.0)

## 2017-04-23 ENCOUNTER — Other Ambulatory Visit: Payer: Self-pay | Admitting: Internal Medicine

## 2017-04-24 ENCOUNTER — Encounter: Payer: Self-pay | Admitting: Internal Medicine

## 2017-05-10 ENCOUNTER — Ambulatory Visit (INDEPENDENT_AMBULATORY_CARE_PROVIDER_SITE_OTHER): Payer: Medicare Other | Admitting: Internal Medicine

## 2017-05-10 VITALS — BP 122/70 | HR 80 | Temp 97.2°F | Resp 18 | Ht 71.0 in | Wt 186.6 lb

## 2017-05-10 DIAGNOSIS — N183 Chronic kidney disease, stage 3 unspecified: Secondary | ICD-10-CM

## 2017-05-10 DIAGNOSIS — E1122 Type 2 diabetes mellitus with diabetic chronic kidney disease: Secondary | ICD-10-CM

## 2017-05-10 NOTE — Patient Instructions (Signed)

## 2017-05-10 NOTE — Progress Notes (Signed)
Subjective:    Patient ID: [name], male    DOB: 1940/10/11, 77 y.o.   MRN: [id number]  HPI   This very nice 77 yo MWM with HTN, ASHD, pAfib, dias HF due to Pulm Hypertension, OSA/CPAP, hx/o Gout & T2_DM w/ CKD3 with GFR's recently ranging 22-41 and most recently 45 returns today for d/c'ing Metformin & Glipizide to transition to Novolin 70/30 bid.  Last A1c  on April 17th was 10.7% - way out of control.   Medication Sig  . Acetaminophen 500 MG tablet Take 1,000 mg  every 6  hours as needed   . ALPRAZolam  1 MG tablet Take 0.5 tablets (0.5 mg total) by mouth at bedtime as needed for sleep.  [name] GAVISCON Take 4 tablets by mouth daily as needed (acid reflux).   [name] aspirin EC 81 MG tablet Take 81 mg by mouth daily.  [name] atorvastatin  40 MG tablet Take 20 mg by mouth every Monday, Wednesday, and Friday.   . ASTELIN nasal spray PLACE 2 SPRAYS INTO  NOSTRILS 2  TIMES DAILY.   [name] VITAMIN B COMPLEX Take 1 tablet by mouth at bedtime.   . bacitracin  ointment Apply 1 application topically daily as needed for wound care.  . carvedilol  6.25 MG tablet TAKE 1 TAB TWO  TIMES DAILY   . VITAMIN D  Take 5,000 Units by mouth 2 (two) times daily.  . colchicine 0.6 MG tablet Take 0.6 mg by mouth daily as needed (for gout flare up).   . BENADRYL 25 MG tablet Take 25 mg by mouth at bedtime as needed for allergies.   . ferrous sulfate 325  MG tablet Take 325 mg by mouth 2 (two) times daily with a meal.  . FLONASE  nasal spray Place 1 spray into both nostrils daily as needed for allergies.   [name] glipiZIDE  10 MG tablet TAKE 1 TABLET 3 TIMES DAILY BEFORE MEALS   . NORCO 5-325 MG tablet Take 1-2 tab every 6  hours as needed for moderate pain.   [name] loratadine  10 MG tablet Take 10 mg by mouth daily as needed for allergies.   . metFORMIN-XR 500 MG TAKE 2 TABLETS BY MOUTH TWO TIMES DAILY  . metolazone 5 MG tablet Take 5 mg by mouth daily as needed (weight gain and edema).  . Multiple Vitamin w/ MINERALS  Take 1 tablet by  mouth at bedtime.   [name] PRESERVISION AREDS 2 Take 1 capsule by mouth 2 (two) times daily.   . ARTIFICIAL TEARS ophth soln Place 1 drop into both eyes daily as needed for dry eyes.  [name] K-DUR 20 MEQ tablet Take 20 mEq by mouth 3 (three) times daily.  .  CPAP Inhale into the lungs at bedtime.  . ranitidine 150 MG tablet Take 150 mg by mouth daily as needed for heartburn.   . sertraline 100 MG tablet TAKE 1 TABLET BY MOUTH  DAILY  . torsemide  100 MG tablet Take 100 mg in AM and 50 mg in PM if needed for weight gain   . valsartan  320 MG tablet Take 0.5 tablets  daily.  [name] warfarin  5 MG tablet Take 7.5-10 mg  Take 7.5 mg  on Sunday. Take 10 mg on other days  . allopurinol  300 MG tablet Take 1 tablet daily to prevent gout   . VITAMIN D 5000 units  Take 5,000 Units by mouth at bedtime.   Review of Systems  10 point systems review negative except as above.     Objective:   Physical Exam  BP 122/70   Pulse 80   Temp (!) 97.2 F (36.2 C)   Resp 18   Ht 5\' 11"  (1.803 m)   Wt 186 lb 9.6 oz (84.6 kg)   BMI 26.03 kg/m   In No Distress  HEENT - Eac's patent. TM's Nl. EOM's full. PERRLA. NasoOroPharynx clear. Neck - supple. Nl Thyroid. Carotids 2+ & No bruits, nodes, JVD Chest - Clear equal BS w/o rales, rhonchi, wheezes. Cor - Nl HS. RRR w/o sig MGR. PP 1(+). No edema. MS- FROM w/o deformities. Muscle power, tone and bulk Nl. Gait Nl. Neuro - No obvious Cr N abnormalities.  Nl w/o focal abnormalities. Skin - exposed clear w/o rash cyanosis or icterus.    Assessment & Plan:   1. Controlled type 2 diabetes mellitus with stage 3 chronic kidney disease, without long-term current use of insulin ([place])  - Patient is a retired Software engineer & has a good understanding of Insulin dynamics & sliding scale coverage   - Recommended monitor FBG's bid ac BFKST & Supper. Discussed prudent Diabetic diet.  - Start Novolin 70/30 at 15 Units qam & 10 units qpm  - has 2 week f/u for Coumadin w/[facility].

## 2017-05-20 ENCOUNTER — Encounter (INDEPENDENT_AMBULATORY_CARE_PROVIDER_SITE_OTHER): Payer: Medicare Other | Admitting: Ophthalmology

## 2017-05-20 DIAGNOSIS — E113393 Type 2 diabetes mellitus with moderate nonproliferative diabetic retinopathy without macular edema, bilateral: Secondary | ICD-10-CM

## 2017-05-20 DIAGNOSIS — H338 Other retinal detachments: Secondary | ICD-10-CM | POA: Diagnosis not present

## 2017-05-20 DIAGNOSIS — H43813 Vitreous degeneration, bilateral: Secondary | ICD-10-CM

## 2017-05-20 DIAGNOSIS — I10 Essential (primary) hypertension: Secondary | ICD-10-CM

## 2017-05-20 DIAGNOSIS — H35033 Hypertensive retinopathy, bilateral: Secondary | ICD-10-CM | POA: Diagnosis not present

## 2017-05-20 DIAGNOSIS — H348312 Tributary (branch) retinal vein occlusion, right eye, stable: Secondary | ICD-10-CM | POA: Diagnosis not present

## 2017-05-20 DIAGNOSIS — H353132 Nonexudative age-related macular degeneration, bilateral, intermediate dry stage: Secondary | ICD-10-CM

## 2017-05-20 DIAGNOSIS — E11319 Type 2 diabetes mellitus with unspecified diabetic retinopathy without macular edema: Secondary | ICD-10-CM

## 2017-05-23 NOTE — Progress Notes (Signed)
Assessment and Plan:  1. PAF (paroxysmal atrial fibrillation) (HCC) Check INR and will adjust medication according to labs.  Discussed if patient falls to immediately contact office or go to ER. Discussed foods that can increase or decrease Coumadin levels. Patient understands to call the office before starting a new medication. Follow up in one month.   2. Chronic diastolic congestive heart failure (HCC) Weights stable Emphasized salt restriction, less than 2032m a day. Encouraged daily monitoring of the patient's weight, call office if 5 lb weight loss or gain in a day.  Encouraged regular exercise. If any increasing shortness of breath, swelling, or chest pressure go to ER immediately.  decrease your fluid intake to less than 2 L daily please remember to always increase your potassium intake with any increase of your fluid pill.   3. CKD (chronic kidney disease) stage 3, GFR 30-59 ml/min (HCC) Increase fluids, avoid NSAIDS, monitor sugars, will monitor BMP/GFR  4. Anemia, unspecified type CBC  5. Diabetes Doing well on insulin - taking 15 units AM, 10 units PM - increase to 20 units and 15 units - given instructions to taper up by 2-3 units every 3 days for sugars consistently above goal follow up closely in 1 month Information for hypoglycemia management discussed and provided.  Keep a glucose log and present at next visit    Further disposition pending results of labs. Discussed med's effects and SE's.   Over 30 minutes of exam, counseling, chart review, and critical decision making was performed.   Future Appointments  Date Time Provider [place] 06/24/2017  3:45 PM CLiane Comber NP [facility] None  07/27/2017  3:45 PM CLiane Comber NP [facility] None  08/26/2017  8:15 AM MHayden Pedro MD [facility] None  05/18/2018  2:00 PM MUnk Pinto MD [facility] None     ------------------------------------------------------------------------------------------------------------------   HPI BP 100/68   Pulse 69   Temp (!) 97.5 F (36.4 C)   Resp 16   Ht 5' 11"  (1.803 m)   Wt 185 lb 3.2 oz (84 kg)   SpO2 95%   BMI 25.83 kg/m   77 y.o.male presents for follow up on coumadin, CHF, CKD III and anemia.   He has a history of Diastolic, denies dyspnea on exertion, orthopnea, paroxysmal nocturnal dyspnea and edema. Positive for none. Wt Readings from Last 3 Encounters:  05/24/17 185 lb 3.2 oz (84 kg)  05/10/17 186 lb 9.6 oz (84.6 kg)  04/21/17 184 lb 3.2 oz (83.6 kg)   Patient is on Coumadin for PAF (paroxysmal atrial fibrillation) (HCC) [I48.0] Patient's last INR is  Lab Results  Component Value Date   INR 2.6 (H) 04/21/2017   INR 1.16 03/05/2017   INR 1.8 (H) 03/01/2017    Patient denies SOB, CP, dizziness, nose bleeds, easy bleeding, and blood in stool/urine. His coumadin dose was not changed last visit. He has not taken ABX, has not missed any doses and denies a fall.    Current dose: 2 tabs of 5 mg 6 days a week, then 1.5 tabs 1 day a week.    He has been working on diet and exercise for T2 diabetes, and denies foot ulcerations, hypoglycemia , increased appetite, nausea, paresthesia of the feet, polydipsia, polyuria, visual disturbances, vomiting and weight loss. Recently transitioned off of metformin and glipizide to novolin 70/30 - 15 units AM, 10 units PM. His reported fasting glucose (estimates, doesn't present with log) running 230-360 fasting in the AM, he checks in the evening  prior to dinner, running 240-265. Last A1C in the office was:  Lab Results  Component Value Date   HGBA1C 10.7 (H) 04/21/2017     Past Medical History:  Diagnosis Date  . Adrenal adenoma   . Anemia   . Anxiety   . Arthritis   . Atypical atrial flutter ([place]) 8/15, 10/15   a. DCCV 08/2013. b. s/p RFA 10/2013.  [name] Basal cell carcinoma   . CAD (coronary  artery disease)    a. 04/2013 CABG x 2: LIMA to LAD, SVG to RI, EVH via R thigh.  . Cellulitis 12/2015   left leg  . Chronic diastolic congestive heart failure ([place])   . CKD (chronic kidney disease), stage III ([place])   . Depression   . Diabetes mellitus type II   . Diverticulosis 2001  . DJD (degenerative joint disease)   . GERD (gastroesophageal reflux disease)   . Gout   . H/O hiatal hernia   . History of cardioversion    x3 (years uncertain)  . Hx of adenomatous colonic polyps   . Hyperlipidemia   . Hypertension   . Hypertensive cardiomyopathy ([place])   . Obstructive sleep apnea    compliant with CPAP  . 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 Persistent atrial fibrillation ([place])    a. s/p MAZE 04/2013 in setting of CABG. b. Amio stopped in 10/2013 after flutter ablation.  [name] PFO (patent foramen ovale)    a. Small PFO by TEE 10/2013.  [name] Pleural effusion, left    a. s/p thoracentesis 05/2013.  [name] Respiratory failure ([place])    a. Hypoxia 10/2013 - required supp O2 as inpatient, did not require it at discharge.  . S/P Maze operation for atrial fibrillation    a. 04/2013: Complete bilateral atrial lesion set using cryothermy and bipolar radiofrequency ablation with clipping of LA appendage (@ time of CABG)  . S/P Maze operation for atrial fibrillation 04/05/2013   Complete bilateral atrial lesion set using cryothermy and bipolar radiofrequency ablation with clipping of LA appendage via median sternotomy approach      Allergies  Allergen Reactions  . Sunflower Seed [Sunflower Oil] Swelling and Other (See Comments)    Tongue and lip swelling  . Horse-Derived Products Other (See Comments)    Per allergy skin test UNSPECIFIED REACTION   . Tetanus Toxoids Other (See Comments)    Per allergy skin test  . Tetanus Toxoid     Other reaction(s): Other (See Comments) Rash(horse serum)     Current Outpatient Medications on File Prior to Visit  Medication Sig  . ACCU-CHEK AVIVA PLUS test strip CHECK BLOOD GLUCOSE 3 TIMES DAILY.  [name] acetaminophen (TYLENOL) 500 MG tablet Take 1,000 mg by mouth every 6 (six) hours as needed for moderate pain or headache.   . ALPRAZolam (XANAX) 1 MG tablet Take 0.5 tablets (0.5 mg total) by mouth at bedtime as needed for sleep.  [name] Alum Hydroxide-Mag Carbonate (GAVISCON PO) Take 4 tablets by mouth daily as needed (acid reflux).   [name] aspirin EC 81 MG tablet Take 81 mg by mouth daily.  [name] atorvastatin (LIPITOR) 40 MG tablet Take 20 mg by mouth every Monday, Wednesday, and Friday.   [name] azelastine (ASTELIN) 0.1 % nasal spray PLACE 2 SPRAYS INTO BOTH NOSTRILS 2 (TWO) TIMES DAILY. USE IN EACH NOSTRIL AS DIRECTED  . B Complex Vitamins (VITAMIN B COMPLEX PO) Take  1 tablet by mouth at bedtime.   . bacitracin 500 UNIT/GM ointment Apply 1 application topically daily as needed for wound care.  . Blood Glucose Monitoring Suppl (ACCU-CHEK AVIVA PLUS) w/Device KIT Check blood sugar 1 time  daily  . carvedilol (COREG) 6.25 MG tablet TAKE 1 TABLET BY MOUTH TWO  TIMES DAILY (Patient taking differently: TAKE 6.25 MG BY MOUTH TWO  TIMES DAILY)  . Cholecalciferol (VITAMIN D PO) Take 5,000 Units by mouth 2 (two) times daily.  . colchicine 0.6 MG tablet Take 0.6 mg by mouth daily as needed (for gout flare up).   . diphenhydrAMINE (BENADRYL) 25 MG tablet Take 25 mg by mouth at bedtime as needed for allergies.   . ferrous sulfate 325 (65 FE) MG tablet Take 325 mg by mouth 2 (two) times daily with a meal.  . fluticasone (FLONASE) 50 MCG/ACT nasal spray Place 1 spray into both nostrils daily as needed for allergies.   [name] HYDROcodone-acetaminophen (NORCO/VICODIN) 5-325 MG tablet Take 1-2 tablets by mouth every 6 (six) hours as needed for moderate pain.   [name] insulin NPH-regular Human (NOVOLIN 70/30) (70-30) 100 UNIT/ML injection Inject into the skin.  [name] insulin NPH-regular Human  (NOVOLIN 70/30) (70-30) 100 UNIT/ML injection Inject 25 Units into the skin daily. 15 units AM, 10 units PM  . lidocaine (LIDODERM) 5 % Place 1 patch onto the skin daily as needed (for pain). Remove & Discard patch within 12 hours or as directed by MD   . loratadine (CLARITIN) 10 MG tablet Take 10 mg by mouth daily as needed for allergies.   . metolazone (ZAROXOLYN) 5 MG tablet Take 5 mg by mouth daily as needed (weight gain and edema).  . Multiple Vitamin (MULTIVITAMIN WITH MINERALS) TABS tablet Take 1 tablet by mouth at bedtime.   . Multiple Vitamins-Minerals (PRESERVISION AREDS 2) CAPS Take 1 capsule by mouth 2 (two) times daily.   . polyvinyl alcohol (ARTIFICIAL TEARS) 1.4 % ophthalmic solution Place 1 drop into both eyes daily as needed for dry eyes.  . potassium chloride SA (K-DUR,KLOR-CON) 20 MEQ tablet Take 20 mEq by mouth 3 (three) times daily.  [name] PRESCRIPTION MEDICATION Inhale into the lungs at bedtime. CPAP  . ranitidine (ZANTAC) 150 MG tablet Take 150 mg by mouth daily as needed for heartburn.   . sertraline (ZOLOFT) 100 MG tablet TAKE 1 TABLET BY MOUTH  DAILY  . torsemide (DEMADEX) 100 MG tablet Take 100 mg in AM and 50 mg in PM if needed for weight gain (Patient taking differently: Take 50-100 mg by mouth See admin instructions. TAKE 1 TABLET (100 MG) BY MOUTH SCHEDULED IN THE MORNING, AND MAY TAKE 0.5 TABLET (50 MG) IN THE EVENING IF NEEDED FOR SWELLING/WEIGHT GAIN)  . valsartan (DIOVAN) 320 MG tablet Take 0.5 tablets (160 mg total) by mouth daily.  [name] warfarin (COUMADIN) 5 MG tablet Take 7.5-10 mg by mouth See admin instructions. Take 7.5 mg by mouth daily on Sunday. Take 10 mg by mouth daily on all other days  . allopurinol (ZYLOPRIM) 300 MG tablet Take 1 tablet daily to prevent gout (Patient taking differently: Take 300 mg by mouth daily. )   No current facility-administered medications on file prior to visit.     ROS: Review of Systems  Constitutional: Negative for  malaise/fatigue and weight loss.  HENT: Negative for hearing loss and tinnitus.   Eyes: Negative for blurred vision and double vision.  Respiratory: Negative for cough, shortness of breath and wheezing.  Cardiovascular: Negative for chest pain, palpitations, orthopnea, claudication and leg swelling.  Gastrointestinal: Negative for abdominal pain, blood in stool, constipation, diarrhea, heartburn, melena, nausea and vomiting.  Genitourinary: Negative.   Musculoskeletal: Negative for joint pain and myalgias.  Skin: Negative for rash.  Neurological: Negative for dizziness, tingling, sensory change, weakness and headaches.  Endo/Heme/Allergies: Negative for polydipsia.  Psychiatric/Behavioral: Negative.   All other systems reviewed and are negative.    Physical Exam:  BP 100/68   Pulse 69   Temp (!) 97.5 F (36.4 C)   Resp 16   Ht 5' 11"  (1.803 m)   Wt 185 lb 3.2 oz (84 kg)   SpO2 95%   BMI 25.83 kg/m   General Appearance: Well nourished, in no apparent distress. Eyes: PERRLA, EOMs, conjunctiva no swelling or erythema Sinuses: No Frontal/maxillary tenderness ENT/Mouth: Ext aud canals clear, TMs without erythema, bulging. No erythema, swelling, or exudate on post pharynx.  Tonsils not swollen or erythematous. Hearing normal.  Neck: Supple, thyroid normal.  Respiratory: Respiratory effort normal, BS equal bilaterally without rales, rhonchi, wheezing or stridor.  Cardio: RRR with no MRGs. Brisk peripheral pulses without edema.  Abdomen: Soft, + BS.  Non tender, no guarding, rebound, hernias, masses. Lymphatics: Non tender without lymphadenopathy.  Musculoskeletal: Full ROM, 5/5 strength, normal gait.  Skin: Warm, dry without rashes, lesions, ecchymosis.  Neuro: Cranial nerves intact. Normal muscle tone, no cerebellar symptoms. Sensation intact.  Psych: Awake and oriented X 3, normal affect, Insight and Judgment appropriate.     Izora Ribas, NP 4:03 PM [facility]

## 2017-05-24 ENCOUNTER — Ambulatory Visit (INDEPENDENT_AMBULATORY_CARE_PROVIDER_SITE_OTHER): Payer: Medicare Other | Admitting: Adult Health

## 2017-05-24 ENCOUNTER — Encounter: Payer: Self-pay | Admitting: Adult Health

## 2017-05-24 VITALS — BP 100/68 | HR 69 | Temp 97.5°F | Resp 16 | Ht 71.0 in | Wt 185.2 lb

## 2017-05-24 DIAGNOSIS — I480 Paroxysmal atrial fibrillation: Secondary | ICD-10-CM

## 2017-05-24 DIAGNOSIS — D649 Anemia, unspecified: Secondary | ICD-10-CM

## 2017-05-24 DIAGNOSIS — N183 Chronic kidney disease, stage 3 unspecified: Secondary | ICD-10-CM

## 2017-05-24 DIAGNOSIS — E1122 Type 2 diabetes mellitus with diabetic chronic kidney disease: Secondary | ICD-10-CM

## 2017-05-24 DIAGNOSIS — I5032 Chronic diastolic (congestive) heart failure: Secondary | ICD-10-CM

## 2017-05-24 MED ORDER — BLOOD GLUCOSE MONITORING SUPPL DEVI: 0 refills | Status: AC

## 2017-05-24 NOTE — Patient Instructions (Addendum)
Check your sugars fasting (before breakfast) and at night - 1 hour before bed Keep a log and bring to visits consistently until sugars stabilize  Continue the 70/30 insulin Increase to 20 units in the AM and 15 units at night Every 3 days if your morning sugar is consistently above 150 increase your morning insulin by 3 units Every 3 days if your night time sugar is consistently above 200 increase your night time insulin by 2 units  Remember a low sugar is much more dangerous than a high sugar.      Targets for Glucose Readings: Time of Check Usual Target for Most People  Before Meals  70-130  Two hours after meals  Less than 180  Bedtime  90-150   Why Should You Check Your Blood Glucose? -The A1C tells you how your diabetes is doing over a 3 month period.  Home blood glucose monitoring (or checking) gives you information about your diabetes on a daily basis.  You will learn how well your diabetes care plan is working and whether your blood glucose is in your target range throughout the day.   -Reviewing daily blood glucose levels will help you and your healthcare team make any needed changes to you meal plan, physical activity and medications.  Meter Supplies: - A glucose meter was provided at your visit today along with strips and lancets. The blood glucose meter strips and lancets are usually covered by health insurance. Please let us know if they are not and contact your insurance to see which meter they prefer.  How to get a good blood sample: -Wash your hands in warm water.  You do not need to use alcohol wipes. -Massage your hands. -Choose which finger you will use.  It helps to use a different finger each time to avoid soreness. -Keep you hand below your wrist when using you lancet to "prick" your finger. -Apply gentle pressure, but do NOT squeeze your finger.  Checking your blood glucose Important Reminders: -Make sure your strips are not expired.  Check the date on the  bottle. -Make sure the code on bottle matches the code on your machine. -Make sure your hands are clean and dry. -Do not use the center of your finger, it is the most sensitive area.  Use a spot to the side of the center of your fingertip. -Completely fill the strip target area with blood (until it beeps) to make sure the results are accurate. -You will need a prescription to have your glucose strips and lancets covered by insurance. -There is usually an 800 number on the back of your meter for help with meter issues.  How often to check: -If you take diabetes pills or take one injection of insulin each day, you will usually be asked to check twice a day, before breakfast and 2 hours after one meal, or as directed. -If you take several insulin injections each day, you will usually be asked to check four times a day before meals and at bedtime every day.    Hypoglycemia Hypoglycemia occurs when the level of sugar (glucose) in the blood is too low. Glucose is a type of sugar that provides the body's main source of energy. Certain hormones (insulin and glucagon) control the level of glucose in the blood. Insulin lowers blood glucose, and glucagon increases blood glucose. Hypoglycemia can result from having too much insulin in the bloodstream, or from not eating enough food that contains glucose. Hypoglycemia can happen in people who  do or do not have diabetes. It can develop quickly, and it can be a medical emergency. What are the causes? Hypoglycemia occurs most often in people who have diabetes. If you have diabetes, hypoglycemia may be caused by:  Diabetes medicine.  Not eating enough, or not eating often enough.  Increased physical activity.  Drinking alcohol, especially when you have not eaten recently.  If you do not have diabetes, hypoglycemia may be caused by:  A tumor in the pancreas. The pancreas is the organ that makes insulin.  Not eating enough, or not eating for long  periods at a time (fasting).  Severe infection or illness that affects the liver, heart, or kidneys.  Certain medicines.  You may also have reactive hypoglycemia. This condition causes hypoglycemia within 4 hours of eating a meal. This may occur after having stomach surgery. Sometimes, the cause of reactive hypoglycemia is not known. What increases the risk? Hypoglycemia is more likely to develop in:  People who have diabetes and take medicines to lower blood glucose.  People who abuse alcohol.  People who have a severe illness.  What are the signs or symptoms? Hypoglycemia may not cause any symptoms. If you have symptoms, they may include:  Hunger.  Anxiety.  Sweating and feeling clammy.  Confusion.  Dizziness or feeling light-headed.  Sleepiness.  Nausea.  Increased heart rate.  Headache.  Blurry vision.  Seizure.  Nightmares.  Tingling or numbness around the mouth, lips, or tongue.  A change in speech.  Decreased ability to concentrate.  A change in coordination.  Restless sleep.  Tremors or shakes.  Fainting.  Irritability.  How is this diagnosed? Hypoglycemia is diagnosed with a blood test to measure your blood glucose level. This blood test is done while you are having symptoms. Your health care provider may also do a physical exam and review your medical history. If you do not have diabetes, other tests may be done to find the cause of your hypoglycemia. How is this treated? This condition can often be treated by immediately eating or drinking something that contains glucose, such as:  3-4 sugar tablets (glucose pills).  Glucose gel, 15-gram tube.  Fruit juice, 4 oz (120 mL).  Regular soda (not diet soda), 4 oz (120 mL).  Low-fat milk, 4 oz (120 mL).  Several pieces of hard candy.  Sugar or honey, 1 Tbsp.  Treating Hypoglycemia If You Have Diabetes  If you are alert and able to swallow safely, follow the 15:15 rule:  Take 15  grams of a rapid-acting carbohydrate. Rapid-acting options include: ? 1 tube of glucose gel. ? 3 glucose pills. ? 6-8 pieces of hard candy. ? 4 oz (120 mL) of fruit juice. ? 4 oz (120 ml) of regular (not diet) soda.  Check your blood glucose 15 minutes after you take the carbohydrate.  If the repeat blood glucose level is still at or below 70 mg/dL (3.9 mmol/L), take 15 grams of a carbohydrate again.  If your blood glucose level does not increase above 70 mg/dL (3.9 mmol/L) after 3 tries, seek emergency medical care.  After your blood glucose level returns to normal, eat a meal or a snack within 1 hour.  Treating Severe Hypoglycemia Severe hypoglycemia is when your blood glucose level is at or below 54 mg/dL (3 mmol/L). Severe hypoglycemia is an emergency. Do not wait to see if the symptoms will go away. Get medical help right away. Call your local emergency services (911 in the [place] Do  not drive yourself to the hospital. If you have severe hypoglycemia and you cannot eat or drink, you may need an injection of glucagon. A family member or close friend should learn how to check your blood glucose and how to give you a glucagon injection. Ask your health care provider if you need to have an emergency glucagon injection kit available. Severe hypoglycemia may need to be treated in a hospital. The treatment may include getting glucose through an IV tube. You may also need treatment for the cause of your hypoglycemia. Follow these instructions at home: General instructions  Avoid any diets that cause you to not eat enough food. Talk with your health care provider before you start any new diet.  Take over-the-counter and prescription medicines only as told by your health care provider.  Limit alcohol intake to no more than 1 drink per day for nonpregnant women and 2 drinks per day for men. One drink equals 12 oz of beer, 5 oz of wine, or 1 oz of hard liquor.  Keep all follow-up visits as told  by your health care provider. This is important. If You Have Diabetes:   Make sure you know the symptoms of hypoglycemia.  Always have a rapid-acting carbohydrate snack with you to treat low blood sugar.  Follow your diabetes management plan, as told by your health care provider. Make sure you: ? Take your medicines as directed. ? Follow your exercise plan. ? Follow your meal plan. Eat on time, and do not skip meals. ? Check your blood glucose as often as directed. Make sure to check your blood glucose before and after exercise. If you exercise longer or in a different way than usual, check your blood glucose more often. ? Follow your sick day plan whenever you cannot eat or drink normally. Make this plan in advance with your health care provider.  Share your diabetes management plan with people in your workplace, school, and household.  Check your urine for ketones when you are ill and as told by your health care provider.  Carry a medical alert card or wear medical alert jewelry. If You Have Reactive Hypoglycemia or Low Blood Sugar From Other Causes:  Monitor your blood glucose as told by your health care provider.  Follow instructions from your health care provider about eating or drinking restrictions. Contact a health care provider if:  You have problems keeping your blood glucose in your target range.  You have frequent episodes of hypoglycemia. Get help right away if:  You continue to have hypoglycemia symptoms after eating or drinking something containing glucose.  Your blood glucose is at or below 54 mg/dL (3 mmol/L).  You have a seizure.  You faint. These symptoms may represent a serious problem that is an emergency. Do not wait to see if the symptoms will go away. Get medical help right away. Call your local emergency services (911 in the [place] Do not drive yourself to the hospital. This information is not intended to replace advice given to you by your health care  provider. Make sure you discuss any questions you have with your health care provider. Document Released: 12/22/2004 Document Revised: 06/05/2015 Document Reviewed: 01/25/2015 Elsevier Interactive Patient Education  [employer].

## 2017-05-25 LAB — BASIC METABOLIC PANEL WITHOUT GFR
BUN/Creatinine Ratio: 20 (calc) (ref 6–22)
BUN: 42 mg/dL — ABNORMAL HIGH (ref 7–25)
CO2: 31 mmol/L (ref 20–32)
Calcium: 9.3 mg/dL (ref 8.6–10.3)
Chloride: 99 mmol/L (ref 98–110)
Creat: 2.13 mg/dL — ABNORMAL HIGH (ref 0.70–1.18)
GFR, Est African American: 33 mL/min/1.73m2 — ABNORMAL LOW
GFR, Est Non African American: 29 mL/min/1.73m2 — ABNORMAL LOW
Glucose, Bld: 450 mg/dL — ABNORMAL HIGH (ref 65–99)
Potassium: 3.7 mmol/L (ref 3.5–5.3)
Sodium: 137 mmol/L (ref 135–146)

## 2017-05-25 LAB — CBC WITH DIFFERENTIAL/PLATELET
Basophils Absolute: 42 {cells}/uL (ref 0–200)
Basophils Relative: 0.6 %
Eosinophils Absolute: 112 {cells}/uL (ref 15–500)
Eosinophils Relative: 1.6 %
HCT: 35.4 % — ABNORMAL LOW (ref 38.5–50.0)
Hemoglobin: 12 g/dL — ABNORMAL LOW (ref 13.2–17.1)
Lymphs Abs: 679 {cells}/uL — ABNORMAL LOW (ref 850–3900)
MCH: 32.1 pg (ref 27.0–33.0)
MCHC: 33.9 g/dL (ref 32.0–36.0)
MCV: 94.7 fL (ref 80.0–100.0)
MPV: 9 fL (ref 7.5–12.5)
Monocytes Relative: 7.7 %
Neutro Abs: 5628 {cells}/uL (ref 1500–7800)
Neutrophils Relative %: 80.4 %
Platelets: 196 Thousand/uL (ref 140–400)
RBC: 3.74 Million/uL — ABNORMAL LOW (ref 4.20–5.80)
RDW: 14.8 % (ref 11.0–15.0)
Total Lymphocyte: 9.7 %
WBC mixed population: 539 {cells}/uL (ref 200–950)
WBC: 7 Thousand/uL (ref 3.8–10.8)

## 2017-05-25 LAB — PROTIME-INR
INR: 2.5 — AB
PROTHROMBIN TIME: 26.4 s — AB (ref 9.0–11.5)

## 2017-06-22 NOTE — Progress Notes (Signed)
Assessment and Plan:  1. PAF (paroxysmal atrial fibrillation) (HCC) Check INR and will adjust medication according to labs.  Discussed if patient falls to immediately contact office or go to ER. Discussed foods that can increase or decrease Coumadin levels. Patient understands to call the office before starting a new medication. Follow up in one month.   2. Chronic diastolic congestive heart failure (HCC) Weights stable Emphasized salt restriction, less than 2014m a day. Encouraged daily monitoring of the patient's weight, call office if 5 lb weight loss or gain in a day.  Encouraged regular exercise. If any increasing shortness of breath, swelling, or chest pressure go to ER immediately.  decrease your fluid intake to less than 2 L daily please remember to always increase your potassium intake with any increase of your fluid pill.   3. CKD (chronic kidney disease) stage 3, GFR 30-59 ml/min (HCC) Increase fluids, avoid NSAIDS, monitor sugars, will monitor BMP/GFR  4. Anemia, unspecified type CBC  5. Diabetes Doing well on insulin - taking 15 units AM, 10 units PM -up to to 32 units and 27 units - given instructions to taper up by 2-3 units every 3 days for sugars consistently above goal follow up closely in 1 month Information for hypoglycemia management discussed and provided.  Keep a glucose log and present at next visit    Further disposition pending results of labs. Discussed med's effects and SE's.   Over 30 minutes of exam, counseling, chart review, and critical decision making was performed.   Future Appointments  Date Time Provider [place] 07/27/2017  3:45 PM CLiane Comber NP [facility] None  08/26/2017  8:15 AM MHayden Pedro MD [facility] None  05/18/2018  2:00 PM MUnk Pinto MD [facility] None    ------------------------------------------------------------------------------------------------------------------   HPI BP 116/62   Pulse 69   Temp  (!) 97.5 F (36.4 C)   Ht 5' 11"  (1.803 m)   Wt 193 lb (87.5 kg)   SpO2 97%   BMI 26.92 kg/m   77 y.o.male presents for follow up on coumadin, CHF, CKD III and anemia.   His blood pressure has been controlled at home, today their BP is BP: 116/62  He does not workout. He denies chest pain, shortness of breath, dizziness.  He has a history of Diastolic CHF, denies dyspnea on exertion, orthopnea, paroxysmal nocturnal dyspnea and edema. Positive for none.  Wt Readings from Last 3 Encounters:  06/23/17 193 lb (87.5 kg)  05/24/17 185 lb 3.2 oz (84 kg)  05/10/17 186 lb 9.6 oz (84.6 kg)   Patient is on Coumadin for PAF (paroxysmal atrial fibrillation) (HCC) [I48.0] Patient's last INR is  Lab Results  Component Value Date   INR 2.5 (H) 05/24/2017   INR 2.6 (H) 04/21/2017   INR 1.16 03/05/2017    Patient denies SOB, CP, dizziness, easy bleeding, and blood in stool/urine. He does endorse spontaneous nose bleed this AM; has had these previously all your life but not typically on left nostril - typically has had in right. His coumadin dose was not changed last visit. He has not taken ABX, has not missed any doses and denies a fall.    Current dose: 2 tabs of 5 mg 6 days a week, then 1.5 tabs 1 day a week.    He has been working on diet and exercise (as tolerated) for T2 diabetes, and denies foot ulcerations, hypoglycemia , increased appetite, nausea, paresthesia of the feet, polydipsia, polyuria, visual disturbances, vomiting and  weight loss. He is gradually increasing insulin, currently up to 32 units AM, 27 units PM. Last A1C in the office was:  Lab Results  Component Value Date   HGBA1C 10.7 (H) 04/21/2017      Past Medical History:  Diagnosis Date  . Adrenal adenoma   . Anemia   . Anxiety   . Arthritis   . Atypical atrial flutter ([place]) 8/15, 10/15   a. DCCV 08/2013. b. s/p RFA 10/2013.  [name] Basal cell carcinoma   . CAD (coronary artery disease)    a. 04/2013 CABG x 2: LIMA to  LAD, SVG to RI, EVH via R thigh.  . Cellulitis 12/2015   left leg  . Chronic diastolic congestive heart failure ([place])   . CKD (chronic kidney disease), stage III ([place])   . Depression   . Diabetes mellitus type II   . Diverticulosis 2001  . DJD (degenerative joint disease)   . GERD (gastroesophageal reflux disease)   . Gout   . H/O hiatal hernia   . History of cardioversion    x3 (years uncertain)  . Hx of adenomatous colonic polyps   . Hyperlipidemia   . Hypertension   . Hypertensive cardiomyopathy ([place])   . Obstructive sleep apnea    compliant with CPAP  . 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 Persistent atrial fibrillation ([place])    a. s/p MAZE 04/2013 in setting of CABG. b. Amio stopped in 10/2013 after flutter ablation.  [name] PFO (patent foramen ovale)    a. Small PFO by TEE 10/2013.  [name] Pleural effusion, left    a. s/p thoracentesis 05/2013.  [name] Respiratory failure ([place])    a. Hypoxia 10/2013 - required supp O2 as inpatient, did not require it at discharge.  . S/P Maze operation for atrial fibrillation    a. 04/2013: Complete bilateral atrial lesion set using cryothermy and bipolar radiofrequency ablation with clipping of LA appendage (@ time of CABG)  . S/P Maze operation for atrial fibrillation 04/05/2013   Complete bilateral atrial lesion set using cryothermy and bipolar radiofrequency ablation with clipping of LA appendage via median sternotomy approach      Allergies  Allergen Reactions  . Sunflower Seed [Sunflower Oil] Swelling and Other (See Comments)    Tongue and lip swelling  . Horse-Derived Products Other (See Comments)    Per allergy skin test UNSPECIFIED REACTION   . Tetanus Toxoids Other (See Comments)    Per allergy skin test  . Tetanus Toxoid     Other reaction(s): Other (See Comments) Rash(horse serum)    Current Outpatient Medications on File  Prior to Visit  Medication Sig  . ACCU-CHEK AVIVA PLUS test strip CHECK BLOOD GLUCOSE 3 TIMES DAILY.  [name] acetaminophen (TYLENOL) 500 MG tablet Take 1,000 mg by mouth every 6 (six) hours as needed for moderate pain or headache.   . allopurinol (ZYLOPRIM) 300 MG tablet Take 1 tablet daily to prevent gout (Patient taking differently: Take 300 mg by mouth daily. )  . ALPRAZolam (XANAX) 1 MG tablet Take 0.5 tablets (0.5 mg total) by mouth at bedtime as needed for sleep.  [name] Alum Hydroxide-Mag Carbonate (GAVISCON PO) Take 4 tablets by mouth daily as needed (acid reflux).   [name] aspirin EC 81 MG tablet Take 81 mg by mouth daily.  [name] atorvastatin (LIPITOR) 40 MG tablet Take 20 mg by mouth every Monday, Wednesday, and  Friday.   [name] azelastine (ASTELIN) 0.1 % nasal spray PLACE 2 SPRAYS INTO BOTH NOSTRILS 2 (TWO) TIMES DAILY. USE IN EACH NOSTRIL AS DIRECTED  . B Complex Vitamins (VITAMIN B COMPLEX PO) Take 1 tablet by mouth at bedtime.   . bacitracin 500 UNIT/GM ointment Apply 1 application topically daily as needed for wound care.  . Blood Glucose Monitoring Suppl (ACCU-CHEK AVIVA PLUS) w/Device KIT Check blood sugar 1 time  daily  . Blood Glucose Monitoring Suppl DEVI Test blood sugar up to three times a day or as directed.  . carvedilol (COREG) 6.25 MG tablet TAKE 1 TABLET BY MOUTH TWO  TIMES DAILY (Patient taking differently: TAKE 6.25 MG BY MOUTH TWO  TIMES DAILY)  . Cholecalciferol (VITAMIN D PO) Take 5,000 Units by mouth 2 (two) times daily.  . colchicine 0.6 MG tablet Take 0.6 mg by mouth daily as needed (for gout flare up).   . diphenhydrAMINE (BENADRYL) 25 MG tablet Take 25 mg by mouth at bedtime as needed for allergies.   . ferrous sulfate 325 (65 FE) MG tablet Take 325 mg by mouth 2 (two) times daily with a meal.  . fluticasone (FLONASE) 50 MCG/ACT nasal spray Place 1 spray into both nostrils daily as needed for allergies.   [name] HYDROcodone-acetaminophen (NORCO/VICODIN) 5-325 MG tablet Take 1-2 tablets by  mouth every 6 (six) hours as needed for moderate pain.   [name] insulin NPH-regular Human (NOVOLIN 70/30) (70-30) 100 UNIT/ML injection Inject into the skin.  [name] insulin NPH-regular Human (NOVOLIN 70/30) (70-30) 100 UNIT/ML injection Inject 25 Units into the skin daily. 15 units AM, 10 units PM  . lidocaine (LIDODERM) 5 % Place 1 patch onto the skin daily as needed (for pain). Remove & Discard patch within 12 hours or as directed by MD   . loratadine (CLARITIN) 10 MG tablet Take 10 mg by mouth daily as needed for allergies.   . metolazone (ZAROXOLYN) 5 MG tablet Take 5 mg by mouth daily as needed (weight gain and edema).  . Multiple Vitamin (MULTIVITAMIN WITH MINERALS) TABS tablet Take 1 tablet by mouth at bedtime.   . Multiple Vitamins-Minerals (PRESERVISION AREDS 2) CAPS Take 1 capsule by mouth 2 (two) times daily.   . polyvinyl alcohol (ARTIFICIAL TEARS) 1.4 % ophthalmic solution Place 1 drop into both eyes daily as needed for dry eyes.  . potassium chloride SA (K-DUR,KLOR-CON) 20 MEQ tablet Take 20 mEq by mouth 3 (three) times daily.  [name] PRESCRIPTION MEDICATION Inhale into the lungs at bedtime. CPAP  . ranitidine (ZANTAC) 150 MG tablet Take 150 mg by mouth daily as needed for heartburn.   . sertraline (ZOLOFT) 100 MG tablet TAKE 1 TABLET BY MOUTH  DAILY  . torsemide (DEMADEX) 100 MG tablet Take 100 mg in AM and 50 mg in PM if needed for weight gain (Patient taking differently: Take 50-100 mg by mouth See admin instructions. TAKE 1 TABLET (100 MG) BY MOUTH SCHEDULED IN THE MORNING, AND MAY TAKE 0.5 TABLET (50 MG) IN THE EVENING IF NEEDED FOR SWELLING/WEIGHT GAIN)  . valsartan (DIOVAN) 320 MG tablet Take 0.5 tablets (160 mg total) by mouth daily.  [name] warfarin (COUMADIN) 5 MG tablet Take 7.5-10 mg by mouth See admin instructions. Take 7.5 mg by mouth daily on Sunday. Take 10 mg by mouth daily on all other days   No current facility-administered medications on file prior to visit.     ROS: Review of  Systems  Constitutional: Negative for malaise/fatigue and weight  loss.  HENT: Negative for hearing loss and tinnitus.   Eyes: Negative for blurred vision and double vision.  Respiratory: Negative for cough, shortness of breath and wheezing.   Cardiovascular: Negative for chest pain, palpitations, orthopnea, claudication and leg swelling.  Gastrointestinal: Negative for abdominal pain, blood in stool, constipation, diarrhea, heartburn, melena, nausea and vomiting.  Genitourinary: Negative.   Musculoskeletal: Negative for joint pain and myalgias.  Skin: Negative for rash.  Neurological: Negative for dizziness, tingling, sensory change, weakness and headaches.  Endo/Heme/Allergies: Negative for polydipsia.  Psychiatric/Behavioral: Negative.   All other systems reviewed and are negative.    Physical Exam:  BP 116/62   Pulse 69   Temp (!) 97.5 F (36.4 C)   Ht 5' 11"  (1.803 m)   Wt 193 lb (87.5 kg)   SpO2 97%   BMI 26.92 kg/m   General Appearance: Well nourished, in no apparent distress. Eyes: PERRLA, EOMs, conjunctiva no swelling or erythema Sinuses: No Frontal/maxillary tenderness ENT/Mouth: Ext aud canals clear, TMs without erythema, bulging. No erythema, swelling, or exudate on post pharynx.  Tonsils not swollen or erythematous. Hearing normal.  Neck: Supple, thyroid normal.  Respiratory: Respiratory effort normal, BS equal bilaterally without rales, rhonchi, wheezing or stridor.  Cardio: RRR with no MRGs. Brisk peripheral pulses without edema.  Abdomen: Soft, + BS.  Non tender, no guarding, rebound, hernias, masses. Lymphatics: Non tender without lymphadenopathy.  Musculoskeletal: Full ROM, 5/5 strength, normal gait.  Skin: Warm, dry without rashes, lesions, ecchymosis.  Neuro: Cranial nerves intact. Normal muscle tone, no cerebellar symptoms. Sensation intact.  Psych: Awake and oriented X 3, normal affect, Insight and Judgment appropriate.     Izora Ribas, NP 3:26  PM [facility]

## 2017-06-23 ENCOUNTER — Ambulatory Visit (INDEPENDENT_AMBULATORY_CARE_PROVIDER_SITE_OTHER): Payer: Medicare Other | Admitting: Adult Health

## 2017-06-23 ENCOUNTER — Encounter: Payer: Self-pay | Admitting: Adult Health

## 2017-06-23 VITALS — BP 116/62 | HR 69 | Temp 97.5°F | Ht 71.0 in | Wt 193.0 lb

## 2017-06-23 DIAGNOSIS — Z79899 Other long term (current) drug therapy: Secondary | ICD-10-CM

## 2017-06-23 DIAGNOSIS — I5032 Chronic diastolic (congestive) heart failure: Secondary | ICD-10-CM

## 2017-06-23 DIAGNOSIS — I10 Essential (primary) hypertension: Secondary | ICD-10-CM | POA: Diagnosis not present

## 2017-06-23 DIAGNOSIS — N183 Chronic kidney disease, stage 3 unspecified: Secondary | ICD-10-CM

## 2017-06-23 DIAGNOSIS — E1122 Type 2 diabetes mellitus with diabetic chronic kidney disease: Secondary | ICD-10-CM

## 2017-06-23 DIAGNOSIS — D509 Iron deficiency anemia, unspecified: Secondary | ICD-10-CM

## 2017-06-23 DIAGNOSIS — I480 Paroxysmal atrial fibrillation: Secondary | ICD-10-CM | POA: Diagnosis not present

## 2017-06-23 NOTE — Patient Instructions (Signed)
Aim for 7+ servings of fruits and vegetables daily  80+ fluid ounces of water or unsweet tea for healthy kidneys  Limit animal fats in diet for cholesterol and heart health - choose grass fed whenever available  Aim for low stress - take time to unwind and care for your mental health  Aim for 150 min of moderate intensity exercise weekly for heart health, and weights twice weekly for bone health  Aim for 7-9 hours of sleep daily      When it comes to diets, agreement about the perfect plan isn't easy to find, even among the experts. Experts at the [employer] developed an idea known as the Healthy Eating Plate. Just imagine a plate divided into logical, healthy portions.  The emphasis is on diet quality:  Load up on vegetables and fruits - one-half of your plate: Aim for color and variety, and remember that potatoes don't count.  Go for whole grains - one-quarter of your plate: Whole wheat, barley, wheat berries, quinoa, oats, brown rice, and foods made with them. If you want pasta, go with whole wheat pasta.  Protein power - one-quarter of your plate: Fish, chicken, beans, and nuts are all healthy, versatile protein sources. Limit red meat.  The diet, however, does go beyond the plate, offering a few other suggestions.  Use healthy plant oils, such as olive, canola, soy, corn, sunflower and peanut. Check the labels, and avoid partially hydrogenated oil, which have unhealthy trans fats.  If you're thirsty, drink water. Coffee and tea are good in moderation, but skip sugary drinks and limit milk and dairy products to one or two daily servings.  The type of carbohydrate in the diet is more important than the amount. Some sources of carbohydrates, such as vegetables, fruits, whole grains, and beans-are healthier than others.  Finally, stay active.   

## 2017-06-24 ENCOUNTER — Ambulatory Visit: Payer: Self-pay | Admitting: Adult Health

## 2017-07-06 DIAGNOSIS — M1990 Unspecified osteoarthritis, unspecified site: Secondary | ICD-10-CM | POA: Diagnosis not present

## 2017-07-06 DIAGNOSIS — N189 Chronic kidney disease, unspecified: Secondary | ICD-10-CM | POA: Diagnosis not present

## 2017-07-06 DIAGNOSIS — M109 Gout, unspecified: Secondary | ICD-10-CM | POA: Diagnosis not present

## 2017-07-06 DIAGNOSIS — M79643 Pain in unspecified hand: Secondary | ICD-10-CM | POA: Diagnosis not present

## 2017-07-06 DIAGNOSIS — E790 Hyperuricemia without signs of inflammatory arthritis and tophaceous disease: Secondary | ICD-10-CM | POA: Diagnosis not present

## 2017-07-26 NOTE — Progress Notes (Signed)
FOLLOW UP  Assessment and Plan:   Diagnoses and all orders for this visit:  PAF (paroxysmal atrial fibrillation) ([place]) Check INR and will adjust medication according to labs.  Discussed if patient falls to immediately contact office or go to ER. Discussed foods that can increase or decrease Coumadin levels. Patient understands to call the office before starting a new medication. Follow up in one month.   Senile purpura (HCC) Fragile secondary to age; monitor closely for excessive bleeding r/t anticoagulation  CHF Stable Emphasized salt restriction, less than 2093m a day. Encouraged daily monitoring of the patient's weight, call office if 5 lb weight loss or gain in a day.  Encouraged regular exercise. If any increasing shortness of breath, swelling, or chest pressure go to ER immediately.  decrease your fluid intake to less than 2 L daily please remember to always increase your potassium intake with any increase of your fluid pill.   CKD (chronic kidney disease) stage 3, GFR 30-59 ml/min (HCC) Increase fluids, avoid NSAIDS, monitor sugars, will monitor  Hypertension Well controlled with current medications  Monitor blood pressure at home; patient to call if consistently greater than 130/80 Continue DASH diet.   Reminder to go to the ER if any CP, SOB, nausea, dizziness, severe HA, changes vision/speech, left arm numbness and tingling and jaw pain.  Cholesterol Continue medication  Continue low cholesterol diet and exercise.  Check lipid panel.   Controlled type 2 diabetes mellitus with stage 3 chronic kidney disease, without long-term current use of insulin (HCC) Continue medications; recently switched back to insulin for poor control - increase AM units to 37 units x 3 days then 40 units, continue PM 35 units Continue diet and exercise.  Perform daily foot/skin check, notify office of any concerning changes.  Check A1C  Vitamin D Def/ osteoporosis prevention Continue  supplementation Check Vit D level  Recurrent major depressive disorder, in full remission ([place] Well managed with current regimen; Continue medications  Lifestyle discussed: diet/exerise, sleep hygiene, stress management, hydration  Continue diet and meds as discussed. Further disposition pending results of labs. Discussed med's effects and SE's.   Over 30 minutes of exam, counseling, chart review, and critical decision making was performed.   Future Appointments  Date Time Provider [place] 08/26/2017  8:15 AM MHayden Pedro MD [facility] None  05/18/2018  2:00 PM MUnk Pinto MD [facility] None    ----------------------------------------------------------------------------------------------------------------------  HPI 77y.o. male  presents for 3 month follow up on chronic medical conditions - htn, CHF, T2DM, hyperlipidemia, CKD stage 3, and recurrent major depression currently in remission on treatment, and 4 week follow up for coumadin monitoring.  The patient is s/p CABG & MAZE procedure in 2015; has also had RF Ablation for Afib, followed by Dr BMissy Sabinsin the HF clinic. 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  He does admit exertional dyspnea with moderate activity.   he has a diagnosis of recurrent major depression and is currently on zoloft 100 mg, reports symptoms are well controlled on current regimen. He is also prescribed xanax 0.5-1 mg PRN, can't recall when he last took it.   Patient is on Coumadin for PAF (paroxysmal atrial fibrillation) ([place] [I48.0] Patient's last INR is  Lab Results  Component Value Date   INR 3.0 (H) 06/23/2017   INR 2.5 (H) 05/24/2017   INR 2.6 (H) 04/21/2017    Patient denies SOB, CP, dizziness, nose bleeds, easy bleeding, and blood in stool/urine. His coumadin  dose was not changed last visit. He has not taken ABX, has not missed any doses and denies a fall.    Current dose: 2 tabs of 5 mg 6 days a week, then  1.5 tabs 1 day a week.   BMI is Body mass index is 26.64 kg/m., he has been working on diet (cutting out bread, excessive carbs) and exercise, he walks 20+ min daily.  Wt Readings from Last 3 Encounters:  07/27/17 191 lb (86.6 kg)  06/23/17 193 lb (87.5 kg)  05/24/17 185 lb 3.2 oz (84 kg)   His blood pressure has been controlled at home, today their BP is BP: 100/66  He does not workout. He denies chest pain, dizziness. He endorses some SOB with exertion.    He is on cholesterol medication and denies myalgias. His LDL cholesterol is at goal; trigs remain elevated. The cholesterol last visit was:   Lab Results  Component Value Date   CHOL 134 04/21/2017   HDL 26 (L) 04/21/2017   LDLCALC 71 04/21/2017   TRIG 308 (H) 04/21/2017   CHOLHDL 5.2 (H) 04/21/2017    He has been working on diet and exercise for type 2 diabetes (treated by novolin 70/30, taking 34 units AM, 35 units PM), and denies increased appetite, nausea, paresthesia of the feet, polydipsia, polyuria, visual disturbances and vomiting. He does check fasting AM glucose, log ranges 105-250, highly irregular, evening sugars range high 200s to 400s with rare outliers. He did have 1 episode of hypoglycemia at 44 - took glucose tab.  Last A1C in the office was:  Lab Results  Component Value Date   HGBA1C 10.7 (H) 04/21/2017   Patient is on Vitamin D supplementt:    Lab Results  Component Value Date   VD25OH 55 04/21/2017         Current Medications:  Current Outpatient Medications on File Prior to Visit  Medication Sig  . ACCU-CHEK AVIVA PLUS test strip CHECK BLOOD GLUCOSE 3 TIMES DAILY.  [name] acetaminophen (TYLENOL) 500 MG tablet Take 1,000 mg by mouth every 6 (six) hours as needed for moderate pain or headache.   . allopurinol (ZYLOPRIM) 300 MG tablet Take 1 tablet daily to prevent gout (Patient taking differently: Take 300 mg by mouth daily. )  . ALPRAZolam (XANAX) 1 MG tablet Take 0.5 tablets (0.5 mg total) by mouth at  bedtime as needed for sleep.  [name] Alum Hydroxide-Mag Carbonate (GAVISCON PO) Take 4 tablets by mouth daily as needed (acid reflux).   [name] aspirin EC 81 MG tablet Take 81 mg by mouth daily.  [name] atorvastatin (LIPITOR) 40 MG tablet Take 20 mg by mouth every Monday, Wednesday, and Friday.   [name] azelastine (ASTELIN) 0.1 % nasal spray PLACE 2 SPRAYS INTO BOTH NOSTRILS 2 (TWO) TIMES DAILY. USE IN EACH NOSTRIL AS DIRECTED  . B Complex Vitamins (VITAMIN B COMPLEX PO) Take 1 tablet by mouth at bedtime.   . bacitracin 500 UNIT/GM ointment Apply 1 application topically daily as needed for wound care.  . Blood Glucose Monitoring Suppl (ACCU-CHEK AVIVA PLUS) w/Device KIT Check blood sugar 1 time  daily  . Blood Glucose Monitoring Suppl DEVI Test blood sugar up to three times a day or as directed.  . carvedilol (COREG) 6.25 MG tablet TAKE 1 TABLET BY MOUTH TWO  TIMES DAILY (Patient taking differently: TAKE 6.25 MG BY MOUTH TWO  TIMES DAILY)  . Cholecalciferol (VITAMIN D PO) Take 5,000 Units by mouth 2 (two) times daily.  [name]  colchicine 0.6 MG tablet Take 0.6 mg by mouth daily as needed (for gout flare up).   . diphenhydrAMINE (BENADRYL) 25 MG tablet Take 25 mg by mouth at bedtime as needed for allergies.   . ferrous sulfate 325 (65 FE) MG tablet Take 325 mg by mouth 2 (two) times daily with a meal.  . fluticasone (FLONASE) 50 MCG/ACT nasal spray Place 1 spray into both nostrils daily as needed for allergies.   [name] HYDROcodone-acetaminophen (NORCO/VICODIN) 5-325 MG tablet Take 1-2 tablets by mouth every 6 (six) hours as needed for moderate pain.   [name] insulin NPH-regular Human (NOVOLIN 70/30) (70-30) 100 UNIT/ML injection Inject into the skin.  [name] insulin NPH-regular Human (NOVOLIN 70/30) (70-30) 100 UNIT/ML injection Inject 25 Units into the skin daily. 15 units AM, 10 units PM  . lidocaine (LIDODERM) 5 % Place 1 patch onto the skin daily as needed (for pain). Remove & Discard patch within 12 hours or as directed by MD   .  loratadine (CLARITIN) 10 MG tablet Take 10 mg by mouth daily as needed for allergies.   . metolazone (ZAROXOLYN) 5 MG tablet Take 5 mg by mouth daily as needed (weight gain and edema).  . Multiple Vitamin (MULTIVITAMIN WITH MINERALS) TABS tablet Take 1 tablet by mouth at bedtime.   . Multiple Vitamins-Minerals (PRESERVISION AREDS 2) CAPS Take 1 capsule by mouth 2 (two) times daily.   . polyvinyl alcohol (ARTIFICIAL TEARS) 1.4 % ophthalmic solution Place 1 drop into both eyes daily as needed for dry eyes.  . potassium chloride SA (K-DUR,KLOR-CON) 20 MEQ tablet Take 20 mEq by mouth 3 (three) times daily.  [name] PRESCRIPTION MEDICATION Inhale into the lungs at bedtime. CPAP  . ranitidine (ZANTAC) 150 MG tablet Take 150 mg by mouth daily as needed for heartburn.   . sertraline (ZOLOFT) 100 MG tablet TAKE 1 TABLET BY MOUTH  DAILY  . torsemide (DEMADEX) 100 MG tablet Take 100 mg in AM and 50 mg in PM if needed for weight gain (Patient taking differently: Take 50-100 mg by mouth See admin instructions. TAKE 1 TABLET (100 MG) BY MOUTH SCHEDULED IN THE MORNING, AND MAY TAKE 0.5 TABLET (50 MG) IN THE EVENING IF NEEDED FOR SWELLING/WEIGHT GAIN)  . valsartan (DIOVAN) 320 MG tablet Take 0.5 tablets (160 mg total) by mouth daily.  [name] warfarin (COUMADIN) 5 MG tablet Take 7.5-10 mg by mouth See admin instructions. Take 7.5 mg by mouth daily on Sunday. Take 10 mg by mouth daily on all other days   No current facility-administered medications on file prior to visit.      Allergies:  Allergies  Allergen Reactions  . Sunflower Seed [Sunflower Oil] Swelling and Other (See Comments)    Tongue and lip swelling  . Horse-Derived Products Other (See Comments)    Per allergy skin test UNSPECIFIED REACTION   . Tetanus Toxoids Other (See Comments)    Per allergy skin test  . Tetanus Toxoid     Other reaction(s): Other (See Comments) Rash(horse serum)     Medical History:  Past Medical History:  Diagnosis Date  .  Adrenal adenoma   . Anemia   . Anxiety   . Arthritis   . Atypical atrial flutter ([place]) 8/15, 10/15   a. DCCV 08/2013. b. s/p RFA 10/2013.  [name] Basal cell carcinoma   . CAD (coronary artery disease)    a. 04/2013 CABG x 2: LIMA to LAD, SVG to RI, EVH via R thigh.  . Cellulitis 12/2015  left leg  . Chronic diastolic congestive heart failure ([place])   . CKD (chronic kidney disease), stage III ([place])   . Depression   . Diabetes mellitus type II   . Diverticulosis 2001  . DJD (degenerative joint disease)   . GERD (gastroesophageal reflux disease)   . Gout   . H/O hiatal hernia   . History of cardioversion    x3 (years uncertain)  . Hx of adenomatous colonic polyps   . Hyperlipidemia   . Hypertension   . Hypertensive cardiomyopathy ([place])   . Obstructive sleep apnea    compliant with CPAP  . 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 Persistent atrial fibrillation ([place])    a. s/p MAZE 04/2013 in setting of CABG. b. Amio stopped in 10/2013 after flutter ablation.  [name] PFO (patent foramen ovale)    a. Small PFO by TEE 10/2013.  [name] Pleural effusion, left    a. s/p thoracentesis 05/2013.  [name] Respiratory failure ([place])    a. Hypoxia 10/2013 - required supp O2 as inpatient, did not require it at discharge.  . S/P Maze operation for atrial fibrillation    a. 04/2013: Complete bilateral atrial lesion set using cryothermy and bipolar radiofrequency ablation with clipping of LA appendage (@ time of CABG)  . S/P Maze operation for atrial fibrillation 04/05/2013   Complete bilateral atrial lesion set using cryothermy and bipolar radiofrequency ablation with clipping of LA appendage via median sternotomy approach    Family history- Reviewed and unchanged Social history- Reviewed and unchanged   Review of Systems:  Review of Systems  Constitutional: Negative for malaise/fatigue and weight loss.   HENT: Negative for hearing loss and tinnitus.   Eyes: Negative for blurred vision and double vision.  Respiratory: Positive for shortness of breath (chronic respiratory failure). Negative for cough and wheezing.   Cardiovascular: Negative for chest pain, palpitations, orthopnea, claudication and leg swelling.  Gastrointestinal: Negative for abdominal pain, blood in stool, constipation, diarrhea, heartburn, melena, nausea and vomiting.  Genitourinary: Negative.   Musculoskeletal: Negative for joint pain and myalgias.  Skin: Negative for rash.  Neurological: Negative for dizziness, tingling, sensory change, weakness and headaches.  Endo/Heme/Allergies: Negative for polydipsia.  Psychiatric/Behavioral: Negative.   All other systems reviewed and are negative.   Physical Exam: BP 100/66   Pulse 63   Temp (!) 97.5 F (36.4 C)   Ht 5' 11" (1.803 m)   Wt 191 lb (86.6 kg)   SpO2 97%   BMI 26.64 kg/m  Wt Readings from Last 3 Encounters:  07/27/17 191 lb (86.6 kg)  06/23/17 193 lb (87.5 kg)  05/24/17 185 lb 3.2 oz (84 kg)   General Appearance: Well nourished, in no apparent distress. Eyes: PERRLA, EOMs, conjunctiva no swelling or erythema Sinuses: No Frontal/maxillary tenderness ENT/Mouth: Ext aud canals clear, TMs without erythema, bulging. No erythema, swelling, or exudate on post pharynx.  Tonsils not swollen or erythematous. Hearing normal.  Neck: Supple, thyroid normal.  Respiratory: Respiratory effort normal, BS equal bilaterally without rales, rhonchi, wheezing or stridor.  Cardio: Rate irregular with no murmurs. 1+ peripheral pulses without edema.  Abdomen: Soft, + BS.  Non tender, no guarding, rebound, hernias, masses. Lymphatics: Non tender without lymphadenopathy.  Musculoskeletal: Full ROM, 5/5 strength, Normal gait Skin: Warm, dry without rashes; scattered flat dark brown discoloration to neck and forearms - some scattered small bruises.  Neuro: Cranial nerves intact.  No  cerebellar symptoms.  Psych: Awake and oriented X 3, normal affect, Insight and Judgment appropriate.    Izora Ribas, NP 2:18 PM [facility]

## 2017-07-27 ENCOUNTER — Ambulatory Visit (INDEPENDENT_AMBULATORY_CARE_PROVIDER_SITE_OTHER): Payer: Medicare Other | Admitting: Adult Health

## 2017-07-27 ENCOUNTER — Encounter: Payer: Self-pay | Admitting: Adult Health

## 2017-07-27 VITALS — BP 100/66 | HR 63 | Temp 97.5°F | Ht 71.0 in | Wt 191.0 lb

## 2017-07-27 DIAGNOSIS — I5032 Chronic diastolic (congestive) heart failure: Secondary | ICD-10-CM | POA: Diagnosis not present

## 2017-07-27 DIAGNOSIS — D649 Anemia, unspecified: Secondary | ICD-10-CM

## 2017-07-27 DIAGNOSIS — E782 Mixed hyperlipidemia: Secondary | ICD-10-CM | POA: Diagnosis not present

## 2017-07-27 DIAGNOSIS — I480 Paroxysmal atrial fibrillation: Secondary | ICD-10-CM | POA: Diagnosis not present

## 2017-07-27 DIAGNOSIS — J984 Other disorders of lung: Secondary | ICD-10-CM | POA: Diagnosis not present

## 2017-07-27 DIAGNOSIS — E559 Vitamin D deficiency, unspecified: Secondary | ICD-10-CM

## 2017-07-27 DIAGNOSIS — N183 Chronic kidney disease, stage 3 unspecified: Secondary | ICD-10-CM

## 2017-07-27 DIAGNOSIS — M109 Gout, unspecified: Secondary | ICD-10-CM | POA: Diagnosis not present

## 2017-07-27 DIAGNOSIS — Z79899 Other long term (current) drug therapy: Secondary | ICD-10-CM

## 2017-07-27 DIAGNOSIS — I10 Essential (primary) hypertension: Secondary | ICD-10-CM

## 2017-07-27 DIAGNOSIS — E1122 Type 2 diabetes mellitus with diabetic chronic kidney disease: Secondary | ICD-10-CM

## 2017-07-27 DIAGNOSIS — F3342 Major depressive disorder, recurrent, in full remission: Secondary | ICD-10-CM | POA: Diagnosis not present

## 2017-07-27 NOTE — Patient Instructions (Addendum)
Increase AM insulin to 37 units then 40 units after 3 days.    Targets for Glucose Readings: Time of Check Usual Target for Most People  Before Meals  70-130  Two hours after meals  Less than 180  Bedtime  90-150   Why Should You Check Your Blood Glucose? -The A1C tells you how your diabetes is doing over a 3 month period.  Home blood glucose monitoring (or checking) gives you information about your diabetes on a daily basis.  You will learn how well your diabetes care plan is working and whether your blood glucose is in your target range throughout the day.   -Reviewing daily blood glucose levels will help you and your healthcare team make any needed changes to you meal plan, physical activity and medications.  Meter Supplies: - A glucose meter was provided at your visit today along with strips and lancets. The blood glucose meter strips and lancets are usually covered by health insurance. Please let us know if they are not and contact your insurance to see which meter they prefer.  How to get a good blood sample: -Wash your hands in warm water.  You do not need to use alcohol wipes. -Massage your hands. -Choose which finger you will use.  It helps to use a different finger each time to avoid soreness. -Keep you hand below your wrist when using you lancet to "prick" your finger. -Apply gentle pressure, but do NOT squeeze your finger.  Checking your blood glucose Important Reminders: -Make sure your strips are not expired.  Check the date on the bottle. -Make sure the code on bottle matches the code on your machine. -Make sure your hands are clean and dry. -Do not use the center of your finger, it is the most sensitive area.  Use a spot to the side of the center of your fingertip. -Completely fill the strip target area with blood (until it beeps) to make sure the results are accurate. -You will need a prescription to have your glucose strips and lancets covered by insurance. -There is  usually an 800 number on the back of your meter for help with meter issues.  How often to check: -If you take diabetes pills or take one injection of insulin each day, you will usually be asked to check twice a day, before breakfast and 2 hours after one meal, or as directed. -If you take several insulin injections each day, you will usually be asked to check four times a day before meals and at bedtime every day.      Preventing Type 2 Diabetes Mellitus Type 2 diabetes (type 2 diabetes mellitus) is a long-term (chronic) disease that affects blood sugar (glucose) levels. Normally, a hormone called insulin allows glucose to enter cells in the body. The cells use glucose for energy. In type 2 diabetes, one or both of these problems may be present:  The body does not make enough insulin.  The body does not respond properly to insulin that it makes (insulin resistance).  Insulin resistance or lack of insulin causes excess glucose to build up in the blood instead of going into cells. As a result, high blood glucose (hyperglycemia) develops, which can cause many complications. Being overweight or obese and having an inactive (sedentary) lifestyle can increase your risk for diabetes. Type 2 diabetes can be delayed or prevented by making certain nutrition and lifestyle changes. What nutrition changes can be made?  Eat healthy meals and snacks regularly. Keep a healthy  snack with you for when you get hungry between meals, such as fruit or a handful of nuts.  Eat lean meats and proteins that are low in saturated fats, such as chicken, fish, egg whites, and beans. Avoid processed meats.  Eat plenty of fruits and vegetables and plenty of grains that have not been processed (whole grains). It is recommended that you eat: ? 1?2 cups of fruit every day. ? 2?3 cups of vegetables every day. ? 6?8 oz of whole grains every day, such as oats, whole wheat, bulgur, brown rice, quinoa, and millet.  Eat  low-fat dairy products, such as milk, yogurt, and cheese.  Eat foods that contain healthy fats, such as nuts, avocado, olive oil, and canola oil.  Drink water throughout the day. Avoid drinks that contain added sugar, such as soda or sweet tea.  Follow instructions from your health care provider about specific eating or drinking restrictions.  Control how much food you eat at a time (portion size). ? Check food labels to find out the serving sizes of foods. ? Use a kitchen scale to weigh amounts of foods.  Saute or steam food instead of frying it. Cook with water or broth instead of oils or butter.  Limit your intake of: ? Salt (sodium). Have no more than 1 tsp (2,400 mg) of sodium a day. If you have heart disease or high blood pressure, have less than ? tsp (1,500 mg) of sodium a day. ? Saturated fat. This is fat that is solid at room temperature, such as butter or fat on meat. What lifestyle changes can be made?  Activity  Do moderate-intensity physical activity for at least 30 minutes on at least 5 days of the week, or as much as told by your health care provider.  Ask your health care provider what activities are safe for you. A mix of physical activities may be best, such as walking, swimming, cycling, and strength training.  Try to add physical activity into your day. For example: ? Park in spots that are farther away than usual, so that you walk more. For example, park in a far corner of the parking lot when you go to the office or the grocery store. ? Take a walk during your lunch break. ? Use stairs instead of elevators or escalators. Weight Loss  Lose weight as directed. Your health care provider can determine how much weight loss is best for you and can help you lose weight safely.  If you are overweight or obese, you may be instructed to lose at least 5?7 % of your body weight. Alcohol and Tobacco   Limit alcohol intake to no more than 1 drink a day for nonpregnant  women and 2 drinks a day for men. One drink equals 12 oz of beer, 5 oz of wine, or 1 oz of hard liquor.  Do not use any tobacco products, such as cigarettes, chewing tobacco, and e-cigarettes. If you need help quitting, ask your health care provider. Work With [place] Provider  Have your blood glucose tested regularly, as told by your health care provider.  Discuss your risk factors and how you can reduce your risk for diabetes.  Get screening tests as told by your health care provider. You may have screening tests regularly, especially if you have certain risk factors for type 2 diabetes.  Make an appointment with a diet and nutrition specialist (registered dietitian). A registered dietitian can help you make a healthy eating plan and  can help you understand portion sizes and food labels. Why are these changes important?  It is possible to prevent or delay type 2 diabetes and related health problems by making lifestyle and nutrition changes.  It can be difficult to recognize signs of type 2 diabetes. The best way to avoid possible damage to your body is to take actions to prevent the disease before you develop symptoms. What can happen if changes are not made?  Your blood glucose levels may keep increasing. Having high blood glucose for a long time is dangerous. Too much glucose in your blood can damage your blood vessels, heart, kidneys, nerves, and eyes.  You may develop prediabetes or type 2 diabetes. Type 2 diabetes can lead to many chronic health problems and complications, such as: ? Heart disease. ? Stroke. ? Blindness. ? Kidney disease. ? Depression. ? Poor circulation in the feet and legs, which could lead to surgical removal (amputation) in severe cases. Where to find support:  Ask your health care provider to recommend a registered dietitian, diabetes educator, or weight loss program.  Look for local or online weight loss groups.  Join a gym, fitness club, or  outdoor activity group, such as a walking club. Where to find more information: To learn more about diabetes and diabetes prevention, visit:  American Diabetes Association (ADA): www.diabetes.[employer] of Diabetes and Digestive and Kidney Diseases: FindSpin.nl  To learn more about healthy eating, visit:  The U.S. Department of Agriculture Scientist, research (physical sciences)), Choose My Plate: http://wiley-williams.com/  Office of Disease Prevention and Health Promotion (ODPHP), Dietary Guidelines: SurferLive.at  Summary  You can reduce your risk for type 2 diabetes by increasing your physical activity, eating healthy foods, and losing weight as directed.  Talk with your health care provider about your risk for type 2 diabetes. Ask about any blood tests or screening tests that you need to have. This information is not intended to replace advice given to you by your health care provider. Make sure you discuss any questions you have with your health care provider. Document Released: 04/15/2015 Document Revised: 05/30/2015 Document Reviewed: 02/12/2015 Elsevier Interactive Patient Education  Javonni Schein.

## 2017-07-28 LAB — COMPLETE METABOLIC PANEL WITH GFR
AG RATIO: 1.5 (calc) (ref 1.0–2.5)
ALKALINE PHOSPHATASE (APISO): 116 U/L — AB (ref 40–115)
ALT: 20 U/L (ref 9–46)
AST: 16 U/L (ref 10–35)
Albumin: 4.6 g/dL (ref 3.6–5.1)
BILIRUBIN TOTAL: 0.8 mg/dL (ref 0.2–1.2)
BUN/Creatinine Ratio: 23 (calc) — ABNORMAL HIGH (ref 6–22)
BUN: 57 mg/dL — ABNORMAL HIGH (ref 7–25)
CHLORIDE: 93 mmol/L — AB (ref 98–110)
CO2: 33 mmol/L — ABNORMAL HIGH (ref 20–32)
Calcium: 9.7 mg/dL (ref 8.6–10.3)
Creat: 2.49 mg/dL — ABNORMAL HIGH (ref 0.70–1.18)
GFR, EST AFRICAN AMERICAN: 28 mL/min/{1.73_m2} — AB (ref >=60)
GFR, Est Non African American: 24 mL/min/{1.73_m2} — ABNORMAL LOW (ref >=60)
Globulin: 3 g/dL (calc) (ref 1.9–3.7)
Glucose, Bld: 268 mg/dL — ABNORMAL HIGH (ref 65–99)
POTASSIUM: 3.5 mmol/L (ref 3.5–5.3)
Sodium: 138 mmol/L (ref 135–146)
Total Protein: 7.6 g/dL (ref 6.1–8.1)

## 2017-07-28 LAB — CBC WITH DIFFERENTIAL/PLATELET
BASOS ABS: 37 {cells}/uL (ref 0–200)
Basophils Relative: 0.4 %
EOS ABS: 120 {cells}/uL (ref 15–500)
Eosinophils Relative: 1.3 %
HCT: 38.5 % (ref 38.5–50.0)
HEMOGLOBIN: 13.3 g/dL (ref 13.2–17.1)
Lymphs Abs: 892 cells/uL (ref 850–3900)
MCH: 31.8 pg (ref 27.0–33.0)
MCHC: 34.5 g/dL (ref 32.0–36.0)
MCV: 92.1 fL (ref 80.0–100.0)
MPV: 9.1 fL (ref 7.5–12.5)
Monocytes Relative: 6.6 %
NEUTROS ABS: 7544 {cells}/uL (ref 1500–7800)
NEUTROS PCT: 82 %
Platelets: 231 10*3/uL (ref 140–400)
RBC: 4.18 10*6/uL — ABNORMAL LOW (ref 4.20–5.80)
RDW: 13.2 % (ref 11.0–15.0)
Total Lymphocyte: 9.7 %
WBC: 9.2 10*3/uL (ref 3.8–10.8)
WBCMIX: 607 {cells}/uL (ref 200–950)

## 2017-07-28 LAB — LIPID PANEL
CHOLESTEROL: 157 mg/dL (ref <200)
HDL: 27 mg/dL — AB (ref >40)
LDL Cholesterol (Calc): 90 mg/dL (calc)
NON-HDL CHOLESTEROL (CALC): 130 mg/dL — AB (ref <130)
Total CHOL/HDL Ratio: 5.8 (calc) — ABNORMAL HIGH (ref <5.0)
Triglycerides: 301 mg/dL — ABNORMAL HIGH (ref <150)

## 2017-07-28 LAB — HEMOGLOBIN A1C
EAG (MMOL/L): 13.6 (calc)
Hgb A1c MFr Bld: 10.2 % of total Hgb — ABNORMAL HIGH (ref <5.7)
Mean Plasma Glucose: 246 (calc)

## 2017-07-28 LAB — PROTIME-INR
INR: 2.5 — AB
Prothrombin Time: 25.9 s — ABNORMAL HIGH (ref 9.0–11.5)

## 2017-07-28 LAB — MAGNESIUM: Magnesium: 2.5 mg/dL (ref 1.5–2.5)

## 2017-07-28 LAB — URIC ACID: Uric Acid, Serum: 7.5 mg/dL (ref 4.0–8.0)

## 2017-07-28 LAB — TSH: TSH: 1.43 mIU/L (ref 0.40–4.50)

## 2017-08-01 ENCOUNTER — Other Ambulatory Visit: Payer: Self-pay | Admitting: Internal Medicine

## 2017-08-01 DIAGNOSIS — Z0001 Encounter for general adult medical examination with abnormal findings: Secondary | ICD-10-CM

## 2017-08-01 DIAGNOSIS — N183 Chronic kidney disease, stage 3 (moderate): Principal | ICD-10-CM

## 2017-08-01 DIAGNOSIS — E1122 Type 2 diabetes mellitus with diabetic chronic kidney disease: Secondary | ICD-10-CM

## 2017-08-08 ENCOUNTER — Other Ambulatory Visit (HOSPITAL_COMMUNITY): Payer: Self-pay | Admitting: Internal Medicine

## 2017-08-13 ENCOUNTER — Other Ambulatory Visit: Payer: Self-pay

## 2017-08-13 NOTE — Patient Outreach (Signed)
[facility]) Care Management  08/13/2017  TRUE GARCIAMARTINEZ 1940/05/08 [id number]   Medication Adherence call to Mr. Daiva Nakayama Left a message for patient to call back patient is due on Glipizide 10 mg.  Mr. Mcknight is showing past due under [place].  [facility] Management Direct Dial [phone]  Fax [phone] [email or web address]

## 2017-08-18 ENCOUNTER — Other Ambulatory Visit: Payer: Self-pay | Admitting: Adult Health

## 2017-08-18 MED ORDER — VALSARTAN 80 MG PO TABS: ORAL_TABLET | ORAL | 1 refills | Status: DC

## 2017-08-23 DIAGNOSIS — E1122 Type 2 diabetes mellitus with diabetic chronic kidney disease: Secondary | ICD-10-CM | POA: Diagnosis not present

## 2017-08-23 DIAGNOSIS — D631 Anemia in chronic kidney disease: Secondary | ICD-10-CM | POA: Diagnosis not present

## 2017-08-23 DIAGNOSIS — I129 Hypertensive chronic kidney disease with stage 1 through stage 4 chronic kidney disease, or unspecified chronic kidney disease: Secondary | ICD-10-CM | POA: Diagnosis not present

## 2017-08-23 DIAGNOSIS — E785 Hyperlipidemia, unspecified: Secondary | ICD-10-CM | POA: Diagnosis not present

## 2017-08-23 DIAGNOSIS — N183 Chronic kidney disease, stage 3 (moderate): Secondary | ICD-10-CM | POA: Diagnosis not present

## 2017-08-26 ENCOUNTER — Encounter (INDEPENDENT_AMBULATORY_CARE_PROVIDER_SITE_OTHER): Payer: Medicare Other | Admitting: Ophthalmology

## 2017-08-26 ENCOUNTER — Other Ambulatory Visit: Payer: Self-pay | Admitting: Nephrology

## 2017-08-26 ENCOUNTER — Other Ambulatory Visit: Payer: Self-pay

## 2017-08-26 DIAGNOSIS — E113312 Type 2 diabetes mellitus with moderate nonproliferative diabetic retinopathy with macular edema, left eye: Secondary | ICD-10-CM

## 2017-08-26 DIAGNOSIS — H353132 Nonexudative age-related macular degeneration, bilateral, intermediate dry stage: Secondary | ICD-10-CM

## 2017-08-26 DIAGNOSIS — I10 Essential (primary) hypertension: Secondary | ICD-10-CM | POA: Diagnosis not present

## 2017-08-26 DIAGNOSIS — N183 Chronic kidney disease, stage 3 unspecified: Secondary | ICD-10-CM

## 2017-08-26 DIAGNOSIS — H34832 Tributary (branch) retinal vein occlusion, left eye, with macular edema: Secondary | ICD-10-CM | POA: Diagnosis not present

## 2017-08-26 DIAGNOSIS — H35033 Hypertensive retinopathy, bilateral: Secondary | ICD-10-CM | POA: Diagnosis not present

## 2017-08-26 DIAGNOSIS — E11311 Type 2 diabetes mellitus with unspecified diabetic retinopathy with macular edema: Secondary | ICD-10-CM

## 2017-08-26 DIAGNOSIS — D631 Anemia in chronic kidney disease: Secondary | ICD-10-CM

## 2017-08-26 DIAGNOSIS — H43813 Vitreous degeneration, bilateral: Secondary | ICD-10-CM

## 2017-08-26 DIAGNOSIS — I129 Hypertensive chronic kidney disease with stage 1 through stage 4 chronic kidney disease, or unspecified chronic kidney disease: Secondary | ICD-10-CM

## 2017-08-26 DIAGNOSIS — N189 Chronic kidney disease, unspecified: Secondary | ICD-10-CM

## 2017-08-26 DIAGNOSIS — E113291 Type 2 diabetes mellitus with mild nonproliferative diabetic retinopathy without macular edema, right eye: Secondary | ICD-10-CM

## 2017-08-26 MED ORDER — VALSARTAN 80 MG PO TABS: ORAL_TABLET | ORAL | 1 refills | Status: DC

## 2017-08-30 ENCOUNTER — Ambulatory Visit
Admission: RE | Admit: 2017-08-30 | Discharge: 2017-08-30 | Disposition: A | Discharge status: Home / Self Care | Payer: Medicare Other | Source: Ambulatory Visit | Attending: Nephrology | Admitting: Nephrology

## 2017-08-30 DIAGNOSIS — D631 Anemia in chronic kidney disease: Secondary | ICD-10-CM

## 2017-08-30 DIAGNOSIS — N183 Chronic kidney disease, stage 3 unspecified: Secondary | ICD-10-CM

## 2017-08-30 DIAGNOSIS — N189 Chronic kidney disease, unspecified: Secondary | ICD-10-CM

## 2017-08-30 DIAGNOSIS — I129 Hypertensive chronic kidney disease with stage 1 through stage 4 chronic kidney disease, or unspecified chronic kidney disease: Secondary | ICD-10-CM

## 2017-08-31 ENCOUNTER — Ambulatory Visit: Payer: Self-pay | Admitting: Adult Health

## 2017-09-07 DIAGNOSIS — E876 Hypokalemia: Secondary | ICD-10-CM | POA: Diagnosis not present

## 2017-09-09 ENCOUNTER — Encounter: Payer: Self-pay | Admitting: Internal Medicine

## 2017-09-10 NOTE — Progress Notes (Signed)
MEDICARE ANNUAL WELLNESS VISIT AND FOLLOW UP Assessment:   Medicare annual wellness visit  PAF (paroxysmal atrial fibrillation) ([place]) Continue follow up cardio, continue coumadin -     CBC with Differential/Platelet  Chronic diastolic congestive heart failure (HCC) Weight stable, continue medications, refill potassium -     potassium chloride SA (K-DUR,KLOR-CON) 20 MEQ tablet; Take 1 tablet (20 mEq total) by mouth 2 (two) times daily.  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 Call if greater than 130/80.  -     CMP/GFR  Coronary artery disease involving native coronary artery of native heart without angina pectoris Control blood pressure, cholesterol, glucose, increase exercise.   PFO (patent foramen ovale) Monitoring with cardio  Pulmonary hypertension (HCC) Monitored by cardio and pulmonary, doing much better  Atrial flutter, unspecified type ([place]) Continue follow up cardio, continue coumadin  Chronic respiratory failure with hypoxia (HCC) Monitored by pulmonary  OSA on CPAP Continue CPAP  Chronic restrictive lung disease Monitored by pulmonary  Diverticulosis of large intestine without hemorrhage  No tenderness, continue fiber  GERD Continue PPI/H2 blocker, diet discussed  Controlled type 2 diabetes mellitus with stage 3 chronic kidney disease, without long-term current use of insulin ([place]) Discussed general issues about diabetes pathophysiology and management., Educational material distributed., Suggested low cholesterol diet., Encouraged aerobic exercise., Discussed foot  care., Reminded to get yearly retinal exam - report from most recent verified Currently taking 40 units AM, 35 units PM; evening glucose running high; advised taper up AM insulin by 3 units every 3 days ~10 more units and follow up with insulin log  Osteoarthritis, unspecified osteoarthritis type, unspecified site RICE, NSAIDS, exercises given, if not better get xray and PT referral or ortho referral.   CKD (chronic kidney disease) stage 3, GFR 30-59 ml/min  avoid NSAIDS, monitor sugars, will monitor -     BASIC METABOLIC PANEL WITH GFR  Hyperlipidemia -continue medications, check lipids, decrease fatty foods, increase activity.   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   Gout, unspecified cause, unspecified chronicity, unspecified site Monitor  Iron deficiency/blood loss anemia - check CBC   Over 30 minutes of exam, counseling, chart review, and critical decision making was performed  Future Appointments  Date Time Provider [place]  11/01/2017  3:30 PM [name], MD [facility] None  11/04/2017  8:15 AM Hayden Pedro, MD [facility] None  05/18/2018  2:00 PM [name], MD [facility] None     Plan:   During the course of the visit the patient was educated and counseled about appropriate screening and preventive services including:    Pneumococcal vaccine   Influenza  vaccine  Prevnar 13  Td vaccine  Screening electrocardiogram  Colorectal cancer screening  Diabetes screening  Glaucoma screening  Nutrition counseling    Subjective:  [name]. is a 77 y.o. male who presents for Medicare Annual Wellness Visit and 1 month follow up for HTN, CKD, weight and pfib coumadin management.   He has a complicated heart history, had ASCAD and pfib/flutter in Apr 2015 with subsequent CABG with MAZE. 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. Weight is stable.    He is on xanax as needed for sleep and zoloft for depression/anxiety which is in remission.   BMI is Body mass index is 27.2 kg/m., he has been working on diet and exercise. Wt Readings from Last 3 Encounters:  09/13/17 195 lb (88.5 kg)  07/27/17 191 lb (86.6 kg)  06/23/17 193 lb (87.5 kg)   His blood pressure has been controlled at home, today their BP is BP: 126/70 He does workout, walks and works out in the yard. He denies chest pain, shortness of breath, dizziness.   He is still on coumadin at this time, denies any bleeding, black stools, etc. He is on 10 mg daily except Sunday taking 7.5 mg Lab Results  Component Value Date   INR 2.5 (H) 07/27/2017   INR 3.0 (H) 06/23/2017   INR 2.5 (H) 05/24/2017   Last GFR Lab Results  Component Value Date   GFRNONAA 24 (L) 07/27/2017  Just had BMP checked last week at nephrology, reports unchanged from previous    Medication Review: Current Outpatient Medications on File Prior to Visit  Medication Sig  . ACCU-CHEK AVIVA PLUS test strip CHECK BLOOD GLUCOSE 3 TIMES DAILY.  [name] acetaminophen (TYLENOL) 500 MG tablet Take 1,000 mg by mouth every 6 (six) hours as needed for moderate pain or headache.   . allopurinol (ZYLOPRIM) 300 MG tablet Take 1 tablet daily to prevent gout (Patient taking differently: Take 300 mg by mouth  daily. )  . ALPRAZolam (XANAX) 1 MG tablet Take 0.5 tablets (0.5 mg total) by mouth at bedtime as needed for sleep.  [name] Alum Hydroxide-Mag Carbonate (GAVISCON PO) Take 4 tablets by mouth daily as needed (acid reflux).   [name] aspirin EC 81 MG tablet Take 81 mg by mouth daily.  [name] atorvastatin (LIPITOR) 40 MG tablet Take 20 mg by mouth every Monday, Wednesday, and Friday.   [name] azelastine (ASTELIN) 0.1 % nasal spray PLACE 2 SPRAYS INTO BOTH NOSTRILS 2 (TWO) TIMES DAILY. USE IN EACH NOSTRIL AS DIRECTED  . B Complex Vitamins (VITAMIN B COMPLEX PO) Take 1 tablet by mouth at bedtime.   . bacitracin 500 UNIT/GM ointment Apply 1 application topically daily as needed for wound care.  . Blood Glucose Monitoring Suppl (ACCU-CHEK AVIVA PLUS) w/Device KIT Check blood sugar 1 time  daily  . Blood Glucose Monitoring Suppl DEVI Test blood sugar up to three times a day or as directed.  . carvedilol (COREG) 6.25 MG tablet TAKE 1 TABLET BY MOUTH TWO  TIMES DAILY  . Cholecalciferol (VITAMIN D PO) Take 5,000 Units by mouth 2 (two) times daily.  . colchicine 0.6 MG tablet Take 0.6 mg by mouth daily as needed (for gout flare up).   . diphenhydrAMINE (BENADRYL) 25 MG tablet Take 25 mg by mouth at bedtime as needed for allergies.   . ferrous sulfate 325 (65 FE) MG tablet Take 325 mg by mouth 2 (two) times  daily with a meal.  . fluticasone (FLONASE) 50 MCG/ACT nasal spray Place 1 spray into both nostrils daily as needed for allergies.   [name] HYDROcodone-acetaminophen (NORCO/VICODIN) 5-325 MG tablet Take 1-2 tablets by mouth every 6 (six) hours as needed for moderate pain.   [name] insulin NPH-regular Human (NOVOLIN 70/30) (70-30) 100 UNIT/ML injection Inject into the skin.  [name] insulin NPH-regular Human (NOVOLIN 70/30) (70-30) 100 UNIT/ML injection Inject 25 Units into the skin daily. 15 units AM, 10 units PM  . lidocaine (LIDODERM) 5 % Place 1 patch onto the skin daily as needed (for pain). Remove & Discard patch within 12 hours or as  directed by MD   . loratadine (CLARITIN) 10 MG tablet Take 10 mg by mouth daily as needed for allergies.   . metolazone (ZAROXOLYN) 5 MG tablet Take 5 mg by mouth daily as needed (weight gain and edema).  . Multiple Vitamin (MULTIVITAMIN WITH MINERALS) TABS tablet Take 1 tablet by mouth at bedtime.   . Multiple Vitamins-Minerals (PRESERVISION AREDS 2) CAPS Take 1 capsule by mouth 2 (two) times daily.   . polyvinyl alcohol (ARTIFICIAL TEARS) 1.4 % ophthalmic solution Place 1 drop into both eyes daily as needed for dry eyes.  . potassium chloride SA (K-DUR,KLOR-CON) 20 MEQ tablet Take 20 mEq by mouth 3 (three) times daily.  [name] PRESCRIPTION MEDICATION Inhale into the lungs at bedtime. CPAP  . ranitidine (ZANTAC) 150 MG tablet Take 150 mg by mouth daily as needed for heartburn.   . sertraline (ZOLOFT) 100 MG tablet TAKE 1 TABLET BY MOUTH  DAILY  . torsemide (DEMADEX) 100 MG tablet Take 100 mg in AM and 50 mg in PM if needed for weight gain (Patient taking differently: Take 50-100 mg by mouth See admin instructions. TAKE 1 TABLET (100 MG) BY MOUTH SCHEDULED IN THE MORNING, AND MAY TAKE 0.5 TABLET (50 MG) IN THE EVENING IF NEEDED FOR SWELLING/WEIGHT GAIN)  . warfarin (COUMADIN) 5 MG tablet Take 7.5-10 mg by mouth See admin instructions. Take 7.5 mg by mouth daily on Sunday. Take 10 mg by mouth daily on all other days  . valsartan (DIOVAN) 80 MG tablet Take 1/2- 1 tab daily as directed for blood pressure. (Patient not taking: Reported on 09/13/2017)   No current facility-administered medications on file prior to visit.     Allergies: Allergies  Allergen Reactions  . Sunflower Seed [Sunflower Oil] Swelling and Other (See Comments)    Tongue and lip swelling  . Horse-Derived Products Other (See Comments)    Per allergy skin test UNSPECIFIED REACTION   . Tetanus Toxoids Other (See Comments)    Per allergy skin test  . 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; T2_NIDDM w/CKD3 (GFR 50 ml/min); GERD; DJD (degenerative joint disease); Pleural effusion on left; Vitamin D deficiency; Medication management; Chronic anticoagulation; HTN (hypertension); CAD (coronary artery disease); PFO (patent foramen ovale); 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[place]); OSA on CPAP; S/P Maze operation for atrial fibrillation; Pulmonary hypertension ([place]); 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  Screening Tests Immunization History  Administered Date(s) Administered  . DT 08/05/2015  . Influenza Split 10/25/2012  . Influenza, High Dose Seasonal PF 09/26/2013, 09/27/2014, 10/03/2015, 10/05/2016  . Pneumococcal Conjugate-13 01/30/2014  . Pneumococcal Polysaccharide-23 10/20/2011  . Td 01/06/2000   Preventative care: Last colonoscopy: 01/26/2017 3 year recall?  EGD: 01/26/2017  Prior vaccinations: TD or Tdap: 2002 Influenza: 2018 Pneumococcal: 2013 Prevnar13: 2016 Shingles/Zostavax: undecided  Names of Other Physician/Practitioners you currently use: 1. [facility] here for primary care 2. Dr. Zigmund Daniel, eye doctor, last visit 02/04/2017 - report received and abstracted 3. Dr. Johnn Hai, dentist, last visit 2019, goes q45m Patient Care Team: MUnk Pinto MD as PCP - General (Internal Medicine) KInda Castle MD (Inactive) as Consulting Physician (Gastroenterology) JMartinique Peter M, MD as Consulting Physician (Cardiology) ORexene Alberts MD as Consulting  Physician (Cardiothoracic Surgery) Szott, MPrescott Gum DDS as Referring Physician MHayden Pedro MD as Consulting Physician (Ophthalmology) TLavonna Monarch MD as Consulting Physician (Dermatology) NJosue Hector MD as Consulting Physician (Cardiology) AThompson Grayer MD as Consulting Physician (Cardiology) [place], FEppie Gibson RN as TClarkManagement  Surgical: He  has a past surgical history that includes Polinydal cyst; Great toe arthrodesis, interphalangeal joint; Retina repair-right; Cataract extraction; Excision basal cell carcinoma; Polypectomy; Cardiac catheterization; Coronary artery bypass graft (N/A, 04/05/2013); MAZE (N/A, 04/05/2013); Intraoprative transesophageal echocardiogram (N/A, 04/05/2013); TEE without cardioversion (N/A, 08/23/2013); Cardioversion (N/A, 08/23/2013); TEE without cardioversion (N/A, 10/26/2013); left heart catheterization with coronary angiogram (N/A, 03/07/2013); atrial fibrillation ablation (N/A, 10/26/2013); Carpometacarpel ([facility] Iv suspension plasty (Left, 02/14/2014); Trapezium resection; right heart catheterization (N/A, 05/03/2014); TEE without cardioversion (N/A, 06/05/2014); Esophagogastroduodenoscopy (egd) with propofol (N/A, 01/26/2017); Colonoscopy with propofol (N/A, 01/26/2017); Appendectomy (03/05/2017); and laparoscopic appendectomy (N/A, 03/05/2017). Family His family history includes Atrial fibrillation in his mother; Colon cancer in his mother; Colon polyps in his mother and sister; Dementia in his father; Diabetes in his maternal uncle; Hypertension in his mother; Stroke in his paternal uncle. Social history  He reports that he quit smoking about 36 years ago. His smoking use included cigarettes. He has a 100.00 pack-year smoking history. He has never used smokeless tobacco. He reports that he drinks about 1.0 standard drinks of alcohol per week. He reports that he does not use drugs.  MEDICARE WELLNESS OBJECTIVES: Physical activity:    Cardiac risk factors:   Depression/mood screen:   [facility] 09/13/2017  Decreased Interest 0  Down, Depressed, Hopeless 0  PHQ - 2 Score 0  Some recent data might be hidden    ADLs:  In your present state of health, do you have any difficulty performing the following activities: 04/24/2017 03/05/2017  Hearing? N -  Vision? N -  Difficulty concentrating or making decisions? N -  Walking or climbing stairs? N -  Dressing or bathing? N -  Doing errands, shopping? N N  Some recent data might be hidden     Cognitive Testing  Alert? Yes  Normal Appearance?Yes  Oriented to person? Yes  Place? Yes   Time? Yes  Recall of three objects?  Yes  Can perform simple calculations? Yes  Displays appropriate judgment?Yes  Can read the correct time from a watch face?Yes  EOL planning: Does Patient Have a Medical Advance Directive?: Yes Type of Advance Directive: Healthcare Power of Attorney, Living will Does patient want to make changes to medical advance directive?: No - Patient declined Copy of HBrulein Chart?: Yes  Review of Systems  Constitutional:  Negative for chills, fever, malaise/fatigue and weight loss.  HENT: Negative.  Negative for hearing loss and tinnitus.   Eyes: Negative for blurred vision and double vision.  Respiratory: Negative for cough, sputum production, shortness of breath and wheezing.   Cardiovascular: Negative for chest pain, palpitations, orthopnea, claudication, leg swelling (improved) and PND.  Gastrointestinal: Negative.  Negative for abdominal pain, blood in stool, constipation, diarrhea, heartburn, melena, nausea and vomiting.  Genitourinary: Negative.  Negative for dysuria, flank pain, frequency, hematuria and urgency.  Musculoskeletal: Negative.  Negative for falls, joint pain and myalgias.  Skin: Negative.  Negative for rash.  Neurological: Negative for dizziness, tingling, tremors, sensory change, speech change, focal  weakness, seizures, loss of consciousness, weakness and headaches.  Endo/Heme/Allergies: Negative for polydipsia.  Psychiatric/Behavioral: Negative.  Negative for depression, memory loss, substance abuse and suicidal ideas. The patient is not nervous/anxious and does not have insomnia.   All other systems reviewed and are negative.    Objective:   Today's Vitals   09/13/17 1507  BP: 126/70  Pulse: 73  Temp: (!) 97.5 F (36.4 C)  SpO2: 95%  Weight: 195 lb (88.5 kg)  Height: 5' 11"  (1.803 m)   Body mass index is 27.2 kg/m.  General appearance: alert, no distress, WD/WN, male HEENT: normocephalic, sclerae anicteric, TMs pearly, nares patent, no discharge or erythema, pharynx normal Oral cavity: MMM, no lesions Neck: supple, no lymphadenopathy, no thyromegaly, no masses Heart: RRR, normal S1, S2, no murmurs Lungs: CTA bilaterally, no wheezes, rhonchi, or rales Abdomen: +bs, soft, non tender, non distended, no masses, no hepatomegaly, no splenomegaly Musculoskeletal: nontender, no swelling, no obvious deformity Extremities: no edema, no cyanosis, no clubbing Pulses: 2+ symmetric, upper and lower extremities, normal cap refill Neurological: alert, oriented x 3, CN2-12 intact, strength normal upper extremities and lower extremities, sensation normal throughout, DTRs 2+ throughout, no cerebellar signs, gait normal Psychiatric: normal affect, behavior normal, pleasant   Medicare Attestation I have personally reviewed: The patient's medical and social history Their use of alcohol, tobacco or illicit drugs Their current medications and supplements The patient's functional ability including ADLs,fall risks, home safety risks, cognitive, and hearing and visual impairment Diet and physical activities Evidence for depression or mood disorders  The patient's weight, height, BMI, and visual acuity have been recorded in the chart.  I have made referrals, counseling, and provided education  to the patient based on review of the above and I have provided the patient with a written personalized care plan for preventive services.     Izora Ribas, NP   09/13/2017

## 2017-09-10 NOTE — Patient Instructions (Addendum)
  [name] , Thank you for taking time to come for your Medicare Wellness Visit. I appreciate your ongoing commitment to your health goals. Please review the following plan we discussed and let me know if I can assist you in the future.   These are the goals we discussed: Goals    . Blood Pressure < 140/90    . HEMOGLOBIN A1C < 8.0    . LDL CALC < 70       This is a list of the screening recommended for you and due dates:  Health Maintenance  Topic Date Due  . Flu Shot  08/05/2017  . Hemoglobin A1C  01/27/2018  . Eye exam for diabetics  02/04/2018  . Complete foot exam   04/22/2018  . Tetanus Vaccine  07/05/2025  . Pneumonia vaccines  Completed

## 2017-09-13 ENCOUNTER — Telehealth: Payer: Self-pay

## 2017-09-13 ENCOUNTER — Ambulatory Visit (INDEPENDENT_AMBULATORY_CARE_PROVIDER_SITE_OTHER): Payer: Medicare Other | Admitting: Adult Health

## 2017-09-13 ENCOUNTER — Encounter: Payer: Self-pay | Admitting: Adult Health

## 2017-09-13 VITALS — BP 126/70 | HR 73 | Temp 97.5°F | Ht 71.0 in | Wt 195.0 lb

## 2017-09-13 DIAGNOSIS — E782 Mixed hyperlipidemia: Secondary | ICD-10-CM

## 2017-09-13 DIAGNOSIS — I4892 Unspecified atrial flutter: Secondary | ICD-10-CM | POA: Diagnosis not present

## 2017-09-13 DIAGNOSIS — Z860100 Personal history of colon polyps, unspecified: Secondary | ICD-10-CM

## 2017-09-13 DIAGNOSIS — J984 Other disorders of lung: Secondary | ICD-10-CM

## 2017-09-13 DIAGNOSIS — Q264 Anomalous pulmonary venous connection, unspecified: Secondary | ICD-10-CM

## 2017-09-13 DIAGNOSIS — Z9989 Dependence on other enabling machines and devices: Secondary | ICD-10-CM

## 2017-09-13 DIAGNOSIS — E1122 Type 2 diabetes mellitus with diabetic chronic kidney disease: Secondary | ICD-10-CM

## 2017-09-13 DIAGNOSIS — Z0000 Encounter for general adult medical examination without abnormal findings: Secondary | ICD-10-CM

## 2017-09-13 DIAGNOSIS — Z79899 Other long term (current) drug therapy: Secondary | ICD-10-CM

## 2017-09-13 DIAGNOSIS — M109 Gout, unspecified: Secondary | ICD-10-CM

## 2017-09-13 DIAGNOSIS — K5730 Diverticulosis of large intestine without perforation or abscess without bleeding: Secondary | ICD-10-CM

## 2017-09-13 DIAGNOSIS — I272 Pulmonary hypertension, unspecified: Secondary | ICD-10-CM

## 2017-09-13 DIAGNOSIS — Q211 Atrial septal defect: Secondary | ICD-10-CM

## 2017-09-13 DIAGNOSIS — Z8679 Personal history of other diseases of the circulatory system: Secondary | ICD-10-CM

## 2017-09-13 DIAGNOSIS — M1990 Unspecified osteoarthritis, unspecified site: Secondary | ICD-10-CM

## 2017-09-13 DIAGNOSIS — N183 Chronic kidney disease, stage 3 unspecified: Secondary | ICD-10-CM

## 2017-09-13 DIAGNOSIS — Q2112 Patent foramen ovale: Secondary | ICD-10-CM

## 2017-09-13 DIAGNOSIS — I110 Hypertensive heart disease with heart failure: Secondary | ICD-10-CM

## 2017-09-13 DIAGNOSIS — I2510 Atherosclerotic heart disease of native coronary artery without angina pectoris: Secondary | ICD-10-CM

## 2017-09-13 DIAGNOSIS — D509 Iron deficiency anemia, unspecified: Secondary | ICD-10-CM

## 2017-09-13 DIAGNOSIS — Z7901 Long term (current) use of anticoagulants: Secondary | ICD-10-CM

## 2017-09-13 DIAGNOSIS — Z0001 Encounter for general adult medical examination with abnormal findings: Secondary | ICD-10-CM | POA: Diagnosis not present

## 2017-09-13 DIAGNOSIS — Q262 Total anomalous pulmonary venous connection: Secondary | ICD-10-CM

## 2017-09-13 DIAGNOSIS — Z6826 Body mass index (BMI) 26.0-26.9, adult: Secondary | ICD-10-CM

## 2017-09-13 DIAGNOSIS — I10 Essential (primary) hypertension: Secondary | ICD-10-CM

## 2017-09-13 DIAGNOSIS — F3342 Major depressive disorder, recurrent, in full remission: Secondary | ICD-10-CM

## 2017-09-13 DIAGNOSIS — I43 Cardiomyopathy in diseases classified elsewhere: Secondary | ICD-10-CM

## 2017-09-13 DIAGNOSIS — G4733 Obstructive sleep apnea (adult) (pediatric): Secondary | ICD-10-CM

## 2017-09-13 DIAGNOSIS — E559 Vitamin D deficiency, unspecified: Secondary | ICD-10-CM

## 2017-09-13 DIAGNOSIS — K2100 Gastro-esophageal reflux disease with esophagitis, without bleeding: Secondary | ICD-10-CM

## 2017-09-13 DIAGNOSIS — R6889 Other general symptoms and signs: Secondary | ICD-10-CM | POA: Diagnosis not present

## 2017-09-13 DIAGNOSIS — J90 Pleural effusion, not elsewhere classified: Secondary | ICD-10-CM

## 2017-09-13 DIAGNOSIS — Z9889 Other specified postprocedural states: Secondary | ICD-10-CM

## 2017-09-13 DIAGNOSIS — I5032 Chronic diastolic (congestive) heart failure: Secondary | ICD-10-CM

## 2017-09-13 DIAGNOSIS — I480 Paroxysmal atrial fibrillation: Secondary | ICD-10-CM

## 2017-09-13 DIAGNOSIS — D692 Other nonthrombocytopenic purpura: Secondary | ICD-10-CM

## 2017-09-13 DIAGNOSIS — Z951 Presence of aortocoronary bypass graft: Secondary | ICD-10-CM

## 2017-09-13 NOTE — Telephone Encounter (Signed)
Glucose-100 BUN-53 Creatinine-2.56 GFR-23 Chloride

## 2017-09-14 ENCOUNTER — Other Ambulatory Visit: Payer: Self-pay | Admitting: Adult Health

## 2017-09-14 DIAGNOSIS — D649 Anemia, unspecified: Secondary | ICD-10-CM

## 2017-09-14 LAB — CBC WITH DIFFERENTIAL/PLATELET
BASOS PCT: 0.4 %
Basophils Absolute: 32 cells/uL (ref 0–200)
EOS ABS: 128 {cells}/uL (ref 15–500)
Eosinophils Relative: 1.6 %
HEMATOCRIT: 35.4 % — AB (ref 38.5–50.0)
Hemoglobin: 11.9 g/dL — ABNORMAL LOW (ref 13.2–17.1)
LYMPHS ABS: 784 {cells}/uL — AB (ref 850–3900)
MCH: 32.4 pg (ref 27.0–33.0)
MCHC: 33.6 g/dL (ref 32.0–36.0)
MCV: 96.5 fL (ref 80.0–100.0)
MPV: 8.8 fL (ref 7.5–12.5)
Monocytes Relative: 6.9 %
NEUTROS PCT: 81.3 %
Neutro Abs: 6504 cells/uL (ref 1500–7800)
Platelets: 180 10*3/uL (ref 140–400)
RBC: 3.67 10*6/uL — AB (ref 4.20–5.80)
RDW: 14.1 % (ref 11.0–15.0)
Total Lymphocyte: 9.8 %
WBC: 8 10*3/uL (ref 3.8–10.8)
WBCMIX: 552 {cells}/uL (ref 200–950)

## 2017-09-14 LAB — PROTIME-INR
INR: 2.8 — ABNORMAL HIGH
Prothrombin Time: 29.2 s — ABNORMAL HIGH (ref 9.0–11.5)

## 2017-09-19 ENCOUNTER — Other Ambulatory Visit: Payer: Self-pay | Admitting: Internal Medicine

## 2017-09-30 ENCOUNTER — Other Ambulatory Visit (INDEPENDENT_AMBULATORY_CARE_PROVIDER_SITE_OTHER): Payer: Medicare Other

## 2017-09-30 DIAGNOSIS — D649 Anemia, unspecified: Secondary | ICD-10-CM

## 2017-09-30 LAB — POC HEMOCCULT BLD/STL (HOME/3-CARD/SCREEN)
Card #2 Fecal Occult Blod, POC: NEGATIVE
Card #3 Fecal Occult Blood, POC: NEGATIVE
Fecal Occult Blood, POC: NEGATIVE

## 2017-10-05 LAB — CBC WITH DIFFERENTIAL/PLATELET
BASOS PCT: 0.5 %
Basophils Absolute: 41 cells/uL (ref 0–200)
EOS PCT: 1.5 %
Eosinophils Absolute: 122 cells/uL (ref 15–500)
HCT: 37 % — ABNORMAL LOW (ref 38.5–50.0)
Hemoglobin: 12.4 g/dL — ABNORMAL LOW (ref 13.2–17.1)
Lymphs Abs: 867 cells/uL (ref 850–3900)
MCH: 32.2 pg (ref 27.0–33.0)
MCHC: 33.5 g/dL (ref 32.0–36.0)
MCV: 96.1 fL (ref 80.0–100.0)
MPV: 9.3 fL (ref 7.5–12.5)
Monocytes Relative: 7.1 %
Neutro Abs: 6496 cells/uL (ref 1500–7800)
Neutrophils Relative %: 80.2 %
PLATELETS: 188 10*3/uL (ref 140–400)
RBC: 3.85 10*6/uL — AB (ref 4.20–5.80)
RDW: 13.3 % (ref 11.0–15.0)
TOTAL LYMPHOCYTE: 10.7 %
WBC: 8.1 10*3/uL (ref 3.8–10.8)
WBCMIX: 575 {cells}/uL (ref 200–950)

## 2017-10-05 LAB — COMPLETE METABOLIC PANEL WITH GFR
AG RATIO: 1.5 (calc) (ref 1.0–2.5)
ALT: 22 U/L (ref 9–46)
AST: 23 U/L (ref 10–35)
Albumin: 4.3 g/dL (ref 3.6–5.1)
Alkaline phosphatase (APISO): 117 U/L — ABNORMAL HIGH (ref 40–115)
BUN/Creatinine Ratio: 22 (calc) (ref 6–22)
BUN: 45 mg/dL — AB (ref 7–25)
CALCIUM: 9.2 mg/dL (ref 8.6–10.3)
CO2: 32 mmol/L (ref 20–32)
CREATININE: 2.02 mg/dL — AB (ref 0.70–1.18)
Chloride: 98 mmol/L (ref 98–110)
GFR, EST NON AFRICAN AMERICAN: 31 mL/min/{1.73_m2} — AB (ref >=60)
GFR, Est African American: 36 mL/min/{1.73_m2} — ABNORMAL LOW (ref >=60)
GLUCOSE: 375 mg/dL — AB (ref 65–99)
Globulin: 2.8 g/dL (calc) (ref 1.9–3.7)
POTASSIUM: 3.8 mmol/L (ref 3.5–5.3)
Sodium: 142 mmol/L (ref 135–146)
Total Bilirubin: 0.6 mg/dL (ref 0.2–1.2)
Total Protein: 7.1 g/dL (ref 6.1–8.1)

## 2017-10-05 LAB — PROTIME-INR
INR: 3 — ABNORMAL HIGH
PROTHROMBIN TIME: 31 s — AB (ref 9.0–11.5)

## 2017-10-14 ENCOUNTER — Other Ambulatory Visit: Payer: Self-pay

## 2017-10-14 DIAGNOSIS — L570 Actinic keratosis: Secondary | ICD-10-CM | POA: Diagnosis not present

## 2017-10-14 DIAGNOSIS — M109 Gout, unspecified: Secondary | ICD-10-CM | POA: Diagnosis not present

## 2017-10-14 DIAGNOSIS — D229 Melanocytic nevi, unspecified: Secondary | ICD-10-CM | POA: Diagnosis not present

## 2017-10-14 DIAGNOSIS — D485 Neoplasm of uncertain behavior of skin: Secondary | ICD-10-CM | POA: Diagnosis not present

## 2017-10-14 DIAGNOSIS — L821 Other seborrheic keratosis: Secondary | ICD-10-CM | POA: Diagnosis not present

## 2017-10-17 ENCOUNTER — Other Ambulatory Visit (HOSPITAL_COMMUNITY): Payer: Self-pay | Admitting: Internal Medicine

## 2017-10-26 DIAGNOSIS — N2581 Secondary hyperparathyroidism of renal origin: Secondary | ICD-10-CM | POA: Diagnosis not present

## 2017-10-26 DIAGNOSIS — E876 Hypokalemia: Secondary | ICD-10-CM | POA: Diagnosis not present

## 2017-10-26 DIAGNOSIS — N184 Chronic kidney disease, stage 4 (severe): Secondary | ICD-10-CM | POA: Diagnosis not present

## 2017-10-26 DIAGNOSIS — I129 Hypertensive chronic kidney disease with stage 1 through stage 4 chronic kidney disease, or unspecified chronic kidney disease: Secondary | ICD-10-CM | POA: Diagnosis not present

## 2017-10-26 DIAGNOSIS — D631 Anemia in chronic kidney disease: Secondary | ICD-10-CM | POA: Diagnosis not present

## 2017-10-31 ENCOUNTER — Other Ambulatory Visit (HOSPITAL_COMMUNITY): Payer: Self-pay | Admitting: Internal Medicine

## 2017-11-01 ENCOUNTER — Ambulatory Visit: Payer: Self-pay | Admitting: Internal Medicine

## 2017-11-04 ENCOUNTER — Encounter (INDEPENDENT_AMBULATORY_CARE_PROVIDER_SITE_OTHER): Payer: Medicare Other | Admitting: Ophthalmology

## 2017-11-04 DIAGNOSIS — H34832 Tributary (branch) retinal vein occlusion, left eye, with macular edema: Secondary | ICD-10-CM

## 2017-11-04 DIAGNOSIS — I10 Essential (primary) hypertension: Secondary | ICD-10-CM

## 2017-11-04 DIAGNOSIS — H353132 Nonexudative age-related macular degeneration, bilateral, intermediate dry stage: Secondary | ICD-10-CM

## 2017-11-04 DIAGNOSIS — H43813 Vitreous degeneration, bilateral: Secondary | ICD-10-CM

## 2017-11-04 DIAGNOSIS — H35033 Hypertensive retinopathy, bilateral: Secondary | ICD-10-CM

## 2017-11-08 ENCOUNTER — Encounter: Payer: Self-pay | Admitting: Internal Medicine

## 2017-11-08 ENCOUNTER — Other Ambulatory Visit: Payer: Self-pay | Admitting: *Deleted

## 2017-11-08 ENCOUNTER — Ambulatory Visit (INDEPENDENT_AMBULATORY_CARE_PROVIDER_SITE_OTHER): Payer: Medicare Other | Admitting: Internal Medicine

## 2017-11-08 VITALS — BP 104/60 | HR 92 | Temp 97.7°F | Resp 16 | Ht 71.0 in | Wt 193.4 lb

## 2017-11-08 DIAGNOSIS — Z5181 Encounter for therapeutic drug level monitoring: Secondary | ICD-10-CM

## 2017-11-08 DIAGNOSIS — I480 Paroxysmal atrial fibrillation: Secondary | ICD-10-CM

## 2017-11-08 DIAGNOSIS — I10 Essential (primary) hypertension: Secondary | ICD-10-CM

## 2017-11-08 DIAGNOSIS — M1A9XX0 Chronic gout, unspecified, without tophus (tophi): Secondary | ICD-10-CM

## 2017-11-08 DIAGNOSIS — Z794 Long term (current) use of insulin: Secondary | ICD-10-CM

## 2017-11-08 DIAGNOSIS — E1165 Type 2 diabetes mellitus with hyperglycemia: Secondary | ICD-10-CM | POA: Diagnosis not present

## 2017-11-08 DIAGNOSIS — E782 Mixed hyperlipidemia: Secondary | ICD-10-CM

## 2017-11-08 DIAGNOSIS — N184 Chronic kidney disease, stage 4 (severe): Secondary | ICD-10-CM

## 2017-11-08 DIAGNOSIS — E559 Vitamin D deficiency, unspecified: Secondary | ICD-10-CM | POA: Diagnosis not present

## 2017-11-08 DIAGNOSIS — Z7901 Long term (current) use of anticoagulants: Secondary | ICD-10-CM

## 2017-11-08 DIAGNOSIS — E1129 Type 2 diabetes mellitus with other diabetic kidney complication: Secondary | ICD-10-CM

## 2017-11-08 DIAGNOSIS — IMO0002 Reserved for concepts with insufficient information to code with codable children: Secondary | ICD-10-CM

## 2017-11-08 DIAGNOSIS — Z79899 Other long term (current) drug therapy: Secondary | ICD-10-CM

## 2017-11-08 DIAGNOSIS — E1122 Type 2 diabetes mellitus with diabetic chronic kidney disease: Secondary | ICD-10-CM

## 2017-11-08 MED ORDER — CARVEDILOL 6.25 MG PO TABS: 6.2500 mg | ORAL_TABLET | Freq: Three times a day (TID) | ORAL | 1 refills | Status: DC

## 2017-11-08 MED ORDER — FLUTICASONE PROPIONATE 50 MCG/ACT NA SUSP: 1.0000 | Freq: Every day | NASAL | 3 refills | Status: DC | PRN

## 2017-11-08 NOTE — Patient Instructions (Signed)
Bleeding Precautions When on Anticoagulant Therapy  WHAT IS ANTICOAGULANT THERAPY? Anticoagulant therapy is taking medicine to prevent or reduce blood clots. It is also called blood thinner therapy. Blood clots that form in your blood vessels can be dangerous. They can break loose and travel to your heart, lungs, or brain. This increases your risk of a heart attack or stroke. Anticoagulant therapy causes blood to clot more slowly. You may need anticoagulant therapy if you have:  A medical condition that increases the likelihood that blood clots will form.  A heart defect or a problem with heart rhythm. It is also a common treatment after heart surgery, such as valve replacement. WHAT ARE COMMON TYPES OF ANTICOAGULANT THERAPY? Anticoagulant medicine can be injected or taken by mouth.If you need anticoagulant therapy quickly at the hospital, the medicine may be injected under your skin or given through an IV tube. Heparin is a common example of an anticoagulant that you may get at the hospital. Most anticoagulant therapy is in the form of pills that you take at home every day. These may include:  Aspirin. This common blood thinner works by preventing blood cells (platelets) from sticking together to form a clot. Aspirin is not as strong as anticoagulants that slow down the time that it takes for your body to form a clot.  Clopidogrel. This is a newer type of drug that affects platelets. It is stronger than aspirin.  Warfarin. This is the most common anticoagulant. It changes the way your body uses vitamin K, a vitamin that helps your blood to clot. The risk of bleeding is higher with warfarin than with aspirin. You will need frequent blood tests to make sure you are taking the safest amount.  New anticoagulants. Several new drugs have been approved. They are all taken by mouth. Studies show that these drugs work as well as warfarin. They do not require blood testing. They may cause less bleeding  risk than warfarin. WHAT DO I NEED TO REMEMBER WHEN TAKING ANTICOAGULANT THERAPY? Anticoagulant therapy decreases your risk of forming a blood clot, but it increases your risk of bleeding. Work closely with your health care provider to make sure you are taking your medicine safely. These tips can help:  Learn ways to reduce your risk of bleeding.  If you are taking warfarin: ? Have blood tests as ordered by your health care provider. ? Do not make any sudden changes to your diet. Vitamin K in your diet can make warfarin less effective. ? Do not get pregnant. This medicine may cause birth defects.  Take your medicine at the same time every day. If you forget to take your medicine, take it as soon as you remember. If you miss a whole day, do not double your dose of medicine. Take your normal dose and call your health care provider to check in.  Do not stop taking your medicine on your own.  Tell your health care provider before you start taking any new medicine, vitamin, or herbal product. Some of these could interfere with your therapy.  Tell all of your health care providers that you are on anticoagulant therapy.  Do not have surgery, medical procedures, or dental work until you tell your health care provider that you are on anticoagulant therapy. WHAT CAN AFFECT HOW ANTICOAGULANTS WORK? Certain foods, vitamins, medicines, supplements, and herbal medicines change the way that anticoagulant therapy works. They may increase or decrease the effects of your anticoagulant therapy. Either result can be dangerous for you.  Many over-the-counter medicines for pain, colds, or stomach problems interfere with anticoagulant therapy. Take these only as told by your health care provider.  Do not drink alcohol. It can interfere with your medicine and increase your risk of an injury that causes bleeding.  If you are taking warfarin, do not begin eating more foods that contain vitamin K. These include  leafy green vegetables. Ask your health care provider if you should avoid any foods. WHAT ARE SOME WAYS TO PREVENT BLEEDING? You can prevent bleeding by taking certain precautions:  Be extra careful when you use knives, scissors, or other sharp objects.  Use an electric razor instead of a blade.  Do not use toothpicks.  Use a soft toothbrush.  Wear shoes that have nonskid soles.  Use bath mats and handrails in your bathroom.  Wear gloves while you do yard work.  Wear a helmet when you ride a bike.  Wear your seat belt.  Prevent falls by removing loose rugs and extension cords from areas where you walk.  Do not play contact sports or participate in other activities that have a high risk of injury. [place] PROVIDER? Call your health care provider if:  You miss a dose of medicine: ? And you are not sure what to do. ? For more than one day.  You have: ? Menstrual bleeding that is heavier than normal. ? Blood in your urine. ? A bloody nose or bleeding gums. ? Easy bruising. ? Blood in your stool (feces) or have black and tarry stool. ? Side effects from your medicine.  You feel weak or dizzy.  You become pregnant. Seek immediate medical care if:  You have bleeding that will not stop.  You have sudden and severe headache or belly pain.  You vomit or you cough up bright red blood.  You have a severe blow to your head. WHAT ARE SOME QUESTIONS TO ASK MY HEALTH CARE PROVIDER?  What is the best anticoagulant therapy for my condition?  What side effects should I watch for?  When should I take my medicine? What should I do if I forget to take it?  Will I need to have regular blood tests?  Do I need to change my diet? Are there foods or drinks that I should avoid?  What activities are safe for me?  What should I do if I want to get pregnant? This information is not intended to replace advice given to you by your health care provider.  Make sure you discuss any questions you have with your health care provider. Document Released: 12/03/2014 Document Reviewed: 12/03/2014 Elsevier Interactive Patient Education  2017 [place].  +++++++++++++++++++++++++++++ Recommend Adult Low Dose Aspirin or  coated  Aspirin 81 mg daily  To reduce risk of Colon Cancer 20 %,  Skin Cancer 26 % ,  Melanoma 46%  and  Pancreatic cancer 60% +++++++++++++++++++++++++ Vitamin D goal  is between 70-100.  Please make sure that you are taking your Vitamin D as directed.  It is very important as a natural anti-inflammatory  helping hair, skin, and nails, as well as reducing stroke and heart attack risk.  It helps your bones and helps with mood. It also decreases numerous cancer risks so please take it as directed.  Low Vit D is associated with a 200-300% higher risk for CANCER  and 200-300% higher risk for HEART   ATTACK  &  STROKE.   .....................................[name] It is also associated with  higher death rate at younger ages,  autoimmune diseases like Rheumatoid arthritis, Lupus, Multiple Sclerosis.    Also many other serious conditions, like depression, Alzheimer's Dementia, infertility, muscle aches, fatigue, fibromyalgia - just to name a few. ++++++++++++++++++++ Recommend the book "The END of DIETING" by Dr Excell Seltzer  & the book "The END of DIABETES " by Dr Excell Seltzer At [email or web address] - get book & Audio CD's    Being diabetic has a  300% increased risk for heart attack, stroke, cancer, and alzheimer- type vascular dementia. It is very important that you work harder with diet by avoiding all foods that are white. Avoid white rice (brown & wild rice is OK), white potatoes (sweetpotatoes in moderation is OK), White bread or wheat bread or anything made out of white flour like bagels, donuts, rolls, buns, biscuits, cakes, pastries, cookies, pizza crust, and pasta (made from white flour & egg whites) - vegetarian pasta or spinach or  wheat pasta is OK. Multigrain breads like Arnold's or Pepperidge [employer], or multigrain sandwich thins or flatbreads.  Diet, exercise and weight loss can reverse and cure diabetes in the early stages.  Diet, exercise and weight loss is very important in the control and prevention of complications of diabetes which affects every system in your body, ie. Brain - dementia/stroke, eyes - glaucoma/blindness, heart - heart attack/heart failure, kidneys - dialysis, stomach - gastric paralysis, intestines - malabsorption, nerves - severe painful neuritis, circulation - gangrene & loss of a leg(s), and finally cancer and Alzheimers.    I recommend avoid fried & greasy foods,  sweets/candy, white rice (brown or wild rice or Quinoa is OK), white potatoes (sweet potatoes are OK) - anything made from white flour - bagels, doughnuts, rolls, buns, biscuits,white and wheat breads, pizza crust and traditional pasta made of white flour & egg white(vegetarian pasta or spinach or wheat pasta is OK).  Multi-grain bread is OK - like multi-grain flat bread or sandwich thins. Avoid alcohol in excess. Exercise is also important.    Eat all the vegetables you want - avoid meat, especially red meat and dairy - especially cheese.  Cheese is the most concentrated form of trans-fats which is the worst thing to clog up our arteries. Veggie cheese is OK which can be found in the fresh produce section at [employer] or [employer] or [employer]  +++++++++++++++++++++ DASH Eating Plan  DASH stands for "Dietary Approaches to Stop Hypertension."   The DASH eating plan is a healthy eating plan that has been shown to reduce high blood pressure (hypertension). Additional health benefits may include reducing the risk of type 2 diabetes mellitus, heart disease, and stroke. The DASH eating plan may also help with weight loss. WHAT DO I NEED TO KNOW ABOUT THE DASH EATING PLAN? For the DASH eating plan, you will follow these general  guidelines:  Choose foods with a percent daily value for sodium of less than 5% (as listed on the food label).  Use salt-free seasonings or herbs instead of table salt or sea salt.  Check with your health care provider or pharmacist before using salt substitutes.  Eat lower-sodium products, often labeled as "lower sodium" or "no salt added."  Eat fresh foods.  Eat more vegetables, fruits, and low-fat dairy products.  Choose whole grains. Look for the word "whole" as the first word in the ingredient list.  Choose fish   Limit sweets, desserts, sugars, and sugary drinks.  Choose heart-healthy fats.  Eat veggie cheese  Eat more home-cooked food and less restaurant, buffet, and fast food.  Limit fried foods.  Cook foods using methods other than frying.  Limit canned vegetables. If you do use them, rinse them well to decrease the sodium.  When eating at a restaurant, ask that your food be prepared with less salt, or no salt if possible.                      WHAT FOODS CAN I EAT? Read Dr Fara Olden Fuhrman's books on The End of Dieting & The End of Diabetes  Grains Whole grain or whole wheat bread. Brown rice. Whole grain or whole wheat pasta. Quinoa, bulgur, and whole grain cereals. Low-sodium cereals. Corn or whole wheat flour tortillas. Whole grain cornbread. Whole grain crackers. Low-sodium crackers.  Vegetables Fresh or frozen vegetables (raw, steamed, roasted, or grilled). Low-sodium or reduced-sodium tomato and vegetable juices. Low-sodium or reduced-sodium tomato sauce and paste. Low-sodium or reduced-sodium canned vegetables.   Fruits All fresh, canned (in natural juice), or frozen fruits.  Protein Products  All fish and seafood.  Dried beans, peas, or lentils. Unsalted nuts and seeds. Unsalted canned beans.  Dairy Low-fat dairy products, such as skim or 1% milk, 2% or reduced-fat cheeses, low-fat ricotta or cottage cheese, or plain low-fat yogurt. Low-sodium or  reduced-sodium cheeses.  Fats and Oils Tub margarines without trans fats. Light or reduced-fat mayonnaise and salad dressings (reduced sodium). Avocado. Safflower, olive, or canola oils. Natural peanut or almond butter.  Other Unsalted popcorn and pretzels. The items listed above may not be a complete list of recommended foods or beverages. Contact your dietitian for more options.  +++++++++++++++  WHAT FOODS ARE NOT RECOMMENDED? Grains/ White flour or wheat flour White bread. White pasta. White rice. Refined cornbread. Bagels and croissants. Crackers that contain trans fat.  Vegetables  Creamed or fried vegetables. Vegetables in a . Regular canned vegetables. Regular canned tomato sauce and paste. Regular tomato and vegetable juices.  Fruits Dried fruits. Canned fruit in light or heavy syrup. Fruit juice.  Meat and Other Protein Products Meat in general - RED meat & White meat.  Fatty cuts of meat. Ribs, chicken wings, all processed meats as bacon, sausage, bologna, salami, fatback, hot dogs, bratwurst and packaged luncheon meats.  Dairy Whole or 2% milk, cream, half-and-half, and cream cheese. Whole-fat or sweetened yogurt. Full-fat cheeses or blue cheese. Non-dairy creamers and whipped toppings. Processed cheese, cheese spreads, or cheese curds.  Condiments Onion and garlic salt, seasoned salt, table salt, and sea salt. Canned and packaged gravies. Worcestershire sauce. Tartar sauce. Barbecue sauce. Teriyaki sauce. Soy sauce, including reduced sodium. Steak sauce. Fish sauce. Oyster sauce. Cocktail sauce. Horseradish. Ketchup and mustard. Meat flavorings and tenderizers. Bouillon cubes. Hot sauce. Tabasco sauce. Marinades. Taco seasonings. Relishes.  Fats and Oils Butter, stick margarine, lard, shortening and bacon fat. Coconut, palm kernel, or palm oils. Regular salad dressings.  Pickles and olives. Salted popcorn and pretzels.  The items listed above may not be a complete  list of foods and beverages to avoid.

## 2017-11-08 NOTE — Progress Notes (Signed)
This very nice 77 y.o. 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 Patient's Gout is quiescent on Allopuroinol.      Patient is treated for HTN (1980) & BP has been controlled at home. Today's BP is at goal -  104/60.  In 2015, he underwent CABG / MAZE procedure and also had RF Ablation for Afib/flutter & is on Coumadin. 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  Patient has had no complaints of any cardiac type chest pain, palpitations, dyspnea / orthopnea / PND, dizziness, claudication, or dependent edema. Patient also has OSA on CPAP with improved sleep hygiene & restorative sleep.      Hyperlipidemia is controlled with diet & meds. Patient denies myalgias or other med SE's. 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  He denies symptoms of reactive hypoglycemia, diabetic polys, paresthesias or visual blurring.   He reports am CBG's range betw 112-185 mg% and pm CBG's range betw 230-300 mg%. Current A1c is not at goal and he's advised to increase am Nov 70 /30 from 48 to 55 units and pm from 36 to 40 units: Lab Results  Component Value Date   HGBA1C 8.8 (H) 11/08/2017      Further, the patient also has history of Vitamin D Deficiency ("39"/2008)  and supplements vitamin D without any suspected side-effects. Current vitamin D is at goa: Lab Results  Component Value Date   VD25OH 66 11/08/2017   Current Outpatient Medications on File Prior to  Visit  Medication Sig  . ACCU-CHEK AVIVA PLUS test strip CHECK BLOOD GLUCOSE 3 TIMES DAILY.  [name] acetaminophen (TYLENOL) 500 MG tablet Take 1,000 mg by mouth every 6 (six) hours as needed for moderate pain or headache.   . ALPRAZolam (XANAX) 1 MG tablet Take 0.5 tablets (0.5 mg total) by mouth at bedtime as needed for sleep.  [name] Alum Hydroxide-Mag Carbonate (GAVISCON PO) Take 4 tablets by mouth daily as needed (acid reflux).   [name] aspirin EC 81 MG tablet Take 81 mg by mouth daily.  [name] atorvastatin (LIPITOR) 40 MG tablet Take 20 mg by mouth every Monday, Wednesday, and Friday.   [name] azelastine (ASTELIN) 0.1 % nasal spray PLACE 2 SPRAYS INTO BOTH NOSTRILS 2 (TWO) TIMES DAILY. USE IN EACH NOSTRIL AS DIRECTED  . B Complex Vitamins (VITAMIN B COMPLEX PO) Take 1 tablet by mouth at bedtime.   . bacitracin 500 UNIT/GM ointment Apply 1 application topically daily as needed for wound care.  . Blood Glucose Monitoring Suppl (ACCU-CHEK AVIVA PLUS) w/Device KIT Check blood sugar 1 time  daily  . Blood Glucose Monitoring Suppl DEVI Test blood sugar up to three times a day or as directed.  . Cholecalciferol (VITAMIN D PO) Take 5,000 Units by mouth 2 (two) times daily.  . colchicine 0.6 MG tablet Take 0.6 mg by mouth daily  as needed (for gout flare up).   . diphenhydrAMINE (BENADRYL) 25 MG tablet Take 25 mg by mouth at bedtime as needed for allergies.   . ferrous sulfate 325 (65 FE) MG tablet Take 325 mg by mouth 2 (two) times daily with a meal.  . HYDROcodone-acetaminophen (NORCO/VICODIN) 5-325 MG tablet Take 1-2 tablets by mouth every 6 (six) hours as needed for moderate pain.   [name] insulin NPH-regular Human (NOVOLIN 70/30) (70-30) 100 UNIT/ML injection Inject into the skin.  [name] insulin NPH-regular Human (NOVOLIN 70/30) (70-30) 100 UNIT/ML injection Inject 25 Units into the skin daily. 15 units AM, 10 units PM  . lidocaine (LIDODERM) 5 % Place 1 patch onto the skin daily as needed (for pain). Remove & Discard patch  within 12 hours or as directed by MD   . loratadine (CLARITIN) 10 MG tablet Take 10 mg by mouth daily as needed for allergies.   . metolazone (ZAROXOLYN) 5 MG tablet TAKE 1 TABLET BY MOUTH  DAILY AS NEEDED (WEIGHT  GAIN AND EDEMA)  . Multiple Vitamin (MULTIVITAMIN WITH MINERALS) TABS tablet Take 1 tablet by mouth at bedtime.   . Multiple Vitamins-Minerals (PRESERVISION AREDS 2) CAPS Take 1 capsule by mouth 2 (two) times daily.   . polyvinyl alcohol (ARTIFICIAL TEARS) 1.4 % ophthalmic solution Place 1 drop into both eyes daily as needed for dry eyes.  . potassium chloride SA (K-DUR,KLOR-CON) 20 MEQ tablet Take 20 mEq by mouth 4 (four) times daily.   [name] PRESCRIPTION MEDICATION Inhale into the lungs at bedtime. CPAP  . sertraline (ZOLOFT) 100 MG tablet TAKE 1 TABLET BY MOUTH  DAILY  . torsemide (DEMADEX) 100 MG tablet Take 100 mg in AM and 50 mg in PM if needed for weight gain (Patient taking differently: Take 50-100 mg by mouth See admin instructions. TAKE 1 TABLET (100 MG) BY MOUTH SCHEDULED IN THE MORNING, AND MAY TAKE 0.5 TABLET (50 MG) IN THE EVENING IF NEEDED FOR SWELLING/WEIGHT GAIN)  . warfarin (COUMADIN) 5 MG tablet Take 7.5-10 mg by mouth See admin instructions. Take 7.5 mg by mouth daily on Sunday. Take 10 mg by mouth daily on all other days  . allopurinol (ZYLOPRIM) 300 MG tablet Take 1 tablet daily to prevent gout (Patient taking differently: Take 300 mg by mouth daily. )   No current facility-administered medications on file prior to visit.    Allergies  Allergen Reactions  . Sunflower Seed [Sunflower Oil] Swelling and Other (See Comments)    Tongue and lip swelling  . Horse-Derived Products Other (See Comments)    Per allergy skin test UNSPECIFIED REACTION   . Tetanus Toxoids Other (See Comments)    Per allergy skin test  . Tetanus Toxoid     Other reaction(s): Other (See Comments) Rash(horse serum)   PMHx:   Past Medical History:  Diagnosis Date  . Adrenal adenoma   .  Anemia   . Anxiety   . Arthritis   . Atypical atrial flutter ([place]) 8/15, 10/15   a. DCCV 08/2013. b. s/p RFA 10/2013.  [name] Basal cell carcinoma   . CAD (coronary artery disease)    a. 04/2013 CABG x 2: LIMA to LAD, SVG to RI, EVH via R thigh.  . Cellulitis 12/2015   left leg  . Chronic diastolic congestive heart failure ([place])   . CKD (chronic kidney disease), stage III ([place])   . Depression   . Diabetes mellitus type II   . Diverticulosis 2001  . DJD (degenerative joint  disease)   . GERD (gastroesophageal reflux disease)   . Gout   . H/O hiatal hernia   . History of cardioversion    x3 (years uncertain)  . Hx of adenomatous colonic polyps   . Hyperlipidemia   . Hypertension   . Hypertensive cardiomyopathy ([place])   . Obstructive sleep apnea    compliant with CPAP  . 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 Persistent atrial fibrillation    a. s/p MAZE 04/2013 in setting of CABG. b. Amio stopped in 10/2013 after flutter ablation.  [name] PFO (patent foramen ovale)    a. Small PFO by TEE 10/2013.  [name] Pleural effusion, left    a. s/p thoracentesis 05/2013.  [name] Respiratory failure ([place])    a. Hypoxia 10/2013 - required supp O2 as inpatient, did not require it at discharge.  . S/P Maze operation for atrial fibrillation    a. 04/2013: Complete bilateral atrial lesion set using cryothermy and bipolar radiofrequency ablation with clipping of LA appendage (@ time of CABG)  . S/P Maze operation for atrial fibrillation 04/05/2013   Complete bilateral atrial lesion set using cryothermy and bipolar radiofrequency ablation with clipping of LA appendage via median sternotomy approach    Immunization History  Administered Date(s) Administered  . DT 08/05/2015  . Influenza Split 10/25/2012  . Influenza, High Dose Seasonal PF 09/26/2013, 09/27/2014, 10/03/2015, 10/05/2016, 11/11/2017  . Pneumococcal  Conjugate-13 01/30/2014  . Pneumococcal Polysaccharide-23 10/20/2011  . Td 01/06/2000   Past Surgical History:  Procedure Laterality Date  . APPENDECTOMY  03/05/2017   laproscopic  . ATRIAL FIBRILLATION ABLATION N/A 10/26/2013   Procedure: ATRIAL FIBRILLATION ABLATION;  Surgeon: Coralyn Mark, MD;  Location: [place] CATH LAB;  Service: Cardiovascular;  Laterality: N/A;  . BASAL CELL CARCINOMA EXCISION     x3 on face  . CARDIAC CATHETERIZATION     myocardial bridge but no cad  . CARDIOVERSION N/A 08/23/2013   Procedure: CARDIOVERSION;  Surgeon: Sanda Klein, MD;  Location: [place];  Service: Cardiovascular;  Laterality: N/A;  . CARPOMETACARPEL SUSPENSION PLASTY Left 02/14/2014   Procedure: CARPOMETACARPEL ([place]) SUSPENSIONPLASTY THUMB  WITH  ABDUCTOR POLLICIS LONGUS TRANSFER AND STENOSING TENOSYNOVITIS RELEASE LEFT WRIST;  Surgeon: Charlotte Crumb, MD;  Location: [place];  Service: Orthopedics;  Laterality: Left;  . CATARACT EXTRACTION     bilateral  . COLONOSCOPY WITH PROPOFOL N/A 01/26/2017   Procedure: COLONOSCOPY WITH PROPOFOL;  Surgeon: Doran Stabler, MD;  Location: [facility] ENDOSCOPY;  Service: Gastroenterology;  Laterality: N/A;  . CORONARY ARTERY BYPASS GRAFT N/A 04/05/2013   Procedure: CORONARY ARTERY BYPASS GRAFTING (CABG) TIMES TWO USING LEFT INTERNAL MAMMARY ARTERY AND RIGHT SAPHENOUS LEG VEIN HARVESTED ENDOSCOPICALLY;  Surgeon: Rexene Alberts, MD;  Location: [place];  Service: Open Heart Surgery;  Laterality: N/A;  . ESOPHAGOGASTRODUODENOSCOPY (EGD) WITH PROPOFOL N/A 01/26/2017   Procedure: ESOPHAGOGASTRODUODENOSCOPY (EGD) WITH PROPOFOL;  Surgeon: Doran Stabler, MD;  Location: [facility]OS[facility];  Service: Gastroenterology;  Laterality: N/A;  . GREAT TOE ARTHRODESIS, INTERPHALANGEAL JOINT     Right foot  . INTRAOPERATIVE TRANSESOPHAGEAL ECHOCARDIOGRAM N/A 04/05/2013   Procedure: INTRAOPERATIVE TRANSESOPHAGEAL ECHOCARDIOGRAM;  Surgeon: Rexene Alberts, MD;  Location:  [place];  Service: Open Heart Surgery;  Laterality: N/A;  . LAPAROSCOPIC APPENDECTOMY N/A 03/05/2017   Procedure: APPENDECTOMY LAPAROSCOPIC;  Surgeon: Judeth Horn, MD;  Location: [place];  Service: General;  Laterality: N/A;  .  LEFT HEART CATHETERIZATION WITH CORONARY ANGIOGRAM N/A 03/07/2013   Procedure: LEFT HEART CATHETERIZATION WITH CORONARY ANGIOGRAM;  Surgeon: Burnell Blanks, MD;  Location: [facility] CATH LAB;  Service: Cardiovascular;  Laterality: N/A;  . MAZE N/A 04/05/2013   Procedure: MAZE;  Surgeon: Rexene Alberts, MD;  Location: [place];  Service: Open Heart Surgery;  Laterality: N/A;  . Polinydal cyst     Removed  . POLYPECTOMY    . Retina repair-right    . RIGHT HEART CATHETERIZATION N/A 05/03/2014   Procedure: RIGHT HEART CATH;  Surgeon: Jolaine Artist, MD;  Location: [facility] CATH LAB;  Service: Cardiovascular;  Laterality: N/A;  . TEE WITHOUT CARDIOVERSION N/A 08/23/2013   Procedure: TRANSESOPHAGEAL ECHOCARDIOGRAM (TEE);  Surgeon: Sanda Klein, MD;  Location: [facility] ENDOSCOPY;  Service: Cardiovascular;  Laterality: N/A;  . TEE WITHOUT CARDIOVERSION N/A 10/26/2013   Procedure: TRANSESOPHAGEAL ECHOCARDIOGRAM (TEE);  Surgeon: Sueanne Margarita, MD;  Location: [facility] ENDOSCOPY;  Service: Cardiovascular;  Laterality: N/A;  . TEE WITHOUT CARDIOVERSION N/A 06/05/2014   Procedure: TRANSESOPHAGEAL ECHOCARDIOGRAM (TEE);  Surgeon: Thayer Headings, MD;  Location: [facility] ENDOSCOPY;  Service: Cardiovascular;  Laterality: N/A;  . TRAPEZIUM RESECTION     FHx:    Reviewed / unchanged  SHx:    Reviewed / unchanged   Systems Review:  Constitutional: Denies fever, chills, wt changes, headaches, insomnia, fatigue, night sweats, change in appetite. Eyes: Denies redness, blurred vision, diplopia, discharge, itchy, watery eyes.  ENT: Denies discharge, congestion, post nasal drip, epistaxis, sore throat, earache, hearing loss, dental pain, tinnitus, vertigo, sinus pain, snoring.  CV: Denies chest pain, palpitations,  irregular heartbeat, syncope, dyspnea, diaphoresis, orthopnea, PND, claudication or edema. Respiratory: denies cough, dyspnea, DOE, pleurisy, hoarseness, laryngitis, wheezing.  Gastrointestinal: Denies dysphagia, odynophagia, heartburn, reflux, water brash, abdominal pain or cramps, nausea, vomiting, bloating, diarrhea, constipation, hematemesis, melena, hematochezia  or hemorrhoids. Genitourinary: Denies dysuria, frequency, urgency, nocturia, hesitancy, discharge, hematuria or flank pain. Musculoskeletal: Denies arthralgias, myalgias, stiffness, jt. swelling, pain, limping or strain/sprain.  Skin: Denies pruritus, rash, hives, warts, acne, eczema or change in skin lesion(s). Neuro: No weakness, tremor, incoordination, spasms, paresthesia or pain. Psychiatric: Denies confusion, memory loss or sensory loss. Endo: Denies change in weight, skin or hair change.  Heme/Lymph: No excessive bleeding, bruising or enlarged lymph nodes.  Physical Exam  BP 104/60   Pulse 92   Temp 97.7 F (36.5 C)   Resp 16   Ht 5' 11"  (1.803 m)   Wt 193 lb 6.4 oz (87.7 kg)   BMI 26.97 kg/m   Appears  well nourished, well groomed  and in no distress.  Eyes: PERRLA, EOMs, conjunctiva no swelling or erythema. Sinuses: No frontal/maxillary tenderness ENT/Mouth: EAC's clear, TM's nl w/o erythema, bulging. Nares clear w/o erythema, swelling, exudates. Oropharynx clear without erythema or exudates. Oral hygiene is good. Tongue normal, non obstructing. Hearing intact.  Neck: Supple. Thyroid not palpable. Car 2+/2+ without bruits, nodes or JVD. Chest: Respirations nl with BS clear & equal w/o rales, rhonchi, wheezing or stridor.  Cor: Heart sounds normal w/ regular rate and rhythm without sig. murmurs, gallops, clicks or rubs. Peripheral pulses normal and equal  without edema.  Abdomen: Soft & bowel sounds normal. Non-tender w/o guarding, rebound, hernias, masses or organomegaly.  Lymphatics: Unremarkable.    Musculoskeletal: Full ROM all peripheral extremities, joint stability, 5/5 strength and normal gait.  Skin: Warm, dry without exposed rashes, lesions or ecchymosis apparent.  Neuro: Cranial nerves intact, reflexes equal bilaterally. Sensory-motor testing grossly intact.  Tendon reflexes grossly intact.  Pysch: Alert & oriented x 3.  Insight and judgement nl & appropriate. No ideations.  Assessment and Plan:  1. Essential hypertension  - Continue medication, monitor blood pressure at home.  - Continue DASH diet.  Reminder to go to the ER if any CP,  SOB, nausea, dizziness, severe HA, changes vision/speech.  - CBC with Differential/Platelet - COMPLETE METABOLIC PANEL WITH GFR - Magnesium - TSH  2. Hyperlipidemia, mixed  - Continue diet/meds, exercise,& lifestyle modifications.  - Continue monitor periodic cholesterol/liver & renal functions   - Lipid panel - TSH  3. Type 2 diabetes mellitus with stage 4 chronic kidney disease, with long-term current use of insulin (HCC)  - Continue diet, exercise,  - lifestyle modifications.  - Monitor appropriate labs.  - Hemoglobin A1c  4. Vitamin D deficiency  - Continue supplementation.  - VITAMIN D 25 Hydroxyl  5. Type 2 diabetes mellitus, uncontrolled, with renal complications (HCC)  - Hemoglobin A1c  6. PAF (paroxysmal atrial fibrillation) (HCC)  - TSH - Protime-INR  7. Chronic gout without tophus, unspecified cause, unspecified site  - Uric acid  8. Encounter for monitoring Coumadin therapy  - Protime-INR  9. Medication management  - CBC with Differential/Platelet - COMPLETE METABOLIC PANEL WITH GFR - Magnesium - Lipid panel - TSH - Hemoglobin A1c - VITAMIN D 25 Hydroxyl - Uric acid       Discussed  regular exercise, BP monitoring, weight control to achieve/maintain BMI less than 25 and discussed med and SE's. Recommended labs to assess and monitor clinical status with further disposition pending results of  labs. Over 30 minutes of exam, counseling, chart review was performed.

## 2017-11-09 LAB — PROTIME-INR
INR: 2.4 — ABNORMAL HIGH
PROTHROMBIN TIME: 23.9 s — AB (ref 9.0–11.5)

## 2017-11-09 LAB — CBC WITH DIFFERENTIAL/PLATELET
BASOS PCT: 0.8 %
Basophils Absolute: 72 cells/uL (ref 0–200)
EOS PCT: 1 %
Eosinophils Absolute: 90 cells/uL (ref 15–500)
HEMATOCRIT: 44 % (ref 38.5–50.0)
HEMOGLOBIN: 14.8 g/dL (ref 13.2–17.1)
LYMPHS ABS: 1035 {cells}/uL (ref 850–3900)
MCH: 32.7 pg (ref 27.0–33.0)
MCHC: 33.6 g/dL (ref 32.0–36.0)
MCV: 97.3 fL (ref 80.0–100.0)
MPV: 9.2 fL (ref 7.5–12.5)
Monocytes Relative: 7.7 %
NEUTROS ABS: 7110 {cells}/uL (ref 1500–7800)
NEUTROS PCT: 79 %
Platelets: 235 10*3/uL (ref 140–400)
RBC: 4.52 10*6/uL (ref 4.20–5.80)
RDW: 12.8 % (ref 11.0–15.0)
Total Lymphocyte: 11.5 %
WBC: 9 10*3/uL (ref 3.8–10.8)
WBCMIX: 693 {cells}/uL (ref 200–950)

## 2017-11-09 LAB — HEMOGLOBIN A1C
HEMOGLOBIN A1C: 8.8 %{Hb} — AB (ref <5.7)
MEAN PLASMA GLUCOSE: 206 (calc)
eAG (mmol/L): 11.4 (calc)

## 2017-11-09 LAB — URIC ACID: Uric Acid, Serum: 7.9 mg/dL (ref 4.0–8.0)

## 2017-11-09 LAB — COMPLETE METABOLIC PANEL WITH GFR
AG Ratio: 1.6 (calc) (ref 1.0–2.5)
ALT: 19 U/L (ref 9–46)
AST: 19 U/L (ref 10–35)
Albumin: 4.8 g/dL (ref 3.6–5.1)
Alkaline phosphatase (APISO): 118 U/L — ABNORMAL HIGH (ref 40–115)
BILIRUBIN TOTAL: 0.7 mg/dL (ref 0.2–1.2)
BUN / CREAT RATIO: 25 (calc) — AB (ref 6–22)
BUN: 55 mg/dL — ABNORMAL HIGH (ref 7–25)
CHLORIDE: 94 mmol/L — AB (ref 98–110)
CO2: 33 mmol/L — ABNORMAL HIGH (ref 20–32)
Calcium: 9.5 mg/dL (ref 8.6–10.3)
Creat: 2.24 mg/dL — ABNORMAL HIGH (ref 0.70–1.18)
GFR, EST AFRICAN AMERICAN: 31 mL/min/{1.73_m2} — AB (ref >=60)
GFR, Est Non African American: 27 mL/min/{1.73_m2} — ABNORMAL LOW (ref >=60)
GLOBULIN: 3 g/dL (ref 1.9–3.7)
Glucose, Bld: 304 mg/dL — ABNORMAL HIGH (ref 65–99)
Potassium: 3.1 mmol/L — ABNORMAL LOW (ref 3.5–5.3)
Sodium: 142 mmol/L (ref 135–146)
TOTAL PROTEIN: 7.8 g/dL (ref 6.1–8.1)

## 2017-11-09 LAB — VITAMIN D 25 HYDROXY (VIT D DEFICIENCY, FRACTURES): VIT D 25 HYDROXY: 66 ng/mL (ref 30–100)

## 2017-11-09 LAB — LIPID PANEL
CHOL/HDL RATIO: 6.1 (calc) — AB (ref <5.0)
Cholesterol: 170 mg/dL (ref <200)
HDL: 28 mg/dL — ABNORMAL LOW (ref >40)
NON-HDL CHOLESTEROL (CALC): 142 mg/dL — AB (ref <130)
Triglycerides: 447 mg/dL — ABNORMAL HIGH (ref <150)

## 2017-11-09 LAB — MAGNESIUM: Magnesium: 2.7 mg/dL — ABNORMAL HIGH (ref 1.5–2.5)

## 2017-11-09 LAB — TSH: TSH: 1.51 mIU/L (ref 0.40–4.50)

## 2017-11-10 DIAGNOSIS — N183 Chronic kidney disease, stage 3 (moderate): Secondary | ICD-10-CM | POA: Diagnosis not present

## 2017-11-11 ENCOUNTER — Ambulatory Visit (INDEPENDENT_AMBULATORY_CARE_PROVIDER_SITE_OTHER): Payer: Medicare Other

## 2017-11-11 ENCOUNTER — Encounter (HOSPITAL_COMMUNITY): Payer: Medicare Other | Admitting: Internal Medicine

## 2017-11-11 VITALS — Temp 97.5°F

## 2017-11-11 DIAGNOSIS — Z23 Encounter for immunization: Secondary | ICD-10-CM | POA: Diagnosis not present

## 2017-11-14 ENCOUNTER — Encounter: Payer: Self-pay | Admitting: Internal Medicine

## 2017-11-16 ENCOUNTER — Ambulatory Visit (HOSPITAL_COMMUNITY)
Admission: RE | Admit: 2017-11-16 | Discharge: 2017-11-16 | Disposition: A | Discharge status: Home / Self Care | Payer: Medicare Other | Source: Ambulatory Visit | Attending: Internal Medicine | Admitting: Internal Medicine

## 2017-11-16 VITALS — BP 118/60 | HR 87 | Wt 191.8 lb

## 2017-11-16 DIAGNOSIS — K219 Gastro-esophageal reflux disease without esophagitis: Secondary | ICD-10-CM | POA: Diagnosis not present

## 2017-11-16 DIAGNOSIS — E1122 Type 2 diabetes mellitus with diabetic chronic kidney disease: Secondary | ICD-10-CM | POA: Diagnosis not present

## 2017-11-16 DIAGNOSIS — Z823 Family history of stroke: Secondary | ICD-10-CM | POA: Diagnosis not present

## 2017-11-16 DIAGNOSIS — D649 Anemia, unspecified: Secondary | ICD-10-CM | POA: Diagnosis not present

## 2017-11-16 DIAGNOSIS — Z85828 Personal history of other malignant neoplasm of skin: Secondary | ICD-10-CM | POA: Insufficient documentation

## 2017-11-16 DIAGNOSIS — Z794 Long term (current) use of insulin: Secondary | ICD-10-CM | POA: Diagnosis not present

## 2017-11-16 DIAGNOSIS — E785 Hyperlipidemia, unspecified: Secondary | ICD-10-CM | POA: Diagnosis not present

## 2017-11-16 DIAGNOSIS — I5032 Chronic diastolic (congestive) heart failure: Secondary | ICD-10-CM

## 2017-11-16 DIAGNOSIS — G4733 Obstructive sleep apnea (adult) (pediatric): Secondary | ICD-10-CM | POA: Insufficient documentation

## 2017-11-16 DIAGNOSIS — Z951 Presence of aortocoronary bypass graft: Secondary | ICD-10-CM | POA: Diagnosis not present

## 2017-11-16 DIAGNOSIS — N184 Chronic kidney disease, stage 4 (severe): Secondary | ICD-10-CM | POA: Diagnosis not present

## 2017-11-16 DIAGNOSIS — I272 Pulmonary hypertension, unspecified: Secondary | ICD-10-CM | POA: Diagnosis not present

## 2017-11-16 DIAGNOSIS — Z8249 Family history of ischemic heart disease and other diseases of the circulatory system: Secondary | ICD-10-CM | POA: Diagnosis not present

## 2017-11-16 DIAGNOSIS — I130 Hypertensive heart and chronic kidney disease with heart failure and stage 1 through stage 4 chronic kidney disease, or unspecified chronic kidney disease: Secondary | ICD-10-CM | POA: Insufficient documentation

## 2017-11-16 DIAGNOSIS — Z79899 Other long term (current) drug therapy: Secondary | ICD-10-CM | POA: Insufficient documentation

## 2017-11-16 DIAGNOSIS — J90 Pleural effusion, not elsewhere classified: Secondary | ICD-10-CM | POA: Insufficient documentation

## 2017-11-16 DIAGNOSIS — I480 Paroxysmal atrial fibrillation: Secondary | ICD-10-CM

## 2017-11-16 DIAGNOSIS — M1990 Unspecified osteoarthritis, unspecified site: Secondary | ICD-10-CM | POA: Diagnosis not present

## 2017-11-16 DIAGNOSIS — I2510 Atherosclerotic heart disease of native coronary artery without angina pectoris: Secondary | ICD-10-CM | POA: Insufficient documentation

## 2017-11-16 DIAGNOSIS — Z7982 Long term (current) use of aspirin: Secondary | ICD-10-CM | POA: Diagnosis not present

## 2017-11-16 DIAGNOSIS — I4510 Unspecified right bundle-branch block: Secondary | ICD-10-CM | POA: Insufficient documentation

## 2017-11-16 DIAGNOSIS — Z7901 Long term (current) use of anticoagulants: Secondary | ICD-10-CM | POA: Diagnosis not present

## 2017-11-16 LAB — BASIC METABOLIC PANEL
ANION GAP: 5 (ref 5–15)
BUN: 40 mg/dL — ABNORMAL HIGH (ref 8–23)
CHLORIDE: 100 mmol/L (ref 98–111)
CO2: 37 mmol/L — ABNORMAL HIGH (ref 22–32)
Calcium: 9.1 mg/dL (ref 8.9–10.3)
Creatinine, Ser: 2.15 mg/dL — ABNORMAL HIGH (ref 0.61–1.24)
GFR calc Af Amer: 32 mL/min — ABNORMAL LOW (ref >60)
GFR, EST NON AFRICAN AMERICAN: 28 mL/min — AB (ref >60)
Glucose, Bld: 110 mg/dL — ABNORMAL HIGH (ref 70–99)
POTASSIUM: 3.3 mmol/L — AB (ref 3.5–5.1)
Sodium: 142 mmol/L (ref 135–145)

## 2017-11-16 LAB — BRAIN NATRIURETIC PEPTIDE: B Natriuretic Peptide: 181.1 pg/mL — ABNORMAL HIGH (ref 0.0–100.0)

## 2017-11-16 NOTE — Progress Notes (Signed)
Patient ID: [name]., male   DOB: 1940/08/10, 77 y.o.   MRN: [id number]   Advanced Heart Failure Clinic Note   Patient ID: [name]., male   DOB: May 30, 1940, 77 y.o.   MRN: [id number]  Primary Cardiologist: [name]  Nephrologist: Carolin Sicks  Subjective: [name] is a 77 y/o male with COPD , DM, PAF, 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, CKD, AFL s/p ablation in 10/15. 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 Had surgery in 4/15. Did well for a while went to cardiac rehab and was feeling fine.  Developed AFL in 10/15 and underwent RFA.   In 3/16 began to develop severe SOB. Started O2. Says his symptoms got worse almost overnight. 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 He has seen Dr. Roxy Manns but felt to have no optimal surgical options for repair. His case was also presented to Dr. Michaelle Birks at [facility] who agreed that there was no way to baffle or reroute the anomalous PV flow to the LA. He had a TEE which showed LVEF 60-65% with a dilated right side and a small PFO. He has also been seen by Dr. Lake Bells who performed PFTs that showed significant restrictive lung disease with a low DLCO. He had f/u with Dr. Gilles Chiquito in the [facility] who felt his symptoms were multifactorial.  Had colonoscopy earlier this year and found to have a polyp and what sounds like AVMs. Subsequently had a laparoscopic removal of appendiceal adenoma which was benign.   Here for routine f/u. Overall doing ok. Over past few days has had URI symptoms. Notes that has more SOB recently and a bit less stamina. Weight relatively stable. Taking torsemide 100 daily and occasionally takes 41m in afternoon. Takes metolazone on occasion about 1-2/week. No edema, orthopnea, PND.   Echo 12/18 LVEF 60% RV dilated mildly HK  Echo 12/17 LVEF 55-60% RV moderately dilated mild HK. RVSP 570mG    PFTs (7/16) FEV1 1.45 L (45%) FVC 1.77 L (40%) DLCO 46%  RHC 4/16 RA = 18 RV = 72/4/17 PA = 76/27 (46) PCW = 21 Fick cardiac output/index (using PA sat) = 9.2/4.45 Thermo CO/CI = 10.0/4.87 PVR = 2.2 WU Fick cardiac output/index (using high SVC sat) = 5.2/2.5 Pulse-ox saturation = 89%  High SVC sat = 54% Low SVC sat = 81% (at SVC/RA junction) RA sat = 68% RV sat = 66% PA sat = 68%, 69% IVC sat =56%   ECGO 1/17: EF 60%. RV dilated with moderately reduced systolic function. RVSP 70. + bubble.  VQ/CT negative for PE   TEE 10/15 small PFO  Ab u/s 6/16 liver normal + ascites. Medico renal kidney disease.  Past Medical History:  Diagnosis Date  . Adrenal adenoma   . Anemia   . Anxiety   . Arthritis   . Atypical atrial flutter ([facility]8/15, 10/15   a. DCCV 08/2013. b. s/p RFA 10/2013.  . Marland Kitchenasal cell carcinoma   . CAD (coronary artery disease)    a. 04/2013 CABG x 2: LIMA to LAD, SVG to RI, EVH via R thigh.  . Cellulitis 12/2015   left leg  . Chronic diastolic congestive heart failure ([facility]  . CKD (chronic kidney disease), stage III ([place]  . Depression   . Diabetes mellitus type II   . Diverticulosis 2001  .  DJD (degenerative joint disease)   . GERD (gastroesophageal reflux disease)   . Gout   . H/O hiatal hernia   . History of cardioversion    x3 (years uncertain)  . Hx of adenomatous colonic polyps   . Hyperlipidemia   . Hypertension   . Hypertensive cardiomyopathy ([place])   . Obstructive sleep apnea    compliant with CPAP  . 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 Persistent atrial fibrillation    a. s/p MAZE 04/2013 in setting of CABG. b. Amio stopped in 10/2013 after flutter ablation.  [name] PFO (patent foramen ovale)    a. Small PFO by TEE 10/2013.  [name] Pleural effusion, left    a. s/p thoracentesis 05/2013.  [name] Respiratory failure ([place])    a.  Hypoxia 10/2013 - required supp O2 as inpatient, did not require it at discharge.  . S/P Maze operation for atrial fibrillation    a. 04/2013: Complete bilateral atrial lesion set using cryothermy and bipolar radiofrequency ablation with clipping of LA appendage (@ time of CABG)  . S/P Maze operation for atrial fibrillation 04/05/2013   Complete bilateral atrial lesion set using cryothermy and bipolar radiofrequency ablation with clipping of LA appendage via median sternotomy approach     Current Outpatient Medications  Medication Sig Dispense Refill  . ACCU-CHEK AVIVA PLUS test strip CHECK BLOOD GLUCOSE 3 TIMES DAILY. 300 each 1  . acetaminophen (TYLENOL) 500 MG tablet Take 1,000 mg by mouth every 6 (six) hours as needed for moderate pain or headache.     . Alum Hydroxide-Mag Carbonate (GAVISCON PO) Take 4 tablets by mouth daily as needed (acid reflux).     [name] aspirin EC 81 MG tablet Take 81 mg by mouth daily.    [name] atorvastatin (LIPITOR) 40 MG tablet Take 20 mg by mouth every Monday, Wednesday, and Friday.     [name] azelastine (ASTELIN) 0.1 % nasal spray PLACE 2 SPRAYS INTO BOTH NOSTRILS 2 (TWO) TIMES DAILY. USE IN EACH NOSTRIL AS DIRECTED 90 mL 11  . Blood Glucose Monitoring Suppl (ACCU-CHEK AVIVA PLUS) w/Device KIT Check blood sugar 1 time  daily 1 kit 0  . Blood Glucose Monitoring Suppl DEVI Test blood sugar up to three times a day or as directed. 1 each 0  . carvedilol (COREG) 6.25 MG tablet Take 1 tablet (6.25 mg total) by mouth 3 (three) times daily. 270 tablet 1  . Cholecalciferol (VITAMIN D PO) Take 5,000 Units by mouth 2 (two) times daily.    . colchicine 0.6 MG tablet Take 0.6 mg by mouth daily as needed (for gout flare up).     . diphenhydrAMINE (BENADRYL) 25 MG tablet Take 25 mg by mouth at bedtime as needed for allergies.     . ferrous sulfate 325 (65 FE) MG tablet Take 325 mg by mouth 2 (two) times daily with a meal.    . fluticasone (FLONASE) 50 MCG/ACT nasal spray Place 1 spray into  both nostrils daily as needed for allergies. 16 g 3  . insulin NPH-regular Human (NOVOLIN 70/30) (70-30) 100 UNIT/ML injection Inject 25 Units into the skin daily. 15 units AM, 10 units PM    . lidocaine (LIDODERM) 5 % Place 1 patch onto the skin daily as needed (for pain). Remove & Discard patch within 12 hours or as directed by MD     . loratadine (CLARITIN) 10 MG tablet Take  10 mg by mouth daily as needed for allergies.     . metolazone (ZAROXOLYN) 5 MG tablet TAKE 1 TABLET BY MOUTH  DAILY AS NEEDED (WEIGHT  GAIN AND EDEMA) 20 tablet 0  . Multiple Vitamin (MULTIVITAMIN WITH MINERALS) TABS tablet Take 1 tablet by mouth at bedtime.     . Multiple Vitamins-Minerals (PRESERVISION AREDS 2) CAPS Take 1 capsule by mouth 2 (two) times daily.     . polyvinyl alcohol (ARTIFICIAL TEARS) 1.4 % ophthalmic solution Place 1 drop into both eyes daily as needed for dry eyes.    . potassium chloride SA (K-DUR,KLOR-CON) 20 MEQ tablet Take 20 mEq by mouth 4 (four) times daily.     [name] PRESCRIPTION MEDICATION Inhale into the lungs at bedtime. CPAP    . sertraline (ZOLOFT) 100 MG tablet TAKE 1 TABLET BY MOUTH  DAILY 90 tablet 3  . torsemide (DEMADEX) 100 MG tablet Take 100 mg in AM and 50 mg in PM if needed for weight gain (Patient taking differently: Take 50-100 mg by mouth See admin instructions. TAKE 1 TABLET (100 MG) BY MOUTH SCHEDULED IN THE MORNING, AND MAY TAKE 0.5 TABLET (50 MG) IN THE EVENING IF NEEDED FOR SWELLING/WEIGHT GAIN) 180 tablet 3  . warfarin (COUMADIN) 5 MG tablet Take 7.5-10 mg by mouth See admin instructions. Take 7.5 mg by mouth daily on Sunday. Take 10 mg by mouth daily on all other days    . allopurinol (ZYLOPRIM) 300 MG tablet Take 1 tablet daily to prevent gout (Patient taking differently: Take 300 mg by mouth daily. ) 90 tablet 1  . ALPRAZolam (XANAX) 1 MG tablet Take 0.5 tablets (0.5 mg total) by mouth at bedtime as needed for sleep. (Patient not taking: Reported on 11/16/2017) 60 tablet 0  .  B Complex Vitamins (VITAMIN B COMPLEX PO) Take 1 tablet by mouth at bedtime.     . bacitracin 500 UNIT/GM ointment Apply 1 application topically daily as needed for wound care.    [name] HYDROcodone-acetaminophen (NORCO/VICODIN) 5-325 MG tablet Take 1-2 tablets by mouth every 6 (six) hours as needed for moderate pain.      No current facility-administered medications for this encounter.     Allergies  Allergen Reactions  . Sunflower Seed [Sunflower Oil] Swelling and Other (See Comments)    Tongue and lip swelling  . Horse-Derived Products Other (See Comments)    Per allergy skin test UNSPECIFIED REACTION   . Tetanus Toxoids Other (See Comments)    Per allergy skin test  . Tetanus Toxoid     Other reaction(s): Other (See Comments) Rash(horse serum)      Social History   Socioeconomic History  . Marital status: Married    Spouse name: Not on file  . Number of children: 1  . Years of education: Not on file  . Highest education level: Not on file  Occupational History  . Occupation: retired Software engineer  Social Needs  . Financial resource strain: Not on file  . Food insecurity:    Worry: Not on file    Inability: Not on file  . Transportation needs:    Medical: Not on file    Non-medical: Not on file  Tobacco Use  . Smoking status: Former Smoker    Packs/day: 4.00    Years: 25.00    Pack years: 100.00    Types: Cigarettes    Last attempt to quit: 01/05/1981    Years since quitting: 36.8  . Smokeless tobacco: Never Used  Substance and Sexual Activity  . Alcohol use: Yes    Alcohol/week: 1.0 standard drinks    Types: 1 Shots of liquor per week    Comment: 1-5 drinks per week  . Drug use: No  . Sexual activity: Not on file  Lifestyle  . Physical activity:    Days per week: Not on file    Minutes per session: Not on file  . Stress: Not on file  Relationships  . Social connections:    Talks on phone: Not on file    Gets together: Not on file    Attends religious  service: Not on file    Active member of club or organization: Not on file    Attends meetings of clubs or organizations: Not on file    Relationship status: Not on file  . Intimate partner violence:    Fear of current or ex partner: Not on file    Emotionally abused: Not on file    Physically abused: Not on file    Forced sexual activity: Not on file  Other Topics Concern  . Not on file  Social History Narrative   Daily caffeine-yes   Patient gets regular exercise.   Pt lives in [place] with spouse.  Retired Software engineer.  Family History  Problem Relation Age of Onset  . Dementia Father   . Colon cancer Mother        Family History/Uncle   . Colon polyps Mother        Family History  . Atrial fibrillation Mother   . Hypertension Mother   . Colon polyps Sister        Family history  . Diabetes Maternal Uncle    . Stroke Paternal Uncle     Vitals:   11/16/17 1152  BP: 118/60  Pulse: 87  SpO2: 91%  Weight: 87 kg (191 lb 12.8 oz)   Wt Readings from Last 3 Encounters:  11/16/17 87 kg (191 lb 12.8 oz)  11/08/17 87.7 kg (193 lb 6.4 oz)  09/13/17 88.5 kg (195 lb)     PHYSICAL EXAM: General:  Well appearing. No resp difficulty HEENT: normal Neck: supple. JVP 5-6 Carotids 2+ bilat; no bruits. No lymphadenopathy or thryomegaly appreciated. Cor: PMI nondisplaced. Regular rate & rhythm. No rubs, gallops or murmurs. Lungs: clear Abdomen: soft, nontender, nondistended. No hepatosplenomegaly. No bruits or masses. Good bowel sounds. Extremities: no cyanosis, clubbing, rash, edema Neuro: alert & orientedx3, cranial nerves grossly intact. moves all 4 extremities w/o difficulty. Affect pleasant  ASSESSMENT  1. Chronic diastolic HF with R>>L symptoms - NYHA II - Volume status looks good. Suspect he may have gained a few pounds of non-fluid weight and he is pushing his dry weight too much. - Continue torsemide 100 mg daily. Continu metolazone as needed. Have asked him to raise his dry weight a bit and limit metolazone use.  - Recheck labs today 2. RV failure & Pulmonary HTN - Has large left to right shunt through large anomalous pulmonary vein into SVC - Have reviewed with TCTS and Dr. Michaelle Birks at [facility]. No way to repair or baffle. Continue medical therapy 3. CAD s/p CABG - No s/s ischemia.  - Continue statin. Off ASA due to warfarin 4. AF s/p Maze  - In NSR today.   - On coumadin. Has been on DOAC in past and didn't like it because of bleeding 5. AFL s/p ablation 10/15 6. Acute on CKD, stage 3-4, baseline creatinine 1.7-1.8 - Recent creatinine 2.24 - Recheck today. - Limit metolazone use 7. Anemia - Up from previous. Now 14.8 - Had colon/EGD in 3/19. Followed by GI 8. RBBB - Stable   Glori Bickers, MD  12:12 PM

## 2017-11-16 NOTE — Patient Instructions (Signed)
Labs done today  We will contact you in 6 months to schedule your next appointment.

## 2017-11-22 ENCOUNTER — Ambulatory Visit (INDEPENDENT_AMBULATORY_CARE_PROVIDER_SITE_OTHER): Payer: Medicare Other | Admitting: Physician Assistant

## 2017-11-22 VITALS — BP 118/64 | HR 72 | Temp 97.9°F | Resp 14 | Wt 195.6 lb

## 2017-11-22 DIAGNOSIS — J90 Pleural effusion, not elsewhere classified: Secondary | ICD-10-CM

## 2017-11-22 DIAGNOSIS — I5032 Chronic diastolic (congestive) heart failure: Secondary | ICD-10-CM | POA: Diagnosis not present

## 2017-11-22 DIAGNOSIS — J984 Other disorders of lung: Secondary | ICD-10-CM | POA: Diagnosis not present

## 2017-11-22 DIAGNOSIS — I480 Paroxysmal atrial fibrillation: Secondary | ICD-10-CM | POA: Diagnosis not present

## 2017-11-22 MED ORDER — BENZONATATE 200 MG PO CAPS: 200.0000 mg | ORAL_CAPSULE | Freq: Three times a day (TID) | ORAL | 0 refills | Status: DC | PRN

## 2017-11-22 NOTE — Patient Instructions (Signed)
Increase flonase twice a day I'm going to give cough pills to take to help quiet the cough Continue allegra Do sugar free hard candy.  Try to rest/sleep Can try mucinex Continue saline spray twice a day with the flonase Get a humidifier for the CPAP  If not better we will send in a zpak and I want you to cut your coumadin in half while you are on it  COLD INFORMATION  Try just the allergy pill and prednisone for a few days, you always want to give your body 10 days to fight off infection with help before taking an anabiotic.   BEWARE ANTIBIOTICS Antibiotics have been linked with colon infections, resistance and newest theory is colon cancer in 40-50 year olds. So it is VERY important to try to avoid them, antibiotics are NOT risk free medications.   If you are not feeling better make an office visit OR CONTACT us.   Here is more info below HOW TO TREAT VIRAL COUGH AND COLD SYMPTOMS:  -Symptoms usually last at least 1 week with the worst symptoms being around day 4.  - colds usually start with a sore throat and end with a cough, and the cough can take 2 weeks to get better.  -No antibiotics are needed for colds, flu, sore throats, cough, bronchitis UNLESS symptoms are longer than 7 days OR if you are getting better then get drastically worse.  -There are a lot of combination medications (Dayquil, Nyquil, Vicks 44, tyelnol cold and sinus, ETC). Please look at the ingredients on the back so that you are treating the correct symptoms and not doubling up on medications/ingredients.    Medicines you can use  Nasal congestion  Little Remedies saline spray (aerosol/mist)- can try this, it is in the kids section - pseudoephedrine (Sudafed)- behind the counter, do not use if you have high blood pressure, medicine that have -D in them.  - phenylephrine (Sudafed PE) -Dextormethorphan + chlorpheniramine (Coridcidin HBP)- okay if you have high blood pressure -Oxymetazoline (Afrin) nasal spray-  LIMIT to 3 days -Saline nasal spray -Neti pot (used distilled or bottled water)  Ear pain/congestion  -pseudoephedrine (sudafed) - Nasonex/flonase nasal spray  Fever  -Acetaminophen (Tyelnol) -Ibuprofen (Advil, motrin, aleve)  Sore Throat  -Acetaminophen (Tyelnol) -Ibuprofen (Advil, motrin, aleve) -Drink a lot of water -Gargle with salt water - Rest your voice (don't talk) -Throat sprays -Cough drops  Body Aches  -Acetaminophen (Tyelnol) -Ibuprofen (Advil, motrin, aleve)  Headache  -Acetaminophen (Tyelnol) -Ibuprofen (Advil, motrin, aleve) - Exedrin, Exedrin Migraine  Allergy symptoms (cough, sneeze, runny nose, itchy eyes) -Claritin or loratadine cheapest but likely the weakest  -Zyrtec or certizine at night because it can make you sleepy -The strongest is allegra or fexafinadine  Cheapest at [employer], [employer], [employer]  Cough  -Dextromethorphan (Delsym)- medicine that has DM in it -Guafenesin (Mucinex/Robitussin) - cough drops - drink lots of water  Chest Congestion  -Guafenesin (Mucinex/Robitussin)  Red Itchy Eyes  - Naphcon-A  Upset Stomach  - Bland diet (nothing spicy, greasy, fried, and high acid foods like tomatoes, oranges, berries) -OKAY- cereal, bread, soup, crackers, rice -Eat smaller more frequent meals -reduce caffeine, no alcohol -Loperamide (Imodium-AD) if diarrhea -Prevacid for heart burn  General health when sick  -Hydration -wash your hands frequently -keep surfaces clean -change pillow cases and sheets often -Get fresh air but do not exercise strenuously -Vitamin D, double up on it - Vitamin C -Zinc

## 2017-11-22 NOTE — Progress Notes (Signed)
Subjective:    Patient ID: [name]., male    DOB: 1940-01-20, 77 y.o.   MRN: [id number]  HPI 77 y.o. WM with hx of CHF, restrictuve lung disease, afib presents with cough x 1 week. He had the flu shot 1 week ago.  Started out with raw throat, sinus issues, then Monday started cough, worse with bending over, unproductive. No fever, chills. Has been taking robitussin DM and hydrocodone 1 night due to cough. Switched from claritin to [employer] and he is still on the flonase.   He is on coumadin.  Lab Results  Component Value Date   INR 2.4 (H) 11/08/2017   INR 2.8 (H) 09/13/2017   INR 2.5 (H) 07/27/2017   Weight is stable, just saw cardiology.  Wt Readings from Last 5 Encounters:  11/22/17 195 lb 9.6 oz (88.7 kg)  11/16/17 191 lb 12.8 oz (87 kg)  11/08/17 193 lb 6.4 oz (87.7 kg)  09/13/17 195 lb (88.5 kg)  07/27/17 191 lb (86.6 kg)    Blood pressure 118/64, pulse 72, temperature 97.9 F (36.6 C), resp. rate 14, weight 195 lb 9.6 oz (88.7 kg), SpO2 99 %.  Medications Current Outpatient Medications on File Prior to Visit  Medication Sig  . ACCU-CHEK AVIVA PLUS test strip CHECK BLOOD GLUCOSE 3 TIMES DAILY.  [name] acetaminophen (TYLENOL) 500 MG tablet Take 1,000 mg by mouth every 6 (six) hours as needed for moderate pain or headache.   . allopurinol (ZYLOPRIM) 300 MG tablet Take 1 tablet daily to prevent gout (Patient taking differently: Take 300 mg by mouth daily. )  . ALPRAZolam (XANAX) 1 MG tablet Take 0.5 tablets (0.5 mg total) by mouth at bedtime as needed for sleep. (Patient not taking: Reported on 11/16/2017)  . Alum Hydroxide-Mag Carbonate (GAVISCON PO) Take 4 tablets by mouth daily as needed (acid reflux).   [name] aspirin EC 81 MG tablet Take 81 mg by mouth daily.  [name] atorvastatin (LIPITOR) 40 MG tablet Take 20 mg by mouth every Monday, Wednesday, and Friday.   [name] azelastine (ASTELIN) 0.1 % nasal spray PLACE 2 SPRAYS INTO BOTH NOSTRILS 2 (TWO) TIMES DAILY. USE IN EACH NOSTRIL AS  DIRECTED  . B Complex Vitamins (VITAMIN B COMPLEX PO) Take 1 tablet by mouth at bedtime.   . bacitracin 500 UNIT/GM ointment Apply 1 application topically daily as needed for wound care.  . Blood Glucose Monitoring Suppl (ACCU-CHEK AVIVA PLUS) w/Device KIT Check blood sugar 1 time  daily  . Blood Glucose Monitoring Suppl DEVI Test blood sugar up to three times a day or as directed.  . carvedilol (COREG) 6.25 MG tablet Take 1 tablet (6.25 mg total) by mouth 3 (three) times daily.  . Cholecalciferol (VITAMIN D PO) Take 5,000 Units by mouth 2 (two) times daily.  . colchicine 0.6 MG tablet Take 0.6 mg by mouth daily as needed (for gout flare up).   . diphenhydrAMINE (BENADRYL) 25 MG tablet Take 25 mg by mouth at bedtime as needed for allergies.   . ferrous sulfate 325 (65 FE) MG tablet Take 325 mg by mouth 2 (two) times daily with a meal.  . fluticasone (FLONASE) 50 MCG/ACT nasal spray Place 1 spray into both nostrils daily as needed for allergies.  [name] HYDROcodone-acetaminophen (NORCO/VICODIN) 5-325 MG tablet Take 1-2 tablets by mouth every 6 (six) hours as needed for moderate pain.   [name] insulin NPH-regular Human (NOVOLIN 70/30) (70-30) 100 UNIT/ML injection Inject 25 Units into the skin daily. 15  units AM, 10 units PM  . lidocaine (LIDODERM) 5 % Place 1 patch onto the skin daily as needed (for pain). Remove & Discard patch within 12 hours or as directed by MD   . loratadine (CLARITIN) 10 MG tablet Take 10 mg by mouth daily as needed for allergies.   . metolazone (ZAROXOLYN) 5 MG tablet TAKE 1 TABLET BY MOUTH  DAILY AS NEEDED (WEIGHT  GAIN AND EDEMA)  . Multiple Vitamin (MULTIVITAMIN WITH MINERALS) TABS tablet Take 1 tablet by mouth at bedtime.   . Multiple Vitamins-Minerals (PRESERVISION AREDS 2) CAPS Take 1 capsule by mouth 2 (two) times daily.   . polyvinyl alcohol (ARTIFICIAL TEARS) 1.4 % ophthalmic solution Place 1 drop into both eyes daily as needed for dry eyes.  . potassium chloride SA  (K-DUR,KLOR-CON) 20 MEQ tablet Take 20 mEq by mouth 4 (four) times daily.   [name] PRESCRIPTION MEDICATION Inhale into the lungs at bedtime. CPAP  . sertraline (ZOLOFT) 100 MG tablet TAKE 1 TABLET BY MOUTH  DAILY  . torsemide (DEMADEX) 100 MG tablet Take 100 mg in AM and 50 mg in PM if needed for weight gain (Patient taking differently: Take 50-100 mg by mouth See admin instructions. TAKE 1 TABLET (100 MG) BY MOUTH SCHEDULED IN THE MORNING, AND MAY TAKE 0.5 TABLET (50 MG) IN THE EVENING IF NEEDED FOR SWELLING/WEIGHT GAIN)  . warfarin (COUMADIN) 5 MG tablet Take 7.5-10 mg by mouth See admin instructions. Take 7.5 mg by mouth daily on Sunday. Take 10 mg by mouth daily on all other days   No current facility-administered medications on file prior to visit.     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; Type 2 diabetes mellitus with stage 4 chronic kidney disease, with long-term current use of insulin ([place]); GERD; DJD (degenerative joint disease); Pleural effusion on left; Vitamin D deficiency; Encounter for monitoring Coumadin therapy; Chronic anticoagulation; Essential hypertension; CAD (coronary artery disease); PFO (patent foramen ovale); 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[place]); OSA on CPAP; S/P Maze operation for atrial fibrillation; Pulmonary hypertension ([place]); 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   Review of Systems  Constitutional: Negative for chills, diaphoresis and fever.  HENT: Positive for congestion, sinus pressure and sore throat. Negative for ear  pain, postnasal drip, rhinorrhea, sneezing, trouble swallowing and voice change.   Eyes: Negative.   Respiratory: Positive for cough. Negative for chest tightness, shortness of breath and wheezing.   Cardiovascular: Negative.   Gastrointestinal: Negative.   Genitourinary: Negative.   Musculoskeletal: Negative.  Negative for neck pain.  Neurological: Negative.  Negative for headaches.       Objective:   Physical Exam  Constitutional: He is oriented to person, place, and time. He appears well-developed and well-nourished.  HENT:  Head: Normocephalic and atraumatic.  Right Ear: Hearing and tympanic membrane normal.  Left Ear: Hearing and tympanic membrane normal.  Nose: Right sinus exhibits maxillary sinus tenderness. Left sinus exhibits maxillary sinus tenderness.  Mouth/Throat: Uvula is midline and mucous membranes are normal. Posterior oropharyngeal erythema present. No oropharyngeal exudate, posterior oropharyngeal edema or tonsillar abscesses.  Eyes: Pupils are equal, round, and reactive  to light. Conjunctivae are normal.  Neck: Normal range of motion. Neck supple.  Cardiovascular: Normal rate and regular rhythm.  Pulmonary/Chest: Effort normal and breath sounds normal. No stridor. He has no wheezes. He has no rales.  distant lung sounds  Abdominal: Soft. Bowel sounds are normal.  Musculoskeletal: Normal range of motion.  Lymphadenopathy:    He has no cervical adenopathy.  Neurological: He is alert and oriented to person, place, and time.  Skin: Skin is warm and dry. No rash noted.        Assessment & Plan:    PAF (paroxysmal atrial fibrillation) (HCC) Monitor, continue coumadin  Chronic diastolic congestive heart failure (HCC) Weight stable No edema, PND  Cough Likely viral/inflammation Lung CTAB, no fever, chills Continue allegra, flonase, saline spray, tessalon If worse call for zpak -     benzonatate (TESSALON) 200 MG capsule; Take 1 capsule (200 mg total) by  mouth 3 (three) times daily as needed for cough (Max: 6109m per day).   The patient was advised to call immediately if he has any concerning symptoms in the interval. The patient voices understanding of current treatment options and is in agreement with the current care plan.The patient knows to call the clinic with any problems, questions or concerns or go to the ER if any further progression of symptoms.

## 2017-12-05 ENCOUNTER — Other Ambulatory Visit: Payer: Self-pay | Admitting: Adult Health

## 2017-12-06 ENCOUNTER — Other Ambulatory Visit: Payer: Self-pay | Admitting: Internal Medicine

## 2017-12-06 DIAGNOSIS — Q2112 Patent foramen ovale: Secondary | ICD-10-CM

## 2017-12-06 DIAGNOSIS — I480 Paroxysmal atrial fibrillation: Secondary | ICD-10-CM

## 2017-12-06 DIAGNOSIS — Q211 Atrial septal defect: Secondary | ICD-10-CM

## 2017-12-06 MED ORDER — WARFARIN SODIUM 5 MG PO TABS: ORAL_TABLET | ORAL | 3 refills | Status: DC

## 2017-12-10 NOTE — Progress Notes (Signed)
Assessment and Plan:  PAF (paroxysmal atrial fibrillation) (HCC) Check INR and will adjust medication according to labs.  Discussed if patient falls to immediately contact office or go to ER. Discussed foods that can increase or decrease Coumadin levels. Patient understands to call the office before starting a new medication. Follow up in one month.   Chronic diastolic congestive heart failure (HCC) Weights stable/down Emphasized salt restriction, less than 206m a day. Encouraged daily monitoring of the patient's weight, call office if 5 lb weight loss or gain in a day.  Encouraged regular exercise. If any increasing shortness of breath, swelling, or chest pressure go to ER immediately.  decrease your fluid intake to less than 2 L daily please remember to always increase your potassium intake with any increase of your fluid pill.   CKD (chronic kidney disease) stage 3, GFR 30-59 ml/min (HCC) Increase fluids, avoid NSAIDS, monitor sugars, will monitor BMP/GFR  Diabetes Doing well on insulin - taking 45 units AM, 35 units PM  - having some hypoglycemia, back down to 42 and 32 units if continues to have low episodes.  Information for hypoglycemia management discussed and provided.  Keep a glucose log and present at next visit   RLE cramp/spasm Check labs for electrolytes, anemia Increase daytime activity, walking, gentle stretching/ROM, encouraged regular hydration, remember to take extra potassium with metolazone   Nose bleeds Cut down on flonase, encouraged saline spray, OTC phenylephrine, avoid excessive nose blowing Apply pressure, tilt head forward; present to ED if bleeding not stopping, or getting dizzy Can refer to ENT for eval if persistent/recurrent episodes, but last was 9 days ago, monitor for now, check CBC, PT/INR  Further disposition pending results of labs. Discussed med's effects and SE's.   Over 30 minutes of exam, counseling, chart review, and critical decision  making was performed.   Future Appointments  Date Time Provider [place] 01/17/2018 11:00 AM CVicie Mutters PA-C [facility] None  02/10/2018  8:00 AM MHayden Pedro MD [facility] None  02/22/2018  2:30 PM MUnk Pinto MD [facility] None  05/18/2018  2:00 PM MUnk Pinto MD [facility] None  09/19/2018  3:00 PM CLiane Comber NP [facility] None    ------------------------------------------------------------------------------------------------------------------   HPI BP 106/68   Pulse 86   Temp (!) 97.5 F (36.4 C)   Ht 5' 11" (1.803 m)   Wt 194 lb (88 kg)   SpO2 93%   BMI 27.06 kg/m   77 y.o.male presents for follow up on coumadin, CHF, CKD III/IV, T2DM.   Today he is concerned about recurrent RLL cramping, worse over the last week. He reports this is ongoing for several years, took matalozone this past Saturday and has had worse cramps than usual since.   His blood pressure has been controlled at home, today their BP is BP: 106/68  He does not workout. He denies chest pain, shortness of breath, dizziness.  He has a history of Diastolic CHF, denies dyspnea on exertion, orthopnea, paroxysmal nocturnal dyspnea and edema. Positive for none. He reports home weights got up to 195 lb on home scale this past weekend, took metalozone dose on Saturday and got down to 189 lb on home scale Sunday, this AM was 186.2lb on home scale.  Wt Readings from Last 3 Encounters:  12/13/17 194 lb (88 kg)  11/22/17 195 lb 9.6 oz (88.7 kg)  11/16/17 191 lb 12.8 oz (87 kg)   Patient is on Coumadin for PAF (paroxysmal atrial fibrillation) (HCC) [I48.0] Patient's last  INR is  Lab Results  Component Value Date   INR 2.4 (H) 11/08/2017   INR 2.8 (H) 09/13/2017   INR 2.5 (H) 07/27/2017    Patient denies SOB, CP, dizziness, and blood in stool/urine. He does endorse a few nose bleeds, [place] has been blowing nose. Bleeding stopped after 30 min of  pressure. His coumadin dose was not changed last visit. He has not taken ABX, has not missed any doses and denies a fall.    Current dose: 2 tabs of 5 mg 6 days a week, then 1.5 tabs 1 day a week.    He has been working on diet and exercise (as tolerated) for T2 diabetes, and denies foot ulcerations, hypoglycemia , increased appetite, nausea, paresthesia of the feet, polydipsia, polyuria, visual disturbances, vomiting and weight loss. He is gradually increasing insulin, currently up to 45 units AM, 35 units PM. He does checking AM glucose, ranging 93-167 over this past week. He did have 1 episode of low blood glucose at 78, did have hypoglycemic awareness. Last A1C in the office was:  Lab Results  Component Value Date   HGBA1C 8.8 (H) 11/08/2017   Lab Results  Component Value Date   GFRNONAA 28 (L) 11/16/2017    Lab Results  Component Value Date   WBC 9.0 11/08/2017   HGB 14.8 11/08/2017   HCT 44.0 11/08/2017   MCV 97.3 11/08/2017   PLT 235 11/08/2017       Past Medical History:  Diagnosis Date  . Adrenal adenoma   . Anemia   . Anxiety   . Arthritis   . Atypical atrial flutter ([place]) 8/15, 10/15   a. DCCV 08/2013. b. s/p RFA 10/2013.  [name] Basal cell carcinoma   . CAD (coronary artery disease)    a. 04/2013 CABG x 2: LIMA to LAD, SVG to RI, EVH via R thigh.  . Cellulitis 12/2015   left leg  . Chronic diastolic congestive heart failure ([place])   . CKD (chronic kidney disease), stage III ([place])   . Depression   . Diabetes mellitus type II   . Diverticulosis 2001  . DJD (degenerative joint disease)   . GERD (gastroesophageal reflux disease)   . Gout   . H/O hiatal hernia   . History of cardioversion    x3 (years uncertain)  . Hx of adenomatous colonic polyps   . Hyperlipidemia   . Hypertension   . Hypertensive cardiomyopathy ([place])   . Obstructive sleep apnea    compliant with CPAP  . 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 Persistent atrial fibrillation    a. s/p MAZE 04/2013 in setting of CABG. b. Amio stopped in 10/2013 after flutter ablation.  [name] PFO (patent foramen ovale)    a. Small PFO by TEE 10/2013.  [name] Pleural effusion, left    a. s/p thoracentesis 05/2013.  [name] Respiratory failure ([place])    a. Hypoxia 10/2013 - required supp O2 as inpatient, did not require it at discharge.  . S/P Maze operation for atrial fibrillation    a. 04/2013: Complete bilateral atrial lesion set using cryothermy and bipolar radiofrequency ablation with clipping of LA appendage (@ time of CABG)  . S/P Maze operation for atrial fibrillation 04/05/2013   Complete bilateral atrial lesion set using cryothermy and bipolar radiofrequency ablation with clipping of LA appendage via median  sternotomy approach      Allergies  Allergen Reactions  . Sunflower Seed [Sunflower Oil] Swelling and Other (See Comments)    Tongue and lip swelling  . Horse-Derived Products Other (See Comments)    Per allergy skin test UNSPECIFIED REACTION   . Tetanus Toxoids Other (See Comments)    Per allergy skin test  . Tetanus Toxoid     Other reaction(s): Other (See Comments) Rash(horse serum)    Current Outpatient Medications on File Prior to Visit  Medication Sig  . ACCU-CHEK AVIVA PLUS test strip CHECK BLOOD GLUCOSE 3 TIMES DAILY.  [name] acetaminophen (TYLENOL) 500 MG tablet Take 1,000 mg by mouth every 6 (six) hours as needed for moderate pain or headache.   . allopurinol (ZYLOPRIM) 300 MG tablet Take 1 tablet daily to prevent gout (Patient taking differently: Take 300 mg by mouth daily. )  . ALPRAZolam (XANAX) 1 MG tablet Take 0.5 tablets (0.5 mg total) by mouth at bedtime as needed for sleep.  [name] Alum Hydroxide-Mag Carbonate (GAVISCON PO) Take 4 tablets by mouth daily as needed (acid reflux).   [name] aspirin EC 81 MG tablet Take 81 mg by mouth daily.  [name] atorvastatin (LIPITOR) 40 MG tablet Take 20 mg by  mouth every Monday, Wednesday, and Friday.   [name] azelastine (ASTELIN) 0.1 % nasal spray PLACE 2 SPRAYS INTO BOTH NOSTRILS 2 (TWO) TIMES DAILY. USE IN EACH NOSTRIL AS DIRECTED  . B Complex Vitamins (VITAMIN B COMPLEX PO) Take 1 tablet by mouth at bedtime.   . bacitracin 500 UNIT/GM ointment Apply 1 application topically daily as needed for wound care.  . benzonatate (TESSALON) 200 MG capsule Take 1 capsule (200 mg total) by mouth 3 (three) times daily as needed for cough (Max: 6109m per day).  . Blood Glucose Monitoring Suppl (ACCU-CHEK AVIVA PLUS) w/Device KIT Check blood sugar 1 time  daily  . Blood Glucose Monitoring Suppl DEVI Test blood sugar up to three times a day or as directed.  . carvedilol (COREG) 6.25 MG tablet Take 1 tablet (6.25 mg total) by mouth 3 (three) times daily.  . Cholecalciferol (VITAMIN D PO) Take 5,000 Units by mouth 2 (two) times daily.  . colchicine 0.6 MG tablet Take 0.6 mg by mouth daily as needed (for gout flare up).   . diphenhydrAMINE (BENADRYL) 25 MG tablet Take 25 mg by mouth at bedtime as needed for allergies.   . ferrous sulfate 325 (65 FE) MG tablet Take 325 mg by mouth 2 (two) times daily with a meal.  . fluticasone (FLONASE) 50 MCG/ACT nasal spray Place 1 spray into both nostrils daily as needed for allergies.  .Marland KitchenHYDROcodone-acetaminophen (NORCO/VICODIN) 5-325 MG tablet Take 1-2 tablets by mouth every 6 (six) hours as needed for moderate pain.   .Marland Kitcheninsulin NPH-regular Human (NOVOLIN 70/30) (70-30) 100 UNIT/ML injection Inject 25 Units into the skin daily. 15 units AM, 10 units PM  . lidocaine (LIDODERM) 5 % Place 1 patch onto the skin daily as needed (for pain). Remove & Discard patch within 12 hours or as directed by MD   . loratadine (CLARITIN) 10 MG tablet Take 10 mg by mouth daily as needed for allergies.   . metolazone (ZAROXOLYN) 5 MG tablet TAKE 1 TABLET BY MOUTH  DAILY AS NEEDED (WEIGHT  GAIN AND EDEMA)  . Multiple Vitamin (MULTIVITAMIN WITH MINERALS)  TABS tablet Take 1 tablet by mouth at bedtime.   . Multiple Vitamins-Minerals (PRESERVISION AREDS 2) CAPS Take 1 capsule by mouth  2 (two) times daily.   . polyvinyl alcohol (ARTIFICIAL TEARS) 1.4 % ophthalmic solution Place 1 drop into both eyes daily as needed for dry eyes.  . potassium chloride SA (K-DUR,KLOR-CON) 20 MEQ tablet Take 20 mEq by mouth 4 (four) times daily.   [name] PRESCRIPTION MEDICATION Inhale into the lungs at bedtime. CPAP  . sertraline (ZOLOFT) 100 MG tablet TAKE 1 TABLET BY MOUTH  DAILY  . torsemide (DEMADEX) 100 MG tablet Take 100 mg in AM and 50 mg in PM if needed for weight gain (Patient taking differently: Take 50-100 mg by mouth See admin instructions. TAKE 1 TABLET (100 MG) BY MOUTH SCHEDULED IN THE MORNING, AND MAY TAKE 0.5 TABLET (50 MG) IN THE EVENING IF NEEDED FOR SWELLING/WEIGHT GAIN)  . warfarin (COUMADIN) 5 MG tablet Take 1 to 2 tablets daily as directed   No current facility-administered medications on file prior to visit.     ROS: Review of Systems  Constitutional: Negative for malaise/fatigue and weight loss.  HENT: Negative for hearing loss and tinnitus.   Eyes: Negative for blurred vision and double vision.  Respiratory: Negative for cough, shortness of breath and wheezing.   Cardiovascular: Negative for chest pain, palpitations, orthopnea, claudication and leg swelling.  Gastrointestinal: Negative for abdominal pain, blood in stool, constipation, diarrhea, heartburn, melena, nausea and vomiting.  Genitourinary: Negative.   Musculoskeletal: Negative for joint pain and myalgias.  Skin: Negative for rash.  Neurological: Negative for dizziness, tingling, sensory change, weakness and headaches.  Endo/Heme/Allergies: Negative for polydipsia. Bruises/bleeds easily (nose bleeds).  Psychiatric/Behavioral: Negative.   All other systems reviewed and are negative.    Physical Exam:  BP 106/68   Pulse 86   Temp (!) 97.5 F (36.4 C)   Ht 5' 11" (1.803 m)   Wt  194 lb (88 kg)   SpO2 93%   BMI 27.06 kg/m   General Appearance: Well nourished, in no apparent distress. Eyes: PERRLA, EOMs, conjunctiva no swelling or erythema Sinuses: No Frontal/maxillary tenderness ENT/Mouth: Ext aud canals clear, TMs without erythema, bulging. No erythema, swelling, or exudate on post pharynx.  Tonsils not swollen or erythematous. Hearing normal. Nasal mucus membranes intact, no polypes of blood in nares/turbinates Neck: Supple, thyroid normal.  Respiratory: Respiratory effort normal, BS equal bilaterally without rales, rhonchi, wheezing or stridor.  Cardio: RRR with no MRGs. Brisk peripheral pulses without edema.  Abdomen: Soft, + BS.  Non tender, no guarding, rebound, hernias, masses. Lymphatics: Non tender without lymphadenopathy.  Musculoskeletal: Full ROM, 5/5 strength, normal gait.  Skin: Warm, dry without rashes, lesions, ecchymosis.  Neuro: Cranial nerves intact. Normal muscle tone, no cerebellar symptoms. Sensation intact.  Psych: Awake and oriented X 3, normal affect, Insight and Judgment appropriate.     Izora Ribas, NP 3:34 PM [facility]

## 2017-12-13 ENCOUNTER — Ambulatory Visit (INDEPENDENT_AMBULATORY_CARE_PROVIDER_SITE_OTHER): Payer: Medicare Other | Admitting: Adult Health

## 2017-12-13 ENCOUNTER — Encounter: Payer: Self-pay | Admitting: Adult Health

## 2017-12-13 VITALS — BP 106/68 | HR 86 | Temp 97.5°F | Ht 71.0 in | Wt 194.0 lb

## 2017-12-13 DIAGNOSIS — I5032 Chronic diastolic (congestive) heart failure: Secondary | ICD-10-CM | POA: Diagnosis not present

## 2017-12-13 DIAGNOSIS — I10 Essential (primary) hypertension: Secondary | ICD-10-CM | POA: Diagnosis not present

## 2017-12-13 DIAGNOSIS — I480 Paroxysmal atrial fibrillation: Secondary | ICD-10-CM | POA: Diagnosis not present

## 2017-12-13 DIAGNOSIS — R252 Cramp and spasm: Secondary | ICD-10-CM

## 2017-12-13 DIAGNOSIS — E1122 Type 2 diabetes mellitus with diabetic chronic kidney disease: Secondary | ICD-10-CM

## 2017-12-13 DIAGNOSIS — Z794 Long term (current) use of insulin: Secondary | ICD-10-CM

## 2017-12-13 DIAGNOSIS — Z79899 Other long term (current) drug therapy: Secondary | ICD-10-CM | POA: Diagnosis not present

## 2017-12-13 DIAGNOSIS — N184 Chronic kidney disease, stage 4 (severe): Secondary | ICD-10-CM | POA: Diagnosis not present

## 2017-12-13 NOTE — Patient Instructions (Signed)
Leg Cramps Leg cramps occur when a muscle or muscles tighten and you have no control over this tightening (involuntary muscle contraction). Muscle cramps can develop in any muscle, but the most common place is in the calf muscles of the leg. Those cramps can occur during exercise or when you are at rest. Leg cramps are painful, and they may last for a few seconds to a few minutes. Cramps may return several times before they finally stop. Usually, leg cramps are not caused by a serious medical problem. In many cases, the cause is not known. Some common causes include:  Overexertion.  Overuse from repetitive motions, or doing the same thing over and over.  Remaining in a certain position for a long period of time.  Improper preparation, form, or technique while performing a sport or an activity.  Dehydration.  Injury.  Side effects of some medicines.  Abnormally low levels of the salts and ions in your blood (electrolytes), especially potassium and calcium. These levels could be low if you are taking water pills (diuretics) or if you are pregnant.  Follow these instructions at home: Watch your condition for any changes. Taking the following actions may help to lessen any discomfort that you are feeling:  Stay well-hydrated. Drink enough fluid to keep your urine clear or pale yellow.  Try massaging, stretching, and relaxing the affected muscle. Do this for several minutes at a time.  For tight or tense muscles, use a warm towel, heating pad, or hot shower water directed to the affected area.  If you are sore or have pain after a cramp, applying ice to the affected area may relieve discomfort. ? Put ice in a plastic bag. ? Place a towel between your skin and the bag. ? Leave the ice on for 20 minutes, 2-3 times per day.  Avoid strenuous exercise for several days if you have been having frequent leg cramps.  Make sure that your diet includes the essential minerals for your muscles to  work normally.  Take medicines only as directed by your health care provider.  Contact a health care provider if:  Your leg cramps get more severe or more frequent, or they do not improve over time.  Your foot becomes cold, numb, or blue. This information is not intended to replace advice given to you by your health care provider. Make sure you discuss any questions you have with your health care provider. Document Released: 01/30/2004 Document Revised: 05/30/2015 Document Reviewed: 11/29/2013 Else[employer] Interactive Patient Education  2018 [place], [place] A nosebleed is when blood comes out of the nose. Nosebleeds are common. Usually, they are not a sign of a serious condition. Nosebleeds can happen if a small blood vessel in your nose starts to bleed or if the lining of your nose (mucous membrane) cracks. They are commonly caused by:  Allergies.  Colds.  Picking your nose.  Blowing your nose too hard.  An injury from sticking an object into your nose or getting hit in the nose.  Dry or cold air.  Less common causes of nosebleeds include:  Toxic fumes.  Something abnormal in the nose or in the air-filled spaces in the bones of the face (sinuses).  Growths in the nose, such as polyps.  Medicines or conditions that cause blood to clot slowly.  Certain illnesses or procedures that irritate or dry out the nasal passages.  Follow these instructions at home: When you have a nosebleed:  Sit down  and tilt your head slightly forward.  Use a clean towel or tissue to pinch your nostrils under the bony part of your nose. After 10 minutes, let go of your nose and see if bleeding starts again. Do not release pressure before that time. If there is still bleeding, repeat the pinching and holding for 10 minutes until the bleeding stops.  Do not place tissues or gauze in the nose to stop bleeding.  Avoid lying down and avoid tilting your head backward. That may  make blood collect in the throat and cause gagging or coughing.  Use a nasal spray decongestant to help with a nosebleed as told by your health care provider.  Do not use petroleum jelly or mineral oil in your nose. It can drip into your lungs. After a nosebleed:  Avoid blowing your nose or sniffing for a number of hours.  Avoid straining, lifting, or bending at the waist for several days. You may resume other normal activities as you are able.  Use saline spray or a humidifier as told by your health care provider.  Aspirinand blood thinners make bleeding more likely. If you are prescribed these medicines and you suffer from nosebleeds: ? Ask your health care provider if you should stop taking the medicines or if you should adjust the dose. ? Do not stop taking medicines that your health care provider has recommended unless told by your health care provider.  If your nosebleed was caused by dry mucous membranes, use over-the-counter saline nasal spray or gel. This will keep the mucous membranes moist and allow them to heal. If you must use a lubricant: ? Choose one that is water-soluble. ? Use only as much as you need and use it only as often as needed. ? Do not lie down until several hours after you use it. Contact a health care provider if:  You have a fever.  You get nosebleeds often or more often than usual.  You bruise very easily.  You have a nosebleed from having something stuck in your nose.  You have bleeding in your mouth.  You vomit or cough up brown material.  You have a nosebleed after you start a new medicine. Get help right away if:  You have a nosebleed after a fall or a head injury.  Your nosebleed does not go away after 20 minutes.  You feel dizzy or weak.  You have unusual bleeding from other parts of your body.  You have unusual bruising on other parts of your body.  You become sweaty.  You vomit blood. This information is not intended to  replace advice given to you by your health care provider. Make sure you discuss any questions you have with your health care provider. Document Released: 10/01/2004 Document Revised: 08/22/2015 Document Reviewed: 07/09/2015 [employer] Interactive Patient Education  [employer].

## 2017-12-14 ENCOUNTER — Other Ambulatory Visit: Payer: Self-pay | Admitting: *Deleted

## 2017-12-14 DIAGNOSIS — Q211 Atrial septal defect: Secondary | ICD-10-CM

## 2017-12-14 DIAGNOSIS — I480 Paroxysmal atrial fibrillation: Secondary | ICD-10-CM

## 2017-12-14 DIAGNOSIS — Q2112 Patent foramen ovale: Secondary | ICD-10-CM

## 2017-12-14 LAB — CBC WITH DIFFERENTIAL/PLATELET
Basophils Absolute: 47 cells/uL (ref 0–200)
Basophils Relative: 0.4 %
Eosinophils Absolute: 176 cells/uL (ref 15–500)
Eosinophils Relative: 1.5 %
HEMATOCRIT: 41.7 % (ref 38.5–50.0)
HEMOGLOBIN: 14.2 g/dL (ref 13.2–17.1)
Lymphs Abs: 1030 cells/uL (ref 850–3900)
MCH: 32.3 pg (ref 27.0–33.0)
MCHC: 34.1 g/dL (ref 32.0–36.0)
MCV: 94.8 fL (ref 80.0–100.0)
MPV: 9 fL (ref 7.5–12.5)
Monocytes Relative: 7.4 %
Neutro Abs: 9582 cells/uL — ABNORMAL HIGH (ref 1500–7800)
Neutrophils Relative %: 81.9 %
Platelets: 223 10*3/uL (ref 140–400)
RBC: 4.4 10*6/uL (ref 4.20–5.80)
RDW: 13.5 % (ref 11.0–15.0)
Total Lymphocyte: 8.8 %
WBC mixed population: 866 cells/uL (ref 200–950)
WBC: 11.7 10*3/uL — ABNORMAL HIGH (ref 3.8–10.8)

## 2017-12-14 LAB — PROTIME-INR
INR: 2.7 — ABNORMAL HIGH
PROTHROMBIN TIME: 26.6 s — AB (ref 9.0–11.5)

## 2017-12-14 LAB — BASIC METABOLIC PANEL WITH GFR
BUN/Creatinine Ratio: 23 (calc) — ABNORMAL HIGH (ref 6–22)
BUN: 46 mg/dL — ABNORMAL HIGH (ref 7–25)
CO2: 34 mmol/L — ABNORMAL HIGH (ref 20–32)
Calcium: 9.4 mg/dL (ref 8.6–10.3)
Chloride: 91 mmol/L — ABNORMAL LOW (ref 98–110)
Creat: 2.01 mg/dL — ABNORMAL HIGH (ref 0.70–1.18)
GFR, EST AFRICAN AMERICAN: 36 mL/min/{1.73_m2} — AB (ref >=60)
GFR, EST NON AFRICAN AMERICAN: 31 mL/min/{1.73_m2} — AB (ref >=60)
Glucose, Bld: 235 mg/dL — ABNORMAL HIGH (ref 65–99)
Potassium: 3.4 mmol/L — ABNORMAL LOW (ref 3.5–5.3)
Sodium: 137 mmol/L (ref 135–146)

## 2017-12-14 LAB — MAGNESIUM: Magnesium: 2.2 mg/dL (ref 1.5–2.5)

## 2017-12-14 MED ORDER — WARFARIN SODIUM 5 MG PO TABS: ORAL_TABLET | ORAL | 3 refills | Status: DC

## 2018-01-06 DIAGNOSIS — M109 Gout, unspecified: Secondary | ICD-10-CM | POA: Diagnosis not present

## 2018-01-06 DIAGNOSIS — M79643 Pain in unspecified hand: Secondary | ICD-10-CM | POA: Diagnosis not present

## 2018-01-06 DIAGNOSIS — E790 Hyperuricemia without signs of inflammatory arthritis and tophaceous disease: Secondary | ICD-10-CM | POA: Diagnosis not present

## 2018-01-06 DIAGNOSIS — N189 Chronic kidney disease, unspecified: Secondary | ICD-10-CM | POA: Diagnosis not present

## 2018-01-06 DIAGNOSIS — M1990 Unspecified osteoarthritis, unspecified site: Secondary | ICD-10-CM | POA: Diagnosis not present

## 2018-01-14 NOTE — Progress Notes (Signed)
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 Call if greater than 130/80.  -     CMP/GFR  Coronary artery disease involving native coronary artery of native heart without angina pectoris Control blood pressure, cholesterol, glucose, increase exercise.   PFO (patent foramen ovale) Monitoring with cardio  Pulmonary hypertension (HCC) Monitored by cardio and pulmonary, doing much better  Atrial flutter, unspecified type ([place]) Continue follow up cardio, continue coumadin  Chronic respiratory failure with hypoxia (HCC) Monitored by pulmonary  OSA on CPAP Continue CPAP  Chronic restrictive lung disease Monitored by pulmonary  Diverticulosis of large intestine without hemorrhage  No tenderness, continue fiber  GERD Continue PPI/H2 blocker, diet discussed  Controlled type 2 diabetes mellitus with stage 3 chronic kidney disease, without long-term current use of insulin ([place]) Discussed general issues about diabetes pathophysiology and management., Educational material distributed., Suggested low cholesterol diet., Encouraged aerobic exercise., Discussed foot care., Reminded to get yearly retinal exam - report from most recent verified  Osteoarthritis, unspecified  osteoarthritis type, unspecified site RICE, NSAIDS, exercises given, if not better get xray and PT referral or ortho referral.   CKD (chronic kidney disease) stage 3, GFR 30-59 ml/min  avoid NSAIDS, monitor sugars, will monitor -     BASIC METABOLIC PANEL WITH GFR  Hyperlipidemia -continue medications, check lipids, decrease fatty foods, increase activity.   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   Gout, unspecified cause, unspecified chronicity, unspecified site Monitor  Iron deficiency/blood loss anemia - check CBC   Over 30 minutes of exam, counseling, chart review, and critical decision making was performed  Future Appointments  Date Time Provider [place]  02/10/2018  8:00 AM Hayden Pedro, MD [facility] None  02/22/2018  2:30 PM [name], MD [facility] None  05/18/2018  2:00 PM [name], MD [facility] None  09/19/2018  3:00 PM Liane Comber, NP [facility] None     Plan:   During the course of the visit the patient was educated and counseled about appropriate screening and preventive services including:    Pneumococcal vaccine   Influenza vaccine  Prevnar 13  Td vaccine  Screening electrocardiogram  Colorectal cancer screening  Diabetes screening  Glaucoma screening  Nutrition counseling    Subjective:  [name]. is a 78 y.o. male who presents for  Medicare Annual Wellness Visit and 1 month follow up for HTN, CKD, weight and pfib  coumadin management.   He has a complicated heart history, had ASCAD and pfib/flutter in Apr 2015 with subsequent CABG with MAZE. 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. Weight is stable.    He is on xanax as needed for sleep and zoloft for depression/anxiety which is in remission.   BMI is Body mass index is 27.34 kg/m., he has been working on diet and exercise. Wt Readings from Last 3 Encounters:  01/17/18 196 lb (88.9 kg)  12/13/17 194 lb (88 kg)  11/22/17 195 lb 9.6 oz (88.7 kg)   His blood pressure has been controlled at home, today their BP is BP: 128/70 He does workout, walks and works out in the yard. He denies chest pain, shortness of breath, dizziness.   He is still on coumadin at this time, denies any bleeding, black stools, etc. He is on 10 mg daily except Sunday taking 7.5 mg Lab Results  Component Value Date   INR 2.7 (H) 12/13/2017   INR 2.4 (H) 11/08/2017   INR 2.8 (H) 09/13/2017   Last GFR Lab Results  Component Value Date   GFRNONAA 31 (L) 12/13/2017  Just had BMP checked last week at nephrology, reports unchanged from previous. He states he does not want dialysis if it ever comes from it.   He has been working on diet and exercise for Diabetes with diabetic chronic kidney disease and with diabetic polyneuropathy, he is not on bASA, he is on ACE/ARB, he is on 50 in the morning and 40 in the evening, has had low sugars normally middle of the day, he does have hypoglycemic awareness, and denies polydipsia, polyuria and visual disturbances. Last A1C was:  Lab Results  Component Value Date   HGBA1C 8.8 (H) 11/08/2017     Medication Review: Current Outpatient Medications on File Prior to Visit  Medication Sig  . ACCU-CHEK AVIVA PLUS test strip CHECK BLOOD GLUCOSE  3 TIMES DAILY.  [name] acetaminophen (TYLENOL) 500 MG tablet Take 1,000 mg by mouth every 6 (six) hours as needed for moderate pain or headache.   . ALPRAZolam (XANAX) 1 MG tablet Take 0.5 tablets (0.5 mg total) by mouth at bedtime as needed for sleep.  [name] Alum Hydroxide-Mag Carbonate (GAVISCON PO) Take 4 tablets by mouth daily as needed (acid reflux).   [name] aspirin EC 81 MG tablet Take 81 mg by mouth daily.  [name] atorvastatin (LIPITOR) 40 MG tablet Take 20 mg by mouth every Monday, Wednesday, and Friday.   [name] azelastine (ASTELIN) 0.1 % nasal spray PLACE 2 SPRAYS INTO BOTH NOSTRILS 2 (TWO) TIMES DAILY. USE IN EACH NOSTRIL AS DIRECTED  . B Complex Vitamins (VITAMIN B COMPLEX PO) Take 1 tablet by mouth at bedtime.   . bacitracin 500 UNIT/GM ointment Apply 1 application topically daily as needed for wound care.  . benzonatate (TESSALON) 200 MG capsule Take 1 capsule (200 mg total) by mouth 3 (three) times daily as needed for cough (Max: '600mg'$  per day).  . Blood Glucose Monitoring Suppl (ACCU-CHEK AVIVA PLUS) w/Device KIT Check blood sugar 1 time  daily  . Blood Glucose Monitoring Suppl DEVI Test blood sugar up to three times a day or as directed.  . carvedilol (COREG) 6.25 MG tablet Take 1 tablet (6.25 mg total) by mouth 3 (three) times daily.  . Cholecalciferol (VITAMIN D PO) Take 5,000 Units by mouth 2 (two)  times daily.  . colchicine 0.6 MG tablet Take 0.6 mg by mouth daily as needed (for gout flare up).   . diphenhydrAMINE (BENADRYL) 25 MG tablet Take 25 mg by mouth at bedtime as needed for allergies.   . ferrous sulfate 325 (65 FE) MG tablet Take 325 mg by mouth 2 (two) times daily with a meal.  . fluticasone (FLONASE) 50 MCG/ACT nasal spray Place 1 spray into both nostrils daily as needed for allergies.  [name] HYDROcodone-acetaminophen (NORCO/VICODIN) 5-325 MG tablet Take 1-2 tablets by mouth every 6 (six) hours as needed for moderate pain.   [name] insulin NPH-regular Human (NOVOLIN 70/30) (70-30) 100 UNIT/ML  injection Inject 25 Units into the skin daily. 15 units AM, 10 units PM  . lidocaine (LIDODERM) 5 % Place 1 patch onto the skin daily as needed (for pain). Remove & Discard patch within 12 hours or as directed by MD   . loratadine (CLARITIN) 10 MG tablet Take 10 mg by mouth daily as needed for allergies.   . metolazone (ZAROXOLYN) 5 MG tablet TAKE 1 TABLET BY MOUTH  DAILY AS NEEDED (WEIGHT  GAIN AND EDEMA)  . Multiple Vitamin (MULTIVITAMIN WITH MINERALS) TABS tablet Take 1 tablet by mouth at bedtime.   . Multiple Vitamins-Minerals (PRESERVISION AREDS 2) CAPS Take 1 capsule by mouth 2 (two) times daily.   . polyvinyl alcohol (ARTIFICIAL TEARS) 1.4 % ophthalmic solution Place 1 drop into both eyes daily as needed for dry eyes.  . potassium chloride SA (K-DUR,KLOR-CON) 20 MEQ tablet Take 20 mEq by mouth 4 (four) times daily.   [name] PRESCRIPTION MEDICATION Inhale into the lungs at bedtime. CPAP  . sertraline (ZOLOFT) 100 MG tablet TAKE 1 TABLET BY MOUTH  DAILY  . torsemide (DEMADEX) 100 MG tablet Take 100 mg in AM and 50 mg in PM if needed for weight gain (Patient taking differently: Take 50-100 mg by mouth See admin instructions. TAKE 1 TABLET (100 MG) BY MOUTH SCHEDULED IN THE MORNING, AND MAY TAKE 0.5 TABLET (50 MG) IN THE EVENING IF NEEDED FOR SWELLING/WEIGHT GAIN)  . warfarin (COUMADIN) 5 MG tablet Take 1 to 2 tablets daily as directed  . allopurinol (ZYLOPRIM) 300 MG tablet Take 1 tablet daily to prevent gout (Patient taking differently: Take 300 mg by mouth daily. )   No current facility-administered medications on file prior to visit.     Allergies: Allergies  Allergen Reactions  . Sunflower Seed [Sunflower Oil] Swelling and Other (See Comments)    Tongue and lip swelling  . Horse-Derived Products Other (See Comments)    Per allergy skin test UNSPECIFIED REACTION   . Tetanus Toxoids Other (See Comments)    Per allergy skin test  . 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; Type 2 diabetes mellitus with stage 4 chronic kidney disease, with long-term current use of insulin ([place]); GERD; DJD (degenerative joint disease); Pleural effusion on left; Vitamin D deficiency; Encounter for monitoring Coumadin therapy; Chronic anticoagulation; Essential hypertension; CAD (coronary artery disease); PFO (patent foramen ovale); 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[place]); OSA on CPAP; S/P Maze operation for atrial fibrillation; Pulmonary hypertension ([place]); 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  Screening Tests Immunization History  Administered Date(s) Administered  . DT 08/05/2015  . Influenza Split 10/25/2012  . Influenza, High Dose Seasonal PF 09/26/2013, 09/27/2014, 10/03/2015, 10/05/2016, 11/11/2017  . Pneumococcal Conjugate-13 01/30/2014  . Pneumococcal Polysaccharide-23 10/20/2011  . Td 01/06/2000   Preventative care: Last colonoscopy: 01/26/2017 3 year recall?  EGD: 01/26/2017 PFT 07/2014 Ct AB 08/2014 US renal 08/2017  Prior vaccinations: TD or Tdap: 2017 Influenza: 2019 Pneumococcal: 2013 Prevnar13: 2016 Shingles/Zostavax: undecided  Names of Other Physician/Practitioners you currently use: 1. [facility] here for primary care 2. Dr. Zigmund Daniel, eye doctor, last visit 02/04/2017 - report received and abstracted 3. Dr. Johnn Hai, dentist, last visit 2019, goes q92m Patient Care Team: MUnk Pinto MD as PCP - General (Internal Medicine) KInda Castle MD (Inactive) as Consulting Physician (Gastroenterology) JMartinique Peter M, MD as Consulting Physician (Cardiology) ORexene Alberts MD as Consulting Physician (Cardiothoracic Surgery) Szott, MPrescott Gum DDS as Referring Physician MHayden Pedro MD as Consulting Physician (Ophthalmology) TLavonna Monarch MD as Consulting Physician (Dermatology) NJosue Hector MD as Consulting Physician (Cardiology) AThompson Grayer MD as Consulting Physician (Cardiology) Pleasant, FEppie Gibson RN as TNectarManagement  Surgical: He  has a past surgical history that includes Polinydal cyst; Great toe arthrodesis, interphalangeal joint; Retina repair-right; Cataract extraction; Excision basal cell carcinoma; Polypectomy; Cardiac catheterization; Coronary artery bypass graft (N/A, 04/05/2013); MAZE (N/A, 04/05/2013); Intraoprative transesophageal echocardiogram (N/A, 04/05/2013); TEE without cardioversion (N/A, 08/23/2013); Cardioversion (N/A, 08/23/2013); TEE without cardioversion (N/A, 10/26/2013); left heart catheterization with coronary angiogram (N/A, 03/07/2013); atrial fibrillation ablation (N/A, 10/26/2013); Carpometacarpel ([facility] suspension plasty (Left, 02/14/2014); Trapezium resection; right heart catheterization (N/A, 05/03/2014); TEE without cardioversion (N/A, 06/05/2014); Esophagogastroduodenoscopy (egd) with propofol (N/A, 01/26/2017); Colonoscopy with propofol (N/A, 01/26/2017); Appendectomy (03/05/2017); and laparoscopic appendectomy (N/A, 03/05/2017). Family His family history includes Atrial fibrillation in his mother; Colon cancer in his mother; Colon polyps in his mother and sister; Dementia in his father; Diabetes in his maternal uncle; Hypertension in his mother; Stroke in his paternal uncle. Social history  He reports that he quit smoking about 37 years ago. His smoking use included cigarettes. He has a 100.00 pack-year  smoking history. He has never used smokeless tobacco. He reports current alcohol use of about 1.0 standard drinks of alcohol per week. He reports that he does not use drugs.  MEDICARE WELLNESS OBJECTIVES: Physical activity: Current Exercise Habits: The patient does not participate in regular exercise at present(wants to start doing water walking) Cardiac risk factors: Cardiac Risk Factors include: advanced age (>552m, >6>35omen);diabetes mellitus;male gender;dyslipidemia;hypertension;sedentary lifestyle Depression/mood screen:   [facility] 01/17/2018  Decreased Interest 0  Down, Depressed, Hopeless 0  PHQ - 2 Score 0  Some recent data might be hidden    ADLs:  In your present state of health, do you have any difficulty performing the following activities: 01/17/2018 11/14/2017  Hearing? N N  Vision? N N  Difficulty concentrating or making decisions? N N  Walking or climbing stairs? N N  Dressing or bathing? N N  Doing errands, shopping? N N  Some recent data might be hidden     Cognitive Testing  Alert? Yes  Normal Appearance?Yes  Oriented to person? Yes  Place? Yes   Time? Yes  Recall of three objects?  Yes  Can perform simple calculations? Yes  Displays appropriate judgment?Yes  Can read the correct time from a watch face?Yes  EOL planning: Does Patient Have a Medical Advance Directive?: Yes Type of Advance Directive: Healthcare Power of Attorney, Living will Does patient want to make changes to medical advance directive?: No - Patient declined  yes  Review of Systems  Constitutional: Negative for chills, fever, malaise/fatigue and weight loss.  HENT: Negative.  Negative for hearing loss and tinnitus.   Eyes: Negative for blurred vision and double vision.  Respiratory: Negative for cough, sputum production, shortness of breath and wheezing.   Cardiovascular: Negative for chest pain, palpitations, orthopnea, claudication, leg swelling (improved) and PND.   Gastrointestinal: Negative.  Negative for abdominal pain, blood in stool, constipation, diarrhea, heartburn, melena, nausea and vomiting.  Genitourinary: Negative.  Negative for dysuria, flank pain, frequency, hematuria and urgency.  Musculoskeletal: Negative.  Negative for falls, joint pain and myalgias.  Skin: Negative.  Negative for rash.  Neurological: Negative for dizziness, tingling, tremors, sensory change, speech change, focal weakness, seizures, loss of consciousness, weakness and headaches.  Endo/Heme/Allergies: Negative for polydipsia.  Psychiatric/Behavioral: Negative.  Negative for depression, memory loss, substance abuse and suicidal ideas. The patient is not nervous/anxious and does not have insomnia.   All other systems reviewed and are negative.    Objective:   Today's Vitals   01/17/18 1045  BP: 128/70  Pulse: 74  Temp: 97.7 F (36.5 C)  SpO2: 97%  Weight: 196 lb (88.9 kg)  Height: _0  (1.803 m)   Body mass index is 27.34 kg/m.  General appearance: alert, no distress, WD/WN, male HEENT: right lower subconjunctival hemorrhage, normocephalic, sclerae anicteric, TMs pearly, nares patent, no discharge or erythema, pharynx normal Oral cavity: MMM, no lesions Neck: supple, no lymphadenopathy, no thyromegaly, no masses Heart: RRR, normal S1, S2, no murmurs Lungs: CTA bilaterally, no wheezes, rhonchi, or rales Abdomen: +bs, soft, non tender, non distended, no masses, no hepatomegaly, no splenomegaly Musculoskeletal: nontender, no swelling, no obvious deformity Extremities: no edema, no cyanosis, no clubbing Pulses: 2+ symmetric, upper and lower extremities, normal cap refill Neurological: alert, oriented x 3, CN2-12 intact, strength normal upper extremities and lower extremities, sensation normal throughout, DTRs 2+ throughout, no cerebellar signs, gait normal Skin scaly patches on head- need freezing Psychiatric: normal affect, behavior normal, pleasant    Medicare Attestation I have personally reviewed: The patient's medical and social history Their use of alcohol, tobacco or illicit drugs Their current medications and supplements The patient's functional ability including ADLs,fall risks, home safety risks, cognitive, and hearing and visual impairment Diet and physical activities Evidence for depression or mood disorders  The patient's weight, height, BMI, and visual acuity have been recorded in the chart.  I have made referrals, counseling, and provided education to the patient based on review of the above and I have provided the patient with a written personalized care plan for preventive services.     Vicie Mutters, PA-C   01/17/2018

## 2018-01-17 ENCOUNTER — Ambulatory Visit (INDEPENDENT_AMBULATORY_CARE_PROVIDER_SITE_OTHER): Payer: Medicare Other | Admitting: Physician Assistant

## 2018-01-17 ENCOUNTER — Encounter: Payer: Self-pay | Admitting: Physician Assistant

## 2018-01-17 VITALS — BP 128/70 | HR 74 | Temp 97.7°F | Ht 71.0 in | Wt 196.0 lb

## 2018-01-17 DIAGNOSIS — R6889 Other general symptoms and signs: Secondary | ICD-10-CM

## 2018-01-17 DIAGNOSIS — I10 Essential (primary) hypertension: Secondary | ICD-10-CM

## 2018-01-17 DIAGNOSIS — E782 Mixed hyperlipidemia: Secondary | ICD-10-CM

## 2018-01-17 DIAGNOSIS — Q262 Total anomalous pulmonary venous connection: Secondary | ICD-10-CM

## 2018-01-17 DIAGNOSIS — G4733 Obstructive sleep apnea (adult) (pediatric): Secondary | ICD-10-CM

## 2018-01-17 DIAGNOSIS — Z0001 Encounter for general adult medical examination with abnormal findings: Secondary | ICD-10-CM | POA: Diagnosis not present

## 2018-01-17 DIAGNOSIS — Q211 Atrial septal defect: Secondary | ICD-10-CM

## 2018-01-17 DIAGNOSIS — J984 Other disorders of lung: Secondary | ICD-10-CM

## 2018-01-17 DIAGNOSIS — Z5181 Encounter for therapeutic drug level monitoring: Secondary | ICD-10-CM

## 2018-01-17 DIAGNOSIS — Z7901 Long term (current) use of anticoagulants: Secondary | ICD-10-CM

## 2018-01-17 DIAGNOSIS — D692 Other nonthrombocytopenic purpura: Secondary | ICD-10-CM

## 2018-01-17 DIAGNOSIS — Z0000 Encounter for general adult medical examination without abnormal findings: Secondary | ICD-10-CM

## 2018-01-17 DIAGNOSIS — Z86010 Personal history of colonic polyps: Secondary | ICD-10-CM

## 2018-01-17 DIAGNOSIS — N183 Chronic kidney disease, stage 3 (moderate): Secondary | ICD-10-CM | POA: Diagnosis not present

## 2018-01-17 DIAGNOSIS — J90 Pleural effusion, not elsewhere classified: Secondary | ICD-10-CM

## 2018-01-17 DIAGNOSIS — I110 Hypertensive heart disease with heart failure: Secondary | ICD-10-CM

## 2018-01-17 DIAGNOSIS — I5032 Chronic diastolic (congestive) heart failure: Secondary | ICD-10-CM

## 2018-01-17 DIAGNOSIS — E1122 Type 2 diabetes mellitus with diabetic chronic kidney disease: Secondary | ICD-10-CM | POA: Diagnosis not present

## 2018-01-17 DIAGNOSIS — F3342 Major depressive disorder, recurrent, in full remission: Secondary | ICD-10-CM

## 2018-01-17 DIAGNOSIS — Z794 Long term (current) use of insulin: Secondary | ICD-10-CM

## 2018-01-17 DIAGNOSIS — Z951 Presence of aortocoronary bypass graft: Secondary | ICD-10-CM

## 2018-01-17 DIAGNOSIS — K2100 Gastro-esophageal reflux disease with esophagitis, without bleeding: Secondary | ICD-10-CM

## 2018-01-17 DIAGNOSIS — I4892 Unspecified atrial flutter: Secondary | ICD-10-CM | POA: Diagnosis not present

## 2018-01-17 DIAGNOSIS — I43 Cardiomyopathy in diseases classified elsewhere: Secondary | ICD-10-CM

## 2018-01-17 DIAGNOSIS — Z9889 Other specified postprocedural states: Secondary | ICD-10-CM

## 2018-01-17 DIAGNOSIS — N184 Chronic kidney disease, stage 4 (severe): Secondary | ICD-10-CM

## 2018-01-17 DIAGNOSIS — Z8679 Personal history of other diseases of the circulatory system: Secondary | ICD-10-CM

## 2018-01-17 DIAGNOSIS — K5730 Diverticulosis of large intestine without perforation or abscess without bleeding: Secondary | ICD-10-CM

## 2018-01-17 DIAGNOSIS — M1990 Unspecified osteoarthritis, unspecified site: Secondary | ICD-10-CM

## 2018-01-17 DIAGNOSIS — I480 Paroxysmal atrial fibrillation: Secondary | ICD-10-CM | POA: Diagnosis not present

## 2018-01-17 DIAGNOSIS — Q2112 Patent foramen ovale: Secondary | ICD-10-CM

## 2018-01-17 DIAGNOSIS — E559 Vitamin D deficiency, unspecified: Secondary | ICD-10-CM

## 2018-01-17 DIAGNOSIS — I2510 Atherosclerotic heart disease of native coronary artery without angina pectoris: Secondary | ICD-10-CM

## 2018-01-17 DIAGNOSIS — Z9989 Dependence on other enabling machines and devices: Secondary | ICD-10-CM

## 2018-01-17 DIAGNOSIS — M1A9XX0 Chronic gout, unspecified, without tophus (tophi): Secondary | ICD-10-CM

## 2018-01-17 DIAGNOSIS — I272 Pulmonary hypertension, unspecified: Secondary | ICD-10-CM | POA: Diagnosis not present

## 2018-01-17 DIAGNOSIS — D509 Iron deficiency anemia, unspecified: Secondary | ICD-10-CM

## 2018-01-17 NOTE — Patient Instructions (Addendum)
When it comes to diets, agreement about the perfect plan isn't easy to find, even among the experts. Experts at the [place] developed an idea known as the Healthy Eating Plate. Just imagine a plate divided into logical, healthy portions.  The emphasis is on diet quality:  Load up on vegetables and fruits - one-half of your plate: Aim for color and variety, and remember that potatoes don't count.  Go for whole grains - one-quarter of your plate: Whole wheat, barley, wheat berries, quinoa, oats, brown rice, and foods made with them. If you want pasta, go with whole wheat pasta.  Protein power - one-quarter of your plate: Fish, chicken, beans, and nuts are all healthy, versatile protein sources. Limit red meat.  The diet, however, does go beyond the plate, offering a few other suggestions.  Use healthy plant oils, such as olive, canola, soy, corn, sunflower and peanut. Check the labels, and avoid partially hydrogenated oil, which have unhealthy trans fats.  If you're thirsty, drink water. Coffee and tea are good in moderation, but skip sugary drinks and limit milk and dairy products to one or two daily servings.  The type of carbohydrate in the diet is more important than the amount. Some sources of carbohydrates, such as vegetables, fruits, whole grains, and beans-are healthier than others.  Finally, stay active.   Subconjunctival Hemorrhage  Subconjunctival hemorrhage is bleeding that happens between the white part of your eye (sclera) and the clear membrane that covers the outside of your eye (conjunctiva). There are many tiny blood vessels near the surface of your eye. A subconjunctival hemorrhage happens when one or more of these vessels breaks and bleeds, causing a red patch to appear on your eye. This is similar to a bruise. Depending on the amount of bleeding, the red patch may only cover a small area of your eye or it may cover the entire visible part of  the sclera. If a lot of blood collects under the conjunctiva, there may also be swelling. Subconjunctival hemorrhages do not affect your vision or cause pain, but your eye may feel irritated if there is swelling. Subconjunctival hemorrhages usually do not require treatment, and they disappear on their own within two weeks. What are the causes? This condition may be caused by:  Mild trauma, such as rubbing your eye too hard.  Severe trauma or blunt injuries.  Coughing, sneezing, or vomiting.  Straining, such as when lifting a heavy object.  High blood pressure.  Recent eye surgery.  A history of diabetes.  Certain medicines, especially blood thinners (anticoagulants).  Other conditions, such as eye tumors, bleeding disorders, or blood vessel abnormalities. Subconjunctival hemorrhages can happen without an obvious cause. What are the signs or symptoms? Symptoms of this condition include:  A bright red or dark red patch on the white part of the eye. ? The red area may spread out to cover a larger area of the eye before it goes away. ? The red area may turn brownish-yellow before it goes away.  Swelling.  Mild eye irritation. How is this diagnosed? This condition is diagnosed with a physical exam. If your subconjunctival hemorrhage was caused by trauma, your health care provider may refer you to an eye specialist (ophthalmologist) or another specialist to check for other injuries. You may have other tests, including:  An eye exam.  A blood pressure check.  Blood tests to check for bleeding disorders. If your subconjunctival hemorrhage was caused by trauma,  X-rays or a CT scan may be done to check for other injuries. How is this treated? Usually, no treatment is needed. Your health care provider may recommend eye drops or cold compresses to help with discomfort. Follow these instructions at home:  Take over-the-counter and prescription medicines only as directed by your health  care provider.  Use eye drops or cold compresses to help with discomfort as directed by your health care provider.  Avoid activities, things, and environments that may irritate or injure your eye.  Keep all follow-up visits as told by your health care provider. This is important. Contact a health care provider if:  You have pain in your eye.  The bleeding does not go away within 3 weeks.  You keep getting new subconjunctival hemorrhages. Get help right away if:  Your vision changes or you have difficulty seeing.  You suddenly develop severe sensitivity to light.  You develop a severe headache, persistent vomiting, confusion, or abnormal tiredness (lethargy).  Your eye seems to bulge or protrude from your eye socket.  You develop unexplained bruises on your body.  You have unexplained bleeding in another area of your body. This information is not intended to replace advice given to you by your health care provider. Make sure you discuss any questions you have with your health care provider. Document Released: 12/22/2004 Document Revised: 08/23/2017 Document Reviewed: 02/28/2014 Elsevier Interactive Patient Education  2019 [employer].

## 2018-01-18 LAB — COMPLETE METABOLIC PANEL WITH GFR
AG RATIO: 1.5 (calc) (ref 1.0–2.5)
ALBUMIN MSPROF: 4.3 g/dL (ref 3.6–5.1)
ALT: 22 U/L (ref 9–46)
AST: 19 U/L (ref 10–35)
Alkaline phosphatase (APISO): 111 U/L (ref 40–115)
BUN/Creatinine Ratio: 20 (calc) (ref 6–22)
BUN: 42 mg/dL — ABNORMAL HIGH (ref 7–25)
CO2: 27 mmol/L (ref 20–32)
Calcium: 9.5 mg/dL (ref 8.6–10.3)
Chloride: 102 mmol/L (ref 98–110)
Creat: 2.11 mg/dL — ABNORMAL HIGH (ref 0.70–1.18)
GFR, Est African American: 34 mL/min/{1.73_m2} — ABNORMAL LOW (ref >=60)
GFR, Est Non African American: 29 mL/min/{1.73_m2} — ABNORMAL LOW (ref >=60)
Globulin: 2.8 g/dL (calc) (ref 1.9–3.7)
Glucose, Bld: 285 mg/dL — ABNORMAL HIGH (ref 65–99)
POTASSIUM: 4.1 mmol/L (ref 3.5–5.3)
Sodium: 141 mmol/L (ref 135–146)
Total Bilirubin: 0.8 mg/dL (ref 0.2–1.2)
Total Protein: 7.1 g/dL (ref 6.1–8.1)

## 2018-01-18 LAB — CBC WITH DIFFERENTIAL/PLATELET
Absolute Monocytes: 410 cells/uL (ref 200–950)
Basophils Absolute: 32 cells/uL (ref 0–200)
Basophils Relative: 0.5 %
Eosinophils Absolute: 101 cells/uL (ref 15–500)
Eosinophils Relative: 1.6 %
HCT: 37.4 % — ABNORMAL LOW (ref 38.5–50.0)
Hemoglobin: 12.6 g/dL — ABNORMAL LOW (ref 13.2–17.1)
LYMPHS ABS: 662 {cells}/uL — AB (ref 850–3900)
MCH: 32.9 pg (ref 27.0–33.0)
MCHC: 33.7 g/dL (ref 32.0–36.0)
MCV: 97.7 fL (ref 80.0–100.0)
MPV: 9 fL (ref 7.5–12.5)
Monocytes Relative: 6.5 %
Neutro Abs: 5097 cells/uL (ref 1500–7800)
Neutrophils Relative %: 80.9 %
Platelets: 171 10*3/uL (ref 140–400)
RBC: 3.83 10*6/uL — ABNORMAL LOW (ref 4.20–5.80)
RDW: 13.3 % (ref 11.0–15.0)
Total Lymphocyte: 10.5 %
WBC: 6.3 10*3/uL (ref 3.8–10.8)

## 2018-01-18 LAB — PROTIME-INR
INR: 2.6 — ABNORMAL HIGH
Prothrombin Time: 25.6 s — ABNORMAL HIGH (ref 9.0–11.5)

## 2018-02-02 DIAGNOSIS — E790 Hyperuricemia without signs of inflammatory arthritis and tophaceous disease: Secondary | ICD-10-CM | POA: Diagnosis not present

## 2018-02-02 DIAGNOSIS — M79643 Pain in unspecified hand: Secondary | ICD-10-CM | POA: Diagnosis not present

## 2018-02-02 DIAGNOSIS — M109 Gout, unspecified: Secondary | ICD-10-CM | POA: Diagnosis not present

## 2018-02-02 DIAGNOSIS — N189 Chronic kidney disease, unspecified: Secondary | ICD-10-CM | POA: Diagnosis not present

## 2018-02-02 DIAGNOSIS — M79644 Pain in right finger(s): Secondary | ICD-10-CM | POA: Diagnosis not present

## 2018-02-02 DIAGNOSIS — M1990 Unspecified osteoarthritis, unspecified site: Secondary | ICD-10-CM | POA: Diagnosis not present

## 2018-02-06 NOTE — Progress Notes (Deleted)
Chief Complaint: Patient presents for evaluation of skin lesions. Patient has erythematous, scaly lesions on {body part:18749}, changing in shape.   Exam: {lesion type:315286::"normal complete skin exam, no suspicious lesions"}  Procedure Details   The risks, benefits, indications, potential complications, and alternatives were explained to the patient and informed consent obtained.  Liquid nitrogen was use in a 3 freeze and thaw technique. The patient tolerated the procedure well.   Condition: Stable  Complications:  None  Diagnosis: Actinic Keratosis Seb. Dermatitis  Procedure code:  [id number]  Plan: 1. Patient educated that the area will begin to heal in approximately a week.  2. Warning signs of infection were reviewed.    3. Recommended that the patient use {meds; pain:16413} as needed for pain.

## 2018-02-08 ENCOUNTER — Encounter: Payer: Self-pay | Admitting: Physician Assistant

## 2018-02-10 ENCOUNTER — Encounter (INDEPENDENT_AMBULATORY_CARE_PROVIDER_SITE_OTHER): Payer: Medicare Other | Admitting: Ophthalmology

## 2018-02-10 DIAGNOSIS — H338 Other retinal detachments: Secondary | ICD-10-CM

## 2018-02-10 DIAGNOSIS — H353132 Nonexudative age-related macular degeneration, bilateral, intermediate dry stage: Secondary | ICD-10-CM

## 2018-02-10 DIAGNOSIS — I10 Essential (primary) hypertension: Secondary | ICD-10-CM | POA: Diagnosis not present

## 2018-02-10 DIAGNOSIS — H43813 Vitreous degeneration, bilateral: Secondary | ICD-10-CM

## 2018-02-10 DIAGNOSIS — H35033 Hypertensive retinopathy, bilateral: Secondary | ICD-10-CM

## 2018-02-10 DIAGNOSIS — H34832 Tributary (branch) retinal vein occlusion, left eye, with macular edema: Secondary | ICD-10-CM

## 2018-02-16 NOTE — Progress Notes (Signed)
Assessment and Plan:  [name] was seen today for acute visit.  Diagnoses and all orders for this visit:  Essential hypertension At goal; Continue medications Monitor blood pressure at home; call if consistently over 130/80 Continue DASH diet.   Reminder to go to the ER if any CP, SOB, nausea, dizziness, severe HA, changes vision/speech, left arm numbness and tingling and jaw pain.  Chronic diastolic congestive heart failure (HCC) Weights down, at baseline Disease process and medications discussed. Questions answered fully. Emphasized salt restriction, less than 2031m a day. Encouraged daily monitoring of the patient's weight, call office if 5 lb weight loss or gain in a day.  Encouraged regular exercise. If any increasing shortness of breath, swelling, or chest pressure go to ER immediately.  decrease your fluid intake to less than 2 L daily please remember to always increase your potassium intake with any increase of your fluid pill.   Actinic keratoses 1. Instructed to keep the wound dry and covered for 24-48 hours and clean thereafter. 2. Warning signs of infection were reviewed & pt advised to call if any questions/problems.    3. Recommended that the patient use  Tylenol/ibuprofen/aleve   as needed for pain.    Other viral warts May require repeat cryo-  1. Instructed to keep the wound dry and covered for 24-48 hours and clean thereafter. 2. Warning signs of infection were reviewed & pt advised to call if any questions/problems.    3. Recommended that the patient use  Tylenol/ibuprofen/aleve   as needed for pain.    Further disposition pending results of labs. Discussed med's effects and SE's.   Over 30 minutes of exam, counseling, chart review, and critical decision making was performed.   Future Appointments  Date Time Provider [place] 02/22/2018  2:30 PM MUnk Pinto MD [facility] None  05/18/2018  2:00 PM MUnk Pinto MD [facility] None  05/19/2018  12:45 PM MHayden Pedro MD [facility] None  09/19/2018  3:00 PM CLiane Comber NP [facility] None    ------------------------------------------------------------------------------------------------------------------   HPI 78y.o.male presents for htn, CHF monitoring and freezing of actinic keratoses to scalp and wart over R 2nd digit PIP joint.   His blood pressure has been controlled at home, today their BP is BP: 106/70  He does not workout. He denies chest pain, shortness of breath, dizziness.   He has a history of Diastolic CHF (ECHO 27494EF 60-65%), denies dyspnea on exertion, orthopnea, paroxysmal nocturnal dyspnea and edema. Positive for none. He reports he did note weight trending up approx 1 week ago, some edema in legs and took 1 dose of metalazone which quickly resolved edema and weights are down.  Wt Readings from Last 3 Encounters:  02/17/18 192 lb (87.1 kg)  01/17/18 196 lb (88.9 kg)  12/13/17 194 lb (88 kg)    Exam: 2 actinic keratoses to crown of scalp, warty growth over R hand 2nd digit PIP joint medially.   Procedure Details   The risks, benefits, indications, potential complications were explained to the patient.  With informed consent the actinic keratoses were treated with liquid nitrogen by double freeze/thaw technique, triple freeze/thaw for wart.   Diagnosis: actinic keratoses x 2, other viral wart  Procedure code:  [id number] Condition: Stable  Complications:  None  He additionally follows closely with dermatology for numerous moles to back.      Past Medical History:  Diagnosis Date  . Adrenal adenoma   . Anemia   . Anxiety   .  Arthritis   . Atypical atrial flutter ([place]) 8/15, 10/15   a. DCCV 08/2013. b. s/p RFA 10/2013.  [name] Basal cell carcinoma   . CAD (coronary artery disease)    a. 04/2013 CABG x 2: LIMA to LAD, SVG to RI, EVH via R thigh.  . Cellulitis 12/2015   left leg  . Chronic diastolic congestive heart failure ([place])   . CKD  (chronic kidney disease), stage III ([place])   . Depression   . Diabetes mellitus type II   . Diverticulosis 2001  . DJD (degenerative joint disease)   . GERD (gastroesophageal reflux disease)   . Gout   . H/O hiatal hernia   . History of cardioversion    x3 (years uncertain)  . Hx of adenomatous colonic polyps   . Hyperlipidemia   . Hypertension   . Hypertensive cardiomyopathy ([place])   . Obstructive sleep apnea    compliant with CPAP  . 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 Persistent atrial fibrillation    a. s/p MAZE 04/2013 in setting of CABG. b. Amio stopped in 10/2013 after flutter ablation.  [name] PFO (patent foramen ovale)    a. Small PFO by TEE 10/2013.  [name] Pleural effusion, left    a. s/p thoracentesis 05/2013.  [name] Respiratory failure ([place])    a. Hypoxia 10/2013 - required supp O2 as inpatient, did not require it at discharge.  . S/P Maze operation for atrial fibrillation    a. 04/2013: Complete bilateral atrial lesion set using cryothermy and bipolar radiofrequency ablation with clipping of LA appendage (@ time of CABG)  . S/P Maze operation for atrial fibrillation 04/05/2013   Complete bilateral atrial lesion set using cryothermy and bipolar radiofrequency ablation with clipping of LA appendage via median sternotomy approach      Allergies  Allergen Reactions  . Sunflower Seed [Sunflower Oil] Swelling and Other (See Comments)    Tongue and lip swelling  . Horse-Derived Products Other (See Comments)    Per allergy skin test UNSPECIFIED REACTION   . Tetanus Toxoids Other (See Comments)    Per allergy skin test  . Tetanus Toxoid     Other reaction(s): Other (See Comments) Rash(horse serum)    Current Outpatient Medications on File Prior to Visit  Medication Sig  . ACCU-CHEK AVIVA PLUS test strip CHECK BLOOD GLUCOSE 3 TIMES DAILY.  [name] acetaminophen (TYLENOL) 500 MG  tablet Take 1,000 mg by mouth every 6 (six) hours as needed for moderate pain or headache.   . allopurinol (ZYLOPRIM) 300 MG tablet Take 1 tablet daily to prevent gout (Patient taking differently: Take 300 mg by mouth daily. )  . ALPRAZolam (XANAX) 1 MG tablet Take 0.5 tablets (0.5 mg total) by mouth at bedtime as needed for sleep.  [name] Alum Hydroxide-Mag Carbonate (GAVISCON PO) Take 4 tablets by mouth daily as needed (acid reflux).   [name] aspirin EC 81 MG tablet Take 81 mg by mouth daily.  [name] atorvastatin (LIPITOR) 40 MG tablet Take 20 mg by mouth every Monday, Wednesday, and Friday.   [name] azelastine (ASTELIN) 0.1 % nasal spray PLACE 2 SPRAYS INTO BOTH NOSTRILS 2 (TWO) TIMES DAILY. USE IN EACH NOSTRIL AS DIRECTED  . B Complex Vitamins (VITAMIN B COMPLEX PO) Take 1 tablet by mouth at bedtime.   . bacitracin 500 UNIT/GM ointment Apply 1 application topically daily as needed for wound care.  . benzonatate (  TESSALON) 200 MG capsule Take 1 capsule (200 mg total) by mouth 3 (three) times daily as needed for cough (Max: 639m per day).  . Blood Glucose Monitoring Suppl (ACCU-CHEK AVIVA PLUS) w/Device KIT Check blood sugar 1 time  daily  . Blood Glucose Monitoring Suppl DEVI Test blood sugar up to three times a day or as directed.  . carvedilol (COREG) 6.25 MG tablet Take 1 tablet (6.25 mg total) by mouth 3 (three) times daily.  . Cholecalciferol (VITAMIN D PO) Take 5,000 Units by mouth 2 (two) times daily.  . colchicine 0.6 MG tablet Take 0.6 mg by mouth daily as needed (for gout flare up).   . diphenhydrAMINE (BENADRYL) 25 MG tablet Take 25 mg by mouth at bedtime as needed for allergies.   . ferrous sulfate 325 (65 FE) MG tablet Take 325 mg by mouth 2 (two) times daily with a meal.  . fluticasone (FLONASE) 50 MCG/ACT nasal spray Place 1 spray into both nostrils daily as needed for allergies.  .Marland KitchenHYDROcodone-acetaminophen (NORCO/VICODIN) 5-325 MG tablet Take 1-2 tablets by mouth every 6 (six) hours as needed for  moderate pain.   .Marland Kitcheninsulin NPH-regular Human (NOVOLIN 70/30) (70-30) 100 UNIT/ML injection Inject 25 Units into the skin daily. 15 units AM, 10 units PM  . lidocaine (LIDODERM) 5 % Place 1 patch onto the skin daily as needed (for pain). Remove & Discard patch within 12 hours or as directed by MD   . loratadine (CLARITIN) 10 MG tablet Take 10 mg by mouth daily as needed for allergies.   . metolazone (ZAROXOLYN) 5 MG tablet TAKE 1 TABLET BY MOUTH  DAILY AS NEEDED (WEIGHT  GAIN AND EDEMA)  . Multiple Vitamin (MULTIVITAMIN WITH MINERALS) TABS tablet Take 1 tablet by mouth at bedtime.   . Multiple Vitamins-Minerals (PRESERVISION AREDS 2) CAPS Take 1 capsule by mouth 2 (two) times daily.   . polyvinyl alcohol (ARTIFICIAL TEARS) 1.4 % ophthalmic solution Place 1 drop into both eyes daily as needed for dry eyes.  . potassium chloride SA (K-DUR,KLOR-CON) 20 MEQ tablet Take 20 mEq by mouth 4 (four) times daily.   .Marland KitchenPRESCRIPTION MEDICATION Inhale into the lungs at bedtime. CPAP  . sertraline (ZOLOFT) 100 MG tablet TAKE 1 TABLET BY MOUTH  DAILY  . torsemide (DEMADEX) 100 MG tablet Take 100 mg in AM and 50 mg in PM if needed for weight gain (Patient taking differently: Take 50-100 mg by mouth See admin instructions. TAKE 1 TABLET (100 MG) BY MOUTH SCHEDULED IN THE MORNING, AND MAY TAKE 0.5 TABLET (50 MG) IN THE EVENING IF NEEDED FOR SWELLING/WEIGHT GAIN)  . warfarin (COUMADIN) 5 MG tablet Take 1 to 2 tablets daily as directed   No current facility-administered medications on file prior to visit.     ROS: all negative except above.   Physical Exam:  BP 106/70   Pulse 87   Temp 97.9 F (36.6 C)   Ht 5' 11"  (1.803 m)   Wt 192 lb (87.1 kg)   SpO2 94%   BMI 26.78 kg/m   General Appearance: Well nourished, in no apparent distress. Eyes: conjunctiva no swelling or erythema ENT/Mouth: Hearing normal.  Neck: Supple Respiratory: Respiratory effort normal, BS equal bilaterally without rales, rhonchi,  wheezing or stridor.  Cardio: RRR with no MRGs. Brisk peripheral pulses without edema.  Musculoskeletal: normal gait.  Skin: Warm, dry; lesions x 2 with erythematous base, crusty/keratotic cap; warty growth, <5 mm to R 2nd digit PIP medial aspect; numerous  seb keratoses to face, shoulders, torso Neuro: Normal muscle tone, Sensation intact.  Psych: Awake and oriented X 3, normal affect, Insight and Judgment appropriate.     Izora Ribas, NP 11:50 AM [name]

## 2018-02-17 ENCOUNTER — Ambulatory Visit (INDEPENDENT_AMBULATORY_CARE_PROVIDER_SITE_OTHER): Payer: Medicare Other | Admitting: Adult Health

## 2018-02-17 ENCOUNTER — Encounter: Payer: Self-pay | Admitting: Adult Health

## 2018-02-17 VITALS — BP 106/70 | HR 87 | Temp 97.9°F | Ht 71.0 in | Wt 192.0 lb

## 2018-02-17 DIAGNOSIS — B078 Other viral warts: Secondary | ICD-10-CM

## 2018-02-17 DIAGNOSIS — L570 Actinic keratosis: Secondary | ICD-10-CM

## 2018-02-17 DIAGNOSIS — N183 Chronic kidney disease, stage 3 (moderate): Secondary | ICD-10-CM | POA: Diagnosis not present

## 2018-02-17 DIAGNOSIS — I10 Essential (primary) hypertension: Secondary | ICD-10-CM

## 2018-02-17 DIAGNOSIS — I5032 Chronic diastolic (congestive) heart failure: Secondary | ICD-10-CM

## 2018-02-21 ENCOUNTER — Encounter: Payer: Self-pay | Admitting: Internal Medicine

## 2018-02-21 NOTE — Progress Notes (Signed)
This very nice 78 y.o. MWM  presents for 3 month follow up with HTN, HLD, Insulin Requiring T2_DM,  and Vitamin D Deficiency. Patient has Gout quiescent on Allopurinol. He also has OSA on CPAP with improved restorative sleep.      Patient is treated for HTN (1980) & BP has been controlled at home. Today's BP is at goal - 130/74.  In 2015, he underwent CABG & MAZE procedure and also had RFA for Afib/flutter.  H4e is followed on Coumadin w/o complications of excess bleeding. 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   He reports O2 sat's at rest are usually about 97% , but quickly drop to the low 90's with ambulation.  Patient has no current complaints of any cardiac type chest pain, palpitations, orthopnea / PND, dizziness or claudication. He does endorse DOE and dependent edema. Patient is also followed by Dr Lawson Radar / [facility].      Hyperlipidemia is controlled with diet & meds. Patient denies myalgias or other med SE's. Last Lipids were at goal except very elevated Trig's: Lab Results  Component Value Date   CHOL 170 11/08/2017   HDL 28 (L) 11/08/2017   LDLCALC  not calculated. 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 He reports am fasting glucoses are usually about 100-120 mg% and 3 hr pc glucoses are usually about 180 mg% at bedtime. He has had no symptoms of reactive hypoglycemia, diabetic polys, paresthesias or visual blurring.  Last A1c was not at goal: Lab Results  Component Value Date   HGBA1C 7.1 (H) 02/22/2018      Further, the patient also has history of Vitamin D Deficiency ("39" / 2008) and supplements vitamin D without any suspected side-effects. Last vitamin D  was at goal:  Lab Results  Component Value Date   VD25OH 86 02/22/2018   Current Outpatient Medications on File Prior to Visit  Medication Sig  . ACCU-CHEK AVIVA PLUS test strip CHECK BLOOD GLUCOSE 3 TIMES DAILY.  [name] acetaminophen (TYLENOL) 500 MG tablet Take 1,000 mg by mouth every 6 (six) hours as needed for moderate pain or headache.   . ALPRAZolam (XANAX) 1 MG tablet Take 0.5 tablets (0.5 mg total) by mouth at bedtime as needed for sleep.  [name] Alum Hydroxide-Mag Carbonate (GAVISCON PO) Take 4 tablets by mouth daily as needed (acid reflux).   [name] aspirin EC 81 MG tablet Take 81 mg by mouth daily.  [name] atorvastatin (LIPITOR) 40 MG tablet Take 20 mg by mouth every Monday, Wednesday, and Friday.   [name] azelastine (ASTELIN) 0.1 % nasal spray PLACE 2 SPRAYS INTO BOTH NOSTRILS 2 (TWO) TIMES DAILY. USE IN EACH NOSTRIL AS DIRECTED  . B Complex Vitamins (VITAMIN B COMPLEX PO) Take 1 tablet by mouth at bedtime.   . bacitracin 500 UNIT/GM ointment Apply 1 application topically daily as needed for wound care.  . benzonatate (TESSALON) 200 MG capsule Take 1 capsule (200 mg total) by mouth 3 (three) times daily as needed for cough (Max: 663m per day).  . Blood Glucose Monitoring Suppl (ACCU-CHEK AVIVA PLUS) w/Device KIT Check blood sugar 1 time  daily  . Blood Glucose Monitoring Suppl  DEVI Test blood sugar up to three times a day or as directed.  . carvedilol (COREG) 6.25 MG tablet Take 1 tablet (6.25 mg total) by mouth 3 (three) times daily.  . Cholecalciferol (VITAMIN D PO) Take 5,000 Units by mouth 2 (two) times daily.  . colchicine 0.6 MG tablet Take 0.6 mg by mouth daily as needed (for gout flare up).   . diphenhydrAMINE (BENADRYL) 25 MG tablet Take 25 mg by mouth at bedtime as needed for allergies.   . ferrous sulfate 325 (65 FE) MG tablet Take 325 mg by mouth 2 (two) times daily with a meal.  . fluticasone (FLONASE) 50 MCG/ACT nasal spray Place 1 spray into both nostrils daily as needed for allergies.  [name]  HYDROcodone-acetaminophen (NORCO/VICODIN) 5-325 MG tablet Take 1-2 tablets by mouth every 6 (six) hours as needed for moderate pain.   [name] insulin NPH-regular Human (NOVOLIN 70/30) (70-30) 100 UNIT/ML injection Inject 25 Units into the skin daily. 15 units AM, 10 units PM  . lidocaine (LIDODERM) 5 % Place 1 patch onto the skin daily as needed (for pain). Remove & Discard patch within 12 hours or as directed by MD   . loratadine (CLARITIN) 10 MG tablet Take 10 mg by mouth daily as needed for allergies.   . metolazone (ZAROXOLYN) 5 MG tablet TAKE 1 TABLET BY MOUTH  DAILY AS NEEDED (WEIGHT  GAIN AND EDEMA)  . Multiple Vitamin (MULTIVITAMIN WITH MINERALS) TABS tablet Take 1 tablet by mouth at bedtime.   . Multiple Vitamins-Minerals (PRESERVISION AREDS 2) CAPS Take 1 capsule by mouth 2 (two) times daily.   . polyvinyl alcohol (ARTIFICIAL TEARS) 1.4 % ophthalmic solution Place 1 drop into both eyes daily as needed for dry eyes.  . potassium chloride SA (K-DUR,KLOR-CON) 20 MEQ tablet Take 20 mEq by mouth 4 (four) times daily.   [name] PRESCRIPTION MEDICATION Inhale into the lungs at bedtime. CPAP  . sertraline (ZOLOFT) 100 MG tablet TAKE 1 TABLET BY MOUTH  DAILY  . torsemide (DEMADEX) 100 MG tablet Take 100 mg in AM and 50 mg in PM if needed for weight gain (Patient taking differently: Take 50-100 mg by mouth See admin instructions. TAKE 1 TABLET (100 MG) BY MOUTH SCHEDULED IN THE MORNING, AND MAY TAKE 0.5 TABLET (50 MG) IN THE EVENING IF NEEDED FOR SWELLING/WEIGHT GAIN)  . warfarin (COUMADIN) 5 MG tablet Take 1 to 2 tablets daily as directed  . allopurinol (ZYLOPRIM) 300 MG tablet Take 1 tablet daily to prevent gout (Patient taking differently: Take 300 mg by mouth daily. )   No current facility-administered medications on file prior to visit.    Allergies  Allergen Reactions  . Sunflower Seed [Sunflower Oil] Swelling and Other (See Comments)    Tongue and lip swelling  . Horse-Derived Products Other (See  Comments)    Per allergy skin test UNSPECIFIED REACTION   . Tetanus Toxoids Other (See Comments)    Per allergy skin test  . Tetanus Toxoid     Other reaction(s): Other (See Comments) Rash(horse serum)   PMHx:   Past Medical History:  Diagnosis Date  . Adrenal adenoma   . Anemia   . Anxiety   . Arthritis   . Atypical atrial flutter ([place]) 8/15, 10/15   a. DCCV 08/2013. b. s/p RFA 10/2013.  [name] Basal cell carcinoma   . CAD (coronary artery disease)    a. 04/2013 CABG x 2: LIMA to LAD, SVG to RI, EVH via R thigh.  [name]  Cellulitis 12/2015   left leg  . Chronic diastolic congestive heart failure ([place])   . CKD (chronic kidney disease), stage III ([place])   . Depression   . Diabetes mellitus type II   . Diverticulosis 2001  . DJD (degenerative joint disease)   . GERD (gastroesophageal reflux disease)   . Gout   . H/O hiatal hernia   . History of cardioversion    x3 (years uncertain)  . Hx of adenomatous colonic polyps   . Hyperlipidemia   . Hypertension   . Hypertensive cardiomyopathy ([place])   . Obstructive sleep apnea    compliant with CPAP  . 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 Persistent atrial fibrillation    a. s/p MAZE 04/2013 in setting of CABG. b. Amio stopped in 10/2013 after flutter ablation.  [name] PFO (patent foramen ovale)    a. Small PFO by TEE 10/2013.  [name] Pleural effusion, left    a. s/p thoracentesis 05/2013.  [name] Respiratory failure ([place])    a. Hypoxia 10/2013 - required supp O2 as inpatient, did not require it at discharge.  . S/P Maze operation for atrial fibrillation    a. 04/2013: Complete bilateral atrial lesion set using cryothermy and bipolar radiofrequency ablation with clipping of LA appendage (@ time of CABG)  . S/P Maze operation for atrial fibrillation 04/05/2013   Complete bilateral atrial lesion set using cryothermy and bipolar radiofrequency ablation  with clipping of LA appendage via median sternotomy approach    Immunization History  Administered Date(s) Administered  . DT 08/05/2015  . Influenza Split 10/25/2012  . Influenza, High Dose Seasonal PF 09/26/2013, 09/27/2014, 10/03/2015, 10/05/2016, 11/11/2017  . Pneumococcal Conjugate-13 01/30/2014  . Pneumococcal Polysaccharide-23 10/20/2011  . Td 01/06/2000   Past Surgical History:  Procedure Laterality Date  . APPENDECTOMY  03/05/2017   laproscopic  . ATRIAL FIBRILLATION ABLATION N/A 10/26/2013   Procedure: ATRIAL FIBRILLATION ABLATION;  Surgeon: Coralyn Mark, MD;  Location: [place] CATH LAB;  Service: Cardiovascular;  Laterality: N/A;  . BASAL CELL CARCINOMA EXCISION     x3 on face  . CARDIAC CATHETERIZATION     myocardial bridge but no cad  . CARDIOVERSION N/A 08/23/2013   Procedure: CARDIOVERSION;  Surgeon: Sanda Klein, MD;  Location: [place];  Service: Cardiovascular;  Laterality: N/A;  . CARPOMETACARPEL SUSPENSION PLASTY Left 02/14/2014   Procedure: CARPOMETACARPEL ([place]) SUSPENSIONPLASTY THUMB  WITH  ABDUCTOR POLLICIS LONGUS TRANSFER AND STENOSING TENOSYNOVITIS RELEASE LEFT WRIST;  Surgeon: Charlotte Crumb, MD;  Location: [place];  Service: Orthopedics;  Laterality: Left;  . CATARACT EXTRACTION     bilateral  . COLONOSCOPY WITH PROPOFOL N/A 01/26/2017   Procedure: COLONOSCOPY WITH PROPOFOL;  Surgeon: Doran Stabler, MD;  Location: [facility] ENDOSCOPY;  Service: Gastroenterology;  Laterality: N/A;  . CORONARY ARTERY BYPASS GRAFT N/A 04/05/2013   Procedure: CORONARY ARTERY BYPASS GRAFTING (CABG) TIMES TWO USING LEFT INTERNAL MAMMARY ARTERY AND RIGHT SAPHENOUS LEG VEIN HARVESTED ENDOSCOPICALLY;  Surgeon: Rexene Alberts, MD;  Location: [place];  Service: Open Heart Surgery;  Laterality: N/A;  . ESOPHAGOGASTRODUODENOSCOPY (EGD) WITH PROPOFOL N/A 01/26/2017   Procedure: ESOPHAGOGASTRODUODENOSCOPY (EGD) WITH PROPOFOL;  Surgeon: Doran Stabler, MD;  Location: [facility]  ENDOSCOPY;  Service: Gastroenterology;  Laterality: N/A;  . GREAT TOE ARTHRODESIS, INTERPHALANGEAL JOINT     Right foot  . INTRAOPERATIVE TRANSESOPHAGEAL ECHOCARDIOGRAM N/A 04/05/2013   Procedure: INTRAOPERATIVE TRANSESOPHAGEAL  ECHOCARDIOGRAM;  Surgeon: Rexene Alberts, MD;  Location: [place];  Service: Open Heart Surgery;  Laterality: N/A;  . LAPAROSCOPIC APPENDECTOMY N/A 03/05/2017   Procedure: APPENDECTOMY LAPAROSCOPIC;  Surgeon: Judeth Horn, MD;  Location: [place];  Service: General;  Laterality: N/A;  . LEFT HEART CATHETERIZATION WITH CORONARY ANGIOGRAM N/A 03/07/2013   Procedure: LEFT HEART CATHETERIZATION WITH CORONARY ANGIOGRAM;  Surgeon: Burnell Blanks, MD;  Location: [facility] CATH LAB;  Service: Cardiovascular;  Laterality: N/A;  . MAZE N/A 04/05/2013   Procedure: MAZE;  Surgeon: Rexene Alberts, MD;  Location: [place];  Service: Open Heart Surgery;  Laterality: N/A;  . Polinydal cyst     Removed  . POLYPECTOMY    . Retina repair-right    . RIGHT HEART CATHETERIZATION N/A 05/03/2014   Procedure: RIGHT HEART CATH;  Surgeon: Jolaine Artist, MD;  Location: [facility] CATH LAB;  Service: Cardiovascular;  Laterality: N/A;  . TEE WITHOUT CARDIOVERSION N/A 08/23/2013   Procedure: TRANSESOPHAGEAL ECHOCARDIOGRAM (TEE);  Surgeon: Sanda Klein, MD;  Location: [facility] ENDOSCOPY;  Service: Cardiovascular;  Laterality: N/A;  . TEE WITHOUT CARDIOVERSION N/A 10/26/2013   Procedure: TRANSESOPHAGEAL ECHOCARDIOGRAM (TEE);  Surgeon: Sueanne Margarita, MD;  Location: [facility] ENDOSCOPY;  Service: Cardiovascular;  Laterality: N/A;  . TEE WITHOUT CARDIOVERSION N/A 06/05/2014   Procedure: TRANSESOPHAGEAL ECHOCARDIOGRAM (TEE);  Surgeon: Thayer Headings, MD;  Location: [facility] ENDOSCOPY;  Service: Cardiovascular;  Laterality: N/A;  . TRAPEZIUM RESECTION     FHx:    Reviewed / unchanged  SHx:    Reviewed / unchanged   Systems Review:  Constitutional: Denies fever, chills, wt changes, headaches, insomnia, fatigue, night sweats, change in  appetite. Eyes: Denies redness, blurred vision, diplopia, discharge, itchy, watery eyes.  ENT: Denies discharge, congestion, post nasal drip, epistaxis, sore throat, earache, hearing loss, dental pain, tinnitus, vertigo, sinus pain, snoring.  CV: Denies chest pain, palpitations, irregular heartbeat, syncope, dyspnea, diaphoresis, orthopnea, PND, claudication or edema. Respiratory: denies cough, dyspnea, DOE, pleurisy, hoarseness, laryngitis, wheezing.  Gastrointestinal: Denies dysphagia, odynophagia, heartburn, reflux, water brash, abdominal pain or cramps, nausea, vomiting, bloating, diarrhea, constipation, hematemesis, melena, hematochezia  or hemorrhoids. Genitourinary: Denies dysuria, frequency, urgency, nocturia, hesitancy, discharge, hematuria or flank pain. Musculoskeletal: Denies arthralgias, myalgias, stiffness, jt. swelling, pain, limping or strain/sprain.  Skin: Denies pruritus, rash, hives, warts, acne, eczema or change in skin lesion(s). Neuro: No weakness, tremor, incoordination, spasms, paresthesia or pain. Psychiatric: Denies confusion, memory loss or sensory loss. Endo: Denies change in weight, skin or hair change.  Heme/Lymph: No excessive bleeding, bruising or enlarged lymph nodes.  Physical Exam  BP 130/74   Pulse 72   Temp 97.6 F (36.4 C)   Resp 16   Ht 5' 11"  (1.803 m)   Wt 195 lb (88.5 kg)   BMI 27.20 kg/m   Appears  well nourished, well groomed  and in no distress.  Eyes: PERRLA, EOMs, conjunctiva no swelling or erythema. Sinuses: No frontal/maxillary tenderness ENT/Mouth: EAC's clear, TM's nl w/o erythema, bulging. Nares clear w/o erythema, swelling, exudates. Oropharynx clear without erythema or exudates. Oral hygiene is good. Tongue normal, non obstructing. Hearing intact.  Neck: Supple. Thyroid not palpable. Car 2+/2+ without bruits, nodes or JVD. Chest: Respirations nl with BS clear & equal w/o rales, rhonchi, wheezing or stridor.  Cor: Heart sounds  normal w/ regular rate and rhythm without sig. murmurs, gallops, clicks or rubs. Peripheral pulses normal and equal  without edema.  Abdomen: Soft & bowel sounds normal. Non-tender w/o guarding, rebound,  hernias, masses or organomegaly.  Lymphatics: Unremarkable.  Musculoskeletal: Full ROM all peripheral extremities, joint stability, 5/5 strength and normal gait.  Skin: Warm, dry without exposed rashes, lesions or ecchymosis apparent.  Neuro: Cranial nerves intact, reflexes equal bilaterally. Sensory-motor testing grossly intact. Tendon reflexes grossly intact.  Pysch: Alert & oriented x 3.  Insight and judgement nl & appropriate. No ideations.  Assessment and Plan:  1. Essential hypertension  - Continue medication, monitor blood pressure at home.  - Continue DASH diet.  Reminder to go to the ER if any CP,  SOB, nausea, dizziness, severe HA, changes vision/speech.  - CBC with Differential/Platelet - COMPLETE METABOLIC PANEL WITH GFR - Magnesium - TSH  2. Hyperlipidemia, mixed  - Continue diet/meds, exercise,& lifestyle modifications.  - Continue monitor periodic cholesterol/liver & renal functions   - Lipid panel - TSH  3. Type 2 diabetes mellitus with stage 4 chronic kidney disease, with long-term current use of insulin (HCC)  - Continue diet, exercise  - Lifestyle modifications.  - Monitor appropriate labs.  - CBC with Differential/Platelet - COMPLETE METABOLIC PANEL WITH GFR - Hemoglobin A1c  4. Vitamin D deficiency  - Continue supplementation.  - VITAMIN D 25 Hydroxyl  5. PAF (paroxysmal atrial fibrillation) ([place])  - Protime-INR  6. 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  - Lipid panel  7. CKD stage 3 due to type 2 diabetes mellitus (HCC)  - CBC with Differential/Platelet - COMPLETE METABOLIC PANEL WITH GFR - PTH, intact and calcium  8. Chronic restrictive lung disease  9. Chronic anticoagulation  - Protime-INR  10. Medication management  -  CBC with Differential/Platelet - COMPLETE METABOLIC PANEL WITH GFR - Magnesium - Lipid panel - TSH - Hemoglobin A1c - VITAMIN D 25 Hydroxyl - PTH, intact and calcium        Discussed  regular exercise, BP monitoring, weight control to achieve/maintain BMI less than 25 and discussed med and SE's. Recommended labs to assess and monitor clinical status with further disposition pending results of labs. Over 30 minutes of exam, counseling, chart review was performed.

## 2018-02-21 NOTE — Patient Instructions (Signed)
Bleeding Precautions When on Anticoagulant Therapy  Anticoagulant therapy, also called blood thinner therapy, is medicine that helps to prevent and treat blood clots. The medicine works by stopping blood clots from forming or growing. Blood clots that form in your blood vessels can be dangerous. They can break loose and travel to the heart, lungs, or brain. This increases the risk of a heart attack, stroke, or blocked lung artery (pulmonary embolism). Anticoagulants also increase the risk of bleeding. Try to protect yourself from cuts and other injuries that can cause bleeding. It is important to take anticoagulants exactly as told by your health care provider. Why do I need to be on anticoagulant therapy? You may need this medicine if you are at risk of developing a blood clot. Conditions that increase your risk of a blood clot include:  Being born with heart disease or a heart malformation (congenital heart disease).  Developing heart disease.  Having had surgery, such as valve replacement.  Having had a serious accident or other type of severe injury (trauma).  Having certain types of cancer.  Having certain diseases that can increase blood clotting.  Having a high risk of stroke or heart attack.  Having atrial fibrillation (AF). What are the common anticoagulant medicines? There are several types of anticoagulant medicines. The most common types are:  Medicines that you take by mouth (oral medicines), such as: ? Warfarin. ? Novel oral anticoagulants (NOACs), such as: ? Direct thrombin inhibitors (dabigatran). ? Factor Xa inhibitors (apixaban, edoxaban, and rivaroxaban).  Injections, such as: ? Unfractionated heparin. ? Low molecular weight heparin. These anticoagulants work in different ways to prevent blood clots. They also have different risks and side effects. What do I need to remember while on anticoagulant therapy? Taking anticoagulants  Take your medicine at the same  time every day. If you forget to take your medicine, take it as soon as you remember. Do not double your dosage of medicine if you miss a whole day. Take your normal dose and call your health care provider.  Do not stop taking your medicine unless your health care provider approves. Stopping the medicine can increase your risk of developing a blood clot. Taking other medicines  Take over-the-counter and prescriptions medicines only as told by your health care provider.  Do not take over-the-counter NSAIDs, including aspirin and ibuprofen, while you are on anticoagulant therapy. These medicines increase your risk of dangerous bleeding.  Get approval from your health care provider before you start taking any new medicines, vitamins, or herbal products. Some of these could interfere with your therapy. General instructions  Keep all follow-up visits as told by your health care provider. This is important.  If you are pregnant or trying to get pregnant, talk with a health care provider about anticoagulants. Some of these medicines are not safe to take during pregnancy.  Tell all health care providers, including your dentist, that you are on anticoagulant therapy. It is especially important to tell providers before you have any surgery, medical procedures, or dental work done. What precautions should I take?   Be very careful when using knives, scissors, or other sharp objects.  Use an electric razor instead of a blade.  Do not use toothpicks.  Use a soft-bristled toothbrush. Brush your teeth gently.  Always wear shoes outdoors and wear slippers indoors.  Be careful when cutting your fingernails and toenails.  Place bath mats in the bathroom. If possible, install handrails as well.  Wear gloves while you do  yard work.  Wear your seat belt.  Prevent falls by removing loose rugs and extension cords from areas where you walk. Use a cane or walker if you need it.  Avoid constipation  by: ? Drinking enough fluid to keep your urine clear or pale yellow. ? Eating foods that are high in fiber, such as fresh fruits and vegetables, whole grains, and beans. ? Limiting foods that are high in fat and processed sugars, such as fried and sweet foods.  Do not play contact sports or participate in other activities that have a high risk for injury. What other precautions are important if on warfarin therapy? If you are taking a type of anticoagulant called warfarin, make sure you:  Work with a diet and nutrition specialist (dietitian) to make an eating plan. Do not make any sudden changes to your diet after you have started your eating plan.  Do not drink alcohol. It can interfere with your medicine and increase your risk of an injury that causes bleeding.  Get regular blood tests as told by your health care provider. What are some questions to ask my health care provider?  Why do I need anticoagulant therapy?  What is the best anticoagulant therapy for my condition?  How long will I need anticoagulant therapy?  What are the side effects of anticoagulant therapy?  When should I take my medicine? What should I do if I forget to take it?  Will I need to have regular blood tests?  Do I need to change my diet? Are there foods or drinks that I should avoid?  What activities are safe for me?  What should I do if I want to get pregnant? Contact a health care provider if:  You miss a dose of medicine: ? And you are not sure what to do. ? For more than one day.  You have: ? Menstrual bleeding that is heavier than normal. ? Bloody or brown urine. ? Easy bruising. ? Black and tarry stool or bright red stool. ? Side effects from your medicine.  You feel weak or dizzy.  You become pregnant. Get help right away if:  You have bleeding that will not stop within 20 minutes from: ? The nose. ? The gums. ? A cut on the skin.  You have a severe headache or  stomachache.  You vomit or cough up blood.  You fall or hit your head. Summary  Anticoagulant therapy, also called blood thinner therapy, is medicine that helps to prevent and treat blood clots.  Anticoagulants work in different ways to prevent blood clots. They also have different risks and side effects.  Talk with your health care provider about any precautions that you should take while on anticoagulant therapy. This information is not intended to replace advice given to you by your health care provider. Make sure you discuss any questions you have with your health care provider. Document Released: 12/03/2014 Document Revised: 03/10/2016 Document Reviewed: 03/10/2016 Elsevier Interactive Patient Education  2019 [employer].  +++++++++++++++++++++++++++++ Recommend Adult Low Dose Aspirin or  coated  Aspirin 81 mg daily  To reduce risk of Colon Cancer 20 %,  Skin Cancer 26 % ,  Melanoma 46%  and  Pancreatic cancer 60% +++++++++++++++++++++++++ Vitamin D goal  is between 70-100.  Please make sure that you are taking your Vitamin D as directed.  It is very important as a natural anti-inflammatory  helping hair, skin, and nails, as well as reducing stroke and heart attack risk.  It helps your bones and helps with mood. It also decreases numerous cancer risks so please take it as directed.  Low Vit D is associated with a 200-300% higher risk for CANCER  and 200-300% higher risk for HEART   ATTACK  &  STROKE.   .....................................[name] It is also associated with higher death rate at younger ages,  autoimmune diseases like Rheumatoid arthritis, Lupus, Multiple Sclerosis.    Also many other serious conditions, like depression, Alzheimer's Dementia, infertility, muscle aches, fatigue, fibromyalgia - just to name a few. ++++++++++++++++++++ Recommend the book "The END of DIETING" by Dr Excell Seltzer  & the book "The END of DIABETES " by Dr Excell Seltzer At [email or web address] -  get book & Audio CD's    Being diabetic has a  300% increased risk for heart attack, stroke, cancer, and alzheimer- type vascular dementia. It is very important that you work harder with diet by avoiding all foods that are white. Avoid white rice (brown & wild rice is OK), white potatoes (sweetpotatoes in moderation is OK), White bread or wheat bread or anything made out of white flour like bagels, donuts, rolls, buns, biscuits, cakes, pastries, cookies, pizza crust, and pasta (made from white flour & egg whites) - vegetarian pasta or spinach or wheat pasta is OK. Multigrain breads like Arnold's or Pepperidge Farm, or multigrain sandwich thins or flatbreads.  Diet, exercise and weight loss can reverse and cure diabetes in the early stages.  Diet, exercise and weight loss is very important in the control and prevention of complications of diabetes which affects every system in your body, ie. Brain - dementia/stroke, eyes - glaucoma/blindness, heart - heart attack/heart failure, kidneys - dialysis, stomach - gastric paralysis, intestines - malabsorption, nerves - severe painful neuritis, circulation - gangrene & loss of a leg(s), and finally cancer and Alzheimers.    I recommend avoid fried & greasy foods,  sweets/candy, white rice (brown or wild rice or Quinoa is OK), white potatoes (sweet potatoes are OK) - anything made from white flour - bagels, doughnuts, rolls, buns, biscuits,white and wheat breads, pizza crust and traditional pasta made of white flour & egg white(vegetarian pasta or spinach or wheat pasta is OK).  Multi-grain bread is OK - like multi-grain flat bread or sandwich thins. Avoid alcohol in excess. Exercise is also important.    Eat all the vegetables you want - avoid meat, especially red meat and dairy - especially cheese.  Cheese is the most concentrated form of trans-fats which is the worst thing to clog up our arteries. Veggie cheese is OK which can be found in the fresh produce section at  [employer] or [employer] or [employer]  +++++++++++++++++++++ DASH Eating Plan  DASH stands for "Dietary Approaches to Stop Hypertension."   The DASH eating plan is a healthy eating plan that has been shown to reduce high blood pressure (hypertension). Additional health benefits may include reducing the risk of type 2 diabetes mellitus, heart disease, and stroke. The DASH eating plan may also help with weight loss. WHAT DO I NEED TO KNOW ABOUT THE DASH EATING PLAN? For the DASH eating plan, you will follow these general guidelines:  Choose foods with a percent daily value for sodium of less than 5% (as listed on the food label).  Use salt-free seasonings or herbs instead of table salt or sea salt.  Check with your health care provider or pharmacist before using salt substitutes.  Eat lower-sodium products, often labeled as "lower sodium" or "  no salt added."  Eat fresh foods.  Eat more vegetables, fruits, and low-fat dairy products.  Choose whole grains. Look for the word "whole" as the first word in the ingredient list.  Choose fish   Limit sweets, desserts, sugars, and sugary drinks.  Choose heart-healthy fats.  Eat veggie cheese   Eat more home-cooked food and less restaurant, buffet, and fast food.  Limit fried foods.  Cook foods using methods other than frying.  Limit canned vegetables. If you do use them, rinse them well to decrease the sodium.  When eating at a restaurant, ask that your food be prepared with less salt, or no salt if possible.                      WHAT FOODS CAN I EAT? Read Dr Fara Olden Fuhrman's books on The End of Dieting & The End of Diabetes  Grains Whole grain or whole wheat bread. Brown rice. Whole grain or whole wheat pasta. Quinoa, bulgur, and whole grain cereals. Low-sodium cereals. Corn or whole wheat flour tortillas. Whole grain cornbread. Whole grain crackers. Low-sodium crackers.  Vegetables Fresh or frozen vegetables (raw, steamed,  roasted, or grilled). Low-sodium or reduced-sodium tomato and vegetable juices. Low-sodium or reduced-sodium tomato sauce and paste. Low-sodium or reduced-sodium canned vegetables.   Fruits All fresh, canned (in natural juice), or frozen fruits.  Protein Products  All fish and seafood.  Dried beans, peas, or lentils. Unsalted nuts and seeds. Unsalted canned beans.  Dairy Low-fat dairy products, such as skim or 1% milk, 2% or reduced-fat cheeses, low-fat ricotta or cottage cheese, or plain low-fat yogurt. Low-sodium or reduced-sodium cheeses.  Fats and Oils Tub margarines without trans fats. Light or reduced-fat mayonnaise and salad dressings (reduced sodium). Avocado. Safflower, olive, or canola oils. Natural peanut or almond butter.  Other Unsalted popcorn and pretzels. The items listed above may not be a complete list of recommended foods or beverages. Contact your dietitian for more options.  +++++++++++++++  WHAT FOODS ARE NOT RECOMMENDED? Grains/ White flour or wheat flour White bread. White pasta. White rice. Refined cornbread. Bagels and croissants. Crackers that contain trans fat.  Vegetables  Creamed or fried vegetables. Vegetables in a . Regular canned vegetables. Regular canned tomato sauce and paste. Regular tomato and vegetable juices.  Fruits Dried fruits. Canned fruit in light or heavy syrup. Fruit juice.  Meat and Other Protein Products Meat in general - RED meat & White meat.  Fatty cuts of meat. Ribs, chicken wings, all processed meats as bacon, sausage, bologna, salami, fatback, hot dogs, bratwurst and packaged luncheon meats.  Dairy Whole or 2% milk, cream, half-and-half, and cream cheese. Whole-fat or sweetened yogurt. Full-fat cheeses or blue cheese. Non-dairy creamers and whipped toppings. Processed cheese, cheese spreads, or cheese curds.  Condiments Onion and garlic salt, seasoned salt, table salt, and sea salt. Canned and packaged gravies.  Worcestershire sauce. Tartar sauce. Barbecue sauce. Teriyaki sauce. Soy sauce, including reduced sodium. Steak sauce. Fish sauce. Oyster sauce. Cocktail sauce. Horseradish. Ketchup and mustard. Meat flavorings and tenderizers. Bouillon cubes. Hot sauce. Tabasco sauce. Marinades. Taco seasonings. Relishes.  Fats and Oils Butter, stick margarine, lard, shortening and bacon fat. Coconut, palm kernel, or palm oils. Regular salad dressings.  Pickles and olives. Salted popcorn and pretzels.  The items listed above may not be a complete list of foods and beverages to avoid.

## 2018-02-22 ENCOUNTER — Ambulatory Visit (INDEPENDENT_AMBULATORY_CARE_PROVIDER_SITE_OTHER): Payer: Medicare Other | Admitting: Internal Medicine

## 2018-02-22 VITALS — BP 130/74 | HR 72 | Temp 97.6°F | Resp 16 | Ht 71.0 in | Wt 195.0 lb

## 2018-02-22 DIAGNOSIS — N184 Chronic kidney disease, stage 4 (severe): Secondary | ICD-10-CM | POA: Diagnosis not present

## 2018-02-22 DIAGNOSIS — I10 Essential (primary) hypertension: Secondary | ICD-10-CM | POA: Diagnosis not present

## 2018-02-22 DIAGNOSIS — E559 Vitamin D deficiency, unspecified: Secondary | ICD-10-CM | POA: Diagnosis not present

## 2018-02-22 DIAGNOSIS — Z951 Presence of aortocoronary bypass graft: Secondary | ICD-10-CM

## 2018-02-22 DIAGNOSIS — I480 Paroxysmal atrial fibrillation: Secondary | ICD-10-CM

## 2018-02-22 DIAGNOSIS — J984 Other disorders of lung: Secondary | ICD-10-CM

## 2018-02-22 DIAGNOSIS — N183 Chronic kidney disease, stage 3 unspecified: Secondary | ICD-10-CM

## 2018-02-22 DIAGNOSIS — E782 Mixed hyperlipidemia: Secondary | ICD-10-CM

## 2018-02-22 DIAGNOSIS — Z79899 Other long term (current) drug therapy: Secondary | ICD-10-CM

## 2018-02-22 DIAGNOSIS — Z7901 Long term (current) use of anticoagulants: Secondary | ICD-10-CM

## 2018-02-22 DIAGNOSIS — Z794 Long term (current) use of insulin: Secondary | ICD-10-CM

## 2018-02-22 DIAGNOSIS — E1122 Type 2 diabetes mellitus with diabetic chronic kidney disease: Secondary | ICD-10-CM

## 2018-02-23 LAB — COMPLETE METABOLIC PANEL WITH GFR
AG RATIO: 1.7 (calc) (ref 1.0–2.5)
ALT: 25 U/L (ref 9–46)
AST: 21 U/L (ref 10–35)
Albumin: 4.6 g/dL (ref 3.6–5.1)
Alkaline phosphatase (APISO): 122 U/L (ref 35–144)
BUN / CREAT RATIO: 20 (calc) (ref 6–22)
BUN: 39 mg/dL — ABNORMAL HIGH (ref 7–25)
CO2: 31 mmol/L (ref 20–32)
Calcium: 9.5 mg/dL (ref 8.6–10.3)
Chloride: 100 mmol/L (ref 98–110)
Creat: 1.96 mg/dL — ABNORMAL HIGH (ref 0.70–1.18)
GFR, Est African American: 37 mL/min/{1.73_m2} — ABNORMAL LOW (ref >=60)
GFR, Est Non African American: 32 mL/min/{1.73_m2} — ABNORMAL LOW (ref >=60)
Globulin: 2.7 g/dL (calc) (ref 1.9–3.7)
Glucose, Bld: 249 mg/dL — ABNORMAL HIGH (ref 65–99)
Potassium: 3.5 mmol/L (ref 3.5–5.3)
Sodium: 143 mmol/L (ref 135–146)
Total Bilirubin: 0.9 mg/dL (ref 0.2–1.2)
Total Protein: 7.3 g/dL (ref 6.1–8.1)

## 2018-02-23 LAB — PROTIME-INR
INR: 1.9 — ABNORMAL HIGH
Prothrombin Time: 19.4 s — ABNORMAL HIGH (ref 9.0–11.5)

## 2018-02-23 LAB — CBC WITH DIFFERENTIAL/PLATELET
Absolute Monocytes: 419 cells/uL (ref 200–950)
Basophils Absolute: 50 cells/uL (ref 0–200)
Basophils Relative: 0.7 %
EOS ABS: 78 {cells}/uL (ref 15–500)
Eosinophils Relative: 1.1 %
HCT: 41.1 % (ref 38.5–50.0)
Hemoglobin: 13.8 g/dL (ref 13.2–17.1)
Lymphs Abs: 667 cells/uL — ABNORMAL LOW (ref 850–3900)
MCH: 33.2 pg — ABNORMAL HIGH (ref 27.0–33.0)
MCHC: 33.6 g/dL (ref 32.0–36.0)
MCV: 98.8 fL (ref 80.0–100.0)
MPV: 9.3 fL (ref 7.5–12.5)
Monocytes Relative: 5.9 %
Neutro Abs: 5886 cells/uL (ref 1500–7800)
Neutrophils Relative %: 82.9 %
Platelets: 211 10*3/uL (ref 140–400)
RBC: 4.16 10*6/uL — AB (ref 4.20–5.80)
RDW: 13 % (ref 11.0–15.0)
Total Lymphocyte: 9.4 %
WBC: 7.1 10*3/uL (ref 3.8–10.8)

## 2018-02-23 LAB — PTH, INTACT AND CALCIUM
Calcium: 9.5 mg/dL (ref 8.6–10.3)
PTH: 88 pg/mL — ABNORMAL HIGH (ref 14–64)

## 2018-02-23 LAB — MAGNESIUM: Magnesium: 2.3 mg/dL (ref 1.5–2.5)

## 2018-02-23 LAB — LIPID PANEL
CHOL/HDL RATIO: 5.5 (calc) — AB (ref <5.0)
Cholesterol: 160 mg/dL (ref <200)
HDL: 29 mg/dL — ABNORMAL LOW (ref >=40)
LDL Cholesterol (Calc): 97 mg/dL (calc)
NON-HDL CHOLESTEROL (CALC): 131 mg/dL — AB (ref <130)
Triglycerides: 217 mg/dL — ABNORMAL HIGH (ref <150)

## 2018-02-23 LAB — VITAMIN D 25 HYDROXY (VIT D DEFICIENCY, FRACTURES): Vit D, 25-Hydroxy: 86 ng/mL (ref 30–100)

## 2018-02-23 LAB — HEMOGLOBIN A1C
Hgb A1c MFr Bld: 7.1 % of total Hgb — ABNORMAL HIGH (ref <5.7)
Mean Plasma Glucose: 157 (calc)
eAG (mmol/L): 8.7 (calc)

## 2018-02-23 LAB — TSH: TSH: 1.59 m[IU]/L (ref 0.40–4.50)

## 2018-02-24 DIAGNOSIS — D631 Anemia in chronic kidney disease: Secondary | ICD-10-CM | POA: Diagnosis not present

## 2018-02-24 DIAGNOSIS — E876 Hypokalemia: Secondary | ICD-10-CM | POA: Diagnosis not present

## 2018-02-24 DIAGNOSIS — N183 Chronic kidney disease, stage 3 (moderate): Secondary | ICD-10-CM | POA: Diagnosis not present

## 2018-02-24 DIAGNOSIS — N2581 Secondary hyperparathyroidism of renal origin: Secondary | ICD-10-CM | POA: Diagnosis not present

## 2018-02-24 DIAGNOSIS — I129 Hypertensive chronic kidney disease with stage 1 through stage 4 chronic kidney disease, or unspecified chronic kidney disease: Secondary | ICD-10-CM | POA: Diagnosis not present

## 2018-03-19 ENCOUNTER — Other Ambulatory Visit (HOSPITAL_COMMUNITY): Payer: Self-pay | Admitting: Internal Medicine

## 2018-03-22 DIAGNOSIS — N2581 Secondary hyperparathyroidism of renal origin: Secondary | ICD-10-CM | POA: Insufficient documentation

## 2018-03-22 NOTE — Progress Notes (Signed)
Assessment and Plan:  PAF (paroxysmal atrial fibrillation) (HCC) Check INR and will adjust medication according to labs.  Discussed if patient falls to immediately contact office or go to ER. Discussed foods that can increase or decrease Coumadin levels. Patient understands to call the office before starting a new medication. Follow up in one month.   Chronic diastolic congestive heart failure (HCC) Weights up, mild exertional fatigue, take PRN metolazone x 1 dose and evaluate weight progress, goal to get 183-184 range; repeat metolazone in 1 week if needed, call if not improving  Emphasized salt restriction, less than 2065m a day. Encouraged daily monitoring of the patient's weight, call office if 5 lb weight loss or gain in a day.  Encouraged regular exercise. If any increasing shortness of breath, swelling, or chest pressure go to ER immediately.  decrease your fluid intake to less than 1.5 L daily please remember to always increase your potassium intake with any increase of your fluid pill.   Hypertension At goal; continue medications Monitor blood pressure at home; call if consistently over 130/80 Continue DASH diet.   Reminder to go to the ER if any CP, SOB, nausea, dizziness, severe HA, changes vision/speech, left arm numbness and tingling and jaw pain.  CKD (chronic kidney disease) stage 3, GFR 30-59 ml/min (HCC) Increase fluids, avoid NSAIDS, monitor sugars, will monitor BMP/GFR   Further disposition pending results of labs. Discussed med's effects and SE's.   Over 30 minutes of exam, counseling, chart review, and critical decision making was performed.   Future Appointments  Date Time Provider [place] 04/25/2018 11:15 AM CVicie Mutters PA-C [facility] None  05/19/2018 12:45 PM MHayden Pedro MD [facility] None  06/07/2018  3:00 PM MUnk Pinto MD [facility] None  09/19/2018  3:00 PM CLiane Comber NP [facility] None     ------------------------------------------------------------------------------------------------------------------   HPI BP 110/64   Pulse 65   Temp (!) 97.5 F (36.4 C)   Ht 5' 11"  (1.803 m)   Wt 198 lb (89.8 kg)   SpO2 97%   BMI 27.62 kg/m   78 y.o.male presents for follow up on coumadin for p. A. fib, CHF, htn, CKD III/IV.  His blood pressure has been controlled at home, today their BP is BP: 110/64  He does not workout. He denies chest pain, shortness of breath, dizziness.  He has a history of Diastolic CHF, denies dyspnea on exertion, orthopnea, paroxysmal nocturnal dyspnea and edema. Positive for scant edema. He reports home weight today was 190 lb; per Dr. BMissy Sabinsgoal weight is 182-183lb. He is taking torsemide 100 mg in AM and  Wt Readings from Last 3 Encounters:  03/23/18 198 lb (89.8 kg)  02/22/18 195 lb (88.5 kg)  02/17/18 192 lb (87.1 kg)   Patient is on Coumadin for PAF ([place]ox[place]mal atrial fibrillation) [place] [I48.0[place] Patient's last INR is  Lab Results  Component Value Date   INR 1.9 (H) 02/22/2018   INR 2.6 (H) 01/17/2018   INR 2.7 (H) 12/13/2017    Patient denies SOB, CP, dizziness, and blood in stool/urine.  His coumadin dose was changed last visit. He has not taken ABX, has not missed any doses and denies a fall.    Current dose: 2 tabs daily   (Previously on Coumadin 5 mg x 2 tabs - 6 days and 1 tab x 1 day)    Past Medical History:  Diagnosis Date  . Adrenal adenoma   . Anemia   . Anxiety   .  Arthritis   . Atypical atrial flutter ([place]) 8/15, 10/15   a. DCCV 08/2013. b. s/p RFA 10/2013.  [name] Basal cell carcinoma   . CAD (coronary artery disease)    a. 04/2013 CABG x 2: LIMA to LAD, SVG to RI, EVH via R thigh.  . Cellulitis 12/2015   left leg  . Chronic diastolic congestive heart failure ([place])   . CKD (chronic kidney disease), stage III ([place])   . Depression   . Diabetes mellitus type II   . Diverticulosis 2001  . DJD (degenerative joint  disease)   . GERD (gastroesophageal reflux disease)   . Gout   . H/O hiatal hernia   . History of cardioversion    x3 (years uncertain)  . Hx of adenomatous colonic polyps   . Hyperlipidemia   . Hypertension   . Hypertensive cardiomyopathy ([place])   . Obstructive sleep apnea    compliant with CPAP  . 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 Persistent atrial fibrillation    a. s/p MAZE 04/2013 in setting of CABG. b. Amio stopped in 10/2013 after flutter ablation.  [name] PFO (patent foramen ovale)    a. Small PFO by TEE 10/2013.  [name] Pleural effusion, left    a. s/p thoracentesis 05/2013.  [name] Respiratory failure ([place])    a. Hypoxia 10/2013 - required supp O2 as inpatient, did not require it at discharge.  . S/P Maze operation for atrial fibrillation    a. 04/2013: Complete bilateral atrial lesion set using cryothermy and bipolar radiofrequency ablation with clipping of LA appendage (@ time of CABG)  . S/P Maze operation for atrial fibrillation 04/05/2013   Complete bilateral atrial lesion set using cryothermy and bipolar radiofrequency ablation with clipping of LA appendage via median sternotomy approach      Allergies  Allergen Reactions  . Sunflower Seed [Sunflower Oil] Swelling and Other (See Comments)    Tongue and lip swelling  . Horse-Derived Products Other (See Comments)    Per allergy skin test UNSPECIFIED REACTION   . Tetanus Toxoids Other (See Comments)    Per allergy skin test  . Tetanus Toxoid     Other reaction(s): Other (See Comments) Rash(horse serum)    Current Outpatient Medications on File Prior to Visit  Medication Sig  . ACCU-CHEK AVIVA PLUS test strip CHECK BLOOD GLUCOSE 3 TIMES DAILY.  [name] acetaminophen (TYLENOL) 500 MG tablet Take 1,000 mg by mouth every 6 (six) hours as needed for moderate pain or headache.   . allopurinol (ZYLOPRIM) 300 MG tablet Take 1  tablet daily to prevent gout (Patient taking differently: Take 300 mg by mouth daily. )  . ALPRAZolam (XANAX) 1 MG tablet Take 0.5 tablets (0.5 mg total) by mouth at bedtime as needed for sleep.  [name] Alum Hydroxide-Mag Carbonate (GAVISCON PO) Take 4 tablets by mouth daily as needed (acid reflux).   [name] aspirin EC 81 MG tablet Take 81 mg by mouth daily.  [name] atorvastatin (LIPITOR) 40 MG tablet Take 20 mg by mouth every Monday, Wednesday, and Friday.   [name] azelastine (ASTELIN) 0.1 % nasal spray PLACE 2 SPRAYS INTO BOTH NOSTRILS 2 (TWO) TIMES DAILY. USE IN EACH NOSTRIL AS DIRECTED  . B Complex Vitamins (VITAMIN B COMPLEX PO) Take 1 tablet by mouth at bedtime.   . bacitracin 500 UNIT/GM ointment Apply 1 application topically daily as needed for wound care.  . benzonatate (  TESSALON) 200 MG capsule Take 1 capsule (200 mg total) by mouth 3 (three) times daily as needed for cough (Max: 655m per day).  . Blood Glucose Monitoring Suppl (ACCU-CHEK AVIVA PLUS) w/Device KIT Check blood sugar 1 time  daily  . Blood Glucose Monitoring Suppl DEVI Test blood sugar up to three times a day or as directed.  . carvedilol (COREG) 6.25 MG tablet Take 1 tablet (6.25 mg total) by mouth 3 (three) times daily.  . Cholecalciferol (VITAMIN D PO) Take 5,000 Units by mouth 2 (two) times daily.  . colchicine 0.6 MG tablet Take 0.6 mg by mouth daily as needed (for gout flare up).   . diphenhydrAMINE (BENADRYL) 25 MG tablet Take 25 mg by mouth at bedtime as needed for allergies.   . ferrous sulfate 325 (65 FE) MG tablet Take 325 mg by mouth 2 (two) times daily with a meal.  . fluticasone (FLONASE) 50 MCG/ACT nasal spray Place 1 spray into both nostrils daily as needed for allergies.  .Marland KitchenHYDROcodone-acetaminophen (NORCO/VICODIN) 5-325 MG tablet Take 1-2 tablets by mouth every 6 (six) hours as needed for moderate pain.   .Marland Kitcheninsulin NPH-regular Human (NOVOLIN 70/30) (70-30) 100 UNIT/ML injection Inject 25 Units into the skin daily. 15 units  AM, 10 units PM  . lidocaine (LIDODERM) 5 % Place 1 patch onto the skin daily as needed (for pain). Remove & Discard patch within 12 hours or as directed by MD   . loratadine (CLARITIN) 10 MG tablet Take 10 mg by mouth daily as needed for allergies.   . metolazone (ZAROXOLYN) 5 MG tablet TAKE 1 TABLET BY MOUTH  DAILY AS NEEDED (WEIGHT  GAIN AND EDEMA)  . Multiple Vitamin (MULTIVITAMIN WITH MINERALS) TABS tablet Take 1 tablet by mouth at bedtime.   . Multiple Vitamins-Minerals (PRESERVISION AREDS 2) CAPS Take 1 capsule by mouth 2 (two) times daily.   . polyvinyl alcohol (ARTIFICIAL TEARS) 1.4 % ophthalmic solution Place 1 drop into both eyes daily as needed for dry eyes.  . potassium chloride SA (K-DUR,KLOR-CON) 20 MEQ tablet Take 20 mEq by mouth 4 (four) times daily.   .Marland KitchenPRESCRIPTION MEDICATION Inhale into the lungs at bedtime. CPAP  . sertraline (ZOLOFT) 100 MG tablet TAKE 1 TABLET BY MOUTH  DAILY  . torsemide (DEMADEX) 100 MG tablet TAKE 1 TABLET BY MOUTH IN  THE MORNING AND 1/2 TABLET  IN THE EVENING IF NEEDED  FOR WEIGHT GAIN  . warfarin (COUMADIN) 5 MG tablet Take 1 to 2 tablets daily as directed   No current facility-administered medications on file prior to visit.     ROS: Review of Systems  Constitutional: Negative for malaise/fatigue and weight loss.  HENT: Positive for congestion. Negative for hearing loss and tinnitus.   Eyes: Negative for blurred vision and double vision.  Respiratory: Negative for cough, sputum production, shortness of breath and wheezing.   Cardiovascular: Negative for chest pain, palpitations, orthopnea, claudication, leg swelling and PND.       Some exertional fatigue, scant edema, does have gradual 5 lb weight gain  Gastrointestinal: Negative for abdominal pain, blood in stool, constipation, diarrhea, heartburn, melena, nausea and vomiting.  Genitourinary: Negative.   Musculoskeletal: Negative for joint pain and myalgias.  Skin: Negative for rash.   Neurological: Negative for dizziness, tingling, sensory change, weakness and headaches.  Endo/Heme/Allergies: Negative for polydipsia. Bruises/bleeds easily.  Psychiatric/Behavioral: Negative.   All other systems reviewed and are negative.    Physical Exam:  BP 110/64  Pulse 65   Temp (!) 97.5 F (36.4 C)   Ht 5' 11"  (1.803 m)   Wt 198 lb (89.8 kg)   SpO2 97%   BMI 27.62 kg/m   General Appearance: Well nourished, in no apparent distress. Eyes: PERRLA, EOMs, conjunctiva no swelling or erythema Sinuses: No Frontal/maxillary tenderness ENT/Mouth: Ext aud canals clear, TMs without erythema, bulging. No erythema, swelling, or exudate on post pharynx.  Tonsils not swollen or erythematous. Hearing normal. Nasal mucus membranes intact, no polypes of blood in nares/turbinates Neck: Supple, thyroid normal.  Respiratory: Respiratory effort normal, BS equal bilaterally without rales, rhonchi, wheezing or stridor.  Cardio: RRR with no MRGs. Brisk peripheral pulses with scant non-pitting edema   Abdomen: Soft, obese/mildly distended, + BS.  Non tender, no guarding, rebound, hernias, masses. Lymphatics: Non tender without lymphadenopathy.  Musculoskeletal: Full ROM, 5/5 strength, normal gait.  Skin: Warm, dry without rashes, lesions, ecchymosis.  Neuro: Cranial nerves intact. Normal muscle tone, no cerebellar symptoms. Sensation intact.  Psych: Awake and oriented X 3, normal affect, Insight and Judgment appropriate.     Izora Ribas, NP 10:58 AM [facility]

## 2018-03-23 ENCOUNTER — Other Ambulatory Visit: Payer: Self-pay

## 2018-03-23 ENCOUNTER — Encounter: Payer: Self-pay | Admitting: Adult Health

## 2018-03-23 ENCOUNTER — Ambulatory Visit (INDEPENDENT_AMBULATORY_CARE_PROVIDER_SITE_OTHER): Payer: Medicare Other | Admitting: Adult Health

## 2018-03-23 ENCOUNTER — Other Ambulatory Visit: Payer: Self-pay | Admitting: Adult Health

## 2018-03-23 VITALS — BP 110/64 | HR 65 | Temp 97.5°F | Ht 71.0 in | Wt 198.0 lb

## 2018-03-23 DIAGNOSIS — E1122 Type 2 diabetes mellitus with diabetic chronic kidney disease: Secondary | ICD-10-CM

## 2018-03-23 DIAGNOSIS — I10 Essential (primary) hypertension: Secondary | ICD-10-CM | POA: Diagnosis not present

## 2018-03-23 DIAGNOSIS — N183 Chronic kidney disease, stage 3 (moderate): Secondary | ICD-10-CM | POA: Diagnosis not present

## 2018-03-23 DIAGNOSIS — I5032 Chronic diastolic (congestive) heart failure: Secondary | ICD-10-CM

## 2018-03-23 DIAGNOSIS — I480 Paroxysmal atrial fibrillation: Secondary | ICD-10-CM | POA: Diagnosis not present

## 2018-03-23 DIAGNOSIS — N2581 Secondary hyperparathyroidism of renal origin: Secondary | ICD-10-CM

## 2018-03-23 DIAGNOSIS — Z79899 Other long term (current) drug therapy: Secondary | ICD-10-CM

## 2018-03-23 MED ORDER — INSULIN NPH ISOPHANE & REGULAR (70-30) 100 UNIT/ML ~~LOC~~ SUSP: 25.0000 [IU] | Freq: Every day | SUBCUTANEOUS | 2 refills | Status: DC

## 2018-03-23 NOTE — Patient Instructions (Signed)
Goals    . Blood Pressure < 140/90    . HEMOGLOBIN A1C < 8.0    . LDL CALC < 70    . Record weight daily     Maintain weight at 183-4lb range for heart      Take metolazone today or tomorrow; goal to get weight < 184lb   Do the following things EVERYDAY: 1) Weigh yourself in the morning before breakfast or at the same time every day. Write it down and keep it in a log. 2) Take your medicines as prescribed 3) Eat low salt foods-Limit salt (sodium) to 2000 mg per day. Best thing to do is avoid processed foods.   4) Stay as active as you can everyday 5) Limit all fluids for the day to less than 1.5 liters  Call your doctor if:  Anytime you have any of the following symptoms:  1) 2 pound weight gain in 24 hours or 5 pounds in 1 week  2) shortness of breath, with or without a dry hacking cough  3) swelling in the hands, LEGs, feet or stomach  4) if you have to sleep on extra pillows at night in order to breathe. 5) after laying down at night for 20-30 mins, you wake up short of breath.   These can all be signs of fluid overload.     Due to coronavirus, we are doing telephone/evisit treatment for many respiratory symptoms to limit cross contamination at the office. Give [place] a call if you have any symptoms and are concerned.   Coronavirus (COVID-19) Are you at risk?  Are you at risk for the Coronavirus (COVID-19)?  To be considered HIGH RISK for Coronavirus (COVID-19), you have to meet the following criteria:  . Traveled to [place], [place], [place], [place] or [place]; or in the [place] to [place], [place], [place], or [place]; and have fever, cough, and shortness of breath within the last 2 weeks of travel OR . Been in close contact with a person diagnosed with COVID-19 within the last 2 weeks and have fever, cough, and shortness of breath . IF YOU DO NOT MEET THESE CRITERIA, YOU ARE CONSIDERED LOW RISK FOR COVID-19.  What to do if you are HIGH RISK for COVID-19?   [name] If you are having a medical emergency, call 911. . Seek medical care right away. Before you go to a doctor's office, urgent care or emergency department, call ahead and tell them about your recent travel, contact with someone diagnosed with COVID-19, and your symptoms. You should receive instructions from your physician's office regarding next steps of care.  . When you arrive at healthcare provider, tell the healthcare staff immediately you have returned from visiting [place], [place], [place], [place] or [place]; or traveled in the [place] to [place], [place], [place], or [place]; in the last two weeks or you have been in close contact with a person diagnosed with COVID-19 in the last 2 weeks.   . Tell the health care staff about your symptoms: fever, cough and shortness of breath. . After you have been seen by a medical provider, you will be either: o Tested for (COVID-19) and discharged home on quarantine except to seek medical care if symptoms worsen, and asked to  - Stay home and avoid contact with others until you get your results (4-5 days)  - Avoid travel on public transportation if possible (such as bus, train, or airplane) or o Sent to the  Emergency Department by EMS for evaluation, COVID-19 testing, and possible admission depending on your condition and test results.  What to do if you are LOW RISK for COVID-19?  Reduce your risk of any infection by using the same precautions used for avoiding the common cold or flu:  [name] Wash your hands often with soap and warm water for at least 20 seconds.  If soap and water are not readily available, use an alcohol-based hand sanitizer with at least 60% alcohol.  . If coughing or sneezing, cover your mouth and nose by coughing or sneezing into the elbow areas of your shirt or coat, into a tissue or into your sleeve (not your hands). . Avoid shaking hands with others and consider head nods or verbal greetings only. . Avoid touching your  eyes, nose, or mouth with unwashed hands.  . Avoid close contact with people who are sick. . Avoid places or events with large numbers of people in one location, like concerts or sporting events. . Carefully consider travel plans you have or are making. . If you are planning any travel outside or inside the [place], visit the CDC's Travelers' Health webpage for the latest health notices. . If you have some symptoms but not all symptoms, continue to monitor at home and seek medical attention if your symptoms worsen. . If you are having a medical emergency, call 911.        Person Under Monitoring Name: [name].  Location: [address] [place] [address]   Infection Prevention Recommendations for Individuals Confirmed to have, or Being Evaluated for, 2019 Novel Coronavirus (COVID-19) Infection Who Receive Care at Home  Individuals who are confirmed to have, or are being evaluated for, COVID-19 should follow the prevention steps below until a healthcare provider or local or state health department says they can return to normal activities.  Stay home except to get medical care You should restrict activities outside your home, except for getting medical care. Do not go to work, school, or public areas, and do not use public transportation or taxis.  Call ahead before visiting your doctor Before your medical appointment, call the healthcare provider and tell them that you have, or are being evaluated for, COVID-19 infection. This will help the healthcare provider's office take steps to keep other people from getting infected. Ask your healthcare provider to call the local or state health department.  Monitor your symptoms Seek prompt medical attention if your illness is worsening (e.g., difficulty breathing). Before going to your medical appointment, call the healthcare provider and tell them that you have, or are being evaluated for, COVID-19 infection. Ask your healthcare  provider to call the local or state health department.  Wear a facemask You should wear a facemask that covers your nose and mouth when you are in the same room with other people and when you visit a healthcare provider. People who live with or visit you should also wear a facemask while they are in the same room with you.  Separate yourself from other people in your home As much as possible, you should stay in a different room from other people in your home. Also, you should use a separate bathroom, if available.  Avoid sharing household items You should not share dishes, drinking glasses, cups, eating utensils, towels, bedding, or other items with other people in your home. After using these items, you should wash them thoroughly with soap and water.  Cover your coughs and sneezes Cover  your mouth and nose with a tissue when you cough or sneeze, or you can cough or sneeze into your sleeve. Throw used tissues in a lined trash can, and immediately wash your hands with soap and water for at least 20 seconds or use an alcohol-based hand rub.  Wash your [facility] your hands often and thoroughly with soap and water for at least 20 seconds. You can use an alcohol-based hand sanitizer if soap and water are not available and if your hands are not visibly dirty. Avoid touching your eyes, nose, and mouth with unwashed hands.     Prevention Steps for Caregivers and Household Members of Individuals Confirmed to have, or Being Evaluated for, COVID-19 Infection Being Cared for in the Home  If you live with, or provide care at home for, a person confirmed to have, or being evaluated for, COVID-19 infection please follow these guidelines to prevent infection:  Follow healthcare provider's instructions Make sure that you understand and can help the patient follow any healthcare provider instructions for all care.  Provide for the patient's basic needs You should help the patient with basic  needs in the home and provide support for getting groceries, prescriptions, and other personal needs.  Monitor the patient's symptoms If they are getting sicker, call his or her medical provider and tell them that the patient has, or is being evaluated for, COVID-19 infection. This will help the healthcare provider's office take steps to keep other people from getting infected. Ask the healthcare provider to call the local or state health department.  Limit the number of people who have contact with the patient  If possible, have only one caregiver for the patient.  Other household members should stay in another home or place of residence. If this is not possible, they should stay  in another room, or be separated from the patient as much as possible. Use a separate bathroom, if available.  Restrict visitors who do not have an essential need to be in the home.  Keep older adults, very young children, and other sick people away from the patient Keep older adults, very young children, and those who have compromised immune systems or chronic health conditions away from the patient. This includes people with chronic heart, lung, or kidney conditions, diabetes, and cancer.  Ensure good ventilation Make sure that shared spaces in the home have good air flow, such as from an air conditioner or an opened window, weather permitting.  Wash your hands often  Wash your hands often and thoroughly with soap and water for at least 20 seconds. You can use an alcohol based hand sanitizer if soap and water are not available and if your hands are not visibly dirty.  Avoid touching your eyes, nose, and mouth with unwashed hands.  Use disposable paper towels to dry your hands. If not available, use dedicated cloth towels and replace them when they become wet.  Wear a facemask and gloves  Wear a disposable facemask at all times in the room and gloves when you touch or have contact with the patient's  blood, body fluids, and/or secretions or excretions, such as sweat, saliva, sputum, nasal mucus, vomit, urine, or feces.  Ensure the mask fits over your nose and mouth tightly, and do not touch it during use.  Throw out disposable facemasks and gloves after using them. Do not reuse.  Wash your hands immediately after removing your facemask and gloves.  If your personal clothing becomes contaminated, carefully remove clothing  and launder. Wash your hands after handling contaminated clothing.  Place all used disposable facemasks, gloves, and other waste in a lined container before disposing them with other household waste.  Remove gloves and wash your hands immediately after handling these items.  Do not share dishes, glasses, or other household items with the patient  Avoid sharing household items. You should not share dishes, drinking glasses, cups, eating utensils, towels, bedding, or other items with a patient who is confirmed to have, or being evaluated for, COVID-19 infection.  After the person uses these items, you should wash them thoroughly with soap and water.  Wash laundry thoroughly  Immediately remove and wash clothes or bedding that have blood, body fluids, and/or secretions or excretions, such as sweat, saliva, sputum, nasal mucus, vomit, urine, or feces, on them.  Wear gloves when handling laundry from the patient.  Read and follow directions on labels of laundry or clothing items and detergent. In general, wash and dry with the warmest temperatures recommended on the label.  Clean all areas the individual has used often  Clean all touchable surfaces, such as counters, tabletops, doorknobs, bathroom fixtures, toilets, phones, keyboards, tablets, and bedside tables, every day. Also, clean any surfaces that may have blood, body fluids, and/or secretions or excretions on them.  Wear gloves when cleaning surfaces the patient has come in contact with.  Use a diluted bleach  solution (e.g., dilute bleach with 1 part bleach and 10 parts water) or a household disinfectant with a label that says EPA-registered for coronaviruses. To make a bleach solution at home, add 1 tablespoon of bleach to 1 quart (4 cups) of water. For a larger supply, add  cup of bleach to 1 gallon (16 cups) of water.  Read labels of cleaning products and follow recommendations provided on product labels. Labels contain instructions for safe and effective use of the cleaning product including precautions you should take when applying the product, such as wearing gloves or eye protection and making sure you have good ventilation during use of the product.  Remove gloves and wash hands immediately after cleaning.  Monitor yourself for signs and symptoms of illness Caregivers and household members are considered close contacts, should monitor their health, and will be asked to limit movement outside of the home to the extent possible. Follow the monitoring steps for close contacts listed on the symptom monitoring form.   ? If you have additional questions, contact your local health department or call the epidemiologist on call at [phone] (available 24/7). ? This guidance is subject to change. For the most up-to-date guidance from [facility], please refer to their website: [email or web address]

## 2018-03-24 LAB — BASIC METABOLIC PANEL WITH GFR
BUN/Creatinine Ratio: 17 (calc) (ref 6–22)
BUN: 36 mg/dL — ABNORMAL HIGH (ref 7–25)
CO2: 32 mmol/L (ref 20–32)
Calcium: 8.7 mg/dL (ref 8.6–10.3)
Chloride: 105 mmol/L (ref 98–110)
Creat: 2.11 mg/dL — ABNORMAL HIGH (ref 0.70–1.18)
GFR, Est African American: 34 mL/min/{1.73_m2} — ABNORMAL LOW (ref >=60)
GFR, Est Non African American: 29 mL/min/{1.73_m2} — ABNORMAL LOW (ref >=60)
Glucose, Bld: 128 mg/dL — ABNORMAL HIGH (ref 65–99)
Potassium: 4.1 mmol/L (ref 3.5–5.3)
SODIUM: 144 mmol/L (ref 135–146)

## 2018-03-24 LAB — CBC WITH DIFFERENTIAL/PLATELET
Absolute Monocytes: 532 cells/uL (ref 200–950)
Basophils Absolute: 53 cells/uL (ref 0–200)
Basophils Relative: 0.7 %
Eosinophils Absolute: 144 cells/uL (ref 15–500)
Eosinophils Relative: 1.9 %
HCT: 36.8 % — ABNORMAL LOW (ref 38.5–50.0)
Hemoglobin: 12.4 g/dL — ABNORMAL LOW (ref 13.2–17.1)
Lymphs Abs: 836 cells/uL — ABNORMAL LOW (ref 850–3900)
MCH: 32.5 pg (ref 27.0–33.0)
MCHC: 33.7 g/dL (ref 32.0–36.0)
MCV: 96.6 fL (ref 80.0–100.0)
MPV: 9.2 fL (ref 7.5–12.5)
Monocytes Relative: 7 %
Neutro Abs: 6034 cells/uL (ref 1500–7800)
Neutrophils Relative %: 79.4 %
Platelets: 182 10*3/uL (ref 140–400)
RBC: 3.81 10*6/uL — ABNORMAL LOW (ref 4.20–5.80)
RDW: 13.1 % (ref 11.0–15.0)
Total Lymphocyte: 11 %
WBC: 7.6 10*3/uL (ref 3.8–10.8)

## 2018-03-24 LAB — PROTIME-INR
INR: 3.3 — ABNORMAL HIGH
Prothrombin Time: 31.9 s — ABNORMAL HIGH (ref 9.0–11.5)

## 2018-04-19 ENCOUNTER — Other Ambulatory Visit: Payer: Medicare Other

## 2018-04-19 ENCOUNTER — Other Ambulatory Visit: Payer: Self-pay

## 2018-04-19 DIAGNOSIS — N183 Chronic kidney disease, stage 3 (moderate): Secondary | ICD-10-CM | POA: Diagnosis not present

## 2018-04-19 DIAGNOSIS — E1122 Type 2 diabetes mellitus with diabetic chronic kidney disease: Secondary | ICD-10-CM

## 2018-04-19 DIAGNOSIS — I480 Paroxysmal atrial fibrillation: Secondary | ICD-10-CM

## 2018-04-19 DIAGNOSIS — Z7901 Long term (current) use of anticoagulants: Secondary | ICD-10-CM

## 2018-04-20 LAB — COMPLETE METABOLIC PANEL WITH GFR
AG Ratio: 1.5 (calc) (ref 1.0–2.5)
ALT: 17 U/L (ref 9–46)
AST: 21 U/L (ref 10–35)
Albumin: 4.3 g/dL (ref 3.6–5.1)
Alkaline phosphatase (APISO): 100 U/L (ref 35–144)
BUN/Creatinine Ratio: 16 (calc) (ref 6–22)
BUN: 32 mg/dL — ABNORMAL HIGH (ref 7–25)
CO2: 34 mmol/L — ABNORMAL HIGH (ref 20–32)
Calcium: 9.1 mg/dL (ref 8.6–10.3)
Chloride: 102 mmol/L (ref 98–110)
Creat: 1.97 mg/dL — ABNORMAL HIGH (ref 0.70–1.18)
GFR, Est African American: 36 mL/min/{1.73_m2} — ABNORMAL LOW (ref >=60)
GFR, Est Non African American: 31 mL/min/{1.73_m2} — ABNORMAL LOW (ref >=60)
Globulin: 2.9 g/dL (calc) (ref 1.9–3.7)
Glucose, Bld: 110 mg/dL — ABNORMAL HIGH (ref 65–99)
Potassium: 3.6 mmol/L (ref 3.5–5.3)
Sodium: 145 mmol/L (ref 135–146)
Total Bilirubin: 0.8 mg/dL (ref 0.2–1.2)
Total Protein: 7.2 g/dL (ref 6.1–8.1)

## 2018-04-20 LAB — CBC WITH DIFFERENTIAL/PLATELET
Absolute Monocytes: 540 cells/uL (ref 200–950)
Basophils Absolute: 30 cells/uL (ref 0–200)
Basophils Relative: 0.4 %
Eosinophils Absolute: 141 cells/uL (ref 15–500)
Eosinophils Relative: 1.9 %
HCT: 39.4 % (ref 38.5–50.0)
Hemoglobin: 13.3 g/dL (ref 13.2–17.1)
Lymphs Abs: 718 cells/uL — ABNORMAL LOW (ref 850–3900)
MCH: 32.4 pg (ref 27.0–33.0)
MCHC: 33.8 g/dL (ref 32.0–36.0)
MCV: 95.9 fL (ref 80.0–100.0)
MPV: 9 fL (ref 7.5–12.5)
Monocytes Relative: 7.3 %
Neutro Abs: 5972 cells/uL (ref 1500–7800)
Neutrophils Relative %: 80.7 %
Platelets: 200 10*3/uL (ref 140–400)
RBC: 4.11 10*6/uL — ABNORMAL LOW (ref 4.20–5.80)
RDW: 13.4 % (ref 11.0–15.0)
Total Lymphocyte: 9.7 %
WBC: 7.4 10*3/uL (ref 3.8–10.8)

## 2018-04-20 LAB — PROTIME-INR
INR: 2.7 — ABNORMAL HIGH
Prothrombin Time: 26 s — ABNORMAL HIGH (ref 9.0–11.5)

## 2018-04-21 NOTE — Progress Notes (Signed)
THIS ENCOUNTER IS A VIRTUAL/TELEPHONE VISIT DUE TO COVID-19 - PATIENT WAS NOT SEEN IN THE OFFICE.  PATIENT HAS CONSENTED TO VIRTUAL VISIT / TELEMEDICINE VISIT  This provider placed a call to [name]. using telephone, his appointment was changed to a virtual office visit to reduce the risk of exposure to the COVID-19 virus and to help [name]. remain healthy and safe. The virtual visit will also provide continuity of care. He verbalizes understanding.    Assessment and Plan:  PAF (paroxysmal atrial fibrillation) (HCC) Check INR and will adjust medication according to labs.  Discussed if patient falls to immediately contact office or go to ER. Discussed foods that can increase or decrease Coumadin levels. Patient understands to call the office before starting a new medication. Follow up June for appointment scheduled  Chronic diastolic congestive heart failure ([place]) Emphasized salt restriction, less than 2052m a day. Encouraged daily monitoring of the patient's weight, call office if 5 lb weight loss or gain in a day.  Encouraged regular exercise. If any increasing shortness of breath, swelling, or chest pressure go to ER immediately.  decrease your fluid intake to less than 1.5 L daily please remember to always increase your potassium intake with any increase of your fluid pill.   Hypertension At goal; continue medications Monitor blood pressure at home; call if consistently over 130/80 Continue DASH diet.   Reminder to go to the ER if any CP, SOB, nausea, dizziness, severe HA, changes vision/speech, left arm numbness and tingling and jaw pain.  CKD (chronic kidney disease) stage 3, GFR 30-59 ml/min (HCC) Increase fluids, avoid NSAIDS, monitor sugars, will monitor BMP/GFR  Type 2 diabetes mellitus with stage 4 chronic kidney disease, with long-term current use of insulin ([place] Patient having some low sugars at night without eating Will cut night time sugar from 45 to  42 and further down to 40 Discussed hypoglycemia and danger of low sugars Patient understands, will monitor closely.   Further disposition pending results of labs. Discussed med's effects and SE's.   Over 30 minutes of exam, counseling, chart review, and critical decision making was performed.   Future Appointments  Date Time Provider DWasta 05/19/2018 12:45 PM MHayden Pedro MD [facility] None  06/07/2018  3:00 PM MUnk Pinto MD [facility] None  09/19/2018  3:00 PM CLiane Comber NP [facility] None    ------------------------------------------------------------------------------------------------------------------   HPI BP (!) 134/103   Ht 5' 11"  (1.803 m)   Wt 190 lb 3.2 oz (86.3 kg)   BMI 26.53 kg/m   78 y.o.male presents for follow up on coumadin for p. A. fib, CHF, htn, CKD III/IV.  His blood pressure has been controlled at home, today their BP is BP: (!) 134/103 Has been 120's/70's today it was abnormal.   He does not workout. He denies chest pain, shortness of breath, dizziness.  He has a history of Diastolic CHF, denies dyspnea on exertion, orthopnea, paroxysmal nocturnal dyspnea and edema.  per Dr. BMissy Sabinsgoal weight is 182-183lb. He is taking torsemide 100 mg in AM and 1/2 in the PM, he will still take a metolazone at least once a week to keep his weight down. [facility]  Wt Readings from Last 3 Encounters:  04/25/18 190 lb 3.2 oz (86.3 kg)  03/23/18 198 lb (89.8 kg)  02/22/18 195 lb (88.5 kg)   Patient is on Coumadin for PAF (paroxysmal atrial fibrillation) (HCC) [  I48.0] Patient's last INR is  Lab Results  Component Value Date   INR 2.7 (H) 04/19/2018   INR 3.3 (H) 03/23/2018   INR 1.9 (H) 02/22/2018    Patient denies SOB, CP, dizziness, and blood in stool/urine.  His coumadin dose was changed last visit. He has not taken ABX, has not missed any doses and denies a fall.    Current dose: 2 tabs daily    He states he is on 50 units of 70/30 in the AM and 45 at night, will wake up staggering and sweaty if he does not drink juice and eat crackers some nights. No other low sugars.    Past Medical History:  Diagnosis Date  . Adrenal adenoma   . Anemia   . Anxiety   . Arthritis   . 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 DCCV 08/2013. b. s/p RFA 10/2013.  [name] Basal cell carcinoma   . CAD (coronary artery disease)    a. 04/2013 CABG x 2: LIMA to LAD, SVG to RI, EVH via R thigh.  . Cellulitis 12/2015   left leg  . Chronic diastolic congestive heart failure ([place])   . CKD (chronic kidney disease), stage III ([place])   . Depression   . Diabetes mellitus type II   . Diverticulosis 2001  . DJD (degenerative joint disease)   . GERD (gastroesophageal reflux disease)   . Gout   . H/O hiatal hernia   . History of cardioversion    x3 (years uncertain)  . Hx of adenomatous colonic polyps   . Hyperlipidemia   . Hypertension   . Hypertensive cardiomyopathy ([place])   . Obstructive sleep apnea    compliant with CPAP  . 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 Persistent atrial fibrillation    a. s/p MAZE 04/2013 in setting of CABG. b. Amio stopped in 10/2013 after flutter ablation.  [name] PFO (patent foramen ovale)    a. Small PFO by TEE 10/2013.  [name] Pleural effusion, left    a. s/p thoracentesis 05/2013.  [name] Respiratory failure ([place])    a. Hypoxia 10/2013 - required supp O2 as inpatient, did not require it at discharge.  . S/P Maze operation for atrial fibrillation    a. 04/2013: Complete bilateral atrial lesion set using cryothermy and bipolar radiofrequency ablation with clipping of LA appendage (@ time of CABG)  . S/P Maze operation for atrial fibrillation 04/05/2013   Complete bilateral atrial lesion set using cryothermy and bipolar radiofrequency ablation with clipping of LA appendage via  median sternotomy approach      Allergies  Allergen Reactions  . Sunflower Seed [Sunflower Oil] Swelling and Other (See Comments)    Tongue and lip swelling  . Horse-Derived Products Other (See Comments)    Per allergy skin test UNSPECIFIED REACTION   . Tetanus Toxoids Other (See Comments)    Per allergy skin test  . Tetanus Toxoid     Other reaction(s): Other (See Comments) Rash(horse serum)    Current Outpatient Medications on File Prior to Visit  Medication Sig  . ACCU-CHEK AVIVA PLUS test strip CHECK BLOOD GLUCOSE 3 TIMES DAILY.  [name] acetaminophen (TYLENOL) 500 MG tablet Take 1,000 mg by mouth every 6 (six) hours as needed for moderate pain or headache.   . ALPRAZolam (XANAX) 1 MG tablet Take 0.5 tablets (0.5 mg total) by mouth at bedtime as needed  for sleep.  [name] Alum Hydroxide-Mag Carbonate (GAVISCON PO) Take 4 tablets by mouth daily as needed (acid reflux).   [name] aspirin EC 81 MG tablet Take 81 mg by mouth daily.  [name] atorvastatin (LIPITOR) 40 MG tablet Take 20 mg by mouth every Monday, Wednesday, and Friday.   . B Complex Vitamins (VITAMIN B COMPLEX PO) Take 1 tablet by mouth at bedtime.   . bacitracin 500 UNIT/GM ointment Apply 1 application topically daily as needed for wound care.  . benzonatate (TESSALON) 200 MG capsule Take 1 capsule (200 mg total) by mouth 3 (three) times daily as needed for cough (Max: 676m per day).  . Blood Glucose Monitoring Suppl (ACCU-CHEK AVIVA PLUS) w/Device KIT Check blood sugar 1 time  daily  . Blood Glucose Monitoring Suppl DEVI Test blood sugar up to three times a day or as directed.  . carvedilol (COREG) 6.25 MG tablet Take 1 tablet (6.25 mg total) by mouth 3 (three) times daily.  . Cholecalciferol (VITAMIN D PO) Take 5,000 Units by mouth 2 (two) times daily.  . colchicine 0.6 MG tablet Take 0.6 mg by mouth daily as needed (for gout flare up).   . diphenhydrAMINE (BENADRYL) 25 MG tablet Take 25 mg by mouth at bedtime as needed for allergies.   .  ferrous sulfate 325 (65 FE) MG tablet Take 325 mg by mouth 2 (two) times daily with a meal.  . fluticasone (FLONASE) 50 MCG/ACT nasal spray Place 1 spray into both nostrils daily as needed for allergies.  .Marland KitchenHYDROcodone-acetaminophen (NORCO/VICODIN) 5-325 MG tablet Take 1-2 tablets by mouth every 6 (six) hours as needed for moderate pain.   .Marland Kitcheninsulin NPH-regular Human (70-30) 100 UNIT/ML injection Inject 25 Units into the skin daily. 50 units AM, 40-45 units PM  . lidocaine (LIDODERM) 5 % Place 1 patch onto the skin daily as needed (for pain). Remove & Discard patch within 12 hours or as directed by MD   . loratadine (CLARITIN) 10 MG tablet Take 10 mg by mouth daily as needed for allergies.   . metolazone (ZAROXOLYN) 5 MG tablet TAKE 1 TABLET BY MOUTH  DAILY AS NEEDED (WEIGHT  GAIN AND EDEMA)  . Multiple Vitamin (MULTIVITAMIN WITH MINERALS) TABS tablet Take 1 tablet by mouth at bedtime.   . Multiple Vitamins-Minerals (PRESERVISION AREDS 2) CAPS Take 1 capsule by mouth 2 (two) times daily.   . polyvinyl alcohol (ARTIFICIAL TEARS) 1.4 % ophthalmic solution Place 1 drop into both eyes daily as needed for dry eyes.  . potassium chloride SA (K-DUR,KLOR-CON) 20 MEQ tablet Take 20 mEq by mouth 4 (four) times daily.   .Marland KitchenPRESCRIPTION MEDICATION Inhale into the lungs at bedtime. CPAP  . sertraline (ZOLOFT) 100 MG tablet TAKE 1 TABLET BY MOUTH  DAILY  . torsemide (DEMADEX) 100 MG tablet TAKE 1 TABLET BY MOUTH IN  THE MORNING AND 1/2 TABLET  IN THE EVENING IF NEEDED  FOR WEIGHT GAIN  . warfarin (COUMADIN) 5 MG tablet Take 1 to 2 tablets daily as directed  . allopurinol (ZYLOPRIM) 300 MG tablet Take 1 tablet daily to prevent gout (Patient taking differently: Take 300 mg by mouth daily. )  . azelastine (ASTELIN) 0.1 % nasal spray PLACE 2 SPRAYS INTO BOTH NOSTRILS 2 (TWO) TIMES DAILY. USE IN EACH NOSTRIL AS DIRECTED   No current facility-administered medications on file prior to visit.     ROS: Review of  Systems  Constitutional: Negative.   HENT: Negative.   Eyes: Negative.  Respiratory: Negative.   Cardiovascular: Negative.   Gastrointestinal: Negative.   Genitourinary: Negative.   Musculoskeletal: Negative.   Skin: Negative.      Physical Exam:  BP (!) 134/103   Ht 5' 11"  (1.803 m)   Wt 190 lb 3.2 oz (86.3 kg)   BMI 26.53 kg/m   General Appearance:Well sounding, in no apparent distress.  ENT/Mouth: No hoarseness, No cough for duration of visit.  Respiratory: completing full sentences without distress, without audible wheeze Neuro: Awake and oriented X 3,  Psych:  Insight and Judgment appropriate.      Vicie Mutters, PA-C 11:16 AM [facility]

## 2018-04-25 ENCOUNTER — Ambulatory Visit: Payer: Medicare Other | Admitting: Physician Assistant

## 2018-04-25 ENCOUNTER — Encounter: Payer: Self-pay | Admitting: Physician Assistant

## 2018-04-25 ENCOUNTER — Other Ambulatory Visit: Payer: Self-pay

## 2018-04-25 VITALS — BP 134/103 | Ht 71.0 in | Wt 190.2 lb

## 2018-04-25 DIAGNOSIS — I480 Paroxysmal atrial fibrillation: Secondary | ICD-10-CM

## 2018-04-25 DIAGNOSIS — E1122 Type 2 diabetes mellitus with diabetic chronic kidney disease: Secondary | ICD-10-CM | POA: Diagnosis not present

## 2018-04-25 DIAGNOSIS — I5032 Chronic diastolic (congestive) heart failure: Secondary | ICD-10-CM

## 2018-04-25 DIAGNOSIS — I10 Essential (primary) hypertension: Secondary | ICD-10-CM | POA: Diagnosis not present

## 2018-04-25 DIAGNOSIS — N183 Chronic kidney disease, stage 3 (moderate): Secondary | ICD-10-CM | POA: Diagnosis not present

## 2018-04-25 DIAGNOSIS — N184 Chronic kidney disease, stage 4 (severe): Secondary | ICD-10-CM

## 2018-04-25 DIAGNOSIS — Z794 Long term (current) use of insulin: Secondary | ICD-10-CM

## 2018-04-27 ENCOUNTER — Other Ambulatory Visit: Payer: Self-pay | Admitting: Adult Health

## 2018-04-27 DIAGNOSIS — Z0001 Encounter for general adult medical examination with abnormal findings: Secondary | ICD-10-CM

## 2018-04-27 DIAGNOSIS — E1122 Type 2 diabetes mellitus with diabetic chronic kidney disease: Secondary | ICD-10-CM

## 2018-04-27 DIAGNOSIS — N183 Chronic kidney disease, stage 3 (moderate): Principal | ICD-10-CM

## 2018-05-15 ENCOUNTER — Other Ambulatory Visit: Payer: Self-pay | Admitting: Internal Medicine

## 2018-05-16 ENCOUNTER — Other Ambulatory Visit: Payer: Self-pay

## 2018-05-16 ENCOUNTER — Ambulatory Visit (INDEPENDENT_AMBULATORY_CARE_PROVIDER_SITE_OTHER): Payer: Medicare Other | Admitting: Adult Health

## 2018-05-16 ENCOUNTER — Encounter: Payer: Self-pay | Admitting: Adult Health

## 2018-05-16 VITALS — BP 110/68 | HR 89 | Temp 97.7°F | Ht 71.0 in | Wt 194.0 lb

## 2018-05-16 DIAGNOSIS — M79601 Pain in right arm: Secondary | ICD-10-CM

## 2018-05-16 DIAGNOSIS — M67921 Unspecified disorder of synovium and tendon, right upper arm: Secondary | ICD-10-CM | POA: Diagnosis not present

## 2018-05-16 DIAGNOSIS — T148XXA Other injury of unspecified body region, initial encounter: Secondary | ICD-10-CM

## 2018-05-16 NOTE — Patient Instructions (Signed)
Continue tylenol , PRN hydrocodone for severe pain  Lidocain patches ([place]) - continue topicals  Switch to applying eat  Avoid any heavy lifting or sudden jerkin motions  If "lump" gets worse - call me     Distal Biceps Tendon Disruption The distal biceps tendon is a strong cord of tissue that connects the biceps muscle-which is the muscle on the front of the upper arm-to a bone (radius) in the elbow. Distal biceps tendon disruption is an injury that may include either of the following:  A complete tear (rupture) in the tendon, causing the tendon to completely separate from the radius. When this happens, the biceps muscle pulls up (retracts) toward the shoulder.  A partial tear in the tendon that causes the tendon to partially separate from the radius. This condition can interfere with your ability to turn your hand palm-up (supination) and bend (flex) your elbow. It is often treated with surgery, and recovery may take 4-8 months. What are the causes? This condition is usually caused by suddenly straightening the elbow while the biceps muscle is tightened (contracted). This could happen, for example, while lifting or carrying a heavy object that suddenly falls or shifts position. What increases the risk? The following factors may make you more likely to develop this condition:  Being a man who is 50-47 years old.  Smoking.  Using steroids. What are the signs or symptoms? Symptoms of this condition may include:  Feeling a "pop" in the elbow at the time of injury.  Pain, swelling, and bruising in the front of the elbow.  Weakness in the arm when doing certain movements, such as: ? Lifting or carrying objects. ? Rotating the forearm, such as when you turn a screwdriver. ? Bending the elbow.  A bulge in the upper arm. You may feel this in the front of the arm, the inside of the arm, or both.  Limited range of motion of the elbow and forearm. How is this diagnosed? This  condition may be diagnosed based on:  Your symptoms and medical history.  A physical exam. Your health care provider may test your range of motion by having you do arm movements.  Tests, such as: ? X-rays. ? MRI. ? Ultrasound. How is this treated? Treatment for this condition may include:  Medicines to help relieve pain and inflammation.  A splint or brace to immobilize your elbow.  Wearing a sling to help rest your arm.  Resting from sports and intense physical activity.  Surgery to reattach your tendon to your radius. This is the most common treatment.  Physical therapy. Follow these instructions at home: If you have a splint, brace, or sling:  Wear the splint, brace, or sling as told by your health care provider. Remove it only as told by your health care provider.  Loosen the splint, brace, or sling if your fingers tingle, become numb, or turn cold and blue.  Keep the splint, brace, or sling clean and dry. Bathing  Do not take baths, swim, or use a hot tub until your health care provider approves. Ask your health care provider if you may take showers. You may only be allowed to take sponge baths.  If you have a splint, brace, or sling that is not waterproof: ? Do not let it get wet. ? Cover it with a watertight covering when you take a bath or shower. Managing pain, stiffness, and swelling      If directed, put ice on the injured area: ?  Put ice in a plastic bag. ? Place a towel between your skin and the bag. ? Leave the ice on for 20 minutes, 2-3 times a day.  If directed, apply heat to the affected area before you exercise or as often as told by your health care provider. Use the heat source that your health care provider recommends, such as a moist heat pack or a heating pad. ? Place a towel between your skin and the heat source. ? Leave the heat on for 20-30 minutes. ? Remove the heat if your skin turns bright red. This is especially important if you are  unable to feel pain, heat, or cold. You may have a greater risk of getting burned.  Move your fingers often to avoid stiffness and to lessen swelling.  Raise (elevate) the injured area above the level of your heart while you are sitting or lying down. Activity  Return to your normal activities as told by your health care provider. Ask your health care provider what activities are safe for you.  Until your health care provider approves: ? Do not lift or carry anything with your injured arm. ? Do not use the affected limb to support your body weight. ? Do not participate in contact sports.  Avoid activities that cause pain or make your condition worse.  Do exercises as told by your physical therapist or health care provider. General instructions  Take over-the-counter and prescription medicines only as told by your health care provider.  If you have a splint, do not put pressure on any part of the splint until it is fully hardened. This may take several hours.  Ask your health care provider when it is safe to drive if you have a splint, brace, or sling on your arm.  Do not use any products that contain nicotine or tobacco, such as cigarettes and e-cigarettes. If you need help quitting, ask your health care provider.  Keep all follow-up visits as told by your health care provider. This is important. Contact a health care provider if:  Your splint or brace causes pain or numbness. Get help right away if:  You develop severe pain.  You develop numbness or tingling in your hand.  Your hand feels unusually cold.  Your fingernails turn a dark color, such as blue or gray. Summary  Distal biceps tendon disruption is an injury that may involve a complete or partial tear of the tendon.  This condition is usually caused by suddenly straightening the elbow while the biceps muscle is tightened.  Treatment may include medicines, rest, physical therapy, immobilization, or surgery. This  information is not intended to replace advice given to you by your health care provider. Make sure you discuss any questions you have with your health care provider. Document Released: 12/22/2004 Document Revised: 05/17/2017 Document Reviewed: 05/17/2017 Elsevier Interactive Patient Education  2019 [employer]     Hematoma A hematoma is a collection of blood under the skin, in an organ, in a body space, in a joint space, or in other tissue. The blood can thicken (clot) to form a lump that you can see and feel. The lump is often firm and may become sore and tender. Most hematomas get better in a few days to weeks. However, some hematomas may be serious and require medical care. Hematomas can range from very small to very large. What are the causes? This condition is caused by:  A blunt or penetrating injury.  A leakage from a blood vessel  under the skin.  Some medical procedures, including surgeries, such as oral surgery, face lifts, and surgeries on the joints.  Some medical conditions that cause bleeding or bruising. There may be multiple hematomas that appear in different areas of the body. What increases the risk? You are more likely to develop this condition if:  You are an older adult.  You use blood thinners. What are the signs or symptoms?  Symptoms of this condition depend on where the hematoma is located.  Common symptoms of a hematoma that is under the skin include:  A firm lump on the body.  Pain and tenderness in the area.  Bruising. Blue, dark blue, purple-red, or yellowish skin (discoloration) may appear at the site of the hematoma if the hematoma is close to the surface of the skin. Common symptoms of a hematoma that is deep in the tissues or body spaces may be less obvious. They include:  A collection of blood in the stomach (intra-abdominal hematoma). This may cause pain in the abdomen, weakness, fainting, and shortness of breath.  A collection of blood in  the head (intracranial hematoma). This may cause a headache or symptoms such as weakness, trouble speaking or understanding, or a change in consciousness. How is this diagnosed? This condition is diagnosed based on:  Your medical history.  A physical exam.  Imaging tests, such as an ultrasound or CT scan. These may be needed if your health care provider suspects a hematoma in deeper tissues or body spaces.  Blood tests. These may be needed if your health care provider believes that the hematoma is caused by a medical condition. How is this treated? Treatment for this condition depends on the cause, size, and location of the hematoma. Treatment may include:  Doing nothing. The majority of hematomas do not need treatment as many of them go away on their own over time.  Surgery or close monitoring. This may be needed for large hematomas or hematomas that affect vital organs.  Medicines. Medicines may be given if there is an underlying medical cause for the hematoma. Follow these instructions at home: Managing pain, stiffness, and swelling   If directed, put ice on the affected area. ? Put ice in a plastic bag. ? Place a towel between your skin and the bag. ? Leave the ice on for 20 minutes, 2-3 times a day for the first couple of days.  If directed, apply heat to the affected area after applying ice for a couple of days. Use the heat source that your health care provider recommends, such as a moist heat pack or a heating pad. ? Place a towel between your skin and the heat source. ? Leave the heat on for 20-30 minutes. ? Remove the heat if your skin turns bright red. This is especially important if you are unable to feel pain, heat, or cold. You may have a greater risk of getting burned.  Raise (elevate) the affected area above the level of your heart while you are sitting or lying down.  If told, wrap the affected area with an elastic bandage. The bandage applies pressure  (compression) to the area, which may help to reduce swelling and promote healing. Do not wrap the bandage too tightly around the affected area.  If your hematoma is on a leg or foot (lower extremity) and is painful, your health care provider may recommend crutches. Use them as told by your health care provider. General instructions  Take over-the-counter and prescription medicines only  as told by your health care provider.  Keep all follow-up visits as told by your health care provider. This is important. Contact a health care provider if:  You have a fever.  The swelling or discoloration gets worse.  You develop more hematomas. Get help right away if:  Your pain is worse or your pain is not controlled with medicine.  Your skin over the hematoma breaks or starts bleeding.  Your hematoma is in your chest or abdomen and you have weakness, shortness of breath, or a change in consciousness.  You have a hematoma on your scalp that is caused by a fall or injury, and you also have: ? A headache that gets worse. ? Trouble speaking or understanding speech. ? Weakness. ? Change in alertness or consciousness. Summary  A hematoma is a collection of blood under the skin, in an organ, in a body space, in a joint space, or in other tissue.  This condition usually does not need treatment because many hematomas go away on their own over time.  Large hematomas, or those that may affect vital organs, may need surgical drainage or monitoring. If the hematoma is caused by a medical condition, medicines may be prescribed.  Get help right away if your hematoma breaks or starts to bleed, you have shortness of breath, or you have a headache or trouble speaking after a fall. This information is not intended to replace advice given to you by your health care provider. Make sure you discuss any questions you have with your health care provider. Document Released: 08/06/2003 Document Revised: 05/27/2017  Document Reviewed: 05/27/2017 Elsevier Interactive Patient Education  2019 [employer].

## 2018-05-16 NOTE — Progress Notes (Signed)
Assessment and Plan:  [name] was seen today for arm injury.  Diagnoses and all orders for this visit:  Arm pain, anterior, right with  Biceps tendinopathy of right upper extremity/Hematoma Biceps not fully detached, ? Partial tear through short head of biceps + hematoma consequent of coumadin therapy Very slight decrease in strength against resistance only Discussed monitoring vs imaging by [place] vs orthopedic follow up He is very high risk with covid 19 and prefers to limit exposure -  He is managing pain well with tylenol, hydrocodone/acet, topicals In light of mild symptoms other than dramatic ecchymosis will pursue conservative therapy  Monitor, avoid straining, lifting, try applying heat and gentle massage to encourage resolution of hematoma; follow up in 1 week for 3MOV/follow up, call if any worse, sudden increase in lump size/pain or with increasing weakness He is in agreement with plan of care after discussion  Further disposition pending results of labs. Discussed med's effects and SE's.   Over 15 minutes of exam, counseling, chart review, and critical decision making was performed.   Future Appointments  Date Time Provider [place]  05/19/2018 12:45 PM Hayden Pedro, MD [facility] None  06/07/2018  3:00 PM [name], MD [facility] None  09/19/2018  3:00 PM Liane Comber, NP [facility] None    ------------------------------------------------------------------------------------------------------------------   HPI 78 y.o.male R handed, on coumadin for PAF presents for evaluation of tender R upper arm/ biceps. He reports onset as 1 week ago when he pulled lawn equipment starter at an awkward angle (did not fall, twist or srike area). Initially sharp shooting pain for 20 seconds or so then eased off; he reports persistent tednerness and dull ache. He reports a large lump appeared to area over the course of that day with extensive bruising that has continued to spread  since then. He reports current pain at 3/10, intermittently worse over the course of the day up to 8/10. Worse when arm pressed against body or with full extension or when picking anything up at a particular angle.   He has been taking tylenol and leftover hydrocortisone/acet 5 mg tabs (has plenty leftover from previous procedure). He is also utilizing OTC lidocaine patches and numbing creams.   Formerly established with [facility], more recently has seen ? [facility] for acute complaint. He reports hx of MRI of that extremity demonstrating partial rotator cuff tear and ? Biceps tenden fraying, but declined surgical intervention and recovered well.    Lab Results  Component Value Date   INR 2.7 (H) 04/19/2018   INR 3.3 (H) 03/23/2018   INR 1.9 (H) 02/22/2018     Past Medical History:  Diagnosis Date  . Adrenal adenoma   . Anemia   . Anxiety   . Arthritis   . Atypical atrial flutter ([place]) 8/15, 10/15   a. DCCV 08/2013. b. s/p RFA 10/2013.  [name] Basal cell carcinoma   . CAD (coronary artery disease)    a. 04/2013 CABG x 2: LIMA to LAD, SVG to RI, EVH via R thigh.  . Cellulitis 12/2015   left leg  . Chronic diastolic congestive heart failure ([place])   . CKD (chronic kidney disease), stage III ([place])   . Depression   . Diabetes mellitus type II   . Diverticulosis 2001  . DJD (degenerative joint disease)   . GERD (gastroesophageal reflux disease)   . Gout   . H/O hiatal hernia   . History of cardioversion    x3 (years uncertain)  . Hx  of adenomatous colonic polyps   . Hyperlipidemia   . Hypertension   . Hypertensive cardiomyopathy ([place])   . Obstructive sleep apnea    compliant with CPAP  . 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 Persistent atrial fibrillation    a. s/p MAZE 04/2013 in setting of CABG. b. Amio stopped in 10/2013 after flutter ablation.  [name] PFO  (patent foramen ovale)    a. Small PFO by TEE 10/2013.  [name] Pleural effusion, left    a. s/p thoracentesis 05/2013.  [name] Respiratory failure ([place])    a. Hypoxia 10/2013 - required supp O2 as inpatient, did not require it at discharge.  . S/P Maze operation for atrial fibrillation    a. 04/2013: Complete bilateral atrial lesion set using cryothermy and bipolar radiofrequency ablation with clipping of LA appendage (@ time of CABG)  . S/P Maze operation for atrial fibrillation 04/05/2013   Complete bilateral atrial lesion set using cryothermy and bipolar radiofrequency ablation with clipping of LA appendage via median sternotomy approach      Allergies  Allergen Reactions  . Sunflower Seed [Sunflower Oil] Swelling and Other (See Comments)    Tongue and lip swelling  . Horse-Derived Products Other (See Comments)    Per allergy skin test UNSPECIFIED REACTION   . Tetanus Toxoids Other (See Comments)    Per allergy skin test  . Tetanus Toxoid     Other reaction(s): Other (See Comments) Rash(horse serum)    Current Outpatient Medications on File Prior to Visit  Medication Sig  . ACCU-CHEK AVIVA PLUS test strip CHECK BLOOD GLUCOSE 3 TIMES DAILY.  [name] acetaminophen (TYLENOL) 500 MG tablet Take 1,000 mg by mouth every 6 (six) hours as needed for moderate pain or headache.   . allopurinol (ZYLOPRIM) 300 MG tablet Take 1 tablet daily to prevent gout (Patient taking differently: Take 300 mg by mouth daily. )  . ALPRAZolam (XANAX) 1 MG tablet Take 0.5 tablets (0.5 mg total) by mouth at bedtime as needed for sleep.  [name] Alum Hydroxide-Mag Carbonate (GAVISCON PO) Take 4 tablets by mouth daily as needed (acid reflux).   [name] aspirin EC 81 MG tablet Take 81 mg by mouth daily.  [name] atorvastatin (LIPITOR) 40 MG tablet Take 20 mg by mouth every Monday, Wednesday, and Friday.   [name] azelastine (ASTELIN) 0.1 % nasal spray PLACE 2 SPRAYS INTO BOTH NOSTRILS 2 (TWO) TIMES DAILY. USE IN EACH NOSTRIL AS DIRECTED  . B Complex  Vitamins (VITAMIN B COMPLEX PO) Take 1 tablet by mouth at bedtime.   . bacitracin 500 UNIT/GM ointment Apply 1 application topically daily as needed for wound care.  . benzonatate (TESSALON) 200 MG capsule Take 1 capsule (200 mg total) by mouth 3 (three) times daily as needed for cough (Max: 668m per day).  . Blood Glucose Monitoring Suppl (ACCU-CHEK AVIVA PLUS) w/Device KIT Check blood sugar 1 time  daily  . Blood Glucose Monitoring Suppl DEVI Test blood sugar up to three times a day or as directed.  . carvedilol (COREG) 6.25 MG tablet Take 1 tablet 3 x /day for BP & Heart  . Cholecalciferol (VITAMIN D PO) Take 5,000 Units by mouth daily.   . colchicine 0.6 MG tablet Take 0.6 mg by mouth daily as needed (for gout flare up).   . diphenhydrAMINE (BENADRYL) 25 MG tablet Take 25 mg by mouth at bedtime as needed for allergies.   .[name]  ferrous sulfate 325 (65 FE) MG tablet Take 325 mg by mouth 2 (two) times daily with a meal.  . fluticasone (FLONASE) 50 MCG/ACT nasal spray Place 1 spray into both nostrils daily as needed for allergies.  [name] HYDROcodone-acetaminophen (NORCO/VICODIN) 5-325 MG tablet Take 1-2 tablets by mouth every 6 (six) hours as needed for moderate pain.   [name] insulin NPH-regular Human (70-30) 100 UNIT/ML injection Inject 25 Units into the skin daily. 50 units AM, 40-45 units PM  . lidocaine (LIDODERM) 5 % Place 1 patch onto the skin daily as needed (for pain). Remove & Discard patch within 12 hours or as directed by MD   . loratadine (CLARITIN) 10 MG tablet Take 10 mg by mouth daily as needed for allergies.   . metolazone (ZAROXOLYN) 5 MG tablet TAKE 1 TABLET BY MOUTH  DAILY AS NEEDED (WEIGHT  GAIN AND EDEMA)  . Multiple Vitamin (MULTIVITAMIN WITH MINERALS) TABS tablet Take 1 tablet by mouth at bedtime.   . Multiple Vitamins-Minerals (PRESERVISION AREDS 2) CAPS Take 1 capsule by mouth 2 (two) times daily.   . polyvinyl alcohol (ARTIFICIAL TEARS) 1.4 % ophthalmic solution Place 1 drop into  both eyes daily as needed for dry eyes.  . potassium chloride SA (K-DUR,KLOR-CON) 20 MEQ tablet Take 20 mEq by mouth 4 (four) times daily.   [name] PRESCRIPTION MEDICATION Inhale into the lungs at bedtime. CPAP  . sertraline (ZOLOFT) 100 MG tablet TAKE 1 TABLET BY MOUTH  DAILY  . torsemide (DEMADEX) 100 MG tablet TAKE 1 TABLET BY MOUTH IN  THE MORNING AND 1/2 TABLET  IN THE EVENING IF NEEDED  FOR WEIGHT GAIN (Patient taking differently: TAKE 1 TABLET BY MOUTH IN  THE MORNING AND 1/2 TABLET  IN THE AFTERNOON IF NEEDED  FOR WEIGHT GAIN)  . warfarin (COUMADIN) 5 MG tablet Take 1 to 2 tablets daily as directed   No current facility-administered medications on file prior to visit.     ROS: all negative except above.   Physical Exam:  BP 110/68   Pulse 89   Temp 97.7 F (36.5 C)   Ht 5' 11"  (1.803 m)   Wt 194 lb (88 kg)   SpO2 99%   BMI 27.06 kg/m   General Appearance: Well nourished, in no apparent distress. Eyes: PERRLA, EOMs, conjunctiva no swelling or erythema Sinuses: No Frontal/maxillary tenderness ENT/Mouth: Hearing normal.  Neck: Supple Respiratory: Respiratory effort normal, BS equal bilaterally without rales, rhonchi, wheezing or stridor.  Cardio: Irregularly irregular with no MRGs. Brisk peripheral pulses without edema.  Lymphatics: Non tender without lymphadenopathy.  Musculoskeletal: Full ROM through shoulder and elbow, very subtle weakness 4/5 to R with flexion, external rotation against resistance; he has tender distinct lump to R upper biceps, approx 8 cm round area; distal tendon intact; normal gait. Speed's and Yagerson's positive. Distal drips intact.  Skin: Warm, dry without rashes, lesions; extensive ecchymosis to R upper arm medially/anteriorly extending down into proximal forearm.  Neuro: Sensation intact.  Psych: Awake and oriented X 3, normal affect, Insight and Judgment appropriate.     Cory Ribas, NP 1:05 PM [facility]

## 2018-05-17 ENCOUNTER — Other Ambulatory Visit: Payer: Self-pay | Admitting: Adult Health

## 2018-05-17 DIAGNOSIS — M79601 Pain in right arm: Secondary | ICD-10-CM

## 2018-05-17 MED ORDER — HYDROCODONE-ACETAMINOPHEN 5-325 MG PO TABS: 1.0000 | ORAL_TABLET | Freq: Four times a day (QID) | ORAL | 0 refills | Status: DC | PRN

## 2018-05-18 ENCOUNTER — Encounter: Payer: Self-pay | Admitting: Internal Medicine

## 2018-05-19 ENCOUNTER — Encounter (INDEPENDENT_AMBULATORY_CARE_PROVIDER_SITE_OTHER): Payer: Medicare Other | Admitting: Ophthalmology

## 2018-05-19 ENCOUNTER — Other Ambulatory Visit: Payer: Self-pay

## 2018-05-19 DIAGNOSIS — H353132 Nonexudative age-related macular degeneration, bilateral, intermediate dry stage: Secondary | ICD-10-CM | POA: Diagnosis not present

## 2018-05-19 DIAGNOSIS — H34832 Tributary (branch) retinal vein occlusion, left eye, with macular edema: Secondary | ICD-10-CM | POA: Diagnosis not present

## 2018-05-19 DIAGNOSIS — H43813 Vitreous degeneration, bilateral: Secondary | ICD-10-CM

## 2018-05-19 DIAGNOSIS — H35033 Hypertensive retinopathy, bilateral: Secondary | ICD-10-CM

## 2018-05-19 DIAGNOSIS — H338 Other retinal detachments: Secondary | ICD-10-CM

## 2018-05-19 DIAGNOSIS — I10 Essential (primary) hypertension: Secondary | ICD-10-CM | POA: Diagnosis not present

## 2018-05-19 NOTE — Progress Notes (Signed)
FOLLOW UP  Assessment and Plan:   Diagnoses and all orders for this visit:  PAF (paroxysmal atrial fibrillation) ([place]) Check INR and will adjust medication according to labs.  Discussed if patient falls to immediately contact office or go to ER. Discussed foods that can increase or decrease Coumadin levels. Patient understands to call the office before starting a new medication. Follow up in one month.   Senile purpura (HCC) Fragile secondary to age; monitor closely for excessive bleeding r/t anticoagulation  CHF Stable, appears euvolemic Emphasized salt restriction, less than 205m a day. Encouraged daily monitoring of the patient's weight, call office if 5 lb weight loss or gain in a day.  Encouraged regular exercise. If any increasing shortness of breath, swelling, or chest pressure go to ER immediately.  decrease your fluid intake to less than 2 L daily please remember to always increase your potassium intake with any increase of your fluid pill.   CKD (chronic kidney disease) stage 3, GFR 30-59 ml/min (HCC) Increase fluids, avoid NSAIDS, monitor sugars, will monitor  Hypertension Well controlled with current medications  Monitor blood pressure at home; patient to call if consistently greater than 130/80 Continue DASH diet.   Reminder to go to the ER if any CP, SOB, nausea, dizziness, severe HA, changes vision/speech, left arm numbness and tingling and jaw pain.  Cholesterol Has been trending up; discussed LDL goal <70; resume atorvastatin 20 mg daily which he has tolerated well in the past if similarly elevated on today's labs Continue low cholesterol diet and exercise.  Check lipid panel.   Controlled type 2 diabetes mellitus with stage 3 chronic kidney disease, without long-term current use of insulin (HCC) Continue medications; novolin 70/30 - decrease to 45 units AM, 30-35 units PM due to frequent hypoglycemia - call office if continued episodes or if glucose trending  up significantly  Recently had eye exam - report requested  Continue diet and exercise.  Perform daily foot/skin check, notify office of any concerning changes.  Check A1C  Vitamin D Def/ osteoporosis prevention At goal last check; continue supplement for goal of 60-100 Defer Vit D level  Recurrent major depressive disorder, in full remission ([place] Well managed with current regimen; Continue medications  Lifestyle discussed: diet/exerise, sleep hygiene, stress management, hydration  Gout Continue allopurinol Diet discussed Check uric acid as needed, will check today and forward to Dr. AKathlene November Bilateral cerumen impaction - stop using Qtips, irrigation used in the office without complications, use OTC drops/oil at home to prevent reoccurence  R arm pain  Improved; ? Hematoma resolving, no palpable abnormality, active ROM fully intact, no significant weakness against resistance Continue to avoid lifting/straining, possible partial/mild biceps muscle tear Continue tylenol PRN, contact office if any sudden increase in swelling, pain, strength  Continue diet and meds as discussed. Further disposition pending results of labs. Discussed med's effects and SE's.   Over 30 minutes of exam, counseling, chart review, and critical decision making was performed.   Future Appointments  Date Time Provider [place] 06/07/2018  3:00 PM MUnk Pinto MD [facility] None  09/02/2018 12:30 PM MHayden Pedro MD [facility] None  09/19/2018  3:00 PM CLiane Comber NP [facility] None    ----------------------------------------------------------------------------------------------------------------------  HPI 78y.o. male  presents for 3 month follow up on chronic medical conditions - htn, CHF, T2DM, hyperlipidemia, CKD stage 3, and recurrent major depression currently in remission on treatment, and 4 week follow up for coumadin monitoring.  The patient is s/p  CABG & MAZE procedure in 2015;  has also had RF Ablation for Afib, followed by Dr Missy Sabins in the HF clinic. 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  He does admit exertional dyspnea with moderate activity. He is due to follow up with cardiology.   He is also here for 1 week follow up for R arm bruising/hematoma, suspected possible partial biceps tear with hematoma that he opted to pursue monitoring only due to no significant weakness in order to avoid potential covid 19 exposure at imaging center or medical offices. Lump significantly improved, pain is minimal per patient. Weakness is minimal with full active ROM. He is avoiding lifting and straining. He is using tylenol mainly, has 5/325 mg hydrocodone if needed for severe pain but hasn't needed since initial few days.   he has a diagnosis of recurrent major depression and is currently on zoloft 100 mg, reports symptoms are well controlled on current regimen. He is also prescribed xanax 0.5-1 mg PRN, can't recall when he last took it, uses very rarely.  Patient is on Coumadin for PAF (paroxysmal atrial fibrillation) ([place]) [I48.0] Patient's last INR is  Lab Results  Component Value Date   INR 2.7 (H) 04/19/2018   INR 3.3 (H) 03/23/2018   INR 1.9 (H) 02/22/2018    Patient denies SOB, CP, dizziness, nose bleeds, easy bleeding, and blood in stool/urine. His coumadin dose was not changed last visit. He has not taken ABX, has not missed any doses and denies a fall.    Current dose: 2 tabs of 5 mg (10 mg daily) -   BMI is Body mass index is 26.78 kg/m., he has been working on diet (cutting out bread, excessive carbs) and exercise, he walks 20+ min 3-4 days a week.  Wt Readings from Last 3 Encounters:  05/23/18 192 lb (87.1 kg)  05/16/18 194 lb (88 kg)  04/25/18 190 lb 3.2 oz (86.3 kg)   His blood pressure has been controlled at home, today their BP is BP: 118/64  He does not workout. He denies chest pain, dizziness. He endorses some SOB with exertion.     He is on cholesterol medication (atorvastatin 20 mg MWF and has been on this dose for quite some time, with LDLs previsouly fairly controlled in 70s) and denies myalgias. His LDL cholesterol is not at goal; trigs remain elevated. The cholesterol last visit was:   Lab Results  Component Value Date   CHOL 160 02/22/2018   HDL 29 (L) 02/22/2018   LDLCALC 97 02/22/2018   TRIG 217 (H) 02/22/2018   CHOLHDL 5.5 (H) 02/22/2018    He has been working on diet and exercise for type 2 diabetes with CKD III/IV off of oral medications (treated by novolin 70/30, taking 50 units AM, 35 units PM), and denies increased appetite, nausea, paresthesia of the feet, polydipsia, polyuria, visual disturbances and vomiting. He does check fasting AM glucose, 100-110 (has has several low this week down to 58),  evening sugars ranging 170-low 200s. Last A1C in the office was:  Lab Results  Component Value Date   HGBA1C 7.1 (H) 02/22/2018    CKD followed by Dr. Carolin Sicks via [facility]:  Lab Results  Component Value Date   GFRNONAA 31 (L) 04/19/2018   Patient is on Vitamin D supplementt:    Lab Results  Component Value Date   VD25OH 86 02/22/2018     Patient is on allopurinol for gout and does not report a recent flare.  Followed by [name].  Lab Results  Component Value Date   LABURIC 7.9 11/08/2017     Current Medications:  Current Outpatient Medications on File Prior to Visit  Medication Sig  . ACCU-CHEK AVIVA PLUS test strip CHECK BLOOD GLUCOSE 3 TIMES DAILY.  [name] acetaminophen (TYLENOL) 500 MG tablet Take 1,000 mg by mouth every 6 (six) hours as needed for moderate pain or headache.   . allopurinol (ZYLOPRIM) 300 MG tablet Take 1 tablet daily to prevent gout (Patient taking differently: Take 300 mg by mouth daily. )  . ALPRAZolam (XANAX) 1 MG tablet Take 0.5 tablets (0.5 mg total) by mouth at bedtime as needed for sleep.  [name] Alum Hydroxide-Mag Carbonate (GAVISCON PO) Take 4 tablets by mouth daily  as needed (acid reflux).   [name] aspirin EC 81 MG tablet Take 81 mg by mouth daily.  [name] atorvastatin (LIPITOR) 40 MG tablet Take 20 mg by mouth every Monday, Wednesday, and Friday.   [name] azelastine (ASTELIN) 0.1 % nasal spray PLACE 2 SPRAYS INTO BOTH NOSTRILS 2 (TWO) TIMES DAILY. USE IN EACH NOSTRIL AS DIRECTED (Patient taking differently: Place 2 sprays into both nostrils as needed. Use in each nostril as directed)  . B Complex Vitamins (VITAMIN B COMPLEX PO) Take 1 tablet by mouth at bedtime.   . bacitracin 500 UNIT/GM ointment Apply 1 application topically daily as needed for wound care.  . benzonatate (TESSALON) 200 MG capsule Take 1 capsule (200 mg total) by mouth 3 (three) times daily as needed for cough (Max: 642m per day).  . Blood Glucose Monitoring Suppl (ACCU-CHEK AVIVA PLUS) w/Device KIT Check blood sugar 1 time  daily  . Blood Glucose Monitoring Suppl DEVI Test blood sugar up to three times a day or as directed.  . carvedilol (COREG) 6.25 MG tablet Take 1 tablet 3 x /day for BP & Heart  . Cholecalciferol (VITAMIN D PO) Take 5,000 Units by mouth daily.   . colchicine 0.6 MG tablet Take 0.6 mg by mouth daily as needed (for gout flare up).   . diphenhydrAMINE (BENADRYL) 25 MG tablet Take 25 mg by mouth at bedtime as needed for allergies.   . ferrous sulfate 325 (65 FE) MG tablet Take 325 mg by mouth 2 (two) times daily with a meal.  . fluticasone (FLONASE) 50 MCG/ACT nasal spray Place 1 spray into both nostrils daily as needed for allergies.  .Marland Kitcheninsulin NPH-regular Human (70-30) 100 UNIT/ML injection Inject 25 Units into the skin daily. 50 units AM, 40-45 units PM  . lidocaine (LIDODERM) 5 % Place 1 patch onto the skin daily as needed (for pain). Remove & Discard patch within 12 hours or as directed by MD   . loratadine (CLARITIN) 10 MG tablet Take 10 mg by mouth daily as needed for allergies.   . metolazone (ZAROXOLYN) 5 MG tablet TAKE 1 TABLET BY MOUTH  DAILY AS NEEDED (WEIGHT  GAIN AND EDEMA)   . Multiple Vitamin (MULTIVITAMIN WITH MINERALS) TABS tablet Take 1 tablet by mouth at bedtime.   . Multiple Vitamins-Minerals (PRESERVISION AREDS 2) CAPS Take 1 capsule by mouth 2 (two) times daily.   . polyvinyl alcohol (ARTIFICIAL TEARS) 1.4 % ophthalmic solution Place 1 drop into both eyes daily as needed for dry eyes.  . potassium chloride SA (K-DUR,KLOR-CON) 20 MEQ tablet Take 20 mEq by mouth 4 (four) times daily.   .Marland KitchenPRESCRIPTION MEDICATION Inhale into the lungs at bedtime. CPAP  . sertraline (ZOLOFT) 100 MG tablet TAKE 1  TABLET BY MOUTH  DAILY  . torsemide (DEMADEX) 100 MG tablet TAKE 1 TABLET BY MOUTH IN  THE MORNING AND 1/2 TABLET  IN THE EVENING IF NEEDED  FOR WEIGHT GAIN (Patient taking differently: TAKE 1 TABLET BY MOUTH IN  THE MORNING AND 1/2 TABLET  IN THE AFTERNOON IF NEEDED  FOR WEIGHT GAIN)  . warfarin (COUMADIN) 5 MG tablet Take 1 to 2 tablets daily as directed   No current facility-administered medications on file prior to visit.      Allergies:  Allergies  Allergen Reactions  . Sunflower Seed [Sunflower Oil] Swelling and Other (See Comments)    Tongue and lip swelling  . Horse-Derived Products Other (See Comments)    Per allergy skin test UNSPECIFIED REACTION   . Tetanus Toxoids Other (See Comments)    Per allergy skin test  . Tetanus Toxoid     Other reaction(s): Other (See Comments) Rash(horse serum)     Medical History:  Past Medical History:  Diagnosis Date  . Adrenal adenoma   . Anemia   . Anxiety   . Arthritis   . Atypical atrial flutter ([place]) 8/15, 10/15   a. DCCV 08/2013. b. s/p RFA 10/2013.  [name] Basal cell carcinoma   . CAD (coronary artery disease)    a. 04/2013 CABG x 2: LIMA to LAD, SVG to RI, EVH via R thigh.  . Cellulitis 12/2015   left leg  . Chronic diastolic congestive heart failure ([place])   . CKD (chronic kidney disease), stage III ([place])   . Depression   . Diabetes mellitus type II   . Diverticulosis 2001  . DJD (degenerative joint  disease)   . GERD (gastroesophageal reflux disease)   . Gout   . H/O hiatal hernia   . History of cardioversion    x3 (years uncertain)  . Hx of adenomatous colonic polyps   . Hyperlipidemia   . Hypertension   . Hypertensive cardiomyopathy ([place])   . Obstructive sleep apnea    compliant with CPAP  . 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 Persistent atrial fibrillation    a. s/p MAZE 04/2013 in setting of CABG. b. Amio stopped in 10/2013 after flutter ablation.  [name] PFO (patent foramen ovale)    a. Small PFO by TEE 10/2013.  [name] Pleural effusion, left    a. s/p thoracentesis 05/2013.  [name] Respiratory failure ([place])    a. Hypoxia 10/2013 - required supp O2 as inpatient, did not require it at discharge.  . S/P Maze operation for atrial fibrillation    a. 04/2013: Complete bilateral atrial lesion set using cryothermy and bipolar radiofrequency ablation with clipping of LA appendage (@ time of CABG)  . S/P Maze operation for atrial fibrillation 04/05/2013   Complete bilateral atrial lesion set using cryothermy and bipolar radiofrequency ablation with clipping of LA appendage via median sternotomy approach    Family history- Reviewed and unchanged Social history- Reviewed and unchanged   Review of Systems:  Review of Systems  Constitutional: Negative for malaise/fatigue and weight loss.  HENT: Negative for hearing loss and tinnitus.   Eyes: Negative for blurred vision and double vision.  Respiratory: Positive for shortness of breath (exertional, chronic respiratory failure). Negative for cough and wheezing.   Cardiovascular: Negative for chest pain, palpitations, orthopnea, claudication and leg swelling.  Gastrointestinal: Negative for abdominal pain, blood in stool, constipation, diarrhea, heartburn, melena, nausea  and vomiting.  Genitourinary: Negative.   Musculoskeletal: Negative for  joint pain and myalgias.  Skin: Negative for rash.  Neurological: Negative for dizziness, tingling, sensory change, weakness and headaches.  Endo/Heme/Allergies: Positive for environmental allergies. Negative for polydipsia. Bruises/bleeds easily.  Psychiatric/Behavioral: Negative.   All other systems reviewed and are negative.   Physical Exam: BP 118/64   Pulse 91   Temp 98.1 F (36.7 C)   Ht _0  (1.803 m)   Wt 192 lb (87.1 kg)   SpO2 97%   BMI 26.78 kg/m  Wt Readings from Last 3 Encounters:  05/23/18 192 lb (87.1 kg)  05/16/18 194 lb (88 kg)  04/25/18 190 lb 3.2 oz (86.3 kg)   General Appearance: Well nourished, in no apparent distress. Eyes: PERRLA, EOMs, conjunctiva no swelling or erythema Sinuses: No Frontal/maxillary tenderness ENT/Mouth: Ext aud canals bilaterally obstructed by soft wax. No erythema, swelling, or exudate on post pharynx.  Tonsils not swollen or erythematous. Hearing normal.  Neck: Supple, thyroid normal.  Respiratory: Respiratory effort normal, BS equal bilaterally without rales, rhonchi, wheezing or stridor.  Cardio: Rate irregular with no murmurs. 1+ peripheral pulses without edema.  Abdomen: Soft, + BS.  Non tender, no guarding, rebound, hernias, masses. Lymphatics: Non tender without lymphadenopathy.  Musculoskeletal: Full ROM, 5/5 strength, Normal gait, extensive bruising, yellowed to RUE, tender lump over biceps with distal tendon intact, no palpable tear/detachment, full active ROM Skin: Warm, dry without rashes; scattered flat dark brown discoloration to neck and forearms - some scattered small bruises. Erythematous pendular skin tag like nevi to R axilla.  Neuro: Cranial nerves intact. No cerebellar symptoms.  Psych: Awake and oriented X 3, normal affect, Insight and Judgment appropriate.    Izora Ribas, NP 12:17 PM [facility]

## 2018-05-23 ENCOUNTER — Other Ambulatory Visit: Payer: Self-pay

## 2018-05-23 ENCOUNTER — Ambulatory Visit (INDEPENDENT_AMBULATORY_CARE_PROVIDER_SITE_OTHER): Payer: Medicare Other | Admitting: Adult Health

## 2018-05-23 ENCOUNTER — Encounter: Payer: Self-pay | Admitting: Adult Health

## 2018-05-23 VITALS — BP 118/64 | HR 91 | Temp 98.1°F | Ht 71.0 in | Wt 192.0 lb

## 2018-05-23 DIAGNOSIS — Z794 Long term (current) use of insulin: Secondary | ICD-10-CM

## 2018-05-23 DIAGNOSIS — E782 Mixed hyperlipidemia: Secondary | ICD-10-CM

## 2018-05-23 DIAGNOSIS — E1122 Type 2 diabetes mellitus with diabetic chronic kidney disease: Secondary | ICD-10-CM | POA: Diagnosis not present

## 2018-05-23 DIAGNOSIS — I10 Essential (primary) hypertension: Secondary | ICD-10-CM | POA: Diagnosis not present

## 2018-05-23 DIAGNOSIS — N183 Chronic kidney disease, stage 3 (moderate): Secondary | ICD-10-CM

## 2018-05-23 DIAGNOSIS — N184 Chronic kidney disease, stage 4 (severe): Secondary | ICD-10-CM | POA: Diagnosis not present

## 2018-05-23 DIAGNOSIS — M1A9XX0 Chronic gout, unspecified, without tophus (tophi): Secondary | ICD-10-CM

## 2018-05-23 DIAGNOSIS — H6123 Impacted cerumen, bilateral: Secondary | ICD-10-CM | POA: Diagnosis not present

## 2018-05-23 DIAGNOSIS — D509 Iron deficiency anemia, unspecified: Secondary | ICD-10-CM

## 2018-05-23 DIAGNOSIS — I5032 Chronic diastolic (congestive) heart failure: Secondary | ICD-10-CM | POA: Diagnosis not present

## 2018-05-23 DIAGNOSIS — E559 Vitamin D deficiency, unspecified: Secondary | ICD-10-CM

## 2018-05-23 DIAGNOSIS — I480 Paroxysmal atrial fibrillation: Secondary | ICD-10-CM

## 2018-05-23 MED ORDER — INSULIN NPH ISOPHANE & REGULAR (70-30) 100 UNIT/ML ~~LOC~~ SUSP: SUBCUTANEOUS | 2 refills | Status: DC

## 2018-05-23 NOTE — Patient Instructions (Addendum)
Goals    . Blood Pressure < 140/90    . HEMOGLOBIN A1C < 8.0    . LDL CALC < 70    . Record weight daily     Call if trending up        Schedule follow up appointment with Dr. Missy Sabins  Decrease insulin to 45 units AM, 30-35 units PM - call if still having low sugars; call if significantly trending up - remember low blood sugar is more dangerous than a high sugar   Ear wax Use a dropper or use a cap to put peroxide, olive oil,mineral oil or canola oil in the effected ear- 2-3 times a week. Let it soak for 20-30 min then you can take a shower or use a baby bulb with warm water to wash out the ear wax.  Do not use Qtips    Preventing Hypoglycemia Hypoglycemia occurs when the level of sugar (glucose) in the blood is too low. Hypoglycemia can happen in people who do or do not have diabetes (diabetes mellitus). It can develop quickly, and it can be a medical emergency. For most people with diabetes, a blood glucose level below 70 mg/dL (3.9 mmol/L) is considered hypoglycemia. Glucose is a type of sugar that provides the body's main source of energy. Certain hormones (insulin and glucagon) control the level of glucose in the blood. Insulin lowers blood glucose, and glucagon increases blood glucose. Hypoglycemia can result from having too much insulin in the bloodstream, or from not eating enough food that contains glucose. Your risk for hypoglycemia is higher:  If you take insulin or diabetes medicines to help lower your blood glucose or help your body make more insulin.  If you skip or delay a meal or snack.  If you are ill.  During and after exercise. You can prevent hypoglycemia by working with your health care provider to adjust your meal plan as needed and by taking other precautions. How can hypoglycemia affect me? Mild symptoms Mild hypoglycemia may not cause any symptoms. If you do have symptoms, they may include:  Hunger.  Anxiety.  Sweating and feeling  clammy.  Dizziness or feeling light-headed.  Sleepiness.  Nausea.  Increased heart rate.  Headache.  Blurry vision.  Irritability.  Tingling or numbness around the mouth, lips, or tongue.  A change in coordination.  Restless sleep. If mild hypoglycemia is not recognized and treated, it can quickly become moderate or severe hypoglycemia. Moderate symptoms Moderate hypoglycemia can cause:  Mental confusion and poor judgment.  Behavior changes.  Weakness.  Irregular heartbeat. Severe symptoms Severe hypoglycemia is a medical emergency. It can cause:  Fainting.  Seizures.  Loss of consciousness (coma).  Death. What nutrition changes can be made?  Work with your health care provider or diet and nutrition specialist (dietitian) to make a healthy meal plan that is right for you. Follow your meal plan carefully.  Eat meals at regular times.  If recommended by your health care provider, have snacks between meals.  Donot skip or delay meals or snacks. You can be at risk for hypoglycemia if you are not getting enough carbohydrates. What lifestyle changes can be made?   Work closely with your health care provider to manage your blood glucose. Make sure you know: ? Your goal blood glucose levels. ? How and when to check your blood glucose. ? The symptoms of hypoglycemia. It is important to treat it right away to keep it from becoming severe.  Do not drink alcohol  on an empty stomach.  When you are ill, check your blood glucose more often than usual. Follow your sick day plan whenever you cannot eat or drink normally. Make this plan in advance with your health care provider.  Always check your blood glucose before, during, and after exercise. How is this treated? This condition can often be treated by immediately eating or drinking something that contains sugar, such as:  Fruit juice, 4-6 oz (120-150 mL).  Regular (not diet) soda, 4-6 oz (120-150 mL).  Low-fat  milk, 4 oz (120 mL).  Several pieces of hard candy.  Sugar or honey, 1 Tbsp (15 mL). Treating hypoglycemia if you have diabetes If you are alert and able to swallow safely, follow the 15:15 rule:  Take 15 grams of a rapid-acting carbohydrate. Talk with your health care provider about how much you should take.  Rapid-acting options include: ? Glucose pills (take 15 grams). ? 6-8 pieces of hard candy. ? 4-6 oz (120-150 mL) of fruit juice. ? 4-6 oz (120-150 mL) of regular (not diet) soda.  Check your blood glucose 15 minutes after you take the carbohydrate.  If the repeat blood glucose level is still at or below 70 mg/dL (3.9 mmol/L), take 15 grams of a carbohydrate again.  If your blood glucose level does not increase above 70 mg/dL (3.9 mmol/L) after 3 tries, seek emergency medical care.  After your blood glucose level returns to normal, eat a meal or a snack within 1 hour. Treating severe hypoglycemia Severe hypoglycemia is when your blood glucose level is at or below 54 mg/dL (3 mmol/L). Severe hypoglycemia is a medical emergency. Get medical help right away. If you have severe hypoglycemia and you cannot eat or drink, you may need an injection of glucagon. A family member or close friend should learn how to check your blood glucose and how to give you a glucagon injection. Ask your health care provider if you need to have an emergency glucagon injection kit available. Severe hypoglycemia may need to be treated in a hospital. The treatment may include getting glucose through an IV. You may also need treatment for the cause of your hypoglycemia. Where to find more information  American Diabetes Association: www.diabetes.[employer] of Diabetes and Digestive and Kidney Diseases: [employer] Contact a health care provider if:  You have problems keeping your blood glucose in your target range.  You have frequent episodes of hypoglycemia. Get help right away  if:  You continue to have hypoglycemia symptoms after eating or drinking something containing glucose.  Your blood glucose level is at or below 54 mg/dL (3 mmol/L).  You faint.  You have a seizure. These symptoms may represent a serious problem that is an emergency. Do not wait to see if the symptoms will go away. Get medical help right away. Call your local emergency services (911 in the [place] Summary  Know the symptoms of hypoglycemia, and when you are at risk for it (such as during exercise or when you are sick). Check your blood glucose often when you are at risk for hypoglycemia.  Hypoglycemia can develop quickly, and it can be dangerous if it is not treated right away. If you have a history of severe hypoglycemia, make sure you know how to use your glucagon injection kit.  Make sure you know how to treat hypoglycemia. Keep a carbohydrate snack available when you may be at risk for hypoglycemia. This information is not intended to replace advice given to you  by your health care provider. Make sure you discuss any questions you have with your health care provider. Document Released: 08/19/2016 Document Revised: 06/14/2017 Document Reviewed: 08/19/2016 Elsevier Interactive Patient Education  2019 [employer].

## 2018-05-24 ENCOUNTER — Other Ambulatory Visit: Payer: Self-pay | Admitting: Adult Health

## 2018-05-24 DIAGNOSIS — E876 Hypokalemia: Secondary | ICD-10-CM

## 2018-05-24 LAB — COMPLETE METABOLIC PANEL WITH GFR
AG Ratio: 1.5 (calc) (ref 1.0–2.5)
ALT: 22 U/L (ref 9–46)
AST: 24 U/L (ref 10–35)
Albumin: 4.6 g/dL (ref 3.6–5.1)
Alkaline phosphatase (APISO): 109 U/L (ref 35–144)
BUN/Creatinine Ratio: 22 (calc) (ref 6–22)
BUN: 48 mg/dL — ABNORMAL HIGH (ref 7–25)
CO2: 31 mmol/L (ref 20–32)
Calcium: 9.6 mg/dL (ref 8.6–10.3)
Chloride: 96 mmol/L — ABNORMAL LOW (ref 98–110)
Creat: 2.19 mg/dL — ABNORMAL HIGH (ref 0.70–1.18)
GFR, Est African American: 32 mL/min/{1.73_m2} — ABNORMAL LOW (ref >=60)
GFR, Est Non African American: 28 mL/min/{1.73_m2} — ABNORMAL LOW (ref >=60)
Globulin: 3 g/dL (calc) (ref 1.9–3.7)
Glucose, Bld: 118 mg/dL — ABNORMAL HIGH (ref 65–99)
Potassium: 3.1 mmol/L — ABNORMAL LOW (ref 3.5–5.3)
Sodium: 141 mmol/L (ref 135–146)
Total Bilirubin: 1 mg/dL (ref 0.2–1.2)
Total Protein: 7.6 g/dL (ref 6.1–8.1)

## 2018-05-24 LAB — CBC WITH DIFFERENTIAL/PLATELET
Absolute Monocytes: 868 cells/uL (ref 200–950)
Basophils Absolute: 62 cells/uL (ref 0–200)
Basophils Relative: 0.5 %
Eosinophils Absolute: 161 cells/uL (ref 15–500)
Eosinophils Relative: 1.3 %
HCT: 36.7 % — ABNORMAL LOW (ref 38.5–50.0)
Hemoglobin: 12.7 g/dL — ABNORMAL LOW (ref 13.2–17.1)
Lymphs Abs: 843 cells/uL — ABNORMAL LOW (ref 850–3900)
MCH: 33.4 pg — ABNORMAL HIGH (ref 27.0–33.0)
MCHC: 34.6 g/dL (ref 32.0–36.0)
MCV: 96.6 fL (ref 80.0–100.0)
MPV: 8.6 fL (ref 7.5–12.5)
Monocytes Relative: 7 %
Neutro Abs: 10466 cells/uL — ABNORMAL HIGH (ref 1500–7800)
Neutrophils Relative %: 84.4 %
Platelets: 246 10*3/uL (ref 140–400)
RBC: 3.8 10*6/uL — ABNORMAL LOW (ref 4.20–5.80)
RDW: 13.9 % (ref 11.0–15.0)
Total Lymphocyte: 6.8 %
WBC: 12.4 10*3/uL — ABNORMAL HIGH (ref 3.8–10.8)

## 2018-05-24 LAB — TSH: TSH: 1.71 mIU/L (ref 0.40–4.50)

## 2018-05-24 LAB — PROTIME-INR
INR: 3 — ABNORMAL HIGH
Prothrombin Time: 29 s — ABNORMAL HIGH (ref 9.0–11.5)

## 2018-05-24 LAB — LIPID PANEL
Cholesterol: 165 mg/dL (ref <200)
HDL: 27 mg/dL — ABNORMAL LOW (ref >=40)
LDL Cholesterol (Calc): 96 mg/dL (calc)
Non-HDL Cholesterol (Calc): 138 mg/dL (calc) — ABNORMAL HIGH (ref <130)
Total CHOL/HDL Ratio: 6.1 (calc) — ABNORMAL HIGH (ref <5.0)
Triglycerides: 316 mg/dL — ABNORMAL HIGH (ref <150)

## 2018-05-24 LAB — HEMOGLOBIN A1C
Hgb A1c MFr Bld: 6.7 % of total Hgb — ABNORMAL HIGH (ref <5.7)
Mean Plasma Glucose: 146 (calc)
eAG (mmol/L): 8.1 (calc)

## 2018-05-24 LAB — MAGNESIUM: Magnesium: 2.5 mg/dL (ref 1.5–2.5)

## 2018-05-24 LAB — URIC ACID: Uric Acid, Serum: 7.1 mg/dL (ref 4.0–8.0)

## 2018-05-24 MED ORDER — ATORVASTATIN CALCIUM 40 MG PO TABS: 20.0000 mg | ORAL_TABLET | Freq: Every day | ORAL | 1 refills | Status: DC

## 2018-05-25 ENCOUNTER — Other Ambulatory Visit: Payer: Self-pay | Admitting: *Deleted

## 2018-06-01 ENCOUNTER — Ambulatory Visit (INDEPENDENT_AMBULATORY_CARE_PROVIDER_SITE_OTHER): Payer: Medicare Other

## 2018-06-01 ENCOUNTER — Other Ambulatory Visit: Payer: Self-pay

## 2018-06-01 DIAGNOSIS — E876 Hypokalemia: Secondary | ICD-10-CM | POA: Diagnosis not present

## 2018-06-01 NOTE — Progress Notes (Signed)
Patient presents to the office for a nurse visit to have labs drawn to check potassium levels. No changes with medications and no questions or concerns. Vitals taken and recorded.

## 2018-06-02 ENCOUNTER — Other Ambulatory Visit: Payer: Self-pay | Admitting: Adult Health

## 2018-06-02 LAB — BASIC METABOLIC PANEL WITH GFR
BUN/Creatinine Ratio: 16 (calc) (ref 6–22)
BUN: 36 mg/dL — ABNORMAL HIGH (ref 7–25)
CO2: 30 mmol/L (ref 20–32)
Calcium: 8.7 mg/dL (ref 8.6–10.3)
Chloride: 105 mmol/L (ref 98–110)
Creat: 2.22 mg/dL — ABNORMAL HIGH (ref 0.70–1.18)
GFR, Est African American: 31 mL/min/{1.73_m2} — ABNORMAL LOW (ref >=60)
GFR, Est Non African American: 27 mL/min/{1.73_m2} — ABNORMAL LOW (ref >=60)
Glucose, Bld: 194 mg/dL — ABNORMAL HIGH (ref 65–99)
Potassium: 4.6 mmol/L (ref 3.5–5.3)
Sodium: 142 mmol/L (ref 135–146)

## 2018-06-02 MED ORDER — POTASSIUM CHLORIDE CRYS ER 20 MEQ PO TBCR: 20.0000 meq | EXTENDED_RELEASE_TABLET | Freq: Every day | ORAL | Status: DC

## 2018-06-06 ENCOUNTER — Other Ambulatory Visit: Payer: Self-pay

## 2018-06-06 MED ORDER — POTASSIUM CHLORIDE CRYS ER 20 MEQ PO TBCR: EXTENDED_RELEASE_TABLET | ORAL | 1 refills | Status: DC

## 2018-06-06 NOTE — Telephone Encounter (Signed)
Potassium chloride has changed to 2 tablets three times daily. New rx requested.

## 2018-06-07 ENCOUNTER — Encounter: Payer: Self-pay | Admitting: Internal Medicine

## 2018-06-08 DIAGNOSIS — G4733 Obstructive sleep apnea (adult) (pediatric): Secondary | ICD-10-CM | POA: Diagnosis not present

## 2018-06-16 DIAGNOSIS — G4733 Obstructive sleep apnea (adult) (pediatric): Secondary | ICD-10-CM | POA: Diagnosis not present

## 2018-06-23 NOTE — Progress Notes (Signed)
Assessment and Plan:  PAF (paroxysmal atrial fibrillation) (HCC) Check INR and will adjust medication according to labs.  Discussed if patient falls to immediately contact office or go to ER. Discussed foods that can increase or decrease Coumadin levels. Patient understands to call the office before starting a new medication. Follow up in one month.   Chronic diastolic congestive heart failure (HCC) Weights up, take PRN metolazone x 1 dose and evaluate weight progress, goal to get 183-184 range; repeat metolazone in 1 week if needed, call if not improving or follow up with Dr. Missy Sabins Emphasized salt restriction, less than 2066m a day. Encouraged daily monitoring of the patient's weight, call office if 5 lb weight loss or gain in a day.  Encouraged regular exercise. If any increasing shortness of breath, swelling, or chest pressure go to ER immediately.  decrease your fluid intake to less than 1.5 L daily please remember to always increase your potassium intake with any increase of your fluid pill.   Hypertension At goal; continue medications Monitor blood pressure at home; call if consistently over 130/80 Continue DASH diet.   Reminder to go to the ER if any CP, SOB, nausea, dizziness, severe HA, changes vision/speech, left arm numbness and tingling and jaw pain.  CKD (chronic kidney disease) stage 3, GFR 30-59 ml/min (HCC) Increase fluids, avoid NSAIDS, monitor sugars, will monitor BMP/GFR  Gout Managed by Dr. AKathlene November has upcoming appointment, requests uric acid be drawn and results forwarded  Further disposition pending results of labs. Discussed med's effects and SE's.   Over 30 minutes of exam, counseling, chart review, and critical decision making was performed.   Future Appointments  Date Time Provider DKidder 07/27/2018 11:15 AM CVicie Mutters PA-C [facility] None  08/04/2018  2:20 PM Bensimhon, DShaune Pascal MD [facility] None  09/02/2018 12:30 PM MHayden Pedro MD  [facility] None  09/08/2018 11:00 AM MUnk Pinto MD [facility] None    ------------------------------------------------------------------------------------------------------------------   HPI BP 106/66   Pulse (!) 51   Temp 97.7 F (36.5 C)   Ht 5' 11"  (1.803 m)   Wt 195 lb (88.5 kg)   SpO2 93%   BMI 27.20 kg/m   78 y.o.male presents for follow up on coumadin for p. A. fib, CHF, htn, CKD III/IV.  His blood pressure has been controlled at home, today their BP is BP: 106/66  He does not workout. He denies chest pain, shortness of breath, dizziness.  He has a history of Diastolic CHF, denies dyspnea on exertion, orthopnea, paroxysmal nocturnal dyspnea and edema. Positive for scant edema. He reports home weight today was 187.4 lb; per Dr. BMissy Sabinsgoal weight is 182-183lb. He is taking torsemide 100 mg in AM and 50 mg PM, and does metolazone 5 mg PRN which he plans to take this afternoon.  Wt Readings from Last 3 Encounters:  06/27/18 195 lb (88.5 kg)  06/01/18 196 lb (88.9 kg)  05/23/18 192 lb (87.1 kg)   Patient is on Coumadin for PAF (paroxysmal atrial fibrillation) ([facility] [I48.0] Patient's last INR is  Lab Results  Component Value Date   INR 3.0 (H) 05/23/2018   INR 2.7 (H) 04/19/2018   INR 3.3 (H) 03/23/2018    Patient denies SOB, CP, dizziness, and blood in stool/urine.  His coumadin dose was not changed last visit. He has not taken ABX, has not missed any doses and denies a fall.    Current dose: 2 tabs daily   Lab Results  Component Value Date  GFRNONAA 27 (L) 06/01/2018   Patient is on allopurinol for gout and does not report a recent flare. Managed by [name] upstairs, has upcoming appointment, requests uric acid be checked and results forwarded:  Lab Results  Component Value Date   LABURIC 7.1 05/23/2018      Past Medical History:  Diagnosis Date  . Adrenal adenoma   . Anemia   . Anxiety   . Arthritis   . Atypical atrial flutter ([place]) 8/15, 10/15    a. DCCV 08/2013. b. s/p RFA 10/2013.  [name] Basal cell carcinoma   . CAD (coronary artery disease)    a. 04/2013 CABG x 2: LIMA to LAD, SVG to RI, EVH via R thigh.  . Cellulitis 12/2015   left leg  . Chronic diastolic congestive heart failure ([place])   . CKD (chronic kidney disease), stage III ([place])   . Depression   . Diabetes mellitus type II   . Diverticulosis 2001  . DJD (degenerative joint disease)   . GERD (gastroesophageal reflux disease)   . Gout   . H/O hiatal hernia   . History of cardioversion    x3 (years uncertain)  . Hx of adenomatous colonic polyps   . Hyperlipidemia   . Hypertension   . Hypertensive cardiomyopathy ([place])   . Obstructive sleep apnea    compliant with CPAP  . 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 Persistent atrial fibrillation    a. s/p MAZE 04/2013 in setting of CABG. b. Amio stopped in 10/2013 after flutter ablation.  [name] PFO (patent foramen ovale)    a. Small PFO by TEE 10/2013.  [name] Pleural effusion, left    a. s/p thoracentesis 05/2013.  [name] Respiratory failure ([place])    a. Hypoxia 10/2013 - required supp O2 as inpatient, did not require it at discharge.  . S/P Maze operation for atrial fibrillation    a. 04/2013: Complete bilateral atrial lesion set using cryothermy and bipolar radiofrequency ablation with clipping of LA appendage (@ time of CABG)  . S/P Maze operation for atrial fibrillation 04/05/2013   Complete bilateral atrial lesion set using cryothermy and bipolar radiofrequency ablation with clipping of LA appendage via median sternotomy approach      Allergies  Allergen Reactions  . Sunflower Seed [Sunflower Oil] Swelling and Other (See Comments)    Tongue and lip swelling  . Horse-Derived Products Other (See Comments)    Per allergy skin test UNSPECIFIED REACTION   . Tetanus Toxoids Other (See Comments)    Per allergy skin test  .  Tetanus Toxoid     Other reaction(s): Other (See Comments) Rash(horse serum)    Current Outpatient Medications on File Prior to Visit  Medication Sig  . ACCU-CHEK AVIVA PLUS test strip CHECK BLOOD GLUCOSE 3 TIMES DAILY.  [name] acetaminophen (TYLENOL) 500 MG tablet Take 1,000 mg by mouth every 6 (six) hours as needed for moderate pain or headache.   . allopurinol (ZYLOPRIM) 300 MG tablet Take 1 tablet daily to prevent gout (Patient taking differently: Take 300 mg by mouth daily. )  . ALPRAZolam (XANAX) 1 MG tablet Take 0.5 tablets (0.5 mg total) by mouth at bedtime as needed for sleep.  [name] Alum Hydroxide-Mag Carbonate (GAVISCON PO) Take 4 tablets by mouth daily as needed (acid reflux).   [name] aspirin EC 81 MG tablet Take 81 mg by mouth daily.  [name] atorvastatin (LIPITOR) 40  MG tablet Take 0.5 tablets (20 mg total) by mouth daily at 6 PM.  . azelastine (ASTELIN) 0.1 % nasal spray PLACE 2 SPRAYS INTO BOTH NOSTRILS 2 (TWO) TIMES DAILY. USE IN EACH NOSTRIL AS DIRECTED (Patient taking differently: Place 2 sprays into both nostrils as needed. Use in each nostril as directed)  . B Complex Vitamins (VITAMIN B COMPLEX PO) Take 1 tablet by mouth at bedtime.   . bacitracin 500 UNIT/GM ointment Apply 1 application topically daily as needed for wound care.  . benzonatate (TESSALON) 200 MG capsule Take 1 capsule (200 mg total) by mouth 3 (three) times daily as needed for cough (Max: 671m per day).  . Blood Glucose Monitoring Suppl (ACCU-CHEK AVIVA PLUS) w/Device KIT Check blood sugar 1 time  daily  . Blood Glucose Monitoring Suppl DEVI Test blood sugar up to three times a day or as directed.  . carvedilol (COREG) 6.25 MG tablet Take 1 tablet 3 x /day for BP & Heart  . Cholecalciferol (VITAMIN D PO) Take 5,000 Units by mouth daily.   . colchicine 0.6 MG tablet Take 0.6 mg by mouth daily as needed (for gout flare up).   . diphenhydrAMINE (BENADRYL) 25 MG tablet Take 25 mg by mouth at bedtime as needed for allergies.    . ferrous sulfate 325 (65 FE) MG tablet Take 325 mg by mouth 2 (two) times daily with a meal.  . fluticasone (FLONASE) 50 MCG/ACT nasal spray Place 1 spray into both nostrils daily as needed for allergies.  .Marland Kitcheninsulin NPH-regular Human (70-30) 100 UNIT/ML injection 45 units AM, 30-35 units PM  . lidocaine (LIDODERM) 5 % Place 1 patch onto the skin daily as needed (for pain). Remove & Discard patch within 12 hours or as directed by MD   . loratadine (CLARITIN) 10 MG tablet Take 10 mg by mouth daily as needed for allergies.   . metolazone (ZAROXOLYN) 5 MG tablet TAKE 1 TABLET BY MOUTH  DAILY AS NEEDED (WEIGHT  GAIN AND EDEMA)  . Multiple Vitamin (MULTIVITAMIN WITH MINERALS) TABS tablet Take 1 tablet by mouth at bedtime.   . Multiple Vitamins-Minerals (PRESERVISION AREDS 2) CAPS Take 1 capsule by mouth 2 (two) times daily.   . polyvinyl alcohol (ARTIFICIAL TEARS) 1.4 % ophthalmic solution Place 1 drop into both eyes daily as needed for dry eyes.  . potassium chloride SA (K-DUR) 20 MEQ tablet Take two tablets, three times a day.  .Marland KitchenPRESCRIPTION MEDICATION Inhale into the lungs at bedtime. CPAP  . sertraline (ZOLOFT) 100 MG tablet TAKE 1 TABLET BY MOUTH  DAILY  . torsemide (DEMADEX) 100 MG tablet TAKE 1 TABLET BY MOUTH IN  THE MORNING AND 1/2 TABLET  IN THE EVENING IF NEEDED  FOR WEIGHT GAIN (Patient taking differently: TAKE 1 TABLET BY MOUTH IN  THE MORNING AND 1/2 TABLET  IN THE AFTERNOON IF NEEDED  FOR WEIGHT GAIN)  . warfarin (COUMADIN) 5 MG tablet Take 1 to 2 tablets daily as directed   No current facility-administered medications on file prior to visit.     ROS: Review of Systems  Constitutional: Negative for malaise/fatigue and weight loss.  HENT: Negative for congestion, hearing loss and tinnitus.   Eyes: Negative for blurred vision and double vision.  Respiratory: Negative for cough, sputum production, shortness of breath and wheezing.   Cardiovascular: Negative for chest pain,  palpitations, orthopnea, claudication, leg swelling and PND.  Gastrointestinal: Negative for abdominal pain, blood in stool, constipation, diarrhea, heartburn, melena, nausea and  vomiting.  Genitourinary: Negative.   Musculoskeletal: Negative for joint pain and myalgias.  Skin: Negative for rash.  Neurological: Negative for dizziness, tingling, sensory change, weakness and headaches.  Endo/Heme/Allergies: Negative for polydipsia. Bruises/bleeds easily.  Psychiatric/Behavioral: Negative.   All other systems reviewed and are negative.    Physical Exam:  BP 106/66   Pulse (!) 51   Temp 97.7 F (36.5 C)   Ht 5' 11"  (1.803 m)   Wt 195 lb (88.5 kg)   SpO2 93%   BMI 27.20 kg/m   General Appearance: Well nourished, in no apparent distress. Eyes: PERRLA, EOMs, conjunctiva no swelling or erythema Sinuses: No Frontal/maxillary tenderness ENT/Mouth: Ext aud canals clear, TMs without erythema, bulging. No erythema, swelling, or exudate on post pharynx.  Tonsils not swollen or erythematous. Hearing normal.  Neck: Supple, thyroid normal.  Respiratory: Respiratory effort normal, BS equal bilaterally without rales, rhonchi, wheezing or stridor.  Cardio: RRR with no MRGs. Brisk peripheral pulses with scant non-pitting edema   Abdomen: Soft, obese/mildly distended, + BS.  Non tender, no guarding, rebound, hernias, masses. Lymphatics: Non tender without lymphadenopathy.  Musculoskeletal: Full ROM, 5/5 strength, normal gait.  Skin: Warm, dry without rashes, lesions; he has fragile skin and numerous small ecchymoses to bilateral upper extremities Neuro: Cranial nerves intact. Normal muscle tone, no cerebellar symptoms. Sensation intact.  Psych: Awake and oriented X 3, normal affect, Insight and Judgment appropriate.     Izora Ribas, NP 11:03 AM [facility]

## 2018-06-27 ENCOUNTER — Encounter: Payer: Self-pay | Admitting: Adult Health

## 2018-06-27 ENCOUNTER — Other Ambulatory Visit: Payer: Self-pay

## 2018-06-27 ENCOUNTER — Ambulatory Visit (INDEPENDENT_AMBULATORY_CARE_PROVIDER_SITE_OTHER): Payer: Medicare Other | Admitting: Adult Health

## 2018-06-27 VITALS — BP 106/66 | HR 51 | Temp 97.7°F | Ht 71.0 in | Wt 195.0 lb

## 2018-06-27 DIAGNOSIS — Z5181 Encounter for therapeutic drug level monitoring: Secondary | ICD-10-CM | POA: Diagnosis not present

## 2018-06-27 DIAGNOSIS — I480 Paroxysmal atrial fibrillation: Secondary | ICD-10-CM

## 2018-06-27 DIAGNOSIS — I10 Essential (primary) hypertension: Secondary | ICD-10-CM

## 2018-06-27 DIAGNOSIS — M1A9XX0 Chronic gout, unspecified, without tophus (tophi): Secondary | ICD-10-CM

## 2018-06-27 DIAGNOSIS — I5032 Chronic diastolic (congestive) heart failure: Secondary | ICD-10-CM | POA: Diagnosis not present

## 2018-06-27 DIAGNOSIS — Z79899 Other long term (current) drug therapy: Secondary | ICD-10-CM

## 2018-06-27 DIAGNOSIS — Z7901 Long term (current) use of anticoagulants: Secondary | ICD-10-CM

## 2018-06-27 NOTE — Patient Instructions (Signed)
Do the following things EVERYDAY: 1) Weigh yourself in the morning before breakfast or at the same time every day. Write it down and keep it in a log. 2) Take your medicines as prescribed 3) Eat low salt foods-Limit salt (sodium) to 2000 mg per day. Best thing to do is avoid processed foods.   4) Stay as active as you can everyday 5) Limit all fluids for the day to less than 1.5 liters  Call your doctor if:  Anytime you have any of the following symptoms:  1) 2 pound weight gain in 24 hours or 5 pounds in 1 week  2) shortness of breath, with or without a dry hacking cough  3) swelling in the hands, LEGs, feet or stomach  4) if you have to sleep on extra pillows at night in order to breathe. 5) after laying down at night for 20-30 mins, you wake up short of breath.   These can all be signs of fluid overload.    Heart Failure Heart failure means your heart has trouble pumping blood. This makes it hard for your body to work well. Heart failure is usually a long-term (chronic) condition. You must take good care of yourself and follow your doctor's treatment plan. Follow these instructions at home:  Take your heart medicine as told by your doctor. ? Do not stop taking medicine unless your doctor tells you to. ? Do not skip any dose of medicine. ? Refill your medicines before they run out. ? Take other medicines only as told by your doctor or pharmacist.  Stay active if told by your doctor. The elderly and people with severe heart failure should talk with a doctor about physical activity.  Eat heart-healthy foods. Choose foods that are without trans fat and are low in saturated fat, cholesterol, and salt (sodium). This includes fresh or frozen fruits and vegetables, fish, lean meats, fat-free or low-fat dairy foods, whole grains, and high-fiber foods. Lentils and dried peas and beans (legumes) are also good choices.  Limit salt if told by your doctor.  Cook in a healthy way. Roast,  grill, broil, bake, poach, steam, or stir-fry foods.  Limit fluids as told by your doctor.  Weigh yourself every morning. Do this after you pee (urinate) and before you eat breakfast. Write down your weight to give to your doctor.  Take your blood pressure and write it down if your doctor tells you to.  Ask your doctor how to check your pulse. Check your pulse as told.  Lose weight if told by your doctor.  Stop smoking or chewing tobacco. Do not use gum or patches that help you quit without your doctor's approval.  Schedule and go to doctor visits as told.  Nonpregnant women should have no more than 1 drink a day. Men should have no more than 2 drinks a day. Talk to your doctor about drinking alcohol.  Stop illegal drug use.  Stay current with shots (immunizations).  Manage your health conditions as told by your doctor.  Learn to manage your stress.  Rest when you are tired.  If it is really hot outside: ? Avoid intense activities. ? Use air conditioning or fans, or get in a cooler place. ? Avoid caffeine and alcohol. ? Wear loose-fitting, lightweight, and light-colored clothing.  If it is really cold outside: ? Avoid intense activities. ? Layer your clothing. ? Wear mittens or gloves, a hat, and a scarf when going outside. ? Avoid alcohol.  Learn about   heart failure and get support as needed.  Get help to maintain or improve your quality of life and your ability to care for yourself as needed. Contact a doctor if:  You gain weight quickly.  You are more short of breath than usual.  You cannot do your normal activities.  You tire easily.  You cough more than normal, especially with activity.  You have any or more puffiness (swelling) in areas such as your hands, feet, ankles, or belly (abdomen).  You cannot sleep because it is hard to breathe.  You feel like your heart is beating fast (palpitations).  You get dizzy or light-headed when you stand up. Get  help right away if:  You have trouble breathing.  There is a change in mental status, such as becoming less alert or not being able to focus.  You have chest pain or discomfort.  You faint. This information is not intended to replace advice given to you by your health care provider. Make sure you discuss any questions you have with your health care provider. Document Released: 10/01/2007 Document Revised: 05/30/2015 Document Reviewed: 02/08/2012 [employer] Interactive Patient Education  2017 [employer]   

## 2018-06-28 LAB — BASIC METABOLIC PANEL WITH GFR
BUN/Creatinine Ratio: 20 (calc) (ref 6–22)
BUN: 38 mg/dL — ABNORMAL HIGH (ref 7–25)
CO2: 29 mmol/L (ref 20–32)
Calcium: 8.8 mg/dL (ref 8.6–10.3)
Chloride: 106 mmol/L (ref 98–110)
Creat: 1.88 mg/dL — ABNORMAL HIGH (ref 0.70–1.18)
GFR, Est African American: 39 mL/min/{1.73_m2} — ABNORMAL LOW (ref >=60)
GFR, Est Non African American: 33 mL/min/{1.73_m2} — ABNORMAL LOW (ref >=60)
Glucose, Bld: 117 mg/dL — ABNORMAL HIGH (ref 65–99)
Potassium: 4.1 mmol/L (ref 3.5–5.3)
Sodium: 143 mmol/L (ref 135–146)

## 2018-06-28 LAB — CBC WITH DIFFERENTIAL/PLATELET
Absolute Monocytes: 714 cells/uL (ref 200–950)
Basophils Absolute: 26 cells/uL (ref 0–200)
Basophils Relative: 0.3 %
Eosinophils Absolute: 163 cells/uL (ref 15–500)
Eosinophils Relative: 1.9 %
HCT: 35.7 % — ABNORMAL LOW (ref 38.5–50.0)
Hemoglobin: 11.9 g/dL — ABNORMAL LOW (ref 13.2–17.1)
Lymphs Abs: 791 cells/uL — ABNORMAL LOW (ref 850–3900)
MCH: 32.5 pg (ref 27.0–33.0)
MCHC: 33.3 g/dL (ref 32.0–36.0)
MCV: 97.5 fL (ref 80.0–100.0)
MPV: 9 fL (ref 7.5–12.5)
Monocytes Relative: 8.3 %
Neutro Abs: 6906 cells/uL (ref 1500–7800)
Neutrophils Relative %: 80.3 %
Platelets: 178 10*3/uL (ref 140–400)
RBC: 3.66 10*6/uL — ABNORMAL LOW (ref 4.20–5.80)
RDW: 13.3 % (ref 11.0–15.0)
Total Lymphocyte: 9.2 %
WBC: 8.6 10*3/uL (ref 3.8–10.8)

## 2018-06-28 LAB — URIC ACID: Uric Acid, Serum: 5.2 mg/dL (ref 4.0–8.0)

## 2018-06-28 LAB — PROTIME-INR
INR: 3 — ABNORMAL HIGH
Prothrombin Time: 29.1 s — ABNORMAL HIGH (ref 9.0–11.5)

## 2018-07-07 DIAGNOSIS — M1990 Unspecified osteoarthritis, unspecified site: Secondary | ICD-10-CM | POA: Diagnosis not present

## 2018-07-07 DIAGNOSIS — N189 Chronic kidney disease, unspecified: Secondary | ICD-10-CM | POA: Diagnosis not present

## 2018-07-07 DIAGNOSIS — M109 Gout, unspecified: Secondary | ICD-10-CM | POA: Diagnosis not present

## 2018-07-07 DIAGNOSIS — M79643 Pain in unspecified hand: Secondary | ICD-10-CM | POA: Diagnosis not present

## 2018-07-07 DIAGNOSIS — M545 Low back pain: Secondary | ICD-10-CM | POA: Diagnosis not present

## 2018-07-17 ENCOUNTER — Other Ambulatory Visit (HOSPITAL_COMMUNITY): Payer: Self-pay | Admitting: Internal Medicine

## 2018-07-23 ENCOUNTER — Other Ambulatory Visit: Payer: Self-pay | Admitting: Internal Medicine

## 2018-07-23 DIAGNOSIS — E1122 Type 2 diabetes mellitus with diabetic chronic kidney disease: Secondary | ICD-10-CM

## 2018-07-23 DIAGNOSIS — Z0001 Encounter for general adult medical examination with abnormal findings: Secondary | ICD-10-CM

## 2018-07-25 NOTE — Progress Notes (Signed)
Assessment and Plan:  Diabetes More erractic blood sugars No evidence of infection ? If kidney function is getting progressively worse and he needs less insulin due to this He is having hypoglycemia so elevated morning sugars may be more samogyi effect. Will cut back on sugars by 2-3 units, keep diary of foods/insulin and sugar Do 2 week phone call Er precautions discussed May benefit from freestyle libre, even if short term to check sugars with hypoglycemia  PAF (paroxysmal atrial fibrillation) ([place]) Check INR and will adjust medication according to labs.  Discussed if patient falls to immediately contact office or go to ER. Discussed foods that can increase or decrease Coumadin levels. Patient understands to call the office before starting a new medication. Follow up in one month.   Chronic diastolic congestive heart failure (HCC) Weights up, take PRN metolazone x 1 dose and evaluate weight progress, goal to get 183-184 range;call if not improving or follow up with Dr. Missy Sabins Emphasized salt restriction, less than 2075m a day. Encouraged daily monitoring of the patient's weight, call office if 5 lb weight loss or gain in a day.  Encouraged regular exercise. If any increasing shortness of breath, swelling, or chest pressure go to ER immediately.  decrease your fluid intake to less than 1.5 L daily please remember to always increase your potassium intake with any increase of your fluid pill.   Hypertension At goal; continue medications Monitor blood pressure at home; call if consistently over 130/80 Continue DASH diet.   Reminder to go to the ER if any CP, SOB, nausea, dizziness, severe HA, changes vision/speech, left arm numbness and tingling and jaw pain.  CKD (chronic kidney disease) stage 3, GFR 30-59 ml/min (HCC) Increase fluids, avoid NSAIDS, monitor sugars, will monitor BMP/GFR   Further disposition pending results of labs. Discussed med's effects and SE's.   Over 30  minutes of exam, counseling, chart review, and critical decision making was performed.   Future Appointments  Date Time Provider [place] 09/02/2018 12:30 PM MHayden Pedro MD [facility] None  09/05/2018  2:40 PM Bensimhon, DShaune Pascal MD [facility] None  09/08/2018 11:00 AM MUnk Pinto MD [facility] None    ------------------------------------------------------------------------------------------------------------------   HPI BP 122/88   Pulse 71   Temp 97.9 F (36.6 C)   Ht _0  (1.803 m)   Wt 189 lb (85.7 kg)   SpO2 98%   BMI 26.36 kg/m   78 y.o.male presents for follow up on coumadin for p. A. fib, CHF, htn, CKD III/IV.  His blood pressure has been controlled at home, today their BP is BP: 122/88  He does not workout. He denies chest pain, shortness of breath, dizziness.  He has a history of Diastolic CHF, denies dyspnea on exertion, orthopnea, paroxysmal nocturnal dyspnea and edema. Positive for scant edema. He reports home weight today was 189 lb; per Dr. BMissy Sabinsgoal weight is 182-183lb. He is taking torsemide 100 mg in AM and 50 mg PM, and does metolazone 5 mg PRN.  Wt Readings from Last 3 Encounters:  07/27/18 189 lb (85.7 kg)  06/27/18 195 lb (88.5 kg)  06/01/18 196 lb (88.9 kg)   Patient is on Coumadin for PAF (paroxysmal atrial fibrillation) (HCC) [I48.0] Patient's last INR is  Lab Results  Component Value Date   INR 3.0 (H) 06/27/2018   INR 3.0 (H) 05/23/2018   INR 2.7 (H) 04/19/2018    Patient denies SOB, CP, dizziness, and blood in stool/urine.  His coumadin dose was  not changed last visit. He has not taken ABX, has not missed any doses and denies a fall.   Current dose: 2 tabs daily or 10 mg daily.    He is only on the 70/30 insulin for his DM due to his kidney function. States that his blood sugar has been abnormal, having swings. He states he is doing 50-53 in the morning based on last nights BS and 40-42 in the evening. States he will wake up  occ [facility], every couple of weeks with sweating and low sugar. More low sugars in the AM. He sleeps until 9-10, takes insulin 30 mins before breakfast and then takes evening insulin around 8pm 30 mins after food. He bases his morning insulin dose off his night time sugar.  Lab Results  Component Value Date   HGBA1C 6.7 (H) 05/23/2018     Lab Results  Component Value Date   GFRNONAA 33 (L) 06/27/2018       Past Medical History:  Diagnosis Date  . Adrenal adenoma   . Anemia   . Anxiety   . Arthritis   . Atypical atrial flutter ([place]) 8/15, 10/15   a. DCCV 08/2013. b. s/p RFA 10/2013.  [name] Basal cell carcinoma   . CAD (coronary artery disease)    a. 04/2013 CABG x 2: LIMA to LAD, SVG to RI, EVH via R thigh.  . Cellulitis 12/2015   left leg  . Chronic diastolic congestive heart failure ([place])   . CKD (chronic kidney disease), stage III ([place])   . Depression   . Diabetes mellitus type II   . Diverticulosis 2001  . DJD (degenerative joint disease)   . GERD (gastroesophageal reflux disease)   . Gout   . H/O hiatal hernia   . History of cardioversion    x3 (years uncertain)  . Hx of adenomatous colonic polyps   . Hyperlipidemia   . Hypertension   . Hypertensive cardiomyopathy ([place])   . Obstructive sleep apnea    compliant with CPAP  . 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 Persistent atrial fibrillation    a. s/p MAZE 04/2013 in setting of CABG. b. Amio stopped in 10/2013 after flutter ablation.  [name] PFO (patent foramen ovale)    a. Small PFO by TEE 10/2013.  [name] Pleural effusion, left    a. s/p thoracentesis 05/2013.  [name] Respiratory failure ([place])    a. Hypoxia 10/2013 - required supp O2 as inpatient, did not require it at discharge.  . S/P Maze operation for atrial fibrillation    a. 04/2013: Complete bilateral atrial lesion set using cryothermy and bipolar radiofrequency  ablation with clipping of LA appendage (@ time of CABG)  . S/P Maze operation for atrial fibrillation 04/05/2013   Complete bilateral atrial lesion set using cryothermy and bipolar radiofrequency ablation with clipping of LA appendage via median sternotomy approach      Allergies  Allergen Reactions  . Sunflower Seed [Sunflower Oil] Swelling and Other (See Comments)    Tongue and lip swelling  . Horse-Derived Products Other (See Comments)    Per allergy skin test UNSPECIFIED REACTION   . Tetanus Toxoids Other (See Comments)    Per allergy skin test  . Tetanus Toxoid     Other reaction(s): Other (See Comments) Rash(horse serum)    Current Outpatient Medications on File Prior to Visit  Medication Sig  . ACCU-CHEK AVIVA  PLUS test strip CHECK BLOOD GLUCOSE 3 TIMES DAILY.  [name] acetaminophen (TYLENOL) 500 MG tablet Take 1,000 mg by mouth every 6 (six) hours as needed for moderate pain or headache.   . ALPRAZolam (XANAX) 1 MG tablet Take 0.5 tablets (0.5 mg total) by mouth at bedtime as needed for sleep.  [name] Alum Hydroxide-Mag Carbonate (GAVISCON PO) Take 4 tablets by mouth daily as needed (acid reflux).   [name] aspirin EC 81 MG tablet Take 81 mg by mouth daily.  [name] atorvastatin (LIPITOR) 40 MG tablet Take 0.5 tablets (20 mg total) by mouth daily at 6 PM.  . B Complex Vitamins (VITAMIN B COMPLEX PO) Take 1 tablet by mouth at bedtime.   . bacitracin 500 UNIT/GM ointment Apply 1 application topically daily as needed for wound care.  . benzonatate (TESSALON) 200 MG capsule Take 1 capsule (200 mg total) by mouth 3 (three) times daily as needed for cough (Max: 618m per day).  . Blood Glucose Monitoring Suppl (ACCU-CHEK AVIVA PLUS) w/Device KIT Check blood sugar 1 time  daily  . Blood Glucose Monitoring Suppl DEVI Test blood sugar up to three times a day or as directed.  . carvedilol (COREG) 6.25 MG tablet Take 1 tablet 3 x /day for BP & Heart  . Cholecalciferol (VITAMIN D PO) Take 5,000 Units by mouth  daily.   . colchicine 0.6 MG tablet Take 0.6 mg by mouth daily as needed (for gout flare up).   . diphenhydrAMINE (BENADRYL) 25 MG tablet Take 25 mg by mouth at bedtime as needed for allergies.   . ferrous sulfate 325 (65 FE) MG tablet Take 325 mg by mouth 2 (two) times daily with a meal.  . fluticasone (FLONASE) 50 MCG/ACT nasal spray Place 1 spray into both nostrils daily as needed for allergies.  .Marland Kitcheninsulin NPH-regular Human (70-30) 100 UNIT/ML injection 45 units AM, 30-35 units PM  . lidocaine (LIDODERM) 5 % Place 1 patch onto the skin daily as needed (for pain). Remove & Discard patch within 12 hours or as directed by MD   . loratadine (CLARITIN) 10 MG tablet Take 10 mg by mouth daily as needed for allergies.   . metolazone (ZAROXOLYN) 5 MG tablet TAKE 1 TABLET BY MOUTH  DAILY AS NEEDED (WEIGHT  GAIN AND EDEMA)  . Multiple Vitamin (MULTIVITAMIN WITH MINERALS) TABS tablet Take 1 tablet by mouth at bedtime.   . Multiple Vitamins-Minerals (PRESERVISION AREDS 2) CAPS Take 1 capsule by mouth 2 (two) times daily.   . polyvinyl alcohol (ARTIFICIAL TEARS) 1.4 % ophthalmic solution Place 1 drop into both eyes daily as needed for dry eyes.  . potassium chloride SA (K-DUR) 20 MEQ tablet Take two tablets, three times a day.  .Marland KitchenPRESCRIPTION MEDICATION Inhale into the lungs at bedtime. CPAP  . sertraline (ZOLOFT) 100 MG tablet TAKE 1 TABLET BY MOUTH  DAILY  . torsemide (DEMADEX) 100 MG tablet TAKE 1 TABLET BY MOUTH IN  THE MORNING AND 1/2 TABLET  IN THE EVENING IF NEEDED  FOR WEIGHT GAIN (Patient taking differently: TAKE 1 TABLET BY MOUTH IN  THE MORNING AND 1/2 TABLET  IN THE AFTERNOON IF NEEDED  FOR WEIGHT GAIN)  . warfarin (COUMADIN) 5 MG tablet Take 1 to 2 tablets daily as directed  . allopurinol (ZYLOPRIM) 300 MG tablet Take 1 tablet daily to prevent gout (Patient taking differently: Take 300 mg by mouth daily. )  . azelastine (ASTELIN) 0.1 % nasal spray PLACE 2 SPRAYS INTO BOTH NOSTRILS 2 (  TWO) TIMES  DAILY. USE IN EACH NOSTRIL AS DIRECTED (Patient taking differently: Place 2 sprays into both nostrils as needed. Use in each nostril as directed)   No current facility-administered medications on file prior to visit.     ROS: Review of Systems  Constitutional: Negative for malaise/fatigue and weight loss.  HENT: Negative for congestion, hearing loss and tinnitus.   Eyes: Negative for blurred vision and double vision.  Respiratory: Negative for cough, sputum production, shortness of breath and wheezing.   Cardiovascular: Negative for chest pain, palpitations, orthopnea, claudication, leg swelling and PND.  Gastrointestinal: Negative for abdominal pain, blood in stool, constipation, diarrhea, heartburn, melena, nausea and vomiting.  Genitourinary: Negative.   Musculoskeletal: Negative for joint pain and myalgias.  Skin: Negative for rash.  Neurological: Negative for dizziness, tingling, sensory change, weakness and headaches.  Endo/Heme/Allergies: Negative for polydipsia. Bruises/bleeds easily.  Psychiatric/Behavioral: Negative.   All other systems reviewed and are negative.    Physical Exam:  BP 122/88   Pulse 71   Temp 97.9 F (36.6 C)   Ht _0  (1.803 m)   Wt 189 lb (85.7 kg)   SpO2 98%   BMI 26.36 kg/m   General Appearance: Well nourished, in no apparent distress. Eyes: PERRLA, EOMs, conjunctiva no swelling or erythema Sinuses: No Frontal/maxillary tenderness ENT/Mouth: Ext aud canals clear, TMs without erythema, bulging. No erythema, swelling, or exudate on post pharynx.  Tonsils not swollen or erythematous. Hearing normal.  Neck: Supple, thyroid normal.  Respiratory: Respiratory effort normal, BS equal bilaterally without rales, rhonchi, wheezing or stridor.  Cardio: RRR with no MRGs. Brisk peripheral pulses with scant non-pitting edema   Abdomen: Soft, obese/mildly distended, + BS.  Non tender, no guarding, rebound, hernias, masses. Lymphatics: Non tender without  lymphadenopathy.  Musculoskeletal: Full ROM, 5/5 strength, normal gait.  Skin: Warm, dry without rashes, lesions; he has fragile skin and numerous small ecchymoses to bilateral upper extremities Neuro: Cranial nerves intact. Normal muscle tone, no cerebellar symptoms. Sensation intact.  Psych: Awake and oriented X 3, normal affect, Insight and Judgment appropriate.     Vicie Mutters, PA-C 11:36 AM [facility]

## 2018-07-27 ENCOUNTER — Ambulatory Visit (INDEPENDENT_AMBULATORY_CARE_PROVIDER_SITE_OTHER): Payer: Medicare Other | Admitting: Physician Assistant

## 2018-07-27 ENCOUNTER — Encounter: Payer: Self-pay | Admitting: Physician Assistant

## 2018-07-27 ENCOUNTER — Other Ambulatory Visit: Payer: Self-pay

## 2018-07-27 VITALS — BP 122/88 | HR 71 | Temp 97.9°F | Ht 71.0 in | Wt 189.0 lb

## 2018-07-27 DIAGNOSIS — D692 Other nonthrombocytopenic purpura: Secondary | ICD-10-CM

## 2018-07-27 DIAGNOSIS — E1122 Type 2 diabetes mellitus with diabetic chronic kidney disease: Secondary | ICD-10-CM | POA: Diagnosis not present

## 2018-07-27 DIAGNOSIS — N184 Chronic kidney disease, stage 4 (severe): Secondary | ICD-10-CM | POA: Diagnosis not present

## 2018-07-27 DIAGNOSIS — N2581 Secondary hyperparathyroidism of renal origin: Secondary | ICD-10-CM

## 2018-07-27 DIAGNOSIS — I480 Paroxysmal atrial fibrillation: Secondary | ICD-10-CM | POA: Diagnosis not present

## 2018-07-27 DIAGNOSIS — I43 Cardiomyopathy in diseases classified elsewhere: Secondary | ICD-10-CM

## 2018-07-27 DIAGNOSIS — I272 Pulmonary hypertension, unspecified: Secondary | ICD-10-CM

## 2018-07-27 DIAGNOSIS — Z794 Long term (current) use of insulin: Secondary | ICD-10-CM

## 2018-07-27 DIAGNOSIS — I4892 Unspecified atrial flutter: Secondary | ICD-10-CM | POA: Diagnosis not present

## 2018-07-27 DIAGNOSIS — I110 Hypertensive heart disease with heart failure: Secondary | ICD-10-CM | POA: Diagnosis not present

## 2018-07-27 DIAGNOSIS — I5032 Chronic diastolic (congestive) heart failure: Secondary | ICD-10-CM

## 2018-07-27 DIAGNOSIS — F3342 Major depressive disorder, recurrent, in full remission: Secondary | ICD-10-CM

## 2018-07-27 DIAGNOSIS — N183 Chronic kidney disease, stage 3 (moderate): Secondary | ICD-10-CM

## 2018-07-27 NOTE — Patient Instructions (Addendum)
We will start you on a dose of morning insulin of 48 in the morning  And a night time insulin of 38 at night   Your A1C is a measure of your sugar over the past 3 months and is not affected by what you have eaten over the past few days. Diabetes increases your chances of stroke and heart attack over 300 % and is the leading cause of blindness and kidney failure in the [place]. Please make sure you decrease bad carbs like white bread, white rice, potatoes, corn, soft drinks, pasta, cereals, refined sugars, sweet tea, dried fruits, and fruit juice. Good carbs are okay to eat in moderation like sweet potatoes, brown rice, whole grain pasta/bread, most fruit (except dried fruit) and you can eat as many veggies as you want.   Greater than 6.5 is considered diabetic. Between 6.4 and 5.7 is prediabetic If your A1C is less than 5.7 you are NOT diabetic.  Targets for Glucose Readings: Time of Check Target for patients WITHOUT Diabetes Target for DIABETICS  Before Meals Less than 100  less than 150  Two hours after meals Less than 200  Less than 250   Somogyi effect The brain needs two things: oxygen and sugar. If the blood sugar level drops too low in the early morning hours, hormones (such as growth hormone, cortisol, and catecholamines) are released to make sure you brain can still function. These help reverse the low blood sugar level but may lead to blood sugar levels that are higher than normal in the morning. This is common for patient that take insulin at night or do not ear regular snacks.  Please schedule to get up in the middle of the night to check your blood sugar.   This may be happening to you. Please eat a high protein night time snack and we will be decreasing your night time insulin as follows:    The 30 of insulin is fast acting and MUST be taken with a meal The 70 of the insulin is an intermediate dose and will last for longer to cover you during the day.   Please check your  sugars at home: check it in in the morning before food Check it before your dinner And for a while check it before bed while we are adjusting your medications  IT IS VERY IMPORTANT TO BRING THIS LOG IN to your office visits.  Please write down the amount of insulin and food log as well to bring in    Your brain needs 2 things, oxygen and sugar, so lets make sure it gets both.  Please remember only take the insulin WITH food   if you are sick or unable to eat DO NOT take your insulin   If at any time you have a question or concern, call the office or message [place] in [place].    Preventing Hypoglycemia Hypoglycemia occurs when the level of sugar (glucose) in the blood is too low. Hypoglycemia can happen in people who do or do not have diabetes (diabetes mellitus). It can develop quickly, and it can be a medical emergency. For most people with diabetes, a blood glucose level below 70 mg/dL (3.9 mmol/L) is considered hypoglycemia. Glucose is a type of sugar that provides the body's main source of energy. Certain hormones (insulin and glucagon) control the level of glucose in the blood. Insulin lowers blood glucose, and glucagon increases blood glucose. Hypoglycemia can result from having too much insulin in the bloodstream,  or from not eating enough food that contains glucose. Your risk for hypoglycemia is higher:  If you take insulin or diabetes medicines to help lower your blood glucose or help your body make more insulin.  If you skip or delay a meal or snack.  If you are ill.  During and after exercise. You can prevent hypoglycemia by working with your health care provider to adjust your meal plan as needed and by taking other precautions. How can hypoglycemia affect me? Mild symptoms Mild hypoglycemia may not cause any symptoms. If you do have symptoms, they may include:  Hunger.  Anxiety.  Sweating and feeling clammy.  Dizziness or feeling  light-headed.  Sleepiness.  Nausea.  Increased heart rate.  Headache.  Blurry vision.  Irritability.  Tingling or numbness around the mouth, lips, or tongue.  A change in coordination.  Restless sleep. If mild hypoglycemia is not recognized and treated, it can quickly become moderate or severe hypoglycemia. Moderate symptoms Moderate hypoglycemia can cause:  Mental confusion and poor judgment.  Behavior changes.  Weakness.  Irregular heartbeat. Severe symptoms Severe hypoglycemia is a medical emergency. It can cause:  Fainting.  Seizures.  Loss of consciousness (coma).  Death. What nutrition changes can be made?  Work with your health care provider or diet and nutrition specialist (dietitian) to make a healthy meal plan that is right for you. Follow your meal plan carefully.  Eat meals at regular times.  If recommended by your health care provider, have snacks between meals.  Donot skip or delay meals or snacks. You can be at risk for hypoglycemia if you are not getting enough carbohydrates. What lifestyle changes can be made?   Work closely with your health care provider to manage your blood glucose. Make sure you know: ? Your goal blood glucose levels. ? How and when to check your blood glucose. ? The symptoms of hypoglycemia. It is important to treat it right away to keep it from becoming severe.  Do not drink alcohol on an empty stomach.  When you are ill, check your blood glucose more often than usual. Follow your sick day plan whenever you cannot eat or drink normally. Make this plan in advance with your health care provider.  Always check your blood glucose before, during, and after exercise. How is this treated? This condition can often be treated by immediately eating or drinking something that contains sugar, such as:  Fruit juice, 4-6 oz (120-150 mL).  Regular (not diet) soda, 4-6 oz (120-150 mL).  Low-fat milk, 4 oz (120 mL).  Several  pieces of hard candy.  Sugar or honey, 1 Tbsp (15 mL). Treating hypoglycemia if you have diabetes If you are alert and able to swallow safely, follow the 15:15 rule:  Take 15 grams of a rapid-acting carbohydrate. Talk with your health care provider about how much you should take.  Rapid-acting options include: ? Glucose pills (take 15 grams). ? 6-8 pieces of hard candy. ? 4-6 oz (120-150 mL) of fruit juice. ? 4-6 oz (120-150 mL) of regular (not diet) soda.  Check your blood glucose 15 minutes after you take the carbohydrate.  If the repeat blood glucose level is still at or below 70 mg/dL (3.9 mmol/L), take 15 grams of a carbohydrate again.  If your blood glucose level does not increase above 70 mg/dL (3.9 mmol/L) after 3 tries, seek emergency medical care.  After your blood glucose level returns to normal, eat a meal or a snack within 1  hour. Treating severe hypoglycemia Severe hypoglycemia is when your blood glucose level is at or below 54 mg/dL (3 mmol/L). Severe hypoglycemia is a medical emergency. Get medical help right away. If you have severe hypoglycemia and you cannot eat or drink, you may need an injection of glucagon. A family member or close friend should learn how to check your blood glucose and how to give you a glucagon injection. Ask your health care provider if you need to have an emergency glucagon injection kit available. Severe hypoglycemia may need to be treated in a hospital. The treatment may include getting glucose through an IV. You may also need treatment for the cause of your hypoglycemia. Where to find more information  American Diabetes Association: www.diabetes.[employer] of Diabetes and Digestive and Kidney Diseases: [employer] Contact a health care provider if:  You have problems keeping your blood glucose in your target range.  You have frequent episodes of hypoglycemia. Get help right away if:  You continue to have  hypoglycemia symptoms after eating or drinking something containing glucose.  Your blood glucose level is at or below 54 mg/dL (3 mmol/L).  You faint.  You have a seizure. These symptoms may represent a serious problem that is an emergency. Do not wait to see if the symptoms will go away. Get medical help right away. Call your local emergency services (911 in the [place] Summary  Know the symptoms of hypoglycemia, and when you are at risk for it (such as during exercise or when you are sick). Check your blood glucose often when you are at risk for hypoglycemia.  Hypoglycemia can develop quickly, and it can be dangerous if it is not treated right away. If you have a history of severe hypoglycemia, make sure you know how to use your glucagon injection kit.  Make sure you know how to treat hypoglycemia. Keep a carbohydrate snack available when you may be at risk for hypoglycemia. This information is not intended to replace advice given to you by your health care provider. Make sure you discuss any questions you have with your health care provider. Document Released: 08/19/2016 Document Revised: 04/15/2018 Document Reviewed: 08/19/2016 Elsevier Patient Education  2020 [employer].

## 2018-07-28 LAB — COMPLETE METABOLIC PANEL WITH GFR
AG Ratio: 1.5 (calc) (ref 1.0–2.5)
ALT: 25 U/L (ref 9–46)
AST: 20 U/L (ref 10–35)
Albumin: 4.5 g/dL (ref 3.6–5.1)
Alkaline phosphatase (APISO): 98 U/L (ref 35–144)
BUN/Creatinine Ratio: 22 (calc) (ref 6–22)
BUN: 60 mg/dL — ABNORMAL HIGH (ref 7–25)
CO2: 36 mmol/L — ABNORMAL HIGH (ref 20–32)
Calcium: 9.8 mg/dL (ref 8.6–10.3)
Chloride: 93 mmol/L — ABNORMAL LOW (ref 98–110)
Creat: 2.69 mg/dL — ABNORMAL HIGH (ref 0.70–1.18)
GFR, Est African American: 25 mL/min/{1.73_m2} — ABNORMAL LOW (ref >=60)
GFR, Est Non African American: 22 mL/min/{1.73_m2} — ABNORMAL LOW (ref >=60)
Globulin: 3 g/dL (calc) (ref 1.9–3.7)
Glucose, Bld: 202 mg/dL — ABNORMAL HIGH (ref 65–99)
Potassium: 3.2 mmol/L — ABNORMAL LOW (ref 3.5–5.3)
Sodium: 140 mmol/L (ref 135–146)
Total Bilirubin: 0.8 mg/dL (ref 0.2–1.2)
Total Protein: 7.5 g/dL (ref 6.1–8.1)

## 2018-07-28 LAB — CBC WITH DIFFERENTIAL/PLATELET
Absolute Monocytes: 714 cells/uL (ref 200–950)
Basophils Absolute: 47 cells/uL (ref 0–200)
Basophils Relative: 0.5 %
Eosinophils Absolute: 132 cells/uL (ref 15–500)
Eosinophils Relative: 1.4 %
HCT: 42.1 % (ref 38.5–50.0)
Hemoglobin: 14.5 g/dL (ref 13.2–17.1)
Lymphs Abs: 733 cells/uL — ABNORMAL LOW (ref 850–3900)
MCH: 33.1 pg — ABNORMAL HIGH (ref 27.0–33.0)
MCHC: 34.4 g/dL (ref 32.0–36.0)
MCV: 96.1 fL (ref 80.0–100.0)
MPV: 9.2 fL (ref 7.5–12.5)
Monocytes Relative: 7.6 %
Neutro Abs: 7774 cells/uL (ref 1500–7800)
Neutrophils Relative %: 82.7 %
Platelets: 203 10*3/uL (ref 140–400)
RBC: 4.38 10*6/uL (ref 4.20–5.80)
RDW: 13.4 % (ref 11.0–15.0)
Total Lymphocyte: 7.8 %
WBC: 9.4 10*3/uL (ref 3.8–10.8)

## 2018-07-28 LAB — TSH: TSH: 1.64 mIU/L (ref 0.40–4.50)

## 2018-07-28 LAB — PROTIME-INR
INR: 2.4 — ABNORMAL HIGH
Prothrombin Time: 23.4 s — ABNORMAL HIGH (ref 9.0–11.5)

## 2018-08-04 ENCOUNTER — Encounter (HOSPITAL_COMMUNITY): Payer: Medicare Other | Admitting: Internal Medicine

## 2018-08-08 NOTE — Progress Notes (Signed)
Assessment and Plan:  Chronic diastolic congestive heart failure (HCC) Weight is up, after reviewing food log, patient is eating ham/bacon/sausage daily, discussed alternative and if he does not want to take the metolazone he needs to control fluid retention with diet LONG discussion about salt/sugar and fluid retention Continue to monitor weight daily Fluid restrict  Type 2 diabetes mellitus with stage 4 chronic kidney disease, with long-term current use of insulin (HCC) -     COMPLETE METABOLIC PANEL WITH GFR -     CBC with Differential/Platelet Sugars  Have improved with decreased insulin, less hypoglycemia and better sugars other than those associated with diet - continue same with the insulin, has follow up in 1 month with Dr. Melford Aase   Future Appointments  Date Time Provider [place]  09/02/2018 12:30 PM Hayden Pedro, MD [facility] None  09/05/2018  2:40 PM Bensimhon, Shaune Pascal, MD [facility] None  09/08/2018 11:00 AM [name], MD [facility] None     HPI 78 y.o.male presents for 2 week follow up for hypoglycemia, abnormal kidney function and CHF.   Last visit he was having high sugars in the morning but occ low sugars at night, due to possibility of samogyi effect, his insulin was cut by 2-3 units, keep diary of foods/insulin and sugar.  He states since his last visit he has only had 2 low sugars, and the highest in the morning was 200 but it has been in the 110-140's.  He has been doing 47/37 units down from 50/53 to 40/42 and his kidney function was worse last visit and will be checked today.  Lab Results  Component Value Date   CREATININE 2.69 (H) 07/27/2018   BUN 60 (H) 07/27/2018   NA 140 07/27/2018   K 3.2 (L) 07/27/2018   CL 93 (L) 07/27/2018   CO2 36 (H) 07/27/2018     Lab Results  Component Value Date   GFRNONAA 22 (L) 07/27/2018   BMI is Body mass index is 27 kg/m., he is working on diet and exercise. Wt Readings from Last 3 Encounters:   08/11/18 193 lb 9.6 oz (87.8 kg)  07/27/18 189 lb (85.7 kg)  06/27/18 195 lb (88.5 kg)        Past Medical History:  Diagnosis Date  . Adrenal adenoma   . Anemia   . Anxiety   . Arthritis   . Atypical atrial flutter ([place]) 8/15, 10/15   a. DCCV 08/2013. b. s/p RFA 10/2013.  [name] Basal cell carcinoma   . CAD (coronary artery disease)    a. 04/2013 CABG x 2: LIMA to LAD, SVG to RI, EVH via R thigh.  . Cellulitis 12/2015   left leg  . Chronic diastolic congestive heart failure ([place])   . CKD (chronic kidney disease), stage III ([place])   . Depression   . Diabetes mellitus type II   . Diverticulosis 2001  . DJD (degenerative joint disease)   . GERD (gastroesophageal reflux disease)   . Gout   . H/O hiatal hernia   . History of cardioversion    x3 (years uncertain)  . Hx of adenomatous colonic polyps   . Hyperlipidemia   . Hypertension   . Hypertensive cardiomyopathy ([place])   . Obstructive sleep apnea    compliant with CPAP  . 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 Persistent atrial fibrillation    a. s/p MAZE 04/2013  in setting of CABG. b. Amio stopped in 10/2013 after flutter ablation.  [name] PFO (patent foramen ovale)    a. Small PFO by TEE 10/2013.  [name] Pleural effusion, left    a. s/p thoracentesis 05/2013.  [name] Respiratory failure ([place])    a. Hypoxia 10/2013 - required supp O2 as inpatient, did not require it at discharge.  . S/P Maze operation for atrial fibrillation    a. 04/2013: Complete bilateral atrial lesion set using cryothermy and bipolar radiofrequency ablation with clipping of LA appendage (@ time of CABG)  . S/P Maze operation for atrial fibrillation 04/05/2013   Complete bilateral atrial lesion set using cryothermy and bipolar radiofrequency ablation with clipping of LA appendage via median sternotomy approach      Allergies  Allergen Reactions  . Sunflower  Seed [Sunflower Oil] Swelling and Other (See Comments)    Tongue and lip swelling  . Horse-Derived Products Other (See Comments)    Per allergy skin test UNSPECIFIED REACTION   . Tetanus Toxoids Other (See Comments)    Per allergy skin test  . Tetanus Toxoid     Other reaction(s): Other (See Comments) Rash(horse serum)    Current Outpatient Medications on File Prior to Visit  Medication Sig  . ACCU-CHEK AVIVA PLUS test strip CHECK BLOOD GLUCOSE 3 TIMES DAILY.  [name] acetaminophen (TYLENOL) 500 MG tablet Take 1,000 mg by mouth every 6 (six) hours as needed for moderate pain or headache.   . ALPRAZolam (XANAX) 1 MG tablet Take 0.5 tablets (0.5 mg total) by mouth at bedtime as needed for sleep.  [name] Alum Hydroxide-Mag Carbonate (GAVISCON PO) Take 4 tablets by mouth daily as needed (acid reflux).   [name] aspirin EC 81 MG tablet Take 81 mg by mouth daily.  [name] atorvastatin (LIPITOR) 40 MG tablet Take 0.5 tablets (20 mg total) by mouth daily at 6 PM.  . B Complex Vitamins (VITAMIN B COMPLEX PO) Take 1 tablet by mouth at bedtime.   . bacitracin 500 UNIT/GM ointment Apply 1 application topically daily as needed for wound care.  . benzonatate (TESSALON) 200 MG capsule Take 1 capsule (200 mg total) by mouth 3 (three) times daily as needed for cough (Max: 622m per day).  . Blood Glucose Monitoring Suppl (ACCU-CHEK AVIVA PLUS) w/Device KIT Check blood sugar 1 time  daily  . Blood Glucose Monitoring Suppl DEVI Test blood sugar up to three times a day or as directed.  . carvedilol (COREG) 6.25 MG tablet Take 1 tablet 3 x /day for BP & Heart  . Cholecalciferol (VITAMIN D PO) Take 5,000 Units by mouth daily.   . colchicine 0.6 MG tablet Take 0.6 mg by mouth daily as needed (for gout flare up).   . diphenhydrAMINE (BENADRYL) 25 MG tablet Take 25 mg by mouth at bedtime as needed for allergies.   . ferrous sulfate 325 (65 FE) MG tablet Take 325 mg by mouth 2 (two) times daily with a meal.  . fluticasone (FLONASE) 50  MCG/ACT nasal spray Place 1 spray into both nostrils daily as needed for allergies.  .Marland Kitcheninsulin NPH-regular Human (70-30) 100 UNIT/ML injection 45 units AM, 30-35 units PM  . lidocaine (LIDODERM) 5 % Place 1 patch onto the skin daily as needed (for pain). Remove & Discard patch within 12 hours or as directed by MD   . loratadine (CLARITIN) 10 MG tablet Take 10 mg by mouth daily as needed for allergies.   . metolazone (ZAROXOLYN) 5 MG tablet TAKE 1  TABLET BY MOUTH  DAILY AS NEEDED (WEIGHT  GAIN AND EDEMA)  . Multiple Vitamin (MULTIVITAMIN WITH MINERALS) TABS tablet Take 1 tablet by mouth at bedtime.   . Multiple Vitamins-Minerals (PRESERVISION AREDS 2) CAPS Take 1 capsule by mouth 2 (two) times daily.   . polyvinyl alcohol (ARTIFICIAL TEARS) 1.4 % ophthalmic solution Place 1 drop into both eyes daily as needed for dry eyes.  . potassium chloride SA (K-DUR) 20 MEQ tablet Take two tablets, three times a day.  [name] PRESCRIPTION MEDICATION Inhale into the lungs at bedtime. CPAP  . sertraline (ZOLOFT) 100 MG tablet TAKE 1 TABLET BY MOUTH  DAILY  . torsemide (DEMADEX) 100 MG tablet TAKE 1 TABLET BY MOUTH IN  THE MORNING AND 1/2 TABLET  IN THE EVENING IF NEEDED  FOR WEIGHT GAIN (Patient taking differently: TAKE 1 TABLET BY MOUTH IN  THE MORNING AND 1/2 TABLET  IN THE AFTERNOON IF NEEDED  FOR WEIGHT GAIN)  . warfarin (COUMADIN) 5 MG tablet Take 1 to 2 tablets daily as directed  . allopurinol (ZYLOPRIM) 300 MG tablet Take 1 tablet daily to prevent gout (Patient taking differently: Take 300 mg by mouth daily. )  . azelastine (ASTELIN) 0.1 % nasal spray PLACE 2 SPRAYS INTO BOTH NOSTRILS 2 (TWO) TIMES DAILY. USE IN EACH NOSTRIL AS DIRECTED (Patient taking differently: Place 2 sprays into both nostrils as needed. Use in each nostril as directed)   No current facility-administered medications on file prior to visit.     ROS: all negative except above.   Physical Exam: Filed Weights   08/11/18 1506  Weight: 193  lb 9.6 oz (87.8 kg)   BP 120/64   Pulse 72   Temp 98.1 F (36.7 C)   Ht _0  (1.803 m)   Wt 193 lb 9.6 oz (87.8 kg)   SpO2 95%   BMI 27.00 kg/m  General Appearance: Well nourished, in no apparent distress. Eyes: PERRLA, EOMs, conjunctiva no swelling or erythema Sinuses: No Frontal/maxillary tenderness ENT/Mouth: Ext aud canals clear, TMs without erythema, bulging. No erythema, swelling, or exudate on post pharynx.  Tonsils not swollen or erythematous. Hearing normal.  Neck: Supple, thyroid normal.  Respiratory: Respiratory effort normal, BS equal bilaterally without rales, rhonchi, wheezing or stridor.  Cardio: RRR with no MRGs. Brisk peripheral pulses with 1+ edema  Abdomen: Soft, + BS.  Non tender, no guarding, rebound, hernias, masses. Lymphatics: Non tender without lymphadenopathy.  Musculoskeletal: Full ROM, 5/5 strength, normal gait.  Skin: Warm, dry without rashes, lesions, ecchymosis.  Neuro: Cranial nerves intact. Normal muscle tone, no cerebellar symptoms. Psych: Awake and oriented X 3, normal affect, Insight and Judgment appropriate.     Vicie Mutters, PA-C 3:15 PM [facility]

## 2018-08-11 ENCOUNTER — Ambulatory Visit (INDEPENDENT_AMBULATORY_CARE_PROVIDER_SITE_OTHER): Payer: Medicare Other | Admitting: Physician Assistant

## 2018-08-11 ENCOUNTER — Other Ambulatory Visit: Payer: Self-pay

## 2018-08-11 ENCOUNTER — Encounter: Payer: Self-pay | Admitting: Physician Assistant

## 2018-08-11 VITALS — BP 120/64 | HR 72 | Temp 98.1°F | Ht 71.0 in | Wt 193.6 lb

## 2018-08-11 DIAGNOSIS — I5032 Chronic diastolic (congestive) heart failure: Secondary | ICD-10-CM

## 2018-08-11 DIAGNOSIS — E1122 Type 2 diabetes mellitus with diabetic chronic kidney disease: Secondary | ICD-10-CM | POA: Diagnosis not present

## 2018-08-11 DIAGNOSIS — N184 Chronic kidney disease, stage 4 (severe): Secondary | ICD-10-CM

## 2018-08-11 DIAGNOSIS — Z794 Long term (current) use of insulin: Secondary | ICD-10-CM

## 2018-08-11 NOTE — Patient Instructions (Signed)
Stop the ham/sausage/bacon in the morning Try sweet potato/kale mixture with eggs  Look for low sodium options, will try to find for you.

## 2018-08-12 LAB — COMPLETE METABOLIC PANEL WITH GFR
AG Ratio: 1.6 (calc) (ref 1.0–2.5)
ALT: 23 U/L (ref 9–46)
AST: 22 U/L (ref 10–35)
Albumin: 4.4 g/dL (ref 3.6–5.1)
Alkaline phosphatase (APISO): 91 U/L (ref 35–144)
BUN/Creatinine Ratio: 23 (calc) — ABNORMAL HIGH (ref 6–22)
BUN: 50 mg/dL — ABNORMAL HIGH (ref 7–25)
CO2: 29 mmol/L (ref 20–32)
Calcium: 9.2 mg/dL (ref 8.6–10.3)
Chloride: 104 mmol/L (ref 98–110)
Creat: 2.17 mg/dL — ABNORMAL HIGH (ref 0.70–1.18)
GFR, Est African American: 32 mL/min/{1.73_m2} — ABNORMAL LOW (ref >=60)
GFR, Est Non African American: 28 mL/min/{1.73_m2} — ABNORMAL LOW (ref >=60)
Globulin: 2.8 g/dL (calc) (ref 1.9–3.7)
Glucose, Bld: 73 mg/dL (ref 65–99)
Potassium: 4.2 mmol/L (ref 3.5–5.3)
Sodium: 142 mmol/L (ref 135–146)
Total Bilirubin: 0.7 mg/dL (ref 0.2–1.2)
Total Protein: 7.2 g/dL (ref 6.1–8.1)

## 2018-08-12 LAB — CBC WITH DIFFERENTIAL/PLATELET
Absolute Monocytes: 713 cells/uL (ref 200–950)
Basophils Absolute: 40 cells/uL (ref 0–200)
Basophils Relative: 0.4 %
Eosinophils Absolute: 149 cells/uL (ref 15–500)
Eosinophils Relative: 1.5 %
HCT: 38.1 % — ABNORMAL LOW (ref 38.5–50.0)
Hemoglobin: 13.3 g/dL (ref 13.2–17.1)
Lymphs Abs: 792 cells/uL — ABNORMAL LOW (ref 850–3900)
MCH: 33.4 pg — ABNORMAL HIGH (ref 27.0–33.0)
MCHC: 34.9 g/dL (ref 32.0–36.0)
MCV: 95.7 fL (ref 80.0–100.0)
MPV: 8.9 fL (ref 7.5–12.5)
Monocytes Relative: 7.2 %
Neutro Abs: 8207 cells/uL — ABNORMAL HIGH (ref 1500–7800)
Neutrophils Relative %: 82.9 %
Platelets: 205 10*3/uL (ref 140–400)
RBC: 3.98 10*6/uL — ABNORMAL LOW (ref 4.20–5.80)
RDW: 13.6 % (ref 11.0–15.0)
Total Lymphocyte: 8 %
WBC: 9.9 10*3/uL (ref 3.8–10.8)

## 2018-08-24 DIAGNOSIS — M79643 Pain in unspecified hand: Secondary | ICD-10-CM | POA: Diagnosis not present

## 2018-08-24 DIAGNOSIS — M79644 Pain in right finger(s): Secondary | ICD-10-CM | POA: Diagnosis not present

## 2018-08-24 DIAGNOSIS — N2581 Secondary hyperparathyroidism of renal origin: Secondary | ICD-10-CM | POA: Diagnosis not present

## 2018-08-24 DIAGNOSIS — M545 Low back pain: Secondary | ICD-10-CM | POA: Diagnosis not present

## 2018-08-24 DIAGNOSIS — N189 Chronic kidney disease, unspecified: Secondary | ICD-10-CM | POA: Diagnosis not present

## 2018-08-24 DIAGNOSIS — I129 Hypertensive chronic kidney disease with stage 1 through stage 4 chronic kidney disease, or unspecified chronic kidney disease: Secondary | ICD-10-CM | POA: Diagnosis not present

## 2018-08-24 DIAGNOSIS — E876 Hypokalemia: Secondary | ICD-10-CM | POA: Diagnosis not present

## 2018-08-24 DIAGNOSIS — M109 Gout, unspecified: Secondary | ICD-10-CM | POA: Diagnosis not present

## 2018-08-24 DIAGNOSIS — N184 Chronic kidney disease, stage 4 (severe): Secondary | ICD-10-CM | POA: Diagnosis not present

## 2018-08-24 DIAGNOSIS — D631 Anemia in chronic kidney disease: Secondary | ICD-10-CM | POA: Diagnosis not present

## 2018-08-24 DIAGNOSIS — M1990 Unspecified osteoarthritis, unspecified site: Secondary | ICD-10-CM | POA: Diagnosis not present

## 2018-09-02 ENCOUNTER — Encounter (INDEPENDENT_AMBULATORY_CARE_PROVIDER_SITE_OTHER): Payer: Medicare Other | Admitting: Ophthalmology

## 2018-09-02 ENCOUNTER — Other Ambulatory Visit: Payer: Self-pay

## 2018-09-02 DIAGNOSIS — H35033 Hypertensive retinopathy, bilateral: Secondary | ICD-10-CM | POA: Diagnosis not present

## 2018-09-02 DIAGNOSIS — H353132 Nonexudative age-related macular degeneration, bilateral, intermediate dry stage: Secondary | ICD-10-CM | POA: Diagnosis not present

## 2018-09-02 DIAGNOSIS — I10 Essential (primary) hypertension: Secondary | ICD-10-CM | POA: Diagnosis not present

## 2018-09-02 DIAGNOSIS — H34832 Tributary (branch) retinal vein occlusion, left eye, with macular edema: Secondary | ICD-10-CM

## 2018-09-02 DIAGNOSIS — H43813 Vitreous degeneration, bilateral: Secondary | ICD-10-CM

## 2018-09-02 DIAGNOSIS — H338 Other retinal detachments: Secondary | ICD-10-CM | POA: Diagnosis not present

## 2018-09-04 NOTE — Progress Notes (Signed)
Patient ID: [name]., male   DOB: [birth year], 78 y.o.   MRN: [id number]   Advanced Heart Failure Clinic Note   Patient ID: [name]., male   DOB: 1940/09/24, 78 y.o.   MRN: [id number]  Primary Cardiologist: [name]  Nephrologist: Carolin Sicks  Subjective: [name] is a 78 y/o male with COPD , DM, PAF, 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, CKD, AFL s/p ablation in 10/15. 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 Had surgery in 4/15. Did well for a while went to cardiac rehab and was feeling fine.  Developed AFL in 10/15 and underwent RFA.   In 3/16 began to develop severe SOB. Started O2. Says his symptoms got worse almost overnight. 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 He has seen Dr. Roxy Manns but felt to have no optimal surgical options for repair. His case was also presented to Dr. Michaelle Birks at [facility] who agreed that there was no way to baffle or reroute the anomalous PV flow to the LA. He had a TEE which showed LVEF 60-65% with a dilated right side and a small PFO. He has also been seen by Dr. Lake Bells who performed PFTs that showed significant restrictive lung disease with a low DLCO. He had f/u with Dr. Gilles Chiquito in the [facility] who felt his symptoms were multifactorial.  Had colonoscopy in 2019 and found to have a polyp and what sounds like AVMs. Subsequently had a laparoscopic removal of appendiceal adenoma which was benign.   Here for routine f/u. Overall doing ok. Hard to exercise with the heat but does ADLs without problem. Minimal edema on left. Stable exertional dyspnea. No orthopnea or PND. Was taking torsemide 100 daily with 50nmg prn in afternoon. But now taking 100/50. Not using metolazone in past few months, Recent creatinine stable.    Echo 12/18 LVEF 60% RV dilated mildly HK  Echo 12/17 LVEF 55-60% RV moderately dilated mild HK. RVSP  24mHG   PFTs (7/16) FEV1 1.45 L (45%) FVC 1.77 L (40%) DLCO 46%  RHC 4/16 RA = 18 RV = 72/4/17 PA = 76/27 (46) PCW = 21 Fick cardiac output/index (using PA sat) = 9.2/4.45 Thermo CO/CI = 10.0/4.87 PVR = 2.2 WU Fick cardiac output/index (using high SVC sat) = 5.2/2.5 Pulse-ox saturation = 89%  High SVC sat = 54% Low SVC sat = 81% (at SVC/RA junction) RA sat = 68% RV sat = 66% PA sat = 68%, 69% IVC sat =56%   ECGO 1/17: EF 60%. RV dilated with moderately reduced systolic function. RVSP 70. + bubble.  VQ/CT negative for PE   TEE 10/15 small PFO  Ab u/s 6/16 liver normal + ascites. Medico renal kidney disease.  Past Medical History:  Diagnosis Date   Adrenal adenoma    Anemia    Anxiety    Arthritis    [place]   a. DCCV 08/2013. b. s/p RFA 10/2013.   Basal cell carcinoma    CAD (coronary artery disease)    a. 04/2013 CABG x 2: LIMA to LAD, SVG to RI, EVH via R thigh.   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 s/p MAZE 04/2013 in setting of CABG. b. Amio stopped in 10/2013 after flutter ablation.   PFO (patent foramen ovale)    a. Small PFO by TEE 10/2013.   Pleural effusion, left    a. s/p thoracentesis 05/2013.   Respiratory failure ([place])    a.  Hypoxia 10/2013 - required supp O2 as inpatient, did not require it at discharge.   S/P Maze operation for atrial fibrillation    a. 04/2013: Complete bilateral atrial lesion set using cryothermy and bipolar radiofrequency ablation with clipping of LA appendage (@ time of CABG)   S/P Maze operation for atrial fibrillation 04/05/2013   Complete bilateral atrial lesion set using cryothermy and bipolar radiofrequency ablation with clipping of LA appendage via median sternotomy approach     Current Outpatient Medications  Medication Sig Dispense Refill   ACCU-CHEK AVIVA PLUS test strip CHECK BLOOD GLUCOSE 3 TIMES DAILY. 300 strip 3   acetaminophen (TYLENOL) 500 MG tablet Take 1,000 mg by mouth every 6 (six) hours as needed for moderate pain or headache.      allopurinol (ZYLOPRIM) 300 MG tablet Take 1 tablet daily to prevent gout (Patient taking differently: Take 300 mg by mouth daily. ) 90 tablet 1   ALPRAZolam (XANAX) 1 MG tablet Take 0.5 tablets (0.5 mg total) by mouth at bedtime as needed for sleep. 60 tablet 0   Alum Hydroxide-Mag Carbonate (GAVISCON PO) Take 4 tablets by mouth daily as needed (acid reflux).      aspirin EC 81 MG tablet Take 81 mg by mouth daily.     atorvastatin (LIPITOR) 40 MG tablet Take 0.5 tablets (20 mg total) by mouth daily at 6 PM. 90 tablet 1   azelastine (ASTELIN) 0.1 % nasal spray PLACE 2 SPRAYS INTO BOTH NOSTRILS 2 (TWO) TIMES DAILY. USE IN EACH NOSTRIL AS DIRECTED (Patient taking differently: Place 2 sprays into both nostrils as needed. Use in each nostril as directed) 90 mL 11   B Complex Vitamins (VITAMIN B COMPLEX PO) Take 1 tablet by mouth at bedtime.      bacitracin 500 UNIT/GM ointment Apply 1 application topically daily as needed for wound care.     benzonatate (TESSALON) 200 MG capsule Take 1 capsule (200 mg total) by mouth 3 (three) times daily as needed for cough (Max: 653m per day). 30 capsule 0   Blood Glucose Monitoring Suppl (ACCU-CHEK  AVIVA PLUS) w/Device KIT Check blood sugar 1 time  daily 1 kit 0   Blood Glucose Monitoring Suppl DEVI Test blood sugar up to three times a day or as directed. 1 each 0   carvedilol (COREG) 6.25 MG tablet Take 1 tablet 3 x /day for BP & Heart 270 tablet 3   Cholecalciferol (VITAMIN D PO) Take 5,000 Units by mouth daily.      colchicine 0.6 MG tablet Take 0.6 mg by mouth daily as needed (for gout  flare up).      diphenhydrAMINE (BENADRYL) 25 MG tablet Take 25 mg by mouth at bedtime as needed for allergies.      ferrous sulfate 325 (65 FE) MG tablet Take 325 mg by mouth 2 (two) times daily with a meal.     fluticasone (FLONASE) 50 MCG/ACT nasal spray Place 1 spray into both nostrils daily as needed for allergies. 16 g 3   insulin NPH-regular Human (70-30) 100 UNIT/ML injection 45 units AM, 30-35 units PM 10 mL 2   lidocaine (LIDODERM) 5 % Place 1 patch onto the skin daily as needed (for pain). Remove & Discard patch within 12 hours or as directed by MD      loratadine (CLARITIN) 10 MG tablet Take 10 mg by mouth daily as needed for allergies.      metolazone (ZAROXOLYN) 5 MG tablet TAKE 1 TABLET BY MOUTH  DAILY AS NEEDED (WEIGHT  GAIN AND EDEMA) 20 tablet 0   Multiple Vitamin (MULTIVITAMIN WITH MINERALS) TABS tablet Take 1 tablet by mouth at bedtime.      Multiple Vitamins-Minerals (PRESERVISION AREDS 2) CAPS Take 1 capsule by mouth 2 (two) times daily.      polyvinyl alcohol (ARTIFICIAL TEARS) 1.4 % ophthalmic solution Place 1 drop into both eyes daily as needed for dry eyes.     potassium chloride SA (K-DUR) 20 MEQ tablet Take two tablets, three times a day. 540 tablet 1   PRESCRIPTION MEDICATION Inhale into the lungs at bedtime. CPAP     sertraline (ZOLOFT) 100 MG tablet TAKE 1 TABLET BY MOUTH  DAILY 90 tablet 3   torsemide (DEMADEX) 100 MG tablet TAKE 1 TABLET BY MOUTH IN  THE MORNING AND 1/2 TABLET  IN THE EVENING IF NEEDED  FOR WEIGHT GAIN (Patient taking differently: TAKE 1  TABLET BY MOUTH IN  THE MORNING AND 1/2 TABLET  IN THE AFTERNOON IF NEEDED  FOR WEIGHT GAIN) 135 tablet 3   warfarin (COUMADIN) 5 MG tablet Take 1 to 2 tablets daily as directed 180 tablet 3   No current facility-administered medications for this encounter.     Allergies  Allergen Reactions   Sunflower Seed [Sunflower Oil] Swelling and Other (See Comments)    Tongue and lip swelling   Horse-Derived Products Other (See Comments)    Per allergy skin test UNSPECIFIED REACTION    Tetanus Toxoids Other (See Comments)    Per allergy skin test   Tetanus Toxoid     Other reaction(s): Other (See Comments) Rash(horse serum)      Social History   Socioeconomic History   Marital status: Married    Spouse name: Not on file   Number of children: 1   Years of education: Not on file   Highest education level: Not on file  Occupational History   Occupation: retired Radio broadcast assistant strain: Not on file   Food insecurity    Worry: Not on file    Inability: Not on file   Transportation needs    Medical: Not on file    Non-medical: Not on file  Tobacco Use   Smoking status: Former Smoker    Packs/day: 4.00    Years: 25.00    Pack years: 100.00    Types: Cigarettes    Quit date: 01/05/1981    Years since quitting: 37.6   Smokeless tobacco: Never Used  Substance and Sexual Activity   Alcohol use: Yes    Alcohol/week: 1.0 standard  drinks    Types: 1 Shots of liquor per week    Comment: 1-5 drinks per week   Drug use: No   Sexual activity: Not on file  Lifestyle   Physical activity    Days per week: Not on file    Minutes per session: Not on file   Stress: Not on file  Relationships   Social connections    Talks on phone: Not on file    Gets together: Not on file    Attends religious service: Not on file    Active member of club or organization: Not on file    Attends meetings of clubs or organizations: Not on file     Relationship status: Not on file   Intimate partner violence    Fear of current or ex partner: Not on file    Emotionally abused: Not on file    Physically abused: Not on file    Forced sexual activity: Not on file  Other Topics Concern   Not on file  Social History Narrative   Daily caffeine-yes   Patient gets regular exercise.   Pt lives in [place] with spouse.  Retired Software engineer.  Family History  Problem Relation Age of Onset   Dementia Father    Colon cancer Mother        Family History/Uncle    Colon polyps Mother        Family History   Atrial fibrillation Mother    Hypertension Mother    Colon polyps Sister        Family history   Diabetes Maternal Uncle    Stroke Paternal Uncle     Vitals:   09/05/18 1439  BP: 104/60  Pulse: 72  SpO2: 96%  Weight: 86.6 kg (191 lb)   Wt Readings  from Last 3 Encounters:  08/11/18 87.8 kg (193 lb 9.6 oz)  07/27/18 85.7 kg (189 lb)  06/27/18 88.5 kg (195 lb)     PHYSICAL EXAM: General:  Well appearing. No resp difficulty HEENT: normal Neck: supple. JVP 5-6. Carotids 2+ bilat; no bruits. No lymphadenopathy or thryomegaly appreciated. Cor: PMI nondisplaced. Regular rate & rhythm. No rubs, gallops or murmurs. Lungs: clear Abdomen: soft, nontender, nondistended. No hepatosplenomegaly. No bruits or masses. Good bowel sounds. Extremities: no cyanosis, clubbing, rash, trace edema on left Neuro: alert & orientedx3, cranial nerves grossly intact. moves all 4 extremities w/o difficulty. Affect pleasant   ASSESSMENT  1. Chronic diastolic HF with R>>L symptoms - Stable NYHA II-III - Volume status looks good.  - Continue torsemide 100/50. Continu metolazone as needed. Have asked him to raise his dry weight a bit and limit metolazone use.  - Recheck labs today - F/u in 6 months with repeat echo   2. RV failure & Pulmonary HTN - Has large left to right shunt through large anomalous pulmonary vein into SVC - Have reviewed with TCTS and Dr. Michaelle Birks at [facility]. No way to repair or baffle. Continue medical therapy  3. CAD s/p CABG - No s/s ischemia.  - Continue statin. Off ASA due to warfarin  4. AF s/p Maze  - s/p ablation 10/15  - In NSR today.   - On coumadin. Has been on DOAC in past and didn't like it because of bleeding  - No recent bleeding. Check CBC today  5. CKD, stage 3-4, baseline creatinine 1.7-1.8 - More recently creatinine has been 2.1-2.6 - Recheck today. - Limit metolazone use  6. Anemia - Up from previous. Now 13.3 - Had colon/EGD in 3/19. Followed by GI  7. RBBB - Stable   Glori Bickers, MD  6:23 PM

## 2018-09-05 ENCOUNTER — Ambulatory Visit (HOSPITAL_COMMUNITY)
Admission: RE | Admit: 2018-09-05 | Discharge: 2018-09-05 | Disposition: A | Discharge status: Home / Self Care | Payer: Medicare Other | Source: Ambulatory Visit | Attending: Internal Medicine | Admitting: Internal Medicine

## 2018-09-05 ENCOUNTER — Encounter (HOSPITAL_COMMUNITY): Payer: Self-pay | Admitting: Internal Medicine

## 2018-09-05 ENCOUNTER — Other Ambulatory Visit: Payer: Self-pay

## 2018-09-05 VITALS — BP 104/60 | HR 72 | Wt 191.0 lb

## 2018-09-05 DIAGNOSIS — Z800 Family history of malignant neoplasm of digestive organs: Secondary | ICD-10-CM | POA: Insufficient documentation

## 2018-09-05 DIAGNOSIS — J449 Chronic obstructive pulmonary disease, unspecified: Secondary | ICD-10-CM | POA: Insufficient documentation

## 2018-09-05 DIAGNOSIS — Z7982 Long term (current) use of aspirin: Secondary | ICD-10-CM | POA: Insufficient documentation

## 2018-09-05 DIAGNOSIS — Z887 Allergy status to serum and vaccine status: Secondary | ICD-10-CM | POA: Diagnosis not present

## 2018-09-05 DIAGNOSIS — Z85828 Personal history of other malignant neoplasm of skin: Secondary | ICD-10-CM | POA: Diagnosis not present

## 2018-09-05 DIAGNOSIS — F329 Major depressive disorder, single episode, unspecified: Secondary | ICD-10-CM | POA: Diagnosis not present

## 2018-09-05 DIAGNOSIS — E785 Hyperlipidemia, unspecified: Secondary | ICD-10-CM | POA: Diagnosis not present

## 2018-09-05 DIAGNOSIS — Z79899 Other long term (current) drug therapy: Secondary | ICD-10-CM | POA: Insufficient documentation

## 2018-09-05 DIAGNOSIS — Z8249 Family history of ischemic heart disease and other diseases of the circulatory system: Secondary | ICD-10-CM | POA: Insufficient documentation

## 2018-09-05 DIAGNOSIS — I272 Pulmonary hypertension, unspecified: Secondary | ICD-10-CM | POA: Diagnosis not present

## 2018-09-05 DIAGNOSIS — Z7901 Long term (current) use of anticoagulants: Secondary | ICD-10-CM | POA: Diagnosis not present

## 2018-09-05 DIAGNOSIS — I130 Hypertensive heart and chronic kidney disease with heart failure and stage 1 through stage 4 chronic kidney disease, or unspecified chronic kidney disease: Secondary | ICD-10-CM | POA: Diagnosis not present

## 2018-09-05 DIAGNOSIS — Z8371 Family history of colonic polyps: Secondary | ICD-10-CM | POA: Insufficient documentation

## 2018-09-05 DIAGNOSIS — G4733 Obstructive sleep apnea (adult) (pediatric): Secondary | ICD-10-CM | POA: Diagnosis not present

## 2018-09-05 DIAGNOSIS — I5032 Chronic diastolic (congestive) heart failure: Secondary | ICD-10-CM | POA: Diagnosis not present

## 2018-09-05 DIAGNOSIS — F419 Anxiety disorder, unspecified: Secondary | ICD-10-CM | POA: Insufficient documentation

## 2018-09-05 DIAGNOSIS — Z951 Presence of aortocoronary bypass graft: Secondary | ICD-10-CM | POA: Diagnosis not present

## 2018-09-05 DIAGNOSIS — I4510 Unspecified right bundle-branch block: Secondary | ICD-10-CM | POA: Diagnosis not present

## 2018-09-05 DIAGNOSIS — I2510 Atherosclerotic heart disease of native coronary artery without angina pectoris: Secondary | ICD-10-CM | POA: Diagnosis not present

## 2018-09-05 DIAGNOSIS — N183 Chronic kidney disease, stage 3 unspecified: Secondary | ICD-10-CM

## 2018-09-05 DIAGNOSIS — I50810 Right heart failure, unspecified: Secondary | ICD-10-CM | POA: Insufficient documentation

## 2018-09-05 DIAGNOSIS — Z794 Long term (current) use of insulin: Secondary | ICD-10-CM | POA: Diagnosis not present

## 2018-09-05 DIAGNOSIS — E1122 Type 2 diabetes mellitus with diabetic chronic kidney disease: Secondary | ICD-10-CM | POA: Diagnosis not present

## 2018-09-05 DIAGNOSIS — I480 Paroxysmal atrial fibrillation: Secondary | ICD-10-CM | POA: Diagnosis not present

## 2018-09-05 DIAGNOSIS — D649 Anemia, unspecified: Secondary | ICD-10-CM | POA: Insufficient documentation

## 2018-09-05 DIAGNOSIS — K219 Gastro-esophageal reflux disease without esophagitis: Secondary | ICD-10-CM | POA: Insufficient documentation

## 2018-09-05 DIAGNOSIS — M1990 Unspecified osteoarthritis, unspecified site: Secondary | ICD-10-CM | POA: Insufficient documentation

## 2018-09-05 DIAGNOSIS — Q211 Atrial septal defect: Secondary | ICD-10-CM | POA: Insufficient documentation

## 2018-09-05 LAB — BASIC METABOLIC PANEL
Anion gap: 12 (ref 5–15)
BUN: 33 mg/dL — ABNORMAL HIGH (ref 8–23)
CO2: 26 mmol/L (ref 22–32)
Calcium: 9 mg/dL (ref 8.9–10.3)
Chloride: 102 mmol/L (ref 98–111)
Creatinine, Ser: 1.94 mg/dL — ABNORMAL HIGH (ref 0.61–1.24)
GFR calc Af Amer: 37 mL/min — ABNORMAL LOW (ref >60)
GFR calc non Af Amer: 32 mL/min — ABNORMAL LOW (ref >60)
Glucose, Bld: 176 mg/dL — ABNORMAL HIGH (ref 70–99)
Potassium: 3.6 mmol/L (ref 3.5–5.1)
Sodium: 140 mmol/L (ref 135–145)

## 2018-09-05 LAB — CBC
HCT: 40.3 % (ref 39.0–52.0)
Hemoglobin: 13.2 g/dL (ref 13.0–17.0)
MCH: 32.8 pg (ref 26.0–34.0)
MCHC: 32.8 g/dL (ref 30.0–36.0)
MCV: 100 fL (ref 80.0–100.0)
Platelets: 189 10*3/uL (ref 150–400)
RBC: 4.03 MIL/uL — ABNORMAL LOW (ref 4.22–5.81)
RDW: 14.6 % (ref 11.5–15.5)
WBC: 9.6 10*3/uL (ref 4.0–10.5)
nRBC: 0 % (ref 0.0–0.2)

## 2018-09-05 LAB — BRAIN NATRIURETIC PEPTIDE: B Natriuretic Peptide: 266.4 pg/mL — ABNORMAL HIGH (ref 0.0–100.0)

## 2018-09-05 NOTE — Patient Instructions (Signed)
Labs done today  We will contact you in 6 months to schedule your next appointment and echocardiogram  At the [facility], you and your health needs are our priority. As part of our continuing mission to provide you with exceptional heart care, we have created designated Provider Care Teams. These Care Teams include your primary Cardiologist (physician) and Advanced Practice Providers (APPs- Physician Assistants and Nurse Practitioners) who all work together to provide you with the care you need, when you need it.   You may see any of the following providers on your designated Care Team at your next follow up: [name] Dr Glori Bickers . Dr Loralie Champagne . Darrick Grinder, NP   Please be sure to bring in all your medications bottles to every appointment.

## 2018-09-05 NOTE — Addendum Note (Signed)
Encounter addended by: Scarlette Calico, RN on: 09/05/2018 3:39 PM  Actions taken: Order list changed, Diagnosis association updated, Charge Capture section accepted, Clinical Note Signed

## 2018-09-07 ENCOUNTER — Encounter: Payer: Self-pay | Admitting: Internal Medicine

## 2018-09-07 NOTE — Patient Instructions (Addendum)
Vit D  & Vit C 1,000 mg   are recommended to help protect  against the Covid-19 and other Corona viruses.    Also it's recommended  to take  Zinc 50 mg  to help  protect against the Covid-19   and best place to get  is also on [employer]  and don't pay more than 6-8 cents /pill !  ================================ Coronavirus (COVID-19) Are you at risk?  Are you at risk for the Coronavirus (COVID-19)?  To be considered HIGH RISK for Coronavirus (COVID-19), you have to meet the following criteria:  . Traveled to [place], [place], [place], [place] or [place]; or in the [place] to [place], [place], [place]  . or [place]; and have fever, cough, and shortness of breath within the last 2 weeks of travel OR . Been in close contact with a person diagnosed with COVID-19 within the last 2 weeks and have  . fever, cough,and shortness of breath .  . IF YOU DO NOT MEET THESE CRITERIA, YOU ARE CONSIDERED LOW RISK FOR COVID-19.  What to do if you are HIGH RISK for COVID-19?  . If you are having a medical emergency, call 911. . Seek medical care right away. Before you go to a doctor's office, urgent care or emergency department, .  call ahead and tell them about your recent travel, contact with someone diagnosed with COVID-19  .  and your symptoms.  . You should receive instructions from your physician's office regarding next steps of care.  . When you arrive at healthcare provider, tell the healthcare staff immediately you have returned from  . visiting [place], [place], [place], [place] or [place]; or traveled in the [place] to [place], [place],  . [place] or [place] in the last two weeks or you have been in close contact with a person diagnosed with  . COVID-19 in the last 2 weeks.   . Tell the health care staff about your symptoms: fever, cough and shortness of breath. . After you have been seen by a medical provider, you will be either: o Tested for (COVID-19) and  discharged home on quarantine except to seek medical care if  o symptoms worsen, and asked to  - Stay home and avoid contact with others until you get your results (4-5 days)  - Avoid travel on public transportation if possible (such as bus, train, or airplane) or o Sent to the Emergency Department by EMS for evaluation, COVID-19 testing  and  o possible admission depending on your condition and test results.  What to do if you are LOW RISK for COVID-19?  Reduce your risk of any infection by using the same precautions used for avoiding the common cold or flu:  . Wash your hands often with soap and warm water for at least 20 seconds.  If soap and water are not readily available,  . use an alcohol-based hand sanitizer with at least 60% alcohol.  . If coughing or sneezing, cover your mouth and nose by coughing or sneezing into the elbow areas of your shirt or coat, .  into a tissue or into your sleeve (not your hands). . Avoid shaking hands with others and consider head nods or verbal greetings only. . Avoid touching your eyes, nose, or mouth with unwashed hands.  . Avoid close contact with people who are sick. . Avoid places or events with large numbers of people in one location, like concerts or sporting events. .   Carefully consider travel plans you have or are making. . If you are planning any travel outside or inside the [place], visit the CDC's Travelers' Health webpage for the latest health notices. . If you have some symptoms but not all symptoms, continue to monitor at home and seek medical attention  . if your symptoms worsen. . If you are having a medical emergency, call 911. >>>>>>>>>>>>>>>>>>>>>>>>>>>>>>>>>>>>>>>>>>>>>>>>>>>>>>> We Do NOT Approve of  [facility] [place] Soliciting Our Patients  To Do Home Visits  & We Do NOT Approve of [employer] > > > > > > > > > > > > > > > > > > > > > > > > > > > > > > > > > > >  > > > >   Preventive Care for Adults  A  healthy lifestyle and preventive care can promote health and wellness. Preventive health guidelines for men include the following key practices:  A routine yearly physical is a good way to check with your health care provider about your health and preventative screening. It is a chance to share any concerns and updates on your health and to receive a thorough exam.  Visit your dentist for a routine exam and preventative care every 6 months. Brush your teeth twice a day and floss once a day. Good oral hygiene prevents tooth decay and gum disease.  The frequency of eye exams is based on your age, health, family medical history, use of contact lenses, and other factors. Follow your health care provider's recommendations for frequency of eye exams.  Eat a healthy diet. Foods such as vegetables, fruits, whole grains, low-fat dairy products, and lean protein foods contain the nutrients you need without too many calories. Decrease your intake of foods high in solid fats, added sugars, and salt. Eat the right amount of calories for you. Get information about a proper diet from your health care provider, if necessary.  Regular physical exercise is one of the most important things you can do for your health. Most adults should get at least 150 minutes of moderate-intensity exercise (any activity that increases your heart rate and causes you to sweat) each week. In addition, most adults need muscle-strengthening exercises on 2 or more days a week.  Maintain a healthy weight. The body mass index (BMI) is a screening tool to identify possible weight problems. It provides an estimate of body fat based on height and weight. Your health care provider can find your BMI and can help you achieve or maintain a healthy weight. For adults 20 years and older:  A BMI below 18.5 is considered underweight.  A BMI of 18.5 to 24.9 is normal.  A BMI of 25 to 29.9 is considered overweight.  A BMI of 30 and above is considered  obese.  Maintain normal blood lipids and cholesterol levels by exercising and minimizing your intake of saturated fat. Eat a balanced diet with plenty of fruit and vegetables. Blood tests for lipids and cholesterol should begin at age 20 and be repeated every 5 years. If your lipid or cholesterol levels are high, you are over 50, or you are at high risk for heart disease, you may need your cholesterol levels checked more frequently. Ongoing high lipid and cholesterol levels should be treated with medicines if diet and exercise are not working.  If you smoke, find out from your health care provider how to quit. If you do not use tobacco, do not start.  Lung cancer   screening is recommended for adults aged 55-80 years who are at high risk for developing lung cancer because of a history of smoking. A yearly low-dose CT scan of the lungs is recommended for people who have at least a 30-pack-year history of smoking and are a current smoker or have quit within the past 15 years. A pack year of smoking is smoking an average of 1 pack of cigarettes a day for 1 year (for example: 1 pack a day for 30 years or 2 packs a day for 15 years). Yearly screening should continue until the smoker has stopped smoking for at least 15 years. Yearly screening should be stopped for people who develop a health problem that would prevent them from having lung cancer treatment.  If you choose to drink alcohol, do not have more than 2 drinks per day. One drink is considered to be 12 ounces (355 mL) of beer, 5 ounces (148 mL) of wine, or 1.5 ounces (44 mL) of liquor.  Avoid use of street drugs. Do not share needles with anyone. Ask for help if you need support or instructions about stopping the use of drugs.  High blood pressure causes heart disease and increases the risk of stroke. Your blood pressure should be checked at least every 1-2 years. Ongoing high blood pressure should be treated with medicines, if weight loss and exercise  are not effective.  If you are 45-79 years old, ask your health care provider if you should take aspirin to prevent heart disease.  Diabetes screening involves taking a blood sample to check your fasting blood sugar level. Testing should be considered at a younger age or be carried out more frequently if you are overweight and have at least 1 risk factor for diabetes.  Colorectal cancer can be detected and often prevented. Most routine colorectal cancer screening begins at the age of 50 and continues through age 75. However, your health care provider may recommend screening at an earlier age if you have risk factors for colon cancer. On a yearly basis, your health care provider may provide home test kits to check for hidden blood in the stool. Use of a small camera at the end of a tube to directly examine the colon (sigmoidoscopy or colonoscopy) can detect the earliest forms of colorectal cancer. Talk to your health care provider about this at age 50, when routine screening begins. Direct exam of the colon should be repeated every 5-10 years through age 75, unless early forms of precancerous polyps or small growths are found.  Hepatitis C blood testing is recommended for all people born from 1945 through 1965 and any individual with known risks for hepatitis C.  Screening for abdominal aortic aneurysm (AAA)  by ultrasound is recommended for people who have history of high blood pressure or who are current or former smokers.  Healthy men should  receive prostate-specific antigen (PSA) blood tests as part of routine cancer screening. Talk with your health care provider about prostate cancer screening.  Testicular cancer screening is  recommended for adult males. Screening includes self-exam, a health care provider exam, and other screening tests. Consult with your health care provider about any symptoms you have or any concerns you have about testicular cancer.  Use sunscreen. Apply sunscreen liberally  and repeatedly throughout the day. You should seek shade when your shadow is shorter than you. Protect yourself by wearing long sleeves, pants, a wide-brimmed hat, and sunglasses year round, whenever you are outdoors.  Once a month,   do a whole-body skin exam, using a mirror to look at the skin on your back. Tell your health care provider about new moles, moles that have irregular borders, moles that are larger than a pencil eraser, or moles that have changed in shape or color.  Stay current with required vaccines (immunizations).  Influenza vaccine. All adults should be immunized every year.  Tetanus, diphtheria, and acellular pertussis (Td, Tdap) vaccine. An adult who has not previously received Tdap or who does not know his vaccine status should receive 1 dose of Tdap. This initial dose should be followed by tetanus and diphtheria toxoids (Td) booster doses every 10 years. Adults with an unknown or incomplete history of completing a 3-dose immunization series with Td-containing vaccines should begin or complete a primary immunization series including a Tdap dose. Adults should receive a Td booster every 10 years.  Zoster vaccine. One dose is recommended for adults aged 60 years or older unless certain conditions are present.    PREVNAR - Pneumococcal 13-valent conjugate (PCV13) vaccine. When indicated, a person who is uncertain of his immunization history and has no record of immunization should receive the PCV13 vaccine. An adult aged 19 years or older who has certain medical conditions and has not been previously immunized should receive 1 dose of PCV13 vaccine. This PCV13 should be followed with a dose of pneumococcal polysaccharide (PPSV23) vaccine. The PPSV23 vaccine dose should be obtained 1 or more year(s)after the dose of PCV13 vaccine. An adult aged 19 years or older who has certain medical conditions and previously received 1 or more doses of PPSV23 vaccine should receive 1 dose of PCV13.  The PCV13 vaccine dose should be obtained 1 or more years after the last PPSV23 vaccine dose.    PNEUMOVAX - Pneumococcal polysaccharide (PPSV23) vaccine. When PCV13 is also indicated, PCV13 should be obtained first. All adults aged 65 years and older should be immunized. An adult younger than age 65 years who has certain medical conditions should be immunized. Any person who resides in a nursing home or long-term care facility should be immunized. An adult smoker should be immunized. People with an immunocompromised condition and certain other conditions should receive both PCV13 and PPSV23 vaccines. People with human immunodeficiency virus (HIV) infection should be immunized as soon as possible after diagnosis. Immunization during chemotherapy or radiation therapy should be avoided. Routine use of PPSV23 vaccine is not recommended for American Indians, [place] Natives, or people younger than 65 years unless there are medical conditions that require PPSV23 vaccine. When indicated, people who have unknown immunization and have no record of immunization should receive PPSV23 vaccine. One-time revaccination 5 years after the first dose of PPSV23 is recommended for people aged 19-64 years who have chronic kidney failure, nephrotic syndrome, asplenia, or immunocompromised conditions. People who received 1-2 doses of PPSV23 before age 65 years should receive another dose of PPSV23 vaccine at age 65 years or later if at least 5 years have passed since the previous dose. Doses of PPSV23 are not needed for people immunized with PPSV23 at or after age 65 years.    Hepatitis A vaccine. Adults who wish to be protected from this disease, have certain high-risk conditions, work with hepatitis A-infected animals, work in hepatitis A research labs, or travel to or work in countries with a high rate of hepatitis A should be immunized. Adults who were previously unvaccinated and who anticipate close contact with an  international adoptee during the first 60 days after arrival in   the [place] from a country with a high rate of hepatitis A should be immunized.    Hepatitis B vaccine. Adults should be immunized if they wish to be protected from this disease, have certain high-risk conditions, may be exposed to blood or other infectious body fluids, are household contacts or sex partners of hepatitis B positive people, are clients or workers in certain care facilities, or travel to or work in countries with a high rate of hepatitis B.   Preventive Service / Frequency   Ages 65 and over  Blood pressure check.  Lipid and cholesterol check.  Lung cancer screening. / Every year if you are aged 55-80 years and have a 30-pack-year history of smoking and currently smoke or have quit within the past 15 years. Yearly screening is stopped once you have quit smoking for at least 15 years or develop a health problem that would prevent you from having lung cancer treatment.  Fecal occult blood test (FOBT) of stool. You may not have to do this test if you get a colonoscopy every 10 years.  Flexible sigmoidoscopy** or colonoscopy.** / Every 5 years for a flexible sigmoidoscopy or every 10 years for a colonoscopy beginning at age 50 and continuing until age 75.  Hepatitis C blood test.** / For all people born from 1945 through 1965 and any individual with known risks for hepatitis C.  Abdominal aortic aneurysm (AAA) screening./ Screening current or former smokers or have Hypertension.  Skin self-exam. / Monthly.  Influenza vaccine. / Every year.  Tetanus, diphtheria, and acellular pertussis (Tdap/Td) vaccine.** / 1 dose of Td every 10 years.   Zoster vaccine.** / 1 dose for adults aged 60 years or older.         Pneumococcal 13-valent conjugate (PCV13) vaccine.    Pneumococcal polysaccharide (PPSV23) vaccine.     Hepatitis A vaccine.** / Consult your health care provider.  Hepatitis B vaccine.** /  Consult your health care provider. Screening for abdominal aortic aneurysm (AAA)  by ultrasound is recommended for people who have history of high blood pressure or who are current or former smokers. ++++++++++ Recommend Adult Low Dose Aspirin or  coated  Aspirin 81 mg daily  To reduce risk of Colon Cancer 40 %,  Skin Cancer 26 % ,  Malignant Melanoma 46%  and  Pancreatic cancer 60% ++++++++++++++++++++++ Vitamin D goal  is between 70-100.  Please make sure that you are taking your Vitamin D as directed.  It is very important as a natural anti-inflammatory  helping hair, skin, and nails, as well as reducing stroke and heart attack risk.  It helps your bones and helps with mood. It also decreases numerous cancer risks so please take it as directed.  Low Vit D is associated with a 200-300% higher risk for CANCER  and 200-300% higher risk for HEART   ATTACK  &  STROKE.   ...................................... It is also associated with higher death rate at younger ages,  autoimmune diseases like Rheumatoid arthritis, Lupus, Multiple Sclerosis.    Also many other serious conditions, like depression, Alzheimer's Dementia, infertility, muscle aches, fatigue, fibromyalgia - just to name a few. ++++++++++++++++++++++ Recommend the book "The END of DIETING" by Dr Joel Fuhrman  & the book "The END of DIABETES " by Dr Joel Fuhrman At [employer]com - get book & Audio CD's    Being diabetic has a  300% increased risk for heart attack, stroke, cancer, and alzheimer- type vascular dementia. It is very important   that you work harder with diet by avoiding all foods that are white. Avoid white rice (brown & wild rice is OK), white potatoes (sweetpotatoes in moderation is OK), White bread or wheat bread or anything made out of white flour like bagels, donuts, rolls, buns, biscuits, cakes, pastries, cookies, pizza crust, and pasta (made from white flour & egg whites) - vegetarian pasta or spinach or wheat  pasta is OK. Multigrain breads like Arnold's or [employer], or multigrain sandwich thins or flatbreads.  Diet, exercise and weight loss can reverse and cure diabetes in the early stages.  Diet, exercise and weight loss is very important in the control and prevention of complications of diabetes which affects every system in your body, ie. Brain - dementia/stroke, eyes - glaucoma/blindness, heart - heart attack/heart failure, kidneys - dialysis, stomach - gastric paralysis, intestines - malabsorption, nerves - severe painful neuritis, circulation - gangrene & loss of a leg(s), and finally cancer and Alzheimers.    I recommend avoid fried & greasy foods,  sweets/candy, white rice (brown or wild rice or Quinoa is OK), white potatoes (sweet potatoes are OK) - anything made from white flour - bagels, doughnuts, rolls, buns, biscuits,white and wheat breads, pizza crust and traditional pasta made of white flour & egg white(vegetarian pasta or spinach or wheat pasta is OK).  Multi-grain bread is OK - like multi-grain flat bread or sandwich thins. Avoid alcohol in excess. Exercise is also important.    Eat all the vegetables you want - avoid meat, especially red meat and dairy - especially cheese.  Cheese is the most concentrated form of trans-fats which is the worst thing to clog up our arteries. Veggie cheese is OK which can be found in the fresh produce section at [employer] or [employer] or [employer]  ++++++++++++++++++++++ DASH Eating Plan  DASH stands for "Dietary Approaches to Stop Hypertension."   The DASH eating plan is a healthy eating plan that has been shown to reduce high blood pressure (hypertension). Additional health benefits may include reducing the risk of type 2 diabetes mellitus, heart disease, and stroke. The DASH eating plan may also help with weight loss. WHAT DO I NEED TO KNOW ABOUT THE DASH EATING PLAN? For the DASH eating plan, you will follow these general  guidelines:  Choose foods with a percent daily value for sodium of less than 5% (as listed on the food label).  Use salt-free seasonings or herbs instead of table salt or sea salt.  Check with your health care provider or pharmacist before using salt substitutes.  Eat lower-sodium products, often labeled as "lower sodium" or "no salt added."  Eat fresh foods.  Eat more vegetables, fruits, and low-fat dairy products.  Choose whole grains. Look for the word "whole" as the first word in the ingredient list.  Choose fish   Limit sweets, desserts, sugars, and sugary drinks.  Choose heart-healthy fats.  Eat veggie cheese   Eat more home-cooked food and less restaurant, buffet, and fast food.  Limit fried foods.  Cook foods using methods other than frying.  Limit canned vegetables. If you do use them, rinse them well to decrease the sodium.  When eating at a restaurant, ask that your food be prepared with less salt, or no salt if possible.                      WHAT FOODS CAN I EAT? Read Dr Joel Fuhrman's books on The End of Dieting &   The End of Diabetes  Grains Whole grain or whole wheat bread. Brown rice. Whole grain or whole wheat pasta. Quinoa, bulgur, and whole grain cereals. Low-sodium cereals. Corn or whole wheat flour tortillas. Whole grain cornbread. Whole grain crackers. Low-sodium crackers.  Vegetables Fresh or frozen vegetables (raw, steamed, roasted, or grilled). Low-sodium or reduced-sodium tomato and vegetable juices. Low-sodium or reduced-sodium tomato sauce and paste. Low-sodium or reduced-sodium canned vegetables.   Fruits All fresh, canned (in natural juice), or frozen fruits.  Protein Products  All fish and seafood.  Dried beans, peas, or lentils. Unsalted nuts and seeds. Unsalted canned beans.  Dairy Low-fat dairy products, such as skim or 1% milk, 2% or reduced-fat cheeses, low-fat ricotta or cottage cheese, or plain low-fat yogurt. Low-sodium or  reduced-sodium cheeses.  Fats and Oils Tub margarines without trans fats. Light or reduced-fat mayonnaise and salad dressings (reduced sodium). Avocado. Safflower, olive, or canola oils. Natural peanut or almond butter.  Other Unsalted popcorn and pretzels. The items listed above may not be a complete list of recommended foods or beverages. Contact your dietitian for more options.  ++++++++++++++++++++  WHAT FOODS ARE NOT RECOMMENDED? Grains/ White flour or wheat flour White bread. White pasta. White rice. Refined cornbread. Bagels and croissants. Crackers that contain trans fat.  Vegetables  Creamed or fried vegetables. Vegetables in a . Regular canned vegetables. Regular canned tomato sauce and paste. Regular tomato and vegetable juices.  Fruits Dried fruits. Canned fruit in light or heavy syrup. Fruit juice.  Meat and Other Protein Products Meat in general - RED meat & White meat.  Fatty cuts of meat. Ribs, chicken wings, all processed meats as bacon, sausage, bologna, salami, fatback, hot dogs, bratwurst and packaged luncheon meats.  Dairy Whole or 2% milk, cream, half-and-half, and cream cheese. Whole-fat or sweetened yogurt. Full-fat cheeses or blue cheese. Non-dairy creamers and whipped toppings. Processed cheese, cheese spreads, or cheese curds.  Condiments Onion and garlic salt, seasoned salt, table salt, and sea salt. Canned and packaged gravies. Worcestershire sauce. Tartar sauce. Barbecue sauce. Teriyaki sauce. Soy sauce, including reduced sodium. Steak sauce. Fish sauce. Oyster sauce. Cocktail sauce. Horseradish. Ketchup and mustard. Meat flavorings and tenderizers. Bouillon cubes. Hot sauce. Tabasco sauce. Marinades. Taco seasonings. Relishes.  Fats and Oils Butter, stick margarine, lard, shortening and bacon fat. Coconut, palm kernel, or palm oils. Regular salad dressings.  Pickles and olives. Salted popcorn and pretzels.  The items listed above may not be a  complete list of foods and beverages to avoid.  ================================= Bleeding Precautions When on Anticoagulant Therapy  Anticoagulant therapy, also called blood thinner therapy, is medicine that helps to prevent and treat blood clots. The medicine works by stopping blood clots from forming or growing. Blood clots that form in your blood vessels can be dangerous. They can break loose and travel to the heart, lungs, or brain. This increases the risk of a heart attack, stroke, or blocked lung artery (pulmonary embolism). Anticoagulants also increase the risk of bleeding. Try to protect yourself from cuts and other injuries that can cause bleeding. It is important to take anticoagulants exactly as told by your health care provider. Why do I need to be on anticoagulant therapy? You may need this medicine if you are at risk of developing a blood clot. Conditions that increase your risk of a blood clot include:  Being born with heart disease or a heart malformation (congenital heart disease).  Developing heart disease.  Having had surgery, such as valve replacement.  Having had a serious accident or other type of severe injury (trauma).  Having certain types of cancer.  Having certain diseases that can increase blood clotting.  Having a high risk of stroke or heart attack.  Having atrial fibrillation (AF). What are the common anticoagulant medicines? There are several types of anticoagulant medicines. The most common types are:  Medicines that you take by mouth (oral medicines), such as: ? Warfarin. ? Novel oral anticoagulants (NOACs), such as: ? Direct thrombin inhibitors (dabigatran). ? Factor Xa inhibitors (apixaban, edoxaban, and rivaroxaban).  Injections, such as: ? Unfractionated heparin. ? Low molecular weight heparin. These anticoagulants work in different ways to prevent blood clots. They also have different risks and side effects. What do I need to remember  while on anticoagulant therapy? Taking anticoagulants  Take your medicine at the same time every day. If you forget to take your medicine, take it as soon as you remember. Do not double your dosage of medicine if you miss a whole day. Take your normal dose and call your health care provider.  Do not stop taking your medicine unless your health care provider approves. Stopping the medicine can increase your risk of developing a blood clot. Taking other medicines  Take over-the-counter and prescriptions medicines only as told by your health care provider.  Do not take over-the-counter NSAIDs, including aspirin and ibuprofen, while you are on anticoagulant therapy. These medicines increase your risk of dangerous bleeding.  Get approval from your health care provider before you start taking any new medicines, vitamins, or herbal products. Some of these could interfere with your therapy. General instructions  Keep all follow-up visits as told by your health care provider. This is important.  If you are pregnant or trying to get pregnant, talk with a health care provider about anticoagulants. Some of these medicines are not safe to take during pregnancy.  Tell all health care providers, including your dentist, that you are on anticoagulant therapy. It is especially important to tell providers before you have any surgery, medical procedures, or dental work done. What precautions should I take?   Be very careful when using knives, scissors, or other sharp objects.  Use an electric razor instead of a blade.  Do not use toothpicks.  Use a soft-bristled toothbrush. Brush your teeth gently.  Always wear shoes outdoors and wear slippers indoors.  Be careful when cutting your fingernails and toenails.  Place bath mats in the bathroom. If possible, install handrails as well.  Wear gloves while you do yard work.  Wear your seat belt.  Prevent falls by removing loose rugs and extension cords  from areas where you walk. Use a cane or walker if you need it.  Avoid constipation by: ? Drinking enough fluid to keep your urine clear or pale yellow. ? Eating foods that are high in fiber, such as fresh fruits and vegetables, whole grains, and beans. ? Limiting foods that are high in fat and processed sugars, such as fried and sweet foods.  Do not play contact sports or participate in other activities that have a high risk for injury. What other precautions are important if on warfarin therapy? If you are taking a type of anticoagulant called warfarin, make sure you:  Work with a diet and nutrition specialist (dietitian) to make an eating plan. Do not make any sudden changes to your diet after you have started your eating plan.  Do not drink alcohol. It can interfere with your medicine and increase your  risk of an injury that causes bleeding.  Get regular blood tests as told by your health care provider. What are some questions to ask my health care provider?  Why do I need anticoagulant therapy?  What is the best anticoagulant therapy for my condition?  How long will I need anticoagulant therapy?  What are the side effects of anticoagulant therapy?  When should I take my medicine? What should I do if I forget to take it?  Will I need to have regular blood tests?  Do I need to change my diet? Are there foods or drinks that I should avoid?  What activities are safe for me?  What should I do if I want to get pregnant? Contact a health care provider if:  You miss a dose of medicine: ? And you are not sure what to do. ? For more than one day.  You have: ? Menstrual bleeding that is heavier than normal. ? Bloody or brown urine. ? Easy bruising. ? Black and tarry stool or bright red stool. ? Side effects from your medicine.  You feel weak or dizzy.  You become pregnant. Get help right away if:  You have bleeding that will not stop within 20 minutes from: ? The  nose. ? The gums. ? A cut on the skin.  You have a severe headache or stomachache.  You vomit or cough up blood.  You fall or hit your head. Summary  Anticoagulant therapy, also called blood thinner therapy, is medicine that helps to prevent and treat blood clots.  Anticoagulants work in different ways to prevent blood clots. They also have different risks and side effects.  Talk with your health care provider about any precautions that you should take while on anticoagulant therapy. This information is not intended to replace advice given to you by your health care provider. Make sure you discuss any questions you have with your health care provider. Document Released: 12/03/2014 Document Revised: 04/13/2018 Document Reviewed: 03/10/2016 Elsevier Patient Education  [place].

## 2018-09-07 NOTE — Progress Notes (Signed)
Annual  Screening/Preventative Visit  & Comprehensive Evaluation & Examination     This very nice 78 y.o. MWM  presents for a Screening /Preventative Visit & comprehensive evaluation and management of multiple medical co-morbidities.  Patient has been followed for HTN, HLD, T2_NIDDM  Prediabetes and Vitamin D Deficiency. Patient's Gout is in control on his Allopurinol. He has OSA & is on CPAP with improved sleep hygiene.      HTN predates since 29. Patient's BP has been controlled at home.  Today's BP is at goal - 122/66.  In 2015, he underwent CABG & MAZE procedure & also has had RFA for Afib & Flutter. He is on Coumadin w/o hx/o bleeding dyscrasia / complications. 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  He reports O2 sats are usually in the mid 90's. Patient denies any cardiac symptoms as chest pain, palpitations, dizziness or ankle swelling. He does admit DOE with moderate activity. Patient has CKD (GFR 39) & is also followed by Dr Lawson Radar / [facility].      Patient's hyperlipidemia is controlled with diet and medications. Patient denies myalgias or other medication SE's. Last lipids were at goal albeit elevated Trig's: Lab Results  Component Value Date   CHOL 165 05/23/2018   HDL 27 (L) 05/23/2018   LDLCALC 96 05/23/2018   TRIG 316 (H) 05/23/2018   CHOLHDL 6.1 (H) 05/23/2018      Patient has hx/o T2_NIDDM since 1995 and has CKD3.  Patient is on bid Nov 70/30 taking 47 u in am and 35 u in pm with am CBG's ranging 90-115 mg% and pm CBG's 150-180 mg%. He  denies reactive hypoglycemic symptoms, visual blurring, diabetic polys or paresthesias. Last A1c was not at goal: Lab Results  Component Value Date   HGBA1C 6.7 (H) 05/23/2018       Finally, patient has history of Vitamin D Deficiency ("39" / 2008) and last vitamin D was at goal: Lab  Results  Component Value Date   VD25OH 86 02/22/2018   Current Outpatient Medications on File Prior to Visit  Medication Sig  . ACCU-CHEK AVIVA PLUS test strip CHECK BLOOD GLUCOSE 3 TIMES DAILY.  [name] acetaminophen (TYLENOL) 500 MG tablet Take 1,000 mg by mouth every 6 (six) hours as needed for moderate pain or headache.   . ALPRAZolam (XANAX) 1 MG tablet Take 0.5 tablets (0.5 mg total) by mouth at bedtime as needed for sleep.  [name] Alum Hydroxide-Mag Carbonate (GAVISCON PO) Take 4 tablets by mouth daily as needed (acid reflux).   [name] aspirin EC 81 MG tablet Take 81 mg by mouth daily.  [name] atorvastatin (LIPITOR) 40 MG tablet Take 0.5 tablets (20 mg total) by mouth daily at 6 PM.  . B Complex Vitamins (VITAMIN B COMPLEX PO) Take 1 tablet by mouth at bedtime.   . bacitracin 500 UNIT/GM ointment Apply 1 application topically daily as needed for wound care.  . benzonatate (TESSALON) 200 MG capsule Take 1 capsule (200 mg total) by mouth 3 (three) times daily as needed for cough (Max: 621m per day).  . Blood Glucose Monitoring Suppl (ACCU-CHEK AVIVA PLUS) w/Device KIT Check blood sugar 1 time  daily  . Blood Glucose Monitoring Suppl DEVI Test blood sugar up to three times a day or as directed.  . carvedilol (COREG) 6.25 MG tablet Take 1 tablet 3 x /day for BP &  Heart  . Cholecalciferol (VITAMIN D PO) Take 5,000 Units by mouth daily.   . colchicine 0.6 MG tablet Take 0.6 mg by mouth daily as needed (for gout flare up).   . diphenhydrAMINE (BENADRYL) 25 MG tablet Take 25 mg by mouth at bedtime as needed for allergies.   . ferrous sulfate 325 (65 FE) MG tablet Take 325 mg by mouth 2 (two) times daily with a meal.  . fluticasone (FLONASE) 50 MCG/ACT nasal spray Place 1 spray into both nostrils daily as needed for allergies.  [name] insulin NPH-regular Human (70-30) 100 UNIT/ML injection 45 units AM, 30-35 units PM  . lidocaine (LIDODERM) 5 % Place 1 patch onto the skin daily as needed (for pain). Remove & Discard  patch within 12 hours or as directed by MD   . loratadine (CLARITIN) 10 MG tablet Take 10 mg by mouth daily as needed for allergies.   . metolazone (ZAROXOLYN) 5 MG tablet TAKE 1 TABLET BY MOUTH  DAILY AS NEEDED (WEIGHT  GAIN AND EDEMA)  . Multiple Vitamins-Minerals (PRESERVISION AREDS 2) CAPS Take 1 capsule by mouth 2 (two) times daily.   . polyvinyl alcohol (ARTIFICIAL TEARS) 1.4 % ophthalmic solution Place 1 drop into both eyes daily as needed for dry eyes.  . potassium chloride SA (K-DUR) 20 MEQ tablet Take two tablets, three times a day.  [name] PRESCRIPTION MEDICATION Inhale into the lungs at bedtime. CPAP  . sertraline (ZOLOFT) 100 MG tablet TAKE 1 TABLET BY MOUTH  DAILY  . torsemide (DEMADEX) 100 MG tablet TAKE 1 TABLET BY MOUTH IN  THE MORNING AND 1/2 TABLET  IN THE EVENING IF NEEDED  FOR WEIGHT GAIN (Patient taking differently: TAKE 1 TABLET BY MOUTH IN  THE MORNING AND 1/2 TABLET  IN THE AFTERNOON IF NEEDED  FOR WEIGHT GAIN)  . warfarin (COUMADIN) 5 MG tablet Take 1 to 2 tablets daily as directed  . allopurinol (ZYLOPRIM) 300 MG tablet Take 1 tablet daily to prevent gout (Patient taking differently: Take 300 mg by mouth daily. )  . azelastine (ASTELIN) 0.1 % nasal spray PLACE 2 SPRAYS INTO BOTH NOSTRILS 2 (TWO) TIMES DAILY. USE IN EACH NOSTRIL AS DIRECTED (Patient taking differently: Place 2 sprays into both nostrils as needed. Use in each nostril as directed)   No current facility-administered medications on file prior to visit.    Allergies  Allergen Reactions  . Sunflower Seed [Sunflower Oil] Swelling and Other (See Comments)    Tongue and lip swelling  . Horse-Derived Products Other (See Comments)    Per allergy skin test UNSPECIFIED REACTION   . Tetanus Toxoids Other (See Comments)    Per allergy skin test  . Tetanus Toxoid     Other reaction(s): Other (See Comments) Rash(horse serum)   Past Medical History:  Diagnosis Date  . Adrenal adenoma   . Anemia   . Anxiety   .  Arthritis   . Atypical atrial flutter ([place]) 8/15, 10/15   a. DCCV 08/2013. b. s/p RFA 10/2013.  [name] Basal cell carcinoma   . CAD (coronary artery disease)    a. 04/2013 CABG x 2: LIMA to LAD, SVG to RI, EVH via R thigh.  . Cellulitis 12/2015   left leg  . Chronic diastolic congestive heart failure ([place])   . CKD (chronic kidney disease), stage III ([place])   . Depression   . Diabetes mellitus type II   . Diverticulosis 2001  . DJD (degenerative joint disease)   . GERD (gastroesophageal  reflux disease)   . Gout   . H/O hiatal hernia   . History of cardioversion    x3 (years uncertain)  . Hx of adenomatous colonic polyps   . Hyperlipidemia   . Hypertension   . Hypertensive cardiomyopathy ([place])   . Obstructive sleep apnea    compliant with CPAP  . 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 Persistent atrial fibrillation    a. s/p MAZE 04/2013 in setting of CABG. b. Amio stopped in 10/2013 after flutter ablation.  [name] PFO (patent foramen ovale)    a. Small PFO by TEE 10/2013.  [name] Pleural effusion, left    a. s/p thoracentesis 05/2013.  [name] Respiratory failure ([place])    a. Hypoxia 10/2013 - required supp O2 as inpatient, did not require it at discharge.  . S/P Maze operation for atrial fibrillation    a. 04/2013: Complete bilateral atrial lesion set using cryothermy and bipolar radiofrequency ablation with clipping of LA appendage (@ time of CABG)  . S/P Maze operation for atrial fibrillation 04/05/2013   Complete bilateral atrial lesion set using cryothermy and bipolar radiofrequency ablation with clipping of LA appendage via median sternotomy approach    Health Maintenance  Topic Date Due  . OPHTHALMOLOGY EXAM  02/04/2018  . URINE MICROALBUMIN  04/22/2018  . INFLUENZA VACCINE  08/06/2018  . HEMOGLOBIN A1C  11/23/2018  . FOOT EXAM  09/08/2019  . TETANUS/TDAP  07/05/2025  . PNA vac Low  Risk Adult  Completed   Immunization History  Administered Date(s) Administered  . DT 08/05/2015  . Influenza Split 10/25/2012  . Influenza, High Dose Seasonal PF 09/26/2013, 09/27/2014, 10/03/2015, 10/05/2016, 11/11/2017  . Pneumococcal Conjugate-13 01/30/2014  . Pneumococcal Polysaccharide-23 10/20/2011  . Td 01/06/2000   Last Colon -  Past Surgical History:  Procedure Laterality Date  . APPENDECTOMY  03/05/2017   laproscopic  . ATRIAL FIBRILLATION ABLATION N/A 10/26/2013   Procedure: ATRIAL FIBRILLATION ABLATION;  Surgeon: Coralyn Mark, MD;  Location: [facility];  Service: Cardiovascular;  Laterality: N/A;  . BASAL CELL CARCINOMA EXCISION     x3 on face  . CARDIAC CATHETERIZATION     myocardial bridge but no cad  . CARDIOVERSION N/A 08/23/2013   Procedure: CARDIOVERSION;  Surgeon: Sanda Klein, MD;  Location: [place] [facility];  Service: Cardiovascular;  Laterality: N/A;  . CARPOMETACARPEL SUSPENSION PLASTY Left 02/14/2014   Procedure: CARPOMETACARPEL ([place]) SUSPENSIONPLASTY THUMB  WITH  ABDUCTOR POLLICIS LONGUS TRANSFER AND STENOSING TENOSYNOVITIS RELEASE LEFT WRIST;  Surgeon: Charlotte Crumb, MD;  Location: [place];  Service: Orthopedics;  Laterality: Left;  . CATARACT EXTRACTION     bilateral  . COLONOSCOPY WITH PROPOFOL N/A 01/26/2017   Procedure: COLONOSCOPY WITH PROPOFOL;  Surgeon: Doran Stabler, MD;  Location: [facility];  Service: Gastroenterology;  Laterality: N/A;  . CORONARY ARTERY BYPASS GRAFT N/A 04/05/2013   Procedure: CORONARY ARTERY BYPASS GRAFTING (CABG) TIMES TWO USING LEFT INTERNAL MAMMARY ARTERY AND RIGHT SAPHENOUS LEG VEIN HARVESTED ENDOSCOPICALLY;  Surgeon: Rexene Alberts, MD;  Location: [facility];  Service: Open Heart Surgery;  Laterality: N/A;  . ESOPHAGOGASTRODUODENOSCOPY (EGD) WITH PROPOFOL N/A 01/26/2017   Procedure: ESOPHAGOGASTRODUODENOSCOPY (EGD) WITH PROPOFOL;  Surgeon: Doran Stabler, MD;  Location: [facility];  Service:  Gastroenterology;  Laterality: N/A;  . GREAT TOE ARTHRODESIS, INTERPHALANGEAL JOINT     Right foot  . INTRAOPERATIVE TRANSESOPHAGEAL ECHOCARDIOGRAM N/A 04/05/2013  Procedure: INTRAOPERATIVE TRANSESOPHAGEAL ECHOCARDIOGRAM;  Surgeon: Rexene Alberts, MD;  Location: [place];  Service: Open Heart Surgery;  Laterality: N/A;  . LAPAROSCOPIC APPENDECTOMY N/A 03/05/2017   Procedure: APPENDECTOMY LAPAROSCOPIC;  Surgeon: Judeth Horn, MD;  Location: [place];  Service: General;  Laterality: N/A;  . LEFT HEART CATHETERIZATION WITH CORONARY ANGIOGRAM N/A 03/07/2013   Procedure: LEFT HEART CATHETERIZATION WITH CORONARY ANGIOGRAM;  Surgeon: Burnell Blanks, MD;  Location: [facility] C[facility] LAB;  Service: Cardiovascular;  Laterality: N/A;  . MAZE N/A 04/05/2013   Procedure: MAZE;  Surgeon: Rexene Alberts, MD;  Location: [place];  Service: Open Heart Surgery;  Laterality: N/A;  . Polinydal cyst     Removed  . POLYPECTOMY    . Retina repair-right    . RIGHT HEART CATHETERIZATION N/A 05/03/2014   Procedure: RIGHT HEART CATH;  Surgeon: Jolaine Artist, MD;  Location: [facility] C[facility] LAB;  Service: Cardiovascular;  Laterality: N/A;  . TEE WITHOUT CARDIOVERSION N/A 08/23/2013   Procedure: TRANSESOPHAGEAL ECHOCARDIOGRAM (TEE);  Surgeon: Sanda Klein, MD;  Location: [facility] ENDOSCOPY;  Service: Cardiovascular;  Laterality: N/A;  . TEE WITHOUT CARDIOVERSION N/A 10/26/2013   Procedure: TRANSESOPHAGEAL ECHOCARDIOGRAM (TEE);  Surgeon: Sueanne Margarita, MD;  Location: [facility] ENDOSCOPY;  Service: Cardiovascular;  Laterality: N/A;  . TEE WITHOUT CARDIOVERSION N/A 06/05/2014   Procedure: TRANSESOPHAGEAL ECHOCARDIOGRAM (TEE);  Surgeon: Thayer Headings, MD;  Location: [facility] ENDOSCOPY;  Service: Cardiovascular;  Laterality: N/A;  . TRAPEZIUM RESECTION     Family History  Problem Relation Age of Onset  . Dementia Father   . Colon cancer Mother        Family History/Uncle   . Colon polyps Mother        Family History  . Atrial fibrillation Mother   .  Hypertension Mother   . Colon polyps Sister        Family history  . Diabetes Maternal Uncle   . Stroke Paternal Uncle    Social History   Socioeconomic History  . Marital status: Married    Spouse name: Not on file  . Number of children: 1  Occupational History  . Occupation: retired Software engineer  Tobacco Use  . Smoking status: Former Smoker    Packs/day: 4.00    Years: 25.00    Pack years: 100.00    Types: Cigarettes    Quit date: 01/05/1981    Years since quitting: 37.6  . Smokeless tobacco: Never Used  Substance and Sexual Activity  . Alcohol use: Yes    Alcohol/week: 1.0 standard drinks    Types: 1 Shots of liquor per week    Comment: 1-5 drinks per week  . Drug use: No  . Sexual activity: Not on file  Social History Narrative   Daily caffeine-yes   Patient gets regular exercise.   Pt lives in [place] with spouse.  Retired Software engineer                          ROS Constitutional: Denies fever, chills, weight loss/gain, headaches, insomnia,  night sweats or change in appetite. Does c/o fatigue. Eyes: Denies redness, blurred vision, diplopia, discharge, itchy or watery eyes.  ENT: Denies discharge, congestion, post nasal drip, epistaxis, sore throat, earache, hearing loss, dental pain, Tinnitus, Vertigo, Sinus pain or snoring.  Cardio: Denies chest pain, palpitations, irregular heartbeat, syncope, dyspnea, diaphoresis, orthopnea, PND, claudication or edema Respiratory: denies cough, dyspnea, DOE, pleurisy, hoarseness, laryngitis or wheezing.  Gastrointestinal: Denies dysphagia, heartburn, reflux,  water brash, pain, cramps, nausea, vomiting, bloating, diarrhea, constipation, hematemesis, melena, hematochezia, jaundice or hemorrhoids Genitourinary: Denies dysuria, frequency, discharge, hematuria or flank pain. Has urgency, nocturia x 2-3 & occasional hesitancy. Musculoskeletal: Denies arthralgia, myalgia, stiffness, Jt. Swelling, pain, limp or strain/sprain. Denies Falls.  Skin: Denies puritis, rash, hives, warts, acne, eczema or change in skin lesion Neuro: No weakness, tremor, incoordination, spasms, paresthesia or pain Psychiatric: Denies confusion, memory loss or sensory loss. Denies Depression. Endocrine: Denies change in weight, skin, hair change, nocturia, and paresthesia, diabetic polys, visual blurring or hyper / hypo glycemic episodes.  Heme/Lymph: No excessive bleeding, bruising or enlarged lymph nodes.  Physical Exam  BP 122/66   Pulse 60   Temp 97.6 F (36.4 C)   Resp 16   Ht _0  (1.803 m)   Wt 194 lb 9.6 oz (88.3 kg)   BMI 27.14 kg/m   General Appearance: Well nourished and well groomed and in no apparent distress.  Eyes: PERRLA, EOMs, conjunctiva no swelling or erythema, normal fundi and vessels. Sinuses: No frontal/maxillary tenderness ENT/Mouth: EACs patent / TMs  nl. Nares clear without erythema, swelling, mucoid exudates. Oral hygiene is good. No erythema, swelling, or exudate. Tongue normal, non-obstructing. Tonsils not swollen or erythematous. Hearing normal.  Neck: Supple, thyroid not palpable. No bruits, nodes or JVD. Respiratory: Respiratory effort normal.  BS equal and clear bilateral without rales, rhonci, wheezing or stridor. Cardio: Heart sounds are normal with regular rate and rhythm and no murmurs, rubs or gallops. Peripheral pulses are normal and equal bilaterally without edema. No aortic or femoral bruits. Chest: symmetric with normal excursions and percussion.  Abdomen: Soft, with Nl bowel sounds. Nontender, no guarding, rebound, hernias, masses, or organomegaly.  Lymphatics: Non tender without lymphadenopathy.  Musculoskeletal: Full ROM all peripheral extremities, joint stability, 5/5 strength, and normal gait. Skin: Warm and dry without rashes, lesions, cyanosis, clubbing or  ecchymosis.  Neuro: Cranial nerves intact, reflexes equal bilaterally. Normal muscle tone, no cerebellar symptoms. Sensation intact to touch,  vibratory and Monofilament to the toes bilaterally. Pysch: Alert and oriented X 3 with normal affect, insight and judgment appropriate.   Assessment and Plan  1. Annual Preventative/Screening Exam   2. Essential hypertension  - EKG 12-Lead - [place], RETROPERITNL ABD,  LTD - Urinalysis, Routine w reflex microscopic - Microalbumin / creatinine urine ratio - CBC with Differential/Platelet - COMPLETE METABOLIC PANEL WITH GFR - Magnesium - TSH  3. Hyperlipidemia associated with type 2 diabetes mellitus ([place])  - EKG 12-Lead - [place], RETROPERITNL ABD,  LTD - Lipid panel - TSH  4. Type 2 diabetes mellitus with stage 4 chronic kidney disease, with long-term current use of insulin (HCC)  - EKG 12-Lead - [place], RETROPERITNL ABD,  LTD - Urinalysis, Routine w reflex microscopic - Microalbumin / creatinine urine ratio - HM DIABETES FOOT EXAM - LOW EXTREMITY NEUR EXAM DOCUM - PTH, intact and calcium - Hemoglobin A1c - Insulin, random  5. Vitamin D deficiency  - VITAMIN D 25 Hydroxy  6. Secondary hyperparathyroidism, renal (HCC)  - PTH, intact and calcium  7. 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  - EKG 12-Lead - Lipid panel  8. Cardiomyopathy due to hypertension, with heart failure (HCC)  - EKG 12-Lead  9. Pulmonary hypertension (HCC)  - EKG 12-Lead  10. Gout, arthropathy  - Uric acid  11. BPH with obstruction/lower urinary tract symptoms  - PSA  12. Prostate cancer screening  - PSA  13. Chronic anticoagulation  -  Protime-INR  14. Screening for colorectal cancer  - POC Hemoccult Bld/Stl  15. Screening for ischemic heart disease  - EKG 12-Lead - [place], RETROPERITNL ABD,  LTD  16. FH: heart disease  - EKG 12-Lead - [place], RETROPERITNL ABD,  LTD  17. Former smoker  - EKG 12-Lead - [place], RETROPERITNL ABD,  LTD  18. Screening for AAA (aortic abdominal aneurysm)  - [place], RETROPERITNL ABD,  LTD  19. Encounter for monitoring Coumadin therapy  -  Protime-INR  20. Medication management  - Urinalysis, Routine w reflex microscopic - Microalbumin / creatinine urine ratio - CBC with Differential/Platelet - COMPLETE METABOLIC PANEL WITH GFR - Magnesium - Lipid panel - TSH - Hemoglobin A1c - Insulin, random - VITAMIN D 25 Hydroxl       Patient was counseled in prudent diet, weight control to achieve/maintain BMI less than 25, BP monitoring, regular exercise and medications as discussed.  Discussed med effects and SE's. Routine screening labs and tests as requested with regular follow-up as recommended. Over 40 minutes of exam, counseling, chart review and high complex critical decision making was performed   Kirtland Bouchard, MD

## 2018-09-08 ENCOUNTER — Other Ambulatory Visit: Payer: Self-pay

## 2018-09-08 ENCOUNTER — Ambulatory Visit (INDEPENDENT_AMBULATORY_CARE_PROVIDER_SITE_OTHER): Payer: Medicare Other | Admitting: Internal Medicine

## 2018-09-08 VITALS — BP 122/66 | HR 60 | Temp 97.6°F | Resp 16 | Ht 71.0 in | Wt 194.6 lb

## 2018-09-08 DIAGNOSIS — Z79899 Other long term (current) drug therapy: Secondary | ICD-10-CM

## 2018-09-08 DIAGNOSIS — N2581 Secondary hyperparathyroidism of renal origin: Secondary | ICD-10-CM | POA: Diagnosis not present

## 2018-09-08 DIAGNOSIS — Z87891 Personal history of nicotine dependence: Secondary | ICD-10-CM | POA: Diagnosis not present

## 2018-09-08 DIAGNOSIS — Z0001 Encounter for general adult medical examination with abnormal findings: Secondary | ICD-10-CM

## 2018-09-08 DIAGNOSIS — Z125 Encounter for screening for malignant neoplasm of prostate: Secondary | ICD-10-CM

## 2018-09-08 DIAGNOSIS — G4733 Obstructive sleep apnea (adult) (pediatric): Secondary | ICD-10-CM

## 2018-09-08 DIAGNOSIS — E0822 Diabetes mellitus due to underlying condition with diabetic chronic kidney disease: Secondary | ICD-10-CM

## 2018-09-08 DIAGNOSIS — Z136 Encounter for screening for cardiovascular disorders: Secondary | ICD-10-CM

## 2018-09-08 DIAGNOSIS — N138 Other obstructive and reflux uropathy: Secondary | ICD-10-CM

## 2018-09-08 DIAGNOSIS — Z0000 Encounter for general adult medical examination without abnormal findings: Secondary | ICD-10-CM

## 2018-09-08 DIAGNOSIS — Z794 Long term (current) use of insulin: Secondary | ICD-10-CM

## 2018-09-08 DIAGNOSIS — I10 Essential (primary) hypertension: Secondary | ICD-10-CM

## 2018-09-08 DIAGNOSIS — Z9989 Dependence on other enabling machines and devices: Secondary | ICD-10-CM

## 2018-09-08 DIAGNOSIS — Z8249 Family history of ischemic heart disease and other diseases of the circulatory system: Secondary | ICD-10-CM | POA: Diagnosis not present

## 2018-09-08 DIAGNOSIS — Z1211 Encounter for screening for malignant neoplasm of colon: Secondary | ICD-10-CM

## 2018-09-08 DIAGNOSIS — I480 Paroxysmal atrial fibrillation: Secondary | ICD-10-CM

## 2018-09-08 DIAGNOSIS — I43 Cardiomyopathy in diseases classified elsewhere: Secondary | ICD-10-CM

## 2018-09-08 DIAGNOSIS — E1122 Type 2 diabetes mellitus with diabetic chronic kidney disease: Secondary | ICD-10-CM | POA: Diagnosis not present

## 2018-09-08 DIAGNOSIS — N401 Enlarged prostate with lower urinary tract symptoms: Secondary | ICD-10-CM

## 2018-09-08 DIAGNOSIS — I110 Hypertensive heart disease with heart failure: Secondary | ICD-10-CM

## 2018-09-08 DIAGNOSIS — E1169 Type 2 diabetes mellitus with other specified complication: Secondary | ICD-10-CM

## 2018-09-08 DIAGNOSIS — Z7901 Long term (current) use of anticoagulants: Secondary | ICD-10-CM

## 2018-09-08 DIAGNOSIS — Z5181 Encounter for therapeutic drug level monitoring: Secondary | ICD-10-CM

## 2018-09-08 DIAGNOSIS — I272 Pulmonary hypertension, unspecified: Secondary | ICD-10-CM

## 2018-09-08 DIAGNOSIS — M109 Gout, unspecified: Secondary | ICD-10-CM

## 2018-09-08 DIAGNOSIS — E559 Vitamin D deficiency, unspecified: Secondary | ICD-10-CM

## 2018-09-08 DIAGNOSIS — N184 Chronic kidney disease, stage 4 (severe): Secondary | ICD-10-CM | POA: Diagnosis not present

## 2018-09-08 DIAGNOSIS — E782 Mixed hyperlipidemia: Secondary | ICD-10-CM

## 2018-09-08 DIAGNOSIS — Z951 Presence of aortocoronary bypass graft: Secondary | ICD-10-CM

## 2018-09-08 DIAGNOSIS — N183 Chronic kidney disease, stage 3 unspecified: Secondary | ICD-10-CM

## 2018-09-09 LAB — PROTIME-INR
INR: 2.2 — ABNORMAL HIGH
Prothrombin Time: 22 s — ABNORMAL HIGH (ref 9.0–11.5)

## 2018-09-09 LAB — URIC ACID: Uric Acid, Serum: 5.3 mg/dL (ref 4.0–8.0)

## 2018-09-09 LAB — HEMOGLOBIN A1C
Hgb A1c MFr Bld: 6.8 % of total Hgb — ABNORMAL HIGH (ref <5.7)
Mean Plasma Glucose: 148 (calc)
eAG (mmol/L): 8.2 (calc)

## 2018-09-09 LAB — CBC WITH DIFFERENTIAL/PLATELET
Absolute Monocytes: 499 cells/uL (ref 200–950)
Basophils Absolute: 47 cells/uL (ref 0–200)
Basophils Relative: 0.6 %
Eosinophils Absolute: 109 cells/uL (ref 15–500)
Eosinophils Relative: 1.4 %
HCT: 38.7 % (ref 38.5–50.0)
Hemoglobin: 12.7 g/dL — ABNORMAL LOW (ref 13.2–17.1)
Lymphs Abs: 710 cells/uL — ABNORMAL LOW (ref 850–3900)
MCH: 32.2 pg (ref 27.0–33.0)
MCHC: 32.8 g/dL (ref 32.0–36.0)
MCV: 98 fL (ref 80.0–100.0)
MPV: 8.8 fL (ref 7.5–12.5)
Monocytes Relative: 6.4 %
Neutro Abs: 6435 cells/uL (ref 1500–7800)
Neutrophils Relative %: 82.5 %
Platelets: 167 10*3/uL (ref 140–400)
RBC: 3.95 10*6/uL — ABNORMAL LOW (ref 4.20–5.80)
RDW: 13.2 % (ref 11.0–15.0)
Total Lymphocyte: 9.1 %
WBC: 7.8 10*3/uL (ref 3.8–10.8)

## 2018-09-09 LAB — COMPLETE METABOLIC PANEL WITH GFR
AG Ratio: 1.7 (calc) (ref 1.0–2.5)
ALT: 24 U/L (ref 9–46)
AST: 23 U/L (ref 10–35)
Albumin: 4.2 g/dL (ref 3.6–5.1)
Alkaline phosphatase (APISO): 97 U/L (ref 35–144)
BUN/Creatinine Ratio: 17 (calc) (ref 6–22)
BUN: 28 mg/dL — ABNORMAL HIGH (ref 7–25)
CO2: 31 mmol/L (ref 20–32)
Calcium: 9.1 mg/dL (ref 8.6–10.3)
Chloride: 103 mmol/L (ref 98–110)
Creat: 1.64 mg/dL — ABNORMAL HIGH (ref 0.70–1.18)
GFR, Est African American: 45 mL/min/{1.73_m2} — ABNORMAL LOW (ref >=60)
GFR, Est Non African American: 39 mL/min/{1.73_m2} — ABNORMAL LOW (ref >=60)
Globulin: 2.5 g/dL (calc) (ref 1.9–3.7)
Glucose, Bld: 156 mg/dL — ABNORMAL HIGH (ref 65–99)
Potassium: 4.1 mmol/L (ref 3.5–5.3)
Sodium: 141 mmol/L (ref 135–146)
Total Bilirubin: 1 mg/dL (ref 0.2–1.2)
Total Protein: 6.7 g/dL (ref 6.1–8.1)

## 2018-09-09 LAB — MICROALBUMIN / CREATININE URINE RATIO
Creatinine, Urine: 78 mg/dL (ref 20–320)
Microalb Creat Ratio: 126 mcg/mg creat — ABNORMAL HIGH (ref <30)
Microalb, Ur: 9.8 mg/dL

## 2018-09-09 LAB — LIPID PANEL
Cholesterol: 141 mg/dL (ref <200)
HDL: 30 mg/dL — ABNORMAL LOW (ref >=40)
LDL Cholesterol (Calc): 84 mg/dL (calc)
Non-HDL Cholesterol (Calc): 111 mg/dL (calc) (ref <130)
Total CHOL/HDL Ratio: 4.7 (calc) (ref <5.0)
Triglycerides: 177 mg/dL — ABNORMAL HIGH (ref <150)

## 2018-09-09 LAB — URINALYSIS, ROUTINE W REFLEX MICROSCOPIC
Bacteria, UA: NONE SEEN /HPF
Bilirubin Urine: NEGATIVE
Glucose, UA: NEGATIVE
Hgb urine dipstick: NEGATIVE
Hyaline Cast: NONE SEEN /LPF
Ketones, ur: NEGATIVE
Leukocytes,Ua: NEGATIVE
Nitrite: NEGATIVE
RBC / HPF: NONE SEEN /HPF (ref 0–2)
Specific Gravity, Urine: 1.013 (ref 1.001–1.03)
Squamous Epithelial / LPF: NONE SEEN /HPF (ref <=5)
WBC, UA: NONE SEEN /HPF (ref 0–5)
pH: 6 (ref 5.0–8.0)

## 2018-09-09 LAB — VITAMIN D 25 HYDROXY (VIT D DEFICIENCY, FRACTURES): Vit D, 25-Hydroxy: 61 ng/mL (ref 30–100)

## 2018-09-09 LAB — MAGNESIUM: Magnesium: 2.4 mg/dL (ref 1.5–2.5)

## 2018-09-09 LAB — PTH, INTACT AND CALCIUM
Calcium: 9.1 mg/dL (ref 8.6–10.3)
PTH: 72 pg/mL — ABNORMAL HIGH (ref 14–64)

## 2018-09-09 LAB — PSA: PSA: 0.8 ng/mL (ref <=4.0)

## 2018-09-09 LAB — INSULIN, RANDOM: Insulin: 32.3 u[IU]/mL — ABNORMAL HIGH

## 2018-09-09 LAB — TSH: TSH: 1.7 mIU/L (ref 0.40–4.50)

## 2018-09-11 ENCOUNTER — Encounter: Payer: Self-pay | Admitting: Internal Medicine

## 2018-09-19 ENCOUNTER — Ambulatory Visit: Payer: Self-pay | Admitting: Adult Health

## 2018-09-25 ENCOUNTER — Other Ambulatory Visit: Payer: Self-pay | Admitting: Internal Medicine

## 2018-09-30 LAB — HM DIABETES EYE EXAM

## 2018-10-06 NOTE — Progress Notes (Signed)
Assessment and Plan:  PAF (paroxysmal atrial fibrillation) (HCC) Check INR and will adjust medication according to labs.  Discussed if patient falls to immediately contact office or go to ER. Discussed foods that can increase or decrease Coumadin levels. Patient understands to call the office before starting a new medication. Follow up in one month.   Chronic diastolic congestive heart failure (HCC) Weights up, denies fluid overload symptoms, consistent with baseline, take PRN metolazone x 1 dose and evaluate weight progress if 190 lb+, call if not improving or follow up with Dr. Missy Sabins goal to get 183-184 lb range per cardiology Emphasized salt restriction, less than 2071m a day. Encouraged daily monitoring of the patient's weight, call office if 5 lb weight loss or gain in a day.  Encouraged regular exercise. If any increasing shortness of breath, swelling, or chest pressure go to ER immediately.  decrease your fluid intake to less than 1.5 L daily please remember to always increase your potassium intake with any increase of your fluid pill.   Hypertension At goal; continue medications Monitor blood pressure at home; call if consistently over 130/80 Continue DASH diet.   Reminder to go to the ER if any CP, SOB, nausea, dizziness, severe HA, changes vision/speech, left arm numbness and tingling and jaw pain.  CKD (chronic kidney disease) stage 3, GFR 30-59 ml/min (HCC) Increase fluids, avoid NSAIDS, monitor sugars, will monitor BMP/GFR  Diabetes/ labile glucose  Labile but improved, no hypoglycemic episodes in last 2 weeks Fasting glucose generally well controlled (100-130) Reviewed hypoglycemia management principles Low sugar more dangerous than high sugar No insulin dose changes recommended today; continue 47 in AM, 30-35 in PM and close monitoring Call with any concerns  Need for influenza vaccine - quadrivalent administered today   Further disposition pending results of  labs. Discussed med's effects and SE's.   Over 30 minutes of exam, counseling, chart review, and critical decision making was performed.   Future Appointments  Date Time Provider [place] 11/15/2018 11:15 AM MGarnet Sierras NP [facility] None  12/16/2018 12:45 PM MHayden Pedro MD [facility] None  12/19/2018  3:30 PM MUnk Pinto MD [facility] None  10/11/2019 10:00 AM MUnk Pinto MD [facility] None    ------------------------------------------------------------------------------------------------------------------   HPI BP 110/70   Pulse 63   Temp (!) 97.3 F (36.3 C)   Ht 5' 11"  (1.803 m)   Wt 196 lb 12.8 oz (89.3 kg)   SpO2 99%   BMI 27.45 kg/m   78 y.o.male presents for follow up on coumadin for p. A. fib, CHF, htn, CKD III/IV.  His blood pressure has been controlled at home, today their BP is BP: 110/70  He does not workout. He denies chest pain, shortness of breath, dizziness.  He has a history of Diastolic CHF, denies orthopnea, paroxysmal nocturnal dyspnea and edema. Positive for scant edema and mild exertional dyspnea he reports consistent with baseline. Per Dr. BMissy Sabinsgoal weight is 182-183lb. He is taking torsemide 100 mg in AM and 50 mg PM, and does metolazone 5 mg PRN but reports hasn't needed recently. He reports home weight today was 189.8 lb Wt Readings from Last 3 Encounters:  10/10/18 196 lb 12.8 oz (89.3 kg)  09/08/18 194 lb 9.6 oz (88.3 kg)  09/05/18 191 lb (86.6 kg)   Patient is on Coumadin for PAF (paroxysmal atrial fibrillation) (HCC) [I48.0] Patient's last INR is  Lab Results  Component Value Date   INR 2.2 (H) 09/08/2018   INR 2.4 (  H) 07/27/2018   INR 3.0 (H) 06/27/2018    Patient denies SOB, CP, dizziness, and blood in stool/urine.  His coumadin dose was not changed last visit. He has not taken ABX, has not missed any doses and denies a fall.   Current dose: 2 tabs daily or 10 mg daily.    He is only on the 70/30  insulin for his DM due to his kidney function.  He was reporting labile blood glucose; was having as low as 50 fasting and 40s in evenings at last visit, has decreased to 47 units in AM and 30-35 units in the PM and no low glucose values in the last 2 weeks per log today.  Lab Results  Component Value Date   HGBA1C 6.8 (H) 09/08/2018   Lab Results  Component Value Date   GFRNONAA 38 (L) 09/08/2018     Past Medical History:  Diagnosis Date  . Adrenal adenoma   . Anemia   . Anxiety   . Arthritis   . Atypical atrial flutter ([place]) 8/15, 10/15   a. DCCV 08/2013. b. s/p RFA 10/2013.  [name] Basal cell carcinoma   . CAD (coronary artery disease)    a. 04/2013 CABG x 2: LIMA to LAD, SVG to RI, EVH via R thigh.  . Cellulitis 12/2015   left leg  . Chronic diastolic congestive heart failure ([place])   . CKD (chronic kidney disease), stage III   . Depression   . Diabetes mellitus type II   . Diverticulosis 2001  . DJD (degenerative joint disease)   . GERD (gastroesophageal reflux disease)   . Gout   . H/O hiatal hernia   . History of cardioversion    x3 (years uncertain)  . Hx of adenomatous colonic polyps   . Hyperlipidemia   . Hypertension   . Hypertensive cardiomyopathy ([place])   . Obstructive sleep apnea    compliant with CPAP  . 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 Persistent atrial fibrillation ([place])    a. s/p MAZE 04/2013 in setting of CABG. b. Amio stopped in 10/2013 after flutter ablation.  [name] PFO (patent foramen ovale)    a. Small PFO by TEE 10/2013.  [name] Pleural effusion, left    a. s/p thoracentesis 05/2013.  [name] Respiratory failure ([place])    a. Hypoxia 10/2013 - required supp O2 as inpatient, did not require it at discharge.  . S/P Maze operation for atrial fibrillation    a. 04/2013: Complete bilateral atrial lesion set using cryothermy and bipolar radiofrequency ablation  with clipping of LA appendage (@ time of CABG)  . S/P Maze operation for atrial fibrillation 04/05/2013   Complete bilateral atrial lesion set using cryothermy and bipolar radiofrequency ablation with clipping of LA appendage via median sternotomy approach      Allergies  Allergen Reactions  . Sunflower Seed [Sunflower Oil] Swelling and Other (See Comments)    Tongue and lip swelling  . Horse-Derived Products Other (See Comments)    Per allergy skin test UNSPECIFIED REACTION   . Tetanus Toxoids Other (See Comments)    Per allergy skin test  . Tetanus Toxoid     Other reaction(s): Other (See Comments) Rash(horse serum)    Current Outpatient Medications on File Prior to Visit  Medication Sig  . ACCU-CHEK AVIVA PLUS test strip CHECK BLOOD GLUCOSE 3 TIMES DAILY.  [name] acetaminophen (TYLENOL) 500 MG tablet  Take 1,000 mg by mouth every 6 (six) hours as needed for moderate pain or headache.   . allopurinol (ZYLOPRIM) 300 MG tablet Take 1 tablet daily to prevent gout (Patient taking differently: Take 300 mg by mouth daily. )  . ALPRAZolam (XANAX) 1 MG tablet Take 0.5 tablets (0.5 mg total) by mouth at bedtime as needed for sleep.  [name] Alum Hydroxide-Mag Carbonate (GAVISCON PO) Take 4 tablets by mouth daily as needed (acid reflux).   [name] aspirin EC 81 MG tablet Take 81 mg by mouth daily.  [name] atorvastatin (LIPITOR) 40 MG tablet Take 0.5 tablets (20 mg total) by mouth daily at 6 PM.  . azelastine (ASTELIN) 0.1 % nasal spray PLACE 2 SPRAYS INTO BOTH NOSTRILS 2 (TWO) TIMES DAILY. USE IN EACH NOSTRIL AS DIRECTED (Patient taking differently: Place 2 sprays into both nostrils as needed. Use in each nostril as directed)  . B Complex Vitamins (VITAMIN B COMPLEX PO) Take 1 tablet by mouth at bedtime.   . bacitracin 500 UNIT/GM ointment Apply 1 application topically daily as needed for wound care.  . benzonatate (TESSALON) 200 MG capsule Take 1 capsule (200 mg total) by mouth 3 (three) times daily as needed for  cough (Max: 663m per day).  . Blood Glucose Monitoring Suppl (ACCU-CHEK AVIVA PLUS) w/Device KIT Check blood sugar 1 time  daily  . Blood Glucose Monitoring Suppl DEVI Test blood sugar up to three times a day or as directed.  . carvedilol (COREG) 6.25 MG tablet Take 1 tablet 3 x /day for BP & Heart  . Cholecalciferol (VITAMIN D PO) Take 5,000 Units by mouth daily.   . colchicine 0.6 MG tablet Take 0.6 mg by mouth daily as needed (for gout flare up).   . diphenhydrAMINE (BENADRYL) 25 MG tablet Take 25 mg by mouth at bedtime as needed for allergies.   . fluticasone (FLONASE) 50 MCG/ACT nasal spray Place 1 spray into both nostrils daily as needed for allergies.  .Marland Kitcheninsulin NPH-regular Human (70-30) 100 UNIT/ML injection 45 units AM, 30-35 units PM (Patient taking differently: 47 units AM, 30-35 units PM)  . lidocaine (LIDODERM) 5 % Place 1 patch onto the skin daily as needed (for pain). Remove & Discard patch within 12 hours or as directed by MD   . loratadine (CLARITIN) 10 MG tablet Take 10 mg by mouth daily as needed for allergies.   . metolazone (ZAROXOLYN) 5 MG tablet TAKE 1 TABLET BY MOUTH  DAILY AS NEEDED (WEIGHT  GAIN AND EDEMA)  . Multiple Vitamins-Minerals (PRESERVISION AREDS 2) CAPS Take 1 capsule by mouth 2 (two) times daily.   . polyvinyl alcohol (ARTIFICIAL TEARS) 1.4 % ophthalmic solution Place 1 drop into both eyes daily as needed for dry eyes.  . potassium chloride SA (K-DUR) 20 MEQ tablet Take two tablets, three times a day.  . sertraline (ZOLOFT) 100 MG tablet Take 1 tablet Daily for Mood  . torsemide (DEMADEX) 100 MG tablet TAKE 1 TABLET BY MOUTH IN  THE MORNING AND 1/2 TABLET  IN THE EVENING IF NEEDED  FOR WEIGHT GAIN (Patient taking differently: Takes 1.5 tablets in the morning)  . warfarin (COUMADIN) 5 MG tablet Take 1 to 2 tablets daily as directed  . ferrous sulfate 325 (65 FE) MG tablet Take 325 mg by mouth 2 (two) times daily with a meal.  . PRESCRIPTION MEDICATION Inhale  into the lungs at bedtime. CPAP   No current facility-administered medications on file prior to visit.  ROS: Review of Systems  Constitutional: Negative for malaise/fatigue and weight loss.  HENT: Negative for congestion, hearing loss and tinnitus.   Eyes: Negative for blurred vision and double vision.  Respiratory: Negative for cough, sputum production, shortness of breath and wheezing.   Cardiovascular: Negative for chest pain, palpitations, orthopnea, claudication, leg swelling and PND.  Gastrointestinal: Negative for abdominal pain, blood in stool, constipation, diarrhea, heartburn, melena, nausea and vomiting.  Genitourinary: Negative.   Musculoskeletal: Negative for joint pain and myalgias.  Skin: Negative for rash.  Neurological: Negative for dizziness, tingling, sensory change, weakness and headaches.  Endo/Heme/Allergies: Negative for polydipsia. Bruises/bleeds easily.  Psychiatric/Behavioral: Negative.   All other systems reviewed and are negative.    Physical Exam:  BP 110/70   Pulse 63   Temp (!) 97.3 F (36.3 C)   Ht 5' 11"  (1.803 m)   Wt 196 lb 12.8 oz (89.3 kg)   SpO2 99%   BMI 27.45 kg/m   General Appearance: Well nourished, in no apparent distress. Eyes: PERRLA, EOMs, conjunctiva no swelling or erythema Sinuses: No Frontal/maxillary tenderness ENT/Mouth: Ext aud canals clear, TMs without erythema, bulging. No erythema, swelling, or exudate on post pharynx.  Tonsils not swollen or erythematous. Hearing normal.  Neck: Supple, thyroid normal.  Respiratory: Respiratory effort normal, BS equal bilaterally without rales, rhonchi, wheezing or stridor.  Cardio: RRR with no MRGs. Brisk peripheral pulses with scant non-pitting edema   Abdomen: Soft, obese/mildly distended, + BS.  Non tender, no guarding, rebound, hernias, masses. Lymphatics: Non tender without lymphadenopathy.  Musculoskeletal: Full ROM, 5/5 strength, normal gait.  Skin: Warm, dry without rashes,  lesions; he has fragile skin and numerous small ecchymoses to bilateral upper extremities Neuro: Cranial nerves intact. Normal muscle tone, no cerebellar symptoms. Sensation intact.  Psych: Awake and oriented X 3, normal affect, Insight and Judgment appropriate.     Izora Ribas, NP 11:10 AM [facility]

## 2018-10-10 ENCOUNTER — Encounter: Payer: Self-pay | Admitting: Adult Health

## 2018-10-10 ENCOUNTER — Other Ambulatory Visit: Payer: Self-pay

## 2018-10-10 ENCOUNTER — Ambulatory Visit (INDEPENDENT_AMBULATORY_CARE_PROVIDER_SITE_OTHER): Payer: Medicare Other | Admitting: Adult Health

## 2018-10-10 VITALS — BP 110/70 | HR 63 | Temp 97.3°F | Ht 71.0 in | Wt 196.8 lb

## 2018-10-10 DIAGNOSIS — N183 Chronic kidney disease, stage 3 unspecified: Secondary | ICD-10-CM | POA: Diagnosis not present

## 2018-10-10 DIAGNOSIS — E1122 Type 2 diabetes mellitus with diabetic chronic kidney disease: Secondary | ICD-10-CM

## 2018-10-10 DIAGNOSIS — I5032 Chronic diastolic (congestive) heart failure: Secondary | ICD-10-CM | POA: Diagnosis not present

## 2018-10-10 DIAGNOSIS — R7309 Other abnormal glucose: Secondary | ICD-10-CM

## 2018-10-10 DIAGNOSIS — N184 Chronic kidney disease, stage 4 (severe): Secondary | ICD-10-CM

## 2018-10-10 DIAGNOSIS — I480 Paroxysmal atrial fibrillation: Secondary | ICD-10-CM | POA: Diagnosis not present

## 2018-10-10 DIAGNOSIS — Z5181 Encounter for therapeutic drug level monitoring: Secondary | ICD-10-CM

## 2018-10-10 DIAGNOSIS — Z7901 Long term (current) use of anticoagulants: Secondary | ICD-10-CM

## 2018-10-10 DIAGNOSIS — I10 Essential (primary) hypertension: Secondary | ICD-10-CM | POA: Diagnosis not present

## 2018-10-10 DIAGNOSIS — Z794 Long term (current) use of insulin: Secondary | ICD-10-CM

## 2018-10-10 DIAGNOSIS — Z23 Encounter for immunization: Secondary | ICD-10-CM

## 2018-10-10 DIAGNOSIS — D509 Iron deficiency anemia, unspecified: Secondary | ICD-10-CM | POA: Diagnosis not present

## 2018-10-10 NOTE — Addendum Note (Signed)
Addended by: Chancy Hurter on: 10/10/2018 12:09 PM   Modules accepted: Orders

## 2018-10-11 LAB — BASIC METABOLIC PANEL WITH GFR
BUN/Creatinine Ratio: 15 (calc) (ref 6–22)
BUN: 24 mg/dL (ref 7–25)
CO2: 31 mmol/L (ref 20–32)
Calcium: 9 mg/dL (ref 8.6–10.3)
Chloride: 105 mmol/L (ref 98–110)
Creat: 1.58 mg/dL — ABNORMAL HIGH (ref 0.70–1.18)
GFR, Est African American: 48 mL/min/{1.73_m2} — ABNORMAL LOW (ref >=60)
GFR, Est Non African American: 41 mL/min/{1.73_m2} — ABNORMAL LOW (ref >=60)
Glucose, Bld: 67 mg/dL (ref 65–99)
Potassium: 4 mmol/L (ref 3.5–5.3)
Sodium: 143 mmol/L (ref 135–146)

## 2018-10-11 LAB — CBC WITH DIFFERENTIAL/PLATELET
Absolute Monocytes: 537 cells/uL (ref 200–950)
Basophils Absolute: 47 cells/uL (ref 0–200)
Basophils Relative: 0.6 %
Eosinophils Absolute: 119 cells/uL (ref 15–500)
Eosinophils Relative: 1.5 %
HCT: 39.2 % (ref 38.5–50.0)
Hemoglobin: 13.1 g/dL — ABNORMAL LOW (ref 13.2–17.1)
Lymphs Abs: 687 cells/uL — ABNORMAL LOW (ref 850–3900)
MCH: 32.5 pg (ref 27.0–33.0)
MCHC: 33.4 g/dL (ref 32.0–36.0)
MCV: 97.3 fL (ref 80.0–100.0)
MPV: 9.1 fL (ref 7.5–12.5)
Monocytes Relative: 6.8 %
Neutro Abs: 6510 cells/uL (ref 1500–7800)
Neutrophils Relative %: 82.4 %
Platelets: 180 10*3/uL (ref 140–400)
RBC: 4.03 10*6/uL — ABNORMAL LOW (ref 4.20–5.80)
RDW: 13.3 % (ref 11.0–15.0)
Total Lymphocyte: 8.7 %
WBC: 7.9 10*3/uL (ref 3.8–10.8)

## 2018-10-11 LAB — PROTIME-INR
INR: 2 — ABNORMAL HIGH
Prothrombin Time: 20.3 s — ABNORMAL HIGH (ref 9.0–11.5)

## 2018-10-18 ENCOUNTER — Ambulatory Visit (INDEPENDENT_AMBULATORY_CARE_PROVIDER_SITE_OTHER): Payer: Medicare Other | Admitting: Physician Assistant

## 2018-10-18 ENCOUNTER — Encounter: Payer: Self-pay | Admitting: Physician Assistant

## 2018-10-18 ENCOUNTER — Other Ambulatory Visit: Payer: Self-pay

## 2018-10-18 VITALS — BP 120/68 | HR 65 | Temp 97.5°F | Ht 71.0 in | Wt 197.0 lb

## 2018-10-18 DIAGNOSIS — Z7901 Long term (current) use of anticoagulants: Secondary | ICD-10-CM | POA: Diagnosis not present

## 2018-10-18 DIAGNOSIS — H00014 Hordeolum externum left upper eyelid: Secondary | ICD-10-CM

## 2018-10-18 MED ORDER — DOXYCYCLINE HYCLATE 100 MG PO CAPS: ORAL_CAPSULE | ORAL | 0 refills | Status: AC

## 2018-10-18 NOTE — Patient Instructions (Signed)
Eye doctors that you can call, they are all very close to our office  Dr. Delman Cheadle [phone] Dr. Bing Plume [phone] Dr. Herbert Deaner [phone]  While on ABX will cut back on his coumadin to alternate 10 and 5 mg and then go back to 10 mg daily afterwards. compresses for 5 to 10 minutes three to five times per day in order to facilitate drainage.  Massage and gentle wiping of the affected eyelid after the warm compress can also aid in drainage   Stye  A stye, also known as a hordeolum, is a bump that forms on an eyelid. It may look like a pimple next to the eyelash. A stye can form inside the eyelid (internal stye) or outside the eyelid (external stye). A stye can cause redness, swelling, and pain on the eyelid. Styes are very common. Anyone can get them at any age. They usually occur in just one eye, but you may have more than one in either eye. What are the causes? A stye is caused by an infection. The infection is almost always caused by bacteria called Staphylococcus aureus. This is a common type of bacteria that lives on the skin. An internal stye may result from an infected oil-producing gland inside the eyelid. An external stye may be caused by an infection at the base of the eyelash (hair follicle). What increases the risk? You are more likely to develop a stye if:  You have had a stye before.  You have any of these conditions: ? Diabetes. ? Red, itchy, inflamed eyelids (blepharitis). ? A skin condition such as seborrheic dermatitis or rosacea. ? High fat levels in your blood (lipids). What are the signs or symptoms? The most common symptom of a stye is eyelid pain. Internal styes are more painful than external styes. Other symptoms may include:  Painful swelling of your eyelid.  A scratchy feeling in your eye.  Tearing and redness of your eye.  Pus draining from the stye. How is this diagnosed? Your health care provider may be able to diagnose a stye just by examining your  eye. The health care provider may also check to make sure:  You do not have a fever or other signs of a more serious infection.  The infection has not spread to other parts of your eye or areas around your eye. How is this treated? Most styes will clear up in a few days without treatment or with warm compresses applied to the area. You may need to use antibiotic drops or ointment to treat an infection. In some cases, if your stye does not heal with routine treatment, your health care provider may drain pus from the stye using a thin blade or needle. This may be done if the stye is large, causing a lot of pain, or affecting your vision. Follow these instructions at home:  Take over-the-counter and prescription medicines only as told by your health care provider. This includes eye drops or ointments.  If you were prescribed an antibiotic medicine, apply or use it as told by your health care provider. Do not stop using the antibiotic even if your condition improves.  Apply a warm, wet cloth (warm compress) to your eye for 5-10 minutes, 4 times a day.  Clean the affected eyelid as directed by your health care provider.  Do not wear contact lenses or eye makeup until your stye has healed.  Do not try to pop or drain the stye.  Do  not rub your eye. Contact a health care provider if:  You have chills or a fever.  Your stye does not go away after several days.  Your stye affects your vision.  Your eyeball becomes swollen, red, or painful. Get help right away if:  You have pain when moving your eye around. Summary  A stye is a bump that forms on an eyelid. It may look like a pimple next to the eyelash.  A stye can form inside the eyelid (internal stye) or outside the eyelid (external stye). A stye can cause redness, swelling, and pain on the eyelid.  Your health care provider may be able to diagnose a stye just by examining your eye.  Apply a warm, wet cloth (warm compress) to your  eye for 5-10 minutes, 4 times a day. This information is not intended to replace advice given to you by your health care provider. Make sure you discuss any questions you have with your health care provider. Document Released: 10/01/2004 Document Revised: 12/04/2016 Document Reviewed: 09/03/2016 Elsevier Patient Education  2020 [employer].

## 2018-10-18 NOTE — Progress Notes (Signed)
Subjective:    Patient ID: [name]., male    DOB: 1940-06-04, 78 y.o.   MRN: [id number]  HPI 78 y.o. WM with history of has Hyperlipidemia, mixed; PAF (paroxysmal atrial fibrillation) ([place]); Diverticulosis of large intestine; Colonic Polyps; Chronic diastolic congestive heart failure ([place]); Hypertensive cardiomyopathy ([place]); 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; Type 2 diabetes mellitus with stage 4 chronic kidney disease, with long-term current use of insulin ([place]); GERD; DJD (degenerative joint disease); Pleural effusion on left; Vitamin D deficiency; Encounter for monitoring Coumadin therapy; Chronic anticoagulation; Essential hypertension; CAD (coronary artery disease); PFO (patent foramen ovale); 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[place]); OSA on CPAP; S/P Maze operation for atrial fibrillation; Pulmonary hypertension ([place]); 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 Started out with tenderness on upper eye lid but has gotten worse. He had some yellow eye discharge Sunday morning but that resolved. He has been using polysporin drops and bacitracin.  He has done warm compresses but not on regular basis.  Obscuring his vision slightly. No eye pain.  No fever, chills. No stiff neck. Last saw eye doctor 6 weeks ago.   He is on 10 mg of his coumadin a day.  Lab Results  Component Value Date   INR 2.0 (H) 10/10/2018   INR 2.2 (H) 09/08/2018   INR 2.4 (H) 07/27/2018    Blood pressure 120/68, pulse 65, temperature (!) 97.5 F (36.4 C), height 5' 11" (1.803 m), weight 197 lb (89.4 kg), SpO2 95 %.  Medications Current Outpatient Medications on File Prior to Visit   Medication Sig  . ACCU-CHEK AVIVA PLUS test strip CHECK BLOOD GLUCOSE 3 TIMES DAILY.  [name] acetaminophen (TYLENOL) 500 MG tablet Take 1,000 mg by mouth every 6 (six) hours as needed for moderate pain or headache.   . ALPRAZolam (XANAX) 1 MG tablet Take 0.5 tablets (0.5 mg total) by mouth at bedtime as needed for sleep.  [name] Alum Hydroxide-Mag Carbonate (GAVISCON PO) Take 4 tablets by mouth daily as needed (acid reflux).   [name] aspirin EC 81 MG tablet Take 81 mg by mouth daily.  [name] atorvastatin (LIPITOR) 40 MG tablet Take 0.5 tablets (20 mg total) by mouth daily at 6 PM.  . B Complex Vitamins (VITAMIN B COMPLEX PO) Take 1 tablet by mouth at bedtime.   . bacitracin 500 UNIT/GM ointment Apply 1 application topically daily as needed for wound care.  . benzonatate (TESSALON) 200 MG capsule Take 1 capsule (200 mg total) by mouth 3 (three) times daily as needed for cough (Max: 649m per day).  . Blood Glucose Monitoring Suppl (ACCU-CHEK AVIVA PLUS) w/Device KIT Check blood sugar 1 time  daily  . Blood Glucose Monitoring Suppl DEVI Test blood sugar up to three times a day or as directed.  . carvedilol (COREG) 6.25 MG tablet Take 1 tablet 3 x /day for BP & Heart  . Cholecalciferol (VITAMIN D PO) Take 5,000 Units by mouth daily.   . colchicine 0.6 MG tablet Take 0.6 mg by mouth daily as needed (for gout flare up).   . diphenhydrAMINE (BENADRYL) 25 MG tablet Take 25 mg by mouth at bedtime as needed for allergies.   . ferrous sulfate 325 (65  FE) MG tablet Take 325 mg by mouth 2 (two) times daily with a meal.  . fluticasone (FLONASE) 50 MCG/ACT nasal spray Place 1 spray into both nostrils daily as needed for allergies.  [name] insulin NPH-regular Human (70-30) 100 UNIT/ML injection 45 units AM, 30-35 units PM (Patient taking differently: 47 units AM, 30-35 units PM)  . lidocaine (LIDODERM) 5 % Place 1 patch onto the skin daily as needed (for pain). Remove & Discard patch within 12 hours or as directed by MD   .  loratadine (CLARITIN) 10 MG tablet Take 10 mg by mouth daily as needed for allergies.   . metolazone (ZAROXOLYN) 5 MG tablet TAKE 1 TABLET BY MOUTH  DAILY AS NEEDED (WEIGHT  GAIN AND EDEMA)  . Multiple Vitamins-Minerals (PRESERVISION AREDS 2) CAPS Take 1 capsule by mouth 2 (two) times daily.   . polyvinyl alcohol (ARTIFICIAL TEARS) 1.4 % ophthalmic solution Place 1 drop into both eyes daily as needed for dry eyes.  . potassium chloride SA (K-DUR) 20 MEQ tablet Take two tablets, three times a day.  . sertraline (ZOLOFT) 100 MG tablet Take 1 tablet Daily for Mood  . torsemide (DEMADEX) 100 MG tablet TAKE 1 TABLET BY MOUTH IN  THE MORNING AND 1/2 TABLET  IN THE EVENING IF NEEDED  FOR WEIGHT GAIN (Patient taking differently: Takes 1.5 tablets in the morning)  . warfarin (COUMADIN) 5 MG tablet Take 1 to 2 tablets daily as directed  . allopurinol (ZYLOPRIM) 300 MG tablet Take 1 tablet daily to prevent gout (Patient taking differently: Take 300 mg by mouth daily. )  . azelastine (ASTELIN) 0.1 % nasal spray PLACE 2 SPRAYS INTO BOTH NOSTRILS 2 (TWO) TIMES DAILY. USE IN EACH NOSTRIL AS DIRECTED (Patient taking differently: Place 2 sprays into both nostrils as needed. Use in each nostril as directed)   No current facility-administered medications on file prior to visit.     Problem list He has Hyperlipidemia, mixed; PAF (paroxysmal atrial fibrillation) ([place]); Diverticulosis of large intestine; Colonic Polyps; Chronic diastolic congestive heart failure ([place]); Hypertensive cardiomyopathy ([place]); 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; Type 2 diabetes mellitus with stage 4 chronic kidney disease, with long-term current use of insulin ([place]); GERD; DJD (degenerative joint disease); Pleural effusion on left; Vitamin D deficiency; Encounter for monitoring Coumadin therapy; Chronic anticoagulation; Essential hypertension; CAD (coronary artery disease); PFO (patent foramen ovale); Major depression in full remission  ([place]); OSA on CPAP; S/P Maze operation for atrial fibrillation; Pulmonary hypertension ([place]); 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   Review of Systems     Objective:   Physical Exam Constitutional:      Appearance: He is well-developed.  HENT:     Head: Normocephalic and atraumatic.     Comments: Left upper conjunctiva with swollen 3-5m diameter lesion suggestive of stye.   Mild conjunctival erythema surrounding the stye.  Watery drainage, no other erythema, slight surrounding swelling but no induration.  Otherwise eye exam unremarkable, sclera intact.    Right Ear: External ear normal.     Left Ear: External ear normal.     Nose: Nose normal.     Mouth/Throat:     Lips: Pink. No lesions.     Mouth: Mucous membranes are moist.  Eyes:  General: Lids are everted, no foreign bodies appreciated. No scleral icterus.       Right eye: No foreign body, discharge or hordeolum.        Left eye: Hordeolum present.No foreign body or discharge.     Extraocular Movements: Extraocular movements intact.     Conjunctiva/sclera:     Right eye: Right conjunctiva is not injected. No chemosis, exudate or hemorrhage.    Left eye: Left conjunctiva is not injected. No exudate or hemorrhage.    Pupils: Pupils are equal, round, and reactive to light.  Neck:     Musculoskeletal: Normal range of motion and neck supple.  Cardiovascular:     Rate and Rhythm: Normal rate and regular rhythm.  Pulmonary:     Effort: Pulmonary effort is normal.  Lymphadenopathy:     Cervical: No cervical adenopathy.        Assessment & Plan:   Hordeulum with cellulitis Doxycycline 100 mg BId for 7 days While on ABX will cut back on his coumadin to  alternate 10 and 5 mg and then go back to 10 mg daily afterwards. compresses for 5 to 10 minutes three to five times per day in order to facilitate drainage.  Massage and gentle wiping of the affected eyelid after the warm compress can also aid in drainage Follow up with eye doctor if not better  Future Appointments  Date Time Provider [place]  11/15/2018 11:15 AM Garnet Sierras, NP [facility] None  12/16/2018 12:45 PM Hayden Pedro, MD [facility] None  12/19/2018  3:30 PM [name], MD [facility] None  10/11/2019 10:00 AM [name], MD [facility] None

## 2018-11-01 ENCOUNTER — Other Ambulatory Visit: Payer: Self-pay

## 2018-11-01 ENCOUNTER — Encounter: Payer: Self-pay | Admitting: *Deleted

## 2018-11-01 ENCOUNTER — Other Ambulatory Visit: Payer: Medicare Other

## 2018-11-01 DIAGNOSIS — Z7901 Long term (current) use of anticoagulants: Secondary | ICD-10-CM | POA: Diagnosis not present

## 2018-11-02 LAB — PROTIME-INR
INR: 3.5 — ABNORMAL HIGH
Prothrombin Time: 34.1 s — ABNORMAL HIGH (ref 9.0–11.5)

## 2018-11-06 ENCOUNTER — Other Ambulatory Visit: Payer: Self-pay | Admitting: Adult Health

## 2018-11-14 NOTE — Progress Notes (Signed)
Assessment and Plan:  PAF (paroxysmal atrial fibrillation) (HCC)  Check INR and will adjust medication according to labs.  Taking Coumadin 84m daily Discussed if patient falls to immediately contact office or go to ER. Discussed foods that can increase or decrease Coumadin levels. Patient understands to call the office before starting a new medication. No S or S of bleeding Follow up in one month.   Chronic diastolic congestive heart failure (HCC) Weights up, denies fluid overload symptoms, consistent with baseline, take PRN metolazone x 1 dose and evaluate weight progress if 190 lb+, call if not improving or follow up with Dr. BMissy Sabinsgoal to get 183-184 lb range per cardiology Emphasized salt restriction, less than 20041ma day. Encouraged daily monitoring of the patient's weight, call office if 5 lb weight loss or gain in a day.  Encouraged regular exercise. If any increasing shortness of breath, swelling, or chest pressure go to ER immediately.  decrease your fluid intake to less than 1.5 L daily please remember to always increase your potassium intake with any increase of your fluid pill.   Hypertension At goal; continue medications Monitor blood pressure at home; call if consistently over 130/80 Continue DASH diet.   Reminder to go to the ER if any CP, SOB, nausea, dizziness, severe HA, changes vision/speech, left arm numbness and tingling and jaw pain. -     CBC with Diff -     COMPLETE METABOLIC PANEL WITH GFR -     Magnesium  CKD (chronic kidney disease) stage 3, GFR 30-59 ml/min (HCC) Increase fluids  Avoid NSAIDS Blood pressure control Monitor sugars  Will continue to monitor BMP/GFR  Type 2 diabetes mellitus with stage 4 chronic kidney disease, with long-term current use of insulin (HCC) Improved, no hypoglycemic episodes in last 2 weeks Fasting glucose generally well controlled (100-130) Reviewed hypoglycemia management principles Low sugar more dangerous than  high sugar No insulin dose changes recommended today; continue 47 in AM, 30 in PM and close monitoring Call with any concerns -A1C    Sent in [facility] cards one week ago, pending results.  Hyperlipidemia, mixed Continue medications: Atorvastatin 4069miscussed dietary and exercise modifications Low fat diet -     Lipid Profile  Gastroesophageal reflux disease with esophagitis without hemorrhage Doing well at this time, no medications Diet discussed Monitor for triggers Avoid food with high acid content Avoid excessive cafeine Increase water intake    OSA on CPAP Continue CPAP/BiPAP, using nightly for at least 8 hours  Helping with daytime fatigue Weight loss still advised Discussed mask & tubing hygeine  Recurrent major depressive disorder, in full remission (HCCMargateoing well at this time Continue Zoloft 100m52mily with benefit Will continue to monitor  Chronic gout involving toe of left foot without tophus, unspecified cause Doing well at this time Continue Allopurinal 300mg81mly No recent flares Avoid food high in purines and or triggers Will continue to monitor  Senile purpura (HCC) Related to anticoagulation Discussed process, protect skin, sunscreen Continue to monitor  Iron deficiency anemia, unspecified iron deficiency anemia type Continue medications: Ferrous Sulfate 325mg 66mE) BID Take with Vitamin C for increase absorption Asymptomatic Will continue to monitor  Vitamin D deficiency Continue supplementation -     Vitamin D (25 hydroxy)  Chronic restrictive lung disease Doing well Controlling allergies via nasal sprays & antihistamines No inhalers Continue to monitor     Further disposition pending results of labs. Discussed med's effects and SE's.   Over 30 minutes  of exam, counseling, chart review, and critical decision making was performed.   Future Appointments  Date Time Provider [place]  12/16/2018 12:45 PM Hayden Pedro, MD [facility] None  12/19/2018  3:30 PM [name], MD [facility] None  10/11/2019 10:00 AM [name], MD [facility] None    ------------------------------------------------------------------------------------------------------------------   HPI BP 110/66   Pulse 71   Temp (!) 97.3 F (36.3 C)   Ht 5' 11"  (1.803 m)   Wt 196 lb 3.2 oz (89 kg)   SpO2 95%   BMI 27.36 kg/m   78 y.o.male presents for follow up on coumadin for p. A. fib, CHF, HTN, HLD, CKD III., GERD  His blood pressure has been controlled at home, today their BP is BP: 110/66  He does not workout. He denies chest pain, shortness of breath, dizziness.   93mn or more twice twice a week.  He does walking and yesterday he worked in the yard.  He reports he drinks 24 oz and 4 a day and also juice and decaf iced tea.   He has a history of Diastolic CHF, denies orthopnea, paroxysmal nocturnal dyspnea and edema. Positive for scant edema and mild exertional dyspnea he reports consistent with baseline. Per Dr. BMissy Sabinsgoal weight is 182-183lb. He is taking torsemide 100 mg in AM and 50 mg PM, and does metolazone 5 mg PRN but reports hasn't needed recently. He follows up every 6 months.  He reports home weight today was 189.8 lb Wt Readings from Last 3 Encounters:  11/15/18 196 lb 3.2 oz (89 kg)  10/18/18 197 lb (89.4 kg)  10/10/18 196 lb 12.8 oz (89.3 kg)   Patient is on Coumadin for Cardiomyopathy due to hypertension, with heart failure ([place] [I11.0, I43] Patient's last INR is  Lab Results  Component Value Date   INR 3.5 (H) 11/01/2018   INR 2.0 (H) 10/10/2018   INR 2.2 (H) 09/08/2018    Patient denies SOB, CP, dizziness, and blood in stool/urine.  His coumadin dose was not changed last visit. He has not taken ABX, has not missed any doses and denies a fall.   Current dose: 2 tabs daily or 10 mg daily.    He is only on the 70/30 insulin for his DM due to his kidney function.  He was reporting  labile blood glucose; was having as low as 50 fasting and 40s in evenings at last visit, has decreased to 47 units in AM and 30-35 units in the PM and no low glucose values in the last 2 weeks per log today.  Lab Results  Component Value Date   HGBA1C 6.8 (H) 09/08/2018   Lab Results  Component Value Date   GFRNONAA 41 (L) 10/10/2018     Past Medical History:  Diagnosis Date  . Adrenal adenoma   . Anemia   . Anxiety   . Arthritis   . A[place]ical atrial flutter ([place] 8/15, 10/15   a. DCCV 08/2013. b. s/p RFA 10/2013.  .Marland KitchenBasal cell carcinoma   . CAD (coronary artery disease)    a. 04/2013 CABG x 2: LIMA to LAD, SVG to RI, EVH via R thigh.  . Cellulitis 12/2015   left leg  . Chronic diastolic congestive heart failure ([place]   . CKD (chronic kidney disease), stage III   . Depression   . Diabetes mellitus type II   . Diverticulosis 2001  . DJD (degenerative joint disease)   . GERD (gastroesophageal reflux disease)   .  Gout   . H/O hiatal hernia   . History of cardioversion    x3 (years uncertain)  . Hx of adenomatous colonic polyps   . Hyperlipidemia   . Hypertension   . Hypertensive cardiomyopathy ([place])   . Obstructive sleep apnea    compliant with CPAP  . 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 Persistent atrial fibrillation ([place])    a. s/p MAZE 04/2013 in setting of CABG. b. Amio stopped in 10/2013 after flutter ablation.  [name] PFO (patent foramen ovale)    a. Small PFO by TEE 10/2013.  [name] Pleural effusion, left    a. s/p thoracentesis 05/2013.  [name] Respiratory failure ([place])    a. Hypoxia 10/2013 - required supp O2 as inpatient, did not require it at discharge.  . S/P Maze operation for atrial fibrillation    a. 04/2013: Complete bilateral atrial lesion set using cryothermy and bipolar radiofrequency ablation with clipping of LA appendage (@ time of CABG)  . S/P Maze  operation for atrial fibrillation 04/05/2013   Complete bilateral atrial lesion set using cryothermy and bipolar radiofrequency ablation with clipping of LA appendage via median sternotomy approach      Allergies  Allergen Reactions  . Sunflower Seed [Sunflower Oil] Swelling and Other (See Comments)    Tongue and lip swelling  . Horse-Derived Products Other (See Comments)    Per allergy skin test UNSPECIFIED REACTION   . Tetanus Toxoids Other (See Comments)    Per allergy skin test  . Tetanus Toxoid     Other reaction(s): Other (See Comments) Rash(horse serum)    Current Outpatient Medications on File Prior to Visit  Medication Sig  . ACCU-CHEK AVIVA PLUS test strip CHECK BLOOD GLUCOSE 3 TIMES DAILY.  [name] acetaminophen (TYLENOL) 500 MG tablet Take 1,000 mg by mouth every 6 (six) hours as needed for moderate pain or headache.   . ALPRAZolam (XANAX) 1 MG tablet Take 0.5 tablets (0.5 mg total) by mouth at bedtime as needed for sleep.  [name] Alum Hydroxide-Mag Carbonate (GAVISCON PO) Take 4 tablets by mouth daily as needed (acid reflux).   [name] aspirin EC 81 MG tablet Take 81 mg by mouth daily.  [name] atorvastatin (LIPITOR) 40 MG tablet Take 0.5 tablets (20 mg total) by mouth daily at 6 PM.  . B Complex Vitamins (VITAMIN B COMPLEX PO) Take 1 tablet by mouth at bedtime.   . bacitracin 500 UNIT/GM ointment Apply 1 application topically daily as needed for wound care.  . benzonatate (TESSALON) 200 MG capsule Take 1 capsule (200 mg total) by mouth 3 (three) times daily as needed for cough (Max: 669m per day).  . Blood Glucose Monitoring Suppl (ACCU-CHEK AVIVA PLUS) w/Device KIT Check blood sugar 1 time  daily  . Blood Glucose Monitoring Suppl DEVI Test blood sugar up to three times a day or as directed.  . carvedilol (COREG) 6.25 MG tablet Take 1 tablet 3 x /day for BP & Heart  . Cholecalciferol (VITAMIN D PO) Take 5,000 Units by mouth daily.   . colchicine 0.6 MG tablet Take 0.6 mg by mouth daily as  needed (for gout flare up).   . diphenhydrAMINE (BENADRYL) 25 MG tablet Take 25 mg by mouth at bedtime as needed for allergies.   . ferrous sulfate 325 (65 FE) MG tablet Take 325 mg by mouth 2 (two) times daily with a meal.  . fluticasone (  FLONASE) 50 MCG/ACT nasal spray Place 1 spray into both nostrils daily as needed for allergies.  [name] insulin NPH-regular Human (70-30) 100 UNIT/ML injection 45 units AM, 30-35 units PM (Patient taking differently: 47 units AM, 30-35 units PM)  . lidocaine (LIDODERM) 5 % Place 1 patch onto the skin daily as needed (for pain). Remove & Discard patch within 12 hours or as directed by MD   . loratadine (CLARITIN) 10 MG tablet Take 10 mg by mouth daily as needed for allergies.   . metolazone (ZAROXOLYN) 5 MG tablet TAKE 1 TABLET BY MOUTH  DAILY AS NEEDED (WEIGHT  GAIN AND EDEMA)  . Multiple Vitamins-Minerals (PRESERVISION AREDS 2) CAPS Take 1 capsule by mouth 2 (two) times daily.   . polyvinyl alcohol (ARTIFICIAL TEARS) 1.4 % ophthalmic solution Place 1 drop into both eyes daily as needed for dry eyes.  . potassium chloride SA (KLOR-CON) 20 MEQ tablet Take 2 tablets 3 x /day for potassium  . sertraline (ZOLOFT) 100 MG tablet Take 1 tablet Daily for Mood  . torsemide (DEMADEX) 100 MG tablet TAKE 1 TABLET BY MOUTH IN  THE MORNING AND 1/2 TABLET  IN THE EVENING IF NEEDED  FOR WEIGHT GAIN (Patient taking differently: Takes 1.5 tablets in the morning)  . warfarin (COUMADIN) 5 MG tablet Take 1 to 2 tablets daily as directed  . allopurinol (ZYLOPRIM) 300 MG tablet Take 1 tablet daily to prevent gout (Patient taking differently: Take 300 mg by mouth daily. )  . azelastine (ASTELIN) 0.1 % nasal spray PLACE 2 SPRAYS INTO BOTH NOSTRILS 2 (TWO) TIMES DAILY. USE IN EACH NOSTRIL AS DIRECTED (Patient taking differently: Place 2 sprays into both nostrils as needed. Use in each nostril as directed)   No current facility-administered medications on file prior to visit.     ROS: Review  of Systems  Constitutional: Negative for malaise/fatigue and weight loss.  HENT: Negative for congestion, hearing loss and tinnitus.   Eyes: Negative for blurred vision and double vision.  Respiratory: Negative for cough, sputum production, shortness of breath and wheezing.   Cardiovascular: Negative for chest pain, palpitations, orthopnea, claudication, leg swelling and PND.  Gastrointestinal: Negative for abdominal pain, blood in stool, constipation, diarrhea, heartburn, melena, nausea and vomiting.  Genitourinary: Negative.   Musculoskeletal: Negative for joint pain and myalgias.  Skin: Negative for rash.  Neurological: Negative for dizziness, tingling, sensory change, weakness and headaches.  Endo/Heme/Allergies: Negative for polydipsia. Bruises/bleeds easily.  Psychiatric/Behavioral: Negative.   All other systems reviewed and are negative.    Physical Exam:  BP 110/66   Pulse 71   Temp (!) 97.3 F (36.3 C)   Ht 5' 11"  (1.803 m)   Wt 196 lb 3.2 oz (89 kg)   SpO2 95%   BMI 27.36 kg/m   General Appearance: Well nourished, in no apparent distress. Eyes: PERRLA, EOMs, conjunctiva no swelling or erythema Sinuses: No Frontal/maxillary tenderness ENT/Mouth: Ext aud canals clear, TMs without erythema, bulging. No erythema, swelling, or exudate on post pharynx.  Tonsils not swollen or erythematous. Hearing normal.  Respiratory: Respiratory effort normal, BS equal bilaterally without rales, rhonchi, wheezing or stridor.  Cardio: RRR with no MRGs. Brisk peripheral pulses with scant non-pitting edema   Abdomen: Soft, obese, + BS.  Non tender, no guarding, rebound, hernias, masses. Lymphatics: Non tender without lymphadenopathy.  Musculoskeletal: Full ROM, 5/5 strength, normal gait.  Skin: Warm, dry without rashes, lesions; he has fragile skin and numerous small ecchymoses to bilateral upper extremities Neuro:  Cranial nerves intact. Normal muscle tone, no cerebellar symptoms. Sensation  intact.  Psych: Awake and oriented X 3, normal affect, Insight and Judgment appropriate.      Garnet Sierras, NP 12:05 PM [facility]

## 2018-11-15 ENCOUNTER — Ambulatory Visit (INDEPENDENT_AMBULATORY_CARE_PROVIDER_SITE_OTHER): Payer: Medicare Other | Admitting: Adult Health Nurse Practitioner

## 2018-11-15 ENCOUNTER — Encounter: Payer: Self-pay | Admitting: Adult Health Nurse Practitioner

## 2018-11-15 ENCOUNTER — Other Ambulatory Visit: Payer: Self-pay

## 2018-11-15 VITALS — BP 110/66 | HR 71 | Temp 97.3°F | Ht 71.0 in | Wt 196.2 lb

## 2018-11-15 DIAGNOSIS — N183 Chronic kidney disease, stage 3 unspecified: Secondary | ICD-10-CM

## 2018-11-15 DIAGNOSIS — D692 Other nonthrombocytopenic purpura: Secondary | ICD-10-CM

## 2018-11-15 DIAGNOSIS — N184 Chronic kidney disease, stage 4 (severe): Secondary | ICD-10-CM | POA: Diagnosis not present

## 2018-11-15 DIAGNOSIS — E782 Mixed hyperlipidemia: Secondary | ICD-10-CM | POA: Diagnosis not present

## 2018-11-15 DIAGNOSIS — I480 Paroxysmal atrial fibrillation: Secondary | ICD-10-CM | POA: Diagnosis not present

## 2018-11-15 DIAGNOSIS — I10 Essential (primary) hypertension: Secondary | ICD-10-CM

## 2018-11-15 DIAGNOSIS — J984 Other disorders of lung: Secondary | ICD-10-CM | POA: Diagnosis not present

## 2018-11-15 DIAGNOSIS — I5032 Chronic diastolic (congestive) heart failure: Secondary | ICD-10-CM

## 2018-11-15 DIAGNOSIS — G4733 Obstructive sleep apnea (adult) (pediatric): Secondary | ICD-10-CM

## 2018-11-15 DIAGNOSIS — D6869 Other thrombophilia: Secondary | ICD-10-CM | POA: Diagnosis not present

## 2018-11-15 DIAGNOSIS — E559 Vitamin D deficiency, unspecified: Secondary | ICD-10-CM

## 2018-11-15 DIAGNOSIS — E1122 Type 2 diabetes mellitus with diabetic chronic kidney disease: Secondary | ICD-10-CM

## 2018-11-15 DIAGNOSIS — D509 Iron deficiency anemia, unspecified: Secondary | ICD-10-CM

## 2018-11-15 DIAGNOSIS — K2100 Gastro-esophageal reflux disease with esophagitis, without bleeding: Secondary | ICD-10-CM

## 2018-11-15 DIAGNOSIS — Z9989 Dependence on other enabling machines and devices: Secondary | ICD-10-CM

## 2018-11-15 DIAGNOSIS — Z794 Long term (current) use of insulin: Secondary | ICD-10-CM

## 2018-11-15 DIAGNOSIS — M1A9XX0 Chronic gout, unspecified, without tophus (tophi): Secondary | ICD-10-CM

## 2018-11-15 DIAGNOSIS — F3342 Major depressive disorder, recurrent, in full remission: Secondary | ICD-10-CM

## 2018-11-16 LAB — CBC WITH DIFFERENTIAL/PLATELET
Absolute Monocytes: 514 cells/uL (ref 200–950)
Basophils Absolute: 40 cells/uL (ref 0–200)
Basophils Relative: 0.5 %
Eosinophils Absolute: 103 cells/uL (ref 15–500)
Eosinophils Relative: 1.3 %
HCT: 38.4 % — ABNORMAL LOW (ref 38.5–50.0)
Hemoglobin: 12.7 g/dL — ABNORMAL LOW (ref 13.2–17.1)
Lymphs Abs: 798 cells/uL — ABNORMAL LOW (ref 850–3900)
MCH: 32.7 pg (ref 27.0–33.0)
MCHC: 33.1 g/dL (ref 32.0–36.0)
MCV: 99 fL (ref 80.0–100.0)
MPV: 9.4 fL (ref 7.5–12.5)
Monocytes Relative: 6.5 %
Neutro Abs: 6446 cells/uL (ref 1500–7800)
Neutrophils Relative %: 81.6 %
Platelets: 178 10*3/uL (ref 140–400)
RBC: 3.88 10*6/uL — ABNORMAL LOW (ref 4.20–5.80)
RDW: 13.4 % (ref 11.0–15.0)
Total Lymphocyte: 10.1 %
WBC: 7.9 10*3/uL (ref 3.8–10.8)

## 2018-11-16 LAB — COMPLETE METABOLIC PANEL WITH GFR
AG Ratio: 1.9 (calc) (ref 1.0–2.5)
ALT: 24 U/L (ref 9–46)
AST: 24 U/L (ref 10–35)
Albumin: 4.4 g/dL (ref 3.6–5.1)
Alkaline phosphatase (APISO): 113 U/L (ref 35–144)
BUN/Creatinine Ratio: 19 (calc) (ref 6–22)
BUN: 37 mg/dL — ABNORMAL HIGH (ref 7–25)
CO2: 29 mmol/L (ref 20–32)
Calcium: 9 mg/dL (ref 8.6–10.3)
Chloride: 105 mmol/L (ref 98–110)
Creat: 1.9 mg/dL — ABNORMAL HIGH (ref 0.70–1.18)
GFR, Est African American: 38 mL/min/{1.73_m2} — ABNORMAL LOW (ref >=60)
GFR, Est Non African American: 33 mL/min/{1.73_m2} — ABNORMAL LOW (ref >=60)
Globulin: 2.3 g/dL (calc) (ref 1.9–3.7)
Glucose, Bld: 156 mg/dL — ABNORMAL HIGH (ref 65–99)
Potassium: 4.1 mmol/L (ref 3.5–5.3)
Sodium: 144 mmol/L (ref 135–146)
Total Bilirubin: 0.7 mg/dL (ref 0.2–1.2)
Total Protein: 6.7 g/dL (ref 6.1–8.1)

## 2018-11-16 LAB — MAGNESIUM: Magnesium: 2.3 mg/dL (ref 1.5–2.5)

## 2018-11-16 LAB — LIPID PANEL
Cholesterol: 138 mg/dL (ref <200)
HDL: 28 mg/dL — ABNORMAL LOW (ref >=40)
LDL Cholesterol (Calc): 80 mg/dL (calc)
Non-HDL Cholesterol (Calc): 110 mg/dL (calc) (ref <130)
Total CHOL/HDL Ratio: 4.9 (calc) (ref <5.0)
Triglycerides: 205 mg/dL — ABNORMAL HIGH (ref <150)

## 2018-11-16 LAB — HEMOGLOBIN A1C
Hgb A1c MFr Bld: 7.1 % of total Hgb — ABNORMAL HIGH (ref <5.7)
Mean Plasma Glucose: 157 (calc)
eAG (mmol/L): 8.7 (calc)

## 2018-11-16 LAB — VITAMIN D 25 HYDROXY (VIT D DEFICIENCY, FRACTURES): Vit D, 25-Hydroxy: 57 ng/mL (ref 30–100)

## 2018-11-16 LAB — PROTIME-INR
INR: 2.6 — ABNORMAL HIGH
Prothrombin Time: 26 s — ABNORMAL HIGH (ref 9.0–11.5)

## 2018-11-18 ENCOUNTER — Encounter: Payer: Self-pay | Admitting: Adult Health Nurse Practitioner

## 2018-11-21 ENCOUNTER — Other Ambulatory Visit: Payer: Self-pay

## 2018-11-21 DIAGNOSIS — Z1211 Encounter for screening for malignant neoplasm of colon: Secondary | ICD-10-CM

## 2018-11-21 LAB — POC HEMOCCULT BLD/STL (HOME/3-CARD/SCREEN)
Card #2 Fecal Occult Blod, POC: NEGATIVE
Card #3 Fecal Occult Blood, POC: NEGATIVE
Fecal Occult Blood, POC: NEGATIVE

## 2018-12-02 ENCOUNTER — Other Ambulatory Visit: Payer: Self-pay | Admitting: Internal Medicine

## 2018-12-02 DIAGNOSIS — I480 Paroxysmal atrial fibrillation: Secondary | ICD-10-CM

## 2018-12-02 DIAGNOSIS — Q2112 Patent foramen ovale: Secondary | ICD-10-CM

## 2018-12-02 DIAGNOSIS — Q211 Atrial septal defect: Secondary | ICD-10-CM

## 2018-12-04 ENCOUNTER — Other Ambulatory Visit: Payer: Self-pay | Admitting: Internal Medicine

## 2018-12-04 DIAGNOSIS — E1122 Type 2 diabetes mellitus with diabetic chronic kidney disease: Secondary | ICD-10-CM

## 2018-12-04 DIAGNOSIS — Z0001 Encounter for general adult medical examination with abnormal findings: Secondary | ICD-10-CM

## 2018-12-04 MED ORDER — ACCU-CHEK AVIVA PLUS VI STRP: ORAL_STRIP | 3 refills | Status: DC

## 2018-12-09 DIAGNOSIS — G4733 Obstructive sleep apnea (adult) (pediatric): Secondary | ICD-10-CM | POA: Diagnosis not present

## 2018-12-16 ENCOUNTER — Encounter (INDEPENDENT_AMBULATORY_CARE_PROVIDER_SITE_OTHER): Payer: Medicare Other | Admitting: Ophthalmology

## 2018-12-16 ENCOUNTER — Other Ambulatory Visit: Payer: Self-pay

## 2018-12-16 DIAGNOSIS — H35033 Hypertensive retinopathy, bilateral: Secondary | ICD-10-CM

## 2018-12-16 DIAGNOSIS — H34832 Tributary (branch) retinal vein occlusion, left eye, with macular edema: Secondary | ICD-10-CM | POA: Diagnosis not present

## 2018-12-16 DIAGNOSIS — I10 Essential (primary) hypertension: Secondary | ICD-10-CM | POA: Diagnosis not present

## 2018-12-16 DIAGNOSIS — H43813 Vitreous degeneration, bilateral: Secondary | ICD-10-CM

## 2018-12-16 DIAGNOSIS — H338 Other retinal detachments: Secondary | ICD-10-CM | POA: Diagnosis not present

## 2018-12-18 ENCOUNTER — Encounter: Payer: Self-pay | Admitting: Internal Medicine

## 2018-12-18 NOTE — Patient Instructions (Signed)
Vit D  & Vit C 1,000 mg   are recommended to help protect  against the Covid-19 and other Corona viruses.    Also it's recommended  to take  Zinc 50 mg  to help  protect against the Covid-19   and best place to get  is also on [employer]  and don't pay more than 6-8 cents /pill !   ===================================== Coronavirus (COVID-19) Are you at risk?  Are you at risk for the Coronavirus (COVID-19)?  To be considered HIGH RISK for Coronavirus (COVID-19), you have to meet the following criteria:  . Traveled to [place], [place], [place], [place] or [place]; or in the [place] to [place], [place], [place]  . or [place]; and have fever, cough, and shortness of breath within the last 2 weeks of travel OR . Been in close contact with a person diagnosed with COVID-19 within the last 2 weeks and have  . fever, cough,and shortness of breath .  . IF YOU DO NOT MEET THESE CRITERIA, YOU ARE CONSIDERED LOW RISK FOR COVID-19.  What to do if you are HIGH RISK for COVID-19?  . If you are having a medical emergency, call 911. . Seek medical care right away. Before you go to a doctor's office, urgent care or emergency department, .  call ahead and tell them about your recent travel, contact with someone diagnosed with COVID-19  .  and your symptoms.  . You should receive instructions from your physician's office regarding next steps of care.  . When you arrive at healthcare provider, tell the healthcare staff immediately you have returned from  . visiting [place], [place], [place], [place] or [place]; or traveled in the [place] to [place], [place],  . [place] or [place] in the last two weeks or you have been in close contact with a person diagnosed with  . COVID-19 in the last 2 weeks.   . Tell the health care staff about your symptoms: fever, cough and shortness of breath. . After you have been seen by a medical provider, you will be either: o Tested for  (COVID-19) and discharged home on quarantine except to seek medical care if  o symptoms worsen, and asked to  - Stay home and avoid contact with others until you get your results (4-5 days)  - Avoid travel on public transportation if possible (such as bus, train, or airplane) or o Sent to the Emergency Department by EMS for evaluation, COVID-19 testing  and  o possible admission depending on your condition and test results.  What to do if you are LOW RISK for COVID-19?  Reduce your risk of any infection by using the same precautions used for avoiding the common cold or flu:  . Wash your hands often with soap and warm water for at least 20 seconds.  If soap and water are not readily available,  . use an alcohol-based hand sanitizer with at least 60% alcohol.  . If coughing or sneezing, cover your mouth and nose by coughing or sneezing into the elbow areas of your shirt or coat, .  into a tissue or into your sleeve (not your hands). . Avoid shaking hands with others and consider head nods or verbal greetings only. . Avoid touching your eyes, nose, or mouth with unwashed hands.  . Avoid close contact with people who are sick. . Avoid places or events with large numbers of people in one location, like concerts or sporting events. .   Carefully consider travel plans you have or are making. . If you are planning any travel outside or inside the [place], visit the CDC's Travelers' Health webpage for the latest health notices. . If you have some symptoms but not all symptoms, continue to monitor at home and seek medical attention  . if your symptoms worsen. . If you are having a medical emergency, call 911.   ++++++++++++++++++++++++++++++++ Recommend Adult Low Dose Aspirin or  coated  Aspirin 81 mg daily  To reduce risk of Colon Cancer 40 %,  Skin Cancer 26 % ,  Melanoma 46%  and  Pancreatic cancer 60% ++++++++++++++++++++++++++++++++ Vitamin D goal  is between 70-100.  Please make sure that  you are taking your Vitamin D as directed.  It is very important as a natural anti-inflammatory  helping hair, skin, and nails, as well as reducing stroke and heart attack risk.  It helps your bones and helps with mood. It also decreases numerous cancer risks so please take it as directed.  Low Vit D is associated with a 200-300% higher risk for CANCER  and 200-300% higher risk for HEART   ATTACK  &  STROKE.   ...................................... It is also associated with higher death rate at younger ages,  autoimmune diseases like Rheumatoid arthritis, Lupus, Multiple Sclerosis.    Also many other serious conditions, like depression, Alzheimer's Dementia, infertility, muscle aches, fatigue, fibromyalgia - just to name a few. ++++++++++++++++++++ Recommend the book "The END of DIETING" by Dr Joel Fuhrman  & the book "The END of DIABETES " by Dr Joel Fuhrman At [employer] - get book & Audio CD's    Being diabetic has a  300% increased risk for heart attack, stroke, cancer, and alzheimer- type vascular dementia. It is very important that you work harder with diet by avoiding all foods that are white. Avoid white rice (brown & wild rice is OK), white potatoes (sweetpotatoes in moderation is OK), White bread or wheat bread or anything made out of white flour like bagels, donuts, rolls, buns, biscuits, cakes, pastries, cookies, pizza crust, and pasta (made from white flour & egg whites) - vegetarian pasta or spinach or wheat pasta is OK. Multigrain breads like Arnold's or Pepperidge Farm, or multigrain sandwich thins or flatbreads.  Diet, exercise and weight loss can reverse and cure diabetes in the early stages.  Diet, exercise and weight loss is very important in the control and prevention of complications of diabetes which affects every system in your body, ie. Brain - dementia/stroke, eyes - glaucoma/blindness, heart - heart attack/heart failure, kidneys - dialysis, stomach - gastric paralysis,  intestines - malabsorption, nerves - severe painful neuritis, circulation - gangrene & loss of a leg(s), and finally cancer and Alzheimers.    I recommend avoid fried & greasy foods,  sweets/candy, white rice (brown or wild rice or Quinoa is OK), white potatoes (sweet potatoes are OK) - anything made from white flour - bagels, doughnuts, rolls, buns, biscuits,white and wheat breads, pizza crust and traditional pasta made of white flour & egg white(vegetarian pasta or spinach or wheat pasta is OK).  Multi-grain bread is OK - like multi-grain flat bread or sandwich thins. Avoid alcohol in excess. Exercise is also important.    Eat all the vegetables you want - avoid meat, especially red meat and dairy - especially cheese.  Cheese is the most concentrated form of trans-fats which is the worst thing to clog up our arteries. Veggie cheese is OK which can be   found in the fresh produce section at [employer] or [employer] or [employer]  +++++++++++++++++++++ DASH Eating Plan  DASH stands for "Dietary Approaches to Stop Hypertension."   The DASH eating plan is a healthy eating plan that has been shown to reduce high blood pressure (hypertension). Additional health benefits may include reducing the risk of type 2 diabetes mellitus, heart disease, and stroke. The DASH eating plan may also help with weight loss. WHAT DO I NEED TO KNOW ABOUT THE DASH EATING PLAN? For the DASH eating plan, you will follow these general guidelines:  Choose foods with a percent daily value for sodium of less than 5% (as listed on the food label).  Use salt-free seasonings or herbs instead of table salt or sea salt.  Check with your health care provider or pharmacist before using salt substitutes.  Eat lower-sodium products, often labeled as "lower sodium" or "no salt added."  Eat fresh foods.  Eat more vegetables, fruits, and low-fat dairy products.  Choose whole grains. Look for the word "whole" as the first word in  the ingredient list.  Choose fish   Limit sweets, desserts, sugars, and sugary drinks.  Choose heart-healthy fats.  Eat veggie cheese   Eat more home-cooked food and less restaurant, buffet, and fast food.  Limit fried foods.  Cook foods using methods other than frying.  Limit canned vegetables. If you do use them, rinse them well to decrease the sodium.  When eating at a restaurant, ask that your food be prepared with less salt, or no salt if possible.                      WHAT FOODS CAN I EAT? Read Dr Joel Fuhrman's books on The End of Dieting & The End of Diabetes  Grains Whole grain or whole wheat bread. Brown rice. Whole grain or whole wheat pasta. Quinoa, bulgur, and whole grain cereals. Low-sodium cereals. Corn or whole wheat flour tortillas. Whole grain cornbread. Whole grain crackers. Low-sodium crackers.  Vegetables Fresh or frozen vegetables (raw, steamed, roasted, or grilled). Low-sodium or reduced-sodium tomato and vegetable juices. Low-sodium or reduced-sodium tomato sauce and paste. Low-sodium or reduced-sodium canned vegetables.   Fruits All fresh, canned (in natural juice), or frozen fruits.  Protein Products  All fish and seafood.  Dried beans, peas, or lentils. Unsalted nuts and seeds. Unsalted canned beans.  Dairy Low-fat dairy products, such as skim or 1% milk, 2% or reduced-fat cheeses, low-fat ricotta or cottage cheese, or plain low-fat yogurt. Low-sodium or reduced-sodium cheeses.  Fats and Oils Tub margarines without trans fats. Light or reduced-fat mayonnaise and salad dressings (reduced sodium). Avocado. Safflower, olive, or canola oils. Natural peanut or almond butter.  Other Unsalted popcorn and pretzels. The items listed above may not be a complete list of recommended foods or beverages. Contact your dietitian for more options.  +++++++++++++++  WHAT FOODS ARE NOT RECOMMENDED? Grains/ White flour or wheat flour White bread. White pasta.  White rice. Refined cornbread. Bagels and croissants. Crackers that contain trans fat.  Vegetables  Creamed or fried vegetables. Vegetables in a . Regular canned vegetables. Regular canned tomato sauce and paste. Regular tomato and vegetable juices.  Fruits Dried fruits. Canned fruit in light or heavy syrup. Fruit juice.  Meat and Other Protein Products Meat in general - RED meat & White meat.  Fatty cuts of meat. Ribs, chicken wings, all processed meats as bacon, sausage, bologna, salami, fatback, hot dogs, bratwurst and packaged   luncheon meats.  Dairy Whole or 2% milk, cream, half-and-half, and cream cheese. Whole-fat or sweetened yogurt. Full-fat cheeses or blue cheese. Non-dairy creamers and whipped toppings. Processed cheese, cheese spreads, or cheese curds.  Condiments Onion and garlic salt, seasoned salt, table salt, and sea salt. Canned and packaged gravies. Worcestershire sauce. Tartar sauce. Barbecue sauce. Teriyaki sauce. Soy sauce, including reduced sodium. Steak sauce. Fish sauce. Oyster sauce. Cocktail sauce. Horseradish. Ketchup and mustard. Meat flavorings and tenderizers. Bouillon cubes. Hot sauce. Tabasco sauce. Marinades. Taco seasonings. Relishes.  Fats and Oils Butter, stick margarine, lard, shortening and bacon fat. Coconut, palm kernel, or palm oils. Regular salad dressings.  Pickles and olives. Salted popcorn and pretzels.  The items listed above may not be a complete list of foods and beverages to avoid.   +++++++++++++++++++++++++++++++++++++++++++ Bleeding Precautions When on Anticoagulant Therapy  Anticoagulant therapy, also called blood thinner therapy, is medicine that helps to prevent and treat blood clots. The medicine works by stopping blood clots from forming or growing. Blood clots that form in your blood vessels can be dangerous. They can break loose and travel to the heart, lungs, or brain. This increases the risk of a heart attack, stroke, or blocked  lung artery (pulmonary embolism). Anticoagulants also increase the risk of bleeding. Try to protect yourself from cuts and other injuries that can cause bleeding. It is important to take anticoagulants exactly as told by your health care provider. Why do I need to be on anticoagulant therapy? You may need this medicine if you are at risk of developing a blood clot. Conditions that increase your risk of a blood clot include:  Being born with heart disease or a heart malformation (congenital heart disease).  Developing heart disease.  Having had surgery, such as valve replacement.  Having had a serious accident or other type of severe injury (trauma).  Having certain types of cancer.  Having certain diseases that can increase blood clotting.  Having a high risk of stroke or heart attack.  Having atrial fibrillation (AF). What are the common anticoagulant medicines? There are several types of anticoagulant medicines. The most common types are:  Medicines that you take by mouth (oral medicines), such as: ? Warfarin. ? Novel oral anticoagulants (NOACs), such as: ? Direct thrombin inhibitors (dabigatran). ? Factor Xa inhibitors (apixaban, edoxaban, and rivaroxaban).  Injections, such as: ? Unfractionated heparin. ? Low molecular weight heparin. These anticoagulants work in different ways to prevent blood clots. They also have different risks and side effects. What do I need to remember while on anticoagulant therapy? Taking anticoagulants  Take your medicine at the same time every day. If you forget to take your medicine, take it as soon as you remember. Do not double your dosage of medicine if you miss a whole day. Take your normal dose and call your health care provider.  Do not stop taking your medicine unless your health care provider approves. Stopping the medicine can increase your risk of developing a blood clot. Taking other medicines  Take over-the-counter and  prescriptions medicines only as told by your health care provider.  Do not take over-the-counter NSAIDs, including aspirin and ibuprofen, while you are on anticoagulant therapy. These medicines increase your risk of dangerous bleeding.  Get approval from your health care provider before you start taking any new medicines, vitamins, or herbal products. Some of these could interfere with your therapy. General instructions  Keep all follow-up visits as told by your health care provider. This is important.  If you are pregnant or trying to get pregnant, talk with a health care provider about anticoagulants. Some of these medicines are not safe to take during pregnancy.  Tell all health care providers, including your dentist, that you are on anticoagulant therapy. It is especially important to tell providers before you have any surgery, medical procedures, or dental work done. What precautions should I take?   Be very careful when using knives, scissors, or other sharp objects.  Use an electric razor instead of a blade.  Do not use toothpicks.  Use a soft-bristled toothbrush. Brush your teeth gently.  Always wear shoes outdoors and wear slippers indoors.  Be careful when cutting your fingernails and toenails.  Place bath mats in the bathroom. If possible, install handrails as well.  Wear gloves while you do yard work.  Wear your seat belt.  Prevent falls by removing loose rugs and extension cords from areas where you walk. Use a cane or walker if you need it.  Avoid constipation by: ? Drinking enough fluid to keep your urine clear or pale yellow. ? Eating foods that are high in fiber, such as fresh fruits and vegetables, whole grains, and beans. ? Limiting foods that are high in fat and processed sugars, such as fried and sweet foods.  Do not play contact sports or participate in other activities that have a high risk for injury. What other precautions are important if on warfarin  therapy? If you are taking a type of anticoagulant called warfarin, make sure you:  Work with a diet and nutrition specialist (dietitian) to make an eating plan. Do not make any sudden changes to your diet after you have started your eating plan.  Do not drink alcohol. It can interfere with your medicine and increase your risk of an injury that causes bleeding.  Get regular blood tests as told by your health care provider. What are some questions to ask my health care provider?  Why do I need anticoagulant therapy?  What is the best anticoagulant therapy for my condition?  How long will I need anticoagulant therapy?  What are the side effects of anticoagulant therapy?  When should I take my medicine? What should I do if I forget to take it?  Will I need to have regular blood tests?  Do I need to change my diet? Are there foods or drinks that I should avoid?  What activities are safe for me?  What should I do if I want to get pregnant? Contact a health care provider if:  You miss a dose of medicine: ? And you are not sure what to do. ? For more than one day.  You have: ? Menstrual bleeding that is heavier than normal. ? Bloody or brown urine. ? Easy bruising. ? Black and tarry stool or bright red stool. ? Side effects from your medicine.  You feel weak or dizzy.  You become pregnant. Get help right away if:  You have bleeding that will not stop within 20 minutes from: ? The nose. ? The gums. ? A cut on the skin.  You have a severe headache or stomachache.  You vomit or cough up blood.  You fall or hit your head. Summary  Anticoagulant therapy, also called blood thinner therapy, is medicine that helps to prevent and treat blood clots.  Anticoagulants work in different ways to prevent blood clots. They also have different risks and side effects.  Talk with your health care provider about any precautions  that you should take while on anticoagulant  therapy. This information is not intended to replace advice given to you by your health care provider. Make sure you discuss any questions you have with your health care provider. Document Released: 12/03/2014 Document Revised: 04/13/2018 Document Reviewed: 03/10/2016 [employer] Patient Education  [place].

## 2018-12-18 NOTE — Progress Notes (Signed)
History of Present Illness:      This very nice 78 y.o. MWM presents for 3 month follow up with HTN, ASCAD/chronic CHF, hx/o ASPVD  HLD, Pre-Diabetes, Gout, COPD/OSA overlap  and Vitamin D Deficiency.       Patient is treated for HTN & BP has been controlled at home. Patient has hx/o CABG w/MAZE procredure (2015) and hx  Pulm HTN, chronic Heart Failure and hx/o RFA for Afib/AFlut on Coumadin. Todays BP is at goal - 136/78. Patient has had no complaints of any cardiac type chest pain, palpitations, dyspnea / orthopnea / PND, dizziness, claudication, or dependent edema.      Hyperlipidemia is controlled with diet & meds. Patient denies myalgias or other med SEs. Last Lipids were near goal albeit elevated Trigs:  Lab Results  Component Value Date   CHOL 138 11/15/2018   HDL 28 (L) 11/15/2018   LDLCALC 80 11/15/2018   TRIG 205 (H) 11/15/2018   CHOLHDL 4.9 11/15/2018        Also, the patient has history of T2_NIDDM / CKD3  (GFR 33)  and is on bid Novolin 70/30. has had no symptoms of reactive hypoglycemia, diabetic polys, paresthesias or visual blurring.  Last A1c was not at goal:  Lab Results  Component Value Date   HGBA1C 7.1 (H) 11/15/2018   Wt Readings from Last 3 Encounters:  12/19/18 193 lb 12.8 oz (87.9 kg)  11/15/18 196 lb 3.2 oz (89 kg)  10/18/18 197 lb (89.4 kg)        Further, the patient also has history of Vitamin D Deficiency  ("39" / 2008)   and supplements vitamin D without any suspected side-effects. Last vitamin D was near goal (70-100):   Lab Results  Component Value Date   VD25OH 57 11/15/2018    Current Outpatient Medications on File Prior to Visit  Medication Sig   acetaminophen (TYLENOL) 500 MG tablet Take 1,000 mg by mouth every 6 (six) hours as needed for moderate pain or headache.    ALPRAZolam (XANAX) 1 MG tablet Take 0.5 tablets (0.5 mg total) by mouth at bedtime as needed for sleep.   Alum Hydroxide-Mag Carbonate (GAVISCON PO) Take 4  tablets by mouth daily as needed (acid reflux).    aspirin EC 81 MG tablet Take 81 mg by mouth daily.   atorvastatin (LIPITOR) 40 MG tablet Take 0.5 tablets (20 mg total) by mouth daily at 6 PM.   B Complex Vitamins (VITAMIN B COMPLEX PO) Take 1 tablet by mouth at bedtime.    bacitracin 500 UNIT/GM ointment Apply 1 application topically daily as needed for wound care.   benzonatate (TESSALON) 200 MG capsule Take 1 capsule (200 mg total) by mouth 3 (three) times daily as needed for cough (Max: 634m per day).   Blood Glucose Monitoring Suppl (ACCU-CHEK AVIVA PLUS) w/Device KIT Check blood sugar 1 time  daily   Blood Glucose Monitoring Suppl DEVI Test blood sugar up to three times a day or as directed.   carvedilol (COREG) 6.25 MG tablet Take 1 tablet 3 x /day for BP & Heart   Cholecalciferol (VITAMIN D PO) Take 5,000 Units by mouth daily.    colchicine 0.6 MG tablet Take 0.6 mg by mouth daily as needed (for gout flare up).    diphenhydrAMINE (BENADRYL) 25 MG tablet Take 25 mg by mouth at bedtime as needed for allergies.    ferrous sulfate 325 (65 FE) MG tablet Take 325 mg  by mouth 2 (two) times daily with a meal.   fluticasone (FLONASE) 50 MCG/ACT nasal spray Place 1 spray into both nostrils daily as needed for allergies.   glucose blood (ACCU-CHEK AVIVA PLUS) test strip CHECK BLOOD GLUCOSE 3 TIMES DAILY.   insulin NPH-regular Human (70-30) 100 UNIT/ML injection 45 units AM, 30-35 units PM (Patient taking differently: 47 units AM, 30-35 units PM)   lidocaine (LIDODERM) 5 % Place 1 patch onto the skin daily as needed (for pain). Remove & Discard patch within 12 hours or as directed by MD    loratadine (CLARITIN) 10 MG tablet Take 10 mg by mouth daily as needed for allergies.    metolazone (ZAROXOLYN) 5 MG tablet TAKE 1 TABLET BY MOUTH  DAILY AS NEEDED (WEIGHT  GAIN AND EDEMA)   Multiple Vitamins-Minerals (PRESERVISION AREDS 2) CAPS Take 1 capsule by mouth 2 (two) times daily.     polyvinyl alcohol (ARTIFICIAL TEARS) 1.4 % ophthalmic solution Place 1 drop into both eyes daily as needed for dry eyes.   potassium chloride SA (KLOR-CON) 20 MEQ tablet Take 2 tablets 3 x /day for potassium   sertraline (ZOLOFT) 100 MG tablet Take 1 tablet Daily for Mood   torsemide (DEMADEX) 100 MG tablet TAKE 1 TABLET BY MOUTH IN  THE MORNING AND 1/2 TABLET  IN THE EVENING IF NEEDED  FOR WEIGHT GAIN (Patient taking differently: Takes 1.5 tablets in the morning)   warfarin (COUMADIN) 5 MG tablet Take 1 to 2 tablets Daily to Prevent Blood Clots   allopurinol (ZYLOPRIM) 300 MG tablet Take 1 tablet daily to prevent gout (Patient taking differently: Take 300 mg by mouth daily. )   azelastine (ASTELIN) 0.1 % nasal spray PLACE 2 SPRAYS INTO BOTH NOSTRILS 2 (TWO) TIMES DAILY. USE IN EACH NOSTRIL AS DIRECTED (Patient taking differently: Place 2 sprays into both nostrils as needed. Use in each nostril as directed)   No current facility-administered medications on file prior to visit.    Allergies  Allergen Reactions   Sunflower Seed [Sunflower Oil] Swelling and Other (See Comments)    Tongue and lip swelling   Horse-Derived Products Other (See Comments)    Per allergy skin test UNSPECIFIED REACTION    Tetanus Toxoids Other (See Comments)    Per allergy skin test   Tetanus Toxoid     Other reaction(s): Other (See Comments) Rash(horse serum)    PMHx:   Past Medical History:  Diagnosis Date   Adrenal adenoma    Anemia    Anxiety    Arthritis    Atypical atrial flutter ([place]) 8/15, 10/15   a. DCCV 08/2013. b. s/p RFA 10/2013.   Basal cell carcinoma    CAD (coronary artery disease)    a. 04/2013 CABG x 2: LIMA to LAD, SVG to RI, EVH via R thigh.   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 s/p MAZE 04/2013 in setting of CABG. b. Amio stopped in 10/2013 after flutter ablation.   PFO (patent foramen ovale)    a. Small PFO by TEE 10/2013.   Pleural effusion, left    a. s/p thoracentesis 05/2013.   Respiratory failure ([place])    a. Hypoxia 10/2013 - required supp O2 as inpatient, did not require it at discharge.   S/P Maze operation for atrial fibrillation    a. 04/2013: Complete bilateral atrial lesion set using cryothermy and bipolar radiofrequency ablation with clipping of LA appendage (@ time of CABG)   S/P Maze operation for atrial fibrillation 04/05/2013   Complete bilateral atrial lesion set using cryothermy and bipolar radiofrequency ablation with clipping of LA appendage via median sternotomy approach    Immunization History  Administered Date(s) Administered   DT 08/05/2015   Influenza Split 10/25/2012   Influenza, High Dose Seasonal PF 09/26/2013, 09/27/2014, 10/03/2015, 10/05/2016, 11/11/2017, 10/10/2018   Pneumococcal Conjugate-13 01/30/2014   Pneumococcal Polysaccharide-23 10/20/2011   Td 01/06/2000   Past Surgical History:  Procedure Laterality Date   APPENDECTOMY  03/05/2017   laproscopic   ATRIAL FIBRILLATION ABLATION N/A 10/26/2013   Procedure: ATRIAL FIBRILLATION ABLATION;  Surgeon: Coralyn Mark, MD;  Location: [place] CATH LAB;  Service: Cardiovascular;  Laterality: N/A;   BASAL CELL CARCINOMA EXCISION     x3 on face   CARDIAC CATHETERIZATION      myocardial bridge but no cad   CARDIOVERSION N/A 08/23/2013   Procedure: CARDIOVERSION;  Surgeon: Sanda Klein, MD;  Location: [place];  Service: Cardiovascular;  Laterality: N/A;   CARPOMETACARPEL SUSPENSION PLASTY Left 02/14/2014   Procedure: CARPOMETACARPEL ([place]) SUSPENSIONPLASTY THUMB  WITH  ABDUCTOR POLLICIS LONGUS TRANSFER AND STENOSING TENOSYNOVITIS RELEASE LEFT WRIST;  Surgeon: Charlotte Crumb, MD;  Location: [place];  Service: Orthopedics;  Laterality: Left;   CATARACT EXTRACTION     bilateral   COLONOSCOPY WITH PROPOFOL N/A 01/26/2017   Procedure: COLONOSCOPY WITH PROPOFOL;  Surgeon: Doran Stabler, MD;  Location: [facility] ENDOSCOPY;  Service: Gastroenterology;  Laterality: N/A;   CORONARY ARTERY BYPASS GRAFT N/A 04/05/2013   Procedure: CORONARY ARTERY BYPASS GRAFTING (CABG) TIMES TWO USING LEFT INTERNAL MAMMARY ARTERY AND RIGHT SAPHENOUS LEG VEIN HARVESTED ENDOSCOPICALLY;  Surgeon: Rexene Alberts, MD;  Location: [place];  Service: Open Heart Surgery;  Laterality: N/A;   ESOPHAGOGASTRODUODENOSCOPY (EGD) WITH PROPOFOL N/A 01/26/2017   Procedure: ESOPHAGOGASTRODUODENOSCOPY (EGD) WITH PROPOFOL;  Surgeon: Doran Stabler, MD;  Location: [facility];  Service: Gastroenterology;  Laterality: N/A;   GREAT TOE ARTHRODESIS, INTERPHALANGEAL JOINT     Right foot   INTRAOPERATIVE TRANSESOPHAGEAL ECHOCARDIOGRAM N/A 04/05/2013   Procedure: INTRAOPERATIVE TRANSESOPHAGEAL ECHOCARDIOGRAM;  Surgeon: Rexene Alberts, MD;  Location: [place];  Service: Open Heart Surgery;  Laterality: N/A;   LAPAROSCOPIC APPENDECTOMY N/A 03/05/2017   Procedure: APPENDECTOMY LAPAROSCOPIC;  Surgeon: Judeth Horn, MD;  Location: [place];  Service: General;  Laterality: N/A;   LEFT HEART CATHETERIZATION WITH CORONARY ANGIOGRAM N/A 03/07/2013   Procedure: LEFT HEART CATHETERIZATION WITH CORONARY ANGIOGRAM;  Surgeon: Burnell Blanks, MD;  Location: [facility] CATH LAB;  Service: Cardiovascular;  Laterality:  N/A;   MAZE N/A 04/05/2013   Procedure: MAZE;  Surgeon: Rexene Alberts, MD;  Location: [place];  Service: Open Heart Surgery;  Laterality: N/A;   Polinydal  cyst     Removed   POLYPECTOMY     Retina repair-right     RIGHT HEART CATHETERIZATION N/A 05/03/2014   Procedure: RIGHT HEART CATH;  Surgeon: Jolaine Artist, MD;  Location: [facility];  Service: Cardiovascular;  Laterality: N/A;   TEE WITHOUT CARDIOVERSION N/A 08/23/2013   Procedure: TRANSESOPHAGEAL ECHOCARDIOGRAM (TEE);  Surgeon: Sanda Klein, MD;  Location: [facility] ENDOSCOPY;  Service: Cardiovascular;  Laterality: N/A;   TEE WITHOUT CARDIOVERSION N/A 10/26/2013   Procedure: TRANSESOPHAGEAL ECHOCARDIOGRAM (TEE);  Surgeon: Sueanne Margarita, MD;  Location: [facility] ENDOSCOPY;  Service: Cardiovascular;  Laterality: N/A;   TEE WITHOUT CARDIOVERSION N/A 06/05/2014   Procedure: TRANSESOPHAGEAL ECHOCARDIOGRAM (TEE);  Surgeon: Thayer Headings, MD;  Location: [facility] ENDOSCOPY;  Service: Cardiovascular;  Laterality: N/A;   TRAPEZIUM RESECTION      FHx:    Reviewed / unchanged  SHx:    Reviewed / unchanged   Systems Review:  Constitutional: Denies fever, chills, wt changes, headaches, insomnia, fatigue, night sweats, change in appetite. Eyes: Denies redness, blurred vision, diplopia, discharge, itchy, watery eyes.  ENT: Denies discharge, congestion, post nasal drip, epistaxis, sore throat, earache, hearing loss, dental pain, tinnitus, vertigo, sinus pain, snoring.  CV: Denies chest pain, palpitations, irregular heartbeat, syncope, dyspnea, diaphoresis, orthopnea, PND, claudication or edema. Respiratory: denies cough, dyspnea, DOE, pleurisy, hoarseness, laryngitis, wheezing.  Gastrointestinal: Denies dysphagia, odynophagia, heartburn, reflux, water brash, abdominal pain or cramps, nausea, vomiting, bloating, diarrhea, constipation, hematemesis, melena, hematochezia  or hemorrhoids. Genitourinary: Denies dysuria, frequency, urgency, nocturia,  hesitancy, discharge, hematuria or flank pain. Musculoskeletal: Denies arthralgias, myalgias, stiffness, jt. swelling, pain, limping or strain/sprain.  Skin: Denies pruritus, rash, hives, warts, acne, eczema or change in skin lesion(s). Neuro: No weakness, tremor, incoordination, spasms, paresthesia or pain. Psychiatric: Denies confusion, memory loss or sensory loss. Endo: Denies change in weight, skin or hair change.  Heme/Lymph: No excessive bleeding, bruising or enlarged lymph nodes.  Physical Exam  BP 136/78    Pulse 75    Temp (!) 97.4 F (36.3 C)    Resp 18    Ht _0  (1.803 m)    Wt 193 lb 12.8 oz (87.9 kg)    BMI 27.03 kg/m   Appears  well nourished, well groomed  and in no distress.  Eyes: PERRLA, EOMs, conjunctiva no swelling or erythema. Sinuses: No frontal/maxillary tenderness ENT/Mouth: EAC's clear, TM's nl w/o erythema, bulging. Nares clear w/o erythema, swelling, exudates. Oropharynx clear without erythema or exudates. Oral hygiene is good. Tongue normal, non obstructing. Hearing intact.  Neck: Supple. Thyroid not palpable. Car 2+/2+ without bruits, nodes or JVD. Chest: Respirations nl with BS clear & equal w/o rales, rhonchi, wheezing or stridor.  Cor: Heart sounds normal w/ regular rate and rhythm without sig. murmurs, gallops, clicks or rubs. Peripheral pulses normal and equal  without edema.  Abdomen: Soft & bowel sounds normal. Non-tender w/o guarding, rebound, hernias, masses or organomegaly.  Lymphatics: Unremarkable.  Musculoskeletal: Full ROM all peripheral extremities, joint stability, 5/5 strength and normal gait.  Skin: Warm, dry without exposed rashes, lesions or ecchymosis apparent.  Neuro: Cranial nerves intact, reflexes equal bilaterally. Sensory-motor testing grossly intact. Tendon reflexes grossly intact.  Pysch: Alert & oriented x 3.  Insight and judgement nl & appropriate. No ideations.  Assessment and Plan:  1. Essential hypertension  - Continue  medication, monitor blood pressure at home.  - Continue DASH diet.  Reminder to go to the ER if any CP,  SOB, nausea, dizziness, severe  HA, changes vision/speech.  - CBC with Diff - COMPLETE METABOLIC PANEL WITH GFR  2. Hyperlipidemia associated with type 2 diabetes mellitus ([place])  - Continue diet/meds, exercise,& lifestyle modifications.  - Continue monitor periodic cholesterol/liver & renal functions   3. Controlled type 2 diabetes mellitus with stage 3 chronic kidney disease,  without long-term current use of insulin (HCC)  - Continue diet, exercise  - Lifestyle modifications.  - Monitor appropriate labs.  - COMPLETE METABOLIC PANEL WITH GFR  4. Vitamin D deficiency  - Continue supplementation.  5. PVD (peripheral vascular disease) ([place])   6. Chronic obstructive pulmonary disease ([place])   7. PAF (paroxysmal atrial fibrillation) (HCC)  - PT (Prothrombin Time)  8. Acquired thrombophilia ([place])  - PT (Prothrombin Time)  9. Chronic anticoagulation - PT (Prothrombin Time)  10. Medication management  - CBC with Diff - COMPLETE METABOLIC PANEL WITH GFR        Discussed  regular exercise, BP monitoring, weight control to achieve/maintain BMI less than 25 and discussed med and SE's. Recommended labs to assess and monitor clinical status with further disposition pending results of labs.  I discussed the assessment and treatment plan with the patient. The patient was provided an opportunity to ask questions and all were answered. The patient agreed with the plan and demonstrated an understanding of the instructions.  I provided over 30 minutes of exam, counseling, chart review and  complex critical decision making.  Kirtland Bouchard, MD

## 2018-12-19 ENCOUNTER — Ambulatory Visit (INDEPENDENT_AMBULATORY_CARE_PROVIDER_SITE_OTHER): Payer: Medicare Other | Admitting: Internal Medicine

## 2018-12-19 ENCOUNTER — Other Ambulatory Visit: Payer: Self-pay

## 2018-12-19 ENCOUNTER — Encounter: Payer: Self-pay | Admitting: Internal Medicine

## 2018-12-19 VITALS — BP 136/78 | HR 75 | Temp 97.4°F | Resp 18 | Ht 71.0 in | Wt 193.8 lb

## 2018-12-19 DIAGNOSIS — N183 Chronic kidney disease, stage 3 unspecified: Secondary | ICD-10-CM

## 2018-12-19 DIAGNOSIS — E559 Vitamin D deficiency, unspecified: Secondary | ICD-10-CM

## 2018-12-19 DIAGNOSIS — E1122 Type 2 diabetes mellitus with diabetic chronic kidney disease: Secondary | ICD-10-CM

## 2018-12-19 DIAGNOSIS — I10 Essential (primary) hypertension: Secondary | ICD-10-CM | POA: Diagnosis not present

## 2018-12-19 DIAGNOSIS — E1169 Type 2 diabetes mellitus with other specified complication: Secondary | ICD-10-CM

## 2018-12-19 DIAGNOSIS — I739 Peripheral vascular disease, unspecified: Secondary | ICD-10-CM

## 2018-12-19 DIAGNOSIS — D6869 Other thrombophilia: Secondary | ICD-10-CM

## 2018-12-19 DIAGNOSIS — E785 Hyperlipidemia, unspecified: Secondary | ICD-10-CM

## 2018-12-19 DIAGNOSIS — I480 Paroxysmal atrial fibrillation: Secondary | ICD-10-CM | POA: Diagnosis not present

## 2018-12-19 DIAGNOSIS — Z79899 Other long term (current) drug therapy: Secondary | ICD-10-CM

## 2018-12-19 DIAGNOSIS — Z7901 Long term (current) use of anticoagulants: Secondary | ICD-10-CM

## 2018-12-19 DIAGNOSIS — J449 Chronic obstructive pulmonary disease, unspecified: Secondary | ICD-10-CM

## 2018-12-20 ENCOUNTER — Other Ambulatory Visit: Payer: Self-pay | Admitting: Internal Medicine

## 2018-12-20 DIAGNOSIS — D500 Iron deficiency anemia secondary to blood loss (chronic): Secondary | ICD-10-CM

## 2018-12-20 DIAGNOSIS — Z7901 Long term (current) use of anticoagulants: Secondary | ICD-10-CM

## 2018-12-20 DIAGNOSIS — I480 Paroxysmal atrial fibrillation: Secondary | ICD-10-CM

## 2018-12-20 DIAGNOSIS — Z79899 Other long term (current) drug therapy: Secondary | ICD-10-CM

## 2018-12-20 LAB — COMPLETE METABOLIC PANEL WITH GFR
AG Ratio: 1.4 (calc) (ref 1.0–2.5)
ALT: 23 U/L (ref 9–46)
AST: 21 U/L (ref 10–35)
Albumin: 4.1 g/dL (ref 3.6–5.1)
Alkaline phosphatase (APISO): 120 U/L (ref 35–144)
BUN/Creatinine Ratio: 18 (calc) (ref 6–22)
BUN: 30 mg/dL — ABNORMAL HIGH (ref 7–25)
CO2: 29 mmol/L (ref 20–32)
Calcium: 8.8 mg/dL (ref 8.6–10.3)
Chloride: 104 mmol/L (ref 98–110)
Creat: 1.63 mg/dL — ABNORMAL HIGH (ref 0.70–1.18)
GFR, Est African American: 46 mL/min/{1.73_m2} — ABNORMAL LOW (ref >=60)
GFR, Est Non African American: 39 mL/min/{1.73_m2} — ABNORMAL LOW (ref >=60)
Globulin: 2.9 g/dL (calc) (ref 1.9–3.7)
Glucose, Bld: 83 mg/dL (ref 65–99)
Potassium: 4.1 mmol/L (ref 3.5–5.3)
Sodium: 143 mmol/L (ref 135–146)
Total Bilirubin: 0.8 mg/dL (ref 0.2–1.2)
Total Protein: 7 g/dL (ref 6.1–8.1)

## 2018-12-20 LAB — CBC WITH DIFFERENTIAL/PLATELET
Absolute Monocytes: 725 cells/uL (ref 200–950)
Basophils Absolute: 39 cells/uL (ref 0–200)
Basophils Relative: 0.4 %
Eosinophils Absolute: 206 cells/uL (ref 15–500)
Eosinophils Relative: 2.1 %
HCT: 33.6 % — ABNORMAL LOW (ref 38.5–50.0)
Hemoglobin: 11.3 g/dL — ABNORMAL LOW (ref 13.2–17.1)
Lymphs Abs: 970 cells/uL (ref 850–3900)
MCH: 32.5 pg (ref 27.0–33.0)
MCHC: 33.6 g/dL (ref 32.0–36.0)
MCV: 96.6 fL (ref 80.0–100.0)
MPV: 9.2 fL (ref 7.5–12.5)
Monocytes Relative: 7.4 %
Neutro Abs: 7860 cells/uL — ABNORMAL HIGH (ref 1500–7800)
Neutrophils Relative %: 80.2 %
Platelets: 236 10*3/uL (ref 140–400)
RBC: 3.48 10*6/uL — ABNORMAL LOW (ref 4.20–5.80)
RDW: 13.5 % (ref 11.0–15.0)
Total Lymphocyte: 9.9 %
WBC: 9.8 10*3/uL (ref 3.8–10.8)

## 2018-12-20 LAB — PROTIME-INR
INR: 4.5 — ABNORMAL HIGH
Prothrombin Time: 42.4 s — ABNORMAL HIGH (ref 9.0–11.5)

## 2018-12-26 ENCOUNTER — Other Ambulatory Visit: Payer: Self-pay

## 2018-12-26 ENCOUNTER — Other Ambulatory Visit: Payer: Self-pay | Admitting: Internal Medicine

## 2018-12-26 ENCOUNTER — Other Ambulatory Visit: Payer: Medicare Other

## 2018-12-26 DIAGNOSIS — I480 Paroxysmal atrial fibrillation: Secondary | ICD-10-CM | POA: Diagnosis not present

## 2018-12-26 DIAGNOSIS — Z7901 Long term (current) use of anticoagulants: Secondary | ICD-10-CM | POA: Diagnosis not present

## 2018-12-26 NOTE — Addendum Note (Signed)
Addended by: Nena Jordan on: 12/26/2018 02:12 PM   Modules accepted: Orders

## 2018-12-27 LAB — PROTIME-INR
INR: 1.1
Prothrombin Time: 11.9 s — ABNORMAL HIGH (ref 9.0–11.5)

## 2019-01-02 ENCOUNTER — Ambulatory Visit (INDEPENDENT_AMBULATORY_CARE_PROVIDER_SITE_OTHER): Payer: Medicare Other | Admitting: Internal Medicine

## 2019-01-02 ENCOUNTER — Other Ambulatory Visit: Payer: Self-pay

## 2019-01-02 VITALS — BP 126/62 | HR 88 | Temp 97.2°F | Resp 16 | Ht 71.0 in | Wt 194.8 lb

## 2019-01-02 DIAGNOSIS — D485 Neoplasm of uncertain behavior of skin: Secondary | ICD-10-CM | POA: Diagnosis not present

## 2019-01-02 DIAGNOSIS — I10 Essential (primary) hypertension: Secondary | ICD-10-CM

## 2019-01-02 NOTE — Progress Notes (Signed)
Subjective:    Patient ID: [name]., male    DOB: 08-23-1940, 78 y.o.   MRN: [id number]  HPI        This very nice 78 y.o. MWM  with HTN, ASCAD/chronic CHF, hx/o ASPVD  HLD, Pre-Diabetes, Gout, COPD/OSA overlap  and Vitamin D Deficiency who presents with a large polypoid pigmented type lesion of his Rt axilla which is irritated & pruritic. He desires to have it removed.       Patient relates his BP's have been in control. With hypertensive systems review negative.    Medication Sig  . acetaminophen  500 MG  Take 1,000 mg every 6 hrs as needed   . ALPRAZolam  1 MG  Take  at bedtime as needed for sleep.  [name] GAVISCON Take 4 tab daily as needed  . aspirin EC 81 MG  Take  daily.  [name] atorvastatin  40 MG  Take daily at 6 PM.  . B Complex Vitamins Take 1 tablet  at bedtime.   . benzonatate200 MG  Take 1 cap 3 times daily as needed  . carvedilol  6.25 MG Take 1 tablet 3 x /day for BP & Heart  . VITAMIN D Take 5,000 Units  daily.   . colchicine 0.6 MG Take  daily as needed   . diphenhydrAMINE  25 MG  Take  at bedtime as needed for allergies.   . ferrous sulfate 325 (65 FE) MG Take 2 times daily with a meal.  . FLONASE nasal spray Place 1 spray into both nostrils daily  . NPH-70-30  47 units AM, 30-35 units PM)  . LIDODERM 5 % Place 1 patch onto the skin daily   . loratadine  10 MG  Take daily as needed for allergies.   . metolazone  5 MG  TAKE 1 TABLET DAILY AS NEEDED   . PRESERVISION AREDS 2 Take 1 capsule  2 (two) times daily.   . ARTIFICIAL TEARS  Place 1 drop into both eyes daily as needed   . potassium chloride  20 MEQ  Take 2 tablets 3 x /day for potassium  . sertraline 100 MG Take 1 tablet Daily for Mood  . torsemide 100 MG TAKE 1&1/2 tabs  /day  . warfarin  5 MG tablet Take 1 to 2 tablets Daily to Prevent Blood Clots  . allopurinol  300 MG tablet Take 1 tablet daily to prevent gout   . ASTELIN 0.1 % nasal spray PLACE 2 SPRAYS INTO NOSTRILS 2 x /day   Past Medical History:   Diagnosis Date  . Adrenal adenoma   . Anemia   . Anxiety   . Arthritis   . Atypical atrial flutter ([place]) 8/15, 10/15   a. DCCV 08/2013. b. s/p RFA 10/2013.  [name] Basal cell carcinoma   . CAD (coronary artery disease)    a. 04/2013 CABG x 2: LIMA to LAD, SVG to RI, EVH via R thigh.  . Cellulitis 12/2015   left leg  . Chronic diastolic congestive heart failure ([place])   . CKD (chronic kidney disease), stage III   . Depression   . Diabetes mellitus type II   . Diverticulosis 2001  . DJD (degenerative joint disease)   . GERD (gastroesophageal reflux disease)   . Gout   . H/O hiatal hernia   . History of cardioversion    x3 (years uncertain)  . Hx of adenomatous colonic polyps   . Hyperlipidemia   . Hypertension   .  Hypertensive cardiomyopathy ([place])   . Obstructive sleep apnea    compliant with CPAP  . 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 Persistent atrial fibrillation ([place])    a. s/p MAZE 04/2013 in setting of CABG. b. Amio stopped in 10/2013 after flutter ablation.  [name] PFO (patent foramen ovale)    a. Small PFO by TEE 10/2013.  [name] Pleural effusion, left    a. s/p thoracentesis 05/2013.  [name] Respiratory failure ([place])    a. Hypoxia 10/2013 - required supp O2 as inpatient, did not require it at discharge.  . S/P Maze operation for atrial fibrillation    a. 04/2013: Complete bilateral atrial lesion set using cryothermy and bipolar radiofrequency ablation with clipping of LA appendage (@ time of CABG)  . S/P Maze operation for atrial fibrillation 04/05/2013   Complete bilateral atrial lesion set using cryothermy and bipolar radiofrequency ablation with clipping of LA appendage via median sternotomy approach    Past Surgical History:  Procedure Laterality Date  . APPENDECTOMY  03/05/2017   laproscopic  . ATRIAL FIBRILLATION ABLATION N/A 10/26/2013   Procedure: ATRIAL FIBRILLATION  ABLATION;  Surgeon: Coralyn Mark, MD;  Location: [place] CATH LAB;  Service: Cardiovascular;  Laterality: N/A;  . BASAL CELL CARCINOMA EXCISION     x3 on face  . CARDIAC CATHETERIZATION     myocardial bridge but no cad  . CARDIOVERSION N/A 08/23/2013   Procedure: CARDIOVERSION;  Surgeon: Sanda Klein, MD;  Location: [place];  Service: Cardiovascular;  Laterality: N/A;  . CARPOMETACARPEL SUSPENSION PLASTY Left 02/14/2014   Procedure: CARPOMETACARPEL ([place]) SUSPENSIONPLASTY THUMB  WITH  ABDUCTOR POLLICIS LONGUS TRANSFER AND STENOSING TENOSYNOVITIS RELEASE LEFT WRIST;  Surgeon: Charlotte Crumb, MD;  Location: [place];  Service: Orthopedics;  Laterality: Left;  . CATARACT EXTRACTION     bilateral  . COLONOSCOPY WITH PROPOFOL N/A 01/26/2017   Procedure: COLONOSCOPY WITH PROPOFOL;  Surgeon: Doran Stabler, MD;  Location: [facility]OS[facility];  Service: Gastroenterology;  Laterality: N/A;  . CORONARY ARTERY BYPASS GRAFT N/A 04/05/2013   Procedure: CORONARY ARTERY BYPASS GRAFTING (CABG) TIMES TWO USING LEFT INTERNAL MAMMARY ARTERY AND RIGHT SAPHENOUS LEG VEIN HARVESTED ENDOSCOPICALLY;  Surgeon: Rexene Alberts, MD;  Location: [place];  Service: Open Heart Surgery;  Laterality: N/A;  . ESOPHAGOGASTRODUODENOSCOPY (EGD) WITH PROPOFOL N/A 01/26/2017   Procedure: ESOPHAGOGASTRODUODENOSCOPY (EGD) WITH PROPOFOL;  Surgeon: Doran Stabler, MD;  Location: [facility];  Service: Gastroenterology;  Laterality: N/A;  . GREAT TOE ARTHRODESIS, INTERPHALANGEAL JOINT     Right foot  . INTRAOPERATIVE TRANSESOPHAGEAL ECHOCARDIOGRAM N/A 04/05/2013   Procedure: INTRAOPERATIVE TRANSESOPHAGEAL ECHOCARDIOGRAM;  Surgeon: Rexene Alberts, MD;  Location: [place];  Service: Open Heart Surgery;  Laterality: N/A;  . LAPAROSCOPIC APPENDECTOMY N/A 03/05/2017   Procedure: APPENDECTOMY LAPAROSCOPIC;  Surgeon: Judeth Horn, MD;  Location: [place];  Service: General;  Laterality: N/A;  . LEFT HEART CATHETERIZATION WITH CORONARY  ANGIOGRAM N/A 03/07/2013   Procedure: LEFT HEART CATHETERIZATION WITH CORONARY ANGIOGRAM;  Surgeon: Burnell Blanks, MD;  Location[facility] CATH LAB;  Service: Cardiovascular;  Laterality: N/A;  . MAZE N/A 04/05/2013   Procedure: MAZE;  Surgeon: Rexene Alberts, MD;  Location: [place];  Service: Open Heart Surgery;  Laterality: N/A;  . Polinydal cyst     Removed  . POLYPECTOMY    . Retina repair-right    . RIGHT HEART CATHETERIZATION N/A 05/03/2014  Procedure: RIGHT HEART CATH;  Surgeon: Jolaine Artist, MD;  Location[facility] C[facility] LAB;  Service: Cardiovascular;  Laterality: N/A;  . TEE WITHOUT CARDIOVERSION N/A 08/23/2013   Procedure: TRANSESOPHAGEAL ECHOCARDIOGRAM (TEE);  Surgeon: Sanda Klein, MD;  Location: [facility] ENDOSCOPY;  Service: Cardiovascular;  Laterality: N/A;  . TEE WITHOUT CARDIOVERSION N/A 10/26/2013   Procedure: TRANSESOPHAGEAL ECHOCARDIOGRAM (TEE);  Surgeon: Sueanne Margarita, MD;  Location: [facility] ENDOSCOPY;  Service: Cardiovascular;  Laterality: N/A;  . TEE WITHOUT CARDIOVERSION N/A 06/05/2014   Procedure: TRANSESOPHAGEAL ECHOCARDIOGRAM (TEE);  Surgeon: Thayer Headings, MD;  Location: [facility] ENDOSCOPY;  Service: Cardiovascular;  Laterality: N/A;  . TRAPEZIUM RESECTION     Review of Systems    10 point systems review negative except as above.    Objective:   Physical Exam  BP 126/62   Pulse 88   Temp (!) 97.2 F (36.2 C)   Resp 16   Ht 5\' 11"  (1.803 m)   Wt 194 lb 12.8 oz (88.4 kg)   BMI 27.17 kg/m   HEENT - WNL. Neck - supple.  Chest - Clear equal BS. Cor - Nl HS. RRR w/o sig MGR. PP 1(+). No edema. MS- FROM w/o deformities.  Gait Nl. Neuro -  Nl w/o focal abnormalities. Skin there is a 15 mm  X 15 mm soft raised brown pigmented lesion of the rt axilla.   Procedure   (CPT - 67619)      After informed consent and aseptic prep with alcohol and local anesthesia the above lesion was sharply excised with a #10 scalpel . Wound edges were undermined,  approximated with # 4 vertical  mattress sutures & # 2 interrupted sutures with Nylon 3-0. Hemostasis was maintained and the wound was sealed with "New skin" and covered with antibiotic ung & band aid. Patient was instructed in po care and advised recheck 1 week to see if sutures can be removed.     Assessment & Plan:   1. Essential hypertension   2. Giant pigmented nevus

## 2019-01-05 ENCOUNTER — Encounter: Payer: Self-pay | Admitting: Internal Medicine

## 2019-01-11 NOTE — Progress Notes (Signed)
   Subjective:    Patient ID: Cory Kettle., male    DOB: 01-26-1940, 79 y.o.   MRN: [id number]  HPI    Patient is a nice 79 yo MWM who returns for 10 day f/u post excision of a  large polypoid pigmented type lesion of his Rt axilla. Patient has had no problems.  Review of Systems   10 point systems review negative except as above.    Objective:   Physical Exam  BP 112/68   Pulse 80   Temp (!) 97 F (36.1 C)   Resp 16   Ht 5\' 11"  (1.803 m)   Wt 192 lb 12.8 oz (87.5 kg)   BMI 26.89 kg/m   Wound Rt axilla appears clean with good healing ridge & no sign of infection - Sutures removed & sterile dressing applied.     Assessment & Plan:   1. Giant pigmented nevus

## 2019-01-12 ENCOUNTER — Other Ambulatory Visit: Payer: Self-pay

## 2019-01-12 ENCOUNTER — Ambulatory Visit: Payer: Medicare Other | Admitting: Internal Medicine

## 2019-01-12 VITALS — BP 112/68 | HR 80 | Temp 97.0°F | Resp 16 | Ht 71.0 in | Wt 192.8 lb

## 2019-01-12 DIAGNOSIS — M1990 Unspecified osteoarthritis, unspecified site: Secondary | ICD-10-CM | POA: Diagnosis not present

## 2019-01-12 DIAGNOSIS — M109 Gout, unspecified: Secondary | ICD-10-CM | POA: Diagnosis not present

## 2019-01-12 DIAGNOSIS — M545 Low back pain: Secondary | ICD-10-CM | POA: Diagnosis not present

## 2019-01-12 DIAGNOSIS — M79643 Pain in unspecified hand: Secondary | ICD-10-CM | POA: Diagnosis not present

## 2019-01-12 DIAGNOSIS — N189 Chronic kidney disease, unspecified: Secondary | ICD-10-CM | POA: Diagnosis not present

## 2019-01-12 DIAGNOSIS — D485 Neoplasm of uncertain behavior of skin: Secondary | ICD-10-CM

## 2019-01-14 ENCOUNTER — Encounter: Payer: Self-pay | Admitting: Internal Medicine

## 2019-01-18 NOTE — Progress Notes (Signed)
MEDICARE ANNUAL WELLNESS VISIT AND FOLLOW UP Assessment:   Medicare annual wellness visit  PAF (paroxysmal atrial fibrillation) ([place]) Continue follow up cardio, continue coumadin -     CBC with Differential/Platelet  Chronic diastolic congestive heart failure (HCC) Weight stable, continue medications, refill potassium -     potassium chloride SA (K-DUR,KLOR-CON) 20 MEQ tablet; Take 1 tablet (20 mEq total) by mouth 2 (two) times daily.  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 Call if greater than 130/80.  -     CMP/GFR  Coronary artery disease involving native coronary artery of native heart without angina pectoris Control blood pressure, cholesterol, glucose, increase exercise.   PFO (patent foramen ovale) Monitoring with cardio  Pulmonary hypertension (HCC) Monitored by cardio and pulmonary, doing much better  Atrial flutter, unspecified type ([place]) Continue follow up cardio, continue coumadin  Chronic respiratory failure with hypoxia (HCC) Monitored by pulmonary  OSA on CPAP Continue CPAP  Chronic restrictive lung disease Monitored by pulmonary  Diverticulosis of large intestine without hemorrhage  No tenderness, continue fiber  GERD Continue PPI/H2 blocker, diet discussed  Controlled type 2 diabetes mellitus with stage 3 chronic kidney disease, without long-term current use of insulin ([place]) Discussed general issues about diabetes pathophysiology and management., Educational material distributed., Suggested low cholesterol diet., Encouraged aerobic exercise., Discussed foot care.,  Reminded to get yearly retinal exam - report from most recent verified  Osteoarthritis, unspecified osteoarthritis type, unspecified site Monitor  CKD (chronic kidney disease) stage 3, GFR 30-59 ml/min  avoid NSAIDS, monitor sugars, will monitor -     BASIC METABOLIC PANEL WITH GFR  Hyperlipidemia -continue medications, check lipids, decrease fatty foods, increase activity.   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   Gout, unspecified cause, unspecified chronicity, unspecified site Monitor, check uric acid today and forward to [name] per patient request  Iron deficiency/blood loss anemia - check CBC, check iron  Senile purpura R/t age, coumadin; reassured; protect skin; monitor   Over 30 minutes of exam, counseling, chart review, and critical decision making was performed  Future Appointments  Date Time Provider [place]  02/20/2019  1:15 PM [facility] ECHO OP 2 [facility]  02/20/2019  2:00 PM Bensimhon, Shaune Pascal, MD MC-HVSC None  02/22/2019  2:30 PM Vicie Mutters, PA-C GAAM-GAAIM None  03/28/2019  2:30 PM [name], MD GAAM-GAAIM None  04/21/2019 12:30 PM Hayden Pedro, MD TRE-TRE None  10/11/2019 10:00 AM [name], MD GAAM-GAAIM None     Plan:   During the course of the visit the patient was educated and counseled about appropriate screening and preventive services including:     Pneumococcal vaccine   Influenza vaccine  Prevnar 13  Td  vaccine  Screening electrocardiogram  Colorectal cancer screening  Diabetes screening  Glaucoma screening  Nutrition counseling    Subjective:  [name]. is a 79 y.o. male who presents for Medicare Annual Wellness Visit and 1 month follow up for HTN, CKD, weight and pfib coumadin management.   He requests uric acid be drawn today and forwarded to [name].   He has a complicated heart history, had ASCAD and pfib/flutter in Apr 2015 with subsequent CABG with MAZE. 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. Weight is stable.    He has a history of Diastolic CHF (last ECHO 24/4628 LVEF 60-65%) Denies orthopnea, paroxysmal nocturnal dyspnea and edema. Positive for exertional dyspnea (taking out trash cans, etc). Consistent with baseline. He checks weight daily and transmits to insurance. He takes 150 mg torsemide in AM. Has PRN metolazone 5 mg but hasn't needed in several months.  Wt Readings from Last 3 Encounters:  01/19/19 192 lb (87.1 kg)  01/12/19 192 lb 12.8 oz (87.5 kg)  01/02/19 194 lb 12.8 oz (88.4 kg)   He is on xanax as needed for sleep and zoloft for depression/anxiety which is in remission.   BMI is Body mass index is 26.78 kg/m., he has been working on diet; admits exercise is limited with current cold weather, avoiding gym with current pandemic.  Wt Readings from Last 3 Encounters:  01/19/19 192 lb (87.1 kg)  01/12/19 192 lb 12.8 oz (87.5 kg)  01/02/19 194 lb 12.8 oz (88.4 kg)   His blood pressure has been controlled at home, today their BP is BP: 112/62 He does workout, walks and works out in the yard. He denies chest pain, shortness of breath, dizziness.   He is still on coumadin at this time, denies any bleeding, black stools, etc.  He is on 10 mg daily previously prior  to holding for elevated INR, was instructed to resume 5 mg daily on 12/26/2018:  Lab Results  Component Value Date   INR 1.1 12/26/2018   INR 4.5 (H) 12/19/2018   INR 2.6 (H) 11/15/2018   Last GFR Lab Results  Component Value Date   GFRNONAA 39 (L) 12/19/2018  Follows with nephrology   Lab Results  Component Value Date   WBC 9.8 12/19/2018   HGB 11.3 (L) 12/19/2018   HCT 33.6 (L) 12/19/2018   MCV 96.6 12/19/2018   PLT 236 12/19/2018     Medication Review: Current Outpatient Medications on File Prior to Visit  Medication Sig  . acetaminophen (TYLENOL) 500 MG tablet Take 1,000 mg by mouth every 6 (six) hours as needed for moderate pain or headache.   . ALPRAZolam (XANAX) 1 MG tablet Take 0.5 tablets (0.5 mg total) by mouth at bedtime as needed for sleep.  [name] Alum Hydroxide-Mag Carbonate (GAVISCON PO) Take 4 tablets by mouth daily as needed (acid reflux).   [name] aspirin EC 81 MG tablet Take 81 mg by mouth daily.  [name] atorvastatin (LIPITOR) 40 MG tablet Take 0.5 tablets (20 mg total) by mouth daily at 6 PM.  . B Complex Vitamins (VITAMIN B COMPLEX PO) Take 1 tablet by mouth at bedtime.   . bacitracin 500 UNIT/GM ointment Apply 1 application topically daily as needed for wound care.  . Blood Glucose Monitoring Suppl (ACCU-CHEK AVIVA PLUS) w/Device KIT Check blood sugar 1 time  daily  . Blood  Glucose Monitoring Suppl DEVI Test blood sugar up to three times a day or as directed.  . carvedilol (COREG) 6.25 MG tablet Take 1 tablet 3 x /day for BP & Heart  . Cholecalciferol (VITAMIN D PO) Take 5,000 Units by mouth daily.   . colchicine 0.6 MG tablet Take 0.6 mg by mouth daily as needed (for gout flare up).   . diphenhydrAMINE (BENADRYL) 25 MG tablet Take 25 mg by mouth at bedtime as needed for allergies.   . ferrous sulfate 325 (65 FE) MG tablet Take 325 mg by mouth 2 (two) times daily with a meal.  . fluticasone (FLONASE) 50 MCG/ACT nasal spray Place 1 spray into both nostrils daily as  needed for allergies.  [name] glucose blood (ACCU-CHEK AVIVA PLUS) test strip CHECK BLOOD GLUCOSE 3 TIMES DAILY.  [name] insulin NPH-regular Human (70-30) 100 UNIT/ML injection 45 units AM, 30-35 units PM (Patient taking differently: 47 units AM, 30-35 units PM)  . lidocaine (LIDODERM) 5 % Place 1 patch onto the skin daily as needed (for pain). Remove & Discard patch within 12 hours or as directed by MD   . loratadine (CLARITIN) 10 MG tablet Take 10 mg by mouth daily as needed for allergies.   . metolazone (ZAROXOLYN) 5 MG tablet TAKE 1 TABLET BY MOUTH  DAILY AS NEEDED (WEIGHT  GAIN AND EDEMA)  . Multiple Vitamins-Minerals (PRESERVISION AREDS 2) CAPS Take 1 capsule by mouth 2 (two) times daily.   . polyvinyl alcohol (ARTIFICIAL TEARS) 1.4 % ophthalmic solution Place 1 drop into both eyes daily as needed for dry eyes.  . potassium chloride SA (KLOR-CON) 20 MEQ tablet Take 2 tablets 3 x /day for potassium  . sertraline (ZOLOFT) 100 MG tablet Take 1 tablet Daily for Mood  . torsemide (DEMADEX) 100 MG tablet TAKE 1 TABLET BY MOUTH IN  THE MORNING AND 1/2 TABLET  IN THE EVENING IF NEEDED  FOR WEIGHT GAIN (Patient taking differently: Takes 1.5 tablets in the morning)  . warfarin (COUMADIN) 5 MG tablet Take 1 to 2 tablets Daily to Prevent Blood Clots  . allopurinol (ZYLOPRIM) 300 MG tablet Take 1 tablet daily to prevent gout (Patient taking differently: Take 300 mg by mouth daily. )  . azelastine (ASTELIN) 0.1 % nasal spray PLACE 2 SPRAYS INTO BOTH NOSTRILS 2 (TWO) TIMES DAILY. USE IN EACH NOSTRIL AS DIRECTED (Patient taking differently: Place 2 sprays into both nostrils as needed. Use in each nostril as directed)  . benzonatate (TESSALON) 200 MG capsule Take 1 capsule (200 mg total) by mouth 3 (three) times daily as needed for cough (Max: 657m per day). (Patient not taking: Reported on 01/19/2019)   No current facility-administered medications on file prior to visit.    Allergies: Allergies  Allergen Reactions   . Sunflower Seed [Sunflower Oil] Swelling and Other (See Comments)    Tongue and lip swelling  . Horse-Derived Products Other (See Comments)    Per allergy skin test UNSPECIFIED REACTION   . Tetanus Toxoids Other (See Comments)    Per allergy skin test  . 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; Type 2 diabetes mellitus with stage 4 chronic kidney disease, with long-term current use of insulin (  [place]); GERD; DJD (degenerative joint disease); Pleural effusion on left; Vitamin D deficiency; Encounter for monitoring Coumadin therapy; Chronic anticoagulation; Essential hypertension; CAD (coronary artery disease); PFO (patent foramen ovale); 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[place]); OSA on CPAP; S/P Maze operation for atrial fibrillation; Pulmonary hypertension ([place]); 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  Screening Tests Immunization History  Administered Date(s) Administered  . DT (Pediatric) 08/05/2015  . Influenza Split 10/25/2012  . Influenza, High Dose Seasonal PF 09/26/2013, 09/27/2014, 10/03/2015, 10/05/2016, 11/11/2017, 10/10/2018  . Pneumococcal Conjugate-13 01/30/2014  . Pneumococcal Polysaccharide-23 10/20/2011  . Td 01/06/2000   Preventative care: Last colonoscopy: 01/26/2017,  3 year recall?  EGD: 01/26/2017  Prior vaccinations: TD or Tdap: 2002 Influenza:  10/2018 Pneumococcal: 2013 Prevnar13: 2016 Shingles/Zostavax: undecided  Names of Other Physician/Practitioners you currently use: 1. [facility] here for primary care 2. Dr. Zigmund Daniel, eye doctor, last visit 09/30/2018 - report received and abstracted - retinopathy  3. Dr. Johnn Hai, dentist, last visit 2020, goes q62m Patient Care Team: MUnk Pinto MD as PCP - General (Internal Medicine) KInda Castle MD (Inactive) as Consulting Physician (Gastroenterology) JMartinique Peter M, MD as Consulting Physician (Cardiology) ORexene Alberts MD as Consulting Physician (Cardiothoracic Surgery) Szott, MPrescott Gum DDS as Referring Physician MHayden Pedro MD as Consulting Physician (Ophthalmology) TLavonna Monarch MD as Consulting Physician (Dermatology) NJosue Hector MD as Consulting Physician (Cardiology) AThompson Grayer MD as Consulting Physician (Cardiology) Pleasant, FEppie Gibson RN as TFarmersburgManagement BRosita Fire MD as Consulting Physician (Nephrology)  Surgical: He  has a past surgical history that includes Polinydal cyst; Great toe arthrodesis, interphalangeal joint; Retina repair-right; Cataract extraction; Excision basal cell carcinoma; Polypectomy; Cardiac catheterization; Coronary artery bypass graft (N/A, 04/05/2013); MAZE (N/A, 04/05/2013); Intraoprative transesophageal echocardiogram (N/A, 04/05/2013); TEE without cardioversion (N/A, 08/23/2013); Cardioversion (N/A, 08/23/2013); TEE without cardioversion (N/A, 10/26/2013); left heart catheterization with coronary angiogram (N/A, 03/07/2013); atrial fibrillation ablation (N/A, 10/26/2013); Carpometacarpel ([facility] suspension plasty (Left, 02/14/2014); Trapezium resection; right heart catheterization (N/A, 05/03/2014); TEE without cardioversion (N/A, 06/05/2014); Esophagogastroduodenoscopy (egd) with propofol (N/A, 01/26/2017); Colonoscopy with propofol (N/A, 01/26/2017);  Appendectomy (03/05/2017); and laparoscopic appendectomy (N/A, 03/05/2017). Family His family history includes Atrial fibrillation in his mother; Colon cancer in his mother; Colon polyps in his mother and sister; Dementia in his father; Diabetes in his maternal uncle; Hypertension in his mother; Stroke in his paternal uncle. Social history  He reports that he quit smoking about 38 years ago. His smoking use included cigarettes. He has a 100.00 pack-year smoking history. He has never used smokeless tobacco. He reports current alcohol use of about 1.0 standard drinks of alcohol per week. He reports that he does not use drugs.  MEDICARE WELLNESS OBJECTIVES: Physical activity: Current Exercise Habits: Home exercise routine, Type of exercise: walking, Time (Minutes): 10, Frequency (Times/Week): 7, Weekly Exercise (Minutes/Week): 70, Intensity: Mild, Exercise limited by: cardiac condition(s) Cardiac risk factors: Cardiac Risk Factors include: advanced age (>564m, >6>37omen);male gender;hypertension;dyslipidemia;sedentary lifestyle;diabetes mellitus;smoking/ tobacco exposure Depression/mood screen:   [facility] [address] 01/19/2019  Decreased Interest 0  Down, Depressed, Hopeless 0  PHQ - 2 Score 0  Some recent data might be hidden    ADLs:  In your present state of health, do you have  any difficulty performing the following activities: 01/19/2019 12/18/2018  Hearing? N N  Vision? N N  Difficulty concentrating or making decisions? N N  Walking or climbing stairs? N N  Dressing or bathing? N N  Doing errands, shopping? N N  Some recent data might be hidden     Cognitive Testing  Alert? Yes  Normal Appearance?Yes  Oriented to person? Yes  Place? Yes   Time? Yes  Recall of three objects?  Yes  Can perform simple calculations? Yes  Displays appropriate judgment?Yes  Can read the correct time from a watch face?Yes  EOL planning: Does Patient Have a Medical Advance Directive?: Yes Type of  Advance Directive: Healthcare Power of Attorney, Living will Does patient want to make changes to medical advance directive?: No - Patient declined Copy of [place] in Chart?: No - copy requested  Review of Systems  Constitutional: Negative for chills, fever, malaise/fatigue and weight loss.  HENT: Negative.  Negative for hearing loss and tinnitus.   Eyes: Negative for blurred vision and double vision.  Respiratory: Negative for cough, sputum production, shortness of breath (exertional with cold weather or weight bearing exertion, consistent with baseline ) and wheezing.   Cardiovascular: Negative for chest pain, palpitations, orthopnea, claudication, leg swelling and PND.  Gastrointestinal: Negative.  Negative for abdominal pain, blood in stool, constipation, diarrhea, heartburn, melena, nausea and vomiting.  Genitourinary: Negative.  Negative for dysuria, flank pain, frequency, hematuria and urgency.  Musculoskeletal: Negative.  Negative for falls, joint pain and myalgias.  Skin: Negative.  Negative for rash.  Neurological: Negative for dizziness, tingling, tremors, sensory change, speech change, focal weakness, seizures, loss of consciousness, weakness and headaches.  Endo/Heme/Allergies: Negative for polydipsia.  Psychiatric/Behavioral: Negative.  Negative for depression, memory loss, substance abuse and suicidal ideas. The patient is not nervous/anxious and does not have insomnia.   All other systems reviewed and are negative.    Objective:   Today's Vitals   01/19/19 1430  BP: 112/62  Pulse: (!) 55  Temp: 97.9 F (36.6 C)  SpO2: 96%  Weight: 192 lb (87.1 kg)   Body mass index is 26.78 kg/m.  General appearance: alert, no distress, WD/WN, male HEENT: normocephalic, sclerae anicteric, TMs pearly, nares patent, no discharge or erythema, pharynx normal Oral cavity: mask in place; deferred  Neck: supple, no lymphadenopathy, no thyromegaly, no masses Heart:  RRR, normal S1, S2, no murmurs Lungs: CTA bilaterally, no wheezes, rhonchi, or rales Abdomen: +bs, soft, non tender, non distended, no masses, no hepatomegaly, no splenomegaly Musculoskeletal: nontender, no swelling, no obvious deformity Extremities: no edema, no cyanosis, no clubbing Pulses: 2+ symmetric, upper and lower extremities, normal cap refill Skin: warm/dry, intact; no rashes, concerning lesions; he has scattered small ecchymoses to bil upper extremities Neurological: alert, oriented x 3 strength normal upper extremities and lower extremities, sensation normal throughout, DTRs 2+ throughout, no cerebellar signs, gait normal Psychiatric: normal affect, behavior normal, pleasant   Medicare Attestation I have personally reviewed: The patient's medical and social history Their use of alcohol, tobacco or illicit drugs Their current medications and supplements The patient's functional ability including ADLs,fall risks, home safety risks, cognitive, and hearing and visual impairment Diet and physical activities Evidence for depression or mood disorders  The patient's weight, height, BMI, and visual acuity have been recorded in the chart.  I have made referrals, counseling, and provided education to the patient based on review of the above and I have provided the patient with a written  personalized care plan for preventive services.     Izora Ribas, NP   01/19/2019

## 2019-01-19 ENCOUNTER — Encounter: Payer: Self-pay | Admitting: Adult Health

## 2019-01-19 ENCOUNTER — Other Ambulatory Visit: Payer: Self-pay

## 2019-01-19 ENCOUNTER — Ambulatory Visit (INDEPENDENT_AMBULATORY_CARE_PROVIDER_SITE_OTHER): Payer: Medicare Other | Admitting: Adult Health

## 2019-01-19 VITALS — BP 112/62 | HR 55 | Temp 97.9°F | Wt 192.0 lb

## 2019-01-19 DIAGNOSIS — E1122 Type 2 diabetes mellitus with diabetic chronic kidney disease: Secondary | ICD-10-CM

## 2019-01-19 DIAGNOSIS — N183 Chronic kidney disease, stage 3 unspecified: Secondary | ICD-10-CM | POA: Diagnosis not present

## 2019-01-19 DIAGNOSIS — R6889 Other general symptoms and signs: Secondary | ICD-10-CM

## 2019-01-19 DIAGNOSIS — Z9989 Dependence on other enabling machines and devices: Secondary | ICD-10-CM

## 2019-01-19 DIAGNOSIS — I4892 Unspecified atrial flutter: Secondary | ICD-10-CM

## 2019-01-19 DIAGNOSIS — Q2112 Patent foramen ovale: Secondary | ICD-10-CM

## 2019-01-19 DIAGNOSIS — K2100 Gastro-esophageal reflux disease with esophagitis, without bleeding: Secondary | ICD-10-CM

## 2019-01-19 DIAGNOSIS — K5730 Diverticulosis of large intestine without perforation or abscess without bleeding: Secondary | ICD-10-CM

## 2019-01-19 DIAGNOSIS — Z5181 Encounter for therapeutic drug level monitoring: Secondary | ICD-10-CM

## 2019-01-19 DIAGNOSIS — Z860100 Personal history of colon polyps, unspecified: Secondary | ICD-10-CM

## 2019-01-19 DIAGNOSIS — G4733 Obstructive sleep apnea (adult) (pediatric): Secondary | ICD-10-CM

## 2019-01-19 DIAGNOSIS — Z0001 Encounter for general adult medical examination with abnormal findings: Secondary | ICD-10-CM | POA: Diagnosis not present

## 2019-01-19 DIAGNOSIS — Z6826 Body mass index (BMI) 26.0-26.9, adult: Secondary | ICD-10-CM

## 2019-01-19 DIAGNOSIS — I272 Pulmonary hypertension, unspecified: Secondary | ICD-10-CM

## 2019-01-19 DIAGNOSIS — I480 Paroxysmal atrial fibrillation: Secondary | ICD-10-CM | POA: Diagnosis not present

## 2019-01-19 DIAGNOSIS — Z9889 Other specified postprocedural states: Secondary | ICD-10-CM

## 2019-01-19 DIAGNOSIS — M1990 Unspecified osteoarthritis, unspecified site: Secondary | ICD-10-CM

## 2019-01-19 DIAGNOSIS — Z8679 Personal history of other diseases of the circulatory system: Secondary | ICD-10-CM

## 2019-01-19 DIAGNOSIS — D509 Iron deficiency anemia, unspecified: Secondary | ICD-10-CM | POA: Diagnosis not present

## 2019-01-19 DIAGNOSIS — D692 Other nonthrombocytopenic purpura: Secondary | ICD-10-CM

## 2019-01-19 DIAGNOSIS — Z951 Presence of aortocoronary bypass graft: Secondary | ICD-10-CM

## 2019-01-19 DIAGNOSIS — Z7901 Long term (current) use of anticoagulants: Secondary | ICD-10-CM

## 2019-01-19 DIAGNOSIS — E1169 Type 2 diabetes mellitus with other specified complication: Secondary | ICD-10-CM

## 2019-01-19 DIAGNOSIS — I110 Hypertensive heart disease with heart failure: Secondary | ICD-10-CM | POA: Diagnosis not present

## 2019-01-19 DIAGNOSIS — J984 Other disorders of lung: Secondary | ICD-10-CM

## 2019-01-19 DIAGNOSIS — N2581 Secondary hyperparathyroidism of renal origin: Secondary | ICD-10-CM

## 2019-01-19 DIAGNOSIS — Q211 Atrial septal defect: Secondary | ICD-10-CM

## 2019-01-19 DIAGNOSIS — I10 Essential (primary) hypertension: Secondary | ICD-10-CM

## 2019-01-19 DIAGNOSIS — I5032 Chronic diastolic (congestive) heart failure: Secondary | ICD-10-CM | POA: Diagnosis not present

## 2019-01-19 DIAGNOSIS — M1A9XX0 Chronic gout, unspecified, without tophus (tophi): Secondary | ICD-10-CM

## 2019-01-19 DIAGNOSIS — E559 Vitamin D deficiency, unspecified: Secondary | ICD-10-CM

## 2019-01-19 DIAGNOSIS — N184 Chronic kidney disease, stage 4 (severe): Secondary | ICD-10-CM | POA: Diagnosis not present

## 2019-01-19 DIAGNOSIS — Z0000 Encounter for general adult medical examination without abnormal findings: Secondary | ICD-10-CM

## 2019-01-19 DIAGNOSIS — F3342 Major depressive disorder, recurrent, in full remission: Secondary | ICD-10-CM

## 2019-01-19 NOTE — Patient Instructions (Addendum)
  [name] , Thank you for taking time to come for your Medicare Wellness Visit. I appreciate your ongoing commitment to your health goals. Please review the following plan we discussed and let me know if I can assist you in the future.   These are the goals we discussed: Goals    . Blood Pressure < 140/90    . HEMOGLOBIN A1C < 8.0    . LDL CALC < 70    . Record weight daily     Call if trending up       This is a list of the screening recommended for you and due dates:  Health Maintenance  Topic Date Due  . Hemoglobin A1C  05/15/2019  . Complete foot exam   09/08/2019  . Urine Protein Check  09/08/2019  . Eye exam for diabetics  09/30/2019  . Tetanus Vaccine  07/05/2025  . Flu Shot  Completed  . Pneumonia vaccines  Completed    Try calling insurance about insurance formulary    Try calling [place] about covid 19 vaccine - [phone]    Individuals seeking information about the vaccines and state's phased distribution plan can learn more by going to - [email or web address]  -[email or web address]   And continue to monitor the county's website and press releases.   And you can call the [place] starting on Jan 8th at 8 AM to schedule for a vaccine. [phone].

## 2019-01-20 ENCOUNTER — Other Ambulatory Visit: Payer: Self-pay | Admitting: Adult Health

## 2019-01-20 DIAGNOSIS — I480 Paroxysmal atrial fibrillation: Secondary | ICD-10-CM

## 2019-01-20 LAB — CBC WITH DIFFERENTIAL/PLATELET
Absolute Monocytes: 629 cells/uL (ref 200–950)
Basophils Absolute: 60 cells/uL (ref 0–200)
Basophils Relative: 0.7 %
Eosinophils Absolute: 119 cells/uL (ref 15–500)
Eosinophils Relative: 1.4 %
HCT: 37.4 % — ABNORMAL LOW (ref 38.5–50.0)
Hemoglobin: 12.3 g/dL — ABNORMAL LOW (ref 13.2–17.1)
Lymphs Abs: 893 cells/uL (ref 850–3900)
MCH: 31.5 pg (ref 27.0–33.0)
MCHC: 32.9 g/dL (ref 32.0–36.0)
MCV: 95.9 fL (ref 80.0–100.0)
MPV: 9.2 fL (ref 7.5–12.5)
Monocytes Relative: 7.4 %
Neutro Abs: 6800 cells/uL (ref 1500–7800)
Neutrophils Relative %: 80 %
Platelets: 256 10*3/uL (ref 140–400)
RBC: 3.9 10*6/uL — ABNORMAL LOW (ref 4.20–5.80)
RDW: 13.4 % (ref 11.0–15.0)
Total Lymphocyte: 10.5 %
WBC: 8.5 10*3/uL (ref 3.8–10.8)

## 2019-01-20 LAB — BASIC METABOLIC PANEL WITH GFR
BUN/Creatinine Ratio: 18 (calc) (ref 6–22)
BUN: 33 mg/dL — ABNORMAL HIGH (ref 7–25)
CO2: 31 mmol/L (ref 20–32)
Calcium: 9.5 mg/dL (ref 8.6–10.3)
Chloride: 104 mmol/L (ref 98–110)
Creat: 1.86 mg/dL — ABNORMAL HIGH (ref 0.70–1.18)
GFR, Est African American: 39 mL/min/{1.73_m2} — ABNORMAL LOW (ref >=60)
GFR, Est Non African American: 34 mL/min/{1.73_m2} — ABNORMAL LOW (ref >=60)
Glucose, Bld: 96 mg/dL (ref 65–99)
Potassium: 3.9 mmol/L (ref 3.5–5.3)
Sodium: 144 mmol/L (ref 135–146)

## 2019-01-20 LAB — PROTIME-INR
INR: 1.2 — ABNORMAL HIGH
Prothrombin Time: 12.9 s — ABNORMAL HIGH (ref 9.0–11.5)

## 2019-01-20 LAB — IRON,TIBC AND FERRITIN PANEL
%SAT: 32 % (calc) (ref 20–48)
Ferritin: 106 ng/mL (ref 24–380)
Iron: 124 ug/dL (ref 50–180)
TIBC: 390 mcg/dL (calc) (ref 250–425)

## 2019-01-20 LAB — URIC ACID: Uric Acid, Serum: 5.3 mg/dL (ref 4.0–8.0)

## 2019-01-23 ENCOUNTER — Other Ambulatory Visit: Payer: Self-pay | Admitting: Internal Medicine

## 2019-01-23 ENCOUNTER — Other Ambulatory Visit: Payer: Self-pay | Admitting: *Deleted

## 2019-01-23 MED ORDER — ATORVASTATIN CALCIUM 20 MG PO TABS: ORAL_TABLET | ORAL | 3 refills | Status: DC

## 2019-01-26 DIAGNOSIS — N184 Chronic kidney disease, stage 4 (severe): Secondary | ICD-10-CM | POA: Diagnosis not present

## 2019-02-02 ENCOUNTER — Other Ambulatory Visit: Payer: Self-pay

## 2019-02-02 ENCOUNTER — Ambulatory Visit (INDEPENDENT_AMBULATORY_CARE_PROVIDER_SITE_OTHER): Payer: Medicare Other | Admitting: *Deleted

## 2019-02-02 VITALS — BP 122/68 | HR 64 | Temp 97.2°F | Resp 16 | Wt 197.6 lb

## 2019-02-02 DIAGNOSIS — I480 Paroxysmal atrial fibrillation: Secondary | ICD-10-CM | POA: Diagnosis not present

## 2019-02-02 NOTE — Addendum Note (Signed)
Addended by: Eulis Canner on: 02/02/2019 02:11 PM   Modules accepted: Orders

## 2019-02-02 NOTE — Progress Notes (Signed)
Patient is here for a [place] to check his PT-INR. He is taking Warfarin 10 mg 4 days a week and 5 mg 3 days a week. He has no complaints.

## 2019-02-03 DIAGNOSIS — N184 Chronic kidney disease, stage 4 (severe): Secondary | ICD-10-CM | POA: Diagnosis not present

## 2019-02-03 DIAGNOSIS — E876 Hypokalemia: Secondary | ICD-10-CM | POA: Diagnosis not present

## 2019-02-03 DIAGNOSIS — I129 Hypertensive chronic kidney disease with stage 1 through stage 4 chronic kidney disease, or unspecified chronic kidney disease: Secondary | ICD-10-CM | POA: Diagnosis not present

## 2019-02-03 DIAGNOSIS — D631 Anemia in chronic kidney disease: Secondary | ICD-10-CM | POA: Diagnosis not present

## 2019-02-03 DIAGNOSIS — N2581 Secondary hyperparathyroidism of renal origin: Secondary | ICD-10-CM | POA: Diagnosis not present

## 2019-02-03 LAB — PROTIME-INR
INR: 1.5 — ABNORMAL HIGH
Prothrombin Time: 15.7 s — ABNORMAL HIGH (ref 9.0–11.5)

## 2019-02-06 ENCOUNTER — Other Ambulatory Visit (HOSPITAL_COMMUNITY): Payer: Self-pay

## 2019-02-06 MED ORDER — SERTRALINE HCL 100 MG PO TABS: ORAL_TABLET | ORAL | 3 refills | Status: DC

## 2019-02-13 ENCOUNTER — Telehealth: Payer: Self-pay

## 2019-02-13 DIAGNOSIS — E1122 Type 2 diabetes mellitus with diabetic chronic kidney disease: Secondary | ICD-10-CM

## 2019-02-13 MED ORDER — FREESTYLE LIBRE 14 DAY READER DEVI: 1.0000 | Freq: Three times a day (TID) | 0 refills | Status: DC

## 2019-02-13 MED ORDER — FREESTYLE LIBRE 14 DAY SENSOR MISC: 12 refills | Status: DC

## 2019-02-13 NOTE — Telephone Encounter (Signed)
Prescription request for [facility].

## 2019-02-20 ENCOUNTER — Ambulatory Visit (HOSPITAL_COMMUNITY)
Admission: RE | Admit: 2019-02-20 | Discharge: 2019-02-20 | Disposition: A | Discharge status: Home / Self Care | Payer: Medicare Other | Source: Ambulatory Visit | Attending: Internal Medicine | Admitting: Internal Medicine

## 2019-02-20 ENCOUNTER — Other Ambulatory Visit: Payer: Self-pay

## 2019-02-20 ENCOUNTER — Ambulatory Visit (HOSPITAL_BASED_OUTPATIENT_CLINIC_OR_DEPARTMENT_OTHER)
Admission: RE | Admit: 2019-02-20 | Discharge: 2019-02-20 | Disposition: A | Discharge status: Home / Self Care | Payer: Medicare Other | Source: Ambulatory Visit | Attending: Internal Medicine | Admitting: Internal Medicine

## 2019-02-20 ENCOUNTER — Encounter (HOSPITAL_COMMUNITY): Payer: Self-pay | Admitting: Internal Medicine

## 2019-02-20 VITALS — BP 110/70 | HR 81 | Wt 189.6 lb

## 2019-02-20 DIAGNOSIS — E785 Hyperlipidemia, unspecified: Secondary | ICD-10-CM | POA: Diagnosis not present

## 2019-02-20 DIAGNOSIS — I4510 Unspecified right bundle-branch block: Secondary | ICD-10-CM | POA: Insufficient documentation

## 2019-02-20 DIAGNOSIS — Z79899 Other long term (current) drug therapy: Secondary | ICD-10-CM | POA: Insufficient documentation

## 2019-02-20 DIAGNOSIS — Z951 Presence of aortocoronary bypass graft: Secondary | ICD-10-CM | POA: Diagnosis not present

## 2019-02-20 DIAGNOSIS — D649 Anemia, unspecified: Secondary | ICD-10-CM | POA: Diagnosis not present

## 2019-02-20 DIAGNOSIS — I480 Paroxysmal atrial fibrillation: Secondary | ICD-10-CM | POA: Insufficient documentation

## 2019-02-20 DIAGNOSIS — Z7901 Long term (current) use of anticoagulants: Secondary | ICD-10-CM | POA: Diagnosis not present

## 2019-02-20 DIAGNOSIS — I272 Pulmonary hypertension, unspecified: Secondary | ICD-10-CM

## 2019-02-20 DIAGNOSIS — M109 Gout, unspecified: Secondary | ICD-10-CM | POA: Insufficient documentation

## 2019-02-20 DIAGNOSIS — Z7982 Long term (current) use of aspirin: Secondary | ICD-10-CM | POA: Diagnosis not present

## 2019-02-20 DIAGNOSIS — Z8774 Personal history of (corrected) congenital malformations of heart and circulatory system: Secondary | ICD-10-CM | POA: Insufficient documentation

## 2019-02-20 DIAGNOSIS — E1122 Type 2 diabetes mellitus with diabetic chronic kidney disease: Secondary | ICD-10-CM | POA: Insufficient documentation

## 2019-02-20 DIAGNOSIS — F419 Anxiety disorder, unspecified: Secondary | ICD-10-CM | POA: Insufficient documentation

## 2019-02-20 DIAGNOSIS — I2510 Atherosclerotic heart disease of native coronary artery without angina pectoris: Secondary | ICD-10-CM | POA: Diagnosis not present

## 2019-02-20 DIAGNOSIS — K219 Gastro-esophageal reflux disease without esophagitis: Secondary | ICD-10-CM | POA: Insufficient documentation

## 2019-02-20 DIAGNOSIS — M1990 Unspecified osteoarthritis, unspecified site: Secondary | ICD-10-CM | POA: Insufficient documentation

## 2019-02-20 DIAGNOSIS — Z86010 Personal history of colonic polyps: Secondary | ICD-10-CM | POA: Insufficient documentation

## 2019-02-20 DIAGNOSIS — I429 Cardiomyopathy, unspecified: Secondary | ICD-10-CM | POA: Insufficient documentation

## 2019-02-20 DIAGNOSIS — F329 Major depressive disorder, single episode, unspecified: Secondary | ICD-10-CM | POA: Insufficient documentation

## 2019-02-20 DIAGNOSIS — Z8249 Family history of ischemic heart disease and other diseases of the circulatory system: Secondary | ICD-10-CM | POA: Insufficient documentation

## 2019-02-20 DIAGNOSIS — Z794 Long term (current) use of insulin: Secondary | ICD-10-CM | POA: Insufficient documentation

## 2019-02-20 DIAGNOSIS — G4733 Obstructive sleep apnea (adult) (pediatric): Secondary | ICD-10-CM | POA: Insufficient documentation

## 2019-02-20 DIAGNOSIS — N1832 Chronic kidney disease, stage 3b: Secondary | ICD-10-CM | POA: Diagnosis not present

## 2019-02-20 DIAGNOSIS — Z85828 Personal history of other malignant neoplasm of skin: Secondary | ICD-10-CM | POA: Insufficient documentation

## 2019-02-20 DIAGNOSIS — J449 Chronic obstructive pulmonary disease, unspecified: Secondary | ICD-10-CM | POA: Diagnosis not present

## 2019-02-20 DIAGNOSIS — I130 Hypertensive heart and chronic kidney disease with heart failure and stage 1 through stage 4 chronic kidney disease, or unspecified chronic kidney disease: Secondary | ICD-10-CM | POA: Diagnosis not present

## 2019-02-20 DIAGNOSIS — I5032 Chronic diastolic (congestive) heart failure: Secondary | ICD-10-CM

## 2019-02-20 DIAGNOSIS — N184 Chronic kidney disease, stage 4 (severe): Secondary | ICD-10-CM | POA: Diagnosis not present

## 2019-02-20 DIAGNOSIS — Z87891 Personal history of nicotine dependence: Secondary | ICD-10-CM | POA: Insufficient documentation

## 2019-02-20 LAB — BASIC METABOLIC PANEL
Anion gap: 12 (ref 5–15)
BUN: 37 mg/dL — ABNORMAL HIGH (ref 8–23)
CO2: 28 mmol/L (ref 22–32)
Calcium: 9.2 mg/dL (ref 8.9–10.3)
Chloride: 101 mmol/L (ref 98–111)
Creatinine, Ser: 1.74 mg/dL — ABNORMAL HIGH (ref 0.61–1.24)
GFR calc Af Amer: 42 mL/min — ABNORMAL LOW (ref >60)
GFR calc non Af Amer: 36 mL/min — ABNORMAL LOW (ref >60)
Glucose, Bld: 98 mg/dL (ref 70–99)
Potassium: 3.6 mmol/L (ref 3.5–5.1)
Sodium: 141 mmol/L (ref 135–145)

## 2019-02-20 LAB — BRAIN NATRIURETIC PEPTIDE: B Natriuretic Peptide: 355.7 pg/mL — ABNORMAL HIGH (ref 0.0–100.0)

## 2019-02-20 MED ORDER — METOLAZONE 2.5 MG PO TABS: 2.5000 mg | ORAL_TABLET | Freq: Every day | ORAL | 3 refills | Status: DC | PRN

## 2019-02-20 MED ORDER — TORSEMIDE 100 MG PO TABS: ORAL_TABLET | ORAL | 3 refills | Status: DC

## 2019-02-20 NOTE — Progress Notes (Signed)
Patient ID: [name]., male   DOB: 1940/06/08, 79 y.o.   MRN: [id number]   Advanced Heart Failure Clinic Note   Patient ID: [name]., male   DOB: 14-Jun-1940, 79 y.o.   MRN: [id number]  Primary Cardiologist: [name]  Nephrologist: Carolin Sicks  Subjective: Coron is a 79 y/o male with COPD , DM, PAF, 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, CKD, AFL s/p ablation in 10/15. 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 Had surgery in 4/15. Did well for a while went to cardiac rehab and was feeling fine.  Developed AFL in 10/15 and underwent RFA.   In 3/16 began to develop severe SOB. Started O2. Says his symptoms got worse almost overnight. 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 He has seen Dr. Roxy Manns but felt to have no optimal surgical options for repair. His case was also presented to Dr. Michaelle Birks at [facility] who agreed that there was no way to baffle or reroute the anomalous PV flow to the LA. He had a TEE which showed LVEF 60-65% with a dilated right side and a small PFO. He has also been seen by Dr. Lake Bells who performed PFTs that showed significant restrictive lung disease with a low DLCO. He had f/u with Dr. Gilles Chiquito in the [facility] who felt his symptoms were multifactorial.  Had colonoscopy in 2019 and found to have a polyp and what sounds like AVMs. Subsequently had a laparoscopic removal of appendiceal adenoma which was benign.   Here for routine f/u. Says his DOE is slowly getting worse.Denies edema. Can still see the veins in the top of his feet. Weight stable. Says he has not been as active due to [place] and he thinks that may be playing a role. No CP. Taking torsemide 166m daily   Echo today (02/20/19) LVEF 60-65% RV markedly dilated. Mod HK. RVSP 60.7 mmHg. Personally reviewed  Echo 12/18 LVEF 60% RV dilated mildly HK  Echo 12/17 LVEF 55-60% RV  moderately dilated mild HK. RVSP 546mG   PFTs (7/16) FEV1 1.45 L (45%) FVC 1.77 L (40%) DLCO 46%  RHC 4/16 RA = 18 RV = 72/4/17 PA = 76/27 (46) PCW = 21 Fick cardiac output/index (using PA sat) = 9.2/4.45 Thermo CO/CI = 10.0/4.87 PVR = 2.2 WU Fick cardiac output/index (using high SVC sat) = 5.2/2.5 Pulse-ox saturation = 89%  High SVC sat = 54% Low SVC sat = 81% (at SVC/RA junction) RA sat = 68% RV sat = 66% PA sat = 68%, 69% IVC sat =56%   ECGO 1/17: EF 60%. RV dilated with moderately reduced systolic function. RVSP 70. + bubble.  VQ/CT negative for PE   TEE 10/15 small PFO  Ab u/s 6/16 liver normal + ascites. Medico renal kidney disease.  Past Medical History:  Diagnosis Date  . Adrenal adenoma   . Anemia   . Anxiety   . Arthritis   . [place], [place]15   a. DCCV 08/2013. b. s/p RFA 10/2013.  . Marland Kitchenasal cell carcinoma   . CAD (coronary artery disease)    a. 04/2013 CABG x 2: LIMA to LAD, SVG to RI, EVH via R thigh.  . Cellulitis 12/2015   left leg  . Chronic diastolic congestive heart failure ([place]  . CKD (chronic kidney disease), stage III   .  Depression   . Diabetes mellitus type II   . Diverticulosis 2001  . DJD (degenerative joint disease)   . GERD (gastroesophageal reflux disease)   . Gout   . H/O hiatal hernia   . History of cardioversion    x3 (years uncertain)  . Hx of adenomatous colonic polyps   . Hyperlipidemia   . Hypertension   . Hypertensive cardiomyopathy ([place])   . Obstructive sleep apnea    compliant with CPAP  . 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 Persistent atrial fibrillation ([place])    a. s/p MAZE 04/2013 in setting of CABG. b. Amio stopped in 10/2013 after flutter ablation.  [name] PFO (patent foramen ovale)    a. Small PFO by TEE 10/2013.  [name] Pleural effusion, left    a. s/p thoracentesis 05/2013.  [name]  Respiratory failure ([place])    a. Hypoxia 10/2013 - required supp O2 as inpatient, did not require it at discharge.  . S/P Maze operation for atrial fibrillation    a. 04/2013: Complete bilateral atrial lesion set using cryothermy and bipolar radiofrequency ablation with clipping of LA appendage (@ time of CABG)  . S/P Maze operation for atrial fibrillation 04/05/2013   Complete bilateral atrial lesion set using cryothermy and bipolar radiofrequency ablation with clipping of LA appendage via median sternotomy approach     Current Outpatient Medications  Medication Sig Dispense Refill  . acetaminophen (TYLENOL) 500 MG tablet Take 1,000 mg by mouth every 6 (six) hours as needed for moderate pain or headache.     . allopurinol (ZYLOPRIM) 300 MG tablet Take 300 mg by mouth daily.    [name] ALPRAZolam (XANAX) 1 MG tablet Take 0.5 tablets (0.5 mg total) by mouth at bedtime as needed for sleep. 60 tablet 0  . Alum Hydroxide-Mag Carbonate (GAVISCON PO) Take 4 tablets by mouth daily as needed (acid reflux).     [name] aspirin EC 81 MG tablet Take 81 mg by mouth daily.    [name] atorvastatin (LIPITOR) 20 MG tablet Take 1 tablet 3 x /week 36 tablet 3  . azelastine (ASTELIN) 0.1 % nasal spray Place 2 sprays into both nostrils 2 (two) times daily as needed for rhinitis or allergies.    . B Complex Vitamins (VITAMIN B COMPLEX PO) Take 1 tablet by mouth at bedtime.     . bacitracin 500 UNIT/GM ointment Apply 1 application topically daily as needed for wound care.    . benzonatate (TESSALON) 200 MG capsule Take 1 capsule (200 mg total) by mouth 3 (three) times daily as needed for cough (Max: 677m per day). 30 capsule 0  . Blood Glucose Monitoring Suppl (ACCU-CHEK AVIVA PLUS) w/Device KIT Check blood sugar 1 time  daily 1 kit 0  . Blood Glucose Monitoring Suppl DEVI Test blood sugar up to three times a day or as directed. 1 each 0  . carvedilol (COREG) 6.25 MG tablet Take 1 tablet 3 x /day for BP & Heart 270 tablet 3  .  Cholecalciferol (VITAMIN D PO) Take 5,000 Units by mouth daily.     . colchicine 0.6 MG tablet Take 0.6 mg by mouth daily as needed (for gout flare up).     . Continuous Blood Gluc Receiver (FREESTYLE LIBRE 14 DAY READER) DEVI 1 Device by Does not apply route 3 (three) times daily. Dx: E11.22, N18.4, Z79.4 1 each 0  . Continuous Blood Gluc  Sensor (FREESTYLE LIBRE 14 DAY SENSOR) MISC Check blood sugar 3 times a day. Dx: E11.22, N18.4, Z79.4 2 each 12  . diphenhydrAMINE (BENADRYL) 25 MG tablet Take 25 mg by mouth at bedtime as needed for allergies.     . fluticasone (FLONASE) 50 MCG/ACT nasal spray Place 1 spray into both nostrils daily as needed for allergies. 16 g 3  . glucose blood (ACCU-CHEK AVIVA PLUS) test strip CHECK BLOOD GLUCOSE 3 TIMES DAILY. 300 strip 3  . insulin NPH-regular Human (70-30) 100 UNIT/ML injection Inject 47units into the skin subcutaneous every morning and inject 28units into the skin subcutaneous every evening    . lidocaine (LIDODERM) 5 % Place 1 patch onto the skin daily as needed (for pain). Remove & Discard patch within 12 hours or as directed by MD     . loratadine (CLARITIN) 10 MG tablet Take 10 mg by mouth daily as needed for allergies.     . metolazone (ZAROXOLYN) 5 MG tablet TAKE 1 TABLET BY MOUTH  DAILY AS NEEDED (WEIGHT  GAIN AND EDEMA) 20 tablet 0  . Multiple Vitamins-Minerals (PRESERVISION AREDS 2) CAPS Take 1 capsule by mouth 2 (two) times daily.     . polyvinyl alcohol (ARTIFICIAL TEARS) 1.4 % ophthalmic solution Place 1 drop into both eyes daily as needed for dry eyes.    . potassium chloride SA (KLOR-CON) 20 MEQ tablet Take 2 tablets 3 x /day for potassium 540 tablet 3  . sertraline (ZOLOFT) 100 MG tablet Take 1 tablet Daily for Mood 90 tablet 3  . torsemide (DEMADEX) 100 MG tablet Take 150 mg by mouth daily.    [name] warfarin (COUMADIN) 5 MG tablet Take 1 to 2 tablets Daily to Prevent Blood Clots 180 tablet 3   No current facility-administered medications for  this encounter.    Allergies  Allergen Reactions  . Sunflower Seed [Sunflower Oil] Swelling and Other (See Comments)    Tongue and lip swelling  . Horse-Derived Products Other (See Comments)    Per allergy skin test UNSPECIFIED REACTION   . Tetanus Toxoids Other (See Comments)    Per allergy skin test  . Tetanus Toxoid     Other reaction(s): Other (See Comments) Rash(horse serum)      Social History   Socioeconomic History  . Marital status: Married    Spouse name: Not on file  . Number of children: 1  . Years of education: Not on file  . Highest education level: Not on file  Occupational History  . Occupation: retired Software engineer  Tobacco Use  . Smoking status: Former Smoker    Packs/day: 4.00    Years: 25.00    Pack years: 100.00    Types: Cigarettes    Quit date: 01/05/1981    Years since quitting: 38.1  . Smokeless tobacco: Never Used  Substance and Sexual Activity  . Alcohol use: Yes    Alcohol/week: 1.0 standard drinks    Types: 1 Shots of liquor per week    Comment: 1-5 drinks per week  . Drug use: No  . Sexual activity: Not on file  Other Topics Concern  . Not on file  Social History Narrative   Daily caffeine-yes   Patient gets regular exercise.   Pt lives in [place] with spouse.  Retired Software engineer.  Social Determinants of Health   Financial Resource Strain:   . Difficulty of Paying Living Expenses: Not on file  Food Insecurity:   . Worried About Charity fundraiser in the Last Year: Not on file  . Ran Out of Food in the Last Year: Not on file  Transportation Needs:   . Lack of Transportation (Medical): Not on file  . Lack of Transportation (Non-Medical): Not on file  Physical Activity:   . Days of Exercise per Week: Not on file  . Minutes of Exercise per Session: Not on file  Stress:   . Feeling of Stress : Not on file  Social Connections:   . Frequency of Communication with Friends and Family: Not on file  . Frequency of Social Gatherings with Friends and Family: Not on file  . Attends Religious Services: Not on file  . Active Member of Clubs or Organizations: Not on file  . Attends Archivist Meetings: Not on file  . Marital Status: Not on file  Intimate Partner Violence:   . Fear of Current or Ex-Partner: Not on file  . Emotionally Abused: Not on file  . Physically Abused: Not on file  . Sexually Abused: Not on file      Family History  Problem Relation Age of Onset  . Dementia Father   . Colon cancer Mother        Family History/Uncle   . Colon polyps Mother        Family History  . Atrial fibrillation Mother   . Hypertension Mother   . Colon polyps Sister        Family history  . Diabetes Maternal Uncle   . Stroke Paternal Uncle     Vitals:   02/20/19 1421  BP: 110/70  Pulse: 81  SpO2: 97%  Weight: 86 kg (189 lb 9.6 oz)   Wt  Readings from Last 3 Encounters:  02/20/19 86 kg (189 lb 9.6 oz)  02/02/19 89.6 kg (197 lb 9.6 oz)  01/19/19 87.1 kg (192 lb)     PHYSICAL EXAM: General:  Elderly No resp difficulty HEENT: normal Neck: supple. JVP 8-9 Carotids 2+ bilat; no bruits. No lymphadenopathy or thryomegaly appreciated. Cor: PMI nondisplaced. Regular rate & rhythm. 2/6 TR Lungs: clear Abdomen: soft, nontender, + distended. No hepatosplenomegaly. No bruits or masses. Good bowel sounds. Extremities: no cyanosis, clubbing, rash, edema Neuro: alert & orientedx3, cranial nerves grossly intact. moves all 4 extremities w/o difficulty. Affect pleasant  ECG: NSR 75 RBBB Personally reviewed  ASSESSMENT  1. Chronic diastolic HF with R>>L symptoms - Symptomatically worse NYHA III-IIIb - Looks volume overloaded - Taking torsemide 150 daily will change back to torsemide 100/50. Take metolazone today and will probably need one dose every 1-2 weeks to see if this improves his volume status and functional capacity - Labs today - Echo today (02/20/19) LVEF 60-65% RV markedly dilated. Mod HK. RVSP 60.7 mmHg. Personally reviewed  2. RV failure & Pulmonary HTN - Has large left to right shunt through large anomalous pulmonary vein into SVC - Have reviewed with TCTS and Dr. Michaelle Birks at [facility]. No way to repair or baffle. Continue medical therapy - Echo today (02/20/19) LVEF 60-65% RV markedly dilated. Mod HK. RVSP 60.7 mmHg. Personally reviewed  3. CAD s/p CABG - No s/s ischemia - Continue statin. Off ASA due to warfarin  4. AF s/p Maze  - s/p ablation 10/15  - remains in NSR today.   - On coumadin. Has  been on DOAC in past and didn't like it because of bleeding  - No recent bleeding. Check CBC today  5. CKD, stage 3-4, baseline creatinine 1.7-1.8 - More recently creatinine has been 2.1-2.6 - Recheck today.  6. Anemia - Up from previous. - Had colon/EGD in 3/19. Followed by GI - Recheck today  7. RBBB - Stable    Glori Bickers, MD  3:00 PM

## 2019-02-20 NOTE — Progress Notes (Signed)
  Echocardiogram 2D Echocardiogram has been performed.  [name] 02/20/2019, 2:06 PM

## 2019-02-20 NOTE — Patient Instructions (Signed)
Change Torsemide to 100 mg (1 tab) in AM and 50 mg (1/2 tab) in PM  Take Metolazone 2.5 mg TOMORROW and then as needed for swelling  Labs done today, your results will be available in MyChart, we will contact you for abnormal readings.  Your physician recommends that you schedule a follow-up appointment in: 1 month with VIRTUAL Visit  At the [facility], you and your health needs are our priority. As part of our continuing mission to provide you with exceptional heart care, we have created designated Provider Care Teams. These Care Teams include your primary Cardiologist (physician) and Advanced Practice Providers (APPs- Physician Assistants and Nurse Practitioners) who all work together to provide you with the care you need, when you need it.   You may see any of the following providers on your designated Care Team at your next follow up: [name] Dr Glori Bickers . Dr Loralie Champagne . Darrick Grinder, NP . Lyda Jester, PA . Audry Riles, PharmD   Please be sure to bring in all your medications bottles to every appointment.

## 2019-02-21 NOTE — Progress Notes (Signed)
Assessment and Plan:   PAF (paroxysmal atrial fibrillation) (HCC) Check INR and will adjust medication according to labs.  Discussed if patient falls to immediately contact office or go to ER. Discussed foods that can increase or decrease Coumadin levels. Patient understands to call the office before starting a new medication. Follow up in one month.   Chronic diastolic congestive heart failure ([place]) Encouraged daily monitoring of the patient's weight, call office if 5 lb weight loss or gain in a day.  Encouraged regular exercise. If any increasing shortness of breath, swelling, or chest pressure go to ER immediately.  decrease your fluid intake to less than 1.5 L daily please remember to always increase your potassium intake with any increase of your fluid pill.   Hyperlipidemia associated with type 2 diabetes mellitus ([place]) Less hypoglycemia with decreasing insulin and doing night time snack Continue to monitor  Cardiomyopathy due to hypertension, with heart failure ([place]) Continue follow up with heart failure clinic  Type 2 diabetes mellitus with stage 4 chronic kidney disease, with long-term current use of insulin (HCC) -     Cancel: COMPLETE METABOLIC PANEL WITH GFR- had recently with heart failure clinic  Vitamin D deficiency Continue supplement  Essential hypertension - continue medications, DASH diet, exercise and monitor at home. Call if greater than 130/80.  -     CBC with Differential/Platelet  Senile purpura ([place]) Discussed process, protect skin, sunscreen  CKD stage 3 due to type 2 diabetes mellitus ([place]) Monitor  Pulmonary hypertension ([place]) Continue follow up  Recurrent major depressive disorder, in full remission (HCC) In remission  Chronic anticoagulation -     Protime-INR  Actinic keratoses 3 freeze and thaw technique after verbal permission Tolerated well   Further disposition pending results of labs. Discussed med's effects and SE's.   Over 30  minutes of exam, counseling, chart review, and critical decision making was performed.   Future Appointments  Date Time Provider [place]  03/20/2019  3:20 PM Bensimhon, Shaune Pascal, MD [facility] None  03/28/2019  2:30 PM [name], MD [facility] None  04/21/2019 12:30 PM Hayden Pedro, MD [facility] None  10/11/2019 10:00 AM [name], MD [facility] None    ------------------------------------------------------------------------------------------------------------------   HPI BP 126/64   Pulse 95   Temp (!) 97 F (36.1 C)   Wt 190 lb (86.2 kg)   SpO2 95%   BMI 26.50 kg/m   79 y.o.male presents for follow up on coumadin for p. A. fib, CHF, htn, CKD III/IV.  His blood pressure has been controlled at home, today their BP is BP: 126/64  He does not workout. He denies chest pain, shortness of breath, dizziness.  He will have an abnormal leg pain below the knee that will wake him up, he can not sleep well and it feels better when he walks or moves.   Patient has scaly areas on head and face he would like frozen off.   He has a history of Diastolic CHF, denies dyspnea on exertion, orthopnea, paroxysmal nocturnal dyspnea and edema. Positive for scant edema.  Follows with Dr. Missy Sabins, at home he is at goal weight of 180. He is taking torsemide 100 mg in AM and 50 mg PM, and does metolazone 5 mg every 7-10 days. Wt Readings from Last 3 Encounters:  02/22/19 190 lb (86.2 kg)  02/20/19 189 lb 9.6 oz (86 kg)  02/02/19 197 lb 9.6 oz (89.6 kg)   Patient is on Coumadin for Hyperlipidemia associated with type  2 diabetes mellitus ([place]) [E11.69, E78.5] Patient's last INR is  Lab Results  Component Value Date   INR 1.5 (H) 02/02/2019   INR 1.2 (H) 01/19/2019   INR 1.1 12/26/2018    Patient denies SOB, CP, dizziness, and blood in stool/urine.  His coumadin dose was changed last visit. He has not taken ABX, has not missed any doses. He did have a fall last night, mechanical  due to getting his foot stuck in the mud, did not hit his head.  Current dose: 2 tabs daily 5 days a week and 1 tab 2 days a week.  He is only on the 70/30 insulin for his DM due to his kidney function. States that his blood sugar has been abnormal, having swings. He states he is doing  in the morning based on last nights BS and  in the evening.  He is not having as many low sugars and his sugar is doing better over all.  Lab Results  Component Value Date   HGBA1C 7.1 (H) 11/15/2018     Lab Results  Component Value Date   GFRNONAA 36 (L) 02/20/2019       Past Medical History:  Diagnosis Date  . Adrenal adenoma   . Anemia   . Anxiety   . Arthritis   . Atypical atrial flutter ([place]) 8/15, 10/15   a. DCCV 08/2013. b. s/p RFA 10/2013.  [name] Basal cell carcinoma   . CAD (coronary artery disease)    a. 04/2013 CABG x 2: LIMA to LAD, SVG to RI, EVH via R thigh.  . Cellulitis 12/2015   left leg  . Chronic diastolic congestive heart failure ([place])   . CKD (chronic kidney disease), stage III   . Depression   . Diabetes mellitus type II   . Diverticulosis 2001  . DJD (degenerative joint disease)   . GERD (gastroesophageal reflux disease)   . Gout   . H/O hiatal hernia   . History of cardioversion    x3 (years uncertain)  . Hx of adenomatous colonic polyps   . Hyperlipidemia   . Hypertension   . Hypertensive cardiomyopathy ([place])   . Obstructive sleep apnea    compliant with CPAP  . 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 Persistent atrial fibrillation ([place])    a. s/p MAZE 04/2013 in setting of CABG. b. Amio stopped in 10/2013 after flutter ablation.  [name] PFO (patent foramen ovale)    a. Small PFO by TEE 10/2013.  [name] Pleural effusion, left    a. s/p thoracentesis 05/2013.  [name] Respiratory failure ([place])    a. Hypoxia 10/2013 - required supp O2 as inpatient, did not require it at  discharge.  . S/P Maze operation for atrial fibrillation    a. 04/2013: Complete bilateral atrial lesion set using cryothermy and bipolar radiofrequency ablation with clipping of LA appendage (@ time of CABG)  . S/P Maze operation for atrial fibrillation 04/05/2013   Complete bilateral atrial lesion set using cryothermy and bipolar radiofrequency ablation with clipping of LA appendage via median sternotomy approach      Allergies  Allergen Reactions  . Sunflower Seed [Sunflower Oil] Swelling and Other (See Comments)    Tongue and lip swelling  . Horse-Derived Products Other (See Comments)    Per allergy skin test UNSPECIFIED REACTION   . Tetanus Toxoids Other (See Comments)    Per allergy skin  test  . Tetanus Toxoid     Other reaction(s): Other (See Comments) Rash(horse serum)    Current Outpatient Medications on File Prior to Visit  Medication Sig  . acetaminophen (TYLENOL) 500 MG tablet Take 1,000 mg by mouth every 6 (six) hours as needed for moderate pain or headache.   . allopurinol (ZYLOPRIM) 300 MG tablet Take 300 mg by mouth daily.  [name] ALPRAZolam (XANAX) 1 MG tablet Take 0.5 tablets (0.5 mg total) by mouth at bedtime as needed for sleep.  [name] Alum Hydroxide-Mag Carbonate (GAVISCON PO) Take 4 tablets by mouth daily as needed (acid reflux).   [name] aspirin EC 81 MG tablet Take 81 mg by mouth daily.  [name] atorvastatin (LIPITOR) 20 MG tablet Take 1 tablet 3 x /week  . azelastine (ASTELIN) 0.1 % nasal spray Place 2 sprays into both nostrils 2 (two) times daily as needed for rhinitis or allergies.  . B Complex Vitamins (VITAMIN B COMPLEX PO) Take 1 tablet by mouth at bedtime.   . bacitracin 500 UNIT/GM ointment Apply 1 application topically daily as needed for wound care.  . benzonatate (TESSALON) 200 MG capsule Take 1 capsule (200 mg total) by mouth 3 (three) times daily as needed for cough (Max: 683m per day).  . Blood Glucose Monitoring Suppl (ACCU-CHEK AVIVA PLUS) w/Device KIT Check blood  sugar 1 time  daily  . Blood Glucose Monitoring Suppl DEVI Test blood sugar up to three times a day or as directed.  . carvedilol (COREG) 6.25 MG tablet Take 1 tablet 3 x /day for BP & Heart  . Cholecalciferol (VITAMIN D PO) Take 5,000 Units by mouth daily.   . colchicine 0.6 MG tablet Take 0.6 mg by mouth daily as needed (for gout flare up).   . Continuous Blood Gluc Receiver (FREESTYLE LIBRE 14 DAY READER) DEVI 1 Device by Does not apply route 3 (three) times daily. Dx: E11.22, N18.4, Z79.4  . Continuous Blood Gluc Sensor (FREESTYLE LIBRE 14 DAY SENSOR) MISC Check blood sugar 3 times a day. Dx: E11.22, N18.4, Z79.4  . diphenhydrAMINE (BENADRYL) 25 MG tablet Take 25 mg by mouth at bedtime as needed for allergies.   . fluticasone (FLONASE) 50 MCG/ACT nasal spray Place 1 spray into both nostrils daily as needed for allergies.  .Marland Kitchenglucose blood (ACCU-CHEK AVIVA PLUS) test strip CHECK BLOOD GLUCOSE 3 TIMES DAILY.  .Marland Kitcheninsulin NPH-regular Human (70-30) 100 UNIT/ML injection Inject 47units into the skin subcutaneous every morning and inject 28units into the skin subcutaneous every evening  . lidocaine (LIDODERM) 5 % Place 1 patch onto the skin daily as needed (for pain). Remove & Discard patch within 12 hours or as directed by MD   . loratadine (CLARITIN) 10 MG tablet Take 10 mg by mouth daily as needed for allergies.   . metolazone (ZAROXOLYN) 2.5 MG tablet Take 1 tablet (2.5 mg total) by mouth daily as needed (for swelling).  . Multiple Vitamins-Minerals (PRESERVISION AREDS 2) CAPS Take 1 capsule by mouth 2 (two) times daily.   . polyvinyl alcohol (ARTIFICIAL TEARS) 1.4 % ophthalmic solution Place 1 drop into both eyes daily as needed for dry eyes.  . potassium chloride SA (KLOR-CON) 20 MEQ tablet Take 2 tablets 3 x /day for potassium  . sertraline (ZOLOFT) 100 MG tablet Take 1 tablet Daily for Mood  . torsemide (DEMADEX) 100 MG tablet Take 1 tablet (100 mg total) by mouth every morning AND 0.5 tablets  (50 mg total) every evening.  . warfarin (COUMADIN)  5 MG tablet Take 1 to 2 tablets Daily to Prevent Blood Clots   No current facility-administered medications on file prior to visit.    ROS: Review of Systems  Constitutional: Negative for malaise/fatigue and weight loss.  HENT: Negative for congestion, hearing loss and tinnitus.   Eyes: Negative for blurred vision and double vision.  Respiratory: Negative for cough, sputum production, shortness of breath and wheezing.   Cardiovascular: Negative for chest pain, palpitations, orthopnea, claudication, leg swelling and PND.  Gastrointestinal: Negative for abdominal pain, blood in stool, constipation, diarrhea, heartburn, melena, nausea and vomiting.  Genitourinary: Negative.   Musculoskeletal: Negative for joint pain and myalgias.  Skin: Negative for rash.  Neurological: Negative for dizziness, tingling, sensory change, weakness and headaches.  Endo/Heme/Allergies: Negative for polydipsia. Bruises/bleeds easily.  Psychiatric/Behavioral: Negative.   All other systems reviewed and are negative.    Physical Exam:  BP 126/64   Pulse 95   Temp (!) 97 F (36.1 C)   Wt 190 lb (86.2 kg)   SpO2 95%   BMI 26.50 kg/m   General Appearance: Well nourished, in no apparent distress. Eyes: PERRLA, EOMs, conjunctiva no swelling or erythema Sinuses: No Frontal/maxillary tenderness ENT/Mouth: Ext aud canals clear, TMs without erythema, bulging. No erythema, swelling, or exudate on post pharynx.  Tonsils not swollen or erythematous. Hearing normal.  Neck: Supple, thyroid normal.  Respiratory: Respiratory effort normal, BS equal bilaterally without rales, rhonchi, wheezing or stridor.  Cardio: RRR with no MRGs. Brisk peripheral pulses with scant non-pitting edema   Abdomen: Soft, obese/mildly distended, + BS.  Non tender, no guarding, rebound, hernias, masses. Lymphatics: Non tender without lymphadenopathy.  Musculoskeletal: Full ROM, 5/5  strength, normal gait.  Skin: Warm, dry without rashes, lesions; he has fragile skin and numerous small ecchymoses to bilateral upper extremities. Top of head with erythematous scaly nodules.  Neuro: Cranial nerves intact. Normal muscle tone, no cerebellar symptoms. Sensation intact.  Psych: Awake and oriented X 3, normal affect, Insight and Judgment appropriate.     Vicie Mutters, PA-C 2:41 PM [facility]

## 2019-02-22 ENCOUNTER — Other Ambulatory Visit: Payer: Self-pay

## 2019-02-22 ENCOUNTER — Ambulatory Visit (INDEPENDENT_AMBULATORY_CARE_PROVIDER_SITE_OTHER): Payer: Medicare Other | Admitting: Physician Assistant

## 2019-02-22 ENCOUNTER — Encounter: Payer: Self-pay | Admitting: Physician Assistant

## 2019-02-22 VITALS — BP 126/64 | HR 95 | Temp 97.0°F | Wt 190.0 lb

## 2019-02-22 DIAGNOSIS — I10 Essential (primary) hypertension: Secondary | ICD-10-CM

## 2019-02-22 DIAGNOSIS — N183 Chronic kidney disease, stage 3 unspecified: Secondary | ICD-10-CM

## 2019-02-22 DIAGNOSIS — F3342 Major depressive disorder, recurrent, in full remission: Secondary | ICD-10-CM

## 2019-02-22 DIAGNOSIS — I272 Pulmonary hypertension, unspecified: Secondary | ICD-10-CM

## 2019-02-22 DIAGNOSIS — E559 Vitamin D deficiency, unspecified: Secondary | ICD-10-CM

## 2019-02-22 DIAGNOSIS — L570 Actinic keratosis: Secondary | ICD-10-CM

## 2019-02-22 DIAGNOSIS — E1169 Type 2 diabetes mellitus with other specified complication: Secondary | ICD-10-CM | POA: Diagnosis not present

## 2019-02-22 DIAGNOSIS — I43 Cardiomyopathy in diseases classified elsewhere: Secondary | ICD-10-CM

## 2019-02-22 DIAGNOSIS — I480 Paroxysmal atrial fibrillation: Secondary | ICD-10-CM | POA: Diagnosis not present

## 2019-02-22 DIAGNOSIS — N184 Chronic kidney disease, stage 4 (severe): Secondary | ICD-10-CM

## 2019-02-22 DIAGNOSIS — Z7901 Long term (current) use of anticoagulants: Secondary | ICD-10-CM

## 2019-02-22 DIAGNOSIS — E785 Hyperlipidemia, unspecified: Secondary | ICD-10-CM

## 2019-02-22 DIAGNOSIS — D692 Other nonthrombocytopenic purpura: Secondary | ICD-10-CM

## 2019-02-22 DIAGNOSIS — I5032 Chronic diastolic (congestive) heart failure: Secondary | ICD-10-CM | POA: Diagnosis not present

## 2019-02-22 DIAGNOSIS — Z794 Long term (current) use of insulin: Secondary | ICD-10-CM

## 2019-02-22 DIAGNOSIS — I110 Hypertensive heart disease with heart failure: Secondary | ICD-10-CM | POA: Diagnosis not present

## 2019-02-22 DIAGNOSIS — E1122 Type 2 diabetes mellitus with diabetic chronic kidney disease: Secondary | ICD-10-CM

## 2019-02-22 NOTE — Patient Instructions (Signed)

## 2019-02-23 LAB — CBC WITH DIFFERENTIAL/PLATELET
Absolute Monocytes: 1012 cells/uL — ABNORMAL HIGH (ref 200–950)
Basophils Absolute: 69 cells/uL (ref 0–200)
Basophils Relative: 0.6 %
Eosinophils Absolute: 104 cells/uL (ref 15–500)
Eosinophils Relative: 0.9 %
HCT: 41.8 % (ref 38.5–50.0)
Hemoglobin: 13.8 g/dL (ref 13.2–17.1)
Lymphs Abs: 1208 cells/uL (ref 850–3900)
MCH: 30.9 pg (ref 27.0–33.0)
MCHC: 33 g/dL (ref 32.0–36.0)
MCV: 93.5 fL (ref 80.0–100.0)
MPV: 9.2 fL (ref 7.5–12.5)
Monocytes Relative: 8.8 %
Neutro Abs: 9108 cells/uL — ABNORMAL HIGH (ref 1500–7800)
Neutrophils Relative %: 79.2 %
Platelets: 287 10*3/uL (ref 140–400)
RBC: 4.47 10*6/uL (ref 4.20–5.80)
RDW: 14 % (ref 11.0–15.0)
Total Lymphocyte: 10.5 %
WBC: 11.5 10*3/uL — ABNORMAL HIGH (ref 3.8–10.8)

## 2019-02-23 LAB — PROTIME-INR
INR: 2.4 — ABNORMAL HIGH
Prothrombin Time: 23.5 s — ABNORMAL HIGH (ref 9.0–11.5)

## 2019-03-20 ENCOUNTER — Ambulatory Visit (HOSPITAL_COMMUNITY)
Admission: RE | Admit: 2019-03-20 | Discharge: 2019-03-20 | Disposition: A | Discharge status: Home / Self Care | Payer: Medicare Other | Source: Ambulatory Visit | Attending: Internal Medicine | Admitting: Internal Medicine

## 2019-03-20 DIAGNOSIS — I2510 Atherosclerotic heart disease of native coronary artery without angina pectoris: Secondary | ICD-10-CM | POA: Diagnosis not present

## 2019-03-20 DIAGNOSIS — I272 Pulmonary hypertension, unspecified: Secondary | ICD-10-CM | POA: Diagnosis not present

## 2019-03-20 DIAGNOSIS — N1832 Chronic kidney disease, stage 3b: Secondary | ICD-10-CM

## 2019-03-20 DIAGNOSIS — I480 Paroxysmal atrial fibrillation: Secondary | ICD-10-CM

## 2019-03-20 NOTE — Progress Notes (Signed)
Heart Failure TeleHealth Note  Due to national recommendations of social distancing due to [place] 19, Audio/video telehealth visit is felt to be most appropriate for this patient at this time.  See MyChart message from today for patient consent regarding telehealth for [facility].  Date:  03/20/2019   ID:  [name]., DOB 22-May-1940, MRN [id number]  Location: Home  Provider location: [facility] Type of Visit: Established patient  PCP:  [name], MD  Cardiologist:  No primary care provider on file. Primary HF: Kendalynn Wideman  Chief Complaint: Heart Failure follow-up   History of Present Illness:  [name] is a 79 y/o male with COPD , DM, PAF, 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, CKD, AFL s/p ablation in 10/15. 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 Had surgery in 4/15. Did well for a while went to cardiac rehab and was feeling fine.  Developed AFL in 10/15 and underwent RFA.   In 3/16 began to develop severe SOB. Started O2. Says his symptoms got worse almost overnight. 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 He has seen Dr. Roxy Manns but felt to have no optimal surgical options for repair. His case was also presented to Dr. Michaelle Birks at [facility] who agreed that there was no way to baffle or reroute the anomalous PV flow to the LA. He had a TEE which showed LVEF 60-65% with a dilated right side and a small PFO. He has also been seen by Dr. Lake Bells who performed PFTs that showed significant restrictive lung disease with a low DLCO. He had f/u with Dr. Gilles Chiquito in the [facility] who felt his symptoms were multifactorial.  Had colonoscopy in 2019 and found to have a polyp and what sounds like AVMs. Subsequently had a laparoscopic removal of appendiceal adenoma which was benign.   I saw him in 2/21 and had  progressive SOB. I felt he was volume overloaded. Torsemide changed to 100/50 and prn metolazone added.   He presents via Engineer, civil (consulting) for a telehealth visit today. Says he took metolazone 2x since he saw [place]. Weight went down to 182.6. Felt a little bit better but didn't make a whole lot of difference. SOB with mild activity. No edema, orthopnea or PND. No dizziness.   Cardiac studies:  Echo 02/20/19 LVEF 60-65% RV markedly dilated. Mod HK. RVSP 60.7 mmHg. Personally reviewed  Echo 12/18 LVEF 60% RV dilated mildly HK   PFTs (7/16) FEV1 1.45 L (45%) FVC 1.77 L (40%) DLCO 46%  RHC 4/16 RA = 18 RV = 72/4/17 PA = 76/27 (46) PCW = 21 Fick cardiac output/index (using PA sat) = 9.2/4.45 Thermo CO/CI = 10.0/4.87 PVR = 2.2 WU Fick cardiac output/index (using high SVC sat) = 5.2/2.5 Pulse-ox saturation = 89%  High SVC sat = 54% Low SVC sat = 81% (at SVC/RA junction) RA sat = 68% RV sat = 66% PA sat = 68%, 69% IVC sat =56%   VQ/CT negative for PE   TEE 10/15 small PFO  Ab u/s 6/16 liver normal + ascites. Medico renal kidney disease.     [name] denies symptoms worrisome for COVID 19.   Past Medical History:  Diagnosis Date  . Adrenal adenoma   . Anemia   . Anxiety   . Arthritis   .  Atypical atrial flutter ([place]) 8/15, 10/15   a. DCCV 08/2013. b. s/p RFA 10/2013.  [name] Basal cell carcinoma   . CAD (coronary artery disease)    a. 04/2013 CABG x 2: LIMA to LAD, SVG to RI, EVH via R thigh.  . Cellulitis 12/2015   left leg  . Chronic diastolic congestive heart failure ([place])   . CKD (chronic kidney disease), stage III   . Depression   . Diabetes mellitus type II   . Diverticulosis 2001  . DJD (degenerative joint disease)   . GERD (gastroesophageal reflux disease)   . Gout   . H/O hiatal hernia   . History of cardioversion    x3 (years uncertain)  . Hx of adenomatous colonic polyps   . Hyperlipidemia   . Hypertension   . Hypertensive  cardiomyopathy ([place])   . Obstructive sleep apnea    compliant with CPAP  . 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 Persistent atrial fibrillation ([place])    a. s/p MAZE 04/2013 in setting of CABG. b. Amio stopped in 10/2013 after flutter ablation.  [name] PFO (patent foramen ovale)    a. Small PFO by TEE 10/2013.  [name] Pleural effusion, left    a. s/p thoracentesis 05/2013.  [name] Respiratory failure ([place])    a. Hypoxia 10/2013 - required supp O2 as inpatient, did not require it at discharge.  . S/P Maze operation for atrial fibrillation    a. 04/2013: Complete bilateral atrial lesion set using cryothermy and bipolar radiofrequency ablation with clipping of LA appendage (@ time of CABG)  . S/P Maze operation for atrial fibrillation 04/05/2013   Complete bilateral atrial lesion set using cryothermy and bipolar radiofrequency ablation with clipping of LA appendage via median sternotomy approach    Past Surgical History:  Procedure Laterality Date  . APPENDECTOMY  03/05/2017   laproscopic  . ATRIAL FIBRILLATION ABLATION N/A 10/26/2013   Procedure: ATRIAL FIBRILLATION ABLATION;  Surgeon: Coralyn Mark, MD;  Location: [facility];  Service: Cardiovascular;  Laterality: N/A;  . BASAL CELL CARCINOMA EXCISION     x3 on face  . CARDIAC CATHETERIZATION     myocardial bridge but no cad  . CARDIOVERSION N/A 08/23/2013   Procedure: CARDIOVERSION;  Surgeon: Sanda Klein, MD;  Location: [place];  Service: Cardiovascular;  Laterality: N/A;  . CARPOMETACARPEL SUSPENSION PLASTY Left 02/14/2014   Procedure: CARPOMETACARPEL ([place]) SUSPENSIONPLASTY THUMB  WITH  ABDUCTOR POLLICIS LONGUS TRANSFER AND STENOSING TENOSYNOVITIS RELEASE LEFT WRIST;  Surgeon: Charlotte Crumb, MD;  Location: [facility];  Service: Orthopedics;  Laterality: Left;  . CATARACT EXTRACTION     bilateral  . COLONOSCOPY WITH  PROPOFOL N/A 01/26/2017   Procedure: COLONOSCOPY WITH PROPOFOL;  Surgeon: Doran Stabler, MD;  Location: [facility];  Service: Gastroenterology;  Laterality: N/A;  . CORONARY ARTERY BYPASS GRAFT N/A 04/05/2013   Procedure: CORONARY ARTERY BYPASS GRAFTING (CABG) TIMES TWO USING LEFT INTERNAL MAMMARY ARTERY AND RIGHT SAPHENOUS LEG VEIN HARVESTED ENDOSCOPICALLY;  Surgeon: Rexene Alberts, MD;  Location: [facility];  Service: Open Heart Surgery;  Laterality: N/A;  . ESOPHAGOGASTRODUODENOSCOPY (EGD) WITH PROPOFOL N/A 01/26/2017   Procedure: ESOPHAGOGASTRODUODENOSCOPY (EGD) WITH PROPOFOL;  Surgeon: Doran Stabler, MD;  Location: [facility];  Service: Gastroenterology;  Laterality: N/A;  . GREAT TOE ARTHRODESIS, INTERPHALANGEAL JOINT     Right foot  . INTRAOPERATIVE TRANSESOPHAGEAL ECHOCARDIOGRAM N/A 04/05/2013  Procedure: INTRAOPERATIVE TRANSESOPHAGEAL ECHOCARDIOGRAM;  Surgeon: Rexene Alberts, MD;  Location: [facility];  Service: Open Heart Surgery;  Laterality: N/A;  . LAPAROSCOPIC APPENDECTOMY N/A 03/05/2017   Procedure: APPENDECTOMY LAPAROSCOPIC;  Surgeon: Judeth Horn, MD;  Location: [facility];  Service: General;  Laterality: N/A;  . LEFT HEART CATHETERIZATION WITH CORONARY ANGIOGRAM N/A 03/07/2013   Procedure: LEFT HEART CATHETERIZATION WITH CORONARY ANGIOGRAM;  Surgeon: Burnell Blanks, MD;  Location: [facility] CATH LAB;  Service: Cardiovascular;  Laterality: N/A;  . MAZE N/A 04/05/2013   Procedure: MAZE;  Surgeon: Rexene Alberts, MD;  Location: [place];  Service: Open Heart Surgery;  Laterality: N/A;  . Polinydal cyst     Removed  . POLYPECTOMY    . Retina repair-right    . RIGHT HEART CATHETERIZATION N/A 05/03/2014   Procedure: RIGHT HEART CATH;  Surgeon: Jolaine Artist, MD;  Location: [facility] CATH LAB;  Service: Cardiovascular;  Laterality: N/A;  . TEE WITHOUT CARDIOVERSION N/A 08/23/2013   Procedure: TRANSESOPHAGEAL ECHOCARDIOGRAM (TEE);  Surgeon: Sanda Klein, MD;  Location: [facility] ENDOSCOPY;  Service:  Cardiovascular;  Laterality: N/A;  . TEE WITHOUT CARDIOVERSION N/A 10/26/2013   Procedure: TRANSESOPHAGEAL ECHOCARDIOGRAM (TEE);  Surgeon: Sueanne Margarita, MD;  Location: [facility] ENDOSCOPY;  Service: Cardiovascular;  Laterality: N/A;  . TEE WITHOUT CARDIOVERSION N/A 06/05/2014   Procedure: TRANSESOPHAGEAL ECHOCARDIOGRAM (TEE);  Surgeon: Thayer Headings, MD;  Location: [facility];  Service: Cardiovascular;  Laterality: N/A;  . TRAPEZIUM RESECTION       Current Outpatient Medications  Medication Sig Dispense Refill  . acetaminophen (TYLENOL) 500 MG tablet Take 1,000 mg by mouth every 6 (six) hours as needed for moderate pain or headache.     . allopurinol (ZYLOPRIM) 300 MG tablet Take 300 mg by mouth daily.    [name] ALPRAZolam (XANAX) 1 MG tablet Take 0.5 tablets (0.5 mg total) by mouth at bedtime as needed for sleep. 60 tablet 0  . Alum Hydroxide-Mag Carbonate (GAVISCON PO) Take 4 tablets by mouth daily as needed (acid reflux).     [name] aspirin EC 81 MG tablet Take 81 mg by mouth daily.    [name] atorvastatin (LIPITOR) 20 MG tablet Take 1 tablet 3 x /week 36 tablet 3  . azelastine (ASTELIN) 0.1 % nasal spray Place 2 sprays into both nostrils 2 (two) times daily as needed for rhinitis or allergies.    . B Complex Vitamins (VITAMIN B COMPLEX PO) Take 1 tablet by mouth at bedtime.     . bacitracin 500 UNIT/GM ointment Apply 1 application topically daily as needed for wound care.    . benzonatate (TESSALON) 200 MG capsule Take 1 capsule (200 mg total) by mouth 3 (three) times daily as needed for cough (Max: '600mg'$  per day). 30 capsule 0  . Blood Glucose Monitoring Suppl (ACCU-CHEK AVIVA PLUS) w/Device KIT Check blood sugar 1 time  daily 1 kit 0  . Blood Glucose Monitoring Suppl DEVI Test blood sugar up to three times a day or as directed. 1 each 0  . carvedilol (COREG) 6.25 MG tablet Take 1 tablet 3 x /day for BP & Heart 270 tablet 3  . Cholecalciferol (VITAMIN D PO) Take 5,000 Units by mouth daily.     .  colchicine 0.6 MG tablet Take 0.6 mg by mouth daily as needed (for gout flare up).     . Continuous Blood Gluc Receiver (FREESTYLE LIBRE 14 DAY READER) DEVI 1 Device by Does not apply route 3 (three) times daily. Dx: E11.22,  N18.4, Z79.4 1 each 0  . Continuous Blood Gluc Sensor (FREESTYLE LIBRE 14 DAY SENSOR) MISC Check blood sugar 3 times a day. Dx: E11.22, N18.4, Z79.4 2 each 12  . diphenhydrAMINE (BENADRYL) 25 MG tablet Take 25 mg by mouth at bedtime as needed for allergies.     . fluticasone (FLONASE) 50 MCG/ACT nasal spray Place 1 spray into both nostrils daily as needed for allergies. 16 g 3  . glucose blood (ACCU-CHEK AVIVA PLUS) test strip CHECK BLOOD GLUCOSE 3 TIMES DAILY. 300 strip 3  . insulin NPH-regular Human (70-30) 100 UNIT/ML injection Inject 47units into the skin subcutaneous every morning and inject 28units into the skin subcutaneous every evening    . lidocaine (LIDODERM) 5 % Place 1 patch onto the skin daily as needed (for pain). Remove & Discard patch within 12 hours or as directed by MD     . loratadine (CLARITIN) 10 MG tablet Take 10 mg by mouth daily as needed for allergies.     . metolazone (ZAROXOLYN) 2.5 MG tablet Take 1 tablet (2.5 mg total) by mouth daily as needed (for swelling). 15 tablet 3  . Multiple Vitamins-Minerals (PRESERVISION AREDS 2) CAPS Take 1 capsule by mouth 2 (two) times daily.     . polyvinyl alcohol (ARTIFICIAL TEARS) 1.4 % ophthalmic solution Place 1 drop into both eyes daily as needed for dry eyes.    . potassium chloride SA (KLOR-CON) 20 MEQ tablet Take 2 tablets 3 x /day for potassium 540 tablet 3  . sertraline (ZOLOFT) 100 MG tablet Take 1 tablet Daily for Mood 90 tablet 3  . torsemide (DEMADEX) 100 MG tablet Take 1 tablet (100 mg total) by mouth every morning AND 0.5 tablets (50 mg total) every evening. 45 tablet 3  . warfarin (COUMADIN) 5 MG tablet Take 1 to 2 tablets Daily to Prevent Blood Clots 180 tablet 3   No current facility-administered  medications for this encounter.    Allergies:   Sunflower seed [sunflower oil], Horse-derived products, Tetanus toxoids, and Tetanus toxoid   Social History:  The patient  reports that he quit smoking about 38 years ago. His smoking use included cigarettes. He has a 100.00 pack-year smoking history. He has never used smokeless tobacco. He reports current alcohol use of about 1.0 standard drinks of alcohol per week. He reports that he does not use drugs.   Family History:  The patient's family history includes Atrial fibrillation in his mother; Colon cancer in his mother; Colon polyps in his mother and sister; Dementia in his father; Diabetes in his maternal uncle; Hypertension in his mother; Stroke in his paternal uncle.   ROS:  Please see the history of present illness.   All other systems are personally reviewed and negative.   Exam:  (Video/Tele Health Call; Exam is subjective and or/visual.) General:  Speaks in full sentences. No resp difficulty. Lungs: Normal respiratory effort with conversation.  Abdomen: Non-distended per patient report Extremities: Pt denies edema. Neuro: Alert & oriented x 3.   Recent Labs: 09/08/2018: TSH 1.70 11/15/2018: Magnesium 2.3 12/19/2018: ALT 23 02/20/2019: B Natriuretic Peptide 355.7; BUN 37; Creatinine, Ser 1.74; Potassium 3.6; Sodium 141 02/22/2019: Hemoglobin 13.8; Platelets 287  Personally reviewed   Wt Readings from Last 3 Encounters:  02/22/19 86.2 kg (190 lb)  02/20/19 86 kg (189 lb 9.6 oz)  02/02/19 89.6 kg (197 lb 9.6 oz)      ASSESSMENT AND PLAN:  1. Chronic diastolic HF with R>>L symptoms - Remains NYHA  III-IIIb - Suspect he is still volume overloaded - Continue torsemide 100/50. Has only taken metolazone 2x since we last saw him - Will have him take metolazone for next 2 days and see if we can get weight down to 179-180 and if that makes him feel better. If so, take metolazone once a week. Take k 40 with every dose of metolazone -  Echo 02/20/19 LVEF 60-65% RV markedly dilated. Mod HK. RVSP 60.7 mmHg. Personally reviewed  2. RV failure & Pulmonary HTN - Has large left to right shunt through large anomalous pulmonary vein into SVC - Have reviewed with TCTS and Dr. Michaelle Birks at [facility]. No way to repair or baffle. Continue medical therapy - Echo 02/20/19 LVEF 60-65% RV markedly dilated. Mod HK. RVSP 60.7 mmHg.  3. CAD s/p CABG - No s/s ischemia - Continue statin. Off ASA due to warfarin  4. AF s/p Maze  - s/p ablation 10/15  - remains in NSR today.   - On coumadin. Has been on DOAC in past and didn't like it because of bleeding  - No recent bleeding. Check CBC today  5. CKD, stage 3-4, baseline creatinine 1.7-1.8 - More recently creatinine stable at 1.7 - Recheck at next visit  6. Anemia - hgb 13.8 on last check  7. RBBB - Stable    COVID screen The patient does not have any symptoms that suggest any further testing/ screening at this time.  Social distancing reinforced today.  Recommended follow-up:  As above  Relevant cardiac medications were reviewed at length with the patient today.   The patient does not have concerns regarding their medications at this time.   The following changes were made today:  As above  Today, I have spent 14 minutes with the patient with telehealth technology discussing the above issues .    Signed, Glori Bickers, MD  03/20/2019 4:06 PM  [facility] [place] [address] Heart and [place] [address] [phone] (office) [phone] (fax)

## 2019-03-21 ENCOUNTER — Telehealth (HOSPITAL_COMMUNITY): Payer: Self-pay | Admitting: *Deleted

## 2019-03-21 NOTE — Telephone Encounter (Signed)
Pt left VM returning call I called pt back he did not answer/left VM requesting a return call.

## 2019-03-21 NOTE — Telephone Encounter (Signed)
Bensimhon, Shaune Pascal, MD  You 16 hours ago (4:25 PM)   Have him take metolazone for next 2 days and see if we can get weight down to 179-180 and if that makes him feel better. If so, take metolazone once a week. Take k 40 with every dose of metolazone  F/u visit 1 month with me     Attempted to call pt to review instructions from virtual visit yesterday and to sch f/u appt.  Left message to call back, did go ahead and sch f/u for 4/22 at 9:40 AM

## 2019-03-22 NOTE — Telephone Encounter (Signed)
Per [facility] nurse pt aware of med changes and has lost 5lbs since taking metolazone for two days.

## 2019-03-27 ENCOUNTER — Encounter: Payer: Self-pay | Admitting: Internal Medicine

## 2019-03-27 DIAGNOSIS — M1A00X0 Idiopathic chronic gout, unspecified site, without tophus (tophi): Secondary | ICD-10-CM | POA: Insufficient documentation

## 2019-03-27 NOTE — Progress Notes (Signed)
History of Present Illness:       This very nice 79 y.o. 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  Patient has hx/o Gout quiescent on Allopurinol.  He also is on CPAP for OSA with improved Restorative Sleep.       Patient is treated for HTN (1980)  & BP has been controlled at home. Today's BP is at goal - 126/74.   He has hx/o CABG & MAZE procedure (2015) & also has had RFA for Afib & Flutter. He is on Coumadin w/o hx/o bleeding dyscrasia / complications.   He is followed in the [facility] by Dr [name]. 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  Patient has had no complaints of any cardiac type chest pain, palpitations. Admits DOE and denies  orthopnea / PND. Also, he denies dizziness, claudication or dependent edema.      Hyperlipidemia is controlled with diet & meds. Patient denies myalgias or other med SE's. 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 Patient is on Novolin 70/30 at 47u & 28u am & pm  and has had no symptoms of reactive hypoglycemia, diabetic polys, paresthesias or visual blurring.  Last A1c was not at goal:  Lab Results  Component Value Date   HGBA1C 7.1 (H) 11/15/2018           Further, the patient also has history of Vitamin D Deficiency ("39" / 2008)  and supplements vitamin D without any suspected side-effects. Last vitamin D was near goal (70-100):  Lab Results  Component Value Date   VD25OH 57 11/15/2018    Current Outpatient Medications on File Prior to Visit  Medication Sig  . acetaminophen (TYLENOL) 500 MG tablet Take 1,000 mg by mouth every 6 (six) hours as  needed for moderate pain or headache.   . allopurinol (ZYLOPRIM) 300 MG tablet Take 300 mg by mouth daily.  [name] ALPRAZolam (XANAX) 1 MG tablet Take 0.5 tablets (0.5 mg total) by mouth at bedtime as needed for sleep.  [name] Alum Hydroxide-Mag Carbonate (GAVISCON PO) Take 4 tablets by mouth daily as needed (acid reflux).   [name] aspirin EC 81 MG tablet Take 81 mg by mouth daily.  [name] atorvastatin (LIPITOR) 20 MG tablet Take 1 tablet 3 x /week  . azelastine (ASTELIN) 0.1 % nasal spray Place 2 sprays into both nostrils 2 (two) times daily as needed for rhinitis or allergies.  . B Complex Vitamins (VITAMIN B COMPLEX PO) Take 1 tablet by mouth at bedtime.   . bacitracin 500 UNIT/GM ointment Apply 1 application topically daily as needed for wound care.  . benzonatate (TESSALON) 200 MG capsule Take 1 capsule (200 mg total) by mouth 3 (three) times daily as needed for cough (Max: 639m per day).  . Blood Glucose Monitoring Suppl (ACCU-CHEK AVIVA PLUS) w/Device KIT Check blood sugar 1 time  daily  . Blood Glucose Monitoring Suppl DEVI Test blood sugar up to three times a day or as directed.  . carvedilol (COREG) 6.25 MG tablet Take 1 tablet  3 x /day for BP & Heart  . Cholecalciferol (VITAMIN D PO) Take 5,000 Units by mouth daily.   . colchicine 0.6 MG tablet Take 0.6 mg by mouth daily as needed (for gout flare up).   . Continuous Blood Gluc Receiver (FREESTYLE LIBRE 14 DAY READER) DEVI 1 Device by Does not apply route 3 (three) times daily. Dx: E11.22, N18.4, Z79.4  . Continuous Blood Gluc Sensor (FREESTYLE LIBRE 14 DAY SENSOR) MISC Check blood sugar 3 times a day. Dx: E11.22, N18.4, Z79.4  . diphenhydrAMINE (BENADRYL) 25 MG tablet Take 25 mg by mouth at bedtime as needed for allergies.   . fluticasone (FLONASE) 50 MCG/ACT nasal spray Place 1 spray into both nostrils daily as needed for allergies.  [name] glucose blood (ACCU-CHEK AVIVA PLUS) test strip CHECK BLOOD GLUCOSE 3 TIMES DAILY.  [name] insulin NPH-regular Human (70-30)  100 UNIT/ML injection Inject 47units into the skin subcutaneous every morning and inject 28units into the skin subcutaneous every evening  . lidocaine (LIDODERM) 5 % Place 1 patch onto the skin daily as needed (for pain). Remove & Discard patch within 12 hours or as directed by MD   . loratadine (CLARITIN) 10 MG tablet Take 10 mg by mouth daily as needed for allergies.   . metolazone (ZAROXOLYN) 2.5 MG tablet Take 1 tablet (2.5 mg total) by mouth daily as needed (for swelling).  . Multiple Vitamins-Minerals (PRESERVISION AREDS 2) CAPS Take 1 capsule by mouth 2 (two) times daily.   . polyvinyl alcohol (ARTIFICIAL TEARS) 1.4 % ophthalmic solution Place 1 drop into both eyes daily as needed for dry eyes.  . potassium chloride SA (KLOR-CON) 20 MEQ tablet Take 2 tablets 3 x /day for potassium  . sertraline (ZOLOFT) 100 MG tablet Take 1 tablet Daily for Mood  . torsemide (DEMADEX) 100 MG tablet Take 1 tablet (100 mg total) by mouth every morning AND 0.5 tablets (50 mg total) every evening.  . warfarin (COUMADIN) 5 MG tablet Take 1 to 2 tablets Daily to Prevent Blood Clots   No current facility-administered medications on file prior to visit.    Allergies  Allergen Reactions  . Sunflower Seed [Sunflower Oil] Swelling and Other (See Comments)    Tongue and lip swelling  . Horse-Derived Products Other (See Comments)    Per allergy skin test UNSPECIFIED REACTION   . Tetanus Toxoids Other (See Comments)    Per allergy skin test  . Tetanus Toxoid     Other reaction(s): Other (See Comments) Rash(horse serum)    PMHx:   Past Medical History:  Diagnosis Date  . Adrenal adenoma   . Anemia   . Anxiety   . Arthritis   . Atypical atrial flutter ([place]) 8/15, 10/15   a. DCCV 08/2013. b. s/p RFA 10/2013.  [name] Basal cell carcinoma   . CAD (coronary artery disease)    a. 04/2013 CABG x 2: LIMA to LAD, SVG to RI, EVH via R thigh.  . Cellulitis 12/2015   left leg  . Chronic diastolic congestive heart  failure ([place])   . CKD (chronic kidney disease), stage III   . Depression   . Diabetes mellitus type II   . Diverticulosis 2001  . DJD (degenerative joint disease)   . GERD (gastroesophageal reflux disease)   . Gout   . H/O hiatal hernia   . History of cardioversion    x3 (years uncertain)  . Hx of adenomatous colonic polyps   . Hyperlipidemia   . Hypertension   .  Hypertensive cardiomyopathy ([place])   . Obstructive sleep apnea    compliant with CPAP  . 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 Persistent atrial fibrillation ([place])    a. s/p MAZE 04/2013 in setting of CABG. b. Amio stopped in 10/2013 after flutter ablation.  [name] PFO (patent foramen ovale)    a. Small PFO by TEE 10/2013.  [name] Pleural effusion, left    a. s/p thoracentesis 05/2013.  [name] Respiratory failure ([place])    a. Hypoxia 10/2013 - required supp O2 as inpatient, did not require it at discharge.  . S/P Maze operation for atrial fibrillation    a. 04/2013: Complete bilateral atrial lesion set using cryothermy and bipolar radiofrequency ablation with clipping of LA appendage (@ time of CABG)  . S/P Maze operation for atrial fibrillation 04/05/2013   Complete bilateral atrial lesion set using cryothermy and bipolar radiofrequency ablation with clipping of LA appendage via median sternotomy approach     Immunization History  Administered Date(s) Administered  . DT (Pediatric) 08/05/2015  . Influenza Split 10/25/2012  . Influenza, High Dose Seasonal PF 09/26/2013, 09/27/2014, 10/03/2015, 10/05/2016, 11/11/2017, 10/10/2018  . PFIZER SARS-COV-2 Vaccination 01/26/2019, 02/15/2019  . Pneumococcal Conjugate-13 01/30/2014  . Pneumococcal Polysaccharide-23 10/20/2011  . Td 01/06/2000    Past Surgical History:  Procedure Laterality Date  . APPENDECTOMY  03/05/2017   laproscopic  . ATRIAL FIBRILLATION ABLATION N/A 10/26/2013     Procedure: ATRIAL FIBRILLATION ABLATION;  Surgeon: Coralyn Mark, MD;  Location: [place] CATH LAB;  Service: Cardiovascular;  Laterality: N/A;  . BASAL CELL CARCINOMA EXCISION     x3 on face  . CARDIAC CATHETERIZATION     myocardial bridge but no cad  . CARDIOVERSION N/A 08/23/2013   Procedure: CARDIOVERSION;  Surgeon: Sanda Klein, MD;  Location: [place];  Service: Cardiovascular;  Laterality: N/A;  . CARPOMETACARPEL SUSPENSION PLASTY Left 02/14/2014   Procedure: CARPOMETACARPEL ([place]) SUSPENSIONPLASTY THUMB  WITH  ABDUCTOR POLLICIS LONGUS TRANSFER AND STENOSING TENOSYNOVITIS RELEASE LEFT WRIST;  Surgeon: Charlotte Crumb, MD;  Location: [place];  Service: Orthopedics;  Laterality: Left;  . CATARACT EXTRACTION     bilateral  . COLONOSCOPY WITH PROPOFOL N/A 01/26/2017   Procedure: COLONOSCOPY WITH PROPOFOL;  Surgeon: Doran Stabler, MD;  Location: [facility] ENDOSCOPY;  Service: Gastroenterology;  Laterality: N/A;  . CORONARY ARTERY BYPASS GRAFT N/A 04/05/2013   Procedure: CORONARY ARTERY BYPASS GRAFTING (CABG) TIMES TWO USING LEFT INTERNAL MAMMARY ARTERY AND RIGHT SAPHENOUS LEG VEIN HARVESTED ENDOSCOPICALLY;  Surgeon: Rexene Alberts, MD;  Location: [place];  Service: Open Heart Surgery;  Laterality: N/A;  . ESOPHAGOGASTRODUODENOSCOPY (EGD) WITH PROPOFOL N/A 01/26/2017   Procedure: ESOPHAGOGASTRODUODENOSCOPY (EGD) WITH PROPOFOL;  Surgeon: Doran Stabler, MD;  Location: [facility];  Service: Gastroenterology;  Laterality: N/A;  . GREAT TOE ARTHRODESIS, INTERPHALANGEAL JOINT     Right foot  . INTRAOPERATIVE TRANSESOPHAGEAL ECHOCARDIOGRAM N/A 04/05/2013   Procedure: INTRAOPERATIVE TRANSESOPHAGEAL ECHOCARDIOGRAM;  Surgeon: Rexene Alberts, MD;  Location: [place];  Service: Open Heart Surgery;  Laterality: N/A;  . LAPAROSCOPIC APPENDECTOMY N/A 03/05/2017   Procedure: APPENDECTOMY LAPAROSCOPIC;  Surgeon: Judeth Horn, MD;  Location: [place];  Service: General;  Laterality: N/A;  . LEFT  HEART CATHETERIZATION WITH CORONARY ANGIOGRAM N/A 03/07/2013   Procedure: LEFT HEART CATHETERIZATION WITH CORONARY ANGIOGRAM;  Surgeon: Burnell Blanks, MD;  Location: [facility] C[facility] LAB;  Service: Cardiovascular;  Laterality:  N/A;  . MAZE N/A 04/05/2013   Procedure: MAZE;  Surgeon: Rexene Alberts, MD;  Location: [place];  Service: Open Heart Surgery;  Laterality: N/A;  . Polinydal cyst     Removed  . POLYPECTOMY    . Retina repair-right    . RIGHT HEART CATHETERIZATION N/A 05/03/2014   Procedure: RIGHT HEART CATH;  Surgeon: Jolaine Artist, MD;  Location: [facility] CATH LAB;  Service: Cardiovascular;  Laterality: N/A;  . TEE WITHOUT CARDIOVERSION N/A 08/23/2013   Procedure: TRANSESOPHAGEAL ECHOCARDIOGRAM (TEE);  Surgeon: Sanda Klein, MD;  [facility];  Service: Cardiovascular;  Laterality: N/A;  . TEE WITHOUT CARDIOVERSION N/A 10/26/2013   Procedure: TRANSESOPHAGEAL ECHOCARDIOGRAM (TEE);  Surgeon: Sueanne Margarita, MD;  Location: [facility] ENDOSCOPY;  Service: Cardiovascular;  Laterality: N/A;  . TEE WITHOUT CARDIOVERSION N/A 06/05/2014   Procedure: TRANSESOPHAGEAL ECHOCARDIOGRAM (TEE);  Surgeon: Thayer Headings, MD;  Location[facility];  Service: Cardiovascular;  Laterality: N/A;  . TRAPEZIUM RESECTION      FHx:    Reviewed / unchanged  SHx:    Reviewed / unchanged   Systems Review:  Constitutional: Denies fever, chills, wt changes, headaches, insomnia, fatigue, night sweats, change in appetite. Eyes: Denies redness, blurred vision, diplopia, discharge, itchy, watery eyes.  ENT: Denies discharge, congestion, post nasal drip, epistaxis, sore throat, earache, hearing loss, dental pain, tinnitus, vertigo, sinus pain, snoring.  CV: Denies chest pain, palpitations, irregular heartbeat, syncope, dyspnea, diaphoresis, orthopnea, PND, claudication or edema. Respiratory: denies cough, dyspnea, DOE, pleurisy, hoarseness, laryngitis, wheezing.  Gastrointestinal: Denies dysphagia, odynophagia,  heartburn, reflux, water brash, abdominal pain or cramps, nausea, vomiting, bloating, diarrhea, constipation, hematemesis, melena, hematochezia  or hemorrhoids. Genitourinary: Denies dysuria, frequency, urgency, nocturia, hesitancy, discharge, hematuria or flank pain. Musculoskeletal: Denies arthralgias, myalgias, stiffness, jt. swelling, pain, limping or strain/sprain.  Skin: Denies pruritus, rash, hives, warts, acne, eczema or change in skin lesion(s). Neuro: No weakness, tremor, incoordination, spasms, paresthesia or pain. Psychiatric: Denies confusion, memory loss or sensory loss. Endo: Denies change in weight, skin or hair change.  Heme/Lymph: No excessive bleeding, bruising or enlarged lymph nodes.  Physical Exam  BP 126/74   Pulse 60   Temp (!) 97.3 F (36.3 C)   Resp 16   Ht 5' 11" (1.803 m)   Wt 187 lb 9.6 oz (85.1 kg)   BMI 26.16 kg/m   Appears  well nourished, well groomed  and in no distress.  Eyes: PERRLA, EOMs, conjunctiva no swelling or erythema. Sinuses: No frontal/maxillary tenderness ENT/Mouth: EAC's clear, TM's nl w/o erythema, bulging. Nares clear w/o erythema, swelling, exudates. Oropharynx clear without erythema or exudates. Oral hygiene is good. Tongue normal, non obstructing. Hearing intact.  Neck: Supple. Thyroid not palpable. Car 2+/2+ without bruits, nodes or JVD. Chest: Respirations nl with BS clear & equal w/o rales, rhonchi, wheezing or stridor.  Cor: Heart sounds normal w/ regular rate and rhythm without sig. murmurs, gallops, clicks or rubs. Peripheral pulses normal and equal  without edema.  Abdomen: Soft & bowel sounds normal. Non-tender w/o guarding, rebound, hernias, masses or organomegaly.  Lymphatics: Unremarkable.  Musculoskeletal: Full ROM all peripheral extremities, joint stability, 5/5 strength and normal gait.  Skin: Warm, dry without exposed rashes, lesions or ecchymosis apparent.  Neuro: Cranial nerves intact, reflexes equal bilaterally.  Sensory-motor testing grossly intact. Tendon reflexes grossly intact.  Pysch: Alert & oriented x 3.  Insight and judgement nl & appropriate. No ideations.  Assessment and Plan:  1. Essential hypertension  - Continue medication,  monitor blood pressure at home.  - Continue DASH diet.  Reminder to go to the ER if any CP,  SOB, nausea, dizziness, severe HA, changes vision/speech.  - CBC with Differential/Platelet - COMPLETE METABOLIC PANEL WITH GFR - Magnesium - TSH  2. Hyperlipidemia associated with type 2 diabetes mellitus ([place])  - Continue diet/meds, exercise,& lifestyle modifications.  - Continue monitor periodic cholesterol/liver & renal functions   - Lipid panel - TSH  3. Type 2 diabetes mellitus with stage 4 chronic kidney disease,  with long-term current use of insulin (H[facility])  - Continue diet, exercise  - Lifestyle modifications.  - Monitor appropriate labs.  - Hemoglobin A1c  4. Vitamin D deficiency  - Continue supplementation.  - VITAMIN D 25 Hydroxy   5. Chronic diastolic congestive heart failure ([place])  6. PAF (paroxysmal atrial fibrillation) (H[facility])  - TSH - Protime-INR  7. Pulmonary hypertension ([place])  8. Chronic gouty arthropathy  - Uric acid  9. Gastroesophageal reflux disease   - CBC with Differential/Platelet  10. PFO (patent foramen ovale)  - Protime-INR  11. Secondary hyperparathyroidism  (HCC)  - COMPLETE METABOLIC PANEL WITH GFR  12. 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  - Lipid panel  13. Chronic anticoagulation  - Protime-INR  14. Encounter for monitoring Coumadin therapy  - Protime-INR  15. Acquired thrombophilia ([place])  - CBC with Differential/Platelet - Protime-INR  16. Medication management  - CBC with Differential/Platelet - COMPLETE METABOLIC PANEL WITH GFR - Magnesium - Lipid panel - TSH - Hemoglobin A1c - VITAMIN D 25 Hydroxy - Uric acid       Discussed  regular exercise, BP monitoring, weight  control to achieve/maintain BMI less than 25 and discussed med and SE's. Recommended labs to assess and monitor clinical status with further disposition pending results of labs.  I discussed the assessment and treatment plan with the patient. The patient was provided an opportunity to ask questions and all were answered. The patient agreed with the plan and demonstrated an understanding of the instructions.  I provided over 30 minutes of exam, counseling, chart review and  complex critical decision making.   Kirtland Bouchard, MD

## 2019-03-27 NOTE — Patient Instructions (Addendum)
Vit D  & Vit C 1,000 mg   are recommended to help protect  against the Covid-19 and other Corona viruses.    Also it's recommended  to take  Zinc 50 mg  to help  protect against the Covid-19   and best place to get  is also on [employer]  and don't pay more than 6-8 cents /pill !   ===================================== Coronavirus (COVID-19) Are you at risk?  Are you at risk for the Coronavirus (COVID-19)?  To be considered HIGH RISK for Coronavirus (COVID-19), you have to meet the following criteria:  . Traveled to [place], [place], [place], [place] or [place]; or in the [place] to [place], [place], [place]  . or [place]; and have fever, cough, and shortness of breath within the last 2 weeks of travel OR . Been in close contact with a person diagnosed with COVID-19 within the last 2 weeks and have  . fever, cough,and shortness of breath .  . IF YOU DO NOT MEET THESE CRITERIA, YOU ARE CONSIDERED LOW RISK FOR COVID-19.  What to do if you are HIGH RISK for COVID-19?  . If you are having a medical emergency, call 911. . Seek medical care right away. Before you go to a doctor's office, urgent care or emergency department, .  call ahead and tell them about your recent travel, contact with someone diagnosed with COVID-19  .  and your symptoms.  . You should receive instructions from your physician's office regarding next steps of care.  . When you arrive at healthcare provider, tell the healthcare staff immediately you have returned from  . visiting [place], [place], [place], [place] or [place]; or traveled in the [place] to [place], [place],  . [place] or [place] in the last two weeks or you have been in close contact with a person diagnosed with  . COVID-19 in the last 2 weeks.   . Tell the health care staff about your symptoms: fever, cough and shortness of breath. . After you have been seen by a medical provider, you will be either: o Tested for  (COVID-19) and discharged home on quarantine except to seek medical care if  o symptoms worsen, and asked to  - Stay home and avoid contact with others until you get your results (4-5 days)  - Avoid travel on public transportation if possible (such as bus, train, or airplane) or o Sent to the Emergency Department by EMS for evaluation, COVID-19 testing  and  o possible admission depending on your condition and test results.  What to do if you are LOW RISK for COVID-19?  Reduce your risk of any infection by using the same precautions used for avoiding the common cold or flu:  . Wash your hands often with soap and warm water for at least 20 seconds.  If soap and water are not readily available,  . use an alcohol-based hand sanitizer with at least 60% alcohol.  . If coughing or sneezing, cover your mouth and nose by coughing or sneezing into the elbow areas of your shirt or coat, .  into a tissue or into your sleeve (not your hands). . Avoid shaking hands with others and consider head nods or verbal greetings only. . Avoid touching your eyes, nose, or mouth with unwashed hands.  . Avoid close contact with people who are sick. . Avoid places or events with large numbers of people in one location, like concerts or sporting events. .   Carefully consider travel plans you have or are making. . If you are planning any travel outside or inside the [place], visit the CDC's Travelers' Health webpage for the latest health notices. . If you have some symptoms but not all symptoms, continue to monitor at home and seek medical attention  . if your symptoms worsen. . If you are having a medical emergency, call 911.   ++++++++++++++++++++++++++++++++ Recommend Adult Low Dose Aspirin or  coated  Aspirin 81 mg daily  To reduce risk of Colon Cancer 40 %,  Skin Cancer 26 % ,  Melanoma 46%  and  Pancreatic cancer 60% ++++++++++++++++++++++++++++++++ Vitamin D goal  is between 70-100.  Please make sure that  you are taking your Vitamin D as directed.  It is very important as a natural anti-inflammatory  helping hair, skin, and nails, as well as reducing stroke and heart attack risk.  It helps your bones and helps with mood. It also decreases numerous cancer risks so please take it as directed.  Low Vit D is associated with a 200-300% higher risk for CANCER  and 200-300% higher risk for HEART   ATTACK  &  STROKE.   ...................................... It is also associated with higher death rate at younger ages,  autoimmune diseases like Rheumatoid arthritis, Lupus, Multiple Sclerosis.    Also many other serious conditions, like depression, Alzheimer's Dementia, infertility, muscle aches, fatigue, fibromyalgia - just to name a few. ++++++++++++++++++++ Recommend the book "The END of DIETING" by Dr Joel Fuhrman  & the book "The END of DIABETES " by Dr Joel Fuhrman At [employer] - get book & Audio CD's    Being diabetic has a  300% increased risk for heart attack, stroke, cancer, and alzheimer- type vascular dementia. It is very important that you work harder with diet by avoiding all foods that are white. Avoid white rice (brown & wild rice is OK), white potatoes (sweetpotatoes in moderation is OK), White bread or wheat bread or anything made out of white flour like bagels, donuts, rolls, buns, biscuits, cakes, pastries, cookies, pizza crust, and pasta (made from white flour & egg whites) - vegetarian pasta or spinach or wheat pasta is OK. Multigrain breads like Arnold's or Pepperidge Farm, or multigrain sandwich thins or flatbreads.  Diet, exercise and weight loss can reverse and cure diabetes in the early stages.  Diet, exercise and weight loss is very important in the control and prevention of complications of diabetes which affects every system in your body, ie. Brain - dementia/stroke, eyes - glaucoma/blindness, heart - heart attack/heart failure, kidneys - dialysis, stomach - gastric paralysis,  intestines - malabsorption, nerves - severe painful neuritis, circulation - gangrene & loss of a leg(s), and finally cancer and Alzheimers.    I recommend avoid fried & greasy foods,  sweets/candy, white rice (brown or wild rice or Quinoa is OK), white potatoes (sweet potatoes are OK) - anything made from white flour - bagels, doughnuts, rolls, buns, biscuits,white and wheat breads, pizza crust and traditional pasta made of white flour & egg white(vegetarian pasta or spinach or wheat pasta is OK).  Multi-grain bread is OK - like multi-grain flat bread or sandwich thins. Avoid alcohol in excess. Exercise is also important.    Eat all the vegetables you want - avoid meat, especially red meat and dairy - especially cheese.  Cheese is the most concentrated form of trans-fats which is the worst thing to clog up our arteries. Veggie cheese is OK which can be   found in the fresh produce section at [employer] or [employer] or [employer]  +++++++++++++++++++++ DASH Eating Plan  DASH stands for "Dietary Approaches to Stop Hypertension."   The DASH eating plan is a healthy eating plan that has been shown to reduce high blood pressure (hypertension). Additional health benefits may include reducing the risk of type 2 diabetes mellitus, heart disease, and stroke. The DASH eating plan may also help with weight loss. WHAT DO I NEED TO KNOW ABOUT THE DASH EATING PLAN? For the DASH eating plan, you will follow these general guidelines:  Choose foods with a percent daily value for sodium of less than 5% (as listed on the food label).  Use salt-free seasonings or herbs instead of table salt or sea salt.  Check with your health care provider or pharmacist before using salt substitutes.  Eat lower-sodium products, often labeled as "lower sodium" or "no salt added."  Eat fresh foods.  Eat more vegetables, fruits, and low-fat dairy products.  Choose whole grains. Look for the word "whole" as the first word in  the ingredient list.  Choose fish   Limit sweets, desserts, sugars, and sugary drinks.  Choose heart-healthy fats.  Eat veggie cheese   Eat more home-cooked food and less restaurant, buffet, and fast food.  Limit fried foods.  Cook foods using methods other than frying.  Limit canned vegetables. If you do use them, rinse them well to decrease the sodium.  When eating at a restaurant, ask that your food be prepared with less salt, or no salt if possible.                      WHAT FOODS CAN I EAT? Read Dr Joel Fuhrman's books on The End of Dieting & The End of Diabetes  Grains Whole grain or whole wheat bread. Brown rice. Whole grain or whole wheat pasta. Quinoa, bulgur, and whole grain cereals. Low-sodium cereals. Corn or whole wheat flour tortillas. Whole grain cornbread. Whole grain crackers. Low-sodium crackers.  Vegetables Fresh or frozen vegetables (raw, steamed, roasted, or grilled). Low-sodium or reduced-sodium tomato and vegetable juices. Low-sodium or reduced-sodium tomato sauce and paste. Low-sodium or reduced-sodium canned vegetables.   Fruits All fresh, canned (in natural juice), or frozen fruits.  Protein Products  All fish and seafood.  Dried beans, peas, or lentils. Unsalted nuts and seeds. Unsalted canned beans.  Dairy Low-fat dairy products, such as skim or 1% milk, 2% or reduced-fat cheeses, low-fat ricotta or cottage cheese, or plain low-fat yogurt. Low-sodium or reduced-sodium cheeses.  Fats and Oils Tub margarines without trans fats. Light or reduced-fat mayonnaise and salad dressings (reduced sodium). Avocado. Safflower, olive, or canola oils. Natural peanut or almond butter.  Other Unsalted popcorn and pretzels. The items listed above may not be a complete list of recommended foods or beverages. Contact your dietitian for more options.  +++++++++++++++  WHAT FOODS ARE NOT RECOMMENDED? Grains/ White flour or wheat flour White bread. White pasta.  White rice. Refined cornbread. Bagels and croissants. Crackers that contain trans fat.  Vegetables  Creamed or fried vegetables. Vegetables in a . Regular canned vegetables. Regular canned tomato sauce and paste. Regular tomato and vegetable juices.  Fruits Dried fruits. Canned fruit in light or heavy syrup. Fruit juice.  Meat and Other Protein Products Meat in general - RED meat & White meat.  Fatty cuts of meat. Ribs, chicken wings, all processed meats as bacon, sausage, bologna, salami, fatback, hot dogs, bratwurst and packaged   luncheon meats.  Dairy Whole or 2% milk, cream, half-and-half, and cream cheese. Whole-fat or sweetened yogurt. Full-fat cheeses or blue cheese. Non-dairy creamers and whipped toppings. Processed cheese, cheese spreads, or cheese curds.  Condiments Onion and garlic salt, seasoned salt, table salt, and sea salt. Canned and packaged gravies. Worcestershire sauce. Tartar sauce. Barbecue sauce. Teriyaki sauce. Soy sauce, including reduced sodium. Steak sauce. Fish sauce. Oyster sauce. Cocktail sauce. Horseradish. Ketchup and mustard. Meat flavorings and tenderizers. Bouillon cubes. Hot sauce. Tabasco sauce. Marinades. Taco seasonings. Relishes.  Fats and Oils Butter, stick margarine, lard, shortening and bacon fat. Coconut, palm kernel, or palm oils. Regular salad dressings.  Pickles and olives. Salted popcorn and pretzels.  The items listed above may not be a complete list of foods and beverages to avoid.  Bleeding Precautions When on Anticoagulant Therapy  Anticoagulant therapy, also called blood thinner therapy, is medicine that helps to prevent and treat blood clots. The medicine works by stopping blood clots from forming or growing. Blood clots that form in your blood vessels can be dangerous. They can break loose and travel to the heart, lungs, or brain. This increases the risk of a heart attack, stroke, or blocked lung artery (pulmonary  embolism). Anticoagulants also increase the risk of bleeding. Try to protect yourself from cuts and other injuries that can cause bleeding. It is important to take anticoagulants exactly as told by your health care provider. Why do I need to be on anticoagulant therapy? You may need this medicine if you are at risk of developing a blood clot. Conditions that increase your risk of a blood clot include:  Being born with heart disease or a heart malformation (congenital heart disease).  Developing heart disease.  Having had surgery, such as valve replacement.  Having had a serious accident or other type of severe injury (trauma).  Having certain types of cancer.  Having certain diseases that can increase blood clotting.  Having a high risk of stroke or heart attack.  Having atrial fibrillation (AF). What are the common anticoagulant medicines? There are several types of anticoagulant medicines. The most common types are:  Medicines that you take by mouth (oral medicines), such as: ? Warfarin.   Injections, such as: ?  heparin. ?  These anticoagulants work in different ways to prevent blood clots. They also have different risks and side effects.  What do I need to remember while on anticoagulant therapy?  Taking anticoagulants   Take your medicine at the same time every day. If you forget to take your medicine, take it as soon as you remember. Do not double your dosage of medicine if you miss a whole day. Take your normal dose and call your health care provider.  Do not stop taking your medicine unless your health care provider approves. Stopping the medicine can increase your risk of developing a blood clot.   Taking other medicines   Take over-the-counter and prescriptions medicines only as told by your health care provider.  Do not take over-the-counter NSAIDs, including aspirin and ibuprofen, while you are on anticoagulant therapy. These medicines increase your risk of  dangerous bleeding.  Get approval from your health care provider before you start taking any new medicines, vitamins, or herbal products. Some of these could interfere with your therapy.   General instructions   Keep all follow-up visits as told by your health care provider. This is important.  If you are pregnant or trying to get pregnant, talk with a health care  provider about anticoagulants. Some of these medicines are not safe to take during pregnancy. Tell all health care providers, including your dentist, that you are on anticoagulant therapy. It is especially important to tell providers before you have any surgery, medical procedures, or dental work done.  What precautions should I take?   Be very careful when using knives, scissors, or other sharp objects.  Use an electric razor instead of a blade.  Do not use toothpicks.  Use a soft-bristled toothbrush. Brush your teeth gently.  Always wear shoes outdoors and wear slippers indoors.  Be careful when cutting your fingernails and toenails.  Place bath mats in the bathroom. If possible, install handrails as well.  Wear gloves while you do yard work.  Wear your seat belt.  Prevent falls by removing loose rugs and extension cords from areas where you walk. Use a cane or walker if you need it.  Avoid constipation by: ? Drinking enough fluid to keep your urine clear or pale yellow. ? Eating foods that are high in fiber, such as fresh fruits and vegetables, whole grains, and beans. ? Limiting foods that are high in fat and processed sugars, such as fried and sweet foods.  Do not play contact sports or participate in other activities that have a high risk for injury  . What other precautions are important if on warfarin therapy?  If you are taking a type of anticoagulant called warfarin, make sure you:  Work with a diet and nutrition specialist (dietitian) to make an eating plan. Do not make any sudden changes to your  diet after you have started your eating plan.  Do not drink alcohol. It can interfere with your medicine and increase your risk of an injury that causes bleeding.  Get regular blood tests as told by your health care provider.   What are some questions to ask my health care provider?   Why do I need anticoagulant therapy?  What is the best anticoagulant therapy for my condition?  How long will I need anticoagulant therapy?  What are the side effects of anticoagulant therapy?  When should I take my medicine? What should I do if I forget to take it?  Will I need to have regular blood tests?  Do I need to change my diet? Are there foods or drinks that I should avoid?  What activities are safe for me?  Contact a health care provider if:   You miss a dose of medicine: ? And you are not sure what to do. ? For more than one day.  You have: ? Bloody or brown urine. ? Easy bruising. ? Black and tarry stool or bright red stool. ? Side effects from your medicine.  You feel weak or dizzy.  Get help right away if:   You have bleeding that will not stop within 20 minutes from: ? The nose. ? The gums. ? A cut on the skin.  You have a severe headache or stomachache.  You vomit or cough up blood. You fall or hit your head.  Summary   Anticoagulant therapy, also called blood thinner therapy, is medicine that helps to prevent and treat blood clots.    Anticoagulants work in different ways to prevent blood clots. They also have different risks and side effects.    Talk with your health care provider about any precautions that you should take while on anticoagulant therapy. [name]

## 2019-03-28 ENCOUNTER — Encounter: Payer: Self-pay | Admitting: Internal Medicine

## 2019-03-28 ENCOUNTER — Other Ambulatory Visit: Payer: Self-pay

## 2019-03-28 ENCOUNTER — Ambulatory Visit (INDEPENDENT_AMBULATORY_CARE_PROVIDER_SITE_OTHER): Payer: Medicare Other | Admitting: Internal Medicine

## 2019-03-28 VITALS — BP 126/74 | HR 60 | Temp 97.3°F | Resp 16 | Ht 71.0 in | Wt 187.6 lb

## 2019-03-28 DIAGNOSIS — E1122 Type 2 diabetes mellitus with diabetic chronic kidney disease: Secondary | ICD-10-CM

## 2019-03-28 DIAGNOSIS — Z5181 Encounter for therapeutic drug level monitoring: Secondary | ICD-10-CM

## 2019-03-28 DIAGNOSIS — E559 Vitamin D deficiency, unspecified: Secondary | ICD-10-CM

## 2019-03-28 DIAGNOSIS — N2581 Secondary hyperparathyroidism of renal origin: Secondary | ICD-10-CM | POA: Diagnosis not present

## 2019-03-28 DIAGNOSIS — E785 Hyperlipidemia, unspecified: Secondary | ICD-10-CM

## 2019-03-28 DIAGNOSIS — K2100 Gastro-esophageal reflux disease with esophagitis, without bleeding: Secondary | ICD-10-CM

## 2019-03-28 DIAGNOSIS — Z79899 Other long term (current) drug therapy: Secondary | ICD-10-CM

## 2019-03-28 DIAGNOSIS — Z794 Long term (current) use of insulin: Secondary | ICD-10-CM

## 2019-03-28 DIAGNOSIS — N184 Chronic kidney disease, stage 4 (severe): Secondary | ICD-10-CM

## 2019-03-28 DIAGNOSIS — E1169 Type 2 diabetes mellitus with other specified complication: Secondary | ICD-10-CM | POA: Diagnosis not present

## 2019-03-28 DIAGNOSIS — Z951 Presence of aortocoronary bypass graft: Secondary | ICD-10-CM

## 2019-03-28 DIAGNOSIS — I5032 Chronic diastolic (congestive) heart failure: Secondary | ICD-10-CM | POA: Diagnosis not present

## 2019-03-28 DIAGNOSIS — M1A00X0 Idiopathic chronic gout, unspecified site, without tophus (tophi): Secondary | ICD-10-CM

## 2019-03-28 DIAGNOSIS — I10 Essential (primary) hypertension: Secondary | ICD-10-CM

## 2019-03-28 DIAGNOSIS — I480 Paroxysmal atrial fibrillation: Secondary | ICD-10-CM

## 2019-03-28 DIAGNOSIS — Q2112 Patent foramen ovale: Secondary | ICD-10-CM

## 2019-03-28 DIAGNOSIS — Q211 Atrial septal defect: Secondary | ICD-10-CM

## 2019-03-28 DIAGNOSIS — D6869 Other thrombophilia: Secondary | ICD-10-CM

## 2019-03-28 DIAGNOSIS — Z7901 Long term (current) use of anticoagulants: Secondary | ICD-10-CM

## 2019-03-28 DIAGNOSIS — I272 Pulmonary hypertension, unspecified: Secondary | ICD-10-CM

## 2019-03-29 LAB — LIPID PANEL
Cholesterol: 163 mg/dL (ref <200)
HDL: 31 mg/dL — ABNORMAL LOW (ref >=40)
LDL Cholesterol (Calc): 95 mg/dL (calc)
Non-HDL Cholesterol (Calc): 132 mg/dL (calc) — ABNORMAL HIGH (ref <130)
Total CHOL/HDL Ratio: 5.3 (calc) — ABNORMAL HIGH (ref <5.0)
Triglycerides: 257 mg/dL — ABNORMAL HIGH (ref <150)

## 2019-03-29 LAB — CBC WITH DIFFERENTIAL/PLATELET
Absolute Monocytes: 593 cells/uL (ref 200–950)
Basophils Absolute: 30 cells/uL (ref 0–200)
Basophils Relative: 0.4 %
Eosinophils Absolute: 60 cells/uL (ref 15–500)
Eosinophils Relative: 0.8 %
HCT: 40.5 % (ref 38.5–50.0)
Hemoglobin: 13.1 g/dL — ABNORMAL LOW (ref 13.2–17.1)
Lymphs Abs: 833 cells/uL — ABNORMAL LOW (ref 850–3900)
MCH: 29.7 pg (ref 27.0–33.0)
MCHC: 32.3 g/dL (ref 32.0–36.0)
MCV: 91.8 fL (ref 80.0–100.0)
MPV: 8.8 fL (ref 7.5–12.5)
Monocytes Relative: 7.9 %
Neutro Abs: 5985 cells/uL (ref 1500–7800)
Neutrophils Relative %: 79.8 %
Platelets: 236 10*3/uL (ref 140–400)
RBC: 4.41 10*6/uL (ref 4.20–5.80)
RDW: 14.2 % (ref 11.0–15.0)
Total Lymphocyte: 11.1 %
WBC: 7.5 10*3/uL (ref 3.8–10.8)

## 2019-03-29 LAB — HEMOGLOBIN A1C
Hgb A1c MFr Bld: 7.4 % of total Hgb — ABNORMAL HIGH (ref <5.7)
Mean Plasma Glucose: 166 (calc)
eAG (mmol/L): 9.2 (calc)

## 2019-03-29 LAB — COMPLETE METABOLIC PANEL WITH GFR
AG Ratio: 1.6 (calc) (ref 1.0–2.5)
ALT: 17 U/L (ref 9–46)
AST: 18 U/L (ref 10–35)
Albumin: 4.5 g/dL (ref 3.6–5.1)
Alkaline phosphatase (APISO): 122 U/L (ref 35–144)
BUN/Creatinine Ratio: 27 (calc) — ABNORMAL HIGH (ref 6–22)
BUN: 58 mg/dL — ABNORMAL HIGH (ref 7–25)
CO2: 31 mmol/L (ref 20–32)
Calcium: 9.7 mg/dL (ref 8.6–10.3)
Chloride: 96 mmol/L — ABNORMAL LOW (ref 98–110)
Creat: 2.18 mg/dL — ABNORMAL HIGH (ref 0.70–1.18)
GFR, Est African American: 32 mL/min/{1.73_m2} — ABNORMAL LOW (ref >=60)
GFR, Est Non African American: 28 mL/min/{1.73_m2} — ABNORMAL LOW (ref >=60)
Globulin: 2.9 g/dL (calc) (ref 1.9–3.7)
Glucose, Bld: 240 mg/dL — ABNORMAL HIGH (ref 65–99)
Potassium: 4.1 mmol/L (ref 3.5–5.3)
Sodium: 139 mmol/L (ref 135–146)
Total Bilirubin: 0.8 mg/dL (ref 0.2–1.2)
Total Protein: 7.4 g/dL (ref 6.1–8.1)

## 2019-03-29 LAB — PROTIME-INR
INR: 2.5 — ABNORMAL HIGH
Prothrombin Time: 24.8 s — ABNORMAL HIGH (ref 9.0–11.5)

## 2019-03-29 LAB — URIC ACID: Uric Acid, Serum: 8.8 mg/dL — ABNORMAL HIGH (ref 4.0–8.0)

## 2019-03-29 LAB — VITAMIN D 25 HYDROXY (VIT D DEFICIENCY, FRACTURES): Vit D, 25-Hydroxy: 53 ng/mL (ref 30–100)

## 2019-03-29 LAB — TSH: TSH: 1.69 mIU/L (ref 0.40–4.50)

## 2019-03-29 LAB — MAGNESIUM: Magnesium: 2.6 mg/dL — ABNORMAL HIGH (ref 1.5–2.5)

## 2019-04-01 ENCOUNTER — Other Ambulatory Visit (HOSPITAL_COMMUNITY): Payer: Self-pay | Admitting: Internal Medicine

## 2019-04-21 ENCOUNTER — Encounter (INDEPENDENT_AMBULATORY_CARE_PROVIDER_SITE_OTHER): Payer: Medicare Other | Admitting: Ophthalmology

## 2019-04-21 DIAGNOSIS — H35033 Hypertensive retinopathy, bilateral: Secondary | ICD-10-CM | POA: Diagnosis not present

## 2019-04-21 DIAGNOSIS — H353111 Nonexudative age-related macular degeneration, right eye, early dry stage: Secondary | ICD-10-CM | POA: Diagnosis not present

## 2019-04-21 DIAGNOSIS — I10 Essential (primary) hypertension: Secondary | ICD-10-CM | POA: Diagnosis not present

## 2019-04-21 DIAGNOSIS — H34832 Tributary (branch) retinal vein occlusion, left eye, with macular edema: Secondary | ICD-10-CM | POA: Diagnosis not present

## 2019-04-21 DIAGNOSIS — H338 Other retinal detachments: Secondary | ICD-10-CM | POA: Diagnosis not present

## 2019-04-21 DIAGNOSIS — H43813 Vitreous degeneration, bilateral: Secondary | ICD-10-CM

## 2019-04-27 ENCOUNTER — Encounter (HOSPITAL_COMMUNITY): Payer: Self-pay | Admitting: Internal Medicine

## 2019-04-27 ENCOUNTER — Other Ambulatory Visit: Payer: Self-pay

## 2019-04-27 ENCOUNTER — Ambulatory Visit (HOSPITAL_COMMUNITY)
Admission: RE | Admit: 2019-04-27 | Discharge: 2019-04-27 | Disposition: A | Discharge status: Home / Self Care | Payer: Medicare Other | Source: Ambulatory Visit | Attending: Internal Medicine | Admitting: Internal Medicine

## 2019-04-27 VITALS — BP 114/56 | HR 63 | Wt 189.4 lb

## 2019-04-27 DIAGNOSIS — Z7982 Long term (current) use of aspirin: Secondary | ICD-10-CM | POA: Insufficient documentation

## 2019-04-27 DIAGNOSIS — G4733 Obstructive sleep apnea (adult) (pediatric): Secondary | ICD-10-CM | POA: Insufficient documentation

## 2019-04-27 DIAGNOSIS — I4510 Unspecified right bundle-branch block: Secondary | ICD-10-CM | POA: Insufficient documentation

## 2019-04-27 DIAGNOSIS — E1122 Type 2 diabetes mellitus with diabetic chronic kidney disease: Secondary | ICD-10-CM | POA: Diagnosis not present

## 2019-04-27 DIAGNOSIS — Z794 Long term (current) use of insulin: Secondary | ICD-10-CM | POA: Diagnosis not present

## 2019-04-27 DIAGNOSIS — I2510 Atherosclerotic heart disease of native coronary artery without angina pectoris: Secondary | ICD-10-CM

## 2019-04-27 DIAGNOSIS — I5032 Chronic diastolic (congestive) heart failure: Secondary | ICD-10-CM | POA: Diagnosis not present

## 2019-04-27 DIAGNOSIS — Z79899 Other long term (current) drug therapy: Secondary | ICD-10-CM | POA: Insufficient documentation

## 2019-04-27 DIAGNOSIS — E785 Hyperlipidemia, unspecified: Secondary | ICD-10-CM | POA: Diagnosis not present

## 2019-04-27 DIAGNOSIS — M1990 Unspecified osteoarthritis, unspecified site: Secondary | ICD-10-CM | POA: Insufficient documentation

## 2019-04-27 DIAGNOSIS — F419 Anxiety disorder, unspecified: Secondary | ICD-10-CM | POA: Insufficient documentation

## 2019-04-27 DIAGNOSIS — I130 Hypertensive heart and chronic kidney disease with heart failure and stage 1 through stage 4 chronic kidney disease, or unspecified chronic kidney disease: Secondary | ICD-10-CM | POA: Diagnosis not present

## 2019-04-27 DIAGNOSIS — J449 Chronic obstructive pulmonary disease, unspecified: Secondary | ICD-10-CM | POA: Diagnosis not present

## 2019-04-27 DIAGNOSIS — Z7901 Long term (current) use of anticoagulants: Secondary | ICD-10-CM | POA: Diagnosis not present

## 2019-04-27 DIAGNOSIS — F329 Major depressive disorder, single episode, unspecified: Secondary | ICD-10-CM | POA: Diagnosis not present

## 2019-04-27 DIAGNOSIS — I480 Paroxysmal atrial fibrillation: Secondary | ICD-10-CM | POA: Insufficient documentation

## 2019-04-27 DIAGNOSIS — D649 Anemia, unspecified: Secondary | ICD-10-CM | POA: Insufficient documentation

## 2019-04-27 DIAGNOSIS — Q211 Atrial septal defect: Secondary | ICD-10-CM | POA: Diagnosis not present

## 2019-04-27 DIAGNOSIS — N1832 Chronic kidney disease, stage 3b: Secondary | ICD-10-CM

## 2019-04-27 DIAGNOSIS — M109 Gout, unspecified: Secondary | ICD-10-CM | POA: Insufficient documentation

## 2019-04-27 DIAGNOSIS — I272 Pulmonary hypertension, unspecified: Secondary | ICD-10-CM | POA: Diagnosis not present

## 2019-04-27 DIAGNOSIS — Z951 Presence of aortocoronary bypass graft: Secondary | ICD-10-CM | POA: Diagnosis not present

## 2019-04-27 DIAGNOSIS — Z8249 Family history of ischemic heart disease and other diseases of the circulatory system: Secondary | ICD-10-CM | POA: Insufficient documentation

## 2019-04-27 DIAGNOSIS — N184 Chronic kidney disease, stage 4 (severe): Secondary | ICD-10-CM | POA: Insufficient documentation

## 2019-04-27 DIAGNOSIS — K219 Gastro-esophageal reflux disease without esophagitis: Secondary | ICD-10-CM | POA: Diagnosis not present

## 2019-04-27 LAB — CBC
HCT: 35.3 % — ABNORMAL LOW (ref 39.0–52.0)
Hemoglobin: 10.7 g/dL — ABNORMAL LOW (ref 13.0–17.0)
MCH: 28.9 pg (ref 26.0–34.0)
MCHC: 30.3 g/dL (ref 30.0–36.0)
MCV: 95.4 fL (ref 80.0–100.0)
Platelets: 228 10*3/uL (ref 150–400)
RBC: 3.7 MIL/uL — ABNORMAL LOW (ref 4.22–5.81)
RDW: 14.8 % (ref 11.5–15.5)
WBC: 8.1 10*3/uL (ref 4.0–10.5)
nRBC: 0 % (ref 0.0–0.2)

## 2019-04-27 LAB — COMPREHENSIVE METABOLIC PANEL
ALT: 22 U/L (ref 0–44)
AST: 21 U/L (ref 15–41)
Albumin: 3.8 g/dL (ref 3.5–5.0)
Alkaline Phosphatase: 90 U/L (ref 38–126)
Anion gap: 10 (ref 5–15)
BUN: 37 mg/dL — ABNORMAL HIGH (ref 8–23)
CO2: 28 mmol/L (ref 22–32)
Calcium: 8.8 mg/dL — ABNORMAL LOW (ref 8.9–10.3)
Chloride: 103 mmol/L (ref 98–111)
Creatinine, Ser: 2.18 mg/dL — ABNORMAL HIGH (ref 0.61–1.24)
GFR calc Af Amer: 32 mL/min — ABNORMAL LOW (ref >60)
GFR calc non Af Amer: 28 mL/min — ABNORMAL LOW (ref >60)
Glucose, Bld: 239 mg/dL — ABNORMAL HIGH (ref 70–99)
Potassium: 4.3 mmol/L (ref 3.5–5.1)
Sodium: 141 mmol/L (ref 135–145)
Total Bilirubin: 0.8 mg/dL (ref 0.3–1.2)
Total Protein: 6.7 g/dL (ref 6.5–8.1)

## 2019-04-27 LAB — BRAIN NATRIURETIC PEPTIDE: B Natriuretic Peptide: 375.2 pg/mL — ABNORMAL HIGH (ref 0.0–100.0)

## 2019-04-27 NOTE — Patient Instructions (Signed)
Labs done today, your results will be available in MyChart, we will contact you for abnormal readings.  Your physician recommends that you schedule a follow-up appointment in: 2 months  If you have any questions or concerns before your next appointment please send [place] a message through [place] or call our office at [phone].  At the [facility], you and your health needs are our priority. As part of our continuing mission to provide you with exceptional heart care, we have created designated Provider Care Teams. These Care Teams include your primary Cardiologist (physician) and Advanced Practice Providers (APPs- Physician Assistants and Nurse Practitioners) who all work together to provide you with the care you need, when you need it.   You may see any of the following providers on your designated Care Team at your next follow up: [name] Dr Glori Bickers . Dr Loralie Champagne . Darrick Grinder, NP . Lyda Jester, PA . Audry Riles, PharmD   Please be sure to bring in all your medications bottles to every appointment.

## 2019-04-27 NOTE — Progress Notes (Signed)
ReDS Vest / Clip - 04/27/19 1000      ReDS Vest / Clip   Station Marker  D    Ruler Value  35    ReDS Value Range  Low volume    ReDS Actual Value  36    Anatomical Comments  sitting

## 2019-04-27 NOTE — Progress Notes (Addendum)
Assessment and Plan:   PAF (paroxysmal atrial fibrillation) (HCC) Check INR and will adjust medication according to labs. - off of coumadin due to acute anemia and concern for possible GI bleed; restart/follow up pending workup results Discussed if patient falls to immediately contact office or go to ER. Discussed foods that can increase or decrease Coumadin levels. Patient understands to call the office before starting a new medication.  Chronic diastolic congestive heart failure ([place]) Encouraged daily monitoring of the patient's weight, call office if 5 lb weight loss or gain in a day.  Encouraged regular exercise. If any increasing shortness of breath, swelling, or chest pressure go to ER immediately.  decrease your fluid intake to less than 1.5 L daily please remember to always increase your potassium intake with any increase of your fluid pill.   Vitamin D deficiency Continue supplement  Essential hypertension - continue medications, DASH diet, exercise and monitor at home. Call if greater than 130/80.  -     CBC with Differential/Platelet  Senile purpura ([place]) Discussed process, protect skin, sunscreen  CKD stage 3 due to type 2 diabetes mellitus ([place]) Monitor  Pulmonary hypertension ([place]) Continue follow up  Chronic anticoagulation -     Protime-INR  Acute anemia/ hx of iron def anemia Had workup by Dr. [name] in 01/2017, had no issues until recently noted acute anemia by cardiology labs, notably had stopped iron supplement in Jan 2021 due to normal iron panel Neg hemoccult in office today, DRE unremarkable; will send home hemoccult x 3 as well Check CBC, iron today; if hemoccult x 3 at home are negative will anticipate restarting iron If positive will refer back to GI   Further disposition pending results of labs. Discussed med's effects and SE's.   Over 30 minutes of exam, counseling, chart review, and critical decision making was performed.   Future Appointments   Date Time Provider [place]  07/03/2019 12:20 PM Bensimhon, Shaune Pascal, MD [facility] None  07/04/2019  3:30 PM Vicie Mutters, PA-C [facility] None  09/01/2019 12:30 PM Hayden Pedro, MD [facility] None  10/11/2019 10:00 AM [name], MD [facility] None    ------------------------------------------------------------------------------------------------------------------   HPI BP 118/70   Pulse 68   Temp (!) 97.5 F (36.4 C)   Ht 5' 11"  (1.803 m)   Wt 185 lb (83.9 kg)   SpO2 97%   BMI 25.80 kg/m   79 y.o.male presents for follow up on coumadin for p. A. fib, CHF, htn, CKD III/IV.  His blood pressure has been controlled at home, today their BP is BP: 118/70  He does not workout. He denies chest pain, shortness of breath, dizziness.  He has a history of Diastolic CHF, denies dyspnea on exertion, orthopnea, paroxysmal nocturnal dyspnea and edema. Positive for scant edema.  Follows with Dr. Missy Sabins, at home he is at goal weight of 180. He is taking torsemide 100 mg in AM and 50 mg PM, and does metolazone 5 mg every 7-10 days. Wt Readings from Last 3 Encounters:  05/01/19 185 lb (83.9 kg)  04/27/19 189 lb 6.4 oz (85.9 kg)  03/28/19 187 lb 9.6 oz (85.1 kg)   Patient is on Coumadin for PAF (paroxysmal atrial fibrillation) ([place]) [I48.0] Patient's last INR is  Lab Results  Component Value Date   INR 2.5 (H) 03/28/2019   INR 2.4 (H) 02/22/2019   INR 1.5 (H) 02/02/2019    Patient denies SOB, CP, dizziness, and blood in stool/urine.  His coumadin dose was  not changed last visit. He has not taken ABX, has not missed any doses. Denies falls.   Current dose: 2 tabs daily 5 days a week and 1 tab 2 days a week, however ---  Acute hgb/hct drop was noted on recent labs by cardiology who contacted [place] with concern. The was called on Friday 4/23, denied sx of GI bleed (blood in stool, black/tarry stools, fatigue, etc), advised to hold coumadin over the weekend, ER precautions given.    He denies any notable sx over the weekend. Has been holding coumadin as directed.   He had similar acute onset anemia 01/2017 with iron deficiency, was referred to GI Dr. 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 Injected. Treated with argon plasma coagulation (APC). Clip (MR conditional) was placed at that time. Colonoscopy showed polyp in appendix, was unable to resect so underwent appendectomy, had non-bleeding diverticula as well. Had remained on iron supplement which he reported caused constipation but otherwise had done well since. Notably previous iron supplement was also discontinued Jan 2021 after recheck iron test was normal.   CBC EXTENDED Latest Ref Rng & Units 04/27/2019 03/28/2019 02/22/2019  WBC 4.0 - 10.5 K/uL 8.1 7.5 11.5(H)  RBC 4.22 - 5.81 MIL/uL 3.70(L) 4.41 4.47  HGB 13.0 - 17.0 g/dL 10.7(L) 13.1(L) 13.8  HCT 39.0 - 52.0 % 35.3(L) 40.5 41.8  PLT 150 - 400 K/uL 228 236 287  NEUTROABS 1,500 - 7,800 cells/uL - 5,985 9,108(H)  LYMPHSABS 850 - 3,900 cells/uL - 833(L) 1,208   Lab Results  Component Value Date   IRON 124 01/19/2019   TIBC 390 01/19/2019   FERRITIN 106 01/19/2019   Lab Results  Component Value Date   GFRNONAA 28 (L) 04/27/2019      Past Medical History:  Diagnosis Date  . Adrenal adenoma   . Anemia   . Anxiety   . Arthritis   . Atypical atrial flutter ([place]) 8/15, 10/15   a. DCCV 08/2013. b. s/p RFA 10/2013.  [name] Basal cell carcinoma   . CAD (coronary artery disease)    a. 04/2013 CABG x 2: LIMA to LAD, SVG to RI, EVH via R thigh.  . Cellulitis 12/2015   left leg  . Chronic diastolic congestive heart failure ([place])   . CKD (chronic kidney disease), stage III   . Depression   . Diabetes mellitus type II   . Diverticulosis 2001  . DJD (degenerative joint disease)   . GERD (gastroesophageal reflux disease)   . Gout   . H/O hiatal hernia   . History of cardioversion    x3 (years uncertain)  . Hx of  adenomatous colonic polyps   . Hyperlipidemia   . Hypertension   . Hypertensive cardiomyopathy ([place])   . Obstructive sleep apnea    compliant with CPAP  . 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 Persistent atrial fibrillation ([place])    a. s/p MAZE 04/2013 in setting of CABG. b. Amio stopped in 10/2013 after flutter ablation.  [name] PFO (patent foramen ovale)    a. Small PFO by TEE 10/2013.  [name] Pleural effusion, left    a. s/p thoracentesis 05/2013.  [name] Respiratory failure ([place])    a. Hypoxia 10/2013 - required supp O2 as inpatient, did not require it at discharge.  . S/P Maze operation for atrial fibrillation    a. 04/2013: Complete  bilateral atrial lesion set using cryothermy and bipolar radiofrequency ablation with clipping of LA appendage (@ time of CABG)  . S/P Maze operation for atrial fibrillation 04/05/2013   Complete bilateral atrial lesion set using cryothermy and bipolar radiofrequency ablation with clipping of LA appendage via median sternotomy approach      Allergies  Allergen Reactions  . Sunflower Seed [Sunflower Oil] Swelling and Other (See Comments)    Tongue and lip swelling  . Horse-Derived Products Other (See Comments)    Per allergy skin test UNSPECIFIED REACTION   . Tetanus Toxoids Other (See Comments)    Per allergy skin test  . Tetanus Toxoid     Other reaction(s): Other (See Comments) Rash(horse serum)    Current Outpatient Medications on File Prior to Visit  Medication Sig  . acetaminophen (TYLENOL) 500 MG tablet Take 1,000 mg by mouth every 6 (six) hours as needed for moderate pain or headache.   . allopurinol (ZYLOPRIM) 300 MG tablet Take 300 mg by mouth daily.  [name] ALPRAZolam (XANAX) 1 MG tablet Take 0.5 tablets (0.5 mg total) by mouth at bedtime as needed for sleep.  [name] Alum Hydroxide-Mag Carbonate (GAVISCON PO) Take 4 tablets by mouth daily as needed  (acid reflux).   [name] aspirin EC 81 MG tablet Take 81 mg by mouth daily.  [name] atorvastatin (LIPITOR) 20 MG tablet Take 1 tablet 3 x /week  . azelastine (ASTELIN) 0.1 % nasal spray Place 2 sprays into both nostrils 2 (two) times daily as needed for rhinitis or allergies.  . B Complex Vitamins (VITAMIN B COMPLEX PO) Take 1 tablet by mouth at bedtime.   . bacitracin 500 UNIT/GM ointment Apply 1 application topically daily as needed for wound care.  . benzonatate (TESSALON) 200 MG capsule Take 1 capsule (200 mg total) by mouth 3 (three) times daily as needed for cough (Max: 680m per day).  . Blood Glucose Monitoring Suppl (ACCU-CHEK AVIVA PLUS) w/Device KIT Check blood sugar 1 time  daily  . Blood Glucose Monitoring Suppl DEVI Test blood sugar up to three times a day or as directed.  . carvedilol (COREG) 6.25 MG tablet Take 1/2 tablet by mouth every morning and afternoon then take 1 tablet by mouth every evening.  . Cholecalciferol (VITAMIN D PO) Take 5,000 Units by mouth daily.   . colchicine 0.6 MG tablet Take 0.6 mg by mouth daily as needed (for gout flare up).   . Continuous Blood Gluc Receiver (FREESTYLE LIBRE 14 DAY READER) DEVI 1 Device by Does not apply route 3 (three) times daily. Dx: E11.22, N18.4, Z79.4  . Continuous Blood Gluc Sensor (FREESTYLE LIBRE 14 DAY SENSOR) MISC Check blood sugar 3 times a day. Dx: E11.22, N18.4, Z79.4  . diphenhydrAMINE (BENADRYL) 25 MG tablet Take 25 mg by mouth at bedtime as needed for allergies.   . fluticasone (FLONASE) 50 MCG/ACT nasal spray Place 1 spray into both nostrils daily as needed for allergies.  .Marland Kitchenglucose blood (ACCU-CHEK AVIVA PLUS) test strip CHECK BLOOD GLUCOSE 3 TIMES DAILY.  .Marland Kitcheninsulin NPH-regular Human (70-30) 100 UNIT/ML injection Inject 50 units into the skin subcutaneous every morning and inject 30units into the skin subcutaneous every evening  . lidocaine (LIDODERM) 5 % Place 1 patch onto the skin daily as needed (for pain). Remove & Discard  patch within 12 hours or as directed by MD   . loratadine (CLARITIN) 10 MG tablet Take 10 mg by mouth daily as needed for allergies.   . metolazone (ZAROXOLYN)  2.5 MG tablet Take 2.5 mg by mouth once a week.  . Multiple Vitamins-Minerals (PRESERVISION AREDS 2) CAPS Take 1 capsule by mouth 2 (two) times daily.   . polyvinyl alcohol (ARTIFICIAL TEARS) 1.4 % ophthalmic solution Place 1 drop into both eyes daily as needed for dry eyes.  . potassium chloride SA (KLOR-CON) 20 MEQ tablet Take 2 tablets 3 x /day for potassium  . sertraline (ZOLOFT) 100 MG tablet Take 1 tablet Daily for Mood  . torsemide (DEMADEX) 100 MG tablet Take 150 mg by mouth daily.  [name] warfarin (COUMADIN) 5 MG tablet Take 1 to 2 tablets Daily to Prevent Blood Clots (Patient not taking: Reported on 05/01/2019)   No current facility-administered medications on file prior to visit.    ROS: Review of Systems  Constitutional: Negative for malaise/fatigue and weight loss.  HENT: Negative for congestion, hearing loss and tinnitus.   Eyes: Negative for blurred vision and double vision.  Respiratory: Negative for cough, sputum production, shortness of breath and wheezing.   Cardiovascular: Negative for chest pain, palpitations, orthopnea, claudication, leg swelling and PND.  Gastrointestinal: Negative for abdominal pain, blood in stool, constipation, diarrhea, heartburn, melena, nausea and vomiting.  Genitourinary: Negative.   Musculoskeletal: Negative for joint pain and myalgias.  Skin: Negative for rash.  Neurological: Negative for dizziness, tingling, sensory change, weakness and headaches.  Endo/Heme/Allergies: Negative for polydipsia. Bruises/bleeds easily.  Psychiatric/Behavioral: Negative.   All other systems reviewed and are negative.    Physical Exam:  BP 118/70   Pulse 68   Temp (!) 97.5 F (36.4 C)   Ht 5' 11"  (1.803 m)   Wt 185 lb (83.9 kg)   SpO2 97%   BMI 25.80 kg/m   General Appearance: Well nourished, in  no apparent distress. Eyes: PERRLA, EOMs, conjunctiva no swelling or erythema Sinuses: No Frontal/maxillary tenderness ENT/Mouth: Ext aud canals clear, TMs without erythema, bulging. No erythema, swelling, or exudate on post pharynx.  Tonsils not swollen or erythematous. Hearing normal.  Neck: Supple, thyroid normal.  Respiratory: Respiratory effort normal, BS equal bilaterally without rales, rhonchi, wheezing or stridor.  Cardio: RRR with no MRGs. Brisk peripheral pulses with scant non-pitting edema   Abdomen: Soft, obese/mildly distended, + BS.  Non tender, no guarding, rebound, hernias, masses. Lymphatics: Non tender without lymphadenopathy.  Musculoskeletal: Full ROM, 5/5 strength, normal gait.  Skin: Warm, dry without rashes, lesions; he has fragile skin and numerous small ecchymoses to bilateral upper extremities. Top of head with erythematous scaly nodules.  Neuro: Cranial nerves intact. Normal muscle tone, no cerebellar symptoms. Sensation intact.  Psych: Awake and oriented X 3, normal affect, Insight and Judgment appropriate.  Rectal exam: negative without mass, lesions or tenderness, sphincter tone normal, stool guaiac negative, exam chaperoned by Chancy Hurter, CMA.     Izora Ribas, NP 2:38 PM [facility]

## 2019-04-27 NOTE — Progress Notes (Signed)
[facility] Note .  Date:  04/27/2019   ID:  [name]., DOB 04-16-1940, MRN [id number]  Location: Home  Provider location: [facility] Type of Visit: Established patient  PCP:  Cory Pinto, MD  Cardiologist:  No primary care provider on file. Primary HF: [name]  Chief Complaint: Heart Failure follow-up   History of Present Illness:  [name] is an 79 y/o male with COPD , DM, PAF, 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, CKD, AFL s/p ablation in 10/15. 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 Had surgery in 4/15. Did well for a while went to cardiac rehab and was feeling fine.  Developed AFL in 10/15 and underwent RFA.   In 3/16 began to develop severe SOB. Started O2. Says his symptoms got worse almost overnight. 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 He has seen Dr. Roxy [name] but felt to have no optimal surgical options for repair. His case was also presented to Dr. Michaelle [name] at [facility] who agreed that there was no way to baffle or reroute the anomalous PV flow to the LA. He had a TEE which showed LVEF 60-65% with a dilated right side and a small PFO. He has also been seen by Dr. Lake [name] who performed PFTs that showed significant restrictive lung disease with a low DLCO. He had f/u with Dr. Gilles [name] in the [facility] who felt his symptoms were multifactorial.  Had colonoscopy in 2019 and found to have a polyp and what sounds like AVMs. Subsequently had a laparoscopic removal of appendiceal adenoma which was benign.   Recently struggling with progressive SOB. I felt he was volume overloaded. Torsemide changed to 100/50 and prn metolazone added.   Here for routine f/u. Taking torsemide now 110m in am (instead of 100/50) so he doesn't pee as much at night. Takes metolazone 57mevery Thursday.  Still with NYHA III symptoms with DOE and easy fatiguability. No CP. Says he gains 5 pounds per week and then loses it after metoalzone so weight relatively stable.   Cardiac studies:  Echo 02/20/19 LVEF 60-65% RV markedly dilated. Mod HK. RVSP 60.7 mmHg. Personally reviewed  Echo 12/18 LVEF 60% RV dilated mildly HK   PFTs (7/16) FEV1 1.45 L (45%) FVC 1.77 L (40%) DLCO 46%  RHC 4/16 RA = 18 RV = 72/4/17 PA = 76/27 (46) PCW = 21 Fick cardiac output/index (using PA sat) = 9.2/4.45 Thermo CO/CI = 10.0/4.87 PVR = 2.2 WU Fick cardiac output/index (using high SVC sat) = 5.2/2.5 Pulse-ox saturation = 89%  High SVC sat = 54% Low SVC sat = 81% (at SVC/RA junction) RA sat = 68% RV sat = 66% PA sat = 68%, 69% IVC sat =56%   VQ/CT negative for PE   TEE 10/15 small PFO  Ab u/s 6/16 liver normal + ascites. Medico renal kidney disease.     HeWinchesterenies symptoms worrisome for COVID 19.   Past Medical History:  Diagnosis Date  . Adrenal adenoma   . Anemia   . Anxiety   . Arthritis   . Atypical atrial flutter (HCSaline8/15, 10/15   a. DCCV 08/2013. b. s/p RFA 10/2013.  . Marland Kitchenasal cell carcinoma   . CAD (coronary artery disease)    a. 04/2013 CABG x  2: LIMA to LAD, SVG to RI, EVH via R thigh.  . Cellulitis 12/2015   left leg  . Chronic diastolic congestive heart failure ([place])   . CKD (chronic kidney disease), stage III   . Depression   . Diabetes mellitus type II   . Diverticulosis 2001  . DJD (degenerative joint disease)   . GERD (gastroesophageal reflux disease)   . Gout   . H/O hiatal hernia   . History of cardioversion    x3 (years uncertain)  . Hx of adenomatous colonic polyps   . Hyperlipidemia   . Hypertension   . Hypertensive cardiomyopathy ([place])   . Obstructive sleep apnea    compliant with CPAP  . 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 Persistent atrial fibrillation ([place])    a. s/p MAZE 04/2013 in setting of CABG. b. Amio stopped in 10/2013 after flutter ablation.  [name] PFO (patent foramen ovale)    a. Small PFO by TEE 10/2013.  [name] Pleural effusion, left    a. s/p thoracentesis 05/2013.  [name] Respiratory failure ([place])    a. Hypoxia 10/2013 - required supp O2 as inpatient, did not require it at discharge.  . S/P Maze operation for atrial fibrillation    a. 04/2013: Complete bilateral atrial lesion set using cryothermy and bipolar radiofrequency ablation with clipping of LA appendage (@ time of CABG)  . S/P Maze operation for atrial fibrillation 04/05/2013   Complete bilateral atrial lesion set using cryothermy and bipolar radiofrequency ablation with clipping of LA appendage via median sternotomy approach    Past Surgical History:  Procedure Laterality Date  . APPENDECTOMY  03/05/2017   laproscopic  . ATRIAL FIBRILLATION ABLATION N/A 10/26/2013   Procedure: ATRIAL FIBRILLATION ABLATION;  Surgeon: Cory Mark, MD;  Location: [facility];  Service: Cardiovascular;  Laterality: N/A;  . BASAL CELL CARCINOMA EXCISION     x3 on face  . CARDIAC CATHETERIZATION     myocardial bridge but no cad  . CARDIOVERSION N/A 08/23/2013   Procedure: CARDIOVERSION;  Surgeon: Cory Klein, MD;  Location: [place];  Service: Cardiovascular;  Laterality: N/A;  . CARPOMETACARPEL SUSPENSION PLASTY Left 02/14/2014   Procedure: CARPOMETACARPEL ([place]) SUSPENSIONPLASTY THUMB  WITH  ABDUCTOR POLLICIS LONGUS TRANSFER AND STENOSING TENOSYNOVITIS RELEASE LEFT WRIST;  Surgeon: Cory Crumb, MD;  Location: [facility];  Service: Orthopedics;  Laterality: Left;  . CATARACT EXTRACTION     bilateral  . COLONOSCOPY WITH PROPOFOL N/A 01/26/2017   Procedure: COLONOSCOPY WITH PROPOFOL;  Surgeon: Cory Stabler, MD;  Location: [facility];  Service: Gastroenterology;  Laterality: N/A;  . CORONARY ARTERY BYPASS GRAFT N/A 04/05/2013    Procedure: CORONARY ARTERY BYPASS GRAFTING (CABG) TIMES TWO USING LEFT INTERNAL MAMMARY ARTERY AND RIGHT SAPHENOUS LEG VEIN HARVESTED ENDOSCOPICALLY;  Surgeon: Rexene Alberts, MD;  Location: [facility];  Service: Open Heart Surgery;  Laterality: N/A;  . ESOPHAGOGASTRODUODENOSCOPY (EGD) WITH PROPOFOL N/A 01/26/2017   Procedure: ESOPHAGOGASTRODUODENOSCOPY (EGD) WITH PROPOFOL;  Surgeon: Cory Stabler, MD;  Location: [facility];  Service: Gastroenterology;  Laterality: N/A;  . GREAT TOE ARTHRODESIS, INTERPHALANGEAL JOINT     Right foot  . INTRAOPERATIVE TRANSESOPHAGEAL ECHOCARDIOGRAM N/A 04/05/2013   Procedure: INTRAOPERATIVE TRANSESOPHAGEAL ECHOCARDIOGRAM;  Surgeon: Rexene Alberts, MD;  Location: [facility];  Service: Open Heart Surgery;  Laterality: N/A;  . LAPAROSCOPIC APPENDECTOMY N/A 03/05/2017   Procedure: APPENDECTOMY LAPAROSCOPIC;  Surgeon: Judeth Horn, MD;  Location: [place];  Service: General;  Laterality: N/A;  . LEFT HEART CATHETERIZATION WITH CORONARY ANGIOGRAM N/A 03/07/2013   Procedure: LEFT HEART CATHETERIZATION WITH CORONARY ANGIOGRAM;  Surgeon: Burnell Blanks, MD;  Location: [facility] CATH LAB;  Service: Cardiovascular;  Laterality: N/A;  . MAZE N/A 04/05/2013   Procedure: MAZE;  Surgeon: Rexene Alberts, MD;  Location: [place];  Service: Open Heart Surgery;  Laterality: N/A;  . Polinydal cyst     Removed  . POLYPECTOMY    . Retina repair-right    . RIGHT HEART CATHETERIZATION N/A 05/03/2014   Procedure: RIGHT HEART CATH;  Surgeon: Jolaine Artist, MD;  Location: [facility] CATH LAB;  Service: Cardiovascular;  Laterality: N/A;  . TEE WITHOUT CARDIOVERSION N/A 08/23/2013   Procedure: TRANSESOPHAGEAL ECHOCARDIOGRAM (TEE);  Surgeon: Cory Klein, MD;  Location: [facility] ENDOSCOPY;  Service: Cardiovascular;  Laterality: N/A;  . TEE WITHOUT CARDIOVERSION N/A 10/26/2013   Procedure: TRANSESOPHAGEAL ECHOCARDIOGRAM (TEE);  Surgeon: Sueanne Margarita, MD;  Location: [facility] ENDOSCOPY;  Service: Cardiovascular;   Laterality: N/A;  . TEE WITHOUT CARDIOVERSION N/A 06/05/2014   Procedure: TRANSESOPHAGEAL ECHOCARDIOGRAM (TEE);  Surgeon: Thayer Headings, MD;  Location: [place];  Service: Cardiovascular;  Laterality: N/A;  . TRAPEZIUM RESECTION       Current Outpatient Medications  Medication Sig Dispense Refill  . acetaminophen (TYLENOL) 500 MG tablet Take 1,000 mg by mouth every 6 (six) hours as needed for moderate pain or headache.     . allopurinol (ZYLOPRIM) 300 MG tablet Take 300 mg by mouth daily.    [name] ALPRAZolam (XANAX) 1 MG tablet Take 0.5 tablets (0.5 mg total) by mouth at bedtime as needed for sleep. 60 tablet 0  . Alum Hydroxide-Mag Carbonate (GAVISCON PO) Take 4 tablets by mouth daily as needed (acid reflux).     [name] aspirin EC 81 MG tablet Take 81 mg by mouth daily.    [name] atorvastatin (LIPITOR) 20 MG tablet Take 1 tablet 3 x /week 36 tablet 3  . azelastine (ASTELIN) 0.1 % nasal spray Place 2 sprays into both nostrils 2 (two) times daily as needed for rhinitis or allergies.    . B Complex Vitamins (VITAMIN B COMPLEX PO) Take 1 tablet by mouth at bedtime.     . bacitracin 500 UNIT/GM ointment Apply 1 application topically daily as needed for wound care.    . benzonatate (TESSALON) 200 MG capsule Take 1 capsule (200 mg total) by mouth 3 (three) times daily as needed for cough (Max: 643m per day). 30 capsule 0  . Blood Glucose Monitoring Suppl (ACCU-CHEK AVIVA PLUS) w/Device KIT Check blood sugar 1 time  daily 1 kit 0  . Blood Glucose Monitoring Suppl DEVI Test blood sugar up to three times a day or as directed. 1 each 0  . carvedilol (COREG) 6.25 MG tablet Take 1/2 tablet by mouth every morning and afternoon then take 1 tablet by mouth every evening.    . Cholecalciferol (VITAMIN D PO) Take 5,000 Units by mouth daily.     . colchicine 0.6 MG tablet Take 0.6 mg by mouth daily as needed (for gout flare up).     . Continuous Blood Gluc Receiver (FREESTYLE LIBRE 14 DAY READER) DEVI 1 Device by Does  not apply route 3 (three) times daily. Dx: E11.22, N18.4, Z79.4 1 each 0  . Continuous Blood Gluc Sensor (FREESTYLE LIBRE 14 DAY SENSOR) MISC Check blood sugar 3 times a day. Dx: E11.22, N18.4, Z79.4  2 each 12  . diphenhydrAMINE (BENADRYL) 25 MG tablet Take 25 mg by mouth at bedtime as needed for allergies.     . fluticasone (FLONASE) 50 MCG/ACT nasal spray Place 1 spray into both nostrils daily as needed for allergies. 16 g 3  . glucose blood (ACCU-CHEK AVIVA PLUS) test strip CHECK BLOOD GLUCOSE 3 TIMES DAILY. 300 strip 3  . insulin NPH-regular Human (70-30) 100 UNIT/ML injection Inject 50 units into the skin subcutaneous every morning and inject 30units into the skin subcutaneous every evening    . lidocaine (LIDODERM) 5 % Place 1 patch onto the skin daily as needed (for pain). Remove & Discard patch within 12 hours or as directed by MD     . loratadine (CLARITIN) 10 MG tablet Take 10 mg by mouth daily as needed for allergies.     . metolazone (ZAROXOLYN) 2.5 MG tablet Take 2.5 mg by mouth once a week.    . Multiple Vitamins-Minerals (PRESERVISION AREDS 2) CAPS Take 1 capsule by mouth 2 (two) times daily.     . polyvinyl alcohol (ARTIFICIAL TEARS) 1.4 % ophthalmic solution Place 1 drop into both eyes daily as needed for dry eyes.    . potassium chloride SA (KLOR-CON) 20 MEQ tablet Take 2 tablets 3 x /day for potassium 540 tablet 3  . sertraline (ZOLOFT) 100 MG tablet Take 1 tablet Daily for Mood 90 tablet 3  . torsemide (DEMADEX) 100 MG tablet Take 150 mg by mouth daily.    [name] warfarin (COUMADIN) 5 MG tablet Take 1 to 2 tablets Daily to Prevent Blood Clots 180 tablet 3   No current facility-administered medications for this encounter.    Allergies:   Sunflower seed [sunflower oil], Horse-derived products, Tetanus toxoids, and Tetanus toxoid   Social History:  The patient  reports that he quit smoking about 38 years ago. His smoking use included cigarettes. He has a 100.00 pack-year smoking  history. He has never used smokeless tobacco. He reports current alcohol use of about 1.0 standard drinks of alcohol per week. He reports that he does not use drugs.   Family History:  The patient's family history includes Atrial fibrillation in his mother; Colon cancer in his mother; Colon polyps in his mother and sister; Dementia in his father; Diabetes in his maternal uncle; Hypertension in his mother; Stroke in his paternal uncle.   Vitals:   04/27/19 0950  BP: (!) 114/56  Pulse: 63  SpO2: 96%  Weight: 85.9 kg (189 lb 6.4 oz)     Exam:  General:  Elderly. No resp difficulty HEENT: normal Neck: supple. JVP 6-7  Carotids 2+ bilat; no bruits. No lymphadenopathy or thryomegaly appreciated. Cor: PMI nondisplaced. Regular rate & rhythm. 3/6 TR Lungs: clear Abdomen: soft, nontender, nondistended. No hepatosplenomegaly. No bruits or masses. Good bowel sounds. Extremities: no cyanosis, clubbing, rash, edema Neuro: alert & orientedx3, cranial nerves grossly intact. moves all 4 extremities w/o difficulty. Affect pleasant   Recent Labs: 02/20/2019: B Natriuretic Peptide 355.7 03/28/2019: ALT 17; BUN 58; Creat 2.18; Hemoglobin 13.1; Magnesium 2.6; Platelets 236; Potassium 4.1; Sodium 139; TSH 1.69  Personally reviewed   Wt Readings from Last 3 Encounters:  04/27/19 85.9 kg (189 lb 6.4 oz)  03/28/19 85.1 kg (187 lb 9.6 oz)  02/22/19 86.2 kg (190 lb)      ASSESSMENT AND PLAN:  1. Chronic diastolic HF with R>>L symptoms - Remains NYHA III - Volume status looks pretty good. ReDS 36% (before metolazone dose today)  -  Continue torsemide 127m daily with metolazone 560monce a week - Will try to be more active. If no improvement will need to repeat RHC.  - Echo 02/20/19 LVEF 60-65% RV markedly dilated. Mod HK. RVSP 60.7 mmHg. Personally reviewed  2. RV failure & Pulmonary HTN - Has large left to right shunt through large anomalous pulmonary vein into SVC - [facility] or baff[facility]. Continue medical therapy. - As above if symptoms persist will need repeat RHC - Echo 02/20/19 LVEF 60-65% RV markedly dilated. Mod HK. RVSP 60.7 mmHg.  3. CAD s/p CABG - No s/s ischemia - Continue statin. Off ASA due to warfarin  4. AF s/p Maze  - s/p ablation 10/15  - remains in NSR today.   - On coumadin. Has been on DOAC in past and didn't like it because of bleeding  - No recent bleeding. Check labs today.   5. CKD, stage 3-4, baseline creatinine 1.7-1.8 - More recently creatinine up to 2.18 - Repeat today  6. Anemia - hgb 13.1 on last check - will repeat today to see if this may be contributing to his fatigue.   7. RBBB - Stable     Signed, DaGlori BickersMD  04/27/2019 10:11 AM  [facility] [address] [phone]) [phone])

## 2019-04-28 ENCOUNTER — Encounter (HOSPITAL_COMMUNITY): Payer: Self-pay | Admitting: *Deleted

## 2019-05-01 ENCOUNTER — Other Ambulatory Visit (HOSPITAL_COMMUNITY): Payer: Self-pay | Admitting: Internal Medicine

## 2019-05-01 ENCOUNTER — Other Ambulatory Visit: Payer: Self-pay

## 2019-05-01 ENCOUNTER — Encounter: Payer: Self-pay | Admitting: Adult Health

## 2019-05-01 ENCOUNTER — Other Ambulatory Visit: Payer: Self-pay | Admitting: Internal Medicine

## 2019-05-01 ENCOUNTER — Ambulatory Visit (INDEPENDENT_AMBULATORY_CARE_PROVIDER_SITE_OTHER): Payer: Medicare Other | Admitting: Adult Health

## 2019-05-01 VITALS — BP 118/70 | HR 68 | Temp 97.5°F | Ht 71.0 in | Wt 185.0 lb

## 2019-05-01 DIAGNOSIS — D649 Anemia, unspecified: Secondary | ICD-10-CM

## 2019-05-01 DIAGNOSIS — Z7901 Long term (current) use of anticoagulants: Secondary | ICD-10-CM | POA: Diagnosis not present

## 2019-05-01 DIAGNOSIS — Z5181 Encounter for therapeutic drug level monitoring: Secondary | ICD-10-CM | POA: Diagnosis not present

## 2019-05-01 DIAGNOSIS — I480 Paroxysmal atrial fibrillation: Secondary | ICD-10-CM | POA: Diagnosis not present

## 2019-05-01 DIAGNOSIS — D509 Iron deficiency anemia, unspecified: Secondary | ICD-10-CM | POA: Diagnosis not present

## 2019-05-01 DIAGNOSIS — I5032 Chronic diastolic (congestive) heart failure: Secondary | ICD-10-CM

## 2019-05-01 DIAGNOSIS — D692 Other nonthrombocytopenic purpura: Secondary | ICD-10-CM | POA: Diagnosis not present

## 2019-05-01 LAB — POC HEMOCCULT BLD/STL (OFFICE/1-CARD/DIAGNOSTIC): Fecal Occult Blood, POC: NEGATIVE

## 2019-05-01 NOTE — Patient Instructions (Signed)
Please complete home hemoccult x 3 asap and drop off or mail back to office     Anemia  Anemia is a condition in which you do not have enough red blood cells or hemoglobin. Hemoglobin is a substance in red blood cells that carries oxygen. When you do not have enough red blood cells or hemoglobin (are anemic), your body cannot get enough oxygen and your organs may not work properly. As a result, you may feel very tired or have other problems. What are the causes? Common causes of anemia include:  Excessive bleeding. Anemia can be caused by excessive bleeding inside or outside the body, including bleeding from the intestine or from periods in women.  Poor nutrition.  Long-lasting (chronic) kidney, thyroid, and liver disease.  Bone marrow disorders.  Cancer and treatments for cancer.  HIV (human immunodeficiency virus) and AIDS (acquired immunodeficiency syndrome).  Treatments for HIV and AIDS.  Spleen problems.  Blood disorders.  Infections, medicines, and autoimmune disorders that destroy red blood cells. What are the signs or symptoms? Symptoms of this condition include:  Minor weakness.  Dizziness.  Headache.  Feeling heartbeats that are irregular or faster than normal (palpitations).  Shortness of breath, especially with exercise.  Paleness.  Cold sensitivity.  Indigestion.  Nausea.  Difficulty sleeping.  Difficulty concentrating. Symptoms may occur suddenly or develop slowly. If your anemia is mild, you may not have symptoms. How is this diagnosed? This condition is diagnosed based on:  Blood tests.  Your medical history.  A physical exam.  Bone marrow biopsy. Your health care provider may also check your stool (feces) for blood and may do additional testing to look for the cause of your bleeding. You may also have other tests, including:  Imaging tests, such as a CT scan or MRI.  Endoscopy.  Colonoscopy. How is this treated? Treatment for  this condition depends on the cause. If you continue to lose a lot of blood, you may need to be treated at a hospital. Treatment may include:  Taking supplements of iron, vitamin K09, or folic acid.  Taking a hormone medicine (erythropoietin) that can help to stimulate red blood cell growth.  Having a blood transfusion. This may be needed if you lose a lot of blood.  Making changes to your diet.  Having surgery to remove your spleen. Follow these instructions at home:  Take over-the-counter and prescription medicines only as told by your health care provider.  Take supplements only as told by your health care provider.  Follow any diet instructions that you were given.  Keep all follow-up visits as told by your health care provider. This is important. Contact a health care provider if:  You develop new bleeding anywhere in the body. Get help right away if:  You are very weak.  You are short of breath.  You have pain in your abdomen or chest.  You are dizzy or feel faint.  You have trouble concentrating.  You have bloody or black, tarry stools.  You vomit repeatedly or you vomit up blood. Summary  Anemia is a condition in which you do not have enough red blood cells or enough of a substance in your red blood cells that carries oxygen (hemoglobin).  Symptoms may occur suddenly or develop slowly.  If your anemia is mild, you may not have symptoms.  This condition is diagnosed with blood tests as well as a medical history and physical exam. Other tests may be needed.  Treatment for this condition  depends on the cause of the anemia. This information is not intended to replace advice given to you by your health care provider. Make sure you discuss any questions you have with your health care provider. Document Revised: 12/04/2016 Document Reviewed: 01/24/2016 Elsevier Patient Education  [place].

## 2019-05-02 LAB — CBC WITH DIFFERENTIAL/PLATELET
Absolute Monocytes: 842 cells/uL (ref 200–950)
Basophils Absolute: 31 cells/uL (ref 0–200)
Basophils Relative: 0.3 %
Eosinophils Absolute: 94 cells/uL (ref 15–500)
Eosinophils Relative: 0.9 %
HCT: 35.4 % — ABNORMAL LOW (ref 38.5–50.0)
Hemoglobin: 11.5 g/dL — ABNORMAL LOW (ref 13.2–17.1)
Lymphs Abs: 988 cells/uL (ref 850–3900)
MCH: 28.6 pg (ref 27.0–33.0)
MCHC: 32.5 g/dL (ref 32.0–36.0)
MCV: 88.1 fL (ref 80.0–100.0)
MPV: 8.9 fL (ref 7.5–12.5)
Monocytes Relative: 8.1 %
Neutro Abs: 8445 cells/uL — ABNORMAL HIGH (ref 1500–7800)
Neutrophils Relative %: 81.2 %
Platelets: 250 10*3/uL (ref 140–400)
RBC: 4.02 10*6/uL — ABNORMAL LOW (ref 4.20–5.80)
RDW: 14 % (ref 11.0–15.0)
Total Lymphocyte: 9.5 %
WBC: 10.4 10*3/uL (ref 3.8–10.8)

## 2019-05-02 LAB — IRON,TIBC AND FERRITIN PANEL
%SAT: 22 % (calc) (ref 20–48)
Ferritin: 41 ng/mL (ref 24–380)
Iron: 99 ug/dL (ref 50–180)
TIBC: 454 mcg/dL (calc) — ABNORMAL HIGH (ref 250–425)

## 2019-05-02 LAB — PROTIME-INR
INR: 1.4 — ABNORMAL HIGH
Prothrombin Time: 14.4 s — ABNORMAL HIGH (ref 9.0–11.5)

## 2019-05-11 ENCOUNTER — Other Ambulatory Visit: Payer: Self-pay | Admitting: *Deleted

## 2019-05-11 DIAGNOSIS — D649 Anemia, unspecified: Secondary | ICD-10-CM

## 2019-05-11 DIAGNOSIS — Z1212 Encounter for screening for malignant neoplasm of rectum: Secondary | ICD-10-CM | POA: Diagnosis not present

## 2019-05-11 DIAGNOSIS — Z1211 Encounter for screening for malignant neoplasm of colon: Secondary | ICD-10-CM | POA: Diagnosis not present

## 2019-05-11 LAB — POC HEMOCCULT BLD/STL (HOME/3-CARD/SCREEN)
Card #2 Fecal Occult Blod, POC: NEGATIVE
Card #3 Fecal Occult Blood, POC: NEGATIVE
Fecal Occult Blood, POC: NEGATIVE

## 2019-06-02 ENCOUNTER — Telehealth (HOSPITAL_COMMUNITY): Payer: Self-pay | Admitting: *Deleted

## 2019-06-02 DIAGNOSIS — I5032 Chronic diastolic (congestive) heart failure: Secondary | ICD-10-CM

## 2019-06-02 DIAGNOSIS — I10 Essential (primary) hypertension: Secondary | ICD-10-CM

## 2019-06-02 NOTE — Telephone Encounter (Signed)
Pt called to report dizziness, he states it has been going on for a while, he states it usually occurs after sitting for a while and then getting up. He states it doesn't last long and then passes, during the time though he often feels like he may pass out, his knees are buckling and he has to lean on something or sit down. He states his BP is running 102-110/50-60s which his PCP feels is too low for him. He states wt is up to 184.2 today (was 178.4 Mon) so he took his Metolazone as prescribed, he does not feel that has made the dizziness worse. He states he cut his Carvedilol back from 6.25 mg BID to 3.125 mg in AM and noon and 6.25 mg in PM but that does not have seemed to help either. Advised pt to decrease Carvedilol to just 3.125 mg in AM and 6.25 mg in PM for now. I will let Dr [name] know and call pt back next week.

## 2019-06-06 MED ORDER — CARVEDILOL 6.25 MG PO TABS: ORAL_TABLET | ORAL | 1 refills | Status: DC

## 2019-06-06 NOTE — Telephone Encounter (Signed)
Patient aware-will continue coreg 3.125 mg/6.25 Repeat labs 6/7

## 2019-06-06 NOTE — Telephone Encounter (Signed)
Agree with plan. Would check CBC and BMET

## 2019-06-06 NOTE — Telephone Encounter (Signed)
Left message to call back  

## 2019-06-12 ENCOUNTER — Other Ambulatory Visit: Payer: Self-pay

## 2019-06-12 ENCOUNTER — Ambulatory Visit (HOSPITAL_COMMUNITY)
Admission: RE | Admit: 2019-06-12 | Discharge: 2019-06-12 | Disposition: A | Discharge status: Home / Self Care | Payer: Medicare Other | Source: Ambulatory Visit | Attending: Internal Medicine | Admitting: Internal Medicine

## 2019-06-12 DIAGNOSIS — I5032 Chronic diastolic (congestive) heart failure: Secondary | ICD-10-CM | POA: Diagnosis not present

## 2019-06-12 LAB — CBC
HCT: 30 % — ABNORMAL LOW (ref 39.0–52.0)
Hemoglobin: 8.8 g/dL — ABNORMAL LOW (ref 13.0–17.0)
MCH: 26.6 pg (ref 26.0–34.0)
MCHC: 29.3 g/dL — ABNORMAL LOW (ref 30.0–36.0)
MCV: 90.6 fL (ref 80.0–100.0)
Platelets: 289 10*3/uL (ref 150–400)
RBC: 3.31 MIL/uL — ABNORMAL LOW (ref 4.22–5.81)
RDW: 15.9 % — ABNORMAL HIGH (ref 11.5–15.5)
WBC: 9.8 10*3/uL (ref 4.0–10.5)
nRBC: 0 % (ref 0.0–0.2)

## 2019-06-12 LAB — BASIC METABOLIC PANEL
Anion gap: 14 (ref 5–15)
BUN: 32 mg/dL — ABNORMAL HIGH (ref 8–23)
CO2: 25 mmol/L (ref 22–32)
Calcium: 8.8 mg/dL — ABNORMAL LOW (ref 8.9–10.3)
Chloride: 97 mmol/L — ABNORMAL LOW (ref 98–111)
Creatinine, Ser: 1.95 mg/dL — ABNORMAL HIGH (ref 0.61–1.24)
GFR calc Af Amer: 37 mL/min — ABNORMAL LOW (ref >60)
GFR calc non Af Amer: 32 mL/min — ABNORMAL LOW (ref >60)
Glucose, Bld: 142 mg/dL — ABNORMAL HIGH (ref 70–99)
Potassium: 3.7 mmol/L (ref 3.5–5.1)
Sodium: 136 mmol/L (ref 135–145)

## 2019-06-15 ENCOUNTER — Ambulatory Visit (INDEPENDENT_AMBULATORY_CARE_PROVIDER_SITE_OTHER): Payer: Medicare Other | Admitting: Adult Health

## 2019-06-15 ENCOUNTER — Telehealth: Payer: Self-pay | Admitting: *Deleted

## 2019-06-15 ENCOUNTER — Other Ambulatory Visit: Payer: Self-pay

## 2019-06-15 ENCOUNTER — Encounter: Payer: Self-pay | Admitting: Adult Health

## 2019-06-15 ENCOUNTER — Other Ambulatory Visit: Payer: Self-pay | Admitting: Internal Medicine

## 2019-06-15 VITALS — BP 102/58 | HR 77 | Temp 97.7°F | Wt 184.0 lb

## 2019-06-15 DIAGNOSIS — I480 Paroxysmal atrial fibrillation: Secondary | ICD-10-CM

## 2019-06-15 DIAGNOSIS — Z7901 Long term (current) use of anticoagulants: Secondary | ICD-10-CM | POA: Diagnosis not present

## 2019-06-15 DIAGNOSIS — D692 Other nonthrombocytopenic purpura: Secondary | ICD-10-CM

## 2019-06-15 DIAGNOSIS — R42 Dizziness and giddiness: Secondary | ICD-10-CM | POA: Diagnosis not present

## 2019-06-15 DIAGNOSIS — D509 Iron deficiency anemia, unspecified: Secondary | ICD-10-CM

## 2019-06-15 DIAGNOSIS — I10 Essential (primary) hypertension: Secondary | ICD-10-CM

## 2019-06-15 DIAGNOSIS — I5032 Chronic diastolic (congestive) heart failure: Secondary | ICD-10-CM

## 2019-06-15 DIAGNOSIS — Z5181 Encounter for therapeutic drug level monitoring: Secondary | ICD-10-CM | POA: Diagnosis not present

## 2019-06-15 DIAGNOSIS — Z87891 Personal history of nicotine dependence: Secondary | ICD-10-CM

## 2019-06-15 NOTE — Progress Notes (Signed)
Assessment and Plan:   PAF (paroxysmal atrial fibrillation) (HCC) Check INR and will adjust medication according to labs.  Discussed if patient falls to immediately contact office or go to ER. Discussed foods that can increase or decrease Coumadin levels. Patient understands to call the office before starting a new medication.  Chronic diastolic congestive heart failure ([place]) Encouraged daily monitoring of the patient's weight, call office if 5 lb weight loss or gain in a day.  Encouraged regular exercise. If any increasing shortness of breath, swelling, or chest pressure go to ER immediately.  decrease your fluid intake to less than 1.5 L daily please remember to always increase your potassium intake with any increase of your fluid pill.   Vitamin D deficiency Continue supplement  Essential hypertension - continue medications, DASH diet, exercise and monitor at home.   Senile purpura (HCC) Discussed process, protect skin, sunscreen  CKD stage 3 due to type 2 diabetes mellitus (HCC) Monitor - CMP/GFR  Chronic anticoagulation -     Protime-INR  Acute anemia/ hx of iron def anemia Had workup by Dr. 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 Injected. Treated with argon plasma coagulation (APC) Had been on iron supplement, stopped in Jan 2021 Had no issues until recently noted acute anemia by cardiology labs,  Neg hemoccult in office, DRE unremarkable; hemoccult x 3 at home was negative as well, iron was only mildly low Check CBC - plan to refer to hematology pending labs, above workup doesn't seem to suggest recurrence of GI bleed etiology  Also with advanced CKD -   Dizziness  ? multifactoral - hypotensive due to aggressive diuresis required with advanced CHF managed by Dr. Carlena Hurl; orthostatics are normal today Does have significant anemia; recently hgb 8.8 by cardiology - will recheck for trend - plan to refer to hematology for urgent management if  declined further, did restart iron supplement today On exam dizziness is best elicited by neck extension - former smoker - will check carotid dopplers Go to the ER if any chest pain, worsening shortness of breath, nausea, dizziness, severe HA, changes vision/speech, LOC/syncope   Further disposition pending results of labs. Discussed med's effects and SE's.   Over 30 minutes of exam, counseling, chart review, and critical decision making was performed.   Future Appointments  Date Time Provider [place]  07/03/2019 12:20 PM Bensimhon, Shaune Pascal, MD [facility] None  07/04/2019  3:30 PM Vicie Mutters, PA-C [facility] None  09/01/2019 12:30 PM Hayden Pedro, MD [facility] None  10/11/2019 10:00 AM [name], MD [facility] None    ------------------------------------------------------------------------------------------------------------------   HPI BP (!) 102/58    Pulse 77    Temp 97.7 F (36.5 C)    Wt 184 lb (83.5 kg)    SpO2 97%    BMI 25.66 kg/m   79 y.o.male presents for follow up on coumadin for p. A. fib, CHF, htn, CKD III/IV and acute visit for dizziness.   HE reports gradual onset of dizziness, noted when sitting to standing and twisting at the same time, denies with just sitting, turning his head, neck extension, rolling over in bed. Denies having consistently with sitting standing, typically happens when he stands up from recliner - will make it 10-12 paces away then suddenly feel as though he is spinning. He endorses dyspnea, has significantly at baseline (CHF, PH, follows closely with Dr. Missy Sabins) but feels this is worse in the last 2-3 months. Endorses worse fatigue in last few months. Has  checked sugars and hasn't correlated to dizzy episodes.   Cardiology is treating carvedilol 6.25 mg - takes 1/2 tab BID due to hypotension  He is taking torsemide 150 mg,  and does metolazone 5 mg every 7-10 days (last took 4 days ago with 8 lb weight loss, did not make dizziness  worse).   His blood pressure has been controlled/low at home (102/50s, etc- this is typical for him), today their BP is BP: (!) 102/58  He has a history of Diastolic CHF, denies dyspnea on exertion, orthopnea, paroxysmal nocturnal dyspnea and edema. Positive for scant edema.  Follows with Dr. Missy Sabins, at home he is at goal weight of 180. He is taking torsemide 150 mg,  and does metolazone 5 mg every 7-10 days (last took 4 days ago with 8 lb weight loss, did not make dizziness worse).  Wt Readings from Last 3 Encounters:  06/15/19 184 lb (83.5 kg)  05/01/19 185 lb (83.9 kg)  04/27/19 189 lb 6.4 oz (85.9 kg)   Patient is on Coumadin for PAF (paroxysmal atrial fibrillation) (HCC) [I48.0] Patients last INR is  Lab Results  Component Value Date   INR 1.4 (H) 05/01/2019   INR 2.5 (H) 03/28/2019   INR 2.4 (H) 02/22/2019    Patient denies SOB, CP, dizziness, and blood in stool/urine.  His coumadin dose was changed last visit. He has not taken ABX, has not missed any doses. Denies falls.   Current dose: 2 tabs daily 5 days a week and 1 tab 2 days a week (Tues/Fri).   Acute hgb/hct drop was noted on  by cardiology 04/27/19 (Hgb 13.1 > 10.7) who contacted [place] with concern. Pt denied sx of GI bleed (blood in stool, black/tarry stools, fatigue, etc), advised to hold coumadin over the weekend, CBC appeared significantly improved, very mild iron def, hemoccult x 3 was negative and coumadin was restarted.    He has hx of similar acute onset anemia 01/2017 with iron deficiency, was referred to GI Dr. 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 Injected. Treated with argon plasma coagulation (APC). Clip (MR conditional) was placed at that time. Colonoscopy showed polyp in appendix, was unable to resect so underwent appendectomy, had non-bleeding diverticula as well.   Had remained on iron supplement which he reported caused constipation but otherwise had done  well since. Iron supplement was discontinued Jan 2021 after recheck iron test was normal.   Today patient states he has note noted any atypical bleeding. However endorse 2 days of "dark, not black" stools last week, none this week, denies blood on stool or in toilet bowel.   Lab Results  Component Value Date   IRON 99 05/01/2019   TIBC 454 (H) 05/01/2019   FERRITIN 41 05/01/2019   CBC EXTENDED Latest Ref Rng & Units 06/12/2019 05/01/2019 04/27/2019  WBC 4.0 - 10.5 K/uL 9.8 10.4 8.1  RBC 4.22 - 5.81 MIL/uL 3.31(L) 4.02(L) 3.70(L)  HGB 13.0 - 17.0 g/dL 8.8(L) 11.5(L) 10.7(L)  HCT 39 - 52 % 30.0(L) 35.4(L) 35.3(L)  PLT 150 - 400 K/uL 289 250 228  NEUTROABS 1,500 - 7,800 cells/uL - 8,445(H) -  LYMPHSABS 850 - 3,900 cells/uL - 988 -   Lab Results  Component Value Date   IRON 99 05/01/2019   TIBC 454 (H) 05/01/2019   FERRITIN 41 05/01/2019   Lab Results  Component Value Date   GFRNONAA 32 (L) 06/12/2019     Past Medical History:  Diagnosis Date   Adrenal adenoma    Anemia    Anxiety    Arthritis    Atypical atrial flutter ([place]) 8/15, 10/15   a. DCCV 08/2013. b. s/p RFA 10/2013.   Basal cell carcinoma    CAD (coronary artery disease)    a. 04/2013 CABG x 2: LIMA to LAD, SVG to RI, EVH via R thigh.   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 s/p MAZE 04/2013 in setting of CABG. b. Amio stopped in 10/2013 after flutter ablation.   PFO (patent foramen ovale)    a. Small PFO by TEE 10/2013.   Pleural effusion, left    a. s/p thoracentesis 05/2013.   Respiratory failure ([place])    a. Hypoxia 10/2013 - required supp O2 as inpatient, did not require it at discharge.   S/P Maze operation for atrial fibrillation    a. 04/2013: Complete bilateral atrial lesion set using cryothermy and bipolar radiofrequency ablation with clipping of LA appendage (@ time of CABG)   S/P Maze operation for atrial fibrillation 04/05/2013   Complete bilateral atrial lesion set using cryothermy and bipolar radiofrequency ablation with clipping of LA appendage via median sternotomy approach      Allergies  Allergen Reactions   Sunflower Seed [Sunflower Oil] Swelling and Other (See Comments)    Tongue and lip swelling   Horse-Derived Products Other (See Comments)    Per allergy skin test UNSPECIFIED REACTION    Tetanus Toxoids Other (See Comments)    Per allergy skin test   Tetanus Toxoid     Other reaction(s): Other (See Comments) Rash(horse serum)    Current Outpatient Medications on File Prior to Visit  Medication Sig   acetaminophen (TYLENOL) 500 MG tablet Take 1,000 mg by mouth every 6 (six) hours as needed for moderate pain or headache.    allopurinol (ZYLOPRIM) 300 MG tablet Take 300 mg by mouth daily.   ALPRAZolam (XANAX) 1 MG tablet Take 0.5 tablets (0.5 mg total) by mouth at bedtime as needed for sleep.   Alum Hydroxide-Mag Carbonate (GAVISCON PO) Take 4 tablets by mouth daily as needed (acid reflux).    aspirin EC 81 MG tablet Take 81 mg by mouth daily.   atorvastatin (LIPITOR) 20 MG tablet Take 1 tablet 3 x /week   azelastine (ASTELIN) 0.1 % nasal spray Place 2 sprays into both nostrils 2 (two) times daily as needed for rhinitis or allergies.   B Complex Vitamins (VITAMIN B COMPLEX PO)  Take 1 tablet by mouth at bedtime.    bacitracin 500 UNIT/GM ointment Apply 1 application topically daily as needed for wound care.   Blood Glucose Monitoring Suppl (ACCU-CHEK AVIVA PLUS) w/Device KIT Check blood sugar 1  time  daily   Blood Glucose Monitoring Suppl DEVI Test blood sugar up to three times a day or as directed.   carvedilol (COREG) 6.25 MG tablet Take 0.5 tablets (3.125 mg total) by mouth in the morning AND 1 tablet (6.25 mg total) every evening.   Cholecalciferol (VITAMIN D PO) Take 5,000 Units by mouth daily.    colchicine 0.6 MG tablet Take 0.6 mg by mouth daily as needed (for gout flare up).    Continuous Blood Gluc Receiver (FREESTYLE LIBRE 14 DAY READER) DEVI 1 Device by Does not apply route 3 (three) times daily. Dx: E11.22, N18.4, Z79.4   Continuous Blood Gluc Sensor (FREESTYLE LIBRE 14 DAY SENSOR) MISC Check blood sugar 3 times a day. Dx: E11.22, N18.4, Z79.4   diphenhydrAMINE (BENADRYL) 25 MG tablet Take 25 mg by mouth at bedtime as needed for allergies.    ferrous sulfate 325 (65 FE) MG EC tablet Take 325 mg by mouth daily. Take with vitamin C   fluticasone (FLONASE) 50 MCG/ACT nasal spray Place 1 spray into both nostrils daily as needed for allergies.   glucose blood (ACCU-CHEK AVIVA PLUS) test strip CHECK BLOOD GLUCOSE 3 TIMES DAILY.   insulin NPH-regular Human (70-30) 100 UNIT/ML injection Inject 50 units into the skin subcutaneous every morning and inject 30units into the skin subcutaneous every evening   lidocaine (LIDODERM) 5 % Place 1 patch onto the skin daily as needed (for pain). Remove & Discard patch within 12 hours or as directed by MD    loratadine (CLARITIN) 10 MG tablet Take 10 mg by mouth daily as needed for allergies.    metolazone (ZAROXOLYN) 2.5 MG tablet TAKE 1 TABLET BY MOUTH  DAILY AS NEEDED FOR  SWELLING   Multiple Vitamins-Minerals (PRESERVISION AREDS 2) CAPS Take 1 capsule by mouth 2 (two) times daily.    polyvinyl alcohol  (ARTIFICIAL TEARS) 1.4 % ophthalmic solution Place 1 drop into both eyes daily as needed for dry eyes.   potassium chloride SA (KLOR-CON) 20 MEQ tablet Take 2 tablets 3 x /day for potassium   sertraline (ZOLOFT) 100 MG tablet Take 1 tablet Daily for Mood   torsemide (DEMADEX) 100 MG tablet Take 150 mg by mouth daily.   warfarin (COUMADIN) 5 MG tablet Take 1 to 2 tablets Daily to Prevent Blood Clots   No current facility-administered medications on file prior to visit.    ROS: Review of Systems  Constitutional: Negative for malaise/fatigue and weight loss.  HENT: Negative for congestion, hearing loss and tinnitus.   Eyes: Negative for blurred vision and double vision.  Respiratory: Negative for cough, sputum production, shortness of breath and wheezing.   Cardiovascular: Negative for chest pain, palpitations, orthopnea, claudication, leg swelling and PND.  Gastrointestinal: Negative for abdominal pain, blood in stool, constipation, diarrhea, heartburn, melena, nausea and vomiting.  Genitourinary: Negative.   Musculoskeletal: Negative for joint pain and myalgias.  Skin: Negative for rash.  Neurological: Negative for dizziness, tingling, sensory change, weakness and headaches.  Endo/Heme/Allergies: Negative for polydipsia. Bruises/bleeds easily.  Psychiatric/Behavioral: Negative.   All other systems reviewed and are negative.    Physical Exam:  BP (!) 102/58    Pulse 77    Temp 97.7 F (36.5 C)    Wt 184 lb (83.5 kg)    SpO2 97%    BMI 25.66 kg/m   General Appearance: Chronically ill appearing elderly male, well dressed, in no apparent distress. Eyes: PERRLA, EOMs, conjunctiva no swelling or erythema Sinuses: No Frontal/maxillary tenderness  ENT/Mouth: Ext aud canals clear, TMs without erythema, bulging. No erythema, swelling, or exudate on post pharynx.  Tonsils not swollen or erythematous. Hearing normal.  Neck: Supple, thyroid normal.  Respiratory: Respiratory effort normal,  BS equal bilaterally without rales, rhonchi, wheezing or stridor.  Cardio: RRR with no MRGs. Brisk peripheral pulses with scant non-pitting edema. Dizziness ellicited by next extension, however with no carotid bruits heard.  Abdomen: Soft, obese/mildly distended, + BS.  Non tender, no guarding, rebound, hernias, masses. Lymphatics: Non tender without lymphadenopathy.   Musculoskeletal: Full ROM, 5/5 strength, normal gait.  Skin: Warm, dry without rashes, lesions; he has fragile skin and numerous small ecchymoses to bilateral upper extremities.  Neuro: Cranial nerves intact. Normal muscle tone, no cerebellar symptoms. Sensation intact.  Psych: Awake and oriented X 3, normal affect, Insight and Judgment appropriate.    Izora Ribas, NP 6:08 PM [facility]

## 2019-06-15 NOTE — Telephone Encounter (Signed)
Patient called and reported he woke at 5:00 thid morning and felt like his blood sugar was low. He took 2 glucose tablets, but did not check his blood sugar. Later this morning, his fasting blood sugar was 121, when he got out of bed. He asked if he should adjust his insulin dose.  Per Dr Melford Aase, reduce the morning dose from 50 to 40 units and the PM dose from 30 to 20 units and check his blood sugar 3 times daily before meals. Patient I aware and expressed he was having dizziness not related to his blood pressure. Patient has an OV today with Liane Comber, NP.

## 2019-06-16 ENCOUNTER — Other Ambulatory Visit: Payer: Self-pay | Admitting: Adult Health

## 2019-06-16 DIAGNOSIS — D649 Anemia, unspecified: Secondary | ICD-10-CM

## 2019-06-16 LAB — CBC WITH DIFFERENTIAL/PLATELET
Absolute Monocytes: 1231 cells/uL — ABNORMAL HIGH (ref 200–950)
Basophils Absolute: 46 cells/uL (ref 0–200)
Basophils Relative: 0.4 %
Eosinophils Absolute: 127 cells/uL (ref 15–500)
Eosinophils Relative: 1.1 %
HCT: 30.5 % — ABNORMAL LOW (ref 38.5–50.0)
Hemoglobin: 9.5 g/dL — ABNORMAL LOW (ref 13.2–17.1)
Lymphs Abs: 874 cells/uL (ref 850–3900)
MCH: 26.9 pg — ABNORMAL LOW (ref 27.0–33.0)
MCHC: 31.1 g/dL — ABNORMAL LOW (ref 32.0–36.0)
MCV: 86.4 fL (ref 80.0–100.0)
MPV: 8.4 fL (ref 7.5–12.5)
Monocytes Relative: 10.7 %
Neutro Abs: 9223 cells/uL — ABNORMAL HIGH (ref 1500–7800)
Neutrophils Relative %: 80.2 %
Platelets: 305 10*3/uL (ref 140–400)
RBC: 3.53 10*6/uL — ABNORMAL LOW (ref 4.20–5.80)
RDW: 15.7 % — ABNORMAL HIGH (ref 11.0–15.0)
Total Lymphocyte: 7.6 %
WBC: 11.5 10*3/uL — ABNORMAL HIGH (ref 3.8–10.8)

## 2019-06-16 LAB — URINALYSIS, ROUTINE W REFLEX MICROSCOPIC
Bacteria, UA: NONE SEEN /HPF
Bilirubin Urine: NEGATIVE
Glucose, UA: NEGATIVE
Hgb urine dipstick: NEGATIVE
Ketones, ur: NEGATIVE
Leukocytes,Ua: NEGATIVE
Nitrite: NEGATIVE
Specific Gravity, Urine: 1.014 (ref 1.001–1.03)
Squamous Epithelial / HPF: NONE SEEN /HPF (ref <=5)
pH: 5.5 (ref 5.0–8.0)

## 2019-06-16 LAB — COMPLETE METABOLIC PANEL WITH GFR
AG Ratio: 1.6 (calc) (ref 1.0–2.5)
ALT: 19 U/L (ref 9–46)
AST: 18 U/L (ref 10–35)
Albumin: 4.1 g/dL (ref 3.6–5.1)
Alkaline phosphatase (APISO): 115 U/L (ref 35–144)
BUN/Creatinine Ratio: 20 (calc) (ref 6–22)
BUN: 49 mg/dL — ABNORMAL HIGH (ref 7–25)
CO2: 30 mmol/L (ref 20–32)
Calcium: 8.8 mg/dL (ref 8.6–10.3)
Chloride: 93 mmol/L — ABNORMAL LOW (ref 98–110)
Creat: 2.49 mg/dL — ABNORMAL HIGH (ref 0.70–1.11)
GFR, Est African American: 27 mL/min/{1.73_m2} — ABNORMAL LOW (ref >=60)
GFR, Est Non African American: 23 mL/min/{1.73_m2} — ABNORMAL LOW (ref >=60)
Globulin: 2.5 g/dL (calc) (ref 1.9–3.7)
Glucose, Bld: 243 mg/dL — ABNORMAL HIGH (ref 65–99)
Potassium: 3.6 mmol/L (ref 3.5–5.3)
Sodium: 135 mmol/L (ref 135–146)
Total Bilirubin: 0.6 mg/dL (ref 0.2–1.2)
Total Protein: 6.6 g/dL (ref 6.1–8.1)

## 2019-06-16 LAB — PROTIME-INR
INR: 3 — ABNORMAL HIGH
Prothrombin Time: 30.5 s — ABNORMAL HIGH (ref 9.0–11.5)

## 2019-06-19 ENCOUNTER — Telehealth: Payer: Self-pay | Admitting: Hematology

## 2019-06-19 NOTE — Telephone Encounter (Signed)
Received a new hem referral from Liane Comber, NP for acute on chronic anemia. [name] has been cld and scheduled to see Dr. Irene Limbo on 6/22 at 1pm. Pt aware to arrive 15 minutes early.

## 2019-06-21 ENCOUNTER — Telehealth: Payer: Self-pay | Admitting: Physician Assistant

## 2019-06-21 DIAGNOSIS — R42 Dizziness and giddiness: Secondary | ICD-10-CM

## 2019-06-21 NOTE — Telephone Encounter (Signed)
-----   Message from Oliver Pila sent at 06/20/2019 11:50 AM EDT ----- Regarding: Vascular order [name],  Vascular lab needs a NEW order. Please do not edit current order.  Please enter new order: [id number]  use [facility].   thanks

## 2019-06-23 ENCOUNTER — Other Ambulatory Visit: Payer: Self-pay

## 2019-06-23 ENCOUNTER — Encounter (INDEPENDENT_AMBULATORY_CARE_PROVIDER_SITE_OTHER): Payer: Self-pay | Admitting: Ophthalmology

## 2019-06-23 ENCOUNTER — Ambulatory Visit (INDEPENDENT_AMBULATORY_CARE_PROVIDER_SITE_OTHER): Payer: Medicare Other | Admitting: Ophthalmology

## 2019-06-23 VITALS — BP 120/60 | HR 59

## 2019-06-23 DIAGNOSIS — Z961 Presence of intraocular lens: Secondary | ICD-10-CM

## 2019-06-23 DIAGNOSIS — H4311 Vitreous hemorrhage, right eye: Secondary | ICD-10-CM | POA: Diagnosis not present

## 2019-06-23 DIAGNOSIS — H34832 Tributary (branch) retinal vein occlusion, left eye, with macular edema: Secondary | ICD-10-CM

## 2019-06-23 DIAGNOSIS — I10 Essential (primary) hypertension: Secondary | ICD-10-CM

## 2019-06-23 DIAGNOSIS — Z8669 Personal history of other diseases of the nervous system and sense organs: Secondary | ICD-10-CM

## 2019-06-23 DIAGNOSIS — H353132 Nonexudative age-related macular degeneration, bilateral, intermediate dry stage: Secondary | ICD-10-CM

## 2019-06-23 DIAGNOSIS — H35033 Hypertensive retinopathy, bilateral: Secondary | ICD-10-CM

## 2019-06-23 DIAGNOSIS — H3581 Retinal edema: Secondary | ICD-10-CM

## 2019-06-23 NOTE — Progress Notes (Signed)
[facility] Note  06/23/2019     CHIEF COMPLAINT Patient presents for Retina Evaluation   HISTORY OF PRESENT ILLNESS: [name]. is a 79 y.o. male who presents to the clinic today for:   HPI    Retina Evaluation    In right eye.  This started 1 day ago.  Duration of 1 day.  Context:  distance vision, mid-range vision and near vision.  Treatments tried include artificial tears.  Response to treatment was no improvement.  I, the attending physician,  performed the HPI with the patient and updated documentation appropriately.          Comments    79 y/o male pt c/o sudden onset decreased vision OD x 1 day.  Pt of Dr. Zigmund Daniel.  Last seen 4.16.21.  Pt does not recall doing anything that may have triggered symptoms, but he did hit the right side of his head on the car door about 2 wks ago.  VA OD like "looking through a gray curtain."  Pt states its "hard to even make out shapes and colors."  Denies pain, FOL, floaters.  No problems OS.  AT prn OU.  BS 131 this a.m.  A1C 7.1       Last edited by Bernarda Caffey, MD on 06/23/2019  1:02 PM. (History)     Patient states sudden onset of blurry vision OD X 1 day. Patient hit the right side of his head on the car door this past week. Patient denies any change in vision after hitting head. Vision in the right eye is "like looking through a gray curtain." The patient states "it is hard to make out shapes and colors with the right eye." Patient denies new floaters or flashes of light. Patient states that this morning, vision seems to have improved some--can make out more shapes. Had some dizziness and light-headedness--being evaluated by vascular specialists.    Referring physician: Hayden Pedro, MD [place],  [place] [id number]  HISTORICAL INFORMATION:   Selected notes from the MEDICAL RECORD NUMBER Self referred pt of Dr. Zigmund Daniel reporting vision loss OD, as of 06/22/19   CURRENT  MEDICATIONS: Current Outpatient Medications (Ophthalmic Drugs)  Medication Sig  . polyvinyl alcohol (ARTIFICIAL TEARS) 1.4 % ophthalmic solution Place 1 drop into both eyes daily as needed for dry eyes.   No current facility-administered medications for this visit. (Ophthalmic Drugs)   Current Outpatient Medications (Other)  Medication Sig  . acetaminophen (TYLENOL) 500 MG tablet Take 1,000 mg by mouth every 6 (six) hours as needed for moderate pain or headache.   . allopurinol (ZYLOPRIM) 300 MG tablet Take 300 mg by mouth daily.  [name] ALPRAZolam (XANAX) 1 MG tablet Take 0.5 tablets (0.5 mg total) by mouth at bedtime as needed for sleep.  [name] Alum Hydroxide-Mag Carbonate (GAVISCON PO) Take 4 tablets by mouth daily as needed (acid reflux).   [name] aspirin EC 81 MG tablet Take 81 mg by mouth daily.  [name] atorvastatin (LIPITOR) 20 MG tablet Take 1 tablet 3 x /week  . azelastine (ASTELIN) 0.1 % nasal spray Place 2 sprays into both nostrils 2 (two) times daily as needed for rhinitis or allergies.  . B Complex Vitamins (VITAMIN B COMPLEX PO) Take 1 tablet by mouth at bedtime.   . bacitracin 500 UNIT/GM ointment Apply 1 application topically daily as needed for wound care.  . Blood Glucose Monitoring Suppl (ACCU-CHEK AVIVA PLUS) w/Device KIT  Check blood sugar 1 time  daily  . Blood Glucose Monitoring Suppl DEVI Test blood sugar up to three times a day or as directed.  . carvedilol (COREG) 6.25 MG tablet Take 0.5 tablets (3.125 mg total) by mouth in the morning AND 1 tablet (6.25 mg total) every evening.  . Cholecalciferol (VITAMIN D PO) Take 5,000 Units by mouth daily.   . colchicine 0.6 MG tablet Take 0.6 mg by mouth daily as needed (for gout flare up).   . Continuous Blood Gluc Receiver (FREESTYLE LIBRE 14 DAY READER) DEVI 1 Device by Does not apply route 3 (three) times daily. Dx: E11.22, N18.4, Z79.4  . Continuous Blood Gluc Sensor (FREESTYLE LIBRE 14 DAY SENSOR) MISC Check blood sugar 3 times a day. Dx:  E11.22, N18.4, Z79.4  . diphenhydrAMINE (BENADRYL) 25 MG tablet Take 25 mg by mouth at bedtime as needed for allergies.   . ferrous sulfate 325 (65 FE) MG EC tablet Take 325 mg by mouth daily. Take with vitamin C  . fluticasone (FLONASE) 50 MCG/ACT nasal spray Place 1 spray into both nostrils daily as needed for allergies.  [name] glucose blood (ACCU-CHEK AVIVA PLUS) test strip CHECK BLOOD GLUCOSE 3 TIMES DAILY.  [name] insulin NPH-regular Human (70-30) 100 UNIT/ML injection Inject 50 units into the skin subcutaneous every morning and inject 30units into the skin subcutaneous every evening  . lidocaine (LIDODERM) 5 % Place 1 patch onto the skin daily as needed (for pain). Remove & Discard patch within 12 hours or as directed by MD   . loratadine (CLARITIN) 10 MG tablet Take 10 mg by mouth daily as needed for allergies.   . metolazone (ZAROXOLYN) 2.5 MG tablet TAKE 1 TABLET BY MOUTH  DAILY AS NEEDED FOR  SWELLING  . Multiple Vitamins-Minerals (PRESERVISION AREDS 2) CAPS Take 1 capsule by mouth 2 (two) times daily.   . potassium chloride SA (KLOR-CON) 20 MEQ tablet Take 2 tablets 3 x /day for potassium  . sertraline (ZOLOFT) 100 MG tablet Take 1 tablet Daily for Mood  . torsemide (DEMADEX) 100 MG tablet Take 150 mg by mouth daily.  [name] warfarin (COUMADIN) 5 MG tablet Take 1 to 2 tablets Daily to Prevent Blood Clots   No current facility-administered medications for this visit. (Other)      REVIEW OF SYSTEMS: ROS    Positive for: Gastrointestinal, Endocrine, Cardiovascular, Eyes, Respiratory   Negative for: Constitutional, Neurological, Skin, Genitourinary, Musculoskeletal, HENT, Psychiatric, Allergic/Imm, Heme/Lymph   Last edited by Matthew Folks, COA on 06/23/2019 10:05 AM. (History)       ALLERGIES Allergies  Allergen Reactions  . Sunflower Seed [Sunflower Oil] Swelling and Other (See Comments)    Tongue and lip swelling  . Horse-Derived Products Other (See Comments)    Per allergy skin  test UNSPECIFIED REACTION   . Tetanus Toxoids Other (See Comments)    Per allergy skin test  . Tetanus Toxoid     Other reaction(s): Other (See Comments) Rash(horse serum)    PAST MEDICAL HISTORY Past Medical History:  Diagnosis Date  . Adrenal adenoma   . Anemia   . Anxiety   . Arthritis   . Atypical atrial flutter ([place]) 8/15, 10/15   a. DCCV 08/2013. b. s/p RFA 10/2013.  [name] Basal cell carcinoma   . CAD (coronary artery disease)    a. 04/2013 CABG x 2: LIMA to LAD, SVG to RI, EVH via R thigh.  . Cellulitis 12/2015   left leg  . Chronic diastolic  congestive heart failure ([place])   . CKD (chronic kidney disease), stage III   . Depression   . Diabetes mellitus type II   . Diverticulosis 2001  . DJD (degenerative joint disease)   . GERD (gastroesophageal reflux disease)   . Gout   . H/O hiatal hernia   . History of cardioversion    x3 (years uncertain)  . Hx of adenomatous colonic polyps   . Hyperlipidemia   . Hypertension   . Hypertensive cardiomyopathy ([place])   . Hypertensive retinopathy    OU  . Macular degeneration    OU  . Obstructive sleep apnea    compliant with CPAP  . 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 Persistent atrial fibrillation ([place])    a. s/p MAZE 04/2013 in setting of CABG. b. Amio stopped in 10/2013 after flutter ablation.  [name] PFO (patent foramen ovale)    a. Small PFO by TEE 10/2013.  [name] Pleural effusion, left    a. s/p thoracentesis 05/2013.  [name] Respiratory failure ([place])    a. Hypoxia 10/2013 - required supp O2 as inpatient, did not require it at discharge.  . S/P Maze operation for atrial fibrillation    a. 04/2013: Complete bilateral atrial lesion set using cryothermy and bipolar radiofrequency ablation with clipping of LA appendage (@ time of CABG)  . S/P Maze operation for atrial fibrillation 04/05/2013   Complete bilateral atrial lesion set  using cryothermy and bipolar radiofrequency ablation with clipping of LA appendage via median sternotomy approach    Past Surgical History:  Procedure Laterality Date  . APPENDECTOMY  03/05/2017   laproscopic  . ATRIAL FIBRILLATION ABLATION N/A 10/26/2013   Procedure: ATRIAL FIBRILLATION ABLATION;  Surgeon: Coralyn Mark, MD;  Location: [facility] CATH LAB;  Service: Cardiovascular;  Laterality: N/A;  . BASAL CELL CARCINOMA EXCISION     x3 on face  . CARDIAC CATHETERIZATION     myocardial bridge but no cad  . CARDIOVERSION N/A 08/23/2013   Procedure: CARDIOVERSION;  Surgeon: Sanda Klein, MD;  Location: [facility] [place];  Service: Cardiovascular;  Laterality: N/A;  . CARPOMETACARPEL SUSPENSION PLASTY Left 02/14/2014   Procedure: CARPOMETACARPEL ([place]) SUSPENSIONPLASTY THUMB  WITH  ABDUCTOR POLLICIS LONGUS TRANSFER AND STENOSING TENOSYNOVITIS RELEASE LEFT WRIST;  Surgeon: Charlotte Crumb, MD;  Location: [facility];  Service: Orthopedics;  Laterality: Left;  . CATARACT EXTRACTION Bilateral   . COLONOSCOPY WITH PROPOFOL N/A 01/26/2017   Procedure: COLONOSCOPY WITH PROPOFOL;  Surgeon: Doran Stabler, MD;  Location: [facility];  Service: Gastroenterology;  Laterality: N/A;  . CORONARY ARTERY BYPASS GRAFT N/A 04/05/2013   Procedure: CORONARY ARTERY BYPASS GRAFTING (CABG) TIMES TWO USING LEFT INTERNAL MAMMARY ARTERY AND RIGHT SAPHENOUS LEG VEIN HARVESTED ENDOSCOPICALLY;  Surgeon: Rexene Alberts, MD;  Location: [place];  Service: Open Heart Surgery;  Laterality: N/A;  . ESOPHAGOGASTRODUODENOSCOPY (EGD) WITH PROPOFOL N/A 01/26/2017   Procedure: ESOPHAGOGASTRODUODENOSCOPY (EGD) WITH PROPOFOL;  Surgeon: Doran Stabler, MD;  Location: [facility];  Service: Gastroenterology;  Laterality: N/A;  . EYE SURGERY Bilateral    Cat Sx  . EYE SURGERY Right    RD repair - SB  . GREAT TOE ARTHRODESIS, INTERPHALANGEAL JOINT     Right foot  . INTRAOPERATIVE TRANSESOPHAGEAL ECHOCARDIOGRAM N/A 04/05/2013    Procedure: INTRAOPERATIVE TRANSESOPHAGEAL ECHOCARDIOGRAM;  Surgeon: Rexene Alberts, MD;  Location: [facility];  Service: Open Heart Surgery;  Laterality:  N/A;  . LAPAROSCOPIC APPENDECTOMY N/A 03/05/2017   Procedure: APPENDECTOMY LAPAROSCOPIC;  Surgeon: Judeth Horn, MD;  Location: [facility];  Service: General;  Laterality: N/A;  . LEFT HEART CATHETERIZATION WITH CORONARY ANGIOGRAM N/A 03/07/2013   Procedure: LEFT HEART CATHETERIZATION WITH CORONARY ANGIOGRAM;  Surgeon: Burnell Blanks, MD;  Location: [facility] CATH LAB;  Service: Cardiovascular;  Laterality: N/A;  . MAZE N/A 04/05/2013   Procedure: MAZE;  Surgeon: Rexene Alberts, MD;  Location: [facility];  Service: Open Heart Surgery;  Laterality: N/A;  . Polinydal cyst     Removed  . POLYPECTOMY    . Retina repair-right    . RETINAL DETACHMENT SURGERY Right    RD Repair - SB  . RIGHT HEART CATHETERIZATION N/A 05/03/2014   Procedure: RIGHT HEART CATH;  Surgeon: Jolaine Artist, MD;  Location: [facility] CATH LAB;  Service: Cardiovascular;  Laterality: N/A;  . TEE WITHOUT CARDIOVERSION N/A 08/23/2013   Procedure: TRANSESOPHAGEAL ECHOCARDIOGRAM (TEE);  Surgeon: Sanda Klein, MD;  Location: [facility] ENDOSCOPY;  Service: Cardiovascular;  Laterality: N/A;  . TEE WITHOUT CARDIOVERSION N/A 10/26/2013   Procedure: TRANSESOPHAGEAL ECHOCARDIOGRAM (TEE);  Surgeon: Sueanne Margarita, MD;  Location: [facility] ENDOSCOPY;  Service: Cardiovascular;  Laterality: N/A;  . TEE WITHOUT CARDIOVERSION N/A 06/05/2014   Procedure: TRANSESOPHAGEAL ECHOCARDIOGRAM (TEE);  Surgeon: Thayer Headings, MD;  Location: [facility] ENDOSCOPY;  Service: Cardiovascular;  Laterality: N/A;  . TRAPEZIUM RESECTION      FAMILY HISTORY Family History  Problem Relation Age of Onset  . Dementia Father   . Colon cancer Mother        Family History/Uncle   . Colon polyps Mother        Family History  . Atrial fibrillation Mother   . Hypertension Mother   . Colon polyps Sister        Family history  . Diabetes Maternal  Uncle   . Stroke Paternal Uncle     SOCIAL HISTORY Social History   Tobacco Use  . Smoking status: Former Smoker    Packs/day: 4.00    Years: 25.00    Pack years: 100.00    Types: Cigarettes    Quit date: 01/05/1981    Years since quitting: 38.4  . Smokeless tobacco: Never Used  Vaping Use  . Vaping Use: Never assessed  Substance Use Topics  . Alcohol use: Yes    Alcohol/week: 1.0 standard drink    Types: 1 Shots of liquor per week    Comment: 1-5 drinks per week  . Drug use: No         OPHTHALMIC EXAM:  Base Eye Exam    Visual Acuity (Snellen - Linear)      Right Left   Dist cc HM 20/30 -2   Dist ph cc NI 20/20 -2   Correction: Glasses       Tonometry (Tonopen, 10:11 AM)      Right Left   Pressure 13 14       Pupils      Dark Light Shape React APD   Right 3 2 Round Brisk None   Left 3 2 Round Brisk None       Visual Fields (Counting fingers)      Left Right    Full    Restrictions  Total superior temporal, inferior temporal, superior nasal, inferior nasal deficiencies       Extraocular Movement      Right Left    Full, Ortho Full, Ortho  Neuro/Psych    Oriented x3: Yes   Mood/Affect: Normal       Dilation    Both eyes: 1.0% Mydriacyl, 2.5% Phenylephrine @ 10:11 AM        Slit Lamp and Fundus Exam    Slit Lamp Exam      Right Left   Lids/Lashes mld ptosis, dermatochalasis, mild MGD  dermatochalasis, mild MGD   Conjunctiva/Sclera white and quiet white and quiet   Cornea arcus, 1+ PEE, mild tear film debris arcus, trae PEE, mild tear film debris   Anterior Chamber deep, +cell/RBC deep and clear   Iris round and dilated round and dilated, scattered peripheral TID's   Lens 3 piece IOL in sulcus with +pseudophakodonesis, mild displacement inferotemporally 3 piece IOL in sulcus with + mild pseudophakodenisis    Vitreous +pigment, +RBC, syneresis, +VH--diffuse syneresis, +pigment, PVD, vitreous condensations       Fundus Exam      Right  Left   Disc very hazy view, pefused temporal pallor, compact, tilted, temporal PPA   C/D Ratio  0.5   Macula very hazy view, grossly attached blunted foveal reflex, RPE mottling and clumping, no heme or edema   Vessels  Vascular attenuation, Tortuous   Periphery hazy view, retina grossly attached over buckle, peripheral chorioretinal scarring attached, focal pigmented CR atrophy temporally        Refraction    Wearing Rx      Sphere Cylinder Axis Add   Right -2.00 +2.00 148 +2.75   Left -0.75 +1.75 005 +2.75   Age: 71yr   Type: PAL       Manifest Refraction      Sphere Cylinder Axis Dist VA   Right -2.00 +2.00 148 20/70-2   Left -0.75 +1.75 005 20/25-2  Pt could see moderately well OD during first part of refraction, then all of a sudden, his vision OD went back to HM.  Dr. MZigmund Danielhas noted in the past that pt's IOL OD moves.          IMAGING AND PROCEDURES  Imaging and Procedures for @TODAY @  OCT, Retina - OU - Both Eyes       Right Eye Quality was poor (No view). Findings include (No image obtained due to hazy view).   Left Eye Quality was good. Central Foveal Thickness: 275. Progression has been stable. Findings include normal foveal contour, no IRF, no SRF, intraretinal hyper-reflective material, myopic contour (Trace ERM).   Notes *Images captured and stored on drive  Diagnosis / Impression:  OD: no view OS: NFP, no IRF/SRF  Clinical management:  See below  Abbreviations: NFP - Normal foveal profile. CME - cystoid macular edema. PED - pigment epithelial detachment. IRF - intraretinal fluid. SRF - subretinal fluid. EZ - ellipsoid zone. ERM - epiretinal membrane. ORA - outer retinal atrophy. ORT - outer retinal tubulation. SRHM - subretinal hyper-reflective material        B-Scan Ultrasound - OD - Right Eye       Quality was good. Findings included vitreous hemorrhage, vitreous opacities.   Notes **Images stored on drive**  Impression: OD:  vitreous opacities consistent with hemorrhage; no obvious RT/RD or mass                 ASSESSMENT/PLAN:    ICD-10-CM   1. Retinal edema  H35.81 OCT, Retina - OU - Both Eyes  2. Vitreous hemorrhage, right eye (HCC)  H43.11 B-Scan Ultrasound - OD - Right Eye  3. 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 History of retinal detachment  Z86.69   5. Intermediate stage nonexudative age-related macular degeneration of both eyes  H35.3132   6. Essential hypertension  I10   7. Hypertensive retinopathy of both eyes  H35.033   8. Pseudophakia of both eyes  Z96.1     1,2. 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 Zigmund Daniel or Dr. Coralyn Pear  3. BRVO/HRVO OS with edema  - receiving avastin treatments with Dr. Zigmund Daniel  - keep appointments as scheduled  4. 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 Hilma Favors  5. Age related macular degeneration, non-exudative, both eyes  - The incidence, anatomy, and pathology of dry AMD, risk of progression, and the AREDS and AREDS 2 study including smoking risks discussed with patient.  - Recommend amsler grid monitoring  - f/u  with Dr. Zigmund Daniel  6,7. Hypertensive retinopathy OU  - significant cardiac history  - discussed importance of tight BP control  - monitor  8 Pseudophakia OU  - s/p CE/IOL OU  - 3-piece IOLs in good postion  - monitor   Ophthalmic Meds Ordered this visit:  No orders of the defined types were placed in this encounter.      Return in about 1 week (around 06/30/2019).  There are no Patient Instructions on file for this visit.   Explained the diagnoses, plan, and follow up with the patient and they expressed understanding.  Patient expressed understanding of the importance of proper follow up care.   This document serves as a record of services personally performed by Gardiner Sleeper, MD, PhD. It was created on their behalf by Roselee Nova, COMT. The creation of this record is the provider's dictation and/or activities during the visit.  Electronically signed by: Roselee Nova, COMT 06/25/19 1:03 AM   Gardiner Sleeper, M.D., Ph.D. Diseases & Surgery of the Retina and Vitreous [facility]  I have reviewed the above documentation for accuracy and completeness, and I agree with the above. Gardiner Sleeper, M.D., Ph.D. 06/25/19 1:03 AM   Abbreviations: M myopia (nearsighted); A astigmatism; H hyperopia (farsighted); P presbyopia; Mrx spectacle prescription;  CTL contact lenses; OD right eye; OS left eye; OU both eyes  XT exotropia; ET esotropia; PEK punctate epithelial keratitis; PEE punctate epithelial erosions; DES dry eye syndrome; MGD meibomian gland dysfunction; ATs artificial tears; PFAT's preservative free artificial tears; [place] nuclear sclerotic cataract; PSC posterior subcapsular cataract; ERM epi-retinal membrane; PVD posterior vitreous detachment; RD retinal detachment; DM diabetes mellitus; DR diabetic retinopathy; NPDR non-proliferative diabetic retinopathy; PDR proliferative diabetic retinopathy; CSME clinically significant macular edema; DME  diabetic  macular edema; dbh dot blot hemorrhages; CWS cotton wool spot; POAG primary open angle glaucoma; C/D cup-to-disc ratio; HVF humphrey visual field; GVF goldmann visual field; OCT optical coherence tomography; IOP intraocular pressure; BRVO Branch retinal vein occlusion; CRVO central retinal vein occlusion; CRAO central retinal artery occlusion; BRAO branch retinal artery occlusion; RT retinal tear; SB scleral buckle; PPV pars plana vitrectomy; VH Vitreous hemorrhage; PRP panretinal laser photocoagulation; IVK intravitreal kenalog; VMT vitreomacular traction; MH Macular hole;  NVD neovascularization of the disc; NVE neovascularization elsewhere; AREDS age related eye disease study; ARMD age related macular degeneration; POAG primary open angle glaucoma; EBMD epithelial/anterior basement membrane dystrophy; ACIOL anterior chamber intraocular lens; IOL intraocular lens; PCIOL posterior chamber intraocular lens; Phaco/IOL phacoemulsification with intraocular lens placement; [place] photorefractive keratectomy; LASIK laser assisted in situ keratomileusis; HTN hypertension; DM diabetes mellitus; COPD chronic obstructive pulmonary disease

## 2019-06-25 ENCOUNTER — Encounter (INDEPENDENT_AMBULATORY_CARE_PROVIDER_SITE_OTHER): Payer: Self-pay | Admitting: Ophthalmology

## 2019-06-27 ENCOUNTER — Telehealth: Payer: Self-pay | Admitting: Hematology

## 2019-06-27 ENCOUNTER — Other Ambulatory Visit: Payer: Self-pay

## 2019-06-27 ENCOUNTER — Inpatient Hospital Stay: Payer: Medicare Other | Admitting: Hematology

## 2019-06-27 ENCOUNTER — Inpatient Hospital Stay: Payer: Medicare Other

## 2019-06-27 NOTE — Telephone Encounter (Signed)
Mr. [name] came to his appt late today to see Dr. Irene Limbo. Pt has been rescheduled to see Dr. Irene Limbo on 6/30 at [facility] provided the appt date and time to the pt.

## 2019-06-28 ENCOUNTER — Encounter (INDEPENDENT_AMBULATORY_CARE_PROVIDER_SITE_OTHER): Payer: Medicare Other | Admitting: Ophthalmology

## 2019-06-28 DIAGNOSIS — H34832 Tributary (branch) retinal vein occlusion, left eye, with macular edema: Secondary | ICD-10-CM | POA: Diagnosis not present

## 2019-06-28 DIAGNOSIS — H4311 Vitreous hemorrhage, right eye: Secondary | ICD-10-CM

## 2019-06-28 DIAGNOSIS — I10 Essential (primary) hypertension: Secondary | ICD-10-CM | POA: Diagnosis not present

## 2019-06-28 DIAGNOSIS — H35033 Hypertensive retinopathy, bilateral: Secondary | ICD-10-CM

## 2019-06-28 DIAGNOSIS — H43813 Vitreous degeneration, bilateral: Secondary | ICD-10-CM

## 2019-06-28 DIAGNOSIS — H353111 Nonexudative age-related macular degeneration, right eye, early dry stage: Secondary | ICD-10-CM

## 2019-06-29 ENCOUNTER — Other Ambulatory Visit: Payer: Self-pay

## 2019-06-29 ENCOUNTER — Other Ambulatory Visit: Payer: Medicare Other

## 2019-06-29 ENCOUNTER — Other Ambulatory Visit: Payer: Self-pay | Admitting: Adult Health

## 2019-06-29 DIAGNOSIS — Z79899 Other long term (current) drug therapy: Secondary | ICD-10-CM | POA: Diagnosis not present

## 2019-06-29 DIAGNOSIS — Z7901 Long term (current) use of anticoagulants: Secondary | ICD-10-CM | POA: Diagnosis not present

## 2019-06-29 DIAGNOSIS — I480 Paroxysmal atrial fibrillation: Secondary | ICD-10-CM | POA: Diagnosis not present

## 2019-06-30 LAB — PROTIME-INR
INR: 2.8 — ABNORMAL HIGH
Prothrombin Time: 28.6 s — ABNORMAL HIGH (ref 9.0–11.5)

## 2019-07-02 NOTE — Progress Notes (Signed)
Advanced [facility] Failure Clinic Note .  Date:  07/03/2019   ID:  [name], DOB 10-Sep-1940, MRN [id number]  Location: Home  Provider location: [facility] Type of Visit: Established patient  PCP:  [name], MD  Cardiologist:  No primary care provider on file. Primary HF: Terrionna Bridwell  Chief Complaint: Heart Failure follow-up   History of Present Illness:  Izaak is an 79 y/o male with COPD , DM, PAF, 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, CKD, AFL s/p ablation in 10/15. 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 Had surgery in 4/15. Did well for a while went to cardiac rehab and was feeling fine.  Developed AFL in 10/15 and underwent RFA.   In 3/16 began to develop severe SOB. Started O2. Says his symptoms got worse almost overnight. 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 He has seen Dr. Roxy Manns but felt to have no optimal surgical options for repair. His case was also presented to Dr. Michaelle Birks at [facility] who agreed that there was no way to baffle or reroute the anomalous PV flow to the LA. He had a TEE which showed LVEF 60-65% with a dilated right side and a small PFO. He has also been seen by Dr. Lake Bells who performed PFTs that showed significant restrictive lung disease with a low DLCO. He had f/u with Dr. Gilles Chiquito in the [facility] who felt his symptoms were multifactorial.  Had colonoscopy in 2019 and found to have a polyp and what sounds like AVMs. Subsequently had a laparoscopic removal of appendiceal adenoma which was benign.   Recently struggling with progressive SOB. I felt he was volume overloaded. Torsemide changed to 100/50 and prn metolazone added.   Here for routine f/u. Remains on torsemide 157m in am (instead of 100/50). Takes metolazone 567mabout once a week. Took one on Saturday and lost 6  pounds. Continues with dyspnea on minimal exertion. Says it is marginally worse. Walks to the mailbox to get the mail and has 3 steps to come up on the way back and he dreads that. Legs very weak. No CP. Edema. Well controlled.    Cardiac studies:  Echo 02/20/19 LVEF 60-65% RV markedly dilated. Mod HK. RVSP 60.7 mmHg. Personally reviewed  Echo 12/18 LVEF 60% RV dilated mildly HK   PFTs (7/16) FEV1 1.45 L (45%) FVC 1.77 L (40%) DLCO 46%  RHC 4/16 RA = 18 RV = 72/4/17 PA = 76/27 (46) PCW = 21 Fick cardiac output/index (using PA sat) = 9.2/4.45 Thermo CO/CI = 10.0/4.87 PVR = 2.2 WU Fick cardiac output/index (using high SVC sat) = 5.2/2.5 Pulse-ox saturation = 89%  High SVC sat = 54% Low SVC sat = 81% (at SVC/RA junction) RA sat = 68% RV sat = 66% PA sat = 68%, 69% IVC sat =56%   VQ/CT negative for PE   TEE 10/15 small PFO  Ab u/s 6/16 liver normal + ascites. Medico renal kidney disease.     [place]ies symptoms worrisome for COVID 19.   Past Medical History:  Diagnosis Date   Adrenal adenoma    Anemia    Anxiety    Arthritis    [place]ical [place] ([place], [place]/15   a. DCCV 08/2013. b. s/p RFA 10/2013.   Basal  cell carcinoma    CAD (coronary artery disease)    a. 04/2013 CABG x 2: LIMA to LAD, SVG to RI, EVH via R thigh.   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 s/p MAZE 04/2013 in setting of CABG. b. Amio stopped in 10/2013 after flutter ablation.   PFO (patent foramen ovale)    a. Small PFO by TEE 10/2013.   Pleural effusion, left    a. s/p thoracentesis 05/2013.   Respiratory failure ([place])    a. Hypoxia 10/2013 - required supp O2 as inpatient, did not require it at discharge.   S/P Maze operation for atrial fibrillation    a. 04/2013: Complete bilateral atrial lesion set using cryothermy and bipolar radiofrequency ablation with clipping of LA appendage (@ time of CABG)   S/P Maze operation for atrial fibrillation 04/05/2013   Complete bilateral atrial lesion set using cryothermy and bipolar radiofrequency ablation with clipping of LA appendage via median sternotomy approach    Past Surgical History:  Procedure Laterality Date   APPENDECTOMY  03/05/2017   laproscopic   ATRIAL FIBRILLATION ABLATION N/A 10/26/2013   Procedure: ATRIAL FIBRILLATION ABLATION;  Surgeon: Coralyn Mark, MD;  Location: [place] [facility];  Service: Cardiovascular;  Laterality: N/A;   BASAL CELL CARCINOMA EXCISION     x3 on face   CARDIAC CATHETERIZATION     myocardial bridge but no cad   CARDIOVERSION N/A 08/23/2013   Procedure: CARDIOVERSION;  Surgeon: Sanda Klein, MD;  Location: [place];  Service: Cardiovascular;  Laterality: N/A;   CARPOMETACARPEL SUSPENSION PLASTY Left 02/14/2014   Procedure: CARPOMETACARPEL ([place]) SUSPENSIONPLASTY THUMB  WITH  ABDUCTOR POLLICIS LONGUS TRANSFER AND STENOSING TENOSYNOVITIS RELEASE LEFT WRIST;  Surgeon: Charlotte Crumb, MD;  Location: [place];  Service: Orthopedics;  Laterality: Left;   CATARACT EXTRACTION Bilateral    COLONOSCOPY WITH PROPOFOL N/A 01/26/2017   Procedure: COLONOSCOPY WITH PROPOFOL;  Surgeon: Doran Stabler,  MD;  Location: [facility];  Service: Gastroenterology;  Laterality: N/A;   CORONARY ARTERY BYPASS GRAFT N/A 04/05/2013   Procedure: CORONARY ARTERY BYPASS GRAFTING (CABG) TIMES TWO USING LEFT INTERNAL MAMMARY ARTERY AND RIGHT SAPHENOUS LEG VEIN HARVESTED ENDOSCOPICALLY;  Surgeon: Rexene Alberts, MD;  Location: [place];  Service: Open Heart Surgery;  Laterality: N/A;   ESOPHAGOGASTRODUODENOSCOPY (EGD) WITH PROPOFOL N/A 01/26/2017   Procedure: ESOPHAGOGASTRODUODENOSCOPY (EGD) WITH PROPOFOL;  Surgeon: Doran Stabler, MD;  Location: [facility];  Service: Gastroenterology;  Laterality: N/A;   EYE SURGERY Bilateral    Cat Sx   EYE SURGERY Right    RD repair - SB   GREAT TOE  ARTHRODESIS, INTERPHALANGEAL JOINT     Right foot   INTRAOPERATIVE TRANSESOPHAGEAL ECHOCARDIOGRAM N/A 04/05/2013   Procedure: INTRAOPERATIVE TRANSESOPHAGEAL ECHOCARDIOGRAM;  Surgeon: Rexene Alberts, MD;  Location: [place];  Service: Open Heart Surgery;  Laterality: N/A;   LAPAROSCOPIC APPENDECTOMY N/A 03/05/2017   Procedure: APPENDECTOMY LAPAROSCOPIC;  Surgeon: Judeth Horn, MD;  Location: [place];  Service: General;  Laterality: N/A;   LEFT HEART CATHETERIZATION WITH CORONARY ANGIOGRAM N/A 03/07/2013   Procedure: LEFT HEART CATHETERIZATION WITH CORONARY ANGIOGRAM;  Surgeon: Burnell Blanks, MD;  Location: [facility] CATH LAB;  Service: Cardiovascular;  Laterality: N/A;   MAZE N/A 04/05/2013   Procedure: MAZE;  Surgeon: Rexene Alberts, MD;  Location: [place];  Service: Open Heart Surgery;  Laterality: N/A;   Polinydal cyst     Removed   POLYPECTOMY     Retina repair-right     RETINAL DETACHMENT SURGERY Right    RD Repair - SB   RIGHT HEART CATHETERIZATION N/A 05/03/2014   Procedure: RIGHT HEART CATH;  Surgeon: Jolaine Artist, MD;  Location: [facility] CATH LAB;  Service: Cardiovascular;  Laterality: N/A;   TEE WITHOUT CARDIOVERSION N/A 08/23/2013   Procedure: TRANSESOPHAGEAL ECHOCARDIOGRAM (TEE);  Surgeon: Sanda Klein,  MD;  Location: [facility] ENDOSCOPY;  Service: Cardiovascular;  Laterality: N/A;   TEE WITHOUT CARDIOVERSION N/A 10/26/2013   Procedure: TRANSESOPHAGEAL ECHOCARDIOGRAM (TEE);  Surgeon: Sueanne Margarita, MD;  Location: [facility] ENDOSCOPY;  Service: Cardiovascular;  Laterality: N/A;   TEE WITHOUT CARDIOVERSION N/A 06/05/2014   Procedure: TRANSESOPHAGEAL ECHOCARDIOGRAM (TEE);  Surgeon: Thayer Headings, MD;  Location: [facility] ENDOSCOPY;  Service: Cardiovascular;  Laterality: N/A;   TRAPEZIUM RESECTION       Current Outpatient Medications  Medication Sig Dispense Refill   acetaminophen (TYLENOL) 500 MG tablet Take 1,000 mg by mouth every 6 (six) hours as needed for moderate pain or headache.      allopurinol (ZYLOPRIM) 300 MG tablet Take 300 mg by mouth daily.     ALPRAZolam (XANAX) 1 MG tablet Take 0.5 tablets (0.5 mg total) by mouth at bedtime as needed for sleep. 60 tablet 0   Alum Hydroxide-Mag Carbonate (GAVISCON PO) Take 4 tablets by mouth daily as needed (acid reflux).      aspirin EC 81 MG tablet Take 81 mg by mouth daily.     atorvastatin (LIPITOR) 20 MG tablet Take 1 tablet 3 x /week 36 tablet 3   azelastine (ASTELIN) 0.1 % nasal spray Place 2 sprays into both nostrils 2 (two) times daily as needed for rhinitis or allergies.     B Complex Vitamins (VITAMIN B COMPLEX PO) Take 1 tablet by mouth at bedtime.      bacitracin 500 UNIT/GM ointment Apply 1 application topically daily as needed for wound care.     Blood Glucose Monitoring Suppl (ACCU-CHEK AVIVA PLUS) w/Device KIT Check blood sugar 1 time  daily 1 kit 0   Blood Glucose Monitoring Suppl DEVI Test blood sugar up to three times a day or as directed. 1 each 0   carvedilol (COREG) 6.25 MG tablet Take 0.5 tablets (3.125 mg total) by mouth in the morning AND 1 tablet (6.25 mg total) every evening. 270 tablet 1   Cholecalciferol (VITAMIN D PO) Take 5,000 Units by mouth daily.      colchicine 0.6 MG tablet Take 0.6 mg by mouth daily as needed  (for gout flare up).      Continuous Blood Gluc Receiver (FREESTYLE LIBRE 14 DAY READER) DEVI 1 Device by  Does not apply route 3 (three) times daily. Dx: E11.22, N18.4, Z79.4 1 each 0   Continuous Blood Gluc Sensor (FREESTYLE LIBRE 14 DAY SENSOR) MISC Check blood sugar 3 times a day. Dx: E11.22, N18.4, Z79.4 2 each 12   diphenhydrAMINE (BENADRYL) 25 MG tablet Take 25 mg by mouth at bedtime as needed for allergies.      ferrous sulfate 325 (65 FE) MG EC tablet Take 325 mg by mouth daily. Take with vitamin C     fluticasone (FLONASE) 50 MCG/ACT nasal spray Place 1 spray into both nostrils daily as needed for allergies. 16 g 3   glucose blood (ACCU-CHEK AVIVA PLUS) test strip CHECK BLOOD GLUCOSE 3 TIMES DAILY. 300 strip 3   insulin NPH-regular Human (70-30) 100 UNIT/ML injection Inject 45 Units into the skin 2 (two) times daily with a meal. 45 units in the morning and 25 units in the evening     lidocaine (LIDODERM) 5 % Place 1 patch onto the skin daily as needed (for pain). Remove & Discard patch within 12 hours or as directed by MD      loratadine (CLARITIN) 10 MG tablet Take 10 mg by mouth daily as needed for allergies.      metolazone (ZAROXOLYN) 2.5 MG tablet TAKE 1 TABLET BY MOUTH  DAILY AS NEEDED FOR  SWELLING 60 tablet 0   Multiple Vitamins-Minerals (PRESERVISION AREDS 2) CAPS Take 1 capsule by mouth 2 (two) times daily.      polyvinyl alcohol (ARTIFICIAL TEARS) 1.4 % ophthalmic solution Place 1 drop into both eyes daily as needed for dry eyes.     potassium chloride SA (KLOR-CON) 20 MEQ tablet Take 2 tablets 3 x /day for potassium 540 tablet 3   sertraline (ZOLOFT) 100 MG tablet Take 1 tablet Daily for Mood 90 tablet 3   torsemide (DEMADEX) 100 MG tablet Take 150 mg by mouth daily.     warfarin (COUMADIN) 5 MG tablet Take 5 mg by mouth daily. 1.5 tablets 4 days a week. Alternating with 1 tablets on MWF (total 9 tabs/week)     No current facility-administered medications for  this encounter.    Allergies:   Sunflower seed [sunflower oil], Horse-derived products, Tetanus toxoids, and Tetanus toxoid   Social History:  The patient  reports that he quit smoking about 38 years ago. His smoking use included cigarettes. He has a 100.00 pack-year smoking history. He has never used smokeless tobacco. He reports current alcohol use of about 1.0 standard drink of alcohol per week. He reports that he does not use drugs.   Family History:  The patient's family history includes Atrial fibrillation in his mother; Colon cancer in his mother; Colon polyps in his mother and sister; Dementia in his father; Diabetes in his maternal uncle; Hypertension in his mother; Stroke in his paternal uncle.   Vitals:   07/03/19 1250  BP: 100/60  Pulse: 77  SpO2: 97%  Weight: 83.5 kg (184 lb)   Weight on 04/27/19 was 189    Exam:  General:  Elderly male. No resp difficulty HEENT: normal Neck: supple. JVP to jaw. Carotids 2+ bilat; no bruits. No lymphadenopathy or thryomegaly appreciated. Cor: PMI nondisplaced. Regular rate & rhythm.2/6 TR Lungs: clear Abdomen: soft, nontender, nondistended. No hepatosplenomegaly. No bruits or masses. Good bowel sounds. Extremities: no cyanosis, clubbing, rash, trace edema Neuro: alert & orientedx3, cranial nerves grossly intact. moves all 4 extremities w/o difficulty. Affect pleasant    Recent Labs: 03/28/2019: Magnesium 2.6; TSH  1.69 04/27/2019: B Natriuretic Peptide 375.2 06/15/2019: ALT 19; BUN 49; Creat 2.49; Hemoglobin 9.5; Platelets 305; Potassium 3.6; Sodium 135  Personally reviewed   Wt Readings from Last 3 Encounters:  07/03/19 83.5 kg (184 lb)  06/15/19 83.5 kg (184 lb)  05/01/19 83.9 kg (185 lb)      ASSESSMENT AND PLAN:  1. Chronic diastolic HF with R>>L symptoms - Progressive NYHA III-IIIb - Volume status looks pretty good on exam but symptomatically worse. ReDS 36%-> 40%  - Continue torsemide 130m daily with metolazone 566monce  a week - Will plan repeat RHC to reassess  - Echo 02/20/19 LVEF 60-65% RV markedly dilated. Mod HK. RVSP 60.7 mmHg. Personally reviewed  2. RV failure & Pulmonary HTN - Has large left to right shunt through large anomalous pulmonary vein into SVC - Have reviewed with TCTS and [facility] way to repair or baffle. Continue medical therapy. - Plan repeat RHC as above - Echo 02/20/19 LVEF 60-65% RV markedly dilated. Mod HK. RVSP 60.7 mmHg.  3. CAD s/p CABG - No s/s ischemia - Continue statin. Off ASA due to warfarin  4. AF s/p Maze  - s/p ablation 10/15  - remains in NSR today  - On coumadin. Has been on DOAC in past and didn't like it because of bleeding  - No recent bleeding. Check labs today  5. CKD, stage 3-4, baseline creatinine 1.7-1.8 - More recently creatinine up to 2.49 on 06/23/19 - Repeat today  6. Anemia - hgb 9.5 on last check - likely contributing to fatigue     Signed, DaGlori BickersMD  07/03/2019 1:03 PM  Advanced [facility] Failure [id number]eart and VaMagnolia [id number]) [phone])

## 2019-07-02 NOTE — H&P (View-Only) (Signed)
°  ° °[facility] Note °. ° °Date:  07/03/2019  ° °ID:  [name]., DOB 02/14/1940, MRN [id number]  Location: Home  °Provider location: [facility] °Type of Visit: Established patient ° °PCP:  McKeown, William, MD  °Cardiologist:  No primary care provider on file. °Primary HF: [name] ° °Chief Complaint: Heart Failure follow-up °  °History of Present Illness: ° °[name] is an 79 y/o male with COPD , DM, PAF, 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, CKD, AFL s/p ablation in 10/15. 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 Had surgery in 4/15. Did well for a while went to cardiac rehab and was feeling fine.  Developed AFL in 10/15 and underwent RFA.  °  °In 3/16 began to develop severe SOB. Started O2. Says his symptoms got worse almost overnight. 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 He has seen Dr. [name] but felt to have no optimal surgical options for repair. His case was also presented to Dr. Bashore at [facility] who agreed that there was no way to baffle or reroute the anomalous PV flow to the LA. He had a TEE which showed LVEF 60-65% with a dilated right side and a small PFO. He has also been seen by Dr. McQuaid who performed PFTs that showed significant restrictive lung disease with a low DLCO. He had f/u with Dr. Fortin in the [facility] who felt his symptoms were multifactorial. °  °Had colonoscopy in 2019 and found to have a polyp and what sounds like AVMs. Subsequently had a laparoscopic removal of appendiceal adenoma which was benign.  °  °Recently struggling with progressive SOB. I felt he was volume overloaded. Torsemide changed to 100/50 and prn metolazone added.  ° °Here for routine f/u. Remains on torsemide 150mg in am (instead of 100/50). Takes metolazone 5mg about once a week. Took one on Saturday and lost 6  pounds. Continues with dyspnea on minimal exertion. Says it is marginally worse. Walks to the mailbox to get the mail and has 3 steps to come up on the way back and he dreads that. Legs very weak. No CP. Edema. Well controlled.  ° ° °Cardiac studies: ° °Echo 02/20/19 LVEF 60-65% RV markedly dilated. Mod HK. RVSP 60.7 mmHg. Personally reviewed °  °Echo 12/18 LVEF 60% RV dilated mildly HK °  ° °PFTs (7/16) °FEV1 1.45 L (45%) °FVC 1.77 L (40%) °DLCO 46% °  °RHC 4/16 °RA = 18 °RV = 72/4/17 °PA = 76/27 (46) °PCW = 21 °Fick cardiac output/index (using PA sat)  = 9.2/4.45 °Thermo CO/CI =  10.0/4.87 °PVR = 2.2 WU °Fick cardiac output/index (using high SVC sat) = 5.2/2.5 °Pulse-ox saturation  = 89%   °High SVC sat = 54% °Low SVC sat = 81% (at SVC/RA junction) °RA sat = 68% °RV sat = 66% °PA sat =  68%, 69% °IVC sat =56%  ° °VQ/CT negative for PE  °  °TEE 10/15 small PFO °  °Ab u/s 6/16 liver normal + ascites. Medico renal kidney disease. °  ° ° ° °[name]. denies symptoms worrisome for COVID 19.  ° °Past Medical History:  °Diagnosis Date  °• Adrenal adenoma   °• Anemia   °• Anxiety   °• Arthritis   °• Atypical atrial flutter (HCC) 8/15, 10/15  ° a. DCCV 08/2013. b. s/p RFA 10/2013.  °• Basal   cell carcinoma   °• CAD (coronary artery disease)   ° a. 04/2013 CABG x 2: LIMA to LAD, SVG to RI, EVH via R thigh.  °• Cellulitis 12/2015  ° left leg  °• Chronic diastolic congestive heart failure (HCC)   °• CKD (chronic kidney disease), stage III   °• Depression   °• Diabetes mellitus type II   °• Diverticulosis 2001  °• DJD (degenerative joint disease)   °• GERD (gastroesophageal reflux disease)   °• Gout   °• H/O hiatal hernia   °• History of cardioversion   ° x3 (years uncertain)  °• Hx of adenomatous colonic polyps   °• Hyperlipidemia   °• Hypertension   °• Hypertensive cardiomyopathy (HCC)   °• Hypertensive retinopathy   ° OU  °• Macular degeneration   ° OU  °• Obstructive sleep apnea   ° compliant with CPAP  °• 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 s/p MAZE 04/2013 in setting of CABG. b. Amio stopped in 10/2013 after flutter ablation.  °• PFO (patent foramen ovale)   ° a. Small PFO by TEE 10/2013.  °• Pleural effusion, left   ° a. s/p thoracentesis 05/2013.  °• Respiratory failure (HCC)   ° a. Hypoxia 10/2013 - required supp O2 as inpatient, did not require it at discharge.  °• S/P Maze operation for atrial fibrillation   ° a. 04/2013: Complete bilateral atrial lesion set using cryothermy and bipolar radiofrequency ablation with clipping of LA appendage (@ time of CABG)  °• S/P Maze operation for atrial fibrillation 04/05/2013  ° Complete bilateral atrial lesion set using cryothermy and bipolar radiofrequency ablation with clipping of LA appendage via median sternotomy approach   ° °Past Surgical History:  °Procedure Laterality Date  °• APPENDECTOMY  03/05/2017  ° laproscopic  °• ATRIAL FIBRILLATION ABLATION N/A 10/26/2013  ° Procedure: ATRIAL FIBRILLATION ABLATION;  Surgeon: James D Allred, MD;  Location: [facility];  Service: Cardiovascular;  Laterality: N/A;  °• BASAL CELL CARCINOMA EXCISION    ° x3 on face  °• CARDIAC CATHETERIZATION    ° myocardial bridge but no cad  °• CARDIOVERSION N/A 08/23/2013  ° Procedure: CARDIOVERSION;  Surgeon: Mihai Croitoru, MD;  Location: [facility];  Service: Cardiovascular;  Laterality: N/A;  °• CARPOMETACARPEL SUSPENSION PLASTY Left 02/14/2014  ° Procedure: CARPOMETACARPEL (CMC) SUSPENSIONPLASTY THUMB  WITH  ABDUCTOR POLLICIS LONGUS TRANSFER AND STENOSING TENOSYNOVITIS RELEASE LEFT WRIST;  Surgeon: Matthew Weingold, MD;  Location: [facility];  Service: Orthopedics;  Laterality: Left;  °• CATARACT EXTRACTION Bilateral   °• COLONOSCOPY WITH PROPOFOL N/A 01/26/2017  ° Procedure: COLONOSCOPY WITH PROPOFOL;  Surgeon: Danis, Jajuan L III,  MD;  Location: [facility];  Service: Gastroenterology;  Laterality: N/A;  °• CORONARY ARTERY BYPASS GRAFT N/A 04/05/2013  ° Procedure: CORONARY ARTERY BYPASS GRAFTING (CABG) TIMES TWO USING LEFT INTERNAL MAMMARY ARTERY AND RIGHT SAPHENOUS LEG VEIN HARVESTED ENDOSCOPICALLY;  Surgeon: [name] [name], MD;  Location: [facility];  Service: Open Heart Surgery;  Laterality: N/A;  °• ESOPHAGOGASTRODUODENOSCOPY (EGD) WITH PROPOFOL N/A 01/26/2017  ° Procedure: ESOPHAGOGASTRODUODENOSCOPY (EGD) WITH PROPOFOL;  Surgeon: Danis, Rishikesh L III, MD;  Location: [facility];  Service: Gastroenterology;  Laterality: N/A;  °• EYE SURGERY Bilateral   ° Cat Sx  °• EYE SURGERY Right   ° RD repair - SB  °• GREAT TOE   ARTHRODESIS, INTERPHALANGEAL JOINT    ° Right foot  °• INTRAOPERATIVE TRANSESOPHAGEAL ECHOCARDIOGRAM N/A 04/05/2013  ° Procedure: INTRAOPERATIVE TRANSESOPHAGEAL ECHOCARDIOGRAM;  Surgeon: [name] [name], MD;  Location: [facility];  Service: Open Heart Surgery;  Laterality: N/A;  °• LAPAROSCOPIC APPENDECTOMY N/A 03/05/2017  ° Procedure: APPENDECTOMY LAPAROSCOPIC;  Surgeon: Wyatt, James, MD;  Location: [facility];  Service: General;  Laterality: N/A;  °• LEFT HEART CATHETERIZATION WITH CORONARY ANGIOGRAM N/A 03/07/2013  ° Procedure: LEFT HEART CATHETERIZATION WITH CORONARY ANGIOGRAM;  Surgeon: Christopher D McAlhany, MD;  [facility]: [facility];  Service: Cardiovascular;  Laterality: N/A;  °• MAZE N/A 04/05/2013  ° Procedure: MAZE;  Surgeon: [name] [name], MD;  [facility]: [facility];  Service: Open Heart Surgery;  Laterality: N/A;  °• Polinydal cyst    ° Removed  °• POLYPECTOMY    °• Retina repair-right    °• RETINAL DETACHMENT SURGERY Right   ° RD Repair - SB  °• RIGHT HEART CATHETERIZATION N/A 05/03/2014  ° Procedure: RIGHT HEART CATH;  Surgeon: Sebrena Engh R Wenzel Backlund, MD;  [facility]: [facility];  Service: Cardiovascular;  Laterality: N/A;  °• TEE WITHOUT CARDIOVERSION N/A 08/23/2013  ° Procedure: TRANSESOPHAGEAL ECHOCARDIOGRAM (TEE);  Surgeon: Mihai Croitoru,  MD;  [facility];  Service: Cardiovascular;  Laterality: N/A;  °• TEE WITHOUT CARDIOVERSION N/A 10/26/2013  ° Procedure: TRANSESOPHAGEAL ECHOCARDIOGRAM (TEE);  Surgeon: Traci R Turner, MD;  [facility];  Service: Cardiovascular;  Laterality: N/A;  °• TEE WITHOUT CARDIOVERSION N/A 06/05/2014  ° Procedure: TRANSESOPHAGEAL ECHOCARDIOGRAM (TEE);  Surgeon: Philip J Nahser, MD;  Location: [facility];  Service: Cardiovascular;  Laterality: N/A;  °• TRAPEZIUM RESECTION    ° ° ° °Current Outpatient Medications  °Medication Sig Dispense Refill  °• acetaminophen (TYLENOL) 500 MG tablet Take 1,000 mg by mouth every 6 (six) hours as needed for moderate pain or headache.     °• allopurinol (ZYLOPRIM) 300 MG tablet Take 300 mg by mouth daily.    °• ALPRAZolam (XANAX) 1 MG tablet Take 0.5 tablets (0.5 mg total) by mouth at bedtime as needed for sleep. 60 tablet 0  °• Alum Hydroxide-Mag Carbonate (GAVISCON PO) Take 4 tablets by mouth daily as needed (acid reflux).     °• aspirin EC 81 MG tablet Take 81 mg by mouth daily.    °• atorvastatin (LIPITOR) 20 MG tablet Take 1 tablet 3 x /week 36 tablet 3  °• azelastine (ASTELIN) 0.1 % nasal spray Place 2 sprays into both nostrils 2 (two) times daily as needed for rhinitis or allergies.    °• B Complex Vitamins (VITAMIN B COMPLEX PO) Take 1 tablet by mouth at bedtime.     °• bacitracin 500 UNIT/GM ointment Apply 1 application topically daily as needed for wound care.    °• Blood Glucose Monitoring Suppl (ACCU-CHEK AVIVA PLUS) w/Device KIT Check blood sugar 1 time  daily 1 kit 0  °• Blood Glucose Monitoring Suppl DEVI Test blood sugar up to three times a day or as directed. 1 each 0  °• carvedilol (COREG) 6.25 MG tablet Take 0.5 tablets (3.125 mg total) by mouth in the morning AND 1 tablet (6.25 mg total) every evening. 270 tablet 1  °• Cholecalciferol (VITAMIN D PO) Take 5,000 Units by mouth daily.     °• colchicine 0.6 MG tablet Take 0.6 mg by mouth daily as needed  (for gout flare up).     °• Continuous Blood Gluc Receiver (FREESTYLE LIBRE 14 DAY READER) DEVI 1 Device by   Does not apply route 3 (three) times daily. Dx: E11.22, N18.4, Z79.4 1 each 0  °• Continuous Blood Gluc Sensor (FREESTYLE LIBRE 14 DAY SENSOR) MISC Check blood sugar 3 times a day. Dx: E11.22, N18.4, Z79.4 2 each 12  °• diphenhydrAMINE (BENADRYL) 25 MG tablet Take 25 mg by mouth at bedtime as needed for allergies.     °• ferrous sulfate 325 (65 FE) MG EC tablet Take 325 mg by mouth daily. Take with vitamin C    °• fluticasone (FLONASE) 50 MCG/ACT nasal spray Place 1 spray into both nostrils daily as needed for allergies. 16 g 3  °• glucose blood (ACCU-CHEK AVIVA PLUS) test strip CHECK BLOOD GLUCOSE 3 TIMES DAILY. 300 strip 3  °• insulin NPH-regular Human (70-30) 100 UNIT/ML injection Inject 45 Units into the skin 2 (two) times daily with a meal. 45 units in the morning and 25 units in the evening    °• lidocaine (LIDODERM) 5 % Place 1 patch onto the skin daily as needed (for pain). Remove & Discard patch within 12 hours or as directed by MD     °• loratadine (CLARITIN) 10 MG tablet Take 10 mg by mouth daily as needed for allergies.     °• metolazone (ZAROXOLYN) 2.5 MG tablet TAKE 1 TABLET BY MOUTH  DAILY AS NEEDED FOR  SWELLING 60 tablet 0  °• Multiple Vitamins-Minerals (PRESERVISION AREDS 2) CAPS Take 1 capsule by mouth 2 (two) times daily.     °• polyvinyl alcohol (ARTIFICIAL TEARS) 1.4 % ophthalmic solution Place 1 drop into both eyes daily as needed for dry eyes.    °• potassium chloride SA (KLOR-CON) 20 MEQ tablet Take 2 tablets 3 x /day for potassium 540 tablet 3  °• sertraline (ZOLOFT) 100 MG tablet Take 1 tablet Daily for Mood 90 tablet 3  °• torsemide (DEMADEX) 100 MG tablet Take 150 mg by mouth daily.    °• warfarin (COUMADIN) 5 MG tablet Take 5 mg by mouth daily. 1.5 tablets 4 days a week. Alternating with 1 tablets on MWF (total 9 tabs/week)    ° °No current facility-administered medications for  this encounter.  ° ° °Allergies:   Sunflower seed [sunflower oil], Horse-derived products, Tetanus toxoids, and Tetanus toxoid  ° °Social History:  The patient  reports that he quit smoking about 38 years ago. His smoking use included cigarettes. He has a 100.00 pack-year smoking history. He has never used smokeless tobacco. He reports current alcohol use of about 1.0 standard drink of alcohol per week. He reports that he does not use drugs.  ° °Family History:  The patient's family history includes Atrial fibrillation in his mother; Colon cancer in his mother; Colon polyps in his mother and sister; Dementia in his father; Diabetes in his maternal uncle; Hypertension in his mother; Stroke in his paternal uncle.  ° °Vitals:  ° 07/03/19 1250  °BP: 100/60  °Pulse: 77  °SpO2: 97%  °Weight: 83.5 kg (184 lb)  ° °Weight on 04/27/19 was 189  ° ° °Exam:  °General:  Elderly male. No resp difficulty °HEENT: normal °Neck: supple. JVP to jaw. Carotids 2+ bilat; no bruits. No lymphadenopathy or thryomegaly appreciated. °Cor: PMI nondisplaced. Regular rate & rhythm.2/6 TR °Lungs: clear °Abdomen: soft, nontender, nondistended. No hepatosplenomegaly. No bruits or masses. Good bowel sounds. °Extremities: no cyanosis, clubbing, rash, trace edema °Neuro: alert & orientedx3, cranial nerves grossly intact. moves all 4 extremities w/o difficulty. Affect pleasant ° ° ° °Recent Labs: °03/28/2019: Magnesium 2.6; TSH   1.69 °04/27/2019: B Natriuretic Peptide 375.2 °06/15/2019: ALT 19; BUN 49; Creat 2.49; Hemoglobin 9.5; Platelets 305; Potassium 3.6; Sodium 135  °Personally reviewed  ° °Wt Readings from Last 3 Encounters:  °07/03/19 83.5 kg (184 lb)  °06/15/19 83.5 kg (184 lb)  °05/01/19 83.9 kg (185 lb)  °  ° ° °ASSESSMENT AND PLAN: ° °1. Chronic diastolic HF with R>>L symptoms °- Progressive NYHA III-IIIb °- Volume status looks pretty good on exam but symptomatically worse. ReDS 36%-> 40%  °- Continue torsemide 150mg daily with metolazone 5mg once  a week °- Will plan repeat RHC to reassess  °- Echo 02/20/19 LVEF 60-65% RV markedly dilated. Mod HK. RVSP 60.7 mmHg. Personally reviewed °  °2. RV failure & Pulmonary HTN °- Has large left to right shunt through large anomalous pulmonary vein into SVC °- Have reviewed with T[facility] and Dr. Bashore at [facility]. No way to repair or baffle. Continue medical therapy. °- Plan repeat RHC as above °- Echo 02/20/19 LVEF 60-65% RV markedly dilated. Mod HK. RVSP 60.7 mmHg. °  °3. CAD s/p CABG °- No s/s ischemia °- Continue statin. Off ASA due to warfarin °  °4. AF s/p Maze ° - s/p ablation 10/15 ° - remains in NSR today ° - On coumadin. Has been on DOAC in past and didn't like it because of bleeding ° - No recent bleeding. Check labs today ° °5. CKD, stage 3-4, baseline creatinine 1.7-1.8 °- More recently creatinine up to 2.49 on 06/23/19 °- Repeat today °  °6. Anemia °- hgb 9.5 on last check °- likely contributing to fatigue  ° ° ° °Signed, °Myrta Mercer, MD  °07/03/2019 °1:03 PM ° °[facility] °[place] °[address] °[facility] °[place] [address] °[phone] (office) °[phone] (fax) ° °

## 2019-07-03 ENCOUNTER — Telehealth (HOSPITAL_COMMUNITY): Payer: Self-pay | Admitting: *Deleted

## 2019-07-03 ENCOUNTER — Telehealth (HOSPITAL_COMMUNITY): Payer: Self-pay

## 2019-07-03 ENCOUNTER — Other Ambulatory Visit: Payer: Self-pay

## 2019-07-03 ENCOUNTER — Encounter (HOSPITAL_COMMUNITY): Payer: Self-pay | Admitting: Internal Medicine

## 2019-07-03 ENCOUNTER — Ambulatory Visit (HOSPITAL_COMMUNITY)
Admission: RE | Admit: 2019-07-03 | Discharge: 2019-07-03 | Disposition: A | Discharge status: Home / Self Care | Payer: Medicare Other | Source: Ambulatory Visit | Attending: Internal Medicine | Admitting: Internal Medicine

## 2019-07-03 VITALS — BP 100/60 | HR 77 | Wt 184.0 lb

## 2019-07-03 DIAGNOSIS — D649 Anemia, unspecified: Secondary | ICD-10-CM | POA: Insufficient documentation

## 2019-07-03 DIAGNOSIS — E785 Hyperlipidemia, unspecified: Secondary | ICD-10-CM | POA: Diagnosis not present

## 2019-07-03 DIAGNOSIS — Z86010 Personal history of colonic polyps: Secondary | ICD-10-CM | POA: Diagnosis not present

## 2019-07-03 DIAGNOSIS — Z951 Presence of aortocoronary bypass graft: Secondary | ICD-10-CM | POA: Insufficient documentation

## 2019-07-03 DIAGNOSIS — I272 Pulmonary hypertension, unspecified: Secondary | ICD-10-CM

## 2019-07-03 DIAGNOSIS — R188 Other ascites: Secondary | ICD-10-CM | POA: Insufficient documentation

## 2019-07-03 DIAGNOSIS — M1990 Unspecified osteoarthritis, unspecified site: Secondary | ICD-10-CM | POA: Insufficient documentation

## 2019-07-03 DIAGNOSIS — I4892 Unspecified atrial flutter: Secondary | ICD-10-CM | POA: Insufficient documentation

## 2019-07-03 DIAGNOSIS — Z87891 Personal history of nicotine dependence: Secondary | ICD-10-CM | POA: Diagnosis not present

## 2019-07-03 DIAGNOSIS — E1122 Type 2 diabetes mellitus with diabetic chronic kidney disease: Secondary | ICD-10-CM | POA: Diagnosis not present

## 2019-07-03 DIAGNOSIS — K219 Gastro-esophageal reflux disease without esophagitis: Secondary | ICD-10-CM | POA: Insufficient documentation

## 2019-07-03 DIAGNOSIS — Z794 Long term (current) use of insulin: Secondary | ICD-10-CM | POA: Diagnosis not present

## 2019-07-03 DIAGNOSIS — G4733 Obstructive sleep apnea (adult) (pediatric): Secondary | ICD-10-CM | POA: Insufficient documentation

## 2019-07-03 DIAGNOSIS — Z7901 Long term (current) use of anticoagulants: Secondary | ICD-10-CM | POA: Insufficient documentation

## 2019-07-03 DIAGNOSIS — J449 Chronic obstructive pulmonary disease, unspecified: Secondary | ICD-10-CM | POA: Insufficient documentation

## 2019-07-03 DIAGNOSIS — I480 Paroxysmal atrial fibrillation: Secondary | ICD-10-CM

## 2019-07-03 DIAGNOSIS — Z8249 Family history of ischemic heart disease and other diseases of the circulatory system: Secondary | ICD-10-CM | POA: Insufficient documentation

## 2019-07-03 DIAGNOSIS — N1832 Chronic kidney disease, stage 3b: Secondary | ICD-10-CM

## 2019-07-03 DIAGNOSIS — Z85828 Personal history of other malignant neoplasm of skin: Secondary | ICD-10-CM | POA: Diagnosis not present

## 2019-07-03 DIAGNOSIS — Z79899 Other long term (current) drug therapy: Secondary | ICD-10-CM | POA: Insufficient documentation

## 2019-07-03 DIAGNOSIS — I2510 Atherosclerotic heart disease of native coronary artery without angina pectoris: Secondary | ICD-10-CM | POA: Diagnosis not present

## 2019-07-03 DIAGNOSIS — N184 Chronic kidney disease, stage 4 (severe): Secondary | ICD-10-CM | POA: Insufficient documentation

## 2019-07-03 DIAGNOSIS — I5032 Chronic diastolic (congestive) heart failure: Secondary | ICD-10-CM | POA: Diagnosis not present

## 2019-07-03 DIAGNOSIS — M109 Gout, unspecified: Secondary | ICD-10-CM | POA: Diagnosis not present

## 2019-07-03 DIAGNOSIS — F419 Anxiety disorder, unspecified: Secondary | ICD-10-CM | POA: Insufficient documentation

## 2019-07-03 DIAGNOSIS — I130 Hypertensive heart and chronic kidney disease with heart failure and stage 1 through stage 4 chronic kidney disease, or unspecified chronic kidney disease: Secondary | ICD-10-CM | POA: Diagnosis not present

## 2019-07-03 DIAGNOSIS — Z7982 Long term (current) use of aspirin: Secondary | ICD-10-CM | POA: Insufficient documentation

## 2019-07-03 DIAGNOSIS — Z833 Family history of diabetes mellitus: Secondary | ICD-10-CM | POA: Insufficient documentation

## 2019-07-03 DIAGNOSIS — Q211 Atrial septal defect: Secondary | ICD-10-CM | POA: Diagnosis not present

## 2019-07-03 LAB — CBC
HCT: 33.3 % — ABNORMAL LOW (ref 39.0–52.0)
Hemoglobin: 9.5 g/dL — ABNORMAL LOW (ref 13.0–17.0)
MCH: 25.4 pg — ABNORMAL LOW (ref 26.0–34.0)
MCHC: 28.5 g/dL — ABNORMAL LOW (ref 30.0–36.0)
MCV: 89 fL (ref 80.0–100.0)
Platelets: 260 10*3/uL (ref 150–400)
RBC: 3.74 MIL/uL — ABNORMAL LOW (ref 4.22–5.81)
RDW: 18.5 % — ABNORMAL HIGH (ref 11.5–15.5)
WBC: 10 10*3/uL (ref 4.0–10.5)
nRBC: 0 % (ref 0.0–0.2)

## 2019-07-03 LAB — PROTIME-INR
INR: 2.8 — ABNORMAL HIGH (ref 0.8–1.2)
Prothrombin Time: 28.7 seconds — ABNORMAL HIGH (ref 11.4–15.2)

## 2019-07-03 LAB — COMPREHENSIVE METABOLIC PANEL
ALT: 23 U/L (ref 0–44)
AST: 21 U/L (ref 15–41)
Albumin: 3.8 g/dL (ref 3.5–5.0)
Alkaline Phosphatase: 112 U/L (ref 38–126)
Anion gap: 12 (ref 5–15)
BUN: 35 mg/dL — ABNORMAL HIGH (ref 8–23)
CO2: 32 mmol/L (ref 22–32)
Calcium: 9.1 mg/dL (ref 8.9–10.3)
Chloride: 94 mmol/L — ABNORMAL LOW (ref 98–111)
Creatinine, Ser: 2.05 mg/dL — ABNORMAL HIGH (ref 0.61–1.24)
GFR calc Af Amer: 34 mL/min — ABNORMAL LOW (ref >60)
GFR calc non Af Amer: 30 mL/min — ABNORMAL LOW (ref >60)
Glucose, Bld: 225 mg/dL — ABNORMAL HIGH (ref 70–99)
Potassium: 3.1 mmol/L — ABNORMAL LOW (ref 3.5–5.1)
Sodium: 138 mmol/L (ref 135–145)
Total Bilirubin: 0.6 mg/dL (ref 0.3–1.2)
Total Protein: 6.9 g/dL (ref 6.5–8.1)

## 2019-07-03 LAB — BRAIN NATRIURETIC PEPTIDE: B Natriuretic Peptide: 264.2 pg/mL — ABNORMAL HIGH (ref 0.0–100.0)

## 2019-07-03 MED ORDER — POTASSIUM CHLORIDE CRYS ER 20 MEQ PO TBCR: EXTENDED_RELEASE_TABLET | ORAL | 3 refills | Status: DC

## 2019-07-03 NOTE — Telephone Encounter (Signed)
-----   Message from Jolaine Artist, MD sent at 07/03/2019  4:07 PM EDT ----- Have him take 80kcl today extra. Increase daily potassium by 33meq. Recheck 2 weeks.

## 2019-07-03 NOTE — Telephone Encounter (Signed)
Pt aware, agreeable, and verbalized understanding, repeat lab sch for 7/12, med list updated

## 2019-07-03 NOTE — Telephone Encounter (Signed)
Pt insurance is active and benefits verified through [facility] Medicare Co-pay $20, DED 0/0 met, out of pocket $4,500/$190.03 met, co-insurance 0%. no pre-authorization required. Passport, [place] 07/03/2019@3 :08pm, REF# [id number]

## 2019-07-03 NOTE — Patient Instructions (Addendum)
Labs done today, your results will be available in MyChart, we will contact you for abnormal readings.  You have been referred to Pulmonary Rehab, they will call you to schedule  Heart Catheterization on Thursday 7/1, see instructions below  Please call our office in October to schedule your follow up appointment  If you have any questions or concerns before your next appointment please send [place] a message through [place] or call our office at [phone].    TO LEAVE A MESSAGE FOR THE NURSE SELECT OPTION 2, PLEASE LEAVE A MESSAGE INCLUDING: . YOUR NAME . DATE OF BIRTH . CALL BACK NUMBER . REASON FOR CALL**this is important as we prioritize the call backs  YOU WILL RECEIVE A CALL BACK THE SAME DAY AS LONG AS YOU CALL BEFORE 4:00 PM   HEART CATHETERIZATION INSTRUCTIONS:  You are scheduled for a Cardiac Catheterization on Thursday, July 1 with Dr. Glori Bickers.  1. Please arrive at the [facility] (Main Entrance A) at [facility]: [address] [place], [place] [address] at 7:00 AM (This time is two hours before your procedure to ensure your preparation). Free valet parking service is available.   Special note: Every effort is made to have your procedure done on time. Please understand that emergencies sometimes delay scheduled procedures.  2. Diet: Do not eat solid foods after midnight.  The patient may have clear liquids until 5am upon the day of the procedure.  3. Labs: DONE TODAY  4. Medication instructions in preparation for your procedure:    WED 6/30 PM ONLY TAKE 12 UNITS OF INSULIN AND DO NOT TAKE COUMADIN   THUR 7/1 AM DO NOT TAKE INSULIN OR TORSEMIDE    On the morning of your procedure, take your Aspirin and any morning medicines NOT listed above.  You may use sips of water.  5. Plan for one night stay--bring personal belongings. 6. Bring a current list of your medications and current insurance cards. 7. You MUST have a responsible person to drive you  home. 8. Someone MUST be with you the first 24 hours after you arrive home or your discharge will be delayed. 9. Please wear clothes that are easy to get on and off and wear slip-on shoes.  Thank you for allowing [place] to care for you!   -- [place] [facility]

## 2019-07-03 NOTE — Progress Notes (Signed)
Assessment and Plan:  Anemia Has CKD- ? Anemia of chronic disease Has follow up with Dr. Irene Limbo tomorrow- having some itching without rash, worse with hot shower, no diaphoresis or weight loss Had GI work up in 2019 with history of AVM- may need to follow up thought hemoccult negative, DRE negative.   Dizziness Suggest wearing compression socks Find out cause and correct anemia  PAF (paroxysmal atrial fibrillation) (HCC) Check INR and will adjust medication according to labs in 1 week.  Discussed if patient falls to immediately contact office or go to ER. Discussed foods that can increase or decrease Coumadin levels.  Chronic diastolic congestive heart failure (HCC) Encouraged daily monitoring of the patient's weight Continue follow up with cardiology  Senile purpura ([place]) Discussed process, protect skin, sunscreen  CKD stage 3 due to type 2 diabetes mellitus ([place]) Monitor- check with cardiology- continue to monitor kidney function  Chronic anticoagulation -   Checked yesterday still at 2.8 - new goal between 2-2.5- will changed to 1.5 3 days a week and 1 pill 4 days a week, follow up 1 week   Further disposition pending results of labs. Discussed med's effects and SE's.   Over 30 minutes of exam, counseling, chart review, and critical decision making was performed.   Future Appointments  Date Time Provider [place]  07/05/2019  1:00 PM Brunetta Genera, MD [facility] None  07/05/2019  1:45 PM [facility] 2 [facility] None  07/07/2019  3:00 PM [facility] VASC 2 [facility]  07/17/2019 12:00 PM [facility] LAB [facility] None  09/01/2019 12:30 PM Hayden Pedro, MD [facility] None  10/11/2019 10:00 AM [name], MD [facility] None    ------------------------------------------------------------------------------------------------------------------   HPI BP 120/88   Pulse 86   Temp (!) 97.5 F (36.4 C)   Wt 186 lb (84.4 kg)   SpO2 95%   BMI 25.94 kg/m    79 y.o.male presents for follow up on coumadin for p. A. fib, CHF, htn, CKD III/IV.   He reports his dizziness has improved but occ will still have dizziness when he stands or starts to move, he is standing for a while before moving and states this helps.  He endorses dyspnea, has been worse, following with Dr. Missy Sabins, getting [place] 07/06/2019. Just finished metalozone in addition to his Torsemid 159m and has lost 7-8 lbs according to the patient but he has not lost any weight according to our scales. .   Cardiology is treating carvedilol 6.25 mg - takes 1/2 tab BID due to hypotension   Wt Readings from Last 3 Encounters:  07/04/19 186 lb (84.4 kg)  07/03/19 184 lb (83.5 kg)  06/15/19 184 lb (83.5 kg)   Patient is on Coumadin for PAF (paroxysmal atrial fibrillation) (HCC) [I48.0] Patient's last INR is  Lab Results  Component Value Date   INR 2.8 (H) 07/03/2019   INR 2.8 (H) 06/29/2019   INR 3.0 (H) 06/15/2019    Patient denies CP,  and blood in stool/urine.  His coumadin dose was changed last visit due to acute vitreous hemorrhage of his right eye, his new goal is 2-2.5.  Current dose: changed to 1.5 tabs 4 days a week, alternating with 1 tab on MWF (total 9 tabs/week)  He has not taken ABX, has not missed any doses. Denies falls.  HE HAD HIS INR CHECKED YESTERDAY AND IT WAS STILL AT 2.8.  Lab Results  Component Value Date   HGB 9.5 (L) 07/03/2019   HGB 9.5 (  L) 06/15/2019   HGB 8.8 (L) 06/12/2019   Also his HGB dropped as low as 8.8 but has been stable at 9.5. He has very mild iron def, hemoccult x 3 was negative. He has a follow up with Dr. [name] tomorrow.  He had similar insidenous in 2019 with iron def, had EGD with Dr. 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 . Colonoscopy showed polyp in appendix, was unable to resect so underwent appendectomy, had non-bleeding diverticula as well.   Had remained on iron supplement which he  reported caused constipation so has not done as much.   He complains of sharp/burning itch on his scalp and bilateral arms, has been using DM dry skin gold bond and taking colder showers which helps. No night sweats, no rash.    Lab Results  Component Value Date   IRON 99 05/01/2019   TIBC 454 (H) 05/01/2019   FERRITIN 41 05/01/2019   Lab Results  Component Value Date   GFRNONAA 30 (L) 07/03/2019     Past Medical History:  Diagnosis Date  . Adrenal adenoma   . Anemia   . Anxiety   . Arthritis   . Atypical atrial flutter ([place]) 8/15, 10/15   a. DCCV 08/2013. b. s/p RFA 10/2013.  [name] Basal cell carcinoma   . CAD (coronary artery disease)    a. 04/2013 CABG x 2: LIMA to LAD, SVG to RI, EVH via R thigh.  . Cellulitis 12/2015   left leg  . Chronic diastolic congestive heart failure ([place])   . CKD (chronic kidney disease), stage III   . Depression   . Diabetes mellitus type II   . Diverticulosis 2001  . DJD (degenerative joint disease)   . GERD (gastroesophageal reflux disease)   . Gout   . H/O hiatal hernia   . History of cardioversion    x3 (years uncertain)  . Hx of adenomatous colonic polyps   . Hyperlipidemia   . Hypertension   . Hypertensive cardiomyopathy ([place])   . Hypertensive retinopathy    OU  . Macular degeneration    OU  . Obstructive sleep apnea    compliant with CPAP  . 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 Persistent atrial fibrillation ([place])    a. s/p MAZE 04/2013 in setting of CABG. b. Amio stopped in 10/2013 after flutter ablation.  [name] PFO (patent foramen ovale)    a. Small PFO by TEE 10/2013.  [name] Pleural effusion, left    a. s/p thoracentesis 05/2013.  [name] Respiratory failure ([place])    a. Hypoxia 10/2013 - required supp O2 as inpatient, did not require it at discharge.  . S/P Maze operation for atrial fibrillation    a. 04/2013: Complete bilateral  atrial lesion set using cryothermy and bipolar radiofrequency ablation with clipping of LA appendage (@ time of CABG)  . S/P Maze operation for atrial fibrillation 04/05/2013   Complete bilateral atrial lesion set using cryothermy and bipolar radiofrequency ablation with clipping of LA appendage via median sternotomy approach      Allergies  Allergen Reactions  . Sunflower Seed [Sunflower Oil] Swelling and Other (See Comments)    Tongue and lip swelling  . Horse-Derived Products Other (See Comments)    Per allergy skin test UNSPECIFIED REACTION   . Tetanus Toxoids Other (See Comments)    Per allergy skin test  .  Tetanus Toxoid     Other reaction(s): Other (See Comments) Rash(horse serum)     Current Outpatient Medications (Endocrine & Metabolic):  .  insulin NPH-regular Human (70-30) 100 UNIT/ML injection, Inject 25-45 Units into the skin See admin instructions. 45 units in the morning and 25 units in the evening   Current Outpatient Medications (Cardiovascular):  .  atorvastatin (LIPITOR) 20 MG tablet, Take 1 tablet 3 x /week (Patient taking differently: Take 20 mg by mouth 3 (three) times a week. Take 1 tablet 3 x /week) .  carvedilol (COREG) 6.25 MG tablet, Take 0.5 tablets (3.125 mg total) by mouth in the morning AND 1 tablet (6.25 mg total) every evening. .  metolazone (ZAROXOLYN) 2.5 MG tablet, TAKE 1 TABLET BY MOUTH  DAILY AS NEEDED FOR  SWELLING (Patient taking differently: Take 2.5 mg by mouth daily as needed (swelling). ) .  torsemide (DEMADEX) 100 MG tablet, Take 150 mg by mouth daily.  Current Outpatient Medications (Respiratory):  .  azelastine (ASTELIN) 0.1 % nasal spray, Place 2 sprays into both nostrils 2 (two) times daily as needed for rhinitis or allergies. .  diphenhydrAMINE (BENADRYL) 25 MG tablet, Take 25 mg by mouth at bedtime as needed for allergies.  .  fluticasone (FLONASE) 50 MCG/ACT nasal spray, Place 1 spray into both nostrils daily as needed for  allergies. [name]  loratadine (CLARITIN) 10 MG tablet, Take 10 mg by mouth daily as needed for allergies.   Current Outpatient Medications (Analgesics):  .  acetaminophen (TYLENOL) 500 MG tablet, Take 1,000 mg by mouth every 6 (six) hours as needed for moderate pain or headache.  .  allopurinol (ZYLOPRIM) 300 MG tablet, Take 300 mg by mouth daily. [name]  aspirin EC 81 MG tablet, Take 81 mg by mouth daily. .  colchicine 0.6 MG tablet, Take 0.6 mg by mouth daily as needed (for gout flare up).   Current Outpatient Medications (Hematological):  .  ferrous sulfate 325 (65 FE) MG EC tablet, Take 325 mg by mouth 3 (three) times a week. Take with vitamin C  .  warfarin (COUMADIN) 5 MG tablet, Take 5-7.5 mg by mouth See admin instructions. 1.5 tablets 4 days a week. Alternating with 1 tablets on MWF (total 9 tabs/week)   Current Outpatient Medications (Other):  [name]  ALPRAZolam (XANAX) 1 MG tablet, Take 0.5 tablets (0.5 mg total) by mouth at bedtime as needed for sleep. [name]  Alum Hydroxide-Mag Carbonate (GAVISCON PO), Take 4 tablets by mouth daily as needed (acid reflux).  .  B Complex Vitamins (VITAMIN B COMPLEX PO), Take 1 tablet by mouth at bedtime.  .  bacitracin 500 UNIT/GM ointment, Apply 1 application topically daily as needed for wound care. .  Blood Glucose Monitoring Suppl (ACCU-CHEK AVIVA PLUS) w/Device KIT, Check blood sugar 1 time  daily .  Blood Glucose Monitoring Suppl DEVI, Test blood sugar up to three times a day or as directed. .  Cholecalciferol (VITAMIN D PO), Take 5,000 Units by mouth daily.  .  Continuous Blood Gluc Receiver (FREESTYLE LIBRE 14 DAY READER) DEVI, 1 Device by Does not apply route 3 (three) times daily. Dx: E11.22, N18.4, Z79.4 .  Continuous Blood Gluc Sensor (FREESTYLE LIBRE 14 DAY SENSOR) MISC, Check blood sugar 3 times a day. Dx: E11.22, N18.4, Z79.4 .  glucose blood (ACCU-CHEK AVIVA PLUS) test strip, CHECK BLOOD GLUCOSE 3 TIMES DAILY. [name]  lidocaine (LIDODERM) 5 %, Place 1  patch onto the skin daily as needed (for pain). Remove &  Discard patch within 12 hours or as directed by MD  .  Multiple Vitamins-Minerals (PRESERVISION AREDS 2) CAPS, Take 1 capsule by mouth 2 (two) times daily.  .  polyvinyl alcohol (ARTIFICIAL TEARS) 1.4 % ophthalmic solution, Place 1 drop into both eyes daily as needed for dry eyes. .  potassium chloride SA (KLOR-CON) 20 MEQ tablet, Take 3 tablets (60 mEq total) by mouth in the morning AND 2 tablets (40 mEq total) daily after lunch AND 2 tablets (40 mEq total) every evening. .  sertraline (ZOLOFT) 100 MG tablet, Take 1 tablet Daily for Mood (Patient taking differently: Take 100 mg by mouth daily. Take 1 tablet Daily for Mood)  ROS: Review of Systems  Constitutional: Positive for malaise/fatigue. Negative for chills, diaphoresis, fever and weight loss.  HENT: Negative for congestion, hearing loss and tinnitus.   Eyes: Negative for blurred vision and double vision.  Respiratory: Positive for shortness of breath. Negative for cough, hemoptysis, sputum production and wheezing.   Cardiovascular: Negative for chest pain, palpitations, orthopnea, claudication, leg swelling and PND.  Gastrointestinal: Negative for abdominal pain, blood in stool, constipation, diarrhea, heartburn, melena, nausea and vomiting.  Genitourinary: Negative.   Musculoskeletal: Negative for joint pain and myalgias.  Skin: Positive for itching (worse with hot shower, better with lotion). Negative for rash.  Neurological: Negative for dizziness, tingling, sensory change, weakness and headaches.  Endo/Heme/Allergies: Negative for polydipsia. Bruises/bleeds easily.  Psychiatric/Behavioral: Negative.   All other systems reviewed and are negative.    Physical Exam:  BP 120/88   Pulse 86   Temp (!) 97.5 F (36.4 C)   Wt 186 lb (84.4 kg)   SpO2 95%   BMI 25.94 kg/m   General Appearance: Chronically ill appearing elderly male, well dressed, in no apparent  distress. Eyes: PERRLA, EOMs, conjunctiva no swelling or erythema Sinuses: No Frontal/maxillary tenderness ENT/Mouth: Ext aud canals clear, TMs without erythema, bulging. No erythema, swelling, or exudate on post pharynx.  Tonsils not swollen or erythematous. Hearing normal.  Neck: Supple, thyroid normal.  Respiratory: Respiratory effort normal, BS equal bilaterally without rales, rhonchi, wheezing or stridor.  Cardio: RRR with no MRGs, systolic murmur. Brisk peripheral pulses with trace bilateral edema.  Abdomen: Soft, obese/mildly distended, + BS.  Non tender, no guarding, rebound, hernias, masses. Lymphatics: Non tender without lymphadenopathy.   Musculoskeletal: Full ROM, 5/5 strength, normal gait.  Skin: Warm, dry without rashes, lesions; he has fragile skin and numerous small ecchymoses to bilateral upper extremities.  Neuro: Cranial nerves intact. Normal muscle tone, no cerebellar symptoms. Sensation intact.  Psych: Awake and oriented X 3, normal affect, Insight and Judgment appropriate.    Vicie Mutters, PA-C 3:39 PM [facility]

## 2019-07-04 ENCOUNTER — Ambulatory Visit: Payer: Medicare Other | Admitting: Adult Health Nurse Practitioner

## 2019-07-04 ENCOUNTER — Ambulatory Visit (INDEPENDENT_AMBULATORY_CARE_PROVIDER_SITE_OTHER): Payer: Medicare Other | Admitting: Physician Assistant

## 2019-07-04 ENCOUNTER — Encounter (HOSPITAL_COMMUNITY): Payer: Self-pay | Admitting: *Deleted

## 2019-07-04 ENCOUNTER — Ambulatory Visit (HOSPITAL_COMMUNITY): Payer: Self-pay | Admitting: *Deleted

## 2019-07-04 ENCOUNTER — Encounter: Payer: Self-pay | Admitting: Physician Assistant

## 2019-07-04 ENCOUNTER — Other Ambulatory Visit: Payer: Self-pay

## 2019-07-04 VITALS — BP 120/88 | HR 86 | Temp 97.5°F | Wt 186.0 lb

## 2019-07-04 DIAGNOSIS — D692 Other nonthrombocytopenic purpura: Secondary | ICD-10-CM

## 2019-07-04 DIAGNOSIS — E1122 Type 2 diabetes mellitus with diabetic chronic kidney disease: Secondary | ICD-10-CM | POA: Diagnosis not present

## 2019-07-04 DIAGNOSIS — I5032 Chronic diastolic (congestive) heart failure: Secondary | ICD-10-CM

## 2019-07-04 DIAGNOSIS — I4892 Unspecified atrial flutter: Secondary | ICD-10-CM

## 2019-07-04 DIAGNOSIS — D509 Iron deficiency anemia, unspecified: Secondary | ICD-10-CM

## 2019-07-04 DIAGNOSIS — N183 Chronic kidney disease, stage 3 unspecified: Secondary | ICD-10-CM

## 2019-07-04 DIAGNOSIS — Z7901 Long term (current) use of anticoagulants: Secondary | ICD-10-CM

## 2019-07-04 DIAGNOSIS — I480 Paroxysmal atrial fibrillation: Secondary | ICD-10-CM

## 2019-07-04 NOTE — Patient Instructions (Signed)
With your anemia and bleeding in your eye, our goal for your INR is between 2.0 and 2.5- we want to be a bit more conservative Please change your INR to  changed to 1.5 tabs 3 days a week, alternating with 1 tab 4 days a week  Follow up 1 week And then 4 weeks.

## 2019-07-04 NOTE — Progress Notes (Signed)
Chart reviewed, appropriate to schedule for pulmonary rehab, gave chart to support staff.

## 2019-07-05 ENCOUNTER — Other Ambulatory Visit: Payer: Self-pay

## 2019-07-05 ENCOUNTER — Other Ambulatory Visit (HOSPITAL_COMMUNITY): Payer: Self-pay | Admitting: *Deleted

## 2019-07-05 ENCOUNTER — Telehealth: Payer: Self-pay | Admitting: Hematology

## 2019-07-05 ENCOUNTER — Inpatient Hospital Stay: Payer: Medicare Other

## 2019-07-05 ENCOUNTER — Inpatient Hospital Stay: Payer: Medicare Other | Attending: Hematology | Admitting: Hematology

## 2019-07-05 VITALS — BP 110/68 | HR 66 | Temp 97.9°F | Resp 18 | Ht 71.0 in | Wt 185.8 lb

## 2019-07-05 DIAGNOSIS — D649 Anemia, unspecified: Secondary | ICD-10-CM

## 2019-07-05 DIAGNOSIS — D509 Iron deficiency anemia, unspecified: Secondary | ICD-10-CM

## 2019-07-05 DIAGNOSIS — D631 Anemia in chronic kidney disease: Secondary | ICD-10-CM

## 2019-07-05 DIAGNOSIS — I5032 Chronic diastolic (congestive) heart failure: Secondary | ICD-10-CM

## 2019-07-05 DIAGNOSIS — N183 Chronic kidney disease, stage 3 unspecified: Secondary | ICD-10-CM

## 2019-07-05 LAB — CBC WITH DIFFERENTIAL/PLATELET
Abs Immature Granulocytes: 0.04 10*3/uL (ref 0.00–0.07)
Basophils Absolute: 0 10*3/uL (ref 0.0–0.1)
Basophils Relative: 0 %
Eosinophils Absolute: 0.1 10*3/uL (ref 0.0–0.5)
Eosinophils Relative: 1 %
HCT: 31.8 % — ABNORMAL LOW (ref 39.0–52.0)
Hemoglobin: 9.3 g/dL — ABNORMAL LOW (ref 13.0–17.0)
Immature Granulocytes: 1 %
Lymphocytes Relative: 8 %
Lymphs Abs: 0.7 10*3/uL (ref 0.7–4.0)
MCH: 25.8 pg — ABNORMAL LOW (ref 26.0–34.0)
MCHC: 29.2 g/dL — ABNORMAL LOW (ref 30.0–36.0)
MCV: 88.3 fL (ref 80.0–100.0)
Monocytes Absolute: 0.7 10*3/uL (ref 0.1–1.0)
Monocytes Relative: 9 %
Neutro Abs: 6.8 10*3/uL (ref 1.7–7.7)
Neutrophils Relative %: 81 %
Platelets: 215 10*3/uL (ref 150–400)
RBC: 3.6 MIL/uL — ABNORMAL LOW (ref 4.22–5.81)
RDW: 18.2 % — ABNORMAL HIGH (ref 11.5–15.5)
WBC: 8.4 10*3/uL (ref 4.0–10.5)
nRBC: 0 % (ref 0.0–0.2)

## 2019-07-05 LAB — FERRITIN: Ferritin: 89 ng/mL (ref 24–336)

## 2019-07-05 LAB — IRON AND TIBC
Iron: 28 ug/dL — ABNORMAL LOW (ref 42–163)
Saturation Ratios: 6 % — ABNORMAL LOW (ref 20–55)
TIBC: 435 ug/dL — ABNORMAL HIGH (ref 202–409)
UIBC: 407 ug/dL — ABNORMAL HIGH (ref 117–376)

## 2019-07-05 LAB — VITAMIN B12: Vitamin B-12: 552 pg/mL (ref 180–914)

## 2019-07-05 LAB — SEDIMENTATION RATE: Sed Rate: 18 mm/hr — ABNORMAL HIGH (ref 0–16)

## 2019-07-05 MED ORDER — SODIUM CHLORIDE 0.9% FLUSH: 3.0000 mL | Freq: Two times a day (BID) | INTRAVENOUS | Status: DC

## 2019-07-05 NOTE — Telephone Encounter (Signed)
Scheduled per 6/30 los. Unable to reach pt. Left voicemail

## 2019-07-05 NOTE — Patient Instructions (Signed)
Thank you for choosing [facility] to provide your oncology and hematology care.   Should you have questions after your visit to the [facility] ([facility]), please contact this office at [phone] between 8:30 AM and 4:30 PM.  Voice mails left after 4:00 PM may not be returned until the following business day.  Calls received after 4:30 PM will be answered by an off-site Nurse Triage Line.    Prescription Refills:  Please have your pharmacy contact us directly for most prescription requests.  Contact the office directly for refills of narcotics (pain medications). Allow 48-72 hours for refills.  Appointments: Please contact the [facility] scheduling department [phone] for questions regarding [facility] appointment scheduling.  Contact the schedulers with any scheduling changes so that your appointment can be rescheduled in a timely manner.   Central Scheduling for [place] [phone] - Call to schedule procedures such as PET scans, CT scans, MRI, Ultrasound, etc.  To afford each patient quality time with our providers, please arrive 30 minutes before your scheduled appointment time.  If you arrive late for your appointment, you may be asked to reschedule.  We strive to give you quality time with our providers, and arriving late affects you and other patients whose appointments are after yours. If you are a no show for multiple scheduled visits, you may be dismissed from the clinic at the providers discretion.     Resources: [facility] Social Workers [phone] for additional information on assistance programs or assistance connecting with community support programs   [place] DSS  [phone]: Information regarding food stamps, Medicaid, and utility assistance [place] [phone]   [place] [employer]'s shared-ride transportation service for eligible riders who have a disability that prevents them from riding the fixed route bus.   Medicare Rights Center  800-333-4114 Helps people with Medicare understand their rights and benefits, navigate the Medicare system, and secure the quality healthcare they deserve [employer] 800-227-2345 Assists patients locate various types of support and financial assistance Cancer Care: 1-800-813-HOPE (4673) Provides financial assistance, online support groups, medication/co-pay assistance.   Transportation Assistance for appointments at [facility]: Transportation Coordinator [phone]  Again, thank you for choosing [facility] for your care.       

## 2019-07-05 NOTE — Progress Notes (Signed)
HEMATOLOGY/ONCOLOGY CONSULTATION NOTE  Date of Service: 07/05/2019  Patient Care Team: [name], MD as PCP - General (Internal Medicine) Inda Castle, MD (Inactive) as Consulting Physician (Gastroenterology) Martinique, Peter M, MD as Consulting Physician (Cardiology) Rexene Alberts, MD as Consulting Physician (Cardiothoracic Surgery) Szott, Prescott Gum, DDS as Referring Physician Hayden Pedro, MD as Consulting Physician (Ophthalmology) Lavonna Monarch, MD as Consulting Physician (Dermatology) Josue Hector, MD as Consulting Physician (Cardiology) Thompson Grayer, MD as Consulting Physician (Cardiology) Rosita Fire, MD as Consulting Physician (Nephrology)  CHIEF COMPLAINTS/PURPOSE OF CONSULTATION:  Chronic anemia  HISTORY OF PRESENTING ILLNESS:   [name]. is a wonderful 79 y.o. male who has been referred to [place] by Liane Comber, NP for evaluation and management of chronic anemia. The pt reports that he is doing well overall.   The pt reports that he has known that he was anemic for awhile. He has been using 325 mg Ferrous Sulfate which was causing abdominal discomfort and constipation when taken daily and only showed minimal anemia improvement. Pt is now taking Ferrous Sulfate three times per week. He has never required IV Iron.   Pt has been on Coumadin for his Afib and on Aspirin for years and has noticed increased bruising. A few weeks ago the vision in his right eye began to blur and he was thought to have a vitreous hemorrhage. It has since resolved. They are currently lowering his Coumadin dosage, as it was a risk factor for the hemorrhage.   Pt is following with [name] for Cardiology and has a right-sided heart catheterization scheduled for tomorrow. Scheduling of the right heart cath began after pt began to experience increased shortness of breath. He has a history of pulmonary hypertension.   He denies any recent gout flares.    Of note prior to the patient's visit today, pt has had Upper GI Endoscopy completed on  01/26/2017 with results revealing  "- Normal esophagus. 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 Injected. Treated with argon plasma coagulation (APC). Clip (MR conditional) was placed. - Normal examined duodenum. - No specimens collected."  Pt has had Colonoscopy completed on 01/26/2017 with results revealing  "- Decreased sphincter tone found on digital rectal exam. - Diverticulosis in the left colon. - One 15 mm polyp at the appendiceal orifice. Biopsied. - Internal hemorrhoids. - The examination was otherwise normal on direct and retroflexion views."  Most recent lab results (07/03/2019) of CBC & CMP is as follows: all values are WNL except for RBC at 3.74, Hgb at 9.5, HCT at 33.3, MCH at 25.4, MCHC at 28.5, RDW at 18.5, Potassium at 3.1, Chloride at 94, Glucose at 225, BUN at 35, Creatinine at 2.05, GFR Est Non Af Am at 30.  On review of systems, pt reports SOB, bruising, muscle cramps, dizziness and denies unexpected weight loss, bone pain and any other symptoms.   On PMHx the pt reports Atypical atrial flutter, BCC, CAD, Chronic diastolic congestive heart failure, CKD, DM Type II, Gout. HLD, HTN, Appendectomy, Cardiac Catheterization, CABG. 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    MEDICAL HISTORY:  Past Medical History:  Diagnosis Date  . Adrenal adenoma   . Anemia   . Anxiety   . Arthritis   . Atypical atrial flutter ([place]) 8/15, 10/15   a. DCCV 08/2013. b. s/p RFA 10/2013.  [name] Basal cell carcinoma   . CAD (coronary artery disease)  a. 04/2013 CABG x 2: LIMA to LAD, SVG to RI, EVH via R thigh.  . Cellulitis 12/2015   left leg  . Chronic diastolic congestive heart failure ([place])   . CKD (chronic kidney disease), stage III   . Depression   . Diabetes mellitus type II   . Diverticulosis 2001  . DJD (degenerative joint disease)   . GERD (gastroesophageal  reflux disease)   . Gout   . H/O hiatal hernia   . History of cardioversion    x3 (years uncertain)  . Hx of adenomatous colonic polyps   . Hyperlipidemia   . Hypertension   . Hypertensive cardiomyopathy ([place])   . Hypertensive retinopathy    OU  . Macular degeneration    OU  . Obstructive sleep apnea    compliant with CPAP  . 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 Persistent atrial fibrillation ([place])    a. s/p MAZE 04/2013 in setting of CABG. b. Amio stopped in 10/2013 after flutter ablation.  [name] PFO (patent foramen ovale)    a. Small PFO by TEE 10/2013.  [name] Pleural effusion, left    a. s/p thoracentesis 05/2013.  [name] Respiratory failure ([place])    a. Hypoxia 10/2013 - required supp O2 as inpatient, did not require it at discharge.  . S/P Maze operation for atrial fibrillation    a. 04/2013: Complete bilateral atrial lesion set using cryothermy and bipolar radiofrequency ablation with clipping of LA appendage (@ time of CABG)  . S/P Maze operation for atrial fibrillation 04/05/2013   Complete bilateral atrial lesion set using cryothermy and bipolar radiofrequency ablation with clipping of LA appendage via median sternotomy approach     SURGICAL HISTORY: Past Surgical History:  Procedure Laterality Date  . APPENDECTOMY  03/05/2017   laproscopic  . ATRIAL FIBRILLATION ABLATION N/A 10/26/2013   Procedure: ATRIAL FIBRILLATION ABLATION;  Surgeon: Coralyn Mark, MD;  Location: [place] CATH LAB;  Service: Cardiovascular;  Laterality: N/A;  . BASAL CELL CARCINOMA EXCISION     x3 on face  . CARDIAC CATHETERIZATION     myocardial bridge but no cad  . CARDIOVERSION N/A 08/23/2013   Procedure: CARDIOVERSION;  Surgeon: Sanda Klein, MD;  Location: [place];  Service: Cardiovascular;  Laterality: N/A;  . CARPOMETACARPEL SUSPENSION PLASTY Left 02/14/2014   Procedure: CARPOMETACARPEL ([place])  SUSPENSIONPLASTY THUMB  WITH  ABDUCTOR POLLICIS LONGUS TRANSFER AND STENOSING TENOSYNOVITIS RELEASE LEFT WRIST;  Surgeon: Charlotte Crumb, MD;  Location: [place];  Service: Orthopedics;  Laterality: Left;  . CATARACT EXTRACTION Bilateral   . COLONOSCOPY WITH PROPOFOL N/A 01/26/2017   Procedure: COLONOSCOPY WITH PROPOFOL;  Surgeon: Doran Stabler, MD;  Location: [facility] ENDOSCOPY;  Service: Gastroenterology;  Laterality: N/A;  . CORONARY ARTERY BYPASS GRAFT N/A 04/05/2013   Procedure: CORONARY ARTERY BYPASS GRAFTING (CABG) TIMES TWO USING LEFT INTERNAL MAMMARY ARTERY AND RIGHT SAPHENOUS LEG VEIN HARVESTED ENDOSCOPICALLY;  Surgeon: Rexene Alberts, MD;  Location: [place];  Service: Open Heart Surgery;  Laterality: N/A;  . ESOPHAGOGASTRODUODENOSCOPY (EGD) WITH PROPOFOL N/A 01/26/2017   Procedure: ESOPHAGOGASTRODUODENOSCOPY (EGD) WITH PROPOFOL;  Surgeon: Doran Stabler, MD;  Location: [facility];  Service: Gastroenterology;  Laterality: N/A;  . EYE SURGERY Bilateral    Cat Sx  . EYE SURGERY Right    RD repair - SB  . GREAT TOE ARTHRODESIS, INTERPHALANGEAL JOINT     Right foot  .  INTRAOPERATIVE TRANSESOPHAGEAL ECHOCARDIOGRAM N/A 04/05/2013   Procedure: INTRAOPERATIVE TRANSESOPHAGEAL ECHOCARDIOGRAM;  Surgeon: Rexene Alberts, MD;  Location: [place];  Service: Open Heart Surgery;  Laterality: N/A;  . LAPAROSCOPIC APPENDECTOMY N/A 03/05/2017   Procedure: APPENDECTOMY LAPAROSCOPIC;  Surgeon: Judeth Horn, MD;  Location: [place];  Service: General;  Laterality: N/A;  . LEFT HEART CATHETERIZATION WITH CORONARY ANGIOGRAM N/A 03/07/2013   Procedure: LEFT HEART CATHETERIZATION WITH CORONARY ANGIOGRAM;  Surgeon: Burnell Blanks, MD;  Location: [facility] CATH LAB;  Service: Cardiovascular;  Laterality: N/A;  . MAZE N/A 04/05/2013   Procedure: MAZE;  Surgeon: Rexene Alberts, MD;  Location: [place];  Service: Open Heart Surgery;  Laterality: N/A;  . Polinydal cyst     Removed  . POLYPECTOMY    . Retina  repair-right    . RETINAL DETACHMENT SURGERY Right    RD Repair - SB  . RIGHT HEART CATHETERIZATION N/A 05/03/2014   Procedure: RIGHT HEART CATH;  Surgeon: Jolaine Artist, MD;  Location: [facility] CATH LAB;  Service: Cardiovascular;  Laterality: N/A;  . TEE WITHOUT CARDIOVERSION N/A 08/23/2013   Procedure: TRANSESOPHAGEAL ECHOCARDIOGRAM (TEE);  Surgeon: Sanda Klein, MD;  Location: [facility] ENDOSCOPY;  Service: Cardiovascular;  Laterality: N/A;  . TEE WITHOUT CARDIOVERSION N/A 10/26/2013   Procedure: TRANSESOPHAGEAL ECHOCARDIOGRAM (TEE);  Surgeon: Sueanne Margarita, MD;  Location: [facility] ENDOSCOPY;  Service: Cardiovascular;  Laterality: N/A;  . TEE WITHOUT CARDIOVERSION N/A 06/05/2014   Procedure: TRANSESOPHAGEAL ECHOCARDIOGRAM (TEE);  Surgeon: Thayer Headings, MD;  Location: [place];  Service: Cardiovascular;  Laterality: N/A;  . TRAPEZIUM RESECTION      SOCIAL HISTORY: Social History   Socioeconomic History  . Marital status: Married    Spouse name: Not on file  . Number of children: 1  . Years of education: Not on file  . Highest education level: Not on file  Occupational History  . Occupation: retired Software engineer  Tobacco Use  . Smoking status: Former Smoker    Packs/day: 4.00    Years: 25.00    Pack years: 100.00    Types: Cigarettes    Quit date: 01/05/1981    Years since quitting: 38.5  . Smokeless tobacco: Never Used  Vaping Use  . Vaping Use: Never assessed  Substance and Sexual Activity  . Alcohol use: Yes    Alcohol/week: 1.0 standard drink    Types: 1 Shots of liquor per week    Comment: 1-5 drinks per week  . Drug use: No  . Sexual activity: Not on file  Other Topics Concern  . Not on file  Social History Narrative   Daily caffeine-yes   Patient gets regular exercise.   Pt lives in [place] with spouse.  Retired Software engineer.  Social Determinants of Health   Financial Resource Strain:   . Difficulty of Paying Living Expenses:   Food Insecurity:   . Worried About Charity fundraiser in the Last Year:   . Arboriculturist in the Last Year:   Transportation Needs:   . Film/video editor (Medical):   [name] Lack of Transportation (Non-Medical):   Physical Activity:   . Days of Exercise per Week:   . Minutes of Exercise per Session:   Stress:   . Feeling of Stress :   Social Connections:   . Frequency of Communication with Friends and Family:   . Frequency of Social Gatherings with Friends and Family:   . Attends Religious Services:   . Active Member of Clubs or Organizations:   . Attends Archivist Meetings:   [name] Marital Status:   Intimate Partner Violence:   . Fear of Current or Ex-Partner:   . Emotionally Abused:   [name] Physically Abused:   . Sexually Abused:     FAMILY HISTORY: Family History  Problem Relation Age of Onset  . Dementia Father   . Colon cancer Mother         Family History/Uncle   . Colon polyps Mother        Family History  . Atrial fibrillation Mother   . Hypertension Mother   . Colon polyps Sister        Family history  . Diabetes Maternal Uncle   . Stroke Paternal Uncle     ALLERGIES:  is allergic to sunflower seed [sunflower oil], horse-derived products, tetanus toxoids, and tetanus toxoid.  MEDICATIONS:  Current Outpatient Medications  Medication Sig Dispense Refill  . acetaminophen (TYLENOL) 500 MG tablet Take 1,000 mg by mouth every 6 (six) hours as needed for moderate pain or headache.     . allopurinol (ZYLOPRIM) 300 MG tablet Take 150 mg by mouth daily.    [name] ALPRAZolam (XANAX) 1 MG tablet Take 0.5 tablets (0.5 mg total) by mouth at bedtime as needed for sleep. 60 tablet 0  . Alum Hydroxide-Mag Carbonate (GAVISCON PO) Take 4 tablets by mouth daily as needed (acid reflux).     [name] aspirin EC 81 MG tablet Take 81 mg by mouth daily.    [name] atorvastatin (LIPITOR) 20 MG tablet Take 1 tablet 3 x /week (Patient taking differently: Take 20 mg by mouth 3 (three) times a week. Take 1 tablet 3 x /week) 36 tablet 3  . azelastine (ASTELIN) 0.1 % nasal spray Place 2 sprays into both nostrils 2 (two) times daily as needed for rhinitis or allergies.    . B Complex Vitamins (VITAMIN B COMPLEX PO) Take 1 tablet by mouth at bedtime.     . bacitracin 500 UNIT/GM ointment Apply 1 application topically daily as needed for wound care.    . Blood Glucose Monitoring Suppl (ACCU-CHEK AVIVA PLUS) w/Device KIT Check blood sugar 1 time  daily 1 kit 0  . Blood Glucose Monitoring Suppl DEVI Test blood sugar up to three times a day or as directed. 1 each 0  . carvedilol (COREG) 6.25 MG tablet Take 0.5 tablets (3.125 mg total) by mouth in the morning AND 1 tablet (6.25 mg total) every evening. 270 tablet 1  . Cholecalciferol (VITAMIN D PO) Take 5,000 Units by mouth daily.     . Continuous Blood Gluc Receiver (FREESTYLE LIBRE 14 DAY READER) DEVI 1 Device by Does not  apply route 3 (three) times daily. Dx:  E11.22, N18.4, Z79.4 1 each 0  . Continuous Blood Gluc Sensor (FREESTYLE LIBRE 14 DAY SENSOR) MISC Check blood sugar 3 times a day. Dx: E11.22, N18.4, Z79.4 2 each 12  . diphenhydrAMINE (BENADRYL) 25 MG tablet Take 25 mg by mouth at bedtime as needed for allergies.     . ferrous sulfate 325 (65 FE) MG EC tablet Take 325 mg by mouth 3 (three) times a week. Take with vitamin C     . fluticasone (FLONASE) 50 MCG/ACT nasal spray Place 1 spray into both nostrils daily as needed for allergies. 16 g 3  . glucose blood (ACCU-CHEK AVIVA PLUS) test strip CHECK BLOOD GLUCOSE 3 TIMES DAILY. 300 strip 3  . insulin NPH-regular Human (70-30) 100 UNIT/ML injection Inject 25-45 Units into the skin See admin instructions. 45 units in the morning and 25 units in the evening     . lidocaine (LIDODERM) 5 % Place 1 patch onto the skin daily as needed (for pain). Remove & Discard patch within 12 hours or as directed by MD     . loratadine (CLARITIN) 10 MG tablet Take 10 mg by mouth daily as needed for allergies.     . metolazone (ZAROXOLYN) 2.5 MG tablet TAKE 1 TABLET BY MOUTH  DAILY AS NEEDED FOR  SWELLING (Patient taking differently: Take 2.5 mg by mouth daily as needed (swelling). ) 60 tablet 0  . Multiple Vitamins-Minerals (PRESERVISION AREDS 2) CAPS Take 1 capsule by mouth 2 (two) times daily.     . polyvinyl alcohol (ARTIFICIAL TEARS) 1.4 % ophthalmic solution Place 1 drop into both eyes daily as needed for dry eyes.    . potassium chloride SA (KLOR-CON) 20 MEQ tablet Take 3 tablets (60 mEq total) by mouth in the morning AND 2 tablets (40 mEq total) daily after lunch AND 2 tablets (40 mEq total) every evening. 540 tablet 3  . sertraline (ZOLOFT) 100 MG tablet Take 1 tablet Daily for Mood (Patient taking differently: Take 100 mg by mouth daily. Take 1 tablet Daily for Mood) 90 tablet 3  . torsemide (DEMADEX) 100 MG tablet Take 150 mg by mouth daily.    [name] warfarin (COUMADIN) 5 MG  tablet Take 5-7.5 mg by mouth See admin instructions. 1.5 tablets 4 days a week. Alternating with 1 tablets on MWF (total 9 tabs/week)      No current facility-administered medications for this visit.    REVIEW OF SYSTEMS:    10 Point review of Systems was done is negative except as noted above.  PHYSICAL EXAMINATION: ECOG PERFORMANCE STATUS: 1 - Symptomatic but completely ambulatory  . Vitals:   07/05/19 1321  BP: 110/68  Pulse: 66  Resp: 18  Temp: 97.9 F (36.6 C)  SpO2: 97%   Filed Weights   07/05/19 1321  Weight: 185 lb 12.8 oz (84.3 kg)   .Body mass index is 25.91 kg/m.  GENERAL:alert, in no acute distress and comfortable SKIN: no acute rashes, no significant lesions EYES: conjunctiva are pink and non-injected, sclera anicteric OROPHARYNX: MMM, no exudates, no oropharyngeal erythema or ulceration NECK: supple, no JVD LYMPH:  no palpable lymphadenopathy in the cervical, axillary or inguinal regions LUNGS: clear to auscultation b/l with normal respiratory effort HEART: regular rate & rhythm ABDOMEN:  normoactive bowel sounds , non tender, not distended. Extremity: no pedal edema PSYCH: alert & oriented x 3 with fluent speech NEURO: no focal motor/sensory deficits  LABORATORY DATA:  I have reviewed the data as listed  . CBC Latest  Ref Rng & Units 07/06/2019 07/06/2019 07/06/2019  WBC 4.0 - 10.5 K/uL - - -  Hemoglobin 13.0 - 17.0 g/dL 8.8(L) 9.5(L) 9.2(L)  Hematocrit 39 - 52 % 26.0(L) 28.0(L) 27.0(L)  Platelets 150 - 400 K/uL - - -   . CBC    Component Value Date/Time   WBC 8.4 07/05/2019 1415   RBC 3.60 (L) 07/05/2019 1415   HGB 8.8 (L) 07/06/2019 1031   HCT 26.0 (L) 07/06/2019 1031   HCT 30.3 (L) 07/05/2019 1416   PLT 215 07/05/2019 1415   MCV 88.3 07/05/2019 1415   MCH 25.8 (L) 07/05/2019 1415   MCHC 29.2 (L) 07/05/2019 1415   RDW 18.2 (H) 07/05/2019 1415   LYMPHSABS 0.7 07/05/2019 1415   MONOABS 0.7 07/05/2019 1415   EOSABS 0.1 07/05/2019 1415    BASOSABS 0.0 07/05/2019 1415     . CMP Latest Ref Rng & Units 07/03/2019 06/15/2019 06/12/2019  Glucose 70 - 99 mg/dL 225(H) 243(H) 142(H)  BUN 8 - 23 mg/dL 35(H) 49(H) 32(H)  Creatinine 0.61 - 1.24 mg/dL 2.05(H) 2.49(H) 1.95(H)  Sodium 135 - 145 mmol/L 138 135 136  Potassium 3.5 - 5.1 mmol/L 3.1(L) 3.6 3.7  Chloride 98 - 111 mmol/L 94(L) 93(L) 97(L)  CO2 22 - 32 mmol/L 32 30 25  Calcium 8.9 - 10.3 mg/dL 9.1 8.8 8.8(L)  Total Protein 6.5 - 8.1 g/dL 6.9 6.6 -  Total Bilirubin 0.3 - 1.2 mg/dL 0.6 0.6 -  Alkaline Phos 38 - 126 U/L 112 - -  AST 15 - 41 U/L 21 18 -  ALT 0 - 44 U/L 23 19 -     RADIOGRAPHIC STUDIES: I have personally reviewed the radiological images as listed and agreed with the findings in the report.   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 -Advised pt that there will be some element of anemia due to CKD. Because of this, goal Ferritin ? 200-250 -Advised pt that if he is still severely anemic despite well-replaced Ferritin he may have low Erythropoietin levels, for which we would use ESAs. Goal Hgb would be 10-12 if that is the case.  -Advised pt that Allopurinol, Aspirin, & Coumadin can contribute to low-level blood loss -Pt has a hard time tolerating PO Iron. Advised pt that it would take several months to replace Iron orally, provided he is not losing a significant amount of blood.  -Recommend pt use compression socks to improve dizziness when rapidly changing positions  -Advised pt that improving Hgb may assist with some of his SOB -Will give IV Injectafer weekly x2  -Will get labs today  -Will see  back in 10 weeks with labs    FOLLOW UP: Labs today IV Injectafer weekly x 2 doses (plz schedule starting in 1 week) RTC with Dr Irene Limbo in 10 weeks    All of the patients questions were answered with apparent satisfaction. The patient knows to call the clinic with any problems, questions or concerns.  I spent 30 min5 counseling the patient face to face. The total time spent in the appointment was  45 minutes and more than 50% was on counseling and direct patient cares.    Sullivan Lone MD [place] AAHIVMS [facility] Hematology/Oncology Physician [facility]  (Office):       [phone] (Work cell):  [phone] (Fax):           [phone]  07/05/2019 4:15 PM  I, Yevette Edwards, am acting as a scribe for Dr. Sullivan Lone.   .I have reviewed the above documentation for accuracy and completeness, and I agree with the above. Brunetta Genera MD

## 2019-07-06 ENCOUNTER — Ambulatory Visit (HOSPITAL_COMMUNITY)
Admission: RE | Admit: 2019-07-06 | Discharge: 2019-07-06 | Disposition: A | Discharge status: Home / Self Care | Payer: Medicare Other | Attending: Internal Medicine | Admitting: Internal Medicine

## 2019-07-06 ENCOUNTER — Encounter (HOSPITAL_COMMUNITY)
Admission: RE | Disposition: A | Discharge status: Home / Self Care | Payer: Self-pay | Source: Home / Self Care | Attending: Internal Medicine

## 2019-07-06 DIAGNOSIS — M109 Gout, unspecified: Secondary | ICD-10-CM | POA: Insufficient documentation

## 2019-07-06 DIAGNOSIS — Q211 Atrial septal defect: Secondary | ICD-10-CM | POA: Insufficient documentation

## 2019-07-06 DIAGNOSIS — G4733 Obstructive sleep apnea (adult) (pediatric): Secondary | ICD-10-CM | POA: Insufficient documentation

## 2019-07-06 DIAGNOSIS — Z951 Presence of aortocoronary bypass graft: Secondary | ICD-10-CM | POA: Insufficient documentation

## 2019-07-06 DIAGNOSIS — Z8249 Family history of ischemic heart disease and other diseases of the circulatory system: Secondary | ICD-10-CM | POA: Insufficient documentation

## 2019-07-06 DIAGNOSIS — Z87891 Personal history of nicotine dependence: Secondary | ICD-10-CM | POA: Insufficient documentation

## 2019-07-06 DIAGNOSIS — Z794 Long term (current) use of insulin: Secondary | ICD-10-CM | POA: Insufficient documentation

## 2019-07-06 DIAGNOSIS — M1990 Unspecified osteoarthritis, unspecified site: Secondary | ICD-10-CM | POA: Insufficient documentation

## 2019-07-06 DIAGNOSIS — K219 Gastro-esophageal reflux disease without esophagitis: Secondary | ICD-10-CM | POA: Insufficient documentation

## 2019-07-06 DIAGNOSIS — Z887 Allergy status to serum and vaccine status: Secondary | ICD-10-CM | POA: Insufficient documentation

## 2019-07-06 DIAGNOSIS — Z7901 Long term (current) use of anticoagulants: Secondary | ICD-10-CM | POA: Diagnosis not present

## 2019-07-06 DIAGNOSIS — N184 Chronic kidney disease, stage 4 (severe): Secondary | ICD-10-CM | POA: Diagnosis not present

## 2019-07-06 DIAGNOSIS — D649 Anemia, unspecified: Secondary | ICD-10-CM | POA: Diagnosis not present

## 2019-07-06 DIAGNOSIS — I130 Hypertensive heart and chronic kidney disease with heart failure and stage 1 through stage 4 chronic kidney disease, or unspecified chronic kidney disease: Secondary | ICD-10-CM | POA: Diagnosis not present

## 2019-07-06 DIAGNOSIS — Z7982 Long term (current) use of aspirin: Secondary | ICD-10-CM | POA: Insufficient documentation

## 2019-07-06 DIAGNOSIS — E785 Hyperlipidemia, unspecified: Secondary | ICD-10-CM | POA: Insufficient documentation

## 2019-07-06 DIAGNOSIS — Z79899 Other long term (current) drug therapy: Secondary | ICD-10-CM | POA: Diagnosis not present

## 2019-07-06 DIAGNOSIS — F419 Anxiety disorder, unspecified: Secondary | ICD-10-CM | POA: Diagnosis not present

## 2019-07-06 DIAGNOSIS — Z833 Family history of diabetes mellitus: Secondary | ICD-10-CM | POA: Diagnosis not present

## 2019-07-06 DIAGNOSIS — I2721 Secondary pulmonary arterial hypertension: Secondary | ICD-10-CM | POA: Diagnosis not present

## 2019-07-06 DIAGNOSIS — E1122 Type 2 diabetes mellitus with diabetic chronic kidney disease: Secondary | ICD-10-CM | POA: Diagnosis not present

## 2019-07-06 DIAGNOSIS — I480 Paroxysmal atrial fibrillation: Secondary | ICD-10-CM | POA: Diagnosis not present

## 2019-07-06 DIAGNOSIS — J449 Chronic obstructive pulmonary disease, unspecified: Secondary | ICD-10-CM | POA: Insufficient documentation

## 2019-07-06 DIAGNOSIS — I5032 Chronic diastolic (congestive) heart failure: Secondary | ICD-10-CM | POA: Diagnosis not present

## 2019-07-06 HISTORY — PX: RIGHT HEART CATH: CATH118263

## 2019-07-06 LAB — POCT I-STAT EG7
Acid-Base Excess: 7 mmol/L — ABNORMAL HIGH (ref 0.0–2.0)
Acid-Base Excess: 7 mmol/L — ABNORMAL HIGH (ref 0.0–2.0)
Acid-Base Excess: 6 mmol/L — ABNORMAL HIGH (ref 0.0–2.0)
Bicarbonate: 30.4 mmol/L — ABNORMAL HIGH (ref 20.0–28.0)
Bicarbonate: 30.9 mmol/L — ABNORMAL HIGH (ref 20.0–28.0)
Bicarbonate: 30.2 mmol/L — ABNORMAL HIGH (ref 20.0–28.0)
Calcium, Ion: 1.11 mmol/L — ABNORMAL LOW (ref 1.15–1.40)
Calcium, Ion: 1.12 mmol/L — ABNORMAL LOW (ref 1.15–1.40)
Calcium, Ion: 1.03 mmol/L — ABNORMAL LOW (ref 1.15–1.40)
HCT: 27 % — ABNORMAL LOW (ref 39.0–52.0)
HCT: 28 % — ABNORMAL LOW (ref 39.0–52.0)
HCT: 26 % — ABNORMAL LOW (ref 39.0–52.0)
Hemoglobin: 9.2 g/dL — ABNORMAL LOW (ref 13.0–17.0)
Hemoglobin: 9.5 g/dL — ABNORMAL LOW (ref 13.0–17.0)
Hemoglobin: 8.8 g/dL — ABNORMAL LOW (ref 13.0–17.0)
O2 Saturation: 65 %
O2 Saturation: 67 %
O2 Saturation: 52 %
Potassium: 3.4 mmol/L — ABNORMAL LOW (ref 3.5–5.1)
Potassium: 3.5 mmol/L (ref 3.5–5.1)
Potassium: 3.3 mmol/L — ABNORMAL LOW (ref 3.5–5.1)
Sodium: 143 mmol/L (ref 135–145)
Sodium: 143 mmol/L (ref 135–145)
Sodium: 146 mmol/L — ABNORMAL HIGH (ref 135–145)
TCO2: 31 mmol/L (ref 22–32)
TCO2: 32 mmol/L (ref 22–32)
TCO2: 31 mmol/L (ref 22–32)
pCO2, Ven: 36.2 mmHg — ABNORMAL LOW (ref 44.0–60.0)
pCO2, Ven: 38.5 mmHg — ABNORMAL LOW (ref 44.0–60.0)
pCO2, Ven: 41.8 mmHg — ABNORMAL LOW (ref 44.0–60.0)
pH, Ven: 7.513 — ABNORMAL HIGH (ref 7.250–7.430)
pH, Ven: 7.531 — ABNORMAL HIGH (ref 7.250–7.430)
pH, Ven: 7.466 — ABNORMAL HIGH (ref 7.250–7.430)
pO2, Ven: 30 mmHg — CL (ref 32.0–45.0)
pO2, Ven: 31 mmHg — CL (ref 32.0–45.0)
pO2, Ven: 26 mmHg — CL (ref 32.0–45.0)

## 2019-07-06 LAB — PROTIME-INR
INR: 2.1 — ABNORMAL HIGH (ref 0.8–1.2)
Prothrombin Time: 22.7 seconds — ABNORMAL HIGH (ref 11.4–15.2)

## 2019-07-06 LAB — BASIC METABOLIC PANEL
Anion gap: 10 (ref 5–15)
BUN: 37 mg/dL — ABNORMAL HIGH (ref 8–23)
CO2: 30 mmol/L (ref 22–32)
Calcium: 8.6 mg/dL — ABNORMAL LOW (ref 8.9–10.3)
Chloride: 99 mmol/L (ref 98–111)
Creatinine, Ser: 1.94 mg/dL — ABNORMAL HIGH (ref 0.61–1.24)
GFR calc Af Amer: 37 mL/min — ABNORMAL LOW (ref >60)
GFR calc non Af Amer: 32 mL/min — ABNORMAL LOW (ref >60)
Glucose, Bld: 126 mg/dL — ABNORMAL HIGH (ref 70–99)
Potassium: 3.4 mmol/L — ABNORMAL LOW (ref 3.5–5.1)
Sodium: 139 mmol/L (ref 135–145)

## 2019-07-06 LAB — KAPPA/LAMBDA LIGHT CHAINS
Kappa free light chain: 50.9 mg/L — ABNORMAL HIGH (ref 3.3–19.4)
Kappa, lambda light chain ratio: 1.17 (ref 0.26–1.65)
Lambda free light chains: 43.4 mg/L — ABNORMAL HIGH (ref 5.7–26.3)

## 2019-07-06 LAB — GLUCOSE, CAPILLARY: Glucose-Capillary: 101 mg/dL — ABNORMAL HIGH (ref 70–99)

## 2019-07-06 LAB — FOLATE RBC
Folate, Hemolysate: >620 ng/mL
Folate, RBC: >2046 ng/mL (ref >498)
Hematocrit: 30.3 % — ABNORMAL LOW (ref 37.5–51.0)

## 2019-07-06 LAB — ERYTHROPOIETIN: Erythropoietin: 67.1 m[IU]/mL — ABNORMAL HIGH (ref 2.6–18.5)

## 2019-07-06 SURGERY — RIGHT HEART CATH
Anesthesia: LOCAL

## 2019-07-06 MED ORDER — SODIUM CHLORIDE 0.9 % IV SOLN: INTRAVENOUS | Status: DC

## 2019-07-06 MED ORDER — SODIUM CHLORIDE 0.9% FLUSH: 3.0000 mL | INTRAVENOUS | Status: DC | PRN

## 2019-07-06 MED ORDER — SODIUM CHLORIDE 0.9 % IV SOLN: 250.0000 mL | INTRAVENOUS | Status: DC | PRN

## 2019-07-06 MED ORDER — LIDOCAINE HCL (PF) 1 % IJ SOLN
INTRAMUSCULAR | Status: DC | PRN
  Administered 2019-07-06: 2 mL

## 2019-07-06 MED ORDER — ACETAMINOPHEN 325 MG PO TABS: 650.0000 mg | ORAL_TABLET | ORAL | Status: DC | PRN

## 2019-07-06 MED ORDER — LABETALOL HCL 5 MG/ML IV SOLN: 10.0000 mg | INTRAVENOUS | Status: DC | PRN

## 2019-07-06 MED ORDER — HEPARIN (PORCINE) IN NACL 1000-0.9 UT/500ML-% IV SOLN
INTRAVENOUS | Status: AC
  Filled 2019-07-06: qty 1000

## 2019-07-06 MED ORDER — HYDRALAZINE HCL 20 MG/ML IJ SOLN: 10.0000 mg | INTRAMUSCULAR | Status: DC | PRN

## 2019-07-06 MED ORDER — ASPIRIN 81 MG PO CHEW: 81.0000 mg | CHEWABLE_TABLET | ORAL | Status: DC

## 2019-07-06 MED ORDER — HEPARIN (PORCINE) IN NACL 1000-0.9 UT/500ML-% IV SOLN
INTRAVENOUS | Status: DC | PRN
  Administered 2019-07-06: 500 mL

## 2019-07-06 MED ORDER — ONDANSETRON HCL 4 MG/2ML IJ SOLN: 4.0000 mg | Freq: Four times a day (QID) | INTRAMUSCULAR | Status: DC | PRN

## 2019-07-06 MED ORDER — LIDOCAINE HCL (PF) 1 % IJ SOLN
INTRAMUSCULAR | Status: AC
  Filled 2019-07-06: qty 30

## 2019-07-06 MED ORDER — SODIUM CHLORIDE 0.9% FLUSH: 3.0000 mL | Freq: Two times a day (BID) | INTRAVENOUS | Status: DC

## 2019-07-06 SURGICAL SUPPLY — 5 items
CATH BALLN WEDGE 5F 110CM (CATHETERS) ×1 IMPLANT
PACK CARDIAC CATHETERIZATION (CUSTOM PROCEDURE TRAY) ×2 IMPLANT
SHEATH GLIDE SLENDER 4/5FR (SHEATH) ×1 IMPLANT
TRANSDUCER W/STOPCOCK (MISCELLANEOUS) ×2 IMPLANT
WIRE EMERALD 3MM-J .025X260CM (WIRE) ×1 IMPLANT

## 2019-07-06 NOTE — Interval H&P Note (Signed)
History and Physical Interval Note:  07/06/2019 10:10 AM  [name].  has presented today for surgery, with the diagnosis of heart failure.  The various methods of treatment have been discussed with the patient and family. After consideration of risks, benefits and other options for treatment, the patient has consented to  Procedure(s): RIGHT HEART CATH (N/A) as a surgical intervention.  The patient's history has been reviewed, patient examined, no change in status, stable for surgery.  I have reviewed the patient's chart and labs.  Questions were answered to the patient's satisfaction.     [name]

## 2019-07-06 NOTE — Progress Notes (Signed)
Discharge instructions reviewed with patient and family. Verbalized understanding. 

## 2019-07-06 NOTE — Discharge Instructions (Signed)
Brachial Site Care  This sheet gives you information about how to care for yourself after your procedure. Your health care provider may also give you more specific instructions. If you have problems or questions, contact your health care provider. What can I expect after the procedure? After the procedure, it is common to have:  Bruising and tenderness at the catheter insertion area. Follow these instructions at home: Medicines  Take over-the-counter and prescription medicines only as told by your health care provider. Insertion site care  Follow instructions from your health care provider about how to take care of your insertion site. Make sure you: ? Wash your hands with soap and water before you change your bandage (dressing). If soap and water are not available, use hand sanitizer. ? Change your dressing as told by your health care provider. ? Leave stitches (sutures), skin glue, or adhesive strips in place. These skin closures may need to stay in place for 2 weeks or longer. If adhesive strip edges start to loosen and curl up, you may trim the loose edges. Do not remove adhesive strips completely unless your health care provider tells you to do that.  Check your insertion site every day for signs of infection. Check for: ? Redness, swelling, or pain. ? Fluid or blood. ? Pus or a bad smell. ? Warmth.  Do not take baths, swim, or use a hot tub until your health care provider approves.  You may shower 24-48 hours after the procedure, or as directed by your health care provider. ? Remove the dressing and gently wash the site with plain soap and water. ? Pat the area dry with a clean towel. ? Do not rub the site. That could cause bleeding.  Do not apply powder or lotion to the site. Activity   For 24 hours after the procedure, or as directed by your health care provider: ? Do not flex or bend the affected arm. ? Do not push or pull heavy objects with the affected arm. ? Do not  drive yourself home from the hospital or clinic. You may drive 24 hours after the procedure unless your health care provider tells you not to. ? Do not operate machinery or power tools.  Do not lift anything that is heavier than 10 lb (4.5 kg), or the limit that you are told, until your health care provider says that it is safe.  Ask your health care provider when it is okay to: ? Return to work or school. ? Resume usual physical activities or sports. ? Resume sexual activity. General instructions  If the catheter site starts to bleed, raise your arm and put firm pressure on the site. If the bleeding does not stop, get help right away. This is a medical emergency.  If you went home on the same day as your procedure, a responsible adult should be with you for the first 24 hours after you arrive home.  Keep all follow-up visits as told by your health care provider. This is important. Contact a health care provider if:  You have a fever.  You have redness, swelling, or yellow drainage around your insertion site. Get help right away if:  You have unusual pain at the radial site.  The catheter insertion area swells very fast.  The insertion area is bleeding, and the bleeding does not stop when you hold steady pressure on the area.  Your arm or hand becomes pale, cool, tingly, or numb. These symptoms may represent a serious problem   that is an emergency. Do not wait to see if the symptoms will go away. Get medical help right away. Call your local emergency services (911 in the [place] Do not drive yourself to the hospital. Summary  After the procedure, it is common to have bruising and tenderness at the site.  Follow instructions from your health care provider about how to take care of your radial site wound. Check the wound every day for signs of infection.  Do not lift anything that is heavier than 10 lb (4.5 kg), or the limit that you are told, until your health care provider says  that it is safe. This information is not intended to replace advice given to you by your health care provider. Make sure you discuss any questions you have with your health care provider. Document Revised: 01/27/2017 Document Reviewed: 01/27/2017 Elsevier Patient Education  2020 [employer]   

## 2019-07-07 ENCOUNTER — Other Ambulatory Visit: Payer: Self-pay

## 2019-07-07 ENCOUNTER — Ambulatory Visit (HOSPITAL_COMMUNITY)
Admission: RE | Admit: 2019-07-07 | Discharge: 2019-07-07 | Disposition: A | Discharge status: Home / Self Care | Payer: Medicare Other | Source: Ambulatory Visit | Attending: Cardiology | Admitting: Cardiology

## 2019-07-07 ENCOUNTER — Encounter (HOSPITAL_COMMUNITY): Payer: Self-pay | Admitting: Internal Medicine

## 2019-07-07 DIAGNOSIS — R42 Dizziness and giddiness: Secondary | ICD-10-CM | POA: Diagnosis not present

## 2019-07-07 LAB — MULTIPLE MYELOMA PANEL, SERUM
Albumin SerPl Elph-Mcnc: 3.6 g/dL (ref 2.9–4.4)
Albumin/Glob SerPl: 1.3 (ref 0.7–1.7)
Alpha 1: 0.2 g/dL (ref 0.0–0.4)
Alpha2 Glob SerPl Elph-Mcnc: 0.7 g/dL (ref 0.4–1.0)
B-Globulin SerPl Elph-Mcnc: 1.1 g/dL (ref 0.7–1.3)
Gamma Glob SerPl Elph-Mcnc: 0.9 g/dL (ref 0.4–1.8)
Globulin, Total: 2.9 g/dL (ref 2.2–3.9)
IgA: 387 mg/dL (ref 61–437)
IgG (Immunoglobin G), Serum: 894 mg/dL (ref 603–1613)
IgM (Immunoglobulin M), Srm: 56 mg/dL (ref 15–143)
Total Protein ELP: 6.5 g/dL (ref 6.0–8.5)

## 2019-07-12 ENCOUNTER — Inpatient Hospital Stay: Payer: Medicare Other | Attending: Hematology

## 2019-07-12 ENCOUNTER — Ambulatory Visit (INDEPENDENT_AMBULATORY_CARE_PROVIDER_SITE_OTHER): Payer: Medicare Other | Admitting: Physician Assistant

## 2019-07-12 ENCOUNTER — Encounter: Payer: Self-pay | Admitting: Physician Assistant

## 2019-07-12 ENCOUNTER — Other Ambulatory Visit: Payer: Self-pay

## 2019-07-12 VITALS — BP 119/60 | HR 55 | Temp 97.9°F | Resp 17

## 2019-07-12 VITALS — BP 110/60 | HR 57 | Temp 97.5°F | Wt 186.0 lb

## 2019-07-12 DIAGNOSIS — N183 Chronic kidney disease, stage 3 unspecified: Secondary | ICD-10-CM

## 2019-07-12 DIAGNOSIS — D649 Anemia, unspecified: Secondary | ICD-10-CM | POA: Diagnosis not present

## 2019-07-12 DIAGNOSIS — D631 Anemia in chronic kidney disease: Secondary | ICD-10-CM | POA: Insufficient documentation

## 2019-07-12 DIAGNOSIS — E1122 Type 2 diabetes mellitus with diabetic chronic kidney disease: Secondary | ICD-10-CM | POA: Diagnosis not present

## 2019-07-12 DIAGNOSIS — D509 Iron deficiency anemia, unspecified: Secondary | ICD-10-CM

## 2019-07-12 LAB — CBC WITH DIFFERENTIAL/PLATELET
Absolute Monocytes: 800 cells/uL (ref 200–950)
Basophils Absolute: 26 cells/uL (ref 0–200)
Basophils Relative: 0.3 %
Eosinophils Absolute: 96 cells/uL (ref 15–500)
Eosinophils Relative: 1.1 %
HCT: 31.2 % — ABNORMAL LOW (ref 38.5–50.0)
Hemoglobin: 9.4 g/dL — ABNORMAL LOW (ref 13.2–17.1)
Lymphs Abs: 974 cells/uL (ref 850–3900)
MCH: 25.5 pg — ABNORMAL LOW (ref 27.0–33.0)
MCHC: 30.1 g/dL — ABNORMAL LOW (ref 32.0–36.0)
MCV: 84.6 fL (ref 80.0–100.0)
MPV: 8.9 fL (ref 7.5–12.5)
Monocytes Relative: 9.2 %
Neutro Abs: 6803 cells/uL (ref 1500–7800)
Neutrophils Relative %: 78.2 %
Platelets: 241 10*3/uL (ref 140–400)
RBC: 3.69 10*6/uL — ABNORMAL LOW (ref 4.20–5.80)
RDW: 16.8 % — ABNORMAL HIGH (ref 11.0–15.0)
Total Lymphocyte: 11.2 %
WBC: 8.7 10*3/uL (ref 3.8–10.8)

## 2019-07-12 LAB — COMPLETE METABOLIC PANEL WITH GFR
AG Ratio: 1.6 (calc) (ref 1.0–2.5)
ALT: 17 U/L (ref 9–46)
AST: 17 U/L (ref 10–35)
Albumin: 4.1 g/dL (ref 3.6–5.1)
Alkaline phosphatase (APISO): 106 U/L (ref 35–144)
BUN/Creatinine Ratio: 15 (calc) (ref 6–22)
BUN: 31 mg/dL — ABNORMAL HIGH (ref 7–25)
CO2: 34 mmol/L — ABNORMAL HIGH (ref 20–32)
Calcium: 9.2 mg/dL (ref 8.6–10.3)
Chloride: 99 mmol/L (ref 98–110)
Creat: 2.04 mg/dL — ABNORMAL HIGH (ref 0.70–1.11)
GFR, Est African American: 35 mL/min/{1.73_m2} — ABNORMAL LOW (ref >=60)
GFR, Est Non African American: 30 mL/min/{1.73_m2} — ABNORMAL LOW (ref >=60)
Globulin: 2.5 g/dL (calc) (ref 1.9–3.7)
Glucose, Bld: 86 mg/dL (ref 65–99)
Potassium: 3.7 mmol/L (ref 3.5–5.3)
Sodium: 140 mmol/L (ref 135–146)
Total Bilirubin: 0.5 mg/dL (ref 0.2–1.2)
Total Protein: 6.6 g/dL (ref 6.1–8.1)

## 2019-07-12 MED ORDER — LORATADINE 10 MG PO TABS: 10.0000 mg | ORAL_TABLET | Freq: Once | ORAL | Status: DC

## 2019-07-12 MED ORDER — ACETAMINOPHEN 325 MG PO TABS
650.0000 mg | ORAL_TABLET | Freq: Once | ORAL | Status: AC
  Administered 2019-07-12: 650 mg via ORAL

## 2019-07-12 MED ORDER — SODIUM CHLORIDE 0.9 % IV SOLN
Freq: Once | INTRAVENOUS | Status: AC
  Filled 2019-07-12: qty 250

## 2019-07-12 MED ORDER — FAMOTIDINE 20 MG PO TABS
20.0000 mg | ORAL_TABLET | Freq: Once | ORAL | Status: AC
  Administered 2019-07-12: 20 mg via ORAL

## 2019-07-12 MED ORDER — FAMOTIDINE 20 MG PO TABS
ORAL_TABLET | ORAL | Status: AC
  Filled 2019-07-12: qty 1

## 2019-07-12 MED ORDER — SODIUM CHLORIDE 0.9 % IV SOLN
750.0000 mg | Freq: Once | INTRAVENOUS | Status: AC
  Administered 2019-07-12: 750 mg via INTRAVENOUS
  Filled 2019-07-12: qty 15

## 2019-07-12 MED ORDER — ACETAMINOPHEN 325 MG PO TABS
ORAL_TABLET | ORAL | Status: AC
  Filled 2019-07-12: qty 2

## 2019-07-12 NOTE — Patient Instructions (Signed)

## 2019-07-12 NOTE — Progress Notes (Signed)
Assessment and Plan:  Anemia Has CKD- ? Anemia of chronic disease Following with Dr. Irene Limbo- started on iron infusion- possible abnormal kappa/gamma chains- will follow up with Dr. Irene Limbo Normal EPO Had GI work up in 2019 with history of AVM- may need to follow up thought hemoccult negative, DRE negative.   Dizziness Suggest wearing compression socks correct anemia   PAF (paroxysmal atrial fibrillation) (HCC) Check INR and will adjust medication according to labs in 1 week.  Discussed if patient falls to immediately contact office or go to ER. Discussed foods that can increase or decrease Coumadin levels.  Chronic diastolic congestive heart failure (HCC) Encouraged daily monitoring of the patient's weight Continue follow up with cardiology  Senile purpura ([place]) Discussed process, protect skin, sunscreen  CKD stage 3 due to type 2 diabetes mellitus ([place]) Monitor- check with cardiology- continue to monitor kidney function   Further disposition pending results of labs. Discussed med's effects and SE's.   Over 30 minutes of exam, counseling, chart review, and critical decision making was performed.   Future Appointments  Date Time Provider [place]  07/17/2019 12:00 PM [facility] LAB [facility] None  07/19/2019 10:15 AM [facility] INFUSION [facility] None  08/03/2019  2:30 PM Vicie Mutters, PA-C [facility] None  09/01/2019 12:30 PM Hayden Pedro, MD [facility] None  09/13/2019  9:45 AM [facility] LAB 5 [facility] None  09/13/2019 10:20 AM Brunetta Genera, MD [facility] None  10/11/2019 10:00 AM [name], MD [facility] None    ------------------------------------------------------------------------------------------------------------------   HPI BP 110/60   Pulse (!) 57   Temp (!) 97.5 F (36.4 C)   Wt 186 lb (84.4 kg)   SpO2 98%   BMI 25.94 kg/m   79 y.o.male presents for follow up on coumadin for p. A. fib, CHF, htn, CKD III/IV.  Patient had recent [place]  with Dr. Sung Amabile. He endorses dysnpea has been worse. Just finished metalozone in addition to his Torsemid 172m and has lost 7-8 lbs according to the patient  He has seen Dr. KIrene Limbofor anemia, starting on iron infusion- had one today. While he was walking to the car he states he got dizzy and fell into the door frame, had normal sugar, normal BP at that time, got home and states he feel in his bedroom. He still has dizziness with he stands or starts to move. It has been too hot for him to wear compression stockings.   Cardiology is treating carvedilol 6.25 mg - takes 1/2 tab BID due to hypotension   Wt Readings from Last 3 Encounters:  07/12/19 186 lb (84.4 kg)  07/06/19 187 lb (84.8 kg)  07/05/19 185 lb 12.8 oz (84.3 kg)   Patient is on Coumadin for No primary diagnosis found. Patient's last INR is  Lab Results  Component Value Date   INR 2.1 (H) 07/06/2019   INR 2.8 (H) 07/03/2019   INR 2.8 (H) 06/29/2019    Patient denies CP,  and blood in stool/urine.  His coumadin dose was changed last visit due to acute vitreous hemorrhage of his right eye, his new goal is 2-2.5.  Current dose: changed to 1.5 tabs 4 days a week, alternating with 1 tab on MWF (total 9 tabs/week)  He has not taken ABX, has not missed any doses. Denies falls.  HE HAD HIS INR CHECKED YESTERDAY AND IT WAS STILL AT 2.8.  Lab Results  Component Value Date   HGB 8.8 (L) 07/06/2019   HGB 9.5 (L) 07/06/2019   HGB  9.2 (L) 07/06/2019   Also his HGB dropped as low as 8.8 but has been stable at 9.5. He has very mild iron def, hemoccult x 3 was negative. He has a follow up with Dr. [name] tomorrow.  He had similar insidenous in 2019 with iron def, had EGD with Dr. 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 . Colonoscopy showed polyp in appendix, was unable to resect so underwent appendectomy, had non-bleeding diverticula as well.   Had remained on iron supplement which he reported  caused constipation so has not done as much.   He complains of sharp/burning itch on his scalp and bilateral arms, has been using DM dry skin gold bond and taking colder showers which helps. No night sweats, no rash. Has had normal phosphorus at nephrology.  Lab Results  Component Value Date   IRON 28 (L) 07/05/2019   TIBC 435 (H) 07/05/2019   FERRITIN 89 07/05/2019   Lab Results  Component Value Date   GFRNONAA 7 (L) 07/06/2019   Lab Results  Component Value Date   CALCIUM 8.6 (L) 07/06/2019   Phosphorous has been normal  Past Medical History:  Diagnosis Date  . Adrenal adenoma   . Anemia   . Anxiety   . Arthritis   . Atypical atrial flutter ([place]) 8/15, 10/15   a. DCCV 08/2013. b. s/p RFA 10/2013.  [name] Basal cell carcinoma   . CAD (coronary artery disease)    a. 04/2013 CABG x 2: LIMA to LAD, SVG to RI, EVH via R thigh.  . Cellulitis 12/2015   left leg  . Chronic diastolic congestive heart failure ([place])   . CKD (chronic kidney disease), stage III   . Depression   . Diabetes mellitus type II   . Diverticulosis 2001  . DJD (degenerative joint disease)   . GERD (gastroesophageal reflux disease)   . Gout   . H/O hiatal hernia   . History of cardioversion    x3 (years uncertain)  . Hx of adenomatous colonic polyps   . Hyperlipidemia   . Hypertension   . Hypertensive cardiomyopathy ([place])   . Hypertensive retinopathy    OU  . Macular degeneration    OU  . Obstructive sleep apnea    compliant with CPAP  . 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 Persistent atrial fibrillation ([place])    a. s/p MAZE 04/2013 in setting of CABG. b. Amio stopped in 10/2013 after flutter ablation.  [name] PFO (patent foramen ovale)    a. Small PFO by TEE 10/2013.  [name] Pleural effusion, left    a. s/p thoracentesis 05/2013.  [name] Respiratory failure ([place])    a. Hypoxia 10/2013 - required supp O2  as inpatient, did not require it at discharge.  . S/P Maze operation for atrial fibrillation    a. 04/2013: Complete bilateral atrial lesion set using cryothermy and bipolar radiofrequency ablation with clipping of LA appendage (@ time of CABG)  . S/P Maze operation for atrial fibrillation 04/05/2013   Complete bilateral atrial lesion set using cryothermy and bipolar radiofrequency ablation with clipping of LA appendage via median sternotomy approach      Allergies  Allergen Reactions  . Sunflower Seed [Sunflower Oil] Swelling and Other (See Comments)    Tongue and lip swelling  . Horse-Derived Products Other (See Comments)    Per allergy skin test UNSPECIFIED  REACTION   . Tetanus Toxoids Other (See Comments)    Per allergy skin test  . Tetanus Toxoid     Other reaction(s): Other (See Comments) Rash(horse serum)     Current Outpatient Medications (Endocrine & Metabolic):  .  insulin NPH-regular Human (70-30) 100 UNIT/ML injection, Inject 25-45 Units into the skin See admin instructions. 45 units in the morning and 25 units in the evening   Current Outpatient Medications (Cardiovascular):  .  atorvastatin (LIPITOR) 20 MG tablet, Take 1 tablet 3 x /week (Patient taking differently: Take 20 mg by mouth 3 (three) times a week. Take 1 tablet 3 x /week) .  carvedilol (COREG) 6.25 MG tablet, Take 0.5 tablets (3.125 mg total) by mouth in the morning AND 1 tablet (6.25 mg total) every evening. .  metolazone (ZAROXOLYN) 2.5 MG tablet, TAKE 1 TABLET BY MOUTH  DAILY AS NEEDED FOR  SWELLING (Patient taking differently: Take 2.5 mg by mouth daily as needed (swelling). ) .  torsemide (DEMADEX) 100 MG tablet, Take 150 mg by mouth daily.  Current Outpatient Medications (Respiratory):  .  azelastine (ASTELIN) 0.1 % nasal spray, Place 2 sprays into both nostrils 2 (two) times daily as needed for rhinitis or allergies. .  diphenhydrAMINE (BENADRYL) 25 MG tablet, Take 25 mg by mouth at bedtime as needed for  allergies.  .  fluticasone (FLONASE) 50 MCG/ACT nasal spray, Place 1 spray into both nostrils daily as needed for allergies. [name]  loratadine (CLARITIN) 10 MG tablet, Take 10 mg by mouth daily as needed for allergies.   Current Outpatient Medications (Analgesics):  .  acetaminophen (TYLENOL) 500 MG tablet, Take 1,000 mg by mouth every 6 (six) hours as needed for moderate pain or headache.  .  allopurinol (ZYLOPRIM) 300 MG tablet, Take 150 mg by mouth daily. [name]  aspirin EC 81 MG tablet, Take 81 mg by mouth daily.  Current Outpatient Medications (Hematological):  .  ferrous sulfate 325 (65 FE) MG EC tablet, Take 325 mg by mouth 3 (three) times a week. Take with vitamin C  .  warfarin (COUMADIN) 5 MG tablet, Take 5-7.5 mg by mouth See admin instructions. 1.5 tablets 4 days a week. Alternating with 1 tablets on MWF (total 9 tabs/week)   Current Outpatient Medications (Other):  [name]  ALPRAZolam (XANAX) 1 MG tablet, Take 0.5 tablets (0.5 mg total) by mouth at bedtime as needed for sleep. [name]  Alum Hydroxide-Mag Carbonate (GAVISCON PO), Take 4 tablets by mouth daily as needed (acid reflux).  .  B Complex Vitamins (VITAMIN B COMPLEX PO), Take 1 tablet by mouth at bedtime.  .  bacitracin 500 UNIT/GM ointment, Apply 1 application topically daily as needed for wound care. .  Blood Glucose Monitoring Suppl (ACCU-CHEK AVIVA PLUS) w/Device KIT, Check blood sugar 1 time  daily .  Blood Glucose Monitoring Suppl DEVI, Test blood sugar up to three times a day or as directed. .  Cholecalciferol (VITAMIN D PO), Take 5,000 Units by mouth daily.  .  Continuous Blood Gluc Receiver (FREESTYLE LIBRE 14 DAY READER) DEVI, 1 Device by Does not apply route 3 (three) times daily. Dx: E11.22, N18.4, Z79.4 .  Continuous Blood Gluc Sensor (FREESTYLE LIBRE 14 DAY SENSOR) MISC, Check blood sugar 3 times a day. Dx: E11.22, N18.4, Z79.4 .  glucose blood (ACCU-CHEK AVIVA PLUS) test strip, CHECK BLOOD GLUCOSE 3 TIMES DAILY. [name]  lidocaine  (LIDODERM) 5 %, Place 1 patch onto the skin daily as needed (for pain). Remove & Discard  patch within 12 hours or as directed by MD  .  Multiple Vitamins-Minerals (PRESERVISION AREDS 2) CAPS, Take 1 capsule by mouth 2 (two) times daily.  .  polyvinyl alcohol (ARTIFICIAL TEARS) 1.4 % ophthalmic solution, Place 1 drop into both eyes daily as needed for dry eyes. .  potassium chloride SA (KLOR-CON) 20 MEQ tablet, Take 3 tablets (60 mEq total) by mouth in the morning AND 2 tablets (40 mEq total) daily after lunch AND 2 tablets (40 mEq total) every evening. .  sertraline (ZOLOFT) 100 MG tablet, Take 1 tablet Daily for Mood (Patient taking differently: Take 100 mg by mouth daily. Take 1 tablet Daily for Mood)  ROS: Review of Systems  Constitutional: Positive for malaise/fatigue. Negative for chills, diaphoresis, fever and weight loss.  HENT: Negative for congestion, hearing loss and tinnitus.   Eyes: Negative for blurred vision and double vision.  Respiratory: Positive for shortness of breath. Negative for cough, hemoptysis, sputum production and wheezing.   Cardiovascular: Negative for chest pain, palpitations, orthopnea, claudication, leg swelling and PND.  Gastrointestinal: Negative for abdominal pain, blood in stool, constipation, diarrhea, heartburn, melena, nausea and vomiting.  Genitourinary: Negative.   Musculoskeletal: Negative for joint pain and myalgias.  Skin: Positive for itching (worse with hot shower, better with lotion). Negative for rash.  Neurological: Negative for dizziness, tingling, sensory change, weakness and headaches.  Endo/Heme/Allergies: Negative for polydipsia. Bruises/bleeds easily.  Psychiatric/Behavioral: Negative.   All other systems reviewed and are negative.    Physical Exam:  BP 110/60   Pulse (!) 57   Temp (!) 97.5 F (36.4 C)   Wt 186 lb (84.4 kg)   SpO2 98%   BMI 25.94 kg/m   General Appearance: Chronically ill appearing elderly male, well dressed, in  no apparent distress. Eyes: PERRLA, EOMs, conjunctiva no swelling or erythema Sinuses: No Frontal/maxillary tenderness ENT/Mouth: Ext aud canals clear, TMs without erythema, bulging. No erythema, swelling, or exudate on post pharynx.  Tonsils not swollen or erythematous. Hearing normal.  Neck: Supple, thyroid normal.  Respiratory: Respiratory effort normal, BS equal bilaterally without rales, rhonchi, wheezing or stridor.  Cardio: RRR with no MRGs, systolic murmur. Brisk peripheral pulses with trace bilateral edema.  Abdomen: Soft, obese/mildly distended, + BS.  Non tender, no guarding, rebound, hernias, masses. Lymphatics: Non tender without lymphadenopathy.   Musculoskeletal: Full ROM, 5/5 strength, normal gait.  Skin: Warm, dry without rashes, lesions; he has fragile skin and numerous small ecchymoses to bilateral upper extremities.  Neuro: Cranial nerves intact. Normal muscle tone, no cerebellar symptoms. Sensation intact.  Psych: Awake and oriented X 3, normal affect, Insight and Judgment appropriate.    Vicie Mutters, PA-C 4:19 PM [facility]

## 2019-07-12 NOTE — Progress Notes (Signed)
Patient was leaving after iron and had a "dizzy spell" in the hallway. Tech assisted him to a nearby chair, no fall, no injury, no LOC. He stated to me "this happens a lot, I am already feeling better". Stated it was not related to his iron today because it happens at home and different times not r/t the Iron. VS taken, WNL and he recovered. He was taken by wc to lobby.

## 2019-07-13 DIAGNOSIS — M545 Low back pain: Secondary | ICD-10-CM | POA: Diagnosis not present

## 2019-07-13 DIAGNOSIS — M1990 Unspecified osteoarthritis, unspecified site: Secondary | ICD-10-CM | POA: Diagnosis not present

## 2019-07-13 DIAGNOSIS — M109 Gout, unspecified: Secondary | ICD-10-CM | POA: Diagnosis not present

## 2019-07-13 DIAGNOSIS — N189 Chronic kidney disease, unspecified: Secondary | ICD-10-CM | POA: Diagnosis not present

## 2019-07-13 DIAGNOSIS — M79643 Pain in unspecified hand: Secondary | ICD-10-CM | POA: Diagnosis not present

## 2019-07-17 ENCOUNTER — Other Ambulatory Visit: Payer: Self-pay

## 2019-07-17 ENCOUNTER — Ambulatory Visit (HOSPITAL_COMMUNITY)
Admission: RE | Admit: 2019-07-17 | Discharge: 2019-07-17 | Disposition: A | Discharge status: Home / Self Care | Payer: Medicare Other | Source: Ambulatory Visit | Attending: Cardiology | Admitting: Cardiology

## 2019-07-17 DIAGNOSIS — I5032 Chronic diastolic (congestive) heart failure: Secondary | ICD-10-CM | POA: Insufficient documentation

## 2019-07-17 LAB — BASIC METABOLIC PANEL
Anion gap: 10 (ref 5–15)
BUN: 22 mg/dL (ref 8–23)
CO2: 32 mmol/L (ref 22–32)
Calcium: 8.5 mg/dL — ABNORMAL LOW (ref 8.9–10.3)
Chloride: 99 mmol/L (ref 98–111)
Creatinine, Ser: 1.7 mg/dL — ABNORMAL HIGH (ref 0.61–1.24)
GFR calc Af Amer: 43 mL/min — ABNORMAL LOW (ref >60)
GFR calc non Af Amer: 37 mL/min — ABNORMAL LOW (ref >60)
Glucose, Bld: 221 mg/dL — ABNORMAL HIGH (ref 70–99)
Potassium: 3.1 mmol/L — ABNORMAL LOW (ref 3.5–5.1)
Sodium: 141 mmol/L (ref 135–145)

## 2019-07-19 ENCOUNTER — Inpatient Hospital Stay: Payer: Medicare Other

## 2019-07-19 ENCOUNTER — Other Ambulatory Visit: Payer: Self-pay

## 2019-07-19 VITALS — BP 114/64 | HR 54 | Temp 98.2°F | Resp 16

## 2019-07-19 DIAGNOSIS — N183 Chronic kidney disease, stage 3 unspecified: Secondary | ICD-10-CM | POA: Diagnosis not present

## 2019-07-19 DIAGNOSIS — D509 Iron deficiency anemia, unspecified: Secondary | ICD-10-CM

## 2019-07-19 DIAGNOSIS — D631 Anemia in chronic kidney disease: Secondary | ICD-10-CM | POA: Diagnosis not present

## 2019-07-19 MED ORDER — FAMOTIDINE 20 MG PO TABS
20.0000 mg | ORAL_TABLET | Freq: Once | ORAL | Status: AC
  Administered 2019-07-19: 20 mg via ORAL

## 2019-07-19 MED ORDER — SODIUM CHLORIDE 0.9 % IV SOLN
750.0000 mg | Freq: Once | INTRAVENOUS | Status: AC
  Administered 2019-07-19: 750 mg via INTRAVENOUS
  Filled 2019-07-19: qty 15

## 2019-07-19 MED ORDER — LORATADINE 10 MG PO TABS: 10.0000 mg | ORAL_TABLET | Freq: Once | ORAL | Status: DC

## 2019-07-19 MED ORDER — ACETAMINOPHEN 325 MG PO TABS: 650.0000 mg | ORAL_TABLET | Freq: Once | ORAL | Status: DC

## 2019-07-19 MED ORDER — SODIUM CHLORIDE 0.9 % IV SOLN
Freq: Once | INTRAVENOUS | Status: AC
  Filled 2019-07-19: qty 250

## 2019-07-19 MED ORDER — FAMOTIDINE 20 MG PO TABS
ORAL_TABLET | ORAL | Status: AC
  Filled 2019-07-19: qty 1

## 2019-07-19 NOTE — Patient Instructions (Signed)

## 2019-07-20 ENCOUNTER — Telehealth (HOSPITAL_COMMUNITY): Payer: Self-pay

## 2019-07-25 ENCOUNTER — Telehealth (HOSPITAL_COMMUNITY): Payer: Self-pay | Admitting: *Deleted

## 2019-07-25 DIAGNOSIS — I5032 Chronic diastolic (congestive) heart failure: Secondary | ICD-10-CM

## 2019-07-25 NOTE — Telephone Encounter (Signed)
Pt aware, agreeable, and verbalized understanding, repeat bmet sch for 7/28

## 2019-07-25 NOTE — Telephone Encounter (Signed)
-----   Message from Jolaine Artist, MD sent at 07/25/2019  9:23 AM EDT ----- Please have him take an extra 20 kcl every day. (take 40 extra on the first day only) repeat BMET in 1 week

## 2019-08-02 ENCOUNTER — Other Ambulatory Visit (HOSPITAL_COMMUNITY): Payer: Medicare Other

## 2019-08-02 NOTE — Progress Notes (Signed)
Assessment and Plan: Medication management Patient now taking potassium 20 meq 11 a day Took metalazone yesterday Will check potassium/magnesium today May need to take extra potassium with the metolazone.   Anemia Has CKD- ? Anemia of chronic disease Following with Dr. Irene Limbo- started on iron infusion- possible abnormal kappa/gamma chains- will follow up with Dr. Irene Limbo Normal EPO  Dizziness Continue wearing compression socks Continue follow up with Dr. Irene Limbo Has improved  PAF (paroxysmal atrial fibrillation) ([place]) Check INR and will adjust medication according to labs in 1 week.  Discussed if patient falls to immediately contact office or go to ER. Discussed foods that can increase or decrease Coumadin levels.  Chronic diastolic congestive heart failure (HCC) Encouraged daily monitoring of the patient's weight Continue follow up with cardiology  Senile purpura ([place]) Discussed process, protect skin, sunscreen  CKD stage 3 due to type 2 diabetes mellitus ([place]) Monitor- check with cardiology- continue to monitor kidney function   Further disposition pending results of labs. Discussed med's effects and SE's.   Over 30 minutes of exam, counseling, chart review, and critical decision making was performed.   Future Appointments  Date Time Provider [place]  08/22/2019  2:15 PM [facility] None  09/01/2019 12:30 PM Hayden Pedro, MD [facility] None  09/13/2019  9:45 AM [facility] 5 [facility] None  09/13/2019 10:20 AM Brunetta Genera, MD [facility] None  10/11/2019 10:00 AM [name], MD [facility] None    ------------------------------------------------------------------------------------------------------------------   HPI BP 120/76   Pulse 75   Temp 97.7 F (36.5 C)   Wt 182 lb (82.6 kg)   SpO2 95%   BMI 25.38 kg/m   79 y.o.male presents for follow up on coumadin for p. A. fib, CHF, htn, CKD III/IV.  Patient had recent  [place] with Dr. Sung Amabile. He endorses dysnpea has been worse. His potassium was increase by 20 meq daily. He did take a metalzone yesterday, states weight is down 5 lbs.  Cardiology is treating carvedilol 6.25 mg - takes 1/2 tab BID due to hypotension  Wt Readings from Last 5 Encounters:  08/03/19 182 lb (82.6 kg)  07/12/19 186 lb (84.4 kg)  07/06/19 187 lb (84.8 kg)  07/05/19 185 lb 12.8 oz (84.3 kg)  07/04/19 186 lb (84.4 kg)   Lab Results  Component Value Date   CREATININE 1.70 (H) 07/17/2019   BUN 22 07/17/2019   NA 141 07/17/2019   K 3.1 (L) 07/17/2019   CL 99 07/17/2019   CO2 32 07/17/2019   He has seen Dr. Irene Limbo for anemia,  on iron infusion- states his dizziness has improved- last episode of dizziness was after his last infusion.  He is also wearing compression stockings.   Patient is on Coumadin for PAF (paroxysmal atrial fibrillation) ([place]) [I48.0] Patient's last INR is  Lab Results  Component Value Date   INR 2.1 (H) 07/06/2019   INR 2.8 (H) 07/03/2019   INR 2.8 (H) 06/29/2019    Patient denies CP,  and blood in stool/urine.  His coumadin dose was changed last visit due to acute vitreous hemorrhage of his right eye, his new goal is 2-2.5.  Current dose: changed to 1.5 tabs 3 days a week, alternating with 1 tab on 4 days a week.  He has not taken ABX, he had 1 missed doses of coumadin this past tuesday. Denies falls.  Lab Results  Component Value Date   HGB 9.4 (L) 07/12/2019   HGB 8.8 (L) 07/06/2019  HGB 9.5 (L) 07/06/2019   Also his HGB dropped as low as 8.8 but has been stable at 9.5. He has very mild iron def, hemoccult x 3 was negative. He has a follow up with Dr. [name] tomorrow.  He had similar insidenous in 2019 with iron def, had EGD with Dr. 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 . Colonoscopy showed polyp in appendix, was unable to resect so underwent appendectomy, had non-bleeding diverticula as well.   Had  remained on iron supplement which he reported caused constipation so has not done as much.   He complains of sharp/burning itch on his scalp and bilateral arms, has been using DM dry skin gold bond and taking colder showers which helps. No night sweats, no rash. Has had normal phosphorus at nephrology.  Lab Results  Component Value Date   IRON 28 (L) 07/05/2019   TIBC 435 (H) 07/05/2019   FERRITIN 89 07/05/2019   Lab Results  Component Value Date   GFRNONAA 60 (L) 07/17/2019   Lab Results  Component Value Date   CALCIUM 8.5 (L) 07/17/2019   Phosphorous has been normal  Past Medical History:  Diagnosis Date  . Adrenal adenoma   . Anemia   . Anxiety   . Arthritis   . Atypical atrial flutter ([place]) 8/15, 10/15   a. DCCV 08/2013. b. s/p RFA 10/2013.  [name] Basal cell carcinoma   . CAD (coronary artery disease)    a. 04/2013 CABG x 2: LIMA to LAD, SVG to RI, EVH via R thigh.  . Cellulitis 12/2015   left leg  . Chronic diastolic congestive heart failure ([place])   . CKD (chronic kidney disease), stage III   . Depression   . Diabetes mellitus type II   . Diverticulosis 2001  . DJD (degenerative joint disease)   . GERD (gastroesophageal reflux disease)   . Gout   . H/O hiatal hernia   . History of cardioversion    x3 (years uncertain)  . Hx of adenomatous colonic polyps   . Hyperlipidemia   . Hypertension   . Hypertensive cardiomyopathy ([place])   . Hypertensive retinopathy    OU  . Macular degeneration    OU  . Obstructive sleep apnea    compliant with CPAP  . 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 Persistent atrial fibrillation ([place])    a. s/p MAZE 04/2013 in setting of CABG. b. Amio stopped in 10/2013 after flutter ablation.  [name] PFO (patent foramen ovale)    a. Small PFO by TEE 10/2013.  [name] Pleural effusion, left    a. s/p thoracentesis 05/2013.  [name] Respiratory failure  ([place])    a. Hypoxia 10/2013 - required supp O2 as inpatient, did not require it at discharge.  . S/P Maze operation for atrial fibrillation    a. 04/2013: Complete bilateral atrial lesion set using cryothermy and bipolar radiofrequency ablation with clipping of LA appendage (@ time of CABG)  . S/P Maze operation for atrial fibrillation 04/05/2013   Complete bilateral atrial lesion set using cryothermy and bipolar radiofrequency ablation with clipping of LA appendage via median sternotomy approach      Allergies  Allergen Reactions  . Sunflower Seed [Sunflower Oil] Swelling and Other (See Comments)    Tongue and lip swelling  . Horse-Derived Products Other (See Comments)    Per allergy skin test  UNSPECIFIED REACTION   . Tetanus Toxoids Other (See Comments)    Per allergy skin test  . Tetanus Toxoid     Other reaction(s): Other (See Comments) Rash(horse serum)     Current Outpatient Medications (Endocrine & Metabolic):  .  insulin NPH-regular Human (70-30) 100 UNIT/ML injection, Inject 25-45 Units into the skin See admin instructions. 45 units in the morning and 25 units in the evening   Current Outpatient Medications (Cardiovascular):  .  atorvastatin (LIPITOR) 20 MG tablet, Take 1 tablet 3 x /week (Patient taking differently: Take 20 mg by mouth 3 (three) times a week. Take 1 tablet 3 x /week) .  carvedilol (COREG) 6.25 MG tablet, Take 0.5 tablets (3.125 mg total) by mouth in the morning AND 1 tablet (6.25 mg total) every evening. .  metolazone (ZAROXOLYN) 2.5 MG tablet, TAKE 1 TABLET BY MOUTH  DAILY AS NEEDED FOR  SWELLING (Patient taking differently: Take 2.5 mg by mouth daily as needed (swelling). ) .  torsemide (DEMADEX) 100 MG tablet, Take 150 mg by mouth daily.  Current Outpatient Medications (Respiratory):  .  azelastine (ASTELIN) 0.1 % nasal spray, Place 2 sprays into both nostrils 2 (two) times daily as needed for rhinitis or allergies. .  diphenhydrAMINE (BENADRYL) 25 MG  tablet, Take 25 mg by mouth at bedtime as needed for allergies.  .  fluticasone (FLONASE) 50 MCG/ACT nasal spray, Place 1 spray into both nostrils daily as needed for allergies. [name]  loratadine (CLARITIN) 10 MG tablet, Take 10 mg by mouth daily as needed for allergies.   Current Outpatient Medications (Analgesics):  .  acetaminophen (TYLENOL) 500 MG tablet, Take 1,000 mg by mouth every 6 (six) hours as needed for moderate pain or headache.  .  allopurinol (ZYLOPRIM) 300 MG tablet, Take 150 mg by mouth daily. [name]  aspirin EC 81 MG tablet, Take 81 mg by mouth daily.  Current Outpatient Medications (Hematological):  .  ferrous sulfate 325 (65 FE) MG EC tablet, Take 325 mg by mouth 3 (three) times a week. Take with vitamin C  .  warfarin (COUMADIN) 5 MG tablet, Take 5-7.5 mg by mouth See admin instructions. 1.5 tablets 4 days a week. Alternating with 1 tablets on MWF (total 9 tabs/week)   Current Outpatient Medications (Other):  [name]  ALPRAZolam (XANAX) 1 MG tablet, Take 0.5 tablets (0.5 mg total) by mouth at bedtime as needed for sleep. [name]  Alum Hydroxide-Mag Carbonate (GAVISCON PO), Take 4 tablets by mouth daily as needed (acid reflux).  .  B Complex Vitamins (VITAMIN B COMPLEX PO), Take 1 tablet by mouth at bedtime.  .  bacitracin 500 UNIT/GM ointment, Apply 1 application topically daily as needed for wound care. .  Blood Glucose Monitoring Suppl (ACCU-CHEK AVIVA PLUS) w/Device KIT, Check blood sugar 1 time  daily .  Blood Glucose Monitoring Suppl DEVI, Test blood sugar up to three times a day or as directed. .  Cholecalciferol (VITAMIN D PO), Take 5,000 Units by mouth daily.  .  Continuous Blood Gluc Receiver (FREESTYLE LIBRE 14 DAY READER) DEVI, 1 Device by Does not apply route 3 (three) times daily. Dx: E11.22, N18.4, Z79.4 .  Continuous Blood Gluc Sensor (FREESTYLE LIBRE 14 DAY SENSOR) MISC, Check blood sugar 3 times a day. Dx: E11.22, N18.4, Z79.4 .  glucose blood (ACCU-CHEK AVIVA PLUS) test  strip, CHECK BLOOD GLUCOSE 3 TIMES DAILY. [name]  lidocaine (LIDODERM) 5 %, Place 1 patch onto the skin daily as needed (for pain). Remove &  Discard patch within 12 hours or as directed by MD  .  Multiple Vitamins-Minerals (PRESERVISION AREDS 2) CAPS, Take 1 capsule by mouth 2 (two) times daily.  .  polyvinyl alcohol (ARTIFICIAL TEARS) 1.4 % ophthalmic solution, Place 1 drop into both eyes daily as needed for dry eyes. .  potassium chloride SA (KLOR-CON) 20 MEQ tablet, Take 3 tablets (60 mEq total) by mouth in the morning AND 2 tablets (40 mEq total) daily after lunch AND 2 tablets (40 mEq total) every evening. .  sertraline (ZOLOFT) 100 MG tablet, Take 1 tablet Daily for Mood (Patient taking differently: Take 100 mg by mouth daily. Take 1 tablet Daily for Mood)  ROS: Review of Systems  Constitutional: Positive for malaise/fatigue. Negative for chills, diaphoresis, fever and weight loss.  HENT: Negative for congestion, hearing loss and tinnitus.   Eyes: Negative for blurred vision and double vision.  Respiratory: Positive for shortness of breath. Negative for cough, hemoptysis, sputum production and wheezing.   Cardiovascular: Negative for chest pain, palpitations, orthopnea, claudication, leg swelling and PND.  Gastrointestinal: Negative for abdominal pain, blood in stool, constipation, diarrhea, heartburn, melena, nausea and vomiting.  Genitourinary: Negative.   Musculoskeletal: Negative for joint pain and myalgias.  Skin: Positive for itching (worse with hot shower, better with lotion). Negative for rash.  Neurological: Negative for dizziness, tingling, sensory change, weakness and headaches.  Endo/Heme/Allergies: Negative for polydipsia. Bruises/bleeds easily.  Psychiatric/Behavioral: Negative.   All other systems reviewed and are negative.    Physical Exam:  BP 120/76   Pulse 75   Temp 97.7 F (36.5 C)   Wt 182 lb (82.6 kg)   SpO2 95%   BMI 25.38 kg/m   General Appearance:  Chronically ill appearing elderly male, well dressed, in no apparent distress. Eyes: PERRLA, EOMs, conjunctiva no swelling or erythema Sinuses: No Frontal/maxillary tenderness ENT/Mouth: Ext aud canals clear, TMs without erythema, bulging. No erythema, swelling, or exudate on post pharynx.  Tonsils not swollen or erythematous. Hearing normal.  Neck: Supple, thyroid normal.  Respiratory: Respiratory effort normal, BS equal bilaterally without rales, rhonchi, wheezing or stridor.  Cardio: RRR with no MRGs, systolic murmur. Brisk peripheral pulses with trace bilateral edema.  Abdomen: Soft, obese/mildly distended, + BS.  Non tender, no guarding, rebound, hernias, masses. Lymphatics: Non tender without lymphadenopathy.   Musculoskeletal: Full ROM, 5/5 strength, normal gait.  Skin: Warm, dry without rashes, lesions; he has fragile skin and numerous small ecchymoses to bilateral upper extremities.  Neuro: Cranial nerves intact. Normal muscle tone, no cerebellar symptoms. Sensation intact.  Psych: Awake and oriented X 3, normal affect, Insight and Judgment appropriate.    Vicie Mutters, PA-C 2:46 PM [facility]

## 2019-08-03 ENCOUNTER — Ambulatory Visit (INDEPENDENT_AMBULATORY_CARE_PROVIDER_SITE_OTHER): Payer: Medicare Other | Admitting: Physician Assistant

## 2019-08-03 ENCOUNTER — Encounter: Payer: Self-pay | Admitting: Physician Assistant

## 2019-08-03 ENCOUNTER — Other Ambulatory Visit: Payer: Self-pay

## 2019-08-03 VITALS — BP 120/76 | HR 75 | Temp 97.7°F | Wt 182.0 lb

## 2019-08-03 DIAGNOSIS — D692 Other nonthrombocytopenic purpura: Secondary | ICD-10-CM

## 2019-08-03 DIAGNOSIS — D649 Anemia, unspecified: Secondary | ICD-10-CM | POA: Diagnosis not present

## 2019-08-03 DIAGNOSIS — Z7901 Long term (current) use of anticoagulants: Secondary | ICD-10-CM

## 2019-08-03 DIAGNOSIS — I480 Paroxysmal atrial fibrillation: Secondary | ICD-10-CM | POA: Diagnosis not present

## 2019-08-03 DIAGNOSIS — I272 Pulmonary hypertension, unspecified: Secondary | ICD-10-CM | POA: Diagnosis not present

## 2019-08-03 DIAGNOSIS — Z79899 Other long term (current) drug therapy: Secondary | ICD-10-CM

## 2019-08-03 DIAGNOSIS — I5032 Chronic diastolic (congestive) heart failure: Secondary | ICD-10-CM | POA: Diagnosis not present

## 2019-08-03 DIAGNOSIS — I4892 Unspecified atrial flutter: Secondary | ICD-10-CM

## 2019-08-03 NOTE — Patient Instructions (Signed)

## 2019-08-04 LAB — CBC WITH DIFFERENTIAL/PLATELET
Absolute Monocytes: 696 cells/uL (ref 200–950)
Basophils Absolute: 33 cells/uL (ref 0–200)
Basophils Relative: 0.5 %
Eosinophils Absolute: 91 cells/uL (ref 15–500)
Eosinophils Relative: 1.4 %
HCT: 38.7 % (ref 38.5–50.0)
Hemoglobin: 11.9 g/dL — ABNORMAL LOW (ref 13.2–17.1)
Lymphs Abs: 410 cells/uL — ABNORMAL LOW (ref 850–3900)
MCH: 27.1 pg (ref 27.0–33.0)
MCHC: 30.7 g/dL — ABNORMAL LOW (ref 32.0–36.0)
MCV: 88.2 fL (ref 80.0–100.0)
MPV: 8.8 fL (ref 7.5–12.5)
Monocytes Relative: 10.7 %
Neutro Abs: 5272 cells/uL (ref 1500–7800)
Neutrophils Relative %: 81.1 %
Platelets: 205 10*3/uL (ref 140–400)
RBC: 4.39 10*6/uL (ref 4.20–5.80)
RDW: 20.2 % — ABNORMAL HIGH (ref 11.0–15.0)
Total Lymphocyte: 6.3 %
WBC: 6.5 10*3/uL (ref 3.8–10.8)

## 2019-08-04 LAB — COMPLETE METABOLIC PANEL WITH GFR
AG Ratio: 1.5 (calc) (ref 1.0–2.5)
ALT: 21 U/L (ref 9–46)
AST: 23 U/L (ref 10–35)
Albumin: 4.3 g/dL (ref 3.6–5.1)
Alkaline phosphatase (APISO): 123 U/L (ref 35–144)
BUN/Creatinine Ratio: 17 (calc) (ref 6–22)
BUN: 30 mg/dL — ABNORMAL HIGH (ref 7–25)
CO2: 29 mmol/L (ref 20–32)
Calcium: 8.6 mg/dL (ref 8.6–10.3)
Chloride: 101 mmol/L (ref 98–110)
Creat: 1.74 mg/dL — ABNORMAL HIGH (ref 0.70–1.11)
GFR, Est African American: 42 mL/min/{1.73_m2} — ABNORMAL LOW (ref >=60)
GFR, Est Non African American: 36 mL/min/{1.73_m2} — ABNORMAL LOW (ref >=60)
Globulin: 2.8 g/dL (calc) (ref 1.9–3.7)
Glucose, Bld: 107 mg/dL — ABNORMAL HIGH (ref 65–99)
Potassium: 3.7 mmol/L (ref 3.5–5.3)
Sodium: 141 mmol/L (ref 135–146)
Total Bilirubin: 0.6 mg/dL (ref 0.2–1.2)
Total Protein: 7.1 g/dL (ref 6.1–8.1)

## 2019-08-04 LAB — PROTIME-INR
INR: 1.2 — ABNORMAL HIGH
Prothrombin Time: 13 s — ABNORMAL HIGH (ref 9.0–11.5)

## 2019-08-04 LAB — MAGNESIUM: Magnesium: 2.4 mg/dL (ref 1.5–2.5)

## 2019-08-16 DIAGNOSIS — G4733 Obstructive sleep apnea (adult) (pediatric): Secondary | ICD-10-CM | POA: Diagnosis not present

## 2019-08-22 ENCOUNTER — Other Ambulatory Visit: Payer: Self-pay

## 2019-08-22 ENCOUNTER — Encounter (HOSPITAL_COMMUNITY): Payer: Self-pay

## 2019-08-22 ENCOUNTER — Encounter (HOSPITAL_COMMUNITY)
Admission: RE | Admit: 2019-08-22 | Discharge: 2019-08-22 | Disposition: A | Discharge status: Home / Self Care | Payer: Medicare Other | Source: Ambulatory Visit | Attending: Internal Medicine | Admitting: Internal Medicine

## 2019-08-22 VITALS — BP 106/64 | HR 68 | Wt 184.5 lb

## 2019-08-22 DIAGNOSIS — I5032 Chronic diastolic (congestive) heart failure: Secondary | ICD-10-CM | POA: Diagnosis not present

## 2019-08-22 NOTE — Progress Notes (Signed)
[name]. 79 y.o. male Pulmonary Rehab Orientation Note This patient who was referred to Pulmonary rehab by [name] with the diagnosis of Chronic diastolic congestive heart failure arrived today in Cardiac and Pulmonary Rehab. He walked in from the [facility] parking lot with minimal shortness of breath. He does not carry portable oxygen. Per pt, he uses oxygen never. Color good, skin warm and dry. Patient is oriented to time and place. Patient's medical history, psychosocial health, and medications reviewed. Psychosocial assessment reveals pt lives with their spouse. Pt is currently retired. He retired as a Software engineer in 2016. Pt hobbies include wood working and fishing, but he states he is unable to take his boat out to fish very often anymore. Pt reports his stress level is low. Areas of stress/anxiety include Health. It stresses him out that he is unable to do some of the activities that he could do when he was younger.  Pt does not show any signs of depression, but has a history of depression and is treated with Zoloft. PHQ2/9 score 0/1. Pt shows good  coping skills with positive outlook . Pt offered emotional support and reassurance. Will continue to monitor and evaluate progress toward psychosocial goal(s). Patient reports he does take medications as prescribed. Patient states he follows a Low Sodium diet. The patient reports no specific efforts to gain or lose weight. Patient's weight will be monitored closely. Demonstration and practice of PLB using pulse oximeter. Patient able to return demonstration satisfactorily. Safety and hand hygiene in the exercise area reviewed with patient. Patient voices understanding of the information reviewed. Department expectations discussed with patient and achievable goals were set. The patient shows enthusiasm about attending the program and we look forward to working with this nice man. The patient completed his six minute walk test today and will begin exercise  08/29/2019 in the 1:15 class slot.   45 minutes was spent on a variety of activities such as assessment of the patient, obtaining baseline data including height, weight, BMI, and grip strength, verifying medical history, allergies, and current medications, and teaching patient strategies for performing tasks with less respiratory effort with emphasis on pursed lip breathing. [id number]

## 2019-08-22 NOTE — Progress Notes (Signed)
Pulmonary Rehab Orientation Physical Assessment Note  Physical assessment reveals heart rate is normal, breath sounds with rales throughout on the right side and 1/2 way up on the left. Grip strength equal, strong. Distal pulses palpable.[name] is excited to be attending Pulmonary Rehab again. He is looking forward to exercising again and increasing his strength and endurance. We are looking forward to working with him to meet his Pulmonary Rehab goals.

## 2019-08-22 NOTE — Progress Notes (Signed)
Pulmonary Individual Treatment Plan  Patient Details  Name: [name]. MRN: [id number] Date of Birth: Dec 16, 1940 Referring Provider:    Initial Encounter Date:   Visit Diagnosis: Heart failure, diastolic, chronic (HCC)  Chronic diastolic (congestive) heart failure ([place])  Patient's Home Medications on Admission:   Current Outpatient Medications:  .  acetaminophen (TYLENOL) 500 MG tablet, Take 1,000 mg by mouth every 6 (six) hours as needed for moderate pain or headache. , Disp: , Rfl:  .  allopurinol (ZYLOPRIM) 300 MG tablet, Take 150 mg by mouth daily., Disp: , Rfl:  .  ALPRAZolam (XANAX) 1 MG tablet, Take 0.5 tablets (0.5 mg total) by mouth at bedtime as needed for sleep., Disp: 60 tablet, Rfl: 0 .  Alum Hydroxide-Mag Carbonate (GAVISCON PO), Take 4 tablets by mouth daily as needed (acid reflux). , Disp: , Rfl:  .  aspirin EC 81 MG tablet, Take 81 mg by mouth daily., Disp: , Rfl:  .  atorvastatin (LIPITOR) 20 MG tablet, Take 1 tablet 3 x /week (Patient taking differently: Take 20 mg by mouth 3 (three) times a week. Take 1 tablet 3 x /week), Disp: 36 tablet, Rfl: 3 .  azelastine (ASTELIN) 0.1 % nasal spray, Place 2 sprays into both nostrils 2 (two) times daily as needed for rhinitis or allergies., Disp: , Rfl:  .  B Complex Vitamins (VITAMIN B COMPLEX PO), Take 1 tablet by mouth at bedtime. , Disp: , Rfl:  .  bacitracin 500 UNIT/GM ointment, Apply 1 application topically daily as needed for wound care., Disp: , Rfl:  .  Blood Glucose Monitoring Suppl (ACCU-CHEK AVIVA PLUS) w/Device KIT, Check blood sugar 1 time  daily, Disp: 1 kit, Rfl: 0 .  Blood Glucose Monitoring Suppl DEVI, Test blood sugar up to three times a day or as directed., Disp: 1 each, Rfl: 0 .  carvedilol (COREG) 6.25 MG tablet, Take 0.5 tablets (3.125 mg total) by mouth in the morning AND 1 tablet (6.25 mg total) every evening., Disp: 270 tablet, Rfl: 1 .  Cholecalciferol (VITAMIN D PO), Take 5,000 Units by mouth  daily. , Disp: , Rfl:  .  Continuous Blood Gluc Receiver (FREESTYLE LIBRE 14 DAY READER) DEVI, 1 Device by Does not apply route 3 (three) times daily. Dx: E11.22, N18.4, Z79.4, Disp: 1 each, Rfl: 0 .  Continuous Blood Gluc Sensor (FREESTYLE LIBRE 14 DAY SENSOR) MISC, Check blood sugar 3 times a day. Dx: E11.22, N18.4, Z79.4, Disp: 2 each, Rfl: 12 .  diphenhydrAMINE (BENADRYL) 25 MG tablet, Take 25 mg by mouth at bedtime as needed for allergies. , Disp: , Rfl:  .  ferrous sulfate 325 (65 FE) MG EC tablet, Take 325 mg by mouth 3 (three) times a week. Take with vitamin C , Disp: , Rfl:  .  fluticasone (FLONASE) 50 MCG/ACT nasal spray, Place 1 spray into both nostrils daily as needed for allergies., Disp: 16 g, Rfl: 3 .  glucose blood (ACCU-CHEK AVIVA PLUS) test strip, CHECK BLOOD GLUCOSE 3 TIMES DAILY., Disp: 300 strip, Rfl: 3 .  insulin NPH-regular Human (70-30) 100 UNIT/ML injection, Inject 25-45 Units into the skin See admin instructions. 45 units in the morning and 25 units in the evening , Disp: , Rfl:  .  lidocaine (LIDODERM) 5 %, Place 1 patch onto the skin daily as needed (for pain). Remove & Discard patch within 12 hours or as directed by MD , Disp: , Rfl:  .  loratadine (CLARITIN) 10 MG tablet, Take 10  mg by mouth daily as needed for allergies. , Disp: , Rfl:  .  metolazone (ZAROXOLYN) 2.5 MG tablet, TAKE 1 TABLET BY MOUTH  DAILY AS NEEDED FOR  SWELLING (Patient taking differently: Take 2.5 mg by mouth daily as needed (swelling). ), Disp: 60 tablet, Rfl: 0 .  Multiple Vitamins-Minerals (PRESERVISION AREDS 2) CAPS, Take 1 capsule by mouth 2 (two) times daily. , Disp: , Rfl:  .  polyvinyl alcohol (ARTIFICIAL TEARS) 1.4 % ophthalmic solution, Place 1 drop into both eyes daily as needed for dry eyes., Disp: , Rfl:  .  potassium chloride SA (KLOR-CON) 20 MEQ tablet, Take 3 tablets (60 mEq total) by mouth in the morning AND 2 tablets (40 mEq total) daily after lunch AND 2 tablets (40 mEq total) every  evening., Disp: 540 tablet, Rfl: 3 .  sertraline (ZOLOFT) 100 MG tablet, Take 1 tablet Daily for Mood (Patient taking differently: Take 100 mg by mouth daily. Take 1 tablet Daily for Mood), Disp: 90 tablet, Rfl: 3 .  torsemide (DEMADEX) 100 MG tablet, Take 150 mg by mouth daily., Disp: , Rfl:  .  warfarin (COUMADIN) 5 MG tablet, Take 5-7.5 mg by mouth See admin instructions. 1.5 tablets 4 days a week. Alternating with 1 tablets on MWF (total 9 tabs/week) , Disp: , Rfl:   Past Medical History: Past Medical History:  Diagnosis Date  . Adrenal adenoma   . Anemia   . Anxiety   . Arthritis   . Atypical atrial flutter ([place]) 8/15, 10/15   a. DCCV 08/2013. b. s/p RFA 10/2013.  [name] Basal cell carcinoma   . CAD (coronary artery disease)    a. 04/2013 CABG x 2: LIMA to LAD, SVG to RI, EVH via R thigh.  . Cellulitis 12/2015   left leg  . Chronic diastolic congestive heart failure ([place])   . CKD (chronic kidney disease), stage III   . Depression   . Diabetes mellitus type II   . Diverticulosis 2001  . DJD (degenerative joint disease)   . GERD (gastroesophageal reflux disease)   . Gout   . H/O hiatal hernia   . History of cardioversion    x3 (years uncertain)  . Hx of adenomatous colonic polyps   . Hyperlipidemia   . Hypertension   . Hypertensive cardiomyopathy ([place])   . Hypertensive retinopathy    OU  . Macular degeneration    OU  . Obstructive sleep apnea    compliant with CPAP  . 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 Persistent atrial fibrillation ([place])    a. s/p MAZE 04/2013 in setting of CABG. b. Amio stopped in 10/2013 after flutter ablation.  [name] PFO (patent foramen ovale)    a. Small PFO by TEE 10/2013.  [name] Pleural effusion, left    a. s/p thoracentesis 05/2013.  [name] Respiratory failure ([place])    a. Hypoxia 10/2013 - required supp O2 as inpatient, did not require it at  discharge.  . S/P Maze operation for atrial fibrillation    a. 04/2013: Complete bilateral atrial lesion set using cryothermy and bipolar radiofrequency ablation with clipping of LA appendage (@ time of CABG)  . S/P Maze operation for atrial fibrillation 04/05/2013   Complete bilateral atrial lesion set using cryothermy and bipolar radiofrequency ablation with clipping of LA appendage via median sternotomy approach     Tobacco Use: Social History   Tobacco Use  Smoking Status Former Smoker  . Packs/day: 4.00  . Years: 25.00  . Pack years: 100.00  . Types: Cigarettes  . Quit date: 01/05/1981  . Years since quitting: 38.6  Smokeless Tobacco Never Used    Labs: Recent Chemical engineer    Labs for ITP Cardiac and Pulmonary Rehab Latest Ref Rng & Units 11/15/2018 03/28/2019 07/06/2019 07/06/2019 07/06/2019   Cholestrol <200 mg/dL 138 163 - - -   LDLCALC mg/dL (calc) 80 95 - - -   HDL > OR = 40 mg/dL 28(L) 31(L) - - -   Trlycerides <150 mg/dL 205(H) 257(H) - - -   Hemoglobin A1c <5.7 % of total Hgb 7.1(H) 7.4(H) - - -   PHART 7.35 - 7.45 - - - - -   PCO2ART 35 - 45 mmHg - - - - -   HCO3 20.0 - 28.0 mmol/L - - 30.4(H) 30.9(H) 30.2(H)   TCO2 22 - 32 mmol/L - - 31 32 31   ACIDBASEDEF 0.0 - 2.0 mmol/L - - - - -   O2SAT % - - 65.0 67.0 52.0      Capillary Blood Glucose: Lab Results  Component Value Date   GLUCAP 101 (H) 07/06/2019   GLUCAP 248 (H) 03/05/2017   GLUCAP 270 (H) 03/05/2017   GLUCAP 189 (H) 03/05/2017   GLUCAP 300 (H) 03/01/2017     Pulmonary Assessment Scores:  Pulmonary Assessment Scores    Row Name 08/22/19 1538 08/22/19 1603       ADL UCSD   ADL Phase Entry Entry    SOB Score total 25 --      CAT Score   CAT Score 8 --      mMRC Score   mMRC Score -- 2          UCSD: Self-administered rating of dyspnea associated with activities of daily living (ADLs) 6-point scale (0 = "not at all" to 5 = "maximal or unable to do because of breathlessness")   Scoring Scores range from 0 to 120.  Minimally important difference is 5 units  CAT: CAT can identify the health impairment of COPD patients and is better correlated with disease progression.  CAT has a scoring range of zero to 40. The CAT score is classified into four groups of low (less than 10), medium (10 - 20), high (21-30) and very high (31-40) based on the impact level of disease on health status. A CAT score over 10 suggests significant symptoms.  A worsening CAT score could be explained by an exacerbation, poor medication adherence, poor inhaler technique, or progression of COPD or comorbid conditions.  CAT MCID is 2 points  mMRC: mMRC (Modified Medical Research Council) Dyspnea Scale is used to assess the degree of baseline functional disability in patients of respiratory disease due to dyspnea. No minimal important difference is established. A decrease in score of 1 point or greater is considered a positive change.   Pulmonary Function Assessment:  Pulmonary Function Assessment - 08/22/19 1623      Breath   Bilateral Breath Sounds Rales   Rales throughout right lungand left with rales i/2 way up on the left side   Shortness of Breath Yes;Limiting activity           Exercise Target Goals: Exercise Program Goal: Individual exercise prescription set using results from initial 6 min walk test and THRR while considering  patient's activity barriers and safety.   Exercise Prescription Goal: Initial exercise prescription builds to 30-45 minutes  a day of aerobic activity, 2-3 days per week.  Home exercise guidelines will be given to patient during program as part of exercise prescription that the participant will acknowledge.  Activity Barriers & Risk Stratification:  Activity Barriers & Cardiac Risk Stratification - 08/22/19 1455      Activity Barriers & Cardiac Risk Stratification   Activity Barriers Arthritis;Neck/Spine Problems;Joint Problems;Deconditioning;Muscular  Weakness;Shortness of Breath;History of Falls    Cardiac Risk Stratification High           6 Minute Walk:  6 Minute Walk    Row Name 08/22/19 1605         6 Minute Walk   Phase Initial     Distance 1334 feet     Walk Time 6 minutes     # of Rest Breaks 0     MPH 2.53     METS 2.56     RPE 13     Perceived Dyspnea  1     VO2 Peak 8.96     Symptoms Yes (comment)  Right Hip and Knee hurt when walking due to arthritis     Resting HR 68 bpm     Resting BP 106/64     Resting Oxygen Saturation  98 %     Exercise Oxygen Saturation  during 6 min walk 92 %     Max Ex. HR 93 bpm     Max Ex. BP 136/60     2 Minute Post BP 136/64       Interval HR   1 Minute HR 92     2 Minute HR 90     3 Minute HR 93     4 Minute HR 84     5 Minute HR 87     6 Minute HR 89     2 Minute Post HR 67     Interval Heart Rate? Yes       Interval Oxygen   Interval Oxygen? Yes     Baseline Oxygen Saturation % 98 %     1 Minute Oxygen Saturation % 94 %     1 Minute Liters of Oxygen 0 L     2 Minute Oxygen Saturation % 94 %     2 Minute Liters of Oxygen 0 L     3 Minute Oxygen Saturation % 92 %     3 Minute Liters of Oxygen 0 L     4 Minute Oxygen Saturation % 92 %     4 Minute Liters of Oxygen 0 L     5 Minute Oxygen Saturation % 93 %     5 Minute Liters of Oxygen 0 L     6 Minute Oxygen Saturation % 92 %     6 Minute Liters of Oxygen 0 L     2 Minute Post Oxygen Saturation % 98 %     2 Minute Post Liters of Oxygen 0 L            Oxygen Initial Assessment:  Oxygen Initial Assessment - 08/22/19 1604      Home Oxygen   Home Oxygen Device None    Sleep Oxygen Prescription CPAP    Home Exercise Oxygen Prescription None    Home at Rest Exercise Oxygen Prescription None    Compliance with Home Oxygen Use Yes      Initial 6 min Walk   Oxygen Used None      Program Oxygen Prescription   Program Oxygen  Prescription None      Intervention   Short Term Goals To learn and exhibit  compliance with exercise, home and travel O2 prescription;To learn and understand importance of monitoring SPO2 with pulse oximeter and demonstrate accurate use of the pulse oximeter.;To learn and understand importance of maintaining oxygen saturations>88%;To learn and demonstrate proper pursed lip breathing techniques or other breathing techniques.;To learn and demonstrate proper use of respiratory medications    Long  Term Goals Exhibits compliance with exercise, home and travel O2 prescription;Verbalizes importance of monitoring SPO2 with pulse oximeter and return demonstration;Maintenance of O2 saturations>88%;Exhibits proper breathing techniques, such as pursed lip breathing or other method taught during program session;Compliance with respiratory medication;Demonstrates proper use of MDI's           Oxygen Re-Evaluation:   Oxygen Discharge (Final Oxygen Re-Evaluation):   Initial Exercise Prescription:   Perform Capillary Blood Glucose checks as needed.  Exercise Prescription Changes:   Exercise Comments:   Exercise Goals and Review:   Exercise Goals Re-Evaluation :   Discharge Exercise Prescription (Final Exercise Prescription Changes):   Nutrition:  Target Goals: Understanding of nutrition guidelines, daily intake of sodium <1579m, cholesterol <2036m calories 30% from fat and 7% or less from saturated fats, daily to have 5 or more servings of fruits and vegetables.  Biometrics:    Nutrition Therapy Plan and Nutrition Goals:   Nutrition Assessments:   Nutrition Goals Re-Evaluation:   Nutrition Goals Discharge (Final Nutrition Goals Re-Evaluation):   Psychosocial: Target Goals: Acknowledge presence or absence of significant depression and/or stress, maximize coping skills, provide positive support system. Participant is able to verbalize types and ability to use techniques and skills needed for reducing stress and depression.  Initial Review &  Psychosocial Screening:  Initial Psych Review & Screening - 08/22/19 1501      Initial Review   Current issues with Current Depression   depression managed well with Zoloft     Family Dynamics   Good Support System? Yes      Barriers   Psychosocial barriers to participate in program There are no identifiable barriers or psychosocial needs.      Screening Interventions   Interventions Encouraged to exercise    Expected Outcomes Short Term goal: Identification and review with participant of any Quality of Life or Depression concerns found by scoring the questionnaire.           Quality of Life Scores:  Scores of 19 and below usually indicate a poorer quality of life in these areas.  A difference of  2-3 points is a clinically meaningful difference.  A difference of 2-3 points in the total score of the Quality of Life Index has been associated with significant improvement in overall quality of life, self-image, physical symptoms, and general health in studies assessing change in quality of life.  PHQ-9: Recent Review Flowsheet Data    [facility] 08/22/2019 08/22/2019 03/27/2019 01/19/2019 12/18/2018   Decreased Interest 0 0 0 0 0   Down, Depressed, Hopeless 0 0 0 0 0   PHQ - 2 Score 0 0 0 0 0   Altered sleeping 0 - - - -   Tired, decreased energy 1  - - - -   Change in appetite 0 - - - -   Feeling bad or failure about yourself  0 - - - -   Trouble concentrating 0 - - - -   Moving slowly or fidgety/restless 0 - - - -  Suicidal thoughts 0 - - - -   PHQ-9 Score 1 - - - -   Difficult doing work/chores Not difficult at all - - - -     Interpretation of Total Score  Total Score Depression Severity:  1-4 = Minimal depression, 5-9 = Mild depression, 10-14 = Moderate depression, 15-19 = Moderately severe depression, 20-27 = Severe depression   Psychosocial Evaluation and Intervention:   Psychosocial Re-Evaluation:   Psychosocial Discharge (Final Psychosocial  Re-Evaluation):   Education: Education Goals: Education classes will be provided on a weekly basis, covering required topics. Participant will state understanding/return demonstration of topics presented.  Learning Barriers/Preferences:  Learning Barriers/Preferences - 08/22/19 1503      Learning Barriers/Preferences   Learning Barriers None    Learning Preferences Skilled Demonstration;Written Material           Education Topics: Risk Factor Reduction:  -Group instruction that is supported by a PowerPoint presentation. Instructor discusses the definition of a risk factor, different risk factors for pulmonary disease, and how the heart and lungs work together.     PULMONARY REHAB OTHER RESPIRATORY from 04/11/2015 in [place]  Date 03/14/15  Educator EP  Instruction Review Code (Retired) 2- meets goals/outcomes      Nutrition for Pulmonary Patient:  -Group instruction provided by [employer], verbal discussion, and written materials to support subject matter. The instructor gives an explanation and review of healthy diet recommendations, which includes a discussion on weight management, recommendations for fruit and vegetable consumption, as well as protein, fluid, caffeine, fiber, sodium, sugar, and alcohol. Tips for eating when patients are short of breath are discussed.   PULMONARY REHAB OTHER RESPIRATORY from 04/11/2015 in [place]  Date 03/21/15  Educator rd  Instruction Review Code (Retired) 2- meets goals/outcomes      Pursed Lip Breathing:  -Group instruction that is supported by demonstration and informational handouts. Instructor discusses the benefits of pursed lip and diaphragmatic breathing and detailed demonstration on how to preform both.     Oxygen Safety:  -Group instruction provided by PowerPoint, verbal discussion, and written material to support subject matter. There is an overview of  "What is Oxygen" and "Why do we need it".  Instructor also reviews how to create a safe environment for oxygen use, the importance of using oxygen as prescribed, and the risks of noncompliance. There is a brief discussion on traveling with oxygen and resources the patient may utilize.   PULMONARY REHAB OTHER RESPIRATORY from 04/11/2015 in [place]  Date 04/11/15  Educator Trish Fountain  Instruction Review Code (Retired) 2- meets goals/outcomes      Oxygen Equipment:  -Group instruction provided by [employer], Network engineer, and Insurance underwriter.   Signs and Symptoms:  -Group instruction provided by written material and verbal discussion to support subject matter. Warning signs and symptoms of infection, stroke, and heart attack are reviewed and when to call the physician/911 reinforced. Tips for preventing the spread of infection discussed.   Advanced Directives:  -Group instruction provided by verbal instruction and written material to support subject matter. Instructor reviews Advanced Directive laws and proper instruction for filling out document.   Pulmonary Video:  -Group video education that reviews the importance of medication and oxygen compliance, exercise, good nutrition, pulmonary hygiene, and pursed lip and diaphragmatic breathing for the pulmonary patient.   Exercise for the Pulmonary Patient:  -Group instruction that is supported  by a PowerPoint presentation. Instructor discusses benefits of exercise, core components of exercise, frequency, duration, and intensity of an exercise routine, importance of utilizing pulse oximetry during exercise, safety while exercising, and options of places to exercise outside of rehab.     Pulmonary Medications:  -Verbally interactive group education provided by instructor with focus on inhaled medications and proper administration.   PULMONARY REHAB OTHER RESPIRATORY from  04/11/2015 in [place]  Date 04/04/15  Educator Pharm D  Instruction Review Code (Retired) 2- Lawyer and Physiology of the Respiratory System and Intimacy:  -Group instruction provided by PowerPoint, verbal discussion, and written material to support subject matter. Instructor reviews respiratory cycle and anatomical components of the respiratory system and their functions. Instructor also reviews differences in obstructive and restrictive respiratory diseases with examples of each. Intimacy, Sex, and Sexuality differences are reviewed with a discussion on how relationships can change when diagnosed with pulmonary disease. Common sexual concerns are reviewed.   MD DAY -A group question and answer session with a medical doctor that allows participants to ask questions that relate to their pulmonary disease state.   OTHER EDUCATION -Group or individual verbal, written, or video instructions that support the educational goals of the pulmonary rehab program.   Holiday Eating Survival Tips:  -Group instruction provided by PowerPoint slides, verbal discussion, and written materials to support subject matter. The instructor gives patients tips, tricks, and techniques to help them not only survive but enjoy the holidays despite the onslaught of food that accompanies the holidays.   Knowledge Questionnaire Score:  Knowledge Questionnaire Score - 08/22/19 1550      Knowledge Questionnaire Score   Pre Score 15/18           Core Components/Risk Factors/Patient Goals at Admission:  Personal Goals and Risk Factors at Admission - 08/22/19 1504      Core Components/Risk Factors/Patient Goals on Admission   Improve shortness of breath with ADL's Yes    Intervention Provide education, individualized exercise plan and daily activity instruction to help decrease symptoms of SOB with activities of daily living.    Expected Outcomes Short  Term: Improve cardiorespiratory fitness to achieve a reduction of symptoms when performing ADLs;Long Term: Be able to perform more ADLs without symptoms or delay the onset of symptoms    Diabetes Yes    Intervention Provide education about signs/symptoms and action to take for hypo/hyperglycemia.;Provide education about proper nutrition, including hydration, and aerobic/resistive exercise prescription along with prescribed medications to achieve blood glucose in normal ranges: Fasting glucose 65-99 mg/dL    Expected Outcomes Long Term: Attainment of HbA1C < 7%.;Short Term: Participant verbalizes understanding of the signs/symptoms and immediate care of hyper/hypoglycemia, proper foot care and importance of medication, aerobic/resistive exercise and nutrition plan for blood glucose control.    Heart Failure Yes    Intervention Provide a combined exercise and nutrition program that is supplemented with education, support and counseling about heart failure. Directed toward relieving symptoms such as shortness of breath, decreased exercise tolerance, and extremity edema.    Expected Outcomes Improve functional capacity of life;Short term: Attendance in program 2-3 days a week with increased exercise capacity. Reported lower sodium intake. Reported increased fruit and vegetable intake. Reports medication compliance.;Short term: Daily weights obtained and reported for increase. Utilizing diuretic protocols set by physician.;Long term: Adoption of self-care skills and reduction of barriers for early signs and symptoms recognition and intervention leading to self-care  maintenance.           Core Components/Risk Factors/Patient Goals Review:    Core Components/Risk Factors/Patient Goals at Discharge (Final Review):    ITP Comments:   Comments:

## 2019-08-29 ENCOUNTER — Encounter (HOSPITAL_COMMUNITY): Payer: Medicare Other

## 2019-08-31 ENCOUNTER — Other Ambulatory Visit: Payer: Self-pay

## 2019-08-31 ENCOUNTER — Encounter (HOSPITAL_COMMUNITY)
Admission: RE | Admit: 2019-08-31 | Discharge: 2019-08-31 | Disposition: A | Discharge status: Home / Self Care | Payer: Medicare Other | Source: Ambulatory Visit | Attending: Internal Medicine | Admitting: Internal Medicine

## 2019-08-31 DIAGNOSIS — I5032 Chronic diastolic (congestive) heart failure: Secondary | ICD-10-CM | POA: Diagnosis not present

## 2019-08-31 LAB — GLUCOSE, CAPILLARY: Glucose-Capillary: 253 mg/dL — ABNORMAL HIGH (ref 70–99)

## 2019-08-31 NOTE — Progress Notes (Signed)
Daily Session Note  Patient Details  Name: [name]. MRN: [id number] Date of Birth: 10-24-1940 Referring Provider:     Pulmonary Rehab Walk Test from 08/22/2019 in [name]  Referring Provider [name]      Encounter Date: 08/31/2019  Check In:  Session Check In - 08/31/19 1440      Check-In   Supervising physician immediately available to respond to emergencies [facility] immediately available    Physician(s) Dr. Gala Lewandowsky    Staff Present Maurice Small, RN, Bjorn Loser, MS, CEP, Exercise Physiologist;Kinley Ferrentino Ysidro Evert, RN;Jessica Hassell Done, MS, ACSM-CEP, Exercise Physiologist    Virtual Visit No    Medication changes reported     No    Fall or balance concerns reported    No    Tobacco Cessation No Change    Warm-up and Cool-down Performed on first and last piece of equipment    Resistance Training Performed Yes    VAD Patient? No    PAD/SET Patient? No      Pain Assessment   Currently in Pain? No/denies    Multiple Pain Sites No           Capillary Blood Glucose: No results found for this or any previous visit (from the past 24 hour(s)).    Social History   Tobacco Use  Smoking Status Former Smoker  . Packs/day: 4.00  . Years: 25.00  . Pack years: 100.00  . Types: Cigarettes  . Quit date: 01/05/1981  . Years since quitting: 38.6  Smokeless Tobacco Never Used    Goals Met:  Exercise tolerated well No report of cardiac concerns or symptoms Strength training completed today  Goals Unmet:  Not Applicable  Comments: Service time is from 1005 to 32    Dr. Fransico Him is Medical Director for Cardiac Rehab at [facility].

## 2019-09-01 ENCOUNTER — Encounter (INDEPENDENT_AMBULATORY_CARE_PROVIDER_SITE_OTHER): Payer: Medicare Other | Admitting: Ophthalmology

## 2019-09-01 DIAGNOSIS — I10 Essential (primary) hypertension: Secondary | ICD-10-CM | POA: Diagnosis not present

## 2019-09-01 DIAGNOSIS — H4311 Vitreous hemorrhage, right eye: Secondary | ICD-10-CM

## 2019-09-01 DIAGNOSIS — H34832 Tributary (branch) retinal vein occlusion, left eye, with macular edema: Secondary | ICD-10-CM

## 2019-09-01 DIAGNOSIS — H35033 Hypertensive retinopathy, bilateral: Secondary | ICD-10-CM | POA: Diagnosis not present

## 2019-09-01 DIAGNOSIS — H338 Other retinal detachments: Secondary | ICD-10-CM

## 2019-09-01 DIAGNOSIS — H43813 Vitreous degeneration, bilateral: Secondary | ICD-10-CM

## 2019-09-05 ENCOUNTER — Other Ambulatory Visit: Payer: Self-pay

## 2019-09-05 ENCOUNTER — Encounter: Payer: Self-pay | Admitting: Adult Health Nurse Practitioner

## 2019-09-05 ENCOUNTER — Ambulatory Visit (INDEPENDENT_AMBULATORY_CARE_PROVIDER_SITE_OTHER): Payer: Medicare Other | Admitting: Adult Health Nurse Practitioner

## 2019-09-05 ENCOUNTER — Encounter (HOSPITAL_COMMUNITY)
Admission: RE | Admit: 2019-09-05 | Discharge: 2019-09-05 | Disposition: A | Discharge status: Home / Self Care | Payer: Medicare Other | Source: Ambulatory Visit | Attending: Internal Medicine | Admitting: Internal Medicine

## 2019-09-05 VITALS — BP 110/64 | HR 62 | Temp 97.7°F | Wt 185.0 lb

## 2019-09-05 DIAGNOSIS — D6869 Other thrombophilia: Secondary | ICD-10-CM

## 2019-09-05 DIAGNOSIS — I5032 Chronic diastolic (congestive) heart failure: Secondary | ICD-10-CM

## 2019-09-05 DIAGNOSIS — N184 Chronic kidney disease, stage 4 (severe): Secondary | ICD-10-CM | POA: Diagnosis not present

## 2019-09-05 DIAGNOSIS — I480 Paroxysmal atrial fibrillation: Secondary | ICD-10-CM

## 2019-09-05 DIAGNOSIS — Z794 Long term (current) use of insulin: Secondary | ICD-10-CM

## 2019-09-05 DIAGNOSIS — N183 Chronic kidney disease, stage 3 unspecified: Secondary | ICD-10-CM

## 2019-09-05 DIAGNOSIS — E1122 Type 2 diabetes mellitus with diabetic chronic kidney disease: Secondary | ICD-10-CM | POA: Diagnosis not present

## 2019-09-05 NOTE — Progress Notes (Signed)
[name]. 79 y.o. male Nutrition Note  Visit Diagnosis: Heart failure, diastolic, chronic (HCC)  Chronic diastolic (congestive) heart failure [facility])   Past Medical History:  Diagnosis Date  . Adrenal adenoma   . Anemia   . Anxiety   . Arthritis   . Atypical atrial flutter ([place]) 8/15, 10/15   a. DCCV 08/2013. b. s/p RFA 10/2013.  [name] Basal cell carcinoma   . CAD (coronary artery disease)    a. 04/2013 CABG x 2: LIMA to LAD, SVG to RI, EVH via R thigh.  . Cellulitis 12/2015   left leg  . Chronic diastolic congestive heart failure ([place])   . CKD (chronic kidney disease), stage III   . Depression   . Diabetes mellitus type II   . Diverticulosis 2001  . DJD (degenerative joint disease)   . GERD (gastroesophageal reflux disease)   . Gout   . H/O hiatal hernia   . History of cardioversion    x3 (years uncertain)  . Hx of adenomatous colonic polyps   . Hyperlipidemia   . Hypertension   . Hypertensive cardiomyopathy ([place])   . Hypertensive retinopathy    OU  . Macular degeneration    OU  . Obstructive sleep apnea    compliant with CPAP  . 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 Persistent atrial fibrillation ([place])    a. s/p MAZE 04/2013 in setting of CABG. b. Amio stopped in 10/2013 after flutter ablation.  [name] PFO (patent foramen ovale)    a. Small PFO by TEE 10/2013.  [name] Pleural effusion, left    a. s/p thoracentesis 05/2013.  [name] Respiratory failure ([place])    a. Hypoxia 10/2013 - required supp O2 as inpatient, did not require it at discharge.  . S/P Maze operation for atrial fibrillation    a. 04/2013: Complete bilateral atrial lesion set using cryothermy and bipolar radiofrequency ablation with clipping of LA appendage (@ time of CABG)  . S/P Maze operation for atrial fibrillation 04/05/2013   Complete bilateral atrial lesion set using cryothermy and bipolar  radiofrequency ablation with clipping of LA appendage via median sternotomy approach      Medications reviewed.   Current Outpatient Medications:  .  acetaminophen (TYLENOL) 500 MG tablet, Take 1,000 mg by mouth every 6 (six) hours as needed for moderate pain or headache. , Disp: , Rfl:  .  allopurinol (ZYLOPRIM) 300 MG tablet, Take 150 mg by mouth daily., Disp: , Rfl:  .  ALPRAZolam (XANAX) 1 MG tablet, Take 0.5 tablets (0.5 mg total) by mouth at bedtime as needed for sleep., Disp: 60 tablet, Rfl: 0 .  Alum Hydroxide-Mag Carbonate (GAVISCON PO), Take 4 tablets by mouth daily as needed (acid reflux). , Disp: , Rfl:  .  aspirin EC 81 MG tablet, Take 81 mg by mouth daily., Disp: , Rfl:  .  atorvastatin (LIPITOR) 20 MG tablet, Take 1 tablet 3 x /week (Patient taking differently: Take 20 mg by mouth 3 (three) times a week. Take 1 tablet 3 x /week), Disp: 36 tablet, Rfl: 3 .  azelastine (ASTELIN) 0.1 % nasal spray, Place 2 sprays into both nostrils 2 (two) times daily as needed for rhinitis or allergies., Disp: , Rfl:  .  B Complex Vitamins (VITAMIN B COMPLEX PO), Take 1 tablet by mouth at bedtime. , Disp: , Rfl:  .  bacitracin 500 UNIT/GM ointment, Apply  1 application topically daily as needed for wound care., Disp: , Rfl:  .  Blood Glucose Monitoring Suppl (ACCU-CHEK AVIVA PLUS) w/Device KIT, Check blood sugar 1 time  daily, Disp: 1 kit, Rfl: 0 .  Blood Glucose Monitoring Suppl DEVI, Test blood sugar up to three times a day or as directed., Disp: 1 each, Rfl: 0 .  carvedilol (COREG) 6.25 MG tablet, Take 0.5 tablets (3.125 mg total) by mouth in the morning AND 1 tablet (6.25 mg total) every evening., Disp: 270 tablet, Rfl: 1 .  Cholecalciferol (VITAMIN D PO), Take 5,000 Units by mouth daily. , Disp: , Rfl:  .  Continuous Blood Gluc Receiver (FREESTYLE LIBRE 14 DAY READER) DEVI, 1 Device by Does not apply route 3 (three) times daily. Dx: E11.22, N18.4, Z79.4, Disp: 1 each, Rfl: 0 .  Continuous Blood  Gluc Sensor (FREESTYLE LIBRE 14 DAY SENSOR) MISC, Check blood sugar 3 times a day. Dx: E11.22, N18.4, Z79.4, Disp: 2 each, Rfl: 12 .  diphenhydrAMINE (BENADRYL) 25 MG tablet, Take 25 mg by mouth at bedtime as needed for allergies. , Disp: , Rfl:  .  ferrous sulfate 325 (65 FE) MG EC tablet, Take 325 mg by mouth 3 (three) times a week. Take with vitamin C , Disp: , Rfl:  .  fluticasone (FLONASE) 50 MCG/ACT nasal spray, Place 1 spray into both nostrils daily as needed for allergies., Disp: 16 g, Rfl: 3 .  glucose blood (ACCU-CHEK AVIVA PLUS) test strip, CHECK BLOOD GLUCOSE 3 TIMES DAILY., Disp: 300 strip, Rfl: 3 .  insulin NPH-regular Human (70-30) 100 UNIT/ML injection, Inject 25-45 Units into the skin See admin instructions. 45 units in the morning and 25 units in the evening , Disp: , Rfl:  .  lidocaine (LIDODERM) 5 %, Place 1 patch onto the skin daily as needed (for pain). Remove & Discard patch within 12 hours or as directed by MD , Disp: , Rfl:  .  loratadine (CLARITIN) 10 MG tablet, Take 10 mg by mouth daily as needed for allergies. , Disp: , Rfl:  .  metolazone (ZAROXOLYN) 2.5 MG tablet, TAKE 1 TABLET BY MOUTH  DAILY AS NEEDED FOR  SWELLING (Patient taking differently: Take 2.5 mg by mouth daily as needed (swelling). ), Disp: 60 tablet, Rfl: 0 .  Multiple Vitamins-Minerals (PRESERVISION AREDS 2) CAPS, Take 1 capsule by mouth 2 (two) times daily. , Disp: , Rfl:  .  polyvinyl alcohol (ARTIFICIAL TEARS) 1.4 % ophthalmic solution, Place 1 drop into both eyes daily as needed for dry eyes., Disp: , Rfl:  .  potassium chloride SA (KLOR-CON) 20 MEQ tablet, Take 3 tablets (60 mEq total) by mouth in the morning AND 2 tablets (40 mEq total) daily after lunch AND 2 tablets (40 mEq total) every evening., Disp: 540 tablet, Rfl: 3 .  sertraline (ZOLOFT) 100 MG tablet, Take 1 tablet Daily for Mood (Patient taking differently: Take 100 mg by mouth daily. Take 1 tablet Daily for Mood), Disp: 90 tablet, Rfl: 3 .   torsemide (DEMADEX) 100 MG tablet, Take 150 mg by mouth daily., Disp: , Rfl:  .  warfarin (COUMADIN) 5 MG tablet, Take 5-7.5 mg by mouth See admin instructions. 1.5 tablets 4 days a week. Alternating with 1 tablets on MWF (total 9 tabs/week) , Disp: , Rfl:    Ht Readings from Last 1 Encounters:  07/06/19 5' 11"  (1.803 m)     Wt Readings from Last 3 Encounters:  08/22/19 184 lb 8.4 oz (83.7 kg)  08/03/19 182 lb (82.6 kg)  07/12/19 186 lb (84.4 kg)     There is no height or weight on file to calculate BMI.   Social History   Tobacco Use  Smoking Status Former Smoker  . Packs/day: 4.00  . Years: 25.00  . Pack years: 100.00  . Types: Cigarettes  . Quit date: 01/05/1981  . Years since quitting: 38.6  Smokeless Tobacco Never Used     Lab Results  Component Value Date   CHOL 163 03/28/2019   Lab Results  Component Value Date   HDL 31 (L) 03/28/2019   Lab Results  Component Value Date   LDLCALC 95 03/28/2019   Lab Results  Component Value Date   TRIG 257 (H) 03/28/2019     Lab Results  Component Value Date   HGBA1C 7.4 (H) 03/28/2019     CBG (last 3)  No results for input(s): GLUCAP in the last 72 hours.   Nutrition Note  Spoke with pt. Nutrition Plan and Nutrition Survey goals reviewed with pt.   Pt has Type 2 Diabetes. Pt checks CBG's 1 times a day. Normal fasting CBGs 100-120 mg/dl. Recently his CBGs were elevated in morning and during the day. His insulin had been decreased after his iron infusion (per pt report) and he has increased by 3 units every few days and is now taking 35 units at night (vs prescribed 25 units). This week fasting CBGs have been trending down. Fasting CBGs 150-180 mg/dl. Today his CBG pre exercise was 170 mg/dl vs last week  253 mg/dl mg/dl.   Pt with dx of CHF. Per discussion, pt does use canned/convenience foods often. Pt does add salt to food. Pt does eat out frequently. Discussed taking 1/2 meal home to decrease sodium intake.  He  takes daily weights.   Pt expressed understanding of the information reviewed.   Nutrition Diagnosis ? Excessive carbohydrate intake related to food preferences as evidenced by A1C 7.4 and pt report    Nutrition Intervention ? Pt's individual nutrition plan reviewed with pt. ? Benefits of adopting Heart Healthy diet discussed when Medficts reviewed.   ? Continue client-centered nutrition education by RD, as part of interdisciplinary care.  Goal(s) ? Pt to identify and limit food sources of saturated fat, trans fat, refined carbohydrates and sodium ? CBG concentrations in the normal range or as close to normal as is safely possible. ? Improved blood glucose control as evidenced by pt's A1c trending from 7.4 toward less than 7.0. Plan:   Will provide client-centered nutrition education as part of interdisciplinary care  Monitor and evaluate progress toward nutrition goal with team.   Michaele Offer, MS, RDN, LDN

## 2019-09-05 NOTE — Progress Notes (Signed)
Assessment and Plan:  PAF (paroxysmal atrial fibrillation) (HCC) Check INR and will adjust medication according to labs in 4 weeks pending results  Discussed if patient falls to immediately contact office or go to ER. Discussed foods that can increase or decrease Coumadin levels.   Acquired thrombophilia (HCC) No S&S of bleeding   Chronic diastolic congestive heart failure (HCC) Encouraged daily monitoring of the patient's weight Continue follow up with cardiology Taking torsemide 100mg and potassium 20meq Coreg 6.25mg, half tablet BID  Senile purpura (HCC) Discussed process, protect skin, sunscreen   Type 2 diabetes mellitus with stage 4 chronic kidney disease, with long-term current use of insulin (HCC) Monitor- check with cardiology- continue to monitor kidney function   CKD stage 3 due to type 2 diabetes mellitus (HCC) Increase fluids  Avoid NSAIDS Blood pressure control Monitor sugars  Will continue to monitor   Medication management Continued    Further disposition pending results of labs. Discussed med's effects and SE's.   Over 20 minutes of face to face exam, counseling, chart review, and critical decision making was performed.   Future Appointments  Date Time Provider Department Center  09/07/2019  1:15 PM [facility] None  09/12/2019  1:15 PM [facility] None  09/13/2019  9:45 AM [facility] None  09/13/2019 10:20 AM Kale, Gautam Kishore, MD [facility] None  09/14/2019  1:15 PM [facility] None  09/19/2019  1:15 PM [facility] None  09/21/2019  1:15 PM [facility]GR[facility] None  09/26/2019  1:15 PM [facility]GR[facility] None  09/28/2019  1:15 PM [facility]GR[facility] None  10/03/2019  1:15 PM [facility] None  10/05/2019  1:15 PM [facility]GR[facility] None   10/10/2019  1:15 PM [facility]GRAD [facility] None  10/11/2019 10:00 AM McKeown, William, MD [facility] None  10/12/2019  1:15 PM [facility]GRAD [facility] None  10/17/2019  1:15 PM [facility]ERGR[facility] None  10/19/2019  1:15 PM [facility]GR[facility] None  10/24/2019  1:15 PM [facility] REH[facility] UNDERGRAD [facility] None  10/26/2019  1:15 PM [facility] REH[facility] UNDERGRAD [facility] None  01/19/2020 12:30 PM Matthews, John D, MD [facility] None    ------------------------------------------------------------------------------------------------------------------   HPI BP 110/64   Pulse 62   Temp 97.7 F (36.5 C)   Wt 185 lb (83.9 kg)   SpO2 96%   BMI 25.80 kg/m   79 y.o.male presents for follow up on coumadin for p. A. fib, CHF, HTN, CKD III/IV.  He is currently going to out patient Pulmonary Rehabilitation is going well.  He reports he wishes they made it a little it harder.  He is going two days a week for pulmonary and 3 days a week for cardiac rehabilitation.   Patient had recent RHC with Dr. Bensihmon. Cardiology is treating carvedilol 6.25 mg - takes 1/2 tab BID due to hypotension.  Wt Readings from Last 5 Encounters:  09/05/19 185 lb (83.9 kg)  08/22/19 184 lb 8.4 oz (83.7 kg)  08/03/19 182 lb (82.6 kg)  07/12/19 186 lb (84.4 kg)  07/06/19 187 lb (84.8 kg)   Lab Results  Component Value Date   CREATININE 1.74 (H) 08/03/2019   BUN 30 (H) 08/03/2019   NA 141 08/03/2019   K 3.7 08/03/2019   CL 101 08/03/2019   CO2 29 08/03/2019   He has seen Dr. Kale for anemia,  on iron infusion- states his   dizziness has improved- last episode of dizziness was after his last infusion.  He is also wearing compression stockings.   Patient is on Coumadin for PAF (paroxysmal atrial fibrillation) ([place]) [I48.0] Patient's last INR is  Lab Results  Component Value Date   INR 1.2 (H) 08/03/2019   INR 2.1 (H) 07/06/2019   INR 2.8 (H)  07/03/2019    Patient denies CP,  and blood in stool/urine.  His coumadin dose was changed last visit due to acute vitreous hemorrhage of his right eye, his new goal is 2-2.5.  Current dose: changed to 1.5 tabs 4 days a week, alternating with 1 tab on 3 days a week.   He has not taken ABX, he has not missed doses of coumadin this past tuesdayHas not missed any doses of coumadin. Denies falls.  Lab Results  Component Value Date   HGB 11.9 (L) 08/03/2019   HGB 9.4 (L) 07/12/2019   HGB 8.8 (L) 07/06/2019    Had remained on iron supplement which he reported caused constipation but has been managing.  No night sweats, no rash. Has had normal phosphorus at nephrology.  Lab Results  Component Value Date   IRON 28 (L) 07/05/2019   TIBC 435 (H) 07/05/2019   FERRITIN 89 07/05/2019   Lab Results  Component Value Date   GFRNONAA 93 (L) 08/03/2019   Lab Results  Component Value Date   CALCIUM 8.6 08/03/2019   Phosphorous has been normal  Past Medical History:  Diagnosis Date  . Adrenal adenoma   . Anemia   . Anxiety   . Arthritis   . Atypical atrial flutter ([place]) 8/15, 10/15   a. DCCV 08/2013. b. s/p RFA 10/2013.  [name] Basal cell carcinoma   . CAD (coronary artery disease)    a. 04/2013 CABG x 2: LIMA to LAD, SVG to RI, EVH via R thigh.  . Cellulitis 12/2015   left leg  . Chronic diastolic congestive heart failure ([place])   . CKD (chronic kidney disease), stage III   . Depression   . Diabetes mellitus type II   . Diverticulosis 2001  . DJD (degenerative joint disease)   . GERD (gastroesophageal reflux disease)   . Gout   . H/O hiatal hernia   . History of cardioversion    x3 (years uncertain)  . Hx of adenomatous colonic polyps   . Hyperlipidemia   . Hypertension   . Hypertensive cardiomyopathy ([place])   . Hypertensive retinopathy    OU  . Macular degeneration    OU  . Obstructive sleep apnea    compliant with CPAP  . Partial anomalous pulmonary venous return with intact  interatrial septum 05/10/2014   Right superior pulmonary vein drains into superior vena cava  . Persistent atrial fibrillation ([place])    a. s/p MAZE 04/2013 in setting of CABG. b. Amio stopped in 10/2013 after flutter ablation.  [name] PFO (patent foramen ovale)    a. Small PFO by TEE 10/2013.  [name] Pleural effusion, left    a. s/p thoracentesis 05/2013.  [name] Respiratory failure ([place])    a. Hypoxia 10/2013 - required supp O2 as inpatient, did not require it at discharge.  . S/P Maze operation for atrial fibrillation    a. 04/2013: Complete bilateral atrial lesion set using cryothermy and bipolar radiofrequency ablation with clipping of LA appendage (@ time of CABG)  . S/P Maze operation for atrial fibrillation 04/05/2013   Complete bilateral atrial lesion set using cryothermy and bipolar radiofrequency  ablation with clipping of LA appendage via median sternotomy approach      Allergies  Allergen Reactions  . Sunflower Seed [Sunflower Oil] Swelling and Other (See Comments)    Tongue and lip swelling  . Horse-Derived Products Other (See Comments)    Per allergy skin test UNSPECIFIED REACTION   . Tetanus Toxoids Other (See Comments)    Per allergy skin test  . Tetanus Toxoid     Other reaction(s): Other (See Comments) Rash(horse serum)     Current Outpatient Medications (Endocrine & Metabolic):  .  insulin NPH-regular Human (70-30) 100 UNIT/ML injection, Inject 25-45 Units into the skin See admin instructions. 45 units in the morning and 30 units in the evening  Current Outpatient Medications (Cardiovascular):  .  atorvastatin (LIPITOR) 20 MG tablet, Take 1 tablet 3 x /week (Patient taking differently: Take 20 mg by mouth 3 (three) times a week. Take 1 tablet 3 x /week) .  carvedilol (COREG) 6.25 MG tablet, Take 0.5 tablets (3.125 mg total) by mouth in the morning AND 1 tablet (6.25 mg total) every evening. .  metolazone (ZAROXOLYN) 2.5 MG tablet, TAKE 1 TABLET BY MOUTH  DAILY AS NEEDED FOR  SWELLING  (Patient taking differently: Take 2.5 mg by mouth daily as needed (swelling). ) .  torsemide (DEMADEX) 100 MG tablet, Take 150 mg by mouth daily.  Current Outpatient Medications (Respiratory):  .  azelastine (ASTELIN) 0.1 % nasal spray, Place 2 sprays into both nostrils 2 (two) times daily as needed for rhinitis or allergies. .  diphenhydrAMINE (BENADRYL) 25 MG tablet, Take 25 mg by mouth at bedtime as needed for allergies.  .  fluticasone (FLONASE) 50 MCG/ACT nasal spray, Place 1 spray into both nostrils daily as needed for allergies. .  loratadine (CLARITIN) 10 MG tablet, Take 10 mg by mouth daily as needed for allergies.   Current Outpatient Medications (Analgesics):  .  acetaminophen (TYLENOL) 500 MG tablet, Take 1,000 mg by mouth every 6 (six) hours as needed for moderate pain or headache.  .  allopurinol (ZYLOPRIM) 300 MG tablet, Take 150 mg by mouth daily. .  aspirin EC 81 MG tablet, Take 81 mg by mouth daily.  Current Outpatient Medications (Hematological):  .  ferrous sulfate 325 (65 FE) MG EC tablet, Take 325 mg by mouth 3 (three) times a week. Take with vitamin C  .  warfarin (COUMADIN) 5 MG tablet, Take 5-7.5 mg by mouth See admin instructions. 1.5 tablets 4 days a week. Alternating with 1 tablets on MWF (total 9 tabs/week)   Current Outpatient Medications (Other):  .  ALPRAZolam (XANAX) 1 MG tablet, Take 0.5 tablets (0.5 mg total) by mouth at bedtime as needed for sleep. .  Alum Hydroxide-Mag Carbonate (GAVISCON PO), Take 4 tablets by mouth daily as needed (acid reflux).  .  B Complex Vitamins (VITAMIN B COMPLEX PO), Take 1 tablet by mouth at bedtime.  .  bacitracin 500 UNIT/GM ointment, Apply 1 application topically daily as needed for wound care. .  Blood Glucose Monitoring Suppl (ACCU-CHEK AVIVA PLUS) w/Device KIT, Check blood sugar 1 time  daily .  Blood Glucose Monitoring Suppl DEVI, Test blood sugar up to three times a day or as directed. .  Cholecalciferol (VITAMIN D PO),  Take 5,000 Units by mouth daily.  .  glucose blood (ACCU-CHEK AVIVA PLUS) test strip, CHECK BLOOD GLUCOSE 3 TIMES DAILY. .  lidocaine (LIDODERM) 5 %, Place 1 patch onto the skin daily as needed (for pain). Remove &   Discard patch within 12 hours or as directed by MD  .  Multiple Vitamins-Minerals (PRESERVISION AREDS 2) CAPS, Take 1 capsule by mouth 2 (two) times daily.  .  polyvinyl alcohol (ARTIFICIAL TEARS) 1.4 % ophthalmic solution, Place 1 drop into both eyes daily as needed for dry eyes. .  potassium chloride SA (KLOR-CON) 20 MEQ tablet, Take 3 tablets (60 mEq total) by mouth in the morning AND 2 tablets (40 mEq total) daily after lunch AND 2 tablets (40 mEq total) every evening. .  sertraline (ZOLOFT) 100 MG tablet, Take 1 tablet Daily for Mood (Patient taking differently: Take 100 mg by mouth daily. Take 1 tablet Daily for Mood) .  Continuous Blood Gluc Receiver (FREESTYLE LIBRE 14 DAY READER) DEVI, 1 Device by Does not apply route 3 (three) times daily. Dx: E11.22, N18.4, Z79.4 (Patient not taking: Reported on 09/05/2019) .  Continuous Blood Gluc Sensor (FREESTYLE LIBRE 14 DAY SENSOR) MISC, Check blood sugar 3 times a day. Dx: E11.22, N18.4, Z79.4 (Patient not taking: Reported on 09/05/2019)  ROS: ROS   Physical Exam:  BP 110/64   Pulse 62   Temp 97.7 F (36.5 C)   Wt 185 lb (83.9 kg)   SpO2 96%   BMI 25.80 kg/m   General Appearance: Well nourished, in no apparent distress. Eyes: PERRLA, EOMs, conjunctiva no swelling or erythema Sinuses: No Frontal/maxillary tenderness ENT/Mouth: Ext aud canals clear, TMs without erythema, bulging. No erythema, swelling, or exudate on post pharynx.  Tonsils not swollen or erythematous. Hearing normal.  Neck: Supple, thyroid normal.  Respiratory: Respiratory effort normal, BS equal bilaterally without rales, rhonchi, wheezing or stridor.  Cardio: RRR with no MRGs, systolic murmur. Brisk peripheral pulses with trace bilateral edema.  Abdomen: Soft,  obese/mildly distended, + BS.  Non tender, no guarding, rebound, hernias, masses. Lymphatics: Non tender without lymphadenopathy.   Musculoskeletal: Full ROM, 5/5 strength, normal gait.  Skin: Warm, dry without rashes, lesions; he has fragile skin and numerous small ecchymoses to bilateral upper extremities.  Neuro: Cranial nerves intact. Normal muscle tone, no cerebellar symptoms. Sensation intact.  Psych: Awake and oriented X 3, normal affect, Insight and Judgment appropriate.    Kyra McClanahan, NP 4:10 PM [facility]  

## 2019-09-05 NOTE — Progress Notes (Signed)
Daily Session Note  Patient Details  Name: [name]. MRN: [id number] Date of Birth: 1940-05-13 Referring Provider:     Pulmonary Rehab Walk Test from 08/22/2019 in [place]  Referring Provider [name]      Encounter Date: 09/05/2019  Check In:  Session Check In - 09/05/19 1341      Check-In   Supervising physician immediately available to respond to emergencies [facility] immediately available    Physician(s) Dr. Gala Lewandowsky    Location [facility]    Staff Present Maurice Small, RN, Bjorn Loser, MS, CEP, Exercise Physiologist;Emiyah Spraggins Jani Gravel, MS, ACSM-CEP, Exercise Physiologist    Virtual Visit No    Medication changes reported     No    Fall or balance concerns reported    No    Tobacco Cessation No Change    Warm-up and Cool-down Performed on first and last piece of equipment    Resistance Training Performed Yes    VAD Patient? No    PAD/SET Patient? No      Pain Assessment   Currently in Pain? No/denies    Multiple Pain Sites No           Capillary Blood Glucose: No results found for this or any previous visit (from the past 24 hour(s)).    Social History   Tobacco Use  Smoking Status Former Smoker  . Packs/day: 4.00  . Years: 25.00  . Pack years: 100.00  . Types: Cigarettes  . Quit date: 01/05/1981  . Years since quitting: 38.6  Smokeless Tobacco Never Used    Goals Met:  Exercise tolerated well No report of cardiac concerns or symptoms Strength training completed today  Goals Unmet:  Not Applicable  Comments: Service time is from 1305 to 1430    Dr. Fransico Him is Medical Director for Cardiac Rehab at [facility].

## 2019-09-06 LAB — CBC WITH DIFFERENTIAL/PLATELET
Absolute Monocytes: 698 cells/uL (ref 200–950)
Basophils Absolute: 56 cells/uL (ref 0–200)
Basophils Relative: 0.6 %
Eosinophils Absolute: 130 cells/uL (ref 15–500)
Eosinophils Relative: 1.4 %
HCT: 39.3 % (ref 38.5–50.0)
Hemoglobin: 12.6 g/dL — ABNORMAL LOW (ref 13.2–17.1)
Lymphs Abs: 949 cells/uL (ref 850–3900)
MCH: 29.6 pg (ref 27.0–33.0)
MCHC: 32.1 g/dL (ref 32.0–36.0)
MCV: 92.5 fL (ref 80.0–100.0)
MPV: 9.2 fL (ref 7.5–12.5)
Monocytes Relative: 7.5 %
Neutro Abs: 7468 cells/uL (ref 1500–7800)
Neutrophils Relative %: 80.3 %
Platelets: 165 10*3/uL (ref 140–400)
RBC: 4.25 10*6/uL (ref 4.20–5.80)
RDW: 18.7 % — ABNORMAL HIGH (ref 11.0–15.0)
Total Lymphocyte: 10.2 %
WBC: 9.3 10*3/uL (ref 3.8–10.8)

## 2019-09-06 LAB — PROTIME-INR
INR: 1.5 — ABNORMAL HIGH
Prothrombin Time: 15.3 s — ABNORMAL HIGH (ref 9.0–11.5)

## 2019-09-07 ENCOUNTER — Other Ambulatory Visit: Payer: Self-pay

## 2019-09-07 ENCOUNTER — Encounter (HOSPITAL_COMMUNITY)
Admission: RE | Admit: 2019-09-07 | Discharge: 2019-09-07 | Disposition: A | Discharge status: Home / Self Care | Payer: Medicare Other | Source: Ambulatory Visit | Attending: Internal Medicine | Admitting: Internal Medicine

## 2019-09-07 DIAGNOSIS — I5032 Chronic diastolic (congestive) heart failure: Secondary | ICD-10-CM | POA: Diagnosis not present

## 2019-09-07 NOTE — Progress Notes (Signed)
I have reviewed a Home Exercise Prescription with [name]. [name] [name] is currently exercising at home.  The patient was advised to walk 2 days a week for 30-45 minutes.  [name] and I discussed how to progress their exercise prescription.  The patient stated that their goals were to live a few more years.  The patient stated that they understand the exercise prescription.  We reviewed exercise guidelines, target heart rate during exercise, RPE Scale, weather conditions, NTG use, endpoints for exercise, warmup and cool down.  Patient is encouraged to come to me with any questions. I will continue to follow up with the patient to assist them with progression and safety.

## 2019-09-07 NOTE — Progress Notes (Signed)
Daily Session Note  Patient Details  Name: Cory O Pall Jr. MRN: [id number] Date of Birth: 06/29/1940 Referring Provider:     Pulmonary Rehab Walk Test from 08/22/2019 in [facility]  Referring Provider Dr. Bensimhon      Encounter Date: 09/07/2019  Check In:  Session Check In - 09/07/19 1534      Check-In   Supervising physician immediately available to respond to emergencies [facility] immediately available    Physician(s) Dr. Arrien    Location [facility]    Staff Present Dalton Fletcher, MS, CEP, Exercise Physiologist;Lisa Hughes, RN;David Makemson, MS, EP-C, CCRP    Virtual Visit No    Medication changes reported     No    Fall or balance concerns reported    No    Tobacco Cessation No Change    Warm-up and Cool-down Performed on first and last piece of equipment    Resistance Training Performed Yes    VAD Patient? No    PAD/SET Patient? No      Pain Assessment   Currently in Pain? No/denies    Multiple Pain Sites No           Capillary Blood Glucose: No results found for this or any previous visit (from the past 24 hour(s)).   Exercise Prescription Changes - 09/07/19 1400      Home Exercise Plan   Plans to continue exercise at Home (comment)    Frequency Add 2 additional days to program exercise sessions.    Initial Home Exercises Provided 09/07/19           Social History   Tobacco Use  Smoking Status Former Smoker  . Packs/day: 4.00  . Years: 25.00  . Pack years: 100.00  . Types: Cigarettes  . Quit date: 01/05/1981  . Years since quitting: 38.6  Smokeless Tobacco Never Used    Goals Met:  Exercise tolerated well No report of cardiac concerns or symptoms Strength training completed today  Goals Unmet:  Not Applicable  Comments: Service time is from 1315 to 1425    Dr. Traci Turner is Medical Director for Cardiac Rehab at  Hospital. 

## 2019-09-12 ENCOUNTER — Encounter (HOSPITAL_COMMUNITY)
Admission: RE | Admit: 2019-09-12 | Discharge: 2019-09-12 | Disposition: A | Discharge status: Home / Self Care | Payer: Medicare Other | Source: Ambulatory Visit | Attending: Internal Medicine | Admitting: Internal Medicine

## 2019-09-12 ENCOUNTER — Other Ambulatory Visit: Payer: Self-pay

## 2019-09-12 VITALS — Wt 183.0 lb

## 2019-09-12 DIAGNOSIS — I5032 Chronic diastolic (congestive) heart failure: Secondary | ICD-10-CM | POA: Diagnosis not present

## 2019-09-12 NOTE — Progress Notes (Signed)
Daily Session Note  Patient Details  Name: [name]. MRN: [id number] Date of Birth: [birth year] Referring Provider:     Pulmonary Rehab Walk Test from 08/22/2019 in [place]  Referring Provider [name]      Encounter Date: 09/12/2019  Check In:  Session Check In - 09/12/19 1458      Check-In   Supervising physician immediately available to respond to emergencies [facility] immediately available    Physician(s) Dr. Cathlean Sauer    Location [facility]    Staff Present Rodney Langton, RN;David Whitney, MS, EP-C, CCRP;Jessica Hassell Done, MS, ACSM-CEP, Exercise Physiologist    Virtual Visit No    Medication changes reported     No    Fall or balance concerns reported    No    Tobacco Cessation No Change    Warm-up and Cool-down Performed on first and last piece of equipment    Resistance Training Performed Yes    VAD Patient? No    PAD/SET Patient? No      Pain Assessment   Currently in Pain? No/denies    Multiple Pain Sites No           Capillary Blood Glucose: No results found for this or any previous visit (from the past 24 hour(s)).  POCT Glucose - 09/12/19 1508      POCT Blood Glucose   Pre-Exercise 97 mg/dL    Post-Exercise 103 mg/dL           Exercise Prescription Changes - 09/12/19 1500      Response to Exercise   Blood Pressure (Admit) 104/60    Blood Pressure (Exercise) 130/60    Blood Pressure (Exit) 130/80    Heart Rate (Admit) 75 bpm    Heart Rate (Exercise) 81 bpm    Heart Rate (Exit) 77 bpm    Oxygen Saturation (Admit) 97 %    Oxygen Saturation (Exercise) 96 %    Oxygen Saturation (Exit) 95 %    Rating of Perceived Exertion (Exercise) 13    Perceived Dyspnea (Exercise) 1    Duration Continue with 30 min of aerobic exercise without signs/symptoms of physical distress.    Intensity Other (comment)   40-80% of HRR     Progression   Progression Continue to progress workloads to maintain  intensity without signs/symptoms of physical distress.      Resistance Training   Training Prescription Yes    Weight orange bands    Reps 10-15    Time 10 Minutes      NuStep   Level 2    SPM 80    Minutes 15    METs 2.2      Track   Laps 12    Minutes 15           Social History   Tobacco Use  Smoking Status Former Smoker  . Packs/day: 4.00  . Years: 25.00  . Pack years: 100.00  . Types: Cigarettes  . Quit date: 01/05/1981  . Years since quitting: 38.7  Smokeless Tobacco Never Used    Goals Met:  Exercise tolerated well No report of cardiac concerns or symptoms Strength training completed today  Goals Unmet:  Not Applicable  Comments: Service time is from 1315 to 1416    Dr. Fransico Him is Medical Director for Cardiac Rehab at [facility].

## 2019-09-13 ENCOUNTER — Other Ambulatory Visit: Payer: Self-pay | Admitting: Hematology

## 2019-09-13 ENCOUNTER — Telehealth: Payer: Self-pay | Admitting: Hematology

## 2019-09-13 ENCOUNTER — Inpatient Hospital Stay: Payer: Medicare Other | Attending: Hematology | Admitting: Hematology

## 2019-09-13 ENCOUNTER — Inpatient Hospital Stay: Payer: Medicare Other

## 2019-09-13 ENCOUNTER — Encounter (HOSPITAL_COMMUNITY): Payer: Self-pay

## 2019-09-13 ENCOUNTER — Other Ambulatory Visit: Payer: Self-pay

## 2019-09-13 VITALS — BP 126/80 | HR 66 | Temp 96.7°F | Resp 18 | Ht 71.0 in | Wt 183.4 lb

## 2019-09-13 DIAGNOSIS — N183 Chronic kidney disease, stage 3 unspecified: Secondary | ICD-10-CM | POA: Insufficient documentation

## 2019-09-13 DIAGNOSIS — D631 Anemia in chronic kidney disease: Secondary | ICD-10-CM | POA: Insufficient documentation

## 2019-09-13 DIAGNOSIS — D509 Iron deficiency anemia, unspecified: Secondary | ICD-10-CM

## 2019-09-13 DIAGNOSIS — D649 Anemia, unspecified: Secondary | ICD-10-CM | POA: Diagnosis not present

## 2019-09-13 LAB — CBC WITH DIFFERENTIAL/PLATELET
Abs Immature Granulocytes: 0.03 10*3/uL (ref 0.00–0.07)
Basophils Absolute: 0 10*3/uL (ref 0.0–0.1)
Basophils Relative: 1 %
Eosinophils Absolute: 0.2 10*3/uL (ref 0.0–0.5)
Eosinophils Relative: 2 %
HCT: 39.6 % (ref 39.0–52.0)
Hemoglobin: 12.5 g/dL — ABNORMAL LOW (ref 13.0–17.0)
Immature Granulocytes: 0 %
Lymphocytes Relative: 9 %
Lymphs Abs: 0.7 10*3/uL (ref 0.7–4.0)
MCH: 29.6 pg (ref 26.0–34.0)
MCHC: 31.6 g/dL (ref 30.0–36.0)
MCV: 93.8 fL (ref 80.0–100.0)
Monocytes Absolute: 0.7 10*3/uL (ref 0.1–1.0)
Monocytes Relative: 9 %
Neutro Abs: 6.4 10*3/uL (ref 1.7–7.7)
Neutrophils Relative %: 79 %
Platelets: 163 10*3/uL (ref 150–400)
RBC: 4.22 MIL/uL (ref 4.22–5.81)
RDW: 18.6 % — ABNORMAL HIGH (ref 11.5–15.5)
WBC: 8 10*3/uL (ref 4.0–10.5)
nRBC: 0 % (ref 0.0–0.2)

## 2019-09-13 LAB — CMP (CANCER CENTER ONLY)
ALT: 24 U/L (ref 0–44)
AST: 21 U/L (ref 15–41)
Albumin: 3.7 g/dL (ref 3.5–5.0)
Alkaline Phosphatase: 127 U/L — ABNORMAL HIGH (ref 38–126)
Anion gap: 8 (ref 5–15)
BUN: 37 mg/dL — ABNORMAL HIGH (ref 8–23)
CO2: 33 mmol/L — ABNORMAL HIGH (ref 22–32)
Calcium: 8.9 mg/dL (ref 8.9–10.3)
Chloride: 101 mmol/L (ref 98–111)
Creatinine: 1.84 mg/dL — ABNORMAL HIGH (ref 0.61–1.24)
GFR, Est AFR Am: 39 mL/min — ABNORMAL LOW (ref >60)
GFR, Estimated: 34 mL/min — ABNORMAL LOW (ref >60)
Glucose, Bld: 172 mg/dL — ABNORMAL HIGH (ref 70–99)
Potassium: 3.3 mmol/L — ABNORMAL LOW (ref 3.5–5.1)
Sodium: 142 mmol/L (ref 135–145)
Total Bilirubin: 0.8 mg/dL (ref 0.3–1.2)
Total Protein: 7.1 g/dL (ref 6.5–8.1)

## 2019-09-13 LAB — RETICULOCYTES
Immature Retic Fract: 18.8 % — ABNORMAL HIGH (ref 2.3–15.9)
RBC.: 4.19 MIL/uL — ABNORMAL LOW (ref 4.22–5.81)
Retic Count, Absolute: 61.6 10*3/uL (ref 19.0–186.0)
Retic Ct Pct: 1.5 % (ref 0.4–3.1)

## 2019-09-13 LAB — FERRITIN: Ferritin: 173 ng/mL (ref 24–336)

## 2019-09-13 LAB — IRON AND TIBC
Iron: 80 ug/dL (ref 42–163)
Saturation Ratios: 29 % (ref 20–55)
TIBC: 273 ug/dL (ref 202–409)
UIBC: 193 ug/dL (ref 117–376)

## 2019-09-13 NOTE — Telephone Encounter (Signed)
Scheduled appointments per 9/8 los. Patient is aware of appointments, but declined calendar print out.

## 2019-09-13 NOTE — Progress Notes (Signed)
HEMATOLOGY/ONCOLOGY CONSULTATION NOTE  Date of Service: 09/13/2019  Patient Care Team: [name], MD as PCP - General (Internal Medicine) Inda Castle, MD (Inactive) as Consulting Physician (Gastroenterology) Martinique, Peter M, MD as Consulting Physician (Cardiology) Rexene Alberts, MD as Consulting Physician (Cardiothoracic Surgery) Szott, Prescott Gum, DDS as Referring Physician Hayden Pedro, MD as Consulting Physician (Ophthalmology) Lavonna Monarch, MD as Consulting Physician (Dermatology) Josue Hector, MD as Consulting Physician (Cardiology) Thompson Grayer, MD as Consulting Physician (Cardiology) Rosita Fire, MD as Consulting Physician (Nephrology) Bensimhon, Shaune Pascal, MD as Consulting Physician (Cardiology)  CHIEF COMPLAINTS/PURPOSE OF CONSULTATION:  Chronic anemia  HISTORY OF PRESENTING ILLNESS:   [name]. is a wonderful 79 y.o. male who has been referred to [place] by Cory Comber, NP for evaluation and management of chronic anemia. The pt reports that he is doing well overall.   The pt reports that he has known that he was anemic for awhile. He has been using 325 mg Ferrous Sulfate which was causing abdominal discomfort and constipation when taken daily and only showed minimal anemia improvement. Pt is now taking Ferrous Sulfate three times per week. He has never required IV Iron.   Pt has been on Coumadin for his Afib and on Aspirin for years and has noticed increased bruising. A few weeks ago the vision in his right eye began to blur and he was thought to have a vitreous hemorrhage. It has since resolved. They are currently lowering his Coumadin dosage, as it was a risk factor for the hemorrhage.   Pt is following with [name] for Cardiology and has a right-sided heart catheterization scheduled for tomorrow. Scheduling of the right heart cath began after pt began to experience increased shortness of breath. He has a history of  pulmonary hypertension.   He denies any recent gout flares.   Of note prior to the patient's visit today, pt has had Upper GI Endoscopy completed on  01/26/2017 with results revealing  "- Normal esophagus. 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 Injected. Treated with argon plasma coagulation (APC). Clip (MR conditional) was placed. - Normal examined duodenum. - No specimens collected."  Pt has had Colonoscopy completed on 01/26/2017 with results revealing  "- Decreased sphincter tone found on digital rectal exam. - Diverticulosis in the left colon. - One 15 mm polyp at the appendiceal orifice. Biopsied. - Internal hemorrhoids. - The examination was otherwise normal on direct and retroflexion views."  Most recent lab results (07/03/2019) of CBC & CMP is as follows: all values are WNL except for RBC at 3.74, Hgb at 9.5, HCT at 33.3, MCH at 25.4, MCHC at 28.5, RDW at 18.5, Potassium at 3.1, Chloride at 94, Glucose at 225, BUN at 35, Creatinine at 2.05, GFR Est Non Af Am at 30.  On review of systems, pt reports SOB, bruising, muscle cramps, dizziness and denies unexpected weight loss, bone pain and any other symptoms.   On PMHx the pt reports Atypical atrial flutter, BCC, CAD, Chronic diastolic congestive heart failure, CKD, DM Type II, Gout. HLD, HTN, Appendectomy, Cardiac Catheterization, CABG. 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    INTERVAL HISTORY: [name]. is a wonderful 79 y.o. male who is here for evaluation and management of chronic anemia. The patient's last visit with [place] was on 07/05/2019. The pt reports that he is doing well overall.  The pt reports that he had no  issues tolerating the IV Iron. He continues to be on Aspirin and Coumadin and denies any blood or black stools. Pt notes that the vision changes in his right eye have mostly resolved after his vitreous humor hemorrhage. He denies any significant leg swelling and wears compression  socks.  Lab results today (09/13/19) of CBC w/diff and CMP is as follows: all values are WNL except for Hgb at 12.5, RDW at 18.6, Potassium at 3.3, CO2 at 33, Glucose at 172, BUN at 37, Creatinine at 1.84, ALP at 127, GFR Est Non Af Am at 34. 09/13/2019 Ferritin at 173 09/13/2019 Iron Panel is as follows: all values are WNL 09/13/2019 Reticulocytes is as follows: Retic Ct Pct at 1.5, RBC at 4.19, Retic Ct Abs at 61.6, Immature Retic Fract at 18.8  On review of systems, pt denies fatigue, bloody stools, melena, leg swelling and any other symptoms.   MEDICAL HISTORY:  Past Medical History:  Diagnosis Date  . Adrenal adenoma   . Anemia   . Anxiety   . Arthritis   . Atypical atrial flutter ([place]) 8/15, 10/15   a. DCCV 08/2013. b. s/p RFA 10/2013.  [name] Basal cell carcinoma   . CAD (coronary artery disease)    a. 04/2013 CABG x 2: LIMA to LAD, SVG to RI, EVH via R thigh.  . Cellulitis 12/2015   left leg  . Chronic diastolic congestive heart failure ([place])   . CKD (chronic kidney disease), stage III   . Depression   . Diabetes mellitus type II   . Diverticulosis 2001  . DJD (degenerative joint disease)   . GERD (gastroesophageal reflux disease)   . Gout   . H/O hiatal hernia   . History of cardioversion    x3 (years uncertain)  . Hx of adenomatous colonic polyps   . Hyperlipidemia   . Hypertension   . Hypertensive cardiomyopathy ([place])   . Hypertensive retinopathy    OU  . Macular degeneration    OU  . Obstructive sleep apnea    compliant with CPAP  . 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 Persistent atrial fibrillation ([place])    a. s/p MAZE 04/2013 in setting of CABG. b. Amio stopped in 10/2013 after flutter ablation.  [name] PFO (patent foramen ovale)    a. Small PFO by TEE 10/2013.  [name] Pleural effusion, left    a. s/p thoracentesis 05/2013.  [name] Respiratory failure ([place])    a.  Hypoxia 10/2013 - required supp O2 as inpatient, did not require it at discharge.  . S/P Maze operation for atrial fibrillation    a. 04/2013: Complete bilateral atrial lesion set using cryothermy and bipolar radiofrequency ablation with clipping of LA appendage (@ time of CABG)  . S/P Maze operation for atrial fibrillation 04/05/2013   Complete bilateral atrial lesion set using cryothermy and bipolar radiofrequency ablation with clipping of LA appendage via median sternotomy approach     SURGICAL HISTORY: Past Surgical History:  Procedure Laterality Date  . APPENDECTOMY  03/05/2017   laproscopic  . ATRIAL FIBRILLATION ABLATION N/A 10/26/2013   Procedure: ATRIAL FIBRILLATION ABLATION;  Surgeon: Coralyn Mark, MD;  Location: [facility];  Service: Cardiovascular;  Laterality: N/A;  . BASAL CELL CARCINOMA EXCISION     x3 on face  . CARDIAC CATHETERIZATION     myocardial bridge but no cad  . CARDIOVERSION N/A 08/23/2013  Procedure: CARDIOVERSION;  Surgeon: Sanda Klein, MD;  Location: [place] [facility];  Service: Cardiovascular;  Laterality: N/A;  . CARPOMETACARPEL SUSPENSION PLASTY Left 02/14/2014   Procedure: CARPOMETACARPEL ([place]) SUSPENSIONPLASTY THUMB  WITH  ABDUCTOR POLLICIS LONGUS TRANSFER AND STENOSING TENOSYNOVITIS RELEASE LEFT WRIST;  Surgeon: Charlotte Crumb, MD;  Location: [facility];  Service: Orthopedics;  Laterality: Left;  . CATARACT EXTRACTION Bilateral   . COLONOSCOPY WITH PROPOFOL N/A 01/26/2017   Procedure: COLONOSCOPY WITH PROPOFOL;  Surgeon: Doran Stabler, MD;  Location: [facility];  Service: Gastroenterology;  Laterality: N/A;  . CORONARY ARTERY BYPASS GRAFT N/A 04/05/2013   Procedure: CORONARY ARTERY BYPASS GRAFTING (CABG) TIMES TWO USING LEFT INTERNAL MAMMARY ARTERY AND RIGHT SAPHENOUS LEG VEIN HARVESTED ENDOSCOPICALLY;  Surgeon: Rexene Alberts, MD;  Location: [facility];  Service: Open Heart Surgery;  Laterality: N/A;  . ESOPHAGOGASTRODUODENOSCOPY (EGD) WITH  PROPOFOL N/A 01/26/2017   Procedure: ESOPHAGOGASTRODUODENOSCOPY (EGD) WITH PROPOFOL;  Surgeon: Doran Stabler, MD;  Location: [facility];  Service: Gastroenterology;  Laterality: N/A;  . EYE SURGERY Bilateral    Cat Sx  . EYE SURGERY Right    RD repair - SB  . GREAT TOE ARTHRODESIS, INTERPHALANGEAL JOINT     Right foot  . INTRAOPERATIVE TRANSESOPHAGEAL ECHOCARDIOGRAM N/A 04/05/2013   Procedure: INTRAOPERATIVE TRANSESOPHAGEAL ECHOCARDIOGRAM;  Surgeon: Rexene Alberts, MD;  Location: [facility];  Service: Open Heart Surgery;  Laterality: N/A;  . LAPAROSCOPIC APPENDECTOMY N/A 03/05/2017   Procedure: APPENDECTOMY LAPAROSCOPIC;  Surgeon: Judeth Horn, MD;  Location: [place];  Service: General;  Laterality: N/A;  . LEFT HEART CATHETERIZATION WITH CORONARY ANGIOGRAM N/A 03/07/2013   Procedure: LEFT HEART CATHETERIZATION WITH CORONARY ANGIOGRAM;  Surgeon: Burnell Blanks, MD;  Location[facility] C[facility] LAB;  Service: Cardiovascular;  Laterality: N/A;  . MAZE N/A 04/05/2013   Procedure: MAZE;  Surgeon: Rexene Alberts, MD;  Location: [place];  Service: Open Heart Surgery;  Laterality: N/A;  . Polinydal cyst     Removed  . POLYPECTOMY    . Retina repair-right    . RETINAL DETACHMENT SURGERY Right    RD Repair - SB  . RIGHT HEART CATH N/A 07/06/2019   Procedure: RIGHT HEART CATH;  Surgeon: Jolaine Artist, MD;  Location: [facility] LAB;  Service: Cardiovascular;  Laterality: N/A;  . RIGHT HEART CATHETERIZATION N/A 05/03/2014   Procedure: RIGHT HEART CATH;  Surgeon: Jolaine Artist, MD;  Location: [facility] CATH LAB;  Service: Cardiovascular;  Laterality: N/A;  . TEE WITHOUT CARDIOVERSION N/A 08/23/2013   Procedure: TRANSESOPHAGEAL ECHOCARDIOGRAM (TEE);  Surgeon: Sanda Klein, MD;  Location: [facility];  Service: Cardiovascular;  Laterality: N/A;  . TEE WITHOUT CARDIOVERSION N/A 10/26/2013   Procedure: TRANSESOPHAGEAL ECHOCARDIOGRAM (TEE);  Surgeon: Sueanne Margarita, MD;  Location: [facility] ENDOSCOPY;  Service:  Cardiovascular;  Laterality: N/A;  . TEE WITHOUT CARDIOVERSION N/A 06/05/2014   Procedure: TRANSESOPHAGEAL ECHOCARDIOGRAM (TEE);  Surgeon: Thayer Headings, MD;  Location: [place];  Service: Cardiovascular;  Laterality: N/A;  . TRAPEZIUM RESECTION      SOCIAL HISTORY: Social History   Socioeconomic History  . Marital status: Married    Spouse name: Not on file  . Number of children: 1  . Years of education: Not on file  . Highest education level: Not on file  Occupational History  . Occupation: retired Software engineer  Tobacco Use  . Smoking status: Former Smoker    Packs/day: 4.00    Years: 25.00    Pack years: 100.00  Types: Cigarettes    Quit date: 01/05/1981    Years since quitting: 38.7  . Smokeless tobacco: Never Used  Vaping Use  . Vaping Use: Never used  Substance and Sexual Activity  . Alcohol use: Yes    Alcohol/week: 1.0 standard drink    Types: 1 Shots of liquor per week    Comment: 1-5 drinks per week  . Drug use: No  . Sexual activity: Not on file  Other Topics Concern  . Not on file  Social History Narrative   Daily caffeine-yes   Patient gets regular exercise.   Pt lives in [place] with spouse.  Retired Software engineer.  Social Determinants of Health   Financial Resource Strain:   . Difficulty of Paying Living Expenses: Not on file  Food Insecurity:   . Worried About Charity fundraiser in the Last Year: Not on file  . Ran Out of Food in the Last Year: Not on file  Transportation Needs:   . Lack of Transportation (Medical): Not on file  . Lack of Transportation (Non-Medical): Not on file  Physical Activity:   . Days of Exercise per Week: Not on file  . Minutes of Exercise per Session: Not on file  Stress:   . Feeling of Stress : Not on file  Social Connections:   . Frequency of Communication with Friends and Family: Not on file  . Frequency of Social Gatherings with Friends and Family: Not on file  . Attends Religious Services: Not on file  . Active Member of Clubs or Organizations: Not on file  . Attends Archivist Meetings: Not on file  . Marital Status: Not on file  Intimate Partner Violence:   . Fear of Current or Ex-Partner: Not on file  . Emotionally Abused: Not on file  . Physically Abused: Not on file  . Sexually Abused: Not on file    FAMILY HISTORY: Family History  Problem Relation Age of Onset  . Dementia Father   . Colon cancer Mother        Family History/Uncle   . Colon polyps Mother        Family History  . Atrial fibrillation Mother   . Hypertension Mother   . Colon polyps Sister        Family history  . Diabetes Maternal Uncle   . Stroke Paternal Uncle     ALLERGIES:  is allergic to sunflower seed [sunflower oil], horse-derived products, tetanus toxoids, and tetanus toxoid.  MEDICATIONS:  Current Outpatient Medications   Medication Sig Dispense Refill  . acetaminophen (TYLENOL) 500 MG tablet Take 1,000 mg by mouth every 6 (six) hours as needed for moderate pain or headache.     . allopurinol (ZYLOPRIM) 300 MG tablet Take 150 mg by mouth daily.    [name] ALPRAZolam (XANAX) 1 MG tablet Take 0.5 tablets (0.5 mg total) by mouth at bedtime as needed for sleep. 60 tablet 0  . Alum Hydroxide-Mag Carbonate (GAVISCON PO) Take 4 tablets by mouth daily as needed (acid reflux).     [name] aspirin EC 81 MG tablet Take 81 mg by mouth daily.    [name] atorvastatin (LIPITOR) 20 MG tablet Take 1 tablet 3 x /week (Patient taking differently: Take 20 mg by mouth 3 (three) times a week. Take 1 tablet 3 x /week) 36 tablet 3  . azelastine (ASTELIN) 0.1 % nasal spray Place 2 sprays into both nostrils 2 (two) times daily as needed for rhinitis or allergies.    . B Complex Vitamins (VITAMIN B COMPLEX PO) Take 1 tablet by mouth at bedtime.     . bacitracin 500 UNIT/GM ointment Apply 1 application topically daily as needed for wound care.    . Blood Glucose Monitoring Suppl (ACCU-CHEK AVIVA PLUS) w/Device KIT Check blood sugar 1 time  daily 1 kit 0  . Blood Glucose Monitoring Suppl DEVI Test blood sugar up to three times a day or as directed. 1 each 0  . carvedilol (COREG) 6.25 MG tablet Take 0.5 tablets (3.125 mg total) by mouth in the morning AND 1 tablet (6.25 mg total) every evening. 270 tablet 1  . Cholecalciferol (VITAMIN  D PO) Take 5,000 Units by mouth daily.     . Continuous Blood Gluc Receiver (FREESTYLE LIBRE 14 DAY READER) DEVI 1 Device by Does not apply route 3 (three) times daily. Dx: E11.22, N18.4, Z79.4 (Patient not taking: Reported on 09/05/2019) 1 each 0  . Continuous Blood Gluc Sensor (FREESTYLE LIBRE 14 DAY SENSOR) MISC Check blood sugar 3 times a day. Dx: E11.22, N18.4, Z79.4 (Patient not taking: Reported on 09/05/2019) 2 each 12  . diphenhydrAMINE (BENADRYL) 25 MG tablet Take 25 mg by mouth at bedtime as needed for allergies.     .  ferrous sulfate 325 (65 FE) MG EC tablet Take 325 mg by mouth 3 (three) times a week. Take with vitamin C     . fluticasone (FLONASE) 50 MCG/ACT nasal spray Place 1 spray into both nostrils daily as needed for allergies. 16 g 3  . glucose blood (ACCU-CHEK AVIVA PLUS) test strip CHECK BLOOD GLUCOSE 3 TIMES DAILY. 300 strip 3  . insulin NPH-regular Human (70-30) 100 UNIT/ML injection Inject 25-45 Units into the skin See admin instructions. 45 units in the morning and 30 units in the evening    . lidocaine (LIDODERM) 5 % Place 1 patch onto the skin daily as needed (for pain). Remove & Discard patch within 12 hours or as directed by MD     . loratadine (CLARITIN) 10 MG tablet Take 10 mg by mouth daily as needed for allergies.     . metolazone (ZAROXOLYN) 2.5 MG tablet TAKE 1 TABLET BY MOUTH  DAILY AS NEEDED FOR  SWELLING (Patient taking differently: Take 2.5 mg by mouth daily as needed (swelling). ) 60 tablet 0  . Multiple Vitamins-Minerals (PRESERVISION AREDS 2) CAPS Take 1 capsule by mouth 2 (two) times daily.     . polyvinyl alcohol (ARTIFICIAL TEARS) 1.4 % ophthalmic solution Place 1 drop into both eyes daily as needed for dry eyes.    . potassium chloride SA (KLOR-CON) 20 MEQ tablet Take 3 tablets (60 mEq total) by mouth in the morning AND 2 tablets (40 mEq total) daily after lunch AND 2 tablets (40 mEq total) every evening. 540 tablet 3  . sertraline (ZOLOFT) 100 MG tablet Take 1 tablet Daily for Mood (Patient taking differently: Take 100 mg by mouth daily. Take 1 tablet Daily for Mood) 90 tablet 3  . torsemide (DEMADEX) 100 MG tablet Take 150 mg by mouth daily.    [name] warfarin (COUMADIN) 5 MG tablet Take 5-7.5 mg by mouth See admin instructions. 1.5 tablets 4 days a week. Alternating with 1 tablets on MWF (total 9 tabs/week)      No current facility-administered medications for this visit.    REVIEW OF SYSTEMS:   A 10+ POINT REVIEW OF SYSTEMS WAS OBTAINED including neurology, dermatology,  psychiatry, cardiac, respiratory, lymph, extremities, GI, GU, Musculoskeletal, constitutional, breasts, reproductive, HEENT.  All pertinent positives are noted in the HPI.  All others are negative.   PHYSICAL EXAMINATION: ECOG PERFORMANCE STATUS: 1 - Symptomatic but completely ambulatory  . Vitals:   09/13/19 1030  BP: 126/80  Pulse: 66  Resp: 18  Temp: (!) 96.7 F (35.9 C)  SpO2: 95%   Filed Weights   09/13/19 1030  Weight: 183 lb 6.4 oz (83.2 kg)   .Body mass index is 25.58 kg/m.  Exam was given in a chair   GENERAL:alert, in no acute distress and comfortable SKIN: no acute rashes, no significant lesions EYES: conjunctiva are pink and non-injected, sclera anicteric OROPHARYNX: MMM,  no exudates, no oropharyngeal erythema or ulceration NECK: supple, no JVD LYMPH:  no palpable lymphadenopathy in the cervical, axillary or inguinal regions LUNGS: clear to auscultation b/l with normal respiratory effort HEART: regular rate & rhythm ABDOMEN:  normoactive bowel sounds , non tender, not distended. No palpable hepatosplenomegaly.  Extremity: no pedal edema PSYCH: alert & oriented x 3 with fluent speech NEURO: no focal motor/sensory deficits  LABORATORY DATA:  I have reviewed the data as listed  . CBC Latest Ref Rng & Units 09/13/2019 09/05/2019 08/03/2019  WBC 4.0 - 10.5 K/uL 8.0 9.3 6.5  Hemoglobin 13.0 - 17.0 g/dL 12.5(L) 12.6(L) 11.9(L)  Hematocrit 39 - 52 % 39.6 39.3 38.7  Platelets 150 - 400 K/uL 163 165 205   . CBC    Component Value Date/Time   WBC 8.0 09/13/2019 0950   RBC 4.22 09/13/2019 0950   RBC 4.19 (L) 09/13/2019 0950   HGB 12.5 (L) 09/13/2019 0950   HCT 39.6 09/13/2019 0950   HCT 30.3 (L) 07/05/2019 1416   PLT 163 09/13/2019 0950   MCV 93.8 09/13/2019 0950   MCH 29.6 09/13/2019 0950   MCHC 31.6 09/13/2019 0950   RDW 18.6 (H) 09/13/2019 0950   LYMPHSABS 0.7 09/13/2019 0950   MONOABS 0.7 09/13/2019 0950   EOSABS 0.2 09/13/2019 0950   BASOSABS 0.0  09/13/2019 0950     . CMP Latest Ref Rng & Units 09/13/2019 08/03/2019 07/17/2019  Glucose 70 - 99 mg/dL 172(H) 107(H) 221(H)  BUN 8 - 23 mg/dL 37(H) 30(H) 22  Creatinine 0.61 - 1.24 mg/dL 1.84(H) 1.74(H) 1.70(H)  Sodium 135 - 145 mmol/L 142 141 141  Potassium 3.5 - 5.1 mmol/L 3.3(L) 3.7 3.1(L)  Chloride 98 - 111 mmol/L 101 101 99  CO2 22 - 32 mmol/L 33(H) 29 32  Calcium 8.9 - 10.3 mg/dL 8.9 8.6 8.5(L)  Total Protein 6.5 - 8.1 g/dL 7.1 7.1 -  Total Bilirubin 0.3 - 1.2 mg/dL 0.8 0.6 -  Alkaline Phos 38 - 126 U/L 127(H) - -  AST 15 - 41 U/L 21 23 -  ALT 0 - 44 U/L 24 21 -     RADIOGRAPHIC STUDIES: I have personally reviewed the radiological images as listed and agreed with the findings in the report.   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 -Will see back in 6 months with labs, sooner if any new concerns or returning anemia.    FOLLOW UP: RTC with Dr Irene Limbo with labs in 6 months    The total time spent in the appt was 20 minutes and more than 50% was on counseling and direct patient cares.  All of the patient's questions were answered with apparent satisfaction. The patient knows to call the clinic with any problems, questions or concerns.    Sullivan Lone MD [place] AAHIVMS [facility] Hematology/Oncology Physician [facility]  (Office):       [phone] (Work cell):  [phone] (Fax):  [phone]  09/13/2019 12:05 PM  I, Yevette Edwards, am acting as a scribe for Dr. Sullivan Lone.   .I have reviewed the above documentation for accuracy and completeness, and I agree with the above. Brunetta Genera MD

## 2019-09-14 ENCOUNTER — Encounter (HOSPITAL_COMMUNITY)
Admission: RE | Admit: 2019-09-14 | Discharge: 2019-09-14 | Disposition: A | Discharge status: Home / Self Care | Payer: Medicare Other | Source: Ambulatory Visit | Attending: Internal Medicine | Admitting: Internal Medicine

## 2019-09-14 DIAGNOSIS — N184 Chronic kidney disease, stage 4 (severe): Secondary | ICD-10-CM | POA: Diagnosis not present

## 2019-09-14 DIAGNOSIS — I5032 Chronic diastolic (congestive) heart failure: Secondary | ICD-10-CM | POA: Diagnosis not present

## 2019-09-14 NOTE — Progress Notes (Signed)
Daily Session Note  Patient Details  Name: Dvontae Ruan. MRN: [id number] Date of Birth: [birth year] Referring Provider:     Pulmonary Rehab Walk Test from 08/22/2019 in [employer]  Referring Provider [name]      Encounter Date: 09/14/2019  Check In:  Session Check In - 09/14/19 1350      Check-In   Supervising physician immediately available to respond to emergencies [facility] immediately available    Physician(s) Dr. Cathlean Sauer    Location [facility]    Staff Present Rodney Langton, RN;Jacier Gladu Hassell Done, MS, ACSM-CEP, Exercise Physiologist;Portia Rollene Rotunda, RN, BSN    Virtual Visit No    Medication changes reported     No    Fall or balance concerns reported    No    Tobacco Cessation No Change    Warm-up and Cool-down Performed on first and last piece of equipment    Resistance Training Performed Yes    VAD Patient? No    PAD/SET Patient? No      Pain Assessment   Currently in Pain? No/denies    Multiple Pain Sites No           Capillary Blood Glucose: No results found for this or any previous visit (from the past 24 hour(s)).    Social History   Tobacco Use  Smoking Status Former Smoker  . Packs/day: 4.00  . Years: 25.00  . Pack years: 100.00  . Types: Cigarettes  . Quit date: 01/05/1981  . Years since quitting: 38.7  Smokeless Tobacco Never Used    Goals Met:  Proper associated with RPD/PD & O2 Sat Exercise tolerated well No report of cardiac concerns or symptoms Strength training completed today  Goals Unmet:  Not Applicable  Comments: Service time is from 1315 to 1430    Dr. Fransico Him is Medical Director for Cardiac Rehab at [facility].

## 2019-09-14 NOTE — Progress Notes (Signed)
Pulmonary Individual Treatment Plan  Patient Details  Name: [name]. MRN: [id number] Date of Birth: [birth year] Referring Provider:     Pulmonary Rehab Walk Test from 08/22/2019 in [facility]  Referring Provider [name]      Initial Encounter Date:    Pulmonary Rehab Walk Test from 08/22/2019 in [facility]  Date 08/22/19      Visit Diagnosis: Chronic diastolic (congestive) heart failure (HCC)  Patient's Home Medications on Admission:   Current Outpatient Medications:  .  acetaminophen (TYLENOL) 500 MG tablet, Take 1,000 mg by mouth every 6 (six) hours as needed for moderate pain or headache. , Disp: , Rfl:  .  allopurinol (ZYLOPRIM) 300 MG tablet, Take 150 mg by mouth daily., Disp: , Rfl:  .  ALPRAZolam (XANAX) 1 MG tablet, Take 0.5 tablets (0.5 mg total) by mouth at bedtime as needed for sleep., Disp: 60 tablet, Rfl: 0 .  Alum Hydroxide-Mag Carbonate (GAVISCON PO), Take 4 tablets by mouth daily as needed (acid reflux). , Disp: , Rfl:  .  aspirin EC 81 MG tablet, Take 81 mg by mouth daily., Disp: , Rfl:  .  atorvastatin (LIPITOR) 20 MG tablet, Take 1 tablet 3 x /week (Patient taking differently: Take 20 mg by mouth 3 (three) times a week. Take 1 tablet 3 x /week), Disp: 36 tablet, Rfl: 3 .  azelastine (ASTELIN) 0.1 % nasal spray, Place 2 sprays into both nostrils 2 (two) times daily as needed for rhinitis or allergies., Disp: , Rfl:  .  B Complex Vitamins (VITAMIN B COMPLEX PO), Take 1 tablet by mouth at bedtime. , Disp: , Rfl:  .  bacitracin 500 UNIT/GM ointment, Apply 1 application topically daily as needed for wound care., Disp: , Rfl:  .  Blood Glucose Monitoring Suppl (ACCU-CHEK AVIVA PLUS) w/Device KIT, Check blood sugar 1 time  daily, Disp: 1 kit, Rfl: 0 .  Blood Glucose Monitoring Suppl DEVI, Test blood sugar up to three times a day or as directed., Disp: 1 each, Rfl: 0 .  carvedilol (COREG) 6.25 MG  tablet, Take 0.5 tablets (3.125 mg total) by mouth in the morning AND 1 tablet (6.25 mg total) every evening., Disp: 270 tablet, Rfl: 1 .  Cholecalciferol (VITAMIN D PO), Take 5,000 Units by mouth daily. , Disp: , Rfl:  .  Continuous Blood Gluc Receiver (FREESTYLE LIBRE 14 DAY READER) DEVI, 1 Device by Does not apply route 3 (three) times daily. Dx: E11.22, N18.4, Z79.4 (Patient not taking: Reported on 09/05/2019), Disp: 1 each, Rfl: 0 .  Continuous Blood Gluc Sensor (FREESTYLE LIBRE 14 DAY SENSOR) MISC, Check blood sugar 3 times a day. Dx: E11.22, N18.4, Z79.4 (Patient not taking: Reported on 09/05/2019), Disp: 2 each, Rfl: 12 .  diphenhydrAMINE (BENADRYL) 25 MG tablet, Take 25 mg by mouth at bedtime as needed for allergies. , Disp: , Rfl:  .  ferrous sulfate 325 (65 FE) MG EC tablet, Take 325 mg by mouth 3 (three) times a week. Take with vitamin C , Disp: , Rfl:  .  fluticasone (FLONASE) 50 MCG/ACT nasal spray, Place 1 spray into both nostrils daily as needed for allergies., Disp: 16 g, Rfl: 3 .  glucose blood (ACCU-CHEK AVIVA PLUS) test strip, CHECK BLOOD GLUCOSE 3 TIMES DAILY., Disp: 300 strip, Rfl: 3 .  insulin NPH-regular Human (70-30) 100 UNIT/ML injection, Inject 25-45 Units into the skin See admin instructions. 45 units in the morning and 30  units in the evening, Disp: , Rfl:  .  lidocaine (LIDODERM) 5 %, Place 1 patch onto the skin daily as needed (for pain). Remove & Discard patch within 12 hours or as directed by MD , Disp: , Rfl:  .  loratadine (CLARITIN) 10 MG tablet, Take 10 mg by mouth daily as needed for allergies. , Disp: , Rfl:  .  metolazone (ZAROXOLYN) 2.5 MG tablet, TAKE 1 TABLET BY MOUTH  DAILY AS NEEDED FOR  SWELLING (Patient taking differently: Take 2.5 mg by mouth daily as needed (swelling). ), Disp: 60 tablet, Rfl: 0 .  Multiple Vitamins-Minerals (PRESERVISION AREDS 2) CAPS, Take 1 capsule by mouth 2 (two) times daily. , Disp: , Rfl:  .  polyvinyl alcohol (ARTIFICIAL TEARS) 1.4  % ophthalmic solution, Place 1 drop into both eyes daily as needed for dry eyes., Disp: , Rfl:  .  potassium chloride SA (KLOR-CON) 20 MEQ tablet, Take 3 tablets (60 mEq total) by mouth in the morning AND 2 tablets (40 mEq total) daily after lunch AND 2 tablets (40 mEq total) every evening., Disp: 540 tablet, Rfl: 3 .  sertraline (ZOLOFT) 100 MG tablet, Take 1 tablet Daily for Mood (Patient taking differently: Take 100 mg by mouth daily. Take 1 tablet Daily for Mood), Disp: 90 tablet, Rfl: 3 .  torsemide (DEMADEX) 100 MG tablet, Take 150 mg by mouth daily., Disp: , Rfl:  .  warfarin (COUMADIN) 5 MG tablet, Take 5-7.5 mg by mouth See admin instructions. 1.5 tablets 4 days a week. Alternating with 1 tablets on MWF (total 9 tabs/week) , Disp: , Rfl:   Past Medical History: Past Medical History:  Diagnosis Date  . Adrenal adenoma   . Anemia   . Anxiety   . Arthritis   . Atypical atrial flutter ([place]) 8/15, 10/15   a. DCCV 08/2013. b. s/p RFA 10/2013.  [name] Basal cell carcinoma   . CAD (coronary artery disease)    a. 04/2013 CABG x 2: LIMA to LAD, SVG to RI, EVH via R thigh.  . Cellulitis 12/2015   left leg  . Chronic diastolic congestive heart failure ([place])   . CKD (chronic kidney disease), stage III   . Depression   . Diabetes mellitus type II   . Diverticulosis 2001  . DJD (degenerative joint disease)   . GERD (gastroesophageal reflux disease)   . Gout   . H/O hiatal hernia   . History of cardioversion    x3 (years uncertain)  . Hx of adenomatous colonic polyps   . Hyperlipidemia   . Hypertension   . Hypertensive cardiomyopathy ([place])   . Hypertensive retinopathy    OU  . Macular degeneration    OU  . Obstructive sleep apnea    compliant with CPAP  . 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 Persistent atrial fibrillation ([place])    a. s/p MAZE 04/2013 in setting of CABG. b. Amio  stopped in 10/2013 after flutter ablation.  [name] PFO (patent foramen ovale)    a. Small PFO by TEE 10/2013.  [name] Pleural effusion, left    a. s/p thoracentesis 05/2013.  [name] Respiratory failure ([place])    a. Hypoxia 10/2013 - required supp O2 as inpatient, did not require it at discharge.  . S/P Maze operation for atrial fibrillation    a. 04/2013: Complete bilateral atrial lesion set using cryothermy and bipolar radiofrequency ablation with  clipping of LA appendage (@ time of CABG)  . S/P Maze operation for atrial fibrillation 04/05/2013   Complete bilateral atrial lesion set using cryothermy and bipolar radiofrequency ablation with clipping of LA appendage via median sternotomy approach     Tobacco Use: Social History   Tobacco Use  Smoking Status Former Smoker  . Packs/day: 4.00  . Years: 25.00  . Pack years: 100.00  . Types: Cigarettes  . Quit date: 01/05/1981  . Years since quitting: 38.7  Smokeless Tobacco Never Used    Labs: Recent Chemical engineer    Labs for ITP Cardiac and Pulmonary Rehab Latest Ref Rng & Units 11/15/2018 03/28/2019 07/06/2019 07/06/2019 07/06/2019   Cholestrol <200 mg/dL 138 163 - - -   LDLCALC mg/dL (calc) 80 95 - - -   HDL > OR = 40 mg/dL 28(L) 31(L) - - -   Trlycerides <150 mg/dL 205(H) 257(H) - - -   Hemoglobin A1c <5.7 % of total Hgb 7.1(H) 7.4(H) - - -   PHART 7.35 - 7.45 - - - - -   PCO2ART 35 - 45 mmHg - - - - -   HCO3 20.0 - 28.0 mmol/L - - 30.4(H) 30.9(H) 30.2(H)   TCO2 22 - 32 mmol/L - - 31 32 31   ACIDBASEDEF 0.0 - 2.0 mmol/L - - - - -   O2SAT % - - 65.0 67.0 52.0      Capillary Blood Glucose: Lab Results  Component Value Date   GLUCAP 253 (H) 08/31/2019   GLUCAP 101 (H) 07/06/2019   GLUCAP 248 (H) 03/05/2017   GLUCAP 270 (H) 03/05/2017   GLUCAP 189 (H) 03/05/2017    POCT Glucose    Row Name 09/12/19 1508             POCT Blood Glucose   Pre-Exercise 97 mg/dL       Post-Exercise 103 mg/dL              Pulmonary Assessment  Scores:  Pulmonary Assessment Scores    Row Name 08/22/19 1538 08/22/19 1603       ADL UCSD   ADL Phase Entry Entry    SOB Score total 25 --      CAT Score   CAT Score 8 --      mMRC Score   mMRC Score -- 2          UCSD: Self-administered rating of dyspnea associated with activities of daily living (ADLs) 6-point scale (0 = "not at all" to 5 = "maximal or unable to do because of breathlessness")  Scoring Scores range from 0 to 120.  Minimally important difference is 5 units  CAT: CAT can identify the health impairment of COPD patients and is better correlated with disease progression.  CAT has a scoring range of zero to 40. The CAT score is classified into four groups of low (less than 10), medium (10 - 20), high (21-30) and very high (31-40) based on the impact level of disease on health status. A CAT score over 10 suggests significant symptoms.  A worsening CAT score could be explained by an exacerbation, poor medication adherence, poor inhaler technique, or progression of COPD or comorbid conditions.  CAT MCID is 2 points  mMRC: mMRC (Modified Medical Research Council) Dyspnea Scale is used to assess the degree of baseline functional disability in patients of respiratory disease due to dyspnea. No minimal important difference is established. A decrease in score of 1 point or greater  is considered a positive change.   Pulmonary Function Assessment:  Pulmonary Function Assessment - 08/22/19 1623      Breath   Bilateral Breath Sounds Rales   Rales throughout right lungand left with rales i/2 way up on the left side   Shortness of Breath Yes;Limiting activity           Exercise Target Goals: Exercise Program Goal: Individual exercise prescription set using results from initial 6 min walk test and THRR while considering  patient's activity barriers and safety.   Exercise Prescription Goal: Initial exercise prescription builds to 30-45 minutes a day of aerobic activity,  2-3 days per week.  Home exercise guidelines will be given to patient during program as part of exercise prescription that the participant will acknowledge.  Activity Barriers & Risk Stratification:  Activity Barriers & Cardiac Risk Stratification - 08/22/19 1455      Activity Barriers & Cardiac Risk Stratification   Activity Barriers Arthritis;Neck/Spine Problems;Joint Problems;Deconditioning;Muscular Weakness;Shortness of Breath;History of Falls    Cardiac Risk Stratification High           6 Minute Walk:  6 Minute Walk    Row Name 08/22/19 1605         6 Minute Walk   Phase Initial     Distance 1334 feet     Walk Time 6 minutes     # of Rest Breaks 0     MPH 2.53     METS 2.56     RPE 13     Perceived Dyspnea  1     VO2 Peak 8.96     Symptoms Yes (comment)  Right Hip and Knee hurt when walking due to arthritis     Resting HR 68 bpm     Resting BP 106/64     Resting Oxygen Saturation  98 %     Exercise Oxygen Saturation  during 6 min walk 92 %     Max Ex. HR 93 bpm     Max Ex. BP 136/60     2 Minute Post BP 136/64       Interval HR   1 Minute HR 92     2 Minute HR 90     3 Minute HR 93     4 Minute HR 84     5 Minute HR 87     6 Minute HR 89     2 Minute Post HR 67     Interval Heart Rate? Yes       Interval Oxygen   Interval Oxygen? Yes     Baseline Oxygen Saturation % 98 %     1 Minute Oxygen Saturation % 94 %     1 Minute Liters of Oxygen 0 L     2 Minute Oxygen Saturation % 94 %     2 Minute Liters of Oxygen 0 L     3 Minute Oxygen Saturation % 92 %     3 Minute Liters of Oxygen 0 L     4 Minute Oxygen Saturation % 92 %     4 Minute Liters of Oxygen 0 L     5 Minute Oxygen Saturation % 93 %     5 Minute Liters of Oxygen 0 L     6 Minute Oxygen Saturation % 92 %     6 Minute Liters of Oxygen 0 L     2 Minute Post Oxygen Saturation % 98 %  2 Minute Post Liters of Oxygen 0 L            Oxygen Initial Assessment:  Oxygen Initial Assessment -  08/22/19 1604      Home Oxygen   Home Oxygen Device None    Sleep Oxygen Prescription CPAP    Home Exercise Oxygen Prescription None    Home at Rest Exercise Oxygen Prescription None    Compliance with Home Oxygen Use Yes      Initial 6 min Walk   Oxygen Used None      Program Oxygen Prescription   Program Oxygen Prescription None      Intervention   Short Term Goals To learn and exhibit compliance with exercise, home and travel O2 prescription;To learn and understand importance of monitoring SPO2 with pulse oximeter and demonstrate accurate use of the pulse oximeter.;To learn and understand importance of maintaining oxygen saturations>88%;To learn and demonstrate proper pursed lip breathing techniques or other breathing techniques.;To learn and demonstrate proper use of respiratory medications    Long  Term Goals Exhibits compliance with exercise, home and travel O2 prescription;Verbalizes importance of monitoring SPO2 with pulse oximeter and return demonstration;Maintenance of O2 saturations>88%;Exhibits proper breathing techniques, such as pursed lip breathing or other method taught during program session;Compliance with respiratory medication;Demonstrates proper use of MDI's           Oxygen Re-Evaluation:   Oxygen Discharge (Final Oxygen Re-Evaluation):   Initial Exercise Prescription:  Initial Exercise Prescription - 08/23/19 1400      Date of Initial Exercise RX and Referring Provider   Date 08/22/19    Referring Provider Dr. Gala Romney    Expected Discharge Date 10/19/19      Recumbant Bike   Level 1    RPM 60    Minutes 15      NuStep   Level 2    SPM 80    Minutes 14      Prescription Details   Frequency (times per week) 2    Duration Progress to 30 minutes of continuous aerobic without signs/symptoms of physical distress      Intensity   THRR 40-80% of Max Heartrate 56-112    Ratings of Perceived Exertion 11-13    Perceived Dyspnea 0-4       Progression   Progression Continue to progress workloads to maintain intensity without signs/symptoms of physical distress.      Resistance Training   Training Prescription Yes    Weight orange bands    Reps 10-15           Perform Capillary Blood Glucose checks as needed.  Exercise Prescription Changes:  Exercise Prescription Changes    Row Name 09/07/19 1400 09/12/19 1500           Response to Exercise   Blood Pressure (Admit) -- 104/60      Blood Pressure (Exercise) -- 130/60      Blood Pressure (Exit) -- 130/80      Heart Rate (Admit) -- 75 bpm      Heart Rate (Exercise) -- 81 bpm      Heart Rate (Exit) -- 77 bpm      Oxygen Saturation (Admit) -- 97 %      Oxygen Saturation (Exercise) -- 96 %      Oxygen Saturation (Exit) -- 95 %      Rating of Perceived Exertion (Exercise) -- 13      Perceived Dyspnea (Exercise) -- 1      Duration --  Continue with 30 min of aerobic exercise without signs/symptoms of physical distress.      Intensity -- Other (comment)  40-80% of HRR        Progression   Progression -- Continue to progress workloads to maintain intensity without signs/symptoms of physical distress.        Resistance Training   Training Prescription -- Yes      Weight -- orange bands      Reps -- 10-15      Time -- 10 Minutes        NuStep   Level -- 2      SPM -- 80      Minutes -- 15      METs -- 2.2        Track   Laps -- 12      Minutes -- Joshua to continue exercise at Home (comment) --      Frequency Add 2 additional days to program exercise sessions. --      Initial Home Exercises Provided 09/07/19 --             Exercise Comments:  Exercise Comments    Row Name 08/31/19 1511 09/07/19 1457         Exercise Comments pt completed his first day of exercise with pulmonary rehab. He completed a 5 minute warm-up on the Nustep followed by 15 minutes with no complaints or concerns. He transitioned to the stationary  bike for another 15 minutes but complained of pain in both knees after 5 minutes. After today we will keep him on the Nustep for the whole 30 minutes of exercise. We will continue to monitor and progress as able. home exercise complete             Exercise Goals and Review:  Exercise Goals    Row Name 08/23/19 1433             Exercise Goals   Increase Physical Activity Yes       Intervention Provide advice, education, support and counseling about physical activity/exercise needs.;Develop an individualized exercise prescription for aerobic and resistive training based on initial evaluation findings, risk stratification, comorbidities and participant's personal goals.       Expected Outcomes Short Term: Attend rehab on a regular basis to increase amount of physical activity.;Long Term: Add in home exercise to make exercise part of routine and to increase amount of physical activity.;Long Term: Exercising regularly at least 3-5 days a week.       Increase Strength and Stamina Yes       Intervention Provide advice, education, support and counseling about physical activity/exercise needs.;Develop an individualized exercise prescription for aerobic and resistive training based on initial evaluation findings, risk stratification, comorbidities and participant's personal goals.       Expected Outcomes Short Term: Increase workloads from initial exercise prescription for resistance, speed, and METs.;Short Term: Perform resistance training exercises routinely during rehab and add in resistance training at home;Long Term: Improve cardiorespiratory fitness, muscular endurance and strength as measured by increased METs and functional capacity (6MWT)       Able to understand and use rate of perceived exertion (RPE) scale Yes       Intervention Provide education and explanation on how to use RPE scale       Expected Outcomes Short Term: Able to use RPE daily in rehab to express subjective intensity  level;Long  Term:  Able to use RPE to guide intensity level when exercising independently       Able to understand and use Dyspnea scale Yes       Intervention Provide education and explanation on how to use Dyspnea scale       Expected Outcomes Short Term: Able to use Dyspnea scale daily in rehab to express subjective sense of shortness of breath during exertion;Long Term: Able to use Dyspnea scale to guide intensity level when exercising independently       Knowledge and understanding of Target Heart Rate Range (THRR) Yes       Intervention Provide education and explanation of THRR including how the numbers were predicted and where they are located for reference       Expected Outcomes Short Term: Able to state/look up THRR;Long Term: Able to use THRR to govern intensity when exercising independently;Short Term: Able to use daily as guideline for intensity in rehab       Understanding of Exercise Prescription Yes       Intervention Provide education, explanation, and written materials on patient's individual exercise prescription       Expected Outcomes Short Term: Able to explain program exercise prescription;Long Term: Able to explain home exercise prescription to exercise independently              Exercise Goals Re-Evaluation :   Discharge Exercise Prescription (Final Exercise Prescription Changes):  Exercise Prescription Changes - 09/12/19 1500      Response to Exercise   Blood Pressure (Admit) 104/60    Blood Pressure (Exercise) 130/60    Blood Pressure (Exit) 130/80    Heart Rate (Admit) 75 bpm    Heart Rate (Exercise) 81 bpm    Heart Rate (Exit) 77 bpm    Oxygen Saturation (Admit) 97 %    Oxygen Saturation (Exercise) 96 %    Oxygen Saturation (Exit) 95 %    Rating of Perceived Exertion (Exercise) 13    Perceived Dyspnea (Exercise) 1    Duration Continue with 30 min of aerobic exercise without signs/symptoms of physical distress.    Intensity Other (comment)   40-80% of HRR      Progression   Progression Continue to progress workloads to maintain intensity without signs/symptoms of physical distress.      Resistance Training   Training Prescription Yes    Weight orange bands    Reps 10-15    Time 10 Minutes      NuStep   Level 2    SPM 80    Minutes 15    METs 2.2      Track   Laps 12    Minutes 15           Nutrition:  Target Goals: Understanding of nutrition guidelines, daily intake of sodium '1500mg'$ , cholesterol '200mg'$ , calories 30% from fat and 7% or less from saturated fats, daily to have 5 or more servings of fruits and vegetables.  Biometrics:  Pre Biometrics - 08/23/19 1435      Pre Biometrics   Grip Strength 37 kg            Nutrition Therapy Plan and Nutrition Goals:  Nutrition Therapy & Goals - 09/12/19 0746      Nutrition Therapy   Diet Low Sodium/Carb Modified    Drug/Food Interactions Coumadin/Vit K      Personal Nutrition Goals   Nutrition Goal Improved blood glucose control as evidenced by pt's A1c trending from 7.4 toward less  than 7.0.    Personal Goal #2 CBG concentrations in the normal range or as close to normal as is safely possible.    Personal Goal #3 Pt to identify and limit food sources of saturated fat, trans fat, refined carbohydrates and sodium      Intervention Plan   Intervention Prescribe, educate and counsel regarding individualized specific dietary modifications aiming towards targeted core components such as weight, hypertension, lipid management, diabetes, heart failure and other comorbidities.    Expected Outcomes Short Term Goal: A plan has been developed with personal nutrition goals set during dietitian appointment.           Nutrition Assessments:  Nutrition Assessments - 09/12/19 0748      Rate Your Plate Scores   Pre Score 62           Nutrition Goals Re-Evaluation:  Nutrition Goals Re-Evaluation    Row Name 09/12/19 0747             Goals   Current Weight 185 lb  (83.9 kg)       Nutrition Goal Improved blood glucose control as evidenced by pt's A1c trending from 7.4 toward less than 7.0.         Personal Goal #2 Re-Evaluation   Personal Goal #2 CBG concentrations in the normal range or as close to normal as is safely possible.         Personal Goal #3 Re-Evaluation   Personal Goal #3 Pt to identify and limit food sources of saturated fat, trans fat, refined carbohydrates and sodium              Nutrition Goals Discharge (Final Nutrition Goals Re-Evaluation):  Nutrition Goals Re-Evaluation - 09/12/19 0747      Goals   Current Weight 185 lb (83.9 kg)    Nutrition Goal Improved blood glucose control as evidenced by pt's A1c trending from 7.4 toward less than 7.0.      Personal Goal #2 Re-Evaluation   Personal Goal #2 CBG concentrations in the normal range or as close to normal as is safely possible.      Personal Goal #3 Re-Evaluation   Personal Goal #3 Pt to identify and limit food sources of saturated fat, trans fat, refined carbohydrates and sodium           Psychosocial: Target Goals: Acknowledge presence or absence of significant depression and/or stress, maximize coping skills, provide positive support system. Participant is able to verbalize types and ability to use techniques and skills needed for reducing stress and depression.  Initial Review & Psychosocial Screening:  Initial Psych Review & Screening - 08/22/19 1501      Initial Review   Current issues with Current Depression   depression managed well with Zoloft     Family Dynamics   Good Support System? Yes      Barriers   Psychosocial barriers to participate in program There are no identifiable barriers or psychosocial needs.      Screening Interventions   Interventions Encouraged to exercise    Expected Outcomes Short Term goal: Identification and review with participant of any Quality of Life or Depression concerns found by scoring the questionnaire.            Quality of Life Scores:  Scores of 19 and below usually indicate a poorer quality of life in these areas.  A difference of  2-3 points is a clinically meaningful difference.  A difference of 2-3 points in the total score  of the Quality of Life Index has been associated with significant improvement in overall quality of life, self-image, physical symptoms, and general health in studies assessing change in quality of life.  PHQ-9: Recent Review Flowsheet Data    Depression screen [facility] 2/9 08/22/2019 08/22/2019 03/27/2019 01/19/2019 12/18/2018   Decreased Interest 0 0 0 0 0   Down, Depressed, Hopeless 0 0 0 0 0   PHQ - 2 Score 0 0 0 0 0   Altered sleeping 0 - - - -   Tired, decreased energy 1  - - - -   Change in appetite 0 - - - -   Feeling bad or failure about yourself  0 - - - -   Trouble concentrating 0 - - - -   Moving slowly or fidgety/restless 0 - - - -   Suicidal thoughts 0 - - - -   PHQ-9 Score 1 - - - -   Difficult doing work/chores Not difficult at all - - - -     Interpretation of Total Score  Total Score Depression Severity:  1-4 = Minimal depression, 5-9 = Mild depression, 10-14 = Moderate depression, 15-19 = Moderately severe depression, 20-27 = Severe depression   Psychosocial Evaluation and Intervention:  Psychosocial Evaluation - 09/13/19 2252      Psychosocial Evaluation & Interventions   Interventions Stress management education;Relaxation education;Encouraged to exercise with the program and follow exercise prescription    Comments Ralston had history current depression related to his inability to particpate in activities of choice.    Expected Outcomes Nihal will display heatlhy coping skills and managment of his current depression.  he will also increase his physical activity as technique for managing his depression.           Psychosocial Re-Evaluation:  Psychosocial Re-Evaluation    [place] Name 09/13/19 2257 09/13/19 2258 09/13/19 2309         Psychosocial  Re-Evaluation   Current issues with Current Depression -- --     Comments Shaunak has a current history of depression and Jasper has a current history of depression and is not on any medications.  Feels he can mangage his blue days on his own Natanel has a current history of depression and is not on any medications.  Feels he can mangage his blue days on his own.  Added track as station of exercise which he enjoyed.     Expected Outcomes -- Nazaiah will continue to to display adequate coping skills for his current depression. Ras will seek further assistance if he feels he is unable to elevate himself out of his depression. --     Interventions -- Encouraged to attend Pulmonary Rehabilitation for the exercise;Relaxation education;Stress management education --     Continue Psychosocial Services  -- Follow up required by staff --            Psychosocial Discharge (Final Psychosocial Re-Evaluation):  Psychosocial Re-Evaluation - 09/13/19 2309      Psychosocial Re-Evaluation   Comments Lorris has a current history of depression and is not on any medications.  Feels he can mangage his blue days on his own.  Added track as station of exercise which he enjoyed.           Education: Education Goals: Education classes will be provided on a weekly basis, covering required topics. Participant will state understanding/return demonstration of topics presented.  Learning Barriers/Preferences:  Learning Barriers/Preferences - 08/22/19 1503      Learning Barriers/Preferences  Learning Barriers None    Learning Preferences Skilled Demonstration;Written Material           Education Topics: Risk Factor Reduction:  -Group instruction that is supported by a PowerPoint presentation. Instructor discusses the definition of a risk factor, different risk factors for pulmonary disease, and how the heart and lungs work together.     PULMONARY REHAB OTHER RESPIRATORY from 04/11/2015 in [place]  Date 03/14/15  Educator EP  Instruction Review Code (Retired) 2- meets goals/outcomes      Nutrition for Pulmonary Patient:  -Group instruction provided by [employer], verbal discussion, and written materials to support subject matter. The instructor gives an explanation and review of healthy diet recommendations, which includes a discussion on weight management, recommendations for fruit and vegetable consumption, as well as protein, fluid, caffeine, fiber, sodium, sugar, and alcohol. Tips for eating when patients are short of breath are discussed.   PULMONARY REHAB OTHER RESPIRATORY from 09/12/2019 in [place]  Date 08/31/19  Educator Handout      Pursed Lip Breathing:  -Group instruction that is supported by demonstration and informational handouts. Instructor discusses the benefits of pursed lip and diaphragmatic breathing and detailed demonstration on how to preform both.     Oxygen Safety:  -Group instruction provided by PowerPoint, verbal discussion, and written material to support subject matter. There is an overview of "What is Oxygen" and "Why do we need it".  Instructor also reviews how to create a safe environment for oxygen use, the importance of using oxygen as prescribed, and the risks of noncompliance. There is a brief discussion on traveling with oxygen and resources the patient may utilize.   PULMONARY REHAB OTHER RESPIRATORY from 04/11/2015 in [place]  Date 04/11/15  Educator Trish Fountain  Instruction Review Code (Retired) 2- meets goals/outcomes      Oxygen Equipment:  -Group instruction provided by [employer], Network engineer, and Insurance underwriter.   Signs and Symptoms:  -Group instruction provided by written material and verbal discussion to support subject matter. Warning signs and symptoms of infection, stroke, and heart attack  are reviewed and when to call the physician/911 reinforced. Tips for preventing the spread of infection discussed.   Advanced Directives:  -Group instruction provided by verbal instruction and written material to support subject matter. Instructor reviews Advanced Directive laws and proper instruction for filling out document.   Pulmonary Video:  -Group video education that reviews the importance of medication and oxygen compliance, exercise, good nutrition, pulmonary hygiene, and pursed lip and diaphragmatic breathing for the pulmonary patient.   Exercise for the Pulmonary Patient:  -Group instruction that is supported by a PowerPoint presentation. Instructor discusses benefits of exercise, core components of exercise, frequency, duration, and intensity of an exercise routine, importance of utilizing pulse oximetry during exercise, safety while exercising, and options of places to exercise outside of rehab.     Pulmonary Medications:  -Verbally interactive group education provided by instructor with focus on inhaled medications and proper administration.   PULMONARY REHAB OTHER RESPIRATORY from 04/11/2015 in [place]  Date 04/04/15  Educator Pharm D  Instruction Review Code (Retired) 2- Lawyer and Physiology of the Respiratory System and Intimacy:  -Group instruction provided by PowerPoint, verbal discussion, and written material to support subject matter. Instructor reviews respiratory cycle and anatomical components of the respiratory system  and their functions. Instructor also reviews differences in obstructive and restrictive respiratory diseases with examples of each. Intimacy, Sex, and Sexuality differences are reviewed with a discussion on how relationships can change when diagnosed with pulmonary disease. Common sexual concerns are reviewed.   MD DAY -A group question and answer session with a medical doctor that allows  participants to ask questions that relate to their pulmonary disease state.   OTHER EDUCATION -Group or individual verbal, written, or video instructions that support the educational goals of the pulmonary rehab program.   PULMONARY REHAB OTHER RESPIRATORY from 09/12/2019 in [place]  Date 09/12/19  Educator Handout  [METS]      Holiday Eating Survival Tips:  -Group instruction provided by PowerPoint slides, verbal discussion, and written materials to support subject matter. The instructor gives patients tips, tricks, and techniques to help them not only survive but enjoy the holidays despite the onslaught of food that accompanies the holidays.   Knowledge Questionnaire Score:  Knowledge Questionnaire Score - 08/22/19 1550      Knowledge Questionnaire Score   Pre Score 15/18           Core Components/Risk Factors/Patient Goals at Admission:  Personal Goals and Risk Factors at Admission - 08/22/19 1504      Core Components/Risk Factors/Patient Goals on Admission   Improve shortness of breath with ADL's Yes    Intervention Provide education, individualized exercise plan and daily activity instruction to help decrease symptoms of SOB with activities of daily living.    Expected Outcomes Short Term: Improve cardiorespiratory fitness to achieve a reduction of symptoms when performing ADLs;Long Term: Be able to perform more ADLs without symptoms or delay the onset of symptoms    Diabetes Yes    Intervention Provide education about signs/symptoms and action to take for hypo/hyperglycemia.;Provide education about proper nutrition, including hydration, and aerobic/resistive exercise prescription along with prescribed medications to achieve blood glucose in normal ranges: Fasting glucose 65-99 mg/dL    Expected Outcomes Long Term: Attainment of HbA1C < 7%.;Short Term: Participant verbalizes understanding of the signs/symptoms and immediate care of  hyper/hypoglycemia, proper foot care and importance of medication, aerobic/resistive exercise and nutrition plan for blood glucose control.    Heart Failure Yes    Intervention Provide a combined exercise and nutrition program that is supplemented with education, support and counseling about heart failure. Directed toward relieving symptoms such as shortness of breath, decreased exercise tolerance, and extremity edema.    Expected Outcomes Improve functional capacity of life;Short term: Attendance in program 2-3 days a week with increased exercise capacity. Reported lower sodium intake. Reported increased fruit and vegetable intake. Reports medication compliance.;Short term: Daily weights obtained and reported for increase. Utilizing diuretic protocols set by physician.;Long term: Adoption of self-care skills and reduction of barriers for early signs and symptoms recognition and intervention leading to self-care maintenance.           Core Components/Risk Factors/Patient Goals Review:   Goals and Risk Factor Review    Row Name 09/13/19 2300             Core Components/Risk Factors/Patient Goals Review   Personal Goals Review Improve shortness of breath with ADL's;Diabetes;Increase knowledge of respiratory medications and ability to use respiratory devices properly.;Heart Failure;Develop more efficient breathing techniques such as purse lipped breathing and diaphragmatic breathing and practicing self-pacing with activity.       Review [place]aka has completed 4 exercise sessions.  pt is able to recall  techniques he learned from previous participation. Pt is observed using these techniques independently.  Continue to monitor.  Daquann has adequate blood glucose pre and post exercise.  Alhaji CHF is managed by Heart failure clinic and has diuretic prn medication that he demonstrate when to use the prn medicaiton, weigs daily and avoids high sodium foods.       Expected Outcomes See Admission Goals               Core Components/Risk Factors/Patient Goals at Discharge (Final Review):   Goals and Risk Factor Review - 09/13/19 2300      Core Components/Risk Factors/Patient Goals Review   Personal Goals Review Improve shortness of breath with ADL's;Diabetes;Increase knowledge of respiratory medications and ability to use respiratory devices properly.;Heart Failure;Develop more efficient breathing techniques such as purse lipped breathing and diaphragmatic breathing and practicing self-pacing with activity.    Review cynthia has completed 4 exercise sessions.  pt is able to recall techniques he learned from previous participation. Pt is observed using these techniques independently.  Continue to monitor.  Bjorn has adequate blood glucose pre and post exercise.  Yahya CHF is managed by Heart failure clinic and has diuretic prn medication that he demonstrate when to use the prn medicaiton, weigs daily and avoids high sodium foods.    Expected Outcomes See Admission Goals           ITP Comments:   Comments:  Atlee has completed 4 exercise session in Pulmonary rehab. Pt maintains good attendance and consistent home exercise. Pulmonary rehab staff will  continue to monitor and reassess progress toward goals during her participation in Pulmonary Rehab. Cherre Huger, BSN Cardiac and Training and development officer

## 2019-09-19 ENCOUNTER — Encounter (HOSPITAL_COMMUNITY)
Admission: RE | Admit: 2019-09-19 | Discharge: 2019-09-19 | Disposition: A | Discharge status: Home / Self Care | Payer: Medicare Other | Source: Ambulatory Visit | Attending: Internal Medicine | Admitting: Internal Medicine

## 2019-09-19 ENCOUNTER — Other Ambulatory Visit: Payer: Self-pay

## 2019-09-19 DIAGNOSIS — I5032 Chronic diastolic (congestive) heart failure: Secondary | ICD-10-CM

## 2019-09-19 NOTE — Progress Notes (Signed)
Daily Session Note  Patient Details  Name: [name]. MRN: [id number] Date of Birth: 11-13-1940 Referring Provider:     Pulmonary Rehab Walk Test from 08/22/2019 in [place]  Referring Provider [name]      Encounter Date: 09/19/2019  Check In:  Session Check In - 09/19/19 1434      Check-In   Supervising physician immediately available to respond to emergencies [facility] immediately available    Physician(s) Dr. Cathlean Sauer    Location [facility]    Staff Present Maurice Small, RN, Bjorn Loser, MS, CEP, Exercise Physiologist;Breona Cherubin Jani Gravel, MS, ACSM-CEP, Exercise Physiologist    Virtual Visit No    Medication changes reported     No    Fall or balance concerns reported    No    Tobacco Cessation No Change    Warm-up and Cool-down Performed on first and last piece of equipment    Resistance Training Performed Yes    VAD Patient? No    PAD/SET Patient? No      Pain Assessment   Currently in Pain? No/denies    Multiple Pain Sites No           Capillary Blood Glucose: No results found for this or any previous visit (from the past 24 hour(s)).    Social History   Tobacco Use  Smoking Status Former Smoker  . Packs/day: 4.00  . Years: 25.00  . Pack years: 100.00  . Types: Cigarettes  . Quit date: 01/05/1981  . Years since quitting: 38.7  Smokeless Tobacco Never Used    Goals Met:  Exercise tolerated well No report of cardiac concerns or symptoms Strength training completed today  Goals Unmet:  Not Applicable  Comments: Service time is from 1305 to 1410    Dr. Fransico Him is Medical Director for Cardiac Rehab at [facility].

## 2019-09-20 DIAGNOSIS — N2581 Secondary hyperparathyroidism of renal origin: Secondary | ICD-10-CM | POA: Diagnosis not present

## 2019-09-20 DIAGNOSIS — E876 Hypokalemia: Secondary | ICD-10-CM | POA: Diagnosis not present

## 2019-09-20 DIAGNOSIS — N184 Chronic kidney disease, stage 4 (severe): Secondary | ICD-10-CM | POA: Diagnosis not present

## 2019-09-20 DIAGNOSIS — D631 Anemia in chronic kidney disease: Secondary | ICD-10-CM | POA: Diagnosis not present

## 2019-09-20 DIAGNOSIS — I5032 Chronic diastolic (congestive) heart failure: Secondary | ICD-10-CM | POA: Diagnosis not present

## 2019-09-21 ENCOUNTER — Other Ambulatory Visit: Payer: Self-pay

## 2019-09-21 ENCOUNTER — Encounter (HOSPITAL_COMMUNITY)
Admission: RE | Admit: 2019-09-21 | Discharge: 2019-09-21 | Disposition: A | Discharge status: Home / Self Care | Payer: Medicare Other | Source: Ambulatory Visit | Attending: Internal Medicine | Admitting: Internal Medicine

## 2019-09-21 DIAGNOSIS — I5032 Chronic diastolic (congestive) heart failure: Secondary | ICD-10-CM | POA: Diagnosis not present

## 2019-09-21 NOTE — Progress Notes (Signed)
Daily Session Note  Patient Details  Name: Omarrion Carmer. MRN: [id number] Date of Birth: [birth year] Referring Provider:     Pulmonary Rehab Walk Test from 08/22/2019 in [place]  Referring Provider [name]      Encounter Date: 09/21/2019  Check In:  Session Check In - 09/21/19 1516      Check-In   Supervising physician immediately available to respond to emergencies [facility] immediately available    Physician(s) Dr. Teryl Lucy    Location [facility]    Staff Present Maurice Small, RN, BSN;Daphene Chisholm Ysidro Evert, RN;Jessica Hassell Done, MS, ACSM-CEP, Exercise Physiologist    Virtual Visit No    Medication changes reported     No    Fall or balance concerns reported    No    Tobacco Cessation No Change    Warm-up and Cool-down Performed on first and last piece of equipment    Resistance Training Performed Yes    VAD Patient? No    PAD/SET Patient? No      Pain Assessment   Currently in Pain? No/denies    Multiple Pain Sites No           Capillary Blood Glucose: No results found for this or any previous visit (from the past 24 hour(s)).    Social History   Tobacco Use  Smoking Status Former Smoker  . Packs/day: 4.00  . Years: 25.00  . Pack years: 100.00  . Types: Cigarettes  . Quit date: 01/05/1981  . Years since quitting: 38.7  Smokeless Tobacco Never Used    Goals Met:  Exercise tolerated well No report of cardiac concerns or symptoms Strength training completed today  Goals Unmet:  Not Applicable  Comments: Service time is from 1310 to 1430    Dr. Fransico Him is Medical Director for Cardiac Rehab at [facility].

## 2019-09-26 ENCOUNTER — Encounter (HOSPITAL_COMMUNITY)
Admission: RE | Admit: 2019-09-26 | Discharge: 2019-09-26 | Disposition: A | Discharge status: Home / Self Care | Payer: Medicare Other | Source: Ambulatory Visit | Attending: Internal Medicine | Admitting: Internal Medicine

## 2019-09-26 ENCOUNTER — Other Ambulatory Visit: Payer: Self-pay

## 2019-09-26 VITALS — Wt 185.0 lb

## 2019-09-26 DIAGNOSIS — I5032 Chronic diastolic (congestive) heart failure: Secondary | ICD-10-CM | POA: Diagnosis not present

## 2019-09-26 NOTE — Progress Notes (Signed)
Daily Session Note  Patient Details  Name: [name]. MRN: [id number] Date of Birth: [birth year] Referring Provider:     Pulmonary Rehab Walk Test from 08/22/2019 in [place]  Referring Provider [name]      Encounter Date: 09/26/2019  Check In:  Session Check In - 09/26/19 1413      Check-In   Supervising physician immediately available to respond to emergencies [facility] immediately available    Physician(s) Dr. Lonny Prude    [facility]    Staff Present Maurice Small, RN, Bjorn Loser, MS, CEP, Exercise Physiologist;Lisa Jani Gravel, MS, ACSM-CEP, Exercise Physiologist    Virtual Visit No    Medication changes reported     No    Fall or balance concerns reported    No    Tobacco Cessation No Change    Warm-up and Cool-down Performed on first and last piece of equipment    Resistance Training Performed Yes    VAD Patient? No    PAD/SET Patient? No      Pain Assessment   Currently in Pain? Yes    Pain Score 5     Pain Location Hip    Pain Orientation Right    Pain Descriptors / Indicators Aching;Discomfort    Pain Type Chronic pain    Multiple Pain Sites No           Capillary Blood Glucose: No results found for this or any previous visit (from the past 24 hour(s)).  POCT Glucose - 09/26/19 1456      POCT Blood Glucose   Pre-Exercise 174 mg/dL    Post-Exercise 87 mg/dL           Exercise Prescription Changes - 09/26/19 1400      Response to Exercise   Blood Pressure (Admit) 122/68    Blood Pressure (Exercise) 124/64    Blood Pressure (Exit) 134/82    Heart Rate (Admit) 67 bpm    Heart Rate (Exercise) 70 bpm    Heart Rate (Exit) 61 bpm    Oxygen Saturation (Admit) 97 %    Oxygen Saturation (Exercise) 96 %    Oxygen Saturation (Exit) 95 %    Rating of Perceived Exertion (Exercise) 9    Perceived Dyspnea (Exercise) 0    Duration Continue with 30 min of  aerobic exercise without signs/symptoms of physical distress.    Intensity THRR unchanged      Progression   Progression Continue to progress workloads to maintain intensity without signs/symptoms of physical distress.    Average METs 2.2      Resistance Training   Training Prescription Yes    Weight orange bands    Reps 10-15    Time 10 Minutes      NuStep   Level 3    SPM 80    Minutes 15    METs 2.2           Social History   Tobacco Use  Smoking Status Former Smoker  . Packs/day: 4.00  . Years: 25.00  . Pack years: 100.00  . Types: Cigarettes  . Quit date: 01/05/1981  . Years since quitting: 38.7  Smokeless Tobacco Never Used    Goals Met:  Proper associated with RPD/PD & O2 Sat Exercise tolerated well No report of cardiac concerns or symptoms Strength training completed today  Goals Unmet:  Not Applicable  Comments: Service time is from 1315 to 1420  Dr. Traci Turner is Medical Director for Cardiac Rehab at [facility]. 

## 2019-09-28 ENCOUNTER — Encounter (HOSPITAL_COMMUNITY)
Admission: RE | Admit: 2019-09-28 | Discharge: 2019-09-28 | Disposition: A | Discharge status: Home / Self Care | Payer: Medicare Other | Source: Ambulatory Visit | Attending: Internal Medicine | Admitting: Internal Medicine

## 2019-09-28 ENCOUNTER — Other Ambulatory Visit: Payer: Self-pay

## 2019-09-28 DIAGNOSIS — I5032 Chronic diastolic (congestive) heart failure: Secondary | ICD-10-CM

## 2019-09-28 NOTE — Progress Notes (Signed)
Daily Session Note  Patient Details  Name: [name]. MRN: [id number] Date of Birth: 1940-07-01 Referring Provider:     Pulmonary Rehab Walk Test from 08/22/2019 in [place]  Referring Provider [name]      Encounter Date: 09/28/2019  Check In:  Session Check In - 09/28/19 1347      Check-In   Supervising physician immediately available to respond to emergencies [facility] immediately available    Physician(s) Dr. Tyler Pita    [facility]    Staff Present Maurice Small, RN, BSN;Lisa Ysidro Evert, RN;Ezri Fanguy Hassell Done, MS, ACSM-CEP, Exercise Physiologist    Virtual Visit No    Medication changes reported     No    Fall or balance concerns reported    No    Tobacco Cessation No Change    Warm-up and Cool-down Performed on first and last piece of equipment    Resistance Training Performed Yes    VAD Patient? No    PAD/SET Patient? No      Pain Assessment   Currently in Pain? No/denies    Pain Score 0-No pain    Multiple Pain Sites No           Capillary Blood Glucose: No results found for this or any previous visit (from the past 24 hour(s)).    Social History   Tobacco Use  Smoking Status Former Smoker  . Packs/day: 4.00  . Years: 25.00  . Pack years: 100.00  . Types: Cigarettes  . Quit date: 01/05/1981  . Years since quitting: 38.7  Smokeless Tobacco Never Used    Goals Met:  Proper associated with RPD/PD & O2 Sat Exercise tolerated well No report of cardiac concerns or symptoms Strength training completed today  Goals Unmet:  Not Applicable  Comments: Service time is from 1318 to 1425    Dr. Fransico Him is Medical Director for Cardiac Rehab at [facility].

## 2019-09-29 DIAGNOSIS — M79642 Pain in left hand: Secondary | ICD-10-CM | POA: Diagnosis not present

## 2019-09-29 DIAGNOSIS — S61402A Unspecified open wound of left hand, initial encounter: Secondary | ICD-10-CM | POA: Diagnosis not present

## 2019-09-29 DIAGNOSIS — L089 Local infection of the skin and subcutaneous tissue, unspecified: Secondary | ICD-10-CM | POA: Diagnosis not present

## 2019-09-29 DIAGNOSIS — S60512A Abrasion of left hand, initial encounter: Secondary | ICD-10-CM | POA: Diagnosis not present

## 2019-10-02 NOTE — Progress Notes (Signed)
Assessment and Plan:  [name] (paroxysmal atrial fibrillation) (HCC) Check INR and will adjust medication according to labs in 4 weeks pending results  Discussed if patient falls to immediately contact office or go to ER. Discussed foods that can increase or decrease Coumadin levels.   Acquired thrombophilia (HCC) No S&S of bleeding   Chronic diastolic congestive heart failure (HCC) Encouraged daily monitoring of the patient's weight Continue follow up with cardiology Taking torsemide 157m and potassium 267m Coreg 6.253mhalf tablet BID  Senile purpura (HCC) Discussed process, protect skin, sunscreen   Type 2 diabetes mellitus with stage 4 chronic kidney disease, with long-term current use of insulin (HCC) Monitor- check with cardiology- continue to monitor kidney function Close monitoring on blood glucose, off related to abrasion healing, ABX? Decrease evening dose if hypoglycemia in am.   Abrasion to left hand, subsequent Discussed cleansing with soap and water, pat dry Discussed S&S of infection Complete Cephalexin course as prescribed. Continue to monitor  Medication management Continued  Contact office with any new or worsening symtpoms   Further disposition pending results of labs. Discussed med's effects and SE's.   Over 30 minutes of face to face interview exam, counseling, chart review, and critical decision making was performed.   Future Appointments  Date Time Provider DepNewberry/30/2021  1:15 PM [facility]ERGRAD [facility] None  10/10/2019  1:15 PM [facility] None  10/11/2019 10:00 AM McKUnk PintoD [facility] None  10/12/2019  1:15 PM [facility] None  10/17/2019  1:15 PM [facility] None  10/19/2019  1:15 PM [facility] None  10/24/2019  1:15 PM [facility] None  10/26/2019  1:15 PM [facility]  UNDERGRAD [facility] None  01/19/2020 12:30 PM [name] TRE-TRE None  03/13/2020 11:30 AM [facility] None  03/13/2020 12:00 PM KalBrunetta GeneraD [facility] None    ------------------------------------------------------------------------------------------------------------------   HPI BP 126/72   Pulse 69   Temp 97.9 F (36.6 C)   Wt 187 lb 6.4 oz (85 kg)   SpO2 95%   BMI 26.14 kg/m   79 y.o.male presents for follow up on coumadin for p. A. fib, CHF, HTN, CKD III/IV.  He is currently going to out patient Pulmonary Rehabilitation is going well.  He reports he wishes they made it a little it harder.  He is going two days a week for pulmonary and 3 days a week for cardiac rehabilitation.  He did not go to Pulmonary rehabilitation today as he was not feeling well related to blood glucose being out of range.  He denies any change in his diet.  He is giving his morning insulin NPH 70/30 with breakfast, 45 units and 30units in the evening.  DMII:  [id number] 63 1200-77 1217-62 1224 - 63 1439 - 231    Patient had recent RHCEnterpriseth Dr. BenSung Amabileardiology is treating carvedilol 6.25 mg - takes 1/2 tab BID due to hypotension.  Wt Readings from Last 5 Encounters:  10/03/19 187 lb 6.4 oz (85 kg)  09/26/19 184 lb 15.5 oz (83.9 kg)  09/13/19 183 lb 6.4 oz (83.2 kg)  09/12/19 182 lb 15.7 oz (83 kg)  09/05/19 185 lb (83.9 kg)   Lab Results  Component Value Date   CREATININE 1.84 (H) 09/13/2019   BUN 37 (H) 09/13/2019   NA 142 09/13/2019   K 3.3 (L) 09/13/2019   CL 101 09/13/2019  CO2 33 (H) 09/13/2019   He has seen Dr. Irene Limbo for anemia,  He has had iron infusion that helped resolved dizziness.   He is also wearing compression stockings. He is taking iron one tablet daily.  Patient is on Coumadin for [name] (paroxysmal atrial fibrillation) ([name]) [I48.0] Patient's last INR is  Lab Results  Component Value Date   INR 1.5 (H) 09/05/2019    INR 1.2 (H) 08/03/2019   INR 2.1 (H) 07/06/2019    Patient denies chest pains, shortness of breath or blood in stool/urine.  His coumadin goal was changed last visit due to acute vitreous hemorrhage of his right eye, his new goal is 2-2.5.   Current dose: changed last OV to 1.5 tabs (7.88m) 6 days a week, with 1 tab(576m  one day a week.   He has taken ABX for wound to his left hand.  He is taking cephalexin 50056mID and has three days remaining of this treatment.  He has not missed doses of coumadin.  Has not missed any doses of coumadin. Denies falls.  Lab Results  Component Value Date   HGB 12.5 (L) 09/13/2019   HGB 12.6 (L) 09/05/2019   HGB 11.9 (L) 08/03/2019    Had remained on iron supplement which he reported caused constipation but has been managing.  No night sweats, no rash. Has had normal phosphorus at nephrology.  Lab Results  Component Value Date   IRON 80 09/13/2019   TIBC 273 09/13/2019   FERRITIN 173 09/13/2019   Lab Results  Component Value Date   GFRNONAA 34 (L) 09/13/2019   Lab Results  Component Value Date   CALCIUM 8.9 09/13/2019   Phosphorous has been normal  Past Medical History:  Diagnosis Date  . Adrenal adenoma   . Anemia   . Anxiety   . Arthritis   . Atypical atrial flutter (HCCEvansdale/15, 10/15   a. DCCV 08/2013. b. s/p RFA 10/2013.  . BMarland Kitchensal cell carcinoma   . CAD (coronary artery disease)    a. 04/2013 CABG x 2: LIMA to LAD, SVG to RI, EVH via R thigh.  . Cellulitis 12/2015   left leg  . Chronic diastolic congestive heart failure (HCCSpicer . CKD (chronic kidney disease), stage III   . Depression   . Diabetes mellitus type II   . Diverticulosis 2001  . DJD (degenerative joint disease)   . GERD (gastroesophageal reflux disease)   . Gout   . H/O hiatal hernia   . History of cardioversion    x3 (years uncertain)  . Hx of adenomatous colonic polyps   . Hyperlipidemia   . Hypertension   . Hypertensive cardiomyopathy (HCCChestnut . Hypertensive  retinopathy    OU  . Macular degeneration    OU  . Obstructive sleep apnea    compliant with CPAP  . 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 Persistent atrial fibrillation (HCCSwain  a. s/p MAZE 04/2013 in setting of CABG. b. Amio stopped in 10/2013 after flutter ablation.  . PMarland KitchenO (patent foramen ovale)    a. Small PFO by TEE 10/2013.  . PMarland Kitcheneural effusion, left    a. s/p thoracentesis 05/2013.  . RMarland Kitchenspiratory failure (HCCVillas  a. Hypoxia 10/2013 - required supp O2 as inpatient, did not require it at discharge.  . S/P Maze operation for atrial fibrillation    a.  04/2013: Complete bilateral atrial lesion set using cryothermy and bipolar radiofrequency ablation with clipping of LA appendage (@ time of CABG)  . S/P Maze operation for atrial fibrillation 04/05/2013   Complete bilateral atrial lesion set using cryothermy and bipolar radiofrequency ablation with clipping of LA appendage via median sternotomy approach      Allergies  Allergen Reactions  . Sunflower Seed [Sunflower Oil] Swelling and Other (See Comments)    Tongue and lip swelling  . Horse-Derived Products Other (See Comments)    Per allergy skin test UNSPECIFIED REACTION   . Tetanus Toxoids Other (See Comments)    Per allergy skin test  . Tetanus Toxoid     Other reaction(s): Other (See Comments) Rash(horse serum)     Current Outpatient Medications (Endocrine & Metabolic):  .  insulin NPH-regular Human (70-30) 100 UNIT/ML injection, Inject 25-45 Units into the skin See admin instructions. 45 units in the morning and 30 units in the evening  Current Outpatient Medications (Cardiovascular):  .  atorvastatin (LIPITOR) 20 MG tablet, Take 1 tablet 3 x /week (Patient taking differently: Take 20 mg by mouth 3 (three) times a week. Take 1 tablet 3 x /week) .  carvedilol (COREG) 6.25 MG tablet, Take 0.5 tablets (3.125 mg total) by  mouth in the morning AND 1 tablet (6.25 mg total) every evening. .  metolazone (ZAROXOLYN) 2.5 MG tablet, TAKE 1 TABLET BY MOUTH  DAILY AS NEEDED FOR  SWELLING (Patient taking differently: Take 2.5 mg by mouth daily as needed (swelling). ) .  torsemide (DEMADEX) 100 MG tablet, Take 150 mg by mouth daily.  Current Outpatient Medications (Respiratory):  .  azelastine (ASTELIN) 0.1 % nasal spray, Place 2 sprays into both nostrils 2 (two) times daily as needed for rhinitis or allergies. .  diphenhydrAMINE (BENADRYL) 25 MG tablet, Take 25 mg by mouth at bedtime as needed for allergies.  .  fluticasone (FLONASE) 50 MCG/ACT nasal spray, Place 1 spray into both nostrils daily as needed for allergies. [name]  loratadine (CLARITIN) 10 MG tablet, Take 10 mg by mouth daily as needed for allergies.   Current Outpatient Medications (Analgesics):  .  acetaminophen (TYLENOL) 500 MG tablet, Take 1,000 mg by mouth every 6 (six) hours as needed for moderate pain or headache.  .  allopurinol (ZYLOPRIM) 300 MG tablet, Take 150 mg by mouth daily. [name]  aspirin EC 81 MG tablet, Take 81 mg by mouth daily.  Current Outpatient Medications (Hematological):  .  ferrous sulfate 325 (65 FE) MG EC tablet, Take 325 mg by mouth 3 (three) times a week. Take with vitamin C  .  warfarin (COUMADIN) 5 MG tablet, Take 5-7.5 mg by mouth See admin instructions. 1.5 tablets 4 days a week. Alternating with 1 tablets on MWF (total 9 tabs/week)   Current Outpatient Medications (Other):  [name]  ALPRAZolam (XANAX) 1 MG tablet, Take 0.5 tablets (0.5 mg total) by mouth at bedtime as needed for sleep. [name]  Alum Hydroxide-Mag Carbonate (GAVISCON PO), Take 4 tablets by mouth daily as needed (acid reflux).  .  B Complex Vitamins (VITAMIN B COMPLEX PO), Take 1 tablet by mouth at bedtime.  .  bacitracin 500 UNIT/GM ointment, Apply 1 application topically daily as needed for wound care. .  Blood Glucose Monitoring Suppl (ACCU-CHEK AVIVA PLUS) w/Device KIT,  Check blood sugar 1 time  daily .  Blood Glucose Monitoring Suppl DEVI, Test blood sugar up to three times a day or as directed. .  Cholecalciferol (VITAMIN D  PO), Take 5,000 Units by mouth daily.  .  Continuous Blood Gluc Receiver (FREESTYLE LIBRE 14 DAY READER) DEVI, 1 Device by Does not apply route 3 (three) times daily. Dx: E11.22, N18.4, Z79.4 .  Continuous Blood Gluc Sensor (FREESTYLE LIBRE 14 DAY SENSOR) MISC, Check blood sugar 3 times a day. Dx: E11.22, N18.4, Z79.4 .  glucose blood (ACCU-CHEK AVIVA PLUS) test strip, CHECK BLOOD GLUCOSE 3 TIMES DAILY. [name]  lidocaine (LIDODERM) 5 %, Place 1 patch onto the skin daily as needed (for pain). Remove & Discard patch within 12 hours or as directed by MD  .  Multiple Vitamins-Minerals (PRESERVISION AREDS 2) CAPS, Take 1 capsule by mouth 2 (two) times daily.  .  polyvinyl alcohol (ARTIFICIAL TEARS) 1.4 % ophthalmic solution, Place 1 drop into both eyes daily as needed for dry eyes. .  potassium chloride SA (KLOR-CON) 20 MEQ tablet, Take 3 tablets (60 mEq total) by mouth in the morning AND 2 tablets (40 mEq total) daily after lunch AND 2 tablets (40 mEq total) every evening. .  sertraline (ZOLOFT) 100 MG tablet, Take 1 tablet Daily for Mood (Patient taking differently: Take 100 mg by mouth daily. Take 1 tablet Daily for Mood)  ROS: Review of Systems  Constitutional: Negative for chills, diaphoresis, fever, malaise/fatigue and weight loss.  HENT: Negative for ear pain, hearing loss and tinnitus.   Eyes: Negative for blurred vision and double vision.  Respiratory: Negative for cough and shortness of breath.   Cardiovascular: Negative for chest pain, palpitations, leg swelling and PND.  Gastrointestinal: Negative for abdominal pain, blood in stool, constipation, diarrhea, heartburn, melena, nausea and vomiting.  Genitourinary: Negative for dysuria and urgency.  Skin: Negative for itching and rash.       Left hand abrasion  Neurological: Negative for  dizziness, sensory change, weakness and headaches.  Endo/Heme/Allergies: Bruises/bleeds easily.     Physical Exam:  BP 126/72   Pulse 69   Temp 97.9 F (36.6 C)   Wt 187 lb 6.4 oz (85 kg)   SpO2 95%   BMI 26.14 kg/m   General Appearance: Well nourished, in no apparent distress. Eyes: PERRLA, EOMs, conjunctiva no swelling or erythema Sinuses: No Frontal/maxillary tenderness ENT/Mouth: Ext aud canals clear, TMs without erythema, bulging. No erythema, swelling, or exudate on post pharynx.  Tonsils not swollen or erythematous. Hearing normal.  Neck: Supple, thyroid normal.  Respiratory: Respiratory effort normal, BS equal bilaterally without rales, rhonchi, wheezing or stridor.  Cardio: RRR with no MRGs, systolic murmur. Brisk peripheral pulses with trace bilateral edema.  Abdomen: Soft, obese/mildly distended, + BS.  Non tender, no guarding, rebound, hernias, masses. Lymphatics: Non tender without lymphadenopathy.   Musculoskeletal: Full ROM, 5/5 strength, normal gait.  Skin: Warm, dry without rashes, lesions; he has fragile skin and numerous small ecchymoses to bilateral upper extremities. Left hand, laceration, well approximated, no exudate noted, mild erythema, blanchable, non-tender. Neuro: Cranial nerves intact. Normal muscle tone, no cerebellar symptoms. Sensation intact.  Psych: Awake and oriented X 3, normal affect, Insight and Judgment appropriate.    Garnet Sierras, NP 4:44 PM [facility]

## 2019-10-03 ENCOUNTER — Encounter (HOSPITAL_COMMUNITY): Payer: Medicare Other

## 2019-10-03 ENCOUNTER — Encounter: Payer: Self-pay | Admitting: Adult Health Nurse Practitioner

## 2019-10-03 ENCOUNTER — Ambulatory Visit (INDEPENDENT_AMBULATORY_CARE_PROVIDER_SITE_OTHER): Payer: Medicare Other | Admitting: Adult Health Nurse Practitioner

## 2019-10-03 ENCOUNTER — Telehealth (HOSPITAL_COMMUNITY): Payer: Self-pay | Admitting: Internal Medicine

## 2019-10-03 ENCOUNTER — Other Ambulatory Visit: Payer: Self-pay

## 2019-10-03 VITALS — BP 126/72 | HR 69 | Temp 97.9°F | Wt 187.4 lb

## 2019-10-03 DIAGNOSIS — S60512D Abrasion of left hand, subsequent encounter: Secondary | ICD-10-CM

## 2019-10-03 DIAGNOSIS — N184 Chronic kidney disease, stage 4 (severe): Secondary | ICD-10-CM

## 2019-10-03 DIAGNOSIS — I5032 Chronic diastolic (congestive) heart failure: Secondary | ICD-10-CM

## 2019-10-03 DIAGNOSIS — I480 Paroxysmal atrial fibrillation: Secondary | ICD-10-CM | POA: Diagnosis not present

## 2019-10-03 DIAGNOSIS — Z794 Long term (current) use of insulin: Secondary | ICD-10-CM

## 2019-10-03 DIAGNOSIS — D6869 Other thrombophilia: Secondary | ICD-10-CM | POA: Diagnosis not present

## 2019-10-03 DIAGNOSIS — E1122 Type 2 diabetes mellitus with diabetic chronic kidney disease: Secondary | ICD-10-CM | POA: Diagnosis not present

## 2019-10-03 NOTE — Patient Instructions (Signed)
Continue to monitor your blood glucose.  If you continue to have lows in the morning, we can decrease your insulin dose in the evening.  W will contact you via MyChart with your lab results.    Ask insurance and pharmacy about shingrix - it is a 2 part shot that we will not be getting in the office.   Suggest getting AFTER covid vaccines, have to wait at least a month This shot can make you feel bad due to such good immune response it can trigger some inflammation so take tylenol or aleve day of or day after and plan on resting.   Can go to [email or web address] for more information  Shingrix Vaccination  Two vaccines are licensed and recommended to prevent shingles in the [place] Zoster vaccine live (ZVL, Zostavax) has been in use since 2006. Recombinant zoster vaccine (RZV, Shingrix), has been in use since 2017 and is recommended by ACIP as the preferred shingles vaccine.  What Everyone Should Know about Shingles Vaccine (Shingrix) One of the Recommended Vaccines by Disease Shingles vaccination is the only way to protect against shingles and postherpetic neuralgia (PHN), the most common complication from shingles. CDC recommends that healthy adults 50 years and older get two doses of the shingles vaccine called Shingrix (recombinant zoster vaccine), separated by 2 to 6 months, to prevent shingles and the complications from the disease. Your doctor or pharmacist can give you Shingrix as a shot in your upper arm. Shingrix provides strong protection against shingles and PHN. Two doses of Shingrix is more than 90% effective at preventing shingles and PHN. Protection stays above 85% for at least the first four years after you get vaccinated. Shingrix is the preferred vaccine, over Zostavax (zoster vaccine live), a shingles vaccine in use since 2006. Zostavax may still be used to prevent shingles in healthy adults 60 years and older. For example, you  could use Zostavax if a person is allergic to Shingrix, prefers Zostavax, or requests immediate vaccination and Shingrix is unavailable. Who Should Get Shingrix? Healthy adults 50 years and older should get two doses of Shingrix, separated by 2 to 6 months. You should get Shingrix even if in the past you . had shingles  . received Zostavax  . are not sure if you had chickenpox There is no maximum age for getting Shingrix. If you had shingles in the past, you can get Shingrix to help prevent future occurrences of the disease. There is no specific length of time that you need to wait after having shingles before you can receive Shingrix, but generally you should make sure the shingles rash has gone away before getting vaccinated. You can get Shingrix whether or not you remember having had chickenpox in the past. Studies show that more than 99% of Americans 40 years and older have had chickenpox, even if they don't remember having the disease. Chickenpox and shingles are related because they are caused by the same virus (varicella zoster virus). After a person recovers from chickenpox, the virus stays dormant (inactive) in the body. It can reactivate years later and cause shingles. If you had Zostavax in the recent past, you should wait at least eight weeks before getting Shingrix. Talk to your healthcare provider to determine the best time to get Shingrix. Shingrix is available in [employer] and pharmacies. To find doctor's offices or pharmacies near you that offer the vaccine, visit HealthMap Vaccine FinderExternal. If you have questions about Shingrix, talk with your healthcare provider. Vaccine for Those 50  Years and Older  Shingrix reduces the risk of shingles and PHN by more than 90% in people 19 and older. CDC recommends the vaccine for healthy adults 65 and older.  Who Should Not Get Shingrix? You should not get Shingrix if you: . have ever had a severe allergic reaction to any component  of the vaccine or after a dose of Shingrix  . tested negative for immunity to varicella zoster virus. If you test negative, you should get chickenpox vaccine.  . currently have shingles  . currently are pregnant or breastfeeding. Women who are pregnant or breastfeeding should wait to get Shingrix.  [name] receive specific antiviral drugs (acyclovir, famciclovir, or valacyclovir) 24 hours before vaccination (avoid use of these antiviral drugs for 14 days after vaccination)- zoster vaccine live only If you have a minor acute (starts suddenly) illness, such as a cold, you may get Shingrix. But if you have a moderate or severe acute illness, you should usually wait until you recover before getting the vaccine. This includes anyone with a temperature of 101.36F or higher. The side effects of the Shingrix are temporary, and usually last 2 to 3 days. While you may experience pain for a few days after getting Shingrix, the pain will be less severe than having shingles and the complications from the disease. How Well Does Shingrix Work? Two doses of Shingrix provides strong protection against shingles and postherpetic neuralgia (PHN), the most common complication of shingles. . In adults 70 to 80 years old who got two doses, Shingrix was 97% effective in preventing shingles; among adults 70 years and older, Shingrix was 91% effective.  . In adults 13 to 80 years old who got two doses, Shingrix was 91% effective in preventing PHN; among adults 70 years and older, Shingrix was 89% effective. Shingrix protection remained high (more than 85%) in people 70 years and older throughout the four years following vaccination. Since your risk of shingles and PHN increases as you get older, it is important to have strong protection against shingles in your older years. Top of Page  What Are the Possible Side Effects of Shingrix? Studies show that Shingrix is safe. The vaccine helps your body create a strong defense against  shingles. As a result, you are likely to have temporary side effects from getting the shots. The side effects may affect your ability to do normal daily activities for 2 to 3 days. Most people got a sore arm with mild or moderate pain after getting Shingrix, and some also had redness and swelling where they got the shot. Some people felt tired, had muscle pain, a headache, shivering, fever, stomach pain, or nausea. About 1 out of 6 people who got Shingrix experienced side effects that prevented them from doing regular activities. Symptoms went away on their own in about 2 to 3 days. Side effects were more common in younger people. You might have a reaction to the first or second dose of Shingrix, or both doses. If you experience side effects, you may choose to take over-the-counter pain medicine such as ibuprofen or acetaminophen. If you experience side effects from Shingrix, you should report them to the Vaccine Adverse Event Reporting System (VAERS). Your doctor might file this report, or you can do it yourself through the VAERS websiteExternal, or by calling [phone]. If you have any questions about side effects from Shingrix, talk with your doctor. The shingles vaccine does not contain thimerosal (a preservative containing mercury). Top of Page  When Should I  See a Doctor Because of the Side Effects I Experience From Shingrix? In clinical trials, Shingrix was not associated with serious adverse events. In fact, serious side effects from vaccines are extremely rare. For example, for every 1 million doses of a vaccine given, only one or two people may have a severe allergic reaction. Signs of an allergic reaction happen within minutes or hours after vaccination and include hives, swelling of the face and throat, difficulty breathing, a fast heartbeat, dizziness, or weakness. If you experience these or any other life-threatening symptoms, see a doctor right away. Shingrix causes a strong response in  your immune system, so it may produce short-term side effects more intense than you are used to from other vaccines. These side effects can be uncomfortable, but they are expected and usually go away on their own in 2 or 3 days. Top of Page  How Can I Pay For Shingrix? There are several ways shingles vaccine may be paid for: Medicare . Medicare Part D plans cover the shingles vaccine, but there may be a cost to you depending on your plan. There may be a copay for the vaccine, or you may need to pay in full then get reimbursed for a certain amount.  . Medicare Part B does not cover the shingles vaccine. Medicaid . Medicaid may or may not cover the vaccine. Contact your insurer to find out. Private health insurance . Many private health insurance plans will cover the vaccine, but there may be a cost to you depending on your plan. Contact your insurer to find out. Vaccine assistance programs . Some pharmaceutical companies provide vaccines to eligible adults who cannot afford them. You may want to check with the vaccine manufacturer, [employer], about Shingrix. If you do not currently have health insurance, learn more about affordable health coverage optionsExternal. To find doctor's offices or pharmacies near you that offer the vaccine, visit HealthMap Vaccine FinderExternal.

## 2019-10-04 LAB — PROTIME-INR
INR: 1.7 — ABNORMAL HIGH
Prothrombin Time: 18.2 s — ABNORMAL HIGH (ref 9.0–11.5)

## 2019-10-04 LAB — CBC WITH DIFFERENTIAL/PLATELET
Absolute Monocytes: 638 cells/uL (ref 200–950)
Basophils Absolute: 42 cells/uL (ref 0–200)
Basophils Relative: 0.5 %
Eosinophils Absolute: 160 cells/uL (ref 15–500)
Eosinophils Relative: 1.9 %
HCT: 39.6 % (ref 38.5–50.0)
Hemoglobin: 12.7 g/dL — ABNORMAL LOW (ref 13.2–17.1)
Lymphs Abs: 697 cells/uL — ABNORMAL LOW (ref 850–3900)
MCH: 30.5 pg (ref 27.0–33.0)
MCHC: 32.1 g/dL (ref 32.0–36.0)
MCV: 95 fL (ref 80.0–100.0)
MPV: 9.4 fL (ref 7.5–12.5)
Monocytes Relative: 7.6 %
Neutro Abs: 6863 cells/uL (ref 1500–7800)
Neutrophils Relative %: 81.7 %
Platelets: 178 10*3/uL (ref 140–400)
RBC: 4.17 10*6/uL — ABNORMAL LOW (ref 4.20–5.80)
RDW: 16.4 % — ABNORMAL HIGH (ref 11.0–15.0)
Total Lymphocyte: 8.3 %
WBC: 8.4 10*3/uL (ref 3.8–10.8)

## 2019-10-05 ENCOUNTER — Encounter (HOSPITAL_COMMUNITY)
Admission: RE | Admit: 2019-10-05 | Discharge: 2019-10-05 | Disposition: A | Discharge status: Home / Self Care | Payer: Medicare Other | Source: Ambulatory Visit | Attending: Internal Medicine | Admitting: Internal Medicine

## 2019-10-05 ENCOUNTER — Other Ambulatory Visit: Payer: Self-pay

## 2019-10-05 DIAGNOSIS — I5032 Chronic diastolic (congestive) heart failure: Secondary | ICD-10-CM

## 2019-10-05 NOTE — Progress Notes (Signed)
Daily Session Note  Patient Details  Name: [name]. MRN: [id number] Date of Birth: July 03, 1940 Referring Provider:     Pulmonary Rehab Walk Test from 08/22/2019 in [place]  Referring Provider [name]      Encounter Date: 10/05/2019  Check In:  Session Check In - 10/05/19 1348      Check-In   Supervising physician immediately available to respond to emergencies [facility] immediately available    Physician(s) Dr. Doristine Bosworth    Location [facility]    Staff Present Maurice Small, RN, Bjorn Loser, MS, CEP, Exercise Physiologist;Jessica Hassell Done, MS, ACSM-CEP, Exercise Physiologist    Virtual Visit No    Medication changes reported     No    Fall or balance concerns reported    No    Tobacco Cessation No Change    Warm-up and Cool-down Performed on first and last piece of equipment    Resistance Training Performed Yes    VAD Patient? No    PAD/SET Patient? No      Pain Assessment   Currently in Pain? No/denies    Pain Score 0-No pain    Multiple Pain Sites No           Capillary Blood Glucose: No results found for this or any previous visit (from the past 24 hour(s)).    Social History   Tobacco Use  Smoking Status Former Smoker  . Packs/day: 4.00  . Years: 25.00  . Pack years: 100.00  . Types: Cigarettes  . Quit date: 01/05/1981  . Years since quitting: 38.7  Smokeless Tobacco Never Used    Goals Met:  Independence with exercise equipment Exercise tolerated well No report of cardiac concerns or symptoms Strength training completed today  Goals Unmet:  Not Applicable  Comments: Service time is from 1315 to 1427    Dr. Fransico Him is Medical Director for Cardiac Rehab at [facility].

## 2019-10-05 NOTE — Progress Notes (Signed)
Pulmonary Individual Treatment Plan  Patient Details  Name: [name]. MRN: [id number] Date of Birth: 1940-02-14 Referring Provider:     Pulmonary Rehab Walk Test from 08/22/2019 in [facility]  Referring Provider [name]      Initial Encounter Date:    Pulmonary Rehab Walk Test from 08/22/2019 in [facility]  Date 08/22/19      Visit Diagnosis: Chronic diastolic (congestive) heart failure (HCC)  Patient's Home Medications on Admission:   Current Outpatient Medications:  .  acetaminophen (TYLENOL) 500 MG tablet, Take 1,000 mg by mouth every 6 (six) hours as needed for moderate pain or headache. , Disp: , Rfl:  .  allopurinol (ZYLOPRIM) 300 MG tablet, Take 150 mg by mouth daily., Disp: , Rfl:  .  ALPRAZolam (XANAX) 1 MG tablet, Take 0.5 tablets (0.5 mg total) by mouth at bedtime as needed for sleep., Disp: 60 tablet, Rfl: 0 .  Alum Hydroxide-Mag Carbonate (GAVISCON PO), Take 4 tablets by mouth daily as needed (acid reflux). , Disp: , Rfl:  .  aspirin EC 81 MG tablet, Take 81 mg by mouth daily., Disp: , Rfl:  .  atorvastatin (LIPITOR) 20 MG tablet, Take 1 tablet 3 x /week (Patient taking differently: Take 20 mg by mouth 3 (three) times a week. Take 1 tablet 3 x /week), Disp: 36 tablet, Rfl: 3 .  azelastine (ASTELIN) 0.1 % nasal spray, Place 2 sprays into both nostrils 2 (two) times daily as needed for rhinitis or allergies., Disp: , Rfl:  .  B Complex Vitamins (VITAMIN B COMPLEX PO), Take 1 tablet by mouth at bedtime. , Disp: , Rfl:  .  bacitracin 500 UNIT/GM ointment, Apply 1 application topically daily as needed for wound care., Disp: , Rfl:  .  Blood Glucose Monitoring Suppl (ACCU-CHEK AVIVA PLUS) w/Device KIT, Check blood sugar 1 time  daily, Disp: 1 kit, Rfl: 0 .  Blood Glucose Monitoring Suppl DEVI, Test blood sugar up to three times a day or as directed., Disp: 1 each, Rfl: 0 .  carvedilol (COREG) 6.25 MG  tablet, Take 0.5 tablets (3.125 mg total) by mouth in the morning AND 1 tablet (6.25 mg total) every evening., Disp: 270 tablet, Rfl: 1 .  Cholecalciferol (VITAMIN D PO), Take 5,000 Units by mouth daily. , Disp: , Rfl:  .  Continuous Blood Gluc Receiver (FREESTYLE LIBRE 14 DAY READER) DEVI, 1 Device by Does not apply route 3 (three) times daily. Dx: E11.22, N18.4, Z79.4, Disp: 1 each, Rfl: 0 .  Continuous Blood Gluc Sensor (FREESTYLE LIBRE 14 DAY SENSOR) MISC, Check blood sugar 3 times a day. Dx: E11.22, N18.4, Z79.4, Disp: 2 each, Rfl: 12 .  diphenhydrAMINE (BENADRYL) 25 MG tablet, Take 25 mg by mouth at bedtime as needed for allergies. , Disp: , Rfl:  .  ferrous sulfate 325 (65 FE) MG EC tablet, Take 325 mg by mouth 3 (three) times a week. Take with vitamin C , Disp: , Rfl:  .  fluticasone (FLONASE) 50 MCG/ACT nasal spray, Place 1 spray into both nostrils daily as needed for allergies., Disp: 16 g, Rfl: 3 .  glucose blood (ACCU-CHEK AVIVA PLUS) test strip, CHECK BLOOD GLUCOSE 3 TIMES DAILY., Disp: 300 strip, Rfl: 3 .  insulin NPH-regular Human (70-30) 100 UNIT/ML injection, Inject 25-45 Units into the skin See admin instructions. 45 units in the morning and 30 units in the evening, Disp: , Rfl:  .  lidocaine (LIDODERM)  5 %, Place 1 patch onto the skin daily as needed (for pain). Remove & Discard patch within 12 hours or as directed by MD , Disp: , Rfl:  .  loratadine (CLARITIN) 10 MG tablet, Take 10 mg by mouth daily as needed for allergies. , Disp: , Rfl:  .  metolazone (ZAROXOLYN) 2.5 MG tablet, TAKE 1 TABLET BY MOUTH  DAILY AS NEEDED FOR  SWELLING (Patient taking differently: Take 2.5 mg by mouth daily as needed (swelling). ), Disp: 60 tablet, Rfl: 0 .  Multiple Vitamins-Minerals (PRESERVISION AREDS 2) CAPS, Take 1 capsule by mouth 2 (two) times daily. , Disp: , Rfl:  .  polyvinyl alcohol (ARTIFICIAL TEARS) 1.4 % ophthalmic solution, Place 1 drop into both eyes daily as needed for dry eyes., Disp:  , Rfl:  .  potassium chloride SA (KLOR-CON) 20 MEQ tablet, Take 3 tablets (60 mEq total) by mouth in the morning AND 2 tablets (40 mEq total) daily after lunch AND 2 tablets (40 mEq total) every evening., Disp: 540 tablet, Rfl: 3 .  sertraline (ZOLOFT) 100 MG tablet, Take 1 tablet Daily for Mood (Patient taking differently: Take 100 mg by mouth daily. Take 1 tablet Daily for Mood), Disp: 90 tablet, Rfl: 3 .  torsemide (DEMADEX) 100 MG tablet, Take 150 mg by mouth daily., Disp: , Rfl:  .  warfarin (COUMADIN) 5 MG tablet, Take 5-7.5 mg by mouth See admin instructions. 1.5 tablets 4 days a week. Alternating with 1 tablets on MWF (total 9 tabs/week) , Disp: , Rfl:   Past Medical History: Past Medical History:  Diagnosis Date  . Adrenal adenoma   . Anemia   . Anxiety   . Arthritis   . Atypical atrial flutter ([place]) 8/15, 10/15   a. DCCV 08/2013. b. s/p RFA 10/2013.  [name] Basal cell carcinoma   . CAD (coronary artery disease)    a. 04/2013 CABG x 2: LIMA to LAD, SVG to RI, EVH via R thigh.  . Cellulitis 12/2015   left leg  . Chronic diastolic congestive heart failure ([place])   . CKD (chronic kidney disease), stage III   . Depression   . Diabetes mellitus type II   . Diverticulosis 2001  . DJD (degenerative joint disease)   . GERD (gastroesophageal reflux disease)   . Gout   . H/O hiatal hernia   . History of cardioversion    x3 (years uncertain)  . Hx of adenomatous colonic polyps   . Hyperlipidemia   . Hypertension   . Hypertensive cardiomyopathy ([place])   . Hypertensive retinopathy    OU  . Macular degeneration    OU  . Obstructive sleep apnea    compliant with CPAP  . 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 Persistent atrial fibrillation ([place])    a. s/p MAZE 04/2013 in setting of CABG. b. Amio stopped in 10/2013 after flutter ablation.  [name] PFO (patent foramen ovale)    a. Small  PFO by TEE 10/2013.  [name] Pleural effusion, left    a. s/p thoracentesis 05/2013.  [name] Respiratory failure ([place])    a. Hypoxia 10/2013 - required supp O2 as inpatient, did not require it at discharge.  . S/P Maze operation for atrial fibrillation    a. 04/2013: Complete bilateral atrial lesion set using cryothermy and bipolar radiofrequency ablation with clipping of LA appendage (@ time of CABG)  . S/P Maze  operation for atrial fibrillation 04/05/2013   Complete bilateral atrial lesion set using cryothermy and bipolar radiofrequency ablation with clipping of LA appendage via median sternotomy approach     Tobacco Use: Social History   Tobacco Use  Smoking Status Former Smoker  . Packs/day: 4.00  . Years: 25.00  . Pack years: 100.00  . Types: Cigarettes  . Quit date: 01/05/1981  . Years since quitting: 38.7  Smokeless Tobacco Never Used    Labs: Recent Chemical engineer    Labs for ITP Cardiac and Pulmonary Rehab Latest Ref Rng & Units 11/15/2018 03/28/2019 07/06/2019 07/06/2019 07/06/2019   Cholestrol <200 mg/dL 138 163 - - -   LDLCALC mg/dL (calc) 80 95 - - -   HDL > OR = 40 mg/dL 28(L) 31(L) - - -   Trlycerides <150 mg/dL 205(H) 257(H) - - -   Hemoglobin A1c <5.7 % of total Hgb 7.1(H) 7.4(H) - - -   PHART 7.35 - 7.45 - - - - -   PCO2ART 35 - 45 mmHg - - - - -   HCO3 20.0 - 28.0 mmol/L - - 30.4(H) 30.9(H) 30.2(H)   TCO2 22 - 32 mmol/L - - 31 32 31   ACIDBASEDEF 0.0 - 2.0 mmol/L - - - - -   O2SAT % - - 65.0 67.0 52.0      Capillary Blood Glucose: Lab Results  Component Value Date   GLUCAP 253 (H) 08/31/2019   GLUCAP 101 (H) 07/06/2019   GLUCAP 248 (H) 03/05/2017   GLUCAP 270 (H) 03/05/2017   GLUCAP 189 (H) 03/05/2017    POCT Glucose    Row Name 09/12/19 1508 09/26/19 1456           POCT Blood Glucose   Pre-Exercise 97 mg/dL 174 mg/dL      Post-Exercise 103 mg/dL 87 mg/dL             Pulmonary Assessment Scores:  Pulmonary Assessment Scores    Row Name 08/22/19  1538 08/22/19 1603       ADL UCSD   ADL Phase Entry Entry    SOB Score total 25 --      CAT Score   CAT Score 8 --      mMRC Score   mMRC Score -- 2          UCSD: Self-administered rating of dyspnea associated with activities of daily living (ADLs) 6-point scale (0 = "not at all" to 5 = "maximal or unable to do because of breathlessness")  Scoring Scores range from 0 to 120.  Minimally important difference is 5 units  CAT: CAT can identify the health impairment of COPD patients and is better correlated with disease progression.  CAT has a scoring range of zero to 40. The CAT score is classified into four groups of low (less than 10), medium (10 - 20), high (21-30) and very high (31-40) based on the impact level of disease on health status. A CAT score over 10 suggests significant symptoms.  A worsening CAT score could be explained by an exacerbation, poor medication adherence, poor inhaler technique, or progression of COPD or comorbid conditions.  CAT MCID is 2 points  mMRC: mMRC (Modified Medical Research Council) Dyspnea Scale is used to assess the degree of baseline functional disability in patients of respiratory disease due to dyspnea. No minimal important difference is established. A decrease in score of 1 point or greater is considered a positive change.   Pulmonary Function Assessment:  Pulmonary Function Assessment - 08/22/19 1623      Breath   Bilateral Breath Sounds Rales   Rales throughout right lungand left with rales i/2 way up on the left side   Shortness of Breath Yes;Limiting activity           Exercise Target Goals: Exercise Program Goal: Individual exercise prescription set using results from initial 6 min walk test and THRR while considering  patient's activity barriers and safety.   Exercise Prescription Goal: Initial exercise prescription builds to 30-45 minutes a day of aerobic activity, 2-3 days per week.  Home exercise guidelines will be given  to patient during program as part of exercise prescription that the participant will acknowledge.  Activity Barriers & Risk Stratification:  Activity Barriers & Cardiac Risk Stratification - 08/22/19 1455      Activity Barriers & Cardiac Risk Stratification   Activity Barriers Arthritis;Neck/Spine Problems;Joint Problems;Deconditioning;Muscular Weakness;Shortness of Breath;History of Falls    Cardiac Risk Stratification High           6 Minute Walk:  6 Minute Walk    Row Name 08/22/19 1605         6 Minute Walk   Phase Initial     Distance 1334 feet     Walk Time 6 minutes     # of Rest Breaks 0     MPH 2.53     METS 2.56     RPE 13     Perceived Dyspnea  1     VO2 Peak 8.96     Symptoms Yes (comment)  Right Hip and Knee hurt when walking due to arthritis     Resting HR 68 bpm     Resting BP 106/64     Resting Oxygen Saturation  98 %     Exercise Oxygen Saturation  during 6 min walk 92 %     Max Ex. HR 93 bpm     Max Ex. BP 136/60     2 Minute Post BP 136/64       Interval HR   1 Minute HR 92     2 Minute HR 90     3 Minute HR 93     4 Minute HR 84     5 Minute HR 87     6 Minute HR 89     2 Minute Post HR 67     Interval Heart Rate? Yes       Interval Oxygen   Interval Oxygen? Yes     Baseline Oxygen Saturation % 98 %     1 Minute Oxygen Saturation % 94 %     1 Minute Liters of Oxygen 0 L     2 Minute Oxygen Saturation % 94 %     2 Minute Liters of Oxygen 0 L     3 Minute Oxygen Saturation % 92 %     3 Minute Liters of Oxygen 0 L     4 Minute Oxygen Saturation % 92 %     4 Minute Liters of Oxygen 0 L     5 Minute Oxygen Saturation % 93 %     5 Minute Liters of Oxygen 0 L     6 Minute Oxygen Saturation % 92 %     6 Minute Liters of Oxygen 0 L     2 Minute Post Oxygen Saturation % 98 %     2 Minute Post Liters of Oxygen 0 L  Oxygen Initial Assessment:  Oxygen Initial Assessment - 08/22/19 1604      Home Oxygen   Home Oxygen Device  None    Sleep Oxygen Prescription CPAP    Home Exercise Oxygen Prescription None    Home Resting Oxygen Prescription None    Compliance with Home Oxygen Use Yes      Initial 6 min Walk   Oxygen Used None      Program Oxygen Prescription   Program Oxygen Prescription None      Intervention   Short Term Goals To learn and exhibit compliance with exercise, home and travel O2 prescription;To learn and understand importance of monitoring SPO2 with pulse oximeter and demonstrate accurate use of the pulse oximeter.;To learn and understand importance of maintaining oxygen saturations>88%;To learn and demonstrate proper pursed lip breathing techniques or other breathing techniques.;To learn and demonstrate proper use of respiratory medications    Long  Term Goals Exhibits compliance with exercise, home and travel O2 prescription;Verbalizes importance of monitoring SPO2 with pulse oximeter and return demonstration;Maintenance of O2 saturations>88%;Exhibits proper breathing techniques, such as pursed lip breathing or other method taught during program session;Compliance with respiratory medication;Demonstrates proper use of MDI's           Oxygen Re-Evaluation:  Oxygen Re-Evaluation    Row Name 09/14/19 1641 10/03/19 0717           Program Oxygen Prescription   Program Oxygen Prescription None None        Home Oxygen   Home Oxygen Device None None      Sleep Oxygen Prescription CPAP CPAP      Home Exercise Oxygen Prescription None None      Home Resting Oxygen Prescription None None      Compliance with Home Oxygen Use Yes Yes        Goals/Expected Outcomes   Short Term Goals To learn and exhibit compliance with exercise, home and travel O2 prescription;To learn and understand importance of monitoring SPO2 with pulse oximeter and demonstrate accurate use of the pulse oximeter.;To learn and understand importance of maintaining oxygen saturations>88%;To learn and demonstrate proper pursed  lip breathing techniques or other breathing techniques.;To learn and demonstrate proper use of respiratory medications To learn and exhibit compliance with exercise, home and travel O2 prescription;To learn and understand importance of monitoring SPO2 with pulse oximeter and demonstrate accurate use of the pulse oximeter.;To learn and understand importance of maintaining oxygen saturations>88%;To learn and demonstrate proper pursed lip breathing techniques or other breathing techniques.;To learn and demonstrate proper use of respiratory medications      Long  Term Goals Exhibits compliance with exercise, home and travel O2 prescription;Verbalizes importance of monitoring SPO2 with pulse oximeter and return demonstration;Maintenance of O2 saturations>88%;Exhibits proper breathing techniques, such as pursed lip breathing or other method taught during program session;Compliance with respiratory medication;Demonstrates proper use of MDI's Exhibits compliance with exercise, home and travel O2 prescription;Verbalizes importance of monitoring SPO2 with pulse oximeter and return demonstration;Maintenance of O2 saturations>88%;Exhibits proper breathing techniques, such as pursed lip breathing or other method taught during program session;Compliance with respiratory medication;Demonstrates proper use of MDI's      Goals/Expected Outcomes compliance and understanding compliance and understanding of oxygen saturation and pursed lip breathing             Oxygen Discharge (Final Oxygen Re-Evaluation):  Oxygen Re-Evaluation - 10/03/19 0717      Program Oxygen Prescription   Program Oxygen Prescription None      Home Oxygen  Home Oxygen Device None    Sleep Oxygen Prescription CPAP    Home Exercise Oxygen Prescription None    Home Resting Oxygen Prescription None    Compliance with Home Oxygen Use Yes      Goals/Expected Outcomes   Short Term Goals To learn and exhibit compliance with exercise, home and  travel O2 prescription;To learn and understand importance of monitoring SPO2 with pulse oximeter and demonstrate accurate use of the pulse oximeter.;To learn and understand importance of maintaining oxygen saturations>88%;To learn and demonstrate proper pursed lip breathing techniques or other breathing techniques.;To learn and demonstrate proper use of respiratory medications    Long  Term Goals Exhibits compliance with exercise, home and travel O2 prescription;Verbalizes importance of monitoring SPO2 with pulse oximeter and return demonstration;Maintenance of O2 saturations>88%;Exhibits proper breathing techniques, such as pursed lip breathing or other method taught during program session;Compliance with respiratory medication;Demonstrates proper use of MDI's    Goals/Expected Outcomes compliance and understanding of oxygen saturation and pursed lip breathing           Initial Exercise Prescription:  Initial Exercise Prescription - 08/23/19 1400      Date of Initial Exercise RX and Referring Provider   Date 08/22/19    Referring Provider [name]    Expected Discharge Date 10/19/19      Recumbant Bike   Level 1    RPM 60    Minutes 15      NuStep   Level 2    SPM 80    Minutes 14      Prescription Details   Frequency (times per week) 2    Duration Progress to 30 minutes of continuous aerobic without signs/symptoms of physical distress      Intensity   THRR 40-80% of Max Heartrate 56-112    Ratings of Perceived Exertion 11-13    Perceived Dyspnea 0-4      Progression   Progression Continue to progress workloads to maintain intensity without signs/symptoms of physical distress.      Resistance Training   Training Prescription Yes    Weight orange bands    Reps 10-15           Perform Capillary Blood Glucose checks as needed.  Exercise Prescription Changes:  Exercise Prescription Changes    Row Name 09/07/19 1400 09/12/19 1500 09/26/19 1400 09/28/19 1611        Response to Exercise   Blood Pressure (Admit) -- 104/60 122/68 124/80    Blood Pressure (Exercise) -- 130/60 124/64 --    Blood Pressure (Exit) -- 130/80 134/82 126/66    Heart Rate (Admit) -- 75 bpm 67 bpm 66 bpm    Heart Rate (Exercise) -- 81 bpm 70 bpm 75 bpm    Heart Rate (Exit) -- 77 bpm 61 bpm 66 bpm    Oxygen Saturation (Admit) -- 97 % 97 % 97 %    Oxygen Saturation (Exercise) -- 96 % 96 % 96 %    Oxygen Saturation (Exit) -- 95 % 95 % 99 %    Rating of Perceived Exertion (Exercise) -- _0 Perceived Dyspnea (Exercise) -- 1 0 1    Duration -- Continue with 30 min of aerobic exercise without signs/symptoms of physical distress. Continue with 30 min of aerobic exercise without signs/symptoms of physical distress. Continue with 30 min of aerobic exercise without signs/symptoms of physical distress.    Intensity -- Other (comment)  40-80% of HRR THRR unchanged THRR unchanged  Progression   Progression -- Continue to progress workloads to maintain intensity without signs/symptoms of physical distress. Continue to progress workloads to maintain intensity without signs/symptoms of physical distress. Continue to progress workloads to maintain intensity without signs/symptoms of physical distress.    Average METs -- -- 2.2 2.5      Resistance Training   Training Prescription -- Yes Yes Yes    Weight -- orange bands orange bands Blue bands    Reps -- 10-15 10-15 10-15    Time -- 10 Minutes 10 Minutes --      NuStep   Level -- _0 SPM -- 80 80 80    Minutes -- _1 METs -- 2.2 2.2 2.5      Track   Laps -- 12 -- 12    Minutes -- 15 -- 15    METs -- -- -- 2.39      Home Exercise Plan   Plans to continue exercise at Home (comment) -- -- --    Frequency Add 2 additional days to program exercise sessions. -- -- --    Initial Home Exercises Provided 09/07/19 -- -- --           Exercise Comments:  Exercise Comments    Row Name 08/31/19 1511 09/07/19 1457          Exercise Comments pt completed his first day of exercise with pulmonary rehab. He completed a 5 minute warm-up on the Nustep followed by 15 minutes with no complaints or concerns. He transitioned to the stationary bike for another 15 minutes but complained of pain in both knees after 5 minutes. After today we will keep him on the Nustep for the whole 30 minutes of exercise. We will continue to monitor and progress as able. home exercise complete             Exercise Goals and Review:  Exercise Goals    Row Name 08/23/19 1433             Exercise Goals   Increase Physical Activity Yes       Intervention Provide advice, education, support and counseling about physical activity/exercise needs.;Develop an individualized exercise prescription for aerobic and resistive training based on initial evaluation findings, risk stratification, comorbidities and participant's personal goals.       Expected Outcomes Short Term: Attend rehab on a regular basis to increase amount of physical activity.;Long Term: Add in home exercise to make exercise part of routine and to increase amount of physical activity.;Long Term: Exercising regularly at least 3-5 days a week.       Increase Strength and Stamina Yes       Intervention Provide advice, education, support and counseling about physical activity/exercise needs.;Develop an individualized exercise prescription for aerobic and resistive training based on initial evaluation findings, risk stratification, comorbidities and participant's personal goals.       Expected Outcomes Short Term: Increase workloads from initial exercise prescription for resistance, speed, and METs.;Short Term: Perform resistance training exercises routinely during rehab and add in resistance training at home;Long Term: Improve cardiorespiratory fitness, muscular endurance and strength as measured by increased METs and functional capacity (6MWT)       Able to understand and use rate  of perceived exertion (RPE) scale Yes       Intervention Provide education and explanation on how to use RPE scale       Expected Outcomes Short Term: Able to  use RPE daily in rehab to express subjective intensity level;Long Term:  Able to use RPE to guide intensity level when exercising independently       Able to understand and use Dyspnea scale Yes       Intervention Provide education and explanation on how to use Dyspnea scale       Expected Outcomes Short Term: Able to use Dyspnea scale daily in rehab to express subjective sense of shortness of breath during exertion;Long Term: Able to use Dyspnea scale to guide intensity level when exercising independently       Knowledge and understanding of Target Heart Rate Range (THRR) Yes       Intervention Provide education and explanation of THRR including how the numbers were predicted and where they are located for reference       Expected Outcomes Short Term: Able to state/look up THRR;Long Term: Able to use THRR to govern intensity when exercising independently;Short Term: Able to use daily as guideline for intensity in rehab       Understanding of Exercise Prescription Yes       Intervention Provide education, explanation, and written materials on patient's individual exercise prescription       Expected Outcomes Short Term: Able to explain program exercise prescription;Long Term: Able to explain home exercise prescription to exercise independently              Exercise Goals Re-Evaluation :  Exercise Goals Re-Evaluation    Row Name 09/14/19 1641 10/03/19 0714           Exercise Goal Re-Evaluation   Exercise Goals Review Increase Physical Activity;Increase Strength and Stamina;Able to understand and use rate of perceived exertion (RPE) scale;Able to understand and use Dyspnea scale;Knowledge and understanding of Target Heart Rate Range (THRR);Able to check pulse independently;Understanding of Exercise Prescription;Improve claudication pain  tolerance and improve walking ability Increase Physical Activity;Increase Strength and Stamina;Able to understand and use rate of perceived exertion (RPE) scale;Able to understand and use Dyspnea scale;Knowledge and understanding of Target Heart Rate Range (THRR);Understanding of Exercise Prescription      Comments Pt has completed 5 exercise sessions with pulmonary rehab. We started him out exercising on the Recumbent bike for 15 minutes but he complained of knee pain. He is now walking the track for 15 minutes and using the Nustep for 15 minutes.He is fairly deconditioned but seems to have a positive outlook. He is currently exercising at 2.2 METs on the Nustep. We will continue to monitor and progress as he is able. pt has completed 9 exercise sessions. Pt still complains of knee pain, due to arthritis, each exercise session. He walks the track for 15 minutes when his knee is not giving him too much pain and also does The Nustep for 15 minutes. He is exercising at 2.5 METS on the Nustep and 2.39 METS when walking the track.      Expected Outcomes Through exercise at rehab and home the patient will decrease shortness of breath with daily activities and feel confident in carrying out an exercise regimn at home Through exercise at rehab and home the patient will decrease shortness of breath with daily activities and feel confident in carrying out an exercise regimn at home             Discharge Exercise Prescription (Final Exercise Prescription Changes):  Exercise Prescription Changes - 09/28/19 1611      Response to Exercise   Blood Pressure (Admit) 124/80    Blood Pressure (  Exit) 126/66    Heart Rate (Admit) 66 bpm    Heart Rate (Exercise) 75 bpm    Heart Rate (Exit) 66 bpm    Oxygen Saturation (Admit) 97 %    Oxygen Saturation (Exercise) 96 %    Oxygen Saturation (Exit) 99 %    Rating of Perceived Exertion (Exercise) 8    Perceived Dyspnea (Exercise) 1    Duration Continue with 30 min of  aerobic exercise without signs/symptoms of physical distress.    Intensity THRR unchanged      Progression   Progression Continue to progress workloads to maintain intensity without signs/symptoms of physical distress.    Average METs 2.5      Resistance Training   Training Prescription Yes    Weight Blue bands    Reps 10-15      NuStep   Level 4    SPM 80    Minutes 15    METs 2.5      Track   Laps 12    Minutes 15    METs 2.39           Nutrition:  Target Goals: Understanding of nutrition guidelines, daily intake of sodium <1565m, cholesterol <2040m calories 30% from fat and 7% or less from saturated fats, daily to have 5 or more servings of fruits and vegetables.  Biometrics:  Pre Biometrics - 08/23/19 1435      Pre Biometrics   Grip Strength 37 kg            Nutrition Therapy Plan and Nutrition Goals:  Nutrition Therapy & Goals - 09/12/19 0746      Nutrition Therapy   Diet Low Sodium/Carb Modified    Drug/Food Interactions Coumadin/Vit K      Personal Nutrition Goals   Nutrition Goal Improved blood glucose control as evidenced by pt's A1c trending from 7.4 toward less than 7.0.    Personal Goal #2 CBG concentrations in the normal range or as close to normal as is safely possible.    Personal Goal #3 Pt to identify and limit food sources of saturated fat, trans fat, refined carbohydrates and sodium      Intervention Plan   Intervention Prescribe, educate and counsel regarding individualized specific dietary modifications aiming towards targeted core components such as weight, hypertension, lipid management, diabetes, heart failure and other comorbidities.    Expected Outcomes Short Term Goal: A plan has been developed with personal nutrition goals set during dietitian appointment.           Nutrition Assessments:  Nutrition Assessments - 09/12/19 0748      Rate Your Plate Scores   Pre Score 62           Nutrition Goals Re-Evaluation:   Nutrition Goals Re-Evaluation    [place] 09/12/19 0747 10/04/19 0837           Goals   Current Weight 185 lb (83.9 kg) 186 lb 8.2 oz (84.6 kg)      Nutrition Goal Improved blood glucose control as evidenced by pt's A1c trending from 7.4 toward less than 7.0. Improved blood glucose control as evidenced by pt's A1c trending from 7.4 toward less than 7.0.        Personal Goal #2 Re-Evaluation   Personal Goal #2 CBG concentrations in the normal range or as close to normal as is safely possible. CBG concentrations in the normal range or as close to normal as is safely possible.  Personal Goal #3 Re-Evaluation   Personal Goal #3 Pt to identify and limit food sources of saturated fat, trans fat, refined carbohydrates and sodium Pt to identify and limit food sources of saturated fat, trans fat, refined carbohydrates and sodium             Nutrition Goals Discharge (Final Nutrition Goals Re-Evaluation):  Nutrition Goals Re-Evaluation - 10/04/19 0837      Goals   Current Weight 186 lb 8.2 oz (84.6 kg)    Nutrition Goal Improved blood glucose control as evidenced by pt's A1c trending from 7.4 toward less than 7.0.      Personal Goal #2 Re-Evaluation   Personal Goal #2 CBG concentrations in the normal range or as close to normal as is safely possible.      Personal Goal #3 Re-Evaluation   Personal Goal #3 Pt to identify and limit food sources of saturated fat, trans fat, refined carbohydrates and sodium           Psychosocial: Target Goals: Acknowledge presence or absence of significant depression and/or stress, maximize coping skills, provide positive support system. Participant is able to verbalize types and ability to use techniques and skills needed for reducing stress and depression.  Initial Review & Psychosocial Screening:  Initial Psych Review & Screening - 08/22/19 1501      Initial Review   Current issues with Current Depression   depression managed well with Zoloft      Family Dynamics   Good Support System? Yes      Barriers   Psychosocial barriers to participate in program There are no identifiable barriers or psychosocial needs.      Screening Interventions   Interventions Encouraged to exercise    Expected Outcomes Short Term goal: Identification and review with participant of any Quality of Life or Depression concerns found by scoring the questionnaire.           Quality of Life Scores:  Scores of 19 and below usually indicate a poorer quality of life in these areas.  A difference of  2-3 points is a clinically meaningful difference.  A difference of 2-3 points in the total score of the Quality of Life Index has been associated with significant improvement in overall quality of life, self-image, physical symptoms, and general health in studies assessing change in quality of life.  PHQ-9: Recent Review Flowsheet Data    Depression screen [facility] 2/9 08/22/2019 08/22/2019 03/27/2019 01/19/2019 12/18/2018   Decreased Interest 0 0 0 0 0   Down, Depressed, Hopeless 0 0 0 0 0   PHQ - 2 Score 0 0 0 0 0   Altered sleeping 0 - - - -   Tired, decreased energy 1  - - - -   Change in appetite 0 - - - -   Feeling bad or failure about yourself  0 - - - -   Trouble concentrating 0 - - - -   Moving slowly or fidgety/restless 0 - - - -   Suicidal thoughts 0 - - - -   PHQ-9 Score 1 - - - -   Difficult doing work/chores Not difficult at all - - - -     Interpretation of Total Score  Total Score Depression Severity:  1-4 = Minimal depression, 5-9 = Mild depression, 10-14 = Moderate depression, 15-19 = Moderately severe depression, 20-27 = Severe depression   Psychosocial Evaluation and Intervention:  Psychosocial Evaluation - 09/13/19 2252      Psychosocial  Evaluation & Interventions   Interventions Stress management education;Relaxation education;Encouraged to exercise with the program and follow exercise prescription    Comments Jibril had history  current depression related to his inability to particpate in activities of choice.    Expected Outcomes Gaetan will display heatlhy coping skills and managment of his current depression.  he will also increase his physical activity as technique for managing his depression.           Psychosocial Re-Evaluation:  Psychosocial Re-Evaluation    [place] Name 09/13/19 2257 09/13/19 2258 09/13/19 2309 10/03/19 1432       Psychosocial Re-Evaluation   Current issues with Current Depression -- -- Current Depression    Comments Kore has a current history of depression and Deunta has a current history of depression and is not on any medications.  Feels he can mangage his blue days on his own Quandre has a current history of depression and is not on any medications.  Feels he can mangage his blue days on his own.  Added track as station of exercise which he enjoyed. Mcguire has a current history of depression and is not on any medications.  Feels he can mangage his blue days on his own. jary enjoys coming to pulmonary rehab and looks forward to the interaction with staff and fellow participants.    Expected Outcomes -- Kastin will continue to to display adequate coping skills for his current depression. Maverick will seek further assistance if he feels he is unable to elevate himself out of his depression. -- Gladys will continue to to display adequate coping skills for his current depression. Sanjiv will seek further assistance if he feels he is unable to elevate himself out of his depression.    Interventions -- Encouraged to attend Pulmonary Rehabilitation for the exercise;Relaxation education;Stress management education -- Encouraged to attend Pulmonary Rehabilitation for the exercise;Relaxation education;Stress management education    Continue Psychosocial Services  -- Follow up required by staff -- Follow up required by counselor           Psychosocial Discharge (Final Psychosocial Re-Evaluation):  Psychosocial  Re-Evaluation - 10/03/19 1432      Psychosocial Re-Evaluation   Current issues with Current Depression    Comments Cameron has a current history of depression and is not on any medications.  Feels he can mangage his blue days on his own. cruise enjoys coming to pulmonary rehab and looks forward to the interaction with staff and fellow participants.    Expected Outcomes Searcy will continue to to display adequate coping skills for his current depression. Nuno will seek further assistance if he feels he is unable to elevate himself out of his depression.    Interventions Encouraged to attend Pulmonary Rehabilitation for the exercise;Relaxation education;Stress management education    Continue Psychosocial Services  Follow up required by counselor           Education: Education Goals: Education classes will be provided on a weekly basis, covering required topics. Participant will state understanding/return demonstration of topics presented.  Learning Barriers/Preferences:  Learning Barriers/Preferences - 08/22/19 1503      Learning Barriers/Preferences   Learning Barriers None    Learning Preferences Skilled Demonstration;Written Material           Education Topics: Risk Factor Reduction:  -Group instruction that is supported by a PowerPoint presentation. Instructor discusses the definition of a risk factor, different risk factors for pulmonary disease, and how the heart and lungs work together.  PULMONARY REHAB OTHER RESPIRATORY from 04/11/2015 in [place]  Date 03/14/15  Educator EP  Instruction Review Code (Retired) 2- meets goals/outcomes      Nutrition for Pulmonary Patient:  -Group instruction provided by [employer], verbal discussion, and written materials to support subject matter. The instructor gives an explanation and review of healthy diet recommendations, which includes a discussion on weight management, recommendations for fruit  and vegetable consumption, as well as protein, fluid, caffeine, fiber, sodium, sugar, and alcohol. Tips for eating when patients are short of breath are discussed.   PULMONARY REHAB OTHER RESPIRATORY from 09/21/2019 in [place]  Date 09/21/19  Educator Handout  [Nutrition by Dietician]      Pursed Lip Breathing:  -Group instruction that is supported by demonstration and informational handouts. Instructor discusses the benefits of pursed lip and diaphragmatic breathing and detailed demonstration on how to preform both.     Oxygen Safety:  -Group instruction provided by PowerPoint, verbal discussion, and written material to support subject matter. There is an overview of "What is Oxygen" and "Why do we need it".  Instructor also reviews how to create a safe environment for oxygen use, the importance of using oxygen as prescribed, and the risks of noncompliance. There is a brief discussion on traveling with oxygen and resources the patient may utilize.   PULMONARY REHAB OTHER RESPIRATORY from 04/11/2015 in [place]  Date 04/11/15  Educator Trish Fountain  Instruction Review Code (Retired) 2- meets goals/outcomes      Oxygen Equipment:  -Group instruction provided by [employer], Network engineer, and Insurance underwriter.   Signs and Symptoms:  -Group instruction provided by written material and verbal discussion to support subject matter. Warning signs and symptoms of infection, stroke, and heart attack are reviewed and when to call the physician/911 reinforced. Tips for preventing the spread of infection discussed.   Advanced Directives:  -Group instruction provided by verbal instruction and written material to support subject matter. Instructor reviews Advanced Directive laws and proper instruction for filling out document.   Pulmonary Video:  -Group video education that reviews the  importance of medication and oxygen compliance, exercise, good nutrition, pulmonary hygiene, and pursed lip and diaphragmatic breathing for the pulmonary patient.   Exercise for the Pulmonary Patient:  -Group instruction that is supported by a PowerPoint presentation. Instructor discusses benefits of exercise, core components of exercise, frequency, duration, and intensity of an exercise routine, importance of utilizing pulse oximetry during exercise, safety while exercising, and options of places to exercise outside of rehab.     Pulmonary Medications:  -Verbally interactive group education provided by instructor with focus on inhaled medications and proper administration.   PULMONARY REHAB OTHER RESPIRATORY from 04/11/2015 in [place]  Date 04/04/15  Educator Pharm D  Instruction Review Code (Retired) 2- Lawyer and Physiology of the Respiratory System and Intimacy:  -Group instruction provided by PowerPoint, verbal discussion, and written material to support subject matter. Instructor reviews respiratory cycle and anatomical components of the respiratory system and their functions. Instructor also reviews differences in obstructive and restrictive respiratory diseases with examples of each. Intimacy, Sex, and Sexuality differences are reviewed with a discussion on how relationships can change when diagnosed with pulmonary disease. Common sexual concerns are reviewed.   MD DAY -A group question and answer session with a medical doctor that allows  participants to ask questions that relate to their pulmonary disease state.   OTHER EDUCATION -Group or individual verbal, written, or video instructions that support the educational goals of the pulmonary rehab program.   PULMONARY REHAB OTHER RESPIRATORY from 09/21/2019 in [place]  Date 09/12/19  Educator Handout  [METS]      Holiday Eating  Survival Tips:  -Group instruction provided by PowerPoint slides, verbal discussion, and written materials to support subject matter. The instructor gives patients tips, tricks, and techniques to help them not only survive but enjoy the holidays despite the onslaught of food that accompanies the holidays.   Knowledge Questionnaire Score:  Knowledge Questionnaire Score - 08/22/19 1550      Knowledge Questionnaire Score   Pre Score 15/18           Core Components/Risk Factors/Patient Goals at Admission:  Personal Goals and Risk Factors at Admission - 08/22/19 1504      Core Components/Risk Factors/Patient Goals on Admission   Improve shortness of breath with ADL's Yes    Intervention Provide education, individualized exercise plan and daily activity instruction to help decrease symptoms of SOB with activities of daily living.    Expected Outcomes Short Term: Improve cardiorespiratory fitness to achieve a reduction of symptoms when performing ADLs;Long Term: Be able to perform more ADLs without symptoms or delay the onset of symptoms    Diabetes Yes    Intervention Provide education about signs/symptoms and action to take for hypo/hyperglycemia.;Provide education about proper nutrition, including hydration, and aerobic/resistive exercise prescription along with prescribed medications to achieve blood glucose in normal ranges: Fasting glucose 65-99 mg/dL    Expected Outcomes Long Term: Attainment of HbA1C < 7%.;Short Term: Participant verbalizes understanding of the signs/symptoms and immediate care of hyper/hypoglycemia, proper foot care and importance of medication, aerobic/resistive exercise and nutrition plan for blood glucose control.    Heart Failure Yes    Intervention Provide a combined exercise and nutrition program that is supplemented with education, support and counseling about heart failure. Directed toward relieving symptoms such as shortness of breath, decreased exercise  tolerance, and extremity edema.    Expected Outcomes Improve functional capacity of life;Short term: Attendance in program 2-3 days a week with increased exercise capacity. Reported lower sodium intake. Reported increased fruit and vegetable intake. Reports medication compliance.;Short term: Daily weights obtained and reported for increase. Utilizing diuretic protocols set by physician.;Long term: Adoption of self-care skills and reduction of barriers for early signs and symptoms recognition and intervention leading to self-care maintenance.           Core Components/Risk Factors/Patient Goals Review:   Goals and Risk Factor Review    Row Name 09/13/19 2300 10/03/19 1435           Core Components/Risk Factors/Patient Goals Review   Personal Goals Review Improve shortness of breath with ADL's;Diabetes;Increase knowledge of respiratory medications and ability to use respiratory devices properly.;Heart Failure;Develop more efficient breathing techniques such as purse lipped breathing and diaphragmatic breathing and practicing self-pacing with activity. Improve shortness of breath with ADL's;Diabetes;Increase knowledge of respiratory medications and ability to use respiratory devices properly.;Heart Failure;Develop more efficient breathing techniques such as purse lipped breathing and diaphragmatic breathing and practicing self-pacing with activity.      Review lydon has completed 4 exercise sessions.  pt is able to recall techniques he learned from previous participation. Pt is observed using these techniques independently.  Continue to monitor.  Jagger has adequate blood glucose pre  and post exercise.  Eytan CHF is managed by Heart failure clinic and has diuretic prn medication that he demonstrate when to use the prn medicaiton, weigs daily and avoids high sodium foods. Avanish has completed 9 exercise sessions.[name]  Marcella has participated in both cardiac and pulmonary rehab.  He  is able to recall  techniques he learned from previous participation. Pt is observed using these techniques independently.  Continue to monitor.  Maxamillion has adequate blood glucose pre and post exercise.  Jasiyah CHF is managed by Heart failure clinic. Pt has fluctuations with his weight depending on when he takes his weekly diuretic.  presently per his wights her , he is up 7.48 pounds but wonder if the admission weight of 81.2 is correct as the other weights are in the 83-85kg range      Expected Outcomes See Admission Goals See Admission Goals             Core Components/Risk Factors/Patient Goals at Discharge (Final Review):   Goals and Risk Factor Review - 10/03/19 1435      Core Components/Risk Factors/Patient Goals Review   Personal Goals Review Improve shortness of breath with ADL's;Diabetes;Increase knowledge of respiratory medications and ability to use respiratory devices properly.;Heart Failure;Develop more efficient breathing techniques such as purse lipped breathing and diaphragmatic breathing and practicing self-pacing with activity.    Review Angas has completed 9 exercise sessions.[name]  Yahir has participated in both cardiac and pulmonary rehab.  He  is able to recall techniques he learned from previous participation. Pt is observed using these techniques independently.  Continue to monitor.  Nilay has adequate blood glucose pre and post exercise.  Angelica CHF is managed by Heart failure clinic. Pt has fluctuations with his weight depending on when he takes his weekly diuretic.  presently per his wights her , he is up 7.48 pounds but wonder if the admission weight of 81.2 is correct as the other weights are in the 83-85kg range    Expected Outcomes See Admission Goals           ITP Comments:   Comments:  Nettie has completed  9 exercise sessions in Pulmonary rehab. Pt maintains good attendance and makes attempts to engage in  consistent home exercise. Pulmonary rehab staff will  continue to monitor and  reassess progress toward goals during her participation in Pulmonary Rehab. Cherre Huger, BSN Cardiac and Training and development officer

## 2019-10-09 ENCOUNTER — Encounter: Payer: Self-pay | Admitting: Adult Health Nurse Practitioner

## 2019-10-10 ENCOUNTER — Encounter (HOSPITAL_COMMUNITY)
Admission: RE | Admit: 2019-10-10 | Discharge: 2019-10-10 | Disposition: A | Discharge status: Home / Self Care | Payer: Medicare Other | Source: Ambulatory Visit | Attending: Internal Medicine | Admitting: Internal Medicine

## 2019-10-10 ENCOUNTER — Encounter: Payer: Self-pay | Admitting: Internal Medicine

## 2019-10-10 ENCOUNTER — Other Ambulatory Visit: Payer: Self-pay

## 2019-10-10 DIAGNOSIS — I5032 Chronic diastolic (congestive) heart failure: Secondary | ICD-10-CM | POA: Insufficient documentation

## 2019-10-10 NOTE — Progress Notes (Signed)
Annual  Screening/Preventative Visit  & Comprehensive Evaluation & Examination      This very nice 79 y.o.  MWM  presents for a Screening /Preventative Visit & comprehensive evaluation and management of multiple medical co-morbidities.  Patient has been followed for HTN, HLD, T2_NIDDM  and Vitamin D Deficiency. Patient is also followed by Dr Irene Limbo  And had iron infusion for Chronic Anemia.       HTN predates since  74. Patient's BP has been controlled at home.  Today's BP is at goal -  118/64. In 2018, patient underwent a CABG & MAZE procedure and also has has RFA's for AFib /Flutter.  He's been on Coumadin  For chronic Afib and is also followed in the [facility] Failure Clinic by Dr [name].  Patient also has Pulmonary Hypertension attributed to his COPD & Restrictive Lung Disease. Patient has DOE, but denies any recent CP, palpitations, dizziness or ankle swelling.        Patient's hyperlipidemia is controlled with diet and Atorvastatin. Patient denies myalgias or other medication SE's. Last lipids were at goal except elevated Trig's:  Lab Results  Component Value Date   CHOL 163 03/28/2019   HDL 31 (L) 03/28/2019   LDLCALC 95 03/28/2019   TRIG 257 (H) 03/28/2019   CHOLHDL 5.3 (H) 03/28/2019       Patient has hx/o T2_NIDDM (1995) /CKD 4and patient denies reactive hypoglycemic symptoms, visual blurring, diabetic polys or paresthesias.  Patient is on bid dosing of Novolin 70/30. Last A1c was not at goal:  Lab Results  Component Value Date   HGBA1C 7.4 (H) 03/28/2019        Finally, patient has history of Vitamin D Deficiency  ("39" /2008) and last vitamin D was not at goal (70-100):  Lab Results  Component Value Date   VD25OH 53 03/28/2019    Current Outpatient Medications on File Prior to Visit  Medication Sig  . acetaminophen (TYLENOL) 500 MG tablet Take 1,000 mg by mouth every 6 (six) hours as needed for moderate pain or headache.   . allopurinol (ZYLOPRIM) 300 MG tablet  Take 150 mg by mouth daily.  [name] ALPRAZolam (XANAX) 1 MG tablet Take 0.5 tablets (0.5 mg total) by mouth at bedtime as needed for sleep.  [name] Alum Hydroxide-Mag Carbonate (GAVISCON PO) Take 4 tablets by mouth daily as needed (acid reflux).   [name] aspirin EC 81 MG tablet Take 81 mg by mouth daily.  [name] atorvastatin (LIPITOR) 20 MG tablet Take 1 tablet 3 x /week (Patient taking differently: Take 20 mg by mouth 3 (three) times a week. Take 1 tablet 3 x /week)  . azelastine (ASTELIN) 0.1 % nasal spray Place 2 sprays into both nostrils 2 (two) times daily as needed for rhinitis or allergies.  . B Complex Vitamins (VITAMIN B COMPLEX PO) Take 1 tablet by mouth at bedtime.   . bacitracin 500 UNIT/GM ointment Apply 1 application topically daily as needed for wound care.  . Blood Glucose Monitoring Suppl (ACCU-CHEK AVIVA PLUS) w/Device KIT Check blood sugar 1 time  daily  . Blood Glucose Monitoring Suppl DEVI Test blood sugar up to three times a day or as directed.  . carvedilol (COREG) 6.25 MG tablet Take 0.5 tablets (3.125 mg total) by mouth in the morning AND 1 tablet (6.25 mg total) every evening.  . Cholecalciferol (VITAMIN D PO) Take 5,000 Units by mouth daily.   . Continuous Blood Gluc Receiver (FREESTYLE LIBRE 14 DAY READER) DEVI 1 Device by  Does not apply route 3 (three) times daily. Dx: E11.22, N18.4, Z79.4  . Continuous Blood Gluc Sensor (FREESTYLE LIBRE 14 DAY SENSOR) MISC Check blood sugar 3 times a day. Dx: E11.22, N18.4, Z79.4  . diphenhydrAMINE (BENADRYL) 25 MG tablet Take 25 mg by mouth at bedtime as needed for allergies.   . ferrous sulfate 325 (65 FE) MG EC tablet Take 325 mg by mouth 3 (three) times a week. Take with vitamin C   . fluticasone (FLONASE) 50 MCG/ACT nasal spray Place 1 spray into both nostrils daily as needed for allergies.  [name] glucose blood (ACCU-CHEK AVIVA PLUS) test strip CHECK BLOOD GLUCOSE 3 TIMES DAILY.  [name] insulin NPH-regular Human (70-30) 100 UNIT/ML injection Inject 25-45  Units into the skin See admin instructions. 45 units in the morning and 30 units in the evening  . lidocaine (LIDODERM) 5 % Place 1 patch onto the skin daily as needed (for pain). Remove & Discard patch within 12 hours or as directed by MD   . loratadine (CLARITIN) 10 MG tablet Take 10 mg by mouth daily as needed for allergies.   . metolazone (ZAROXOLYN) 2.5 MG tablet TAKE 1 TABLET BY MOUTH  DAILY AS NEEDED FOR  SWELLING (Patient taking differently: Take 2.5 mg by mouth daily as needed (swelling). )  . Multiple Vitamins-Minerals (PRESERVISION AREDS 2) CAPS Take 1 capsule by mouth 2 (two) times daily.   . polyvinyl alcohol (ARTIFICIAL TEARS) 1.4 % ophthalmic solution Place 1 drop into both eyes daily as needed for dry eyes.  . potassium chloride SA (KLOR-CON) 20 MEQ tablet Take 3 tablets (60 mEq total) by mouth in the morning AND 2 tablets (40 mEq total) daily after lunch AND 2 tablets (40 mEq total) every evening.  . sertraline (ZOLOFT) 100 MG tablet Take 1 tablet Daily for Mood (Patient taking differently: Take 100 mg by mouth daily. Take 1 tablet Daily for Mood)  . torsemide (DEMADEX) 100 MG tablet Take 150 mg by mouth daily.  [name] warfarin (COUMADIN) 5 MG tablet Take 5-7.5 mg by mouth See admin instructions. 1.5 tablets 4 days a week. Alternating with 1 tablets on MWF (total 9 tabs/week)    No current facility-administered medications on file prior to visit.   Allergies  Allergen Reactions  . Sunflower Seed [Sunflower Oil] Swelling and Other (See Comments)    Tongue and lip swelling  . Horse-Derived Products Other (See Comments)    Per allergy skin test UNSPECIFIED REACTION   . Tetanus Toxoids Other (See Comments)    Per allergy skin test  . Tetanus Toxoid     Other reaction(s): Other (See Comments) Rash(horse serum)   Past Medical History:  Diagnosis Date  . Adrenal adenoma   . Anemia   . Anxiety   . Arthritis   . Atypical atrial flutter ([place]) 8/15, 10/15   a. DCCV 08/2013. b. s/p  RFA 10/2013.  [name] Basal cell carcinoma   . CAD (coronary artery disease)    a. 04/2013 CABG x 2: LIMA to LAD, SVG to RI, EVH via R thigh.  . Cellulitis 12/2015   left leg  . Chronic diastolic congestive heart failure ([place])   . CKD (chronic kidney disease), stage III ([place])   . Depression   . Diabetes mellitus type II   . Diverticulosis 2001  . DJD (degenerative joint disease)   . GERD (gastroesophageal reflux disease)   . Gout   . H/O hiatal hernia   . History of cardioversion  x3 (years uncertain)  . Hx of adenomatous colonic polyps   . Hyperlipidemia   . Hypertension   . Hypertensive cardiomyopathy ([place])   . Hypertensive retinopathy    OU  . Macular degeneration    OU  . Obstructive sleep apnea    compliant with CPAP  . 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 Persistent atrial fibrillation ([place])    a. s/p MAZE 04/2013 in setting of CABG. b. Amio stopped in 10/2013 after flutter ablation.  [name] PFO (patent foramen ovale)    a. Small PFO by TEE 10/2013.  [name] Pleural effusion, left    a. s/p thoracentesis 05/2013.  [name] Respiratory failure ([place])    a. Hypoxia 10/2013 - required supp O2 as inpatient, did not require it at discharge.  . S/P Maze operation for atrial fibrillation    a. 04/2013: Complete bilateral atrial lesion set using cryothermy and bipolar radiofrequency ablation with clipping of LA appendage (@ time of CABG)  . S/P Maze operation for atrial fibrillation 04/05/2013   Complete bilateral atrial lesion set using cryothermy and bipolar radiofrequency ablation with clipping of LA appendage via median sternotomy approach    Health Maintenance  Topic Date Due  . INFLUENZA VACCINE  08/06/2019  . FOOT EXAM  09/08/2019  . URINE MICROALBUMIN  09/08/2019  . HEMOGLOBIN A1C  09/28/2019  . OPHTHALMOLOGY EXAM  09/30/2019  . TETANUS/TDAP  07/05/2025  . COVID-19 Vaccine   Completed  . PNA vac Low Risk Adult  Completed   Immunization History  Administered Date(s) Administered  . DT (Pediatric) 08/05/2015  . Influenza Split 10/25/2012  . Influenza, High Dose Seasonal PF 09/26/2013, 09/27/2014, 10/03/2015, 10/05/2016, 11/11/2017, 10/10/2018  . PFIZER SARS-COV-2 Vaccination 01/26/2019, 02/15/2019  . Pneumococcal Conjugate-13 01/30/2014  . Pneumococcal Polysaccharide-23 10/20/2011  . Td 01/06/2000   Last Colon -   Past Surgical History:  Procedure Laterality Date  . APPENDECTOMY  03/05/2017   laproscopic  . ATRIAL FIBRILLATION ABLATION N/A 10/26/2013   Procedure: ATRIAL FIBRILLATION ABLATION;  Surgeon: Coralyn Mark, MD;  Location: [place] CATH LAB;  Service: Cardiovascular;  Laterality: N/A;  . BASAL CELL CARCINOMA EXCISION     x3 on face  . CARDIAC CATHETERIZATION     myocardial bridge but no cad  . CARDIOVERSION N/A 08/23/2013   Procedure: CARDIOVERSION;  Surgeon: Sanda Klein, MD;  Location: [facility];  Service: Cardiovascular;  Laterality: N/A;  . CARPOMETACARPEL SUSPENSION PLASTY Left 02/14/2014   Procedure: CARPOMETACARPEL ([place]) SUSPENSIONPLASTY THUMB  WITH  ABDUCTOR POLLICIS LONGUS TRANSFER AND STENOSING TENOSYNOVITIS RELEASE LEFT WRIST;  Surgeon: Charlotte Crumb, MD;  Location: [facility];  Service: Orthopedics;  Laterality: Left;  . CATARACT EXTRACTION Bilateral   . COLONOSCOPY WITH PROPOFOL N/A 01/26/2017   Procedure: COLONOSCOPY WITH PROPOFOL;  Surgeon: Doran Stabler, MD;  Location: [facility];  Service: Gastroenterology;  Laterality: N/A;  . CORONARY ARTERY BYPASS GRAFT N/A 04/05/2013   Procedure: CORONARY ARTERY BYPASS GRAFTING (CABG) TIMES TWO USING LEFT INTERNAL MAMMARY ARTERY AND RIGHT SAPHENOUS LEG VEIN HARVESTED ENDOSCOPICALLY;  Surgeon: Rexene Alberts, MD;  Location: [facility];  Service: Open Heart Surgery;  Laterality: N/A;  . ESOPHAGOGASTRODUODENOSCOPY (EGD) WITH PROPOFOL N/A 01/26/2017   Procedure:  ESOPHAGOGASTRODUODENOSCOPY (EGD) WITH PROPOFOL;  Surgeon: Doran Stabler, MD;  Location: [facility];  Service: Gastroenterology;  Laterality: N/A;  . EYE SURGERY Bilateral    Cat Sx  .  EYE SURGERY Right    RD repair - SB  . GREAT TOE ARTHRODESIS, INTERPHALANGEAL JOINT     Right foot  . INTRAOPERATIVE TRANSESOPHAGEAL ECHOCARDIOGRAM N/A 04/05/2013   Procedure: INTRAOPERATIVE TRANSESOPHAGEAL ECHOCARDIOGRAM;  Surgeon: Rexene Alberts, MD;  Location: [place] [facility];  Service: Open Heart Surgery;  Laterality: N/A;  . LAPAROSCOPIC APPENDECTOMY N/A 03/05/2017   Procedure: APPENDECTOMY LAPAROSCOPIC;  Surgeon: Judeth Horn, MD;  Location: [place] [facility];  Service: General;  Laterality: N/A;  . LEFT HEART CATHETERIZATION WITH CORONARY ANGIOGRAM N/A 03/07/2013   Procedure: LEFT HEART CATHETERIZATION WITH CORONARY ANGIOGRAM;  Surgeon: Burnell Blanks, MD;  Location[facility] CATH LAB;  Service: Cardiovascular;  Laterality: N/A;  . MAZE N/A 04/05/2013   Procedure: MAZE;  Surgeon: Rexene Alberts, MD;  Location: [place];  Service: Open Heart Surgery;  Laterality: N/A;  . Polinydal cyst     Removed  . POLYPECTOMY    . Retina repair-right    . RETINAL DETACHMENT SURGERY Right    RD Repair - SB  . RIGHT HEART CATH N/A 07/06/2019   Procedure: RIGHT HEART CATH;  Surgeon: Jolaine Artist, MD;  Location: [facility] CV LAB;  Service: Cardiovascular;  Laterality: N/A;  . RIGHT HEART CATHETERIZATION N/A 05/03/2014   Procedure: RIGHT HEART CATH;  Surgeon: Jolaine Artist, MD;  Location: [facility] CATH LAB;  Service: Cardiovascular;  Laterality: N/A;  . TEE WITHOUT CARDIOVERSION N/A 08/23/2013   Procedure: TRANSESOPHAGEAL ECHOCARDIOGRAM (TEE);  Surgeon: Sanda Klein, MD;  Location: [facility] ENDOSCOPY;  Service: Cardiovascular;  Laterality: N/A;  . TEE WITHOUT CARDIOVERSION N/A 10/26/2013   Procedure: TRANSESOPHAGEAL ECHOCARDIOGRAM (TEE);  Surgeon: Sueanne Margarita, MD;  Location: [facility] ENDOSCOPY;  Service: Cardiovascular;  Laterality: N/A;  . TEE  WITHOUT CARDIOVERSION N/A 06/05/2014   Procedure: TRANSESOPHAGEAL ECHOCARDIOGRAM (TEE);  Surgeon: Thayer Headings, MD;  Location: [facility] ENDOSCOPY;  Service: Cardiovascular;  Laterality: N/A;  . TRAPEZIUM RESECTION     Family History  Problem Relation Age of Onset  . Dementia Father   . Colon cancer Mother        Family History/Uncle   . Colon polyps Mother        Family History  . Atrial fibrillation Mother   . Hypertension Mother   . Colon polyps Sister        Family history  . Diabetes Maternal Uncle   . Stroke Paternal Uncle    Social History   Socioeconomic History  . Marital status: Married    Spouse name: Not on file  . Number of children: 1  Occupational History  . Occupation: retired Software engineer  Tobacco Use  . Smoking status: Former Smoker    Packs/day: 4.00    Years: 25.00    Pack years: 100.00    Types: Cigarettes    Quit date: 01/05/1981    Years since quitting: 38.7  . Smokeless tobacco: Never Used  Vaping Use  . Vaping Use: Never used  Substance and Sexual Activity  . Alcohol use: Yes    Alcohol/week: 1.0 standard drink    Types: 1 Shots of liquor per week    Comment: 1-5 drinks per week  . Drug use: No  . Sexual activity: Not on file  Social History Narrative   Daily caffeine-yes   Patient gets regular exercise.     ROS Constitutional: Denies fever, chills, weight loss/gain, headaches, insomnia,  night sweats or change in appetite. Does c/o fatigue. Eyes: Denies redness, blurred vision, diplopia, discharge, itchy or watery eyes.  ENT: Denies discharge, congestion, post nasal drip, epistaxis, sore throat, earache, hearing loss, dental pain, Tinnitus, Vertigo, Sinus pain or snoring.  Cardio: Denies chest pain, palpitations, irregular heartbeat, syncope, dyspnea, diaphoresis, orthopnea, PND, claudication or edema Respiratory: denies cough, dyspnea, DOE, pleurisy, hoarseness, laryngitis or wheezing.  Gastrointestinal: Denies dysphagia, heartburn, reflux,  water brash, pain, cramps, nausea, vomiting, bloating, diarrhea, constipation, hematemesis, melena, hematochezia, jaundice or hemorrhoids Genitourinary: Denies dysuria, frequency, urgency, nocturia, hesitancy, discharge, hematuria or flank pain Musculoskeletal: Denies arthralgia, myalgia, stiffness, Jt. Swelling, pain, limp or strain/sprain. Denies Falls. Skin: Denies puritis, rash, hives, warts, acne, eczema or change in skin lesion Neuro: No weakness, tremor, incoordination, spasms, paresthesia or pain Psychiatric: Denies confusion, memory loss or sensory loss. Denies Depression. Endocrine: Denies change in weight, skin, hair change, nocturia, and paresthesia, diabetic polys, visual blurring or hyper / hypo glycemic episodes.  Heme/Lymph: No excessive bleeding, bruising or enlarged lymph nodes.  Physical Exam  BP 118/64   Pulse 85   Temp (!) 97 F (36.1 C)   Resp 16   Ht _0  (1.803 m)   Wt 183 lb 6.4 oz (83.2 kg)   SpO2 97%   BMI 25.58 kg/m   General Appearance: Well nourished and well groomed and in no apparent distress.  Eyes: PERRLA, EOMs, conjunctiva no swelling or erythema, normal fundi and vessels. Sinuses: No frontal/maxillary tenderness ENT/Mouth: EACs patent / TMs  nl. Nares clear without erythema, swelling, mucoid exudates. Oral hygiene is good. No erythema, swelling, or exudate. Tongue normal, non-obstructing. Tonsils not swollen or erythematous. Hearing normal.  Neck: Supple, thyroid not palpable. No bruits, nodes or JVD. Respiratory: Respiratory effort normal.  BS equal and clear bilateral without rales, rhonci, wheezing or stridor. Cardio: Heart sounds are soft / distant with regular rate and rhythm and a sys  murmur. Peripheral pulses are 1+ and equal bilaterally without edema. No aortic or femoral bruits. Chest: symmetric with normal excursions and percussion.  Abdomen: Soft, with Nl bowel sounds. Nontender, no guarding, rebound, hernias, masses, or organomegaly.    Lymphatics: Non tender without lymphadenopathy.  Musculoskeletal: Full ROM all peripheral extremities, joint stability, 5/5 strength, and normal gait. Skin: Warm and dry without rashes, lesions, cyanosis, clubbing or  ecchymosis.  Neuro: Cranial nerves intact, reflexes equal bilaterally. Normal muscle tone, no cerebellar symptoms. Sensation intact.  Pysch: Alert and oriented X 3 with normal affect, insight and judgment appropriate.   Assessment and Plan  1. Annual Preventative/Screening Exam    2. Essential hypertension  - EKG 12-Lead - [place], RETROPERITNL ABD,  LTD - CBC with Differential/Platelet - COMPLETE METABOLIC PANEL WITH GFR - Magnesium - TSH - Microalbumin / creatinine urine ratio  3. Hyperlipidemia associated with type 2 diabetes mellitus ([place])  - EKG 12-Lead - [place], RETROPERITNL ABD,  LTD - Lipid panel - TSH  4. Type 2 diabetes mellitus with stage 4 chronic kidney  disease, with long-term current use of insulin (HCC)  - EKG 12-Lead - [place], RETROPERITNL ABD,  LTD - PTH, intact and calcium - Hemoglobin A1c - HM DIABETES FOOT EXAM - LOW EXTREMITY NEUR EXAM DOCUM - Microalbumin / creatinine urine ratio  5. Vitamin D deficiency  - VITAMIN D 25 Hydroxy   6. PAF (paroxysmal atrial fibrillation) (HCC)  - EKG 12-Lead - Protime-INR  7. Chronic gouty arthropathy  - Uric acid  8. Secondary hyperparathyroidism, renal (HCC)  - PTH, intact and calcium  9. BPH with obstruction/lower urinary tract symptoms  - PSA  10. S/P CABG x  2 and maze procedure- April 2015  - EKG 12-Lead - Lipid panel  11. Chronic diastolic congestive heart failure (HCC)  - EKG 12-Lead  12. OSA on CPAP   13. Prostate cancer screening - PSA  14. Screening for ischemic heart disease  - EKG 12-Lead  15. Screening for colorectal cancer  - POC Hemoccult Bld/Stl   16. FHx: heart disease  - EKG 12-Lead - [place], RETROPERITNL ABD,  LTD  17. Former smoker  - EKG 12-Lead - [place],  RETROPERITNL ABD,  LTD  18. Screening for AAA (aortic abdominal aneurysm)  - [place], RETROPERITNL ABD,  LTD  19. Encounter for monitoring Coumadin therapy  - Protime-INR  20. Medication management  - Urinalysis, Routine w reflex microscopic - CBC with Differential/Platelet - COMPLETE METABOLIC PANEL WITH GFR - Magnesium - Lipid panel - TSH - Hemoglobin A1c - VITAMIN D 25 Hydroxy - Protime-INR - Uric acid - Microalbumin / creatinine urine ratio           Patient was counseled in prudent diet, weight control to achieve/maintain BMI less than 25, BP monitoring, regular exercise and medications as discussed.  Discussed med effects and SE's. Routine screening labs and tests as requested with regular follow-up as recommended. Over 40 minutes of exam, counseling, chart review and high complex critical decision making was performed   Kirtland Bouchard, MD

## 2019-10-10 NOTE — Patient Instructions (Signed)

## 2019-10-10 NOTE — Progress Notes (Signed)
Daily Session Note  Patient Details  Name: [name]. MRN: [id number] Date of Birth: 03-24-1940 Referring Provider:     Pulmonary Rehab Walk Test from 08/22/2019 in [place]  Referring Provider [name]      Encounter Date: 10/10/2019  Check In:  Session Check In - 10/10/19 1337      Check-In   Supervising physician immediately available to respond to emergencies [facility] immediately available    Physician(s) Dr. Doristine Bosworth    Location [facility]    Staff Present Maurice Small, RN, Bjorn Loser, MS, CEP, Exercise Physiologist;Lisa Jani Gravel, MS, ACSM-CEP, Exercise Physiologist    Virtual Visit No    Medication changes reported     No    Fall or balance concerns reported    No    Tobacco Cessation No Change    Warm-up and Cool-down Performed on first and last piece of equipment    Resistance Training Performed Yes    VAD Patient? No    PAD/SET Patient? No      Pain Assessment   Currently in Pain? No/denies    Pain Score 0-No pain    Multiple Pain Sites No           Capillary Blood Glucose: No results found for this or any previous visit (from the past 24 hour(s)).    Social History   Tobacco Use  Smoking Status Former Smoker  . Packs/day: 4.00  . Years: 25.00  . Pack years: 100.00  . Types: Cigarettes  . Quit date: 01/05/1981  . Years since quitting: 38.7  Smokeless Tobacco Never Used    Goals Met:  Independence with exercise equipment Exercise tolerated well No report of cardiac concerns or symptoms Strength training completed today  Goals Unmet:  Not Applicable  Comments: Service time is from 1308 to 1400    Dr. Fransico Him is Medical Director for Cardiac Rehab at [facility].

## 2019-10-11 ENCOUNTER — Ambulatory Visit (INDEPENDENT_AMBULATORY_CARE_PROVIDER_SITE_OTHER): Payer: Medicare Other | Admitting: Internal Medicine

## 2019-10-11 VITALS — BP 118/64 | HR 85 | Temp 97.0°F | Resp 16 | Ht 71.0 in | Wt 183.4 lb

## 2019-10-11 DIAGNOSIS — I480 Paroxysmal atrial fibrillation: Secondary | ICD-10-CM

## 2019-10-11 DIAGNOSIS — Z87891 Personal history of nicotine dependence: Secondary | ICD-10-CM

## 2019-10-11 DIAGNOSIS — Z5181 Encounter for therapeutic drug level monitoring: Secondary | ICD-10-CM

## 2019-10-11 DIAGNOSIS — E1122 Type 2 diabetes mellitus with diabetic chronic kidney disease: Secondary | ICD-10-CM

## 2019-10-11 DIAGNOSIS — I5032 Chronic diastolic (congestive) heart failure: Secondary | ICD-10-CM

## 2019-10-11 DIAGNOSIS — N184 Chronic kidney disease, stage 4 (severe): Secondary | ICD-10-CM | POA: Diagnosis not present

## 2019-10-11 DIAGNOSIS — Z23 Encounter for immunization: Secondary | ICD-10-CM

## 2019-10-11 DIAGNOSIS — N2581 Secondary hyperparathyroidism of renal origin: Secondary | ICD-10-CM | POA: Diagnosis not present

## 2019-10-11 DIAGNOSIS — M1A00X0 Idiopathic chronic gout, unspecified site, without tophus (tophi): Secondary | ICD-10-CM

## 2019-10-11 DIAGNOSIS — Z125 Encounter for screening for malignant neoplasm of prostate: Secondary | ICD-10-CM

## 2019-10-11 DIAGNOSIS — Z8249 Family history of ischemic heart disease and other diseases of the circulatory system: Secondary | ICD-10-CM | POA: Diagnosis not present

## 2019-10-11 DIAGNOSIS — Z0000 Encounter for general adult medical examination without abnormal findings: Secondary | ICD-10-CM

## 2019-10-11 DIAGNOSIS — G4733 Obstructive sleep apnea (adult) (pediatric): Secondary | ICD-10-CM

## 2019-10-11 DIAGNOSIS — E559 Vitamin D deficiency, unspecified: Secondary | ICD-10-CM

## 2019-10-11 DIAGNOSIS — N138 Other obstructive and reflux uropathy: Secondary | ICD-10-CM

## 2019-10-11 DIAGNOSIS — Z0001 Encounter for general adult medical examination with abnormal findings: Secondary | ICD-10-CM

## 2019-10-11 DIAGNOSIS — Z136 Encounter for screening for cardiovascular disorders: Secondary | ICD-10-CM

## 2019-10-11 DIAGNOSIS — E785 Hyperlipidemia, unspecified: Secondary | ICD-10-CM

## 2019-10-11 DIAGNOSIS — Z79899 Other long term (current) drug therapy: Secondary | ICD-10-CM

## 2019-10-11 DIAGNOSIS — Z951 Presence of aortocoronary bypass graft: Secondary | ICD-10-CM

## 2019-10-11 DIAGNOSIS — I10 Essential (primary) hypertension: Secondary | ICD-10-CM

## 2019-10-11 DIAGNOSIS — E1169 Type 2 diabetes mellitus with other specified complication: Secondary | ICD-10-CM | POA: Diagnosis not present

## 2019-10-11 DIAGNOSIS — Z1212 Encounter for screening for malignant neoplasm of rectum: Secondary | ICD-10-CM

## 2019-10-12 ENCOUNTER — Other Ambulatory Visit: Payer: Self-pay

## 2019-10-12 ENCOUNTER — Encounter (HOSPITAL_COMMUNITY)
Admission: RE | Admit: 2019-10-12 | Discharge: 2019-10-12 | Disposition: A | Discharge status: Home / Self Care | Payer: Medicare Other | Source: Ambulatory Visit | Attending: Internal Medicine | Admitting: Internal Medicine

## 2019-10-12 DIAGNOSIS — I5032 Chronic diastolic (congestive) heart failure: Secondary | ICD-10-CM

## 2019-10-12 LAB — CBC WITH DIFFERENTIAL/PLATELET
Absolute Monocytes: 562 cells/uL (ref 200–950)
Basophils Absolute: 52 cells/uL (ref 0–200)
Basophils Relative: 0.7 %
Eosinophils Absolute: 178 cells/uL (ref 15–500)
Eosinophils Relative: 2.4 %
HCT: 39.6 % (ref 38.5–50.0)
Hemoglobin: 13 g/dL — ABNORMAL LOW (ref 13.2–17.1)
Lymphs Abs: 651 cells/uL — ABNORMAL LOW (ref 850–3900)
MCH: 31.5 pg (ref 27.0–33.0)
MCHC: 32.8 g/dL (ref 32.0–36.0)
MCV: 95.9 fL (ref 80.0–100.0)
MPV: 9 fL (ref 7.5–12.5)
Monocytes Relative: 7.6 %
Neutro Abs: 5957 cells/uL (ref 1500–7800)
Neutrophils Relative %: 80.5 %
Platelets: 198 10*3/uL (ref 140–400)
RBC: 4.13 10*6/uL — ABNORMAL LOW (ref 4.20–5.80)
RDW: 15.8 % — ABNORMAL HIGH (ref 11.0–15.0)
Total Lymphocyte: 8.8 %
WBC: 7.4 10*3/uL (ref 3.8–10.8)

## 2019-10-12 LAB — URINALYSIS, ROUTINE W REFLEX MICROSCOPIC
Bacteria, UA: NONE SEEN /HPF
Bilirubin Urine: NEGATIVE
Glucose, UA: NEGATIVE
Hgb urine dipstick: NEGATIVE
Hyaline Cast: NONE SEEN /LPF
Ketones, ur: NEGATIVE
Leukocytes,Ua: NEGATIVE
Nitrite: NEGATIVE
Protein, ur: NEGATIVE
RBC / HPF: NONE SEEN /HPF (ref 0–2)
Specific Gravity, Urine: 1.008 (ref 1.001–1.03)
Squamous Epithelial / HPF: NONE SEEN /HPF (ref <=5)
WBC, UA: NONE SEEN /HPF (ref 0–5)
pH: 6.5 (ref 5.0–8.0)

## 2019-10-12 LAB — COMPLETE METABOLIC PANEL WITH GFR
AG Ratio: 1.6 (calc) (ref 1.0–2.5)
ALT: 23 U/L (ref 9–46)
AST: 26 U/L (ref 10–35)
Albumin: 4.2 g/dL (ref 3.6–5.1)
Alkaline phosphatase (APISO): 113 U/L (ref 35–144)
BUN/Creatinine Ratio: 20 (calc) (ref 6–22)
BUN: 39 mg/dL — ABNORMAL HIGH (ref 7–25)
CO2: 34 mmol/L — ABNORMAL HIGH (ref 20–32)
Calcium: 9.3 mg/dL (ref 8.6–10.3)
Chloride: 103 mmol/L (ref 98–110)
Creat: 1.93 mg/dL — ABNORMAL HIGH (ref 0.70–1.11)
GFR, Est African American: 37 mL/min/{1.73_m2} — ABNORMAL LOW (ref >=60)
GFR, Est Non African American: 32 mL/min/{1.73_m2} — ABNORMAL LOW (ref >=60)
Globulin: 2.7 g/dL (calc) (ref 1.9–3.7)
Glucose, Bld: 146 mg/dL — ABNORMAL HIGH (ref 65–99)
Potassium: 4 mmol/L (ref 3.5–5.3)
Sodium: 143 mmol/L (ref 135–146)
Total Bilirubin: 0.6 mg/dL (ref 0.2–1.2)
Total Protein: 6.9 g/dL (ref 6.1–8.1)

## 2019-10-12 LAB — PTH, INTACT AND CALCIUM
Calcium: 9.3 mg/dL (ref 8.6–10.3)
PTH: 118 pg/mL — ABNORMAL HIGH (ref 14–64)

## 2019-10-12 LAB — MICROALBUMIN / CREATININE URINE RATIO
Creatinine, Urine: 33 mg/dL (ref 20–320)
Microalb Creat Ratio: 167 mcg/mg creat — ABNORMAL HIGH (ref <30)
Microalb, Ur: 5.5 mg/dL

## 2019-10-12 LAB — PROTIME-INR
INR: 1.4 — ABNORMAL HIGH
Prothrombin Time: 15.2 s — ABNORMAL HIGH (ref 9.0–11.5)

## 2019-10-12 LAB — LIPID PANEL
Cholesterol: 152 mg/dL (ref <200)
HDL: 28 mg/dL — ABNORMAL LOW (ref >=40)
LDL Cholesterol (Calc): 94 mg/dL (calc)
Non-HDL Cholesterol (Calc): 124 mg/dL (calc) (ref <130)
Total CHOL/HDL Ratio: 5.4 (calc) — ABNORMAL HIGH (ref <5.0)
Triglycerides: 198 mg/dL — ABNORMAL HIGH (ref <150)

## 2019-10-12 LAB — MAGNESIUM: Magnesium: 2.5 mg/dL (ref 1.5–2.5)

## 2019-10-12 LAB — VITAMIN D 25 HYDROXY (VIT D DEFICIENCY, FRACTURES): Vit D, 25-Hydroxy: 62 ng/mL (ref 30–100)

## 2019-10-12 LAB — TSH: TSH: 2.29 mIU/L (ref 0.40–4.50)

## 2019-10-12 LAB — PSA: PSA: 1.46 ng/mL (ref <=4.0)

## 2019-10-12 LAB — URIC ACID: Uric Acid, Serum: 6.4 mg/dL (ref 4.0–8.0)

## 2019-10-12 LAB — HEMOGLOBIN A1C
Hgb A1c MFr Bld: 7.7 % of total Hgb — ABNORMAL HIGH (ref <5.7)
Mean Plasma Glucose: 174 (calc)
eAG (mmol/L): 9.7 (calc)

## 2019-10-12 NOTE — Progress Notes (Signed)
Nutrition Note   Post exercise hypoglycemia. Pre exercise CBG: 170 mg/dl Post exercise CBG 58 mg/dl Treated with 20 g CHO. 15 minutes later CBG 69 mg/dl. Treated with 20 g CHO. 15 minutes later CBG 85 mg/dl Pt instructed to eat peanut butter crackers on his way out.  He verbalized understanding. He took 45 units NPH 70/30 this morning and did not have any carbs with lunch. He ate only small pork chop and 1/2 tomato.  He verbalized what he should have done - eat fruit, grain, etc.  Provided future recommendations for diet changes for exercise.   Will continue to follow up with pt during pulmonary rehab.   Michaele Offer, MS, RDN, LDN

## 2019-10-12 NOTE — Progress Notes (Signed)
========================================================== °-   Test results slightly outside the reference range are not unusual. If there is anything important, I will review this with you,  otherwise it is considered normal test values.  If you have further questions,  please do not hesitate to contact me at the office or via My Chart.  ========================================================== ==========================================================  -  Protime / INR = 1.4 x - still on the low side   - Currently on 1.5 tabs x 4 days /wk   and 1 tab x 3 days /week = 9 tabs /week  So. . . . . Recommend   - Take 2 tablets  /day for the next 4 days, then on Monday start taking 1.5 tablets every day   - Schedule Nurse visit 2 weeks after that to recheck PT / INR about Oct 25-27  ========================================================== ==========================================================  -  Uric Acid / Gout Test - OK  ==========================================================  - A1c = 7.7% - still too high - Ideal or goal is less than 6.0% - So. . . .   Keep working on the diet  ==========================================================   - Kidney tests - still Stage 3b & fortunately Stable   -  All Else - CBC - Electrolytes - Liver - Magnesium & Thyroid    - all  Normal / OK ====================================================

## 2019-10-12 NOTE — Progress Notes (Signed)
Daily Session Note  Patient Details  Name: [name]. MRN: [id number] Date of Birth: 12/22/1940 Referring Provider:     Pulmonary Rehab Walk Test from 08/22/2019 in [place]  Referring Provider [name]      Encounter Date: 10/12/2019  Check In:  Session Check In - 10/12/19 1412      Check-In   Supervising physician immediately available to respond to emergencies [facility] immediately available    Physician(s) Dr. Rodena Piety    Location [facility]    Staff Present Maurice Small, RN, BSN;Jeanni Allshouse Ysidro Evert, RN;Jessica Hassell Done, MS, ACSM-CEP, Exercise Physiologist    Virtual Visit No    Medication changes reported     No    Fall or balance concerns reported    No    Tobacco Cessation No Change    Warm-up and Cool-down Performed on first and last piece of equipment    Resistance Training Performed Yes    VAD Patient? No    PAD/SET Patient? No      Pain Assessment   Currently in Pain? No/denies    Pain Score 0-No pain    Multiple Pain Sites No           Capillary Blood Glucose: No results found for this or any previous visit (from the past 24 hour(s)).    Social History   Tobacco Use  Smoking Status Former Smoker  . Packs/day: 4.00  . Years: 25.00  . Pack years: 100.00  . Types: Cigarettes  . Quit date: 01/05/1981  . Years since quitting: 38.7  Smokeless Tobacco Never Used    Goals Met:  Exercise tolerated well No report of cardiac concerns or symptoms Strength training completed today  Goals Unmet:  Not Applicable  Comments: Service time is from 1305 to 1449 See note low blood sugar.   Dr. Fransico Him is Medical Director for Cardiac Rehab at [facility].

## 2019-10-12 NOTE — Progress Notes (Signed)
==========================================================  -    PTH level = 118 is elevated  (Normal is less than 64)   - this is a test monitoring a  hormone regulating calcium balance frequently  affected in persons with impaired Kidney functions.  - Will forward to your Kidney specialist - Dr Carolin Sicks  ==========================================================

## 2019-10-17 ENCOUNTER — Encounter (HOSPITAL_COMMUNITY)
Admission: RE | Admit: 2019-10-17 | Discharge: 2019-10-17 | Disposition: A | Discharge status: Home / Self Care | Payer: Medicare Other | Source: Ambulatory Visit | Attending: Internal Medicine | Admitting: Internal Medicine

## 2019-10-17 ENCOUNTER — Other Ambulatory Visit: Payer: Self-pay

## 2019-10-17 VITALS — Wt 181.9 lb

## 2019-10-17 DIAGNOSIS — I5032 Chronic diastolic (congestive) heart failure: Secondary | ICD-10-CM | POA: Diagnosis not present

## 2019-10-17 NOTE — Progress Notes (Signed)
Daily Session Note  Patient Details  Name: [name]. MRN: [id number] Date of Birth: [birth year] Referring Provider:     Pulmonary Rehab Walk Test from 08/22/2019 in [facility]  Referring Provider [name]      Encounter Date: 10/17/2019  Check In:  Session Check In - 10/17/19 1346      Check-In   Supervising physician immediately available to respond to emergencies [facility] immediately available    Physician(s) Dr. Rodena Piety    Location [facility]    Staff Present Maurice Small, RN, BSN;Lisa Ysidro Evert, RN;David Burbank, MS, EP-C, CCRP;Jessica Hassell Done, MS, ACSM-CEP, Exercise Physiologist    Virtual Visit No    Medication changes reported     No    Fall or balance concerns reported    No    Tobacco Cessation No Change    Warm-up and Cool-down Performed on first and last piece of equipment    Resistance Training Performed Yes    VAD Patient? No    PAD/SET Patient? No      Pain Assessment   Currently in Pain? No/denies    Pain Score 0-No pain    Multiple Pain Sites No           Capillary Blood Glucose: No results found for this or any previous visit (from the past 24 hour(s)).  POCT Glucose - 10/17/19 1519      POCT Blood Glucose   Pre-Exercise #2 143 mg/dL    Post-Exercise #2 116 mg/dL           Exercise Prescription Changes - 10/17/19 1500      Response to Exercise   Blood Pressure (Admit) 118/70    Blood Pressure (Exercise) 112/70    Blood Pressure (Exit) 130/78    Heart Rate (Admit) 71 bpm    Heart Rate (Exercise) 75 bpm    Heart Rate (Exit) 67 bpm    Oxygen Saturation (Admit) 95 %    Oxygen Saturation (Exercise) 95 %    Oxygen Saturation (Exit) 98 %    Rating of Perceived Exertion (Exercise) 11    Perceived Dyspnea (Exercise) 0    Duration Continue with 30 min of aerobic exercise without signs/symptoms of physical distress.    Intensity THRR unchanged      Progression   Progression  Continue to progress workloads to maintain intensity without signs/symptoms of physical distress.    Average METs 2.4      Resistance Training   Training Prescription Yes    Weight Blue bands    Reps 10-15    Time 10 Minutes      NuStep   Level 4    SPM 80    Minutes 25    METs 2.4      Track   Laps 10    Minutes 10           Social History   Tobacco Use  Smoking Status Former Smoker  . Packs/day: 4.00  . Years: 25.00  . Pack years: 100.00  . Types: Cigarettes  . Quit date: 01/05/1981  . Years since quitting: 38.8  Smokeless Tobacco Never Used    Goals Met:  Proper associated with RPD/PD & O2 Sat Independence with exercise equipment Exercise tolerated well No report of cardiac concerns or symptoms Strength training completed today  Goals Unmet:  Not Applicable  Comments: Service time is from 1315 to 1418    Dr. Fransico Him is Medical Director  for Cardiac Rehab at [facility].

## 2019-10-19 ENCOUNTER — Encounter (HOSPITAL_COMMUNITY)
Admission: RE | Admit: 2019-10-19 | Discharge: 2019-10-19 | Disposition: A | Discharge status: Home / Self Care | Payer: Medicare Other | Source: Ambulatory Visit | Attending: Internal Medicine | Admitting: Internal Medicine

## 2019-10-19 ENCOUNTER — Other Ambulatory Visit: Payer: Self-pay

## 2019-10-19 DIAGNOSIS — I5032 Chronic diastolic (congestive) heart failure: Secondary | ICD-10-CM

## 2019-10-19 NOTE — Progress Notes (Signed)
Daily Session Note  Patient Details  Name: [name]. MRN: [id number] Date of Birth: January 25, 1940 Referring Provider:     Pulmonary Rehab Walk Test from 08/22/2019 in [facility]  Referring Provider [name]      Encounter Date: 10/19/2019  Check In:  Session Check In - 10/19/19 1413      Check-In   Supervising physician immediately available to respond to emergencies [facility] immediately available    Physician(s) Dr. Lonny Prude    Location [facility]    Staff Present Maurice Small, RN, BSN;Lisa Ysidro Evert, RN;David [place], MS, EP-C, CCRP;Radie Berges Hassell Done, MS, ACSM-CEP, Exercise Physiologist    Virtual Visit No    Medication changes reported     No    Fall or balance concerns reported    No    Tobacco Cessation No Change    Warm-up and Cool-down Performed on first and last piece of equipment    Resistance Training Performed Yes    VAD Patient? No    PAD/SET Patient? No      Pain Assessment   Currently in Pain? No/denies    Pain Score 0-No pain    Multiple Pain Sites No           Capillary Blood Glucose: No results found for this or any previous visit (from the past 24 hour(s)).    Social History   Tobacco Use  Smoking Status Former Smoker  . Packs/day: 4.00  . Years: 25.00  . Pack years: 100.00  . Types: Cigarettes  . Quit date: 01/05/1981  . Years since quitting: 38.8  Smokeless Tobacco Never Used    Goals Met:  Proper associated with RPD/PD & O2 Sat Exercise tolerated well No report of cardiac concerns or symptoms Strength training completed today  Goals Unmet:  Not Applicable  Comments: Service time is from 1305 to 1414    Dr. Fransico Him is Medical Director for Cardiac Rehab at [facility].

## 2019-10-24 ENCOUNTER — Encounter (HOSPITAL_COMMUNITY)
Admission: RE | Admit: 2019-10-24 | Discharge: 2019-10-24 | Disposition: A | Discharge status: Home / Self Care | Payer: Medicare Other | Source: Ambulatory Visit | Attending: Internal Medicine | Admitting: Internal Medicine

## 2019-10-24 ENCOUNTER — Other Ambulatory Visit: Payer: Self-pay

## 2019-10-24 DIAGNOSIS — I5032 Chronic diastolic (congestive) heart failure: Secondary | ICD-10-CM

## 2019-10-24 NOTE — Progress Notes (Signed)
Daily Session Note  Patient Details  Name: [name]. MRN: [id number] Date of Birth: Oct 03, 1940 Referring Provider:     Pulmonary Rehab Walk Test from 08/22/2019 in [facility]  Referring Provider [name]      Encounter Date: 10/24/2019  Check In:  Session Check In - 10/24/19 1411      Check-In   Supervising physician immediately available to respond to emergencies [facility] immediately available    Physician(s) Dr. Lonny Prude    [facility]    Staff Present Maurice Small, RN, BSN;Abeera Flannery Ysidro Evert, RN;Jessica Hassell Done, MS, ACSM-CEP, Exercise Physiologist    Virtual Visit No    Medication changes reported     No    Fall or balance concerns reported    No    Tobacco Cessation No Change    Warm-up and Cool-down Performed on first and last piece of equipment    Resistance Training Performed Yes    VAD Patient? No    PAD/SET Patient? No      Pain Assessment   Currently in Pain? No/denies    Pain Score 0-No pain    Multiple Pain Sites No           Capillary Blood Glucose: No results found for this or any previous visit (from the past 24 hour(s)).    Social History   Tobacco Use  Smoking Status Former Smoker  . Packs/day: 4.00  . Years: 25.00  . Pack years: 100.00  . Types: Cigarettes  . Quit date: 01/05/1981  . Years since quitting: 38.8  Smokeless Tobacco Never Used    Goals Met:  Exercise tolerated well No report of cardiac concerns or symptoms Strength training completed today  Goals Unmet:  Not Applicable  Comments: Service time is from 1314 to 85    Dr. Fransico Him is Medical Director for Cardiac Rehab at [facility].

## 2019-10-26 ENCOUNTER — Other Ambulatory Visit: Payer: Self-pay

## 2019-10-26 ENCOUNTER — Encounter (HOSPITAL_COMMUNITY)
Admission: RE | Admit: 2019-10-26 | Discharge: 2019-10-26 | Disposition: A | Discharge status: Home / Self Care | Payer: Medicare Other | Source: Ambulatory Visit | Attending: Internal Medicine | Admitting: Internal Medicine

## 2019-10-26 DIAGNOSIS — Z1211 Encounter for screening for malignant neoplasm of colon: Secondary | ICD-10-CM | POA: Diagnosis not present

## 2019-10-26 DIAGNOSIS — Z1212 Encounter for screening for malignant neoplasm of rectum: Secondary | ICD-10-CM | POA: Diagnosis not present

## 2019-10-26 DIAGNOSIS — I5032 Chronic diastolic (congestive) heart failure: Secondary | ICD-10-CM | POA: Diagnosis not present

## 2019-10-27 ENCOUNTER — Other Ambulatory Visit: Payer: Self-pay

## 2019-10-27 DIAGNOSIS — Z1211 Encounter for screening for malignant neoplasm of colon: Secondary | ICD-10-CM

## 2019-10-27 LAB — POC HEMOCCULT BLD/STL (HOME/3-CARD/SCREEN)
Card #2 Fecal Occult Blod, POC: POSITIVE
Card #3 Fecal Occult Blood, POC: POSITIVE
Fecal Occult Blood, POC: POSITIVE — AB

## 2019-10-28 ENCOUNTER — Other Ambulatory Visit: Payer: Self-pay | Admitting: Internal Medicine

## 2019-10-28 DIAGNOSIS — E611 Iron deficiency: Secondary | ICD-10-CM

## 2019-10-28 NOTE — Progress Notes (Signed)
-   Fortunately CBC is Normal -No Anemia    - will request an appointment with Dr [name] again

## 2019-11-01 ENCOUNTER — Other Ambulatory Visit: Payer: Self-pay | Admitting: Adult Health

## 2019-11-01 ENCOUNTER — Ambulatory Visit (INDEPENDENT_AMBULATORY_CARE_PROVIDER_SITE_OTHER): Payer: Medicare Other

## 2019-11-01 ENCOUNTER — Other Ambulatory Visit: Payer: Self-pay

## 2019-11-01 ENCOUNTER — Ambulatory Visit: Payer: Medicare Other | Admitting: Gastroenterology

## 2019-11-01 ENCOUNTER — Encounter: Payer: Self-pay | Admitting: Gastroenterology

## 2019-11-01 VITALS — BP 108/70 | HR 80 | Ht 71.0 in | Wt 183.0 lb

## 2019-11-01 VITALS — BP 140/90 | HR 82 | Temp 97.5°F | Wt 183.0 lb

## 2019-11-01 DIAGNOSIS — R195 Other fecal abnormalities: Secondary | ICD-10-CM

## 2019-11-01 DIAGNOSIS — D509 Iron deficiency anemia, unspecified: Secondary | ICD-10-CM

## 2019-11-01 DIAGNOSIS — D6869 Other thrombophilia: Secondary | ICD-10-CM

## 2019-11-01 DIAGNOSIS — D631 Anemia in chronic kidney disease: Secondary | ICD-10-CM | POA: Diagnosis not present

## 2019-11-01 DIAGNOSIS — Z7901 Long term (current) use of anticoagulants: Secondary | ICD-10-CM

## 2019-11-01 DIAGNOSIS — N189 Chronic kidney disease, unspecified: Secondary | ICD-10-CM

## 2019-11-01 DIAGNOSIS — I480 Paroxysmal atrial fibrillation: Secondary | ICD-10-CM | POA: Diagnosis not present

## 2019-11-01 DIAGNOSIS — D500 Iron deficiency anemia secondary to blood loss (chronic): Secondary | ICD-10-CM

## 2019-11-01 NOTE — Progress Notes (Signed)
[name] Progress Note  Chief Complaint: Heme positive stool  Subjective  History: Seen early 2019 for IDA, felt most likely to be from CKD. 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   Colonoscopy with polyp partially visible within the appendix, subsequently had surgery March 2019, tubular adenoma. Follows with Dr. Irene Limbo of hematology for close monitoring of hemoglobin and periodic IV iron along with oral iron therapy. He has been getting regular Hemoccult checks with primary care for colon cancer screening, and recent stool test positive.  [name] was seen at the request of Dr. Melford [name] for recently detected heme positive stool. He has been on oral iron therapy until he stopped about 2 weeks ago because of chronic constipation.  See below for regular follow-up with cardiology and hematology. [name] denies change in bowel habits, rectal bleeding, dysphagia odynophagia nausea or vomiting.  His stools were dark and sometimes black on oral iron, but that stopped after discontinuing it.  ROS: Cardiovascular:  no chest pain Respiratory: + Dyspnea with exertion, somewhat improved recently after pulmonary rehab. Right hip pain, mostly with ambulation.   Remainder of systems negative except as above  The patient's Past Medical, Family and Social History were reviewed and are on file in the EMR.  From 07/03/2019 cardiology clinic note (before cardiac cath):  "In 3/16 began to develop severe SOB. Started O2. Says his symptoms got worse almost overnight. 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 He has seen Dr. Roxy Manns but felt to have no optimal surgical options for repair. His case was also presented to Dr. Michaelle Birks at [facility] who agreed that there was no way to baffle or reroute the anomalous PV flow to the LA. He had a TEE which showed LVEF 60-65% with a dilated right side  and a small PFO. He has also been seen by Dr. Lake Bells who performed PFTs that showed significant restrictive lung disease with a low DLCO. He had f/u with Dr. Gilles Chiquito in the [facility] who felt his symptoms were multifactorial "  Anemic at that time with hemoglobin 9.5.  Objective:  Med list reviewed  Current Outpatient Medications:  .  acetaminophen (TYLENOL) 500 MG tablet, Take 1,000 mg by mouth every 6 (six) hours as needed for moderate pain or headache. , Disp: , Rfl:  .  allopurinol (ZYLOPRIM) 300 MG tablet, Take 150 mg by mouth daily., Disp: , Rfl:  .  ALPRAZolam (XANAX) 1 MG tablet, Take 0.5 tablets (0.5 mg total) by mouth at bedtime as needed for sleep., Disp: 60 tablet, Rfl: 0 .  Alum Hydroxide-Mag Carbonate (GAVISCON PO), Take 4 tablets by mouth daily as needed (acid reflux). , Disp: , Rfl:  .  aspirin EC 81 MG tablet, Take 81 mg by mouth daily., Disp: , Rfl:  .  atorvastatin (LIPITOR) 20 MG tablet, Take 1 tablet 3 x /week (Patient taking differently: Take 20 mg by mouth 3 (three) times a week. Take 1 tablet 3 x /week), Disp: 36 tablet, Rfl: 3 .  azelastine (ASTELIN) 0.1 % nasal spray, Place 2 sprays into both nostrils 2 (two) times daily as needed for rhinitis or allergies., Disp: , Rfl:  .  B Complex Vitamins (VITAMIN B COMPLEX PO), Take 1 tablet by mouth at bedtime. , Disp: , Rfl:  .  bacitracin 500 UNIT/GM ointment, Apply 1 application topically daily as needed for  wound care., Disp: , Rfl:  .  Blood Glucose Monitoring Suppl (ACCU-CHEK AVIVA PLUS) w/Device KIT, Check blood sugar 1 time  daily, Disp: 1 kit, Rfl: 0 .  Blood Glucose Monitoring Suppl DEVI, Test blood sugar up to three times a day or as directed., Disp: 1 each, Rfl: 0 .  carvedilol (COREG) 6.25 MG tablet, Take 0.5 tablets (3.125 mg total) by mouth in the morning AND 1 tablet (6.25 mg total) every evening., Disp: 270 tablet, Rfl: 1 .  Cholecalciferol (VITAMIN D PO), Take 5,000 Units by mouth daily. , Disp: , Rfl:  .   Continuous Blood Gluc Receiver (FREESTYLE LIBRE 14 DAY READER) DEVI, 1 Device by Does not apply route 3 (three) times daily. Dx: E11.22, N18.4, Z79.4, Disp: 1 each, Rfl: 0 .  Continuous Blood Gluc Sensor (FREESTYLE LIBRE 14 DAY SENSOR) MISC, Check blood sugar 3 times a day. Dx: E11.22, N18.4, Z79.4, Disp: 2 each, Rfl: 12 .  diphenhydrAMINE (BENADRYL) 25 MG tablet, Take 25 mg by mouth at bedtime as needed for allergies. , Disp: , Rfl:  .  fluticasone (FLONASE) 50 MCG/ACT nasal spray, Place 1 spray into both nostrils daily as needed for allergies., Disp: 16 g, Rfl: 3 .  glucose blood (ACCU-CHEK AVIVA PLUS) test strip, CHECK BLOOD GLUCOSE 3 TIMES DAILY., Disp: 300 strip, Rfl: 3 .  insulin NPH-regular Human (70-30) 100 UNIT/ML injection, Inject 25-45 Units into the skin See admin instructions. 45 units in the morning and 30 units in the evening, Disp: , Rfl:  .  lidocaine (LIDODERM) 5 %, Place 1 patch onto the skin daily as needed (for pain). Remove & Discard patch within 12 hours or as directed by MD , Disp: , Rfl:  .  loratadine (CLARITIN) 10 MG tablet, Take 10 mg by mouth daily as needed for allergies. , Disp: , Rfl:  .  metolazone (ZAROXOLYN) 2.5 MG tablet, TAKE 1 TABLET BY MOUTH  DAILY AS NEEDED FOR  SWELLING (Patient taking differently: Take 2.5 mg by mouth daily as needed (swelling). ), Disp: 60 tablet, Rfl: 0 .  Multiple Vitamins-Minerals (PRESERVISION AREDS 2) CAPS, Take 1 capsule by mouth 2 (two) times daily. , Disp: , Rfl:  .  polyvinyl alcohol (ARTIFICIAL TEARS) 1.4 % ophthalmic solution, Place 1 drop into both eyes daily as needed for dry eyes., Disp: , Rfl:  .  potassium chloride SA (KLOR-CON) 20 MEQ tablet, Take 3 tablets (60 mEq total) by mouth in the morning AND 2 tablets (40 mEq total) daily after lunch AND 2 tablets (40 mEq total) every evening. (Patient taking differently: 3 in the morning and 3 in the evening), Disp: 540 tablet, Rfl: 3 .  sertraline (ZOLOFT) 100 MG tablet, Take 1 tablet  Daily for Mood (Patient taking differently: Take 100 mg by mouth daily. Take 1 tablet Daily for Mood), Disp: 90 tablet, Rfl: 3 .  torsemide (DEMADEX) 100 MG tablet, Take 150 mg by mouth daily., Disp: , Rfl:  .  warfarin (COUMADIN) 5 MG tablet, Take 5-7.5 mg by mouth See admin instructions. 1.5 tablets 4 days a week. Alternating with 1 tablets on MWF (total 9 tabs/week) , Disp: , Rfl:    Vital signs in last 24 hrs: Vitals:   11/01/19 0929  BP: 108/70  Pulse: 80  SpO2: 95%    Physical Exam  Ambulatory, gets on exam table slowly but without assistance, alert conversational and pleasant  HEENT: sclera anicteric, oral mucosa moist without lesions  Neck: supple, no thyromegaly, + JVD  Cardiac: RRR without murmurs,, occasional premature beats, S1S2 heard, trace peripheral edema  Pulm: clear to auscultation bilaterally, normal RR and effort noted  Abdomen: soft, no tenderness, with active bowel sounds. No guarding or palpable hepatosplenomegaly.  Skin; warm and dry, no jaundice or rash  Labs:  CBC Latest Ref Rng & Units 10/11/2019 10/03/2019 09/13/2019  WBC 3.8 - 10.8 Thousand/uL 7.4 8.4 8.0  Hemoglobin 13.2 - 17.1 g/dL 13.0(L) 12.7(L) 12.5(L)  Hematocrit 38 - 50 % 39.6 39.6 39.6  Platelets 140 - 400 Thousand/uL 198 178 163   Iron/TIBC/Ferritin/ %Sat    Component Value Date/Time   IRON 80 09/13/2019 0950   TIBC 273 09/13/2019 0950   FERRITIN 173 09/13/2019 0950   IRONPCTSAT 29 09/13/2019 0950   IRONPCTSAT 22 05/01/2019 1517    ___________________________________________ Radiologic studies:   ____________________________________________ Other: Right heart catheterization 07/06/2019  Findings:   RA = 12 RV = 64/10 PA = 64/17 (32)   PCW = 18 (v=31) Fick cardiac output/index = 4.7/2.3 PVR = 2.2 WU Ao sat = 98% PA sat = 65%, 67% SVC sat = 52% Qp/Qs = 1.4   Assessment:   1. 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 PCWP 18 with  v-wave 31   _____________________________________________ Assessment & Plan  Assessment: Encounter Diagnoses  Name Primary?  . Heme positive stool Yes  . Anemia in chronic kidney disease, unspecified CKD stage   . Chronic blood loss anemia   . Long term current use of anticoagulant therapy    I believe the majority of his IDA remains from chronic kidney disease.  He is recently found to be heme positive on routine screening check.  (Incidentally, he is up-to-date on colon cancer screening in past screening/polyp surveillance age). This indicates he has an element of chronic occult [name] blood loss as well, most likely recurrent gastric or small bowel AVM(s). Even if that is the case, iron levels and hemoglobin remain normal on adequate supplementation.    Risks of endoscopy related sedation are high for this patient given his cardiopulmonary condition, and I would not advocate procedures at this point.  As long as his iron levels and hemoglobin can be maintained in safe range, then risks of procedures would outweigh expected benefits.  He needs close follow-up with primary care and hematology to check blood counts and iron levels regularly, especially because he recently discontinued oral iron.  He will therefore likely need IV iron periodically. Reading cardiology notes, his dyspnea was clearly worsened when he was anemic several months ago.  If [name] develops melena or IDA despite adequate IV iron supplementation, then upper endoscopy may be necessary.  If so, would be increased risk procedure, need to stop Coumadin several days prior, hospital outpatient endoscopy procedure due to his cardiopulmonary condition.  Thanks for asking me to reevaluate him.  30 minutes were spent on this encounter (including chart review, history/exam, counseling/coordination of care, and documentation)  [name]

## 2019-11-01 NOTE — Patient Instructions (Signed)
If you are age 80 or older, your body mass index should be between 23-30. Your Body mass index is 25.52 kg/m. If this is out of the aforementioned range listed, please consider follow up with your Primary Care Provider.  If you are age 73 or younger, your body mass index should be between 19-25. Your Body mass index is 25.52 kg/m. If this is out of the aformentioned range listed, please consider follow up with your Primary Care Provider.    It was a pleasure to see you today!  Dr. [name]

## 2019-11-01 NOTE — Addendum Note (Signed)
Addended by: Chancy Hurter on: 11/01/2019 12:19 PM   Modules accepted: Orders

## 2019-11-01 NOTE — Progress Notes (Signed)
Patient presents to the office for a nurse visit to have labs done. PT/INR checked today. Patient is currently taking 7.5 mg daily. Vitals checked and recorded.

## 2019-11-02 LAB — PROTIME-INR
INR: 2.2 — ABNORMAL HIGH
Prothrombin Time: 23.1 s — ABNORMAL HIGH (ref 9.0–11.5)

## 2019-11-13 ENCOUNTER — Ambulatory Visit (INDEPENDENT_AMBULATORY_CARE_PROVIDER_SITE_OTHER): Payer: Medicare Other | Admitting: Adult Health Nurse Practitioner

## 2019-11-13 ENCOUNTER — Encounter: Payer: Self-pay | Admitting: Adult Health Nurse Practitioner

## 2019-11-13 ENCOUNTER — Other Ambulatory Visit: Payer: Self-pay

## 2019-11-13 VITALS — BP 118/64 | HR 63 | Temp 97.6°F | Ht 71.0 in | Wt 187.0 lb

## 2019-11-13 DIAGNOSIS — I10 Essential (primary) hypertension: Secondary | ICD-10-CM | POA: Diagnosis not present

## 2019-11-13 DIAGNOSIS — I480 Paroxysmal atrial fibrillation: Secondary | ICD-10-CM

## 2019-11-13 DIAGNOSIS — D692 Other nonthrombocytopenic purpura: Secondary | ICD-10-CM

## 2019-11-13 DIAGNOSIS — Z7901 Long term (current) use of anticoagulants: Secondary | ICD-10-CM

## 2019-11-13 DIAGNOSIS — D6869 Other thrombophilia: Secondary | ICD-10-CM | POA: Diagnosis not present

## 2019-11-13 DIAGNOSIS — Z79899 Other long term (current) drug therapy: Secondary | ICD-10-CM

## 2019-11-13 NOTE — Progress Notes (Signed)
ONE MONTH FOLLOW UP   Assessment and Plan:  PAF (paroxysmal atrial fibrillation) (HCC) Taking Coumadin 1.5 tablets daily in evening Goal 2-2.5 Check INR and will adjust medication according to labs in 4 weeks pending results  Discussed if patient falls to immediately contact office or go to ER. Discussed foods that can increase or decrease Coumadin levels.   Acquired thrombophilia (HCC) No S&S of bleeding Discussed hospital precautions   Chronic diastolic congestive heart failure ([place]) Encouraged daily monitoring of the patient's weight Continue follow up with cardiology Taking torsemide 162m and potassium 236m Coreg 6.2526mhalf tablet BID  Senile purpura (HCC) Discussed process, protect skin, sunscreen  Essential Hypertension Continue current medications: Monitor blood pressure at home; call if consistently over 130/80 Continue DASH diet.   Reminder to go to the ER if any CP, SOB, nausea, dizziness, severe HA, changes vision/speech, left arm numbness and tingling and jaw pain.  Medication management Continued  Contact office with any new or worsening symtpoms   Further disposition pending results of labs. Discussed med's effects and SE's.   Over 20 minutes of face to face interview exam, counseling, chart review, and critical decision making was performed.   Future Appointments  Date Time Provider DepYorkshire2/13/2021  4:30 PM McKUnk PintoD [facility] None  01/19/2020 12:30 PM [name] [facility] None  01/22/2020 11:15 AM CorLiane ComberP [facility] None  03/13/2020 11:30 AM [facility] None  03/13/2020 12:00 PM [facility]  10/23/2020 11:00 AM McKUnk PintoD [facility] None    ------------------------------------------------------------------------------------------------------------------   HPI There were no vitals taken for this visit.  [age]o.male presents for follow up on  coumadin for p. A. fib, CHF, HTN, CKD III/IV.  He is currently going to out patient Pulmonary Rehabilitation is going well.  He reports he wishes they made it a little it harder.   He is giving his morning insulin NPH 70/30 with breakfast, 45 units and 30units in the evening.  His blood glucose has stabilized since last OV. Lowest 75 fasting, highest 120's.   Patient had recent [facility] Dr. [facility] is treating carvedilol 6.25 mg - takes 1/2 tab BID due to hypotension.  Wt Readings from Last 5 Encounters:  11/01/19 183 lb (83 kg)  11/01/19 183 lb (83 kg)  10/17/19 181 lb 14.1 oz (82.5 kg)  10/11/19 183 lb 6.4 oz (83.2 kg)  10/03/19 187 lb 6.4 oz (85 kg)   Lab Results  Component Value Date   CREATININE 1.93 (H) 10/11/2019   BUN 39 (H) 10/11/2019   NA 143 10/11/2019   K 4.0 10/11/2019   CL 103 10/11/2019   CO2 34 (H) 10/11/2019   He has seen Dr. KalIrene Limbor anemia,  He has had iron infusion that helped resolved dizziness.   He is also wearing compression stockings. He is taking iron one tablet daily.  Patient is on Coumadin for PAfib with goal of 2-2.5 related to vitrous hemorrhage, right eye. He has not taken any antibiotics or missed any doses.  Patients last INR was:   Lab Results  Component Value Date   INR 2.2 (H) 11/01/2019   INR 1.4 (H) 10/11/2019   INR 1.7 (H) 10/03/2019     Had remained on iron supplement which he reported caused constipation but has been managing.  No night sweats, no rash. Has had normal phosphorus at nephrology.  Lab Results  Component Value Date   IRON 80  09/13/2019   TIBC 273 09/13/2019   FERRITIN 173 09/13/2019   Lab Results  Component Value Date   GFRNONAA 74 (L) 10/11/2019   Lab Results  Component Value Date   CALCIUM 9.3 10/11/2019   CALCIUM 9.3 10/11/2019   Phosphorous has been normal.  Past Medical History:  Diagnosis Date   Adrenal adenoma    Anemia    Anxiety    Arthritis    Atypical atrial flutter ([place])  8/15, 10/15   a. DCCV 08/2013. b. s/p RFA 10/2013.   Basal cell carcinoma    CAD (coronary artery disease)    a. 04/2013 CABG x 2: LIMA to LAD, SVG to RI, EVH via R thigh.   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 s/p MAZE 04/2013 in setting of CABG. b. Amio stopped in 10/2013 after flutter ablation.   PFO (patent foramen ovale)    a. Small PFO by TEE 10/2013.   Pleural effusion, left    a. s/p thoracentesis 05/2013.   Respiratory failure ([place])    a. Hypoxia 10/2013 - required supp O2 as inpatient, did not require it at discharge.   S/P Maze operation for atrial fibrillation    a. 04/2013: Complete bilateral atrial lesion set using cryothermy and bipolar radiofrequency ablation with clipping of LA appendage (@ time of CABG)   S/P Maze operation for atrial fibrillation 04/05/2013   Complete bilateral atrial lesion set using cryothermy and bipolar radiofrequency ablation with clipping of LA appendage via median sternotomy approach      Allergies  Allergen Reactions   Sunflower Seed [Sunflower Oil] Swelling and Other (See Comments)    Tongue and lip swelling   Horse-Derived Products Other (See Comments)    Per allergy skin  test UNSPECIFIED REACTION    Tetanus Toxoids Other (See Comments)    Per allergy skin test   Tetanus Toxoid     Other reaction(s): Other (See Comments) Rash(horse serum)     Current Outpatient Medications (Endocrine & Metabolic):    insulin NPH-regular Human (70-30) 100 UNIT/ML injection, Inject 25-45 Units into the skin See admin instructions. 45 units in the morning and 30 units in the evening  Current Outpatient Medications (Cardiovascular):    atorvastatin (LIPITOR) 20 MG tablet, Take 1 tablet 3 x /week (Patient taking differently: Take 20 mg by mouth 3 (three) times a week. Take 1 tablet 3 x /week)   carvedilol (COREG) 6.25 MG tablet, Take 0.5 tablets (3.125 mg total) by mouth in the morning AND 1 tablet (6.25 mg total) every evening.   metolazone (ZAROXOLYN) 2.5 MG tablet, TAKE 1 TABLET BY MOUTH  DAILY AS NEEDED FOR  SWELLING (Patient taking differently: Take 2.5 mg by mouth daily as needed (swelling). )   torsemide (DEMADEX) 100 MG tablet, Take 150 mg by mouth daily.  Current Outpatient Medications (Respiratory):    azelastine (ASTELIN) 0.1 % nasal spray, Place 2 sprays into both nostrils 2 (two) times daily as needed for rhinitis or allergies.   diphenhydrAMINE (BENADRYL) 25 MG tablet, Take 25 mg by mouth at bedtime as needed for allergies.    fluticasone (FLONASE) 50 MCG/ACT nasal spray, Place 1 spray into both nostrils daily as needed for allergies.   loratadine (CLARITIN) 10 MG tablet, Take 10 mg by mouth daily as needed for allergies.   Current Outpatient Medications (Analgesics):    acetaminophen (TYLENOL) 500 MG tablet, Take 1,000 mg by mouth every 6 (six) hours as needed for moderate pain or headache.    allopurinol (ZYLOPRIM) 300 MG tablet, Take 150 mg by mouth daily.   aspirin EC 81 MG tablet, Take 81 mg by mouth daily.  Current Outpatient Medications (Hematological):    warfarin (COUMADIN) 5 MG tablet, Take 5-7.5 mg by mouth See admin instructions. 1.5  tablets 4 days a week. Alternating with 1 tablets on MWF (total 9 tabs/week)   Current Outpatient Medications (Other):    ALPRAZolam (XANAX) 1 MG tablet, Take 0.5 tablets (0.5 mg total) by mouth at bedtime as needed for sleep.   Alum Hydroxide-Mag Carbonate (GAVISCON PO), Take 4 tablets by mouth daily as needed (acid reflux).    B Complex Vitamins (VITAMIN B COMPLEX PO), Take 1 tablet by mouth at bedtime.    bacitracin 500 UNIT/GM ointment, Apply 1 application topically daily as needed for wound care.   Blood Glucose Monitoring Suppl (ACCU-CHEK AVIVA PLUS) w/Device KIT, Check blood sugar 1 time  daily   Blood Glucose Monitoring Suppl DEVI, Test blood sugar up to three times a day or as directed.   Cholecalciferol (VITAMIN D PO), Take 5,000 Units by mouth daily.    Continuous Blood Gluc Receiver (FREESTYLE LIBRE 14 DAY READER) DEVI, 1 Device by Does not apply route 3 (three) times daily. Dx: E11.22, N18.4, Z79.4   Continuous Blood Gluc Sensor (FREESTYLE LIBRE 14 DAY SENSOR) MISC, Check blood sugar 3 times a day. Dx: E11.22, N18.4, Z79.4   glucose blood (ACCU-CHEK AVIVA PLUS) test strip, CHECK BLOOD GLUCOSE 3 TIMES DAILY.   lidocaine (LIDODERM) 5 %, Place 1 patch onto the skin daily as needed (for pain). Remove & Discard patch within 12 hours or as directed by MD    Multiple Vitamins-Minerals (PRESERVISION AREDS 2) CAPS, Take 1 capsule by mouth 2 (two) times daily.    polyvinyl alcohol (ARTIFICIAL TEARS) 1.4 % ophthalmic solution, Place 1 drop into both eyes daily as needed for dry eyes.   potassium chloride SA (KLOR-CON) 20 MEQ tablet, Take 3 tablets (60 mEq total) by mouth in the morning AND 2 tablets (40 mEq total) daily after lunch AND 2 tablets (40 mEq total) every evening. (Patient taking differently: 3 in the morning and 3 in the evening)   sertraline (ZOLOFT) 100 MG tablet, Take 1 tablet Daily for Mood (Patient taking differently: Take 100 mg by mouth daily. Take 1 tablet Daily  for Mood)  ROS: Review of Systems  Constitutional: Negative for chills, diaphoresis, fever, malaise/fatigue and weight loss.  HENT: Negative for ear pain, hearing loss and tinnitus.   Eyes: Negative for blurred vision and double vision.  Respiratory: Negative for cough and shortness of breath.  Cardiovascular: Negative for chest pain, palpitations, leg swelling and PND.  Gastrointestinal: Negative for abdominal pain, blood in stool, constipation, diarrhea, heartburn, melena, nausea and vomiting.  Genitourinary: Negative for dysuria and urgency.  Skin: Negative for itching and rash.       Left hand abrasion  Neurological: Negative for dizziness, sensory change, weakness and headaches.  Endo/Heme/Allergies: Bruises/bleeds easily.     Physical Exam:  There were no vitals taken for this visit.  General Appearance: Well nourished, in no apparent distress. Eyes: PERRLA, EOMs, conjunctiva no swelling or erythema Sinuses: No Frontal/maxillary tenderness ENT/Mouth: Ext aud canals clear, TMs without erythema, bulging. No erythema, swelling, or exudate on post pharynx.  Tonsils not swollen or erythematous. Hearing normal.  Neck: Supple, thyroid normal.  Respiratory: Respiratory effort normal, BS equal bilaterally without rales, rhonchi, wheezing or stridor.  Cardio: RRR with no MRGs, systolic murmur. Brisk peripheral pulses with trace bilateral edema.  Abdomen: Soft, obese/mildly distended, + BS.  Non tender, no guarding, rebound, hernias, masses. Lymphatics: Non tender without lymphadenopathy.   Musculoskeletal: Full ROM, 5/5 strength, normal gait.  Skin: Warm, dry without rashes, lesions; he has fragile skin and numerous small ecchymoses to bilateral upper extremities. Left hand, laceration, well approximated, no exudate noted, mild erythema, blanchable, non-tender. Neuro: Cranial nerves intact. Normal muscle tone, no cerebellar symptoms. Sensation intact.  Psych: Awake and oriented X 3,  normal affect, Insight and Judgment appropriate.     Garnet Sierras, Laqueta Jean, DNP [facility] 11/13/2019  10:33 AM

## 2019-11-14 ENCOUNTER — Other Ambulatory Visit: Payer: Self-pay | Admitting: Adult Health Nurse Practitioner

## 2019-11-14 DIAGNOSIS — I480 Paroxysmal atrial fibrillation: Secondary | ICD-10-CM

## 2019-11-14 DIAGNOSIS — D509 Iron deficiency anemia, unspecified: Secondary | ICD-10-CM

## 2019-11-14 LAB — CBC WITH DIFFERENTIAL/PLATELET
Absolute Monocytes: 638 cells/uL (ref 200–950)
Basophils Absolute: 43 cells/uL (ref 0–200)
Basophils Relative: 0.5 %
Eosinophils Absolute: 111 cells/uL (ref 15–500)
Eosinophils Relative: 1.3 %
HCT: 32 % — ABNORMAL LOW (ref 38.5–50.0)
Hemoglobin: 10.4 g/dL — ABNORMAL LOW (ref 13.2–17.1)
Lymphs Abs: 748 cells/uL — ABNORMAL LOW (ref 850–3900)
MCH: 31.9 pg (ref 27.0–33.0)
MCHC: 32.5 g/dL (ref 32.0–36.0)
MCV: 98.2 fL (ref 80.0–100.0)
MPV: 8.9 fL (ref 7.5–12.5)
Monocytes Relative: 7.5 %
Neutro Abs: 6962 cells/uL (ref 1500–7800)
Neutrophils Relative %: 81.9 %
Platelets: 209 10*3/uL (ref 140–400)
RBC: 3.26 10*6/uL — ABNORMAL LOW (ref 4.20–5.80)
RDW: 13.6 % (ref 11.0–15.0)
Total Lymphocyte: 8.8 %
WBC: 8.5 10*3/uL (ref 3.8–10.8)

## 2019-11-14 LAB — PROTIME-INR
INR: 2.3 — ABNORMAL HIGH
Prothrombin Time: 23.9 s — ABNORMAL HIGH (ref 9.0–11.5)

## 2019-11-21 ENCOUNTER — Other Ambulatory Visit: Payer: Medicare Other

## 2019-11-21 ENCOUNTER — Other Ambulatory Visit: Payer: Self-pay

## 2019-11-21 DIAGNOSIS — I480 Paroxysmal atrial fibrillation: Secondary | ICD-10-CM | POA: Diagnosis not present

## 2019-11-21 DIAGNOSIS — D509 Iron deficiency anemia, unspecified: Secondary | ICD-10-CM | POA: Diagnosis not present

## 2019-11-21 NOTE — Addendum Note (Signed)
Encounter addended by: Lance Morin, RN on: 11/21/2019 9:05 AM  Actions taken: Clinical Note Signed, Episode resolved

## 2019-11-21 NOTE — Progress Notes (Signed)
Discharge Progress Report  Patient Details  Name: [name]. MRN: [id number] Date of Birth: 17-Dec-1940 Referring Provider:     Pulmonary Rehab Walk Test from 08/22/2019 in [facility]  Referring Provider [name]       Number of Visits: 16  Reason for Discharge:  Patient reached a stable level of exercise. Patient independent in their exercise. Patient has met program and personal goals.  Smoking History:  Social History   Tobacco Use  Smoking Status Former Smoker  . Packs/day: 4.00  . Years: 25.00  . Pack years: 100.00  . Types: Cigarettes  . Quit date: 01/05/1981  . Years since quitting: 38.9  Smokeless Tobacco Never Used    Diagnosis:  Chronic diastolic (congestive) heart failure (HCC)  ADL [facility]:  Pulmonary Assessment Scores    Row Name 08/22/19 1538 08/22/19 1603 10/26/19 1538     ADL [facility]   ADL Phase Entry Entry Exit   SOB Score total 25 -- 27     CAT Score   CAT Score 8 -- 5     mMRC Score   mMRC Score -- 2 0          Initial Exercise Prescription:  Initial Exercise Prescription - 08/23/19 1400      Date of Initial Exercise RX and Referring Provider   Date 08/22/19    Referring Provider [name]    Expected Discharge Date 10/19/19      Recumbant Bike   Level 1    RPM 60    Minutes 15      NuStep   Level 2    SPM 80    Minutes 14      Prescription Details   Frequency (times per week) 2    Duration Progress to 30 minutes of continuous aerobic without signs/symptoms of physical distress      Intensity   THRR 40-80% of Max Heartrate 56-112    Ratings of Perceived Exertion 11-13    Perceived Dyspnea 0-4      Progression   Progression Continue to progress workloads to maintain intensity without signs/symptoms of physical distress.      Resistance Training   Training Prescription Yes    Weight orange bands    Reps 10-15           Discharge Exercise Prescription (Final Exercise  Prescription Changes):  Exercise Prescription Changes - 10/17/19 1500      Response to Exercise   Blood Pressure (Admit) 118/70    Blood Pressure (Exercise) 112/70    Blood Pressure (Exit) 130/78    Heart Rate (Admit) 71 bpm    Heart Rate (Exercise) 75 bpm    Heart Rate (Exit) 67 bpm    Oxygen Saturation (Admit) 95 %    Oxygen Saturation (Exercise) 95 %    Oxygen Saturation (Exit) 98 %    Rating of Perceived Exertion (Exercise) 11    Perceived Dyspnea (Exercise) 0    Duration Continue with 30 min of aerobic exercise without signs/symptoms of physical distress.    Intensity THRR unchanged      Progression   Progression Continue to progress workloads to maintain intensity without signs/symptoms of physical distress.    Average METs 2.4      Resistance Training   Training Prescription Yes    Weight Blue bands    Reps 10-15    Time 10 Minutes      NuStep   Level 4  SPM 80    Minutes 25    METs 2.4      Track   Laps 10    Minutes 10           Functional Capacity:  6 Minute Walk    Row Name 08/22/19 1605 10/26/19 1539       6 Minute Walk   Phase Initial Discharge    Distance 1334 feet 1394 feet    Distance % Change -- 4.49 %    Distance Feet Change -- 60 ft    Walk Time 6 minutes 6 minutes    # of Rest Breaks 0 0    MPH 2.53 2.64    METS 2.56 2.84    RPE 13 13    Perceived Dyspnea  1 1    VO2 Peak 8.96 9.96    Symptoms Yes (comment)  Right Hip and Knee hurt when walking due to arthritis Yes (comment)  right hip pain while walking    Resting HR 68 bpm 76 bpm    Resting BP 106/64 118/76    Resting Oxygen Saturation  98 % 97 %    Exercise Oxygen Saturation  during 6 min walk 92 % 92 %    Max Ex. HR 93 bpm 105 bpm    Max Ex. BP 136/60 146/74    2 Minute Post BP 136/64 142/80      Interval HR   1 Minute HR 92 84    2 Minute HR 90 93    3 Minute HR 93 94    4 Minute HR 84 99    5 Minute HR 87 100    6 Minute HR 89 105    2 Minute Post HR 67 81     Interval Heart Rate? Yes Yes      Interval Oxygen   Interval Oxygen? Yes Yes    Baseline Oxygen Saturation % 98 % 97 %    1 Minute Oxygen Saturation % 94 % 95 %    1 Minute Liters of Oxygen 0 L 0 L    2 Minute Oxygen Saturation % 94 % 94 %    2 Minute Liters of Oxygen 0 L 0 L    3 Minute Oxygen Saturation % 92 % 93 %    3 Minute Liters of Oxygen 0 L 0 L    4 Minute Oxygen Saturation % 92 % 93 %    4 Minute Liters of Oxygen 0 L 0 L    5 Minute Oxygen Saturation % 93 % 93 %    5 Minute Liters of Oxygen 0 L 0 L    6 Minute Oxygen Saturation % 92 % 92 %    6 Minute Liters of Oxygen 0 L 0 L    2 Minute Post Oxygen Saturation % 98 % 95 %    2 Minute Post Liters of Oxygen 0 L 0 L           Psychological, QOL, Others - Outcomes: PHQ 2/9: Depression screen [facility] 2/9 10/26/2019 10/10/2019 08/22/2019 08/22/2019 03/27/2019  Decreased Interest 0 0 0 0 0  Down, Depressed, Hopeless 0 0 0 0 0  PHQ - 2 Score 0 0 0 0 0  Altered sleeping 0 - 0 - -  Tired, decreased energy 0 - 1 - -  Change in appetite 0 - 0 - -  Feeling bad or failure about yourself  0 - 0 - -  Trouble concentrating 0 -  0 - -  Moving slowly or fidgety/restless 0 - 0 - -  Suicidal thoughts 0 - 0 - -  PHQ-9 Score 0 - 1 - -  Difficult doing work/chores Not difficult at all - Not difficult at all - -  Some recent data might be hidden    Quality of Life:   Personal Goals: Goals established at orientation with interventions provided to work toward goal.  Personal Goals and Risk Factors at Admission - 08/22/19 1504      Core Components/Risk Factors/Patient Goals on Admission   Improve shortness of breath with ADL's Yes    Intervention Provide education, individualized exercise plan and daily activity instruction to help decrease symptoms of SOB with activities of daily living.    Expected Outcomes Short Term: Improve cardiorespiratory fitness to achieve a reduction of symptoms when performing ADLs;Long Term: Be able to perform  more ADLs without symptoms or delay the onset of symptoms    Diabetes Yes    Intervention Provide education about signs/symptoms and action to take for hypo/hyperglycemia.;Provide education about proper nutrition, including hydration, and aerobic/resistive exercise prescription along with prescribed medications to achieve blood glucose in normal ranges: Fasting glucose 65-99 mg/dL    Expected Outcomes Long Term: Attainment of HbA1C < 7%.;Short Term: Participant verbalizes understanding of the signs/symptoms and immediate care of hyper/hypoglycemia, proper foot care and importance of medication, aerobic/resistive exercise and nutrition plan for blood glucose control.    Heart Failure Yes    Intervention Provide a combined exercise and nutrition program that is supplemented with education, support and counseling about heart failure. Directed toward relieving symptoms such as shortness of breath, decreased exercise tolerance, and extremity edema.    Expected Outcomes Improve functional capacity of life;Short term: Attendance in program 2-3 days a week with increased exercise capacity. Reported lower sodium intake. Reported increased fruit and vegetable intake. Reports medication compliance.;Short term: Daily weights obtained and reported for increase. Utilizing diuretic protocols set by physician.;Long term: Adoption of self-care skills and reduction of barriers for early signs and symptoms recognition and intervention leading to self-care maintenance.            Personal Goals Discharge:  Goals and Risk Factor Review    Row Name 09/13/19 2300 10/03/19 1435           Core Components/Risk Factors/Patient Goals Review   Personal Goals Review Improve shortness of breath with ADL's;Diabetes;Increase knowledge of respiratory medications and ability to use respiratory devices properly.;Heart Failure;Develop more efficient breathing techniques such as purse lipped breathing and diaphragmatic breathing and  practicing self-pacing with activity. Improve shortness of breath with ADL's;Diabetes;Increase knowledge of respiratory medications and ability to use respiratory devices properly.;Heart Failure;Develop more efficient breathing techniques such as purse lipped breathing and diaphragmatic breathing and practicing self-pacing with activity.      Review almond has completed 4 exercise sessions.  pt is able to recall techniques he learned from previous participation. Pt is observed using these techniques independently.  Continue to monitor.  Emmette has adequate blood glucose pre and post exercise.  Brady CHF is managed by Heart failure clinic and has diuretic prn medication that he demonstrate when to use the prn medicaiton, weigs daily and avoids high sodium foods. Erasto has completed 9 exercise sessions.[name]  Liev has participated in both cardiac and pulmonary rehab.  He  is able to recall techniques he learned from previous participation. Pt is observed using these techniques independently.  Continue to monitor.  Taige has adequate blood glucose  pre and post exercise.  Marquan CHF is managed by Heart failure clinic. Pt has fluctuations with his weight depending on when he takes his weekly diuretic.  presently per his wights her , he is up 7.48 pounds but wonder if the admission weight of 81.2 is correct as the other weights are in the 83-85kg range      Expected Outcomes See Admission Goals See Admission Goals             Exercise Goals and Review:  Exercise Goals    Row Name 08/23/19 1433             Exercise Goals   Increase Physical Activity Yes       Intervention Provide advice, education, support and counseling about physical activity/exercise needs.;Develop an individualized exercise prescription for aerobic and resistive training based on initial evaluation findings, risk stratification, comorbidities and participant's personal goals.       Expected Outcomes Short Term: Attend rehab on a regular  basis to increase amount of physical activity.;Long Term: Add in home exercise to make exercise part of routine and to increase amount of physical activity.;Long Term: Exercising regularly at least 3-5 days a week.       Increase Strength and Stamina Yes       Intervention Provide advice, education, support and counseling about physical activity/exercise needs.;Develop an individualized exercise prescription for aerobic and resistive training based on initial evaluation findings, risk stratification, comorbidities and participant's personal goals.       Expected Outcomes Short Term: Increase workloads from initial exercise prescription for resistance, speed, and METs.;Short Term: Perform resistance training exercises routinely during rehab and add in resistance training at home;Long Term: Improve cardiorespiratory fitness, muscular endurance and strength as measured by increased METs and functional capacity (6MWT)       Able to understand and use rate of perceived exertion (RPE) scale Yes       Intervention Provide education and explanation on how to use RPE scale       Expected Outcomes Short Term: Able to use RPE daily in rehab to express subjective intensity level;Long Term:  Able to use RPE to guide intensity level when exercising independently       Able to understand and use Dyspnea scale Yes       Intervention Provide education and explanation on how to use Dyspnea scale       Expected Outcomes Short Term: Able to use Dyspnea scale daily in rehab to express subjective sense of shortness of breath during exertion;Long Term: Able to use Dyspnea scale to guide intensity level when exercising independently       Knowledge and understanding of Target Heart Rate Range (THRR) Yes       Intervention Provide education and explanation of THRR including how the numbers were predicted and where they are located for reference       Expected Outcomes Short Term: Able to state/look up THRR;Long Term: Able to use  THRR to govern intensity when exercising independently;Short Term: Able to use daily as guideline for intensity in rehab       Understanding of Exercise Prescription Yes       Intervention Provide education, explanation, and written materials on patient's individual exercise prescription       Expected Outcomes Short Term: Able to explain program exercise prescription;Long Term: Able to explain home exercise prescription to exercise independently              Exercise Goals Re-Evaluation:  Exercise Goals Re-Evaluation    Row Name 09/14/19 1641 10/03/19 0714           Exercise Goal Re-Evaluation   Exercise Goals Review Increase Physical Activity;Increase Strength and Stamina;Able to understand and use rate of perceived exertion (RPE) scale;Able to understand and use Dyspnea scale;Knowledge and understanding of Target Heart Rate Range (THRR);Able to check pulse independently;Understanding of Exercise Prescription;Improve claudication pain tolerance and improve walking ability Increase Physical Activity;Increase Strength and Stamina;Able to understand and use rate of perceived exertion (RPE) scale;Able to understand and use Dyspnea scale;Knowledge and understanding of Target Heart Rate Range (THRR);Understanding of Exercise Prescription      Comments Pt has completed 5 exercise sessions with pulmonary rehab. We started him out exercising on the Recumbent bike for 15 minutes but he complained of knee pain. He is now walking the track for 15 minutes and using the Nustep for 15 minutes.He is fairly deconditioned but seems to have a positive outlook. He is currently exercising at 2.2 METs on the Nustep. We will continue to monitor and progress as he is able. pt has completed 9 exercise sessions. Pt still complains of knee pain, due to arthritis, each exercise session. He walks the track for 15 minutes when his knee is not giving him too much pain and also does The Nustep for 15 minutes. He is exercising  at 2.5 METS on the Nustep and 2.39 METS when walking the track.      Expected Outcomes Through exercise at rehab and home the patient will decrease shortness of breath with daily activities and feel confident in carrying out an exercise regimn at home Through exercise at rehab and home the patient will decrease shortness of breath with daily activities and feel confident in carrying out an exercise regimn at home             Nutrition & Weight - Outcomes:  Pre Biometrics - 08/23/19 1435      Pre Biometrics   Grip Strength 37 kg           Post Biometrics - 10/26/19 1539       Post  Biometrics   Grip Strength 31 kg           Nutrition:  Nutrition Therapy & Goals - 09/12/19 0746      Nutrition Therapy   Diet Low Sodium/Carb Modified    Drug/Food Interactions Coumadin/Vit K      Personal Nutrition Goals   Nutrition Goal Improved blood glucose control as evidenced by pt's A1c trending from 7.4 toward less than 7.0.    Personal Goal #2 CBG concentrations in the normal range or as close to normal as is safely possible.    Personal Goal #3 Pt to identify and limit food sources of saturated fat, trans fat, refined carbohydrates and sodium      Intervention Plan   Intervention Prescribe, educate and counsel regarding individualized specific dietary modifications aiming towards targeted core components such as weight, hypertension, lipid management, diabetes, heart failure and other comorbidities.    Expected Outcomes Short Term Goal: A plan has been developed with personal nutrition goals set during dietitian appointment.           Nutrition Discharge:  Nutrition Assessments - 11/16/19 0923      Rate Your Plate Scores   Post Score 61           Education Questionnaire Score:  Knowledge Questionnaire Score - 10/26/19 1538      Knowledge Questionnaire  Score   Pre Score 16/18           Goals reviewed with patient; copy given to patient.

## 2019-11-22 LAB — IRON, TOTAL/TOTAL IRON BINDING CAP
%SAT: 7 % (calc) — ABNORMAL LOW (ref 20–48)
Iron: 28 ug/dL — ABNORMAL LOW (ref 50–180)
TIBC: 394 mcg/dL (calc) (ref 250–425)

## 2019-11-22 LAB — CBC WITH DIFFERENTIAL/PLATELET
Absolute Monocytes: 421 cells/uL (ref 200–950)
Basophils Absolute: 49 cells/uL (ref 0–200)
Basophils Relative: 0.8 %
Eosinophils Absolute: 122 cells/uL (ref 15–500)
Eosinophils Relative: 2 %
HCT: 31.8 % — ABNORMAL LOW (ref 38.5–50.0)
Hemoglobin: 10.1 g/dL — ABNORMAL LOW (ref 13.2–17.1)
Lymphs Abs: 580 cells/uL — ABNORMAL LOW (ref 850–3900)
MCH: 31.4 pg (ref 27.0–33.0)
MCHC: 31.8 g/dL — ABNORMAL LOW (ref 32.0–36.0)
MCV: 98.8 fL (ref 80.0–100.0)
MPV: 9.1 fL (ref 7.5–12.5)
Monocytes Relative: 6.9 %
Neutro Abs: 4929 cells/uL (ref 1500–7800)
Neutrophils Relative %: 80.8 %
Platelets: 172 10*3/uL (ref 140–400)
RBC: 3.22 10*6/uL — ABNORMAL LOW (ref 4.20–5.80)
RDW: 13.3 % (ref 11.0–15.0)
Total Lymphocyte: 9.5 %
WBC: 6.1 10*3/uL (ref 3.8–10.8)

## 2019-11-26 ENCOUNTER — Other Ambulatory Visit: Payer: Self-pay | Admitting: Internal Medicine

## 2019-11-26 DIAGNOSIS — N183 Chronic kidney disease, stage 3 unspecified: Secondary | ICD-10-CM

## 2019-11-26 DIAGNOSIS — Z0001 Encounter for general adult medical examination with abnormal findings: Secondary | ICD-10-CM

## 2019-11-26 DIAGNOSIS — E1122 Type 2 diabetes mellitus with diabetic chronic kidney disease: Secondary | ICD-10-CM

## 2019-12-11 ENCOUNTER — Other Ambulatory Visit: Payer: Self-pay | Admitting: Internal Medicine

## 2019-12-11 ENCOUNTER — Other Ambulatory Visit: Payer: Self-pay | Admitting: *Deleted

## 2019-12-11 MED ORDER — WARFARIN SODIUM 5 MG PO TABS
ORAL_TABLET | ORAL | 3 refills | Status: DC
Start: 2019-12-11 — End: 2020-11-19

## 2019-12-18 ENCOUNTER — Encounter: Payer: Self-pay | Admitting: Internal Medicine

## 2019-12-18 ENCOUNTER — Ambulatory Visit (INDEPENDENT_AMBULATORY_CARE_PROVIDER_SITE_OTHER): Payer: Medicare Other | Admitting: Internal Medicine

## 2019-12-18 VITALS — BP 128/76 | HR 72 | Temp 97.7°F | Resp 16 | Ht 71.0 in | Wt 188.8 lb

## 2019-12-18 DIAGNOSIS — Z794 Long term (current) use of insulin: Secondary | ICD-10-CM

## 2019-12-18 DIAGNOSIS — I10 Essential (primary) hypertension: Secondary | ICD-10-CM

## 2019-12-18 DIAGNOSIS — N184 Chronic kidney disease, stage 4 (severe): Secondary | ICD-10-CM | POA: Diagnosis not present

## 2019-12-18 DIAGNOSIS — E1169 Type 2 diabetes mellitus with other specified complication: Secondary | ICD-10-CM | POA: Diagnosis not present

## 2019-12-18 DIAGNOSIS — I480 Paroxysmal atrial fibrillation: Secondary | ICD-10-CM

## 2019-12-18 DIAGNOSIS — E785 Hyperlipidemia, unspecified: Secondary | ICD-10-CM

## 2019-12-18 DIAGNOSIS — Z7901 Long term (current) use of anticoagulants: Secondary | ICD-10-CM

## 2019-12-18 DIAGNOSIS — D509 Iron deficiency anemia, unspecified: Secondary | ICD-10-CM

## 2019-12-18 DIAGNOSIS — E1122 Type 2 diabetes mellitus with diabetic chronic kidney disease: Secondary | ICD-10-CM

## 2019-12-18 DIAGNOSIS — Z79899 Other long term (current) drug therapy: Secondary | ICD-10-CM

## 2019-12-18 NOTE — Progress Notes (Signed)
History of Present Illness:                                      This very nice 79 y.o.  MWM  With  HTN, ASHD/CABG, chAfib, Pulm. HTN, COPD, HLD, T2_NIDDM , COPD and Vitamin D Deficiency. Patient is also followed by Dr Irene Limbo  And had iron infusion for Chronic Anemia.                                        HTN predates since  19. Patient's BP has been controlled at home.  Today's BP is at goal -  118/64. In 2018, patient underwent a CABG & MAZE procedure and also has has RFA's for AFib /Flutter.  He's been on Coumadin  For chronic Afib and is also followed in the Heart Failure Clinic by Dr [name].  Patient also has Pulmonary Hypertension attributed to his COPD & Restrictive Lung Disease. Patient has DOE, but denies any recent CP, palpitations, dizziness or ankle swelling.     Medications  .  Novolin(70-30) 100 UNIT/ML injection, Inject 25-45 Units into the skin See admin instructions. 45 units in the morning and 30 units in the evening  .  atorvastatin  20 MG tablet, Take      1 tablet       3 x /week       for Cholesterol .  carvedilol  6.25 MG tablet, Take 0.5 tablets (3.125 mg total) by mouth in the morning AND 1 tablet (6.25 mg total) every evening. .  metolazone 2.5 MG tablet, Take 2.5 mg by mouth daily as needed  .  torsemide100 MG tablet, Take 150 mg by mouth daily.  .  ASTELIN 0.1 % nasal spray, Place 2 sprays into both nostrils 2  times daily as needed for rhinitis or allergies. .  diphenhydrAMINE  25 MG tablet, Take 25 mg by mouth at bedtime as needed for allergies.  [name]  FLONASE nasal spray, Place 1 spray into both nostrils daily as needed for allergies. [name]  loratadine  10 MG tablet, Take 10 mg by mouth daily as needed for allergies.   [name]  acetaminophen  500 MG tablet, Take 1,000 mg  every 6 hours as needed  .  allopurinol (  300 MG tablet, Take 150 mg daily. [name]  aspirin EC 81 MG tablet, Take  Daily.   [name]  warfarin 5 MG tablet, Take    1.5 tablets /day      or as  directed  .  ALPRAZolam 1 MG tablet, Take 0.5 tablets (0.5 mg total) by mouth at bedtime as needed for sleep.  [name]  GAVISCON , Take 4 tablets by mouth daily as needed .  [name]  VITAMIN B COMPLEX , Take 1 tablet by mouth at bedtime.   [name]  VITAMIN D Take 5,000 Units by mouth daily.  [name]  lidocaine (LIDODERM) 5 %, Place 1 patch onto the skin daily as needed (for pain). [name]  PRESERVISION AREDS 2)---------------------------------------------CAPS, Take 1 capsule by mouth 2 (two) times daily.  .  polyvinyl alcohol (ARTIFICIAL TEARS) 1.4 % ophthalmic solution, Place 1 drop into both eyes daily as needed for dry eyes. .  potassium chloride SA (KLOR-CON) 20 MEQ tablet, Take      2 tablets  3 x /day       for Potassium .  sertraline (ZOLOFT) 100 MG tablet, Take 1 tablet Daily for Mood (Patient taking differently: Take 100 mg by mouth daily. 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; Type 2 diabetes mellitus with stage 4 chronic kidney disease, with long-term current use of insulin ([place]); GERD; DJD (degenerative joint disease); Vitamin D deficiency; Chronic anticoagulation (INR goal 2.0-2.5); Essential hypertension; CAD (coronary artery disease); PFO (patent foramen ovale); 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[place]); OSA on CPAP; S/P Maze operation for atrial fibrillation; Pulmonary hypertension ([place]); 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   Observations/Objective:   BP 128/76   Pulse 72   Temp 97.7 F (36.5 C)   Resp 16   Ht 5\' 11"  (1.803 m)   Wt 188 lb 12.8 oz (85.6 kg)   SpO2 98%   BMI 26.33 kg/m   HEENT - WNL. Neck - supple.  Chest - Clear equal BS. Cor - Nl HS. RRR w/o sig MGR. PP 1(+). 1(+) pretibial  edema. MS- FROM w/o deformities.  Gait Nl. Neuro -  Nl w/o focal abnormalities.  Assessment and Plan:  1. Essential hypertension  - CBC with Differential/Platelet  2. Hyperlipidemia associated with type 2 diabetes mellitus ([place])   3. Type 2 diabetes mellitus with stage 4 chronic kidney  disease, with long-term current use of insulin (H[facility])  - CBC with Differential/Platelet  4. Iron deficiency anemia, unspecified iron deficiency anemia type  - CBC with Differential/Platelet  5. Chronic anticoagulation  - Protime-INR  6. Medication management  - Protime-INR  7. PAF (paroxysmal atrial fibrillation) ([place])  - Protime-INR   Follow Up Instructions:         I  discussed the assessment and treatment plan with the patient. The patient was provided an opportunity to ask questions and all were answered. The patient agreed with the plan and demonstrated an understanding of the instructions.        The patient was advised to call back or seek an in-person evaluation if the symptoms worsen or if the condition fails to improve as anticipated.   Kirtland Bouchard, MD

## 2019-12-18 NOTE — Patient Instructions (Addendum)
Due to recent changes in healthcare laws, you may see the results of your imaging and laboratory studies on MyChart before your provider has had a chance to review them.  We understand that in some cases there may be results that are confusing or concerning to you. Not all laboratory results come back in the same time frame and the provider may be waiting for multiple results in order to interpret others.  Please give us 48 hours in order for your provider to thoroughly review all the results before contacting the office for clarification of your results.     ++++++++++++++++++++++++++++++++++  Vit D  & Vit C 1,000 mg   are recommended to help protect  against the Covid-19 and other Corona viruses.    Also it's recommended  to take  Zinc 50 mg  to help  protect against the Covid-19   and best place to get  is also on [employer]  and don't pay more than 6-8 cents /pill !   ===================================== Coronavirus (COVID-19) Are you at risk?  Are you at risk for the Coronavirus (COVID-19)?  To be considered HIGH RISK for Coronavirus (COVID-19), you have to meet the following criteria:  . Traveled to [place], [place], [place], [place] or [place]; or in the [place] to [place], [place], [place]  . or [place]; and have fever, cough, and shortness of breath within the last 2 weeks of travel OR . Been in close contact with a person diagnosed with COVID-19 within the last 2 weeks and have  . fever, cough,and shortness of breath .  . IF YOU DO NOT MEET THESE CRITERIA, YOU ARE CONSIDERED LOW RISK FOR COVID-19.  What to do if you are HIGH RISK for COVID-19?  . If you are having a medical emergency, call 911. . Seek medical care right away. Before you go to a doctor's office, urgent care or emergency department, .  call ahead and tell them about your recent travel, contact with someone diagnosed with COVID-19  .  and your symptoms.  . You should receive instructions  from your physician's office regarding next steps of care.  . When you arrive at healthcare provider, tell the healthcare staff immediately you have returned from  . visiting [place], [place], [place], [place] or [place]; or traveled in the [place] to [place], [place],  . [place] or [place] in the last two weeks or you have been in close contact with a person diagnosed with  . COVID-19 in the last 2 weeks.   . Tell the health care staff about your symptoms: fever, cough and shortness of breath. . After you have been seen by a medical provider, you will be either: o Tested for (COVID-19) and discharged home on quarantine except to seek medical care if  o symptoms worsen, and asked to  - Stay home and avoid contact with others until you get your results (4-5 days)  - Avoid travel on public transportation if possible (such as bus, train, or airplane) or o Sent to the Emergency Department by EMS for evaluation, COVID-19 testing  and  o possible admission depending on your condition and test results.  What to do if you are LOW RISK for COVID-19?  Reduce your risk of any infection by using the same precautions used for avoiding the common cold or flu:  . Wash your hands often with soap and warm water for at least 20 seconds.  If soap and water   are not readily available,  . use an alcohol-based hand sanitizer with at least 60% alcohol.  . If coughing or sneezing, cover your mouth and nose by coughing or sneezing into the elbow areas of your shirt or coat, .  into a tissue or into your sleeve (not your hands). . Avoid shaking hands with others and consider head nods or verbal greetings only. . Avoid touching your eyes, nose, or mouth with unwashed hands.  . Avoid close contact with people who are sick. . Avoid places or events with large numbers of people in one location, like concerts or sporting events. . Carefully consider travel plans you have or are making. . If you are planning  any travel outside or inside the [place], visit the CDC's Travelers' Health webpage for the latest health notices. . If you have some symptoms but not all symptoms, continue to monitor at home and seek medical attention  . if your symptoms worsen. . If you are having a medical emergency, call 911.   ++++++++++++++++++++++++++++++++ Recommend Adult Low Dose Aspirin or  coated  Aspirin 81 mg daily  To reduce risk of Colon Cancer 40 %,  Skin Cancer 26 % ,  Melanoma 46%  and  Pancreatic cancer 60% ++++++++++++++++++++++++++++++++ Vitamin D goal  is between 70-100.  Please make sure that you are taking your Vitamin D as directed.  It is very important as a natural anti-inflammatory  helping hair, skin, and nails, as well as reducing stroke and heart attack risk.  It helps your bones and helps with mood. It also decreases numerous cancer risks so please take it as directed.  Low Vit D is associated with a 200-300% higher risk for CANCER  and 200-300% higher risk for HEART   ATTACK  &  STROKE.   ...................................... It is also associated with higher death rate at younger ages,  autoimmune diseases like Rheumatoid arthritis, Lupus, Multiple Sclerosis.    Also many other serious conditions, like depression, Alzheimer's Dementia, infertility, muscle aches, fatigue, fibromyalgia - just to name a few. ++++++++++++++++++++ Recommend the book "The END of DIETING" by Dr Joel Fuhrman  & the book "The END of DIABETES " by Dr Joel Fuhrman At [employer] - get book & Audio CD's    Being diabetic has a  300% increased risk for heart attack, stroke, cancer, and alzheimer- type vascular dementia. It is very important that you work harder with diet by avoiding all foods that are white. Avoid white rice (brown & wild rice is OK), white potatoes (sweetpotatoes in moderation is OK), White bread or wheat bread or anything made out of white flour like bagels, donuts, rolls, buns, biscuits, cakes,  pastries, cookies, pizza crust, and pasta (made from white flour & egg whites) - vegetarian pasta or spinach or wheat pasta is OK. Multigrain breads like Arnold's or Pepperidge [employer], or multigrain sandwich thins or flatbreads.  Diet, exercise and weight loss can reverse and cure diabetes in the early stages.  Diet, exercise and weight loss is very important in the control and prevention of complications of diabetes which affects every system in your body, ie. Brain - dementia/stroke, eyes - glaucoma/blindness, heart - heart attack/heart failure, kidneys - dialysis, stomach - gastric paralysis, intestines - malabsorption, nerves - severe painful neuritis, circulation - gangrene & loss of a leg(s), and finally cancer and Alzheimers.    I recommend avoid fried & greasy foods,  sweets/candy, white rice (brown or wild rice or Quinoa is OK), white potatoes (  sweet potatoes are OK) - anything made from white flour - bagels, doughnuts, rolls, buns, biscuits,white and wheat breads, pizza crust and traditional pasta made of white flour & egg white(vegetarian pasta or spinach or wheat pasta is OK).  Multi-grain bread is OK - like multi-grain flat bread or sandwich thins. Avoid alcohol in excess. Exercise is also important.    Eat all the vegetables you want - avoid meat, especially red meat and dairy - especially cheese.  Cheese is the most concentrated form of trans-fats which is the worst thing to clog up our arteries. Veggie cheese is OK which can be found in the fresh produce section at [employer] or [employer] or [employer]  +++++++++++++++++++++ DASH Eating Plan  DASH stands for "Dietary Approaches to Stop Hypertension."   The DASH eating plan is a healthy eating plan that has been shown to reduce high blood pressure (hypertension). Additional health benefits may include reducing the risk of type 2 diabetes mellitus, heart disease, and stroke. The DASH eating plan may also help with weight loss. WHAT DO I  NEED TO KNOW ABOUT THE DASH EATING PLAN? For the DASH eating plan, you will follow these general guidelines:  Choose foods with a percent daily value for sodium of less than 5% (as listed on the food label).  Use salt-free seasonings or herbs instead of table salt or sea salt.  Check with your health care provider or pharmacist before using salt substitutes.  Eat lower-sodium products, often labeled as "lower sodium" or "no salt added."  Eat fresh foods.  Eat more vegetables, fruits, and low-fat dairy products.  Choose whole grains. Look for the word "whole" as the first word in the ingredient list.  Choose fish   Limit sweets, desserts, sugars, and sugary drinks.  Choose heart-healthy fats.  Eat veggie cheese   Eat more home-cooked food and less restaurant, buffet, and fast food.  Limit fried foods.  Cook foods using methods other than frying.  Limit canned vegetables. If you do use them, rinse them well to decrease the sodium.  When eating at a restaurant, ask that your food be prepared with less salt, or no salt if possible.                      WHAT FOODS CAN I EAT? Read Dr Joel Fuhrman's books on The End of Dieting & The End of Diabetes  Grains Whole grain or whole wheat bread. Brown rice. Whole grain or whole wheat pasta. Quinoa, bulgur, and whole grain cereals. Low-sodium cereals. Corn or whole wheat flour tortillas. Whole grain cornbread. Whole grain crackers. Low-sodium crackers.  Vegetables Fresh or frozen vegetables (raw, steamed, roasted, or grilled). Low-sodium or reduced-sodium tomato and vegetable juices. Low-sodium or reduced-sodium tomato sauce and paste. Low-sodium or reduced-sodium canned vegetables.   Fruits All fresh, canned (in natural juice), or frozen fruits.  Protein Products  All fish and seafood.  Dried beans, peas, or lentils. Unsalted nuts and seeds. Unsalted canned beans.  Dairy Low-fat dairy products, such as skim or 1% milk, 2% or  reduced-fat cheeses, low-fat ricotta or cottage cheese, or plain low-fat yogurt. Low-sodium or reduced-sodium cheeses.  Fats and Oils Tub margarines without trans fats. Light or reduced-fat mayonnaise and salad dressings (reduced sodium). Avocado. Safflower, olive, or canola oils. Natural peanut or almond butter.  Other Unsalted popcorn and pretzels. The items listed above may not be a complete list of recommended foods or beverages. Contact your dietitian for more   options.  +++++++++++++++  WHAT FOODS ARE NOT RECOMMENDED? Grains/ White flour or wheat flour White bread. White pasta. White rice. Refined cornbread. Bagels and croissants. Crackers that contain trans fat.  Vegetables  Creamed or fried vegetables. Vegetables in a . Regular canned vegetables. Regular canned tomato sauce and paste. Regular tomato and vegetable juices.  Fruits Dried fruits. Canned fruit in light or heavy syrup. Fruit juice.  Meat and Other Protein Products Meat in general - RED meat & White meat.  Fatty cuts of meat. Ribs, chicken wings, all processed meats as bacon, sausage, bologna, salami, fatback, hot dogs, bratwurst and packaged luncheon meats.  Dairy Whole or 2% milk, cream, half-and-half, and cream cheese. Whole-fat or sweetened yogurt. Full-fat cheeses or blue cheese. Non-dairy creamers and whipped toppings. Processed cheese, cheese spreads, or cheese curds.  Condiments Onion and garlic salt, seasoned salt, table salt, and sea salt. Canned and packaged gravies. Worcestershire sauce. Tartar sauce. Barbecue sauce. Teriyaki sauce. Soy sauce, including reduced sodium. Steak sauce. Fish sauce. Oyster sauce. Cocktail sauce. Horseradish. Ketchup and mustard. Meat flavorings and tenderizers. Bouillon cubes. Hot sauce. Tabasco sauce. Marinades. Taco seasonings. Relishes.  Fats and Oils Butter, stick margarine, lard, shortening and bacon fat. Coconut, palm kernel, or palm oils. Regular salad  dressings.  Pickles and olives. Salted popcorn and pretzels.  The items listed above may not be a complete list of foods and beverages to avoid. ++++++++++++++++++++++++++++++++++  Bleeding Precautions When on Anticoagulant Therapy  Anticoagulant therapy, also called blood thinner therapy, is medicine that helps to prevent and treat blood clots. The medicine works by stopping blood clots from forming or growing. Blood clots that form in your blood vessels can be dangerous. They can break loose and travel to the heart, lungs, or brain. This increases the risk of a heart attack, stroke, or blocked lung artery (pulmonary embolism). Anticoagulants also increase the risk of bleeding. Try to protect yourself from cuts and other injuries that can cause bleeding. It is important to take anticoagulants exactly as told by your health care provider. Why do I need to be on anticoagulant therapy? You may need this medicine if you are at risk of developing a blood clot. Conditions that increase your risk of a blood clot include:  Being born with heart disease or a heart malformation (congenital heart disease).  Developing heart disease.  Having had surgery, such as valve replacement.  Having had a serious accident or other type of severe injury (trauma).  Having certain types of cancer.  Having certain diseases that can increase blood clotting.  Having a high risk of stroke or heart attack.  Having atrial fibrillation (AF). What are the common anticoagulant medicines? There are several types of anticoagulant medicines. The most common types are:  Medicines that you take by mouth (oral medicines), such as: ? Warfarin. ? Novel oral anticoagulants (NOACs), such as:  Direct thrombin inhibitors (dabigatran).  Factor Xa inhibitors (apixaban, edoxaban, and rivaroxaban).  Injections, such as: ? Unfractionated heparin. ? Low molecular weight heparin. These anticoagulants work in different ways to  prevent blood clots. They also have different risks and side effects. What do I need to remember while on anticoagulant therapy? Taking anticoagulants  Take your medicine at the same time every day. If you forget to take your medicine, take it as soon as you remember. Do not double your dosage of medicine if you miss a whole day. Take your normal dose and call your health care provider.  Do not stop taking   your medicine unless your health care provider approves. Stopping the medicine can increase your risk of developing a blood clot. Taking other medicines  Take over-the-counter and prescriptions medicines only as told by your health care provider.  Do not take over-the-counter NSAIDs, including aspirin and ibuprofen, while you are on anticoagulant therapy. These medicines increase your risk of dangerous bleeding.  Get approval from your health care provider before you start taking any new medicines, vitamins, or herbal products. Some of these could interfere with your therapy. General instructions  Keep all follow-up visits as told by your health care provider. This is important.  If you are pregnant or trying to get pregnant, talk with a health care provider about anticoagulants. Some of these medicines are not safe to take during pregnancy.  Tell all health care providers, including your dentist, that you are on anticoagulant therapy. It is especially important to tell providers before you have any surgery, medical procedures, or dental work done. What precautions should I take?   Be very careful when using knives, scissors, or other sharp objects.  Use an electric razor instead of a blade.  Do not use toothpicks.  Use a soft-bristled toothbrush. Brush your teeth gently.  Always wear shoes outdoors and wear slippers indoors.  Be careful when cutting your fingernails and toenails.  Place bath mats in the bathroom. If possible, install handrails as well.  Wear gloves while you  do yard work.  Wear your seat belt.  Prevent falls by removing loose rugs and extension cords from areas where you walk. Use a cane or walker if you need it.  Avoid constipation by: ? Drinking enough fluid to keep your urine clear or pale yellow. ? Eating foods that are high in fiber, such as fresh fruits and vegetables, whole grains, and beans. ? Limiting foods that are high in fat and processed sugars, such as fried and sweet foods.  Do not play contact sports or participate in other activities that have a high risk for injury. What other precautions are important if on warfarin therapy? If you are taking a type of anticoagulant called warfarin, make sure you:  Work with a diet and nutrition specialist (dietitian) to make an eating plan. Do not make any sudden changes to your diet after you have started your eating plan.  Do not drink alcohol. It can interfere with your medicine and increase your risk of an injury that causes bleeding.  Get regular blood tests as told by your health care provider. What are some questions to ask my health care provider?  Why do I need anticoagulant therapy?  What is the best anticoagulant therapy for my condition?  How long will I need anticoagulant therapy?  What are the side effects of anticoagulant therapy?  When should I take my medicine? What should I do if I forget to take it?  Will I need to have regular blood tests?  Do I need to change my diet? Are there foods or drinks that I should avoid?  What activities are safe for me?  What should I do if I want to get pregnant? Contact a health care provider if:  You miss a dose of medicine: ? And you are not sure what to do. ? For more than one day.  You have: ? Menstrual bleeding that is heavier than normal. ? Bloody or brown urine. ? Easy bruising. ? Black and tarry stool or bright red stool. ? Side effects from your medicine.  You   feel weak or dizzy.  You become  pregnant. Get help right away if:  You have bleeding that will not stop within 20 minutes from: ? The nose. ? The gums. ? A cut on the skin.  You have a severe headache or stomachache.  You vomit or cough up blood.  You fall or hit your head. Summary  Anticoagulant therapy, also called blood thinner therapy, is medicine that helps to prevent and treat blood clots.  Anticoagulants work in different ways to prevent blood clots. They also have different risks and side effects.  Talk with your health care provider about any precautions that you should take while on anticoagulant therapy.    

## 2019-12-19 ENCOUNTER — Other Ambulatory Visit: Payer: Self-pay | Admitting: Internal Medicine

## 2019-12-19 DIAGNOSIS — I480 Paroxysmal atrial fibrillation: Secondary | ICD-10-CM

## 2019-12-19 DIAGNOSIS — Z7901 Long term (current) use of anticoagulants: Secondary | ICD-10-CM

## 2019-12-19 LAB — CBC WITH DIFFERENTIAL/PLATELET
Absolute Monocytes: 713 cells/uL (ref 200–950)
Basophils Absolute: 16 cells/uL (ref 0–200)
Basophils Relative: 0.2 %
Eosinophils Absolute: 98 cells/uL (ref 15–500)
Eosinophils Relative: 1.2 %
HCT: 34.6 % — ABNORMAL LOW (ref 38.5–50.0)
Hemoglobin: 11.1 g/dL — ABNORMAL LOW (ref 13.2–17.1)
Lymphs Abs: 697 cells/uL — ABNORMAL LOW (ref 850–3900)
MCH: 29.3 pg (ref 27.0–33.0)
MCHC: 32.1 g/dL (ref 32.0–36.0)
MCV: 91.3 fL (ref 80.0–100.0)
MPV: 9.3 fL (ref 7.5–12.5)
Monocytes Relative: 8.7 %
Neutro Abs: 6675 cells/uL (ref 1500–7800)
Neutrophils Relative %: 81.4 %
Platelets: 226 10*3/uL (ref 140–400)
RBC: 3.79 10*6/uL — ABNORMAL LOW (ref 4.20–5.80)
RDW: 13.8 % (ref 11.0–15.0)
Total Lymphocyte: 8.5 %
WBC: 8.2 10*3/uL (ref 3.8–10.8)

## 2019-12-19 LAB — PROTIME-INR
INR: 1.8 — ABNORMAL HIGH
Prothrombin Time: 18.7 s — ABNORMAL HIGH (ref 9.0–11.5)

## 2019-12-19 NOTE — Progress Notes (Signed)
========================================================== -   Test results slightly outside the reference range are not unusual. If there is anything important, I will review this with you,  otherwise it is considered normal test values.  If you have further questions,  please do not hesitate to contact me at the office or via My Chart.  ==========================================================  -  Protime / INR = 1.8 x  - slightly low - continue Coumadin 1.5 tab daily  - Recommend recheck Lab  / Protime in 2 weeks

## 2020-01-01 ENCOUNTER — Other Ambulatory Visit: Payer: Self-pay

## 2020-01-01 ENCOUNTER — Ambulatory Visit (INDEPENDENT_AMBULATORY_CARE_PROVIDER_SITE_OTHER): Payer: Medicare Other | Admitting: *Deleted

## 2020-01-01 DIAGNOSIS — Z7901 Long term (current) use of anticoagulants: Secondary | ICD-10-CM

## 2020-01-01 DIAGNOSIS — I480 Paroxysmal atrial fibrillation: Secondary | ICD-10-CM

## 2020-01-01 NOTE — Progress Notes (Signed)
Patient  here for a NV to recheck his PT/INR. Patient takes Coumadin 5 mg 1.5 tablets daily.

## 2020-01-02 LAB — PROTIME-INR
INR: 1.3 — ABNORMAL HIGH
Prothrombin Time: 13.3 s — ABNORMAL HIGH (ref 9.0–11.5)

## 2020-01-02 NOTE — Progress Notes (Signed)
========================================================== -   Test results slightly outside the reference range are not unusual. If there is anything important, I will review this with you,  otherwise it is considered normal test values.  If you have further questions,  please do not hesitate to contact me at the office or via My Chart.  ==========================================================  -  PT/INR = 1.3 x  on Coumadin 5 mg x 1.5 tab = 7.5 mg /day for 2 weeks    - So recommend increase coumadin 5 mg x 2 tabs = 10 mg  /day for next 4 days , then cut back to   1.5 tab = 7.5 mg    qod - even days    & 2 tabs = 10 mg     qod   - odd days   - til come in Jan 17  OV w Caryl Pina     - Physicist, medical

## 2020-01-11 DIAGNOSIS — M1990 Unspecified osteoarthritis, unspecified site: Secondary | ICD-10-CM | POA: Diagnosis not present

## 2020-01-11 DIAGNOSIS — E1169 Type 2 diabetes mellitus with other specified complication: Secondary | ICD-10-CM | POA: Diagnosis not present

## 2020-01-11 DIAGNOSIS — N189 Chronic kidney disease, unspecified: Secondary | ICD-10-CM | POA: Diagnosis not present

## 2020-01-11 DIAGNOSIS — E785 Hyperlipidemia, unspecified: Secondary | ICD-10-CM | POA: Diagnosis not present

## 2020-01-11 DIAGNOSIS — M79643 Pain in unspecified hand: Secondary | ICD-10-CM | POA: Diagnosis not present

## 2020-01-11 DIAGNOSIS — M79644 Pain in right finger(s): Secondary | ICD-10-CM | POA: Diagnosis not present

## 2020-01-11 DIAGNOSIS — M5459 Other low back pain: Secondary | ICD-10-CM | POA: Diagnosis not present

## 2020-01-11 DIAGNOSIS — M109 Gout, unspecified: Secondary | ICD-10-CM | POA: Diagnosis not present

## 2020-01-19 ENCOUNTER — Encounter (INDEPENDENT_AMBULATORY_CARE_PROVIDER_SITE_OTHER): Payer: Medicare Other | Admitting: Ophthalmology

## 2020-01-19 ENCOUNTER — Other Ambulatory Visit: Payer: Self-pay

## 2020-01-19 DIAGNOSIS — H43813 Vitreous degeneration, bilateral: Secondary | ICD-10-CM

## 2020-01-19 DIAGNOSIS — H4311 Vitreous hemorrhage, right eye: Secondary | ICD-10-CM | POA: Diagnosis not present

## 2020-01-19 DIAGNOSIS — H35033 Hypertensive retinopathy, bilateral: Secondary | ICD-10-CM | POA: Diagnosis not present

## 2020-01-19 DIAGNOSIS — H34832 Tributary (branch) retinal vein occlusion, left eye, with macular edema: Secondary | ICD-10-CM

## 2020-01-19 DIAGNOSIS — I10 Essential (primary) hypertension: Secondary | ICD-10-CM | POA: Diagnosis not present

## 2020-01-19 DIAGNOSIS — H338 Other retinal detachments: Secondary | ICD-10-CM | POA: Diagnosis not present

## 2020-01-19 NOTE — Progress Notes (Deleted)
MEDICARE ANNUAL WELLNESS VISIT AND FOLLOW UP Assessment:   Medicare annual wellness visit  PAF (paroxysmal atrial fibrillation) ([name]) Continue follow up cardio, continue coumadin -     CBC with Differential/Platelet  Chronic diastolic congestive heart failure (HCC) Weight stable, continue medications, refill potassium -     potassium chloride SA (K-DUR,KLOR-CON) 20 MEQ tablet; Take 1 tablet (20 mEq total) by mouth 2 (two) times daily.  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 Call if greater than 130/80.  -     CMP/GFR  Coronary artery disease involving native coronary artery of native heart without angina pectoris Control blood pressure, cholesterol, glucose, increase exercise.   PFO (patent foramen ovale) Monitoring with cardio  Pulmonary hypertension (HCC) Monitored by cardio and pulmonary, doing much better  Atrial flutter, unspecified type ([name]) Continue follow up cardio, continue coumadin  Chronic respiratory failure with hypoxia (HCC) Monitored by pulmonary  OSA on CPAP Continue CPAP  Chronic restrictive lung disease Monitored by pulmonary  Diverticulosis of large intestine without hemorrhage  No tenderness, continue fiber  GERD Continue PPI/H2 blocker, diet discussed  Controlled type 2 diabetes mellitus with stage 3 chronic kidney disease, without long-term current use of insulin ([name]) Discussed general issues about diabetes pathophysiology and management., Educational material distributed., Suggested low cholesterol diet., Encouraged aerobic exercise., Discussed foot care.,  Reminded to get yearly retinal exam - report from most recent verified  Osteoarthritis, unspecified osteoarthritis type, unspecified site Monitor  CKD (chronic kidney disease) stage 3, GFR 30-59 ml/min  avoid NSAIDS, monitor sugars, will monitor -     BASIC METABOLIC PANEL WITH GFR  Hyperlipidemia -continue medications, check lipids, decrease fatty foods, increase activity.   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   Gout, unspecified cause, unspecified chronicity, unspecified site Monitor, check uric acid today and forward to [name] per patient request  Iron deficiency/blood loss anemia - check CBC, check iron  Senile purpura R/t age, coumadin; reassured; protect skin; monitor   Over 30 minutes of exam, counseling, chart review, and critical decision making was performed  Future Appointments  Date Time Provider [name]  01/22/2020 11:15 AM Liane Comber, NP [facility] None  02/26/2020 11:30 AM Garnet Sierras, NP [facility] None  03/13/2020 11:30 AM [facility] None  03/13/2020 12:00 PM Brunetta Genera, MD [facility] None  03/26/2020 11:00 AM Liane Comber, NP [facility] None  04/30/2020  4:00 PM [name], MD [facility] None  06/07/2020 12:30 PM Hayden Pedro, MD [facility] None  10/23/2020 11:00 AM [name], MD [facility] None  01/21/2021 11:15 AM Liane Comber, NP [facility] None     Plan:   During the course of the visit the patient was educated and  counseled about appropriate screening and preventive services including:    Pneumococcal vaccine   Influenza vaccine  Prevnar 13  Td vaccine  Screening electrocardiogram  Colorectal cancer screening  Diabetes screening  Glaucoma screening  Nutrition counseling    Subjective:  [name]. is a 80 y.o. male who presents for Medicare Annual Wellness Visit and 3 month follow up for HTN, CKD, weight and pfib coumadin management, T2DM, hyperlipidemia.   He requests uric acid be drawn today and forwarded to [name].   He has a complicated heart history, had ASCAD and pfib/flutter in Apr 2015 with subsequent CABG with MAZE. 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. Weight is stable.    He has a history of Diastolic CHF (last ECHO 88/3254 LVEF 60-65%) Denies orthopnea, paroxysmal nocturnal dyspnea and edema. Positive for exertional dyspnea (taking out trash cans, etc). Consistent with baseline. He checks weight daily and transmits to insurance. He takes 150 mg torsemide in AM. Has PRN metolazone 5 mg but hasn't needed in several months.  Wt Readings from Last 3 Encounters:  01/01/20 193 lb 3.2 oz (87.6 kg)  12/18/19 188 lb 12.8 oz (85.6 kg)  11/13/19 187 lb (84.8 kg)   He is on xanax as needed for sleep and zoloft for depression/anxiety which is in remission.   BMI is There is no height or weight on file to calculate BMI., he has been working on diet; admits exercise is limited with current cold weather, avoiding gym with current pandemic.  Wt Readings from Last 3 Encounters:  01/01/20 193 lb 3.2 oz (87.6 kg)  12/18/19 188 lb 12.8 oz (85.6 kg)  11/13/19 187 lb (84.8 kg)   His blood pressure has been controlled at home, today their BP is   He does  workout, walks and works out in the yard. He denies chest pain, shortness of breath, dizziness.   He is still on coumadin at this time, denies any bleeding, black stools, etc.  Previously on Coumadin 5 mg x 1.5 tab = 7.5 mg /day for 2 weeks   Dr. Melford Aase recommended he increase coumadin 5 mg x 2 tabs = 10 mg  /day for next 4 days , then cut back to  1.5 tab = 7.5 mg    qod - even days    & 2 tabs = 10 mg     qod   - odd days   Currently taking ***  Lab Results  Component Value Date   INR 1.3 (H) 01/01/2020   INR 1.8 (H) 12/18/2019   INR 2.3 (H) 11/13/2019   Recently followed by Dr Melchor Amour has had iron infusion forChronic Anemia.  CBC Latest Ref Rng & Units 12/18/2019 11/21/2019 11/13/2019  WBC 3.8 - 10.8 Thousand/uL 8.2 6.1 8.5  Hemoglobin 13.2 - 17.1 g/dL 11.1(L) 10.1(L) 10.4(L)  Hematocrit 38.5 - 50.0 % 34.6(L) 31.8(L) 32.0(L)  Platelets 140 - 400 Thousand/uL 226 172 209      His blood pressure {HAS HAS NOT:18834} been controlled at home, today their BP is    He {DOES_DOES DIY:64158} workout. He denies chest pain, shortness of breath, dizziness.   He {ACTION; IS/IS XEN:40768088} on cholesterol medication and denies myalgias. His cholesterol {ACTION; IS/IS NOT:21021397} at goal. The cholesterol last visit was:   Lab Results  Component Value Date   CHOL 152 10/11/2019   HDL 28 (  L) 10/11/2019   LDLCALC 94 10/11/2019   TRIG 198 (H) 10/11/2019   CHOLHDL 5.4 (H) 10/11/2019    He {Has/has not:18111} been working on diet and exercise for prediabetes, and denies {Symptoms; diabetes w/o none:19199}. 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 Last GFR:  Lab Results  Component Value Date   GFRNONAA 32 (L) 10/11/2019   GFRNONAA 34 (L) 09/13/2019   GFRNONAA 36 (L) 08/03/2019   Patient is on Vitamin D supplement.   Lab Results  Component Value  Date   VD25OH 62 10/11/2019     Patient is on allopurinol for gout and {Action; does/does not:19097} report a recent flare.  Lab Results  Component Value Date   LABURIC 6.4 10/11/2019      Medication Review: Current Outpatient Medications on File Prior to Visit  Medication Sig  . ACCU-CHEK AVIVA PLUS test strip CHECK BLOOD GLUCOSE 3 TIMES DAILY  . acetaminophen (TYLENOL) 500 MG tablet Take 1,000 mg by mouth every 6 (six) hours as needed for moderate pain or headache.   . allopurinol (ZYLOPRIM) 300 MG tablet Take 150 mg by mouth daily.  [name] ALPRAZolam (XANAX) 1 MG tablet Take 0.5 tablets (0.5 mg total) by mouth at bedtime as needed for sleep.  [name] Alum Hydroxide-Mag Carbonate (GAVISCON PO) Take 4 tablets by mouth daily as needed (acid reflux).   [name] aspirin EC 81 MG tablet Take 81 mg by mouth daily.  [name] atorvastatin (LIPITOR) 20 MG tablet Take      1 tablet       3 x /week       for Cholesterol  . azelastine (ASTELIN) 0.1 % nasal spray Place 2 sprays into both nostrils 2 (two) times daily as needed for rhinitis or allergies.  . B Complex Vitamins (VITAMIN B COMPLEX PO) Take 1 tablet by mouth at bedtime.   . bacitracin 500 UNIT/GM ointment Apply 1 application topically daily as needed for wound care.  . Blood Glucose Monitoring Suppl (ACCU-CHEK AVIVA PLUS) w/Device KIT Check blood sugar 1 time  daily  . Blood Glucose Monitoring Suppl DEVI Test blood sugar up to three times a day or as directed.  . carvedilol (COREG) 6.25 MG tablet Take 0.5 tablets (3.125 mg total) by mouth in the morning AND 1 tablet (6.25 mg total) every evening.  . Cholecalciferol (VITAMIN D PO) Take 5,000 Units by mouth daily.   . diphenhydrAMINE (BENADRYL) 25 MG tablet Take 25 mg by mouth at bedtime as needed for allergies.  . fluticasone (FLONASE) 50 MCG/ACT nasal spray Place 1 spray into both nostrils daily as needed for allergies.  [name] insulin NPH-regular Human (70-30) 100 UNIT/ML injection Inject 25-45 Units into the skin  See admin instructions. 45 units in the morning and 30 units in the evening  . loratadine (CLARITIN) 10 MG tablet Take 10 mg by mouth daily as needed for allergies.   . metolazone (ZAROXOLYN) 2.5 MG tablet TAKE 1 TABLET BY MOUTH  DAILY AS NEEDED FOR  SWELLING (Patient taking differently: Take 2.5 mg by mouth daily as needed (swelling).)  . Multiple Vitamins-Minerals (PRESERVISION AREDS 2) CAPS Take 1 capsule by mouth 2 (two) times daily.   . polyvinyl alcohol (LIQUIFILM TEARS) 1.4 % ophthalmic solution Place 1 drop into both eyes daily as needed for dry eyes.  . potassium chloride SA (KLOR-CON) 20  MEQ tablet Take      2 tablets       3 x /day       for Potassium  . sertraline (ZOLOFT) 100 MG tablet Take 1 tablet Daily for Mood (Patient taking differently: Take 100 mg by mouth daily. Take 1 tablet Daily for Mood)  . torsemide (DEMADEX) 100 MG tablet Take 150 mg by mouth daily.  [name] warfarin (COUMADIN) 5 MG tablet Take    1.5 tablets /day      or as directed   No current facility-administered medications on file prior to visit.    Allergies: Allergies  Allergen Reactions  . Sunflower Seed [Sunflower Oil] Swelling and Other (See Comments)    Tongue and lip swelling  . Horse-Derived Products Other (See Comments)    Per allergy skin test UNSPECIFIED REACTION   . Tetanus Toxoids Other (See Comments)    Per allergy skin test  . 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; Type 2 diabetes mellitus with stage 4 chronic kidney disease, with long-term current use of insulin ([place]); GERD; DJD (degenerative joint disease); Vitamin D deficiency; Chronic  anticoagulation (INR goal 2.0-2.5); Essential hypertension; CAD (coronary artery disease); PFO (patent foramen ovale); 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[place]); OSA on CPAP; S/P Maze operation for atrial fibrillation; Pulmonary hypertension ([place]); 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  Screening Tests Immunization History  Administered Date(s) Administered  . DT (Pediatric) 08/05/2015  . Influenza Split 10/25/2012  . Influenza, High Dose Seasonal PF 09/26/2013, 09/27/2014, 10/03/2015, 10/05/2016, 11/11/2017, 10/10/2018, 10/11/2019  . PFIZER SARS-COV-2 Vaccination 01/26/2019, 02/15/2019  . Pneumococcal Conjugate-13 01/30/2014  . Pneumococcal Polysaccharide-23 10/20/2011  . Td 01/06/2000   Preventative care: Last colonoscopy: 01/26/2017,  3 year recall?  EGD: 01/26/2017  Prior vaccinations: TD or Tdap: 2002 Influenza: 10/2018 Pneumococcal: 2013 Prevnar13: 2016 Shingles/Zostavax: undecided  Names of Other Physician/Practitioners you currently use: 1. [facility] here for primary care 2. Dr. Zigmund Daniel, eye doctor, last visit 09/30/2018 - report received and abstracted - retinopathy  3. Dr. Johnn Hai, dentist, last visit 2020, goes q55m Patient Care Team: MUnk Pinto MD as PCP - General (Internal Medicine) KInda Castle MD (Inactive) as Consulting Physician (Gastroenterology) JMartinique Peter M, MD as Consulting Physician (Cardiology) ORexene Alberts MD as Consulting Physician (Cardiothoracic Surgery) Szott, MPrescott Gum DDS as Referring Physician MHayden Pedro MD as Consulting Physician (Ophthalmology) TLavonna Monarch MD as  Consulting Physician (Dermatology) NJosue Hector MD as Consulting Physician (Cardiology) AThompson Grayer MD as Consulting Physician (Cardiology) BRosita Fire MD as Consulting Physician (Nephrology) Bensimhon, DShaune Pascal MD as Consulting Physician (Cardiology)  Surgical: He  has a past surgical history that includes Polinydal cyst; Great toe arthrodesis, interphalangeal joint; Retina repair-right; Excision basal cell carcinoma; Polypectomy; Cardiac catheterization; Coronary artery bypass graft (N/A, 04/05/2013); MAZE (N/A, 04/05/2013); Intraoprative transesophageal echocardiogram (N/A, 04/05/2013); TEE without cardioversion (N/A, 08/23/2013); Cardioversion (N/A, 08/23/2013); TEE without cardioversion (N/A, 10/26/2013); left  heart catheterization with coronary angiogram (N/A, 03/07/2013); atrial fibrillation ablation (N/A, 10/26/2013); Carpometacarpel [facility]) suspension plasty (Left, 02/14/2014); Trapezium resection; right heart catheterization (N/A, 05/03/2014); TEE without cardioversion (N/A, 06/05/2014); Esophagogastroduodenoscopy (egd) with propofol (N/A, 01/26/2017); Colonoscopy with propofol (N/A, 01/26/2017); Appendectomy (03/05/2017); laparoscopic appendectomy (N/A, 03/05/2017); Cataract extraction (Bilateral); Eye surgery (Bilateral); Eye surgery (Right); Retinal detachment surgery (Right); and RIGHT HEART CATH (N/A, 07/06/2019). Family His family history includes Atrial fibrillation in his mother; Colon cancer in his mother; Colon polyps in his mother and sister; Dementia in his father; Diabetes in his maternal uncle; Hypertension in his mother; Stroke in his paternal uncle. Social history  He reports that he quit smoking about 39 years ago. His smoking use included cigarettes. He has a 100.00 pack-year smoking history. He has never used smokeless tobacco. He reports current alcohol use of about 1.0 standard drink of alcohol per week. He reports that he does not use drugs.  MEDICARE WELLNESS  OBJECTIVES: Physical activity:   Cardiac risk factors:   Depression/mood screen:   Depression screen [facility] 2/9 12/18/2019  Decreased Interest 0  Down, Depressed, Hopeless 0  PHQ - 2 Score 0  Altered sleeping -  Tired, decreased energy -  Change in appetite -  Feeling bad or failure about yourself  -  Trouble concentrating -  Moving slowly or fidgety/restless -  Suicidal thoughts -  PHQ-9 Score -  Difficult doing work/chores -  Some recent data might be hidden    ADLs:  In your present state of health, do you have any difficulty performing the following activities: 12/18/2019 10/10/2019  Hearing? N N  Vision? N N  Difficulty concentrating or making decisions? N N  Walking or climbing stairs? N N  Dressing or bathing? N N  Doing errands, shopping? N N  Some recent data might be hidden     Cognitive Testing  Alert? Yes  Normal Appearance?Yes  Oriented to person? Yes  Place? Yes   Time? Yes  Recall of three objects?  Yes  Can perform simple calculations? Yes  Displays appropriate judgment?Yes  Can read the correct time from a watch face?Yes  EOL planning:    ***  Review of Systems  Constitutional: Negative for chills, fever, malaise/fatigue and weight loss.  HENT: Negative.  Negative for hearing loss and tinnitus.   Eyes: Negative for blurred vision and double vision.  Respiratory: Negative for cough, sputum production, shortness of breath (exertional with cold weather or weight bearing exertion, consistent with baseline ) and wheezing.   Cardiovascular: Negative for chest pain, palpitations, orthopnea, claudication, leg swelling and PND.  Gastrointestinal: Negative.  Negative for abdominal pain, blood in stool, constipation, diarrhea, heartburn, melena, nausea and vomiting.  Genitourinary: Negative.  Negative for dysuria, flank pain, frequency, hematuria and urgency.  Musculoskeletal: Negative.  Negative for falls, joint pain and myalgias.  Skin: Negative.  Negative for  rash.  Neurological: Negative for dizziness, tingling, tremors, sensory change, speech change, focal weakness, seizures, loss of consciousness, weakness and headaches.  Endo/Heme/Allergies: Negative for polydipsia.  Psychiatric/Behavioral: Negative.  Negative for depression, memory loss, substance abuse and suicidal ideas. The patient is not nervous/anxious and does not have insomnia.   All other systems reviewed and are negative.    Objective:   There were no vitals filed for this visit. There is no height or weight on file to calculate BMI.  General appearance: alert, no distress, WD/WN, male HEENT: normocephalic, sclerae anicteric, TMs pearly, nares patent, no discharge or erythema, pharynx normal Oral  cavity: mask in place; deferred  Neck: supple, no lymphadenopathy, no thyromegaly, no masses Heart: RRR, normal S1, S2, no murmurs Lungs: CTA bilaterally, no wheezes, rhonchi, or rales Abdomen: +bs, soft, non tender, non distended, no masses, no hepatomegaly, no splenomegaly Musculoskeletal: nontender, no swelling, no obvious deformity Extremities: no edema, no cyanosis, no clubbing Pulses: 2+ symmetric, upper and lower extremities, normal cap refill Skin: warm/dry, intact; no rashes, concerning lesions; he has scattered small ecchymoses to bil upper extremities Neurological: alert, oriented x 3 strength normal upper extremities and lower extremities, sensation normal throughout, DTRs 2+ throughout, no cerebellar signs, gait normal Psychiatric: normal affect, behavior normal, pleasant   Medicare Attestation I have personally reviewed: The patient's medical and social history Their use of alcohol, tobacco or illicit drugs Their current medications and supplements The patient's functional ability including ADLs,fall risks, home safety risks, cognitive, and hearing and visual impairment Diet and physical activities Evidence for depression or mood disorders  The patient's weight,  height, BMI, and visual acuity have been recorded in the chart.  I have made referrals, counseling, and provided education to the patient based on review of the above and I have provided the patient with a written personalized care plan for preventive services.     Izora Ribas, NP   01/19/2020

## 2020-01-22 ENCOUNTER — Ambulatory Visit: Payer: Medicare Other | Admitting: Adult Health

## 2020-01-22 DIAGNOSIS — Z0000 Encounter for general adult medical examination without abnormal findings: Secondary | ICD-10-CM

## 2020-02-05 ENCOUNTER — Encounter: Payer: Self-pay | Admitting: Internal Medicine

## 2020-02-05 NOTE — Progress Notes (Signed)
History of Present Illness:      This very nice 80 yo MWM is  followed for HTN, ASHD/CABG, chAfib, Pulm. HTN, COPD, HLD, T2_NIDDM , COPDand Vitamin D Deficiency.       Patient presents today c/o gradual worsening dyspnea on exertion over the last 4 weeks. His weight is up 2 # over the last 2 months. He denies any orthopnea or PND type sx's. He reports his O2 sat's at home range 93-99%. He is on CPAP. He did have home O2 but relinquished it some time ago.         Patient is s/p CABG w MAZE procedure and has hx/ RF Ablations for Afib & Flutter is followed on Coumadin for chronic Afib.        Patient is on Atorvastatin for HLD with LDL not at goal (<70) and also with elevated Trig's:  Lab Results  Component Value Date   CHOL 152 10/11/2019   HDL 28 (L) 10/11/2019   LDLCALC 94 10/11/2019   TRIG 198 (H) 10/11/2019   CHOLHDL 5.4 (H) 10/11/2019             Patient has hx/o T2_NIDDM (1995) /CKD 4  and patient denies reactive hypoglycemic symptoms, visual blurring, diabetic polys or paresthesias.  Patient is onNovolin 70/30 bid and last A1c was not at goal:  Lab Results  Component Value Date   HGBA1C 7.7 (H) 10/11/2019                                           Finally, patient has history of Vitamin D Deficiency ("39" /2008) and last vitamin D was  at goal:  Last vitamin D Lab Results  Component Value Date   VD25OH 62 10/11/2019    Medications  Current Outpatient Medications (Endocrine & Metabolic):  .  insulin NPH-regular Human (70-30) 100 UNIT/ML injection, Inject 25-45 Units into the skin See admin instructions. 45 units in the morning and 30 units in the evening  Current Outpatient Medications (Cardiovascular):  .  atorvastatin (LIPITOR) 20 MG tablet, Take      1 tablet       3 x /week       for Cholesterol .  carvedilol (COREG) 6.25 MG tablet, Take 0.5 tablets (3.125 mg total) by mouth in the morning AND 1 tablet (6.25 mg total) every evening. .  metolazone (ZAROXOLYN)  2.5 MG tablet, TAKE 1 TABLET BY MOUTH  DAILY AS NEEDED FOR  SWELLING (Patient taking differently: Take 2.5 mg by mouth daily as needed (swelling).) .  torsemide (DEMADEX) 100 MG tablet, Take 150 mg by mouth daily.  Current Outpatient Medications (Respiratory):  .  azelastine (ASTELIN) 0.1 % nasal spray, Place 2 sprays into both nostrils 2 (two) times daily as needed for rhinitis or allergies. .  diphenhydrAMINE (BENADRYL) 25 MG tablet, Take 25 mg by mouth at bedtime as needed for allergies. .  fluticasone (FLONASE) 50 MCG/ACT nasal spray, Place 1 spray into both nostrils daily as needed for allergies. [name]  loratadine (CLARITIN) 10 MG tablet, Take 10 mg by mouth daily as needed for allergies.   Current Outpatient Medications (Analgesics):  .  acetaminophen (TYLENOL) 500 MG tablet, Take 1,000 mg by mouth every 6 (six) hours as needed for moderate pain or headache.  .  allopurinol (ZYLOPRIM) 300 MG tablet, Take 150 mg by mouth daily. [name]  aspirin  EC 81 MG tablet, Take 81 mg by mouth daily.  Current Outpatient Medications (Hematological):  .  warfarin (COUMADIN) 5 MG tablet, Take    1.5 tablets /day      or as directed  Current Outpatient Medications (Other):  [name]  ACCU-CHEK AVIVA PLUS test strip, CHECK BLOOD GLUCOSE 3 TIMES DAILY .  ALPRAZolam (XANAX) 1 MG tablet, Take 0.5 tablets (0.5 mg total) by mouth at bedtime as needed for sleep. [name]  Alum Hydroxide-Mag Carbonate (GAVISCON PO), Take 4 tablets by mouth daily as needed (acid reflux).  .  B Complex Vitamins (VITAMIN B COMPLEX PO), Take 1 tablet by mouth at bedtime.  .  bacitracin 500 UNIT/GM ointment, Apply 1 application topically daily as needed for wound care. .  Blood Glucose Monitoring Suppl (ACCU-CHEK AVIVA PLUS) w/Device KIT, Check blood sugar 1 time  daily .  Blood Glucose Monitoring Suppl DEVI, Test blood sugar up to three times a day or as directed. .  Cholecalciferol (VITAMIN D PO), Take 5,000 Units by mouth daily.  .  Multiple  Vitamins-Minerals (PRESERVISION AREDS 2) CAPS, Take 1 capsule by mouth 2 (two) times daily.  .  polyvinyl alcohol (LIQUIFILM TEARS) 1.4 % ophthalmic solution, Place 1 drop into both eyes daily as needed for dry eyes. .  potassium chloride SA (KLOR-CON) 20 MEQ tablet, Take      2 tablets       3 x /day       for Potassium .  sertraline (ZOLOFT) 100 MG tablet, Take 1 tablet Daily for Mood (Patient taking differently: Take 100 mg by mouth daily. 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; Type 2 diabetes mellitus with stage 4 chronic kidney disease, with long-term current use of insulin ([place]); GERD; DJD (degenerative joint disease); Vitamin D deficiency; Chronic anticoagulation (INR goal 2.0-2.5); Essential hypertension; CAD (coronary artery disease); PFO (patent foramen ovale); 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[place]); OSA on CPAP; S/P Maze operation for atrial fibrillation; Pulmonary hypertension ([place]); 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   Observations/Objective:  BP 118/62   P 88   T 96.9 F    Resp 16   Ht _0    Wt 190 lb   BMI 26.53 / O2 sat 99% at rest  Patient ambulated about 100 ' in 5he outside hall & O2 sat remained stable at 95%, altho, patient become dyspneic.   HEENT - WNL. Neck - supple.  Chest -   BS equal,  distant & clear. Cor - Soft HS. RRR w/o sig MGR. PP 1(+). Trace ankle  edema.  MS- FROM w/o deformities.  Gait Nl. Neuro -  Nl w/o focal abnormalities.  Assessment and Plan:  1. Essential hypertension \ - CBC with Differential/Platelet - COMPLETE METABOLIC PANEL WITH GFR - Magnesium - TSH  2. Hyperlipidemia associated with type 2 diabetes mellitus (H[facility])  - Lipid panel - TSH  3. Type 2 diabetes mellitus with stage 4 chronic kidney disease, with long-term current use of insulin ([place])  -  COMPLETE METABOLIC PANEL WITH GFR - Hemoglobin A1c  4. Vitamin D deficiency  - VITAMIN D 25 Hydroxy  5. Chronic a-fib ([place])  - Protime-INR  6. Chronic anticoagulation  - Protime-INR pe 7. Iron deficiency anemia  - CBC with Differential/Platelet  8. Medication management  - CBC with Differential/Platelet - COMPLETE METABOLIC PANEL WITH GFR - Magnesium - Lipid panel - TSH - Hemoglobin A1c - VITAMIN D 25 Hydroxy    Follow Up Instructions:       Patient presents with gradual onset of worsening dyspnea & fatigue. Will screen data base and make disposition pending results.       I discussed the assessment and treatment plan with the patient. The patient was provided an opportunity to ask questions and all were answered. The patient agreed with the plan and demonstrated an understanding of the instructions.       The patient was advised to call back or seek an in-person evaluation if the symptoms worsen or if the condition fails to improve as anticipated.   Kirtland Bouchard, MD

## 2020-02-05 NOTE — Patient Instructions (Signed)
Due to recent changes in healthcare laws, you may see the results of your imaging and laboratory studies on MyChart before your provider has had a chance to review them.  We understand that in some cases there may be results that are confusing or concerning to you. Not all laboratory results come back in the same time frame and the provider may be waiting for multiple results in order to interpret others.  Please give us 48 hours in order for your provider to thoroughly review all the results before contacting the office for clarification of your results.     ++++++++++++++++++++++++++++++++++  Vit D  & Vit C 1,000 mg   are recommended to help protect  against the Covid-19 and other Corona viruses.    Also it's recommended  to take  Zinc 50 mg  to help  protect against the Covid-19   and best place to get  is also on [employer]  and don't pay more than 6-8 cents /pill !   ===================================== Coronavirus (COVID-19) Are you at risk?  Are you at risk for the Coronavirus (COVID-19)?  To be considered HIGH RISK for Coronavirus (COVID-19), you have to meet the following criteria:  . Traveled to [place], [place], [place], [place] or [place]; or in the [place] to [place], [place], [place]  . or [place]; and have fever, cough, and shortness of breath within the last 2 weeks of travel OR . Been in close contact with a person diagnosed with COVID-19 within the last 2 weeks and have  . fever, cough,and shortness of breath .  . IF YOU DO NOT MEET THESE CRITERIA, YOU ARE CONSIDERED LOW RISK FOR COVID-19.  What to do if you are HIGH RISK for COVID-19?  . If you are having a medical emergency, call 911. . Seek medical care right away. Before you go to a doctor's office, urgent care or emergency department, .  call ahead and tell them about your recent travel, contact with someone diagnosed with COVID-19  .  and your symptoms.  . You should receive instructions  from your physician's office regarding next steps of care.  . When you arrive at healthcare provider, tell the healthcare staff immediately you have returned from  . visiting [place], [place], [place], [place] or [place]; or traveled in the [place] to [place], [place],  . [place] or [place] in the last two weeks or you have been in close contact with a person diagnosed with  . COVID-19 in the last 2 weeks.   . Tell the health care staff about your symptoms: fever, cough and shortness of breath. . After you have been seen by a medical provider, you will be either: o Tested for (COVID-19) and discharged home on quarantine except to seek medical care if  o symptoms worsen, and asked to  - Stay home and avoid contact with others until you get your results (4-5 days)  - Avoid travel on public transportation if possible (such as bus, train, or airplane) or o Sent to the Emergency Department by EMS for evaluation, COVID-19 testing  and  o possible admission depending on your condition and test results.  What to do if you are LOW RISK for COVID-19?  Reduce your risk of any infection by using the same precautions used for avoiding the common cold or flu:  . Wash your hands often with soap and warm water for at least 20 seconds.  If soap and water   are not readily available,  . use an alcohol-based hand sanitizer with at least 60% alcohol.  . If coughing or sneezing, cover your mouth and nose by coughing or sneezing into the elbow areas of your shirt or coat, .  into a tissue or into your sleeve (not your hands). . Avoid shaking hands with others and consider head nods or verbal greetings only. . Avoid touching your eyes, nose, or mouth with unwashed hands.  . Avoid close contact with people who are sick. . Avoid places or events with large numbers of people in one location, like concerts or sporting events. . Carefully consider travel plans you have or are making. . If you are planning  any travel outside or inside the [place], visit the CDC's Travelers' Health webpage for the latest health notices. . If you have some symptoms but not all symptoms, continue to monitor at home and seek medical attention  . if your symptoms worsen. . If you are having a medical emergency, call 911.   ++++++++++++++++++++++++++++++++ Recommend Adult Low Dose Aspirin or  coated  Aspirin 81 mg daily  To reduce risk of Colon Cancer 40 %,  Skin Cancer 26 % ,  Melanoma 46%  and  Pancreatic cancer 60% ++++++++++++++++++++++++++++++++ Vitamin D goal  is between 70-100.  Please make sure that you are taking your Vitamin D as directed.  It is very important as a natural anti-inflammatory  helping hair, skin, and nails, as well as reducing stroke and heart attack risk.  It helps your bones and helps with mood. It also decreases numerous cancer risks so please take it as directed.  Low Vit D is associated with a 200-300% higher risk for CANCER  and 200-300% higher risk for HEART   ATTACK  &  STROKE.   ...................................... It is also associated with higher death rate at younger ages,  autoimmune diseases like Rheumatoid arthritis, Lupus, Multiple Sclerosis.    Also many other serious conditions, like depression, Alzheimer's Dementia, infertility, muscle aches, fatigue, fibromyalgia - just to name a few. ++++++++++++++++++++ Recommend the book "The END of DIETING" by Dr Joel Fuhrman  & the book "The END of DIABETES " by Dr Joel Fuhrman At [employer] - get book & Audio CD's    Being diabetic has a  300% increased risk for heart attack, stroke, cancer, and alzheimer- type vascular dementia. It is very important that you work harder with diet by avoiding all foods that are white. Avoid white rice (brown & wild rice is OK), white potatoes (sweetpotatoes in moderation is OK), White bread or wheat bread or anything made out of white flour like bagels, donuts, rolls, buns, biscuits, cakes,  pastries, cookies, pizza crust, and pasta (made from white flour & egg whites) - vegetarian pasta or spinach or wheat pasta is OK. Multigrain breads like Arnold's or Pepperidge [employer], or multigrain sandwich thins or flatbreads.  Diet, exercise and weight loss can reverse and cure diabetes in the early stages.  Diet, exercise and weight loss is very important in the control and prevention of complications of diabetes which affects every system in your body, ie. Brain - dementia/stroke, eyes - glaucoma/blindness, heart - heart attack/heart failure, kidneys - dialysis, stomach - gastric paralysis, intestines - malabsorption, nerves - severe painful neuritis, circulation - gangrene & loss of a leg(s), and finally cancer and Alzheimers.    I recommend avoid fried & greasy foods,  sweets/candy, white rice (brown or wild rice or Quinoa is OK), white potatoes (  sweet potatoes are OK) - anything made from white flour - bagels, doughnuts, rolls, buns, biscuits,white and wheat breads, pizza crust and traditional pasta made of white flour & egg white(vegetarian pasta or spinach or wheat pasta is OK).  Multi-grain bread is OK - like multi-grain flat bread or sandwich thins. Avoid alcohol in excess. Exercise is also important.    Eat all the vegetables you want - avoid meat, especially red meat and dairy - especially cheese.  Cheese is the most concentrated form of trans-fats which is the worst thing to clog up our arteries. Veggie cheese is OK which can be found in the fresh produce section at [employer] or [employer] or [employer]  +++++++++++++++++++++ DASH Eating Plan  DASH stands for "Dietary Approaches to Stop Hypertension."   The DASH eating plan is a healthy eating plan that has been shown to reduce high blood pressure (hypertension). Additional health benefits may include reducing the risk of type 2 diabetes mellitus, heart disease, and stroke. The DASH eating plan may also help with weight loss. WHAT DO I  NEED TO KNOW ABOUT THE DASH EATING PLAN? For the DASH eating plan, you will follow these general guidelines:  Choose foods with a percent daily value for sodium of less than 5% (as listed on the food label).  Use salt-free seasonings or herbs instead of table salt or sea salt.  Check with your health care provider or pharmacist before using salt substitutes.  Eat lower-sodium products, often labeled as "lower sodium" or "no salt added."  Eat fresh foods.  Eat more vegetables, fruits, and low-fat dairy products.  Choose whole grains. Look for the word "whole" as the first word in the ingredient list.  Choose fish   Limit sweets, desserts, sugars, and sugary drinks.  Choose heart-healthy fats.  Eat veggie cheese   Eat more home-cooked food and less restaurant, buffet, and fast food.  Limit fried foods.  Cook foods using methods other than frying.  Limit canned vegetables. If you do use them, rinse them well to decrease the sodium.  When eating at a restaurant, ask that your food be prepared with less salt, or no salt if possible.                      WHAT FOODS CAN I EAT? Read Dr Joel Fuhrman's books on The End of Dieting & The End of Diabetes  Grains Whole grain or whole wheat bread. Brown rice. Whole grain or whole wheat pasta. Quinoa, bulgur, and whole grain cereals. Low-sodium cereals. Corn or whole wheat flour tortillas. Whole grain cornbread. Whole grain crackers. Low-sodium crackers.  Vegetables Fresh or frozen vegetables (raw, steamed, roasted, or grilled). Low-sodium or reduced-sodium tomato and vegetable juices. Low-sodium or reduced-sodium tomato sauce and paste. Low-sodium or reduced-sodium canned vegetables.   Fruits All fresh, canned (in natural juice), or frozen fruits.  Protein Products  All fish and seafood.  Dried beans, peas, or lentils. Unsalted nuts and seeds. Unsalted canned beans.  Dairy Low-fat dairy products, such as skim or 1% milk, 2% or  reduced-fat cheeses, low-fat ricotta or cottage cheese, or plain low-fat yogurt. Low-sodium or reduced-sodium cheeses.  Fats and Oils Tub margarines without trans fats. Light or reduced-fat mayonnaise and salad dressings (reduced sodium). Avocado. Safflower, olive, or canola oils. Natural peanut or almond butter.  Other Unsalted popcorn and pretzels. The items listed above may not be a complete list of recommended foods or beverages. Contact your dietitian for more   options.  +++++++++++++++  WHAT FOODS ARE NOT RECOMMENDED? Grains/ White flour or wheat flour White bread. White pasta. White rice. Refined cornbread. Bagels and croissants. Crackers that contain trans fat.  Vegetables  Creamed or fried vegetables. Vegetables in a . Regular canned vegetables. Regular canned tomato sauce and paste. Regular tomato and vegetable juices.  Fruits Dried fruits. Canned fruit in light or heavy syrup. Fruit juice.  Meat and Other Protein Products Meat in general - RED meat & White meat.  Fatty cuts of meat. Ribs, chicken wings, all processed meats as bacon, sausage, bologna, salami, fatback, hot dogs, bratwurst and packaged luncheon meats.  Dairy Whole or 2% milk, cream, half-and-half, and cream cheese. Whole-fat or sweetened yogurt. Full-fat cheeses or blue cheese. Non-dairy creamers and whipped toppings. Processed cheese, cheese spreads, or cheese curds.  Condiments Onion and garlic salt, seasoned salt, table salt, and sea salt. Canned and packaged gravies. Worcestershire sauce. Tartar sauce. Barbecue sauce. Teriyaki sauce. Soy sauce, including reduced sodium. Steak sauce. Fish sauce. Oyster sauce. Cocktail sauce. Horseradish. Ketchup and mustard. Meat flavorings and tenderizers. Bouillon cubes. Hot sauce. Tabasco sauce. Marinades. Taco seasonings. Relishes.  Fats and Oils Butter, stick margarine, lard, shortening and bacon fat. Coconut, palm kernel, or palm oils. Regular salad  dressings.  Pickles and olives. Salted popcorn and pretzels.  The items listed above may not be a complete list of foods and beverages to avoid.  ++++++++++++++++++++++++++++++++++ Bleeding Precautions  When on Anticoagulant Therapy  Anticoagulant therapy, also called blood thinner therapy, is medicine that helps to prevent and treat blood clots. The medicine works by stopping blood clots from forming or growing. Blood clots that form in your blood vessels can be dangerous. They can break loose and travel to the heart, lungs, or brain. This increases the risk of a heart attack, stroke, or blocked lung artery (pulmonary embolism). Anticoagulants also increase the risk of bleeding. Try to protect yourself from cuts and other injuries that can cause bleeding. It is important to take anticoagulants exactly as told by your health care provider. Why do I need to be on anticoagulant therapy? You may need this medicine if you are at risk of developing a blood clot. Conditions that increase your risk of a blood clot include:  Being born with heart disease or a heart malformation (congenital heart disease).  Developing heart disease.  Having had surgery, such as valve replacement.  Having had a serious accident or other type of severe injury (trauma).  Having certain types of cancer.  Having certain diseases that can increase blood clotting.  Having a high risk of stroke or heart attack.  Having atrial fibrillation (AF). What are the common anticoagulant medicines? There are several types of anticoagulant medicines. The most common types are:  Medicines that you take by mouth (oral medicines), such as: ? Warfarin. ? Novel oral anticoagulants (NOACs), such as:  Direct thrombin inhibitors (dabigatran).  Factor Xa inhibitors (apixaban, edoxaban, and rivaroxaban).  Injections, such as: ? Unfractionated heparin. ? Low molecular weight heparin. These anticoagulants work in different ways  to prevent blood clots. They also have different risks and side effects. What do I need to remember while on anticoagulant therapy? Taking anticoagulants  Take your medicine at the same time every day. If you forget to take your medicine, take it as soon as you remember. Do not double your dosage of medicine if you miss a whole day. Take your normal dose and call your health care provider.  Do not stop  taking your medicine unless your health care provider approves. Stopping the medicine can increase your risk of developing a blood clot. Taking other medicines  Take over-the-counter and prescriptions medicines only as told by your health care provider.  Do not take over-the-counter NSAIDs, including aspirin and ibuprofen, while you are on anticoagulant therapy. These medicines increase your risk of dangerous bleeding.  Get approval from your health care provider before you start taking any new medicines, vitamins, or herbal products. Some of these could interfere with your therapy. General instructions  Keep all follow-up visits as told by your health care provider. This is important.  If you are pregnant or trying to get pregnant, talk with a health care provider about anticoagulants. Some of these medicines are not safe to take during pregnancy.  Tell all health care providers, including your dentist, that you are on anticoagulant therapy. It is especially important to tell providers before you have any surgery, medical procedures, or dental work done.   What other precautions are important if on warfarin therapy? If you are taking a type of anticoagulant called warfarin, make sure you:  Work with a diet and nutrition specialist (dietitian) to make an eating plan. Do not make any sudden changes to your diet after you have started your eating plan.  Do not drink alcohol. It can interfere with your medicine and increase your risk of an injury that causes bleeding.  Get regular blood tests  as told by your health care provider.  What are some questions to ask my health care provider?  Why do I need anticoagulant therapy?  What is the best anticoagulant therapy for my condition?  How long will I need anticoagulant therapy?  What are the side effects of anticoagulant therapy?  When should I take my medicine? What should I do if I forget to take it?  Will I need to have regular blood tests?  Do I need to change my diet? Are there foods or drinks that I should avoid?  What activities are safe for me?  What should I do if I want to get pregnant? Contact a health care provider if:  You miss a dose of medicine: ? And you are not sure what to do. ? For more than one day.  You have: ? Menstrual bleeding that is heavier than normal. ? Bloody or brown urine. ? Easy bruising. ? Black and tarry stool or bright red stool. ? Side effects from your medicine.  You feel weak or dizzy.  You become pregnant. Get help right away if:  You have bleeding that will not stop within 20 minutes from: ? The nose. ? The gums. ? A cut on the skin.  You have a severe headache or stomachache.  You vomit or cough up blood.  You fall or hit your head. Summary  Anticoagulant therapy, also called blood thinner therapy, is medicine that helps to prevent and treat blood clots.  Anticoagulants work in different ways to prevent blood clots. They also have different risks and side effects.  Talk with your health care provider about any precautions that you should take while on anticoagulant therapy.

## 2020-02-06 ENCOUNTER — Ambulatory Visit
Admission: RE | Admit: 2020-02-06 | Discharge: 2020-02-06 | Disposition: A | Discharge status: Home / Self Care | Payer: Medicare Other | Source: Ambulatory Visit | Attending: Internal Medicine | Admitting: Internal Medicine

## 2020-02-06 ENCOUNTER — Ambulatory Visit (INDEPENDENT_AMBULATORY_CARE_PROVIDER_SITE_OTHER): Payer: Medicare Other | Admitting: Internal Medicine

## 2020-02-06 ENCOUNTER — Other Ambulatory Visit: Payer: Self-pay

## 2020-02-06 VITALS — BP 118/62 | HR 88 | Temp 96.9°F | Resp 16 | Ht 71.0 in | Wt 190.2 lb

## 2020-02-06 DIAGNOSIS — R0600 Dyspnea, unspecified: Secondary | ICD-10-CM

## 2020-02-06 DIAGNOSIS — Z79899 Other long term (current) drug therapy: Secondary | ICD-10-CM | POA: Diagnosis not present

## 2020-02-06 DIAGNOSIS — I482 Chronic atrial fibrillation, unspecified: Secondary | ICD-10-CM | POA: Diagnosis not present

## 2020-02-06 DIAGNOSIS — N184 Chronic kidney disease, stage 4 (severe): Secondary | ICD-10-CM

## 2020-02-06 DIAGNOSIS — E1169 Type 2 diabetes mellitus with other specified complication: Secondary | ICD-10-CM | POA: Diagnosis not present

## 2020-02-06 DIAGNOSIS — Z7901 Long term (current) use of anticoagulants: Secondary | ICD-10-CM

## 2020-02-06 DIAGNOSIS — E559 Vitamin D deficiency, unspecified: Secondary | ICD-10-CM | POA: Diagnosis not present

## 2020-02-06 DIAGNOSIS — R0609 Other forms of dyspnea: Secondary | ICD-10-CM

## 2020-02-06 DIAGNOSIS — Z794 Long term (current) use of insulin: Secondary | ICD-10-CM

## 2020-02-06 DIAGNOSIS — R0602 Shortness of breath: Secondary | ICD-10-CM | POA: Diagnosis not present

## 2020-02-06 DIAGNOSIS — E1122 Type 2 diabetes mellitus with diabetic chronic kidney disease: Secondary | ICD-10-CM | POA: Diagnosis not present

## 2020-02-06 DIAGNOSIS — I10 Essential (primary) hypertension: Secondary | ICD-10-CM

## 2020-02-06 DIAGNOSIS — E785 Hyperlipidemia, unspecified: Secondary | ICD-10-CM | POA: Diagnosis not present

## 2020-02-06 DIAGNOSIS — D509 Iron deficiency anemia, unspecified: Secondary | ICD-10-CM

## 2020-02-06 NOTE — Progress Notes (Signed)
=========================================================== ===========================================================  -   CXR shows mild fluid excess, So . . . Suggest try taking Daily dose of the   Metolazone for 4-5 days & monitor Daily weights   =========================================================== ===========================================================

## 2020-02-07 LAB — COMPLETE METABOLIC PANEL WITH GFR
AG Ratio: 1.5 (calc) (ref 1.0–2.5)
ALT: 22 U/L (ref 9–46)
AST: 20 U/L (ref 10–35)
Albumin: 4.2 g/dL (ref 3.6–5.1)
Alkaline phosphatase (APISO): 114 U/L (ref 35–144)
BUN/Creatinine Ratio: 16 (calc) (ref 6–22)
BUN: 27 mg/dL — ABNORMAL HIGH (ref 7–25)
CO2: 30 mmol/L (ref 20–32)
Calcium: 9 mg/dL (ref 8.6–10.3)
Chloride: 101 mmol/L (ref 98–110)
Creat: 1.69 mg/dL — ABNORMAL HIGH (ref 0.70–1.11)
GFR, Est African American: 43 mL/min/{1.73_m2} — ABNORMAL LOW (ref >=60)
GFR, Est Non African American: 38 mL/min/{1.73_m2} — ABNORMAL LOW (ref >=60)
Globulin: 2.8 g/dL (calc) (ref 1.9–3.7)
Glucose, Bld: 145 mg/dL — ABNORMAL HIGH (ref 65–99)
Potassium: 4.2 mmol/L (ref 3.5–5.3)
Sodium: 141 mmol/L (ref 135–146)
Total Bilirubin: 0.5 mg/dL (ref 0.2–1.2)
Total Protein: 7 g/dL (ref 6.1–8.1)

## 2020-02-07 LAB — CBC WITH DIFFERENTIAL/PLATELET
Absolute Monocytes: 846 cells/uL (ref 200–950)
Basophils Absolute: 38 cells/uL (ref 0–200)
Basophils Relative: 0.4 %
Eosinophils Absolute: 188 cells/uL (ref 15–500)
Eosinophils Relative: 2 %
HCT: 28.7 % — ABNORMAL LOW (ref 38.5–50.0)
Hemoglobin: 8.8 g/dL — ABNORMAL LOW (ref 13.2–17.1)
Lymphs Abs: 949 cells/uL (ref 850–3900)
MCH: 25.8 pg — ABNORMAL LOW (ref 27.0–33.0)
MCHC: 30.7 g/dL — ABNORMAL LOW (ref 32.0–36.0)
MCV: 84.2 fL (ref 80.0–100.0)
MPV: 8.9 fL (ref 7.5–12.5)
Monocytes Relative: 9 %
Neutro Abs: 7379 cells/uL (ref 1500–7800)
Neutrophils Relative %: 78.5 %
Platelets: 291 10*3/uL (ref 140–400)
RBC: 3.41 10*6/uL — ABNORMAL LOW (ref 4.20–5.80)
RDW: 15.4 % — ABNORMAL HIGH (ref 11.0–15.0)
Total Lymphocyte: 10.1 %
WBC: 9.4 10*3/uL (ref 3.8–10.8)

## 2020-02-07 LAB — MAGNESIUM: Magnesium: 2.6 mg/dL — ABNORMAL HIGH (ref 1.5–2.5)

## 2020-02-07 LAB — LIPID PANEL
Cholesterol: 121 mg/dL (ref <200)
HDL: 28 mg/dL — ABNORMAL LOW (ref >=40)
LDL Cholesterol (Calc): 72 mg/dL (calc)
Non-HDL Cholesterol (Calc): 93 mg/dL (calc) (ref <130)
Total CHOL/HDL Ratio: 4.3 (calc) (ref <5.0)
Triglycerides: 128 mg/dL (ref <150)

## 2020-02-07 LAB — TSH: TSH: 1.56 mIU/L (ref 0.40–4.50)

## 2020-02-07 LAB — PROTIME-INR
INR: 3.4 — ABNORMAL HIGH
Prothrombin Time: 31.9 s — ABNORMAL HIGH (ref 9.0–11.5)

## 2020-02-07 LAB — HEMOGLOBIN A1C
Hgb A1c MFr Bld: 8.2 % of total Hgb — ABNORMAL HIGH (ref <5.7)
Mean Plasma Glucose: 189 mg/dL
eAG (mmol/L): 10.4 mmol/L

## 2020-02-07 LAB — VITAMIN D 25 HYDROXY (VIT D DEFICIENCY, FRACTURES): Vit D, 25-Hydroxy: 56 ng/mL (ref 30–100)

## 2020-02-07 NOTE — Progress Notes (Signed)
========================================================== -   Test results slightly outside the reference range are not unusual. If there is anything important, I will review this with you,  otherwise it is considered normal test values.  If you have further questions,  please do not hesitate to contact me at the office or via My Chart.  ========================================================== ==========================================================  -  CBC shows Hgb (red cell count) has dropped over the last month from                                                                                 11.1 gm% to now 8.8 gm%  - Per our discussion , since you don't feel having active bleeding, come                        to office in the morning to recheck CBC and stool test for blood   - Also PT /INR is increased to    3.4 x      -  So stop Coumadin for now  ========================================================== ==========================================================  -  Kidney functions (GFR) Still Stage 3b and stable, but the kidney disease                                                       may be the major contributor to the Anemia.   ========================================================== ==========================================================  -  Total Chol = 121 and LDL Chol = 72 - Both  Excellent   - Very low risk for Heart Attack  / Stroke ========================================================== ==========================================================  - A1c = 8.2 % is up slightly , So . . .  . [name]  - Avoid Sweets, Candy & White Stuff   - Rice, Potatoes, Breads &  Pasta  ========================================================== ==========================================================  -  Vitamin D= 56 - OK   ========================================================== ==========================================================  -  Office visit  tomorrow as discussed,   but if develop chest pains, severe short of breath or active bleeding,                                                                                          call 911 or go to ER   ========================================================== ==========================================================

## 2020-02-08 ENCOUNTER — Ambulatory Visit (INDEPENDENT_AMBULATORY_CARE_PROVIDER_SITE_OTHER): Payer: Medicare Other | Admitting: Internal Medicine

## 2020-02-08 ENCOUNTER — Other Ambulatory Visit: Payer: Self-pay

## 2020-02-08 VITALS — BP 124/62 | HR 87 | Temp 96.7°F | Resp 16 | Ht 71.0 in | Wt 188.2 lb

## 2020-02-08 DIAGNOSIS — Z79899 Other long term (current) drug therapy: Secondary | ICD-10-CM | POA: Diagnosis not present

## 2020-02-08 DIAGNOSIS — D520 Dietary folate deficiency anemia: Secondary | ICD-10-CM | POA: Diagnosis not present

## 2020-02-08 DIAGNOSIS — D519 Vitamin B12 deficiency anemia, unspecified: Secondary | ICD-10-CM | POA: Diagnosis not present

## 2020-02-08 DIAGNOSIS — Z7901 Long term (current) use of anticoagulants: Secondary | ICD-10-CM | POA: Diagnosis not present

## 2020-02-08 DIAGNOSIS — D509 Iron deficiency anemia, unspecified: Secondary | ICD-10-CM | POA: Diagnosis not present

## 2020-02-08 NOTE — Progress Notes (Signed)
History of Present Illness:       The patient is a very nice 81 yo MWM with multiple co-morbidities including  HTN, ASHD/CABG/chAfib/ Pulm. HTN, COPD,HLD, T2_DM/CKD34and Vitamin D Deficiency who returns for short 2 day f/u after discovering a drop of Hgb from 11.1gm% 1 month previous to current 8.8gm%.  Patient denies any sx's of melena or hematochezia. Patient previously seen by Dr Irene Limbo who felt primary etiology of his anemia was iron deficiency and supplements oral & periodically IV iron.  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     Medications  .  insulin NPH-regular (70-30), Inject 45 units - morning and 30 units - evening  .  atorvastatin (LIPITOR) 20 MG tablet, Take 1 tablet 3 x /week      .  carvedilol (COREG) 6.25 MG tablet, Take 0.5 tablets (3.125 mg total)  in the morning AND 1 tablet (6.25 mg total) every evening. .  metolazone  2.5 MG tablet, Take 2.5 mg  daily as needed (swelling) .  torsemide (DEMADEX) 100 MG tablet, Take 150 mg  daily.   .  ASTELIN 0.1 % nasal spray, Place 2 sprays into both nostrils 2  times daily as needed  .  diphenhydrAMINE 25 MG tablet, Take at bedtime as needed f .  FLONASE nasal spray, Place 1 spray into both nostrils daily as needed  .  loratadine  10 MG tablet, Take  daily as needed   .  acetaminophen 500 MG tablet, Take 1,000 mg  every 6 hours as needed  .  allopurinol 300 MG tablet, Take 1 tablet daily. [name]  aspirin EC 81 MG tablet, Take 1 tablet daily.   [name]  warfarin  5 MG tablet, Take    1.5 tablets /day      or as directed  .  ALPRAZolam  1 MG tablet, Take 0.5 tablets at bedtime as needed for sleep. [name]  GAVISCON, Take 4 tablets daily as needed .  B Complex Vitamins, Take 1 tablet  at bedtime.  .  bacitracin ointment, Apply daily as needed for wound care. [name]  VITAMIN D, Take 5,000 Units daily.  [name]  PRESERVISION AREDS 2 \, Take 1 capsule 2 times daily.  [name]  LIQUIFILM TEARS 1.4 % ophth soln, Place 1  drop into both eyes daily as needed for dry eyes. [name]  KLOR-CON 20 MEQ , Take 2 tablets 2 x /day for Potassium .  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; Type 2 diabetes mellitus with stage 4 chronic kidney disease, with long-term current use of insulin ([place]); GERD; DJD (degenerative joint disease); Vitamin D deficiency; Chronic anticoagulation (INR goal 2.0-2.5); Essential hypertension; CAD (coronary artery disease); PFO (patent foramen ovale); 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[place]); OSA on CPAP; S/P Maze operation for atrial fibrillation; Pulmonary hypertension ([place]); 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   Observations/Objective:   BP 124/62-SIT  & 118/62-STAND  P 87  102 /  T 96.7 F (35.9 C)   R 16   Ht 5\' 11"     Wt 188 lb 3.2 oz   SpO2 95%  sitting and 80 % after walking  BMI 26.25   HEENT - WNL. Neck - supple.  Chest - Clear equal BS. Cor - Nl HS. RRR w/o sig MGR. PP 1(+). No edema. MS- FROM w/o deformities.  Gait Nl. Rectal /DRE - Prostate 1+ / Stool light brown & Heme Neg. Neuro -  Nl w/o focal abnormalities.  Assessment and Plan:  1. Iron deficiency anemia  - CBC with Differential/Platelet - Ferritin - Iron,Total/Total Iron Binding Cap - Reticulocytes  2. Anemia due to vitamin B12 deficiency, r/o   - CBC with Differential/Platelet - Reticulocytes -  Vitamin B12  3. Dietary folate deficiency anemia, r/o  - CBC with Differential/Platelet - Folate RBC  4. Chronic anticoagulation  - CBC with Differential/Platelet  5. Medication management  - CBC with Differential/Platelet - Ferritin - Iron,Total/Total Iron Binding Cap - Reticulocytes - Vitamin B12 - Folate RBC   Follow Up Instructions:      I discussed the assessment and treatment plan with the patient. The patient was provided an opportunity to ask questions and all were answered. The patient agreed with the plan and demonstrated an understanding of the instructions.       The patient was advised to call back or seek an in-person evaluation if the symptoms worsen or if the condition fails to improve as anticipated.   Kirtland Bouchard, MD

## 2020-02-09 ENCOUNTER — Other Ambulatory Visit: Payer: Self-pay | Admitting: Internal Medicine

## 2020-02-09 DIAGNOSIS — D649 Anemia, unspecified: Secondary | ICD-10-CM

## 2020-02-09 NOTE — Progress Notes (Signed)
========================================================== ==========================================================  -    CBC - Hgb or Red Cell Count - altho low - it appears stable and since your   Stool test was negative for blood, my suspicion is that your depressed   kidney function may be the major contributor to your anemia and that you   might possibly need shot of Erythropoietin to stimulate your bone marrow   So . . . .   - Will request urgent appointment with Dr Irene Limbo, your hematologist  ========================================================== ==========================================================  -  Your iron levels are still very low, so try to take an iron tablet 2 to 3 x /day   After meals as tolerated ========================================================== ==========================================================  -  Your Vitamin B12 level is normal ========================================================== ==========================================================  -  Your reticulocyte count is elevated as anticipated as your bone marrow is responding to your anemia

## 2020-02-10 LAB — RETICULOCYTES
ABS Retic: 78430 cells/uL (ref 25000–9000)
Retic Ct Pct: 2.3 %

## 2020-02-10 LAB — CBC WITH DIFFERENTIAL/PLATELET
Absolute Monocytes: 756 cells/uL (ref 200–950)
Basophils Absolute: 42 cells/uL (ref 0–200)
Basophils Relative: 0.5 %
Eosinophils Absolute: 202 cells/uL (ref 15–500)
Eosinophils Relative: 2.4 %
HCT: 28.6 % — ABNORMAL LOW (ref 38.5–50.0)
Hemoglobin: 8.7 g/dL — ABNORMAL LOW (ref 13.2–17.1)
Lymphs Abs: 739 cells/uL — ABNORMAL LOW (ref 850–3900)
MCH: 25.5 pg — ABNORMAL LOW (ref 27.0–33.0)
MCHC: 30.4 g/dL — ABNORMAL LOW (ref 32.0–36.0)
MCV: 83.9 fL (ref 80.0–100.0)
MPV: 8.9 fL (ref 7.5–12.5)
Monocytes Relative: 9 %
Neutro Abs: 6661 cells/uL (ref 1500–7800)
Neutrophils Relative %: 79.3 %
Platelets: 256 10*3/uL (ref 140–400)
RBC: 3.41 10*6/uL — ABNORMAL LOW (ref 4.20–5.80)
RDW: 15.5 % — ABNORMAL HIGH (ref 11.0–15.0)
Total Lymphocyte: 8.8 %
WBC: 8.4 10*3/uL (ref 3.8–10.8)

## 2020-02-10 LAB — FERRITIN: Ferritin: 29 ng/mL (ref 24–380)

## 2020-02-10 LAB — IRON, TOTAL/TOTAL IRON BINDING CAP
%SAT: 7 % (calc) — ABNORMAL LOW (ref 20–48)
Iron: 28 ug/dL — ABNORMAL LOW (ref 50–180)
TIBC: 402 mcg/dL (calc) (ref 250–425)

## 2020-02-10 LAB — VITAMIN B12: Vitamin B-12: 455 pg/mL (ref 200–1100)

## 2020-02-10 LAB — FOLATE RBC: RBC Folate: 1212 ng/mL RBC (ref >280)

## 2020-02-10 NOTE — Progress Notes (Incomplete)
History of Present Illness:       The patient is a very nice 80 yo MWM iwith multiple co-morbidities including  HTN, ASHD/CABG/chAfib/ Pulm. HTN, COPD,HLD, T2_DM/CKD4and Vitamin D Deficiency who returns for short 2 day f/u after discovering a drop of Hgb from 11.1gm% 1 month previous to current 8.8gm%.  Patient denies any sx's of melena or hematochezia. Patient previously seen by Dr Walker Kehr who felt primary etiology of his anemia was iron deficiency and supplements oral & IV iron.      Medications  .  insulin NPH-regular Human (70-30) 100 UNIT/ML injection, Inject 25-45 Units into the skin See admin instructions. 45 units in the morning and 30 units in the evening  .  atorvastatin (LIPITOR) 20 MG tablet, Take      1 tablet       3 x /week       for Cholesterol .  carvedilol (COREG) 6.25 MG tablet, Take 0.5 tablets (3.125 mg total) by mouth in the morning AND 1 tablet (6.25 mg total) every evening. .  metolazone  2.5 MG tablet, TAKE 1 TABLET BY MOUTH  DAILY AS NEEDED FOR  SWELLING (Patient taking differently: Take 2.5 mg by mouth daily as needed (swelling).) .  torsemide (DEMADEX) 100 MG tablet, Take 150 mg by mouth daily.   .  ASTELIN 0.1 % nasal spray, Place 2 sprays into both nostrils 2 (two) times daily as needed for rhinitis or allergies. .  diphenhydrAMINE 25 MG tablet, Take 25 mg  at bedtime as needed f .  FLONASE nasal spray, Place 1 spray into both nostrils daily as needed  .  loratadine  10 MG tablet, Take 10 mg by mouth daily as needed   .  acetaminophen 500 MG tablet, Take 1,000 mg  every 6 hours as needed  .  allopurinol 300 MG tablet, Take 300 mg by mouth daily. [name]  aspirin EC 81 MG tablet, Take daily.   [name]  warfarin  5 MG tablet, Take    1.5 tablets /day      or as directed  .  ALPRAZolam  1 MG tablet, Take 0.5 tablets at bedtime as needed for sleep. [name]  GAVISCON, Take 4 tablets daily as needed .  B Complex Vitamins, Take 1 tablet  at bedtime.  .  bacitracin ointment,  Apply daily as needed for wound care. [name]  VITAMIN D, Take 5,000 Units daily.  [name]  PRESERVISION AREDS 2 \, Take 1 capsule 2 times daily.  [name]  LIQUIFILM TEARS 1.4 % ophth soln, Place 1 drop into both eyes daily as needed for dry eyes. [name]  KLOR-CON 20 MEQ , Take 2 tablets 2 x /day for Potassium .  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; Type 2 diabetes mellitus with stage 4 chronic kidney disease, with long-term current use of insulin ([place]); GERD; DJD (degenerative joint disease); Vitamin D deficiency; Chronic anticoagulation (INR goal 2.0-2.5); Essential hypertension; CAD (coronary artery disease); PFO (patent foramen ovale); 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[place]); OSA on CPAP; S/P Maze operation for atrial fibrillation; Pulmonary hypertension ([place]); 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   Observations/Objective:   BP 124/62-SIT  & 118/62-STAND  P 87  102 / T 96.7 F (35.9 C)   R 16   Ht 5\' 11"     Wt 188 lb 3.2 oz   SpO2 95%  sitting and 80 % after walking  BMI 26.25   HEENT - WNL. Neck - supple.  Chest - Clear equal BS. Cor - Nl HS. RRR w/o sig MGR. PP 1(+). No edema. MS- FROM w/o deformities.  Gait Nl. Rectal /DRE - Prostate 1+ / Stool light brown & Heme Neg. Neuro -  Nl w/o focal abnormalities.  Assessment and Plan:  1. Iron deficiency anemia  - CBC with  Differential/Platelet - Ferritin - Iron,Total/Total Iron Binding Cap - Reticulocytes  2. Anemia due to vitamin B12 deficiency, r/o   - CBC with Differential/Platelet - Reticulocytes - Vitamin B12  3. Dietary folate deficiency anemia, r/o  - CBC with Differential/Platelet - Folate RBC  4. Chronic anticoagulation  - CBC with Differential/Platelet  5. Medication management  - CBC with Differential/Platelet - Ferritin - Iron,Total/Total Iron Binding Cap - Reticulocytes - Vitamin B12 - Folate RBC       Follow Up Instructions:    I discussed the assessment and treatment plan with the patient. The patient was provided an opportunity to ask questions and all were answered. The patient agreed with the plan and demonstrated an understanding of the instructions.   The patient was advised to call back or seek an in-person evaluation if the symptoms worsen or if the condition fails to improve as anticipated.    Kirtland Bouchard, MD

## 2020-02-11 ENCOUNTER — Encounter: Payer: Self-pay | Admitting: Internal Medicine

## 2020-02-13 ENCOUNTER — Other Ambulatory Visit: Payer: Self-pay | Admitting: Hematology

## 2020-02-13 ENCOUNTER — Telehealth: Payer: Self-pay | Admitting: *Deleted

## 2020-02-13 NOTE — Telephone Encounter (Signed)
-----   Message from Brunetta Genera, MD sent at 02/13/2020 11:15 AM EST ----- Thanks, Lovey Newcomer-- could u let the patient know he is developing worsening anemia and iron def. Hgb is down to 8.7 from 12.5 in the last 4 months. Likely from slow gi losses from anticoagulation. WOuld recommend additional IV Iron to address his anemia. If patient is okay would set him up for 2 additional doses of IV Injectafer weekly x 2. He has a f/u appointment with me in 03/2020. Thx GK ----- Message ----- From: [name], MD Sent: 02/09/2020   4:58 PM EST To: Brunetta Genera, MD  For your review

## 2020-02-13 NOTE — Telephone Encounter (Signed)
Contacted patient as requested. Patient verbalized understanding of information. He is in agreement to have 2 doses injectafer and would like to have it soon.  He states he is weak and holds on to furniture when he moves about his home. Advised to change position slowly, consider an assistive support device such as cane or walkerand to request a wheelchair when he visits the [place]. He verbalized understanding.  Schedule message sent to schedule appointments.

## 2020-02-14 ENCOUNTER — Telehealth: Payer: Self-pay | Admitting: Hematology

## 2020-02-14 NOTE — Telephone Encounter (Signed)
Scheduled appt per 2/08 sch msg - unable to reach pt. Left message for patient with appt date and time

## 2020-02-19 ENCOUNTER — Other Ambulatory Visit: Payer: Self-pay

## 2020-02-19 ENCOUNTER — Inpatient Hospital Stay: Payer: Medicare Other | Attending: Hematology

## 2020-02-19 VITALS — BP 122/74 | HR 66 | Temp 98.6°F | Resp 18

## 2020-02-19 DIAGNOSIS — D509 Iron deficiency anemia, unspecified: Secondary | ICD-10-CM | POA: Insufficient documentation

## 2020-02-19 MED ORDER — LORATADINE 10 MG PO TABS
ORAL_TABLET | ORAL | Status: AC
  Filled 2020-02-19: qty 1

## 2020-02-19 MED ORDER — ACETAMINOPHEN 325 MG PO TABS
650.0000 mg | ORAL_TABLET | Freq: Once | ORAL | Status: AC
  Administered 2020-02-19: 650 mg via ORAL

## 2020-02-19 MED ORDER — FAMOTIDINE 20 MG PO TABS
ORAL_TABLET | ORAL | Status: AC
  Filled 2020-02-19: qty 1

## 2020-02-19 MED ORDER — SODIUM CHLORIDE 0.9 % IV SOLN
750.0000 mg | Freq: Once | INTRAVENOUS | Status: AC
  Administered 2020-02-19: 750 mg via INTRAVENOUS
  Filled 2020-02-19: qty 15

## 2020-02-19 MED ORDER — LORATADINE 10 MG PO TABS
10.0000 mg | ORAL_TABLET | Freq: Once | ORAL | Status: AC
  Administered 2020-02-19: 10 mg via ORAL

## 2020-02-19 MED ORDER — FAMOTIDINE 20 MG PO TABS
20.0000 mg | ORAL_TABLET | Freq: Once | ORAL | Status: AC
  Administered 2020-02-19: 20 mg via ORAL

## 2020-02-19 MED ORDER — SODIUM CHLORIDE 0.9 % IV SOLN
Freq: Once | INTRAVENOUS | Status: AC
  Filled 2020-02-19: qty 250

## 2020-02-19 MED ORDER — ACETAMINOPHEN 325 MG PO TABS
ORAL_TABLET | ORAL | Status: AC
  Filled 2020-02-19: qty 2

## 2020-02-19 NOTE — Patient Instructions (Signed)
Ferric carboxymaltose injection What is this medicine? FERRIC CARBOXYMALTOSE (ferr-ik car-box-ee-mol-toes) is an iron complex. Iron is used to make healthy red blood cells, which carry oxygen and nutrients throughout the body. This medicine is used to treat anemia in people with chronic kidney disease or people who cannot take iron by mouth. This medicine may be used for other purposes; ask your health care provider or pharmacist if you have questions. COMMON BRAND NAME(S): Injectafer What should I tell my health care provider before I take this medicine? They need to know if you have any of these conditions:  high levels of iron in the blood  liver disease  an unusual or allergic reaction to iron, other medicines, foods, dyes, or preservatives  pregnant or trying to get pregnant  breast-feeding How should I use this medicine? This medicine is for infusion into a vein. It is given by a health care professional in a hospital or clinic setting. Talk to your pediatrician regarding the use of this medicine in children. Special care may be needed. Overdosage: If you think you have taken too much of this medicine contact a poison control center or emergency room at once. NOTE: This medicine is only for you. Do not share this medicine with others. What if I miss a dose? Keep appointments for follow-up doses. It is important not to miss your dose. Call your care team if you are unable to keep an appointment. What may interact with this medicine? Do not take this medicine with any of the following medications:  deferoxamine  dimercaprol  other iron products This list may not describe all possible interactions. Give your health care provider a list of all the medicines, herbs, non-prescription drugs, or dietary supplements you use. Also tell them if you smoke, drink alcohol, or use illegal drugs. Some items may interact with your medicine. What should I watch for while using this  medicine? Visit your doctor or health care professional regularly. Tell your doctor if your symptoms do not start to get better or if they get worse. You may need blood work done while you are taking this medicine. You may need to follow a special diet. Talk to your doctor. Foods that contain iron include: whole grains/cereals, dried fruits, beans, or peas, leafy green vegetables, and organ meats (liver, kidney). What side effects may I notice from receiving this medicine? Side effects that you should report to your doctor or health care professional as soon as possible:  allergic reactions like skin rash, itching or hives, swelling of the face, lips, or tongue  dizziness  facial flushing Side effects that usually do not require medical attention (report to your doctor or health care professional if they continue or are bothersome):  changes in taste  constipation  headache  nausea, vomiting  pain, redness, or irritation at site where injected This list may not describe all possible side effects. Call your doctor for medical advice about side effects. You may report side effects to FDA at 1-800-FDA-1088. Where should I keep my medicine? This drug is given in a hospital or clinic and will not be stored at home. NOTE: This sheet is a summary. It may not cover all possible information. If you have questions about this medicine, talk to your doctor, pharmacist, or health care provider.  2021 [employer]vier/Gold Standard (2019-12-05 14:00:47)  

## 2020-02-26 ENCOUNTER — Ambulatory Visit: Payer: Medicare Other | Admitting: Adult Health Nurse Practitioner

## 2020-02-26 ENCOUNTER — Inpatient Hospital Stay: Payer: Medicare Other

## 2020-02-26 ENCOUNTER — Other Ambulatory Visit: Payer: Self-pay

## 2020-02-26 VITALS — BP 131/61 | HR 76 | Temp 97.6°F | Resp 16

## 2020-02-26 DIAGNOSIS — D509 Iron deficiency anemia, unspecified: Secondary | ICD-10-CM | POA: Diagnosis not present

## 2020-02-26 MED ORDER — FAMOTIDINE 20 MG PO TABS
ORAL_TABLET | ORAL | Status: AC
  Filled 2020-02-26: qty 1

## 2020-02-26 MED ORDER — FAMOTIDINE 20 MG PO TABS
20.0000 mg | ORAL_TABLET | Freq: Once | ORAL | Status: AC
  Administered 2020-02-26: 20 mg via ORAL

## 2020-02-26 MED ORDER — ACETAMINOPHEN 325 MG PO TABS: 650.0000 mg | ORAL_TABLET | Freq: Once | ORAL | Status: DC

## 2020-02-26 MED ORDER — ACETAMINOPHEN 325 MG PO TABS
ORAL_TABLET | ORAL | Status: AC
  Filled 2020-02-26: qty 2

## 2020-02-26 MED ORDER — SODIUM CHLORIDE 0.9 % IV SOLN
750.0000 mg | Freq: Once | INTRAVENOUS | Status: AC
  Administered 2020-02-26: 750 mg via INTRAVENOUS
  Filled 2020-02-26: qty 15

## 2020-02-26 MED ORDER — LORATADINE 10 MG PO TABS
ORAL_TABLET | ORAL | Status: AC
  Filled 2020-02-26: qty 1

## 2020-02-26 MED ORDER — LORATADINE 10 MG PO TABS
10.0000 mg | ORAL_TABLET | Freq: Every day | ORAL | Status: DC
  Administered 2020-02-26: 10 mg via ORAL

## 2020-02-26 NOTE — Patient Instructions (Signed)
Ferric carboxymaltose injection What is this medicine? FERRIC CARBOXYMALTOSE (ferr-ik car-box-ee-mol-toes) is an iron complex. Iron is used to make healthy red blood cells, which carry oxygen and nutrients throughout the body. This medicine is used to treat anemia in people with chronic kidney disease or people who cannot take iron by mouth. This medicine may be used for other purposes; ask your health care provider or pharmacist if you have questions. COMMON BRAND NAME(S): Injectafer What should I tell my health care provider before I take this medicine? They need to know if you have any of these conditions:  high levels of iron in the blood  liver disease  an unusual or allergic reaction to iron, other medicines, foods, dyes, or preservatives  pregnant or trying to get pregnant  breast-feeding How should I use this medicine? This medicine is for infusion into a vein. It is given by a health care professional in a hospital or clinic setting. Talk to your pediatrician regarding the use of this medicine in children. Special care may be needed. Overdosage: If you think you have taken too much of this medicine contact a poison control center or emergency room at once. NOTE: This medicine is only for you. Do not share this medicine with others. What if I miss a dose? Keep appointments for follow-up doses. It is important not to miss your dose. Call your care team if you are unable to keep an appointment. What may interact with this medicine? Do not take this medicine with any of the following medications:  deferoxamine  dimercaprol  other iron products This list may not describe all possible interactions. Give your health care provider a list of all the medicines, herbs, non-prescription drugs, or dietary supplements you use. Also tell them if you smoke, drink alcohol, or use illegal drugs. Some items may interact with your medicine. What should I watch for while using this  medicine? Visit your doctor or health care professional regularly. Tell your doctor if your symptoms do not start to get better or if they get worse. You may need blood work done while you are taking this medicine. You may need to follow a special diet. Talk to your doctor. Foods that contain iron include: whole grains/cereals, dried fruits, beans, or peas, leafy green vegetables, and organ meats (liver, kidney). What side effects may I notice from receiving this medicine? Side effects that you should report to your doctor or health care professional as soon as possible:  allergic reactions like skin rash, itching or hives, swelling of the face, lips, or tongue  dizziness  facial flushing Side effects that usually do not require medical attention (report to your doctor or health care professional if they continue or are bothersome):  changes in taste  constipation  headache  nausea, vomiting  pain, redness, or irritation at site where injected This list may not describe all possible side effects. Call your doctor for medical advice about side effects. You may report side effects to FDA at 1-800-FDA-1088. Where should I keep my medicine? This drug is given in a hospital or clinic and will not be stored at home. NOTE: This sheet is a summary. It may not cover all possible information. If you have questions about this medicine, talk to your doctor, pharmacist, or health care provider.  2021 [employer]vier/Gold Standard (2019-12-05 14:00:47)  

## 2020-02-27 ENCOUNTER — Ambulatory Visit (INDEPENDENT_AMBULATORY_CARE_PROVIDER_SITE_OTHER): Payer: Medicare Other | Admitting: Adult Health Nurse Practitioner

## 2020-02-27 ENCOUNTER — Encounter: Payer: Self-pay | Admitting: Adult Health Nurse Practitioner

## 2020-02-27 VITALS — BP 120/74 | HR 68 | Temp 97.7°F | Wt 187.0 lb

## 2020-02-27 DIAGNOSIS — R6889 Other general symptoms and signs: Secondary | ICD-10-CM

## 2020-02-27 DIAGNOSIS — J984 Other disorders of lung: Secondary | ICD-10-CM | POA: Diagnosis not present

## 2020-02-27 DIAGNOSIS — I43 Cardiomyopathy in diseases classified elsewhere: Secondary | ICD-10-CM | POA: Diagnosis not present

## 2020-02-27 DIAGNOSIS — I2510 Atherosclerotic heart disease of native coronary artery without angina pectoris: Secondary | ICD-10-CM

## 2020-02-27 DIAGNOSIS — E785 Hyperlipidemia, unspecified: Secondary | ICD-10-CM

## 2020-02-27 DIAGNOSIS — Z0001 Encounter for general adult medical examination with abnormal findings: Secondary | ICD-10-CM | POA: Diagnosis not present

## 2020-02-27 DIAGNOSIS — Z79899 Other long term (current) drug therapy: Secondary | ICD-10-CM

## 2020-02-27 DIAGNOSIS — I10 Essential (primary) hypertension: Secondary | ICD-10-CM

## 2020-02-27 DIAGNOSIS — E1122 Type 2 diabetes mellitus with diabetic chronic kidney disease: Secondary | ICD-10-CM

## 2020-02-27 DIAGNOSIS — Q211 Atrial septal defect: Secondary | ICD-10-CM

## 2020-02-27 DIAGNOSIS — I484 Atypical atrial flutter: Secondary | ICD-10-CM

## 2020-02-27 DIAGNOSIS — I5032 Chronic diastolic (congestive) heart failure: Secondary | ICD-10-CM

## 2020-02-27 DIAGNOSIS — K219 Gastro-esophageal reflux disease without esophagitis: Secondary | ICD-10-CM

## 2020-02-27 DIAGNOSIS — I110 Hypertensive heart disease with heart failure: Secondary | ICD-10-CM

## 2020-02-27 DIAGNOSIS — Z794 Long term (current) use of insulin: Secondary | ICD-10-CM

## 2020-02-27 DIAGNOSIS — K5730 Diverticulosis of large intestine without perforation or abscess without bleeding: Secondary | ICD-10-CM

## 2020-02-27 DIAGNOSIS — N183 Chronic kidney disease, stage 3 unspecified: Secondary | ICD-10-CM

## 2020-02-27 DIAGNOSIS — E1169 Type 2 diabetes mellitus with other specified complication: Secondary | ICD-10-CM | POA: Diagnosis not present

## 2020-02-27 DIAGNOSIS — Q2112 Patent foramen ovale: Secondary | ICD-10-CM

## 2020-02-27 DIAGNOSIS — Z0000 Encounter for general adult medical examination without abnormal findings: Secondary | ICD-10-CM

## 2020-02-27 NOTE — Progress Notes (Signed)
MEDICARE ANNUAL WELLNESS VISIT AND FOLLOW UP   Assessment:   Medicare annual wellness visit Yearly  PAF (paroxysmal atrial fibrillation) ([place]) Continue follow up cardio, continue coumadin -     CBC with Differential/Platelet  Chronic diastolic congestive heart failure (HCC) Weight stable, continue medications, refill potassium -     potassium chloride SA (K-DUR,KLOR-CON) 20 MEQ tablet; Take 1 tablet (20 mEq total) by mouth 2 (two) times daily.  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 Call if greater than 130/80.  -     CMP/GFR  Coronary artery disease involving native coronary artery of native heart without angina pectoris Control blood pressure, cholesterol, glucose, increase exercise.   PFO (patent foramen ovale) Monitoring with cardio  Pulmonary hypertension (HCC) Monitored by cardio and pulmonary, doing much better  Atrial flutter, unspecified type ([place]) Continue follow up cardio, continue coumadin   Diverticulosis of large intestine without hemorrhage  No tenderness, continue fiber  GERD Continue PPI/H2 blocker, diet discussed  Controlled type 2 diabetes mellitus with stage 3 chronic kidney disease, without long-term current use of insulin (HCC) NPH 70/30 45units in am and 30units in evening. Discussed general issues about diabetes pathophysiology and management., Educational material distributed., Suggested low cholesterol diet., Encouraged aerobic exercise., Discussed foot care., Reminded to get yearly retinal exam - report from most recent verified   CKD (chronic kidney  disease) stage 3, GFR 30-59 ml/min  avoid NSAIDS, monitor sugars, will monitor -     BASIC METABOLIC PANEL WITH GFR   Chronic respiratory failure with hypoxia (HCC) Monitored by pulmonary  OSA on CPAP Continue CPAP  Chronic restrictive lung disease Monitored by pulmonary  Hyperlipidemia w/ diabetes -continue medications, check lipids, decrease fatty foods, increase activity.    Medication management Continued   Over 30 minutes of face to face exam, counseling, chart review, and critical decision making was performed.  Future Appointments  Date Time Provider [place]  03/13/2020 11:30 AM [facility] None  03/13/2020 12:00 PM Brunetta Genera, MD [facility] None  03/26/2020 11:00 AM Liane Comber, NP [facility] None  04/30/2020  4:00 PM [name], MD [facility] None  06/07/2020 12:30 PM Hayden Pedro, MD [facility] None  10/23/2020 11:00 AM [name], MD [facility] None  02/26/2021  3:30 PM Garnet Sierras, NP [facility] None     Plan:   During the course of the visit the patient was educated and counseled about appropriate screening and preventive services including:    Pneumococcal vaccine   Influenza vaccine  Prevnar 13  Td vaccine  Screening electrocardiogram  Colorectal cancer screening  Diabetes screening  Glaucoma screening  Nutrition counseling    Subjective:  [name]. is a 80 y.o. male who presents for Medicare Annual Wellness Visit and 1 month follow up for HTN, CKD, weight and pfib coumadin management.   He has follow up appointment next month.  He has a complicated heart history, had ASCAD and pfib/flutter in Apr 2015 with subsequent CABG with MAZE. 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. Weight is stable.  He has a history of Diastolic CHF (last ECHO 82/4235 LVEF  60-65%) Denies orthopnea, paroxysmal nocturnal dyspnea and edema. Positive for exertional dyspnea (taking out trash cans, etc). Consistent with baseline. He checks weight daily and transmits to insurance. He takes 150 mg torsemide in AM. Has PRN metolazone 5 mg but hasn't needed in several months.  Wt Readings from Last 3 Encounters:  02/27/20 187 lb (84.8 kg)  02/08/20 188 lb 3.2 oz (85.4 kg)  02/06/20 190 lb 3.2 oz (86.3 kg)   He is on xanax as needed for sleep and zoloft for depression/anxiety which is in remission.   BMI is Body mass index is 26.08 kg/m., he has been working on diet; admits exercise is limited with current cold weather, avoiding gym with current pandemic.  Wt Readings from Last 3 Encounters:  02/27/20 187 lb (84.8 kg)  02/08/20 188 lb 3.2 oz (85.4 kg)  02/06/20 190 lb 3.2 oz (86.3 kg)   His blood pressure has been controlled at home, today their BP is BP: 120/74 He does workout, walks and works out in the yard. He denies chest pain, shortness of breath, dizziness.   He is still on coumadin at this time, denies any bleeding, black stools, etc.  He is on 10 mg daily previously prior to holding for elevated INR, was instructed to resume 5 mg daily on 12/26/2018:  Lab Results  Component Value Date   INR 1.1 02/27/2020   INR 3.4 (H) 02/06/2020   INR 1.3 (H) 01/01/2020   Last GFR Lab Results  Component Value Date   GFRNONAA 36 (L) 02/27/2020  Follows with nephrology   Lab Results  Component Value Date   WBC 8.0 02/27/2020   HGB 10.3 (L) 02/27/2020   HCT 33.9 (L) 02/27/2020   MCV 88.1 02/27/2020   PLT 281 02/27/2020     Medication Review: Current Outpatient Medications on File Prior to Visit  Medication Sig  . ACCU-CHEK AVIVA PLUS test strip CHECK BLOOD GLUCOSE 3 TIMES DAILY  . acetaminophen (TYLENOL) 500 MG tablet Take 1,000 mg by mouth every 6 (six) hours as needed for moderate pain or headache.   . allopurinol (ZYLOPRIM) 300 MG tablet Take 300 mg by  mouth daily.  [name] ALPRAZolam (XANAX) 1 MG tablet Take 0.5 tablets (0.5 mg total) by mouth at bedtime as needed for sleep.  [name] Alum Hydroxide-Mag Carbonate (GAVISCON PO) Take 4 tablets by mouth daily as needed (acid reflux).   [name] aspirin EC 81 MG tablet Take 81 mg by mouth daily.  [name] atorvastatin (LIPITOR) 20 MG tablet Take      1 tablet       3 x /week       for Cholesterol  . azelastine (ASTELIN) 0.1 % nasal spray Place 2 sprays into both nostrils 2 (two) times daily as needed for rhinitis or allergies.  . B Complex Vitamins (VITAMIN B COMPLEX PO) Take 1 tablet by mouth at bedtime.   . bacitracin 500 UNIT/GM ointment Apply 1 application topically daily as needed for wound care.  . Blood Glucose Monitoring Suppl (ACCU-CHEK AVIVA PLUS) w/Device KIT Check blood sugar 1 time  daily  . Blood Glucose Monitoring Suppl DEVI Test blood sugar up to three times a day or as directed.  . carvedilol (COREG) 6.25 MG tablet Take 0.5 tablets (3.125 mg total) by mouth in the morning AND 1 tablet (6.25 mg total) every evening.  . Cholecalciferol (VITAMIN D PO) Take 5,000 Units by mouth daily.   [name]  diphenhydrAMINE (BENADRYL) 25 MG tablet Take 25 mg by mouth at bedtime as needed for allergies.  . fluticasone (FLONASE) 50 MCG/ACT nasal spray Place 1 spray into both nostrils daily as needed for allergies.  [name] insulin NPH-regular Human (70-30) 100 UNIT/ML injection Inject 25-45 Units into the skin See admin instructions. 45 units in the morning and 30 units in the evening  . loratadine (CLARITIN) 10 MG tablet Take 10 mg by mouth daily as needed for allergies.   . metolazone (ZAROXOLYN) 2.5 MG tablet TAKE 1 TABLET BY MOUTH  DAILY AS NEEDED FOR  SWELLING (Patient taking differently: Take 2.5 mg by mouth daily as needed (swelling).)  . Multiple Vitamins-Minerals (PRESERVISION AREDS 2) CAPS Take 1 capsule by mouth 2 (two) times daily.   . polyvinyl alcohol (LIQUIFILM TEARS) 1.4 % ophthalmic solution Place 1 drop into both eyes  daily as needed for dry eyes.  . potassium chloride SA (KLOR-CON) 20 MEQ tablet Take      2 tablets       3 x /day       for Potassium  . sertraline (ZOLOFT) 100 MG tablet Take 1 tablet Daily for Mood (Patient taking differently: Take 100 mg by mouth daily. Take 1 tablet Daily for Mood)  . torsemide (DEMADEX) 100 MG tablet Take 150 mg by mouth daily.  [name] warfarin (COUMADIN) 5 MG tablet Take    1.5 tablets /day      or as directed   No current facility-administered medications on file prior to visit.    Allergies: Allergies  Allergen Reactions  . Sunflower Seed [Sunflower Oil] Swelling and Other (See Comments)    Tongue and lip swelling  . Horse-Derived Products Other (See Comments)    Per allergy skin test UNSPECIFIED REACTION   . Tetanus Toxoids Other (See Comments)    Per allergy skin test  . 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; Type 2 diabetes mellitus with stage 4 chronic kidney disease, with long-term current use of insulin ([place]); GERD; DJD (degenerative joint disease); Vitamin D deficiency; Chronic anticoagulation (INR goal 2.0-2.5); Essential hypertension; CAD (coronary artery disease); PFO (patent foramen ovale); 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[place]); OSA on CPAP; S/P Maze operation for atrial fibrillation; Pulmonary hypertension ([place]); 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  Screening Tests Immunization History  Administered Date(s) Administered  . DT (Pediatric) 08/05/2015  . Influenza Split 10/25/2012  . Influenza, High Dose Seasonal PF 09/26/2013, 09/27/2014, 10/03/2015, 10/05/2016, 11/11/2017, 10/10/2018, 10/11/2019  . PFIZER(Purple Top)SARS-COV-2 Vaccination 01/26/2019, 02/15/2019  . Pneumococcal Conjugate-13 01/30/2014  . Pneumococcal Polysaccharide-23 10/20/2011  . Td 01/06/2000   Preventative care: Last colonoscopy: 01/26/2017,  3 year recall?  EGD: 01/26/2017  Prior vaccinations: TD or Tdap: 2002 Influenza: 10/2018 Pneumococcal: 2013 Prevnar13: 2016 Shingles/Zostavax: undecided  Names of Other Physician/Practitioners you currently use: 1. [facility] here for primary care 2. Dr. Zigmund Daniel, eye doctor, last visit 09/30/2018 - report received and  abstracted - retinopathy  3. Dr. Johnn Hai, dentist, last visit 2020, goes q31m Patient Care Team: MUnk Pinto MD as PCP - General (Internal Medicine) KInda Castle MD (Inactive) as Consulting Physician (Gastroenterology) JMartinique Peter M, MD as Consulting Physician (Cardiology) ORexene Alberts MD as Consulting Physician (Cardiothoracic Surgery) Szott, MPrescott Gum DDS as Referring Physician MHayden Pedro MD as Consulting Physician (Ophthalmology) TLavonna Monarch MD as Consulting Physician (Dermatology) NJosue Hector MD as Consulting Physician (Cardiology) AThompson Grayer MD as Consulting Physician (Cardiology) BRosita Fire MD as Consulting Physician (Nephrology) Bensimhon, DShaune Pascal MD as Consulting Physician (Cardiology)  Surgical: He  has a past surgical history that includes Polinydal cyst; Great toe arthrodesis, interphalangeal joint; Retina repair-right; Excision basal cell carcinoma; Polypectomy; Cardiac catheterization; Coronary artery bypass graft (N/A, 04/05/2013);  MAZE (N/A, 04/05/2013); Intraoprative transesophageal echocardiogram (N/A, 04/05/2013); TEE without cardioversion (N/A, 08/23/2013); Cardioversion (N/A, 08/23/2013); TEE without cardioversion (N/A, 10/26/2013); left heart catheterization with coronary angiogram (N/A, 03/07/2013); atrial fibrillation ablation (N/A, 10/26/2013); Carpometacarpel ([facility] suspension plasty (Left, 02/14/2014); Trapezium resection; right heart catheterization (N/A, 05/03/2014); TEE without cardioversion (N/A, 06/05/2014); Esophagogastroduodenoscopy (egd) with propofol (N/A, 01/26/2017); Colonoscopy with propofol (N/A, 01/26/2017); Appendectomy (03/05/2017); laparoscopic appendectomy (N/A, 03/05/2017); Cataract extraction (Bilateral); Eye surgery (Bilateral); Eye surgery (Right); Retinal detachment surgery (Right); and RIGHT HEART CATH (N/A, 07/06/2019). Family His family history includes Atrial fibrillation in his mother; Colon cancer in his mother; Colon polyps in his mother and sister; Dementia in his father; Diabetes in his maternal uncle; Hypertension in his mother; Stroke in his paternal uncle. Social history  He reports that he quit smoking about 39 years ago. His smoking use included cigarettes. He has a 100.00 pack-year smoking history. He has never used smokeless tobacco. He reports current alcohol use of about 1.0 standard drink of alcohol per week. He reports that he does not use drugs.  MEDICARE WELLNESS OBJECTIVES: Physical activity: Current Exercise Habits: The patient does not participate in regular exercise at present, Exercise limited by: None identified Cardiac risk factors: Cardiac Risk Factors include: advanced age (>569m, >6>53omen);male gender;hypertension;dyslipidemia;sedentary lifestyle;diabetes mellitus Depression/mood screen:   [facility] 02/27/2020  Decreased Interest 0  Down, Depressed, Hopeless 0  PHQ - 2 Score 0  Altered sleeping -  Tired, decreased energy -  Change in appetite -  Feeling bad or  failure about yourself  -  Trouble concentrating -  Moving slowly or fidgety/restless -  Suicidal thoughts -  PHQ-9 Score -  Difficult doing work/chores -  Some recent data might be hidden    ADLs:  In your present state of health, do you have any difficulty performing the following activities: 02/27/2020 12/18/2019  Hearing? N N  Vision? N N  Difficulty concentrating or making decisions? N N  Walking or climbing stairs? N N  Dressing or bathing? N N  Doing errands, shopping? N N  Preparing Food and eating ? N -  Using the Toilet? N -  In the past six months, have you accidently leaked urine? N -  Do you have problems with loss of bowel control? N -  Managing your Medications? N -  Managing your Finances? N -  Housekeeping or managing your Housekeeping? N -  Some recent data might be hidden     Cognitive Testing  Alert? Yes  Normal Appearance?Yes  Oriented to person? Yes  Place? Yes   Time? Yes  Recall of three objects?  Yes  Can perform simple calculations? Yes  Displays appropriate  judgment?Yes  Can read the correct time from a watch face?Yes  EOL planning: Does Patient Have a Medical Advance Directive?: Yes Type of Advance Directive: [place] will [place] in Chart?: No - copy requested  Review of Systems  Constitutional: Negative for chills, fever, malaise/fatigue and weight loss.  HENT: Negative.  Negative for hearing loss and tinnitus.   Eyes: Negative for blurred vision and double vision.  Respiratory: Negative for cough, sputum production, shortness of breath (exertional with cold weather or weight bearing exertion, consistent with baseline ) and wheezing.   Cardiovascular: Negative for chest pain, palpitations, orthopnea, claudication, leg swelling and PND.  Gastrointestinal: Negative.  Negative for abdominal pain, blood in stool, constipation, diarrhea, heartburn, melena, nausea and vomiting.  Genitourinary:  Negative.  Negative for dysuria, flank pain, frequency, hematuria and urgency.  Musculoskeletal: Negative.  Negative for falls, joint pain and myalgias.  Skin: Negative.  Negative for rash.  Neurological: Negative for dizziness, tingling, tremors, sensory change, speech change, focal weakness, seizures, loss of consciousness, weakness and headaches.  Endo/Heme/Allergies: Negative for polydipsia.  Psychiatric/Behavioral: Negative.  Negative for depression, memory loss, substance abuse and suicidal ideas. The patient is not nervous/anxious and does not have insomnia.   All other systems reviewed and are negative.    Objective:   Today's Vitals   02/27/20 1548  BP: 120/74  Pulse: 68  Temp: 97.7 F (36.5 C)  SpO2: 100%  Weight: 187 lb (84.8 kg)  PainSc: 0-No pain   Body mass index is 26.08 kg/m.  General appearance: alert, no distress, WD/WN, male HEENT: normocephalic, sclerae anicteric, TMs pearly, nares patent, no discharge or erythema, pharynx normal Oral cavity: mask in place; deferred  Neck: supple, no lymphadenopathy, no thyromegaly, no masses Heart: RRR, normal S1, S2, no murmurs Lungs: CTA bilaterally, no wheezes, rhonchi, or rales Abdomen: +bs, soft, non tender, non distended, no masses, no hepatomegaly, no splenomegaly Musculoskeletal: nontender, no swelling, no obvious deformity Extremities: no edema, no cyanosis, no clubbing Pulses: 2+ symmetric, upper and lower extremities, normal cap refill Skin: warm/dry, intact; no rashes, concerning lesions; he has scattered small ecchymoses to bil upper extremities Neurological: alert, oriented x 3 strength normal upper extremities and lower extremities, sensation normal throughout, DTRs 2+ throughout, no cerebellar signs, gait normal Psychiatric: normal affect, behavior normal, pleasant   Medicare Attestation I have personally reviewed: The patient's medical and social history Their use of alcohol, tobacco or illicit  drugs Their current medications and supplements The patient's functional ability including ADLs,fall risks, home safety risks, cognitive, and hearing and visual impairment Diet and physical activities Evidence for depression or mood disorders  The patient's weight, height, BMI, and visual acuity have been recorded in the chart.  I have made referrals, counseling, and provided education to the patient based on review of the above and I have provided the patient with a written personalized care plan for preventive services.     Garnet Sierras, NP   02/27/2020

## 2020-02-28 LAB — CBC WITH DIFFERENTIAL/PLATELET
Absolute Monocytes: 832 cells/uL (ref 200–950)
Basophils Absolute: 48 cells/uL (ref 0–200)
Basophils Relative: 0.6 %
Eosinophils Absolute: 152 cells/uL (ref 15–500)
Eosinophils Relative: 1.9 %
HCT: 33.9 % — ABNORMAL LOW (ref 38.5–50.0)
Hemoglobin: 10.3 g/dL — ABNORMAL LOW (ref 13.2–17.1)
Lymphs Abs: 752 cells/uL — ABNORMAL LOW (ref 850–3900)
MCH: 26.8 pg — ABNORMAL LOW (ref 27.0–33.0)
MCHC: 30.4 g/dL — ABNORMAL LOW (ref 32.0–36.0)
MCV: 88.1 fL (ref 80.0–100.0)
MPV: 9.5 fL (ref 7.5–12.5)
Monocytes Relative: 10.4 %
Neutro Abs: 6216 cells/uL (ref 1500–7800)
Neutrophils Relative %: 77.7 %
Platelets: 281 10*3/uL (ref 140–400)
RBC: 3.85 10*6/uL — ABNORMAL LOW (ref 4.20–5.80)
RDW: 19.3 % — ABNORMAL HIGH (ref 11.0–15.0)
Total Lymphocyte: 9.4 %
WBC: 8 10*3/uL (ref 3.8–10.8)

## 2020-02-28 LAB — COMPLETE METABOLIC PANEL WITH GFR
AG Ratio: 1.5 (calc) (ref 1.0–2.5)
ALT: 19 U/L (ref 9–46)
AST: 18 U/L (ref 10–35)
Albumin: 4 g/dL (ref 3.6–5.1)
Alkaline phosphatase (APISO): 138 U/L (ref 35–144)
BUN/Creatinine Ratio: 16 (calc) (ref 6–22)
BUN: 28 mg/dL — ABNORMAL HIGH (ref 7–25)
CO2: 30 mmol/L (ref 20–32)
Calcium: 8.5 mg/dL — ABNORMAL LOW (ref 8.6–10.3)
Chloride: 101 mmol/L (ref 98–110)
Creat: 1.74 mg/dL — ABNORMAL HIGH (ref 0.70–1.11)
GFR, Est African American: 42 mL/min/{1.73_m2} — ABNORMAL LOW (ref >=60)
GFR, Est Non African American: 36 mL/min/{1.73_m2} — ABNORMAL LOW (ref >=60)
Globulin: 2.7 g/dL (calc) (ref 1.9–3.7)
Glucose, Bld: 276 mg/dL — ABNORMAL HIGH (ref 65–99)
Potassium: 3.7 mmol/L (ref 3.5–5.3)
Sodium: 141 mmol/L (ref 135–146)
Total Bilirubin: 0.7 mg/dL (ref 0.2–1.2)
Total Protein: 6.7 g/dL (ref 6.1–8.1)

## 2020-02-28 LAB — PROTIME-INR
INR: 1.1
Prothrombin Time: 11 s (ref 9.0–11.5)

## 2020-03-11 ENCOUNTER — Other Ambulatory Visit: Payer: Self-pay

## 2020-03-11 ENCOUNTER — Encounter (INDEPENDENT_AMBULATORY_CARE_PROVIDER_SITE_OTHER): Payer: Medicare Other | Admitting: Ophthalmology

## 2020-03-11 DIAGNOSIS — H35033 Hypertensive retinopathy, bilateral: Secondary | ICD-10-CM

## 2020-03-11 DIAGNOSIS — H44752 Retained (nonmagnetic) (old) foreign body in vitreous body, left eye: Secondary | ICD-10-CM | POA: Diagnosis not present

## 2020-03-11 DIAGNOSIS — H34832 Tributary (branch) retinal vein occlusion, left eye, with macular edema: Secondary | ICD-10-CM

## 2020-03-11 DIAGNOSIS — H338 Other retinal detachments: Secondary | ICD-10-CM

## 2020-03-11 DIAGNOSIS — H43813 Vitreous degeneration, bilateral: Secondary | ICD-10-CM

## 2020-03-11 DIAGNOSIS — I10 Essential (primary) hypertension: Secondary | ICD-10-CM | POA: Diagnosis not present

## 2020-03-12 ENCOUNTER — Telehealth: Payer: Self-pay | Admitting: *Deleted

## 2020-03-12 NOTE — Progress Notes (Signed)
HEMATOLOGY/ONCOLOGY CONSULTATION NOTE  Date of Service: 03/12/2020  Patient Care Team: [name], MD as PCP - General (Internal Medicine) Inda Castle, MD (Inactive) as Consulting Physician (Gastroenterology) Martinique, Peter M, MD as Consulting Physician (Cardiology) Rexene Alberts, MD as Consulting Physician (Cardiothoracic Surgery) Szott, Prescott Gum, DDS as Referring Physician Hayden Pedro, MD as Consulting Physician (Ophthalmology) Lavonna Monarch, MD as Consulting Physician (Dermatology) Josue Hector, MD as Consulting Physician (Cardiology) Thompson Grayer, MD as Consulting Physician (Cardiology) Rosita Fire, MD as Consulting Physician (Nephrology) Bensimhon, Shaune Pascal, MD as Consulting Physician (Cardiology)  CHIEF COMPLAINTS/PURPOSE OF CONSULTATION:  Chronic anemia  HISTORY OF PRESENTING ILLNESS:   [name]. is a wonderful 80 y.o. male who has been referred to [place] by Liane Comber, NP for evaluation and management of chronic anemia. The pt reports that he is doing well overall.   The pt reports that he has known that he was anemic for awhile. He has been using 325 mg Ferrous Sulfate which was causing abdominal discomfort and constipation when taken daily and only showed minimal anemia improvement. Pt is now taking Ferrous Sulfate three times per week. He has never required IV Iron.   Pt has been on Coumadin for his Afib and on Aspirin for years and has noticed increased bruising. A few weeks ago the vision in his right eye began to blur and he was thought to have a vitreous hemorrhage. It has since resolved. They are currently lowering his Coumadin dosage, as it was a risk factor for the hemorrhage.   Pt is following with [name] for Cardiology and has a right-sided heart catheterization scheduled for tomorrow. Scheduling of the right heart cath began after pt began to experience increased shortness of breath. He has a history of  pulmonary hypertension.   He denies any recent gout flares.   Of note prior to the patient's visit today, pt has had Upper GI Endoscopy completed on  01/26/2017 with results revealing  "- Normal esophagus. 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 Injected. Treated with argon plasma coagulation (APC). Clip (MR conditional) was placed. - Normal examined duodenum. - No specimens collected."  Pt has had Colonoscopy completed on 01/26/2017 with results revealing  "- Decreased sphincter tone found on digital rectal exam. - Diverticulosis in the left colon. - One 15 mm polyp at the appendiceal orifice. Biopsied. - Internal hemorrhoids. - The examination was otherwise normal on direct and retroflexion views."  Most recent lab results (07/03/2019) of CBC & CMP is as follows: all values are WNL except for RBC at 3.74, Hgb at 9.5, HCT at 33.3, MCH at 25.4, MCHC at 28.5, RDW at 18.5, Potassium at 3.1, Chloride at 94, Glucose at 225, BUN at 35, Creatinine at 2.05, GFR Est Non Af Am at 30.  On review of systems, pt reports SOB, bruising, muscle cramps, dizziness and denies unexpected weight loss, bone pain and any other symptoms.   On PMHx the pt reports Atypical atrial flutter, BCC, CAD, Chronic diastolic congestive heart failure, CKD, DM Type II, Gout. HLD, HTN, Appendectomy, Cardiac Catheterization, CABG. 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    INTERVAL HISTORY:  [name]. is a wonderful 80 y.o. male who is here for evaluation and management of chronic anemia. The patient's last visit with [place] was on 09/13/2019. The pt reports that he is doing well overall. The pt received two IV Injectafer infusions  on 02/14 and 02/21.  The pt reports that he has been feeling better since his IV Iron. The pt also notes that 7 days ago he tripped and fell face down on his driveway at night. He is bruising greatly in his face under his eyes, but improving since the worst day of Day  3. The pt will have to get his implant positioned again as it got knocked out of position when he fell. The pt saw his GI recently and he will hold off on all workup and colonoscopy in future due to pt's condition and risk of sedation due to heart issues. The pt notes he was dizzy intermittently due to low glucose, but controls this with food intake. The pt is still on ASA and Coumadin. The pt notes he recently restarted Ferrous Sulfate once daily, but is having issues tolerating this. He started with 3 prior to decreasing this. The pt notes no new medication changes.  Lab results today 03/13/2020 of CBC w/diff and CMP is as follows: all values are WNL except for RBC of 4.14, Hgb of 11.6, HCT of 38.2, RDW of 23.4, Lymphs Abs of 0.5K. CMP pending. 03/13/2020 Ferritin is 523 03/13/2020 Iron saturation 29%  On review of systems, pt reports brusing and denies blood/black stools, GI bleeding, nose bleeds, gum bleeds, fatigue, abdominal pain, back pain, leg swelling, and any other symptoms.  MEDICAL HISTORY:  Past Medical History:  Diagnosis Date  . Adrenal adenoma   . Anemia   . Anxiety   . Arthritis   . Atypical atrial flutter ([place]) 8/15, 10/15   a. DCCV 08/2013. b. s/p RFA 10/2013.  [name] Basal cell carcinoma   . CAD (coronary artery disease)    a. 04/2013 CABG x 2: LIMA to LAD, SVG to RI, EVH via R thigh.  . Cellulitis 12/2015   left leg  . Chronic diastolic congestive heart failure ([place])   . CKD (chronic kidney disease), stage III ([place])   . Depression   . Diabetes mellitus type II   . Diverticulosis 2001  . DJD (degenerative joint disease)   . GERD (gastroesophageal reflux disease)   . Gout   . H/O hiatal hernia   . History of cardioversion    x3 (years uncertain)  . Hx of adenomatous colonic polyps   . Hyperlipidemia   . Hypertension   . Hypertensive cardiomyopathy ([place])   . Hypertensive retinopathy    OU  . Macular degeneration    OU  . Obstructive sleep apnea    compliant with  CPAP  . 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 Persistent atrial fibrillation ([place])    a. s/p MAZE 04/2013 in setting of CABG. b. Amio stopped in 10/2013 after flutter ablation.  [name] PFO (patent foramen ovale)    a. Small PFO by TEE 10/2013.  [name] Pleural effusion, left    a. s/p thoracentesis 05/2013.  [name] Respiratory failure ([place])    a. Hypoxia 10/2013 - required supp O2 as inpatient, did not require it at discharge.  . S/P Maze operation for atrial fibrillation    a. 04/2013: Complete bilateral atrial lesion set using cryothermy and bipolar radiofrequency ablation with clipping of LA appendage (@ time of CABG)  . S/P Maze operation for atrial fibrillation 04/05/2013   Complete bilateral atrial lesion set using cryothermy and bipolar radiofrequency ablation with clipping of LA appendage via median sternotomy approach  SURGICAL HISTORY: Past Surgical History:  Procedure Laterality Date  . APPENDECTOMY  03/05/2017   laproscopic  . ATRIAL FIBRILLATION ABLATION N/A 10/26/2013   Procedure: ATRIAL FIBRILLATION ABLATION;  Surgeon: Coralyn Mark, MD;  Location: [facility] CATH LAB;  Service: Cardiovascular;  Laterality: N/A;  . BASAL CELL CARCINOMA EXCISION     x3 on face  . CARDIAC CATHETERIZATION     myocardial bridge but no cad  . CARDIOVERSION N/A 08/23/2013   Procedure: CARDIOVERSION;  Surgeon: Sanda Klein, MD;  Location: [facility];  Service: Cardiovascular;  Laterality: N/A;  . CARPOMETACARPEL SUSPENSION PLASTY Left 02/14/2014   Procedure: CARPOMETACARPEL ([place]) SUSPENSIONPLASTY THUMB  WITH  ABDUCTOR POLLICIS LONGUS TRANSFER AND STENOSING TENOSYNOVITIS RELEASE LEFT WRIST;  Surgeon: Charlotte Crumb, MD;  Location: [facility];  Service: Orthopedics;  Laterality: Left;  . CATARACT EXTRACTION Bilateral   . COLONOSCOPY WITH PROPOFOL N/A 01/26/2017   Procedure: COLONOSCOPY WITH  PROPOFOL;  Surgeon: Doran Stabler, MD;  Location: [facility];  Service: Gastroenterology;  Laterality: N/A;  . CORONARY ARTERY BYPASS GRAFT N/A 04/05/2013   Procedure: CORONARY ARTERY BYPASS GRAFTING (CABG) TIMES TWO USING LEFT INTERNAL MAMMARY ARTERY AND RIGHT SAPHENOUS LEG VEIN HARVESTED ENDOSCOPICALLY;  Surgeon: Rexene Alberts, MD;  Location: [facility];  Service: Open Heart Surgery;  Laterality: N/A;  . ESOPHAGOGASTRODUODENOSCOPY (EGD) WITH PROPOFOL N/A 01/26/2017   Procedure: ESOPHAGOGASTRODUODENOSCOPY (EGD) WITH PROPOFOL;  Surgeon: Doran Stabler, MD;  Location: [facility];  Service: Gastroenterology;  Laterality: N/A;  . EYE SURGERY Bilateral    Cat Sx  . EYE SURGERY Right    RD repair - SB  . GREAT TOE ARTHRODESIS, INTERPHALANGEAL JOINT     Right foot  . INTRAOPERATIVE TRANSESOPHAGEAL ECHOCARDIOGRAM N/A 04/05/2013   Procedure: INTRAOPERATIVE TRANSESOPHAGEAL ECHOCARDIOGRAM;  Surgeon: Rexene Alberts, MD;  Location: [facility];  Service: Open Heart Surgery;  Laterality: N/A;  . LAPAROSCOPIC APPENDECTOMY N/A 03/05/2017   Procedure: APPENDECTOMY LAPAROSCOPIC;  Surgeon: Judeth Horn, MD;  Location: [employer] [facility];  Service: General;  Laterality: N/A;  . LEFT HEART CATHETERIZATION WITH CORONARY ANGIOGRAM N/A 03/07/2013   Procedure: LEFT HEART CATHETERIZATION WITH CORONARY ANGIOGRAM;  Surgeon: Burnell Blanks, MD;  Location: [facility] CATH LAB;  Service: Cardiovascular;  Laterality: N/A;  . MAZE N/A 04/05/2013   Procedure: MAZE;  Surgeon: Rexene Alberts, MD;  Location: [facility];  Service: Open Heart Surgery;  Laterality: N/A;  . Polinydal cyst     Removed  . POLYPECTOMY    . Retina repair-right    . RETINAL DETACHMENT SURGERY Right    RD Repair - SB  . RIGHT HEART CATH N/A 07/06/2019   Procedure: RIGHT HEART CATH;  Surgeon: Jolaine Artist, MD;  Location: [facility] CV LAB;  Service: Cardiovascular;  Laterality: N/A;  . RIGHT HEART CATHETERIZATION N/A 05/03/2014   Procedure: RIGHT HEART CATH;  Surgeon:  Jolaine Artist, MD;  Location: [facility] CATH LAB;  Service: Cardiovascular;  Laterality: N/A;  . TEE WITHOUT CARDIOVERSION N/A 08/23/2013   Procedure: TRANSESOPHAGEAL ECHOCARDIOGRAM (TEE);  Surgeon: Sanda Klein, MD;  Location: [facility] ENDOSCOPY;  Service: Cardiovascular;  Laterality: N/A;  . TEE WITHOUT CARDIOVERSION N/A 10/26/2013   Procedure: TRANSESOPHAGEAL ECHOCARDIOGRAM (TEE);  Surgeon: Sueanne Margarita, MD;  Location: [facility] ENDOSCOPY;  Service: Cardiovascular;  Laterality: N/A;  . TEE WITHOUT CARDIOVERSION N/A 06/05/2014   Procedure: TRANSESOPHAGEAL ECHOCARDIOGRAM (TEE);  Surgeon: Thayer Headings, MD;  Location: [facility];  Service: Cardiovascular;  Laterality: N/A;  . TRAPEZIUM RESECTION  SOCIAL HISTORY: Social History   Socioeconomic History  . Marital status: Married    Spouse name: Not on file  . Number of children: 1  . Years of education: Not on file  . Highest education level: Not on file  Occupational History  . Occupation: retired Software engineer  Tobacco Use  . Smoking status: Former Smoker    Packs/day: 4.00    Years: 25.00    Pack years: 100.00    Types: Cigarettes    Quit date: 01/05/1981    Years since quitting: 39.2  . Smokeless tobacco: Never Used  Vaping Use  . Vaping Use: Never used  Substance and Sexual Activity  . Alcohol use: Yes    Alcohol/week: 1.0 standard drink    Types: 1 Shots of liquor per week    Comment: 1-5 drinks per week  . Drug use: No  . Sexual activity: Not on file  Other Topics Concern  . Not on file  Social History Narrative   Daily caffeine-yes   Patient gets regular exercise.   Pt lives in [place] with spouse.  Retired Software engineer.  Social Determinants of Health   Financial Resource Strain: Not on file  Food Insecurity: Not on file  Transportation Needs: Not on file  Physical Activity: Not on file  Stress: Not on file  Social Connections: Not on file  Intimate Partner Violence: Not on file    FAMILY HISTORY: Family History  Problem Relation Age of Onset  . Dementia Father   . Colon cancer Mother        Family History/Uncle   . Colon polyps Mother        Family History  . Atrial fibrillation Mother   . Hypertension Mother   . Colon polyps Sister        Family history  . Diabetes Maternal Uncle   . Stroke Paternal Uncle     ALLERGIES:  is allergic to sunflower seed [sunflower oil], horse-derived products, tetanus toxoids, and tetanus toxoid.  MEDICATIONS:  Current Outpatient Medications  Medication Sig Dispense Refill  . ACCU-CHEK AVIVA PLUS test strip CHECK BLOOD GLUCOSE 3 TIMES DAILY 300 strip 3  . acetaminophen (TYLENOL) 500 MG tablet Take 1,000 mg by mouth every 6 (six) hours as needed for moderate pain or headache.     . allopurinol (ZYLOPRIM) 300 MG tablet Take 300 mg by mouth daily.    [name] ALPRAZolam (XANAX) 1 MG tablet Take 0.5  tablets (0.5 mg total) by mouth at bedtime as needed for sleep. 60 tablet 0  . Alum Hydroxide-Mag Carbonate (GAVISCON PO) Take 4 tablets by mouth daily as needed (acid reflux).     [name] aspirin EC 81 MG tablet Take 81 mg by mouth daily.    [name] atorvastatin (LIPITOR) 20 MG tablet Take      1 tablet       3 x /week       for Cholesterol 39 tablet 0  . azelastine (ASTELIN) 0.1 % nasal spray Place 2 sprays into both nostrils 2 (two) times daily as needed for rhinitis or allergies.    . B Complex Vitamins (VITAMIN B COMPLEX PO) Take 1 tablet by mouth at bedtime.     . bacitracin 500 UNIT/GM ointment Apply 1 application topically daily as needed for wound care.    . Blood Glucose Monitoring Suppl (ACCU-CHEK AVIVA PLUS) w/Device KIT Check blood sugar 1 time  daily 1 kit 0  . Blood Glucose Monitoring Suppl DEVI Test blood sugar up to three times a day or as directed. 1 each 0  . carvedilol (COREG) 6.25 MG tablet Take 0.5 tablets (3.125 mg total) by mouth in the morning AND 1 tablet (6.25 mg total) every evening. 270 tablet 1  . Cholecalciferol (VITAMIN D PO) Take 5,000 Units by mouth daily.     . diphenhydrAMINE (BENADRYL) 25 MG tablet Take 25 mg by mouth at bedtime as needed for allergies.    . fluticasone (FLONASE) 50 MCG/ACT nasal spray Place 1 spray into both nostrils daily as needed for allergies. 16 g 3  . insulin NPH-regular Human (70-30) 100 UNIT/ML injection Inject 25-45 Units into the skin See admin instructions. 45 units in the morning and 30 units in the evening    . loratadine (CLARITIN) 10 MG tablet Take 10 mg by mouth daily as needed for allergies.     . metolazone (ZAROXOLYN) 2.5 MG tablet TAKE 1 TABLET BY MOUTH  DAILY AS NEEDED FOR  SWELLING (Patient taking differently: Take 2.5 mg by mouth daily as needed (swelling).) 60 tablet 0  . Multiple Vitamins-Minerals (PRESERVISION AREDS 2) CAPS Take  1 capsule by mouth 2 (two) times daily.     . polyvinyl alcohol (LIQUIFILM TEARS) 1.4 % ophthalmic  solution Place 1 drop into both eyes daily as needed for dry eyes.    . potassium chloride SA (KLOR-CON) 20 MEQ tablet Take      2 tablets       3 x /day       for Potassium 540 tablet 0  . sertraline (ZOLOFT) 100 MG tablet Take 1 tablet Daily for Mood (Patient taking differently: Take 100 mg by mouth daily. Take 1 tablet Daily for Mood) 90 tablet 3  . torsemide (DEMADEX) 100 MG tablet Take 150 mg by mouth daily.    [name] warfarin (COUMADIN) 5 MG tablet Take    1.5 tablets /day      or as directed 135 tablet 3   No current facility-administered medications for this visit.    REVIEW OF SYSTEMS:   10 Point review of Systems was done is negative except as noted above.  PHYSICAL EXAMINATION: ECOG PERFORMANCE STATUS: 1 - Symptomatic but completely ambulatory  . Vitals:   03/13/20 1203  BP: 137/79  Pulse: 88  Resp: 18  Temp: (!) 97.5 F (36.4 C)  SpO2: 99%   Filed Weights   03/13/20 1203  Weight: 182 lb 12.8 oz (82.9 kg)   .Body mass index is 25.5 kg/m. Exam was given in a chair.   GENERAL:alert, in no acute distress and comfortable SKIN: no acute rashes, no significant lesions EYES: conjunctiva are pink and non-injected, sclera anicteric OROPHARYNX: MMM, no exudates, no oropharyngeal erythema or ulceration NECK: supple, no JVD LYMPH:  no palpable lymphadenopathy in the cervical, axillary or inguinal regions LUNGS: clear to auscultation b/l with normal respiratory effort HEART: regular rate & rhythm ABDOMEN:  normoactive bowel sounds , non tender, not distended. Extremity: no pedal edema PSYCH: alert & oriented x 3 with fluent speech NEURO: no focal motor/sensory deficits   LABORATORY DATA:  I have reviewed the data as listed  . CBC Latest Ref Rng & Units 03/13/2020 02/27/2020 02/08/2020  WBC 4.0 - 10.5 K/uL 7.0 8.0 8.4  Hemoglobin 13.0 - 17.0 g/dL 11.6(L) 10.3(L) 8.7(L)  Hematocrit 39.0 - 52.0 % 38.2(L) 33.9(L) 28.6(L)  Platelets 150 - 400 K/uL 213 281 256   . CBC     Component Value Date/Time   WBC 8.0 02/27/2020 1643   RBC 3.85 (L) 02/27/2020 1643   HGB 10.3 (L) 02/27/2020 1643   HCT 33.9 (L) 02/27/2020 1643   HCT 30.3 (L) 07/05/2019 1416   PLT 281 02/27/2020 1643   MCV 88.1 02/27/2020 1643   MCH 26.8 (L) 02/27/2020 1643   MCHC 30.4 (L) 02/27/2020 1643   RDW 19.3 (H) 02/27/2020 1643   LYMPHSABS 752 (L) 02/27/2020 1643   MONOABS 0.7 09/13/2019 0950   EOSABS 152 02/27/2020 1643   BASOSABS 48 02/27/2020 1643     . CMP Latest Ref Rng & Units 02/27/2020 02/06/2020 10/11/2019  Glucose 65 - 99 mg/dL 276(H) 145(H) 146(H)  BUN 7 - 25 mg/dL 28(H) 27(H) 39(H)  Creatinine 0.70 - 1.11 mg/dL 1.74(H) 1.69(H) 1.93(H)  Sodium 135 - 146 mmol/L 141 141 143  Potassium 3.5 - 5.3 mmol/L 3.7 4.2 4.0  Chloride 98 - 110 mmol/L 101 101 103  CO2 20 - 32 mmol/L 30 30 34(H)  Calcium 8.6 - 10.3 mg/dL 8.5(L) 9.0 9.3  Total Protein 6.1 - 8.1 g/dL 6.7 7.0 6.9  Total Bilirubin 0.2 - 1.2 mg/dL 0.7 0.5  0.6  Alkaline Phos 38 - 126 U/L - - -  AST 10 - 35 U/L _0 ALT 9 - 46 U/L _1 . Lab Results  Component Value Date   IRON 86 03/13/2020   TIBC 298 03/13/2020   IRONPCTSAT 29 03/13/2020   (Iron and TIBC)  Lab Results  Component Value Date   FERRITIN 523 (H) 03/13/2020     RADIOGRAPHIC STUDIES: I have personally reviewed the radiological images as listed and agreed with the findings in the report.   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  PLAN: -Discussed pt labwork today, 03/13/2020; Hgb improved.  -Advised pt that ASA and Coumadin can increase risk of bleeding.  -Advised pt we will tentatively set him up for IV Iron every 3 months at this time. Will continue to monitor. -Recommended pt hold OTC iron at this time due to toleration issues and greater demand. -Will see back in 3 months with  labs.   FOLLOW UP: RTC with Dr Irene Limbo with labs and IV Injectafer in 3 months    The total time spent in the appointment was 20 minutes and more than 50% was on counseling and direct patient cares.   All of the patient's questions were answered with apparent satisfaction. The patient knows to call the clinic with any problems, questions or concerns.    Sullivan Lone MD [place] AAHIVMS [facility] Hematology/Oncology Physician [facility]  (Office):       [phone] (Work cell):  [phone] (Fax):           [phone]  03/12/2020 1:48 PM  I, Reinaldo Raddle, am acting as scribe for Dr. Sullivan Lone, MD.    .I have reviewed the above documentation for accuracy and completeness, and I agree with the above. Brunetta Genera MD

## 2020-03-12 NOTE — Telephone Encounter (Signed)
Called patient after receiving information regarding a fall patient had on 03/06/2020. Patient fell on his face, has very bruised eyes and displaced one of his cataract implants. He was seen by Dr Tempie Hoist and he advised x-rays of head. Dr Melford Aase advised patient get a CT of head and patient went to [facility] this PM. They do not do CT Scans. Patient has OV with Dr Melford Aase 03/15/2020. Patient states no symptoms of trama and would like to wait until OV. Dr Melford Aase is aware.

## 2020-03-13 ENCOUNTER — Other Ambulatory Visit: Payer: Self-pay

## 2020-03-13 ENCOUNTER — Inpatient Hospital Stay: Payer: Medicare Other | Attending: Hematology

## 2020-03-13 ENCOUNTER — Inpatient Hospital Stay: Payer: Medicare Other | Admitting: Hematology

## 2020-03-13 VITALS — BP 137/79 | HR 88 | Temp 97.5°F | Resp 18 | Ht 71.0 in | Wt 182.8 lb

## 2020-03-13 DIAGNOSIS — Z79899 Other long term (current) drug therapy: Secondary | ICD-10-CM | POA: Insufficient documentation

## 2020-03-13 DIAGNOSIS — I2510 Atherosclerotic heart disease of native coronary artery without angina pectoris: Secondary | ICD-10-CM | POA: Insufficient documentation

## 2020-03-13 DIAGNOSIS — M109 Gout, unspecified: Secondary | ICD-10-CM | POA: Diagnosis not present

## 2020-03-13 DIAGNOSIS — Z7901 Long term (current) use of anticoagulants: Secondary | ICD-10-CM | POA: Insufficient documentation

## 2020-03-13 DIAGNOSIS — N183 Chronic kidney disease, stage 3 unspecified: Secondary | ICD-10-CM | POA: Insufficient documentation

## 2020-03-13 DIAGNOSIS — Z85828 Personal history of other malignant neoplasm of skin: Secondary | ICD-10-CM | POA: Insufficient documentation

## 2020-03-13 DIAGNOSIS — D509 Iron deficiency anemia, unspecified: Secondary | ICD-10-CM | POA: Diagnosis not present

## 2020-03-13 DIAGNOSIS — Z87891 Personal history of nicotine dependence: Secondary | ICD-10-CM | POA: Diagnosis not present

## 2020-03-13 DIAGNOSIS — M129 Arthropathy, unspecified: Secondary | ICD-10-CM | POA: Diagnosis not present

## 2020-03-13 DIAGNOSIS — E119 Type 2 diabetes mellitus without complications: Secondary | ICD-10-CM | POA: Diagnosis not present

## 2020-03-13 DIAGNOSIS — R0602 Shortness of breath: Secondary | ICD-10-CM | POA: Diagnosis not present

## 2020-03-13 DIAGNOSIS — K219 Gastro-esophageal reflux disease without esophagitis: Secondary | ICD-10-CM | POA: Insufficient documentation

## 2020-03-13 DIAGNOSIS — I4891 Unspecified atrial fibrillation: Secondary | ICD-10-CM | POA: Diagnosis not present

## 2020-03-13 DIAGNOSIS — F419 Anxiety disorder, unspecified: Secondary | ICD-10-CM | POA: Insufficient documentation

## 2020-03-13 DIAGNOSIS — Z800 Family history of malignant neoplasm of digestive organs: Secondary | ICD-10-CM | POA: Insufficient documentation

## 2020-03-13 DIAGNOSIS — Z7982 Long term (current) use of aspirin: Secondary | ICD-10-CM | POA: Diagnosis not present

## 2020-03-13 DIAGNOSIS — D649 Anemia, unspecified: Secondary | ICD-10-CM

## 2020-03-13 LAB — CBC WITH DIFFERENTIAL/PLATELET
Abs Immature Granulocytes: 0.03 10*3/uL (ref 0.00–0.07)
Basophils Absolute: 0.1 10*3/uL (ref 0.0–0.1)
Basophils Relative: 1 %
Eosinophils Absolute: 0.1 10*3/uL (ref 0.0–0.5)
Eosinophils Relative: 2 %
HCT: 38.2 % — ABNORMAL LOW (ref 39.0–52.0)
Hemoglobin: 11.6 g/dL — ABNORMAL LOW (ref 13.0–17.0)
Immature Granulocytes: 0 %
Lymphocytes Relative: 7 %
Lymphs Abs: 0.5 10*3/uL — ABNORMAL LOW (ref 0.7–4.0)
MCH: 28 pg (ref 26.0–34.0)
MCHC: 30.4 g/dL (ref 30.0–36.0)
MCV: 92.3 fL (ref 80.0–100.0)
Monocytes Absolute: 0.5 10*3/uL (ref 0.1–1.0)
Monocytes Relative: 7 %
Neutro Abs: 5.8 10*3/uL (ref 1.7–7.7)
Neutrophils Relative %: 83 %
Platelets: 213 10*3/uL (ref 150–400)
RBC: 4.14 MIL/uL — ABNORMAL LOW (ref 4.22–5.81)
RDW: 23.4 % — ABNORMAL HIGH (ref 11.5–15.5)
WBC: 7 10*3/uL (ref 4.0–10.5)
nRBC: 0 % (ref 0.0–0.2)

## 2020-03-13 LAB — CMP (CANCER CENTER ONLY)
ALT: 22 U/L (ref 0–44)
AST: 20 U/L (ref 15–41)
Albumin: 4 g/dL (ref 3.5–5.0)
Alkaline Phosphatase: 127 U/L — ABNORMAL HIGH (ref 38–126)
Anion gap: 12 (ref 5–15)
BUN: 34 mg/dL — ABNORMAL HIGH (ref 8–23)
CO2: 30 mmol/L (ref 22–32)
Calcium: 8.9 mg/dL (ref 8.9–10.3)
Chloride: 102 mmol/L (ref 98–111)
Creatinine: 1.92 mg/dL — ABNORMAL HIGH (ref 0.61–1.24)
GFR, Estimated: 35 mL/min — ABNORMAL LOW (ref >60)
Glucose, Bld: 123 mg/dL — ABNORMAL HIGH (ref 70–99)
Potassium: 3.2 mmol/L — ABNORMAL LOW (ref 3.5–5.1)
Sodium: 144 mmol/L (ref 135–145)
Total Bilirubin: 0.7 mg/dL (ref 0.3–1.2)
Total Protein: 7.6 g/dL (ref 6.5–8.1)

## 2020-03-13 LAB — IRON AND TIBC
Iron: 86 ug/dL (ref 42–163)
Saturation Ratios: 29 % (ref 20–55)
TIBC: 298 ug/dL (ref 202–409)
UIBC: 212 ug/dL (ref 117–376)

## 2020-03-13 LAB — FERRITIN: Ferritin: 523 ng/mL — ABNORMAL HIGH (ref 24–336)

## 2020-03-13 NOTE — Progress Notes (Signed)
History of Present Illness:     Patient is a very nice 80 yo MWM with HTN, CAD, /CABG, chr Afib/Flutter, Pulm_HTN, on Warfarin/bASA, chronic FeDef Anemia     Relates hx/o of tripping with head injury & displacement of his Rt IOL as evaluated by Dr Zigmund Daniel.  Patient denies presyncopal sx's, vertigo or LOC. Apparently, he  developed bilat "racoon eyes" from  the blow to his head.  He denies any subsequent neuro signs/sx's, vertigo or postural presyncopal sx's.   Medications  Current Outpatient Medications (Endocrine & Metabolic):  .  insulin NPH-regular Human (70-30) 100 UNIT/ML injection, Inject 25-45 Units into the skin See admin instructions. 45 units in the morning and 30 units in the evening  Current Outpatient Medications (Cardiovascular):  .  atorvastatin (LIPITOR) 20 MG tablet, Take      1 tablet       3 x /week       for Cholesterol .  carvedilol (COREG) 6.25 MG tablet, Take 0.5 tablets (3.125 mg total) by mouth in the morning AND 1 tablet (6.25 mg total) every evening. .  metolazone (ZAROXOLYN) 2.5 MG tablet, TAKE 1 TABLET BY MOUTH  DAILY AS NEEDED FOR  SWELLING (Patient taking differently: Take 2.5 mg by mouth daily as needed (swelling).) .  torsemide (DEMADEX) 100 MG tablet, Take 150 mg by mouth daily.  Current Outpatient Medications (Respiratory):  .  azelastine (ASTELIN) 0.1 % nasal spray, Place 2 sprays into both nostrils 2 (two) times daily as needed for rhinitis or allergies. .  diphenhydrAMINE (BENADRYL) 25 MG tablet, Take 25 mg by mouth at bedtime as needed for allergies. [name]  loratadine (CLARITIN) 10 MG tablet, Take 10 mg by mouth daily as needed for allergies.  .  fluticasone (FLONASE) 50 MCG/ACT nasal spray, Place 1 spray into both nostrils daily as needed for allergies.  Current Outpatient Medications (Analgesics):  .  acetaminophen (TYLENOL) 500 MG tablet, Take 1,000 mg by mouth every 6 (six) hours as needed for moderate pain or headache.  .  allopurinol (ZYLOPRIM)  300 MG tablet, Take 300 mg by mouth daily. [name]  aspirin EC 81 MG tablet, Take 81 mg by mouth daily.  Current Outpatient Medications (Hematological):  .  warfarin (COUMADIN) 5 MG tablet, Take    1.5 tablets /day      or as directed  Current Outpatient Medications (Other):  [name]  ACCU-CHEK AVIVA PLUS test strip, CHECK BLOOD GLUCOSE 3 TIMES DAILY .  ALPRAZolam (XANAX) 1 MG tablet, Take 0.5 tablets (0.5 mg total) by mouth at bedtime as needed for sleep. [name]  Alum Hydroxide-Mag Carbonate (GAVISCON PO), Take 4 tablets by mouth daily as needed (acid reflux).  .  Ascorbic Acid (VITAMIN C) 1000 MG tablet, Take 1,000 mg by mouth daily. .  B Complex Vitamins (VITAMIN B COMPLEX PO), Take 1 tablet by mouth at bedtime.  .  bacitracin 500 UNIT/GM ointment, Apply 1 application topically daily as needed for wound care. .  Blood Glucose Monitoring Suppl (ACCU-CHEK AVIVA PLUS) w/Device KIT, Check blood sugar 1 time  daily .  Blood Glucose Monitoring Suppl DEVI, Test blood sugar up to three times a day or as directed. .  Cholecalciferol (VITAMIN D PO), Take 5,000 Units by mouth daily.  .  Multiple Vitamins-Minerals (PRESERVISION AREDS 2) CAPS, Take 1 capsule by mouth 2 (two) times daily.  .  polyvinyl alcohol (LIQUIFILM TEARS) 1.4 % ophthalmic solution, Place 1 drop into both eyes daily as needed for dry eyes. [name]  potassium chloride SA (KLOR-CON) 20 MEQ tablet, Take      2 tablets       3 x /day       for Potassium .  sertraline (ZOLOFT) 100 MG tablet, Take 1 tablet Daily for Mood (Patient taking differently: Take 100 mg by mouth daily. Take 1 tablet Daily for Mood) .  zinc gluconate 50 MG tablet, Take 50 mg by mouth daily.  Problem list He has Hyperlipidemia associated with type 2 diabetes mellitus ([place]); PAF (paroxysmal atrial fibrillation) ([place]); Diverticulosis of large intestine; Colonic Polyps; Chronic diastolic congestive heart failure ([place]); Hypertensive cardiomyopathy ([place]); S/P CABG x 2 and maze procedure- April  2015; Type 2 diabetes mellitus with stage 4 chronic kidney disease, with long-term current use of insulin ([place]); GERD; DJD (degenerative joint disease); Vitamin D deficiency; Chronic anticoagulation (INR goal 2.0-2.5); Essential hypertension; CAD (coronary artery disease); PFO (patent foramen ovale); 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[place]); OSA on CPAP; S/P Maze operation for atrial fibrillation; Pulmonary hypertension ([place]); 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   Observations/Objective:  BP 120/76   Pulse 74   Temp 97.9 F (36.6 C)   Resp 16   Ht 5' 11" (1.803 m)   Wt 185 lb 6.4 oz (84.1 kg)   SpO2 99%   BMI 25.86 kg/m   HEENT - WNL. Resolving bilateral peri-orbital ecchymoses.  Neck - supple.  Chest - Clear equal BS. Cor - Nl HS. RRR w/o sig MGR. PP 1(+). No edema. MS- FROM w/o deformities.  Gait Nl. Neuro -  Nl w/o focal abnormalities.  Assessment and Plan:   1. Contusion of periorbital region  - discussed observation for neuro sign's and sx's and ROV as needed with Strict ER precautions.   Follow Up Instructions:        I discussed the assessment and treatment plan with the patient. The patient was provided an opportunity to ask questions and all were answered. The patient agreed with the plan and demonstrated an understanding of the instructions.       The patient was advised to call back or seek an in-person evaluation if the symptoms worsen or if the condition fails to improve as anticipated.   Kirtland Bouchard, MD

## 2020-03-13 NOTE — Progress Notes (Incomplete)
History of Present Illness:     Patient is a very nice 80 yo MWM with HTN, CAD, /CABG, chr Afib/Flutter, Pulm_HTN, on Warfarin/bASA, chronic FeDef Anemia     Relates hx/o of tripping with head injury & displacement of his     IOL and was evaluated by Dr Zigmund Daniel.      Medications  Current Outpatient Medications (Endocrine & Metabolic):  .  insulin NPH-regular Human (70-30) 100 UNIT/ML injection, Inject 25-45 Units into the skin See admin instructions. 45 units in the morning and 30 units in the evening  Current Outpatient Medications (Cardiovascular):  .  atorvastatin (LIPITOR) 20 MG tablet, Take      1 tablet       3 x /week       for Cholesterol .  carvedilol (COREG) 6.25 MG tablet, Take 0.5 tablets (3.125 mg total) by mouth in the morning AND 1 tablet (6.25 mg total) every evening. .  metolazone (ZAROXOLYN) 2.5 MG tablet, TAKE 1 TABLET BY MOUTH  DAILY AS NEEDED FOR  SWELLING (Patient taking differently: Take 2.5 mg by mouth daily as needed (swelling).) .  torsemide (DEMADEX) 100 MG tablet, Take 150 mg by mouth daily.  Current Outpatient Medications (Respiratory):  .  azelastine (ASTELIN) 0.1 % nasal spray, Place 2 sprays into both nostrils 2 (two) times daily as needed for rhinitis or allergies. .  diphenhydrAMINE (BENADRYL) 25 MG tablet, Take 25 mg by mouth at bedtime as needed for allergies. .  fluticasone (FLONASE) 50 MCG/ACT nasal spray, Place 1 spray into both nostrils daily as needed for allergies. [name]  loratadine (CLARITIN) 10 MG tablet, Take 10 mg by mouth daily as needed for allergies.   Current Outpatient Medications (Analgesics):  .  acetaminophen (TYLENOL) 500 MG tablet, Take 1,000 mg by mouth every 6 (six) hours as needed for moderate pain or headache.  .  allopurinol (ZYLOPRIM) 300 MG tablet, Take 300 mg by mouth daily. [name]  aspirin EC 81 MG tablet, Take 81 mg by mouth daily.  Current Outpatient Medications (Hematological):  .  warfarin (COUMADIN) 5 MG tablet, Take     1.5 tablets /day      or as directed  Current Outpatient Medications (Other):  [name]  ACCU-CHEK AVIVA PLUS test strip, CHECK BLOOD GLUCOSE 3 TIMES DAILY .  ALPRAZolam (XANAX) 1 MG tablet, Take 0.5 tablets (0.5 mg total) by mouth at bedtime as needed for sleep. [name]  Alum Hydroxide-Mag Carbonate (GAVISCON PO), Take 4 tablets by mouth daily as needed (acid reflux).  .  B Complex Vitamins (VITAMIN B COMPLEX PO), Take 1 tablet by mouth at bedtime.  .  bacitracin 500 UNIT/GM ointment, Apply 1 application topically daily as needed for wound care. .  Blood Glucose Monitoring Suppl (ACCU-CHEK AVIVA PLUS) w/Device KIT, Check blood sugar 1 time  daily .  Blood Glucose Monitoring Suppl DEVI, Test blood sugar up to three times a day or as directed. .  Cholecalciferol (VITAMIN D PO), Take 5,000 Units by mouth daily.  .  Multiple Vitamins-Minerals (PRESERVISION AREDS 2) CAPS, Take 1 capsule by mouth 2 (two) times daily.  .  polyvinyl alcohol (LIQUIFILM TEARS) 1.4 % ophthalmic solution, Place 1 drop into both eyes daily as needed for dry eyes. .  potassium chloride SA (KLOR-CON) 20 MEQ tablet, Take      2 tablets       3 x /day       for Potassium .  sertraline (ZOLOFT) 100 MG tablet, Take 1  tablet Daily for Mood (Patient taking differently: Take 100 mg by mouth daily. Take 1 tablet Daily for Mood)  Problem list He has Hyperlipidemia associated with type 2 diabetes mellitus ([place]); PAF (paroxysmal atrial fibrillation) ([place]); Diverticulosis of large intestine; Colonic Polyps; Chronic diastolic congestive heart failure ([place]); Hypertensive cardiomyopathy ([place]); 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; Type 2 diabetes mellitus with stage 4 chronic kidney disease, with long-term current use of insulin ([place]); GERD; DJD (degenerative joint disease); Vitamin D deficiency; Chronic anticoagulation (INR goal 2.0-2.5); Essential hypertension; CAD (coronary artery disease); PFO (patent foramen ovale); Major depression in full  remission ([place]); OSA on CPAP; S/P Maze operation for atrial fibrillation; Pulmonary hypertension ([place]); 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   Observations/Objective:  There were no vitals taken for this visit.  HEENT - WNL. Neck - supple.  Chest - Clear equal BS. Cor - Nl HS. RRR w/o sig MGR. PP 1(+). No edema. MS- FROM w/o deformities.  Gait Nl. Neuro -  Nl w/o focal abnormalities.    Assessment and Plan:      Follow Up Instructions:    I discussed the assessment and treatment plan with the patient. The patient was provided an opportunity to ask questions and all were answered. The patient agreed with the plan and demonstrated an understanding of the instructions.   The patient was advised to call back or seek an in-person evaluation if the symptoms worsen or if the condition fails to improve as anticipated.    Kirtland Bouchard, MD

## 2020-03-14 ENCOUNTER — Ambulatory Visit (INDEPENDENT_AMBULATORY_CARE_PROVIDER_SITE_OTHER): Payer: Medicare Other | Admitting: Internal Medicine

## 2020-03-14 ENCOUNTER — Other Ambulatory Visit: Payer: Self-pay | Admitting: *Deleted

## 2020-03-14 VITALS — BP 120/76 | HR 74 | Temp 97.9°F | Resp 16 | Ht 71.0 in | Wt 185.4 lb

## 2020-03-14 DIAGNOSIS — S0010XA Contusion of unspecified eyelid and periocular area, initial encounter: Secondary | ICD-10-CM | POA: Diagnosis not present

## 2020-03-14 MED ORDER — FLUTICASONE PROPIONATE 50 MCG/ACT NA SUSP: 1.0000 | Freq: Every day | NASAL | 3 refills | Status: AC | PRN

## 2020-03-15 ENCOUNTER — Encounter: Payer: Self-pay | Admitting: Internal Medicine

## 2020-03-15 DIAGNOSIS — H5203 Hypermetropia, bilateral: Secondary | ICD-10-CM | POA: Diagnosis not present

## 2020-03-15 DIAGNOSIS — Z961 Presence of intraocular lens: Secondary | ICD-10-CM | POA: Diagnosis not present

## 2020-03-16 ENCOUNTER — Other Ambulatory Visit: Payer: Self-pay | Admitting: Internal Medicine

## 2020-03-17 ENCOUNTER — Other Ambulatory Visit (HOSPITAL_COMMUNITY): Payer: Self-pay | Admitting: Internal Medicine

## 2020-03-25 NOTE — Progress Notes (Signed)
ONE MONTH FOLLOW UP   Assessment and Plan:  PAF (paroxysmal atrial fibrillation) (HCC) Taking Coumadin 1.5 tablets daily in evening Goal 2-2.5 Check INR and will adjust medication according to labs in 4 weeks pending results  Discussed if patient falls to immediately contact office or go to ER. Discussed foods that can increase or decrease Coumadin levels.  Acquired thrombophilia (HCC) No S&S of bleeding Discussed hospital precautions  Chronic diastolic congestive heart failure ([place]) Encouraged daily monitoring of the patient's weight Continue follow up with cardiology Continue BB,torsemide/PRN metolazone, potassium  Senile purpura ([place]) Discussed process, protect skin, sunscreen  Essential Hypertension Continue current medications Monitor blood pressure at home; call if consistently over 130/80 Continue DASH diet.   Reminder to go to the ER if any CP, SOB, nausea, dizziness, severe HA, changes vision/speech, left arm numbness and tingling and jaw pain.  Persistent R nasal foreign body sensation/congestion Recurrent R nose bleed No improvement with antihistamine, nasal steroid persistent 6+ months; will refer to ENT to r/o polyp Recurrent R nose bleeds since childhood; recently increased on coumadin and using topical steroid; may be able to cauterize    Further disposition pending results of labs. Discussed med's effects and SE's.   Over 20 minutes of face to face interview exam, counseling, chart review, and critical decision making was performed.   Future Appointments  Date Time Provider [place]  04/08/2020 12:45 PM Hayden Pedro, MD [facility] None  04/30/2020  4:00 PM [name], MD [facility] None  06/07/2020 12:30 PM Hayden Pedro, MD [facility] None  06/11/2020 10:00 AM [facility] LAB [facility] None  06/11/2020 10:40 AM Brunetta Genera, MD [facility] None  06/11/2020 11:45 AM [facility] INFUSION [facility] None  10/23/2020 11:00 AM [name], MD [facility] None  02/26/2021  3:30 PM McClanahan, Danton Sewer, NP [facility] None    ------------------------------------------------------------------------------------------------------------------   HPI BP 132/74   Pulse 75   Temp (!) 97.3 F (36.3 C)   Wt 187 lb 14.4 oz (85.2 kg)   SpO2 95%   BMI 26.21 kg/m   80 y.o.male presents for follow up on coumadin for p. A. fib, CHF, HTN, CKD III/IV.  Today he reports persistent sensation of R nare congestions and foreign body sensation "something flapping in there" x 6 months; not improving with antihistamine, consistent topical steroid use; also notes recurrent R nose bleeds since childhood, worse on coumadin and using topical steroid.   Patient has complex cardiac history followed by Dr. Sung Amabile. A. Fib, a. Flutter, CHF, hypertensive cardiomyopathy, PFO, pulmonary hypertension. Cardiology is treating carvedilol 6.25 mg - takes 1/2 tab AM and full tab PM due to hypotension.  He does weigh daily at home, 172 - 185 lb, will take metolazone 2.5 mg if 184+ Wt Readings from Last 5 Encounters:  03/26/20 187 lb 14.4 oz (85.2 kg)  03/14/20 185 lb 6.4 oz (84.1 kg)  03/13/20 182 lb 12.8 oz (82.9 kg)  02/27/20 187 lb (84.8 kg)  02/08/20 188 lb 3.2 oz (85.4 kg)   Lab Results  Component Value Date   CREATININE 1.92 (H) 03/13/2020   BUN 34 (H) 03/13/2020   NA 144 03/13/2020   K 3.2 (L) 03/13/2020   CL 102 03/13/2020   CO2 30 03/13/2020   Patient is on Coumadin for PAfib with goal of 2-2.5 related to vitrous hemorrhage, right eye. He has not taken any antibiotics or missed any doses.   He is currently taking 5 mg daily, previously 7.5 mg daily.  Patient's  last INR was:   Lab Results  Component Value Date   INR 1.1 02/27/2020   INR 3.4 (H) 02/06/2020   INR 1.3 (H) 01/01/2020    He has seen Dr. Irene Limbo for anemia,  He has had iron infusion that helped resolved dizziness.   He is also wearing compression stockings. He was unable to  tolerate oral iron supplement due to severe constipation.  Lab Results  Component Value Date   IRON 86 03/13/2020   TIBC 298 03/13/2020   FERRITIN 523 (H) 03/13/2020   He has CKD IIIb associated with htn, T2DM, follows with nephrology (Dr. Carolin Sicks), reports has following next week with labs Lab Results  Component Value Date   GFRNONAA 35 (L) 03/13/2020    Lab Results  Component Value Date   CREATININE 1.92 (H) 03/13/2020   CREATININE 1.74 (H) 02/27/2020   CREATININE 1.69 (H) 02/06/2020     CBC Latest Ref Rng & Units 03/13/2020 02/27/2020 02/08/2020  WBC 4.0 - 10.5 K/uL 7.0 8.0 8.4  Hemoglobin 13.0 - 17.0 g/dL 11.6(L) 10.3(L) 8.7(L)  Hematocrit 39.0 - 52.0 % 38.2(L) 33.9(L) 28.6(L)  Platelets 150 - 400 K/uL 213 281 256    Lab Results  Component Value Date   CALCIUM 8.9 03/13/2020     Past Medical History:  Diagnosis Date  . Adrenal adenoma   . Arthritis   . Atypical atrial flutter ([place]) 8/15, 10/15   a. DCCV 08/2013. b. s/p RFA 10/2013.  [name] Basal cell carcinoma   . CAD (coronary artery disease)    a. 04/2013 CABG x 2: LIMA to LAD, SVG to RI, EVH via R thigh.  . Chronic diastolic congestive heart failure ([place])   . CKD (chronic kidney disease), stage III ([place])   . Depression   . Diabetes mellitus type II   . Diverticulosis 2001  . DJD (degenerative joint disease)   . GERD (gastroesophageal reflux disease)   . Gout   . History of cardioversion    x3 (years uncertain)  . Hx of adenomatous colonic polyps   . Hyperlipidemia   . Hypertension   . Hypertensive cardiomyopathy ([place])   . Hypertensive retinopathy    OU  . Macular degeneration    OU  . Obstructive sleep apnea    compliant with CPAP  . 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 Persistent atrial fibrillation ([place])    a. s/p MAZE 04/2013 in setting of CABG. b. Amio stopped in 10/2013 after flutter  ablation.  [name] PFO (patent foramen ovale)    a. Small PFO by TEE 10/2013.  [name] Pleural effusion, left    a. s/p thoracentesis 05/2013.  [name] Respiratory failure ([place])    a. Hypoxia 10/2013 - required supp O2 as inpatient, did not require it at discharge.  . S/P Maze operation for atrial fibrillation    a. 04/2013: Complete bilateral atrial lesion set using cryothermy and bipolar radiofrequency ablation with clipping of LA appendage (@ time of CABG)     Allergies  Allergen Reactions  . Sunflower Oil Swelling and Other (See Comments)    Tongue and lip swelling Other reaction(s): Other Tongue and lip swelling  . Horse-Derived Products Other (See Comments)    Per allergy skin test UNSPECIFIED REACTION  Other reaction(s): Other Per allergy skin test UNSPECIFIED REACTION   . Immune Globulin   . Tetanus Toxoids Other (See Comments)    Per  allergy skin test  . Tetanus Toxoid     Other reaction(s): Other (See Comments) Rash(horse serum) Other reaction(s): Other Rash(horse serum)     Current Outpatient Medications (Endocrine & Metabolic):  .  insulin NPH-regular Human (70-30) 100 UNIT/ML injection, Inject 25-45 Units into the skin See admin instructions. 45 units in the morning and 30 units in the evening  Current Outpatient Medications (Cardiovascular):  .  atorvastatin (LIPITOR) 20 MG tablet, Take      1 tablet       3 x /week       for Cholesterol .  carvedilol (COREG) 6.25 MG tablet, Take 0.5 tablets (3.125 mg total) by mouth in the morning AND 1 tablet (6.25 mg total) every evening. .  metolazone (ZAROXOLYN) 2.5 MG tablet, TAKE 1 TABLET BY MOUTH  DAILY AS NEEDED FOR  SWELLING (Patient taking differently: Take 2.5 mg by mouth daily as needed (swelling).) .  torsemide (DEMADEX) 100 MG tablet, Take 150 mg by mouth daily.  Current Outpatient Medications (Respiratory):  .  azelastine (ASTELIN) 0.1 % nasal spray, Place 2 sprays into both nostrils 2 (two) times daily as needed for rhinitis or  allergies. .  diphenhydrAMINE (BENADRYL) 25 MG tablet, Take 25 mg by mouth at bedtime as needed for allergies. .  fluticasone (FLONASE) 50 MCG/ACT nasal spray, Place 1 spray into both nostrils daily as needed for allergies. [name]  loratadine (CLARITIN) 10 MG tablet, Take 10 mg by mouth daily as needed for allergies.   Current Outpatient Medications (Analgesics):  .  acetaminophen (TYLENOL) 500 MG tablet, Take 1,000 mg by mouth every 6 (six) hours as needed for moderate pain or headache.  .  allopurinol (ZYLOPRIM) 300 MG tablet, Take 300 mg by mouth daily. [name]  aspirin EC 81 MG tablet, Take 81 mg by mouth daily.  Current Outpatient Medications (Hematological):  .  warfarin (COUMADIN) 5 MG tablet, Take    1.5 tablets /day      or as directed  Current Outpatient Medications (Other):  [name]  ACCU-CHEK AVIVA PLUS test strip, CHECK BLOOD GLUCOSE 3 TIMES DAILY .  ALPRAZolam (XANAX) 1 MG tablet, Take 0.5 tablets (0.5 mg total) by mouth at bedtime as needed for sleep. [name]  Alum Hydroxide-Mag Carbonate (GAVISCON PO), Take 4 tablets by mouth daily as needed (acid reflux).  .  Ascorbic Acid (VITAMIN C) 1000 MG tablet, Take 1,000 mg by mouth daily. .  B Complex Vitamins (VITAMIN B COMPLEX PO), Take 1 tablet by mouth at bedtime.  .  bacitracin 500 UNIT/GM ointment, Apply 1 application topically daily as needed for wound care. .  Blood Glucose Monitoring Suppl (ACCU-CHEK AVIVA PLUS) w/Device KIT, Check blood sugar 1 time  daily .  Blood Glucose Monitoring Suppl DEVI, Test blood sugar up to three times a day or as directed. .  Cholecalciferol (VITAMIN D PO), Take 5,000 Units by mouth daily.  .  Multiple Vitamins-Minerals (PRESERVISION AREDS 2) CAPS, Take 1 capsule by mouth 2 (two) times daily.  .  polyvinyl alcohol (LIQUIFILM TEARS) 1.4 % ophthalmic solution, Place 1 drop into both eyes daily as needed for dry eyes. .  potassium chloride SA (KLOR-CON) 20 MEQ tablet, TAKE 2 TABLETS BY MOUTH 3  TIMES DAILY FOR  POTASSIUM .  sertraline (ZOLOFT) 100 MG tablet, TAKE 1 TABLET BY MOUTH  DAILY FOR MOOD .  zinc gluconate 50 MG tablet, Take 50 mg by mouth daily.  ROS: Review of Systems  Constitutional: Negative for chills, diaphoresis, fever, malaise/fatigue  and weight loss.  HENT: Positive for congestion (persistent R nare, foreign body sensation) and nosebleeds (R, recurrent since childhood). Negative for ear pain, hearing loss and tinnitus.   Eyes: Negative for blurred vision and double vision.  Respiratory: Positive for shortness of breath (exertional, consistent with baseline). Negative for cough, sputum production and wheezing.   Cardiovascular: Negative for chest pain, palpitations, leg swelling and PND.  Gastrointestinal: Negative for abdominal pain, blood in stool, constipation, diarrhea, heartburn, melena, nausea and vomiting.  Genitourinary: Negative for dysuria and urgency.  Skin: Negative for itching and rash.  Neurological: Negative for dizziness, sensory change, weakness and headaches.  Endo/Heme/Allergies: Bruises/bleeds easily.  Psychiatric/Behavioral: Negative for depression.    Physical Exam:  BP 132/74   Pulse 75   Temp (!) 97.3 F (36.3 C)   Wt 187 lb 14.4 oz (85.2 kg)   SpO2 95%   BMI 26.21 kg/m   General Appearance: Well nourished, in no apparent distress. Eyes: PERRLA, EOMs, conjunctiva no swelling or erythema Sinuses: No Frontal/maxillary tenderness ENT/Mouth: Ext aud canals clear, TMs without erythema, bulging. No erythema, swelling, or exudate on post pharynx.  Tonsils not swollen or erythematous. Hearing normal. R nare with dried blood on turbinates, poor visibility, no clear deviation or visible soft tissue mass/polyp. Neck: Supple, thyroid normal.  Respiratory: Respiratory effort normal, BS equal bilaterally without rales, rhonchi, wheezing or stridor.  Cardio: RRR with no MRGs, systolic murmur. Brisk peripheral pulses with trace bilateral edema.  Abdomen: Soft,  obese/mildly distended, + BS.  Non tender, no guarding, rebound, hernias, masses. Lymphatics: Non tender without lymphadenopathy.   Musculoskeletal: Full ROM, 5/5 strength, normal gait.  Skin: Warm, dry without rashes, lesions; he has fragile skin and numerous small ecchymoses to bilateral upper extremities. Left hand, laceration, well approximated, no exudate noted, mild erythema, blanchable, non-tender. Neuro: Cranial nerves intact. Normal muscle tone, no cerebellar symptoms. Sensation intact.  Psych: Awake and oriented X 3, normal affect, Insight and Judgment appropriate.   Izora Ribas, NP 1:07 PM [facility]

## 2020-03-26 ENCOUNTER — Encounter: Payer: Self-pay | Admitting: Adult Health

## 2020-03-26 ENCOUNTER — Other Ambulatory Visit: Payer: Self-pay

## 2020-03-26 ENCOUNTER — Ambulatory Visit (INDEPENDENT_AMBULATORY_CARE_PROVIDER_SITE_OTHER): Payer: Medicare Other | Admitting: Adult Health

## 2020-03-26 VITALS — BP 132/74 | HR 75 | Temp 97.3°F | Wt 187.9 lb

## 2020-03-26 DIAGNOSIS — Z7901 Long term (current) use of anticoagulants: Secondary | ICD-10-CM | POA: Diagnosis not present

## 2020-03-26 DIAGNOSIS — N2581 Secondary hyperparathyroidism of renal origin: Secondary | ICD-10-CM | POA: Diagnosis not present

## 2020-03-26 DIAGNOSIS — D509 Iron deficiency anemia, unspecified: Secondary | ICD-10-CM | POA: Diagnosis not present

## 2020-03-26 DIAGNOSIS — I5032 Chronic diastolic (congestive) heart failure: Secondary | ICD-10-CM

## 2020-03-26 DIAGNOSIS — I10 Essential (primary) hypertension: Secondary | ICD-10-CM | POA: Diagnosis not present

## 2020-03-26 DIAGNOSIS — R0981 Nasal congestion: Secondary | ICD-10-CM | POA: Diagnosis not present

## 2020-03-26 DIAGNOSIS — I480 Paroxysmal atrial fibrillation: Secondary | ICD-10-CM

## 2020-03-26 DIAGNOSIS — I272 Pulmonary hypertension, unspecified: Secondary | ICD-10-CM | POA: Diagnosis not present

## 2020-03-26 DIAGNOSIS — D692 Other nonthrombocytopenic purpura: Secondary | ICD-10-CM | POA: Diagnosis not present

## 2020-03-26 DIAGNOSIS — I4892 Unspecified atrial flutter: Secondary | ICD-10-CM | POA: Diagnosis not present

## 2020-03-26 DIAGNOSIS — R040 Epistaxis: Secondary | ICD-10-CM

## 2020-03-27 LAB — CBC WITH DIFFERENTIAL/PLATELET
Absolute Monocytes: 614 cells/uL (ref 200–950)
Basophils Absolute: 62 cells/uL (ref 0–200)
Basophils Relative: 0.7 %
Eosinophils Absolute: 62 cells/uL (ref 15–500)
Eosinophils Relative: 0.7 %
HCT: 37.7 % — ABNORMAL LOW (ref 38.5–50.0)
Hemoglobin: 11.9 g/dL — ABNORMAL LOW (ref 13.2–17.1)
Lymphs Abs: 570 cells/uL — ABNORMAL LOW (ref 850–3900)
MCH: 28.6 pg (ref 27.0–33.0)
MCHC: 31.6 g/dL — ABNORMAL LOW (ref 32.0–36.0)
MCV: 90.6 fL (ref 80.0–100.0)
MPV: 8.9 fL (ref 7.5–12.5)
Monocytes Relative: 6.9 %
Neutro Abs: 7592 cells/uL (ref 1500–7800)
Neutrophils Relative %: 85.3 %
Platelets: 175 10*3/uL (ref 140–400)
RBC: 4.16 10*6/uL — ABNORMAL LOW (ref 4.20–5.80)
RDW: 20.5 % — ABNORMAL HIGH (ref 11.0–15.0)
Total Lymphocyte: 6.4 %
WBC: 8.9 10*3/uL (ref 3.8–10.8)

## 2020-03-27 LAB — PROTIME-INR
INR: 1.4 — ABNORMAL HIGH
Prothrombin Time: 14.4 s — ABNORMAL HIGH (ref 9.0–11.5)

## 2020-03-28 ENCOUNTER — Telehealth: Payer: Self-pay | Admitting: Hematology

## 2020-03-28 NOTE — Telephone Encounter (Signed)
Changed provider appointment due to provider's PAL. Patient is aware of changes.

## 2020-03-29 DIAGNOSIS — N184 Chronic kidney disease, stage 4 (severe): Secondary | ICD-10-CM | POA: Diagnosis not present

## 2020-03-30 ENCOUNTER — Other Ambulatory Visit (HOSPITAL_COMMUNITY): Payer: Self-pay | Admitting: Internal Medicine

## 2020-04-01 DIAGNOSIS — N2581 Secondary hyperparathyroidism of renal origin: Secondary | ICD-10-CM | POA: Diagnosis not present

## 2020-04-01 DIAGNOSIS — I5032 Chronic diastolic (congestive) heart failure: Secondary | ICD-10-CM | POA: Diagnosis not present

## 2020-04-01 DIAGNOSIS — E876 Hypokalemia: Secondary | ICD-10-CM | POA: Diagnosis not present

## 2020-04-01 DIAGNOSIS — N184 Chronic kidney disease, stage 4 (severe): Secondary | ICD-10-CM | POA: Diagnosis not present

## 2020-04-01 DIAGNOSIS — I129 Hypertensive chronic kidney disease with stage 1 through stage 4 chronic kidney disease, or unspecified chronic kidney disease: Secondary | ICD-10-CM | POA: Diagnosis not present

## 2020-04-01 DIAGNOSIS — D631 Anemia in chronic kidney disease: Secondary | ICD-10-CM | POA: Diagnosis not present

## 2020-04-08 ENCOUNTER — Encounter (INDEPENDENT_AMBULATORY_CARE_PROVIDER_SITE_OTHER): Payer: Medicare Other | Admitting: Ophthalmology

## 2020-04-08 ENCOUNTER — Other Ambulatory Visit: Payer: Self-pay

## 2020-04-08 DIAGNOSIS — I10 Essential (primary) hypertension: Secondary | ICD-10-CM

## 2020-04-08 DIAGNOSIS — H353111 Nonexudative age-related macular degeneration, right eye, early dry stage: Secondary | ICD-10-CM | POA: Diagnosis not present

## 2020-04-08 DIAGNOSIS — H34832 Tributary (branch) retinal vein occlusion, left eye, with macular edema: Secondary | ICD-10-CM

## 2020-04-08 DIAGNOSIS — H35033 Hypertensive retinopathy, bilateral: Secondary | ICD-10-CM | POA: Diagnosis not present

## 2020-04-08 DIAGNOSIS — H353122 Nonexudative age-related macular degeneration, left eye, intermediate dry stage: Secondary | ICD-10-CM

## 2020-04-08 DIAGNOSIS — H43813 Vitreous degeneration, bilateral: Secondary | ICD-10-CM

## 2020-04-08 DIAGNOSIS — H338 Other retinal detachments: Secondary | ICD-10-CM

## 2020-04-20 ENCOUNTER — Other Ambulatory Visit (HOSPITAL_COMMUNITY): Payer: Self-pay | Admitting: Internal Medicine

## 2020-04-21 ENCOUNTER — Other Ambulatory Visit: Payer: Self-pay | Admitting: Internal Medicine

## 2020-04-29 ENCOUNTER — Encounter: Payer: Self-pay | Admitting: Internal Medicine

## 2020-04-29 DIAGNOSIS — D649 Anemia, unspecified: Secondary | ICD-10-CM | POA: Insufficient documentation

## 2020-04-29 DIAGNOSIS — I482 Chronic atrial fibrillation, unspecified: Secondary | ICD-10-CM | POA: Insufficient documentation

## 2020-04-29 NOTE — Patient Instructions (Signed)
Due to recent changes in healthcare laws, you may see the results of your imaging and laboratory studies on MyChart before your provider has had a chance to review them.  We understand that in some cases there may be results that are confusing or concerning to you. Not all laboratory results come back in the same time frame and the provider may be waiting for multiple results in order to interpret others.  Please give [place] 48 hours in order for your provider to thoroughly review all the results before contacting the office for clarification of your results.    ++++++++++++++++++++++++++++++++++  Warfarin Coagulopathy  Warfarin coagulopathy refers to bleeding that may occur as a complication of the medicine warfarin. Warfarin is a blood thinner (anticoagulant). Anticoagulants prevent dangerous blood clots. Bleeding is the most common and most serious complication of warfarin. While taking warfarin, you will need to have blood tests (prothrombin tests, or PT tests) regularly to measure your blood clotting time. The PT test results will be reported as the International Normalized Ratio (INR). The INR tells your health care provider whether your dosage of warfarin needs to be changed. The longer it takes your blood to clot, the higher the INR. Your risk of warfarin coagulopathy increases as your INR increases. What are the causes? This condition may be caused by:  Taking too much warfarin (overdose).  Underlying medical conditions.  Changes to your diet.  Interactions with medicines, supplements, or alcohol. What are the signs or symptoms? Warfarin coagulopathy may cause bleeding from any tissue or organ. Symptoms may include:  Bleeding from the gums.  A nosebleed that is not easily stopped.  Blood in stool. This may look like bright red, dark, or black, tarry stools.  Blood in urine. This may look like pink, red, or brown urine.  Unusual bruising or bruising easily.  A cut that does not  stop bleeding within 10 minutes.  Coughing up blood.  Vomiting blood.  Feeling nauseous for longer than 1 day.  Broken blood vessels in the eye (subconjunctival hemorrhage). This may look like a bright red or dark red patch on the white part of the eye.  Abdominal or back pain with or without bruising.  Sudden, severe headache.  Sudden weakness or numbness of the face, arm, or leg, especially on one side of the body.  Sudden confusion.  Difficulty speaking (aphasia) or understanding speech.  Sudden trouble seeing out of one or both eyes.  Unexpected difficulty walking.  Dizziness.  Loss of balance or coordination.  Unusual vaginal bleeding.  Swelling or pain at an injection site.  Skin scarring due to tissue death (necrosis) of fatty tissue. This may cause pain in the waist, thighs, or buttocks. This is more common among women. How is this diagnosed? This condition is diagnosed after your health care provider places you on warfarin and then finds out how it affects your blood's ability to clot. Prothrombin time (PT) clotting tests are used to monitor your clotting factor. These tests also help your health care provider to find the warfarin dose that is best for you. How is this treated? This condition is treated with vitamin K. Vitamin K helps the blood to clot. You may receive vitamin K every 12 hours, or as needed. You may also receive donated plasma (transfusions of fresh frozen plasma). Plasma is the liquid part of blood, and contains substances that help the blood clot. Follow these instructions at home: Medicines  Take warfarin exactly as told by your health  care provider. This ensures that you avoid bleeding or clots that could result in serious injury, pain, or disability.  Take your medicine at the same time every day. If you forget to take your dose of warfarin, take it as soon as you remember on that day. If you do not remember to take it on that day, do not take  an extra dose the next day.  Contact your health care provider if you miss a dose or take an extra dose. Do not change your dosage on your own to make up for missed or extra doses.  Talk with your health care provider or your pharmacist before starting or stopping any new medicines. Many prescription and over-the-counter medicines can interfere with warfarin. This includes over-the-counter vitamins, dietary supplements, herbal medicines, and pain medicines. Your warfarin dosage may need to be adjusted. Eating and drinking  It is important to maintain a normal, balanced diet while taking warfarin. Avoid major changes in your diet. If you are planning to change your diet, talk with your health care provider before making changes.  Your health care provider may recommend that you work with a diet and nutrition specialist (dietitian).  Vitamin K makes warfarin less effective. It is found in many foods. Eat a consistent amount of foods that contain vitamin K. For example, you may decide to eat 2 vitamin K-containing foods each day. Your warfarin dose is set according to the amount of vitamin K in your blood.  Eat a consistent amount of foods that contain vitamin K. Vitamin K makes warfarin less effective, so eating the same amount each day enables your health care provider to set the correct dose of warfarin. You may decide to eat 2 vitamin K-containing foods each day. Preventing bleeding and injury  Some common over-the-counter medicines and supplements may increase the risk of bleeding while taking warfarin. They include: ? Acetaminophen. ? Aspirin. ? NSAIDs, such as ibuprofen or naproxen. ? Vitamin E.  Avoid situations that cause bleeding. You may bleed more easily while taking warfarin. To limit bleeding, take the following actions: ? Use a softer toothbrush. ? Floss with waxed floss, not unwaxed floss. ? Shave with an electric razor, not with a blade. ? Limit your use of sharp  objects. ? Avoid potentially harmful activities, such as contact sports. General instructions  Wear or carry identification that says that you are taking warfarin.  Make sure that all health care providers, including your dentist, know that you are taking warfarin.  If you need surgery, tell your health care provider that you are taking warfarin. You may have to stop taking warfarin before your surgery.  If you plan to breastfeed or become pregnant while taking warfarin, talk with your health care provider.  Avoid alcohol, tobacco, and drugs. ? If your health care provider approves, limit alcohol intake to no more than 1 drink a day for non-pregnant women and 2 drinks a day for men. One drink equals 12 oz. of beer, 5 oz. of wine, or 1 oz. of hard liquor. ? If you change the amount of nicotine, tobacco, or alcohol you use, tell your health care provider.  Keep all follow-up visits and lab visits as told by your health care provider. This is very important because warfarin is a medicine that needs to be closely monitored. Contact a health care provider if:  You miss a dose.  You take an extra dose.  You plan to have any kind of surgery or procedure.  You  are unable to take your medicine due to nausea, vomiting, or diarrhea.  You have any major changes in your diet, or you plan to make major changes in your diet.  You start or stop any over-the-counter medicine, prescription medicine, or dietary supplement.  You become pregnant, plan to become pregnant, or think you may be pregnant.  You have menstrual periods that are heavier than usual, or unusual vaginal bleeding.  You have unusual bruising.  You lose your appetite.  You have a fever.  You have diarrhea that lasts for more than 24 hours. Get help right away if:  You develop symptoms of an allergic reaction, such as: ? Swelling of the lips, face, tongue, mouth, or throat. ? Rash. ? Itching. ? Itchy, red, swollen areas  of skin (hives). ? Trouble breathing. ? Chest tightness.  You have any symptoms of stroke. BEFAST is an easy way to remember the main warning signs of stroke: ? B - Balance. Signs are dizziness, sudden trouble walking, or loss of balance. ? E - Eye. Signs are trouble seeing or a sudden change in vision. ? F - Face. Signs are sudden weakness or numbness of the face, or the face or eyelid drooping on one side. ? A - Arm. Signs are weakness or numbness in an arm. This happens suddenly and usually on one side of the body. ? S - Speech. Signs are trouble speaking, slurred speech, or trouble understanding speech. ? T - Time. Time to call emergency services. Write down the time your symptoms started.  You have other signs of stroke, such as: ? A sudden, severe headache with no known cause. ? Nausea or vomiting. ? Seizure.  You have signs or symptoms of a blood clot, such as: ? Pain or swelling in your leg or arm. ? Skin that is red or warm to the touch on your arm or leg. ? Shortness of breath or difficulty breathing. ? Chest pain. ? Unexplained fever.  You have: ? A fall or have an accident, especially if you hit your head. ? Blood in your urine. Your urine may look reddish, pinkish, or tea-colored. ? Blood in your stool. Your stool may be black or bright red. ? Bleeding that does not stop after applying pressure to the area for 30 minutes. ? Severe pain in your joints or back. ? Purple or blue toes. ? Skin ulcers that do not go away.  You vomit blood or cough up blood. The blood may be bright red, or it may look like coffee grounds. These symptoms may represent a serious problem that is an emergency. Do not wait to see if the symptoms will go away. Get medical help right away. Call your local emergency services (911 in the [place] Do not drive yourself to the hospital. Summary  Warfarin needs to be closely monitored with blood tests. It is very important to keep all lab visits and  follow-up visits with your health care provider. Make sure you know your target INR range and your warfarin dosage.  Monitor how much vitamin K you eat every day. Try to eat the same amount every day.  Wear or carry identification that says that you are taking warfarin.  Take warfarin at the same time every day. Call your health care provider if you miss a dose or if you take an extra dose. Do not change the dosage of warfarin on your own.  Know the signs and symptoms of blood clots, bleeding, and stroke. Know  when to get emergency medical help.  ++++++++++++++++++++++++++++++++++  Vit D  & Vit C 1,000 mg   are recommended to help protect  against the Covid-19 and other Corona viruses.    Also it's recommended  to take  Zinc 50 mg  to help  protect against the Covid-19   and best place to get  is also on [email or web address]  and don't pay more than 6-8 cents /pill !   ===================================== Coronavirus (COVID-19) Are you at risk?  Are you at risk for the Coronavirus (COVID-19)?  To be considered HIGH RISK for Coronavirus (COVID-19), you have to meet the following criteria:  . Traveled to [place], [place], [place], [place] or [place]; or in the [place] to [place], [place], [place]  . or [place]; and have fever, cough, and shortness of breath within the last 2 weeks of travel OR . Been in close contact with a person diagnosed with COVID-19 within the last 2 weeks and have  . fever, cough,and shortness of breath .  . IF YOU DO NOT MEET THESE CRITERIA, YOU ARE CONSIDERED LOW RISK FOR COVID-19.  What to do if you are HIGH RISK for COVID-19?  [name] If you are having a medical emergency, call 911. . Seek medical care right away. Before you go to a doctor's office, urgent care or emergency department, .  call ahead and tell them about your recent travel, contact with someone diagnosed with COVID-19  .  and your symptoms.  . You should receive instructions from your  physician's office regarding next steps of care.  . When you arrive at healthcare provider, tell the healthcare staff immediately you have returned from  . visiting [place], [place], [place], [place] or [place]; or traveled in the [place] to [place], [place],  . [place] or [place] in the last two weeks or you have been in close contact with a person diagnosed with  . COVID-19 in the last 2 weeks.   . Tell the health care staff about your symptoms: fever, cough and shortness of breath. . After you have been seen by a medical provider, you will be either: o Tested for (COVID-19) and discharged home on quarantine except to seek medical care if  o symptoms worsen, and asked to  - Stay home and avoid contact with others until you get your results (4-5 days)  - Avoid travel on public transportation if possible (such as bus, train, or airplane) or o Sent to the Emergency Department by EMS for evaluation, COVID-19 testing  and  o possible admission depending on your condition and test results.  What to do if you are LOW RISK for COVID-19?  Reduce your risk of any infection by using the same precautions used for avoiding the common cold or flu:  [name] Wash your hands often with soap and warm water for at least 20 seconds.  If soap and water are not readily available,  . use an alcohol-based hand sanitizer with at least 60% alcohol.  . If coughing or sneezing, cover your mouth and nose by coughing or sneezing into the elbow areas of your shirt or coat, .  into a tissue or into your sleeve (not your hands). . Avoid shaking hands with others and consider head nods or verbal greetings only. . Avoid touching your eyes, nose, or mouth with unwashed hands.  . Avoid close contact with people who are sick. . Avoid places or events with large numbers of  people in one location, like concerts or sporting events. . Carefully consider travel plans you have or are making. . If you are planning any travel  outside or inside the [place], visit the CDC's Travelers' Health webpage for the latest health notices. . If you have some symptoms but not all symptoms, continue to monitor at home and seek medical attention  . if your symptoms worsen. . If you are having a medical emergency, call 911.   ++++++++++++++++++++++++++++++++ Recommend Adult Low Dose Aspirin or  coated  Aspirin 81 mg daily  To reduce risk of Colon Cancer 40 %,  Skin Cancer 26 % ,  Melanoma 46%  and  Pancreatic cancer 60% ++++++++++++++++++++++++++++++++ Vitamin D goal  is between 70-100.  Please make sure that you are taking your Vitamin D as directed.  It is very important as a natural anti-inflammatory  helping hair, skin, and nails, as well as reducing stroke and heart attack risk.  It helps your bones and helps with mood. It also decreases numerous cancer risks so please take it as directed.  Low Vit D is associated with a 200-300% higher risk for CANCER  and 200-300% higher risk for HEART   ATTACK  &  STROKE.   .....................................[name] It is also associated with higher death rate at younger ages,  autoimmune diseases like Rheumatoid arthritis, Lupus, Multiple Sclerosis.    Also many other serious conditions, like depression, Alzheimer's Dementia, infertility, muscle aches, fatigue, fibromyalgia - just to name a few. ++++++++++++++++++++ Recommend the book "The END of DIETING" by Dr Excell Seltzer  & the book "The END of DIABETES " by Dr Excell Seltzer At [email or web address] - get book & Audio CD's    Being diabetic has a  300% increased risk for heart attack, stroke, cancer, and alzheimer- type vascular dementia. It is very important that you work harder with diet by avoiding all foods that are white. Avoid white rice (brown & wild rice is OK), white potatoes (sweetpotatoes in moderation is OK), White bread or wheat bread or anything made out of white flour like bagels, donuts, rolls, buns, biscuits, cakes, pastries,  cookies, pizza crust, and pasta (made from white flour & egg whites) - vegetarian pasta or spinach or wheat pasta is OK. Multigrain breads like Arnold's or Pepper[employer], or multigrain sandwich thins or flatbreads.  Diet, exercise and weight loss can reverse and cure diabetes in the early stages.  Diet, exercise and weight loss is very important in the control and prevention of complications of diabetes which affects every system in your body, ie. Brain - dementia/stroke, eyes - glaucoma/blindness, heart - heart attack/heart failure, kidneys - dialysis, stomach - gastric paralysis, intestines - malabsorption, nerves - severe painful neuritis, circulation - gangrene & loss of a leg(s), and finally cancer and Alzheimers.    I recommend avoid fried & greasy foods,  sweets/candy, white rice (brown or wild rice or Quinoa is OK), white potatoes (sweet potatoes are OK) - anything made from white flour - bagels, doughnuts, rolls, buns, biscuits,white and wheat breads, pizza crust and traditional pasta made of white flour & egg white(vegetarian pasta or spinach or wheat pasta is OK).  Multi-grain bread is OK - like multi-grain flat bread or sandwich thins. Avoid alcohol in excess. Exercise is also important.    Eat all the vegetables you want - avoid meat, especially red meat and dairy - especially cheese.  Cheese is the most concentrated form of trans-fats which is the worst thing to clog  up our arteries. Veggie cheese is OK which can be found in the fresh produce section at [employer] or [employer] or [employer]  +++++++++++++++++++++ DASH Eating Plan  DASH stands for "Dietary Approaches to Stop Hypertension."   The DASH eating plan is a healthy eating plan that has been shown to reduce high blood pressure (hypertension). Additional health benefits may include reducing the risk of type 2 diabetes mellitus, heart disease, and stroke. The DASH eating plan may also help with weight loss. WHAT DO I NEED TO  KNOW ABOUT THE DASH EATING PLAN? For the DASH eating plan, you will follow these general guidelines:  Choose foods with a percent daily value for sodium of less than 5% (as listed on the food label).  Use salt-free seasonings or herbs instead of table salt or sea salt.  Check with your health care provider or pharmacist before using salt substitutes.  Eat lower-sodium products, often labeled as "lower sodium" or "no salt added."  Eat fresh foods.  Eat more vegetables, fruits, and low-fat dairy products.  Choose whole grains. Look for the word "whole" as the first word in the ingredient list.  Choose fish   Limit sweets, desserts, sugars, and sugary drinks.  Choose heart-healthy fats.  Eat veggie cheese   Eat more home-cooked food and less restaurant, buffet, and fast food.  Limit fried foods.  Cook foods using methods other than frying.  Limit canned vegetables. If you do use them, rinse them well to decrease the sodium.  When eating at a restaurant, ask that your food be prepared with less salt, or no salt if possible.                      WHAT FOODS CAN I EAT? Read Dr Fara Olden Fuhrman's books on The End of Dieting & The End of Diabetes  Grains Whole grain or whole wheat bread. Brown rice. Whole grain or whole wheat pasta. Quinoa, bulgur, and whole grain cereals. Low-sodium cereals. Corn or whole wheat flour tortillas. Whole grain cornbread. Whole grain crackers. Low-sodium crackers.  Vegetables Fresh or frozen vegetables (raw, steamed, roasted, or grilled). Low-sodium or reduced-sodium tomato and vegetable juices. Low-sodium or reduced-sodium tomato sauce and paste. Low-sodium or reduced-sodium canned vegetables.   Fruits All fresh, canned (in natural juice), or frozen fruits.  Protein Products  All fish and seafood.  Dried beans, peas, or lentils. Unsalted nuts and seeds. Unsalted canned beans.  Dairy Low-fat dairy products, such as skim or 1% milk, 2% or reduced-fat  cheeses, low-fat ricotta or cottage cheese, or plain low-fat yogurt. Low-sodium or reduced-sodium cheeses.  Fats and Oils Tub margarines without trans fats. Light or reduced-fat mayonnaise and salad dressings (reduced sodium). Avocado. Safflower, olive, or canola oils. Natural peanut or almond butter.  Other Unsalted popcorn and pretzels. The items listed above may not be a complete list of recommended foods or beverages. Contact your dietitian for more options.  +++++++++++++++  WHAT FOODS ARE NOT RECOMMENDED? Grains/ White flour or wheat flour White bread. White pasta. White rice. Refined cornbread. Bagels and croissants. Crackers that contain trans fat.  Vegetables  Creamed or fried vegetables. Vegetables in a . Regular canned vegetables. Regular canned tomato sauce and paste. Regular tomato and vegetable juices.  Fruits Dried fruits. Canned fruit in light or heavy syrup. Fruit juice.  Meat and Other Protein Products Meat in general - RED meat & White meat.  Fatty cuts of meat. Ribs, chicken wings, all processed meats as  bacon, sausage, bologna, salami, fatback, hot dogs, bratwurst and packaged luncheon meats.  Dairy Whole or 2% milk, cream, half-and-half, and cream cheese. Whole-fat or sweetened yogurt. Full-fat cheeses or blue cheese. Non-dairy creamers and whipped toppings. Processed cheese, cheese spreads, or cheese curds.  Condiments Onion and garlic salt, seasoned salt, table salt, and sea salt. Canned and packaged gravies. Worcestershire sauce. Tartar sauce. Barbecue sauce. Teriyaki sauce. Soy sauce, including reduced sodium. Steak sauce. Fish sauce. Oyster sauce. Cocktail sauce. Horseradish. Ketchup and mustard. Meat flavorings and tenderizers. Bouillon cubes. Hot sauce. Tabasco sauce. Marinades. Taco seasonings. Relishes.  Fats and Oils Butter, stick margarine, lard, shortening and bacon fat. Coconut, palm kernel, or palm oils. Regular salad dressings.  Pickles and  olives. Salted popcorn and pretzels.  The items listed above may not be a complete list of foods and beverages to avoid.

## 2020-04-29 NOTE — Progress Notes (Signed)
Future Appointments  Date Time Provider [place]  04/30/2020  4:00 PM [name], MD [facility] None  05/06/2020 12:30 PM Hayden Pedro, MD [facility] None  06/07/2020 12:30 PM Hayden Pedro, MD [facility] None  06/11/2020 10:30 AM [facility] LAB [facility] None  06/11/2020 11:00 AM Dede Query T, PA-C [facility] None  06/11/2020 11:45 AM [facility] INFUSION [facility] None  10/23/2020 11:00 AM [name], MD [facility] None  02/26/2021  3:30 PM Garnet Sierras, NP [facility] None    History of Present Illness:       This very nice 80 yo MWM is  followed for multiple co-morbities including HTN, ASHD/CABG, chAfib, Pulm. HTN, COPD,HLD, T2_IDDM, COPDand Vitamin D Deficiency.       Patient is s/p CABG w MAZE procedure and has hx/ RF Ablations for Afib & Flutter is followed on Coumadin for chronic Afib.  Last Protime/ INR was sl low at 1.4 x and Coumadin was increased from 5 mg /daily to 7.5 mg  3 x /wk on MWF & 5 mg 4 x /wk on TThSS.       Patient is treated for HTN & BP has been controlled at home. Today's BP is at goal -  118/74. Patient has had no complaints of any cardiac type chest pain, palpitations, orthopnea / PND, dizziness, claudication or dependent edema. Patient is c/o decreasing exercise tolerance over the last month having to stop & rest more frequently.        Hyperlipidemia is controlled with diet & Atorvastatin . Patient denies myalgias or other med SE's. 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  Patient is followed by Dr Zigmund Daniel for Diabetic Retinopathy. Patient's Diabetes is not well controlled on bid dosing of Novolin 70/30.  Patient denies symptoms of reactive hypoglycemia, diabetic  polys, paresthesias or visual blurring.  Last A1c was not at goal.  Lab Results  Component Value Date   HGBA1C 8.2 (H) 02/06/2020            Further, the patient also has history of Vitamin D Deficiency ("39" /2008) and supplements vitamin D without any suspected side-effects. Last vitamin D was near goal:  Lab Results  Component Value Date   VD25OH 56 02/06/2020    Current Outpatient Medications on File Prior to Visit  Medication Sig  . acetaminophen  500 MG tablet Take 1,000 mg every 6  hrs as needed f  . allopurinol  300 MG tablet Take daily.  [name] ALPRAZolam  1 MG tablet Take 0.5 tablets  at bedtime as needed for sleep.  [name] GAVISCON  Take 4 tablets by mouth daily as needed   . VITAMIN C 1000 MG tablet Take  daily.  [name] aspirin EC 81 MG tablet Take daily.  [name] atorvastatin  20 MG tablet Take  1 tablet  3 x /week  for Cholesterol  . B Complex Vitamins  Take 1 tablet  at bedtime.   . bacitracin 500 UNIT/GM ointment Apply 1 application topically daily as needed for wound care.  . carvedilol  6.25 MG tablet Take 0.5 tablets  in the morning AND 1 tablet  every evening.  [name] VITAMIN D  Take 5,000 Units  daily.   . diphenhydrAMINE  25 MG tablet Take 25 mg  at bedtime as needed for allergies.  [name] FLONASE nasal spray Place 1 spray into both nostrils daily as needed for allergies.  . Novolin  (70-30)  45 units in the morning and 30 units in the evening  . loratadine 10 MG tablet Take  daily as needed for allergies.   . metolazone 2.5 MG tablet Take 1 tablet daily as needed   . PRESERVISION AREDS 2 Take 1 capsule  2  times daily.   [name] LIQUIFILM TEARS 1.4 % ophth soln Place 1 drop into both eyes daily as needed for dry eyes.  . potassium chloride 20 MEQ tablet TAKE 2 TABLETS  3  TIMES DAILY FOR POTASSIUM  . sertraline  100 MG tablet TAKE 1 TABLET  DAILY FOR MOOD  . torsemide  100 MG tablet TAKE 1 TABLET IN  THE MORNING AND 1/2 TABLET  IN THE EVENING IF NEEDED  FOR WEIGHT GAIN  . warfarin (COUMADIN)  5 MG tablet Take    1.5 tablets x 3 days and  1 tab x 4 days /week   . zinc  50 MG tablet Take  daily.     Allergies  Allergen Reactions  . Sunflower Oil Tongue and lip swelling    Tongue and lip swelling  . Horse-Derived Products Per allergy skin test    Per allergy skin test  . Immune Globulin   . Tetanus Toxoids Per allergy skin test  . Tetanus Toxoid Rash(horse serum)    PMHx:   Past Medical History:  Diagnosis Date  . Adrenal adenoma   . Arthritis   . Atypical atrial flutter ([place]) 8/15, 10/15   a. DCCV 08/2013. b. s/p RFA 10/2013.  [name] Basal cell carcinoma   . CAD (coronary artery disease)    a. 04/2013 CABG x 2: LIMA to LAD, SVG to RI, EVH via R thigh.  . Chronic diastolic congestive heart failure ([place])   . CKD (chronic kidney disease), stage III ([place])   . Depression   . Diabetes mellitus type II   . Diverticulosis 2001  . DJD (degenerative joint disease)   . GERD (gastroesophageal reflux disease)   . Gout   . History of cardioversion    x3 (years uncertain)  . Hx of adenomatous colonic polyps   . Hyperlipidemia   . Hypertension   . Hypertensive cardiomyopathy ([place])   . Hypertensive retinopathy    OU  . Macular degeneration    OU  . Obstructive sleep apnea    compliant with CPAP  . 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 Persistent atrial fibrillation ([place])    a. s/p MAZE 04/2013 in setting of CABG. b. Amio stopped in 10/2013 after flutter ablation.  [name] PFO (patent foramen ovale)    a. Small PFO by TEE 10/2013.  [name] Pleural effusion, left    a. s/p thoracentesis 05/2013.  [name] Respiratory failure ([place])    a. Hypoxia 10/2013 - required supp O2 as inpatient, did not require it at discharge.  . S/P Maze operation for atrial fibrillation    a. 04/2013: Complete bilateral atrial lesion set using cryothermy and bipolar radiofrequency ablation with clipping of LA  appendage (@ time of CABG)    Immunization History  Administered Date(s) Administered  . DT (Pediatric) 08/05/2015  . Influenza Split 10/25/2012  .  Influenza, High Dose Seasonal PF 09/26/2013, 09/27/2014, 10/03/2015, 10/05/2016, 11/11/2017, 10/10/2018, 10/11/2019  . Influenza-Unspecified 10/05/2016, 09/22/2017  . PFIZER(Purple Top)SARS-COV-2 Vaccination 01/26/2019, 02/15/2019  . Pneumococcal Conjugate-13 01/30/2014  . Pneumococcal Polysaccharide-23 10/20/2011  . Td 01/06/2000    Past Surgical History:  Procedure Laterality Date  . APPENDECTOMY  03/05/2017   laproscopic  . ATRIAL FIBRILLATION ABLATION N/A 10/26/2013   Procedure: ATRIAL FIBRILLATION ABLATION;  Surgeon: Coralyn Mark, MD;  Location: [place] CATH LAB;  Service: Cardiovascular;  Laterality: N/A;  . BASAL CELL CARCINOMA EXCISION     x3 on face  . CARDIAC CATHETERIZATION     myocardial bridge but no cad  . CARDIOVERSION N/A 08/23/2013   Procedure: CARDIOVERSION;  Surgeon: Sanda Klein, MD;  Location: [place];  Service: Cardiovascular;  Laterality: N/A;  . CARPOMETACARPEL SUSPENSION PLASTY Left 02/14/2014   Procedure: CARPOMETACARPEL ([place]) SUSPENSIONPLASTY THUMB  WITH  ABDUCTOR POLLICIS LONGUS TRANSFER AND STENOSING TENOSYNOVITIS RELEASE LEFT WRIST;  Surgeon: Charlotte Crumb, MD;  Location: [place];  Service: Orthopedics;  Laterality: Left;  . CATARACT EXTRACTION Bilateral   . COLONOSCOPY WITH PROPOFOL N/A 01/26/2017   Procedure: COLONOSCOPY WITH PROPOFOL;  Surgeon: Doran Stabler, MD;  Location: [facility];  Service: Gastroenterology;  Laterality: N/A;  . CORONARY ARTERY BYPASS GRAFT N/A 04/05/2013   Procedure: CORONARY ARTERY BYPASS GRAFTING (CABG) TIMES TWO USING LEFT INTERNAL MAMMARY ARTERY AND RIGHT SAPHENOUS LEG VEIN HARVESTED ENDOSCOPICALLY;  Surgeon: Rexene Alberts, MD;  Location: [place];  Service: Open Heart Surgery;  Laterality: N/A;  . ESOPHAGOGASTRODUODENOSCOPY (EGD) WITH PROPOFOL N/A  01/26/2017   Procedure: ESOPHAGOGASTRODUODENOSCOPY (EGD) WITH PROPOFOL;  Surgeon: Doran Stabler, MD;  Location: [facility];  Service: Gastroenterology;  Laterality: N/A;  . EYE SURGERY Bilateral    Cat Sx  . EYE SURGERY Right    RD repair - SB  . GREAT TOE ARTHRODESIS, INTERPHALANGEAL JOINT     Right foot  . INTRAOPERATIVE TRANSESOPHAGEAL ECHOCARDIOGRAM N/A 04/05/2013   Procedure: INTRAOPERATIVE TRANSESOPHAGEAL ECHOCARDIOGRAM;  Surgeon: Rexene Alberts, MD;  Location: [place];  Service: Open Heart Surgery;  Laterality: N/A;  . LAPAROSCOPIC APPENDECTOMY N/A 03/05/2017   Procedure: APPENDECTOMY LAPAROSCOPIC;  Surgeon: Judeth Horn, MD;  Location: [place];  Service: General;  Laterality: N/A;  . LEFT HEART CATHETERIZATION WITH CORONARY ANGIOGRAM N/A 03/07/2013   Procedure: LEFT HEART CATHETERIZATION WITH CORONARY ANGIOGRAM;  Surgeon: Burnell Blanks, MD;  Location: [facility] CATH LAB;  Service: Cardiovascular;  Laterality: N/A;  . MAZE N/A 04/05/2013   Procedure: MAZE;  Surgeon: Rexene Alberts, MD;  Location: [place];  Service: Open Heart Surgery;  Laterality: N/A;  . Polinydal cyst     Removed  . POLYPECTOMY    . Retina repair-right    . RETINAL DETACHMENT SURGERY Right    RD Repair - SB  . RIGHT HEART CATH N/A 07/06/2019   Procedure: RIGHT HEART CATH;  Surgeon: Jolaine Artist, MD;  Location: [place] CV LAB;  Service: Cardiovascular;  Laterality: N/A;  . RIGHT HEART CATHETERIZATION N/A 05/03/2014   Procedure: RIGHT HEART CATH;  Surgeon: Jolaine Artist, MD;  Location: [facility] C[facility] LAB;  Service: Cardiovascular;  Laterality: N/A;  . TEE WITHOUT CARDIOVERSION N/A 08/23/2013   Procedure: TRANSESOPHAGEAL ECHOCARDIOGRAM (TEE);  Surgeon: Sanda Klein, MD;  Location: [facility] ENDOSCOPY;  Service: Cardiovascular;  Laterality: N/A;  . TEE WITHOUT CARDIOVERSION N/A 10/26/2013   Procedure: TRANSESOPHAGEAL ECHOCARDIOGRAM (TEE);  Surgeon: Sueanne Margarita, MD;  Location: [place];  Service: Cardiovascular;  Laterality: N/A;  . TEE WITHOUT CARDIOVERSION N/A 06/05/2014   Procedure: TRANSESOPHAGEAL ECHOCARDIOGRAM (TEE);  Surgeon: Thayer Headings, MD;  Location: [facility] ENDOSCOPY;  Service: Cardiovascular;  Laterality: N/A;  . TRAPEZIUM RESECTION      FHx:    Reviewed / unchanged  SHx:    Reviewed / unchanged   Systems Review:  Constitutional: Denies fever, chills, wt changes, headaches, insomnia, fatigue, night sweats, change in appetite. Eyes: Denies redness, blurred vision, diplopia, discharge, itchy, watery eyes.  ENT: Denies discharge, congestion, post nasal drip, epistaxis, sore throat, earache, hearing loss, dental pain, tinnitus, vertigo, sinus pain, snoring.  CV: Denies chest pain, palpitations, irregular heartbeat, syncope, dyspnea, diaphoresis, orthopnea, PND, claudication or edema. Respiratory: denies cough, dyspnea, DOE, pleurisy, hoarseness, laryngitis, wheezing.  Gastrointestinal: Denies dysphagia, odynophagia, heartburn, reflux, water brash, abdominal pain or cramps, nausea, vomiting, bloating, diarrhea, constipation, hematemesis, melena, hematochezia  or hemorrhoids. Genitourinary: Denies dysuria, frequency, urgency, nocturia, hesitancy, discharge, hematuria or flank pain. Musculoskeletal: Denies arthralgias, myalgias, stiffness, jt. swelling, pain, limping or strain/sprain.  Skin: Denies pruritus, rash, hives, warts, acne, eczema or change in skin lesion(s). Neuro: No weakness, tremor, incoordination, spasms, paresthesia or pain. Psychiatric: Denies confusion, memory loss or sensory loss. Endo: Denies change in weight, skin or hair change.  Heme/Lymph: No excessive bleeding, bruising or enlarged lymph nodes.  Physical Exam  BP 118/74   Pulse (!) 57   Temp (!) 97.5 F (36.4 C)   Resp 18   Ht 5\' 11"  (1.803 m)   Wt 189 lb 9.6 oz (86 kg)   SpO2 93%   BMI 26.44 kg/m   Appears  well nourished, well groomed  and in no distress.  Eyes: PERRLA, EOMs, conjunctiva no swelling or  erythema. Sinuses: No frontal/maxillary tenderness ENT/Mouth: EAC's clear, TM's nl w/o erythema, bulging. Nares clear w/o erythema, swelling, exudates. Oropharynx clear without erythema or exudates. Oral hygiene is good. Tongue normal, non obstructing. Hearing intact.  Neck: Supple. Thyroid not palpable. Car 2+/2+ without bruits, nodes or JVD. Chest: Respirations nl with BS clear & equal w/o rales, rhonchi, wheezing or stridor.  Cor: Heart sounds normal w/ regular rate and rhythm without sig. murmurs, gallops, clicks or rubs. Peripheral pulses normal and equal  without edema.  Abdomen: Soft & bowel sounds normal. Non-tender w/o guarding, rebound, hernias, masses or organomegaly.  Lymphatics: Unremarkable.  Musculoskeletal: Full ROM all peripheral extremities, joint stability, 5/5 strength and normal gait.  Skin: Warm, dry without exposed rashes, lesions or ecchymosis apparent.  Neuro: Cranial nerves intact, reflexes equal bilaterally. Sensory-motor testing grossly intact. Tendon reflexes grossly intact.  Pysch: Alert & oriented x 3.  Insight and judgement nl & appropriate. No ideations.  Assessment and Plan:  1. Essential hypertension  - Continue medication, monitor blood pressure at home.  - Continue DASH diet.  Reminder to go to the ER if any CP,  SOB, nausea, dizziness, severe HA, changes vision/speech.  - CBC with Differential/Platelet - COMPLETE METABOLIC PANEL WITH GFR  2. Hyperlipidemia associated with type 2 diabetes mellitus ([place])  - Continue diet/meds, exercise,& lifestyle modifications.  - Continue monitor periodic cholesterol/liver & renal functions    3. Type 2 diabetes mellitus with stage 4 chronic kidney disease, with long-term current use of insulin (H[facility])  - COMPLETE METABOLIC PANEL WITH GFR  4. Atrial fibrillation, chronic ([place])  - Protime-INR  5. Severe anemia  - CBC with Differential/Platelet  6. Chronic anticoagulation (INR goal 2.0-2.5)  -  Protime-INR  7. Medication management  - CBC  with Differential/Platelet - COMPLETE METABOLIC PANEL WITH GFR        Discussed  regular exercise, BP monitoring, weight control to achieve/maintain BMI less than 25 and discussed med and SE's. Recommended labs to assess and monitor clinical status with further disposition pending results of labs.  I discussed the assessment and treatment plan with the patient. The patient was provided an opportunity to ask questions and all were answered. The patient agreed with the plan and demonstrated an understanding of the instructions.  I provided over 30 minutes of exam, counseling, chart review and  complex critical decision making. Between 25-30 minutes of counseling, chart review, and critical decision making was performed          The patient was advised to call back or seek an in-person evaluation if the symptoms worsen or if the condition fails to improve as anticipated.   Kirtland Bouchard, MD

## 2020-04-30 ENCOUNTER — Other Ambulatory Visit: Payer: Self-pay

## 2020-04-30 ENCOUNTER — Ambulatory Visit (INDEPENDENT_AMBULATORY_CARE_PROVIDER_SITE_OTHER): Payer: Medicare Other | Admitting: Internal Medicine

## 2020-04-30 ENCOUNTER — Other Ambulatory Visit: Payer: Self-pay | Admitting: *Deleted

## 2020-04-30 ENCOUNTER — Encounter: Payer: Self-pay | Admitting: Internal Medicine

## 2020-04-30 VITALS — BP 118/74 | HR 57 | Temp 97.5°F | Resp 18 | Ht 71.0 in | Wt 189.6 lb

## 2020-04-30 DIAGNOSIS — Z794 Long term (current) use of insulin: Secondary | ICD-10-CM

## 2020-04-30 DIAGNOSIS — I10 Essential (primary) hypertension: Secondary | ICD-10-CM

## 2020-04-30 DIAGNOSIS — E1122 Type 2 diabetes mellitus with diabetic chronic kidney disease: Secondary | ICD-10-CM | POA: Diagnosis not present

## 2020-04-30 DIAGNOSIS — Z79899 Other long term (current) drug therapy: Secondary | ICD-10-CM

## 2020-04-30 DIAGNOSIS — Z7901 Long term (current) use of anticoagulants: Secondary | ICD-10-CM

## 2020-04-30 DIAGNOSIS — N184 Chronic kidney disease, stage 4 (severe): Secondary | ICD-10-CM | POA: Diagnosis not present

## 2020-04-30 DIAGNOSIS — E785 Hyperlipidemia, unspecified: Secondary | ICD-10-CM | POA: Diagnosis not present

## 2020-04-30 DIAGNOSIS — I482 Chronic atrial fibrillation, unspecified: Secondary | ICD-10-CM

## 2020-04-30 DIAGNOSIS — E1169 Type 2 diabetes mellitus with other specified complication: Secondary | ICD-10-CM | POA: Diagnosis not present

## 2020-04-30 DIAGNOSIS — D649 Anemia, unspecified: Secondary | ICD-10-CM | POA: Diagnosis not present

## 2020-04-30 MED ORDER — AZELASTINE HCL 0.1 % NA SOLN: 2.0000 | Freq: Two times a day (BID) | NASAL | 3 refills | Status: AC | PRN

## 2020-05-01 ENCOUNTER — Other Ambulatory Visit: Payer: Self-pay | Admitting: Internal Medicine

## 2020-05-01 DIAGNOSIS — I482 Chronic atrial fibrillation, unspecified: Secondary | ICD-10-CM

## 2020-05-01 DIAGNOSIS — Z7901 Long term (current) use of anticoagulants: Secondary | ICD-10-CM

## 2020-05-01 LAB — COMPLETE METABOLIC PANEL WITH GFR
AG Ratio: 1.5 (calc) (ref 1.0–2.5)
ALT: 22 U/L (ref 9–46)
AST: 20 U/L (ref 10–35)
Albumin: 4.1 g/dL (ref 3.6–5.1)
Alkaline phosphatase (APISO): 157 U/L — ABNORMAL HIGH (ref 35–144)
BUN/Creatinine Ratio: 20 (calc) (ref 6–22)
BUN: 33 mg/dL — ABNORMAL HIGH (ref 7–25)
CO2: 31 mmol/L (ref 20–32)
Calcium: 9 mg/dL (ref 8.6–10.3)
Chloride: 102 mmol/L (ref 98–110)
Creat: 1.61 mg/dL — ABNORMAL HIGH (ref 0.70–1.11)
GFR, Est African American: 46 mL/min/{1.73_m2} — ABNORMAL LOW (ref >=60)
GFR, Est Non African American: 40 mL/min/{1.73_m2} — ABNORMAL LOW (ref >=60)
Globulin: 2.8 g/dL (calc) (ref 1.9–3.7)
Glucose, Bld: 182 mg/dL — ABNORMAL HIGH (ref 65–99)
Potassium: 4.2 mmol/L (ref 3.5–5.3)
Sodium: 141 mmol/L (ref 135–146)
Total Bilirubin: 0.6 mg/dL (ref 0.2–1.2)
Total Protein: 6.9 g/dL (ref 6.1–8.1)

## 2020-05-01 LAB — CBC WITH DIFFERENTIAL/PLATELET
Absolute Monocytes: 794 cells/uL (ref 200–950)
Basophils Absolute: 39 cells/uL (ref 0–200)
Basophils Relative: 0.4 %
Eosinophils Absolute: 108 cells/uL (ref 15–500)
Eosinophils Relative: 1.1 %
HCT: 37.3 % — ABNORMAL LOW (ref 38.5–50.0)
Hemoglobin: 11.8 g/dL — ABNORMAL LOW (ref 13.2–17.1)
Lymphs Abs: 784 cells/uL — ABNORMAL LOW (ref 850–3900)
MCH: 29.8 pg (ref 27.0–33.0)
MCHC: 31.6 g/dL — ABNORMAL LOW (ref 32.0–36.0)
MCV: 94.2 fL (ref 80.0–100.0)
MPV: 8.8 fL (ref 7.5–12.5)
Monocytes Relative: 8.1 %
Neutro Abs: 8075 cells/uL — ABNORMAL HIGH (ref 1500–7800)
Neutrophils Relative %: 82.4 %
Platelets: 208 10*3/uL (ref 140–400)
RBC: 3.96 10*6/uL — ABNORMAL LOW (ref 4.20–5.80)
RDW: 17.2 % — ABNORMAL HIGH (ref 11.0–15.0)
Total Lymphocyte: 8 %
WBC: 9.8 10*3/uL (ref 3.8–10.8)

## 2020-05-01 LAB — PROTIME-INR
INR: 1.2 — ABNORMAL HIGH
Prothrombin Time: 12.4 s — ABNORMAL HIGH (ref 9.0–11.5)

## 2020-05-01 NOTE — Progress Notes (Signed)
============================================================ -   Test results slightly outside the reference range are not unusual. If there is anything important, I will review this with you,  otherwise it is considered normal test values.  If you have further questions,  please do not hesitate to contact me at the office or via My Chart.  ============================================================ ============================================================  -  CBC - shows Hgb 11.8 gm % - Stable  ============================================================ ============================================================  -  Kidney functions a little better - GFR up from 35 to 40 - Great  ============================================================ ============================================================  -  Protime /INR = 1.2 x - Low - Recommend Increase   - Warfarin 5 mg  for 3 days to 10 mg today, Thurs & Friday - Then   Change schedule to                                       10 Mg  2 x /week on Mon & Thurs                                            and 7.5 mg  the other 5 days  Tues Wed  Fri Sat Sun   Then call office to schedule a Nurse Visit for PT/INR   - May 16 or 17  ============================================================ ============================================================

## 2020-05-06 ENCOUNTER — Other Ambulatory Visit: Payer: Self-pay

## 2020-05-06 ENCOUNTER — Encounter (INDEPENDENT_AMBULATORY_CARE_PROVIDER_SITE_OTHER): Payer: Medicare Other | Admitting: Ophthalmology

## 2020-05-06 DIAGNOSIS — H34832 Tributary (branch) retinal vein occlusion, left eye, with macular edema: Secondary | ICD-10-CM

## 2020-05-06 DIAGNOSIS — H35033 Hypertensive retinopathy, bilateral: Secondary | ICD-10-CM | POA: Diagnosis not present

## 2020-05-06 DIAGNOSIS — H338 Other retinal detachments: Secondary | ICD-10-CM | POA: Diagnosis not present

## 2020-05-06 DIAGNOSIS — I10 Essential (primary) hypertension: Secondary | ICD-10-CM

## 2020-05-06 DIAGNOSIS — H2702 Aphakia, left eye: Secondary | ICD-10-CM | POA: Diagnosis not present

## 2020-05-06 DIAGNOSIS — H353112 Nonexudative age-related macular degeneration, right eye, intermediate dry stage: Secondary | ICD-10-CM | POA: Diagnosis not present

## 2020-05-06 DIAGNOSIS — H43813 Vitreous degeneration, bilateral: Secondary | ICD-10-CM

## 2020-05-20 ENCOUNTER — Other Ambulatory Visit: Payer: Self-pay

## 2020-05-20 ENCOUNTER — Ambulatory Visit (INDEPENDENT_AMBULATORY_CARE_PROVIDER_SITE_OTHER): Payer: Medicare Other

## 2020-05-20 DIAGNOSIS — I482 Chronic atrial fibrillation, unspecified: Secondary | ICD-10-CM | POA: Diagnosis not present

## 2020-05-20 DIAGNOSIS — Z7901 Long term (current) use of anticoagulants: Secondary | ICD-10-CM

## 2020-05-20 NOTE — Progress Notes (Signed)
Patient presents to the office for a nurse visit to have PT/INR checked. States that he is taking 2 tablets twice a week and 1.5 tablets all other days. Vitals taken and recorded.

## 2020-05-21 LAB — PROTIME-INR
INR: 2.4 — ABNORMAL HIGH
Prothrombin Time: 23.3 s — ABNORMAL HIGH (ref 9.0–11.5)

## 2020-05-21 NOTE — Progress Notes (Signed)
============================================================ -   Test results slightly outside the reference range are not unusual. If there is anything important, I will review this with you,  otherwise it is considered normal test values.  If you have further questions,  please do not hesitate to contact me at the office or via My Chart.  ============================================================ ============================================================  -  Pt / INR = 2.4 x - Great - Please continue Coumadin - Same  ============================================================ ============================================================

## 2020-05-30 ENCOUNTER — Encounter (HOSPITAL_COMMUNITY): Payer: Self-pay | Admitting: Internal Medicine

## 2020-05-30 ENCOUNTER — Ambulatory Visit (HOSPITAL_COMMUNITY)
Admission: RE | Admit: 2020-05-30 | Discharge: 2020-05-30 | Disposition: A | Discharge status: Home / Self Care | Payer: Medicare Other | Source: Ambulatory Visit | Attending: Internal Medicine | Admitting: Internal Medicine

## 2020-05-30 ENCOUNTER — Other Ambulatory Visit (HOSPITAL_COMMUNITY): Payer: Self-pay

## 2020-05-30 ENCOUNTER — Ambulatory Visit: Payer: Medicare Other | Admitting: Internal Medicine

## 2020-05-30 ENCOUNTER — Encounter: Payer: Self-pay | Admitting: Hematology

## 2020-05-30 ENCOUNTER — Other Ambulatory Visit: Payer: Self-pay

## 2020-05-30 VITALS — BP 138/78 | HR 65 | Wt 186.2 lb

## 2020-05-30 DIAGNOSIS — E1122 Type 2 diabetes mellitus with diabetic chronic kidney disease: Secondary | ICD-10-CM | POA: Diagnosis not present

## 2020-05-30 DIAGNOSIS — Z7982 Long term (current) use of aspirin: Secondary | ICD-10-CM | POA: Diagnosis not present

## 2020-05-30 DIAGNOSIS — I272 Pulmonary hypertension, unspecified: Secondary | ICD-10-CM | POA: Diagnosis not present

## 2020-05-30 DIAGNOSIS — N183 Chronic kidney disease, stage 3 unspecified: Secondary | ICD-10-CM | POA: Insufficient documentation

## 2020-05-30 DIAGNOSIS — I130 Hypertensive heart and chronic kidney disease with heart failure and stage 1 through stage 4 chronic kidney disease, or unspecified chronic kidney disease: Secondary | ICD-10-CM | POA: Insufficient documentation

## 2020-05-30 DIAGNOSIS — N1832 Chronic kidney disease, stage 3b: Secondary | ICD-10-CM | POA: Diagnosis not present

## 2020-05-30 DIAGNOSIS — I2510 Atherosclerotic heart disease of native coronary artery without angina pectoris: Secondary | ICD-10-CM | POA: Insufficient documentation

## 2020-05-30 DIAGNOSIS — I5032 Chronic diastolic (congestive) heart failure: Secondary | ICD-10-CM | POA: Insufficient documentation

## 2020-05-30 DIAGNOSIS — Z794 Long term (current) use of insulin: Secondary | ICD-10-CM | POA: Insufficient documentation

## 2020-05-30 DIAGNOSIS — Z8249 Family history of ischemic heart disease and other diseases of the circulatory system: Secondary | ICD-10-CM | POA: Diagnosis not present

## 2020-05-30 DIAGNOSIS — Z87891 Personal history of nicotine dependence: Secondary | ICD-10-CM | POA: Diagnosis not present

## 2020-05-30 DIAGNOSIS — Z79899 Other long term (current) drug therapy: Secondary | ICD-10-CM | POA: Diagnosis not present

## 2020-05-30 DIAGNOSIS — R0602 Shortness of breath: Secondary | ICD-10-CM | POA: Insufficient documentation

## 2020-05-30 DIAGNOSIS — Z951 Presence of aortocoronary bypass graft: Secondary | ICD-10-CM | POA: Diagnosis not present

## 2020-05-30 DIAGNOSIS — Q211 Atrial septal defect: Secondary | ICD-10-CM | POA: Diagnosis not present

## 2020-05-30 DIAGNOSIS — Z01810 Encounter for preprocedural cardiovascular examination: Secondary | ICD-10-CM

## 2020-05-30 DIAGNOSIS — Z7901 Long term (current) use of anticoagulants: Secondary | ICD-10-CM | POA: Diagnosis not present

## 2020-05-30 DIAGNOSIS — I480 Paroxysmal atrial fibrillation: Secondary | ICD-10-CM | POA: Diagnosis not present

## 2020-05-30 DIAGNOSIS — J449 Chronic obstructive pulmonary disease, unspecified: Secondary | ICD-10-CM | POA: Diagnosis not present

## 2020-05-30 MED ORDER — DAPAGLIFLOZIN PROPANEDIOL 5 MG PO TABS: 5.0000 mg | ORAL_TABLET | Freq: Every day | ORAL | 6 refills | Status: DC

## 2020-05-30 NOTE — Progress Notes (Signed)
Advanced Heart Failure Clinic Note .  Date:  05/30/2020   ID:  [name]., DOB [birth year], MRN [id number]  Location: Home  Provider location: [facility] Type of Visit: Established patient  PCP:  [name], MD  Cardiologist:  None Primary HF: [name]  Chief Complaint: Heart Failure follow-up   History of Present Illness:  [name] is an 80 y/o male with COPD , DM, PAF, 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, CKD, AFL s/p ablation in 10/15. 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 Had surgery in 4/15. Did well for a while went to cardiac rehab and was feeling fine.  Developed AFL in 10/15 and underwent RFA.   In 3/16 began to develop severe SOB. Started O2. Says his symptoms got worse almost overnight. 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 He has seen Dr. Roxy Manns but felt to have no optimal surgical options for repair. His case was also presented to Dr. Michaelle Birks at [facility] who agreed that there was no way to baffle or reroute the anomalous PV flow to the LA. He had a TEE which showed LVEF 60-65% with a dilated right side and a small PFO. He has also been seen by Dr. [place] Bells who performed PFTs that showed significant restrictive lung disease with a low DLCO. He had f/u with Dr. Gilles Chiquito in the [facility] who felt his symptoms were multifactorial.  Had colonoscopy in 2019 and found to have a polyp and what sounds like AVMs. Subsequently had a laparoscopic removal of appendiceal adenoma which was benign.   Has had IDA and been getting Feraheme.   Here for routine f/u. Remains on torsemide 129m in am (instead of 100/50). Takes metolazone 550ma couple of times per month when weight goes up. Fluid doing ok. Still SOB with very mild exertion. Walks for 60m660m every am. No orthopnea or PND. Recent Scr back down to  baseline 1.6  RHC 7/21  RA = 12 RV = 64/10 PA = 64/17 (32)   PCW = 18 (v=31) Fick cardiac output/index = 4.7/2.3 PVR = 2.2 WU Ao sat = 98% PA sat = 65%, 67% SVC sat = 52% Qp/Qs = 1.4    Cardiac studies:  Echo 02/20/19 LVEF 60-65% RV markedly dilated. Mod HK. RVSP 60.7 mmHg. Personally reviewed  Echo 12/18 LVEF 60% RV dilated mildly HK   PFTs (7/16) FEV1 1.45 L (45%) FVC 1.77 L (40%) DLCO 46%  RHC 4/16 RA = 18 RV = 72/4/17 PA = 76/27 (46) PCW = 21 Fick cardiac output/index (using PA sat) = 9.2/4.45 Thermo CO/CI = 10.0/4.87 PVR = 2.2 WU Fick cardiac output/index (using high SVC sat) = 5.2/2.5 Pulse-ox saturation = 89%  High SVC sat = 54% Low SVC sat = 81% (at SVC/RA junction) RA sat = 68% RV sat = 66% PA sat = 68%, 69% IVC sat =56%   VQ/CT negative for PE   TEE 10/15 small PFO  Ab u/s 6/16 liver normal + ascites. Medico renal kidney disease.     [place] symptoms worrisome for COVID 19.   Past Medical History:  Diagnosis Date  . Adrenal adenoma   . Arthritis   . Atypical atrial flutter ([facility]/15, 10/15   a. DCCV 08/2013. b. s/p RFA 10/2013.  .[name]  Basal cell carcinoma   . CAD (coronary artery disease)    a. 04/2013 CABG x 2: LIMA to LAD, SVG to RI, EVH via R thigh.  . Chronic diastolic congestive heart failure ([place])   . CKD (chronic kidney disease), stage III ([place])   . Depression   . Diabetes mellitus type II   . Diverticulosis 2001  . DJD (degenerative joint disease)   . GERD (gastroesophageal reflux disease)   . Gout   . History of cardioversion    x3 (years uncertain)  . Hx of adenomatous colonic polyps   . Hyperlipidemia   . Hypertension   . Hypertensive cardiomyopathy ([place])   . Hypertensive retinopathy    OU  . Macular degeneration    OU  . Obstructive sleep apnea    compliant with CPAP  . 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 Persistent atrial fibrillation ([place])    a. s/p MAZE 04/2013 in setting of CABG. b. Amio stopped in 10/2013 after flutter ablation.  [name] PFO (patent foramen ovale)    a. Small PFO by TEE 10/2013.  [name] Pleural effusion, left    a. s/p thoracentesis 05/2013.  [name] Respiratory failure ([place])    a. Hypoxia 10/2013 - required supp O2 as inpatient, did not require it at discharge.  . S/P Maze operation for atrial fibrillation    a. 04/2013: Complete bilateral atrial lesion set using cryothermy and bipolar radiofrequency ablation with clipping of LA appendage (@ time of CABG)   Past Surgical History:  Procedure Laterality Date  . APPENDECTOMY  03/05/2017   laproscopic  . ATRIAL FIBRILLATION ABLATION N/A 10/26/2013   Procedure: ATRIAL FIBRILLATION ABLATION;  Surgeon: Coralyn Mark, MD;  Location: [facility] CATH LAB;  Service: Cardiovascular;  Laterality: N/A;  . BASAL CELL CARCINOMA EXCISION     x3 on face  . CARDIAC CATHETERIZATION     myocardial bridge but no cad  . CARDIOVERSION N/A 08/23/2013   Procedure: CARDIOVERSION;  Surgeon: Sanda Klein, MD;  Location: [facility];  Service: Cardiovascular;  Laterality: N/A;  . CARPOMETACARPEL SUSPENSION PLASTY Left 02/14/2014   Procedure: CARPOMETACARPEL ([place]) SUSPENSIONPLASTY THUMB  WITH  ABDUCTOR POLLICIS LONGUS TRANSFER AND STENOSING TENOSYNOVITIS RELEASE LEFT WRIST;  Surgeon: Charlotte Crumb, MD;  Location: [facility];  Service: Orthopedics;  Laterality: Left;  . CATARACT EXTRACTION Bilateral   . COLONOSCOPY WITH PROPOFOL N/A 01/26/2017   Procedure: COLONOSCOPY WITH PROPOFOL;  Surgeon: Doran Stabler, MD;  Location: [facility];  Service: Gastroenterology;  Laterality: N/A;  . CORONARY ARTERY BYPASS GRAFT N/A 04/05/2013   Procedure: CORONARY ARTERY BYPASS GRAFTING (CABG) TIMES TWO USING LEFT INTERNAL MAMMARY ARTERY AND RIGHT SAPHENOUS LEG VEIN HARVESTED ENDOSCOPICALLY;  Surgeon: Rexene Alberts, MD;  Location:  [facility];  Service: Open Heart Surgery;  Laterality: N/A;  . ESOPHAGOGASTRODUODENOSCOPY (EGD) WITH PROPOFOL N/A 01/26/2017   Procedure: ESOPHAGOGASTRODUODENOSCOPY (EGD) WITH PROPOFOL;  Surgeon: Doran Stabler, MD;  Location: [facility];  Service: Gastroenterology;  Laterality: N/A;  . EYE SURGERY Bilateral    Cat Sx  . EYE SURGERY Right    RD repair - SB  . GREAT TOE ARTHRODESIS, INTERPHALANGEAL JOINT     Right foot  . INTRAOPERATIVE TRANSESOPHAGEAL ECHOCARDIOGRAM N/A 04/05/2013   Procedure: INTRAOPERATIVE TRANSESOPHAGEAL ECHOCARDIOGRAM;  Surgeon: Rexene Alberts, MD;  Location: [facility];  Service: Open Heart Surgery;  Laterality: N/A;  . LAPAROSCOPIC APPENDECTOMY  N/A 03/05/2017   Procedure: APPENDECTOMY LAPAROSCOPIC;  Surgeon: Judeth Horn, MD;  Location: [facility];  Service: General;  Laterality: N/A;  . LEFT HEART CATHETERIZATION WITH CORONARY ANGIOGRAM N/A 03/07/2013   Procedure: LEFT HEART CATHETERIZATION WITH CORONARY ANGIOGRAM;  Surgeon: Burnell Blanks, MD;  Location: [facility] CATH LAB;  Service: Cardiovascular;  Laterality: N/A;  . MAZE N/A 04/05/2013   Procedure: MAZE;  Surgeon: Rexene Alberts, MD;  Location: [facility];  Service: Open Heart Surgery;  Laterality: N/A;  . Polinydal cyst     Removed  . POLYPECTOMY    . Retina repair-right    . RETINAL DETACHMENT SURGERY Right    RD Repair - SB  . RIGHT HEART CATH N/A 07/06/2019   Procedure: RIGHT HEART CATH;  Surgeon: Jolaine Artist, MD;  Location: [facility] CV LAB;  Service: Cardiovascular;  Laterality: N/A;  . RIGHT HEART CATHETERIZATION N/A 05/03/2014   Procedure: RIGHT HEART CATH;  Surgeon: Jolaine Artist, MD;  Location: [facility] CATH LAB;  Service: Cardiovascular;  Laterality: N/A;  . TEE WITHOUT CARDIOVERSION N/A 08/23/2013   Procedure: TRANSESOPHAGEAL ECHOCARDIOGRAM (TEE);  Surgeon: Sanda Klein, MD;  Location: [facility] ENDOSCOPY;  Service: Cardiovascular;  Laterality: N/A;  . TEE WITHOUT CARDIOVERSION N/A 10/26/2013   Procedure:  TRANSESOPHAGEAL ECHOCARDIOGRAM (TEE);  Surgeon: Sueanne Margarita, MD;  Location: [facility] ENDOSCOPY;  Service: Cardiovascular;  Laterality: N/A;  . TEE WITHOUT CARDIOVERSION N/A 06/05/2014   Procedure: TRANSESOPHAGEAL ECHOCARDIOGRAM (TEE);  Surgeon: Thayer Headings, MD;  Location: [facility];  Service: Cardiovascular;  Laterality: N/A;  . TRAPEZIUM RESECTION       Current Outpatient Medications  Medication Sig Dispense Refill  . ACCU-CHEK AVIVA PLUS test strip CHECK BLOOD GLUCOSE 3 TIMES DAILY 300 strip 3  . acetaminophen (TYLENOL) 500 MG tablet Take 1,000 mg by mouth every 6 (six) hours as needed for moderate pain or headache.     . allopurinol (ZYLOPRIM) 300 MG tablet Take 300 mg by mouth daily.    [name] ALPRAZolam (XANAX) 1 MG tablet Take 0.5 tablets (0.5 mg total) by mouth at bedtime as needed for sleep. 60 tablet 0  . Alum Hydroxide-Mag Carbonate (GAVISCON PO) Take 4 tablets by mouth daily as needed (acid reflux).     . Ascorbic Acid (VITAMIN C) 1000 MG tablet Take 1,000 mg by mouth daily.    [name] aspirin EC 81 MG tablet Take 81 mg by mouth daily.    [name] atorvastatin (LIPITOR) 20 MG tablet Take  1 tablet  3 x /week  for Cholesterol 39 tablet 3  . azelastine (ASTELIN) 0.1 % nasal spray Place 2 sprays into both nostrils 2 (two) times daily as needed for rhinitis or allergies. 30 mL 3  . B Complex Vitamins (VITAMIN B COMPLEX PO) Take 1 tablet by mouth at bedtime.     . bacitracin 500 UNIT/GM ointment Apply 1 application topically daily as needed for wound care.    . Blood Glucose Monitoring Suppl (ACCU-CHEK AVIVA PLUS) w/Device KIT Check blood sugar 1 time  daily 1 kit 0  . Blood Glucose Monitoring Suppl DEVI Test blood sugar up to three times a day or as directed. 1 each 0  . carvedilol (COREG) 6.25 MG tablet Take 0.5 tablets (3.125 mg total) by mouth in the morning AND 1 tablet (6.25 mg total) every evening. 270 tablet 1  . Cholecalciferol (VITAMIN D PO) Take 5,000 Units by mouth daily.     .  diphenhydrAMINE (BENADRYL) 25 MG tablet Take 25 mg  by mouth at bedtime as needed for allergies.    . fluticasone (FLONASE) 50 MCG/ACT nasal spray Place 1 spray into both nostrils daily as needed for allergies. 16 g 3  . insulin NPH-regular Human (70-30) 100 UNIT/ML injection Inject 45 Units into the skin daily with breakfast. 35 units in the evening    . loratadine (CLARITIN) 10 MG tablet Take 10 mg by mouth daily as needed for allergies.     . metolazone (ZAROXOLYN) 2.5 MG tablet Take 1 tablet (2.5 mg total) by mouth daily as needed (for swelling). Needs appt for further refills 30 tablet 0  . Multiple Vitamins-Minerals (PRESERVISION AREDS 2) CAPS Take 1 capsule by mouth 2 (two) times daily.     . polyvinyl alcohol (LIQUIFILM TEARS) 1.4 % ophthalmic solution Place 1 drop into both eyes daily as needed for dry eyes.    . potassium chloride SA (KLOR-CON) 20 MEQ tablet TAKE 2 TABLETS BY MOUTH 3  TIMES DAILY FOR POTASSIUM 540 tablet 3  . sertraline (ZOLOFT) 100 MG tablet TAKE 1 TABLET BY MOUTH  DAILY FOR MOOD 90 tablet 3  . torsemide (DEMADEX) 100 MG tablet Take 150 mg by mouth daily. 1.5 tablet    . warfarin (COUMADIN) 5 MG tablet Take    1.5 tablets /day      or as directed 135 tablet 3  . zinc gluconate 50 MG tablet Take 50 mg by mouth daily.     No current facility-administered medications for this encounter.    Allergies:   Sunflower oil, Horse-derived products, Immune globulin, Tetanus toxoids, and Tetanus toxoid   Social History:  The patient  reports that he quit smoking about 39 years ago. His smoking use included cigarettes. He has a 100.00 pack-year smoking history. He has never used smokeless tobacco. He reports current alcohol use of about 1.0 standard drink of alcohol per week. He reports that he does not use drugs.   Family History:  The patient's family history includes Atrial fibrillation in his mother; Colon cancer in his mother; Colon polyps in his mother and sister; Dementia in  his father; Diabetes in his maternal uncle; Hypertension in his mother; Stroke in his paternal uncle.   Vitals:   05/30/20 1454  BP: 138/78  Pulse: 65  SpO2: 96%  Weight: 84.5 kg (186 lb 3.2 oz)    Exam:  General:  Elderly male. No resp difficulty HEENT: normal L eye red and irritated Neck: supple. JVP 9-10 Carotids 2+ bilat; no bruits. No lymphadenopathy or thryomegaly appreciated. Cor: PMI nondisplaced. Regular rate & rhythm. 2/6 TR Lungs: clear Abdomen: soft, nontender, nondistended. No hepatosplenomegaly. No bruits or masses. Good bowel sounds. Extremities: no cyanosis, clubbing, rash, 1-2+ edema Neuro: alert & orientedx3, cranial nerves grossly intact. moves all 4 extremities w/o difficulty. Affect pleasant  Recent Labs: 07/03/2019: B Natriuretic Peptide 264.2 02/06/2020: Magnesium 2.6; TSH 1.56 04/30/2020: ALT 22; BUN 33; Creat 1.61; Hemoglobin 11.8; Platelets 208; Potassium 4.2; Sodium 141  Personally reviewed   Wt Readings from Last 3 Encounters:  05/30/20 84.5 kg (186 lb 3.2 oz)  05/20/20 82.6 kg (182 lb)  04/30/20 86 kg (189 lb 9.6 oz)      ASSESSMENT AND PLAN:  1. Chronic diastolic HF with R>>L symptoms - NYHA III-IIIb - Volume status mildly elevated on exam but ReDS only 35% - Continue torsemide 112m daily with metolazone 57mas needed  - Add Farxiga 75m675maily - Echo 02/20/19 LVEF 60-65% RV markedly dilated. Mod HK. RVSP 60.7  mmHg.   2. RV failure & Pulmonary HTN - Has large left to right shunt through large anomalous pulmonary vein into SVC - Have reviewed with TCTS and Dr. Michaelle Birks at [facility]. No way to repair or baffle. Continue medical therapy. - Echo 02/20/19 LVEF 60-65% RV markedly dilated. Mod HK. RVSP 60.7 mmHg.  3. CAD s/p CABG - No s/s ischemia - Continue statin. Off ASA due to warfarin  4. AF s/p Maze  - s/p ablation 10/15  - remains in NSR today  - On warfarin. Has been on DOAC in past and didn't like it because of bleeding  5. CKD, stage 3-4,  baseline creatinine 1.7-1.8 - Creatinine up to 2.49 on 06/23/19 - Most recently Scr 1.6 on 04/30/20 - Repeat today - Add Farxiga 5  6. Pre-op surgical clearance - may need surgery to correct lens in left eye. He is at moderate risk for peri-op CV complications. OK to proceed but would avoid GA if at all possible.   Signed, Glori Bickers, MD  05/30/2020 3:12 PM  [facility] [place] [address] Heart and [place] [address] [phone] (office) [phone] (fax)

## 2020-05-30 NOTE — Patient Instructions (Signed)
Start Farxiga 5 mg Daily   Your physician recommends that you return for lab work in: 2 weeks  Your physician recommends that you schedule a follow-up appointment in: 4 months  If you have any questions or concerns before your next appointment please send [place] a message through [place] or call our office at [phone].    TO LEAVE A MESSAGE FOR THE NURSE SELECT OPTION 2, PLEASE LEAVE A MESSAGE INCLUDING: . YOUR NAME . DATE OF BIRTH . CALL BACK NUMBER . REASON FOR CALL**this is important as we prioritize the call backs  [place] AS LONG AS YOU CALL BEFORE 4:00 PM  At the [facility], you and your health needs are our priority. As part of our continuing mission to provide you with exceptional heart care, we have created designated Provider Care Teams. These Care Teams include your primary Cardiologist (physician) and Advanced Practice Providers (APPs- Physician Assistants and Nurse Practitioners) who all work together to provide you with the care you need, when you need it.   You may see any of the following providers on your designated Care Team at your next follow up: [name] Dr Glori Bickers . Dr Loralie Champagne . Dr Vickki Muff . Darrick Grinder, NP . Lyda Jester, [place] . Audry Riles, PharmD   Please be sure to bring in all your medications bottles to every appointment.

## 2020-05-30 NOTE — Progress Notes (Signed)
ReDS Vest / Clip - 05/30/20 1500      ReDS Vest / Clip   Station Marker D    Ruler Value 33    ReDS Value Range Low volume    ReDS Actual Value 35

## 2020-06-06 ENCOUNTER — Ambulatory Visit (INDEPENDENT_AMBULATORY_CARE_PROVIDER_SITE_OTHER): Payer: Medicare Other | Admitting: Internal Medicine

## 2020-06-06 ENCOUNTER — Encounter: Payer: Self-pay | Admitting: Internal Medicine

## 2020-06-06 ENCOUNTER — Other Ambulatory Visit: Payer: Self-pay

## 2020-06-06 VITALS — BP 138/78 | HR 68 | Temp 97.7°F | Resp 17 | Ht 71.0 in | Wt 183.6 lb

## 2020-06-06 DIAGNOSIS — N184 Chronic kidney disease, stage 4 (severe): Secondary | ICD-10-CM | POA: Diagnosis not present

## 2020-06-06 DIAGNOSIS — I10 Essential (primary) hypertension: Secondary | ICD-10-CM | POA: Diagnosis not present

## 2020-06-06 DIAGNOSIS — Z794 Long term (current) use of insulin: Secondary | ICD-10-CM | POA: Diagnosis not present

## 2020-06-06 DIAGNOSIS — Z7901 Long term (current) use of anticoagulants: Secondary | ICD-10-CM | POA: Diagnosis not present

## 2020-06-06 DIAGNOSIS — I482 Chronic atrial fibrillation, unspecified: Secondary | ICD-10-CM

## 2020-06-06 DIAGNOSIS — Z79899 Other long term (current) drug therapy: Secondary | ICD-10-CM | POA: Diagnosis not present

## 2020-06-06 DIAGNOSIS — E1122 Type 2 diabetes mellitus with diabetic chronic kidney disease: Secondary | ICD-10-CM | POA: Diagnosis not present

## 2020-06-06 NOTE — Progress Notes (Signed)
Future Appointments  Date Time Provider [place]  06/06/2020  3:30 PM [name], MD [facility] None  06/07/2020 12:30 PM Hayden Pedro, MD [facility] None  06/10/2020 12:30 PM Hayden Pedro, MD [facility] None  06/11/2020 11:00 AM Lincoln Brigham, PA-C [facility] None  07/01/2020  2:30 PM [name], MD [facility] None  09/13/2020 11:40 AM Bensimhon, Shaune Pascal, MD [facility] None  10/23/2020 11:00 AM [name], MD [facility] None  02/26/2021  3:30 PM Liane Comber, NP [facility] None    History of Present Illness:     The patient is a very nice 80 yo MWM with HTN, ASHD/CABG, chAfib, Pulm. HTN, COPD,HLD, T2_IDDM, COPDand Vitamin D Deficiency who returns for routine Coag f/u.  Last month his PT /INR was 23.3 /2.4 x  was therapeutic. Patient reports worsening DOE over the last month . He denies any worsening Orthopnea/PND or edema . General system review is negative otherwise.   Medications   .  FARXIGA 5 MG TABS tablet, Take 1 tablet (5 mg total) by mouth daily before breakfast. .  insulin NPH-regular Human (70-30) , Inject 45 Units into the skin daily with breakfast. 35 units in the evening  .  atorvastatin (20 MG tablet, Take  1 tablet  3 x /week  for Cholesterol .  carvedilol 6.25 MG tablet, Take 0.5 tablets  in the morning AND 1 tablet  every evening. .  metolazone  2.5 MG tablet, Take 1 tablet (2.5 mg total) by mouth daily as needed  .  torsemide  100 MG tablet, Take 150 mg  daily. 1.5 tablet .  ASTELIN 0.1 % nasal spray, Place 2 sprays into both nostrils 2  x daily as needed .  diphenhydrAMINE  25 MG tablet, Take at bedtime as needed for allergies. [name]  FLONASE nasal spray, Place 1 spray into both nostrils daily as needed for allergies. [name]  CLARITIN 10 MG tablet, Take  daily as needed for allergies.  [name]  acetaminophen 500 MG tablet, Take 1,000 mg by mouth every 6 (six) hours as needed for moderate pain or headache.  .  allopurinol  300 MG tablet, Take 300 mg by  mouth daily. [name]  aspirin EC 81 MG tablet, Take 81 mg by mouth daily. [name]  warfarin ) 5 MG tablet, Take    1.5 tablets /day      or as directed .  ALPRAZolam1 MG tablet, Take 0.5 tablets (0.5 mg total) by mouth at bedtime as needed for sleep. [name]  GAVISCON , Take 4 tablets by mouth daily as needed (acid reflux).  [name]  VITAMIN C 1000 MG tablet, Take 1,000 mg by mouth daily. .  B Complex Vitamins , Take 1 tablet by mouth at bedtime.  .  bacitracin 500 UNIT/GM ointment, Apply 1 application topically daily as needed for wound care. .  Blood Glucose Monitoring Suppl (ACCU-CHEK AVIVA PLUS) w/Device KIT, Check blood sugar 1 time  daily .  VITAMIN D 5,000 Units , Take  daily.  [name]  PRESERVISION AREDS 2 CAPS, Take 1 capsule  2  times daily.  [name]  LIQUIFILM TEARS 1.4 % ophthalmic solution, Place 1 drop into both eyes daily as needed for dry eyes. .  potassium chloride  20 MEQ tablet, TAKE 2 TABLETS  3  TIMES DAILY  .  sertraline  100 MG tablet, TAKE 1 TABLET  DAILY FOR MOOD .  zinc 50 MG tablet, Take  daily.  Problem list He has Hyperlipidemia associated  with type 2 diabetes mellitus ([place]); PAF (paroxysmal atrial fibrillation) ([place]); Diverticulosis of large intestine; Colonic Polyps; Chronic diastolic congestive heart failure ([place]); Hypertensive cardiomyopathy ([place]); 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; Type 2 diabetes mellitus with stage 4 chronic kidney disease, with long-term current use of insulin ([place]); GERD; DJD (degenerative joint disease); Vitamin D deficiency; Chronic anticoagulation (INR goal 2.0-2.5); Essential hypertension; CAD (coronary artery disease); PFO (patent foramen ovale); 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[place]); OSA on CPAP; S/P Maze operation for atrial fibrillation; Pulmonary hypertension ([place]); 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   Observations/Objective:  BP 138/78 (BP Location: Right Arm, Patient Position: Sitting, Cuff Size: Normal)   Pulse 68   Temp 97.7 F (36.5 C)   Resp 17   Ht 5' 11"  (1.803 m)   Wt 183 lb 9.6 oz (83.3 kg)   SpO2 99%   BMI 25.61 kg/m   HEENT - WNL. Neck - supple.  Chest - Clear equal BS. Cor - Nl HS. RRR w/o sig MGR. PP 1(+). No edema. MS- FROM w/o deformities.  Gait Nl. Neuro -  Nl w/o focal abnormalities.  Assessment and Plan:  1. Essential hypertension  - CBC with Differential/Platelet - COMPLETE METABOLIC PANEL WITH GFR  2. Atrial fibrillation, chronic ([place])  - Protime-INR  3. Type 2 diabetes mellitus with stage 4 chronic kidney disease, with long-term current use of insulin (HCC)  - CBC with Differential/Platelet - COMPLETE METABOLIC PANEL WITH GFR  4. Anticoagulation monitoring, INR range 2-3  - Protime-INR  5. Medication management  - CBC with Differential/Platelet - COMPLETE METABOLIC PANEL WITH GFR  Follow Up Instructions:       I discussed the assessment and treatment plan with the patient. The patient was provided an opportunity to ask questions and all were answered. The patient agreed with the plan and demonstrated an understanding of the instructions.       The patient was advised to call back or seek an in-person evaluation if the symptoms worsen or if the condition fails to improve as anticipated.    Kirtland Bouchard, MD

## 2020-06-07 ENCOUNTER — Encounter (INDEPENDENT_AMBULATORY_CARE_PROVIDER_SITE_OTHER): Payer: Medicare Other | Admitting: Ophthalmology

## 2020-06-07 LAB — CBC WITH DIFFERENTIAL/PLATELET
Absolute Monocytes: 760 cells/uL (ref 200–950)
Basophils Absolute: 40 cells/uL (ref 0–200)
Basophils Relative: 0.4 %
Eosinophils Absolute: 100 cells/uL (ref 15–500)
Eosinophils Relative: 1 %
HCT: 27.2 % — ABNORMAL LOW (ref 38.5–50.0)
Hemoglobin: 8.5 g/dL — ABNORMAL LOW (ref 13.2–17.1)
Lymphs Abs: 880 cells/uL (ref 850–3900)
MCH: 29 pg (ref 27.0–33.0)
MCHC: 31.3 g/dL — ABNORMAL LOW (ref 32.0–36.0)
MCV: 92.8 fL (ref 80.0–100.0)
MPV: 8.5 fL (ref 7.5–12.5)
Monocytes Relative: 7.6 %
Neutro Abs: 8220 cells/uL — ABNORMAL HIGH (ref 1500–7800)
Neutrophils Relative %: 82.2 %
Platelets: 293 10*3/uL (ref 140–400)
RBC: 2.93 10*6/uL — ABNORMAL LOW (ref 4.20–5.80)
RDW: 14.5 % (ref 11.0–15.0)
Total Lymphocyte: 8.8 %
WBC: 10 10*3/uL (ref 3.8–10.8)

## 2020-06-07 LAB — COMPLETE METABOLIC PANEL WITH GFR
AG Ratio: 1.6 (calc) (ref 1.0–2.5)
ALT: 16 U/L (ref 9–46)
AST: 16 U/L (ref 10–35)
Albumin: 4 g/dL (ref 3.6–5.1)
Alkaline phosphatase (APISO): 122 U/L (ref 35–144)
BUN/Creatinine Ratio: 26 (calc) — ABNORMAL HIGH (ref 6–22)
BUN: 50 mg/dL — ABNORMAL HIGH (ref 7–25)
CO2: 26 mmol/L (ref 20–32)
Calcium: 8.6 mg/dL (ref 8.6–10.3)
Chloride: 105 mmol/L (ref 98–110)
Creat: 1.89 mg/dL — ABNORMAL HIGH (ref 0.70–1.11)
GFR, Est African American: 38 mL/min/{1.73_m2} — ABNORMAL LOW (ref >=60)
GFR, Est Non African American: 33 mL/min/{1.73_m2} — ABNORMAL LOW (ref >=60)
Globulin: 2.5 g/dL (calc) (ref 1.9–3.7)
Glucose, Bld: 99 mg/dL (ref 65–99)
Potassium: 3.9 mmol/L (ref 3.5–5.3)
Sodium: 142 mmol/L (ref 135–146)
Total Bilirubin: 0.4 mg/dL (ref 0.2–1.2)
Total Protein: 6.5 g/dL (ref 6.1–8.1)

## 2020-06-07 LAB — PROTIME-INR
INR: 4.8 — ABNORMAL HIGH
Prothrombin Time: 43.9 s — ABNORMAL HIGH (ref 9.0–11.5)

## 2020-06-09 ENCOUNTER — Encounter (HOSPITAL_BASED_OUTPATIENT_CLINIC_OR_DEPARTMENT_OTHER): Payer: Self-pay | Admitting: Obstetrics and Gynecology

## 2020-06-09 ENCOUNTER — Other Ambulatory Visit: Payer: Self-pay

## 2020-06-09 ENCOUNTER — Emergency Department (HOSPITAL_BASED_OUTPATIENT_CLINIC_OR_DEPARTMENT_OTHER): Payer: Medicare Other

## 2020-06-09 ENCOUNTER — Inpatient Hospital Stay (HOSPITAL_BASED_OUTPATIENT_CLINIC_OR_DEPARTMENT_OTHER)
Admission: EM | Admit: 2020-06-09 | Discharge: 2020-06-13 | DRG: 378 | Disposition: A | Discharge status: Home / Self Care | Payer: Medicare Other | Attending: Internal Medicine | Admitting: Internal Medicine

## 2020-06-09 DIAGNOSIS — N184 Chronic kidney disease, stage 4 (severe): Secondary | ICD-10-CM

## 2020-06-09 DIAGNOSIS — Z888 Allergy status to other drugs, medicaments and biological substances status: Secondary | ICD-10-CM | POA: Diagnosis not present

## 2020-06-09 DIAGNOSIS — Z87891 Personal history of nicotine dependence: Secondary | ICD-10-CM

## 2020-06-09 DIAGNOSIS — I5032 Chronic diastolic (congestive) heart failure: Secondary | ICD-10-CM | POA: Diagnosis not present

## 2020-06-09 DIAGNOSIS — Z794 Long term (current) use of insulin: Secondary | ICD-10-CM

## 2020-06-09 DIAGNOSIS — Z9989 Dependence on other enabling machines and devices: Secondary | ICD-10-CM

## 2020-06-09 DIAGNOSIS — K2971 Gastritis, unspecified, with bleeding: Secondary | ICD-10-CM | POA: Diagnosis not present

## 2020-06-09 DIAGNOSIS — I43 Cardiomyopathy in diseases classified elsewhere: Secondary | ICD-10-CM | POA: Diagnosis not present

## 2020-06-09 DIAGNOSIS — B3781 Candidal esophagitis: Secondary | ICD-10-CM | POA: Diagnosis present

## 2020-06-09 DIAGNOSIS — I4510 Unspecified right bundle-branch block: Secondary | ICD-10-CM | POA: Diagnosis present

## 2020-06-09 DIAGNOSIS — I130 Hypertensive heart and chronic kidney disease with heart failure and stage 1 through stage 4 chronic kidney disease, or unspecified chronic kidney disease: Secondary | ICD-10-CM | POA: Diagnosis present

## 2020-06-09 DIAGNOSIS — D62 Acute posthemorrhagic anemia: Secondary | ICD-10-CM | POA: Diagnosis not present

## 2020-06-09 DIAGNOSIS — E1169 Type 2 diabetes mellitus with other specified complication: Secondary | ICD-10-CM | POA: Diagnosis not present

## 2020-06-09 DIAGNOSIS — I480 Paroxysmal atrial fibrillation: Secondary | ICD-10-CM | POA: Diagnosis present

## 2020-06-09 DIAGNOSIS — R791 Abnormal coagulation profile: Secondary | ICD-10-CM | POA: Diagnosis not present

## 2020-06-09 DIAGNOSIS — N183 Chronic kidney disease, stage 3 unspecified: Secondary | ICD-10-CM | POA: Diagnosis present

## 2020-06-09 DIAGNOSIS — E1122 Type 2 diabetes mellitus with diabetic chronic kidney disease: Secondary | ICD-10-CM | POA: Diagnosis not present

## 2020-06-09 DIAGNOSIS — K31811 Angiodysplasia of stomach and duodenum with bleeding: Secondary | ICD-10-CM | POA: Diagnosis not present

## 2020-06-09 DIAGNOSIS — I455 Other specified heart block: Secondary | ICD-10-CM | POA: Diagnosis present

## 2020-06-09 DIAGNOSIS — R9431 Abnormal electrocardiogram [ECG] [EKG]: Secondary | ICD-10-CM | POA: Diagnosis present

## 2020-06-09 DIAGNOSIS — H3530 Unspecified macular degeneration: Secondary | ICD-10-CM | POA: Diagnosis present

## 2020-06-09 DIAGNOSIS — J9811 Atelectasis: Secondary | ICD-10-CM | POA: Diagnosis not present

## 2020-06-09 DIAGNOSIS — E785 Hyperlipidemia, unspecified: Secondary | ICD-10-CM | POA: Diagnosis not present

## 2020-06-09 DIAGNOSIS — K922 Gastrointestinal hemorrhage, unspecified: Secondary | ICD-10-CM

## 2020-06-09 DIAGNOSIS — K31819 Angiodysplasia of stomach and duodenum without bleeding: Secondary | ICD-10-CM | POA: Diagnosis not present

## 2020-06-09 DIAGNOSIS — Z7901 Long term (current) use of anticoagulants: Secondary | ICD-10-CM | POA: Diagnosis not present

## 2020-06-09 DIAGNOSIS — Z91048 Other nonmedicinal substance allergy status: Secondary | ICD-10-CM | POA: Diagnosis not present

## 2020-06-09 DIAGNOSIS — D649 Anemia, unspecified: Secondary | ICD-10-CM | POA: Diagnosis present

## 2020-06-09 DIAGNOSIS — N1832 Chronic kidney disease, stage 3b: Secondary | ICD-10-CM | POA: Diagnosis not present

## 2020-06-09 DIAGNOSIS — K921 Melena: Secondary | ICD-10-CM | POA: Diagnosis not present

## 2020-06-09 DIAGNOSIS — M1711 Unilateral primary osteoarthritis, right knee: Secondary | ICD-10-CM | POA: Diagnosis not present

## 2020-06-09 DIAGNOSIS — K2970 Gastritis, unspecified, without bleeding: Secondary | ICD-10-CM

## 2020-06-09 DIAGNOSIS — G4733 Obstructive sleep apnea (adult) (pediatric): Secondary | ICD-10-CM | POA: Diagnosis not present

## 2020-06-09 DIAGNOSIS — E11649 Type 2 diabetes mellitus with hypoglycemia without coma: Secondary | ICD-10-CM | POA: Diagnosis present

## 2020-06-09 DIAGNOSIS — F32A Depression, unspecified: Secondary | ICD-10-CM | POA: Diagnosis not present

## 2020-06-09 DIAGNOSIS — Z91018 Allergy to other foods: Secondary | ICD-10-CM | POA: Diagnosis not present

## 2020-06-09 DIAGNOSIS — K5521 Angiodysplasia of colon with hemorrhage: Secondary | ICD-10-CM | POA: Diagnosis present

## 2020-06-09 DIAGNOSIS — K3189 Other diseases of stomach and duodenum: Secondary | ICD-10-CM | POA: Diagnosis not present

## 2020-06-09 DIAGNOSIS — Z951 Presence of aortocoronary bypass graft: Secondary | ICD-10-CM

## 2020-06-09 DIAGNOSIS — I484 Atypical atrial flutter: Secondary | ICD-10-CM | POA: Diagnosis not present

## 2020-06-09 DIAGNOSIS — K229 Disease of esophagus, unspecified: Secondary | ICD-10-CM | POA: Diagnosis not present

## 2020-06-09 DIAGNOSIS — I2510 Atherosclerotic heart disease of native coronary artery without angina pectoris: Secondary | ICD-10-CM | POA: Diagnosis present

## 2020-06-09 DIAGNOSIS — R531 Weakness: Secondary | ICD-10-CM | POA: Diagnosis not present

## 2020-06-09 DIAGNOSIS — Z20822 Contact with and (suspected) exposure to covid-19: Secondary | ICD-10-CM | POA: Diagnosis present

## 2020-06-09 DIAGNOSIS — K219 Gastro-esophageal reflux disease without esophagitis: Secondary | ICD-10-CM | POA: Diagnosis present

## 2020-06-09 DIAGNOSIS — I517 Cardiomegaly: Secondary | ICD-10-CM | POA: Diagnosis not present

## 2020-06-09 DIAGNOSIS — D500 Iron deficiency anemia secondary to blood loss (chronic): Secondary | ICD-10-CM | POA: Diagnosis not present

## 2020-06-09 DIAGNOSIS — Z79899 Other long term (current) drug therapy: Secondary | ICD-10-CM

## 2020-06-09 DIAGNOSIS — Z85828 Personal history of other malignant neoplasm of skin: Secondary | ICD-10-CM

## 2020-06-09 DIAGNOSIS — K5520 Angiodysplasia of colon without hemorrhage: Secondary | ICD-10-CM | POA: Diagnosis not present

## 2020-06-09 DIAGNOSIS — I4891 Unspecified atrial fibrillation: Secondary | ICD-10-CM | POA: Diagnosis not present

## 2020-06-09 DIAGNOSIS — Z8249 Family history of ischemic heart disease and other diseases of the circulatory system: Secondary | ICD-10-CM

## 2020-06-09 DIAGNOSIS — M109 Gout, unspecified: Secondary | ICD-10-CM | POA: Diagnosis present

## 2020-06-09 DIAGNOSIS — Z833 Family history of diabetes mellitus: Secondary | ICD-10-CM

## 2020-06-09 DIAGNOSIS — Z7982 Long term (current) use of aspirin: Secondary | ICD-10-CM

## 2020-06-09 LAB — CBC WITH DIFFERENTIAL/PLATELET
Abs Immature Granulocytes: 0.04 10*3/uL (ref 0.00–0.07)
Basophils Absolute: 0.1 10*3/uL (ref 0.0–0.1)
Basophils Relative: 1 %
Eosinophils Absolute: 0.1 10*3/uL (ref 0.0–0.5)
Eosinophils Relative: 1 %
HCT: 24.5 % — ABNORMAL LOW (ref 39.0–52.0)
Hemoglobin: 7.8 g/dL — ABNORMAL LOW (ref 13.0–17.0)
Immature Granulocytes: 0 %
Lymphocytes Relative: 8 %
Lymphs Abs: 0.9 10*3/uL (ref 0.7–4.0)
MCH: 30.1 pg (ref 26.0–34.0)
MCHC: 31.8 g/dL (ref 30.0–36.0)
MCV: 94.6 fL (ref 80.0–100.0)
Monocytes Absolute: 0.8 10*3/uL (ref 0.1–1.0)
Monocytes Relative: 8 %
Neutro Abs: 9 10*3/uL — ABNORMAL HIGH (ref 1.7–7.7)
Neutrophils Relative %: 82 %
Platelets: 275 10*3/uL (ref 150–400)
RBC: 2.59 MIL/uL — ABNORMAL LOW (ref 4.22–5.81)
RDW: 15.2 % (ref 11.5–15.5)
WBC: 10.9 10*3/uL — ABNORMAL HIGH (ref 4.0–10.5)
nRBC: 0 % (ref 0.0–0.2)

## 2020-06-09 LAB — COMPREHENSIVE METABOLIC PANEL
ALT: 15 U/L (ref 0–44)
AST: 16 U/L (ref 15–41)
Albumin: 3.9 g/dL (ref 3.5–5.0)
Alkaline Phosphatase: 111 U/L (ref 38–126)
Anion gap: 10 (ref 5–15)
BUN: 42 mg/dL — ABNORMAL HIGH (ref 8–23)
CO2: 24 mmol/L (ref 22–32)
Calcium: 8.1 mg/dL — ABNORMAL LOW (ref 8.9–10.3)
Chloride: 102 mmol/L (ref 98–111)
Creatinine, Ser: 1.87 mg/dL — ABNORMAL HIGH (ref 0.61–1.24)
GFR, Estimated: 36 mL/min — ABNORMAL LOW (ref >60)
Glucose, Bld: 115 mg/dL — ABNORMAL HIGH (ref 70–99)
Potassium: 4.3 mmol/L (ref 3.5–5.1)
Sodium: 136 mmol/L (ref 135–145)
Total Bilirubin: 0.4 mg/dL (ref 0.3–1.2)
Total Protein: 6.3 g/dL — ABNORMAL LOW (ref 6.5–8.1)

## 2020-06-09 LAB — CBG MONITORING, ED
Glucose-Capillary: 125 mg/dL — ABNORMAL HIGH (ref 70–99)
Glucose-Capillary: 145 mg/dL — ABNORMAL HIGH (ref 70–99)

## 2020-06-09 LAB — RESP PANEL BY RT-PCR (FLU A&B, COVID) ARPGX2
Influenza A by PCR: NEGATIVE
Influenza B by PCR: NEGATIVE
SARS Coronavirus 2 by RT PCR: NEGATIVE

## 2020-06-09 LAB — PROTIME-INR
INR: 6.1 (ref 0.8–1.2)
Prothrombin Time: 54 seconds — ABNORMAL HIGH (ref 11.4–15.2)

## 2020-06-09 LAB — OCCULT BLOOD X 1 CARD TO LAB, STOOL: Fecal Occult Bld: POSITIVE — AB

## 2020-06-09 MED ORDER — PHYTONADIONE 5 MG PO TABS
2.5000 mg | ORAL_TABLET | Freq: Once | ORAL | Status: AC
  Administered 2020-06-09: 2.5 mg via ORAL
  Filled 2020-06-09: qty 1

## 2020-06-09 MED ORDER — ACETAMINOPHEN 500 MG PO TABS
1000.0000 mg | ORAL_TABLET | Freq: Once | ORAL | Status: AC
  Administered 2020-06-09: 1000 mg via ORAL
  Filled 2020-06-09: qty 2

## 2020-06-09 MED ORDER — VITAMIN K1 10 MG/ML IJ SOLN
1.0000 mg | Freq: Once | INTRAVENOUS | Status: DC
  Filled 2020-06-09: qty 0.1

## 2020-06-09 NOTE — ED Provider Notes (Addendum)
[facility] EMERGENCY DEPT Provider Note   CSN: [id number] Arrival date & time: 06/09/20  1542     History Chief Complaint  Patient presents with  . Fall    [name]. is a 80 y.o. male.  HPI   Patient presents to the ED for evaluation of abnormal blood tests.  Patient states he went to his doctor's office the other day.  He was called today and was notified his blood tests were abnormal.  His hemoglobin had decreased.  His renal function was worse.  His INR was also elevated.  Patient has noticed he has been feeling intermittently weak for the last couple days.  As he was walking in the ED today he felt weak and slid down to the ground in front of his car.  Patient states he did not hit his head.  He did not lose consciousness.  He is not having any trouble with any chest pain or shortness of breath.  He denies any abdominal pain although he has noticed some dark stools recently.  Past Medical History:  Diagnosis Date  . Adrenal adenoma   . Arthritis   . Atypical atrial flutter ([place]) 8/15, 10/15   a. DCCV 08/2013. b. s/p RFA 10/2013.  [name] Basal cell carcinoma   . CAD (coronary artery disease)    a. 04/2013 CABG x 2: LIMA to LAD, SVG to RI, EVH via R thigh.  . Chronic diastolic congestive heart failure ([place])   . CKD (chronic kidney disease), stage III ([place])   . Depression   . Diabetes mellitus type II   . Diverticulosis 2001  . DJD (degenerative joint disease)   . GERD (gastroesophageal reflux disease)   . Gout   . History of cardioversion    x3 (years uncertain)  . Hx of adenomatous colonic polyps   . Hyperlipidemia   . Hypertension   . Hypertensive cardiomyopathy ([place])   . Hypertensive retinopathy    OU  . Macular degeneration    OU  . Obstructive sleep apnea    compliant with CPAP  . 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 Persistent atrial  fibrillation ([place])    a. s/p MAZE 04/2013 in setting of CABG. b. Amio stopped in 10/2013 after flutter ablation.  [name] PFO (patent foramen ovale)    a. Small PFO by TEE 10/2013.  [name] Pleural effusion, left    a. s/p thoracentesis 05/2013.  [name] Respiratory failure ([place])    a. Hypoxia 10/2013 - required supp O2 as inpatient, did not require it at discharge.  . S/P Maze operation for atrial fibrillation    a. 04/2013: Complete bilateral atrial lesion set using cryothermy and bipolar radiofrequency ablation with clipping of LA appendage (@ time of CABG)    Patient Active Problem List   Diagnosis Date Noted  . GI bleeding 06/09/2020  . Atrial fibrillation, chronic ([place]) 04/29/2020  . Severe anemia 04/29/2020  . Secondary hyperparathyroidism, renal ([place]) 03/22/2018  . Iron deficiency anemia   . Senile purpura ([place]) 11/11/2016  . CKD stage 3 due to type 2 diabetes mellitus ([place]) 01/11/2015  . 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 Chronic restrictive lung disease 09/03/2014  . Pulmonary hypertension ([place]) 08/23/2014  . 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[place]) 01/30/2014  . OSA on CPAP 01/30/2014  . PFO (patent foramen ovale) 10/30/2013  . CAD (coronary  artery disease) 10/29/2013  . Essential hypertension 10/25/2013  . Chronic anticoagulation (INR goal 2.0-2.5) 08/22/2013  . Atrial flutter ([place]) 08/21/2013  . Vitamin D deficiency 05/19/2013  . Type 2 diabetes mellitus with stage 4 chronic kidney disease, with long-term current use of insulin ([place])   . GERD   . DJD (degenerative joint disease)   . 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 S/P Maze operation for atrial fibrillation 04/05/2013  . Chronic diastolic congestive heart failure ([place])   . Hypertensive cardiomyopathy ([place])   . Hyperlipidemia associated with type 2 diabetes mellitus ([place]) 09/03/2008  . PAF (paroxysmal atrial fibrillation) ([place]) 09/03/2008  . Diverticulosis of large  intestine 09/03/2008  . Colonic Polyps 09/03/2008    Past Surgical History:  Procedure Laterality Date  . APPENDECTOMY  03/05/2017   laproscopic  . ATRIAL FIBRILLATION ABLATION N/A 10/26/2013   Procedure: ATRIAL FIBRILLATION ABLATION;  Surgeon: Coralyn Mark, MD;  Location: [facility] CATH LAB;  Service: Cardiovascular;  Laterality: N/A;  . BASAL CELL CARCINOMA EXCISION     x3 on face  . CARDIAC CATHETERIZATION     myocardial bridge but no cad  . CARDIOVERSION N/A 08/23/2013   Procedure: CARDIOVERSION;  Surgeon: Sanda Klein, MD;  Location: [facility];  Service: Cardiovascular;  Laterality: N/A;  . CARPOMETACARPEL SUSPENSION PLASTY Left 02/14/2014   Procedure: CARPOMETACARPEL ([place]) SUSPENSIONPLASTY THUMB  WITH  ABDUCTOR POLLICIS LONGUS TRANSFER AND STENOSING TENOSYNOVITIS RELEASE LEFT WRIST;  Surgeon: Charlotte Crumb, MD;  Location: [facility];  Service: Orthopedics;  Laterality: Left;  . CATARACT EXTRACTION Bilateral   . COLONOSCOPY WITH PROPOFOL N/A 01/26/2017   Procedure: COLONOSCOPY WITH PROPOFOL;  Surgeon: Doran Stabler, MD;  Location: [facility];  Service: Gastroenterology;  Laterality: N/A;  . CORONARY ARTERY BYPASS GRAFT N/A 04/05/2013   Procedure: CORONARY ARTERY BYPASS GRAFTING (CABG) TIMES TWO USING LEFT INTERNAL MAMMARY ARTERY AND RIGHT SAPHENOUS LEG VEIN HARVESTED ENDOSCOPICALLY;  Surgeon: Rexene Alberts, MD;  Location: [facility];  Service: Open Heart Surgery;  Laterality: N/A;  . ESOPHAGOGASTRODUODENOSCOPY (EGD) WITH PROPOFOL N/A 01/26/2017   Procedure: ESOPHAGOGASTRODUODENOSCOPY (EGD) WITH PROPOFOL;  Surgeon: Doran Stabler, MD;  Location: [facility];  Service: Gastroenterology;  Laterality: N/A;  . EYE SURGERY Bilateral    Cat Sx  . EYE SURGERY Right    RD repair - SB  . GREAT TOE ARTHRODESIS, INTERPHALANGEAL JOINT     Right foot  . INTRAOPERATIVE TRANSESOPHAGEAL ECHOCARDIOGRAM N/A 04/05/2013   Procedure: INTRAOPERATIVE TRANSESOPHAGEAL ECHOCARDIOGRAM;   Surgeon: Rexene Alberts, MD;  Location: [facility];  Service: Open Heart Surgery;  Laterality: N/A;  . LAPAROSCOPIC APPENDECTOMY N/A 03/05/2017   Procedure: APPENDECTOMY LAPAROSCOPIC;  Surgeon: Judeth Horn, MD;  Location: [facility];  Service: General;  Laterality: N/A;  . LEFT HEART CATHETERIZATION WITH CORONARY ANGIOGRAM N/A 03/07/2013   Procedure: LEFT HEART CATHETERIZATION WITH CORONARY ANGIOGRAM;  Surgeon: Burnell Blanks, MD;  Location: [facility] CATH LAB;  Service: Cardiovascular;  Laterality: N/A;  . MAZE N/A 04/05/2013   Procedure: MAZE;  Surgeon: Rexene Alberts, MD;  Location: [facility];  Service: Open Heart Surgery;  Laterality: N/A;  . Polinydal cyst     Removed  . POLYPECTOMY    . Retina repair-right    . RETINAL DETACHMENT SURGERY Right    RD Repair - SB  . RIGHT HEART CATH N/A 07/06/2019   Procedure: RIGHT HEART CATH;  Surgeon: Jolaine Artist, MD;  Location: [facility] CV LAB;  Service: Cardiovascular;  Laterality: N/A;  . RIGHT HEART CATHETERIZATION N/A 05/03/2014   Procedure: RIGHT HEART CATH;  Surgeon: Jolaine Artist, MD;  Location: [facility] CATH LAB;  Service: Cardiovascular;  Laterality: N/A;  . TEE WITHOUT CARDIOVERSION N/A 08/23/2013   Procedure: TRANSESOPHAGEAL ECHOCARDIOGRAM (TEE);  Surgeon: Sanda Klein, MD;  Location[facility] ENDOSCOPY;  Service: Cardiovascular;  Laterality: N/A;  . TEE WITHOUT CARDIOVERSION N/A 10/26/2013   Procedure: TRANSESOPHAGEAL ECHOCARDIOGRAM (TEE);  Surgeon: Sueanne Margarita, MD;  Location: [facility] ENDOSCOPY;  Service: Cardiovascular;  Laterality: N/A;  . TEE WITHOUT CARDIOVERSION N/A 06/05/2014   Procedure: TRANSESOPHAGEAL ECHOCARDIOGRAM (TEE);  Surgeon: Thayer Headings, MD;  Location: [facility] ENDOSCOPY;  Service: Cardiovascular;  Laterality: N/A;  . TRAPEZIUM RESECTION         Family History  Problem Relation Age of Onset  . Dementia Father   . Colon cancer Mother        Family History/Uncle   . Colon polyps Mother        Family History  . Atrial fibrillation  Mother   . Hypertension Mother   . Colon polyps Sister        Family history  . Diabetes Maternal Uncle   . Stroke Paternal Uncle     Social History   Tobacco Use  . Smoking status: Former Smoker    Packs/day: 4.00    Years: 25.00    Pack years: 100.00    Types: Cigarettes    Quit date: 01/05/1981    Years since quitting: 39.4  . Smokeless tobacco: Never Used  Vaping Use  . Vaping Use: Never used  Substance Use Topics  . Alcohol use: Yes    Alcohol/week: 1.0 standard drink    Types: 1 Shots of liquor per week    Comment: 1-5 drinks per week  . Drug use: No    Home Medications Prior to Admission medications   Medication Sig Start Date End Date Taking? Authorizing Provider  ACCU-CHEK AVIVA PLUS test strip CHECK BLOOD GLUCOSE 3 TIMES DAILY 11/26/19   [name], MD  acetaminophen (TYLENOL) 500 MG tablet Take 1,000 mg by mouth every 6 (six) hours as needed for moderate pain or headache.     [provider]  allopurinol (ZYLOPRIM) 300 MG tablet Take 300 mg by mouth daily.    [provider]  ALPRAZolam Duanne Moron) 1 MG tablet Take 0.5 tablets (0.5 mg total) by mouth at bedtime as needed for sleep. 03/23/16   [name], MD  Alum Hydroxide-Mag Carbonate (GAVISCON PO) Take 4 tablets by mouth daily as needed (acid reflux).     [provider]  Ascorbic Acid (VITAMIN C) 1000 MG tablet Take 1,000 mg by mouth daily.    [provider]  aspirin EC 81 MG tablet Take 81 mg by mouth daily.    [provider]  atorvastatin (LIPITOR) 20 MG tablet Take  1 tablet  3 x /week  for Cholesterol 04/21/20   [name], MD  azelastine (ASTELIN) 0.1 % nasal spray Place 2 sprays into both nostrils 2 (two) times daily as needed for rhinitis or allergies. 04/30/20   [name], MD  B Complex Vitamins (VITAMIN B COMPLEX PO) Take 1 tablet by mouth at bedtime.     [provider]  bacitracin 500 UNIT/GM ointment Apply 1 application  topically daily as needed for wound care.    [provider]  Blood Glucose Monitoring Suppl (ACCU-CHEK AVIVA PLUS) w/Device KIT Check blood sugar 1 time  daily 07/12/15   [name], MD  Blood Glucose Monitoring Suppl DEVI Test blood sugar up to three times a day or as directed. 05/24/17   Liane Comber, NP  carvedilol (COREG) 6.25 MG tablet Take 0.5 tablets (3.125 mg total) by mouth in the morning AND 1 tablet (6.25 mg total) every evening. 06/06/19   Bensimhon, Shaune Pascal, MD  Cholecalciferol (VITAMIN D PO) Take 5,000 Units by mouth daily.     [provider]  dapagliflozin propanediol (FARXIGA) 5 MG TABS tablet Take 1 tablet (5 mg total) by mouth daily before breakfast. 05/30/20   Bensimhon, Shaune Pascal, MD  diphenhydrAMINE (BENADRYL) 25 MG tablet Take 25 mg by mouth at bedtime as needed for allergies.    [provider]  fluticasone (FLONASE) 50 MCG/ACT nasal spray Place 1 spray into both nostrils daily as needed for allergies. 03/14/20   [name], MD  insulin NPH-regular Human (70-30) 100 UNIT/ML injection Inject 45 Units into the skin daily with breakfast. 35 units in the evening    [provider]  loratadine (CLARITIN) 10 MG tablet Take 10 mg by mouth daily as needed for allergies.     [provider]  metolazone (ZAROXOLYN) 2.5 MG tablet Take 1 tablet (2.5 mg total) by mouth daily as needed (for swelling). Needs appt for further refills 04/01/20   Bensimhon, Shaune Pascal, MD  Multiple Vitamins-Minerals (PRESERVISION AREDS 2) CAPS Take 1 capsule by mouth 2 (two) times daily.     [provider]  polyvinyl alcohol (LIQUIFILM TEARS) 1.4 % ophthalmic solution Place 1 drop into both eyes daily as needed for dry eyes.    [provider]  potassium chloride SA (KLOR-CON) 20 MEQ tablet TAKE 2 TABLETS BY MOUTH 3  TIMES DAILY FOR POTASSIUM 03/16/20   Liane Comber, NP  sertraline (ZOLOFT) 100 MG tablet TAKE 1 TABLET BY MOUTH  DAILY FOR MOOD  03/18/20   Bensimhon, Shaune Pascal, MD  torsemide (DEMADEX) 100 MG tablet Take 150 mg by mouth daily. 1.5 tablet    [provider]  warfarin (COUMADIN) 5 MG tablet Take    1.5 tablets /day      or as directed 12/11/19   [name], MD  zinc gluconate 50 MG tablet Take 50 mg by mouth daily.    [provider]    Allergies    Sunflower oil, Horse-derived products, Immune globulin, Tetanus toxoids, and Tetanus toxoid  Review of Systems   Review of Systems  All other systems reviewed and are negative.   Physical Exam Updated Vital Signs BP (!) 131/51   Pulse 73   Temp 98.1 F (36.7 C) (Oral)   Resp (!) 21   Ht 1.803 m (5' 11" )   Wt 83 kg   SpO2 95%   BMI 25.52 kg/m   Physical Exam Vitals and nursing note reviewed.  Constitutional:      Appearance: He is well-developed.  HENT:     Head: Normocephalic and atraumatic.     Right Ear: External ear normal.     Left Ear: External ear normal.  Eyes:     General: No scleral icterus.       Right eye: No discharge.        Left eye: No discharge.     Conjunctiva/sclera: Conjunctivae normal.     Comments: Conjunctive a pale  Neck:     Trachea: No tracheal deviation.  Cardiovascular:     Rate and Rhythm: Normal rate. Rhythm irregular.  Pulmonary:     Effort: Pulmonary effort is normal. No respiratory distress.     Breath sounds: Normal breath sounds. No stridor. No wheezing or rales.  Abdominal:     General: Bowel sounds are normal. There is no distension.     Palpations: Abdomen is soft.     Tenderness: There is no abdominal tenderness. There is no guarding or rebound.  Musculoskeletal:        General: No tenderness.     Cervical back: Neck supple.  Skin:    General: Skin is warm and dry.     Findings: No rash.  Neurological:     Mental Status: He is alert.     Cranial Nerves: No cranial nerve deficit (no facial droop, extraocular movements intact, no slurred speech).     Sensory: No sensory deficit.      Motor: No abnormal muscle tone or seizure activity.     Coordination: Coordination normal.     ED Results / Procedures / Treatments   Labs (all labs ordered are listed, but only abnormal results are displayed) Labs Reviewed  CBC WITH DIFFERENTIAL/PLATELET - Abnormal; Notable for the following components:      Result Value   WBC 10.9 (*)    RBC 2.59 (*)    Hemoglobin 7.8 (*)    HCT 24.5 (*)    Neutro Abs 9.0 (*)    All other components within normal limits  COMPREHENSIVE METABOLIC PANEL - Abnormal; Notable for the following components:   Glucose, Bld 115 (*)    BUN 42 (*)    Creatinine, Ser 1.87 (*)    Calcium 8.1 (*)    Total Protein 6.3 (*)    GFR, Estimated 36 (*)    All other components within normal limits  PROTIME-INR - Abnormal; Notable for the following components:   Prothrombin Time 54.0 (*)    INR 6.1 (*)    All other components within normal limits  OCCULT BLOOD X 1 CARD TO LAB, STOOL - Abnormal; Notable for the following components:   Fecal Occult Bld POSITIVE (*)    All other components within normal limits  CBG MONITORING, ED - Abnormal; Notable for the following components:   Glucose-Capillary 145 (*)    All other components within normal limits  RESP PANEL BY RT-PCR (FLU A&B, COVID) [id number]    EKG EKG Interpretation  Date/Time:  Sunday June 09 2020 15:52:13 EDT Ventricular Rate:  78 PR Interval:  297 QRS Duration: 184 QT Interval:  450 QTC Calculation: 513 R Axis:   130 Text Interpretation: Prolonged PR interval Right bundle branch block , noted on prior ecg Poor data quality  undetermined rhythm Confirmed by Dorie Rank [phone]) on 06/09/2020 4:04:52 PM   Radiology No results found.  Procedures .Critical Care Performed by: Dorie Rank, MD Authorized by: Dorie Rank, MD   Critical care provider statement:    Critical care time (minutes):  45   Critical care was time spent personally by me on the following activities:  Discussions with consultants,  evaluation of patient's response to treatment, examination of patient, ordering and performing treatments and interventions, ordering and review of laboratory studies, ordering and review of radiographic studies, pulse oximetry, re-evaluation of patient's condition, obtaining history from patient or surrogate and review of old charts     Medications Ordered in ED Medications  phytonadione (VITAMIN K) tablet 2.5 mg (2.5 mg Oral Given 06/09/20 1712)    ED Course  I have reviewed the triage vital  signs and the nursing notes.  Pertinent labs & imaging results that were available during my care of the patient were reviewed by me and considered in my medical decision making (see chart for details).  Clinical Course as of 06/09/20 1725  Sun Jun 09, 2020  1617 Hemoglobin is down to 7.8 from 8.5 just 3 days ago [JK]  1630 Notified that INR is 6.1 [JK]  1644 Fecal occult is positive. [JK]  6147 Renal function stable compared to 3 days ago. [JK]  1725 Case discussed with Dr Stevphen [facility] will consult on pt. [JK]    Clinical Course User Index [JK] Dorie Rank, MD   MDM Rules/Calculators/A&P                          Patient presents to the ED with complaints of increasing weakness and recent abnormal lab test showing worsening anemia and elevated INR.  Patient had an episode today where he felt weak and slid to the ground.  He denied any head injury or loss of consciousness.  Patient's ED work-up does show he has worsening anemia.  Hemoglobin is down to 7.8.  Patient's renal function is stable compared to a few days ago.  He does have elevated BUN.  This suggest possible upper GI source for his GI blood loss.  Patient does have an elevated INR at 6.1.  There is no signs of any active bleeding at this time with hematochezia or hematemesis.  I have ordered a dose of vitamin K.  Patient will need admission to the hospital for further evaluation.  He is currently at a freestanding ED and will require  transfer.  I will consult with the medical service for admission  Final Clinical Impression(s) / ED Diagnoses Final diagnoses:  Gastrointestinal hemorrhage, unspecified gastrointestinal hemorrhage type  Blood loss anemia  Elevated INR      Dorie Rank, MD 06/09/20 1700    Dorie Rank, MD 06/09/20 1725

## 2020-06-09 NOTE — Patient Instructions (Signed)
coumadfin Warfarin Coagulopathy Warfarin coagulopathy refers to bleeding that may occur as a complication of the medicine warfarin. Warfarin is a blood thinner (anticoagulant). Anticoagulants prevent dangerous blood clots. Bleeding is the most common and most serious complication of warfarin. While taking warfarin, you will need to have blood tests (prothrombin tests, or PT tests) regularly to measure your blood clotting time. The PT test results will be reported as the International Normalized Ratio (INR). The INR tells your health care provider whether your dosage of warfarin needs to be changed. The longer it takes your blood to clot, the higher the INR. Your risk of warfarin coagulopathy increases as your INR increases. What are the causes? This condition may be caused by:  Taking too much warfarin (overdose).  Underlying medical conditions.  Changes to your diet.  Interactions with medicines, supplements, or alcohol. What are the signs or symptoms? Warfarin coagulopathy may cause bleeding from any tissue or organ. Symptoms may include:  Bleeding from the gums.  A nosebleed that is not easily stopped.  Blood in stool. This may look like bright red, dark, or black, tarry stools.  Blood in urine. This may look like pink, red, or brown urine.  Unusual bruising or bruising easily.  A cut that does not stop bleeding within 10 minutes.  Coughing up blood.  Vomiting blood.  Feeling nauseous for longer than 1 day.  Broken blood vessels in the eye (subconjunctival hemorrhage). This may look like a bright red or dark red patch on the white part of the eye.  Abdominal or back pain with or without bruising.  Sudden, severe headache.  Sudden weakness or numbness of the face, arm, or leg, especially on one side of the body.  Sudden confusion.  Difficulty speaking (aphasia) or understanding speech.  Sudden trouble seeing out of one or both eyes.  Unexpected difficulty  walking.  Dizziness.  Loss of balance or coordination.  Unusual vaginal bleeding.  Swelling or pain at an injection site.  Skin scarring due to tissue death (necrosis) of fatty tissue. This may cause pain in the waist, thighs, or buttocks. This is more common among women. How is this diagnosed? This condition is diagnosed after your health care provider places you on warfarin and then finds out how it affects your blood's ability to clot. Prothrombin time (PT) clotting tests are used to monitor your clotting factor. These tests also help your health care provider to find the warfarin dose that is best for you. How is this treated? This condition is treated with vitamin K. Vitamin K helps the blood to clot. You may receive vitamin K every 12 hours, or as needed. You may also receive donated plasma (transfusions of fresh frozen plasma). Plasma is the liquid part of blood, and contains substances that help the blood clot. Follow these instructions at home: Medicines  Take warfarin exactly as told by your health care provider. This ensures that you avoid bleeding or clots that could result in serious injury, pain, or disability.  Take your medicine at the same time every day. If you forget to take your dose of warfarin, take it as soon as you remember on that day. If you do not remember to take it on that day, do not take an extra dose the next day.  Contact your health care provider if you miss a dose or take an extra dose. Do not change your dosage on your own to make up for missed or extra doses.  Talk with your  health care provider or your pharmacist before starting or stopping any new medicines. Many prescription and over-the-counter medicines can interfere with warfarin. This includes over-the-counter vitamins, dietary supplements, herbal medicines, and pain medicines. Your warfarin dosage may need to be adjusted. Eating and drinking  It is important to maintain a normal, balanced diet  while taking warfarin. Avoid major changes in your diet. If you are planning to change your diet, talk with your health care provider before making changes.  Your health care provider may recommend that you work with a diet and nutrition specialist (dietitian).  Vitamin K makes warfarin less effective. It is found in many foods. Eat a consistent amount of foods that contain vitamin K. For example, you may decide to eat 2 vitamin K-containing foods each day. Your warfarin dose is set according to the amount of vitamin K in your blood.  Eat a consistent amount of foods that contain vitamin K. Vitamin K makes warfarin less effective, so eating the same amount each day enables your health care provider to set the correct dose of warfarin. You may decide to eat 2 vitamin K-containing foods each day. Tests  Make sure to have PT tests at least once every 4-6 weeks for the entire time you are taking warfarin.  Ask your health care provider what your target INR range is. Make sure you always know your target range. If your INR is not in your target range, your health care provider may adjust your dosage.   Preventing bleeding and injury  Some common over-the-counter medicines and supplements may increase the risk of bleeding while taking warfarin. They include: ? Acetaminophen. ? Aspirin. ? NSAIDs, such as ibuprofen or naproxen. ? Vitamin E.  Avoid situations that cause bleeding. You may bleed more easily while taking warfarin. To limit bleeding, take the following actions: ? Use a softer toothbrush. ? Floss with waxed floss, not unwaxed floss. ? Shave with an electric razor, not with a blade. ? Limit your use of sharp objects. ? Avoid potentially harmful activities, such as contact sports. General instructions  Wear or carry identification that says that you are taking warfarin.  Make sure that all health care providers, including your dentist, know that you are taking warfarin.  If you need  surgery, tell your health care provider that you are taking warfarin. You may have to stop taking warfarin before your surgery.  If you plan to breastfeed or become pregnant while taking warfarin, talk with your health care provider.  Avoid alcohol, tobacco, and drugs. ? If your health care provider approves, limit alcohol intake to no more than 1 drink a day for non-pregnant women and 2 drinks a day for men. One drink equals 12 oz. of beer, 5 oz. of wine, or 1 oz. of hard liquor. ? If you change the amount of nicotine, tobacco, or alcohol you use, tell your health care provider.  Keep all follow-up visits and lab visits as told by your health care provider. This is very important because warfarin is a medicine that needs to be closely monitored. Contact a health care provider if:  You miss a dose.  You take an extra dose.  You plan to have any kind of surgery or procedure.  You are unable to take your medicine due to nausea, vomiting, or diarrhea.  You have any major changes in your diet, or you plan to make major changes in your diet.  You start or stop any over-the-counter medicine, prescription medicine, or dietary  supplement.  You become pregnant, plan to become pregnant, or think you may be pregnant.  You have menstrual periods that are heavier than usual, or unusual vaginal bleeding.  You have unusual bruising.  You lose your appetite.  You have a fever.  You have diarrhea that lasts for more than 24 hours. Get help right away if:  You develop symptoms of an allergic reaction, such as: ? Swelling of the lips, face, tongue, mouth, or throat. ? Rash. ? Itching. ? Itchy, red, swollen areas of skin (hives). ? Trouble breathing. ? Chest tightness.  You have any symptoms of stroke. BEFAST is an easy way to remember the main warning signs of stroke: ? B - Balance. Signs are dizziness, sudden trouble walking, or loss of balance. ? E - Eye. Signs are trouble seeing or a  sudden change in vision. ? F - Face. Signs are sudden weakness or numbness of the face, or the face or eyelid drooping on one side. ? A - Arm. Signs are weakness or numbness in an arm. This happens suddenly and usually on one side of the body. ? S - Speech. Signs are trouble speaking, slurred speech, or trouble understanding speech. ? T - Time. Time to call emergency services. Write down the time your symptoms started.  You have other signs of stroke, such as: ? A sudden, severe headache with no known cause. ? Nausea or vomiting. ? Seizure.  You have signs or symptoms of a blood clot, such as: ? Pain or swelling in your leg or arm. ? Skin that is red or warm to the touch on your arm or leg. ? Shortness of breath or difficulty breathing. ? Chest pain. ? Unexplained fever.  You have: ? A fall or have an accident, especially if you hit your head. ? Blood in your urine. Your urine may look reddish, pinkish, or tea-colored. ? Blood in your stool. Your stool may be black or bright red. ? Bleeding that does not stop after applying pressure to the area for 30 minutes. ? Severe pain in your joints or back. ? Purple or blue toes. ? Skin ulcers that do not go away.  You vomit blood or cough up blood. The blood may be bright red, or it may look like coffee grounds. These symptoms may represent a serious problem that is an emergency. Do not wait to see if the symptoms will go away. Get medical help right away. Call your local emergency services (911 in the [place] Do not drive yourself to the hospital. Summary  Warfarin needs to be closely monitored with blood tests. It is very important to keep all lab visits and follow-up visits with your health care provider. Make sure you know your target INR range and your warfarin dosage.  Monitor how much vitamin K you eat every day. Try to eat the same amount every day.  Wear or carry identification that says that you are taking warfarin.  Take  warfarin at the same time every day. Call your health care provider if you miss a dose or if you take an extra dose. Do not change the dosage of warfarin on your own.  Know the signs and symptoms of blood clots, bleeding, and stroke. Know when to get emergency medical help. This information is not intended to replace advice given to you by your health care provider. Make sure you discuss any questions you have with your health care provider. Document Revised: 06/22/2019 Document Reviewed: 06/22/2019 Elsevier Patient  Education  2021 [employer].

## 2020-06-09 NOTE — ED Notes (Signed)
Peanut butter crackers given to pt

## 2020-06-09 NOTE — Plan of Care (Signed)
Transfer from [facility] -> [facility] by the request of EDP Dr. Dorie Rank  Patient: [name] MRN [id number]  The patient is an 80 yo Caucasian male with a PMH of CAD, CKD Stage 3, GERD, Chronic Diastolic CHF, Atypical A Flutter and other comorbidities who is coming in with a slow GI bleed. He went to his primary care doctor the other day more for aroutine exam he got a call back today and was told that his blood tests were abnormal that his hemoglobin had decreased and his renal function was worse and INR was elevated. Patient did admit that he has been weaker, lightheaded and having dark stools. When he walked in to the ED today he got a little bit lightheaded and kind of slid to the ground although he did not hit his head.  Patient does thin his stools been a little bit darker but he has not been vomiting but has not noticed any bright red blood in the stool. His work-up in the ED does note worsening Hgb and he is Hemoccult is positive his hemoglobin 7.8; on April 26 it was 11.8 on June 2 and was 8.5 so it is trending downward still.  LABS Platelets okay White count 10.9 his serum creatinine is about the same as it was the other day at 1.87 has CKD; INR was elevated and he received Vitamin K  The EDP will call his gastroenterologist for further evaluation and he was accepted to medical telemetry as an Inpatient.

## 2020-06-09 NOTE — ED Triage Notes (Signed)
Patient reports to the ER for Fall with high INR. Patient reports his HgB and RBC's are low. Patient reports he slipped in front of the car. Patient reports he has a spot on his right elbow that he is unsure if it is acute or chronic.

## 2020-06-09 NOTE — Progress Notes (Signed)
============================================================ -   Test results slightly outside the reference range are not unusual. If there is anything important, I will review this with you,  otherwise it is considered normal test values.  If you have further questions,  please do not hesitate to contact me at the office or via My Chart.  ============================================================ ============================================================  -  Labs show significant drop in Hgb from 11.8 gm% to 8.5 gm %   - and PT /INR jumped up to 4.8 x   - CMET shows rise in BUN from 33 to 50 .   - Patient called and has been off of coumadin 2 days and                                                           reports having very dark ? Tarry stools   - Discussed with patient advising immediate ER evaluation to                              repeat CBC and Renal functions to evaluate need for                                                       possible blood transfusion and hospitalization ============================================================ ============================================================

## 2020-06-10 ENCOUNTER — Encounter (HOSPITAL_COMMUNITY): Payer: Self-pay | Admitting: Internal Medicine

## 2020-06-10 ENCOUNTER — Other Ambulatory Visit: Payer: Self-pay

## 2020-06-10 ENCOUNTER — Encounter (INDEPENDENT_AMBULATORY_CARE_PROVIDER_SITE_OTHER): Payer: Medicare Other | Admitting: Ophthalmology

## 2020-06-10 ENCOUNTER — Other Ambulatory Visit: Payer: Self-pay | Admitting: Physician Assistant

## 2020-06-10 DIAGNOSIS — E1169 Type 2 diabetes mellitus with other specified complication: Secondary | ICD-10-CM | POA: Diagnosis not present

## 2020-06-10 DIAGNOSIS — K5520 Angiodysplasia of colon without hemorrhage: Secondary | ICD-10-CM | POA: Diagnosis not present

## 2020-06-10 DIAGNOSIS — Z9989 Dependence on other enabling machines and devices: Secondary | ICD-10-CM

## 2020-06-10 DIAGNOSIS — F32A Depression, unspecified: Secondary | ICD-10-CM | POA: Diagnosis not present

## 2020-06-10 DIAGNOSIS — K5521 Angiodysplasia of colon with hemorrhage: Secondary | ICD-10-CM | POA: Diagnosis not present

## 2020-06-10 DIAGNOSIS — Z91048 Other nonmedicinal substance allergy status: Secondary | ICD-10-CM | POA: Diagnosis not present

## 2020-06-10 DIAGNOSIS — I43 Cardiomyopathy in diseases classified elsewhere: Secondary | ICD-10-CM | POA: Diagnosis present

## 2020-06-10 DIAGNOSIS — Z91018 Allergy to other foods: Secondary | ICD-10-CM | POA: Diagnosis not present

## 2020-06-10 DIAGNOSIS — I480 Paroxysmal atrial fibrillation: Secondary | ICD-10-CM | POA: Diagnosis present

## 2020-06-10 DIAGNOSIS — D500 Iron deficiency anemia secondary to blood loss (chronic): Secondary | ICD-10-CM

## 2020-06-10 DIAGNOSIS — I2510 Atherosclerotic heart disease of native coronary artery without angina pectoris: Secondary | ICD-10-CM | POA: Diagnosis present

## 2020-06-10 DIAGNOSIS — R791 Abnormal coagulation profile: Secondary | ICD-10-CM

## 2020-06-10 DIAGNOSIS — E785 Hyperlipidemia, unspecified: Secondary | ICD-10-CM | POA: Diagnosis present

## 2020-06-10 DIAGNOSIS — G4733 Obstructive sleep apnea (adult) (pediatric): Secondary | ICD-10-CM

## 2020-06-10 DIAGNOSIS — E1122 Type 2 diabetes mellitus with diabetic chronic kidney disease: Secondary | ICD-10-CM | POA: Diagnosis not present

## 2020-06-10 DIAGNOSIS — J9811 Atelectasis: Secondary | ICD-10-CM | POA: Diagnosis present

## 2020-06-10 DIAGNOSIS — B3781 Candidal esophagitis: Secondary | ICD-10-CM | POA: Diagnosis not present

## 2020-06-10 DIAGNOSIS — Z888 Allergy status to other drugs, medicaments and biological substances status: Secondary | ICD-10-CM | POA: Diagnosis not present

## 2020-06-10 DIAGNOSIS — I484 Atypical atrial flutter: Secondary | ICD-10-CM | POA: Diagnosis present

## 2020-06-10 DIAGNOSIS — R9431 Abnormal electrocardiogram [ECG] [EKG]: Secondary | ICD-10-CM | POA: Diagnosis present

## 2020-06-10 DIAGNOSIS — K2970 Gastritis, unspecified, without bleeding: Secondary | ICD-10-CM | POA: Diagnosis not present

## 2020-06-10 DIAGNOSIS — K921 Melena: Secondary | ICD-10-CM | POA: Diagnosis not present

## 2020-06-10 DIAGNOSIS — N1832 Chronic kidney disease, stage 3b: Secondary | ICD-10-CM | POA: Diagnosis present

## 2020-06-10 DIAGNOSIS — I5032 Chronic diastolic (congestive) heart failure: Secondary | ICD-10-CM | POA: Diagnosis not present

## 2020-06-10 DIAGNOSIS — K31811 Angiodysplasia of stomach and duodenum with bleeding: Secondary | ICD-10-CM | POA: Diagnosis not present

## 2020-06-10 DIAGNOSIS — E11649 Type 2 diabetes mellitus with hypoglycemia without coma: Secondary | ICD-10-CM | POA: Diagnosis not present

## 2020-06-10 DIAGNOSIS — K2971 Gastritis, unspecified, with bleeding: Secondary | ICD-10-CM | POA: Diagnosis not present

## 2020-06-10 DIAGNOSIS — K922 Gastrointestinal hemorrhage, unspecified: Secondary | ICD-10-CM | POA: Diagnosis not present

## 2020-06-10 DIAGNOSIS — M1711 Unilateral primary osteoarthritis, right knee: Secondary | ICD-10-CM | POA: Diagnosis present

## 2020-06-10 DIAGNOSIS — K31819 Angiodysplasia of stomach and duodenum without bleeding: Secondary | ICD-10-CM | POA: Diagnosis not present

## 2020-06-10 DIAGNOSIS — D62 Acute posthemorrhagic anemia: Secondary | ICD-10-CM | POA: Diagnosis not present

## 2020-06-10 DIAGNOSIS — Z20822 Contact with and (suspected) exposure to covid-19: Secondary | ICD-10-CM | POA: Diagnosis not present

## 2020-06-10 DIAGNOSIS — I130 Hypertensive heart and chronic kidney disease with heart failure and stage 1 through stage 4 chronic kidney disease, or unspecified chronic kidney disease: Secondary | ICD-10-CM | POA: Diagnosis not present

## 2020-06-10 LAB — MAGNESIUM: Magnesium: 2.8 mg/dL — ABNORMAL HIGH (ref 1.7–2.4)

## 2020-06-10 LAB — BASIC METABOLIC PANEL
Anion gap: 9 (ref 5–15)
BUN: 45 mg/dL — ABNORMAL HIGH (ref 8–23)
CO2: 25 mmol/L (ref 22–32)
Calcium: 8.4 mg/dL — ABNORMAL LOW (ref 8.9–10.3)
Chloride: 104 mmol/L (ref 98–111)
Creatinine, Ser: 2.01 mg/dL — ABNORMAL HIGH (ref 0.61–1.24)
GFR, Estimated: 33 mL/min — ABNORMAL LOW (ref >60)
Glucose, Bld: 60 mg/dL — ABNORMAL LOW (ref 70–99)
Potassium: 4.1 mmol/L (ref 3.5–5.1)
Sodium: 138 mmol/L (ref 135–145)

## 2020-06-10 LAB — GLUCOSE, CAPILLARY
Glucose-Capillary: 56 mg/dL — ABNORMAL LOW (ref 70–99)
Glucose-Capillary: 93 mg/dL (ref 70–99)
Glucose-Capillary: 64 mg/dL — ABNORMAL LOW (ref 70–99)
Glucose-Capillary: 86 mg/dL (ref 70–99)
Glucose-Capillary: 70 mg/dL (ref 70–99)
Glucose-Capillary: 169 mg/dL — ABNORMAL HIGH (ref 70–99)
Glucose-Capillary: 162 mg/dL — ABNORMAL HIGH (ref 70–99)

## 2020-06-10 LAB — PROTIME-INR
INR: 5.3 (ref 0.8–1.2)
Prothrombin Time: 48.2 seconds — ABNORMAL HIGH (ref 11.4–15.2)

## 2020-06-10 LAB — PREPARE RBC (CROSSMATCH)

## 2020-06-10 LAB — HEMOGLOBIN A1C
Hgb A1c MFr Bld: 7 % — ABNORMAL HIGH (ref 4.8–5.6)
Mean Plasma Glucose: 154 mg/dL

## 2020-06-10 LAB — HEMOGLOBIN AND HEMATOCRIT, BLOOD
HCT: 27.1 % — ABNORMAL LOW (ref 39.0–52.0)
Hemoglobin: 8.3 g/dL — ABNORMAL LOW (ref 13.0–17.0)

## 2020-06-10 LAB — HEMOGLOBIN: Hemoglobin: 6.9 g/dL — CL (ref 13.0–17.0)

## 2020-06-10 LAB — HEMATOCRIT: HCT: 22.4 % — ABNORMAL LOW (ref 39.0–52.0)

## 2020-06-10 MED ORDER — INSULIN DETEMIR 100 UNIT/ML ~~LOC~~ SOLN
20.0000 [IU] | Freq: Two times a day (BID) | SUBCUTANEOUS | Status: DC
  Filled 2020-06-10: qty 0.2

## 2020-06-10 MED ORDER — VITAMIN K1 10 MG/ML IJ SOLN
10.0000 mg | Freq: Once | INTRAVENOUS | Status: DC
  Filled 2020-06-10: qty 1

## 2020-06-10 MED ORDER — PANTOPRAZOLE SODIUM 40 MG IV SOLR
40.0000 mg | Freq: Two times a day (BID) | INTRAVENOUS | Status: DC
  Administered 2020-06-10 – 2020-06-12 (×5): 40 mg via INTRAVENOUS
  Filled 2020-06-10 (×6): qty 40

## 2020-06-10 MED ORDER — LORATADINE 10 MG PO TABS: 10.0000 mg | ORAL_TABLET | Freq: Every day | ORAL | Status: DC | PRN

## 2020-06-10 MED ORDER — ACETAMINOPHEN 650 MG RE SUPP: 650.0000 mg | Freq: Four times a day (QID) | RECTAL | Status: DC | PRN

## 2020-06-10 MED ORDER — ALPRAZOLAM 0.5 MG PO TABS: 0.5000 mg | ORAL_TABLET | Freq: Every evening | ORAL | Status: DC | PRN

## 2020-06-10 MED ORDER — DICLOFENAC SODIUM 1 % EX GEL
2.0000 g | Freq: Four times a day (QID) | CUTANEOUS | Status: DC
  Administered 2020-06-10 – 2020-06-13 (×9): 2 g via TOPICAL
  Filled 2020-06-10: qty 100

## 2020-06-10 MED ORDER — ALLOPURINOL 300 MG PO TABS
300.0000 mg | ORAL_TABLET | Freq: Every day | ORAL | Status: DC
  Administered 2020-06-10 – 2020-06-13 (×4): 300 mg via ORAL
  Filled 2020-06-10 (×4): qty 1

## 2020-06-10 MED ORDER — FLUTICASONE PROPIONATE 50 MCG/ACT NA SUSP: 1.0000 | Freq: Every day | NASAL | Status: DC | PRN

## 2020-06-10 MED ORDER — CARVEDILOL 6.25 MG PO TABS
6.2500 mg | ORAL_TABLET | Freq: Two times a day (BID) | ORAL | Status: DC
  Administered 2020-06-10 – 2020-06-13 (×6): 6.25 mg via ORAL
  Filled 2020-06-10 (×8): qty 1

## 2020-06-10 MED ORDER — INSULIN ASPART 100 UNIT/ML IJ SOLN
0.0000 [IU] | INTRAMUSCULAR | Status: DC
  Administered 2020-06-10 (×2): 2 [IU] via SUBCUTANEOUS
  Administered 2020-06-11: 3 [IU] via SUBCUTANEOUS
  Administered 2020-06-11: 2 [IU] via SUBCUTANEOUS
  Administered 2020-06-11: 3 [IU] via SUBCUTANEOUS
  Administered 2020-06-11: 5 [IU] via SUBCUTANEOUS
  Administered 2020-06-11: 1 [IU] via SUBCUTANEOUS
  Administered 2020-06-12: 2 [IU] via SUBCUTANEOUS

## 2020-06-10 MED ORDER — ATORVASTATIN CALCIUM 10 MG PO TABS
10.0000 mg | ORAL_TABLET | ORAL | Status: DC
  Administered 2020-06-10 – 2020-06-12 (×2): 10 mg via ORAL
  Filled 2020-06-10 (×2): qty 1

## 2020-06-10 MED ORDER — SODIUM CHLORIDE 0.9% IV SOLUTION: Freq: Once | INTRAVENOUS | Status: AC

## 2020-06-10 MED ORDER — POLYVINYL ALCOHOL 1.4 % OP SOLN: 1.0000 [drp] | Freq: Every day | OPHTHALMIC | Status: DC | PRN

## 2020-06-10 MED ORDER — SERTRALINE HCL 100 MG PO TABS
100.0000 mg | ORAL_TABLET | Freq: Every day | ORAL | Status: DC
  Administered 2020-06-10 – 2020-06-13 (×4): 100 mg via ORAL
  Filled 2020-06-10 (×4): qty 1

## 2020-06-10 MED ORDER — ACETAMINOPHEN 500 MG PO TABS
1000.0000 mg | ORAL_TABLET | Freq: Four times a day (QID) | ORAL | Status: DC | PRN
  Administered 2020-06-10 – 2020-06-13 (×5): 1000 mg via ORAL
  Filled 2020-06-10 (×5): qty 2

## 2020-06-10 NOTE — H&P (Signed)
History and Physical    [name]. ERX:[id number] DOB: 14-Jan-1940 DOA: 06/09/2020  PCP: [name], MD   Patient coming from: Home   Chief Complaint: Lightheaded, dark stool, low Hgb and increased creatinine and INR   HPI: [name]. is a 80 y.o. male with medical history significant for paroxysmal atrial fibrillation on warfarin, CKD IIIb, chronic diastolic CHF, CAD status post CABG, insulin-dependent diabetes mellitus, and OSA on CPAP, now presenting to the emergency department for evaluation of lightheadedness, dark stool, and outpatient blood work with low hemoglobin and elevated creatinine and INR.  Patient reports that he had dark stool for roughly 10 days before having a more normal-appearing stool yesterday.  He has been intermittently lightheaded but attributes some of this to recent hypoglycemic episodes.  He denies any chest pain or syncope.  He denies abdominal pain, nausea, or vomiting.  He uses acetaminophen as needed for osteoarthritis but not NSAIDs.  He had outpatient blood work performed, was notified of elevated INR, troponin hemoglobin, and increased creatinine, and sent to the ED for evaluation.  [facility] ED Course: Upon arrival to the ED, patient is found to be afebrile, saturating well on room air, and with normal heart rate and blood pressure.  EKG features sinus rhythm with first-degree AV nodal block, RBBB, and QTc interval 513 ms.  Chest x-ray negative for acute cardiopulmonary disease.  Chemistry panel features a BUN of 42 and creatinine 1.87.  CBC with mild leukocytosis and hemoglobin 7.8.  INR 6.1.  Patient was given 2.5 mg oral vitamin K and 1 g of acetaminophen in the ED.  Gastroenterology was consulted by the ED physician and hospitalist at [facility] was asked to admit.  Review of Systems:  All other systems reviewed and apart from HPI, are negative.  Past Medical History:  Diagnosis Date  . Adrenal adenoma   . Arthritis    . Atypical atrial flutter ([place]) 8/15, 10/15   a. DCCV 08/2013. b. s/p RFA 10/2013.  [name] Basal cell carcinoma   . CAD (coronary artery disease)    a. 04/2013 CABG x 2: LIMA to LAD, SVG to RI, EVH via R thigh.  . Chronic diastolic congestive heart failure ([place])   . CKD (chronic kidney disease), stage III ([place])   . Depression   . Diabetes mellitus type II   . Diverticulosis 2001  . DJD (degenerative joint disease)   . GERD (gastroesophageal reflux disease)   . Gout   . History of cardioversion    x3 (years uncertain)  . Hx of adenomatous colonic polyps   . Hyperlipidemia   . Hypertension   . Hypertensive cardiomyopathy ([place])   . Hypertensive retinopathy    OU  . Macular degeneration    OU  . Obstructive sleep apnea    compliant with CPAP  . 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 Persistent atrial fibrillation ([place])    a. s/p MAZE 04/2013 in setting of CABG. b. Amio stopped in 10/2013 after flutter ablation.  [name] PFO (patent foramen ovale)    a. Small PFO by TEE 10/2013.  [name] Pleural effusion, left    a. s/p thoracentesis 05/2013.  [name] Respiratory failure ([place])    a. Hypoxia 10/2013 - required supp O2 as inpatient, did not require it at discharge.  . S/P Maze operation for atrial fibrillation    a. 04/2013: Complete  bilateral atrial lesion set using cryothermy and bipolar radiofrequency ablation with clipping of LA appendage (@ time of CABG)    Past Surgical History:  Procedure Laterality Date  . APPENDECTOMY  03/05/2017   laproscopic  . ATRIAL FIBRILLATION ABLATION N/A 10/26/2013   Procedure: ATRIAL FIBRILLATION ABLATION;  Surgeon: Coralyn Mark, MD;  Location: [facility] CATH [facility];  Service: Cardiovascular;  Laterality: N/A;  . BASAL CELL CARCINOMA EXCISION     x3 on face  . CARDIAC CATHETERIZATION     myocardial bridge but no cad  . CARDIOVERSION N/A 08/23/2013   Procedure:  CARDIOVERSION;  Surgeon: Sanda Klein, MD;  Location: [facility];  Service: Cardiovascular;  Laterality: N/A;  . CARPOMETACARPEL SUSPENSION PLASTY Left 02/14/2014   Procedure: CARPOMETACARPEL ([place]) SUSPENSIONPLASTY THUMB  WITH  ABDUCTOR POLLICIS LONGUS TRANSFER AND STENOSING TENOSYNOVITIS RELEASE LEFT WRIST;  Surgeon: Charlotte Crumb, MD;  Location: [place];  Service: Orthopedics;  Laterality: Left;  . CATARACT EXTRACTION Bilateral   . COLONOSCOPY WITH PROPOFOL N/A 01/26/2017   Procedure: COLONOSCOPY WITH PROPOFOL;  Surgeon: Doran Stabler, MD;  Location: [facility];  Service: Gastroenterology;  Laterality: N/A;  . CORONARY ARTERY BYPASS GRAFT N/A 04/05/2013   Procedure: CORONARY ARTERY BYPASS GRAFTING (CABG) TIMES TWO USING LEFT INTERNAL MAMMARY ARTERY AND RIGHT SAPHENOUS LEG VEIN HARVESTED ENDOSCOPICALLY;  Surgeon: Rexene Alberts, MD;  Location: [place];  Service: Open Heart Surgery;  Laterality: N/A;  . ESOPHAGOGASTRODUODENOSCOPY (EGD) WITH PROPOFOL N/A 01/26/2017   Procedure: ESOPHAGOGASTRODUODENOSCOPY (EGD) WITH PROPOFOL;  Surgeon: Doran Stabler, MD;  Location: [facility];  Service: Gastroenterology;  Laterality: N/A;  . EYE SURGERY Bilateral    Cat Sx  . EYE SURGERY Right    RD repair - SB  . GREAT TOE ARTHRODESIS, INTERPHALANGEAL JOINT     Right foot  . INTRAOPERATIVE TRANSESOPHAGEAL ECHOCARDIOGRAM N/A 04/05/2013   Procedure: INTRAOPERATIVE TRANSESOPHAGEAL ECHOCARDIOGRAM;  Surgeon: Rexene Alberts, MD;  Location: [place];  Service: Open Heart Surgery;  Laterality: N/A;  . LAPAROSCOPIC APPENDECTOMY N/A 03/05/2017   Procedure: APPENDECTOMY LAPAROSCOPIC;  Surgeon: Judeth Horn, MD;  Location: [place] [facility];  Service: General;  Laterality: N/A;  . LEFT HEART CATHETERIZATION WITH CORONARY ANGIOGRAM N/A 03/07/2013   Procedure: LEFT HEART CATHETERIZATION WITH CORONARY ANGIOGRAM;  Surgeon: Burnell Blanks, MD;  Location: [facility] CATH LAB;  Service: Cardiovascular;  Laterality:  N/A;  . MAZE N/A 04/05/2013   Procedure: MAZE;  Surgeon: Rexene Alberts, MD;  Location: [place];  Service: Open Heart Surgery;  Laterality: N/A;  . Polinydal cyst     Removed  . POLYPECTOMY    . Retina repair-right    . RETINAL DETACHMENT SURGERY Right    RD Repair - SB  . RIGHT HEART CATH N/A 07/06/2019   Procedure: RIGHT HEART CATH;  Surgeon: Jolaine Artist, MD;  Location: [facility] CV LAB;  Service: Cardiovascular;  Laterality: N/A;  . RIGHT HEART CATHETERIZATION N/A 05/03/2014   Procedure: RIGHT HEART CATH;  Surgeon: Jolaine Artist, MD;  Location: [facility] CATH LAB;  Service: Cardiovascular;  Laterality: N/A;  . TEE WITHOUT CARDIOVERSION N/A 08/23/2013   Procedure: TRANSESOPHAGEAL ECHOCARDIOGRAM (TEE);  Surgeon: Sanda Klein, MD;  Location: [facility] ENDOSCOPY;  Service: Cardiovascular;  Laterality: N/A;  . TEE WITHOUT CARDIOVERSION N/A 10/26/2013   Procedure: TRANSESOPHAGEAL ECHOCARDIOGRAM (TEE);  Surgeon: Sueanne Margarita, MD;  Location: [facility] ENDOSCOPY;  Service: Cardiovascular;  Laterality: N/A;  . TEE WITHOUT CARDIOVERSION N/A 06/05/2014   Procedure: TRANSESOPHAGEAL ECHOCARDIOGRAM (TEE);  Surgeon:  Thayer Headings, MD;  Location: [facility];  Service: Cardiovascular;  Laterality: N/A;  . TRAPEZIUM RESECTION      Social History:   reports that he quit smoking about 39 years ago. His smoking use included cigarettes. He has a 100.00 pack-year smoking history. He has never used smokeless tobacco. He reports current alcohol use of about 1.0 standard drink of alcohol per week. He reports that he does not use drugs.  Allergies  Allergen Reactions  . Sunflower Oil Swelling and Other (See Comments)    Tongue and lip swelling Other reaction(s): Other Tongue and lip swelling  . Horse-Derived Products Other (See Comments)    Per allergy skin test UNSPECIFIED REACTION  Other reaction(s): Other Per allergy skin test UNSPECIFIED REACTION   . Immune Globulin   . Tetanus Toxoids Other (See  Comments)    Per allergy skin test  . Tetanus Toxoid     Other reaction(s): Other (See Comments) Rash(horse serum) Other reaction(s): Other Rash(horse serum)    Family History  Problem Relation Age of Onset  . Dementia Father   . Colon cancer Mother        Family History/Uncle   . Colon polyps Mother        Family History  . Atrial fibrillation Mother   . Hypertension Mother   . Colon polyps Sister        Family history  . Diabetes Maternal Uncle   . Stroke Paternal Uncle      Prior to Admission medications   Medication Sig Start Date End Date Taking? Authorizing Provider  ACCU-CHEK AVIVA PLUS test strip CHECK BLOOD GLUCOSE 3 TIMES DAILY 11/26/19   [name], MD  acetaminophen (TYLENOL) 500 MG tablet Take 1,000 mg by mouth every 6 (six) hours as needed for moderate pain or headache.     [provider]  allopurinol (ZYLOPRIM) 300 MG tablet Take 300 mg by mouth daily.    [provider]  ALPRAZolam Duanne Moron) 1 MG tablet Take 0.5 tablets (0.5 mg total) by mouth at bedtime as needed for sleep. 03/23/16   [name], MD  Alum Hydroxide-Mag Carbonate (GAVISCON PO) Take 4 tablets by mouth daily as needed (acid reflux).     [provider]  Ascorbic Acid (VITAMIN C) 1000 MG tablet Take 1,000 mg by mouth daily.    [provider]  aspirin EC 81 MG tablet Take 81 mg by mouth daily.    [provider]  atorvastatin (LIPITOR) 20 MG tablet Take  1 tablet  3 x /week  for Cholesterol 04/21/20   [name], MD  azelastine (ASTELIN) 0.1 % nasal spray Place 2 sprays into both nostrils 2 (two) times daily as needed for rhinitis or allergies. 04/30/20   [name], MD  B Complex Vitamins (VITAMIN B COMPLEX PO) Take 1 tablet by mouth at bedtime.     [provider]  bacitracin 500 UNIT/GM ointment Apply 1 application topically daily as needed for wound care.    [provider]  Blood Glucose Monitoring Suppl  (ACCU-CHEK AVIVA PLUS) w/Device KIT Check blood sugar 1 time  daily 07/12/15   [name], MD  Blood Glucose Monitoring Suppl DEVI Test blood sugar up to three times a day or as directed. 05/24/17   Liane Comber, NP  carvedilol (COREG) 6.25 MG tablet Take 0.5 tablets (3.125 mg total) by mouth in the morning AND 1 tablet (6.25 mg total) every evening. 06/06/19   Bensimhon, Shaune Pascal, MD  Cholecalciferol (VITAMIN D PO) Take 5,000 Units by mouth daily.     [provider]  dapagliflozin propanediol (FARXIGA) 5 MG TABS tablet Take 1 tablet (5 mg total) by mouth daily before breakfast. 05/30/20   Bensimhon, Shaune Pascal, MD  diphenhydrAMINE (BENADRYL) 25 MG tablet Take 25 mg by mouth at bedtime as needed for allergies.    [provider]  fluticasone (FLONASE) 50 MCG/ACT nasal spray Place 1 spray into both nostrils daily as needed for allergies. 03/14/20   [name], MD  insulin NPH-regular Human (70-30) 100 UNIT/ML injection Inject 45 Units into the skin daily with breakfast. 35 units in the evening    [provider]  loratadine (CLARITIN) 10 MG tablet Take 10 mg by mouth daily as needed for allergies.     [provider]  metolazone (ZAROXOLYN) 2.5 MG tablet Take 1 tablet (2.5 mg total) by mouth daily as needed (for swelling). Needs appt for further refills 04/01/20   Bensimhon, Shaune Pascal, MD  Multiple Vitamins-Minerals (PRESERVISION AREDS 2) CAPS Take 1 capsule by mouth 2 (two) times daily.     [provider]  polyvinyl alcohol (LIQUIFILM TEARS) 1.4 % ophthalmic solution Place 1 drop into both eyes daily as needed for dry eyes.    [provider]  potassium chloride SA (KLOR-CON) 20 MEQ tablet TAKE 2 TABLETS BY MOUTH 3  TIMES DAILY FOR POTASSIUM 03/16/20   Liane Comber, NP  sertraline (ZOLOFT) 100 MG tablet TAKE 1 TABLET BY MOUTH  DAILY FOR MOOD 03/18/20   Bensimhon, Shaune Pascal, MD  torsemide (DEMADEX) 100 MG tablet Take 150 mg by mouth daily. 1.5  tablet    [provider]  warfarin (COUMADIN) 5 MG tablet Take    1.5 tablets /day      or as directed 12/11/19   [name], MD  zinc gluconate 50 MG tablet Take 50 mg by mouth daily.    [provider]    Physical Exam: Vitals:   06/09/20 2359 06/10/20 0059 06/10/20 0226 06/10/20 0302  BP:  (!) 128/58 127/88 129/71  Pulse: 75 68 65 66  Resp:  16 16 18   Temp:   98 F (36.7 C) 98.2 F (36.8 C)  TempSrc:   Oral Oral  SpO2: 97% 98% 98% 98%  Weight:      Height:        Constitutional: NAD, calm  Eyes: PERTLA, lids and conjunctivae normal ENMT: Mucous membranes are moist. Posterior pharynx clear of any exudate or lesions.   Neck:  supple, no masses  Respiratory:  no wheezing, no crackles. No accessory muscle use.  Cardiovascular: S1 & S2 heard, regular rate and rhythm. Mild pretibial edema bilaterally.   Abdomen: No distension, no tenderness, soft. Bowel sounds active.  Musculoskeletal: no clubbing / cyanosis. Mild right knee swelling without erythema, heat, or tenderness.   Skin: no significant rashes, lesions, ulcers. Warm, dry, well-perfused. Neurologic: CN 2-12 grossly intact. Sensation intact. Moving all extremities.  Psychiatric: Alert and oriented to person, place, and situation. Pleasant and cooperative.    Labs and Imaging on Admission: I have personally reviewed following labs and imaging studies  CBC: Recent Labs  Lab 06/06/20 1536 06/09/20 1601  WBC 10.0 10.9*  NEUTROABS 8,220* 9.0*  HGB 8.5* 7.8*  HCT 27.2* 24.5*  MCV 92.8 94.6  PLT 293 812   Basic Metabolic Panel: Recent Labs  Lab 06/06/20 1536 06/09/20 1601  NA 142 136  K 3.9 4.3  CL 105 102  CO2 26 24  GLUCOSE 99 115*  BUN 50* 42*  CREATININE 1.89* 1.87*  CALCIUM 8.6 8.1*   GFR: Estimated Creatinine Clearance: 33 mL/min (A) (by C-G formula based on SCr of 1.87 mg/dL (H)). Liver Function Tests: Recent Labs  Lab 06/06/20 1536 06/09/20 1601  AST 16 16  ALT 16 15   ALKPHOS  --  111  BILITOT 0.4 0.4  PROT 6.5 6.3*  ALBUMIN  --  3.9   No results for input(s): LIPASE, AMYLASE in the last 168 hours. No results for input(s): AMMONIA in the last 168 hours. Coagulation Profile: Recent Labs  Lab 06/06/20 1536 06/09/20 1601  INR 4.8* 6.1*   Cardiac Enzymes: No results for input(s): CKTOTAL, CKMB, CKMBINDEX, TROPONINI in the last 168 hours. BNP (last 3 results) No results for input(s): PROBNP in the last 8760 hours. HbA1C: No results for input(s): HGBA1C in the last 72 hours. CBG: Recent Labs  Lab 06/09/20 1549 06/09/20 2358  GLUCAP 145* 125*   Lipid Profile: No results for input(s): CHOL, HDL, LDLCALC, TRIG, CHOLHDL, LDLDIRECT in the last 72 hours. Thyroid Function Tests: No results for input(s): TSH, T4TOTAL, FREET4, T3FREE, THYROIDAB in the last 72 hours. Anemia Panel: No results for input(s): VITAMINB12, FOLATE, FERRITIN, TIBC, IRON, RETICCTPCT in the last 72 hours. Urine analysis:    Component Value Date/Time   COLORURINE YELLOW 10/11/2019 1004   APPEARANCEUR CLEAR 10/11/2019 1004   LABSPEC 1.008 10/11/2019 1004   PHURINE 6.5 10/11/2019 1004   GLUCOSEU NEGATIVE 10/11/2019 1004   GLUCOSEU 500 06/04/2010 1626   HGBUR NEGATIVE 10/11/2019 1004   BILIRUBINUR NEGATIVE 03/23/2016 1529   KETONESUR NEGATIVE 10/11/2019 1004   PROTEINUR NEGATIVE 10/11/2019 1004   UROBILINOGEN 0.2 03/05/2014 1627   NITRITE NEGATIVE 10/11/2019 1004   LEUKOCYTESUR NEGATIVE 10/11/2019 1004   Sepsis Labs: @LABRCNTIP (procalcitonin:4,lacticidven:4) ) Recent Results (from the past 240 hour(s))  Resp Panel by RT-PCR (Flu A&B, Covid) Nasopharyngeal Swab     Status: None   Collection Time: 06/09/20  4:54 PM   Specimen: Nasopharyngeal Swab; Nasopharyngeal(NP) swabs in vial transport medium  Result Value Ref Range Status   SARS Coronavirus 2 by RT PCR NEGATIVE NEGATIVE Final    Comment: (NOTE) SARS-CoV-2 target nucleic acids are NOT DETECTED.  The SARS-CoV-2  RNA is generally detectable in upper respiratory specimens during the acute phase of infection. The lowest concentration of SARS-CoV-2 viral copies this assay can detect is 138 copies/mL. A negative result does not preclude SARS-Cov-2 infection and should not be used as the sole basis for treatment or other patient management decisions. A negative result may occur with  improper specimen collection/handling, submission of specimen other than nasopharyngeal swab, presence of viral mutation(s) within the areas targeted by this assay, and inadequate number of viral copies(<138 copies/mL). A negative result must be combined with clinical observations, patient history, and epidemiological information. The expected result is Negative.  Fact Sheet for Patients:  EntrepreneurPulse.com.au  Fact Sheet for Healthcare Providers:  IncredibleEmployment.be  This test is no t yet approved or cleared by the [place] FDA and  has been authorized for detection and/or diagnosis of SARS-CoV-2 by FDA under an Emergency Use Authorization (EUA). This EUA will remain  in effect (meaning this test can be used) for the duration of the COVID-19 declaration under Section 564(b)(1) of the Act, 21 U.S.C.section 360bbb-3(b)(1), unless the authorization is terminated  or revoked sooner.       Influenza A by PCR NEGATIVE NEGATIVE Final   Influenza B  by PCR NEGATIVE NEGATIVE Final    Comment: (NOTE) The Xpert Xpress SARS-CoV-2/FLU/RSV plus assay is intended as an aid in the diagnosis of influenza from Nasopharyngeal swab specimens and should not be used as a sole basis for treatment. Nasal washings and aspirates are unacceptable for Xpert Xpress SARS-CoV-2/FLU/RSV testing.  Fact Sheet for Patients: EntrepreneurPulse.com.au  Fact Sheet for Healthcare Providers: IncredibleEmployment.be  This test is not yet approved or cleared by the  [place] FDA and has been authorized for detection and/or diagnosis of SARS-CoV-2 by FDA under an Emergency Use Authorization (EUA). This EUA will remain in effect (meaning this test can be used) for the duration of the COVID-19 declaration under Section 564(b)(1) of the Act, 21 U.S.C. section 360bbb-3(b)(1), unless the authorization is terminated or revoked.  Performed at [facility], [address], [place], [place] [address]      Radiological Exams on Admission: DG Chest Portable 1 View  Result Date: 06/09/2020 CLINICAL DATA:  Weakness EXAM: PORTABLE CHEST 1 VIEW COMPARISON:  Chest x-ray dated 02/06/2020. FINDINGS: Stable cardiomegaly. Median sternotomy wires appear intact and stable in alignment. Central pulmonary vascular congestion persists, likely chronic. Coarse lung markings bilaterally, LEFT greater than RIGHT, suggesting chronic interstitial lung disease/fibrosis. No new confluent opacity to suggest a developing pneumonia. No pleural effusion or pneumothorax is seen. IMPRESSION: 1. No active disease. No evidence of pneumonia or pulmonary edema. 2. Stable cardiomegaly.  Suspect chronic mild CHF. 3. 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 Electronically Signed   By: Franki Cabot M.D.   On: 06/09/2020 17:28    EKG: Independently reviewed. Sinus rhythm, 1st degree AV block, RBBB, QTc 513 ms.   Assessment/Plan   1. 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 Paroxysmal atrial fibrillation  - In sinus rhythm on  admission  - Hold warfarin in setting of GIB    3. Insulin-dependent DM; hypoglycemia   - A1c was 8.2% in February  - He had a CBG of 56 on arrival to [facility]  - Continue CBG checks, use SSI only for now     4. CKD IIIb  - SCr is 1.87 on admission, up from 1.61 in April 2022  - Renally-dose medications, monitor    5. CAD  - No anginal complaints, hold ASA for now    6. Chronic diastolic CHF  - Appears compensated  - Hold diuretics while NPO    7. Prolonged QT interval  - QTc was 513 ms in ED  - Minimize QT-prolonging medications, check magnesium     DVT prophylaxis: warfarin  Code Status: Full  Level of Care: Level of care: Telemetry Family Communication: none present  Disposition Plan:  Patient is from: home  Anticipated d/c is to: Home  Anticipated d/c date is: 06/12/20 Patient currently: Pending repeat INR and H&H, GI evaluation  Consults called: GI consulted by ED physician  Admission status: Inpatient    Vianne Bulls, MD [facility]  06/10/2020, 4:17 AM

## 2020-06-10 NOTE — Progress Notes (Signed)
Inpatient Diabetes Program Recommendations  AACE/ADA: New Consensus Statement on Inpatient Glycemic Control (2015)  Target Ranges:  Prepandial:   less than 140 mg/dL      Peak postprandial:   less than 180 mg/dL (1-2 hours)      Critically ill patients:  140 - 180 mg/dL   Lab Results  Component Value Date   GLUCAP 86 06/10/2020   HGBA1C 8.2 (H) 02/06/2020    Review of Glycemic Control Results for [name], DELUCIA (MRN [id number]) as of 06/10/2020 10:44  Ref. Range 06/09/2020 23:58 06/10/2020 04:27 06/10/2020 05:19 06/10/2020 07:44 06/10/2020 08:30  Glucose-Capillary Latest Ref Range: 70 - 99 mg/dL 125 (H) 56 (L) 93 64 (L) 86   Diabetes history: DM 2 Outpatient Diabetes medications: 70/3045 units qam, 35 units qpm, Farxiga 5 mg Daily Current orders for Inpatient glycemic control:  Novolog 0-9 units Q4 hours  Inpatient Diabetes Program Recommendations:    Elevated renal function hypoglycemia without insulin administration  - may consider to reduce Novolog Correction to "very sensitive" 0-6 units Q 4 hours.  Thanks, Tama Headings RN, MSN, BC-ADM Inpatient Diabetes Coordinator Team Pager [phone] (8a-5p)

## 2020-06-10 NOTE — Progress Notes (Signed)
Hypoglycemic Event  CBG: 56  Treatment: 8 oz juice/soda  Symptoms: Vision changes  Follow-up CBG: Time:05:20  CBG Result:93  Possible Reasons for Event: Inadequate meal intake  [name] C [place] Nies

## 2020-06-10 NOTE — Consult Note (Addendum)
Referring Provider:  [facility]         Primary Care Physician:  [name], MD Primary Gastroenterologist:  Wilfrid Lund, MD          We were asked to see this patient for:   anemia , Heme + stool           ASSESSMENT / PLAN:   # 80 yo male with multiple medical problems as listed below. Admitted with  UGI bleed in setting of supratherapeutic INR. He is FOBT+ but no overt bleeding for a few days. Prior to that he had several days of black stool. Current GI bleed probably secondary to gastrointestinal AVMs. He does have a history of iron deficiency anemia followed by Hematology. He gets periodic IV rion.  --Received low dose of PO vitamin K yesterday afternoon. Minimal improvement in INR ( 5.3 today) --Continue BID IV PPI --He has a unit of blood ordered --He will need EGD with INR down to acceptable level. Supportive care in the interim  # CKD IIIb  # Prolonged QTc, 513 ms in ED.   # Chronic diastolic heart failure  #  Diabetes, on insulin  # PAF, on coumadin at home.   # Hx of adenomatous colon polyps- 15 mm polyp at appendiceal orifice wa biopsied (tubular adenoma Jan 2019) A 3 year interval recall colonoscopy was recommended for Feb 2022 but not yet done.   Addendum: patient had laparoscopic appendectomy in 2019 for above adenomatous polyps so follow up colonoscopy should not be needed    HPI:                                                                                                                             Chief Complaint:  anemia  [name]. is a 80 y.o. male with a past medical history significant for paroxysmal atrial fibrillation on warfarin, CKD 3B, chronic diastolic CHF, CAD SP CABG, diabetes, OSA on CPAP, hx of IDA.   January 2019 -established care with [place] after referral from PCP for evaluation of dark, Hemoccult negative stool and anemia on warfarin.  He was started on oral iron and scheduled for EGD and a colonoscopy.  EGD was  remarkable for single bleeding angiectasia (treated).  Colonoscopy revealing of diverticulosis, a 15 mm polyp at the appendiceal orifice (removed and path compatible with tubular adenoma)   INTERVAL HISTORY:   Patient presented to ED yesterday after a fall. INR found to be 6.1, hgb 7.8, down from 11.8 late April. Hgb has since continued to decline, at 6.9 today. Stool are FOBT+. He had several days of black stools but BMs back to normal for last few days. Other than a daily baby ASA he doesn't take NSAIDs.  Not on a PPI, takes Gaviscon at needed for acid reflux. He doesn't take iron ( tried but couldn't tolerate due to constipation). He has been SOB. No abdominal pain, N/V.  Awaiting blood transfusion  PREVIOUS ENDOSCOPIC EVALUATIONS / [place] STUDIES   Jan 2019 EGD  / Colonoscopy for iron deficiency anemia Complete exam, good prep. Decreased sphincter tone found on digital rectal exam. Diverticulosis in the left colon. One 15 mm polyp at the appendiceal orifice. Biopsied. Internal hemorrhoids.The examination was otherwise normal on direct and retroflexion views.   Normal esophagus. A single bleeding 4 mm angioectasia in the stomach. Injected. Treated with argon plasma coagulation (APC). Clip (MR conditional) was placed.. Normal examined duodenum.  Colon, polyp(s), polyp in appendiceal orifice - TUBULAR ADENOMA (X6 FRAGMENTS). - NO HIGH GRADE DYSPLASIA OR MALIGNANCY   Past Medical History:  Diagnosis Date  . Adrenal adenoma   . Arthritis   . Atypical atrial flutter ([place]) 8/15, 10/15   a. DCCV 08/2013. b. s/p RFA 10/2013.  [name] Basal cell carcinoma   . CAD (coronary artery disease)    a. 04/2013 CABG x 2: LIMA to LAD, SVG to RI, EVH via R thigh.  . Chronic diastolic congestive heart failure ([place])   . CKD (chronic kidney disease), stage III ([place])   . Depression   . Diabetes mellitus type II   . Diverticulosis 2001  . DJD (degenerative joint disease)   . GERD (gastroesophageal reflux  disease)   . Gout   . History of cardioversion    x3 (years uncertain)  . Hx of adenomatous colonic polyps   . Hyperlipidemia   . Hypertension   . Hypertensive cardiomyopathy ([place])   . Hypertensive retinopathy    OU  . Macular degeneration    OU  . Obstructive sleep apnea    compliant with CPAP  . 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 Persistent atrial fibrillation ([place])    a. s/p MAZE 04/2013 in setting of CABG. b. Amio stopped in 10/2013 after flutter ablation.  [name] PFO (patent foramen ovale)    a. Small PFO by TEE 10/2013.  [name] Pleural effusion, left    a. s/p thoracentesis 05/2013.  [name] Respiratory failure ([place])    a. Hypoxia 10/2013 - required supp O2 as inpatient, did not require it at discharge.  . S/P Maze operation for atrial fibrillation    a. 04/2013: Complete bilateral atrial lesion set using cryothermy and bipolar radiofrequency ablation with clipping of LA appendage (@ time of CABG)    Past Surgical History:  Procedure Laterality Date  . APPENDECTOMY  03/05/2017   laproscopic  . ATRIAL FIBRILLATION ABLATION N/A 10/26/2013   Procedure: ATRIAL FIBRILLATION ABLATION;  Surgeon: Coralyn Mark, MD;  Location: [facility] CATH LAB;  Service: Cardiovascular;  Laterality: N/A;  . BASAL CELL CARCINOMA EXCISION     x3 on face  . CARDIAC CATHETERIZATION     myocardial bridge but no cad  . CARDIOVERSION N/A 08/23/2013   Procedure: CARDIOVERSION;  Surgeon: Sanda Klein, MD;  Location: [place];  Service: Cardiovascular;  Laterality: N/A;  . CARPOMETACARPEL SUSPENSION PLASTY Left 02/14/2014   Procedure: CARPOMETACARPEL ([address]) SUSPENSIONPLASTY THUMB  WITH  ABDUCTOR POLLICIS LONGUS TRANSFER AND STENOSING TENOSYNOVITIS RELEASE LEFT WRIST;  Surgeon: Charlotte Crumb, MD;  Location: [place];  Service: Orthopedics;  Laterality: Left;  . CATARACT EXTRACTION Bilateral    . COLONOSCOPY WITH PROPOFOL N/A 01/26/2017   Procedure: COLONOSCOPY WITH PROPOFOL;  Surgeon: Doran Stabler, MD;  Location: [facility] ENDOSCOPY;  Service: Gastroenterology;  Laterality: N/A;  . CORONARY ARTERY BYPASS GRAFT N/A 04/05/2013   Procedure:  CORONARY ARTERY BYPASS GRAFTING (CABG) TIMES TWO USING LEFT INTERNAL MAMMARY ARTERY AND RIGHT SAPHENOUS LEG VEIN HARVESTED ENDOSCOPICALLY;  Surgeon: Rexene Alberts, MD;  Location: [place];  Service: Open Heart Surgery;  Laterality: N/A;  . ESOPHAGOGASTRODUODENOSCOPY (EGD) WITH PROPOFOL N/A 01/26/2017   Procedure: ESOPHAGOGASTRODUODENOSCOPY (EGD) WITH PROPOFOL;  Surgeon: Doran Stabler, MD;  Location: [facility]OS[facility];  Service: Gastroenterology;  Laterality: N/A;  . EYE SURGERY Bilateral    Cat Sx  . EYE SURGERY Right    RD repair - SB  . GREAT TOE ARTHRODESIS, INTERPHALANGEAL JOINT     Right foot  . INTRAOPERATIVE TRANSESOPHAGEAL ECHOCARDIOGRAM N/A 04/05/2013   Procedure: INTRAOPERATIVE TRANSESOPHAGEAL ECHOCARDIOGRAM;  Surgeon: Rexene Alberts, MD;  Location: [place];  Service: Open Heart Surgery;  Laterality: N/A;  . LAPAROSCOPIC APPENDECTOMY N/A 03/05/2017   Procedure: APPENDECTOMY LAPAROSCOPIC;  Surgeon: Judeth Horn, MD;  Location: [place];  Service: General;  Laterality: N/A;  . LEFT HEART CATHETERIZATION WITH CORONARY ANGIOGRAM N/A 03/07/2013   Procedure: LEFT HEART CATHETERIZATION WITH CORONARY ANGIOGRAM;  Surgeon: Burnell Blanks, MD;  Location: [facility] CATH LAB;  Service: Cardiovascular;  Laterality: N/A;  . MAZE N/A 04/05/2013   Procedure: MAZE;  Surgeon: Rexene Alberts, MD;  Location: [place];  Service: Open Heart Surgery;  Laterality: N/A;  . Polinydal cyst     Removed  . POLYPECTOMY    . Retina repair-right    . RETINAL DETACHMENT SURGERY Right    RD Repair - SB  . RIGHT HEART CATH N/A 07/06/2019   Procedure: RIGHT HEART CATH;  Surgeon: Jolaine Artist, MD;  Location: [place] CV LAB;  Service: Cardiovascular;  Laterality: N/A;  . RIGHT  HEART CATHETERIZATION N/A 05/03/2014   Procedure: RIGHT HEART CATH;  Surgeon: Jolaine Artist, MD;  Location: [facility] CATH LAB;  Service: Cardiovascular;  Laterality: N/A;  . TEE WITHOUT CARDIOVERSION N/A 08/23/2013   Procedure: TRANSESOPHAGEAL ECHOCARDIOGRAM (TEE);  Surgeon: Sanda Klein, MD;  Location: [facility] ENDOSCOPY;  Service: Cardiovascular;  Laterality: N/A;  . TEE WITHOUT CARDIOVERSION N/A 10/26/2013   Procedure: TRANSESOPHAGEAL ECHOCARDIOGRAM (TEE);  Surgeon: Sueanne Margarita, MD;  Location: [facility] ENDOSCOPY;  Service: Cardiovascular;  Laterality: N/A;  . TEE WITHOUT CARDIOVERSION N/A 06/05/2014   Procedure: TRANSESOPHAGEAL ECHOCARDIOGRAM (TEE);  Surgeon: Thayer Headings, MD;  Location: [place];  Service: Cardiovascular;  Laterality: N/A;  . TRAPEZIUM RESECTION      Prior to Admission medications   Medication Sig Start Date End Date Taking? Authorizing Provider  ACCU-CHEK AVIVA PLUS test strip CHECK BLOOD GLUCOSE 3 TIMES DAILY 11/26/19  Yes [name], MD  acetaminophen (TYLENOL) 500 MG tablet Take 1,000 mg by mouth every 6 (six) hours as needed for moderate pain or headache.    Yes [provider]  allopurinol (ZYLOPRIM) 300 MG tablet Take 300 mg by mouth daily.   Yes [provider]  ALPRAZolam Duanne Moron) 1 MG tablet Take 0.5 tablets (0.5 mg total) by mouth at bedtime as needed for sleep. 03/23/16  Yes [name], MD  Alum Hydroxide-Mag Carbonate (GAVISCON PO) Take 4 tablets by mouth daily as needed (acid reflux).    Yes [provider]  Ascorbic Acid (VITAMIN C) 1000 MG tablet Take 1,000 mg by mouth daily.   Yes [provider]  aspirin EC 81 MG tablet Take 81 mg by mouth daily.   Yes [provider]  atorvastatin (LIPITOR) 20 MG tablet Take  1 tablet  3 x /week  for Cholesterol Patient taking  differently: Take 10 mg by mouth 3 (three) times a week. Mon/ Wed/ Fri 04/21/20  Yes [name], MD  azelastine (ASTELIN) 0.1 % nasal spray  Place 2 sprays into both nostrils 2 (two) times daily as needed for rhinitis or allergies. 04/30/20  Yes [name], MD  B Complex Vitamins (VITAMIN B COMPLEX PO) Take 1 tablet by mouth at bedtime.    Yes [provider]  bacitracin 500 UNIT/GM ointment Apply 1 application topically daily as needed for wound care.   Yes [provider]  Blood Glucose Monitoring Suppl (ACCU-CHEK AVIVA PLUS) w/Device KIT Check blood sugar 1 time  daily 07/12/15  Yes [name], MD  Blood Glucose Monitoring Suppl DEVI Test blood sugar up to three times a day or as directed. 05/24/17  Yes Liane Comber, NP  carvedilol (COREG) 6.25 MG tablet Take 0.5 tablets (3.125 mg total) by mouth in the morning AND 1 tablet (6.25 mg total) every evening. 06/06/19  Yes Bensimhon, Shaune Pascal, MD  Cholecalciferol (VITAMIN D PO) Take 5,000 Units by mouth daily.    Yes [provider]  dapagliflozin propanediol (FARXIGA) 5 MG TABS tablet Take 1 tablet (5 mg total) by mouth daily before breakfast. 05/30/20  Yes Bensimhon, Shaune Pascal, MD  diphenhydrAMINE (BENADRYL) 25 MG tablet Take 25 mg by mouth at bedtime.   Yes [provider]  fluticasone (FLONASE) 50 MCG/ACT nasal spray Place 1 spray into both nostrils daily as needed for allergies. 03/14/20  Yes [name], MD  insulin NPH-regular Human (70-30) 100 UNIT/ML injection Inject 45 Units into the skin daily with breakfast. 35 units in the evening   Yes [provider]  loratadine (CLARITIN) 10 MG tablet Take 10 mg by mouth daily as needed for allergies.    Yes [provider]  potassium chloride SA (KLOR-CON) 20 MEQ tablet TAKE 2 TABLETS BY MOUTH 3  TIMES DAILY FOR POTASSIUM Patient taking differently: Take 60 mEq by mouth 2 (two) times daily. 03/16/20  Yes Liane Comber, NP  sertraline (ZOLOFT) 100 MG tablet TAKE 1 TABLET BY MOUTH  DAILY FOR MOOD 03/18/20  Yes Bensimhon, Shaune Pascal, MD  torsemide (DEMADEX) 100 MG tablet Take 150 mg  by mouth daily. 1.5 tablet   Yes [provider]  zinc gluconate 50 MG tablet Take 50 mg by mouth daily.   Yes [provider]  metolazone (ZAROXOLYN) 2.5 MG tablet Take 1 tablet (2.5 mg total) by mouth daily as needed (for swelling). Needs appt for further refills Patient not taking: Reported on 06/10/2020 04/01/20   Bensimhon, Shaune Pascal, MD  Multiple Vitamins-Minerals (PRESERVISION AREDS 2) CAPS Take 1 capsule by mouth 2 (two) times daily.     [provider]  polyvinyl alcohol (LIQUIFILM TEARS) 1.4 % ophthalmic solution Place 1 drop into both eyes daily as needed for dry eyes.    [provider]  warfarin (COUMADIN) 5 MG tablet Take    1.5 tablets /day      or as directed 12/11/19   [name], MD    Current Facility-Administered Medications  Medication Dose Route Frequency Provider Last Rate Last Admin  . 0.9 %  sodium chloride infusion (Manually program via Guardrails IV Fluids)   Intravenous Once Opyd, Ilene Qua, MD      . acetaminophen (TYLENOL) tablet 1,000 mg  1,000 mg Oral Q6H PRN Opyd, Ilene Qua, MD   1,000 mg at 06/10/20 0525   Or  . acetaminophen (TYLENOL) suppository 650 mg  650  mg Rectal Q6H PRN Opyd, Ilene Qua, MD      . insulin aspart (novoLOG) injection 0-9 Units  0-9 Units Subcutaneous Q4H Opyd, Timothy S, MD      . pantoprazole (PROTONIX) injection 40 mg  40 mg Intravenous Q12H Opyd, Ilene Qua, MD   40 mg at 06/10/20 0017    Allergies as of 06/09/2020 - Review Complete 06/09/2020  Allergen Reaction Noted  . Sunflower oil Swelling and Other (See Comments) 02/27/2013  . Horse-derived products Other (See Comments) 09/04/2010  . Immune globulin  09/29/2019  . Tetanus toxoids Other (See Comments) 05/01/2014  . Tetanus toxoid  09/06/2014    Family History  Problem Relation Age of Onset  . Dementia Father   . Colon cancer Mother        Family History/Uncle   . Colon polyps Mother        Family History  . Atrial fibrillation Mother    . Hypertension Mother   . Colon polyps Sister        Family history  . Diabetes Maternal Uncle   . Stroke Paternal Uncle     Social History   Socioeconomic History  . Marital status: Married    Spouse name: Not on file  . Number of children: 1  . Years of education: Not on file  . Highest education level: Not on file  Occupational History  . Occupation: retired Software engineer  Tobacco Use  . Smoking status: Former Smoker    Packs/day: 4.00    Years: 25.00    Pack years: 100.00    Types: Cigarettes    Quit date: 01/05/1981    Years since quitting: 39.4  . Smokeless tobacco: Never Used  Vaping Use  . Vaping Use: Never used  Substance and Sexual Activity  . Alcohol use: Yes    Alcohol/week: 1.0 standard drink    Types: 1 Shots of liquor per week    Comment: 1-5 drinks per week  . Drug use: No  . Sexual activity: Yes  Other Topics Concern  . Not on file  Social History Narrative   Daily caffeine-yes   Patient gets regular exercise.   Pt lives in [place] with spouse.  Retired Software engineer.  Social Determinants of Health   Financial Resource Strain: Not on file  Food Insecurity: Not on file  Transportation Needs: Not on file  Physical Activity: Not on file  Stress: Not on file  Social Connections: Not on file  Intimate Partner Violence: Not on file    Review of Systems: All systems reviewed and negative except where noted in HPI.  OBJECTIVE:    Physical Exam: Vital signs in last 24 hours: Temp:  [98 F (36.7 C)-98.4 F (36.9 C)] 98.4 F (36.9 C) (06/06 0850) Pulse Rate:  [54-75] 54 (06/06 0850) Resp:  [15-22] 16 (06/06 0850) BP: (118-134)/(51-88) 124/61 (06/06 0850) SpO2:  [94 %-100 %] 94 % (06/06 0850) Weight:  [83 kg] 83 kg (06/05 1546) Last BM Date: 06/09/20 General:   Alert  male in NAD in bedside recliner Psych:  Pleasant, cooperative. Normal mood and affect. Eyes:  Pupils equal, sclera clear, no icterus.   Conjunctiva pink. Ears:  Normal auditory acuity. Nose:  No deformity, discharge,  or lesions. Neck:  Supple; no masses Lungs:  Occasional wheeze in chest.  Heart:  Regular rate, no lower extremity edema Abdomen:  Soft, non-distended, nontender, BS active Rectal:  Deferred  Msk:  Symmetrical without gross deformities. . Neurologic:  Alert and  oriented x4;  grossly normal neurologically. Skin:  Intact without significant lesions or rashes.  Filed Weights   06/09/20 1546  Weight: 83 kg     Scheduled inpatient medications . sodium chloride   Intravenous Once  . insulin aspart  0-9 Units Subcutaneous Q4H  . pantoprazole (PROTONIX) IV  40 mg Intravenous Q12H      Intake/Output from previous day: 06/05 0701 - 06/06 0700 In: 240 [P.O.:240] Out: -  Intake/Output this shift: Total I/O In: 240 [P.O.:240] Out: -    Lab Results: Recent Labs     06/09/20 1601 06/10/20 0434  WBC 10.9*  --   HGB 7.8* 6.9*  HCT 24.5* 22.4*  PLT 275  --    BMET Recent Labs    06/09/20 1601 06/10/20 0434  NA 136 138  K 4.3 4.1  CL 102 104  CO2 24 25  GLUCOSE 115* 60*  BUN 42* 45*  CREATININE 1.87* 2.01*  CALCIUM 8.1* 8.4*   LFT Recent Labs    06/09/20 1601  PROT 6.3*  ALBUMIN 3.9  AST 16  ALT 15  ALKPHOS 111  BILITOT 0.4   PT/INR Recent Labs    06/09/20 1601 06/10/20 0434  LABPROT 54.0* 48.2*  INR 6.1* 5.3*   Hepatitis Panel No results for input(s): HEPBSAG, HCVAB, HEPAIGM, HEPBIGM in the last 72 hours.   . CBC Latest Ref Rng & Units 06/10/2020 06/09/2020 06/06/2020  WBC 4.0 - 10.5 K/uL - 10.9(H) 10.0  Hemoglobin 13.0 - 17.0 g/dL 6.9(LL) 7.8(L) 8.5(L)  Hematocrit 39.0 - 52.0 % 22.4(L) 24.5(L) 27.2(L)  Platelets 150 - 400 K/uL - 275 293    . CMP Latest Ref Rng & Units 06/10/2020 06/09/2020 06/06/2020  Glucose 70 - 99 mg/dL 60(L) 115(H) 99  BUN 8 - 23 mg/dL 45(H) 42(H) 50(H)  Creatinine 0.61 - 1.24 mg/dL 2.01(H) 1.87(H) 1.89(H)  Sodium 135 - 145 mmol/L 138 136 142  Potassium 3.5 - 5.1 mmol/L 4.1 4.3 3.9  Chloride 98 - 111 mmol/L 104 102 105  CO2 22 - 32 mmol/L 25 24 26   Calcium 8.9 - 10.3 mg/dL 8.4(L) 8.1(L) 8.6  Total Protein 6.5 - 8.1 g/dL - 6.3(L) 6.5  Total Bilirubin 0.3 - 1.2 mg/dL -  0.4 0.4  Alkaline Phos 38 - 126 U/L - 111 -  AST 15 - 41 U/L - 16 16  ALT 0 - 44 U/L - 15 16   Studies/Results: DG Chest Portable 1 View  Result Date: 06/09/2020 CLINICAL DATA:  Weakness EXAM: PORTABLE CHEST 1 VIEW COMPARISON:  Chest x-ray dated 02/06/2020. FINDINGS: Stable cardiomegaly. Median sternotomy wires appear intact and stable in alignment. Central pulmonary vascular congestion persists, likely chronic. Coarse lung markings bilaterally, LEFT greater than RIGHT, suggesting chronic interstitial lung disease/fibrosis. No new confluent opacity to suggest a developing pneumonia. No pleural effusion or pneumothorax is seen. IMPRESSION:  1. No active disease. No evidence of pneumonia or pulmonary edema. 2. Stable cardiomegaly.  Suspect chronic mild CHF. 3. 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 Electronically Signed   By: Franki Cabot M.D.   On: 06/09/2020 17:28    Principal Problem:   GI bleeding Active Problems:   PAF (paroxysmal atrial fibrillation) (HCC)   Chronic diastolic congestive heart failure (HCC)   Type 2 diabetes mellitus with stage 4 chronic kidney disease, with long-term current use of insulin (HCC)   CAD (coronary artery disease)   OSA on CPAP   CKD stage 3 due to type 2 diabetes mellitus (HCC)   Symptomatic anemia   Elevated INR   Prolonged QT interval   Blood loss anemia    Tye Savoy, NP-C @  06/10/2020, 9:37 AM

## 2020-06-10 NOTE — Progress Notes (Signed)
PROGRESS NOTE    [name]  CZY:[id number] DOB: [birth year] DOA: 06/09/2020 PCP: [name], MD    Brief Narrative:  Mr. Michiels was admitted to the hospital with the working diagnosis of symptomatic anemia, suspected acute upper GI bleed in the setting of vitamin K antagonist coagulopathy.   80 year old male past medical history for paroxysmal atrial fibrillation, chronic kidney disease stage IIIb, chronic diastolic heart failure, coronary artery disease, and sleep apnea who presented with melena and lightheadedness.  Patient reported 10 days of melanotic stools, associated with lightheadedness but no nausea or vomiting.  Patient underwent work-up as an outpatient with blood work, showing anemia and coagulopathy along with renal failure, he was referred to the hospital for further evaluation.  On his initial physical examination blood pressure 128/58, heart rate 68, respiratory rate 16, temperature 98, oxygen saturation 98% He had moist mucous membranes, his lungs were clear to auscultation bilaterally heart S1-S2, present, rhythmic, soft abdomen, no lower extremity edema.  Sodium 136, potassium 4.3, chloride 102, bicarb 24, glucose 115, BUN 42, creatinine 1.87, white count 10.9, hemoglobin 7.8, hematocrit 24.5, platelets 275. SARS COVID-19 negative.  Chest radiograph with cardiomegaly, bibasilar atelectasis.  EKG 78 bpm, right axis deviation, right bundle branch block, QTC 514, junctional rhythm, poor R wave progression, no significant ST segment or T wave changes.  Continue to have melena, hgb down to 6.9, ordered 1 unit PRBC transfusion and vitamin K to revert high INR.   Assessment & Plan:   Principal Problem:   GI bleeding Active Problems:   PAF (paroxysmal atrial fibrillation) (HCC)   Chronic diastolic congestive heart failure (HCC)   Type 2 diabetes mellitus with stage 4 chronic kidney disease, with long-term current use of insulin (HCC)   CAD (coronary artery disease)    OSA on CPAP   CKD stage 3 due to type 2 diabetes mellitus (HCC)   Symptomatic anemia   Elevated INR   Prolonged QT interval   Blood loss anemia   1. Upper GI bleed with acute blood loss anemia. 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   Patient continue to have melanotic stools. His Hgb has dropped to 6,9 and now sp 1 unit PRBC transfusion.  Patient has received Vitamin K, follow up INR is 5.3 from 6,1.  Plan to continue supportive care with IV pantoprazole and follow up H&h and INR.  Keep Hgb above 7.  Endoscopic intervention when INR is down to 2.  Continue with clear liquids for now.   2. Paroxysmal atrial fibrillation, prolonged QTc, chronic diastolic heart failure. Patient with no clinical signs of heart failure decompensations. Continue rate control and telemetry monitoring. Holding anticoagulation due to acute blood loss anemia.  Resume carvedilol, but continue to hold on torsemide.   3. CKD stage 3b. Continue close monitoring of renal function and electrolytes, avoid hypotension and nephrotoxic medications.   4. T2DM dyslipidemia. continue insulin sliding scale for glucose cover and monitoring, clear liquid diet for now.  Continue to hold on dapagliflozin for now along with basal insulin (patient at home on 70/30 insulin).   Continue with statin therapy.   5. Knee osteoarthritis. Add topical diclofenac to the right knee.   6. Depression. Continue with alprazolam and sertraline   Patient continue to be at high risk for worsening acute blood loss anemia.   Status is: Inpatient  Remains inpatient appropriate because:Inpatient level of care appropriate due to severity of illness   Dispo: The  patient is from: Home              Anticipated d/c is to: Home              Patient currently is not medically stable to d/c.   Difficult to place patient No   DVT prophylaxis: scd   Code Status:   full  Family Communication:  No family  at the bedside      Consultants:   GI   Subjective: Patient continue to have dark stools but less intensity compared to few days ago, no clots. No lightheadedness or dizziness.   Objective: Vitals:   06/10/20 0302 06/10/20 0850 06/10/20 1145 06/10/20 1205  BP: 129/71 124/61 (!) 143/76 136/72  Pulse: 66 (!) 54  74  Resp: 18 16 18 18   Temp: 98.2 F (36.8 C) 98.4 F (36.9 C) 97.9 F (36.6 C) (!) 97.4 F (36.3 C)  TempSrc: Oral Oral Oral Oral  SpO2: 98% 94% 97% 98%  Weight:      Height:        Intake/Output Summary (Last 24 hours) at 06/10/2020 1246 Last data filed at 06/10/2020 3536 Gross per 24 hour  Intake 480 ml  Output --  Net 480 ml   Filed Weights   06/09/20 1546  Weight: 83 kg    Examination:   General: Not in pain or dyspnea, deconditioned  Neurology: Awake and alert, non focal  E ENT: positive pallor, no icterus, oral mucosa moist Cardiovascular: No JVD. S1-S2 present, rhythmic, no gallops, rubs, or murmurs. No lower extremity edema. Pulmonary: positive breath sounds bilaterally, adequate air movement, no wheezing, rhonchi or rales. Gastrointestinal. Abdomen soft and non tender Skin. No rashes Musculoskeletal: no joint deformities     Data Reviewed: I have personally reviewed following labs and imaging studies  CBC: Recent Labs  Lab 06/06/20 1536 06/09/20 1601 06/10/20 0434  WBC 10.0 10.9*  --   NEUTROABS 8,220* 9.0*  --   HGB 8.5* 7.8* 6.9*  HCT 27.2* 24.5* 22.4*  MCV 92.8 94.6  --   PLT 293 275  --    Basic Metabolic Panel: Recent Labs  Lab 06/06/20 1536 06/09/20 1601 06/10/20 0434  NA 142 136 138  K 3.9 4.3 4.1  CL 105 102 104  CO2 26 24 25   GLUCOSE 99 115* 60*  BUN 50* 42* 45*  CREATININE 1.89* 1.87* 2.01*  CALCIUM 8.6 8.1* 8.4*  MG  --   --  2.8*   GFR: Estimated Creatinine Clearance: 30.7 mL/min (A) (by C-G formula based on SCr of 2.01 mg/dL (H)). Liver Function Tests: Recent Labs  Lab 06/06/20 1536 06/09/20 1601  AST  16 16  ALT 16 15  ALKPHOS  --  111  BILITOT 0.4 0.4  PROT 6.5 6.3*  ALBUMIN  --  3.9   No results for input(s): LIPASE, AMYLASE in the last 168 hours. No results for input(s): AMMONIA in the last 168 hours. Coagulation Profile: Recent Labs  Lab 06/06/20 1536 06/09/20 1601 06/10/20 0434  INR 4.8* 6.1* 5.3*   Cardiac Enzymes: No results for input(s): CKTOTAL, CKMB, CKMBINDEX, TROPONINI in the last 168 hours. BNP (last 3 results) No results for input(s): PROBNP in the last 8760 hours. HbA1C: No results for input(s): HGBA1C in the last 72 hours. CBG: Recent Labs  Lab 06/10/20 0427 06/10/20 0519 06/10/20 0744 06/10/20 0830 06/10/20 1200  GLUCAP 56* 93 64* 86 70   Lipid Profile: No results for input(s): CHOL, HDL, LDLCALC, TRIG,  CHOLHDL, LDLDIRECT in the last 72 hours. Thyroid Function Tests: No results for input(s): TSH, T4TOTAL, FREET4, T3FREE, THYROIDAB in the last 72 hours. Anemia Panel: No results for input(s): VITAMINB12, FOLATE, FERRITIN, TIBC, IRON, RETICCTPCT in the last 72 hours.    Radiology Studies: I have reviewed all of the imaging during this hospital visit personally     Scheduled Meds: . insulin aspart  0-9 Units Subcutaneous Q4H  . pantoprazole (PROTONIX) IV  40 mg Intravenous Q12H   Continuous Infusions:   LOS: 0 days        Luvern Mischke Gerome Apley, MD

## 2020-06-10 NOTE — Plan of Care (Signed)
  Problem: Education: Goal: Knowledge of General Education information will improve Description: Including pain rating scale, medication(s)/side effects and non-pharmacologic comfort measures Outcome: Progressing   Problem: Activity: Goal: Risk for activity intolerance will decrease Outcome: Progressing   Problem: Nutrition: Goal: Adequate nutrition will be maintained Outcome: Progressing   Problem: Coping: Goal: Level of anxiety will decrease Outcome: Progressing   Problem: Clinical Measurements: Goal: Diagnostic test results will improve Outcome: Not Progressing

## 2020-06-10 NOTE — ED Notes (Signed)
[facility] has arrived for patient 

## 2020-06-10 NOTE — Progress Notes (Signed)
   06/10/20 0600  Provider Notification  Provider Name/Title Mitzi Hansen, MD  Date Provider Notified 06/10/20  Time Provider Notified 0600  Notification Type Page  Notification Reason Critical result  Test performed and critical result Hgb = 6.9 ; INR= 5.3  Date Critical Result Received 06/10/20  Time Critical Result Received 5072  Provider response See new orders  Date of Provider Response 06/10/20  Time of Provider Response [phone]

## 2020-06-11 ENCOUNTER — Telehealth: Payer: Self-pay | Admitting: Nutrition

## 2020-06-11 ENCOUNTER — Inpatient Hospital Stay: Payer: Medicare Other

## 2020-06-11 ENCOUNTER — Inpatient Hospital Stay: Payer: Medicare Other | Admitting: Physician Assistant

## 2020-06-11 ENCOUNTER — Ambulatory Visit: Payer: Medicare Other | Admitting: Hematology

## 2020-06-11 DIAGNOSIS — K921 Melena: Secondary | ICD-10-CM

## 2020-06-11 LAB — BASIC METABOLIC PANEL
Anion gap: 9 (ref 5–15)
BUN: 40 mg/dL — ABNORMAL HIGH (ref 8–23)
CO2: 23 mmol/L (ref 22–32)
Calcium: 8.4 mg/dL — ABNORMAL LOW (ref 8.9–10.3)
Chloride: 104 mmol/L (ref 98–111)
Creatinine, Ser: 1.94 mg/dL — ABNORMAL HIGH (ref 0.61–1.24)
GFR, Estimated: 34 mL/min — ABNORMAL LOW (ref >60)
Glucose, Bld: 115 mg/dL — ABNORMAL HIGH (ref 70–99)
Potassium: 3.8 mmol/L (ref 3.5–5.1)
Sodium: 136 mmol/L (ref 135–145)

## 2020-06-11 LAB — GLUCOSE, CAPILLARY
Glucose-Capillary: 109 mg/dL — ABNORMAL HIGH (ref 70–99)
Glucose-Capillary: 135 mg/dL — ABNORMAL HIGH (ref 70–99)
Glucose-Capillary: 158 mg/dL — ABNORMAL HIGH (ref 70–99)
Glucose-Capillary: 201 mg/dL — ABNORMAL HIGH (ref 70–99)
Glucose-Capillary: 213 mg/dL — ABNORMAL HIGH (ref 70–99)
Glucose-Capillary: 251 mg/dL — ABNORMAL HIGH (ref 70–99)
Glucose-Capillary: 95 mg/dL (ref 70–99)

## 2020-06-11 LAB — CBC WITH DIFFERENTIAL/PLATELET
Abs Immature Granulocytes: 0.07 10*3/uL (ref 0.00–0.07)
Basophils Absolute: 0 10*3/uL (ref 0.0–0.1)
Basophils Relative: 0 %
Eosinophils Absolute: 0.1 10*3/uL (ref 0.0–0.5)
Eosinophils Relative: 1 %
HCT: 25.7 % — ABNORMAL LOW (ref 39.0–52.0)
Hemoglobin: 8 g/dL — ABNORMAL LOW (ref 13.0–17.0)
Immature Granulocytes: 1 %
Lymphocytes Relative: 8 %
Lymphs Abs: 0.9 10*3/uL (ref 0.7–4.0)
MCH: 28.8 pg (ref 26.0–34.0)
MCHC: 31.1 g/dL (ref 30.0–36.0)
MCV: 92.4 fL (ref 80.0–100.0)
Monocytes Absolute: 1.4 10*3/uL — ABNORMAL HIGH (ref 0.1–1.0)
Monocytes Relative: 12 %
Neutro Abs: 8.5 10*3/uL — ABNORMAL HIGH (ref 1.7–7.7)
Neutrophils Relative %: 78 %
Platelets: 239 10*3/uL (ref 150–400)
RBC: 2.78 MIL/uL — ABNORMAL LOW (ref 4.22–5.81)
RDW: 16.6 % — ABNORMAL HIGH (ref 11.5–15.5)
WBC: 10.9 10*3/uL — ABNORMAL HIGH (ref 4.0–10.5)
nRBC: 0.2 % (ref 0.0–0.2)

## 2020-06-11 LAB — PROTIME-INR
INR: 2.4 — ABNORMAL HIGH (ref 0.8–1.2)
Prothrombin Time: 26.4 seconds — ABNORMAL HIGH (ref 11.4–15.2)

## 2020-06-11 NOTE — Progress Notes (Signed)
Progress Note  Chief Complaint:    GI bleed     ASSESSMENT / PLAN:    # 80 yo male with multiple medical problems as listed below. Admitted with melena in setting of supratherapeutic INR ( 6.1). Hgb 6.9.. No overt bleeding over last few days. 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 He does have a history of iron deficiency anemia followed by Hematology and gets periodic IV iron  --Hemoglobin improved to 8 post one unit PRBC on 6/6.  --INR 2.4 , down from 6.1  --Continue BID IV PPI --He will need EGD with INR down to acceptable level ( hopefully tomorrow). Supportive care in the interim. Diet was reduced from solids to clears yesterday?  He can eat from GI standpoint. NPO after MN  # CKD IIIb  # Prolonged QTc, 513 ms in ED.   # Chronic diastolic heart failure  #  Diabetes, on insulin  # PAF, on coumadin at home.   # Hx of adenomatous colon polyps- 15 mm polyp at appendiceal orifice wa biopsied (tubular adenoma Jan 2019). He subsequently underwent extended appendectomy         SUBJECTIVE:   No complaints. No further melena. He is hungry, getting on clear liquids    OBJECTIVE:    Scheduled inpatient medications:  . allopurinol  300 mg Oral Daily  . atorvastatin  10 mg Oral Once per day on Mon Wed Fri  . carvedilol  6.25 mg Oral BID WC  . diclofenac Sodium  2 g Topical QID  . insulin aspart  0-9 Units Subcutaneous Q4H  . pantoprazole (PROTONIX) IV  40 mg Intravenous Q12H  . sertraline  100 mg Oral Daily   Continuous inpatient infusions:  PRN inpatient medications: acetaminophen **OR** acetaminophen, ALPRAZolam, fluticasone, loratadine, polyvinyl alcohol  Vital signs in last 24 hours: Temp:  [97.4 F (36.3 C)-98.3 F (36.8 C)] 98.3 F (36.8 C) (06/07 0556) Pulse Rate:  [71-74] 74 (06/07 0556) Resp:  [18-20] 20 (06/07 0556) BP: (100-143)/(66-76) 100/67 (06/07 0556) SpO2:  [94 %-100 %] 94 % (06/07 0556) Last BM Date:  06/10/20  Intake/Output Summary (Last 24 hours) at 06/11/2020 0915 Last data filed at 06/10/2020 2059 Gross per 24 hour  Intake 1350 ml  Output 400 ml  Net 950 ml     Physical Exam:  . General: Alert male in NAD . Heart:  Regular rate  . Pulmonary: Normal respiratory effort . Abdomen: Soft, nondistended, nontender. Normal bowel sounds.  . Neurologic: Alert and oriented . Psych: Pleasant. Cooperative.   Filed Weights   06/09/20 1546  Weight: 83 kg    Intake/Output from previous day: 06/06 0701 - 06/07 0700 In: 6213 [P.O.:1320; Blood:270] Out: 400 [Urine:400] Intake/Output this shift: No intake/output data recorded.    Lab Results: Recent Labs    06/09/20 1601 06/10/20 0434 06/10/20 1749 06/11/20 0442  WBC 10.9*  --   --  10.9*  HGB 7.8* 6.9* 8.3* 8.0*  HCT 24.5* 22.4* 27.1* 25.7*  PLT 275  --   --  239   BMET Recent Labs    06/09/20 1601 06/10/20 0434 06/11/20 0442  NA 136 138 136  K 4.3 4.1 3.8  CL 102 104 104  CO2 24 25 23   GLUCOSE 115* 60* 115*  BUN 42* 45* 40*  CREATININE 1.87* 2.01* 1.94*  CALCIUM 8.1* 8.4* 8.4*   LFT Recent Labs    06/09/20 1601  PROT 6.3*  ALBUMIN 3.9  AST 16  ALT  15  ALKPHOS 111  BILITOT 0.4   PT/INR Recent Labs    06/10/20 0434 06/11/20 0442  LABPROT 48.2* 26.4*  INR 5.3* 2.4*   Hepatitis Panel No results for input(s): HEPBSAG, HCVAB, HEPAIGM, HEPBIGM in the last 72 hours.  DG Chest Portable 1 View  Result Date: 06/09/2020 CLINICAL DATA:  Weakness EXAM: PORTABLE CHEST 1 VIEW COMPARISON:  Chest x-ray dated 02/06/2020. FINDINGS: Stable cardiomegaly. Median sternotomy wires appear intact and stable in alignment. Central pulmonary vascular congestion persists, likely chronic. Coarse lung markings bilaterally, LEFT greater than RIGHT, suggesting chronic interstitial lung disease/fibrosis. No new confluent opacity to suggest a developing pneumonia. No pleural effusion or pneumothorax is seen. IMPRESSION: 1. No active  disease. No evidence of pneumonia or pulmonary edema. 2. Stable cardiomegaly.  Suspect chronic mild CHF. 3. 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 Electronically Signed   By: Franki Cabot M.D.   On: 06/09/2020 17:28    Principal Problem:   GI bleeding Active Problems:   PAF (paroxysmal atrial fibrillation) (HCC)   Chronic diastolic congestive heart failure (HCC)   Type 2 diabetes mellitus with stage 4 chronic kidney disease, with long-term current use of insulin (HCC)   CAD (coronary artery disease)   OSA on CPAP   CKD stage 3 due to type 2 diabetes mellitus (HCC)   Symptomatic anemia   Elevated INR   Prolonged QT interval   Blood loss anemia     LOS: 1 day   Tye Savoy ,NP 06/11/2020, 9:15 AM

## 2020-06-11 NOTE — H&P (View-Only) (Signed)
Progress Note  Chief Complaint:    GI bleed     ASSESSMENT / PLAN:    # 80 yo male with multiple medical problems as listed below. Admitted with melena in setting of supratherapeutic INR ( 6.1). Hgb 6.9.. No overt bleeding over last few days. 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 He does have a history of iron deficiency anemia followed by Hematology and gets periodic IV iron  --Hemoglobin improved to 8 post one unit PRBC on 6/6.  --INR 2.4 , down from 6.1  --Continue BID IV PPI --He will need EGD with INR down to acceptable level ( hopefully tomorrow). Supportive care in the interim. Diet was reduced from solids to clears yesterday?  He can eat from GI standpoint. NPO after MN  # CKD IIIb  # Prolonged QTc, 513 ms in ED.   # Chronic diastolic heart failure  #  Diabetes, on insulin  # PAF, on coumadin at home.   # Hx of adenomatous colon polyps- 15 mm polyp at appendiceal orifice wa biopsied (tubular adenoma Jan 2019). He subsequently underwent extended appendectomy         SUBJECTIVE:   No complaints. No further melena. He is hungry, getting on clear liquids    OBJECTIVE:    Scheduled inpatient medications:  . allopurinol  300 mg Oral Daily  . atorvastatin  10 mg Oral Once per day on Mon Wed Fri  . carvedilol  6.25 mg Oral BID WC  . diclofenac Sodium  2 g Topical QID  . insulin aspart  0-9 Units Subcutaneous Q4H  . pantoprazole (PROTONIX) IV  40 mg Intravenous Q12H  . sertraline  100 mg Oral Daily   Continuous inpatient infusions:  PRN inpatient medications: acetaminophen **OR** acetaminophen, ALPRAZolam, fluticasone, loratadine, polyvinyl alcohol  Vital signs in last 24 hours: Temp:  [97.4 F (36.3 C)-98.3 F (36.8 C)] 98.3 F (36.8 C) (06/07 0556) Pulse Rate:  [71-74] 74 (06/07 0556) Resp:  [18-20] 20 (06/07 0556) BP: (100-143)/(66-76) 100/67 (06/07 0556) SpO2:  [94 %-100 %] 94 % (06/07 0556) Last BM Date:  06/10/20  Intake/Output Summary (Last 24 hours) at 06/11/2020 0915 Last data filed at 06/10/2020 2059 Gross per 24 hour  Intake 1350 ml  Output 400 ml  Net 950 ml     Physical Exam:  . General: Alert male in NAD . Heart:  Regular rate  . Pulmonary: Normal respiratory effort . Abdomen: Soft, nondistended, nontender. Normal bowel sounds.  . Neurologic: Alert and oriented . Psych: Pleasant. Cooperative.   Filed Weights   06/09/20 1546  Weight: 83 kg    Intake/Output from previous day: 06/06 0701 - 06/07 0700 In: 9323 [P.O.:1320; Blood:270] Out: 400 [Urine:400] Intake/Output this shift: No intake/output data recorded.    Lab Results: Recent Labs    06/09/20 1601 06/10/20 0434 06/10/20 1749 06/11/20 0442  WBC 10.9*  --   --  10.9*  HGB 7.8* 6.9* 8.3* 8.0*  HCT 24.5* 22.4* 27.1* 25.7*  PLT 275  --   --  239   BMET Recent Labs    06/09/20 1601 06/10/20 0434 06/11/20 0442  NA 136 138 136  K 4.3 4.1 3.8  CL 102 104 104  CO2 24 25 23   GLUCOSE 115* 60* 115*  BUN 42* 45* 40*  CREATININE 1.87* 2.01* 1.94*  CALCIUM 8.1* 8.4* 8.4*   LFT Recent Labs    06/09/20 1601  PROT 6.3*  ALBUMIN 3.9  AST 16  ALT  15  ALKPHOS 111  BILITOT 0.4   PT/INR Recent Labs    06/10/20 0434 06/11/20 0442  LABPROT 48.2* 26.4*  INR 5.3* 2.4*   Hepatitis Panel No results for input(s): HEPBSAG, HCVAB, HEPAIGM, HEPBIGM in the last 72 hours.  DG Chest Portable 1 View  Result Date: 06/09/2020 CLINICAL DATA:  Weakness EXAM: PORTABLE CHEST 1 VIEW COMPARISON:  Chest x-ray dated 02/06/2020. FINDINGS: Stable cardiomegaly. Median sternotomy wires appear intact and stable in alignment. Central pulmonary vascular congestion persists, likely chronic. Coarse lung markings bilaterally, LEFT greater than RIGHT, suggesting chronic interstitial lung disease/fibrosis. No new confluent opacity to suggest a developing pneumonia. No pleural effusion or pneumothorax is seen. IMPRESSION: 1. No active  disease. No evidence of pneumonia or pulmonary edema. 2. Stable cardiomegaly.  Suspect chronic mild CHF. 3. 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 Electronically Signed   By: Franki Cabot M.D.   On: 06/09/2020 17:28    Principal Problem:   GI bleeding Active Problems:   PAF (paroxysmal atrial fibrillation) (HCC)   Chronic diastolic congestive heart failure (HCC)   Type 2 diabetes mellitus with stage 4 chronic kidney disease, with long-term current use of insulin (HCC)   CAD (coronary artery disease)   OSA on CPAP   CKD stage 3 due to type 2 diabetes mellitus (HCC)   Symptomatic anemia   Elevated INR   Prolonged QT interval   Blood loss anemia     LOS: 1 day   Tye Savoy ,NP 06/11/2020, 9:15 AM

## 2020-06-11 NOTE — Progress Notes (Signed)
PROGRESS NOTE    [name]  XBD:[id number] DOB: 01/14/1940 DOA: 06/09/2020 PCP: [name], MD    Brief Narrative:  Mr. Domangue was admitted to the hospital with the working diagnosis of symptomatic anemia, suspected acute upper GI bleed in the setting of vitamin K antagonist coagulopathy.   80 year old male past medical history for paroxysmal atrial fibrillation, chronic kidney disease stage IIIb, chronic diastolic heart failure, coronary artery disease, and sleep apnea who presented with melena and lightheadedness.  Patient reported 10 days of melanotic stools, associated with lightheadedness but no nausea or vomiting.  Patient underwent work-up as an outpatient with blood work, showing anemia and coagulopathy along with renal failure, he was referred to the hospital for further evaluation.  On his initial physical examination blood pressure 128/58, heart rate 68, respiratory rate 16, temperature 98, oxygen saturation 98% He had moist mucous membranes, his lungs were clear to auscultation bilaterally heart S1-S2, present, rhythmic, soft abdomen, no lower extremity edema.  Sodium 136, potassium 4.3, chloride 102, bicarb 24, glucose 115, BUN 42, creatinine 1.87, white count 10.9, hemoglobin 7.8, hematocrit 24.5, platelets 275. SARS COVID-19 negative.  Chest radiograph with cardiomegaly, bibasilar atelectasis.  EKG 78 bpm, right axis deviation, right bundle branch block, QTC 514, junctional rhythm, poor R wave progression, no significant ST segment or T wave changes.  Continue to have melena, hgb down to 6.9, ordered 1 unit PRBC transfusion and vitamin K to revert high INR.   Post transfusion Hgb up to 8.0 and after vitamin K INR 2,4. Plan for EGD 06/08.    Assessment & Plan:   Principal Problem:   GI bleeding Active Problems:   PAF (paroxysmal atrial fibrillation) (HCC)   Chronic diastolic congestive heart failure (HCC)   Type 2 diabetes mellitus with stage 4 chronic  kidney disease, with long-term current use of insulin (HCC)   CAD (coronary artery disease)   OSA on CPAP   CKD stage 3 due to type 2 diabetes mellitus (HCC)   Symptomatic anemia   Elevated INR   Prolonged QT interval   Blood loss anemia     1. Upper GI bleed with acute blood loss anemia. 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  sp 1 unit PRBC transfusion.  No further melanotic stools. His Hgb is stable at 8,0 and INR is 2.4.   Patient allowed to have regular diet today and will be NPO after midnight for upper endoscopy in am. Continue antiacid therapy with IV pantoprazole and follow cell count in am.  Holding warfarin.   2. Paroxysmal atrial fibrillation, prolonged QTc, chronic diastolic heart failure. No signs of heart failure decompensations. Continue rate control and heart failure management with carvedilol. Holding torsemide for now.  3. CKD stage 3b. Renal function stable with serum cr at 1,94 with AK at 3,8 and serum bicarbonate at 23. Continue close follow up on renal function and electrolytes.   4. T2DM dyslipidemia.   Fasting glucose is 115, will continue with insulin sliding scale for glucose cover and monitoring.  At home on dapagliflozin and 70/30 insulin.    On statin therapy.   5. Knee osteoarthritis. Continue with topical diclofenac to the right knee.   6. Depression. On alprazolam and sertraline   Patient continue to be at high risk for worsening anemia   Status is: Inpatient  Remains inpatient appropriate because:Inpatient level of care appropriate due to severity of illness   Dispo: The patient is from: Home  Anticipated d/c is to: Home              Patient currently is not medically stable to d/c.   Difficult to place patient No   DVT prophylaxis: scd   Code Status:   full  Family Communication:  No family at the bedside      Consultants:   GI    Subjective: Patient is feeling  better, continue to have dark stools but no frank melena, no hematochezia, no nausea or vomiting, no chest pain or dyspnea.   Objective: Vitals:   06/10/20 1205 06/10/20 1419 06/11/20 0556 06/11/20 1543  BP: 136/72 116/66 100/67 115/71  Pulse: 74 71 74 73  Resp: 18  20 18   Temp: (!) 97.4 F (36.3 C) 98 F (36.7 C) 98.3 F (36.8 C) (!) 97.5 F (36.4 C)  TempSrc: Oral Oral Oral Oral  SpO2: 98% 100% 94% 94%  Weight:      Height:        Intake/Output Summary (Last 24 hours) at 06/11/2020 1609 Last data filed at 06/11/2020 0900 Gross per 24 hour  Intake 1160 ml  Output 600 ml  Net 560 ml   Filed Weights   06/09/20 1546  Weight: 83 kg    Examination:   General: Not in pain or dyspnea, deconditioned  Neurology: Awake and alert, non focal  E ENT: positive pallor, no icterus, oral mucosa moist Cardiovascular: No JVD. S1-S2 present, rhythmic, no gallops, rubs, or murmurs. No lower extremity edema. Pulmonary: positive breath sounds bilaterally, with no wheezing, rhonchi or rales. Gastrointestinal. Abdomen soft and non tender Skin. No rashes Musculoskeletal: no joint deformities     Data Reviewed: I have personally reviewed following labs and imaging studies  CBC: Recent Labs  Lab 06/06/20 1536 06/09/20 1601 06/10/20 0434 06/10/20 1749 06/11/20 0442  WBC 10.0 10.9*  --   --  10.9*  NEUTROABS 8,220* 9.0*  --   --  8.5*  HGB 8.5* 7.8* 6.9* 8.3* 8.0*  HCT 27.2* 24.5* 22.4* 27.1* 25.7*  MCV 92.8 94.6  --   --  92.4  PLT 293 275  --   --  093   Basic Metabolic Panel: Recent Labs  Lab 06/06/20 1536 06/09/20 1601 06/10/20 0434 06/11/20 0442  NA 142 136 138 136  K 3.9 4.3 4.1 3.8  CL 105 102 104 104  CO2 26 24 25 23   GLUCOSE 99 115* 60* 115*  BUN 50* 42* 45* 40*  CREATININE 1.89* 1.87* 2.01* 1.94*  CALCIUM 8.6 8.1* 8.4* 8.4*  MG  --   --  2.8*  --    GFR: Estimated Creatinine Clearance: 31.8 mL/min (A) (by C-G formula based on SCr of 1.94 mg/dL (H)). Liver  Function Tests: Recent Labs  Lab 06/06/20 1536 06/09/20 1601  AST 16 16  ALT 16 15  ALKPHOS  --  111  BILITOT 0.4 0.4  PROT 6.5 6.3*  ALBUMIN  --  3.9   No results for input(s): LIPASE, AMYLASE in the last 168 hours. No results for input(s): AMMONIA in the last 168 hours. Coagulation Profile: Recent Labs  Lab 06/06/20 1536 06/09/20 1601 06/10/20 0434 06/11/20 0442  INR 4.8* 6.1* 5.3* 2.4*   Cardiac Enzymes: No results for input(s): CKTOTAL, CKMB, CKMBINDEX, TROPONINI in the last 168 hours. BNP (last 3 results) No results for input(s): PROBNP in the last 8760 hours. HbA1C: Recent Labs    06/10/20 0434  HGBA1C 7.0*   CBG: Recent Labs  Lab 06/10/20  1957 06/11/20 0025 06/11/20 0401 06/11/20 0723 06/11/20 1157  GLUCAP 162* 158* 109* 95 213*   Lipid Profile: No results for input(s): CHOL, HDL, LDLCALC, TRIG, CHOLHDL, LDLDIRECT in the last 72 hours. Thyroid Function Tests: No results for input(s): TSH, T4TOTAL, FREET4, T3FREE, THYROIDAB in the last 72 hours. Anemia Panel: No results for input(s): VITAMINB12, FOLATE, FERRITIN, TIBC, IRON, RETICCTPCT in the last 72 hours.    Radiology Studies: I have reviewed all of the imaging during this hospital visit personally     Scheduled Meds: . allopurinol  300 mg Oral Daily  . atorvastatin  10 mg Oral Once per day on Mon Wed Fri  . carvedilol  6.25 mg Oral BID WC  . diclofenac Sodium  2 g Topical QID  . insulin aspart  0-9 Units Subcutaneous Q4H  . pantoprazole (PROTONIX) IV  40 mg Intravenous Q12H  . sertraline  100 mg Oral Daily   Continuous Infusions:   LOS: 1 day        Maggie Senseney Gerome Apley, MD

## 2020-06-11 NOTE — Telephone Encounter (Signed)
Rescheduled appointment per 06/07 sch msg. Left message.

## 2020-06-12 ENCOUNTER — Inpatient Hospital Stay (HOSPITAL_COMMUNITY): Payer: Medicare Other | Admitting: Certified Registered Nurse Anesthetist

## 2020-06-12 ENCOUNTER — Encounter (HOSPITAL_COMMUNITY): Payer: Self-pay | Admitting: Internal Medicine

## 2020-06-12 ENCOUNTER — Encounter (HOSPITAL_COMMUNITY)
Admission: EM | Disposition: A | Discharge status: Home / Self Care | Payer: Self-pay | Source: Home / Self Care | Attending: Internal Medicine

## 2020-06-12 DIAGNOSIS — K2970 Gastritis, unspecified, without bleeding: Secondary | ICD-10-CM

## 2020-06-12 DIAGNOSIS — K31819 Angiodysplasia of stomach and duodenum without bleeding: Secondary | ICD-10-CM

## 2020-06-12 DIAGNOSIS — K921 Melena: Secondary | ICD-10-CM

## 2020-06-12 DIAGNOSIS — K5520 Angiodysplasia of colon without hemorrhage: Secondary | ICD-10-CM

## 2020-06-12 HISTORY — PX: BIOPSY: SHX5522

## 2020-06-12 HISTORY — PX: ENTEROSCOPY: SHX5533

## 2020-06-12 HISTORY — PX: HOT HEMOSTASIS: SHX5433

## 2020-06-12 LAB — BASIC METABOLIC PANEL
Anion gap: 9 (ref 5–15)
BUN: 46 mg/dL — ABNORMAL HIGH (ref 8–23)
CO2: 23 mmol/L (ref 22–32)
Calcium: 8.6 mg/dL — ABNORMAL LOW (ref 8.9–10.3)
Chloride: 103 mmol/L (ref 98–111)
Creatinine, Ser: 2.21 mg/dL — ABNORMAL HIGH (ref 0.61–1.24)
GFR, Estimated: 29 mL/min — ABNORMAL LOW (ref >60)
Glucose, Bld: 143 mg/dL — ABNORMAL HIGH (ref 70–99)
Potassium: 3.7 mmol/L (ref 3.5–5.1)
Sodium: 135 mmol/L (ref 135–145)

## 2020-06-12 LAB — CBC WITH DIFFERENTIAL/PLATELET
Abs Immature Granulocytes: 0.04 10*3/uL (ref 0.00–0.07)
Basophils Absolute: 0 10*3/uL (ref 0.0–0.1)
Basophils Relative: 0 %
Eosinophils Absolute: 0.3 10*3/uL (ref 0.0–0.5)
Eosinophils Relative: 3 %
HCT: 24.3 % — ABNORMAL LOW (ref 39.0–52.0)
Hemoglobin: 7.4 g/dL — ABNORMAL LOW (ref 13.0–17.0)
Immature Granulocytes: 0 %
Lymphocytes Relative: 9 %
Lymphs Abs: 0.9 10*3/uL (ref 0.7–4.0)
MCH: 28.5 pg (ref 26.0–34.0)
MCHC: 30.5 g/dL (ref 30.0–36.0)
MCV: 93.5 fL (ref 80.0–100.0)
Monocytes Absolute: 1.1 10*3/uL — ABNORMAL HIGH (ref 0.1–1.0)
Monocytes Relative: 11 %
Neutro Abs: 7.1 10*3/uL (ref 1.7–7.7)
Neutrophils Relative %: 77 %
Platelets: 219 10*3/uL (ref 150–400)
RBC: 2.6 MIL/uL — ABNORMAL LOW (ref 4.22–5.81)
RDW: 16.5 % — ABNORMAL HIGH (ref 11.5–15.5)
WBC: 9.4 10*3/uL (ref 4.0–10.5)
nRBC: 0.2 % (ref 0.0–0.2)

## 2020-06-12 LAB — GLUCOSE, CAPILLARY
Glucose-Capillary: 120 mg/dL — ABNORMAL HIGH (ref 70–99)
Glucose-Capillary: 144 mg/dL — ABNORMAL HIGH (ref 70–99)
Glucose-Capillary: 151 mg/dL — ABNORMAL HIGH (ref 70–99)
Glucose-Capillary: 159 mg/dL — ABNORMAL HIGH (ref 70–99)
Glucose-Capillary: 163 mg/dL — ABNORMAL HIGH (ref 70–99)
Glucose-Capillary: 211 mg/dL — ABNORMAL HIGH (ref 70–99)

## 2020-06-12 LAB — PROTIME-INR
INR: 1.8 — ABNORMAL HIGH (ref 0.8–1.2)
Prothrombin Time: 20.9 seconds — ABNORMAL HIGH (ref 11.4–15.2)

## 2020-06-12 LAB — PREPARE RBC (CROSSMATCH)

## 2020-06-12 SURGERY — EGD, WITH ARGON PLASMA COAGULATION
Anesthesia: Monitor Anesthesia Care

## 2020-06-12 MED ORDER — WARFARIN SODIUM 5 MG PO TABS
5.0000 mg | ORAL_TABLET | ORAL | Status: AC
  Administered 2020-06-12: 5 mg via ORAL
  Filled 2020-06-12: qty 1

## 2020-06-12 MED ORDER — PANTOPRAZOLE SODIUM 40 MG PO TBEC
40.0000 mg | DELAYED_RELEASE_TABLET | Freq: Two times a day (BID) | ORAL | Status: DC
  Administered 2020-06-12 – 2020-06-13 (×2): 40 mg via ORAL
  Filled 2020-06-12 (×2): qty 1

## 2020-06-12 MED ORDER — INSULIN ASPART 100 UNIT/ML IJ SOLN
0.0000 [IU] | Freq: Three times a day (TID) | INTRAMUSCULAR | Status: DC
  Administered 2020-06-13: 5 [IU] via SUBCUTANEOUS
  Administered 2020-06-13: 2 [IU] via SUBCUTANEOUS

## 2020-06-12 MED ORDER — INSULIN ASPART 100 UNIT/ML IJ SOLN: 0.0000 [IU] | Freq: Every day | INTRAMUSCULAR | Status: DC

## 2020-06-12 MED ORDER — WARFARIN - PHARMACIST DOSING INPATIENT: Freq: Every day | Status: DC

## 2020-06-12 MED ORDER — SODIUM CHLORIDE 0.9% IV SOLUTION: Freq: Once | INTRAVENOUS | Status: AC

## 2020-06-12 MED ORDER — SODIUM CHLORIDE 0.9 % IV SOLN: INTRAVENOUS | Status: DC

## 2020-06-12 MED ORDER — PROPOFOL 10 MG/ML IV BOLUS
INTRAVENOUS | Status: DC | PRN
  Administered 2020-06-12: 20 mg via INTRAVENOUS

## 2020-06-12 MED ORDER — PROPOFOL 500 MG/50ML IV EMUL
INTRAVENOUS | Status: DC | PRN
  Administered 2020-06-12: 125 ug/kg/min via INTRAVENOUS

## 2020-06-12 SURGICAL SUPPLY — 15 items

## 2020-06-12 NOTE — Transfer of Care (Signed)
Immediate Anesthesia Transfer of Care Note  Patient: Dayron Odland.  Procedure(s) Performed: ESOPHAGOGASTRODUODENOSCOPY (EGD) WITH PROPOFOL (N/A ) HOT HEMOSTASIS (ARGON PLASMA COAGULATION/BICAP) (N/A ) BIOPSY  Patient Location: PACU and Endoscopy Unit  Anesthesia Type:MAC  Level of Consciousness: awake, drowsy and responds to stimulation  Airway & Oxygen Therapy: Patient Spontanous Breathing and Patient connected to face mask oxygen  Post-op Assessment: Report given to RN and Post -op Vital signs reviewed and stable  Post vital signs: Reviewed and stable  Last Vitals:  Vitals Value Taken Time  BP    Temp    Pulse 64 06/12/20 1433  Resp 18 06/12/20 1433  SpO2 100 % 06/12/20 1433  Vitals shown include unvalidated device data.  Last Pain:  Vitals:   06/12/20 1303  TempSrc: Oral  PainSc: 0-No pain      Patients Stated Pain Goal: 0 (32/35/57 3220)  Complications: No complications documented.

## 2020-06-12 NOTE — Procedures (Addendum)
I attempted to reach the patient's wife by phone on the 2 numbers listed.  I was unable to reach her.  I will try again later.

## 2020-06-12 NOTE — Anesthesia Postprocedure Evaluation (Signed)
Anesthesia Post Note  Patient: [name].  Procedure(s) Performed: ESOPHAGOGASTRODUODENOSCOPY (EGD) WITH PROPOFOL (N/A ) HOT HEMOSTASIS (ARGON PLASMA COAGULATION/BICAP) (N/A ) BIOPSY     Patient location during evaluation: PACU Anesthesia Type: MAC Level of consciousness: awake and alert Pain management: pain level controlled Vital Signs Assessment: post-procedure vital signs reviewed and stable Respiratory status: spontaneous breathing, nonlabored ventilation and respiratory function stable Cardiovascular status: stable and blood pressure returned to baseline Postop Assessment: no apparent nausea or vomiting Anesthetic complications: no   No complications documented.  Last Vitals:  Vitals:   06/12/20 1440 06/12/20 1450  BP: 106/73 126/75  Pulse:  67  Resp: (!) 28 18  Temp:    SpO2:  92%    Last Pain:  Vitals:   06/12/20 1450  TempSrc:   PainSc: 0-No pain                 Merlinda Frederick

## 2020-06-12 NOTE — Progress Notes (Signed)
ANTICOAGULATION CONSULT NOTE - Initial Consult  Pharmacy Consult for Warfarin Indication: atrial fibrillation  Allergies  Allergen Reactions  . Sunflower Oil Swelling and Other (See Comments)    Tongue and lip swelling Other reaction(s): Other Tongue and lip swelling  . Horse-Derived Products Other (See Comments)    Per allergy skin test UNSPECIFIED REACTION  Other reaction(s): Other Per allergy skin test UNSPECIFIED REACTION   . Immune Globulin   . Tetanus Toxoids Other (See Comments)    Per allergy skin test  . Tetanus Toxoid     Other reaction(s): Other (See Comments) Rash(horse serum) Other reaction(s): Other Rash(horse serum)    Patient Measurements: Height: 5\' 11"  (180.3 cm) Weight: 83 kg (182 lb 15.7 oz) IBW/kg (Calculated) : 75.3  Vital Signs: Temp: 97.8 F (36.6 C) (06/08 1534) Temp Source: Oral (06/08 1534) BP: 143/82 (06/08 1534) Pulse Rate: 67 (06/08 1534)  Labs: Recent Labs    06/10/20 0434 06/10/20 1749 06/11/20 0442 06/12/20 0537  HGB 6.9* 8.3* 8.0* 7.4*  HCT 22.4* 27.1* 25.7* 24.3*  PLT  --   --  239 219  LABPROT 48.2*  --  26.4* 20.9*  INR 5.3*  --  2.4* 1.8*  CREATININE 2.01*  --  1.94* 2.21*    Estimated Creatinine Clearance: 27.9 mL/min (A) (by C-G formula based on SCr of 2.21 mg/dL (H)).   Medical History: Past Medical History:  Diagnosis Date  . Adrenal adenoma   . Arthritis   . Atypical atrial flutter ([place]) 8/15, 10/15   a. DCCV 08/2013. b. s/p RFA 10/2013.  [name] Basal cell carcinoma   . CAD (coronary artery disease)    a. 04/2013 CABG x 2: LIMA to LAD, SVG to RI, EVH via R thigh.  . Chronic diastolic congestive heart failure ([place])   . CKD (chronic kidney disease), stage III ([place])   . Depression   . Diabetes mellitus type II   . Diverticulosis 2001  . DJD (degenerative joint disease)   . GERD (gastroesophageal reflux disease)   . Gout   . History of cardioversion    x3 (years uncertain)  . Hx of adenomatous colonic polyps    . Hyperlipidemia   . Hypertension   . Hypertensive cardiomyopathy ([place])   . Hypertensive retinopathy    OU  . Macular degeneration    OU  . Obstructive sleep apnea    compliant with CPAP  . 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 Persistent atrial fibrillation ([place])    a. s/p MAZE 04/2013 in setting of CABG. b. Amio stopped in 10/2013 after flutter ablation.  [name] PFO (patent foramen ovale)    a. Small PFO by TEE 10/2013.  [name] Pleural effusion, left    a. s/p thoracentesis 05/2013.  [name] Respiratory failure ([place])    a. Hypoxia 10/2013 - required supp O2 as inpatient, did not require it at discharge.  . S/P Maze operation for atrial fibrillation    a. 04/2013: Complete bilateral atrial lesion set using cryothermy and bipolar radiofrequency ablation with clipping of LA appendage (@ time of CABG)    Medications:  Scheduled:  . sodium chloride   Intravenous Once  . allopurinol  300 mg Oral Daily  . atorvastatin  10 mg Oral Once per day on Mon Wed Fri  . carvedilol  6.25 mg Oral BID WC  . diclofenac Sodium  2 g Topical QID  . [START  ON 06/13/2020] insulin aspart  0-15 Units Subcutaneous TID WC  . insulin aspart  0-5 Units Subcutaneous QHS  . pantoprazole  40 mg Oral BID AC  . sertraline  100 mg Oral Daily   Infusions:  . sodium chloride 10 mL/hr at 06/12/20 1321   PRN: acetaminophen **OR** acetaminophen, ALPRAZolam, fluticasone, loratadine, polyvinyl alcohol  Assessment: 80 yo male on chronic warfarin for afib - home dose reported as 7.5mg  daily - last dose taken 6/5 PTA.  Admitted with GIB s/p EGD which showed esophageal candidiasis, gastritis and 2 nonbleeding angiectasia's in the stomach that were treated with APC.  Per GI, restart anticoagulation and follow hemoglobin. INR on admit supratherapeutic, given vitamin K 2.5mg  on 6/5.  INR today 1.8  Goal of Therapy:  INR 2-3    Plan:  Warfarin 5mg  PO x 1 tonight Daily PT/INR Will likely need to discharge on a lower maintenance warfarin regimen  Peggyann Juba, PharmD, BCPS Pharmacy: [phone] 06/12/2020,6:24 PM

## 2020-06-12 NOTE — Anesthesia Preprocedure Evaluation (Addendum)
Anesthesia Evaluation  Patient identified by MRN, date of birth, ID band Patient awake    Reviewed: Allergy & Precautions, NPO status , Patient's Chart, lab work & pertinent test results  Airway Mallampati: III  TM Distance: >3 FB Neck ROM: Full    Dental no notable dental hx.    Pulmonary sleep apnea and Continuous Positive Airway Pressure Ventilation , former smoker,    Pulmonary exam normal breath sounds clear to auscultation       Cardiovascular hypertension, Pt. on home beta blockers + CAD, + CABG, + Peripheral Vascular Disease and +CHF  Normal cardiovascular exam+ dysrhythmias Atrial Fibrillation  Rhythm:Regular Rate:Normal  ECG: rate 78  ECHO: 1. Reviewed patients previous TTE 12/16/16 and TEE from 2016 He has a known anomalous PV draining into SVC and fairly large PFO that was not well documented on this exam. Image 39 shows left to right color flow  cannot r/o small membranous VSD will discuss with Dr Jeffie Pollock.  2. RV appears more dilated and hypkinetic compared to 2018.  3. D shaped septum indicative of RV volume overload . Left ventricular ejection fraction, by estimation, is 60 to 65%. The left ventricle has normal function. The left ventricle has no regional wall motion abnormalities. There is moderate left ventricular hypertrophy. Left ventricular diastolic parameters are indeterminate.  4. Right ventricular systolic function is severely reduced. The right ventricular size is severely enlarged. There is moderately elevated pulmonary artery systolic pressure.  5. Left atrial size was severely dilated.  6. Right atrial size was severely dilated.  7. The mitral valve is normal in structure and function. Mild mitral valve regurgitation.  8. Tricuspid valve regurgitation is moderate.  9. The aortic valve is tricuspid. Aortic valve regurgitation is trivial.  Mild aortic valve sclerosis is present, with no evidence of aortic  valve stenosis.    Neuro/Psych PSYCHIATRIC DISORDERS Depression negative neurological ROS     GI/Hepatic negative GI ROS, Neg liver ROS,   Endo/Other  diabetes, Insulin Dependent  Renal/GU CRFRenal disease     Musculoskeletal  (+) Arthritis , Gout   Abdominal   Peds  Hematology  (+) Blood dyscrasia, anemia , HLD   Anesthesia Other Findings Upper gastrointestinal bleeding  Reproductive/Obstetrics                           Anesthesia Physical Anesthesia Plan  ASA: III  Anesthesia Plan: MAC   Post-op Pain Management:    Induction: Intravenous  PONV Risk Score and Plan: 1 and Propofol infusion and Treatment may vary due to age or medical condition  Airway Management Planned: Nasal Cannula  Additional Equipment:   Intra-op Plan:   Post-operative Plan:   Informed Consent: I have reviewed the patients History and Physical, chart, labs and discussed the procedure including the risks, benefits and alternatives for the proposed anesthesia with the patient or authorized representative who has indicated his/her understanding and acceptance.     Dental advisory given  Plan Discussed with: CRNA  Anesthesia Plan Comments:         Anesthesia Quick Evaluation

## 2020-06-12 NOTE — Progress Notes (Signed)
PROGRESS NOTE    [name].  AYT:[id number] DOB: 26-Aug-1940 DOA: 06/09/2020 PCP: [name], MD    Brief Narrative:  Mr. [name] was admitted to the hospital with the working diagnosis of symptomatic anemia, suspected acute upper GI bleed in the setting of vitamin K antagonist coagulopathy.   80 year old male past medical history for paroxysmal atrial fibrillation, chronic kidney disease stage IIIb, chronic diastolic heart failure, coronary artery disease, and sleep apnea who presented with melena and lightheadedness.  Patient reported 10 days of melanotic stools, associated with lightheadedness but no nausea or vomiting.  Patient underwent work-up as an outpatient with blood work, showing anemia and coagulopathy along with renal failure, he was referred to the hospital for further evaluation.  On his initial physical examination blood pressure 128/58, heart rate 68, respiratory rate 16, temperature 98, oxygen saturation 98% He had moist mucous membranes, his lungs were clear to auscultation bilaterally heart S1-S2, present, rhythmic, soft abdomen, no lower extremity edema.  Sodium 136, potassium 4.3, chloride 102, bicarb 24, glucose 115, BUN 42, creatinine 1.87, white count 10.9, hemoglobin 7.8, hematocrit 24.5, platelets 275. SARS COVID-19 negative.  Chest radiograph with cardiomegaly, bibasilar atelectasis.  EKG 78 bpm, right axis deviation, right bundle branch block, QTC 514, junctional rhythm, poor R wave progression, no significant ST segment or T wave changes.  Continue to have melena, hgb down to 6.9, ordered 1 unit PRBC transfusion and vitamin K to revert high INR.   Post transfusion Hgb up to 8.0 and after vitamin K INR 2,4. Plan for EGD 06/08.    Assessment & Plan:   Principal Problem:   GI bleeding Active Problems:   PAF (paroxysmal atrial fibrillation) (HCC)   Chronic diastolic congestive heart failure (HCC)   Type 2 diabetes mellitus with stage 4 chronic  kidney disease, with long-term current use of insulin (HCC)   CAD (coronary artery disease)   OSA on CPAP   CKD stage 3 due to type 2 diabetes mellitus (HCC)   Symptomatic anemia   Elevated INR   Prolonged QT interval   Blood loss anemia   Melena   Angiodysplasia of gastrointestinal tract   Gastritis     1. Upper GI bleed with acute blood loss anemia. 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  sp 1 unit PRBC transfusion.  No further melanotic stools. His Hgb has trended back down to 7.4.  Since he will be continued on anticoagulation, will transfuse 1 unit of PRBC  Status post EGD where he was noted to have evidence of esophageal candidiasis, gastritis and 2 nonbleeding angiectasia's in the stomach that were treated with APC.  Per GI, restart anticoagulation and follow hemoglobin.  2. Paroxysmal atrial fibrillation, prolonged QTc, chronic diastolic heart failure. No signs of heart failure decompensations. Continue rate control and heart failure management with carvedilol. Holding torsemide for now.  3. CKD stage 3b.  Creatinine is slowly trending up, consult appears to be near baseline.  Appears that his baseline is between 1.7-1.8.  Currently, creatinine has trended up to 2.2.  Continue to monitor.  4. T2DM dyslipidemia.   Fasting glucose is 143, will continue with insulin sliding scale for glucose cover and monitoring.  At home on dapagliflozin and 70/30 insulin.    On statin therapy.   5. Knee osteoarthritis. Continue with topical diclofenac to the right knee.   6. Depression. On alprazolam and sertraline   Patient continue to be at high risk for worsening  anemia   Status is: Inpatient  Remains inpatient appropriate because:Inpatient level of care appropriate due to severity of illness   Dispo: The patient is from: Home              Anticipated d/c is to: Home              Patient currently is not medically stable to  d/c.   Difficult to place patient No   DVT prophylaxis: scd   Code Status:   full  Family Communication:  No family at the bedside.  I offered to call his family for updates, but he has declined at this time    Consultants:   GI    Subjective: Denies any bloody stools.  No abdominal pain  Objective: Vitals:   06/12/20 1433 06/12/20 1440 06/12/20 1450 06/12/20 1534  BP: 113/63 106/73 126/75 (!) 143/82  Pulse: 66  67 67  Resp: 20 (!) 28 18   Temp: 98 F (36.7 C)   97.8 F (36.6 C)  TempSrc: Oral   Oral  SpO2: 100%  92% 96%  Weight:      Height:        Intake/Output Summary (Last 24 hours) at 06/12/2020 1813 Last data filed at 06/12/2020 1617 Gross per 24 hour  Intake 325.66 ml  Output 1150 ml  Net -824.34 ml   Filed Weights   06/09/20 1546 06/12/20 1303  Weight: 83 kg 83 kg    Examination:   General exam: Alert, awake, oriented x 3 Respiratory system: Clear to auscultation. Respiratory effort normal. Cardiovascular system:RRR. No murmurs, rubs, gallops. Gastrointestinal system: Abdomen is nondistended, soft and nontender. No organomegaly or masses felt. Normal bowel sounds heard. Central nervous system: Alert and oriented. No focal neurological deficits. Extremities: No C/C/E, +pedal pulses Skin: No rashes, lesions or ulcers Psychiatry: Judgement and insight appear normal. Mood & affect appropriate.       Data Reviewed: I have personally reviewed following labs and imaging studies  CBC: Recent Labs  Lab 06/06/20 1536 06/09/20 1601 06/10/20 0434 06/10/20 1749 06/11/20 0442 06/12/20 0537  WBC 10.0 10.9*  --   --  10.9* 9.4  NEUTROABS 8,220* 9.0*  --   --  8.5* 7.1  HGB 8.5* 7.8* 6.9* 8.3* 8.0* 7.4*  HCT 27.2* 24.5* 22.4* 27.1* 25.7* 24.3*  MCV 92.8 94.6  --   --  92.4 93.5  PLT 293 275  --   --  239 324   Basic Metabolic Panel: Recent Labs  Lab 06/06/20 1536 06/09/20 1601 06/10/20 0434 06/11/20 0442 06/12/20 0537  NA 142 136 138 136 135   K 3.9 4.3 4.1 3.8 3.7  CL 105 102 104 104 103  CO2 26 24 25 23 23   GLUCOSE 99 115* 60* 115* 143*  BUN 50* 42* 45* 40* 46*  CREATININE 1.89* 1.87* 2.01* 1.94* 2.21*  CALCIUM 8.6 8.1* 8.4* 8.4* 8.6*  MG  --   --  2.8*  --   --    GFR: Estimated Creatinine Clearance: 27.9 mL/min (A) (by C-G formula based on SCr of 2.21 mg/dL (H)). Liver Function Tests: Recent Labs  Lab 06/06/20 1536 06/09/20 1601  AST 16 16  ALT 16 15  ALKPHOS  --  111  BILITOT 0.4 0.4  PROT 6.5 6.3*  ALBUMIN  --  3.9   No results for input(s): LIPASE, AMYLASE in the last 168 hours. No results for input(s): AMMONIA in the last 168 hours. Coagulation Profile: Recent Labs  Lab 06/06/20 1536 06/09/20 1601 06/10/20 0434 06/11/20 0442 06/12/20 0537  INR 4.8* 6.1* 5.3* 2.4* 1.8*   Cardiac Enzymes: No results for input(s): CKTOTAL, CKMB, CKMBINDEX, TROPONINI in the last 168 hours. BNP (last 3 results) No results for input(s): PROBNP in the last 8760 hours. HbA1C: Recent Labs    06/10/20 0434  HGBA1C 7.0*   CBG: Recent Labs  Lab 06/12/20 0358 06/12/20 0742 06/12/20 1159 06/12/20 1317 06/12/20 1615  GLUCAP 120* 144* 151* 159* 163*   Lipid Profile: No results for input(s): CHOL, HDL, LDLCALC, TRIG, CHOLHDL, LDLDIRECT in the last 72 hours. Thyroid Function Tests: No results for input(s): TSH, T4TOTAL, FREET4, T3FREE, THYROIDAB in the last 72 hours. Anemia Panel: No results for input(s): VITAMINB12, FOLATE, FERRITIN, TIBC, IRON, RETICCTPCT in the last 72 hours.    Radiology Studies: I have reviewed all of the imaging during this hospital visit personally     Scheduled Meds: . allopurinol  300 mg Oral Daily  . atorvastatin  10 mg Oral Once per day on Mon Wed Fri  . carvedilol  6.25 mg Oral BID WC  . diclofenac Sodium  2 g Topical QID  . insulin aspart  0-9 Units Subcutaneous Q4H  . pantoprazole  40 mg Oral BID AC  . sertraline  100 mg Oral Daily   Continuous Infusions: . sodium  chloride 10 mL/hr at 06/12/20 1321     LOS: 2 days        Kathie Dike, MD

## 2020-06-12 NOTE — Op Note (Signed)
[facility] Patient Name: [name] Procedure Date: 06/12/2020 MRN: [id number] Attending MD: Jerene Bears , MD Date of Birth: [birth year] CSN: [id number] Age: 80 Admit Type: Outpatient Procedure:                Small bowel enteroscopy Indications:              Acute post hemorrhagic anemia, Melena Providers:                Lajuan Lines. Hilarie Fredrickson, MD, Ladona Ridgel, Technician,                            Benay Pillow, RN Referring MD:             [facility] Medicines:                Monitored Anesthesia Care Complications:            No immediate complications. Estimated Blood Loss:     Estimated blood loss was minimal. Procedure:                Pre-Anesthesia Assessment:                           - Prior to the procedure, a History and Physical                            was performed, and patient medications and                            allergies were reviewed. The patient's tolerance of                            previous anesthesia was also reviewed. The risks                            and benefits of the procedure and the sedation                            options and risks were discussed with the patient.                            All questions were answered, and informed consent                            was obtained. Prior Anticoagulants: The patient has                            taken Coumadin (warfarin), last dose was 3 days                            prior to procedure. ASA Grade Assessment: III - A                            patient with severe systemic disease. After  reviewing the risks and benefits, the patient was                            deemed in satisfactory condition to undergo the                            procedure.                           After obtaining informed consent, the endoscope was                            passed under direct vision. Throughout the                            procedure, the  patient's blood pressucontinuously.                            There, pulse, and oxygen saturations were monitored                            small bowel enteroscopy was accomplished without                            difficulty. The PCF-PH190L ([id number] Olympus ultra                            slim endoscope was introduced through the mouth,                            and advanced to the proximal jejunum. After                            obtaining informed consent, the endoscope was                            passed under direct vision. Throughout the                            procedure, the patient's blood pressucontinuously.                            There, pulse, and oxygen saturations were monitored                            small bowel enteroscopy was accomplished without                            difficulty. The patient tolerated the procedure                            well. Scope In: Scope Out: Findings:      Patchy, yellow plaques were found in the upper third of the esophagus       and in the middle third of the esophagus. This  is consistent with       esophageal candidiasis.      The exam of the esophagus was otherwise normal.      Scattered mild inflammation characterized by erosions (pre-pyloric) and       erythema was found in the proximal gastric body and in the prepyloric       region of the stomach. Biopsies were taken with a cold forceps for       histology.      Two 2 to 4 mm angioectasias with no bleeding were found in the gastric       body and antrum. Fulguration to ablate the lesions to prevent bleeding       by argon plasma at 1 liter/minute and 20 watts was successful.      There was no evidence of significant pathology in the entire examined       duodenum.      There was no evidence of significant pathology in the proximal jejunum. Impression:               - Esophageal plaques were found, consistent with                            candidiasis.                            - Gastritis. Biopsied.                           - Two non-bleeding angioectasias in the stomach.                            Treated with argon plasma coagulation (APC).                           - Normal examined duodenum.                           - The examined portion of the jejunum was normal. Moderate Sedation:      N/A Recommendation:           - Return patient to hospital ward for ongoing care.                           - Advance diet as tolerated.                           - Continue present medications. Okay to resume                            anticoagulation with close attention for recurrent                            GI bleeding/melena. Monitor Hgb while here and as                            an outpatient to ensure improvement.                           -  Await pathology results.                           - Discussion regarding colonoscopy, which patient                            is reluctant to pursue at this time. Procedure Code(s):        --- Professional ---                           [phone], 19, Small intestinal endoscopy, enteroscopy                            beyond second portion of duodenum, not including                            ileum; with control of bleeding (eg, injection,                            bipolar cautery, unipolar cautery, laser, heater                            probe, stapler, plasma coagulator)                           [id number], Small intestinal endoscopy, enteroscopy                            beyond second portion of duodenum, not including                            ileum; with biopsy, single or multiple Diagnosis Code(s):        --- Professional ---                           K22.9, Disease of esophagus, unspecified                           K29.70, Gastritis, unspecified, without bleeding                           K31.819, Angiodysplasia of stomach and duodenum                            without bleeding                            D62, Acute posthemorrhagic anemia                           K92.1, Melena (includes Hematochezia) CPT copyright 2019 [employer] Association. All rights reserved. The codes documented in this report are preliminary and upon coder review may  be revised to meet current compliance requirements. Jerene Bears, MD 06/12/2020 2:43:59 PM This report has been signed electronically. Number of Addenda: 0

## 2020-06-12 NOTE — Interval H&P Note (Signed)
History and Physical Interval Note: For EGD and possible small bowel enteroscopy to eval melena and hx of gastric angioectasia with bleeding. The nature of the procedure, as well as the risks, benefits, and alternatives were carefully and thoroughly reviewed with the patient. Ample time for discussion and questions allowed. The patient understood, was satisfied, and agreed to proceed.   CBC Latest Ref Rng & Units 06/12/2020 06/11/2020 06/10/2020  WBC 4.0 - 10.5 K/uL 9.4 10.9(H) -  Hemoglobin 13.0 - 17.0 g/dL 7.4(L) 8.0(L) 8.3(L)  Hematocrit 39.0 - 52.0 % 24.3(L) 25.7(L) 27.1(L)  Platelets 150 - 400 K/uL 219 239 -   Lab Results  Component Value Date   INR 1.8 (H) 06/12/2020   INR 2.4 (H) 06/11/2020   INR 5.3 (HH) 06/10/2020     06/12/2020 1:50 PM  [name]  has presented today for surgery, with the diagnosis of upper gastrointestinal bleeding.  The various methods of treatment have been discussed with the patient and family. After consideration of risks, benefits and other options for treatment, the patient has consented to  Procedure(s): ESOPHAGOGASTRODUODENOSCOPY (EGD) WITH PROPOFOL (N/A) as a surgical intervention.  The patient's history has been reviewed, patient examined, no change in status, stable for surgery.  I have reviewed the patient's chart and labs.  Questions were answered to the patient's satisfaction.     Lajuan Lines Myeesha Shane

## 2020-06-12 NOTE — Anesthesia Procedure Notes (Signed)
Procedure Name: MAC Date/Time: 06/12/2020 2:01 PM Performed by: Lollie Sails, CRNA Pre-anesthesia Checklist: Patient identified, Emergency Drugs available, Suction available, Patient being monitored and Timeout performed Oxygen Delivery Method: Simple face mask Preoxygenation: POM used.

## 2020-06-13 ENCOUNTER — Other Ambulatory Visit (HOSPITAL_COMMUNITY): Payer: Medicare Other

## 2020-06-13 LAB — CBC
HCT: 28.1 % — ABNORMAL LOW (ref 39.0–52.0)
Hemoglobin: 8.6 g/dL — ABNORMAL LOW (ref 13.0–17.0)
MCH: 28.4 pg (ref 26.0–34.0)
MCHC: 30.6 g/dL (ref 30.0–36.0)
MCV: 92.7 fL (ref 80.0–100.0)
Platelets: 238 10*3/uL (ref 150–400)
RBC: 3.03 MIL/uL — ABNORMAL LOW (ref 4.22–5.81)
RDW: 16.5 % — ABNORMAL HIGH (ref 11.5–15.5)
WBC: 9.1 10*3/uL (ref 4.0–10.5)
nRBC: 0.3 % — ABNORMAL HIGH (ref 0.0–0.2)

## 2020-06-13 LAB — TYPE AND SCREEN
ABO/RH(D): O POS
Antibody Screen: NEGATIVE
Unit division: 0
Unit division: 0

## 2020-06-13 LAB — BPAM RBC
Blood Product Expiration Date: 202207102359
Blood Product Expiration Date: 202207102359
ISSUE DATE / TIME: 202206061141
ISSUE DATE / TIME: 202206082133
Unit Type and Rh: 5100
Unit Type and Rh: 5100

## 2020-06-13 LAB — BASIC METABOLIC PANEL
Anion gap: 8 (ref 5–15)
BUN: 49 mg/dL — ABNORMAL HIGH (ref 8–23)
CO2: 23 mmol/L (ref 22–32)
Calcium: 8.5 mg/dL — ABNORMAL LOW (ref 8.9–10.3)
Chloride: 102 mmol/L (ref 98–111)
Creatinine, Ser: 2.15 mg/dL — ABNORMAL HIGH (ref 0.61–1.24)
GFR, Estimated: 30 mL/min — ABNORMAL LOW (ref >60)
Glucose, Bld: 212 mg/dL — ABNORMAL HIGH (ref 70–99)
Potassium: 3.5 mmol/L (ref 3.5–5.1)
Sodium: 133 mmol/L — ABNORMAL LOW (ref 135–145)

## 2020-06-13 LAB — PROTIME-INR
INR: 1.5 — ABNORMAL HIGH (ref 0.8–1.2)
Prothrombin Time: 17.9 seconds — ABNORMAL HIGH (ref 11.4–15.2)

## 2020-06-13 LAB — GLUCOSE, CAPILLARY
Glucose-Capillary: 135 mg/dL — ABNORMAL HIGH (ref 70–99)
Glucose-Capillary: 200 mg/dL — ABNORMAL HIGH (ref 70–99)
Glucose-Capillary: 231 mg/dL — ABNORMAL HIGH (ref 70–99)
Glucose-Capillary: 293 mg/dL — ABNORMAL HIGH (ref 70–99)

## 2020-06-13 LAB — SURGICAL PATHOLOGY

## 2020-06-13 MED ORDER — POTASSIUM CHLORIDE CRYS ER 20 MEQ PO TBCR
40.0000 meq | EXTENDED_RELEASE_TABLET | Freq: Once | ORAL | Status: AC
  Administered 2020-06-13: 40 meq via ORAL
  Filled 2020-06-13: qty 2

## 2020-06-13 MED ORDER — WARFARIN SODIUM 5 MG PO TABS: 7.5000 mg | ORAL_TABLET | Freq: Every day | ORAL | Status: DC

## 2020-06-13 MED ORDER — NYSTATIN 100000 UNIT/ML MT SUSP
5.0000 mL | Freq: Four times a day (QID) | OROMUCOSAL | Status: DC
  Administered 2020-06-13 (×2): 500000 [IU] via ORAL
  Filled 2020-06-13 (×2): qty 5

## 2020-06-13 MED ORDER — PANTOPRAZOLE SODIUM 40 MG PO TBEC: 40.0000 mg | DELAYED_RELEASE_TABLET | Freq: Two times a day (BID) | ORAL | 0 refills | Status: DC

## 2020-06-13 MED ORDER — NYSTATIN 100000 UNIT/ML MT SUSP: 5.0000 mL | Freq: Four times a day (QID) | OROMUCOSAL | 0 refills | Status: DC

## 2020-06-13 NOTE — TOC Transition Note (Signed)
Transition of Care [facility]) - CM/SW Discharge Note   Patient Details  Name: [name]. MRN: [id number] Date of Birth: 04-15-1940  Transition of Care [facility] CM/SW Contact:  Dessa Phi, RN Phone Number: 06/13/2020, 2:32 PM   Clinical Narrative:  PT recc otpt PT-patient agrees to otpt PT-referral sent-patient to call to f/u on appt. No further CM needs.           Patient Goals and CMS Choice        Discharge Placement                       Discharge Plan and Services                                     Social Determinants of Health (SDOH) Interventions     Readmission Risk Interventions No flowsheet data found.

## 2020-06-13 NOTE — Evaluation (Signed)
Physical Therapy Evaluation Only Patient Details Name: [name]. MRN: [id number] DOB: 23-Apr-1940 Today's Date: 06/13/2020   History of Present Illness  [name]. is a 80 y.o. male with c/o lightheadedness, dark stool, and outpatient blood work with low hemoglobin and elevated creatinine and INR.  EGD noted esophageal candidiasis, gastritis and 2 nonbleeding angiectasia's in the stomach. PMH: paroxysmal atrial fibrillation, CKD IIIb, chronic diastolic CHF, CAD s/p CABG, diabetes, and OSA   Clinical Impression  Pt ambulating around room without AD upon arrival. Pt with strength 4+/5 throughout BLE, denies pain with strength testing. Pt ambulates in hallway using RW per pt request, good steadiness, completes turns, no knee buckling or LOB. Recommending OPPT at d/c for R knee pain. Pt has good family support, SPC and reports he can borrow RW from church if he feels like he needs it. No acute PT needs identified, will sign off at this time.     Follow Up Recommendations Outpatient PT    Equipment Recommendations  None recommended by PT    Recommendations for Other Services       Precautions / Restrictions Precautions Precautions: None Restrictions Weight Bearing Restrictions: No      Mobility  Bed Mobility  General bed mobility comments: ambulating around room upon arrival    Transfers Overall transfer level: Modified independent Equipment used: None  General transfer comment: STS from recliner without AD, BUE assisting to power up  Ambulation/Gait Ambulation/Gait assistance: Modified independent (Device/Increase time) Gait Distance (Feet): 200 Feet Assistive device: Rolling walker (2 wheeled) Gait Pattern/deviations: WFL(Within Functional Limits) Gait velocity: WFL   General Gait Details: Pt ambulates completing turns and direction changes with RW, good steadiness, prefers RW in case R knee pain acts up, denies buckling sensation and no buckling or LOB while  ambulating  Stairs            Wheelchair Mobility    Modified Rankin (Stroke Patients Only)       Balance Overall balance assessment: No apparent balance deficits (not formally assessed)          Pertinent Vitals/Pain Pain Assessment: No/denies pain    Home Living Family/patient expects to be discharged to:: Private residence Living Arrangements: Spouse/significant other   Type of Home: House Home Access: Stairs to enter Entrance Stairs-Rails: None Technical brewer of Steps: 3 Home Layout: One level Home Equipment: Cane - single point Additional Comments: Has SPC but doesn't use it, can borrow RW from church    Prior Function Level of Independence: Independent  Comments: Pt reports independent, drives, reports 1 fall at [facility] hospital.     Hand Dominance        Extremity/Trunk Assessment   Upper Extremity Assessment Upper Extremity Assessment: Overall WFL for tasks assessed    Lower Extremity Assessment Lower Extremity Assessment: Overall WFL for tasks assessed (AROM WNL, strength 4+/5 throughout, denies numbness/tingling)    Cervical / Trunk Assessment Cervical / Trunk Assessment: Normal  Communication   Communication: No difficulties  Cognition Arousal/Alertness: Awake/alert Behavior During Therapy: WFL for tasks assessed/performed Overall Cognitive Status: Within Functional Limits for tasks assessed      General Comments      Exercises     Assessment/Plan    PT Assessment All further PT needs can be met in the next venue of care  PT Problem List Pain       PT Treatment Interventions      PT Goals (Current goals can be found in the Care  Plan section)  Acute Rehab PT Goals Patient Stated Goal: walk PT Goal Formulation: All assessment and education complete, DC therapy    Frequency     Barriers to discharge        Co-evaluation               AM-PAC PT "6 Clicks" Mobility  Outcome Measure Help needed turning  from your back to your side while in a flat bed without using bedrails?: None Help needed moving from lying on your back to sitting on the side of a flat bed without using bedrails?: None Help needed moving to and from a bed to a chair (including a wheelchair)?: None Help needed standing up from a chair using your arms (e.g., wheelchair or bedside chair)?: None Help needed to walk in hospital room?: None Help needed climbing 3-5 steps with a railing? : None 6 Click Score: 24    End of Session   Activity Tolerance: Patient tolerated treatment well Patient left: in chair;with call bell/phone within reach Nurse Communication: Mobility status PT Visit Diagnosis: Pain Pain - Right/Left: Right Pain - part of body: Knee    Time: 1610-9604 PT Time Calculation (min) (ACUTE ONLY): 13 min   Charges:   PT Evaluation $PT Eval Low Complexity: 1 Low           Tori Tamarion Haymond PT, DPT 06/13/20, 1:22 PM

## 2020-06-13 NOTE — Care Management Important Message (Signed)
Important Message  Patient Details IM Letter given to the Patient. Name: [name]. MRN: [id number] Date of Birth: Jan 10, 1940   Medicare Important Message Given:  Yes     Kerin Salen 06/13/2020, 10:04 AM

## 2020-06-13 NOTE — Discharge Summary (Signed)
Physician Discharge Summary  [name]. XBM:[id number] DOB: 1940/07/13 DOA: 06/09/2020  PCP: [name], MD  Admit date: 06/09/2020 Discharge date: 06/13/2020  Admitted From: home Disposition:  home  Recommendations for Outpatient Follow-up:  Follow up with PCP in 1 week Please obtain BMP/CBC, INR in one week Follow up with GI scheduled for 7/13  Home Health: Equipment/Devices:  Discharge Condition:stable CODE STATUS:full code Diet recommendation: heart healthy, carb modified  Brief/Interim Summary: 80 year old male with a history of paroxysmal atrial fibrillation on Coumadin, chronic kidney disease stage IIIb, chronic diastolic congestive heart failure, coronary artery disease, admitted with melena and lightheadedness.  He reported 10 days of melanotic stools.  Hemoglobin on arrival noted to be 7.8.  He was seen by GI and underwent EGD.  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  Post procedure, he did not have any recurrence of bleeding.  Discharge Diagnoses:  Principal Problem:   GI bleeding Active Problems:   PAF (paroxysmal atrial fibrillation) (HCC)   Chronic diastolic congestive heart failure (HCC)   Type 2 diabetes mellitus with stage 4 chronic kidney disease, with long-term current use of insulin (HCC)   CAD (coronary artery disease)   OSA on CPAP   CKD stage 3 due to type 2 diabetes mellitus (HCC)   Symptomatic anemia   Elevated INR   Prolonged QT interval   Blood loss anemia   Melena   Angiodysplasia of gastrointestinal tract   Gastritis  1. Upper GI bleed with acute blood loss anemia. 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 sp 2 unit PRBC transfusion. No further melanotic stools.  Hemoglobin has been stable.  Repeat check in 1 week.  He will be continued on Protonix twice a day.   Status post EGD where he was noted to have evidence of esophageal candidiasis, gastritis and 2 nonbleeding  angiectasia's in the stomach that were treated with APC.  Per GI, restart anticoagulation and follow hemoglobin.   2. Paroxysmal atrial fibrillation, prolonged QTc, chronic diastolic heart failure. No signs of heart failure decompensations. Continue rate control and heart failure management with carvedilol. Resume home dose of torsemide on discharge Coumadin has been resumed He will need repeat INR in 1 week with his primary care physician   3. CKD stage 3b.  Creatinine is slowly trending up, consult appears to be near baseline.  Appears that his baseline is between 1.7-1.8.  Currently, creatinine has been stable at 2.1 continue to monitor.   4. T2DM dyslipidemia.   Blood sugars have remained stable during his hospital stay A1c is 7.0 At home on dapagliflozin and 70/30 insulin.   Resume home regimen on discharge On statin therapy.   5. Knee osteoarthritis. Continue with topical diclofenac to the right knee.   6. Depression. On alprazolam and sertraline   Discharge Instructions  Discharge Instructions     Diet - low sodium heart healthy   Complete by: As directed    Increase activity slowly   Complete by: As directed       Allergies as of 06/13/2020       Reactions   Sunflower Oil Swelling, Other (See Comments)   Tongue and lip swelling Other reaction(s): Other Tongue and lip swelling   Horse-derived Products Other (See Comments)   Per allergy skin test UNSPECIFIED REACTION  Other reaction(s): Other Per allergy skin test UNSPECIFIED REACTION    Immune Globulin    Tetanus Toxoids Other (See Comments)  Per allergy skin test   Tetanus Toxoid    Other reaction(s): Other (See Comments) Rash(horse serum) Other reaction(s): Other Rash(horse serum)        Medication List     STOP taking these medications    metolazone 2.5 MG tablet Commonly known as: ZAROXOLYN       TAKE these medications    Accu-Chek Aviva Plus test strip Generic drug: glucose  blood CHECK BLOOD GLUCOSE 3 TIMES DAILY   Accu-Chek Aviva Plus w/Device Kit Check blood sugar 1 time  daily   Blood Glucose Monitoring Suppl Devi Test blood sugar up to three times a day or as directed.   acetaminophen 500 MG tablet Commonly known as: TYLENOL Take 1,000 mg by mouth every 6 (six) hours as needed for moderate pain or headache.   allopurinol 300 MG tablet Commonly known as: ZYLOPRIM Take 300 mg by mouth daily.   ALPRAZolam 1 MG tablet Commonly known as: XANAX Take 0.5 tablets (0.5 mg total) by mouth at bedtime as needed for sleep.   aspirin EC 81 MG tablet Take 81 mg by mouth daily.   atorvastatin 20 MG tablet Commonly known as: LIPITOR Take  1 tablet  3 x /week  for Cholesterol What changed:  how much to take how to take this when to take this additional instructions   azelastine 0.1 % nasal spray Commonly known as: ASTELIN Place 2 sprays into both nostrils 2 (two) times daily as needed for rhinitis or allergies.   bacitracin 500 UNIT/GM ointment Apply 1 application topically daily as needed for wound care.   carvedilol 6.25 MG tablet Commonly known as: COREG Take 0.5 tablets (3.125 mg total) by mouth in the morning AND 1 tablet (6.25 mg total) every evening.   dapagliflozin propanediol 5 MG Tabs tablet Commonly known as: Farxiga Take 1 tablet (5 mg total) by mouth daily before breakfast.   diphenhydrAMINE 25 MG tablet Commonly known as: BENADRYL Take 25 mg by mouth at bedtime.   fluticasone 50 MCG/ACT nasal spray Commonly known as: FLONASE Place 1 spray into both nostrils daily as needed for allergies.   GAVISCON PO Take 4 tablets by mouth daily as needed (acid reflux).   insulin NPH-regular Human (70-30) 100 UNIT/ML injection Inject 45 Units into the skin daily with breakfast. 35 units in the evening   loratadine 10 MG tablet Commonly known as: CLARITIN Take 10 mg by mouth daily as needed for allergies.   nystatin 100000 UNIT/ML  suspension Commonly known as: MYCOSTATIN Take 5 mLs (500,000 Units total) by mouth 4 (four) times daily.   pantoprazole 40 MG tablet Commonly known as: PROTONIX Take 1 tablet (40 mg total) by mouth 2 (two) times daily before a meal.   polyvinyl alcohol 1.4 % ophthalmic solution Commonly known as: LIQUIFILM TEARS Place 1 drop into both eyes daily as needed for dry eyes.   potassium chloride SA 20 MEQ tablet Commonly known as: KLOR-CON TAKE 2 TABLETS BY MOUTH 3  TIMES DAILY FOR POTASSIUM What changed:  how much to take how to take this when to take this additional instructions   PreserVision AREDS 2 Caps Take 1 capsule by mouth 2 (two) times daily.   sertraline 100 MG tablet Commonly known as: ZOLOFT TAKE 1 TABLET BY MOUTH  DAILY FOR MOOD   torsemide 100 MG tablet Commonly known as: DEMADEX Take 150 mg by mouth daily. 1.5 tablet   VITAMIN B COMPLEX PO Take 1 tablet by mouth at bedtime.  vitamin C 1000 MG tablet Take 1,000 mg by mouth daily.   VITAMIN D PO Take 5,000 Units by mouth daily.   warfarin 5 MG tablet Commonly known as: COUMADIN Take    1.5 tablets /day      or as directed   zinc gluconate 50 MG tablet Take 50 mg by mouth daily.        Follow-up Information     Willia Craze, NP Follow up on 07/17/2020.   Specialty: Gastroenterology Why: at 2pm. Please arrive 10 minutes early for appointment Contact information: [address] [place] [address] [phone]                Allergies  Allergen Reactions   Sunflower Oil Swelling and Other (See Comments)    Tongue and lip swelling Other reaction(s): Other Tongue and lip swelling   Horse-Derived Products Other (See Comments)    Per allergy skin test UNSPECIFIED REACTION  Other reaction(s): Other Per allergy skin test UNSPECIFIED REACTION    Immune Globulin    Tetanus Toxoids Other (See Comments)    Per allergy skin test   Tetanus Toxoid     Other reaction(s): Other (See  Comments) Rash(horse serum) Other reaction(s): Other Rash(horse serum)    Consultations: Gastroenterology   Procedures/Studies: DG Chest Portable 1 View  Result Date: 06/09/2020 CLINICAL DATA:  Weakness EXAM: PORTABLE CHEST 1 VIEW COMPARISON:  Chest x-ray dated 02/06/2020. FINDINGS: Stable cardiomegaly. Median sternotomy wires appear intact and stable in alignment. Central pulmonary vascular congestion persists, likely chronic. Coarse lung markings bilaterally, LEFT greater than RIGHT, suggesting chronic interstitial lung disease/fibrosis. No new confluent opacity to suggest a developing pneumonia. No pleural effusion or pneumothorax is seen. IMPRESSION: 1. No active disease. No evidence of pneumonia or pulmonary edema. 2. Stable cardiomegaly.  Suspect chronic mild CHF. 3. 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 Electronically Signed   By: Franki Cabot M.D.   On: 06/09/2020 17:28      Subjective: Had a brown stools this morning. Has some chronic right knee pain. No dizziness or lightheadedness on standing  Discharge Exam: Vitals:   06/12/20 2115 06/12/20 2154 06/13/20 0026 06/13/20 0414  BP: 123/70 132/71 118/78 140/87  Pulse: 64 66 64 63  Resp: 18 18 (!) 21 14  Temp: 97.6 F (36.4 C) 98.1 F (36.7 C) 98.3 F (36.8 C) 98 F (36.7 C)  TempSrc: Oral Oral Oral Oral  SpO2: 97% 97% 98% 98%  Weight:      Height:        General: Pt is alert, awake, not in acute distress Cardiovascular: irregular, S1/S2 +, no rubs, no gallops Respiratory: CTA bilaterally, no wheezing, no rhonchi Abdominal: Soft, NT, ND, bowel sounds + Extremities: no edema, no cyanosis    The results of significant diagnostics from this hospitalization (including imaging, microbiology, ancillary and laboratory) are listed below for reference.     Microbiology: Recent Results (from the past 240 hour(s))  Resp Panel by RT-PCR (Flu A&B, Covid) Nasopharyngeal Swab     Status: None   Collection  Time: 06/09/20  4:54 PM   Specimen: Nasopharyngeal Swab; Nasopharyngeal(NP) swabs in vial transport medium  Result Value Ref Range Status   SARS Coronavirus 2 by RT PCR NEGATIVE NEGATIVE Final    Comment: (NOTE) SARS-CoV-2 target nucleic acids are NOT DETECTED.  The SARS-CoV-2 RNA is generally detectable in upper respiratory specimens during the acute phase of infection. The lowest concentration of SARS-CoV-2 viral copies this assay can detect is  138 copies/mL. A negative result does not preclude SARS-Cov-2 infection and should not be used as the sole basis for treatment or other patient management decisions. A negative result may occur with  improper specimen collection/handling, submission of specimen other than nasopharyngeal swab, presence of viral mutation(s) within the areas targeted by this assay, and inadequate number of viral copies(<138 copies/mL). A negative result must be combined with clinical observations, patient history, and epidemiological information. The expected result is Negative.  Fact Sheet for Patients:  EntrepreneurPulse.com.au  Fact Sheet for Healthcare Providers:  IncredibleEmployment.be  This test is no t yet approved or cleared by the [place] FDA and  has been authorized for detection and/or diagnosis of SARS-CoV-2 by FDA under an Emergency Use Authorization (EUA). This EUA will remain  in effect (meaning this test can be used) for the duration of the COVID-19 declaration under Section 564(b)(1) of the Act, 21 U.S.C.section 360bbb-3(b)(1), unless the authorization is terminated  or revoked sooner.       Influenza A by PCR NEGATIVE NEGATIVE Final   Influenza B by PCR NEGATIVE NEGATIVE Final    Comment: (NOTE) The Xpert Xpress SARS-CoV-2/FLU/RSV plus assay is intended as an aid in the diagnosis of influenza from Nasopharyngeal swab specimens and should not be used as a sole basis for treatment. Nasal washings  and aspirates are unacceptable for Xpert Xpress SARS-CoV-2/FLU/RSV testing.  Fact Sheet for Patients: EntrepreneurPulse.com.au  Fact Sheet for Healthcare Providers: IncredibleEmployment.be  This test is not yet approved or cleared by the [place] FDA and has been authorized for detection and/or diagnosis of SARS-CoV-2 by FDA under an Emergency Use Authorization (EUA). This EUA will remain in effect (meaning this test can be used) for the duration of the COVID-19 declaration under Section 564(b)(1) of the Act, 21 U.S.C. section 360bbb-3(b)(1), unless the authorization is terminated or revoked.  Performed at [facility], [address], [place], [place] [address]      Labs: BNP (last 3 results) Recent Labs    07/03/19 1312  BNP 742.5*   Basic Metabolic Panel: Recent Labs  Lab 06/09/20 1601 06/10/20 0434 06/11/20 0442 06/12/20 0537 06/13/20 0510  NA 136 138 136 135 133*  K 4.3 4.1 3.8 3.7 3.5  CL 102 104 104 103 102  CO2 24 25 23 23 23   GLUCOSE 115* 60* 115* 143* 212*  BUN 42* 45* 40* 46* 49*  CREATININE 1.87* 2.01* 1.94* 2.21* 2.15*  CALCIUM 8.1* 8.4* 8.4* 8.6* 8.5*  MG  --  2.8*  --   --   --    Liver Function Tests: Recent Labs  Lab 06/06/20 1536 06/09/20 1601  AST 16 16  ALT 16 15  ALKPHOS  --  111  BILITOT 0.4 0.4  PROT 6.5 6.3*  ALBUMIN  --  3.9   No results for input(s): LIPASE, AMYLASE in the last 168 hours. No results for input(s): AMMONIA in the last 168 hours. CBC: Recent Labs  Lab 06/06/20 1536 06/09/20 1601 06/10/20 0434 06/10/20 1749 06/11/20 0442 06/12/20 0537 06/13/20 0510  WBC 10.0 10.9*  --   --  10.9* 9.4 9.1  NEUTROABS 8,220* 9.0*  --   --  8.5* 7.1  --   HGB 8.5* 7.8* 6.9* 8.3* 8.0* 7.4* 8.6*  HCT 27.2* 24.5* 22.4* 27.1* 25.7* 24.3* 28.1*  MCV 92.8 94.6  --   --  92.4 93.5 92.7  PLT 293 275  --   --  239 219 238   Cardiac Enzymes:  No results for input(s):  CKTOTAL, CKMB, CKMBINDEX, TROPONINI in the last 168 hours. BNP: Invalid input(s): POCBNP CBG: Recent Labs  Lab 06/12/20 1615 06/12/20 1942 06/12/20 2359 06/13/20 0409 06/13/20 0727  GLUCAP 163* 211* 293* 200* 231*   D-Dimer No results for input(s): DDIMER in the last 72 hours. Hgb A1c No results for input(s): HGBA1C in the last 72 hours. Lipid Profile No results for input(s): CHOL, HDL, LDLCALC, TRIG, CHOLHDL, LDLDIRECT in the last 72 hours. Thyroid function studies No results for input(s): TSH, T4TOTAL, T3FREE, THYROIDAB in the last 72 hours.  Invalid input(s): FREET3 Anemia work up No results for input(s): VITAMINB12, FOLATE, FERRITIN, TIBC, IRON, RETICCTPCT in the last 72 hours. Urinalysis    Component Value Date/Time   COLORURINE YELLOW 10/11/2019 1004   APPEARANCEUR CLEAR 10/11/2019 1004   LABSPEC 1.008 10/11/2019 1004   PHURINE 6.5 10/11/2019 1004   GLUCOSEU NEGATIVE 10/11/2019 1004   GLUCOSEU 500 06/04/2010 1626   HGBUR NEGATIVE 10/11/2019 1004   BILIRUBINUR NEGATIVE 03/23/2016 1529   KETONESUR NEGATIVE 10/11/2019 1004   PROTEINUR NEGATIVE 10/11/2019 1004   UROBILINOGEN 0.2 03/05/2014 1627   NITRITE NEGATIVE 10/11/2019 1004   LEUKOCYTESUR NEGATIVE 10/11/2019 1004   Sepsis Labs Invalid input(s): PROCALCITONIN,  WBC,  LACTICIDVEN Microbiology Recent Results (from the past 240 hour(s))  Resp Panel by RT-PCR (Flu A&B, Covid) Nasopharyngeal Swab     Status: None   Collection Time: 06/09/20  4:54 PM   Specimen: Nasopharyngeal Swab; Nasopharyngeal(NP) swabs in vial transport medium  Result Value Ref Range Status   SARS Coronavirus 2 by RT PCR NEGATIVE NEGATIVE Final    Comment: (NOTE) SARS-CoV-2 target nucleic acids are NOT DETECTED.  The SARS-CoV-2 RNA is generally detectable in upper respiratory specimens during the acute phase of infection. The lowest concentration of SARS-CoV-2 viral copies this assay can detect is 138 copies/mL. A negative result does not  preclude SARS-Cov-2 infection and should not be used as the sole basis for treatment or other patient management decisions. A negative result may occur with  improper specimen collection/handling, submission of specimen other than nasopharyngeal swab, presence of viral mutation(s) within the areas targeted by this assay, and inadequate number of viral copies(<138 copies/mL). A negative result must be combined with clinical observations, patient history, and epidemiological information. The expected result is Negative.  Fact Sheet for Patients:  EntrepreneurPulse.com.au  Fact Sheet for Healthcare Providers:  IncredibleEmployment.be  This test is no t yet approved or cleared by the [place] FDA and  has been authorized for detection and/or diagnosis of SARS-CoV-2 by FDA under an Emergency Use Authorization (EUA). This EUA will remain  in effect (meaning this test can be used) for the duration of the COVID-19 declaration under Section 564(b)(1) of the Act, 21 U.S.C.section 360bbb-3(b)(1), unless the authorization is terminated  or revoked sooner.       Influenza A by PCR NEGATIVE NEGATIVE Final   Influenza B by PCR NEGATIVE NEGATIVE Final    Comment: (NOTE) The Xpert Xpress SARS-CoV-2/FLU/RSV plus assay is intended as an aid in the diagnosis of influenza from Nasopharyngeal swab specimens and should not be used as a sole basis for treatment. Nasal washings and aspirates are unacceptable for Xpert Xpress SARS-CoV-2/FLU/RSV testing.  Fact Sheet for Patients: EntrepreneurPulse.com.au  Fact Sheet for Healthcare Providers: IncredibleEmployment.be  This test is not yet approved or cleared by the [place] FDA and has been authorized for detection and/or diagnosis of SARS-CoV-2 by FDA under an Emergency Use Authorization (EUA). This  EUA will remain in effect (meaning this test can be used) for the  duration of the COVID-19 declaration under Section 564(b)(1) of the Act, 21 U.S.C. section 360bbb-3(b)(1), unless the authorization is terminated or revoked.  Performed at [facility], [address], [place], [place] [address]      Time coordinating discharge: 13mns  SIGNED:   JKathie Dike MD  [facility] 06/13/2020, 10:55 AM   If 7PM-7AM, please contact night-coverage [email or web address]

## 2020-06-13 NOTE — Progress Notes (Addendum)
Progress Note  Chief Complaint:    GI bleed     ASSESSMENT / PLAN:    # 80 yo male with multiple medical problems as listed below. Admitted with melena and acute on chronic anemia in setting of supratherapeutic INR ( 6.1).  Small bowel enteroscopy 06/12/20 showed esophageal candidiasis, gastritis and two 2-4 mm non-bleeding angioectasias in the stomach. Treated with APC. GI probably secondary to AVMs. Patient is reluctant to have a colonoscopy.  --Had a brown stool this am --Received a 2nd unit of PRBC yesterday after hgb drifted to 7.4. Hemoglobin improved appropriately to 8.6.  --Gastric biopsies pending  --Continue Protonix PO BID post APC. Will decrease to once daily when I see him in office --Warfarin restarted. Monitor for bleeding.  --Nystatin S&S --Okay for discharge home from GI standpoint.  --Follow up with me in office 7/13 at 2pm   # CKD IIIb   # Prolonged QTc, 513 ms in ED.   # Chronic diastolic heart failure   # Diabetes, on insulin   # PAF, on coumadin at home.   # Hx of adenomatous colon polyps- 15 mm polyp at appendiceal orifice wa biopsied (tubular adenoma Jan 2019). He subsequently underwent extended appendectomy     SUBJECTIVE:   No complaints. Had a brown stool this am. Wants to go home.     OBJECTIVE:    Scheduled inpatient medications:   allopurinol  300 mg Oral Daily   atorvastatin  10 mg Oral Once per day on Mon Wed Fri   carvedilol  6.25 mg Oral BID WC   diclofenac Sodium  2 g Topical QID   insulin aspart  0-15 Units Subcutaneous TID WC   insulin aspart  0-5 Units Subcutaneous QHS   pantoprazole  40 mg Oral BID AC   sertraline  100 mg Oral Daily   Warfarin - Pharmacist Dosing Inpatient   Does not apply q1600   Continuous inpatient infusions:   sodium chloride 10 mL/hr at 06/12/20 1321   PRN inpatient medications: acetaminophen **OR** acetaminophen, ALPRAZolam, fluticasone, loratadine, polyvinyl alcohol  Vital signs in last 24  hours: Temp:  [97.6 F (36.4 C)-98.3 F (36.8 C)] 98 F (36.7 C) (06/09 0414) Pulse Rate:  [63-69] 63 (06/09 0414) Resp:  [14-28] 14 (06/09 0414) BP: (106-143)/(63-87) 140/87 (06/09 0414) SpO2:  [92 %-100 %] 98 % (06/09 0414) Weight:  [83 kg] 83 kg (06/08 1303) Last BM Date: 06/11/20  Intake/Output Summary (Last 24 hours) at 06/13/2020 0848 Last data filed at 06/13/2020 0640 Gross per 24 hour  Intake 733.33 ml  Output 1450 ml  Net -716.67 ml     Physical Exam:  General: Alert male in NAD Heart:  Regular rate and rhythm. No lower extremity edema Pulmonary: Normal respiratory effort Abdomen: Soft, nondistended, nontender. Normal bowel sounds.  Neurologic: Alert and oriented Psych: Pleasant. Cooperative.   Filed Weights   06/09/20 1546 06/12/20 1303  Weight: 83 kg 83 kg    Intake/Output from previous day: 06/08 0701 - 06/09 0700 In: 733.3 [I.V.:421.3; Blood:312] Out: 4268 [Urine:1450] Intake/Output this shift: No intake/output data recorded.    Lab Results: Recent Labs    06/11/20 0442 06/12/20 0537 06/13/20 0510  WBC 10.9* 9.4 9.1  HGB 8.0* 7.4* 8.6*  HCT 25.7* 24.3* 28.1*  PLT 239 219 238   BMET Recent Labs    06/11/20 0442 06/12/20 0537 06/13/20 0510  NA 136 135 133*  K 3.8 3.7 3.5  CL 104 103 102  CO2 23 23 23   GLUCOSE 115* 143* 212*  BUN 40* 46* 49*  CREATININE 1.94* 2.21* 2.15*  CALCIUM 8.4* 8.6* 8.5*   LFT No results for input(s): PROT, ALBUMIN, AST, ALT, ALKPHOS, BILITOT, BILIDIR, IBILI in the last 72 hours. PT/INR Recent Labs    06/12/20 0537 06/13/20 0510  LABPROT 20.9* 17.9*  INR 1.8* 1.5*   Hepatitis Panel No results for input(s): HEPBSAG, HCVAB, HEPAIGM, HEPBIGM in the last 72 hours.  No results found.      Principal Problem:   GI bleeding Active Problems:   PAF (paroxysmal atrial fibrillation) (HCC)   Chronic diastolic congestive heart failure (HCC)   Type 2 diabetes mellitus with stage 4 chronic kidney disease, with  long-term current use of insulin (HCC)   CAD (coronary artery disease)   OSA on CPAP   CKD stage 3 due to type 2 diabetes mellitus (HCC)   Symptomatic anemia   Elevated INR   Prolonged QT interval   Blood loss anemia   Melena   Angiodysplasia of gastrointestinal tract   Gastritis     LOS: 3 days   Tye Savoy ,NP 06/13/2020, 8:48 AM

## 2020-06-13 NOTE — Progress Notes (Signed)
WEnt over discharge papers with patient and family.  All questions answered. VSS.  Pt wheeled out by NT.

## 2020-06-13 NOTE — Progress Notes (Signed)
ANTICOAGULATION CONSULT NOTE - Initial Consult  Pharmacy Consult for Warfarin Indication: atrial fibrillation  Allergies  Allergen Reactions   Sunflower Oil Swelling and Other (See Comments)    Tongue and lip swelling Other reaction(s): Other Tongue and lip swelling   Horse-Derived Products Other (See Comments)    Per allergy skin test UNSPECIFIED REACTION  Other reaction(s): Other Per allergy skin test UNSPECIFIED REACTION    Immune Globulin    Tetanus Toxoids Other (See Comments)    Per allergy skin test   Tetanus Toxoid     Other reaction(s): Other (See Comments) Rash(horse serum) Other reaction(s): Other Rash(horse serum)    Patient Measurements: Height: 5\' 11"  (180.3 cm) Weight: 83 kg (182 lb 15.7 oz) IBW/kg (Calculated) : 75.3  Vital Signs: Temp: 98 F (36.7 C) (06/09 0414) Temp Source: Oral (06/09 0414) BP: 140/87 (06/09 0414) Pulse Rate: 63 (06/09 0414)  Labs: Recent Labs    06/11/20 0442 06/12/20 0537 06/13/20 0510  HGB 8.0* 7.4* 8.6*  HCT 25.7* 24.3* 28.1*  PLT 239 219 238  LABPROT 26.4* 20.9* 17.9*  INR 2.4* 1.8* 1.5*  CREATININE 1.94* 2.21* 2.15*     Estimated Creatinine Clearance: 28.7 mL/min (A) (by C-G formula based on SCr of 2.15 mg/dL (H)).   Medical History: Past Medical History:  Diagnosis Date   Adrenal adenoma    Arthritis    Atypical atrial flutter ([place]) 8/15, 10/15   a. DCCV 08/2013. b. s/p RFA 10/2013.   Basal cell carcinoma    CAD (coronary artery disease)    a. 04/2013 CABG x 2: LIMA to LAD, SVG to RI, EVH via R thigh.   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 s/p MAZE 04/2013 in setting of CABG. b. Amio stopped in 10/2013 after flutter ablation.   PFO (patent foramen ovale)    a. Small PFO by TEE 10/2013.   Pleural effusion, left    a. s/p thoracentesis 05/2013.   Respiratory failure ([place])    a. Hypoxia 10/2013 - required supp O2 as inpatient, did not require it at discharge.   S/P Maze operation for atrial fibrillation    a. 04/2013: Complete bilateral atrial lesion set using cryothermy and bipolar radiofrequency ablation with clipping of LA appendage (@ time of CABG)    Medications:  Scheduled:   allopurinol  300 mg Oral Daily   atorvastatin  10 mg Oral Once per day on Mon Wed Fri   carvedilol  6.25 mg Oral BID WC   diclofenac Sodium  2 g Topical QID   insulin aspart  0-15 Units Subcutaneous TID WC   insulin aspart  0-5 Units Subcutaneous QHS   pantoprazole  40 mg Oral BID AC  sertraline  100 mg Oral Daily   Warfarin - Pharmacist Dosing Inpatient   Does not apply q1600   Infusions:   sodium chloride 10 mL/hr at 06/12/20 1321   PRN: acetaminophen **OR** acetaminophen, ALPRAZolam, fluticasone, loratadine, polyvinyl alcohol  Assessment: 80 yo male on chronic warfarin for afib. 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   Pharmacy consulted to resume warfarin on 6/8, cleared by GI to resume anticoagulation with monitoring of Hgb and INR.   Home warfarin dose (per outpatient records): 7.5 mg daily, except 10 mg on Mon,Thurs  INR on admission = 6.1 (SUPRAtherapeutic) Last warfarin dose PTA reported on 6/5  AM  Significant Events: -Phytonadione 2.5 mg PO x1 dose on 6/5 -Warfarin held 6/6 & 6/7  Patient reported a possibility that he had accidentally been taking warfarin differently/incorrectly recently, believes he likely has been taking 10 mg dose more frequently than 2x/week but unable to confirm at this time. If so, would account for elevated INR on admission.  Today, 06/13/20 INR = 1.5 remains subtherapeutic as expected s/p vitamin K and holding warfarin  CBC: Hgb 8.6 slightly increased s/p transfusion; Plt WNL Diet: Heart Healthy/carb modified Drug interactions: None significant No bleeding documented  Goal of Therapy:  INR 2-3   Plan:  Warfarin 7.5 mg PO daily Discussed warfarin dosing with MD who manages dose outpatient - recommend to discharge patient on 7.5 mg PO daily with follow up in office 1 week after resuming anticoaguation for INR check.  INR daily while inpatient, CBC with AM labs tomorrow Monitor for signs and symptoms of bleeding  Lenis Noon, PharmD 06/13/20 10:42 AM

## 2020-06-14 ENCOUNTER — Other Ambulatory Visit: Payer: Self-pay | Admitting: Internal Medicine

## 2020-06-14 ENCOUNTER — Encounter (HOSPITAL_COMMUNITY): Payer: Self-pay | Admitting: Internal Medicine

## 2020-06-14 ENCOUNTER — Telehealth: Payer: Self-pay

## 2020-06-14 MED ORDER — DEXAMETHASONE 4 MG PO TABS: ORAL_TABLET | ORAL | 0 refills | Status: DC

## 2020-06-14 NOTE — Telephone Encounter (Signed)
I have attempted to contact this patient by phone but was unavailable. I spoke with [name], his wife and she stated that he was sleeping. Explained the reason for my call was to do a Follow Up on his recent hospital visit and to schedule an office visit for sometime next week.  Patient will call back when he is able to.

## 2020-06-17 NOTE — Progress Notes (Signed)
Hospital follow up  Assessment and Plan: Hospital visit follow up for:   Dajour was seen today for hospitalization follow-up.  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  Discussed if patient falls to immediately contact office or go to ER. Discussed foods that can increase or decrease Coumadin levels. Patient understands to call the office before starting a new medication. Keep close follow up in 2 weeks  -     CBC with Differential/Platelet -     Protime-INR  Symptomatic anemia Sx improved, s/p transfusion x 2 Poorly tolerates oral iron; discussed may try fusion plus or very low dose slow release iron every other day with vit C and probiotic/stool softener -     CBC with Differential/Platelet  Acute gastritis with hemorrhage, unspecified gastritis type Continue PPI BID x ~12 weeks, GI follow up scheduled July 13/2022. Advised to stop bASA, avoid all NSAIDs -     CBC with Differential/Platelet  CKD stage 3 due to type 2 diabetes mellitus (HCC) -     BASIC METABOLIC PANEL WITH GFR  Medication management -     BASIC METABOLIC PANEL WITH GFR -     CBC with Differential/Platelet -     Protime-INR  All medications were reviewed with patient and family and fully reconciled. All questions answered fully, and patient and family members were encouraged to call the office with any further questions or concerns. Discussed goal to avoid readmission related to this diagnosis.   Medications Discontinued During This Encounter  Medication Reason   aspirin EC 81 MG  tablet Discontinued by provider    Over 40 minutes of exam, counseling, chart review, and complex, high/moderate level critical decision making was performed this visit.   Future Appointments  Date Time Provider Department Center  07/01/2020  2:30 PM McKeown, William, MD [facility] None  07/02/2020 12:30 PM [facility] LAB [facility] None  07/02/2020  1:00 PM Thayil, Irene T, PA-C [facility] None  07/02/2020  2:00 PM [facility] INFUSION [facility] None  07/03/2020  1:30 PM Matthews, John D, MD [facility] None  07/17/2020  2:00 PM Guenther, Paula M, NP [facility]  09/13/2020 11:40 AM Bensimhon, Daniel R, MD [facility] None  10/23/2020 11:00 AM McKeown, William, MD [facility] None  02/26/2021  3:30 PM Corbett, Ashley, NP [facility] None     HPI 80 y.o.male presents for follow up for transition from recent hospitalization or SNIF stay. Admit date to the hospital was 06/09/20, patient was discharged from the hospital on 06/13/20 and our clinical staff contacted the office the day after discharge to set up a follow up appointment. The discharge summary, medications, and diagnostic test results were reviewed before meeting with the patient. The patient was admitted for:   Discharge Diagnoses:  Principal Problem:   GI bleeding Active Problems:   PAF (paroxysmal atrial fibrillation) (HCC)   Chronic diastolic congestive heart failure (HCC)   Type 2 diabetes mellitus with stage 4 chronic kidney disease, with long-term current use of insulin (HCC)   CAD (coronary artery disease)   OSA on CPAP   CKD stage 3 due to type 2 diabetes mellitus (  HCC)   Symptomatic anemia   Elevated INR   Prolonged QT interval   Blood loss anemia   Melena   Angiodysplasia of gastrointestinal tract   Gastritis    Brief/Interim Summary per discharge note: 80-year-old male with a history of paroxysmal atrial fibrillation on Coumadin, chronic kidney disease stage IIIb, chronic diastolic congestive heart failure,  coronary artery disease, admitted with melena and lightheadedness.  He reported 10 days of melanotic stools.  Hemoglobin on arrival noted to be 7.8.  He was seen by GI and underwent EGD.  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  Post procedure, he did not have any recurrence of bleeding.    Upper GI bleed with acute blood loss anemia. 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 sp 2 unit PRBC transfusion. No further melanotic stools.  Hemoglobin has been stable.  Repeat check in 1 week.  He will be continued on Protonix twice a day.   Status post EGD where he was noted to have evidence of esophageal candidiasis, gastritis and 2 nonbleeding angiectasia's in the stomach that were treated with APC.  Per GI, restart anticoagulation and follow hemoglobin.   Paroxysmal atrial fibrillation, prolonged QTc, chronic diastolic heart failure. No signs of heart failure decompensations. Continue rate control and heart failure management with carvedilol. Resume home dose of torsemide on discharge Coumadin has been resumed He will need repeat INR in 1 week with his primary care physician   CKD stage 3b.  Creatinine is slowly trending up, consult appears to be near baseline.  Appears that his baseline is between 1.7-1.8.     Hostpial follow up 06/17/2020:   BP 114/64   Pulse 66   Temp (!) 96.8 F (36 C)   Wt 174 lb (78.9 kg)   SpO2 99%   BMI 24.27 kg/m   80 y.o. presents for 5 day hospital follow up following acute anemia r/t GI bleed. History of paroxysmal atrial fibrillation on Coumadin, presented to ED reported 10 days of melanotic stools with fatigue/lightheaded. Hemoglobin on arrival noted to be 7.8.  He was seen by GI and underwent EGD.  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  Post procedure, he did not have any recurrence of bleeding. EGD also found esophageal candidiasis and was discharged on nystatin. He continues  with protonix BID.   He notes fatigue has improved in the last few days, denies dizziness.  Admits hasn't taken iron, has caused constipation in the past.   He is taking 7.5 mg coumadin daily since discharge, 5 days ago.  He was taking 10 mg 5 days and 7.5 2 days a week prior to admission, ? Was supposed to be 7.5 mg 5 days and 10 mg x 2 days.- this may explain elevated INR at admission.    CBC Latest Ref Rng & Units 06/13/2020 06/12/2020 06/11/2020  WBC 4.0 - 10.5 K/uL 9.1 9.4 10.9(H)  Hemoglobin 13.0 - 17.0 g/dL 8.6(L) 7.4(L) 8.0(L)  Hematocrit 39.0 - 52.0 % 28.1(L) 24.3(L) 25.7(L)  Platelets 150 - 400 K/uL 238 219 239    Lab Results  Component Value Date   INR 1.5 (H) 06/13/2020   INR 1.8 (H) 06/12/2020   INR 2.4 (H) 06/11/2020   Lab Results  Component Value Date   GFRNONAA 30 (L) 06/13/2020   GFRNONAA 29 (L) 06/12/2020   GFRNONAA 34 (L) 06/11/2020    Recommendations for Outpatient Follow-up:    Follow up with PCP in 1 week Please obtain BMP/CBC, INR in one week Follow up with GI scheduled for 7/13  Home health is not involved.   Images while in the hospital: DG Chest Portable 1 View  Result Date: 06/09/2020 CLINICAL DATA:  Weakness EXAM: PORTABLE CHEST 1 VIEW COMPARISON:  Chest x-ray dated 02/06/2020. FINDINGS: Stable cardiomegaly. Median sternotomy wires appear intact and stable in alignment. Central pulmonary vascular congestion persists, likely chronic. Coarse lung markings bilaterally, LEFT greater than RIGHT, suggesting chronic interstitial lung disease/fibrosis. No new confluent opacity to suggest a developing pneumonia. No pleural effusion or pneumothorax is seen. IMPRESSION: 1. No active disease. No evidence of pneumonia or pulmonary edema. 2. Stable cardiomegaly.  Suspect chronic mild CHF. 3. 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 Electronically Signed   By: Stan  Maynard M.D.   On: 06/09/2020 17:28     Current Outpatient Medications (Endocrine &  Metabolic):    dapagliflozin propanediol (FARXIGA) 5 MG TABS tablet, Take 1 tablet (5 mg total) by mouth daily before breakfast.   dexamethasone (DECADRON) 4 MG tablet, Take 1 tab 3 x day - 3 days, then 2 x day - 3 days, then 1 tab daily   insulin NPH-regular Human (70-30) 100 UNIT/ML injection, Inject 45 Units into the skin daily with breakfast. 35 units in the evening  Current Outpatient Medications (Cardiovascular):    atorvastatin (LIPITOR) 20 MG tablet, Take  1 tablet  3 x /week  for Cholesterol   carvedilol (COREG) 6.25 MG tablet, Take 0.5 tablets (3.125 mg total) by mouth in the morning AND 1 tablet (6.25 mg total) every evening.   torsemide (DEMADEX) 100 MG tablet, Take 150 mg by mouth daily. 1.5 tablet  Current Outpatient Medications (Respiratory):    azelastine (ASTELIN) 0.1 % nasal spray, Place 2 sprays into both nostrils 2 (two) times daily as needed for rhinitis or allergies.   diphenhydrAMINE (BENADRYL) 25 MG tablet, Take 25 mg by mouth at bedtime.   fluticasone (FLONASE) 50 MCG/ACT nasal spray, Place 1 spray into both nostrils daily as needed for allergies.   loratadine (CLARITIN) 10 MG tablet, Take 10 mg by mouth daily as needed for allergies.   Current Outpatient Medications (Analgesics):    acetaminophen (TYLENOL) 500 MG tablet, Take 1,000 mg by mouth every 6 (six) hours as needed for moderate pain or headache.    allopurinol (ZYLOPRIM) 300 MG tablet, Take 300 mg by mouth daily.  Current Outpatient Medications (Hematological):    warfarin (COUMADIN) 5 MG tablet, Take    1.5 tablets /day      or as directed  Current Outpatient Medications (Other):    ACCU-CHEK AVIVA PLUS test strip, CHECK BLOOD GLUCOSE 3 TIMES DAILY   ALPRAZolam (XANAX) 1 MG tablet, Take 0.5 tablets (0.5 mg total) by mouth at bedtime as needed for sleep.   Alum Hydroxide-Mag Carbonate (GAVISCON PO), Take 4 tablets by mouth daily as needed (acid reflux).    Ascorbic Acid (VITAMIN C) 1000 MG tablet, Take  1,000 mg by mouth daily.   B Complex Vitamins (VITAMIN B COMPLEX PO), Take 1 tablet by mouth at bedtime.    bacitracin 500 UNIT/GM ointment, Apply 1 application topically daily as needed for wound care.   Blood Glucose Monitoring Suppl (ACCU-CHEK AVIVA PLUS) w/Device KIT, Check blood sugar 1 time  daily   Blood Glucose Monitoring Suppl DEVI, Test blood sugar up to three times a day or as directed.   Cholecalciferol (VITAMIN D PO), Take 5,000 Units by mouth   daily.    Multiple Vitamins-Minerals (PRESERVISION AREDS 2) CAPS, Take 1 capsule by mouth 2 (two) times daily.    nystatin (MYCOSTATIN) 100000 UNIT/ML suspension, Take 5 mLs (500,000 Units total) by mouth 4 (four) times daily.   pantoprazole (PROTONIX) 40 MG tablet, Take 1 tablet (40 mg total) by mouth 2 (two) times daily before a meal.   polyvinyl alcohol (LIQUIFILM TEARS) 1.4 % ophthalmic solution, Place 1 drop into both eyes daily as needed for dry eyes.   potassium chloride SA (KLOR-CON) 20 MEQ tablet, TAKE 2 TABLETS BY MOUTH 3  TIMES DAILY FOR POTASSIUM   sertraline (ZOLOFT) 100 MG tablet, TAKE 1 TABLET BY MOUTH  DAILY FOR MOOD   zinc gluconate 50 MG tablet, Take 50 mg by mouth daily.  Past Medical History:  Diagnosis Date   Adrenal adenoma    Arthritis    Atypical atrial flutter ([place]) 8/15, 10/15   a. DCCV 08/2013. b. s/p RFA 10/2013.   Basal cell carcinoma    CAD (coronary artery disease)    a. 04/2013 CABG x 2: LIMA to LAD, SVG to RI, EVH via R thigh.   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 s/p MAZE 04/2013 in setting of CABG. b. Amio stopped in 10/2013 after flutter ablation.   PFO (patent foramen ovale)    a. Small PFO by TEE 10/2013.   Pleural effusion, left    a. s/p thoracentesis 05/2013.   Respiratory failure ([place])    a. Hypoxia 10/2013 - required supp O2 as inpatient, did not require it at discharge.   S/P Maze operation for atrial fibrillation    a. 04/2013: Complete bilateral atrial lesion set using cryothermy and bipolar radiofrequency ablation with clipping of LA appendage (@ time of CABG)     Allergies  Allergen Reactions   Sunflower Oil Swelling and Other (See Comments)    Tongue and lip swelling Other reaction(s): Other Tongue and lip swelling   Horse-Derived Products Other (See Comments)    Per allergy skin test UNSPECIFIED REACTION  Other reaction(s): Other Per allergy skin test UNSPECIFIED REACTION    Immune Globulin    Tetanus Toxoids Other (See Comments)    Per allergy skin test   Tetanus Toxoid     Other reaction(s): Other (See Comments) Rash(horse serum) Other reaction(s): Other Rash(horse serum)    ROS: all negative except above.   Physical Exam: Filed Weights   06/18/20 1008  Weight: 174 lb (78.9 kg)   BP 114/64   Pulse 66   Temp (!) 96.8 F (36 C)   Wt 174 lb (78.9 kg)   SpO2 99%   BMI 24.27 kg/m   General Appearance: Well nourished, well dressed  elder male, in no apparent distress. Eyes: PERRLA, EOMs, conjunctiva no swelling or erythema Sinuses: No Frontal/maxillary tenderness ENT/Mouth: Ext aud canals clear, TMs without erythema, bulging. No erythema, swelling, or exudate on post pharynx.  Tonsils not swollen or erythematous. Hearing normal.  Neck: Supple, Respiratory: Respiratory effort  normal, BS present in all lobes, diminished bil lobes without rales, rhonchi, wheezing or stridor.  Cardio: RRR with no MRGs. Brisk peripheral pulses without edema.  Abdomen: Soft, + BS.  Non tender, no guarding, rebound, hernias, masses. Lymphatics: Non tender without lymphadenopathy.  Musculoskeletal: no obvious deformity, 5/5 strength, slow steady gait Skin: Warm, dry; very fragile with scattered ecchymosis  Neuro: Normal muscle tone, no cerebellar symptoms. Sensation intact.  Psych: Awake and oriented X 3, normal affect, Insight and Judgment appropriate.     Ashley C Corbett, NP 12:21 PM [facility]    

## 2020-06-17 NOTE — Telephone Encounter (Signed)
Called patient on 06/17/2020 , 3:34 PM in an attempt to reach the patient for a hospital follow up.   Admit date: 06/09/20 Discharge: 06/13/20   He does not have any questions or concerns about medications from the hospital admission. The patient's medications were reviewed over the phone, they were counseled to bring in all current medications to the hospital follow up visit.   I advised the patient to call if any questions or concerns arise about the hospital admission or medications    Home health was not started in the hospital.  All questions were answered and a follow up appointment was made.   Prior to Admission medications   Medication Sig Start Date End Date Taking? Authorizing Provider  ACCU-CHEK AVIVA PLUS test strip CHECK BLOOD GLUCOSE 3 TIMES DAILY 11/26/19   [name], MD  acetaminophen (TYLENOL) 500 MG tablet Take 1,000 mg by mouth every 6 (six) hours as needed for moderate pain or headache.     [provider]  allopurinol (ZYLOPRIM) 300 MG tablet Take 300 mg by mouth daily.    [provider]  ALPRAZolam Duanne Moron) 1 MG tablet Take 0.5 tablets (0.5 mg total) by mouth at bedtime as needed for sleep. 03/23/16   [name], MD  Alum Hydroxide-Mag Carbonate (GAVISCON PO) Take 4 tablets by mouth daily as needed (acid reflux).     [provider]  Ascorbic Acid (VITAMIN C) 1000 MG tablet Take 1,000 mg by mouth daily.    [provider]  aspirin EC 81 MG tablet Take 81 mg by mouth daily.    [provider]  atorvastatin (LIPITOR) 20 MG tablet Take  1 tablet  3 x /week  for Cholesterol 04/21/20   [name], MD  azelastine (ASTELIN) 0.1 % nasal spray Place 2 sprays into both nostrils 2 (two) times daily as needed for rhinitis or allergies. 04/30/20   [name], MD  B Complex Vitamins (VITAMIN B COMPLEX PO) Take 1 tablet by mouth at bedtime.     [provider]  bacitracin 500 UNIT/GM ointment Apply 1 application  topically daily as needed for wound care.    [provider]  Blood Glucose Monitoring Suppl (ACCU-CHEK AVIVA PLUS) w/Device KIT Check blood sugar 1 time  daily 07/12/15   [name], MD  Blood Glucose Monitoring Suppl DEVI Test blood sugar up to three times a day or as directed. 05/24/17   Liane Comber, NP  carvedilol (COREG) 6.25 MG tablet Take 0.5 tablets (3.125 mg total) by mouth in the morning AND 1 tablet (6.25 mg total) every evening. 06/06/19   Bensimhon, Shaune Pascal, MD  Cholecalciferol (VITAMIN D PO) Take 5,000 Units by mouth daily.     [provider]  dapagliflozin propanediol (FARXIGA) 5 MG TABS tablet Take 1 tablet (5 mg total) by mouth daily before breakfast. 05/30/20   Bensimhon, Shaune Pascal, MD  dexamethasone (DECADRON) 4 MG tablet Take 1 tab 3 x day - 3 days, then 2 x day - 3 days, then 1 tab daily 06/14/20   [name], MD  diphenhydrAMINE (BENADRYL) 25 MG tablet Take 25 mg by mouth at bedtime.    [provider]  fluticasone (FLONASE) 50 MCG/ACT nasal spray Place 1 spray into both nostrils daily as needed for allergies. 03/14/20   [name], MD  insulin NPH-regular Human (70-30) 100 UNIT/ML injection Inject 45 Units into the skin daily with breakfast. 35 units in the evening    [provider]  loratadine (CLARITIN) 10 MG tablet Take 10 mg by mouth daily as needed for allergies.     [provider]  Multiple Vitamins-Minerals (PRESERVISION AREDS 2) CAPS Take 1 capsule by mouth 2 (two) times daily.     [provider]  nystatin (MYCOSTATIN) 100000 UNIT/ML suspension Take 5 mLs (500,000 Units total) by mouth 4 (four) times daily. 06/13/20   Kathie Dike, MD  pantoprazole (PROTONIX) 40 MG tablet Take 1 tablet (40 mg total) by mouth 2 (two) times daily before a meal. 06/13/20   Kathie Dike, MD  polyvinyl alcohol (LIQUIFILM TEARS) 1.4 % ophthalmic solution Place 1 drop into both eyes daily as needed for dry eyes.     [provider]  potassium chloride SA (KLOR-CON) 20 MEQ tablet TAKE 2 TABLETS BY MOUTH 3  TIMES DAILY FOR POTASSIUM 03/16/20   Liane Comber, NP  sertraline (ZOLOFT) 100 MG tablet TAKE 1 TABLET BY MOUTH  DAILY FOR MOOD 03/18/20   Bensimhon, Shaune Pascal, MD  torsemide (DEMADEX) 100 MG tablet Take 150 mg by mouth daily. 1.5 tablet    [provider]  warfarin (COUMADIN) 5 MG tablet Take    1.5 tablets /day      or as directed 12/11/19   [name], MD  zinc gluconate 50 MG tablet Take 50 mg by mouth daily.    [provider]    Follow up appointment scheduled with Dr. Melford Aase for June 20th.

## 2020-06-18 ENCOUNTER — Encounter: Payer: Self-pay | Admitting: Adult Health

## 2020-06-18 ENCOUNTER — Other Ambulatory Visit: Payer: Self-pay

## 2020-06-18 ENCOUNTER — Ambulatory Visit (INDEPENDENT_AMBULATORY_CARE_PROVIDER_SITE_OTHER): Payer: Medicare Other | Admitting: Adult Health

## 2020-06-18 VITALS — BP 114/64 | HR 66 | Temp 96.8°F | Wt 174.0 lb

## 2020-06-18 DIAGNOSIS — Z79899 Other long term (current) drug therapy: Secondary | ICD-10-CM

## 2020-06-18 DIAGNOSIS — R791 Abnormal coagulation profile: Secondary | ICD-10-CM | POA: Diagnosis not present

## 2020-06-18 DIAGNOSIS — K31811 Angiodysplasia of stomach and duodenum with bleeding: Secondary | ICD-10-CM

## 2020-06-18 DIAGNOSIS — K2901 Acute gastritis with bleeding: Secondary | ICD-10-CM

## 2020-06-18 DIAGNOSIS — D649 Anemia, unspecified: Secondary | ICD-10-CM | POA: Diagnosis not present

## 2020-06-18 DIAGNOSIS — I480 Paroxysmal atrial fibrillation: Secondary | ICD-10-CM | POA: Diagnosis not present

## 2020-06-18 DIAGNOSIS — E1122 Type 2 diabetes mellitus with diabetic chronic kidney disease: Secondary | ICD-10-CM

## 2020-06-18 DIAGNOSIS — N183 Chronic kidney disease, stage 3 unspecified: Secondary | ICD-10-CM | POA: Diagnosis not present

## 2020-06-19 ENCOUNTER — Other Ambulatory Visit: Payer: Self-pay | Admitting: Adult Health

## 2020-06-19 DIAGNOSIS — D509 Iron deficiency anemia, unspecified: Secondary | ICD-10-CM

## 2020-06-19 LAB — CBC WITH DIFFERENTIAL/PLATELET
Absolute Monocytes: 614 cells/uL (ref 200–950)
Basophils Absolute: 0 cells/uL (ref 0–200)
Basophils Relative: 0 %
Eosinophils Absolute: 0 cells/uL — ABNORMAL LOW (ref 15–500)
Eosinophils Relative: 0 %
HCT: 32.3 % — ABNORMAL LOW (ref 38.5–50.0)
Hemoglobin: 9.7 g/dL — ABNORMAL LOW (ref 13.2–17.1)
Lymphs Abs: 355 cells/uL — ABNORMAL LOW (ref 850–3900)
MCH: 27.3 pg (ref 27.0–33.0)
MCHC: 30 g/dL — ABNORMAL LOW (ref 32.0–36.0)
MCV: 91 fL (ref 80.0–100.0)
MPV: 8.8 fL (ref 7.5–12.5)
Monocytes Relative: 6.4 %
Neutro Abs: 8630 cells/uL — ABNORMAL HIGH (ref 1500–7800)
Neutrophils Relative %: 89.9 %
Platelets: 262 10*3/uL (ref 140–400)
RBC: 3.55 10*6/uL — ABNORMAL LOW (ref 4.20–5.80)
RDW: 15.8 % — ABNORMAL HIGH (ref 11.0–15.0)
Total Lymphocyte: 3.7 %
WBC: 9.6 10*3/uL (ref 3.8–10.8)

## 2020-06-19 LAB — BASIC METABOLIC PANEL WITH GFR
BUN/Creatinine Ratio: 28 (calc) — ABNORMAL HIGH (ref 6–22)
BUN: 63 mg/dL — ABNORMAL HIGH (ref 7–25)
CO2: 30 mmol/L (ref 20–32)
Calcium: 8.8 mg/dL (ref 8.6–10.3)
Chloride: 97 mmol/L — ABNORMAL LOW (ref 98–110)
Creat: 2.24 mg/dL — ABNORMAL HIGH (ref 0.70–1.11)
GFR, Est African American: 31 mL/min/{1.73_m2} — ABNORMAL LOW (ref >=60)
GFR, Est Non African American: 27 mL/min/{1.73_m2} — ABNORMAL LOW (ref >=60)
Glucose, Bld: 361 mg/dL — ABNORMAL HIGH (ref 65–99)
Potassium: 4.3 mmol/L (ref 3.5–5.3)
Sodium: 140 mmol/L (ref 135–146)

## 2020-06-19 LAB — PROTIME-INR
INR: 3.8 — ABNORMAL HIGH
Prothrombin Time: 35.2 s — ABNORMAL HIGH (ref 9.0–11.5)

## 2020-06-19 MED ORDER — FUSION PLUS PO CAPS: 1.0000 | ORAL_CAPSULE | Freq: Every day | ORAL | 2 refills | Status: DC

## 2020-06-24 ENCOUNTER — Inpatient Hospital Stay: Payer: Medicare Other | Admitting: Internal Medicine

## 2020-06-25 ENCOUNTER — Other Ambulatory Visit: Payer: Self-pay | Admitting: Physician Assistant

## 2020-07-01 ENCOUNTER — Other Ambulatory Visit: Payer: Self-pay

## 2020-07-01 ENCOUNTER — Encounter: Payer: Self-pay | Admitting: Internal Medicine

## 2020-07-01 ENCOUNTER — Ambulatory Visit (INDEPENDENT_AMBULATORY_CARE_PROVIDER_SITE_OTHER): Payer: Medicare Other | Admitting: Internal Medicine

## 2020-07-01 VITALS — BP 117/68 | HR 84 | Temp 97.7°F | Resp 17 | Ht 71.0 in | Wt 178.8 lb

## 2020-07-01 DIAGNOSIS — E785 Hyperlipidemia, unspecified: Secondary | ICD-10-CM | POA: Diagnosis not present

## 2020-07-01 DIAGNOSIS — K31811 Angiodysplasia of stomach and duodenum with bleeding: Secondary | ICD-10-CM

## 2020-07-01 DIAGNOSIS — Z79899 Other long term (current) drug therapy: Secondary | ICD-10-CM | POA: Diagnosis not present

## 2020-07-01 DIAGNOSIS — Z794 Long term (current) use of insulin: Secondary | ICD-10-CM

## 2020-07-01 DIAGNOSIS — E1122 Type 2 diabetes mellitus with diabetic chronic kidney disease: Secondary | ICD-10-CM | POA: Diagnosis not present

## 2020-07-01 DIAGNOSIS — E1169 Type 2 diabetes mellitus with other specified complication: Secondary | ICD-10-CM

## 2020-07-01 DIAGNOSIS — Z7901 Long term (current) use of anticoagulants: Secondary | ICD-10-CM

## 2020-07-01 DIAGNOSIS — I480 Paroxysmal atrial fibrillation: Secondary | ICD-10-CM | POA: Diagnosis not present

## 2020-07-01 DIAGNOSIS — I10 Essential (primary) hypertension: Secondary | ICD-10-CM

## 2020-07-01 DIAGNOSIS — E559 Vitamin D deficiency, unspecified: Secondary | ICD-10-CM | POA: Diagnosis not present

## 2020-07-01 DIAGNOSIS — N184 Chronic kidney disease, stage 4 (severe): Secondary | ICD-10-CM | POA: Diagnosis not present

## 2020-07-01 NOTE — Progress Notes (Signed)
Future Appointments  Date Time Provider [place]  07/01/2020  2:30 PM [name], MD [facility] None  07/02/2020  1:00 PM Lincoln Brigham, PA-C [facility] None  07/03/2020  1:30 PM Hayden Pedro, MD [facility] None  07/17/2020  2:00 PM Willia Craze, NP [facility]  09/13/2020 11:40 AM Bensimhon, Shaune Pascal, MD [facility] None  10/23/2020 11:00 AM [name], MD [facility] None  02/26/2021  3:30 PM Liane Comber, NP [facility] None    History of Present Illness:       This very nice 80 y.o. MWM with HTN, ASHD/CABG, chAfib, Pulm. 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 He had a significant drop in Hgb to 7.8 gm% and then down to 6.9 gm% requiring transfusion.  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 Patient returns now for 2sd short post hospital f/u . He denies any GI sx's and specifically no melena or hematochezia.        Patient's current dose of coumadin is 5 mg /daily. He denies any active bleeding but is noted to have ecchymoses of the dorsal forearms.  Patient relates a hematology appt tomorrow at Dr Grier Mitts office for [facility].         Patient is treated for HTN & BP has been controlled at home. Today's BP: is at goal - 117/68. Patient has had no complaints of any cardiac type chest pain, palpitations, dyspnea / orthopnea / PND, postural dizziness, claudication, or dependent edema.        Hyperlipidemia is controlled with diet & meds. Patient denies myalgias or other med SE's. 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  Patient denies  symptoms of reactive diabetic polys, paresthesias or visual blurring.  He does report having some low CBG's in the morning and is advised to taper his evening dose of Insulin.   Recent A1c was not at goal:  Lab Results  Component Value Date   HGBA1C 7.0 (H) 06/10/2020                                                                   Further, the patient also has history of Vitamin D Deficiency  ("39" /2008)   and supplements vitamin D without any suspected side-effects. Last vitamin D was near goal:  Lab Results  Component Value Date   VD25OH 56 02/06/2020     Current Outpatient Medications on File Prior to Visit  Medication Sig   acetaminophen 500 MG tablet Take 1,000 mg every 6 hours as needed    allopurinol 300 MG tablet Take daily.   ALPRAZolam 1 MG tablet Take 0.5 tablets at bedtime as needed for sleep.   GAVISCON  Take 4 tablets daily as needed (acid reflux).    Ascorbic Acid (VITAMIN C) 1000 MG tablet Take 1,000 mg  daily.   atorvastatin 20 MG tablet Take  1 tablet  3 x /week  for Cholesterol   ASTELIN 0.1 % nasal spray Place 2 sprays into both nostrils 2 (two) times daily as needed for rhinitis or allergies.   B Complex Vitamins  Take 1 tablet at bedtime.    bacitracin ointment Apply  daily as needed for wound care.   carvedilol  6.25 MG tablet Take 0.5 tablets in the morning AND 1 tablet (6.25 mg total) every evening.   VITAMIN D Take 5,000 Units  daily.    FARXIGA) 5 MG TABS tablet Take 1 tablet daily before breakfast.   diphenhydrAMINE 25 MG tablet Take at bedtime.   insulin NPH-regular Human (70-30)  Inject 45 Units into the skin daily with breakfast. 35 units in the evening   FUSION PLUS CAPS Take 1 capsule by mouth daily.   loratadine 10 MG tablet Take aily as needed for allergies.    PRESERVISION AREDS 2 Take 1 capsule by mouth 2 (two) times daily.    pantoprazole (PROTONIX) 40 MG tablet Take 1 tablet (40 mg  total) by mouth 2 (two) times daily before a meal.   polyvinyl alcohol (LIQUIFILM TEARS) 1.4 % ophthalmic solution Place 1 drop into both eyes daily as needed for dry eyes.   potassium chloride SA (KLOR-CON) 20 MEQ tablet TAKE 2 TABLETS BY MOUTH 3  TIMES DAILY FOR POTASSIUM   sertraline (ZOLOFT) 100 MG tablet TAKE 1 TABLET BY MOUTH  DAILY FOR MOOD   torsemide (DEMADEX) 100 MG tablet Take 150 mg by mouth daily. 1.5 tablet   warfarin (COUMADIN) 5 MG tablet Take    1 tablets /day      or as directed   zinc gluconate 50 MG tablet Take 50 mg by mouth daily.     Allergies  Allergen Reactions   Sunflower Oil Swelling     Tongue and lip swelling Other reaction(s): Other Tongue and lip swelling   Horse-Derived Products     Per allergy skin test UNSPECIFIED REACTION  Other reaction(s): Other Per allergy skin test UNSPECIFIED REACTION    Immune Globulin    Tetanus Toxoids     Per allergy skin test   Tetanus Toxoid     Rash(horse serum)    PMHx:   Past Medical History:  Diagnosis Date   Adrenal adenoma    Arthritis    Atypical atrial flutter ([place]) 8/15, 10/15   a. DCCV 08/2013. b. s/p RFA 10/2013.   Basal cell carcinoma    CAD (coronary artery disease)    a. 04/2013 CABG x 2: LIMA to LAD, SVG to RI, EVH via R thigh.   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 s/p MAZE 04/2013 in setting of CABG. b. Amio stopped in 10/2013 after flutter ablation.   PFO (patent foramen ovale)    a. Small PFO by TEE 10/2013.   Pleural effusion, left    a. s/p thoracentesis 05/2013.   Respiratory failure ([place])    a. Hypoxia 10/2013 - required supp O2 as inpatient, did not require it at discharge.   S/P Maze operation for atrial fibrillation    a. 04/2013: Complete bilateral atrial lesion set using cryothermy and bipolar radiofrequency ablation with clipping of LA appendage (@ time of CABG)     Immunization History  Administered Date(s) Administered   DT (Pediatric) 08/05/2015   Influenza Split 10/25/2012   Influenza, High Dose  11/11/2017, 10/10/2018, 10/11/2019   Influenza-Unspecified 10/05/2016, 09/22/2017   PFIZER  SARS-COV-2 Vacc 01/26/2019, 02/15/2019   Pneumococcal Conjugate-13 01/30/2014   Pneumococcal Polysaccharide-23 10/20/2011   Td 01/06/2000     Past Surgical History:  Procedure Laterality Date   APPENDECTOMY  03/05/2017   laproscopic   ATRIAL FIBRILLATION ABLATION N/A 10/26/2013   Procedure: ATRIAL FIBRILLATION ABLATION;  Surgeon: Coralyn Mark, MD;  Location: [facility];  Service: Cardiovascular;  Laterality: N/A;   BASAL CELL CARCINOMA EXCISION     x3 on face   BIOPSY  06/12/2020   Procedure: BIOPSY;  Surgeon: Jerene Bears, MD;  Location: [facility];  Service: Gastroenterology;;   CARDIAC CATHETERIZATION     myocardial bridge but no cad   CARDIOVERSION N/A 08/23/2013   Procedure: CARDIOVERSION;  Surgeon: Sanda Klein, MD;  Location: [facility];  Service: Cardiovascular;  Laterality: N/A;   CARPOMETACARPEL SUSPENSION PLASTY Left 02/14/2014   Procedure: CARPOMETACARPEL ([place]) SUSPENSIONPLASTY THUMB  WITH  ABDUCTOR POLLICIS LONGUS TRANSFER AND STENOSING TENOSYNOVITIS RELEASE LEFT WRIST;  Surgeon: Charlotte Crumb, MD;  Location: [facility];  Service: Orthopedics;  Laterality:  Left;   CATARACT EXTRACTION Bilateral    COLONOSCOPY WITH PROPOFOL N/A 01/26/2017   Procedure: COLONOSCOPY WITH PROPOFOL;  Surgeon: Doran Stabler, MD;  Location: [facility];  Service: Gastroenterology;  Laterality: N/A;   CORONARY ARTERY BYPASS GRAFT N/A 04/05/2013   Procedure: CORONARY ARTERY BYPASS GRAFTING (CABG) TIMES TWO USING LEFT INTERNAL MAMMARY ARTERY AND RIGHT SAPHENOUS LEG VEIN HARVESTED ENDOSCOPICALLY;  Surgeon: Rexene Alberts, MD;  Location: [place];  Service: Open Heart Surgery;  Laterality: N/A;   ENTEROSCOPY N/A 06/12/2020   Procedure: ENTEROSCOPY;  Surgeon: Jerene Bears, MD;  [facility]: [facility];  Service: Gastroenterology;  Laterality: N/A;   ESOPHAGOGASTRODUODENOSCOPY (EGD) WITH PROPOFOL N/A 01/26/2017   Procedure: ESOPHAGOGASTRODUODENOSCOPY (EGD) WITH PROPOFOL;  Surgeon: Doran Stabler, MD;  [facility];  Service: Gastroenterology;  Laterality: N/A;   EYE SURGERY Bilateral    Cat Sx   EYE SURGERY Right    RD repair - SB   GREAT TOE ARTHRODESIS, INTERPHALANGEAL JOINT     Right foot   HOT HEMOSTASIS N/A 06/12/2020   Procedure: HOT HEMOSTASIS (ARGON PLASMA COAGULATION/BICAP);  Surgeon: Jerene Bears, MD;  Location: [facility];  Service: Gastroenterology;  Laterality: N/A;   INTRAOPERATIVE TRANSESOPHAGEAL ECHOCARDIOGRAM N/A 04/05/2013   Procedure: INTRAOPERATIVE TRANSESOPHAGEAL ECHOCARDIOGRAM;  Surgeon: Rexene Alberts, MD;  Location: [facility];  Service: Open Heart Surgery;  Laterality: N/A;   LAPAROSCOPIC APPENDECTOMY N/A 03/05/2017   Procedure: APPENDECTOMY LAPAROSCOPIC;  Surgeon: Judeth Horn, MD;  [facility];  Service: General;  Laterality: N/A;   LEFT HEART CATHETERIZATION WITH CORONARY ANGIOGRAM N/A 03/07/2013   Procedure: LEFT HEART CATHETERIZATION WITH CORONARY ANGIOGRAM;  Surgeon: Burnell Blanks, MD;  Location: [facility] C[facility] LAB;  Service: Cardiovascular;  Laterality: N/A;   MAZE N/A 04/05/2013   Procedure: MAZE;  Surgeon: Rexene Alberts, MD;   Location: [facility];  Service: Open Heart Surgery;  Laterality: N/A;   Polinydal cyst     Removed   POLYPECTOMY     Retina repair-right     RETINAL DETACHMENT SURGERY Right    RD Repair - SB   RIGHT HEART CATH N/A 07/06/2019   Procedure: RIGHT HEART CATH;  Surgeon: Jolaine Artist, MD;  Location: [facility] LAB;  Service: Cardiovascular;  Laterality: N/A;   RIGHT HEART CATHETERIZATION N/A 05/03/2014   Procedure: RIGHT HEART CATH;  Surgeon: Jolaine Artist, MD;  Location: [facility] CATH LAB;  Service: Cardiovascular;  Laterality: N/A;   TEE WITHOUT CARDIOVERSION N/A 08/23/2013   Procedure: TRANSESOPHAGEAL ECHOCARDIOGRAM (TEE);  Surgeon: Sanda Klein, MD;  Location: [facility] ENDOSCOPY;  Service: Cardiovascular;  Laterality: N/A;   TEE WITHOUT CARDIOVERSION N/A 10/26/2013   Procedure: TRANSESOPHAGEAL ECHOCARDIOGRAM (TEE);  Surgeon: Sueanne Margarita, MD;  Location: [facility] ENDOSCOPY;  Service: Cardiovascular;  Laterality: N/A;   TEE WITHOUT CARDIOVERSION N/A 06/05/2014   Procedure: TRANSESOPHAGEAL ECHOCARDIOGRAM (TEE);  Surgeon: Thayer Headings, MD;  Location: [facility] ENDOSCOPY;  Service: Cardiovascular;  Laterality: N/A;   TRAPEZIUM RESECTION      FHx:    Reviewed / unchanged  SHx:    Reviewed / unchanged   Systems Review:  Constitutional: Denies fever, chills, wt changes, headaches, insomnia, fatigue, night sweats, change in appetite. Eyes: Denies redness, blurred vision, diplopia, discharge, itchy, watery eyes.  ENT: Denies discharge, congestion, post nasal drip, epistaxis, sore throat, earache, hearing loss, dental pain, tinnitus, vertigo, sinus pain, snoring.  CV: Denies chest pain, palpitations, irregular heartbeat, syncope, dyspnea, diaphoresis, orthopnea, PND, claudication or edema. Respiratory: denies cough, dyspnea, DOE, pleurisy, hoarseness, laryngitis, wheezing.  Gastrointestinal: Denies dysphagia, odynophagia, heartburn, reflux, water brash, abdominal pain or cramps, nausea, vomiting, bloating,  diarrhea, constipation, hematemesis, melena, hematochezia  or hemorrhoids. Genitourinary: Denies dysuria, frequency, urgency, nocturia, hesitancy, discharge, hematuria or flank pain. Musculoskeletal: Denies arthralgias, myalgias, stiffness, jt. swelling, pain, limping or strain/sprain.  Skin: Denies pruritus, rash, hives, warts, acne, eczema or change in skin lesion(s). Neuro: No weakness, tremor, incoordination, spasms, paresthesia or pain. Psychiatric: Denies confusion, memory loss or sensory loss. Endo: Denies change in weight, skin or hair change.  Heme/Lymph: No excessive bleeding, bruising or enlarged lymph nodes.  Physical Exam  BP 117/68   Pulse 84   Temp 97.7 F (36.5 C)   Resp 17   Ht 5\' 11"  (1.803 m)   Wt 178 lb 12.8 oz (81.1 kg)   SpO2 99%   BMI 24.94 kg/m   Appears  well nourished, well groomed  and in no distress.  Eyes: PERRLA, EOMs, conjunctiva no swelling or erythema. Sinuses: No frontal/maxillary tenderness ENT/Mouth: EAC's clear, TM's nl w/o erythema, bulging. Nares clear w/o erythema, swelling, exudates. Oropharynx clear without erythema or exudates. Oral hygiene is good. Tongue normal, non obstructing. Hearing intact.  Neck: Supple. Thyroid not palpable. Car 2+/2+ without bruits, nodes or JVD. Chest: Respirations nl with BS clear & equal w/o rales, rhonchi, wheezing or stridor.  Cor: Heart sounds normal w/ regular rate and rhythm without sig. murmurs, gallops, clicks or rubs. Peripheral pulses normal and equal  without edema.  Abdomen: Soft & bowel sounds normal. Non-tender w/o guarding, rebound, hernias, masses or organomegaly.  Lymphatics: Unremarkable.  Musculoskeletal: Full ROM all peripheral extremities, joint stability, 5/5 strength and normal gait.  Skin: Warm, dry without exposed rashes, lesions or ecchymosis apparent.  Neuro: Cranial nerves intact, reflexes equal bilaterally. Sensory-motor testing grossly intact. Tendon reflexes grossly intact.  Pysch:  Alert & oriented x 3.  Insight and judgement nl & appropriate. No ideations.  Assessment and Plan:  1. Essential hypertension  - Continue medication, monitor blood pressure at home.  - Continue DASH diet.  Reminder to go to the ER if any CP,  SOB, nausea, dizziness, severe HA, changes vision/speech.   - CBC with Differential/Platelet - COMPLETE METABOLIC PANEL WITH GFR - TSH  2. Hyperlipidemia associated with type 2 diabetes mellitus ([place])  - Continue diet/meds, exercise,& lifestyle modifications.  - Continue monitor periodic cholesterol/liver & renal functions    - Lipid panel - TSH  3. Type 2 diabetes mellitus with stage 4 chronic kidney  disease, with long-term current use of insulin ([place])   4. Vitamin D deficiency  - Continue diet, exercise  - Lifestyle modifications.  - Monitor appropriate labs. - Continue supplementation.    - VITAMIN D 25 Hydroxy  5. PAF (paroxysmal atrial fibrillation) ([place])  - Protime-INR  6. 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 Chronic anticoagulation  - Protime-INR  8. Medication management  - CBC with Differential/Platelet - COMPLETE METABOLIC PANEL WITH GFR - VITAMIN D 25 Hydroxy  - Lipid panel - TSH         Discussed  regular exercise, BP monitoring, weight control to achieve/maintain BMI less than 25 and discussed med and SE's. Recommended labs to assess and monitor clinical status with further disposition pending results of labs.  I discussed the assessment and treatment plan with the patient. The patient was provided an opportunity to ask questions and all were answered. The patient agreed with the plan and demonstrated an understanding of the instructions.  I provided over 30 minutes of exam, counseling, chart review and  complex critical decision making.        The patient was advised to call back or seek an in-person evaluation if the  symptoms worsen or if the condition fails to improve as anticipated.   Kirtland Bouchard, MD

## 2020-07-02 ENCOUNTER — Other Ambulatory Visit: Payer: Self-pay | Admitting: *Deleted

## 2020-07-02 ENCOUNTER — Inpatient Hospital Stay: Payer: Medicare Other | Attending: Hematology

## 2020-07-02 ENCOUNTER — Inpatient Hospital Stay: Payer: Medicare Other

## 2020-07-02 ENCOUNTER — Telehealth: Payer: Self-pay | Admitting: Internal Medicine

## 2020-07-02 ENCOUNTER — Inpatient Hospital Stay (HOSPITAL_BASED_OUTPATIENT_CLINIC_OR_DEPARTMENT_OTHER): Payer: Medicare Other | Admitting: Physician Assistant

## 2020-07-02 VITALS — BP 116/67 | HR 66 | Temp 98.2°F | Resp 19 | Ht 71.0 in | Wt 179.0 lb

## 2020-07-02 DIAGNOSIS — D509 Iron deficiency anemia, unspecified: Secondary | ICD-10-CM

## 2020-07-02 DIAGNOSIS — D631 Anemia in chronic kidney disease: Secondary | ICD-10-CM | POA: Diagnosis not present

## 2020-07-02 DIAGNOSIS — N183 Chronic kidney disease, stage 3 unspecified: Secondary | ICD-10-CM | POA: Diagnosis not present

## 2020-07-02 DIAGNOSIS — D508 Other iron deficiency anemias: Secondary | ICD-10-CM

## 2020-07-02 LAB — LIPID PANEL
Cholesterol: 133 mg/dL (ref <200)
HDL: 31 mg/dL — ABNORMAL LOW (ref >=40)
LDL Cholesterol (Calc): 75 mg/dL (calc)
Non-HDL Cholesterol (Calc): 102 mg/dL (calc) (ref <130)
Total CHOL/HDL Ratio: 4.3 (calc) (ref <5.0)
Triglycerides: 176 mg/dL — ABNORMAL HIGH (ref <150)

## 2020-07-02 LAB — CBC WITH DIFFERENTIAL/PLATELET
Absolute Monocytes: 765 cells/uL (ref 200–950)
Basophils Absolute: 18 cells/uL (ref 0–200)
Basophils Relative: 0.2 %
Eosinophils Absolute: 160 cells/uL (ref 15–500)
Eosinophils Relative: 1.8 %
HCT: 30.6 % — ABNORMAL LOW (ref 38.5–50.0)
Hemoglobin: 9.7 g/dL — ABNORMAL LOW (ref 13.2–17.1)
Lymphs Abs: 659 cells/uL — ABNORMAL LOW (ref 850–3900)
MCH: 27.5 pg (ref 27.0–33.0)
MCHC: 31.7 g/dL — ABNORMAL LOW (ref 32.0–36.0)
MCV: 86.7 fL (ref 80.0–100.0)
MPV: 9 fL (ref 7.5–12.5)
Monocytes Relative: 8.6 %
Neutro Abs: 7298 cells/uL (ref 1500–7800)
Neutrophils Relative %: 82 %
Platelets: 246 10*3/uL (ref 140–400)
RBC: 3.53 10*6/uL — ABNORMAL LOW (ref 4.20–5.80)
RDW: 17.3 % — ABNORMAL HIGH (ref 11.0–15.0)
Total Lymphocyte: 7.4 %
WBC: 8.9 10*3/uL (ref 3.8–10.8)

## 2020-07-02 LAB — RETIC PANEL
Immature Retic Fract: 32.4 % — ABNORMAL HIGH (ref 2.3–15.9)
RBC.: 3.4 MIL/uL — ABNORMAL LOW (ref 4.22–5.81)
Retic Count, Absolute: 116.5 10*3/uL (ref 19.0–186.0)
Retic Ct Pct: 3.4 % — ABNORMAL HIGH (ref 0.4–3.1)
Reticulocyte Hemoglobin: 26 pg — ABNORMAL LOW (ref >27.9)

## 2020-07-02 LAB — VITAMIN D 25 HYDROXY (VIT D DEFICIENCY, FRACTURES): Vit D, 25-Hydroxy: 49 ng/mL (ref 30–100)

## 2020-07-02 LAB — PROTIME-INR
INR: 1.7 — ABNORMAL HIGH
Prothrombin Time: 17.2 s — ABNORMAL HIGH (ref 9.0–11.5)

## 2020-07-02 LAB — IRON AND TIBC
Iron: 103 ug/dL (ref 42–163)
Saturation Ratios: 32 % (ref 20–55)
TIBC: 325 ug/dL (ref 202–409)
UIBC: 222 ug/dL (ref 117–376)

## 2020-07-02 LAB — COMPLETE METABOLIC PANEL WITH GFR
AG Ratio: 1.6 (calc) (ref 1.0–2.5)
ALT: 23 U/L (ref 9–46)
AST: 18 U/L (ref 10–35)
Albumin: 3.8 g/dL (ref 3.6–5.1)
Alkaline phosphatase (APISO): 137 U/L (ref 35–144)
BUN/Creatinine Ratio: 15 (calc) (ref 6–22)
BUN: 30 mg/dL — ABNORMAL HIGH (ref 7–25)
CO2: 28 mmol/L (ref 20–32)
Calcium: 8.6 mg/dL (ref 8.6–10.3)
Chloride: 104 mmol/L (ref 98–110)
Creat: 1.96 mg/dL — ABNORMAL HIGH (ref 0.70–1.11)
GFR, Est African American: 36 mL/min/{1.73_m2} — ABNORMAL LOW (ref >=60)
GFR, Est Non African American: 31 mL/min/{1.73_m2} — ABNORMAL LOW (ref >=60)
Globulin: 2.4 g/dL (calc) (ref 1.9–3.7)
Glucose, Bld: 108 mg/dL — ABNORMAL HIGH (ref 65–99)
Potassium: 4.8 mmol/L (ref 3.5–5.3)
Sodium: 140 mmol/L (ref 135–146)
Total Bilirubin: 0.6 mg/dL (ref 0.2–1.2)
Total Protein: 6.2 g/dL (ref 6.1–8.1)

## 2020-07-02 LAB — FERRITIN: Ferritin: 55 ng/mL (ref 24–336)

## 2020-07-02 LAB — TSH: TSH: 2.07 mIU/L (ref 0.40–4.50)

## 2020-07-02 MED ORDER — FAMOTIDINE 20 MG PO TABS
ORAL_TABLET | ORAL | Status: AC
  Filled 2020-07-02: qty 1

## 2020-07-02 MED ORDER — LORATADINE 10 MG PO TABS
10.0000 mg | ORAL_TABLET | Freq: Every day | ORAL | Status: DC
  Administered 2020-07-02: 10 mg via ORAL

## 2020-07-02 MED ORDER — ACETAMINOPHEN 325 MG PO TABS
ORAL_TABLET | ORAL | Status: AC
  Filled 2020-07-02: qty 2

## 2020-07-02 MED ORDER — FAMOTIDINE 20 MG PO TABS: 20.0000 mg | ORAL_TABLET | Freq: Once | ORAL | Status: DC

## 2020-07-02 MED ORDER — ACETAMINOPHEN 325 MG PO TABS
650.0000 mg | ORAL_TABLET | Freq: Once | ORAL | Status: AC
  Administered 2020-07-02: 650 mg via ORAL

## 2020-07-02 MED ORDER — LORATADINE 10 MG PO TABS
ORAL_TABLET | ORAL | Status: AC
  Filled 2020-07-02: qty 1

## 2020-07-02 MED ORDER — SODIUM CHLORIDE 0.9 % IV SOLN
750.0000 mg | Freq: Once | INTRAVENOUS | Status: AC
  Administered 2020-07-02: 750 mg via INTRAVENOUS
  Filled 2020-07-02: qty 15

## 2020-07-02 NOTE — Progress Notes (Signed)
HEMATOLOGY/ONCOLOGY CONSULTATION NOTE  Date of Service: 07/02/2020  Patient Care Team: [name], MD as PCP - General (Internal Medicine) Inda Castle, MD (Inactive) as Consulting Physician (Gastroenterology) Martinique, Peter M, MD as Consulting Physician (Cardiology) Rexene Alberts, MD as Consulting Physician (Cardiothoracic Surgery) Szott, Prescott Gum, DDS as Referring Physician Hayden Pedro, MD as Consulting Physician (Ophthalmology) Lavonna Monarch, MD as Consulting Physician (Dermatology) Josue Hector, MD as Consulting Physician (Cardiology) Thompson Grayer, MD as Consulting Physician (Cardiology) Rosita Fire, MD as Consulting Physician (Nephrology) Bensimhon, Shaune Pascal, MD as Consulting Physician (Cardiology) Rush Landmark, [facility] as Pharmacist (Pharmacist)  CHIEF COMPLAINT:Iron deficiency anemia  INTERVAL HISTORY: [name]. Returns today for a follow up for chronic anemia. The patient's last visit with [place] was on 03/13/2020. Since then, patient was admitted from 06/09/2020-06/13/2020 for GI bleed. Patient underwent EGD that revealed gastric AVMs that were treated with APC.   On exam today, Mr. Picou reports that hsi energy levels are slowly improving. He is able to complete his ADLs on his own. His appetite is stable without any weight changes. He denies any nausea, vomiting or abdominal pain. He denies any changes to his bowel habits. Patient notes dark stools after starting oral iron in the last couple of weeks. Patient denies any tarry stools or bright red blood in the stool. Patient notes that his gait is unsteady and requires a cane to ambulate. He denies recent fall since hospital discharge. He has chronic shortness of breath with exertion but none at rest. Patient denies any fevers, chills, night sweats, chest pain or cough. He has no other complaints.   MEDICAL HISTORY:  Past Medical History:  Diagnosis Date   Adrenal adenoma    Arthritis     Atypical atrial flutter ([place]) 8/15, 10/15   a. DCCV 08/2013. b. s/p RFA 10/2013.   Basal cell carcinoma    CAD (coronary artery disease)    a. 04/2013 CABG x 2: LIMA to LAD, SVG to RI, EVH via R thigh.   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 s/p MAZE 04/2013 in setting of CABG. b. Amio stopped in 10/2013 after flutter ablation.   PFO (patent foramen ovale)    a. Small PFO by TEE 10/2013.   Pleural effusion, left    a. s/p thoracentesis 05/2013.   Respiratory failure ([place])    a. Hypoxia 10/2013 - required supp O2 as inpatient, did not require it at discharge.   S/P Maze operation for atrial fibrillation    a. 04/2013: Complete bilateral atrial lesion set using cryothermy and bipolar radiofrequency ablation with clipping of LA appendage (@ time of CABG)    SURGICAL HISTORY: Past  Surgical History:  Procedure Laterality Date   APPENDECTOMY  03/05/2017   laproscopic   ATRIAL FIBRILLATION ABLATION N/A 10/26/2013   Procedure: ATRIAL FIBRILLATION ABLATION;  Surgeon: Coralyn Mark, MD;  Location: [place] [facility];  Service: Cardiovascular;  Laterality: N/A;   BASAL CELL CARCINOMA EXCISION     x3 on face   BIOPSY  06/12/2020   Procedure: BIOPSY;  Surgeon: Jerene Bears, MD;  Location: [facility];  Service: Gastroenterology;;    CARDIAC CATHETERIZATION     myocardial bridge but no cad   CARDIOVERSION N/A 08/23/2013   Procedure: CARDIOVERSION;  Surgeon: Sanda Klein, MD;  Location: [place];  Service: Cardiovascular;  Laterality: N/A;   CARPOMETACARPEL SUSPENSION PLASTY Left 02/14/2014   Procedure: CARPOMETACARPEL ([place]) SUSPENSIONPLASTY THUMB  WITH  ABDUCTOR POLLICIS LONGUS TRANSFER AND STENOSING TENOSYNOVITIS RELEASE LEFT WRIST;  Surgeon: Charlotte Crumb, MD;  Location: [place];  Service: Orthopedics;  Laterality: Left;   CATARACT EXTRACTION Bilateral    COLONOSCOPY WITH PROPOFOL N/A 01/26/2017   Procedure: COLONOSCOPY WITH PROPOFOL;  Surgeon: Doran Stabler, MD;  Location: [facility];  Service: Gastroenterology;  Laterality: N/A;   CORONARY ARTERY BYPASS GRAFT N/A 04/05/2013   Procedure: CORONARY ARTERY BYPASS GRAFTING (CABG) TIMES TWO USING LEFT INTERNAL MAMMARY ARTERY AND RIGHT SAPHENOUS LEG VEIN HARVESTED ENDOSCOPICALLY;  Surgeon: Rexene Alberts, MD;  Location: [place];  Service: Open Heart Surgery;  Laterality: N/A;   ENTEROSCOPY N/A 06/12/2020   Procedure: ENTEROSCOPY;  Surgeon: Jerene Bears, MD;  Location: [facility];  Service: Gastroenterology;  Laterality: N/A;   ESOPHAGOGASTRODUODENOSCOPY (EGD) WITH PROPOFOL N/A 01/26/2017   Procedure: ESOPHAGOGASTRODUODENOSCOPY (EGD) WITH PROPOFOL;  Surgeon: Doran Stabler, MD;  Location: [facility];  Service: Gastroenterology;  Laterality: N/A;   EYE SURGERY Bilateral    Cat Sx   EYE SURGERY Right    RD repair - SB   GREAT TOE ARTHRODESIS, INTERPHALANGEAL JOINT     Right foot   HOT HEMOSTASIS N/A 06/12/2020   Procedure: HOT HEMOSTASIS (ARGON PLASMA COAGULATION/BICAP);  Surgeon: Jerene Bears, MD;  Location: [facility];  Service: Gastroenterology;  Laterality: N/A;   INTRAOPERATIVE TRANSESOPHAGEAL ECHOCARDIOGRAM N/A 04/05/2013   Procedure: INTRAOPERATIVE TRANSESOPHAGEAL ECHOCARDIOGRAM;  Surgeon: Rexene Alberts, MD;  Location: [place];   Service: Open Heart Surgery;  Laterality: N/A;   LAPAROSCOPIC APPENDECTOMY N/A 03/05/2017   Procedure: APPENDECTOMY LAPAROSCOPIC;  Surgeon: Judeth Horn, MD;  Location: [place];  Service: General;  Laterality: N/A;   LEFT HEART CATHETERIZATION WITH CORONARY ANGIOGRAM N/A 03/07/2013   Procedure: LEFT HEART CATHETERIZATION WITH CORONARY ANGIOGRAM;  Surgeon: Burnell Blanks, MD;  Location[facility];  Service: Cardiovascular;  Laterality: N/A;   MAZE N/A 04/05/2013   Procedure: MAZE;  Surgeon: Rexene Alberts, MD;  Location: [place];  Service: Open Heart Surgery;  Laterality: N/A;   Polinydal cyst     Removed   POLYPECTOMY     Retina repair-right     RETINAL DETACHMENT SURGERY Right    RD Repair - SB   RIGHT HEART CATH N/A 07/06/2019   Procedure: RIGHT HEART CATH;  Surgeon: Jolaine Artist, MD;  Location: [place] CV LAB;  Service: Cardiovascular;  Laterality: N/A;   RIGHT HEART CATHETERIZATION N/A 05/03/2014   Procedure: RIGHT HEART CATH;  Surgeon: Jolaine Artist, MD;  Location: [facility] CATH LAB;  Service: Cardiovascular;  Laterality: N/A;   TEE WITHOUT CARDIOVERSION N/A 08/23/2013   Procedure: TRANSESOPHAGEAL ECHOCARDIOGRAM (TEE);  Surgeon: Sanda Klein, MD;  Location: [facility];  Service: Cardiovascular;  Laterality: N/A;   TEE WITHOUT CARDIOVERSION N/A 10/26/2013   Procedure: TRANSESOPHAGEAL ECHOCARDIOGRAM (TEE);  Surgeon: Sueanne Margarita, MD;  Location: [facility] ENDOSCOPY;  Service: Cardiovascular;  Laterality: N/A;   TEE WITHOUT CARDIOVERSION N/A 06/05/2014   Procedure: TRANSESOPHAGEAL ECHOCARDIOGRAM (TEE);  Surgeon: Thayer Headings, MD;  Location: [facility] ENDOSCOPY;  Service: Cardiovascular;  Laterality: N/A;   TRAPEZIUM RESECTION      SOCIAL HISTORY: Social History   Socioeconomic History   Marital status: Married    Spouse name: Not on file   Number of children: 1   Years of education: Not on file   Highest education level: Not on file  Occupational History   Occupation: retired  Software engineer  Tobacco Use   Smoking status: Former    Packs/day: 4.00    Years: 25.00    Pack years: 100.00    Types: Cigarettes    Quit date: 01/05/1981    Years since quitting: 39.5   Smokeless tobacco: Never  Vaping Use   Vaping Use: Never used  Substance and Sexual Activity   Alcohol use: Yes    Alcohol/week: 1.0 standard drink    Types: 1 Shots of liquor per week    Comment: 1-5 drinks per week   Drug use: No   Sexual activity: Yes  Other Topics Concern   Not on file  Social History Narrative   Daily caffeine-yes   Patient gets regular exercise.   Pt lives in [place] with spouse.  Retired Software engineer.  Social Determinants of Health   Financial Resource Strain: Not on file  Food Insecurity: Not on file  Transportation Needs: Not on file  Physical Activity: Not on file  Stress: Not on file  Social  Connections: Not on file  Intimate Partner Violence: Not on file    FAMILY HISTORY: Family History  Problem Relation Age of Onset   Dementia Father    Colon cancer Mother        Family History/Uncle    Colon polyps Mother        Family History   Atrial fibrillation Mother    Hypertension Mother    Colon polyps Sister        Family history   Diabetes Maternal Uncle    Stroke Paternal Uncle     ALLERGIES:  is allergic to sunflower oil, horse-derived products, immune globulin, tetanus toxoids, and tetanus toxoid.  MEDICATIONS:  Current Outpatient Medications  Medication Sig Dispense Refill   ACCU-CHEK AVIVA PLUS test strip CHECK BLOOD GLUCOSE 3 TIMES DAILY 300 strip 3   acetaminophen (TYLENOL) 500 MG tablet Take 1,000 mg by mouth every 6 (six) hours as needed for moderate pain or headache.      allopurinol (ZYLOPRIM) 300 MG tablet Take 300 mg by mouth daily.     ALPRAZolam (XANAX) 1 MG tablet Take 0.5 tablets (0.5 mg total) by mouth at bedtime as needed for sleep. 60 tablet 0   Alum Hydroxide-Mag Carbonate (GAVISCON PO) Take 4 tablets by mouth daily as needed (acid reflux).      Ascorbic Acid (VITAMIN C) 1000 MG tablet Take 1,000 mg by mouth daily.     atorvastatin (LIPITOR) 20 MG tablet Take  1 tablet  3 x /week  for Cholesterol 39 tablet 3   azelastine (ASTELIN) 0.1 % nasal spray Place 2 sprays into both nostrils 2 (two) times daily as needed for rhinitis or allergies. 30 mL 3   B Complex Vitamins (VITAMIN B COMPLEX PO) Take 1 tablet by mouth at bedtime.      bacitracin 500 UNIT/GM ointment Apply 1 application topically daily as needed for wound care.     Blood Glucose Monitoring Suppl (ACCU-CHEK AVIVA PLUS) w/Device KIT Check blood sugar 1 time  daily 1 kit 0   Blood Glucose Monitoring Suppl DEVI Test blood sugar up to three times a day or as directed. 1 each 0   carvedilol (COREG) 6.25 MG tablet Take 0.5 tablets (3.125 mg total) by mouth in the morning AND 1 tablet (6.25 mg  total) every evening. 270 tablet 1   Cholecalciferol (VITAMIN D PO) Take 5,000 Units by mouth daily.      dapagliflozin propanediol (FARXIGA) 5 MG TABS tablet Take 1 tablet (5 mg total) by mouth daily before breakfast. 30 tablet 6   dexamethasone (DECADRON) 4 MG tablet Take 1 tab 3 x day - 3 days, then 2 x day - 3 days, then 1 tab daily 20 tablet 0   diphenhydrAMINE (BENADRYL) 25 MG tablet Take 25 mg by mouth at bedtime.     fluticasone (FLONASE) 50 MCG/ACT nasal spray Place 1 spray into both nostrils daily as needed for allergies. 16 g 3   insulin NPH-regular Human (70-30) 100 UNIT/ML injection Inject 45 Units into the skin daily with breakfast. 35 units in the evening     Iron-FA-B Cmp-C-Biot-Probiotic (FUSION PLUS) CAPS Take 1 capsule by mouth daily. 30 capsule 2   loratadine (CLARITIN) 10 MG tablet Take 10 mg by mouth daily as  needed for allergies.      Multiple Vitamins-Minerals (PRESERVISION AREDS 2) CAPS Take 1 capsule by mouth 2 (two) times daily.      nystatin (MYCOSTATIN) 100000 UNIT/ML suspension Take 5 mLs (500,000 Units total) by mouth 4 (four) times daily. (Patient not taking: Reported on 07/01/2020) 60 mL 0   pantoprazole (PROTONIX) 40 MG tablet Take 1 tablet (40 mg total) by mouth 2 (two) times daily before a meal. 60 tablet 0   polyvinyl alcohol (LIQUIFILM TEARS) 1.4 % ophthalmic solution Place 1 drop into both eyes daily as needed for dry eyes.     potassium chloride SA (KLOR-CON) 20 MEQ tablet TAKE 2 TABLETS BY MOUTH 3  TIMES DAILY FOR POTASSIUM 540 tablet 3   sertraline (ZOLOFT) 100 MG tablet TAKE 1 TABLET BY MOUTH  DAILY FOR MOOD 90 tablet 3   torsemide (DEMADEX) 100 MG tablet Take 150 mg by mouth daily. 1.5 tablet     warfarin (COUMADIN) 5 MG tablet Take    1.5 tablets /day      or as directed 135 tablet 3   zinc gluconate 50 MG tablet Take 50 mg by mouth daily.     No current facility-administered medications for this visit.    REVIEW OF SYSTEMS:   Review of Systems   Constitutional:  Positive for malaise/fatigue. Negative for chills, fever and weight loss.  Respiratory:  Positive for shortness of breath (with exertion). Negative for cough.   Cardiovascular:  Negative for chest pain, palpitations and leg swelling.  Gastrointestinal:  Negative for abdominal pain, blood in stool, constipation, diarrhea, melena, nausea and vomiting.  Skin:  Negative for rash.  Neurological:  Negative for dizziness and headaches.   PHYSICAL EXAMINATION: ECOG PERFORMANCE STATUS: 2 - Symptomatic, <50% confined to bed  . Vitals:   07/02/20 1252  BP: 116/67  Pulse: 66  Resp: 19  Temp: 98.2 F (36.8 C)  SpO2: 98%   Filed Weights   07/02/20 1252  Weight: 179 lb (81.2 kg)   .Body mass index is 24.97 kg/m. Exam was given in a chair.   GENERAL:alert, in no acute distress and comfortable SKIN: no acute rashes, no significant lesions EYES: conjunctiva are pink and non-injected, sclera anicteric OROPHARYNX: MMM, no exudates, no oropharyngeal erythema or ulceration NECK: supple, no JVD LYMPH:  no palpable lymphadenopathy in the cervical, axillary or inguinal regions LUNGS: clear to auscultation b/l with normal respiratory effort HEART: regular rate & rhythm ABDOMEN:  normoactive bowel sounds , non tender, not distended. Extremity: no pedal edema PSYCH: alert & oriented x 3 with fluent speech NEURO: no focal motor/sensory deficits   LABORATORY DATA:  I have reviewed the data as listed  . CBC Latest Ref Rng & Units 07/01/2020 06/18/2020 06/13/2020  WBC 3.8 - 10.8 Thousand/uL 8.9 9.6 9.1  Hemoglobin 13.2 - 17.1 g/dL 9.7(L) 9.7(L) 8.6(L)  Hematocrit 38.5 - 50.0 % 30.6(L) 32.3(L) 28.1(L)  Platelets 140 - 400 Thousand/uL 246 262 238   . CBC    Component Value Date/Time   WBC 8.9 07/01/2020 1605   RBC 3.53 (L) 07/01/2020 1605   HGB 9.7 (L) 07/01/2020 1605   HCT 30.6 (L) 07/01/2020 1605   HCT 30.3 (L) 07/05/2019 1416   PLT 246 07/01/2020 1605   MCV 86.7  07/01/2020 1605   MCH 27.5 07/01/2020 1605   MCHC 31.7 (L) 07/01/2020 1605   RDW 17.3 (H) 07/01/2020 1605   LYMPHSABS 659 (L) 07/01/2020 1605   MONOABS 1.1 (H) 06/12/2020 3614  EOSABS 160 07/01/2020 1605   BASOSABS 18 07/01/2020 1605     . CMP Latest Ref Rng & Units 07/01/2020 06/18/2020 06/13/2020  Glucose 65 - 99 mg/dL 108(H) 361(H) 212(H)  BUN 7 - 25 mg/dL 30(H) 63(H) 49(H)  Creatinine 0.70 - 1.11 mg/dL 1.96(H) 2.24(H) 2.15(H)  Sodium 135 - 146 mmol/L 140 140 133(L)  Potassium 3.5 - 5.3 mmol/L 4.8 4.3 3.5  Chloride 98 - 110 mmol/L 104 97(L) 102  CO2 20 - 32 mmol/L 28 30 23   Calcium 8.6 - 10.3 mg/dL 8.6 8.8 8.5(L)  Total Protein 6.1 - 8.1 g/dL 6.2 - -  Total Bilirubin 0.2 - 1.2 mg/dL 0.6 - -  Alkaline Phos 38 - 126 U/L - - -  AST 10 - 35 U/L 18 - -  ALT 9 - 46 U/L 23 - -   . Lab Results  Component Value Date   IRON 86 03/13/2020   TIBC 298 03/13/2020   IRONPCTSAT 29 03/13/2020   (Iron and TIBC)  Lab Results  Component Value Date   FERRITIN 523 (H) 03/13/2020     RADIOGRAPHIC STUDIES: No images were reviewed during this visit.   ASSESSMENT AND PLAN: 1) Normocytic Anemia 2/2 Iron deficiency anemia and CKD : -Recently admitted from 06/06/2020-06/13/2020 due to GI bleed. 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  -Currently on oral iron supplementation (Fusion Plus) once daily.  -Currently on warfarin but discontinued ASA.  -CBC from yesterday stable but persistent anemia with hemoglobin of 9.7 -Today's iron panel revealed ferritin, iron saturation are within normal limits.  -Patient will proceed with IV injectafer today. -Patient will return to the clinic in 3 months with repeat labs and tentative slot for IV injectafer.   Patient expressed understanding and satisfaction with the plan provided.   I have spent a total of 25 minutes minutes of face-to-face and non-face-to-face time, preparing to see the patient, obtaining  and/or reviewing separately obtained history, performing a medically appropriate examination, counseling and educating the patient, ordering medications, documenting clinical information in the electronic health record, and care coordination.   Lincoln Brigham PA-c Hematology and Oncology [facility]

## 2020-07-02 NOTE — Progress Notes (Signed)
============================================================ -   Test results slightly outside the reference range are not unusual. If there is anything important, I will review this with you,  otherwise it is considered normal test values.  If you have further questions,  please do not hesitate to contact me at the office or via My Chart.  ============================================================ ============================================================  -  CBC - is stable  with HGB = 9.7 gm% -  ============================================================ ============================================================  -  Kidney Functions improved & stable  ============================================================ ============================================================  -  Vitamin D = 49 - is a little low   - Vitamin D goal is between 70-100.   - Please INCREASE  your Vitamin D from 5,000 units up to 2 caps=10,000 units /day   - It is very important as a natural anti-inflammatory and helping the  immune system protect against viral infections, like the Covid-19    helping hair, skin, and nails, as well as reducing stroke and  heart attack risk.   - It helps your bones and helps with mood.  - It also decreases numerous cancer risks so please  take it as directed.   - Low Vit D is associated with a 200-300% higher risk for  CANCER   and 200-300% higher risk for HEART   ATTACK  &  STROKE.    - It is also associated with higher death rate at younger ages,   autoimmune diseases like Rheumatoid arthritis, Lupus,  Multiple Sclerosis.     - Also many other serious conditions, like depression, Alzheimer's  Dementia, infertility, muscle aches, fatigue, fibromyalgia   - just to name a few. ============================================================ ============================================================  -  Protime / INR = 1.7 x now, but due to recent  bleeding,                                                             favor slightly low rather than too high  - So, please stay on Coumadin 5 mg tab /day  for now  ============================================================ ============================================================  -  Total chol 133 and LDL 75 - Both  Excellent   - Very low risk for Heart Attack  / Stroke ============================================================ ============================================================  -  All Else - CBC - Kidneys - Electrolytes - Liver & Thyroid    - all  Normal / OK ============================================================  ============================================================

## 2020-07-02 NOTE — Chronic Care Management (AMB) (Signed)
  Chronic Care Management   Note  07/02/2020 Name: Vito Beg. MRN: [id number] DOB: [birth year]  [name] is a 80 y.o. year old male who is a primary care patient of [name], MD. I reached out to [name] by phone today in response to a referral sent by Mr. [name].'s PCP, [name], MD.   Mr. Verville was given information about Chronic Care Management services today including:  CCM service includes personalized support from designated clinical staff supervised by his physician, including individualized plan of care and coordination with other care providers 24/7 contact phone numbers for assistance for urgent and routine care needs. Service will only be billed when office clinical staff spend 20 minutes or more in a month to coordinate care. Only one practitioner may furnish and bill the service in a calendar month. The patient may stop CCM services at any time (effective at the end of the month) by phone call to the office staff.   Patient agreed to services and verbal consent obtained.   Follow up plan:   Tatjana Secretary/administrator

## 2020-07-02 NOTE — Progress Notes (Signed)
Patient refused 30 minute post iron observation. Stable upon discharge

## 2020-07-03 ENCOUNTER — Encounter (INDEPENDENT_AMBULATORY_CARE_PROVIDER_SITE_OTHER): Payer: Medicare Other | Admitting: Ophthalmology

## 2020-07-03 ENCOUNTER — Other Ambulatory Visit: Payer: Self-pay

## 2020-07-03 DIAGNOSIS — H34832 Tributary (branch) retinal vein occlusion, left eye, with macular edema: Secondary | ICD-10-CM | POA: Diagnosis not present

## 2020-07-03 DIAGNOSIS — H35033 Hypertensive retinopathy, bilateral: Secondary | ICD-10-CM

## 2020-07-03 DIAGNOSIS — I10 Essential (primary) hypertension: Secondary | ICD-10-CM

## 2020-07-03 DIAGNOSIS — H338 Other retinal detachments: Secondary | ICD-10-CM

## 2020-07-03 DIAGNOSIS — H43813 Vitreous degeneration, bilateral: Secondary | ICD-10-CM

## 2020-07-03 DIAGNOSIS — H353112 Nonexudative age-related macular degeneration, right eye, intermediate dry stage: Secondary | ICD-10-CM

## 2020-07-04 ENCOUNTER — Other Ambulatory Visit: Payer: Self-pay

## 2020-07-04 ENCOUNTER — Other Ambulatory Visit (HOSPITAL_COMMUNITY): Payer: Self-pay | Admitting: *Deleted

## 2020-07-04 DIAGNOSIS — N184 Chronic kidney disease, stage 4 (severe): Secondary | ICD-10-CM

## 2020-07-04 MED ORDER — DEXCOM G6 RECEIVER DEVI: 0 refills | Status: DC

## 2020-07-04 MED ORDER — DAPAGLIFLOZIN PROPANEDIOL 5 MG PO TABS: 5.0000 mg | ORAL_TABLET | Freq: Every day | ORAL | 3 refills | Status: AC

## 2020-07-04 MED ORDER — DEXCOM G6 TRANSMITTER MISC: 0 refills | Status: DC

## 2020-07-04 MED ORDER — DEXCOM G6 SENSOR MISC: 0 refills | Status: DC

## 2020-07-05 ENCOUNTER — Other Ambulatory Visit (HOSPITAL_COMMUNITY): Payer: Self-pay | Admitting: *Deleted

## 2020-07-10 ENCOUNTER — Telehealth: Payer: Self-pay | Admitting: Hematology

## 2020-07-10 DIAGNOSIS — M79644 Pain in right finger(s): Secondary | ICD-10-CM | POA: Diagnosis not present

## 2020-07-10 DIAGNOSIS — D509 Iron deficiency anemia, unspecified: Secondary | ICD-10-CM | POA: Diagnosis not present

## 2020-07-10 DIAGNOSIS — M109 Gout, unspecified: Secondary | ICD-10-CM | POA: Diagnosis not present

## 2020-07-10 DIAGNOSIS — M1990 Unspecified osteoarthritis, unspecified site: Secondary | ICD-10-CM | POA: Diagnosis not present

## 2020-07-10 DIAGNOSIS — M79643 Pain in unspecified hand: Secondary | ICD-10-CM | POA: Diagnosis not present

## 2020-07-10 DIAGNOSIS — N189 Chronic kidney disease, unspecified: Secondary | ICD-10-CM | POA: Diagnosis not present

## 2020-07-10 DIAGNOSIS — E1169 Type 2 diabetes mellitus with other specified complication: Secondary | ICD-10-CM | POA: Diagnosis not present

## 2020-07-10 DIAGNOSIS — M5459 Other low back pain: Secondary | ICD-10-CM | POA: Diagnosis not present

## 2020-07-10 NOTE — Telephone Encounter (Signed)
Per 06/28 los, mailed calendar

## 2020-07-15 ENCOUNTER — Other Ambulatory Visit (HOSPITAL_COMMUNITY): Payer: Self-pay | Admitting: Internal Medicine

## 2020-07-16 ENCOUNTER — Other Ambulatory Visit: Payer: Self-pay

## 2020-07-16 DIAGNOSIS — D509 Iron deficiency anemia, unspecified: Secondary | ICD-10-CM

## 2020-07-16 MED ORDER — FUSION PLUS PO CAPS: 1.0000 | ORAL_CAPSULE | Freq: Every day | ORAL | 2 refills | Status: DC

## 2020-07-17 ENCOUNTER — Encounter: Payer: Self-pay | Admitting: Nurse Practitioner

## 2020-07-17 ENCOUNTER — Ambulatory Visit: Payer: Medicare Other | Admitting: Nurse Practitioner

## 2020-07-17 VITALS — BP 120/68 | HR 60 | Ht 71.0 in | Wt 182.0 lb

## 2020-07-17 DIAGNOSIS — K31819 Angiodysplasia of stomach and duodenum without bleeding: Secondary | ICD-10-CM

## 2020-07-17 DIAGNOSIS — K31811 Angiodysplasia of stomach and duodenum with bleeding: Secondary | ICD-10-CM

## 2020-07-17 NOTE — Patient Instructions (Signed)
Follow up as needed   Due to recent changes in healthcare laws, you may see the results of your imaging and laboratory studies on MyChart before your provider has had a chance to review them.  We understand that in some cases there may be results that are confusing or concerning to you. Not all laboratory results come back in the same time frame and the provider may be waiting for multiple results in order to interpret others.  Please give [place] 48 hours in order for your provider to thoroughly review all the results before contacting the office for clarification of your results.    If you are age 43 or older, your body mass index should be between 23-30. Your Body mass index is 25.38 kg/m. If this is out of the aforementioned range listed, please consider follow up with your Primary Care Provider.  If you are age 50 or younger, your body mass index should be between 19-25. Your Body mass index is 25.38 kg/m. If this is out of the aformentioned range listed, please consider follow up with your Primary Care Provider.   __________________________________________________________  The [facility] providers would like to encourage you to use [facility] to communicate with providers for non-urgent requests or questions.  Due to long hold times on the telephone, sending your provider a message by [facility] may be a faster and more efficient way to get a response.  Please allow 48 business hours for a response.  Please remember that this is for non-urgent requests.    I appreciate the  opportunity to care for you  Thank You   [place]

## 2020-07-17 NOTE — Progress Notes (Signed)
ASSESSMENT AND PLAN    #80 year old male with recent hospitalization for GI bleed  ( melena) on warfarin with supratherapeutic INR. 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  --Follow-up as needed.   #Acute on chronic anemia in setting of recent GIB.  Transfused PRBCs in the hospital.  Since hospital discharge he has had follow-up with hematology and received IV iron.  Most recent hemoglobin on 07/01/2020 was 9.7.   #Esophageal candidiasis, incidental finding on recent inpatient EGD.  He completed a course of nystatin   # Right shoulder and right anterolateral ribcage pain post recent mechanical fall at home.  He thankfully did not strike his head ( on warfarin). He has healing abrasions on the right knee and RUE.  Rib cage pain improving but he has persistent right shoulder discomfort when lifting arm arm. If pain persists he will see PCP.      HISTORY OF PRESENT ILLNESS    Chief Complaint : hospital follow up  [name]. is a 80 y.o. male known to Dr. [name]  with a past medical history significant for atrial fibrillation on warfarin, chronic diastolic heart failure, CKD 3, diabetes, prolonged QT, iron deficiency anemia followed by Hematology. See PMH below for any additional medical problems.    Patient was hospitalized early June after having had several days of black stool at home.  He required 2 units of PRBCs for hemoglobin of 6.9.  He required vitamin K for supratherapeutic  INR. Inpatient small bowel enteroscopy with findings of esophageal candidiasis, gastritis and 2 small nonbleeding angioectasias in the stomach treated with APC.  Candida was treated with nystatin.  By the time of discharge his hemoglobin was stable at 8.6.  He has since started oral iron . He had follow up with Hematology 07/02/2020.  He has since received a dose of IV iron. Stool initially dark on iron but getting back to normal color.  His stools are urgent but not diarrheal.   [name] completed the course of Nystatin esophageal candidiasis. He hasn't noticed any while film / particles in his mouth. He has no dysphagia. Occasionally has reflux symptoms but Gaviscon helps.    PREVIOUS ENDOSCOPIC EVALUATIONS / PERTINENT STUDIES:   06/12/20 EGD for melena Esophageal plaques were found, consistent with candidiasis. - Gastritis. Biopsied. - Two non-bleeding angioectasias in the stomach. Treated with argon plasma coagulation (APC). - Normal examined duodenum. - The examined portion of the jejunum was normal.  FINAL MICROSCOPIC DIAGNOSIS:   A. STOMACH, BIOPSY:  - Reactive gastropathy.  - Warthin-Starry is negative for Helicobacter pylori.  - No intestinal metaplasia, dysplasia, or malignancy.    Past Medical History:  Diagnosis Date   Adrenal adenoma    Arthritis    Atypical atrial flutter ([place]) 8/15, 10/15   a. DCCV 08/2013. b. s/p RFA 10/2013.   Basal cell carcinoma    CAD (coronary artery disease)    a. 04/2013 CABG x 2: LIMA to LAD, SVG to RI, EVH via R thigh.   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 s/p MAZE 04/2013 in setting of CABG. b. Amio stopped in 10/2013 after flutter ablation.   PFO (patent foramen ovale)    a. Small PFO by TEE  10/2013.   Pleural effusion, left    a. s/p thoracentesis 05/2013.   Respiratory failure ([place])    a. Hypoxia 10/2013 - required supp O2 as inpatient, did not require it at discharge.   S/P Maze operation for atrial fibrillation    a. 04/2013: Complete bilateral atrial lesion set using cryothermy and bipolar radiofrequency ablation with clipping of LA appendage (@ time of CABG)    Current Medications, Allergies, Past Surgical History, Family History and Social History were reviewed in [employer] record.   Current Outpatient Medications  Medication Sig Dispense Refill   ACCU-CHEK AVIVA PLUS test strip CHECK BLOOD GLUCOSE 3 TIMES DAILY 300 strip 3   acetaminophen (TYLENOL) 500 MG tablet Take 1,000 mg by mouth every 6 (six) hours as needed for moderate pain or headache.      allopurinol (ZYLOPRIM) 300 MG tablet Take 300 mg by mouth daily.     ALPRAZolam (XANAX) 1 MG tablet Take 0.5 tablets (0.5 mg total) by mouth at bedtime as needed for sleep. 60 tablet 0   Alum Hydroxide-Mag Carbonate (GAVISCON PO) Take 4 tablets by mouth daily as needed (acid reflux).      Ascorbic Acid (VITAMIN C) 1000 MG tablet Take 1,000 mg by mouth daily.     atorvastatin (LIPITOR) 20 MG tablet Take  1 tablet  3 x /week  for Cholesterol 39 tablet 3   azelastine (ASTELIN) 0.1 % nasal spray Place 2 sprays into both nostrils 2 (two) times daily as needed for rhinitis or allergies. 30 mL 3   B Complex Vitamins (VITAMIN B COMPLEX PO) Take 1 tablet by mouth at bedtime.      bacitracin 500 UNIT/GM ointment Apply 1 application topically daily as needed for wound care.     Blood Glucose Monitoring Suppl (ACCU-CHEK AVIVA PLUS) w/Device KIT Check blood sugar 1 time  daily 1 kit 0   Blood Glucose Monitoring Suppl DEVI Test blood sugar up to three times a day or as directed. 1 each 0   carvedilol (COREG) 6.25 MG tablet TAKE ONE-HALF TABLET BY  MOUTH IN THE MORNING AND 1  TABLET BY MOUTH IN THE  EVENING 135 tablet  3   Cholecalciferol (VITAMIN D PO) Take 5,000 Units by mouth daily.      Continuous Blood Gluc Receiver (DEXCOM G6 RECEIVER) DEVI Use to check blood sugar multiple times a day for Hypoglycemia and Hyperglycemia 1 each 0   Continuous Blood Gluc Sensor (DEXCOM G6 SENSOR) MISC Use to check blood sugar multiple times a day for Hypoglycemia and Hyperglycemia 1 each 0   Continuous Blood Gluc Transmit (DEXCOM G6 TRANSMITTER) MISC Use to check blood sugar multiple times a day for Hypoglycemia and Hyperglycemia 1 each 0   dapagliflozin propanediol (FARXIGA) 5 MG TABS tablet Take 1 tablet (5 mg total) by mouth daily before breakfast. 90 tablet 3   dexamethasone (DECADRON) 4  MG tablet Take 1 tab 3 x day - 3 days, then 2 x day - 3 days, then 1 tab daily 20 tablet 0   diphenhydrAMINE (BENADRYL) 25 MG tablet Take 25 mg by mouth at bedtime.     fluticasone (FLONASE) 50 MCG/ACT nasal spray Place 1 spray into both nostrils daily as needed for allergies. 16 g 3   insulin NPH-regular Human (70-30) 100 UNIT/ML injection Inject 45 Units into the skin daily with breakfast. 35 units in the evening     Iron-FA-B Cmp-C-Biot-Probiotic (FUSION PLUS) CAPS Take 1 capsule by mouth daily. 90 capsule 2   loratadine (CLARITIN) 10 MG tablet Take 10 mg by mouth daily as needed for allergies.      Multiple Vitamins-Minerals (PRESERVISION AREDS 2) CAPS Take 1 capsule by mouth 2 (two) times daily.      nystatin (MYCOSTATIN) 100000 UNIT/ML suspension Take 5 mLs (500,000 Units total) by mouth 4 (four) times daily. 60 mL 0   pantoprazole (PROTONIX) 40 MG tablet Take 1 tablet (40 mg total) by mouth 2 (two) times daily before a meal. 60 tablet 0   polyvinyl alcohol (LIQUIFILM TEARS) 1.4 % ophthalmic solution Place 1 drop into both eyes daily as needed for dry eyes.     potassium chloride SA (KLOR-CON) 20 MEQ tablet TAKE 2 TABLETS BY MOUTH 3  TIMES DAILY FOR POTASSIUM 540 tablet 3   sertraline (ZOLOFT) 100 MG tablet TAKE 1 TABLET BY MOUTH   DAILY FOR MOOD 90 tablet 3   torsemide (DEMADEX) 100 MG tablet Take 150 mg by mouth daily. 1.5 tablet     warfarin (COUMADIN) 5 MG tablet Take    1.5 tablets /day      or as directed 135 tablet 3   zinc gluconate 50 MG tablet Take 50 mg by mouth daily.     No current facility-administered medications for this visit.    Review of Systems: No chest pain. No shortness of breath. No urinary complaints.   PHYSICAL EXAM :    Wt Readings from Last 3 Encounters:  07/17/20 182 lb (82.6 kg)  07/02/20 179 lb (81.2 kg)  07/01/20 178 lb 12.8 oz (81.1 kg)    BP 120/68   Pulse 60   Ht 5' 11"  (1.803 m)   Wt 182 lb (82.6 kg)   BMI 25.38 kg/m  Constitutional:  Pleasant male in no acute distress. Psychiatric: Normal mood and affect. Behavior is normal. EENT: Pupils normal.  Conjunctivae are normal. No scleral icterus. Neck supple.  Cardiovascular: Normal rate. No edema Pulmonary/chest: Effort normal and breath sounds normal. No wheezing, rales or rhonchi. Abdominal: Soft, nondistended, nontender. Bowel sounds active throughout. There are no masses palpable. No hepatomegaly. Neurological: Alert and oriented to person place and time. Skin: Skin is warm and dry. No rashes noted.  Tye Savoy, NP  07/17/2020, 2:15 PM  Cc:  [name], MD

## 2020-07-24 NOTE — Progress Notes (Signed)
____________________________________________________________  Attending physician addendum:  Thank you for sending this case to me. I have reviewed the entire note and agree with the plan.  His chronic IDA is combination of CKD and occult GI blood loss from known gastric and small bowel AVMs.  Most recently an acute component of that GI blood loss with supratherapeutic INR.  He needs long-term follow-up with hematology for periodic IV iron treatments and best efforts at maintaining his INR in the therapeutic range. No current plans for repeat endoscopic procedures.  Wilfrid Lund, MD  ____________________________________________________________

## 2020-08-04 DIAGNOSIS — I10 Essential (primary) hypertension: Secondary | ICD-10-CM | POA: Diagnosis not present

## 2020-08-04 DIAGNOSIS — E1122 Type 2 diabetes mellitus with diabetic chronic kidney disease: Secondary | ICD-10-CM | POA: Diagnosis not present

## 2020-08-05 ENCOUNTER — Telehealth: Payer: Self-pay | Admitting: Pharmacist

## 2020-08-05 NOTE — Progress Notes (Signed)
Name: [name].  MRN: [id number] DOB: 12-25-1940  Reason for Encounter: Chart Prep for CPP visit on 08/02@3pm .    Conditions to be addressed/monitored: Atrial Fibrillation, CHF, CAD, HTN, and CKD Stage , OSA on CPAP, GERD, Hyperlipidemia, DM 2 Stage 4, CKD Stage 3, Vitamin D Deficiency, Depression, Iron deficiency Anemia   Recent office visits:  07/01/20-Dr. Mamie [name] seen for follow up visit of HTN, ASHD/CABG, chAfib, Pulm. HTN, COPD, HLD, T2_IDDM , COPD and Vitamin D Deficiency on Coumadin presents for follow up after recent severe GI bleed in part due to excess dosing of coumadin as he apparently confused his dosing.BP 117/68, pulse 84. No medication changes.  06/18/20-Dr. Rubin Payor, NP) Patient seen for hospital follow up. BP 114/64, Pulse 66. Medication Changes: Stop Aspirin 34m daily.   06/06/20-Dr.McKeown-Patient seen for follow up visit for HTN,  Atrial fibrillation, chronic ([name], Type 2 diabetes mellitus with stage 4 chronic kidney disease, with long-term current use of insulin ([place]ar[place], Anticoagulation monitoring, INR range 2-3 and Medication management. No medication changes.     Recent consult visits:  07/13/222-Cory GChester Holstein NP([facility])-Patient seen for hospital Follow up due to GI Bleed. No medication changes.   07/02/20-Cory Thayil, PA(Oncology)Patient presented follow up for chronic anemia. No medication changes during visit.  06/18/20-Cory JUlice Dash MD(Gastroenterology)-Patient seen for surgery Pre-op diagnosis: upper gastrointestinal bleeding Post-op diagnosis: esophageal candida, gastritis, 2 gastric AVM  Hospital visits:  Medication Reconciliation was completed by comparing discharge summary, patient's EMR and Pharmacy list, and upon discussion with patient.  Admitted to the hospital on 06/09/20 due to GI Bleeding. Discharge date was 06/13/20[facility]     Medications: Outpatient  Encounter Medications as of 08/05/2020  Medication Sig Note   ACCU-CHEK AVIVA PLUS test strip CHECK BLOOD GLUCOSE 3 TIMES DAILY    acetaminophen (TYLENOL) 500 MG tablet Take 1,000 mg by mouth every 6 (six) hours as needed for moderate pain or headache.     allopurinol (ZYLOPRIM) 300 MG tablet Take 300 mg by mouth daily. 06/10/2020: LF 04/13   ALPRAZolam (XANAX) 1 MG tablet Take 0.5 tablets (0.5 mg total) by mouth at bedtime as needed for sleep.    Alum Hydroxide-Mag Carbonate (GAVISCON PO) Take 4 tablets by mouth daily as needed (acid reflux).     Ascorbic Acid (VITAMIN C) 1000 MG tablet Take 1,000 mg by mouth daily.    atorvastatin (LIPITOR) 20 MG tablet Take  1 tablet  3 x /week  for Cholesterol 06/10/2020: LF 02/14   azelastine (ASTELIN) 0.1 % nasal spray Place 2 sprays into both nostrils 2 (two) times daily as needed for rhinitis or allergies. 06/10/2020: LF 04/27   B Complex Vitamins (VITAMIN B COMPLEX PO) Take 1 tablet by mouth at bedtime.     bacitracin 500 UNIT/GM ointment Apply 1 application topically daily as needed for wound care.    Blood Glucose Monitoring Suppl (ACCU-CHEK AVIVA PLUS) w/Device KIT Check blood sugar 1 time  daily    Blood Glucose Monitoring Suppl DEVI Test blood sugar up to three times a day or as directed.    carvedilol (COREG) 6.25 MG tablet TAKE ONE-HALF TABLET BY  MOUTH IN THE MORNING AND 1  TABLET BY MOUTH IN THE  EVENING    Cholecalciferol (VITAMIN D PO) Take 5,000 Units by mouth daily.     Continuous Blood Gluc Receiver (DEXCOM G6 RECEIVER) DEVI Use to check blood sugar multiple times a day for Hypoglycemia and Hyperglycemia  Continuous Blood Gluc Sensor (DEXCOM G6 SENSOR) MISC Use to check blood sugar multiple times a day for Hypoglycemia and Hyperglycemia    Continuous Blood Gluc Transmit (DEXCOM G6 TRANSMITTER) MISC Use to check blood sugar multiple times a day for Hypoglycemia and Hyperglycemia    dapagliflozin propanediol (FARXIGA) 5 MG TABS tablet Take 1 tablet  (5 mg total) by mouth daily before breakfast.    dexamethasone (DECADRON) 4 MG tablet Take 1 tab 3 x day - 3 days, then 2 x day - 3 days, then 1 tab daily    diphenhydrAMINE (BENADRYL) 25 MG tablet Take 25 mg by mouth at bedtime.    fluticasone (FLONASE) 50 MCG/ACT nasal spray Place 1 spray into both nostrils daily as needed for allergies.    insulin NPH-regular Human (70-30) 100 UNIT/ML injection Inject 45 Units into the skin daily with breakfast. 35 units in the evening    Iron-FA-B Cmp-C-Biot-Probiotic (FUSION PLUS) CAPS Take 1 capsule by mouth daily.    loratadine (CLARITIN) 10 MG tablet Take 10 mg by mouth daily as needed for allergies.     Multiple Vitamins-Minerals (PRESERVISION AREDS 2) CAPS Take 1 capsule by mouth 2 (two) times daily.     nystatin (MYCOSTATIN) 100000 UNIT/ML suspension Take 5 mLs (500,000 Units total) by mouth 4 (four) times daily.    pantoprazole (PROTONIX) 40 MG tablet Take 1 tablet (40 mg total) by mouth 2 (two) times daily before a meal.    polyvinyl alcohol (LIQUIFILM TEARS) 1.4 % ophthalmic solution Place 1 drop into both eyes daily as needed for dry eyes.    potassium chloride SA (KLOR-CON) 20 MEQ tablet TAKE 2 TABLETS BY MOUTH 3  TIMES DAILY FOR POTASSIUM 06/10/2020: LF 03/13   sertraline (ZOLOFT) 100 MG tablet TAKE 1 TABLET BY MOUTH  DAILY FOR MOOD 06/10/2020: LF 03/14   torsemide (DEMADEX) 100 MG tablet Take 150 mg by mouth daily. 1.5 tablet 06/10/2020: LF 05/23   warfarin (COUMADIN) 5 MG tablet Take    1.5 tablets /day      or as directed 06/10/2020: Took 48m 06/05   zinc gluconate 50 MG tablet Take 50 mg by mouth daily.    No facility-administered encounter medications on file as of 08/05/2020.    Care Gaps: Shingrix, Covid -19, Opthalmology exam and Influenza Vaccine  Star Rating Drugs: Atorvastatin 257m Start 04/21/20, [facility] Mail [facility]  Left message to confirm Visit on 08/02@3pm    SIG: [id number]

## 2020-08-06 ENCOUNTER — Encounter: Payer: Self-pay | Admitting: Internal Medicine

## 2020-08-06 ENCOUNTER — Ambulatory Visit (INDEPENDENT_AMBULATORY_CARE_PROVIDER_SITE_OTHER): Payer: Medicare Other | Admitting: Internal Medicine

## 2020-08-06 ENCOUNTER — Other Ambulatory Visit: Payer: Self-pay

## 2020-08-06 ENCOUNTER — Ambulatory Visit (INDEPENDENT_AMBULATORY_CARE_PROVIDER_SITE_OTHER): Payer: Medicare Other | Admitting: Pharmacist

## 2020-08-06 VITALS — BP 122/64 | HR 85 | Temp 97.5°F | Resp 16 | Ht 71.0 in | Wt 177.2 lb

## 2020-08-06 DIAGNOSIS — F3342 Major depressive disorder, recurrent, in full remission: Secondary | ICD-10-CM

## 2020-08-06 DIAGNOSIS — I480 Paroxysmal atrial fibrillation: Secondary | ICD-10-CM

## 2020-08-06 DIAGNOSIS — Z79899 Other long term (current) drug therapy: Secondary | ICD-10-CM | POA: Diagnosis not present

## 2020-08-06 DIAGNOSIS — K31811 Angiodysplasia of stomach and duodenum with bleeding: Secondary | ICD-10-CM

## 2020-08-06 DIAGNOSIS — D509 Iron deficiency anemia, unspecified: Secondary | ICD-10-CM | POA: Diagnosis not present

## 2020-08-06 DIAGNOSIS — N184 Chronic kidney disease, stage 4 (severe): Secondary | ICD-10-CM | POA: Diagnosis not present

## 2020-08-06 DIAGNOSIS — Z794 Long term (current) use of insulin: Secondary | ICD-10-CM | POA: Diagnosis not present

## 2020-08-06 DIAGNOSIS — N183 Chronic kidney disease, stage 3 unspecified: Secondary | ICD-10-CM | POA: Diagnosis not present

## 2020-08-06 DIAGNOSIS — I10 Essential (primary) hypertension: Secondary | ICD-10-CM

## 2020-08-06 DIAGNOSIS — B079 Viral wart, unspecified: Secondary | ICD-10-CM | POA: Diagnosis not present

## 2020-08-06 DIAGNOSIS — I2510 Atherosclerotic heart disease of native coronary artery without angina pectoris: Secondary | ICD-10-CM | POA: Diagnosis not present

## 2020-08-06 DIAGNOSIS — E1122 Type 2 diabetes mellitus with diabetic chronic kidney disease: Secondary | ICD-10-CM | POA: Diagnosis not present

## 2020-08-06 DIAGNOSIS — D485 Neoplasm of uncertain behavior of skin: Secondary | ICD-10-CM

## 2020-08-06 DIAGNOSIS — Z7901 Long term (current) use of anticoagulants: Secondary | ICD-10-CM

## 2020-08-06 DIAGNOSIS — D500 Iron deficiency anemia secondary to blood loss (chronic): Secondary | ICD-10-CM | POA: Diagnosis not present

## 2020-08-06 DIAGNOSIS — I4892 Unspecified atrial flutter: Secondary | ICD-10-CM

## 2020-08-06 DIAGNOSIS — I5032 Chronic diastolic (congestive) heart failure: Secondary | ICD-10-CM | POA: Diagnosis not present

## 2020-08-06 DIAGNOSIS — K2100 Gastro-esophageal reflux disease with esophagitis, without bleeding: Secondary | ICD-10-CM

## 2020-08-06 NOTE — Patient Instructions (Signed)
Warfarin Information Warfarin is a blood thinner (anticoagulant). Anticoagulants help to prevent the formation of blood clots. They also help to stop the growth of blood clots. Your health care provider will monitor the anticoagulation effect of warfarin closely and will adjust your medicine asneeded. Who should use warfarin? Warfarin is prescribed for people who have blood clots, or who are at risk for developing harmful blood clots, such as people who: Have mechanical heart valves. Have irregular heart rhythms (atrial fibrillation). Have certain clotting disorders. Have had blood clots in the past or are currently receiving treatment for them. This includes people who have had a stroke, blood clots in the lungs (pulmonary embolism, or PE), or blood clots in the legs (deep vein thrombosis, or DVT). How is warfarin taken? Warfarin is taken by mouth (orally). Warfarin tablets come in different strengths. The strength is printed on the tablet and each strength is a different color. If you get a new prescription filled and the color of your tablet is different than usual, tell your pharmacist or health care provider immediately. Take warfarin exactly as told by your health care provider. Doing this helps you avoid bleeding or blood clots that could result in serious injury, pain, or disability. Take your medicine at the same time every day. If you forget to take your dose of warfarin, take it as soon as you remember that day. If you do not remember on that day, do not take an extra dose the next day. Contact your health care provider if you miss or take an extra dose. Do not change your dosage on your own to make up for missed or extra doses. What blood tests do I need while taking warfarin?  Warfarin is a medicine that needs to be closely monitored. It is very important to keep all lab visits and follow-up visits with your health care provider. While taking warfarin, you will need to have blood tests  regularly to measure your blood clotting time. These tests are called prothrombin tests (PT)or international normalized ratio (INR) tests. These tests can be done with afinger stick or a blood draw. What does the INR test result mean? The PT test results will be reported as the INR. The INR tells your health care provider whether your dosage of warfarin needs to be changed. The longer ittakes your blood to clot, the higher the INR. Your health care provider will tell you your target INR range. If your INR is not in your target range, your health care provider may adjust your dosage. If your INR is above your target range, there is a risk of bleeding. Your dosage of warfarin may need to be decreased. If your INR is below your target range, there is a risk of clotting. Your dosage of warfarin may need to be increased. How often is the INR test needed? When you first start warfarin, you will usually have your INR checked every few days. You may need to have INR tests done more than once a week until the health care provider determines the correct dosage of warfarin. After you have reached your target INR, your INR will be tested less often. However, you will need to have your INR checked at least once every 4-6 weeks for the entire time you are taking warfarin. Some people may qualify for a home monitoring program to check their INR. Ask your health care provider if you qualify for this program. What are the side effects of warfarin? Too much warfarin can cause   bleeding or hemorrhage in any part of the body, such as: Bleeding from the gums. Unexplained bruises or bruises that get larger. Blood in the urine or stools (feces) that are bloody or dark in color. Bleeding in the brain (hemorrhagic stroke). A nosebleed that is not easily stopped. Coughing up or vomiting blood. Warfarin use may also cause: Skin rash or irritations. Nausea that does not go away. Severe pain in the back or joints. Painful  toes that turn blue or purple (purple toe syndrome). Painful ulcers that do not go away (skin necrosis). What precautions do I need to take while using warfarin? Wear or carry identification that says that you are taking warfarin. Make sure that all health care providers, including your dentist, know you are taking warfarin. If you need surgery, talk with your health care provider about whether you should stop taking warfarin before your surgery. Avoid situations that cause bleeding. You may bleed more easily while taking warfarin. To limit bleeding, take the following actions: Use a softer toothbrush. Floss with waxed floss, not unwaxed floss. Shave with an electric razor, not with a blade. Limit your use of sharp objects. Avoid activities that put you at risk for injury, such as contact sports. What do I need to know about warfarin and pregnancy or breastfeeding? Warfarin is not recommended during the first trimester of pregnancy due to an increased risk of birth defects. In certain situations, a woman may take warfarin after her first trimester of pregnancy. If you are taking warfarin and you become pregnant or plan to become pregnant, contact your health care provider right away. If you plan to breastfeed while taking warfarin, talk with your health care provider first. What do I need to know about warfarin and alcohol or drug use? Avoid drinking alcohol, or limit alcohol intake to no more than 1 drink a day for nonpregnant women and 2 drinks a day for men. Be aware of how much alcohol is in your drink. In the [place] one drink equals one 12 oz bottle of beer (355 mL), one 5 oz glass of wine (148 mL), or one 1 oz glass of hard liquor (44 mL). If you change the amount of alcohol that you drink, tell your health care provider. Your warfarin dosage may need to be changed. Avoid tobacco products, such as cigarettes, e-cigarettes, and chewing tobacco. If you need help quitting, ask your health care  provider. If you change the amount of nicotine or tobacco that you use, tell your health care provider. Your warfarin dosage may need to be changed. Avoid street drugs while taking warfarin. The effects of street drugs on warfarin are not known. What do I need to know about warfarin and other medicines or supplements? Many prescription and over-the-counter medicines can interfere with warfarin. Talk with your health care provider or your pharmacist before starting or stopping any new medicines. This includes over-the-counter vitamins, dietary supplements, herbal medicines, and pain medicines. Your warfarin dosage may need to be adjusted. Some common over-the-counter medicines that may increase the risk of bleeding while taking warfarin include: Aspirin. NSAIDs, such as ibuprofen or naproxen. Vitamin E. Fish oils. What do I need to know about warfarin and my diet? It is important to maintain a normal, balanced diet while taking warfarin. Avoid major changes in your diet. If you are going to change your diet, talk with your health care provider before making changes. Your health care provider may recommend that you work with a dietitian. Vitamin K decreases   the effect of warfarin, and it is found in many foods. Eat a consistent amount of foods that contain vitamin K. For example, you may decide to eat 2 vitamin K-containing foods each day. Contact a health care provider if: You miss a dose. You take an extra dose. You plan to have any kind of surgery or procedure. You are unable to take your medicine due to nausea, vomiting, or diarrhea. You have any major changes in your diet or you plan to make any major changes in your diet. You start or stop any over-the-counter medicine, prescription medicine, herbal supplement, or dietary supplement. You become pregnant, plan to become pregnant, or think you may be pregnant. You have menstrual periods that are heavier than usual. You have unusual  bruising. Get help right away if you: Have signs of an allergic reaction, such as: Swelling of the lips, face, tongue, mouth, or throat. Rash or itchy, red, swollen areas of skin (hives). Trouble breathing. Chest tightness. Fall or have an accident, especially if you hit your head. Have signs that your blood is too thin, such as: Blood in your urine. Your urine may look reddish, pinkish, or tea-colored. Blood in your stool. Your stool may be black or bright red. You vomit blood or cough up blood. The blood may be bright red, or it may look like coffee grounds. Bleeding that does not stop after applying pressure to the area for 30 minutes. Have signs of a blood clot in your leg or arm, such as: Pain or swelling in your leg or arm. Skin that is red or warm to the touch on your arm or leg. Have signs of blood in your lung, such as: Shortness of breath or difficulty breathing. Chest pain. Unexplained fever. Have any symptoms of a stroke. "BE FAST" is an easy way to remember the main warning signs of a stroke: B - Balance. Signs are dizziness, sudden trouble walking, or loss of balance. E - Eyes. Signs are trouble seeing or a sudden change in vision. F - Face. Signs are sudden weakness or numbness of the face, or the face or eyelid drooping on one side. A - Arms. Signs are weakness or numbness in an arm. This happens suddenly and usually on one side of the body. S - Speech. Signs are sudden trouble speaking, slurred speech, or trouble understanding what people say. T - Time. Time to call emergency services. Write down what time symptoms started. Have other signs of a stroke, such as: A sudden, severe headache with no known cause. Nausea or vomiting. Seizure. Have other signs of a reaction to warfarin, such as: Purple or blue toes. Skin ulcers that do not go away. These symptoms may represent a serious problem that is an emergency. Do not wait to see if the symptoms will go away. Get  medical help right away. Call your local emergency services (911 in the [place] Do not drive yourself to the hospital. Summary Warfarin is a medicine that thins blood. It is used to prevent or treat blood clots. You must be monitored closely while on this medicine. Keep all follow-up visits. Make sure that you know your target INR range and your warfarin dosage. Wear or carry identification that says that you are taking warfarin. Take warfarin at the same time every day. Call your health care provider if you miss a dose or if you take an extra dose. Do not change the dosage of warfarin on your own. Know the signs and symptoms   of blood clots, bleeding, and a stroke. Know when to get emergency medical help. This information is not intended to replace advice given to you by your health care provider. Make sure you discuss any questions you have with your healthcare provider. Document Revised: 12/19/2019 Document Reviewed: 10/21/2018 Elsevier Patient Education  [employer].

## 2020-08-06 NOTE — Progress Notes (Signed)
Future Appointments  Date Time Provider [place]  08/06/2020  4:30 PM [name], MD [facility] None  08/14/2020 12:45 PM Hayden Pedro, MD [facility] None  09/13/2020 11:40 AM Bensimhon, Shaune Pascal, MD [facility] None  10/02/2020 10:20 AM Brunetta Genera, MD [facility] None  10/23/2020 11:00 AM [name], MD [facility] None  02/26/2021  3:30 PM Liane Comber, NP [facility] None    History of Present Illness:     Patient is a very nice 80 yo MWM with multiple co-morbidities  HTN, ASHD/CABG, chAfib, Pulm. HTN, COPD, HLD, T2_IDDM , COPD and Vitamin D Deficiency on Coumadin returns for short follow-up after recent hospitalization for GI bleed consequent of excess coumadin dosing. 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 Since last OV , he has had iron infusion at Dr Grier Mitts office. He returns for supervision of Coumadin dosing.  He denies any sx's suspect for GI bleeding.  He continues to experience DOE & rapid onset fatigue.  He also has concerns re: a large wart of the Lt index finger and several scaly pruritic areas of his vertex scalp that he desires to have "frozen".  Medications    dapagliflozin propanediol (FARXIGA) 5 MG TABS tablet, Take 1 tablet (5 mg total) by mouth daily before breakfast.   insulin NPH-regular Human (70-30) 100 UNIT/ML injection, Inject 45 Units into the skin daily with breakfast. 35 units in the evening   atorvastatin (LIPITOR) 20 MG tablet, Take  1 tablet  3 x /week  for Cholesterol   carvedilol (COREG) 6.25 MG tablet, TAKE ONE-HALF TABLET BY  MOUTH IN THE MORNING AND 1  TABLET BY MOUTH IN THE  EVENING   torsemide (DEMADEX) 100 MG tablet, Take 150 mg by mouth daily. 1.5 tablet   azelastine (ASTELIN) 0.1 % nasal spray, Place 2 sprays into both nostrils 2 (two) times daily as needed for rhinitis or allergies.   diphenhydrAMINE (BENADRYL) 25 MG tablet, Take 25 mg by mouth at bedtime.   fluticasone (FLONASE) 50 MCG/ACT nasal  spray, Place 1 spray into both nostrils daily as needed for allergies.   loratadine (CLARITIN) 10 MG tablet, Take 10 mg by mouth daily as needed for allergies.    acetaminophen (TYLENOL) 500 MG tablet, Take 1,000 mg by mouth every 6 (six) hours as needed for moderate pain or headache.    allopurinol (ZYLOPRIM) 300 MG tablet, Take 300 mg by mouth daily.   Iron-FA-B Cmp-C-Biot-Probiotic (FUSION PLUS) CAPS, Take 1 capsule by mouth daily.    warfarin (COUMADIN) 5 MG tablet, Take    1 tablets /day        ALPRAZolam (XANAX) 1 MG tablet, Take 0.5 tablets (0.5 mg total) by mouth at bedtime as needed for sleep.   Alum Hydroxide-Mag Carbonate (GAVISCON PO), Take 4 tablets by mouth daily as needed (acid reflux).    Ascorbic Acid (VITAMIN C) 1000 MG tablet, Take 1,000 mg by mouth daily.   B Complex Vitamins (VITAMIN B COMPLEX PO), Take 1 tablet by mouth at bedtime.    bacitracin 500 UNIT/GM ointment, Apply 1 application topically daily as needed for wound care.   Blood Glucose Monitoring Suppl (ACCU-CHEK AVIVA PLUS) w/Device KIT, Check blood sugar 1 time  daily   Cholecalciferol (VITAMIN D PO), Take 5,000 Units by mouth daily.    Continuous Blood Gluc Receiver ([place]) DEVI, Use to check blood sugar multiple times a day for Hypoglycemia and Hyperglycemia   Multiple Vitamins-Minerals (PRESERVISION AREDS 2) CAPS, Take  1 capsule by mouth 2 (two) times daily.    nystatin (MYCOSTATIN) 100000 UNIT/ML suspension, Take 5 mLs (500,000 Units total) by mouth 4 (four) times daily.   pantoprazole (PROTONIX) 40 MG tablet, Take 1 tablet (40 mg total) by mouth 2 (two) times daily before a meal.   polyvinyl alcohol (LIQUIFILM TEARS) 1.4 % ophthalmic solution, Place 1 drop into both eyes daily as needed for dry eyes.   potassium chloride SA (KLOR-CON) 20 MEQ tablet, TAKE 2 TABLETS BY MOUTH 3  TIMES DAILY FOR POTASSIUM   sertraline (ZOLOFT) 100 MG tablet, TAKE 1 TABLET BY MOUTH  DAILY FOR MOOD   zinc gluconate 50  MG tablet, Take 50 mg by mouth daily.  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; Type 2 diabetes mellitus with stage 4 chronic kidney disease, with long-term current use of insulin ([place]); GERD; DJD (degenerative joint disease); Vitamin D deficiency; Chronic anticoagulation (INR goal 2.0-2.5); Essential hypertension; CAD (coronary artery disease); PFO (patent foramen ovale); 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[place]); OSA on CPAP; S/P Maze operation for atrial fibrillation; Pulmonary hypertension ([place]); 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   Observations/Objective:  BP 122/64   Pulse 85   Temp (!) 97.5 F (36.4 C)   Resp 16   Ht 5' 11"  (1.803 m)   Wt 177 lb 3.2 oz (80.4 kg)   SpO2 96%   BMI 24.71 kg/m   HEENT - WNL. Neck - supple.  Chest - Clear distant w/o rales/rhonchi. Cor - Nl HS. RRR w/o sig MGR. PP 1(+). Trace 1+ ankle edema. MS- FROM w/o deformities.  Gait Nl. Neuro -  Nl w/o focal abnormalities. Skin - There is a 7 mm raised  flat wart over the dorsal DIP of the Lt index finger & after informed consent, it is treated with liq Nitrogen cryosurgery by triple  freeze-thaw technique (CPT- 17000) .  There are # 6 raised circumscribed  raised crusty lesions of the vertex scalp - all ~ 5-8 mm treated likewise  by cryosurgery with liq N2 by triple freeze-thaw technique (CPT 17003 x 4) .  Patient is instructed in post-op care for all treated areas.   Assessment and Plan:  1. Essential hypertension  - CBC with Differential/Platelet - COMPLETE METABOLIC PANEL WITH GFR  2. PAF (paroxysmal atrial fibrillation) ([place])  - Protime-INR  3. Gastrointestinal hemorrhage associated with angiodysplasia of stomach and duodenum  - CBC with Differential/Platelet - Protime-INR  4. Iron deficiency anemia, unspecified iron deficiency anemia type  - CBC with Differential/Platelet - Protime-INR  5. Chronic anticoagulation  - Protime-INR  6. Neoplasm of uncertain behavior of scalp   7. Viral wart on finger   8. Medication management  - CBC with Differential/Platelet - COMPLETE METABOLIC PANEL WITH GFR - Protime-INR   Follow Up Instructions:      I discussed the assessment and treatment plan with the patient. The patient was provided an opportunity to ask questions and all were answered. The  patient agreed with the plan and demonstrated an understanding of the instructions.       The patient was advised to call back or seek an in-person evaluation if the symptoms worsen or if the condition fails to improve as anticipated.   Kirtland Bouchard, MD

## 2020-08-07 LAB — CBC WITH DIFFERENTIAL/PLATELET
Absolute Monocytes: 673 cells/uL (ref 200–950)
Basophils Absolute: 91 cells/uL (ref 0–200)
Basophils Relative: 1 %
Eosinophils Absolute: 182 cells/uL (ref 15–500)
Eosinophils Relative: 2 %
HCT: 37.8 % — ABNORMAL LOW (ref 38.5–50.0)
Hemoglobin: 11.9 g/dL — ABNORMAL LOW (ref 13.2–17.1)
Lymphs Abs: 628 cells/uL — ABNORMAL LOW (ref 850–3900)
MCH: 28.5 pg (ref 27.0–33.0)
MCHC: 31.5 g/dL — ABNORMAL LOW (ref 32.0–36.0)
MCV: 90.4 fL (ref 80.0–100.0)
MPV: 8.7 fL (ref 7.5–12.5)
Monocytes Relative: 7.4 %
Neutro Abs: 7526 cells/uL (ref 1500–7800)
Neutrophils Relative %: 82.7 %
Platelets: 313 10*3/uL (ref 140–400)
RBC: 4.18 10*6/uL — ABNORMAL LOW (ref 4.20–5.80)
RDW: 18.3 % — ABNORMAL HIGH (ref 11.0–15.0)
Total Lymphocyte: 6.9 %
WBC: 9.1 10*3/uL (ref 3.8–10.8)

## 2020-08-07 LAB — COMPLETE METABOLIC PANEL WITH GFR
AG Ratio: 1.4 (calc) (ref 1.0–2.5)
ALT: 24 U/L (ref 9–46)
AST: 23 U/L (ref 10–35)
Albumin: 4 g/dL (ref 3.6–5.1)
Alkaline phosphatase (APISO): 242 U/L — ABNORMAL HIGH (ref 35–144)
BUN/Creatinine Ratio: 15 (calc) (ref 6–22)
BUN: 27 mg/dL — ABNORMAL HIGH (ref 7–25)
CO2: 26 mmol/L (ref 20–32)
Calcium: 8.8 mg/dL (ref 8.6–10.3)
Chloride: 102 mmol/L (ref 98–110)
Creat: 1.83 mg/dL — ABNORMAL HIGH (ref 0.70–1.22)
Globulin: 2.8 g/dL (calc) (ref 1.9–3.7)
Glucose, Bld: 211 mg/dL — ABNORMAL HIGH (ref 65–99)
Potassium: 4.5 mmol/L (ref 3.5–5.3)
Sodium: 138 mmol/L (ref 135–146)
Total Bilirubin: 0.6 mg/dL (ref 0.2–1.2)
Total Protein: 6.8 g/dL (ref 6.1–8.1)
eGFR: 37 mL/min/{1.73_m2} — ABNORMAL LOW (ref >=60)

## 2020-08-07 LAB — PROTIME-INR
INR: 2.1 — ABNORMAL HIGH
Prothrombin Time: 20.5 s — ABNORMAL HIGH (ref 9.0–11.5)

## 2020-08-07 NOTE — Progress Notes (Signed)
Chronic Care Management Pharmacy Note  09/17/2020 Name:  [name]. MRN:  [id number] DOB:  05-Jan-1941  Summary: Patient is doing well. Followed by [facility] for remote BP, CHF, O2, BG monitoring.  Recommendations/Changes made from today's visit: Reduce Benadryl frequency   Subjective: [name]. is an 80 y.o. year old male who is a primary patient of [name], MD.  The CCM team was consulted for assistance with disease management and care coordination needs.    Engaged with patient by telephone for initial visit in response to provider referral for pharmacy case management and/or care coordination services.   Consent to Services:  The patient was given information about Chronic Care Management services, agreed to services, and gave verbal consent prior to initiation of services.  Please see initial visit note for detailed documentation.   Patient Care Team: [name], MD as PCP - General (Internal Medicine) Inda Castle, MD (Inactive) as Consulting Physician (Gastroenterology) Martinique, Peter M, MD as Consulting Physician (Cardiology) Rexene Alberts, MD (Inactive) as Consulting Physician (Cardiothoracic Surgery) Szott, Prescott Gum, DDS as Referring Physician Hayden Pedro, MD as Consulting Physician (Ophthalmology) Lavonna Monarch, MD as Consulting Physician (Dermatology) Josue Hector, MD as Consulting Physician (Cardiology) Thompson Grayer, MD as Consulting Physician (Cardiology) Rosita Fire, MD as Consulting Physician (Nephrology) Bensimhon, Shaune Pascal, MD as Consulting Physician (Cardiology) Rush Landmark, [facility] as Pharmacist (Pharmacist)  Recent office visits: 07/01/20-Dr. Mamie Levers seen for follow up visit of HTN, ASHD/CABG, chAfib, Pulm. HTN, COPD, HLD, T2_IDDM , COPD and Vitamin D Deficiency on Coumadin presents for follow up after recent severe GI bleed in part due to excess dosing of coumadin as he apparently  confused his dosing.BP 117/68, pulse 84. No medication changes.   06/18/20-Dr. Rubin Payor, NP) Patient seen for hospital follow up. BP 114/64, Pulse 66. Medication Changes: Stop Aspirin 39m daily.   06/06/20-Dr.McKeown-Patient seen for follow up visit for HTN,  Atrial fibrillation, chronic (H[place], Type 2 diabetes mellitus with stage 4 chronic kidney disease, with long-term current use of insulin ([place], Anticoagulation monitoring, INR range 2-3 and Medication management. No medication changes.    Recent consult visits: 07/13/222-Paula GChester Holstein NP([facility])-Patient seen for hospital Follow up due to GI Bleed. No medication changes.   07/02/20-Irene Thayil, PA(Oncology)Patient presented follow up for chronic anemia. No medication changes during visit.   06/18/20-Pyrtle JUlice Dash MD(Gastroenterology)-Patient seen for surgery Pre-op diagnosis: upper gastrointestinal bleeding Post-op diagnosis: esophageal candida, gastritis, 2 gastric AVM    Hospital visits: Medication Reconciliation was completed by comparing discharge summary, patient's EMR and Pharmacy list, and upon discussion with patient.   Admitted to the hospital on 06/09/20 due to GI Bleeding. Discharge date [facility] 06/13/20[facility]      Objective:  Lab Results  Component Value Date   CREATININE 1.73 (H) 09/13/2020   BUN 29 (H) 09/13/2020   GFR 68.62 05/01/2014   GFRNONAA 39 (L) 09/13/2020   GFRAA 36 (L) 07/01/2020   NA 140 09/13/2020   K 5.1 09/13/2020   CALCIUM 8.9 09/13/2020   CO2 28 09/13/2020   GLUCOSE 193 (H) 09/13/2020    Lab Results  Component Value Date/Time   HGBA1C 7.0 (H) 06/10/2020 04:34 AM   HGBA1C 8.2 (H) 02/06/2020 02:31 PM   FRUCTOSAMINE 589 (H) 09/04/2016 10:31 AM   GFR 68.62 05/01/2014 11:22 AM   GFR 72.39 04/03/2014 11:37 AM   MICROALBUR 5.5 10/11/2019 10:04 AM   MICROALBUR 9.8 09/08/2018 12:00 AM  Last diabetic Eye exam:  Lab Results   Component Value Date/Time   HMDIABEYEEXA Retinopathy (A) 09/30/2018 12:00 AM    Last diabetic Foot exam: No results found for: HMDIABFOOTEX   Lab Results  Component Value Date   CHOL 133 07/01/2020   HDL 31 (L) 07/01/2020   LDLCALC 75 07/01/2020   TRIG 176 (H) 07/01/2020   CHOLHDL 4.3 07/01/2020    Hepatic Function Latest Ref Rng & Units 08/06/2020 07/01/2020 06/09/2020  Total Protein 6.1 - 8.1 g/dL 6.8 6.2 6.3(L)  Albumin 3.5 - 5.0 g/dL - - 3.9  AST 10 - 35 U/L 23 18 16   ALT 9 - 46 U/L 24 23 15   Alk Phosphatase 38 - 126 U/L - - 111  Total Bilirubin 0.2 - 1.2 mg/dL 0.6 0.6 0.4  Bilirubin, Direct 0.0 - 0.2 mg/dL - - -    Lab Results  Component Value Date/Time   TSH 2.07 07/01/2020 04:05 PM   TSH 1.56 02/06/2020 02:31 PM    CBC Latest Ref Rng & Units 08/06/2020 07/01/2020 06/18/2020  WBC 3.8 - 10.8 Thousand/uL 9.1 8.9 9.6  Hemoglobin 13.2 - 17.1 g/dL 11.9(L) 9.7(L) 9.7(L)  Hematocrit 38.5 - 50.0 % 37.8(L) 30.6(L) 32.3(L)  Platelets 140 - 400 Thousand/uL 313 246 262    Lab Results  Component Value Date/Time   VD25OH 49 07/01/2020 04:05 PM   VD25OH 56 02/06/2020 02:31 PM    Clinical ASCVD: Yes  The ASCVD Risk score (Arnett DK, et al., 2019) failed to calculate for the following reasons:   The 2019 ASCVD risk score is only valid for ages 38 to 72    Depression screen PHQ 2/9 04/29/2020 02/27/2020 12/18/2019  Decreased Interest 0 0 0  Down, Depressed, Hopeless 0 0 0  PHQ - 2 Score 0 0 0  Altered sleeping - - -  Tired, decreased energy - - -  Change in appetite - - -  Feeling bad or failure about yourself  - - -  Trouble concentrating - - -  Moving slowly or fidgety/restless - - -  Suicidal thoughts - - -  PHQ-9 Score - - -  Difficult doing work/chores - - -  Some recent data might be hidden     See below as applicable Other: ([id number] if Afib, MMRC or CAT for COPD, ACT, DEXA)  Social History   Tobacco Use  Smoking Status Former   Packs/day: 4.00   Years:  25.00   Pack years: 100.00   Types: Cigarettes   Quit date: 01/05/1981   Years since quitting: 39.7  Smokeless Tobacco Never   BP Readings from Last 3 Encounters:  09/13/20 120/66  09/10/20 (!) 116/58  08/06/20 122/64   Pulse Readings from Last 3 Encounters:  09/13/20 66  09/10/20 71  08/06/20 85   Wt Readings from Last 3 Encounters:  09/13/20 178 lb 3.2 oz (80.8 kg)  09/10/20 180 lb 12.8 oz (82 kg)  08/06/20 177 lb 3.2 oz (80.4 kg)   BMI Readings from Last 3 Encounters:  09/13/20 24.85 kg/m  09/10/20 25.22 kg/m  08/06/20 24.71 kg/m    Assessment/Interventions: Review of patient past medical history, allergies, medications, health status, including review of consultants reports, laboratory and other test data, was performed as part of comprehensive evaluation and provision of chronic care management services.   SDOH:  (Social Determinants of Health) assessments and interventions performed: Yes SDOH Interventions    Flowsheet Row Most Recent Value  SDOH Interventions   Housing Interventions Intervention  Not Indicated  Transportation Interventions Intervention Not Indicated      SDOH Screenings   Alcohol Screen: Not on file  Depression (PHQ2-9): Low Risk    PHQ-2 Score: 0  Financial Resource Strain: Not on file  Food Insecurity: Not on file  Housing: [place] Risk Score: 0  Physical Activity: Not on file  Social Connections: Not on file  Stress: Not on file  Tobacco Use: Medium Risk   Smoking Tobacco Use: Former   Smokeless Tobacco Use: Never  Transportation Needs: No Transportation Needs   Lack of Transportation (Medical): No   Lack of Transportation (Non-Medical): No    CCM Care Plan  Allergies  Allergen Reactions   Sunflower Oil Swelling and Other (See Comments)    Tongue and lip swelling Other reaction(s): Other Tongue and lip swelling   Horse-Derived Products Other (See Comments)    Per allergy skin test UNSPECIFIED REACTION   Other reaction(s): Other Per allergy skin test UNSPECIFIED REACTION    Other Other (See Comments)    Tetanus Shot -- skin test reaction   Immune Globulin    Tetanus Toxoids Other (See Comments)    Per allergy skin test   Tetanus Toxoid     Other reaction(s): Other (See Comments) Rash(horse serum) Other reaction(s): Other Rash(horse serum)    Medications Reviewed Today     Reviewed by Enid Derry, CMA (Certified Medical Assistant) on 09/13/20 at 1209  Med List Status: <None>   Medication Order Taking? Sig Documenting Provider Last Dose Status Informant  ACCU-CHEK AVIVA PLUS test strip [id number] Yes CHECK BLOOD GLUCOSE 3 TIMES DAILY [name], MD Taking Active Self  acetaminophen (TYLENOL) 500 MG tablet [id number] Yes Take 1,000 mg by mouth every 6 (six) hours as needed for moderate pain or headache.  [provider] Taking Active Self  allopurinol (ZYLOPRIM) 300 MG tablet [id number] Yes Take 300 mg by mouth daily. [provider] Taking Active Self           Med Note Burt Knack, ERICIA A   Mon Jun 10, 2020  3:54 AM) LF 04/13  ALPRAZolam (XANAX) 1 MG tablet [id number] Yes Take 0.5 tablets (0.5 mg total) by mouth at bedtime as needed for sleep. [name], MD Taking Active Self           Med Note [facility], PHILICIA R   Mon Feb 20, 2019  2:18 PM)    Alum Hydroxide-Mag Carbonate (GAVISCON PO) [id number] Yes Take 4 tablets by mouth daily as needed (acid reflux).  [provider] Taking Active Self  Ascorbic Acid (VITAMIN C) 1000 MG tablet [id number] Yes Take 1,000 mg by mouth daily. [provider] Taking Active Self  atorvastatin (LIPITOR) 20 MG tablet [id number] Yes Take  1 tablet  3 x /week  for Cholesterol [name], MD Taking Active Self           Med Note Burt Knack, ERICIA A   Mon Jun 10, 2020  3:55 AM) LF 02/14  azelastine (ASTELIN) 0.1 % nasal spray [id number] Yes Place 2 sprays into both nostrils 2 (two) times daily as needed for  rhinitis or allergies. [name], MD Taking Active Self           Med Note Rolland Porter Jun 10, 2020  3:55 AM) LF 04/27  B Complex Vitamins (VITAMIN B COMPLEX PO) [id number] Yes Take 1 tablet by mouth at bedtime.  [provider] Taking Active Self  bacitracin 500 UNIT/GM ointment [id number] Yes Apply 1 application topically daily as needed for wound care. [provider] Taking Active Self           Med Note Harl Bowie, PHILICIA R   Mon Feb 20, 2019  2:18 PM)    Blood Glucose Monitoring Suppl (ACCU-CHEK AVIVA PLUS) w/Device KIT [id number] Yes Check blood sugar 1 time  daily [name], MD Taking Active Self  Blood Glucose Monitoring Suppl DEVI [id number] Yes Test blood sugar up to three times a day or as directed. Liane Comber, NP Taking Active Self  carvedilol (COREG) 6.25 MG tablet [id number] Yes TAKE ONE-HALF TABLET BY  MOUTH IN THE MORNING AND 1  TABLET BY MOUTH IN THE  EVENING Bensimhon, Shaune Pascal, MD Taking Active   Cholecalciferol (VITAMIN D PO) [id number] Yes Take 5,000 Units by mouth daily.  [provider] Taking Active Self  colchicine 0.6 MG tablet [id number] Yes Take 0.6 mg by mouth every other day. [provider] Taking Active   Continuous Blood Gluc Receiver ([place]) [place] [id number] Yes Use to check blood sugar multiple times a day for Hypoglycemia and Hyperglycemia [name], MD Taking Active   Continuous Blood Gluc Sensor ([place]) [place] [id number] Yes Use to check blood sugar multiple times a day for Hypoglycemia and Hyperglycemia [name], MD Taking Active   Continuous Blood Gluc Transmit (DEXCOM G6 TRANSMITTER) MISC [id number] Yes Use to check blood sugar multiple times a day for Hypoglycemia and Hyperglycemia [name], MD Taking Active   dapagliflozin propanediol (FARXIGA) 5 MG TABS tablet [id number] Yes Take 1 tablet (5 mg total) by mouth daily before breakfast. Bensimhon, Shaune Pascal, MD  Taking Active   diphenhydrAMINE (BENADRYL) 25 MG tablet [id number] Yes Take 25 mg by mouth as needed. [provider] Taking Active Self           Med Note Orvan Seen, HEATHER L   Mon Dec 23, 2015  3:15 PM)    fluticasone (FLONASE) 50 MCG/ACT nasal spray [id number] Yes Place 1 spray into both nostrils daily as needed for allergies. [name], MD Taking Active Self  HYDROcodone-acetaminophen (NORCO/VICODIN) 5-325 MG tablet [id number] Yes 1 tablet as needed [provider] Taking Active   insulin NPH-regular Human (70-30) 100 UNIT/ML injection [id number] Yes Inject 45 Units into the skin daily with breakfast. 25units in the evening [provider] Taking Active Self  Iron-FA-B Cmp-C-Biot-Probiotic (FUSION PLUS) CAPS [id number] No Take 1 capsule by mouth daily.  Patient not taking: Reported on 09/13/2020   Magda Bernheim, NP Not Taking Active   loratadine (CLARITIN) 10 MG tablet [id number] Yes Take 10 mg by mouth daily as needed for allergies.  [provider] Taking Active Self  metolazone (ZAROXOLYN) 2.5 MG tablet [id number] Yes Take 2.5 mg by mouth daily. Only as needed for CHF related weight gain [provider] Taking Active   metolazone (ZAROXOLYN) 5 MG tablet [id number] Yes 1 tablet as needed [provider] Taking Active   Multiple Vitamins-Minerals (PRESERVISION AREDS 2) CAPS [id number] Yes Take 1 capsule by mouth 2 (two) times daily.  [provider] Taking Active Self  polyvinyl alcohol (LIQUIFILM TEARS) 1.4 % ophthalmic solution [id number] Yes Place 1 drop into both eyes daily as needed for dry eyes. [provider] Taking Active Self  potassium chloride SA (KLOR-CON) 20 MEQ tablet [id number] Yes TAKE 2 TABLETS BY MOUTH 3  TIMES DAILY FOR POTASSIUM Liane Comber, NP Taking Active Self  Med Note Rolland Porter Jun 10, 2020  3:56 AM) LF 03/13  sertraline (ZOLOFT) 100 MG tablet [id number] Yes TAKE 1 TABLET BY  MOUTH  DAILY FOR MOOD Bensimhon, Shaune Pascal, MD Taking Active Self           Med Note Virgel Paling A   Mon Jun 10, 2020  3:56 AM) LF 03/14  torsemide (DEMADEX) 100 MG tablet [id number] Yes Take 150 mg by mouth daily. 1.5 tablet [provider] Taking Active Self           Med Note Burt Knack, ERICIA A   Mon Jun 10, 2020  3:56 AM) LF 05/23  trimethoprim-polymyxin b (POLYTRIM) ophthalmic solution [id number] Yes Only when he has eye injections [provider] Taking Active   warfarin (COUMADIN) 10 MG tablet [id number] Yes Take 10 mg by mouth 2 (two) times a week. [provider] Taking Active   warfarin (COUMADIN) 5 MG tablet [id number] Yes Take    1.5 tablets /day      or as directed  Patient taking differently: Take 1.5 tablets by mouth x 5 days weekly   [name], MD Taking Active Self           Med Note Vikki Ports   Mon Jun 10, 2020  4:29 AM) Took 53m 06/05  zinc gluconate 50 MG tablet [id number] Take 50 mg by mouth daily. 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   Chronic restrictive lung disease 09/03/2014   Pulmonary hypertension ([place] 08/23/2014   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[place] 01/30/2014   OSA on CPAP 01/30/2014   PFO (patent foramen ovale) 10/30/2013   CAD (coronary artery disease) 10/29/2013   Essential hypertension 10/25/2013   Chronic anticoagulation  (INR goal 2.0-2.5) 08/22/2013   Atrial flutter (HH[place] 08/21/2013   Vitamin D deficiency 05/19/2013   Type 2 diabetes mellitus with stage 4 chronic kidney disease, with long-term current use of insulin (HCC)    GERD    DJD (degenerative joint disease)    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 04/05/2013   S/P Maze operation for atrial fibrillation 04/05/2013   Chronic diastolic congestive heart failure (H[place]    Hypertensive cardiomyopathy (HN[place]    Hyperlipidemia associated with type 2 diabetes mellitus ([place] 09/03/2008   PAF (paroxysmal atrial fibrillation) (HG[place] 09/03/2008   Diverticulosis of large intestine 09/03/2008   Colonic Polyps 09/03/2008    Immunization History  Administered Date(s) Administered   DT (Pediatric) 08/05/2015   Influenza Split 10/25/2012   Influenza, High Dose Seasonal PF 09/26/2013, 09/27/2014, 10/03/2015, 10/05/2016, 11/11/2017, 10/10/2018, 10/11/2019   Influenza-Unspecified 10/05/2016, 09/22/2017   PFIZER(Purple Top)SARS-COV-2 Vaccination 01/26/2019, 02/15/2019   Pneumococcal Conjugate-13 01/30/2014   Pneumococcal Polysaccharide-23 10/20/2011   Td 01/06/2000    Conditions to be addressed/monitored:  Hypertension, Hyperlipidemia, Diabetes, Atrial Fibrillation,  Heart Failure, Coronary Artery Disease, GERD, Chronic Kidney Disease, Depression, Anxiety, and Gout Anemia  Care Plan : General Pharmacy (Adult)  Updates made by Rush Landmark, [place] since 09/17/2020 12:00 AM     Problem: Hypertension, Hyperlipidemia, Diabetes, Atrial Fibrillation, Heart Failure, Coronary Artery Disease, GERD, Chronic Kidney Disease, Depression, Anxiety, and Gout      Long-Range Goal: Disease management   Start Date: 08/06/2020  Expected End Date: 08/06/2021  Priority: High  Note:    Current Barriers:  None identified  Pharmacist Clinical Goal(s):  Patient will achieve adherence to monitoring guidelines and medication adherence to achieve therapeutic efficacy through  collaboration with PharmD and provider.   Interventions: 1:1 collaboration with [name], MD regarding development and update of comprehensive plan of care as evidenced by provider attestation and co-signature Inter-disciplinary care team collaboration (see longitudinal plan of care) Comprehensive medication review performed; medication list updated in electronic medical record  Hypertension  (Status:Goal on track: YES.)   Med Management Intervention: None  (BP goal <130/80) -Controlled -Current treatment: Carvedilol 6.25 mg 1/2 tablet QAM 1/2 PM (appropriate, effective, safe, accessible) Metolazone 2.5 mg daily for edema (CHF) (appropriate, effective, safe, accessible) Torsemide 100 mg 1.5 tablets daily (appropriate, effective, safe, accessible) -Medications previously tried: N/A  -Current home readings: checking daily through [facility]  (within normal limits) The patient  reports that he quit smoking about 39 years ago. His smoking use included cigarettes. He has a 100.00 pack-year smoking history 1 alcohol drink per week -Current dietary habits: Limits salt -Current exercise habits: Limited due to anemia -Denies hypotensive/hypertensive symptoms -Educated on BP goals and benefits of medications for prevention of heart attack, stroke and kidney damage; Daily salt intake goal < 2300 mg; Exercise goal of 150 minutes per week; Importance of home blood pressure monitoring; Proper BP monitoring technique; Symptoms of hypotension and importance of maintaining adequate hydration; -Counseled to monitor BP at home daily, document, and provide log at future appointments -Counseled on diet and exercise extensively Recommended to continue current medication  Hyperlipidemia: (LDL goal < 70)/ CAD -Controlled  LDLCALC 75 07/01/2020 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 Slightly elevated than goal, discussed dietary modifications with patient - No s/s ischemia On statin therapy, no ASA due to warfarin  use -Current treatment: Atorvastatin 20 mg 1 tablet three times a week (appropriate, effective, safe, accessible) -Medications previously tried: N/A  -Current dietary patterns: Limits cholesterol -Current exercise habits: Limited due to anemia -Educated on Cholesterol goals;  Benefits of statin for ASCVD risk reduction; Importance of limiting foods high in cholesterol; Exercise goal of 150 minutes per week; -Counseled on diet and exercise extensively Recommended to continue current medication Recommended starting exercise as anemia resolves and keeping closer eye on dietary intake of cholesterol  Diabetes (A1c goal <7%) -Controlled -Current medications: Farxiga 5 mg daily (appropriate, effective, safe, query accessible) Insulin NPH-regular Human 70-30, inject 45 units into the skin daily with breakfast, 35 units in the evening (appropriate, effective, safe, accessible) -Medications previously tried: N/A  Retinopathy 09/30/2018 -Current home glucose readings Checks daily and they are transmitted to [facility] fasting glucose: 64-118, average mid 90's post prandial glucose: N/A -Reports hypoglycemic/hyperglycemic symptoms -Current meal patterns:  Limits carbs and sugars -Current exercise: Limited due to anemia -Educated on A1c and blood sugar goals; Complications of diabetes including kidney damage, retinal damage, and cardiovascular disease; Exercise goal of 150 minutes per week; Benefits of weight loss; Proper insulin injection technique; Prevention and management of hypoglycemic episodes; Benefits of routine self-monitoring of blood sugar;  Carbohydrate counting and/or plate method Counseled to check feet daily and get yearly eye exams -Counseled to check feet daily and get yearly eye exams -Counseled on diet and exercise extensively Recommended to continue current medication Counseled on benefits of exercise maintaining BG control Collaborated with ophthalmologist to obtain eye  exam results for 2022 -CCM team to look into Farxiga PAP for patient  Atrial Fibrillation (Goal: prevent stroke and major bleeding) -Controlled Paroxysmal atrial fibrillation AFL s/p ablation in 10/15 05/30/2020: NSR Had bleeding due to DOAC, remains on warfarin -CHADSVASC: 5 -Current treatment: Rate control: Carvedilol 6.25 mg 1/2 tablet am 1/2 tablet pm (appropriate, effective, safe, accessible) Anticoagulation: Warfarin 5 mg UD (appropriate, effective, safe, accessible) PT / INR = 2.1 -Medications previously tried: N/A -Home BP and HR readings: checks daily and sends to [facility] for review, unable to recall at this time  -Counseled on increased risk of stroke due to Afib and benefits of anticoagulation for stroke prevention; importance of adherence to anticoagulant exactly as prescribed; bleeding risk associated with warfarin and importance of self-monitoring for signs/symptoms of bleeding; avoidance of NSAIDs due to increased bleeding risk with anticoagulants; importance of regular laboratory monitoring; seeking medical attention after a head injury or if there is blood in the urine/stool; -Recommended to continue current medication Educated on reducing risks of bleed, dietary restrictions with warfarin, and monitoring for S/Sx of bleeds  Heart Failure (Goal: manage symptoms and prevent exacerbations) Echo 02/20/19 LVEF 60-65% RV markedly dilated Echo 12/18 LVEF 60% RV dilated mildly HK 07/03/2019: B Natriuretic Peptide 264.2 Volume status mildly elevated, Farxiga was added 05/30/2020 -Controlled -Last ejection fraction: 60-65% (Date: 02/20/2019) -HF type: Diastolic -NYHA Class: II (slight limitation of activity) - IIIb -AHA HF Stage: C (Heart disease and symptoms present) -Current treatment: Torsemide 100 1.5 tablets daily (appropriate, effective, safe, accessible) Metolazone 2.5 mg daily PRN (appropriate, effective, safe, accessible) Farxiga 5 mg daily (appropriate, effective, safe,  accessible) Carvedilol 6.25 mg 1/2 qam and 1/2 qpm (appropriate, effective, safe, accessible) -Medications previously tried: N/A  -Current home BP/HR readings: controlled -Current dietary habits: Balanced, limited sugar and salt -Current exercise habits: Limited due to anemia  -Educated on Benefits of medications for managing symptoms and prolonging life Importance of weighing daily; if you gain more than 3 pounds in one day or 5 pounds in one week, call CPP or PCP or cardiologist Proper diuretic administration and potassium supplementation Importance of blood pressure control -Counseled on diet and exercise extensively Recommended to continue current medication  Depression/Anxiety -Controlled -Current treatment: Sertraline 100 mg daily (appropriate, effective, safe, accessible) Alprazolam 1 mg 1/2 tablet HS PRN (appropriate, effective, safe, accessible) -Medications previously tried/failed: N/A -PHQ9: 04/29/2020: 0 -GAD7: N/A -Educated on Benefits of medication for symptom control -Recommended to continue current medication  GERD -Controlled -Current treatment  Gaviscon PRN (appropriate, effective, safe, accessible) Not currently taking pantoprazole and is controlled -Medications previously tried: N/A  -Counseled on diet and exercise extensively Recommended to continue current medication  Chronic Kidney Disease Stage 3b  -All medications assessed for renal dosing and appropriateness in chronic kidney disease. -Counseled on avoiding salt and nephrotoxic medications (NSAIDs) and maintaining BP and BG control  Gout  -Controlled -Current treatment  Allopurinol 300 mg daily (appropriate, effective, safe, accessible) -Medications previously tried: N/A  Last gout attack: "quite a while ago" Counseled on avoiding shellfish and red meat Followed by rheumatologist -Counseled on diet and exercise extensively Recommended to continue current medication   Osteoarthritis   -Controlled -Current treatment  Hydrocodone/APAP  5-325 mg PRN (appropriate, effective, safe, accessible) -Medications previously tried: N/A  -Counseled on diet and exercise extensively Recommended to continue current medication  Patient Goals/Self-Care Activities Patient will:  - take medications as prescribed  Follow Up Plan: Telephone follow up appointment with care management team member scheduled for: 12/6       Medication Assistance:  Inquiring if patient qualifies for PAP for Farxiga  Compliance/Adherence/Medication fill history: Care Gaps: Shingrix, Covid -19, Opthalmology exam and Influenza Vaccine  Star-Rating Drugs: Atorvastatin 46m, Start 04/21/20, [facility] Mail service  Patient's preferred pharmacy is:  [facility]Phone: [phone]: [phone] [id number] [place]: [phone]: [phone] pill box? Yes Pt endorses 100% compliance  We discussed: Current pharmacy is preferred with insurance plan and patient is satisfied with pharmacy services Patient decided to: Continue current medication management strategy  Care Plan and Follow Up Patient Decision:  Patient agrees to Care Plan and Follow-up.  Plan: Telephone follow up appointment with care management team member scheduled for:  12/10/2020

## 2020-08-07 NOTE — Progress Notes (Signed)
============================================================ -   Test results slightly outside the reference range are not unusual. If there is anything important, I will review this with you,  otherwise it is considered normal test values.  If you have further questions,  please do not hesitate to contact me at the office or via My Chart.  ============================================================ ============================================================  -  PT / INR = 2.1 x - Perfect - Please stay on Coumadin 5 mg  /day   ============================================================ ============================================================  -  CBC - much better - shows Red Cell Ct  / Hgb up from 9.7 gm%                                                                                 to now 11.9 gm %  - Great !  ============================================================ ============================================================  -  Kidney functions still in Stage 3b  -  look a little better  ============================================================ ============================================================

## 2020-08-14 ENCOUNTER — Encounter (INDEPENDENT_AMBULATORY_CARE_PROVIDER_SITE_OTHER): Payer: Medicare Other | Admitting: Ophthalmology

## 2020-08-14 ENCOUNTER — Other Ambulatory Visit: Payer: Self-pay

## 2020-08-14 DIAGNOSIS — H353132 Nonexudative age-related macular degeneration, bilateral, intermediate dry stage: Secondary | ICD-10-CM

## 2020-08-14 DIAGNOSIS — H338 Other retinal detachments: Secondary | ICD-10-CM | POA: Diagnosis not present

## 2020-08-14 DIAGNOSIS — H34832 Tributary (branch) retinal vein occlusion, left eye, with macular edema: Secondary | ICD-10-CM

## 2020-08-14 DIAGNOSIS — I10 Essential (primary) hypertension: Secondary | ICD-10-CM

## 2020-08-14 DIAGNOSIS — H35033 Hypertensive retinopathy, bilateral: Secondary | ICD-10-CM | POA: Diagnosis not present

## 2020-08-14 DIAGNOSIS — H43813 Vitreous degeneration, bilateral: Secondary | ICD-10-CM | POA: Diagnosis not present

## 2020-08-19 ENCOUNTER — Other Ambulatory Visit: Payer: Self-pay

## 2020-08-19 DIAGNOSIS — N184 Chronic kidney disease, stage 4 (severe): Secondary | ICD-10-CM

## 2020-08-19 DIAGNOSIS — E1122 Type 2 diabetes mellitus with diabetic chronic kidney disease: Secondary | ICD-10-CM

## 2020-08-19 MED ORDER — DEXCOM G6 RECEIVER DEVI: 0 refills | Status: AC

## 2020-08-19 MED ORDER — DEXCOM G6 TRANSMITTER MISC: 0 refills | Status: AC

## 2020-08-19 MED ORDER — DEXCOM G6 SENSOR MISC: 0 refills | Status: AC

## 2020-08-29 ENCOUNTER — Telehealth: Payer: Self-pay

## 2020-08-29 NOTE — Progress Notes (Signed)
Name: [name].  MRN: [id number] DOB: 29-May-1940  Called patient to connect with him to gather information and go over questions to start the assistance process to get his [place] prescription more affordable. Left voicemail for patient to return call.   Patient returned call and I went over several questions with him to determine coverage.Under the social security website he was dimmed ineligible. Also on [place] the manufactures patent assistance. I advised the patient that I would look into more resources and get back with him if I was able to find anymore. All assistance resources have been exhausted.He verbalized understanding. CPP made aware. Medications: Outpatient Encounter Medications as of 08/29/2020  Medication Sig Note   ACCU-CHEK AVIVA PLUS test strip CHECK BLOOD GLUCOSE 3 TIMES DAILY    acetaminophen (TYLENOL) 500 MG tablet Take 1,000 mg by mouth every 6 (six) hours as needed for moderate pain or headache.     allopurinol (ZYLOPRIM) 300 MG tablet Take 300 mg by mouth daily. 06/10/2020: LF 04/13   ALPRAZolam (XANAX) 1 MG tablet Take 0.5 tablets (0.5 mg total) by mouth at bedtime as needed for sleep.    Alum Hydroxide-Mag Carbonate (GAVISCON PO) Take 4 tablets by mouth daily as needed (acid reflux).     Ascorbic Acid (VITAMIN C) 1000 MG tablet Take 1,000 mg by mouth daily.    atorvastatin (LIPITOR) 20 MG tablet Take  1 tablet  3 x /week  for Cholesterol 06/10/2020: LF 02/14   azelastine (ASTELIN) 0.1 % nasal spray Place 2 sprays into both nostrils 2 (two) times daily as needed for rhinitis or allergies. 06/10/2020: LF 04/27   B Complex Vitamins (VITAMIN B COMPLEX PO) Take 1 tablet by mouth at bedtime.     bacitracin 500 UNIT/GM ointment Apply 1 application topically daily as needed for wound care.    Blood Glucose Monitoring Suppl (ACCU-CHEK AVIVA PLUS) w/Device KIT Check blood sugar 1 time  daily    Blood Glucose Monitoring Suppl DEVI Test blood sugar up to three times a day or as  directed.    carvedilol (COREG) 6.25 MG tablet TAKE ONE-HALF TABLET BY  MOUTH IN THE MORNING AND 1  TABLET BY MOUTH IN THE  EVENING    Cholecalciferol (VITAMIN D PO) Take 5,000 Units by mouth daily.     Continuous Blood Gluc Receiver (DEXCOM G6 RECEIVER) DEVI Use to check blood sugar multiple times a day for Hypoglycemia and Hyperglycemia    Continuous Blood Gluc Sensor (DEXCOM G6 SENSOR) MISC Use to check blood sugar multiple times a day for Hypoglycemia and Hyperglycemia    Continuous Blood Gluc Transmit (DEXCOM G6 TRANSMITTER) MISC Use to check blood sugar multiple times a day for Hypoglycemia and Hyperglycemia    dapagliflozin propanediol (FARXIGA) 5 MG TABS tablet Take 1 tablet (5 mg total) by mouth daily before breakfast.    dexamethasone (DECADRON) 4 MG tablet Take 1 tab 3 x day - 3 days, then 2 x day - 3 days, then 1 tab daily (Patient not taking: Reported on 08/07/2020)    diphenhydrAMINE (BENADRYL) 25 MG tablet Take 25 mg by mouth at bedtime.    fluticasone (FLONASE) 50 MCG/ACT nasal spray Place 1 spray into both nostrils daily as needed for allergies.    insulin NPH-regular Human (70-30) 100 UNIT/ML injection Inject 45 Units into the skin daily with breakfast. 35 units in the evening    Iron-FA-B Cmp-C-Biot-Probiotic (FUSION PLUS) CAPS Take 1 capsule by mouth daily.    loratadine (CLARITIN)  10 MG tablet Take 10 mg by mouth daily as needed for allergies.     metolazone (ZAROXOLYN) 2.5 MG tablet Take 2.5 mg by mouth daily. Only as needed for CHF related weight gain    Multiple Vitamins-Minerals (PRESERVISION AREDS 2) CAPS Take 1 capsule by mouth 2 (two) times daily.     nystatin (MYCOSTATIN) 100000 UNIT/ML suspension Take 5 mLs (500,000 Units total) by mouth 4 (four) times daily.    polyvinyl alcohol (LIQUIFILM TEARS) 1.4 % ophthalmic solution Place 1 drop into both eyes daily as needed for dry eyes.    potassium chloride SA (KLOR-CON) 20 MEQ tablet TAKE 2 TABLETS BY MOUTH 3  TIMES DAILY FOR  POTASSIUM 06/10/2020: LF 03/13   sertraline (ZOLOFT) 100 MG tablet TAKE 1 TABLET BY MOUTH  DAILY FOR MOOD 06/10/2020: LF 03/14   torsemide (DEMADEX) 100 MG tablet Take 150 mg by mouth daily. 1.5 tablet 06/10/2020: LF 05/23   warfarin (COUMADIN) 5 MG tablet Take    1.5 tablets /day      or as directed 06/10/2020: Took 5m 06/05   zinc gluconate 50 MG tablet Take 50 mg by mouth daily.    No facility-administered encounter medications on file as of 08/29/2020.      EHildred Alamin Health Concierge [phone]

## 2020-09-04 DIAGNOSIS — E1122 Type 2 diabetes mellitus with diabetic chronic kidney disease: Secondary | ICD-10-CM | POA: Diagnosis not present

## 2020-09-04 DIAGNOSIS — I10 Essential (primary) hypertension: Secondary | ICD-10-CM | POA: Diagnosis not present

## 2020-09-05 NOTE — Progress Notes (Signed)
ONE MONTH FOLLOW UP   Assessment and Plan:  PAF (paroxysmal atrial fibrillation) (HCC)/ Chronic Anticoagulation Taking Coumadin 38m 1.5 tablets daily in evening Goal 2-2.5 Check INR and will adjust medication according to labs Discussed if patient falls to immediately contact office or go to ER. Discussed foods that can increase or decrease Coumadin levels. Repeat PT/INR in 4 weeks or sooner pending results  Chronic diastolic congestive heart failure ([place] Encouraged daily monitoring of the patient's weight Continue follow up with cardiology Continue Carvedilol 6.218mone half tab in am and 1 tab in pm ,torsemide/PRN metolazone, potassium  Senile purpura (HCSinclairDiscussed process, protect skin, sunscreen  Essential Hypertension Continue current medications Monitor blood pressure at home; call if consistently over 130/80 Continue DASH diet.   Reminder to go to the ER if any CP, SOB, nausea, dizziness, severe HA, changes vision/speech, left arm numbness and tingling and jaw pain.  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 BhCarolin SicksCongestion of nasal sinus Left side/ foreign body sensation  No improvement with antihistamine, nasal steroid persistent 6+ months; will refer to ENT to r/o polyp  Shingrix vaccination  Pt is interested in getting Shingrix vaccine, advised to call insurance first to determine cost and that [facility]  Further disposition pending results of labs. Discussed med's effects and SE's.   Over 20 minutes of face to face interview exam, counseling, chart review, and critical decision making was performed.   Future Appointments  Date Time Provider DeNina9/09/2020 11:40 AM Bensimhon, DaShaune PascalMD [facility] None  09/25/2020 12:45 PM MaHayden PedroMD [facility] None  10/02/2020  9:45 AM [facility] LAB [facility] None  10/02/2020 10:20 AM KaBrunetta GeneraMD [facility] None   10/02/2020 11:15 AM [facility] INFUSION [facility] None  10/23/2020 11:00 AM McUnk PintoMD [facility] None  11/14/2020  3:30 PM McUnk PintoMD [facility] None  12/10/2020  3:00 PM EjRush LandmarkRPH [facility] None  02/26/2021  3:30 PM CoLiane ComberNP [facility] None    ------------------------------------------------------------------------------------------------------------------   HPI BP (!) 116/58   Pulse 71   Temp (!) 97.5 F (36.4 C)   Wt 180 lb 12.8 oz (82 kg)   SpO2 97%   BMI 25.22 kg/m   80 y.o.male presents for follow up on coumadin for p. A. fib, CHF, HTN, senile purpura  Patient has complex cardiac history followed by Dr. BeSung AmabileA. Fib, a. Flutter, CHF, hypertensive cardiomyopathy, PFO, pulmonary hypertension. Cardiology is treating carvedilol 6.25 mg - takes 1/2 tab AM and full tab PM due to hypotension.  He does weigh daily at home, 172 - 185 lb, will take metolazone 2.5 mg if 184+ Wt Readings from Last 5 Encounters:  09/10/20 180 lb 12.8 oz (82 kg)  08/06/20 177 lb 3.2 oz (80.4 kg)  07/17/20 182 lb (82.6 kg)  07/02/20 179 lb (81.2 kg)  07/01/20 178 lb 12.8 oz (81.1 kg)   Lab Results  Component Value Date   CREATININE 1.83 (H) 08/06/2020   BUN 27 (H) 08/06/2020   NA 138 08/06/2020   K 4.5 08/06/2020   CL 102 08/06/2020   CO2 26 08/06/2020   Patient is on Coumadin for PAfib with goal of 2-2.5 related to vitrous hemorrhage, right eye. He has not taken any antibiotics or missed any doses.   He is currently taking 5 mg daily Patient's last INR was:   Lab Results  Component  Value Date   INR 2.1 (H) 08/06/2020   INR 1.7 (H) 07/01/2020   INR 3.8 (H) 06/18/2020    He has seen Dr. Irene Limbo for anemia,  He has had iron infusion that helped resolved dizziness.   He is also wearing compression stockings. He was unable to tolerate oral iron supplement due to severe constipation.  Lab Results  Component Value Date   IRON 103 07/02/2020   TIBC 325  07/02/2020   FERRITIN 55 07/02/2020   He has CKD IIIb associated with htn, T2DM, follows with nephrology (Dr. Carolin Sicks) Lab Results  Component Value Date   GFRNONAA 31 (L) 07/01/2020    Lab Results  Component Value Date   CREATININE 1.83 (H) 08/06/2020   CREATININE 1.96 (H) 07/01/2020   CREATININE 2.24 (H) 06/18/2020   Left side of nose has feeling there is something in the nostril. Will refer back to ENT for evaluation of possible polyp.  Has iron infusion scheduled 10/02/2020 CBC Latest Ref Rng & Units 08/06/2020 07/01/2020 06/18/2020  WBC 3.8 - 10.8 Thousand/uL 9.1 8.9 9.6  Hemoglobin 13.2 - 17.1 g/dL 11.9(L) 9.7(L) 9.7(L)  Hematocrit 38.5 - 50.0 % 37.8(L) 30.6(L) 32.3(L)  Platelets 140 - 400 Thousand/uL 313 246 262    Lab Results  Component Value Date   CALCIUM 8.8 08/06/2020     Past Medical History:  Diagnosis Date   Adrenal adenoma    Arthritis    Atypical atrial flutter ([place]) 8/15, 10/15   a. DCCV 08/2013. b. s/p RFA 10/2013.   Basal cell carcinoma    CAD (coronary artery disease)    a. 04/2013 CABG x 2: LIMA to LAD, SVG to RI, EVH via R thigh.   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 s/p MAZE 04/2013 in setting of CABG. b. Amio stopped in 10/2013 after flutter ablation.   PFO (patent foramen  ovale)    a. Small PFO by TEE 10/2013.   Pleural effusion, left    a. s/p thoracentesis 05/2013.   Respiratory failure ([place])    a. Hypoxia 10/2013 - required supp O2 as inpatient, did not require it at discharge.   S/P Maze operation for atrial fibrillation    a. 04/2013: Complete bilateral atrial lesion set using cryothermy and bipolar radiofrequency ablation with clipping of LA appendage (@ time of CABG)     Allergies  Allergen Reactions   Sunflower Oil Swelling and Other (See Comments)    Tongue and lip swelling Other reaction(s): Other Tongue and lip swelling   Horse-Derived Products Other (See Comments)    Per allergy skin test UNSPECIFIED REACTION  Other reaction(s): Other Per allergy skin test UNSPECIFIED REACTION    Immune Globulin  Tetanus Toxoids Other (See Comments)    Per allergy skin test   Tetanus Toxoid     Other reaction(s): Other (See Comments) Rash(horse serum) Other reaction(s): Other Rash(horse serum)     Current Outpatient Medications (Endocrine & Metabolic):    dapagliflozin propanediol (FARXIGA) 5 MG TABS tablet, Take 1 tablet (5 mg total) by mouth daily before breakfast.   insulin NPH-regular Human (70-30) 100 UNIT/ML injection, Inject 45 Units into the skin daily with breakfast. 35 units in the evening  Current Outpatient Medications (Cardiovascular):    atorvastatin (LIPITOR) 20 MG tablet, Take  1 tablet  3 x /week  for Cholesterol   carvedilol (COREG) 6.25 MG tablet, TAKE ONE-HALF TABLET BY  MOUTH IN THE MORNING AND 1  TABLET BY MOUTH IN THE  EVENING   metolazone (ZAROXOLYN) 2.5 MG tablet, Take 2.5 mg by mouth daily. Only as needed for CHF related weight gain   torsemide (DEMADEX) 100 MG tablet, Take 150 mg by mouth daily. 1.5 tablet  Current Outpatient Medications (Respiratory):    azelastine (ASTELIN) 0.1 % nasal spray, Place 2 sprays into both nostrils 2 (two) times daily as needed for rhinitis or allergies.   diphenhydrAMINE (BENADRYL) 25 MG  tablet, Take 25 mg by mouth at bedtime.   fluticasone (FLONASE) 50 MCG/ACT nasal spray, Place 1 spray into both nostrils daily as needed for allergies.   loratadine (CLARITIN) 10 MG tablet, Take 10 mg by mouth daily as needed for allergies.   Current Outpatient Medications (Analgesics):    acetaminophen (TYLENOL) 500 MG tablet, Take 1,000 mg by mouth every 6 (six) hours as needed for moderate pain or headache.    allopurinol (ZYLOPRIM) 300 MG tablet, Take 300 mg by mouth daily.  Current Outpatient Medications (Hematological):    Iron-FA-B Cmp-C-Biot-Probiotic (FUSION PLUS) CAPS, Take 1 capsule by mouth daily.   warfarin (COUMADIN) 5 MG tablet, Take    1.5 tablets /day      or as directed  Current Outpatient Medications (Other):    ACCU-CHEK AVIVA PLUS test strip, CHECK BLOOD GLUCOSE 3 TIMES DAILY   ALPRAZolam (XANAX) 1 MG tablet, Take 0.5 tablets (0.5 mg total) by mouth at bedtime as needed for sleep.   Alum Hydroxide-Mag Carbonate (GAVISCON PO), Take 4 tablets by mouth daily as needed (acid reflux).    Ascorbic Acid (VITAMIN C) 1000 MG tablet, Take 1,000 mg by mouth daily.   B Complex Vitamins (VITAMIN B COMPLEX PO), Take 1 tablet by mouth at bedtime.    bacitracin 500 UNIT/GM ointment, Apply 1 application topically daily as needed for wound care.   Blood Glucose Monitoring Suppl (ACCU-CHEK AVIVA PLUS) w/Device KIT, Check blood sugar 1 time  daily   Blood Glucose Monitoring Suppl DEVI, Test blood sugar up to three times a day or as directed.   Cholecalciferol (VITAMIN D PO), Take 5,000 Units by mouth daily.    Continuous Blood Gluc Receiver ([place]) DEVI, Use to check blood sugar multiple times a day for Hypoglycemia and Hyperglycemia   Continuous Blood Gluc Sensor (DEXCOM G6 SENSOR) MISC, Use to check blood sugar multiple times a day for Hypoglycemia and Hyperglycemia   Continuous Blood Gluc Transmit (DEXCOM G6 TRANSMITTER) MISC, Use to check blood sugar multiple times a day for  Hypoglycemia and Hyperglycemia   Multiple Vitamins-Minerals (PRESERVISION AREDS 2) CAPS, Take 1 capsule by mouth 2 (two) times daily.    polyvinyl alcohol (LIQUIFILM TEARS) 1.4 % ophthalmic solution, Place 1 drop into both eyes daily as  needed for dry eyes.   potassium chloride SA (KLOR-CON) 20 MEQ tablet, TAKE 2 TABLETS BY MOUTH 3  TIMES DAILY FOR POTASSIUM   sertraline (ZOLOFT) 100 MG tablet, TAKE 1 TABLET BY MOUTH  DAILY FOR MOOD   zinc gluconate 50 MG tablet, Take 50 mg by mouth daily.  Review of Systems  Constitutional:  Negative for chills and fever.  HENT:  Positive for congestion (feeling of something foreign in left nare). Negative for hearing loss, sinus pain, sore throat and tinnitus.   Eyes:  Negative for blurred vision and double vision.  Respiratory:  Negative for cough, hemoptysis, sputum production, shortness of breath and wheezing.   Cardiovascular:  Negative for chest pain, palpitations and leg swelling.  Gastrointestinal:  Negative for abdominal pain, constipation, diarrhea, heartburn, nausea and vomiting.  Genitourinary:  Negative for dysuria and urgency.  Musculoskeletal:  Negative for back pain, falls, joint pain, myalgias and neck pain.  Skin:  Negative for rash.       Small superficial skin tear on right elbow  Neurological:  Negative for dizziness, tingling, tremors, weakness and headaches.  Endo/Heme/Allergies:  Bruises/bleeds easily.  Psychiatric/Behavioral:  Negative for depression and suicidal ideas. The patient is not nervous/anxious and does not have insomnia.     Physical Exam:  BP (!) 116/58   Pulse 71   Temp (!) 97.5 F (36.4 C)   Wt 180 lb 12.8 oz (82 kg)   SpO2 97%   BMI 25.22 kg/m   General Appearance: Well nourished, in no apparent distress. Eyes: PERRLA, EOMs, conjunctiva no swelling or erythema Sinuses: No Frontal/maxillary tenderness ENT/Mouth: Ext aud canals clear, TMs without erythema, bulging. No erythema, swelling, or exudate on post  pharynx.  Tonsils not swollen or erythematous. Hearing normal.  Neck: Supple, thyroid normal.  Respiratory: Respiratory effort normal, BS equal bilaterally without rales, rhonchi, wheezing or stridor.  Cardio: RRR with no MRGs, systolic murmur. Brisk peripheral pulses with trace bilateral edema.  Abdomen: Soft, obese/mildly distended, + BS.  Non tender, no guarding, rebound, hernias, masses. Lymphatics: Non tender without lymphadenopathy.   Musculoskeletal: Full ROM, 5/5 strength, normal gait.  Skin: Warm, dry without rashes, lesions; he has fragile skin and numerous small ecchymoses to bilateral upper extremities. Right elbow has superficial laceration with ecchymosis, well approximated, no signs of infection Neuro: Cranial nerves intact. Normal muscle tone, no cerebellar symptoms. Sensation intact.  Psych: Awake and oriented X 3, normal affect, Insight and Judgment appropriate.   Magda Bernheim, NP 4:04 PM [facility]

## 2020-09-10 ENCOUNTER — Encounter: Payer: Self-pay | Admitting: Nurse Practitioner

## 2020-09-10 ENCOUNTER — Ambulatory Visit (INDEPENDENT_AMBULATORY_CARE_PROVIDER_SITE_OTHER): Payer: Medicare Other | Admitting: Nurse Practitioner

## 2020-09-10 ENCOUNTER — Other Ambulatory Visit: Payer: Self-pay

## 2020-09-10 VITALS — BP 116/58 | HR 71 | Temp 97.5°F | Wt 180.8 lb

## 2020-09-10 DIAGNOSIS — Z7901 Long term (current) use of anticoagulants: Secondary | ICD-10-CM

## 2020-09-10 DIAGNOSIS — I5032 Chronic diastolic (congestive) heart failure: Secondary | ICD-10-CM

## 2020-09-10 DIAGNOSIS — N183 Chronic kidney disease, stage 3 unspecified: Secondary | ICD-10-CM

## 2020-09-10 DIAGNOSIS — I4892 Unspecified atrial flutter: Secondary | ICD-10-CM

## 2020-09-10 DIAGNOSIS — E1122 Type 2 diabetes mellitus with diabetic chronic kidney disease: Secondary | ICD-10-CM

## 2020-09-10 DIAGNOSIS — R0981 Nasal congestion: Secondary | ICD-10-CM

## 2020-09-10 DIAGNOSIS — D692 Other nonthrombocytopenic purpura: Secondary | ICD-10-CM

## 2020-09-10 DIAGNOSIS — I480 Paroxysmal atrial fibrillation: Secondary | ICD-10-CM

## 2020-09-10 DIAGNOSIS — I10 Essential (primary) hypertension: Secondary | ICD-10-CM | POA: Diagnosis not present

## 2020-09-10 NOTE — Addendum Note (Signed)
Addended by: Magda Bernheim on: 09/10/2020 04:07 PM   Modules accepted: Orders

## 2020-09-11 LAB — PROTIME-INR
INR: 1.2 — ABNORMAL HIGH
Prothrombin Time: 12.5 s — ABNORMAL HIGH (ref 9.0–11.5)

## 2020-09-13 ENCOUNTER — Ambulatory Visit (HOSPITAL_COMMUNITY)
Admission: RE | Admit: 2020-09-13 | Discharge: 2020-09-13 | Disposition: A | Discharge status: Home / Self Care | Payer: Medicare Other | Source: Ambulatory Visit | Attending: Internal Medicine | Admitting: Internal Medicine

## 2020-09-13 ENCOUNTER — Other Ambulatory Visit: Payer: Self-pay

## 2020-09-13 VITALS — BP 120/66 | HR 66 | Ht 71.0 in | Wt 178.2 lb

## 2020-09-13 DIAGNOSIS — N1832 Chronic kidney disease, stage 3b: Secondary | ICD-10-CM | POA: Diagnosis not present

## 2020-09-13 DIAGNOSIS — Z9989 Dependence on other enabling machines and devices: Secondary | ICD-10-CM | POA: Diagnosis not present

## 2020-09-13 DIAGNOSIS — Z794 Long term (current) use of insulin: Secondary | ICD-10-CM | POA: Diagnosis not present

## 2020-09-13 DIAGNOSIS — Z87891 Personal history of nicotine dependence: Secondary | ICD-10-CM | POA: Diagnosis not present

## 2020-09-13 DIAGNOSIS — Z79899 Other long term (current) drug therapy: Secondary | ICD-10-CM | POA: Diagnosis not present

## 2020-09-13 DIAGNOSIS — I5032 Chronic diastolic (congestive) heart failure: Secondary | ICD-10-CM | POA: Diagnosis not present

## 2020-09-13 DIAGNOSIS — Z8249 Family history of ischemic heart disease and other diseases of the circulatory system: Secondary | ICD-10-CM | POA: Diagnosis not present

## 2020-09-13 DIAGNOSIS — Z7901 Long term (current) use of anticoagulants: Secondary | ICD-10-CM | POA: Insufficient documentation

## 2020-09-13 DIAGNOSIS — G4733 Obstructive sleep apnea (adult) (pediatric): Secondary | ICD-10-CM | POA: Diagnosis not present

## 2020-09-13 DIAGNOSIS — N183 Chronic kidney disease, stage 3 unspecified: Secondary | ICD-10-CM | POA: Diagnosis not present

## 2020-09-13 DIAGNOSIS — Z951 Presence of aortocoronary bypass graft: Secondary | ICD-10-CM | POA: Insufficient documentation

## 2020-09-13 DIAGNOSIS — I272 Pulmonary hypertension, unspecified: Secondary | ICD-10-CM | POA: Diagnosis not present

## 2020-09-13 DIAGNOSIS — E1122 Type 2 diabetes mellitus with diabetic chronic kidney disease: Secondary | ICD-10-CM | POA: Diagnosis not present

## 2020-09-13 DIAGNOSIS — Z9049 Acquired absence of other specified parts of digestive tract: Secondary | ICD-10-CM | POA: Diagnosis not present

## 2020-09-13 DIAGNOSIS — Z887 Allergy status to serum and vaccine status: Secondary | ICD-10-CM | POA: Diagnosis not present

## 2020-09-13 DIAGNOSIS — I480 Paroxysmal atrial fibrillation: Secondary | ICD-10-CM | POA: Diagnosis not present

## 2020-09-13 DIAGNOSIS — I130 Hypertensive heart and chronic kidney disease with heart failure and stage 1 through stage 4 chronic kidney disease, or unspecified chronic kidney disease: Secondary | ICD-10-CM | POA: Insufficient documentation

## 2020-09-13 DIAGNOSIS — I2510 Atherosclerotic heart disease of native coronary artery without angina pectoris: Secondary | ICD-10-CM | POA: Diagnosis not present

## 2020-09-13 LAB — BASIC METABOLIC PANEL
Anion gap: 6 (ref 5–15)
BUN: 29 mg/dL — ABNORMAL HIGH (ref 8–23)
CO2: 28 mmol/L (ref 22–32)
Calcium: 8.9 mg/dL (ref 8.9–10.3)
Chloride: 106 mmol/L (ref 98–111)
Creatinine, Ser: 1.73 mg/dL — ABNORMAL HIGH (ref 0.61–1.24)
GFR, Estimated: 39 mL/min — ABNORMAL LOW (ref >60)
Glucose, Bld: 193 mg/dL — ABNORMAL HIGH (ref 70–99)
Potassium: 5.1 mmol/L (ref 3.5–5.1)
Sodium: 140 mmol/L (ref 135–145)

## 2020-09-13 LAB — BRAIN NATRIURETIC PEPTIDE: B Natriuretic Peptide: 549 pg/mL — ABNORMAL HIGH (ref 0.0–100.0)

## 2020-09-13 NOTE — Patient Instructions (Signed)
No medication changes today!  You have been referred to pulmonary rehab.  You will receive a call to schedule an appointment  Labs today We will only contact you if something comes back abnormal or we need to make some changes. Otherwise no news is good news!  Your physician recommends that you schedule a follow-up appointment in: 6 months.  You will get a call to schedule this appointment.   Please call office at [phone] option [phone] if you have any questions or concerns.  At the [facility], you and your health needs are our priority. As part of our continuing mission to provide you with exceptional heart care, we have created designated Provider Care Teams. These Care Teams include your primary Cardiologist (physician) and Advanced Practice Providers (APPs- Physician Assistants and Nurse Practitioners) who all work together to provide you with the care you need, when you need it.   You may see any of the following providers on your designated Care Team at your next follow up: Dr Glori Bickers Dr Loralie Champagne Dr Patrice Paradise, NP Lyda Jester, [place] Ginnie Smart Audry Riles, PharmD   Please be sure to bring in all your medications bottles to every appointment.

## 2020-09-13 NOTE — Addendum Note (Signed)
Encounter addended by: Maple Mirza, RN on: 09/13/2020 12:44 PM  Actions taken: Visit diagnoses modified, Order list changed, Diagnosis association updated, Clinical Note Signed, Charge Capture section accepted

## 2020-09-13 NOTE — Progress Notes (Signed)
Advanced [facility] Failure Clinic Note .  Date:  09/13/2020   ID:  Cory Kettle., DOB 06/06/1940, MRN [id number]  Location: Home  Provider location: [place] [facility] Type of Visit: Established patient  PCP:  [name], MD  Cardiologist:  None Primary HF: [name]  Chief Complaint: Heart Failure follow-up   History of Present Illness:  [name] is an 80 y/o male with COPD , DM, PAF, 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, CKD, AFL s/p ablation in 10/15. 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 Had surgery in 4/15. Did well for a while went to cardiac rehab and was feeling fine.  Developed AFL in 10/15 and underwent RFA.    In 3/16 began to develop severe SOB. Started O2. Says his symptoms got worse almost overnight. 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 He has seen Dr. Roxy Manns but felt to have no optimal surgical options for repair. His case was also presented to Dr. Michaelle Birks at [facility] who agreed that there was no way to baffle or reroute the anomalous PV flow to the LA. He had a TEE which showed LVEF 60-65% with a dilated right side and a small PFO. He has also been seen by Dr. Lake Bells who performed PFTs that showed significant restrictive lung disease with a low DLCO. He had f/u with Dr. Gilles Chiquito in the [facility] who felt his symptoms were multifactorial.   Had colonoscopy in 2019 and found to have a polyp and what sounds like AVMs. Subsequently had a laparoscopic removal of appendiceal adenoma which was benign.    Has had IDA and been getting Feraheme.   Here for routine f/u. Remains on torsemide 1105m in am (instead of 100/50). Takes metolazone about once a month (less frequent than before). Says he is doing ok but feels like he is slowing down. Gets SOB more easily. Edema is better. No orthopnea or PND. No CP. Scr back  down from 2.4 -> 1.8   RHC 7/21  RA = 12 RV = 64/10 PA = 64/17 (32)   PCW = 18 (v=31) Fick cardiac output/index = 4.7/2.3 PVR = 2.2 WU Ao sat = 98% PA sat = 65%, 67% SVC sat = 52% Qp/Qs = 1.4     Cardiac studies:  Echo 02/20/19 LVEF 60-65% RV markedly dilated. Mod HK. RVSP 60.7 mmHg. Personally reviewed   Echo 12/18 LVEF 60% RV dilated mildly HK    PFTs (7/16) FEV1 1.45 L (45%) FVC 1.77 L (40%) DLCO 46%   RHC 4/16 RA = 18 RV = 72/4/17 PA = 76/27 (46) PCW = 21 Fick cardiac output/index (using PA sat)  = 9.2/4.45 Thermo CO/CI =  10.0/4.87 PVR = 2.2 WU Fick cardiac output/index (using high SVC sat) = 5.2/2.5 Pulse-ox saturation  = 89%   High SVC sat = 54% Low SVC sat = 81% (at SVC/RA junction) RA sat = 68% RV sat = 66% PA sat =  68%, 69% IVC sat =56%   VQ/CT negative for PE    TEE 10/15 small PFO   Ab u/s 6/16 liver normal + ascites. Medico renal kidney disease.      HPenhookdenies symptoms worrisome for COVID 19.   Past Medical History:  Diagnosis Date   Adrenal adenoma  Arthritis    Atypical atrial flutter ([place]) 8/15, 10/15   a. DCCV 08/2013. b. s/p RFA 10/2013.   Basal cell carcinoma    CAD (coronary artery disease)    a. 04/2013 CABG x 2: LIMA to LAD, SVG to RI, EVH via R thigh.   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 s/p MAZE 04/2013 in setting of CABG. b. Amio stopped in 10/2013 after flutter ablation.   PFO (patent foramen ovale)    a. Small PFO by TEE 10/2013.   Pleural effusion, left    a. s/p thoracentesis 05/2013.   Respiratory failure ([place])    a. Hypoxia 10/2013 - required supp O2 as inpatient, did not require it at discharge.   S/P Maze operation for atrial fibrillation    a. 04/2013: Complete bilateral atrial lesion set using cryothermy and bipolar radiofrequency ablation with clipping of LA appendage (@ time of CABG)   Past Surgical History:  Procedure Laterality Date   APPENDECTOMY  03/05/2017   laproscopic   ATRIAL FIBRILLATION ABLATION N/A 10/26/2013   Procedure: ATRIAL FIBRILLATION ABLATION;  Surgeon: Coralyn Mark, MD;  Location: [facility] CATH [facility];  Service: Cardiovascular;  Laterality: N/A;   BASAL CELL CARCINOMA EXCISION     x3 on face   BIOPSY  06/12/2020   Procedure: BIOPSY;  Surgeon: Jerene Bears, MD;  Location: [facility];  Service: Gastroenterology;;   CARDIAC CATHETERIZATION     myocardial bridge but no cad   CARDIOVERSION N/A 08/23/2013   Procedure: CARDIOVERSION;  Surgeon: Sanda Klein, MD;  Location: [place];  Service: Cardiovascular;  Laterality: N/A;   CARPOMETACARPEL SUSPENSION PLASTY Left 02/14/2014   Procedure: CARPOMETACARPEL ([place]) SUSPENSIONPLASTY THUMB  WITH  ABDUCTOR POLLICIS LONGUS TRANSFER AND STENOSING TENOSYNOVITIS RELEASE LEFT WRIST;  Surgeon: Charlotte Crumb, MD;  Location: [place];  Service: Orthopedics;  Laterality: Left;   CATARACT EXTRACTION Bilateral    COLONOSCOPY WITH PROPOFOL N/A 01/26/2017   Procedure: COLONOSCOPY WITH PROPOFOL;  Surgeon: Doran Stabler, MD;  Location: [facility];  Service: Gastroenterology;  Laterality: N/A;   CORONARY ARTERY BYPASS GRAFT N/A 04/05/2013   Procedure: CORONARY ARTERY BYPASS GRAFTING (CABG) TIMES TWO USING LEFT INTERNAL MAMMARY ARTERY  AND RIGHT SAPHENOUS LEG VEIN HARVESTED ENDOSCOPICALLY;  Surgeon: Rexene Alberts, MD;  Location: [facility];  Service: Open Heart Surgery;  Laterality: N/A;   ENTEROSCOPY N/A 06/12/2020   Procedure: ENTEROSCOPY;  Surgeon: Jerene Bears, MD;  Location: [facility];  Service: Gastroenterology;  Laterality: N/A;   ESOPHAGOGASTRODUODENOSCOPY (EGD) WITH PROPOFOL N/A 01/26/2017   Procedure: ESOPHAGOGASTRODUODENOSCOPY (EGD) WITH PROPOFOL;  Surgeon: Doran Stabler, MD;  Location: [facility];  Service: Gastroenterology;  Laterality: N/A;  EYE SURGERY Bilateral    Cat Sx   EYE SURGERY Right    RD repair - SB   GREAT TOE ARTHRODESIS, INTERPHALANGEAL JOINT     Right foot   HOT HEMOSTASIS N/A 06/12/2020   Procedure: HOT HEMOSTASIS (ARGON PLASMA COAGULATION/BICAP);  Surgeon: Jerene Bears, MD;  Location: [facility];  Service: Gastroenterology;  Laterality: N/A;   INTRAOPERATIVE TRANSESOPHAGEAL ECHOCARDIOGRAM N/A 04/05/2013   Procedure: INTRAOPERATIVE TRANSESOPHAGEAL ECHOCARDIOGRAM;  Surgeon: Rexene Alberts, MD;  Location: [facility];  Service: Open Heart Surgery;  Laterality: N/A;   LAPAROSCOPIC APPENDECTOMY N/A 03/05/2017   Procedure: APPENDECTOMY LAPAROSCOPIC;  Surgeon: Judeth Horn, MD;  Location: [facility];  Service: General;  Laterality: N/A;   LEFT HEART CATHETERIZATION WITH CORONARY ANGIOGRAM N/A 03/07/2013   Procedure: LEFT HEART CATHETERIZATION WITH CORONARY ANGIOGRAM;  Surgeon: Burnell Blanks, MD;  Location: [facility] CATH LAB;  Service: Cardiovascular;  Laterality: N/A;   MAZE N/A 04/05/2013   Procedure: MAZE;  Surgeon: Rexene Alberts, MD;  Location: [facility];  Service: Open Heart Surgery;  Laterality: N/A;   Polinydal cyst     Removed   POLYPECTOMY     Retina repair-right     RETINAL DETACHMENT SURGERY Right    RD Repair - SB   RIGHT HEART CATH N/A 07/06/2019   Procedure: RIGHT HEART CATH;  Surgeon: Jolaine Artist, MD;  Location: [facility] CV LAB;  Service: Cardiovascular;  Laterality: N/A;   RIGHT  HEART CATHETERIZATION N/A 05/03/2014   Procedure: RIGHT HEART CATH;  Surgeon: Jolaine Artist, MD;  Location: [facility] CATH LAB;  Service: Cardiovascular;  Laterality: N/A;   TEE WITHOUT CARDIOVERSION N/A 08/23/2013   Procedure: TRANSESOPHAGEAL ECHOCARDIOGRAM (TEE);  Surgeon: Sanda Klein, MD;  Location: [facility] ENDOSCOPY;  Service: Cardiovascular;  Laterality: N/A;   TEE WITHOUT CARDIOVERSION N/A 10/26/2013   Procedure: TRANSESOPHAGEAL ECHOCARDIOGRAM (TEE);  Surgeon: Sueanne Margarita, MD;  Location: [facility] ENDOSCOPY;  Service: Cardiovascular;  Laterality: N/A;   TEE WITHOUT CARDIOVERSION N/A 06/05/2014   Procedure: TRANSESOPHAGEAL ECHOCARDIOGRAM (TEE);  Surgeon: Thayer Headings, MD;  Location: [facility];  Service: Cardiovascular;  Laterality: N/A;   TRAPEZIUM RESECTION       Current Outpatient Medications  Medication Sig Dispense Refill   ACCU-CHEK AVIVA PLUS test strip CHECK BLOOD GLUCOSE 3 TIMES DAILY 300 strip 3   acetaminophen (TYLENOL) 500 MG tablet Take 1,000 mg by mouth every 6 (six) hours as needed for moderate pain or headache.      allopurinol (ZYLOPRIM) 300 MG tablet Take 300 mg by mouth daily.     ALPRAZolam (XANAX) 1 MG tablet Take 0.5 tablets (0.5 mg total) by mouth at bedtime as needed for sleep. 60 tablet 0   Alum Hydroxide-Mag Carbonate (GAVISCON PO) Take 4 tablets by mouth daily as needed (acid reflux).      Ascorbic Acid (VITAMIN C) 1000 MG tablet Take 1,000 mg by mouth daily.     atorvastatin (LIPITOR) 20 MG tablet Take  1 tablet  3 x /week  for Cholesterol 39 tablet 3   azelastine (ASTELIN) 0.1 % nasal spray Place 2 sprays into both nostrils 2 (two) times daily as needed for rhinitis or allergies. 30 mL 3   B Complex Vitamins (VITAMIN B COMPLEX PO) Take 1 tablet by mouth at bedtime.      bacitracin 500 UNIT/GM ointment Apply 1 application topically daily as needed for wound care.     Blood Glucose Monitoring Suppl (ACCU-CHEK AVIVA PLUS) w/Device KIT Check blood sugar 1  time  daily 1  kit 0   Blood Glucose Monitoring Suppl DEVI Test blood sugar up to three times a day or as directed. 1 each 0   carvedilol (COREG) 6.25 MG tablet TAKE ONE-HALF TABLET BY  MOUTH IN THE MORNING AND 1  TABLET BY MOUTH IN THE  EVENING 135 tablet 3   Cholecalciferol (VITAMIN D PO) Take 5,000 Units by mouth daily.      colchicine 0.6 MG tablet Take 0.6 mg by mouth every other day.     Continuous Blood Gluc Receiver (DEXCOM G6 RECEIVER) DEVI Use to check blood sugar multiple times a day for Hypoglycemia and Hyperglycemia 1 each 0   Continuous Blood Gluc Sensor (DEXCOM G6 SENSOR) MISC Use to check blood sugar multiple times a day for Hypoglycemia and Hyperglycemia 1 each 0   Continuous Blood Gluc Transmit (DEXCOM G6 TRANSMITTER) MISC Use to check blood sugar multiple times a day for Hypoglycemia and Hyperglycemia 1 each 0   dapagliflozin propanediol (FARXIGA) 5 MG TABS tablet Take 1 tablet (5 mg total) by mouth daily before breakfast. 90 tablet 3   diphenhydrAMINE (BENADRYL) 25 MG tablet Take 25 mg by mouth as needed.     fluticasone (FLONASE) 50 MCG/ACT nasal spray Place 1 spray into both nostrils daily as needed for allergies. 16 g 3   HYDROcodone-acetaminophen (NORCO/VICODIN) 5-325 MG tablet 1 tablet as needed     insulin NPH-regular Human (70-30) 100 UNIT/ML injection Inject 45 Units into the skin daily with breakfast. 25units in the evening     loratadine (CLARITIN) 10 MG tablet Take 10 mg by mouth daily as needed for allergies.      metolazone (ZAROXOLYN) 2.5 MG tablet Take 2.5 mg by mouth daily. Only as needed for CHF related weight gain     metolazone (ZAROXOLYN) 5 MG tablet 1 tablet as needed     Multiple Vitamins-Minerals (PRESERVISION AREDS 2) CAPS Take 1 capsule by mouth 2 (two) times daily.      polyvinyl alcohol (LIQUIFILM TEARS) 1.4 % ophthalmic solution Place 1 drop into both eyes daily as needed for dry eyes.     potassium chloride SA (KLOR-CON) 20 MEQ tablet TAKE 2 TABLETS BY MOUTH 3   TIMES DAILY FOR POTASSIUM 540 tablet 3   sertraline (ZOLOFT) 100 MG tablet TAKE 1 TABLET BY MOUTH  DAILY FOR MOOD 90 tablet 3   torsemide (DEMADEX) 100 MG tablet Take 150 mg by mouth daily. 1.5 tablet     trimethoprim-polymyxin b (POLYTRIM) ophthalmic solution Only when he has eye injections     warfarin (COUMADIN) 10 MG tablet Take 10 mg by mouth 2 (two) times a week.     warfarin (COUMADIN) 5 MG tablet Take    1.5 tablets /day      or as directed (Patient taking differently: Take 1.5 tablets by mouth x 5 days weekly) 135 tablet 3   zinc gluconate 50 MG tablet Take 50 mg by mouth daily.     Iron-FA-B Cmp-C-Biot-Probiotic (FUSION PLUS) CAPS Take 1 capsule by mouth daily. (Patient not taking: Reported on 09/13/2020) 90 capsule 2   No current facility-administered medications for this encounter.    Allergies:   Sunflower oil, Horse-derived products, Other, Immune globulin, Tetanus toxoids, and Tetanus toxoid   Social History:  The patient  reports that he quit smoking about 39 years ago. His smoking use included cigarettes. He has a 100.00 pack-year smoking history. He has never used smokeless tobacco. He reports current alcohol use  of about 1.0 standard drink per week. He reports that he does not use drugs.   Family History:  The patient's family history includes Atrial fibrillation in his mother; Colon cancer in his mother; Colon polyps in his mother and sister; Dementia in his father; Diabetes in his maternal uncle; Hypertension in his mother; Stroke in his paternal uncle.   Vitals:   09/13/20 1146  BP: 120/66  Pulse: 66  SpO2: 97%  Weight: 80.8 kg (178 lb 3.2 oz)  Height: 5' 11"  (1.803 m)    Exam:  General:  Elderly No resp difficulty HEENT: normal Neck: supple. JVP 7-8 Carotids 2+ bilat; no bruits. No lymphadenopathy or thryomegaly appreciated. Cor: PMI nondisplaced. Regular rate & rhythm. 2/6 TR Lungs: clear Abdomen: soft, nontender, nondistended. No hepatosplenomegaly. No bruits  or masses. Good bowel sounds. Extremities: no cyanosis, clubbing, rash, 1+ edema Neuro: alert & orientedx3, cranial nerves grossly intact. moves all 4 extremities w/o difficulty. Affect pleasant   Recent Labs: 06/10/2020: Magnesium 2.8 07/01/2020: TSH 2.07 08/06/2020: ALT 24; BUN 27; Creat 1.83; Hemoglobin 11.9; Platelets 313; Potassium 4.5; Sodium 138  Personally reviewed   Wt Readings from Last 3 Encounters:  09/13/20 80.8 kg (178 lb 3.2 oz)  09/10/20 82 kg (180 lb 12.8 oz)  08/06/20 80.4 kg (177 lb 3.2 oz)      ASSESSMENT AND PLAN:  1. Chronic diastolic HF with R>>L symptoms - Stable NYHA III-IIIb - Volume status looks good - Continue torsemide 14m daily with metolazone 567mas needed  - Continue  Farxiga 80m24maily - Echo 02/20/19 LVEF 60-65% RV markedly dilated. Mod HK. RVSP 60.7 mmHg.  - Repeat echo at next visit.    2. RV failure & Pulmonary HTN - Has large left to right shunt through large anomalous pulmonary vein into SVC - [facility]. Continue medical therapy. - Echo 02/20/19 LVEF 60-65% RV markedly dilated. Mod HK. RVSP 60.7 mmHg. - Will refer to Pulmonary Rehab to increase stamina   3. CAD s/p CABG in 2015 - No s/s ischemia - Continue statin. Off ASA due to warfarin   4. AF s/p Maze  - s/p ablation 10/15  - remains in NSR today  - On warfarin. Has been on DOAC in past and didn't like it because of bleeding  5. CKD, stage 3-4, baseline creatinine 1.7-1.8 - Creatinine up to 2.49 on 06/23/19 - Most recently Scr 1.6 on 04/30/20 - Repeat labs today - Continue Farxiga 5    Signed, DanGlori BickersD  09/13/2020 12:25 PM  Advanced [facility] Failure CliPort [id number]art and [phone]) [phone])

## 2020-09-16 DIAGNOSIS — N184 Chronic kidney disease, stage 4 (severe): Secondary | ICD-10-CM | POA: Diagnosis not present

## 2020-09-17 NOTE — Patient Instructions (Signed)
Visit Information   PATIENT GOALS:   Goals Addressed             This Visit's Progress    Monitor and Manage My Blood Sugar-Diabetes Type 2       Timeframe:  Long-Range Goal Priority:  High Start Date:                             Expected End Date:                       Follow Up Date 12/10/2020    - check blood sugar at prescribed times - check blood sugar if I feel it is too high or too low - enter blood sugar readings and medication or insulin into daily log - take the blood sugar log to all doctor visits - take the blood sugar meter to all doctor visits    Why is this important?   Checking your blood sugar at home helps to keep it from getting very high or very low.  Writing the results in a diary or log helps the doctor know how to care for you.  Your blood sugar log should have the time, date and the results.  Also, write down the amount of insulin or other medicine that you take.  Other information, like what you ate, exercise done and how you were feeling, will also be helpful.     Notes:      Track and Manage My Blood Pressure-Hypertension       Timeframe:  Long-Range Goal Priority:  High Start Date:                             Expected End Date:                       Follow Up Date 12/10/2020    - check blood pressure daily - choose a place to take my blood pressure (home, clinic or office, retail store) - write blood pressure results in a log or diary    Why is this important?   You won't feel high blood pressure, but it can still hurt your blood vessels.  High blood pressure can cause heart or kidney problems. It can also cause a stroke.  Making lifestyle changes like losing a little weight or eating less salt will help.  Checking your blood pressure at home and at different times of the day can help to control blood pressure.  If the doctor prescribes medicine remember to take it the way the doctor ordered.  Call the office if you cannot afford the  medicine or if there are questions about it.     Notes:      Track and Manage Symptoms-Heart Failure       Timeframe:  Long-Range Goal Priority:  High Start Date:                             Expected End Date:                       Follow Up Date 12/10/2020    - begin a heart failure diary - bring diary to all appointments - develop a rescue plan - eat more whole grains, fruits and vegetables, lean meats and healthy  fats - follow rescue plan if symptoms flare-up - know when to call the doctor - track symptoms and what helps feel better or worse    Why is this important?   You will be able to handle your symptoms better if you keep track of them.  Making some simple changes to your lifestyle will help.  Eating healthy is one thing you can do to take good care of yourself.    Notes:         Consent to CCM Services: Mr. Altschuler was given information about Chronic Care Management services including:  CCM service includes personalized support from designated clinical staff supervised by his physician, including individualized plan of care and coordination with other care providers 24/7 contact phone numbers for assistance for urgent and routine care needs. Service will only be billed when office clinical staff spend 20 minutes or more in a month to coordinate care. Only one practitioner may furnish and bill the service in a calendar month. The patient may stop CCM services at any time (effective at the end of the month) by phone call to the office staff. The patient will be responsible for cost sharing (co-pay) of up to 20% of the service fee (after annual deductible is met).  Patient agreed to services and verbal consent obtained.   The patient verbalized understanding of instructions, educational materials, and care plan provided today and agreed to receive a mailed copy of patient instructions, educational materials, and care plan.   Telephone follow up appointment with care  management team member scheduled for: 12/10/2020  Signature: Fransico Michael, PharmD Clinical Pharmacist [email or web address] [phone]   CLINICAL CARE PLAN: Patient Care Plan: General Pharmacy (Adult)     Problem Identified: Hypertension, Hyperlipidemia, Diabetes, Atrial Fibrillation, Heart Failure, Coronary Artery Disease, GERD, Chronic Kidney Disease, Depression, Anxiety, and Gout      Long-Range Goal: Disease management   Start Date: 08/06/2020  Expected End Date: 08/06/2021  Priority: High  Note:    Current Barriers:  None identified  Pharmacist Clinical Goal(s):  Patient will achieve adherence to monitoring guidelines and medication adherence to achieve therapeutic efficacy through collaboration with PharmD and provider.   Interventions: 1:1 collaboration with [name], MD regarding development and update of comprehensive plan of care as evidenced by provider attestation and co-signature Inter-disciplinary care team collaboration (see longitudinal plan of care) Comprehensive medication review performed; medication list updated in electronic medical record  Hypertension  (Status:Goal on track: YES.)   Med Management Intervention: None  (BP goal <130/80) -Controlled -Current treatment: Carvedilol 6.25 mg 1/2 tablet QAM 1/2 PM (appropriate, effective, safe, accessible) Metolazone 2.5 mg daily for edema (CHF) (appropriate, effective, safe, accessible) Torsemide 100 mg 1.5 tablets daily (appropriate, effective, safe, accessible) -Medications previously tried: N/A  -Current home readings: checking daily through [facility]  (within normal limits) The patient  reports that he quit smoking about 39 years ago. His smoking use included cigarettes. He has a 100.00 pack-year smoking history 1 alcohol drink per week -Current dietary habits: Limits salt -Current exercise habits: Limited due to anemia -Denies hypotensive/hypertensive symptoms -Educated on BP goals and  benefits of medications for prevention of heart attack, stroke and kidney damage; Daily salt intake goal < 2300 mg; Exercise goal of 150 minutes per week; Importance of home blood pressure monitoring; Proper BP monitoring technique; Symptoms of hypotension and importance of maintaining adequate hydration; -Counseled to monitor BP at home daily, document, and provide log at future appointments -Counseled on diet and  exercise extensively Recommended to continue current medication  Hyperlipidemia: (LDL goal < 70)/ CAD -Controlled  LDLCALC 75 07/01/2020 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 Slightly elevated than goal, discussed dietary modifications with patient - No s/s ischemia On statin therapy, no ASA due to warfarin use -Current treatment: Atorvastatin 20 mg 1 tablet three times a week (appropriate, effective, safe, accessible) -Medications previously tried: N/A  -Current dietary patterns: Limits cholesterol -Current exercise habits: Limited due to anemia -Educated on Cholesterol goals;  Benefits of statin for ASCVD risk reduction; Importance of limiting foods high in cholesterol; Exercise goal of 150 minutes per week; -Counseled on diet and exercise extensively Recommended to continue current medication Recommended starting exercise as anemia resolves and keeping closer eye on dietary intake of cholesterol  Diabetes (A1c goal <7%) -Controlled -Current medications: Farxiga 5 mg daily (appropriate, effective, safe, query accessible) Insulin NPH-regular Human 70-30, inject 45 units into the skin daily with breakfast, 35 units in the evening (appropriate, effective, safe, accessible) -Medications previously tried: N/A  Retinopathy 09/30/2018 -Current home glucose readings Checks daily and they are transmitted to [facility] fasting glucose: 64-118, average mid 90's post prandial glucose: N/A -Reports hypoglycemic/hyperglycemic symptoms -Current meal patterns:  Limits carbs and sugars -Current  exercise: Limited due to anemia -Educated on A1c and blood sugar goals; Complications of diabetes including kidney damage, retinal damage, and cardiovascular disease; Exercise goal of 150 minutes per week; Benefits of weight loss; Proper insulin injection technique; Prevention and management of hypoglycemic episodes; Benefits of routine self-monitoring of blood sugar; Carbohydrate counting and/or plate method Counseled to check feet daily and get yearly eye exams -Counseled to check feet daily and get yearly eye exams -Counseled on diet and exercise extensively Recommended to continue current medication Counseled on benefits of exercise maintaining BG control Collaborated with ophthalmologist to obtain eye exam results for 2022 -CCM team to look into Farxiga PAP for patient  Atrial Fibrillation (Goal: prevent stroke and major bleeding) -Controlled Paroxysmal atrial fibrillation AFL s/p ablation in 10/15 05/30/2020: NSR Had bleeding due to DOAC, remains on warfarin -CHADSVASC: 5 -Current treatment: Rate control: Carvedilol 6.25 mg 1/2 tablet am 1/2 tablet pm (appropriate, effective, safe, accessible) Anticoagulation: Warfarin 5 mg UD (appropriate, effective, safe, accessible) PT / INR = 2.1 -Medications previously tried: N/A -Home BP and HR readings: checks daily and sends to [facility] for review, unable to recall at this time  -Counseled on increased risk of stroke due to Afib and benefits of anticoagulation for stroke prevention; importance of adherence to anticoagulant exactly as prescribed; bleeding risk associated with warfarin and importance of self-monitoring for signs/symptoms of bleeding; avoidance of NSAIDs due to increased bleeding risk with anticoagulants; importance of regular laboratory monitoring; seeking medical attention after a head injury or if there is blood in the urine/stool; -Recommended to continue current medication Educated on reducing risks of bleed, dietary  restrictions with warfarin, and monitoring for S/Sx of bleeds  Heart Failure (Goal: manage symptoms and prevent exacerbations) Echo 02/20/19 LVEF 60-65% RV markedly dilated Echo 12/18 LVEF 60% RV dilated mildly HK 07/03/2019: B Natriuretic Peptide 264.2 Volume status mildly elevated, Farxiga was added 05/30/2020 -Controlled -Last ejection fraction: 60-65% (Date: 02/20/2019) -HF type: Diastolic -NYHA Class: II (slight limitation of activity) - IIIb -AHA HF Stage: C (Heart disease and symptoms present) -Current treatment: Torsemide 100 1.5 tablets daily (appropriate, effective, safe, accessible) Metolazone 2.5 mg daily PRN (appropriate, effective, safe, accessible) Farxiga 5 mg daily (appropriate, effective, safe, accessible) Carvedilol 6.25 mg 1/2 qam and 1/2 qpm (appropriate,  effective, safe, accessible) -Medications previously tried: N/A  -Current home BP/HR readings: controlled -Current dietary habits: Balanced, limited sugar and salt -Current exercise habits: Limited due to anemia  -Educated on Benefits of medications for managing symptoms and prolonging life Importance of weighing daily; if you gain more than 3 pounds in one day or 5 pounds in one week, call CPP or PCP or cardiologist Proper diuretic administration and potassium supplementation Importance of blood pressure control -Counseled on diet and exercise extensively Recommended to continue current medication  Depression/Anxiety -Controlled -Current treatment: Sertraline 100 mg daily (appropriate, effective, safe, accessible) Alprazolam 1 mg 1/2 tablet HS PRN (appropriate, effective, safe, accessible) -Medications previously tried/failed: N/A -PHQ9: 04/29/2020: 0 -GAD7: N/A -Educated on Benefits of medication for symptom control -Recommended to continue current medication  GERD -Controlled -Current treatment  Gaviscon PRN (appropriate, effective, safe, accessible) Not currently taking pantoprazole and is  controlled -Medications previously tried: N/A  -Counseled on diet and exercise extensively Recommended to continue current medication  Chronic Kidney Disease Stage 3b  -All medications assessed for renal dosing and appropriateness in chronic kidney disease. -Counseled on avoiding salt and nephrotoxic medications (NSAIDs) and maintaining BP and BG control  Gout  -Controlled -Current treatment  Allopurinol 300 mg daily (appropriate, effective, safe, accessible) -Medications previously tried: N/A  Last gout attack: "quite a while ago" Counseled on avoiding shellfish and red meat Followed by rheumatologist -Counseled on diet and exercise extensively Recommended to continue current medication   Osteoarthritis  -Controlled -Current treatment  Hydrocodone/APAP 5-325 mg PRN (appropriate, effective, safe, accessible) -Medications previously tried: N/A  -Counseled on diet and exercise extensively Recommended to continue current medication  Patient Goals/Self-Care Activities Patient will:  - take medications as prescribed  Follow Up Plan: Telephone follow up appointment with care management team member scheduled for: 12/6

## 2020-09-18 DIAGNOSIS — D631 Anemia in chronic kidney disease: Secondary | ICD-10-CM | POA: Diagnosis not present

## 2020-09-18 DIAGNOSIS — N1832 Chronic kidney disease, stage 3b: Secondary | ICD-10-CM | POA: Diagnosis not present

## 2020-09-18 DIAGNOSIS — E876 Hypokalemia: Secondary | ICD-10-CM | POA: Diagnosis not present

## 2020-09-18 DIAGNOSIS — I5032 Chronic diastolic (congestive) heart failure: Secondary | ICD-10-CM | POA: Diagnosis not present

## 2020-09-18 DIAGNOSIS — N2581 Secondary hyperparathyroidism of renal origin: Secondary | ICD-10-CM | POA: Diagnosis not present

## 2020-09-18 DIAGNOSIS — Z23 Encounter for immunization: Secondary | ICD-10-CM | POA: Diagnosis not present

## 2020-09-18 DIAGNOSIS — I129 Hypertensive chronic kidney disease with stage 1 through stage 4 chronic kidney disease, or unspecified chronic kidney disease: Secondary | ICD-10-CM | POA: Diagnosis not present

## 2020-09-25 ENCOUNTER — Encounter (INDEPENDENT_AMBULATORY_CARE_PROVIDER_SITE_OTHER): Payer: Medicare Other | Admitting: Ophthalmology

## 2020-09-25 ENCOUNTER — Other Ambulatory Visit: Payer: Self-pay

## 2020-09-25 DIAGNOSIS — H35033 Hypertensive retinopathy, bilateral: Secondary | ICD-10-CM | POA: Diagnosis not present

## 2020-09-25 DIAGNOSIS — I10 Essential (primary) hypertension: Secondary | ICD-10-CM | POA: Diagnosis not present

## 2020-09-25 DIAGNOSIS — H353132 Nonexudative age-related macular degeneration, bilateral, intermediate dry stage: Secondary | ICD-10-CM

## 2020-09-25 DIAGNOSIS — H338 Other retinal detachments: Secondary | ICD-10-CM | POA: Diagnosis not present

## 2020-09-25 DIAGNOSIS — H43813 Vitreous degeneration, bilateral: Secondary | ICD-10-CM

## 2020-09-25 DIAGNOSIS — H34832 Tributary (branch) retinal vein occlusion, left eye, with macular edema: Secondary | ICD-10-CM

## 2020-09-25 LAB — HM DIABETES EYE EXAM

## 2020-10-01 ENCOUNTER — Other Ambulatory Visit: Payer: Self-pay

## 2020-10-01 DIAGNOSIS — D509 Iron deficiency anemia, unspecified: Secondary | ICD-10-CM

## 2020-10-02 ENCOUNTER — Inpatient Hospital Stay: Payer: Medicare Other

## 2020-10-02 ENCOUNTER — Other Ambulatory Visit: Payer: Self-pay

## 2020-10-02 ENCOUNTER — Inpatient Hospital Stay: Payer: Medicare Other | Admitting: Hematology

## 2020-10-02 ENCOUNTER — Inpatient Hospital Stay: Payer: Medicare Other | Attending: Hematology

## 2020-10-02 VITALS — BP 100/86 | HR 54 | Temp 97.8°F | Resp 17 | Wt 180.9 lb

## 2020-10-02 DIAGNOSIS — D509 Iron deficiency anemia, unspecified: Secondary | ICD-10-CM

## 2020-10-02 DIAGNOSIS — N183 Chronic kidney disease, stage 3 unspecified: Secondary | ICD-10-CM | POA: Diagnosis not present

## 2020-10-02 DIAGNOSIS — D631 Anemia in chronic kidney disease: Secondary | ICD-10-CM | POA: Diagnosis not present

## 2020-10-02 DIAGNOSIS — I5032 Chronic diastolic (congestive) heart failure: Secondary | ICD-10-CM

## 2020-10-02 LAB — CBC WITH DIFFERENTIAL (CANCER CENTER ONLY)
Abs Immature Granulocytes: 0.02 10*3/uL (ref 0.00–0.07)
Basophils Absolute: 0 10*3/uL (ref 0.0–0.1)
Basophils Relative: 1 %
Eosinophils Absolute: 0.1 10*3/uL (ref 0.0–0.5)
Eosinophils Relative: 1 %
HCT: 38.5 % — ABNORMAL LOW (ref 39.0–52.0)
Hemoglobin: 12.2 g/dL — ABNORMAL LOW (ref 13.0–17.0)
Immature Granulocytes: 0 %
Lymphocytes Relative: 11 %
Lymphs Abs: 0.8 10*3/uL (ref 0.7–4.0)
MCH: 29.2 pg (ref 26.0–34.0)
MCHC: 31.7 g/dL (ref 30.0–36.0)
MCV: 92.1 fL (ref 80.0–100.0)
Monocytes Absolute: 0.5 10*3/uL (ref 0.1–1.0)
Monocytes Relative: 8 %
Neutro Abs: 5.7 10*3/uL (ref 1.7–7.7)
Neutrophils Relative %: 79 %
Platelet Count: 160 10*3/uL (ref 150–400)
RBC: 4.18 MIL/uL — ABNORMAL LOW (ref 4.22–5.81)
RDW: 16.2 % — ABNORMAL HIGH (ref 11.5–15.5)
WBC Count: 7.2 10*3/uL (ref 4.0–10.5)
nRBC: 0 % (ref 0.0–0.2)

## 2020-10-02 LAB — CMP (CANCER CENTER ONLY)
ALT: 32 U/L (ref 0–44)
AST: 27 U/L (ref 15–41)
Albumin: 3.9 g/dL (ref 3.5–5.0)
Alkaline Phosphatase: 186 U/L — ABNORMAL HIGH (ref 38–126)
Anion gap: 11 (ref 5–15)
BUN: 31 mg/dL — ABNORMAL HIGH (ref 8–23)
CO2: 26 mmol/L (ref 22–32)
Calcium: 9.1 mg/dL (ref 8.9–10.3)
Chloride: 106 mmol/L (ref 98–111)
Creatinine: 1.63 mg/dL — ABNORMAL HIGH (ref 0.61–1.24)
GFR, Estimated: 42 mL/min — ABNORMAL LOW (ref >60)
Glucose, Bld: 131 mg/dL — ABNORMAL HIGH (ref 70–99)
Potassium: 4.2 mmol/L (ref 3.5–5.1)
Sodium: 143 mmol/L (ref 135–145)
Total Bilirubin: 0.6 mg/dL (ref 0.3–1.2)
Total Protein: 7 g/dL (ref 6.5–8.1)

## 2020-10-02 LAB — RETIC PANEL
Immature Retic Fract: 25.6 % — ABNORMAL HIGH (ref 2.3–15.9)
RBC.: 4.28 MIL/uL (ref 4.22–5.81)
Retic Count, Absolute: 65.1 10*3/uL (ref 19.0–186.0)
Retic Ct Pct: 1.5 % (ref 0.4–3.1)
Reticulocyte Hemoglobin: 31.5 pg (ref >27.9)

## 2020-10-02 LAB — IRON AND TIBC
Iron: 42 ug/dL (ref 42–163)
Saturation Ratios: 12 % — ABNORMAL LOW (ref 20–55)
TIBC: 341 ug/dL (ref 202–409)
UIBC: 299 ug/dL (ref 117–376)

## 2020-10-02 LAB — FERRITIN: Ferritin: 66 ng/mL (ref 24–336)

## 2020-10-02 MED ORDER — SODIUM CHLORIDE 0.9 % IV SOLN
750.0000 mg | Freq: Once | INTRAVENOUS | Status: AC
  Administered 2020-10-02: 750 mg via INTRAVENOUS
  Filled 2020-10-02: qty 15

## 2020-10-02 MED ORDER — LORATADINE 10 MG PO TABS
10.0000 mg | ORAL_TABLET | Freq: Every day | ORAL | Status: DC
  Administered 2020-10-02: 10 mg via ORAL
  Filled 2020-10-02: qty 1

## 2020-10-02 MED ORDER — FAMOTIDINE 20 MG PO TABS
20.0000 mg | ORAL_TABLET | Freq: Once | ORAL | Status: AC
  Administered 2020-10-02: 20 mg via ORAL
  Filled 2020-10-02: qty 1

## 2020-10-02 MED ORDER — ACETAMINOPHEN 325 MG PO TABS: 650.0000 mg | ORAL_TABLET | Freq: Once | ORAL | Status: DC

## 2020-10-02 MED ORDER — SODIUM CHLORIDE 0.9 % IV SOLN: INTRAVENOUS | Status: DC

## 2020-10-02 NOTE — Patient Instructions (Signed)

## 2020-10-03 ENCOUNTER — Other Ambulatory Visit: Payer: Self-pay

## 2020-10-03 ENCOUNTER — Ambulatory Visit (INDEPENDENT_AMBULATORY_CARE_PROVIDER_SITE_OTHER): Payer: Medicare Other

## 2020-10-03 VITALS — BP 148/82 | HR 74 | Temp 97.3°F | Wt 184.4 lb

## 2020-10-03 DIAGNOSIS — Z7901 Long term (current) use of anticoagulants: Secondary | ICD-10-CM

## 2020-10-03 LAB — PROTIME-INR
INR: 3 — ABNORMAL HIGH
Prothrombin Time: 28.2 s — ABNORMAL HIGH (ref 9.0–11.5)

## 2020-10-03 NOTE — Progress Notes (Signed)
Patient presented today for a NV to have a recheck of PT/INR. He is taking his warfarin 1.5 tabs everyday except Monday and Friday which he takes 2 tablets.  Patient had an iron infusion yesterday. And his Nephrologist said he didn't have to come back for a year.

## 2020-10-03 NOTE — Addendum Note (Signed)
Addended by: Chancy Hurter on: 10/03/2020 12:07 PM   Modules accepted: Orders

## 2020-10-03 NOTE — Progress Notes (Signed)
Please advise patient to take 1.5 tabs everyday but Friday take 2.

## 2020-10-08 ENCOUNTER — Encounter: Payer: Self-pay | Admitting: Hematology

## 2020-10-08 NOTE — Progress Notes (Signed)
HEMATOLOGY/ONCOLOGY CLINIC NOTE  Date of Service: .10/02/2020  Patient Care Team: [name], MD as PCP - General (Internal Medicine) Inda Castle, MD (Inactive) as Consulting Physician (Gastroenterology) Martinique, Peter M, MD as Consulting Physician (Cardiology) Rexene Alberts, MD (Inactive) as Consulting Physician (Cardiothoracic Surgery) Szott, Prescott Gum, DDS as Referring Physician Hayden Pedro, MD as Consulting Physician (Ophthalmology) Lavonna Monarch, MD as Consulting Physician (Dermatology) Josue Hector, MD as Consulting Physician (Cardiology) Thompson Grayer, MD as Consulting Physician (Cardiology) Rosita Fire, MD as Consulting Physician (Nephrology) Bensimhon, Shaune Pascal, MD as Consulting Physician (Cardiology) Rush Landmark, [facility] as Pharmacist (Pharmacist)  CHIEF COMPLAINT:Iron deficiency anemia  INTERVAL HISTORY:  [name]. Returns today for a follow up for chronic anemia. The patient's last visit with [place] was on 07/02/2020. Patient was admitted from 06/09/2020-06/13/2020 for GI bleed. Patient underwent EGD that revealed gastric AVMs that were treated with APC.  He also had supratherapeutic INR at the time.  Patient notes he has had no visible black stools or blood in the stool since his last visit.  He is remained off aspirin but continues to be on Coumadin.  Has tolerated IV iron without any acute issues thus far.  Labs done today reviewed with the patient.  MEDICAL HISTORY:  Past Medical History:  Diagnosis Date   Adrenal adenoma    Arthritis    Atypical atrial flutter ([place]) 8/15, 10/15   a. DCCV 08/2013. b. s/p RFA 10/2013.   Basal cell carcinoma    CAD (coronary artery disease)    a. 04/2013 CABG x 2: LIMA to LAD, SVG to RI, EVH via R thigh.   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 s/p MAZE 04/2013 in setting of CABG. b. Amio stopped in 10/2013 after flutter ablation.   PFO (patent foramen ovale)    a. Small PFO by TEE 10/2013.   Pleural effusion, left    a. s/p thoracentesis 05/2013.   Respiratory failure ([place])    a. Hypoxia 10/2013 - required supp O2 as inpatient, did not require it at discharge.   S/P Maze operation for atrial fibrillation    a. 04/2013: Complete bilateral atrial lesion set using cryothermy and bipolar radiofrequency ablation with clipping of LA appendage (@ time of CABG)    SURGICAL HISTORY: Past Surgical History:  Procedure Laterality Date   APPENDECTOMY  03/05/2017   laproscopic   ATRIAL FIBRILLATION ABLATION N/A 10/26/2013   Procedure: ATRIAL FIBRILLATION ABLATION;  Surgeon: Coralyn Mark, MD;  Location: [place] CATH LAB;  Service: Cardiovascular;  Laterality: N/A;   BASAL CELL CARCINOMA EXCISION     x3 on face   BIOPSY  06/12/2020   Procedure:  BIOPSY;  Surgeon: Jerene Bears, MD;  Location: [facility] ENDOSCOPY;  Service: Gastroenterology;;   CARDIAC CATHETERIZATION     myocardial bridge but no cad   CARDIOVERSION N/A 08/23/2013   Procedure: CARDIOVERSION;  Surgeon: Sanda Klein, MD;  Location: [place];  Service: Cardiovascular;  Laterality: N/A;   CARPOMETACARPEL SUSPENSION PLASTY Left 02/14/2014   Procedure: CARPOMETACARPEL ([place]) SUSPENSIONPLASTY THUMB  WITH  ABDUCTOR POLLICIS LONGUS TRANSFER AND STENOSING  TENOSYNOVITIS RELEASE LEFT WRIST;  Surgeon: Charlotte Crumb, MD;  Location: [place];  Service: Orthopedics;  Laterality: Left;   CATARACT EXTRACTION Bilateral    COLONOSCOPY WITH PROPOFOL N/A 01/26/2017   Procedure: COLONOSCOPY WITH PROPOFOL;  Surgeon: Doran Stabler, MD;  Location: [facility];  Service: Gastroenterology;  Laterality: N/A;   CORONARY ARTERY BYPASS GRAFT N/A 04/05/2013   Procedure: CORONARY ARTERY BYPASS GRAFTING (CABG) TIMES TWO USING LEFT INTERNAL MAMMARY ARTERY AND RIGHT SAPHENOUS LEG VEIN HARVESTED ENDOSCOPICALLY;  Surgeon: Rexene Alberts, MD;  Location: [place];  Service: Open Heart Surgery;  Laterality: N/A;   ENTEROSCOPY N/A 06/12/2020   Procedure: ENTEROSCOPY;  Surgeon: Jerene Bears, MD;  Location: [facility];  Service: Gastroenterology;  Laterality: N/A;   ESOPHAGOGASTRODUODENOSCOPY (EGD) WITH PROPOFOL N/A 01/26/2017   Procedure: ESOPHAGOGASTRODUODENOSCOPY (EGD) WITH PROPOFOL;  Surgeon: Doran Stabler, MD;  Location: [facility];  Service: Gastroenterology;  Laterality: N/A;   EYE SURGERY Bilateral    Cat Sx   EYE SURGERY Right    RD repair - SB   GREAT TOE ARTHRODESIS, INTERPHALANGEAL JOINT     Right foot   HOT HEMOSTASIS N/A 06/12/2020   Procedure: HOT HEMOSTASIS (ARGON PLASMA COAGULATION/BICAP);  Surgeon: Jerene Bears, MD;  Location: [facility];  Service: Gastroenterology;  Laterality: N/A;   INTRAOPERATIVE TRANSESOPHAGEAL ECHOCARDIOGRAM N/A 04/05/2013   Procedure: INTRAOPERATIVE TRANSESOPHAGEAL ECHOCARDIOGRAM;  Surgeon: Rexene Alberts, MD;  Location: [place];  Service: Open Heart Surgery;  Laterality: N/A;   LAPAROSCOPIC APPENDECTOMY N/A 03/05/2017   Procedure: APPENDECTOMY LAPAROSCOPIC;  Surgeon: Judeth Horn, MD;  Location: [place];  Service: General;  Laterality: N/A;   LEFT HEART CATHETERIZATION WITH CORONARY ANGIOGRAM N/A 03/07/2013   Procedure: LEFT HEART CATHETERIZATION WITH CORONARY ANGIOGRAM;  Surgeon: Burnell Blanks, MD;   Location: [facility] CATH LAB;  Service: Cardiovascular;  Laterality: N/A;   MAZE N/A 04/05/2013   Procedure: MAZE;  Surgeon: Rexene Alberts, MD;  Location: [place];  Service: Open Heart Surgery;  Laterality: N/A;   Polinydal cyst     Removed   POLYPECTOMY     Retina repair-right     RETINAL DETACHMENT SURGERY Right    RD Repair - SB   RIGHT HEART CATH N/A 07/06/2019   Procedure: RIGHT HEART CATH;  Surgeon: Jolaine Artist, MD;  Location: [place] CV LAB;  Service: Cardiovascular;  Laterality: N/A;   RIGHT HEART CATHETERIZATION N/A 05/03/2014   Procedure: RIGHT HEART CATH;  Surgeon: Jolaine Artist, MD;  Location: [facility] CATH LAB;  Service: Cardiovascular;  Laterality: N/A;   TEE WITHOUT CARDIOVERSION N/A 08/23/2013   Procedure: TRANSESOPHAGEAL ECHOCARDIOGRAM (TEE);  Surgeon: Sanda Klein, MD;  Location: [facility] ENDOSCOPY;  Service: Cardiovascular;  Laterality: N/A;   TEE WITHOUT CARDIOVERSION N/A 10/26/2013   Procedure: TRANSESOPHAGEAL ECHOCARDIOGRAM (TEE);  Surgeon: Sueanne Margarita, MD;  Location: [facility] ENDOSCOPY;  Service: Cardiovascular;  Laterality: N/A;   TEE WITHOUT CARDIOVERSION N/A 06/05/2014   Procedure: TRANSESOPHAGEAL ECHOCARDIOGRAM (TEE);  Surgeon: Thayer Headings, MD;  Location: [place];  Service: Cardiovascular;  Laterality: N/A;   TRAPEZIUM RESECTION      SOCIAL HISTORY: Social History   Socioeconomic History   Marital status: Married    Spouse name: Not on file   Number of children: 1   Years of education: Not on file   Highest education level: Not on file  Occupational History   Occupation: retired Software engineer  Tobacco Use   Smoking status: Former    Packs/day: 4.00    Years: 25.00    Pack years: 100.00    Types: Cigarettes    Quit date: 01/05/1981    Years since quitting: 39.7   Smokeless tobacco: Never  Vaping Use   Vaping Use: Never used  Substance and Sexual Activity   Alcohol use: Yes    Alcohol/week: 1.0 standard drink    Types: 1 Shots of liquor per week     Comment: 1-5 drinks per week   Drug use: No   Sexual activity: Yes  Other Topics Concern   Not on file  Social History Narrative   Daily caffeine-yes   Patient gets regular exercise.   Pt lives in [place] with spouse.  Retired Software engineer.  Social Determinants of Health   Financial Resource Strain: Not on file  Food Insecurity: Not on file  Transportation Needs: No Transportation Needs   Lack of Transportation (Medical): No   Lack of Transportation (Non-Medical): No  Physical Activity: Not on file  Stress: Not on file  Social Connections: Not on file  Intimate Partner Violence: Not on file    FAMILY HISTORY: Family History  Problem Relation Age of Onset   Dementia Father    Colon cancer Mother        Family History/Uncle    Colon polyps Mother        Family History   Atrial fibrillation Mother     Hypertension Mother    Colon polyps Sister        Family history   Diabetes Maternal Uncle    Stroke Paternal Uncle     ALLERGIES:  is allergic to sunflower oil, horse-derived products, other, immune globulin, tetanus toxoids, and tetanus toxoid.  MEDICATIONS:  Current Outpatient Medications  Medication Sig Dispense Refill   ACCU-CHEK AVIVA PLUS test strip CHECK BLOOD GLUCOSE 3 TIMES DAILY 300 strip 3   acetaminophen (TYLENOL) 500 MG tablet Take 1,000 mg by mouth every 6 (six) hours as needed for moderate pain or headache.      allopurinol (ZYLOPRIM) 300 MG tablet Take 300 mg by mouth daily.     ALPRAZolam (XANAX) 1 MG tablet Take 0.5 tablets (0.5 mg total) by mouth at bedtime as needed for sleep. 60 tablet 0   Alum Hydroxide-Mag Carbonate (GAVISCON PO) Take 4 tablets by mouth daily as needed (acid reflux).      Ascorbic Acid (VITAMIN C) 1000 MG tablet Take 1,000 mg by mouth daily.     atorvastatin (LIPITOR) 20 MG tablet Take  1 tablet  3 x /week  for Cholesterol 39 tablet 3   azelastine (ASTELIN) 0.1 % nasal spray Place 2 sprays into both nostrils 2 (two) times daily as needed for rhinitis or allergies. 30 mL 3   B Complex Vitamins (VITAMIN B COMPLEX PO) Take 1 tablet by mouth at bedtime.      bacitracin 500 UNIT/GM ointment Apply 1 application topically daily as needed for wound care.     Blood Glucose Monitoring Suppl (ACCU-CHEK AVIVA PLUS) w/Device KIT Check blood sugar 1 time  daily 1 kit 0   Blood Glucose Monitoring Suppl DEVI Test blood sugar up to three times a day or as directed. 1 each 0   carvedilol (COREG) 6.25 MG tablet TAKE ONE-HALF TABLET BY  MOUTH IN THE MORNING AND 1  TABLET BY MOUTH IN THE  EVENING 135 tablet 3   Cholecalciferol (VITAMIN D PO) Take 5,000 Units by mouth daily.      colchicine 0.6 MG tablet Take 0.6 mg by mouth every other day.     Continuous Blood Gluc Receiver (DEXCOM G6 RECEIVER) DEVI Use to check blood sugar multiple times a day for Hypoglycemia and  Hyperglycemia 1 each 0   Continuous Blood Gluc Sensor (DEXCOM G6 SENSOR) MISC Use to check blood sugar multiple times a day for Hypoglycemia and Hyperglycemia 1 each 0   Continuous Blood Gluc Transmit (DEXCOM G6 TRANSMITTER) MISC Use to check blood sugar multiple times a day for Hypoglycemia and Hyperglycemia 1 each 0   dapagliflozin propanediol (FARXIGA) 5 MG TABS tablet Take 1 tablet (5 mg total) by mouth daily before breakfast. 90 tablet 3   diphenhydrAMINE (BENADRYL) 25 MG tablet Take 25 mg by mouth as needed.  fluticasone (FLONASE) 50 MCG/ACT nasal spray Place 1 spray into both nostrils daily as needed for allergies. 16 g 3   HYDROcodone-acetaminophen (NORCO/VICODIN) 5-325 MG tablet 1 tablet as needed     insulin NPH-regular Human (70-30) 100 UNIT/ML injection Inject 45 Units into the skin daily with breakfast. 25units in the evening     Iron-FA-B Cmp-C-Biot-Probiotic (FUSION PLUS) CAPS Take 1 capsule by mouth daily. (Patient not taking: Reported on 09/13/2020) 90 capsule 2   loratadine (CLARITIN) 10 MG tablet Take 10 mg by mouth daily as needed for allergies.      metolazone (ZAROXOLYN) 2.5 MG tablet Take 2.5 mg by mouth daily. Only as needed for CHF related weight gain     metolazone (ZAROXOLYN) 5 MG tablet 1 tablet as needed     Multiple Vitamins-Minerals (PRESERVISION AREDS 2) CAPS Take 1 capsule by mouth 2 (two) times daily.      polyvinyl alcohol (LIQUIFILM TEARS) 1.4 % ophthalmic solution Place 1 drop into both eyes daily as needed for dry eyes.     potassium chloride SA (KLOR-CON) 20 MEQ tablet TAKE 2 TABLETS BY MOUTH 3  TIMES DAILY FOR POTASSIUM 540 tablet 3   sertraline (ZOLOFT) 100 MG tablet TAKE 1 TABLET BY MOUTH  DAILY FOR MOOD 90 tablet 3   torsemide (DEMADEX) 100 MG tablet Take 150 mg by mouth daily. 1.5 tablet     trimethoprim-polymyxin b (POLYTRIM) ophthalmic solution Only when he has eye injections     warfarin (COUMADIN) 10 MG tablet Take 10 mg by mouth 2 (two) times a  week.     warfarin (COUMADIN) 5 MG tablet Take    1.5 tablets /day      or as directed (Patient taking differently: Take 1.5 tablets by mouth x 5 days weekly) 135 tablet 3   zinc gluconate 50 MG tablet Take 50 mg by mouth daily.     No current facility-administered medications for this visit.    REVIEW OF SYSTEMS:   .10 Point review of Systems was done is negative except as noted above.   PHYSICAL EXAMINATION: ECOG PERFORMANCE STATUS: 2 - Symptomatic, <50% confined to bed  . Vitals:   10/02/20 1044  BP: 100/86  Pulse: (!) 54  Resp: 17  Temp: 97.8 F (36.6 C)  SpO2: 98%   Filed Weights   10/02/20 1044  Weight: 180 lb 14.4 oz (82.1 kg)   .Body mass index is 25.23 kg/m. Exam was given in a chair.  [name] GENERAL:alert, in no acute distress and comfortable SKIN: no acute rashes, no significant lesions EYES: conjunctiva are pink and non-injected, sclera anicteric OROPHARYNX: MMM, no exudates, no oropharyngeal erythema or ulceration NECK: supple, no JVD LYMPH:  no palpable lymphadenopathy in the cervical, axillary or inguinal regions LUNGS: clear to auscultation b/l with normal respiratory effort HEART: regular rate & rhythm ABDOMEN:  normoactive bowel sounds , non tender, not distended. Extremity: no pedal edema PSYCH: alert & oriented x 3 with fluent speech NEURO: no focal motor/sensory deficits    LABORATORY DATA:  I have reviewed the data as listed  . CBC Latest Ref Rng & Units 10/02/2020 08/06/2020 07/01/2020  WBC 4.0 - 10.5 K/uL 7.2 9.1 8.9  Hemoglobin 13.0 - 17.0 g/dL 12.2(L) 11.9(L) 9.7(L)  Hematocrit 39.0 - 52.0 % 38.5(L) 37.8(L) 30.6(L)  Platelets 150 - 400 K/uL 160 313 246   . CBC    Component Value Date/Time   WBC 7.2 10/02/2020 1005   WBC 9.1 08/06/2020 1553  RBC 4.18 (L) 10/02/2020 1005   RBC 4.28 10/02/2020 1004   HGB 12.2 (L) 10/02/2020 1005   HCT 38.5 (L) 10/02/2020 1005   HCT 30.3 (L) 07/05/2019 1416   PLT 160 10/02/2020 1005   MCV 92.1  10/02/2020 1005   MCH 29.2 10/02/2020 1005   MCHC 31.7 10/02/2020 1005   RDW 16.2 (H) 10/02/2020 1005   LYMPHSABS 0.8 10/02/2020 1005   MONOABS 0.5 10/02/2020 1005   EOSABS 0.1 10/02/2020 1005   BASOSABS 0.0 10/02/2020 1005     . CMP Latest Ref Rng & Units 10/02/2020 09/13/2020 08/06/2020  Glucose 70 - 99 mg/dL 131(H) 193(H) 211(H)  BUN 8 - 23 mg/dL 31(H) 29(H) 27(H)  Creatinine 0.61 - 1.24 mg/dL 1.63(H) 1.73(H) 1.83(H)  Sodium 135 - 145 mmol/L 143 140 138  Potassium 3.5 - 5.1 mmol/L 4.2 5.1 4.5  Chloride 98 - 111 mmol/L 106 106 102  CO2 22 - 32 mmol/L _0 Calcium 8.9 - 10.3 mg/dL 9.1 8.9 8.8  Total Protein 6.5 - 8.1 g/dL 7.0 - 6.8  Total Bilirubin 0.3 - 1.2 mg/dL 0.6 - 0.6  Alkaline Phos 38 - 126 U/L 186(H) - -  AST 15 - 41 U/L 27 - 23  ALT 0 - 44 U/L 32 - 24   . Lab Results  Component Value Date   IRON 42 10/02/2020   TIBC 341 10/02/2020   IRONPCTSAT 12 (L) 10/02/2020   (Iron and TIBC)  Lab Results  Component Value Date   FERRITIN 66 10/02/2020     RADIOGRAPHIC STUDIES: No images were reviewed during this visit.   ASSESSMENT AND PLAN: 1) Normocytic Anemia 2/2 Iron deficiency anemia and CKD : -Recently admitted from 06/06/2020-06/13/2020 due to GI bleed. 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  PLAN -Currently on warfarin but discontinued ASA.  -CBC shows hemoglobin of 12.2 with a normal WBC count and normal platelets -Ferritin level is 66 with iron saturation of 12% -Patient will proceed with IV injectafer today. -Patient will return to the clinic in 3 months with repeat labs and tentative slot for IV injectafer.   Follow-up Please schedule for labs and IV Injectafer in 3 months Please schedule for labs IV Injectafer and MD visit in 6 months  . The total time spent in the appointment was 20 minutes and more than 50% was on counseling and direct patient cares.  Brunetta Genera MD

## 2020-10-10 ENCOUNTER — Encounter: Payer: Self-pay | Admitting: Internal Medicine

## 2020-10-14 ENCOUNTER — Ambulatory Visit: Payer: Medicare Other | Admitting: Adult Health

## 2020-10-15 ENCOUNTER — Ambulatory Visit: Payer: Medicare Other | Admitting: Adult Health

## 2020-10-23 ENCOUNTER — Other Ambulatory Visit: Payer: Self-pay

## 2020-10-23 ENCOUNTER — Telehealth: Payer: Self-pay | Admitting: Hematology

## 2020-10-23 ENCOUNTER — Ambulatory Visit (INDEPENDENT_AMBULATORY_CARE_PROVIDER_SITE_OTHER): Payer: Medicare Other | Admitting: Internal Medicine

## 2020-10-23 ENCOUNTER — Encounter: Payer: Self-pay | Admitting: Internal Medicine

## 2020-10-23 VITALS — BP 114/70 | HR 63 | Temp 97.8°F | Resp 16 | Ht 71.0 in | Wt 182.4 lb

## 2020-10-23 DIAGNOSIS — Z7901 Long term (current) use of anticoagulants: Secondary | ICD-10-CM

## 2020-10-23 DIAGNOSIS — I482 Chronic atrial fibrillation, unspecified: Secondary | ICD-10-CM | POA: Diagnosis not present

## 2020-10-23 DIAGNOSIS — E1169 Type 2 diabetes mellitus with other specified complication: Secondary | ICD-10-CM

## 2020-10-23 DIAGNOSIS — E1122 Type 2 diabetes mellitus with diabetic chronic kidney disease: Secondary | ICD-10-CM

## 2020-10-23 DIAGNOSIS — E559 Vitamin D deficiency, unspecified: Secondary | ICD-10-CM

## 2020-10-23 DIAGNOSIS — Z0000 Encounter for general adult medical examination without abnormal findings: Secondary | ICD-10-CM

## 2020-10-23 DIAGNOSIS — Z0001 Encounter for general adult medical examination with abnormal findings: Secondary | ICD-10-CM

## 2020-10-23 DIAGNOSIS — Z794 Long term (current) use of insulin: Secondary | ICD-10-CM

## 2020-10-23 DIAGNOSIS — N2581 Secondary hyperparathyroidism of renal origin: Secondary | ICD-10-CM

## 2020-10-23 DIAGNOSIS — Z79899 Other long term (current) drug therapy: Secondary | ICD-10-CM

## 2020-10-23 DIAGNOSIS — N183 Chronic kidney disease, stage 3 unspecified: Secondary | ICD-10-CM

## 2020-10-23 DIAGNOSIS — I2510 Atherosclerotic heart disease of native coronary artery without angina pectoris: Secondary | ICD-10-CM | POA: Diagnosis not present

## 2020-10-23 DIAGNOSIS — Z87891 Personal history of nicotine dependence: Secondary | ICD-10-CM

## 2020-10-23 DIAGNOSIS — I10 Essential (primary) hypertension: Secondary | ICD-10-CM

## 2020-10-23 DIAGNOSIS — Z1212 Encounter for screening for malignant neoplasm of rectum: Secondary | ICD-10-CM

## 2020-10-23 DIAGNOSIS — I272 Pulmonary hypertension, unspecified: Secondary | ICD-10-CM

## 2020-10-23 DIAGNOSIS — Z8249 Family history of ischemic heart disease and other diseases of the circulatory system: Secondary | ICD-10-CM

## 2020-10-23 DIAGNOSIS — Z136 Encounter for screening for cardiovascular disorders: Secondary | ICD-10-CM

## 2020-10-23 DIAGNOSIS — N138 Other obstructive and reflux uropathy: Secondary | ICD-10-CM

## 2020-10-23 DIAGNOSIS — Z1211 Encounter for screening for malignant neoplasm of colon: Secondary | ICD-10-CM

## 2020-10-23 DIAGNOSIS — G4733 Obstructive sleep apnea (adult) (pediatric): Secondary | ICD-10-CM

## 2020-10-23 DIAGNOSIS — Z125 Encounter for screening for malignant neoplasm of prostate: Secondary | ICD-10-CM

## 2020-10-23 DIAGNOSIS — N1832 Chronic kidney disease, stage 3b: Secondary | ICD-10-CM | POA: Diagnosis not present

## 2020-10-23 DIAGNOSIS — E0822 Diabetes mellitus due to underlying condition with diabetic chronic kidney disease: Secondary | ICD-10-CM

## 2020-10-23 DIAGNOSIS — I5032 Chronic diastolic (congestive) heart failure: Secondary | ICD-10-CM

## 2020-10-23 NOTE — Telephone Encounter (Signed)
Sch per 9/29 los, pt aware 

## 2020-10-23 NOTE — Progress Notes (Signed)
Annual  Screening/Preventative Visit  & Comprehensive Evaluation & Examination  Future Appointments  Date Time Provider [place]  10/23/2020 11:00 AM [name], MD [facility] None  11/06/2020 12:30 PM Hayden Pedro, MD [facility] None  11/14/2020  3:30 PM [name], MD [facility] None  12/10/2020  3:00 PM Rush Landmark, [place] [facility] None  02/26/2021  3:30 PM Liane Comber, NP [facility] None  04/02/2021 10:40 AM Brunetta Genera, MD [facility] None  10/23/2021 11:00 AM [name], MD [facility] None            This very nice 80 y.o. MWM presents for a Screening /Preventative Visit & comprehensive evaluation and management of multiple medical co-morbidities.  Patient has been followed for HTN, HLD, T2_NIDDM  and Vitamin D Deficiency. Patient is also followed by Dr Irene Limbo for chronic Iron Deficiency for periodic iron infusions.  Patient is on CPAP for OSA.       HTN predates circa 1980. Patient's BP has been controlled at home.  In 2018, patient underwent CABG & Maze procedure. He's had RFA for chronic Afib (2003).  He is on Warfarin longstanding. Today's BP is at goal -114/70. Patient denies any cardiac symptoms as chest pain, palpitations, shortness of breath, dizziness or ankle swelling. He has COPD, Restrictive Lung Dz, Pulm HTN and is followed in the [facility] by Dr [name].        Patient's hyperlipidemia is controlled with diet and Atorvastatin. Patient denies myalgias or other medication SE's. 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  Patient is also followed by Dr  Lawson Radar (Nephrology)  and last PTH elevated at 120  (Nl 15-65) consistent with Secondary Hyperparathyroidism of Renal Insufficiency  on 09/16/20. Patient is on Novolin 70/30 bid. & A1c was not at goal:   Lab Results  Component Value Date   HGBA1C 7.0 (H) 06/10/2020          Finally, patient has history of Vitamin D Deficiency ("39" /2008) and last Vitamin D was still sl low (goal 70-100):   Lab Results  Component Value Date   VD25OH 49 07/01/2020     Current Outpatient Medications on File Prior to Visit  Medication Sig   acetaminophen 500 MG tablet Take 1,000 mg every 6 (six) hours as needed    allopurinol  300 MG tablet Take daily.   ALPRAZolam 1 MG tablet Take 0.5 tablets  at bedtime as needed for sleep.   GAVISCON  Take 4 tablets daily as needed (acid reflux).    VITAMIN C 1000 MG tablet Take  daily.   atorvastatin 20 MG tablet Take  1 tablet  3 x /week  for Cholesterol   aASTELIN 0.1 % nasal spray Place 2 sprays into both nostrils 2  x /daily as needed    ITAMIN B COMPLEX  Take 1 tablet  at bedtime.    bacitracin 500 UNIT/GM ointment Apply  daily as needed for wound care.   carvedilol (COREG) 6.25 MG tablet TAKE 1/2 tab bid (q12 hr)    VITAMIN D 5,000 Units  Take daily.    colchicine 0.6 MG tablet Take every other day.   Wilder Glade  5 MG  Take 1 tablet daily    diphenhydrAMINE  25 MG tablet Take as needed.   FLONASE  nasal spray Place 1 spray into both nostrils daily as needed    NORCO 5-325 MG tablet 1 tablet as needed   Novolin 70-30  inj Inject 45 Units  breakfast. 25units in  evening   loratadine 10 MG tablet Take  daily as needed for allergies.    metolazone  2.5 MG tablet Take 2.5 mg daily - infrequently   PRESERVISION AREDS 2 Take 1 capsule  2  times daily.    LIQUIFILM TEARS ophth soln Place 1 drop into both eyes daily as needed   potassium chloride 20 MEQ tablet TAKE 2 TABLETS  3  TIMES DAILY    sertraline 100 MG tablet TAKE 1 TABLET  DAILY    torsemide  100 MG tablet Take 150 mg  daily. 1.5 tablet   warfarin 5 MG tablet Take    1.5 tablets /day      or as directed (Patient taking differently:  Take 1.5 tablets by mouth x 5 days weekly)   zinc  50 MG tablet Take  daily.    Allergies  Allergen Reactions   Sunflower Oil Swelling and Other (See Comments)    Tongue and lip swelling Other reaction(s): Other Tongue and lip swelling   Horse-Derived Products Other (See Comments)    Per allergy skin test UNSPECIFIED REACTION  Other reaction(s): Other Per allergy skin test UNSPECIFIED REACTION    Other Other (See Comments)    Tetanus Shot -- skin test reaction   Immune Globulin    Tetanus Toxoids Other (See Comments)    Per allergy skin test   Tetanus Toxoid     Other reaction(s): Other (See Comments) Rash(horse serum) Other reaction(s): Other Rash(horse serum)     Past Medical History:  Diagnosis Date   Adrenal adenoma    Arthritis    Atypical atrial flutter ([place]) 8/15, 10/15   a. DCCV 08/2013. b. s/p RFA 10/2013.   Basal cell carcinoma    CAD (coronary artery disease)    a. 04/2013 CABG x 2: LIMA to LAD, SVG to RI, EVH via R thigh.   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 s/p MAZE 04/2013 in setting of CABG. b. Amio stopped in 10/2013 after flutter ablation.   PFO (patent foramen ovale)    a. Small PFO by TEE 10/2013.   Pleural effusion, left    a. s/p thoracentesis 05/2013.   Respiratory  failure ([place])    a. Hypoxia 10/2013 - required supp O2 as inpatient, did not require it at discharge.   S/P Maze operation for atrial fibrillation  a. 04/2013: Complete bilateral atrial lesion set using cryothermy and bipolar radiofrequency ablation with clipping of LA appendage (@ time of CABG)     Health Maintenance  Topic Date Due   Zoster Vaccines- Shingrix (1 of 2) Never done   COVID-19 Vaccine (3 - Pfizer risk series) 03/15/2019   INFLUENZA VACCINE  08/05/2020   FOOT EXAM  10/09/2020   URINE MICROALBUMIN  10/10/2020   HEMOGLOBIN A1C  12/10/2020   OPHTHALMOLOGY EXAM  09/25/2021   TETANUS/TDAP  07/05/2025   HPV VACCINES  Aged Out     Immunization History  Administered Date(s) Administered   DT (Pediatric) 08/05/2015   Influenza Split 10/25/2012   Influenza, High Dose Seasonal PF 09/26/2013, 09/27/2014, 10/03/2015, 10/05/2016, 11/11/2017, 10/10/2018, 10/11/2019   Influenza-Unspecified 10/05/2016, 09/22/2017   PFIZER(Purple Top)SARS-COV-2 Vaccination 01/26/2019, 02/15/2019   Pneumococcal Conjugate-13 01/30/2014   Pneumococcal Polysaccharide-23 10/20/2011   Td 01/06/2000    Last Colon - 01/26/2017 - Dr [name] - appendicocele Polyp.  Past Surgical History:  Procedure Laterality Date   APPENDECTOMY  03/05/2017   laproscopic   ATRIAL FIBRILLATION ABLATION N/A 10/26/2013   Procedure: ATRIAL FIBRILLATION ABLATION;  Surgeon: Coralyn Mark, MD;  Location: [facility] CATH LAB;  Service: Cardiovascular;  Laterality: N/A;   BASAL CELL CARCINOMA EXCISION     x3 on face   BIOPSY  06/12/2020   Procedure: BIOPSY;  Surgeon: Jerene Bears, MD;  Location: [facility];  Service: Gastroenterology;;   CARDIAC CATHETERIZATION     myocardial bridge but no cad   CARDIOVERSION N/A 08/23/2013   Procedure: CARDIOVERSION;  Surgeon: Sanda Klein, MD;  Location: [facility];  Service: Cardiovascular;  Laterality: N/A;   CARPOMETACARPEL SUSPENSION PLASTY Left 02/14/2014   Procedure: CARPOMETACARPEL  ([place]) SUSPENSIONPLASTY THUMB  WITH  ABDUCTOR POLLICIS LONGUS TRANSFER AND STENOSING TENOSYNOVITIS RELEASE LEFT WRIST;  Surgeon: Charlotte Crumb, MD;  Location: [facility];  Service: Orthopedics;  Laterality: Left;   CATARACT EXTRACTION Bilateral    COLONOSCOPY WITH PROPOFOL N/A 01/26/2017   Procedure: COLONOSCOPY WITH PROPOFOL;  Surgeon: Doran Stabler, MD;  Location: [facility];  Service: Gastroenterology;  Laterality: N/A;   CORONARY ARTERY BYPASS GRAFT N/A 04/05/2013   Procedure: CORONARY ARTERY BYPASS GRAFTING (CABG) TIMES TWO USING LEFT INTERNAL MAMMARY ARTERY AND RIGHT SAPHENOUS LEG VEIN HARVESTED ENDOSCOPICALLY;  Surgeon: Rexene Alberts, MD;  Location: [facility];  Service: Open Heart Surgery;  Laterality: N/A;   ENTEROSCOPY N/A 06/12/2020   Procedure: ENTEROSCOPY;  Surgeon: Jerene Bears, MD;  Location: [facility];  Service: Gastroenterology;  Laterality: N/A;   ESOPHAGOGASTRODUODENOSCOPY (EGD) WITH PROPOFOL N/A 01/26/2017   Procedure: ESOPHAGOGASTRODUODENOSCOPY (EGD) WITH PROPOFOL;  Surgeon: Doran Stabler, MD;  Location: [facility];  Service: Gastroenterology;  Laterality: N/A;   EYE SURGERY Bilateral    Cat Sx   EYE SURGERY Right    RD repair - SB   GREAT TOE ARTHRODESIS, INTERPHALANGEAL JOINT     Right foot   HOT HEMOSTASIS N/A 06/12/2020   Procedure: HOT HEMOSTASIS (ARGON PLASMA COAGULATION/BICAP);  Surgeon: Jerene Bears, MD;  Location: [facility];  Service: Gastroenterology;  Laterality: N/A;   INTRAOPERATIVE TRANSESOPHAGEAL ECHOCARDIOGRAM N/A 04/05/2013   Procedure: INTRAOPERATIVE TRANSESOPHAGEAL ECHOCARDIOGRAM;  Surgeon: Rexene Alberts, MD;  Location: [facility];  Service: Open Heart Surgery;  Laterality: N/A;   LAPAROSCOPIC APPENDECTOMY N/A 03/05/2017   Procedure: APPENDECTOMY LAPAROSCOPIC;  Surgeon: Judeth Horn, MD;  Location: [facility];  Service: General;  Laterality: N/A;   LEFT HEART CATHETERIZATION WITH CORONARY  ANGIOGRAM N/A 03/07/2013   Procedure: LEFT HEART  CATHETERIZATION WITH CORONARY ANGIOGRAM;  Surgeon: Burnell Blanks, MD;  Location[facility] CATH LAB;  Service: Cardiovascular;  Laterality: N/A;   MAZE N/A 04/05/2013   Procedure: MAZE;  Surgeon: Rexene Alberts, MD;  Location: [place];  Service: Open Heart Surgery;  Laterality: N/A;   Polinydal cyst     Removed   POLYPECTOMY     Retina repair-right     RETINAL DETACHMENT SURGERY Right    RD Repair - SB   RIGHT HEART CATH N/A 07/06/2019   Procedure: RIGHT HEART CATH;  Surgeon: Jolaine Artist, MD;  Location: [place] CV LAB;  Service: Cardiovascular;  Laterality: N/A;   RIGHT HEART CATHETERIZATION N/A 05/03/2014   Procedure: RIGHT HEART CATH;  Surgeon: Jolaine Artist, MD;  Location: [facility] CATH LAB;  Service: Cardiovascular;  Laterality: N/A;   TEE WITHOUT CARDIOVERSION N/A 08/23/2013   Procedure: TRANSESOPHAGEAL ECHOCARDIOGRAM (TEE);  Surgeon: Sanda Klein, MD;  Location: [facility] ENDOSCOPY;  Service: Cardiovascular;  Laterality: N/A;   TEE WITHOUT CARDIOVERSION N/A 10/26/2013   Procedure: TRANSESOPHAGEAL ECHOCARDIOGRAM (TEE);  Surgeon: Sueanne Margarita, MD;  Location[facility] ENDOSCOPY;  Service: Cardiovascular;  Laterality: N/A;   TEE WITHOUT CARDIOVERSION N/A 06/05/2014   Procedure: TRANSESOPHAGEAL ECHOCARDIOGRAM (TEE);  Surgeon: Thayer Headings, MD;  [facility] END[facility];  Service: Cardiovascular;  Laterality: N/A;   TRAPEZIUM RESECTION       Family History  Problem Relation Age of Onset   Dementia Father    Colon cancer Mother        Family History/Uncle    Colon polyps Mother        Family History   Atrial fibrillation Mother    Hypertension Mother    Colon polyps Sister        Family history   Diabetes Maternal Uncle    Stroke Paternal Uncle      Social History   Tobacco Use   Smoking status: Former    Packs/day: 4.00    Years: 25.00    Pack years: 100.00    Types: Cigarettes    Quit date: 01/05/1981    Years since quitting: 39.8   Smokeless tobacco: Never  Vaping Use    Vaping Use: Never used  Substance Use Topics   Alcohol use: Yes    Alcohol/week: 1.0 standard drink    Types: 1 Shots of liquor per week    Comment: 1-5 drinks per week   Drug use: No      ROS Constitutional: Denies fever, chills, weight loss/gain, headaches, insomnia,  night sweats or change in appetite. Does c/o fatigue. Eyes: Denies redness, blurred vision, diplopia, discharge, itchy or watery eyes.  ENT: Denies discharge, congestion, post nasal drip, epistaxis, sore throat, earache, hearing loss, dental pain, Tinnitus, Vertigo, Sinus pain or snoring.  Cardio: Denies chest pain, palpitations, irregular heartbeat, syncope, dyspnea, diaphoresis, orthopnea, PND, claudication or edema Respiratory: denies cough, dyspnea, DOE, pleurisy, hoarseness, laryngitis or wheezing.  Gastrointestinal: Denies dysphagia, heartburn, reflux, water brash, pain, cramps, nausea, vomiting, bloating, diarrhea, constipation, hematemesis, melena, hematochezia, jaundice or hemorrhoids Genitourinary: Denies dysuria, frequency, urgency, nocturia, hesitancy, discharge, hematuria or flank pain Musculoskeletal: Denies arthralgia, myalgia, stiffness, Jt. Swelling, pain, limp or strain/sprain. Denies Falls. Skin: Denies puritis, rash, hives, warts, acne, eczema or change in skin lesion Neuro: No weakness, tremor, incoordination, spasms, paresthesia or pain Psychiatric: Denies confusion, memory loss or sensory loss. Denies Depression. Endocrine: Denies change in weight, skin, hair change, nocturia, and  paresthesia, diabetic polys, visual blurring or hyper / hypo glycemic episodes.  Heme/Lymph: No excessive bleeding, bruising or enlarged lymph nodes.   Physical Exam  BP 114/70   Pulse 63   Temp 97.8 F (36.6 C)   Resp 16   Ht 5\' 11"  (1.803 m)   Wt 182 lb 6.4 oz (82.7 kg)   SpO2 97%   BMI 25.44 kg/m   General Appearance: Well nourished and well groomed and in no apparent distress.  Eyes: PERRLA, EOMs,  conjunctiva no swelling or erythema, normal fundi and vessels. Sinuses: No frontal/maxillary tenderness ENT/Mouth: EACs patent / TMs  nl. Nares clear without erythema, swelling, mucoid exudates. Oral hygiene is good. No erythema, swelling, or exudate. Tongue normal, non-obstructing. Tonsils not swollen or erythematous. Hearing normal.  Neck: Supple, thyroid not palpable. No bruits, nodes or JVD. Respiratory: Respiratory effort normal.  BS equal and clear bilateral without rales, rhonci, wheezing or stridor. Cardio: Heart sounds are normal with regular rate and rhythm and no murmurs, rubs or gallops. Peripheral pulses are normal and equal bilaterally without edema. No aortic or femoral bruits. Chest: symmetric with normal excursions and percussion.  Abdomen: Soft, with Nl bowel sounds. Nontender, no guarding, rebound, hernias, masses, or organomegaly.  Lymphatics: Non tender without lymphadenopathy.  Musculoskeletal: Full ROM all peripheral extremities, joint stability, 5/5 strength, and normal gait. Skin: Warm and dry without rashes, lesions, cyanosis, clubbing or  ecchymosis.  Neuro: Cranial nerves intact, reflexes equal bilaterally. Normal muscle tone, no cerebellar symptoms. Sensation intact.  Pysch: Alert and oriented X 3 with normal affect, insight and judgment appropriate.   Assessment and Plan  1. Annual Preventative/Screening Exam    2. Essential hypertension  - EKG 12-Lead - [place], RETROPERITNL ABD,  LTD - Urinalysis, Routine w reflex microscopic - Microalbumin / creatinine urine ratio - CBC with Differential/Platelet - COMPLETE METABOLIC PANEL WITH GFR - Magnesium - TSH  3. Hyperlipidemia associated with type 2 diabetes mellitus ([place])  - EKG 12-Lead - [place], RETROPERITNL ABD,  LTD - Lipid panel - TSH  4. Diabetes mellitus due to underlying condition with stage 3b chronic  kidney disease, with long-term current use of insulin (HCC)  - EKG 12-Lead - [place], RETROPERITNL ABD,   LTD - HM DIABETES FOOT EXAM - LOW EXTREMITY NEUR EXAM DOCUM - PTH, intact and calcium - Hemoglobin A1c  5. Secondary hyperparathyroidism, renal (HCC)  - PTH, intact and calcium - COMPLETE METABOLIC PANEL WITH GFR  6. Chronic diastolic congestive heart failure (HCC)  - EKG 12-Lead  7. Coronary artery disease involving native coronary artery of native heart without angina pectoris  - EKG 12-Lead - Lipid panel  8. Atrial fibrillation, chronic (HCC)  - EKG 12-Lead - TSH - Protime-INR  9. BPH with obstruction/lower urinary tract symptoms  - PSA  10. Screening for colorectal cancer  - POC Hemoccult Bld/Stl (3-Cd Home Screen); Future  11. Pulmonary hypertension ([place])   12. Vitamin D deficiency  - VITAMIN D 25 Hydroxy (Vit-D Deficiency, Fractures)  13. Chronic anticoagulation  - Protime-INR  14. OSA on CPAP   15. Screening for ischemic heart disease  - EKG 12-Lead  16. Screening for AAA (aortic abdominal aneurysm)  - [place], RETROPERITNL ABD,  LTD  17. FHx: heart disease  - EKG 12-Lead - [place], RETROPERITNL ABD,  LTD  18. Former smoker  - EKG 12-Lead - [place], RETROPERITNL ABD,  LTD  19. Medication management  - Urinalysis, Routine w reflex microscopic - Microalbumin / creatinine urine ratio  20. Prostate cancer screening  - PSA - PTH, intact and calcium - CBC with Differential/Platelet - COMPLETE METABOLIC PANEL WITH GFR - Magnesium - Lipid panel - TSH - Hemoglobin A1c - VITAMIN D 25 Hydroxy  21. CKD stage 3 due to type 2 diabetes mellitus ([place])           Patient was counseled in prudent diet, weight control to achieve/maintain BMI less than 25, BP monitoring, regular exercise and medications as discussed.  Discussed med effects and SE's. Routine screening labs and tests as requested with regular follow-up as recommended. Over 40 minutes of exam, counseling, chart review and high complex critical decision making was performed   Kirtland Bouchard, MD

## 2020-10-23 NOTE — Patient Instructions (Signed)

## 2020-10-24 LAB — HEMOGLOBIN A1C
Hgb A1c MFr Bld: 6.3 % of total Hgb — ABNORMAL HIGH (ref <5.7)
Mean Plasma Glucose: 134 mg/dL
eAG (mmol/L): 7.4 mmol/L

## 2020-10-24 LAB — CBC WITH DIFFERENTIAL/PLATELET
Absolute Monocytes: 467 cells/uL (ref 200–950)
Basophils Absolute: 37 cells/uL (ref 0–200)
Basophils Relative: 0.5 %
Eosinophils Absolute: 29 cells/uL (ref 15–500)
Eosinophils Relative: 0.4 %
HCT: 39.6 % (ref 38.5–50.0)
Hemoglobin: 12.8 g/dL — ABNORMAL LOW (ref 13.2–17.1)
Lymphs Abs: 569 cells/uL — ABNORMAL LOW (ref 850–3900)
MCH: 30.3 pg (ref 27.0–33.0)
MCHC: 32.3 g/dL (ref 32.0–36.0)
MCV: 93.8 fL (ref 80.0–100.0)
MPV: 9.1 fL (ref 7.5–12.5)
Monocytes Relative: 6.4 %
Neutro Abs: 6198 cells/uL (ref 1500–7800)
Neutrophils Relative %: 84.9 %
Platelets: 185 10*3/uL (ref 140–400)
RBC: 4.22 10*6/uL (ref 4.20–5.80)
RDW: 16 % — ABNORMAL HIGH (ref 11.0–15.0)
Total Lymphocyte: 7.8 %
WBC: 7.3 10*3/uL (ref 3.8–10.8)

## 2020-10-24 LAB — COMPLETE METABOLIC PANEL WITH GFR
AG Ratio: 1.6 (calc) (ref 1.0–2.5)
ALT: 31 U/L (ref 9–46)
AST: 30 U/L (ref 10–35)
Albumin: 4.2 g/dL (ref 3.6–5.1)
Alkaline phosphatase (APISO): 173 U/L — ABNORMAL HIGH (ref 35–144)
BUN/Creatinine Ratio: 17 (calc) (ref 6–22)
BUN: 28 mg/dL — ABNORMAL HIGH (ref 7–25)
CO2: 29 mmol/L (ref 20–32)
Calcium: 8.9 mg/dL (ref 8.6–10.3)
Chloride: 108 mmol/L (ref 98–110)
Creat: 1.64 mg/dL — ABNORMAL HIGH (ref 0.70–1.22)
Globulin: 2.6 g/dL (calc) (ref 1.9–3.7)
Glucose, Bld: 140 mg/dL — ABNORMAL HIGH (ref 65–99)
Potassium: 4.7 mmol/L (ref 3.5–5.3)
Sodium: 144 mmol/L (ref 135–146)
Total Bilirubin: 0.9 mg/dL (ref 0.2–1.2)
Total Protein: 6.8 g/dL (ref 6.1–8.1)
eGFR: 42 mL/min/{1.73_m2} — ABNORMAL LOW (ref >=60)

## 2020-10-24 LAB — URINALYSIS, ROUTINE W REFLEX MICROSCOPIC
Bilirubin Urine: NEGATIVE
Hgb urine dipstick: NEGATIVE
Ketones, ur: NEGATIVE
Leukocytes,Ua: NEGATIVE
Nitrite: NEGATIVE
Protein, ur: NEGATIVE
Specific Gravity, Urine: 1.01 (ref 1.001–1.035)
pH: 6.5 (ref 5.0–8.0)

## 2020-10-24 LAB — LIPID PANEL
Cholesterol: 143 mg/dL (ref <200)
HDL: 28 mg/dL — ABNORMAL LOW (ref >=40)
LDL Cholesterol (Calc): 89 mg/dL (calc)
Non-HDL Cholesterol (Calc): 115 mg/dL (calc) (ref <130)
Total CHOL/HDL Ratio: 5.1 (calc) — ABNORMAL HIGH (ref <5.0)
Triglycerides: 163 mg/dL — ABNORMAL HIGH (ref <150)

## 2020-10-24 LAB — MAGNESIUM: Magnesium: 2.6 mg/dL — ABNORMAL HIGH (ref 1.5–2.5)

## 2020-10-24 LAB — PTH, INTACT AND CALCIUM
Calcium: 8.9 mg/dL (ref 8.6–10.3)
PTH: 191 pg/mL — ABNORMAL HIGH (ref 16–77)

## 2020-10-24 LAB — MICROALBUMIN / CREATININE URINE RATIO
Creatinine, Urine: 22 mg/dL (ref 20–320)
Microalb Creat Ratio: 205 mcg/mg creat — ABNORMAL HIGH (ref <30)
Microalb, Ur: 4.5 mg/dL

## 2020-10-24 LAB — PROTIME-INR
INR: 3.4 — ABNORMAL HIGH
Prothrombin Time: 31.7 s — ABNORMAL HIGH (ref 9.0–11.5)

## 2020-10-24 LAB — VITAMIN D 25 HYDROXY (VIT D DEFICIENCY, FRACTURES): Vit D, 25-Hydroxy: 53 ng/mL (ref 30–100)

## 2020-10-24 LAB — PSA: PSA: 0.36 ng/mL (ref <=4.00)

## 2020-10-24 LAB — TSH: TSH: 1.64 mIU/L (ref 0.40–4.50)

## 2020-10-24 NOTE — Progress Notes (Signed)
============================================================ - Test results slightly outside the reference range are not unusual. If there is anything important, I will review this with you,  otherwise it is considered normal test values.  If you have further questions,  please do not hesitate to contact me at the office or via My Chart.  ============================================================ ============================================================  -  PSA - Very Low - Great !  ============================================================ ============================================================  -  PTH is a hormone that regulates calcium balance  &              it's slightly elevated as a result of your Kidney Impairment, but                                                                             Calcium level is normal & OK  ============================================================ ============================================================  -  CBC is OK - Hgb or Red cell count is staying up in a good healthy range ============================================================ ============================================================  -  Glucose = 140  mg %  - is OK  ============================================================ ============================================================  -  Kidney Function   (  GFR = 42 is stable in Stage 3b  )   ============================================================ ============================================================  -  Total Chol - 143    & LDL Chol = 89 - Both  Excellent   - Very low risk for Heart Attack  / Stroke ============================================================ ============================================================  -  A1c is better  -- Down from     7.0%    to    now    6.3%             (  Ideal or Goal is less than  5.7%  !  )   ============================================================ ============================================================  -  Vitamin D = 53 - OK  ============================================================ ============================================================  - Protime / INR = 3.4   -  is up a little              (  Ideal or Goal is between 2.0 to 2.8 x )   - So, if currently on 7.5 mg  - 6 days /week    &      10 mg - 1 day /week , then                                            (   11 tabs / week   =   55 mg   )    - recommend leave off for 4- 5 days & then restart &   take 7.5 mg  -  5 x /week   &       5 mg  2 x /week  - Sun / Thurs                                            (   9.5 tabs  / week  = 47.5 mg  )   ============================================================ ============================================================

## 2020-10-30 ENCOUNTER — Other Ambulatory Visit: Payer: Self-pay

## 2020-10-30 ENCOUNTER — Encounter: Payer: Self-pay | Admitting: Internal Medicine

## 2020-10-30 ENCOUNTER — Ambulatory Visit (INDEPENDENT_AMBULATORY_CARE_PROVIDER_SITE_OTHER): Payer: Medicare Other | Admitting: Internal Medicine

## 2020-10-30 VITALS — BP 148/80 | HR 67 | Temp 97.6°F | Resp 16 | Ht 71.0 in | Wt 184.2 lb

## 2020-10-30 DIAGNOSIS — M545 Low back pain, unspecified: Secondary | ICD-10-CM

## 2020-10-30 MED ORDER — DEXAMETHASONE 4 MG PO TABS: ORAL_TABLET | ORAL | 0 refills | Status: DC

## 2020-10-30 MED ORDER — CYCLOBENZAPRINE HCL 10 MG PO TABS: ORAL_TABLET | ORAL | 0 refills | Status: DC

## 2020-10-30 NOTE — Progress Notes (Signed)
Future Appointments  Date Time Provider [place]  11/06/2020 12:30 PM Hayden Pedro, MD [facility] None  11/14/2020  3:30 PM [name], MD [facility] None  12/10/2020  3:00 PM Newton Pigg, [facility] None  02/26/2021  3:30 PM Liane Comber, NP [facility] None  04/02/2021  2:00 PM Brunetta Genera, MD [place] None  10/23/2021 11:00 AM [name], MD [facility] None    History of Present Illness:          This very nice 80 y.o. MWM with multiple co-morbidities -  HTN, HLD, T2_DM, CPAP/OSA & Vitamin D Deficiency. Patient is also followed by Dr Irene Limbo for chronic Iron Deficiency for periodic iron infusions presents with a 5-6 day hx/o LBP which he attributes to reinstalling a mailbox on a post.   Medications  Current Outpatient Medications (Endocrine & Metabolic):    dapagliflozin propanediol (FARXIGA) 5 MG TABS tablet, Take 1 tablet (5 mg total) by mouth daily before breakfast.   insulin NPH-regular Human (70-30) 100 UNIT/ML injection, Inject 45 Units into the skin daily with breakfast. 25units in the evening  Current Outpatient Medications (Cardiovascular):    atorvastatin (LIPITOR) 20 MG tablet, Take  1 tablet  3 x /week  for Cholesterol   carvedilol (COREG) 6.25 MG tablet, TAKE ONE-HALF TABLET BY  MOUTH IN THE MORNING AND 1  TABLET BY MOUTH IN THE  EVENING   metolazone (ZAROXOLYN) 2.5 MG tablet, Take 2.5 mg by mouth daily. Only as needed for CHF related weight gain   metolazone (ZAROXOLYN) 5 MG tablet, 1 tablet as needed   torsemide (DEMADEX) 100 MG tablet, Take 150 mg by mouth daily. 1.5 tablet  Current Outpatient Medications (Respiratory):    azelastine (ASTELIN) 0.1 % nasal spray, Place 2 sprays into both nostrils 2 (two) times daily as needed for rhinitis or allergies.   diphenhydrAMINE (BENADRYL) 25 MG tablet, Take 25 mg by mouth as needed.   fluticasone (FLONASE) 50 MCG/ACT nasal spray, Place 1 spray into both nostrils daily as needed for  allergies.   loratadine (CLARITIN) 10 MG tablet, Take 10 mg by mouth daily as needed for allergies.   Current Outpatient Medications (Analgesics):    acetaminophen (TYLENOL) 500 MG tablet, Take 1,000 mg by mouth every 6 (six) hours as needed for moderate pain or headache.    allopurinol (ZYLOPRIM) 300 MG tablet, Take 300 mg by mouth daily.   colchicine 0.6 MG tablet, Take 0.6 mg by mouth every other day.   HYDROcodone-acetaminophen (NORCO/VICODIN) 5-325 MG tablet, 1 tablet as needed  Current Outpatient Medications (Hematological):    Iron-FA-B Cmp-C-Biot-Probiotic (FUSION PLUS) CAPS, Take 1 capsule by mouth daily. (Patient not taking: Reported on 09/13/2020)   warfarin (COUMADIN) 10 MG tablet, Take 10 mg by mouth 2 (two) times a week.   warfarin (COUMADIN) 5 MG tablet, Take    1.5 tablets /day      or as directed (Patient taking differently: Take 1.5 tablets by mouth x 5 days weekly)  Current Outpatient Medications (Other):    ACCU-CHEK AVIVA PLUS test strip, CHECK BLOOD GLUCOSE 3 TIMES DAILY   ALPRAZolam (XANAX) 1 MG tablet, Take 0.5 tablets (0.5 mg total) by mouth at bedtime as needed for sleep.   Alum Hydroxide-Mag Carbonate (GAVISCON PO), Take 4 tablets by mouth daily as needed (acid reflux).    Ascorbic Acid (VITAMIN C) 1000 MG tablet, Take 1,000 mg by mouth daily.   B Complex Vitamins (VITAMIN B COMPLEX PO), Take 1 tablet by mouth  at bedtime.    bacitracin 500 UNIT/GM ointment, Apply 1 application topically daily as needed for wound care.   Blood Glucose Monitoring Suppl (ACCU-CHEK AVIVA PLUS) w/Device KIT, Check blood sugar 1 time  daily   Blood Glucose Monitoring Suppl DEVI, Test blood sugar up to three times a day or as directed.   Cholecalciferol (VITAMIN D PO), Take 5,000 Units by mouth daily.    Continuous Blood Gluc Receiver ([place]) DEVI, Use to check blood sugar multiple times a day for Hypoglycemia and Hyperglycemia   Continuous Blood Gluc Sensor (DEXCOM G6 SENSOR)  MISC, Use to check blood sugar multiple times a day for Hypoglycemia and Hyperglycemia   Continuous Blood Gluc Transmit (DEXCOM G6 TRANSMITTER) MISC, Use to check blood sugar multiple times a day for Hypoglycemia and Hyperglycemia   Multiple Vitamins-Minerals (PRESERVISION AREDS 2) CAPS, Take 1 capsule by mouth 2 (two) times daily.    polyvinyl alcohol (LIQUIFILM TEARS) 1.4 % ophthalmic solution, Place 1 drop into both eyes daily as needed for dry eyes.   potassium chloride SA (KLOR-CON) 20 MEQ tablet, TAKE 2 TABLETS BY MOUTH 3  TIMES DAILY FOR POTASSIUM   sertraline (ZOLOFT) 100 MG tablet, TAKE 1 TABLET BY MOUTH  DAILY FOR MOOD   trimethoprim-polymyxin b (POLYTRIM) ophthalmic solution, Only when he has eye injections   zinc gluconate 50 MG tablet, Take 50 mg by mouth daily.  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; Type 2 diabetes mellitus with stage 4 chronic kidney disease, with long-term current use of insulin ([place]); GERD; DJD (degenerative joint disease); Vitamin D deficiency; Chronic anticoagulation (INR goal 2.0-2.5); Essential hypertension; CAD (coronary artery disease); PFO (patent foramen ovale); 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[place]); OSA on CPAP; S/P Maze operation for atrial fibrillation; Pulmonary hypertension ([place]); 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   Observations/Objective:  BP (!) 148/80   Pulse 67   Temp 97.6 F (36.4 C)   Resp 16   Ht $R'5\' 11"'iE$  (1.803 m)   Wt 184 lb 3.2 oz (83.6 kg)   SpO2 95%   BMI 25.69 kg/m   HEENT - WNL. Neck - supple.  Chest - Clear equal BS. Cor - Nl HS. RRR w/o sig MGR. PP 1(+). No edema. MS- FROM w/o deformities.    Tender low para lumbar spasm . Gait Nl. Neuro -  Nl w/o focal abnormalities.  Assessment and Plan:   1. Acute midline low back pain without sciatica  -Dexamethasone 4 MG tablet;  Take 1 tab 3 x day - 3 days, then 2 x day - 3 days, then 1 tab daily   Dispense: 20 tablet  - Cyclobenzaprine  10 MG tablet;  Take 1/2 to 1 tablet 3 x /day as needed for Muscle Spasm   Dispense: 60 tablet   Follow Up Instructions:      I discussed the assessment and treatment plan with the patient. The patient was provided an opportunity to ask questions and  all were answered. The patient agreed with the plan and demonstrated an understanding of the instructions.       The patient was advised to call back or seek an in-person evaluation if the symptoms worsen or if the condition fails to improve as anticipated.   Kirtland Bouchard, MD

## 2020-10-31 ENCOUNTER — Ambulatory Visit: Payer: Medicare Other | Admitting: Internal Medicine

## 2020-11-02 ENCOUNTER — Emergency Department (HOSPITAL_BASED_OUTPATIENT_CLINIC_OR_DEPARTMENT_OTHER): Payer: Medicare Other

## 2020-11-02 ENCOUNTER — Emergency Department (HOSPITAL_BASED_OUTPATIENT_CLINIC_OR_DEPARTMENT_OTHER)
Admission: EM | Admit: 2020-11-02 | Discharge: 2020-11-02 | Disposition: A | Discharge status: Home / Self Care | Payer: Medicare Other | Attending: Emergency Medicine | Admitting: Emergency Medicine

## 2020-11-02 ENCOUNTER — Encounter (HOSPITAL_BASED_OUTPATIENT_CLINIC_OR_DEPARTMENT_OTHER): Payer: Self-pay | Admitting: Emergency Medicine

## 2020-11-02 ENCOUNTER — Other Ambulatory Visit: Payer: Self-pay

## 2020-11-02 DIAGNOSIS — I2510 Atherosclerotic heart disease of native coronary artery without angina pectoris: Secondary | ICD-10-CM | POA: Insufficient documentation

## 2020-11-02 DIAGNOSIS — R109 Unspecified abdominal pain: Secondary | ICD-10-CM | POA: Insufficient documentation

## 2020-11-02 DIAGNOSIS — E1122 Type 2 diabetes mellitus with diabetic chronic kidney disease: Secondary | ICD-10-CM | POA: Insufficient documentation

## 2020-11-02 DIAGNOSIS — X58XXXA Exposure to other specified factors, initial encounter: Secondary | ICD-10-CM | POA: Diagnosis not present

## 2020-11-02 DIAGNOSIS — Z794 Long term (current) use of insulin: Secondary | ICD-10-CM | POA: Diagnosis not present

## 2020-11-02 DIAGNOSIS — Z7951 Long term (current) use of inhaled steroids: Secondary | ICD-10-CM | POA: Insufficient documentation

## 2020-11-02 DIAGNOSIS — I5032 Chronic diastolic (congestive) heart failure: Secondary | ICD-10-CM | POA: Insufficient documentation

## 2020-11-02 DIAGNOSIS — Z85828 Personal history of other malignant neoplasm of skin: Secondary | ICD-10-CM | POA: Insufficient documentation

## 2020-11-02 DIAGNOSIS — N184 Chronic kidney disease, stage 4 (severe): Secondary | ICD-10-CM | POA: Diagnosis not present

## 2020-11-02 DIAGNOSIS — S34109A Unspecified injury to unspecified level of lumbar spinal cord, initial encounter: Secondary | ICD-10-CM | POA: Diagnosis present

## 2020-11-02 DIAGNOSIS — Z951 Presence of aortocoronary bypass graft: Secondary | ICD-10-CM | POA: Diagnosis not present

## 2020-11-02 DIAGNOSIS — R52 Pain, unspecified: Secondary | ICD-10-CM

## 2020-11-02 DIAGNOSIS — Z7901 Long term (current) use of anticoagulants: Secondary | ICD-10-CM | POA: Insufficient documentation

## 2020-11-02 DIAGNOSIS — S32010A Wedge compression fracture of first lumbar vertebra, initial encounter for closed fracture: Secondary | ICD-10-CM | POA: Insufficient documentation

## 2020-11-02 DIAGNOSIS — I130 Hypertensive heart and chronic kidney disease with heart failure and stage 1 through stage 4 chronic kidney disease, or unspecified chronic kidney disease: Secondary | ICD-10-CM | POA: Diagnosis not present

## 2020-11-02 DIAGNOSIS — I4891 Unspecified atrial fibrillation: Secondary | ICD-10-CM | POA: Insufficient documentation

## 2020-11-02 DIAGNOSIS — Z79899 Other long term (current) drug therapy: Secondary | ICD-10-CM | POA: Diagnosis not present

## 2020-11-02 DIAGNOSIS — Z87891 Personal history of nicotine dependence: Secondary | ICD-10-CM | POA: Diagnosis not present

## 2020-11-02 DIAGNOSIS — M545 Low back pain, unspecified: Secondary | ICD-10-CM | POA: Diagnosis not present

## 2020-11-02 LAB — URINALYSIS, ROUTINE W REFLEX MICROSCOPIC
Bilirubin Urine: NEGATIVE
Glucose, UA: 500 mg/dL — AB
Hgb urine dipstick: NEGATIVE
Ketones, ur: NEGATIVE mg/dL
Leukocytes,Ua: NEGATIVE
Nitrite: NEGATIVE
Protein, ur: NEGATIVE mg/dL
Specific Gravity, Urine: 1.007 (ref 1.005–1.030)
pH: 5.5 (ref 5.0–8.0)

## 2020-11-02 MED ORDER — OXYCODONE HCL 5 MG PO TABS: 5.0000 mg | ORAL_TABLET | Freq: Four times a day (QID) | ORAL | 0 refills | Status: DC | PRN

## 2020-11-02 MED ORDER — ACETAMINOPHEN 325 MG PO TABS: 650.0000 mg | ORAL_TABLET | Freq: Four times a day (QID) | ORAL | 0 refills | Status: DC | PRN

## 2020-11-02 MED ORDER — OXYCODONE-ACETAMINOPHEN 5-325 MG PO TABS
1.0000 | ORAL_TABLET | Freq: Once | ORAL | Status: AC
Start: 2020-11-02 — End: 2020-11-02
  Administered 2020-11-02: 1 via ORAL
  Filled 2020-11-02: qty 1

## 2020-11-02 MED ORDER — SENNOSIDES-DOCUSATE SODIUM 8.6-50 MG PO TABS: 1.0000 | ORAL_TABLET | Freq: Every day | ORAL | 0 refills | Status: AC | PRN

## 2020-11-02 NOTE — ED Provider Notes (Signed)
[facility] Provider Note   CSN: [id number] Arrival date & time: 11/02/20  1420     History Chief Complaint  Patient presents with   Back Pain    [name]. is a 80 y.o. male with history of chronic kidney disease, hypertension, presented emergency department with lower back pain.  The patient reports that [name] was replacing a mailbox approximately 9 days ago, but does not recall any injury at that time or any pain.  [name] reports that 7 days ago [name] woke up with back pain.  [name] describes a stiffness and stretching pain in his bilateral flanks that [name] has never had before, which did not radiate down his leg.  They are worse with movements, particularly when trying to sit up or lie down.  It has been keeping up at night.  The pain is currently high intensity.  It did not respond to tylenol, flexeril, or decadron offered by PCP.    [name] denies nausea, vomiting, diarrhea.  [name] did take a Norco that [name] had leftover at home and did have some temporary relief, although this was at night [name] said [name] fell asleep immediately.  [name] denies any known trauma or falls.  [name] denies any history of compression fractures of the spine that [name] is aware of, nor any history of bony mets, spine surgery, or other issues with his back.  [name] denies any history of AAA, reports that [name] quit smoking many years ago, denies any family history of aneurysm.  HPI     Past Medical History:  Diagnosis Date   Adrenal adenoma    Arthritis    Atypical atrial flutter ([place]) 8/15, 10/15   a. DCCV 08/2013. b. s/p RFA 10/2013.   Basal cell carcinoma    CAD (coronary artery disease)    a. 04/2013 CABG x 2: LIMA to LAD, SVG to RI, EVH via R thigh.   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 s/p MAZE 04/2013 in setting of CABG. b. Amio stopped in 10/2013 after flutter ablation.   PFO (patent foramen ovale)    a. Small PFO by TEE 10/2013.   Pleural effusion, left    a. s/p thoracentesis 05/2013.   Respiratory failure ([place])    a. Hypoxia 10/2013 - required supp O2 as inpatient, did not require it at discharge.   S/P Maze operation for atrial fibrillation    a. 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   Chronic restrictive lung disease 09/03/2014   Pulmonary hypertension ([place]) 08/23/2014   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[place]) 01/30/2014   OSA on CPAP 01/30/2014   PFO (patent foramen ovale) 10/30/2013   CAD (coronary artery disease) 10/29/2013   Essential hypertension 10/25/2013   Chronic anticoagulation (INR goal 2.0-2.5) 08/22/2013   Atrial flutter ([place]) 08/21/2013   Vitamin D deficiency 05/19/2013   Type 2 diabetes  mellitus with stage 4 chronic kidney disease, with long-term current use of insulin (HCC)    GERD    DJD (degenerative joint disease)    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 04/05/2013   S/P Maze operation for atrial fibrillation 04/05/2013   Chronic diastolic congestive heart failure ([place])    Hypertensive cardiomyopathy ([place])    Hyperlipidemia associated with type 2 diabetes mellitus ([place]) 09/03/2008   PAF (paroxysmal atrial fibrillation) ([place]) 09/03/2008   Diverticulosis of large intestine 09/03/2008   Colonic Polyps 09/03/2008    Past Surgical History:  Procedure Laterality Date   APPENDECTOMY  03/05/2017   laproscopic   ATRIAL FIBRILLATION ABLATION N/A 10/26/2013   Procedure: ATRIAL FIBRILLATION ABLATION;  Surgeon: Coralyn Mark, MD;  Location: [facility] C[facility] LAB;  Service: Cardiovascular;  Laterality: N/A;   BASAL CELL CARCINOMA EXCISION     x3 on face   BIOPSY  06/12/2020   Procedure: BIOPSY;  Surgeon: Jerene Bears, MD;  Location: [facility] ENDOSCOPY;  Service: Gastroenterology;;   CARDIAC CATHETERIZATION     myocardial bridge but no cad   CARDIOVERSION N/A 08/23/2013   Procedure: CARDIOVERSION;  Surgeon: Sanda Klein, MD;  Location: [place];  Service: Cardiovascular;  Laterality: N/A;   CARPOMETACARPEL SUSPENSION PLASTY Left 02/14/2014   Procedure: CARPOMETACARPEL ([place]) SUSPENSIONPLASTY THUMB  WITH  ABDUCTOR POLLICIS LONGUS TRANSFER AND STENOSING TENOSYNOVITIS RELEASE LEFT WRIST;  Surgeon: Charlotte Crumb, MD;  Location: [place];  Service: Orthopedics;  Laterality: Left;   CATARACT EXTRACTION Bilateral    COLONOSCOPY WITH PROPOFOL N/A 01/26/2017   Procedure: COLONOSCOPY WITH PROPOFOL;  Surgeon: Doran Stabler, MD;  Location: [facility];  Service: Gastroenterology;  Laterality: N/A;   CORONARY ARTERY BYPASS GRAFT N/A 04/05/2013   Procedure: CORONARY ARTERY BYPASS GRAFTING (CABG) TIMES TWO USING LEFT INTERNAL MAMMARY ARTERY AND RIGHT SAPHENOUS LEG VEIN  HARVESTED ENDOSCOPICALLY;  Surgeon: Rexene Alberts, MD;  Location: [place];  Service: Open Heart Surgery;  Laterality: N/A;   ENTEROSCOPY N/A 06/12/2020   Procedure: ENTEROSCOPY;  Surgeon: Jerene Bears, MD;  Location: [facility];  Service: Gastroenterology;  Laterality: N/A;   ESOPHAGOGASTRODUODENOSCOPY (EGD) WITH PROPOFOL N/A 01/26/2017   Procedure: ESOPHAGOGASTRODUODENOSCOPY (EGD) WITH PROPOFOL;  Surgeon: Doran Stabler, MD;  Location: [facility];  Service: Gastroenterology;  Laterality: N/A;   EYE SURGERY Bilateral    Cat Sx   EYE SURGERY Right    RD repair - SB   GREAT TOE ARTHRODESIS, INTERPHALANGEAL JOINT     Right foot   HOT HEMOSTASIS N/A 06/12/2020   Procedure: HOT HEMOSTASIS (ARGON PLASMA COAGULATION/BICAP);  Surgeon: Jerene Bears, MD;  Location: [facility];  Service: Gastroenterology;  Laterality: N/A;  INTRAOPERATIVE TRANSESOPHAGEAL ECHOCARDIOGRAM N/A 04/05/2013   Procedure: INTRAOPERATIVE TRANSESOPHAGEAL ECHOCARDIOGRAM;  Surgeon: Rexene Alberts, MD;  Location: [facility];  Service: Open Heart Surgery;  Laterality: N/A;   LAPAROSCOPIC APPENDECTOMY N/A 03/05/2017   Procedure: APPENDECTOMY LAPAROSCOPIC;  Surgeon: Judeth Horn, MD;  Location: [place];  Service: General;  Laterality: N/A;   LEFT HEART CATHETERIZATION WITH CORONARY ANGIOGRAM N/A 03/07/2013   Procedure: LEFT HEART CATHETERIZATION WITH CORONARY ANGIOGRAM;  Surgeon: Burnell Blanks, MD;  Location[facility] CATH LAB;  Service: Cardiovascular;  Laterality: N/A;   MAZE N/A 04/05/2013   Procedure: MAZE;  Surgeon: Rexene Alberts, MD;  Location: [place];  Service: Open Heart Surgery;  Laterality: N/A;   Polinydal cyst     Removed   POLYPECTOMY     Retina repair-right     RETINAL DETACHMENT SURGERY Right    RD Repair - SB   RIGHT HEART CATH N/A 07/06/2019   Procedure: RIGHT HEART CATH;  Surgeon: Jolaine Artist, MD;  Location: [place] CV LAB;  Service: Cardiovascular;  Laterality: N/A;   RIGHT HEART CATHETERIZATION N/A  05/03/2014   Procedure: RIGHT HEART CATH;  Surgeon: Jolaine Artist, MD;  Location: [facility] CATH LAB;  Service: Cardiovascular;  Laterality: N/A;   TEE WITHOUT CARDIOVERSION N/A 08/23/2013   Procedure: TRANSESOPHAGEAL ECHOCARDIOGRAM (TEE);  Surgeon: Sanda Klein, MD;  Location: [facility] ENDOSCOPY;  Service: Cardiovascular;  Laterality: N/A;   TEE WITHOUT CARDIOVERSION N/A 10/26/2013   Procedure: TRANSESOPHAGEAL ECHOCARDIOGRAM (TEE);  Surgeon: Sueanne Margarita, MD;  Location: [facility] ENDOSCOPY;  Service: Cardiovascular;  Laterality: N/A;   TEE WITHOUT CARDIOVERSION N/A 06/05/2014   Procedure: TRANSESOPHAGEAL ECHOCARDIOGRAM (TEE);  Surgeon: Thayer Headings, MD;  Location: [facility] ENDOSCOPY;  Service: Cardiovascular;  Laterality: N/A;   TRAPEZIUM RESECTION         Family History  Problem Relation Age of Onset   Dementia Father    Colon cancer Mother        Family History/Uncle    Colon polyps Mother        Family History   Atrial fibrillation Mother    Hypertension Mother    Colon polyps Sister        Family history   Diabetes Maternal Uncle    Stroke Paternal Uncle     Social History   Tobacco Use   Smoking status: Former    Packs/day: 4.00    Years: 25.00    Pack years: 100.00    Types: Cigarettes    Quit date: 01/05/1981    Years since quitting: 39.8   Smokeless tobacco: Never  Vaping Use   Vaping Use: Never used  Substance Use Topics   Alcohol use: Yes    Alcohol/week: 1.0 standard drink    Types: 1 Shots of liquor per week    Comment: 1-5 drinks per week   Drug use: No    Home Medications Prior to Admission medications   Medication Sig Start Date End Date Taking? Authorizing Provider  acetaminophen (TYLENOL) 325 MG tablet Take 2 tablets (650 mg total) by mouth every 6 (six) hours as needed for up to 30 doses for moderate pain or mild pain. 11/02/20  Yes Benjaman Artman, Carola Rhine, MD  oxyCODONE (ROXICODONE) 5 MG immediate release tablet Take 1 tablet (5 mg total) by mouth every 6 (six) hours  as needed for up to 10 doses for severe pain. 11/02/20  Yes Wyvonnia Dusky, MD  senna-docusate (SENOKOT-S) 8.6-50 MG tablet Take 1 tablet by mouth daily as  needed for up to 20 doses for mild constipation. Take with roxicodone to prevent constipation 11/02/20  Yes Kage Willmann, Carola Rhine, MD  ACCU-CHEK AVIVA PLUS test strip CHECK BLOOD GLUCOSE 3 TIMES DAILY 11/26/19   [name], MD  acetaminophen (TYLENOL) 500 MG tablet Take 1,000 mg by mouth every 6 (six) hours as needed for moderate pain or headache.     [provider]  allopurinol (ZYLOPRIM) 300 MG tablet Take 300 mg by mouth daily.    [provider]  ALPRAZolam Duanne Moron) 1 MG tablet Take 0.5 tablets (0.5 mg total) by mouth at bedtime as needed for sleep. 03/23/16   [name], MD  Alum Hydroxide-Mag Carbonate (GAVISCON PO) Take 4 tablets by mouth daily as needed (acid reflux).     [provider]  Ascorbic Acid (VITAMIN C) 1000 MG tablet Take 1,000 mg by mouth daily.    [provider]  atorvastatin (LIPITOR) 20 MG tablet Take  1 tablet  3 x /week  for Cholesterol 04/21/20   [name], MD  azelastine (ASTELIN) 0.1 % nasal spray Place 2 sprays into both nostrils 2 (two) times daily as needed for rhinitis or allergies. 04/30/20   [name], MD  B Complex Vitamins (VITAMIN B COMPLEX PO) Take 1 tablet by mouth at bedtime.     [provider]  bacitracin 500 UNIT/GM ointment Apply 1 application topically daily as needed for wound care.    [provider]  Blood Glucose Monitoring Suppl (ACCU-CHEK AVIVA PLUS) w/Device KIT Check blood sugar 1 time  daily 07/12/15   [name], MD  Blood Glucose Monitoring Suppl DEVI Test blood sugar up to three times a day or as directed. 05/24/17   Liane Comber, NP  carvedilol (COREG) 6.25 MG tablet TAKE ONE-HALF TABLET BY  MOUTH IN THE MORNING AND 1  TABLET BY MOUTH IN THE  EVENING 07/15/20   Bensimhon, Shaune Pascal, MD  Cholecalciferol (VITAMIN  D PO) Take 5,000 Units by mouth daily.     [provider]  colchicine 0.6 MG tablet Take 0.6 mg by mouth every other day.    [provider]  Continuous Blood Gluc Receiver (DEXCOM G6 RECEIVER) DEVI Use to check blood sugar multiple times a day for Hypoglycemia and Hyperglycemia 08/19/20   [name], MD  Continuous Blood Gluc Sensor (DEXCOM G6 SENSOR) MISC Use to check blood sugar multiple times a day for Hypoglycemia and Hyperglycemia 08/19/20   [name], MD  Continuous Blood Gluc Transmit (DEXCOM G6 TRANSMITTER) MISC Use to check blood sugar multiple times a day for Hypoglycemia and Hyperglycemia 08/19/20   [name], MD  cyclobenzaprine (FLEXERIL) 10 MG tablet Take 1/2 to 1 tablet 3 x /day as needed for Muscle Spasm 10/30/20   [name], MD  dapagliflozin propanediol (FARXIGA) 5 MG TABS tablet Take 1 tablet (5 mg total) by mouth daily before breakfast. 07/04/20   Bensimhon, Shaune Pascal, MD  dexamethasone (DECADRON) 4 MG tablet Take 1 tab 3 x day - 3 days, then 2 x day - 3 days, then 1 tab daily 10/30/20   [name], MD  diphenhydrAMINE (BENADRYL) 25 MG tablet Take 25 mg by mouth as needed.    [provider]  fluticasone (FLONASE) 50 MCG/ACT nasal spray Place 1 spray into both nostrils daily as needed for allergies. 03/14/20   [name], MD  HYDROcodone-acetaminophen (NORCO/VICODIN) 5-325 MG tablet 1 tablet as needed    [provider]  insulin NPH-regular Human (70-30) 100  UNIT/ML injection Inject 45 Units into the skin daily with breakfast. 25units in the evening    [provider]  Iron-FA-B Cmp-C-Biot-Probiotic (FUSION PLUS) CAPS Take 1 capsule by mouth daily. Patient not taking: Reported on 09/13/2020 07/16/20   Magda Bernheim, NP  loratadine (CLARITIN) 10 MG tablet Take 10 mg by mouth daily as needed for allergies.     [provider]  metolazone (ZAROXOLYN) 2.5 MG tablet Take 2.5 mg by mouth daily. Only as  needed for CHF related weight gain    [provider]  metolazone (ZAROXOLYN) 5 MG tablet 1 tablet as needed    [provider]  Multiple Vitamins-Minerals (PRESERVISION AREDS 2) CAPS Take 1 capsule by mouth 2 (two) times daily.     [provider]  polyvinyl alcohol (LIQUIFILM TEARS) 1.4 % ophthalmic solution Place 1 drop into both eyes daily as needed for dry eyes.    [provider]  potassium chloride SA (KLOR-CON) 20 MEQ tablet TAKE 2 TABLETS BY MOUTH 3  TIMES DAILY FOR POTASSIUM 03/16/20   Liane Comber, NP  sertraline (ZOLOFT) 100 MG tablet TAKE 1 TABLET BY MOUTH  DAILY FOR MOOD 03/18/20   Bensimhon, Shaune Pascal, MD  torsemide (DEMADEX) 100 MG tablet Take 150 mg by mouth daily. 1.5 tablet    [provider]  trimethoprim-polymyxin b (POLYTRIM) ophthalmic solution Only when [name] has eye injections 07/03/20   [provider]  warfarin (COUMADIN) 10 MG tablet Take 10 mg by mouth 2 (two) times a week.    [provider]  warfarin (COUMADIN) 5 MG tablet Take    1.5 tablets /day      or as directed Patient taking differently: Take 1.5 tablets by mouth x 5 days weekly 12/11/19   [name], MD  zinc gluconate 50 MG tablet Take 50 mg by mouth daily.    [provider]    Allergies    Sunflower oil, Horse-derived products, Other, Immune globulin, Tetanus toxoids, and Tetanus toxoid  Review of Systems   Review of Systems  Constitutional:  Negative for chills and fever.  HENT:  Negative for ear pain and sore throat.   Eyes:  Negative for pain and visual disturbance.  Respiratory:  Negative for cough and shortness of breath.   Cardiovascular:  Negative for chest pain and palpitations.  Gastrointestinal:  Negative for abdominal pain, constipation, diarrhea, nausea and vomiting.  Genitourinary:  Negative for dysuria and hematuria.  Musculoskeletal:  Positive for arthralgias and back pain.  Skin:  Negative for color change and  rash.  Neurological:  Negative for weakness and light-headedness.  All other systems reviewed and are negative.  Physical Exam Updated Vital Signs BP (!) 146/80   Pulse (!) 58   Temp 98.2 F (36.8 C)   Resp 18   Ht 5' 11"  (1.803 m)   Wt 81.2 kg   SpO2 99%   BMI 24.97 kg/m   Physical Exam Constitutional:      General: [name] is not in acute distress. HENT:     Head: Normocephalic and atraumatic.  Eyes:     Conjunctiva/sclera: Conjunctivae normal.     Pupils: Pupils are equal, round, and reactive to light.  Cardiovascular:     Rate and Rhythm: Normal rate and regular rhythm.  Pulmonary:     Effort: Pulmonary effort is normal. No respiratory distress.  Abdominal:     General: There is no distension.     Tenderness: There is no abdominal tenderness.  There is no guarding.  Musculoskeletal:     Comments: Mild upper L spine midline and paraspinal tenderness Pain worse with sitting upright in bed, and with bilateral straight leg test  Skin:    General: Skin is warm and dry.  Neurological:     General: No focal deficit present.     Mental Status: [name] is alert and oriented to person, place, and time. Mental status is at baseline.     Sensory: No sensory deficit.     Motor: No weakness.  Psychiatric:        Mood and Affect: Mood normal.        Behavior: Behavior normal.    ED Results / Procedures / Treatments   Labs (all labs ordered are listed, but only abnormal results are displayed) Labs Reviewed  URINALYSIS, ROUTINE W REFLEX MICROSCOPIC - Abnormal; Notable for the following components:      Result Value   Color, Urine COLORLESS (*)    Glucose, UA 500 (*)    All other components within normal limits    EKG None  Radiology CT L-SPINE NO CHARGE  Result Date: 11/02/2020 CLINICAL DATA:  Back pain for 1 week.  No known injury. EXAM: CT LUMBAR SPINE WITHOUT CONTRAST TECHNIQUE: Multidetector CT imaging of the lumbar spine was performed without intravenous contrast  administration. Multiplanar CT image reconstructions were also generated. COMPARISON:  08/08/2014 FINDINGS: Segmentation: 5 lumbar type vertebrae. Alignment: Grade 1 anterolisthesis of L4 on L5 secondary to facet disease. Vertebrae: L2 vertebral body compression fracture with approximately 20% central height loss. Generalized osteopenia. No discitis or osteomyelitis. No aggressive osseous lesion. Paraspinal and other soft tissues: Abdominal aortic atherosclerosis. Other: Mild osteoarthritis of bilateral SI joints. Disc levels: Disc spaces: Degenerative disease with mild disc height loss at L1-2. T12-L1: No disc protrusion, foraminal stenosis or central canal stenosis. L1-L2: No disc protrusion, foraminal stenosis or central canal stenosis. L2-L3: Mild broad-based disc bulge. No foraminal or central canal stenosis. L3-L4: Mild broad-based disc bulge. Mild bilateral facet arthropathy. No foraminal or central canal stenosis. L4-L5: Mild broad-based disc bulge. Severe bilateral facet arthropathy. Severe spinal stenosis. No foraminal stenosis. L5-S1: Mild broad-based disc bulge. Mild bilateral facet arthropathy. No foraminal or central canal stenosis. IMPRESSION: 1. Acute L2 vertebral body compression fracture with approximately 20% height loss. Electronically Signed   By: Kathreen Devoid M.D.   On: 11/02/2020 17:13   CT RENAL STONE STUDY  Result Date: 11/02/2020 CLINICAL DATA:  80 year old male with acute abdominal and flank pain. EXAM: CT ABDOMEN AND PELVIS WITHOUT CONTRAST TECHNIQUE: Multidetector CT imaging of the abdomen and pelvis was performed following the standard protocol without IV contrast. COMPARISON:  08/08/2014 CT and prior studies FINDINGS: Please note that parenchymal/vascular abnormalities may be missed is intravenous contrast was not administered. Lower chest: A trace RIGHT pleural effusion is noted. Bibasilar scarring again noted. Hepatobiliary: The liver and gallbladder are unremarkable. No  biliary dilatation. 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 There is no evidence of hydronephrosis or urinary calculi. Stomach/Bowel: Stomach is within normal limits. No evidence of bowel wall thickening, distention, or inflammatory changes. Vascular/Lymphatic: Aortic atherosclerosis. No enlarged abdominal or pelvic lymph nodes. Reproductive: Prostate is unremarkable. Other: A trace amount of free fluid within the pelvis is identified. No focal collection or pneumoperitoneum. Musculoskeletal: 35% SUPERIOR endplate compression fracture of L2 is age indeterminate but new since  2016. Grade 1 anterolisthesis of L4 on 5 is again noted. IMPRESSION: 1. 35% SUPERIOR endplate compression fracture of L2, age indeterminate but new since 2016. 2. Trace amount of nonspecific free pelvic fluid and trace RIGHT pleural effusion. 3. Aortic Atherosclerosis (ICD10-I70.0). Electronically Signed   By: Margarette Canada M.D.   On: 11/02/2020 17:12    Procedures Procedures   Medications Ordered in ED Medications  oxyCODONE-acetaminophen (PERCOCET/ROXICET) 5-325 MG per tablet 1 tablet (1 tablet Oral Given 11/02/20 1618)    ED Course  I have reviewed the triage vital signs and the nursing notes.  Pertinent labs & imaging results that were available during my care of the patient were reviewed by me and considered in my medical decision making (see chart for details).  Differential diagnosis include fx vs UTI vs lumbar strain vs kidney stone vs other  CT L spine and CT renal study reviewed  and interpreted, showing L1 compression fx.  No other emergent findings on imaging.    Patient neurovascularly intact, able to ambulate easily.  [name] achieved some pain relief with the oxycodone here.  Can prescribe several tablets at home in addition to his tylenol.  Patient was  a former Software engineer; we discussed the risks of dizziness and sedation and constipation while on opioids, and [name] understands these.  The patient did not want to wait for a TLSO brace to be fitted.  I advised that [name] follow up in the spine clinic in about 2 weeks for reassessment.  I expect likely conservative management for this injury.  [name] verbalized understanding.  All questions were answered.  His wife is coming to pick him up and take him home.  Clinical Course as of 11/03/20 0957  Sat Nov 02, 2020  1719 IMPRESSION: 1. Acute L2 vertebral body compression fracture with approximately 20% height loss. [MT]    Clinical Course User Index [MT] Meldon Hanzlik, Carola Rhine, MD    Final Clinical Impression(s) / ED Diagnoses Final diagnoses:  Closed compression fracture of body of L1 vertebra [facility])    Rx / DC Orders ED Discharge Orders          Ordered    acetaminophen (TYLENOL) 325 MG tablet  Every 6 hours PRN        11/02/20 1800    oxyCODONE (ROXICODONE) 5 MG immediate release tablet  Every 6 hours PRN        11/02/20 1800    senna-docusate (SENOKOT-S) 8.6-50 MG tablet  Daily PRN        11/02/20 1800             Wyvonnia Dusky, MD 11/03/20 [id number]

## 2020-11-02 NOTE — ED Notes (Signed)
Triage delayed, patient in restroom

## 2020-11-02 NOTE — ED Triage Notes (Signed)
Pt arrives pov with c/o bilateral lower back pain. Pt reports pain began after putting in mailbox post. Pt denies dysuria. Saw pcp on Thursday.Pt took 1 gram tylenol at 1300 with little relief. Pt also endorses flexeril and decadron

## 2020-11-06 ENCOUNTER — Encounter (INDEPENDENT_AMBULATORY_CARE_PROVIDER_SITE_OTHER): Payer: Medicare Other | Admitting: Ophthalmology

## 2020-11-07 ENCOUNTER — Emergency Department (HOSPITAL_COMMUNITY)
Admission: EM | Admit: 2020-11-07 | Discharge: 2020-11-07 | Disposition: A | Discharge status: Home / Self Care | Payer: Medicare Other | Attending: Emergency Medicine | Admitting: Emergency Medicine

## 2020-11-07 ENCOUNTER — Other Ambulatory Visit: Payer: Self-pay

## 2020-11-07 ENCOUNTER — Emergency Department (HOSPITAL_COMMUNITY): Payer: Medicare Other

## 2020-11-07 DIAGNOSIS — I4891 Unspecified atrial fibrillation: Secondary | ICD-10-CM | POA: Insufficient documentation

## 2020-11-07 DIAGNOSIS — Z7901 Long term (current) use of anticoagulants: Secondary | ICD-10-CM | POA: Diagnosis not present

## 2020-11-07 DIAGNOSIS — N183 Chronic kidney disease, stage 3 unspecified: Secondary | ICD-10-CM | POA: Insufficient documentation

## 2020-11-07 DIAGNOSIS — Z85828 Personal history of other malignant neoplasm of skin: Secondary | ICD-10-CM | POA: Diagnosis not present

## 2020-11-07 DIAGNOSIS — Z951 Presence of aortocoronary bypass graft: Secondary | ICD-10-CM | POA: Insufficient documentation

## 2020-11-07 DIAGNOSIS — I517 Cardiomegaly: Secondary | ICD-10-CM | POA: Diagnosis not present

## 2020-11-07 DIAGNOSIS — Z743 Need for continuous supervision: Secondary | ICD-10-CM | POA: Diagnosis not present

## 2020-11-07 DIAGNOSIS — I2510 Atherosclerotic heart disease of native coronary artery without angina pectoris: Secondary | ICD-10-CM | POA: Insufficient documentation

## 2020-11-07 DIAGNOSIS — Z794 Long term (current) use of insulin: Secondary | ICD-10-CM | POA: Diagnosis not present

## 2020-11-07 DIAGNOSIS — R404 Transient alteration of awareness: Secondary | ICD-10-CM | POA: Diagnosis not present

## 2020-11-07 DIAGNOSIS — Z87891 Personal history of nicotine dependence: Secondary | ICD-10-CM | POA: Diagnosis not present

## 2020-11-07 DIAGNOSIS — R531 Weakness: Secondary | ICD-10-CM | POA: Diagnosis not present

## 2020-11-07 DIAGNOSIS — R6889 Other general symptoms and signs: Secondary | ICD-10-CM | POA: Diagnosis not present

## 2020-11-07 DIAGNOSIS — I130 Hypertensive heart and chronic kidney disease with heart failure and stage 1 through stage 4 chronic kidney disease, or unspecified chronic kidney disease: Secondary | ICD-10-CM | POA: Diagnosis not present

## 2020-11-07 DIAGNOSIS — E11649 Type 2 diabetes mellitus with hypoglycemia without coma: Secondary | ICD-10-CM | POA: Insufficient documentation

## 2020-11-07 DIAGNOSIS — Z79899 Other long term (current) drug therapy: Secondary | ICD-10-CM | POA: Diagnosis not present

## 2020-11-07 DIAGNOSIS — I5032 Chronic diastolic (congestive) heart failure: Secondary | ICD-10-CM | POA: Diagnosis not present

## 2020-11-07 DIAGNOSIS — I10 Essential (primary) hypertension: Secondary | ICD-10-CM | POA: Diagnosis not present

## 2020-11-07 DIAGNOSIS — E161 Other hypoglycemia: Secondary | ICD-10-CM | POA: Diagnosis not present

## 2020-11-07 DIAGNOSIS — E1122 Type 2 diabetes mellitus with diabetic chronic kidney disease: Secondary | ICD-10-CM | POA: Insufficient documentation

## 2020-11-07 DIAGNOSIS — E162 Hypoglycemia, unspecified: Secondary | ICD-10-CM | POA: Diagnosis not present

## 2020-11-07 LAB — COMPREHENSIVE METABOLIC PANEL
ALT: 26 U/L (ref 0–44)
AST: 19 U/L (ref 15–41)
Albumin: 3.4 g/dL — ABNORMAL LOW (ref 3.5–5.0)
Alkaline Phosphatase: 159 U/L — ABNORMAL HIGH (ref 38–126)
Anion gap: 11 (ref 5–15)
BUN: 33 mg/dL — ABNORMAL HIGH (ref 8–23)
CO2: 26 mmol/L (ref 22–32)
Calcium: 8.5 mg/dL — ABNORMAL LOW (ref 8.9–10.3)
Chloride: 101 mmol/L (ref 98–111)
Creatinine, Ser: 1.76 mg/dL — ABNORMAL HIGH (ref 0.61–1.24)
GFR, Estimated: 38 mL/min — ABNORMAL LOW (ref >60)
Glucose, Bld: 60 mg/dL — ABNORMAL LOW (ref 70–99)
Potassium: 3.5 mmol/L (ref 3.5–5.1)
Sodium: 138 mmol/L (ref 135–145)
Total Bilirubin: 1.1 mg/dL (ref 0.3–1.2)
Total Protein: 6.2 g/dL — ABNORMAL LOW (ref 6.5–8.1)

## 2020-11-07 LAB — CBC WITH DIFFERENTIAL/PLATELET
Abs Immature Granulocytes: 0.15 10*3/uL — ABNORMAL HIGH (ref 0.00–0.07)
Basophils Absolute: 0 10*3/uL (ref 0.0–0.1)
Basophils Relative: 0 %
Eosinophils Absolute: 0 10*3/uL (ref 0.0–0.5)
Eosinophils Relative: 0 %
HCT: 41.1 % (ref 39.0–52.0)
Hemoglobin: 13.3 g/dL (ref 13.0–17.0)
Immature Granulocytes: 1 %
Lymphocytes Relative: 3 %
Lymphs Abs: 0.4 10*3/uL — ABNORMAL LOW (ref 0.7–4.0)
MCH: 30.8 pg (ref 26.0–34.0)
MCHC: 32.4 g/dL (ref 30.0–36.0)
MCV: 95.1 fL (ref 80.0–100.0)
Monocytes Absolute: 1 10*3/uL (ref 0.1–1.0)
Monocytes Relative: 7 %
Neutro Abs: 12 10*3/uL — ABNORMAL HIGH (ref 1.7–7.7)
Neutrophils Relative %: 89 %
Platelets: 162 10*3/uL (ref 150–400)
RBC: 4.32 MIL/uL (ref 4.22–5.81)
RDW: 16.6 % — ABNORMAL HIGH (ref 11.5–15.5)
WBC: 13.6 10*3/uL — ABNORMAL HIGH (ref 4.0–10.5)
nRBC: 0 % (ref 0.0–0.2)

## 2020-11-07 LAB — I-STAT CHEM 8, ED
BUN: 37 mg/dL — ABNORMAL HIGH (ref 8–23)
Calcium, Ion: 1.02 mmol/L — ABNORMAL LOW (ref 1.15–1.40)
Chloride: 99 mmol/L (ref 98–111)
Creatinine, Ser: 1.9 mg/dL — ABNORMAL HIGH (ref 0.61–1.24)
Glucose, Bld: 66 mg/dL — ABNORMAL LOW (ref 70–99)
HCT: 41 % (ref 39.0–52.0)
Hemoglobin: 13.9 g/dL (ref 13.0–17.0)
Potassium: 3.5 mmol/L (ref 3.5–5.1)
Sodium: 140 mmol/L (ref 135–145)
TCO2: 30 mmol/L (ref 22–32)

## 2020-11-07 LAB — PROTIME-INR
INR: 2.4 — ABNORMAL HIGH (ref 0.8–1.2)
Prothrombin Time: 26 seconds — ABNORMAL HIGH (ref 11.4–15.2)

## 2020-11-07 LAB — URINALYSIS, ROUTINE W REFLEX MICROSCOPIC
Bilirubin Urine: NEGATIVE
Glucose, UA: 150 mg/dL — AB
Hgb urine dipstick: NEGATIVE
Ketones, ur: NEGATIVE mg/dL
Leukocytes,Ua: NEGATIVE
Nitrite: NEGATIVE
Protein, ur: NEGATIVE mg/dL
Specific Gravity, Urine: 1.011 (ref 1.005–1.030)
pH: 5 (ref 5.0–8.0)

## 2020-11-07 LAB — CBG MONITORING, ED
Glucose-Capillary: 99 mg/dL (ref 70–99)
Glucose-Capillary: 119 mg/dL — ABNORMAL HIGH (ref 70–99)
Glucose-Capillary: 110 mg/dL — ABNORMAL HIGH (ref 70–99)
Glucose-Capillary: 83 mg/dL (ref 70–99)

## 2020-11-07 MED ORDER — ACETAMINOPHEN 500 MG PO TABS
1000.0000 mg | ORAL_TABLET | Freq: Once | ORAL | Status: AC
  Administered 2020-11-07: 1000 mg via ORAL
  Filled 2020-11-07: qty 2

## 2020-11-07 NOTE — Progress Notes (Signed)
Inpatient Diabetes Program Recommendations  AACE/ADA: New Consensus Statement on Inpatient Glycemic Control (2015)  Target Ranges:  Prepandial:   less than 140 mg/dL      Peak postprandial:   less than 180 mg/dL (1-2 hours)      Critically ill patients:  140 - 180 mg/dL   Lab Results  Component Value Date   GLUCAP 110 (H) 11/07/2020   HGBA1C 6.3 (H) 10/23/2020    Review of Glycemic Control Results for [name], [name] (MRN [id number]) as of 11/07/2020 09:37  Ref. Range 11/07/2020 03:42 11/07/2020 05:23 11/07/2020 06:35 11/07/2020 07:22  Glucose-Capillary Latest Ref Range: 70 - 99 mg/dL 99 119 (H) 83 110 (H)   Diabetes history: DM2 Outpatient Diabetes medications: 70/30 45 units am, 25 units pm Current orders for Inpatient glycemic control: None   Inpatient Diabetes Program Recommendations:   Patient admitted with hypoglycemia. Patient was admitted with hypoglycemia 06/09/20. Patient will need reduced home insulin doses on discharge to prevent hypoglycemia. A1c 6.3. Will follow during hospitalization.  Thank you, Nani Gasser. Curtis Cain, RN, MSN, CDE  Diabetes Coordinator Inpatient Glycemic Control Team Team Pager [phone] (8am-5pm) 11/07/2020 9:39 AM

## 2020-11-07 NOTE — ED Triage Notes (Signed)
Pt via EMS from home-pt's wife called EMS for lift assist as he was half off of the bed in the middle of the night. Pt found by EMS with GCS of 9 and CBG 49. Pt given D10 with improvement in CBG to 127 and GCS 15.

## 2020-11-07 NOTE — ED Provider Notes (Signed)
[facility] EMERGENCY DEPARTMENT Provider Note   CSN: [id number] Arrival date & time: 11/07/20  0345     History Chief Complaint  Patient presents with   Hypoglycemia    Cordie Buening. is a 80 y.o. male.  The history is provided by the patient, the EMS personnel and medical records.  Hypoglycemia Deniro Laymon. is a 80 y.o. male who presents to the Emergency Department complaining of hypoglycemia. He presents to the emergency department by EMS for evaluation of hypoglycemic episode. EMS was called to the home for lift assist because he was part and in part out of the bed and needed assistance to get back in the bed. He lives with his wife. On their arrival he was a GCS of nine and blood sugar on assessment was 43. He was treated with 500 mL of fluid and the blood sugar did not improve. He was then treated with 20 g of D10 with improvement in blood sugar to 120s. His mental status did improve with improvement in the blood sugar. No head injury. Patient reports that about one week ago he heard his low back when he was attempting to lift something. He has been taking oxycodone for pain but has experienced persistent low back pain since that time. No numbness, weakness. Pain is worse when he ranges his right leg. No fevers, chest pain, abdominal pain, nausea, vomiting. No recent medication changes.    Past Medical History:  Diagnosis Date   Adrenal adenoma    Arthritis    Atypical atrial flutter ([place]) 8/15, 10/15   a. DCCV 08/2013. b. s/p RFA 10/2013.   Basal cell carcinoma    CAD (coronary artery disease)    a. 04/2013 CABG x 2: LIMA to LAD, SVG to RI, EVH via R thigh.   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 s/p MAZE 04/2013 in setting of CABG. b. Amio stopped in 10/2013 after flutter ablation.   PFO (patent foramen ovale)    a. Small PFO by TEE 10/2013.   Pleural effusion, left    a. s/p thoracentesis 05/2013.   Respiratory failure ([place])    a. Hypoxia 10/2013 - required supp O2 as inpatient, did not require it at discharge.   S/P Maze operation for atrial fibrillation    a. 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   Chronic restrictive lung disease 09/03/2014   Pulmonary hypertension ([place]) 08/23/2014   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[place]) 01/30/2014   OSA on CPAP 01/30/2014   PFO (patent foramen ovale) 10/30/2013   CAD (coronary artery disease) 10/29/2013   Essential hypertension 10/25/2013   Chronic anticoagulation (INR goal 2.0-2.5) 08/22/2013   Atrial flutter ([place]) 08/21/2013   Vitamin D deficiency 05/19/2013   Type 2 diabetes mellitus with stage 4 chronic kidney disease,  with long-term current use of insulin (HCC)    GERD    DJD (degenerative joint disease)    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 04/05/2013   S/P Maze operation for atrial fibrillation 04/05/2013   Chronic diastolic congestive heart failure ([place])    Hypertensive cardiomyopathy ([place])    Hyperlipidemia associated with type 2 diabetes mellitus ([place]) 09/03/2008   PAF (paroxysmal atrial fibrillation) ([place]) 09/03/2008   Diverticulosis of large intestine 09/03/2008   Colonic Polyps 09/03/2008    Past Surgical History:  Procedure Laterality Date   APPENDECTOMY  03/05/2017   laproscopic   ATRIAL FIBRILLATION ABLATION N/A 10/26/2013   Procedure: ATRIAL FIBRILLATION ABLATION;  Surgeon: Coralyn Mark, MD;  Location: [facility] CATH LAB;  Service: Cardiovascular;  Laterality: N/A;   BASAL CELL CARCINOMA EXCISION     x3 on face   BIOPSY  06/12/2020   Procedure: BIOPSY;  Surgeon: Jerene Bears, MD;  Location: [facility] ENDOSCOPY;  Service: Gastroenterology;;   CARDIAC CATHETERIZATION     myocardial bridge but no cad   CARDIOVERSION N/A 08/23/2013   Procedure: CARDIOVERSION;  Surgeon: Sanda Klein, MD;  Location: [place];  Service: Cardiovascular;  Laterality: N/A;   CARPOMETACARPEL SUSPENSION PLASTY Left 02/14/2014   Procedure: CARPOMETACARPEL ([place]) SUSPENSIONPLASTY THUMB  WITH  ABDUCTOR POLLICIS LONGUS TRANSFER AND STENOSING TENOSYNOVITIS RELEASE LEFT WRIST;  Surgeon: Charlotte Crumb, MD;  Location: [place];  Service: Orthopedics;  Laterality: Left;   CATARACT EXTRACTION Bilateral    COLONOSCOPY WITH PROPOFOL N/A 01/26/2017   Procedure: COLONOSCOPY WITH PROPOFOL;  Surgeon: Doran Stabler, MD;  Location: [facility] ENDOS[facility];  Service: Gastroenterology;  Laterality: N/A;   CORONARY ARTERY BYPASS GRAFT N/A 04/05/2013   Procedure: CORONARY ARTERY BYPASS GRAFTING (CABG) TIMES TWO USING LEFT INTERNAL MAMMARY ARTERY AND RIGHT SAPHENOUS LEG VEIN HARVESTED ENDOSCOPICALLY;  Surgeon: Rexene Alberts,  MD;  Location: [facility];  Service: Open Heart Surgery;  Laterality: N/A;   ENTEROSCOPY N/A 06/12/2020   Procedure: ENTEROSCOPY;  Surgeon: Jerene Bears, MD;  Location: [facility] ENDOS[facility];  Service: Gastroenterology;  Laterality: N/A;   ESOPHAGOGASTRODUODENOSCOPY (EGD) WITH PROPOFOL N/A 01/26/2017   Procedure: ESOPHAGOGASTRODUODENOSCOPY (EGD) WITH PROPOFOL;  Surgeon: Doran Stabler, MD;  Location: [facility];  Service: Gastroenterology;  Laterality: N/A;   EYE SURGERY Bilateral    Cat Sx   EYE SURGERY Right    RD repair - SB   GREAT TOE ARTHRODESIS, INTERPHALANGEAL JOINT     Right foot   HOT HEMOSTASIS N/A 06/12/2020   Procedure: HOT HEMOSTASIS (ARGON PLASMA COAGULATION/BICAP);  Surgeon: Jerene Bears, MD;  Location: [facility];  Service: Gastroenterology;  Laterality: N/A;   INTRAOPERATIVE TRANSESOPHAGEAL ECHOCARDIOGRAM N/A 04/05/2013   Procedure: INTRAOPERATIVE TRANSESOPHAGEAL ECHOCARDIOGRAM;  Surgeon: Valentina Gu  Roxy Manns, MD;  Location: [facility];  Service: Open Heart Surgery;  Laterality: N/A;   LAPAROSCOPIC APPENDECTOMY N/A 03/05/2017   Procedure: APPENDECTOMY LAPAROSCOPIC;  Surgeon: Judeth Horn, MD;  Location: [facility];  Service: General;  Laterality: N/A;   LEFT HEART CATHETERIZATION WITH CORONARY ANGIOGRAM N/A 03/07/2013   Procedure: LEFT HEART CATHETERIZATION WITH CORONARY ANGIOGRAM;  Surgeon: Burnell Blanks, MD;  Location[facility] C[facility] LAB;  Service: Cardiovascular;  Laterality: N/A;   MAZE N/A 04/05/2013   Procedure: MAZE;  Surgeon: Rexene Alberts, MD;  Location: [place];  Service: Open Heart Surgery;  Laterality: N/A;   Polinydal cyst     Removed   POLYPECTOMY     Retina repair-right     RETINAL DETACHMENT SURGERY Right    RD Repair - SB   RIGHT HEART CATH N/A 07/06/2019   Procedure: RIGHT HEART CATH;  Surgeon: Jolaine Artist, MD;  Location: [place] CV LAB;  Service: Cardiovascular;  Laterality: N/A;   RIGHT HEART CATHETERIZATION N/A 05/03/2014   Procedure: RIGHT HEART CATH;  Surgeon: Jolaine Artist, MD;  Location: [facility] CATH LAB;  Service: Cardiovascular;  Laterality: N/A;   TEE WITHOUT CARDIOVERSION N/A 08/23/2013   Procedure: TRANSESOPHAGEAL ECHOCARDIOGRAM (TEE);  Surgeon: Sanda Klein, MD;  Location: [facility] ENDOSCOPY;  Service: Cardiovascular;  Laterality: N/A;   TEE WITHOUT CARDIOVERSION N/A 10/26/2013   Procedure: TRANSESOPHAGEAL ECHOCARDIOGRAM (TEE);  Surgeon: Sueanne Margarita, MD;  Location[facility];  Service: Cardiovascular;  Laterality: N/A;   TEE WITHOUT CARDIOVERSION N/A 06/05/2014   Procedure: TRANSESOPHAGEAL ECHOCARDIOGRAM (TEE);  Surgeon: Thayer Headings, MD;  Location: [facility] ENDOSCOPY;  Service: Cardiovascular;  Laterality: N/A;   TRAPEZIUM RESECTION         Family History  Problem Relation Age of Onset   Dementia Father    Colon cancer Mother        Family History/Uncle    Colon polyps Mother        Family History   Atrial fibrillation Mother    Hypertension Mother    Colon polyps Sister        Family history   Diabetes Maternal Uncle    Stroke Paternal Uncle     Social History   Tobacco Use   Smoking status: Former    Packs/day: 4.00    Years: 25.00    Pack years: 100.00    Types: Cigarettes    Quit date: 01/05/1981    Years since quitting: 39.8   Smokeless tobacco: Never  Vaping Use   Vaping Use: Never used  Substance Use Topics   Alcohol use: Yes    Alcohol/week: 1.0 standard drink    Types: 1 Shots of liquor per week    Comment: 1-5 drinks per week   Drug use: No    Home Medications Prior to Admission medications   Medication Sig Start Date End Date Taking? Authorizing Provider  ACCU-CHEK AVIVA PLUS test strip CHECK BLOOD GLUCOSE 3 TIMES DAILY 11/26/19   [name], MD  acetaminophen (TYLENOL) 325 MG tablet Take 2 tablets (650 mg total) by mouth every 6 (six) hours as needed for up to 30 doses for moderate pain or mild pain. 11/02/20   Wyvonnia Dusky, MD  acetaminophen (TYLENOL) 500 MG tablet Take 1,000 mg by mouth every 6  (six) hours as needed for moderate pain or headache.     [provider]  allopurinol (ZYLOPRIM) 300 MG tablet Take 300 mg by mouth daily.    [provider]  ALPRAZolam (XANAX) 1 MG tablet Take 0.5 tablets (0.5 mg total) by mouth at bedtime as needed for sleep. 03/23/16   [name], MD  Alum Hydroxide-Mag Carbonate (GAVISCON PO) Take 4 tablets by mouth daily as needed (acid reflux).     [provider]  Ascorbic Acid (VITAMIN C) 1000 MG tablet Take 1,000 mg by mouth daily.    [provider]  atorvastatin (LIPITOR) 20 MG tablet Take  1 tablet  3 x /week  for Cholesterol 04/21/20   [name], MD  azelastine (ASTELIN) 0.1 % nasal spray Place 2 sprays into both nostrils 2 (two) times daily as needed for rhinitis or allergies. 04/30/20   [name], MD  B Complex Vitamins (VITAMIN B COMPLEX PO) Take 1 tablet by mouth at bedtime.     [provider]  bacitracin 500 UNIT/GM ointment Apply 1 application topically daily as needed for wound care.    [provider]  Blood Glucose Monitoring Suppl (ACCU-CHEK AVIVA PLUS) w/Device KIT Check blood sugar 1 time  daily 07/12/15   [name], MD  Blood Glucose Monitoring Suppl DEVI Test blood sugar up to three times a day or as directed. 05/24/17   Liane Comber, NP  carvedilol (COREG) 6.25 MG tablet TAKE ONE-HALF TABLET BY  MOUTH IN THE MORNING AND 1  TABLET BY MOUTH IN THE  EVENING 07/15/20   Bensimhon, Shaune Pascal, MD  Cholecalciferol (VITAMIN D PO) Take 5,000 Units by mouth daily.     [provider]  colchicine 0.6 MG tablet Take 0.6 mg by mouth every other day.    [provider]  Continuous Blood Gluc Receiver (DEXCOM G6 RECEIVER) DEVI Use to check blood sugar multiple times a day for Hypoglycemia and Hyperglycemia 08/19/20   [name], MD  Continuous Blood Gluc Sensor (DEXCOM G6 SENSOR) MISC Use to check blood sugar multiple times a day for Hypoglycemia and  Hyperglycemia 08/19/20   [name], MD  Continuous Blood Gluc Transmit (DEXCOM G6 TRANSMITTER) MISC Use to check blood sugar multiple times a day for Hypoglycemia and Hyperglycemia 08/19/20   [name], MD  cyclobenzaprine (FLEXERIL) 10 MG tablet Take 1/2 to 1 tablet 3 x /day as needed for Muscle Spasm 10/30/20   [name], MD  dapagliflozin propanediol (FARXIGA) 5 MG TABS tablet Take 1 tablet (5 mg total) by mouth daily before breakfast. 07/04/20   Bensimhon, Shaune Pascal, MD  dexamethasone (DECADRON) 4 MG tablet Take 1 tab 3 x day - 3 days, then 2 x day - 3 days, then 1 tab daily 10/30/20   [name], MD  diphenhydrAMINE (BENADRYL) 25 MG tablet Take 25 mg by mouth as needed.    [provider]  fluticasone (FLONASE) 50 MCG/ACT nasal spray Place 1 spray into both nostrils daily as needed for allergies. 03/14/20   [name], MD  HYDROcodone-acetaminophen (NORCO/VICODIN) 5-325 MG tablet 1 tablet as needed    [provider]  insulin NPH-regular Human (70-30) 100 UNIT/ML injection Inject 45 Units into the skin daily with breakfast. 25units in the evening    [provider]  Iron-FA-B Cmp-C-Biot-Probiotic (FUSION PLUS) CAPS Take 1 capsule by mouth daily. Patient not taking: Reported on 09/13/2020 07/16/20   Magda Bernheim, NP  loratadine (CLARITIN) 10 MG tablet Take 10 mg by mouth daily as needed for allergies.     [provider]  metolazone (ZAROXOLYN) 2.5 MG tablet Take 2.5 mg by mouth daily. Only as needed for CHF related weight  gain    [provider]  metolazone (ZAROXOLYN) 5 MG tablet 1 tablet as needed    [provider]  Multiple Vitamins-Minerals (PRESERVISION AREDS 2) CAPS Take 1 capsule by mouth 2 (two) times daily.     [provider]  oxyCODONE (ROXICODONE) 5 MG immediate release tablet Take 1 tablet (5 mg total) by mouth every 6 (six) hours as needed for up to 10 doses for severe pain. 11/02/20   Wyvonnia Dusky, MD  polyvinyl alcohol (LIQUIFILM TEARS) 1.4 % ophthalmic solution Place 1 drop into both eyes daily as needed for dry eyes.    [provider]  potassium chloride SA (KLOR-CON) 20 MEQ tablet TAKE 2 TABLETS BY MOUTH 3  TIMES DAILY FOR POTASSIUM 03/16/20   Liane Comber, NP  senna-docusate (SENOKOT-S) 8.6-50 MG tablet Take 1 tablet by mouth daily as needed for up to 20 doses for mild constipation. Take with roxicodone to prevent constipation 11/02/20   Wyvonnia Dusky, MD  sertraline (ZOLOFT) 100 MG tablet TAKE 1 TABLET BY MOUTH  DAILY FOR MOOD 03/18/20   Bensimhon, Shaune Pascal, MD  torsemide (DEMADEX) 100 MG tablet Take 150 mg by mouth daily. 1.5 tablet    [provider]  trimethoprim-polymyxin b (POLYTRIM) ophthalmic solution Only when he has eye injections 07/03/20   [provider]  warfarin (COUMADIN) 10 MG tablet Take 10 mg by mouth 2 (two) times a week.    [provider]  warfarin (COUMADIN) 5 MG tablet Take    1.5 tablets /day      or as directed Patient taking differently: Take 1.5 tablets by mouth x 5 days weekly 12/11/19   [name], MD  zinc gluconate 50 MG tablet Take 50 mg by mouth daily.    [provider]    Allergies    Sunflower oil, Horse-derived products, Other, Immune globulin, Tetanus toxoids, and Tetanus toxoid  Review of Systems   Review of Systems  All other systems reviewed and are negative.  Physical Exam Updated Vital Signs BP 111/63   Pulse (!) 57   Temp 97.7 F (36.5 C) (Oral)   Resp 17   SpO2 93%   Physical Exam Vitals and nursing note reviewed.  Constitutional:      Appearance: He is well-developed.  HENT:     Head: Normocephalic and atraumatic.  Cardiovascular:     Rate and Rhythm: Normal rate and regular rhythm.  Pulmonary:     Effort: Pulmonary effort is normal. No respiratory distress.  Abdominal:     Palpations: Abdomen is soft.     Tenderness: There is no abdominal tenderness.  There is no guarding or rebound.     Comments: Ecchymosis to lower abdominal wall without local tenderness  Musculoskeletal:        General: No tenderness.     Comments: 2+ DP pulses.    Skin:    General: Skin is warm and dry.  Neurological:     Mental Status: He is alert and oriented to person, place, and time.     Comments: 5/5 strength in all four extremities with sensation to light touch intact in all four extremities.    Psychiatric:        Behavior: Behavior normal.    ED Results / Procedures / Treatments   Labs (all labs ordered are listed, but only abnormal results are displayed) Labs Reviewed  COMPREHENSIVE METABOLIC PANEL - Abnormal; Notable for the following components:  Result Value   Glucose, Bld 60 (*)    BUN 33 (*)    Creatinine, Ser 1.76 (*)    Calcium 8.5 (*)    Total Protein 6.2 (*)    Albumin 3.4 (*)    Alkaline Phosphatase 159 (*)    GFR, Estimated 38 (*)    All other components within normal limits  CBC WITH DIFFERENTIAL/PLATELET - Abnormal; Notable for the following components:   WBC 13.6 (*)    RDW 16.6 (*)    Neutro Abs 12.0 (*)    Lymphs Abs 0.4 (*)    Abs Immature Granulocytes 0.15 (*)    All other components within normal limits  PROTIME-INR - Abnormal; Notable for the following components:   Prothrombin Time 26.0 (*)    INR 2.4 (*)    All other components within normal limits  URINALYSIS, ROUTINE W REFLEX MICROSCOPIC - Abnormal; Notable for the following components:   Glucose, UA 150 (*)    All other components within normal limits  I-STAT CHEM 8, ED - Abnormal; Notable for the following components:   BUN 37 (*)    Creatinine, Ser 1.90 (*)    Glucose, Bld 66 (*)    Calcium, Ion 1.02 (*)    All other components within normal limits  CBG MONITORING, ED - Abnormal; Notable for the following components:   Glucose-Capillary 119 (*)    All other components within normal limits  CBG MONITORING, ED  CBG MONITORING, ED    EKG EKG  Interpretation  Date/Time:  Thursday November 07 2020 04:08:59 EDT Ventricular Rate:  63 PR Interval:    QRS Duration: 197 QT Interval:  500 QTC Calculation: 512 R Axis:   140 Text Interpretation: Junctional rhythm Right bundle branch block Confirmed by Quintella Reichert [phone]) on 11/07/2020 6:04:45 AM  Radiology DG Chest Port 1 View  Result Date: 11/07/2020 CLINICAL DATA:  Weakness, hypoglycemia EXAM: PORTABLE CHEST 1 VIEW COMPARISON:  06/09/2020 FINDINGS: Cardiomegaly. Chronic pulmonary vascular congestion/cephalization. No focal consolidation. No pleural effusion or pneumothorax. Postsurgical changes related to prior CABG.  Median sternotomy. IMPRESSION: No evidence of acute cardiopulmonary disease. Electronically Signed   By: Julian Hy M.D.   On: 11/07/2020 03:58    Procedures Procedures   Medications Ordered in ED Medications - No data to display  ED Course  I have reviewed the triage vital signs and the nursing notes.  Pertinent labs & imaging results that were available during my care of the patient were reviewed by me and considered in my medical decision making (see chart for details).    MDM Rules/Calculators/A&P                          patient here for evaluation following episode of altered mental status with hypoglycemia. His hypoglycemia resolved upon administration of dextrose by EMS prior to ED arrival. On ED arrival he has no acute complaints and is at his baseline since recent back injury. He has no acute infectious symptoms. BMP with stable renal insufficiency. CBC with mild leukocytosis but no evidence of acute infection. Plan to observe in the emergency department for recurrent hypoglycemia. On reassessment patient's blood sugar is still within normal limits but down trending. Plan to continue to observe in the emergency department for recurrent hypoglycemia. Patient care transferred pending repeat blood sugar.  Final Clinical Impression(s) / ED  Diagnoses Final diagnoses:  None    Rx / DC Orders ED Discharge Orders  None        Quintella Reichert, MD 11/07/20 [phone]

## 2020-11-07 NOTE — ED Notes (Signed)
Left message with Ortho tech for TSLO brace and contacted Mr. Gelles wife with an update on status and anticipated discharge

## 2020-11-07 NOTE — ED Notes (Signed)
Ortho called, TLSO has been ordered and they should be down shortly to apply it

## 2020-11-07 NOTE — Progress Notes (Signed)
Orthopedic Tech Progress Note Patient Details:  [name] 12/06/1940 [id number] Called order for TLSO into [employer] Patient ID: [name], male   DOB: [birth year], 80 y.o.   MRN: [id number]  Chip Boer 11/07/2020, 8:19 AM

## 2020-11-08 ENCOUNTER — Other Ambulatory Visit: Payer: Self-pay | Admitting: Internal Medicine

## 2020-11-08 DIAGNOSIS — M545 Low back pain, unspecified: Secondary | ICD-10-CM

## 2020-11-08 MED ORDER — GABAPENTIN 600 MG PO TABS: ORAL_TABLET | ORAL | 1 refills | Status: AC

## 2020-11-11 ENCOUNTER — Encounter (HOSPITAL_COMMUNITY): Payer: Self-pay | Admitting: Radiology

## 2020-11-11 ENCOUNTER — Emergency Department (HOSPITAL_COMMUNITY): Payer: Medicare Other

## 2020-11-11 ENCOUNTER — Inpatient Hospital Stay (HOSPITAL_COMMUNITY)
Admission: EM | Admit: 2020-11-11 | Discharge: 2020-11-19 | DRG: 516 | Disposition: A | Discharge status: Skilled Nursing Facility | Payer: Medicare Other | Attending: Internal Medicine | Admitting: Internal Medicine

## 2020-11-11 DIAGNOSIS — E1122 Type 2 diabetes mellitus with diabetic chronic kidney disease: Secondary | ICD-10-CM | POA: Diagnosis present

## 2020-11-11 DIAGNOSIS — Z833 Family history of diabetes mellitus: Secondary | ICD-10-CM

## 2020-11-11 DIAGNOSIS — R2689 Other abnormalities of gait and mobility: Secondary | ICD-10-CM | POA: Diagnosis not present

## 2020-11-11 DIAGNOSIS — I484 Atypical atrial flutter: Secondary | ICD-10-CM | POA: Diagnosis not present

## 2020-11-11 DIAGNOSIS — I2721 Secondary pulmonary arterial hypertension: Secondary | ICD-10-CM | POA: Diagnosis present

## 2020-11-11 DIAGNOSIS — Z91018 Allergy to other foods: Secondary | ICD-10-CM

## 2020-11-11 DIAGNOSIS — K219 Gastro-esophageal reflux disease without esophagitis: Secondary | ICD-10-CM | POA: Diagnosis not present

## 2020-11-11 DIAGNOSIS — E785 Hyperlipidemia, unspecified: Secondary | ICD-10-CM | POA: Diagnosis present

## 2020-11-11 DIAGNOSIS — E1169 Type 2 diabetes mellitus with other specified complication: Secondary | ICD-10-CM | POA: Diagnosis present

## 2020-11-11 DIAGNOSIS — I130 Hypertensive heart and chronic kidney disease with heart failure and stage 1 through stage 4 chronic kidney disease, or unspecified chronic kidney disease: Secondary | ICD-10-CM | POA: Diagnosis present

## 2020-11-11 DIAGNOSIS — E1140 Type 2 diabetes mellitus with diabetic neuropathy, unspecified: Secondary | ICD-10-CM | POA: Diagnosis not present

## 2020-11-11 DIAGNOSIS — I43 Cardiomyopathy in diseases classified elsewhere: Secondary | ICD-10-CM | POA: Diagnosis present

## 2020-11-11 DIAGNOSIS — S32029S Unspecified fracture of second lumbar vertebra, sequela: Secondary | ICD-10-CM

## 2020-11-11 DIAGNOSIS — S32029A Unspecified fracture of second lumbar vertebra, initial encounter for closed fracture: Principal | ICD-10-CM | POA: Diagnosis present

## 2020-11-11 DIAGNOSIS — E876 Hypokalemia: Secondary | ICD-10-CM | POA: Diagnosis not present

## 2020-11-11 DIAGNOSIS — M4856XA Collapsed vertebra, not elsewhere classified, lumbar region, initial encounter for fracture: Secondary | ICD-10-CM | POA: Diagnosis not present

## 2020-11-11 DIAGNOSIS — Z8249 Family history of ischemic heart disease and other diseases of the circulatory system: Secondary | ICD-10-CM

## 2020-11-11 DIAGNOSIS — Z8679 Personal history of other diseases of the circulatory system: Secondary | ICD-10-CM

## 2020-11-11 DIAGNOSIS — N1831 Chronic kidney disease, stage 3a: Secondary | ICD-10-CM | POA: Diagnosis present

## 2020-11-11 DIAGNOSIS — I272 Pulmonary hypertension, unspecified: Secondary | ICD-10-CM | POA: Diagnosis present

## 2020-11-11 DIAGNOSIS — S32020A Wedge compression fracture of second lumbar vertebra, initial encounter for closed fracture: Secondary | ICD-10-CM | POA: Diagnosis present

## 2020-11-11 DIAGNOSIS — Z794 Long term (current) use of insulin: Secondary | ICD-10-CM

## 2020-11-11 DIAGNOSIS — F419 Anxiety disorder, unspecified: Secondary | ICD-10-CM | POA: Diagnosis present

## 2020-11-11 DIAGNOSIS — G4733 Obstructive sleep apnea (adult) (pediatric): Secondary | ICD-10-CM | POA: Diagnosis present

## 2020-11-11 DIAGNOSIS — Z951 Presence of aortocoronary bypass graft: Secondary | ICD-10-CM | POA: Diagnosis not present

## 2020-11-11 DIAGNOSIS — W1830XA Fall on same level, unspecified, initial encounter: Secondary | ICD-10-CM | POA: Diagnosis present

## 2020-11-11 DIAGNOSIS — Z20822 Contact with and (suspected) exposure to covid-19: Secondary | ICD-10-CM | POA: Diagnosis not present

## 2020-11-11 DIAGNOSIS — M109 Gout, unspecified: Secondary | ICD-10-CM | POA: Diagnosis present

## 2020-11-11 DIAGNOSIS — Z043 Encounter for examination and observation following other accident: Secondary | ICD-10-CM | POA: Diagnosis not present

## 2020-11-11 DIAGNOSIS — Z85828 Personal history of other malignant neoplasm of skin: Secondary | ICD-10-CM

## 2020-11-11 DIAGNOSIS — I2510 Atherosclerotic heart disease of native coronary artery without angina pectoris: Secondary | ICD-10-CM | POA: Diagnosis present

## 2020-11-11 DIAGNOSIS — M5126 Other intervertebral disc displacement, lumbar region: Secondary | ICD-10-CM | POA: Diagnosis not present

## 2020-11-11 DIAGNOSIS — Z743 Need for continuous supervision: Secondary | ICD-10-CM | POA: Diagnosis not present

## 2020-11-11 DIAGNOSIS — R791 Abnormal coagulation profile: Secondary | ICD-10-CM | POA: Diagnosis present

## 2020-11-11 DIAGNOSIS — M6281 Muscle weakness (generalized): Secondary | ICD-10-CM | POA: Diagnosis not present

## 2020-11-11 DIAGNOSIS — D72829 Elevated white blood cell count, unspecified: Secondary | ICD-10-CM | POA: Diagnosis not present

## 2020-11-11 DIAGNOSIS — Z7901 Long term (current) use of anticoagulants: Secondary | ICD-10-CM

## 2020-11-11 DIAGNOSIS — S32020D Wedge compression fracture of second lumbar vertebra, subsequent encounter for fracture with routine healing: Secondary | ICD-10-CM | POA: Diagnosis not present

## 2020-11-11 DIAGNOSIS — I482 Chronic atrial fibrillation, unspecified: Secondary | ICD-10-CM | POA: Diagnosis present

## 2020-11-11 DIAGNOSIS — I10 Essential (primary) hypertension: Secondary | ICD-10-CM | POA: Diagnosis present

## 2020-11-11 DIAGNOSIS — M4316 Spondylolisthesis, lumbar region: Secondary | ICD-10-CM | POA: Diagnosis not present

## 2020-11-11 DIAGNOSIS — I110 Hypertensive heart disease with heart failure: Secondary | ICD-10-CM | POA: Diagnosis not present

## 2020-11-11 DIAGNOSIS — R296 Repeated falls: Secondary | ICD-10-CM | POA: Diagnosis present

## 2020-11-11 DIAGNOSIS — M5136 Other intervertebral disc degeneration, lumbar region: Secondary | ICD-10-CM | POA: Diagnosis not present

## 2020-11-11 DIAGNOSIS — Z7401 Bed confinement status: Secondary | ICD-10-CM | POA: Diagnosis not present

## 2020-11-11 DIAGNOSIS — Z79899 Other long term (current) drug therapy: Secondary | ICD-10-CM

## 2020-11-11 DIAGNOSIS — M2550 Pain in unspecified joint: Secondary | ICD-10-CM | POA: Diagnosis not present

## 2020-11-11 DIAGNOSIS — I4819 Other persistent atrial fibrillation: Secondary | ICD-10-CM | POA: Diagnosis present

## 2020-11-11 DIAGNOSIS — Z888 Allergy status to other drugs, medicaments and biological substances status: Secondary | ICD-10-CM

## 2020-11-11 DIAGNOSIS — Z9989 Dependence on other enabling machines and devices: Secondary | ICD-10-CM

## 2020-11-11 DIAGNOSIS — S0990XA Unspecified injury of head, initial encounter: Secondary | ICD-10-CM | POA: Diagnosis not present

## 2020-11-11 DIAGNOSIS — M542 Cervicalgia: Secondary | ICD-10-CM | POA: Diagnosis not present

## 2020-11-11 DIAGNOSIS — Z9889 Other specified postprocedural states: Secondary | ICD-10-CM | POA: Diagnosis not present

## 2020-11-11 DIAGNOSIS — M1612 Unilateral primary osteoarthritis, left hip: Secondary | ICD-10-CM | POA: Diagnosis not present

## 2020-11-11 DIAGNOSIS — T45515A Adverse effect of anticoagulants, initial encounter: Secondary | ICD-10-CM | POA: Diagnosis present

## 2020-11-11 DIAGNOSIS — H3530 Unspecified macular degeneration: Secondary | ICD-10-CM | POA: Diagnosis present

## 2020-11-11 DIAGNOSIS — E1165 Type 2 diabetes mellitus with hyperglycemia: Secondary | ICD-10-CM | POA: Diagnosis not present

## 2020-11-11 DIAGNOSIS — F32A Depression, unspecified: Secondary | ICD-10-CM | POA: Diagnosis present

## 2020-11-11 DIAGNOSIS — K2100 Gastro-esophageal reflux disease with esophagitis, without bleeding: Secondary | ICD-10-CM | POA: Diagnosis not present

## 2020-11-11 DIAGNOSIS — Z87891 Personal history of nicotine dependence: Secondary | ICD-10-CM

## 2020-11-11 DIAGNOSIS — I119 Hypertensive heart disease without heart failure: Secondary | ICD-10-CM | POA: Diagnosis present

## 2020-11-11 DIAGNOSIS — M549 Dorsalgia, unspecified: Secondary | ICD-10-CM | POA: Diagnosis not present

## 2020-11-11 DIAGNOSIS — N183 Chronic kidney disease, stage 3 unspecified: Secondary | ICD-10-CM | POA: Diagnosis not present

## 2020-11-11 DIAGNOSIS — I5032 Chronic diastolic (congestive) heart failure: Secondary | ICD-10-CM | POA: Diagnosis not present

## 2020-11-11 LAB — CBC WITH DIFFERENTIAL/PLATELET
Abs Immature Granulocytes: 0.12 10*3/uL — ABNORMAL HIGH (ref 0.00–0.07)
Basophils Absolute: 0 10*3/uL (ref 0.0–0.1)
Basophils Relative: 0 %
Eosinophils Absolute: 0 10*3/uL (ref 0.0–0.5)
Eosinophils Relative: 0 %
HCT: 41.2 % (ref 39.0–52.0)
Hemoglobin: 13.3 g/dL (ref 13.0–17.0)
Immature Granulocytes: 1 %
Lymphocytes Relative: 4 %
Lymphs Abs: 0.7 10*3/uL (ref 0.7–4.0)
MCH: 31.1 pg (ref 26.0–34.0)
MCHC: 32.3 g/dL (ref 30.0–36.0)
MCV: 96.5 fL (ref 80.0–100.0)
Monocytes Absolute: 1.1 10*3/uL — ABNORMAL HIGH (ref 0.1–1.0)
Monocytes Relative: 7 %
Neutro Abs: 14.2 10*3/uL — ABNORMAL HIGH (ref 1.7–7.7)
Neutrophils Relative %: 88 %
Platelets: 187 10*3/uL (ref 150–400)
RBC: 4.27 MIL/uL (ref 4.22–5.81)
RDW: 16.7 % — ABNORMAL HIGH (ref 11.5–15.5)
WBC: 16.2 10*3/uL — ABNORMAL HIGH (ref 4.0–10.5)
nRBC: 0 % (ref 0.0–0.2)

## 2020-11-11 LAB — RESP PANEL BY RT-PCR (FLU A&B, COVID) ARPGX2
Influenza A by PCR: NEGATIVE
Influenza B by PCR: NEGATIVE
SARS Coronavirus 2 by RT PCR: NEGATIVE

## 2020-11-11 LAB — BASIC METABOLIC PANEL
Anion gap: 7 (ref 5–15)
BUN: 41 mg/dL — ABNORMAL HIGH (ref 8–23)
CO2: 23 mmol/L (ref 22–32)
Calcium: 8.7 mg/dL — ABNORMAL LOW (ref 8.9–10.3)
Chloride: 107 mmol/L (ref 98–111)
Creatinine, Ser: 1.52 mg/dL — ABNORMAL HIGH (ref 0.61–1.24)
GFR, Estimated: 46 mL/min — ABNORMAL LOW (ref >60)
Glucose, Bld: 71 mg/dL (ref 70–99)
Potassium: 4.8 mmol/L (ref 3.5–5.1)
Sodium: 137 mmol/L (ref 135–145)

## 2020-11-11 LAB — PROTIME-INR
INR: 3.4 — ABNORMAL HIGH (ref 0.8–1.2)
Prothrombin Time: 33.9 seconds — ABNORMAL HIGH (ref 11.4–15.2)

## 2020-11-11 LAB — GLUCOSE, CAPILLARY: Glucose-Capillary: 85 mg/dL (ref 70–99)

## 2020-11-11 MED ORDER — ALLOPURINOL 300 MG PO TABS
300.0000 mg | ORAL_TABLET | Freq: Every day | ORAL | Status: DC
  Administered 2020-11-12 – 2020-11-19 (×8): 300 mg via ORAL
  Filled 2020-11-11 (×8): qty 1

## 2020-11-11 MED ORDER — GABAPENTIN 300 MG PO CAPS
300.0000 mg | ORAL_CAPSULE | Freq: Two times a day (BID) | ORAL | Status: DC
  Administered 2020-11-11 – 2020-11-19 (×16): 300 mg via ORAL
  Filled 2020-11-11 (×16): qty 1

## 2020-11-11 MED ORDER — OXYCODONE-ACETAMINOPHEN 5-325 MG PO TABS: 1.0000 | ORAL_TABLET | Freq: Four times a day (QID) | ORAL | 0 refills | Status: DC | PRN

## 2020-11-11 MED ORDER — INSULIN ASPART 100 UNIT/ML IJ SOLN
0.0000 [IU] | Freq: Three times a day (TID) | INTRAMUSCULAR | Status: DC
  Administered 2020-11-12 – 2020-11-13 (×2): 1 [IU] via SUBCUTANEOUS
  Administered 2020-11-13: 3 [IU] via SUBCUTANEOUS
  Administered 2020-11-14 (×2): 2 [IU] via SUBCUTANEOUS
  Administered 2020-11-15: 1 [IU] via SUBCUTANEOUS
  Administered 2020-11-15: 3 [IU] via SUBCUTANEOUS
  Administered 2020-11-15: 1 [IU] via SUBCUTANEOUS
  Administered 2020-11-16: 2 [IU] via SUBCUTANEOUS
  Administered 2020-11-16: 1 [IU] via SUBCUTANEOUS
  Administered 2020-11-16 – 2020-11-17 (×2): 2 [IU] via SUBCUTANEOUS
  Administered 2020-11-17: 1 [IU] via SUBCUTANEOUS
  Administered 2020-11-17: 3 [IU] via SUBCUTANEOUS
  Administered 2020-11-18: 5 [IU] via SUBCUTANEOUS
  Administered 2020-11-18 (×2): 2 [IU] via SUBCUTANEOUS
  Administered 2020-11-19: 5 [IU] via SUBCUTANEOUS
  Administered 2020-11-19: 3 [IU] via SUBCUTANEOUS
  Filled 2020-11-11: qty 0.09

## 2020-11-11 MED ORDER — COLCHICINE 0.6 MG PO TABS
0.6000 mg | ORAL_TABLET | ORAL | Status: DC
  Administered 2020-11-13: 0.6 mg via ORAL
  Filled 2020-11-11: qty 1

## 2020-11-11 MED ORDER — OXYCODONE-ACETAMINOPHEN 5-325 MG PO TABS
1.0000 | ORAL_TABLET | ORAL | Status: DC | PRN
Start: 2020-11-11 — End: 2020-11-19
  Administered 2020-11-11 – 2020-11-12 (×2): 2 via ORAL
  Administered 2020-11-12: 1 via ORAL
  Administered 2020-11-12: 2 via ORAL
  Administered 2020-11-12: 1 via ORAL
  Administered 2020-11-13 – 2020-11-15 (×7): 2 via ORAL
  Administered 2020-11-16: 1 via ORAL
  Administered 2020-11-16 (×2): 2 via ORAL
  Administered 2020-11-17: 1 via ORAL
  Filled 2020-11-11 (×10): qty 2
  Filled 2020-11-11: qty 1
  Filled 2020-11-11: qty 2
  Filled 2020-11-11 (×3): qty 1
  Filled 2020-11-11: qty 2

## 2020-11-11 MED ORDER — ACETAMINOPHEN 500 MG PO TABS
1000.0000 mg | ORAL_TABLET | Freq: Four times a day (QID) | ORAL | Status: DC | PRN
  Administered 2020-11-12 – 2020-11-19 (×8): 1000 mg via ORAL
  Filled 2020-11-11 (×8): qty 2

## 2020-11-11 MED ORDER — CARVEDILOL 3.125 MG PO TABS
3.1250 mg | ORAL_TABLET | Freq: Two times a day (BID) | ORAL | Status: DC
  Administered 2020-11-12: 3.125 mg via ORAL
  Filled 2020-11-11: qty 1

## 2020-11-11 MED ORDER — DAPAGLIFLOZIN PROPANEDIOL 5 MG PO TABS
5.0000 mg | ORAL_TABLET | Freq: Every day | ORAL | Status: DC
  Administered 2020-11-12 – 2020-11-14 (×3): 5 mg via ORAL
  Filled 2020-11-11 (×3): qty 1

## 2020-11-11 MED ORDER — INSULIN ASPART PROT & ASPART (70-30 MIX) 100 UNIT/ML ~~LOC~~ SUSP
45.0000 [IU] | Freq: Every day | SUBCUTANEOUS | Status: DC
  Administered 2020-11-12: 45 [IU] via SUBCUTANEOUS
  Filled 2020-11-11: qty 10

## 2020-11-11 MED ORDER — OXYCODONE-ACETAMINOPHEN 5-325 MG PO TABS
1.0000 | ORAL_TABLET | Freq: Once | ORAL | Status: AC
  Administered 2020-11-11: 1 via ORAL
  Filled 2020-11-11: qty 1

## 2020-11-11 MED ORDER — MORPHINE SULFATE (PF) 2 MG/ML IV SOLN
1.0000 mg | INTRAVENOUS | Status: DC | PRN
Start: 2020-11-11 — End: 2020-11-19
  Administered 2020-11-11 – 2020-11-12 (×2): 2 mg via INTRAVENOUS
  Filled 2020-11-11 (×2): qty 1

## 2020-11-11 MED ORDER — ALPRAZOLAM 0.5 MG PO TABS
0.5000 mg | ORAL_TABLET | Freq: Every evening | ORAL | Status: DC | PRN
  Administered 2020-11-11 – 2020-11-13 (×3): 0.5 mg via ORAL
  Filled 2020-11-11 (×4): qty 1

## 2020-11-11 MED ORDER — SERTRALINE HCL 100 MG PO TABS
100.0000 mg | ORAL_TABLET | Freq: Every day | ORAL | Status: DC
  Administered 2020-11-11 – 2020-11-18 (×8): 100 mg via ORAL
  Filled 2020-11-11 (×8): qty 1

## 2020-11-11 MED ORDER — MORPHINE SULFATE (PF) 4 MG/ML IV SOLN
4.0000 mg | Freq: Once | INTRAVENOUS | Status: AC
  Administered 2020-11-11: 4 mg via INTRAVENOUS
  Filled 2020-11-11: qty 1

## 2020-11-11 NOTE — Plan of Care (Signed)

## 2020-11-11 NOTE — ED Provider Notes (Signed)
[facility]-EMERGENCY DEPT Provider Note   CSN: [id number] Arrival date & time: 11/11/20  1056     History Chief Complaint  Patient presents with   Back Pain    Cory O Rumbaugh Jr. is a 80 y.o. male.   Back Pain Associated symptoms: no abdominal pain, no chest pain, no dysuria and no fever    80-year-old male with a history of CKD, HTN, atrial flutter and atrial fibrillation on warfarin, CAD, DM 2,, recent fall on 10/29 with a resultant L2 compression fracture, treated with TLSO brace who presents with worsening lower lumbar spinal pain after a fall.  The patient states that he is on warfarin.  He has been taking the medication.  He states that yesterday he fell while trying to get into bed.  He landed on his buttocks.  He did not hit his head or lose consciousness.  He he denies any new urinary incontinence, numbness, weakness, saddle anesthesia, bowel incontinence.  Past Medical History:  Diagnosis Date   Adrenal adenoma    Arthritis    Atypical atrial flutter (HCC) 8/15, 10/15   a. DCCV 08/2013. b. s/p RFA 10/2013.   Basal cell carcinoma    CAD (coronary artery disease)    a. 04/2013 CABG x 2: LIMA to LAD, SVG to RI, EVH via R thigh.   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 s/p MAZE  04/2013 in setting of CABG. b. Amio stopped in 10/2013 after flutter ablation.   PFO (patent foramen ovale)    a. Small PFO by TEE 10/2013.   Pleural effusion, left    a. s/p thoracentesis 05/2013.   Respiratory failure (HCC)    a. Hypoxia 10/2013 - required supp O2 as inpatient, did not require it at discharge.   S/P Maze operation for atrial fibrillation    a. 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   Chronic restrictive lung disease 09/03/2014   Pulmonary hypertension (HCC) 08/23/2014   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HCC) 01/30/2014   OSA on CPAP 01/30/2014   PFO (patent foramen ovale) 10/30/2013     CAD (coronary artery disease) 10/29/2013   Essential hypertension 10/25/2013   Chronic anticoagulation (INR goal 2.0-2.5) 08/22/2013   Atrial flutter ([place]) 08/21/2013   Vitamin D deficiency 05/19/2013   Type 2 diabetes mellitus with stage 4 chronic kidney disease, with long-term current use of insulin (HCC)    GERD    DJD (degenerative joint disease)    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 04/05/2013   S/P Maze operation for atrial fibrillation 04/05/2013   Chronic diastolic congestive heart failure ([place])    Hypertensive cardiomyopathy ([place])    Hyperlipidemia associated with type 2 diabetes mellitus ([place]) 09/03/2008   PAF (paroxysmal atrial fibrillation) ([place]) 09/03/2008    Diverticulosis of large intestine 09/03/2008   Colonic Polyps 09/03/2008    Past Surgical History:  Procedure Laterality Date   APPENDECTOMY  03/05/2017   laproscopic   ATRIAL FIBRILLATION ABLATION N/A 10/26/2013   Procedure: ATRIAL FIBRILLATION ABLATION;  Surgeon: Coralyn Mark, MD;  Location: [place] CATH LAB;  Service: Cardiovascular;  Laterality: N/A;   BASAL CELL CARCINOMA EXCISION     x3 on face   BIOPSY  06/12/2020   Procedure: BIOPSY;  Surgeon: Jerene Bears, MD;  Location: [facility] ENDOS[facility];  Service: Gastroenterology;;   CARDIAC CATHETERIZATION     myocardial bridge but no cad   CARDIOVERSION N/A 08/23/2013   Procedure: CARDIOVERSION;  Surgeon: Sanda Klein, MD;  Location: [place];  Service: Cardiovascular;  Laterality: N/A;   CARPOMETACARPEL SUSPENSION PLASTY Left 02/14/2014   Procedure: CARPOMETACARPEL ([place]) SUSPENSIONPLASTY THUMB  WITH  ABDUCTOR POLLICIS LONGUS TRANSFER AND STENOSING TENOSYNOVITIS RELEASE LEFT WRIST;  Surgeon: Charlotte Crumb, MD;  Location: [place];  Service: Orthopedics;  Laterality: Left;   CATARACT EXTRACTION Bilateral    COLONOSCOPY WITH PROPOFOL N/A 01/26/2017   Procedure: COLONOSCOPY WITH PROPOFOL;  Surgeon: Doran Stabler, MD;  Location: [facility];  Service: Gastroenterology;  Laterality: N/A;   CORONARY ARTERY BYPASS GRAFT N/A 04/05/2013   Procedure: CORONARY ARTERY BYPASS GRAFTING (CABG) TIMES TWO USING LEFT INTERNAL MAMMARY ARTERY AND RIGHT SAPHENOUS LEG VEIN HARVESTED ENDOSCOPICALLY;  Surgeon: Rexene Alberts, MD;  Location: [place];  Service: Open Heart Surgery;  Laterality: N/A;   ENTEROSCOPY N/A 06/12/2020   Procedure: ENTEROSCOPY;  Surgeon: Jerene Bears, MD;  Location: [facility];  Service: Gastroenterology;  Laterality: N/A;   ESOPHAGOGASTRODUODENOSCOPY (EGD) WITH PROPOFOL N/A 01/26/2017   Procedure: ESOPHAGOGASTRODUODENOSCOPY (EGD) WITH PROPOFOL;  Surgeon: Doran Stabler, MD;  Location: [facility];  Service:  Gastroenterology;  Laterality: N/A;   EYE SURGERY Bilateral    Cat Sx   EYE SURGERY Right    RD repair - SB   GREAT TOE ARTHRODESIS, INTERPHALANGEAL JOINT     Right foot   HOT HEMOSTASIS N/A 06/12/2020   Procedure: HOT HEMOSTASIS (ARGON PLASMA COAGULATION/BICAP);  Surgeon: Jerene Bears, MD;  Location: [facility];  Service: Gastroenterology;  Laterality: N/A;   INTRAOPERATIVE TRANSESOPHAGEAL ECHOCARDIOGRAM N/A 04/05/2013   Procedure: INTRAOPERATIVE TRANSESOPHAGEAL ECHOCARDIOGRAM;  Surgeon: Rexene Alberts, MD;  Location: [place];  Service: Open Heart Surgery;  Laterality: N/A;   LAPAROSCOPIC APPENDECTOMY N/A 03/05/2017   Procedure: APPENDECTOMY LAPAROSCOPIC;  Surgeon: Judeth Horn, MD;  Location: [place];  Service: General;  Laterality: N/A;   LEFT HEART CATHETERIZATION WITH CORONARY ANGIOGRAM N/A 03/07/2013   Procedure: LEFT HEART CATHETERIZATION WITH CORONARY ANGIOGRAM;  Surgeon: Burnell Blanks, MD;  Location[facility] CATH LAB;  Service: Cardiovascular;  Laterality: N/A;   MAZE N/A 04/05/2013  Procedure: MAZE;  Surgeon: Rexene Alberts, MD;  Location: [place];  Service: Open Heart Surgery;  Laterality: N/A;   Polinydal cyst     Removed   POLYPECTOMY     Retina repair-right     RETINAL DETACHMENT SURGERY Right    RD Repair - SB   RIGHT HEART CATH N/A 07/06/2019   Procedure: RIGHT HEART CATH;  Surgeon: Jolaine Artist, MD;  Location: [place] CV LAB;  Service: Cardiovascular;  Laterality: N/A;   RIGHT HEART CATHETERIZATION N/A 05/03/2014   Procedure: RIGHT HEART CATH;  Surgeon: Jolaine Artist, MD;  Location: [facility] CATH LAB;  Service: Cardiovascular;  Laterality: N/A;   TEE WITHOUT CARDIOVERSION N/A 08/23/2013   Procedure: TRANSESOPHAGEAL ECHOCARDIOGRAM (TEE);  Surgeon: Sanda Klein, MD;  Location: [facility] ENDOSCOPY;  Service: Cardiovascular;  Laterality: N/A;   TEE WITHOUT CARDIOVERSION N/A 10/26/2013   Procedure: TRANSESOPHAGEAL ECHOCARDIOGRAM (TEE);  Surgeon: Sueanne Margarita, MD;  Location: [facility]  ENDOSCOPY;  Service: Cardiovascular;  Laterality: N/A;   TEE WITHOUT CARDIOVERSION N/A 06/05/2014   Procedure: TRANSESOPHAGEAL ECHOCARDIOGRAM (TEE);  Surgeon: Thayer Headings, MD;  Location: [facility] ENDOSCOPY;  Service: Cardiovascular;  Laterality: N/A;   TRAPEZIUM RESECTION         Family History  Problem Relation Age of Onset   Dementia Father    Colon cancer Mother        Family History/Uncle    Colon polyps Mother        Family History   Atrial fibrillation Mother    Hypertension Mother    Colon polyps Sister        Family history   Diabetes Maternal Uncle    Stroke Paternal Uncle     Social History   Tobacco Use   Smoking status: Former    Packs/day: 4.00    Years: 25.00    Pack years: 100.00    Types: Cigarettes    Quit date: 01/05/1981    Years since quitting: 39.8   Smokeless tobacco: Never  Vaping Use   Vaping Use: Never used  Substance Use Topics   Alcohol use: Yes    Alcohol/week: 1.0 standard drink    Types: 1 Shots of liquor per week    Comment: 1-5 drinks per week   Drug use: No    Home Medications Prior to Admission medications   Medication Sig Start Date End Date Taking? Authorizing Provider  oxyCODONE-acetaminophen (PERCOCET/ROXICET) 5-325 MG tablet Take 1-2 tablets by mouth every 6 (six) hours as needed for severe pain. 11/11/20  Yes Regan Lemming, MD  ACCU-CHEK AVIVA PLUS test strip CHECK BLOOD GLUCOSE 3 TIMES DAILY 11/26/19   [name], MD  acetaminophen (TYLENOL) 500 MG tablet Take 1,000 mg by mouth every 6 (six) hours as needed for moderate pain or headache.     [provider]  allopurinol (ZYLOPRIM) 300 MG tablet Take 300 mg by mouth daily.    [provider]  ALPRAZolam Duanne Moron) 1 MG tablet Take 0.5 tablets (0.5 mg total) by mouth at bedtime as needed for sleep. 03/23/16   [name], MD  Alum Hydroxide-Mag Carbonate (GAVISCON PO) Take 4 tablets by mouth daily as needed (acid reflux).     [provider]   Ascorbic Acid (VITAMIN C) 1000 MG tablet Take 1,000 mg by mouth daily.    [provider]  atorvastatin (LIPITOR) 20 MG tablet Take  1 tablet  3 x /week  for Cholesterol 04/21/20   [name], MD  azelastine (ASTELIN)  0.1 % nasal spray Place 2 sprays into both nostrils 2 (two) times daily as needed for rhinitis or allergies. 04/30/20   [name], MD  B Complex Vitamins (VITAMIN B COMPLEX PO) Take 1 tablet by mouth at bedtime.     [provider]  bacitracin 500 UNIT/GM ointment Apply 1 application topically daily as needed for wound care.    [provider]  Blood Glucose Monitoring Suppl (ACCU-CHEK AVIVA PLUS) w/Device KIT Check blood sugar 1 time  daily 07/12/15   [name], MD  Blood Glucose Monitoring Suppl DEVI Test blood sugar up to three times a day or as directed. 05/24/17   Liane Comber, NP  carvedilol (COREG) 6.25 MG tablet TAKE ONE-HALF TABLET BY  MOUTH IN THE MORNING AND 1  TABLET BY MOUTH IN THE  EVENING 07/15/20   Bensimhon, Shaune Pascal, MD  Cholecalciferol (VITAMIN D PO) Take 5,000 Units by mouth daily.     [provider]  colchicine 0.6 MG tablet Take 0.6 mg by mouth every other day.    [provider]  Continuous Blood Gluc Receiver (DEXCOM G6 RECEIVER) DEVI Use to check blood sugar multiple times a day for Hypoglycemia and Hyperglycemia 08/19/20   [name], MD  Continuous Blood Gluc Sensor (DEXCOM G6 SENSOR) MISC Use to check blood sugar multiple times a day for Hypoglycemia and Hyperglycemia 08/19/20   [name], MD  Continuous Blood Gluc Transmit (DEXCOM G6 TRANSMITTER) MISC Use to check blood sugar multiple times a day for Hypoglycemia and Hyperglycemia 08/19/20   [name], MD  cyclobenzaprine (FLEXERIL) 10 MG tablet Take 1/2 to 1 tablet 3 x /day as needed for Muscle Spasm 10/30/20   [name], MD  dapagliflozin propanediol (FARXIGA) 5 MG TABS tablet Take 1 tablet (5 mg total) by mouth daily  before breakfast. 07/04/20   Bensimhon, Shaune Pascal, MD  dexamethasone (DECADRON) 4 MG tablet Take 1 tab 3 x day - 3 days, then 2 x day - 3 days, then 1 tab daily 10/30/20   [name], MD  diphenhydrAMINE (BENADRYL) 25 MG tablet Take 25 mg by mouth as needed.    [provider]  fluticasone (FLONASE) 50 MCG/ACT nasal spray Place 1 spray into both nostrils daily as needed for allergies. 03/14/20   [name], MD  gabapentin (NEURONTIN) 600 MG tablet Take  1/2 to 1 tablet  2 to 3 x /Daily  as needed for Pain 11/08/20   [name], MD  HYDROcodone-acetaminophen (NORCO/VICODIN) 5-325 MG tablet 1 tablet as needed    [provider]  insulin NPH-regular Human (70-30) 100 UNIT/ML injection Inject 45 Units into the skin daily with breakfast. 25units in the evening    [provider]  loratadine (CLARITIN) 10 MG tablet Take 10 mg by mouth daily as needed for allergies.     [provider]  metolazone (ZAROXOLYN) 2.5 MG tablet Take 2.5 mg by mouth daily. Only as needed for CHF related weight gain    [provider]  metolazone (ZAROXOLYN) 5 MG tablet 1 tablet as needed    [provider]  Multiple Vitamins-Minerals (PRESERVISION AREDS 2) CAPS Take 1 capsule by mouth 2 (two) times daily.     [provider]  oxyCODONE (ROXICODONE) 5 MG immediate release tablet Take 1 tablet (5 mg total) by mouth every 6 (six) hours as needed for up to 10 doses for severe pain. 11/02/20   Wyvonnia Dusky, MD  polyvinyl alcohol (LIQUIFILM TEARS) 1.4 %  ophthalmic solution Place 1 drop into both eyes daily as needed for dry eyes.    [provider]  potassium chloride SA (KLOR-CON) 20 MEQ tablet TAKE 2 TABLETS BY MOUTH 3  TIMES DAILY FOR POTASSIUM 03/16/20   Corbett, Ashley, NP  senna-docusate (SENOKOT-S) 8.6-50 MG tablet Take 1 tablet by mouth daily as needed for up to 20 doses for mild constipation. Take with roxicodone to prevent constipation  11/02/20   Trifan, Matthew J, MD  sertraline (ZOLOFT) 100 MG tablet TAKE 1 TABLET BY MOUTH  DAILY FOR MOOD 03/18/20   Bensimhon, Daniel R, MD  torsemide (DEMADEX) 100 MG tablet Take 150 mg by mouth daily. 1.5 tablet    [provider]  trimethoprim-polymyxin b (POLYTRIM) ophthalmic solution Only when he has eye injections 07/03/20   [provider]  warfarin (COUMADIN) 10 MG tablet Take 10 mg by mouth 2 (two) times a week.    [provider]  warfarin (COUMADIN) 5 MG tablet Take    1.5 tablets /day      or as directed Patient taking differently: Take 1.5 tablets by mouth x 5 days weekly 12/11/19   McKeown, William, MD  zinc gluconate 50 MG tablet Take 50 mg by mouth daily.    [provider]    Allergies    Sunflower oil, Horse-derived products, Other, Immune globulin, Tetanus toxoids, and Tetanus toxoid  Review of Systems   Review of Systems  Constitutional:  Negative for chills and fever.  HENT:  Negative for ear pain and sore throat.   Eyes:  Negative for pain and visual disturbance.  Respiratory:  Negative for cough and shortness of breath.   Cardiovascular:  Negative for chest pain and palpitations.  Gastrointestinal:  Negative for abdominal pain and vomiting.  Genitourinary:  Negative for dysuria and hematuria.  Musculoskeletal:  Positive for back pain. Negative for arthralgias.  Skin:  Negative for color change and rash.  Neurological:  Negative for seizures and syncope.  All other systems reviewed and are negative.  Physical Exam Updated Vital Signs BP (!) 143/92 (BP Location: Left Arm)   Pulse 69   Temp 98.3 F (36.8 C) (Oral)   Resp 18   Ht 5' 11" (1.803 m)   Wt 78 kg   SpO2 97%   BMI 23.99 kg/m   Physical Exam Vitals and nursing note reviewed.  Constitutional:      Appearance: He is well-developed.     Comments: GCS 15, ABC intact  HENT:     Head: Normocephalic.  Eyes:     Conjunctiva/sclera: Conjunctivae normal.  Neck:      Comments: No midline tenderness to palpation of the cervical spine. ROM intact. Cardiovascular:     Rate and Rhythm: Normal rate and regular rhythm.     Heart sounds: No murmur heard. Pulmonary:     Effort: Pulmonary effort is normal. No respiratory distress.     Breath sounds: Normal breath sounds.  Chest:     Comments: TLSO brace in place. Abdominal:     Palpations: Abdomen is soft.     Tenderness: There is no abdominal tenderness.     Comments: Pelvis stable to lateral compression.  Musculoskeletal:     Cervical back: Neck supple.     Comments: No midline tenderness of the thoracic spine.  Lumbar spinal tenderness.  Extremities atraumatic with intact ROM.   Skin:    General: Skin is warm and dry.  Neurological:     Mental Status: He   is alert.     Comments: CN II-XII grossly intact. Moving all four extremities spontaneously and sensation grossly intact. 5/5 LE strength bilaterally.    ED Results / Procedures / Treatments   Labs (all labs ordered are listed, but only abnormal results are displayed) Labs Reviewed  URINALYSIS, ROUTINE W REFLEX MICROSCOPIC  CBC WITH DIFFERENTIAL/PLATELET  BASIC METABOLIC PANEL  PROTIME-INR    EKG None  Radiology CT HEAD WO CONTRAST (5MM)  Result Date: 11/11/2020 CLINICAL DATA:  Status post fall from bed last night. Posterior neck pain. Generalized weakness. EXAM: CT HEAD WITHOUT CONTRAST CT CERVICAL SPINE WITHOUT CONTRAST TECHNIQUE: Multidetector CT imaging of the head and cervical spine was performed following the standard protocol without intravenous contrast. Multiplanar CT image reconstructions of the cervical spine were also generated. COMPARISON:  None. FINDINGS: CT HEAD FINDINGS Brain: There is no evidence for acute hemorrhage, hydrocephalus, mass lesion, or abnormal extra-axial fluid collection. No definite CT evidence for acute infarction. Diffuse loss of parenchymal volume is consistent with atrophy. Patchy low attenuation in the deep  hemispheric and periventricular white matter is nonspecific, but likely reflects chronic microvascular ischemic demyelination. Vascular: No hyperdense vessel or unexpected calcification. Skull: No evidence for fracture. No worrisome lytic or sclerotic lesion. Sinuses/Orbits: Chronic mucosal disease noted in the left frontal, left ethmoid, and left maxillary sinuses, potentially related to a ostiomeatal complex disease. Mastoid air cells are clear bilaterally. Visualized portions of the globes and intraorbital fat are unremarkable. Other: None. CT CERVICAL SPINE FINDINGS Alignment: Straightening of normal cervical lordosis evident. Trace anterolisthesis of C4 on 5 likely related to the loss of facet space at the same level. Skull base and vertebrae: No acute fracture. No primary bone lesion or focal pathologic process. Soft tissues and spinal canal: No prevertebral fluid or swelling. No visible canal hematoma. Disc levels: Loss of disc height noted C5-6 and C6-7 as well as C7-T1. Upper chest: Pleural thickening noted medial left lung apex (84/4). Other: None. IMPRESSION: 1. No acute intracranial abnormality. Atrophy with chronic small vessel white matter ischemic disease. 2. 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 No evidence for cervical spine fracture. Loss of normal cervical lordosis. This can be related to patient positioning, muscle spasm or soft tissue injury. 4. Degenerative disc changes in the cervical spine. 5. Pleural thickening medial left lung apex. This should be further assessed with dedicated CT chest without contrast. Electronically Signed   By: Eric  Mansell M.D.   On: 11/11/2020 13:02   CT Cervical Spine Wo Contrast  Result Date: 11/11/2020 CLINICAL DATA:  Status post fall from bed last night. Posterior neck pain. Generalized weakness. EXAM: CT HEAD WITHOUT CONTRAST CT CERVICAL SPINE WITHOUT CONTRAST TECHNIQUE: Multidetector CT imaging of the  head and cervical spine was performed following the standard protocol without intravenous contrast. Multiplanar CT image reconstructions of the cervical spine were also generated. COMPARISON:  None. FINDINGS: CT HEAD FINDINGS Brain: There is no evidence for acute hemorrhage, hydrocephalus, mass lesion, or abnormal extra-axial fluid collection. No definite CT evidence for acute infarction. Diffuse loss of parenchymal volume is consistent with atrophy. Patchy low attenuation in the deep hemispheric and periventricular white matter is nonspecific, but likely reflects chronic microvascular ischemic demyelination. Vascular: No hyperdense vessel or unexpected calcification. Skull: No evidence for fracture. No worrisome lytic or sclerotic lesion. Sinuses/Orbits: Chronic mucosal disease noted in the left frontal, left ethmoid, and left maxillary sinuses, potentially related to a ostiomeatal complex disease. Mastoid air cells are   clear bilaterally. Visualized portions of the globes and intraorbital fat are unremarkable. Other: None. CT CERVICAL SPINE FINDINGS Alignment: Straightening of normal cervical lordosis evident. Trace anterolisthesis of C4 on 5 likely related to the loss of facet space at the same level. Skull base and vertebrae: No acute fracture. No primary bone lesion or focal pathologic process. Soft tissues and spinal canal: No prevertebral fluid or swelling. No visible canal hematoma. Disc levels: Loss of disc height noted C5-6 and C6-7 as well as C7-T1. Upper chest: Pleural thickening noted medial left lung apex (84/4). Other: None. IMPRESSION: 1. No acute intracranial abnormality. Atrophy with chronic small vessel white matter ischemic disease. 2. 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 No evidence for cervical spine fracture. Loss of normal cervical lordosis. This can be related to patient positioning, muscle spasm or soft tissue injury. 4. Degenerative  disc changes in the cervical spine. 5. Pleural thickening medial left lung apex. This should be further assessed with dedicated CT chest without contrast. Electronically Signed   By: Misty Stanley M.D.   On: 11/11/2020 13:02   CT Lumbar Spine Wo Contrast  Result Date: 11/11/2020 CLINICAL DATA:  Recent L2 vertebral body fracture, fall last night EXAM: CT LUMBAR SPINE WITHOUT CONTRAST TECHNIQUE: Multidetector CT imaging of the lumbar spine was performed without intravenous contrast administration. Multiplanar CT image reconstructions were also generated. COMPARISON:  CT examination dated November 02, 2020 FINDINGS: Segmentation: 5 lumbar type vertebrae. Alignment: Grade 1 anterolisthesis of L4 Vertebrae: Interval progression of the compression deformity of L 2 vertebral body now approximately 60% vertebral body height loss. There is approximately 2 mm retropulsion into the spinal canal of the superior endplate. Diffuse osteopenia. No aggressive osseous. Paraspinal and other soft tissues: Mild soft tissue swelling tissue edema on the anterior aspect of the L2 vertebral body Disc levels: Mild multilevel degenerate disc disease with disc protrusion prominent at L4 and L5 with associated facet joint arthropathy. IMPRESSION: 1. Interval progression of compression deformity of L2 vertebral body now approximately 60% vertebral body height loss and with approximately 2 mm retropulsion into the spinal canal of the superior endplate. 2.  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 Electronically Signed   By: Keane Police D.O.   On: 11/11/2020 13:36   DG Hip Unilat W or Wo Pelvis 2-3 Views Left  Result Date: 11/11/2020 CLINICAL DATA:  Status post fall. EXAM: DG HIP (WITH OR WITHOUT PELVIS) 2-3V LEFT COMPARISON:  None. FINDINGS: There is no evidence of hip fracture or dislocation. 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 IMPRESSION: 1. No acute findings. 2. Mild bilateral and symmetric  osteoarthritis. Electronically Signed   By: Kerby Moors M.D.   On: 11/11/2020 12:53    Procedures Procedures   Medications Ordered in ED Medications  oxyCODONE-acetaminophen (PERCOCET/ROXICET) 5-325 MG per tablet 1 tablet (1 tablet Oral Given 11/11/20 1434)    ED Course  I have reviewed the triage vital signs and the nursing notes.  Pertinent labs & imaging results that were available during my care of the patient were reviewed by me and considered in my medical decision making (see chart for details).    MDM Rules/Calculators/A&P                            80 year old male with a history of CKD, HTN, atrial flutter and atrial fibrillation on warfarin, CAD, DM2, recent fall on 10/29 with a resultant L2  compression fracture, treated with TLSO brace who presents with worsening lower lumbar spinal pain after a fall.  The patient states that he is on warfarin.  He has been taking the medication.  He states that yesterday he fell while trying to get into bed.  He landed on his buttocks.  He did not hit his head or lose consciousness.  He he denies any new urinary incontinence, numbness, weakness, saddle anesthesia, bowel incontinence.  On arrival, the patient was stable.  Neurologically intact with no red flag symptoms for spinal cord compression.  Labs were obtained to include CBC, BMP, PT/INR.   Patient presents with worsening lower lumbar back pain with no neurologic deficits after fall.  CT imaging from first look obtained to evaluate for  spinal fracture versus intracranial abnormality.  CT imaging of the head and cervical spine was negative for acute fracture or malalignment, negative for acute intracranial abnormality.  CT imaging of the lumbar spine was concerning for worsening L2 compression fracture with new 60% height loss and 2 mm bony retropulsion.  Labs were obtained and pending at time of signout.  1 Percocet was ordered and a Percocet prescription was sent to the patient's  pharmacy.  Plan at time of signout to reassess the patient's pain, disposition pending pain control.  Signout given to Dr. Mazyck at 1500.  Final Clinical Impression(s) / ED Diagnoses Final diagnoses:  Closed fracture of second lumbar vertebra, unspecified fracture morphology, sequela    Rx / DC Orders ED Discharge Orders          Ordered    oxyCODONE-acetaminophen (PERCOCET/ROXICET) 5-325 MG tablet  Every 6 hours PRN        11/11/20 1422             Lawsing, James, MD 11/11/20 1639  

## 2020-11-11 NOTE — ED Notes (Signed)
Urine requested  ?

## 2020-11-11 NOTE — H&P (Signed)
History and Physical    [name]. EXN:[id number] DOB: May 15, 1940 DOA: 11/11/2020  PCP: [name], MD  Patient coming from: Home.   I have personally briefly reviewed patient's old medical records in [place]  Chief Complaint: fall last night.   HPI: [name]. is a 80 y.o. male with medical history significant of   atrial flutter s/p DCCV, CAD, Adrenal adenoma,  stage 3a ckd, chronic diastolic heart failure, type 2 DM, depression, GERD, hypertension, multiple falls, obstructive sleep apnea had a mechanical fall recently on 11/02/20, was found to have a L2 compression fracture, treated with TLSO brace, and pain meds comes to ED today after a mechanical fall last night.  Pt denies losing consciousness or hitting his head. He denies chest pain or sob, nausea, vomiting , abdominal pain, diarrhea, fever and chills. He denies any urinary or bowel incontinence. Pt reports left sided back pain moving down to the leg. He reports moderate mid back pain and lower back pain.  ED Course: pt was found to be afebrile. Hypertensive 168/107 mmhg, labs reveal creatinine of 1.52 , wbc count of 16.2, iNR of 3.4, COVID pcr and influenza pcr is negative. CT head and cervical spine does not show any acute intracranial abnormality and no cervical fracture.  CT lumbar spine showed Interval progression of compression deformity of L2 vertebral body now approximately 60% vertebral body height loss and with approximately 2 mm retropulsion into the spinal canal of the superior endplate  HIp X rays reveal No acute findings.  Mild bilateral and symmetric osteoarthritis.  IR consulted for Kyphoplasty.  He was referred to [facility] for admission for pain control and referral to IR for kyphoplasty.   Review of Systems: As per HPI "All others reviewed and are negative," Past Medical History:  Diagnosis Date   Adrenal adenoma    Arthritis    Atypical atrial flutter ([place]) 8/15, 10/15   a. DCCV 08/2013.  b. s/p RFA 10/2013.   Basal cell carcinoma    CAD (coronary artery disease)    a. 04/2013 CABG x 2: LIMA to LAD, SVG to RI, EVH via R thigh.   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 s/p MAZE 04/2013 in setting of CABG. b. Amio stopped in 10/2013 after flutter ablation.   PFO (patent foramen ovale)    a. Small PFO by TEE 10/2013.   Pleural effusion, left    a. s/p thoracentesis 05/2013.   Respiratory failure ([place])    a. Hypoxia 10/2013 - required supp O2 as inpatient, did not require it at discharge.   S/P Maze operation for atrial fibrillation    a. 04/2013: Complete bilateral atrial lesion set using  cryothermy and bipolar radiofrequency ablation with clipping of LA appendage (@ time of CABG)    Past Surgical History:  Procedure Laterality Date   APPENDECTOMY  03/05/2017   laproscopic   ATRIAL FIBRILLATION ABLATION N/A 10/26/2013   Procedure: ATRIAL FIBRILLATION ABLATION;  Surgeon: Coralyn Mark, MD;  Location: [facility] CATH LAB;  Service: Cardiovascular;  Laterality: N/A;   BASAL CELL CARCINOMA EXCISION     x3 on face   BIOPSY  06/12/2020   Procedure: BIOPSY;  Surgeon: Jerene Bears, MD;  Location: [facility] ENDOSCOPY;  Service: Gastroenterology;;   CARDIAC CATHETERIZATION     myocardial bridge but no cad   CARDIOVERSION N/A  08/23/2013   Procedure: CARDIOVERSION;  Surgeon: Sanda Klein, MD;  Location: [place];  Service: Cardiovascular;  Laterality: N/A;   CARPOMETACARPEL SUSPENSION PLASTY Left 02/14/2014   Procedure: CARPOMETACARPEL ([place]) SUSPENSIONPLASTY THUMB  WITH  ABDUCTOR POLLICIS LONGUS TRANSFER AND STENOSING TENOSYNOVITIS RELEASE LEFT WRIST;  Surgeon: Charlotte Crumb, MD;  Location: [place];  Service: Orthopedics;  Laterality: Left;   CATARACT EXTRACTION Bilateral    COLONOSCOPY WITH PROPOFOL N/A 01/26/2017   Procedure: COLONOSCOPY WITH PROPOFOL;  Surgeon: Doran Stabler, MD;  Location: [facility]OS[facility];  Service: Gastroenterology;  Laterality: N/A;   CORONARY ARTERY BYPASS GRAFT N/A 04/05/2013   Procedure: CORONARY ARTERY BYPASS GRAFTING (CABG) TIMES TWO USING LEFT INTERNAL MAMMARY ARTERY AND RIGHT SAPHENOUS LEG VEIN HARVESTED ENDOSCOPICALLY;  Surgeon: Rexene Alberts, MD;  Location: [place];  Service: Open Heart Surgery;  Laterality: N/A;   ENTEROSCOPY N/A 06/12/2020   Procedure: ENTEROSCOPY;  Surgeon: Jerene Bears, MD;  Location: [facility];  Service: Gastroenterology;  Laterality: N/A;   ESOPHAGOGASTRODUODENOSCOPY (EGD) WITH PROPOFOL N/A 01/26/2017   Procedure: ESOPHAGOGASTRODUODENOSCOPY (EGD) WITH PROPOFOL;  Surgeon: Doran Stabler, MD;  Location: [facility];  Service: Gastroenterology;  Laterality: N/A;   EYE SURGERY Bilateral    Cat Sx   EYE SURGERY Right    RD repair - SB   GREAT TOE ARTHRODESIS, INTERPHALANGEAL JOINT     Right foot   HOT HEMOSTASIS N/A 06/12/2020   Procedure: HOT HEMOSTASIS (ARGON PLASMA COAGULATION/BICAP);  Surgeon: Jerene Bears, MD;  Location: [facility];  Service: Gastroenterology;  Laterality: N/A;   INTRAOPERATIVE TRANSESOPHAGEAL ECHOCARDIOGRAM N/A 04/05/2013   Procedure: INTRAOPERATIVE TRANSESOPHAGEAL ECHOCARDIOGRAM;  Surgeon: Rexene Alberts, MD;  Location: [place];  Service: Open Heart Surgery;  Laterality: N/A;   LAPAROSCOPIC APPENDECTOMY N/A 03/05/2017    Procedure: APPENDECTOMY LAPAROSCOPIC;  Surgeon: Judeth Horn, MD;  Location: [place];  Service: General;  Laterality: N/A;   LEFT HEART CATHETERIZATION WITH CORONARY ANGIOGRAM N/A 03/07/2013   Procedure: LEFT HEART CATHETERIZATION WITH CORONARY ANGIOGRAM;  Surgeon: Burnell Blanks, MD;  Location: [facility] CATH LAB;  Service: Cardiovascular;  Laterality: N/A;   MAZE N/A 04/05/2013   Procedure: MAZE;  Surgeon: Rexene Alberts, MD;  Location: [place];  Service: Open Heart Surgery;  Laterality: N/A;   Polinydal cyst     Removed   POLYPECTOMY     Retina repair-right     RETINAL DETACHMENT SURGERY Right    RD Repair - SB   RIGHT HEART CATH N/A 07/06/2019   Procedure: RIGHT HEART CATH;  Surgeon: Jolaine Artist, MD;  Location: [place] CV LAB;  Service: Cardiovascular;  Laterality: N/A;   RIGHT HEART CATHETERIZATION N/A 05/03/2014   Procedure: RIGHT HEART CATH;  Surgeon: Jolaine Artist, MD;  Location: [facility] CATH LAB;  Service: Cardiovascular;  Laterality: N/A;  TEE WITHOUT CARDIOVERSION N/A 08/23/2013   Procedure: TRANSESOPHAGEAL ECHOCARDIOGRAM (TEE);  Surgeon: Sanda Klein, MD;  Location: [facility] ENDOSCOPY;  Service: Cardiovascular;  Laterality: N/A;   TEE WITHOUT CARDIOVERSION N/A 10/26/2013   Procedure: TRANSESOPHAGEAL ECHOCARDIOGRAM (TEE);  Surgeon: Sueanne Margarita, MD;  Location: [facility] ENDOSCOPY;  Service: Cardiovascular;  Laterality: N/A;   TEE WITHOUT CARDIOVERSION N/A 06/05/2014   Procedure: TRANSESOPHAGEAL ECHOCARDIOGRAM (TEE);  Surgeon: Thayer Headings, MD;  Location: [facility] ENDOSCOPY;  Service: Cardiovascular;  Laterality: N/A;   TRAPEZIUM RESECTION      Social History  reports that he quit smoking about 39 years ago. His smoking use included cigarettes. He has a 100.00 pack-year smoking history. He has never used smokeless tobacco. He reports current alcohol use of about 1.0 standard drink per week. He reports that he does not use drugs.  Allergies  Allergen Reactions   Sunflower Oil Swelling  and Other (See Comments)    Tongue and lip swelling Other reaction(s): Other Tongue and lip swelling   Horse-Derived Products Other (See Comments)    Per allergy skin test UNSPECIFIED REACTION  Other reaction(s): Other Per allergy skin test UNSPECIFIED REACTION    Other Other (See Comments)    Tetanus Shot -- skin test reaction   Immune Globulin    Tetanus Toxoids Other (See Comments)    Per allergy skin test   Tetanus Toxoid     Other reaction(s): Other (See Comments) Rash(horse serum) Other reaction(s): Other Rash(horse serum)    Family History  Problem Relation Age of Onset   Dementia Father    Colon cancer Mother        Family History/Uncle    Colon polyps Mother        Family History   Atrial fibrillation Mother    Hypertension Mother    Colon polyps Sister        Family history   Diabetes Maternal Uncle    Stroke Paternal Uncle    No family history of spine issues.   Prior to Admission medications   Medication Sig Start Date End Date Taking? Authorizing Provider  oxyCODONE-acetaminophen (PERCOCET/ROXICET) 5-325 MG tablet Take 1-2 tablets by mouth every 6 (six) hours as needed for severe pain. 11/11/20  Yes Regan Lemming, MD  ACCU-CHEK AVIVA PLUS test strip CHECK BLOOD GLUCOSE 3 TIMES DAILY 11/26/19   [name], MD  acetaminophen (TYLENOL) 500 MG tablet Take 1,000 mg by mouth every 6 (six) hours as needed for moderate pain or headache.     [provider]  allopurinol (ZYLOPRIM) 300 MG tablet Take 300 mg by mouth daily.    [provider]  ALPRAZolam Duanne Moron) 1 MG tablet Take 0.5 tablets (0.5 mg total) by mouth at bedtime as needed for sleep. 03/23/16   [name], MD  Alum Hydroxide-Mag Carbonate (GAVISCON PO) Take 4 tablets by mouth daily as needed (acid reflux).     [provider]  Ascorbic Acid (VITAMIN C) 1000 MG tablet Take 1,000 mg by mouth daily.    [provider]  atorvastatin (LIPITOR) 20 MG tablet Take  1  tablet  3 x /week  for Cholesterol 04/21/20   [name], MD  azelastine (ASTELIN) 0.1 % nasal spray Place 2 sprays into both nostrils 2 (two) times daily as needed for rhinitis or allergies. 04/30/20   [name], MD  B Complex Vitamins (VITAMIN B COMPLEX PO) Take 1 tablet by mouth at bedtime.     [provider]  bacitracin 500 UNIT/GM ointment  Apply 1 application topically daily as needed for wound care.    [provider]  Blood Glucose Monitoring Suppl (ACCU-CHEK AVIVA PLUS) w/Device KIT Check blood sugar 1 time  daily 07/12/15   [name], MD  Blood Glucose Monitoring Suppl DEVI Test blood sugar up to three times a day or as directed. 05/24/17   Liane Comber, NP  carvedilol (COREG) 6.25 MG tablet TAKE ONE-HALF TABLET BY  MOUTH IN THE MORNING AND 1  TABLET BY MOUTH IN THE  EVENING 07/15/20   Bensimhon, Shaune Pascal, MD  Cholecalciferol (VITAMIN D PO) Take 5,000 Units by mouth daily.     [provider]  colchicine 0.6 MG tablet Take 0.6 mg by mouth every other day.    [provider]  Continuous Blood Gluc Receiver (DEXCOM G6 RECEIVER) DEVI Use to check blood sugar multiple times a day for Hypoglycemia and Hyperglycemia 08/19/20   [name], MD  Continuous Blood Gluc Sensor (DEXCOM G6 SENSOR) MISC Use to check blood sugar multiple times a day for Hypoglycemia and Hyperglycemia 08/19/20   [name], MD  Continuous Blood Gluc Transmit (DEXCOM G6 TRANSMITTER) MISC Use to check blood sugar multiple times a day for Hypoglycemia and Hyperglycemia 08/19/20   [name], MD  cyclobenzaprine (FLEXERIL) 10 MG tablet Take 1/2 to 1 tablet 3 x /day as needed for Muscle Spasm 10/30/20   [name], MD  dapagliflozin propanediol (FARXIGA) 5 MG TABS tablet Take 1 tablet (5 mg total) by mouth daily before breakfast. 07/04/20   Bensimhon, Shaune Pascal, MD  dexamethasone (DECADRON) 4 MG tablet Take 1 tab 3 x day - 3 days, then 2 x day - 3 days, then 1  tab daily 10/30/20   [name], MD  diphenhydrAMINE (BENADRYL) 25 MG tablet Take 25 mg by mouth as needed.    [provider]  fluticasone (FLONASE) 50 MCG/ACT nasal spray Place 1 spray into both nostrils daily as needed for allergies. 03/14/20   [name], MD  gabapentin (NEURONTIN) 600 MG tablet Take  1/2 to 1 tablet  2 to 3 x /Daily  as needed for Pain 11/08/20   [name], MD  HYDROcodone-acetaminophen (NORCO/VICODIN) 5-325 MG tablet 1 tablet as needed    [provider]  insulin NPH-regular Human (70-30) 100 UNIT/ML injection Inject 45 Units into the skin daily with breakfast. 25units in the evening    [provider]  loratadine (CLARITIN) 10 MG tablet Take 10 mg by mouth daily as needed for allergies.     [provider]  metolazone (ZAROXOLYN) 2.5 MG tablet Take 2.5 mg by mouth daily. Only as needed for CHF related weight gain    [provider]  metolazone (ZAROXOLYN) 5 MG tablet 1 tablet as needed    [provider]  Multiple Vitamins-Minerals (PRESERVISION AREDS 2) CAPS Take 1 capsule by mouth 2 (two) times daily.     [provider]  oxyCODONE (ROXICODONE) 5 MG immediate release tablet Take 1 tablet (5 mg total) by mouth every 6 (six) hours as needed for up to 10 doses for severe pain. 11/02/20   Wyvonnia Dusky, MD  polyvinyl alcohol (LIQUIFILM TEARS) 1.4 % ophthalmic solution Place 1 drop into both eyes daily as needed for dry eyes.    [provider]  potassium chloride SA (KLOR-CON) 20 MEQ tablet TAKE 2 TABLETS BY MOUTH 3  TIMES DAILY FOR POTASSIUM 03/16/20   Liane Comber, NP  senna-docusate (SENOKOT-S) 8.6-50 MG tablet Take 1  tablet by mouth daily as needed for up to 20 doses for mild constipation. Take with roxicodone to prevent constipation 11/02/20   Wyvonnia Dusky, MD  sertraline (ZOLOFT) 100 MG tablet TAKE 1 TABLET BY MOUTH  DAILY FOR MOOD 03/18/20   Bensimhon, Shaune Pascal, MD  torsemide  (DEMADEX) 100 MG tablet Take 150 mg by mouth daily. 1.5 tablet    [provider]  trimethoprim-polymyxin b (POLYTRIM) ophthalmic solution Only when he has eye injections 07/03/20   [provider]  warfarin (COUMADIN) 10 MG tablet Take 10 mg by mouth 2 (two) times a week.    [provider]  warfarin (COUMADIN) 5 MG tablet Take    1.5 tablets /day      or as directed Patient taking differently: Take 1.5 tablets by mouth x 5 days weekly 12/11/19   [name], MD  zinc gluconate 50 MG tablet Take 50 mg by mouth daily.    [provider]    Physical Exam: Vitals:   11/11/20 1105 11/11/20 1157 11/11/20 1308 11/11/20 1624  BP: (!) 137/96  (!) 143/92 (!) 168/107  Pulse: 63  69 76  Resp: 17  18 16   Temp: 98.3 F (36.8 C)     TempSrc: Oral     SpO2: 97%  97% 98%  Weight:  78 kg    Height:  5' 11"  (1.803 m)      Constitutional: elderly gentleman, not in distress.  Vitals:   11/11/20 1105 11/11/20 1157 11/11/20 1308 11/11/20 1624  BP: (!) 137/96  (!) 143/92 (!) 168/107  Pulse: 63  69 76  Resp: 17  18 16   Temp: 98.3 F (36.8 C)     TempSrc: Oral     SpO2: 97%  97% 98%  Weight:  78 kg    Height:  5' 11"  (1.803 m)     Eyes: PERRL, lids and conjunctivae normal ENMT: Mucous membranes are dry. Normal dentition.  Neck: normal, supple, no masses, no thyromegaly Respiratory: clear to auscultation bilaterally, no wheezing, no crackles. Normal respiratory effort. No accessory muscle use.  Cardiovascular: Regular rate and rhythm, No extremity edema. 2+ pedal pulses. No carotid bruits.  Abdomen: no tenderness, no masses palpated.  Musculoskeletal: has TLSO brace. No pedal edema.  Skin: no rashes, lesions, ulcers. No induration Neurologic: CN 2-12 grossly intact.  Psychiatric: Normal judgment and insight. Alert and oriented x 3. Normal mood.   (  Labs on Admission: I have personally reviewed following labs and imaging studies  CBC: Recent Labs  Lab  11/07/20 0346 11/07/20 0355 11/11/20 1414  WBC 13.6*  --  16.2*  NEUTROABS 12.0*  --  14.2*  HGB 13.3 13.9 13.3  HCT 41.1 41.0 41.2  MCV 95.1  --  96.5  PLT 162  --  024    Basic Metabolic Panel: Recent Labs  Lab 11/07/20 0346 11/07/20 0355 11/11/20 1414  NA 138 140 137  K 3.5 3.5 4.8  CL 101 99 107  CO2 26  --  23  GLUCOSE 60* 66* 71  BUN 33* 37* 41*  CREATININE 1.76* 1.90* 1.52*  CALCIUM 8.5*  --  8.7*    GFR: Estimated Creatinine Clearance: 40.6 mL/min (A) (by C-G formula based on SCr of 1.52 mg/dL (H)).  Liver Function Tests: Recent Labs  Lab 11/07/20 0346  AST 19  ALT 26  ALKPHOS 159*  BILITOT 1.1  PROT 6.2*  ALBUMIN 3.4*    Urine analysis:    Component Value  Date/Time   COLORURINE YELLOW 11/07/2020 0346   APPEARANCEUR CLEAR 11/07/2020 0346   LABSPEC 1.011 11/07/2020 0346   PHURINE 5.0 11/07/2020 0346   GLUCOSEU 150 (A) 11/07/2020 0346   GLUCOSEU 500 06/04/2010 1626   HGBUR NEGATIVE 11/07/2020 0346   BILIRUBINUR NEGATIVE 11/07/2020 0346   KETONESUR NEGATIVE 11/07/2020 0346   PROTEINUR NEGATIVE 11/07/2020 0346   UROBILINOGEN 0.2 03/05/2014 1627   NITRITE NEGATIVE 11/07/2020 0346   LEUKOCYTESUR NEGATIVE 11/07/2020 0346    Radiological Exams on Admission: CT HEAD WO CONTRAST (5MM)  Result Date: 11/11/2020 CLINICAL DATA:  Status post fall from bed last night. Posterior neck pain. Generalized weakness. EXAM: CT HEAD WITHOUT CONTRAST CT CERVICAL SPINE WITHOUT CONTRAST TECHNIQUE: Multidetector CT imaging of the head and cervical spine was performed following the standard protocol without intravenous contrast. Multiplanar CT image reconstructions of the cervical spine were also generated. COMPARISON:  None. FINDINGS: CT HEAD FINDINGS Brain: There is no evidence for acute hemorrhage, hydrocephalus, mass lesion, or abnormal extra-axial fluid collection. No definite CT evidence for acute infarction. Diffuse loss of parenchymal volume is consistent with  atrophy. Patchy low attenuation in the deep hemispheric and periventricular white matter is nonspecific, but likely reflects chronic microvascular ischemic demyelination. Vascular: No hyperdense vessel or unexpected calcification. Skull: No evidence for fracture. No worrisome lytic or sclerotic lesion. Sinuses/Orbits: Chronic mucosal disease noted in the left frontal, left ethmoid, and left maxillary sinuses, potentially related to a ostiomeatal complex disease. Mastoid air cells are clear bilaterally. Visualized portions of the globes and intraorbital fat are unremarkable. Other: None. CT CERVICAL SPINE FINDINGS Alignment: Straightening of normal cervical lordosis evident. Trace anterolisthesis of C4 on 5 likely related to the loss of facet space at the same level. Skull base and vertebrae: No acute fracture. No primary bone lesion or focal pathologic process. Soft tissues and spinal canal: No prevertebral fluid or swelling. No visible canal hematoma. Disc levels: Loss of disc height noted C5-6 and C6-7 as well as C7-T1. Upper chest: Pleural thickening noted medial left lung apex (84/4). Other: None. IMPRESSION: 1. No acute intracranial abnormality. Atrophy with chronic small vessel white matter ischemic disease. 2. 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 No evidence for cervical spine fracture. Loss of normal cervical lordosis. This can be related to patient positioning, muscle spasm or soft tissue injury. 4. Degenerative disc changes in the cervical spine. 5. Pleural thickening medial left lung apex. This should be further assessed with dedicated CT chest without contrast. Electronically Signed   By: Misty Stanley M.D.   On: 11/11/2020 13:02   CT Cervical Spine Wo Contrast  Result Date: 11/11/2020 CLINICAL DATA:  Status post fall from bed last night. Posterior neck pain. Generalized weakness. EXAM: CT HEAD WITHOUT CONTRAST CT CERVICAL SPINE WITHOUT CONTRAST  TECHNIQUE: Multidetector CT imaging of the head and cervical spine was performed following the standard protocol without intravenous contrast. Multiplanar CT image reconstructions of the cervical spine were also generated. COMPARISON:  None. FINDINGS: CT HEAD FINDINGS Brain: There is no evidence for acute hemorrhage, hydrocephalus, mass lesion, or abnormal extra-axial fluid collection. No definite CT evidence for acute infarction. Diffuse loss of parenchymal volume is consistent with atrophy. Patchy low attenuation in the deep hemispheric and periventricular white matter is nonspecific, but likely reflects chronic microvascular ischemic demyelination. Vascular: No hyperdense vessel or unexpected calcification. Skull: No evidence for fracture. No worrisome lytic or sclerotic lesion. Sinuses/Orbits: Chronic mucosal disease noted in the left frontal, left ethmoid,  and left maxillary sinuses, potentially related to a ostiomeatal complex disease. Mastoid air cells are clear bilaterally. Visualized portions of the globes and intraorbital fat are unremarkable. Other: None. CT CERVICAL SPINE FINDINGS Alignment: Straightening of normal cervical lordosis evident. Trace anterolisthesis of C4 on 5 likely related to the loss of facet space at the same level. Skull base and vertebrae: No acute fracture. No primary bone lesion or focal pathologic process. Soft tissues and spinal canal: No prevertebral fluid or swelling. No visible canal hematoma. Disc levels: Loss of disc height noted C5-6 and C6-7 as well as C7-T1. Upper chest: Pleural thickening noted medial left lung apex (84/4). Other: None. IMPRESSION: 1. No acute intracranial abnormality. Atrophy with chronic small vessel white matter ischemic disease. 2. 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 No evidence for cervical spine fracture. Loss of normal cervical lordosis. This can be related to patient positioning, muscle spasm  or soft tissue injury. 4. Degenerative disc changes in the cervical spine. 5. Pleural thickening medial left lung apex. This should be further assessed with dedicated CT chest without contrast. Electronically Signed   By: Misty Stanley M.D.   On: 11/11/2020 13:02   CT Lumbar Spine Wo Contrast  Result Date: 11/11/2020 CLINICAL DATA:  Recent L2 vertebral body fracture, fall last night EXAM: CT LUMBAR SPINE WITHOUT CONTRAST TECHNIQUE: Multidetector CT imaging of the lumbar spine was performed without intravenous contrast administration. Multiplanar CT image reconstructions were also generated. COMPARISON:  CT examination dated November 02, 2020 FINDINGS: Segmentation: 5 lumbar type vertebrae. Alignment: Grade 1 anterolisthesis of L4 Vertebrae: Interval progression of the compression deformity of L 2 vertebral body now approximately 60% vertebral body height loss. There is approximately 2 mm retropulsion into the spinal canal of the superior endplate. Diffuse osteopenia. No aggressive osseous. Paraspinal and other soft tissues: Mild soft tissue swelling tissue edema on the anterior aspect of the L2 vertebral body Disc levels: Mild multilevel degenerate disc disease with disc protrusion prominent at L4 and L5 with associated facet joint arthropathy. IMPRESSION: 1. Interval progression of compression deformity of L2 vertebral body now approximately 60% vertebral body height loss and with approximately 2 mm retropulsion into the spinal canal of the superior endplate. 2.  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 Electronically Signed   By: Keane Police D.O.   On: 11/11/2020 13:36   DG Hip Unilat W or Wo Pelvis 2-3 Views Left  Result Date: 11/11/2020 CLINICAL DATA:  Status post fall. EXAM: DG HIP (WITH OR WITHOUT PELVIS) 2-3V LEFT COMPARISON:  None. FINDINGS: There is no evidence of hip fracture or dislocation. 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 IMPRESSION: 1. No acute  findings. 2. Mild bilateral and symmetric osteoarthritis. Electronically Signed   By: Kerby Moors M.D.   On: 11/11/2020 12:53    EKG: Independently reviewed. Junctional rhythm with RBBB.   Assessment/Plan Active Problems:   Closed compression fracture of L2 lumbar vertebra, initial encounter (HCC)    Compression fracture of the L2 Lumbar vertebra with loss of 60% vertebral body height and with approximately 2 mm retropulsion into the spinal canal of the superior endplate. No bowel and bladder incontinence.  On TLSO brace and on IV morphine and 10 mg of oxycodone every 4 hours.  IR consulted by EDP.    Hypertension:  BP parameters are optimal.    Atrial fibrillation s/p MAZE. Followed by Atrial flutter s/p DCCV Currently rate controlled and on anti coagulation/Coumadin, which is held  for possibly kyphoplasty.  INR is supra therapeutic.    Hyperlipidemia: Resume statin.    Stage 3 CKD:  Creatinine appears to be at baseline.    Chronic diastolic hart failure:/ Pulmonary artery hypertension  He appears to be compensated.  Resume torsemide and rest of the home meds.  Last echocardiogram reviewed.    Leukocytosis:  Unclear etiology. Reactive. ? Get UA, RPT CXR.     Type 2 DM, insulin dependent. With hyperglycemia: CBG (last 3)  No results for input(s): GLUCAP in the last 72 hours. Resume Novolog 70/30 45 units in am and 25 units at bedside.    GERD: Stable.     DVT prophylaxis: On coumadin  Code Status:   Full code. Family Communication: none at bedside.  Disposition Plan:   Patient is from:  Home.   Anticipated DC to:  Home.   Anticipated DC date:  1 to 2 days.   Anticipated DC barriers: Pending kyphoplasty.   Consults called:  IR, PT Admission status:  OBS/MED SURG  Severity of Illness: The appropriate patient status for this patient is OBSERVATION. Observation status is judged to be reasonable and necessary in order to provide the required intensity  of service to ensure the patient's safety. The patient's presenting symptoms, physical exam findings, and initial radiographic and laboratory data in the context of their medical condition is felt to place them at decreased risk for further clinical deterioration. Furthermore, it is anticipated that the patient will be medically stable for discharge from the hospital within 2 midnights of admission.     Hosie Poisson MD [facility]  How to contact the [facility] Attending or Consulting provider [place] or covering provider during after hours [place], for this patient?   Check the care team in [facility] and look for a) attending/consulting [facility] provider listed and b) the [facility] team listed Log into [email or web address] and use [facility]'s universal password to access. If you do not have the password, please contact the hospital operator. Locate the [facility] provider you are looking for under [facility] and page to a number that you can be directly reached. If you still have difficulty reaching the provider, please page the [facility] (Director on Call) for the Hospitalists listed on amion for assistance.  11/11/2020, 5:49 PM

## 2020-11-11 NOTE — ED Provider Notes (Signed)
Emergency Medicine Provider Triage Evaluation Note  [name]. , a 80 y.o. male  was evaluated in triage.  Pt complains of lumbar back pain, right thigh pain and fall.  Patient was seen on 11/02/2020 and found to have L2 fracture.  Patient reports that he fell yesterday evening while trying to get into bed.  Patient relates landing on his buttocks.  Denies hitting his head or any loss of consciousness.  Patient is on the blood thinner warfarin.  Patient denies any numbness, weakness, saddle anesthesia, facial asymmetry, dysarthria, bowel or bladder dysfunction.  Review of Systems  Positive: Back pain, myalgia Negative: Dysuria, hematuria, abdominal pain, nausea, vomiting, fever, chills  Physical Exam  BP (!) 137/96 (BP Location: Left Arm)   Pulse 63   Temp 98.3 F (36.8 C) (Oral)   Resp 17   SpO2 97%  Gen:   Awake, no distress   Resp:  Normal effort  MSK:   Moves extremities without difficulty  Other:  Patient is wearing TLSO brace.  CN II through XII intact.  Patient moves all limbs equally without difficulty.  Grip strength equal.  Medical Decision Making  Medically screening exam initiated at 11:57 AM.  Appropriate orders placed.  [name]. was informed that the remainder of the evaluation will be completed by another provider, this initial triage assessment does not replace that evaluation, and the importance of remaining in the ED until their evaluation is complete.  Due to fall on thinners will obtain noncontrast head and cervical spine CT.  Will repeat image patient lumbar spine due to new injury.  Will obtain x-ray imaging of left hip.   Cory Beckwith, PA-C 11/11/20 1200    Cory Lemming, MD 11/11/20 1246

## 2020-11-11 NOTE — ED Provider Notes (Signed)
Patient seen after prior EDP.  Patient with continued significant pain in his lumbar spine.  Patient's CT shows worsening of his L2 compression fracture.  Patient would likely benefit from admission for pain control.  Patient's case discussed briefly with IR -Dr. Maryelizabeth Kaufmann.  IR would be happy to consult on patient for possible kyphoplasty (he requested that official consult be placed by the admitting team).  Hospitalist service is aware of case.   Valarie Merino, MD 11/11/20 [phone]

## 2020-11-11 NOTE — ED Triage Notes (Signed)
Ems brings pt in from home for back pain. Pt has a known L1 fracture. No new injury, only complains of back pain.

## 2020-11-12 ENCOUNTER — Encounter (HOSPITAL_COMMUNITY): Payer: Self-pay | Admitting: Internal Medicine

## 2020-11-12 ENCOUNTER — Other Ambulatory Visit: Payer: Self-pay

## 2020-11-12 DIAGNOSIS — I130 Hypertensive heart and chronic kidney disease with heart failure and stage 1 through stage 4 chronic kidney disease, or unspecified chronic kidney disease: Secondary | ICD-10-CM | POA: Diagnosis present

## 2020-11-12 DIAGNOSIS — E876 Hypokalemia: Secondary | ICD-10-CM | POA: Diagnosis present

## 2020-11-12 DIAGNOSIS — E785 Hyperlipidemia, unspecified: Secondary | ICD-10-CM | POA: Diagnosis present

## 2020-11-12 DIAGNOSIS — I482 Chronic atrial fibrillation, unspecified: Secondary | ICD-10-CM | POA: Diagnosis not present

## 2020-11-12 DIAGNOSIS — I484 Atypical atrial flutter: Secondary | ICD-10-CM | POA: Diagnosis present

## 2020-11-12 DIAGNOSIS — T45515A Adverse effect of anticoagulants, initial encounter: Secondary | ICD-10-CM | POA: Diagnosis present

## 2020-11-12 DIAGNOSIS — E1169 Type 2 diabetes mellitus with other specified complication: Secondary | ICD-10-CM | POA: Diagnosis present

## 2020-11-12 DIAGNOSIS — E1122 Type 2 diabetes mellitus with diabetic chronic kidney disease: Secondary | ICD-10-CM | POA: Diagnosis not present

## 2020-11-12 DIAGNOSIS — I2510 Atherosclerotic heart disease of native coronary artery without angina pectoris: Secondary | ICD-10-CM | POA: Diagnosis present

## 2020-11-12 DIAGNOSIS — E1165 Type 2 diabetes mellitus with hyperglycemia: Secondary | ICD-10-CM | POA: Diagnosis present

## 2020-11-12 DIAGNOSIS — N183 Chronic kidney disease, stage 3 unspecified: Secondary | ICD-10-CM | POA: Diagnosis not present

## 2020-11-12 DIAGNOSIS — I2721 Secondary pulmonary arterial hypertension: Secondary | ICD-10-CM | POA: Diagnosis present

## 2020-11-12 DIAGNOSIS — K219 Gastro-esophageal reflux disease without esophagitis: Secondary | ICD-10-CM | POA: Diagnosis present

## 2020-11-12 DIAGNOSIS — I5032 Chronic diastolic (congestive) heart failure: Secondary | ICD-10-CM | POA: Diagnosis not present

## 2020-11-12 DIAGNOSIS — E1140 Type 2 diabetes mellitus with diabetic neuropathy, unspecified: Secondary | ICD-10-CM | POA: Diagnosis present

## 2020-11-12 DIAGNOSIS — S32029A Unspecified fracture of second lumbar vertebra, initial encounter for closed fracture: Secondary | ICD-10-CM | POA: Diagnosis present

## 2020-11-12 DIAGNOSIS — Z20822 Contact with and (suspected) exposure to covid-19: Secondary | ICD-10-CM | POA: Diagnosis present

## 2020-11-12 DIAGNOSIS — I10 Essential (primary) hypertension: Secondary | ICD-10-CM | POA: Diagnosis not present

## 2020-11-12 DIAGNOSIS — R296 Repeated falls: Secondary | ICD-10-CM | POA: Diagnosis present

## 2020-11-12 DIAGNOSIS — D72829 Elevated white blood cell count, unspecified: Secondary | ICD-10-CM | POA: Diagnosis present

## 2020-11-12 DIAGNOSIS — G4733 Obstructive sleep apnea (adult) (pediatric): Secondary | ICD-10-CM | POA: Diagnosis present

## 2020-11-12 DIAGNOSIS — N1831 Chronic kidney disease, stage 3a: Secondary | ICD-10-CM | POA: Diagnosis present

## 2020-11-12 DIAGNOSIS — I43 Cardiomyopathy in diseases classified elsewhere: Secondary | ICD-10-CM | POA: Diagnosis present

## 2020-11-12 DIAGNOSIS — F419 Anxiety disorder, unspecified: Secondary | ICD-10-CM | POA: Diagnosis present

## 2020-11-12 DIAGNOSIS — I4819 Other persistent atrial fibrillation: Secondary | ICD-10-CM | POA: Diagnosis present

## 2020-11-12 DIAGNOSIS — F32A Depression, unspecified: Secondary | ICD-10-CM | POA: Diagnosis present

## 2020-11-12 DIAGNOSIS — W1830XA Fall on same level, unspecified, initial encounter: Secondary | ICD-10-CM | POA: Diagnosis present

## 2020-11-12 DIAGNOSIS — S32020A Wedge compression fracture of second lumbar vertebra, initial encounter for closed fracture: Secondary | ICD-10-CM | POA: Diagnosis not present

## 2020-11-12 DIAGNOSIS — R791 Abnormal coagulation profile: Secondary | ICD-10-CM | POA: Diagnosis present

## 2020-11-12 LAB — CBC
HCT: 39.6 % (ref 39.0–52.0)
Hemoglobin: 12.5 g/dL — ABNORMAL LOW (ref 13.0–17.0)
MCH: 30.6 pg (ref 26.0–34.0)
MCHC: 31.6 g/dL (ref 30.0–36.0)
MCV: 97.1 fL (ref 80.0–100.0)
Platelets: 175 10*3/uL (ref 150–400)
RBC: 4.08 MIL/uL — ABNORMAL LOW (ref 4.22–5.81)
RDW: 16.8 % — ABNORMAL HIGH (ref 11.5–15.5)
WBC: 10.9 10*3/uL — ABNORMAL HIGH (ref 4.0–10.5)
nRBC: 0 % (ref 0.0–0.2)

## 2020-11-12 LAB — COMPREHENSIVE METABOLIC PANEL
ALT: 27 U/L (ref 0–44)
AST: 19 U/L (ref 15–41)
Albumin: 3.4 g/dL — ABNORMAL LOW (ref 3.5–5.0)
Alkaline Phosphatase: 195 U/L — ABNORMAL HIGH (ref 38–126)
Anion gap: 11 (ref 5–15)
BUN: 38 mg/dL — ABNORMAL HIGH (ref 8–23)
CO2: 17 mmol/L — ABNORMAL LOW (ref 22–32)
Calcium: 8.3 mg/dL — ABNORMAL LOW (ref 8.9–10.3)
Chloride: 108 mmol/L (ref 98–111)
Creatinine, Ser: 1.34 mg/dL — ABNORMAL HIGH (ref 0.61–1.24)
GFR, Estimated: 53 mL/min — ABNORMAL LOW (ref >60)
Glucose, Bld: 111 mg/dL — ABNORMAL HIGH (ref 70–99)
Potassium: 4.6 mmol/L (ref 3.5–5.1)
Sodium: 136 mmol/L (ref 135–145)
Total Bilirubin: 1.3 mg/dL — ABNORMAL HIGH (ref 0.3–1.2)
Total Protein: 6.2 g/dL — ABNORMAL LOW (ref 6.5–8.1)

## 2020-11-12 LAB — GLUCOSE, CAPILLARY
Glucose-Capillary: 102 mg/dL — ABNORMAL HIGH (ref 70–99)
Glucose-Capillary: 33 mg/dL — CL (ref 70–99)
Glucose-Capillary: 40 mg/dL — CL (ref 70–99)
Glucose-Capillary: 54 mg/dL — ABNORMAL LOW (ref 70–99)
Glucose-Capillary: 96 mg/dL (ref 70–99)
Glucose-Capillary: 129 mg/dL — ABNORMAL HIGH (ref 70–99)
Glucose-Capillary: 162 mg/dL — ABNORMAL HIGH (ref 70–99)

## 2020-11-12 LAB — URINALYSIS, ROUTINE W REFLEX MICROSCOPIC
Bilirubin Urine: NEGATIVE
Glucose, UA: 150 mg/dL — AB
Hgb urine dipstick: NEGATIVE
Ketones, ur: NEGATIVE mg/dL
Leukocytes,Ua: NEGATIVE
Nitrite: NEGATIVE
Protein, ur: NEGATIVE mg/dL
Specific Gravity, Urine: 1.017 (ref 1.005–1.030)
pH: 5 (ref 5.0–8.0)

## 2020-11-12 LAB — PROTIME-INR
INR: 3.4 — ABNORMAL HIGH (ref 0.8–1.2)
INR: 2.3 — ABNORMAL HIGH (ref 0.8–1.2)
Prothrombin Time: 34.6 seconds — ABNORMAL HIGH (ref 11.4–15.2)
Prothrombin Time: 25.2 seconds — ABNORMAL HIGH (ref 11.4–15.2)

## 2020-11-12 MED ORDER — CARVEDILOL 3.125 MG PO TABS
1.5625 mg | ORAL_TABLET | Freq: Two times a day (BID) | ORAL | Status: DC
  Administered 2020-11-12 – 2020-11-16 (×9): 1.5625 mg via ORAL
  Filled 2020-11-12 (×9): qty 1

## 2020-11-12 MED ORDER — CEFAZOLIN SODIUM-DEXTROSE 2-4 GM/100ML-% IV SOLN: 2.0000 g | INTRAVENOUS | Status: DC

## 2020-11-12 MED ORDER — GLUCOSE 40 % PO GEL
ORAL | Status: AC
  Filled 2020-11-12: qty 1.21

## 2020-11-12 MED ORDER — VITAMIN K1 10 MG/ML IJ SOLN
5.0000 mg | Freq: Once | INTRAVENOUS | Status: AC
  Administered 2020-11-12: 5 mg via INTRAVENOUS
  Filled 2020-11-12: qty 0.5

## 2020-11-12 NOTE — Progress Notes (Signed)
OT Cancellation Note  Patient Details Name: [name]. MRN: [id number] DOB: 1940/05/29   Cancelled Treatment:    Reason Eval/Treat Not Completed: Patient not medically ready: Noted chart with orders for IR workup for possible kyphoplasty as well as neurosurgery consult. Will hold therapy until pt seen by consults, and resume once pt is medically cleared for OT.    Julien Girt 11/12/2020, 11:37 AM

## 2020-11-12 NOTE — Progress Notes (Signed)
PT Cancellation Note  Patient Details Name: [name]. MRN: [id number] DOB: 1940/09/30   Cancelled Treatment:    Reason Eval/Treat Not Completed: Medical issues which prohibited therapy (Imaging showed a 2 mm retropulsion into spinal canal. Will await neuro consult prior to initiating mobility.)   Philomena Doheny PT 11/12/2020  Acute Rehabilitation Services Pager [phone] Office [phone]

## 2020-11-12 NOTE — Plan of Care (Signed)
Plan of care reviewed and discussed. °

## 2020-11-12 NOTE — Consult Note (Addendum)
Chief Complaint: Patient was seen in consultation today for image guided kyphoplasty  Chief Complaint  Patient presents with   Back Pain   at the request of  Hosie Poisson.   Referring Physician(s): Hosie Poisson.   Supervising Physician: Mir, Sharen Heck  Patient Status: [facility] - In-pt  History of Present Illness: [name]. is a 80 y.o. male with PMHs of HTN, CAD s/p CABG x 2 in 2015, a flutter s/p MAZE and DCCV in 2015 on coumadin, CHF, CKD stage III, DM type 2, recent GI bleed due to gastric ans small bowel AVMs s/p EGD and ablation with GI on 06/12/20, compression fx on L2 due to fall on 10/29 treated with TLSO brace, who presented to [facility] ED on 11/7 due to back pain after fall on 11/6, CT lumbar on 11/7 showed:  Interval progression of compression deformity of L2 vertebral body now approximately 60% vertebral body height loss and with approximately 2 mm retropulsion into the spinal canal of the superior endplate.  Patient is currently hospitalized for pain control, and IR was requested for image guided kyphoplasty for the L 2 compression fx.  Case was reviewed and approved by Dr. Dwaine Gale.  Patient laying in bed, not in acute distress.  Patient states that his back pain in 8-9 of 10, worsens with movement. Pain medicines do not help with the pain much.  The pain is limiting daily activities.  Denise headache, fever, chills, shortness of breath, cough, chest pain, abdominal pain, nausea ,vomiting, and bleeding.   Past Medical History:  Diagnosis Date   Adrenal adenoma    Arthritis    Atypical atrial flutter ([place]) 8/15, 10/15   a. DCCV 08/2013. b. s/p RFA 10/2013.   Basal cell carcinoma    CAD (coronary artery disease)    a. 04/2013 CABG x 2: LIMA to LAD, SVG to RI, EVH via R thigh.   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 s/p MAZE 04/2013 in setting of CABG. b. Amio stopped in 10/2013 after flutter ablation.   PFO (patent foramen ovale)    a. Small PFO by TEE 10/2013.   Pleural effusion, left    a. s/p thoracentesis 05/2013.   Respiratory failure ([place])    a. Hypoxia 10/2013 - required supp O2 as inpatient, did not require it at discharge.   S/P Maze operation for atrial fibrillation    a. 04/2013: Complete bilateral atrial lesion set using cryothermy and bipolar radiofrequency ablation with clipping of LA appendage (@ time of CABG)    Past Surgical History:  Procedure Laterality Date   APPENDECTOMY  03/05/2017   laproscopic   ATRIAL FIBRILLATION ABLATION N/A 10/26/2013   Procedure: ATRIAL FIBRILLATION  ABLATION;  Surgeon: Coralyn Mark, MD;  Location: [facility] CATH LAB;  Service: Cardiovascular;  Laterality: N/A;   BASAL CELL CARCINOMA EXCISION     x3 on face   BIOPSY  06/12/2020   Procedure: BIOPSY;  Surgeon: Jerene Bears, MD;  Location: [facility] ENDOSCOPY;  Service: Gastroenterology;;   CARDIAC CATHETERIZATION     myocardial bridge but no cad   CARDIOVERSION N/A 08/23/2013   Procedure: CARDIOVERSION;  Surgeon: Sanda Klein, MD;  Location: [place];  Service: Cardiovascular;  Laterality: N/A;   CARPOMETACARPEL SUSPENSION PLASTY Left 02/14/2014   Procedure: CARPOMETACARPEL ([place]) SUSPENSIONPLASTY THUMB  WITH  ABDUCTOR POLLICIS LONGUS TRANSFER AND STENOSING TENOSYNOVITIS RELEASE LEFT WRIST;   Surgeon: Charlotte Crumb, MD;  Location: [place];  Service: Orthopedics;  Laterality: Left;   CATARACT EXTRACTION Bilateral    COLONOSCOPY WITH PROPOFOL N/A 01/26/2017   Procedure: COLONOSCOPY WITH PROPOFOL;  Surgeon: Doran Stabler, MD;  Location: [facility] ENDOSCOPY;  Service: Gastroenterology;  Laterality: N/A;   CORONARY ARTERY BYPASS GRAFT N/A 04/05/2013   Procedure: CORONARY ARTERY BYPASS GRAFTING (CABG) TIMES TWO USING LEFT INTERNAL MAMMARY ARTERY AND RIGHT SAPHENOUS LEG VEIN HARVESTED ENDOSCOPICALLY;  Surgeon: Rexene Alberts, MD;  Location: [place];  Service: Open Heart Surgery;  Laterality: N/A;   ENTEROSCOPY N/A 06/12/2020   Procedure: ENTEROSCOPY;  Surgeon: Jerene Bears, MD;  Location: [facility] ENDOSCOPY;  Service: Gastroenterology;  Laterality: N/A;   ESOPHAGOGASTRODUODENOSCOPY (EGD) WITH PROPOFOL N/A 01/26/2017   Procedure: ESOPHAGOGASTRODUODENOSCOPY (EGD) WITH PROPOFOL;  Surgeon: Doran Stabler, MD;  Location: [facility] ENDOS[facility];  Service: Gastroenterology;  Laterality: N/A;   EYE SURGERY Bilateral    Cat Sx   EYE SURGERY Right    RD repair - SB   GREAT TOE ARTHRODESIS, INTERPHALANGEAL JOINT     Right foot   HOT HEMOSTASIS N/A 06/12/2020   Procedure: HOT HEMOSTASIS (ARGON PLASMA COAGULATION/BICAP);  Surgeon: Jerene Bears, MD;  Location: [facility];  Service: Gastroenterology;  Laterality: N/A;   INTRAOPERATIVE TRANSESOPHAGEAL ECHOCARDIOGRAM N/A 04/05/2013   Procedure: INTRAOPERATIVE TRANSESOPHAGEAL ECHOCARDIOGRAM;  Surgeon: Rexene Alberts, MD;  Location: [place];  Service: Open Heart Surgery;  Laterality: N/A;   LAPAROSCOPIC APPENDECTOMY N/A 03/05/2017   Procedure: APPENDECTOMY LAPAROSCOPIC;  Surgeon: Judeth Horn, MD;  Location: [place];  Service: General;  Laterality: N/A;   LEFT HEART CATHETERIZATION WITH CORONARY ANGIOGRAM N/A 03/07/2013   Procedure: LEFT HEART CATHETERIZATION WITH CORONARY ANGIOGRAM;  Surgeon: Burnell Blanks, MD;  Location: [facility] CATH LAB;  Service:  Cardiovascular;  Laterality: N/A;   MAZE N/A 04/05/2013   Procedure: MAZE;  Surgeon: Rexene Alberts, MD;  Location: [place];  Service: Open Heart Surgery;  Laterality: N/A;   Polinydal cyst     Removed   POLYPECTOMY     Retina repair-right     RETINAL DETACHMENT SURGERY Right    RD Repair - SB   RIGHT HEART CATH N/A 07/06/2019   Procedure: RIGHT HEART CATH;  Surgeon: Jolaine Artist, MD;  Location: [place] CV LAB;  Service: Cardiovascular;  Laterality: N/A;   RIGHT HEART CATHETERIZATION N/A 05/03/2014   Procedure: RIGHT HEART CATH;  Surgeon: Jolaine Artist, MD;  Location: [facility] CATH LAB;  Service: Cardiovascular;  Laterality: N/A;   TEE WITHOUT CARDIOVERSION N/A 08/23/2013   Procedure: TRANSESOPHAGEAL ECHOCARDIOGRAM (TEE);  Surgeon: Sanda Klein, MD;  Location: [facility] ENDOSCOPY;  Service: Cardiovascular;  Laterality: N/A;   TEE WITHOUT CARDIOVERSION N/A 10/26/2013   Procedure: TRANSESOPHAGEAL ECHOCARDIOGRAM (TEE);  Surgeon: Tressia Miners  Remonia Richter, MD;  Location: [place];  Service: Cardiovascular;  Laterality: N/A;   TEE WITHOUT CARDIOVERSION N/A 06/05/2014   Procedure: TRANSESOPHAGEAL ECHOCARDIOGRAM (TEE);  Surgeon: Thayer Headings, MD;  Location: [facility] ENDOSCOPY;  Service: Cardiovascular;  Laterality: N/A;   TRAPEZIUM RESECTION      Allergies: Sunflower oil, Horse-derived products, Other, Tetanus toxoids, and Tetanus toxoid  Medications: Prior to Admission medications   Medication Sig Start Date End Date Taking? Authorizing Provider  ACCU-CHEK AVIVA PLUS test strip CHECK BLOOD GLUCOSE 3 TIMES DAILY Patient taking differently: 1 each by Other route 3 (three) times daily. Check blood glucose three times daily 11/26/19  Yes [name], MD  acetaminophen (TYLENOL) 500 MG tablet Take 1,000 mg by mouth every 6 (six) hours as needed for moderate pain or headache.    Yes [provider]  allopurinol (ZYLOPRIM) 300 MG tablet Take 300 mg by mouth daily.   Yes [provider]   Ascorbic Acid (VITAMIN C) 1000 MG tablet Take 1,000 mg by mouth daily.   Yes [provider]  azelastine (ASTELIN) 0.1 % nasal spray Place 2 sprays into both nostrils 2 (two) times daily as needed for rhinitis or allergies. 04/30/20  Yes [name], MD  B Complex Vitamins (VITAMIN B COMPLEX PO) Take 1 tablet by mouth at bedtime.    Yes [provider]  carvedilol (COREG) 6.25 MG tablet TAKE ONE-HALF TABLET BY  MOUTH IN THE MORNING AND 1  TABLET BY MOUTH IN THE  EVENING Patient taking differently: Take 3.125-6.25 mg by mouth 2 (two) times daily with a meal. Take 1/2 tablet by mouth in the morning, and 1 whole tablet in the evening 07/15/20  Yes Bensimhon, Shaune Pascal, MD  Cholecalciferol (VITAMIN D PO) Take 5,000 Units by mouth daily.    Yes [provider]  dapagliflozin propanediol (FARXIGA) 5 MG TABS tablet Take 1 tablet (5 mg total) by mouth daily before breakfast. 07/04/20  Yes Bensimhon, Shaune Pascal, MD  gabapentin (NEURONTIN) 600 MG tablet Take  1/2 to 1 tablet  2 to 3 x /Daily  as needed for Pain Patient taking differently: Take 300-600 mg by mouth 3 (three) times daily as needed (pain). 11/08/20  Yes [name], MD  insulin NPH-regular Human (70-30) 100 UNIT/ML injection Inject 45 Units into the skin daily with breakfast. 25units in the evening   Yes [provider]  loratadine (CLARITIN) 10 MG tablet Take 10 mg by mouth daily as needed for allergies.    Yes [provider]  metolazone (ZAROXOLYN) 5 MG tablet Take 5 mg by mouth daily as needed (edema).   Yes [provider]  Multiple Vitamins-Minerals (PRESERVISION AREDS 2) CAPS Take 1 capsule by mouth 2 (two) times daily.    Yes [provider]  oxyCODONE-acetaminophen (PERCOCET/ROXICET) 5-325 MG tablet Take 1-2 tablets by mouth every 6 (six) hours as needed for severe pain. 11/11/20  Yes Regan Lemming, MD  polyvinyl alcohol (LIQUIFILM TEARS) 1.4 % ophthalmic solution Place 1  drop into both eyes daily as needed for dry eyes.   Yes [provider]  potassium chloride SA (KLOR-CON) 20 MEQ tablet TAKE 2 TABLETS BY MOUTH 3  TIMES DAILY FOR POTASSIUM Patient taking differently: Take 60 mEq by mouth 2 (two) times daily. 03/16/20  Yes Liane Comber, NP  senna-docusate (SENOKOT-S) 8.6-50 MG tablet Take 1 tablet by mouth daily as needed for up to 20 doses for mild constipation. Take with roxicodone to prevent constipation 11/02/20  Yes  Wyvonnia Dusky, MD  sertraline (ZOLOFT) 100 MG tablet TAKE 1 TABLET BY MOUTH  DAILY FOR MOOD Patient taking differently: Take 100 mg by mouth daily. 03/18/20  Yes Bensimhon, Shaune Pascal, MD  torsemide (DEMADEX) 100 MG tablet Take 150 mg by mouth daily.   Yes [provider]  warfarin (COUMADIN) 5 MG tablet Take    1.5 tablets /day      or as directed Patient taking differently: Take 7.5-10 mg by mouth See admin instructions. Take 2 tablets (55m) by mouth on Sundays and Thursdays, and 1 and 1/2 tablet(s) (7.532m on all other days. 12/11/19  Yes McUnk PintoMD  zinc gluconate 50 MG tablet Take 50 mg by mouth daily.   Yes [provider]  ALPRAZolam (XDuanne Moron1 MG tablet Take 0.5 tablets (0.5 mg total) by mouth at bedtime as needed for sleep. Patient not taking: Reported on 11/11/2020 03/23/16   McUnk PintoMD  atorvastatin (LIPITOR) 20 MG tablet Take  1 tablet  3 x /week  for Cholesterol Patient taking differently: Take 20 mg by mouth 3 (three) times a week. Take 1 tablet by mouth on Mon/ Wed/ Frid 04/21/20   McUnk PintoMD  Blood Glucose Monitoring Suppl (ACCU-CHEK AVIVA PLUS) w/Device KIT Check blood sugar 1 time  daily Patient taking differently: 1 Device by Other route as directed. 07/12/15   McUnk PintoMD  Blood Glucose Monitoring Suppl DEVI Test blood sugar up to three times a day or as directed. 05/24/17   CoLiane ComberNP  colchicine 0.6 MG tablet Take 0.6 mg by mouth every other day. Patient  not taking: Reported on 11/11/2020    [provider]  Continuous Blood Gluc Receiver (DEGregoryDEVI Use to check blood sugar multiple times a day for Hypoglycemia and Hyperglycemia 08/19/20   McUnk PintoMD  Continuous Blood Gluc Sensor (DEXCOM G6 SENSOR) MISC Use to check blood sugar multiple times a day for Hypoglycemia and Hyperglycemia 08/19/20   McUnk PintoMD  Continuous Blood Gluc Transmit (DEXCOM G6 TRANSMITTER) MISC Use to check blood sugar multiple times a day for Hypoglycemia and Hyperglycemia 08/19/20   McUnk PintoMD  cyclobenzaprine (FLEXERIL) 10 MG tablet Take 1/2 to 1 tablet 3 x /day as needed for Muscle Spasm Patient not taking: Reported on 11/11/2020 10/30/20   McUnk PintoMD  dexamethasone (DECADRON) 4 MG tablet Take 1 tab 3 x day - 3 days, then 2 x day - 3 days, then 1 tab daily Patient not taking: Reported on 11/11/2020 10/30/20   McUnk PintoMD  fluticasone (F[facility]50 MCG/ACT nasal spray Place 1 spray into both nostrils daily as needed for allergies. 03/14/20   McUnk PintoMD  oxyCODONE (ROXICODONE) 5 MG immediate release tablet Take 1 tablet (5 mg total) by mouth every 6 (six) hours as needed for up to 10 doses for severe pain. Patient not taking: Reported on 11/11/2020 11/02/20   TrWyvonnia DuskyMD     Family History  Problem Relation Age of Onset   Dementia Father    Colon cancer Mother        Family History/Uncle    Colon polyps Mother        Family History   Atrial fibrillation Mother    Hypertension Mother    Colon polyps Sister        Family history   Diabetes Maternal Uncle    Stroke Paternal Uncle     Social History   Socioeconomic  History   Marital status: Married    Spouse name: Not on file   Number of children: 1   Years of education: Not on file   Highest education level: Not on file  Occupational History   Occupation: retired Software engineer  Tobacco Use   Smoking status: Former    Packs/day:  4.00    Years: 25.00    Pack years: 100.00    Types: Cigarettes    Quit date: 01/05/1981    Years since quitting: 39.8   Smokeless tobacco: Never  Vaping Use   Vaping Use: Never used  Substance and Sexual Activity   Alcohol use: Yes    Alcohol/week: 1.0 standard drink    Types: 1 Shots of liquor per week    Comment: 1-5 drinks per week   Drug use: No   Sexual activity: Yes  Other Topics Concern   Not on file  Social History Narrative   Daily caffeine-yes   Patient gets regular exercise.   Pt lives in [place] with spouse.  Retired Software engineer.  Social Determinants of Health   Financial Resource Strain: Not on file  Food Insecurity: Not on file  Transportation Needs: No Transportation Needs   Lack of Transportation (Medical): No   Lack of Transportation (Non-Medical): No  Physical Activity: Not on file  Stress:  Not on file  Social Connections: Not on file     Review of Systems: A 12 point ROS discussed and pertinent positives are indicated in the HPI above.  All other systems are negative.  Vital Signs: BP 111/71 (BP Location: Right Arm)   Pulse 79   Temp 98.2 F (36.8 C) (Oral)   Resp 18   Ht _0  (1.803 m)   Wt 172 lb (78 kg)   SpO2 90%   BMI 23.99 kg/m    Physical Exam Vitals reviewed.  Constitutional:      General: He is not in acute distress.    Appearance: Normal appearance. He is not ill-appearing.  HENT:     Head: Normocephalic and atraumatic.     Mouth/Throat:     Mouth: Mucous membranes are moist.  Cardiovascular:     Rate and Rhythm: Normal rate and regular rhythm.     Heart sounds: Normal heart sounds.  Pulmonary:     Effort: Pulmonary effort is normal.     Breath sounds: Normal breath sounds.  Abdominal:     General: Abdomen is flat. Bowel sounds are normal.     Palpations: Abdomen is soft.  Musculoskeletal:     Cervical back: Normal range of motion and neck supple.     Comments: TTP on L2-3  Skin:    General: Skin is warm and dry.     Coloration: Skin is not jaundiced or pale.  Neurological:     Mental Status: He is alert and oriented to person, place, and time.  Psychiatric:        Mood and Affect: Mood normal.        Behavior: Behavior normal.        Judgment: Judgment normal.    MD Evaluation Airway: WNL Heart: WNL Abdomen: WNL Chest/ Lungs: WNL Mallampati/Airway Score: Two  Imaging: CT HEAD WO CONTRAST (5MM)  Result Date: 11/11/2020 CLINICAL DATA:  Status post fall from bed last night. Posterior neck pain. Generalized weakness. EXAM: CT HEAD WITHOUT CONTRAST CT CERVICAL SPINE WITHOUT CONTRAST TECHNIQUE: Multidetector CT imaging of the head and cervical spine was performed following the standard protocol without intravenous contrast. Multiplanar CT image reconstructions of the cervical spine were also generated. COMPARISON:  None. FINDINGS: CT  HEAD FINDINGS Brain: There is no evidence for acute hemorrhage, hydrocephalus, mass lesion, or abnormal extra-axial fluid collection. No definite CT evidence for acute infarction. Diffuse loss of parenchymal volume is consistent with atrophy. Patchy low attenuation in the deep hemispheric and periventricular white matter is nonspecific, but likely reflects chronic microvascular ischemic demyelination. Vascular: No hyperdense vessel or unexpected calcification. Skull: No evidence for fracture. No worrisome lytic or sclerotic lesion. Sinuses/Orbits: Chronic mucosal disease noted in the left frontal, left ethmoid, and left maxillary sinuses, potentially related to a ostiomeatal complex disease. Mastoid air cells are clear bilaterally. Visualized portions of the globes and intraorbital fat are unremarkable. Other: None. CT CERVICAL SPINE FINDINGS Alignment: Straightening of normal cervical lordosis evident. Trace anterolisthesis of C4 on 5 likely related to the loss of facet space at the same level. Skull base and vertebrae: No acute fracture. No primary bone lesion or focal pathologic process. Soft tissues and spinal canal: No prevertebral  fluid or swelling. No visible canal hematoma. Disc levels: Loss of disc height noted C5-6 and C6-7 as well as C7-T1. Upper chest: Pleural thickening noted medial left lung apex (84/4). Other: None. IMPRESSION: 1. No acute intracranial abnormality. Atrophy with chronic small vessel white matter ischemic disease. 2. 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 No evidence for cervical spine fracture. Loss of normal cervical lordosis. This can be related to patient positioning, muscle spasm or soft tissue injury. 4. Degenerative disc changes in the cervical spine. 5. Pleural thickening medial left lung apex. This should be further assessed with dedicated CT chest without contrast. Electronically Signed   By: Misty Stanley M.D.   On:  11/11/2020 13:02   CT Cervical Spine Wo Contrast  Result Date: 11/11/2020 CLINICAL DATA:  Status post fall from bed last night. Posterior neck pain. Generalized weakness. EXAM: CT HEAD WITHOUT CONTRAST CT CERVICAL SPINE WITHOUT CONTRAST TECHNIQUE: Multidetector CT imaging of the head and cervical spine was performed following the standard protocol without intravenous contrast. Multiplanar CT image reconstructions of the cervical spine were also generated. COMPARISON:  None. FINDINGS: CT HEAD FINDINGS Brain: There is no evidence for acute hemorrhage, hydrocephalus, mass lesion, or abnormal extra-axial fluid collection. No definite CT evidence for acute infarction. Diffuse loss of parenchymal volume is consistent with atrophy. Patchy low attenuation in the deep hemispheric and periventricular white matter is nonspecific, but likely reflects chronic microvascular ischemic demyelination. Vascular: No hyperdense vessel or unexpected calcification. Skull: No evidence for fracture. No worrisome lytic or sclerotic lesion. Sinuses/Orbits: Chronic mucosal disease noted in the left frontal, left ethmoid, and left maxillary sinuses, potentially related to a ostiomeatal complex disease. Mastoid air cells are clear bilaterally. Visualized portions of the globes and intraorbital fat are unremarkable. Other: None. CT CERVICAL SPINE FINDINGS Alignment: Straightening of normal cervical lordosis evident. Trace anterolisthesis of C4 on 5 likely related to the loss of facet space at the same level. Skull base and vertebrae: No acute fracture. No primary bone lesion or focal pathologic process. Soft tissues and spinal canal: No prevertebral fluid or swelling. No visible canal hematoma. Disc levels: Loss of disc height noted C5-6 and C6-7 as well as C7-T1. Upper chest: Pleural thickening noted medial left lung apex (84/4). Other: None. IMPRESSION: 1. No acute intracranial abnormality. Atrophy with chronic small vessel white matter  ischemic disease. 2. 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 No evidence for cervical spine fracture. Loss of normal cervical lordosis. This can be related to patient positioning, muscle spasm or soft tissue injury. 4. Degenerative disc changes in the cervical spine. 5. Pleural thickening medial left lung apex. This should be further assessed with dedicated CT chest without contrast. Electronically Signed   By: Misty Stanley M.D.   On: 11/11/2020 13:02   CT Lumbar Spine Wo Contrast  Result Date: 11/11/2020 CLINICAL DATA:  Recent L2 vertebral body fracture, fall last night EXAM: CT LUMBAR SPINE WITHOUT CONTRAST TECHNIQUE: Multidetector CT imaging of the lumbar spine was performed without intravenous contrast administration. Multiplanar CT image reconstructions were also generated. COMPARISON:  CT examination dated November 02, 2020 FINDINGS: Segmentation: 5 lumbar type vertebrae. Alignment: Grade 1 anterolisthesis of L4 Vertebrae: Interval progression of the compression deformity of L 2 vertebral body now approximately 60% vertebral body height loss. There is approximately 2 mm retropulsion into the spinal canal of the superior endplate. Diffuse osteopenia. No aggressive osseous. Paraspinal and other soft tissues: Mild soft  tissue swelling tissue edema on the anterior aspect of the L2 vertebral body Disc levels: Mild multilevel degenerate disc disease with disc protrusion prominent at L4 and L5 with associated facet joint arthropathy. IMPRESSION: 1. Interval progression of compression deformity of L2 vertebral body now approximately 60% vertebral body height loss and with approximately 2 mm retropulsion into the spinal canal of the superior endplate. 2.  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 Electronically Signed   By: Keane Police D.O.   On: 11/11/2020 13:36   CT L-SPINE NO CHARGE  Result Date: 11/02/2020 CLINICAL DATA:  Back pain for 1  week.  No known injury. EXAM: CT LUMBAR SPINE WITHOUT CONTRAST TECHNIQUE: Multidetector CT imaging of the lumbar spine was performed without intravenous contrast administration. Multiplanar CT image reconstructions were also generated. COMPARISON:  08/08/2014 FINDINGS: Segmentation: 5 lumbar type vertebrae. Alignment: Grade 1 anterolisthesis of L4 on L5 secondary to facet disease. Vertebrae: L2 vertebral body compression fracture with approximately 20% central height loss. Generalized osteopenia. No discitis or osteomyelitis. No aggressive osseous lesion. Paraspinal and other soft tissues: Abdominal aortic atherosclerosis. Other: Mild osteoarthritis of bilateral SI joints. Disc levels: Disc spaces: Degenerative disease with mild disc height loss at L1-2. T12-L1: No disc protrusion, foraminal stenosis or central canal stenosis. L1-L2: No disc protrusion, foraminal stenosis or central canal stenosis. L2-L3: Mild broad-based disc bulge. No foraminal or central canal stenosis. L3-L4: Mild broad-based disc bulge. Mild bilateral facet arthropathy. No foraminal or central canal stenosis. L4-L5: Mild broad-based disc bulge. Severe bilateral facet arthropathy. Severe spinal stenosis. No foraminal stenosis. L5-S1: Mild broad-based disc bulge. Mild bilateral facet arthropathy. No foraminal or central canal stenosis. IMPRESSION: 1. Acute L2 vertebral body compression fracture with approximately 20% height loss. Electronically Signed   By: Kathreen Devoid M.D.   On: 11/02/2020 17:13   DG Chest Port 1 View  Result Date: 11/07/2020 CLINICAL DATA:  Weakness, hypoglycemia EXAM: PORTABLE CHEST 1 VIEW COMPARISON:  06/09/2020 FINDINGS: Cardiomegaly. Chronic pulmonary vascular congestion/cephalization. No focal consolidation. No pleural effusion or pneumothorax. Postsurgical changes related to prior CABG.  Median sternotomy. IMPRESSION: No evidence of acute cardiopulmonary disease. Electronically Signed   By: Julian Hy M.D.    On: 11/07/2020 03:58   CT RENAL STONE STUDY  Result Date: 11/02/2020 CLINICAL DATA:  80 year old male with acute abdominal and flank pain. EXAM: CT ABDOMEN AND PELVIS WITHOUT CONTRAST TECHNIQUE: Multidetector CT imaging of the abdomen and pelvis was performed following the standard protocol without IV contrast. COMPARISON:  08/08/2014 CT and prior studies FINDINGS: Please note that parenchymal/vascular abnormalities may be missed is intravenous contrast was not administered. Lower chest: A trace RIGHT pleural effusion is noted. Bibasilar scarring again noted. Hepatobiliary: The liver and gallbladder are unremarkable. No biliary dilatation. 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 There is no evidence of hydronephrosis or urinary calculi. Stomach/Bowel: Stomach is within normal limits. No evidence of bowel wall thickening, distention, or inflammatory changes. Vascular/Lymphatic: Aortic atherosclerosis. No enlarged abdominal or pelvic lymph nodes. Reproductive: Prostate is unremarkable. Other: A trace amount of free fluid within the pelvis is identified. No focal collection or pneumoperitoneum. Musculoskeletal: 35% SUPERIOR endplate compression fracture of L2 is age indeterminate but new since 2016. Grade 1 anterolisthesis of L4 on 5 is again noted. IMPRESSION: 1. 35% SUPERIOR endplate compression fracture of L2, age indeterminate but new since 2016. 2. Trace amount of nonspecific free  pelvic fluid and trace RIGHT pleural effusion. 3. Aortic Atherosclerosis (ICD10-I70.0). Electronically Signed   By: Margarette Canada M.D.   On: 11/02/2020 17:12   DG Hip Unilat W or Wo Pelvis 2-3 Views Left  Result Date: 11/11/2020 CLINICAL DATA:  Status post fall. EXAM: DG HIP (WITH OR WITHOUT PELVIS) 2-3V LEFT COMPARISON:  None. FINDINGS: There is no evidence of hip  fracture or dislocation. 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 IMPRESSION: 1. No acute findings. 2. Mild bilateral and symmetric osteoarthritis. Electronically Signed   By: Kerby Moors M.D.   On: 11/11/2020 12:53    Labs:  CBC: Recent Labs    10/23/20 1128 11/07/20 0346 11/07/20 0355 11/11/20 1414 11/12/20 0309  WBC 7.3 13.6*  --  16.2* 10.9*  HGB 12.8* 13.3 13.9 13.3 12.5*  HCT 39.6 41.1 41.0 41.2 39.6  PLT 185 162  --  187 175    COAGS: Recent Labs    10/23/20 1128 11/07/20 0346 11/11/20 1414 11/12/20 0309  INR 3.4* 2.4* 3.4* 3.4*    BMP: Recent Labs    04/30/20 1558 06/06/20 1536 06/09/20 1601 06/18/20 1035 07/01/20 1605 08/06/20 1553 10/02/20 1005 10/23/20 1128 11/07/20 0346 11/07/20 0355 11/11/20 1414 11/12/20 0309  NA 141 142   < > 140 140   < > 143 144 138 140 137 136  K 4.2 3.9   < > 4.3 4.8   < > 4.2 4.7 3.5 3.5 4.8 4.6  CL 102 105   < > 97* 104   < > 106 108 101 99 107 108  CO2 31 26   < > 30 28   < > _0 --  23 17*  GLUCOSE 182* 99   < > 361* 108*   < > 131* 140* 60* 66* 71 111*  BUN 33* 50*   < > 63* 30*   < > 31* 28* 33* 37* 41* 38*  CALCIUM 9.0 8.6   < > 8.8 8.6   < > 9.1 8.9  8.9 8.5*  --  8.7* 8.3*  CREATININE 1.61* 1.89*   < > 2.24* 1.96*   < > 1.63* 1.64* 1.76* 1.90* 1.52* 1.34*  GFRNONAA 40* 33*   < > 27* 31*   < > 42*  --  38*  --  46* 53*  GFRAA 46* 38*  --  31* 36*  --   --   --   --   --   --   --    < > = values in this interval not displayed.    LIVER FUNCTION TESTS: Recent Labs    06/09/20 1601 07/01/20 1605 10/02/20 1005 10/23/20 1128 11/07/20 0346 11/12/20 0309  BILITOT 0.4   < > 0.6 0.9 1.1 1.3*  AST 16   < > _1 ALT 15   < > 32 _2 ALKPHOS 111  --  186*  --  159* 195*  PROT 6.3*   < > 7.0 6.8 6.2* 6.2*  ALBUMIN 3.9  --  3.9  --  3.4* 3.4*   < > = values in this interval not displayed.    TUMOR MARKERS: No results for input(s): AFPTM, CEA, CA199, CHROMGRNA in the  last 8760 hours.  Assessment and Plan: 80 y.o. male with acute, worsening, and symptomatic L2 compression fx who is currently hospitalized for pain management.   IR was requested for image guided kyphoplasty for the L 2 compression  fx.  Case was reviewed and approved by Dr. Dwaine Gale. Labs revealed leukocytosis 10.9, currently patient does not appear to have acute infection. TRH checking UA.  Chronic anemia, hgb 12.5 plt normal, 175.  INR also supratherapeutic, 3.4 today. TRH managing, pt will receive Vit K. - Will repeat INR on Friday morning  Risks and benefits of L2 kyphoplasty were discussed with the patient including, but not limited to education regarding the natural healing process of compression fractures without intervention, bleeding, infection, and cement migration.  All of the patient's questions were answered, after thorough discussion and shared decision making, patient is agreeable to proceed.  Consent signed and in chart.  The L2 kyophplasty is tentatively scheduled for this Friday pending IR schedule and   - insurance authorization   - INR less than 1.5   Made NPO Friday MN Ancef 2g ordered to be given in IR   Thank you for this interesting consult.  I greatly enjoyed meeting [name]. and look forward to participating in their care.  A copy of this report was sent to the requesting provider on this date.  Electronically Signed: Tera Mater, PA-C 11/12/2020, 10:14 AM   I spent a total of 40 Minutes    in face to face in clinical consultation, greater than 50% of which was counseling/coordinating care for L2 kyphoplasty.   This chart was dictated using voice recognition software.  Despite best efforts to proofread,  errors can occur which can change the documentation meaning.

## 2020-11-12 NOTE — Plan of Care (Signed)
?  Problem: Activity: ?Goal: Risk for activity intolerance will decrease ?Outcome: Progressing ?  ?Problem: Safety: ?Goal: Ability to remain free from injury will improve ?Outcome: Progressing ?  ?Problem: Pain Managment: ?Goal: General experience of comfort will improve ?Outcome: Progressing ?  ?

## 2020-11-12 NOTE — TOC Initial Note (Signed)
Transition of Care (TOC) - Initial/Assessment Note    Patient Details  Name: [name]. MRN: [id number] Date of Birth: 1940/09/03  [facility] of [facility]) CM/SW Contact:    Lennart Pall, LCSW Phone Number: 11/12/2020, 2:04 PM  Clinical Narrative:                 Met with pt today to introduce self/ TOC role with dc planning needs.  Pt aware that kyphoplasty procedure tentatively planned for Friday and he is hopeful that this will lead to decreased pain and better mobility.  He confirms that his spouse is in the home, however, no other local family to assist.  Notes that, prior to lumbar fx, he was independent in his home and ADLs.  We discussed possible follow up plans including home with [facility] vs SNF rehab.  TOC will continue to follow and await therapy evaluations/ recommendations following procedure.  Expected Discharge Plan: [place] (vs. SNF) Barriers to Discharge: Continued Medical Work up   Patient Goals and CMS Choice Patient states their goals for this hospitalization and ongoing recovery are:: pt would like to be able to return home but awaits functional progress following planned procedure      Expected Discharge Plan and Services Expected Discharge Plan: [place] (vs. SNF)       Living arrangements for the past 2 months: Single Family Home                                      Prior Living Arrangements/Services Living arrangements for the past 2 months: Single Family Home Lives with:: Spouse Patient language and need for interpreter reviewed:: Yes Do you feel safe going back to the place where you live?: Yes      Need for Family Participation in Patient Care: Yes (Comment)     Criminal Activity/Legal Involvement Pertinent to Current Situation/Hospitalization: No - Comment as needed  Activities of Daily Living Home Assistive Devices/Equipment: Eyeglasses, Environmental consultant (specify type), Other (Comment) (back brace) ADL  Screening (condition at time of admission) Patient's cognitive ability adequate to safely complete daily activities?: Yes Is the patient deaf or have difficulty hearing?: No Does the patient have difficulty seeing, even when wearing glasses/contacts?: Yes (trouble with left eye, fell and glasses knocked implant lose in left eye) Does the patient have difficulty concentrating, remembering, or making decisions?: Yes (some trouble today per pt) Patient able to express need for assistance with ADLs?: Yes Does the patient have difficulty dressing or bathing?: No Independently performs ADLs?: Yes (appropriate for developmental age) Does the patient have difficulty walking or climbing stairs?: Yes (at times) Weakness of Legs: Both Weakness of Arms/Hands: Both  Permission Sought/Granted Permission sought to share information with : Family Supports Permission granted to share information with : Yes, Verbal Permission Granted  Share Information with NAME: Carroll Lingelbach     Permission granted to share info w Relationship: wife  Permission granted to share info w Contact Information: [phone]  Emotional Assessment Appearance:: Appears stated age Attitude/Demeanor/Rapport: Gracious, Engaged Affect (typically observed): Accepting Orientation: : Oriented to Place, Oriented to Self, Oriented to  Time, Oriented to Situation Alcohol / Substance Use: Not Applicable Psych Involvement: No (comment)  Admission diagnosis:  Closed compression fracture of L2 lumbar vertebra, initial encounter ([place]) [S32.020A] Closed fracture of second lumbar vertebra, unspecified fracture morphology, sequela [S32.029S] Patient Active Problem List  Diagnosis Date Noted   Closed compression fracture of L2 lumbar vertebra, initial encounter ([place]) 11/11/2020   Angiodysplasia of gastrointestinal tract    Gastritis    Prolonged QT interval 06/10/2020   Blood loss anemia    GI bleeding 06/09/2020   Atrial fibrillation,  chronic ([place]) 04/29/2020   Secondary hyperparathyroidism, renal ([place]) 03/22/2018   Iron deficiency anemia    Senile purpura ([place]) 11/11/2016   CKD stage 3 due to type 2 diabetes mellitus ([place]) 01/11/2015   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   Chronic restrictive lung disease 09/03/2014   Pulmonary hypertension ([place]) 08/23/2014   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[place]) 01/30/2014   OSA on CPAP 01/30/2014   PFO (patent foramen ovale) 10/30/2013   CAD (coronary artery disease) 10/29/2013   Essential hypertension 10/25/2013   Chronic anticoagulation (INR goal 2.0-2.5) 08/22/2013   Atrial flutter ([place]) 08/21/2013   Vitamin D deficiency 05/19/2013   Type 2 diabetes mellitus with stage 4 chronic kidney disease, with long-term current use of insulin (HCC)    GERD    DJD (degenerative joint disease)    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 [place] [address] Phone: [phone] Fax: [phone]  [id number] [address] [address]: [phone]: [phone]    Social Determinants of Health (SDOH) Interventions    Readmission Risk Interventions No flowsheet data found.

## 2020-11-12 NOTE — Progress Notes (Signed)
Consult received for Pharmacy to dose Lovenox when INR < 2.0.  INR 2.3 this evening s/p Vitamin K 5mg  this morning, therefore, will not begin Lovenox yet.  F/u INR in AM  Peggyann Juba, PharmD, BCPS Pharmacy: [phone] 11/12/2020 6:18 PM

## 2020-11-12 NOTE — Progress Notes (Signed)
PROGRESS NOTE    [name].  VXB:[id number] DOB: 05-Dec-1940 DOA: 11/11/2020 PCP: [name], MD    Chief Complaint  Patient presents with   Back Pain    Brief Narrative: [name]. is a 80 y.o. male with medical history significant of   atrial flutter s/p DCCV, CAD, Adrenal adenoma,  stage 3a ckd, chronic diastolic heart failure, type 2 DM, depression, GERD, hypertension, multiple falls, obstructive sleep apnea had a mechanical fall recently on 11/02/20, was found to have a L2 compression fracture, treated with TLSO brace, and pain meds comes to ED today after a mechanical fall 2 days ago. CT lumbar spine showed Interval progression of compression deformity of L2 vertebral body now approximately 60% vertebral body height loss and with approximately 2 mm retropulsion into the spinal canal of the superior endplate. IR consulted, and he is tentatively scheduled for kyphoplasty on Friday pending improvement in INR and insurance authorization.    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 No bowel and bladder incontinence.  On TLSO brace and on IV morphine and 10 mg of oxycodone every 4 hours.  IR consulted by EDP.       Hypertension:  BP parameters are borderline , WILL decrease the dose of coreg to 1.56 to BID.      Atrial fibrillation s/p MAZE. Followed by Atrial flutter s/p DCCV Currently rate controlled and on anti coagulation/Coumadin, which is held for possibly kyphoplasty.  INR is supra therapeutic. Will order 1 dose of IV vitamin K, recheck INR this evening, if less than 2, start pt on IV heparin.      Hyperlipidemia: Resume statin.      Stage 3 CKD:  Creatinine improving and better than baseline.      Chronic diastolic hart failure:/ Pulmonary artery hypertension  He appears to be compensated.  Last echocardiogram reviewed.      Leukocytosis:  Unclear etiology. Reactive. ? Get UA, CXR is negative.        Type 2 DM, insulin dependent.  Uncontrolled with hypoglycemia.  CBG (last 3)  Recent Labs    11/12/20 1217 11/12/20 1248 11/12/20 1319  GLUCAP 33* 40* 54*    Stop the Novolin 70/30 , start SSI.  Hemoglobin A1c is around 6.3%     GERD: Stable.   H/o CAD s/p CABG s/p MAZE procedure   DVT prophylaxis: none./ SCD's Code Status: full code.  Family Communication: none at bedside.  Disposition:   Status is: Observation  The patient will require care spanning > 2 midnights and should be moved to inpatient because: IV PAIN control.       Consultants:  IR  Procedures: KYPHOPLASTY ON  Friday.   Antimicrobials: none.   Subjective: Pain well controlled. No nausea, vomiting.   Objective: Vitals:   11/12/20 0324 11/12/20 0547 11/12/20 1019 11/12/20 1319  BP: 108/62 111/71 104/63 93/66  Pulse: 74 79 70 73  Resp: 16 18 18 18   Temp: 98.2 F (36.8 C)  98.1 F (36.7 C) 97.6 F (36.4 C)  TempSrc: Oral  Oral Oral  SpO2: 96% 90% 95% 93%  Weight:      Height:        Intake/Output Summary (Last 24 hours) at 11/12/2020 1321 Last data filed at 11/12/2020 0325 Gross per 24 hour  Intake 480 ml  Output 350 ml  Net 130 ml   Filed Weights   11/11/20 1157   Weight: 78 kg    Examination:  General exam: Appears calm and comfortable  Respiratory system: Clear to auscultation. Respiratory effort normal. Cardiovascular system: S1 & S2 heard, RRR. No JVD,  No pedal edema. Gastrointestinal system: Abdomen is nondistended, soft and nontender.  Normal bowel sounds heard. Central nervous system: Alert and oriented. No focal neurological deficits. Extremities: Symmetric 5 x 5 power. Skin: No rashes, lesions or ulcers Psychiatry: Mood & affect appropriate.     Data Reviewed: I have personally reviewed following labs and imaging studies  CBC: Recent Labs  Lab 11/07/20 0346 11/07/20 0355 11/11/20 1414 11/12/20 0309  WBC 13.6*  --  16.2* 10.9*  NEUTROABS 12.0*  --  14.2*  --   HGB 13.3 13.9 13.3 12.5*  HCT 41.1 41.0 41.2 39.6  MCV 95.1  --  96.5 97.1  PLT 162  --  187 096    Basic Metabolic Panel: Recent Labs  Lab 11/07/20 0346 11/07/20 0355 11/11/20 1414 11/12/20 0309  NA 138 140 137 136  K 3.5 3.5 4.8 4.6  CL 101 99 107 108  CO2 26  --  23 17*  GLUCOSE 60* 66* 71 111*  BUN 33* 37* 41* 38*  CREATININE 1.76* 1.90* 1.52* 1.34*  CALCIUM 8.5*  --  8.7* 8.3*    GFR: Estimated Creatinine Clearance: 46 mL/min (A) (by C-G formula based on SCr of 1.34 mg/dL (H)).  Liver Function Tests: Recent Labs  Lab 11/07/20 0346 11/12/20 0309  AST 19 19  ALT 26 27  ALKPHOS 159* 195*  BILITOT 1.1 1.3*  PROT 6.2* 6.2*  ALBUMIN 3.4* 3.4*    CBG: Recent Labs  Lab 11/11/20 2234 11/12/20 0726 11/12/20 1217 11/12/20 1248 11/12/20 1319  GLUCAP 85 102* 33* 40* 54*     Recent Results (from the past 240 hour(s))  Resp Panel by RT-PCR (Flu A&B, Covid) Nasopharyngeal Swab     Status: None   Collection Time: 11/11/20  5:50 PM   Specimen: Nasopharyngeal Swab; Nasopharyngeal(NP) swabs in vial transport medium  Result Value Ref Range Status   SARS Coronavirus 2 by RT PCR NEGATIVE NEGATIVE Final    Comment: (NOTE) SARS-CoV-2 target  nucleic acids are NOT DETECTED.  The SARS-CoV-2 RNA is generally detectable in upper respiratory specimens during the acute phase of infection. The lowest concentration of SARS-CoV-2 viral copies this assay can detect is 138 copies/mL. A negative result does not preclude SARS-Cov-2 infection and should not be used as the sole basis for treatment or other patient management decisions. A negative result may occur with  improper specimen collection/handling, submission of specimen other than nasopharyngeal swab, presence of viral mutation(s) within the areas targeted by this assay, and inadequate number of viral copies(<138 copies/mL). A negative result must  be combined with clinical observations, patient history, and epidemiological information. The expected result is Negative.  Fact Sheet for Patients:  EntrepreneurPulse.com.au  Fact Sheet for Healthcare Providers:  IncredibleEmployment.be  This test is no t yet approved or cleared by the [place] FDA and  has been authorized for detection and/or diagnosis of SARS-CoV-2 by FDA under an Emergency Use Authorization (EUA). This EUA will remain  in effect (meaning this test can be used) for the duration of the COVID-19 declaration under Section 564(b)(1) of the Act, 21 U.S.C.section 360bbb-3(b)(1), unless the authorization is terminated  or revoked sooner.       Influenza A by PCR NEGATIVE NEGATIVE Final   Influenza B by PCR NEGATIVE NEGATIVE Final    Comment: (NOTE) The Xpert Xpress SARS-CoV-2/FLU/RSV plus assay is intended as an aid in the diagnosis of influenza from Nasopharyngeal swab specimens and should not be used as a sole basis for treatment. Nasal washings and aspirates are unacceptable for Xpert Xpress SARS-CoV-2/FLU/RSV testing.  Fact Sheet for Patients: EntrepreneurPulse.com.au  Fact Sheet for Healthcare  Providers: IncredibleEmployment.be  This test is not yet approved or cleared by the [place] FDA and has been authorized for detection and/or diagnosis of SARS-CoV-2 by FDA under an Emergency Use Authorization (EUA). This EUA will remain in effect (meaning this test can be used) for the duration of the COVID-19 declaration under Section 564(b)(1) of the Act, 21 U.S.C. section 360bbb-3(b)(1), unless the authorization is terminated or revoked.  Performed at [facility], [place] [address] [place], [place] [address]          Radiology Studies: CT HEAD WO CONTRAST (5MM)  Result Date: 11/11/2020 CLINICAL DATA:  Status post fall from bed last night. Posterior neck pain. Generalized weakness. EXAM: CT HEAD WITHOUT CONTRAST CT CERVICAL SPINE WITHOUT CONTRAST TECHNIQUE: Multidetector CT imaging of the head and cervical spine was performed following the standard protocol without intravenous contrast. Multiplanar CT image reconstructions of the cervical spine were also generated. COMPARISON:  None. FINDINGS: CT HEAD FINDINGS Brain: There is no evidence for acute hemorrhage, hydrocephalus, mass lesion, or abnormal extra-axial fluid collection. No definite CT evidence for acute infarction. Diffuse loss of parenchymal volume is consistent with atrophy. Patchy low attenuation in the deep hemispheric and periventricular white matter is nonspecific, but likely reflects chronic microvascular ischemic demyelination. Vascular: No hyperdense vessel or unexpected calcification. Skull: No evidence for fracture. No worrisome lytic or sclerotic lesion. Sinuses/Orbits: Chronic mucosal disease noted in the left frontal, left ethmoid, and left maxillary sinuses, potentially related to a ostiomeatal complex disease. Mastoid air cells are clear bilaterally. Visualized portions of the globes and intraorbital fat are unremarkable. Other: None. CT CERVICAL SPINE FINDINGS Alignment:  Straightening of normal cervical lordosis evident. Trace anterolisthesis of C4 on 5 likely related to the loss of facet space at the same level. Skull base and vertebrae: No acute fracture. No primary bone lesion or focal pathologic process. Soft tissues and spinal canal: No prevertebral fluid or swelling. No visible canal hematoma. Disc levels: Loss of disc height noted C5-6 and C6-7 as well as C7-T1. Upper chest: Pleural thickening noted medial left lung apex (84/4). Other: None. IMPRESSION: 1. No acute intracranial abnormality. Atrophy with chronic small vessel white matter ischemic disease. 2. 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 No evidence for cervical spine fracture. Loss of normal cervical lordosis. This can be related to patient positioning, muscle spasm or soft tissue injury. 4. Degenerative disc changes  in the cervical spine. 5. Pleural thickening medial left lung apex. This should be further assessed with dedicated CT chest without contrast. Electronically Signed   By: Misty Stanley M.D.   On: 11/11/2020 13:02   CT Cervical Spine Wo Contrast  Result Date: 11/11/2020 CLINICAL DATA:  Status post fall from bed last night. Posterior neck pain. Generalized weakness. EXAM: CT HEAD WITHOUT CONTRAST CT CERVICAL SPINE WITHOUT CONTRAST TECHNIQUE: Multidetector CT imaging of the head and cervical spine was performed following the standard protocol without intravenous contrast. Multiplanar CT image reconstructions of the cervical spine were also generated. COMPARISON:  None. FINDINGS: CT HEAD FINDINGS Brain: There is no evidence for acute hemorrhage, hydrocephalus, mass lesion, or abnormal extra-axial fluid collection. No definite CT evidence for acute infarction. Diffuse loss of parenchymal volume is consistent with atrophy. Patchy low attenuation in the deep hemispheric and periventricular white matter is nonspecific, but likely reflects chronic  microvascular ischemic demyelination. Vascular: No hyperdense vessel or unexpected calcification. Skull: No evidence for fracture. No worrisome lytic or sclerotic lesion. Sinuses/Orbits: Chronic mucosal disease noted in the left frontal, left ethmoid, and left maxillary sinuses, potentially related to a ostiomeatal complex disease. Mastoid air cells are clear bilaterally. Visualized portions of the globes and intraorbital fat are unremarkable. Other: None. CT CERVICAL SPINE FINDINGS Alignment: Straightening of normal cervical lordosis evident. Trace anterolisthesis of C4 on 5 likely related to the loss of facet space at the same level. Skull base and vertebrae: No acute fracture. No primary bone lesion or focal pathologic process. Soft tissues and spinal canal: No prevertebral fluid or swelling. No visible canal hematoma. Disc levels: Loss of disc height noted C5-6 and C6-7 as well as C7-T1. Upper chest: Pleural thickening noted medial left lung apex (84/4). Other: None. IMPRESSION: 1. No acute intracranial abnormality. Atrophy with chronic small vessel white matter ischemic disease. 2. 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 No evidence for cervical spine fracture. Loss of normal cervical lordosis. This can be related to patient positioning, muscle spasm or soft tissue injury. 4. Degenerative disc changes in the cervical spine. 5. Pleural thickening medial left lung apex. This should be further assessed with dedicated CT chest without contrast. Electronically Signed   By: Misty Stanley M.D.   On: 11/11/2020 13:02   CT Lumbar Spine Wo Contrast  Result Date: 11/11/2020 CLINICAL DATA:  Recent L2 vertebral body fracture, fall last night EXAM: CT LUMBAR SPINE WITHOUT CONTRAST TECHNIQUE: Multidetector CT imaging of the lumbar spine was performed without intravenous contrast administration. Multiplanar CT image reconstructions were also generated. COMPARISON:  CT  examination dated November 02, 2020 FINDINGS: Segmentation: 5 lumbar type vertebrae. Alignment: Grade 1 anterolisthesis of L4 Vertebrae: Interval progression of the compression deformity of L 2 vertebral body now approximately 60% vertebral body height loss. There is approximately 2 mm retropulsion into the spinal canal of the superior endplate. Diffuse osteopenia. No aggressive osseous. Paraspinal and other soft tissues: Mild soft tissue swelling tissue edema on the anterior aspect of the L2 vertebral body Disc levels: Mild multilevel degenerate disc disease with disc protrusion prominent at L4 and L5 with associated facet joint arthropathy. IMPRESSION: 1. Interval progression of compression deformity of L2 vertebral body now approximately 60% vertebral body height loss and with approximately 2 mm retropulsion into the spinal canal of the superior endplate. 2.  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 Electronically Signed   By: Keane Police D.O.   On: 11/11/2020 13:36   DG  Hip Unilat W or Wo Pelvis 2-3 Views Left  Result Date: 11/11/2020 CLINICAL DATA:  Status post fall. EXAM: DG HIP (WITH OR WITHOUT PELVIS) 2-3V LEFT COMPARISON:  None. FINDINGS: There is no evidence of hip fracture or dislocation. 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 IMPRESSION: 1. No acute findings. 2. Mild bilateral and symmetric osteoarthritis. Electronically Signed   By: Kerby Moors M.D.   On: 11/11/2020 12:53        Scheduled Meds:  allopurinol  300 mg Oral Daily   carvedilol  3.125 mg Oral BID WC   colchicine  0.6 mg Oral QODAY   dapagliflozin propanediol  5 mg Oral QAC breakfast   gabapentin  300 mg Oral BID   insulin aspart  0-9 Units Subcutaneous TID WC   sertraline  100 mg Oral QHS   Continuous Infusions:  [START ON 11/15/2020]  ceFAZolin (ANCEF) IV       LOS: 0 days        Hosie Poisson, MD [facility]   To contact the attending provider between 7A-7P or  the covering provider during after hours 7P-7A, please log into the web site [email or web address] and access using universal [place] password for that web site. If you do not have the password, please call the hospital operator.  11/12/2020, 1:21 PM

## 2020-11-13 DIAGNOSIS — I5032 Chronic diastolic (congestive) heart failure: Secondary | ICD-10-CM

## 2020-11-13 DIAGNOSIS — E1169 Type 2 diabetes mellitus with other specified complication: Secondary | ICD-10-CM

## 2020-11-13 DIAGNOSIS — Z951 Presence of aortocoronary bypass graft: Secondary | ICD-10-CM

## 2020-11-13 DIAGNOSIS — N184 Chronic kidney disease, stage 4 (severe): Secondary | ICD-10-CM

## 2020-11-13 DIAGNOSIS — K2100 Gastro-esophageal reflux disease with esophagitis, without bleeding: Secondary | ICD-10-CM

## 2020-11-13 DIAGNOSIS — E785 Hyperlipidemia, unspecified: Secondary | ICD-10-CM

## 2020-11-13 DIAGNOSIS — Z9889 Other specified postprocedural states: Secondary | ICD-10-CM

## 2020-11-13 DIAGNOSIS — Z8679 Personal history of other diseases of the circulatory system: Secondary | ICD-10-CM

## 2020-11-13 DIAGNOSIS — I43 Cardiomyopathy in diseases classified elsewhere: Secondary | ICD-10-CM

## 2020-11-13 DIAGNOSIS — I10 Essential (primary) hypertension: Secondary | ICD-10-CM

## 2020-11-13 DIAGNOSIS — Z794 Long term (current) use of insulin: Secondary | ICD-10-CM

## 2020-11-13 DIAGNOSIS — I110 Hypertensive heart disease with heart failure: Secondary | ICD-10-CM

## 2020-11-13 LAB — CBC
HCT: 39.6 % (ref 39.0–52.0)
Hemoglobin: 12.3 g/dL — ABNORMAL LOW (ref 13.0–17.0)
MCH: 30.4 pg (ref 26.0–34.0)
MCHC: 31.1 g/dL (ref 30.0–36.0)
MCV: 97.8 fL (ref 80.0–100.0)
Platelets: 198 10*3/uL (ref 150–400)
RBC: 4.05 MIL/uL — ABNORMAL LOW (ref 4.22–5.81)
RDW: 16.9 % — ABNORMAL HIGH (ref 11.5–15.5)
WBC: 12.2 10*3/uL — ABNORMAL HIGH (ref 4.0–10.5)
nRBC: 0 % (ref 0.0–0.2)

## 2020-11-13 LAB — BASIC METABOLIC PANEL
Anion gap: 7 (ref 5–15)
BUN: 35 mg/dL — ABNORMAL HIGH (ref 8–23)
CO2: 22 mmol/L (ref 22–32)
Calcium: 8.5 mg/dL — ABNORMAL LOW (ref 8.9–10.3)
Chloride: 105 mmol/L (ref 98–111)
Creatinine, Ser: 1.51 mg/dL — ABNORMAL HIGH (ref 0.61–1.24)
GFR, Estimated: 46 mL/min — ABNORMAL LOW (ref >60)
Glucose, Bld: 99 mg/dL (ref 70–99)
Potassium: 4.7 mmol/L (ref 3.5–5.1)
Sodium: 134 mmol/L — ABNORMAL LOW (ref 135–145)

## 2020-11-13 LAB — PROTIME-INR
INR: 1.7 — ABNORMAL HIGH (ref 0.8–1.2)
Prothrombin Time: 20.3 seconds — ABNORMAL HIGH (ref 11.4–15.2)

## 2020-11-13 LAB — GLUCOSE, CAPILLARY
Glucose-Capillary: 83 mg/dL (ref 70–99)
Glucose-Capillary: 147 mg/dL — ABNORMAL HIGH (ref 70–99)
Glucose-Capillary: 208 mg/dL — ABNORMAL HIGH (ref 70–99)
Glucose-Capillary: 173 mg/dL — ABNORMAL HIGH (ref 70–99)

## 2020-11-13 MED ORDER — ENOXAPARIN SODIUM 80 MG/0.8ML IJ SOSY
80.0000 mg | PREFILLED_SYRINGE | Freq: Two times a day (BID) | INTRAMUSCULAR | Status: DC
  Administered 2020-11-13: 80 mg via SUBCUTANEOUS
  Filled 2020-11-13: qty 0.8

## 2020-11-13 MED ORDER — CEFAZOLIN SODIUM-DEXTROSE 2-4 GM/100ML-% IV SOLN
2.0000 g | INTRAVENOUS | Status: AC
  Administered 2020-11-14: 2 g via INTRAVENOUS
  Filled 2020-11-13: qty 100

## 2020-11-13 MED ORDER — SODIUM CHLORIDE 0.9 % IV SOLN
INTRAVENOUS | Status: DC
Start: 2020-11-14 — End: 2020-11-14

## 2020-11-13 NOTE — Evaluation (Signed)
Occupational Therapy Evaluation Patient Details Name: [name]. MRN: [id number] DOB: Nov 20, 1940 Today's Date: 11/13/2020   History of Present Illness 80 y.o. male presenting to ED 11/7 after a fall out of bed. Patient declines LOC or hitting head. CT head/neck (-) acute intracranial abnormality or cervical fx. CT lumbar spine (+) progression of compression deformity of L2 vertebral body  and ~2 mm retropulsion into the spinal canal of the superior endplate. Cleared by MD to mobilize with therapy 11/9. Planned kyphoplasty 11/11. PMHx significant for Aflutter s/p DCCV, CAD s/p CAGB, adrenal adenoma, CKD IIIa, CHF, DMII, depression, GERD, HTN, OSA and recurrent falls with most recent 10/29 resulting in L2 compression fx treated conservatively with TLSO.   Clinical Impression   Patient with decreased cognition at time of evaluation. No family present at bedside to confirm PLOF or home set-up but some information present in med chart from previous admission several months ago. PTA patient was living with his spouse in a private residence with several STE. Patient reports wife assists with tub/shower transfers and IADLs including meal prep. Patient reports driving prior to fall 10/29. No family present to confirm PLOF. Patient currently functioning below baseline demonstrating observed ADLs with Min to Max A and use of RW. Patient also limited by deficits listed below including generalized weakness/debility, decreased standing balance and decreased cognition and would benefit from continued acute OT services in prep for safe d/c to next level of care. Will reassess after scheduled kyphoplasty 11/11.       Recommendations for follow up therapy are one component of a multi-disciplinary discharge planning process, led by the attending physician.  Recommendations may be updated based on patient status, additional functional criteria and insurance authorization.   Follow Up Recommendations  Long-term  institutional care without follow-up therapy    Assistance Recommended at Discharge Frequent or constant Supervision/Assistance  Functional Status Assessment  Patient has had a recent decline in their functional status and demonstrates the ability to make significant improvements in function in a reasonable and predictable amount of time.  Equipment Recommendations  BSC/3in1;Tub/shower bench;Other (comment) (Rolling walker)    Recommendations for Other Services       Precautions / Restrictions Precautions Precautions: Fall;Back Precaution Booklet Issued: No (Would benefit from written education when family is available given decreased cognition.) Precaution Comments: Verbal education provided on back precautions. Able to immediately recall 2/3 precautions. Cues for adherence during ADLs. Required Braces or Orthoses: Spinal Brace Spinal Brace: Thoracolumbosacral orthotic;Applied in sitting position Restrictions Weight Bearing Restrictions: No      Mobility Bed Mobility               General bed mobility comments: Seated in recliner upon entry.    Transfers Overall transfer level: Needs assistance Equipment used: Rolling walker (2 wheels) Transfers: Sit to/from Stand;Bed to chair/wheelchair/BSC Sit to Stand: Min assist     Step pivot transfers: Mod assist     General transfer comment: Sit to stand from recliner to RW with cues for hand placement and Min A. Min A for stand to sit to recliner for controlled descent with cues for hand placement. Patient nearly missed recliner.      Balance Overall balance assessment: Needs assistance Sitting-balance support: No upper extremity supported Sitting balance-Leahy Scale: Fair     Standing balance support: Bilateral upper extremity supported;Reliant on assistive device for balance Standing balance-Leahy Scale: Poor Standing balance comment: requiring RW and min-mod A  ADL either  performed or assessed with clinical judgement   ADL Overall ADL's : Needs assistance/impaired Eating/Feeding: Set up;Sitting Eating/Feeding Details (indicate cue type and reason): Completed small snack of crackers and apple juice with set-up assist. Noted occasional coughing with swallowing. Grooming: Minimal assistance;Standing;Sitting Grooming Details (indicate cue type and reason): Min A for oral hygiene in standing. Completed face washing seated in recliner 2/2 pain and BLE weakness. Upper Body Bathing: Minimal assistance;Sitting   Lower Body Bathing: Maximal assistance;Sit to/from stand   Upper Body Dressing : Minimal assistance;Sitting   Lower Body Dressing: Maximal assistance;Sit to/from stand   Toilet Transfer: Moderate assistance;Rolling walker (2 wheels)   Toileting- Clothing Manipulation and Hygiene: Maximal assistance;Sit to/from stand               Vision Baseline Vision/History: 1 Wears glasses Ability to See in Adequate Light: 0 Adequate Patient Visual Report: No change from baseline Vision Assessment?: No apparent visual deficits     Perception     Praxis      Pertinent Vitals/Pain Pain Assessment: Faces Pain Score: 8  Faces Pain Scale: Hurts whole lot Pain Location: Back and radiates down L leg at times Pain Descriptors / Indicators: Sharp Pain Intervention(s): Limited activity within patient's tolerance;Monitored during session;Repositioned     Hand Dominance     Extremity/Trunk Assessment Upper Extremity Assessment Upper Extremity Assessment: Overall WFL for tasks assessed (Did not formally assess.)   Lower Extremity Assessment Lower Extremity Assessment:  (ROM WFL; MMT at least 3/5 but not further tested due to compression fx and pain)   Cervical / Trunk Assessment Cervical / Trunk Assessment: Kyphotic;Other exceptions Cervical / Trunk Exceptions: Compression fx - TLSO when OOB   Communication Communication Communication: No difficulties    Cognition Arousal/Alertness: Awake/alert;Lethargic (Asleep upon entry but easily awoken.) Behavior During Therapy: WFL for tasks assessed/performed Overall Cognitive Status: No family/caregiver present to determine baseline cognitive functioning                                 General Comments: A&O to person and place. States that he is not in the hosptial because of a fall. Corrently reports month but states year as "2020". Follows 1-step verbal commands with good accuracy.     General Comments       Exercises     Shoulder Instructions      Home Living Family/patient expects to be discharged to:: Private residence Living Arrangements: Spouse/significant other Available Help at Discharge: Family;Available 24 hours/day Type of Home: House Home Access: Stairs to enter [employer] of Steps: 3 Entrance Stairs-Rails: None Home Layout: One level     Bathroom Shower/Tub: Occupational psychologist: Handicapped height     Home Equipment: Ester - single point   Additional Comments: Information obtained from previous admission. No family present at bedside.      Prior Functioning/Environment Prior Level of Function : Independent/Modified Independent             Mobility Comments: Ambulated without AD (at times uses cane); could ambulate in community. Drives (reports driving several days prior to admission to meet his friend for lunch). ADLs Comments: Reports assist from wife for tub/shower transfers; stands to bathe in shower. I with toileting/dressing. Wife assists with housekeeping and meal prep tasks.        OT Problem List: Decreased strength;Decreased activity tolerance;Impaired balance (sitting and/or standing);Decreased cognition;Decreased safety awareness;Decreased knowledge of use  of DME or AE;Decreased knowledge of precautions;Pain      OT Treatment/Interventions: Self-care/ADL training;Therapeutic exercise;Energy conservation;DME  and/or AE instruction;Therapeutic activities;Cognitive remediation/compensation;Patient/family education;Balance training    OT Goals(Current goals can be found in the care plan section) Acute Rehab OT Goals Patient Stated Goal: To decrease pain. OT Goal Formulation: With patient Time For Goal Achievement: 11/27/20 Potential to Achieve Goals: Good ADL Goals Pt Will Perform Grooming: with supervision;standing Pt Will Perform Upper Body Dressing: with set-up;sitting Pt Will Perform Lower Body Dressing: with supervision;sit to/from stand Pt Will Transfer to Toilet: with supervision;ambulating;bedside commode Pt Will Perform Toileting - Clothing Manipulation and hygiene: with supervision;sit to/from stand Pt Will Perform Tub/Shower Transfer: with min guard assist;ambulating;tub bench;rolling walker;grab bars Additional ADL Goal #1: Patient will recall 3/3 spinal precautions with no more than 1 verbal cue and good return demo during ADLs.  OT Frequency: Min 2X/week   Barriers to D/C: Inaccessible home environment  3 STE home       Co-evaluation              AM-PAC OT "6 Clicks" Daily Activity     Outcome Measure Help from another person eating meals?: A Little Help from another person taking care of personal grooming?: A Little Help from another person toileting, which includes using toliet, bedpan, or urinal?: A Lot Help from another person bathing (including washing, rinsing, drying)?: A Lot Help from another person to put on and taking off regular upper body clothing?: A Little Help from another person to put on and taking off regular lower body clothing?: A Lot 6 Click Score: 15   End of Session Equipment Utilized During Treatment: Rolling walker (2 wheels);Other (comment);Back brace (TLSO) Nurse Communication: Mobility status  Activity Tolerance: Patient tolerated treatment well;Patient limited by pain Patient left: in chair;with call bell/phone within reach;with chair  alarm set  OT Visit Diagnosis: Unsteadiness on feet (R26.81);Repeated falls (R29.6);Muscle weakness (generalized) (M62.81);Pain;Other symptoms and signs involving cognitive function Pain - part of body:  (low back)                Time: 1607-3710 OT Time Calculation (min): 19 min Charges:  OT General Charges $OT Visit: 1 Visit OT Evaluation $OT Eval Moderate Complexity: 1 Mod  Sherrin Stahle H. OTR/L Supplemental OT, Department of rehab services [phone]  Martel Galvan R H. 11/13/2020, 2:07 PM

## 2020-11-13 NOTE — Progress Notes (Signed)
ANTICOAGULATION CONSULT NOTE - Initial Consult  Pharmacy Consult for Lovenox Indication: atrial fibrillation (bridge therapy while warfarin on hold)  Allergies  Allergen Reactions   Sunflower Oil Swelling and Other (See Comments)    Tongue and lip swelling Other reaction(s): Other Tongue and lip swelling   Horse-Derived Products Other (See Comments)    Per allergy skin test UNSPECIFIED REACTION  Other reaction(s): Other Per allergy skin test UNSPECIFIED REACTION    Other Other (See Comments)    Tetanus Shot -- skin test reaction   Tetanus Toxoids Other (See Comments)    Per allergy skin test   Tetanus Toxoid     Other reaction(s): Other (See Comments) Rash(horse serum) Other reaction(s): Other Rash(horse serum)    Patient Measurements: Height: 5\' 11"  (180.3 cm) Weight: 78 kg (172 lb) IBW/kg (Calculated) : 75.3   Vital Signs: Temp: 98.7 F (37.1 C) (11/08 2132) Temp Source: Oral (11/08 2132) BP: 131/90 (11/08 2132) Pulse Rate: 71 (11/08 2132)  Labs: Recent Labs    11/11/20 1414 11/12/20 0309 11/12/20 1736 11/13/20 0321  HGB 13.3 12.5*  --  12.3*  HCT 41.2 39.6  --  39.6  PLT 187 175  --  198  LABPROT 33.9* 34.6* 25.2* 20.3*  INR 3.4* 3.4* 2.3* 1.7*  CREATININE 1.52* 1.34*  --   --     Estimated Creatinine Clearance: 46 mL/min (A) (by C-G formula based on SCr of 1.34 mg/dL (H)).   Medical History: Past Medical History:  Diagnosis Date   Adrenal adenoma    Arthritis    Atypical atrial flutter ([place]) 8/15, 10/15   a. DCCV 08/2013. b. s/p RFA 10/2013.   Basal cell carcinoma    CAD (coronary artery disease)    a. 04/2013 CABG x 2: LIMA to LAD, SVG to RI, EVH via R thigh.   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 s/p MAZE 04/2013 in setting of CABG. b. Amio stopped in 10/2013 after flutter ablation.   PFO (patent foramen ovale)    a. Small PFO by TEE 10/2013.   Pleural effusion, left    a. s/p thoracentesis 05/2013.   Respiratory failure ([place])    a. Hypoxia 10/2013 - required supp O2 as inpatient, did not require it at discharge.   S/P Maze operation for atrial fibrillation    a. 04/2013: Complete bilateral atrial lesion set using cryothermy and bipolar radiofrequency ablation with clipping of LA appendage (@ time of CABG)    Medications:  PTA Warfarin  Assessment: 80 yr male admitted with compression fracture of the L2 lumbar vertebra s/p fall on 11/02/20.  Kyphoplasty tentatively scheduled for Friday 11/11 PTA patient on warfarin 7.5mg  daily except 10mg  on Sundays/Thursdays.  INR on admission = 3.4 (11/7).  Warfarin on hold pending surgery Received Vitamin K 5mg  IV x 1 dose on 11/8 Pharmacy consulted to begin Lovenox when INR < 2.0 INR = 1.7 this AM  Goal of Therapy:  Full anticoagulation Monitor platelets by anticoagulation protocol: Yes   Plan:  Lovenox 80mg  sq q12h as INR now < 2   Iviona Hole, Toribio Harbour, PharmD 11/13/2020,4:07 AM

## 2020-11-13 NOTE — Progress Notes (Addendum)
[name] was tentatively scheduled for L2 kyphoplasty on Friday 11/11 pending insurance authorization and improvement in INR.   Insurance stated that the procedure should be covered under inpatient stay.   The kyphoplasty is now tentatively scheduled for tomorrow, 11/10 12 pm pending IR schedule and repeat INR result.  Informed the patient via telephone.   Informed attending MD and RN vis secure chat, asked to hold 5pm and 5am Lovenox.  Lovenox can be resumed in 6 hours after the procedure is done.  NPO order modified, NPO Thursday MN.   Will check on INR tomorrow morning and proceed if less than 1.5.  Please call IR for questions and concerns.   [name] Roxanna Mcever PA-C 11/13/2020 3:04 PM

## 2020-11-13 NOTE — Progress Notes (Signed)
, PROGRESS NOTE    [name].  JHE:[id number] DOB: 02-03-1940 DOA: 11/11/2020 PCP: [name], MD    Brief Narrative:  [name]. is a 80 year old male with past medical history significant for atrial flutter s/p DCCV, CAD, adrenal adenoma, CKD stage IIIa, chronic diastolic congestive heart failure, type 2 diabetes mellitus, depression, GERD, essential hypertension, OSA, multiple falls with recent L2 compression fracture treated with TLSO brace and pain medication who presents to [facility] ED on 11/7 with continued low back pain following recurrent fall.  In the ED, afebrile, BP 160/107, creatinine 1.52, WBC 16.2, INR 3.4, COVID-19 PCR/influenza A/B PCR negative.  CT head/C-spine with no acute intracranial abnormality and no cervical fracture/listhesis.  CT L-spine showed interval progression of compression deformity L2 with vertebral body now approximately 6% height loss with approximately 2 mm retropulsion into spinal canal of the superior endplate.  Hip x-rays with no acute findings.  IR was consulted for kyphoplasty.  Duration consulted for further evaluation and management of acute pain secondary to recurrent falls with lumbar compression fracture.   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  CT L-spine notable for interval progression of L2 compression fracture now with 50% height loss with 2 mm retropulsion into the spinal canal of the superior endplate.  Patient without bowel or bladder incontinence and no weakness. --IR consulted for kyphoplasty, plan 11/10, n.p.o. after midnight --Continue TLSO brace --Oxycodone-acetaminophen 5-325 mg, 1-2 tablets every 4 hours as needed moderate pain --Morphine 1-2 mg IV every 4 hours severe pain --PT/OT currently recommending SNF placement, TOC for evaluation  Essential hypertension BP 125/90 this morning, well controlled. --Carvedilol reduced to 1.5625 mg twice daily --Continue to monitor BP closely  Paroxysmal atrial fibrillation s/P MAZE procedure Hx atrial flutter s/p DCCV On anticoagulation with Coumadin outpatient. --Holding Coumadin for planned kyphoplasty as above --Carvedilol reduced to 1.5625 mg twice daily --Continue monitor on telemetry  Chronic diastolic congestive heart failure, compensated Home medications include metolazone 5 mg p.o. daily as needed, carvedilol 3.125 mg p.o. twice daily, torsemide 100 mg p.o. daily. -- Continue carvedilol at reduced dose 1.5625gm BID --Holding home metolazone/torsemide --Strict I's and O's and daily weights  Supratherapeutic INR On Coumadin outpatient for A. fib/flutter.  INR 3.4 on admission, s/p 1 dose of vitamin K IV. --INR 3.4>1.7 --Repeat INR in a.m.  CKD stage IIIa --Cr 1.52>1.34>1.51, stable --Avoid nephrotoxins, renal dose all medications  Hyperlipidemia On atorvastatin 20 mg 3 times weekly on Monday/Wednesday/Friday. --Hold statin for now  Type 2 diabetes mellitus Home regimen includes NPH 70/30 45 units subcutaneously daily with breakfast and Farxiga 5 mg p.o. daily.  Hemoglobin A1c 6.3, well controlled. --Farxiga 5 mg p.o. daily --Holding home NPH --Sensitive SSI for coverage --CBGs  qAC/HS  Diabetic neuropathy: Gabapentin 3 mg p.o.  twice daily  Hx gout: Allopurinol 3 mg p.o. daily  Anxiety/depression: --Zoloft 1 mg p.o. daily --Xanax 0.5 mg p.o. nightly as needed  DVT prophylaxis:   SCDs, holding chemical DVT prophylaxis for planned kyphoplasty   Code Status: Full Code Family Communication: No family present at bedside this morning  Disposition Plan:  Level of care: Med-Surg Status is: Inpatient  Remains inpatient appropriate because: Pending kyphoplasty, likely needs SNF placement   Consultants:  Interventional radiology  Procedures:  None  Antimicrobials:  None   Subjective: Patient seen examined bedside, resting comfortably.  Continues with low back pain.  IR plans kyphoplasty tomorrow.  Seen by PT/OT with recommendations of SNF placement today.  No other questions or concerns at this time.  Denies headache, no fever/chills/night sweats, no nausea/vomiting/diarrhea, no chest pain, no palpitations, no abdominal pain, no cough/congestion.  No acute events overnight per nurse staff.  Objective: Vitals:   11/12/20 1319 11/12/20 1732 11/12/20 2132 11/13/20 0449  BP: 93/66 128/71 131/90 125/90  Pulse: 73 72 71 71  Resp: 18 18 16 16   Temp: 97.6 F (36.4 C) 98.4 F (36.9 C) 98.7 F (37.1 C) 98.7 F (37.1 C)  TempSrc: Oral Oral Oral Oral  SpO2: 93% 94% 90% 94%  Weight:      Height:        Intake/Output Summary (Last 24 hours) at 11/13/2020 1407 Last data filed at 11/12/2020 2230 Gross per 24 hour  Intake 120 ml  Output 600 ml  Net -480 ml   Filed Weights   11/11/20 1157  Weight: 78 kg    Examination:  General exam: Appears calm and comfortable  Respiratory system: Clear to auscultation. Respiratory effort normal.  On room air Cardiovascular system: S1 & S2 heard, RRR. No JVD, murmurs, rubs, gallops or clicks. No pedal edema. Gastrointestinal system: Abdomen is nondistended, soft and nontender. No organomegaly or masses felt. Normal bowel sounds heard. Central nervous system: Alert and  oriented. No focal neurological deficits. Extremities: Symmetric 5 x 5 power. Skin: No rashes, lesions or ulcers Psychiatry: Judgement and insight appear normal. Mood & affect appropriate.     Data Reviewed: I have personally reviewed following labs and imaging studies  CBC: Recent Labs  Lab 11/07/20 0346 11/07/20 0355 11/11/20 1414 11/12/20 0309 11/13/20 0321  WBC 13.6*  --  16.2* 10.9* 12.2*  NEUTROABS 12.0*  --  14.2*  --   --   HGB 13.3 13.9 13.3 12.5* 12.3*  HCT 41.1 41.0 41.2 39.6 39.6  MCV 95.1  --  96.5 97.1 97.8  PLT 162  --  187 175 016   Basic Metabolic Panel: Recent Labs  Lab 11/07/20 0346 11/07/20 0355 11/11/20 1414 11/12/20 0309 11/13/20 0321  NA 138 140 137 136 134*  K 3.5 3.5 4.8 4.6 4.7  CL 101 99 107 108 105  CO2 26  --  23 17* 22  GLUCOSE 60* 66* 71 111* 99  BUN 33* 37* 41* 38* 35*  CREATININE 1.76* 1.90* 1.52* 1.34* 1.51*  CALCIUM 8.5*  --  8.7* 8.3* 8.5*   GFR: Estimated Creatinine Clearance: 40.9 mL/min (A) (by C-G formula based on SCr of 1.51 mg/dL (H)). Liver Function Tests: Recent Labs  Lab 11/07/20 0346 11/12/20 0309  AST 19 19  ALT 26 27  ALKPHOS 159* 195*  BILITOT 1.1 1.3*  PROT 6.2* 6.2*  ALBUMIN 3.4* 3.4*   No results for input(s): LIPASE, AMYLASE in the last  168 hours. No results for input(s): AMMONIA in the last 168 hours. Coagulation Profile: Recent Labs  Lab 11/07/20 0346 11/11/20 1414 11/12/20 0309 11/12/20 1736 11/13/20 0321  INR 2.4* 3.4* 3.4* 2.3* 1.7*   Cardiac Enzymes: No results for input(s): CKTOTAL, CKMB, CKMBINDEX, TROPONINI in the last 168 hours. BNP (last 3 results) No results for input(s): PROBNP in the last 8760 hours. HbA1C: No results for input(s): HGBA1C in the last 72 hours. CBG: Recent Labs  Lab 11/12/20 1424 11/12/20 1601 11/12/20 2131 11/13/20 0736 11/13/20 1207  GLUCAP 96 129* 162* 83 147*   Lipid Profile: No results for input(s): CHOL, HDL, LDLCALC, TRIG, CHOLHDL, LDLDIRECT in  the last 72 hours. Thyroid Function Tests: No results for input(s): TSH, T4TOTAL, FREET4, T3FREE, THYROIDAB in the last 72 hours. Anemia Panel: No results for input(s): VITAMINB12, FOLATE, FERRITIN, TIBC, IRON, RETICCTPCT in the last 72 hours. Sepsis Labs: No results for input(s): PROCALCITON, LATICACIDVEN in the last 168 hours.  Recent Results (from the past 240 hour(s))  Resp Panel by RT-PCR (Flu A&B, Covid) Nasopharyngeal Swab     Status: None   Collection Time: 11/11/20  5:50 PM   Specimen: Nasopharyngeal Swab; Nasopharyngeal(NP) swabs in vial transport medium  Result Value Ref Range Status   SARS Coronavirus 2 by RT PCR NEGATIVE NEGATIVE Final    Comment: (NOTE) SARS-CoV-2 target nucleic acids are NOT DETECTED.  The SARS-CoV-2 RNA is generally detectable in upper respiratory specimens during the acute phase of infection. The lowest concentration of SARS-CoV-2 viral copies this assay can detect is 138 copies/mL. A negative result does not preclude SARS-Cov-2 infection and should not be used as the sole basis for treatment or other patient management decisions. A negative result may occur with  improper specimen collection/handling, submission of specimen other than nasopharyngeal swab, presence of viral mutation(s) within the areas targeted by this assay, and inadequate number of viral copies(<138 copies/mL). A negative result must be combined with clinical observations, patient history, and epidemiological information. The expected result is Negative.  Fact Sheet for Patients:  EntrepreneurPulse.com.au  Fact Sheet for Healthcare Providers:  IncredibleEmployment.be  This test is no t yet approved or cleared by the [place] FDA and  has been authorized for detection and/or diagnosis of SARS-CoV-2 by FDA under an Emergency Use Authorization (EUA). This EUA will remain  in effect (meaning this test can be used) for the duration of  the COVID-19 declaration under Section 564(b)(1) of the Act, 21 U.S.C.section 360bbb-3(b)(1), unless the authorization is terminated  or revoked sooner.       Influenza A by PCR NEGATIVE NEGATIVE Final   Influenza B by PCR NEGATIVE NEGATIVE Final    Comment: (NOTE) The Xpert Xpress SARS-CoV-2/FLU/RSV plus assay is intended as an aid in the diagnosis of influenza from Nasopharyngeal swab specimens and should not be used as a sole basis for treatment. Nasal washings and aspirates are unacceptable for Xpert Xpress SARS-CoV-2/FLU/RSV testing.  Fact Sheet for Patients: EntrepreneurPulse.com.au  Fact Sheet for Healthcare Providers: IncredibleEmployment.be  This test is not yet approved or cleared by the [place] FDA and has been authorized for detection and/or diagnosis of SARS-CoV-2 by FDA under an Emergency Use Authorization (EUA). This EUA will remain in effect (meaning this test can be used) for the duration of the COVID-19 declaration under Section 564(b)(1) of the Act, 21 U.S.C. section 360bbb-3(b)(1), unless the authorization is terminated or revoked.  Performed at [facility], [place] [address] [place], [place] [address]  Radiology Studies: No results found.      Scheduled Meds:  allopurinol  300 mg Oral Daily   carvedilol  1.5625 mg Oral BID WC   colchicine  0.6 mg Oral QODAY   dapagliflozin propanediol  5 mg Oral QAC breakfast   enoxaparin (LOVENOX) injection  80 mg Subcutaneous Q12H   gabapentin  300 mg Oral BID   insulin aspart  0-9 Units Subcutaneous TID WC   sertraline  100 mg Oral QHS   Continuous Infusions:  [START ON 11/15/2020]  ceFAZolin (ANCEF) IV       LOS: 1 day    Time spent: 41 minutes spent on chart review, discussion with nursing staff, consultants, updating family and interview/physical exam; more than 50% of that time was spent in counseling and/or coordination of  care.    Saylee Sherrill J [place] DO [facility] Available via Epic secure chat 7am-7pm After these hours, please refer to coverage provider listed on [email or web address] 11/13/2020, 2:07 PM

## 2020-11-13 NOTE — Evaluation (Signed)
Physical Therapy Evaluation Patient Details Name: [name]. MRN: [id number] DOB: 1940-09-26 Today's Date: 11/13/2020  History of Present Illness  Pt is 80 yo male admitted on 11/11/20 with worsening of L2 compression deformity with ~60% height loss and 2 mm retropulsion.  IR consulted with plan for kyphoplasty on 11/15/20 (due to INR and insurance approval).  Noted initially had consult for neurosurgery, but has not been performed.  Spoke with Dr [place] who reports no longer needs neurosurgery consult and ok for OOB activity with brace. Pt with hx of a flutter s/p DCCV, CAD, adrenal adenoma, CKD, diastolic heart failure, DM2, depression, GERD, HTN, falls, sleep apnea.  Clinical Impression   Pt admitted with above diagnosis. At baseline, pt reports he is independent without AD.  Today, pt lethargic and up in chair at arrival.  PT donned brace in chair. Pt only able to step pivot to/from [facility] with min-mod A and increased cues.  Limited due to pain and lethargy.  At this time recommend SNF - will further assess after kyphoplasty.  Pt currently with functional limitations due to the deficits listed below (see PT Problem List). Pt will benefit from skilled PT to increase their independence and safety with mobility to allow discharge to the venue listed below.          Recommendations for follow up therapy are one component of a multi-disciplinary discharge planning process, led by the attending physician.  Recommendations may be updated based on patient status, additional functional criteria and insurance authorization.  Follow Up Recommendations Skilled nursing-short term rehab (<3 hours/day)    Assistance Recommended at Discharge Frequent or constant Supervision/Assistance  Functional Status Assessment Patient has had a recent decline in their functional status and demonstrates the ability to make significant improvements in function in a reasonable and predictable amount of time.  Equipment  Recommendations  Rolling walker (2 wheels)    Recommendations for Other Services       Precautions / Restrictions Precautions Precautions: Fall;Back Precaution Booklet Issued: No (too lethargic) Required Braces or Orthoses: Spinal Brace Spinal Brace: Thoracolumbosacral orthotic;Applied in sitting position      Mobility  Bed Mobility               General bed mobility comments: in chair at arrival (nurse tech got up earlier); donned brace in chair    Transfers Overall transfer level: Needs assistance Equipment used: Rolling walker (2 wheels) Transfers: Sit to/from Stand;Bed to chair/wheelchair/BSC Sit to Stand: Min assist   Step pivot transfers: Mod assist       General transfer comment: Sit to stand with min physical assist but mod cues for hand placement and back precautions; performed sit to stand x 2; Step pivot with RW to bsc and back to recliner with mod A to steady and cues; total assist with ADLs (pt had BM)    Ambulation/Gait                  Stairs            Wheelchair Mobility    Modified Rankin (Stroke Patients Only)       Balance Overall balance assessment: Needs assistance Sitting-balance support: No upper extremity supported Sitting balance-Leahy Scale: Fair     Standing balance support: Bilateral upper extremity supported;Reliant on assistive device for balance Standing balance-Leahy Scale: Poor Standing balance comment: requiring RW and min-mod A  Pertinent Vitals/Pain Pain Assessment: 0-10 Pain Score: 8  Pain Location: Back and radiates down L leg at times Pain Descriptors / Indicators: Sharp Pain Intervention(s): Limited activity within patient's tolerance;Monitored during session;Repositioned;Relaxation    Home Living Family/patient expects to be discharged to:: Unsure Living Arrangements: Spouse/significant other Available Help at Discharge: Family;Available 24  hours/day Type of Home: House Home Access: Stairs to enter Entrance Stairs-Rails: None Entrance Stairs-Number of Steps: 3   Home Layout: One level Home Equipment: Cane - single point      Prior Function Prior Level of Function : Independent/Modified Independent             Mobility Comments: Ambulated without AD (at times uses cane); could ambulate in community ADLs Comments: Pt independent with ADLs and IADLs     Hand Dominance        Extremity/Trunk Assessment   Upper Extremity Assessment Upper Extremity Assessment: Overall WFL for tasks assessed (Did not MMT due to compression fx)    Lower Extremity Assessment Lower Extremity Assessment:  (ROM WFL; MMT at least 3/5 but not further tested due to compression fx and pain)    Cervical / Trunk Assessment Cervical / Trunk Assessment: Kyphotic;Other exceptions Cervical / Trunk Exceptions: Compression fx - TLSO when OOB  Communication   Communication: No difficulties  Cognition Arousal/Alertness: Lethargic Behavior During Therapy: WFL for tasks assessed/performed Overall Cognitive Status: No family/caregiver present to determine baseline cognitive functioning                                 General Comments: Pt following basic commands but needs cues for back precautions.  Able to answer PLOF but unaware of current date stated Nov 2023. Very lethargic        General Comments      Exercises     Assessment/Plan    PT Assessment Patient needs continued PT services  PT Problem List Decreased strength;Decreased mobility;Decreased safety awareness;Decreased range of motion;Decreased knowledge of precautions;Decreased activity tolerance;Decreased balance;Decreased knowledge of use of DME;Pain       PT Treatment Interventions DME instruction;Therapeutic activities;Gait training;Therapeutic exercise;Patient/family education;Balance training;Functional mobility training;Modalities    PT Goals (Current  goals can be found in the Care Plan section)  Acute Rehab PT Goals Patient Stated Goal: decrease pain PT Goal Formulation: With patient Time For Goal Achievement: 11/27/20 Potential to Achieve Goals: Good    Frequency Min 3X/week   Barriers to discharge        Co-evaluation               AM-PAC PT "6 Clicks" Mobility  Outcome Measure Help needed turning from your back to your side while in a flat bed without using bedrails?: A Lot Help needed moving from lying on your back to sitting on the side of a flat bed without using bedrails?: A Lot Help needed moving to and from a bed to a chair (including a wheelchair)?: A Lot Help needed standing up from a chair using your arms (e.g., wheelchair or bedside chair)?: A Lot Help needed to walk in hospital room?: A Lot Help needed climbing 3-5 steps with a railing? : Total 6 Click Score: 11    End of Session Equipment Utilized During Treatment: Back brace;Gait belt Activity Tolerance: Patient tolerated treatment well Patient left: with chair alarm set;in chair;with call bell/phone within reach Nurse Communication: Mobility status PT Visit Diagnosis: Unsteadiness on feet (R26.81);Other abnormalities of gait and  mobility (R26.89);Muscle weakness (generalized) (M62.81)    Time: 3664-4034 PT Time Calculation (min) (ACUTE ONLY): 29 min   Charges:   PT Evaluation $PT Eval Moderate Complexity: 1 Mod PT Treatments $Therapeutic Activity: 8-22 mins        Abran Richard, PT Acute Rehab Services Pager [phone] [facility] [phone]   Karlton Lemon 11/13/2020, 1:03 PM

## 2020-11-13 NOTE — Plan of Care (Signed)
?  Problem: Activity: ?Goal: Risk for activity intolerance will decrease ?Outcome: Progressing ?  ?Problem: Safety: ?Goal: Ability to remain free from injury will improve ?Outcome: Progressing ?  ?Problem: Pain Managment: ?Goal: General experience of comfort will improve ?Outcome: Progressing ?  ?

## 2020-11-14 ENCOUNTER — Inpatient Hospital Stay (HOSPITAL_COMMUNITY): Payer: Medicare Other

## 2020-11-14 ENCOUNTER — Ambulatory Visit: Payer: Medicare Other | Admitting: Internal Medicine

## 2020-11-14 DIAGNOSIS — Z9989 Dependence on other enabling machines and devices: Secondary | ICD-10-CM

## 2020-11-14 DIAGNOSIS — G4733 Obstructive sleep apnea (adult) (pediatric): Secondary | ICD-10-CM

## 2020-11-14 HISTORY — PX: IR KYPHO LUMBAR INC FX REDUCE BONE BX UNI/BIL CANNULATION INC/IMAGING: IMG5519

## 2020-11-14 LAB — BASIC METABOLIC PANEL
Anion gap: 9 (ref 5–15)
BUN: 39 mg/dL — ABNORMAL HIGH (ref 8–23)
CO2: 20 mmol/L — ABNORMAL LOW (ref 22–32)
Calcium: 8.5 mg/dL — ABNORMAL LOW (ref 8.9–10.3)
Chloride: 106 mmol/L (ref 98–111)
Creatinine, Ser: 1.38 mg/dL — ABNORMAL HIGH (ref 0.61–1.24)
GFR, Estimated: 51 mL/min — ABNORMAL LOW (ref >60)
Glucose, Bld: 161 mg/dL — ABNORMAL HIGH (ref 70–99)
Potassium: 4.7 mmol/L (ref 3.5–5.1)
Sodium: 135 mmol/L (ref 135–145)

## 2020-11-14 LAB — GLUCOSE, CAPILLARY
Glucose-Capillary: 184 mg/dL — ABNORMAL HIGH (ref 70–99)
Glucose-Capillary: 149 mg/dL — ABNORMAL HIGH (ref 70–99)
Glucose-Capillary: 170 mg/dL — ABNORMAL HIGH (ref 70–99)
Glucose-Capillary: 140 mg/dL — ABNORMAL HIGH (ref 70–99)

## 2020-11-14 LAB — PROTIME-INR
INR: 1.5 — ABNORMAL HIGH (ref 0.8–1.2)
Prothrombin Time: 18 seconds — ABNORMAL HIGH (ref 11.4–15.2)

## 2020-11-14 LAB — CBC
HCT: 42 % (ref 39.0–52.0)
Hemoglobin: 13.1 g/dL (ref 13.0–17.0)
MCH: 30.7 pg (ref 26.0–34.0)
MCHC: 31.2 g/dL (ref 30.0–36.0)
MCV: 98.4 fL (ref 80.0–100.0)
Platelets: 195 10*3/uL (ref 150–400)
RBC: 4.27 MIL/uL (ref 4.22–5.81)
RDW: 17 % — ABNORMAL HIGH (ref 11.5–15.5)
WBC: 12.3 10*3/uL — ABNORMAL HIGH (ref 4.0–10.5)
nRBC: 0 % (ref 0.0–0.2)

## 2020-11-14 MED ORDER — FENTANYL CITRATE (PF) 100 MCG/2ML IJ SOLN
INTRAMUSCULAR | Status: AC | PRN
  Administered 2020-11-14 (×2): 50 ug via INTRAVENOUS

## 2020-11-14 MED ORDER — LIDOCAINE HCL (PF) 1 % IJ SOLN
INTRAMUSCULAR | Status: AC | PRN
  Administered 2020-11-14 (×2): 10 mL

## 2020-11-14 MED ORDER — ENOXAPARIN SODIUM 80 MG/0.8ML IJ SOSY
80.0000 mg | PREFILLED_SYRINGE | Freq: Two times a day (BID) | INTRAMUSCULAR | Status: DC
Start: 2020-11-14 — End: 2020-11-15
  Administered 2020-11-14 – 2020-11-15 (×2): 80 mg via SUBCUTANEOUS
  Filled 2020-11-14 (×2): qty 0.8

## 2020-11-14 MED ORDER — FENTANYL CITRATE (PF) 100 MCG/2ML IJ SOLN
INTRAMUSCULAR | Status: AC
  Filled 2020-11-14: qty 4

## 2020-11-14 MED ORDER — MIDAZOLAM HCL 2 MG/2ML IJ SOLN
INTRAMUSCULAR | Status: AC
  Filled 2020-11-14: qty 6

## 2020-11-14 MED ORDER — LIDOCAINE HCL (PF) 1 % IJ SOLN
INTRAMUSCULAR | Status: AC
  Filled 2020-11-14: qty 30

## 2020-11-14 MED ORDER — WARFARIN SODIUM 5 MG PO TABS
10.0000 mg | ORAL_TABLET | Freq: Once | ORAL | Status: AC
  Administered 2020-11-14: 10 mg via ORAL
  Filled 2020-11-14: qty 2

## 2020-11-14 MED ORDER — CEFAZOLIN SODIUM-DEXTROSE 2-4 GM/100ML-% IV SOLN
INTRAVENOUS | Status: AC
  Filled 2020-11-14: qty 100

## 2020-11-14 MED ORDER — IOHEXOL 300 MG/ML  SOLN
25.0000 mL | Freq: Once | INTRAMUSCULAR | Status: AC | PRN
  Administered 2020-11-14: 1 mL

## 2020-11-14 MED ORDER — MIDAZOLAM HCL 2 MG/2ML IJ SOLN
INTRAMUSCULAR | Status: AC | PRN
  Administered 2020-11-14: 1 mg via INTRAVENOUS
  Administered 2020-11-14: .5 mg via INTRAVENOUS

## 2020-11-14 MED ORDER — WARFARIN - PHARMACIST DOSING INPATIENT: Freq: Every day | Status: DC

## 2020-11-14 NOTE — Progress Notes (Signed)
[place]. Presented with L2 compression fracture s/p frequent falls at home.  Patient is on [place] PTA for T2DM. Currently NPO for possible kyphoplasty.    Based upon the approved hold criteria below for SGLT2 inhibitors, [place] will be held until diet is resumed.   Acute renal failure eGFR < 45 mL/min/1.92m (canagliflozin INVOKANA, ertugliflozin STEGLATRO) eGFR < 30 mL/min/1.729m(empagliflozin JARDIANCE, dapagliflozin FARXIGA) Diabetic ketoacidosis Metabolic acidosis NPO status UTI Dehydration Volume depletion  Pharmacy will continue to follow and resume [place] appropriate.  MaDimple NanasPharmD 11/14/2020 10:08 AM

## 2020-11-14 NOTE — Procedures (Signed)
Interventional Radiology Procedure Note  Procedure: L2 kyphoplasty  Findings: Please refer to procedural dictation for full description. Bi-pedicular approach with approximately 8 mL cement.  Complications: None immediate  Estimated Blood Loss: < 5 ml  Recommendations: Bedrest for 2 hours, then ambulate as tolerated. IR will follow.   Ruthann Cancer, MD Pager: [phone]

## 2020-11-14 NOTE — Progress Notes (Signed)
, PROGRESS NOTE    [name]  GBE:[id number] DOB: [birth year] DOA: 11/11/2020 PCP: [name], MD    Brief Narrative:  [name]. is a 80 year old male with past medical history significant for atrial flutter s/p DCCV, CAD, adrenal adenoma, CKD stage IIIa, chronic diastolic congestive heart failure, type 2 diabetes mellitus, depression, GERD, essential hypertension, OSA, multiple falls with recent L2 compression fracture treated with TLSO brace and pain medication who presents to [facility] ED on 11/7 with continued low back pain following recurrent fall.  In the ED, afebrile, BP 160/107, creatinine 1.52, WBC 16.2, INR 3.4, COVID-19 PCR/influenza A/B PCR negative.  CT head/C-spine with no acute intracranial abnormality and no cervical fracture/listhesis.  CT L-spine showed interval progression of compression deformity L2 with vertebral body now approximately 6% height loss with approximately 2 mm retropulsion into spinal canal of the superior endplate.  Hip x-rays with no acute findings.  IR was consulted for kyphoplasty.  Duration consulted for further evaluation and management of acute pain secondary to recurrent falls with lumbar compression fracture.   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  CT L-spine notable for interval progression of L2 compression fracture now with 50% height loss with 2 mm retropulsion into the spinal canal of the superior endplate.  Patient without bowel or bladder incontinence and no weakness. --IR consulted for kyphoplasty, plan today --Continue TLSO brace --Oxycodone-acetaminophen 5-325 mg, 1-2 tablets q4h PRN moderate pain --Morphine 1-2 mg IV every 4 hours severe pain --PT/OT currently recommending SNF placement, TOC for evaluation  Essential hypertension BP 161/93 this morning, well controlled. --Carvedilol reduced to 1.5625 mg twice daily --Continue to monitor BP closely  Paroxysmal atrial fibrillation s/P MAZE procedure Hx atrial flutter s/p DCCV On anticoagulation with Coumadin outpatient. --Holding Coumadin for planned kyphoplasty as above --Carvedilol reduced to 1.5625 mg twice daily --Continue monitor on telemetry  Chronic diastolic congestive heart failure, compensated Home medications include metolazone 5 mg p.o. daily as needed, carvedilol 3.125 mg p.o. twice daily, torsemide 100 mg p.o. daily. --Continue carvedilol at reduced dose 1.5625gm BID --Holding home metolazone/torsemide --Strict I's and O's and daily weights  Supratherapeutic INR On Coumadin outpatient for A. fib/flutter.  INR 3.4 on admission, s/p 1 dose of vitamin K IV. --INR 3.4>1.7>1.5 --Repeat INR in a.m.  CKD stage IIIa --Cr 1.52>1.34>1.51>1.38, stable --Avoid nephrotoxins, renal dose all medications  Hyperlipidemia On atorvastatin 20 mg 3 times weekly on Monday/Wednesday/Friday. --Hold statin for now  Type 2 diabetes mellitus Home regimen includes NPH 70/30 45 units subcutaneously daily with breakfast and Farxiga 5 mg p.o. daily.  Hemoglobin A1c 6.3, well controlled. --Farxiga 5 mg p.o. daily --Holding home NPH --Sensitive SSI for coverage --CBGs qAC/HS  Diabetic neuropathy: Gabapentin 3 mg  p.o. twice daily  Hx gout:  Allopurinol 3 mg p.o. daily  Anxiety/depression: --Zoloft 1 mg p.o. daily --Xanax 0.5 mg p.o. nightly as needed  DVT prophylaxis:   SCDs, holding chemical DVT prophylaxis for planned kyphoplasty   Code Status: Full Code Family Communication: No family present at bedside this morning, updated patient's spouse, Reta via telephone this morning  Disposition Plan:  Level of care: Med-Surg Status is: Inpatient  Remains inpatient appropriate because: Pending kyphoplasty, likely needs SNF placement   Consultants:  Interventional radiology  Procedures:  None  Antimicrobials:  None   Subjective: Patient seen examined bedside, resting comfortably.  Currently using bedside commode.  IR plans kyphoplasty likely today.  Seen by PT/OT with recommendations of SNF placement.  No other questions or concerns at this time.  Denies headache, no fever/chills/night sweats, no nausea/vomiting/diarrhea, no chest pain, no palpitations, no abdominal pain, no cough/congestion.  No acute events overnight per nurse staff.  Objective: Vitals:   11/13/20 1408 11/13/20 2054 11/14/20 0549 11/14/20 1045  BP: 133/79 (!) 157/98 (!) 161/93 134/89  Pulse: 77 76 90 78  Resp: 19 17 16 16   Temp: (!) 97.4 F (36.3 C) 97.8 F (36.6 C) 97.7 F (36.5 C) 99.1 F (37.3 C)  TempSrc: Oral Oral  Oral  SpO2: 94% 99% 96% 94%  Weight:      Height:        Intake/Output Summary (Last 24 hours) at 11/14/2020 1153 Last data filed at 11/14/2020 1050 Gross per 24 hour  Intake 371.67 ml  Output 700 ml  Net -328.33 ml   Filed Weights   11/11/20 1157  Weight: 78 kg    Examination:  General exam: Appears calm and comfortable  Respiratory system: Clear to auscultation. Respiratory effort normal.  On room air Cardiovascular system: S1 & S2 heard, RRR. No JVD, murmurs, rubs, gallops or clicks. No pedal edema. Gastrointestinal system: Abdomen is nondistended, soft and nontender. No organomegaly or masses felt. Normal  bowel sounds heard. Central nervous system: Alert and oriented. No focal neurological deficits. Extremities: Symmetric 5 x 5 power. Skin: No rashes, lesions or ulcers Psychiatry: Judgement and insight appear normal. Mood & affect appropriate.     Data Reviewed: I have personally reviewed following labs and imaging studies  CBC: Recent Labs  Lab 11/11/20 1414 11/12/20 0309 11/13/20 0321 11/14/20 0308  WBC 16.2* 10.9* 12.2* 12.3*  NEUTROABS 14.2*  --   --   --   HGB 13.3 12.5* 12.3* 13.1  HCT 41.2 39.6 39.6 42.0  MCV 96.5 97.1 97.8 98.4  PLT 187 175 198 093   Basic Metabolic Panel: Recent Labs  Lab 11/11/20 1414 11/12/20 0309 11/13/20 0321 11/14/20 0308  NA 137 136 134* 135  K 4.8 4.6 4.7 4.7  CL 107 108 105 106  CO2 23 17* 22 20*  GLUCOSE 71 111* 99 161*  BUN 41* 38* 35* 39*  CREATININE 1.52* 1.34* 1.51* 1.38*  CALCIUM 8.7* 8.3* 8.5* 8.5*   GFR: Estimated Creatinine Clearance: 44.7 mL/min (A) (by C-G formula based on SCr of 1.38 mg/dL (H)). Liver Function Tests: Recent Labs  Lab 11/12/20 0309  AST 19  ALT 27  ALKPHOS 195*  BILITOT 1.3*  PROT 6.2*  ALBUMIN 3.4*   No results for input(s): LIPASE, AMYLASE in the last 168 hours. No results for input(s): AMMONIA in the last 168 hours. Coagulation Profile: Recent Labs  Lab 11/11/20 1414 11/12/20 0309 11/12/20 1736 11/13/20 0321 11/14/20 0308  INR 3.4* 3.4* 2.3*  1.7* 1.5*   Cardiac Enzymes: No results for input(s): CKTOTAL, CKMB, CKMBINDEX, TROPONINI in the last 168 hours. BNP (last 3 results) No results for input(s): PROBNP in the last 8760 hours. HbA1C: No results for input(s): HGBA1C in the last 72 hours. CBG: Recent Labs  Lab 11/13/20 1207 11/13/20 1707 11/13/20 2057 11/14/20 0754 11/14/20 1124  GLUCAP 147* 208* 173* 184* 149*   Lipid Profile: No results for input(s): CHOL, HDL, LDLCALC, TRIG, CHOLHDL, LDLDIRECT in the last 72 hours. Thyroid Function Tests: No results for input(s): TSH,  T4TOTAL, FREET4, T3FREE, THYROIDAB in the last 72 hours. Anemia Panel: No results for input(s): VITAMINB12, FOLATE, FERRITIN, TIBC, IRON, RETICCTPCT in the last 72 hours. Sepsis Labs: No results for input(s): PROCALCITON, LATICACIDVEN in the last 168 hours.  Recent Results (from the past 240 hour(s))  Resp Panel by RT-PCR (Flu A&B, Covid) Nasopharyngeal Swab     Status: None   Collection Time: 11/11/20  5:50 PM   Specimen: Nasopharyngeal Swab; Nasopharyngeal(NP) swabs in vial transport medium  Result Value Ref Range Status   SARS Coronavirus 2 by RT PCR NEGATIVE NEGATIVE Final    Comment: (NOTE) SARS-CoV-2 target nucleic acids are NOT DETECTED.  The SARS-CoV-2 RNA is generally detectable in upper respiratory specimens during the acute phase of infection. The lowest concentration of SARS-CoV-2 viral copies this assay can detect is 138 copies/mL. A negative result does not preclude SARS-Cov-2 infection and should not be used as the sole basis for treatment or other patient management decisions. A negative result may occur with  improper specimen collection/handling, submission of specimen other than nasopharyngeal swab, presence of viral mutation(s) within the areas targeted by this assay, and inadequate number of viral copies(<138 copies/mL). A negative result must be combined with clinical observations, patient history, and epidemiological information. The expected result is Negative.  Fact Sheet for Patients:  EntrepreneurPulse.com.au  Fact Sheet for Healthcare Providers:  IncredibleEmployment.be  This test is no t yet approved or cleared by the [place] FDA and  has been authorized for detection and/or diagnosis of SARS-CoV-2 by FDA under an Emergency Use Authorization (EUA). This EUA will remain  in effect (meaning this test can be used) for the duration of the COVID-19 declaration under Section 564(b)(1) of the Act, 21 U.S.C.section  360bbb-3(b)(1), unless the authorization is terminated  or revoked sooner.       Influenza A by PCR NEGATIVE NEGATIVE Final   Influenza B by PCR NEGATIVE NEGATIVE Final    Comment: (NOTE) The Xpert Xpress SARS-CoV-2/FLU/RSV plus assay is intended as an aid in the diagnosis of influenza from Nasopharyngeal swab specimens and should not be used as a sole basis for treatment. Nasal washings and aspirates are unacceptable for Xpert Xpress SARS-CoV-2/FLU/RSV testing.  Fact Sheet for Patients: EntrepreneurPulse.com.au  Fact Sheet for Healthcare Providers: IncredibleEmployment.be  This test is not yet approved or cleared by the [place] FDA and has been authorized for detection and/or diagnosis of SARS-CoV-2 by FDA under an Emergency Use Authorization (EUA). This EUA will remain in effect (meaning this test can be used) for the duration of the COVID-19 declaration under Section 564(b)(1) of the Act, 21 U.S.C. section 360bbb-3(b)(1), unless the authorization is terminated or revoked.  Performed at [facility], [place] [address] [place], [place] [address]          Radiology Studies: No results found.      Scheduled Meds:  allopurinol  300 mg Oral Daily   carvedilol  1.5625 mg  Oral BID WC   gabapentin  300 mg Oral BID   insulin aspart  0-9 Units Subcutaneous TID WC   sertraline  100 mg Oral QHS   Continuous Infusions:  sodium chloride 75 mL/hr at 11/14/20 0108    ceFAZolin (ANCEF) IV 2 g (11/14/20 1132)     LOS: 2 days    Time spent: 38 minutes spent on chart review, discussion with nursing staff, consultants, updating family and interview/physical exam; more than 50% of that time was spent in counseling and/or coordination of care.    Gillis Boardley J [place] DO [facility] Available via Epic secure chat 7am-7pm After these hours, please refer to coverage provider listed on [email or web address] 11/14/2020, 11:53 AM

## 2020-11-14 NOTE — Plan of Care (Signed)
Plan of care reviewed and discussed with the patient. 

## 2020-11-14 NOTE — Progress Notes (Signed)
MEDICATION-RELATED CONSULT NOTE   IR Procedure Consult - Anticoagulant/Antiplatelet PTA/Inpatient Med List Review by Pharmacist    Procedure: L2 kyphoplasty    Completed: 11/14/20 at 14:03  Post-Procedural bleeding risk per IR MD assessment:  standard  Antithrombotic medications on inpatient or PTA profile prior to procedure:   lovenox    Recommended restart time per IR Post-Procedure Guidelines:  6hr post-procedure on day 0   Other considerations:      Plan:    Resume lovenox 80mg  SQ q12h 6hr post procedure tonight as well as warfarin 10mg  x 1.   Peggyann Juba, PharmD, BCPS Pharmacy: [phone] 11/14/2020 2:10 PM

## 2020-11-14 NOTE — Plan of Care (Signed)
  Problem: Clinical Measurements: Goal: Ability to maintain clinical measurements within normal limits will improve Outcome: Progressing   Problem: Clinical Measurements: Goal: Will remain free from infection Outcome: Progressing   Problem: Activity: Goal: Risk for activity intolerance will decrease Outcome: Progressing

## 2020-11-15 DIAGNOSIS — I272 Pulmonary hypertension, unspecified: Secondary | ICD-10-CM

## 2020-11-15 LAB — GLUCOSE, CAPILLARY
Glucose-Capillary: 207 mg/dL — ABNORMAL HIGH (ref 70–99)
Glucose-Capillary: 141 mg/dL — ABNORMAL HIGH (ref 70–99)
Glucose-Capillary: 130 mg/dL — ABNORMAL HIGH (ref 70–99)
Glucose-Capillary: 113 mg/dL — ABNORMAL HIGH (ref 70–99)

## 2020-11-15 LAB — BASIC METABOLIC PANEL
Anion gap: 12 (ref 5–15)
BUN: 42 mg/dL — ABNORMAL HIGH (ref 8–23)
CO2: 19 mmol/L — ABNORMAL LOW (ref 22–32)
Calcium: 8.8 mg/dL — ABNORMAL LOW (ref 8.9–10.3)
Chloride: 104 mmol/L (ref 98–111)
Creatinine, Ser: 1.5 mg/dL — ABNORMAL HIGH (ref 0.61–1.24)
GFR, Estimated: 46 mL/min — ABNORMAL LOW (ref >60)
Glucose, Bld: 165 mg/dL — ABNORMAL HIGH (ref 70–99)
Potassium: 4.4 mmol/L (ref 3.5–5.1)
Sodium: 135 mmol/L (ref 135–145)

## 2020-11-15 LAB — PROTIME-INR
INR: 1.5 — ABNORMAL HIGH (ref 0.8–1.2)
Prothrombin Time: 17.7 seconds — ABNORMAL HIGH (ref 11.4–15.2)

## 2020-11-15 MED ORDER — INSULIN GLARGINE-YFGN 100 UNIT/ML ~~LOC~~ SOLN
5.0000 [IU] | Freq: Every day | SUBCUTANEOUS | Status: DC
  Administered 2020-11-15 – 2020-11-18 (×4): 5 [IU] via SUBCUTANEOUS
  Filled 2020-11-15 (×4): qty 0.05

## 2020-11-15 MED ORDER — WARFARIN SODIUM 5 MG PO TABS
7.5000 mg | ORAL_TABLET | Freq: Once | ORAL | Status: AC
  Administered 2020-11-15: 7.5 mg via ORAL
  Filled 2020-11-15: qty 1

## 2020-11-15 MED ORDER — DAPAGLIFLOZIN PROPANEDIOL 5 MG PO TABS
5.0000 mg | ORAL_TABLET | Freq: Every day | ORAL | Status: DC
  Administered 2020-11-16 – 2020-11-19 (×4): 5 mg via ORAL
  Filled 2020-11-15 (×4): qty 1

## 2020-11-15 NOTE — Plan of Care (Signed)
  Problem: Education: Goal: Knowledge of General Education information will improve Description: Including pain rating scale, medication(s)/side effects and non-pharmacologic comfort measures Outcome: Progressing   Problem: Pain Managment: Goal: General experience of comfort will improve Outcome: Progressing   Problem: Safety: Goal: Ability to remain free from injury will improve Outcome: Progressing   

## 2020-11-15 NOTE — Consult Note (Signed)
[facility] CM Inpatient Consult   11/15/2020  Kaycee July 13, 1940 [id number]  [facility] CM)   Patient evaluated for community based chronic complex disease management services with [facility] with noted high risk score for unplanned readmission.   Per review, current recommendation is for skilled nursing facility rehab.   Plan: Continue to follow for progression.  Of note, [facility] Care Management services does not replace or interfere with any services that are arranged by inpatient case management or social work.   Netta Cedars, MSN, RN [facility] [employer] [phone]  Toll free office [phone]

## 2020-11-15 NOTE — Progress Notes (Signed)
, PROGRESS NOTE    [name].  SFK:[id number] DOB: 03-Jun-1940 DOA: 11/11/2020 PCP: [name], MD    Brief Narrative:  [name]. is a 80 year old male with past medical history significant for atrial flutter s/p DCCV, CAD, adrenal adenoma, CKD stage IIIa, chronic diastolic congestive heart failure, type 2 diabetes mellitus, depression, GERD, essential hypertension, OSA, multiple falls with recent L2 compression fracture treated with TLSO brace and pain medication who presents to [facility] ED on 11/7 with continued low back pain following recurrent fall.  In the ED, afebrile, BP 160/107, creatinine 1.52, WBC 16.2, INR 3.4, COVID-19 PCR/influenza A/B PCR negative.  CT head/C-spine with no acute intracranial abnormality and no cervical fracture/listhesis.  CT L-spine showed interval progression of compression deformity L2 with vertebral body now approximately 6% height loss with approximately 2 mm retropulsion into spinal canal of the superior endplate.  Hip x-rays with no acute findings.  IR was consulted for kyphoplasty.  Duration consulted for further evaluation and management of acute pain secondary to recurrent falls with lumbar compression fracture.   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  CT L-spine notable for interval progression of L2 compression fracture now with 50% height loss with 2 mm retropulsion into the spinal canal of the superior endplate.  Patient without bowel or bladder incontinence and no weakness.  Patient underwent kyphoplasty L2 segment by interventional radiology, Dr. Serafina Royals on 11/14/2020. --Continue TLSO brace --Oxycodone-acetaminophen 5-325 mg, 1-2 tablets q4h PRN moderate pain --Morphine 1-2 mg IV every 4 hours severe pain --PT/OT currently recommending SNF placement, TOC for management  Essential hypertension BP 124/80 this morning, well controlled. --Carvedilol reduced to 1.5625 mg twice daily --Continue to monitor BP closely  Paroxysmal atrial fibrillation s/P MAZE procedure Hx atrial flutter s/p DCCV On anticoagulation with Coumadin outpatient. -- Resume Coumadin today, pharmacy consulted --Carvedilol reduced to 1.5625 mg twice daily --Continue monitor on telemetry  Chronic diastolic congestive heart failure, compensated Home medications include metolazone 5 mg p.o. daily as needed, carvedilol 3.125 mg p.o. twice daily, torsemide 100 mg p.o. daily. --Continue carvedilol at reduced dose 1.5625gm BID --Holding home metolazone/torsemide --Strict I's and O's and daily weights  Supratherapeutic INR On Coumadin outpatient for A. fib/flutter.  INR 3.4 on admission, s/p 1 dose of vitamin K IV. --INR 3.4>1.7>1.5>1.5  --Resume Coumadin today --INR daily  CKD stage IIIa --Cr 1.52>1.34>1.51>1.38>1.50, stable (baseline 1.6-2.2) --Avoid nephrotoxins, renal dose all medications  Hyperlipidemia On atorvastatin 20 mg 3 times weekly on Monday/Wednesday/Friday. --Hold statin for now  Type 2 diabetes mellitus Home regimen includes NPH 70/30 45 units subcutaneously daily with breakfast and Farxiga 5 mg p.o. daily.  Hemoglobin A1c 6.3, well controlled. --Farxiga 5 mg p.o. daily --Holding home NPH --Start [employer]  5u [name]  daily --Sensitive SSI for coverage --CBGs qAC/HS  Diabetic neuropathy: Gabapentin 300 mg p.o. twice daily  Hx gout: Allopurinol 300 mg p.o. daily  Anxiety/depression: --Zoloft 1 mg p.o. daily --Xanax 0.5 mg p.o. nightly as needed  DVT prophylaxis:  warfarin (COUMADIN) tablet 7.5 mg    Code Status: Full Code Family Communication: No family present at bedside this morning, updated patient's spouse, Reta via telephone this morning  Disposition Plan:  Level of care: Med-Surg Status is: Inpatient  Remains inpatient appropriate because: Pending SNF placement   Consultants:  Interventional radiology  Procedures:  L2 kyphoplasty, interventional radiology, Dr. Serafina Royals; 11/14/20  Antimicrobials:  None   Subjective: Patient seen examined bedside, resting comfortably.  Reports pain much improved following kyphoplasty yesterday.  Awaiting reevaluation by PT/OT with likely need of SNF placement.  Updated patient's spouse via telephone this morning.  No other questions or concerns at this time.  Denies headache, no fever/chills/night sweats, no nausea/vomiting/diarrhea, no chest pain, no palpitations, no abdominal pain, no cough/congestion.  No acute events overnight per nurse staff.  Objective: Vitals:   11/14/20 1701 11/14/20 2052 11/14/20 2221 11/15/20 0329  BP: 134/90 (!) 156/93  129/80  Pulse: 79 79 73 85  Resp: 18 18 18 20   Temp:  98.5 F (36.9 C)  98.1 F (36.7 C)  TempSrc:  Oral  Oral  SpO2: 96% 91% 97% 93%  Weight:      Height:        Intake/Output Summary (Last 24 hours) at 11/15/2020 1009 Last data filed at 11/15/2020 0600 Gross per 24 hour  Intake 1622.16 ml  Output 250 ml  Net 1372.16 ml   Filed Weights   11/11/20 1157  Weight: 78 kg    Examination:  General exam: Appears calm and comfortable  Respiratory system: Clear to auscultation. Respiratory effort normal.  On room air Cardiovascular system: S1 & S2 heard, RRR. No JVD, murmurs, rubs, gallops or  clicks. No pedal edema. Gastrointestinal system: Abdomen is nondistended, soft and nontender. No organomegaly or masses felt. Normal bowel sounds heard. Central nervous system: Alert and oriented. No focal neurological deficits. Extremities: Symmetric 5 x 5 power. Skin: No rashes, lesions or ulcers Psychiatry: Judgement and insight appear normal. Mood & affect appropriate.     Data Reviewed: I have personally reviewed following labs and imaging studies  CBC: Recent Labs  Lab 11/11/20 1414 11/12/20 0309 11/13/20 0321 11/14/20 0308  WBC 16.2* 10.9* 12.2* 12.3*  NEUTROABS 14.2*  --   --   --   HGB 13.3 12.5* 12.3* 13.1  HCT 41.2 39.6 39.6 42.0  MCV 96.5 97.1 97.8 98.4  PLT 187 175 198 053   Basic Metabolic Panel: Recent Labs  Lab 11/11/20 1414 11/12/20 0309 11/13/20 0321 11/14/20 0308 11/15/20 0524  NA 137 136 134* 135 135  K 4.8 4.6 4.7 4.7 4.4  CL 107 108 105 106 104  CO2 23 17* 22 20* 19*  GLUCOSE 71 111* 99 161* 165*  BUN 41* 38* 35* 39* 42*  CREATININE 1.52* 1.34* 1.51* 1.38* 1.50*  CALCIUM 8.7* 8.3* 8.5* 8.5* 8.8*   GFR: Estimated Creatinine Clearance: 41.1 mL/min (A) (by C-G formula based on SCr of 1.5 mg/dL (H)). Liver Function Tests: Recent Labs  Lab 11/12/20 0309  AST 19  ALT 27  ALKPHOS 195*  BILITOT 1.3*  PROT 6.2*  ALBUMIN 3.4*   No results for input(s): LIPASE, AMYLASE in the last 168 hours. No results for input(s): AMMONIA in the  last 168 hours. Coagulation Profile: Recent Labs  Lab 11/12/20 0309 11/12/20 1736 11/13/20 0321 11/14/20 0308 11/15/20 0524  INR 3.4* 2.3* 1.7* 1.5* 1.5*   Cardiac Enzymes: No results for input(s): CKTOTAL, CKMB, CKMBINDEX, TROPONINI in the last 168 hours. BNP (last 3 results) No results for input(s): PROBNP in the last 8760 hours. HbA1C: No results for input(s): HGBA1C in the last 72 hours. CBG: Recent Labs  Lab 11/14/20 0754 11/14/20 1124 11/14/20 1655 11/14/20 2048 11/15/20 0825  GLUCAP 184* 149*  170* 140* 207*   Lipid Profile: No results for input(s): CHOL, HDL, LDLCALC, TRIG, CHOLHDL, LDLDIRECT in the last 72 hours. Thyroid Function Tests: No results for input(s): TSH, T4TOTAL, FREET4, T3FREE, THYROIDAB in the last 72 hours. Anemia Panel: No results for input(s): VITAMINB12, FOLATE, FERRITIN, TIBC, IRON, RETICCTPCT in the last 72 hours. Sepsis Labs: No results for input(s): PROCALCITON, LATICACIDVEN in the last 168 hours.  Recent Results (from the past 240 hour(s))  Resp Panel by RT-PCR (Flu A&B, Covid) Nasopharyngeal Swab     Status: None   Collection Time: 11/11/20  5:50 PM   Specimen: Nasopharyngeal Swab; Nasopharyngeal(NP) swabs in vial transport medium  Result Value Ref Range Status   SARS Coronavirus 2 by RT PCR NEGATIVE NEGATIVE Final    Comment: (NOTE) SARS-CoV-2 target nucleic acids are NOT DETECTED.  The SARS-CoV-2 RNA is generally detectable in upper respiratory specimens during the acute phase of infection. The lowest concentration of SARS-CoV-2 viral copies this assay can detect is 138 copies/mL. A negative result does not preclude SARS-Cov-2 infection and should not be used as the sole basis for treatment or other patient management decisions. A negative result may occur with  improper specimen collection/handling, submission of specimen other than nasopharyngeal swab, presence of viral mutation(s) within the areas targeted by this assay, and inadequate number of viral copies(<138 copies/mL). A negative result must be combined with clinical observations, patient history, and epidemiological information. The expected result is Negative.  Fact Sheet for Patients:  EntrepreneurPulse.com.au  Fact Sheet for Healthcare Providers:  IncredibleEmployment.be  This test is no t yet approved or cleared by the [place] FDA and  has been authorized for detection and/or diagnosis of SARS-CoV-2 by FDA under an Emergency Use  Authorization (EUA). This EUA will remain  in effect (meaning this test can be used) for the duration of the COVID-19 declaration under Section 564(b)(1) of the Act, 21 U.S.C.section 360bbb-3(b)(1), unless the authorization is terminated  or revoked sooner.       Influenza A by PCR NEGATIVE NEGATIVE Final   Influenza B by PCR NEGATIVE NEGATIVE Final    Comment: (NOTE) The Xpert Xpress SARS-CoV-2/FLU/RSV plus assay is intended as an aid in the diagnosis of influenza from Nasopharyngeal swab specimens and should not be used as a sole basis for treatment. Nasal washings and aspirates are unacceptable for Xpert Xpress SARS-CoV-2/FLU/RSV testing.  Fact Sheet for Patients: EntrepreneurPulse.com.au  Fact Sheet for Healthcare Providers: IncredibleEmployment.be  This test is not yet approved or cleared by the [place] FDA and has been authorized for detection and/or diagnosis of SARS-CoV-2 by FDA under an Emergency Use Authorization (EUA). This EUA will remain in effect (meaning this test can be used) for the duration of the COVID-19 declaration under Section 564(b)(1) of the Act, 21 U.S.C. section 360bbb-3(b)(1), unless the authorization is terminated or revoked.  Performed at [facility], [place] [address] [place], [place] [address]          Radiology  Studies: IR KYPHO LUMBAR INC FX REDUCE BONE BX UNI/BIL CANNULATION INC/IMAGING  Result Date: 11/14/2020 CLINICAL DATA:  80 year old male with acute superior endplate compression fracture of L2 vertebral body. EXAM: FLUOROSCOPIC GUIDED KYPHOPLASTY OF THE L2 VERTEBRAL BODY COMPARISON:  11/11/2020, 11/02/2020 MEDICATIONS: As antibiotic prophylaxis, Ancef 2 gm IV was ordered pre-procedure and administered intravenously within 1 hour of incision. ANESTHESIA/SEDATION: Moderate (conscious) sedation was employed during this procedure. A total of Versed 1.5 mg and Fentanyl 100 mcg was  administered intravenously. Moderate Sedation Time: 40 minutes. The patient's level of consciousness and vital signs were monitored continuously by radiology nursing throughout the procedure under my direct supervision. FLUOROSCOPY TIME:  Five min, 54 seconds (294 mGy) COMPLICATIONS: None immediate. TECHNIQUE: The procedure, risks (including but not limited to bleeding, infection, organ damage), benefits, and alternatives were explained to the patient. Questions regarding the procedure were encouraged and answered. The patient understands and consents to the procedure. The patient was placed prone on the fluoroscopic table. The skin overlying the lumbar region was then prepped and draped in the usual sterile fashion. Maximal barrier sterile technique was utilized including caps, mask, sterile gowns, sterile gloves, sterile drape, hand hygiene and skin antiseptic. Intravenous Fentanyl and Versed were administered as conscious sedation during continuous cardiorespiratory monitoring by the radiology RN. The left pedicle at L2 was then infiltrated with 1% lidocaine followed by the advancement of a Kyphon trocar needle through the left pedicle into the posterior one-third of the vertebral body. Subsequently, the osteo drill was advanced to the anterior third of the vertebral body. The osteo drill was retracted. Through the working cannula, a Kyphon inflatable bone tamp 15 x 3 was advanced and positioned with the distal marker approximately 5 mm from the anterior aspect of the cortex. Appropriate positioning was confirmed on the AP projection. At this time, the balloon was expanded using contrast via a Kyphon inflation syringe device via micro tubing. In similar fashion, the right L2 pedicle was infiltrated with 1% lidocaine followed by the advancement of a second Kyphon trocar needle through the right pedicle into the posterior third of the vertebral body. Subsequently, the osteo drill was coaxially advanced to the  anterior right third. The osteo drill was exchanged for a Kyphon inflatable bone tamp 15 x 3, advanced to the 5 mm of the anterior aspect of the cortex. The balloon was then expanded using contrast as above. Inflations were continued until there was near apposition with the superior end plate. At this time, methylmethacrylate mixture was reconstituted in the Kyphon bone mixing device system. This was then loaded into the delivery mechanism, attached to Kyphon bone fillers. The balloons were deflated and removed followed by the instillation of methylmethacrylate mixture with excellent filling in the AP and lateral projections. No extravasation was noted in the disk spaces or posteriorly into the spinal canal. No epidural venous contamination was seen. The working cannulae and the bone filler were then retrieved and removed. Hemostasis was achieved with manual compression. The patient tolerated the procedure well without immediate postprocedural complication. IMPRESSION: 1. Technically successful L2 vertebral body augmentation using balloon kyphoplasty. 2. Per CMS PQRS reporting requirements (PQRS Measure 24): Given the patient's age of greater than 9 and the fracture site (hip, distal radius, or spine), the patient should be tested for osteoporosis using DXA, and the appropriate treatment considered based on the DXA results. Ruthann Cancer, MD [facility] Radiology Electronically Signed   By: Ruthann Cancer M.D.   On:  11/14/2020 15:16        Scheduled Meds:  allopurinol  300 mg Oral Daily   carvedilol  1.5625 mg Oral BID WC   [START ON 11/16/2020] dapagliflozin propanediol  5 mg Oral QAC breakfast   enoxaparin (LOVENOX) injection  80 mg Subcutaneous Q12H   gabapentin  300 mg Oral BID   insulin aspart  0-9 Units Subcutaneous TID WC   sertraline  100 mg Oral QHS   warfarin  7.5 mg Oral ONCE-1600   Warfarin - Pharmacist Dosing Inpatient   Does not apply q1600    Continuous Infusions:     LOS: 3 days    Time spent: 38 minutes spent on chart review, discussion with nursing staff, consultants, updating family and interview/physical exam; more than 50% of that time was spent in counseling and/or coordination of care.    Leesa Leifheit J [place] DO [facility] Available via Epic secure chat 7am-7pm After these hours, please refer to coverage provider listed on [email or web address] 11/15/2020, 10:09 AM

## 2020-11-15 NOTE — Progress Notes (Signed)
Occupational Therapy Treatment Patient Details Name: [name]. MRN: [id number] DOB: 01/01/1941 Today's Date: 11/15/2020   History of present illness 80 y.o. male presenting to ED 11/7 after a fall out of bed. Patient declines LOC or hitting head. CT head/neck (-) acute intracranial abnormality or cervical fx. CT lumbar spine (+) progression of compression deformity of L2 vertebral body  and ~2 mm retropulsion into the spinal canal of the superior endplate. Cleared by MD to mobilize with therapy 11/9. pt now s/p L2 kyphoplasty 11/10. PMHx significant for Aflutter s/p DCCV, CAD s/p CAGB, adrenal adenoma, CKD IIIa, CHF, DMII, depression, GERD, HTN, OSA and recurrent falls with most recent 10/29 resulting in L2 compression fx treated conservatively with TLSO.   OT comments  Pt with lethargy, myoclonus, and orthostatic hypotension limiting functional abilities and making activities OOB unsafe this session. Pt required Max A for logrolling to sit EOB with mod multimodal cues to perform correct technique. Pt with increasing myclonus sitting EOB and unable to support self in sitting without mod A from therapists and +2 assist to donn TLSO brace. BP sitting EOB 107/79, assisted to log roll back into supine. BP 146/85 in supine. Pt required Max A to wash face bed level. Assisted nurse in toilet hygiene and log rolling after BM. Due to current level of function, updating recommendation to SNF rehab post-d/c.   Recommendations for follow up therapy are one component of a multi-disciplinary discharge planning process, led by the attending physician.  Recommendations may be updated based on patient status, additional functional criteria and insurance authorization.    Follow Up Recommendations  Skilled nursing-short term rehab (<3 hours/day)    Assistance Recommended at Discharge Frequent or constant Supervision/Assistance  Equipment Recommendations  BSC/3in1;Tub/shower bench;Other (comment) (RW)     Recommendations for Other Services      Precautions / Restrictions Precautions Precautions: Fall;Back Precaution Booklet Issued: No Precaution Comments: pt too lethargic at this time to participate in education Required Braces or Orthoses: Spinal Brace Spinal Brace: Thoracolumbosacral orthotic;Applied in sitting position Restrictions Weight Bearing Restrictions: No       Mobility Bed Mobility Overal bed mobility: Needs Assistance Bed Mobility: Rolling;Sidelying to Sit Rolling: Max assist Sidelying to sit: Max assist       General bed mobility comments: Max A and mod verbal cues for log-rolling technique, power up, and scooting forward in sitting EOB    Transfers                   General transfer comment: did not attempt transfers due to myoclonus and inability to maintain sitting without mod support EOB.     Balance Overall balance assessment: Needs assistance Sitting-balance support: No upper extremity supported Sitting balance-Leahy Scale: Poor                                     ADL either performed or assessed with clinical judgement   ADL Overall ADL's : Needs assistance/impaired     Grooming: Bed level;Wash/dry face;Maximal assistance Grooming Details (indicate cue type and reason): able to bring washcloth to face, but with lethargy and decreased attention limiting ability to wipe face.                             Functional mobility during ADLs: Maximal assistance;+2 for physical assistance;+2 for safety/equipment  Extremity/Trunk Assessment              Vision       Perception     Praxis      Cognition Arousal/Alertness: Lethargic Behavior During Therapy: Impulsive Overall Cognitive Status: No family/caregiver present to determine baseline cognitive functioning                                 General Comments: A&O to person, place, unable to state situatuon when asked and slightly  disoriented to time/date (reports it is the 12th when it is the 11th.) Follows one step commands inconsistently          Exercises     Shoulder Instructions       General Comments      Pertinent Vitals/ Pain       Pain Assessment: Faces Faces Pain Scale: Hurts even more Pain Location: back pain Pain Descriptors / Indicators: Grimacing;Moaning Pain Intervention(s): Limited activity within patient's tolerance;Monitored during session  Home Living                                          Prior Functioning/Environment              Frequency  Min 2X/week        Progress Toward Goals  OT Goals(current goals can now be found in the care plan section)  Progress towards OT goals: OT to reassess next treatment  Acute Rehab OT Goals Patient Stated Goal: to decrease pain OT Goal Formulation: With patient Time For Goal Achievement: 11/27/20 Potential to Achieve Goals: Good ADL Goals Pt Will Perform Grooming: with supervision;standing Pt Will Perform Upper Body Dressing: with set-up;sitting Pt Will Perform Lower Body Dressing: with supervision;sit to/from stand Pt Will Transfer to Toilet: with supervision;ambulating;bedside commode Pt Will Perform Toileting - Clothing Manipulation and hygiene: with supervision;sit to/from stand Pt Will Perform Tub/Shower Transfer: with min guard assist;ambulating;tub bench;rolling walker;grab bars Additional ADL Goal #1: Patient will recall 3/3 spinal precautions with no more than 1 verbal cue and good return demo during ADLs.  Plan Discharge plan needs to be updated    Co-evaluation                 AM-PAC OT "6 Clicks" Daily Activity     Outcome Measure   Help from another person eating meals?: A Lot Help from another person taking care of personal grooming?: A Lot Help from another person toileting, which includes using toliet, bedpan, or urinal?: A Lot Help from another person bathing (including washing,  rinsing, drying)?: A Lot Help from another person to put on and taking off regular upper body clothing?: A Lot Help from another person to put on and taking off regular lower body clothing?: A Lot 6 Click Score: 12    End of Session Equipment Utilized During Treatment: Rolling walker (2 wheels);Other (comment);Back brace  OT Visit Diagnosis: Unsteadiness on feet (R26.81);Repeated falls (R29.6);Muscle weakness (generalized) (M62.81);Pain;Other symptoms and signs involving cognitive function   Activity Tolerance Patient tolerated treatment well;Patient limited by pain   Patient Left in bed;with bed alarm set;with call bell/phone within reach;with nursing/sitter in room   Nurse Communication Mobility status (pt starting BM, orthostatic BP)        Time: 0737-1062 OT Time Calculation (min): 35 min  Charges: OT General  Charges $OT Visit: 1 Visit OT Treatments $Self Care/Home Management : 8-22 mins $Therapeutic Activity: 8-22 mins  Jackquline Denmark, OTS Acute Rehab Office: [phone]   Celedonio Sortino 11/15/2020, 9:53 AM

## 2020-11-15 NOTE — Progress Notes (Signed)
Referring Physician(s): Dr. Karleen Hampshire, V.   Supervising Physician: Mir, Sharen Heck  Patient Status:  [facility] - In-pt  Chief Complaint:  L2 compression fx S/p L2 kyphoplasty with Dr. Serafina Royals on 11/10   Subjective:  Patient laying bed, appears to be lethargic and uncomfortable.  States that he did not get much pain relief after kypo yesterday.  Pt appears to be SOB but he denies trouble breathing.   Allergies: Sunflower oil, Horse-derived products, Other, Tetanus toxoids, and Tetanus toxoid  Medications: Prior to Admission medications   Medication Sig Start Date End Date Taking? Authorizing Provider  ACCU-CHEK AVIVA PLUS test strip CHECK BLOOD GLUCOSE 3 TIMES DAILY Patient taking differently: 1 each by Other route 3 (three) times daily. Check blood glucose three times daily 11/26/19  Yes [name], MD  acetaminophen (TYLENOL) 500 MG tablet Take 1,000 mg by mouth every 6 (six) hours as needed for moderate pain or headache.    Yes [provider]  allopurinol (ZYLOPRIM) 300 MG tablet Take 300 mg by mouth daily.   Yes [provider]  Ascorbic Acid (VITAMIN C) 1000 MG tablet Take 1,000 mg by mouth daily.   Yes [provider]  azelastine (ASTELIN) 0.1 % nasal spray Place 2 sprays into both nostrils 2 (two) times daily as needed for rhinitis or allergies. 04/30/20  Yes [name], MD  B Complex Vitamins (VITAMIN B COMPLEX PO) Take 1 tablet by mouth at bedtime.    Yes [provider]  carvedilol (COREG) 6.25 MG tablet TAKE ONE-HALF TABLET BY  MOUTH IN THE MORNING AND 1  TABLET BY MOUTH IN THE  EVENING Patient taking differently: Take 3.125-6.25 mg by mouth 2 (two) times daily with a meal. Take 1/2 tablet by mouth in the morning, and 1 whole tablet in the evening 07/15/20  Yes Bensimhon, Shaune Pascal, MD  Cholecalciferol (VITAMIN D PO) Take 5,000 Units by mouth daily.    Yes [provider]  dapagliflozin propanediol (FARXIGA) 5 MG TABS tablet  Take 1 tablet (5 mg total) by mouth daily before breakfast. 07/04/20  Yes Bensimhon, Shaune Pascal, MD  gabapentin (NEURONTIN) 600 MG tablet Take  1/2 to 1 tablet  2 to 3 x /Daily  as needed for Pain Patient taking differently: Take 300-600 mg by mouth 3 (three) times daily as needed (pain). 11/08/20  Yes [name], MD  insulin NPH-regular Human (70-30) 100 UNIT/ML injection Inject 45 Units into the skin daily with breakfast. 25units in the evening   Yes [provider]  loratadine (CLARITIN) 10 MG tablet Take 10 mg by mouth daily as needed for allergies.    Yes [provider]  metolazone (ZAROXOLYN) 5 MG tablet Take 5 mg by mouth daily as needed (edema).   Yes [provider]  Multiple Vitamins-Minerals (PRESERVISION AREDS 2) CAPS Take 1 capsule by mouth 2 (two) times daily.    Yes [provider]  oxyCODONE-acetaminophen (PERCOCET/ROXICET) 5-325 MG tablet Take 1-2 tablets by mouth every 6 (six) hours as needed for severe pain. 11/11/20  Yes Regan Lemming, MD  polyvinyl alcohol (LIQUIFILM TEARS) 1.4 % ophthalmic solution Place 1 drop into both eyes daily as needed for dry eyes.   Yes [provider]  potassium chloride SA (KLOR-CON) 20 MEQ tablet TAKE 2 TABLETS BY MOUTH 3  TIMES DAILY FOR POTASSIUM Patient taking differently: Take 60 mEq by mouth 2 (two) times daily. 03/16/20  Yes Liane Comber, NP  senna-docusate (SENOKOT-S) 8.6-50 MG tablet Take 1 tablet  by mouth daily as needed for up to 20 doses for mild constipation. Take with roxicodone to prevent constipation 11/02/20  Yes Trifan, Carola Rhine, MD  sertraline (ZOLOFT) 100 MG tablet TAKE 1 TABLET BY MOUTH  DAILY FOR MOOD Patient taking differently: Take 100 mg by mouth daily. 03/18/20  Yes Bensimhon, Shaune Pascal, MD  torsemide (DEMADEX) 100 MG tablet Take 150 mg by mouth daily.   Yes [provider]  warfarin (COUMADIN) 5 MG tablet Take    1.5 tablets /day      or as directed Patient taking  differently: Take 7.5-10 mg by mouth See admin instructions. Take 2 tablets (87m) by mouth on Sundays and Thursdays, and 1 and 1/2 tablet(s) (7.578m on all other days. 12/11/19  Yes McUnk PintoMD  zinc gluconate 50 MG tablet Take 50 mg by mouth daily.   Yes [provider]  ALPRAZolam (XDuanne Moron1 MG tablet Take 0.5 tablets (0.5 mg total) by mouth at bedtime as needed for sleep. Patient not taking: Reported on 11/11/2020 03/23/16   McUnk PintoMD  atorvastatin (LIPITOR) 20 MG tablet Take  1 tablet  3 x /week  for Cholesterol Patient taking differently: Take 20 mg by mouth 3 (three) times a week. Take 1 tablet by mouth on Mon/ Wed/ Frid 04/21/20   McUnk PintoMD  Blood Glucose Monitoring Suppl (ACCU-CHEK AVIVA PLUS) w/Device KIT Check blood sugar 1 time  daily Patient taking differently: 1 Device by Other route as directed. 07/12/15   McUnk PintoMD  Blood Glucose Monitoring Suppl DEVI Test blood sugar up to three times a day or as directed. 05/24/17   CoLiane ComberNP  colchicine 0.6 MG tablet Take 0.6 mg by mouth every other day. Patient not taking: Reported on 11/11/2020    [provider]  Continuous Blood Gluc Receiver (DEWashburnDEVI Use to check blood sugar multiple times a day for Hypoglycemia and Hyperglycemia 08/19/20   McUnk PintoMD  Continuous Blood Gluc Sensor (DEXCOM G6 SENSOR) MISC Use to check blood sugar multiple times a day for Hypoglycemia and Hyperglycemia 08/19/20   McUnk PintoMD  Continuous Blood Gluc Transmit (DEXCOM G6 TRANSMITTER) MISC Use to check blood sugar multiple times a day for Hypoglycemia and Hyperglycemia 08/19/20   McUnk PintoMD  cyclobenzaprine (FLEXERIL) 10 MG tablet Take 1/2 to 1 tablet 3 x /day as needed for Muscle Spasm Patient not taking: Reported on 11/11/2020 10/30/20   McUnk PintoMD  dexamethasone (DECADRON) 4 MG tablet Take 1 tab 3 x day - 3 days, then 2 x day - 3 days, then 1 tab  daily Patient not taking: Reported on 11/11/2020 10/30/20   McUnk PintoMD  fluticasone (FThe [facility] At [facility] LLC50 MCG/ACT nasal spray Place 1 spray into both nostrils daily as needed for allergies. 03/14/20   McUnk PintoMD  oxyCODONE (ROXICODONE) 5 MG immediate release tablet Take 1 tablet (5 mg total) by mouth every 6 (six) hours as needed for up to 10 doses for severe pain. Patient not taking: Reported on 11/11/2020 11/02/20   TrWyvonnia DuskyMD     Vital Signs: BP 129/80 (BP Location: Left Arm)   Pulse 85   Temp 98.1 F (36.7 C) (Oral)   Resp 20   Ht _0  (1.803 m)   Wt 172 lb (78 kg)   SpO2 93%   BMI 23.99 kg/m   Physical Exam Vitals reviewed.  Constitutional:      General: He is not  in acute distress.    Comments: Lethargic, frail, uncomfortable   HENT:     Head: Normocephalic and atraumatic.  Pulmonary:     Breath sounds: Normal breath sounds.     Comments: Increased work of breathing Musculoskeletal:     Cervical back: Normal range of motion and neck supple.  Skin:    General: Skin is warm and dry.     Coloration: Skin is not jaundiced or pale.     Comments: Positive dressing on lower back puncture site. Site is unremarkable with no erythema, edema, tenderness, bleeding or drainage. No blood noted on the dressing. Dressing clean, dry, and intact.    Neurological:     Mental Status: He is alert and oriented to person, place, and time.  Psychiatric:        Mood and Affect: Mood normal.        Behavior: Behavior normal.    Imaging: CT HEAD WO CONTRAST (5MM)  Result Date: 11/11/2020 CLINICAL DATA:  Status post fall from bed last night. Posterior neck pain. Generalized weakness. EXAM: CT HEAD WITHOUT CONTRAST CT CERVICAL SPINE WITHOUT CONTRAST TECHNIQUE: Multidetector CT imaging of the head and cervical spine was performed following the standard protocol without intravenous contrast. Multiplanar CT image reconstructions of the cervical spine were also generated.  COMPARISON:  None. FINDINGS: CT HEAD FINDINGS Brain: There is no evidence for acute hemorrhage, hydrocephalus, mass lesion, or abnormal extra-axial fluid collection. No definite CT evidence for acute infarction. Diffuse loss of parenchymal volume is consistent with atrophy. Patchy low attenuation in the deep hemispheric and periventricular white matter is nonspecific, but likely reflects chronic microvascular ischemic demyelination. Vascular: No hyperdense vessel or unexpected calcification. Skull: No evidence for fracture. No worrisome lytic or sclerotic lesion. Sinuses/Orbits: Chronic mucosal disease noted in the left frontal, left ethmoid, and left maxillary sinuses, potentially related to a ostiomeatal complex disease. Mastoid air cells are clear bilaterally. Visualized portions of the globes and intraorbital fat are unremarkable. Other: None. CT CERVICAL SPINE FINDINGS Alignment: Straightening of normal cervical lordosis evident. Trace anterolisthesis of C4 on 5 likely related to the loss of facet space at the same level. Skull base and vertebrae: No acute fracture. No primary bone lesion or focal pathologic process. Soft tissues and spinal canal: No prevertebral fluid or swelling. No visible canal hematoma. Disc levels: Loss of disc height noted C5-6 and C6-7 as well as C7-T1. Upper chest: Pleural thickening noted medial left lung apex (84/4). Other: None. IMPRESSION: 1. No acute intracranial abnormality. Atrophy with chronic small vessel white matter ischemic disease. 2. 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 No evidence for cervical spine fracture. Loss of normal cervical lordosis. This can be related to patient positioning, muscle spasm or soft tissue injury. 4. Degenerative disc changes in the cervical spine. 5. Pleural thickening medial left lung apex. This should be further assessed with dedicated CT chest without contrast. Electronically Signed   By:  Misty Stanley M.D.   On: 11/11/2020 13:02   CT Cervical Spine Wo Contrast  Result Date: 11/11/2020 CLINICAL DATA:  Status post fall from bed last night. Posterior neck pain. Generalized weakness. EXAM: CT HEAD WITHOUT CONTRAST CT CERVICAL SPINE WITHOUT CONTRAST TECHNIQUE: Multidetector CT imaging of the head and cervical spine was performed following the standard protocol without intravenous contrast. Multiplanar CT image reconstructions of the cervical spine were also generated. COMPARISON:  None. FINDINGS: CT HEAD FINDINGS Brain: There is no evidence for acute hemorrhage, hydrocephalus,  mass lesion, or abnormal extra-axial fluid collection. No definite CT evidence for acute infarction. Diffuse loss of parenchymal volume is consistent with atrophy. Patchy low attenuation in the deep hemispheric and periventricular white matter is nonspecific, but likely reflects chronic microvascular ischemic demyelination. Vascular: No hyperdense vessel or unexpected calcification. Skull: No evidence for fracture. No worrisome lytic or sclerotic lesion. Sinuses/Orbits: Chronic mucosal disease noted in the left frontal, left ethmoid, and left maxillary sinuses, potentially related to a ostiomeatal complex disease. Mastoid air cells are clear bilaterally. Visualized portions of the globes and intraorbital fat are unremarkable. Other: None. CT CERVICAL SPINE FINDINGS Alignment: Straightening of normal cervical lordosis evident. Trace anterolisthesis of C4 on 5 likely related to the loss of facet space at the same level. Skull base and vertebrae: No acute fracture. No primary bone lesion or focal pathologic process. Soft tissues and spinal canal: No prevertebral fluid or swelling. No visible canal hematoma. Disc levels: Loss of disc height noted C5-6 and C6-7 as well as C7-T1. Upper chest: Pleural thickening noted medial left lung apex (84/4). Other: None. IMPRESSION: 1. No acute intracranial abnormality. Atrophy with chronic  small vessel white matter ischemic disease. 2. 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 No evidence for cervical spine fracture. Loss of normal cervical lordosis. This can be related to patient positioning, muscle spasm or soft tissue injury. 4. Degenerative disc changes in the cervical spine. 5. Pleural thickening medial left lung apex. This should be further assessed with dedicated CT chest without contrast. Electronically Signed   By: Misty Stanley M.D.   On: 11/11/2020 13:02   CT Lumbar Spine Wo Contrast  Result Date: 11/11/2020 CLINICAL DATA:  Recent L2 vertebral body fracture, fall last night EXAM: CT LUMBAR SPINE WITHOUT CONTRAST TECHNIQUE: Multidetector CT imaging of the lumbar spine was performed without intravenous contrast administration. Multiplanar CT image reconstructions were also generated. COMPARISON:  CT examination dated November 02, 2020 FINDINGS: Segmentation: 5 lumbar type vertebrae. Alignment: Grade 1 anterolisthesis of L4 Vertebrae: Interval progression of the compression deformity of L 2 vertebral body now approximately 60% vertebral body height loss. There is approximately 2 mm retropulsion into the spinal canal of the superior endplate. Diffuse osteopenia. No aggressive osseous. Paraspinal and other soft tissues: Mild soft tissue swelling tissue edema on the anterior aspect of the L2 vertebral body Disc levels: Mild multilevel degenerate disc disease with disc protrusion prominent at L4 and L5 with associated facet joint arthropathy. IMPRESSION: 1. Interval progression of compression deformity of L2 vertebral body now approximately 60% vertebral body height loss and with approximately 2 mm retropulsion into the spinal canal of the superior endplate. 2.  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 Electronically Signed   By: Keane Police D.O.   On: 11/11/2020 13:36   IR KYPHO LUMBAR INC FX REDUCE BONE BX UNI/BIL CANNULATION  INC/IMAGING  Result Date: 11/14/2020 CLINICAL DATA:  80 year old male with acute superior endplate compression fracture of L2 vertebral body. EXAM: FLUOROSCOPIC GUIDED KYPHOPLASTY OF THE L2 VERTEBRAL BODY COMPARISON:  11/11/2020, 11/02/2020 MEDICATIONS: As antibiotic prophylaxis, Ancef 2 gm IV was ordered pre-procedure and administered intravenously within 1 hour of incision. ANESTHESIA/SEDATION: Moderate (conscious) sedation was employed during this procedure. A total of Versed 1.5 mg and Fentanyl 100 mcg was administered intravenously. Moderate Sedation Time: 40 minutes. The patient's level of consciousness and vital signs were monitored continuously by radiology nursing throughout the procedure under my direct supervision. FLUOROSCOPY TIME:  Five min, 54 seconds (394 mGy)  COMPLICATIONS: None immediate. TECHNIQUE: The procedure, risks (including but not limited to bleeding, infection, organ damage), benefits, and alternatives were explained to the patient. Questions regarding the procedure were encouraged and answered. The patient understands and consents to the procedure. The patient was placed prone on the fluoroscopic table. The skin overlying the lumbar region was then prepped and draped in the usual sterile fashion. Maximal barrier sterile technique was utilized including caps, mask, sterile gowns, sterile gloves, sterile drape, hand hygiene and skin antiseptic. Intravenous Fentanyl and Versed were administered as conscious sedation during continuous cardiorespiratory monitoring by the radiology RN. The left pedicle at L2 was then infiltrated with 1% lidocaine followed by the advancement of a Kyphon trocar needle through the left pedicle into the posterior one-third of the vertebral body. Subsequently, the osteo drill was advanced to the anterior third of the vertebral body. The osteo drill was retracted. Through the working cannula, a Kyphon inflatable bone tamp 15 x 3 was advanced and positioned with the  distal marker approximately 5 mm from the anterior aspect of the cortex. Appropriate positioning was confirmed on the AP projection. At this time, the balloon was expanded using contrast via a Kyphon inflation syringe device via micro tubing. In similar fashion, the right L2 pedicle was infiltrated with 1% lidocaine followed by the advancement of a second Kyphon trocar needle through the right pedicle into the posterior third of the vertebral body. Subsequently, the osteo drill was coaxially advanced to the anterior right third. The osteo drill was exchanged for a Kyphon inflatable bone tamp 15 x 3, advanced to the 5 mm of the anterior aspect of the cortex. The balloon was then expanded using contrast as above. Inflations were continued until there was near apposition with the superior end plate. At this time, methylmethacrylate mixture was reconstituted in the Kyphon bone mixing device system. This was then loaded into the delivery mechanism, attached to Kyphon bone fillers. The balloons were deflated and removed followed by the instillation of methylmethacrylate mixture with excellent filling in the AP and lateral projections. No extravasation was noted in the disk spaces or posteriorly into the spinal canal. No epidural venous contamination was seen. The working cannulae and the bone filler were then retrieved and removed. Hemostasis was achieved with manual compression. The patient tolerated the procedure well without immediate postprocedural complication. IMPRESSION: 1. Technically successful L2 vertebral body augmentation using balloon kyphoplasty. 2. Per CMS PQRS reporting requirements (PQRS Measure 24): Given the patient's age of greater than 53 and the fracture site (hip, distal radius, or spine), the patient should be tested for osteoporosis using DXA, and the appropriate treatment considered based on the DXA results. Ruthann Cancer, MD [facility]  Electronically Signed   By: Ruthann Cancer M.D.   On: 11/14/2020 15:16   DG Hip Unilat W or Wo Pelvis 2-3 Views Left  Result Date: 11/11/2020 CLINICAL DATA:  Status post fall. EXAM: DG HIP (WITH OR WITHOUT PELVIS) 2-3V LEFT COMPARISON:  None. FINDINGS: There is no evidence of hip fracture or dislocation. 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 IMPRESSION: 1. No acute findings. 2. Mild bilateral and symmetric osteoarthritis. Electronically Signed   By: Kerby Moors M.D.   On: 11/11/2020 12:53    Labs:  CBC: Recent Labs    11/11/20 1414 11/12/20 0309 11/13/20 0321 11/14/20 0308  WBC 16.2* 10.9* 12.2* 12.3*  HGB 13.3 12.5* 12.3* 13.1  HCT 41.2 39.6 39.6 42.0  PLT 187 175  198 195    COAGS: Recent Labs    11/12/20 1736 11/13/20 0321 11/14/20 0308 11/15/20 0524  INR 2.3* 1.7* 1.5* 1.5*    BMP: Recent Labs    04/30/20 1558 06/06/20 1536 06/09/20 1601 06/18/20 1035 07/01/20 1605 08/06/20 1553 11/12/20 0309 11/13/20 0321 11/14/20 0308 11/15/20 0524  NA 141 142   < > 140 140   < > 136 134* 135 135  K 4.2 3.9   < > 4.3 4.8   < > 4.6 4.7 4.7 4.4  CL 102 105   < > 97* 104   < > 108 105 106 104  CO2 31 26   < > 30 28   < > 17* 22 20* 19*  GLUCOSE 182* 99   < > 361* 108*   < > 111* 99 161* 165*  BUN 33* 50*   < > 63* 30*   < > 38* 35* 39* 42*  CALCIUM 9.0 8.6   < > 8.8 8.6   < > 8.3* 8.5* 8.5* 8.8*  CREATININE 1.61* 1.89*   < > 2.24* 1.96*   < > 1.34* 1.51* 1.38* 1.50*  GFRNONAA 40* 33*   < > 27* 31*   < > 53* 46* 51* 46*  GFRAA 46* 38*  --  31* 36*  --   --   --   --   --    < > = values in this interval not displayed.    LIVER FUNCTION TESTS: Recent Labs    06/09/20 1601 07/01/20 1605 10/02/20 1005 10/23/20 1128 11/07/20 0346 11/12/20 0309  BILITOT 0.4   < > 0.6 0.9 1.1 1.3*  AST 16   < > _0 ALT 15   < > 32 _1 ALKPHOS 111  --  186*  --  159* 195*  PROT 6.3*   < > 7.0 6.8 6.2* 6.2*  ALBUMIN 3.9  --  3.9  --  3.4* 3.4*    < > = values in this interval not displayed.    Assessment and Plan:  80 y.o. male with acute, symptomatic  L2 compression fx; s/p L2 kypho with IR on 11/10.  Patient states that he is still in moderate-sever pain, did not receive much pain relief after kypo yesterday. Lower back puncture site unremarkable.  Informed the patient that the pain should improve gradually, and he will reach the maximum benefit in 2 weeks.  Also informed that patient that follow up will be as needed basis, encouraged the patient to contact [facility] IR for questions and concerns.   Recommendation:  - No bending, stopping, lifting more than 10 pounds for 2 weeks - Prevent twisting/rotating lower back, no driving x 2 weeks if possible (to minimize rotation of the lower back)  - Ambulate with a walker for at least 2 weeks to prevent fall - Keep the lower back puncture site site clean and dry. No submerging (bathing or swimming) for 7 days.    Further treatment plan per [facility] Please call IR for questions and concerns.     Electronically Signed: Tera Mater, PA-C 11/15/2020, 8:01 AM   I spent a total of 25 Minutes at the the patient's bedside AND on the patient's hospital floor or unit, greater than 50% of which was counseling/coordinating care for L2 kypoplasty.   This chart was dictated using voice recognition software.  Despite best efforts to proofread,  errors can occur which can change the documentation  meaning.

## 2020-11-15 NOTE — Progress Notes (Signed)
Physical Therapy Treatment Patient Details Name: [name]. MRN: [id number] DOB: 09-17-1940 Today's Date: 11/15/2020   History of Present Illness Pt is 80 yo male admitted on 11/11/20 with worsening of L2 compression deformity with ~60% height loss and 2 mm retropulsion.  Kyphoplasty L2 on 11/14/20.  Pt with hx of a flutter s/p DCCV, CAD, adrenal adenoma, CKD, diastolic heart failure, DM2, depression, GERD, HTN, falls, sleep apnea.    PT Comments    Pt is now s/p kyphoplasty POD #1 - goals and POC remain appropriate.  Pt was very limited today due to lethargy and symptomatic orthostatic hypotension with myoclonic jerking, decreased alertness, and decreased ability to follow commands.  He required mod - max A with assist of 2 at times and not able to get OOB safely due to BP.  Will f/u at later date.  RN alerted.     Recommendations for follow up therapy are one component of a multi-disciplinary discharge planning process, led by the attending physician.  Recommendations may be updated based on patient status, additional functional criteria and insurance authorization.  Follow Up Recommendations  Skilled nursing-short term rehab (<3 hours/day)     Assistance Recommended at Discharge Frequent or constant Supervision/Assistance  Equipment Recommendations  Rolling walker (2 wheels)    Recommendations for Other Services       Precautions / Restrictions Precautions Precautions: Fall;Back Precaution Booklet Issued: No Precaution Comments: pt too lethargic at this time to participate in education Required Braces or Orthoses: Spinal Brace Spinal Brace: Thoracolumbosacral orthotic;Applied in sitting position Restrictions Weight Bearing Restrictions: No     Mobility  Bed Mobility Overal bed mobility: Needs Assistance Bed Mobility: Rolling;Sidelying to Sit;Sit to Sidelying Rolling: Max assist Sidelying to sit: Mod assist     Sit to sidelying: Max assist;+2 for  safety/equipment General bed mobility comments: Multimodal/tactile cues for log rolling technique with mod A to lift trunk.  Required max A of 2 back to bed due to orthostatic hypotension    Transfers Overall transfer level: Needs assistance Equipment used: Rolling walker (2 wheels)               General transfer comment: Did not attempt transfers due to myoclonus jerking, orthostatic, and inability to maintain sitting without mod-max support EOB.    Ambulation/Gait                   Stairs             Wheelchair Mobility    Modified Rankin (Stroke Patients Only)       Balance Overall balance assessment: Needs assistance Sitting-balance support: Bilateral upper extremity supported Sitting balance-Leahy Scale: Zero Sitting balance - Comments: Sat EOB for 5 mins but pt with myoclonus jerking tending to go forward and R. Requiring mod-max A.                                    Cognition Arousal/Alertness: Lethargic Behavior During Therapy: Flat affect Overall Cognitive Status: No family/caregiver present to determine baseline cognitive functioning                                 General Comments: A&O to person, place, unable to state situatuon when asked.  Pt extremely lethargic and difficulty staying awake/following commands/partcipating today        Exercises  General Comments General comments (skin integrity, edema, etc.): Had received reports earlier that pt lethargic, orthostatic, and confused so waited to at least midday to see pt.  Still confused and lethargic.   BP in supine130/76, sitting 107/85, sitting 3-5 mins with attempts to raise BP including AAROM legs, arms, coughing, isometric chest press motion but still 99/61 and pt lethargic, decreased responsiveness, myclonic jerking , return to supine 120/82      Pertinent Vitals/Pain Pain Assessment: Faces Faces Pain Scale: Hurts a little bit Pain Location:  back pain Pain Descriptors / Indicators: Grimacing Pain Intervention(s): Limited activity within patient's tolerance;Monitored during session    Home Living                          Prior Function            PT Goals (current goals can now be found in the care plan section) Progress towards PT goals: Not progressing toward goals - comment (limited due to medical complications - orthostatic)    Frequency    Min 3X/week      PT Plan Current plan remains appropriate    Co-evaluation              AM-PAC PT "6 Clicks" Mobility   Outcome Measure  Help needed turning from your back to your side while in a flat bed without using bedrails?: Total Help needed moving from lying on your back to sitting on the side of a flat bed without using bedrails?: Total Help needed moving to and from a bed to a chair (including a wheelchair)?: Total Help needed standing up from a chair using your arms (e.g., wheelchair or bedside chair)?: Total Help needed to walk in hospital room?: Total Help needed climbing 3-5 steps with a railing? : Total 6 Click Score: 6    End of Session   Activity Tolerance: Treatment limited secondary to medical complications (Comment) Patient left: with call bell/phone within reach;with bed alarm set;in bed (HOB 30 degrees) Nurse Communication: Mobility status PT Visit Diagnosis: Unsteadiness on feet (R26.81);Other abnormalities of gait and mobility (R26.89);Muscle weakness (generalized) (M62.81)     Time: 7017-7939 PT Time Calculation (min) (ACUTE ONLY): 22 min  Charges:  $Therapeutic Activity: 8-22 mins                     Abran Richard, PT Acute Rehab Services Pager [phone] [facility] [place] 11/15/2020, 12:46 PM

## 2020-11-15 NOTE — NC FL2 (Signed)
[place] LEVEL OF CARE SCREENING TOOL     IDENTIFICATION  Patient Name: [name]. Birthdate: 1940-12-05 Sex: male Admission Date (Current Location): 11/11/2020  [facility] and [place] Number:  Herbalist and Address:  [facility],  [address], [place]      Provider Number: [id number]  Attending Physician Name and Address:  [place] Eric J, DO  Relative Name and Phone Number:  wife, Paymon Rosensteel [phone]    Current Level of Care: Hospital Recommended Level of Care: [place] Prior Approval Number:    Date Approved/Denied:   PASRR Number: [id number]  Discharge Plan: SNF    Current Diagnoses: Patient Active Problem List   Diagnosis Date Noted   Closed compression fracture of L2 lumbar vertebra, initial encounter ([place]) 11/11/2020   Angiodysplasia of gastrointestinal tract    Gastritis    Prolonged QT interval 06/10/2020   Blood loss anemia    GI bleeding 06/09/2020   Atrial fibrillation, chronic ([place]) 04/29/2020   Secondary hyperparathyroidism, renal ([place]) 03/22/2018   Iron deficiency anemia    Senile purpura ([place]) 11/11/2016   CKD stage 3 due to type 2 diabetes mellitus ([place]) 01/11/2015   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   Chronic restrictive lung disease 09/03/2014   Pulmonary hypertension ([place]) 08/23/2014   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[place]) 01/30/2014   OSA on CPAP 01/30/2014   PFO (patent foramen ovale) 10/30/2013   CAD (coronary artery disease) 10/29/2013   Essential hypertension 10/25/2013   Chronic anticoagulation (INR goal 2.0-2.5) 08/22/2013   Atrial flutter ([place]) 08/21/2013   Vitamin D deficiency 05/19/2013   Type 2 diabetes mellitus with stage 4 chronic kidney disease, with long-term current use of insulin (HCC)    GERD    DJD (degenerative joint disease)    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 04/05/2013   S/P Maze operation for  atrial fibrillation 04/05/2013   Chronic diastolic congestive heart failure ([place])    Hypertensive cardiomyopathy ([place])    Hyperlipidemia associated with type 2 diabetes mellitus ([place]) 09/03/2008   PAF (paroxysmal atrial fibrillation) ([place]) 09/03/2008   Diverticulosis of large intestine 09/03/2008   Colonic Polyps 09/03/2008    Orientation RESPIRATION BLADDER Height & Weight     Self, Time, Situation, Place  Normal Continent, External catheter Weight: 172 lb (78 kg) Height:  5\' 11"  (180.3 cm)  BEHAVIORAL SYMPTOMS/MOOD NEUROLOGICAL BOWEL NUTRITION STATUS      Continent    AMBULATORY STATUS COMMUNICATION OF NEEDS Skin   Extensive Assist Verbally Normal                       Personal Care Assistance Level of Assistance  Bathing, Dressing Bathing Assistance: Limited assistance   Dressing Assistance: Limited assistance     Functional Limitations Info             [place]  PT (By licensed PT), OT (By licensed OT)     PT Frequency: 5x/wk OT Frequency: 5x/wk            Contractures Contractures Info: Not present    Additional Factors Info  Code Status, Allergies, Psychotropic Code Status Info: Full Allergies Info: Sunflower Oil, Horse-derived Products, Other, Tetanus Toxoids, Tetanus Toxoid Psychotropic Info: see MAR         Current Medications (11/15/2020):  This is the current hospital active medication list Current Facility-Administered Medications  Medication Dose Route Frequency Provider Last Rate Last Admin   acetaminophen (TYLENOL) tablet 1,000 mg  1,000 mg Oral Q6H PRN Hosie Poisson, MD   1,000 mg at 11/14/20 2105   allopurinol (ZYLOPRIM) tablet 300 mg  300 mg Oral Daily Hosie Poisson, MD   300 mg at 11/15/20 0940   ALPRAZolam (XANAX) tablet 0.5 mg  0.5 mg Oral QHS PRN Hosie Poisson, MD   0.5 mg at 11/13/20 2231   carvedilol (COREG) tablet 1.5625 mg  1.5625 mg Oral BID WC Hosie Poisson, MD   1.5625 mg at 11/15/20 0854   [START ON  11/16/2020] dapagliflozin propanediol (FARXIGA) tablet 5 mg  5 mg Oral QAC breakfast Wofford, Drew A, RPH       gabapentin (NEURONTIN) capsule 300 mg  300 mg Oral BID Hosie Poisson, MD   300 mg at 11/15/20 0940   insulin aspart (novoLOG) injection 0-9 Units  0-9 Units Subcutaneous TID WC Hosie Poisson, MD   1 Units at 11/15/20 1142   insulin glargine-yfgn (SEMGLEE) injection 5 Units  5 Units Subcutaneous Daily [place] Eric J, DO   5 Units at 11/15/20 1142   morphine 2 MG/ML injection 1-2 mg  1-2 mg Intravenous Q4H PRN Hosie Poisson, MD   2 mg at 11/12/20 0602   oxyCODONE-acetaminophen (PERCOCET/ROXICET) 5-325 MG per tablet 1-2 tablet  1-2 tablet Oral Q4H PRN Hosie Poisson, MD   2 tablet at 11/15/20 0447   sertraline (ZOLOFT) tablet 100 mg  100 mg Oral QHS Hosie Poisson, MD   100 mg at 11/14/20 2106   warfarin (COUMADIN) tablet 7.5 mg  7.5 mg Oral ONCE-1600 Wofford, Cindie [place], [facility]       Warfarin - Pharmacist Dosing Inpatient   Does not apply q1600 Emiliano Dyer, [facility]         Discharge Medications: Please see discharge summary for a list of discharge medications.  Relevant Imaging Results:  Relevant Lab Results:   Additional Information SS# [id number]  Lennart Pall, LCSW

## 2020-11-15 NOTE — Progress Notes (Addendum)
[place] for warfarin Indication: atrial fibrillation   Allergies  Allergen Reactions   Sunflower Oil Swelling and Other (See Comments)    Tongue and lip swelling Other reaction(s): Other Tongue and lip swelling   Horse-Derived Products Other (See Comments)    Per allergy skin test UNSPECIFIED REACTION  Other reaction(s): Other Per allergy skin test UNSPECIFIED REACTION    Other Other (See Comments)    Tetanus Shot -- skin test reaction   Tetanus Toxoids Other (See Comments)    Per allergy skin test   Tetanus Toxoid     Other reaction(s): Other (See Comments) Rash(horse serum) Other reaction(s): Other Rash(horse serum)    Patient Measurements: Height: 5\' 11"  (180.3 cm) Weight: 78 kg (172 lb) IBW/kg (Calculated) : 75.3   Vital Signs: Temp: 98.1 F (36.7 C) (11/11 0329) Temp Source: Oral (11/11 0329) BP: 129/80 (11/11 0329) Pulse Rate: 85 (11/11 0329)  Labs: Recent Labs    11/13/20 0321 11/14/20 0308 11/15/20 0524  HGB 12.3* 13.1  --   HCT 39.6 42.0  --   PLT 198 195  --   LABPROT 20.3* 18.0* 17.7*  INR 1.7* 1.5* 1.5*  CREATININE 1.51* 1.38* 1.50*     Estimated Creatinine Clearance: 41.1 mL/min (A) (by C-G formula based on SCr of 1.5 mg/dL (H)).  Medications:  PTA Warfarin  Assessment: 72 yoM admitted with compression fracture of the L2 lumbar vertebra s/p fall on 11/02/20. Bridged with enoxaparin for kyphoplasty which occurred on 11/10; now bridging back to warfarin per Rx dosing. PTA warfarin 7.5mg  daily except 10mg  on Sundays/Thursdays; LD 11/6 INR elevated on admission  Today, 11/15/2020: CBC WNL yesterday pre-procedure; not checked today INR remains subtherapeutic as expected after Vit K and holding warfarin Meal intake poor/not charted No major drug-drug interactions with warfarin identified No bridging needed per [facility]  Goal of Therapy:  INR 2-3 Monitor platelets by anticoagulation protocol: Yes    Plan:  Warfarin 7.5 mg PO x1 tonight Daily INR; CBC as needed Monitor for signs of bleeding or thrombosis   Rynell Ciotti A, PharmD 11/15/2020,8:29 AM

## 2020-11-15 NOTE — TOC Progression Note (Signed)
Transition of Care (TOC) - Progression Note    Patient Details  Name: [name]. MRN: [id number] Date of Birth: [birth year]  Transition of Care [facility]) CM/SW Contact  Lennart Pall, LCSW Phone Number: 11/15/2020, 1:19 PM  Clinical Narrative:     Met with pt today and confirm he is an agreement with recommendation for SNF rehab.  He is admittedly frustrated with his overall situation and hopeful that rehab will be short term.  Have completed and sent out the FL2.  Await bed offers/ pt bed choice and then insurance authorization.  Expected Discharge Plan: [place] (vs. SNF) Barriers to Discharge: Continued Medical Work up  Expected Discharge Plan and Services Expected Discharge Plan: [place] (vs. SNF)       Living arrangements for the past 2 months: Single Family Home                                       Social Determinants of Health (SDOH) Interventions    Readmission Risk Interventions No flowsheet data found.

## 2020-11-16 LAB — GLUCOSE, CAPILLARY
Glucose-Capillary: 168 mg/dL — ABNORMAL HIGH (ref 70–99)
Glucose-Capillary: 193 mg/dL — ABNORMAL HIGH (ref 70–99)
Glucose-Capillary: 124 mg/dL — ABNORMAL HIGH (ref 70–99)
Glucose-Capillary: 135 mg/dL — ABNORMAL HIGH (ref 70–99)

## 2020-11-16 LAB — CBC
HCT: 36.5 % — ABNORMAL LOW (ref 39.0–52.0)
Hemoglobin: 11.7 g/dL — ABNORMAL LOW (ref 13.0–17.0)
MCH: 31.2 pg (ref 26.0–34.0)
MCHC: 32.1 g/dL (ref 30.0–36.0)
MCV: 97.3 fL (ref 80.0–100.0)
Platelets: 192 10*3/uL (ref 150–400)
RBC: 3.75 MIL/uL — ABNORMAL LOW (ref 4.22–5.81)
RDW: 17.3 % — ABNORMAL HIGH (ref 11.5–15.5)
WBC: 13 10*3/uL — ABNORMAL HIGH (ref 4.0–10.5)
nRBC: 0 % (ref 0.0–0.2)

## 2020-11-16 LAB — PROTIME-INR
INR: 1.9 — ABNORMAL HIGH (ref 0.8–1.2)
Prothrombin Time: 21.5 seconds — ABNORMAL HIGH (ref 11.4–15.2)

## 2020-11-16 MED ORDER — WARFARIN SODIUM 5 MG PO TABS
5.0000 mg | ORAL_TABLET | Freq: Once | ORAL | Status: AC
  Administered 2020-11-16: 5 mg via ORAL
  Filled 2020-11-16: qty 1

## 2020-11-16 MED ORDER — TORSEMIDE 20 MG PO TABS
150.0000 mg | ORAL_TABLET | Freq: Every day | ORAL | Status: DC
  Administered 2020-11-16 – 2020-11-19 (×4): 150 mg via ORAL
  Filled 2020-11-16 (×4): qty 1

## 2020-11-16 NOTE — TOC Progression Note (Signed)
Transition of Care (TOC) - Progression Note    Patient Details  Name: [name]. MRN: [id number] Date of Birth: 10/28/1940  Transition of Care [facility]) CM/SW Contact  Pearlee Arvizu, [place], [place] Phone Number: 11/16/2020, 12:46 PM  Clinical Narrative:    Patient has been faxed out awaiting bed offers, and patient will need insurance authorization once being close to medically ready for discharge.   Expected Discharge Plan: [place] (vs. SNF) Barriers to Discharge: Continued Medical Work up  Expected Discharge Plan and Services Expected Discharge Plan: [place] (vs. SNF)       Living arrangements for the past 2 months: Single Family Home                                       Social Determinants of Health (SDOH) Interventions    Readmission Risk Interventions No flowsheet data found.

## 2020-11-16 NOTE — Plan of Care (Signed)
  Problem: Clinical Measurements: Goal: Ability to maintain clinical measurements within normal limits will improve Outcome: Progressing   Problem: Activity: Goal: Risk for activity intolerance will decrease Outcome: Progressing   Problem: Pain Managment: Goal: General experience of comfort will improve Outcome: Progressing   

## 2020-11-16 NOTE — Progress Notes (Signed)
, PROGRESS NOTE    [name]  ZRA:[id number] DOB: 1940-05-23 DOA: 11/11/2020 PCP: [name], MD    Brief Narrative:  Maximus Hoffert. is a 80 year old male with past medical history significant for atrial flutter s/p DCCV, CAD, adrenal adenoma, CKD stage IIIa, chronic diastolic congestive heart failure, type 2 diabetes mellitus, depression, GERD, essential hypertension, OSA, multiple falls with recent L2 compression fracture treated with TLSO brace and pain medication who presents to [facility] ED on 11/7 with continued low back pain following recurrent fall.  In the ED, afebrile, BP 160/107, creatinine 1.52, WBC 16.2, INR 3.4, COVID-19 PCR/influenza A/B PCR negative.  CT head/C-spine with no acute intracranial abnormality and no cervical fracture/listhesis.  CT L-spine showed interval progression of compression deformity L2 with vertebral body now approximately 6% height loss with approximately 2 mm retropulsion into spinal canal of the superior endplate.  Hip x-rays with no acute findings.  IR was consulted for kyphoplasty.  Duration consulted for further evaluation and management of acute pain secondary to recurrent falls with lumbar compression fracture.   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  CT L-spine notable for interval progression of L2 compression fracture now with 50% height loss with 2 mm retropulsion into the spinal canal of the superior endplate.  Patient without bowel or bladder incontinence and no weakness.  Patient underwent kyphoplasty L2 segment by interventional radiology, Dr. Serafina Royals on 11/14/2020. --Continue TLSO brace --Oxycodone-acetaminophen 5-325 mg, 1-2 tablets q4h PRN moderate pain --Morphine 1-2 mg IV every 4 hours severe pain --PT/OT currently recommending SNF placement, TOC for management  Essential hypertension BP 148/96 this morning --Carvedilol reduced to 1.5625 mg twice daily --Continue to monitor BP closely  Paroxysmal atrial fibrillation s/P MAZE procedure Hx atrial flutter s/p DCCV On anticoagulation with Coumadin outpatient. --Carvedilol reduced to 1.5625 mg twice daily --Coumadin, pharmacy consulted for dosing/monitoring; INR 1.9 today (goal 2-3) --Discontinue telemetry today  Chronic diastolic congestive heart failure, compensated Home medications include metolazone 5 mg p.o. daily as needed, carvedilol 3.125 mg p.o. twice daily, torsemide 100 mg p.o. daily. --Continue carvedilol at reduced dose 1.5625gm BID --Holding home metolazone/torsemide --Strict I's and O's and daily weights  Supratherapeutic INR On Coumadin outpatient for A. fib/flutter.  INR 3.4 on admission, s/p 1 dose of vitamin K IV. --INR 3.4>1.7>1.5>1.5  --Resume Coumadin today --INR daily  CKD stage IIIa --Cr 1.52>1.34>1.51>1.38>1.50, stable (baseline 1.6-2.2) --Avoid nephrotoxins, renal dose all medications  Hyperlipidemia On atorvastatin 20 mg 3 x weekly on Monday/Wednesday/Friday. --Hold statin for now  Type 2 diabetes mellitus Home regimen includes NPH 70/30 45 units subcutaneously daily with breakfast and Farxiga 5 mg p.o. daily.  Hemoglobin A1c 6.3, well controlled. --Farxiga 5 mg p.o. daily --Holding home NPH --Start  Semglee 5u [place]  daily --Sensitive SSI for coverage --CBGs qAC/HS  Diabetic neuropathy: Gabapentin 300 mg p.o. twice daily  Hx gout: Allopurinol 300 mg p.o. daily  Anxiety/depression: --Zoloft 1 mg p.o. daily --Xanax 0.5 mg p.o. nightly as needed  DVT prophylaxis:     Code Status: Full Code Family Communication: No family present at bedside this morning, updated patient's spouse, Reta via telephone yesterday  Disposition Plan:  Level of care: Med-Surg Status is: Inpatient  Remains inpatient appropriate because: Pending SNF placement   Consultants:  Interventional radiology  Procedures:  L2 kyphoplasty, interventional radiology, Dr. Serafina Royals; 11/14/20  Antimicrobials:  None   Subjective: Patient seen examined bedside, resting comfortably.  Continues with mild lower back pain.  No family present at bedside this morning.  Updated spouse yesterday afternoon.  No other questions or concerns at this time.  Pending SNF placement. Denies headache, no fever/chills/night sweats, no nausea/vomiting/diarrhea, no chest pain, no palpitations, no abdominal pain, no cough/congestion.  No acute events overnight per nurse staff.  Objective: Vitals:   11/15/20 0329 11/15/20 1140 11/15/20 2047 11/16/20 0514  BP: 129/80 124/75 (!) 159/94 (!) 148/96  Pulse: 85 88 83 86  Resp: 20 16 17 17   Temp: 98.1 F (36.7 C) 98.8 F (37.1 C) 97.6 F (36.4 C) 98 F (36.7 C)  TempSrc: Oral Oral    SpO2: 93% 91% 97% 98%  Weight:      Height:        Intake/Output Summary (Last 24 hours) at 11/16/2020 1024 Last data filed at 11/16/2020 0600 Gross per 24 hour  Intake 1240 ml  Output 400 ml  Net 840 ml   Filed Weights   11/11/20 1157  Weight: 78 kg    Examination:  General exam: Appears calm and comfortable  Respiratory system: Clear to auscultation. Respiratory effort normal.  On 2LNC Cardiovascular system: S1 & S2 heard, RRR. No JVD, murmurs, rubs, gallops or clicks. No pedal edema. Gastrointestinal  system: Abdomen is nondistended, soft and nontender. No organomegaly or masses felt. Normal bowel sounds heard. Central nervous system: Alert and oriented. No focal neurological deficits. Extremities: Symmetric 5 x 5 power. Skin: No rashes, lesions or ulcers Psychiatry: Judgement and insight appear normal. Mood & affect appropriate.     Data Reviewed: I have personally reviewed following labs and imaging studies  CBC: Recent Labs  Lab 11/11/20 1414 11/12/20 0309 11/13/20 0321 11/14/20 0308 11/16/20 0737  WBC 16.2* 10.9* 12.2* 12.3* 13.0*  NEUTROABS 14.2*  --   --   --   --   HGB 13.3 12.5* 12.3* 13.1 11.7*  HCT 41.2 39.6 39.6 42.0 36.5*  MCV 96.5 97.1 97.8 98.4 97.3  PLT 187 175 198 195 240   Basic Metabolic Panel: Recent Labs  Lab 11/11/20 1414 11/12/20 0309 11/13/20 0321 11/14/20 0308 11/15/20 0524  NA 137 136 134* 135 135  K 4.8 4.6 4.7 4.7 4.4  CL 107 108 105 106 104  CO2 23 17* 22 20* 19*  GLUCOSE 71 111* 99 161* 165*  BUN 41* 38* 35* 39* 42*  CREATININE 1.52* 1.34* 1.51* 1.38* 1.50*  CALCIUM 8.7* 8.3* 8.5* 8.5* 8.8*   GFR: Estimated Creatinine Clearance: 41.1 mL/min (A) (by C-G formula based on SCr of 1.5 mg/dL (H)). Liver Function Tests: Recent Labs  Lab 11/12/20 0309  AST 19  ALT 27  ALKPHOS 195*  BILITOT 1.3*  PROT 6.2*  ALBUMIN 3.4*   No results for input(s): LIPASE, AMYLASE in the last 168 hours.  No results for input(s): AMMONIA in the last 168 hours. Coagulation Profile: Recent Labs  Lab 11/12/20 1736 11/13/20 0321 11/14/20 0308 11/15/20 0524 11/16/20 0737  INR 2.3* 1.7* 1.5* 1.5* 1.9*   Cardiac Enzymes: No results for input(s): CKTOTAL, CKMB, CKMBINDEX, TROPONINI in the last 168 hours. BNP (last 3 results) No results for input(s): PROBNP in the last 8760 hours. HbA1C: No results for input(s): HGBA1C in the last 72 hours. CBG: Recent Labs  Lab 11/15/20 0825 11/15/20 1134 11/15/20 1650 11/15/20 2047 11/16/20 0814  GLUCAP 207*  141* 130* 113* 168*   Lipid Profile: No results for input(s): CHOL, HDL, LDLCALC, TRIG, CHOLHDL, LDLDIRECT in the last 72 hours. Thyroid Function Tests: No results for input(s): TSH, T4TOTAL, FREET4, T3FREE, THYROIDAB in the last 72 hours. Anemia Panel: No results for input(s): VITAMINB12, FOLATE, FERRITIN, TIBC, IRON, RETICCTPCT in the last 72 hours. Sepsis Labs: No results for input(s): PROCALCITON, LATICACIDVEN in the last 168 hours.  Recent Results (from the past 240 hour(s))  Resp Panel by RT-PCR (Flu A&B, Covid) Nasopharyngeal Swab     Status: None   Collection Time: 11/11/20  5:50 PM   Specimen: Nasopharyngeal Swab; Nasopharyngeal(NP) swabs in vial transport medium  Result Value Ref Range Status   SARS Coronavirus 2 by RT PCR NEGATIVE NEGATIVE Final    Comment: (NOTE) SARS-CoV-2 target nucleic acids are NOT DETECTED.  The SARS-CoV-2 RNA is generally detectable in upper respiratory specimens during the acute phase of infection. The lowest concentration of SARS-CoV-2 viral copies this assay can detect is 138 copies/mL. A negative result does not preclude SARS-Cov-2 infection and should not be used as the sole basis for treatment or other patient management decisions. A negative result may occur with  improper specimen collection/handling, submission of specimen other than nasopharyngeal swab, presence of viral mutation(s) within the areas targeted by this assay, and inadequate number of viral copies(<138 copies/mL). A negative result must be combined with clinical observations, patient history, and epidemiological information. The expected result is Negative.  Fact Sheet for Patients:  EntrepreneurPulse.com.au  Fact Sheet for Healthcare Providers:  IncredibleEmployment.be  This test is no t yet approved or cleared by the [place] FDA and  has been authorized for detection and/or diagnosis of SARS-CoV-2 by FDA under an Emergency Use  Authorization (EUA). This EUA will remain  in effect (meaning this test can be used) for the duration of the COVID-19 declaration under Section 564(b)(1) of the Act, 21 U.S.C.section 360bbb-3(b)(1), unless the authorization is terminated  or revoked sooner.       Influenza A by PCR NEGATIVE NEGATIVE Final   Influenza B by PCR NEGATIVE NEGATIVE Final    Comment: (NOTE) The Xpert Xpress SARS-CoV-2/FLU/RSV plus assay is intended as an aid in the diagnosis of influenza from Nasopharyngeal swab specimens and should not be used as a sole basis for treatment. Nasal washings and aspirates are unacceptable for Xpert Xpress SARS-CoV-2/FLU/RSV testing.  Fact Sheet for Patients: EntrepreneurPulse.com.au  Fact Sheet for Healthcare Providers: IncredibleEmployment.be  This test is not yet approved or cleared by the [place] FDA and has been authorized for detection and/or diagnosis of SARS-CoV-2 by FDA under an Emergency Use Authorization (EUA). This EUA will remain in effect (meaning this test can be used) for the duration of the COVID-19 declaration under Section 564(b)(1) of the Act, 21 U.S.C. section 360bbb-3(b)(1), unless the authorization is terminated or revoked.  Performed at [facility] [place] [address], [place], [place] [address]  Radiology Studies: IR KYPHO LUMBAR INC FX REDUCE BONE BX UNI/BIL CANNULATION INC/IMAGING  Result Date: 11/14/2020 CLINICAL DATA:  80 year old male with acute superior endplate compression fracture of L2 vertebral body. EXAM: FLUOROSCOPIC GUIDED KYPHOPLASTY OF THE L2 VERTEBRAL BODY COMPARISON:  11/11/2020, 11/02/2020 MEDICATIONS: As antibiotic prophylaxis, Ancef 2 gm IV was ordered pre-procedure and administered intravenously within 1 hour of incision. ANESTHESIA/SEDATION: Moderate (conscious) sedation was employed during this procedure. A total of Versed 1.5 mg and Fentanyl 100 mcg was  administered intravenously. Moderate Sedation Time: 40 minutes. The patient's level of consciousness and vital signs were monitored continuously by radiology nursing throughout the procedure under my direct supervision. FLUOROSCOPY TIME:  Five min, 54 seconds (893 mGy) COMPLICATIONS: None immediate. TECHNIQUE: The procedure, risks (including but not limited to bleeding, infection, organ damage), benefits, and alternatives were explained to the patient. Questions regarding the procedure were encouraged and answered. The patient understands and consents to the procedure. The patient was placed prone on the fluoroscopic table. The skin overlying the lumbar region was then prepped and draped in the usual sterile fashion. Maximal barrier sterile technique was utilized including caps, mask, sterile gowns, sterile gloves, sterile drape, hand hygiene and skin antiseptic. Intravenous Fentanyl and Versed were administered as conscious sedation during continuous cardiorespiratory monitoring by the radiology RN. The left pedicle at L2 was then infiltrated with 1% lidocaine followed by the advancement of a Kyphon trocar needle through the left pedicle into the posterior one-third of the vertebral body. Subsequently, the osteo drill was advanced to the anterior third of the vertebral body. The osteo drill was retracted. Through the working cannula, a Kyphon inflatable bone tamp 15 x 3 was advanced and positioned with the distal marker approximately 5 mm from the anterior aspect of the cortex. Appropriate positioning was confirmed on the AP projection. At this time, the balloon was expanded using contrast via a Kyphon inflation syringe device via micro tubing. In similar fashion, the right L2 pedicle was infiltrated with 1% lidocaine followed by the advancement of a second Kyphon trocar needle through the right pedicle into the posterior third of the vertebral body. Subsequently, the osteo drill was coaxially advanced to the  anterior right third. The osteo drill was exchanged for a Kyphon inflatable bone tamp 15 x 3, advanced to the 5 mm of the anterior aspect of the cortex. The balloon was then expanded using contrast as above. Inflations were continued until there was near apposition with the superior end plate. At this time, methylmethacrylate mixture was reconstituted in the Kyphon bone mixing device system. This was then loaded into the delivery mechanism, attached to Kyphon bone fillers. The balloons were deflated and removed followed by the instillation of methylmethacrylate mixture with excellent filling in the AP and lateral projections. No extravasation was noted in the disk spaces or posteriorly into the spinal canal. No epidural venous contamination was seen. The working cannulae and the bone filler were then retrieved and removed. Hemostasis was achieved with manual compression. The patient tolerated the procedure well without immediate postprocedural complication. IMPRESSION: 1. Technically successful L2 vertebral body augmentation using balloon kyphoplasty. 2. Per CMS PQRS reporting requirements (PQRS Measure 24): Given the patient's age of greater than 5 and the fracture site (hip, distal radius, or spine), the patient should be tested for osteoporosis using DXA, and the appropriate treatment considered based on the DXA results. Ruthann Cancer, MD [facility] Radiology Electronically Signed   By: Glade Nurse.D.  On: 11/14/2020 15:16        Scheduled Meds:  allopurinol  300 mg Oral Daily   carvedilol  1.5625 mg Oral BID WC   dapagliflozin propanediol  5 mg Oral QAC breakfast   gabapentin  300 mg Oral BID   insulin aspart  0-9 Units Subcutaneous TID WC   insulin glargine-yfgn  5 Units Subcutaneous Daily   sertraline  100 mg Oral QHS   Warfarin - Pharmacist Dosing Inpatient   Does not apply q1600   Continuous Infusions:     LOS: 4 days    Time  spent: 36 minutes spent on chart review, discussion with nursing staff, consultants, updating family and interview/physical exam; more than 50% of that time was spent in counseling and/or coordination of care.    Gregroy Dombkowski J [place] DO [facility] Available via Epic secure chat 7am-7pm After these hours, please refer to coverage provider listed on [email or web address] 11/16/2020, 10:24 AM

## 2020-11-16 NOTE — Progress Notes (Signed)
[place] for warfarin Indication: atrial fibrillation   Allergies  Allergen Reactions   Sunflower Oil Swelling and Other (See Comments)    Tongue and lip swelling Other reaction(s): Other Tongue and lip swelling   Horse-Derived Products Other (See Comments)    Per allergy skin test UNSPECIFIED REACTION  Other reaction(s): Other Per allergy skin test UNSPECIFIED REACTION    Other Other (See Comments)    Tetanus Shot -- skin test reaction   Tetanus Toxoids Other (See Comments)    Per allergy skin test   Tetanus Toxoid     Other reaction(s): Other (See Comments) Rash(horse serum) Other reaction(s): Other Rash(horse serum)    Patient Measurements: Height: 5\' 11"  (180.3 cm) Weight: 78 kg (172 lb) IBW/kg (Calculated) : 75.3   Vital Signs: Temp: 98 F (36.7 C) (11/12 0514) BP: 148/96 (11/12 0514) Pulse Rate: 77 (11/12 1025)  Labs: Recent Labs    11/14/20 0308 11/15/20 0524 11/16/20 0737  HGB 13.1  --  11.7*  HCT 42.0  --  36.5*  PLT 195  --  192  LABPROT 18.0* 17.7* 21.5*  INR 1.5* 1.5* 1.9*  CREATININE 1.38* 1.50*  --      Estimated Creatinine Clearance: 41.1 mL/min (A) (by C-G formula based on SCr of 1.5 mg/dL (H)).  Medications:  PTA Warfarin  Assessment: 32 yoM admitted with compression fracture of the L2 lumbar vertebra s/p fall on 11/02/20. Bridged with enoxaparin for kyphoplasty which occurred on 11/10; now bridging back to warfarin per Rx dosing. PTA warfarin 7.5mg  daily except 10mg  on Sundays/Thursdays; LD 11/6 INR elevated on admission  Today, 11/16/2020: CBC WNL yesterday pre-procedure; not checked today INR remains subtherapeutic as expected after Vit K and holding warfarin, but with brisk increase this AM to near-therapeutic Eating < 50% of meals No major drug-drug interactions with warfarin identified No bridging needed per Hospitalist  Goal of Therapy:  INR 2-3 Monitor platelets by anticoagulation  protocol: Yes   Plan:  Warfarin 5 mg PO x1 tonight; reducing empirically with poor PO intake and rapid increase in INR - if remains < 2.5 tomorrow can resume PTA dosing Daily INR; CBC as needed Monitor for signs of bleeding or thrombosis   Quaniya Damas A, PharmD 11/16/2020,11:33 AM

## 2020-11-16 NOTE — Plan of Care (Signed)

## 2020-11-17 LAB — GLUCOSE, CAPILLARY
Glucose-Capillary: 231 mg/dL — ABNORMAL HIGH (ref 70–99)
Glucose-Capillary: 165 mg/dL — ABNORMAL HIGH (ref 70–99)
Glucose-Capillary: 144 mg/dL — ABNORMAL HIGH (ref 70–99)

## 2020-11-17 LAB — CBC
HCT: 45 % (ref 39.0–52.0)
Hemoglobin: 14.7 g/dL (ref 13.0–17.0)
MCH: 31.1 pg (ref 26.0–34.0)
MCHC: 32.7 g/dL (ref 30.0–36.0)
MCV: 95.1 fL (ref 80.0–100.0)
Platelets: 231 10*3/uL (ref 150–400)
RBC: 4.73 MIL/uL (ref 4.22–5.81)
RDW: 17.3 % — ABNORMAL HIGH (ref 11.5–15.5)
WBC: 12.6 10*3/uL — ABNORMAL HIGH (ref 4.0–10.5)
nRBC: 0 % (ref 0.0–0.2)

## 2020-11-17 LAB — BASIC METABOLIC PANEL
Anion gap: 14 (ref 5–15)
BUN: 44 mg/dL — ABNORMAL HIGH (ref 8–23)
CO2: 25 mmol/L (ref 22–32)
Calcium: 8.9 mg/dL (ref 8.9–10.3)
Chloride: 99 mmol/L (ref 98–111)
Creatinine, Ser: 1.33 mg/dL — ABNORMAL HIGH (ref 0.61–1.24)
GFR, Estimated: 54 mL/min — ABNORMAL LOW (ref >60)
Glucose, Bld: 156 mg/dL — ABNORMAL HIGH (ref 70–99)
Potassium: 3.5 mmol/L (ref 3.5–5.1)
Sodium: 138 mmol/L (ref 135–145)

## 2020-11-17 LAB — PROTIME-INR
INR: 2.1 — ABNORMAL HIGH (ref 0.8–1.2)
Prothrombin Time: 23.7 seconds — ABNORMAL HIGH (ref 11.4–15.2)

## 2020-11-17 MED ORDER — WARFARIN SODIUM 5 MG PO TABS
10.0000 mg | ORAL_TABLET | Freq: Once | ORAL | Status: AC
  Administered 2020-11-17: 10 mg via ORAL
  Filled 2020-11-17: qty 2

## 2020-11-17 MED ORDER — CARVEDILOL 3.125 MG PO TABS
3.1250 mg | ORAL_TABLET | Freq: Two times a day (BID) | ORAL | Status: DC
  Administered 2020-11-17 – 2020-11-19 (×4): 3.125 mg via ORAL
  Filled 2020-11-17 (×4): qty 1

## 2020-11-17 MED ORDER — POTASSIUM CHLORIDE CRYS ER 20 MEQ PO TBCR
40.0000 meq | EXTENDED_RELEASE_TABLET | Freq: Once | ORAL | Status: AC
  Administered 2020-11-17: 40 meq via ORAL
  Filled 2020-11-17: qty 2

## 2020-11-17 NOTE — Plan of Care (Signed)

## 2020-11-17 NOTE — Progress Notes (Signed)
[place] for warfarin Indication: atrial fibrillation   Allergies  Allergen Reactions   Sunflower Oil Swelling and Other (See Comments)    Tongue and lip swelling Other reaction(s): Other Tongue and lip swelling   Horse-Derived Products Other (See Comments)    Per allergy skin test UNSPECIFIED REACTION  Other reaction(s): Other Per allergy skin test UNSPECIFIED REACTION    Other Other (See Comments)    Tetanus Shot -- skin test reaction   Tetanus Toxoids Other (See Comments)    Per allergy skin test   Tetanus Toxoid     Other reaction(s): Other (See Comments) Rash(horse serum) Other reaction(s): Other Rash(horse serum)    Patient Measurements: Height: 5\' 11"  (180.3 cm) Weight: 76.9 kg (169 lb 8.5 oz) IBW/kg (Calculated) : 75.3   Vital Signs: Temp: 97.9 F (36.6 C) (11/13 0525) BP: 147/96 (11/13 0525) Pulse Rate: 101 (11/13 0525)  Labs: Recent Labs    11/15/20 0524 11/16/20 0737 11/17/20 0336  HGB  --  11.7* 14.7  HCT  --  36.5* 45.0  PLT  --  192 231  LABPROT 17.7* 21.5* 23.7*  INR 1.5* 1.9* 2.1*  CREATININE 1.50*  --  1.33*     Estimated Creatinine Clearance: 46.4 mL/min (A) (by C-G formula based on SCr of 1.33 mg/dL (H)).  Medications:  PTA Warfarin  Assessment: 68 yoM admitted with compression fracture of the L2 lumbar vertebra s/p fall on 11/02/20. Bridged with enoxaparin for kyphoplasty which occurred on 11/10; now bridging back to warfarin per Rx dosing. PTA warfarin 7.5mg  daily except 10mg  on Sundays/Thursdays; LD 11/6 INR elevated on admission  Today, 11/17/2020: CBC WNL INR now therapeutic Eating 50-100% of meals No major drug-drug interactions with warfarin identified No bridging needed per Hospitalist  Goal of Therapy:  INR 2-3 Monitor platelets by anticoagulation protocol: Yes   Plan:  Warfarin 10 mg PO x1 tonight - based on INR trend up to this point, would resume PTA regimen at  discharge Daily INR; CBC as needed Monitor for signs of bleeding or thrombosis   Billiejean Schimek A, PharmD 11/17/2020,12:49 PM

## 2020-11-17 NOTE — Plan of Care (Signed)
  Problem: Clinical Measurements: Goal: Ability to maintain clinical measurements within normal limits will improve Outcome: Progressing   Problem: Activity: Goal: Risk for activity intolerance will decrease Outcome: Progressing   Problem: Pain Managment: Goal: General experience of comfort will improve Outcome: Progressing   

## 2020-11-17 NOTE — Progress Notes (Signed)
, PROGRESS NOTE    [name].  BSJ:[id number] DOB: 04/22/1940 DOA: 11/11/2020 PCP: [name], MD    Brief Narrative:  Cory Furey. is a 80 year old male with past medical history significant for atrial flutter s/p DCCV, CAD, adrenal adenoma, CKD stage IIIa, chronic diastolic congestive heart failure, type 2 diabetes mellitus, depression, GERD, essential hypertension, OSA, multiple falls with recent L2 compression fracture treated with TLSO brace and pain medication who presents to [facility] ED on 11/7 with continued low back pain following recurrent fall.  In the ED, afebrile, BP 160/107, creatinine 1.52, WBC 16.2, INR 3.4, COVID-19 PCR/influenza A/B PCR negative.  CT head/C-spine with no acute intracranial abnormality and no cervical fracture/listhesis.  CT L-spine showed interval progression of compression deformity L2 with vertebral body now approximately 6% height loss with approximately 2 mm retropulsion into spinal canal of the superior endplate.  Hip x-rays with no acute findings.  IR was consulted for kyphoplasty.  Duration consulted for further evaluation and management of acute pain secondary to recurrent falls with lumbar compression fracture.   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  CT L-spine notable for interval progression of L2 compression fracture now with 50% height loss with 2 mm retropulsion into the spinal canal of the superior endplate.  Patient without bowel or bladder incontinence and no weakness.  Patient underwent kyphoplasty L2 segment by interventional radiology, Dr. Serafina Royals on 11/14/2020. --Continue TLSO brace --Oxycodone-acetaminophen 5-325 mg, 1-2 tablets q4h PRN moderate pain --Morphine 1-2 mg IV q4h PRN severe pain --PT/OT currently recommending SNF placement, TOC for assistance and pending bed offers  Essential hypertension BP 147/96 this morning --Carvedilol 3.125 mg twice daily --Continue to monitor BP closely  Paroxysmal atrial fibrillation s/P MAZE procedure Hx atrial flutter s/p DCCV On anticoagulation with Coumadin outpatient. --Carvedilol 3.125 mg twice daily --Coumadin, pharmacy consulted for dosing/monitoring; INR 2.1 today (goal 2-3)  Chronic diastolic congestive heart failure, compensated Home medications include metolazone 5 mg p.o. daily as needed, carvedilol 3.125 mg p.o. twice daily, torsemide 150 mg p.o. daily. --Continue carvedilol 3.125 mg BID --Torsemide 150 mg PO daily --Holding home metolazone --Strict I's and O's and daily weights  Supratherapeutic INR: Resolved On Coumadin outpatient for A. fib/flutter.  INR 3.4 on admission, s/p 1 dose of vitamin K IV. --INR 3.4>1.7>1.5>1.5>2.1  -- Continue Coumadin, pharmacy consulted for dosing/monitoring --INR daily  CKD stage IIIa --Cr 1.52>1.34>1.51>1.38>1.50>1.33, stable (baseline 1.6-2.2) --Avoid nephrotoxins, renal dose all medications  Hypokalemia: Potassium 3.5 this morning, will replete.  Hyperlipidemia On atorvastatin 20 mg 3 x weekly on Monday/Wednesday/Friday. --Hold statin for now  Type 2 diabetes mellitus Home regimen includes NPH 70/30 45 units [place] daily with breakfast and Farxiga 5 mg p.o. daily.  Hemoglobin A1c  6.3, well  controlled. --Farxiga 5 mg p.o. daily --Holding home NPH --Start Semglee 5u [place] daily --Sensitive SSI for coverage --CBGs qAC/HS  Diabetic neuropathy: Gabapentin 300 mg p.o. twice daily  Hx gout: Allopurinol 300 mg p.o. daily  Anxiety/depression: --Zoloft 1 mg p.o. daily --Xanax 0.5 mg p.o. nightly as needed  DVT prophylaxis:  coumadin   Code Status: Full Code Family Communication: No family present at bedside this morning, updated patient's spouse, [name] via telephone this morning  Disposition Plan:  Level of care: Med-Surg Status is: Inpatient  Remains inpatient appropriate because: Pending SNF placement   Consultants:  Interventional radiology  Procedures:  L2 kyphoplasty, interventional radiology, Dr. Serafina Royals; 11/14/20  Antimicrobials:  None   Subjective: Patient seen examined bedside, resting comfortably.  Continues with mild lower back pain, improves with pain medication.  No family present at bedside this morning.  Updated spouse this morning.  No other questions or concerns at this time.  Pending SNF placement. Denies headache, no fever/chills/night sweats, no nausea/vomiting/diarrhea, no chest pain, no palpitations, no abdominal pain, no cough/congestion.  No acute events overnight per nurse staff.  Objective: Vitals:   11/16/20 1025 11/16/20 1446 11/16/20 2034 11/17/20 0525  BP:  (!) 134/93 (!) 138/95 (!) 147/96  Pulse: 77 (!) 59 74 (!) 101  Resp:  16 17 17   Temp:  (!) 97.5 F (36.4 C) 97.8 F (36.6 C) 97.9 F (36.6 C)  TempSrc:  Oral    SpO2: 92% 99% 97% 93%  Weight:    76.9 kg  Height:        Intake/Output Summary (Last 24 hours) at 11/17/2020 1210 Last data filed at 11/17/2020 1023 Gross per 24 hour  Intake 1080 ml  Output 5100 ml  Net -4020 ml   Filed Weights   11/11/20 1157 11/17/20 0525  Weight: 78 kg 76.9 kg    Examination:  General exam: Appears calm and comfortable  Respiratory system: Clear to auscultation. Respiratory effort  normal.  On room air Cardiovascular system: S1 & S2 heard, RRR. No JVD, murmurs, rubs, gallops or clicks. No pedal edema. Gastrointestinal system: Abdomen is nondistended, soft and nontender. No organomegaly or masses felt. Normal bowel sounds heard. Central nervous system: Alert and oriented. No focal neurological deficits. Extremities: Symmetric 5 x 5 power. Skin: No rashes, lesions or ulcers Psychiatry: Judgement and insight appear normal. Mood & affect appropriate.     Data Reviewed: I have personally reviewed following labs and imaging studies  CBC: Recent Labs  Lab 11/11/20 1414 11/12/20 0309 11/13/20 0321 11/14/20 0308 11/16/20 0737 11/17/20 0336  WBC 16.2* 10.9* 12.2* 12.3* 13.0* 12.6*  NEUTROABS 14.2*  --   --   --   --   --   HGB 13.3 12.5* 12.3* 13.1 11.7* 14.7  HCT 41.2 39.6 39.6 42.0 36.5* 45.0  MCV 96.5 97.1 97.8 98.4 97.3 95.1  PLT 187 175 198 195 192 329   Basic Metabolic Panel: Recent Labs  Lab 11/12/20 0309 11/13/20 0321 11/14/20 0308 11/15/20 0524 11/17/20 0336  NA 136 134* 135 135 138  K 4.6 4.7 4.7 4.4 3.5  CL 108 105 106 104 99  CO2 17* 22 20* 19* 25  GLUCOSE 111* 99 161* 165* 156*  BUN 38* 35* 39* 42* 44*  CREATININE 1.34* 1.51* 1.38* 1.50* 1.33*  CALCIUM 8.3* 8.5* 8.5* 8.8* 8.9   GFR: Estimated Creatinine Clearance: 46.4 mL/min (A) (by C-G formula based on SCr of 1.33 mg/dL (H)). Liver Function Tests: Recent Labs  Lab 11/12/20 0309  AST 19  ALT 27  ALKPHOS 195*  BILITOT 1.3*  PROT 6.2*  ALBUMIN 3.4*   No results for input(s): LIPASE, AMYLASE in the last 168 hours. No results for input(s): AMMONIA in the last 168 hours. Coagulation Profile: Recent Labs  Lab 11/13/20 0321 11/14/20 0308 11/15/20 0524 11/16/20 0737 11/17/20 0336  INR 1.7* 1.5* 1.5* 1.9* 2.1*   Cardiac Enzymes: No results for input(s): CKTOTAL, CKMB, CKMBINDEX, TROPONINI in the last 168 hours. BNP (last 3 results) No results for input(s): PROBNP in the last  8760 hours. HbA1C: No results for input(s): HGBA1C in the last 72 hours. CBG: Recent Labs  Lab 11/16/20 1154 11/16/20 1728 11/16/20 2035 11/17/20 0835 11/17/20 1145  GLUCAP 193* 124* 135* 231* 165*   Lipid Profile: No results for input(s): CHOL, HDL, LDLCALC, TRIG, CHOLHDL, LDLDIRECT in the last 72 hours. Thyroid Function Tests: No results for input(s): TSH, T4TOTAL, FREET4, T3FREE, THYROIDAB in the last 72 hours. Anemia Panel: No results for input(s): VITAMINB12, FOLATE, FERRITIN, TIBC, IRON, RETICCTPCT in the last 72 hours. Sepsis Labs: No results for input(s): PROCALCITON, LATICACIDVEN in the last 168 hours.  Recent Results (from the past 240 hour(s))  Resp Panel by RT-PCR (Flu A&B, Covid) Nasopharyngeal Swab     Status: None   Collection Time: 11/11/20  5:50 PM   Specimen: Nasopharyngeal Swab; Nasopharyngeal(NP) swabs in vial transport medium  Result Value Ref Range Status   SARS Coronavirus 2 by RT PCR NEGATIVE NEGATIVE Final    Comment: (NOTE) SARS-CoV-2 target nucleic acids are NOT DETECTED.  The SARS-CoV-2 RNA is generally detectable in upper respiratory specimens during the acute phase of infection. The lowest concentration of SARS-CoV-2 viral copies this assay can detect is 138 copies/mL. A negative result does not preclude SARS-Cov-2 infection and should not be used as the sole basis for treatment or other patient management decisions. A negative result may occur with  improper specimen collection/handling, submission of specimen other than nasopharyngeal swab, presence of viral mutation(s) within the areas targeted by this assay, and inadequate number of viral copies(<138 copies/mL). A negative result must be combined with clinical observations, patient history, and epidemiological information. The expected result is Negative.  Fact Sheet for Patients:  EntrepreneurPulse.com.au  Fact Sheet for Healthcare Providers:   IncredibleEmployment.be  This test is no t yet approved or cleared by the [place] FDA and  has been authorized for detection and/or diagnosis of SARS-CoV-2 by FDA under an Emergency Use Authorization (EUA). This EUA will remain  in effect (meaning this test can be used) for the duration of the COVID-19 declaration under Section 564(b)(1) of the Act, 21 U.S.C.section 360bbb-3(b)(1), unless the authorization is terminated  or revoked sooner.       Influenza A by PCR NEGATIVE NEGATIVE Final   Influenza B by PCR NEGATIVE NEGATIVE Final    Comment: (NOTE) The Xpert Xpress SARS-CoV-2/FLU/RSV plus assay is intended as an aid in the diagnosis of influenza from Nasopharyngeal swab specimens and should not be used as a sole basis for treatment. Nasal washings and aspirates are unacceptable for Xpert Xpress SARS-CoV-2/FLU/RSV testing.  Fact Sheet for Patients: EntrepreneurPulse.com.au  Fact Sheet for Healthcare Providers: IncredibleEmployment.be  This test is not yet approved or cleared by the [place] FDA and has been authorized for detection and/or diagnosis of SARS-CoV-2 by FDA under an Emergency Use Authorization (EUA). This EUA will remain in effect (meaning this test can be used) for the duration of the COVID-19 declaration  under Section 564(b)(1) of the Act, 21 U.S.C. section 360bbb-3(b)(1), unless the authorization is terminated or revoked.  Performed at [facility], [place] [address], [place], [place] [address]          Radiology Studies: No results found.      Scheduled Meds:  allopurinol  300 mg Oral Daily   carvedilol  3.125 mg Oral BID WC   dapagliflozin propanediol  5 mg Oral QAC breakfast   gabapentin  300 mg Oral BID   insulin aspart  0-9 Units Subcutaneous TID WC   insulin glargine-yfgn  5 Units Subcutaneous Daily   sertraline  100 mg Oral QHS   torsemide  150 mg Oral  Daily   Warfarin - Pharmacist Dosing Inpatient   Does not apply q1600   Continuous Infusions:     LOS: 5 days    Time spent: 36 minutes spent on chart review, discussion with nursing staff, consultants, updating family and interview/physical exam; more than 50% of that time was spent in counseling and/or coordination of care.    Oluwatimilehin Balfour J [place] DO [facility] Available via Epic secure chat 7am-7pm After these hours, please refer to coverage provider listed on [email or web address] 11/17/2020, 12:10 PM

## 2020-11-18 LAB — GLUCOSE, CAPILLARY
Glucose-Capillary: 173 mg/dL — ABNORMAL HIGH (ref 70–99)
Glucose-Capillary: 169 mg/dL — ABNORMAL HIGH (ref 70–99)
Glucose-Capillary: 264 mg/dL — ABNORMAL HIGH (ref 70–99)
Glucose-Capillary: 183 mg/dL — ABNORMAL HIGH (ref 70–99)
Glucose-Capillary: 262 mg/dL — ABNORMAL HIGH (ref 70–99)

## 2020-11-18 LAB — RESP PANEL BY RT-PCR (FLU A&B, COVID) ARPGX2
Influenza A by PCR: NEGATIVE
Influenza B by PCR: NEGATIVE
SARS Coronavirus 2 by RT PCR: NEGATIVE

## 2020-11-18 LAB — PROTIME-INR
INR: 2.6 — ABNORMAL HIGH (ref 0.8–1.2)
Prothrombin Time: 27.8 seconds — ABNORMAL HIGH (ref 11.4–15.2)

## 2020-11-18 MED ORDER — WARFARIN SODIUM 5 MG PO TABS
5.0000 mg | ORAL_TABLET | Freq: Once | ORAL | Status: AC
  Administered 2020-11-18: 5 mg via ORAL
  Filled 2020-11-18: qty 1

## 2020-11-18 MED ORDER — INSULIN GLARGINE-YFGN 100 UNIT/ML ~~LOC~~ SOLN
8.0000 [IU] | Freq: Every day | SUBCUTANEOUS | Status: DC
  Administered 2020-11-19: 8 [IU] via SUBCUTANEOUS
  Filled 2020-11-18: qty 0.08

## 2020-11-18 NOTE — Progress Notes (Signed)
, PROGRESS NOTE    [name].  XIP:[id number] DOB: 1940-12-26 DOA: 11/11/2020 PCP: [name], MD    Brief Narrative:  [name]. is a 80 year old male with past medical history significant for atrial flutter s/p DCCV, CAD, adrenal adenoma, CKD stage IIIa, chronic diastolic congestive heart failure, type 2 diabetes mellitus, depression, GERD, essential hypertension, OSA, multiple falls with recent L2 compression fracture treated with TLSO brace and pain medication who presents to [facility] ED on 11/7 with continued low back pain following recurrent fall.  In the ED, afebrile, BP 160/107, creatinine 1.52, WBC 16.2, INR 3.4, COVID-19 PCR/influenza A/B PCR negative.  CT head/C-spine with no acute intracranial abnormality and no cervical fracture/listhesis.  CT L-spine showed interval progression of compression deformity L2 with vertebral body now approximately 6% height loss with approximately 2 mm retropulsion into spinal canal of the superior endplate.  Hip x-rays with no acute findings.  IR was consulted for kyphoplasty.  Duration consulted for further evaluation and management of acute pain secondary to recurrent falls with lumbar compression fracture.   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  CT L-spine notable for interval progression of L2 compression fracture now with 50% height loss with 2 mm retropulsion into the spinal canal of the superior endplate.  Patient without bowel or bladder incontinence and no weakness.  Patient underwent kyphoplasty L2 segment by interventional radiology, Dr. Serafina Royals on 11/14/2020. --Continue TLSO brace --Oxycodone-acetaminophen 5-325 mg, 1-2 tablets q4h PRN moderate pain --Morphine 1-2 mg IV q4h PRN severe pain --PT/OT currently recommending SNF placement, per TOC, to bed offers today, they will follow-up with family, hopeful for discharge tomorrow following insurance authorization  Essential hypertension BP 138/80 this morning --Carvedilol 3.125 mg twice daily --Continue to monitor BP closely  Paroxysmal atrial fibrillation s/P MAZE procedure Hx atrial flutter s/p DCCV On anticoagulation with Coumadin outpatient. --Carvedilol 3.125 mg twice daily --Coumadin, pharmacy consulted for dosing/monitoring; INR 2.1 today (goal 2-3)  Chronic diastolic congestive heart failure, compensated Home medications include metolazone 5 mg p.o. daily as needed, carvedilol 3.125 mg p.o. twice daily, torsemide 150 mg p.o. daily. --Continue carvedilol 3.125 mg BID --Torsemide 150 mg PO daily --Holding home metolazone --Strict I's and O's and daily weights  Supratherapeutic INR: Resolved On Coumadin outpatient for A. fib/flutter.  INR 3.4 on admission, s/p 1 dose of vitamin K IV. --INR 3.4>1.7>1.5>1.5>2.1>2.6  -- Continue Coumadin, pharmacy consulted for dosing/monitoring --INR daily  CKD stage IIIa --Cr 1.52>1.34>1.51>1.38>1.50>1.33, stable (baseline 1.6-2.2) --Avoid nephrotoxins, renal dose all medications  Hypokalemia: Repleted during hospitalization.  Hyperlipidemia On atorvastatin 20 mg 3 x weekly on Monday/Wednesday/Friday. --Hold statin for now  Type 2 diabetes mellitus Home regimen includes NPH 70/30 45 units [place] daily  with breakfast  and Farxiga 5 mg p.o. daily.  Hemoglobin A1c 6.3, well controlled. --Farxiga 5 mg p.o. daily --Holding home NPH --Semglee 8u [place] daily --Sensitive SSI for coverage --CBGs qAC/HS  Diabetic neuropathy: Gabapentin 300 mg p.o. twice daily  Hx gout: Allopurinol 300 mg p.o. daily  Anxiety/depression: --Zoloft 1 mg p.o. daily --Xanax 0.5 mg p.o. nightly as needed  DVT prophylaxis:  warfarin (COUMADIN) tablet 5 mg coumadin   Code Status: Full Code Family Communication: No family present at bedside this morning, attempted to update patient's spouse Velva Harman via telephone this morning, unsuccessful as no answer.  Velva Harman was updated extensively yesterday afternoon.    Disposition Plan:  Level of care: Med-Surg Status is: Inpatient  Remains inpatient appropriate because: Pending SNF placement   Consultants:  Interventional radiology  Procedures:  L2 kyphoplasty, interventional radiology, Dr. Serafina Royals; 11/14/20  Antimicrobials:  None   Subjective: Patient seen examined bedside, resting comfortably.  Eating breakfast.  Complains of no back pain this morning.  Discussed that social work has 2 bed offers currently pending and they will discuss with him and his family so hopeful for discharge tomorrow.  Denies headache, no fever/chills/night sweats, no nausea/vomiting/diarrhea, no chest pain, no palpitations, no abdominal pain, no cough/congestion.  No acute events overnight per nurse staff.  Objective: Vitals:   11/17/20 1419 11/17/20 2213 11/18/20 0430 11/18/20 0455  BP: 122/78 131/80 138/80   Pulse: 93 66 72   Resp: 16 16 14    Temp: 98 F (36.7 C) 97.6 F (36.4 C) 98.2 F (36.8 C)   TempSrc: Oral Axillary Oral   SpO2: 92% 95% 95%   Weight:    73.3 kg  Height:        Intake/Output Summary (Last 24 hours) at 11/18/2020 1056 Last data filed at 11/18/2020 0700 Gross per 24 hour  Intake 720 ml  Output 3575 ml  Net -2855 ml   Filed Weights   11/11/20 1157 11/17/20  0525 11/18/20 0455  Weight: 78 kg 76.9 kg 73.3 kg    Examination:  General exam: Appears calm and comfortable  Respiratory system: Clear to auscultation. Respiratory effort normal.  On room air Cardiovascular system: S1 & S2 heard, RRR. No JVD, murmurs, rubs, gallops or clicks. No pedal edema. Gastrointestinal system: Abdomen is nondistended, soft and nontender. No organomegaly or masses felt. Normal bowel sounds heard. Central nervous system: Alert and oriented. No focal neurological deficits. Extremities: Symmetric 5 x 5 power. Skin: No rashes, lesions or ulcers Psychiatry: Judgement and insight appear normal. Mood & affect appropriate.     Data Reviewed: I have personally reviewed following labs and imaging studies  CBC: Recent Labs  Lab 11/11/20 1414 11/12/20 0309 11/13/20 0321 11/14/20 0308 11/16/20 0737 11/17/20 0336  WBC 16.2* 10.9* 12.2* 12.3* 13.0* 12.6*  NEUTROABS 14.2*  --   --   --   --   --   HGB 13.3 12.5* 12.3* 13.1 11.7* 14.7  HCT 41.2 39.6 39.6 42.0 36.5* 45.0  MCV 96.5 97.1 97.8 98.4 97.3 95.1  PLT 187 175 198 195 192 938   Basic Metabolic Panel: Recent Labs  Lab 11/12/20 0309 11/13/20 0321 11/14/20 0308 11/15/20 0524 11/17/20 0336  NA 136 134* 135 135 138  K 4.6 4.7 4.7 4.4 3.5  CL 108 105 106 104 99  CO2 17* 22 20* 19* 25  GLUCOSE 111* 99 161* 165* 156*  BUN 38* 35* 39* 42* 44*  CREATININE 1.34* 1.51* 1.38* 1.50* 1.33*  CALCIUM 8.3* 8.5* 8.5* 8.8* 8.9  GFR: Estimated Creatinine Clearance: 45.2 mL/min (A) (by C-G formula based on SCr of 1.33 mg/dL (H)). Liver Function Tests: Recent Labs  Lab 11/12/20 0309  AST 19  ALT 27  ALKPHOS 195*  BILITOT 1.3*  PROT 6.2*  ALBUMIN 3.4*   No results for input(s): LIPASE, AMYLASE in the last 168 hours. No results for input(s): AMMONIA in the last 168 hours. Coagulation Profile: Recent Labs  Lab 11/14/20 0308 11/15/20 0524 11/16/20 0737 11/17/20 0336 11/18/20 0325  INR 1.5* 1.5* 1.9* 2.1*  2.6*   Cardiac Enzymes: No results for input(s): CKTOTAL, CKMB, CKMBINDEX, TROPONINI in the last 168 hours. BNP (last 3 results) No results for input(s): PROBNP in the last 8760 hours. HbA1C: No results for input(s): HGBA1C in the last 72 hours. CBG: Recent Labs  Lab 11/17/20 0835 11/17/20 1145 11/17/20 1654 11/17/20 2213 11/18/20 0754  GLUCAP 231* 165* 144* 173* 169*   Lipid Profile: No results for input(s): CHOL, HDL, LDLCALC, TRIG, CHOLHDL, LDLDIRECT in the last 72 hours. Thyroid Function Tests: No results for input(s): TSH, T4TOTAL, FREET4, T3FREE, THYROIDAB in the last 72 hours. Anemia Panel: No results for input(s): VITAMINB12, FOLATE, FERRITIN, TIBC, IRON, RETICCTPCT in the last 72 hours. Sepsis Labs: No results for input(s): PROCALCITON, LATICACIDVEN in the last 168 hours.  Recent Results (from the past 240 hour(s))  Resp Panel by RT-PCR (Flu A&B, Covid) Nasopharyngeal Swab     Status: None   Collection Time: 11/11/20  5:50 PM   Specimen: Nasopharyngeal Swab; Nasopharyngeal(NP) swabs in vial transport medium  Result Value Ref Range Status   SARS Coronavirus 2 by RT PCR NEGATIVE NEGATIVE Final    Comment: (NOTE) SARS-CoV-2 target nucleic acids are NOT DETECTED.  The SARS-CoV-2 RNA is generally detectable in upper respiratory specimens during the acute phase of infection. The lowest concentration of SARS-CoV-2 viral copies this assay can detect is 138 copies/mL. A negative result does not preclude SARS-Cov-2 infection and should not be used as the sole basis for treatment or other patient management decisions. A negative result may occur with  improper specimen collection/handling, submission of specimen other than nasopharyngeal swab, presence of viral mutation(s) within the areas targeted by this assay, and inadequate number of viral copies(<138 copies/mL). A negative result must be combined with clinical observations, patient history, and  epidemiological information. The expected result is Negative.  Fact Sheet for Patients:  EntrepreneurPulse.com.au  Fact Sheet for Healthcare Providers:  IncredibleEmployment.be  This test is no t yet approved or cleared by the [place] FDA and  has been authorized for detection and/or diagnosis of SARS-CoV-2 by FDA under an Emergency Use Authorization (EUA). This EUA will remain  in effect (meaning this test can be used) for the duration of the COVID-19 declaration under Section 564(b)(1) of the Act, 21 U.S.C.section 360bbb-3(b)(1), unless the authorization is terminated  or revoked sooner.       Influenza A by PCR NEGATIVE NEGATIVE Final   Influenza B by PCR NEGATIVE NEGATIVE Final    Comment: (NOTE) The Xpert Xpress SARS-CoV-2/FLU/RSV plus assay is intended as an aid in the diagnosis of influenza from Nasopharyngeal swab specimens and should not be used as a sole basis for treatment. Nasal washings and aspirates are unacceptable for Xpert Xpress SARS-CoV-2/FLU/RSV testing.  Fact Sheet for Patients: EntrepreneurPulse.com.au  Fact Sheet for Healthcare Providers: IncredibleEmployment.be  This test is not yet approved or cleared by the [place] FDA and has been authorized for detection and/or diagnosis of SARS-CoV-2 by FDA under an  Emergency Use Authorization (EUA). This EUA will remain in effect (meaning this test can be used) for the duration of the COVID-19 declaration under Section 564(b)(1) of the Act, 21 U.S.C. section 360bbb-3(b)(1), unless the authorization is terminated or revoked.  Performed at [facility], [place] [address], [place], [place] [address]          Radiology Studies: No results found.      Scheduled Meds:  allopurinol  300 mg Oral Daily   carvedilol  3.125 mg Oral BID WC   dapagliflozin propanediol  5 mg Oral QAC breakfast   gabapentin   300 mg Oral BID   insulin aspart  0-9 Units Subcutaneous TID WC   insulin glargine-yfgn  5 Units Subcutaneous Daily   sertraline  100 mg Oral QHS   torsemide  150 mg Oral Daily   warfarin  5 mg Oral ONCE-1600   Warfarin - Pharmacist Dosing Inpatient   Does not apply q1600   Continuous Infusions:     LOS: 6 days    Time spent: 36 minutes spent on chart review, discussion with nursing staff, consultants, updating family and interview/physical exam; more than 50% of that time was spent in counseling and/or coordination of care.    Siana Panameno J [place] DO [facility] Available via Epic secure chat 7am-7pm After these hours, please refer to coverage provider listed on [email or web address] 11/18/2020, 10:56 AM

## 2020-11-18 NOTE — Progress Notes (Signed)
[facility] for warfarin Indication: atrial fibrillation   Allergies  Allergen Reactions   Sunflower Oil Swelling and Other (See Comments)    Tongue and lip swelling Other reaction(s): Other Tongue and lip swelling   Horse-Derived Products Other (See Comments)    Per allergy skin test UNSPECIFIED REACTION  Other reaction(s): Other Per allergy skin test UNSPECIFIED REACTION    Other Other (See Comments)    Tetanus Shot -- skin test reaction   Tetanus Toxoids Other (See Comments)    Per allergy skin test   Tetanus Toxoid     Other reaction(s): Other (See Comments) Rash(horse serum) Other reaction(s): Other Rash(horse serum)    Patient Measurements: Height: 5\' 11"  (180.3 cm) Weight: 73.3 kg (161 lb 9.6 oz) IBW/kg (Calculated) : 75.3   Vital Signs: Temp: 98.2 F (36.8 C) (11/14 0430) Temp Source: Oral (11/14 0430) BP: 138/80 (11/14 0430) Pulse Rate: 72 (11/14 0430)  Labs: Recent Labs    11/16/20 0737 11/17/20 0336 11/18/20 0325  HGB 11.7* 14.7  --   HCT 36.5* 45.0  --   PLT 192 231  --   LABPROT 21.5* 23.7* 27.8*  INR 1.9* 2.1* 2.6*  CREATININE  --  1.33*  --      Estimated Creatinine Clearance: 45.2 mL/min (A) (by C-G formula based on SCr of 1.33 mg/dL (H)).  Medications:  PTA Warfarin  Assessment: 77 yoM admitted with compression fracture of the L2 lumbar vertebra s/p fall on 11/02/20. Bridged with enoxaparin for kyphoplasty which occurred on 11/10; now bridging back to warfarin per Rx dosing. PTA warfarin 7.5mg  daily except 10mg  on Sundays/Thursdays; LD 11/6 INR elevated on admission  Today, 11/18/2020: CBC WNL (last 11/13) INR therapeutic at 2.6 (rapid increase from 2.1 yesterday)  Variable meal intake> 20-100% of meals in past 24 hours DDI: allopurinol may increase INR (allopurinol continued from outpatient) No bridging needed per Hospitalist  Goal of Therapy:  INR 2-3 Monitor platelets by anticoagulation  protocol: Yes   Plan:  Warfarin 5 mg PO x1 tonight; reducing empirically with rapid increase in INR and variable PO intake Daily INR; CBC as needed Monitor for signs of bleeding or thrombosis   Dimple Nanas, PharmD 11/18/2020,10:24 AM

## 2020-11-18 NOTE — TOC Progression Note (Signed)
Transition of Care (TOC) - Progression Note   Patient Details  Name: [name]. MRN: [id number] Date of Birth: 07-28-1940  [facility]) CM/SW Contact  Sherie Don, LCSW Phone Number: 11/18/2020, 12:54 PM  Clinical Narrative: Patient has bed offers and selected [facility]. PT put in updated PT note. CSW confirmed with Lorenza Chick at [facility] that patient can be admitted tomorrow. CSW completed insurance authorization. Reference ID # is: [id number]. Patient has been approved for 11/19/2020-11/21/2020. COVID test to be ordered. TOC to follow.  Expected Discharge Plan: [place] (vs. SNF) Barriers to Discharge: Continued Medical Work up  Expected Discharge Plan and Services Expected Discharge Plan: [place] (vs. SNF) Living arrangements for the past 2 months: Single Family Home  Readmission Risk Interventions No flowsheet data found.

## 2020-11-18 NOTE — Progress Notes (Signed)
Physical Therapy Treatment Patient Details Name: [name]. MRN: [id number] DOB: 07/18/1940 Today's Date: 11/18/2020   History of Present Illness Pt is 80 yo male admitted on 11/11/20 with worsening of L2 compression deformity with ~60% height loss and 2 mm retropulsion.  Kyphoplasty L2 on 11/14/20.  Pt with hx of a flutter s/p DCCV, CAD, adrenal adenoma, CKD, diastolic heart failure, DM2, depression, GERD, HTN, falls, sleep apnea.    PT Comments    Pt much more alert and able to participate today.  He did have a drop in BP in sitting but was asymptomatic.(BP supine 146/95, Sitting 109/79 asymptomatic, unable to get reading in standing; sitting in chair 129/89 ).  Had assist of 2 for safety and pt requiring multimodal cues.  Distance was limited due to L LE pain, no pain in back.  Educated and positioned with pillows under knees to relieve pressure on hip/back.    Recommendations for follow up therapy are one component of a multi-disciplinary discharge planning process, led by the attending physician.  Recommendations may be updated based on patient status, additional functional criteria and insurance authorization.  Follow Up Recommendations  Skilled nursing-short term rehab (<3 hours/day)     Assistance Recommended at Discharge Frequent or constant Supervision/Assistance  Equipment Recommendations  Rolling walker (2 wheels)    Recommendations for Other Services       Precautions / Restrictions Precautions Precautions: Fall;Back Spinal Brace: Thoracolumbosacral orthotic;Applied in sitting position Restrictions Weight Bearing Restrictions: No     Mobility  Bed Mobility Overal bed mobility: Needs Assistance Bed Mobility: Rolling;Sidelying to Sit Rolling: +2 for safety/equipment;Min assist Sidelying to sit: Mod assist;+2 for safety/equipment       General bed mobility comments: Multimodal cues for log roll techinque with mod A to lift trunk    Transfers Overall  transfer level: Needs assistance Equipment used: Rolling walker (2 wheels) Transfers: Sit to/from Stand Sit to Stand: Min assist;+2 safety/equipment           General transfer comment: +2 for safety; cues for hand placement and safe sitting    Ambulation/Gait Ambulation/Gait assistance: Min assist;+2 safety/equipment Gait Distance (Feet): 6 Feet Assistive device: Rolling walker (2 wheels) Gait Pattern/deviations: Step-to pattern;Decreased stride length;Decreased weight shift to left;Shuffle Gait velocity: decrased     General Gait Details: Educated on step to L gait for pain control; small shuffle steps limited by pain   Stairs             Wheelchair Mobility    Modified Rankin (Stroke Patients Only)       Balance Overall balance assessment: Needs assistance Sitting-balance support: Bilateral upper extremity supported Sitting balance-Leahy Scale: Poor Sitting balance - Comments: Requiring UE suppoort and min A at times   Standing balance support: Bilateral upper extremity supported Standing balance-Leahy Scale: Poor Standing balance comment: Requiring RW and min A                            Cognition Arousal/Alertness: Awake/alert Behavior During Therapy: WFL for tasks assessed/performed Overall Cognitive Status: Within Functional Limits for tasks assessed                                          Exercises      General Comments General comments (skin integrity, edema, etc.): BP supine 146/95, Sitting 109/79 asymptomatic, unable  to get reading in standing; sitting in chair 129/89      Pertinent Vitals/Pain Pain Assessment: 0-10 Pain Score: 7  Pain Location: L groin and leg with movement (0/10 rest) Pain Descriptors / Indicators: Grimacing;Sharp Pain Intervention(s): Limited activity within patient's tolerance;Monitored during session;Repositioned (educated on positions of comfort)    Home Living                           Prior Function            PT Goals (current goals can now be found in the care plan section) Progress towards PT goals: Progressing toward goals    Frequency    Min 3X/week      PT Plan Current plan remains appropriate    Co-evaluation              AM-PAC PT "6 Clicks" Mobility   Outcome Measure  Help needed turning from your back to your side while in a flat bed without using bedrails?: A Lot Help needed moving from lying on your back to sitting on the side of a flat bed without using bedrails?: A Lot (+2 was for safety for all due to hx orthostatic) Help needed moving to and from a bed to a chair (including a wheelchair)?: A Lot Help needed standing up from a chair using your arms (e.g., wheelchair or bedside chair)?: A Lot Help needed to walk in hospital room?: A Lot Help needed climbing 3-5 steps with a railing? : Total 6 Click Score: 11    End of Session Equipment Utilized During Treatment: Back brace;Gait belt Activity Tolerance: Patient limited by pain Patient left: with chair alarm set;in chair;with call bell/phone within reach (pillows supporting back and under kneed) Nurse Communication: Mobility status PT Visit Diagnosis: Unsteadiness on feet (R26.81);Other abnormalities of gait and mobility (R26.89);Muscle weakness (generalized) (M62.81)     Time: 1000-1030 PT Time Calculation (min) (ACUTE ONLY): 30 min  Charges:  $Gait Training: 8-22 mins $Therapeutic Activity: 8-22 mins                     [name], PT Acute Rehab Services Pager [phone] [facility] [place] 11/18/2020, 11:27 AM

## 2020-11-19 DIAGNOSIS — I152 Hypertension secondary to endocrine disorders: Secondary | ICD-10-CM | POA: Diagnosis not present

## 2020-11-19 DIAGNOSIS — M109 Gout, unspecified: Secondary | ICD-10-CM | POA: Diagnosis not present

## 2020-11-19 DIAGNOSIS — E1122 Type 2 diabetes mellitus with diabetic chronic kidney disease: Secondary | ICD-10-CM | POA: Diagnosis not present

## 2020-11-19 DIAGNOSIS — Z794 Long term (current) use of insulin: Secondary | ICD-10-CM | POA: Diagnosis not present

## 2020-11-19 DIAGNOSIS — N183 Chronic kidney disease, stage 3 unspecified: Secondary | ICD-10-CM | POA: Diagnosis not present

## 2020-11-19 DIAGNOSIS — K2100 Gastro-esophageal reflux disease with esophagitis, without bleeding: Secondary | ICD-10-CM | POA: Diagnosis not present

## 2020-11-19 DIAGNOSIS — M25552 Pain in left hip: Secondary | ICD-10-CM | POA: Diagnosis not present

## 2020-11-19 DIAGNOSIS — S32000A Wedge compression fracture of unspecified lumbar vertebra, initial encounter for closed fracture: Secondary | ICD-10-CM | POA: Diagnosis not present

## 2020-11-19 DIAGNOSIS — Z7401 Bed confinement status: Secondary | ICD-10-CM | POA: Diagnosis not present

## 2020-11-19 DIAGNOSIS — E785 Hyperlipidemia, unspecified: Secondary | ICD-10-CM | POA: Diagnosis not present

## 2020-11-19 DIAGNOSIS — Z91199 Patient's noncompliance with other medical treatment and regimen due to unspecified reason: Secondary | ICD-10-CM | POA: Diagnosis not present

## 2020-11-19 DIAGNOSIS — Z951 Presence of aortocoronary bypass graft: Secondary | ICD-10-CM | POA: Diagnosis not present

## 2020-11-19 DIAGNOSIS — I480 Paroxysmal atrial fibrillation: Secondary | ICD-10-CM | POA: Diagnosis not present

## 2020-11-19 DIAGNOSIS — I10 Essential (primary) hypertension: Secondary | ICD-10-CM | POA: Diagnosis not present

## 2020-11-19 DIAGNOSIS — Z7901 Long term (current) use of anticoagulants: Secondary | ICD-10-CM | POA: Diagnosis not present

## 2020-11-19 DIAGNOSIS — I272 Pulmonary hypertension, unspecified: Secondary | ICD-10-CM | POA: Diagnosis not present

## 2020-11-19 DIAGNOSIS — G4733 Obstructive sleep apnea (adult) (pediatric): Secondary | ICD-10-CM | POA: Diagnosis not present

## 2020-11-19 DIAGNOSIS — M5386 Other specified dorsopathies, lumbar region: Secondary | ICD-10-CM | POA: Diagnosis not present

## 2020-11-19 DIAGNOSIS — E1159 Type 2 diabetes mellitus with other circulatory complications: Secondary | ICD-10-CM | POA: Diagnosis not present

## 2020-11-19 DIAGNOSIS — R296 Repeated falls: Secondary | ICD-10-CM | POA: Diagnosis not present

## 2020-11-19 DIAGNOSIS — I484 Atypical atrial flutter: Secondary | ICD-10-CM | POA: Diagnosis not present

## 2020-11-19 DIAGNOSIS — Z9889 Other specified postprocedural states: Secondary | ICD-10-CM | POA: Diagnosis not present

## 2020-11-19 DIAGNOSIS — S32020A Wedge compression fracture of second lumbar vertebra, initial encounter for closed fracture: Secondary | ICD-10-CM | POA: Diagnosis not present

## 2020-11-19 DIAGNOSIS — D72829 Elevated white blood cell count, unspecified: Secondary | ICD-10-CM | POA: Diagnosis not present

## 2020-11-19 DIAGNOSIS — E1169 Type 2 diabetes mellitus with other specified complication: Secondary | ICD-10-CM | POA: Diagnosis not present

## 2020-11-19 DIAGNOSIS — R2689 Other abnormalities of gait and mobility: Secondary | ICD-10-CM | POA: Diagnosis not present

## 2020-11-19 DIAGNOSIS — U071 COVID-19: Secondary | ICD-10-CM | POA: Diagnosis not present

## 2020-11-19 DIAGNOSIS — I482 Chronic atrial fibrillation, unspecified: Secondary | ICD-10-CM | POA: Diagnosis not present

## 2020-11-19 DIAGNOSIS — I2510 Atherosclerotic heart disease of native coronary artery without angina pectoris: Secondary | ICD-10-CM | POA: Diagnosis not present

## 2020-11-19 DIAGNOSIS — I4891 Unspecified atrial fibrillation: Secondary | ICD-10-CM | POA: Diagnosis not present

## 2020-11-19 DIAGNOSIS — I5032 Chronic diastolic (congestive) heart failure: Secondary | ICD-10-CM | POA: Diagnosis not present

## 2020-11-19 DIAGNOSIS — M2550 Pain in unspecified joint: Secondary | ICD-10-CM | POA: Diagnosis not present

## 2020-11-19 DIAGNOSIS — I110 Hypertensive heart disease with heart failure: Secondary | ICD-10-CM | POA: Diagnosis not present

## 2020-11-19 DIAGNOSIS — S32020D Wedge compression fracture of second lumbar vertebra, subsequent encounter for fracture with routine healing: Secondary | ICD-10-CM | POA: Diagnosis not present

## 2020-11-19 DIAGNOSIS — M6281 Muscle weakness (generalized): Secondary | ICD-10-CM | POA: Diagnosis not present

## 2020-11-19 LAB — GLUCOSE, CAPILLARY
Glucose-Capillary: 167 mg/dL — ABNORMAL HIGH (ref 70–99)
Glucose-Capillary: 230 mg/dL — ABNORMAL HIGH (ref 70–99)
Glucose-Capillary: 288 mg/dL — ABNORMAL HIGH (ref 70–99)

## 2020-11-19 LAB — PROTIME-INR
INR: 4 — ABNORMAL HIGH (ref 0.8–1.2)
Prothrombin Time: 38.6 seconds — ABNORMAL HIGH (ref 11.4–15.2)

## 2020-11-19 MED ORDER — OXYCODONE-ACETAMINOPHEN 5-325 MG PO TABS: 1.0000 | ORAL_TABLET | ORAL | 0 refills | Status: AC | PRN

## 2020-11-19 MED ORDER — INSULIN GLARGINE-YFGN 100 UNIT/ML ~~LOC~~ SOLN: 8.0000 [IU] | Freq: Every day | SUBCUTANEOUS | 11 refills | Status: AC

## 2020-11-19 MED ORDER — WARFARIN SODIUM 5 MG PO TABS: 5.0000 mg | ORAL_TABLET | Freq: Every day | ORAL | 3 refills | Status: AC

## 2020-11-19 MED ORDER — CARVEDILOL 3.125 MG PO TABS: 3.1250 mg | ORAL_TABLET | Freq: Two times a day (BID) | ORAL | Status: AC

## 2020-11-19 NOTE — TOC Transition Note (Signed)
Transition of Care [facility]) - CM/SW Discharge Note  Patient Details  Name: Jan Olano. MRN: [id number] Date of Birth: 18-Jun-1940  Transition of Care [facility]) CM/SW Contact:  Sherie Don, LCSW Phone Number: 11/19/2020, 11:57 AM  Clinical Narrative: Patient will discharge to [facility] today for rehab. COVID test is negative. Discharge summary, discharge orders, and SNF transfer report sent to facility in hub. Patient will go to room 101P and the number for report is [phone].  Medical necessity form done; PTAR scheduled. Discharge packet completed. Patient notified of discharge. RN updated. TOC signing off.  Final next level of care: Skilled Nursing Facility Barriers to Discharge: Barriers Resolved  Patient Goals and CMS Choice Patient states their goals for this hospitalization and ongoing recovery are:: Go to rehab CMS Medicare.gov Compare Post Acute Care list provided to:: Patient Choice offered to / list presented to : Patient  Discharge Placement PASRR number recieved: 11/15/20  Patient chooses bed at: [facility] Patient to be transferred to facility by: PTAR Patient and family notified of of transfer: 11/19/20  Discharge Plan and Services        DME Arranged: N/A DME Agency: NA  Readmission Risk Interventions No flowsheet data found.

## 2020-11-19 NOTE — Progress Notes (Signed)
Patient discharged to [facility] via [place]. All belongings w/ patient. Report called to [facility].

## 2020-11-19 NOTE — Discharge Summary (Signed)
Physician Discharge Summary  [name]. BUL:[id number] DOB: [birth year] DOA: 11/11/2020  PCP: [name], MD  Admit date: 11/11/2020 Discharge date: 11/19/2020  Admitted From: Home  Disposition: [facility]   Recommendations for Outpatient Follow-up:  Follow up with PCP in 1-2 weeks Continue Percocet as needed for pain control Discontinued NPH in favor of insulin glargine 8 units subcutaneously daily Continue to monitor INR over the next 5 days daily and adjust Coumadin as needed. If INR remains difficult to manage due to his poor dietary habits, may need to consider transitioning to NOAC if able  Discharge Condition: Stable CODE STATUS: Full code Diet recommendation: Heart healthy/consistent carbohydrate diet  History of present illness:  [name]. is a 80 year old male with past medical history significant for atrial flutter s/p DCCV, CAD, adrenal adenoma, CKD stage IIIa, chronic diastolic congestive heart failure, type 2 diabetes mellitus, depression, GERD, essential hypertension, OSA, multiple falls with recent L2 compression fracture treated with TLSO brace and pain medication who presents to [facility] ED on 11/7 with continued low back pain following recurrent fall.   In the ED, afebrile, BP 160/107, creatinine 1.52, WBC 16.2, INR 3.4, COVID-19 PCR/influenza A/B PCR negative.  CT head/C-spine with no acute intracranial abnormality and no cervical fracture/listhesis.  CT L-spine showed interval progression of compression deformity L2 with vertebral body now approximately 6% height loss with approximately 2 mm retropulsion into spinal canal of the superior endplate.  Hip x-rays with no acute findings.  IR was consulted for kyphoplasty.  Duration consulted for further evaluation and management of acute pain secondary to recurrent falls with lumbar compression fracture.  Hospital course:  L2 compression fracture Patient presenting to ED with progressive low back pain  following recurrent falls at home.  CT L-spine notable for interval progression of L2 compression fracture now with 50% height loss with 2 mm retropulsion into the spinal canal of the superior endplate.  Patient without bowel or bladder incontinence and no weakness.  Patient underwent kyphoplasty L2 segment by interventional radiology, Dr. Serafina Royals on 11/14/2020. Continue TLSO brace. Oxycodone-acetaminophen 5-325 mg q4h PRN moderate pain.  Discharging to SNF for further rehabilitation.   Essential hypertension Carvedilol 3.125 mg twice daily   Paroxysmal atrial fibrillation s/P MAZE procedure Hx atrial flutter s/p DCCV On anticoagulation with Coumadin outpatient with goal INR 2-3. Carvedilol 3.125 mg twice daily.  Recommend holding Coumadin until 11/20/2020 with repeat INR.  Recommend INR checks over the next 5 days.  If INR remains labile to control may need to consider transitioning to NOAC given his dietary habits.   Chronic diastolic congestive heart failure, compensated Home medications include metolazone 5 mg p.o. daily as needed, carvedilol 3.125 mg p.o. twice daily, torsemide 150 mg p.o. daily.  Recommend continue to monitor daily weights.   Supratherapeutic INR:  On Coumadin outpatient for A. fib/flutter.  INR 3.4 on admission, s/p 1 dose of vitamin K IV.  INR remains labile to control, reduce Coumadin to 5 mg p.o. daily.  Recommending holding Coumadin until 11/20/2020 after repeat INR outpatient.  Recommend monitoring INR over the next 5 days.  May need to consider transitioning to NOAC if INR remains labile to control.   CKD stage IIIa Creatinine 1.33 on discharge, stable (baseline 1.6-2.2)   Hypokalemia: Repleted during hospitalization.   Hyperlipidemia On atorvastatin 20 mg 3 x weekly on Monday/Wednesday/Friday.   Type 2 diabetes mellitus Home regimen includes NPH 70/30 45 units [place] daily with breakfast and Farxiga 5  mg p.o. daily.  Hemoglobin A1c 6.3, well controlled.  Continue  Farxiga 5 mg p.o. daily.  Discontinued NPH insulin for now as been on insulin glargine 8 units [place] daily during hospitalization and will continue.  Continue to monitor and adjust as needed.   Diabetic neuropathy: Gabapentin 300 mg p.o. twice daily   Hx gout: Allopurinol 300 mg p.o. daily   Anxiety/depression: Zoloft 1 mg p.o. 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   Type 2 diabetes mellitus with stage 4 chronic kidney disease, with long-term current use of insulin (HCC)   GERD   Essential hypertension   OSA on CPAP   S/P Maze operation for atrial fibrillation   Pulmonary hypertension ([place])   CKD stage 3 due to type 2 diabetes mellitus (HCC)   Atrial fibrillation, chronic (HCC)   Closed compression fracture of L2 lumbar vertebra, initial encounter [facility])    Discharge Instructions  Discharge Instructions     Call MD for:  difficulty breathing, headache or visual disturbances   Complete by: As directed    Call MD for:  extreme fatigue   Complete by: As directed    Call MD for:  persistant dizziness or light-headedness   Complete by: As directed    Call MD for:  persistant nausea and vomiting   Complete by: As directed    Call MD for:  severe uncontrolled pain   Complete by: As directed    Call MD for:  temperature >100.4   Complete by: As directed    Diet - low sodium heart healthy   Complete by: As directed    Increase activity slowly   Complete by: As directed       Allergies as of 11/19/2020       Reactions   Sunflower Oil Swelling, Other (See Comments)   Tongue and lip swelling Other reaction(s): Other Tongue and lip swelling   Horse-derived Products Other (See Comments)   Per allergy skin test UNSPECIFIED REACTION  Other reaction(s): Other Per allergy skin  test UNSPECIFIED REACTION    Other Other (See Comments)   Tetanus Shot -- skin test reaction   Tetanus Toxoids Other (See Comments)   Per allergy skin test   Tetanus Toxoid    Other reaction(s): Other (See Comments) Rash(horse serum) Other reaction(s): Other Rash(horse serum)        Medication List     STOP taking these medications    ALPRAZolam 1 MG tablet Commonly known as: XANAX   colchicine 0.6 MG tablet   cyclobenzaprine 10 MG tablet Commonly known as: FLEXERIL   dexamethasone 4 MG tablet Commonly known as: DECADRON   insulin NPH-regular Human (70-30) 100 UNIT/ML injection   oxyCODONE 5 MG immediate release tablet Commonly known as: Roxicodone       TAKE these medications    Accu-Chek Aviva Plus test strip Generic drug: glucose blood CHECK BLOOD GLUCOSE 3 TIMES DAILY What changed: See the new instructions.   Accu-Chek Aviva Plus w/Device Kit Check blood sugar 1 time  daily What changed: See the new instructions.   Blood Glucose Monitoring Suppl Devi Test blood sugar up to three times a day or as directed. What changed: Another medication with the same name was changed. Make sure you understand how and when to take each.  acetaminophen 500 MG tablet Commonly known as: TYLENOL Take 1,000 mg by mouth every 6 (six) hours as needed for moderate pain or headache.   allopurinol 300 MG tablet Commonly known as: ZYLOPRIM Take 300 mg by mouth daily.   atorvastatin 20 MG tablet Commonly known as: LIPITOR Take  1 tablet  3 x /week  for Cholesterol What changed:  how much to take how to take this when to take this additional instructions   azelastine 0.1 % nasal spray Commonly known as: ASTELIN Place 2 sprays into both nostrils 2 (two) times daily as needed for rhinitis or allergies.   carvedilol 3.125 MG tablet Commonly known as: COREG Take 1 tablet (3.125 mg total) by mouth 2 (two) times daily with a meal. What changed:  medication  strength See the new instructions.   dapagliflozin propanediol 5 MG Tabs tablet Commonly known as: Farxiga Take 1 tablet (5 mg total) by mouth daily before breakfast.   Dexcom G6 Receiver Devi Use to check blood sugar multiple times a day for Hypoglycemia and Hyperglycemia   Dexcom G6 Sensor Misc Use to check blood sugar multiple times a day for Hypoglycemia and Hyperglycemia   Dexcom G6 Transmitter Misc Use to check blood sugar multiple times a day for Hypoglycemia and Hyperglycemia   fluticasone 50 MCG/ACT nasal spray Commonly known as: FLONASE Place 1 spray into both nostrils daily as needed for allergies.   gabapentin 600 MG tablet Commonly known as: Neurontin Take  1/2 to 1 tablet  2 to 3 x /Daily  as needed for Pain What changed:  how much to take how to take this when to take this reasons to take this additional instructions   insulin glargine-yfgn 100 UNIT/ML injection Commonly known as: SEMGLEE Inject 0.08 mLs (8 Units total) into the skin daily. Start taking on: November 20, 2020   loratadine 10 MG tablet Commonly known as: CLARITIN Take 10 mg by mouth daily as needed for allergies.   metolazone 5 MG tablet Commonly known as: ZAROXOLYN Take 5 mg by mouth daily as needed (edema).   oxyCODONE-acetaminophen 5-325 MG tablet Commonly known as: PERCOCET/ROXICET Take 1 tablet by mouth every 4 (four) hours as needed for moderate pain or severe pain.   polyvinyl alcohol 1.4 % ophthalmic solution Commonly known as: LIQUIFILM TEARS Place 1 drop into both eyes daily as needed for dry eyes.   potassium chloride SA 20 MEQ tablet Commonly known as: KLOR-CON TAKE 2 TABLETS BY MOUTH 3  TIMES DAILY FOR POTASSIUM What changed:  how much to take how to take this when to take this additional instructions   PreserVision AREDS 2 Caps Take 1 capsule by mouth 2 (two) times daily.   senna-docusate 8.6-50 MG tablet Commonly known as: Senokot-S Take 1 tablet by mouth  daily as needed for up to 20 doses for mild constipation. Take with roxicodone to prevent constipation   sertraline 100 MG tablet Commonly known as: ZOLOFT TAKE 1 TABLET BY MOUTH  DAILY FOR MOOD What changed:  how much to take how to take this when to take this additional instructions   torsemide 100 MG tablet Commonly known as: DEMADEX Take 150 mg by mouth daily.   VITAMIN B COMPLEX PO Take 1 tablet by mouth at bedtime.   vitamin C 1000 MG tablet Take 1,000 mg by mouth daily.   VITAMIN D PO Take 5,000 Units by mouth daily.   warfarin 5 MG tablet Commonly known as: COUMADIN Take 1 tablet (5 mg  total) by mouth daily at 4 PM. Start taking on: November 20, 2020 What changed:  how much to take how to take this when to take this additional instructions   zinc gluconate 50 MG tablet Take 50 mg by mouth daily.        Contact information for follow-up providers     [place], [place]. Schedule an appointment as soon as possible for a visit in 1 week.   Specialty: Neurosurgery Contact information: [place] [address] [phone]              Contact information for after-discharge care     Destination     [facility] .   Service: Skilled Nursing Contact information: [place] [address] [phone]                    Allergies  Allergen Reactions   Sunflower Oil Swelling and Other (See Comments)    Tongue and lip swelling Other reaction(s): Other Tongue and lip swelling   Horse-Derived Products Other (See Comments)    Per allergy skin test UNSPECIFIED REACTION  Other reaction(s): Other Per allergy skin test UNSPECIFIED REACTION    Other Other (See Comments)    Tetanus Shot -- skin test reaction   Tetanus Toxoids Other (See Comments)    Per allergy skin test   Tetanus Toxoid     Other reaction(s): Other (See Comments) Rash(horse  serum) Other reaction(s): Other Rash(horse serum)    Consultations: Interventional radiology   Procedures/Studies: CT HEAD WO CONTRAST (5MM)  Result Date: 11/11/2020 CLINICAL DATA:  Status post fall from bed last night. Posterior neck pain. Generalized weakness. EXAM: CT HEAD WITHOUT CONTRAST CT CERVICAL SPINE WITHOUT CONTRAST TECHNIQUE: Multidetector CT imaging of the head and cervical spine was performed following the standard protocol without intravenous contrast. Multiplanar CT image reconstructions of the cervical spine were also generated. COMPARISON:  None. FINDINGS: CT HEAD FINDINGS Brain: There is no evidence for acute hemorrhage, hydrocephalus, mass lesion, or abnormal extra-axial fluid collection. No definite CT evidence for acute infarction. Diffuse loss of parenchymal volume is consistent with atrophy. Patchy low attenuation in the deep hemispheric and periventricular white matter is nonspecific, but likely reflects chronic microvascular ischemic demyelination. Vascular: No hyperdense vessel or unexpected calcification. Skull: No evidence for fracture. No worrisome lytic or sclerotic lesion. Sinuses/Orbits: Chronic mucosal disease noted in the left frontal, left ethmoid, and left maxillary sinuses, potentially related to a ostiomeatal complex disease. Mastoid air cells are clear bilaterally. Visualized portions of the globes and intraorbital fat are unremarkable. Other: None. CT CERVICAL SPINE FINDINGS Alignment: Straightening of normal cervical lordosis evident. Trace anterolisthesis of C4 on 5 likely related to the loss of facet space at the same level. Skull base and vertebrae: No acute fracture. No primary bone lesion or focal pathologic process. Soft tissues and spinal canal: No prevertebral fluid or swelling. No visible canal hematoma. Disc levels: Loss of disc height noted C5-6 and C6-7 as well as C7-T1. Upper chest: Pleural thickening noted medial left lung apex (84/4). Other: None.  IMPRESSION: 1. No acute intracranial abnormality. Atrophy with chronic small vessel white matter ischemic disease. 2. 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 No evidence for cervical spine fracture. Loss of normal cervical lordosis. This can be related to patient positioning, muscle spasm or soft tissue injury. 4. Degenerative disc changes in the cervical spine. 5.  Pleural thickening medial left lung apex. This should be further assessed with dedicated CT chest without contrast. Electronically Signed   By: Misty Stanley M.D.   On: 11/11/2020 13:02   CT Cervical Spine Wo Contrast  Result Date: 11/11/2020 CLINICAL DATA:  Status post fall from bed last night. Posterior neck pain. Generalized weakness. EXAM: CT HEAD WITHOUT CONTRAST CT CERVICAL SPINE WITHOUT CONTRAST TECHNIQUE: Multidetector CT imaging of the head and cervical spine was performed following the standard protocol without intravenous contrast. Multiplanar CT image reconstructions of the cervical spine were also generated. COMPARISON:  None. FINDINGS: CT HEAD FINDINGS Brain: There is no evidence for acute hemorrhage, hydrocephalus, mass lesion, or abnormal extra-axial fluid collection. No definite CT evidence for acute infarction. Diffuse loss of parenchymal volume is consistent with atrophy. Patchy low attenuation in the deep hemispheric and periventricular white matter is nonspecific, but likely reflects chronic microvascular ischemic demyelination. Vascular: No hyperdense vessel or unexpected calcification. Skull: No evidence for fracture. No worrisome lytic or sclerotic lesion. Sinuses/Orbits: Chronic mucosal disease noted in the left frontal, left ethmoid, and left maxillary sinuses, potentially related to a ostiomeatal complex disease. Mastoid air cells are clear bilaterally. Visualized portions of the globes and intraorbital fat are unremarkable. Other: None. CT CERVICAL SPINE FINDINGS  Alignment: Straightening of normal cervical lordosis evident. Trace anterolisthesis of C4 on 5 likely related to the loss of facet space at the same level. Skull base and vertebrae: No acute fracture. No primary bone lesion or focal pathologic process. Soft tissues and spinal canal: No prevertebral fluid or swelling. No visible canal hematoma. Disc levels: Loss of disc height noted C5-6 and C6-7 as well as C7-T1. Upper chest: Pleural thickening noted medial left lung apex (84/4). Other: None. IMPRESSION: 1. No acute intracranial abnormality. Atrophy with chronic small vessel white matter ischemic disease. 2. 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 No evidence for cervical spine fracture. Loss of normal cervical lordosis. This can be related to patient positioning, muscle spasm or soft tissue injury. 4. Degenerative disc changes in the cervical spine. 5. Pleural thickening medial left lung apex. This should be further assessed with dedicated CT chest without contrast. Electronically Signed   By: Misty Stanley M.D.   On: 11/11/2020 13:02   CT Lumbar Spine Wo Contrast  Result Date: 11/11/2020 CLINICAL DATA:  Recent L2 vertebral body fracture, fall last night EXAM: CT LUMBAR SPINE WITHOUT CONTRAST TECHNIQUE: Multidetector CT imaging of the lumbar spine was performed without intravenous contrast administration. Multiplanar CT image reconstructions were also generated. COMPARISON:  CT examination dated November 02, 2020 FINDINGS: Segmentation: 5 lumbar type vertebrae. Alignment: Grade 1 anterolisthesis of L4 Vertebrae: Interval progression of the compression deformity of L 2 vertebral body now approximately 60% vertebral body height loss. There is approximately 2 mm retropulsion into the spinal canal of the superior endplate. Diffuse osteopenia. No aggressive osseous. Paraspinal and other soft tissues: Mild soft tissue swelling tissue edema on the anterior aspect of  the L2 vertebral body Disc levels: Mild multilevel degenerate disc disease with disc protrusion prominent at L4 and L5 with associated facet joint arthropathy. IMPRESSION: 1. Interval progression of compression deformity of L2 vertebral body now approximately 60% vertebral body height loss and with approximately 2 mm retropulsion into the spinal canal of the superior endplate. 2.  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 Electronically Signed   By: Keane Police D.O.   On: 11/11/2020 13:36   CT L-SPINE NO CHARGE  Result  Date: 11/02/2020 CLINICAL DATA:  Back pain for 1 week.  No known injury. EXAM: CT LUMBAR SPINE WITHOUT CONTRAST TECHNIQUE: Multidetector CT imaging of the lumbar spine was performed without intravenous contrast administration. Multiplanar CT image reconstructions were also generated. COMPARISON:  08/08/2014 FINDINGS: Segmentation: 5 lumbar type vertebrae. Alignment: Grade 1 anterolisthesis of L4 on L5 secondary to facet disease. Vertebrae: L2 vertebral body compression fracture with approximately 20% central height loss. Generalized osteopenia. No discitis or osteomyelitis. No aggressive osseous lesion. Paraspinal and other soft tissues: Abdominal aortic atherosclerosis. Other: Mild osteoarthritis of bilateral SI joints. Disc levels: Disc spaces: Degenerative disease with mild disc height loss at L1-2. T12-L1: No disc protrusion, foraminal stenosis or central canal stenosis. L1-L2: No disc protrusion, foraminal stenosis or central canal stenosis. L2-L3: Mild broad-based disc bulge. No foraminal or central canal stenosis. L3-L4: Mild broad-based disc bulge. Mild bilateral facet arthropathy. No foraminal or central canal stenosis. L4-L5: Mild broad-based disc bulge. Severe bilateral facet arthropathy. Severe spinal stenosis. No foraminal stenosis. L5-S1: Mild broad-based disc bulge. Mild bilateral facet arthropathy. No foraminal or central canal stenosis. IMPRESSION: 1. Acute L2  vertebral body compression fracture with approximately 20% height loss. Electronically Signed   By: Kathreen Devoid M.D.   On: 11/02/2020 17:13   DG Chest Port 1 View  Result Date: 11/07/2020 CLINICAL DATA:  Weakness, hypoglycemia EXAM: PORTABLE CHEST 1 VIEW COMPARISON:  06/09/2020 FINDINGS: Cardiomegaly. Chronic pulmonary vascular congestion/cephalization. No focal consolidation. No pleural effusion or pneumothorax. Postsurgical changes related to prior CABG.  Median sternotomy. IMPRESSION: No evidence of acute cardiopulmonary disease. Electronically Signed   By: Julian Hy M.D.   On: 11/07/2020 03:58   IR KYPHO LUMBAR INC FX REDUCE BONE BX UNI/BIL CANNULATION INC/IMAGING  Result Date: 11/14/2020 CLINICAL DATA:  80 year old male with acute superior endplate compression fracture of L2 vertebral body. EXAM: FLUOROSCOPIC GUIDED KYPHOPLASTY OF THE L2 VERTEBRAL BODY COMPARISON:  11/11/2020, 11/02/2020 MEDICATIONS: As antibiotic prophylaxis, Ancef 2 gm IV was ordered pre-procedure and administered intravenously within 1 hour of incision. ANESTHESIA/SEDATION: Moderate (conscious) sedation was employed during this procedure. A total of Versed 1.5 mg and Fentanyl 100 mcg was administered intravenously. Moderate Sedation Time: 40 minutes. The patient's level of consciousness and vital signs were monitored continuously by radiology nursing throughout the procedure under my direct supervision. FLUOROSCOPY TIME:  Five min, 54 seconds (254 mGy) COMPLICATIONS: None immediate. TECHNIQUE: The procedure, risks (including but not limited to bleeding, infection, organ damage), benefits, and alternatives were explained to the patient. Questions regarding the procedure were encouraged and answered. The patient understands and consents to the procedure. The patient was placed prone on the fluoroscopic table. The skin overlying the lumbar region was then prepped and draped in the usual sterile fashion. Maximal barrier sterile  technique was utilized including caps, mask, sterile gowns, sterile gloves, sterile drape, hand hygiene and skin antiseptic. Intravenous Fentanyl and Versed were administered as conscious sedation during continuous cardiorespiratory monitoring by the radiology RN. The left pedicle at L2 was then infiltrated with 1% lidocaine followed by the advancement of a Kyphon trocar needle through the left pedicle into the posterior one-third of the vertebral body. Subsequently, the osteo drill was advanced to the anterior third of the vertebral body. The osteo drill was retracted. Through the working cannula, a Kyphon inflatable bone tamp 15 x 3 was advanced and positioned with the distal marker approximately 5 mm from the anterior aspect of the cortex. Appropriate positioning was confirmed on the AP projection. At this time,  the balloon was expanded using contrast via a Kyphon inflation syringe device via micro tubing. In similar fashion, the right L2 pedicle was infiltrated with 1% lidocaine followed by the advancement of a second Kyphon trocar needle through the right pedicle into the posterior third of the vertebral body. Subsequently, the osteo drill was coaxially advanced to the anterior right third. The osteo drill was exchanged for a Kyphon inflatable bone tamp 15 x 3, advanced to the 5 mm of the anterior aspect of the cortex. The balloon was then expanded using contrast as above. Inflations were continued until there was near apposition with the superior end plate. At this time, methylmethacrylate mixture was reconstituted in the Kyphon bone mixing device system. This was then loaded into the delivery mechanism, attached to Kyphon bone fillers. The balloons were deflated and removed followed by the instillation of methylmethacrylate mixture with excellent filling in the AP and lateral projections. No extravasation was noted in the disk spaces or posteriorly into the spinal canal. No epidural venous contamination was  seen. The working cannulae and the bone filler were then retrieved and removed. Hemostasis was achieved with manual compression. The patient tolerated the procedure well without immediate postprocedural complication. IMPRESSION: 1. Technically successful L2 vertebral body augmentation using balloon kyphoplasty. 2. Per CMS PQRS reporting requirements (PQRS Measure 24): Given the patient's age of greater than 46 and the fracture site (hip, distal radius, or spine), the patient should be tested for osteoporosis using DXA, and the appropriate treatment considered based on the DXA results. Ruthann Cancer, MD [facility] Electronically Signed   By: Ruthann Cancer M.D.   On: 11/14/2020 15:16   CT RENAL STONE STUDY  Result Date: 11/02/2020 CLINICAL DATA:  80 year old male with acute abdominal and flank pain. EXAM: CT ABDOMEN AND PELVIS WITHOUT CONTRAST TECHNIQUE: Multidetector CT imaging of the abdomen and pelvis was performed following the standard protocol without IV contrast. COMPARISON:  08/08/2014 CT and prior studies FINDINGS: Please note that parenchymal/vascular abnormalities may be missed is intravenous contrast was not administered. Lower chest: A trace RIGHT pleural effusion is noted. Bibasilar scarring again noted. Hepatobiliary: The liver and gallbladder are unremarkable. No biliary dilatation. 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 There is no evidence of hydronephrosis or urinary calculi. Stomach/Bowel: Stomach is within normal limits. No evidence of bowel wall thickening, distention, or inflammatory changes. Vascular/Lymphatic: Aortic atherosclerosis. No enlarged abdominal or pelvic lymph nodes. Reproductive: Prostate is unremarkable. Other: A trace amount of free fluid within the  pelvis is identified. No focal collection or pneumoperitoneum. Musculoskeletal: 35% SUPERIOR endplate compression fracture of L2 is age indeterminate but new since 2016. Grade 1 anterolisthesis of L4 on 5 is again noted. IMPRESSION: 1. 35% SUPERIOR endplate compression fracture of L2, age indeterminate but new since 2016. 2. Trace amount of nonspecific free pelvic fluid and trace RIGHT pleural effusion. 3. Aortic Atherosclerosis (ICD10-I70.0). Electronically Signed   By: Margarette Canada M.D.   On: 11/02/2020 17:12   DG Hip Unilat W or Wo Pelvis 2-3 Views Left  Result Date: 11/11/2020 CLINICAL DATA:  Status post fall. EXAM: DG HIP (WITH OR WITHOUT PELVIS) 2-3V LEFT COMPARISON:  None. FINDINGS: There is no evidence of hip fracture or dislocation. 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 IMPRESSION: 1. No acute findings. 2. Mild bilateral and symmetric osteoarthritis. Electronically Signed  By: Kerby Moors M.D.   On: 11/11/2020 12:53     Subjective: Patient seen examined at bedside, resting comfortably.  Eating breakfast.  Pain well controlled.  Discharging to rehab today.  No other questions or concerns at this time.  Denies headache, no fever/chills/night sweats, no nausea/vomiting/diarrhea, no chest pain, no palpitations, no shortness of breath, no abdominal pain, no weakness, no fatigue, no paresthesias.  No acute events overnight per nursing staff.  Discharge Exam: Vitals:   11/19/20 0518 11/19/20 1012  BP: (!) 143/82   Pulse: 68 70  Resp: 16   Temp: 98.3 F (36.8 C)   SpO2: 94%    Vitals:   11/18/20 1358 11/18/20 1933 11/19/20 0518 11/19/20 1012  BP: (!) 96/52 123/86 (!) 143/82   Pulse: 61 82 68 70  Resp: 17 16 16    Temp: 98 F (36.7 C) 98.1 F (36.7 C) 98.3 F (36.8 C)   TempSrc:  Oral Oral   SpO2:  96% 94%   Weight:      Height:        General: Pt is alert, awake, not in acute distress, elderly in appearance Cardiovascular: RRR, S1/S2 +, no rubs, no  gallops Respiratory: CTA bilaterally, no wheezing, no rhonchi, on room air Abdominal: Soft, NT, ND, bowel sounds + Extremities: no edema, no cyanosis    The results of significant diagnostics from this hospitalization (including imaging, microbiology, ancillary and laboratory) are listed below for reference.     Microbiology: Recent Results (from the past 240 hour(s))  Resp Panel by RT-PCR (Flu A&B, Covid) Nasopharyngeal Swab     Status: None   Collection Time: 11/11/20  5:50 PM   Specimen: Nasopharyngeal Swab; Nasopharyngeal(NP) swabs in vial transport medium  Result Value Ref Range Status   SARS Coronavirus 2 by RT PCR NEGATIVE NEGATIVE Final    Comment: (NOTE) SARS-CoV-2 target nucleic acids are NOT DETECTED.  The SARS-CoV-2 RNA is generally detectable in upper respiratory specimens during the acute phase of infection. The lowest concentration of SARS-CoV-2 viral copies this assay can detect is 138 copies/mL. A negative result does not preclude SARS-Cov-2 infection and should not be used as the sole basis for treatment or other patient management decisions. A negative result may occur with  improper specimen collection/handling, submission of specimen other than nasopharyngeal swab, presence of viral mutation(s) within the areas targeted by this assay, and inadequate number of viral copies(<138 copies/mL). A negative result must be combined with clinical observations, patient history, and epidemiological information. The expected result is Negative.  Fact Sheet for Patients:  EntrepreneurPulse.com.au  Fact Sheet for Healthcare Providers:  IncredibleEmployment.be  This test is no t yet approved or cleared by the [place] FDA and  has been authorized for detection and/or diagnosis of SARS-CoV-2 by FDA under an Emergency Use Authorization (EUA). This EUA will remain  in effect (meaning this test can be used) for the duration of  the COVID-19 declaration under Section 564(b)(1) of the Act, 21 U.S.C.section 360bbb-3(b)(1), unless the authorization is terminated  or revoked sooner.       Influenza A by PCR NEGATIVE NEGATIVE Final   Influenza B by PCR NEGATIVE NEGATIVE Final    Comment: (NOTE) The Xpert Xpress SARS-CoV-2/FLU/RSV plus assay is intended as an aid in the diagnosis of influenza from Nasopharyngeal swab specimens and should not be used as a sole basis for treatment. Nasal washings and aspirates are unacceptable for Xpert Xpress SARS-CoV-2/FLU/RSV testing.  Fact Sheet for Patients: EntrepreneurPulse.com.au  Fact Sheet for Healthcare Providers: IncredibleEmployment.be  This test is not yet approved or cleared by the [place] FDA and has been authorized for detection and/or diagnosis of SARS-CoV-2 by FDA under an Emergency Use Authorization (EUA). This EUA will remain in effect (meaning this test can be used) for the duration of the COVID-19 declaration under Section 564(b)(1) of the Act, 21 U.S.C. section 360bbb-3(b)(1), unless the authorization is terminated or revoked.  Performed at [facility], [place] [address], [place], [place] [address]   Resp Panel by RT-PCR (Flu A&B, Covid) Nasopharyngeal Swab     Status: None   Collection Time: 11/18/20 12:55 PM   Specimen: Nasopharyngeal Swab; Nasopharyngeal(NP) swabs in vial transport medium  Result Value Ref Range Status   SARS Coronavirus 2 by RT PCR NEGATIVE NEGATIVE Final    Comment: (NOTE) SARS-CoV-2 target nucleic acids are NOT DETECTED.  The SARS-CoV-2 RNA is generally detectable in upper respiratory specimens during the acute phase of infection. The lowest concentration of SARS-CoV-2 viral copies this assay can detect is 138 copies/mL. A negative result does not preclude SARS-Cov-2 infection and should not be used as the sole basis for treatment or other patient management  decisions. A negative result may occur with  improper specimen collection/handling, submission of specimen other than nasopharyngeal swab, presence of viral mutation(s) within the areas targeted by this assay, and inadequate number of viral copies(<138 copies/mL). A negative result must be combined with clinical observations, patient history, and epidemiological information. The expected result is Negative.  Fact Sheet for Patients:  EntrepreneurPulse.com.au  Fact Sheet for Healthcare Providers:  IncredibleEmployment.be  This test is no t yet approved or cleared by the [place] FDA and  has been authorized for detection and/or diagnosis of SARS-CoV-2 by FDA under an Emergency Use Authorization (EUA). This EUA will remain  in effect (meaning this test can be used) for the duration of the COVID-19 declaration under Section 564(b)(1) of the Act, 21 U.S.C.section 360bbb-3(b)(1), unless the authorization is terminated  or revoked sooner.       Influenza A by PCR NEGATIVE NEGATIVE Final   Influenza B by PCR NEGATIVE NEGATIVE Final    Comment: (NOTE) The Xpert Xpress SARS-CoV-2/FLU/RSV plus assay is intended as an aid in the diagnosis of influenza from Nasopharyngeal swab specimens and should not be used as a sole basis for treatment. Nasal washings and aspirates are unacceptable for Xpert Xpress SARS-CoV-2/FLU/RSV testing.  Fact Sheet for Patients: EntrepreneurPulse.com.au  Fact Sheet for Healthcare Providers: IncredibleEmployment.be  This test is not yet approved or cleared by the [place] FDA and has been authorized for detection and/or diagnosis of SARS-CoV-2 by FDA under an Emergency Use Authorization (EUA). This EUA will remain in effect (meaning this test can be used) for the duration of the COVID-19 declaration under Section 564(b)(1) of the Act, 21 U.S.C. section 360bbb-3(b)(1), unless the  authorization is terminated or revoked.  Performed at [facility], [place] [address] [place], [place] [address]      Labs: BNP (last 3 results) Recent Labs    09/13/20 1242  BNP 827.0*   Basic Metabolic Panel: Recent Labs  Lab 11/13/20 0321 11/14/20 0308 11/15/20 0524 11/17/20 0336  NA 134* 135 135 138  K 4.7 4.7 4.4 3.5  CL 105 106 104 99  CO2 22 20* 19* 25  GLUCOSE 99 161* 165* 156*  BUN 35* 39* 42* 44*  CREATININE 1.51* 1.38* 1.50* 1.33*  CALCIUM 8.5* 8.5* 8.8* 8.9  Liver Function Tests: No results for input(s): AST, ALT, ALKPHOS, BILITOT, PROT, ALBUMIN in the last 168 hours. No results for input(s): LIPASE, AMYLASE in the last 168 hours. No results for input(s): AMMONIA in the last 168 hours. CBC: Recent Labs  Lab 11/13/20 0321 11/14/20 0308 11/16/20 0737 11/17/20 0336  WBC 12.2* 12.3* 13.0* 12.6*  HGB 12.3* 13.1 11.7* 14.7  HCT 39.6 42.0 36.5* 45.0  MCV 97.8 98.4 97.3 95.1  PLT 198 195 192 231   Cardiac Enzymes: No results for input(s): CKTOTAL, CKMB, CKMBINDEX, TROPONINI in the last 168 hours. BNP: Invalid input(s): POCBNP CBG: Recent Labs  Lab 11/18/20 1130 11/18/20 1731 11/18/20 2000 11/19/20 0743 11/19/20 1007  GLUCAP 264* 183* 262* 167* 230*   D-Dimer No results for input(s): DDIMER in the last 72 hours. Hgb A1c No results for input(s): HGBA1C in the last 72 hours. Lipid Profile No results for input(s): CHOL, HDL, LDLCALC, TRIG, CHOLHDL, LDLDIRECT in the last 72 hours. Thyroid function studies No results for input(s): TSH, T4TOTAL, T3FREE, THYROIDAB in the last 72 hours.  Invalid input(s): FREET3 Anemia work up No results for input(s): VITAMINB12, FOLATE, FERRITIN, TIBC, IRON, RETICCTPCT in the last 72 hours. Urinalysis    Component Value Date/Time   COLORURINE YELLOW 11/12/2020 1325   APPEARANCEUR CLEAR 11/12/2020 1325   LABSPEC 1.017 11/12/2020 1325   PHURINE 5.0 11/12/2020 1325   GLUCOSEU 150 (A)  11/12/2020 1325   GLUCOSEU 500 06/04/2010 1626   HGBUR NEGATIVE 11/12/2020 1325   BILIRUBINUR NEGATIVE 11/12/2020 1325   KETONESUR NEGATIVE 11/12/2020 1325   PROTEINUR NEGATIVE 11/12/2020 1325   UROBILINOGEN 0.2 03/05/2014 1627   NITRITE NEGATIVE 11/12/2020 1325   LEUKOCYTESUR NEGATIVE 11/12/2020 1325   Sepsis Labs Invalid input(s): PROCALCITONIN,  WBC,  LACTICIDVEN Microbiology Recent Results (from the past 240 hour(s))  Resp Panel by RT-PCR (Flu A&B, Covid) Nasopharyngeal Swab     Status: None   Collection Time: 11/11/20  5:50 PM   Specimen: Nasopharyngeal Swab; Nasopharyngeal(NP) swabs in vial transport medium  Result Value Ref Range Status   SARS Coronavirus 2 by RT PCR NEGATIVE NEGATIVE Final    Comment: (NOTE) SARS-CoV-2 target nucleic acids are NOT DETECTED.  The SARS-CoV-2 RNA is generally detectable in upper respiratory specimens during the acute phase of infection. The lowest concentration of SARS-CoV-2 viral copies this assay can detect is 138 copies/mL. A negative result does not preclude SARS-Cov-2 infection and should not be used as the sole basis for treatment or other patient management decisions. A negative result may occur with  improper specimen collection/handling, submission of specimen other than nasopharyngeal swab, presence of viral mutation(s) within the areas targeted by this assay, and inadequate number of viral copies(<138 copies/mL). A negative result must be combined with clinical observations, patient history, and epidemiological information. The expected result is Negative.  Fact Sheet for Patients:  EntrepreneurPulse.com.au  Fact Sheet for Healthcare Providers:  IncredibleEmployment.be  This test is no t yet approved or cleared by the [place] FDA and  has been authorized for detection and/or diagnosis of SARS-CoV-2 by FDA under an Emergency Use Authorization (EUA). This EUA will remain  in effect  (meaning this test can be used) for the duration of the COVID-19 declaration under Section 564(b)(1) of the Act, 21 U.S.C.section 360bbb-3(b)(1), unless the authorization is terminated  or revoked sooner.       Influenza A by PCR NEGATIVE NEGATIVE Final   Influenza B by PCR NEGATIVE NEGATIVE Final    Comment: (NOTE) The  Xpert Xpress SARS-CoV-2/FLU/RSV plus assay is intended as an aid in the diagnosis of influenza from Nasopharyngeal swab specimens and should not be used as a sole basis for treatment. Nasal washings and aspirates are unacceptable for Xpert Xpress SARS-CoV-2/FLU/RSV testing.  Fact Sheet for Patients: EntrepreneurPulse.com.au  Fact Sheet for Healthcare Providers: IncredibleEmployment.be  This test is not yet approved or cleared by the [place] FDA and has been authorized for detection and/or diagnosis of SARS-CoV-2 by FDA under an Emergency Use Authorization (EUA). This EUA will remain in effect (meaning this test can be used) for the duration of the COVID-19 declaration under Section 564(b)(1) of the Act, 21 U.S.C. section 360bbb-3(b)(1), unless the authorization is terminated or revoked.  Performed at [facility], [place] [address] [place], [place] [address]   Resp Panel by RT-PCR (Flu A&B, Covid) Nasopharyngeal Swab     Status: None   Collection Time: 11/18/20 12:55 PM   Specimen: Nasopharyngeal Swab; Nasopharyngeal(NP) swabs in vial transport medium  Result Value Ref Range Status   SARS Coronavirus 2 by RT PCR NEGATIVE NEGATIVE Final    Comment: (NOTE) SARS-CoV-2 target nucleic acids are NOT DETECTED.  The SARS-CoV-2 RNA is generally detectable in upper respiratory specimens during the acute phase of infection. The lowest concentration of SARS-CoV-2 viral copies this assay can detect is 138 copies/mL. A negative result does not preclude SARS-Cov-2 infection and should not be used as the sole basis  for treatment or other patient management decisions. A negative result may occur with  improper specimen collection/handling, submission of specimen other than nasopharyngeal swab, presence of viral mutation(s) within the areas targeted by this assay, and inadequate number of viral copies(<138 copies/mL). A negative result must be combined with clinical observations, patient history, and epidemiological information. The expected result is Negative.  Fact Sheet for Patients:  EntrepreneurPulse.com.au  Fact Sheet for Healthcare Providers:  IncredibleEmployment.be  This test is no t yet approved or cleared by the [place] FDA and  has been authorized for detection and/or diagnosis of SARS-CoV-2 by FDA under an Emergency Use Authorization (EUA). This EUA will remain  in effect (meaning this test can be used) for the duration of the COVID-19 declaration under Section 564(b)(1) of the Act, 21 U.S.C.section 360bbb-3(b)(1), unless the authorization is terminated  or revoked sooner.       Influenza A by PCR NEGATIVE NEGATIVE Final   Influenza B by PCR NEGATIVE NEGATIVE Final    Comment: (NOTE) The Xpert Xpress SARS-CoV-2/FLU/RSV plus assay is intended as an aid in the diagnosis of influenza from Nasopharyngeal swab specimens and should not be used as a sole basis for treatment. Nasal washings and aspirates are unacceptable for Xpert Xpress SARS-CoV-2/FLU/RSV testing.  Fact Sheet for Patients: EntrepreneurPulse.com.au  Fact Sheet for Healthcare Providers: IncredibleEmployment.be  This test is not yet approved or cleared by the [place] FDA and has been authorized for detection and/or diagnosis of SARS-CoV-2 by FDA under an Emergency Use Authorization (EUA). This EUA will remain in effect (meaning this test can be used) for the duration of the COVID-19 declaration under Section 564(b)(1) of the Act, 21  U.S.C. section 360bbb-3(b)(1), unless the authorization is terminated or revoked.  Performed at [facility], [place] [address] [place], [place] [address]      Time coordinating discharge: Over 30 minutes  SIGNED:   Dante Roudebush J [place] DO  [facility] 11/19/2020, 10:33 AM

## 2020-11-19 NOTE — Progress Notes (Addendum)
[place] [facility] for warfarin Indication: atrial fibrillation   Allergies  Allergen Reactions   Sunflower Oil Swelling and Other (See Comments)    Tongue and lip swelling Other reaction(s): Other Tongue and lip swelling   Horse-Derived Products Other (See Comments)    Per allergy skin test UNSPECIFIED REACTION  Other reaction(s): Other Per allergy skin test UNSPECIFIED REACTION    Other Other (See Comments)    Tetanus Shot -- skin test reaction   Tetanus Toxoids Other (See Comments)    Per allergy skin test   Tetanus Toxoid     Other reaction(s): Other (See Comments) Rash(horse serum) Other reaction(s): Other Rash(horse serum)    Patient Measurements: Height: 5\' 11"  (180.3 cm) Weight: 73.3 kg (161 lb 9.6 oz) IBW/kg (Calculated) : 75.3   Vital Signs: Temp: 98.3 F (36.8 C) (11/15 0518) Temp Source: Oral (11/15 0518) BP: 143/82 (11/15 0518) Pulse Rate: 68 (11/15 0518)  Labs: Recent Labs    11/16/20 0737 11/17/20 0336 11/18/20 0325 11/19/20 0314  HGB 11.7* 14.7  --   --   HCT 36.5* 45.0  --   --   PLT 192 231  --   --   LABPROT 21.5* 23.7* 27.8* 38.6*  INR 1.9* 2.1* 2.6* 4.0*  CREATININE  --  1.33*  --   --      Estimated Creatinine Clearance: 45.2 mL/min (A) (by C-G formula based on SCr of 1.33 mg/dL (H)).  Medications:  PTA Warfarin  Assessment: 12 yoM admitted with compression fracture of the L2 lumbar vertebra s/p fall on 11/02/20. Bridged with enoxaparin for kyphoplasty which occurred on 11/10; now bridging back to warfarin per Rx dosing. PTA warfarin 7.5mg  daily except 10mg  on Sundays/Thursdays; LD 11/6 INR elevated on admission  Today, 11/19/2020: Last CBC on 11/13 was WNL INR supratherapeutic at 4 (rapid increase from 2.6 yesterday)  Variable meal intake: 20-50% of meals in past 24 hours (likely culprit for high INR) DDI: allopurinol may increase INR (allopurinol continued from outpatient) No bridging needed  per Hospitalist No signs of bleeding per Hospitalist  Goal of Therapy:  INR 2-3 Monitor platelets by anticoagulation protocol: Yes   Plan:  Hold warfarin tonight given rapid increase in INR Daily INR; CBC as needed Monitor for signs of bleeding or thrombosis Plan to discharge to SNF today, recommendations for warfarin are as follows: Recheck INR 11/16, would continue daily checks for 3-5 days pending INR response Variable INR likely attributed to poor PO intake If INR < 3, could resume warfarin 5mg  or 7.5mg  PO x1 (pending PO intake) Monitor PO intake - if consistently < 50% will likely require further dose reduction These recommendations may require adjustment pending INR response and PO intake at [facility]   Dimple Nanas, PharmD 11/19/2020,7:26 AM

## 2020-11-22 DIAGNOSIS — E1159 Type 2 diabetes mellitus with other circulatory complications: Secondary | ICD-10-CM | POA: Diagnosis not present

## 2020-11-22 DIAGNOSIS — I480 Paroxysmal atrial fibrillation: Secondary | ICD-10-CM | POA: Diagnosis not present

## 2020-11-22 DIAGNOSIS — R296 Repeated falls: Secondary | ICD-10-CM | POA: Diagnosis not present

## 2020-11-22 DIAGNOSIS — N183 Chronic kidney disease, stage 3 unspecified: Secondary | ICD-10-CM | POA: Diagnosis not present

## 2020-11-22 DIAGNOSIS — I152 Hypertension secondary to endocrine disorders: Secondary | ICD-10-CM | POA: Diagnosis not present

## 2020-11-22 DIAGNOSIS — S32000A Wedge compression fracture of unspecified lumbar vertebra, initial encounter for closed fracture: Secondary | ICD-10-CM | POA: Diagnosis not present

## 2020-11-22 DIAGNOSIS — I484 Atypical atrial flutter: Secondary | ICD-10-CM | POA: Diagnosis not present

## 2020-11-22 DIAGNOSIS — M109 Gout, unspecified: Secondary | ICD-10-CM | POA: Diagnosis not present

## 2020-11-22 DIAGNOSIS — Z794 Long term (current) use of insulin: Secondary | ICD-10-CM | POA: Diagnosis not present

## 2020-11-22 DIAGNOSIS — I5032 Chronic diastolic (congestive) heart failure: Secondary | ICD-10-CM | POA: Diagnosis not present

## 2020-11-22 DIAGNOSIS — I2510 Atherosclerotic heart disease of native coronary artery without angina pectoris: Secondary | ICD-10-CM | POA: Diagnosis not present

## 2020-11-22 DIAGNOSIS — E785 Hyperlipidemia, unspecified: Secondary | ICD-10-CM | POA: Diagnosis not present

## 2020-11-25 DIAGNOSIS — N183 Chronic kidney disease, stage 3 unspecified: Secondary | ICD-10-CM | POA: Diagnosis not present

## 2020-11-25 DIAGNOSIS — G4733 Obstructive sleep apnea (adult) (pediatric): Secondary | ICD-10-CM | POA: Diagnosis not present

## 2020-11-25 DIAGNOSIS — S32020D Wedge compression fracture of second lumbar vertebra, subsequent encounter for fracture with routine healing: Secondary | ICD-10-CM | POA: Diagnosis not present

## 2020-11-25 DIAGNOSIS — I5032 Chronic diastolic (congestive) heart failure: Secondary | ICD-10-CM | POA: Diagnosis not present

## 2020-11-25 DIAGNOSIS — I480 Paroxysmal atrial fibrillation: Secondary | ICD-10-CM | POA: Diagnosis not present

## 2020-11-26 DIAGNOSIS — S32020D Wedge compression fracture of second lumbar vertebra, subsequent encounter for fracture with routine healing: Secondary | ICD-10-CM | POA: Diagnosis not present

## 2020-11-26 DIAGNOSIS — M5386 Other specified dorsopathies, lumbar region: Secondary | ICD-10-CM | POA: Diagnosis not present

## 2020-11-26 DIAGNOSIS — E1159 Type 2 diabetes mellitus with other circulatory complications: Secondary | ICD-10-CM | POA: Diagnosis not present

## 2020-12-02 ENCOUNTER — Telehealth: Payer: Self-pay

## 2020-12-02 DIAGNOSIS — E1159 Type 2 diabetes mellitus with other circulatory complications: Secondary | ICD-10-CM | POA: Diagnosis not present

## 2020-12-02 DIAGNOSIS — M5386 Other specified dorsopathies, lumbar region: Secondary | ICD-10-CM | POA: Diagnosis not present

## 2020-12-02 DIAGNOSIS — Z7901 Long term (current) use of anticoagulants: Secondary | ICD-10-CM | POA: Diagnosis not present

## 2020-12-02 NOTE — Telephone Encounter (Signed)
Spoke with patient about setting up a hospital follow up, he is still in rehab. States that he will call our office when he gets home.

## 2020-12-03 DIAGNOSIS — M5386 Other specified dorsopathies, lumbar region: Secondary | ICD-10-CM | POA: Diagnosis not present

## 2020-12-03 DIAGNOSIS — D72829 Elevated white blood cell count, unspecified: Secondary | ICD-10-CM | POA: Diagnosis not present

## 2020-12-03 DIAGNOSIS — S32020D Wedge compression fracture of second lumbar vertebra, subsequent encounter for fracture with routine healing: Secondary | ICD-10-CM | POA: Diagnosis not present

## 2020-12-03 DIAGNOSIS — E1159 Type 2 diabetes mellitus with other circulatory complications: Secondary | ICD-10-CM | POA: Diagnosis not present

## 2020-12-06 DIAGNOSIS — S32020D Wedge compression fracture of second lumbar vertebra, subsequent encounter for fracture with routine healing: Secondary | ICD-10-CM | POA: Diagnosis not present

## 2020-12-06 DIAGNOSIS — N183 Chronic kidney disease, stage 3 unspecified: Secondary | ICD-10-CM | POA: Diagnosis not present

## 2020-12-06 DIAGNOSIS — M5386 Other specified dorsopathies, lumbar region: Secondary | ICD-10-CM | POA: Diagnosis not present

## 2020-12-10 ENCOUNTER — Other Ambulatory Visit: Payer: Self-pay | Admitting: Student

## 2020-12-10 ENCOUNTER — Ambulatory Visit: Payer: Self-pay | Admitting: Pharmacist

## 2020-12-10 DIAGNOSIS — M25552 Pain in left hip: Secondary | ICD-10-CM | POA: Diagnosis not present

## 2020-12-10 DIAGNOSIS — S32020A Wedge compression fracture of second lumbar vertebra, initial encounter for closed fracture: Secondary | ICD-10-CM | POA: Diagnosis not present

## 2020-12-10 DIAGNOSIS — M5386 Other specified dorsopathies, lumbar region: Secondary | ICD-10-CM | POA: Diagnosis not present

## 2020-12-10 DIAGNOSIS — I5032 Chronic diastolic (congestive) heart failure: Secondary | ICD-10-CM | POA: Diagnosis not present

## 2020-12-10 DIAGNOSIS — E1159 Type 2 diabetes mellitus with other circulatory complications: Secondary | ICD-10-CM | POA: Diagnosis not present

## 2020-12-11 ENCOUNTER — Encounter (HOSPITAL_COMMUNITY): Payer: Self-pay | Admitting: Radiology

## 2020-12-17 DIAGNOSIS — I4891 Unspecified atrial fibrillation: Secondary | ICD-10-CM | POA: Diagnosis not present

## 2020-12-19 DIAGNOSIS — I480 Paroxysmal atrial fibrillation: Secondary | ICD-10-CM | POA: Diagnosis not present

## 2020-12-19 DIAGNOSIS — U071 COVID-19: Secondary | ICD-10-CM | POA: Diagnosis not present

## 2020-12-19 DIAGNOSIS — I5032 Chronic diastolic (congestive) heart failure: Secondary | ICD-10-CM | POA: Diagnosis not present

## 2020-12-19 DIAGNOSIS — I152 Hypertension secondary to endocrine disorders: Secondary | ICD-10-CM | POA: Diagnosis not present

## 2020-12-19 DIAGNOSIS — Z91199 Patient's noncompliance with other medical treatment and regimen due to unspecified reason: Secondary | ICD-10-CM | POA: Diagnosis not present

## 2020-12-24 ENCOUNTER — Telehealth: Payer: Self-pay | Admitting: Hematology

## 2020-12-26 DIAGNOSIS — U071 COVID-19: Secondary | ICD-10-CM | POA: Diagnosis not present

## 2020-12-26 DIAGNOSIS — I152 Hypertension secondary to endocrine disorders: Secondary | ICD-10-CM | POA: Diagnosis not present

## 2020-12-26 DIAGNOSIS — I5032 Chronic diastolic (congestive) heart failure: Secondary | ICD-10-CM | POA: Diagnosis not present

## 2020-12-26 DIAGNOSIS — E1159 Type 2 diabetes mellitus with other circulatory complications: Secondary | ICD-10-CM | POA: Diagnosis not present

## 2020-12-26 DIAGNOSIS — I480 Paroxysmal atrial fibrillation: Secondary | ICD-10-CM | POA: Diagnosis not present

## 2020-12-27 ENCOUNTER — Ambulatory Visit: Payer: Medicare Other

## 2020-12-27 ENCOUNTER — Other Ambulatory Visit: Payer: Medicare Other

## 2020-12-30 ENCOUNTER — Other Ambulatory Visit: Payer: Medicare Other

## 2020-12-30 ENCOUNTER — Inpatient Hospital Stay: Admission: RE | Admit: 2020-12-30 | Payer: Medicare Other | Source: Ambulatory Visit

## 2021-01-01 ENCOUNTER — Inpatient Hospital Stay: Payer: Medicare Other

## 2021-01-05 NOTE — Telephone Encounter (Signed)
per 12/20 secure chat to r/s due to overbooking, attempted to call x2 and left 1 message on pt mobile phone

## 2021-01-05 DEATH — deceased

## 2021-01-21 ENCOUNTER — Ambulatory Visit: Payer: Medicare Other | Admitting: Adult Health

## 2021-02-26 ENCOUNTER — Ambulatory Visit: Payer: Medicare Other | Admitting: Adult Health

## 2021-03-26 ENCOUNTER — Encounter: Payer: Self-pay | Admitting: Hematology

## 2021-04-01 ENCOUNTER — Other Ambulatory Visit: Payer: Self-pay | Admitting: *Deleted

## 2021-04-01 DIAGNOSIS — D509 Iron deficiency anemia, unspecified: Secondary | ICD-10-CM

## 2021-04-02 ENCOUNTER — Inpatient Hospital Stay: Payer: Medicare Other | Admitting: Hematology

## 2021-04-02 ENCOUNTER — Ambulatory Visit: Payer: Medicare Other | Admitting: Hematology

## 2021-04-02 ENCOUNTER — Inpatient Hospital Stay: Payer: Medicare Other

## 2021-04-02 ENCOUNTER — Other Ambulatory Visit: Payer: Medicare Other

## 2021-04-02 ENCOUNTER — Ambulatory Visit: Payer: Medicare Other

## 2021-04-02 ENCOUNTER — Inpatient Hospital Stay: Payer: Self-pay | Attending: Hematology

## 2021-10-23 ENCOUNTER — Encounter: Payer: Medicare Other | Admitting: Internal Medicine
